# Patient Record
Sex: Female | Born: 1965 | Race: Black or African American | Hispanic: No | Marital: Single | Smoking: Never smoker
Health system: Southern US, Community
[De-identification: ages and names within clinical notes are randomized; demographics above are authoritative.]

## PROBLEM LIST (undated history)

## (undated) ENCOUNTER — Ambulatory Visit (HOSPITAL_COMMUNITY): Disposition: A | Discharge status: Home / Self Care | Payer: No Typology Code available for payment source

## (undated) DIAGNOSIS — E1142 Type 2 diabetes mellitus with diabetic polyneuropathy: Secondary | ICD-10-CM

## (undated) DIAGNOSIS — M7530 Calcific tendinitis of unspecified shoulder: Secondary | ICD-10-CM

## (undated) DIAGNOSIS — M51379 Other intervertebral disc degeneration, lumbosacral region without mention of lumbar back pain or lower extremity pain: Secondary | ICD-10-CM

## (undated) DIAGNOSIS — I5032 Chronic diastolic (congestive) heart failure: Secondary | ICD-10-CM

## (undated) DIAGNOSIS — M545 Low back pain, unspecified: Secondary | ICD-10-CM

## (undated) DIAGNOSIS — Z950 Presence of cardiac pacemaker: Secondary | ICD-10-CM

## (undated) DIAGNOSIS — G4733 Obstructive sleep apnea (adult) (pediatric): Secondary | ICD-10-CM

## (undated) DIAGNOSIS — R0602 Shortness of breath: Secondary | ICD-10-CM

## (undated) DIAGNOSIS — R569 Unspecified convulsions: Secondary | ICD-10-CM

## (undated) DIAGNOSIS — E785 Hyperlipidemia, unspecified: Secondary | ICD-10-CM

## (undated) DIAGNOSIS — IMO0002 Reserved for concepts with insufficient information to code with codable children: Secondary | ICD-10-CM

## (undated) DIAGNOSIS — E119 Type 2 diabetes mellitus without complications: Secondary | ICD-10-CM

## (undated) DIAGNOSIS — J45909 Unspecified asthma, uncomplicated: Secondary | ICD-10-CM

## (undated) DIAGNOSIS — K219 Gastro-esophageal reflux disease without esophagitis: Secondary | ICD-10-CM

## (undated) DIAGNOSIS — I447 Left bundle-branch block, unspecified: Secondary | ICD-10-CM

## (undated) DIAGNOSIS — M1990 Unspecified osteoarthritis, unspecified site: Secondary | ICD-10-CM

## (undated) DIAGNOSIS — Z95810 Presence of automatic (implantable) cardiac defibrillator: Secondary | ICD-10-CM

## (undated) DIAGNOSIS — I5042 Chronic combined systolic (congestive) and diastolic (congestive) heart failure: Secondary | ICD-10-CM

## (undated) DIAGNOSIS — K769 Liver disease, unspecified: Secondary | ICD-10-CM

## (undated) DIAGNOSIS — I10 Essential (primary) hypertension: Secondary | ICD-10-CM

## (undated) DIAGNOSIS — Z9989 Dependence on other enabling machines and devices: Secondary | ICD-10-CM

## (undated) DIAGNOSIS — G8929 Other chronic pain: Secondary | ICD-10-CM

## (undated) DIAGNOSIS — M169 Osteoarthritis of hip, unspecified: Secondary | ICD-10-CM

## (undated) DIAGNOSIS — I2510 Atherosclerotic heart disease of native coronary artery without angina pectoris: Secondary | ICD-10-CM

## (undated) DIAGNOSIS — N390 Urinary tract infection, site not specified: Secondary | ICD-10-CM

## (undated) DIAGNOSIS — I639 Cerebral infarction, unspecified: Secondary | ICD-10-CM

## (undated) DIAGNOSIS — I428 Other cardiomyopathies: Secondary | ICD-10-CM

## (undated) DIAGNOSIS — M5137 Other intervertebral disc degeneration, lumbosacral region: Secondary | ICD-10-CM

## (undated) HISTORY — DX: Calcific tendinitis of unspecified shoulder: M75.30

## (undated) HISTORY — DX: Reserved for concepts with insufficient information to code with codable children: IMO0002

## (undated) HISTORY — DX: Essential (primary) hypertension: I10

## (undated) HISTORY — DX: Low back pain, unspecified: M54.50

## (undated) HISTORY — DX: Morbid (severe) obesity due to excess calories: E66.01

## (undated) HISTORY — DX: Other chronic pain: G89.29

## (undated) HISTORY — DX: Dependence on other enabling machines and devices: Z99.89

## (undated) HISTORY — DX: Other intervertebral disc degeneration, lumbosacral region without mention of lumbar back pain or lower extremity pain: M51.379

## (undated) HISTORY — DX: Chronic diastolic (congestive) heart failure: I50.32

## (undated) HISTORY — DX: Osteoarthritis of hip, unspecified: M16.9

## (undated) HISTORY — DX: Hyperlipidemia, unspecified: E78.5

## (undated) HISTORY — DX: Obstructive sleep apnea (adult) (pediatric): G47.33

## (undated) HISTORY — DX: Liver disease, unspecified: K76.9

## (undated) HISTORY — PX: CARPAL TUNNEL RELEASE: SHX101

## (undated) HISTORY — DX: Other cardiomyopathies: I42.8

## (undated) HISTORY — DX: Type 2 diabetes mellitus with diabetic polyneuropathy: E11.42

## (undated) HISTORY — DX: Other intervertebral disc degeneration, lumbosacral region: M51.37

## (undated) HISTORY — DX: Gastro-esophageal reflux disease without esophagitis: K21.9

## (undated) HISTORY — PX: MULTIPLE TOOTH EXTRACTIONS: SHX2053

## (undated) HISTORY — DX: Left bundle-branch block, unspecified: I44.7

## (undated) HISTORY — DX: Low back pain: M54.5

---
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
## 1985-05-31 HISTORY — PX: TUBAL LIGATION: SHX77

## 1998-06-29 ENCOUNTER — Emergency Department (HOSPITAL_COMMUNITY): Admission: EM | Admit: 1998-06-29 | Discharge: 1998-06-29 | Payer: Self-pay | Admitting: Emergency Medicine

## 1999-07-31 ENCOUNTER — Emergency Department (HOSPITAL_COMMUNITY): Admission: EM | Admit: 1999-07-31 | Discharge: 1999-07-31 | Payer: Self-pay | Admitting: Emergency Medicine

## 1999-10-21 ENCOUNTER — Emergency Department (HOSPITAL_COMMUNITY): Admission: EM | Admit: 1999-10-21 | Discharge: 1999-10-21 | Payer: Self-pay | Admitting: Emergency Medicine

## 1999-10-27 ENCOUNTER — Encounter: Payer: Self-pay | Admitting: Oral and Maxillofacial Surgery

## 1999-10-27 ENCOUNTER — Emergency Department (HOSPITAL_COMMUNITY): Admission: EM | Admit: 1999-10-27 | Discharge: 1999-10-27 | Payer: Self-pay | Admitting: *Deleted

## 1999-10-27 ENCOUNTER — Ambulatory Visit (HOSPITAL_COMMUNITY): Admission: RE | Admit: 1999-10-27 | Discharge: 1999-10-27 | Payer: Self-pay | Admitting: Oral and Maxillofacial Surgery

## 1999-10-28 ENCOUNTER — Ambulatory Visit: Admission: RE | Admit: 1999-10-28 | Discharge: 1999-10-28 | Payer: Self-pay | Admitting: Emergency Medicine

## 1999-12-18 ENCOUNTER — Encounter: Payer: Self-pay | Admitting: Emergency Medicine

## 1999-12-18 ENCOUNTER — Emergency Department (HOSPITAL_COMMUNITY): Admission: EM | Admit: 1999-12-18 | Discharge: 1999-12-18 | Payer: Self-pay | Admitting: Emergency Medicine

## 2000-11-24 ENCOUNTER — Emergency Department (HOSPITAL_COMMUNITY): Admission: EM | Admit: 2000-11-24 | Discharge: 2000-11-24 | Payer: Self-pay

## 2000-11-24 ENCOUNTER — Encounter: Payer: Self-pay | Admitting: Emergency Medicine

## 2003-12-17 ENCOUNTER — Emergency Department (HOSPITAL_COMMUNITY): Admission: EM | Admit: 2003-12-17 | Discharge: 2003-12-17 | Payer: Self-pay | Admitting: Emergency Medicine

## 2004-03-31 ENCOUNTER — Emergency Department (HOSPITAL_COMMUNITY): Admission: EM | Admit: 2004-03-31 | Discharge: 2004-03-31 | Payer: Self-pay | Admitting: Family Medicine

## 2004-04-12 ENCOUNTER — Emergency Department (HOSPITAL_COMMUNITY): Admission: EM | Admit: 2004-04-12 | Discharge: 2004-04-13 | Payer: Self-pay | Admitting: Emergency Medicine

## 2004-04-18 ENCOUNTER — Emergency Department (HOSPITAL_COMMUNITY): Admission: EM | Admit: 2004-04-18 | Discharge: 2004-04-19 | Payer: Self-pay | Admitting: Emergency Medicine

## 2004-04-30 ENCOUNTER — Ambulatory Visit (HOSPITAL_COMMUNITY): Admission: RE | Admit: 2004-04-30 | Discharge: 2004-04-30 | Payer: Self-pay | Admitting: Specialist

## 2004-07-09 ENCOUNTER — Emergency Department (HOSPITAL_COMMUNITY): Admission: EM | Admit: 2004-07-09 | Discharge: 2004-07-09 | Payer: Self-pay | Admitting: Emergency Medicine

## 2004-12-05 ENCOUNTER — Emergency Department (HOSPITAL_COMMUNITY): Admission: EM | Admit: 2004-12-05 | Discharge: 2004-12-06 | Payer: Self-pay | Admitting: Emergency Medicine

## 2005-05-06 ENCOUNTER — Emergency Department (HOSPITAL_COMMUNITY): Admission: EM | Admit: 2005-05-06 | Discharge: 2005-05-07 | Payer: Self-pay | Admitting: Emergency Medicine

## 2005-05-07 ENCOUNTER — Inpatient Hospital Stay (HOSPITAL_COMMUNITY): Admission: AD | Admit: 2005-05-07 | Discharge: 2005-05-07 | Payer: Self-pay | Admitting: Obstetrics & Gynecology

## 2005-05-27 ENCOUNTER — Ambulatory Visit: Payer: Self-pay | Admitting: Obstetrics and Gynecology

## 2005-08-10 ENCOUNTER — Emergency Department (HOSPITAL_COMMUNITY): Admission: EM | Admit: 2005-08-10 | Discharge: 2005-08-10 | Payer: Self-pay | Admitting: Family Medicine

## 2006-01-07 ENCOUNTER — Emergency Department (HOSPITAL_COMMUNITY): Admission: EM | Admit: 2006-01-07 | Discharge: 2006-01-07 | Payer: Self-pay | Admitting: Family Medicine

## 2006-01-22 ENCOUNTER — Emergency Department (HOSPITAL_COMMUNITY): Admission: EM | Admit: 2006-01-22 | Discharge: 2006-01-23 | Payer: Self-pay | Admitting: Emergency Medicine

## 2006-01-24 ENCOUNTER — Ambulatory Visit: Payer: Self-pay | Admitting: *Deleted

## 2006-01-24 ENCOUNTER — Emergency Department (HOSPITAL_COMMUNITY): Admission: EM | Admit: 2006-01-24 | Discharge: 2006-01-25 | Payer: Self-pay | Admitting: Emergency Medicine

## 2006-01-24 ENCOUNTER — Ambulatory Visit: Payer: Self-pay | Admitting: Family Medicine

## 2006-01-26 ENCOUNTER — Ambulatory Visit (HOSPITAL_COMMUNITY): Admission: RE | Admit: 2006-01-26 | Discharge: 2006-01-26 | Payer: Self-pay | Admitting: Family Medicine

## 2006-01-27 ENCOUNTER — Ambulatory Visit: Payer: Self-pay | Admitting: Family Medicine

## 2006-02-03 ENCOUNTER — Ambulatory Visit: Payer: Self-pay | Admitting: Family Medicine

## 2006-02-12 ENCOUNTER — Emergency Department (HOSPITAL_COMMUNITY): Admission: EM | Admit: 2006-02-12 | Discharge: 2006-02-12 | Payer: Self-pay | Admitting: Pediatrics

## 2006-02-13 ENCOUNTER — Emergency Department (HOSPITAL_COMMUNITY): Admission: EM | Admit: 2006-02-13 | Discharge: 2006-02-13 | Payer: Self-pay | Admitting: Pediatrics

## 2006-02-14 ENCOUNTER — Inpatient Hospital Stay (HOSPITAL_COMMUNITY): Admission: EM | Admit: 2006-02-14 | Discharge: 2006-02-16 | Payer: Self-pay | Admitting: Emergency Medicine

## 2006-02-14 ENCOUNTER — Ambulatory Visit: Payer: Self-pay | Admitting: Infectious Diseases

## 2006-04-11 ENCOUNTER — Ambulatory Visit: Payer: Self-pay | Admitting: Family Medicine

## 2006-04-12 ENCOUNTER — Emergency Department (HOSPITAL_COMMUNITY): Admission: EM | Admit: 2006-04-12 | Discharge: 2006-04-12 | Payer: Self-pay | Admitting: Emergency Medicine

## 2006-04-13 ENCOUNTER — Emergency Department (HOSPITAL_COMMUNITY): Admission: EM | Admit: 2006-04-13 | Discharge: 2006-04-13 | Payer: Self-pay | Admitting: Emergency Medicine

## 2006-04-22 ENCOUNTER — Ambulatory Visit: Payer: Self-pay | Admitting: Family Medicine

## 2006-05-10 ENCOUNTER — Emergency Department (HOSPITAL_COMMUNITY): Admission: EM | Admit: 2006-05-10 | Discharge: 2006-05-10 | Payer: Self-pay | Admitting: Emergency Medicine

## 2006-10-03 ENCOUNTER — Ambulatory Visit: Payer: Self-pay | Admitting: Family Medicine

## 2006-12-23 ENCOUNTER — Ambulatory Visit: Payer: Self-pay | Admitting: Nurse Practitioner

## 2007-02-07 ENCOUNTER — Ambulatory Visit: Payer: Self-pay | Admitting: Family Medicine

## 2007-04-18 ENCOUNTER — Ambulatory Visit: Payer: Self-pay | Admitting: Family Medicine

## 2007-04-18 ENCOUNTER — Encounter (INDEPENDENT_AMBULATORY_CARE_PROVIDER_SITE_OTHER): Payer: Self-pay | Admitting: Family Medicine

## 2007-08-18 ENCOUNTER — Ambulatory Visit: Payer: Self-pay | Admitting: Family Medicine

## 2007-08-30 ENCOUNTER — Encounter (INDEPENDENT_AMBULATORY_CARE_PROVIDER_SITE_OTHER): Payer: Self-pay | Admitting: *Deleted

## 2007-10-08 ENCOUNTER — Emergency Department (HOSPITAL_COMMUNITY): Admission: EM | Admit: 2007-10-08 | Discharge: 2007-10-08 | Payer: Self-pay | Admitting: Emergency Medicine

## 2007-11-12 ENCOUNTER — Emergency Department (HOSPITAL_COMMUNITY): Admission: EM | Admit: 2007-11-12 | Discharge: 2007-11-12 | Payer: Self-pay | Admitting: Emergency Medicine

## 2007-11-14 ENCOUNTER — Ambulatory Visit: Payer: Self-pay | Admitting: *Deleted

## 2007-11-14 ENCOUNTER — Inpatient Hospital Stay (HOSPITAL_COMMUNITY): Admission: EM | Admit: 2007-11-14 | Discharge: 2007-11-16 | Payer: Self-pay | Admitting: Emergency Medicine

## 2007-12-12 ENCOUNTER — Ambulatory Visit: Payer: Self-pay | Admitting: Family Medicine

## 2007-12-12 LAB — CONVERTED CEMR LAB
AST: 26 units/L (ref 0–37)
BUN: 7 mg/dL (ref 6–23)
Calcium: 9.8 mg/dL (ref 8.4–10.5)
Chloride: 104 meq/L (ref 96–112)
Free T4: 1.31 ng/dL (ref 0.89–1.80)
Glucose, Bld: 155 mg/dL — ABNORMAL HIGH (ref 70–99)
Sodium: 134 meq/L — ABNORMAL LOW (ref 135–145)
TSH: 2.298 microintl units/mL (ref 0.350–5.50)
Total Protein: 8.2 g/dL (ref 6.0–8.3)

## 2008-05-10 ENCOUNTER — Ambulatory Visit: Payer: Self-pay | Admitting: Family Medicine

## 2008-05-17 ENCOUNTER — Emergency Department (HOSPITAL_COMMUNITY): Admission: EM | Admit: 2008-05-17 | Discharge: 2008-05-18 | Payer: Self-pay | Admitting: Emergency Medicine

## 2008-05-22 ENCOUNTER — Emergency Department (HOSPITAL_COMMUNITY): Admission: EM | Admit: 2008-05-22 | Discharge: 2008-05-23 | Payer: Self-pay | Admitting: Emergency Medicine

## 2008-05-23 ENCOUNTER — Emergency Department (HOSPITAL_COMMUNITY): Admission: EM | Admit: 2008-05-23 | Discharge: 2008-05-23 | Payer: Self-pay | Admitting: Family Medicine

## 2008-06-13 ENCOUNTER — Emergency Department (HOSPITAL_COMMUNITY): Admission: EM | Admit: 2008-06-13 | Discharge: 2008-06-13 | Payer: Self-pay | Admitting: Emergency Medicine

## 2008-06-18 ENCOUNTER — Emergency Department (HOSPITAL_COMMUNITY): Admission: EM | Admit: 2008-06-18 | Discharge: 2008-06-18 | Payer: Self-pay | Admitting: Emergency Medicine

## 2008-06-20 ENCOUNTER — Emergency Department (HOSPITAL_COMMUNITY): Admission: EM | Admit: 2008-06-20 | Discharge: 2008-06-20 | Payer: Self-pay | Admitting: Emergency Medicine

## 2008-07-12 ENCOUNTER — Ambulatory Visit: Payer: Self-pay | Admitting: Family Medicine

## 2008-09-03 ENCOUNTER — Ambulatory Visit: Payer: Self-pay | Admitting: Family Medicine

## 2008-09-11 ENCOUNTER — Ambulatory Visit: Payer: Self-pay | Admitting: Family Medicine

## 2009-01-02 ENCOUNTER — Ambulatory Visit: Payer: Self-pay | Admitting: Family Medicine

## 2009-01-08 ENCOUNTER — Emergency Department (HOSPITAL_COMMUNITY): Admission: EM | Admit: 2009-01-08 | Discharge: 2009-01-08 | Payer: Self-pay | Admitting: Emergency Medicine

## 2009-02-02 ENCOUNTER — Emergency Department (HOSPITAL_COMMUNITY): Admission: EM | Admit: 2009-02-02 | Discharge: 2009-02-02 | Payer: Self-pay | Admitting: Emergency Medicine

## 2009-02-04 ENCOUNTER — Ambulatory Visit: Payer: Self-pay | Admitting: Family Medicine

## 2009-02-04 LAB — CONVERTED CEMR LAB: Microalb, Ur: 1.03 mg/dL (ref 0.00–1.89)

## 2009-02-07 ENCOUNTER — Encounter: Admission: RE | Admit: 2009-02-07 | Discharge: 2009-02-07 | Payer: Self-pay | Admitting: General Practice

## 2009-02-12 ENCOUNTER — Ambulatory Visit (HOSPITAL_COMMUNITY): Admission: RE | Admit: 2009-02-12 | Discharge: 2009-02-12 | Payer: Self-pay | Admitting: Family Medicine

## 2009-03-17 ENCOUNTER — Emergency Department (HOSPITAL_COMMUNITY): Admission: EM | Admit: 2009-03-17 | Discharge: 2009-03-17 | Payer: Self-pay | Admitting: Emergency Medicine

## 2009-03-24 ENCOUNTER — Emergency Department (HOSPITAL_COMMUNITY): Admission: EM | Admit: 2009-03-24 | Discharge: 2009-03-24 | Payer: Self-pay | Admitting: *Deleted

## 2009-04-03 ENCOUNTER — Ambulatory Visit: Payer: Self-pay | Admitting: Family Medicine

## 2009-05-15 ENCOUNTER — Ambulatory Visit: Payer: Self-pay | Admitting: Internal Medicine

## 2009-05-30 ENCOUNTER — Emergency Department (HOSPITAL_COMMUNITY): Admission: EM | Admit: 2009-05-30 | Discharge: 2009-05-30 | Payer: Self-pay | Admitting: Emergency Medicine

## 2009-06-03 ENCOUNTER — Ambulatory Visit: Payer: Self-pay | Admitting: Internal Medicine

## 2009-08-05 ENCOUNTER — Ambulatory Visit: Payer: Self-pay | Admitting: Family Medicine

## 2009-08-05 LAB — CONVERTED CEMR LAB
AST: 16 units/L (ref 0–37)
Albumin: 4.3 g/dL (ref 3.5–5.2)
Calcium: 9.3 mg/dL (ref 8.4–10.5)
Chloride: 100 meq/L (ref 96–112)
Cholesterol: 212 mg/dL — ABNORMAL HIGH (ref 0–200)
Creatinine, Ser: 0.85 mg/dL (ref 0.40–1.20)
Glucose, Bld: 245 mg/dL — ABNORMAL HIGH (ref 70–99)
HDL: 49 mg/dL (ref >39)
LDL Cholesterol: 114 mg/dL — ABNORMAL HIGH (ref 0–99)
Potassium: 4.2 meq/L (ref 3.5–5.3)
Sodium: 137 meq/L (ref 135–145)
TSH: 1.441 microintl units/mL (ref 0.350–4.500)
Total Bilirubin: 0.7 mg/dL (ref 0.3–1.2)

## 2009-09-05 ENCOUNTER — Emergency Department (HOSPITAL_COMMUNITY): Admission: EM | Admit: 2009-09-05 | Discharge: 2009-09-05 | Payer: Self-pay | Admitting: Emergency Medicine

## 2009-09-26 ENCOUNTER — Emergency Department (HOSPITAL_COMMUNITY): Admission: EM | Admit: 2009-09-26 | Discharge: 2009-09-26 | Payer: Self-pay | Admitting: Emergency Medicine

## 2009-12-13 HISTORY — PX: BREAST SURGERY: SHX581

## 2009-12-31 ENCOUNTER — Emergency Department (HOSPITAL_COMMUNITY): Admission: EM | Admit: 2009-12-31 | Discharge: 2009-12-31 | Payer: Self-pay | Admitting: Emergency Medicine

## 2010-01-02 ENCOUNTER — Emergency Department (HOSPITAL_COMMUNITY): Admission: EM | Admit: 2010-01-02 | Discharge: 2010-01-02 | Payer: Self-pay | Admitting: Emergency Medicine

## 2010-02-04 ENCOUNTER — Inpatient Hospital Stay (HOSPITAL_COMMUNITY): Admission: AD | Admit: 2010-02-04 | Discharge: 2010-02-04 | Payer: Self-pay | Admitting: Obstetrics and Gynecology

## 2010-02-10 ENCOUNTER — Emergency Department (HOSPITAL_COMMUNITY): Admission: EM | Admit: 2010-02-10 | Discharge: 2010-02-11 | Payer: Self-pay | Admitting: Emergency Medicine

## 2010-02-12 ENCOUNTER — Ambulatory Visit: Payer: Self-pay | Admitting: Family Medicine

## 2010-02-13 ENCOUNTER — Encounter: Admission: RE | Admit: 2010-02-13 | Discharge: 2010-02-13 | Payer: Self-pay | Admitting: Family Medicine

## 2010-04-06 ENCOUNTER — Ambulatory Visit: Payer: Self-pay | Admitting: Internal Medicine

## 2010-04-14 ENCOUNTER — Ambulatory Visit: Payer: Self-pay | Admitting: Family Medicine

## 2010-04-28 ENCOUNTER — Ambulatory Visit: Payer: Self-pay | Admitting: Internal Medicine

## 2010-05-05 ENCOUNTER — Ambulatory Visit: Payer: Self-pay | Admitting: Family Medicine

## 2010-05-06 ENCOUNTER — Ambulatory Visit: Payer: Self-pay | Admitting: Internal Medicine

## 2010-07-11 ENCOUNTER — Observation Stay (HOSPITAL_COMMUNITY): Admission: EM | Admit: 2010-07-11 | Discharge: 2010-07-12 | Payer: Self-pay | Admitting: Emergency Medicine

## 2010-07-28 ENCOUNTER — Encounter: Payer: Self-pay | Admitting: Cardiology

## 2010-07-28 ENCOUNTER — Ambulatory Visit: Payer: Self-pay | Admitting: Family Medicine

## 2010-08-05 ENCOUNTER — Emergency Department (HOSPITAL_COMMUNITY): Admission: EM | Admit: 2010-08-05 | Discharge: 2010-08-06 | Payer: Self-pay | Admitting: Emergency Medicine

## 2010-08-07 ENCOUNTER — Emergency Department (HOSPITAL_COMMUNITY): Admission: EM | Admit: 2010-08-07 | Discharge: 2010-08-07 | Payer: Self-pay | Admitting: Emergency Medicine

## 2010-08-11 ENCOUNTER — Emergency Department (HOSPITAL_COMMUNITY): Admission: EM | Admit: 2010-08-11 | Discharge: 2010-08-11 | Payer: Self-pay | Admitting: Emergency Medicine

## 2010-08-14 ENCOUNTER — Emergency Department (HOSPITAL_COMMUNITY): Admission: EM | Admit: 2010-08-14 | Discharge: 2010-08-14 | Payer: Self-pay | Admitting: Emergency Medicine

## 2010-08-24 ENCOUNTER — Ambulatory Visit: Payer: Self-pay | Admitting: Internal Medicine

## 2010-08-27 ENCOUNTER — Ambulatory Visit: Payer: Self-pay | Admitting: Cardiology

## 2010-08-27 DIAGNOSIS — I10 Essential (primary) hypertension: Secondary | ICD-10-CM | POA: Insufficient documentation

## 2010-08-27 DIAGNOSIS — Z8679 Personal history of other diseases of the circulatory system: Secondary | ICD-10-CM | POA: Insufficient documentation

## 2010-09-07 ENCOUNTER — Telehealth (INDEPENDENT_AMBULATORY_CARE_PROVIDER_SITE_OTHER): Payer: Self-pay | Admitting: *Deleted

## 2010-09-08 ENCOUNTER — Ambulatory Visit: Payer: Self-pay

## 2010-09-08 ENCOUNTER — Ambulatory Visit: Payer: Self-pay | Admitting: Cardiology

## 2010-09-08 ENCOUNTER — Encounter: Payer: Self-pay | Admitting: Cardiology

## 2010-09-08 ENCOUNTER — Encounter (HOSPITAL_COMMUNITY): Admission: RE | Admit: 2010-09-08 | Discharge: 2010-09-18 | Payer: Self-pay | Admitting: Cardiology

## 2010-09-10 ENCOUNTER — Ambulatory Visit: Payer: Self-pay | Admitting: Cardiology

## 2010-09-15 ENCOUNTER — Ambulatory Visit: Payer: Self-pay | Admitting: Cardiology

## 2010-09-15 ENCOUNTER — Ambulatory Visit (HOSPITAL_COMMUNITY): Admission: RE | Admit: 2010-09-15 | Discharge: 2010-09-15 | Payer: Self-pay | Admitting: Cardiology

## 2010-09-15 ENCOUNTER — Encounter: Payer: Self-pay | Admitting: Cardiology

## 2010-09-15 ENCOUNTER — Ambulatory Visit: Payer: Self-pay

## 2010-11-29 ENCOUNTER — Emergency Department (HOSPITAL_COMMUNITY)
Admission: EM | Admit: 2010-11-29 | Discharge: 2010-11-29 | Payer: Self-pay | Source: Home / Self Care | Admitting: Emergency Medicine

## 2010-12-16 ENCOUNTER — Emergency Department (HOSPITAL_COMMUNITY)
Admission: EM | Admit: 2010-12-16 | Discharge: 2010-12-16 | Payer: Self-pay | Source: Home / Self Care | Admitting: Emergency Medicine

## 2010-12-18 ENCOUNTER — Emergency Department (HOSPITAL_COMMUNITY)
Admission: EM | Admit: 2010-12-18 | Discharge: 2010-12-19 | Payer: Self-pay | Source: Home / Self Care | Admitting: Emergency Medicine

## 2011-01-12 NOTE — Letter (Signed)
Summary: [facility] Note  [facility] Note   Imported By: Marylou Mccoy 09/14/2010 13:04:50  _____________________________________________________________________  External Attachment:    Type:   Image     Comment:   External Document

## 2011-01-12 NOTE — Assessment & Plan Note (Signed)
Summary: Cardiology Nuclear Testing  Nuclear Med Background Indications for Stress Test: Evaluation for Ischemia  Indications Comments: Admitted 07/11/10 CP (-) enzymes  History: GXT  History Comments: '96 GXT (-) [place]  Symptoms: Chest Pain, Fatigue, Fatigue with Exertion, Nausea, Palpitations, SOB    Nuclear Pre-Procedure Cardiac Risk Factors: Family History - CAD, Hypertension, NIDDM Caffeine/Decaff Intake: None NPO After: 7:00 PM Lungs: clear IV 0.9% NS with Angio Cath: 22g     IV Site: R Antecubital IV Started by: Irean Hong, RN Chest Size (in) 44     Cup Size D     Height (in): 69 Weight (lb): 288 BMI: 42.68 Tech Comments: Held carvedilol 24 hrs.  Nuclear Med Study 1 or 2 day study:  1 day     Stress Test Type:  Eugenie Birks Reading MD:  Willa Rough, MD     Referring MD:  D.McLean Resting Radionuclide:  Technetium 34m Tetrofosmin     Resting Radionuclide Dose:  11 mCi  Stress Radionuclide:  Technetium 13m Tetrofosmin     Stress Radionuclide Dose:  33 mCi   Stress Protocol   Lexiscan: 0.4 mg   Stress Test Technologist:  Frederick Peers, EMT-P     Nuclear Technologist:  Domenic Polite, CNMT  Rest Procedure  Myocardial perfusion imaging was performed at rest 45 minutes following the intravenous administration of Technetium 65m Tetrofosmin.  Stress Procedure  The patient received IV Lexiscan 0.4 mg over 15-seconds.  Technetium 46m Tetrofosmin injected at 30-seconds.  There were no significant changes with infusion.  Quantitative spect images were obtained after a 45 minute delay.  QPS Raw Data Images:  Normal; no motion artifact; normal heart/lung ratio. Stress Images:  Normal homogeneous uptake in all areas of the myocardium. Rest Images:  Normal homogeneous uptake in all areas of the myocardium. Subtraction (SDS):  No evidence of ischemia. Transient Ischemic Dilatation:  1.15  (Normal <1.22)  Lung/Heart Ratio:  .27  (Normal <0.45)  Quantitative Gated  Spect Images QGS EDV:  120 ml QGS ESV:  60 ml QGS EF:  50 % QGS cine images:  Good motion  Findings Normal nuclear study      Overall Impression  Exercise Capacity: Lexiscan with no exercise. BP Response: Hypertension at rest. Study changed to [place]. Clinical Symptoms: Chest discomfort ECG Impression: No significant ST segment change suggestive of ischemia. Overall Impression: Normal stress nuclear study.  Appended Document: Cardiology Nuclear Testing appt 09/10/10 with Dr Shirlee Latch

## 2011-01-12 NOTE — Assessment & Plan Note (Signed)
Summary: np6/CP/uncontrolled/dm/& htn/ neg enzymes  Medications Added VENTOLIN HFA 108 (90 BASE) MCG/ACT AERS (ALBUTEROL SULFATE) use 2 puffs four times daily METFORMIN HCL 1000 MG TABS (METFORMIN HCL) take one tablet by mouth two times a day AVAPRO 300 MG TABS (IRBESARTAN) take one tablet once daily NYSTATIN-TRIAMCINOLONE 100000-0.1 UNIT/GM-% CREA (NYSTATIN-TRIAMCINOLONE) apply to affected area twice daily CARVEDILOL 3.125 MG TABS (CARVEDILOL) take on tablet two times a day CELEXA 20 MG TABS (CITALOPRAM HYDROBROMIDE) once daily NITROSTAT 0.4 MG SUBL (NITROGLYCERIN) UAD CARDURA 4 MG TABS (DOXAZOSIN MESYLATE) take one tablet once daily ACTOS 15 MG TABS (PIOGLITAZONE HCL) take one tablet once daily CLOTRIMAZOLE 1 % CREA (CLOTRIMAZOLE) apply to groin creases at bedtime NORVASC 5 MG TABS (AMLODIPINE BESYLATE) one and one-half tablets daily GLUCOTROL 10 MG TABS (GLIPIZIDE) take one tablet two times a day PROTONIX 40 MG TBEC (PANTOPRAZOLE SODIUM) take one tablet every morning 30 minutes before a meal VITAMIN D3 50000 UNIT CAPS (CHOLECALCIFEROL) take one capsule once weekly WAVESENSE PRESTO W/DEVICE KIT (BLOOD GLUCOSE MONITORING SUPPL) UAD ASPIRIN 81 MG TBEC (ASPIRIN) once daily      Allergies Added: NKDA  Primary Provider:  [facility]  CC:  new patient.  Pt states she is feeling well.  She has not been SOB or had chest pain for last 2 weeks.  History of Present Illness: 55 you Female with PMH significant for long-standing history of hypertension.    Admitted to hospital due to chest pain.  Describes palpitations followed by sharp stabbing pain that radiated through to her back with concurrent shortness of breath.  CE's negative while in house, CXR also negative.  Discharged with orders for outpatient stress test, at discharge chest pain attributed to stress.  Changed medications while in-house, both hypertensive and oral hypoglycemic agents.    Since leaving hospital, has  followed up with PCP.  Describes some headaches, fatigue, LE swelling relieved with elevation, and weakness since discharge.  No further chest pain or shortness of breath.  Was on lisinopril on discharge, but she has allergic reaction to this.  Had blood pressure medication changed since last PCP appt earlier this week, unsure what she has been started on.  Has not had any chest pain or difficulty breathing, mostly complains of weakness and fatigue.   Has also complained of weight gain, about 30 lbs in only 2 months.  Takes a daily baby aspirin.    Labs (8/11): LDL 83, HDL 40  Patient was seen with resident MD Payton Mccallum.  I agree with note and plan.   Current Problems (verified): 1)  Chest Pain, Hx of  (ICD-V12.50) 2)  Essential Hypertension  (ICD-401.9)  Current Medications (verified): 1)  Ventolin Hfa 108 (90 Base) Mcg/act Aers (Albuterol Sulfate) .... Use 2 Puffs Four Times Daily 2)  Metformin Hcl 1000 Mg Tabs (Metformin Hcl) .... Take One Tablet By Mouth Two Times A Day 3)  Avapro 300 Mg Tabs (Irbesartan) .... Take One Tablet Once Daily 4)  Nystatin-Triamcinolone 100000-0.1 Unit/gm-% Crea (Nystatin-Triamcinolone) .... Apply To Affected Area Twice Daily 5)  Carvedilol 3.125 Mg Tabs (Carvedilol) .... Take On Tablet Two Times A Day 6)  Celexa 20 Mg Tabs (Citalopram Hydrobromide) .... Once Daily 7)  Nitrostat 0.4 Mg Subl (Nitroglycerin) .... Uad 8)  Cardura 4 Mg Tabs (Doxazosin Mesylate) .... Take One Tablet Once Daily 9)  Actos 15 Mg Tabs (Pioglitazone Hcl) .... Take One Tablet Once Daily 10)  Clotrimazole 1 % Crea (Clotrimazole) .... Apply To Groin Creases At Bedtime 11)  Norvasc 5 Mg Tabs (Amlodipine Besylate) .... One and One-Half Tablets Daily 12)  Glucotrol 10 Mg Tabs (Glipizide) .... Take One Tablet Two Times A Day 13)  Protonix 40 Mg Tbec (Pantoprazole Sodium) .... Take One Tablet Every Morning 30 Minutes Before A Meal 14)  Vitamin D3 50000 Unit Caps (Cholecalciferol) .... Take  One Capsule Once Weekly 15)  Wavesense Presto W/device Kit (Blood Glucose Monitoring Suppl) .... Uad 16)  Aspirin 81 Mg Tbec (Aspirin) .... Once Daily  Allergies (verified): No Known Drug Allergies  Past History:  Past medical, surgical, family and social histories (including risk factors) reviewed, and no changes noted (except as noted below).  Past Medical History: HTN - 26 year history  DM - diagnosed 2002 Obesity Chronic nausea, vomiting, and abdominal  Stress test performed in [facility] 1996, negative  Past Surgical History: Unknown tumor removal upper palate 2003 Total tooth removal, upper teeth.  Sept, 2011  Family History: Reviewed history from 08/24/2010 and no changes required. Mother - HTN, CHF, chronic kidney disease on dialysis.  Deceased 66 age 45 secondary to complications from CHF and dialysis. Father - HTN, DM, s/p 5 stents placed, s/p MI in early 4's Brother - DM, HTN, MI at age 36  Children in good health  Social History: Reviewed history from 08/24/2010 and no changes required. She is single with children and grandchildren.  Not employed, attempting to obtain disability   She denied tobacco, alcohol or drug use  Review of Systems       The patient complains of fatigue, weight gain/loss, and leg swelling.  The patient denies malaise, fever, vision loss, decreased hearing, hoarseness, chest pain, palpitations, shortness of breath, prolonged cough, wheezing, sleep apnea, coughing up blood, abdominal pain, blood in stool, nausea, vomiting, diarrhea, heartburn, incontinence, blood in urine, muscle weakness, joint pain, rash, skin lesions, headache, fainting, dizziness, depression, anxiety, enlarged lymph nodes, easy bruising or bleeding, and environmental allergies.    Vital Signs:  Patient profile:   45 year old female Height:      69 inches Weight:      289 pounds BMI:     42.83 Pulse rate:   96 / minute Pulse rhythm:   regular BP sitting:   159 /  97  (left arm) Cuff size:   large  Vitals Entered By: Judithe Modest CMA (August 27, 2010 11:47 AM)  Physical Exam  Additional Exam:  Gen: Obese female in NAD.  Conversant and interactive.   HEENT: NCAT, PERRL, EOMI, no conjunctival inflammation, pharynx nl without erythema or exudates, MMM. External ear exam normal - TMs without bulging or erythema, good landmarks.  No nasal d/c Neck: No LAN, thyromegaly, masses, bruits CV: Regular rate and rhythm.   Lungs: Normal respiratory effort. CTAB no wheezes, rales, or rhonchi noted Abd: soft, obese, nontender.  BS+ and normoactive.  No hepatosplenomegaly.  No masses. Ext: warm and well perfused.  No edema or cyanosis. Skin: No rashes or lesions on visible skin. Neuro:  Alert and oriented x 3.  CN II-XII intact.  Sensation intact throughout upper and lower extremities bilaterally.  Motor function intact throughout.  No focal deficits.  DTR's +2 bilateral ankle jerk and patellar reflexes.  Gait exam WNL, finger-to-nose WNL. Musculoskeletal:  Strength 5/5 bilateral upper and lower extremities Psych:  No suicidal or homicidal ideations.  Not depressed or anxious appearing.      Impression & Recommendations:  Problem # 1:  CHEST PAIN, HX OF (ICD-V12.50) No further episodes since leaving  hospital.  Less likelihood that chest pain on admission was cardiac in origin, but patient does indeed have risk factors including very strong family history of premature CAD, long-standing HTN, and DM.  Plan for Myoview stress test and follow-up with Dr. Shirlee Latch in 2 weeks.  She can continue ASA 81 mg daily for now.   Problem # 2:  ESSENTIAL HYPERTENSION (ICD-401.9) Long-standing problem for this patient.  Plan to stop Calan and start Norvasc 7.5 mg daily to better control HTN.  Will review BP again when she follows up.   Other Orders: Nuclear Stress Test (Nuc Stress Test)  Patient Instructions: 1)  Your physician has recommended you make the following change  in your medication:  2)  Stop Calan. 3)  Start Norvasc 7.5mg  daily--this will be one and one-half 5mg  tablets daily. 4)  Your physician has requested that you have an exercise stress myoview.  For further information please visit [email or web address]  Please follow instruction sheet, as given. 5)  Your physician recommends that you schedule a follow-up appointment in: about  2 weeks with Dr Shirlee Latch. Prescriptions: NORVASC 5 MG TABS (AMLODIPINE BESYLATE) one and one-half tablets daily  #45 x 6   Entered by:   Katina Dung, RN, BSN   Authorized by:   Marca Ancona, MD   Signed by:   Katina Dung, RN, BSN on 08/27/2010   Method used:   Faxed to ...       [facility] (retail)       [address] [place], [place]  [address]       Ph: [phone]       Fax: [phone]   RxID:   [id number]

## 2011-01-12 NOTE — Assessment & Plan Note (Signed)
Summary: OK PER AMME/D.MILLER  Medications Added COREG 12.5 MG TABS (CARVEDILOL) two times a day NORVASC 10 MG TABS (AMLODIPINE BESYLATE) oen daily AMITIZA 8 MCG CAPS (LUBIPROSTONE) once daily      Allergies Added:   Visit Type:  ov Primary Provider:  [facility]  CC:  headache, dizziness, and some CP.  History of Present Illness: 45 yo with history of DM and HTN was seen recently for chest pain.  She was set up for Steffanie Dunn to assess for ischemia or infarction.  This study showed no evidence of ischemia or infarction but EF was mildly decreased at 50%.  She has not had further chest pain.  Main complaint is fatigue and soreness in her legs.  The leg soreness tends to be present at rest and not with exertion.   Labs (8/11): LDL 83, HDL 40 Labs (9/11): TSH normal    Current Medications (verified): 1)  Ventolin Hfa 108 (90 Base) Mcg/act Aers (Albuterol Sulfate) .... Use 2 Puffs Four Times Daily 2)  Metformin Hcl 1000 Mg Tabs (Metformin Hcl) .... Take One Tablet By Mouth Two Times A Day 3)  Avapro 300 Mg Tabs (Irbesartan) .... Take One Tablet Once Daily 4)  Nystatin-Triamcinolone 100000-0.1 Unit/gm-% Crea (Nystatin-Triamcinolone) .... Apply To Affected Area Twice Daily 5)  Coreg 12.5 Mg Tabs (Carvedilol) .... Two Times A Day 6)  Celexa 20 Mg Tabs (Citalopram Hydrobromide) .... Once Daily 7)  Nitrostat 0.4 Mg Subl (Nitroglycerin) .... Uad 8)  Cardura 4 Mg Tabs (Doxazosin Mesylate) .... Take One Tablet Once Daily 9)  Clotrimazole 1 % Crea (Clotrimazole) .... Apply To Groin Creases At Bedtime 10)  Norvasc 5 Mg Tabs (Amlodipine Besylate) .... One and One-Half Tablets Daily 11)  Glucotrol 10 Mg Tabs (Glipizide) .... Take One Tablet Two Times A Day 12)  Protonix 40 Mg Tbec (Pantoprazole Sodium) .... Take One Tablet Every Morning 30 Minutes Before A Meal 13)  Vitamin D3 50000 Unit Caps (Cholecalciferol) .... Take One Capsule Once Weekly 14)  Wavesense Presto W/device Kit  (Blood Glucose Monitoring Suppl) .... Uad 15)  Aspirin 81 Mg Tbec (Aspirin) .... Once Daily 16)  Amitiza 8 Mcg Caps (Lubiprostone) .... Once Daily  Allergies (verified): 1)  ! * Lisinopril 2)  ! Enalapril Maleate 3)  ! * Clindamycin  Past History:  Past Medical History: 1. HTN - 26 year history  2. DM - diagnosed 2002 3. Obesity 4. Chronic nausea, vomiting, and abdominal  5. Chest pain: Likely noncardiac.  Had stress test performed in [place] in 1996 that was reportedly negative.  Lexiscan-myoview (9/11) showed EF 50%, no evidence for ischemia or infarction.   Family History: Reviewed history from 08/27/2010 and no changes required. Mother - HTN, CHF, chronic kidney disease on dialysis.  Deceased 89 age 74 secondary to complications from CHF and dialysis. Father - HTN, DM, s/p 5 stents placed, s/p MI in early 103's Brother - DM, HTN, MI at age 75  Children in good health  Social History: Reviewed history from 08/27/2010 and no changes required. She is single with children and grandchildren.  Not employed, attempting to obtain disability   She denied tobacco, alcohol or drug use  Review of Systems       All systems reviewed and negative except as per HPI.   Vital Signs:  Patient profile:   45 year old female Height:      69 inches Weight:      292.75 pounds BMI:     43.39 Pulse rate:  92 / minute BP sitting:   122 / 90  (left arm) Cuff size:   large  Vitals Entered By: Caralee Ates CMA (September 10, 2010 11:13 AM)  Physical Exam  General:  Well developed, well nourished, in no acute distress. Obese.  Neck:  Neck supple, no JVD. No masses, thyromegaly or abnormal cervical nodes. Lungs:  Clear bilaterally to auscultation and percussion. Heart:  Non-displaced PMI, chest non-tender; regular rate and rhythm, S1, S2 without murmurs, rubs or gallops. Carotid upstroke normal, no bruit.  Pedals normal pulses. No edema, no varicosities. Abdomen:  Bowel sounds positive;  abdomen soft and non-tender without masses, organomegaly, or hernias noted. No hepatosplenomegaly. Extremities:  No clubbing or cyanosis. Neurologic:  Alert and oriented x 3. Psych:  Normal affect.   Impression & Recommendations:  Problem # 1:  CHEST PAIN, HX OF (ICD-V12.50) No evidence of ischemia or infarction on myoview.  She does have risk factors for CAD (HTN and DM), but I suspect that the pain was noncardiac.  Of note, EF was reported at 50% on myoview.  I will get an echo to reassess EF to see if there is truly mild hypokinesis or if function is actually normal.   Problem # 2:  ESSENTIAL HYPERTENSION (ICD-401.9) BP still has been running high.  Will increase Norvasc to 10 mg daily.  She will followup with Dr. Audria Nine.   Other Orders: Echocardiogram (Echo) TLB-TSH (Thyroid Stimulating Hormone) [phone])  Patient Instructions: 1)  Your physician has recommended you make the following change in your medication:  2)  Increase Norvasc to 10mg  daily--you can take two 5mg  tablets daily. 3)  Lab today--TSH 786.50 4)  Your physician has requested that you have an echocardiogram.  Echocardiography is a painless test that uses sound waves to create images of your heart. It provides your doctor with information about the size and shape of your heart and how well your heart's chambers and valves are working.  This procedure takes approximately one hour. There are no restrictions for this procedure. 5)  Your physician recommends that you schedule a follow-up appointment as needed with Dr Shirlee Latch. Prescriptions: NORVASC 10 MG TABS (AMLODIPINE BESYLATE) oen daily  #30 x 11   Entered by:   Katina Dung, RN, BSN   Authorized by:   Marca Ancona, MD   Signed by:   Katina Dung, RN, BSN on 09/10/2010   Method used:   Faxed to ...       [facility] (retail)       [address] [place], [place]  [address]       Ph: [phone]       Fax:  [phone]   RxID:   [id number]

## 2011-01-12 NOTE — Progress Notes (Signed)
Summary: Nuclear Pre-Procedure  Phone Note Outgoing Call   Call placed by: Milana Na, EMT-P,  September 07, 2010 4:01 PM Summary of Call: Reviewed information on Myoview Information Sheet (see scanned document for further details).  Spoke with patient.     Nuclear Med Background Indications for Stress Test: Evaluation for Ischemia  Indications Comments: Admitted 07/11/10 CP (-) enzymes  History: GXT  History Comments: '96 GXT (-) [place]  Symptoms: Chest Pain, Fatigue, Fatigue with Exertion, Nausea, Palpitations, SOB    Nuclear Pre-Procedure Cardiac Risk Factors: Family History - CAD, Hypertension, NIDDM Height (in): 69  Nuclear Med Study Referring MD:  D.McLean

## 2011-02-25 LAB — COMPREHENSIVE METABOLIC PANEL
Albumin: 4 g/dL (ref 3.5–5.2)
BUN: 6 mg/dL (ref 6–23)
CO2: 24 mEq/L (ref 19–32)
Creatinine, Ser: 0.76 mg/dL (ref 0.4–1.2)
GFR calc non Af Amer: >60 mL/min (ref >60)

## 2011-02-25 LAB — POCT CARDIAC MARKERS
CKMB, poc: <1 ng/mL — ABNORMAL LOW (ref 1.0–8.0)
CKMB, poc: <1 ng/mL — ABNORMAL LOW (ref 1.0–8.0)
Myoglobin, poc: 28.3 ng/mL (ref 12–200)
Troponin i, poc: <0.05 ng/mL (ref 0.00–0.09)
Troponin i, poc: <0.05 ng/mL (ref 0.00–0.09)

## 2011-02-25 LAB — LIPASE, BLOOD: Lipase: 16 U/L (ref 11–59)

## 2011-02-25 LAB — CBC
HCT: 37.9 % (ref 36.0–46.0)
MCH: 28 pg (ref 26.0–34.0)
RBC: 4.54 MIL/uL (ref 3.87–5.11)

## 2011-02-25 LAB — DIFFERENTIAL
Basophils Absolute: 0 10*3/uL (ref 0.0–0.1)
Eosinophils Absolute: 0 10*3/uL (ref 0.0–0.7)
Lymphs Abs: 1.5 10*3/uL (ref 0.7–4.0)
Monocytes Relative: 5 % (ref 3–12)
Neutro Abs: 11.5 10*3/uL — ABNORMAL HIGH (ref 1.7–7.7)

## 2011-02-26 LAB — DIFFERENTIAL
Basophils Absolute: 0 10*3/uL (ref 0.0–0.1)
Eosinophils Relative: 1 % (ref 0–5)
Lymphocytes Relative: 38 % (ref 12–46)
Lymphs Abs: 4.1 10*3/uL — ABNORMAL HIGH (ref 0.7–4.0)
Monocytes Absolute: 0.8 10*3/uL (ref 0.1–1.0)
Neutro Abs: 5.7 10*3/uL (ref 1.7–7.7)

## 2011-02-26 LAB — BASIC METABOLIC PANEL
BUN: 3 mg/dL — ABNORMAL LOW (ref 6–23)
Chloride: 98 mEq/L (ref 96–112)
GFR calc Af Amer: >60 mL/min (ref >60)
GFR calc non Af Amer: >60 mL/min (ref >60)
Potassium: 3.6 mEq/L (ref 3.5–5.1)
Sodium: 132 mEq/L — ABNORMAL LOW (ref 135–145)

## 2011-02-26 LAB — CBC
HCT: 39.8 % (ref 36.0–46.0)
MCV: 81.6 fL (ref 78.0–100.0)
RBC: 4.88 MIL/uL (ref 3.87–5.11)
RDW: 12.7 % (ref 11.5–15.5)
WBC: 10.7 10*3/uL — ABNORMAL HIGH (ref 4.0–10.5)

## 2011-02-26 LAB — HEPATIC FUNCTION PANEL
ALT: 23 U/L (ref 0–35)
AST: 24 U/L (ref 0–37)
Alkaline Phosphatase: 82 U/L (ref 39–117)
Indirect Bilirubin: 0.6 mg/dL (ref 0.3–0.9)
Total Protein: 7.5 g/dL (ref 6.0–8.3)

## 2011-02-26 LAB — GLUCOSE, CAPILLARY: Glucose-Capillary: 365 mg/dL — ABNORMAL HIGH (ref 70–99)

## 2011-02-27 LAB — TROPONIN I
Troponin I: 0.01 ng/mL (ref 0.00–0.06)
Troponin I: 0.01 ng/mL (ref 0.00–0.06)

## 2011-02-27 LAB — DIFFERENTIAL
Basophils Relative: 1 % (ref 0–1)
Eosinophils Relative: 2 % (ref 0–5)
Monocytes Absolute: 0.8 10*3/uL (ref 0.1–1.0)
Monocytes Relative: 9 % (ref 3–12)
Neutro Abs: 3.9 10*3/uL (ref 1.7–7.7)

## 2011-02-27 LAB — GLUCOSE, CAPILLARY
Glucose-Capillary: 249 mg/dL — ABNORMAL HIGH (ref 70–99)
Glucose-Capillary: 255 mg/dL — ABNORMAL HIGH (ref 70–99)
Glucose-Capillary: 280 mg/dL — ABNORMAL HIGH (ref 70–99)
Glucose-Capillary: 322 mg/dL — ABNORMAL HIGH (ref 70–99)

## 2011-02-27 LAB — URINALYSIS, ROUTINE W REFLEX MICROSCOPIC
Bilirubin Urine: NEGATIVE
Glucose, UA: >1000 mg/dL — AB
Specific Gravity, Urine: 1.024 (ref 1.005–1.030)

## 2011-02-27 LAB — CBC
HCT: 36.4 % (ref 36.0–46.0)
Hemoglobin: 12 g/dL (ref 12.0–15.0)
MCH: 28.3 pg (ref 26.0–34.0)
MCHC: 33 g/dL (ref 30.0–36.0)
MCV: 85.9 fL (ref 78.0–100.0)
Platelets: 194 K/uL (ref 150–400)
RBC: 4.23 MIL/uL (ref 3.87–5.11)
RDW: 13.1 % (ref 11.5–15.5)
WBC: 8.9 K/uL (ref 4.0–10.5)

## 2011-02-27 LAB — D-DIMER, QUANTITATIVE: D-Dimer, Quant: 0.45 ug/mL-FEU (ref 0.00–0.48)

## 2011-02-27 LAB — COMPREHENSIVE METABOLIC PANEL WITH GFR
ALT: 21 U/L (ref 0–35)
AST: 19 U/L (ref 0–37)
Albumin: 3.4 g/dL — ABNORMAL LOW (ref 3.5–5.2)
Alkaline Phosphatase: 71 U/L (ref 39–117)
BUN: 9 mg/dL (ref 6–23)
CO2: 30 meq/L (ref 19–32)
Calcium: 8.8 mg/dL (ref 8.4–10.5)
Chloride: 98 meq/L (ref 96–112)
Creatinine, Ser: 0.93 mg/dL (ref 0.4–1.2)
GFR calc non Af Amer: >60 mL/min
Glucose, Bld: 260 mg/dL — ABNORMAL HIGH (ref 70–99)
Potassium: 3.7 meq/L (ref 3.5–5.1)
Sodium: 135 meq/L (ref 135–145)
Total Bilirubin: 0.5 mg/dL (ref 0.3–1.2)
Total Protein: 6.6 g/dL (ref 6.0–8.3)

## 2011-02-27 LAB — URINE MICROSCOPIC-ADD ON

## 2011-02-27 LAB — URINE CULTURE

## 2011-02-27 LAB — T4, FREE: Free T4: 1.45 ng/dL (ref 0.80–1.80)

## 2011-02-27 LAB — RAPID URINE DRUG SCREEN, HOSP PERFORMED
Amphetamines: NOT DETECTED
Barbiturates: NOT DETECTED
Benzodiazepines: NOT DETECTED
Opiates: NOT DETECTED
Tetrahydrocannabinol: NOT DETECTED

## 2011-02-27 LAB — LIPID PANEL
LDL Cholesterol: 83 mg/dL (ref 0–99)
Total CHOL/HDL Ratio: 4.4 RATIO
Triglycerides: 269 mg/dL — ABNORMAL HIGH (ref <150)
VLDL: 54 mg/dL — ABNORMAL HIGH (ref 0–40)

## 2011-02-27 LAB — POCT I-STAT, CHEM 8
BUN: 4 mg/dL — ABNORMAL LOW (ref 6–23)
Chloride: 100 mEq/L (ref 96–112)
HCT: 42 % (ref 36.0–46.0)
Sodium: 137 mEq/L (ref 135–145)

## 2011-02-27 LAB — CK TOTAL AND CKMB (NOT AT ARMC)
CK, MB: 0.9 ng/mL (ref 0.3–4.0)
Total CK: 98 U/L (ref 7–177)

## 2011-02-27 LAB — TSH: TSH: 1.321 u[IU]/mL (ref 0.350–4.500)

## 2011-02-27 LAB — POCT CARDIAC MARKERS
CKMB, poc: <1 ng/mL — ABNORMAL LOW (ref 1.0–8.0)
Troponin i, poc: <0.05 ng/mL (ref 0.00–0.09)
Troponin i, poc: <0.05 ng/mL (ref 0.00–0.09)

## 2011-02-27 LAB — POCT PREGNANCY, URINE: Preg Test, Ur: NEGATIVE

## 2011-02-27 LAB — MAGNESIUM: Magnesium: 1.8 mg/dL (ref 1.5–2.5)

## 2011-03-01 LAB — POCT I-STAT, CHEM 8
BUN: 4 mg/dL — ABNORMAL LOW (ref 6–23)
Chloride: 99 mEq/L (ref 96–112)
Creatinine, Ser: 0.8 mg/dL (ref 0.4–1.2)
Glucose, Bld: 313 mg/dL — ABNORMAL HIGH (ref 70–99)
Potassium: 4 mEq/L (ref 3.5–5.1)

## 2011-03-01 LAB — COMPREHENSIVE METABOLIC PANEL
ALT: 22 U/L (ref 0–35)
Alkaline Phosphatase: 59 U/L (ref 39–117)
CO2: 23 mEq/L (ref 19–32)
GFR calc non Af Amer: >60 mL/min (ref >60)
Glucose, Bld: 309 mg/dL — ABNORMAL HIGH (ref 70–99)
Potassium: 3.4 mEq/L — ABNORMAL LOW (ref 3.5–5.1)
Sodium: 133 mEq/L — ABNORMAL LOW (ref 135–145)

## 2011-03-01 LAB — POCT CARDIAC MARKERS

## 2011-03-08 LAB — CBC
HCT: 37.8 % (ref 36.0–46.0)
MCV: 85 fL (ref 78.0–100.0)
RBC: 4.45 MIL/uL (ref 3.87–5.11)
WBC: 7.9 10*3/uL (ref 4.0–10.5)

## 2011-03-08 LAB — BASIC METABOLIC PANEL
Chloride: 101 mEq/L (ref 96–112)
Creatinine, Ser: 0.88 mg/dL (ref 0.4–1.2)
GFR calc Af Amer: >60 mL/min (ref >60)
Potassium: 3.3 mEq/L — ABNORMAL LOW (ref 3.5–5.1)

## 2011-03-08 LAB — DIFFERENTIAL
Eosinophils Absolute: 0.1 10*3/uL (ref 0.0–0.7)
Lymphs Abs: 2.9 10*3/uL (ref 0.7–4.0)
Monocytes Relative: 8 % (ref 3–12)
Neutrophils Relative %: 53 % (ref 43–77)

## 2011-03-08 LAB — GLUCOSE, CAPILLARY: Comment 1: 313701

## 2011-03-08 LAB — POCT CARDIAC MARKERS
CKMB, poc: <1 ng/mL — ABNORMAL LOW (ref 1.0–8.0)
Myoglobin, poc: 50 ng/mL (ref 12–200)
Troponin i, poc: <0.05 ng/mL (ref 0.00–0.09)

## 2011-03-10 ENCOUNTER — Emergency Department (HOSPITAL_COMMUNITY)
Admission: EM | Admit: 2011-03-10 | Discharge: 2011-03-10 | Disposition: A | Discharge status: Home / Self Care | Payer: Self-pay | Attending: Emergency Medicine | Admitting: Emergency Medicine

## 2011-03-10 DIAGNOSIS — E119 Type 2 diabetes mellitus without complications: Secondary | ICD-10-CM | POA: Insufficient documentation

## 2011-03-10 DIAGNOSIS — R229 Localized swelling, mass and lump, unspecified: Secondary | ICD-10-CM | POA: Insufficient documentation

## 2011-03-10 DIAGNOSIS — I10 Essential (primary) hypertension: Secondary | ICD-10-CM | POA: Insufficient documentation

## 2011-03-10 DIAGNOSIS — R51 Headache: Secondary | ICD-10-CM | POA: Insufficient documentation

## 2011-03-10 DIAGNOSIS — Z79899 Other long term (current) drug therapy: Secondary | ICD-10-CM | POA: Insufficient documentation

## 2011-03-10 DIAGNOSIS — M549 Dorsalgia, unspecified: Secondary | ICD-10-CM | POA: Insufficient documentation

## 2011-03-18 LAB — RAPID STREP SCREEN (MED CTR MEBANE ONLY): Streptococcus, Group A Screen (Direct): NEGATIVE

## 2011-03-18 LAB — URINE MICROSCOPIC-ADD ON

## 2011-03-18 LAB — URINALYSIS, ROUTINE W REFLEX MICROSCOPIC
Bilirubin Urine: NEGATIVE
Nitrite: NEGATIVE
Specific Gravity, Urine: 1.031 — ABNORMAL HIGH (ref 1.005–1.030)
pH: 7.5 (ref 5.0–8.0)

## 2011-03-19 LAB — COMPREHENSIVE METABOLIC PANEL
ALT: 24 U/L (ref 0–35)
AST: 25 U/L (ref 0–37)
Alkaline Phosphatase: 73 U/L (ref 39–117)
CO2: 28 mEq/L (ref 19–32)
Calcium: 9.1 mg/dL (ref 8.4–10.5)
GFR calc Af Amer: >60 mL/min (ref >60)
GFR calc non Af Amer: >60 mL/min (ref >60)
Glucose, Bld: 292 mg/dL — ABNORMAL HIGH (ref 70–99)
Potassium: 3.6 mEq/L (ref 3.5–5.1)
Sodium: 135 mEq/L (ref 135–145)
Total Protein: 7.6 g/dL (ref 6.0–8.3)

## 2011-03-19 LAB — GLUCOSE, CAPILLARY: Glucose-Capillary: 351 mg/dL — ABNORMAL HIGH (ref 70–99)

## 2011-03-19 LAB — CBC
HCT: 39.2 % (ref 36.0–46.0)
MCHC: 33.1 g/dL (ref 30.0–36.0)
MCV: 84.9 fL (ref 78.0–100.0)
Platelets: 235 10*3/uL (ref 150–400)
RDW: 13.4 % (ref 11.5–15.5)

## 2011-03-19 LAB — DIFFERENTIAL
Basophils Absolute: 0 10*3/uL (ref 0.0–0.1)
Basophils Relative: 0 % (ref 0–1)
Eosinophils Absolute: 0.1 10*3/uL (ref 0.0–0.7)
Eosinophils Relative: 1 % (ref 0–5)
Lymphocytes Relative: 38 % (ref 12–46)
Monocytes Absolute: 0.9 10*3/uL (ref 0.1–1.0)

## 2011-03-19 LAB — URINALYSIS, ROUTINE W REFLEX MICROSCOPIC
Bilirubin Urine: NEGATIVE
Hgb urine dipstick: NEGATIVE
Ketones, ur: NEGATIVE mg/dL
Nitrite: POSITIVE — AB
Urobilinogen, UA: 0.2 mg/dL (ref 0.0–1.0)

## 2011-03-19 LAB — POCT I-STAT, CHEM 8
BUN: 7 mg/dL (ref 6–23)
Calcium, Ion: 1.07 mmol/L — ABNORMAL LOW (ref 1.12–1.32)
Creatinine, Ser: 0.5 mg/dL (ref 0.4–1.2)
Hemoglobin: 14.3 g/dL (ref 12.0–15.0)
Sodium: 135 mEq/L (ref 135–145)
TCO2: 26 mmol/L (ref 0–100)

## 2011-03-19 LAB — D-DIMER, QUANTITATIVE: D-Dimer, Quant: 0.28 ug/mL-FEU (ref 0.00–0.48)

## 2011-03-19 LAB — GC/CHLAMYDIA PROBE AMP, GENITAL
Chlamydia, DNA Probe: NEGATIVE
GC Probe Amp, Genital: NEGATIVE

## 2011-03-19 LAB — POCT CARDIAC MARKERS
CKMB, poc: <1 ng/mL — ABNORMAL LOW (ref 1.0–8.0)
CKMB, poc: <1 ng/mL — ABNORMAL LOW (ref 1.0–8.0)
Myoglobin, poc: 68.8 ng/mL (ref 12–200)
Troponin i, poc: <0.05 ng/mL (ref 0.00–0.09)

## 2011-03-19 LAB — WET PREP, GENITAL: Trich, Wet Prep: NONE SEEN

## 2011-03-22 LAB — GLUCOSE, CAPILLARY

## 2011-03-24 LAB — POCT I-STAT, CHEM 8
Creatinine, Ser: 0.9 mg/dL (ref 0.4–1.2)
HCT: 39 % (ref 36.0–46.0)
Hemoglobin: 13.3 g/dL (ref 12.0–15.0)
Sodium: 132 mEq/L — ABNORMAL LOW (ref 135–145)
TCO2: 24 mmol/L (ref 0–100)

## 2011-03-24 LAB — COMPREHENSIVE METABOLIC PANEL
AST: 27 U/L (ref 0–37)
Albumin: 3.5 g/dL (ref 3.5–5.2)
Alkaline Phosphatase: 89 U/L (ref 39–117)
BUN: 6 mg/dL (ref 6–23)
CO2: 25 mEq/L (ref 19–32)
Chloride: 98 mEq/L (ref 96–112)
GFR calc non Af Amer: >60 mL/min (ref >60)
Potassium: 3.9 mEq/L (ref 3.5–5.1)
Total Bilirubin: 0.8 mg/dL (ref 0.3–1.2)

## 2011-03-24 LAB — URINE CULTURE

## 2011-03-24 LAB — POCT PREGNANCY, URINE: Preg Test, Ur: NEGATIVE

## 2011-03-24 LAB — LIPASE, BLOOD: Lipase: 25 U/L (ref 11–59)

## 2011-03-24 LAB — DIFFERENTIAL
Basophils Absolute: 0.1 10*3/uL (ref 0.0–0.1)
Basophils Relative: 1 % (ref 0–1)
Eosinophils Absolute: 0.2 10*3/uL (ref 0.0–0.7)
Neutro Abs: 6.5 10*3/uL (ref 1.7–7.7)
Neutrophils Relative %: 62 % (ref 43–77)

## 2011-03-24 LAB — URINALYSIS, ROUTINE W REFLEX MICROSCOPIC
Glucose, UA: >1000 mg/dL — AB
Nitrite: POSITIVE — AB
Specific Gravity, Urine: 1.029 (ref 1.005–1.030)
pH: 6 (ref 5.0–8.0)

## 2011-03-24 LAB — POCT CARDIAC MARKERS
Myoglobin, poc: 34.7 ng/mL (ref 12–200)
Troponin i, poc: <0.05 ng/mL (ref 0.00–0.09)

## 2011-03-24 LAB — URINE MICROSCOPIC-ADD ON

## 2011-03-24 LAB — CBC
HCT: 36.7 % (ref 36.0–46.0)
MCV: 84.8 fL (ref 78.0–100.0)
Platelets: 220 10*3/uL (ref 150–400)
RBC: 4.33 MIL/uL (ref 3.87–5.11)
WBC: 10.3 10*3/uL (ref 4.0–10.5)

## 2011-03-29 LAB — COMPREHENSIVE METABOLIC PANEL
AST: 24 U/L (ref 0–37)
Albumin: 3.8 g/dL (ref 3.5–5.2)
Calcium: 9.5 mg/dL (ref 8.4–10.5)
Chloride: 98 mEq/L (ref 96–112)
Creatinine, Ser: 0.9 mg/dL (ref 0.4–1.2)
GFR calc Af Amer: >60 mL/min (ref >60)
Sodium: 133 mEq/L — ABNORMAL LOW (ref 135–145)
Total Bilirubin: 0.7 mg/dL (ref 0.3–1.2)

## 2011-03-29 LAB — WET PREP, GENITAL: Yeast Wet Prep HPF POC: NONE SEEN

## 2011-03-29 LAB — GLUCOSE, CAPILLARY: Glucose-Capillary: 324 mg/dL — ABNORMAL HIGH (ref 70–99)

## 2011-03-29 LAB — GC/CHLAMYDIA PROBE AMP, GENITAL: Chlamydia, DNA Probe: NEGATIVE

## 2011-04-05 ENCOUNTER — Emergency Department (HOSPITAL_COMMUNITY)
Admission: EM | Admit: 2011-04-05 | Discharge: 2011-04-05 | Disposition: A | Discharge status: Home / Self Care | Payer: Self-pay | Attending: Emergency Medicine | Admitting: Emergency Medicine

## 2011-04-05 ENCOUNTER — Emergency Department (HOSPITAL_COMMUNITY): Payer: Self-pay

## 2011-04-05 DIAGNOSIS — I10 Essential (primary) hypertension: Secondary | ICD-10-CM | POA: Insufficient documentation

## 2011-04-05 DIAGNOSIS — E1169 Type 2 diabetes mellitus with other specified complication: Secondary | ICD-10-CM | POA: Insufficient documentation

## 2011-04-05 DIAGNOSIS — R5381 Other malaise: Secondary | ICD-10-CM | POA: Insufficient documentation

## 2011-04-05 DIAGNOSIS — R61 Generalized hyperhidrosis: Secondary | ICD-10-CM | POA: Insufficient documentation

## 2011-04-05 DIAGNOSIS — R51 Headache: Secondary | ICD-10-CM | POA: Insufficient documentation

## 2011-04-05 DIAGNOSIS — R079 Chest pain, unspecified: Secondary | ICD-10-CM | POA: Insufficient documentation

## 2011-04-05 DIAGNOSIS — R110 Nausea: Secondary | ICD-10-CM | POA: Insufficient documentation

## 2011-04-05 DIAGNOSIS — E669 Obesity, unspecified: Secondary | ICD-10-CM | POA: Insufficient documentation

## 2011-04-05 LAB — DIFFERENTIAL
Lymphs Abs: 2.8 10*3/uL (ref 0.7–4.0)
Monocytes Relative: 9 % (ref 3–12)
Neutro Abs: 7 10*3/uL (ref 1.7–7.7)
Neutrophils Relative %: 64 % (ref 43–77)

## 2011-04-05 LAB — GLUCOSE, CAPILLARY: Glucose-Capillary: 341 mg/dL — ABNORMAL HIGH (ref 70–99)

## 2011-04-05 LAB — CBC
Hemoglobin: 12.5 g/dL (ref 12.0–15.0)
MCH: 28.2 pg (ref 26.0–34.0)
MCV: 83.3 fL (ref 78.0–100.0)
RBC: 4.44 MIL/uL (ref 3.87–5.11)

## 2011-04-05 LAB — POCT CARDIAC MARKERS
Myoglobin, poc: 35.1 ng/mL (ref 12–200)
Myoglobin, poc: 43.3 ng/mL (ref 12–200)
Troponin i, poc: <0.05 ng/mL (ref 0.00–0.09)

## 2011-04-05 LAB — BASIC METABOLIC PANEL
BUN: 9 mg/dL (ref 6–23)
CO2: 26 mEq/L (ref 19–32)
Chloride: 95 mEq/L — ABNORMAL LOW (ref 96–112)
Creatinine, Ser: 0.8 mg/dL (ref 0.4–1.2)

## 2011-04-15 ENCOUNTER — Emergency Department (HOSPITAL_COMMUNITY)
Admission: EM | Admit: 2011-04-15 | Discharge: 2011-04-15 | Disposition: A | Discharge status: Home / Self Care | Payer: Self-pay | Attending: Emergency Medicine | Admitting: Emergency Medicine

## 2011-04-15 ENCOUNTER — Emergency Department (HOSPITAL_COMMUNITY): Payer: Self-pay

## 2011-04-15 DIAGNOSIS — R1909 Other intra-abdominal and pelvic swelling, mass and lump: Secondary | ICD-10-CM | POA: Insufficient documentation

## 2011-04-15 DIAGNOSIS — Z79899 Other long term (current) drug therapy: Secondary | ICD-10-CM | POA: Insufficient documentation

## 2011-04-15 DIAGNOSIS — M7989 Other specified soft tissue disorders: Secondary | ICD-10-CM | POA: Insufficient documentation

## 2011-04-15 DIAGNOSIS — I10 Essential (primary) hypertension: Secondary | ICD-10-CM | POA: Insufficient documentation

## 2011-04-15 DIAGNOSIS — R609 Edema, unspecified: Secondary | ICD-10-CM | POA: Insufficient documentation

## 2011-04-15 DIAGNOSIS — R0602 Shortness of breath: Secondary | ICD-10-CM | POA: Insufficient documentation

## 2011-04-15 DIAGNOSIS — R51 Headache: Secondary | ICD-10-CM | POA: Insufficient documentation

## 2011-04-15 DIAGNOSIS — R079 Chest pain, unspecified: Secondary | ICD-10-CM | POA: Insufficient documentation

## 2011-04-15 DIAGNOSIS — E119 Type 2 diabetes mellitus without complications: Secondary | ICD-10-CM | POA: Insufficient documentation

## 2011-04-15 DIAGNOSIS — R5381 Other malaise: Secondary | ICD-10-CM | POA: Insufficient documentation

## 2011-04-15 DIAGNOSIS — K117 Disturbances of salivary secretion: Secondary | ICD-10-CM | POA: Insufficient documentation

## 2011-04-15 DIAGNOSIS — R0601 Orthopnea: Secondary | ICD-10-CM | POA: Insufficient documentation

## 2011-04-15 LAB — BASIC METABOLIC PANEL
BUN: 7 mg/dL (ref 6–23)
CO2: 28 mEq/L (ref 19–32)
Chloride: 96 mEq/L (ref 96–112)
Glucose, Bld: 326 mg/dL — ABNORMAL HIGH (ref 70–99)
Potassium: 3.8 mEq/L (ref 3.5–5.1)

## 2011-04-15 LAB — URINE MICROSCOPIC-ADD ON

## 2011-04-15 LAB — POCT CARDIAC MARKERS: CKMB, poc: <1 ng/mL — ABNORMAL LOW (ref 1.0–8.0)

## 2011-04-15 LAB — GLUCOSE, CAPILLARY: Glucose-Capillary: 199 mg/dL — ABNORMAL HIGH (ref 70–99)

## 2011-04-15 LAB — CBC
MCHC: 33.4 g/dL (ref 30.0–36.0)
MCV: 82.9 fL (ref 78.0–100.0)
Platelets: 250 10*3/uL (ref 150–400)
RDW: 12.8 % (ref 11.5–15.5)
WBC: 10.6 10*3/uL — ABNORMAL HIGH (ref 4.0–10.5)

## 2011-04-15 LAB — URINALYSIS, ROUTINE W REFLEX MICROSCOPIC
Bilirubin Urine: NEGATIVE
Glucose, UA: >1000 mg/dL — AB
Hgb urine dipstick: NEGATIVE
Ketones, ur: NEGATIVE mg/dL
pH: 6 (ref 5.0–8.0)

## 2011-04-15 LAB — DIFFERENTIAL
Eosinophils Absolute: 0.2 10*3/uL (ref 0.0–0.7)
Eosinophils Relative: 2 % (ref 0–5)
Lymphs Abs: 2.3 10*3/uL (ref 0.7–4.0)

## 2011-04-15 LAB — PRO B NATRIURETIC PEPTIDE: Pro B Natriuretic peptide (BNP): 64.6 pg/mL (ref 0–125)

## 2011-04-15 LAB — D-DIMER, QUANTITATIVE: D-Dimer, Quant: 0.43 ug/mL-FEU (ref 0.00–0.48)

## 2011-04-27 NOTE — Discharge Summary (Signed)
[name], [name] NO.:  [id number]   MEDICAL RECORD NO.:  [id number]          PATIENT TYPE:  INP   LOCATION:  6713                         FACILITY:  [facility]   PHYSICIAN:  Manning Charity, MD     DATE OF BIRTH:  November 05, 1966   DATE OF ADMISSION:  11/14/2007  DATE OF DISCHARGE:  11/16/2007                               DISCHARGE SUMMARY   DATE OF ADMISSION:  November 14, 2007.   DATE OF DISCHARGE:  November 16, 2007.   DISCHARGE DIAGNOSES:  1. Right-sided facial cellulitis, etiology unclear.  2. Poorly-controlled hypertension.  3. Type 2 diabetes mellitus.  4. Chronic maxillary sinusitis.  5. Gastroesophageal reflux disease.   DISCHARGE MEDICATIONS:  1. Metformin 1000 mg by mouth twice daily for diabetes.  2. Hydrochlorothiazide 25 mg by mouth daily for blood pressure.  3. Clonidine 0.1 mg by mouth twice daily for blood pressure.  4. Hydrocodone with acetaminophen 5/325 mg:  One to two tablets every      6 hours as needed for pain.  5. Benadryl 25 mg by mouth every 6 hours as needed for itching or      swelling.   Note:  The patient was instructed to take Benadryl for itching and  swelling and apply ice packs and cold towels frequently to help with any  swelling.  She was instructed to take Vicodin only for pain and to not  drive while taking Vicodin or Benadryl.  She was instructed to return to  the [facility] Emergency Department immediately if she had any trouble  breathing or facial swelling affected her vision.  She was also  cautioned not to take extra Tylenol while taking Vicodin.   CONSULTATIONS:  None.   PROCEDURES:  1. A computed tomography scan of the maxillofacial region on November 14, 2007 revealed no facial abscess.  No fractures or periosteal      reaction were noted.  Metallic dental artifacts were present.      There was subcutaneous stranding in the midline, infranasal and      right face medially.  These were thought to be likely  due to      cellulitis.  No fluid collection was noted.  The paranasal sinuses      and cervical airways were patent.  Also noted were mild to      borderline enlarged lymph nodes in the submandibular region, likely      reactive in nature.  2. An orthopantogram on November 14, 2007 revealed no acute bony      abnormality.  There were multiple dental caries in the remaining      mandibular and maxillary teeth without periapical abscess.   ADMISSION HISTORY:  [name] is a 45 year old African American woman  with a past medical history significant for type 2 diabetes mellitus,  hypertension, and recurrent facial cellulitis who presented to the [facility] Emergency Department on November 14, 2007, complaining of right-  sided facial and lip swelling which started 2 days prior to admission.  She reported that the swelling involved the  right side of her cheek  medially and continued to the upper and lower lip on the same side.  Of  note, the patient was in the emergency department on November 30th and  was prescribed both penicillin and Vicodin for recurrent dental pain.  She took these medications for approximately 24 hours before presenting  to the emergency department again for this admission.  She endorsed  nausea with 2 episodes of vomiting the day prior to admission with food  particles but no blood.  She endorsed subjective fevers, chills and  diarrhea since starting penicillin, though she had only taken 1 dose.  She did report undergoing dental work with her last tooth extraction in  May of 2008.  She had not had any dental work done since that time.  She  works as a Teacher, English as a foreign language but currently was not working with any acute  patient or employees with any acute infectious process.  She also  endorsed mild chest pain the day prior to admission but was unable to  describe it further secondary to drowsiness from narcotics given in the  emergency department.  She has no history of  abdominal pain or  productive cough.  In the emergency department, she was given  intravenous fluids, labetalol, Dilaudid, and Zofran.   ADMISSION PHYSICAL EXAM:  VITAL SIGNS:  Temperature 97.3 degrees  Fahrenheit, blood pressure 231/138, pulse 98, respiration rate 18,  oxygen saturation 99% on room air.  GENERAL:  Drowsy but arousable.  No acute distress.  HEENT:  Pupils small and minimally reactive, extraocular movements  intact, oropharynx clear.  Poor dentition with multiple missing teeth.  No gingival erythema or palpable abscess.  NECK:  Supple with shotty lymph nodes in the right submandibular region.  No thyromegaly.  RESPIRATORY:  Decreased air entry bilaterally secondary to body habitus  but clear to auscultation bilaterally.  CARDIOVASCULAR:  Regular rate and rhythm without murmurs, rubs or  gallops.  GASTROINTESTINAL:  Soft, obese, slightly tender in the epigastric region  on deep palpation.  Positive bowel sounds.  EXTREMITIES:  No clubbing, cyanosis, edema.  GENITOURINARY:  Deferred.  SKIN:  Right facial swelling with very minimal overlying erythema  starting 4 to 5 cm from the angle of the mandible, extending to the  upper and lower lips.  NEUROLOGIC:  Drowsy but arousable, oriented x3, grossly nonfocal.   ADMISSION LABS:  Laboratory studies on the day of admission revealed:  Sodium 133, potassium 4.2, chloride 94, bicarbonate 30, BUN 4,  creatinine 0.83, glucose 264.  Bilirubin 0.9, alkaline phosphatase 84,  AST 21, ALT 28, total protein 7.6, albumin 4.1, calcium 9.  White blood  cell count 11.7 with an ANC of 6.9, hemoglobin 12.7, hematocrit 38.3,  platelet count 280,000, MCV 84.2, RDW 13.1.  Urinalysis revealed  specific gravity of 1.007, pH 7, 100 glucose, small leukocytes.  Urine  microscopy was unremarkable.  Coagulation studies were normal.  Cardiac  enzymes were negative.  Hemoglobin A1c was 8.8%.  Urine drug screen was  positive for opioids.  Urine culture  revealed no growth.   HOSPITAL COURSE:  1. Recurrent right facial swelling.  The patient has a history of      intermittent right-sided facial edema, thought to be related to      cellulitis in the past with recurrent maxillary sinus infections.      The patient did not have a history of MRSA cellulitis and her MRSA      nasal swab was negative.  Ultimately, the differential diagnosis      was felt to be overlying cellulitis with a site of entry related to      her recurrent dental disease vs. maxillary sinusitis with overlying      skin changes.  Angioedema was also considered given the patient's      recent antibiotic use.  Familial causes of angioedema were also      considered, so a complement workup was instituted.  Of note, this      revealed an antinuclear antibody that was negative, a C4 complement      level that was normal at 35, a C1 esterase inhibitor within the      normal range at 14, and a functional C1 esterase inhibitor assay      that was normal at 72%.  Given the patient's recurrent symptoms,      this was felt to be related to a possible extension of sinusitis to      the skin, but CT scan did not reveal any acute sinusitis; thus, we      are left without a firm diagnosis, but feel that this is likely      related to recurrent sinusitis and possibly an associated allergic      reaction.  Thus, the patient was treated supportively with Vicodin      for pain and Benadryl for itching and swelling.  She did not have      any warning signs either prior to or during admission such as      difficulty breathing or stridor, so this was not considered to be      an urgent issue of angioedema.  We did discuss referring the      patient for outpatient allergy testing, but her lack of health      insurance made this impossible.  Given the negative results of her      workup, we do not have a firm diagnosis for the reason for the      patient's recurrent facial swelling, so as of  now it is still      idiopathic facial swelling, likely cellulitis vs. angioedema.  2. Type 2 diabetes mellitus.  The patient had recently stopped taking      metformin at home secondary to financial issues.  We restarted this      medication at discharge for her to follow up at [facility].  Her      diabetes is poorly controlled.  3. Hypertension.  The patient was restarted on a regimen to include      hydrochlorothiazide and clonidine during this admission.  Of note,      ACE inhibitors and ARBs were avoided given her symptoms of possible      angioedema of unknown etiology.  4. Leukocytosis.  Nasal screen for MRSA was negative.  Urine culture      was negative.  There was no evidence to suggest bacteremia, so      blood cultures were not obtained.  Recommend followup as an      outpatient to ensure resolution of this leukocytosis, which is      likely due to acute demargination.  5. Disposition.  The patient was discharged home with instructions to      apply ice packs intermittently to the right face, with Benadryl for      swelling and Vicodin for pain.  She was told to report back to the      emergency  department immediately should any increase in severity of      pain, problems with vision, or any difficulty breathing, as this      could suggest a life-threatening reaction.  None of these warning      signs were present during the patient's admission to the hospital.   DISCHARGE LABS:  Laboratory studies on the day of discharge revealed:  Sodium 137, potassium 3.8, chloride 98, bicarbonate 32, BUN 8,  creatinine 1.08, glucose 142, calcium 8.9.  White blood cell count 10.8,  hemoglobin 11.2, platelet count 243,000.  Hemoglobin A1c, as mentioned  previously, was 8.8%.   DISCHARGE VITALS:  VITAL SIGNS ON THE DAY OF DISCHARGE WERE:  Temperature 98.5 degrees Fahrenheit, blood pressure 139/82, pulse 78,  respiration rate 18, oxygen saturation 92% on room air.  CBGs prior to   admission were 181, 150, 309, and 214.   DISPOSITION AND FOLLOWUP:  The patient will follow up with Dr.  Audria Nine, at [facility], on December 30th at 10 a.m.  She was  instructed to call in advance if she needs to reschedule this  appointment.  At that time, I do recommend followup with a CBC to  monitor the patient's white blood cell count as well as discussion of  her facial swelling.  She may need an additional agent to control her  blood sugars as well, and I would consider starting Lantus if it is  affordable.  As mentioned previously, workup for markers of hereditary  angioedema were negative, so the next step would likely be referral to  an allergist if the patient's symptoms recur.      Madelaine Etienne, MD  Electronically Signed      Manning Charity, MD  Electronically Signed    JH/MEDQ  D:  02/20/2008  T:  02/20/2008  Job:  [id number]

## 2011-04-30 NOTE — Discharge Summary (Signed)
[name], [name]              ACCOUNT NO.:  [id number]   MEDICAL RECORD NO.:  [id number]          PATIENT TYPE:  INP   LOCATION:  5703                         FACILITY:  [facility]   PHYSICIAN:  Fransisco Hertz, M.D.  DATE OF BIRTH:  03-04-1966   DATE OF ADMISSION:  02/13/2006  DATE OF DISCHARGE:  02/16/2006                                 DISCHARGE SUMMARY   DISCHARGE DIAGNOSES:  1.  Facial and periorbital cellulitis secondary to acute on chronic right      maxillary sinusitis.  2.  Hypertension.  3.  Diabetes mellitus.  4.  Diarrhea.   DISCHARGE MEDICATIONS:  1.  HCTZ 25 mg p.o. daily.  2.  Clonidine 0.1 mg p.o. b.i.d.  3.  Verapamil SR 240 mg p.o. daily.  4.  Metformin 500 mg p.o. b.i.d.  5.  Augmentin 875 mg p.o. b.i.d. for 11 days.  6.  Imodium 2 mg after each loose stool.  7.  Flonase 2 sprays to the nostril daily for maximum of 2 weeks.  8.  Ibuprofen 400 mg 1 to 2 tabs q.6 hours p.r.n.   DISPOSITION:  The patient was discharged to home with 14 days total of  antibiotics. She will be taking Augmentin 875 mg twice a day. She was  advised to seek medical help if her fevers or swelling were to recur. At the  time of discharge a nasal swab done for MRSA was still negative to date, but  final report was pending. If this was found to be positive her antibiotics  should be changed to cover MRSA. As for her other health issues consisting  of hypertension and diabetes mellitus, the patient has an appointment to  follow-up with Dr. Albin Jacobs on March 27 at 9:30 a.m.   PROCEDURE:  1.  A CT of the neck with contrast was performed on March 4 which showed      cellulitis over the right orbit without evidence for post septal      extension. There were also changes in the left maxillary sinus      suggesting an acute on chronic process.  2.  An orthopantogram was also performed on March 5 which showed no evidence      of bone destruction.   HISTORY AND PHYSICAL:  [name] is a  45 year old woman who presented to  the emergency room with a 1 month history of right-sided facial pain and  edema. This all started as tooth pain with edema that had been treated with  amoxicillin for a few days and then changed for Clindamycin which she had  for 10 days. The pain and swelling had improved at first, but then on  Friday, March 2 the facial edema and pain recurred over the right maxillary  sinus and very quickly spread to her right neck as well as around her eye.  She had experienced some subjective fevers with 1 episode of nausea and  vomiting prior to admission. She denied any pain with eye movement, but did  report occasional transient diplopia. Temperature 98.4, blood pressure  169/97, pulse 101, respiratory rate 20,  saturation of oxygen 98% on room  air. The patient is morbidly obese, was in no acute distress. She had very  severe swelling around her right eye keeping her eye almost completely shut.  Her eye movements were intact. She had no pain on moving her eyes. The  swelling extended alongside her face as well as her neck and the right side  of her lips. She has fair dentition. No obvious ulcerations in her mouth. No  tooth pain and no pharyngeal exudate or redness. Her lungs were clear to  auscultation bilaterally with good air movement. Her heart and abdominal  examination were normal. She had 1+ pitting edema on her extremities. She  had no palpable cervical or supraclavicular adenopathies.   LABORATORY DATA:  She had a white count of 13.8, a hemoglobin of 13.1,  platelets 276, sodium 134, potassium 3.6, chloride 104, CO2 25, BUN 3,  creatinine 1.0, glucose 101. A CT of the head showed that since the previous  scan done on February 14, she had progression of edema and inflammation over  the right orbit without septal extension as well as acute on chronic right  maxillary sinusitis.   HOSPITAL COURSE:  The patient was admitted and started on Vancomycin as  well  as Zosyn for treatment of severe facial and periorbital cellulitis. A nasal  swab was done to rule out MRSA colonization. The results are still pending  on the day of discharge. The pain and swelling improved very quickly with  the antibiotic. At first we tried to manage her pain with Vicodin which  actually made the pain worse. We then DC'd the Vicodin and ordered ibuprofen  800 mg p.r.n. which relieved her pain very well.   Note that the patient's blood pressure during the hospitalization was very  elevated at times going as high as 178/89. Note that the patient did not  have her list of medications with her so we had originally started her on  hydrochlorothiazide 25 mg daily as well as Lopressor 50 mg b.i.d. On the day  of discharge we finally received her list of medications from [facility]  pharmacy which included HCTZ 25 mg, but also verapamil SR 240 mg daily as  well as Clonidine 0.1 mg at bedtime. We will continue these as previously on  discharge except increase her Clonidine from 0.1 mg daily to 0.1 mg b.i.d.  She will follow-up with Dr. Albin Jacobs on her regular appointment of March  27 with this problem.   Also of note the patient mentioned that she had a history of diabetes in the  past for which she was on Glucophage. Her blood sugars were consistently  elevated in the hospital going as high as 270. Her blood sugar was over 220  on 3 occasions in the hospital. We also checked a hemoglobin A1C which was  7.7 on the day of admission. We have started her back on metformin with a  minimal dose of 500 mg p.o. b.i.d. to start with and she will follow-up with  Dr. Albin Jacobs for this. She has been advised to try to lose weight as this  would help tremendously with her diabetes.   The patient also had diarrhea while in the hospital. We checked for  Clostridium difficile which was negative twice. We have advised her to take Imodium for symptom relief as this might be either  viral gastroenteritis or  diarrhea secondary to her antibiotics.      Dellia Beckwith, M.D.  ______________________________  Fransisco Hertz, M.D.    VD/MEDQ  D:  02/16/2006  T:  02/17/2006  Job:  [id number]   cc:   FAX [phone] [facility] attention Dr. Albin Jacobs

## 2011-04-30 NOTE — Group Therapy Note (Signed)
NAME:  [name], [name] NO.:  [id number]   MEDICAL RECORD NO.:  [id number]          PATIENT TYPE:  WOC   LOCATION:  [facility]                   FACILITY:  [facility]   PHYSICIAN:  Argentina Donovan, MD        DATE OF BIRTH:  January 07, 1966   DATE OF SERVICE:  05/27/2005                                    CLINIC NOTE   REASON FOR VISIT:  The patient is a 45 year old, 314-pound, 5 feet 9 inch  female gravida 2 para 2-0-0-2 who has always had normal periods up until her  last cycle where she began heavy period that lasted 3 weeks and with passage  of clots. She has had recent significant weight gain and went to the [facility]  where she was placed on oral contraceptives that controlled the bleeding, so  at this moment there is no reason to do anything else. She had an ultrasound  which showed no sign of fibroids, small functional cyst on one of the  ovaries. So the plan is to let her continue on the oral contraceptives. She  is going to stop after her first cycle, and then see whether or not she is  back to a normal cycle.   IMPRESSION:  Dysfunctional uterine bleeding. A Pap smear was taken today at  the same time.       PR/MEDQ  D:  05/27/2005  T:  05/28/2005  Job:  [id number]

## 2011-05-14 HISTORY — PX: CARDIAC CATHETERIZATION: SHX172

## 2011-06-10 ENCOUNTER — Emergency Department (HOSPITAL_COMMUNITY): Payer: Self-pay

## 2011-06-10 ENCOUNTER — Inpatient Hospital Stay (HOSPITAL_COMMUNITY)
Admission: EM | Admit: 2011-06-10 | Discharge: 2011-06-11 | DRG: 287 | Disposition: A | Discharge status: Home / Self Care | Payer: Self-pay | Attending: Internal Medicine | Admitting: Internal Medicine

## 2011-06-10 DIAGNOSIS — K219 Gastro-esophageal reflux disease without esophagitis: Secondary | ICD-10-CM | POA: Diagnosis present

## 2011-06-10 DIAGNOSIS — I10 Essential (primary) hypertension: Secondary | ICD-10-CM | POA: Diagnosis present

## 2011-06-10 DIAGNOSIS — IMO0001 Reserved for inherently not codable concepts without codable children: Secondary | ICD-10-CM | POA: Diagnosis present

## 2011-06-10 DIAGNOSIS — Z7982 Long term (current) use of aspirin: Secondary | ICD-10-CM

## 2011-06-10 DIAGNOSIS — R079 Chest pain, unspecified: Principal | ICD-10-CM | POA: Diagnosis present

## 2011-06-10 DIAGNOSIS — J329 Chronic sinusitis, unspecified: Secondary | ICD-10-CM | POA: Diagnosis present

## 2011-06-10 DIAGNOSIS — I447 Left bundle-branch block, unspecified: Secondary | ICD-10-CM | POA: Diagnosis present

## 2011-06-10 DIAGNOSIS — Z8249 Family history of ischemic heart disease and other diseases of the circulatory system: Secondary | ICD-10-CM

## 2011-06-10 DIAGNOSIS — E785 Hyperlipidemia, unspecified: Secondary | ICD-10-CM | POA: Diagnosis present

## 2011-06-10 DIAGNOSIS — Z79899 Other long term (current) drug therapy: Secondary | ICD-10-CM

## 2011-06-10 LAB — CK TOTAL AND CKMB (NOT AT ARMC)
CK, MB: 2.4 ng/mL (ref 0.3–4.0)
CK, MB: 2 ng/mL (ref 0.3–4.0)
Relative Index: 1.8 (ref 0.0–2.5)
Total CK: 121 U/L (ref 7–177)

## 2011-06-10 LAB — LIPID PANEL
Cholesterol: 221 mg/dL — ABNORMAL HIGH (ref 0–200)
HDL: 46 mg/dL (ref >39)
Triglycerides: 341 mg/dL — ABNORMAL HIGH (ref <150)

## 2011-06-10 LAB — CBC
MCH: 27.7 pg (ref 26.0–34.0)
MCHC: 33.7 g/dL (ref 30.0–36.0)
RDW: 12.4 % (ref 11.5–15.5)

## 2011-06-10 LAB — BASIC METABOLIC PANEL
BUN: 7 mg/dL (ref 6–23)
CO2: 25 mEq/L (ref 19–32)
Calcium: 9.1 mg/dL (ref 8.4–10.5)
GFR calc non Af Amer: >60 mL/min (ref >60)
Glucose, Bld: 263 mg/dL — ABNORMAL HIGH (ref 70–99)
Sodium: 133 mEq/L — ABNORMAL LOW (ref 135–145)

## 2011-06-10 LAB — APTT: aPTT: 33 seconds (ref 24–37)

## 2011-06-10 LAB — HEMOGLOBIN A1C
Hgb A1c MFr Bld: 11.1 % — ABNORMAL HIGH (ref <5.7)
Mean Plasma Glucose: 272 mg/dL — ABNORMAL HIGH (ref <117)

## 2011-06-10 LAB — DIFFERENTIAL
Basophils Absolute: 0 10*3/uL (ref 0.0–0.1)
Basophils Relative: 0 % (ref 0–1)
Eosinophils Absolute: 0.1 10*3/uL (ref 0.0–0.7)
Eosinophils Relative: 1 % (ref 0–5)
Monocytes Absolute: 0.8 10*3/uL (ref 0.1–1.0)
Monocytes Relative: 8 % (ref 3–12)

## 2011-06-10 LAB — HEPATIC FUNCTION PANEL
AST: 17 U/L (ref 0–37)
Albumin: 3.9 g/dL (ref 3.5–5.2)
Bilirubin, Direct: 0.1 mg/dL (ref 0.0–0.3)
Total Bilirubin: 0.6 mg/dL (ref 0.3–1.2)

## 2011-06-10 LAB — PROTIME-INR
INR: 0.93 (ref 0.00–1.49)
Prothrombin Time: 12.7 seconds (ref 11.6–15.2)

## 2011-06-11 LAB — BASIC METABOLIC PANEL
CO2: 30 mEq/L (ref 19–32)
Chloride: 99 mEq/L (ref 96–112)
Creatinine, Ser: 0.83 mg/dL (ref 0.50–1.10)
GFR calc Af Amer: >60 mL/min (ref >60)
Potassium: 4 mEq/L (ref 3.5–5.1)
Sodium: 137 mEq/L (ref 135–145)

## 2011-06-11 LAB — GLUCOSE, CAPILLARY: Glucose-Capillary: 229 mg/dL — ABNORMAL HIGH (ref 70–99)

## 2011-06-11 LAB — CARDIAC PANEL(CRET KIN+CKTOT+MB+TROPI)
CK, MB: 1.9 ng/mL (ref 0.3–4.0)
Total CK: 79 U/L (ref 7–177)
Troponin I: <0.3 ng/mL (ref <0.30)

## 2011-06-11 LAB — CBC
MCV: 84.8 fL (ref 78.0–100.0)
Platelets: 229 10*3/uL (ref 150–400)
RBC: 4.09 MIL/uL (ref 3.87–5.11)
RDW: 12.7 % (ref 11.5–15.5)
WBC: 8.4 10*3/uL (ref 4.0–10.5)

## 2011-06-17 NOTE — Cardiovascular Report (Signed)
  [name], [name] NO.:  [id number]  MEDICAL RECORD NO.:  [id number]  LOCATION:  6522                         FACILITY:  [facility]  PHYSICIAN:  [name], MDDATE OF BIRTH:  1966/11/16  DATE OF PROCEDURE:  06/10/2011 DATE OF DISCHARGE:                           CARDIAC CATHETERIZATION   IDENTIFICATION:  [name] is a 45 year old woman with a history of morbid obesity, diabetes, hypertension, and hyperlipidemia.  She has had several admissions for chest pain, had several previous Myoview, most recently in 2011.  She was admitted again with chest pain and new left bundle-branch block.  Cardiac enzymes have been negative.  Given her risk factors and her new left bundle in the setting of chest pain, we decided to bring her to cardiac catheterization for further evaluation.  PROCEDURES PERFORMED: 1. Selective coronary angiography. 2. Left heart cath. 3. Left ventriculogram.  DESCRIPTION OF PROCEDURE:  The risks and indication were explained. Consent was signed and placed on the chart.  After a confirmation of a normal Allen's test, the right wrist area was prepped and draped in routine sterile fashion, anesthetized with 1% local lidocaine.  A 5- French arterial sheath was placed in the right radial artery using modified Seldinger technique.  Once the sheath was then, we gave 3 mg of intra-arterial verapamil and 6000 units of systemic heparin.  Standard catheters including JR-4, JL-3.5, and angled pigtail were used.  All catheter exchanges made over wire.  There are no apparent complications. Center aortic pressure 164/110 with a mean of 134.  LV pressure 175/90 with an EDP of 24.  There are no apparent complications.  Left main was normal.  LAD was a long vessel wrapping apex, gave off large diagonal branch, it was angiographically normal.  Left circumflex gave off a small ramus branch, a large OM-1, small OM-2, and a small OM-3.  It is  angiographically normal.  Right coronary artery was a large dominant vessel, gave off an RV branch, a large PDA, and 2 posterolaterals is angiographically normal.  Left ventriculogram done in the RAO position showed an EF of 50% to 55%. There were no regional wall motion abnormalities.  ASSESSMENT: 1. Normal coronary arteries. 2. Normal left ventricular function. 3. Severe hypertension.  PLAN:  At discussion, we will check a D-dimer.  This is negative.  She can be discharged home today with aggressive management of her risk factors including blood pressure control.     Caitlyn Buckles. Bensimhon, MD     DRB/MEDQ  D:  06/10/2011  T:  06/10/2011  Job:  [id number]  Electronically Signed by Arvilla Meres MD on 06/17/2011 03:30:48 PM

## 2011-06-17 NOTE — Discharge Summary (Addendum)
[name], [name] NO.:  [id number]  MEDICAL RECORD NO.:  [id number]  LOCATION:  6533                         FACILITY:  [facility]  PHYSICIAN:  Marca Ancona, MD      DATE OF BIRTH:  1966/10/02  DATE OF ADMISSION:  06/10/2011 DATE OF DISCHARGE:  06/11/2011                              DISCHARGE SUMMARY   DISCHARGE DIAGNOSES: 1. Chest pain.     a.     Negative D-dimer.     b.     Negative cardiac enzymes.     c.     Normal coronary arteries by cath, June 10, 2011, with normal      left ventricular function. 2. Hypertension. 3. Uncontrolled diabetes mellitus with hemoglobin A1c of 11.1. 4. History of Lexiscan Myoview in 2011 showing no scar or ischemia. 5. Hyperlipidemia. 6. Obesity. 7. Family history of coronary artery disease. 8. Gastroesophageal reflux disease. 9. Sinusitis. 10.Status post breast biopsy. 11.Left bundle-branch block, diagnosed this admission.  HOSPITAL COURSE:  [name] is a 45 year old female with a past medical history of diabetes, hypertension, hyperlipidemia, but no history of coronary artery disease.  She presented with an episode of substernal chest pain 1 day prior to admission without activity.  It was sharp and went through to her back and had some shortness of breath and diaphoresis with that.  The chest pain resolved on day of admission, so she presented to [facility] for further evaluation.  Initial EKG shows sinus tach with a rate of 115 with left bundle-branch block which is different from an EKG dated May 2012.  However, her symptoms were rather atypical and troponins were negative x2, so this was not felt to be an ST-elevation MI.  However, given her symptoms and risk factors, she was taken to the cath lab to define her coronary anatomy which demonstrated normal coronary arteries, normal LV function with an EF of 50-55%, and severe hypertension.  D-dimer was negative.  The patient was scheduled to go home, but  in the evening developed nausea and some sensation of indigestion.  GI cocktail was given with relief, and the pain was observed overnight.  She did well.  Dr. Shirlee Latch has seen and examined her today and felt she is stable for discharge.  DISCHARGE LABORATORY DATA:  WBC 8.4, hemoglobin 11.3, hematocrit 34.7, platelet count 229.  Sodium 137, potassium 4, chloride 99, CO2 of 30, glucose 243, BUN 9, creatinine 0.83.  LFTs were normal.  A1c 11.1. Cardiac enzymes negative x3.  Total cholesterol 221, triglycerides 341, HDL 46, LDL 107.  D-dimer is 0.38.  STUDIES: 1. Chest x-ray, June 10, 2011, showed no evidence of active pulmonary     disease. 2. Cardiac catheterization, June 10, 2011, please see full report for     details as well as HPI for summary.  DISCHARGE MEDICATIONS:  Please note that these medications were verified with the patient and she was taking two beta-blockers.  She states her primary care has put her on both because she has such severe hypertension.  She has been instructed to verify with her PCP as to whether or not these should be continued as they are not  traditionally used together. 1. Amlodipine 10 mg daily. 2. Avapro 300 mg daily. 3. Aspirin 325 mg daily. 4. Coreg 3.125 mg b.i.d. 5. Citalopram 20 mg every morning. 6. Doxazosin 4 mg nightly. 7. Glipizide 10 mg b.i.d. with meals. 8. Metformin 1000 mg b.i.d. with instructions not to start until June 13, 2011. 9. Metoprolol tartrate 50 mg b.i.d. 10.Protonix 40 mg 30 minutes before breakfast. 11.Triamterene and hydrochlorothiazide 75/50 mg daily. 12.Ventolin inhaler 2 puffs inhaled q.4 h. p.r.n. for asthma symptoms. 13.Verapamil SR 240 mg every morning.  DISPOSITION:  [name] will be discharged in stable condition to home. She is to follow a low-sodium heart-healthy diet and to call or return for pain, swelling, bleeding, or pus at her cath site.  She only needs to follow up with our office as needed.  I  also personally discussed the importance of her following up with her primary care provider for continued management of risk factors, including control of her diabetes and hypertension.  DURATION OF DISCHARGE ENCOUNTER:  Greater than 30 minutes including physician and PA time.     Ronie Spies, P.A.C.   ______________________________ Marca Ancona, MD    DD/MEDQ  D:  06/11/2011  T:  06/11/2011  Job:  [id number]  cc:   Dr. Andrey Campanile at [facility]  Electronically Signed by Ronie Spies  on 06/17/2011 01:45:14 PM Electronically Signed by Marca Ancona MD on 07/01/2011 01:28:45 PM

## 2011-07-14 ENCOUNTER — Emergency Department (HOSPITAL_COMMUNITY): Payer: Medicaid Other

## 2011-07-14 ENCOUNTER — Inpatient Hospital Stay (HOSPITAL_COMMUNITY)
Admission: EM | Admit: 2011-07-14 | Discharge: 2011-07-18 | DRG: 292 | Disposition: A | Discharge status: Home / Self Care | Payer: Medicaid Other | Attending: Internal Medicine | Admitting: Internal Medicine

## 2011-07-14 DIAGNOSIS — E876 Hypokalemia: Secondary | ICD-10-CM | POA: Diagnosis present

## 2011-07-14 DIAGNOSIS — Z6841 Body Mass Index (BMI) 40.0 and over, adult: Secondary | ICD-10-CM

## 2011-07-14 DIAGNOSIS — Z91199 Patient's noncompliance with other medical treatment and regimen due to unspecified reason: Secondary | ICD-10-CM

## 2011-07-14 DIAGNOSIS — K5900 Constipation, unspecified: Secondary | ICD-10-CM | POA: Diagnosis present

## 2011-07-14 DIAGNOSIS — I509 Heart failure, unspecified: Secondary | ICD-10-CM | POA: Diagnosis present

## 2011-07-14 DIAGNOSIS — M549 Dorsalgia, unspecified: Secondary | ICD-10-CM | POA: Diagnosis present

## 2011-07-14 DIAGNOSIS — Z9119 Patient's noncompliance with other medical treatment and regimen: Secondary | ICD-10-CM

## 2011-07-14 DIAGNOSIS — I10 Essential (primary) hypertension: Secondary | ICD-10-CM | POA: Diagnosis present

## 2011-07-14 DIAGNOSIS — Z794 Long term (current) use of insulin: Secondary | ICD-10-CM

## 2011-07-14 DIAGNOSIS — IMO0001 Reserved for inherently not codable concepts without codable children: Secondary | ICD-10-CM | POA: Diagnosis present

## 2011-07-14 DIAGNOSIS — J45909 Unspecified asthma, uncomplicated: Secondary | ICD-10-CM | POA: Diagnosis present

## 2011-07-14 DIAGNOSIS — I5033 Acute on chronic diastolic (congestive) heart failure: Principal | ICD-10-CM | POA: Diagnosis present

## 2011-07-14 LAB — DIFFERENTIAL
Eosinophils Absolute: 0.1 10*3/uL (ref 0.0–0.7)
Lymphs Abs: 5.3 10*3/uL — ABNORMAL HIGH (ref 0.7–4.0)
Monocytes Relative: 8 % (ref 3–12)
Neutro Abs: 7.1 10*3/uL (ref 1.7–7.7)
Neutrophils Relative %: 52 % (ref 43–77)

## 2011-07-14 LAB — PRO B NATRIURETIC PEPTIDE: Pro B Natriuretic peptide (BNP): 525.3 pg/mL — ABNORMAL HIGH (ref 0–125)

## 2011-07-14 LAB — CBC
Hemoglobin: 12.7 g/dL (ref 12.0–15.0)
MCH: 27.5 pg (ref 26.0–34.0)
MCV: 83.1 fL (ref 78.0–100.0)
Platelets: 253 10*3/uL (ref 150–400)
RBC: 4.61 MIL/uL (ref 3.87–5.11)
WBC: 13.7 10*3/uL — ABNORMAL HIGH (ref 4.0–10.5)

## 2011-07-14 LAB — POCT I-STAT, CHEM 8
BUN: <3 mg/dL — ABNORMAL LOW (ref 6–23)
Chloride: 98 mEq/L (ref 96–112)
HCT: 42 % (ref 36.0–46.0)
Potassium: 3.2 mEq/L — ABNORMAL LOW (ref 3.5–5.1)
Sodium: 137 mEq/L (ref 135–145)

## 2011-07-15 ENCOUNTER — Other Ambulatory Visit (HOSPITAL_COMMUNITY): Payer: Self-pay

## 2011-07-15 DIAGNOSIS — I10 Essential (primary) hypertension: Secondary | ICD-10-CM

## 2011-07-15 LAB — CARDIAC PANEL(CRET KIN+CKTOT+MB+TROPI)
CK, MB: 2.6 ng/mL (ref 0.3–4.0)
CK, MB: 2.3 ng/mL (ref 0.3–4.0)
Relative Index: INVALID (ref 0.0–2.5)
Total CK: 102 U/L (ref 7–177)
Troponin I: <0.3 ng/mL (ref <0.30)

## 2011-07-15 LAB — CK TOTAL AND CKMB (NOT AT ARMC)
CK, MB: 2.7 ng/mL (ref 0.3–4.0)
Relative Index: 2.3 (ref 0.0–2.5)
Total CK: 116 U/L (ref 7–177)

## 2011-07-15 LAB — GLUCOSE, CAPILLARY
Glucose-Capillary: 206 mg/dL — ABNORMAL HIGH (ref 70–99)
Glucose-Capillary: 239 mg/dL — ABNORMAL HIGH (ref 70–99)

## 2011-07-16 ENCOUNTER — Inpatient Hospital Stay (HOSPITAL_COMMUNITY): Payer: Medicaid Other

## 2011-07-16 LAB — BASIC METABOLIC PANEL
BUN: 9 mg/dL (ref 6–23)
CO2: 29 mEq/L (ref 19–32)
Chloride: 96 mEq/L (ref 96–112)
GFR calc Af Amer: >60 mL/min (ref >60)
Glucose, Bld: 149 mg/dL — ABNORMAL HIGH (ref 70–99)
Potassium: 3.9 mEq/L (ref 3.5–5.1)

## 2011-07-16 LAB — CBC
HCT: 36.7 % (ref 36.0–46.0)
Hemoglobin: 12 g/dL (ref 12.0–15.0)
RBC: 4.39 MIL/uL (ref 3.87–5.11)
RDW: 13.1 % (ref 11.5–15.5)
WBC: 9.3 10*3/uL (ref 4.0–10.5)

## 2011-07-16 LAB — GLUCOSE, CAPILLARY
Glucose-Capillary: 278 mg/dL — ABNORMAL HIGH (ref 70–99)
Glucose-Capillary: 224 mg/dL — ABNORMAL HIGH (ref 70–99)
Glucose-Capillary: 178 mg/dL — ABNORMAL HIGH (ref 70–99)

## 2011-07-16 LAB — PHOSPHORUS: Phosphorus: 3.7 mg/dL (ref 2.3–4.6)

## 2011-07-16 NOTE — H&P (Signed)
[name], CLONCH NO.:  [id number]  MEDICAL RECORD NO.:  [id number]  LOCATION:  [facility]                         FACILITY:  [facility]  PHYSICIAN:  Gery Pray, MD      DATE OF BIRTH:  May 10, 1966  DATE OF ADMISSION:  07/14/2011 DATE OF DISCHARGE:                             HISTORY & PHYSICAL   PRIMARY CARE PHYSICIAN:  Georganna Skeans, [facility].  CARDIOLOGIST:  She identifies Dr. Marca Ancona.  CODE STATUS:  Full code.  CHIEF COMPLAINT:  Shortness of breath.  HISTORY OF PRESENT ILLNESS:  This is a pleasant 45 year old female who states that she has been having chest tightness since last Wednesday. Today, she was picking up bottled water to take for her mother when her chest pain became uncontrolled, she developed shortness of breath and wheezing.  She thought this was related to her asthma attack.  However, she did not have any pills or inhaler.  She states she could hardly make it meaning she could hardly walk to the bathroom.  Her chest pain was left-sided.  She states she had no radiation.  She came the ER where the patient was found to have uncontrolled hypertension with the blood pressures of 210/110.  She also had pulmonary edema.  The patient states that she has been gaining weight.  She did see her doctor a few days ago and at that point, she had gained 4 pounds in the few days.  She has swelling on the left lower extremity, a little bit on the right.  She states that when she saw her doctor, blood pressure is high and her metoprolol was doubled dose to 100 mg p.o. b.i.d.  The patient states that she does have migraines, chronic headache.  She had some nausea, but no vomiting.  On talking further with the patient, she has been hypertensive since she was 16.  It has always been uncontrolled.  She states that this affected her vision several years ago; however, the patient does not seem to be very knowledgeable on her  hypertensive disease.  She does not monitor blood pressure at home.  She does not have a home blood pressure cuff.  She states that when her doctor increase the metoprolol from 50 to 100 mg p.o. b.i.d., her blood pressure was 116, which is likely incorrect.  History obtained from the patient who is alert and oriented.  REVIEW OF SYSTEMS:  All 10-point systems reviewed and negative except as noted in the HPI.  PAST MEDICAL HISTORY: 1. Diabetes mellitus. 2. Hypertension. 3. Asthma. 4. Chronic back pain.  PAST SURGICAL HISTORY:  Three lumpectomies.  MEDICATIONS:  The patient is unclear of her medications is include, 1. Insulin twice daily 15 units. 2. Metoprolol 100 mg twice daily. 3. Metformin 1000 mg b.i.d. 4. She is also on Glucotrol. 5. Actos. 6. She states she believes she is on 4 other blood pressure     medications.  The patient's blood pressure meds have been reconciled; however, I am unable to find the sheet, and she does not know the names of her other meds.  ALLERGIES:  The patient states she is allergic to CLINDAMYCIN AND ENALAPRIL.  SOCIAL HISTORY:  Negative for tobacco, alcohol or illicit drug.  FAMILY HISTORY:  Significant for hypertension, CHF, renal failure, diabetes mellitus, and breast cancer in the aunt's and at grandmother.  PHYSICAL EXAMINATION:  VITAL SIGNS:  Blood pressure on presentation was 210/110, pulse 131, respirations 24, afebrile, satting 88% on room air. GENERAL:  Alert, oriented female with some mild shortness of breath. EYES:  Pink conjunctiva.  PERRLA. ENT:  Moist oral mucosa.  Trachea midline. NECK:  Supple, mild JVD. LUNGS:  Positive wheezing.  No use of accessory muscles. CARDIOVASCULAR:  No murmurs, regurs, or murmurs. ABDOMEN:  Soft, obese.  Positive bowel sounds.  Nontender and nondistended and the absence of organomegaly secondary to body habitus. Not acute surgical abdomen. NEURO:  Cranial nerves II-XII grossly intact.   Sensation intact. Strength is 5/5 in all extremities.  No clubbing, cyanosis, or edema appreciated. SKIN:  No subcutaneous crepitations.  No rashes.  No decubitus.  The patient does have edema on the bilateral lower extremities.  LABORATORY DATA:  White blood count 13.7, hemoglobin 12.7, platelets 253.  Sodium 137, potassium 3.2, chloride 98, BUN less than 3, creatinine 0.9.  Chest x-ray shows diffuse bilateral airspace disease compatible with pulmonary edema.  EKG shows sinus tachycardia with heart rate of 124.  The patient also has a left bundle-branch block.  The patient had a heart cath done June 02, 2011, which shows normal coronaries with a normal EF of 50%-55%, severe hypertension.  ASSESSMENT AND PLAN: 1. Malignant hypertension.  We will order Lasix 40 mg IV every 12     hours.  We will also place the Foley. 2. Acute diastolic congestive heart failure with ejection fraction of     50% to 55%.  The patient will be admitted to ICU.  She has already     been started on nitroglycerin drip.  We will continue the patient's     home medications of metoprolol 100 mg p.o. b.i.d. plus, we will     find other home medications and reconcile. I am sure the patient    has already been worked up for [employer] of hypertension over the     course of these many years, but I will order a renal artery     ultrasound to rule out renal artery stenosis. If workup for     secondary hypertension proves to be negative.  We would consider     adding minoxidil to the patient's home regimen for better blood     pressure control.  The patient has been counseled on the fact that     she does need to be more proactive with her blood pressure care.     She needs to get a blood pressure monitor and monitor her blood     pressure. 3. Morbid obesity, BMI greater than 40. 4. Diabetes mellitus, resume home medication, ADA diet, sliding scale     insulin have been ordered. 5. Asthma.  Monitor.  Nebulizers  will be ordered.  DVT prophylaxis has     been ordered.  No ACE inhibitor.  We noted that the patient's EF is     within the normal range. 6. Hypokalemia.  We will go ahead and replete.          ______________________________ Gery Pray, MD     DC/MEDQ  D:  07/15/2011  T:  07/15/2011  Job:  [id number]  Electronically Signed by Gery Pray MD on 07/16/2011 02:12:19 AM

## 2011-07-17 ENCOUNTER — Inpatient Hospital Stay (HOSPITAL_COMMUNITY): Payer: Medicaid Other

## 2011-07-17 DIAGNOSIS — I517 Cardiomegaly: Secondary | ICD-10-CM

## 2011-07-17 LAB — COMPREHENSIVE METABOLIC PANEL
ALT: 15 U/L (ref 0–35)
AST: 13 U/L (ref 0–37)
Albumin: 4 g/dL (ref 3.5–5.2)
Alkaline Phosphatase: 79 U/L (ref 39–117)
Potassium: 4.2 mEq/L (ref 3.5–5.1)
Sodium: 134 mEq/L — ABNORMAL LOW (ref 135–145)
Total Protein: 7.6 g/dL (ref 6.0–8.3)

## 2011-07-17 LAB — CBC
HCT: 37.1 % (ref 36.0–46.0)
Platelets: 254 10*3/uL (ref 150–400)
RDW: 13.1 % (ref 11.5–15.5)
WBC: 11 10*3/uL — ABNORMAL HIGH (ref 4.0–10.5)

## 2011-07-17 LAB — GLUCOSE, CAPILLARY: Glucose-Capillary: 217 mg/dL — ABNORMAL HIGH (ref 70–99)

## 2011-07-17 LAB — BASIC METABOLIC PANEL
BUN: 11 mg/dL (ref 6–23)
Chloride: 93 mEq/L — ABNORMAL LOW (ref 96–112)
GFR calc Af Amer: >60 mL/min (ref >60)
Potassium: 3.9 mEq/L (ref 3.5–5.1)

## 2011-07-18 LAB — GLUCOSE, CAPILLARY
Glucose-Capillary: 197 mg/dL — ABNORMAL HIGH (ref 70–99)
Glucose-Capillary: 176 mg/dL — ABNORMAL HIGH (ref 70–99)

## 2011-07-18 NOTE — Discharge Summary (Signed)
[name], [name] NO.:  [id number]  MEDICAL RECORD NO.:  [id number]  LOCATION:  4715                         FACILITY:  [facility]  PHYSICIAN:  Standley Dakins, MD   DATE OF BIRTH:  May 27, 1966  DATE OF ADMISSION:  07/14/2011 DATE OF DISCHARGE:  07/18/2011                              DISCHARGE SUMMARY   PRIMARY CARE PHYSICIAN:  Georganna Skeans, MD at [facility].  CARDIOLOGIST:  Marca Ancona, MD  DISCHARGE DIAGNOSES: 1. Poorly-controlled type 2 diabetes mellitus. 2. Hypertensive emergency -  resolved. 3. Poorly-controlled hypertension. 4. Asthma. 5. Chronic back pain. 6. Poor compliance with taking medications. 7. Fluid overload. 8. Acute diastolic congestive heart failure - treated. 9. Hypokalemia - treated. 10.Morbid obesity, BMI greater than 40. 11.Constipation. 12.Chest pain - resolved. 13.Pulmonary edema - treated.  DISCHARGE MEDICATIONS: 1. Furosemide 40 mg one p.o. daily. 2. Metoprolol 100 mg p.o. b.i.d. 3. NovoLog insulin 70/30 15 units subcu b.i.d. with meals. 4. Aspirin 325 mg p.o. daily. 5. Citalopram 20 mg p.o. every morning. 6. Metformin 1000 mg one p.o. twice daily with meals. 7. Pantoprazole 40 mg 1 tablet by mouth daily. 8. Ventolin 2 puffs inhaled every 4 hours p.r.n. wheezing and     shortness of breath. 9. Potassium chloride 10 mEq one p.o. daily.  HOSPITAL COURSE:  Briefly, this patient is a 45 year old female who presented with shortness of breath and chest tightness.  She was found to have a very high blood pressure of 210/110 and admitted with hypertensive emergency and treated aggressively.  The patient admitted that she had not been taking her medications like prescribed.  The patient says she was having difficulty managing her medications and being able to afford them.  We started her back on metoprolol 100 mg b.i.d. and her blood pressure did very well.  She had no recurrence of uncontrolled hypertension on this  regimen.  She was also started on furosemide 40 mg daily and tolerated that very well.  She had pulmonary edema which resolved with the furosemide therapy.  We also replaced her potassium with oral supplements.    We treated her diabetes mellitus with insulin.  Her blood sugars have been uncontrolled with an A1c of greater than 11%.  She is going to be restarted on metformin and NovoLog insulin 70/30 15 units b.i.d. with meals and she will follow with her primary care physician to titrate medications and make adjustments as required. I made arrangements for her to have a 3-day supply for medications provided at discharge and all of her medications are on the $3 list at [facility] and other pharmacies.  Also I noted that the pantoprazole that she has been taking is not generic at this time and that if she cannot afford it, that she should get Prilosec OTC and take that for acid reflux disease.  The patient verbalized understanding.  DISCHARGE CONDITION:  Stable.  DISPOSITION:  Discharged home with family members.  ACTIVITY:  Ad lib with 30 minutes of physical activity recommended daily.  DIET:  Cardiac, diabetic, and low-sodium diet recommended.  FOLLOW UP:  Follow up with Dr. Andrey Campanile at [facility] in 1-2 weeks.  I have recommended that the patient do not  miss that appointment because it is so important for her to have close outpatient followup.  The patient verbalized understanding.  SPECIAL INSTRUCTIONS: 1. Please check blood sugars three times per day and p.r.n. 2. Please take medications as prescribed.  Do not avoid taking     medications. 3. Please follow the recommended diet plan. 4. Please participate in physical activity at least 30 minutes per     day.  TESTING DONE IN THE HOSPITAL:  The patient had an echocardiogram that revealed mild-to-moderate LVH with moderate asymmetric hypertrophy of the septum.  Systolic function was normal to mildly reduced with an estimated  ejection fraction in the range of 45% to 50%.  There was mild- to-moderate hypokinesis of the anterior septal myocardium.  A calcified annulus of the mitral valve was noted and the left atrium was mildly dilated.  Please see full report for further details.  I spent 40 minutes preparing this patient's discharge including consulting with the case managers for arranging medications, counseling the patient, dictating a discharge summary and preparing discharge prescriptions.     Standley Dakins, MD     CJ/MEDQ  D:  07/18/2011  T:  07/18/2011  Job:  [id number]  cc:   Georganna Skeans, MD Marca Ancona, MD  Electronically Signed by Standley Dakins  on 07/18/2011 09:43:18 PM

## 2011-07-20 ENCOUNTER — Inpatient Hospital Stay (HOSPITAL_COMMUNITY)
Admission: EM | Admit: 2011-07-20 | Discharge: 2011-07-23 | DRG: 292 | Disposition: A | Discharge status: Home Health | Payer: Medicaid Other | Attending: Internal Medicine | Admitting: Internal Medicine

## 2011-07-20 DIAGNOSIS — M549 Dorsalgia, unspecified: Secondary | ICD-10-CM | POA: Diagnosis present

## 2011-07-20 DIAGNOSIS — G8929 Other chronic pain: Secondary | ICD-10-CM | POA: Diagnosis present

## 2011-07-20 DIAGNOSIS — E662 Morbid (severe) obesity with alveolar hypoventilation: Secondary | ICD-10-CM | POA: Diagnosis present

## 2011-07-20 DIAGNOSIS — J984 Other disorders of lung: Secondary | ICD-10-CM | POA: Diagnosis present

## 2011-07-20 DIAGNOSIS — R5381 Other malaise: Secondary | ICD-10-CM | POA: Diagnosis present

## 2011-07-20 DIAGNOSIS — I509 Heart failure, unspecified: Secondary | ICD-10-CM | POA: Diagnosis present

## 2011-07-20 DIAGNOSIS — I10 Essential (primary) hypertension: Secondary | ICD-10-CM | POA: Diagnosis present

## 2011-07-20 DIAGNOSIS — I5043 Acute on chronic combined systolic (congestive) and diastolic (congestive) heart failure: Principal | ICD-10-CM | POA: Diagnosis present

## 2011-07-20 DIAGNOSIS — I2789 Other specified pulmonary heart diseases: Secondary | ICD-10-CM | POA: Diagnosis present

## 2011-07-20 DIAGNOSIS — IMO0001 Reserved for inherently not codable concepts without codable children: Secondary | ICD-10-CM | POA: Diagnosis present

## 2011-07-20 DIAGNOSIS — Z79899 Other long term (current) drug therapy: Secondary | ICD-10-CM

## 2011-07-20 DIAGNOSIS — G4733 Obstructive sleep apnea (adult) (pediatric): Secondary | ICD-10-CM | POA: Diagnosis present

## 2011-07-21 ENCOUNTER — Emergency Department (HOSPITAL_COMMUNITY): Payer: Medicaid Other

## 2011-07-21 DIAGNOSIS — G4710 Hypersomnia, unspecified: Secondary | ICD-10-CM

## 2011-07-21 DIAGNOSIS — G4730 Sleep apnea, unspecified: Secondary | ICD-10-CM

## 2011-07-21 DIAGNOSIS — R0902 Hypoxemia: Secondary | ICD-10-CM

## 2011-07-21 LAB — COMPREHENSIVE METABOLIC PANEL
ALT: 13 U/L (ref 0–35)
AST: 12 U/L (ref 0–37)
AST: 12 U/L (ref 0–37)
Albumin: 3.6 g/dL (ref 3.5–5.2)
CO2: 26 mEq/L (ref 19–32)
Calcium: 8.8 mg/dL (ref 8.4–10.5)
Calcium: 9.3 mg/dL (ref 8.4–10.5)
Chloride: 98 mEq/L (ref 96–112)
Creatinine, Ser: 0.72 mg/dL (ref 0.50–1.10)
Creatinine, Ser: 0.75 mg/dL (ref 0.50–1.10)
GFR calc Af Amer: >60 mL/min (ref >60)
GFR calc non Af Amer: >60 mL/min (ref >60)
GFR calc non Af Amer: >60 mL/min (ref >60)
Glucose, Bld: 301 mg/dL — ABNORMAL HIGH (ref 70–99)
Potassium: 3.8 mEq/L (ref 3.5–5.1)
Sodium: 133 mEq/L — ABNORMAL LOW (ref 135–145)
Sodium: 134 mEq/L — ABNORMAL LOW (ref 135–145)
Total Protein: 7.1 g/dL (ref 6.0–8.3)

## 2011-07-21 LAB — HEMOGLOBIN A1C
Hgb A1c MFr Bld: 11.1 % — ABNORMAL HIGH (ref <5.7)
Mean Plasma Glucose: 272 mg/dL — ABNORMAL HIGH (ref <117)

## 2011-07-21 LAB — DIFFERENTIAL
Basophils Absolute: 0 10*3/uL (ref 0.0–0.1)
Basophils Absolute: 0 10*3/uL (ref 0.0–0.1)
Basophils Relative: 0 % (ref 0–1)
Eosinophils Absolute: 0 10*3/uL (ref 0.0–0.7)
Lymphocytes Relative: 43 % (ref 12–46)
Monocytes Absolute: 0.7 10*3/uL (ref 0.1–1.0)
Neutro Abs: 4 10*3/uL (ref 1.7–7.7)
Neutro Abs: 5.8 10*3/uL (ref 1.7–7.7)
Neutrophils Relative %: 63 % (ref 43–77)

## 2011-07-21 LAB — CBC
HCT: 35.8 % — ABNORMAL LOW (ref 36.0–46.0)
Hemoglobin: 11.8 g/dL — ABNORMAL LOW (ref 12.0–15.0)
Hemoglobin: 12 g/dL (ref 12.0–15.0)
MCHC: 32.9 g/dL (ref 30.0–36.0)
Platelets: 231 10*3/uL (ref 150–400)
RBC: 4.31 MIL/uL (ref 3.87–5.11)
RDW: 12.8 % (ref 11.5–15.5)
WBC: 8.5 10*3/uL (ref 4.0–10.5)

## 2011-07-21 LAB — GLUCOSE, CAPILLARY
Glucose-Capillary: 154 mg/dL — ABNORMAL HIGH (ref 70–99)
Glucose-Capillary: 242 mg/dL — ABNORMAL HIGH (ref 70–99)
Glucose-Capillary: 183 mg/dL — ABNORMAL HIGH (ref 70–99)
Glucose-Capillary: 177 mg/dL — ABNORMAL HIGH (ref 70–99)

## 2011-07-21 LAB — PREGNANCY, URINE: Preg Test, Ur: NEGATIVE

## 2011-07-21 LAB — TROPONIN I: Troponin I: <0.3 ng/mL (ref <0.30)

## 2011-07-21 LAB — URINALYSIS, ROUTINE W REFLEX MICROSCOPIC
Ketones, ur: NEGATIVE mg/dL
Leukocytes, UA: NEGATIVE
Nitrite: POSITIVE — AB
Protein, ur: NEGATIVE mg/dL
pH: 6.5 (ref 5.0–8.0)

## 2011-07-21 MED ORDER — IOHEXOL 350 MG/ML SOLN
100.0000 mL | Freq: Once | INTRAVENOUS | Status: AC | PRN
  Administered 2011-07-21: 100 mL via INTRAVENOUS

## 2011-07-22 DIAGNOSIS — G4710 Hypersomnia, unspecified: Secondary | ICD-10-CM

## 2011-07-22 DIAGNOSIS — G4730 Sleep apnea, unspecified: Secondary | ICD-10-CM

## 2011-07-22 LAB — URINE CULTURE: Culture  Setup Time: 201208080859

## 2011-07-22 LAB — GLUCOSE, CAPILLARY: Glucose-Capillary: 162 mg/dL — ABNORMAL HIGH (ref 70–99)

## 2011-07-23 LAB — GLUCOSE, CAPILLARY: Glucose-Capillary: 199 mg/dL — ABNORMAL HIGH (ref 70–99)

## 2011-07-27 NOTE — Discharge Summary (Signed)
  NAMEARIELIS, [name] NO.:  [id number]  MEDICAL RECORD NO.:  [id number]  LOCATION:  3732                         FACILITY:  [facility]  PHYSICIAN:  Lonia Blood, M.D.       DATE OF BIRTH:  11-16-1966  DATE OF ADMISSION:  07/20/2011 DATE OF DISCHARGE:  07/23/2011                              DISCHARGE SUMMARY   PRIMARY CARE PHYSICIAN:  Georganna Skeans, MD, [facility].  DISCHARGE DIAGNOSES: 1. Pulmonary edema, resolved. 2. 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 3. Severe hypertension. 4. Obstructive sleep apnea. 5. Diabetes mellitus type 2, uncontrolled. 6. Morbid obesity. 7. Chronic back pain.  DISCHARGE MEDICATIONS: 1. Aspirin 325 mg daily. 2. Celexa 20 mg daily. 3. Lasix 40 mg daily. 4. Insulin 70/30, 20-25 units twice a day, to take 25 units in the     morning and 20 units before supper. 5. Imdur 30 mg daily. 6. Metformin 1000 mg twice a day. 7. Metoprolol 100 grams twice a day. 8. Protonix 40 mg daily. 9. Potassium 10 mEq daily. 10.Albuterol 90 mcg inhaled 2 puffs every 4 hours as needed for     dyspnea.  CONDITION ON DISCHARGE:  [name] was discharged in good condition. She is going to follow up with a primary care physician, Dr. Georganna Skeans at [facility]  PROCEDURE THIS ADMISSION:  The patient underwent a CT angiogram of the chest, which was negative for pulmonary emboli, positive for cardiac enlargement and central vascular congestion.  CONSULTATION THIS ADMISSION:  The patient is seen in consultation by Pulmonary Critical Care Medicine.  HOSPITAL COURSE:  [name], 45 year old woman, with a history of hypertension, presented to the emergency room with acute onset of shortness of breath.  She was diagnosed with pulmonary edema and was admitted to step-down unit.  She had intravenous furosemide given.  She has sublingual nitroglycerin given.  She had 3 sets of cardiac enzymes, not suggesting  myocardial infarction or myocardial injury.  It was felt that the patient was suffering from a sleep apnea and that was the cause for her surge in the blood pressure.  She was placed on nocturnal CPAP and she had improvement in her symptoms.  She was diuresed during this admission and her discharge weight is 135 kg.  The patient will continue on diuretic at home, together with beta-blocker and Imdur.  She was set up with auto CPAP at home for sleep apnea.  It was also noticed that the patient's diabetes was uncontrolled with a hemoglobin A1c of 11.1.  We increased insulin the 70/30, 25 units in the morning and 20 units at night.     Lonia Blood, M.D.     SL/MEDQ  D:  07/24/2011  T:  07/24/2011  Job:  [id number]  cc:   Georganna Skeans, MD  Electronically Signed by Lonia Blood M.D. on 07/27/2011 05:45:38 PM

## 2011-08-04 ENCOUNTER — Emergency Department (HOSPITAL_COMMUNITY): Payer: Self-pay

## 2011-08-04 ENCOUNTER — Emergency Department (HOSPITAL_COMMUNITY)
Admission: EM | Admit: 2011-08-04 | Discharge: 2011-08-04 | Disposition: A | Discharge status: Home / Self Care | Payer: Self-pay | Attending: Emergency Medicine | Admitting: Emergency Medicine

## 2011-08-04 DIAGNOSIS — R0602 Shortness of breath: Secondary | ICD-10-CM | POA: Insufficient documentation

## 2011-08-04 DIAGNOSIS — R000 Tachycardia, unspecified: Secondary | ICD-10-CM | POA: Insufficient documentation

## 2011-08-04 DIAGNOSIS — Z794 Long term (current) use of insulin: Secondary | ICD-10-CM | POA: Insufficient documentation

## 2011-08-04 DIAGNOSIS — I509 Heart failure, unspecified: Secondary | ICD-10-CM | POA: Insufficient documentation

## 2011-08-04 DIAGNOSIS — Z79899 Other long term (current) drug therapy: Secondary | ICD-10-CM | POA: Insufficient documentation

## 2011-08-04 DIAGNOSIS — I10 Essential (primary) hypertension: Secondary | ICD-10-CM | POA: Insufficient documentation

## 2011-08-04 DIAGNOSIS — E119 Type 2 diabetes mellitus without complications: Secondary | ICD-10-CM | POA: Insufficient documentation

## 2011-08-04 DIAGNOSIS — R635 Abnormal weight gain: Secondary | ICD-10-CM | POA: Insufficient documentation

## 2011-08-04 DIAGNOSIS — R609 Edema, unspecified: Secondary | ICD-10-CM

## 2011-08-04 DIAGNOSIS — Z7982 Long term (current) use of aspirin: Secondary | ICD-10-CM | POA: Insufficient documentation

## 2011-08-04 DIAGNOSIS — M79609 Pain in unspecified limb: Secondary | ICD-10-CM

## 2011-08-04 DIAGNOSIS — R079 Chest pain, unspecified: Secondary | ICD-10-CM | POA: Insufficient documentation

## 2011-08-04 LAB — URINALYSIS, ROUTINE W REFLEX MICROSCOPIC
Leukocytes, UA: NEGATIVE
Nitrite: NEGATIVE
Protein, ur: NEGATIVE mg/dL
Specific Gravity, Urine: 1.029 (ref 1.005–1.030)
Urobilinogen, UA: 1 mg/dL (ref 0.0–1.0)

## 2011-08-04 LAB — PROTIME-INR: INR: 0.97 (ref 0.00–1.49)

## 2011-08-04 LAB — DIFFERENTIAL
Eosinophils Absolute: 0.1 10*3/uL (ref 0.0–0.7)
Eosinophils Relative: 1 % (ref 0–5)
Lymphocytes Relative: 37 % (ref 12–46)
Lymphs Abs: 3.1 10*3/uL (ref 0.7–4.0)
Monocytes Relative: 8 % (ref 3–12)

## 2011-08-04 LAB — GLUCOSE, CAPILLARY: Glucose-Capillary: 399 mg/dL — ABNORMAL HIGH (ref 70–99)

## 2011-08-04 LAB — BASIC METABOLIC PANEL
BUN: 8 mg/dL (ref 6–23)
CO2: 24 mEq/L (ref 19–32)
Calcium: 9.3 mg/dL (ref 8.4–10.5)
Chloride: 98 mEq/L (ref 96–112)
Creatinine, Ser: 0.94 mg/dL (ref 0.50–1.10)
Glucose, Bld: 367 mg/dL — ABNORMAL HIGH (ref 70–99)

## 2011-08-04 LAB — URINE MICROSCOPIC-ADD ON

## 2011-08-04 LAB — CBC
HCT: 36.4 % (ref 36.0–46.0)
Hemoglobin: 12.2 g/dL (ref 12.0–15.0)
MCHC: 33.5 g/dL (ref 30.0–36.0)
RBC: 4.43 MIL/uL (ref 3.87–5.11)

## 2011-08-04 LAB — PRO B NATRIURETIC PEPTIDE: Pro B Natriuretic peptide (BNP): 248.5 pg/mL — ABNORMAL HIGH (ref 0–125)

## 2011-08-04 LAB — TROPONIN I: Troponin I: <0.3 ng/mL (ref <0.30)

## 2011-08-04 LAB — APTT: aPTT: 28 seconds (ref 24–37)

## 2011-08-05 LAB — URINE CULTURE
Colony Count: 65000
Culture  Setup Time: 201208221657

## 2011-08-06 ENCOUNTER — Encounter: Payer: Self-pay | Admitting: Pulmonary Disease

## 2011-08-08 ENCOUNTER — Ambulatory Visit (HOSPITAL_BASED_OUTPATIENT_CLINIC_OR_DEPARTMENT_OTHER): Payer: Self-pay | Attending: Pulmonary Disease

## 2011-08-08 DIAGNOSIS — IMO0001 Reserved for inherently not codable concepts without codable children: Secondary | ICD-10-CM | POA: Insufficient documentation

## 2011-08-08 DIAGNOSIS — I509 Heart failure, unspecified: Secondary | ICD-10-CM | POA: Insufficient documentation

## 2011-08-08 DIAGNOSIS — G4733 Obstructive sleep apnea (adult) (pediatric): Secondary | ICD-10-CM | POA: Insufficient documentation

## 2011-08-09 ENCOUNTER — Encounter: Payer: Self-pay | Admitting: Pulmonary Disease

## 2011-08-09 ENCOUNTER — Ambulatory Visit (INDEPENDENT_AMBULATORY_CARE_PROVIDER_SITE_OTHER): Payer: Self-pay | Admitting: Pulmonary Disease

## 2011-08-09 VITALS — BP 140/86 | HR 86 | Temp 98.3°F | Ht 69.0 in | Wt 297.4 lb

## 2011-08-09 DIAGNOSIS — G4710 Hypersomnia, unspecified: Secondary | ICD-10-CM

## 2011-08-09 NOTE — Progress Notes (Addendum)
Subjective:    Patient ID: [name], female    DOB: [birth year], 45 y.o.   MRN: [id number]  HPI The patient is a 45 year old female who I've been asked to see for possible obstructive sleep apnea.  She was recently in the hospital for an episode of congestive heart failure, and was noted to have an abnormal breathing pattern during sleep.  She also has a history of loud snoring, and her family has noted pauses in her breathing as well.  The patient states she does have frequent awakenings at night, and has to sleep in more of an upright position.  She is never rested in the mornings upon arising.  She has had significant issues with inappropriate daytime sleepiness, and will fall asleep easily watching television or reading.  She also has some sleep pressure with driving longer distances.  The patient states that her weight is up 40 pounds over the last 2 years.  The patient was asked we treated with CPAP emperically while in the hospital, and was apparently discharged home on an auto device with a nasal mask.  The patient states that she has been wearing this each night, but frequently awakens after having pulled the mask off her face.  She does not feel this has helped her sleep as of yet.  The patient just had a sleep study last night, and this study has not been scored as of yet.  Sleep Questionnaire:   What time do you typically go to bed?( Between what hours)  10 to 11 pm         How long does it take you to fall asleep?  sometimes go rigt to sleep. here lately it has been longer         How many times during the night do you wake up?  2         What time do you get out of bed to start your day?  1000         Do you drive or operate heavy machinery in your occupation?  No         How much has your weight changed (up or down) over the past two years? (In pounds)  40 lb (18.144 kg)         Have you ever had a sleep study before?   Yes         If yes, location of study?  [facility]         If yes,  date of study?  08/08/2011         Do you currently use CPAP?  No         Do you wear oxygen at any time?  No              Review of Systems  Constitutional: Positive for appetite change and unexpected weight change. Negative for fever.  HENT: Positive for dental problem. Negative for ear pain, nosebleeds, congestion, sore throat, rhinorrhea, sneezing, trouble swallowing, postnasal drip and sinus pressure.   Eyes: Negative for redness and itching.  Respiratory: Positive for shortness of breath. Negative for cough, chest tightness and wheezing.   Cardiovascular: Positive for chest pain, palpitations and leg swelling.  Gastrointestinal: Positive for abdominal pain. Negative for nausea and vomiting.  Genitourinary: Negative for dysuria.  Musculoskeletal: Positive for joint swelling.  Skin: Negative for rash.  Neurological: Positive for headaches.  Hematological: Does not bruise/bleed easily.  Psychiatric/Behavioral: Positive for dysphoric mood. The patient is  nervous/anxious.        Objective:   Physical Exam Constitutional:  Obese female, no acute distress  HENT:  Nares patent without discharge, large turbinates  Oropharynx without exudate, palate and uvula are enlarged, large tonsils  Eyes:  Perrla, eomi, no scleral icterus  Neck:  No JVD, no TMG  Cardiovascular:  Normal rate, regular rhythm, no rubs or gallops.  No murmurs        Intact distal pulses  Pulmonary :  Normal breath sounds, no stridor or respiratory distress   No rales, rhonchi, or wheezing  Abdominal:  Soft, nondistended, bowel sounds present.  No tenderness noted.   Musculoskeletal:  mild lower extremity edema noted.  Lymph Nodes:  No cervical lymphadenopathy noted  Skin:  No cyanosis noted  Neurologic:  Alert, appropriate, moves all 4 extremities without obvious deficit.         Assessment & Plan:

## 2011-08-09 NOTE — Progress Notes (Incomplete Revision)
Subjective:    Patient ID: [name], female    DOB: [birth year], 45 y.o.   MRN: [id number]  HPI The patient is a 45 year old female who I've been asked to see for possible obstructive sleep apnea.  She was recently in the hospital for an episode of congestive heart failure, and was noted to have an abnormal breathing pattern during sleep.  She also has a history of loud snoring, and her family has noted pauses in her breathing as well.  The patient states she does have frequent awakenings at night, and has to sleep in more of an upright position.  She is never rested in the mornings upon arising.  She has had significant issues with inappropriate daytime sleepiness, and will fall asleep easily watching television or reading.  She also has some sleep pressure with driving longer distances.  The patient states that her weight is up 40 pounds over the last 2 years.  The patient was asked we treated with CPAP emperically while in the hospital, and was apparently discharged home on an auto device with a nasal mask.  The patient states that she has been wearing this each night, but frequently awakens after having pulled the mask off her face.  She does not feel this has helped her sleep as of yet.  The patient just had a sleep study last night, and this study has not been scored as of yet.  Sleep Questionnaire:   What time do you typically go to bed?( Between what hours)  10 to 11 pm         How long does it take you to fall asleep?  sometimes go rigt to sleep. here lately it has been longer         How many times during the night do you wake up?  2         What time do you get out of bed to start your day?  1000         Do you drive or operate heavy machinery in your occupation?  No         How much has your weight changed (up or down) over the past two years? (In pounds)  40 lb (18.144 kg)         Have you ever had a sleep study before?   Yes         If yes, location of study?  [facility]         If yes,  date of study?  08/08/2011         Do you currently use CPAP?  No         Do you wear oxygen at any time?  No              Review of Systems  Constitutional: Positive for appetite change and unexpected weight change. Negative for fever.  HENT: Positive for dental problem. Negative for ear pain, nosebleeds, congestion, sore throat, rhinorrhea, sneezing, trouble swallowing, postnasal drip and sinus pressure.   Eyes: Negative for redness and itching.  Respiratory: Positive for shortness of breath. Negative for cough, chest tightness and wheezing.   Cardiovascular: Positive for chest pain, palpitations and leg swelling.  Gastrointestinal: Positive for abdominal pain. Negative for nausea and vomiting.  Genitourinary: Negative for dysuria.  Musculoskeletal: Positive for joint swelling.  Skin: Negative for rash.  Neurological: Positive for headaches.  Hematological: Does not bruise/bleed easily.  Psychiatric/Behavioral: Positive for dysphoric mood. The patient is  nervous/anxious.        Objective:   Physical Exam Constitutional:  Obese female, no acute distress  HENT:  Nares patent without discharge, large turbinates  Oropharynx without exudate, palate and uvula are enlarged, large tonsils  Eyes:  Perrla, eomi, no scleral icterus  Neck:  No JVD, no TMG  Cardiovascular:  Normal rate, regular rhythm, no rubs or gallops.  No murmurs        Intact distal pulses  Pulmonary :  Normal breath sounds, no stridor or respiratory distress   No rales, rhonchi, or wheezing  Abdominal:  Soft, nondistended, bowel sounds present.  No tenderness noted.   Musculoskeletal:  mild lower extremity edema noted.  Lymph Nodes:  No cervical lymphadenopathy noted  Skin:  No cyanosis noted  Neurologic:  Alert, appropriate, moves all 4 extremities without obvious deficit.         Assessment & Plan:

## 2011-08-09 NOTE — Patient Instructions (Signed)
Will review your sleep study when the results are available, and call to discuss Work on weight loss

## 2011-08-09 NOTE — Assessment & Plan Note (Signed)
The patient's history is suggestive of clinically significant sleep apnea.  However, a case could also be made for orthopnea and possible Cheyne-Stokes respirations associated with her congestive heart failure.  She has not tolerated CPAP very well as of yet.  I will get a download off of her C. Pap machine, and also have suggested that she consider a full facemask.  We'll get the results of her sleep study for verification, and discuss the next step with the patient.

## 2011-08-10 ENCOUNTER — Emergency Department (HOSPITAL_COMMUNITY): Payer: Self-pay

## 2011-08-10 ENCOUNTER — Emergency Department (HOSPITAL_COMMUNITY)
Admission: EM | Admit: 2011-08-10 | Discharge: 2011-08-10 | Disposition: A | Discharge status: Home / Self Care | Payer: Self-pay | Attending: Emergency Medicine | Admitting: Emergency Medicine

## 2011-08-10 ENCOUNTER — Telehealth: Payer: Self-pay | Admitting: Cardiology

## 2011-08-10 DIAGNOSIS — Z794 Long term (current) use of insulin: Secondary | ICD-10-CM | POA: Insufficient documentation

## 2011-08-10 DIAGNOSIS — R0602 Shortness of breath: Secondary | ICD-10-CM | POA: Insufficient documentation

## 2011-08-10 DIAGNOSIS — E669 Obesity, unspecified: Secondary | ICD-10-CM | POA: Insufficient documentation

## 2011-08-10 DIAGNOSIS — E119 Type 2 diabetes mellitus without complications: Secondary | ICD-10-CM | POA: Insufficient documentation

## 2011-08-10 DIAGNOSIS — I509 Heart failure, unspecified: Secondary | ICD-10-CM | POA: Insufficient documentation

## 2011-08-10 DIAGNOSIS — I10 Essential (primary) hypertension: Secondary | ICD-10-CM | POA: Insufficient documentation

## 2011-08-10 LAB — BASIC METABOLIC PANEL
BUN: 10 mg/dL (ref 6–23)
Chloride: 96 mEq/L (ref 96–112)
Creatinine, Ser: 0.86 mg/dL (ref 0.50–1.10)
Glucose, Bld: 292 mg/dL — ABNORMAL HIGH (ref 70–99)
Potassium: 3.7 mEq/L (ref 3.5–5.1)

## 2011-08-10 LAB — PRO B NATRIURETIC PEPTIDE: Pro B Natriuretic peptide (BNP): 309 pg/mL — ABNORMAL HIGH (ref 0–125)

## 2011-08-10 LAB — DIFFERENTIAL
Basophils Relative: 0 % (ref 0–1)
Eosinophils Absolute: 0.1 10*3/uL (ref 0.0–0.7)
Lymphs Abs: 3.5 10*3/uL (ref 0.7–4.0)
Monocytes Absolute: 0.8 10*3/uL (ref 0.1–1.0)
Monocytes Relative: 9 % (ref 3–12)
Neutrophils Relative %: 54 % (ref 43–77)

## 2011-08-10 LAB — CBC
Hemoglobin: 12.9 g/dL (ref 12.0–15.0)
MCH: 27.4 pg (ref 26.0–34.0)
MCHC: 33.2 g/dL (ref 30.0–36.0)
MCV: 82.8 fL (ref 78.0–100.0)
Platelets: 283 10*3/uL (ref 150–400)
RBC: 4.7 MIL/uL (ref 3.87–5.11)

## 2011-08-10 LAB — POCT I-STAT TROPONIN I: Troponin i, poc: 0 ng/mL (ref 0.00–0.08)

## 2011-08-10 NOTE — Telephone Encounter (Signed)
Pt calling re fluid buildup, went to er today , legs, face, hands, feet, and ankles are swollen

## 2011-08-10 NOTE — Telephone Encounter (Signed)
Patient states was in the ER today for fluid build up. Patient states her legs, hands face and LE are edematous. She has been going to the ER with the same symptoms several time. Today the  ER Md recommended for to make   a F/U appointment with her cardiologist for CHF. Patient was transferred to [facility] to schedule F/U visit with Dr. Shirlee Latch on 08/17/11.

## 2011-08-10 NOTE — H&P (Signed)
NAMESAE, HANDRICH NO.:  [id number]  MEDICAL RECORD NO.:  [id number]  LOCATION:  6533                         FACILITY:  [facility]  PHYSICIAN:  Bevelyn Buckles. Bensimhon, MDDATE OF BIRTH:  [birth year]  DATE OF ADMISSION:  06/10/2011 DATE OF DISCHARGE:                             HISTORY & PHYSICAL   PRIMARY CARE PHYSICIAN:  At [facility].  PRIMARY CARDIOLOGIST:  Marca Ancona, MD  CHIEF COMPLAINT:  Chest pain.  HISTORY OF PRESENT ILLNESS:  [name] is a 45 year old female with no history of coronary artery disease.  She had onset of substernal chest pain yesterday without activity.  It started 1 hour after eating.  It reached an 8/10.  It was sharp and went through to her back.  She had some shortness of breath and diaphoresis with it.  It resolved in 5 minutes without intervention.  It recurred at approximately 5:00 p.m. She was hungry at that time.  She describes that pain as a soreness and it was not as severe.  It resolved in about the same duration.  After each episode, she complained of fatigue.  Chest pain woke her again at approximately 1:00 a.m. today.  It was the sharp chest pain that had reached an 8/10 but did not resolve.  When her symptoms did not resolve, her family transported her to the emergency room.  In the emergency room, she has received aspirin, Zofran, sublingual nitroglycerin and nitroglycerin paste as well as morphine 4 mg.  She is now almost completely pain free.  Her chest pain might have been a little bit worse with deep inspiration but she does not feel that it changed with position and was not related to activity.  Although she does still complain of some chest pain, she does not appear to be acutely uncomfortable.  PAST MEDICAL HISTORY: 1. History of chest pain with a Lexiscan Myoview in 2011 showing no     scar or ischemia and an EF of 50%. 2. Previous stress test in 1996 reportedly okay. 3. Hypertension. 4. Diabetes. 5.  Hyperlipidemia. 6. Obesity. 7. Family history of coronary artery disease. 8. Gastroesophageal reflux disease. 9. Sinusitis.  SURGICAL HISTORY:  Breast biopsy.  ALLERGIES:  LISINOPRIL, CLINDAMYCIN, and ENALAPRIL.  CURRENT MEDICATIONS: 1. Lopressor 50 mg b.i.d. 2. Coreg 3.125 mg b.i.d. 3. Aspirin 325 mg a day. 4. Triamterene/HCTZ 75/50 daily. 5. Metformin 1000 mg b.i.d. 6. Glucotrol 10 mg b.i.d. 7. Celexa 20 mg a day. 8. Amlodipine 10 mg a day. 9. Avapro 300 mg daily. 10.Cardura 4 mg daily. 11.Protonix 40 mg daily 12.Verapamil SR 240 mg a day. 13.Ventolin inhaler q.4 h. p.r.n.  SOCIAL HISTORY:  She lives in [place] with family.  She is unemployed.  She has no history of alcohol, tobacco, or drug abuse.  FAMILY HISTORY:  Mother died of 49 with heart failure and hypertension on dialysis.  Her father reportedly had an MI in his 31s.  She also has at least 1 sibling with coronary artery disease.  REVIEW OF SYSTEMS:  She has sweats.  She has chronic arthralgias and joint pains.  She has had weakness and fatigue.  She has occasional reflux symptoms but no melena.  She  has not had fevers or chills.  She has occasional palpitations but no presyncope or syncope.  Full 14 point review of systems is otherwise negative except as stated in the HPI.  PHYSICAL EXAMINATION:  VITAL SIGNS:  Temperature is 97.5, blood pressure 158/95, pulse 113, respiratory rate 18, O2 saturation 97% on room air. GENERAL:  She is a well-developed obese African American female in no acute distress. HEENT:  Normal. NECK:  There is no lymphadenopathy, thyromegaly, bruit, or JVD noted. CV:  Her heart is regular in rate and rhythm with an S1, S2 and no significant murmur, rub, or gallop is noted.  Distal pulses are intact in all 4 extremities. LUNGS:  Clear to auscultation bilaterally. SKIN:  No rashes or lesions are noted. ABDOMEN:  Soft and nontender with active bowel sounds. EXTREMITIES:  There is no  cyanosis or clubbing and only trace edema. MUSCULOSKELETAL:  There is no joint deformity or effusions and no spine or CVA tenderness. NEURO:  She is alert and oriented.  Cranial nerves II-XII grossly intact.  CHEST X-RAY:  No acute disease.  EKG:  Sinus tach rate 115 with a left bundle-branch block which is different from an EKG dated May 2012.  LABORATORY VALUES:  Hemoglobin 13.2, hematocrit 39.2, WBCs 9.8, platelets 258000.  Sodium 133, potassium 3.4, chloride 95, CO2 25, BUN 7, creatinine 0.74, glucose 263, CK-MB and troponin I negative x2.  IMPRESSION:  [name] was seen today by Dr. Gala Romney.  The patient evaluated and the data reviewed.  Her chest pain has typical and atypical features.  She has multiple cardiac risk factors.  Cardiac enzymes are negative but she has a new left bundle when compared to an EKG dated May 2012. Because of ongoing symptoms and an abnormal EKG, cardiac catheterization is indicated.  Since her cardiac enzymes have remained negative, this does not qualify as an ST elevation MI.  She will be done today.     Theodore Demark, PA-C   ______________________________ Bevelyn Buckles. Bensimhon, MD    RB/MEDQ  D:  06/10/2011  T:  06/11/2011  Job:  [id number]  Electronically Signed by Theodore Demark PA-C on 06/29/2011 04:17:31 PM Electronically Signed by Arvilla Meres MD on 08/10/2011 05:35:39 PM

## 2011-08-10 NOTE — Telephone Encounter (Signed)
Left a message to call back.

## 2011-08-17 ENCOUNTER — Encounter: Payer: Self-pay | Admitting: Cardiology

## 2011-08-17 ENCOUNTER — Encounter: Payer: Medicaid Other | Attending: Physical Medicine & Rehabilitation | Admitting: Physical Medicine & Rehabilitation

## 2011-08-17 ENCOUNTER — Ambulatory Visit (INDEPENDENT_AMBULATORY_CARE_PROVIDER_SITE_OTHER): Payer: Self-pay | Admitting: Cardiology

## 2011-08-17 DIAGNOSIS — I5032 Chronic diastolic (congestive) heart failure: Secondary | ICD-10-CM

## 2011-08-17 DIAGNOSIS — I10 Essential (primary) hypertension: Secondary | ICD-10-CM

## 2011-08-17 DIAGNOSIS — I509 Heart failure, unspecified: Secondary | ICD-10-CM

## 2011-08-17 MED ORDER — AMLODIPINE BESYLATE 5 MG PO TABS: 5.0000 mg | ORAL_TABLET | Freq: Every day | ORAL | Status: DC

## 2011-08-17 NOTE — H&P (Signed)
NAMELONDON, NONAKA NO.:  [id number]  MEDICAL RECORD NO.:  [id number]  LOCATION:  [facility]                         FACILITY:  [facility]  PHYSICIAN:  Eduard Clos, MDDATE OF BIRTH:  [birth year]  DATE OF ADMISSION:  07/20/2011 DATE OF DISCHARGE:                             HISTORY & PHYSICAL   PRIMARY CARE PHYSICIAN:  Georganna Skeans, MD at [facility].  CARDIOLOGIST:  Marca Ancona, MD  CHIEF COMPLAINT:  Shortness of breath and chest pain.  HISTORY OF PRESENTING ILLNESS:  A 45 year old female with morbid obesity, hypertension, diabetes mellitus type 2, was just recently admitted for accelerated hypertension and uncontrolled diabetes with pulmonary edema, was discharged just 2 days ago, comes back with shortness of breath and chest pain which started happening from last evening.  The chest pain is pressure-like, radiating to back.  In addition, the patient also had some leg pain and the patient had been short of breath.  In the ER, the patient had a chest x-ray which is showing vascular crowding.  At this time, the patient has been admitted for chest pain and shortness of breath with possible ingestion  The patient had some nausea but denies any vomiting, abdominal pain, dysuria, discharge, diarrhea.  Denies any dizziness or loss of consciousness or any headache or focal deficit.  The patient states she has been taking medicines as advised.  The pain has been persistent at this time.  The patient is placed on nitroglycerin.  Lasix 40 IV was given.  The patient will be admitted for further observation.  PAST MEDICAL HISTORY: 1. Hypertension. 2. Diabetes mellitus type 2. 3. Diastolic heart failure. 4. History of cardiac cath on June 10, 2011 which showed normal LV,     normal coronary arteries. 5. History of morbid obesity. 6. History of chronic back pain.  PAST SURGICAL HISTORY:  Breast biopsies.  MEDICATIONS ON ADMISSION: 1. Ventolin inhaler 2 puffs  q.4 h. as needed. 2. Potassium chloride 10 mEq p.o. daily. 3. Protonix 40 mg daily. 4. NovoLog 70/30 insulin 15 units twice daily with meals. 5. Metoprolol 100 mg twice daily. 6. Metformin 1000 mg p.o. twice daily. 7. Furosemide 40 mg daily. 8. Citalopram 20 mg daily. 9. Aspirin 325 mg p.o. daily.  ALLERGIES: 1. ENALAPRIL. 2. CLINDAMYCIN.  SOCIAL HISTORY:  The patient denied smoking cigarettes, drinking alcohol, or using illegal drugs.  FAMILY HISTORY:  Significant for hypertension, history of renal failure, diabetes mellitus, and breast cancer in aunt and grandmother.  REVIEW OF SYSTEMS:  As present in the history of presenting illness. Nothing else significant.  PHYSICAL EXAMINATION:  GENERAL:  The patient examined at bedside, not in acute distress. VITAL SIGNS:  Blood pressure is 135/78, pulse 94 per minute, temperature 98.9, respirations 18 per minute, O2 sat is 98%.  HEENT:  Anicteric.  No pallor.  No discharge from ears, eyes, nose, or mouth. CHEST:  Bilateral air entry present.  No rhonchi.  No crepitation. HEART:  S1, S2 heard. ABDOMEN:  Soft, nontender.  Bowel sounds heard. CNS:  The patient is alert, awake, oriented to time, place, and person. Moves upper and lower extremities 5/5. EXTREMITIES:  Peripheral pulses felt.  No edema.  LABORATORY  DATA:  EKG shows sinus tachycardia, beats around 101 beats per minute with nonspecific interventricular conduction block with nonspecific ST-T changes.  Chest x-ray shows low lung volumes with vascular crowding and streaky atelectasis. CBC:  WBC of 8.5, hemoglobin is 12, hematocrit is 35.8, platelets 228.  Complete metabolic panel: Sodium 133, potassium 3.8, chloride 98, carbon dioxide 29, glucose 301, BUN 7, creatinine 0.7, total bilirubin is 0.4, alkaline phosphatase 69, AST 12, ALT 14, total protein 7.1, albumin 3.7, calcium 9.3.  Troponin less than 0.3.  BNP 289.2.  UA is showing positive nitrites, glucose more than 1000,  ketones negative, bacteria many, leukocytes negative, wbc's zero, rbc's zero.  ASSESSMENT: 1. Chest pain with a negative cardiac catheterization 2 months ago     showing normal coronary arteries. 2. Shortness of breath. 3. Hypertension. 4. Diabetes mellitus type 2, uncontrolled. 5. Morbid obesity. 6. History of asthma.  PLAN: 1. At this time, we will admit the patient to step-down unit because     of the persistence of chest pain. 2. For her chest pain at this time, we will cycle cardiac markers.     Continue her aspirin and nitroglycerin paste.  The patient did have     normal coronary arteries in a recent cardiac cath.  This is     unlikely to be CAD but we will rule out by doing cardiac enzymes.     At this time, because of the persistent nature of chest pain, I     going to get a CT angio chest. 3. Shortness of breath could be from her hypertension and also has a     history of diastolic CHF.  At this time, Lasix 40 IV was given. We     will continue with Lasix q.12 h. along with metoprolol.  At this     time, the patient does not have any overt peripheral edema.  We     will check intake, output, and daily weights closely along with it     a BMET. 4. Uncontrolled diabetes.  The patient states she has been taking her     medicines as advised.  At this time, continue her regular dose of     NovoLog 70/30, but at this moment, I am going to give Lantus 5     units subcutaneous 1 dose.  Closely follow a CBG with sensitive     sliding scale coverage.  Hemoglobin A1c in June was 11.1.  We will     recheck again this time. 5. Urine is showing mild nitrites.  She does not complain of any     dysuria.  At this time, we are going to get urine cultures. 6. Further recommendation based on tests ordered and clinical course.     Eduard Clos, MD     ANK/MEDQ  D:  07/21/2011  T:  07/21/2011  Job:  [id number]  cc:   Georganna Skeans, MD Marca Ancona, MD  Electronically Signed  by Midge Minium MD on 08/17/2011 09:07:47 AM

## 2011-08-17 NOTE — Assessment & Plan Note (Signed)
BP continues to run high.  She is only taking Toprol XL 100 mg bid for her blood pressure.  She has a history of angioedema with ACEI so cannot take ACEI or ARB.  She is compliant with CPAP.  I think that her poorly controlled blood pressure is the most significant contributor to her diastolic CHF.   - Add amlodipine 5 mg daily.  - Followup in 2 wks with Tereso Newcomer, will titrate up BP meds at that time.  - Workup for secondary HTN has never been done.  She did first develop HTN at age 45 per her report.  I will get renal artery doppler US to evaluate for fibromuscular dysplasia causing renal artery stenosis, urinary catecholeamines for pheochromocytoma, and serum aldosterone/renin levels for hyperaldosteronemia.   - If additional agent is needed after amlodipine, would consider spironolactone for possible aldosterone-dependent hypertension.

## 2011-08-17 NOTE — Assessment & Plan Note (Signed)
Primarily diastolic CHF (EF 16-10% by echo, 50-55% by LV-gram).  Lasix was recently increased and she says she has been losing weight.  She is still volume overloaded with NYHA class III symptoms.  Will continue Lasix 80 mg daily for now since it was started Friday.  If she still appears volume overloaded at followup in 2 weeks and BMET is ok at that time, will increase Lasix to 80 qam, 40 qpm.   Patient will need lipids/LFTs (fasting).  Goal LDL at least < 100 with diabetes.

## 2011-08-17 NOTE — Progress Notes (Signed)
PCP: Georganna Skeans  45 yo with history of primarily diastolic CHF in the setting of poorly-controlled HTN presents for cardiology followup.  She has been had multiple times this year.  She was admitted in 6/12 with chest pain and new LBBB and ended up getting a left heart cath, which showed no angiographic CAD.  She was admitted in 8/12 with chest pain/dyspnea and had a CT angiogram of the chest negative for PE.  Blood pressure was controlled and she was diuresed and started on CPAP.  Echo showed EF 45-50% with LV hypertrophy.    Currently, she is short of breath after walking about 50 yards or walking up a flight of steps.  She has significant orthopnea, requiring 4 pillows.  She is trying to avoid sodium and is using her CPAP.  No further chest pain.  Her PCP increased Lasix to 80 mg daily last Friday and her weight has come down some since then.    ECG: sinus tachycardia at 103, LBBB, LAE  Labs (6/12): hgbA1c 11.1 Labs (8/12): pro-BNP 309, K 3.7, creatinine 0.86  PMH: 1. Chest pain: Left heart cath in 6/12 with no angiographic CAD.  2. LBBB 3. Primarily diastolic CHF: Likely due to uncontrolled HTN.  Last echo (8/12) with EF 45-50%, mild to moderate LVH with some asymmetric septal hypertrophy, RV normal size and systolic function.  EF 50-55% by LV-gram in 6/12.  4. HTN: Poorly controlled.  Has had HTN since age 29.  Angioedema with ACEI.  5. Diabetes: Poorly controlled.  6. OSA on CPAP  SH: Unable to work and has applied for disability.  Nonsmoker.  No ETOH.   FH: Mother died from CHF in her 67s (renal failure, HTN).  Father with CHF, HTN.   ROS: All systems reviewed and negative except as per HPI.   Current Outpatient Prescriptions  Medication Sig Dispense Refill  . albuterol (VENTOLIN HFA) 108 (90 BASE) MCG/ACT inhaler Inhale 2 puffs into the lungs every 6 (six) hours as needed.        [name] aspirin 325 MG tablet Take 325 mg by mouth daily.        . citalopram (CELEXA) 20 MG tablet Take  20 mg by mouth daily.        . furosemide (LASIX) 40 MG tablet Take 80 mg by mouth daily.       . insulin aspart protamine-insulin aspart (NOVOLOG 70/30) (70-30) 100 UNIT/ML injection Inject 15 Units into the skin 2 (two) times daily with a meal.        . metFORMIN (GLUMETZA) 1000 MG (MOD) 24 hr tablet Take 1,000 mg by mouth 2 (two) times daily with a meal.        . metoprolol (TOPROL-XL) 100 MG 24 hr tablet Take 100 mg by mouth 2 (two) times daily.        . pantoprazole (PROTONIX) 40 MG tablet Take 40 mg by mouth daily.        . potassium chloride (KLOR-CON) 10 MEQ CR tablet Take 10 mEq by mouth daily.        [name] amLODipine (NORVASC) 5 MG tablet Take 1 tablet (5 mg total) by mouth daily.  30 tablet  11    BP 171/114  Pulse 103  Ht 5\' 9"  (1.753 m)  Wt 295 lb 12.8 oz (134.174 kg)  BMI 43.68 kg/m2 General: NAD, obese.  Neck: JVP 8-9 cm, no thyromegaly or thyroid nodule.  Lungs: Clear to auscultation bilaterally with normal respiratory effort. CV:  Nondisplaced PMI.  Heart regular S1/S2, no S3/S4, no murmur.  Trace ankle edema.  No carotid bruit.  Normal pedal pulses.  Abdomen: Soft, nontender, no hepatosplenomegaly, no distention.  Neurologic: Alert and oriented x 3.  Psych: Normal affect. Extremities: No clubbing or cyanosis.

## 2011-08-17 NOTE — Patient Instructions (Signed)
Start Amlodipine(norvasc) 5mg  daily.  Your physician recommends that you return for lab work --serum aldosterone and renin/ 24 hour urine for catecholamines-fractionated 428.32  401.1  Your physician has requested that you have a renal artery duplex. During this test, an ultrasound is used to evaluate blood flow to the kidneys. Allow one hour for this exam. Do not eat after midnight the day before and avoid carbonated beverages. Take your medications as you usually do.    Your physician recommends that you schedule a follow-up appointment in: 2 weeks with Tereso Newcomer, PA-c   Your physician recommends that you schedule a follow-up appointment in: 1 month with Dr Shirlee Latch.

## 2011-08-18 ENCOUNTER — Other Ambulatory Visit: Payer: Self-pay | Admitting: *Deleted

## 2011-08-18 DIAGNOSIS — I5032 Chronic diastolic (congestive) heart failure: Secondary | ICD-10-CM

## 2011-08-18 DIAGNOSIS — I10 Essential (primary) hypertension: Secondary | ICD-10-CM

## 2011-08-18 DIAGNOSIS — G4710 Hypersomnia, unspecified: Secondary | ICD-10-CM

## 2011-08-18 DIAGNOSIS — G4730 Sleep apnea, unspecified: Secondary | ICD-10-CM

## 2011-08-19 NOTE — Procedures (Signed)
NAME:  [name], [name] NO.:  [id number]  MEDICAL RECORD NO.:  [id number]          PATIENT TYPE:  OUT  LOCATION:  SLEEP CENTER                 FACILITY:  [facility]  PHYSICIAN:  Oretha Milch, MD      DATE OF BIRTH:  10-16-1966  DATE OF STUDY:  08/08/2011                           NOCTURNAL POLYSOMNOGRAM  REFERRING PHYSICIAN:  Felipa Evener, MD  INDICATION FOR STUDY:  Ms. Dirden is a 45 year old obese woman who was hospitalized in August 2012, for congestive heart failure, improved with positive airway pressure and diuretic.  She was discharged on nocturnal CPAP therapy.  She also had a uncontrolled diabetes.  At the time of the study, she weighed 298 pounds with a BMI of 44, height of 5 feet 9 inches, neck size 17 inches.  This nocturnal polysomnogram was performed with the sleep technologist in attendance.  EEG, EOG, EMG, EKG and respiratory parameters were recorded.  Sleep stages, arousals, limb movements and respiratory data were scored according to criteria laid out by the American Academy of Sleep Medicine.  EPWORTH SLEEPINESS SCORE:  9.  MEDICATIONS:  SLEEP ARCHITECTURE:  Lights out was at 9:45 p.m.  Lights on was at 3:46 a.m.  Total sleep time was 261 minutes with a sleep period time of 276 minutes and a sleep efficiency of 72%.  Sleep latency was 5 minutes. Latency to REM sleep was 80 minutes and wake after sleep onset was 94 minutes.  Sleep stages as a percentage of total sleep time was N1 10%, N2 65%, N3 13% REM sleep 15% (40 minutes) supine sleep accounted for 61 minutes.  No supine REM sleep was noted.  Longest period of REM sleep was around 1 a.m.  AROUSAL DATA:  There were total of 79 arousals with an arousal index of 18 events per hour of these 56 were spontaneous and the rest were associated with respiratory events.  LIMB MOVEMENT DATA:  No significant limb movements were noted.  RESPIRATORY DATA:  There were total of 3 obstructive apneas,  9 central apneas, 1 mixed apnea and 54 hypopneas with an apnea-hypopnea index of 15 events per hour, 12 respiratory effort related arousals (RERAs were noted) with an RDI of 18 events per hour.  The longest apnea was 18 seconds.  Longest hypopneas 46 seconds.  Events seem to be worse during REM sleep and were not related to body position.  OXYGEN SATURATION DATA:  The desaturation index was 25 events per hour. The lowest desaturation was 83% during non-REM sleep.  She spent 7 minutes with a saturation less than 88%.  CARDIAC DATA:  The low heart rate was 31 beats per minute.  The high heart rate recorded was an artifact.  No arrhythmias were noted.  DISCUSSION:  CPAP was not initiated due to insufficient sleep time. Note the long periods of arousal.  IMPRESSION: 1. Mild-to-moderate obstructive sleep apneas with hypopneas causing     sleep fragmentation and oxygen desaturation.  These were worse     during REM sleep. 2. No evidence of cardiac arrhythmias, limb movements or behavioral     disturbance during sleep.  RECOMMENDATIONS: 1. The treatment option for this degree  of sleep disordered breathing     include weight loss, CPAP therapy or oral appliances. 2. AutoCPAP titration can be performed and  CPAP setting  based on     download results. 3. She should be cautioned against driving when sleepy.  She should be     advised against medications with sedative side effects.     Oretha Milch, MD Electronically Signed    RVA/MEDQ  D:  08/18/2011 14:55:27  T:  08/18/2011 22:49:36  Job:  [id number]

## 2011-08-22 ENCOUNTER — Telehealth: Payer: Self-pay | Admitting: Pulmonary Disease

## 2011-08-22 NOTE — Telephone Encounter (Signed)
Megan, need this pt's sleep study that was recently read by [facility] Her compliance on cpap download was very poor.  Need asap, please hand to me personally so I can address.

## 2011-08-23 ENCOUNTER — Other Ambulatory Visit: Payer: Self-pay | Admitting: *Deleted

## 2011-08-23 NOTE — Telephone Encounter (Signed)
Pt needs ov to review her sleep study.  Thanks.  

## 2011-08-23 NOTE — Telephone Encounter (Signed)
Called and spoke with Daneen at the sleep lab and requested sleep study be faxed to triage fax #.

## 2011-08-23 NOTE — Telephone Encounter (Signed)
Received results and hand delivered to [facility].

## 2011-08-24 NOTE — Telephone Encounter (Signed)
[id number] 

## 2011-08-25 NOTE — Telephone Encounter (Signed)
Called and spoke with pt.  Pt scheduled to see [facility] 9/14 at 11: 45 am

## 2011-08-27 ENCOUNTER — Ambulatory Visit (INDEPENDENT_AMBULATORY_CARE_PROVIDER_SITE_OTHER): Payer: Self-pay | Admitting: Pulmonary Disease

## 2011-08-27 ENCOUNTER — Other Ambulatory Visit (INDEPENDENT_AMBULATORY_CARE_PROVIDER_SITE_OTHER): Payer: Self-pay | Admitting: *Deleted

## 2011-08-27 ENCOUNTER — Encounter: Payer: Self-pay | Admitting: Pulmonary Disease

## 2011-08-27 ENCOUNTER — Other Ambulatory Visit: Payer: Self-pay | Admitting: Cardiology

## 2011-08-27 VITALS — BP 124/82 | HR 82 | Temp 98.0°F | Ht 69.0 in | Wt 294.4 lb

## 2011-08-27 DIAGNOSIS — I10 Essential (primary) hypertension: Secondary | ICD-10-CM

## 2011-08-27 DIAGNOSIS — I5032 Chronic diastolic (congestive) heart failure: Secondary | ICD-10-CM

## 2011-08-27 DIAGNOSIS — G4733 Obstructive sleep apnea (adult) (pediatric): Secondary | ICD-10-CM

## 2011-08-27 DIAGNOSIS — I509 Heart failure, unspecified: Secondary | ICD-10-CM

## 2011-08-27 LAB — HEPATIC FUNCTION PANEL
ALT: 20 U/L (ref 0–35)
Albumin: 4.2 g/dL (ref 3.5–5.2)
Alkaline Phosphatase: 65 U/L (ref 39–117)
Total Protein: 7.7 g/dL (ref 6.0–8.3)

## 2011-08-27 LAB — BASIC METABOLIC PANEL
CO2: 28 mEq/L (ref 19–32)
Calcium: 9 mg/dL (ref 8.4–10.5)
Chloride: 100 mEq/L (ref 96–112)
Glucose, Bld: 208 mg/dL — ABNORMAL HIGH (ref 70–99)
Sodium: 136 mEq/L (ref 135–145)

## 2011-08-27 LAB — LIPID PANEL: HDL: 49 mg/dL (ref >39.00)

## 2011-08-27 NOTE — Progress Notes (Signed)
  Subjective:    Patient ID: [name], female    DOB: 1966-09-21, 45 y.o.   MRN: [id number]  HPI Patient comes in today for followup other recent sleep study.  She was started on CPAP empirically while in the hospital, and has continued this until her recent sleep study.  She was found to have mild obstructive sleep apnea, with an AHI of 15 events per hour.  I have reviewed the study and detail with her and answered all of her questions.  She is continuing to wear her CPAP, and she feels more comfortable with the device.  She thinks she is getting at least 4 hours a night, and has no issues with mask tolerance.   Review of Systems  Constitutional: Positive for unexpected weight change. Negative for fever.  HENT: Positive for sore throat, trouble swallowing and postnasal drip. Negative for ear pain, nosebleeds, congestion, rhinorrhea, sneezing, dental problem and sinus pressure.   Eyes: Positive for redness. Negative for itching.  Respiratory: Positive for cough, chest tightness and shortness of breath. Negative for wheezing.   Cardiovascular: Positive for palpitations and leg swelling.  Gastrointestinal: Negative for nausea and vomiting.  Genitourinary: Positive for dysuria.  Musculoskeletal: Positive for joint swelling.  Skin: Positive for rash.  Neurological: Positive for headaches.  Hematological: Does not bruise/bleed easily.  Psychiatric/Behavioral: Positive for dysphoric mood. The patient is nervous/anxious.        Objective:   Physical Exam Obese female in no acute distress No skin breakdown or pressure necrosis from the CPAP mask Lower extremities with minimal edema, no cyanosis noted Alert, does not appear sleepy, moves all 4 extremities.       Assessment & Plan:

## 2011-08-27 NOTE — Patient Instructions (Signed)
Will have medical equipment company get a download off your machine in about 2 weeks.  Will then put your machine on a fixed pressure, and see what works best for you. Work on weight loss If doing well, followup with me in 6mos.

## 2011-08-27 NOTE — Assessment & Plan Note (Signed)
The patient has mild obstructive sleep apnea by her recent sleep study, however has significant nocturnal and daytime symptoms, and has underlying cardiac disease.  I have asked her to continue on her CPAP treatment, and to work on trying to use it at least 6+ hours each night.  She currently is on the auto setting, and I would like to get a download to try her on a fixed pressure setting.  She can then decide which one is more comfortable for her.  I've also asked her to work aggressively on weight loss.

## 2011-09-03 ENCOUNTER — Encounter: Payer: Self-pay | Admitting: Physician Assistant

## 2011-09-03 ENCOUNTER — Encounter: Payer: Self-pay | Admitting: *Deleted

## 2011-09-03 ENCOUNTER — Ambulatory Visit (INDEPENDENT_AMBULATORY_CARE_PROVIDER_SITE_OTHER): Payer: Self-pay | Admitting: Physician Assistant

## 2011-09-03 ENCOUNTER — Ambulatory Visit (INDEPENDENT_AMBULATORY_CARE_PROVIDER_SITE_OTHER): Payer: Self-pay | Admitting: *Deleted

## 2011-09-03 DIAGNOSIS — G4733 Obstructive sleep apnea (adult) (pediatric): Secondary | ICD-10-CM

## 2011-09-03 DIAGNOSIS — E1140 Type 2 diabetes mellitus with diabetic neuropathy, unspecified: Secondary | ICD-10-CM | POA: Insufficient documentation

## 2011-09-03 DIAGNOSIS — E119 Type 2 diabetes mellitus without complications: Secondary | ICD-10-CM

## 2011-09-03 DIAGNOSIS — I5032 Chronic diastolic (congestive) heart failure: Secondary | ICD-10-CM

## 2011-09-03 DIAGNOSIS — I509 Heart failure, unspecified: Secondary | ICD-10-CM

## 2011-09-03 DIAGNOSIS — I10 Essential (primary) hypertension: Secondary | ICD-10-CM

## 2011-09-03 DIAGNOSIS — I5033 Acute on chronic diastolic (congestive) heart failure: Secondary | ICD-10-CM

## 2011-09-03 LAB — BASIC METABOLIC PANEL
Calcium: 8.8 mg/dL (ref 8.4–10.5)
GFR: 102.71 mL/min (ref >60.00)
Glucose, Bld: 244 mg/dL — ABNORMAL HIGH (ref 70–99)
Potassium: 4.3 mEq/L (ref 3.5–5.1)
Sodium: 136 mEq/L (ref 135–145)

## 2011-09-03 MED ORDER — FUROSEMIDE 40 MG PO TABS: ORAL_TABLET | ORAL | Status: DC

## 2011-09-03 MED ORDER — FISH OIL 1000 MG PO CAPS: 2000.0000 mg | ORAL_CAPSULE | Freq: Every day | ORAL | Status: DC

## 2011-09-03 MED ORDER — AMLODIPINE BESYLATE 5 MG PO TABS: 10.0000 mg | ORAL_TABLET | Freq: Every day | ORAL | Status: DC

## 2011-09-03 MED ORDER — POTASSIUM CHLORIDE 10 MEQ PO TBCR: 20.0000 meq | EXTENDED_RELEASE_TABLET | Freq: Every day | ORAL | Status: DC

## 2011-09-03 NOTE — Assessment & Plan Note (Signed)
Probably has gastroparesis.  I have asked her to follow up with her PCP to discuss this further.

## 2011-09-03 NOTE — Progress Notes (Signed)
History of Present Illness: Primary Cardiologist:  Dr. Marca Ancona PCP: Georganna Skeans  [name] is a 45 y.o. female with a history of primarily diastolic CHF in the setting of poorly-controlled HTN.  She has been hospitalized multiple times this year.  She was admitted in 6/12 with chest pain and new LBBB and ended up getting a left heart cath, which showed no angiographic CAD.  She was admitted in 8/12 with chest pain/dyspnea and had a CT angiogram of the chest negative for PE.  Blood pressure was controlled and she was diuresed and started on CPAP.  Echo showed EF 45-50% with LV hypertrophy.    She saw Dr. Shirlee Latch 9/4 for follow up.  Her BP was still very high and she was volume overloaded with NYHA class 3 symptoms.  Amlodipine was added at 5 mg QD.  24 hr urine for metanephrines, VMA and catecholamines and renal arterial u/s are pending.  Repeat bmet demonstrated stable K and creatinine.  Lasix dose was continued but may need to be increased at follow up.  She returns for follow up today.  Labs (6/12): hgbA1c 11.1 Labs (8/12): pro-BNP 309, K 3.7, creatinine 0.86 Labs (9/12): K 3.5, creatinine 0.6, TC 205, TG 236, HDL 49, LDL 110  She still notes Class 3 symptoms.  She sleeps on 4 pillows.  No PND.  Wakes up from the CPAP and is having a hard time tolerating it.  She has chest burning with exertion.  She seems to get this when she is overloaded with fluid.  Denies eating too much salt.  Not sure about her fluid intake.  Notes problems with poor appetite.  Gets full quick.  No syncope.  Taking all meds.  Notes indicate she was to start fish oil for triglycerides.  Past Medical History: 1. Chest pain: Left heart cath in 6/12 with no angiographic CAD.  2. LBBB 3. Primarily diastolic CHF: Likely due to uncontrolled HTN.  Last echo (8/12) with EF 45-50%, mild to moderate LVH with some asymmetric septal hypertrophy, RV normal size and systolic function.  EF 50-55% by LV-gram in 6/12.  4. HTN:  Poorly controlled.  Has had HTN since age 26.  Angioedema with ACEI.  5. Diabetes: Poorly controlled.  6. OSA on CPAP   Current Outpatient Prescriptions  Medication Sig Dispense Refill  . albuterol (VENTOLIN HFA) 108 (90 BASE) MCG/ACT inhaler Inhale 2 puffs into the lungs every 6 (six) hours as needed.        [name] amLODipine (NORVASC) 5 MG tablet Take 1 tablet (5 mg total) by mouth daily.  30 tablet  11  . aspirin 325 MG tablet Take 325 mg by mouth daily.        . citalopram (CELEXA) 20 MG tablet Take 20 mg by mouth daily.        . furosemide (LASIX) 40 MG tablet Take 80 mg by mouth daily.       . insulin aspart protamine-insulin aspart (NOVOLOG 70/30) (70-30) 100 UNIT/ML injection Inject 15 Units into the skin 2 (two) times daily with a meal.        . metFORMIN (GLUMETZA) 1000 MG (MOD) 24 hr tablet Take 1,000 mg by mouth 2 (two) times daily with a meal.        . metoprolol (TOPROL-XL) 100 MG 24 hr tablet Take 100 mg by mouth 2 (two) times daily.        . pantoprazole (PROTONIX) 40 MG tablet Take 40 mg by mouth  daily.        . potassium chloride (KLOR-CON) 10 MEQ CR tablet Take 10 mEq by mouth daily.          Allergies: Allergies  Allergen Reactions  . Clindamycin     REACTION: Reaction not known  . Enalapril Maleate     REACTION: Reaction not known  . Lisinopril     REACTION: Reaction not known    SH: Unable to work and has applied for disability.  Nonsmoker.  No ETOH.   FH: Mother died from CHF in her 23s (renal failure, HTN).  Father with CHF, HTN.   Vital Signs: BP 148/100  Pulse 75  Resp 12  Ht 5\' 9"  (1.753 m)  Wt 299 lb (135.626 kg)  BMI 44.15 kg/m2  PHYSICAL EXAM: Well nourished, well developed, in no acute distress HEENT: normal Neck: JVP is up (difficult to assess - ? 6-7 cm) Cardiac:  normal S1, S2; RRR; no murmur, no gallop Lungs:  clear to auscultation bilaterally, no wheezing, rhonchi or rales Abd: soft, nontender, no hepatomegaly Ext: 1+ bilateral  edema Skin: warm and dry Neuro:  CNs 2-12 intact, no focal abnormalities noted Psych: normal affect  EKG:  NSR, HR 75, LBBB  ASSESSMENT AND PLAN:

## 2011-09-03 NOTE — Assessment & Plan Note (Signed)
Much better controlled.  Repeat by me on manual cuff left arm: 130/96.  Adjust Lasix as noted.  Will increase amlodipine to 10 mg QD.  24 hr urine and renal arterial u/s pending.

## 2011-09-03 NOTE — Assessment & Plan Note (Addendum)
Weight is up 4 lbs and she feels more swollen.  Increase Lasix to 80 mg in the AM and 40 mg in PM.  Increase K+ to 20 mEq QD and follow up bmet in one week.  I asked her to watch her fluid intake and keep it to 60 oz per day.  She already has follow up with Dr. Shirlee Latch and will keep that appointment.

## 2011-09-03 NOTE — Assessment & Plan Note (Signed)
Having trouble with her CPAP . . . Tommi Emery.  She will follow up with Dr. Shelle Iron.

## 2011-09-03 NOTE — Patient Instructions (Signed)
Your physician recommends that you schedule a follow-up appointment in: 09/28/11 @ 10 AM TO SEE [facility]. [facility]  Your physician has recommended you make the following change in your medication: INCREASE LASIX 80 AM AND 40 PM; INCREASE POTASSIUM 20 mEq DAILY; INCREASE AMLODIPINE 10 MG DAILY; START FISH OIL 2000 MG DAILY  Your physician recommends that you return for lab work in: 09/10/11 BMET 428.33 HEART FAILURE 401.9 HTN

## 2011-09-09 LAB — POCT I-STAT, CHEM 8
BUN: 7
Calcium, Ion: 0.99 — ABNORMAL LOW
Chloride: 101
Chloride: 102
Creatinine, Ser: 0.9
Creatinine, Ser: 0.9
Glucose, Bld: 370 — ABNORMAL HIGH
Potassium: 4.1
TCO2: 25

## 2011-09-09 LAB — DIFFERENTIAL
Basophils Relative: 0
Eosinophils Absolute: 0.2
Eosinophils Absolute: 0.2
Eosinophils Relative: 2
Eosinophils Relative: 2
Lymphocytes Relative: 35
Lymphs Abs: 3.2
Lymphs Abs: 3.8
Monocytes Absolute: 0.8
Monocytes Relative: 8
Monocytes Relative: 8
Neutrophils Relative %: 53

## 2011-09-09 LAB — CBC
HCT: 38.8
HCT: 39.6
Hemoglobin: 13.2
MCHC: 33.3
MCV: 82.8
MCV: 83.6
Platelets: 229
Platelets: 243
RBC: 4.68
RBC: 4.74
WBC: 10
WBC: 9.2

## 2011-09-09 LAB — URINALYSIS, ROUTINE W REFLEX MICROSCOPIC
Ketones, ur: NEGATIVE
Leukocytes, UA: NEGATIVE
Nitrite: NEGATIVE
Protein, ur: 30 — AB
Specific Gravity, Urine: 1.035 — ABNORMAL HIGH
Urobilinogen, UA: 0.2
Urobilinogen, UA: 0.2
pH: 5.5

## 2011-09-09 LAB — POCT PREGNANCY, URINE
Operator id: 277751
Preg Test, Ur: NEGATIVE

## 2011-09-09 LAB — GC/CHLAMYDIA PROBE AMP, GENITAL
Chlamydia, DNA Probe: NEGATIVE
GC Probe Amp, Genital: NEGATIVE

## 2011-09-09 LAB — URINE MICROSCOPIC-ADD ON

## 2011-09-09 LAB — WET PREP, GENITAL: Trich, Wet Prep: NONE SEEN

## 2011-09-09 LAB — URINE CULTURE

## 2011-09-10 ENCOUNTER — Ambulatory Visit (INDEPENDENT_AMBULATORY_CARE_PROVIDER_SITE_OTHER): Payer: Self-pay | Admitting: *Deleted

## 2011-09-10 DIAGNOSIS — I10 Essential (primary) hypertension: Secondary | ICD-10-CM

## 2011-09-10 LAB — BASIC METABOLIC PANEL
BUN: 8 mg/dL (ref 6–23)
CO2: 25 mEq/L (ref 19–32)
Chloride: 102 mEq/L (ref 96–112)
Creatinine, Ser: 0.9 mg/dL (ref 0.4–1.2)

## 2011-09-12 ENCOUNTER — Other Ambulatory Visit: Payer: Self-pay | Admitting: Pulmonary Disease

## 2011-09-12 DIAGNOSIS — G4733 Obstructive sleep apnea (adult) (pediatric): Secondary | ICD-10-CM

## 2011-09-13 ENCOUNTER — Encounter: Payer: Self-pay | Admitting: Cardiology

## 2011-09-13 LAB — ALDOSTERONE + RENIN ACTIVITY W/ RATIO
ALDO / PRA Ratio: 6.6 Ratio (ref 0.9–28.9)
Aldosterone: 4 ng/dL
PRA LC/MS/MS: 0.61 ng/mL/h (ref 0.25–5.82)

## 2011-09-17 ENCOUNTER — Encounter: Payer: Self-pay | Attending: Physical Medicine & Rehabilitation | Admitting: Physical Medicine & Rehabilitation

## 2011-09-17 DIAGNOSIS — R109 Unspecified abdominal pain: Secondary | ICD-10-CM | POA: Insufficient documentation

## 2011-09-17 DIAGNOSIS — M545 Low back pain, unspecified: Secondary | ICD-10-CM | POA: Insufficient documentation

## 2011-09-17 DIAGNOSIS — E1142 Type 2 diabetes mellitus with diabetic polyneuropathy: Secondary | ICD-10-CM | POA: Insufficient documentation

## 2011-09-17 DIAGNOSIS — E1149 Type 2 diabetes mellitus with other diabetic neurological complication: Secondary | ICD-10-CM | POA: Insufficient documentation

## 2011-09-17 DIAGNOSIS — M1610 Unilateral primary osteoarthritis, unspecified hip: Secondary | ICD-10-CM

## 2011-09-17 DIAGNOSIS — M47817 Spondylosis without myelopathy or radiculopathy, lumbosacral region: Secondary | ICD-10-CM

## 2011-09-17 DIAGNOSIS — M25559 Pain in unspecified hip: Secondary | ICD-10-CM | POA: Insufficient documentation
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
## 2011-09-19 ENCOUNTER — Emergency Department (HOSPITAL_COMMUNITY)
Admission: EM | Admit: 2011-09-19 | Discharge: 2011-09-19 | Disposition: A | Discharge status: Home / Self Care | Payer: Self-pay | Attending: Emergency Medicine | Admitting: Emergency Medicine

## 2011-09-19 ENCOUNTER — Emergency Department (HOSPITAL_COMMUNITY): Payer: Self-pay

## 2011-09-19 DIAGNOSIS — M25559 Pain in unspecified hip: Secondary | ICD-10-CM | POA: Insufficient documentation

## 2011-09-19 DIAGNOSIS — I509 Heart failure, unspecified: Secondary | ICD-10-CM | POA: Insufficient documentation

## 2011-09-19 DIAGNOSIS — IMO0001 Reserved for inherently not codable concepts without codable children: Secondary | ICD-10-CM | POA: Insufficient documentation

## 2011-09-19 DIAGNOSIS — I10 Essential (primary) hypertension: Secondary | ICD-10-CM | POA: Insufficient documentation

## 2011-09-19 DIAGNOSIS — M79609 Pain in unspecified limb: Secondary | ICD-10-CM | POA: Insufficient documentation

## 2011-09-19 DIAGNOSIS — E119 Type 2 diabetes mellitus without complications: Secondary | ICD-10-CM | POA: Insufficient documentation

## 2011-09-19 DIAGNOSIS — Z79899 Other long term (current) drug therapy: Secondary | ICD-10-CM | POA: Insufficient documentation

## 2011-09-20 LAB — URINALYSIS, ROUTINE W REFLEX MICROSCOPIC
Hgb urine dipstick: NEGATIVE
Protein, ur: NEGATIVE
Urobilinogen, UA: 0.2

## 2011-09-20 LAB — CK TOTAL AND CKMB (NOT AT ARMC)
CK, MB: 2.2
Relative Index: 1.7

## 2011-09-20 LAB — COMPREHENSIVE METABOLIC PANEL
ALT: 28
AST: 21
Albumin: 4.1
Alkaline Phosphatase: 84
Chloride: 94 — ABNORMAL LOW
GFR calc Af Amer: >60
Potassium: 4.2
Sodium: 133 — ABNORMAL LOW
Total Protein: 7.6

## 2011-09-20 LAB — I-STAT 8, (EC8 V) (CONVERTED LAB)
BUN: 4 — ABNORMAL LOW
Bicarbonate: 29.4 — ABNORMAL HIGH
Glucose, Bld: 221 — ABNORMAL HIGH
Operator id: 285491
Potassium: 4.1
Sodium: 133 — ABNORMAL LOW
TCO2: 31
pCO2, Ven: 46.9

## 2011-09-20 LAB — CBC
HCT: 36.4
MCHC: 32.9
MCHC: 33.2
MCV: 84.2
MCV: 85
MCV: 85.3
Platelets: 243
Platelets: 278
Platelets: 280
RDW: 12.9
RDW: 13.1

## 2011-09-20 LAB — RAPID URINE DRUG SCREEN, HOSP PERFORMED
Barbiturates: NOT DETECTED
Benzodiazepines: NOT DETECTED

## 2011-09-20 LAB — BASIC METABOLIC PANEL
BUN: 3 — ABNORMAL LOW
BUN: 8
CO2: 32
Chloride: 98
Creatinine, Ser: 1.04
Creatinine, Ser: 1.08
GFR calc non Af Amer: 58 — ABNORMAL LOW
Glucose, Bld: 178 — ABNORMAL HIGH
Potassium: 3.8

## 2011-09-20 LAB — DIFFERENTIAL
Basophils Relative: 1
Eosinophils Absolute: 0.1 — ABNORMAL LOW
Neutrophils Relative %: 59

## 2011-09-20 LAB — POCT I-STAT CREATININE: Creatinine, Ser: 0.9

## 2011-09-20 LAB — URINE MICROSCOPIC-ADD ON

## 2011-09-20 LAB — MRSA CULTURE

## 2011-09-20 LAB — ANA: Anti Nuclear Antibody(ANA): NEGATIVE

## 2011-09-20 LAB — TROPONIN I: Troponin I: 0.01

## 2011-09-20 LAB — URINE CULTURE

## 2011-09-20 LAB — C4 COMPLEMENT: Complement C4, Body Fluid: 35

## 2011-09-20 LAB — HEMOGLOBIN A1C: Mean Plasma Glucose: 236

## 2011-09-20 LAB — APTT: aPTT: 27

## 2011-09-22 LAB — URINALYSIS, ROUTINE W REFLEX MICROSCOPIC
Glucose, UA: NEGATIVE
Nitrite: NEGATIVE
Specific Gravity, Urine: 1.022
pH: 6

## 2011-09-22 LAB — URINE MICROSCOPIC-ADD ON

## 2011-09-28 ENCOUNTER — Ambulatory Visit: Payer: Self-pay | Admitting: Cardiology

## 2011-10-01 ENCOUNTER — Encounter: Payer: Self-pay | Admitting: Internal Medicine

## 2011-10-01 ENCOUNTER — Inpatient Hospital Stay (HOSPITAL_COMMUNITY)
Admission: EM | Admit: 2011-10-01 | Discharge: 2011-10-02 | DRG: 293 | Disposition: A | Discharge status: Home / Self Care | Payer: Medicaid Other | Attending: Internal Medicine | Admitting: Internal Medicine

## 2011-10-01 ENCOUNTER — Emergency Department (HOSPITAL_COMMUNITY): Payer: Medicaid Other

## 2011-10-01 DIAGNOSIS — I5043 Acute on chronic combined systolic (congestive) and diastolic (congestive) heart failure: Principal | ICD-10-CM | POA: Diagnosis present

## 2011-10-01 DIAGNOSIS — E119 Type 2 diabetes mellitus without complications: Secondary | ICD-10-CM | POA: Diagnosis present

## 2011-10-01 DIAGNOSIS — F3289 Other specified depressive episodes: Secondary | ICD-10-CM | POA: Diagnosis present

## 2011-10-01 DIAGNOSIS — E785 Hyperlipidemia, unspecified: Secondary | ICD-10-CM | POA: Diagnosis present

## 2011-10-01 DIAGNOSIS — I509 Heart failure, unspecified: Secondary | ICD-10-CM | POA: Diagnosis present

## 2011-10-01 DIAGNOSIS — G4733 Obstructive sleep apnea (adult) (pediatric): Secondary | ICD-10-CM | POA: Diagnosis present

## 2011-10-01 DIAGNOSIS — M1990 Unspecified osteoarthritis, unspecified site: Secondary | ICD-10-CM | POA: Diagnosis present

## 2011-10-01 DIAGNOSIS — I10 Essential (primary) hypertension: Secondary | ICD-10-CM | POA: Diagnosis present

## 2011-10-01 DIAGNOSIS — E876 Hypokalemia: Secondary | ICD-10-CM | POA: Diagnosis present

## 2011-10-01 DIAGNOSIS — F329 Major depressive disorder, single episode, unspecified: Secondary | ICD-10-CM | POA: Diagnosis present

## 2011-10-01 LAB — GLUCOSE, CAPILLARY
Glucose-Capillary: 297 mg/dL — ABNORMAL HIGH (ref 70–99)
Glucose-Capillary: 219 mg/dL — ABNORMAL HIGH (ref 70–99)

## 2011-10-01 LAB — COMPREHENSIVE METABOLIC PANEL
ALT: 13 U/L (ref 0–35)
AST: 13 U/L (ref 0–37)
CO2: 28 mEq/L (ref 19–32)
Calcium: 9.6 mg/dL (ref 8.4–10.5)
Sodium: 136 mEq/L (ref 135–145)
Total Protein: 7.2 g/dL (ref 6.0–8.3)

## 2011-10-01 LAB — DIFFERENTIAL
Basophils Relative: 0 % (ref 0–1)
Eosinophils Absolute: 0.1 10*3/uL (ref 0.0–0.7)
Eosinophils Relative: 1 % (ref 0–5)
Monocytes Absolute: 0.9 10*3/uL (ref 0.1–1.0)
Monocytes Relative: 8 % (ref 3–12)
Neutro Abs: 6.6 10*3/uL (ref 1.7–7.7)

## 2011-10-01 LAB — POCT I-STAT, CHEM 8
BUN: 14 mg/dL (ref 6–23)
Calcium, Ion: 1.17 mmol/L (ref 1.12–1.32)
Creatinine, Ser: 0.8 mg/dL (ref 0.50–1.10)
Glucose, Bld: 219 mg/dL — ABNORMAL HIGH (ref 70–99)
TCO2: 23 mmol/L (ref 0–100)

## 2011-10-01 LAB — CK TOTAL AND CKMB (NOT AT ARMC): CK, MB: 2.7 ng/mL (ref 0.3–4.0)

## 2011-10-01 LAB — LIPID PANEL
HDL: 41 mg/dL (ref >39)
LDL Cholesterol: 117 mg/dL — ABNORMAL HIGH (ref 0–99)
Total CHOL/HDL Ratio: 5.2 RATIO

## 2011-10-01 LAB — CBC
Hemoglobin: 12.6 g/dL (ref 12.0–15.0)
MCH: 27.9 pg (ref 26.0–34.0)
MCHC: 33.3 g/dL (ref 30.0–36.0)
RDW: 12.9 % (ref 11.5–15.5)

## 2011-10-01 LAB — HEMOGLOBIN A1C
Hgb A1c MFr Bld: 10.1 % — ABNORMAL HIGH (ref <5.7)
Mean Plasma Glucose: 243 mg/dL — ABNORMAL HIGH (ref <117)

## 2011-10-01 LAB — TROPONIN I: Troponin I: <0.3 ng/mL (ref <0.30)

## 2011-10-01 LAB — POCT I-STAT TROPONIN I: Troponin i, poc: 0 ng/mL (ref 0.00–0.08)

## 2011-10-01 NOTE — H&P (Signed)
Hospital Admission Note Date: 10/01/2011  Patient name:  [name]   Medical record number:  [id number] Date of birth:  09/04/1966  Age: 45 y.o. Gender:  female PCP:    Georganna Skeans, MD, MD  Medical Service:   Internal Medicine Teaching Service   Attending physician:  Dr. Tilford Pillar First Contact:   Dr. Candy Sledge    Pager: [phone]  Second Contact:   Dr. Dorthula Rue   Pager: [phone] After Hours:    First Contact   Pager: [phone]      Second Contact  Pager: [phone]   Chief Complaint: shortness of breath  History of Present Illness: Patient is a 45 y.o. female with a PMHx of DM, NICM, HTN, HLD, OSA on CPAP comes to the Ed c/o shortness of breath and chest pain for past 1 week. She was perfectly fine until past week when she ran out of all her medications and could not refill it for 304 days due to financial difficulties. She gradually started feeling short of breath with exertion and with lying flat. The symptoms continues to get worse and she also felt left sided sharp chest pain this week which radiated to her arm and back. She di get her medications after the 4 days of stopping it and has been taking them religiously but has not felt better. She takes 2 40 mg lasix tabs a day and has not changed the dose lately. She weighed herself last week and she had gained 5 lbs. She did not notice any increase swelling in her legs which is unusual for her CHF exacerbation episodes but everything else feels typical for her CHF exacerbation. She is also having cough mostly on lying flat with whitish productive sputum. She denies fever or chills. She denies increased salt intake. She has OSA but uses CPAP only occasionally because of discomfort. She has been in increase emotional stress lately due to family and financial issues. She was in the hospital in 8/12 for similar symptoms and had few other epodes like this after that but took care of it at home with increase lasix dosage. She received 2 Aspirin  325 mg, lasix 40 iv, NTG s/l tab and albuterol inhaler in ED and has been feeling fine since.   Current Outpatient Medications: Current Outpatient Prescriptions  Medication Sig Dispense Refill  . albuterol (VENTOLIN HFA) 108 (90 BASE) MCG/ACT inhaler Inhale 2 puffs into the lungs every 6 (six) hours as needed.        [name] amLODipine (NORVASC) 5 MG tablet Take 2 tablets (10 mg total) by mouth daily.  60 tablet  11  . aspirin 325 MG tablet Take 325 mg by mouth daily.        . citalopram (CELEXA) 20 MG tablet Take 20 mg by mouth daily.        . furosemide (LASIX) 40 MG tablet TAKE  40 MG IN THE AM AND TAKE 40 IN THE PM  60 tablet  11  . insulin aspart protamine-insulin aspart (NOVOLOG 70/30) (70-30) 100 UNIT/ML injection Inject 15 Units into the skin 2 (two) times daily with a meal.        . metFORMIN (GLUMETZA) 1000 MG (MOD) 24 hr tablet Take 1,000 mg by mouth 2 (two) times daily with a meal.        . metoprolol (TOPROL-XL) 100 MG 24 hr tablet Take 100 mg by mouth 2 (two) times daily.        . Omega-3 Fatty Acids (FISH OIL) 1000  MG CAPS Take 2 capsules (2,000 mg total) by mouth daily.  60 capsule  3  . pantoprazole (PROTONIX) 40 MG tablet Take 40 mg by mouth daily.        - Imdur  30 mg po qd    . potassium chloride (KLOR-CON) 10 MEQ CR tablet Take 2 tablets (20 mEq total) by mouth daily.  60 tablet  11    Allergies: Allergies  Allergen Reactions  . Clindamycin     REACTION: face and limb swelling  . Enalapril Maleate     REACTION: face and limb swelling  . Lisinopril     REACTION: face and limb swelling     Past Medical History: Past Medical History  Diagnosis Date  . Hypertension   . Diabetes mellitus, type 2     HbA1C  11.1 8/12  . NICM (nonischemic cardiomyopathy)     EF 45-50% in 8/12, cath 6/12 showed normal coronaries  . Morbid obesity   . Allergic rhinitis   . HLD (hyperlipidemia)     LDL 110.9 in 9/12  . OSA (obstructive sleep apnea)     sleep study in 8/12 showed  moderate to severe OSA requiring CPAP  . Chronic lower back pain     secondary to DJD, obseity, hip problems. Sees Dr. Ivory Broad  . 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 CHF (congestive heart failure)     multiple admission, last one in 8/12. Discharge weight  135 kg    Past Surgical History: Past Surgical History  Procedure Date  . Breast biopsies   . Tubal ligation 05/31/1985  . Hand surgery     Left  . Shoulder surgery     right  . Dental surgery     tumor removed     C-section    Family History: Family History  Problem Relation Age of Onset  . Heart disease (died of ACS) Mother 64  . Heart disease Paternal Grandmother   . Heart disease Paternal Grandfather 36  . Heart disease (multiple stent) Father 51  . Stroke Paternal Grandfather    DM, ESRD,breast cancer Grand mother     Social History:  Live alone. Her son moved out recently. No income. Borrows money for food and medications. 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 No primary insurance. Has applied for disability but refused. Denies smoking, alcohol or drug use.   Review of Systems: Constitutional:  Denies fever, chills, diaphoresis, appetite change and fatigue.  HEENT: Denies photophobia, eye pain, redness, hearing loss, ear pain, congestion, sore throat, rhinorrhea, sneezing, mouth sores, trouble swallowing, neck pain, neck stiffness and tinnitus.  Respiratory: See HPI  Cardiovascular: See HPI  Gastrointestinal: Denies nausea, vomiting, abdominal pain, diarrhea, constipation, blood in stool and abdominal distention.  Genitourinary: Denies dysuria, urgency, frequency, hematuria, flank pain and difficulty urinating.  Musculoskeletal: Denies myalgias, back pain, joint swelling, arthralgias and gait problem.   Skin: Denies pallor, rash and wound.  Neurological: Denies dizziness, seizures, syncope, weakness, light-headedness, numbness and headaches.   Hematological: Denies adenopathy.  Easy bruising, personal or family bleeding history.  Psychiatric/ Behavioral: Denies suicidal ideation, mood changes, confusion, nervousness, sleep disturbance and agitation.    Vital Signs: T: 97.7 P: 115 BP: 148/97 RR: 24 O2 sat: 95% 2L   Physical Exam: General: Vital signs reviewed and noted. Well-developed, obese, in no acute distress; alert, appropriate and cooperative throughout examination.  Head: Normocephalic, atraumatic.  Eyes: PERRL, EOMI, No  signs of anemia or jaundince.  Nose: Mucous membranes moist, not inflammed, nonerythematous.  Throat: Oropharynx nonerythematous, no exudate appreciated.   Neck: No deformities, masses, or tenderness noted.Supple, No carotid Bruits, no JVD.  Lungs:  Normal respiratory effort.BL basilar crackles, no wheezes.  Heart: RRR. S1 and S2 normal without gallop, murmur, or rubs.  Abdomen:  BS normoactive. Soft, Nondistended, non-tender.  No masses or organomegaly.  Extremities: 1+ pre tebial edema b/l.  Neurologic: A&O X3, CN II - XII are grossly intact. Motor strength is 5/5 in the all 4 extremities, Sensations intact to light touch, Cerebellar signs negative.  Skin: No visible rashes, scars.   Lab results: Basic Metabolic Panel: Recent Labs  [facility] 10/01/11 0457   NA 136   K 3.5   CL 101   CO2 --   GLUCOSE 219*   BUN 14   CREATININE 0.80   CALCIUM --   MG --   PHOS --   Liver Function Tests: No results found for this basename: AST:2,ALT:2,ALKPHOS:2,BILITOT:2,PROT:2,ALBUMIN:2 in the last 72 hours No results found for this basename: LIPASE:2,AMYLASE:2 in the last 72 hours CBC: Recent Labs  Basename 10/01/11 0457 10/01/11 0447   WBC -- 11.3*   NEUTROABS -- 6.6   HGB 13.6 12.6   HCT 40.0 37.8   MCV -- 83.6   PLT -- 269   Cardiac Enzymes: Recent Labs  Basename 10/01/11 0505   CKTOTAL 102   CKMB 2.7   CKMBINDEX --   TROPONINI --   BNP: Recent Labs  [facility] 10/01/11 0447   POCBNP 592.9* <--  309 (8/12)    Imaging  results:  Dg Chest 2 View  10/01/2011  *RADIOLOGY REPORT*  Clinical Data: Shortness of breath and weakness  CHEST - 2 VIEW  Comparison: 08/10/2011  Findings: Increased heart size and pulmonary vascularity. Developing interstitial changes in the lung bases suggesting interstitial edema.  No blunting of costophrenic angles.  No focal airspace consolidation.  No pneumothorax.  IMPRESSION: Congestive changes in the heart and lungs with developing interstitial edema since previous study.  Original Report Authenticated By: Marlon Pel, M.D.     Other results: EKG: no new EKG changes    Assessment & Plan: 45 y/o w with NICM and EF of 45%, DM, HTN, HLD, OSA, morbid obesity and very strong family h/o heart disease comes in with CHF exacerbation in setting of medication non compliance. Last episode was in 8/12 requiring admission and several small episodes since then which she took care of by herself at home.   1. CHF exacerbation: In setting of medication non compliance. Worsening pulmonary HTN from OSA may also be a possibility given she does not use her CPAP. ACS in also likely and will need to be ruled out.  Will admit to telemetry to monitor for arrythmia Will cycle cardiac enzymes Will restrict fluids and dietary salt and check daily weight (discharge wt in 8/12 was 135 kg) and I/Os Will diurese with lasix 80 iv bid and closely monitor serum creatinine with daily BMET Will not need an echo at this time as had one done recently in 8/12 Will continue BB, asp, imdur for secondary prevention She may benefit from hydralazine as hydralazine and nitrate have mortality benefit in [place] americans with CHF Cannot give ACei in setting of h/o facial swelling and possibly anaphylaxis.   2. DM: check HbA1C. Cover with correction insulin in hospital. May restart home regimen once clinically stable.  3. HTN- continue metoprolol, imdur in hospital.  May add hydralazine po if needed as mentioned in #1 4.  HLD- last LDL was 110, for her goal would be <70. Will recheck FLP and add statin as needed.  5. OSA- CPAP at night per RT 6. Morbid obesity- counseling regarding diet and exercise in the hospital. 7. GERD- ppi 8. DJD of back and knees- tyelnol #3, avoid NSAIDs in setting of CHF and cardiomyopathy 9. Depression/ anxiety- continue celexa. Prn xanax.  10. Social issues- financial difficulties posing as barrier in optimal management of CHF. Will ask SW to assist with disability eligibility and med assistance.  11. DVT Px- lovenox     Bethel Born, M.D. (PGY3):  ____________________________________    Date/ Time:     ____________________________________        I have seen and examined the patient. I reviewed the resident/fellow note and agree with the findings and plan of care as documented. My additions and revisions are included.   Signature:  ____________________________________________     Internal Medicine Teaching Service Attending    Date:    ____________________________________________

## 2011-10-02 DIAGNOSIS — I509 Heart failure, unspecified: Secondary | ICD-10-CM

## 2011-10-02 LAB — BASIC METABOLIC PANEL
Calcium: 9.5 mg/dL (ref 8.4–10.5)
GFR calc non Af Amer: 66 mL/min — ABNORMAL LOW (ref >90)
Glucose, Bld: 317 mg/dL — ABNORMAL HIGH (ref 70–99)
Sodium: 134 mEq/L — ABNORMAL LOW (ref 135–145)

## 2011-10-02 LAB — GLUCOSE, CAPILLARY
Glucose-Capillary: 289 mg/dL — ABNORMAL HIGH (ref 70–99)
Glucose-Capillary: 209 mg/dL — ABNORMAL HIGH (ref 70–99)

## 2011-10-02 LAB — CBC
Hemoglobin: 12.1 g/dL (ref 12.0–15.0)
MCH: 27.6 pg (ref 26.0–34.0)
RBC: 4.39 MIL/uL (ref 3.87–5.11)

## 2011-10-02 LAB — MAGNESIUM: Magnesium: 1.6 mg/dL (ref 1.5–2.5)

## 2011-10-04 NOTE — Discharge Summary (Signed)
[name], [name] NO.:  [id number]  MEDICAL RECORD NO.:  [id number]  LOCATION:  4732                         FACILITY:  [facility]  PHYSICIAN:  Doneen Poisson, MD     DATE OF BIRTH:  January 05, 1966  DATE OF ADMISSION:  10/01/2011 DATE OF DISCHARGE:  10/02/2011                              DISCHARGE SUMMARY   DISCHARGE DIAGNOSES: 1. Acute congestive heart failure exacerbation secondary to not having     access to medications. 2. Diabetes mellitus. 3. Obstructive sleep apnea. 4. Hypertension. 5. Osteoarthritis. 6. Abdominal pain. 7. Depression. 8. Morbid obesity. 9. Hyperlipidemia.  DISCHARGE MEDICATIONS:  The patient was discharged on the following medications: 1. Aspirin 81 mg by mouth daily. 2. Citalopram 20 mg 1 tablet by mouth every morning. 3. Furosemide 40 mg 1 tablet by mouth daily. 4. Insulin NovoLog 70/30, 25 units subcutaneously daily. 5. Isosorbide mononitrate 30 mg 1 tablet by mouth daily. 6. Metformin 1000 mg 1 tablet by mouth twice daily. 7. Metoprolol tartrate 1 tablet by mouth twice daily 100 mg tablets. 8. Potassium chloride 10 mEq tablets, 1 tablet by mouth daily. 9. Albuterol inhaler 2 puffs inhaled every 4 hours as needed for     shortness of breath.  The patient is to stop taking the following medications: 1. Aspirin 325 mg 1 tablet by mouth daily. 2. Pantoprazole 40 mg 1 tablet by mouth every morning.  DISPOSITION AND FOLLOWUP:  Ms. Behrendt was discharged from [facility] on October 02, 2011, in stable and improved condition.  She had no shortness of breath, no wheezing, minimal edema, and minimal chest discomfort.  She states she felt much better and was ready to go home. She needs to call and make an appointment with her primary care physician, Dr. Georganna Skeans and at that time the following items should be addressed: 1. Access to medications is the primary concern and the reason for     admission today.  She needs to be  supported in obtaining orange     card and currently she has appointment for this on October 20, 2011. 2. Please check chem-7 to evaluate for hypokalemia as the patient was     mildly hypokalemic during her hospitalization. 3. Please also follow up on overall fluid balance. 4. Please follow up on blood pressure.  The patient was provided 1 month supply of medications by the Medical Team for the following 5 medications;  furosemide, metformin, metoprolol, potassium, and isosorbide mononitrate.  The goal was that this would control her CHF, diabetes, and hypertension well enough for her to be able to attend the meeting on October 20, 2011, and get reconnected with the orange card program.  This service was very generously provided by her attending physician, Dr. Doneen Poisson.  PROCEDURES PERFORMED:  Two-view chest radiograph dated on September 29, 2011,  showed congestive changes of the heart and lungs with developing interstitial edema.  CONSULTATIONS:  None.  ADMITTING HISTORY AND PHYSICAL: History of Present Illness:  Patient is a 45 y.o. female with a PMHx of DM, NICM, HTN, HLD, OSA on CPAP comes to the Ed c/o shortness of breath and chest pain for  past 1 week. She was perfectly fine until past week when she ran out of all her medications and could not refill it for 304 days due to financial difficulties. She gradually started feeling short of breath with exertion and with lying flat. The symptoms continues to get worse and she also felt left sided sharp chest pain this week which radiated to her arm and back. She di get her medications after the 4 days of stopping it and has been taking them religiously but has not felt better. She takes 2 40 mg lasix tabs a day and has not changed the dose lately. She weighed herself last week and she had gained 5 lbs. She did not notice any increase swelling in her legs which is unusual for her CHF exacerbation episodes but everything else feels  typical for her CHF exacerbation. She is also having cough mostly on lying flat with whitish productive sputum. She denies fever or chills. She denies increased salt intake. She has OSA but uses CPAP only occasionally because of discomfort. She has been in increase emotional stress lately due to family and financial issues. She was in the hospital in 8/12 for similar symptoms and had few other epodes like this after that but took care of it at home with increase lasix dosage. She received 2 Aspirin 325 mg, lasix 40 iv, NTG s/l tab and albuterol inhaler in ED and has been feeling fine since.  Vital Signs:  T:  97.7  P:  115  BP:  148/97  RR:  24  O2 sat:  95% 2L   Physical Exam:  General:  Vital signs reviewed and noted. Well-developed, obese, in no acute distress; alert, appropriate and cooperative throughout examination.   Head:  Normocephalic, atraumatic.   Eyes:  PERRL, EOMI, No signs of anemia or jaundince.   Nose:  Mucous membranes moist, not inflammed, nonerythematous.   Throat:  Oropharynx nonerythematous, no exudate appreciated.   Neck:  No deformities, masses, or tenderness noted.Supple, No carotid Bruits, no JVD.   Lungs:  Normal respiratory effort.BL basilar crackles, no wheezes.   Heart:  RRR. S1 and S2 normal without gallop, murmur, or rubs.   Abdomen:  BS normoactive. Soft, Nondistended, non-tender. No masses or organomegaly.   Extremities:  1+ pre tebial edema b/l.   Neurologic:  A&O X3, CN II - XII are grossly intact. Motor strength is 5/5 in the all 4 extremities, Sensations intact to light touch, Cerebellar signs negative.   Skin:  No visible rashes, scars.   Lab results:  Basic Metabolic Panel:  Recent Labs   [facility]  10/01/11 0457    NA  136    K  3.5    CL  101    CO2  --    GLUCOSE  219*    BUN  14    CREATININE  0.80    CBC:  Recent Labs   Basename  10/01/11 0457  10/01/11 0447    WBC  --  11.3*    NEUTROABS  --  6.6    HGB  13.6  12.6    HCT  40.0  37.8     MCV  --  83.6    PLT  --  269   Cardiac Enzymes:  Recent Labs   Basename  10/01/11 0505    CKTOTAL  102    CKMB  2.7    CKMBINDEX  --    TROPONINI  --   BNP:  Recent Labs  Basename  10/01/11 0447    POCBNP  592.9* <-- 309 (8/12)       HOSPITAL COURSE BY PROBLEM: 1. CHF exacerbation.  Ms. Vorhees was aggressively diuresed with IV     Lasix.  She was also maintained on metoprolol, aspirin, and     isosorbide mononitrate for blood pressure and secondary prevention.     Ms. Turbyfill had very rapid diuresis.  Total output was over 5 L.     She was discharged at her presumed dry weight, which is     approximately 130 kg.  With this diuresis, came rapid decrease in     chest pain and elimination of shortness of breath.  The patient was     given 1 month's supply of furosemide, metoprolol, isosorbide     mononitrate, and potassium.  2. Diabetes mellitus.  Ms. Maurer was maintained on sliding scale     insulin with 5 units of meal coverage during her hospitalization.     Her blood sugars were elevated during this admission, however, they     were consistent with her mean plasma glucose in the 200s.  She was     discharged on her home regimen, however, it is unlikely she will be     able to afford insulin without the orange card.  She was given 1      month's supply of metformin.  3. Hypertension.  Ms. Peeks hypertension was well controlled during     her hospitalization by maintaining her on beta-blocker as well as     rapid IV diuresis.  4. Obstructive sleep apnea.  Ms. Padmanabhan was provided with CPAP      during her hospitalization.  5. Osteoarthritis.  Pain controlled with Tylenol.  6. Abdominal pain.  Ms. Stepien has a diagnosis of gastroesophageal     reflux disease, however, her symptoms are more consistent with     stress related abdominal pain.  Therefore, PPI therapy is not     necessary.  Furthermore given her financial limitations,     pantoprazole is a very costly  drug which she will likely not be     able to afford anyway.  It will be better if she spent her     medication on critical blood pressure and diuretic medications.  7. Depression.  Overall stable, but her lingering depression is likely a      Prime factor in her lack of motivation to keep her orange card current.      She was maintained on home SSRI.  8. Morbid obesity.  Ms. Manka was given counseling regarding diet,     physical fitness, and exercise.  9. Hyperlipidemia.  While Ms. Racey does have elevated cholesterol     with an LDL of 117, her heart failure is not thought to be     ischemic.  As such, statin is not absolutely required at this     Juncture.  Given the excess financial burden this medication will     place upon her, she was not started on statin.  DISCHARGE VITAL SIGNS:  Temperature 97.6, pulse 79, blood pressure 100/62, respirations 20, O2 saturation 100% on room air.  DISCHARGE LABORATORY DATA:  CBC:  White blood cell count 81, hemoglobin 12.1, hematocrit 37.5, platelets 257.  Magnesium 1.6, sodium 134, potassium 3.7, chloride 96, bicarb 26, BUN 13, creatinine 1.01, calcium 9.5, glucose 317.  Cardiac markers were negative x3.  Hemoglobin A1c was 10.1.  LDL cholesterol was  117, total cholesterol was 212, HDL was 41.    ______________________________ Quentin Ore, MD   ______________________________ Doneen Poisson, MD    MR/MEDQ  D:  10/02/2011  T:  10/02/2011  Job:  [id number]  cc:   Georganna Skeans, MD

## 2011-10-20 ENCOUNTER — Encounter: Payer: Self-pay | Attending: Physical Medicine & Rehabilitation | Admitting: Physical Medicine & Rehabilitation

## 2011-10-20 DIAGNOSIS — M545 Low back pain, unspecified: Secondary | ICD-10-CM | POA: Insufficient documentation

## 2011-10-20 DIAGNOSIS — E1142 Type 2 diabetes mellitus with diabetic polyneuropathy: Secondary | ICD-10-CM

## 2011-10-20 DIAGNOSIS — M25559 Pain in unspecified hip: Secondary | ICD-10-CM | POA: Insufficient documentation

## 2011-10-20 DIAGNOSIS — E1149 Type 2 diabetes mellitus with other diabetic neurological complication: Secondary | ICD-10-CM | POA: Insufficient documentation

## 2011-10-20 DIAGNOSIS — M67919 Unspecified disorder of synovium and tendon, unspecified shoulder: Secondary | ICD-10-CM | POA: Insufficient documentation

## 2011-10-20 DIAGNOSIS — M7530 Calcific tendinitis of unspecified shoulder: Secondary | ICD-10-CM

## 2011-10-20 DIAGNOSIS — M719 Bursopathy, unspecified: Secondary | ICD-10-CM | POA: Insufficient documentation

## 2011-10-20 DIAGNOSIS — IMO0002 Reserved for concepts with insufficient information to code with codable children: Secondary | ICD-10-CM

## 2011-10-20 DIAGNOSIS — G8929 Other chronic pain: Secondary | ICD-10-CM | POA: Insufficient documentation

## 2011-10-20 DIAGNOSIS — M5137 Other intervertebral disc degeneration, lumbosacral region: Secondary | ICD-10-CM

## 2011-10-20 DIAGNOSIS — I428 Other cardiomyopathies: Secondary | ICD-10-CM | POA: Insufficient documentation

## 2011-10-20 NOTE — Assessment & Plan Note (Signed)
[name] is back regarding her back and right hip pain.  Apparently she had a fall a few days after I saw her at last visit and was in the ED. X-rays showed no fracture nor no significant arthritis in the hip. Continues to have lot of pain with weightbearing as well as sitting and bending.  Pain is in the right low back radiating down the legs and into the medial thigh area as well.  She has a lumbar spine x-ray from earlier this year, which showed degenerative disk disease at multiple levels.  She has not had an MRI.  She received some Robaxin and hydrocodone from the ED physician and this was helpful for her pain. Unfortunately, she was admitted recently for congestive heart failure and her Lasix was increased to 80 mg b.i.d.  Dr. Shirlee Latch is her cardiologist.  REVIEW OF SYSTEMS:  Notable for the above.  She does have some dizziness, anxiety, depression, high sugars, although they are improving.  Weight is still as a problem.  She has sleep apnea.  Full 12- point review is in the written health and history section of the chart.  SOCIAL HISTORY:  The patient is single.  She has lot of stress on her regarding her disability and multiple medical issues.  PHYSICAL EXAMINATION:  VITAL SIGNS:  Blood pressure is 148/85, pulse is 89, respiratory rate 16, she is saturating 98% on room air. GENERAL:  The patient is pleasant, alert.  She remains morbidly obese. EXTREMITIES:  She has diminished sensation in distal limbs all 4.  She has pain with palpation over the right hip into the right low back. Pain is worse with bending and she can only accomplish lumbar flexion to about 60 degrees.  There is some antalgia with gait on the right and pain inhibition with proximal muscle use, particularly with hip flexion and knee extension today.  She had pain in both shoulders with rotator cuff impingement maneuvers.  Bicipital tendons are mildly tender. HEART:  Regular. CHEST:  Clear. ABDOMEN:  Soft,  nontender.  The patient was using a cane to walk today and did not seem overly short winded with movement today.  ASSESSMENT: 1. Chronic low back pain, which is multifactorial.  The patient likely     with degenerative disk disease and associated radiculopathy perhaps     in the L3-L4 regions. 2. Persistent right hip pain.  Cannot rule out a process such as     avascular necrosis, but no obvious arthritis on x-rays. 3. Diabetic peripheral neuropathy. 4. Morbid obesity. 5. Cardiomyopathy. 6. Bilateral rotator cuff syndrome, right greater left, likely out of     over compensation for her back and hip pain.  PLAN: 1. We are limited given her medical history as far as pharmaceutical     and interventional options are concerned.  I would like to send her     to physical therapy to work on rotator cuff range of motion,     strengthening, and scapular stabilizer exercises. 2. We will introduce hydrocodone 5/325 for breakthrough pain 1 q.6 h.     p.r.n. #75. 3. Add Robaxin 500 mg q.6 h. p.r.n. for spasm, #60. 4. We will send her for an MRI of her lumbar spine to discern the     extent of her lumbar disease.  Could consider epidural steroid     injection. 5. The patient needs to work on better health hygiene overall. 6. Reassured her that some of the tingling in her feet  and hands is     from her diabetes and that with better control     this could be stabilized. 7. I will see her back here in about 4-6 weeks pending MRI results.     Ranelle Oyster, M.D. Electronically Signed    ZTS/MedQ D:  10/20/2011 11:32:29  T:  10/20/2011 11:48:34  Job #:  [id number]  cc:   Marca Ancona, MD [address] [place] [address]  Georganna Skeans, MD Fax: [phone]

## 2011-10-27 ENCOUNTER — Ambulatory Visit: Payer: Self-pay | Admitting: Cardiology

## 2011-10-27 ENCOUNTER — Other Ambulatory Visit: Payer: Self-pay | Admitting: Physical Medicine & Rehabilitation

## 2011-10-27 DIAGNOSIS — M5410 Radiculopathy, site unspecified: Secondary | ICD-10-CM

## 2011-10-29 ENCOUNTER — Ambulatory Visit (HOSPITAL_COMMUNITY)
Admission: RE | Admit: 2011-10-29 | Discharge: 2011-10-29 | Disposition: A | Discharge status: Home / Self Care | Payer: Self-pay | Source: Ambulatory Visit | Attending: Physical Medicine & Rehabilitation | Admitting: Physical Medicine & Rehabilitation

## 2011-10-29 DIAGNOSIS — M5137 Other intervertebral disc degeneration, lumbosacral region: Secondary | ICD-10-CM | POA: Insufficient documentation

## 2011-10-29 DIAGNOSIS — M51379 Other intervertebral disc degeneration, lumbosacral region without mention of lumbar back pain or lower extremity pain: Secondary | ICD-10-CM | POA: Insufficient documentation

## 2011-10-29 DIAGNOSIS — M5126 Other intervertebral disc displacement, lumbar region: Secondary | ICD-10-CM | POA: Insufficient documentation

## 2011-10-29 DIAGNOSIS — M79609 Pain in unspecified limb: Secondary | ICD-10-CM | POA: Insufficient documentation

## 2011-10-29 DIAGNOSIS — G8929 Other chronic pain: Secondary | ICD-10-CM | POA: Insufficient documentation

## 2011-10-29 DIAGNOSIS — M5410 Radiculopathy, site unspecified: Secondary | ICD-10-CM

## 2011-10-29 DIAGNOSIS — M545 Low back pain, unspecified: Secondary | ICD-10-CM | POA: Insufficient documentation

## 2011-10-29 DIAGNOSIS — M48061 Spinal stenosis, lumbar region without neurogenic claudication: Secondary | ICD-10-CM | POA: Insufficient documentation

## 2011-11-01 ENCOUNTER — Telehealth: Payer: Self-pay | Admitting: Pulmonary Disease

## 2011-11-01 ENCOUNTER — Encounter: Payer: Self-pay | Admitting: Cardiology

## 2011-11-01 NOTE — Telephone Encounter (Signed)
[email or web address]  

## 2011-11-02 NOTE — Telephone Encounter (Signed)
The patient is trying to get assistance with her bills through the [employer] and needs a letter from [facility] stating that she does use CPAP and that she needs to use this nightly. KC will you do this for the patient? Pls advise.

## 2011-11-03 ENCOUNTER — Encounter: Payer: Self-pay | Admitting: Cardiology

## 2011-11-03 NOTE — Telephone Encounter (Signed)
lmomtcb to ask whether she wants it mailed or to p/u

## 2011-11-03 NOTE — Telephone Encounter (Signed)
Letter completed by [facility] and given to triage.

## 2011-11-05 NOTE — Telephone Encounter (Signed)
LMOM for pt TCB.  Need to know if she wants to pick up letter or wants it mailed to her.

## 2011-11-08 NOTE — Telephone Encounter (Signed)
Pt wants to pick-up letter, so letter placed at front.Carron Curie, CMA

## 2011-11-10 ENCOUNTER — Encounter: Payer: Self-pay | Admitting: Cardiology

## 2011-11-17 ENCOUNTER — Other Ambulatory Visit: Payer: Self-pay

## 2011-11-17 ENCOUNTER — Ambulatory Visit: Payer: Self-pay | Admitting: Cardiology

## 2011-11-21 ENCOUNTER — Other Ambulatory Visit: Payer: Self-pay | Admitting: Pulmonary Disease

## 2011-11-21 DIAGNOSIS — G4733 Obstructive sleep apnea (adult) (pediatric): Secondary | ICD-10-CM

## 2011-11-23 ENCOUNTER — Other Ambulatory Visit: Payer: Self-pay

## 2011-11-23 ENCOUNTER — Inpatient Hospital Stay (HOSPITAL_COMMUNITY)
Admission: AD | Admit: 2011-11-23 | Discharge: 2011-11-26 | DRG: 291 | Disposition: A | Discharge status: Home / Self Care | Payer: Medicaid Other | Attending: Internal Medicine | Admitting: Internal Medicine

## 2011-11-23 ENCOUNTER — Emergency Department (HOSPITAL_COMMUNITY): Payer: Medicaid Other

## 2011-11-23 ENCOUNTER — Encounter (HOSPITAL_COMMUNITY): Payer: Self-pay | Admitting: Emergency Medicine

## 2011-11-23 DIAGNOSIS — I5033 Acute on chronic diastolic (congestive) heart failure: Principal | ICD-10-CM | POA: Diagnosis present

## 2011-11-23 DIAGNOSIS — R0902 Hypoxemia: Secondary | ICD-10-CM | POA: Diagnosis present

## 2011-11-23 DIAGNOSIS — I509 Heart failure, unspecified: Secondary | ICD-10-CM | POA: Diagnosis present

## 2011-11-23 DIAGNOSIS — E669 Obesity, unspecified: Secondary | ICD-10-CM | POA: Diagnosis present

## 2011-11-23 DIAGNOSIS — Z7982 Long term (current) use of aspirin: Secondary | ICD-10-CM

## 2011-11-23 DIAGNOSIS — E119 Type 2 diabetes mellitus without complications: Secondary | ICD-10-CM | POA: Diagnosis present

## 2011-11-23 DIAGNOSIS — I10 Essential (primary) hypertension: Secondary | ICD-10-CM | POA: Diagnosis present

## 2011-11-23 DIAGNOSIS — Z8249 Family history of ischemic heart disease and other diseases of the circulatory system: Secondary | ICD-10-CM

## 2011-11-23 DIAGNOSIS — G4733 Obstructive sleep apnea (adult) (pediatric): Secondary | ICD-10-CM | POA: Diagnosis present

## 2011-11-23 DIAGNOSIS — Z79899 Other long term (current) drug therapy: Secondary | ICD-10-CM

## 2011-11-23 DIAGNOSIS — Z823 Family history of stroke: Secondary | ICD-10-CM

## 2011-11-23 DIAGNOSIS — E1140 Type 2 diabetes mellitus with diabetic neuropathy, unspecified: Secondary | ICD-10-CM | POA: Diagnosis present

## 2011-11-23 DIAGNOSIS — E785 Hyperlipidemia, unspecified: Secondary | ICD-10-CM | POA: Diagnosis present

## 2011-11-23 DIAGNOSIS — J9600 Acute respiratory failure, unspecified whether with hypoxia or hypercapnia: Secondary | ICD-10-CM | POA: Diagnosis present

## 2011-11-23 DIAGNOSIS — I428 Other cardiomyopathies: Secondary | ICD-10-CM | POA: Diagnosis present

## 2011-11-23 DIAGNOSIS — I5032 Chronic diastolic (congestive) heart failure: Secondary | ICD-10-CM | POA: Diagnosis present

## 2011-11-23 DIAGNOSIS — Z794 Long term (current) use of insulin: Secondary | ICD-10-CM

## 2011-11-23 LAB — CBC
MCHC: 32.8 g/dL (ref 30.0–36.0)
Platelets: 308 10*3/uL (ref 150–400)
Platelets: 264 10*3/uL (ref 150–400)
RBC: 4.66 MIL/uL (ref 3.87–5.11)
RDW: 13 % (ref 11.5–15.5)
WBC: 10.6 10*3/uL — ABNORMAL HIGH (ref 4.0–10.5)

## 2011-11-23 LAB — COMPREHENSIVE METABOLIC PANEL
ALT: 17 U/L (ref 0–35)
Alkaline Phosphatase: 101 U/L (ref 39–117)
CO2: 27 mEq/L (ref 19–32)
GFR calc Af Amer: >90 mL/min (ref >90)
GFR calc non Af Amer: >90 mL/min (ref >90)
Glucose, Bld: 280 mg/dL — ABNORMAL HIGH (ref 70–99)
Potassium: 3.6 mEq/L (ref 3.5–5.1)
Sodium: 137 mEq/L (ref 135–145)

## 2011-11-23 LAB — GLUCOSE, CAPILLARY: Glucose-Capillary: 302 mg/dL — ABNORMAL HIGH (ref 70–99)

## 2011-11-23 LAB — DIFFERENTIAL
Lymphocytes Relative: 41 % (ref 12–46)
Lymphs Abs: 4.3 10*3/uL — ABNORMAL HIGH (ref 0.7–4.0)
Neutro Abs: 5.2 10*3/uL (ref 1.7–7.7)
Neutrophils Relative %: 49 % (ref 43–77)

## 2011-11-23 LAB — CREATININE, SERUM
Creatinine, Ser: 0.79 mg/dL (ref 0.50–1.10)
GFR calc non Af Amer: >90 mL/min (ref >90)

## 2011-11-23 LAB — CARDIAC PANEL(CRET KIN+CKTOT+MB+TROPI): Troponin I: <0.3 ng/mL (ref <0.30)

## 2011-11-23 MED ORDER — PANTOPRAZOLE SODIUM 40 MG PO TBEC
40.0000 mg | DELAYED_RELEASE_TABLET | Freq: Every day | ORAL | Status: DC
  Administered 2011-11-23 – 2011-11-26 (×4): 40 mg via ORAL
  Filled 2011-11-23 (×4): qty 1

## 2011-11-23 MED ORDER — HYDRALAZINE HCL 10 MG PO TABS
10.0000 mg | ORAL_TABLET | Freq: Three times a day (TID) | ORAL | Status: DC
  Administered 2011-11-23 – 2011-11-26 (×9): 10 mg via ORAL
  Filled 2011-11-23 (×12): qty 1

## 2011-11-23 MED ORDER — OMEGA-3-ACID ETHYL ESTERS 1 G PO CAPS
1.0000 g | ORAL_CAPSULE | Freq: Every day | ORAL | Status: DC
  Administered 2011-11-23 – 2011-11-26 (×4): 1 g via ORAL
  Filled 2011-11-23 (×4): qty 1

## 2011-11-23 MED ORDER — INSULIN ASPART 100 UNIT/ML ~~LOC~~ SOLN
0.0000 [IU] | Freq: Three times a day (TID) | SUBCUTANEOUS | Status: DC
  Administered 2011-11-23: 15 [IU] via SUBCUTANEOUS
  Administered 2011-11-24: 8 [IU] via SUBCUTANEOUS
  Filled 2011-11-23: qty 3

## 2011-11-23 MED ORDER — ALBUTEROL SULFATE HFA 108 (90 BASE) MCG/ACT IN AERS
2.0000 | INHALATION_SPRAY | Freq: Four times a day (QID) | RESPIRATORY_TRACT | Status: DC | PRN
  Filled 2011-11-23: qty 6.7

## 2011-11-23 MED ORDER — INSULIN ASPART 100 UNIT/ML ~~LOC~~ SOLN
4.0000 [IU] | Freq: Three times a day (TID) | SUBCUTANEOUS | Status: DC
  Administered 2011-11-23: 4 [IU] via SUBCUTANEOUS
  Filled 2011-11-23: qty 3

## 2011-11-23 MED ORDER — SODIUM CHLORIDE 0.9 % IJ SOLN
3.0000 mL | Freq: Two times a day (BID) | INTRAMUSCULAR | Status: DC
  Administered 2011-11-23 – 2011-11-26 (×7): 3 mL via INTRAVENOUS

## 2011-11-23 MED ORDER — INSULIN ASPART 100 UNIT/ML ~~LOC~~ SOLN
0.0000 [IU] | Freq: Three times a day (TID) | SUBCUTANEOUS | Status: DC
  Filled 2011-11-23: qty 3

## 2011-11-23 MED ORDER — HYDROCODONE-ACETAMINOPHEN 10-325 MG PO TABS
2.0000 | ORAL_TABLET | Freq: Four times a day (QID) | ORAL | Status: DC | PRN
  Administered 2011-11-23: 2 via ORAL
  Filled 2011-11-23: qty 2

## 2011-11-23 MED ORDER — METOPROLOL SUCCINATE ER 100 MG PO TB24
100.0000 mg | ORAL_TABLET | Freq: Two times a day (BID) | ORAL | Status: DC
  Administered 2011-11-23 – 2011-11-26 (×7): 100 mg via ORAL
  Filled 2011-11-23 (×9): qty 1

## 2011-11-23 MED ORDER — CITALOPRAM HYDROBROMIDE 20 MG PO TABS
20.0000 mg | ORAL_TABLET | Freq: Every day | ORAL | Status: DC
  Administered 2011-11-23 – 2011-11-26 (×4): 20 mg via ORAL
  Filled 2011-11-23 (×4): qty 1

## 2011-11-23 MED ORDER — ENOXAPARIN SODIUM 80 MG/0.8ML ~~LOC~~ SOLN
65.0000 mg | SUBCUTANEOUS | Status: DC
  Administered 2011-11-23 – 2011-11-26 (×4): 65 mg via SUBCUTANEOUS
  Filled 2011-11-23 (×7): qty 0.8

## 2011-11-23 MED ORDER — INSULIN ASPART PROT & ASPART (70-30 MIX) 100 UNIT/ML ~~LOC~~ SUSP
15.0000 [IU] | Freq: Two times a day (BID) | SUBCUTANEOUS | Status: DC
  Administered 2011-11-23 – 2011-11-24 (×3): 15 [IU] via SUBCUTANEOUS
  Filled 2011-11-23: qty 3

## 2011-11-23 MED ORDER — ISOSORBIDE MONONITRATE ER 30 MG PO TB24
30.0000 mg | ORAL_TABLET | Freq: Every day | ORAL | Status: DC
  Administered 2011-11-23 – 2011-11-26 (×4): 30 mg via ORAL
  Filled 2011-11-23 (×4): qty 1

## 2011-11-23 MED ORDER — SODIUM CHLORIDE 0.9 % IJ SOLN: 3.0000 mL | INTRAMUSCULAR | Status: DC | PRN

## 2011-11-23 MED ORDER — POTASSIUM CHLORIDE CRYS ER 20 MEQ PO TBCR
20.0000 meq | EXTENDED_RELEASE_TABLET | Freq: Every day | ORAL | Status: DC
  Administered 2011-11-23 – 2011-11-26 (×4): 20 meq via ORAL
  Filled 2011-11-23 (×4): qty 1

## 2011-11-23 MED ORDER — SODIUM CHLORIDE 0.9 % IV SOLN: 250.0000 mL | INTRAVENOUS | Status: DC | PRN

## 2011-11-23 MED ORDER — ENOXAPARIN SODIUM 40 MG/0.4ML ~~LOC~~ SOLN
40.0000 mg | SUBCUTANEOUS | Status: DC
  Filled 2011-11-23: qty 0.4

## 2011-11-23 MED ORDER — OXYMETAZOLINE HCL 0.05 % NA SOLN
1.0000 | Freq: Two times a day (BID) | NASAL | Status: AC | PRN
  Administered 2011-11-23: 1 via NASAL
  Filled 2011-11-23: qty 15

## 2011-11-23 MED ORDER — LORAZEPAM 2 MG/ML IJ SOLN
1.0000 mg | Freq: Once | INTRAMUSCULAR | Status: AC
  Administered 2011-11-23: 1 mg via INTRAVENOUS
  Filled 2011-11-23: qty 1

## 2011-11-23 MED ORDER — ASPIRIN 325 MG PO TABS
325.0000 mg | ORAL_TABLET | Freq: Every day | ORAL | Status: DC
  Administered 2011-11-23 – 2011-11-26 (×4): 325 mg via ORAL
  Filled 2011-11-23 (×4): qty 1

## 2011-11-23 MED ORDER — FUROSEMIDE 10 MG/ML IJ SOLN
40.0000 mg | Freq: Once | INTRAMUSCULAR | Status: AC
  Administered 2011-11-23: 40 mg via INTRAVENOUS
  Filled 2011-11-23: qty 4

## 2011-11-23 MED ORDER — AMLODIPINE BESYLATE 10 MG PO TABS
10.0000 mg | ORAL_TABLET | Freq: Every day | ORAL | Status: DC
  Administered 2011-11-23 – 2011-11-26 (×4): 10 mg via ORAL
  Filled 2011-11-23 (×4): qty 1

## 2011-11-23 MED ORDER — INSULIN ASPART 100 UNIT/ML ~~LOC~~ SOLN
0.0000 [IU] | Freq: Every day | SUBCUTANEOUS | Status: DC
  Filled 2011-11-23: qty 3

## 2011-11-23 MED ORDER — FUROSEMIDE 10 MG/ML IJ SOLN
80.0000 mg | Freq: Two times a day (BID) | INTRAMUSCULAR | Status: DC
  Administered 2011-11-23 – 2011-11-24 (×3): 80 mg via INTRAMUSCULAR
  Filled 2011-11-23 (×5): qty 8

## 2011-11-23 NOTE — ED Notes (Signed)
Attempted to call report to [phone] at this time.  Artist receiving pt is still in morning report.   This RN states will call back.

## 2011-11-23 NOTE — ED Provider Notes (Signed)
History     CSN: [id number] Arrival date & time: 11/23/2011  2:09 AM   First MD Initiated Contact with Patient 11/23/11 0244      Chief Complaint  Patient presents with  . Shortness of Breath    (Consider location/radiation/quality/duration/timing/severity/associated sxs/prior treatment) HPI 45 year old female presents emergency department with report of worsening shortness of breath, abdominal and leg swelling over the last 4-5 days. Patient reports similar presentation in October requiring admission. Patient reports history of congestive heart failure, and is on Lasix 80 in the morning and 40 in the evening. She denies missing any doses. Patient also reports history of obstructive sleep apnea is on CPAP at night. Dr. Shelle Iron with pulmonology called yesterday and told her to increase to 7 tonight. Patient has been unable to sleep secondary to shortness of breath even with elevated pillows to sleep in a recliner. She denies any chest pain. Past Medical History  Diagnosis Date  . Hypertension   . Diabetes mellitus, type 2     HbA1C  11.1 8/12  . NICM (nonischemic cardiomyopathy)     EF 45-50% in 8/12, cath 6/12 showed normal coronaries  . Morbid obesity   . Allergic rhinitis   . HLD (hyperlipidemia)     LDL 110.9 in 9/12  . OSA (obstructive sleep apnea)     sleep study in 8/12 showed moderate to severe OSA requiring CPAP  . Chronic lower back pain     secondary to DJD, obseity, hip problems. Sees Dr. Ivory Broad  . 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 CHF (congestive heart failure)     multiple admission, last one in 8/12. Discharge weight  135 kg    Past Surgical History  Procedure Date  . Breast biopsies   . Tubal ligation 05/31/1985  . Hand surgery     Left  . Shoulder surgery     right  . Dental surgery     tumor removed     Family History  Problem Relation Age of Onset  . Heart disease Mother   . Heart disease Paternal Grandmother   .  Heart disease Paternal Grandfather   . Heart disease Father   . Stroke Paternal Grandfather     History  Substance Use Topics  . Smoking status: Never Smoker   . Smokeless tobacco: Not on file  . Alcohol Use: No    OB History    Grav Para Term Preterm Abortions TAB SAB Ect Mult Living                  Review of Systems  Unable to perform ROS: Other   dyspnea precludes full review of systems  Allergies  Clindamycin; Enalapril maleate; and Lisinopril  Home Medications   Current Outpatient Rx  Name Route Sig Dispense Refill  . ALBUTEROL SULFATE HFA 108 (90 BASE) MCG/ACT IN AERS Inhalation Inhale 2 puffs into the lungs every 6 (six) hours as needed.      [name] AMLODIPINE BESYLATE 5 MG PO TABS Oral Take 2 tablets (10 mg total) by mouth daily. 60 tablet 11  . ASPIRIN 325 MG PO TABS Oral Take 325 mg by mouth daily.      [name] CITALOPRAM HYDROBROMIDE 20 MG PO TABS Oral Take 20 mg by mouth daily.      . FUROSEMIDE 40 MG PO TABS  TAKE  80 MG IN THE AM AND TAKE 40 IN THE PM 60 tablet 11  .  INSULIN ASPART PROT & ASPART (70-30) 100 UNIT/ML [place] SUSP Subcutaneous Inject 15 Units into the skin 2 (two) times daily with a meal.      . METFORMIN HCL ER (MOD) 1000 MG PO TB24 Oral Take 1,000 mg by mouth 2 (two) times daily with a meal.      . METOPROLOL SUCCINATE ER 100 MG PO TB24 Oral Take 100 mg by mouth 2 (two) times daily.      [name] FISH OIL 1000 MG PO CAPS Oral Take 2 capsules (2,000 mg total) by mouth daily. 60 capsule 3  . PANTOPRAZOLE SODIUM 40 MG PO TBEC Oral Take 40 mg by mouth daily.      [name] POTASSIUM CHLORIDE 10 MEQ PO TBCR Oral Take 2 tablets (20 mEq total) by mouth daily. 60 tablet 11    BP 125/92  Pulse 105  Temp(Src) 97.9 F (36.6 C) (Oral)  Resp 17  SpO2 98%  LMP 11/16/2011  Physical Exam  Nursing note and vitals reviewed. Constitutional:       WBC uncomfortable appearing woman sitting on the side of bed speaking in short sentences  HENT:  Head: Normocephalic and atraumatic.    Eyes: Pupils are equal, round, and reactive to light.  Neck: No JVD present. No tracheal deviation present. No thyromegaly present.       No JVD noted  Cardiovascular: Normal rate, regular rhythm and normal heart sounds.   Pulmonary/Chest: No stridor. She is in respiratory distress. She has no wheezes. She has rales (Rales noted right greater than left). She exhibits no tenderness.  Abdominal: Soft. Bowel sounds are normal. She exhibits distension. She exhibits no mass. There is no tenderness. There is no rebound and no guarding.  Musculoskeletal: Normal range of motion. She exhibits edema. She exhibits no tenderness.  Lymphadenopathy:    She has no cervical adenopathy.  Neurological: She is alert.  Skin: Skin is warm and dry. No rash noted. No erythema. No pallor.    ED Course  Procedures (including critical care time)  Labs Reviewed  CBC - Abnormal; Notable for the following:    WBC 10.6 (*)    All other components within normal limits  DIFFERENTIAL - Abnormal; Notable for the following:    Lymphs Abs 4.3 (*)    All other components within normal limits  COMPREHENSIVE METABOLIC PANEL - Abnormal; Notable for the following:    Glucose, Bld 280 (*)    BUN 5 (*)    All other components within normal limits  PRO B NATRIURETIC PEPTIDE - Abnormal; Notable for the following:    Pro B Natriuretic peptide (BNP) 399.3 (*)    All other components within normal limits  CARDIAC PANEL(CRET KIN+CKTOT+MB+TROPI)   Dg Chest 2 View  11/23/2011  *RADIOLOGY REPORT*  Clinical Data: Shortness of breath and cough  CHEST - 2 VIEW  Comparison: 10/01/2011  Findings: Cardiac enlargement with some prominence of pulmonary vascularity.  Basilar airspace and interstitial changes.  Findings are similar to the previous study and likely represent congestive failure with edema.  Superimposed pneumonia is not excluded.  No pneumothorax.  No blunting of costophrenic angles.  IMPRESSION: Cardiac enlargement with  pulmonary vascular congestion and basilar air space and interstitial edema.  Superimposed pneumonia not excluded.  Original Report Authenticated By: Marlon Pel, M.D.     Date: 11/23/2011  Rate: 141   Rhythm: sinus tachycardia  QRS Axis: normal  Intervals: normal  ST/T Wave abnormalities: normal  Conduction Disutrbances:left bundle branch block  Narrative Interpretation: left atrial enlargement, low voltages  Old EKG Reviewed: changes noted   1. Diastolic CHF, chronic   2. OSA (obstructive sleep apnea)   3. Hypoxia       MDM  Patient with significant improvement after placement of BiPAP with normalization of vitals and improvement in O2 sats. Patient does not normally require oxygen at home. Patient reports feeling much better after placement of BiPAP. Discussed with hospitalist for admission the and morning team will come to assess and admit her       Olivia Mackie, MD 11/23/11 [phone]

## 2011-11-23 NOTE — Progress Notes (Signed)
ADMITTED FROM THE ED BY STRETCHER AWAKE AND ALERT, BIPAP IN USED, NOT IN ACUTE RESP. DISTRESS.

## 2011-11-23 NOTE — Progress Notes (Signed)
Patient ID: [name], female   DOB: 04-12-1966, 45 y.o.   MRN: [id number] PCP:  Georganna Skeans, MD, MD   DOA:  11/23/2011  2:09 AM  Chief Complaint:  Shortness of breath since 3 days  HPI: 45 y.o. female with a PMHx of DM, NICM, HTN, HLD, OSA on CPAP with frequent hospitalization for diastolic CHF exacerbation came to the ED with shortness of breath for 3 days. She was in her usual state of health before this and was taking her medications regularly. She started having progressive SOB with orthopnea . Patient feels her symptoms are similar to her previous to her CHF exacerbation. Patient also c/o cough with runny nose since last evening. She has been having increase leg swellings for past few day and also feels abdominal fullness. denies chest pain, palpitations, headache, dizziness, blurring of vision , abdominal pain, bowel or urinary symptoms.  She is also having cough mostly on lying flat with whitish productive sputum. She denies fever or chills. She denies increased salt intake. She has OSA but uses CPAP only occasionally because of discomfort she follows with Dr  Marcelyn Bruins from pulmonary who recently adjusted her CPAP to 7 at night.. Patient had a CXR done in ED showing pulmonary congestion and was given 40 mg IV lasix and placed on BiPAP. On my evaluation she informs feeling better and maintaining sats on BiPAP.  Allergies: Allergies  Allergen Reactions  . Clindamycin     REACTION: Reaction not known  . Enalapril Maleate     REACTION: Reaction not known  . Lisinopril     REACTION: Reaction not known    Prior to Admission medications   Medication Sig Start Date End Date Taking? Authorizing Provider  albuterol (VENTOLIN HFA) 108 (90 BASE) MCG/ACT inhaler Inhale 2 puffs into the lungs every 6 (six) hours as needed.     Yes Historical Provider, MD  amLODipine (NORVASC) 5 MG tablet Take 2 tablets (10 mg total) by mouth daily. 09/03/11 09/02/12 Yes Beatrice Lecher, PA  aspirin 325 MG  tablet Take 325 mg by mouth daily.     Yes Historical Provider, MD  citalopram (CELEXA) 20 MG tablet Take 20 mg by mouth daily.     Yes Historical Provider, MD  furosemide (LASIX) 40 MG tablet TAKE  80 MG IN THE AM AND TAKE 40 IN THE PM 09/03/11  Yes Beatrice Lecher, PA  insulin aspart protamine-insulin aspart (NOVOLOG 70/30) (70-30) 100 UNIT/ML injection Inject 15 Units into the skin 2 (two) times daily with a meal.     Yes Historical Provider, MD  metFORMIN (GLUMETZA) 1000 MG (MOD) 24 hr tablet Take 1,000 mg by mouth 2 (two) times daily with a meal.     Yes Historical Provider, MD  metoprolol (TOPROL-XL) 100 MG 24 hr tablet Take 100 mg by mouth 2 (two) times daily.     Yes Historical Provider, MD  Omega-3 Fatty Acids (FISH OIL) 1000 MG CAPS Take 2 capsules (2,000 mg total) by mouth daily. 09/03/11  Yes Beatrice Lecher, PA  pantoprazole (PROTONIX) 40 MG tablet Take 40 mg by mouth daily.     Yes Historical Provider, MD  potassium chloride (KLOR-CON) 10 MEQ CR tablet Take 2 tablets (20 mEq total) by mouth daily. 09/03/11  Yes Beatrice Lecher, PA   From her recent discharge patient was also started on po imdur 30 mg daily  Past Medical History  Diagnosis Date  . Hypertension   . Diabetes mellitus, type  2     HbA1C  11.1 8/12  . NICM (nonischemic cardiomyopathy)     EF 45-50% in 8/12, cath 6/12 showed normal coronaries  . Morbid obesity   . Allergic rhinitis   . HLD (hyperlipidemia)     LDL 110.9 in 9/12  . OSA (obstructive sleep apnea)     sleep study in 8/12 showed moderate to severe OSA requiring CPAP  . Chronic lower back pain     secondary to DJD, obseity, hip problems. Sees Dr. Ivory Broad  . 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 CHF (congestive heart failure)     multiple admission, last one in 8/12. Discharge weight  135 kg    Past Surgical History  Procedure Date  . Breast biopsies   . Tubal ligation 05/31/1985  . Hand surgery     Left  . Shoulder surgery       right  . Dental surgery     tumor removed     Social History:  reports that she has never smoked.  She reports that she does not drink alcohol or use illicit drugs.  Family History  Problem Relation Age of Onset  . Heart disease Mother   . Heart disease Paternal Grandmother   . Heart disease Paternal Grandfather   . Heart disease Father   . Stroke Paternal Grandfather     Review of Systems:  Constitutional: Denies fever, chills, diaphoresis, appetite change and fatigue. subjective weight gain HEENT: Denies photophobia, eye pain, redness, hearing loss, ear pain, congestion, sore throat, rhinorrhea, sneezing, mouth sores, trouble swallowing, neck pain, neck stiffness and tinnitus.   Respiratory: positive for  SOB, DOE, orthopnea, PND, cough, denies chest tightness,  and wheezing.   Cardiovascular: Denies chest pain, palpitations , + leg swelling.  Gastrointestinal: Denies nausea, vomiting, abdominal pain, diarrhea, constipation, blood in stool and +abdominal distention.  Genitourinary: Denies dysuria, urgency, frequency, hematuria, flank pain and difficulty urinating.  Musculoskeletal: Denies myalgias, back pain, joint swelling, arthralgias and gait problem.  Skin: Denies pallor, rash and wound.  Neurological: Denies dizziness, seizures, syncope, weakness, light-headedness, numbness and headaches.  Hematological: Denies adenopathy. Easy bruising, personal or family bleeding history  Psychiatric/Behavioral: Denies suicidal ideation, mood changes, confusion, nervousness, sleep disturbance and agitation   Physical Exam:  Filed Vitals:   11/23/11 0645 11/23/11 0700 11/23/11 0715 11/23/11 0800  BP: 106/66 106/55 106/59 125/92  Pulse: 105 106 105 105  Temp:      TempSrc:      Resp: 17 16 16 17   SpO2: 95% 96% 97% 98%    Constitutional: Vital signs reviewed.  Middle aged obese female lying in bed in BiPAP HEENT: no pallor, no icterus, moist mucosa, JVD++ Cardiovascular: RRR, S1  normal, S2 normal, no MRG, pulses symmetric and intact bilaterally Pulmonary/Chest: Diminished breath sounds b/l, bibasilar crackles Abdominal: Soft. Non-tender, non-distended, bowel sounds are normal, no masses, organomegaly, or guarding present.  GU: no CVA tenderness Musculoskeletal: No joint deformities, erythema, or stiffness, ROM full and no nontender Ext: 2 + pitting edema b/l Neurological: A&O x3, non focal  Labs on Admission:  Results for orders placed during the hospital encounter of 11/23/11 (from the past 48 hour(s))  CBC     Status: Abnormal   Collection Time   11/23/11  2:31 AM      Component Value Range Comment   WBC 10.6 (*) 4.0 - 10.5 (K/uL)    RBC 4.66  3.87 - 5.11 (  MIL/uL)    Hemoglobin 13.0  12.0 - 15.0 (g/dL)    HCT 16.1  09.6 - 04.5 (%)    MCV 83.7  78.0 - 100.0 (fL)    MCH 27.9  26.0 - 34.0 (pg)    MCHC 33.3  30.0 - 36.0 (g/dL)    RDW 40.9  81.1 - 91.4 (%)    Platelets 308  150 - 400 (K/uL)   DIFFERENTIAL     Status: Abnormal   Collection Time   11/23/11  2:31 AM      Component Value Range Comment   Neutrophils Relative 49  43 - 77 (%)    Neutro Abs 5.2  1.7 - 7.7 (K/uL)    Lymphocytes Relative 41  12 - 46 (%)    Lymphs Abs 4.3 (*) 0.7 - 4.0 (K/uL)    Monocytes Relative 8  3 - 12 (%)    Monocytes Absolute 0.8  0.1 - 1.0 (K/uL)    Eosinophils Relative 1  0 - 5 (%)    Eosinophils Absolute 0.1  0.0 - 0.7 (K/uL)    Basophils Relative 1  0 - 1 (%)    Basophils Absolute 0.1  0.0 - 0.1 (K/uL)   COMPREHENSIVE METABOLIC PANEL     Status: Abnormal   Collection Time   11/23/11  2:31 AM      Component Value Range Comment   Sodium 137  135 - 145 (mEq/L)    Potassium 3.6  3.5 - 5.1 (mEq/L)    Chloride 99  96 - 112 (mEq/L)    CO2 27  19 - 32 (mEq/L)    Glucose, Bld 280 (*) 70 - 99 (mg/dL)    BUN 5 (*) 6 - 23 (mg/dL)    Creatinine, Ser 7.82  0.50 - 1.10 (mg/dL)    Calcium 9.3  8.4 - 10.5 (mg/dL)    Total Protein 7.6  6.0 - 8.3 (g/dL)    Albumin 3.8  3.5 - 5.2  (g/dL)    AST 17  0 - 37 (U/L)    ALT 17  0 - 35 (U/L)    Alkaline Phosphatase 101  39 - 117 (U/L)    Total Bilirubin 0.5  0.3 - 1.2 (mg/dL)    GFR calc non Af Amer >90  >90 (mL/min)    GFR calc Af Amer >90  >90 (mL/min)   PRO B NATRIURETIC PEPTIDE     Status: Abnormal   Collection Time   11/23/11  2:31 AM      Component Value Range Comment   Pro B Natriuretic peptide (BNP) 399.3 (*) 0 - 125 (pg/mL)   CARDIAC PANEL(CRET KIN+CKTOT+MB+TROPI)     Status: Normal   Collection Time   11/23/11  3:20 AM      Component Value Range Comment   Total CK 92  7 - 177 (U/L)    CK, MB 2.4  0.3 - 4.0 (ng/mL)    Troponin I <0.30  <0.30 (ng/mL)    Relative Index RELATIVE INDEX IS INVALID  0.0 - 2.5      Radiological Exams on Admission: CHEST - 2 VIEW  Comparison: 10/01/2011  Findings: Cardiac enlargement with some prominence of pulmonary  vascularity. Basilar airspace and interstitial changes. Findings  are similar to the previous study and likely represent congestive  failure with edema. Superimposed pneumonia is not excluded. No  pneumothorax. No blunting of costophrenic angles.  IMPRESSION:  Cardiac enlargement with pulmonary vascular congestion and basilar  air space and interstitial  edema. Superimposed pneumonia not  excluded.  Original Report Authenticated By: Marlon Pel, M.D.   Assessment/Plan 45 y.o. female with a PMHx of DM, NICM, HTN, HLD, OSA on CPAP with frequent hospitalization for diastolic CHF exacerbation came to the ED with shortness of breath for 3 days. Patient given IV lasix in ED and placed on BiPAP and plan to admit in step down for acute  CHF exacerbation.   PLAN:  1. Acute CHF exacerbation.  Likely decompensation of her dastolic dyssfunction associated with viral URI likely symptoms Admit to stepdown  continue BiPAP for now Will diurese her aggressively with IV lasix 80 mg bid.  Foley placed in ED.  Monitor daily weights and strict I/O Patient's dry  weight as per previous note is 130 kgs .She seems to have gained 5 kgs on admission. Last echo from 8 /12 with EF of 45-50% and suggestive of  diastolic dysfn. Will not repeat one for now She follows with [facility] cardiology and can be consulted if symptoms do not improve on current treatment. BNP mildly elevated Continue imdur , will add hydralazine  cont ASA and statin  cont metoprolol Continue  albuterol nebs   2. Obstructive sleep apnea. On CPAP at home. innforms to be on a setting of 7 recently titrated by her pulmonologist Dr Shelle Iron. It seems she was non compliant with it in past, but she informs me she has been using it lately.  continue nightly CPAP at home setting.  3. Diabetes mellitus.  Cont SSI andd monitor fsg  cont home dose of novolog  4. Hypertension. Noted to be hypertensive on admission  resume home meds including amlod, metoprolol and imdur. Will add hydralazine Iv Lasix   5. Osteoarthritis. Pain controlled with Tylenol.  .  7. Depression.  Stable cont home meds  9. Hyperlipidemia.  On fatty acids at home and will be continued  Diet : cardiac and diabetic  DVT prophylaxis: sq lovenox  Full code  Time Spent on Admission: 45 minutes  Lazaro Isenhower 11/23/2011, 8:17 AM

## 2011-11-23 NOTE — Progress Notes (Signed)
COMPLAINED OF FEELING WEAK, WITH COLD PERSPIRATION, BP- 107/75, HR 104, R-20 T- 98.2 ORALLY CBG- 302. INSULIN 70/30 15 UNITS GIVEN. STAT EKG DONE, DR DHUNGEL MADE AWARE AND CAME TO SEE PT AT ONCE, RESULT OF EKG SEEN BY SAME. AFTER FEW MIN PT CLAIMED THAT SHE FEELS BETTER. CONTINUE TO MONITOR.

## 2011-11-23 NOTE — Progress Notes (Signed)
Patient transported from ED to ICU on bipap.  Patient tolerated transport well. Bipap connected in unit and report given to receiving RT.  Patient will continue to be monitored by Respiratory Therapy.

## 2011-11-23 NOTE — ED Notes (Signed)
PT. REPORTS PROGRESSING SOB WITH PRODUCTIVE COUGH FOR 2 DAYS , WORSE WITH EXERTION ,  SLIGHT CHILLS WITH NO FEVER .

## 2011-11-24 LAB — BASIC METABOLIC PANEL
BUN: 13 mg/dL (ref 6–23)
CO2: 29 mEq/L (ref 19–32)
Chloride: 93 mEq/L — ABNORMAL LOW (ref 96–112)
Creatinine, Ser: 0.86 mg/dL (ref 0.50–1.10)
Potassium: 4.2 mEq/L (ref 3.5–5.1)

## 2011-11-24 LAB — HEMOGLOBIN A1C: Hgb A1c MFr Bld: 9.9 % — ABNORMAL HIGH (ref <5.7)

## 2011-11-24 MED ORDER — INSULIN ASPART PROT & ASPART (70-30 MIX) 100 UNIT/ML ~~LOC~~ SUSP
25.0000 [IU] | Freq: Two times a day (BID) | SUBCUTANEOUS | Status: DC
  Administered 2011-11-24 – 2011-11-25 (×2): 25 [IU] via SUBCUTANEOUS

## 2011-11-24 MED ORDER — BIOTENE DRY MOUTH MT LIQD
15.0000 mL | Freq: Two times a day (BID) | OROMUCOSAL | Status: DC
  Administered 2011-11-24: 15 mL via OROMUCOSAL

## 2011-11-24 MED ORDER — INSULIN ASPART PROT & ASPART (70-30 MIX) 100 UNIT/ML ~~LOC~~ SUSP: 125.0000 [IU] | Freq: Two times a day (BID) | SUBCUTANEOUS | Status: DC

## 2011-11-24 MED ORDER — INSULIN ASPART 100 UNIT/ML ~~LOC~~ SOLN
0.0000 [IU] | Freq: Every day | SUBCUTANEOUS | Status: DC
  Administered 2011-11-24: 2 [IU] via SUBCUTANEOUS

## 2011-11-24 MED ORDER — INSULIN ASPART 100 UNIT/ML ~~LOC~~ SOLN
0.0000 [IU] | Freq: Three times a day (TID) | SUBCUTANEOUS | Status: DC
  Administered 2011-11-24: 7 [IU] via SUBCUTANEOUS
  Administered 2011-11-24 – 2011-11-25 (×4): 4 [IU] via SUBCUTANEOUS
  Administered 2011-11-26 (×2): 3 [IU] via SUBCUTANEOUS
  Filled 2011-11-24: qty 3

## 2011-11-24 MED ORDER — FUROSEMIDE 10 MG/ML IJ SOLN
80.0000 mg | Freq: Two times a day (BID) | INTRAMUSCULAR | Status: AC
  Administered 2011-11-24 – 2011-11-25 (×3): 80 mg via INTRAVENOUS
  Filled 2011-11-24 (×2): qty 8

## 2011-11-24 MED ORDER — INSULIN ASPART 100 UNIT/ML ~~LOC~~ SOLN
6.0000 [IU] | Freq: Three times a day (TID) | SUBCUTANEOUS | Status: DC
  Administered 2011-11-24 – 2011-11-26 (×7): 6 [IU] via SUBCUTANEOUS

## 2011-11-24 MED ORDER — ONDANSETRON HCL 4 MG PO TABS
4.0000 mg | ORAL_TABLET | Freq: Once | ORAL | Status: AC
  Administered 2011-11-25: 4 mg via ORAL
  Filled 2011-11-24: qty 1

## 2011-11-24 MED ORDER — FUROSEMIDE 10 MG/ML IJ SOLN: 80.0000 mg | Freq: Two times a day (BID) | INTRAMUSCULAR | Status: DC

## 2011-11-24 MED ORDER — FUROSEMIDE 10 MG/ML IJ SOLN
40.0000 mg | Freq: Two times a day (BID) | INTRAMUSCULAR | Status: DC
  Administered 2011-11-26: 40 mg via INTRAVENOUS
  Filled 2011-11-24 (×2): qty 4

## 2011-11-24 MED ORDER — INSULIN ASPART 100 UNIT/ML ~~LOC~~ SOLN: 15.0000 [IU] | Freq: Once | SUBCUTANEOUS | Status: DC

## 2011-11-24 NOTE — Progress Notes (Signed)
Inpatient Diabetes Program Recommendations  AACE/ADA: New Consensus Statement on Inpatient Glycemic Control (2009)  Target Ranges:  Prepandial:   less than 140 mg/dL      Peak postprandial:   less than 180 mg/dL (1-2 hours)      Critically ill patients:  140 - 180 mg/dL   CBGs today: 161/ 096 mg/dl  Inpatient Diabetes Program Recommendations Insulin - Basal: Noted 70/30 insulin increased to 25 units bid with meals. Insulin - Meal Coverage: Please d/c Novolog 6 units tid with meals- Meal coverage not recommended with 70/30 insulin.

## 2011-11-24 NOTE — Progress Notes (Signed)
Pt. Stated she would place herself on BiPAP when she was ready.

## 2011-11-24 NOTE — Progress Notes (Signed)
Subjective: Alert and sitting up in chair. She endorses respiratory status/breathing has markedly improved since admission. She does complain of excessive fatigue. Denies chest pain or tachycardia palpitations appear  Objective: Vital signs in last 24 hours: Temp:  [97.5 F (36.4 C)-98.5 F (36.9 C)] 98.5 F (36.9 C) (12/12 1151) Pulse Rate:  [79-104] 99  (12/12 1151) Resp:  [13-21] 16  (12/12 0400) BP: (107-131)/(66-89) 117/89 mmHg (12/12 1151) SpO2:  [87 %-98 %] 96 % (12/12 1151) Weight:  [131.6 kg (290 lb 2 oz)] 290 lb 2 oz (131.6 kg) (12/12 0600) Weight change:  Last BM Date: 11/24/11  Intake/Output from previous day: 12/11 0701 - 12/12 0700 In: 390 [P.O.:390] Out: 6203 [Urine:6200; Stool:3] Intake/Output this shift: Total I/O In: 420 [P.O.:420] Out: 900 [Urine:900]  General appearance: alert, cooperative, appears stated age and no distress Resp: Coarse but otherwise clear to auscultation bilaterally, on room air without tachypnea or labored respiratory effort Cardio: regular rate and rhythm, S1, S2 normal, no murmur, click, rub or gallop GI: Obese, soft, non-tender; bowel sounds normal; no masses,  no organomegaly Extremities: extremities normal, atraumatic, no cyanosis or edema Neurologic: Alert and oriented X 3, normal strength and tone.   Lab Results:  [facility] 11/23/11 0948 11/23/11 0231  WBC 9.8 10.6*  HGB 12.5 13.0  HCT 38.1 39.0  PLT 264 308   BMET  Basename 11/24/11 0500 11/23/11 0948 11/23/11 0231  NA 133* -- 137  K 4.2 -- 3.6  CL 93* -- 99  CO2 29 -- 27  GLUCOSE 296* -- 280*  BUN 13 -- 5*  CREATININE 0.86 0.79 --  CALCIUM 9.5 -- 9.3    Studies/Results: Dg Chest 2 View  11/23/2011  *RADIOLOGY REPORT*  Clinical Data: Shortness of breath and cough  CHEST - 2 VIEW  Comparison: 10/01/2011  Findings: Cardiac enlargement with some prominence of pulmonary vascularity.  Basilar airspace and interstitial changes.  Findings are similar to the previous  study and likely represent congestive failure with edema.  Superimposed pneumonia is not excluded.  No pneumothorax.  No blunting of costophrenic angles.  IMPRESSION: Cardiac enlargement with pulmonary vascular congestion and basilar air space and interstitial edema.  Superimposed pneumonia not excluded.  Original Report Authenticated By: Marlon Pel, M.D.    Medications:  I have reviewed the patient's current medications. Scheduled:   . amLODipine  10 mg Oral Daily  . antiseptic oral rinse  15 mL Mouth Rinse BID  . aspirin  325 mg Oral Daily  . citalopram  20 mg Oral Daily  . enoxaparin  65 mg Subcutaneous Q24H  . furosemide  80 mg Intravenous Q12H   Followed by  . furosemide  40 mg Intravenous Q12H  . hydrALAZINE  10 mg Oral Q8H  . insulin aspart  0-20 Units Subcutaneous TID WC  . insulin aspart  0-5 Units Subcutaneous QHS  . insulin aspart  6 Units Subcutaneous TID WC  . insulin aspart protamine-insulin aspart  25 Units Subcutaneous BID WC  . isosorbide mononitrate  30 mg Oral Daily  . metoprolol  100 mg Oral BID  . omega-3 acid ethyl esters  1 g Oral Daily  . pantoprazole  40 mg Oral Daily  . potassium chloride  20 mEq Oral Daily  . sodium chloride  3 mL Intravenous Q12H  . DISCONTD: furosemide  80 mg Intramuscular BID  . DISCONTD: furosemide  80 mg Intravenous BID  . DISCONTD: insulin aspart  0-15 Units Subcutaneous TID WC  . DISCONTD: insulin  aspart  0-15 Units Subcutaneous TID WC  . DISCONTD: insulin aspart  0-5 Units Subcutaneous QHS  . DISCONTD: insulin aspart  15 Units Subcutaneous Once  . DISCONTD: insulin aspart  4 Units Subcutaneous TID WC  . DISCONTD: insulin aspart protamine-insulin aspart  125 Units Subcutaneous BID WC  . DISCONTD: insulin aspart protamine-insulin aspart  15 Units Subcutaneous BID WC    Assessment/Plan:  Principal Problem:  Acute respiratory failure with hypoxia/*Acute on chronic diastolic heart failure: Clinically improved. She has  diuresed over 7500 cc of urine since admission. We'll continue Lasix IV 80 mg every 12 hours x4 more doses then decreased to Lasix IV 40 mg every 12 hours. She is on daily potassium while receiving IV Lasix. We'll check a basic metabolic panel in the morning. She previously was requiring 4 L nasal cannula oxygen and now she is on room air. Last echocardiogram was 09/15/2010. This demonstrated preserved LV function EF 55-60% with severe LVH. Left atrium was mildly to moderately dilated. Although not formally addressed on that echo these findings are highly suggestive of diastolic dysfunction likely grade 2. At this point no indications to repeat a 2-D echocardiogram this admission.  Active Problems:  ESSENTIAL HYPERTENSION Maximum blood pressure since admission was 146/96. We will continue amlodipine,  Imdur, and metoprolol. We will add hydralazine to aid in treatment of her underlying diastolic dysfunction.   Diabetes mellitus Home Lantus dose clarified. CBGs have been persistently greater than 250 since admission. We will increase her sliding scale insulin to resistant scale.   OSA (obstructive sleep apnea) She is to normally use CPAP at home at hour of sleep. She has been noncompliant with this secondary to mask intolerance. She has not utilized CPAP since admission.    Obesity Chronic problem. Nothing new to add.   Hyperlipidemia Continue omega-3 fatty acids as per home regimen.  Disposition: Respiratory status is stable after adequate diuresis. She is appropriate to transfer to the telemetry unit.   LOS: 1 day   Junious Silk, ANP pager [phone] 11/24/2011, 1:38 PM  I have personally examined this patient and reviewed the entire database. I have reviewed the above note, made any necessary editorial changes, and agree with its content.  Lonia Blood, MD [facility]

## 2011-11-25 ENCOUNTER — Other Ambulatory Visit: Payer: Self-pay

## 2011-11-25 LAB — GLUCOSE, CAPILLARY
Glucose-Capillary: 166 mg/dL — ABNORMAL HIGH (ref 70–99)
Glucose-Capillary: 183 mg/dL — ABNORMAL HIGH (ref 70–99)
Glucose-Capillary: 164 mg/dL — ABNORMAL HIGH (ref 70–99)
Glucose-Capillary: 138 mg/dL — ABNORMAL HIGH (ref 70–99)

## 2011-11-25 LAB — CBC
HCT: 38.9 % (ref 36.0–46.0)
Hemoglobin: 12.6 g/dL (ref 12.0–15.0)
MCH: 27.6 pg (ref 26.0–34.0)
MCV: 85.1 fL (ref 78.0–100.0)
RBC: 4.57 MIL/uL (ref 3.87–5.11)
WBC: 15.5 10*3/uL — ABNORMAL HIGH (ref 4.0–10.5)

## 2011-11-25 LAB — RENAL FUNCTION PANEL
CO2: 30 mEq/L (ref 19–32)
Calcium: 9.4 mg/dL (ref 8.4–10.5)
Chloride: 94 mEq/L — ABNORMAL LOW (ref 96–112)
Creatinine, Ser: 1 mg/dL (ref 0.50–1.10)
Glucose, Bld: 169 mg/dL — ABNORMAL HIGH (ref 70–99)

## 2011-11-25 MED ORDER — INSULIN ASPART PROT & ASPART (70-30 MIX) 100 UNIT/ML ~~LOC~~ SUSP
30.0000 [IU] | Freq: Two times a day (BID) | SUBCUTANEOUS | Status: DC
  Administered 2011-11-25 – 2011-11-26 (×2): 30 [IU] via SUBCUTANEOUS

## 2011-11-25 MED ORDER — MAGNESIUM HYDROXIDE 400 MG/5ML PO SUSP
30.0000 mL | Freq: Every day | ORAL | Status: DC | PRN
  Administered 2011-11-25: 30 mL via ORAL
  Filled 2011-11-25: qty 30

## 2011-11-25 NOTE — Progress Notes (Signed)
DC'd foley catheter per MD order per hospital protocol. Emptied 1200 ml of urine. Urine has a strong odor. Patient tolerated well. Will continue to monitor patient closely and make sure patient voids within 6 hours. Bernita Raisin, RN

## 2011-11-25 NOTE — Progress Notes (Addendum)
Subjective: Sitting in bed, no complaints. She is in room air.  Objective: Vital signs in last 24 hours: Temp:  [98.1 F (36.7 C)-98.6 F (37 C)] 98.6 F (37 C) (12/13 0418) Pulse Rate:  [84-99] 84  (12/13 0418) Resp:  [18-20] 18  (12/13 0418) BP: (117-138)/(75-95) 120/80 mmHg (12/13 1021) SpO2:  [95 %-96 %] 96 % (12/13 0418) Weight:  [128.4 kg (283 lb 1.1 oz)] 283 lb 1.1 oz (128.4 kg) (12/13 0418) Weight change: -11.5 kg (-25 lb 5.7 oz) Last BM Date: 11/23/11  Intake/Output from previous day: 12/12 0701 - 12/13 0700 In: 420 [P.O.:420] Out: 3900 [Urine:3900] Intake/Output this shift:    General appearance: alert, cooperative, appears stated age and no distress Resp: Coarse but otherwise clear to auscultation bilaterally, on room air without tachypnea or labored respiratory effort Cardio: regular rate and rhythm, S1, S2 normal, no murmur, click, rub or gallop GI: Obese, soft, non-tender; bowel sounds normal; no masses,  no organomegaly Extremities: extremities normal, atraumatic, no cyanosis or edema Neurologic: Alert and oriented X 3, normal strength and tone.   Lab Results:  [facility] 11/25/11 0603 11/23/11 0948  WBC 15.5* 9.8  HGB 12.6 12.5  HCT 38.9 38.1  PLT 318 264   BMET  Basename 11/25/11 0603 11/24/11 0500  NA 135 133*  K 3.7 4.2  CL 94* 93*  CO2 30 29  GLUCOSE 169* 296*  BUN 16 13  CREATININE 1.00 0.86  CALCIUM 9.4 9.5    Studies/Results: No results found.  Medications:  I have reviewed the patient's current medications. Scheduled:    . amLODipine  10 mg Oral Daily  . aspirin  325 mg Oral Daily  . citalopram  20 mg Oral Daily  . enoxaparin  65 mg Subcutaneous Q24H  . furosemide  80 mg Intravenous Q12H   Followed by  . furosemide  40 mg Intravenous Q12H  . hydrALAZINE  10 mg Oral Q8H  . insulin aspart  0-20 Units Subcutaneous TID WC  . insulin aspart  0-5 Units Subcutaneous QHS  . insulin aspart  6 Units Subcutaneous TID WC  . insulin  aspart protamine-insulin aspart  25 Units Subcutaneous BID WC  . isosorbide mononitrate  30 mg Oral Daily  . metoprolol  100 mg Oral BID  . omega-3 acid ethyl esters  1 g Oral Daily  . ondansetron  4 mg Oral Once  . pantoprazole  40 mg Oral Daily  . potassium chloride  20 mEq Oral Daily  . sodium chloride  3 mL Intravenous Q12H  . DISCONTD: antiseptic oral rinse  15 mL Mouth Rinse BID  . DISCONTD: insulin aspart protamine-insulin aspart  125 Units Subcutaneous BID WC    Assessment/Plan:  Principal Problem:  Acute respiratory failure with hypoxia/*Acute on chronic diastolic heart failure: Clinically improved. Patient does have severe LVH with preserved LVEF of 58% from a October 2011. Patient is on IV Lasix we'll continue on that she has very good diuresis. Now she is on her room air probably she'll be appropriate for discharge in the morning back on her home dose of Lasix.  Active Problems:  ESSENTIAL HYPERTENSION Maximum blood pressure since admission was 146/96. We will continue amlodipine,  Imdur, and metoprolol. We will add hydralazine to aid in treatment of her underlying diastolic dysfunction.   Diabetes mellitus Home NovoLog mix 70/30  dose clarified. CBGs have been persistently greater than 250 since admission. We will increase her sliding scale insulin to resistant scale. to increase her insulin  dosage to 30 units.   OSA (obstructive sleep apnea) She is to normally use CPAP at home at hour of sleep. She has been noncompliant with this secondary to mask intolerance. She has not utilized CPAP since admission.    Obesity Chronic problem. Nothing new to add.   Hyperlipidemia Continue omega-3 fatty acids as per home regimen.  Disposition: Respiratory status is stable after adequate diuresis. She is appropriate to transfer to the telemetry unit.   LOS: 2 days   Tranika Scholler A  11/26/2011, 7:37 AM

## 2011-11-26 MED ORDER — ONDANSETRON HCL 8 MG PO TABS
8.0000 mg | ORAL_TABLET | Freq: Once | ORAL | Status: DC
  Filled 2011-11-26: qty 1

## 2011-11-26 MED ORDER — ONDANSETRON HCL 4 MG/2ML IJ SOLN: 4.0000 mg | Freq: Four times a day (QID) | INTRAMUSCULAR | Status: DC | PRN

## 2011-11-26 NOTE — Progress Notes (Signed)
   CARE MANAGEMENT NOTE 11/26/2011  Patient:  KEYSHIA, ORWICK   Account Number:  [id number]  Date Initiated:  11/25/2011  Documentation initiated by:  Donn Pierini  Subjective/Objective Assessment:   Pt admitted with CHF/resp fail     Action/Plan:   PTA pt lived at home with children, was independent with ADLs   Anticipated DC Date:  11/26/2011   Anticipated DC Plan:  HOME/SELF CARE      DC Planning Services  CM consult      Choice offered to / List presented to:             Status of service:  Completed, signed off Medicare Important Message given?   (If response is "NO", the following Medicare IM given date fields will be blank) Date Medicare IM given:   Date Additional Medicare IM given:    Discharge Disposition:  HOME/SELF CARE  Per UR Regulation:    Comments:  PCP- A. Wilson  11/26/11 16:22 Letha Cape RN, BSN [phone] Patient dc to home.

## 2011-11-26 NOTE — Discharge Summary (Signed)
HOSPITAL DISCHARGE SUMMARY  MKAYLA STEELE  MRN: [id number]  DOB:14-May-1966  Date of Admission: 11/23/2011 Date of Discharge: 11/26/2011         LOS: 3 days   Attending Physician:Eliud Polo A  Patient's JXB:JYNWGN,FAOZHY, MD, MD  Consults:  None  Discharge Diagnoses: Present on Admission:  .Acute on chronic diastolic heart failure .ESSENTIAL HYPERTENSION .Diastolic CHF, chronic .OSA (obstructive sleep apnea) .Diabetes mellitus .Obesity .Hyperlipidemia   Discharge Medication List as of 11/26/2011  2:04 PM    CONTINUE these medications which have NOT CHANGED   Details  albuterol (VENTOLIN HFA) 108 (90 BASE) MCG/ACT inhaler Inhale 2 puffs into the lungs every 6 (six) hours as needed.  , Until Discontinued, Historical Med    amLODipine (NORVASC) 5 MG tablet Take 2 tablets (10 mg total) by mouth daily., Starting 09/03/2011, Until Sat 09/02/12, Normal    aspirin 325 MG tablet Take 325 mg by mouth daily.  , Until Discontinued, Historical Med    citalopram (CELEXA) 20 MG tablet Take 20 mg by mouth daily.  , Until Discontinued, Historical Med    furosemide (LASIX) 40 MG tablet TAKE  80 MG IN THE AM AND TAKE 40 IN THE PM, Normal    insulin aspart protamine-insulin aspart (NOVOLOG 70/30) (70-30) 100 UNIT/ML injection Inject 15 Units into the skin 2 (two) times daily with a meal.  , Until Discontinued, Historical Med    metFORMIN (GLUMETZA) 1000 MG (MOD) 24 hr tablet Take 1,000 mg by mouth 2 (two) times daily with a meal.  , Until Discontinued, Historical Med    metoprolol (TOPROL-XL) 100 MG 24 hr tablet Take 100 mg by mouth 2 (two) times daily.  , Until Discontinued, Historical Med    Omega-3 Fatty Acids (FISH OIL) 1000 MG CAPS Take 2 capsules (2,000 mg total) by mouth daily., Starting 09/03/2011, Until Discontinued, Normal    pantoprazole (PROTONIX) 40 MG tablet Take 40 mg by mouth daily.  , Until Discontinued, Historical Med    potassium chloride (KLOR-CON) 10 MEQ CR tablet  Take 2 tablets (20 mEq total) by mouth daily., Starting 09/03/2011, Until Discontinued, Normal          Brief Admission History: 45 y.o. female with a PMHx of DM, NICM, HTN, HLD, OSA on CPAP with frequent hospitalization for diastolic CHF exacerbation came to the ED with shortness of breath for 3 days. She was in her usual state of health before this and was taking her medications regularly. She started having progressive SOB with orthopnea . Patient feels her symptoms are similar to her previous to her CHF exacerbation. Patient also c/o cough with runny nose since last evening. She has been having increase leg swellings for past few day and also feels abdominal fullness. denies chest pain, palpitations, headache, dizziness, blurring of vision , abdominal pain, bowel or urinary symptoms. She is also having cough mostly on lying flat with whitish productive sputum. She denies fever or chills. She denies increased salt intake. She has OSA but uses CPAP only occasionally because of discomfort she follows with Dr Marcelyn Bruins from pulmonary who recently adjusted her CPAP to 7 at night.. Patient had a CXR done in ED showing pulmonary congestion and was given 40 mg IV lasix and placed on BiPAP. On my evaluation she informs feeling better and maintaining sats on BiPAP.  Hospital Course: Present on Admission:  .Acute on chronic diastolic heart failure .ESSENTIAL HYPERTENSION .Diastolic CHF, chronic .OSA (obstructive sleep apnea) .Diabetes mellitus .Obesity .Hyperlipidemia:  1. Acute respiratory  failure: At the time of admission patient required BiPAP to keep her saturation up. It did help also with the congestive heart failure. Patient was placed in the step down unit after the diuresis initiated patient felt better and was take off of the BiPAP and replaced with nasal cannula. Patient soon did not continue that also.   2. Acute on chronic diastolic CHF: Patient admitted to the hospital for further  evaluation. Patient apparently did have acute on chronic diastolic CHF exacerbation obvious from the orthopnea and trunk swelling. She had minimal lower extremity swelling. Patient placed on BiPAP at the time of admission, and she was in step down unit. After admission aggressive diuresis with IV list Lasix was instituted. 80 mg of Lasix twice a day was started. Patient has very good fluid intake. According to the records from the hospital chart the last day she had a total of -13 L being diuresed over the past 4 days. Patient feeling much better she was in room air, has no shortness of breath no chest pain so patient was felt safe to be discharged. Initially consideration to consult Dr. Jearld Pies if patient was not getting better but the patient did very well with good diuresis and she was discharged home in the same regimen she was taking before. Patient instructed to call to see Dr. Shirlee Latch as soon as possible. Patient did have old echocardiogram so it was not repeated which showed ejection fraction about 58% with severe LVH suggesting impaired ventricular filling and diastolic dysfunction. Consideration for BiDil or ACE inhibitors was made in the hospital but we'll let her cardiologist decide about it. Patient is on Lasix, Toprol-XL.  3. Obstructive sleep apnea. Patient instructed to wear her CPAP at nighttime.  4. DM 2: Patient is on metformin and the carbohydrate modified diet. ACE inhibitor as was on not started on this patient because of a listed allergy. I will leave this up to her cardiologist to decide about either adding BiDil or ARB.  5. Essential hypertension: This is being controlled.  Day of Discharge BP 125/80  Pulse 79  Temp(Src) 97.9 F (36.6 C) (Oral)  Resp 18  Ht 5\' 9"  (1.753 m)  Wt 129.1 kg (284 lb 9.8 oz)  BMI 42.03 kg/m2  SpO2 97%  LMP 11/16/2011 Physical Exam: GEN: No acute distress, cooperative with exam PSYCH: He is alert and oriented x4; does not appear anxious does not  appear depressed; affect is normal  HEENT: Mucous membranes pink and anicteric;  Mouth: without oral thrush or lesions Eyes: PERRLA; EOM intact;  Neck: no cervical lymphadenopathy nor thyromegaly or carotid bruit; no JVD;  CHEST WALL: No tenderness, symmetrical to breathing bilaterally CHEST: Normal respiration, clear to auscultation bilaterally  HEART: Regular rate and rhythm; no murmurs, rubs or gallops, S1 and S2 heard  BACK: No kyphosis or scoliosis; no CVA tenderness  ABDOMEN:  soft non-tender; no masses, no organomegaly, normal abdominal bowel sounds; no pannus; no intertriginous candida.  EXTREMITIES: No bone or joint deformity; no edema; no ulcerations.  PULSES: 2+ and symmetric, neurovascularity is intact SKIN: Normal hydration no rash or ulceration, no flushing or suspicious lesions  CNS: Cranial nerves 2-12 grossly intact no focal neurologic deficit, coordination is intact gait not tested    Results for orders placed during the hospital encounter of 11/23/11 (from the past 24 hour(s))  GLUCOSE, CAPILLARY     Status: Abnormal   Collection Time   11/25/11  4:37 PM      Component Value  Range   Glucose-Capillary 164 (*) 70 - 99 (mg/dL)  GLUCOSE, CAPILLARY     Status: Abnormal   Collection Time   11/25/11  9:18 PM      Component Value Range   Glucose-Capillary 138 (*) 70 - 99 (mg/dL)  GLUCOSE, CAPILLARY     Status: Abnormal   Collection Time   11/26/11  6:39 AM      Component Value Range   Glucose-Capillary 142 (*) 70 - 99 (mg/dL)  GLUCOSE, CAPILLARY     Status: Abnormal   Collection Time   11/26/11 11:08 AM      Component Value Range   Glucose-Capillary 141 (*) 70 - 99 (mg/dL)   Comment 1 Documented in Chart     Comment 2 Notify RN      Disposition:  Home   Follow-up Appts: Discharge Orders    Future Appointments: Provider: Department: Dept Phone: Center:   11/30/2011 11:20 AM Ranelle Oyster, MD [facility] [phone] [facility]   12/22/2011 8:30 AM Melissa  [facility]  None   12/22/2011 10:00 AM Lauren Gilford Raid, RN [facility] [phone] [facility]   12/23/2011 8:30 AM Marca Ancona, MD [facility] [phone] [facility]     Future Orders Please Complete By Expires   Diet - low sodium heart healthy      Diet Carb Modified      Increase activity slowly         Follow-up Information    Follow up with [facility], MD. Make an appointment in 2 weeks.      Follow up with Marca Ancona, MD. Make an appointment in 1 week.   Contact information:   [address] [place] [address] [phone]          I spent 40 minutes completing paperwork and coordinating discharge efforts.  SignedClydia Llano A 11/26/2011, 3:55 PM

## 2011-11-26 NOTE — Progress Notes (Deleted)
HOSPITAL DISCHARGE SUMMARY  [name]  MRN: [id number]  DOB:22-Feb-1966  Date of Admission: 11/23/2011 Date of Discharge: 11/26/2011         LOS: 3 days   Attending Physician:Giamarie Bueche A  Patient's WUJ:WJXBJY,NWGNFA, MD, MD  Consults:  None  Discharge Diagnoses: Present on Admission:  .Acute on chronic diastolic heart failure .ESSENTIAL HYPERTENSION .Diastolic CHF, chronic .OSA (obstructive sleep apnea) .Diabetes mellitus .Obesity .Hyperlipidemia   Current Discharge Medication List    CONTINUE these medications which have NOT CHANGED   Details  albuterol (VENTOLIN HFA) 108 (90 BASE) MCG/ACT inhaler Inhale 2 puffs into the lungs every 6 (six) hours as needed.      amLODipine (NORVASC) 5 MG tablet Take 2 tablets (10 mg total) by mouth daily. Qty: 60 tablet, Refills: 11   Associated Diagnoses: Chronic diastolic heart failure; Unspecified essential hypertension    aspirin 325 MG tablet Take 325 mg by mouth daily.      citalopram (CELEXA) 20 MG tablet Take 20 mg by mouth daily.      furosemide (LASIX) 40 MG tablet TAKE  80 MG IN THE AM AND TAKE 40 IN THE PM Qty: 60 tablet, Refills: 11    insulin aspart protamine-insulin aspart (NOVOLOG 70/30) (70-30) 100 UNIT/ML injection Inject 15 Units into the skin 2 (two) times daily with a meal.      metFORMIN (GLUMETZA) 1000 MG (MOD) 24 hr tablet Take 1,000 mg by mouth 2 (two) times daily with a meal.      metoprolol (TOPROL-XL) 100 MG 24 hr tablet Take 100 mg by mouth 2 (two) times daily.      Omega-3 Fatty Acids (FISH OIL) 1000 MG CAPS Take 2 capsules (2,000 mg total) by mouth daily. Qty: 60 capsule, Refills: 3    pantoprazole (PROTONIX) 40 MG tablet Take 40 mg by mouth daily.      potassium chloride (KLOR-CON) 10 MEQ CR tablet Take 2 tablets (20 mEq total) by mouth daily. Qty: 60 tablet, Refills: 11          Brief Admission History: 45 y.o. female with a PMHx of DM, NICM, HTN, HLD, OSA on CPAP with frequent  hospitalization for diastolic CHF exacerbation came to the ED with shortness of breath for 3 days. She was in her usual state of health before this and was taking her medications regularly. She started having progressive SOB with orthopnea . Patient feels her symptoms are similar to her previous to her CHF exacerbation. Patient also c/o cough with runny nose since last evening. She has been having increase leg swellings for past few day and also feels abdominal fullness. denies chest pain, palpitations, headache, dizziness, blurring of vision , abdominal pain, bowel or urinary symptoms. She is also having cough mostly on lying flat with whitish productive sputum. She denies fever or chills. She denies increased salt intake. She has OSA but uses CPAP only occasionally because of discomfort she follows with Dr Marcelyn Bruins from pulmonary who recently adjusted her CPAP to 7 at night.. Patient had a CXR done in ED showing pulmonary congestion and was given 40 mg IV lasix and placed on BiPAP. On my evaluation she informs feeling better and maintaining sats on BiPAP.  Hospital Course: Present on Admission:  .Acute on chronic diastolic heart failure .ESSENTIAL HYPERTENSION .Diastolic CHF, chronic .OSA (obstructive sleep apnea) .Diabetes mellitus .Obesity .Hyperlipidemia:  1. Acute respiratory failure: At the time of admission patient required BiPAP to keep her saturation up. It did help also with  the congestive heart failure. Patient was placed in the step down unit after the diuresis initiated patient felt better and was take off of the BiPAP and replaced with nasal cannula. Patient soon did not continue that also.   2. Acute on chronic diastolic CHF: Patient admitted to the hospital for further evaluation. Patient apparently did have acute on chronic diastolic CHF exacerbation obvious from the orthopnea and trunk swelling. She had minimal lower extremity swelling. Patient placed on BiPAP at the time of  admission, and she was in step down unit. After admission aggressive diuresis with IV list Lasix was instituted. 80 mg of Lasix twice a day was started. Patient has very good fluid intake. According to the records from the hospital chart the last day she had a total of -13 L being diuresed over the past 4 days. Patient feeling much better she was in room air, has no shortness of breath no chest pain so patient was felt safe to be discharged. Initially consideration to consult Dr. Jearld Pies if patient was not getting better but the patient did very well with good diuresis and she was discharged home in the same regimen she was taking before. Patient instructed to call to see Dr. Shirlee Latch as soon as possible. Patient did have old echocardiogram so it was not repeated which showed ejection fraction about 58% with severe LVH suggesting impaired ventricular filling and diastolic dysfunction. Consideration for BiDil or ACE inhibitors was made in the hospital but we'll let her cardiologist decide about it. Patient is on Lasix, Toprol-XL.  3. Obstructive sleep apnea. Patient instructed to wear her CPAP at nighttime.  4. DM 2: Patient is on metformin and the carbohydrate modified diet. ACE inhibitor as was on not started on this patient because of a listed allergy. I will leave this up to her cardiologist to decide about either adding BiDil or ARB.  5. Essential hypertension: This is being controlled.  Day of Discharge BP 125/80  Pulse 79  Temp(Src) 97.9 F (36.6 C) (Oral)  Resp 18  Ht 5\' 9"  (1.753 m)  Wt 129.1 kg (284 lb 9.8 oz)  BMI 42.03 kg/m2  SpO2 97%  LMP 11/16/2011 Physical Exam: GEN: No acute distress, cooperative with exam PSYCH: He is alert and oriented x4; does not appear anxious does not appear depressed; affect is normal  HEENT: Mucous membranes pink and anicteric;  Mouth: without oral thrush or lesions Eyes: PERRLA; EOM intact;  Neck: no cervical lymphadenopathy nor thyromegaly or carotid  bruit; no JVD;  CHEST WALL: No tenderness, symmetrical to breathing bilaterally CHEST: Normal respiration, clear to auscultation bilaterally  HEART: Regular rate and rhythm; no murmurs, rubs or gallops, S1 and S2 heard  BACK: No kyphosis or scoliosis; no CVA tenderness  ABDOMEN:  soft non-tender; no masses, no organomegaly, normal abdominal bowel sounds; no pannus; no intertriginous candida.  EXTREMITIES: No bone or joint deformity; no edema; no ulcerations.  PULSES: 2+ and symmetric, neurovascularity is intact SKIN: Normal hydration no rash or ulceration, no flushing or suspicious lesions  CNS: Cranial nerves 2-12 grossly intact no focal neurologic deficit, coordination is intact gait not tested    Results for orders placed during the hospital encounter of 11/23/11 (from the past 24 hour(s))  GLUCOSE, CAPILLARY     Status: Abnormal   Collection Time   11/25/11  4:37 PM      Component Value Range   Glucose-Capillary 164 (*) 70 - 99 (mg/dL)  GLUCOSE, CAPILLARY     Status: Abnormal  Collection Time   11/25/11  9:18 PM      Component Value Range   Glucose-Capillary 138 (*) 70 - 99 (mg/dL)  GLUCOSE, CAPILLARY     Status: Abnormal   Collection Time   11/26/11  6:39 AM      Component Value Range   Glucose-Capillary 142 (*) 70 - 99 (mg/dL)  GLUCOSE, CAPILLARY     Status: Abnormal   Collection Time   11/26/11 11:08 AM      Component Value Range   Glucose-Capillary 141 (*) 70 - 99 (mg/dL)   Comment 1 Documented in Chart     Comment 2 Notify RN      Disposition:  Home   Follow-up Appts: Discharge Orders    Future Appointments: Provider: Department: Dept Phone: Center:   11/30/2011 11:20 AM Ranelle Oyster, MD [facility] [phone] [facility]   12/22/2011 8:30 AM Melissa C. Al-Rammal [facility]  None   12/22/2011 10:00 AM Lauren Gilford Raid, RN [facility] [phone] [facility]   12/23/2011 8:30 AM Marca Ancona, MD [facility] [phone] [facility]      Future Orders Please Complete By Expires   Diet - low sodium heart healthy      Diet Carb Modified      Increase activity slowly         Follow-up Information    Follow up with [facility], MD. Make an appointment in 2 weeks.      Follow up with Marca Ancona, MD. Make an appointment in 1 week.   Contact information:   [address] [place] [address] [phone]          I spent 40 minutes completing paperwork and coordinating discharge efforts.  SignedClydia Llano A 11/26/2011, 1:34 PM

## 2011-11-30 ENCOUNTER — Encounter: Payer: Medicaid Other | Attending: Physical Medicine & Rehabilitation | Admitting: Physical Medicine & Rehabilitation

## 2011-12-21 ENCOUNTER — Ambulatory Visit (INDEPENDENT_AMBULATORY_CARE_PROVIDER_SITE_OTHER): Payer: Self-pay

## 2011-12-21 ENCOUNTER — Other Ambulatory Visit: Payer: Self-pay | Admitting: *Deleted

## 2011-12-21 VITALS — BP 152/104 | HR 106 | Resp 18 | Ht 69.0 in | Wt 289.0 lb

## 2011-12-21 DIAGNOSIS — R002 Palpitations: Secondary | ICD-10-CM

## 2011-12-21 MED ORDER — METOPROLOL SUCCINATE ER 100 MG PO TB24: ORAL_TABLET | ORAL | Status: DC

## 2011-12-21 NOTE — Patient Instructions (Signed)
Your physician has recommended you make the following change in your medication: increase Metoprolol 100 mg to 1 and 1/2 tablet in the AM and 1 tablet in the evening.  Blood pressure today ,152/104 Your physician recommends that you schedule a follow-up appointment in:  Pt. To keep the appointment with Dr. Shirlee Latch on 12/23/11 at 8:30 AM.

## 2011-12-21 NOTE — Progress Notes (Signed)
Patient in for an EKG for C/O of palpitations. B/P 152/104 right arm, Sinus tachycardia  106 beats/min. Tereso Newcomer PA  Recommended for pt. To increased Metoprolol 100 mg to 1 and 1/2 tablet in AM and 1 tablet in PM. Pt to keep appointment with Dr. Shirlee Latch on 12-24-11. Patient aware, instructions given.

## 2011-12-22 ENCOUNTER — Encounter: Payer: Self-pay | Admitting: Cardiology

## 2011-12-23 ENCOUNTER — Ambulatory Visit: Payer: Self-pay | Admitting: Cardiology

## 2011-12-24 ENCOUNTER — Encounter: Payer: Self-pay | Admitting: Cardiology

## 2011-12-24 ENCOUNTER — Ambulatory Visit: Payer: Self-pay | Admitting: Cardiology

## 2011-12-24 DIAGNOSIS — R0989 Other specified symptoms and signs involving the circulatory and respiratory systems: Secondary | ICD-10-CM

## 2012-01-11 ENCOUNTER — Encounter: Payer: Medicaid Other | Attending: Physical Medicine & Rehabilitation | Admitting: Physical Medicine & Rehabilitation

## 2012-01-17 ENCOUNTER — Encounter: Payer: Medicaid Other | Attending: Physical Medicine & Rehabilitation | Admitting: Physical Medicine & Rehabilitation

## 2012-01-17 DIAGNOSIS — M719 Bursopathy, unspecified: Secondary | ICD-10-CM | POA: Insufficient documentation

## 2012-01-17 DIAGNOSIS — E1149 Type 2 diabetes mellitus with other diabetic neurological complication: Secondary | ICD-10-CM | POA: Insufficient documentation

## 2012-01-17 DIAGNOSIS — E669 Obesity, unspecified: Secondary | ICD-10-CM

## 2012-01-17 DIAGNOSIS — M5137 Other intervertebral disc degeneration, lumbosacral region: Secondary | ICD-10-CM

## 2012-01-17 DIAGNOSIS — I428 Other cardiomyopathies: Secondary | ICD-10-CM | POA: Insufficient documentation

## 2012-01-17 DIAGNOSIS — R609 Edema, unspecified: Secondary | ICD-10-CM | POA: Insufficient documentation

## 2012-01-17 DIAGNOSIS — M67919 Unspecified disorder of synovium and tendon, unspecified shoulder: Secondary | ICD-10-CM | POA: Insufficient documentation

## 2012-01-17 DIAGNOSIS — E1142 Type 2 diabetes mellitus with diabetic polyneuropathy: Secondary | ICD-10-CM

## 2012-01-17 DIAGNOSIS — M1710 Unilateral primary osteoarthritis, unspecified knee: Secondary | ICD-10-CM

## 2012-01-17 DIAGNOSIS — I509 Heart failure, unspecified: Secondary | ICD-10-CM | POA: Insufficient documentation

## 2012-01-17 DIAGNOSIS — G8929 Other chronic pain: Secondary | ICD-10-CM | POA: Insufficient documentation

## 2012-01-17 NOTE — Assessment & Plan Note (Signed)
[name] is back regarding her multiple pain issues.  She missed her last appointment and has had some issues related to congestive heart failure and edema.  She is having a kidney workup apparently as well. Hydrocodone helps her pain and Robaxin was effective for spasms.  She is still having shoulder symptoms as well as tingling and numbness in the hands and feet.  Pain is about a 5-7/10 on average.  She reports some shoulder tightness as well.  REVIEW OF SYSTEMS:  Notable for the above.  She is trying to exercise a bit.  Sugars have been out of control.  She does report depression and anxiety.  Full 12-point review is in the written health and history section of the chart.  She does has a CPAP machine, but does not like how it feels and feels smothered.  SOCIAL HISTORY:  The patient is supported by her family.  She does report that both her son and daughter-in-law lost jobs unfortunately recently.  PHYSICAL EXAMINATION:  VITAL SIGNS:  Blood pressure 155/99, pulse 92, respiratory rate 18 and she is satting 100% on room air. GENERAL:  The patient is pleasant and alert.  She is still obese.  She had diminished sensation distally in all 4 limbs to pinprick and light touch.  She has a slightly wide-based gait.  There is a knot over the lower trap into the upper rhomboids, although I am not entirely convinced this is muscular.  Posture is notable for head forward position.  Sitting position is fair today.  She has edema of both legs at 1 to 2+.  There was more edema around the left knee. HEART:  Regular. CHEST:  Clear. ABDOMEN:  Soft and nontender. NEUROLOGIC:  Cognitively, she is appropriate and she is pleasant as always.  ASSESSMENT: 1. Chronic low back pain which is multifactorial.  Recent MRI of the     back is notable for disc disease and facet hypertrophy.  Most     involved level appeared to be L4-L5, where there is a shallow broad-     based disk herniation and facet  arthropathy with mild stenosis. 2. Diabetic peripheral neuropathy. 3. Bilateral rotator cuff syndrome. 4. Morbid obesity. 5. Cardiomyopathy.  PLAN: 1. Again limited due to finances and medical history.  I would like to     introduce Tegretol 100 mg b.i.d. for neuropathic symptoms. 2. I refilled her hydrocodone #75, 5/325. 3. Encouraged activity to tolerance. 4. Discussed appropriate diabetes control.  She needs to seek some     more medical guidance for this as well. 5. Cardiac workup per Dr. Shirlee Latch. 6. We will hold on any intervention regarding back at this point.  The     patient is not anxious to pursue any     injections either at this time. 7. I will see her back in 3 months' time.  She will call me with any     problems or questions.     Ranelle Oyster, M.D. Electronically Signed    ZTS/MedQ D:  01/17/2012 11:20:56  T:  01/17/2012 17:54:42  Job #:  [id number]  cc:   Caitlyn Ancona, MD [address] [place] [address]  Caitlyn Skeans, MD Fax: [phone]

## 2012-01-25 ENCOUNTER — Telehealth: Payer: Self-pay | Admitting: Cardiology

## 2012-01-25 NOTE — Telephone Encounter (Signed)
I talked with pt. Pt states she has started retaining fluid starting last week. She states she weighed 267 last week and today she weighs 298. She states she has doubled her fluid pill the last few days. She states she may have lost a few pounds but she still feels very full of fluid. She is using CPAP and is  SOB at rest.  I offered pt an appt tomorrow but she states she feels she should go to ED today. Pt to report to ED at [facility].

## 2012-01-25 NOTE — Telephone Encounter (Signed)
Pt is in a lot of pain with her fluid build up and she needs to know what to do

## 2012-01-25 NOTE — Telephone Encounter (Signed)
Will forward to Dr. McLean's nurse. 

## 2012-01-31 ENCOUNTER — Emergency Department (HOSPITAL_COMMUNITY): Payer: Medicaid Other

## 2012-01-31 ENCOUNTER — Encounter (HOSPITAL_COMMUNITY): Payer: Self-pay | Admitting: Emergency Medicine

## 2012-01-31 ENCOUNTER — Other Ambulatory Visit: Payer: Self-pay

## 2012-01-31 ENCOUNTER — Emergency Department (HOSPITAL_COMMUNITY)
Admission: EM | Admit: 2012-01-31 | Discharge: 2012-01-31 | Disposition: A | Discharge status: Home / Self Care | Payer: Medicaid Other | Attending: Emergency Medicine | Admitting: Emergency Medicine

## 2012-01-31 DIAGNOSIS — I10 Essential (primary) hypertension: Secondary | ICD-10-CM | POA: Insufficient documentation

## 2012-01-31 DIAGNOSIS — E1149 Type 2 diabetes mellitus with other diabetic neurological complication: Secondary | ICD-10-CM | POA: Insufficient documentation

## 2012-01-31 DIAGNOSIS — M51379 Other intervertebral disc degeneration, lumbosacral region without mention of lumbar back pain or lower extremity pain: Secondary | ICD-10-CM | POA: Insufficient documentation

## 2012-01-31 DIAGNOSIS — R079 Chest pain, unspecified: Secondary | ICD-10-CM | POA: Insufficient documentation

## 2012-01-31 DIAGNOSIS — M1610 Unilateral primary osteoarthritis, unspecified hip: Secondary | ICD-10-CM | POA: Insufficient documentation

## 2012-01-31 DIAGNOSIS — M169 Osteoarthritis of hip, unspecified: Secondary | ICD-10-CM | POA: Insufficient documentation

## 2012-01-31 DIAGNOSIS — I509 Heart failure, unspecified: Secondary | ICD-10-CM

## 2012-01-31 DIAGNOSIS — Z794 Long term (current) use of insulin: Secondary | ICD-10-CM | POA: Insufficient documentation

## 2012-01-31 DIAGNOSIS — E1142 Type 2 diabetes mellitus with diabetic polyneuropathy: Secondary | ICD-10-CM | POA: Insufficient documentation

## 2012-01-31 DIAGNOSIS — Z79899 Other long term (current) drug therapy: Secondary | ICD-10-CM | POA: Insufficient documentation

## 2012-01-31 DIAGNOSIS — E785 Hyperlipidemia, unspecified: Secondary | ICD-10-CM | POA: Insufficient documentation

## 2012-01-31 DIAGNOSIS — M5137 Other intervertebral disc degeneration, lumbosacral region: Secondary | ICD-10-CM | POA: Insufficient documentation

## 2012-01-31 DIAGNOSIS — I428 Other cardiomyopathies: Secondary | ICD-10-CM | POA: Insufficient documentation

## 2012-01-31 DIAGNOSIS — Z7982 Long term (current) use of aspirin: Secondary | ICD-10-CM | POA: Insufficient documentation

## 2012-01-31 LAB — DIFFERENTIAL
Basophils Relative: 1 % (ref 0–1)
Lymphs Abs: 3.4 10*3/uL (ref 0.7–4.0)
Monocytes Relative: 8 % (ref 3–12)
Neutro Abs: 4.6 10*3/uL (ref 1.7–7.7)
Neutrophils Relative %: 52 % (ref 43–77)

## 2012-01-31 LAB — CBC
Hemoglobin: 12.5 g/dL (ref 12.0–15.0)
MCHC: 32.5 g/dL (ref 30.0–36.0)
RBC: 4.56 MIL/uL (ref 3.87–5.11)

## 2012-01-31 LAB — COMPREHENSIVE METABOLIC PANEL
ALT: 15 U/L (ref 0–35)
Albumin: 3.7 g/dL (ref 3.5–5.2)
Alkaline Phosphatase: 78 U/L (ref 39–117)
BUN: 8 mg/dL (ref 6–23)
Chloride: 98 mEq/L (ref 96–112)
Potassium: 3.9 mEq/L (ref 3.5–5.1)
Total Bilirubin: 0.6 mg/dL (ref 0.3–1.2)

## 2012-01-31 LAB — TROPONIN I: Troponin I: <0.3 ng/mL (ref <0.30)

## 2012-01-31 MED ORDER — FUROSEMIDE 10 MG/ML IJ SOLN
80.0000 mg | Freq: Once | INTRAMUSCULAR | Status: AC
  Administered 2012-01-31: 80 mg via INTRAVENOUS
  Filled 2012-01-31: qty 8

## 2012-01-31 NOTE — ED Notes (Signed)
EDP at [facility], discussing with pt plan of care & d/c, pt "wants to go home, feels OK to go home", states, "feels better & same as usual, breathing is same as usual", alert, NAD, calm, interactive, skin W&D, resps e/u, speaking in clear complete sentences.

## 2012-01-31 NOTE — ED Notes (Addendum)
Pt received to [facility] with c/o chest discomfort onset 1 week ago with nausea, dizziness, swelling noted to BLE. Pt is attached to cardiac monitor. Pt is NAD

## 2012-01-31 NOTE — ED Notes (Signed)
MD at bedside. 

## 2012-01-31 NOTE — ED Notes (Addendum)
Pt c/o left chest pain for approx 4 days St's she has had swelling in legs for over 1 week.  Pt c/o aching all over.

## 2012-01-31 NOTE — Discharge Instructions (Signed)
Increase your lasix to 120 mg twice daily.       Heart Failure Heart failure (HF) is a condition in which the heart has trouble pumping blood. This means your heart does not pump blood efficiently for your body to work well. In some cases of HF, fluid may back up into your lungs or you may have swelling (edema) in your lower legs. HF is a long-term (chronic) condition. It is important for you to take good care of yourself and follow your caregiver's treatment plan. CAUSES   Health conditions:   High blood pressure (hypertension) causes the heart muscle to work harder than normal. When pressure in the blood vessels is high, the heart needs to pump (contract) with more force in order to circulate blood throughout the body. High blood pressure eventually causes the heart to become stiff and weak.   Coronary artery disease (CAD) is the buildup of cholesterol and fat (plaques) in the arteries of the heart. The blockage in the arteries deprives the heart muscle of oxygen and blood. This can cause chest pain and may lead to a heart attack. High blood pressure can also contribute to CAD.   Heart attack (myocardial infarction) occurs when 1 or more arteries in the heart become blocked. The loss of oxygen damages the muscle tissue of the heart. When this happens, part of the heart muscle dies. The injured tissue does not contract as well and weakens the heart's ability to pump blood.   Abnormal heart valves can cause HF when the heart valves do not open and close properly. This makes the heart muscle pump harder to keep the blood flowing.   Heart muscle disease (cardiomyopathy or myocarditis) is damage to the heart muscle from a variety of causes. These can include drug or alcohol abuse, infections, or unknown reasons. These can increase the risk of HF.   Lung disease makes the heart work harder because the lungs do not work properly. This can cause a strain on the heart leading it to fail.   Diabetes  increases the risk of HF. High blood sugar contributes to high fat (lipid) levels in the blood. Diabetes can also cause slow damage to tiny blood vessels that carry important nutrients to the heart muscle. When the heart does not get enough oxygen and food, it can cause the heart to become weak and stiff. This leads to a heart that does not contract efficiently.   Other diseases can contribute to HF. These include abnormal heart rhythms, thyroid problems, and low blood counts (anemia).   Unhealthy lifestyle habits:   Obesity.   Smoking.   Eating foods high in fat and cholesterol.   Eating or drinking beverages high in salt.   Drug or alcohol abuse.   Lack of exercise.  SYMPTOMS  HF symptoms may vary and can be hard to detect. Symptoms may include:  Shortness of breath with activity, such as climbing stairs.   Persistent cough.   Swelling of the feet, ankles, legs, or abdomen.   Unexplained weight gain.   Difficulty breathing when lying flat.   Waking from sleep because of the need to sit up and get more air.   Rapid heartbeat.   Fatigue and loss of energy.   Feeling lightheaded or close to fainting.  DIAGNOSIS  A diagnosis of HF is based on your history, symptoms, physical examination, and diagnostic tests. Diagnostic tests for HF may include:  EKG.   Chest X-ray.   Blood tests.  Exercise stress test.   Blood oxygen test (arterial blood gas).   Evaluation by a heart doctor (cardiologist).   Ultrasound evaluation of the heart (echocardiogram).   Heart artery test to look for blockages (angiogram).   Radioactive imaging to look at the heart (radionuclide test).  TREATMENT  Treatment is aimed at managing the symptoms of HF. Medicines, lifestyle changes, or surgical intervention may be necessary to treat HF.  Medicines to help treat HF may include:   Angiotensin-converting enzyme (ACE) inhibitors. These block the effects of a blood protein called  angiotensin-converting enzyme. ACE inhibitors relax (dilate) the blood vessels and help lower blood pressure. This decreases the workload of the heart, slows the progression of HF, and improves symptoms.   Angiotensin receptor blockers (ARBs). These medications work similar to ACE inhibitors. ARBs may be an alternative for people who cannot tolerate an ACE inhibitor.   Aldosterone antagonists. This medication helps get rid of extra fluid from your body. This lowers the volume of blood the heart has to pump.   Water pills (diuretics). Diuretics cause the kidneys to remove salt and water from the blood. The extra fluid is removed by urination. By removing extra fluid from the body, diuretics help lower the workload of the heart and help prevent fluid buildup in the lungs so breathing is easier.   Beta blockers. These prevent the heart from beating too fast and improve heart muscle strength. Beta blockers help maintain a normal heart rate, control blood pressure, and improve HF symptoms.   Digitalis. This increases the force of the heartbeat and may be helpful to people with HF or heart rhythm problems.   Healthy lifestyle changes include:   Stopping smoking.   Eating a healthy diet. Avoid foods high in fat. Avoid foods fried in oil or made with fat. A dietician can help with healthy food choices.   Limiting how much salt you eat.   Limiting alcohol intake to no more than 1 drink per day for women and 2 drinks per day for men. Drinking more than that is harmful to your heart. If your heart has already been damaged by alcohol or you have severe HF, drinking alcohol should be stopped completely.   Exercising as directed by your caregiver.   Surgical treatment for HF may include:   Procedures to open blocked arteries, repair damaged heart valves, or remove damaged heart muscle tissue.   A pacemaker to help heart muscle function and to control certain abnormal heart rhythms.   A defibrillator  to possibly prevent sudden cardiac death.  HOME CARE INSTRUCTIONS   Activity level. Your caregiver can help you determine what type of exercise program may be helpful. It is important to maintain your strength. Pace your physical activity to avoid shortness of breath or chest pain. Rest for 1 hour before and after meals. A cardiac rehabilitation program may be helpful to some people with HF.   Diet. Eat a heart healthy diet. Food choices should be low in saturated fat and cholesterol. Talk to a dietician to learn about heart healthy foods.   Salt intake. When you have HF, you need to limit the amount of salt you eat. Eat less than 1500 milligrams (mg) of salt per day or as recommended by your caregiver.   Weight monitoring. Weigh yourself every day. You should weigh yourself in the morning after you urinate and before you eat breakfast. Wear the same amount of clothing each time you weigh yourself. Record your weight daily.  Bring your recorded weights to your clinic visits. Tell your caregiver right away if you have gained 3 lb/1.4 kg in 1 day, or 5 lb/2.3 kg in a week or whatever amount you were told to report.   Blood pressure monitoring. This should be done as directed by your caregiver. A home blood pressure cuff can be purchased at a drugstore. Record your blood pressure numbers and bring them to your clinic visits. Tell your caregiver if you become dizzy or lightheaded upon standing up.   Smoking. If you are currently a smoker, it is time to quit. Nicotine makes your heart work harder by causing your blood vessels to constrict. Do not use nicotine gum or patches before talking to your caregiver.   Follow up. Be sure to schedule a follow-up visit with your caregiver. Keep all your appointments.  SEEK MEDICAL CARE IF:   Your weight increases by 3 lb/1.4 kg in 1 day or 5 lb/2.3 kg in a week.   You notice increasing shortness of breath that is unusual for you. This may happen during rest,  sleep, or with activity.   You cough more than normal, especially with physical activity.   You notice more swelling in your hands, feet, ankles, or belly (abdomen).   You are unable to sleep because it is hard to breathe.   You cough up bloody mucus (sputum).   You begin to feel "jumping" or "fluttering" sensations (palpitations) in your chest.  SEEK IMMEDIATE MEDICAL CARE IF:   You have severe chest pain or pressure which may include symptoms such as:   Pain or pressure in the arms, neck, jaw, or back.   Feeling sweaty.   Feeling sick to your stomach (nauseous).   Feeling short of breath while at rest.   Having a fast or irregular heartbeat.   You experience stroke symptoms. These symptoms include:   Facial weakness or numbness.   Weakness or numbness in an arm, leg, or on one side of your body.   Blurred vision.   Difficulty talking or thinking.   Dizziness or fainting.   Severe headache.  THESE ARE MEDICAL EMERGENCIES. Do not wait to see if the symptoms go away. Call your local emergency services (911 in [place] DO NOT drive yourself to the hospital. IMPORTANT  Make a list of every medicine, vitamin, or herbal supplement you are taking. Keep the list with you at all times. Show it to your caregiver at every visit. Keep the list up-to-date.   Ask your caregiver or pharmacist to write an explanation of each medicine you are taking. This should include:   Why you are taking it.   The possible side effects.   The best time of day to take it.   Foods to take with it or what foods to avoid.   When to stop taking it.  MAKE SURE YOU:   Understand these instructions.   Will watch your condition.   Will get help right away if you are not doing well or get worse.  Document Released: 11/29/2005 Document Revised: 08/11/2011 Document Reviewed: 03/13/2010 [facility] Patient Information 2012 [place], [place].

## 2012-01-31 NOTE — ED Provider Notes (Signed)
History     CSN: [id number]  Arrival date & time 01/31/12  1524   First MD Initiated Contact with Patient 01/31/12 1745      Chief Complaint  Patient presents with  . Chest Pain    (Consider location/radiation/quality/duration/timing/severity/associated sxs/prior treatment) HPI Comments: Complains of ble swelling, some sob for the past 2 days.  Has history of chf, but normal cath in 6/12.  Has been here before for similar complaints.  Patient is a 46 y.o. female presenting with chest pain. The history is provided by the patient.  Chest Pain The chest pain began yesterday. Chest pain occurs constantly. The chest pain is worsening. The severity of the pain is mild. The quality of the pain is described as tightness. The pain does not radiate. Pertinent negatives for primary symptoms include no fever, no cough, no nausea and no vomiting.     Past Medical History  Diagnosis Date  . Hypertension   . Diabetes mellitus, type 2     HbA1C  11.1 8/12  . NICM (nonischemic cardiomyopathy)     EF 45-50% in 8/12, cath 6/12 showed normal coronaries  . Morbid obesity   . Allergic rhinitis   . HLD (hyperlipidemia)     LDL 110.9 in 9/12  . OSA (obstructive sleep apnea)     sleep study in 8/12 showed moderate to severe OSA requiring CPAP  . Chronic lower back pain     secondary to DJD, obseity, hip problems. Sees Dr. Ivory Broad  . 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 CHF (congestive heart failure)     multiple admission, last one in 8/12. Discharge weight  135 kg  . Degeneration of lumbar or lumbosacral intervertebral disc   . Polyneuropathy in diabetes   . Thoracic or lumbosacral neuritis or radiculitis, unspecified   . Calcifying tendinitis of shoulder     Past Surgical History  Procedure Date  . Breast biopsies   . Tubal ligation 05/31/1985  . Hand surgery     Left  . Shoulder surgery     right  . Dental surgery     tumor removed     Family History    Problem Relation Age of Onset  . Heart disease Mother   . Heart disease Paternal Grandmother   . Heart disease Paternal Grandfather   . Heart disease Father   . Stroke Paternal Grandfather     History  Substance Use Topics  . Smoking status: Never Smoker   . Smokeless tobacco: Not on file  . Alcohol Use: No    OB History    Grav Para Term Preterm Abortions TAB SAB Ect Mult Living                  Review of Systems  Constitutional: Negative for fever.  Respiratory: Negative for cough.   Cardiovascular: Positive for chest pain.  Gastrointestinal: Negative for nausea and vomiting.  All other systems reviewed and are negative.    Allergies  Clindamycin; Enalapril maleate; and Lisinopril  Home Medications   Current Outpatient Rx  Name Route Sig Dispense Refill  . ALBUTEROL SULFATE HFA 108 (90 BASE) MCG/ACT IN AERS Inhalation Inhale 2 puffs into the lungs every 6 (six) hours as needed. For wheezing    . AMLODIPINE BESYLATE 5 MG PO TABS Oral Take 5 mg by mouth 2 (two) times daily.    . ASPIRIN 325 MG PO TABS Oral Take 325 mg by  mouth daily.     [name] CITALOPRAM HYDROBROMIDE 20 MG PO TABS Oral Take 20 mg by mouth daily.     . FUROSEMIDE 40 MG PO TABS Oral Take 40 mg by mouth at bedtime. TAKE  80 MG IN THE AM AND TAKE 40 IN THE PM    . HYDROCODONE-ACETAMINOPHEN 5-325 MG PO TABS Oral Take 1 tablet by mouth every 6 (six) hours as needed. For pain     . INSULIN ASPART PROT & ASPART (70-30) 100 UNIT/ML Azure SUSP Subcutaneous Inject 15 Units into the skin 2 (two) times daily with a meal.     . METFORMIN HCL ER (MOD) 1000 MG PO TB24 Oral Take 1,000 mg by mouth 2 (two) times daily with a meal.     . METHOCARBAMOL 500 MG PO TABS Oral Take 500 mg by mouth every 6 (six) hours as needed. For muscle spasm    . METOPROLOL SUCCINATE ER 100 MG PO TB24 Oral Take 100 mg by mouth daily. Take 1 and 1/2 tablet in AM and 1 tablet in the Evening.    [name] FISH OIL 1000 MG PO CAPS Oral Take 2 capsules (2,000  mg total) by mouth daily. 60 capsule 3  . PANTOPRAZOLE SODIUM 40 MG PO TBEC Oral Take 40 mg by mouth daily.     [name] POTASSIUM CHLORIDE 10 MEQ PO TBCR Oral Take 10 mEq by mouth daily.      BP 141/102  Pulse 107  Temp(Src) 98.2 F (36.8 C) (Oral)  Resp 16  SpO2 97%  LMP 01/17/2012  Physical Exam  Nursing note and vitals reviewed. Constitutional: She is oriented to person, place, and time. She appears well-developed and well-nourished. No distress.  HENT:  Head: Normocephalic and atraumatic.  Neck: Normal range of motion. Neck supple.  Cardiovascular: Normal rate and regular rhythm.  Exam reveals no gallop and no friction rub.   No murmur heard. Pulmonary/Chest: Effort normal and breath sounds normal. No respiratory distress. She has no wheezes.  Abdominal: Soft. Bowel sounds are normal. She exhibits no distension. There is no tenderness.  Musculoskeletal: Normal range of motion.       2+ BLE edema.  Neurological: She is alert and oriented to person, place, and time.  Skin: Skin is warm and dry. She is not diaphoretic.    ED Course  Procedures (including critical care time)   Labs Reviewed  CBC  DIFFERENTIAL  COMPREHENSIVE METABOLIC PANEL  TROPONIN I  PRO B NATRIURETIC PEPTIDE   No results found.   No diagnosis found.   Date: 01/31/2012  Rate: 104  Rhythm: sinus tachycardia  QRS Axis: left  Intervals: normal  ST/T Wave abnormalities: nonspecific ST changes  Conduction Disutrbances:left bundle branch block  Narrative Interpretation:   Old EKG Reviewed: unchanged    MDM  Feels better with IV lasix and diuresis.  No hypoxia or signs of MI.  Will discharge to home.  To increase lasix, return prn.        Geoffery Lyons, MD 01/31/12 [phone]

## 2012-02-23 ENCOUNTER — Ambulatory Visit (INDEPENDENT_AMBULATORY_CARE_PROVIDER_SITE_OTHER): Payer: Medicaid Other | Admitting: Pulmonary Disease

## 2012-02-23 ENCOUNTER — Encounter: Payer: Self-pay | Admitting: Pulmonary Disease

## 2012-02-23 ENCOUNTER — Telehealth: Payer: Self-pay | Admitting: Cardiology

## 2012-02-23 VITALS — BP 150/110 | HR 106 | Temp 98.0°F | Ht 69.0 in | Wt 298.4 lb

## 2012-02-23 DIAGNOSIS — G4733 Obstructive sleep apnea (adult) (pediatric): Secondary | ICD-10-CM

## 2012-02-23 NOTE — Progress Notes (Signed)
  Subjective:    Patient ID: [name], female    DOB: [birth year], 46 y.o.   MRN: [id number]  HPI The patient comes in today for followup of her obstructive sleep apnea.  She has been wearing CPAP fairly compliantly, and feels that it has helped her sleep and daytime alertness.  She is having issues with her mask comfort, but no issues with pressure tolerance.  She is also having dryness issues, and is unaware of how to adjust the heater on her humidifier.   Review of Systems  Constitutional: Negative for fever and unexpected weight change.  HENT: Positive for voice change. Negative for ear pain, nosebleeds, congestion, sore throat, rhinorrhea, sneezing, trouble swallowing, dental problem, postnasal drip and sinus pressure.   Eyes: Negative for redness and itching.  Respiratory: Negative for cough, chest tightness, shortness of breath and wheezing.   Cardiovascular: Negative for palpitations and leg swelling.  Gastrointestinal: Negative for nausea and vomiting.  Genitourinary: Negative for dysuria.  Musculoskeletal: Negative for joint swelling.  Skin: Negative for rash.  Neurological: Positive for headaches.  Hematological: Does not bruise/bleed easily.  Psychiatric/Behavioral: Negative for dysphoric mood. The patient is not nervous/anxious.        Objective:   Physical Exam Morbidly obese female in no acute distress No skin breakdown or pressure necrosis from the CPAP mask Lower extremities with edema, no cyanosis Alert and oriented, moves all 4 extremities.       Assessment & Plan:

## 2012-02-23 NOTE — Telephone Encounter (Signed)
LMTCB

## 2012-02-23 NOTE — Telephone Encounter (Signed)
New msg Pt wants to talk to you about renal artery doppler and when she needs to see Dr Shirlee Latch again. Please call her back

## 2012-02-23 NOTE — Patient Instructions (Signed)
Continue to work with cpap Will have your dme show you how to use heated humidifier to help with dryness, and also show you nasal pillows as an alternative mask Work on weight loss followup with me in 6mos

## 2012-02-23 NOTE — Assessment & Plan Note (Signed)
The patient is trying to wear CPAP compliantly, and feels that she is getting about 4 hours a night with the device.  She is having no issue with pressure, but is bothered by her current CPAP mask.  She is getting more accustomed to the mask, but I will have her DME show her nasal pillows with the addition of a chin strap.  I also discussed with her how to improve her notification in the system.  I've encouraged her to work on weight loss, and we'll see her back in 6 months.

## 2012-02-24 NOTE — Telephone Encounter (Signed)
LMTCB

## 2012-04-14 ENCOUNTER — Encounter: Payer: Medicaid Other | Attending: Physical Medicine & Rehabilitation | Admitting: Physical Medicine & Rehabilitation

## 2012-04-14 ENCOUNTER — Encounter: Payer: Self-pay | Admitting: Cardiology

## 2012-04-14 ENCOUNTER — Encounter: Payer: Self-pay | Admitting: Physical Medicine & Rehabilitation

## 2012-04-14 ENCOUNTER — Ambulatory Visit (INDEPENDENT_AMBULATORY_CARE_PROVIDER_SITE_OTHER): Payer: Medicaid Other | Admitting: Cardiology

## 2012-04-14 VITALS — BP 160/118 | HR 102 | Resp 16 | Ht 69.0 in | Wt 295.0 lb

## 2012-04-14 DIAGNOSIS — I509 Heart failure, unspecified: Secondary | ICD-10-CM

## 2012-04-14 DIAGNOSIS — M1710 Unilateral primary osteoarthritis, unspecified knee: Secondary | ICD-10-CM | POA: Insufficient documentation

## 2012-04-14 DIAGNOSIS — M47817 Spondylosis without myelopathy or radiculopathy, lumbosacral region: Secondary | ICD-10-CM | POA: Insufficient documentation

## 2012-04-14 DIAGNOSIS — E1142 Type 2 diabetes mellitus with diabetic polyneuropathy: Secondary | ICD-10-CM | POA: Insufficient documentation

## 2012-04-14 DIAGNOSIS — M1712 Unilateral primary osteoarthritis, left knee: Secondary | ICD-10-CM | POA: Insufficient documentation

## 2012-04-14 DIAGNOSIS — Z8679 Personal history of other diseases of the circulatory system: Secondary | ICD-10-CM

## 2012-04-14 DIAGNOSIS — E1165 Type 2 diabetes mellitus with hyperglycemia: Secondary | ICD-10-CM | POA: Insufficient documentation

## 2012-04-14 DIAGNOSIS — I10 Essential (primary) hypertension: Secondary | ICD-10-CM

## 2012-04-14 DIAGNOSIS — M47816 Spondylosis without myelopathy or radiculopathy, lumbar region: Secondary | ICD-10-CM | POA: Insufficient documentation

## 2012-04-14 DIAGNOSIS — I5032 Chronic diastolic (congestive) heart failure: Secondary | ICD-10-CM | POA: Insufficient documentation

## 2012-04-14 DIAGNOSIS — E1149 Type 2 diabetes mellitus with other diabetic neurological complication: Secondary | ICD-10-CM | POA: Insufficient documentation

## 2012-04-14 DIAGNOSIS — G609 Hereditary and idiopathic neuropathy, unspecified: Secondary | ICD-10-CM | POA: Insufficient documentation

## 2012-04-14 DIAGNOSIS — M25559 Pain in unspecified hip: Secondary | ICD-10-CM

## 2012-04-14 DIAGNOSIS — E1140 Type 2 diabetes mellitus with diabetic neuropathy, unspecified: Secondary | ICD-10-CM | POA: Insufficient documentation

## 2012-04-14 DIAGNOSIS — E669 Obesity, unspecified: Secondary | ICD-10-CM | POA: Insufficient documentation

## 2012-04-14 MED ORDER — CARBAMAZEPINE ER 200 MG PO TB12: 200.0000 mg | ORAL_TABLET | Freq: Two times a day (BID) | ORAL | Status: DC

## 2012-04-14 MED ORDER — FUROSEMIDE 40 MG PO TABS: 80.0000 mg | ORAL_TABLET | Freq: Two times a day (BID) | ORAL | Status: DC

## 2012-04-14 MED ORDER — HYDROCODONE-ACETAMINOPHEN 5-325 MG PO TABS: 1.0000 | ORAL_TABLET | Freq: Four times a day (QID) | ORAL | Status: DC | PRN

## 2012-04-14 MED ORDER — DICLOFENAC SODIUM 1 % TD GEL: 1.0000 "application " | Freq: Four times a day (QID) | TRANSDERMAL | Status: DC

## 2012-04-14 NOTE — Patient Instructions (Signed)
Your physician has recommended you make the following change in your medication: Take 120mg  (3 tabs) tonight, take 120mg  (3 tabs) Saturday am and 80mg  (2 tabs) Sat pm.  Take 80mg  (2 tabs) twice daily thereafter.  Please keep your appt with Tereso Newcomer PA-C on Monday at 2:00pm

## 2012-04-14 NOTE — Assessment & Plan Note (Addendum)
We know from catheterization that she does not have significant coronary disease. She is not having significant chest pain at this time.

## 2012-04-14 NOTE — Patient Instructions (Signed)
PLEASE CONTACT DR. MCCLEAN REGARDING YOUR BLOOD PRESSURE AND FLUID BUILD UP.

## 2012-04-14 NOTE — Progress Notes (Signed)
HPI  Patient is seen today as an add-on for hypertension. Today she was seen Dr.Swartz. About a joint problem. Her blood pressure was up and she said she was getting fluid. He call her office and we are making plans to see her in the office next week. He was then felt that her pressure was high enough that she could calm to our office to be seen today. She's not having any chest pain or shortness of breath. I carefully questioned her about her salt. She says she is no extra salt. She does admit that she drinks extra fluid including water. She has been taking her medications. She first came in her pressure was 170/110. Repeat was 165/104.  As part of today's evaluation I have reviewed the records to learn about the patient. Also reviewed a note today by Dr. Riley Kill  Allergies  Allergen Reactions  . Clindamycin Other (See Comments)    REACTION: Reaction not known  . Enalapril Maleate Other (See Comments)    REACTION: Reaction not known  . Lisinopril Other (See Comments)    REACTION: Reaction not known    Current Outpatient Prescriptions  Medication Sig Dispense Refill  . albuterol (VENTOLIN HFA) 108 (90 BASE) MCG/ACT inhaler Inhale 2 puffs into the lungs every 6 (six) hours as needed. For wheezing      . amLODipine (NORVASC) 5 MG tablet Take 5 mg by mouth 2 (two) times daily.      [name] aspirin 325 MG tablet Take 325 mg by mouth daily.       . carbamazepine (TEGRETOL XR) 200 MG 12 hr tablet Take 1 tablet (200 mg total) by mouth 2 (two) times daily. Take at night only for 7 days then increase to twice daily  60 tablet  3  . citalopram (CELEXA) 20 MG tablet Take 20 mg by mouth daily.       . diclofenac sodium (VOLTAREN) 1 % GEL Apply 1 application topically 4 (four) times daily.  3 Tube  4  . furosemide (LASIX) 40 MG tablet TAKE  80 MG IN THE AM AND TAKE 40 IN THE PM      . HYDROcodone-acetaminophen (NORCO) 5-325 MG per tablet Take 1 tablet by mouth every 6 (six) hours as needed. For pain  75  tablet  1  . insulin aspart protamine-insulin aspart (NOVOLOG 70/30) (70-30) 100 UNIT/ML injection Inject 15 Units into the skin 2 (two) times daily with a meal.       . metFORMIN (GLUMETZA) 1000 MG (MOD) 24 hr tablet Take 1,000 mg by mouth 2 (two) times daily with a meal.       . methocarbamol (ROBAXIN) 500 MG tablet Take 500 mg by mouth every 6 (six) hours as needed. For muscle spasm      . metoprolol succinate (TOPROL-XL) 100 MG 24 hr tablet Take 1 and 1/2 tablet in AM and 1 tablet in the Evening.      . Omega-3 Fatty Acids (FISH OIL) 1000 MG CAPS Take 2 capsules (2,000 mg total) by mouth daily.  60 capsule  3  . pantoprazole (PROTONIX) 40 MG tablet Take 40 mg by mouth daily.       . potassium chloride (KLOR-CON) 10 MEQ CR tablet Take 10 mEq by mouth daily.        History   Social History  . Marital Status: Single    Spouse Name: N/A    Number of Children: 1  . Years of Education: N/A  Occupational History  . unemployed    Social History Main Topics  . Smoking status: Never Smoker   . Smokeless tobacco: Not on file  . Alcohol Use: No  . Drug Use: No  . Sexually Active: Not on file   Other Topics Concern  . Not on file   Social History Narrative   Lives with son.     Family History  Problem Relation Age of Onset  . Heart disease Mother   . Heart disease Paternal Grandmother   . Heart disease Paternal Grandfather   . Heart disease Father   . Stroke Paternal Grandfather     Past Medical History  Diagnosis Date  . Hypertension   . Diabetes mellitus, type 2     HbA1C  11.1 8/12  . NICM (nonischemic cardiomyopathy)     EF 45-50% in 8/12, cath 6/12 showed normal coronaries  . Morbid obesity   . Allergic rhinitis   . HLD (hyperlipidemia)     LDL 110.9 in 9/12  . OSA (obstructive sleep apnea)     sleep study in 8/12 showed moderate to severe OSA requiring CPAP  . Chronic lower back pain     secondary to DJD, obseity, hip problems. Sees Dr. Ivory Broad  . DJD  (degenerative joint disease) of hip     right sided  . CHF (congestive heart failure)     multiple admission, last one in 8/12. Discharge weight  135 kg  . Degeneration of lumbar or lumbosacral intervertebral disc   . Polyneuropathy in diabetes   . Thoracic or lumbosacral neuritis or radiculitis, unspecified   . Calcifying tendinitis of shoulder     Past Surgical History  Procedure Date  . Breast biopsies   . Tubal ligation 05/31/1985  . Hand surgery     Left  . Shoulder surgery     right  . Dental surgery     tumor removed     ROS   Patient denies fever, chills, headache, sweats, rash, change in vision, change in hearing, chest pain, cough, nausea vomiting, urinary symptoms. All other systems are reviewed and are negative.  PHYSICAL EXAM Patient is oriented to person time and place. Affect is normal. There is no jugulovenous distention. Lungs are clear. Respiratory effort is nonlabored. Cardiac exam reveals S1 and S2. There no clicks or significant murmurs. The abdomen is soft. There is 1+ peripheral edema.  There were no vitals filed for this visit.   ASSESSMENT & PLAN

## 2012-04-14 NOTE — Assessment & Plan Note (Addendum)
The patient feels tightness in her abdomen when she begins again of fluid. She is having some of this. She does have 1+ edema. Compared to the last time she was in the office her weight is not significantly elevated. However it is hard to know exactly what her baseline dry weight is. Her Lasix dose will be increased over several days taking 120 mg twice a day for a day and then 80 twice a day until she is seen back next week. I spoken with her again about being careful with total fluid intake including water.

## 2012-04-14 NOTE — Progress Notes (Signed)
Subjective:    Patient ID: [name], female    DOB: 08/25/1966, 46 y.o.   MRN: [id number]  HPI  [name] is back regarding her multiple pain complaints.  She notes about a month ago that when she was going up the stairs, her knee started to hurt and then she experienced some swelling along the lateral aspect.  She reports that about a month ago, she woke up with back pain and had difficulty getting oob. Sx lasted about 3 days and pretty much resolved with rest. She  Did receive some percocet from another doctor which helped get her through the "crisis". Normally her hydrocodone is sufficient for her pains. She never filled the tegretol i wrote for her apparently.  She tries to walk for exercise when she's up to it.  [name] also reports elevated bp's over the last few days. She's also noted fluid build up on her legs. She occasionally gets short winded with exercise.  Pain Inventory Average Pain 9 Pain Right Now 10 My pain is sharp, tingling and aching  In the last 24 hours, has pain interfered with the following? General activity 3 Relation with others 5 Enjoyment of life 4 What TIME of day is your pain at its worst? All Day Sleep (in general) Fair  Pain is worse with: walking, bending and sitting Pain improves with: rest and medication Relief from Meds: 5  Mobility use a cane  Function I need assistance with the following:  household duties  Neuro/Psych bladder control problems weakness numbness  Prior Studies Any changes since last visit?  no  Physicians involved in your care Any changes since last visit?  no  Review of Systems  Constitutional: Positive for appetite change and unexpected weight change.  HENT: Negative.   Eyes: Negative.   Respiratory: Negative.   Cardiovascular: Negative.   Gastrointestinal: Positive for nausea, vomiting and abdominal pain.  Genitourinary: Negative.   Musculoskeletal: Negative.   Skin: Negative.   Neurological:  Positive for weakness and numbness.  Hematological: Negative.   Psychiatric/Behavioral: Negative.        Objective:   Physical Exam  Constitutional: She is oriented to person, place, and time. She appears well-developed and well-nourished.       obese  HENT:  Head: Normocephalic and atraumatic.  Nose: Nose normal.  Eyes: Conjunctivae and EOM are normal. Pupils are equal, round, and reactive to light.  Neck: Normal range of motion.  Cardiovascular: Normal rate and regular rhythm.   Pulmonary/Chest: Effort normal and breath sounds normal.  Abdominal: She exhibits no distension. There is no tenderness.  Musculoskeletal:       Lumbar back: She exhibits decreased range of motion, tenderness, bony tenderness, pain and spasm.       1++ pitting edema both lower ext  Mild joint line tenderness laterally on the left.  Meniscal maneuvers are positive.     Neurological: She is alert and oriented to person, place, and time. She has normal strength and normal reflexes. A sensory deficit (stocking glove both lets) is present. She displays a negative Romberg sign.  Psychiatric: She has a normal mood and affect. Her behavior is normal. Judgment and thought content normal.          Assessment & Plan:  ASSESSMENT:  1. Chronic low back pain which is multifactorial. Recent MRI of the  back is notable for disc disease and facet hypertrophy. Most  involved level appeared to be L4-L5, where there is a shallow broad-  based  disk herniation and facet arthropathy with mild stenosis.  2. Diabetic peripheral neuropathy.  3. Bilateral rotator cuff syndrome.  4. Morbid obesity.  5. Cardiomyopathy/HTN 6. 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  PLAN:  1. Will try tegretol again (as she never go it filled last time).  2. I refilled her hydrocodone #75, 5/325.  3. Encouraged activity to tolerance. I fully support water aerobics or a stationary bike in addition to walking.  4. She is working  on better glycemic control.  5. Cardiac workup per Dr. Shirlee Latch. We are contacting Dr. Alford Highland office to see if they can work her in today given her severe hypertension and volume overload. 6. Voltaren gel for left knee. Consider xrays if persistent symptoms. Neoprene knee sleeve would be helpful as well. 7. I will see her back in 3 months' time. She will call me with any  problems or questions

## 2012-04-14 NOTE — Assessment & Plan Note (Signed)
Blood pressure is elevated today. However it is not out of the range of pressures that we have seen from her in the past. She clearly needs better blood pressure. Her blood pressure may be very volume dependent. She cannot take an ACE inhibitor or an ARB. She is on a beta blocker and a high dose and amlodipine. I've consider adding clonidine or equivalent but I decided first to start with trying to diuresis her.

## 2012-04-14 NOTE — Progress Notes (Signed)
Addended by: Doreene Eland on: 04/14/2012 02:26 PM   Modules accepted: Orders

## 2012-04-17 ENCOUNTER — Ambulatory Visit (INDEPENDENT_AMBULATORY_CARE_PROVIDER_SITE_OTHER): Payer: Medicaid Other | Admitting: Physician Assistant

## 2012-04-17 ENCOUNTER — Ambulatory Visit: Payer: Medicaid Other | Admitting: Cardiology

## 2012-04-17 ENCOUNTER — Encounter: Payer: Self-pay | Admitting: Physician Assistant

## 2012-04-17 VITALS — BP 168/90 | HR 102 | Ht 69.0 in | Wt 288.4 lb

## 2012-04-17 DIAGNOSIS — I5033 Acute on chronic diastolic (congestive) heart failure: Secondary | ICD-10-CM

## 2012-04-17 DIAGNOSIS — I10 Essential (primary) hypertension: Secondary | ICD-10-CM

## 2012-04-17 DIAGNOSIS — R0602 Shortness of breath: Secondary | ICD-10-CM

## 2012-04-17 NOTE — Progress Notes (Signed)
[address] [place], [place]  [address] Phone: [phone] Fax:  [phone]  Date:  04/17/2012   Name:  [name]   DOB:  12-01-1966   MRN:  [id number]  PCP:  Georganna Skeans, MD, MD  Primary Cardiologist:  Dr. Marca Ancona  Primary Electrophysiologist:  None    History of Present Illness: [name] is a 46 y.o. female who returns for follow up on BP.    She has a history of primarily diastolic CHF in the setting of poorly-controlled HTN. She has been hospitalized multiple times. She was admitted in 6/12 with chest pain and new LBBB and ended up getting a left heart cath, which showed no angiographic CAD. She was admitted in 8/12 with chest pain/dyspnea and had a CT angiogram of the chest negative for PE.  Blood pressure was controlled and she was diuresed and started on CPAP. Echo showed EF 45-50% with LV hypertrophy.   Admitted 11/2011 with a/c diastolic CHF.    She was seen as an add-on by Dr. Myrtis Ser 5/3 for blood pressure.  There was concern for volume overload.  Her Lasix was increased for a couple of days with planned follow up today.  Weight is down 7 pounds as noted below.  States her breathing is slightly better.  Probably NYHA class 2b.  Sleeps on 3 pillows chronically.  No PND.  Sleeps with CPAP.  Notes L sided CP.  This symptom is similar to symptoms in past with volume overload.  No LE edema.  Feels like abd girth increased and only slightly improved with increased lasix.  No syncope.    Wt Readings from Last 3 Encounters:  04/17/12 288 lb 6.4 oz (130.817 kg)  04/14/12 295 lb (133.811 kg)  02/23/12 298 lb 6.4 oz (135.353 kg)     Potassium  Date/Time Value Range Status  01/31/2012  6:00 PM 3.9  3.5-5.1 (mEq/L) Final  11/25/2011  6:03 AM 3.7  3.5-5.1 (mEq/L) Final     Creatinine, Ser  Date/Time Value Range Status  01/31/2012  6:00 PM 0.74  0.50-1.10 (mg/dL) Final  12/15/7251  6:64 AM 1.00  0.50-1.10 (mg/dL) Final     Past  Medical History  Diagnosis Date  . Hypertension     Poorly controlled. Has had HTN since age 86. Angioedema with ACEI.  24 Hr urine and renal arterial dopplers ordered . . . Never done  . Diabetes mellitus, type 2     HbA1C  11.1 8/12  . NICM (nonischemic cardiomyopathy)     EF 45-50% in 8/12, cath 6/12 showed normal coronaries, EF 50-55% by LV gram  . Morbid obesity   . Allergic rhinitis   . HLD (hyperlipidemia)     LDL 110.9 in 9/12  . OSA (obstructive sleep apnea)     sleep study in 8/12 showed moderate to severe OSA requiring CPAP  . Chronic lower back pain     secondary to DJD, obseity, hip problems. Sees Dr. Ivory Broad  . 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 Chronic diastolic heart failure      Primarily diastolic CHF: Likely due to uncontrolled HTN. Last echo (8/12) with EF 45-50%, mild to moderate LVH with some asymmetric septal hypertrophy, RV normal size and systolic function. EF 50-55% by LV-gram in 6/12.   . Degeneration of lumbar or lumbosacral intervertebral disc   . Polyneuropathy in diabetes   . Thoracic or lumbosacral neuritis or  radiculitis, unspecified   . Calcifying tendinitis of shoulder   . LBBB (left bundle branch block)     Current Outpatient Prescriptions  Medication Sig Dispense Refill  . albuterol (VENTOLIN HFA) 108 (90 BASE) MCG/ACT inhaler Inhale 2 puffs into the lungs every 6 (six) hours as needed. For wheezing      . amLODipine (NORVASC) 5 MG tablet Take 5 mg by mouth 2 (two) times daily.      [name] aspirin 325 MG tablet Take 325 mg by mouth daily.       . carbamazepine (TEGRETOL XR) 200 MG 12 hr tablet Take 1 tablet (200 mg total) by mouth 2 (two) times daily. Take at night only for 7 days then increase to twice daily  60 tablet  3  . citalopram (CELEXA) 20 MG tablet Take 20 mg by mouth daily.       . diclofenac sodium (VOLTAREN) 1 % GEL Apply 1 application topically 4 (four) times daily.  3 Tube  4  . furosemide (LASIX) 40 MG  tablet Take 2 tablets (80 mg total) by mouth 2 (two) times daily. TAKE  80 MG IN THE AM AND TAKE 40 IN THE PM  60 tablet  6  . HYDROcodone-acetaminophen (NORCO) 5-325 MG per tablet Take 1 tablet by mouth every 6 (six) hours as needed. For pain  75 tablet  1  . insulin aspart protamine-insulin aspart (NOVOLOG 70/30) (70-30) 100 UNIT/ML injection Inject 15 Units into the skin 2 (two) times daily with a meal.       . metFORMIN (GLUMETZA) 1000 MG (MOD) 24 hr tablet Take 1,000 mg by mouth 2 (two) times daily with a meal.       . methocarbamol (ROBAXIN) 500 MG tablet Take 500 mg by mouth every 6 (six) hours as needed. For muscle spasm      . metoprolol succinate (TOPROL-XL) 100 MG 24 hr tablet Take 1 and 1/2 tablet in AM and 1 tablet in the Evening.      . Omega-3 Fatty Acids (FISH OIL) 1000 MG CAPS Take 2 capsules (2,000 mg total) by mouth daily.  60 capsule  3  . pantoprazole (PROTONIX) 40 MG tablet Take 40 mg by mouth daily.       . potassium chloride (KLOR-CON) 10 MEQ CR tablet Take 10 mEq by mouth daily.        Allergies: Allergies  Allergen Reactions  . Clindamycin Other (See Comments)    REACTION: Reaction not known  . Enalapril Maleate Other (See Comments)    REACTION: Reaction not known  . Lisinopril Other (See Comments)    REACTION: Reaction not known    History  Substance Use Topics  . Smoking status: Never Smoker   . Smokeless tobacco: Not on file  . Alcohol Use: No     ROS:  Please see the history of present illness.   Notes constipation and diarrhea.  Feels cold sometimes.  All other systems reviewed and negative.   PHYSICAL EXAM: VS:  BP 168/90  Pulse 102  Ht 5\' 9"  (1.753 m)  Wt 288 lb 6.4 oz (130.817 kg)  BMI 42.59 kg/m2 Well nourished, well developed, in no acute distress HEENT: normal Neck: + JVD Cardiac:  normal S1, S2; RRR; no murmur; no S3 Lungs:  Decreased breath sounds bilaterally, no wheezing, rhonchi or rales Abd: distended, nontender, no  hepatomegaly Ext: very trace bilateral LE edema Skin: warm and dry Neuro:  CNs 2-12 intact, no focal abnormalities noted  EKG:  Sinus tachy, HR 102., normal axis, IVCD, PRWP, LAE, non-specific ST-T changes    ASSESSMENT AND PLAN:  1. Acute on Chronic Diastolic CHF I think she is still volume overloaded.  She notes baseline weight at home 274-275 lbs when she feels well.  Today, she was 292. Weight is down 7 lbs here.  Increase Lasix back to 120 mg in A and 80 mg in P.  Check BMET and BNP today.  Repeat BMET in one week.  Increase K+ to 10 mEq bid.  She likely has a high salt diet.  When questioned, she mentions eating at "[employer]."  She has a chicken sandwich, but I explained to her that all fast food restaurants use a lot of salt, regardless.  Will give her low sodium diet.  Follow up with me in one week.   2. Hypertension As above.  If volume improved next week and BP still up, will adjust meds.  She will have renal dopplers and 24 hr urine rescheduled.      Signed, Tereso Newcomer, PA-C  2:51 PM 04/17/2012

## 2012-04-17 NOTE — Progress Notes (Signed)
Addended by: Vickie Epley on: 04/17/2012 04:58 PM   Modules accepted: Orders

## 2012-04-17 NOTE — Patient Instructions (Addendum)
Increase lasix to 120mg  in the am and 80mg  in the pm  Increase Potassium to in the am and the pm  Lab work today bmet bnp 24 hour urine test Your physician has requested that you have a renal artery duplex. During this test, an ultrasound is used to evaluate blood flow to the kidneys. Allow one hour for this exam. Do not eat after midnight the day before and avoid carbonated beverages. Take your medications as you usually do.  Return to see [facility] on 04/24/2012 MONDAY   .2 Gram Low Sodium Diet A 2 gram sodium diet restricts the amount of sodium in the diet to no more than 2 g or 2000 mg daily. Limiting the amount of sodium is often used to help lower blood pressure. It is important if you have heart, liver, or kidney problems. Many foods contain sodium for flavor and sometimes as a preservative. When the amount of sodium in a diet needs to be low, it is important to know what to look for when choosing foods and drinks. The following includes some information and guidelines to help make it easier for you to adapt to a low sodium diet. QUICK TIPS  Do not add salt to food.   Avoid convenience items and fast food.   Choose unsalted snack foods.   Buy lower sodium products, often labeled as "lower sodium" or "no salt added."   Check food labels to learn how much sodium is in 1 serving.   When eating at a restaurant, ask that your food be prepared with less salt or none, if possible.  READING FOOD LABELS FOR SODIUM INFORMATION The nutrition facts label is a good place to find how much sodium is in foods. Look for products with no more than 500 to 600 mg of sodium per meal and no more than 150 mg per serving. Remember that 2 g = 2000 mg. The food label may also list foods as:  Sodium-free: Less than 5 mg in a serving.   Very low sodium: 35 mg or less in a serving.   Low-sodium: 140 mg or less in a serving.   Light in sodium: 50% less sodium in a serving. For example, if a  food that usually has 300 mg of sodium is changed to become light in sodium, it will have 150 mg of sodium.   Reduced sodium: 25% less sodium in a serving. For example, if a food that usually has 400 mg of sodium is changed to reduced sodium, it will have 300 mg of sodium.  CHOOSING FOODS Grains  Avoid: Salted crackers and snack items. Some cereals, including instant hot cereals. Bread stuffing and biscuit mixes. Seasoned rice or pasta mixes.   Choose: Unsalted snack items. Low-sodium cereals, oats, puffed wheat and rice, shredded wheat. English muffins and bread. Pasta.  Meats  Avoid: Salted, canned, smoked, spiced, pickled meats, including fish and poultry. Bacon, ham, sausage, cold cuts, hot dogs, anchovies.   Choose: Low-sodium canned tuna and salmon. Fresh or frozen meat, poultry, and fish.  Dairy  Avoid: Processed cheese and spreads. Cottage cheese. Buttermilk and condensed milk. Regular cheese.   Choose: Milk. Low-sodium cottage cheese. Yogurt. Sour cream. Low-sodium cheese.  Fruits and Vegetables  Avoid: Regular canned vegetables. Regular canned tomato sauce and paste. Frozen vegetables in sauces. Olives. Rosita Fire. Relishes. Sauerkraut.   Choose: Low-sodium canned vegetables. Low-sodium tomato sauce and paste. Frozen or fresh vegetables. Fresh and frozen fruit.  Condiments  Avoid: Canned  and packaged gravies. Worcestershire sauce. Tartar sauce. Barbecue sauce. Soy sauce. Steak sauce. Ketchup. Onion, garlic, and table salt. Meat flavorings and tenderizers.   Choose: Fresh and dried herbs and spices. Low-sodium varieties of mustard and ketchup. Lemon juice. Tabasco sauce. Horseradish.  SAMPLE 2 GRAM SODIUM MEAL PLAN Breakfast / Sodium (mg)  1 cup low-fat milk / 143 mg   2 slices whole-wheat toast / 270 mg   1 tbs heart-healthy margarine / 153 mg   1 hard-boiled egg / 139 mg   1 small orange / 0 mg  Lunch / Sodium (mg)  1 cup raw carrots / 76 mg    cup hummus / 298  mg   1 cup low-fat milk / 143 mg    cup red grapes / 2 mg   1 whole-wheat pita bread / 356 mg  Dinner / Sodium (mg)  1 cup whole-wheat pasta / 2 mg   1 cup low-sodium tomato sauce / 73 mg   3 oz lean ground beef / 57 mg   1 small side salad (1 cup raw spinach leaves,  cup cucumber,  cup yellow bell pepper) with 1 tsp olive oil and 1 tsp red wine vinegar / 25 mg  Snack / Sodium (mg)  1 container low-fat vanilla yogurt / 107 mg   3 graham cracker squares / 127 mg  Nutrient Analysis  Calories: 2033   Protein: 77 g   Carbohydrate: 282 g   Fat: 72 g   Sodium: 1971 mg  Document Released: 11/29/2005 Document Revised: 11/18/2011 Document Reviewed: 03/02/2010 [facility] Patient Information 2012 [place], [place].

## 2012-04-18 ENCOUNTER — Other Ambulatory Visit (INDEPENDENT_AMBULATORY_CARE_PROVIDER_SITE_OTHER): Payer: Medicaid Other

## 2012-04-18 ENCOUNTER — Telehealth: Payer: Self-pay | Admitting: *Deleted

## 2012-04-18 DIAGNOSIS — R0602 Shortness of breath: Secondary | ICD-10-CM

## 2012-04-18 DIAGNOSIS — I10 Essential (primary) hypertension: Secondary | ICD-10-CM

## 2012-04-18 LAB — BRAIN NATRIURETIC PEPTIDE: Pro B Natriuretic peptide (BNP): 16 pg/mL (ref 0.0–100.0)

## 2012-04-18 LAB — BASIC METABOLIC PANEL
BUN: 13 mg/dL (ref 6–23)
CO2: 25 mEq/L (ref 19–32)
Chloride: 100 mEq/L (ref 96–112)
Creatinine, Ser: 0.8 mg/dL (ref 0.4–1.2)
Glucose, Bld: 238 mg/dL — ABNORMAL HIGH (ref 70–99)

## 2012-04-18 NOTE — Telephone Encounter (Signed)
pt notified of lab results and will have repeat bmet 5/13 same day as SW appt

## 2012-04-18 NOTE — Telephone Encounter (Signed)
Message copied by Tarri Fuller on Tue Apr 18, 2012  4:27 PM ------      Message from: [place], [place] T      Created: Tue Apr 18, 2012  3:45 PM       BNP normal - ? If falsely low       Continue increased dose of Lasix and K+ as discussed at office visit yesterday for total of 3 more days.      Then resume prior dose of Lasix and K+ (Lasix 80 mg bid and K+ 10 mEq daily)      Make sure she has repeat BMET in one week.      Weigh daily and call if:  Weight up 3 lbs in one day, increased swelling or increased dyspnea.       Tereso Newcomer, PA-C  3:44 PM 04/18/2012

## 2012-04-18 NOTE — Telephone Encounter (Signed)
Lab order placed today for bmet to be done 5/13 same day pt sees [employer]. PAC

## 2012-04-19 ENCOUNTER — Telehealth: Payer: Self-pay | Admitting: Physical Medicine & Rehabilitation

## 2012-04-19 NOTE — Telephone Encounter (Signed)
PLEASE NOTE THAT ETOH WAS DETECTED ON UDS--MIGHT BE RELATED TO HER DM  --ZTS

## 2012-04-21 NOTE — Telephone Encounter (Signed)
Pt has seen Dr Myrtis Ser 04/14/12 and Tereso Newcomer 04/17/12.

## 2012-04-21 NOTE — Telephone Encounter (Signed)
Unable to reach pt by telephone after numerous attempts.  

## 2012-04-24 ENCOUNTER — Encounter: Payer: Self-pay | Admitting: Physician Assistant

## 2012-04-24 ENCOUNTER — Ambulatory Visit (INDEPENDENT_AMBULATORY_CARE_PROVIDER_SITE_OTHER): Payer: Medicaid Other | Admitting: Physician Assistant

## 2012-04-24 ENCOUNTER — Other Ambulatory Visit: Payer: Medicaid Other

## 2012-04-24 VITALS — BP 162/108 | HR 84 | Ht 69.0 in | Wt 283.0 lb

## 2012-04-24 DIAGNOSIS — R0602 Shortness of breath: Secondary | ICD-10-CM

## 2012-04-24 DIAGNOSIS — I5032 Chronic diastolic (congestive) heart failure: Secondary | ICD-10-CM

## 2012-04-24 DIAGNOSIS — G4733 Obstructive sleep apnea (adult) (pediatric): Secondary | ICD-10-CM

## 2012-04-24 DIAGNOSIS — I10 Essential (primary) hypertension: Secondary | ICD-10-CM

## 2012-04-24 DIAGNOSIS — I509 Heart failure, unspecified: Secondary | ICD-10-CM

## 2012-04-24 LAB — BASIC METABOLIC PANEL
CO2: 25 mEq/L (ref 19–32)
Chloride: 95 mEq/L — ABNORMAL LOW (ref 96–112)
Creatinine, Ser: 0.7 mg/dL (ref 0.4–1.2)
Sodium: 136 mEq/L (ref 135–145)

## 2012-04-24 MED ORDER — CLONIDINE HCL 0.1 MG PO TABS: 0.1000 mg | ORAL_TABLET | Freq: Two times a day (BID) | ORAL | Status: DC

## 2012-04-24 MED ORDER — POTASSIUM CHLORIDE ER 10 MEQ PO TBCR: 10.0000 meq | EXTENDED_RELEASE_TABLET | Freq: Every day | ORAL | Status: DC

## 2012-04-24 NOTE — Patient Instructions (Addendum)
Weigh daily:  If weight up 2-3 lbs in one day (or 5 lbs in one week), increased swelling or increased dyspnea - take extra Lasix 40 mg that day and call. Follow up with primary provider for your breathing.  You may need to get a "Pulmonary Function Test."  Your physician recommends that you schedule a follow-up appointment in: 1 month with Dr Shirlee Latch Your physician recommends that you have lab work drawn today and in 1 week (BMP) Your physician has recommended you make the following change in your medication: DECREASE Potassium to 1 daily and Furosemide 40 mg 2 tabs in the AM and 1 tab in the PM and START Clonidine 0.1 mg twice daily

## 2012-04-24 NOTE — Progress Notes (Signed)
[address] [place], [place]  [address] Phone: [phone] Fax:  [phone]  Date:  04/24/2012   Name:  [name]   DOB:  1966/03/28   MRN:  [id number]  PCP:  [facility]  Primary Cardiologist:  Dr. Marca Ancona  Primary Electrophysiologist:  None    History of Present Illness: [name] is a 46 y.o. female who returns for follow up on BP and CHF.    She has a history of primarily diastolic CHF in the setting of poorly-controlled HTN. She has been hospitalized multiple times. She was admitted in 6/12 with chest pain and new LBBB and ended up getting a left heart cath, which showed no angiographic CAD. She was admitted in 8/12 with chest pain/dyspnea and had a CT angiogram of the chest negative for PE.  Blood pressure was controlled and she was diuresed and started on CPAP. Echo showed EF 45-50% with LV hypertrophy.   Admitted 11/2011 with a/c diastolic CHF.    She was seen as an add-on by Dr. Myrtis Ser 5/3 for blood pressure.  There was concern for volume overload.  Her Lasix was increased for a couple of days.  I saw her in follow up 5/6 and weight was down 7 pounds.  Breathing was slightly better as was her BP.  I felt she still had some volume to lose and I kept her on higher dose lasix for several more days.  She is doing ok.  Still has DOE.  Probably Class IIb.  Has occasional sharp, atypical chest pains.  No syncope.  Felt dizzy last week and held her Lasix.  Had "an episode" of dyspnea yesterday and took her Lasix.  Sleeps at an incline chronically.  Notes occasional cough and wheezing.  No formal dx of asthma.  No syncope.    Wt Readings from Last 3 Encounters:  04/24/12 283 lb (128.368 kg)  04/17/12 288 lb 6.4 oz (130.817 kg)  04/14/12 295 lb (133.811 kg)     Potassium  Date/Time Value Range Status  04/18/2012  9:39 AM 3.8  3.5-5.1 (mEq/L) Final     Creatinine, Ser  Date/Time Value Range Status  04/18/2012  9:39 AM 0.8  0.4-1.2  (mg/dL) Final     Pro B Natriuretic peptide (BNP)  Date/Time Value Range Status  04/18/2012  9:39 AM 16.0  0.0-100.0 (pg/mL) Final    Past Medical History  Diagnosis Date  . Hypertension     Poorly controlled. Has had HTN since age 27. Angioedema with ACEI.  24 Hr urine and renal arterial dopplers ordered . . . Never done  . Diabetes mellitus, type 2     HbA1C  11.1 8/12  . NICM (nonischemic cardiomyopathy)     EF 45-50% in 8/12, cath 6/12 showed normal coronaries, EF 50-55% by LV gram  . Morbid obesity   . Allergic rhinitis   . HLD (hyperlipidemia)     LDL 110.9 in 9/12  . OSA (obstructive sleep apnea)     sleep study in 8/12 showed moderate to severe OSA requiring CPAP  . Chronic lower back pain     secondary to DJD, obseity, hip problems. Sees Dr. Ivory Broad  . 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 Chronic diastolic heart failure      Primarily diastolic CHF: Likely due to uncontrolled HTN. Last echo (8/12) with EF 45-50%, mild to moderate LVH with some asymmetric septal hypertrophy, RV  normal size and systolic function. EF 50-55% by LV-gram in 6/12.   . Degeneration of lumbar or lumbosacral intervertebral disc   . Polyneuropathy in diabetes   . Thoracic or lumbosacral neuritis or radiculitis, unspecified   . Calcifying tendinitis of shoulder   . LBBB (left bundle branch block)     Current Outpatient Prescriptions  Medication Sig Dispense Refill  . albuterol (VENTOLIN HFA) 108 (90 BASE) MCG/ACT inhaler Inhale 2 puffs into the lungs every 6 (six) hours as needed. For wheezing      . amLODipine (NORVASC) 5 MG tablet Take 5 mg by mouth 2 (two) times daily.      [name] aspirin 325 MG tablet Take 325 mg by mouth daily.       . carbamazepine (TEGRETOL XR) 200 MG 12 hr tablet Take 1 tablet (200 mg total) by mouth 2 (two) times daily. Take at night only for 7 days then increase to twice daily  60 tablet  3  . citalopram (CELEXA) 20 MG tablet Take 20 mg by mouth  daily.       . diclofenac sodium (VOLTAREN) 1 % GEL Apply 1 application topically 4 (four) times daily.  3 Tube  4  . furosemide (LASIX) 40 MG tablet 2 tabs in the AM and 1 tabs in the PM  90 tablet  3  . HYDROcodone-acetaminophen (NORCO) 5-325 MG per tablet Take 1 tablet by mouth every 6 (six) hours as needed. For pain  75 tablet  1  . insulin aspart protamine-insulin aspart (NOVOLOG 70/30) (70-30) 100 UNIT/ML injection Inject 15 Units into the skin 2 (two) times daily with a meal.       . metFORMIN (GLUMETZA) 1000 MG (MOD) 24 hr tablet Take 1,000 mg by mouth 2 (two) times daily with a meal.       . methocarbamol (ROBAXIN) 500 MG tablet Take 500 mg by mouth every 6 (six) hours as needed. For muscle spasm      . metoprolol succinate (TOPROL-XL) 100 MG 24 hr tablet Take 1 and 1/2 tablet in AM and 1 tablet in the Evening.      . NON FORMULARY Take 1 tablet by mouth. ( Multi-Herb Digestion & Detox Support )      . NON FORMULARY Take 1 tablet by mouth. ( Multi-Fiber Cleanse )      . NON FORMULARY Take 1 capsule by mouth. ( Body, Hair, Skin, & Nails Supplement )      . Omega-3 Fatty Acids (FISH OIL) 1000 MG CAPS Take 2 capsules (2,000 mg total) by mouth daily.  60 capsule  3  . pantoprazole (PROTONIX) 40 MG tablet Take 40 mg by mouth daily.       . potassium chloride (K-DUR) 10 MEQ tablet Take 1 tablet (10 mEq total) by mouth 2 (two) times daily.  90 tablet  3    Allergies: Allergies  Allergen Reactions  . Clindamycin Other (See Comments)    REACTION: Reaction not known  . Enalapril Maleate Other (See Comments)    REACTION: Reaction not known  . Lisinopril Other (See Comments)    REACTION: Reaction not known    History  Substance Use Topics  . Smoking status: Never Smoker   . Smokeless tobacco: Not on file  . Alcohol Use: No     ROS:  Please see the history of present illness.   Constipated.  Taking several OTC herbal supplements to help with bowel movements.   All other systems reviewed  and negative.   PHYSICAL EXAM: VS:  BP 162/108  Pulse 84  Ht 5\' 9"  (1.753 m)  Wt 283 lb (128.368 kg)  BMI 41.79 kg/m2 Repeat BP by me on left with thigh cuff: 144/96  Well nourished, well developed, in no acute distress HEENT: normal Neck: no JVD Cardiac:  normal S1, S2; RRR; no murmur; no S3 Lungs:  Clear to ausc bilaterally, no wheezing, rhonchi or rales Abd: distended, mild epigastric tenderness, no hepatomegaly Ext: no LE edema Skin: warm and dry Neuro:  CNs 2-12 intact, no focal abnormalities noted   ASSESSMENT AND PLAN:  1.  Chronic Diastolic CHF   -  I think her volume is stable.    -  Suspect her dyspnea is multifactorial and related to diastolic CHF, obesity, inadequately tx OSA and possibly reactive airway disease.   -  She had some dizziness and held her diuretic recently.  Will decrease Lasix to 80 mg in AM and 40 mg in PM with K+ 10 mEQ daily.     -  Check BMET today and repeat in one week.   -  Follow up with Dr. Marca Ancona in 4-6 weeks.   2.  Hypertension   -  Still needs control but numbers improved.   -  Add clonidine 0.1 mg bid.  3.  Dyspnea   -  She describes a lot of wheezing at times and has been using an albuterol inhaler.   -  I suggested she follow up with her PCP and consider getting formal PFTs.  4.  Sleep Apnea   -  She is sporadically using CPAP.  She states she cannot wear the mask and often wakes up in the middle of the night short of breath.  This usually responds with putting her CPAP mask on.  She states Dr. Shelle Iron is working with her to change masks.    Signed, Tereso Newcomer, PA-C  2:29 PM 04/24/2012

## 2012-04-24 NOTE — Progress Notes (Signed)
Addended by: Tonita Phoenix on: 04/24/2012 03:18 PM   Modules accepted: Orders

## 2012-04-25 ENCOUNTER — Encounter: Payer: Self-pay | Admitting: *Deleted

## 2012-04-25 ENCOUNTER — Telehealth: Payer: Self-pay | Admitting: *Deleted

## 2012-04-25 NOTE — Telephone Encounter (Signed)
no answer  x2

## 2012-04-25 NOTE — Telephone Encounter (Signed)
Message copied by Tarri Fuller on Tue Apr 25, 2012  5:12 PM ------      Message from: [place], [place] T      Created: Mon Apr 24, 2012  5:18 PM       Ok      Follow up with PCP for diabetes      Tereso Newcomer, PA-C  5:18 PM 04/24/2012

## 2012-04-25 NOTE — Telephone Encounter (Signed)
lmom ptcb about lab results

## 2012-04-25 NOTE — Telephone Encounter (Signed)
Message copied by Tarri Fuller on Tue Apr 25, 2012  9:50 AM ------      Message from: [place], [place] T      Created: Mon Apr 24, 2012  5:18 PM       Ok      Follow up with PCP for diabetes      Tereso Newcomer, PA-C  5:18 PM 04/24/2012

## 2012-04-26 ENCOUNTER — Telehealth: Payer: Self-pay | Admitting: *Deleted

## 2012-04-26 NOTE — Telephone Encounter (Signed)
Message copied by Tarri Fuller on Wed Apr 26, 2012  3:05 PM ------      Message from: [place], [place] T      Created: Mon Apr 24, 2012  5:18 PM       Ok      Follow up with PCP for diabetes      Tereso Newcomer, PA-C  5:18 PM 04/24/2012

## 2012-04-26 NOTE — Telephone Encounter (Signed)
lmom on daughters cell # for ptcb to advise pt to f/u w/PCP about DM

## 2012-04-28 ENCOUNTER — Telehealth: Payer: Self-pay | Admitting: *Deleted

## 2012-04-28 ENCOUNTER — Encounter (INDEPENDENT_AMBULATORY_CARE_PROVIDER_SITE_OTHER): Payer: Medicaid Other

## 2012-04-28 ENCOUNTER — Other Ambulatory Visit (INDEPENDENT_AMBULATORY_CARE_PROVIDER_SITE_OTHER): Payer: Medicaid Other

## 2012-04-28 DIAGNOSIS — I10 Essential (primary) hypertension: Secondary | ICD-10-CM

## 2012-04-28 DIAGNOSIS — I509 Heart failure, unspecified: Secondary | ICD-10-CM

## 2012-04-28 DIAGNOSIS — I5032 Chronic diastolic (congestive) heart failure: Secondary | ICD-10-CM

## 2012-04-28 LAB — BASIC METABOLIC PANEL
CO2: 26 mEq/L (ref 19–32)
Calcium: 8.9 mg/dL (ref 8.4–10.5)
Creatinine, Ser: 0.8 mg/dL (ref 0.4–1.2)
GFR: 102.41 mL/min (ref >60.00)
Sodium: 138 mEq/L (ref 135–145)

## 2012-04-28 NOTE — Telephone Encounter (Signed)
lmom todays labs ok

## 2012-04-28 NOTE — Telephone Encounter (Signed)
lmom to make sure pt f/u w/PCP about DM

## 2012-04-28 NOTE — Telephone Encounter (Signed)
Message copied by Tarri Fuller on Fri Apr 28, 2012  3:15 PM ------      Message from: [place], [place] T      Created: Fri Apr 28, 2012  1:30 PM       Please notify patient that the lab results are ok.      Tereso Newcomer, PA-C  1:30 PM 04/28/2012

## 2012-05-01 ENCOUNTER — Telehealth: Payer: Self-pay | Admitting: *Deleted

## 2012-05-01 ENCOUNTER — Other Ambulatory Visit: Payer: Medicaid Other

## 2012-05-01 NOTE — Telephone Encounter (Signed)
lmom renal duplex normal

## 2012-05-01 NOTE — Telephone Encounter (Signed)
Message copied by Tarri Fuller on Mon May 01, 2012  4:16 PM ------      Message from: [place], [place] T      Created: Mon May 01, 2012  1:36 PM       Normal renal arteries      Tereso Newcomer, PA-C  1:36 PM 05/01/2012

## 2012-05-26 ENCOUNTER — Other Ambulatory Visit (HOSPITAL_COMMUNITY): Payer: Self-pay | Admitting: Internal Medicine

## 2012-05-26 DIAGNOSIS — Z1231 Encounter for screening mammogram for malignant neoplasm of breast: Secondary | ICD-10-CM

## 2012-06-09 ENCOUNTER — Ambulatory Visit (INDEPENDENT_AMBULATORY_CARE_PROVIDER_SITE_OTHER): Payer: Medicaid Other | Admitting: Cardiology

## 2012-06-09 ENCOUNTER — Encounter: Payer: Self-pay | Admitting: *Deleted

## 2012-06-09 ENCOUNTER — Encounter: Payer: Self-pay | Admitting: Cardiology

## 2012-06-09 VITALS — BP 180/90 | HR 72 | Ht 69.0 in | Wt 292.0 lb

## 2012-06-09 DIAGNOSIS — I509 Heart failure, unspecified: Secondary | ICD-10-CM

## 2012-06-09 DIAGNOSIS — E669 Obesity, unspecified: Secondary | ICD-10-CM

## 2012-06-09 DIAGNOSIS — I10 Essential (primary) hypertension: Secondary | ICD-10-CM

## 2012-06-09 DIAGNOSIS — I5032 Chronic diastolic (congestive) heart failure: Secondary | ICD-10-CM

## 2012-06-09 MED ORDER — CLONIDINE HCL 0.2 MG PO TABS: 0.2000 mg | ORAL_TABLET | Freq: Two times a day (BID) | ORAL | Status: DC

## 2012-06-09 MED ORDER — SPIRONOLACTONE 25 MG PO TABS: 25.0000 mg | ORAL_TABLET | Freq: Every day | ORAL | Status: DC

## 2012-06-09 NOTE — Patient Instructions (Addendum)
Decrease aspirin to 81mg  daily.  Increase clonidine to 0.2mg  twice a day. You can take two 0.1mg  tablets twice a day and use your current supply.  Start spironolactone 25mg  daily.  Your physician recommends that you return for lab work in: 1 week--BMET/BNP  Your physician recommends that you schedule a follow-up appointment in: 1 month with Lilian Coma  Your physician recommends that you schedule a follow-up appointment in: 3 months with Dr Shirlee Latch.

## 2012-06-12 DIAGNOSIS — I10 Essential (primary) hypertension: Secondary | ICD-10-CM | POA: Insufficient documentation

## 2012-06-12 NOTE — Progress Notes (Signed)
Patient ID: [name], female   DOB: 15-Dec-1965, 46 y.o.   MRN: [id number] PCP: Dr. Haywood Pao  46 yo with history of primarily diastolic CHF in the setting of poorly-controlled HTN presents for cardiology followup.  She was admitted in 6/12 with chest pain and new LBBB and ended up getting a left heart cath, which showed no angiographic CAD.  Renal artery dopplers were normal in 5/13.    Lately, [name] has been breathing ok.  She is stably short of breath after walking about 1 block or going up a flight of steps.  Weight is up, but she has been eating more and exercising less.  BP is still running high.  She is not very active because of her bilateral knee pain.    Labs (6/12): hgbA1c 11.1 Labs (8/12): pro-BNP 309, K 3.7, creatinine 0.86 Labs (2/13): BNP 437 Labs (5/13): K 3.8, creatinine 0.8, BNP 16  PMH: 1. Chest pain: Left heart cath in 6/12 with no angiographic CAD.  2. LBBB 3. Primarily diastolic CHF: Likely due to uncontrolled HTN.  Last echo (8/12) with EF 45-50%, mild to moderate LVH with some asymmetric septal hypertrophy, RV normal size and systolic function.  EF 50-55% by LV-gram in 6/12.  4. HTN: Poorly controlled.  Has had HTN since age 74.  Angioedema with ACEI.  Renal artery dopplers (5/13) with no evidence for renal artery stenosis.  5. Diabetes: Poorly controlled.  6. OSA on CPAP  SH: Unable to work and has applied for disability.  Nonsmoker.  No ETOH.   FH: Mother died from CHF in her 60s (renal failure, HTN).  Father with CHF, HTN.   ROS: All systems reviewed and negative except as per HPI.   Current Outpatient Prescriptions  Medication Sig Dispense Refill  . albuterol (VENTOLIN HFA) 108 (90 BASE) MCG/ACT inhaler Inhale 2 puffs into the lungs every 6 (six) hours as needed. For wheezing      . amLODipine (NORVASC) 5 MG tablet Take 5 mg by mouth 2 (two) times daily.      . carbamazepine (TEGRETOL XR) 200 MG 12 hr tablet Take 1 tablet (200 mg total) by mouth 2 (two)  times daily. Take at night only for 7 days then increase to twice daily  60 tablet  3  . citalopram (CELEXA) 20 MG tablet Take 20 mg by mouth daily.       . diclofenac sodium (VOLTAREN) 1 % GEL Apply 1 application topically 4 (four) times daily.  3 Tube  4  . furosemide (LASIX) 40 MG tablet 2 tabs in the AM and 1 tabs in the PM  90 tablet  3  . HYDROcodone-acetaminophen (NORCO) 5-325 MG per tablet Take 1 tablet by mouth every 6 (six) hours as needed. For pain  75 tablet  1  . insulin aspart protamine-insulin aspart (NOVOLOG 70/30) (70-30) 100 UNIT/ML injection Inject 15 Units into the skin 2 (two) times daily with a meal.       . metFORMIN (GLUMETZA) 1000 MG (MOD) 24 hr tablet Take 1,000 mg by mouth 2 (two) times daily with a meal.       . methocarbamol (ROBAXIN) 500 MG tablet Take 500 mg by mouth every 6 (six) hours as needed. For muscle spasm      . metoprolol succinate (TOPROL-XL) 100 MG 24 hr tablet Take 1 and 1/2 tablet in AM and 1 tablet in the Evening.      . NON FORMULARY Take 1 capsule by mouth. (  Body, Hair, Skin, & Nails Supplement )      . Omega-3 Fatty Acids (FISH OIL) 1000 MG CAPS Take 2 capsules (2,000 mg total) by mouth daily.  60 capsule  3  . pantoprazole (PROTONIX) 40 MG tablet Take 40 mg by mouth daily.       . potassium chloride (K-DUR) 10 MEQ tablet Take 1 tablet (10 mEq total) by mouth daily.  90 tablet  3  . sulfamethoxazole-trimethoprim (BACTRIM DS) 800-160 MG per tablet Take 1 tablet by mouth once.       [name] aspirin EC 81 MG tablet Take 1 tablet (81 mg total) by mouth daily.      . cloNIDine (CATAPRES) 0.2 MG tablet Take 1 tablet (0.2 mg total) by mouth 2 (two) times daily.  60 tablet  6  . spironolactone (ALDACTONE) 25 MG tablet Take 1 tablet (25 mg total) by mouth daily.  30 tablet  6    BP 180/90  Pulse 72  Ht 5\' 9"  (1.753 m)  Wt 132.45 kg (292 lb)  BMI 43.12 kg/m2 General: NAD, obese.  Neck: JVP 6-7 cm, no thyromegaly or thyroid nodule.  Lungs: Clear to  auscultation bilaterally with normal respiratory effort. CV: Nondisplaced PMI.  Heart regular S1/S2, no S3/S4, no murmur.  Trace ankle edema.  No carotid bruit.  Normal pedal pulses.  Abdomen: Soft, nontender, no hepatosplenomegaly, no distention.  Neurologic: Alert and oriented x 3.  Psych: Normal affect. Extremities: No clubbing or cyanosis.

## 2012-06-12 NOTE — Assessment & Plan Note (Signed)
Weight is up but do not think that she is retaining significant fluid.  I will continue the current dose of Lasix.

## 2012-06-12 NOTE — Assessment & Plan Note (Signed)
Needs to work hard on weight loss.  We talked about dietary changes.  I also encouraged her to get more exercise, possibly doing water aerobics given her knee and back pain.

## 2012-06-16 ENCOUNTER — Other Ambulatory Visit: Payer: Medicaid Other

## 2012-06-20 ENCOUNTER — Ambulatory Visit (HOSPITAL_COMMUNITY)
Admission: RE | Admit: 2012-06-20 | Discharge: 2012-06-20 | Disposition: A | Discharge status: Home / Self Care | Payer: Medicaid Other | Source: Ambulatory Visit | Attending: Internal Medicine | Admitting: Internal Medicine

## 2012-06-20 ENCOUNTER — Telehealth: Payer: Self-pay | Admitting: Cardiology

## 2012-06-20 DIAGNOSIS — Z1231 Encounter for screening mammogram for malignant neoplasm of breast: Secondary | ICD-10-CM

## 2012-06-20 NOTE — Telephone Encounter (Signed)
New Problem:   [name]:  Called because when going to have her appointment with Dr. Shelle Iron for her PFT they requested that we call a referral over to there office.  Please call back.   Refill  Patient claims that she has not received her spironolactone (ALDACTONE) 25 MG tablet and will need another order sent in to the pharmacy listed on file. Please call once the order has been filled.

## 2012-06-21 ENCOUNTER — Other Ambulatory Visit: Payer: Self-pay | Admitting: *Deleted

## 2012-06-21 DIAGNOSIS — I5032 Chronic diastolic (congestive) heart failure: Secondary | ICD-10-CM

## 2012-06-21 DIAGNOSIS — I10 Essential (primary) hypertension: Secondary | ICD-10-CM

## 2012-06-21 MED ORDER — SPIRONOLACTONE 25 MG PO TABS: 25.0000 mg | ORAL_TABLET | Freq: Every day | ORAL | Status: DC

## 2012-06-21 NOTE — Telephone Encounter (Signed)
[name], CMA 06/21/2012 12:10 PM Signed  [name] in reference to the PFT we are not the ones ordering the PFT's. [name], PAC told pt that she needed to f/u with her PCP about her breathing and he suggested that she may need to have PFT's done, but the PCP is to decide this not [place], so we do not need to send a referral over for to Dr. Shelle Iron office and that it should be coming from PCP. Please f/u back up with Dr. Shelle Iron office and let them know that pt was instructed to f/u w/PCP about breathing problems. Paulene Floor 06/20/2012 12:16 PM Signed  New Problem:  [name]:  Called because when going to have her appointment with Dr. Shelle Iron for her PFT they requested that we call a referral over to there office. Please call back.

## 2012-06-21 NOTE — Telephone Encounter (Signed)
[name] in reference to the PFT we are not the ones ordering the PFT's. [name], PAC told pt that she needed to f/u with her PCP about her breathing and he suggested that she may need to have PFT's done, but the PCP is to decide this not [place], so we do not need to send a referral over for to Dr. Shelle Iron office and that it should be coming from PCP. Please f/u back up with Dr. Shelle Iron office and let them know that pt was instructed to f/u w/PCP about breathing problems.

## 2012-06-21 NOTE — Telephone Encounter (Signed)
rx sent in today

## 2012-06-22 NOTE — Telephone Encounter (Signed)
Patient has been informed.

## 2012-07-03 ENCOUNTER — Ambulatory Visit: Payer: Medicaid Other | Admitting: Physician Assistant

## 2012-07-17 ENCOUNTER — Encounter: Payer: Medicaid Other | Attending: Physical Medicine & Rehabilitation | Admitting: Physical Medicine & Rehabilitation

## 2012-07-17 ENCOUNTER — Encounter: Payer: Self-pay | Admitting: Physical Medicine & Rehabilitation

## 2012-07-17 VITALS — BP 185/108 | HR 99 | Resp 14 | Ht 69.0 in | Wt 290.4 lb

## 2012-07-17 DIAGNOSIS — E1142 Type 2 diabetes mellitus with diabetic polyneuropathy: Secondary | ICD-10-CM

## 2012-07-17 DIAGNOSIS — I10 Essential (primary) hypertension: Secondary | ICD-10-CM

## 2012-07-17 DIAGNOSIS — I509 Heart failure, unspecified: Secondary | ICD-10-CM | POA: Insufficient documentation

## 2012-07-17 DIAGNOSIS — E1149 Type 2 diabetes mellitus with other diabetic neurological complication: Secondary | ICD-10-CM

## 2012-07-17 DIAGNOSIS — M1710 Unilateral primary osteoarthritis, unspecified knee: Secondary | ICD-10-CM

## 2012-07-17 DIAGNOSIS — E669 Obesity, unspecified: Secondary | ICD-10-CM

## 2012-07-17 DIAGNOSIS — M47816 Spondylosis without myelopathy or radiculopathy, lumbar region: Secondary | ICD-10-CM

## 2012-07-17 DIAGNOSIS — G609 Hereditary and idiopathic neuropathy, unspecified: Secondary | ICD-10-CM

## 2012-07-17 DIAGNOSIS — I5032 Chronic diastolic (congestive) heart failure: Secondary | ICD-10-CM

## 2012-07-17 DIAGNOSIS — M47817 Spondylosis without myelopathy or radiculopathy, lumbosacral region: Secondary | ICD-10-CM

## 2012-07-17 MED ORDER — HYDROCODONE-ACETAMINOPHEN 5-325 MG PO TABS: 1.0000 | ORAL_TABLET | Freq: Four times a day (QID) | ORAL | Status: DC | PRN

## 2012-07-17 NOTE — Patient Instructions (Signed)
Continue with your exercising. Pursue non-impact exercise as able.  Perform exercise so that you become slightly winded when you work out.

## 2012-07-17 NOTE — Progress Notes (Signed)
Subjective:    Patient ID: [name], female    DOB: Dec 23, 1965, 46 y.o.   MRN: [id number]  HPI  Merilynn is back regarding her pain complaints. Overall she has been feeling better. She was diuresed further by Dr. Jearld Pies and she feels like it has helped. She is having some cramping in her calves early in the morning and at night. She has lost about 10 lbs over the last month or two.  She complains of occasional shoulder pain.   She is trying to walk when she can. She is about to start water aerobics.    Pain Inventory Average Pain 7 Pain Right Now 6 My pain is dull, stabbing and aching  In the last 24 hours, has pain interfered with the following? General activity 6 Relation with others 6 Enjoyment of life 4 What TIME of day is your pain at its worst? morning and night Sleep (in general) Fair  Pain is worse with: walking, bending, sitting and standing Pain improves with: rest, pacing activities and medication Relief from Meds: 3  Mobility walk without assistance how many minutes can you walk? 8-10 ability to climb steps?  yes do you drive?  no  Function I need assistance with the following:  meal prep, household duties and shopping  Neuro/Psych weakness numbness tingling trouble walking spasms  Prior Studies Any changes since last visit?  no  Physicians involved in your care Any changes since last visit?  no   Family History  Problem Relation Age of Onset  . Heart disease Mother   . Heart disease Paternal Grandmother   . Heart disease Paternal Grandfather   . Stroke Paternal Grandfather   . Heart disease Father    History   Social History  . Marital Status: Single    Spouse Name: N/A    Number of Children: 1  . Years of Education: N/A   Occupational History  . unemployed    Social History Main Topics  . Smoking status: Never Smoker   . Smokeless tobacco: Never Used  . Alcohol Use: No  . Drug Use: No  . Sexually Active: None   Other  Topics Concern  . None   Social History Narrative   Lives with son.    Past Surgical History  Procedure Date  . Breast biopsies   . Tubal ligation 05/31/1985  . Hand surgery     Left  . Shoulder surgery     right  . Dental surgery     tumor removed    Past Medical History  Diagnosis Date  . Hypertension     Poorly controlled. Has had HTN since age 72. Angioedema with ACEI.  24 Hr urine and renal arterial dopplers ordered . . . Never done  . Diabetes mellitus, type 2     HbA1C  11.1 8/12  . NICM (nonischemic cardiomyopathy)     EF 45-50% in 8/12, cath 6/12 showed normal coronaries, EF 50-55% by LV gram  . Morbid obesity   . Allergic rhinitis   . HLD (hyperlipidemia)     LDL 110.9 in 9/12  . OSA (obstructive sleep apnea)     sleep study in 8/12 showed moderate to severe OSA requiring CPAP  . Chronic lower back pain     secondary to DJD, obseity, hip problems. Sees Dr. Ivory Broad  . 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 Chronic diastolic heart failure      Primarily  diastolic CHF: Likely due to uncontrolled HTN. Last echo (8/12) with EF 45-50%, mild to moderate LVH with some asymmetric septal hypertrophy, RV normal size and systolic function. EF 50-55% by LV-gram in 6/12.   . Degeneration of lumbar or lumbosacral intervertebral disc   . Polyneuropathy in diabetes   . Thoracic or lumbosacral neuritis or radiculitis, unspecified   . Calcifying tendinitis of shoulder   . LBBB (left bundle branch block)    BP 185/108  Pulse 99  Resp 14  Ht 5\' 9"  (1.753 m)  Wt 290 lb 6.4 oz (131.725 kg)  BMI 42.88 kg/m2  SpO2 99%  LMP 06/12/2012   Review of Systems  Cardiovascular: Positive for leg swelling.  Musculoskeletal: Positive for back pain.       Muscle cramps, especially in calves. Shoulder pain  Neurological: Positive for weakness and numbness.       Tingling  All other systems reviewed and are negative.       Objective:   Physical  Exam Constitutional: She is oriented to person, place, and time. She appears well-developed and well-nourished.  obese  HENT:  Head: Normocephalic and atraumatic.  Nose: Nose normal.  Eyes: Conjunctivae and EOM are normal. Pupils are equal, round, and reactive to light.  Neck: Normal range of motion.  Cardiovascular: Normal rate and regular rhythm.  Pulmonary/Chest: Effort normal and breath sounds normal.  Abdominal: She exhibits no distension. There is no tenderness.  Musculoskeletal:  Lumbar back: She exhibits decreased range of motion, tenderness, bony tenderness, pain and spasm.  1+ pitting edema both lower ext (decreased).  Mild joint line tenderness laterally on the left. Meniscal maneuvers are positive.   Both calves are tender with stretching and slightly with palpation.    Neurological: She is alert and oriented to person, place, and time. She has normal strength and normal reflexes. A sensory deficit (stocking glove both legs) is present. She displays a negative Romberg sign.  Psychiatric: She has a normal mood and affect. Her behavior is normal. Judgment and thought content normal.    Assessment & Plan:   ASSESSMENT:  1. Chronic low back pain which is multifactorial. Recent MRI of the  back is notable for disc disease and facet hypertrophy. Most  involved level appeared to be L4-L5, where there is a shallow broad-  based disk herniation and facet arthropathy with mild stenosis.  2. Diabetic peripheral neuropathy.  3. Bilateral rotator cuff syndrome.  4. Morbid obesity.  5. Cardiomyopathy/HTN  6. 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   PLAN:  1.Continue with tegretol as it has seemed to help her neuropathic pain. 2. I refilled her hydrocodone #75, 5/325.  3. Encouraged activity to tolerance. I fully support water aerobics or a stationary bike in addition to walking. I discussed taking a multivitamin with Ca++, K+, Mg++ as well as stretching her calves to  help with the cramping. 4. She is working on better glycemic control.  5. Cardiac workup and mgt per Dr. Jearld Pies. She looks good today from that standpoint.  6. Voltaren gel for left knee. This seems to have helped. 7. I will see her back in 3 months' time. She will call me with any  problems or questions

## 2012-08-25 ENCOUNTER — Ambulatory Visit: Payer: Medicaid Other | Admitting: Pulmonary Disease

## 2012-08-28 ENCOUNTER — Ambulatory Visit: Payer: Medicaid Other | Admitting: Cardiology

## 2012-08-29 ENCOUNTER — Emergency Department (HOSPITAL_COMMUNITY): Payer: Medicaid Other

## 2012-08-29 ENCOUNTER — Emergency Department (HOSPITAL_COMMUNITY)
Admission: EM | Admit: 2012-08-29 | Discharge: 2012-08-30 | Discharge status: Against Medical Advice | Payer: Medicaid Other | Attending: Emergency Medicine | Admitting: Emergency Medicine

## 2012-08-29 DIAGNOSIS — R079 Chest pain, unspecified: Secondary | ICD-10-CM | POA: Insufficient documentation

## 2012-08-29 DIAGNOSIS — R209 Unspecified disturbances of skin sensation: Secondary | ICD-10-CM | POA: Insufficient documentation

## 2012-08-29 DIAGNOSIS — M79609 Pain in unspecified limb: Secondary | ICD-10-CM | POA: Insufficient documentation

## 2012-08-29 LAB — CBC WITH DIFFERENTIAL/PLATELET
Basophils Absolute: 0 10*3/uL (ref 0.0–0.1)
Basophils Relative: 0 % (ref 0–1)
Eosinophils Relative: 2 % (ref 0–5)
HCT: 38.7 % (ref 36.0–46.0)
MCHC: 33.6 g/dL (ref 30.0–36.0)
MCV: 83.2 fL (ref 78.0–100.0)
Monocytes Absolute: 0.6 10*3/uL (ref 0.1–1.0)
Neutro Abs: 4.1 10*3/uL (ref 1.7–7.7)
Platelets: 236 10*3/uL (ref 150–400)
RDW: 12.5 % (ref 11.5–15.5)

## 2012-08-29 LAB — BASIC METABOLIC PANEL
BUN: 7 mg/dL (ref 6–23)
Chloride: 95 mEq/L — ABNORMAL LOW (ref 96–112)
GFR calc Af Amer: >90 mL/min (ref >90)
GFR calc non Af Amer: >90 mL/min (ref >90)
Potassium: 3.7 mEq/L (ref 3.5–5.1)
Sodium: 132 mEq/L — ABNORMAL LOW (ref 135–145)

## 2012-08-29 LAB — POCT I-STAT TROPONIN I: Troponin i, poc: 0 ng/mL (ref 0.00–0.08)

## 2012-08-29 NOTE — ED Notes (Signed)
Pt states since Thursday she has not felt well. Pt with multiple complaints. Reports abnormal period. Reports tingling pin and needle pain to back. Reports pain to back of legs. Today reports left sided chest pain with sob, diaphoresis, nausea, dizziness, and weakness. Pt reports swelling to calfs and abdomen.

## 2012-08-31 ENCOUNTER — Ambulatory Visit: Payer: Medicaid Other | Admitting: Pulmonary Disease

## 2012-09-21 ENCOUNTER — Encounter: Payer: Self-pay | Admitting: Pulmonary Disease

## 2012-09-21 ENCOUNTER — Ambulatory Visit (INDEPENDENT_AMBULATORY_CARE_PROVIDER_SITE_OTHER): Payer: Medicaid Other | Admitting: Pulmonary Disease

## 2012-09-21 VITALS — BP 152/108 | HR 94 | Temp 98.0°F | Ht 69.0 in | Wt 290.0 lb

## 2012-09-21 DIAGNOSIS — G4733 Obstructive sleep apnea (adult) (pediatric): Secondary | ICD-10-CM

## 2012-09-21 NOTE — Patient Instructions (Addendum)
Will get a download off your machine to make sure you are not having breakthru apnea, and also to check on mask leak.  Will call you once I get this. Think about trying a different nasal mask if your current one is bothering you. Work on weight loss followup with me in one year if doing well.

## 2012-09-21 NOTE — Assessment & Plan Note (Signed)
The patient has mild obstructive sleep apnea, and tells me that she is wearing CPAP regularly.  She is having some issues with her nasal pillows, and perhaps she should consider a different type of mask.  I also think a lot of her sleepiness during the day is secondary to her sedating medications.  I will get a download off her current machine to make sure she is not having breakthrough of support excessive mask leak.  I have also encouraged her to work aggressively on weight loss.

## 2012-09-21 NOTE — Addendum Note (Signed)
Addended by: Charlott Holler on: 09/21/2012 01:57 PM   Modules accepted: Orders

## 2012-09-21 NOTE — Progress Notes (Signed)
  Subjective:    Patient ID: [name], female    DOB: Jan 02, 1966, 46 y.o.   MRN: [id number]  HPI Patient comes in today for followup of her known obstructive sleep apnea.  She tells me that she is wearing CPAP compliantly, and is having no issues with her pressure.  She is having a little bit of an issue with her nasal pillows, and perhaps she needs to try a different type of mask.  She is not feeling poorly rested in the mornings upon arising, and notes significant daytime fatigue and some sleepiness with inactivity.  She is noted to have a lot of sedating medications that she takes during the day.   Review of Systems  Constitutional: Negative for fever and unexpected weight change.  HENT: Positive for rhinorrhea, sneezing and postnasal drip. Negative for ear pain, nosebleeds, congestion, sore throat, trouble swallowing, dental problem and sinus pressure.   Eyes: Negative for redness and itching.  Respiratory: Positive for chest tightness, shortness of breath and wheezing. Negative for cough.   Cardiovascular: Positive for palpitations and leg swelling.  Gastrointestinal: Negative for nausea and vomiting.  Genitourinary: Negative for dysuria.  Musculoskeletal: Positive for joint swelling.  Skin: Negative for rash.  Neurological: Positive for headaches.  Hematological: Does not bruise/bleed easily.  Psychiatric/Behavioral: Negative for dysphoric mood. The patient is not nervous/anxious.        Objective:   Physical Exam Obese female in no acute distress No skin breakdown or pressure necrosis from the CPAP mask Neck without lymphadenopathy or thyromegaly Lower extremities with edema noted, no cyanosis Alert and oriented, moves all 4 extremities.       Assessment & Plan:

## 2012-10-17 ENCOUNTER — Encounter: Payer: Medicaid Other | Attending: Physical Medicine & Rehabilitation | Admitting: Physical Medicine & Rehabilitation

## 2012-10-17 DIAGNOSIS — I509 Heart failure, unspecified: Secondary | ICD-10-CM | POA: Insufficient documentation

## 2012-10-17 DIAGNOSIS — G609 Hereditary and idiopathic neuropathy, unspecified: Secondary | ICD-10-CM | POA: Insufficient documentation

## 2012-10-17 DIAGNOSIS — I5032 Chronic diastolic (congestive) heart failure: Secondary | ICD-10-CM | POA: Insufficient documentation

## 2012-10-17 DIAGNOSIS — M47817 Spondylosis without myelopathy or radiculopathy, lumbosacral region: Secondary | ICD-10-CM | POA: Insufficient documentation

## 2012-10-17 DIAGNOSIS — E669 Obesity, unspecified: Secondary | ICD-10-CM | POA: Insufficient documentation

## 2012-10-17 DIAGNOSIS — E1142 Type 2 diabetes mellitus with diabetic polyneuropathy: Secondary | ICD-10-CM | POA: Insufficient documentation

## 2012-10-17 DIAGNOSIS — E1149 Type 2 diabetes mellitus with other diabetic neurological complication: Secondary | ICD-10-CM | POA: Insufficient documentation

## 2012-11-16 ENCOUNTER — Emergency Department (HOSPITAL_COMMUNITY): Payer: Medicaid Other

## 2012-11-16 ENCOUNTER — Emergency Department (HOSPITAL_COMMUNITY)
Admission: EM | Admit: 2012-11-16 | Discharge: 2012-11-17 | Disposition: A | Discharge status: Home / Self Care | Payer: Medicaid Other | Attending: Emergency Medicine | Admitting: Emergency Medicine

## 2012-11-16 ENCOUNTER — Encounter (HOSPITAL_COMMUNITY): Payer: Self-pay | Admitting: *Deleted

## 2012-11-16 DIAGNOSIS — Y92410 Unspecified street and highway as the place of occurrence of the external cause: Secondary | ICD-10-CM | POA: Insufficient documentation

## 2012-11-16 DIAGNOSIS — S59909A Unspecified injury of unspecified elbow, initial encounter: Secondary | ICD-10-CM | POA: Insufficient documentation

## 2012-11-16 DIAGNOSIS — S63501A Unspecified sprain of right wrist, initial encounter: Secondary | ICD-10-CM

## 2012-11-16 DIAGNOSIS — E1142 Type 2 diabetes mellitus with diabetic polyneuropathy: Secondary | ICD-10-CM | POA: Insufficient documentation

## 2012-11-16 DIAGNOSIS — R739 Hyperglycemia, unspecified: Secondary | ICD-10-CM

## 2012-11-16 DIAGNOSIS — Z9889 Other specified postprocedural states: Secondary | ICD-10-CM | POA: Insufficient documentation

## 2012-11-16 DIAGNOSIS — Z79899 Other long term (current) drug therapy: Secondary | ICD-10-CM | POA: Insufficient documentation

## 2012-11-16 DIAGNOSIS — I2589 Other forms of chronic ischemic heart disease: Secondary | ICD-10-CM | POA: Insufficient documentation

## 2012-11-16 DIAGNOSIS — G4733 Obstructive sleep apnea (adult) (pediatric): Secondary | ICD-10-CM | POA: Insufficient documentation

## 2012-11-16 DIAGNOSIS — I447 Left bundle-branch block, unspecified: Secondary | ICD-10-CM | POA: Insufficient documentation

## 2012-11-16 DIAGNOSIS — Z7982 Long term (current) use of aspirin: Secondary | ICD-10-CM | POA: Insufficient documentation

## 2012-11-16 DIAGNOSIS — IMO0002 Reserved for concepts with insufficient information to code with codable children: Secondary | ICD-10-CM | POA: Insufficient documentation

## 2012-11-16 DIAGNOSIS — S63599A Other specified sprain of unspecified wrist, initial encounter: Secondary | ICD-10-CM | POA: Insufficient documentation

## 2012-11-16 DIAGNOSIS — E1169 Type 2 diabetes mellitus with other specified complication: Secondary | ICD-10-CM | POA: Insufficient documentation

## 2012-11-16 DIAGNOSIS — Y93I9 Activity, other involving external motion: Secondary | ICD-10-CM | POA: Insufficient documentation

## 2012-11-16 DIAGNOSIS — I10 Essential (primary) hypertension: Secondary | ICD-10-CM | POA: Insufficient documentation

## 2012-11-16 DIAGNOSIS — S6990XA Unspecified injury of unspecified wrist, hand and finger(s), initial encounter: Secondary | ICD-10-CM | POA: Insufficient documentation

## 2012-11-16 DIAGNOSIS — I5032 Chronic diastolic (congestive) heart failure: Secondary | ICD-10-CM | POA: Insufficient documentation

## 2012-11-16 DIAGNOSIS — M5106 Intervertebral disc disorders with myelopathy, lumbar region: Secondary | ICD-10-CM | POA: Insufficient documentation

## 2012-11-16 DIAGNOSIS — E785 Hyperlipidemia, unspecified: Secondary | ICD-10-CM | POA: Insufficient documentation

## 2012-11-16 DIAGNOSIS — Z794 Long term (current) use of insulin: Secondary | ICD-10-CM | POA: Insufficient documentation

## 2012-11-16 DIAGNOSIS — S43401A Unspecified sprain of right shoulder joint, initial encounter: Secondary | ICD-10-CM

## 2012-11-16 NOTE — ED Notes (Signed)
Applied [place] J collar to pt's neck.

## 2012-11-16 NOTE — ED Notes (Signed)
PA at bedside. SCHINLEVER, CATHERINE E
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
## 2012-11-16 NOTE — ED Notes (Signed)
Patient transported to X-ray 
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
## 2012-11-17 LAB — GLUCOSE, CAPILLARY: Glucose-Capillary: 265 mg/dL — ABNORMAL HIGH (ref 70–99)

## 2012-11-18 NOTE — ED Provider Notes (Signed)
Medical screening examination/treatment/procedure(s) were performed by non-physician practitioner and as supervising physician I was immediately available for consultation/collaboration.  Maurisa Tesmer, MD 11/18/12 0508 

## 2012-12-05 ENCOUNTER — Encounter (HOSPITAL_COMMUNITY): Payer: Self-pay | Admitting: *Deleted

## 2012-12-05 DIAGNOSIS — R0789 Other chest pain: Secondary | ICD-10-CM | POA: Diagnosis present

## 2012-12-05 DIAGNOSIS — M545 Low back pain, unspecified: Secondary | ICD-10-CM | POA: Diagnosis present

## 2012-12-05 DIAGNOSIS — I509 Heart failure, unspecified: Secondary | ICD-10-CM | POA: Diagnosis present

## 2012-12-05 DIAGNOSIS — I5033 Acute on chronic diastolic (congestive) heart failure: Principal | ICD-10-CM | POA: Diagnosis present

## 2012-12-05 DIAGNOSIS — I428 Other cardiomyopathies: Secondary | ICD-10-CM | POA: Diagnosis present

## 2012-12-05 DIAGNOSIS — G8929 Other chronic pain: Secondary | ICD-10-CM | POA: Diagnosis present

## 2012-12-05 DIAGNOSIS — E785 Hyperlipidemia, unspecified: Secondary | ICD-10-CM | POA: Diagnosis present

## 2012-12-05 DIAGNOSIS — E1142 Type 2 diabetes mellitus with diabetic polyneuropathy: Secondary | ICD-10-CM | POA: Diagnosis present

## 2012-12-05 DIAGNOSIS — N289 Disorder of kidney and ureter, unspecified: Secondary | ICD-10-CM | POA: Diagnosis present

## 2012-12-05 DIAGNOSIS — Z7982 Long term (current) use of aspirin: Secondary | ICD-10-CM

## 2012-12-05 DIAGNOSIS — G4733 Obstructive sleep apnea (adult) (pediatric): Secondary | ICD-10-CM | POA: Diagnosis present

## 2012-12-05 DIAGNOSIS — E876 Hypokalemia: Secondary | ICD-10-CM | POA: Diagnosis present

## 2012-12-05 DIAGNOSIS — Z794 Long term (current) use of insulin: Secondary | ICD-10-CM

## 2012-12-05 DIAGNOSIS — Z6841 Body Mass Index (BMI) 40.0 and over, adult: Secondary | ICD-10-CM

## 2012-12-05 DIAGNOSIS — I10 Essential (primary) hypertension: Secondary | ICD-10-CM | POA: Diagnosis present

## 2012-12-05 DIAGNOSIS — E1149 Type 2 diabetes mellitus with other diabetic neurological complication: Secondary | ICD-10-CM | POA: Diagnosis present

## 2012-12-05 NOTE — ED Notes (Signed)
The pt has very cold hands

## 2012-12-05 NOTE — ED Notes (Signed)
The pt is c/o mid-chest pain for 2 days with dizziness and sob.  She is currently hyperventilating.  lmp  Dec 9th

## 2012-12-06 ENCOUNTER — Emergency Department (HOSPITAL_COMMUNITY): Payer: Medicaid Other

## 2012-12-06 ENCOUNTER — Encounter (HOSPITAL_COMMUNITY): Payer: Self-pay | Admitting: *Deleted

## 2012-12-06 ENCOUNTER — Inpatient Hospital Stay (HOSPITAL_COMMUNITY)
Admission: EM | Admit: 2012-12-06 | Discharge: 2012-12-10 | DRG: 292 | Disposition: A | Discharge status: Home / Self Care | Payer: Medicaid Other | Attending: Family Medicine | Admitting: Family Medicine

## 2012-12-06 DIAGNOSIS — N289 Disorder of kidney and ureter, unspecified: Secondary | ICD-10-CM

## 2012-12-06 DIAGNOSIS — I10 Essential (primary) hypertension: Secondary | ICD-10-CM

## 2012-12-06 DIAGNOSIS — R0602 Shortness of breath: Secondary | ICD-10-CM

## 2012-12-06 DIAGNOSIS — E785 Hyperlipidemia, unspecified: Secondary | ICD-10-CM

## 2012-12-06 DIAGNOSIS — E1140 Type 2 diabetes mellitus with diabetic neuropathy, unspecified: Secondary | ICD-10-CM | POA: Diagnosis present

## 2012-12-06 DIAGNOSIS — E876 Hypokalemia: Secondary | ICD-10-CM

## 2012-12-06 DIAGNOSIS — I509 Heart failure, unspecified: Secondary | ICD-10-CM

## 2012-12-06 DIAGNOSIS — E1142 Type 2 diabetes mellitus with diabetic polyneuropathy: Secondary | ICD-10-CM

## 2012-12-06 DIAGNOSIS — E119 Type 2 diabetes mellitus without complications: Secondary | ICD-10-CM

## 2012-12-06 DIAGNOSIS — G4733 Obstructive sleep apnea (adult) (pediatric): Secondary | ICD-10-CM

## 2012-12-06 DIAGNOSIS — I5033 Acute on chronic diastolic (congestive) heart failure: Principal | ICD-10-CM

## 2012-12-06 DIAGNOSIS — I5032 Chronic diastolic (congestive) heart failure: Secondary | ICD-10-CM

## 2012-12-06 DIAGNOSIS — M47816 Spondylosis without myelopathy or radiculopathy, lumbar region: Secondary | ICD-10-CM

## 2012-12-06 DIAGNOSIS — I5042 Chronic combined systolic (congestive) and diastolic (congestive) heart failure: Secondary | ICD-10-CM | POA: Diagnosis present

## 2012-12-06 DIAGNOSIS — E669 Obesity, unspecified: Secondary | ICD-10-CM

## 2012-12-06 DIAGNOSIS — M1712 Unilateral primary osteoarthritis, left knee: Secondary | ICD-10-CM

## 2012-12-06 DIAGNOSIS — Z8679 Personal history of other diseases of the circulatory system: Secondary | ICD-10-CM

## 2012-12-06 LAB — COMPREHENSIVE METABOLIC PANEL
ALT: 29 U/L (ref 0–35)
AST: 31 U/L (ref 0–37)
Albumin: 3.5 g/dL (ref 3.5–5.2)
CO2: 23 mEq/L (ref 19–32)
Calcium: 9 mg/dL (ref 8.4–10.5)
Creatinine, Ser: 0.77 mg/dL (ref 0.50–1.10)
Sodium: 134 mEq/L — ABNORMAL LOW (ref 135–145)

## 2012-12-06 LAB — CBC WITH DIFFERENTIAL/PLATELET
Basophils Absolute: 0 10*3/uL (ref 0.0–0.1)
Basophils Relative: 0 % (ref 0–1)
Eosinophils Relative: 1 % (ref 0–5)
HCT: 39.7 % (ref 36.0–46.0)
Lymphocytes Relative: 22 % (ref 12–46)
MCHC: 32.5 g/dL (ref 30.0–36.0)
MCV: 83.6 fL (ref 78.0–100.0)
Monocytes Absolute: 0.9 10*3/uL (ref 0.1–1.0)
Neutro Abs: 8.8 10*3/uL — ABNORMAL HIGH (ref 1.7–7.7)
Platelets: 240 10*3/uL (ref 150–400)
RDW: 13.1 % (ref 11.5–15.5)
WBC: 12.5 10*3/uL — ABNORMAL HIGH (ref 4.0–10.5)

## 2012-12-06 LAB — CBC
HCT: 38.6 % (ref 36.0–46.0)
Hemoglobin: 12.6 g/dL (ref 12.0–15.0)
MCH: 27.4 pg (ref 26.0–34.0)
MCHC: 32.6 g/dL (ref 30.0–36.0)
MCV: 83.9 fL (ref 78.0–100.0)
Platelets: 238 10*3/uL (ref 150–400)
RBC: 4.6 MIL/uL (ref 3.87–5.11)
RDW: 13.1 % (ref 11.5–15.5)
WBC: 12.9 10*3/uL — ABNORMAL HIGH (ref 4.0–10.5)

## 2012-12-06 LAB — GLUCOSE, CAPILLARY
Glucose-Capillary: 232 mg/dL — ABNORMAL HIGH (ref 70–99)
Glucose-Capillary: 272 mg/dL — ABNORMAL HIGH (ref 70–99)
Glucose-Capillary: 309 mg/dL — ABNORMAL HIGH (ref 70–99)
Glucose-Capillary: 216 mg/dL — ABNORMAL HIGH (ref 70–99)

## 2012-12-06 MED ORDER — HEPARIN SODIUM (PORCINE) 5000 UNIT/ML IJ SOLN
5000.0000 [IU] | Freq: Three times a day (TID) | INTRAMUSCULAR | Status: DC
  Administered 2012-12-06 – 2012-12-10 (×13): 5000 [IU] via SUBCUTANEOUS
  Filled 2012-12-06 (×16): qty 1

## 2012-12-06 MED ORDER — PANTOPRAZOLE SODIUM 40 MG PO TBEC
40.0000 mg | DELAYED_RELEASE_TABLET | Freq: Every day | ORAL | Status: DC
  Administered 2012-12-06 – 2012-12-10 (×5): 40 mg via ORAL
  Filled 2012-12-06 (×3): qty 1

## 2012-12-06 MED ORDER — OXYCODONE-ACETAMINOPHEN 5-325 MG PO TABS: 1.0000 | ORAL_TABLET | ORAL | Status: DC | PRN

## 2012-12-06 MED ORDER — CARBAMAZEPINE ER 200 MG PO TB12
200.0000 mg | ORAL_TABLET | Freq: Two times a day (BID) | ORAL | Status: DC
  Administered 2012-12-06 – 2012-12-10 (×9): 200 mg via ORAL
  Filled 2012-12-06 (×10): qty 1

## 2012-12-06 MED ORDER — SPIRONOLACTONE 25 MG PO TABS
25.0000 mg | ORAL_TABLET | Freq: Every day | ORAL | Status: DC
  Administered 2012-12-06 – 2012-12-10 (×5): 25 mg via ORAL
  Filled 2012-12-06 (×5): qty 1

## 2012-12-06 MED ORDER — OXYCODONE-ACETAMINOPHEN 10-325 MG PO TABS: 1.0000 | ORAL_TABLET | ORAL | Status: DC | PRN

## 2012-12-06 MED ORDER — INSULIN ASPART PROT & ASPART (70-30 MIX) 100 UNIT/ML ~~LOC~~ SUSP
15.0000 [IU] | Freq: Every day | SUBCUTANEOUS | Status: DC
  Administered 2012-12-06: 15 [IU] via SUBCUTANEOUS
  Filled 2012-12-06: qty 10

## 2012-12-06 MED ORDER — ALBUTEROL SULFATE HFA 108 (90 BASE) MCG/ACT IN AERS: 2.0000 | INHALATION_SPRAY | Freq: Four times a day (QID) | RESPIRATORY_TRACT | Status: DC | PRN

## 2012-12-06 MED ORDER — FUROSEMIDE 10 MG/ML IJ SOLN
80.0000 mg | Freq: Two times a day (BID) | INTRAMUSCULAR | Status: DC
  Administered 2012-12-06 – 2012-12-07 (×4): 80 mg via INTRAVENOUS
  Filled 2012-12-06 (×4): qty 8

## 2012-12-06 MED ORDER — SODIUM CHLORIDE 0.9 % IJ SOLN: 3.0000 mL | INTRAMUSCULAR | Status: DC | PRN

## 2012-12-06 MED ORDER — FUROSEMIDE 20 MG PO TABS: 80.0000 mg | ORAL_TABLET | Freq: Two times a day (BID) | ORAL | Status: DC

## 2012-12-06 MED ORDER — NITROGLYCERIN 2 % TD OINT
1.0000 [in_us] | TOPICAL_OINTMENT | Freq: Once | TRANSDERMAL | Status: AC
  Administered 2012-12-06: 1 [in_us] via TOPICAL
  Filled 2012-12-06: qty 1

## 2012-12-06 MED ORDER — METFORMIN HCL ER 500 MG PO TB24: 500.0000 mg | ORAL_TABLET | Freq: Two times a day (BID) | ORAL | Status: DC

## 2012-12-06 MED ORDER — OXYCODONE HCL 5 MG PO TABS: 5.0000 mg | ORAL_TABLET | ORAL | Status: DC | PRN

## 2012-12-06 MED ORDER — INSULIN ASPART 100 UNIT/ML ~~LOC~~ SOLN
10.0000 [IU] | Freq: Once | SUBCUTANEOUS | Status: AC
  Administered 2012-12-06: 10 [IU] via INTRAVENOUS
  Filled 2012-12-06: qty 1

## 2012-12-06 MED ORDER — INSULIN GLARGINE 100 UNIT/ML ~~LOC~~ SOLN
50.0000 [IU] | Freq: Two times a day (BID) | SUBCUTANEOUS | Status: DC
  Administered 2012-12-06 – 2012-12-09 (×7): 50 [IU] via SUBCUTANEOUS

## 2012-12-06 MED ORDER — METHOCARBAMOL 500 MG PO TABS
500.0000 mg | ORAL_TABLET | Freq: Four times a day (QID) | ORAL | Status: DC | PRN
  Filled 2012-12-06: qty 1

## 2012-12-06 MED ORDER — INSULIN ASPART PROT & ASPART (70-30 MIX) 100 UNIT/ML ~~LOC~~ SUSP
25.0000 [IU] | Freq: Every day | SUBCUTANEOUS | Status: DC
  Administered 2012-12-07: 25 [IU] via SUBCUTANEOUS
  Filled 2012-12-06: qty 10

## 2012-12-06 MED ORDER — IBUPROFEN 800 MG PO TABS
800.0000 mg | ORAL_TABLET | Freq: Three times a day (TID) | ORAL | Status: DC
  Administered 2012-12-06 – 2012-12-07 (×5): 800 mg via ORAL
  Filled 2012-12-06 (×9): qty 1

## 2012-12-06 MED ORDER — FUROSEMIDE 10 MG/ML IJ SOLN
80.0000 mg | Freq: Once | INTRAMUSCULAR | Status: AC
  Administered 2012-12-06: 80 mg via INTRAVENOUS
  Filled 2012-12-06: qty 8

## 2012-12-06 MED ORDER — POTASSIUM CHLORIDE ER 10 MEQ PO TBCR
10.0000 meq | EXTENDED_RELEASE_TABLET | Freq: Every day | ORAL | Status: DC
  Administered 2012-12-06 – 2012-12-10 (×5): 10 meq via ORAL
  Filled 2012-12-06 (×5): qty 1

## 2012-12-06 MED ORDER — SODIUM CHLORIDE 0.9 % IV SOLN: 250.0000 mL | INTRAVENOUS | Status: DC | PRN

## 2012-12-06 MED ORDER — SODIUM CHLORIDE 0.9 % IJ SOLN
3.0000 mL | Freq: Two times a day (BID) | INTRAMUSCULAR | Status: DC
  Administered 2012-12-06 – 2012-12-10 (×7): 3 mL via INTRAVENOUS

## 2012-12-06 MED ORDER — METOPROLOL SUCCINATE ER 100 MG PO TB24
100.0000 mg | ORAL_TABLET | Freq: Every day | ORAL | Status: DC
  Administered 2012-12-06 – 2012-12-10 (×5): 100 mg via ORAL
  Filled 2012-12-06 (×5): qty 1

## 2012-12-06 MED ORDER — ASPIRIN EC 81 MG PO TBEC
81.0000 mg | DELAYED_RELEASE_TABLET | Freq: Every day | ORAL | Status: DC
  Administered 2012-12-06 – 2012-12-10 (×5): 81 mg via ORAL
  Filled 2012-12-06 (×5): qty 1

## 2012-12-06 MED ORDER — HYDROCODONE-ACETAMINOPHEN 5-325 MG PO TABS: 1.0000 | ORAL_TABLET | Freq: Four times a day (QID) | ORAL | Status: DC | PRN

## 2012-12-06 MED ORDER — INSULIN ASPART PROT & ASPART (70-30 MIX) 100 UNIT/ML ~~LOC~~ SUSP: 15.0000 [IU] | Freq: Two times a day (BID) | SUBCUTANEOUS | Status: DC

## 2012-12-06 MED ORDER — SODIUM CHLORIDE 0.9 % IJ SOLN
3.0000 mL | Freq: Two times a day (BID) | INTRAMUSCULAR | Status: DC
  Administered 2012-12-06 (×2): 3 mL via INTRAVENOUS

## 2012-12-06 MED ORDER — CITALOPRAM HYDROBROMIDE 20 MG PO TABS
20.0000 mg | ORAL_TABLET | Freq: Every day | ORAL | Status: DC
  Administered 2012-12-06 – 2012-12-10 (×5): 20 mg via ORAL
  Filled 2012-12-06 (×5): qty 1

## 2012-12-06 MED ORDER — CLONIDINE HCL 0.2 MG PO TABS
0.2000 mg | ORAL_TABLET | Freq: Two times a day (BID) | ORAL | Status: DC
  Administered 2012-12-06 – 2012-12-10 (×8): 0.2 mg via ORAL
  Filled 2012-12-06 (×10): qty 1

## 2012-12-06 MED ORDER — ONDANSETRON HCL 4 MG/2ML IJ SOLN
4.0000 mg | Freq: Once | INTRAMUSCULAR | Status: AC
  Administered 2012-12-06: 4 mg via INTRAVENOUS
  Filled 2012-12-06: qty 2
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
## 2012-12-06 NOTE — Progress Notes (Signed)
*  PRELIMINARY RESULTS* Echocardiogram 2D Echocardiogram has been performed.  [name] 12/06/2012, 1:01 PM

## 2012-12-06 NOTE — ED Notes (Signed)
Patient presentation discussed with Dr. Ranae Palms.  Holding nitro and ASA at this time.  MD will advise if it's needed after his examination.
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
## 2012-12-06 NOTE — ED Notes (Signed)
Patient transported to X-ray 

## 2012-12-07 LAB — BASIC METABOLIC PANEL
Chloride: 98 mEq/L (ref 96–112)
Creatinine, Ser: 1.09 mg/dL (ref 0.50–1.10)
GFR calc Af Amer: 69 mL/min — ABNORMAL LOW (ref >90)
Potassium: 3.9 mEq/L (ref 3.5–5.1)

## 2012-12-07 LAB — CBC
Platelets: 235 10*3/uL (ref 150–400)
RDW: 13.3 % (ref 11.5–15.5)
WBC: 9.4 10*3/uL (ref 4.0–10.5)

## 2012-12-07 LAB — HEMOGLOBIN A1C
Hgb A1c MFr Bld: 11.4 % — ABNORMAL HIGH (ref <5.7)
Mean Plasma Glucose: 280 mg/dL — ABNORMAL HIGH (ref <117)

## 2012-12-07 LAB — LIPID PANEL
Cholesterol: 194 mg/dL (ref 0–200)
HDL: 47 mg/dL (ref >39)
Total CHOL/HDL Ratio: 4.1 RATIO

## 2012-12-07 LAB — GLUCOSE, CAPILLARY

## 2012-12-07 MED ORDER — INSULIN ASPART PROT & ASPART (70-30 MIX) 100 UNIT/ML ~~LOC~~ SUSP
20.0000 [IU] | Freq: Every day | SUBCUTANEOUS | Status: DC
  Administered 2012-12-07: 20 [IU] via SUBCUTANEOUS

## 2012-12-07 MED ORDER — GLUCERNA SHAKE PO LIQD: 237.0000 mL | ORAL | Status: DC | PRN

## 2012-12-07 MED ORDER — FUROSEMIDE 80 MG PO TABS
80.0000 mg | ORAL_TABLET | Freq: Two times a day (BID) | ORAL | Status: DC
  Filled 2012-12-07 (×3): qty 1

## 2012-12-07 MED ORDER — INSULIN ASPART 100 UNIT/ML ~~LOC~~ SOLN
0.0000 [IU] | Freq: Three times a day (TID) | SUBCUTANEOUS | Status: DC
  Administered 2012-12-07: 7 [IU] via SUBCUTANEOUS
  Administered 2012-12-08 – 2012-12-09 (×2): 3 [IU] via SUBCUTANEOUS

## 2012-12-07 MED ORDER — INSULIN ASPART PROT & ASPART (70-30 MIX) 100 UNIT/ML ~~LOC~~ SUSP
30.0000 [IU] | Freq: Every day | SUBCUTANEOUS | Status: DC
  Administered 2012-12-08: 30 [IU] via SUBCUTANEOUS

## 2012-12-07 MED ORDER — POLYETHYLENE GLYCOL 3350 17 G PO PACK
17.0000 g | PACK | Freq: Every day | ORAL | Status: DC
  Administered 2012-12-08 – 2012-12-10 (×3): 17 g via ORAL
  Filled 2012-12-07 (×4): qty 1

## 2012-12-07 NOTE — Progress Notes (Signed)
PT refused her CPAP

## 2012-12-07 NOTE — Progress Notes (Signed)
INITIAL NUTRITION ASSESSMENT  DOCUMENTATION CODES Per approved criteria  -Morbid Obesity   INTERVENTION: 1. Glucerna Shake po PRN, each supplement provides 220 kcal and 10 grams of protein. 2. RD will continue to follow     NUTRITION DIAGNOSIS: Inadequate oral intake  related to ?depression as evidenced by weight loss.   Goal: PO intake to meet >/=90% estimated nutrition needs  Monitor:  PO intake, weight trends, labs, I/O's  Reason for Assessment: Malnutrition screening tool   46 y.o. female  Admitting Dx: Acute on chronic diastolic CHF (congestive heart failure)  ASSESSMENT: [name] admitted with increased SOB.  [name] states that she has not been eating well for a while, typically only one meal daily. Thinks it may be related to depression.  [name] at this admission is eating 100% of meals.  Weight variable, unclear dry weight or amount of weight loss. Expect better appetite with fluids off.   Height: Ht Readings from Last 1 Encounters:  12/06/12 5\' 9"  (1.753 m)    Weight: Wt Readings from Last 1 Encounters:  12/07/12 283 lb (128.368 kg)    Ideal Body Weight: 65.9 kg   % Ideal Body Weight: 194%  Wt Readings from Last 10 Encounters:  12/07/12 283 lb (128.368 kg)  09/21/12 290 lb (131.543 kg)  07/17/12 290 lb 6.4 oz (131.725 kg)  06/09/12 292 lb (132.45 kg)  04/24/12 283 lb (128.368 kg)  04/17/12 288 lb 6.4 oz (130.817 kg)  04/14/12 295 lb (133.811 kg)  02/23/12 298 lb 6.4 oz (135.353 kg)  12/21/11 289 lb (131.09 kg)  11/26/11 284 lb 9.8 oz (129.1 kg)    Usual Body Weight: [name] unsure, 290 lbs?   % Usual Body Weight: 98%  BMI:  Body mass index is 41.79 kg/(m^2). Obesity class 3, morbid   Estimated Nutritional Needs: Kcal: 1900-2100 Protein: 85-100 gm  Fluid: 1.0-2.1 L/day  Skin: intact   Diet Order: Carb Control medium   EDUCATION NEEDS: -No education needs identified at this time   Intake/Output Summary (Last 24 hours) at 12/07/12 1305 Last data  filed at 12/07/12 1249  Gross per 24 hour  Intake   1474 ml  Output   2775 ml  Net  -1301 ml    Last BM: PTA   Labs:   Lab 12/07/12 0535 12/06/12 0536 12/05/12 2333  NA 138 -- 134*  K 3.9 -- 4.0  CL 98 -- 98  CO2 30 -- 23  BUN 11 -- 5*  CREATININE 1.09 0.69 0.77  CALCIUM 9.2 -- 9.0  MG -- -- --  PHOS -- -- --  GLUCOSE 220* -- 362*    CBG (last 3)   Basename 12/07/12 1112 12/07/12 0654 12/07/12 0144  GLUCAP 260* 256* 228*    Scheduled Meds:   . aspirin EC  81 mg Oral Daily  . carbamazepine  200 mg Oral BID  . citalopram  20 mg Oral Daily  . cloNIDine  0.2 mg Oral BID  . furosemide  80 mg Intravenous Q12H  . heparin  5,000 Units Subcutaneous Q8H  . ibuprofen  800 mg Oral TID  . insulin aspart protamine-insulin aspart  20 Units Subcutaneous Q supper  . insulin aspart protamine-insulin aspart  30 Units Subcutaneous Q breakfast  . insulin glargine  50 Units Subcutaneous BID  . metoprolol succinate  100 mg Oral Daily  . pantoprazole  40 mg Oral Daily  . potassium chloride  10 mEq Oral Daily  . sodium chloride  3 mL Intravenous Q12H  .  spironolactone  25 mg Oral Daily    Continuous Infusions:   Past Medical History  Diagnosis Date  . Hypertension     Poorly controlled. Has had HTN since age 33. Angioedema with ACEI.  24 Hr urine and renal arterial dopplers ordered . . . Never done  . Diabetes mellitus, type 2     HbA1C  11.1 8/12  . NICM (nonischemic cardiomyopathy)     EF 45-50% in 8/12, cath 6/12 showed normal coronaries, EF 50-55% by LV gram  . Morbid obesity   . Allergic rhinitis   . HLD (hyperlipidemia)     LDL 110.9 in 9/12  . OSA (obstructive sleep apnea)     sleep study in 8/12 showed moderate to severe OSA requiring CPAP  . Chronic lower back pain     secondary to DJD, obseity, hip problems. Sees Dr. Ivory Broad  . 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 Chronic diastolic heart failure      Primarily diastolic CHF: Likely due  to uncontrolled HTN. Last echo (8/12) with EF 45-50%, mild to moderate LVH with some asymmetric septal hypertrophy, RV normal size and systolic function. EF 50-55% by LV-gram in 6/12.   . Degeneration of lumbar or lumbosacral intervertebral disc   . Polyneuropathy in diabetes(357.2)   . Thoracic or lumbosacral neuritis or radiculitis, unspecified   . Calcifying tendinitis of shoulder   . LBBB (left bundle branch block)     Past Surgical History  Procedure Date  . Breast biopsies   . Tubal ligation 05/31/1985  . Hand surgery     Left  . Shoulder surgery     right  . Dental surgery     tumor removed   . Cardiac catheterization     Clarene Duke RD, LDN Pager [phone] After Hours pager [phone]

## 2012-12-07 NOTE — Progress Notes (Signed)
Inpatient Diabetes Program Recommendations  AACE/ADA: New Consensus Statement on Inpatient Glycemic Control (2013)  Target Ranges:  Prepandial:   less than 140 mg/dL      Peak postprandial:   less than 180 mg/dL (1-2 hours)      Critically ill patients:  140 - 180 mg/dL   Reason for Visit: Results for LINDALEE, HUIZINGA (MRN [id number]) as of 12/07/2012 12:58  Ref. Range 12/06/2012 22:00 12/07/2012 01:44 12/07/2012 05:35 12/07/2012 06:54  Glucose-Capillary Latest Range: 70-99 mg/dL 528 (H) 413 (H)  244 (H)    Note CBG's continue to be elevated.  Please add Novolog moderate correction tid with meals and HS. Also please check A1C.
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
## 2012-12-07 NOTE — Care Management Note (Signed)
    Page 1 of 1   12/07/2012     11:50:52 AM   CARE MANAGEMENT NOTE 12/07/2012  Patient:  [name], [name]   Account Number:  [id number]  Date Initiated:  12/07/2012  Documentation initiated by:  Tera Mater  Subjective/Objective Assessment:   46yo female admitted with CHF.  Pt. lives with friend.     Action/Plan:   In to speak with pt. about medication assistance.  Pt. states she is not able to pay for her $3 co-pay for her medications.  Explained to pt. that since she had insurance the hospital could not assist her with medications.   Anticipated DC Date:  12/08/2012   Anticipated DC Plan:  HOME/SELF CARE      DC Planning Services  CM consult  Medication Assistance      Choice offered to / List presented to:             Status of service:  In process, will continue to follow Medicare Important Message given?   (If response is "NO", the following Medicare IM given date fields will be blank) Date Medicare IM given:   Date Additional Medicare IM given:    Discharge Disposition:    Per UR Regulation:  Reviewed for med. necessity/level of care/duration of stay  If discussed at Long Length of Stay Meetings, dates discussed:    Comments:  12/07/12 1146 In to speak with pt. about Medication assistance.  Explained to pt. that since she had insurance, the hospital could not assist her with medications. She stated she understood.  NCM to follow for further discharge needs. Tera Mater, RN, BSN NCM [phone]

## 2012-12-07 NOTE — Progress Notes (Signed)
Utilization Review Completed.   Maeleigh Buschman, RN, BSN Nurse Case Manager  [phone]  

## 2012-12-07 NOTE — Progress Notes (Signed)
Pt. Refused CPAP X2 visits to room by RT.  Pt. Encouraged to call when ready.

## 2012-12-08 DIAGNOSIS — N289 Disorder of kidney and ureter, unspecified: Secondary | ICD-10-CM | POA: Diagnosis not present

## 2012-12-08 DIAGNOSIS — E876 Hypokalemia: Secondary | ICD-10-CM | POA: Diagnosis not present

## 2012-12-08 LAB — BASIC METABOLIC PANEL
BUN: 17 mg/dL (ref 6–23)
Calcium: 9.2 mg/dL (ref 8.4–10.5)
Creatinine, Ser: 1.86 mg/dL — ABNORMAL HIGH (ref 0.50–1.10)
Creatinine, Ser: 1.53 mg/dL — ABNORMAL HIGH (ref 0.50–1.10)
GFR calc Af Amer: 36 mL/min — ABNORMAL LOW (ref >90)
GFR calc Af Amer: 46 mL/min — ABNORMAL LOW (ref >90)
GFR calc non Af Amer: 40 mL/min — ABNORMAL LOW (ref >90)

## 2012-12-08 LAB — GLUCOSE, CAPILLARY: Glucose-Capillary: 120 mg/dL — ABNORMAL HIGH (ref 70–99)

## 2012-12-08 MED ORDER — SODIUM CHLORIDE 0.9 % IV BOLUS (SEPSIS)
500.0000 mL | Freq: Once | INTRAVENOUS | Status: AC
  Administered 2012-12-08: 500 mL via INTRAVENOUS

## 2012-12-08 MED ORDER — ACETAMINOPHEN 500 MG PO TABS
500.0000 mg | ORAL_TABLET | Freq: Three times a day (TID) | ORAL | Status: DC
  Administered 2012-12-08 – 2012-12-10 (×5): 500 mg via ORAL
  Filled 2012-12-08 (×8): qty 1

## 2012-12-08 MED ORDER — SENNA 8.6 MG PO TABS
2.0000 | ORAL_TABLET | Freq: Once | ORAL | Status: AC
  Administered 2012-12-08: 17.2 mg via ORAL
  Filled 2012-12-08: qty 2

## 2012-12-08 MED ORDER — INSULIN ASPART PROT & ASPART (70-30 MIX) 100 UNIT/ML ~~LOC~~ SUSP
20.0000 [IU] | Freq: Every day | SUBCUTANEOUS | Status: DC
  Filled 2012-12-08: qty 3

## 2012-12-08 MED ORDER — INSULIN ASPART PROT & ASPART (70-30 MIX) 100 UNIT/ML ~~LOC~~ SUSP: 25.0000 [IU] | Freq: Every day | SUBCUTANEOUS | Status: DC

## 2012-12-08 MED ORDER — INSULIN ASPART PROT & ASPART (70-30 MIX) 100 UNIT/ML ~~LOC~~ SUSP: 35.0000 [IU] | Freq: Every day | SUBCUTANEOUS | Status: DC

## 2012-12-08 MED ORDER — ONDANSETRON HCL 4 MG/2ML IJ SOLN
4.0000 mg | Freq: Four times a day (QID) | INTRAMUSCULAR | Status: DC | PRN
  Administered 2012-12-08: 4 mg via INTRAVENOUS
  Filled 2012-12-08: qty 2

## 2012-12-08 MED ORDER — POTASSIUM CHLORIDE CRYS ER 20 MEQ PO TBCR
40.0000 meq | EXTENDED_RELEASE_TABLET | ORAL | Status: AC
  Administered 2012-12-08 (×2): 40 meq via ORAL

## 2012-12-08 MED ORDER — INSULIN ASPART PROT & ASPART (70-30 MIX) 100 UNIT/ML ~~LOC~~ SUSP
30.0000 [IU] | Freq: Every day | SUBCUTANEOUS | Status: DC
  Administered 2012-12-09: 30 [IU] via SUBCUTANEOUS

## 2012-12-08 MED ORDER — POTASSIUM CHLORIDE CRYS ER 20 MEQ PO TBCR
40.0000 meq | EXTENDED_RELEASE_TABLET | Freq: Once | ORAL | Status: AC
  Administered 2012-12-08: 40 meq via ORAL
  Filled 2012-12-08: qty 2
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
## 2012-12-08 NOTE — Progress Notes (Signed)
Inpatient Diabetes Program Recommendations  AACE/ADA: New Consensus Statement on Inpatient Glycemic Control (2013)  Target Ranges:  Prepandial:   less than 140 mg/dL      Peak postprandial:   less than 180 mg/dL (1-2 hours)      Critically ill patients:  140 - 180 mg/dL   Reason for Visit: Previous hyperglycemia and elevated A1C at 11.4% Glucose is improved with addition of resistant correction insulin tidwc. Would continue to increase the 70/30 dosages until control is reached. Pt may also benefit from addition of Byetta at home, 10 mcg bid.   Note: Thank you, Lenor Coffin, RN, CNS, Diabetes Coordinator [phone])

## 2012-12-08 NOTE — Progress Notes (Signed)
DR Janee Morn notified that pt's CBG was 98. Received order to hold this time dose of 70/30 insulin. Marisa Cyphers RN

## 2012-12-09 LAB — BASIC METABOLIC PANEL
CO2: 29 mEq/L (ref 19–32)
GFR calc non Af Amer: 62 mL/min — ABNORMAL LOW (ref >90)
Glucose, Bld: 82 mg/dL (ref 70–99)
Potassium: 3.8 mEq/L (ref 3.5–5.1)
Sodium: 135 mEq/L (ref 135–145)

## 2012-12-09 LAB — GLUCOSE, CAPILLARY
Glucose-Capillary: 77 mg/dL (ref 70–99)
Glucose-Capillary: 133 mg/dL — ABNORMAL HIGH (ref 70–99)

## 2012-12-09 MED ORDER — METHOCARBAMOL 500 MG PO TABS
500.0000 mg | ORAL_TABLET | Freq: Two times a day (BID) | ORAL | Status: DC
  Administered 2012-12-09 – 2012-12-10 (×2): 500 mg via ORAL
  Filled 2012-12-09 (×3): qty 1

## 2012-12-09 MED ORDER — INSULIN GLARGINE 100 UNIT/ML ~~LOC~~ SOLN
60.0000 [IU] | Freq: Two times a day (BID) | SUBCUTANEOUS | Status: DC
Start: 2012-12-09 — End: 2012-12-10
  Administered 2012-12-09 – 2012-12-10 (×2): 60 [IU] via SUBCUTANEOUS

## 2012-12-09 MED ORDER — FUROSEMIDE 40 MG PO TABS
40.0000 mg | ORAL_TABLET | Freq: Two times a day (BID) | ORAL | Status: DC
  Administered 2012-12-09 – 2012-12-10 (×2): 40 mg via ORAL
  Filled 2012-12-09 (×4): qty 1
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
## 2012-12-09 NOTE — Progress Notes (Signed)
1700 cbg 79  No hypoglycemia . Ate fdnner sufficiently. [name] Referred to Dr. Mahala Menghini . With orders

## 2012-12-10 LAB — GLUCOSE, CAPILLARY: Glucose-Capillary: 99 mg/dL (ref 70–99)

## 2012-12-10 MED ORDER — FUROSEMIDE 40 MG PO TABS: 40.0000 mg | ORAL_TABLET | Freq: Two times a day (BID) | ORAL | Status: DC

## 2012-12-10 MED ORDER — CITALOPRAM HYDROBROMIDE 20 MG PO TABS: 10.0000 mg | ORAL_TABLET | Freq: Every day | ORAL | Status: DC

## 2012-12-10 MED ORDER — INSULIN GLARGINE 100 UNIT/ML ~~LOC~~ SOLN: 55.0000 [IU] | Freq: Every day | SUBCUTANEOUS | Status: DC

## 2012-12-10 MED ORDER — METHOCARBAMOL 500 MG PO TABS: 500.0000 mg | ORAL_TABLET | Freq: Two times a day (BID) | ORAL | Status: DC
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
## 2012-12-10 NOTE — Progress Notes (Signed)
All d/c instructions given to pt.  D/C to home.

## 2012-12-20 ENCOUNTER — Emergency Department (HOSPITAL_COMMUNITY): Payer: Medicaid Other

## 2012-12-20 ENCOUNTER — Emergency Department (HOSPITAL_COMMUNITY)
Admission: EM | Admit: 2012-12-20 | Discharge: 2012-12-21 | Disposition: A | Discharge status: Home / Self Care | Payer: Medicaid Other | Attending: Emergency Medicine | Admitting: Emergency Medicine

## 2012-12-20 ENCOUNTER — Encounter (HOSPITAL_COMMUNITY): Payer: Self-pay | Admitting: *Deleted

## 2012-12-20 ENCOUNTER — Other Ambulatory Visit: Payer: Self-pay

## 2012-12-20 ENCOUNTER — Telehealth: Payer: Self-pay | Admitting: Pulmonary Disease

## 2012-12-20 DIAGNOSIS — M545 Low back pain, unspecified: Secondary | ICD-10-CM | POA: Insufficient documentation

## 2012-12-20 DIAGNOSIS — R079 Chest pain, unspecified: Secondary | ICD-10-CM | POA: Insufficient documentation

## 2012-12-20 DIAGNOSIS — G4733 Obstructive sleep apnea (adult) (pediatric): Secondary | ICD-10-CM | POA: Insufficient documentation

## 2012-12-20 DIAGNOSIS — IMO0001 Reserved for inherently not codable concepts without codable children: Secondary | ICD-10-CM | POA: Insufficient documentation

## 2012-12-20 DIAGNOSIS — Z79899 Other long term (current) drug therapy: Secondary | ICD-10-CM | POA: Insufficient documentation

## 2012-12-20 DIAGNOSIS — I10 Essential (primary) hypertension: Secondary | ICD-10-CM | POA: Insufficient documentation

## 2012-12-20 DIAGNOSIS — E1149 Type 2 diabetes mellitus with other diabetic neurological complication: Secondary | ICD-10-CM | POA: Insufficient documentation

## 2012-12-20 DIAGNOSIS — R0602 Shortness of breath: Secondary | ICD-10-CM | POA: Insufficient documentation

## 2012-12-20 DIAGNOSIS — R739 Hyperglycemia, unspecified: Secondary | ICD-10-CM

## 2012-12-20 DIAGNOSIS — E1169 Type 2 diabetes mellitus with other specified complication: Secondary | ICD-10-CM | POA: Insufficient documentation

## 2012-12-20 DIAGNOSIS — Z7982 Long term (current) use of aspirin: Secondary | ICD-10-CM | POA: Insufficient documentation

## 2012-12-20 DIAGNOSIS — Z8739 Personal history of other diseases of the musculoskeletal system and connective tissue: Secondary | ICD-10-CM | POA: Insufficient documentation

## 2012-12-20 DIAGNOSIS — Z8679 Personal history of other diseases of the circulatory system: Secondary | ICD-10-CM | POA: Insufficient documentation

## 2012-12-20 DIAGNOSIS — E785 Hyperlipidemia, unspecified: Secondary | ICD-10-CM | POA: Insufficient documentation

## 2012-12-20 DIAGNOSIS — E1142 Type 2 diabetes mellitus with diabetic polyneuropathy: Secondary | ICD-10-CM | POA: Insufficient documentation

## 2012-12-20 DIAGNOSIS — Z794 Long term (current) use of insulin: Secondary | ICD-10-CM | POA: Insufficient documentation

## 2012-12-20 DIAGNOSIS — Z9861 Coronary angioplasty status: Secondary | ICD-10-CM | POA: Insufficient documentation

## 2012-12-20 DIAGNOSIS — I5032 Chronic diastolic (congestive) heart failure: Secondary | ICD-10-CM | POA: Insufficient documentation

## 2012-12-20 DIAGNOSIS — G8929 Other chronic pain: Secondary | ICD-10-CM

## 2012-12-20 DIAGNOSIS — Z9851 Tubal ligation status: Secondary | ICD-10-CM | POA: Insufficient documentation

## 2012-12-20 LAB — OCCULT BLOOD, POC DEVICE: Fecal Occult Bld: NEGATIVE

## 2012-12-20 LAB — BASIC METABOLIC PANEL
GFR calc non Af Amer: >90 mL/min (ref >90)
Glucose, Bld: 308 mg/dL — ABNORMAL HIGH (ref 70–99)
Potassium: 3.8 mEq/L (ref 3.5–5.1)
Sodium: 130 mEq/L — ABNORMAL LOW (ref 135–145)

## 2012-12-20 LAB — CBC
Hemoglobin: 11.1 g/dL — ABNORMAL LOW (ref 12.0–15.0)
MCHC: 32.3 g/dL (ref 30.0–36.0)
Platelets: 222 10*3/uL (ref 150–400)
RBC: 4.18 MIL/uL (ref 3.87–5.11)

## 2012-12-20 LAB — PRO B NATRIURETIC PEPTIDE: Pro B Natriuretic peptide (BNP): 935.9 pg/mL — ABNORMAL HIGH (ref 0–125)

## 2012-12-20 LAB — POCT I-STAT TROPONIN I: Troponin i, poc: 0 ng/mL (ref 0.00–0.08)

## 2012-12-20 NOTE — ED Notes (Signed)
Pt used the bathroom willingly, pt not cathed

## 2012-12-20 NOTE — ED Notes (Signed)
Patient currently resting quietly in bed; no respiratory or acute distress noted.  Patient informed that we need a urine sample.  Patient given two pillows, per request.  Denies any other needs at this time; will continue to monitor.

## 2012-12-20 NOTE — ED Notes (Signed)
Resident at bedside assessing patient

## 2012-12-20 NOTE — ED Provider Notes (Signed)
History     CSN: [id number]  Arrival date & time 12/20/12  2124   None     Chief Complaint  Patient presents with  . Chest Pain    (Consider location/radiation/quality/duration/timing/severity/associated sxs/prior treatment) HPI chief complaint: Chest pain and shortness of breath. Onset: Several weeks ago. Location: Chest. Worse with exertion. Quality: Dull. Surgery: Mild. Timing: Intermittent. Context: Symptoms have been unchanged since her last discharge on December 29. For signs and symptoms the review of systems. Regarding social history see nursing notes. I have reviewed patient's past medical, past surgical, social history as well as meds and allergies.  Past Medical History  Diagnosis Date  . Hypertension     Poorly controlled. Has had HTN since age 33. Angioedema with ACEI.  24 Hr urine and renal arterial dopplers ordered . . . Never done  . Diabetes mellitus, type 2     HbA1C  11.1 8/12  . NICM (nonischemic cardiomyopathy)     EF 45-50% in 8/12, cath 6/12 showed normal coronaries, EF 50-55% by LV gram  . Morbid obesity   . Allergic rhinitis   . HLD (hyperlipidemia)     LDL 110.9 in 9/12  . OSA (obstructive sleep apnea)     sleep study in 8/12 showed moderate to severe OSA requiring CPAP  . Chronic lower back pain     secondary to DJD, obseity, hip problems. Sees Dr. Ivory Broad  . 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 Chronic diastolic heart failure      Primarily diastolic CHF: Likely due to uncontrolled HTN. Last echo (8/12) with EF 45-50%, mild to moderate LVH with some asymmetric septal hypertrophy, RV normal size and systolic function. EF 50-55% by LV-gram in 6/12.   . Degeneration of lumbar or lumbosacral intervertebral disc   . Polyneuropathy in diabetes(357.2)   . Thoracic or lumbosacral neuritis or radiculitis, unspecified   . Calcifying tendinitis of shoulder   . LBBB (left bundle branch block)   . CHF (congestive heart failure)      Past Surgical History  Procedure Date  . Breast biopsies   . Tubal ligation 05/31/1985  . Hand surgery     Left  . Shoulder surgery     right  . Dental surgery     tumor removed   . Cardiac catheterization     Family History  Problem Relation Age of Onset  . Heart disease Mother   . Heart disease Paternal Grandmother   . Heart disease Paternal Grandfather   . Stroke Paternal Grandfather   . Heart disease Father     History  Substance Use Topics  . Smoking status: Never Smoker   . Smokeless tobacco: Never Used  . Alcohol Use: No    OB History    Grav Para Term Preterm Abortions TAB SAB Ect Mult Living                  Review of Systems  Constitutional: Negative for fever and chills.  HENT: Negative for trouble swallowing, neck pain and neck stiffness.   Eyes: Negative for pain, discharge and itching.  Respiratory: Positive for shortness of breath. Negative for cough, chest tightness, wheezing and stridor.   Cardiovascular: Positive for chest pain. Negative for palpitations and leg swelling.  Gastrointestinal: Negative for nausea, vomiting, abdominal pain, diarrhea, constipation and blood in stool.  Genitourinary: Negative for dysuria, urgency, frequency, hematuria, flank pain, decreased urine volume, vaginal bleeding, vaginal discharge,  difficulty urinating, vaginal pain and pelvic pain.  Musculoskeletal: Positive for myalgias. Negative for back pain and joint swelling.       Generalized muscle pain for more than 5 weeks.  Skin: Negative for rash and wound.  Neurological: Negative for dizziness, tremors, seizures, syncope, facial asymmetry, speech difficulty, weakness, light-headedness, numbness and headaches.  Hematological: Negative for adenopathy. Does not bruise/bleed easily.  Psychiatric/Behavioral: Negative for confusion and decreased concentration.    Allergies  Clindamycin; Enalapril maleate; and Lisinopril  Home Medications   Current Outpatient Rx   Name  Route  Sig  Dispense  Refill  . ALBUTEROL SULFATE HFA 108 (90 BASE) MCG/ACT IN AERS   Inhalation   Inhale 2 puffs into the lungs every 6 (six) hours as needed. For wheezing         . ASPIRIN EC 81 MG PO TBEC   Oral   Take 1 tablet (81 mg total) by mouth daily.         [name] CARBAMAZEPINE ER 200 MG PO TB12   Oral   Take 1 tablet (200 mg total) by mouth 2 (two) times daily. Take at night only for 7 days then increase to twice daily   60 tablet   3   . CITALOPRAM HYDROBROMIDE 20 MG PO TABS   Oral   Take 0.5 tablets (10 mg total) by mouth daily.   30 tablet   0   . CLONIDINE HCL 0.2 MG PO TABS   Oral   Take 1 tablet (0.2 mg total) by mouth 2 (two) times daily.   60 tablet   6   . DICLOFENAC SODIUM 1 % TD GEL   Topical   Apply 1 application topically 4 (four) times daily.   3 Tube   4   . FUROSEMIDE 40 MG PO TABS   Oral   Take 1 tablet (40 mg total) by mouth 2 (two) times daily.   30 tablet   0   . HYDROCODONE-ACETAMINOPHEN 5-325 MG PO TABS   Oral   Take 1 tablet by mouth every 6 (six) hours as needed. For pain   75 tablet   1   . INSULIN GLARGINE 100 UNIT/ML Foley SOLN   Subcutaneous   Inject 55 Units into the skin daily.   10 mL   0   . METFORMIN HCL ER (MOD) 1000 MG PO TB24   Oral   Take 500 mg by mouth 2 (two) times daily with a meal. Take 1/2 tablet tablet twice day by mouth         . METHOCARBAMOL 500 MG PO TABS   Oral   Take 1 tablet (500 mg total) by mouth every 12 (twelve) hours. For muscle spasm   30 tablet   0   . METOPROLOL SUCCINATE ER 100 MG PO TB24   Oral   Take 100 mg by mouth daily. Take with or immediately following a meal.         . FISH OIL 1000 MG PO CAPS   Oral   Take 2 capsules (2,000 mg total) by mouth daily.   60 capsule   3   . OXYCODONE-ACETAMINOPHEN 10-325 MG PO TABS   Oral   Take 1 tablet by mouth every 4 (four) hours as needed. For pain         . PANTOPRAZOLE SODIUM 40 MG PO TBEC   Oral   Take 40 mg by  mouth daily.          [name]  POTASSIUM CHLORIDE ER 10 MEQ PO TBCR   Oral   Take 1 tablet (10 mEq total) by mouth daily.   90 tablet   3   . SPIRONOLACTONE 25 MG PO TABS   Oral   Take 1 tablet (25 mg total) by mouth daily.   30 tablet   6     LMP 12/14/2012  Physical Exam  Constitutional: She is oriented to person, place, and time. She appears well-developed and well-nourished. No distress.  HENT:  Head: Normocephalic and atraumatic.  Eyes: Conjunctivae normal are normal. Right eye exhibits no discharge. Left eye exhibits no discharge. No scleral icterus.  Neck: Normal range of motion. Neck supple. No JVD present.  Cardiovascular: Normal rate, regular rhythm, normal heart sounds and intact distal pulses.   No murmur heard. Pulmonary/Chest: Effort normal and breath sounds normal. No respiratory distress. She has no wheezes. She has no rales. She exhibits no tenderness.  Abdominal: Soft. Bowel sounds are normal. She exhibits no distension and no mass. There is no tenderness. There is no rebound and no guarding.  Musculoskeletal: Normal range of motion. She exhibits no edema and no tenderness.  Neurological: She is alert and oriented to person, place, and time.  Skin: Skin is warm and dry. She is not diaphoretic.  Psychiatric: She has a normal mood and affect.    ED Course  Procedures (including critical care time)  Labs Reviewed  CBC - Abnormal; Notable for the following:    Hemoglobin 11.1 (*)     HCT 34.4 (*)     All other components within normal limits  BASIC METABOLIC PANEL - Abnormal; Notable for the following:    Sodium 130 (*)     Chloride 94 (*)     Glucose, Bld 308 (*)     BUN 5 (*)     All other components within normal limits  PRO B NATRIURETIC PEPTIDE - Abnormal; Notable for the following:    Pro B Natriuretic peptide (BNP) 935.9 (*)     All other components within normal limits  POCT I-STAT TROPONIN I  CK  OCCULT BLOOD, POC DEVICE  INFLUENZA PANEL BY PCR    URINALYSIS, ROUTINE W REFLEX MICROSCOPIC   Dg Chest 2 View  12/20/2012  *RADIOLOGY REPORT*  Clinical Data: Chest pain and shortness of breath.  CHEST - 2 VIEW  Comparison: PA and lateral chest 12/06/2012.  Findings: There is cardiomegaly and central vascular congestion. No consolidative process, pneumothorax or effusion is identified. No focal bony abnormality.  IMPRESSION: Cardiomegaly and central vascular congestion.   Original Report Authenticated By: Holley Dexter, M.D.      1. Chronic chest pain   2. SOB (shortness of breath) on exertion      EKG reviewed and interpreted. Sinus tachycardia rate 101. Right axis deviation. Nonspecific conduction block. T-wave inversions in V6 and lead 3. Unchanged from prior. No ST segment changes. MDM  Patient is a well-appearing 47 year old female with a history of nonischemic cardiomyopathy, chronic diastolic heart failure, left bundle-branch block presenting with numerous weeks of ongoing unchanged chest pain and shortness of breath though her present during her admission but she was discharged on December 29. Symptoms have been unchanged since then. Mostly exertional in nature. Patient is noncompliant with her medications. Weight has been stable. Lower leg edema has been improving. Patient also reports more than 5 weeks of generalized muscle pain. No flulike symptoms. CK is normal. No formal diagnosis of fibromyalgia although suspect. Hyperglycemia the patient has  had values similar to this in the past. DKA or hyperosmolar syndrome felt to be doubtful. Troponin within normal limits. BNP 935. Patient does not appear volume overloaded. Patient ambulated and did become mildly dyspneic but did not become hypoxic. Case discussed with on-call cardiologist Dr. Antoine Poche who agrees with our plan to provide IV Lasix and have patient follow up in clinic this week. She should continue her regular diabetes treatment at home.  Of note urinalysis sent given patient has  generalized complaints and no urologic complaints. Urinalysis machine in lab is currently nonfunctional so lab specimens are being sent to [facility] long. Urine culture pending at this time. If positive patient will receive notification by telephone. Discharge home in stable and unchanged condition.         Consuello Masse, MD 12/21/12 [phone]

## 2012-12-20 NOTE — ED Notes (Signed)
Pt c/o CP, shoulder pain, SOB since Sunday.  States she was admitted12/29 for CHF.

## 2012-12-20 NOTE — Telephone Encounter (Signed)
I spoke with pt and she stated she will call [facility] for them to get download. She states she has been in the hospital reason they have not been able to get in touch with her.   lmomtcb x1 for lecretia

## 2012-12-20 NOTE — ED Notes (Signed)
Patient reports that she was recently discharged from the hospital on the 29th of January (diagnosis: congestive heart failure); reports that she has been having intermittent chest pain since discharge.  Patient also complaining of fatigue, nausea, dark stools, and body aches.  Patient alert and oriented x4; PERRL present.  Upon arrival to room, patient connected to continuous cardiac, pulse ox, and blood pressure monitor.  Will continue to monitor.

## 2012-12-21 LAB — URINALYSIS, ROUTINE W REFLEX MICROSCOPIC
Bilirubin Urine: NEGATIVE
Glucose, UA: >1000 mg/dL — AB
Hgb urine dipstick: NEGATIVE
Ketones, ur: NEGATIVE mg/dL
Nitrite: NEGATIVE
Specific Gravity, Urine: 1.029 (ref 1.005–1.030)
pH: 6 (ref 5.0–8.0)

## 2012-12-21 LAB — URINE MICROSCOPIC-ADD ON

## 2012-12-21 LAB — INFLUENZA PANEL BY PCR (TYPE A & B): H1N1 flu by pcr: NOT DETECTED

## 2012-12-21 MED ORDER — FUROSEMIDE 10 MG/ML IJ SOLN
40.0000 mg | Freq: Once | INTRAMUSCULAR | Status: AC
  Administered 2012-12-21: 40 mg via INTRAVENOUS
  Filled 2012-12-21: qty 4

## 2012-12-21 NOTE — ED Notes (Signed)
Patient given discharge paperwork; went over discharge instructions with patient.  Patient instructed to follow up with PCP and cardiologist this week and to return to the ED for new, worsening, or concerning symptoms.

## 2012-12-21 NOTE — ED Provider Notes (Signed)
I have personally seen and examined the patient.  I have discussed the plan of care with the resident.  I have reviewed the documentation on PMH/FH/Soc. History.  I have reviewed the documentation of the resident and agree.  I have reviewed and agree with the ECG interpretation(s) documented by the resident.  Pt well appearing.  Admits these symptoms have been ongoing for weeks.  She has nonischemic CM, last cath showed mostly normal coronaries.  Do not feel admission is warranted  Joya Gaskins, MD 12/21/12 2338

## 2012-12-21 NOTE — ED Notes (Signed)
Patient currently resting quietly in bed; no respiratory or acute distress noted.  Patient updated on plan of care; informed patient that EDP has made consult to cardiology; patient denies needs at this time. Will continue to monitor.

## 2012-12-21 NOTE — ED Notes (Signed)
Pt ambulated without assistance. O2 sats 100% to 97%, MD is aware

## 2012-12-22 NOTE — Telephone Encounter (Signed)
i called made lecretia aware of this. Nothing further was needed

## 2012-12-23 LAB — URINE CULTURE: Colony Count: >=100000

## 2012-12-24 NOTE — ED Notes (Signed)
+   Urine Chart sent to EDP office for review. 

## 2012-12-27 ENCOUNTER — Telehealth (HOSPITAL_COMMUNITY): Payer: Self-pay | Admitting: Emergency Medicine

## 2012-12-27 NOTE — ED Notes (Signed)
Chart reviewed by B. Laveda Norman PA "Recommend Cipro 500 mg po BID x 7 days"  1/15 Spoke w/pt.  Informed of dx & need for addl tx.  Rx called to [facility] [phone].

## 2013-01-01 ENCOUNTER — Encounter: Payer: Medicaid Other | Attending: Physical Medicine & Rehabilitation | Admitting: Physical Medicine & Rehabilitation

## 2013-01-01 ENCOUNTER — Encounter: Payer: Self-pay | Admitting: Physical Medicine & Rehabilitation

## 2013-01-01 VITALS — BP 158/103 | HR 100 | Resp 14 | Ht 69.0 in | Wt 283.2 lb

## 2013-01-01 DIAGNOSIS — E1142 Type 2 diabetes mellitus with diabetic polyneuropathy: Secondary | ICD-10-CM

## 2013-01-01 DIAGNOSIS — M1710 Unilateral primary osteoarthritis, unspecified knee: Secondary | ICD-10-CM

## 2013-01-01 DIAGNOSIS — I10 Essential (primary) hypertension: Secondary | ICD-10-CM

## 2013-01-01 DIAGNOSIS — E1149 Type 2 diabetes mellitus with other diabetic neurological complication: Secondary | ICD-10-CM

## 2013-01-01 DIAGNOSIS — M47817 Spondylosis without myelopathy or radiculopathy, lumbosacral region: Secondary | ICD-10-CM

## 2013-01-01 DIAGNOSIS — M47816 Spondylosis without myelopathy or radiculopathy, lumbar region: Secondary | ICD-10-CM

## 2013-01-01 DIAGNOSIS — E669 Obesity, unspecified: Secondary | ICD-10-CM

## 2013-01-01 MED ORDER — OXYCODONE-ACETAMINOPHEN 10-325 MG PO TABS: 1.0000 | ORAL_TABLET | Freq: Three times a day (TID) | ORAL | Status: DC | PRN
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
## 2013-01-01 NOTE — Patient Instructions (Addendum)
WORK ON 3 MEALS PER DAY.  INCREASE ACTIVITY AS TOLERATED.    Knee Exercises EXERCISES RANGE OF MOTION(ROM) AND STRETCHING EXERCISES These exercises may help you when beginning to rehabilitate your injury. Your symptoms may resolve with or without further involvement from your physician, physical therapist or athletic trainer. While completing these exercises, remember:   Restoring tissue flexibility helps normal motion to return to the joints. This allows healthier, less painful movement and activity.  An effective stretch should be held for at least 30 seconds.  A stretch should never be painful. You should only feel a gentle lengthening or release in the stretched tissue. STRETCH - Knee Extension, Prone  Lie on your stomach on a firm surface, such as a bed or countertop. Place your right / left knee and leg just beyond the edge of the surface. You may wish to place a towel under the far end of your right / left thigh for comfort.  Relax your leg muscles and allow gravity to straighten your knee. Your clinician may advise you to add an ankle weight if more resistance is helpful for you.  You should feel a stretch in the back of your right / left knee. Hold this position for __________ seconds. Repeat __________ times. Complete this stretch __________ times per day. * Your physician, physical therapist or athletic trainer may ask you to add ankle weight to enhance your stretch.  RANGE OF MOTION - Knee Flexion, Active  Lie on your back with both knees straight. (If this causes back discomfort, bend your opposite knee, placing your foot flat on the floor.)  Slowly slide your heel back toward your buttocks until you feel a gentle stretch in the front of your knee or thigh.  Hold for __________ seconds. Slowly slide your heel back to the starting position. Repeat __________ times. Complete this exercise __________ times per day.  STRETCH - Quadriceps, Prone   Lie on your stomach on a  firm surface, such as a bed or padded floor.  Bend your right / left knee and grasp your ankle. If you are unable to reach, your ankle or pant leg, use a belt around your foot to lengthen your reach.  Gently pull your heel toward your buttocks. Your knee should not slide out to the side. You should feel a stretch in the front of your thigh and/or knee.  Hold this position for __________ seconds. Repeat __________ times. Complete this stretch __________ times per day.  STRETCH  Hamstrings, Supine   Lie on your back. Loop a belt or towel over the ball of your right / left foot.  Straighten your right / left knee and slowly pull on the belt to raise your leg. Do not allow the right / left knee to bend. Keep your opposite leg flat on the floor.  Raise the leg until you feel a gentle stretch behind your right / left knee or thigh. Hold this position for __________ seconds. Repeat __________ times. Complete this stretch __________ times per day.  STRENGTHENING EXERCISES These exercises may help you when beginning to rehabilitate your injury. They may resolve your symptoms with or without further involvement from your physician, physical therapist or athletic trainer. While completing these exercises, remember:   Muscles can gain both the endurance and the strength needed for everyday activities through controlled exercises.  Complete these exercises as instructed by your physician, physical therapist or athletic trainer. Progress the resistance and repetitions only as guided.  You may experience muscle  soreness or fatigue, but the pain or discomfort you are trying to eliminate should never worsen during these exercises. If this pain does worsen, stop and make certain you are following the directions exactly. If the pain is still present after adjustments, discontinue the exercise until you can discuss the trouble with your clinician. STRENGTH - Quadriceps, Isometrics  Lie on your back with your  right / left leg extended and your opposite knee bent.  Gradually tense the muscles in the front of your right / left thigh. You should see either your knee cap slide up toward your hip or increased dimpling just above the knee. This motion will push the back of the knee down toward the floor/mat/bed on which you are lying.  Hold the muscle as tight as you can without increasing your pain for __________ seconds.  Relax the muscles slowly and completely in between each repetition. Repeat __________ times. Complete this exercise __________ times per day.  STRENGTH - Quadriceps, Short Arcs   Lie on your back. Place a __________ inch towel roll under your knee so that the knee slightly bends.  Raise only your lower leg by tightening the muscles in the front of your thigh. Do not allow your thigh to rise.  Hold this position for __________ seconds. Repeat __________ times. Complete this exercise __________ times per day.  OPTIONAL ANKLE WEIGHTS: Begin with ____________________, but DO NOT exceed ____________________. Increase in 1 pound/0.5 kilogram increments.  STRENGTH - Quadriceps, Straight Leg Raises  Quality counts! Watch for signs that the quadriceps muscle is working to insure you are strengthening the correct muscles and not "cheating" by substituting with healthier muscles.  Lay on your back with your right / left leg extended and your opposite knee bent.  Tense the muscles in the front of your right / left thigh. You should see either your knee cap slide up or increased dimpling just above the knee. Your thigh may even quiver.  Tighten these muscles even more and raise your leg 4 to 6 inches off the floor. Hold for __________ seconds.  Keeping these muscles tense, lower your leg.  Relax the muscles slowly and completely in between each repetition. Repeat __________ times. Complete this exercise __________ times per day.  STRENGTH - Hamstring, Curls  Lay on your stomach with your  legs extended. (If you lay on a bed, your feet may hang over the edge.)  Tighten the muscles in the back of your thigh to bend your right / left knee up to 90 degrees. Keep your hips flat on the bed/floor.  Hold this position for __________ seconds.  Slowly lower your leg back to the starting position. Repeat __________ times. Complete this exercise __________ times per day.  OPTIONAL ANKLE WEIGHTS: Begin with ____________________, but DO NOT exceed ____________________. Increase in 1 pound/0.5 kilogram increments.  STRENGTH  Quadriceps, Squats  Stand in a door frame so that your feet and knees are in line with the frame.  Use your hands for balance, not support, on the frame.  Slowly lower your weight, bending at the hips and knees. Keep your lower legs upright so that they are parallel with the door frame. Squat only within the range that does not increase your knee pain. Never let your hips drop below your knees.  Slowly return upright, pushing with your legs, not pulling with your hands. Repeat __________ times. Complete this exercise __________ times per day.  STRENGTH - Quadriceps, Wall Slides  Follow guidelines for form closely.  Increased knee pain often results from poorly placed feet or knees.  Lean against a smooth wall or door and walk your feet out 18-24 inches. Place your feet hip-width apart.  Slowly slide down the wall or door until your knees bend __________ degrees.* Keep your knees over your heels, not your toes, and in line with your hips, not falling to either side.  Hold for __________ seconds. Stand up to rest for __________ seconds in between each repetition. Repeat __________ times. Complete this exercise __________ times per day. * Your physician, physical therapist or athletic trainer will alter this angle based on your symptoms and progress. Document Released: 10/13/2005 Document Revised: 02/21/2012 Document Reviewed: 03/13/2009 [facility] Patient Information  2013 [place], [place].

## 2013-01-23 ENCOUNTER — Telehealth: Payer: Self-pay

## 2013-01-23 DIAGNOSIS — M1710 Unilateral primary osteoarthritis, unspecified knee: Secondary | ICD-10-CM

## 2013-01-23 DIAGNOSIS — E1142 Type 2 diabetes mellitus with diabetic polyneuropathy: Secondary | ICD-10-CM

## 2013-01-23 DIAGNOSIS — I10 Essential (primary) hypertension: Secondary | ICD-10-CM

## 2013-01-23 DIAGNOSIS — E669 Obesity, unspecified: Secondary | ICD-10-CM

## 2013-01-23 DIAGNOSIS — M47816 Spondylosis without myelopathy or radiculopathy, lumbar region: Secondary | ICD-10-CM

## 2013-01-23 NOTE — Telephone Encounter (Signed)
Percocet refill

## 2013-01-24 MED ORDER — OXYCODONE-ACETAMINOPHEN 10-325 MG PO TABS: 1.0000 | ORAL_TABLET | Freq: Three times a day (TID) | ORAL | Status: DC | PRN

## 2013-01-24 NOTE — Telephone Encounter (Signed)
done

## 2013-01-24 NOTE — Telephone Encounter (Signed)
Notified rx available.

## 2013-02-14 ENCOUNTER — Telehealth: Payer: Self-pay | Admitting: *Deleted

## 2013-02-14 DIAGNOSIS — I5032 Chronic diastolic (congestive) heart failure: Secondary | ICD-10-CM

## 2013-02-14 DIAGNOSIS — E669 Obesity, unspecified: Secondary | ICD-10-CM

## 2013-02-14 DIAGNOSIS — E1142 Type 2 diabetes mellitus with diabetic polyneuropathy: Secondary | ICD-10-CM

## 2013-02-14 DIAGNOSIS — M47816 Spondylosis without myelopathy or radiculopathy, lumbar region: Secondary | ICD-10-CM

## 2013-02-14 DIAGNOSIS — I10 Essential (primary) hypertension: Secondary | ICD-10-CM

## 2013-02-14 DIAGNOSIS — M1712 Unilateral primary osteoarthritis, left knee: Secondary | ICD-10-CM

## 2013-02-14 MED ORDER — DICLOFENAC SODIUM 1 % TD GEL: 2.0000 g | Freq: Four times a day (QID) | TRANSDERMAL | Status: DC

## 2013-02-14 MED ORDER — OXYCODONE-ACETAMINOPHEN 10-325 MG PO TABS: 1.0000 | ORAL_TABLET | Freq: Three times a day (TID) | ORAL | Status: DC | PRN

## 2013-02-14 NOTE — Telephone Encounter (Signed)
Refill percocet (01/24/13), voltaren gel, handicap sticker.  Voltaren gell refilled. She may pick up her percocet on Monday 02/19/13 and her handicap sticker appl.

## 2013-02-24 ENCOUNTER — Encounter (HOSPITAL_COMMUNITY): Payer: Self-pay | Admitting: Emergency Medicine

## 2013-02-24 ENCOUNTER — Emergency Department (HOSPITAL_COMMUNITY): Payer: Medicaid Other

## 2013-02-24 ENCOUNTER — Inpatient Hospital Stay (HOSPITAL_COMMUNITY)
Admission: EM | Admit: 2013-02-24 | Discharge: 2013-02-27 | DRG: 292 | Disposition: A | Discharge status: Home / Self Care | Payer: Medicaid Other | Attending: Internal Medicine | Admitting: Internal Medicine

## 2013-02-24 DIAGNOSIS — Z6841 Body Mass Index (BMI) 40.0 and over, adult: Secondary | ICD-10-CM

## 2013-02-24 DIAGNOSIS — E1142 Type 2 diabetes mellitus with diabetic polyneuropathy: Secondary | ICD-10-CM | POA: Diagnosis present

## 2013-02-24 DIAGNOSIS — I447 Left bundle-branch block, unspecified: Secondary | ICD-10-CM | POA: Diagnosis present

## 2013-02-24 DIAGNOSIS — I5033 Acute on chronic diastolic (congestive) heart failure: Secondary | ICD-10-CM

## 2013-02-24 DIAGNOSIS — I5043 Acute on chronic combined systolic (congestive) and diastolic (congestive) heart failure: Principal | ICD-10-CM | POA: Diagnosis present

## 2013-02-24 DIAGNOSIS — R0602 Shortness of breath: Secondary | ICD-10-CM | POA: Diagnosis present

## 2013-02-24 DIAGNOSIS — E785 Hyperlipidemia, unspecified: Secondary | ICD-10-CM | POA: Diagnosis present

## 2013-02-24 DIAGNOSIS — I10 Essential (primary) hypertension: Secondary | ICD-10-CM | POA: Diagnosis present

## 2013-02-24 DIAGNOSIS — I509 Heart failure, unspecified: Secondary | ICD-10-CM | POA: Diagnosis present

## 2013-02-24 DIAGNOSIS — J309 Allergic rhinitis, unspecified: Secondary | ICD-10-CM | POA: Diagnosis present

## 2013-02-24 DIAGNOSIS — M47817 Spondylosis without myelopathy or radiculopathy, lumbosacral region: Secondary | ICD-10-CM | POA: Diagnosis present

## 2013-02-24 DIAGNOSIS — M1610 Unilateral primary osteoarthritis, unspecified hip: Secondary | ICD-10-CM | POA: Diagnosis present

## 2013-02-24 DIAGNOSIS — Z881 Allergy status to other antibiotic agents status: Secondary | ICD-10-CM

## 2013-02-24 DIAGNOSIS — G8929 Other chronic pain: Secondary | ICD-10-CM | POA: Diagnosis present

## 2013-02-24 DIAGNOSIS — Z794 Long term (current) use of insulin: Secondary | ICD-10-CM

## 2013-02-24 DIAGNOSIS — K5900 Constipation, unspecified: Secondary | ICD-10-CM | POA: Diagnosis present

## 2013-02-24 DIAGNOSIS — E1140 Type 2 diabetes mellitus with diabetic neuropathy, unspecified: Secondary | ICD-10-CM | POA: Diagnosis present

## 2013-02-24 DIAGNOSIS — Z7982 Long term (current) use of aspirin: Secondary | ICD-10-CM

## 2013-02-24 DIAGNOSIS — E669 Obesity, unspecified: Secondary | ICD-10-CM | POA: Diagnosis present

## 2013-02-24 DIAGNOSIS — E1149 Type 2 diabetes mellitus with other diabetic neurological complication: Secondary | ICD-10-CM | POA: Diagnosis present

## 2013-02-24 DIAGNOSIS — M169 Osteoarthritis of hip, unspecified: Secondary | ICD-10-CM | POA: Diagnosis present

## 2013-02-24 DIAGNOSIS — Z888 Allergy status to other drugs, medicaments and biological substances status: Secondary | ICD-10-CM

## 2013-02-24 DIAGNOSIS — Z79899 Other long term (current) drug therapy: Secondary | ICD-10-CM

## 2013-02-24 DIAGNOSIS — E876 Hypokalemia: Secondary | ICD-10-CM | POA: Diagnosis present

## 2013-02-24 DIAGNOSIS — I428 Other cardiomyopathies: Secondary | ICD-10-CM | POA: Diagnosis present

## 2013-02-24 DIAGNOSIS — I5042 Chronic combined systolic (congestive) and diastolic (congestive) heart failure: Secondary | ICD-10-CM | POA: Diagnosis present

## 2013-02-24 DIAGNOSIS — G4733 Obstructive sleep apnea (adult) (pediatric): Secondary | ICD-10-CM | POA: Diagnosis present

## 2013-02-24 HISTORY — DX: Chronic combined systolic (congestive) and diastolic (congestive) heart failure: I50.42

## 2013-02-24 LAB — CBC WITH DIFFERENTIAL/PLATELET
Basophils Absolute: 0 10*3/uL (ref 0.0–0.1)
Basophils Relative: 0 % (ref 0–1)
Eosinophils Absolute: 0.1 10*3/uL (ref 0.0–0.7)
Eosinophils Relative: 1 % (ref 0–5)
HCT: 39 % (ref 36.0–46.0)
MCH: 28 pg (ref 26.0–34.0)
MCHC: 33.3 g/dL (ref 30.0–36.0)
MCV: 84.1 fL (ref 78.0–100.0)
Monocytes Absolute: 0.9 10*3/uL (ref 0.1–1.0)
Platelets: 301 10*3/uL (ref 150–400)
RDW: 13.1 % (ref 11.5–15.5)
WBC: 10.2 10*3/uL (ref 4.0–10.5)

## 2013-02-24 LAB — POCT I-STAT TROPONIN I: Troponin i, poc: 0.01 ng/mL (ref 0.00–0.08)

## 2013-02-24 LAB — COMPREHENSIVE METABOLIC PANEL
ALT: 19 U/L (ref 0–35)
AST: 19 U/L (ref 0–37)
Calcium: 9.5 mg/dL (ref 8.4–10.5)
Creatinine, Ser: 0.92 mg/dL (ref 0.50–1.10)
GFR calc non Af Amer: 74 mL/min — ABNORMAL LOW (ref >90)
Sodium: 133 mEq/L — ABNORMAL LOW (ref 135–145)
Total Protein: 8.2 g/dL (ref 6.0–8.3)

## 2013-02-24 LAB — GLUCOSE, CAPILLARY: Glucose-Capillary: 218 mg/dL — ABNORMAL HIGH (ref 70–99)

## 2013-02-24 MED ORDER — ACETAMINOPHEN 325 MG PO TABS
650.0000 mg | ORAL_TABLET | Freq: Four times a day (QID) | ORAL | Status: DC | PRN
  Administered 2013-02-26: 650 mg via ORAL
  Filled 2013-02-24: qty 2

## 2013-02-24 MED ORDER — INSULIN ASPART 100 UNIT/ML ~~LOC~~ SOLN: 0.0000 [IU] | Freq: Three times a day (TID) | SUBCUTANEOUS | Status: DC

## 2013-02-24 MED ORDER — SODIUM CHLORIDE 0.9 % IV SOLN
INTRAVENOUS | Status: DC
  Administered 2013-02-24 – 2013-02-26 (×2): via INTRAVENOUS

## 2013-02-24 MED ORDER — ZOLPIDEM TARTRATE 5 MG PO TABS
5.0000 mg | ORAL_TABLET | Freq: Every evening | ORAL | Status: DC | PRN
  Administered 2013-02-25 (×2): 5 mg via ORAL
  Filled 2013-02-24 (×2): qty 1

## 2013-02-24 MED ORDER — ASPIRIN EC 325 MG PO TBEC
325.0000 mg | DELAYED_RELEASE_TABLET | Freq: Every day | ORAL | Status: DC
  Administered 2013-02-25 – 2013-02-27 (×3): 325 mg via ORAL
  Filled 2013-02-24 (×3): qty 1

## 2013-02-24 MED ORDER — SPIRONOLACTONE 25 MG PO TABS
25.0000 mg | ORAL_TABLET | Freq: Every day | ORAL | Status: DC
  Administered 2013-02-25 – 2013-02-27 (×3): 25 mg via ORAL
  Filled 2013-02-24 (×3): qty 1

## 2013-02-24 MED ORDER — INSULIN GLARGINE 100 UNIT/ML ~~LOC~~ SOLN
55.0000 [IU] | Freq: Every day | SUBCUTANEOUS | Status: DC
  Administered 2013-02-25 – 2013-02-26 (×2): 55 [IU] via SUBCUTANEOUS

## 2013-02-24 MED ORDER — CLONIDINE HCL 0.2 MG PO TABS
0.2000 mg | ORAL_TABLET | Freq: Two times a day (BID) | ORAL | Status: DC
  Administered 2013-02-25 – 2013-02-27 (×5): 0.2 mg via ORAL
  Filled 2013-02-24 (×7): qty 1

## 2013-02-24 MED ORDER — PANTOPRAZOLE SODIUM 40 MG PO TBEC
40.0000 mg | DELAYED_RELEASE_TABLET | Freq: Every day | ORAL | Status: DC
  Administered 2013-02-25 – 2013-02-27 (×3): 40 mg via ORAL
  Filled 2013-02-24 (×3): qty 1

## 2013-02-24 MED ORDER — ALBUTEROL SULFATE HFA 108 (90 BASE) MCG/ACT IN AERS: 2.0000 | INHALATION_SPRAY | Freq: Four times a day (QID) | RESPIRATORY_TRACT | Status: DC | PRN

## 2013-02-24 MED ORDER — SODIUM CHLORIDE 0.9 % IV SOLN: 250.0000 mL | INTRAVENOUS | Status: DC | PRN

## 2013-02-24 MED ORDER — SODIUM CHLORIDE 0.9 % IJ SOLN
3.0000 mL | Freq: Two times a day (BID) | INTRAMUSCULAR | Status: DC
  Administered 2013-02-26: 3 mL via INTRAVENOUS

## 2013-02-24 MED ORDER — ONDANSETRON HCL 4 MG/2ML IJ SOLN: 4.0000 mg | Freq: Four times a day (QID) | INTRAMUSCULAR | Status: DC | PRN

## 2013-02-24 MED ORDER — FUROSEMIDE 10 MG/ML IJ SOLN
60.0000 mg | Freq: Once | INTRAMUSCULAR | Status: AC
  Administered 2013-02-24: 60 mg via INTRAVENOUS
  Filled 2013-02-24: qty 8

## 2013-02-24 MED ORDER — SODIUM CHLORIDE 0.9 % IJ SOLN: 3.0000 mL | INTRAMUSCULAR | Status: DC | PRN

## 2013-02-24 MED ORDER — ALUM & MAG HYDROXIDE-SIMETH 200-200-20 MG/5ML PO SUSP: 30.0000 mL | Freq: Four times a day (QID) | ORAL | Status: DC | PRN

## 2013-02-24 MED ORDER — HYDROMORPHONE HCL PF 1 MG/ML IJ SOLN
0.5000 mg | INTRAMUSCULAR | Status: DC | PRN
  Filled 2013-02-24: qty 1

## 2013-02-24 MED ORDER — POTASSIUM CHLORIDE CRYS ER 20 MEQ PO TBCR
20.0000 meq | EXTENDED_RELEASE_TABLET | Freq: Two times a day (BID) | ORAL | Status: AC
  Administered 2013-02-25 – 2013-02-26 (×4): 20 meq via ORAL
  Filled 2013-02-24 (×4): qty 1

## 2013-02-24 MED ORDER — ASPIRIN EC 81 MG PO TBEC: 81.0000 mg | DELAYED_RELEASE_TABLET | Freq: Every day | ORAL | Status: DC

## 2013-02-24 MED ORDER — FUROSEMIDE 10 MG/ML IJ SOLN
80.0000 mg | Freq: Two times a day (BID) | INTRAMUSCULAR | Status: AC
  Administered 2013-02-25 – 2013-02-26 (×4): 80 mg via INTRAVENOUS
  Filled 2013-02-24 (×4): qty 8

## 2013-02-24 MED ORDER — CARBAMAZEPINE ER 200 MG PO TB12
200.0000 mg | ORAL_TABLET | Freq: Two times a day (BID) | ORAL | Status: DC
  Administered 2013-02-25 – 2013-02-27 (×6): 200 mg via ORAL
  Filled 2013-02-24 (×8): qty 1

## 2013-02-24 MED ORDER — CITALOPRAM HYDROBROMIDE 10 MG PO TABS
10.0000 mg | ORAL_TABLET | Freq: Every day | ORAL | Status: DC
  Administered 2013-02-25 – 2013-02-27 (×3): 10 mg via ORAL
  Filled 2013-02-24 (×3): qty 1

## 2013-02-24 MED ORDER — INSULIN ASPART 100 UNIT/ML ~~LOC~~ SOLN
0.0000 [IU] | Freq: Every day | SUBCUTANEOUS | Status: DC
  Administered 2013-02-25: 2 [IU] via SUBCUTANEOUS

## 2013-02-24 MED ORDER — POTASSIUM CHLORIDE ER 10 MEQ PO TBCR
10.0000 meq | EXTENDED_RELEASE_TABLET | Freq: Every day | ORAL | Status: DC
  Filled 2013-02-24: qty 1

## 2013-02-24 MED ORDER — ONDANSETRON HCL 4 MG PO TABS: 4.0000 mg | ORAL_TABLET | Freq: Four times a day (QID) | ORAL | Status: DC | PRN

## 2013-02-24 MED ORDER — IOHEXOL 350 MG/ML SOLN
100.0000 mL | Freq: Once | INTRAVENOUS | Status: AC | PRN
  Administered 2013-02-24: 100 mL via INTRAVENOUS

## 2013-02-24 MED ORDER — SODIUM CHLORIDE 0.9 % IJ SOLN: 3.0000 mL | Freq: Two times a day (BID) | INTRAMUSCULAR | Status: DC

## 2013-02-24 MED ORDER — ENOXAPARIN SODIUM 40 MG/0.4ML ~~LOC~~ SOLN
40.0000 mg | SUBCUTANEOUS | Status: DC
  Administered 2013-02-25 – 2013-02-26 (×3): 40 mg via SUBCUTANEOUS
  Filled 2013-02-24 (×4): qty 0.4

## 2013-02-24 MED ORDER — OXYCODONE HCL 5 MG PO TABS: 5.0000 mg | ORAL_TABLET | ORAL | Status: DC | PRN

## 2013-02-24 MED ORDER — ACETAMINOPHEN 650 MG RE SUPP: 650.0000 mg | Freq: Four times a day (QID) | RECTAL | Status: DC | PRN

## 2013-02-24 MED ORDER — METOPROLOL SUCCINATE ER 100 MG PO TB24
100.0000 mg | ORAL_TABLET | Freq: Every day | ORAL | Status: DC
  Administered 2013-02-25 – 2013-02-27 (×3): 100 mg via ORAL
  Filled 2013-02-24 (×3): qty 1

## 2013-02-24 NOTE — ED Provider Notes (Signed)
History     CSN: [id number]  Arrival date & time 02/24/13  1839   First MD Initiated Contact with Patient 02/24/13 1857      Chief Complaint  Patient presents with  . Chest Pain  . Shortness of Breath    (Consider location/radiation/quality/duration/timing/severity/associated sxs/prior treatment) Patient is a 47 y.o. female presenting with chest pain and shortness of breath. The history is provided by the patient.  Chest Pain Associated symptoms: shortness of breath   Shortness of Breath Associated symptoms: chest pain    patient here with 2 days of shortness of breath similar to her prior CHF. Notes orthopnea and dyspnea on exertion. Denies any anginal type chest pain. Cough is been nonproductive and not associated with fever. Symptoms worse with exertion better with rest. No treatment used prior to arrival. Patient does not have a current medical doctor. Denies any abdominal pain or near-syncope. Past Medical History  Diagnosis Date  . Hypertension     Poorly controlled. Has had HTN since age 34. Angioedema with ACEI.  24 Hr urine and renal arterial dopplers ordered . . . Never done  . Diabetes mellitus, type 2     HbA1C  11.1 8/12  . NICM (nonischemic cardiomyopathy)     EF 45-50% in 8/12, cath 6/12 showed normal coronaries, EF 50-55% by LV gram  . Morbid obesity   . Allergic rhinitis   . HLD (hyperlipidemia)     LDL 110.9 in 9/12  . OSA (obstructive sleep apnea)     sleep study in 8/12 showed moderate to severe OSA requiring CPAP  . Chronic lower back pain     secondary to DJD, obseity, hip problems. Sees Dr. Ivory Broad  . 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 Chronic diastolic heart failure      Primarily diastolic CHF: Likely due to uncontrolled HTN. Last echo (8/12) with EF 45-50%, mild to moderate LVH with some asymmetric septal hypertrophy, RV normal size and systolic function. EF 50-55% by LV-gram in 6/12.   . Degeneration of lumbar or  lumbosacral intervertebral disc   . Polyneuropathy in diabetes(357.2)   . Thoracic or lumbosacral neuritis or radiculitis, unspecified   . Calcifying tendinitis of shoulder   . LBBB (left bundle branch block)   . CHF (congestive heart failure)     Past Surgical History  Procedure Laterality Date  . Breast biopsies    . Tubal ligation  05/31/1985  . Hand surgery      Left  . Shoulder surgery      right  . Dental surgery      tumor removed   . Cardiac catheterization      Family History  Problem Relation Age of Onset  . Heart disease Mother   . Heart disease Paternal Grandmother   . Heart disease Paternal Grandfather   . Stroke Paternal Grandfather   . Heart disease Father     History  Substance Use Topics  . Smoking status: Never Smoker   . Smokeless tobacco: Never Used  . Alcohol Use: No    OB History   Grav Para Term Preterm Abortions TAB SAB Ect Mult Living                  Review of Systems  Respiratory: Positive for shortness of breath.   Cardiovascular: Positive for chest pain.  All other systems reviewed and are negative.    Allergies  Clindamycin; Enalapril maleate; and  Lisinopril  Home Medications   Current Outpatient Rx  Name  Route  Sig  Dispense  Refill  . albuterol (VENTOLIN HFA) 108 (90 BASE) MCG/ACT inhaler   Inhalation   Inhale 2 puffs into the lungs every 6 (six) hours as needed. For wheezing         . aspirin EC 81 MG tablet   Oral   Take 1 tablet (81 mg total) by mouth daily.         . carbamazepine (TEGRETOL XR) 200 MG 12 hr tablet   Oral   Take 1 tablet (200 mg total) by mouth 2 (two) times daily. Take at night only for 7 days then increase to twice daily   60 tablet   3   . citalopram (CELEXA) 20 MG tablet   Oral   Take 10 mg by mouth daily.         . cloNIDine (CATAPRES) 0.2 MG tablet   Oral   Take 1 tablet (0.2 mg total) by mouth 2 (two) times daily.   60 tablet   6   . diclofenac sodium (VOLTAREN) 1 % GEL    Topical   Apply 2 g topically 4 (four) times daily. May apply 2-4 gm as needed   3 Tube   4   . furosemide (LASIX) 40 MG tablet   Oral   Take 1 tablet (40 mg total) by mouth 2 (two) times daily.   30 tablet   0   . insulin glargine (LANTUS) 100 UNIT/ML injection   Subcutaneous   Inject 55 Units into the skin daily.   10 mL   0   . methocarbamol (ROBAXIN) 500 MG tablet   Oral   Take 1 tablet (500 mg total) by mouth every 12 (twelve) hours. For muscle spasm   30 tablet   0   . metoprolol succinate (TOPROL-XL) 100 MG 24 hr tablet   Oral   Take 100 mg by mouth daily. Take with or immediately following a meal.         . oxyCODONE-acetaminophen (PERCOCET) 10-325 MG per tablet   Oral   Take 1 tablet by mouth every 8 (eight) hours as needed. For pain   60 tablet   0   . pantoprazole (PROTONIX) 40 MG tablet   Oral   Take 40 mg by mouth daily.          . potassium chloride (K-DUR) 10 MEQ tablet   Oral   Take 1 tablet (10 mEq total) by mouth daily.   90 tablet   3   . spironolactone (ALDACTONE) 25 MG tablet   Oral   Take 1 tablet (25 mg total) by mouth daily.   30 tablet   6     BP 182/109  Pulse 117  Temp(Src) 98.6 F (37 C) (Oral)  Resp 21  SpO2 96%  LMP 02/10/2013  Physical Exam  Nursing note and vitals reviewed. Constitutional: She is oriented to person, place, and time. She appears well-developed and well-nourished.  Non-toxic appearance. No distress.  HENT:  Head: Normocephalic and atraumatic.  Eyes: Conjunctivae, EOM and lids are normal. Pupils are equal, round, and reactive to light.  Neck: Normal range of motion. Neck supple. No tracheal deviation present. No mass present.  Cardiovascular: Regular rhythm and normal heart sounds.  Tachycardia present.  Exam reveals no gallop.   No murmur heard. Pulmonary/Chest: Effort normal. No stridor. No respiratory distress. She has decreased breath sounds. She has no wheezes.  She has rhonchi. She has no rales.   Abdominal: Soft. Normal appearance and bowel sounds are normal. She exhibits no distension. There is no tenderness. There is no rebound and no CVA tenderness.  Musculoskeletal: Normal range of motion. She exhibits no edema and no tenderness.  2 plus bilateral edema  Neurological: She is alert and oriented to person, place, and time. She has normal strength. No cranial nerve deficit or sensory deficit. GCS eye subscore is 4. GCS verbal subscore is 5. GCS motor subscore is 6.  Skin: Skin is warm and dry. No abrasion and no rash noted.  Psychiatric: She has a normal mood and affect. Her speech is normal and behavior is normal.    ED Course  Procedures (including critical care time)  Labs Reviewed  CBC WITH DIFFERENTIAL  COMPREHENSIVE METABOLIC PANEL  PRO B NATRIURETIC PEPTIDE   No results found.   No diagnosis found.    MDM   Date: 02/24/2013  Rate: 109  Rhythm: sinus tachycardia  QRS Axis: left  Intervals: normal  ST/T Wave abnormalities: nonspecific ST changes  Conduction Disutrbances:nonspecific intraventricular conduction delay  Narrative Interpretation:   Old EKG Reviewed: none available        10:16 PM Patient given Lasix for her CHF and will be admitted by [facility] hospitalist  Toy Baker, MD 02/24/13 2216

## 2013-02-24 NOTE — ED Notes (Signed)
Pt c/o central chest pain and SOB, intermittent x3 days.  Pt has hx of CHF and HTN. Reports family hx of cardiac issues.

## 2013-02-24 NOTE — ED Notes (Signed)
Admitting MD at bedside.

## 2013-02-24 NOTE — H&P (Signed)
[facility] History and Physical  [name] ZOX:[id number] DOB: Mar 19, 1966 DOA: 02/24/2013  Referring physician: EDP PCP: Quitman Livings, MD  Specialists:   Chief Complaint: Worsening SOB  HPI: [name] is a 47 y.o. female with a history of HTN, CHF, and DM2  who presents to the ED with complaints of worsening SOB, and orthopnea over the past 2 weeks.   She denies any fevers or chills.  She does report having intermittent chest pain, and dyspnea on exertion.      Review of Systems: The patient denies anorexia, fever, chills, weight loss, vision loss, decreased hearing, hoarseness, syncope, dyspnea on exertion, peripheral edema, balance deficits, hemoptysis, abdominal pain, nausea, vomiting, diarrhea, constipation, hematemesis, melena, hematochezia, severe indigestion/heartburn, hematuria, incontinence, muscle weakness, suspicious skin lesions, transient blindness, difficulty walking, depression, unusual weight change, abnormal bleeding, enlarged lymph nodes, angioedema, and breast masses.    Past Medical History  Diagnosis Date  . Hypertension     Poorly controlled. Has had HTN since age 47. Angioedema with ACEI.  24 Hr urine and renal arterial dopplers ordered . . . Never done  . Diabetes mellitus, type 2     HbA1C  11.1 8/12  . NICM (nonischemic cardiomyopathy)     EF 45-50% in 8/12, cath 6/12 showed normal coronaries, EF 50-55% by LV gram  . Morbid obesity   . Allergic rhinitis   . HLD (hyperlipidemia)     LDL 110.9 in 9/12  . OSA (obstructive sleep apnea)     sleep study in 8/12 showed moderate to severe OSA requiring CPAP  . Chronic lower back pain     secondary to DJD, obseity, hip problems. Sees Dr. Ivory Broad  . 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 Chronic diastolic heart failure      Primarily diastolic CHF: Likely due to uncontrolled HTN. Last echo (8/12) with EF 45-50%, mild to moderate LVH with some asymmetric septal hypertrophy,  RV normal size and systolic function. EF 50-55% by LV-gram in 6/12.   . Degeneration of lumbar or lumbosacral intervertebral disc   . Polyneuropathy in diabetes(357.2)   . Thoracic or lumbosacral neuritis or radiculitis, unspecified   . Calcifying tendinitis of shoulder   . LBBB (left bundle branch block)   . CHF (congestive heart failure)    Past Surgical History  Procedure Laterality Date  . Breast biopsies    . Tubal ligation  05/31/1985  . Hand surgery      Left  . Shoulder surgery      right  . Dental surgery      tumor removed   . Cardiac catheterization      Medications:  HOME MEDS: Prior to Admission medications   Medication Sig Start Date End Date Taking? Authorizing Provider  albuterol (VENTOLIN HFA) 108 (90 BASE) MCG/ACT inhaler Inhale 2 puffs into the lungs every 6 (six) hours as needed. For wheezing   Yes Historical Provider, MD  aspirin EC 81 MG tablet Take 1 tablet (81 mg total) by mouth daily. 06/09/12  Yes Laurey Morale, MD  carbamazepine (TEGRETOL XR) 200 MG 12 hr tablet Take 1 tablet (200 mg total) by mouth 2 (two) times daily. Take at night only for 7 days then increase to twice daily 04/14/12 04/14/13 Yes Ranelle Oyster, MD  citalopram (CELEXA) 20 MG tablet Take 10 mg by mouth daily. 12/10/12  Yes Rhetta Mura, MD  cloNIDine (CATAPRES) 0.2 MG tablet Take 1 tablet (  0.2 mg total) by mouth 2 (two) times daily. 06/09/12 06/09/13 Yes Laurey Morale, MD  diclofenac sodium (VOLTAREN) 1 % GEL Apply 2 g topically 4 (four) times daily. May apply 2-4 gm as needed 02/14/13  Yes Ranelle Oyster, MD  furosemide (LASIX) 40 MG tablet Take 1 tablet (40 mg total) by mouth 2 (two) times daily. 12/10/12  Yes Rhetta Mura, MD  insulin glargine (LANTUS) 100 UNIT/ML injection Inject 55 Units into the skin daily. 12/10/12  Yes Rhetta Mura, MD  methocarbamol (ROBAXIN) 500 MG tablet Take 1 tablet (500 mg total) by mouth every 12 (twelve) hours. For muscle spasm 12/10/12   Yes Rhetta Mura, MD  metoprolol succinate (TOPROL-XL) 100 MG 24 hr tablet Take 100 mg by mouth daily. Take with or immediately following a meal.   Yes Historical Provider, MD  oxyCODONE-acetaminophen (PERCOCET) 10-325 MG per tablet Take 1 tablet by mouth every 8 (eight) hours as needed. For pain 02/14/13  Yes Ranelle Oyster, MD  pantoprazole (PROTONIX) 40 MG tablet Take 40 mg by mouth daily.    Yes Historical Provider, MD  potassium chloride (K-DUR) 10 MEQ tablet Take 1 tablet (10 mEq total) by mouth daily. 04/24/12  Yes Scott Moishe Spice, PA-C  spironolactone (ALDACTONE) 25 MG tablet Take 1 tablet (25 mg total) by mouth daily. 06/21/12 06/21/13 Yes Beatrice Lecher, PA-C    Allergies:  Allergies  Allergen Reactions  . Clindamycin Other (See Comments)    REACTION: Reaction not known  . Enalapril Maleate Other (See Comments)    REACTION: Reaction not known  . Lisinopril Other (See Comments)    REACTION: Reaction not known    Social History:   reports that she has never smoked. She has never used smokeless tobacco. She reports that she does not drink alcohol or use illicit drugs.  Family History: Family History  Problem Relation Age of Onset  . Heart disease Mother   . Heart disease Paternal Grandmother   . Heart disease Paternal Grandfather   . Stroke Paternal Grandfather   . Heart disease Father     Physical Exam:  GEN:  Pleasant  47 year old Obese African American Female examined  and in no acute distress; cooperative with exam Filed Vitals:   02/24/13 1848 02/24/13 1933 02/24/13 2011 02/24/13 2139  BP: 182/109 165/111 155/95 151/99  Pulse:  105 102 105  Temp:      TempSrc:      Resp:  23 24 18   SpO2:  98% 99% 100%   Blood pressure 151/99, pulse 105, temperature 98.6 F (37 C), temperature source Oral, resp. rate 18, last menstrual period 02/12/2013, SpO2 100.00%. PSYCH: She is alert and oriented x4; does not appear anxious does not appear depressed; affect is  normal HEENT: Normocephalic and Atraumatic, Mucous membranes pink; PERRLA; EOM intact; Fundi:  Benign;  No scleral icterus, Nares: Patent, Oropharynx: Clear, Fair Dentition, Neck:  FROM, no cervical lymphadenopathy nor thyromegaly or carotid bruit; no JVD; Breasts:: Not examined CHEST WALL: No tenderness CHEST: Normal respiration, clear to auscultation bilaterally HEART: Regular rate and rhythm; no murmurs rubs or gallops BACK: No kyphosis or scoliosis; no CVA tenderness ABDOMEN: Positive Bowel Sounds, Obese, soft non-tender; no masses, no organomegaly.    Rectal Exam: Not done EXTREMITIES: No cyanosis, clubbing or edema; no ulcerations. Genitalia: not examined PULSES: 2+ and symmetric SKIN: Normal hydration no rash or ulceration CNS: Cranial nerves 2-12 grossly intact no focal neurologic deficit    Labs on Admission:  Basic Metabolic Panel:  Recent Labs Lab 02/24/13 1905  NA 133*  K 3.4*  CL 97  CO2 26  GLUCOSE 224*  BUN 10  CREATININE 0.92  CALCIUM 9.5   Liver Function Tests:  Recent Labs Lab 02/24/13 1905  AST 19  ALT 19  ALKPHOS 81  BILITOT 0.9  PROT 8.2  ALBUMIN 4.3   No results found for this basename: LIPASE, AMYLASE,  in the last 168 hours No results found for this basename: AMMONIA,  in the last 168 hours CBC:  Recent Labs Lab 02/24/13 1905  WBC 10.2  NEUTROABS 4.9  HGB 13.0  HCT 39.0  MCV 84.1  PLT 301   Cardiac Enzymes: No results found for this basename: CKTOTAL, CKMB, CKMBINDEX, TROPONINI,  in the last 168 hours  BNP (last 3 results)  Recent Labs  12/07/12 0535 12/20/12 2153 02/24/13 1905  PROBNP 288.9* 935.9* 424.3*   CBG: No results found for this basename: GLUCAP,  in the last 168 hours  Radiological Exams on Admission: Dg Chest 2 View  02/24/2013  *RADIOLOGY REPORT*  Clinical Data: Left-sided chest pain for 3 days.  Shortness of breath, dizziness.  History of CHF, hypertension, diabetes. Nonsmoker.  CHEST - 2 VIEW  Comparison:  12/20/2012.  Findings: The heart is enlarged.  There is mild perihilar bronchitic change.  No focal consolidations or pleural effusions are identified.  There is minimal left base atelectasis.  IMPRESSION:  1.  Cardiomegaly without overt pulmonary edema. 2.  Bronchitic changes and left lung base atelectasis.   Original Report Authenticated By: Norva Pavlov, M.D.    Ct Angio Chest Pe W/cm &/or Wo Cm  02/24/2013  *RADIOLOGY REPORT*  Clinical Data: Congestive heart failure.  Increasing shortness of breath, fatigue, chest pain.  CT ANGIOGRAPHY CHEST  Technique:  Multidetector CT imaging of the chest using the standard protocol during bolus administration of intravenous contrast. Multiplanar reconstructed images including MIPs were obtained and reviewed to evaluate the vascular anatomy.  Contrast: OMNIPAQUE IOHEXOL 350 MG/ML SOLN  Comparison: 07/21/2011  Findings: Technically adequate study with good opacification of the central and segmental pulmonary arteries.  No focal filling defects demonstrated.  No evidence of significant pulmonary embolus.  Mild cardiac enlargement.  Normal caliber thoracic aorta.  No abnormal mediastinal fluid collections.  Mediastinal lymph nodes are not pathologically enlarged.  The esophagus is mostly decompressed.  Small cystic structure inferior to the right inferior pulmonary vein measures about 1.8 cm.  This is stable since the previous study and may represent small bronchogenic cyst. Visualized portions of the upper abdominal organs are unremarkable. No pleural effusions.  Motion artifact limits evaluation of the lungs.  There is evidence of diffuse patchy hazy opacities in the lungs which might represent edema.  No focal consolidation.  No pneumothorax.  Airways appear patent.  Mild bronchial wall thickening suggesting chronic bronchitis.  Scattered emphysematous changes.  Mild degenerative changes in the thoracic spine.  IMPRESSION: No evidence of significant pulmonary  embolus.  Cardiac enlargement with patchy air space disease suggesting possible congestive failure and edema.  Chronic bronchitic changes.  A stable appearance of small cystic lesion in the inferior to the right inferior pulmonary vein.   Original Report Authenticated By: Burman Nieves, M.D.     EKG: Independently reviewed.   Assessment/Plan Principal Problem:   Acute on chronic diastolic CHF (congestive heart failure) Active Problems:   Shortness of breath   ESSENTIAL HYPERTENSION   OSA (obstructive sleep apnea)   Diabetes mellitus  Obesity   Hypokalemia   1.   Acute on Chronic Diastolic CHF-  CHF protocol ordered,  Diurese with IV Lasix.     2.  HTN-   Resume Metoprolol, and Clonidine and Aldactone.     3.  DM2- Continue Insulin and add SSI coverage.    4.  OSA-  CPAP qhs, ? compliance  5.  Hypokalemia- Replete K+, check Magnesium.     6.  Obesity-  Chronic.    7.  Other- DVT prophylaxis with Lovenox.       Code Status:  FULL CODE Family Communication:    Sister at Bedside Disposition Plan:   Return to Home on Discharge   Time spent: 84 Minutes  Ron Parker [facility] Pager [phone]  If 7PM-7AM, please contact night-coverage [email or web address] Password [facility] 02/24/2013, 10:46 PM

## 2013-02-25 ENCOUNTER — Encounter (HOSPITAL_COMMUNITY): Payer: Self-pay | Admitting: Internal Medicine

## 2013-02-25 DIAGNOSIS — I5033 Acute on chronic diastolic (congestive) heart failure: Secondary | ICD-10-CM

## 2013-02-25 DIAGNOSIS — I509 Heart failure, unspecified: Secondary | ICD-10-CM

## 2013-02-25 LAB — CBC
HCT: 37.5 % (ref 36.0–46.0)
MCH: 27.4 pg (ref 26.0–34.0)
MCHC: 32.5 g/dL (ref 30.0–36.0)
RDW: 13 % (ref 11.5–15.5)

## 2013-02-25 LAB — BASIC METABOLIC PANEL
BUN: 11 mg/dL (ref 6–23)
Chloride: 96 mEq/L (ref 96–112)
Creatinine, Ser: 0.95 mg/dL (ref 0.50–1.10)
GFR calc Af Amer: 82 mL/min — ABNORMAL LOW (ref >90)
Glucose, Bld: 281 mg/dL — ABNORMAL HIGH (ref 70–99)

## 2013-02-25 LAB — GLUCOSE, CAPILLARY
Glucose-Capillary: 322 mg/dL — ABNORMAL HIGH (ref 70–99)
Glucose-Capillary: 191 mg/dL — ABNORMAL HIGH (ref 70–99)

## 2013-02-25 MED ORDER — POLYETHYLENE GLYCOL 3350 17 G PO PACK
17.0000 g | PACK | Freq: Every day | ORAL | Status: DC
  Administered 2013-02-25 – 2013-02-27 (×3): 17 g via ORAL
  Filled 2013-02-25 (×3): qty 1

## 2013-02-25 MED ORDER — INSULIN ASPART 100 UNIT/ML ~~LOC~~ SOLN: 0.0000 [IU] | Freq: Every day | SUBCUTANEOUS | Status: DC

## 2013-02-25 MED ORDER — INSULIN ASPART 100 UNIT/ML ~~LOC~~ SOLN
0.0000 [IU] | Freq: Three times a day (TID) | SUBCUTANEOUS | Status: DC
  Administered 2013-02-25: 15 [IU] via SUBCUTANEOUS
  Administered 2013-02-25 – 2013-02-26 (×3): 4 [IU] via SUBCUTANEOUS
  Administered 2013-02-26 – 2013-02-27 (×2): 7 [IU] via SUBCUTANEOUS
  Administered 2013-02-27: 4 [IU] via SUBCUTANEOUS

## 2013-02-25 MED ORDER — INSULIN ASPART 100 UNIT/ML ~~LOC~~ SOLN
6.0000 [IU] | Freq: Three times a day (TID) | SUBCUTANEOUS | Status: DC
  Administered 2013-02-25 – 2013-02-26 (×4): 6 [IU] via SUBCUTANEOUS

## 2013-02-25 NOTE — Plan of Care (Signed)
Problem: Consults Goal: Heart Failure Patient Education (See Patient Education module for education specifics.) Outcome: Progressing Heart Failure booklet given.

## 2013-02-25 NOTE — Progress Notes (Signed)
[facility] PROGRESS NOTE  [name] JYN:[id number] DOB: 1966/11/16 DOA: 02/24/2013 PCP: Quitman Livings, MD  Brief narrative: [name] is an 47 y.o. female with a past medical history of hypertension, congestive heart failure, and type 2 diabetes who was admitted to the hospital on 02/24/2013 with acute on chronic diastolic heart failure.  Assessment/Plan: Principal Problem:   Acute on chronic systolic and diastolic CHF (congestive heart failure) with shortness of breath -Admitted to telemetry and given Lasix 60 mg x1 then put on 80 mg IV every 12 hours. -I and O. balance down approximately 1 L. the patient reports some improvement in symptoms. -2-D echo done 12/06/2012. The study was limited. EF 40-45% with  high ventricular filling pressures. -Spent 20 minutes reviewing heart failure and educating her on the importance of hypertension and diabetes control to prevent deterioration of heart function. Active Problems:   Constipation -MiraLAX ordered.   ESSENTIAL HYPERTENSION -Continue metoprolol, clonidine, and Aldactone as well as higher dose Lasix.   OSA (obstructive sleep apnea) -CPAP each bedtime ordered.   Diabetes mellitus, uncontrolled -CBGs 218-284. -Continue Lantus 55 units each bedtime, change sliding scale to insulin resistant and add 6 units of NovoLog before every meal. -Last hemoglobin A1c done 12/07/2012:11.4%.   Obesity -Continue carbohydrate modified diet.   Hypokalemia -Monitor and replace potassium as needed.   Code Status: Full. Family Communication: None at bedside. Disposition Plan: Home when stable.   Medical Consultants:  None.  Other Consultants:  Diabetes coordinator  Anti-infectives:  None.  HPI/Subjective: [name] states that her breathing is a little better this morning. She no longer has any chest pain. She continues to report some problems with dizziness. Appetite is good. Some constipation, for which we have  prescribed MiraLAX.  Objective: Filed Vitals:   02/24/13 2139 02/24/13 2342 02/25/13 0200 02/25/13 0500  BP: 151/99 142/81 115/73 130/78  Pulse: 105 106 84 88  Temp:  98 F (36.7 C)  98 F (36.7 C)  TempSrc:  Oral  Oral  Resp: 18 20  18   Height:  5\' 9"  (1.753 m)    Weight:  127 kg (279 lb 15.8 oz)  127 kg (279 lb 15.8 oz)  SpO2: 100% 100%  98%    Intake/Output Summary (Last 24 hours) at 02/25/13 0802 Last data filed at 02/25/13 0500  Gross per 24 hour  Intake    360 ml  Output   1400 ml  Net  -1040 ml    Exam: Gen:  NAD Cardiovascular:  RRR, No M/R/G Respiratory:  Lungs CTAB Gastrointestinal:  Abdomen softly distended, nontender, + BS Extremities:  Trace edema  Data Reviewed: Basic Metabolic Panel:  Recent Labs Lab 02/24/13 1905 02/25/13 0426  NA 133* 132*  K 3.4* 3.7  CL 97 96  CO2 26 28  GLUCOSE 224* 281*  BUN 10 11  CREATININE 0.92 0.95  CALCIUM 9.5 8.9   GFR Estimated Creatinine Clearance: 105.7 ml/min (by C-G formula based on Cr of 0.95). Liver Function Tests:  Recent Labs Lab 02/24/13 1905  AST 19  ALT 19  ALKPHOS 81  BILITOT 0.9  PROT 8.2  ALBUMIN 4.3    CBC:  Recent Labs Lab 02/24/13 1905 02/25/13 0426  WBC 10.2 10.1  NEUTROABS 4.9  --   HGB 13.0 12.2  HCT 39.0 37.5  MCV 84.1 84.1  PLT 301 261   BNP (last 3 results)  Recent Labs  12/07/12 0535 12/20/12 2153 02/24/13 1905  PROBNP 288.9* 935.9* 424.3*  CBG:  Recent Labs Lab 02/24/13 2350 02/25/13 0324 02/25/13 0739  GLUCAP 218* 284* 245*    Procedures and Diagnostic Studies: Dg Chest 2 View  02/24/2013  *RADIOLOGY REPORT*  Clinical Data: Left-sided chest pain for 3 days.  Shortness of breath, dizziness.  History of CHF, hypertension, diabetes. Nonsmoker.  CHEST - 2 VIEW  Comparison: 12/20/2012.  Findings: The heart is enlarged.  There is mild perihilar bronchitic change.  No focal consolidations or pleural effusions are identified.  There is minimal left base  atelectasis.  IMPRESSION:  1.  Cardiomegaly without overt pulmonary edema. 2.  Bronchitic changes and left lung base atelectasis.   Original Report Authenticated By: Norva Pavlov, M.D.    Ct Angio Chest Pe W/cm &/or Wo Cm  02/24/2013  *RADIOLOGY REPORT*  Clinical Data: Congestive heart failure.  Increasing shortness of breath, fatigue, chest pain.  CT ANGIOGRAPHY CHEST  Technique:  Multidetector CT imaging of the chest using the standard protocol during bolus administration of intravenous contrast. Multiplanar reconstructed images including MIPs were obtained and reviewed to evaluate the vascular anatomy.  Contrast: OMNIPAQUE IOHEXOL 350 MG/ML SOLN  Comparison: 07/21/2011  Findings: Technically adequate study with good opacification of the central and segmental pulmonary arteries.  No focal filling defects demonstrated.  No evidence of significant pulmonary embolus.  Mild cardiac enlargement.  Normal caliber thoracic aorta.  No abnormal mediastinal fluid collections.  Mediastinal lymph nodes are not pathologically enlarged.  The esophagus is mostly decompressed.  Small cystic structure inferior to the right inferior pulmonary vein measures about 1.8 cm.  This is stable since the previous study and may represent small bronchogenic cyst. Visualized portions of the upper abdominal organs are unremarkable. No pleural effusions.  Motion artifact limits evaluation of the lungs.  There is evidence of diffuse patchy hazy opacities in the lungs which might represent edema.  No focal consolidation.  No pneumothorax.  Airways appear patent.  Mild bronchial wall thickening suggesting chronic bronchitis.  Scattered emphysematous changes.  Mild degenerative changes in the thoracic spine.  IMPRESSION: No evidence of significant pulmonary embolus.  Cardiac enlargement with patchy air space disease suggesting possible congestive failure and edema.  Chronic bronchitic changes.  A stable appearance of small cystic lesion  in the inferior to the right inferior pulmonary vein.   Original Report Authenticated By: Burman Nieves, M.D.     Scheduled Meds: . aspirin EC  325 mg Oral Daily  . carbamazepine  200 mg Oral BID  . citalopram  10 mg Oral Daily  . cloNIDine  0.2 mg Oral BID  . enoxaparin (LOVENOX) injection  40 mg Subcutaneous Q24H  . furosemide  80 mg Intravenous Q12H  . insulin aspart  0-5 Units Subcutaneous QHS  . insulin aspart  0-9 Units Subcutaneous TID WC  . insulin glargine  55 Units Subcutaneous Daily  . metoprolol succinate  100 mg Oral Daily  . pantoprazole  40 mg Oral Daily  . potassium chloride  10 mEq Oral Daily  . potassium chloride  20 mEq Oral BID  . sodium chloride  3 mL Intravenous Q12H  . sodium chloride  3 mL Intravenous Q12H  . sodium chloride  3 mL Intravenous Q12H  . spironolactone  25 mg Oral Daily   Continuous Infusions: . sodium chloride Stopped (02/24/13 2247)    Time spent: 40 minutes   LOS: 1 day   RAMA,CHRISTINA  [facility] Pager [phone].  If 8PM-8AM, please contact night-coverage at [email or web address], password [facility] 02/25/2013, 8:02 AM

## 2013-02-26 LAB — GLUCOSE, CAPILLARY: Glucose-Capillary: 172 mg/dL — ABNORMAL HIGH (ref 70–99)

## 2013-02-26 MED ORDER — INSULIN ASPART 100 UNIT/ML ~~LOC~~ SOLN
7.0000 [IU] | Freq: Three times a day (TID) | SUBCUTANEOUS | Status: DC
  Administered 2013-02-26 – 2013-02-27 (×3): 7 [IU] via SUBCUTANEOUS

## 2013-02-26 MED ORDER — INSULIN GLARGINE 100 UNIT/ML ~~LOC~~ SOLN
65.0000 [IU] | Freq: Every day | SUBCUTANEOUS | Status: DC
  Administered 2013-02-27: 65 [IU] via SUBCUTANEOUS

## 2013-02-26 NOTE — Progress Notes (Signed)
[facility] PROGRESS NOTE  [name] ZOX:[id number] DOB: 01-25-1966 DOA: 02/24/2013 PCP: Quitman Livings, MD  Brief narrative: [name] is an 47 y.o. female with a past medical history of hypertension, congestive heart failure, and type 2 diabetes who was admitted to the hospital on 02/24/2013 with acute on chronic diastolic heart failure.  Assessment/Plan: Principal Problem:   Acute on chronic systolic and diastolic CHF (congestive heart failure) with shortness of breath -Admitted to telemetry and given Lasix 60 mg x1 then put on 80 mg IV every 12 hours. -I and O. balance down approximately 2.3 L since admission. The patient reports some improvement in symptoms, but not yet back to baseline. -2-D echo done 12/06/2012. The study was limited. EF 40-45% with  high ventricular filling pressures. -Educated 02/25/13. Active Problems:   Constipation -MiraLAX ordered.   ESSENTIAL HYPERTENSION -Continue metoprolol, clonidine, and Aldactone as well as higher dose Lasix.   OSA (obstructive sleep apnea) -CPAP each bedtime ordered.   Diabetes mellitus, uncontrolled -CBGs 159-322. -Increase Lantus to 65 units each bedtime, continue insulin resistant sliding scale and increase NovoLog to 7 units before every meal. -Last hemoglobin A1c done 12/07/2012:11.4%. -DM coordinator consult requestd.   Obesity -Continue carbohydrate modified diet.   Hypokalemia -Monitor and replace potassium as needed.   Code Status: Full. Family Communication: None at bedside. Disposition Plan: Home when stable.   Medical Consultants:  None.  Other Consultants:  Diabetes coordinator  Anti-infectives:  None.  HPI/Subjective: [name] is less dyspneic at rest, but continues to get very short of breath with limited exertion.  She does not feel back to baseline yet. No chest pain or cough.    Objective: Filed Vitals:   02/25/13 1535 02/25/13 2129 02/26/13 0150 02/26/13 0532  BP:  112/68 108/77 160/52 125/74  Pulse: 91  90 80  Temp: 98.1 F (36.7 C)  98 F (36.7 C) 98 F (36.7 C)  TempSrc: Oral  Oral Oral  Resp: 18  18 20   Height:      Weight:    124.5 kg (274 lb 7.6 oz)  SpO2: 91%   97%    Intake/Output Summary (Last 24 hours) at 02/26/13 1310 Last data filed at 02/26/13 0900  Gross per 24 hour  Intake   1420 ml  Output   2700 ml  Net  -1280 ml    Exam: Gen:  NAD Cardiovascular:  RRR, No M/R/G Respiratory:  Lungs CTAB Gastrointestinal:  Abdomen softly distended, nontender, + BS Extremities:  Trace edema  Data Reviewed: Basic Metabolic Panel:  Recent Labs Lab 02/24/13 1905 02/25/13 0426  NA 133* 132*  K 3.4* 3.7  CL 97 96  CO2 26 28  GLUCOSE 224* 281*  BUN 10 11  CREATININE 0.92 0.95  CALCIUM 9.5 8.9   GFR Estimated Creatinine Clearance: 104.5 ml/min (by C-G formula based on Cr of 0.95). Liver Function Tests:  Recent Labs Lab 02/24/13 1905  AST 19  ALT 19  ALKPHOS 81  BILITOT 0.9  PROT 8.2  ALBUMIN 4.3    CBC:  Recent Labs Lab 02/24/13 1905 02/25/13 0426  WBC 10.2 10.1  NEUTROABS 4.9  --   HGB 13.0 12.2  HCT 39.0 37.5  MCV 84.1 84.1  PLT 301 261   BNP (last 3 results)  Recent Labs  12/07/12 0535 12/20/12 2153 02/24/13 1905  PROBNP 288.9* 935.9* 424.3*   CBG:  Recent Labs Lab 02/25/13 1155 02/25/13 1634 02/25/13 2212 02/26/13 0755 02/26/13 1140  GLUCAP 322*  191* 159* 172* 238*    Procedures and Diagnostic Studies: Dg Chest 2 View  02/24/2013  *RADIOLOGY REPORT*  Clinical Data: Left-sided chest pain for 3 days.  Shortness of breath, dizziness.  History of CHF, hypertension, diabetes. Nonsmoker.  CHEST - 2 VIEW  Comparison: 12/20/2012.  Findings: The heart is enlarged.  There is mild perihilar bronchitic change.  No focal consolidations or pleural effusions are identified.  There is minimal left base atelectasis.  IMPRESSION:  1.  Cardiomegaly without overt pulmonary edema. 2.  Bronchitic changes and  left lung base atelectasis.   Original Report Authenticated By: Norva Pavlov, M.D.    Ct Angio Chest Pe W/cm &/or Wo Cm  02/24/2013  *RADIOLOGY REPORT*  Clinical Data: Congestive heart failure.  Increasing shortness of breath, fatigue, chest pain.  CT ANGIOGRAPHY CHEST  Technique:  Multidetector CT imaging of the chest using the standard protocol during bolus administration of intravenous contrast. Multiplanar reconstructed images including MIPs were obtained and reviewed to evaluate the vascular anatomy.  Contrast: OMNIPAQUE IOHEXOL 350 MG/ML SOLN  Comparison: 07/21/2011  Findings: Technically adequate study with good opacification of the central and segmental pulmonary arteries.  No focal filling defects demonstrated.  No evidence of significant pulmonary embolus.  Mild cardiac enlargement.  Normal caliber thoracic aorta.  No abnormal mediastinal fluid collections.  Mediastinal lymph nodes are not pathologically enlarged.  The esophagus is mostly decompressed.  Small cystic structure inferior to the right inferior pulmonary vein measures about 1.8 cm.  This is stable since the previous study and may represent small bronchogenic cyst. Visualized portions of the upper abdominal organs are unremarkable. No pleural effusions.  Motion artifact limits evaluation of the lungs.  There is evidence of diffuse patchy hazy opacities in the lungs which might represent edema.  No focal consolidation.  No pneumothorax.  Airways appear patent.  Mild bronchial wall thickening suggesting chronic bronchitis.  Scattered emphysematous changes.  Mild degenerative changes in the thoracic spine.  IMPRESSION: No evidence of significant pulmonary embolus.  Cardiac enlargement with patchy air space disease suggesting possible congestive failure and edema.  Chronic bronchitic changes.  A stable appearance of small cystic lesion in the inferior to the right inferior pulmonary vein.   Original Report Authenticated By: Burman Nieves, M.D.     Scheduled Meds: . aspirin EC  325 mg Oral Daily  . carbamazepine  200 mg Oral BID  . citalopram  10 mg Oral Daily  . cloNIDine  0.2 mg Oral BID  . enoxaparin (LOVENOX) injection  40 mg Subcutaneous Q24H  . furosemide  80 mg Intravenous Q12H  . insulin aspart  0-20 Units Subcutaneous TID WC  . insulin aspart  0-5 Units Subcutaneous QHS  . insulin aspart  6 Units Subcutaneous TID WC  . insulin glargine  55 Units Subcutaneous Daily  . metoprolol succinate  100 mg Oral Daily  . pantoprazole  40 mg Oral Daily  . polyethylene glycol  17 g Oral Daily  . sodium chloride  3 mL Intravenous Q12H  . spironolactone  25 mg Oral Daily   Continuous Infusions: . sodium chloride Stopped (02/24/13 2247)    Time spent: 25 minutes   LOS: 2 days   RAMA,CHRISTINA  [facility] Pager [phone].  If 8PM-8AM, please contact night-coverage at [email or web address], password [facility] 02/26/2013, 1:10 PM

## 2013-02-26 NOTE — Progress Notes (Signed)
Inpatient Diabetes Program Recommendations  AACE/ADA: New Consensus Statement on Inpatient Glycemic Control (2013)  Target Ranges:  Prepandial:   less than 140 mg/dL      Peak postprandial:   less than 180 mg/dL (1-2 hours)      Critically ill patients:  140 - 180 mg/dL   Reason for Visit: Diabetes Consult Uncontrolled DM  47 y.o. female with a past medical history of hypertension, congestive heart failure, and type 2 diabetes who was admitted to the hospital on 02/24/2013 with acute on chronic diastolic heart failure. "I need a good doctor for my diabetes and blood pressure." States she doesn't feel like she was taking her medicine correctly, thinks she needs more insulin.  Pt states she was taking Lantus 25 units QHS and Humalog 10 units tid if blood sugars were>250 mg/dL. Complains of polyuria, polydipsia and blurred vision.  Checks blood sugars 2 - 3 times/day. Eating 100%. States she does not have problems getting insulin, supplies, strips, etc.   Inpatient Diabetes Program Recommendations Insulin - Basal: Increase Lantus to 65 units QAM HgbA1C: Need updated HgbA1C - Last one 12/07/2013 was 11.4% Outpatient Referral: [facility] uncontrolled DM May benefit from increase in Novolog meal coverage to 10 units tidwc. Recommend case management consult for assistance with obtaining PCP to manage DM.  Pt has Medicaid. Will follow.  Thank you. Ailene Ards, RD, LDN, CDE Inpatient Diabetes Coordinator [phone]

## 2013-02-26 NOTE — Care Management Note (Addendum)
    Page 1 of 1   02/27/2013     2:34:00 PM   CARE MANAGEMENT NOTE 02/27/2013  Patient:  AILAH, BARNA   Account Number:  [id number]  Date Initiated:  02/26/2013  Documentation initiated by:  Lanier Clam  Subjective/Objective Assessment:   ADMITTED W/SOB.LCHF.     Action/Plan:   FROM HOME.HAS PCP,PHARMACY.   Anticipated DC Date:  02/27/2013   Anticipated DC Plan:  HOME/SELF CARE  In-house referral  PCP / [facility]      DC Planning Services  CM consult      Choice offered to / List presented to:             Status of service:  Completed, signed off Medicare Important Message given?   (If response is "NO", the following Medicare IM given date fields will be blank) Date Medicare IM given:   Date Additional Medicare IM given:    Discharge Disposition:  HOME/SELF CARE  Per UR Regulation:  Reviewed for med. necessity/level of care/duration of stay  If discussed at Long Length of Stay Meetings, dates discussed:    Comments:  02/27/13 Liban Guedes RN,BSN NCM [phone] PROVIDED PATIENT W/PCP LISTING,ENCOURAGED THE IMPORTANCE OF UTILIZING OUR PHYSICIAN REFERRAL SERVICE COURT[facility] TEL# WHILE @ HOSPITAL,ALSO PROVIDED W/OTHER COMMUNITY RESOURCES-COUNSELING,$[id number] MED LIST.PROVIDED [facility] AGENCY LIST.RECOMMEND HHRN-DISEASE MGMNT.  02/26/13 Esker Dever RN,BSN NCM [phone]

## 2013-02-27 LAB — GLUCOSE, CAPILLARY
Glucose-Capillary: 216 mg/dL — ABNORMAL HIGH (ref 70–99)
Glucose-Capillary: 152 mg/dL — ABNORMAL HIGH (ref 70–99)

## 2013-02-27 MED ORDER — FUROSEMIDE 40 MG PO TABS: 40.0000 mg | ORAL_TABLET | Freq: Two times a day (BID) | ORAL | Status: DC

## 2013-02-27 MED ORDER — INSULIN ASPART 100 UNIT/ML ~~LOC~~ SOLN: 7.0000 [IU] | Freq: Three times a day (TID) | SUBCUTANEOUS | Status: DC

## 2013-02-27 MED ORDER — INSULIN GLARGINE 100 UNIT/ML ~~LOC~~ SOLN: 65.0000 [IU] | Freq: Every day | SUBCUTANEOUS | Status: DC

## 2013-02-27 NOTE — Progress Notes (Signed)
Discharge to home, mother at bedside, d/c instructions  And follow up appointment done and given to the patient. PIV removed no s/s of infiltration or swelling noted.

## 2013-02-27 NOTE — Discharge Summary (Signed)
Physician Discharge Summary  [name] HYQ:[id number] DOB: [birth year] DOA: 02/24/2013  PCP: Quitman Livings, MD  Admit date: 02/24/2013 Discharge date: 02/27/2013  Recommendations for Outpatient Follow-up:  1. F/U with Dr. Roseanne Reno or a PCP of your choice in 1 week for hospital F/U and to reassess glycemic control. 2. Recommend recheck of hemoglobin A1c at your next MD visit.  Discharge Diagnoses:  Principal Problem:    Acute on chronic combined systolic and diastolic CHF (congestive heart failure) Active Problems:    ESSENTIAL HYPERTENSION    OSA (obstructive sleep apnea)    Diabetes mellitus    Obesity    Shortness of breath    Hypokalemia   Discharge Condition: Improved.  Diet recommendation: Carbohydrate modified, low sodium, heart healthy.  History of present illness:  [name] is an 47 y.o. female with a past medical history of hypertension, congestive heart failure, and type 2 diabetes who was admitted to the hospital on 02/24/2013 with acute on chronic diastolic heart failure.  Hospital Course by problem:  Principal Problem:  Acute on chronic systolic and diastolic CHF (congestive heart failure) with shortness of breath  -Admitted to telemetry and given Lasix 60 mg x1 then put on 80 mg IV every 12 hours.  -I and O. Balance watched closely.  Appropriately diuresed.  -2-D echo done 12/06/2012. The study was limited. EF 40-45% with high ventricular filling pressures.  -Educated 02/25/13. Told to weigh herself daily and to give herself an extra dose of Lasix 40 mg if she gains > 2-3 lbs in a 24 hour period of time. Active Problems:  Constipation  -MiraLAX given.  ESSENTIAL HYPERTENSION  -Continue metoprolol, clonidine, and Aldactone as well as Lasix.  OSA (obstructive sleep apnea)  -Continue CPAP each bedtime.  Diabetes mellitus, uncontrolled  -Increase Lantus to 65 units each bedtime, instructed to take NovoLog to 7 units before every meal.  -Last  hemoglobin A1c done 12/07/2012:11.4%. Recommend re-check at next MD visit. -DM coordinator saw patient 02/26/13.  Obesity  -Continue carbohydrate modified diet.  -Weight loss encouraged. Hypokalemia  -Corrected.   Procedures:  None.  Consultations:  DM coordinator.  Discharge Exam: Filed Vitals:   02/27/13 0627  BP: 121/80  Pulse: 82  Temp: 97.9 F (36.6 C)  Resp: 18   Filed Vitals:   02/26/13 1335 02/26/13 1651 02/26/13 1900 02/27/13 0627  BP: 127/81 138/93 113/73 121/80  Pulse: 81 79 77 82  Temp:   97.5 F (36.4 C) 97.9 F (36.6 C)  TempSrc: Oral  Oral Oral  Resp: 20  20 18   Height:      Weight:    125.7 kg (277 lb 1.9 oz)  SpO2: 97%  97% 97%    Gen:  NAD Cardiovascular:  RRR, No M/R/G Respiratory: Lungs CTAB Gastrointestinal: Abdomen soft, NT/ND with normal active bowel sounds. Extremities: No C/E/C   Discharge Instructions  Discharge Orders   Future Appointments Provider Department Dept Phone   04/02/2013 12:20 PM Ranelle Oyster, MD [facility] [phone]   Future Orders Complete By Expires     (HEART FAILURE PATIENTS) Call MD:  Anytime you have any of the following symptoms: 1) 3 pound weight gain in 24 hours or 5 pounds in 1 week 2) shortness of breath, with or without a dry hacking cough 3) swelling in the hands, feet or stomach 4) if you have to sleep on extra pillows at night in order to breathe.  As directed  Call MD for:  As directed     Scheduling Instructions:      Worsening shortness of breath.    Diet - low sodium heart healthy  As directed     Diet Carb Modified  As directed     Discharge instructions  As directed     Comments:      Avoid salt.  Weigh yourself daily.  If you gain more than 2-3 lbs in a 24 hour period of time, take an extra 40 mg dose of Lasix.    Increase activity slowly  As directed         Medication List    TAKE these medications       aspirin EC 81 MG tablet  Take 1  tablet (81 mg total) by mouth daily.     carbamazepine 200 MG 12 hr tablet  Commonly known as:  TEGRETOL XR  Take 1 tablet (200 mg total) by mouth 2 (two) times daily. Take at night only for 7 days then increase to twice daily     citalopram 20 MG tablet  Commonly known as:  CELEXA  Take 10 mg by mouth daily.     cloNIDine 0.2 MG tablet  Commonly known as:  CATAPRES  Take 1 tablet (0.2 mg total) by mouth 2 (two) times daily.     diclofenac sodium 1 % Gel  Commonly known as:  VOLTAREN  Apply 2 g topically 4 (four) times daily. May apply 2-4 gm as needed     furosemide 40 MG tablet  Commonly known as:  LASIX  Take 1 tablet (40 mg total) by mouth 2 (two) times daily. If you gain any more than 2-3 pounds in a 24 hour period of time, take an extra dose of lasix.     insulin aspart 100 UNIT/ML injection  Commonly known as:  NOVOLOG FLEXPEN  Inject 7 Units into the skin 3 (three) times daily before meals.     insulin glargine 100 UNIT/ML injection  Commonly known as:  LANTUS  Inject 0.65 mLs (65 Units total) into the skin daily.     methocarbamol 500 MG tablet  Commonly known as:  ROBAXIN  Take 1 tablet (500 mg total) by mouth every 12 (twelve) hours. For muscle spasm     metoprolol succinate 100 MG 24 hr tablet  Commonly known as:  TOPROL-XL  Take 100 mg by mouth daily. Take with or immediately following a meal.     oxyCODONE-acetaminophen 10-325 MG per tablet  Commonly known as:  PERCOCET  Take 1 tablet by mouth every 8 (eight) hours as needed. For pain     pantoprazole 40 MG tablet  Commonly known as:  PROTONIX  Take 40 mg by mouth daily.     potassium chloride 10 MEQ tablet  Commonly known as:  K-DUR  Take 1 tablet (10 mEq total) by mouth daily.     spironolactone 25 MG tablet  Commonly known as:  ALDACTONE  Take 1 tablet (25 mg total) by mouth daily.     VENTOLIN HFA 108 (90 BASE) MCG/ACT inhaler  Generic drug:  albuterol  Inhale 2 puffs into the lungs every 6  (six) hours as needed. For wheezing           Follow-up Information   Follow up with [facility], MD. Schedule an appointment as soon as possible for a visit in 1 week. [facility] F/U)    Contact information:   [address]  [place] [address] [phone]       Follow up with Or a PCP of your choice. Schedule an appointment as soon as possible for a visit in 1 week.       The results of significant diagnostics from this hospitalization (including imaging, microbiology, ancillary and laboratory) are listed below for reference.    Significant Diagnostic Studies: Dg Chest 2 View  02/24/2013  *RADIOLOGY REPORT*  Clinical Data: Left-sided chest pain for 3 days.  Shortness of breath, dizziness.  History of CHF, hypertension, diabetes. Nonsmoker.  CHEST - 2 VIEW  Comparison: 12/20/2012.  Findings: The heart is enlarged.  There is mild perihilar bronchitic change.  No focal consolidations or pleural effusions are identified.  There is minimal left base atelectasis.  IMPRESSION:  1.  Cardiomegaly without overt pulmonary edema. 2.  Bronchitic changes and left lung base atelectasis.   Original Report Authenticated By: Norva Pavlov, M.D.    Ct Angio Chest Pe W/cm &/or Wo Cm  02/24/2013  *RADIOLOGY REPORT*  Clinical Data: Congestive heart failure.  Increasing shortness of breath, fatigue, chest pain.  CT ANGIOGRAPHY CHEST  Technique:  Multidetector CT imaging of the chest using the standard protocol during bolus administration of intravenous contrast. Multiplanar reconstructed images including MIPs were obtained and reviewed to evaluate the vascular anatomy.  Contrast: OMNIPAQUE IOHEXOL 350 MG/ML SOLN  Comparison: 07/21/2011  Findings: Technically adequate study with good opacification of the central and segmental pulmonary arteries.  No focal filling defects demonstrated.  No evidence of significant pulmonary embolus.  Mild cardiac enlargement.  Normal caliber thoracic  aorta.  No abnormal mediastinal fluid collections.  Mediastinal lymph nodes are not pathologically enlarged.  The esophagus is mostly decompressed.  Small cystic structure inferior to the right inferior pulmonary vein measures about 1.8 cm.  This is stable since the previous study and may represent small bronchogenic cyst. Visualized portions of the upper abdominal organs are unremarkable. No pleural effusions.  Motion artifact limits evaluation of the lungs.  There is evidence of diffuse patchy hazy opacities in the lungs which might represent edema.  No focal consolidation.  No pneumothorax.  Airways appear patent.  Mild bronchial wall thickening suggesting chronic bronchitis.  Scattered emphysematous changes.  Mild degenerative changes in the thoracic spine.  IMPRESSION: No evidence of significant pulmonary embolus.  Cardiac enlargement with patchy air space disease suggesting possible congestive failure and edema.  Chronic bronchitic changes.  A stable appearance of small cystic lesion in the inferior to the right inferior pulmonary vein.   Original Report Authenticated By: Burman Nieves, M.D.     Labs:  Basic Metabolic Panel:  Recent Labs Lab 02/24/13 1905 02/25/13 0426  NA 133* 132*  K 3.4* 3.7  CL 97 96  CO2 26 28  GLUCOSE 224* 281*  BUN 10 11  CREATININE 0.92 0.95  CALCIUM 9.5 8.9   GFR Estimated Creatinine Clearance: 105.1 ml/min (by C-G formula based on Cr of 0.95). Liver Function Tests:  Recent Labs Lab 02/24/13 1905  AST 19  ALT 19  ALKPHOS 81  BILITOT 0.9  PROT 8.2  ALBUMIN 4.3   CBC:  Recent Labs Lab 02/24/13 1905 02/25/13 0426  WBC 10.2 10.1  NEUTROABS 4.9  --   HGB 13.0 12.2  HCT 39.0 37.5  MCV 84.1 84.1  PLT 301 261   CBG:  Recent Labs Lab 02/26/13 1140 02/26/13 1637 02/26/13 2046 02/27/13 0811 02/27/13 1242  GLUCAP 238* 170* 87 216* 152*   Thyroid function studies  Recent Labs  02/25/13 0426  TSH 3.548    Time coordinating  discharge: 35 minutes.  Signed:  Lashunta Frieden  Pager [phone] [facility] 02/27/2013, 2:18 PM

## 2013-03-19 ENCOUNTER — Telehealth: Payer: Self-pay

## 2013-03-19 DIAGNOSIS — I10 Essential (primary) hypertension: Secondary | ICD-10-CM

## 2013-03-19 DIAGNOSIS — E669 Obesity, unspecified: Secondary | ICD-10-CM

## 2013-03-19 DIAGNOSIS — M47816 Spondylosis without myelopathy or radiculopathy, lumbar region: Secondary | ICD-10-CM

## 2013-03-19 DIAGNOSIS — E1142 Type 2 diabetes mellitus with diabetic polyneuropathy: Secondary | ICD-10-CM

## 2013-03-19 MED ORDER — OXYCODONE-ACETAMINOPHEN 10-325 MG PO TABS: 1.0000 | ORAL_TABLET | Freq: Three times a day (TID) | ORAL | Status: DC | PRN

## 2013-03-19 NOTE — Telephone Encounter (Signed)
Patient called requesting percocet refills.  Order placed.  Patient aware, will be ready for pick up this afternoon.

## 2013-03-28 ENCOUNTER — Ambulatory Visit: Payer: Medicaid Other | Admitting: Physical Medicine & Rehabilitation

## 2013-04-01 ENCOUNTER — Emergency Department (HOSPITAL_COMMUNITY): Payer: Medicaid Other

## 2013-04-01 ENCOUNTER — Encounter (HOSPITAL_COMMUNITY): Payer: Self-pay | Admitting: *Deleted

## 2013-04-01 ENCOUNTER — Emergency Department (HOSPITAL_COMMUNITY)
Admission: EM | Admit: 2013-04-01 | Discharge: 2013-04-01 | Disposition: A | Discharge status: Home / Self Care | Payer: Medicaid Other | Attending: Emergency Medicine | Admitting: Emergency Medicine

## 2013-04-01 DIAGNOSIS — E1142 Type 2 diabetes mellitus with diabetic polyneuropathy: Secondary | ICD-10-CM | POA: Insufficient documentation

## 2013-04-01 DIAGNOSIS — I2510 Atherosclerotic heart disease of native coronary artery without angina pectoris: Secondary | ICD-10-CM | POA: Insufficient documentation

## 2013-04-01 DIAGNOSIS — I5042 Chronic combined systolic (congestive) and diastolic (congestive) heart failure: Secondary | ICD-10-CM | POA: Insufficient documentation

## 2013-04-01 DIAGNOSIS — Z862 Personal history of diseases of the blood and blood-forming organs and certain disorders involving the immune mechanism: Secondary | ICD-10-CM | POA: Insufficient documentation

## 2013-04-01 DIAGNOSIS — Z794 Long term (current) use of insulin: Secondary | ICD-10-CM | POA: Insufficient documentation

## 2013-04-01 DIAGNOSIS — Z79899 Other long term (current) drug therapy: Secondary | ICD-10-CM | POA: Insufficient documentation

## 2013-04-01 DIAGNOSIS — Z9861 Coronary angioplasty status: Secondary | ICD-10-CM | POA: Insufficient documentation

## 2013-04-01 DIAGNOSIS — R079 Chest pain, unspecified: Secondary | ICD-10-CM | POA: Insufficient documentation

## 2013-04-01 DIAGNOSIS — Z8639 Personal history of other endocrine, nutritional and metabolic disease: Secondary | ICD-10-CM | POA: Insufficient documentation

## 2013-04-01 DIAGNOSIS — Z8679 Personal history of other diseases of the circulatory system: Secondary | ICD-10-CM | POA: Insufficient documentation

## 2013-04-01 DIAGNOSIS — I5032 Chronic diastolic (congestive) heart failure: Secondary | ICD-10-CM | POA: Insufficient documentation

## 2013-04-01 DIAGNOSIS — I10 Essential (primary) hypertension: Secondary | ICD-10-CM | POA: Insufficient documentation

## 2013-04-01 DIAGNOSIS — E119 Type 2 diabetes mellitus without complications: Secondary | ICD-10-CM | POA: Insufficient documentation

## 2013-04-01 DIAGNOSIS — Z7982 Long term (current) use of aspirin: Secondary | ICD-10-CM | POA: Insufficient documentation

## 2013-04-01 DIAGNOSIS — E1149 Type 2 diabetes mellitus with other diabetic neurological complication: Secondary | ICD-10-CM | POA: Insufficient documentation

## 2013-04-01 DIAGNOSIS — Z8669 Personal history of other diseases of the nervous system and sense organs: Secondary | ICD-10-CM | POA: Insufficient documentation

## 2013-04-01 DIAGNOSIS — Z8739 Personal history of other diseases of the musculoskeletal system and connective tissue: Secondary | ICD-10-CM | POA: Insufficient documentation

## 2013-04-01 HISTORY — DX: Atherosclerotic heart disease of native coronary artery without angina pectoris: I25.10

## 2013-04-01 LAB — COMPREHENSIVE METABOLIC PANEL
Albumin: 3.7 g/dL (ref 3.5–5.2)
BUN: 14 mg/dL (ref 6–23)
Creatinine, Ser: 0.83 mg/dL (ref 0.50–1.10)
Total Protein: 7.1 g/dL (ref 6.0–8.3)

## 2013-04-01 LAB — CBC WITH DIFFERENTIAL/PLATELET
Basophils Absolute: 0 10*3/uL (ref 0.0–0.1)
Basophils Relative: 1 % (ref 0–1)
Eosinophils Absolute: 0.1 10*3/uL (ref 0.0–0.7)
HCT: 34.5 % — ABNORMAL LOW (ref 36.0–46.0)
Hemoglobin: 11.5 g/dL — ABNORMAL LOW (ref 12.0–15.0)
MCH: 27.4 pg (ref 26.0–34.0)
MCHC: 33.3 g/dL (ref 30.0–36.0)
Monocytes Absolute: 0.7 10*3/uL (ref 0.1–1.0)
Monocytes Relative: 9 % (ref 3–12)
Neutrophils Relative %: 49 % (ref 43–77)
RDW: 12.7 % (ref 11.5–15.5)

## 2013-04-01 LAB — TROPONIN I: Troponin I: <0.3 ng/mL (ref <0.30)

## 2013-04-01 MED ORDER — PROMETHAZINE HCL 25 MG PO TABS: 25.0000 mg | ORAL_TABLET | Freq: Four times a day (QID) | ORAL | Status: DC | PRN

## 2013-04-01 MED ORDER — IBUPROFEN 800 MG PO TABS
800.0000 mg | ORAL_TABLET | Freq: Once | ORAL | Status: AC
  Administered 2013-04-01: 800 mg via ORAL
  Filled 2013-04-01: qty 1

## 2013-04-01 MED ORDER — TRAMADOL HCL 50 MG PO TABS: 50.0000 mg | ORAL_TABLET | Freq: Four times a day (QID) | ORAL | Status: DC | PRN

## 2013-04-01 MED ORDER — ACETAMINOPHEN 500 MG PO TABS
1000.0000 mg | ORAL_TABLET | Freq: Once | ORAL | Status: AC
  Administered 2013-04-01: 1000 mg via ORAL
  Filled 2013-04-01: qty 2

## 2013-04-01 NOTE — ED Notes (Signed)
Also c/o pain in left calf. Denies hx of DVT

## 2013-04-01 NOTE — ED Notes (Signed)
Pt. C/o left chest pain radiating to back and left arm. C/o nausea and lightheadedness.  Hx of CHF and HTN.

## 2013-04-01 NOTE — ED Notes (Signed)
The pt awakened this am with lt sided and lateral chest pain with nausea and dizziness.

## 2013-04-01 NOTE — ED Provider Notes (Signed)
History     CSN: [id number]  Arrival date & time 04/01/13  0411   First MD Initiated Contact with Patient 04/01/13 0434      Chief Complaint  Patient presents with  . Chest Pain    (Consider location/radiation/quality/duration/timing/severity/associated sxs/prior treatment) HPI..vague left sided chest pain with radiation to left arm for one week.. No dyspnea, diaphoresis, nausea.  Nothing makes symptoms better or worse. Severity is mild.  Past Medical History  Diagnosis Date  . Hypertension     Poorly controlled. Has had HTN since age 47. Angioedema with ACEI.  24 Hr urine and renal arterial dopplers ordered . . . Never done  . Diabetes mellitus, type 2     HbA1C  11.1 8/12  . NICM (nonischemic cardiomyopathy)     EF 45-50% in 8/12, cath 6/12 showed normal coronaries, EF 50-55% by LV gram  . Morbid obesity   . Allergic rhinitis   . HLD (hyperlipidemia)     LDL 110.9 in 9/12  . OSA (obstructive sleep apnea)     sleep study in 8/12 showed moderate to severe OSA requiring CPAP  . Chronic lower back pain     secondary to DJD, obseity, hip problems. Sees Dr. Ivory Broad  . 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 Chronic diastolic heart failure      Primarily diastolic CHF: Likely due to uncontrolled HTN. Last echo (8/12) with EF 45-50%, mild to moderate LVH with some asymmetric septal hypertrophy, RV normal size and systolic function. EF 50-55% by LV-gram in 6/12.   . Degeneration of lumbar or lumbosacral intervertebral disc   . Polyneuropathy in diabetes(357.2)   . Thoracic or lumbosacral neuritis or radiculitis, unspecified   . Calcifying tendinitis of shoulder   . LBBB (left bundle branch block)   . Chronic combined systolic and diastolic CHF (congestive heart failure)     EF 40-45% by echo 12/06/2012  . Coronary artery disease     Past Surgical History  Procedure Laterality Date  . Breast biopsies    . Tubal ligation  05/31/1985  . Hand surgery       Left  . Shoulder surgery      right  . Dental surgery      tumor removed   . Cardiac catheterization      Family History  Problem Relation Age of Onset  . Heart disease Mother   . Heart disease Paternal Grandmother   . Heart disease Paternal Grandfather   . Stroke Paternal Grandfather   . Heart disease Father     History  Substance Use Topics  . Smoking status: Never Smoker   . Smokeless tobacco: Never Used  . Alcohol Use: No    OB History   Grav Para Term Preterm Abortions TAB SAB Ect Mult Living                  Review of Systems  All other systems reviewed and are negative.    Allergies  Clindamycin; Enalapril maleate; and Lisinopril  Home Medications   Current Outpatient Rx  Name  Route  Sig  Dispense  Refill  . albuterol (VENTOLIN HFA) 108 (90 BASE) MCG/ACT inhaler   Inhalation   Inhale 2 puffs into the lungs every 6 (six) hours as needed. For wheezing         . aspirin EC 81 MG tablet   Oral   Take 1 tablet (81 mg total) by mouth  daily.         . carbamazepine (TEGRETOL XR) 200 MG 12 hr tablet   Oral   Take 1 tablet (200 mg total) by mouth 2 (two) times daily. Take at night only for 7 days then increase to twice daily   60 tablet   3   . citalopram (CELEXA) 20 MG tablet   Oral   Take 10 mg by mouth daily.         . cloNIDine (CATAPRES) 0.2 MG tablet   Oral   Take 1 tablet (0.2 mg total) by mouth 2 (two) times daily.   60 tablet   6   . diclofenac sodium (VOLTAREN) 1 % GEL   Topical   Apply 2 g topically 4 (four) times daily. May apply 2-4 gm as needed   3 Tube   4   . furosemide (LASIX) 40 MG tablet   Oral   Take 1 tablet (40 mg total) by mouth 2 (two) times daily. If you gain any more than 2-3 pounds in a 24 hour period of time, take an extra dose of lasix.   90 tablet   3   . insulin aspart (NOVOLOG FLEXPEN) 100 UNIT/ML injection   Subcutaneous   Inject 7 Units into the skin 3 (three) times daily before meals.   1 vial    12     Please also dispense pen needles when needed.   . insulin glargine (LANTUS) 100 UNIT/ML injection   Subcutaneous   Inject 0.65 mLs (65 Units total) into the skin daily.   10 mL   0   . methocarbamol (ROBAXIN) 500 MG tablet   Oral   Take 1 tablet (500 mg total) by mouth every 12 (twelve) hours. For muscle spasm   30 tablet   0   . metoprolol succinate (TOPROL-XL) 100 MG 24 hr tablet   Oral   Take 100 mg by mouth daily. Take with or immediately following a meal.         . oxyCODONE-acetaminophen (PERCOCET) 10-325 MG per tablet   Oral   Take 1 tablet by mouth every 8 (eight) hours as needed. For pain   60 tablet   0     Must last 30 days.   . pantoprazole (PROTONIX) 40 MG tablet   Oral   Take 40 mg by mouth daily.          . potassium chloride (K-DUR) 10 MEQ tablet   Oral   Take 1 tablet (10 mEq total) by mouth daily.   90 tablet   3   . spironolactone (ALDACTONE) 25 MG tablet   Oral   Take 1 tablet (25 mg total) by mouth daily.   30 tablet   6     BP 155/111  Pulse 90  Temp(Src) 98.1 F (36.7 C) (Oral)  Resp 18  SpO2 97%  LMP 03/17/2013  Physical Exam  Nursing note and vitals reviewed. Constitutional: She is oriented to person, place, and time.  obese  HENT:  Head: Normocephalic and atraumatic.  Eyes: Conjunctivae and EOM are normal. Pupils are equal, round, and reactive to light.  Neck: Normal range of motion. Neck supple.  Cardiovascular: Normal rate, regular rhythm and normal heart sounds.   Pulmonary/Chest: Effort normal and breath sounds normal.  Abdominal: Soft. Bowel sounds are normal.  Musculoskeletal:  Left calf is nontender  Neurological: She is alert and oriented to person, place, and time.  Skin: Skin is warm and dry.  Psychiatric: She has a normal mood and affect.    ED Course  Procedures (including critical care time)  Labs Reviewed  CBC WITH DIFFERENTIAL - Abnormal; Notable for the following:    Hemoglobin 11.5 (*)     HCT 34.5 (*)    All other components within normal limits  COMPREHENSIVE METABOLIC PANEL  TROPONIN I   Dg Chest Portable 1 View  04/01/2013  *RADIOLOGY REPORT*  Clinical Data: Chest pain and shortness of breath; left shoulder and arm pain.  PORTABLE CHEST - 1 VIEW  Comparison: Chest radiograph and CTA of the chest performed 02/24/2013  Findings: The lungs are well-aerated.  Mild peribronchial thickening is noted.  Minimal bibasilar opacities may reflect atelectasis.  There is no evidence of pleural effusion or pneumothorax.  The cardiomediastinal silhouette is borderline enlarged.  No acute osseous abnormalities are seen.  IMPRESSION: Minimal bibasilar opacities may reflect atelectasis.  Mild peribronchial thickening noted; borderline cardiomegaly.   Original Report Authenticated By: Tonia Ghent, M.D.      No diagnosis found.   Date: 04/01/2013  Rate: 90  Rhythm: normal sinus rhythm  QRS Axis: normal  Intervals: normal  ST/T Wave abnormalities: normal  Conduction Disutrbances: prolonged QT  Narrative Interpretation: unremarkable     MDM  Patient looks well. Screening tests are normal with exception of a mildly elevated glucose. She has primary care followup. Discharge meds Ultram and Phenergan        Donnetta Hutching, MD 04/01/13 [phone]

## 2013-04-02 ENCOUNTER — Encounter: Payer: Self-pay | Admitting: *Deleted

## 2013-04-02 ENCOUNTER — Encounter: Payer: Medicaid Other | Attending: Physical Medicine & Rehabilitation | Admitting: Physical Medicine & Rehabilitation

## 2013-04-02 ENCOUNTER — Encounter: Payer: Self-pay | Admitting: Physical Medicine & Rehabilitation

## 2013-04-02 VITALS — BP 149/90 | HR 101 | Resp 14 | Ht 69.0 in | Wt 286.0 lb

## 2013-04-02 DIAGNOSIS — E1149 Type 2 diabetes mellitus with other diabetic neurological complication: Secondary | ICD-10-CM | POA: Insufficient documentation

## 2013-04-02 DIAGNOSIS — Z5181 Encounter for therapeutic drug level monitoring: Secondary | ICD-10-CM

## 2013-04-02 DIAGNOSIS — M7918 Myalgia, other site: Secondary | ICD-10-CM

## 2013-04-02 DIAGNOSIS — Z79899 Other long term (current) drug therapy: Secondary | ICD-10-CM

## 2013-04-02 DIAGNOSIS — IMO0001 Reserved for inherently not codable concepts without codable children: Secondary | ICD-10-CM

## 2013-04-02 DIAGNOSIS — M1710 Unilateral primary osteoarthritis, unspecified knee: Secondary | ICD-10-CM | POA: Insufficient documentation

## 2013-04-02 DIAGNOSIS — M47817 Spondylosis without myelopathy or radiculopathy, lumbosacral region: Secondary | ICD-10-CM | POA: Insufficient documentation

## 2013-04-02 DIAGNOSIS — E1140 Type 2 diabetes mellitus with diabetic neuropathy, unspecified: Secondary | ICD-10-CM

## 2013-04-02 DIAGNOSIS — E669 Obesity, unspecified: Secondary | ICD-10-CM

## 2013-04-02 DIAGNOSIS — M1712 Unilateral primary osteoarthritis, left knee: Secondary | ICD-10-CM

## 2013-04-02 DIAGNOSIS — I10 Essential (primary) hypertension: Secondary | ICD-10-CM

## 2013-04-02 DIAGNOSIS — M4716 Other spondylosis with myelopathy, lumbar region: Secondary | ICD-10-CM

## 2013-04-02 DIAGNOSIS — E1142 Type 2 diabetes mellitus with diabetic polyneuropathy: Secondary | ICD-10-CM

## 2013-04-02 DIAGNOSIS — M47816 Spondylosis without myelopathy or radiculopathy, lumbar region: Secondary | ICD-10-CM

## 2013-04-02 MED ORDER — CARBAMAZEPINE 200 MG PO TABS: 200.0000 mg | ORAL_TABLET | Freq: Three times a day (TID) | ORAL | Status: DC

## 2013-04-02 MED ORDER — OXYCODONE-ACETAMINOPHEN 10-325 MG PO TABS: 1.0000 | ORAL_TABLET | Freq: Three times a day (TID) | ORAL | Status: DC | PRN

## 2013-04-02 MED ORDER — TRAMADOL HCL 50 MG PO TABS: 50.0000 mg | ORAL_TABLET | Freq: Four times a day (QID) | ORAL | Status: DC | PRN
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
## 2013-04-02 NOTE — Patient Instructions (Signed)
Impingement Syndrome, Rotator Cuff, Bursitis with Rehab Impingement syndrome is a condition that involves inflammation of the tendons of the rotator cuff and the subacromial bursa, that causes pain in the shoulder. The rotator cuff consists of four tendons and muscles that control much of the shoulder and upper arm function. The subacromial bursa is a fluid filled sac that helps reduce friction between the rotator cuff and one of the bones of the shoulder (acromion). Impingement syndrome is usually an overuse injury that causes swelling of the bursa (bursitis), swelling of the tendon (tendonitis), and/or a tear of the tendon (strain). Strains are classified into three categories. Grade 1 strains cause pain, but the tendon is not lengthened. Grade 2 strains include a lengthened ligament, due to the ligament being stretched or partially ruptured. With grade 2 strains there is still function, although the function may be decreased. Grade 3 strains include a complete tear of the tendon or muscle, and function is usually impaired. SYMPTOMS   Pain around the shoulder, often at the outer portion of the upper arm.  Pain that gets worse with shoulder function, especially when reaching overhead or lifting.  Sometimes, aching when not using the arm.  Pain that wakes you up at night.  Sometimes, tenderness, swelling, warmth, or redness over the affected area.  Loss of strength.  Limited motion of the shoulder, especially reaching behind the back (to the back pocket or to unhook bra) or across your body.  Crackling sound (crepitation) when moving the arm.  Biceps tendon pain and inflammation (in the front of the shoulder). Worse when bending the elbow or lifting. CAUSES  Impingement syndrome is often an overuse injury, in which chronic (repetitive) motions cause the tendons or bursa to become inflamed. A strain occurs when a force is paced on the tendon or muscle that is greater than it can withstand.  Common mechanisms of injury include: Stress from sudden increase in duration, frequency, or intensity of training.  Direct hit (trauma) to the shoulder.  Aging, erosion of the tendon with normal use.  Bony bump on shoulder (acromial spur). RISK INCREASES WITH:  Contact sports (football, wrestling, boxing).  Throwing sports (baseball, tennis, volleyball).  Weightlifting and bodybuilding.  Heavy labor.  Previous injury to the rotator cuff, including impingement.  Poor shoulder strength and flexibility.  Failure to warm up properly before activity.  Inadequate protective equipment.  Old age.  Bony bump on shoulder (acromial spur). PREVENTION   Warm up and stretch properly before activity.  Allow for adequate recovery between workouts.  Maintain physical fitness:  Strength, flexibility, and endurance.  Cardiovascular fitness.  Learn and use proper exercise technique. PROGNOSIS  If treated properly, impingement syndrome usually goes away within 6 weeks. Sometimes surgery is required.  RELATED COMPLICATIONS   Longer healing time if not properly treated, or if not given enough time to heal.  Recurring symptoms, that result in a chronic condition.  Shoulder stiffness, frozen shoulder, or loss of motion.  Rotator cuff tendon tear.  Recurring symptoms, especially if activity is resumed too soon, with overuse, with a direct blow, or when using poor technique. TREATMENT  Treatment first involves the use of ice and medicine, to reduce pain and inflammation. The use of strengthening and stretching exercises may help reduce pain with activity. These exercises may be performed at home or with a therapist. If non-surgical treatment is unsuccessful after more than 6 months, surgery may be advised. After surgery and rehabilitation, activity is usually possible in 3 months.    MEDICATION  If pain medicine is needed, nonsteroidal anti-inflammatory medicines (aspirin and  ibuprofen), or other minor pain relievers (acetaminophen), are often advised.  Do not take pain medicine for 7 days before surgery.  Prescription pain relievers may be given, if your caregiver thinks they are needed. Use only as directed and only as much as you need.  Corticosteroid injections may be given by your caregiver. These injections should be reserved for the most serious cases, because they may only be given a certain number of times. HEAT AND COLD  Cold treatment (icing) should be applied for 10 to 15 minutes every 2 to 3 hours for inflammation and pain, and immediately after activity that aggravates your symptoms. Use ice packs or an ice massage.  Heat treatment may be used before performing stretching and strengthening activities prescribed by your caregiver, physical therapist, or athletic trainer. Use a heat pack or a warm water soak. SEEK MEDICAL CARE IF:   Symptoms get worse or do not improve in 4 to 6 weeks, despite treatment.  New, unexplained symptoms develop. (Drugs used in treatment may produce side effects.) EXERCISES  RANGE OF MOTION (ROM) AND STRETCHING EXERCISES - Impingement Syndrome (Rotator Cuff  Tendinitis, Bursitis) These exercises may help you when beginning to rehabilitate your injury. Your symptoms may go away with or without further involvement from your physician, physical therapist or athletic trainer. While completing these exercises, remember:   Restoring tissue flexibility helps normal motion to return to the joints. This allows healthier, less painful movement and activity.  An effective stretch should be held for at least 30 seconds.  A stretch should never be painful. You should only feel a gentle lengthening or release in the stretched tissue. STRETCH  Flexion, Standing  Stand with good posture. With an underhand grip on your right / left hand, and an overhand grip on the opposite hand, grasp a broomstick or cane so that your hands are a little  more than shoulder width apart.  Keeping your right / left elbow straight and shoulder muscles relaxed, push the stick with your opposite hand, to raise your right / left arm in front of your body and then overhead. Raise your arm until you feel a stretch in your right / left shoulder, but before you have increased shoulder pain.  Try to avoid shrugging your right / left shoulder as your arm rises, by keeping your shoulder blade tucked down and toward your mid-back spine. Hold for __________ seconds.  Slowly return to the starting position. Repeat __________ times. Complete this exercise __________ times per day. STRETCH  Abduction, Supine  Lie on your back. With an underhand grip on your right / left hand and an overhand grip on the opposite hand, grasp a broomstick or cane so that your hands are a little more than shoulder width apart.  Keeping your right / left elbow straight and your shoulder muscles relaxed, push the stick with your opposite hand, to raise your right / left arm out to the side of your body and then overhead. Raise your arm until you feel a stretch in your right / left shoulder, but before you have increased shoulder pain.  Try to avoid shrugging your right / left shoulder as your arm rises, by keeping your shoulder blade tucked down and toward your mid-back spine. Hold for __________ seconds.  Slowly return to the starting position. Repeat __________ times. Complete this exercise __________ times per day. ROM  Flexion, Active-Assisted  Lie on your back.   You may bend your knees for comfort.  Grasp a broomstick or cane so your hands are about shoulder width apart. Your right / left hand should grip the end of the stick, so that your hand is positioned "thumbs-up," as if you were about to shake hands.  Using your healthy arm to lead, raise your right / left arm overhead, until you feel a gentle stretch in your shoulder. Hold for __________ seconds.  Use the stick to  assist in returning your right / left arm to its starting position. Repeat __________ times. Complete this exercise __________ times per day.  ROM - Internal Rotation, Supine   Lie on your back on a firm surface. Place your right / left elbow about 60 degrees away from your side. Elevate your elbow with a folded towel, so that the elbow and shoulder are the same height.  Using a broomstick or cane and your strong arm, pull your right / left hand toward your body until you feel a gentle stretch, but no increase in your shoulder pain. Keep your shoulder and elbow in place throughout the exercise.  Hold for __________ seconds. Slowly return to the starting position. Repeat __________ times. Complete this exercise __________ times per day. STRETCH - Internal Rotation  Place your right / left hand behind your back, palm up.  Throw a towel or belt over your opposite shoulder. Grasp the towel with your right / left hand.  While keeping an upright posture, gently pull up on the towel, until you feel a stretch in the front of your right / left shoulder.  Avoid shrugging your right / left shoulder as your arm rises, by keeping your shoulder blade tucked down and toward your mid-back spine.  Hold for __________ seconds. Release the stretch, by lowering your healthy hand. Repeat __________ times. Complete this exercise __________ times per day. ROM - Internal Rotation   Using an underhand grip, grasp a stick behind your back with both hands.  While standing upright with good posture, slide the stick up your back until you feel a mild stretch in the front of your shoulder.  Hold for __________ seconds. Slowly return to your starting position. Repeat __________ times. Complete this exercise __________ times per day.  STRETCH  Posterior Shoulder Capsule   Stand or sit with good posture. Grasp your right / left elbow and draw it across your chest, keeping it at the same height as your  shoulder.  Pull your elbow, so your upper arm comes in closer to your chest. Pull until you feel a gentle stretch in the back of your shoulder.  Hold for __________ seconds. Repeat __________ times. Complete this exercise __________ times per day. STRENGTHENING EXERCISES - Impingement Syndrome (Rotator Cuff Tendinitis, Bursitis) These exercises may help you when beginning to rehabilitate your injury. They may resolve your symptoms with or without further involvement from your physician, physical therapist or athletic trainer. While completing these exercises, remember:  Muscles can gain both the endurance and the strength needed for everyday activities through controlled exercises.  Complete these exercises as instructed by your physician, physical therapist or athletic trainer. Increase the resistance and repetitions only as guided.  You may experience muscle soreness or fatigue, but the pain or discomfort you are trying to eliminate should never worsen during these exercises. If this pain does get worse, stop and make sure you are following the directions exactly. If the pain is still present after adjustments, discontinue the exercise until you can discuss   the trouble with your clinician.  During your recovery, avoid activity or exercises which involve actions that place your injured hand or elbow above your head or behind your back or head. These positions stress the tissues which you are trying to heal. STRENGTH - Scapular Depression and Adduction   With good posture, sit on a firm chair. Support your arms in front of you, with pillows, arm rests, or on a table top. Have your elbows in line with the sides of your body.  Gently draw your shoulder blades down and toward your mid-back spine. Gradually increase the tension, without tensing the muscles along the top of your shoulders and the back of your neck.  Hold for __________ seconds. Slowly release the tension and relax your muscles  completely before starting the next repetition.  After you have practiced this exercise, remove the arm support and complete the exercise in standing as well as sitting position. Repeat __________ times. Complete this exercise __________ times per day.  STRENGTH - Shoulder Abductors, Isometric  With good posture, stand or sit about 4-6 inches from a wall, with your right / left side facing the wall.  Bend your right / left elbow. Gently press your right / left elbow into the wall. Increase the pressure gradually, until you are pressing as hard as you can, without shrugging your shoulder or increasing any shoulder discomfort.  Hold for __________ seconds.  Release the tension slowly. Relax your shoulder muscles completely before you begin the next repetition. Repeat __________ times. Complete this exercise __________ times per day.  STRENGTH - External Rotators, Isometric  Keep your right / left elbow at your side and bend it 90 degrees.  Step into a door frame so that the outside of your right / left wrist can press against the door frame without your upper arm leaving your side.  Gently press your right / left wrist into the door frame, as if you were trying to swing the back of your hand away from your stomach. Gradually increase the tension, until you are pressing as hard as you can, without shrugging your shoulder or increasing any shoulder discomfort.  Hold for __________ seconds.  Release the tension slowly. Relax your shoulder muscles completely before you begin the next repetition. Repeat __________ times. Complete this exercise __________ times per day.  STRENGTH - Supraspinatus   Stand or sit with good posture. Grasp a __________ weight, or an exercise band or tubing, so that your hand is "thumbs-up," like you are shaking hands.  Slowly lift your right / left arm in a "V" away from your thigh, diagonally into the space between your side and straight ahead. Lift your hand to  shoulder height or as far as you can, without increasing any shoulder pain. At first, many people do not lift their hands above shoulder height.  Avoid shrugging your right / left shoulder as your arm rises, by keeping your shoulder blade tucked down and toward your mid-back spine.  Hold for __________ seconds. Control the descent of your hand, as you slowly return to your starting position. Repeat __________ times. Complete this exercise __________ times per day.  STRENGTH - External Rotators  Secure a rubber exercise band or tubing to a fixed object (table, pole) so that it is at the same height as your right / left elbow when you are standing or sitting on a firm surface.  Stand or sit so that the secured exercise band is at your uninjured side.  Bend   your right / left elbow 90 degrees. Place a folded towel or small pillow under your right / left arm, so that your elbow is a few inches away from your side.  Keeping the tension on the exercise band, pull it away from your body, as if pivoting on your elbow. Be sure to keep your body steady, so that the movement is coming only from your rotating shoulder.  Hold for __________ seconds. Release the tension in a controlled manner, as you return to the starting position. Repeat __________ times. Complete this exercise __________ times per day.  STRENGTH - Internal Rotators   Secure a rubber exercise band or tubing to a fixed object (table, pole) so that it is at the same height as your right / left elbow when you are standing or sitting on a firm surface.  Stand or sit so that the secured exercise band is at your right / left side.  Bend your elbow 90 degrees. Place a folded towel or small pillow under your right / left arm so that your elbow is a few inches away from your side.  Keeping the tension on the exercise band, pull it across your body, toward your stomach. Be sure to keep your body steady, so that the movement is coming only from  your rotating shoulder.  Hold for __________ seconds. Release the tension in a controlled manner, as you return to the starting position. Repeat __________ times. Complete this exercise __________ times per day.  STRENGTH - Scapular Protractors, Standing   Stand arms length away from a wall. Place your hands on the wall, keeping your elbows straight.  Begin by dropping your shoulder blades down and toward your mid-back spine.  To strengthen your protractors, keep your shoulder blades down, but slide them forward on your rib cage. It will feel as if you are lifting the back of your rib cage away from the wall. This is a subtle motion and can be challenging to complete. Ask your caregiver for further instruction, if you are not sure you are doing the exercise correctly.  Hold for __________ seconds. Slowly return to the starting position, resting the muscles completely before starting the next repetition. Repeat __________ times. Complete this exercise __________ times per day. STRENGTH - Scapular Protractors, Supine  Lie on your back on a firm surface. Extend your right / left arm straight into the air while holding a __________ weight in your hand.  Keeping your head and back in place, lift your shoulder off the floor.  Hold for __________ seconds. Slowly return to the starting position, and allow your muscles to relax completely before starting the next repetition. Repeat __________ times. Complete this exercise __________ times per day. STRENGTH - Scapular Protractors, Quadruped  Get onto your hands and knees, with your shoulders directly over your hands (or as close as you can be, comfortably).  Keeping your elbows locked, lift the back of your rib cage up into your shoulder blades, so your mid-back rounds out. Keep your neck muscles relaxed.  Hold this position for __________ seconds. Slowly return to the starting position and allow your muscles to relax completely before starting the  next repetition. Repeat __________ times. Complete this exercise __________ times per day.  STRENGTH - Scapular Retractors  Secure a rubber exercise band or tubing to a fixed object (table, pole), so that it is at the height of your shoulders when you are either standing, or sitting on a firm armless chair.  With a   palm down grip, grasp an end of the band in each hand. Straighten your elbows and lift your hands straight in front of you, at shoulder height. Step back, away from the secured end of the band, until it becomes tense.  Squeezing your shoulder blades together, draw your elbows back toward your sides, as you bend them. Keep your upper arms lifted away from your body throughout the exercise.  Hold for __________ seconds. Slowly ease the tension on the band, as you reverse the directions and return to the starting position. Repeat __________ times. Complete this exercise __________ times per day. STRENGTH - Shoulder Extensors   Secure a rubber exercise band or tubing to a fixed object (table, pole) so that it is at the height of your shoulders when you are either standing, or sitting on a firm armless chair.  With a thumbs-up grip, grasp an end of the band in each hand. Straighten your elbows and lift your hands straight in front of you, at shoulder height. Step back, away from the secured end of the band, until it becomes tense.  Squeezing your shoulder blades together, pull your hands down to the sides of your thighs. Do not allow your hands to go behind you.  Hold for __________ seconds. Slowly ease the tension on the band, as you reverse the directions and return to the starting position. Repeat __________ times. Complete this exercise __________ times per day.  STRENGTH - Scapular Retractors and External Rotators   Secure a rubber exercise band or tubing to a fixed object (table, pole) so that it is at the height as your shoulders, when you are either standing, or sitting on a  firm armless chair.  With a palm down grip, grasp an end of the band in each hand. Bend your elbows 90 degrees and lift your elbows to shoulder height, at your sides. Step back, away from the secured end of the band, until it becomes tense.  Squeezing your shoulder blades together, rotate your shoulders so that your upper arms and elbows remain stationary, but your fists travel upward to head height.  Hold for __________ seconds. Slowly ease the tension on the band, as you reverse the directions and return to the starting position. Repeat __________ times. Complete this exercise __________ times per day.  STRENGTH - Scapular Retractors and External Rotators, Rowing   Secure a rubber exercise band or tubing to a fixed object (table, pole) so that it is at the height of your shoulders, when you are either standing, or sitting on a firm armless chair.  With a palm down grip, grasp an end of the band in each hand. Straighten your elbows and lift your hands straight in front of you, at shoulder height. Step back, away from the secured end of the band, until it becomes tense.  Step 1: Squeeze your shoulder blades together. Bending your elbows, draw your hands to your chest, as if you are rowing a boat. At the end of this motion, your hands and elbow should be at shoulder height and your elbows should be out to your sides.  Step 2: Rotate your shoulders, to raise your hands above your head. Your forearms should be vertical and your upper arms should be horizontal.  Hold for __________ seconds. Slowly ease the tension on the band, as you reverse the directions and return to the starting position. Repeat __________ times. Complete this exercise __________ times per day.  STRENGTH  Scapular Depressors  Find a sturdy chair   without wheels, such as a dining room chair.  Keeping your feet on the floor, and your hands on the chair arms, lift your bottom up from the seat, and lock your elbows.  Keeping  your elbows straight, allow gravity to pull your body weight down. Your shoulders will rise toward your ears.  Raise your body against gravity by drawing your shoulder blades down your back, shortening the distance between your shoulders and ears. Although your feet should always maintain contact with the floor, your feet should progressively support less body weight, as you get stronger.  Hold for __________ seconds. In a controlled and slow manner, lower your body weight to begin the next repetition. Repeat __________ times. Complete this exercise __________ times per day.  Document Released: 11/29/2005 Document Revised: 02/21/2012 Document Reviewed: 03/13/2009 [employer] Patient Information 2013 [employer]  

## 2013-04-04 ENCOUNTER — Telehealth: Payer: Self-pay

## 2013-04-04 DIAGNOSIS — R0602 Shortness of breath: Secondary | ICD-10-CM

## 2013-04-04 DIAGNOSIS — I5043 Acute on chronic combined systolic (congestive) and diastolic (congestive) heart failure: Secondary | ICD-10-CM

## 2013-04-04 DIAGNOSIS — I5032 Chronic diastolic (congestive) heart failure: Secondary | ICD-10-CM

## 2013-04-04 DIAGNOSIS — G4733 Obstructive sleep apnea (adult) (pediatric): Secondary | ICD-10-CM

## 2013-04-04 NOTE — Telephone Encounter (Signed)
Patient called to get an order for a bedside commode and a walker.  She also needs her medication refilled at [facility].  Patient could not tell me the medications needed.  Please advise about other request.

## 2013-04-06 NOTE — Telephone Encounter (Signed)
done

## 2013-04-09 ENCOUNTER — Ambulatory Visit (INDEPENDENT_AMBULATORY_CARE_PROVIDER_SITE_OTHER): Payer: Medicaid Other | Admitting: Internal Medicine

## 2013-04-09 ENCOUNTER — Encounter: Payer: Self-pay | Admitting: Internal Medicine

## 2013-04-09 ENCOUNTER — Ambulatory Visit: Payer: Medicaid Other | Admitting: Dietician

## 2013-04-09 VITALS — BP 170/110 | HR 111 | Temp 97.2°F | Ht 69.0 in | Wt 288.5 lb

## 2013-04-09 DIAGNOSIS — I5042 Chronic combined systolic (congestive) and diastolic (congestive) heart failure: Secondary | ICD-10-CM

## 2013-04-09 DIAGNOSIS — IMO0002 Reserved for concepts with insufficient information to code with codable children: Secondary | ICD-10-CM

## 2013-04-09 DIAGNOSIS — E119 Type 2 diabetes mellitus without complications: Secondary | ICD-10-CM

## 2013-04-09 DIAGNOSIS — I5032 Chronic diastolic (congestive) heart failure: Secondary | ICD-10-CM

## 2013-04-09 DIAGNOSIS — E1165 Type 2 diabetes mellitus with hyperglycemia: Secondary | ICD-10-CM

## 2013-04-09 DIAGNOSIS — E1142 Type 2 diabetes mellitus with diabetic polyneuropathy: Secondary | ICD-10-CM

## 2013-04-09 DIAGNOSIS — F32A Depression, unspecified: Secondary | ICD-10-CM | POA: Insufficient documentation

## 2013-04-09 DIAGNOSIS — E1140 Type 2 diabetes mellitus with diabetic neuropathy, unspecified: Secondary | ICD-10-CM

## 2013-04-09 DIAGNOSIS — I10 Essential (primary) hypertension: Secondary | ICD-10-CM

## 2013-04-09 DIAGNOSIS — I509 Heart failure, unspecified: Secondary | ICD-10-CM

## 2013-04-09 DIAGNOSIS — F329 Major depressive disorder, single episode, unspecified: Secondary | ICD-10-CM

## 2013-04-09 DIAGNOSIS — K219 Gastro-esophageal reflux disease without esophagitis: Secondary | ICD-10-CM

## 2013-04-09 DIAGNOSIS — R300 Dysuria: Secondary | ICD-10-CM | POA: Insufficient documentation

## 2013-04-09 DIAGNOSIS — E1149 Type 2 diabetes mellitus with other diabetic neurological complication: Secondary | ICD-10-CM

## 2013-04-09 LAB — GLUCOSE, CAPILLARY: Glucose-Capillary: 224 mg/dL — ABNORMAL HIGH (ref 70–99)

## 2013-04-09 LAB — PRO B NATRIURETIC PEPTIDE: Pro B Natriuretic peptide (BNP): 516.7 pg/mL — ABNORMAL HIGH (ref <126)

## 2013-04-09 MED ORDER — SPIRONOLACTONE 25 MG PO TABS: 25.0000 mg | ORAL_TABLET | Freq: Every day | ORAL | Status: DC

## 2013-04-09 MED ORDER — INSULIN GLARGINE 100 UNIT/ML ~~LOC~~ SOLN: 20.0000 [IU] | Freq: Every day | SUBCUTANEOUS | Status: DC

## 2013-04-09 MED ORDER — GLUCOSE BLOOD VI STRP: ORAL_STRIP | Status: DC

## 2013-04-09 MED ORDER — PANTOPRAZOLE SODIUM 40 MG PO TBEC: 40.0000 mg | DELAYED_RELEASE_TABLET | Freq: Every day | ORAL | Status: DC

## 2013-04-09 MED ORDER — CLONIDINE HCL 0.2 MG PO TABS: 0.2000 mg | ORAL_TABLET | Freq: Two times a day (BID) | ORAL | Status: DC

## 2013-04-09 MED ORDER — FUROSEMIDE 40 MG PO TABS: 40.0000 mg | ORAL_TABLET | Freq: Two times a day (BID) | ORAL | Status: DC

## 2013-04-09 MED ORDER — ACCU-CHEK AVIVA PLUS W/DEVICE KIT: 1.0000 | PACK | Freq: Once | Status: DC

## 2013-04-09 MED ORDER — INSULIN ASPART 100 UNIT/ML ~~LOC~~ SOLN: 7.0000 [IU] | Freq: Three times a day (TID) | SUBCUTANEOUS | Status: DC

## 2013-04-09 MED ORDER — METOPROLOL SUCCINATE ER 100 MG PO TB24: 100.0000 mg | ORAL_TABLET | Freq: Every day | ORAL | Status: DC

## 2013-04-09 MED ORDER — POLYETHYLENE GLYCOL 3350 17 G PO PACK: 17.0000 g | PACK | Freq: Every day | ORAL | Status: DC

## 2013-04-09 MED ORDER — ACCU-CHEK SOFTCLIX LANCET DEV MISC: Status: DC

## 2013-04-09 MED ORDER — CITALOPRAM HYDROBROMIDE 20 MG PO TABS: 20.0000 mg | ORAL_TABLET | Freq: Every day | ORAL | Status: DC

## 2013-04-09 MED ORDER — INSULIN PEN NEEDLE 30G X 8 MM MISC: 1.0000 | Status: DC | PRN

## 2013-04-09 MED ORDER — POTASSIUM CHLORIDE ER 10 MEQ PO TBCR: 10.0000 meq | EXTENDED_RELEASE_TABLET | Freq: Every day | ORAL | Status: DC

## 2013-04-09 NOTE — Addendum Note (Signed)
Addended by: Bufford Spikes on: 04/09/2013 01:27 PM   Modules accepted: Orders

## 2013-04-09 NOTE — Assessment & Plan Note (Addendum)
Pertinent Data Reviewed: Vitals - 1 value per visit 04/09/2013 04/02/2013 04/01/2013 02/27/2013  SYSTOLIC 170 149 098 116  DIASTOLIC 110 90 91 70  PULSE 111 101 90 85  TEMPERATURE 97.2  98.1 98  RESPIRATIONS  14 14 17   Weight (lb) 288.5 286  277.12    2D echocardiogram (11/2012) - Left ventricle: Technically very limited study. Dysynergy of the septum. Hypokinesis of the inferior wall. The cavity size was normal. Wall thickness was increased in a pattern of moderate LVH. Systolic function was mildly to moderately reduced. The estimated ejection fraction was in the range of 40% to 45%. Doppler parameters are consistent with high ventricular filling pressure. Prepared and Electronically Authenticated by: Willa Rough  Left Heart Catheterization (05/2011) - 1. Normal coronary arteries. 2. Normal left ventricular function. 3. Severe hypertension.. ORIGINAL REPORT BY: Arvilla Meres   Assessment: The patient has known history of combined systolic and diastolic CHF, in the setting of nonischemic cardiomyopathy. This is partially contributed by her severe uncontrolled hypertension in the setting of medication noncompliance and possible secondarily associated with OSA that is untreated. The patient does confirm chronic stable orthopnea, PND. Recent worsening lower extremity edema. She has been out of her Spironolactone and Toprol XL x 2 months and BP remains significantly elevated - which is likely contributing towards gradual fluid accumulation, which is evidenced on exam today.  I think diet control and adherence to medications with significant improve her fluid status. She therefore will be resumed of her home medications. She is advised to continue to check weights daily and inform clinic if gaining > 3lbs in 2 days.  Plan:      Resume metoprolol XL and spironolactone  Continue Lasix and clonidine  Check weights daily  Handout regarding heart failure was given  Advised to resume followup  with [facility] for this issue, as she has been doing historically.

## 2013-04-09 NOTE — Assessment & Plan Note (Addendum)
Pertinent Data Reviewed: Urine dipstick (04/09/2013) - negative for nitrites, positive for leukocytes, glucose, protein.  Assessment: Pt complaining of dysuria. Will check full UA.  Plan:      Check UA and urine culture if appropriate.  ADDENDUM TO PLAN AFTER LABS RESULTED: 04/10/2013, 10:38 AM  Pertinent Data Reviewed: Urinalysis    Component Value Date/Time   COLORURINE YELLOW 04/09/2013 1152   APPEARANCEUR CLEAR 04/09/2013 1152   LABSPEC 1.027 04/09/2013 1152   PHURINE 5.5 04/09/2013 1152   GLUCOSEU > 1000* 04/09/2013 1152   HGBUR NEG 04/09/2013 1152   BILIRUBINUR NEG 04/09/2013 1152   KETONESUR NEG 04/09/2013 1152   PROTEINUR NEG 04/09/2013 1152   UROBILINOGEN 0.2 04/09/2013 1152   NITRITE NEG 04/09/2013 1152   LEUKOCYTESUR TRACE* 04/09/2013 1152    Assessment: Patient symptomatic and with 11-20 WBC in urine and positive leukocyte esterase. Will therefore, tx for uncomplicated UTI.  Plan:  Patient called and informed of the above.  Will start ciprofloxacin 250mg  BID x 3 days, to be sent to her [facility] pharmacy.   Patient verbally expresses understanding of the above and to stop the medication immediately if any s/s of allergic reaction or intolerance to medication.

## 2013-04-09 NOTE — Patient Instructions (Addendum)
General Instructions:  Please follow-up at the clinic in 2 weeks, at which time we will reevaluate your diabetes and blood pressure - OR, please follow-up in the clinic sooner if needed.  There have been changes in your medications:  DECREASE your Lantus to 20 units each day at bedtime.  START miralax as needed for constipation.  START checking your blood sugars 3 times daily before meals.  Your meter and prescriptions for your diabetic supplies have been sent to [facility]  We will fax your other prescriptions to [facility]    If you have been started on new medication(s), and you develop symptoms concerning for allergic reaction, including, but not limited to, throat closing, tongue swelling, rash, please stop the medication immediately and call the clinic at [phone], and go to the ER.  If you are diabetic, please bring your meter to your next visit.  If symptoms worsen, or new symptoms arise, please call the clinic or go to the ER.  PLEASE BRING ALL OF YOUR MEDICATIONS  IN A BAG TO YOUR NEXT APPOINTMENT    Heart Failure Heart failure (HF) is a condition in which the heart has trouble pumping blood. This means your heart does not pump blood efficiently for your body to work well. In some cases of HF, fluid may back up into your lungs or you may have swelling (edema) in your lower legs. HF is a long-term (chronic) condition. It is important for you to take good care of yourself and follow your caregiver's treatment plan. CAUSES   Health conditions:  High blood pressure (hypertension) causes the heart muscle to work harder than normal. When pressure in the blood vessels is high, the heart needs to pump (contract) with more force in order to circulate blood throughout the body. High blood pressure eventually causes the heart to become stiff and weak.  Coronary artery disease (CAD) is the buildup of cholesterol and fat (plaques) in the arteries of the heart. The blockage in the  arteries deprives the heart muscle of oxygen and blood. This can cause chest pain and may lead to a heart attack. High blood pressure can also contribute to CAD.  Heart attack (myocardial infarction) occurs when 1 or more arteries in the heart become blocked. The loss of oxygen damages the muscle tissue of the heart. When this happens, part of the heart muscle dies. The injured tissue does not contract as well and weakens the heart's ability to pump blood.  Abnormal heart valves can cause HF when the heart valves do not open and close properly. This makes the heart muscle pump harder to keep the blood flowing.  Heart muscle disease (cardiomyopathy or myocarditis) is damage to the heart muscle from a variety of causes. These can include drug or alcohol abuse, infections, or unknown reasons. These can increase the risk of HF.  Lung disease makes the heart work harder because the lungs do not work properly. This can cause a strain on the heart leading it to fail.  Diabetes increases the risk of HF. High blood sugar contributes to high fat (lipid) levels in the blood. Diabetes can also cause slow damage to tiny blood vessels that carry important nutrients to the heart muscle. When the heart does not get enough oxygen and food, it can cause the heart to become weak and stiff. This leads to a heart that does not contract efficiently.  Other diseases can contribute to HF. These include abnormal heart rhythms, thyroid problems, and low blood counts (anemia).  Unhealthy lifestyle habits:  Obesity.  Smoking.  Eating foods high in fat and cholesterol.  Eating or drinking beverages high in salt.  Drug or alcohol abuse.  Lack of exercise. SYMPTOMS  HF symptoms may vary and can be hard to detect. Symptoms may include:  Shortness of breath with activity, such as climbing stairs.  Persistent cough.  Swelling of the feet, ankles, legs, or abdomen.  Unexplained weight gain.  Difficulty breathing  when lying flat.  Waking from sleep because of the need to sit up and get more air.  Rapid heartbeat.  Fatigue and loss of energy.  Feeling lightheaded or close to fainting. DIAGNOSIS  A diagnosis of HF is based on your history, symptoms, physical examination, and diagnostic tests. Diagnostic tests for HF may include:  EKG.  Chest X-ray.  Blood tests.  Exercise stress test.  Blood oxygen test (arterial blood gas).  Evaluation by a heart doctor (cardiologist).  Ultrasound evaluation of the heart (echocardiogram).  Heart artery test to look for blockages (angiogram).  Radioactive imaging to look at the heart (radionuclide test). TREATMENT  Treatment is aimed at managing the symptoms of HF. Medicines, lifestyle changes, or surgical intervention may be necessary to treat HF.  Medicines to help treat HF may include:  Angiotensin-converting enzyme (ACE) inhibitors. These block the effects of a blood protein called angiotensin-converting enzyme. ACE inhibitors relax (dilate) the blood vessels and help lower blood pressure. This decreases the workload of the heart, slows the progression of HF, and improves symptoms.  Angiotensin receptor blockers (ARBs). These medications work similar to ACE inhibitors. ARBs may be an alternative for people who cannot tolerate an ACE inhibitor.  Aldosterone antagonists. This medication helps get rid of extra fluid from your body. This lowers the volume of blood the heart has to pump.  Water pills (diuretics). Diuretics cause the kidneys to remove salt and water from the blood. The extra fluid is removed by urination. By removing extra fluid from the body, diuretics help lower the workload of the heart and help prevent fluid buildup in the lungs so breathing is easier.  Beta blockers. These prevent the heart from beating too fast and improve heart muscle strength. Beta blockers help maintain a normal heart rate, control blood pressure, and improve  HF symptoms.  Digitalis. This increases the force of the heartbeat and may be helpful to people with HF or heart rhythm problems.  Healthy lifestyle changes include:  Stopping smoking.  Eating a healthy diet. Avoid foods high in fat. Avoid foods fried in oil or made with fat. A dietician can help with healthy food choices.  Limiting how much salt you eat.  Limiting alcohol intake to no more than 1 drink per day for women and 2 drinks per day for men. Drinking more than that is harmful to your heart. If your heart has already been damaged by alcohol or you have severe HF, drinking alcohol should be stopped completely.  Exercising as directed by your caregiver.  Surgical treatment for HF may include:  Procedures to open blocked arteries, repair damaged heart valves, or remove damaged heart muscle tissue.  A pacemaker to help heart muscle function and to control certain abnormal heart rhythms.  A defibrillator to possibly prevent sudden cardiac death. HOME CARE INSTRUCTIONS   Activity level. Your caregiver can help you determine what type of exercise program may be helpful. It is important to maintain your strength. Pace your physical activity to avoid shortness of breath or chest pain. Rest  for 1 hour before and after meals. A cardiac rehabilitation program may be helpful to some people with HF.  Diet. Eat a heart healthy diet. Food choices should be low in saturated fat and cholesterol. Talk to a dietician to learn about heart healthy foods.  Salt intake. When you have HF, you need to limit the amount of salt you eat. Eat less than 1500 milligrams (mg) of salt per day or as recommended by your caregiver.  Weight monitoring. Weigh yourself every day. You should weigh yourself in the morning after you urinate and before you eat breakfast. Wear the same amount of clothing each time you weigh yourself. Record your weight daily. Bring your recorded weights to your clinic visits. Tell your  caregiver right away if you have gained 3 lb/1.4 kg in 1 day, or 5 lb/2.3 kg in a week or whatever amount you were told to report.  Blood pressure monitoring. This should be done as directed by your caregiver. A home blood pressure cuff can be purchased at a drugstore. Record your blood pressure numbers and bring them to your clinic visits. Tell your caregiver if you become dizzy or lightheaded upon standing up.  Smoking. If you are currently a smoker, it is time to quit. Nicotine makes your heart work harder by causing your blood vessels to constrict. Do not use nicotine gum or patches before talking to your caregiver.  Follow up. Be sure to schedule a follow-up visit with your caregiver. Keep all your appointments. SEEK MEDICAL CARE IF:   Your weight increases by 3 lb/1.4 kg in 1 day or 5 lb/2.3 kg in a week.  You notice increasing shortness of breath that is unusual for you. This may happen during rest, sleep, or with activity.  You cough more than normal, especially with physical activity.  You notice more swelling in your hands, feet, ankles, or belly (abdomen).  You are unable to sleep because it is hard to breathe.  You cough up bloody mucus (sputum).  You begin to feel "jumping" or "fluttering" sensations (palpitations) in your chest. SEEK IMMEDIATE MEDICAL CARE IF:   You have severe chest pain or pressure which may include symptoms such as:  Pain or pressure in the arms, neck, jaw, or back.  Feeling sweaty.  Feeling sick to your stomach (nauseous).  Feeling short of breath while at rest.  Having a fast or irregular heartbeat.  You experience stroke symptoms. These symptoms include:  Facial weakness or numbness.  Weakness or numbness in an arm, leg, or on one side of your body.  Blurred vision.  Difficulty talking or thinking.  Dizziness or fainting.  Severe headache. MAKE SURE YOU:   Understand these instructions.  Will watch your condition.  Will get  help right away if you are not doing well or get worse. Document Released: 11/29/2005 Document Revised: 05/30/2012 Document Reviewed: 03/13/2010 [facility] Patient Information 2013 [place], [place].

## 2013-04-09 NOTE — Assessment & Plan Note (Signed)
Assessment:  Pt is currently supposed to be taking Celexa 20mg  daily and was previously controlled on this medication - but has been out x 1 month with mood worsening over that time frame. Denies SI/HI.   Plan:  Restart Celexa.

## 2013-04-09 NOTE — Progress Notes (Signed)
Patient: [name]   MRN: [id number]  DOB: 02/03/1966  PCP: Caitlyn Gula, MD   Subjective:    CC: Urinary Tract Infection, Diabetes, Medication Refill, Back Pain and Insomnia   HPI: [name] is a 47 y.o. female with a PMHx of as outlined below, who presented to clinic today to establish care with a new PCP and for the following:  1) DM2, uncontrolled -  Lab Results  Component Value Date   HGBA1C 11.4* 12/07/2012   Patient is not checking blood sugars x 1 month because her meter is malfunctioning. Currently taking Novolog 7 units with each meal - and is supposed to be on Lantus 65 units qHS (however, has not started this since being discharged from the hospital). Patient misses doses 1-2 x per week on average. Admits to hospitalization in 02/2013 for hyperglycemia. Denies assisted hypoglycemia. admits to polyuria, polydipsia, dysuria, nausea, Denies vomiting, diarrhea.  does request refills today.  2) Urinary complaint - Patient describes a 2-3 week history of dysuria and malodorous urine, vaginal itching. Otherwise, the patient denies chills and vomiting, fevers.  3) HTN - Patient does not check blood pressure regularly at home. Currently supposed to be taking clonidine 0.2mg  BID, Lasix 40mg  BID, Toprol XL 100mg , and Spironolactone 25mg  daily. Has been out of Toprol XL, Spironolactone x 2 months. admits to lightheadedness, shortness of breath. Denies vision changes, headaches, dizziness. does request refills today.   4) Chronic mixed systolic and diastolic heart failure - last 2-D echo (11/2012) - LV EF 40-45 %). Currently, the patient admits to shortness of breath, orthopnea (chronic 3-4 pillow orthopnea that is not worse recently), PND, increased pedal edema. Denies chest pain, tachycardia. Patient is not on ACE-I - 2/2 angioedema with mulitple ACE-I in past. Patient is not on BB - she is supposed to be but has been off of Metoprolol and Spironolactone x 1 month.  5)  Depression - is not well controlled on current therapy - has been out of Celexa 20mg  and was previously well controlled on this medication. Associated symptoms include:depressed mood, fatigue and tearful. Denies associated suicidal ideation, homicidal ideation. Currently, the patient does not follow with mental health services.   Review of Systems: Per HPI    Current Outpatient Medications: Medication Sig  . albuterol (VENTOLIN HFA) 108 (90 BASE) MCG/ACT inhaler Inhale 2 puffs into the lungs every 6 (six) hours as needed. For wheezing  . aspirin EC 81 MG tablet Take 1 tablet (81 mg total) by mouth daily.  . carbamazepine (TEGRETOL) 200 MG tablet Take 1 tablet (200 mg total) by mouth 3 (three) times daily. Take 1 pill in AM and 2 pills in PM  . citalopram (CELEXA) 20 MG tablet Take 20 mg by mouth daily.   . cloNIDine (CATAPRES) 0.2 MG tablet Take 1 tablet (0.2 mg total) by mouth 2 (two) times daily.  . diclofenac sodium (VOLTAREN) 1 % GEL Apply 2 g topically 4 (four) times daily as needed (for pain).  . furosemide (LASIX) 40 MG tablet Take 1 tablet (40 mg total) by mouth 2 (two) times daily. If you gain any more than 2-3 pounds in a 24 hour period of time, take an extra dose of lasix.  [name] ibuprofen (ADVIL,MOTRIN) 200 MG tablet Take 600 mg by mouth every 6 (six) hours as needed for pain.  [name] insulin aspart (NOVOLOG FLEXPEN) 100 UNIT/ML injection Inject 7 Units into the skin 3 (three) times daily before meals.  . methocarbamol (  ROBAXIN) 500 MG tablet Take 1 tablet (500 mg total) by mouth every 12 (twelve) hours. For muscle spasm  . metoprolol succinate (TOPROL-XL) 100 MG 24 hr tablet Take 100 mg by mouth daily. Take with or immediately following a meal.  . oxyCODONE-acetaminophen (PERCOCET) 10-325 MG per tablet Take 1 tablet by mouth every 8 (eight) hours as needed. For pain  . pantoprazole (PROTONIX) 40 MG tablet Take 40 mg by mouth daily.   . potassium chloride (K-DUR) 10 MEQ tablet Take 1 tablet  (10 mEq total) by mouth daily.  . promethazine (PHENERGAN) 25 MG tablet Take 1 tablet (25 mg total) by mouth every 6 (six) hours as needed for nausea.  [name] spironolactone (ALDACTONE) 25 MG tablet Take 1 tablet (25 mg total) by mouth daily.  . traMADol (ULTRAM) 50 MG tablet Take 1 tablet (50 mg total) by mouth every 6 (six) hours as needed for pain.  [name] insulin glargine (LANTUS) 100 UNIT/ML injection Inject 0.65 mLs (65 Units total) into the skin daily.   No current facility-administered medications for this visit.     Allergies  Allergen Reactions  . Clindamycin Anaphylaxis and Swelling    Swelling of pain  . Enalapril Maleate Swelling  . Lisinopril Swelling    Past Medical History  Diagnosis Date  . Hypertension     Poorly controlled. Has had HTN since age 49. Angioedema with ACEI.  24 Hr urine and renal arterial dopplers ordered . . . Never done  . Type II or unspecified type diabetes mellitus without mention of complication, uncontrolled DX: 2002  . NICM (nonischemic cardiomyopathy)     EF 45-50% in 8/12, cath 6/12 showed normal coronaries, EF 50-55% by LV gram  . Morbid obesity   . Allergic rhinitis   . HLD (hyperlipidemia)   . OSA on CPAP     sleep study in 8/12 showed moderate to severe OSA requiring CPAP  . Chronic lower back pain     secondary to DJD, obsetiy, hip problems. Followed by Dr. Ivory Broad (pain management)  . 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 Chronic diastolic heart failure      Primarily diastolic CHF: Likely due to uncontrolled HTN. Last echo (8/12) with EF 45-50%, mild to moderate LVH with some asymmetric septal hypertrophy, RV normal size and systolic function. EF 50-55% by LV-gram in 6/12.   . Degeneration of lumbar or lumbosacral intervertebral disc   . Polyneuropathy in diabetes(357.2)   . Thoracic or lumbosacral neuritis or radiculitis, unspecified   . Calcifying tendinitis of shoulder   . LBBB (left bundle branch block)   .  Chronic combined systolic and diastolic CHF (congestive heart failure)     EF 40-45% by echo 12/06/2012  . Coronary artery disease     questionable. LHC 05/2011 showing normal coronaries // Followed at [facility], Dr. Shirlee Latch  . GERD (gastroesophageal reflux disease)     Past Surgical History  Procedure Laterality Date  . Breast biopsies Bilateral 2011    patient reports benign results  . Tubal ligation  05/31/1985  . Hand surgery Left   . Shoulder surgery Right   . Dental surgery      tumors removed   . Cardiac catheterization  05/2011    Family History  Problem Relation Age of Onset  . Heart disease Mother 57    Died of MI at age 62 yo  . Heart disease Paternal Grandmother     requiring  pacemaker.  [name] Heart disease Paternal Grandfather 39    Died of MI at possibly age 22-53yo  . Stroke Paternal Grandfather   . Heart disease Father 41    MI age 58yo requiring stenting  . Kidney disease Mother     requiring dialysis  . Congestive Heart Failure Mother   . Diabetes Father   . Diabetes Brother   . Heart disease Brother 15    MI at age 62 years old  . Breast cancer Paternal Aunt   . Breast cancer Maternal Grandmother      Social History History   Social History  . Marital Status: Single    Spouse Name: N/A    Number of Children: 2  . Years of Education: 9th grade   Occupational History  . Unemployed     planning on getting disability   Social History Main Topics  . Smoking status: Never Smoker   . Smokeless tobacco: Never Used  . Alcohol Use: No  . Drug Use: No  . Sexually Active: Yes    Birth Control/ Protection: Surgical   Other Topics Concern  . None   Social History Narrative   Lives in [place] with her son. Is able to read and write fluently in [place].     Objective:    Physical Exam: Filed Vitals:   04/09/13 1107  BP: 170/110  Pulse: 111  Temp: 97.2 F (36.2 C)     General: Vital signs reviewed and noted. Well-developed,  well-nourished, in no acute distress; alert, appropriate and cooperative throughout examination.  Head: Normocephalic, atraumatic.  Eyes: conjunctivae/corneas clear. PERRL, EOM's intact.   Ears: TM nonerythematous, not bulging, good light reflex bilaterally.  Nose: Mucous membranes moist, not inflammed, nonerythematous.  Throat: Oropharynx nonerythematous, no exudate appreciated.   Neck: No deformities, masses, or tenderness noted.  Lungs:  Normal respiratory effort. Clear to auscultation BL without crackles or wheezes.  Heart: RRR. S1 and S2 normal without gallop, rubs. No murmur.  Abdomen:  BS normoactive. Soft, Nondistended, epigastric TTP.  No masses or organomegaly.  Extremities: 1-2+ pretibial edema to the knees bilaterally.  Neurologic: A&O X3, CN II - XII are grossly intact. Motor strength is 5/5 in the all 4 extremities.     Assessment/ Plan:   The patient's case and plan of care was discussed with attending physician, Dr. Doneen Poisson.  Greater than 60 minutes spent with the patient with > 50% of time spent in direct face-to-face counseling and coordination of care.

## 2013-04-09 NOTE — Assessment & Plan Note (Signed)
Pertinent Labs: Lab Results  Component Value Date   HGBA1C 11.4* 12/07/2012   CREATININE 0.83 04/01/2013   MICROALBUR 1.03 02/04/2009   CHOL 194 12/07/2012   HDL 47 12/07/2012   TRIG 224* 12/07/2012    Assessment: Disease Control: poor control (HgbA1C >9%)  Progress toward goals: unchanged  Barriers to meeting goals: financial need, transportation problems, nonadherence to medications and lack of understanding of disease management    She has been out of her Lantus for several months  She has been without a meter x 1 month  She is currently only taking her Novolog 7 units TID with meals, but admits to missing it 1-2 times weekly.  Plan: Glucometer log was not reviewed today, as pt did not have glucometer available for review.   Restart Lantus at reduced dose of 20 units qHS. Plan to escalate only as she gets more data for review.  Continue TID Novolog.  Start checking BG TID with meals and PRN if she is feeling poorly.  Refill of diabetic supplies sent to [facility] pharmacy.  RX for Lantus, Novolog to be sent to [facility] - will be faxed.  Needs to see [name] Plyler  reminded to bring blood glucose meter & log to each visit  Educational resources provided:    Self management tools provided:    Home glucose monitoring recommendation: 3 times a day before meals

## 2013-04-09 NOTE — Assessment & Plan Note (Addendum)
Pertinent Data: BP Readings from Last 3 Encounters:  04/09/13 170/110  04/02/13 149/90  04/01/13 146/91   Renal Duplex US (04/2012) - Normal renal arteries bilaterally.  Basic Metabolic Panel:    Component Value Date/Time   NA 135 04/01/2013 0450   K 3.7 04/01/2013 0450   CL 98 04/01/2013 0450   CO2 28 04/01/2013 0450   BUN 14 04/01/2013 0450   CREATININE 0.83 04/01/2013 0450   GLUCOSE 229* 04/01/2013 0450   CALCIUM 9.2 04/01/2013 0450    Assessment: Disease Control: moderately elevated  Progress toward goals: deteriorated  Barriers to meeting goals: transportation problems, nonadherence to medications and lack of understanding of disease management    She has been out of her Metoprolol XL 100mg  and Spironolactone 25mg  daily x 2 months.   She is currently taking her Lasix, Clonidine generally without missing doses.    I think compliance is the main issue with her historically uncontrolled HTN. I have had a discussion with her regarding Clonidine and the danger of its intermittent usage. She expresses understanding.  The patient has had history of uncontrolled hypertension since age 47 years old. May be contributed by uncontrolled OSA with only intermittent compliance of her CPAP secondary to discomfort - this may further serve as a reason to her uncontrolled HTN, in addition to her obesity.  Seems that when she is taking her medications regularly, she is actually able to get appropriate BP control.  Plan:  Continue Lasix and clonidine.  The patient was educated about the significant adverse effects of intermittent use of clonidine including hypertension and severe rebound hypertension. She was advised to regularly take this medication without missing doses.  She was advised to inform the physician at followup if unable to take clonidine regularly, in which case, we may consider alternative therapy.  Can recheck BMET next visit, assess need for continued K  supplementation.  Educational resources provided: handout  Self management tools provided:

## 2013-04-10 LAB — URINALYSIS, ROUTINE W REFLEX MICROSCOPIC
Bilirubin Urine: NEGATIVE
Nitrite: NEGATIVE
Protein, ur: NEGATIVE mg/dL
Urobilinogen, UA: 0.2 mg/dL (ref 0.0–1.0)

## 2013-04-10 LAB — URINALYSIS, MICROSCOPIC ONLY
Bacteria, UA: NONE SEEN
Crystals: NONE SEEN

## 2013-04-10 MED ORDER — CIPROFLOXACIN HCL 250 MG PO TABS: 250.0000 mg | ORAL_TABLET | Freq: Two times a day (BID) | ORAL | Status: DC

## 2013-04-10 NOTE — Progress Notes (Signed)
Quick Note:  Will tx for UTI. See note for details. ______

## 2013-04-10 NOTE — Addendum Note (Signed)
Addended by: Priscella Mann on: 04/10/2013 10:44 AM   Modules accepted: Orders

## 2013-04-10 NOTE — Progress Notes (Signed)
Case discussed with Dr. Kalia-Reynolds at the time of the visit.  We reviewed the resident's history and exam and pertinent patient test results.  I agree with the assessment, diagnosis and plan of care documented in the resident's note. 

## 2013-04-11 ENCOUNTER — Ambulatory Visit: Payer: Medicaid Other | Admitting: Internal Medicine

## 2013-04-23 ENCOUNTER — Telehealth: Payer: Self-pay | Admitting: *Deleted

## 2013-04-23 NOTE — Telephone Encounter (Signed)
PHQ9 = 17... This is a depression screening tools, any >9 is serious, this was done at the first of April, if questions call sherry

## 2013-04-25 ENCOUNTER — Other Ambulatory Visit: Payer: Self-pay | Admitting: *Deleted

## 2013-04-25 MED ORDER — POLYETHYLENE GLYCOL 3350 17 G PO PACK: 17.0000 g | PACK | Freq: Every day | ORAL | Status: DC

## 2013-04-27 ENCOUNTER — Emergency Department (HOSPITAL_COMMUNITY): Payer: Medicaid Other

## 2013-04-27 ENCOUNTER — Inpatient Hospital Stay (HOSPITAL_COMMUNITY)
Admission: EM | Admit: 2013-04-27 | Discharge: 2013-04-30 | DRG: 292 | Disposition: A | Discharge status: Home / Self Care | Payer: Medicaid Other | Attending: Internal Medicine | Admitting: Internal Medicine

## 2013-04-27 ENCOUNTER — Ambulatory Visit: Payer: Medicaid Other | Admitting: Internal Medicine

## 2013-04-27 ENCOUNTER — Encounter (HOSPITAL_COMMUNITY): Payer: Self-pay | Admitting: *Deleted

## 2013-04-27 DIAGNOSIS — Z823 Family history of stroke: Secondary | ICD-10-CM

## 2013-04-27 DIAGNOSIS — I447 Left bundle-branch block, unspecified: Secondary | ICD-10-CM | POA: Diagnosis present

## 2013-04-27 DIAGNOSIS — Z9119 Patient's noncompliance with other medical treatment and regimen: Secondary | ICD-10-CM

## 2013-04-27 DIAGNOSIS — D72829 Elevated white blood cell count, unspecified: Secondary | ICD-10-CM | POA: Diagnosis present

## 2013-04-27 DIAGNOSIS — I5042 Chronic combined systolic (congestive) and diastolic (congestive) heart failure: Secondary | ICD-10-CM

## 2013-04-27 DIAGNOSIS — I428 Other cardiomyopathies: Secondary | ICD-10-CM | POA: Diagnosis present

## 2013-04-27 DIAGNOSIS — Z833 Family history of diabetes mellitus: Secondary | ICD-10-CM

## 2013-04-27 DIAGNOSIS — E785 Hyperlipidemia, unspecified: Secondary | ICD-10-CM

## 2013-04-27 DIAGNOSIS — K219 Gastro-esophageal reflux disease without esophagitis: Secondary | ICD-10-CM | POA: Diagnosis present

## 2013-04-27 DIAGNOSIS — M47816 Spondylosis without myelopathy or radiculopathy, lumbar region: Secondary | ICD-10-CM

## 2013-04-27 DIAGNOSIS — Z91199 Patient's noncompliance with other medical treatment and regimen due to unspecified reason: Secondary | ICD-10-CM

## 2013-04-27 DIAGNOSIS — E669 Obesity, unspecified: Secondary | ICD-10-CM

## 2013-04-27 DIAGNOSIS — Z6841 Body Mass Index (BMI) 40.0 and over, adult: Secondary | ICD-10-CM

## 2013-04-27 DIAGNOSIS — I951 Orthostatic hypotension: Secondary | ICD-10-CM | POA: Diagnosis present

## 2013-04-27 DIAGNOSIS — J449 Chronic obstructive pulmonary disease, unspecified: Secondary | ICD-10-CM | POA: Diagnosis present

## 2013-04-27 DIAGNOSIS — F329 Major depressive disorder, single episode, unspecified: Secondary | ICD-10-CM | POA: Diagnosis present

## 2013-04-27 DIAGNOSIS — R079 Chest pain, unspecified: Secondary | ICD-10-CM

## 2013-04-27 DIAGNOSIS — J309 Allergic rhinitis, unspecified: Secondary | ICD-10-CM | POA: Diagnosis present

## 2013-04-27 DIAGNOSIS — M1610 Unilateral primary osteoarthritis, unspecified hip: Secondary | ICD-10-CM | POA: Diagnosis present

## 2013-04-27 DIAGNOSIS — Z950 Presence of cardiac pacemaker: Secondary | ICD-10-CM

## 2013-04-27 DIAGNOSIS — M1712 Unilateral primary osteoarthritis, left knee: Secondary | ICD-10-CM

## 2013-04-27 DIAGNOSIS — F3289 Other specified depressive episodes: Secondary | ICD-10-CM | POA: Diagnosis present

## 2013-04-27 DIAGNOSIS — M169 Osteoarthritis of hip, unspecified: Secondary | ICD-10-CM | POA: Diagnosis present

## 2013-04-27 DIAGNOSIS — R0600 Dyspnea, unspecified: Secondary | ICD-10-CM

## 2013-04-27 DIAGNOSIS — I5043 Acute on chronic combined systolic (congestive) and diastolic (congestive) heart failure: Principal | ICD-10-CM | POA: Diagnosis present

## 2013-04-27 DIAGNOSIS — Z803 Family history of malignant neoplasm of breast: Secondary | ICD-10-CM

## 2013-04-27 DIAGNOSIS — E1140 Type 2 diabetes mellitus with diabetic neuropathy, unspecified: Secondary | ICD-10-CM | POA: Diagnosis present

## 2013-04-27 DIAGNOSIS — M545 Low back pain, unspecified: Secondary | ICD-10-CM | POA: Diagnosis present

## 2013-04-27 DIAGNOSIS — I5032 Chronic diastolic (congestive) heart failure: Secondary | ICD-10-CM

## 2013-04-27 DIAGNOSIS — E1142 Type 2 diabetes mellitus with diabetic polyneuropathy: Secondary | ICD-10-CM

## 2013-04-27 DIAGNOSIS — I10 Essential (primary) hypertension: Secondary | ICD-10-CM

## 2013-04-27 DIAGNOSIS — G4733 Obstructive sleep apnea (adult) (pediatric): Secondary | ICD-10-CM

## 2013-04-27 DIAGNOSIS — M7530 Calcific tendinitis of unspecified shoulder: Secondary | ICD-10-CM | POA: Diagnosis present

## 2013-04-27 DIAGNOSIS — I509 Heart failure, unspecified: Secondary | ICD-10-CM

## 2013-04-27 DIAGNOSIS — E1149 Type 2 diabetes mellitus with other diabetic neurological complication: Secondary | ICD-10-CM | POA: Diagnosis present

## 2013-04-27 DIAGNOSIS — Z8249 Family history of ischemic heart disease and other diseases of the circulatory system: Secondary | ICD-10-CM

## 2013-04-27 DIAGNOSIS — F32A Depression, unspecified: Secondary | ICD-10-CM

## 2013-04-27 DIAGNOSIS — J4489 Other specified chronic obstructive pulmonary disease: Secondary | ICD-10-CM | POA: Diagnosis present

## 2013-04-27 DIAGNOSIS — IMO0002 Reserved for concepts with insufficient information to code with codable children: Secondary | ICD-10-CM

## 2013-04-27 DIAGNOSIS — I2510 Atherosclerotic heart disease of native coronary artery without angina pectoris: Secondary | ICD-10-CM | POA: Diagnosis present

## 2013-04-27 DIAGNOSIS — G8929 Other chronic pain: Secondary | ICD-10-CM | POA: Diagnosis present

## 2013-04-27 LAB — GLUCOSE, CAPILLARY
Glucose-Capillary: 227 mg/dL — ABNORMAL HIGH (ref 70–99)
Glucose-Capillary: 207 mg/dL — ABNORMAL HIGH (ref 70–99)

## 2013-04-27 LAB — MAGNESIUM: Magnesium: 1.5 mg/dL (ref 1.5–2.5)

## 2013-04-27 LAB — URINALYSIS, ROUTINE W REFLEX MICROSCOPIC
Leukocytes, UA: NEGATIVE
Nitrite: NEGATIVE
Specific Gravity, Urine: 1.039 — ABNORMAL HIGH (ref 1.005–1.030)
Urobilinogen, UA: 0.2 mg/dL (ref 0.0–1.0)

## 2013-04-27 LAB — TROPONIN I
Troponin I: <0.3 ng/mL (ref <0.30)
Troponin I: <0.3 ng/mL (ref <0.30)

## 2013-04-27 LAB — CBC
HCT: 40 % (ref 36.0–46.0)
Hemoglobin: 13.4 g/dL (ref 12.0–15.0)
Hemoglobin: 12.4 g/dL (ref 12.0–15.0)
MCH: 27.5 pg (ref 26.0–34.0)
MCHC: 33.5 g/dL (ref 30.0–36.0)
MCHC: 33.5 g/dL (ref 30.0–36.0)
RDW: 13 % (ref 11.5–15.5)
WBC: 11.8 10*3/uL — ABNORMAL HIGH (ref 4.0–10.5)

## 2013-04-27 LAB — PHOSPHORUS: Phosphorus: 4.4 mg/dL (ref 2.3–4.6)

## 2013-04-27 LAB — BASIC METABOLIC PANEL
BUN: 10 mg/dL (ref 6–23)
CO2: 25 mEq/L (ref 19–32)
GFR calc non Af Amer: 87 mL/min — ABNORMAL LOW (ref >90)
Glucose, Bld: 306 mg/dL — ABNORMAL HIGH (ref 70–99)
Potassium: 4 mEq/L (ref 3.5–5.1)

## 2013-04-27 LAB — PROTIME-INR: INR: 0.96 (ref 0.00–1.49)

## 2013-04-27 LAB — URINE MICROSCOPIC-ADD ON

## 2013-04-27 LAB — DIFFERENTIAL
Basophils Absolute: 0 10*3/uL (ref 0.0–0.1)
Basophils Relative: 0 % (ref 0–1)
Eosinophils Relative: 1 % (ref 0–5)
Monocytes Absolute: 0.8 10*3/uL (ref 0.1–1.0)

## 2013-04-27 MED ORDER — METOPROLOL SUCCINATE ER 100 MG PO TB24
100.0000 mg | ORAL_TABLET | Freq: Every day | ORAL | Status: DC
  Administered 2013-04-27 – 2013-04-29 (×3): 100 mg via ORAL
  Filled 2013-04-27 (×3): qty 1

## 2013-04-27 MED ORDER — CLONIDINE HCL 0.2 MG PO TABS
0.2000 mg | ORAL_TABLET | Freq: Two times a day (BID) | ORAL | Status: DC
  Administered 2013-04-27 – 2013-04-29 (×4): 0.2 mg via ORAL
  Filled 2013-04-27 (×6): qty 1

## 2013-04-27 MED ORDER — DICLOFENAC SODIUM 1 % TD GEL: 2.0000 g | Freq: Four times a day (QID) | TRANSDERMAL | Status: DC | PRN

## 2013-04-27 MED ORDER — CARBAMAZEPINE 200 MG PO TABS
400.0000 mg | ORAL_TABLET | Freq: Every day | ORAL | Status: DC
  Administered 2013-04-27 – 2013-04-29 (×3): 400 mg via ORAL
  Filled 2013-04-27 (×4): qty 2

## 2013-04-27 MED ORDER — NITROGLYCERIN 0.4 MG SL SUBL
0.4000 mg | SUBLINGUAL_TABLET | SUBLINGUAL | Status: DC | PRN
  Administered 2013-04-27 (×2): 0.4 mg via SUBLINGUAL
  Filled 2013-04-27: qty 25

## 2013-04-27 MED ORDER — FUROSEMIDE 10 MG/ML IJ SOLN
60.0000 mg | Freq: Two times a day (BID) | INTRAMUSCULAR | Status: DC
  Administered 2013-04-27 – 2013-04-28 (×2): 60 mg via INTRAVENOUS
  Filled 2013-04-27 (×3): qty 6

## 2013-04-27 MED ORDER — FUROSEMIDE 10 MG/ML IJ SOLN
80.0000 mg | Freq: Once | INTRAMUSCULAR | Status: AC
  Administered 2013-04-27: 80 mg via INTRAVENOUS
  Filled 2013-04-27: qty 8

## 2013-04-27 MED ORDER — ACETAMINOPHEN 650 MG RE SUPP: 650.0000 mg | Freq: Four times a day (QID) | RECTAL | Status: DC | PRN

## 2013-04-27 MED ORDER — NITROGLYCERIN IN D5W 200-5 MCG/ML-% IV SOLN
50.0000 ug/min | Freq: Once | INTRAVENOUS | Status: AC
  Administered 2013-04-27: 50 ug/min via INTRAVENOUS
  Filled 2013-04-27: qty 250

## 2013-04-27 MED ORDER — SODIUM CHLORIDE 0.9 % IJ SOLN: 3.0000 mL | INTRAMUSCULAR | Status: DC | PRN

## 2013-04-27 MED ORDER — ACETAMINOPHEN 325 MG PO TABS: 650.0000 mg | ORAL_TABLET | Freq: Four times a day (QID) | ORAL | Status: DC | PRN

## 2013-04-27 MED ORDER — ASPIRIN EC 81 MG PO TBEC
81.0000 mg | DELAYED_RELEASE_TABLET | Freq: Every morning | ORAL | Status: DC
  Administered 2013-04-27 – 2013-04-30 (×4): 81 mg via ORAL
  Filled 2013-04-27 (×4): qty 1

## 2013-04-27 MED ORDER — INSULIN GLARGINE 100 UNIT/ML ~~LOC~~ SOLN
20.0000 [IU] | Freq: Every day | SUBCUTANEOUS | Status: DC
  Administered 2013-04-27: 20 [IU] via SUBCUTANEOUS
  Filled 2013-04-27 (×2): qty 0.2

## 2013-04-27 MED ORDER — IBUPROFEN 600 MG PO TABS
600.0000 mg | ORAL_TABLET | Freq: Four times a day (QID) | ORAL | Status: DC | PRN
  Filled 2013-04-27: qty 1

## 2013-04-27 MED ORDER — SODIUM CHLORIDE 0.9 % IJ SOLN
3.0000 mL | Freq: Two times a day (BID) | INTRAMUSCULAR | Status: DC
  Administered 2013-04-27 – 2013-04-29 (×6): 3 mL via INTRAVENOUS

## 2013-04-27 MED ORDER — CARBAMAZEPINE 200 MG PO TABS: 200.0000 mg | ORAL_TABLET | Freq: Two times a day (BID) | ORAL | Status: DC

## 2013-04-27 MED ORDER — CARBAMAZEPINE 200 MG PO TABS
200.0000 mg | ORAL_TABLET | Freq: Every day | ORAL | Status: DC
  Administered 2013-04-27 – 2013-04-30 (×4): 200 mg via ORAL
  Filled 2013-04-27 (×4): qty 1

## 2013-04-27 MED ORDER — SODIUM CHLORIDE 0.9 % IV SOLN: 250.0000 mL | INTRAVENOUS | Status: DC | PRN

## 2013-04-27 MED ORDER — INSULIN ASPART 100 UNIT/ML ~~LOC~~ SOLN
0.0000 [IU] | Freq: Three times a day (TID) | SUBCUTANEOUS | Status: DC
  Administered 2013-04-27 (×3): 3 [IU] via SUBCUTANEOUS
  Administered 2013-04-28 (×2): 5 [IU] via SUBCUTANEOUS
  Administered 2013-04-28 – 2013-04-29 (×2): 2 [IU] via SUBCUTANEOUS
  Administered 2013-04-29 – 2013-04-30 (×3): 3 [IU] via SUBCUTANEOUS

## 2013-04-27 MED ORDER — SPIRONOLACTONE 25 MG PO TABS
25.0000 mg | ORAL_TABLET | Freq: Every day | ORAL | Status: DC
  Administered 2013-04-27 – 2013-04-29 (×3): 25 mg via ORAL
  Filled 2013-04-27 (×3): qty 1

## 2013-04-27 MED ORDER — HEPARIN SODIUM (PORCINE) 5000 UNIT/ML IJ SOLN
5000.0000 [IU] | Freq: Three times a day (TID) | INTRAMUSCULAR | Status: DC
Start: 2013-04-27 — End: 2013-04-30
  Administered 2013-04-27 – 2013-04-30 (×9): 5000 [IU] via SUBCUTANEOUS
  Filled 2013-04-27 (×12): qty 1

## 2013-04-27 MED ORDER — PANTOPRAZOLE SODIUM 40 MG PO TBEC
40.0000 mg | DELAYED_RELEASE_TABLET | Freq: Every day | ORAL | Status: DC
  Administered 2013-04-27 – 2013-04-30 (×4): 40 mg via ORAL
  Filled 2013-04-27 (×4): qty 1

## 2013-04-27 MED ORDER — POTASSIUM CHLORIDE ER 10 MEQ PO TBCR
10.0000 meq | EXTENDED_RELEASE_TABLET | Freq: Every day | ORAL | Status: DC
  Administered 2013-04-27 – 2013-04-29 (×3): 10 meq via ORAL
  Filled 2013-04-27 (×3): qty 1

## 2013-04-27 MED ORDER — ALBUTEROL SULFATE HFA 108 (90 BASE) MCG/ACT IN AERS
2.0000 | INHALATION_SPRAY | Freq: Four times a day (QID) | RESPIRATORY_TRACT | Status: DC | PRN
  Filled 2013-04-27 (×2): qty 6.7

## 2013-04-27 MED ORDER — ASPIRIN 325 MG PO TABS
325.0000 mg | ORAL_TABLET | ORAL | Status: AC
  Administered 2013-04-27: 325 mg via ORAL
  Filled 2013-04-27: qty 1

## 2013-04-27 MED ORDER — POLYETHYLENE GLYCOL 3350 17 G PO PACK
17.0000 g | PACK | Freq: Every day | ORAL | Status: DC | PRN
  Filled 2013-04-27 (×2): qty 1

## 2013-04-27 MED ORDER — METHOCARBAMOL 500 MG PO TABS
500.0000 mg | ORAL_TABLET | Freq: Two times a day (BID) | ORAL | Status: DC | PRN
  Filled 2013-04-27: qty 1

## 2013-04-27 MED ORDER — SODIUM CHLORIDE 0.9 % IJ SOLN
3.0000 mL | Freq: Two times a day (BID) | INTRAMUSCULAR | Status: DC
  Administered 2013-04-28: 3 mL via INTRAVENOUS

## 2013-04-27 NOTE — Progress Notes (Signed)
Utilization review completed.  

## 2013-04-27 NOTE — H&P (Signed)
Hospital Admission Note Date: 04/27/2013  Patient name: [name] Medical record number: [id number] Date of birth: October 12, 1966 Age: 47 y.o. Gender: female PCP: Marca Ancona, MD  Medical Service: Internal Medicine  Attending physician: Dr. Kem Kays    1st Contact: Sadek    Pager: [phone] 2nd Contact: Dierdre Searles    Pager: [phone] After 5 pm or weekends: 1st Contact:      Pager: [phone] 2nd Contact:      Pager: [phone]  Chief Complaint: SOB  History of Present Illness: 47 y.o. female history of poorly controlled hypertension, poorly controlled type 2 diabetes, obstructive sleep apnea, and chronic combined systolic and diastolic heart failure who presents with acute onset of shortness of breath. She was transferred to IMTS from [facility] to [facility] for further management   Patient was very sleep at the time of history taking , most of the history is obtained from the chart.  She states that she has been having gradually progressive worsening SOB x 2 weeks, that got really worse yesterday night. She did have some chest tightness when she presented to the ED that has gotten better now. She also reports waxing and waning bilateral LE swelling for last 2 weeks. She also reports having some cough where she is bringing whitish phlegm. Denies any orthopnea or PND or palpitations.   Reviewing the EDP note- it seems like the patient was pulled over by the police earlier ( 5/15) after which, (and receiving a ticket) patient started to have some shortness of breath, and productive cough. She's also had some chest tightness in the center of her chest that is nonradiating, not associated with diaphoresis, nausea vomiting.   ED course: She was started on nitroglycerine gtt, was given 80 mg of IV lasix with the output ~2.5 litres.    Meds: Current Facility-Administered Medications  Medication Dose Route Frequency Provider Last Rate Last Dose  . 0.9 %  sodium chloride infusion  250 mL Intravenous PRN  Lorretta Harp, MD      . acetaminophen (TYLENOL) tablet 650 mg  650 mg Oral Q6H PRN Lorretta Harp, MD       Or  . acetaminophen (TYLENOL) suppository 650 mg  650 mg Rectal Q6H PRN Lorretta Harp, MD      . albuterol (PROVENTIL HFA;VENTOLIN HFA) 108 (90 BASE) MCG/ACT inhaler 2 puff  2 puff Inhalation Q6H PRN Lorretta Harp, MD      . aspirin EC tablet 81 mg  81 mg Oral q morning - 10a Lorretta Harp, MD   81 mg at 04/27/13 0908  . carbamazepine (TEGRETOL) tablet 200 mg  200 mg Oral Daily Gardiner Barefoot, MD   200 mg at 04/27/13 0911  . carbamazepine (TEGRETOL) tablet 400 mg  400 mg Oral QHS Gardiner Barefoot, MD      . cloNIDine (CATAPRES) tablet 0.2 mg  0.2 mg Oral BID Lorretta Harp, MD   0.2 mg at 04/27/13 0910  . diclofenac sodium (VOLTAREN) 1 % transdermal gel 2 g  2 g Topical QID PRN Lorretta Harp, MD      . furosemide (LASIX) injection 60 mg  60 mg Intravenous BID Lorretta Harp, MD      . heparin injection 5,000 Units  5,000 Units Subcutaneous Q8H Lorretta Harp, MD   5,000 Units at 04/27/13 0910  . ibuprofen (ADVIL,MOTRIN) tablet 600 mg  600 mg Oral Q6H PRN Lorretta Harp, MD      . insulin aspart (novoLOG) injection 0-9 Units  0-9 Units  Subcutaneous TID WC Lorretta Harp, MD   3 Units at 04/27/13 [phone]  . insulin glargine (LANTUS) injection 20 Units  20 Units Subcutaneous QHS Lorretta Harp, MD      . methocarbamol (ROBAXIN) tablet 500 mg  500 mg Oral Q12H PRN Lorretta Harp, MD      . metoprolol succinate (TOPROL-XL) 24 hr tablet 100 mg  100 mg Oral Daily Lorretta Harp, MD   100 mg at 04/27/13 0910  . pantoprazole (PROTONIX) EC tablet 40 mg  40 mg Oral Daily Lorretta Harp, MD   40 mg at 04/27/13 0912  . polyethylene glycol (MIRALAX / GLYCOLAX) packet 17 g  17 g Oral Daily PRN Lorretta Harp, MD      . potassium chloride (K-DUR) CR tablet 10 mEq  10 mEq Oral Daily Lorretta Harp, MD   10 mEq at 04/27/13 0910  . sodium chloride 0.9 % injection 3 mL  3 mL Intravenous Q12H Lorretta Harp, MD      . sodium chloride 0.9 % injection 3 mL  3 mL Intravenous Q12H Lorretta Harp, MD   3 mL  at 04/27/13 0913  . sodium chloride 0.9 % injection 3 mL  3 mL Intravenous PRN Lorretta Harp, MD      . spironolactone (ALDACTONE) tablet 25 mg  25 mg Oral Daily Lorretta Harp, MD   25 mg at 04/27/13 9604   Allergies: Allergies as of 04/27/2013 - Review Complete 04/27/2013  Allergen Reaction Noted  . Clindamycin Anaphylaxis and Swelling   . Enalapril maleate Swelling   . Lisinopril Swelling    Past Medical History  Diagnosis Date  . Hypertension     Poorly controlled. Has had HTN since age 88. Angioedema with ACEI.  24 Hr urine and renal arterial dopplers ordered . . . Never done  . Type II or unspecified type diabetes mellitus without mention of complication, uncontrolled DX: 2002  . NICM (nonischemic cardiomyopathy)     EF 45-50% in 8/12, cath 6/12 showed normal coronaries, EF 50-55% by LV gram  . Morbid obesity   . Allergic rhinitis   . HLD (hyperlipidemia)   . OSA on CPAP     sleep study in 8/12 showed moderate to severe OSA requiring CPAP  . Chronic lower back pain     secondary to DJD, obsetiy, hip problems. Followed by Dr. Ivory Broad (pain management)  . 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 Chronic diastolic heart failure      Primarily diastolic CHF: Likely due to uncontrolled HTN. Last echo (8/12) with EF 45-50%, mild to moderate LVH with some asymmetric septal hypertrophy, RV normal size and systolic function. EF 50-55% by LV-gram in 6/12.   . Degeneration of lumbar or lumbosacral intervertebral disc   . Polyneuropathy in diabetes(357.2)   . Thoracic or lumbosacral neuritis or radiculitis, unspecified   . Calcifying tendinitis of shoulder   . LBBB (left bundle branch block)   . Chronic combined systolic and diastolic CHF (congestive heart failure)     EF 40-45% by echo 12/06/2012  . Coronary artery disease     questionable. LHC 05/2011 showing normal coronaries // Followed at [facility], Dr. Shirlee Latch  . GERD (gastroesophageal reflux disease)     Past Surgical History  Procedure Laterality Date  . Breast biopsies Bilateral 2011    patient reports benign results  . Tubal ligation  05/31/1985  . Hand surgery Left   . Shoulder surgery Right   .  Dental surgery      tumors removed   . Cardiac catheterization  05/2011   Family History  Problem Relation Age of Onset  . Heart disease Mother 84    Died of MI at age 70 yo  . Heart disease Paternal Grandmother     requiring pacemaker.  [name] Heart disease Paternal Grandfather 16    Died of MI at possibly age 20-53yo  . Stroke Paternal Grandfather   . Heart disease Father 57    MI age 50yo requiring stenting  . Kidney disease Mother     requiring dialysis  . Congestive Heart Failure Mother   . Diabetes Father   . Diabetes Brother   . Heart disease Brother 104    MI at age 71 years old  . Breast cancer Paternal Aunt   . Breast cancer Maternal Grandmother    History   Social History  . Marital Status: Single    Spouse Name: N/A    Number of Children: 2  . Years of Education: 9th grade   Occupational History  . Unemployed     planning on getting disability   Social History Main Topics  . Smoking status: Never Smoker   . Smokeless tobacco: Never Used  . Alcohol Use: No  . Drug Use: No  . Sexually Active: Yes    Birth Control/ Protection: Surgical   Other Topics Concern  . Not on file   Social History Narrative   Lives in [place] with her son. Is able to read and write fluently in [place].    Review of Systems: Constitutional: negative for anorexia, chills, fatigue, fevers and malaise HEENT: negative for ear drainage, epistaxis, hearing loss, hoarseness, sore throat and tinnitus Respiratory: negative for cough, emphysema, sputum, stridor and wheezing Cardiovascular: negative for chest pain, irregular heart beat, , near-syncope, orthopnea and palpitations, + LLE swelling Gastrointestinal: negative for abdominal pain, change in bowel habits, diarrhea, reflux  symptoms and vomiting Hematologic/lymphatic: negative for bleeding, easy bruising and lymphadenopathy Musculoskeletal:negative for arthralgias, bone pain and myalgias Neurological: negative for coordination problems, dizziness, gait problems, headaches, paresthesia and seizures  Physical Exam: Blood pressure 142/99, pulse 105, temperature 97.3 F (36.3 C), temperature source Oral, resp. rate 17, height 5\' 9"  (1.753 m), weight 288 lb 5.8 oz (130.8 kg), last menstrual period 04/16/2013, SpO2 99.00%. BP 142/99  Pulse 105  Temp(Src) 97.3 F (36.3 C) (Oral)  Resp 17  Ht 5\' 9"  (1.753 m)  Wt 288 lb 5.8 oz (130.8 kg)  BMI 42.56 kg/m2  SpO2 99%  LMP 04/16/2013  General Appearance:    Alert, cooperative, no distress, appears stated age, very sleepy   Head:    Normocephalic, without obvious abnormality, atraumatic  Eyes:    PERRL, conjunctiva/corneas clear, EOM's intact, fundi    benign, both eyes  Ears:    Normal TM's and external ear canals, both ears  Nose:   Nares normal, septum midline, mucosa normal, no drainage    or sinus tenderness  Throat:   Lips, mucosa, and tongue normal; teeth and gums normal  Neck:   Supple, symmetrical, trachea midline, no adenopathy;    thyroid:  no enlargement/tenderness/nodules; no carotid   bruit or JVD  Back:     Symmetric, no curvature, ROM normal, no CVA tenderness  Lungs:     Good air movement bilaterally , b/l rales+   Chest Wall:    No tenderness or deformity   Heart:    Regular rate and rhythm, S1 and  S2 normal, no murmur, rub   or gallop  Breast Exam:    No tenderness, masses, or nipple abnormality  Abdomen:     Soft, non-tender, bowel sounds active all four quadrants,    no masses, no organomegaly  Genitalia:    Normal female without lesion, discharge or tenderness  Rectal:    Normal tone, normal prostate, no masses or tenderness;   guaiac negative stool  Extremities:   Trace b/l LLE edema  Pulses:   2+ and symmetric all extremities  Skin:    Skin color, texture, turgor normal, no rashes or lesions  Lymph nodes:   Cervical, supraclavicular, and axillary nodes normal  Neurologic:   CNII-XII intact, normal strength, sensation and reflexes    throughout    Lab results: Basic Metabolic Panel:  Recent Labs  16/10/96 0050  NA 135  K 4.0  CL 95*  CO2 25  GLUCOSE 306*  BUN 10  CREATININE 0.80  CALCIUM 9.6   CBC:  Recent Labs  04/27/13 0050  WBC 12.5*  HGB 13.4  HCT 40.0  MCV 82.0  PLT 278   Cardiac Enzymes:  Recent Labs  04/27/13 0050  TROPONINI <0.30   BNP:  Recent Labs  04/27/13 0050  PROBNP 608.2*   Coagulation:  Recent Labs  04/27/13 0050  LABPROT 12.7  INR 0.96   Urine Drug Screen: Drugs of Abuse     Component Value Date/Time   LABOPIA NONE DETECTED 07/11/2010 0251   COCAINSCRNUR NONE DETECTED 07/11/2010 0251   LABBENZ NONE DETECTED 07/11/2010 0251   AMPHETMU NONE DETECTED 07/11/2010 0251   THCU NONE DETECTED 07/11/2010 0251   LABBARB  Value: NONE DETECTED        DRUG SCREEN FOR MEDICAL PURPOSES ONLY.  IF CONFIRMATION IS NEEDED FOR ANY PURPOSE, NOTIFY LAB WITHIN 5 DAYS.        LOWEST DETECTABLE LIMITS FOR URINE DRUG SCREEN Drug Class       Cutoff (ng/mL) Amphetamine      1000 Barbiturate      200 Benzodiazepine   200 Tricyclics       300 Opiates          300 Cocaine          300 THC              50 07/11/2010 0251    Urinalysis:  Recent Labs  04/27/13 0355  COLORURINE YELLOW  LABSPEC 1.039*  PHURINE 5.5  GLUCOSEU >1000*  HGBUR TRACE*  BILIRUBINUR NEGATIVE  KETONESUR NEGATIVE  PROTEINUR 100*  UROBILINOGEN 0.2  NITRITE NEGATIVE  LEUKOCYTESUR NEGATIVE     Imaging results:  Dg Chest Port 1 View  04/27/2013   *RADIOLOGY REPORT*  Clinical Data: Chest pain, shortness of breath.  PORTABLE CHEST - 1 VIEW  Comparison: 04/01/2013  Findings: Cardiomegaly with vascular congestion the.  No overt edema.  No confluent opacities or effusions.  No acute bony abnormality.  IMPRESSION:  Cardiomegaly, vascular congestion.   Original Report Authenticated By: Charlett Nose, M.D.    Other results: EKG: sinus tachycardia, HR-112 bpm, bordrline right axis deviation, no acute ST- T wave changes,  prolonged QT interval.  Assessment & Plan by Problem: Principal Problem:   CHF exacerbation Active Problems:   OSA (obstructive sleep apnea)   Type 2 diabetes mellitus, uncontrolled, with neuropathy   Depression   47 year old woman with PMH significant for poorly controlled DM, HTN, CHF, medication non - compliance presents to the ED with acute on  chronic worsening SOB and leg swelling over a period of 2 weeks.    #Acute on chronic combined systolic and diastolic CHF: ? 2/2 medication non- compliance given the history in the past.  Patient presents with SOB and leg swelling. She was moderately hypertensive , tachycardic, in moderate respiratory distress  on arrival to the ED. CXR shows pulmonary vascular congestion. Pro- BNP is mildly elevated to 608. She was started on nitroglycerine drip and was given 80 mg of IV lasix. She diuresed well with output of 2.5 litres so far. She was initially placed on SDU but with significant clinical improvement, she will be transferred to telemetry.  - Transfer to telemetry -  Cycle CE q6 - Titrate nitro gtt to off -  EKG PRN for chest pain  - Strict I and O's - Daily weights -  IV lasix 60 mg BID( patient takes 40 mg BID at home and was given 80 mg IV in the ED) - Continue metoprolol, spironolactone, ASA   # Leucocytosis: WBC is elevated to 12.5. No infiltrates on CXR. UA is clear. No obvious signs of infectious process.  This could be stress de margination vs bronchitis. - Observe off antibiotics for now - Trend CBC   # COPD: Appears stable.No active wheezing or rhonchi.  -Continue home inhaler  # HTN: BP stable - Continue home meds- clonidine, metoprolol , lasix and spirolactone.  # DM: Last AIC was 9.8 in 04/14. CBG 's are running high in  300's with the admission - Continue home dose of lantus and SSI. Would adjust insulin dose if needed based on CBG's.   # Depression: Stable. Hold celexa given prolonged QTc ( 518 ) on the EKG.   # Chronic pain: Holding tramadol for prolonged QTc. Continue methocarbamol for now. - May consider vicodin if she requests anything else for pain.  # DVT: Lovenox    Dispo: Disposition is deferred at this time, awaiting improvement of current medical problems. Anticipated discharge in approximately 2-3 day(s).   The patient does have a current PCP Marca Ancona, MD), therefore will be requiring OPC follow-up after discharge.   The patient does not have transportation limitations that hinder transportation to clinic appointments.  Signed: Quavis Klutz 04/27/2013, 7:33 AM

## 2013-04-27 NOTE — Progress Notes (Addendum)
Placed pt on NRB due to sats being in the 70's. Will continue to monitor.

## 2013-04-27 NOTE — ED Notes (Signed)
O2 removed per order Dr. Kristie Cowman sat decreased to 84%-NRB replaced at 100% with sat increased to 96%

## 2013-04-27 NOTE — Progress Notes (Signed)
Report called pt transferring [id number] via w/c with belongings. Notified dr Burtis Junes re troponin 0.32. Okay to transfer. Beryle Quant

## 2013-04-27 NOTE — H&P (Signed)
Internal Medicine Attending Admission Note Date: 04/27/2013  Patient name: [name] Medical record number: [id number] Date of birth: 1966-10-07 Age: 47 y.o. Gender: female  I saw and evaluated the patient. I reviewed the resident's note and I agree with the resident's findings and plan as documented in the resident's note.  Chief Complaint(s): Chest pain and shortness of breath  History - key components related to admission: Patient is a 47 year old female with past medical history most significant for poorly controlled diabetes, hypertension, obstructive sleep apnea, morbid obesity and chronic combined systolic and diastolic heart failure was under the care of about cardiology was admitted yesterday when she presented with acute onset of chest pain and shortness of breath. Patient told me that she was driving her car when she was stopped by a cop and was given a ticket. She immediately started having chest pain which was described as sharp pain on the left side of her chest, 8/10, nonradiating, associated with shortness of breath and palpitations, also associated with nausea but no vomiting. The pain was exacerbated with walking and was relieved with rest. Patient decided to come to ER at this point.  Patient had been noncompliant with her medications in the past but said that she has been taking all her medications since last 3 weeks. She denies any orthopnea, PND, swelling in her extremities, cough or fever.   15 point review of system is negative except what is noted above.  Patient is not very active and the most active thing that she does is to play with her grandkids. Patient's physical activity is limited by pain in her leg and foot. It is to be noted that patient's physical activity is not limited by chest pain or shortness of breath.   Past medical history, past surgical history, medications, family history and social history was reviewed and is as per resident's note.  Physical  Exam - key components related to admission:  Filed Vitals:   04/27/13 0804 04/27/13 0900 04/27/13 0958 04/27/13 1411  BP: 136/82  135/85 106/69  Pulse: 91 91 101 77  Temp: 97.9 F (36.6 C)  97.5 F (36.4 C) 97.9 F (36.6 C)  TempSrc: Oral  Oral Oral  Resp: 15 15 17 18   Height:   5\' 9"  (1.753 m)   Weight:   282 lb 14.4 oz (128.323 kg)   SpO2: 98% 95% 97% 96%  Physical Exam: General: Vital signs reviewed and noted. Well-developed, well-nourished, in no acute distress; alert, appropriate and cooperative throughout examination.  Head: Normocephalic, atraumatic.  Eyes: PERRL, EOMI, No signs of anemia or jaundince.  Nose: Mucous membranes moist, not inflammed, nonerythematous.  Throat: Oropharynx nonerythematous, no exudate appreciated.   Neck: No deformities, masses, or tenderness noted.Supple, No carotid Bruits, no JVD.  Lungs:  Normal respiratory effort. Clear to auscultation BL without crackles or wheezes.  Heart: RRR. S1 and S2 normal without gallop, murmur, or rubs.  Abdomen:   morbidly obese, abdomen distended secondary to fat, normal active bowel sounds . Soft, non-tender.  No masses or organomegaly.  Extremities: No pretibial edema.  Neurologic: A&O X3, CN II - XII are grossly intact. Motor strength is 5/5 in the all 4 extremities, Sensations intact to light touch, Cerebellar signs negative.  Skin: No visible rashes, scars.     Lab results:   Basic Metabolic Panel:  Recent Labs  24/40/10 0050 04/27/13 0820  NA 135  --   K 4.0  --   CL 95*  --  CO2 25  --   GLUCOSE 306*  --   BUN 10  --   CREATININE 0.80  --   CALCIUM 9.6  --   MG  --  1.5  PHOS  --  4.4   CBC:  Recent Labs  04/27/13 0050 04/27/13 0820  WBC 12.5* 11.8*  NEUTROABS  --  7.9*  HGB 13.4 12.4  HCT 40.0 37.0  MCV 82.0 82.0  PLT 278 243   Cardiac Enzymes:  Recent Labs  04/27/13 0050 04/27/13 0820 04/27/13 1310  TROPONINI <0.30 0.32* <0.30   CBG:  Recent Labs  04/27/13 0908  04/27/13 1121  GLUCAP 227* 216*   Coagulation:  Recent Labs  04/27/13 0050  INR 0.96    Imaging results:  Dg Chest Port 1 View  04/27/2013   *RADIOLOGY REPORT*  Clinical Data: Chest pain, shortness of breath.  PORTABLE CHEST - 1 VIEW  Comparison: 04/01/2013  Findings: Cardiomegaly with vascular congestion the.  No overt edema.  No confluent opacities or effusions.  No acute bony abnormality.  IMPRESSION: Cardiomegaly, vascular congestion.   Original Report Authenticated By: Charlett Nose, M.D.    2-D echocardiogram on December 2013 Study Conclusions - Left ventricle: Technically very limited study. Dysynergy of the septum. Hypokinesis of the inferior wall. The cavity size was normal. Wall thickness was increased in a pattern of moderate LVH. Systolic function was mildly to moderately reduced. The estimated ejection fraction was in the range of 40% to 45%. Doppler parameters are consistent with high ventricular filling pressure. - Pericardium, extracardiac: A trivial pericardial effusion was identified posterior to the heart. - Impressions: The images from August,2012 were also limited. I can not be sure that there is any significant difference. Impressions:  - The images from August,2012 were also limited. I can not be sure that there is any significant difference.   Other results: EKG: 112 beats per minute, sinus rhythm, right axis deviation, complete left bundle branch block, nonspecific ST or T wave changes noted in the lateral leads.  EKG this morning shows 91 beats per minute, complete left bundle branch block and T-wave inversions in lateral leads. Patient does meet the criteria for ventricular hypertrophy.   Patient's left bundle branch block seems to be new as compared to her previous EKG done on April 20th. Patient did have left bundle branch block present on previous EKGs specially when she was admitted in June 2012.   Assessment & Plan by Problem:  Principal  Problem:   CHF exacerbation Active Problems:   OSA (obstructive sleep apnea)   Type 2 diabetes mellitus, uncontrolled, with neuropathy   Depression  Patient is a 47 year old female with past medical history most significant for combined chronic systolic and congestive heart failure, uncontrolled hypertension and diabetes, morbid obesity and obstructive sleep apnea who was admitted for chest pain and shortness of breath last night consistent with acute exacerbation of her chronic congestive heart failure and also concerning for unstable angina.   1. acute on chronic combined systolic and diastolic congestive heart failure: Most likely cause of exacerbation could be dietary noncompliance, medication noncompliance or acute coronary event. Troponin was mildly positive at 0.32. Patient does have EKG changes with new left bundle branch block as compared to previous EKG. The patient was started on nitro drip which has now been stopped as patient does not have any chest pain anymore. Patient continues to be on aspirin, metoprolol and spironolactone. Patient was also started on IV Lasix to 60 mg bid.  2. unstable angina/non-ST elevation MI: Typical history in a patient with risk factors in the presence of EKG changes and mildly positive troponins is concerning for non-ST elevation MI. The troponins being positive to only 0.32 does not completely represent an MI. Patient is currently pain-free. Patient's echocardiogram from 6 months ago suggests inferior wall hypokinesis. Left heart catheterization done for new left bundle branch block in June 2012 showed no evidence of coronary artery disease. In the setting of medication noncompliance, the best possible treatment at this time would be optimization of medical therapy with aspirin, beta blockers, ACE inhibitors, nitrates and high-dose statins. She has not seen cardiology in about 12 months now and would benefit from an outpatient evaluation visit with them.  Patient may also be a candidate for a repeat echocardiogram after being on optimal medical therapy for 4-6 weeks. Patient may benefit from an outpatient stress test at the discretion of cardiology follow up.  Rest of the medical management as per resident's. I believe that patient can be discharged home tomorrow with followup appointment with cardiology and internal medicine clinic.  Lars Mage MD Faculty-Internal Medicine Residency Program

## 2013-04-27 NOTE — ED Provider Notes (Signed)
History     CSN: [id number]  Arrival date & time 04/27/13  0036   First MD Initiated Contact with Patient 04/27/13 0059      Chief Complaint  Patient presents with  . Chest Pain  . Shortness of Breath   HPI [name] is a 47 y.o. female history of poorly controlled hypertension, poorly controlled type 2 diabetes, obstructive sleep apnea, and chronic diastolic heart failure who presents with acute onset of shortness of breath. Patient says she was pulled over by the police earlier after which, (and receiving a ticket) patient started to have some shortness of breath, and productive cough.  She's also had some chest tightness in the center of her chest that is nonradiating, not associated with diaphoresis, nausea vomiting.  She arrives in respiratory distress   Past Medical History  Diagnosis Date  . Hypertension     Poorly controlled. Has had HTN since age 50. Angioedema with ACEI.  24 Hr urine and renal arterial dopplers ordered . . . Never done  . Type II or unspecified type diabetes mellitus without mention of complication, uncontrolled DX: 2002  . NICM (nonischemic cardiomyopathy)     EF 45-50% in 8/12, cath 6/12 showed normal coronaries, EF 50-55% by LV gram  . Morbid obesity   . Allergic rhinitis   . HLD (hyperlipidemia)   . OSA on CPAP     sleep study in 8/12 showed moderate to severe OSA requiring CPAP  . Chronic lower back pain     secondary to DJD, obsetiy, hip problems. Followed by Dr. Ivory Broad (pain management)  . 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 Chronic diastolic heart failure      Primarily diastolic CHF: Likely due to uncontrolled HTN. Last echo (8/12) with EF 45-50%, mild to moderate LVH with some asymmetric septal hypertrophy, RV normal size and systolic function. EF 50-55% by LV-gram in 6/12.   . Degeneration of lumbar or lumbosacral intervertebral disc   . Polyneuropathy in diabetes(357.2)   . Thoracic or lumbosacral neuritis  or radiculitis, unspecified   . Calcifying tendinitis of shoulder   . LBBB (left bundle branch block)   . Chronic combined systolic and diastolic CHF (congestive heart failure)     EF 40-45% by echo 12/06/2012  . Coronary artery disease     questionable. LHC 05/2011 showing normal coronaries // Followed at [facility], Dr. Shirlee Latch  . GERD (gastroesophageal reflux disease)     Past Surgical History  Procedure Laterality Date  . Breast biopsies Bilateral 2011    patient reports benign results  . Tubal ligation  05/31/1985  . Hand surgery Left   . Shoulder surgery Right   . Dental surgery      tumors removed   . Cardiac catheterization  05/2011    Family History  Problem Relation Age of Onset  . Heart disease Mother 59    Died of MI at age 45 yo  . Heart disease Paternal Grandmother     requiring pacemaker.  [name] Heart disease Paternal Grandfather 50    Died of MI at possibly age 78-53yo  . Stroke Paternal Grandfather   . Heart disease Father 23    MI age 70yo requiring stenting  . Kidney disease Mother     requiring dialysis  . Congestive Heart Failure Mother   . Diabetes Father   . Diabetes Brother   . Heart disease Brother 57    MI  at age 33 years old  . Breast cancer Paternal Aunt   . Breast cancer Maternal Grandmother     History  Substance Use Topics  . Smoking status: Never Smoker   . Smokeless tobacco: Never Used  . Alcohol Use: No    OB History   Grav Para Term Preterm Abortions TAB SAB Ect Mult Living                  Review of Systems At least 10pt or greater review of systems completed and are negative except where specified in the HPI.  Allergies  Clindamycin; Enalapril maleate; and Lisinopril  Home Medications   Current Outpatient Rx  Name  Route  Sig  Dispense  Refill  . albuterol (VENTOLIN HFA) 108 (90 BASE) MCG/ACT inhaler   Inhalation   Inhale 2 puffs into the lungs every 6 (six) hours as needed. For wheezing         . aspirin  EC 81 MG tablet   Oral   Take 1 tablet (81 mg total) by mouth daily.         . Blood Glucose Monitoring Suppl (ACCU-CHEK AVIVA PLUS) W/DEVICE KIT   Does not apply   1 kit by Does not apply route once.   1 kit   0     DX: 250.02. Check blood sugars 3 times daily   . carbamazepine (TEGRETOL) 200 MG tablet   Oral   Take 1 tablet (200 mg total) by mouth 3 (three) times daily. Take 1 pill in AM and 2 pills in PM   90 tablet   4   . ciprofloxacin (CIPRO) 250 MG tablet   Oral   Take 1 tablet (250 mg total) by mouth 2 (two) times daily. ANTIBIOTIC   6 tablet   0   . citalopram (CELEXA) 20 MG tablet   Oral   Take 1 tablet (20 mg total) by mouth daily.   30 tablet   0   . cloNIDine (CATAPRES) 0.2 MG tablet   Oral   Take 1 tablet (0.2 mg total) by mouth 2 (two) times daily.   60 tablet   0   . diclofenac sodium (VOLTAREN) 1 % GEL   Topical   Apply 2 g topically 4 (four) times daily as needed (for pain).         . furosemide (LASIX) 40 MG tablet   Oral   Take 1 tablet (40 mg total) by mouth 2 (two) times daily.   90 tablet   0   . glucose blood (ACCU-CHEK AVIVA) test strip      Use as instructed   100 each   12     DX; 250.02 check blood sugars 3 times daily before ...   . ibuprofen (ADVIL,MOTRIN) 200 MG tablet   Oral   Take 600 mg by mouth every 6 (six) hours as needed for pain.         [name] insulin aspart (NOVOLOG FLEXPEN) 100 UNIT/ML injection   Subcutaneous   Inject 7 Units into the skin 3 (three) times daily before meals.   1 vial   1   . insulin glargine (LANTUS SOLOSTAR) 100 UNIT/ML injection   Subcutaneous   Inject 0.2 mLs (20 Units total) into the skin at bedtime. DX: 250.02. Will need 3 pens/month   3 pen   1   . Insulin Pen Needle (NOVOFINE) 30G X 8 MM MISC   Subcutaneous   Inject 10 each into  the skin as needed.   100 each   0   . Lancet Devices (ACCU-CHEK SOFTCLIX) lancets      DX; 250.02 check blood sugars 3 times daily before meals    1 each   0     Box of 100 to be dispensed   . methocarbamol (ROBAXIN) 500 MG tablet   Oral   Take 1 tablet (500 mg total) by mouth every 12 (twelve) hours. For muscle spasm   30 tablet   0   . metoprolol succinate (TOPROL-XL) 100 MG 24 hr tablet   Oral   Take 1 tablet (100 mg total) by mouth daily. Take with or immediately following a meal.   30 tablet   0   . oxyCODONE-acetaminophen (PERCOCET) 10-325 MG per tablet   Oral   Take 1 tablet by mouth every 8 (eight) hours as needed. For pain   60 tablet   0     Must last 30 days.   . pantoprazole (PROTONIX) 40 MG tablet   Oral   Take 1 tablet (40 mg total) by mouth daily.   30 tablet   0   . polyethylene glycol (MIRALAX / GLYCOLAX) packet   Oral   Take 17 g by mouth daily.   14 each   0   . potassium chloride (K-DUR) 10 MEQ tablet   Oral   Take 1 tablet (10 mEq total) by mouth daily.   30 tablet   1   . promethazine (PHENERGAN) 25 MG tablet   Oral   Take 1 tablet (25 mg total) by mouth every 6 (six) hours as needed for nausea.   15 tablet   0   . spironolactone (ALDACTONE) 25 MG tablet   Oral   Take 1 tablet (25 mg total) by mouth daily.   30 tablet   1   . traMADol (ULTRAM) 50 MG tablet   Oral   Take 1 tablet (50 mg total) by mouth every 6 (six) hours as needed for pain.   20 tablet   0     BP 130/91  Pulse 121  Temp(Src) 97.7 F (36.5 C) (Oral)  Resp 28  SpO2 78%  LMP 04/16/2013  Physical Exam  Nursing notes reviewed.  Electronic medical record reviewed. VITAL SIGNS:   Filed Vitals:   04/27/13 0043  BP: 130/91  Pulse: 121  Temp: 97.7 F (36.5 C)  TempSrc: Oral  Resp: 28  SpO2: 78%   CONSTITUTIONAL: Awake, oriented, appears non-toxic, mild respiratory distress- can complete full sentences HENT: Atraumatic, normocephalic, oral mucosa pink and moist, airway patent. Nares patent without drainage. External ears normal. EYES: Conjunctiva clear, EOMI, PERRLA NECK: Trachea midline,  non-tender, supple CARDIOVASCULAR: Normal heart rate, Normal rhythm, No murmurs, rubs, gallops PULMONARY/CHEST: Rales throughout, worse in the base. Symmetrical breath sounds. Non-tender. ABDOMINAL: Non-distended, morbidly obese, soft, non-tender - no rebound or guarding.  BS normal. NEUROLOGIC: Non-focal, moving all four extremities, no gross sensory or motor deficits. EXTREMITIES: No clubbing, cyanosis, or edema SKIN: Warm, Dry, No erythema, No rash  ED Course  Procedures (including critical care time)  Date: 04/27/2013  Rate: 112  Rhythm: Sinus tachycardia  QRS Axis: Borderline right axis deviation  Intervals: normal  ST/T Wave abnormalities: normal  Conduction Disutrbances: Nonspecific intraventricular block  Narrative Interpretation: Borderline right axis deviation, conduction delay appears new, sinus tachycardia is faster rate than previous EKG from 04/01/2013. Nonischemic EKG  CRITICAL CARE Performed by: Jones Skene Total critical care time: 30  Critical care time was exclusive of separately billable procedures and treating other patients. Critical care was necessary to treat or prevent imminent or life-threatening deterioration secondary to hypoxia, shortness of breath, respiratory distress. Critical care was time spent personally by me on the following activities: development of treatment plan with patient and/or surrogate as well as nursing, discussions with consultants, evaluation of patient's response to treatment, examination of patient, obtaining history from patient or surrogate, ordering and performing treatments and interventions, ordering and review of laboratory studies, ordering and review of radiographic studies, pulse oximetry and re-evaluation of patient's condition.  Labs Reviewed  CBC - Abnormal; Notable for the following:    WBC 12.5 (*)    All other components within normal limits  BASIC METABOLIC PANEL - Abnormal; Notable for the following:    Chloride 95  (*)    Glucose, Bld 306 (*)    GFR calc non Af Amer 87 (*)    All other components within normal limits  PRO B NATRIURETIC PEPTIDE - Abnormal; Notable for the following:    Pro B Natriuretic peptide (BNP) 608.2 (*)    All other components within normal limits  URINALYSIS, ROUTINE W REFLEX MICROSCOPIC - Abnormal; Notable for the following:    APPearance CLOUDY (*)    Specific Gravity, Urine 1.039 (*)    Glucose, UA >1000 (*)    Hgb urine dipstick TRACE (*)    Protein, ur 100 (*)    All other components within normal limits  URINE MICROSCOPIC-ADD ON - Abnormal; Notable for the following:    Squamous Epithelial / LPF FEW (*)    Bacteria, UA FEW (*)    All other components within normal limits  URINE CULTURE  TROPONIN I  PROTIME-INR   Dg Chest Port 1 View  04/27/2013   *RADIOLOGY REPORT*  Clinical Data: Chest pain, shortness of breath.  PORTABLE CHEST - 1 VIEW  Comparison: 04/01/2013  Findings: Cardiomegaly with vascular congestion the.  No overt edema.  No confluent opacities or effusions.  No acute bony abnormality.  IMPRESSION: Cardiomegaly, vascular congestion.   Original Report Authenticated By: Charlett Nose, M.D.     1. Dyspnea   2. Chronic combined systolic and diastolic CHF (congestive heart failure)   3. Chest pain     Medications  nitroGLYCERIN (NITROSTAT) SL tablet 0.4 mg (0.4 mg Sublingual Given 04/27/13 0120)  aspirin tablet 325 mg (325 mg Oral Given 04/27/13 0424)  nitroGLYCERIN 0.2 mg/mL in dextrose 5 % infusion (50 mcg/min Intravenous New Bag/Given 04/27/13 0124)  furosemide (LASIX) injection 80 mg (80 mg Intravenous Given 04/27/13 0424)   Medications  nitroGLYCERIN (NITROSTAT) SL tablet 0.4 mg (0.4 mg Sublingual Given 04/27/13 0120)  aspirin tablet 325 mg (325 mg Oral Given 04/27/13 0424)  nitroGLYCERIN 0.2 mg/mL in dextrose 5 % infusion (50 mcg/min Intravenous New Bag/Given 04/27/13 0124)  furosemide (LASIX) injection 80 mg (80 mg Intravenous Given 04/27/13 0424)      MDM  CIGI BEGA is a 47 y.o. female presents with chest pain shortness of breath after being pulled over by the police, patient likely had an adrenergic surge, possibly contributing to her acute pulmonary edema, minimal peripheral edema at this time however physical exam reveals rales throughout the lungs, chest x-ray shows cardiomegaly and vascular congestion without overt pulmonary edema. Patient is tachycardic, tachypneic and saturating 78% on room air.  IV nitroglycerin, 100% O2 applied for preload reduction to prevent further deterioration and respiratory distress.  Patient is breathing easier after preload  reduction however patient remains hypoxic off oxygen.  Titrated the blood pressure with nitroglycerin lungs sound better.,  Discussed with Dr. Julian Reil from [facility], patient is actually a new patient of internal medicine teaching service and will be transferred to [facility] for continued care under their service.            Jones Skene, MD 04/27/13 [phone]

## 2013-04-28 DIAGNOSIS — E669 Obesity, unspecified: Secondary | ICD-10-CM

## 2013-04-28 LAB — GLUCOSE, CAPILLARY
Glucose-Capillary: 248 mg/dL — ABNORMAL HIGH (ref 70–99)
Glucose-Capillary: 277 mg/dL — ABNORMAL HIGH (ref 70–99)
Glucose-Capillary: 260 mg/dL — ABNORMAL HIGH (ref 70–99)
Glucose-Capillary: 226 mg/dL — ABNORMAL HIGH (ref 70–99)

## 2013-04-28 LAB — BASIC METABOLIC PANEL
BUN: 13 mg/dL (ref 6–23)
BUN: 12 mg/dL (ref 6–23)
CO2: 26 mEq/L (ref 19–32)
Calcium: 9 mg/dL (ref 8.4–10.5)
Chloride: 95 mEq/L — ABNORMAL LOW (ref 96–112)
Chloride: 94 mEq/L — ABNORMAL LOW (ref 96–112)
Creatinine, Ser: 0.79 mg/dL (ref 0.50–1.10)
GFR calc Af Amer: 89 mL/min — ABNORMAL LOW (ref >90)
GFR calc Af Amer: >90 mL/min (ref >90)
GFR calc Af Amer: >90 mL/min (ref >90)
GFR calc non Af Amer: 77 mL/min — ABNORMAL LOW (ref >90)
GFR calc non Af Amer: >90 mL/min (ref >90)
GFR calc non Af Amer: 79 mL/min — ABNORMAL LOW (ref >90)
Potassium: 3.6 mEq/L (ref 3.5–5.1)
Potassium: 3.7 mEq/L (ref 3.5–5.1)
Sodium: 136 mEq/L (ref 135–145)
Sodium: 135 mEq/L (ref 135–145)
Sodium: 133 mEq/L — ABNORMAL LOW (ref 135–145)

## 2013-04-28 MED ORDER — INSULIN GLARGINE 100 UNIT/ML ~~LOC~~ SOLN
25.0000 [IU] | Freq: Every day | SUBCUTANEOUS | Status: DC
  Administered 2013-04-28 – 2013-04-29 (×2): 25 [IU] via SUBCUTANEOUS
  Filled 2013-04-28 (×3): qty 0.25

## 2013-04-28 MED ORDER — FUROSEMIDE 40 MG PO TABS
40.0000 mg | ORAL_TABLET | Freq: Two times a day (BID) | ORAL | Status: DC
  Administered 2013-04-28 – 2013-04-29 (×2): 40 mg via ORAL
  Filled 2013-04-28 (×4): qty 1

## 2013-04-28 MED ORDER — SIMVASTATIN 40 MG PO TABS
40.0000 mg | ORAL_TABLET | Freq: Every day | ORAL | Status: DC
  Administered 2013-04-28 – 2013-04-29 (×2): 40 mg via ORAL
  Filled 2013-04-28 (×3): qty 1

## 2013-04-28 NOTE — Treatment Plan (Signed)
Called by RN about pt BP 86/52, P 76. On recheck in 30 minutes, has improved to 109/68 without intervention, Pulse remains in 70s. Afebrile. Nurse reports that patient is asymptomatic except for her baseline fatigue present on admission. She wants to take a shower.   A/P: 47 yo female admitted for CHF exacerbation with good diuresis response and now transient hypotension. No sign of infection, coronary syndrome. Patient has undergone diuresis for CHF exacerbation and is negative 5 lbs and 3.5L in past 2 days. There is no fever, there is a downtrending WBC and overall patient is improved with plans for DC tomorrow. Seems she has restarted her home doses or HTN regimen, no new meds this admission.   Will hold clonidine and pm dose of lasix for tonight to avoid worsening or creating any orthostatic symptoms. Advise pt to be careful with standing/ambulation and fall precaution while showering. Monitor closely for clinical change and notify MD.

## 2013-04-28 NOTE — Progress Notes (Signed)
Informed IM resident regarding bp of 86/52 hr 76.  Instructed to obtain bp again in 30 minutes.  Pt asymptomatic, afebrile.

## 2013-04-28 NOTE — Progress Notes (Signed)
INTERNAL MEDICINE TEACHING SERVICE Attending Note  Date: 04/28/2013  Patient name: [name]  Medical record number: [id number]  Date of birth: 15-Jan-1966   Feels better today. Admits her SOB and CP is better. She has not ambulated much in her room.   Filed Vitals:   04/28/13 0925  BP: 112/72  Pulse: 81  Temp:   Resp:   GEN: AAOx3, NAD, obese. HEENT: EOMI, PERRL. CV: S1S2, RRR, no m/r/g, no JVD. PULM: CTA bilat. ABD/GI: obese, NT, +BS, no guarding. LE: trace pitting edema, 2/4 pulses.  A/P 46 yr. Old AAF w/ chronic systolic/diastolic CHF, HTN, DM, morbid obesity, OSA, admitted with Acute on Chronic systolic/diastolic CHF. -She is -1031 cc since admission. Improved.  Change lasix to 40 mg PO bid. Follow electrolytes.  -Elevation of Trop is likely demand ischemia, these have trended down. She can be followed up as an outpatient with cardiology. She is metoprolol, ASA, but unfortunately cannot tolerate ACE or ARB.  Start moderate intensity statin, simvastatin 40 mg. -her DM is uncontrolled.  Increase lantus to 25 units twice a day. -Likely D/C iin AM if continues to improve. Jonah Blue       The treatment plan was discussed in detail with the patient.  Alternatives to treatment, side effects, risks and benefits, and complications were discussed with the patient. Informed consent was obtained. The patient agrees to proceed with the current treatment plan.  Jonah Blue, DO  04/28/2013, 10:44 AM

## 2013-04-29 DIAGNOSIS — E1142 Type 2 diabetes mellitus with diabetic polyneuropathy: Secondary | ICD-10-CM

## 2013-04-29 DIAGNOSIS — R079 Chest pain, unspecified: Secondary | ICD-10-CM

## 2013-04-29 DIAGNOSIS — I10 Essential (primary) hypertension: Secondary | ICD-10-CM

## 2013-04-29 DIAGNOSIS — I5042 Chronic combined systolic (congestive) and diastolic (congestive) heart failure: Secondary | ICD-10-CM

## 2013-04-29 DIAGNOSIS — E785 Hyperlipidemia, unspecified: Secondary | ICD-10-CM

## 2013-04-29 DIAGNOSIS — I509 Heart failure, unspecified: Secondary | ICD-10-CM

## 2013-04-29 DIAGNOSIS — M47817 Spondylosis without myelopathy or radiculopathy, lumbosacral region: Secondary | ICD-10-CM

## 2013-04-29 DIAGNOSIS — R0609 Other forms of dyspnea: Secondary | ICD-10-CM

## 2013-04-29 DIAGNOSIS — F329 Major depressive disorder, single episode, unspecified: Secondary | ICD-10-CM

## 2013-04-29 DIAGNOSIS — G4733 Obstructive sleep apnea (adult) (pediatric): Secondary | ICD-10-CM

## 2013-04-29 DIAGNOSIS — E1149 Type 2 diabetes mellitus with other diabetic neurological complication: Secondary | ICD-10-CM

## 2013-04-29 DIAGNOSIS — R0989 Other specified symptoms and signs involving the circulatory and respiratory systems: Secondary | ICD-10-CM

## 2013-04-29 DIAGNOSIS — I5032 Chronic diastolic (congestive) heart failure: Secondary | ICD-10-CM

## 2013-04-29 LAB — URINE CULTURE: Colony Count: >=100000

## 2013-04-29 LAB — GLUCOSE, CAPILLARY
Glucose-Capillary: 247 mg/dL — ABNORMAL HIGH (ref 70–99)
Glucose-Capillary: 219 mg/dL — ABNORMAL HIGH (ref 70–99)
Glucose-Capillary: 190 mg/dL — ABNORMAL HIGH (ref 70–99)
Glucose-Capillary: 295 mg/dL — ABNORMAL HIGH (ref 70–99)

## 2013-04-29 NOTE — Progress Notes (Signed)
Resp Care Note;Pt not ready to put CPAP on at this time,Says she will put it on herself when ready.

## 2013-04-29 NOTE — Progress Notes (Signed)
Patient placed on CPAP machine using a nasal mask with 2L Oxygen bled in.  RT will continue to monitor.

## 2013-04-29 NOTE — Progress Notes (Signed)
Subjective: Patient states that she felt weak, slightly dizzy and nauseated this morning when she stood up. No other c/o. No SOB or chest pain.   Orthostatic VS showed Lying BP 141/61 HR 79 Sitting BP 136/84, HR 89 Standing 132/88, HR 93.   Objective: Vital signs in last 24 hours: Filed Vitals:   04/29/13 1546 04/29/13 1548 04/29/13 1551 04/29/13 1553  BP: 116/88 118/87 68/44 129/90  Pulse: 81 83 112 87  Temp:      TempSrc:      Resp:      Height:      Weight:      SpO2: 98%   98%   Weight change: -2 lb 14.7 oz (-1.323 kg)  Intake/Output Summary (Last 24 hours) at 04/29/13 1800 Last data filed at 04/29/13 1638  Gross per 24 hour  Intake    800 ml  Output   2250 ml  Net  -1450 ml   Physical exam General: NAD Lungs: CTA B/L Heart: RRR. No M/G/R Abd: Soft, non tenderness, no rebound tenderness, BS active x 4 Lab Results: Basic Metabolic Panel:  Recent Labs Lab 04/27/13 0820 04/28/13 0430 04/28/13 0900 04/28/13 1400  Ivo Moga  --  136 135 133*  K  --  3.6 3.9 3.7  CL  --  95* 97 94*  CO2  --  25 26 26   GLUCOSE  --  266* 243* 205*  BUN  --  13 13 12   CREATININE  --  0.89 0.79 0.87  CALCIUM  --  9.1 9.0 9.0  MG 1.5 1.8  --   --   PHOS 4.4  --   --   --    CBC:  Recent Labs Lab 04/27/13 0050 04/27/13 0820  WBC 12.5* 11.8*  NEUTROABS  --  7.9*  HGB 13.4 12.4  HCT 40.0 37.0  MCV 82.0 82.0  PLT 278 243   Cardiac Enzymes:  Recent Labs Lab 04/27/13 0820 04/27/13 1310 04/27/13 2033  TROPONINI 0.32* <0.30 <0.30   BNP:  Recent Labs Lab 04/27/13 0050  PROBNP 608.2*   CBG:  Recent Labs Lab 04/28/13 1555 04/28/13 2116 04/29/13 0631 04/29/13 0932 04/29/13 1129 04/29/13 1637  GLUCAP 171* 226* 247* 247* 219* 190*    Coagulation:  Recent Labs Lab 04/27/13 0050  LABPROT 12.7  INR 0.96  Urine Drug Screen: Drugs of Abuse     Component Value Date/Time   LABOPIA NONE DETECTED 07/11/2010 0251   COCAINSCRNUR NONE DETECTED 07/11/2010 0251   LABBENZ NONE DETECTED 07/11/2010 0251   AMPHETMU NONE DETECTED 07/11/2010 0251   THCU NONE DETECTED 07/11/2010 0251   LABBARB  Value: NONE DETECTED        DRUG SCREEN FOR MEDICAL PURPOSES ONLY.  IF CONFIRMATION IS NEEDED FOR ANY PURPOSE, NOTIFY LAB WITHIN 5 DAYS.        LOWEST DETECTABLE LIMITS FOR URINE DRUG SCREEN Drug Class       Cutoff (ng/mL) Amphetamine      1000 Barbiturate      200 Benzodiazepine   200 Tricyclics       300 Opiates          300 Cocaine          300 THC              50 07/11/2010 0251    Urinalysis:  Recent Labs Lab 04/27/13 0355  COLORURINE YELLOW  LABSPEC 1.039*  PHURINE 5.5  GLUCOSEU >1000*  HGBUR TRACE*  BILIRUBINUR NEGATIVE  Lavenia Atlas  NEGATIVE  PROTEINUR 100*  UROBILINOGEN 0.2  NITRITE NEGATIVE  LEUKOCYTESUR NEGATIVE    Micro Results: Recent Results (from the past 240 hour(s))  URINE CULTURE     Status: None   Collection Time    04/27/13  3:55 AM      Result Value Range Status   Specimen Description URINE, CLEAN CATCH   Final   Special Requests NONE   Final   Culture  Setup Time 04/27/2013 09:59   Final   Colony Count >=100,000 COLONIES/ML   Final   Culture ESCHERICHIA COLI   Final   Report Status PENDING   Incomplete  MRSA PCR SCREENING     Status: None   Collection Time    04/27/13  6:46 AM      Result Value Range Status   MRSA by PCR NEGATIVE  NEGATIVE Final   Comment:            The GeneXpert MRSA Assay (FDA     approved for NASAL specimens     only), is one component of a     comprehensive MRSA colonization     surveillance program. It is not     intended to diagnose MRSA     infection nor to guide or     monitor treatment for     MRSA infections.   Studies/Results: No results found. Medications: I have reviewed the patient's current medications. Scheduled Meds: . aspirin EC  81 mg Oral q morning - 10a  . carbamazepine  200 mg Oral Daily  . carbamazepine  400 mg Oral QHS  . heparin  5,000 Units Subcutaneous Q8H  . insulin aspart   0-9 Units Subcutaneous TID WC  . insulin glargine  25 Units Subcutaneous QHS  . pantoprazole  40 mg Oral Daily  . simvastatin  40 mg Oral q1800  . sodium chloride  3 mL Intravenous Q12H  . sodium chloride  3 mL Intravenous Q12H   Continuous Infusions:  PRN Meds:.sodium chloride, acetaminophen, acetaminophen, albuterol, diclofenac sodium, methocarbamol, polyethylene glycol, sodium chloride Assessment/Plan:  #Acute on chronic combined systolic and diastolic CHF:   Resolved. NYHA I-II, Off Nitro gtt since 04/27/13 Neg net ~ 4L. Weight down 8 lbs since admission--I think that she is a little on her dry side.  Last ECHO in Dec 2014 showed EF 40-45% with moderate LVH.   - daily weight and strict I+O - mild elevated CE and trended down most likely represents demand ischemia. - Diuretic therapy deescalate to home dose lasix since yesterday (04/28/13) - on home ASA, BB and statin - ACEI allergy-- Do not need nitrates/hydralazine combo yet given her EF and NYHA.   # Orthostatic hypotension Noted on 04/28/13--lasix and clonidine pm dose held. Orthostatic resolved this am and reoccurred this afternoon. The etiology is likely associated with the diuresis. I think that she is rather on her dry side.   Lying BP 102/70, HR 81 Sitting BP 116/88, HR 81 Standing BP 68/44 HR 112   --Holding PM lasix, clonidine --D/C am antihypertensive medication for now - orthostatic in am  - With holding above meds and ask her to drink more liberally, I hops this problem improves tomorrow. I would not give her IVF now.   # Leucocytosis: better  # COPD: Appears stable .  # DM: Last AIC was 9.8 in 04/14. Hx of medical noncompliance. Lantus 20 units was restarted on 04/09/13 --lantus titrate up to 25  --SSi  # Depression: Stable. Hold celexa given  prolonged QTc ( 518 ) on the EKG.  - will repeat EKG in am  # Chronic pain: Holding tramadol for prolonged QTc. Continue methocarbamol for now.  - May consider vicodin  if she requests anything else for pain.   # DVT: Lovenox       Dispo: Disposition is deferred at this time, awaiting improvement of current medical problems.  Anticipated discharge in approximately 1 day(s).   The patient does not have a current PCP (No primary provider on file.), therefore is requiring OPC follow-up after discharge.   The patient does not have transportation limitations that hinder transportation to clinic appointments.  .Services Needed at time of discharge: Y = Yes, Blank = No PT:   OT:   RN:   Equipment:   Other:     LOS: 2 days   Leily Capek 04/29/2013, 6:00 PM

## 2013-04-29 NOTE — Plan of Care (Signed)
Problem: Consults Goal: Heart Failure Patient Education (See Patient Education module for education specifics.) Outcome: Progressing Pt says she received HF teaching book a couple of months ago. Needs refresher information

## 2013-04-29 NOTE — Progress Notes (Signed)
Pt called said she felt clammy and a little weak and nauseated. Nausea passed quickly. VS done and stable see doc flow sheet. CBG checked results 243. O2 sat 98 % . Sitting at edge of bed. Placed back in bed with legs elevated.

## 2013-04-29 NOTE — Progress Notes (Signed)
SATURATION QUALIFICATIONS: (This note is used to comply with regulatory documentation for home oxygen)  Patient Saturations on Room Air at Rest = 98%  Patient Saturations on Room Air while Ambulating = 97%  Patient Saturations on 0 Liters of oxygen while Ambulating = 97%  Please briefly explain why patient needs home oxygen: 

## 2013-04-29 NOTE — Plan of Care (Signed)
Problem: Problem: Diabetes Management Progression Goal: INCREASE DIABETES KNOWLEDGE Outcome: Completed/Met Date Met:  04/29/13 Given exit care notes on 1800 cal ada diet, how to eat out, diabetes and exercise, reading labels, meal planning guide.

## 2013-04-30 DIAGNOSIS — M1710 Unilateral primary osteoarthritis, unspecified knee: Secondary | ICD-10-CM

## 2013-04-30 LAB — BASIC METABOLIC PANEL
Calcium: 9.3 mg/dL (ref 8.4–10.5)
GFR calc Af Amer: 84 mL/min — ABNORMAL LOW (ref >90)
GFR calc non Af Amer: 73 mL/min — ABNORMAL LOW (ref >90)
Glucose, Bld: 224 mg/dL — ABNORMAL HIGH (ref 70–99)
Potassium: 3.9 mEq/L (ref 3.5–5.1)
Sodium: 136 mEq/L (ref 135–145)

## 2013-04-30 LAB — MAGNESIUM: Magnesium: 1.9 mg/dL (ref 1.5–2.5)

## 2013-04-30 MED ORDER — SIMVASTATIN 40 MG PO TABS: 40.0000 mg | ORAL_TABLET | Freq: Every day | ORAL | Status: DC

## 2013-04-30 NOTE — Plan of Care (Signed)
Problem: Phase I Progression Outcomes Goal: EF % per last Echo/documented,Core Reminder form on chart Outcome: Completed/Met Date Met:  04/30/13 12/06/12 EF 40 to 45 %

## 2013-04-30 NOTE — Progress Notes (Signed)
Subjective: Patient states that dizziness has resolved and pt is ready to go home this AM. No repeat CP, SOB, or HA. Pt VSS overnight and no acute events. Pt is at baseline weight this AM.   Objective: Vital signs in last 24 hours: Filed Vitals:   04/30/13 0603 04/30/13 0604 04/30/13 0605 04/30/13 0606  BP: 123/81 121/84 140/91 130/97  Pulse: 89 88 102 95  Temp: 98.4 F (36.9 C)     TempSrc: Oral     Resp: 20 22 22 22   Height:      Weight:  279 lb 8.7 oz (126.8 kg)    SpO2: 99% 99% 100% 99%   Weight change: -7.1 oz (-0.2 kg)  Intake/Output Summary (Last 24 hours) at 04/30/13 1007 Last data filed at 04/30/13 0848  Gross per 24 hour  Intake   1083 ml  Output   2200 ml  Net  -1117 ml   Physical exam General: NAD Lungs: CTA B/L Heart: RRR. No M/G/R Abd: Soft, non tenderness, no rebound tenderness, BS active x 4 Lab Results: Basic Metabolic Panel:  Recent Labs Lab 04/27/13 0820 04/28/13 0430  04/28/13 1400 04/30/13 0500  NA  --  136  < > 133* 136  K  --  3.6  < > 3.7 3.9  CL  --  95*  < > 94* 96  CO2  --  25  < > 26 31  GLUCOSE  --  266*  < > 205* 224*  BUN  --  13  < > 12 11  CREATININE  --  0.89  < > 0.87 0.93  CALCIUM  --  9.1  < > 9.0 9.3  MG 1.5 1.8  --   --  1.9  PHOS 4.4  --   --   --   --   < > = values in this interval not displayed. CBC:  Recent Labs Lab 04/27/13 0050 04/27/13 0820  WBC 12.5* 11.8*  NEUTROABS  --  7.9*  HGB 13.4 12.4  HCT 40.0 37.0  MCV 82.0 82.0  PLT 278 243   Cardiac Enzymes:  Recent Labs Lab 04/27/13 0820 04/27/13 1310 04/27/13 2033  TROPONINI 0.32* <0.30 <0.30   BNP:  Recent Labs Lab 04/27/13 0050  PROBNP 608.2*   CBG:  Recent Labs Lab 04/29/13 0932 04/29/13 1129 04/29/13 1637 04/29/13 2056 04/30/13 0549 04/30/13 0615  GLUCAP 247* 219* 190* 295* 207* 227*    Coagulation:  Recent Labs Lab 04/27/13 0050  LABPROT 12.7  INR 0.96  Urine Drug Screen: Drugs of Abuse     Component Value Date/Time    LABOPIA NONE DETECTED 07/11/2010 0251   COCAINSCRNUR NONE DETECTED 07/11/2010 0251   LABBENZ NONE DETECTED 07/11/2010 0251   AMPHETMU NONE DETECTED 07/11/2010 0251   THCU NONE DETECTED 07/11/2010 0251   LABBARB  Value: NONE DETECTED        DRUG SCREEN FOR MEDICAL PURPOSES ONLY.  IF CONFIRMATION IS NEEDED FOR ANY PURPOSE, NOTIFY LAB WITHIN 5 DAYS.        LOWEST DETECTABLE LIMITS FOR URINE DRUG SCREEN Drug Class       Cutoff (ng/mL) Amphetamine      1000 Barbiturate      200 Benzodiazepine   200 Tricyclics       300 Opiates          300 Cocaine          300 THC  50 07/11/2010 0251    Urinalysis:  Recent Labs Lab 04/27/13 0355  COLORURINE YELLOW  LABSPEC 1.039*  PHURINE 5.5  GLUCOSEU >1000*  HGBUR TRACE*  BILIRUBINUR NEGATIVE  KETONESUR NEGATIVE  PROTEINUR 100*  UROBILINOGEN 0.2  NITRITE NEGATIVE  LEUKOCYTESUR NEGATIVE    Micro Results: Recent Results (from the past 240 hour(s))  URINE CULTURE     Status: None   Collection Time    04/27/13  3:55 AM      Result Value Range Status   Specimen Description URINE, CLEAN CATCH   Final   Special Requests NONE   Final   Culture  Setup Time 04/27/2013 09:59   Final   Colony Count >=100,000 COLONIES/ML   Final   Culture     Final   Value: ESCHERICHIA COLI     Note: Confirmed Extended Spectrum Beta-Lactamase Producer (ESBL)   Report Status 04/29/2013 FINAL   Final   Organism ID, Bacteria ESCHERICHIA COLI   Final  MRSA PCR SCREENING     Status: None   Collection Time    04/27/13  6:46 AM      Result Value Range Status   MRSA by PCR NEGATIVE  NEGATIVE Final   Comment:            The GeneXpert MRSA Assay (FDA     approved for NASAL specimens     only), is one component of a     comprehensive MRSA colonization     surveillance program. It is not     intended to diagnose MRSA     infection nor to guide or     monitor treatment for     MRSA infections.   Studies/Results: No results found. Medications: I have reviewed the  patient's current medications. Scheduled Meds: . aspirin EC  81 mg Oral q morning - 10a  . carbamazepine  200 mg Oral Daily  . carbamazepine  400 mg Oral QHS  . heparin  5,000 Units Subcutaneous Q8H  . insulin aspart  0-9 Units Subcutaneous TID WC  . insulin glargine  25 Units Subcutaneous QHS  . pantoprazole  40 mg Oral Daily  . simvastatin  40 mg Oral q1800  . sodium chloride  3 mL Intravenous Q12H  . sodium chloride  3 mL Intravenous Q12H   Continuous Infusions:  PRN Meds:.sodium chloride, acetaminophen, acetaminophen, albuterol, diclofenac sodium, methocarbamol, polyethylene glycol, sodium chloride Assessment/Plan:  #Acute on chronic combined systolic and diastolic CHF-resolved:   NYHA I-II, Off Nitro gtt since 04/27/13. Neg net ~ 5L. Weight down 8 lbs since admission. Therefore believe pt has been more aggressively diuresed given orthostasis. Last ECHO in Dec 2014 showed EF 40-45% with moderate LVH.   - daily weight and strict I+O - pt tolerated and responded well to home dose diuretics - on home ASA, BB and statin  # Orthostatic hypotension- Resolved.  Noted on 04/28/13--lasix and clonidine pm dose held. Orthostatic resolved this am.  The etiology is likely associated with the diuresis.  --repeat orthostatics this AM  # COPD: Appears stable and not in acute exacerbation .  # DM: Last AIC was 9.8 in 04/14. Hx of medical noncompliance. Lantus 20 units was restarted on 04/09/13 --lantus titrate up to 25  --SSi  # Depression: Stable. Hold celexa given prolonged QTc ( 518 ) on the EKG.   # Chronic pain: Holding tramadol for prolonged QTc. Continue methocarbamol for now.  - pt given vicodin    # DVT: Lovenox  Dispo: Disposition is deferred at this time, awaiting improvement of current medical problems.  Anticipated discharge in approximately 1 day(s).   The patient does not have a current PCP (No primary provider on file.), therefore is requiring OPC follow-up after  discharge.   The patient does not have transportation limitations that hinder transportation to clinic appointments.  .Services Needed at time of discharge: Y = Yes, Blank = No PT:   OT:   RN:   Equipment:   Other:     LOS: 3 days   [name] 04/30/2013, 10:07 AM

## 2013-04-30 NOTE — Progress Notes (Signed)
Went over all d/c instructions , meds , and follow up visits with pt

## 2013-04-30 NOTE — Telephone Encounter (Signed)
Dr Shirlee Latch has reviewed and will address at next appt

## 2013-04-30 NOTE — Progress Notes (Signed)
Pt discharged per W/C with all belongings. Accompanied by Nurse tech and friend. Pt has all d/c instructions and exit care notes

## 2013-04-30 NOTE — Discharge Summary (Signed)
Internal Medicine Teaching [facility] Discharge Note  Name: [name] MRN: [id number] DOB: May 27, 1966 47 y.o.  Date of Admission: 04/27/2013 12:41 AM Date of Discharge: 04/30/2013 Attending Physician: Burns Spain, MD  Discharge Diagnosis: Principal Problem:   CHF exacerbation Active Problems:   OSA (obstructive sleep apnea)   Type 2 diabetes mellitus, uncontrolled, with neuropathy   Depression   Discharge Medications:   Medication List    TAKE these medications       aspirin EC 81 MG tablet  Take 81 mg by mouth every morning.     carbamazepine 200 MG tablet  Commonly known as:  TEGRETOL  Take 200-400 mg by mouth 2 (two) times daily. Take 1 pill in AM and 2 pills in PM     citalopram 20 MG tablet  Commonly known as:  CELEXA  Take 20 mg by mouth every morning.     cloNIDine 0.2 MG tablet  Commonly known as:  CATAPRES  Take 1 tablet (0.2 mg total) by mouth 2 (two) times daily.     diclofenac sodium 1 % Gel  Commonly known as:  VOLTAREN  Apply 2 g topically 4 (four) times daily as needed (for pain).     furosemide 40 MG tablet  Commonly known as:  LASIX  Take 1 tablet (40 mg total) by mouth 2 (two) times daily.     ibuprofen 200 MG tablet  Commonly known as:  ADVIL,MOTRIN  Take 600 mg by mouth every 6 (six) hours as needed for pain.     insulin aspart 100 UNIT/ML injection  Commonly known as:  NOVOLOG FLEXPEN  Inject 7 Units into the skin 3 (three) times daily before meals.     insulin glargine 100 UNIT/ML injection  Commonly known as:  LANTUS SOLOSTAR  Inject 0.2 mLs (20 Units total) into the skin at bedtime. DX: 250.02. Will need 3 pens/month     methocarbamol 500 MG tablet  Commonly known as:  ROBAXIN  Take 500 mg by mouth every 12 (twelve) hours as needed (muscle spasms). For muscle spasm     metoprolol succinate 100 MG 24 hr tablet  Commonly known as:  TOPROL-XL  Take 1 tablet (100 mg total) by mouth daily. Take with or immediately  following a meal.     oxyCODONE-acetaminophen 10-325 MG per tablet  Commonly known as:  PERCOCET  Take 1 tablet by mouth every 8 (eight) hours as needed. For pain     pantoprazole 40 MG tablet  Commonly known as:  PROTONIX  Take 1 tablet (40 mg total) by mouth daily.     polyethylene glycol packet  Commonly known as:  MIRALAX / GLYCOLAX  Take 17 g by mouth daily as needed (constipation).     potassium chloride 10 MEQ tablet  Commonly known as:  K-DUR  Take 1 tablet (10 mEq total) by mouth daily.     promethazine 25 MG tablet  Commonly known as:  PHENERGAN  Take 1 tablet (25 mg total) by mouth every 6 (six) hours as needed for nausea.     simvastatin 40 MG tablet  Commonly known as:  ZOCOR  Take 1 tablet (40 mg total) by mouth daily at 6 PM.     spironolactone 25 MG tablet  Commonly known as:  ALDACTONE  Take 1 tablet (25 mg total) by mouth daily.     traMADol 50 MG tablet  Commonly known as:  ULTRAM  Take 1 tablet (50 mg total) by mouth  every 6 (six) hours as needed for pain.     VENTOLIN HFA 108 (90 BASE) MCG/ACT inhaler  Generic drug:  albuterol  Inhale 2 puffs into the lungs every 6 (six) hours as needed for wheezing or shortness of breath. For wheezing        Disposition and follow-up:   Ms.Giavonni E Whetsel was discharged from [facility] in Good condition.  At the hospital follow up visit please address heart failure and resolution of SOB without repeat orthostasis.  -recheck orthostatics -repeat EKG as one incident of prolonged QTc  Follow-up Appointments:     Follow-up Information   Follow up with PATEL,RAVI, MD. (05/10/13 at 2:00)    Contact information:   [address] [place] [address] [phone]      Discharge Orders   Future Appointments Provider Department Dept Phone   05/10/2013 2:00 PM Sunday Spillers, MD [facility] [phone]   06/25/2013 11:00 AM Ranelle Oyster, MD [facility]  Med[facility] [phone]   Future Orders Complete By Expires     Diet - low sodium heart healthy  As directed     Increase activity slowly  As directed        Consultations:    Procedures Performed:  Dg Chest Port 1 View  04/27/2013   *RADIOLOGY REPORT*  Clinical Data: Chest pain, shortness of breath.  PORTABLE CHEST - 1 VIEW  Comparison: 04/01/2013  Findings: Cardiomegaly with vascular congestion the.  No overt edema.  No confluent opacities or effusions.  No acute bony abnormality.  IMPRESSION: Cardiomegaly, vascular congestion.   Original Report Authenticated By: Charlett Nose, M.D.   Dg Chest Portable 1 View  04/01/2013   *RADIOLOGY REPORT*  Clinical Data: Chest pain and shortness of breath; left shoulder and arm pain.  PORTABLE CHEST - 1 VIEW  Comparison: Chest radiograph and CTA of the chest performed 02/24/2013  Findings: The lungs are well-aerated.  Mild peribronchial thickening is noted.  Minimal bibasilar opacities may reflect atelectasis.  There is no evidence of pleural effusion or pneumothorax.  The cardiomediastinal silhouette is borderline enlarged.  No acute osseous abnormalities are seen.  IMPRESSION: Minimal bibasilar opacities may reflect atelectasis.  Mild peribronchial thickening noted; borderline cardiomegaly.   Original Report Authenticated By: Tonia Ghent, M.D.    Admission HPI: 47 y.o. female history of poorly controlled hypertension, poorly controlled type 2 diabetes, obstructive sleep apnea, and chronic combined systolic and diastolic heart failure who presents with acute onset of shortness of breath. She was transferred to IMT[facility] from [facility] to [facility] for further management. Patient was very sleeply at the time of history taking , most of the history is obtained from the chart. She states that she has been having gradually progressive worsening SOB x 2 weeks, that got really worse yesterday night. She did have some chest tightness when she presented  to the ED that has gotten better now. She also reports waxing and waning bilateral LE swelling for last 2 weeks. She also reports having some cough where she is bringing whitish phlegm. Denies any orthopnea or PND or palpitations. Reviewing the EDP note- it seems like the patient was pulled over by the police earlier ( 5/15) after which, (and receiving a ticket) patient started to have some shortness of breath, and productive cough. She's also had some chest tightness in the center of her chest that is nonradiating, not associated with diaphoresis, nausea vomiting. ED course: She  was started on nitroglycerine gtt, was given 80 mg of IV lasix with the output ~2.5 litres.    Hospital Course by problem list: #Acute on chronic combined systolic and diastolic CHF: Pt class NYHA I-II. Initially presented with HTN, tachycardia and moderate SOB. CXR showed some pulmonary vascular congestion and pt was started on Nitro gtt but that was d/c since 04/27/13. Pt was able to diurese well with net negative of ~ 5L by the time of d/c. Weight also decreased by 8 lbs since admission. Pt did have some orthostatics during admission and therefore believed pt had been more aggressively diuresed. Last ECHO in Dec 2014 showed EF 40-45% with moderate LVH. Pt was continued on her ASA, BB and statin by d/c.   # Orthostatic hypotension- Noted on 04/28/13, likely 2/2 to over diuresis therefore lasix and clonidine doses were held. Orthostatics resolved by discharge. All diuretics were restarted at d/c.   # COPD: Pt was not in acute exacerbation during this admission.  .  # DM: Last AIC was 9.8 in 04/14. Hx of medical noncompliance. Lantus 20 units was restarted on 04/09/13 and then lantus titrated up to 25.  # Depression: Stable. Hold celexa given prolonged QTc ( 518 ) on the EKG but this resolved by discharge and restarted.   # Chronic pain: Continue methocarbamol and pt given vicodin upon d/c.   Discharge Vitals:  BP 130/97   Pulse 95  Temp(Src) 98.4 F (36.9 C) (Oral)  Resp 22  Ht 5\' 9"  (1.753 m)  Wt 279 lb 8.7 oz (126.8 kg)  BMI 41.26 kg/m2  SpO2 99%  LMP 04/16/2013 General: NAD  Lungs: CTA B/L  Heart: RRR. No M/G/R  Abd: Soft, non tenderness, no rebound tenderness, BS active x 4  Discharge Labs:  Results for orders placed during the hospital encounter of 04/27/13 (from the past 24 hour(s))  GLUCOSE, CAPILLARY     Status: Abnormal   Collection Time    04/29/13 11:29 AM      Result Value Range   Glucose-Capillary 219 (*) 70 - 99 mg/dL   Comment 1 Notify RN    GLUCOSE, CAPILLARY     Status: Abnormal   Collection Time    04/29/13  4:37 PM      Result Value Range   Glucose-Capillary 190 (*) 70 - 99 mg/dL   Comment 1 Notify RN    GLUCOSE, CAPILLARY     Status: Abnormal   Collection Time    04/29/13  8:56 PM      Result Value Range   Glucose-Capillary 295 (*) 70 - 99 mg/dL   Comment 1 Notify RN    BASIC METABOLIC PANEL     Status: Abnormal   Collection Time    04/30/13  5:00 AM      Result Value Range   Sodium 136  135 - 145 mEq/L   Potassium 3.9  3.5 - 5.1 mEq/L   Chloride 96  96 - 112 mEq/L   CO2 31  19 - 32 mEq/L   Glucose, Bld 224 (*) 70 - 99 mg/dL   BUN 11  6 - 23 mg/dL   Creatinine, Ser 7.82  0.50 - 1.10 mg/dL   Calcium 9.3  8.4 - 95.6 mg/dL   GFR calc non Af Amer 73 (*) >90 mL/min   GFR calc Af Amer 84 (*) >90 mL/min  MAGNESIUM     Status: None   Collection Time    04/30/13  5:00 AM  Result Value Range   Magnesium 1.9  1.5 - 2.5 mg/dL  GLUCOSE, CAPILLARY     Status: Abnormal   Collection Time    04/30/13  5:49 AM      Result Value Range   Glucose-Capillary 207 (*) 70 - 99 mg/dL   Comment 1 Notify RN    GLUCOSE, CAPILLARY     Status: Abnormal   Collection Time    04/30/13  6:15 AM      Result Value Range   Glucose-Capillary 227 (*) 70 - 99 mg/dL    Signed: [name] 04/30/2013, 10:08 AM   Time Spent on Discharge: Services Ordered on Discharge:  none Equipment Ordered on Discharge: none

## 2013-05-08 ENCOUNTER — Telehealth: Payer: Self-pay | Admitting: *Deleted

## 2013-05-08 DIAGNOSIS — E1140 Type 2 diabetes mellitus with diabetic neuropathy, unspecified: Secondary | ICD-10-CM

## 2013-05-08 DIAGNOSIS — E1142 Type 2 diabetes mellitus with diabetic polyneuropathy: Secondary | ICD-10-CM

## 2013-05-08 DIAGNOSIS — M1712 Unilateral primary osteoarthritis, left knee: Secondary | ICD-10-CM

## 2013-05-08 DIAGNOSIS — M47816 Spondylosis without myelopathy or radiculopathy, lumbar region: Secondary | ICD-10-CM

## 2013-05-08 DIAGNOSIS — M4716 Other spondylosis with myelopathy, lumbar region: Secondary | ICD-10-CM

## 2013-05-08 MED ORDER — OXYCODONE-ACETAMINOPHEN 10-325 MG PO TABS: 1.0000 | ORAL_TABLET | Freq: Three times a day (TID) | ORAL | Status: DC | PRN

## 2013-05-08 NOTE — Telephone Encounter (Signed)
[name] needs specific orders for walker (one that has a seat) for when she goes out and orders for bedside toilet.  They do not accept stamped signature from MD, must be signed.

## 2013-05-08 NOTE — Telephone Encounter (Signed)
Refill oxycodone.  Rx printed for [name] to sign.

## 2013-05-09 MED ORDER — OXYCODONE-ACETAMINOPHEN 10-325 MG PO TABS: 1.0000 | ORAL_TABLET | Freq: Three times a day (TID) | ORAL | Status: DC | PRN

## 2013-05-10 ENCOUNTER — Ambulatory Visit: Payer: Medicaid Other | Admitting: Dietician

## 2013-05-10 ENCOUNTER — Ambulatory Visit: Payer: Medicaid Other | Admitting: Internal Medicine

## 2013-05-10 NOTE — Telephone Encounter (Signed)
Remind me tomorrow at the office

## 2013-05-14 ENCOUNTER — Telehealth: Payer: Self-pay | Admitting: Physical Medicine & Rehabilitation

## 2013-05-14 DIAGNOSIS — M7918 Myalgia, other site: Secondary | ICD-10-CM

## 2013-05-14 DIAGNOSIS — M1712 Unilateral primary osteoarthritis, left knee: Secondary | ICD-10-CM

## 2013-05-14 DIAGNOSIS — E1142 Type 2 diabetes mellitus with diabetic polyneuropathy: Secondary | ICD-10-CM

## 2013-05-14 DIAGNOSIS — M47816 Spondylosis without myelopathy or radiculopathy, lumbar region: Secondary | ICD-10-CM

## 2013-05-14 DIAGNOSIS — I5032 Chronic diastolic (congestive) heart failure: Secondary | ICD-10-CM

## 2013-05-14 NOTE — Telephone Encounter (Signed)
Patient came to window needing to know about the [facility] order.  Please see last note.

## 2013-05-14 NOTE — Telephone Encounter (Signed)
Done

## 2013-05-14 NOTE — Addendum Note (Signed)
Addended by: Neomia Dear on: 05/14/2013 07:35 PM   Modules accepted: Orders

## 2013-05-15 ENCOUNTER — Other Ambulatory Visit: Payer: Self-pay | Admitting: *Deleted

## 2013-05-15 DIAGNOSIS — I5042 Chronic combined systolic (congestive) and diastolic (congestive) heart failure: Secondary | ICD-10-CM

## 2013-05-15 DIAGNOSIS — I5032 Chronic diastolic (congestive) heart failure: Secondary | ICD-10-CM

## 2013-05-15 DIAGNOSIS — K219 Gastro-esophageal reflux disease without esophagitis: Secondary | ICD-10-CM

## 2013-05-15 DIAGNOSIS — I10 Essential (primary) hypertension: Secondary | ICD-10-CM

## 2013-05-17 ENCOUNTER — Other Ambulatory Visit: Payer: Self-pay | Admitting: *Deleted

## 2013-05-17 DIAGNOSIS — K219 Gastro-esophageal reflux disease without esophagitis: Secondary | ICD-10-CM

## 2013-05-17 DIAGNOSIS — I5032 Chronic diastolic (congestive) heart failure: Secondary | ICD-10-CM

## 2013-05-17 DIAGNOSIS — I5042 Chronic combined systolic (congestive) and diastolic (congestive) heart failure: Secondary | ICD-10-CM

## 2013-05-17 DIAGNOSIS — I10 Essential (primary) hypertension: Secondary | ICD-10-CM

## 2013-05-17 MED ORDER — PANTOPRAZOLE SODIUM 40 MG PO TBEC: 40.0000 mg | DELAYED_RELEASE_TABLET | Freq: Every day | ORAL | Status: DC

## 2013-05-17 MED ORDER — INSULIN PEN NEEDLE 31G X 5 MM MISC: Status: DC

## 2013-05-17 MED ORDER — CLONIDINE HCL 0.2 MG PO TABS: 0.2000 mg | ORAL_TABLET | Freq: Two times a day (BID) | ORAL | Status: DC

## 2013-05-17 MED ORDER — FUROSEMIDE 40 MG PO TABS: 40.0000 mg | ORAL_TABLET | Freq: Two times a day (BID) | ORAL | Status: DC

## 2013-05-17 NOTE — Telephone Encounter (Signed)
Rx faxed in.

## 2013-05-17 NOTE — Telephone Encounter (Signed)
Dr Shirlee Latch will not refill

## 2013-05-17 NOTE — Telephone Encounter (Signed)
Who is this patients PCP?

## 2013-05-29 ENCOUNTER — Telehealth: Payer: Self-pay

## 2013-05-29 DIAGNOSIS — M47817 Spondylosis without myelopathy or radiculopathy, lumbosacral region: Secondary | ICD-10-CM

## 2013-05-29 DIAGNOSIS — E1149 Type 2 diabetes mellitus with other diabetic neurological complication: Secondary | ICD-10-CM

## 2013-05-29 DIAGNOSIS — I5032 Chronic diastolic (congestive) heart failure: Secondary | ICD-10-CM

## 2013-05-29 DIAGNOSIS — M1710 Unilateral primary osteoarthritis, unspecified knee: Secondary | ICD-10-CM

## 2013-05-29 DIAGNOSIS — IMO0001 Reserved for inherently not codable concepts without codable children: Secondary | ICD-10-CM

## 2013-05-29 DIAGNOSIS — E1142 Type 2 diabetes mellitus with diabetic polyneuropathy: Secondary | ICD-10-CM

## 2013-05-29 DIAGNOSIS — I509 Heart failure, unspecified: Secondary | ICD-10-CM

## 2013-05-29 NOTE — Telephone Encounter (Signed)
Patient called to follow up on walker and bedside commode order.  Please advise.  Order placed.  Left message advising patient this was done.

## 2013-05-31 ENCOUNTER — Other Ambulatory Visit: Payer: Self-pay | Admitting: *Deleted

## 2013-05-31 DIAGNOSIS — I10 Essential (primary) hypertension: Secondary | ICD-10-CM

## 2013-05-31 DIAGNOSIS — K219 Gastro-esophageal reflux disease without esophagitis: Secondary | ICD-10-CM

## 2013-05-31 DIAGNOSIS — I5032 Chronic diastolic (congestive) heart failure: Secondary | ICD-10-CM

## 2013-05-31 MED ORDER — POLYETHYLENE GLYCOL 3350 17 G PO PACK: 17.0000 g | PACK | Freq: Every day | ORAL | Status: DC | PRN

## 2013-05-31 MED ORDER — INSULIN PEN NEEDLE 31G X 5 MM MISC: Status: DC

## 2013-05-31 MED ORDER — SPIRONOLACTONE 25 MG PO TABS: 25.0000 mg | ORAL_TABLET | Freq: Every day | ORAL | Status: DC

## 2013-05-31 MED ORDER — PANTOPRAZOLE SODIUM 40 MG PO TBEC: 40.0000 mg | DELAYED_RELEASE_TABLET | Freq: Every day | ORAL | Status: DC

## 2013-05-31 MED ORDER — CLONIDINE HCL 0.2 MG PO TABS: 0.2000 mg | ORAL_TABLET | Freq: Two times a day (BID) | ORAL | Status: DC

## 2013-05-31 NOTE — Telephone Encounter (Signed)
Pt uses [facility] pharmacy.

## 2013-05-31 NOTE — Telephone Encounter (Signed)
Please remind patient to keep his scheduled clinic appointment this month.

## 2013-06-06 ENCOUNTER — Telehealth: Payer: Self-pay

## 2013-06-06 ENCOUNTER — Encounter: Payer: Self-pay | Admitting: Internal Medicine

## 2013-06-06 ENCOUNTER — Ambulatory Visit (HOSPITAL_COMMUNITY)
Admission: RE | Admit: 2013-06-06 | Discharge: 2013-06-06 | Disposition: A | Discharge status: Home / Self Care | Payer: Medicaid Other | Source: Ambulatory Visit | Attending: Internal Medicine | Admitting: Internal Medicine

## 2013-06-06 ENCOUNTER — Ambulatory Visit (INDEPENDENT_AMBULATORY_CARE_PROVIDER_SITE_OTHER): Payer: Medicaid Other | Admitting: Internal Medicine

## 2013-06-06 VITALS — BP 140/104 | HR 102 | Temp 98.3°F | Ht 69.0 in | Wt 280.1 lb

## 2013-06-06 DIAGNOSIS — R9431 Abnormal electrocardiogram [ECG] [EKG]: Secondary | ICD-10-CM | POA: Insufficient documentation

## 2013-06-06 DIAGNOSIS — I10 Essential (primary) hypertension: Secondary | ICD-10-CM | POA: Insufficient documentation

## 2013-06-06 DIAGNOSIS — M4716 Other spondylosis with myelopathy, lumbar region: Secondary | ICD-10-CM

## 2013-06-06 DIAGNOSIS — I509 Heart failure, unspecified: Secondary | ICD-10-CM

## 2013-06-06 DIAGNOSIS — IMO0002 Reserved for concepts with insufficient information to code with codable children: Secondary | ICD-10-CM

## 2013-06-06 DIAGNOSIS — M47816 Spondylosis without myelopathy or radiculopathy, lumbar region: Secondary | ICD-10-CM

## 2013-06-06 DIAGNOSIS — E1149 Type 2 diabetes mellitus with other diabetic neurological complication: Secondary | ICD-10-CM

## 2013-06-06 DIAGNOSIS — E119 Type 2 diabetes mellitus without complications: Secondary | ICD-10-CM | POA: Insufficient documentation

## 2013-06-06 DIAGNOSIS — R0602 Shortness of breath: Secondary | ICD-10-CM | POA: Insufficient documentation

## 2013-06-06 DIAGNOSIS — E1140 Type 2 diabetes mellitus with diabetic neuropathy, unspecified: Secondary | ICD-10-CM

## 2013-06-06 DIAGNOSIS — F329 Major depressive disorder, single episode, unspecified: Secondary | ICD-10-CM

## 2013-06-06 DIAGNOSIS — E1142 Type 2 diabetes mellitus with diabetic polyneuropathy: Secondary | ICD-10-CM

## 2013-06-06 DIAGNOSIS — I5042 Chronic combined systolic (congestive) and diastolic (congestive) heart failure: Secondary | ICD-10-CM

## 2013-06-06 DIAGNOSIS — E785 Hyperlipidemia, unspecified: Secondary | ICD-10-CM | POA: Insufficient documentation

## 2013-06-06 DIAGNOSIS — N390 Urinary tract infection, site not specified: Secondary | ICD-10-CM

## 2013-06-06 DIAGNOSIS — K219 Gastro-esophageal reflux disease without esophagitis: Secondary | ICD-10-CM

## 2013-06-06 DIAGNOSIS — I447 Left bundle-branch block, unspecified: Secondary | ICD-10-CM | POA: Insufficient documentation

## 2013-06-06 DIAGNOSIS — F32A Depression, unspecified: Secondary | ICD-10-CM

## 2013-06-06 DIAGNOSIS — Z79899 Other long term (current) drug therapy: Secondary | ICD-10-CM | POA: Insufficient documentation

## 2013-06-06 DIAGNOSIS — I5043 Acute on chronic combined systolic (congestive) and diastolic (congestive) heart failure: Secondary | ICD-10-CM | POA: Insufficient documentation

## 2013-06-06 DIAGNOSIS — M1712 Unilateral primary osteoarthritis, left knee: Secondary | ICD-10-CM

## 2013-06-06 DIAGNOSIS — I5032 Chronic diastolic (congestive) heart failure: Secondary | ICD-10-CM

## 2013-06-06 LAB — POCT URINALYSIS DIPSTICK
Bilirubin, UA: NEGATIVE
Leukocytes, UA: NEGATIVE
Spec Grav, UA: >=1.03

## 2013-06-06 MED ORDER — POLYETHYLENE GLYCOL 3350 17 G PO PACK: 17.0000 g | PACK | Freq: Every day | ORAL | Status: DC | PRN

## 2013-06-06 MED ORDER — OXYCODONE-ACETAMINOPHEN 10-325 MG PO TABS: 1.0000 | ORAL_TABLET | Freq: Three times a day (TID) | ORAL | Status: DC | PRN

## 2013-06-06 MED ORDER — ASPIRIN EC 81 MG PO TBEC: 81.0000 mg | DELAYED_RELEASE_TABLET | Freq: Every morning | ORAL | Status: DC

## 2013-06-06 MED ORDER — CLONIDINE HCL 0.2 MG PO TABS: 0.2000 mg | ORAL_TABLET | Freq: Two times a day (BID) | ORAL | Status: DC

## 2013-06-06 MED ORDER — SIMVASTATIN 40 MG PO TABS: 40.0000 mg | ORAL_TABLET | Freq: Every day | ORAL | Status: DC

## 2013-06-06 MED ORDER — FUROSEMIDE 40 MG PO TABS: 40.0000 mg | ORAL_TABLET | Freq: Two times a day (BID) | ORAL | Status: DC

## 2013-06-06 MED ORDER — METOPROLOL SUCCINATE ER 100 MG PO TB24: 100.0000 mg | ORAL_TABLET | Freq: Every day | ORAL | Status: DC

## 2013-06-06 MED ORDER — ALBUTEROL SULFATE HFA 108 (90 BASE) MCG/ACT IN AERS: 2.0000 | INHALATION_SPRAY | Freq: Four times a day (QID) | RESPIRATORY_TRACT | Status: DC | PRN

## 2013-06-06 MED ORDER — SPIRONOLACTONE 25 MG PO TABS: 25.0000 mg | ORAL_TABLET | Freq: Every day | ORAL | Status: DC

## 2013-06-06 MED ORDER — PANTOPRAZOLE SODIUM 40 MG PO TBEC: 40.0000 mg | DELAYED_RELEASE_TABLET | Freq: Every day | ORAL | Status: DC

## 2013-06-06 MED ORDER — CITALOPRAM HYDROBROMIDE 20 MG PO TABS: 20.0000 mg | ORAL_TABLET | Freq: Every morning | ORAL | Status: DC

## 2013-06-06 NOTE — Assessment & Plan Note (Signed)
Pt w prolonged QTc (530), unchanged from prior EKG and also present on multiple previous. In terms of medicines, high dose celexa may prolong QT but would not expect 20mg  celexa to have much of an effect. Additionally, this medicine has helped her mood significantly and she has struggled with depression without it.  Phenergan may also prolong QT slightly. - continue celexa 20mg  daily - instructed pt to STOP TAKING phenergan

## 2013-06-06 NOTE — Assessment & Plan Note (Signed)
Pt w prolonged QTc (530), unchanged from prior EKG and also present on multiple previous. In terms of medicines, high dose celexa may prolong QT but would not expect 20mg celexa to have much of an effect. Additionally, this medicine has helped her mood significantly and she has struggled with depression without it.  Phenergan may also prolong QT slightly. - continue celexa 20mg daily - instructed pt to STOP TAKING phenergan 

## 2013-06-06 NOTE — Progress Notes (Signed)
Case discussed with Dr. Ziemer at the time of the visit.  We reviewed the resident's history and exam and pertinent patient test results.  I agree with the assessment, diagnosis, and plan of care documented in the resident's note.  

## 2013-06-06 NOTE — Progress Notes (Signed)
Patient ID: [name], female   DOB: September 09, 1966, 47 y.o.   MRN: [id number]  Subjective:   Patient ID: [name] female   DOB: September 09, 1966 47 y.o.   MRN: [id number]  HPI: [name] is a 47 y.o. female with extensive pmh described below and recently hospital admission for acute on chronic combined systolic and diastolic HF presenting to the clinic for hospital follow-up. Since her admission, says that she has complied w spironolactone and BID lasix. Says weight is stable. She says on days she feels SOB, she takes extra lasix and feels better. Minimal LE edema. No CP/pressure. Last ECHO in Dec 2014 showed EF 40-45% with moderate LVH Of note, pt orthostatic during hospital admission. No sx of orthostasis at home.  Of note, pt also w prolonged QT during admission. This has been noted on several past EKGs.  Also w poorly controlled T2DM. Patient cannot recall her doses of lantus or novolog at this time, says family member gives her all her injections and is not here. Has meter but left at home. Reports sugars range 100-200. Denies s/s hypoglycemia.   Complains of intermittent dysuria. No flank pain/fever/chills.    Past Medical History  Diagnosis Date  . Hypertension     Poorly controlled. Has had HTN since age 89. Angioedema with ACEI.  24 Hr urine and renal arterial dopplers ordered . . . Never done  . Type II or unspecified type diabetes mellitus without mention of complication, uncontrolled DX: 2002  . NICM (nonischemic cardiomyopathy)     EF 45-50% in 8/12, cath 6/12 showed normal coronaries, EF 50-55% by LV gram  . Morbid obesity   . Allergic rhinitis   . HLD (hyperlipidemia)   . OSA on CPAP     sleep study in 8/12 showed moderate to severe OSA requiring CPAP  . Chronic lower back pain     secondary to DJD, obsetiy, hip problems. Followed by Dr. Ivory Broad (pain management)  . 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 Chronic diastolic heart failure       Primarily diastolic CHF: Likely due to uncontrolled HTN. Last echo (8/12) with EF 45-50%, mild to moderate LVH with some asymmetric septal hypertrophy, RV normal size and systolic function. EF 50-55% by LV-gram in 6/12.   . Degeneration of lumbar or lumbosacral intervertebral disc   . Polyneuropathy in diabetes(357.2)   . Thoracic or lumbosacral neuritis or radiculitis, unspecified   . Calcifying tendinitis of shoulder   . LBBB (left bundle branch block)   . Chronic combined systolic and diastolic CHF (congestive heart failure)     EF 40-45% by echo 12/06/2012  . Coronary artery disease     questionable. LHC 05/2011 showing normal coronaries // Followed at [facility], Dr. Shirlee Latch  . GERD (gastroesophageal reflux disease)    Current Outpatient Prescriptions  Medication Sig Dispense Refill  . albuterol (VENTOLIN HFA) 108 (90 BASE) MCG/ACT inhaler Inhale 2 puffs into the lungs every 6 (six) hours as needed for wheezing or shortness of breath. For wheezing      . aspirin EC 81 MG tablet Take 81 mg by mouth every morning.       . carbamazepine (TEGRETOL) 200 MG tablet Take 200-400 mg by mouth 2 (two) times daily. Take 1 pill in AM and 2 pills in PM      . citalopram (CELEXA) 20 MG tablet Take 20 mg by mouth every morning.      [name]  cloNIDine (CATAPRES) 0.2 MG tablet Take 1 tablet (0.2 mg total) by mouth 2 (two) times daily.  60 tablet  0  . diclofenac sodium (VOLTAREN) 1 % GEL Apply 2 g topically 4 (four) times daily as needed (for pain).      . furosemide (LASIX) 40 MG tablet Take 1 tablet (40 mg total) by mouth 2 (two) times daily.  90 tablet  0  . ibuprofen (ADVIL,MOTRIN) 200 MG tablet Take 600 mg by mouth every 6 (six) hours as needed for pain.      [name] insulin aspart (NOVOLOG FLEXPEN) 100 UNIT/ML injection Inject 7 Units into the skin 3 (three) times daily before meals.  1 vial  1  . insulin glargine (LANTUS SOLOSTAR) 100 UNIT/ML injection Inject 0.2 mLs (20 Units total) into the skin  at bedtime. DX: 250.02. Will need 3 pens/month  3 pen  1  . Insulin Pen Needle 31G X 5 MM MISC Use to inject insulin 4 times a day  120 each  0  . methocarbamol (ROBAXIN) 500 MG tablet Take 500 mg by mouth every 12 (twelve) hours as needed (muscle spasms). For muscle spasm      . metoprolol succinate (TOPROL-XL) 100 MG 24 hr tablet Take 1 tablet (100 mg total) by mouth daily. Take with or immediately following a meal.  30 tablet  0  . oxyCODONE-acetaminophen (PERCOCET) 10-325 MG per tablet Take 1 tablet by mouth every 8 (eight) hours as needed. For pain  60 tablet  0  . pantoprazole (PROTONIX) 40 MG tablet Take 1 tablet (40 mg total) by mouth daily.  30 tablet  0  . polyethylene glycol (MIRALAX / GLYCOLAX) packet Take 17 g by mouth daily as needed (constipation).  14 each  0  . potassium chloride (K-DUR) 10 MEQ tablet Take 1 tablet (10 mEq total) by mouth daily.  30 tablet  1  . promethazine (PHENERGAN) 25 MG tablet Take 1 tablet (25 mg total) by mouth every 6 (six) hours as needed for nausea.  15 tablet  0  . simvastatin (ZOCOR) 40 MG tablet Take 1 tablet (40 mg total) by mouth daily at 6 PM.  30 tablet  11  . spironolactone (ALDACTONE) 25 MG tablet Take 1 tablet (25 mg total) by mouth daily.  30 tablet  0  . traMADol (ULTRAM) 50 MG tablet Take 1 tablet (50 mg total) by mouth every 6 (six) hours as needed for pain.  20 tablet  0   No current facility-administered medications for this visit.   Family History  Problem Relation Age of Onset  . Heart disease Mother 19    Died of MI at age 97 yo  . Heart disease Paternal Grandmother     requiring pacemaker.  [name] Heart disease Paternal Grandfather 24    Died of MI at possibly age 15-53yo  . Stroke Paternal Grandfather   . Heart disease Father 60    MI age 18yo requiring stenting  . Kidney disease Mother     requiring dialysis  . Congestive Heart Failure Mother   . Diabetes Father   . Diabetes Brother   . Heart disease Brother 26    MI at age  62 years old  . Breast cancer Paternal Aunt   . Breast cancer Maternal Grandmother    History   Social History  . Marital Status: Single    Spouse Name: N/A    Number of Children: 2  . Years of Education: 9th grade  Occupational History  . Unemployed     planning on getting disability   Social History Main Topics  . Smoking status: Never Smoker   . Smokeless tobacco: Never Used  . Alcohol Use: No  . Drug Use: No  . Sexually Active: Yes    Birth Control/ Protection: Surgical   Other Topics Concern  . None   Social History Narrative   Lives in [place] with her son. Is able to read and write fluently in [place].   Review of Systems: 10 pt ROS performed, pertinent positives and negatives noted in HPI Objective:  Physical Exam: Filed Vitals:   06/06/13 1453  BP: 141/104  Pulse: 103  Temp: 98.3 F (36.8 C)  TempSrc: Oral  Height: 5\' 9"  (1.753 m)  Weight: 280 lb 1.6 oz (127.053 kg)  SpO2: 100%   Vitals reviewed. General: sitting in chair, NAD HEENT: PERRL, EOMI, no scleral icterus Cardiac: RRR, no rubs, murmurs or gallops Pulm: clear without rales Abd: obese and nontender Ext: trace pedal edema Neuro: alert and oriented X3, cranial nerves II-XII grossly intact   ECG REPORT   Date: 06/06/2013  EKG Time: 5:46 PM  Rate: 97  Rhythm: normal sinus rhythm  Axis: leftward  Intervals:left bundle branch block and QTc  ST&T Change: TWI in V6  Narrative Interpretation: Normal sinus rhythm w LBBB, LAE, prolonged QTc. No significant changes from prior 04/30/13             Assessment & Plan:   Please see problem-based charting for assessment and plan.

## 2013-06-06 NOTE — Patient Instructions (Addendum)
1. Come back in 2 weeks to address your diabetes - bring your meter! 2. I have sent refills of some medicines. Hold off on your potassium until you hear back from me about your labs. 3. Don't take phenergan as it may contribute to heart arrythmia.

## 2013-06-06 NOTE — Assessment & Plan Note (Addendum)
Mildly elevated today 140/104, but not far above goal given difficult to control HTN. Continue metoprolol, clonidine, spironolactone, lasix.  Check bmet today  BMET    Component Value Date/Time   NA 137 06/06/2013 1652   K 4.2 06/06/2013 1652   CL 101 06/06/2013 1652   CO2 27 06/06/2013 1652   GLUCOSE 187* 06/06/2013 1652   BUN 8 06/06/2013 1652   CREATININE 0.86 06/06/2013 1652   CREATININE 0.93 04/30/2013 0500   CALCIUM 9.5 06/06/2013 1652   GFRNONAA 73* 04/30/2013 0500   GFRAA 84* 04/30/2013 0500   Cr, K stable

## 2013-06-06 NOTE — Telephone Encounter (Signed)
Patient called for medication refill.  Percocet printed.  Will also fax [facility] again for patient.  [phone].

## 2013-06-06 NOTE — Assessment & Plan Note (Signed)
Urine dip w >1000 glucose and trace ketones, no blood, nit, or leuk.  - Encouraged to drink water, monitor sx, may need repeat UA at next visit w culture if still sx

## 2013-06-06 NOTE — Assessment & Plan Note (Signed)
Weight stable at 280 lbs since hospital d/c. No evidence of acute decompensated HF or volume overload today (trace edema, no crackles). - continue lasix, spironolactone. On BB and ASA. ACE-i allergic. - BMet today

## 2013-06-06 NOTE — Assessment & Plan Note (Signed)
Pt did not bring meter, unsure of insulin doses.  I think the best is to address her DM mgmt when she has her meds with her and we can look at her meter readings. At this point, she can't even tell me how much insulin she is taking.  - RTC in 2 weeks for DM follow up w MD, schedule appt w Norm Parcel same day

## 2013-06-07 LAB — BASIC METABOLIC PANEL WITH GFR
CO2: 27 mEq/L (ref 19–32)
Calcium: 9.5 mg/dL (ref 8.4–10.5)
Chloride: 101 mEq/L (ref 96–112)
Creat: 0.86 mg/dL (ref 0.50–1.10)
Glucose, Bld: 187 mg/dL — ABNORMAL HIGH (ref 70–99)

## 2013-06-08 ENCOUNTER — Telehealth: Payer: Self-pay

## 2013-06-08 DIAGNOSIS — E669 Obesity, unspecified: Secondary | ICD-10-CM

## 2013-06-08 DIAGNOSIS — E1149 Type 2 diabetes mellitus with other diabetic neurological complication: Secondary | ICD-10-CM

## 2013-06-08 DIAGNOSIS — IMO0001 Reserved for inherently not codable concepts without codable children: Secondary | ICD-10-CM

## 2013-06-08 DIAGNOSIS — M4716 Other spondylosis with myelopathy, lumbar region: Secondary | ICD-10-CM

## 2013-06-08 DIAGNOSIS — M1710 Unilateral primary osteoarthritis, unspecified knee: Secondary | ICD-10-CM

## 2013-06-08 DIAGNOSIS — M47817 Spondylosis without myelopathy or radiculopathy, lumbosacral region: Secondary | ICD-10-CM

## 2013-06-08 DIAGNOSIS — E1142 Type 2 diabetes mellitus with diabetic polyneuropathy: Secondary | ICD-10-CM

## 2013-06-08 NOTE — Telephone Encounter (Signed)
[name] with [facility] called to let us know patient is getting her bedside commode today.  They also need a different order for a roller walker.  Please fax to [phone].  Order was faxed.

## 2013-06-11 ENCOUNTER — Telehealth: Payer: Self-pay

## 2013-06-11 DIAGNOSIS — M1710 Unilateral primary osteoarthritis, unspecified knee: Secondary | ICD-10-CM

## 2013-06-11 DIAGNOSIS — E1142 Type 2 diabetes mellitus with diabetic polyneuropathy: Secondary | ICD-10-CM

## 2013-06-11 DIAGNOSIS — I10 Essential (primary) hypertension: Secondary | ICD-10-CM

## 2013-06-11 DIAGNOSIS — E669 Obesity, unspecified: Secondary | ICD-10-CM

## 2013-06-11 DIAGNOSIS — IMO0001 Reserved for inherently not codable concepts without codable children: Secondary | ICD-10-CM

## 2013-06-11 DIAGNOSIS — M4716 Other spondylosis with myelopathy, lumbar region: Secondary | ICD-10-CM

## 2013-06-11 DIAGNOSIS — E1149 Type 2 diabetes mellitus with other diabetic neurological complication: Secondary | ICD-10-CM

## 2013-06-12 NOTE — Telephone Encounter (Signed)
-  Rolling walker ordered 

## 2013-06-20 ENCOUNTER — Encounter: Payer: Self-pay | Admitting: Internal Medicine

## 2013-06-20 ENCOUNTER — Ambulatory Visit: Payer: Medicaid Other | Admitting: Internal Medicine

## 2013-06-25 ENCOUNTER — Encounter: Payer: Self-pay | Admitting: Physical Medicine & Rehabilitation

## 2013-06-25 ENCOUNTER — Encounter: Payer: Medicaid Other | Attending: Physical Medicine & Rehabilitation | Admitting: Physical Medicine & Rehabilitation

## 2013-06-25 VITALS — BP 165/92 | HR 107 | Resp 16 | Ht 69.0 in | Wt 282.0 lb

## 2013-06-25 DIAGNOSIS — I5042 Chronic combined systolic (congestive) and diastolic (congestive) heart failure: Secondary | ICD-10-CM | POA: Insufficient documentation

## 2013-06-25 DIAGNOSIS — M1710 Unilateral primary osteoarthritis, unspecified knee: Secondary | ICD-10-CM

## 2013-06-25 DIAGNOSIS — M47817 Spondylosis without myelopathy or radiculopathy, lumbosacral region: Secondary | ICD-10-CM | POA: Insufficient documentation

## 2013-06-25 DIAGNOSIS — M4716 Other spondylosis with myelopathy, lumbar region: Secondary | ICD-10-CM

## 2013-06-25 DIAGNOSIS — E1140 Type 2 diabetes mellitus with diabetic neuropathy, unspecified: Secondary | ICD-10-CM

## 2013-06-25 DIAGNOSIS — M47816 Spondylosis without myelopathy or radiculopathy, lumbar region: Secondary | ICD-10-CM

## 2013-06-25 DIAGNOSIS — I509 Heart failure, unspecified: Secondary | ICD-10-CM

## 2013-06-25 DIAGNOSIS — IMO0001 Reserved for inherently not codable concepts without codable children: Secondary | ICD-10-CM

## 2013-06-25 DIAGNOSIS — E1149 Type 2 diabetes mellitus with other diabetic neurological complication: Secondary | ICD-10-CM

## 2013-06-25 DIAGNOSIS — E1142 Type 2 diabetes mellitus with diabetic polyneuropathy: Secondary | ICD-10-CM | POA: Insufficient documentation

## 2013-06-25 DIAGNOSIS — M7918 Myalgia, other site: Secondary | ICD-10-CM

## 2013-06-25 DIAGNOSIS — M1712 Unilateral primary osteoarthritis, left knee: Secondary | ICD-10-CM

## 2013-06-25 MED ORDER — ROLLATOR MISC: Status: DC

## 2013-06-25 MED ORDER — OXYCODONE-ACETAMINOPHEN 10-325 MG PO TABS: 1.0000 | ORAL_TABLET | Freq: Three times a day (TID) | ORAL | Status: DC | PRN

## 2013-06-25 MED ORDER — CYCLOBENZAPRINE HCL 10 MG PO TABS: 10.0000 mg | ORAL_TABLET | Freq: Three times a day (TID) | ORAL | Status: DC | PRN

## 2013-06-25 NOTE — Progress Notes (Signed)
Subjective:    Patient ID: [name], female    DOB: [birth year], 47 y.o.   MRN: [id number]  HPI  [name] is back regarding her chronic pain. She has been complaining of more muscle spasms in her low back. She has noticed more pain while she's been standing as well. 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 The robaxin doesn't appear to be providing much relief. She works minimally on her low back exercises. She attributes a lot of her leg pain to her "heart medications."  She feels that she's having more pain as a whole with increased headaches, arm pain, back pain noted. In speaking with her, she's trying to find work but there is nothing to do which she can physically tolerate. She has been told conversely that she does not qualify for disability. She has not worked with a Geologist, engineering to my knowledge.     Pain Inventory Average Pain 7 Pain Right Now 9 My pain is tingling and aching  In the last 24 hours, has pain interfered with the following? General activity 6 Relation with others 8 Enjoyment of life 8 What TIME of day is your pain at its worst? constant Sleep (in general) Fair  Pain is worse with: walking, bending, sitting, inactivity, standing and some activites Pain improves with: rest, pacing activities and medication Relief from Meds: 5  Mobility walk without assistance how many minutes can you walk? 10 ability to climb steps?  yes do you drive?  yes Do you have any goals in this area?  yes  Function disabled: date disabled 2013 Do you have any goals in this area?  yes  Neuro/Psych bladder control problems weakness numbness trouble walking dizziness confusion depression  Prior Studies Any changes since last visit?  no  Physicians involved in your care Any changes since last visit?  no   Family History  Problem Relation Age of Onset  . Heart disease  Mother 2    Died of MI at age 26 yo  . Heart disease Paternal Grandmother     requiring pacemaker.  [name] Heart disease Paternal Grandfather 42    Died of MI at possibly age 61-53yo  . Stroke Paternal Grandfather   . Heart disease Father 78    MI age 35yo requiring stenting  . Kidney disease Mother     requiring dialysis  . Congestive Heart Failure Mother   . Diabetes Father   . Diabetes Brother   . Heart disease Brother 17    MI at age 64 years old  . Breast cancer Paternal Aunt   . Breast cancer Maternal Grandmother    History   Social History  . Marital Status: Single    Spouse Name: N/A    Number of Children: 2  . Years of Education: 9th grade   Occupational History  . Unemployed     planning on getting disability   Social History Main Topics  . Smoking status: Never Smoker   . Smokeless tobacco: Never Used  . Alcohol Use: No  . Drug Use: No  . Sexually Active: Yes    Birth Control/ Protection: Surgical   Other Topics Concern  . None   Social History Narrative   Lives in [place] with her son. Is able to read and write fluently in [place].   Past Surgical History  Procedure Laterality Date  . Breast biopsies Bilateral  2011    patient reports benign results  . Tubal ligation  05/31/1985  . Hand surgery Left   . Shoulder surgery Right   . Dental surgery      tumors removed   . Cardiac catheterization  05/2011   Past Medical History  Diagnosis Date  . Hypertension     Poorly controlled. Has had HTN since age 51. Angioedema with ACEI.  24 Hr urine and renal arterial dopplers ordered . . . Never done  . Type II or unspecified type diabetes mellitus without mention of complication, uncontrolled DX: 2002  . NICM (nonischemic cardiomyopathy)     EF 45-50% in 8/12, cath 6/12 showed normal coronaries, EF 50-55% by LV gram  . Morbid obesity   . Allergic rhinitis   . HLD (hyperlipidemia)   . OSA on CPAP     sleep study in 8/12 showed moderate to severe OSA  requiring CPAP  . Chronic lower back pain     secondary to DJD, obsetiy, hip problems. Followed by Dr. Ivory Broad (pain management)  . 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 Chronic diastolic heart failure      Primarily diastolic CHF: Likely due to uncontrolled HTN. Last echo (8/12) with EF 45-50%, mild to moderate LVH with some asymmetric septal hypertrophy, RV normal size and systolic function. EF 50-55% by LV-gram in 6/12.   . Degeneration of lumbar or lumbosacral intervertebral disc   . Polyneuropathy in diabetes(357.2)   . Thoracic or lumbosacral neuritis or radiculitis, unspecified   . Calcifying tendinitis of shoulder   . LBBB (left bundle branch block)   . Chronic combined systolic and diastolic CHF (congestive heart failure)     EF 40-45% by echo 12/06/2012  . Coronary artery disease     questionable. LHC 05/2011 showing normal coronaries // Followed at [facility], Dr. Shirlee Latch  . GERD (gastroesophageal reflux disease)    BP 165/92  Pulse 107  Resp 16  Ht 5\' 9"  (1.753 m)  Wt 282 lb (127.914 kg)  BMI 41.63 kg/m2  SpO2 99%  LMP 06/17/2013     Review of Systems  HENT: Positive for neck pain.   Musculoskeletal: Positive for back pain and gait problem.  Neurological: Positive for tremors, weakness and numbness.  Psychiatric/Behavioral: Positive for confusion and dysphoric mood. The patient is nervous/anxious.   All other systems reviewed and are negative.       Objective:   Physical Exam Constitutional: She is oriented to person, place, and time. She appears well-developed and well-nourished.  obese  HENT:  Head: Normocephalic and atraumatic.  Nose: Nose normal.  Eyes: Conjunctivae and EOM are normal. Pupils are equal, round, and reactive to light.  Neck: Normal range of motion.  Cardiovascular: Normal rate and regular rhythm.  Pulmonary/Chest: Effort normal and breath sounds normal.  Abdominal: She exhibits no distension. There is no  tenderness.  Musculoskeletal: head forward posture. Left trap, splenius capitis are tight with tp's palpated. Patient had pain with all cervical movements particularly rotation and bending to the left. Left shoulder is painful with RTC maneuvers, apprehension test is negative.  Lumbar back: She exhibits decreased range of motion, tenderness, bony tenderness, pain and spasm particularly in the left lumbar paraspinals. She had difficulty flexing past 45 degrees and with extension beyond 15 degrees. Facet maneuvers caused pain today. Pain seem more pronounced on the right.  Trace to 1+ pitting edema both lower ext (decreased).  Mild joint  line tenderness laterally on the left. Meniscal maneuvers remain positive bilaterally. She has bilateral valgus deformities at each knee, right more than left.  Both calves are tender with stretching and slightly with palpation.    Neurological: She is alert and oriented to person, place, and time. She has normal strength and normal reflexes. A sensory deficit (stocking glove both legs) is present. She displays a negative Romberg sign.  Psychiatric: She has a normal mood and affect. Her behavior is normal. Judgment and thought content normal.   Assessment & Plan:   ASSESSMENT:  1. Chronic low back pain which is multifactorial. Recent MRI of the  back is notable for disc disease and facet hypertrophy. Most  involved level appeared to be L4-L5, where there is a shallow broad-  based disk herniation and facet arthropathy with mild stenosis.  2. Diabetic peripheral neuropathy.   3. Bilateral rotator cuff syndrome.  4. Morbid obesity.  5. Cardiomyopathy/HTN  6. 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  7. Cervical myofascial pain, left -sided predominantly   PLAN:  1. Continue on tegretol for neuropathic pain.   2. Percocet 10/325 was refilled one q8 prn. #60.  3. Reviewed flexion based exercises to assist facet sx and  muscle spasm.  We discussed the fact that there is no instant "cure" here. She needs to address this holistically from the areas of weight loss, exercise, distraction (work/social), medications, interventional.  4. She still needs to work on better glycemic control.  5. Cardiac mgt per Dr. Jearld Pies. We discussed the importance of exercise and weight loss as they pertain to her CV and pain problems. She is diuresing quite a bit  6. Continue  Voltaren gel for left knee.  7. I will see her back in 3 months' time. She will call me with any  problems or questions. 30 minutes of face to face patient care time were spent during this visit. All questions were encouraged and answered. Still can consider injection based therapies but I'm not overly optimistic that these will provide substantial relief given the multiple issues involved with her pain.

## 2013-06-25 NOTE — Patient Instructions (Signed)
Facet Syndrome Facet syndrome is a condition where injury to the small joints between the bones in the spine (facet joints) causes back pain. Over rotation (twisting) or arching (extension) of the back may injure the joints or the soft disks between the spinal bones. Such injuries result in excessive motion of the facet joint. This causes the cartilage covering the facet joint to wear down. That places pressure on nerves, as they exit the spinal cord.  SYMPTOMS   Chronic dull ache in the low back, that gets worse with over-extension and rotation.  Pain in the low back, buttocks, hip, and sometimes leg.  Sometimes, stiffness of the low back. CAUSES  Facet syndrome is often caused by repeated or over rotation, over-extension, or extension with rotation of the back. These motions cause injury to the cartilage covering the facet joints. This places pressure on the spinal nerves. RISK INCREASES WITH:  Sports that can cause over-extension of the back, with rotation or repeatedly (golf, football, gymnastics, diving, weight-lifting, dancing, rifle shooting, wrestling, tennis, swimming, volleyball, track and field, rugby, other contact sports).  Poor back strength and flexibility.  Poor exercise technique. PREVENTION   Learn and use proper technique.  Warm up and stretch properly before activity.  Maintain physical fitness:  Back and hamstring flexibility.  Back muscle strength and endurance.  Cardiovascular fitness. PROGNOSIS  This condition is often resolved with proper non-surgical treatment.  RELATED COMPLICATIONS   Recurring symptoms, resulting in a chronic problem.  Delayed healing, especially if sports are resumed too soon.  Prolonged impairment.  Narrowed canal for the spinal cord, due to bone spurs (bumps) resulting from chronic erosion of the facet joints (spinal stenosis). TREATMENT  Treatment first involves stopping activities that aggravate your symptoms. Ice and  medicines may be used to reduce pain and inflammation. Your caregiver may advise strength and stretching activities, to be completed at home or with a therapist. You may be referred to a physical therapist for further treatment, including: ultrasound, manual adjustments, transcutaneous electronic nerve stimulation (TENS). Surgery is rarely needed. It is reserved for athletes with persistent pain, despite 6 to 12 months of proper non-surgical treatment. Surgery involves joining (fusing) two bones of the spinal column, to stop motion between the facet joint and disk. MEDICATION   If pain medicine is needed, nonsteroidal anti-inflammatory medicines (aspirin and ibuprofen), or other minor pain relievers (acetaminophen), are often advised.  Do not take pain medicine for 7 days before surgery.  Stronger pain relievers may be prescribed. Use only as directed and only as much as you need. HEAT AND COLD  Cold treatment (icing) relieves pain and reduces inflammation. Cold treatment should be applied for 10 to 15 minutes every 2 to 3 hours, and immediately after activity that aggravates your symptoms. Use ice packs or an ice massage.  Heat treatment may be used before performing stretching and strengthening activities advised by your caregiver, physical therapist, or athletic trainer. Use a heat pack or a warm water soak. SEEK MEDICAL CARE IF:   Symptoms get worse or do not improve in 2 to 4 weeks, despite treatment.  You develop numbness, weakness, or loss of bladder or bowel function.  New, unexplained symptoms develop. (Drugs used in treatment may produce side effects.) Document Released: 11/29/2005 Document Revised: 02/21/2012 Document Reviewed: 03/13/2009 [facility] Patient Information 2014 [place], [place].   Facet Joint Block The facet joints connect the bones of the spine (vertebrae). They make it possible for you to bend, twist, and make other movements  with your spine. They also prevent you from  overbending, overtwisting, and making other excessive movements.  A facet joint block is a procedure where a numbing medicine (anesthetic) is injected into a facet joint. Often times, a type of anti-inflammatory medicine called a steroid is also injected. A facet joint block may be done for two reasons:   Diagnosis. A facet joint block may be done as a test to see whether neck or back pain is caused by a worn-down or infected facet joint. If the pain gets better after a facet joint block, it means the pain is probably coming from the facet joint. If the pain does not get better, it means the pain is probably not coming from the fact joint.   Therapy. A facet joint block may be done to relieve neck or back pain caused by a facet joint. A facet joint block is only done as a therapy if the pain does not improve with medicine, exercise programs, physical therapy, and other forms of pain management. LET YOUR CAREGIVER KNOW ABOUT:   Any allergies you have.   All medicines you are taking, including vitamins, herbs, eyedrops, and over-the-counter medicines and creams.   Previous problems you or members of your family have had with the use of anesthetics.   Any blood disorders you have had.   Other health problems you have. RISKS AND COMPLICATIONS Generally, having a facet joint block is safe. However, as with any procedure, complications can occur. Possible complications associated with having a facet joint block include:   Bleeding.   Injury to a nerve near the injection site.   Pain at the injection site.   Weakness or numbness in areas controlled by nerves near the injection site.   Infection.   Temporary fluid retention.   Allergic reaction to anesthetics or medicines used during the procedure. BEFORE THE PROCEDURE   Follow your caregiver's instructions if you are taking dietary supplements or medicines. You may need to stop taking them or reduce your dosage.   Do not  take any new dietary supplements or medicines without asking your caregiver first.   Follow your caregiver's instructions about eating and drinking before the procedure. You may need to stop eating and drinking several hours before the procedure.   Arrange to have an adult drive you home after the procedure. PROCEDURE  You may need to remove your clothing and dress in an open-back gown so that your caregiver can access your spine.   The procedure will be done while you are lying on an X-ray table. Most of the time you will be asked to lie on your stomach, but you may be asked to lie in a different position if an injection will be made in your neck.   Special machines will be used to monitor your oxygen levels, heart rate, and blood pressure.   If an injection will be made in your neck, an intravenous (IV) tube will be inserted into one of your veins. Fluids and medicine will flow directly into your body through the IV tube.   The area over the facet joint where the injection will be made will be cleaned with an antiseptic soap. The surrounding skin will be covered with sterile drapes.   An anesthetic will be applied to your skin to make the injection area numb. You may feel a temporary stinging or burning sensation.   A video X-ray machine will be used to locate the joint. A contrast dye may be injected  into the facet joint area to help with locating the joint.   When the joint is located, an anesthetic medicine will be injected into the joint through the needle.   Your caregiver will ask you whether you feel pain relief. If you do feel relief, a steroid may be injected to provide pain relief for a longer period of time. If you do not feel relief or feel only partial relief, additional injections of an anesthetic may be made in other facet joints.   The needle will be removed, the skin will be cleansed, and bandages will be applied.   The procedure usually takes less than a  half hour to perform. AFTER THE PROCEDURE   You will be observed for 15 30 minutes before being allowed to go home. Do not drive. Have an adult drive you or take a taxi or public transportation instead.   If you feel pain relief, the pain will return in several hours or days when the anesthetic wears off.   You may feel pain relief 2 14 days after the procedure. The amount of time this relief lasts varies from person to person.   It is normal to feel some tenderness over the injected area(s) for 2 days following the procedure.   If you have diabetes, you may have a temporary increase in blood sugar. Document Released: 04/20/2007 Document Revised: 11/15/2012 Document Reviewed: 09/18/2012 [facility] Patient Information 2014 [place], [place].

## 2013-06-26 ENCOUNTER — Telehealth: Payer: Self-pay | Admitting: *Deleted

## 2013-06-26 NOTE — Telephone Encounter (Signed)
Pharmacy request refill on KCL 10 meq.  Not on med list.  Has this med been d/c?

## 2013-06-27 NOTE — Telephone Encounter (Signed)
Yes, d/c potassium per Dr Shirlee Latch

## 2013-07-06 ENCOUNTER — Telehealth: Payer: Self-pay

## 2013-07-06 ENCOUNTER — Encounter: Payer: Self-pay | Admitting: Licensed Clinical Social Worker

## 2013-07-06 NOTE — Progress Notes (Signed)
Patient ID: [name], female   DOB: 01/11/1966, 47 y.o.   MRN: [id number] CSW received CMIS from [facility] regarding pt's need for DME.  Pt states unable to receive order for rollator and bedside commode from [facility].  CSW placed call to [facility], which state they did not receive order.  The order for this equipment did not come from our office but from Dr. Riley Kill.  CSW can only see order for bedside commode and wheeled walker on chart.  Will discuss with [facility]. CMIS message with [facility], care manager will f/u with Dr. Riley Kill for bedside commode and rollator.  Pt states she already has w/w.

## 2013-07-06 NOTE — Telephone Encounter (Signed)
Rn from [facility] called concerned that patient is having trouble getting a walker through [facility].

## 2013-07-09 ENCOUNTER — Ambulatory Visit (INDEPENDENT_AMBULATORY_CARE_PROVIDER_SITE_OTHER): Payer: Medicaid Other | Admitting: Nurse Practitioner

## 2013-07-09 ENCOUNTER — Encounter: Payer: Self-pay | Admitting: Nurse Practitioner

## 2013-07-09 VITALS — BP 140/90 | HR 68 | Ht 69.0 in | Wt 281.8 lb

## 2013-07-09 DIAGNOSIS — I5041 Acute combined systolic (congestive) and diastolic (congestive) heart failure: Secondary | ICD-10-CM

## 2013-07-09 NOTE — Patient Instructions (Addendum)
We are going to check labs today  For now, stay on your current medicines  We need to get your ultrasound of your heart updated  Restrict your salt use - nothing out of a can/box  See Dr. Shirlee Latch after your ultrasound for discussion  Call the [facility] office at [phone] if you have any questions, problems or concerns.

## 2013-07-09 NOTE — Progress Notes (Signed)
[name] Date of Birth: [birth year] Medical Record #[id number]  History of Present Illness: Ms. Culverhouse is seen back today for a work in visit/post hospital visit. Seen for Dr. Shirlee Latch. She has had diastolic CHF in the setting of poorly controlled HTN, noncompliance, LBBB, normal cath in 2012, negative renal dopplers in 2013, morbid obesity, depression (with past prolonged QT), diabetes, and OSA.   Was not seen here since last June. Was hospitalized back in May with acute on chronic combined systolic and diastolic CHF. EF from December was down to 40 to 45%. Was told to follow back up here.   Comes in today. Here with a friend. Says she "is suffering over her situation". More short of breath. More swelling in her belly. Says she can't do any activity without getting dyspnea. Then will have some palpitations. Wondering if she needs a stress test (normal coronaries noted in her chart). Remains obese. Weight is down 11 pounds since she was last here. Has some early satiety.   Current Outpatient Prescriptions  Medication Sig Dispense Refill  . albuterol (VENTOLIN HFA) 108 (90 BASE) MCG/ACT inhaler Inhale 2 puffs into the lungs every 6 (six) hours as needed for wheezing or shortness of breath. For wheezing  6.7 g  5  . aspirin EC 81 MG tablet Take 1 tablet (81 mg total) by mouth every morning.  30 tablet  5  . carbamazepine (TEGRETOL) 200 MG tablet Take 200-400 mg by mouth 2 (two) times daily. Take 1 pill in AM and 2 pills in PM      . citalopram (CELEXA) 20 MG tablet Take 1 tablet (20 mg total) by mouth every morning.  30 tablet  5  . cloNIDine (CATAPRES) 0.2 MG tablet Take 1 tablet (0.2 mg total) by mouth 2 (two) times daily.  60 tablet  4  . cyclobenzaprine (FLEXERIL) 10 MG tablet Take 1 tablet (10 mg total) by mouth 3 (three) times daily as needed for muscle spasms.  90 tablet  3  . furosemide (LASIX) 40 MG tablet Take 1 tablet (40 mg total) by mouth 2 (two) times daily.  60 tablet  5  .  insulin aspart (NOVOLOG FLEXPEN) 100 UNIT/ML injection Inject 7 Units into the skin 3 (three) times daily before meals.  1 vial  1  . insulin glargine (LANTUS SOLOSTAR) 100 UNIT/ML injection Inject 0.2 mLs (20 Units total) into the skin at bedtime. DX: 250.02. Will need 3 pens/month  3 pen  1  . Insulin Pen Needle 31G X 5 MM MISC Use to inject insulin 4 times a day  120 each  0  . metoprolol succinate (TOPROL-XL) 100 MG 24 hr tablet Take 1 tablet (100 mg total) by mouth daily. Take with or immediately following a meal.  30 tablet  5  . Misc. Devices (ROLLATOR) MISC Use daily for ambulation  1 each  0  . oxyCODONE-acetaminophen (PERCOCET) 10-325 MG per tablet Take 1 tablet by mouth every 8 (eight) hours as needed. For pain  60 tablet  0  . pantoprazole (PROTONIX) 40 MG tablet Take 1 tablet (40 mg total) by mouth daily.  30 tablet  5  . polyethylene glycol (MIRALAX / GLYCOLAX) packet Take 17 g by mouth daily as needed (constipation).  14 each  0  . promethazine (PHENERGAN) 25 MG tablet Take 1 tablet (25 mg total) by mouth every 6 (six) hours as needed for nausea.  15 tablet  0  . simvastatin (ZOCOR) 40 MG tablet  Take 1 tablet (40 mg total) by mouth daily at 6 PM.  30 tablet  11  . spironolactone (ALDACTONE) 25 MG tablet Take 1 tablet (25 mg total) by mouth daily.  30 tablet  5  . traMADol (ULTRAM) 50 MG tablet Take 1 tablet (50 mg total) by mouth every 6 (six) hours as needed for pain.  20 tablet  0  . diclofenac sodium (VOLTAREN) 1 % GEL Apply 2 g topically 4 (four) times daily as needed (for pain).      [name] ibuprofen (ADVIL,MOTRIN) 200 MG tablet Take 600 mg by mouth every 6 (six) hours as needed for pain.       No current facility-administered medications for this visit.    Allergies  Allergen Reactions  . Clindamycin Anaphylaxis and Swelling    Swelling of pain  . Enalapril Maleate Swelling  . Lisinopril Swelling    Past Medical History  Diagnosis Date  . Hypertension     Poorly  controlled. Has had HTN since age 37. Angioedema with ACEI.  24 Hr urine and renal arterial dopplers ordered . . . Never done  . Type II or unspecified type diabetes mellitus without mention of complication, uncontrolled DX: 2002  . NICM (nonischemic cardiomyopathy)     EF 45-50% in 8/12, cath 6/12 showed normal coronaries, EF 50-55% by LV gram  . Morbid obesity   . Allergic rhinitis   . HLD (hyperlipidemia)   . OSA on CPAP     sleep study in 8/12 showed moderate to severe OSA requiring CPAP  . Chronic lower back pain     secondary to DJD, obsetiy, hip problems. Followed by Dr. Ivory Broad (pain management)  . 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 Chronic diastolic heart failure      Primarily diastolic CHF: Likely due to uncontrolled HTN. Last echo (8/12) with EF 45-50%, mild to moderate LVH with some asymmetric septal hypertrophy, RV normal size and systolic function. EF 50-55% by LV-gram in 6/12.   . Degeneration of lumbar or lumbosacral intervertebral disc   . Polyneuropathy in diabetes(357.2)   . Thoracic or lumbosacral neuritis or radiculitis, unspecified   . Calcifying tendinitis of shoulder   . LBBB (left bundle branch block)   . Chronic combined systolic and diastolic CHF (congestive heart failure)     EF 40-45% by echo 12/06/2012  . Coronary artery disease     questionable. LHC 05/2011 showing normal coronaries // Followed at [facility], Dr. Shirlee Latch  . GERD (gastroesophageal reflux disease)     Past Surgical History  Procedure Laterality Date  . Breast biopsies Bilateral 2011    patient reports benign results  . Tubal ligation  05/31/1985  . Hand surgery Left   . Shoulder surgery Right   . Dental surgery      tumors removed   . Cardiac catheterization  05/2011    History  Smoking status  . Never Smoker   Smokeless tobacco  . Never Used    History  Alcohol Use No    Family History  Problem Relation Age of Onset  . Heart disease  Mother 46    Died of MI at age 28 yo  . Heart disease Paternal Grandmother     requiring pacemaker.  [name] Heart disease Paternal Grandfather 55    Died of MI at possibly age 69-53yo  . Stroke Paternal Grandfather   . Heart disease Father 36    MI  age 43yo requiring stenting  . Kidney disease Mother     requiring dialysis  . Congestive Heart Failure Mother   . Diabetes Father   . Diabetes Brother   . Heart disease Brother 74    MI at age 47 years old  . Breast cancer Paternal Aunt   . Breast cancer Maternal Grandmother     Review of Systems: The review of systems is per the HPI.  All other systems were reviewed and are negative.  Physical Exam: BP 140/90  Pulse 68  Ht 5\' 9"  (1.753 m)  Wt 281 lb 12.8 oz (127.824 kg)  BMI 41.6 kg/m2  LMP 06/17/2013 Patient is very pleasant and in no acute distress. She is morbidly obese. kin is warm and dry. Color is normal.  HEENT is unremarkable. Normocephalic/atraumatic. PERRL. Sclera are nonicteric. Neck is supple. No masses. No JVD. Lungs are clear. Cardiac exam shows a regular rate and rhythm. She has a S3. Abdomen is soft. Extremities are without significant edema in my opinion. Gait and ROM are intact. No gross neurologic deficits noted.  LABORATORY DATA: BNP pending  Lab Results  Component Value Date   WBC 11.8* 04/27/2013   HGB 12.4 04/27/2013   HCT 37.0 04/27/2013   PLT 243 04/27/2013   GLUCOSE 187* 06/06/2013   CHOL 194 12/07/2012   TRIG 224* 12/07/2012   HDL 47 12/07/2012   LDLDIRECT 110.9 08/27/2011   LDLCALC 102* 12/07/2012   ALT 14 04/01/2013   AST 16 04/01/2013   NA 137 06/06/2013   K 4.2 06/06/2013   CL 101 06/06/2013   CREATININE 0.86 06/06/2013   BUN 8 06/06/2013   CO2 27 06/06/2013   TSH 3.548 02/25/2013   INR 0.96 04/27/2013   HGBA1C 9.7 04/09/2013   MICROALBUR 1.03 02/04/2009   Echo Study Conclusions from December 2013  - Left ventricle: Technically very limited study. Dysynergy of the septum. Hypokinesis of the inferior  wall. The cavity size was normal. Wall thickness was increased in a pattern of moderate LVH. Systolic function was mildly to moderately reduced. The estimated ejection fraction was in the range of 40% to 45%. Doppler parameters are consistent with high ventricular filling pressure. - Pericardium, extracardiac: A trivial pericardial effusion was identified posterior to the heart. - Impressions: The images from August,2012 were also limited. I can not be sure that there is any significant difference.  Assessment / Plan: 1. Combined systolic/diastolic CHF - her weight is actually down 11 pounds. She however has an S3 on exam. I am going to check a BNP today. I have not changed her medicines yet but would consider hydralazine/nitrates (has had what sounds like angioedema with ACE). Need to get her back to see Dr. Shirlee Latch - will refer on the CHF clinic per his request. She has dyssynergy on the last echo (with LBBB) - may need to consider pacemaker intervention.    2. Obesity  3. HTN - blood pressure has actually improved with her current regimen. Compliance has been an issue in the past.   Patient is agreeable to this plan and will call if any problems develop in the interim.   Rosalio Macadamia, RN, ANP-C [facility] [address] [place], [place]  [address]

## 2013-07-10 LAB — BASIC METABOLIC PANEL
BUN: 7 mg/dL (ref 6–23)
CO2: 28 mEq/L (ref 19–32)
Calcium: 9.2 mg/dL (ref 8.4–10.5)
Chloride: 102 mEq/L (ref 96–112)
Creatinine, Ser: 0.9 mg/dL (ref 0.4–1.2)
GFR: 91.01 mL/min (ref >60.00)
Glucose, Bld: 156 mg/dL — ABNORMAL HIGH (ref 70–99)
Potassium: 3.7 mEq/L (ref 3.5–5.1)
Sodium: 139 mEq/L (ref 135–145)

## 2013-07-10 LAB — BRAIN NATRIURETIC PEPTIDE: Pro B Natriuretic peptide (BNP): 230 pg/mL — ABNORMAL HIGH (ref 0.0–100.0)

## 2013-07-12 ENCOUNTER — Other Ambulatory Visit: Payer: Self-pay | Admitting: *Deleted

## 2013-07-12 MED ORDER — INSULIN PEN NEEDLE 31G X 5 MM MISC: Status: DC

## 2013-07-12 NOTE — Telephone Encounter (Signed)
Pt seen in June and asked to return in 2 weeks but no appt give. Will refill and sch appt (either [facility] or long [facility] appt) in 2-4 weeks.

## 2013-07-13 ENCOUNTER — Telehealth: Payer: Self-pay

## 2013-07-13 NOTE — Telephone Encounter (Signed)
Left message for patient to call office regarding her walker.

## 2013-07-16 ENCOUNTER — Ambulatory Visit (HOSPITAL_COMMUNITY): Payer: Medicaid Other | Attending: Cardiology | Admitting: Radiology

## 2013-07-16 ENCOUNTER — Other Ambulatory Visit: Payer: Self-pay | Admitting: Internal Medicine

## 2013-07-16 DIAGNOSIS — I447 Left bundle-branch block, unspecified: Secondary | ICD-10-CM | POA: Insufficient documentation

## 2013-07-16 DIAGNOSIS — I5041 Acute combined systolic (congestive) and diastolic (congestive) heart failure: Secondary | ICD-10-CM

## 2013-07-16 DIAGNOSIS — R0602 Shortness of breath: Secondary | ICD-10-CM | POA: Insufficient documentation

## 2013-07-16 DIAGNOSIS — I10 Essential (primary) hypertension: Secondary | ICD-10-CM | POA: Insufficient documentation

## 2013-07-16 DIAGNOSIS — Z1231 Encounter for screening mammogram for malignant neoplasm of breast: Secondary | ICD-10-CM

## 2013-07-16 DIAGNOSIS — G4733 Obstructive sleep apnea (adult) (pediatric): Secondary | ICD-10-CM | POA: Insufficient documentation

## 2013-07-16 DIAGNOSIS — E669 Obesity, unspecified: Secondary | ICD-10-CM | POA: Insufficient documentation

## 2013-07-16 DIAGNOSIS — R079 Chest pain, unspecified: Secondary | ICD-10-CM | POA: Insufficient documentation

## 2013-07-16 DIAGNOSIS — I059 Rheumatic mitral valve disease, unspecified: Secondary | ICD-10-CM | POA: Insufficient documentation

## 2013-07-16 DIAGNOSIS — I509 Heart failure, unspecified: Secondary | ICD-10-CM | POA: Insufficient documentation

## 2013-07-16 DIAGNOSIS — E1149 Type 2 diabetes mellitus with other diabetic neurological complication: Secondary | ICD-10-CM | POA: Insufficient documentation

## 2013-07-16 DIAGNOSIS — E785 Hyperlipidemia, unspecified: Secondary | ICD-10-CM | POA: Insufficient documentation

## 2013-07-16 DIAGNOSIS — E1142 Type 2 diabetes mellitus with diabetic polyneuropathy: Secondary | ICD-10-CM | POA: Insufficient documentation

## 2013-07-16 NOTE — Progress Notes (Signed)
Echocardiogram performed.  

## 2013-07-19 ENCOUNTER — Ambulatory Visit (HOSPITAL_COMMUNITY)
Admission: RE | Admit: 2013-07-19 | Discharge: 2013-07-19 | Disposition: A | Discharge status: Home / Self Care | Payer: Medicaid Other | Source: Ambulatory Visit | Attending: Cardiology | Admitting: Cardiology

## 2013-07-19 ENCOUNTER — Encounter (HOSPITAL_COMMUNITY): Payer: Self-pay

## 2013-07-19 VITALS — BP 144/106 | HR 102 | Wt 280.1 lb

## 2013-07-19 DIAGNOSIS — I447 Left bundle-branch block, unspecified: Secondary | ICD-10-CM | POA: Insufficient documentation

## 2013-07-19 DIAGNOSIS — I10 Essential (primary) hypertension: Secondary | ICD-10-CM | POA: Insufficient documentation

## 2013-07-19 DIAGNOSIS — E119 Type 2 diabetes mellitus without complications: Secondary | ICD-10-CM | POA: Insufficient documentation

## 2013-07-19 DIAGNOSIS — I509 Heart failure, unspecified: Secondary | ICD-10-CM | POA: Insufficient documentation

## 2013-07-19 DIAGNOSIS — I5042 Chronic combined systolic (congestive) and diastolic (congestive) heart failure: Secondary | ICD-10-CM

## 2013-07-19 DIAGNOSIS — I5032 Chronic diastolic (congestive) heart failure: Secondary | ICD-10-CM | POA: Insufficient documentation

## 2013-07-19 DIAGNOSIS — R079 Chest pain, unspecified: Secondary | ICD-10-CM | POA: Insufficient documentation

## 2013-07-19 MED ORDER — ISOSORBIDE MONONITRATE ER 30 MG PO TB24: 30.0000 mg | ORAL_TABLET | Freq: Every day | ORAL | Status: DC

## 2013-07-19 MED ORDER — DIGOXIN 250 MCG PO TABS: 0.2500 mg | ORAL_TABLET | Freq: Every day | ORAL | Status: DC

## 2013-07-19 MED ORDER — HYDRALAZINE HCL 25 MG PO TABS: 25.0000 mg | ORAL_TABLET | Freq: Three times a day (TID) | ORAL | Status: DC

## 2013-07-19 NOTE — Addendum Note (Signed)
Encounter addended by: Theresia Bough, CMA on: 07/19/2013 12:34 PM<BR>     Documentation filed: Orders

## 2013-07-19 NOTE — Addendum Note (Signed)
Encounter addended by: Aundria Rud, NP on: 07/19/2013 11:36 AM<BR>     Documentation filed: Patient Instructions Section, Orders

## 2013-07-19 NOTE — Patient Instructions (Addendum)
Start taking hydralazine 25 mg three times a day.  Start taking IMDUR 30 mg daily.  Start taking digoxin 0.25 mg daily.  Will refer to the EP doctors for ICD.   F/U 2 weeks.  Call [phone] if any issues.  Do the following things EVERYDAY: 1) Weigh yourself in the morning before breakfast. Write it down and keep it in a log. 2) Take your medicines as prescribed 3) Eat low salt foods-Limit salt (sodium) to 2000 mg per day.  4) Stay as active as you can everyday 5) Limit all fluids for the day to less than 2 liters

## 2013-07-19 NOTE — Progress Notes (Signed)
Patient ID: [name], female   DOB: 16-Jun-1966, 47 y.o.   MRN: [id number]  Weight Range   Baseline proBNP    HPI: 47 yo with history of morbid obesity, DM2, OSA on CPAP and diastolic CHF in the setting of poorly-controlled HTN presents for cardiology followup. She was admitted in 6/12 with chest pain and new LBBB and ended up getting a left heart cath, which showed no angiographic CAD. Renal artery dopplers were normal in 5/13.   Was not seen here since last June. Was hospitalized back in May with acute on chronic combined systolic and diastolic CHF. EF from December was down to 40 to 45%. Was told to follow back up here.   Labs 7/14: proBNP 230, Cr 0.9, K 3.7 ECHO 12/13: 40-45% ECHO 8/14: 20% RV normal  Follow up: Here with friend. Reports that she gets real SOB with walking and chest is tight. +orthopnea and PND. Gets winded with walking 30-40 feet.  Weight at home 280-284. Taking medications as prescribed. Eating a low salt diet and drinking less than 2L a day. Wear CPAP at night. Mother died at 47 y/o due to HF.  Not on ACE due to angioedema (had with multiple agents). When she gets SOB she takes lasix 80 at one time and that helps. Weighs every day.   Says she has now been taking meds consistently for 3 months through [facility]. BMI 41   ROS: All systems negative except as listed in HPI, PMH and Problem List.  Past Medical History  Diagnosis Date  . Hypertension     Poorly controlled. Has had HTN since age 77. Angioedema with ACEI.  24 Hr urine and renal arterial dopplers ordered . . . Never done  . Type II or unspecified type diabetes mellitus without mention of complication, uncontrolled DX: 2002  . NICM (nonischemic cardiomyopathy)     EF 45-50% in 8/12, cath 6/12 showed normal coronaries, EF 50-55% by LV gram  . Morbid obesity   . Allergic rhinitis   . HLD (hyperlipidemia)   . OSA on CPAP     sleep study in 8/12 showed moderate to severe OSA requiring CPAP  . Chronic  lower back pain     secondary to DJD, obsetiy, hip problems. Followed by Dr. Ivory Broad (pain management)  . 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 Chronic diastolic heart failure      Primarily diastolic CHF: Likely due to uncontrolled HTN. Last echo (8/12) with EF 45-50%, mild to moderate LVH with some asymmetric septal hypertrophy, RV normal size and systolic function. EF 50-55% by LV-gram in 6/12.   . Degeneration of lumbar or lumbosacral intervertebral disc   . Polyneuropathy in diabetes(357.2)   . Thoracic or lumbosacral neuritis or radiculitis, unspecified   . Calcifying tendinitis of shoulder   . LBBB (left bundle branch block)   . Chronic combined systolic and diastolic CHF (congestive heart failure)     EF 40-45% by echo 12/06/2012  . Coronary artery disease     questionable. LHC 05/2011 showing normal coronaries // Followed at [facility], Dr. Shirlee Latch  . GERD (gastroesophageal reflux disease)     Current Outpatient Prescriptions  Medication Sig Dispense Refill  . albuterol (VENTOLIN HFA) 108 (90 BASE) MCG/ACT inhaler Inhale 2 puffs into the lungs every 6 (six) hours as needed for wheezing or shortness of breath. For wheezing  6.7 g  5  . aspirin EC 81 MG tablet  Take 1 tablet (81 mg total) by mouth every morning.  30 tablet  5  . carbamazepine (TEGRETOL) 200 MG tablet Take 200-400 mg by mouth 2 (two) times daily. Take 1 pill in AM and 2 pills in PM      . citalopram (CELEXA) 20 MG tablet Take 1 tablet (20 mg total) by mouth every morning.  30 tablet  5  . cloNIDine (CATAPRES) 0.2 MG tablet Take 1 tablet (0.2 mg total) by mouth 2 (two) times daily.  60 tablet  4  . cyclobenzaprine (FLEXERIL) 10 MG tablet Take 1 tablet (10 mg total) by mouth 3 (three) times daily as needed for muscle spasms.  90 tablet  3  . diclofenac sodium (VOLTAREN) 1 % GEL Apply 2 g topically 4 (four) times daily as needed (for pain).      . furosemide (LASIX) 40 MG tablet Take 1  tablet (40 mg total) by mouth 2 (two) times daily.  60 tablet  5  . ibuprofen (ADVIL,MOTRIN) 200 MG tablet Take 600 mg by mouth every 6 (six) hours as needed for pain.      [name] insulin aspart (NOVOLOG FLEXPEN) 100 UNIT/ML injection Inject 7 Units into the skin 3 (three) times daily before meals.  1 vial  1  . insulin glargine (LANTUS SOLOSTAR) 100 UNIT/ML injection Inject 0.2 mLs (20 Units total) into the skin at bedtime. DX: 250.02. Will need 3 pens/month  3 pen  1  . Insulin Pen Needle 31G X 5 MM MISC Use to inject insulin 4 times a day  120 each  0  . metoprolol succinate (TOPROL-XL) 100 MG 24 hr tablet Take 1 tablet (100 mg total) by mouth daily. Take with or immediately following a meal.  30 tablet  5  . oxyCODONE-acetaminophen (PERCOCET) 10-325 MG per tablet Take 1 tablet by mouth every 8 (eight) hours as needed. For pain  60 tablet  0  . pantoprazole (PROTONIX) 40 MG tablet Take 1 tablet (40 mg total) by mouth daily.  30 tablet  5  . polyethylene glycol (MIRALAX / GLYCOLAX) packet Take 17 g by mouth daily as needed (constipation).  14 each  0  . promethazine (PHENERGAN) 25 MG tablet Take 1 tablet (25 mg total) by mouth every 6 (six) hours as needed for nausea.  15 tablet  0  . simvastatin (ZOCOR) 40 MG tablet Take 1 tablet (40 mg total) by mouth daily at 6 PM.  30 tablet  11  . spironolactone (ALDACTONE) 25 MG tablet Take 1 tablet (25 mg total) by mouth daily.  30 tablet  5  . traMADol (ULTRAM) 50 MG tablet Take 1 tablet (50 mg total) by mouth every 6 (six) hours as needed for pain.  20 tablet  0  . Misc. Devices (ROLLATOR) MISC Use daily for ambulation  1 each  0   No current facility-administered medications for this encounter.     PHYSICAL EXAM: Filed Vitals:   07/19/13 1039  BP: 144/106  Pulse: 102  Weight: 280 lb 1.9 oz (127.062 kg)  SpO2: 99%    General:  Fatigued appearing. No resp difficulty HEENT: normal Neck: supple. Thick. Hard to see JVP appearsflat. Carotids 2+  bilaterally; no bruits. No lymphadenopathy appreciated. ? goiter Cor: PMI nonpalpable Regular rate & rhythm. No rubs, gallops or murmurs. Split s2 Lungs: clear Abdomen: obese soft, nontender, nondistended. No hepatosplenomegaly. No bruits or masses. Good bowel sounds. Extremities: no cyanosis, clubbing, rash, edema Neuro: alert & orientedx3, cranial nerves  grossly intact. Moves all 4 extremities w/o difficulty. Affect pleasant.   ASSESSMENT:  1. Chronic systolic HF    --LVEF 20%.    --NYHA III-IIIB 2. Uncontrolled HTN 3. Morbid obesity 4. DM2 5. OSA on CPAP 6. H/o noncompliance 7. LBBB 8. Angioedema with ACE-I  PLAN:  Attending: Patient seen and examined with Ulla Potash, NP. We discussed all aspects of the encounter. I agree with the assessment and plan as stated above.   She has severe, progressive LV dysfunction with NYHA III-IIIB symptoms. Volume status looks ok.  In clinic today I showed her her echo images personally and quoted her a 50% mortality rate at one year. I stressed need to be compliant with medicines and follow-up as well as daily weights. Today we will add hydralazine 25 tid and imdur 30 daily as well as digoxin 0.25 daily. Given that she has taken her meds for 3 months now will refer to EP for CRT-D. Will not use ACE/ARB due to angioedema with multiple meds. Will eventually want to wean clonidine off and switch Toprol to carvedilol.   See back in 2 weeks.   Total time spent 70 minutes with over half that time in direct discussions about above.  Daniel Bensimhon,MD 11:28 AM

## 2013-07-23 ENCOUNTER — Encounter (HOSPITAL_COMMUNITY): Payer: Self-pay | Admitting: Internal Medicine

## 2013-07-23 ENCOUNTER — Telehealth: Payer: Self-pay

## 2013-07-23 DIAGNOSIS — M4716 Other spondylosis with myelopathy, lumbar region: Secondary | ICD-10-CM

## 2013-07-23 DIAGNOSIS — E1140 Type 2 diabetes mellitus with diabetic neuropathy, unspecified: Secondary | ICD-10-CM

## 2013-07-23 DIAGNOSIS — M47816 Spondylosis without myelopathy or radiculopathy, lumbar region: Secondary | ICD-10-CM

## 2013-07-23 DIAGNOSIS — M1712 Unilateral primary osteoarthritis, left knee: Secondary | ICD-10-CM

## 2013-07-23 DIAGNOSIS — E1142 Type 2 diabetes mellitus with diabetic polyneuropathy: Secondary | ICD-10-CM

## 2013-07-23 MED ORDER — OXYCODONE-ACETAMINOPHEN 10-325 MG PO TABS: 1.0000 | ORAL_TABLET | Freq: Three times a day (TID) | ORAL | Status: DC | PRN

## 2013-07-23 NOTE — Telephone Encounter (Addendum)
Called for refill for oxycodone and needs a new prescription for rollator walker. Last refill was 06/25/13 next appt is scheduled for October( 3 month follow up) RX printed for MD to sign. Rolling Dan Humphreys has been ordered and faxed to [facility] previously.

## 2013-07-24 ENCOUNTER — Ambulatory Visit (INDEPENDENT_AMBULATORY_CARE_PROVIDER_SITE_OTHER): Payer: Medicaid Other | Admitting: Internal Medicine

## 2013-07-24 VITALS — BP 138/96 | HR 100 | Temp 97.5°F | Ht 69.0 in | Wt 280.2 lb

## 2013-07-24 DIAGNOSIS — E785 Hyperlipidemia, unspecified: Secondary | ICD-10-CM

## 2013-07-24 DIAGNOSIS — I5032 Chronic diastolic (congestive) heart failure: Secondary | ICD-10-CM

## 2013-07-24 DIAGNOSIS — E1142 Type 2 diabetes mellitus with diabetic polyneuropathy: Secondary | ICD-10-CM

## 2013-07-24 DIAGNOSIS — I10 Essential (primary) hypertension: Secondary | ICD-10-CM

## 2013-07-24 DIAGNOSIS — I5042 Chronic combined systolic (congestive) and diastolic (congestive) heart failure: Secondary | ICD-10-CM

## 2013-07-24 DIAGNOSIS — E1140 Type 2 diabetes mellitus with diabetic neuropathy, unspecified: Secondary | ICD-10-CM

## 2013-07-24 DIAGNOSIS — E1149 Type 2 diabetes mellitus with other diabetic neurological complication: Secondary | ICD-10-CM

## 2013-07-24 DIAGNOSIS — I509 Heart failure, unspecified: Secondary | ICD-10-CM

## 2013-07-24 LAB — GLUCOSE, CAPILLARY: Glucose-Capillary: 231 mg/dL — ABNORMAL HIGH (ref 70–99)

## 2013-07-24 MED ORDER — SIMVASTATIN 40 MG PO TABS: 40.0000 mg | ORAL_TABLET | Freq: Every day | ORAL | Status: DC

## 2013-07-24 NOTE — Progress Notes (Signed)
Patient ID: [name], female   DOB: Feb 16, 1966, 47 y.o.   MRN: [id number]          HPI: [name] is a 47 y.o. woman with past medical history of systolic CHF, hypertension, obesity, hyperlipidemia, obstructive sleep apnea, and type 2 diabetes, presents to the clinic for followup visit.  The main focus of the visit was on her diabetes. The patient has a prescription in epic of Lantus 20 units at bedtime with NovoLog 7 units 3 times a day. She reports that she has not taken any insulin over the last 2 weeks since she ran out. She did not call for a prescription. She also reports that she has not been checking her blood sugar after she forgot her glucometer at a family reunion last weekend. Today, his A1c is 9.2% from 9.7 on 04/09/2013. During the interview, the patient was tearful, reporting significant stress in her life, inability to afford her medications and her recent poor prognosis from a heart failure. Patient's EF is 20% and plan is to have a CRT-D. She also feels burnt out while seeking medical care due to seeing multiple physicians over the last several weeks. She has not met her new PCP, Dr Yetta Barre. Patient states that she will need a lot of assistance in terms of home health services. I encouraged her, that she'll receive the needed assistance from this clinic. Otherwise, the patient reports compliance to her other medications. She was recently evaluated by Dr. Fletcher Anon, and and started her on hydralazine, and digoxin. She brings in all other medications, except insulin.  Past Medical History  Diagnosis Date  . Hypertension     Poorly controlled. Has had HTN since age 65. Angioedema with ACEI.  24 Hr urine and renal arterial dopplers ordered . . . Never done  . Type II or unspecified type diabetes mellitus without mention of complication, uncontrolled DX: 2002  . NICM (nonischemic cardiomyopathy)     EF 45-50% in 8/12, cath 6/12 showed normal coronaries, EF 50-55% by LV gram   . Morbid obesity   . Allergic rhinitis   . HLD (hyperlipidemia)   . OSA on CPAP     sleep study in 8/12 showed moderate to severe OSA requiring CPAP  . Chronic lower back pain     secondary to DJD, obsetiy, hip problems. Followed by Dr. Ivory Broad (pain management)  . 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 Chronic diastolic heart failure      Primarily diastolic CHF: Likely due to uncontrolled HTN. Last echo (8/12) with EF 45-50%, mild to moderate LVH with some asymmetric septal hypertrophy, RV normal size and systolic function. EF 50-55% by LV-gram in 6/12.   . Degeneration of lumbar or lumbosacral intervertebral disc   . Polyneuropathy in diabetes(357.2)   . Thoracic or lumbosacral neuritis or radiculitis, unspecified   . Calcifying tendinitis of shoulder   . LBBB (left bundle branch block)   . Chronic combined systolic and diastolic CHF (congestive heart failure)     EF 40-45% by echo 12/06/2012  . Coronary artery disease     questionable. LHC 05/2011 showing normal coronaries // Followed at [facility], Dr. Shirlee Latch  . GERD (gastroesophageal reflux disease)    Current Outpatient Prescriptions  Medication Sig Dispense Refill  . albuterol (VENTOLIN HFA) 108 (90 BASE) MCG/ACT inhaler Inhale 2 puffs into the lungs every 6 (six) hours as needed for wheezing or shortness of breath. For  wheezing  6.7 g  5  . aspirin EC 81 MG tablet Take 1 tablet (81 mg total) by mouth every morning.  30 tablet  5  . citalopram (CELEXA) 20 MG tablet Take 1 tablet (20 mg total) by mouth every morning.  30 tablet  5  . cloNIDine (CATAPRES) 0.2 MG tablet Take 1 tablet (0.2 mg total) by mouth 2 (two) times daily.  60 tablet  4  . cyclobenzaprine (FLEXERIL) 10 MG tablet Take 1 tablet (10 mg total) by mouth 3 (three) times daily as needed for muscle spasms.  90 tablet  3  . diclofenac sodium (VOLTAREN) 1 % GEL Apply 2 g topically 4 (four) times daily as needed (for pain).      [name]  digoxin (LANOXIN) 0.25 MG tablet Take 1 tablet (0.25 mg total) by mouth daily.  30 tablet  6  . furosemide (LASIX) 40 MG tablet Take 1 tablet (40 mg total) by mouth 2 (two) times daily.  60 tablet  5  . hydrALAZINE (APRESOLINE) 25 MG tablet Take 1 tablet (25 mg total) by mouth 3 (three) times daily.  90 tablet  6  . ibuprofen (ADVIL,MOTRIN) 200 MG tablet Take 600 mg by mouth every 6 (six) hours as needed for pain.      [name] insulin aspart (NOVOLOG FLEXPEN) 100 UNIT/ML injection Inject 7 Units into the skin 3 (three) times daily before meals.  1 vial  1  . insulin glargine (LANTUS SOLOSTAR) 100 UNIT/ML injection Inject 0.2 mLs (20 Units total) into the skin at bedtime. DX: 250.02. Will need 3 pens/month  3 pen  1  . Insulin Pen Needle 31G X 5 MM MISC Use to inject insulin 4 times a day  120 each  0  . isosorbide mononitrate (IMDUR) 30 MG 24 hr tablet Take 1 tablet (30 mg total) by mouth daily.  30 tablet  6  . metoprolol succinate (TOPROL-XL) 100 MG 24 hr tablet Take 1 tablet (100 mg total) by mouth daily. Take with or immediately following a meal.  30 tablet  5  . Misc. Devices (ROLLATOR) MISC Use daily for ambulation  1 each  0  . oxyCODONE-acetaminophen (PERCOCET) 10-325 MG per tablet Take 1 tablet by mouth every 8 (eight) hours as needed. For pain  60 tablet  0  . pantoprazole (PROTONIX) 40 MG tablet Take 1 tablet (40 mg total) by mouth daily.  30 tablet  5  . polyethylene glycol (MIRALAX / GLYCOLAX) packet Take 17 g by mouth daily as needed (constipation).  14 each  0  . simvastatin (ZOCOR) 40 MG tablet Take 1 tablet (40 mg total) by mouth daily at 6 PM.  30 tablet  11  . spironolactone (ALDACTONE) 25 MG tablet Take 1 tablet (25 mg total) by mouth daily.  30 tablet  5  . traMADol (ULTRAM) 50 MG tablet Take 1 tablet (50 mg total) by mouth every 6 (six) hours as needed for pain.  20 tablet  0   No current facility-administered medications for this visit.   Family History  Problem Relation Age of  Onset  . Heart disease Mother 19    Died of MI at age 15 yo  . Heart disease Paternal Grandmother     requiring pacemaker.  [name] Heart disease Paternal Grandfather 37    Died of MI at possibly age 26-53yo  . Stroke Paternal Grandfather   . Heart disease Father 10    MI age 44yo requiring stenting  .  Kidney disease Mother     requiring dialysis  . Congestive Heart Failure Mother   . Diabetes Father   . Diabetes Brother   . Heart disease Brother 51    MI at age 32 years old  . Breast cancer Paternal Aunt   . Breast cancer Maternal Grandmother    History   Social History  . Marital Status: Single    Spouse Name: N/A    Number of Children: 2  . Years of Education: 9th grade   Occupational History  . Unemployed     planning on getting disability   Social History Main Topics  . Smoking status: Never Smoker   . Smokeless tobacco: Never Used  . Alcohol Use: No  . Drug Use: No  . Sexually Active: Yes    Birth Control/ Protection: Surgical   Other Topics Concern  . Not on file   Social History Narrative   Lives in [place] with her son. Is able to read and write fluently in [place].    Review of Systems: Constitutional: Denies fever, chills, diaphoresis, appetite change and fatigue.  Respiratory: Denies SOB, DOE, cough, chest tightness, and wheezing. She reports that her weight has been stable at around 280. She does not check her weights regularly. Cardiovascular: No chest pain, palpitations and leg swelling.  Gastrointestinal: No abdominal pain, nausea, vomiting, bloody stools Genitourinary: No dysuria, frequency, hematuria, or flank pain.    Objective:  Physical Exam: Filed Vitals:   07/24/13 1006  BP: 138/96  Pulse: 100  Temp: 97.5 F (36.4 C)  TempSrc: Oral  Height: 5\' 9"  (1.753 m)  Weight: 280 lb 3.2 oz (127.098 kg)  SpO2: 99%   General: Tearful, obese, no acute distress.  Lungs: CTA bilaterally. Heart: RRR; no extra sounds or murmurs. No  JVD. Abdomen: Non-distended, normal BS, soft, nontender; no hepatosplenomegaly  Extremities: No pedal edema. No joint swelling or tenderness. Neurologic: Alert and oriented x3. No obvious neurologic deficits.  Assessment & Plan:  I have discussed my assessment and plan  with Dr. Aundria Rud  as detailed under problem based charting.

## 2013-07-24 NOTE — Assessment & Plan Note (Addendum)
Lab Results  Component Value Date   HGBA1C 9.2 07/24/2013   HGBA1C 9.7 04/09/2013   HGBA1C 11.4* 12/07/2012     Assessment: Diabetes control: poor control (HgbA1C >9%) Progress toward A1C goal:  improved Comments:   Plan: Medications:  I did not change her medications at this time. I encouraged her to continue with her Lantus 20 units at bedtime. Patient seemed confused about her insulin dose and she does not know the specific insulin types that she takes. Home glucose monitoring: Frequency: 3 times a day Timing: before breakfast;before lunch;before dinner Instruction/counseling given: reminded to bring blood glucose meter & log to each visit Educational resources provided: brochure Self management tools provided:   Other plans: referral to [name] ( I will send a message to [name]). Patient will require a lot of education about diabetes, and medication compliance. She reports that she recently has been talking to someone from [facility] and she hopes, that she'll get a home health RN. She will follow up in 1-2 weeks.

## 2013-07-24 NOTE — Patient Instructions (Signed)
Please bring your medication including insulin on your next visit I will try to make sure your new primary doctor so that your care is well coordinated  Please take you Lantus insulin 20 units at bedtime.   Treatment Goals:  Goals (1 Years of Data) as of 07/24/13   None      Progress Toward Treatment Goals:  Treatment Goal 07/24/2013  Hemoglobin A1C unchanged  Blood pressure -    Self Care Goals & Plans:  Self Care Goal 07/24/2013  Manage my medications bring my medications to every visit; refill my medications on time  Eat healthy foods -    Home Blood Glucose Monitoring 07/24/2013  Check my blood sugar 3 times a day  When to check my blood sugar before breakfast; before lunch; before dinner     Care Management & Community Referrals:  Referral 04/09/2013  Referrals made for care management support diabetes educator

## 2013-07-24 NOTE — Telephone Encounter (Signed)
Left message informing patient oxycodone script is ready and we have [facility].

## 2013-07-24 NOTE — Assessment & Plan Note (Signed)
Appears euvolemic at this time without signs of fluid overload.. Wt is stable at 280. Following with HF clinic.

## 2013-07-24 NOTE — Assessment & Plan Note (Signed)
BP Readings from Last 3 Encounters:  07/24/13 138/96  07/19/13 144/106  07/09/13 140/90    Lab Results  Component Value Date   NA 139 07/09/2013   K 3.7 07/09/2013   CREATININE 0.9 07/09/2013    Assessment: Blood pressure control: controlled Progress toward BP goal:  at goal   Plan: Medications:  continue current medications Educational resources provided: brochure Self management tools provided:   Other plans: managed by her cardiologist

## 2013-07-25 NOTE — Progress Notes (Signed)
Case discussed with Dr. Kazibwe soon after the resident saw the patient.  We reviewed the resident's history and exam and pertinent patient test results.  I agree with the assessment, diagnosis, and plan of care documented in the resident's note. 

## 2013-07-27 ENCOUNTER — Encounter: Payer: Self-pay | Admitting: Pulmonary Disease

## 2013-07-27 ENCOUNTER — Ambulatory Visit (INDEPENDENT_AMBULATORY_CARE_PROVIDER_SITE_OTHER): Payer: Medicaid Other | Admitting: Pulmonary Disease

## 2013-07-27 VITALS — BP 124/90 | HR 95 | Temp 97.7°F | Ht 69.0 in | Wt 275.0 lb

## 2013-07-27 DIAGNOSIS — G4733 Obstructive sleep apnea (adult) (pediatric): Secondary | ICD-10-CM

## 2013-07-27 NOTE — Patient Instructions (Addendum)
Will get your cpap machine set on auto setting. Will have your equipment company show you the newer masks to see if more comfortable. Keep working on weight loss.  You are making progress. followup with me in 8 weeks, but call if having tolerance issues.

## 2013-07-27 NOTE — Assessment & Plan Note (Signed)
The patient has been noncompliant with CPAP, but has an underlying cardiomyopathy which can be greatly affected by sleep disordered breathing.  I stressed to her the importance of staying on her device, and will make some adjustments to her pressure setting and possibly get her a new mass to see if this will help with her compliance.  The most important part is for her to make a commitment to something that potentially could improve her cardiovascular health.  I've also encouraged her to work aggressively on weight loss.

## 2013-07-27 NOTE — Progress Notes (Signed)
  Subjective:    Patient ID: [name], female    DOB: [birth year], 47 y.o.   MRN: [id number]  HPI Patient comes in today for followup of her obstructive sleep apnea.  She has not been seen since October of last year, and unfortunately has been wearing her CPAP sporadically during this time.  She has lost 15 pounds since last visit, but also has had issues with a cardiomyopathy.  I have stressed to her again the importance of staying compliant with her CPAP with her underlying heart disease.   Review of Systems  Constitutional: Negative for fever and unexpected weight change.  HENT: Negative for ear pain, nosebleeds, congestion, sore throat, rhinorrhea, sneezing, trouble swallowing, dental problem, postnasal drip and sinus pressure.   Eyes: Negative for redness and itching.  Respiratory: Positive for cough, chest tightness, shortness of breath and wheezing.   Cardiovascular: Positive for chest pain and palpitations. Negative for leg swelling.  Gastrointestinal: Negative for nausea and vomiting.  Genitourinary: Negative for dysuria.  Musculoskeletal: Negative for joint swelling.  Skin: Negative for rash.  Neurological: Negative for headaches.  Hematological: Does not bruise/bleed easily.  Psychiatric/Behavioral: Positive for dysphoric mood. The patient is nervous/anxious.        Objective:   Physical Exam Obese female in no acute distress Nose without purulent discharge noted No skin breakdown or pressure necrosis from the CPAP mask Neck without lymphadenopathy or thyromegaly Lower extremities without edema, cyanosis Alert, oriented, moves all 4 extremities.        Assessment & Plan:

## 2013-07-30 ENCOUNTER — Other Ambulatory Visit: Payer: Self-pay | Admitting: Dietician

## 2013-07-30 ENCOUNTER — Ambulatory Visit (HOSPITAL_COMMUNITY)
Admission: RE | Admit: 2013-07-30 | Discharge: 2013-07-30 | Disposition: A | Discharge status: Home / Self Care | Payer: Medicaid Other | Source: Ambulatory Visit | Attending: Internal Medicine | Admitting: Internal Medicine

## 2013-07-30 DIAGNOSIS — E1140 Type 2 diabetes mellitus with diabetic neuropathy, unspecified: Secondary | ICD-10-CM

## 2013-07-30 DIAGNOSIS — Z1231 Encounter for screening mammogram for malignant neoplasm of breast: Secondary | ICD-10-CM

## 2013-07-30 NOTE — Progress Notes (Signed)
Signed.

## 2013-08-01 ENCOUNTER — Encounter (HOSPITAL_COMMUNITY): Payer: Self-pay

## 2013-08-01 ENCOUNTER — Ambulatory Visit (HOSPITAL_COMMUNITY)
Admission: RE | Admit: 2013-08-01 | Discharge: 2013-08-01 | Disposition: A | Discharge status: Home / Self Care | Payer: Medicaid Other | Source: Ambulatory Visit | Attending: Internal Medicine | Admitting: Internal Medicine

## 2013-08-01 ENCOUNTER — Other Ambulatory Visit: Payer: Self-pay | Admitting: Cardiology

## 2013-08-01 ENCOUNTER — Ambulatory Visit (INDEPENDENT_AMBULATORY_CARE_PROVIDER_SITE_OTHER): Payer: Medicaid Other | Admitting: Internal Medicine

## 2013-08-01 ENCOUNTER — Encounter: Payer: Self-pay | Admitting: Internal Medicine

## 2013-08-01 VITALS — BP 146/93 | HR 110 | Ht 69.0 in | Wt 273.0 lb

## 2013-08-01 VITALS — BP 144/98 | HR 113 | Wt 274.8 lb

## 2013-08-01 DIAGNOSIS — Z794 Long term (current) use of insulin: Secondary | ICD-10-CM | POA: Insufficient documentation

## 2013-08-01 DIAGNOSIS — I447 Left bundle-branch block, unspecified: Secondary | ICD-10-CM

## 2013-08-01 DIAGNOSIS — Z79899 Other long term (current) drug therapy: Secondary | ICD-10-CM | POA: Insufficient documentation

## 2013-08-01 DIAGNOSIS — E119 Type 2 diabetes mellitus without complications: Secondary | ICD-10-CM | POA: Insufficient documentation

## 2013-08-01 DIAGNOSIS — J309 Allergic rhinitis, unspecified: Secondary | ICD-10-CM | POA: Insufficient documentation

## 2013-08-01 DIAGNOSIS — E1142 Type 2 diabetes mellitus with diabetic polyneuropathy: Secondary | ICD-10-CM | POA: Insufficient documentation

## 2013-08-01 DIAGNOSIS — M1610 Unilateral primary osteoarthritis, unspecified hip: Secondary | ICD-10-CM | POA: Insufficient documentation

## 2013-08-01 DIAGNOSIS — E049 Nontoxic goiter, unspecified: Secondary | ICD-10-CM

## 2013-08-01 DIAGNOSIS — M7530 Calcific tendinitis of unspecified shoulder: Secondary | ICD-10-CM | POA: Insufficient documentation

## 2013-08-01 DIAGNOSIS — I428 Other cardiomyopathies: Secondary | ICD-10-CM

## 2013-08-01 DIAGNOSIS — M51379 Other intervertebral disc degeneration, lumbosacral region without mention of lumbar back pain or lower extremity pain: Secondary | ICD-10-CM | POA: Insufficient documentation

## 2013-08-01 DIAGNOSIS — E785 Hyperlipidemia, unspecified: Secondary | ICD-10-CM | POA: Insufficient documentation

## 2013-08-01 DIAGNOSIS — I5022 Chronic systolic (congestive) heart failure: Secondary | ICD-10-CM | POA: Insufficient documentation

## 2013-08-01 DIAGNOSIS — I10 Essential (primary) hypertension: Secondary | ICD-10-CM

## 2013-08-01 DIAGNOSIS — I5042 Chronic combined systolic (congestive) and diastolic (congestive) heart failure: Secondary | ICD-10-CM

## 2013-08-01 DIAGNOSIS — Z7982 Long term (current) use of aspirin: Secondary | ICD-10-CM | POA: Insufficient documentation

## 2013-08-01 DIAGNOSIS — E1149 Type 2 diabetes mellitus with other diabetic neurological complication: Secondary | ICD-10-CM | POA: Insufficient documentation

## 2013-08-01 DIAGNOSIS — E669 Obesity, unspecified: Secondary | ICD-10-CM

## 2013-08-01 DIAGNOSIS — M5137 Other intervertebral disc degeneration, lumbosacral region: Secondary | ICD-10-CM | POA: Insufficient documentation

## 2013-08-01 DIAGNOSIS — M169 Osteoarthritis of hip, unspecified: Secondary | ICD-10-CM | POA: Insufficient documentation

## 2013-08-01 DIAGNOSIS — G4733 Obstructive sleep apnea (adult) (pediatric): Secondary | ICD-10-CM

## 2013-08-01 DIAGNOSIS — I2510 Atherosclerotic heart disease of native coronary artery without angina pectoris: Secondary | ICD-10-CM | POA: Insufficient documentation

## 2013-08-01 DIAGNOSIS — K219 Gastro-esophageal reflux disease without esophagitis: Secondary | ICD-10-CM | POA: Insufficient documentation

## 2013-08-01 MED ORDER — METOPROLOL SUCCINATE ER 100 MG PO TB24: ORAL_TABLET | ORAL | Status: DC

## 2013-08-01 NOTE — Assessment & Plan Note (Signed)
The patient has worsening nonischemic cardiomyopathy despite compliance with guideline directed medical therapy. Notably she is not intolerant of ACE/ARB secondary to angioedema but she is otherwise on triple therapy with beta blockers, Aldactone, and hydralazine nitrates.  She is class IIIB heart failure. This is further complicated by obesity and obstructive sleep apnea.  Is appropriate at this time to consider CRT D. Implantation. Given the breadth of a QRS and a nonischemic cause, it is likely that she is a 70-75% chance of an a CRT-D implantation. I've told her and her sister was there is approximately 95% chance that we can deploy LV lead.  We have further reviewed the implications of sudden death risk of the importance of medication compliance.  With her sleep apnea and her back issues, I think the most appropriate to undertake the procedure with a support of   anesthesia  Have reviewed the potential benefits and risks of ICD implantation including but not limited to death, perforation of heart or lung, lead dislodgement, infection,  device malfunction and inappropriate shocks.  The patient and family express understanding  and are willing to proceed.

## 2013-08-01 NOTE — Assessment & Plan Note (Signed)
As above.

## 2013-08-01 NOTE — Progress Notes (Signed)
Patient ID: [name], female   DOB: [birth year], 47 y.o.   MRN: [id number]   Weight Range   Baseline proBNP   EP: Dr Graciela Husbands PCP: Dr Yetta Barre HPI: 89 yo with history of morbid obesity, DM2, OSA on CPAP and diastolic CHF in the setting of poorly-controlled HTN presents for cardiology followup. She was admitted in 6/12 with chest pain and new LBBB and ended up getting a left heart cath, which showed no angiographic CAD. Renal artery dopplers were normal in 5/13.   Was not seen here since last June 2013. Was hospitalized back in May with acute on chronic combined systolic and diastolic CHF. EF from December was down to 40 to 45%. Was told to follow back up here.   Labs 7/14: proBNP 230, Cr 0.9, K 3.7 ECHO 12/13: 40-45% ECHO 8/14: 20% RV normal  She returns for follow up. Last visit hydralazine 25 tid/ imdur 30 daily and digoxin added. Today Dr Graciela Husbands evaluated and plans for CRT-D and he increased metoprolol to 150 mg daily. Says she is stressed over living situation. SOB with exertion. + Orthopnea. Denies PND. Using CPAP nightly. Weight at home 273-274 pounds. Complaint with medications.     ROS: All systems negative except as listed in HPI, PMH and Problem List.  Past Medical History  Diagnosis Date  . Hypertension     Poorly controlled. Has had HTN since age 50. Angioedema with ACEI.  24 Hr urine and renal arterial dopplers ordered . . . Never done  . Type II or unspecified type diabetes mellitus without mention of complication, uncontrolled DX: 2002  . NICM (nonischemic cardiomyopathy)     EF 45-50% in 8/12, cath 6/12 showed normal coronaries, EF 50-55% by LV gram  . Morbid obesity   . Allergic rhinitis   . HLD (hyperlipidemia)   . OSA on CPAP     sleep study in 8/12 showed moderate to severe OSA requiring CPAP  . Chronic lower back pain     secondary to DJD, obsetiy, hip problems. Followed by Dr. Ivory Broad (pain management)  . DJD (degenerative joint disease) of hip     right  sided  . Chronic diastolic heart failure      Primarily diastolic CHF: Likely due to uncontrolled HTN. Last echo (8/12) with EF 45-50%, mild to moderate LVH with some asymmetric septal hypertrophy, RV normal size and systolic function. EF 50-55% by LV-gram in 6/12.   . Degeneration of lumbar or lumbosacral intervertebral disc   . Polyneuropathy in diabetes(357.2)   . Thoracic or lumbosacral neuritis or radiculitis, unspecified   . Calcifying tendinitis of shoulder   . LBBB (left bundle branch block)   . Chronic combined systolic and diastolic CHF (congestive heart failure)     EF 40-45% by echo 12/06/2012  . Coronary artery disease     questionable. LHC 05/2011 showing normal coronaries // [facility]ed at [facility], Dr. Shirlee Latch  . GERD (gastroesophageal reflux disease)     Current Outpatient Prescriptions  Medication Sig Dispense Refill  . albuterol (VENTOLIN HFA) 108 (90 BASE) MCG/ACT inhaler Inhale 2 puffs into the lungs every 6 (six) hours as needed for wheezing or shortness of breath. For wheezing  6.7 g  5  . aspirin EC 81 MG tablet Take 1 tablet (81 mg total) by mouth every morning.  30 tablet  5  . citalopram (CELEXA) 20 MG tablet Take 1 tablet (20 mg total) by mouth every morning.  30 tablet  5  .  cloNIDine (CATAPRES) 0.2 MG tablet Take 1 tablet (0.2 mg total) by mouth 2 (two) times daily.  60 tablet  4  . cyclobenzaprine (FLEXERIL) 10 MG tablet Take 1 tablet (10 mg total) by mouth 3 (three) times daily as needed for muscle spasms.  90 tablet  3  . diclofenac sodium (VOLTAREN) 1 % GEL Apply 2 g topically 4 (four) times daily as needed (for pain).      [name] digoxin (LANOXIN) 0.25 MG tablet Take 1 tablet (0.25 mg total) by mouth daily.  30 tablet  6  . ibuprofen (ADVIL,MOTRIN) 200 MG tablet Take 600 mg by mouth every 6 (six) hours as needed for pain.      [name] insulin aspart (NOVOLOG FLEXPEN) 100 UNIT/ML injection Inject 7 Units into the skin 3 (three) times daily before meals.  1 vial   1  . insulin glargine (LANTUS SOLOSTAR) 100 UNIT/ML injection Inject 0.2 mLs (20 Units total) into the skin at bedtime. DX: 250.02. Will need 3 pens/month  3 pen  1  . Insulin Pen Needle 31G X 5 MM MISC Use to inject insulin 4 times a day  120 each  0  . isosorbide mononitrate (IMDUR) 30 MG 24 hr tablet Take 1 tablet (30 mg total) by mouth daily.  30 tablet  6  . metoprolol succinate (TOPROL-XL) 100 MG 24 hr tablet Take 1 and 1/2 tablet by mouth daily. Take with or immediately following a meal.  45 tablet  5  . Misc. Devices (ROLLATOR) MISC Use daily for ambulation  1 each  0  . oxyCODONE-acetaminophen (PERCOCET) 10-325 MG per tablet Take 1 tablet by mouth every 8 (eight) hours as needed. For pain  60 tablet  0  . pantoprazole (PROTONIX) 40 MG tablet Take 1 tablet (40 mg total) by mouth daily.  30 tablet  5  . polyethylene glycol (MIRALAX / GLYCOLAX) packet Take 17 g by mouth daily as needed (constipation).  14 each  0  . simvastatin (ZOCOR) 40 MG tablet Take 1 tablet (40 mg total) by mouth daily at 6 PM.  30 tablet  11  . spironolactone (ALDACTONE) 25 MG tablet Take 1 tablet (25 mg total) by mouth daily.  30 tablet  5  . traMADol (ULTRAM) 50 MG tablet Take 1 tablet (50 mg total) by mouth every 6 (six) hours as needed for pain.  20 tablet  0  . furosemide (LASIX) 40 MG tablet Take 1 tablet (40 mg total) by mouth 2 (two) times daily.  60 tablet  5  . hydrALAZINE (APRESOLINE) 25 MG tablet Take 1 tablet (25 mg total) by mouth 3 (three) times daily.  90 tablet  6   No current facility-administered medications for this encounter.     PHYSICAL EXAM: Filed Vitals:   08/01/13 1102  BP: 144/98  Pulse: 113  Weight: 274 lb 12.8 oz (124.648 kg)  SpO2: 100%    General:  Fatigued appearing. No resp difficulty HEENT: normal Neck: supple. Thick. Hard to see JVP appears flat. Carotids 2+ bilaterally; no bruits. No lymphadenopathy appreciated. ? goiter Cor: PMI nonpalpable Regular rate & rhythm. No  rubs, gallops or murmurs. Split s2 Lungs: clear Abdomen: obese soft, nontender, nondistended. No hepatosplenomegaly. No bruits or masses. Good bowel sounds. Extremities: no cyanosis, clubbing, rash, edema Neuro: alert & orientedx3, cranial nerves grossly intact. Moves all 4 extremities w/o difficulty. Affect pleasant.   ASSESSMENT:  1. Chronic systolic HF    -07/26/13 EF  20%.   --NYHA  III-IIIB. Chronic LBBB plan for ICD by Dr Graciela Husbands.  Volume status stable. Continue lasix 40 mg twice a day Toprol XL increased this am by Dr Graciela Husbands 150 mg twice a day Contineu hydralazine 25 mg tid/IMDUR 30 mg daily She is not on an Ace-I due to angioedema.  Reinforced medication compliance, daily weights, and low salt food choices.  Next visit will check dig level.  [facility]   2. Uncontrolled HTN Continue current regimen. As noted above Toprol XL increased today  3. Morbid obesity Encouraged weight loss with portion control  4. DM2 Per PCP  5. OSA on CPAP Continue per Dr Shelle Iron  6.Chronic LBBB  Follow up in 3 weeks. Next visit will check dig level.

## 2013-08-01 NOTE — Patient Instructions (Addendum)
Follow up in 3 weeks  Do the following things EVERYDAY: 1) Weigh yourself in the morning before breakfast. Write it down and keep it in a log. 2) Take your medicines as prescribed 3) Eat low salt foods-Limit salt (sodium) to 2000 mg per day.  4) Stay as active as you can everyday 5) Limit all fluids for the day to less than 2 liters 

## 2013-08-01 NOTE — Progress Notes (Signed)
 ELECTROPHYSIOLOGY CONSULT NOTE  Patient ID: [name], MRN: [id number], DOB/AGE: 12/28/1965 46 y.o. Admit date: (Not on file) Date of Consult: 08/01/2013  Primary Physician: Jones, Eden, MD Primary Cardiologist: DM  Chief Complaint:  Consideration of CRT   HPI [name] is a 46 y.o. female  Referred for consideration of CRT D. Implantation.  She has left bundle branch block and catheterization 2012 demonstrated no obstructive disease. Ejection fraction by echo 12/13 demonstrated ejection fraction of 40-45%;  Echocardiogram 8/14 demonstrated EF of 20%. His deterioration has evolved despite ongoing guideline directed medical therapy. She has not been able to tolerate ACE/ARB secondary to angioedema.  She has significant congestive symptoms. She has orthopnea and nocturnal dyspnea. She has dyspnea on exertion at less than 100 feet and some resting dyspnea. Interestingly volume accumulation is manifested in her belly and in her feet but  Has normal RV function.  She has no history of syncope or palpitations.  She has treated obstructive sleep apnea.  She has significant back issues including lying flat.    Past Medical History  Diagnosis Date  . Hypertension     Poorly controlled. Has had HTN since age 16. Angioedema with ACEI.  24 Hr urine and renal arterial dopplers ordered . . . Never done  . Type II or unspecified type diabetes mellitus without mention of complication, uncontrolled DX: 2002  . NICM (nonischemic cardiomyopathy)     EF 45-50% in 8/12, cath 6/12 showed normal coronaries, EF 50-55% by LV gram  . Morbid obesity   . Allergic rhinitis   . HLD (hyperlipidemia)   . OSA on CPAP     sleep study in 8/12 showed moderate to severe OSA requiring CPAP  . Chronic lower back pain     secondary to DJD, obsetiy, hip problems. Followed by Dr. Zach Swartz (pain management)  . 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 Chronic diastolic heart  failure      Primarily diastolic CHF: Likely due to uncontrolled HTN. Last echo (8/12) with EF 45-50%, mild to moderate LVH with some asymmetric septal hypertrophy, RV normal size and systolic function. EF 50-55% by LV-gram in 6/12.   . Degeneration of lumbar or lumbosacral intervertebral disc   . Polyneuropathy in diabetes(357.2)   . Thoracic or lumbosacral neuritis or radiculitis, unspecified   . Calcifying tendinitis of shoulder   . LBBB (left bundle branch block)   . Chronic combined systolic and diastolic CHF (congestive heart failure)     EF 40-45% by echo 12/06/2012  . Coronary artery disease     questionable. LHC 05/2011 showing normal coronaries // Followed at [facility], Dr. McLean  . GERD (gastroesophageal reflux disease)       Surgical History:  Past Surgical History  Procedure Laterality Date  . Breast biopsies Bilateral 2011    patient reports benign results  . Tubal ligation  05/31/1985  . Hand surgery Left   . Shoulder surgery Right   . Dental surgery      tumors removed   . Cardiac catheterization  05/2011     Home Meds: Prior to Admission medications   Medication Sig Start Date End Date Taking? Authorizing Provider  albuterol (VENTOLIN HFA) 108 (90 BASE) MCG/ACT inhaler Inhale 2 puffs into the lungs every 6 (six) hours as needed for wheezing or shortness of breath. For wheezing 06/06/13  Yes Carolyn Ziemer, MD  aspirin EC 81 MG tablet Take 1 tablet (  81 mg total) by mouth every morning. 06/06/13  Yes Carolyn Ziemer, MD  citalopram (CELEXA) 20 MG tablet Take 1 tablet (20 mg total) by mouth every morning. 06/06/13  Yes Carolyn Ziemer, MD  cloNIDine (CATAPRES) 0.2 MG tablet Take 1 tablet (0.2 mg total) by mouth 2 (two) times daily. 06/06/13 06/06/14 Yes Carolyn Ziemer, MD  cyclobenzaprine (FLEXERIL) 10 MG tablet Take 1 tablet (10 mg total) by mouth 3 (three) times daily as needed for muscle spasms. 06/25/13  Yes Zachary T Swartz, MD  diclofenac sodium (VOLTAREN) 1 %  GEL Apply 2 g topically 4 (four) times daily as needed (for pain).   Yes Historical Provider, MD  digoxin (LANOXIN) 0.25 MG tablet Take 1 tablet (0.25 mg total) by mouth daily. 07/19/13  Yes Ali B Cosgrove, NP  furosemide (LASIX) 40 MG tablet Take 1 tablet (40 mg total) by mouth 2 (two) times daily. 06/06/13  Yes Carolyn Ziemer, MD  hydrALAZINE (APRESOLINE) 25 MG tablet Take 1 tablet (25 mg total) by mouth 3 (three) times daily. 07/19/13  Yes Ali B Cosgrove, NP  ibuprofen (ADVIL,MOTRIN) 200 MG tablet Take 600 mg by mouth every 6 (six) hours as needed for pain.   Yes Historical Provider, MD  insulin aspart (NOVOLOG FLEXPEN) 100 UNIT/ML injection Inject 7 Units into the skin 3 (three) times daily before meals. 04/09/13  Yes Maitri S Kalia-Reynolds, DO  insulin glargine (LANTUS SOLOSTAR) 100 UNIT/ML injection Inject 0.2 mLs (20 Units total) into the skin at bedtime. DX: 250.02. Will need 3 pens/month 04/09/13  Yes Maitri S Kalia-Reynolds, DO  Insulin Pen Needle 31G X 5 MM MISC Use to inject insulin 4 times a day 07/12/13  Yes Elizabeth A Butcher, MD  isosorbide mononitrate (IMDUR) 30 MG 24 hr tablet Take 1 tablet (30 mg total) by mouth daily. 07/19/13  Yes Ali B Cosgrove, NP  metoprolol succinate (TOPROL-XL) 100 MG 24 hr tablet Take 1 tablet (100 mg total) by mouth daily. Take with or immediately following a meal. 06/06/13  Yes Carolyn Ziemer, MD  Misc. Devices (ROLLATOR) MISC Use daily for ambulation 06/25/13  Yes Zachary T Swartz, MD  oxyCODONE-acetaminophen (PERCOCET) 10-325 MG per tablet Take 1 tablet by mouth every 8 (eight) hours as needed. For pain 07/23/13  Yes Zachary T Swartz, MD  pantoprazole (PROTONIX) 40 MG tablet Take 1 tablet (40 mg total) by mouth daily. 06/06/13  Yes Carolyn Ziemer, MD  polyethylene glycol (MIRALAX / GLYCOLAX) packet Take 17 g by mouth daily as needed (constipation). 06/06/13  Yes Carolyn Ziemer, MD  simvastatin (ZOCOR) 40 MG tablet Take 1 tablet (40 mg total) by mouth daily at 6 PM.  07/24/13  Yes Richard Kazibwe, MD  spironolactone (ALDACTONE) 25 MG tablet Take 1 tablet (25 mg total) by mouth daily. 06/06/13 06/06/14 Yes Carolyn Ziemer, MD  traMADol (ULTRAM) 50 MG tablet Take 1 tablet (50 mg total) by mouth every 6 (six) hours as needed for pain. 04/02/13  Yes Zachary T Swartz, MD   a Allergies:  Allergies  Allergen Reactions  . Clindamycin Anaphylaxis and Swelling    Swelling of pain  . Enalapril Maleate Swelling  . Lisinopril Swelling    History   Social History  . Marital Status: Single    Spouse Name: N/A    Number of Children: 2  . Years of Education: 9th grade   Occupational History  . Unemployed     planning on getting disability   Social History Main Topics  . Smoking status: Never   Smoker   . Smokeless tobacco: Never Used  . Alcohol Use: No  . Drug Use: No  . Sexual Activity: Yes    Birth Control/ Protection: Surgical   Other Topics Concern  . Not on file   Social History Narrative   Lives in [place] with her son. Is able to read and write fluently in English.     Family History  Problem Relation Age of Onset  . Heart disease Mother 42    Died of MI at age 42 yo  . Heart disease Paternal Grandmother     requiring pacemaker.  . Heart disease Paternal Grandfather 52    Died of MI at possibly age 52-53yo  . Stroke Paternal Grandfather   . Heart disease Father 64    MI age 64yo requiring stenting  . Kidney disease Mother     requiring dialysis  . Congestive Heart Failure Mother   . Diabetes Father   . Diabetes Brother   . Heart disease Brother 23    MI at age 23 years old  . Breast cancer Paternal Aunt   . Breast cancer Maternal Grandmother      ROS:  Please see the history of present illness.   Negative except stress  All other systems reviewed and negative.    Physical Exam:   Blood pressure 146/93, pulse 110, height 5' 9" (1.753 m), weight 273 lb (123.832 kg), last menstrual period 07/23/2013. General: Well developed,  well nourished obese age appearing African American female in no acute distress. Head: Normocephalic, atraumatic, sclera non-icteric, no xanthomas, nares are without discharge. EENT: normal Lymph Nodes:  none Back: without scoliosis/kyphosis , no CVA tendersness Neck: Negative for carotid bruits. JVD 6-7 cm; there is a large goiter Lungs: Clear bilaterally to auscultation without wheezes, rales, or rhonchi. Breathing is unlabored. Heart: RRR with S1 S2. no murmur , rubs, or gallops appreciated. Abdomen: Soft, non-tender, non-distended with normoactive bowel sounds. No hepatomegaly. No rebound/guarding. No obvious abdominal masses. Msk:  Strength and tone appear normal for age. Extremities: No clubbing or cyanosis. no edema.  Distal pedal pulses are 2+ and equal bilaterally. Skin: Warm and Dry Neuro: Alert and oriented X 3. CN III-XII intact Grossly normal sensory and motor function . Psych:  Responds to questions appropriately with a normal affect.      Labs: Cardiac Enzymes No results found for this basename: CKTOTAL, CKMB, TROPONINI,  in the last 72 hours CBC Lab Results  Component Value Date   WBC 11.8* 04/27/2013   HGB 12.4 04/27/2013   HCT 37.0 04/27/2013   MCV 82.0 04/27/2013   PLT 243 04/27/2013   PROTIME: No results found for this basename: LABPROT, INR,  in the last 72 hours Chemistry No results found for this basename: NA, K, CL, CO2, BUN, CREATININE, CALCIUM, LABALBU, PROT, BILITOT, ALKPHOS, ALT, AST, GLUCOSE,  in the last 168 hours Lipids Lab Results  Component Value Date   CHOL 194 12/07/2012   HDL 47 12/07/2012   LDLCALC 102* 12/07/2012   TRIG 224* 12/07/2012   BNP Pro B Natriuretic peptide (BNP)  Date/Time Value Range Status  07/09/2013  4:12 PM 230.0* 0.0 - 100.0 pg/mL Final  04/27/2013 12:50 AM 608.2* 0 - 125 pg/mL Final  04/09/2013  5:56 AM 516.7* <126 pg/mL Final  02/24/2013  7:05 PM 424.3* 0 - 125 pg/mL Final   Miscellaneous Lab Results  Component Value  Date   DDIMER 0.38 06/10/2011    Radiology/Studies:  Mm Digital Screening    07/31/2013   *RADIOLOGY REPORT*  Clinical Data: Screening.  DIGITAL SCREENING BILATERAL MAMMOGRAM WITH CAD  Comparison:  Previous exam(s).  FINDINGS:  ACR Breast Density Category b:  There are scattered areas of fibroglandular density.  There are no findings suspicious for malignancy.  Images were processed with CAD.  IMPRESSION: No mammographic evidence of malignancy.  A result letter of this screening mammogram will be mailed directly to the patient.  RECOMMENDATION: Screening mammogram in one year. (Code:SM-B-01Y)  BI-RADS CATEGORY 1:  Negative.   Original Report Authenticated By: Thomas Lawrence, M.D.    EKG: sinus rhythm at 110 beats per minutes Intervals 14/15/40 Axis leftward at -4 Left bundle branch block   Assessment and Plan: *   Adren Dollins   

## 2013-08-01 NOTE — Patient Instructions (Addendum)
Your physician has recommended you make the following change in your medication:   1. Increase Metoprolol to 150 mg daily. (1 and 1/2 tablet daily).   Herbert Seta, RN will call you regarding scheduling an appointment for Device Implantation.

## 2013-08-01 NOTE — Assessment & Plan Note (Signed)
She is euvolemic. She is tachycardic. We'll increase her Toprol today from 100--150.  Her TSH and CBC have been normal in the last number of months

## 2013-08-01 NOTE — Assessment & Plan Note (Signed)
Needs eval per PCP

## 2013-08-02 ENCOUNTER — Other Ambulatory Visit (HOSPITAL_COMMUNITY): Payer: Self-pay | Admitting: Adult Health

## 2013-08-02 DIAGNOSIS — I5022 Chronic systolic (congestive) heart failure: Secondary | ICD-10-CM

## 2013-08-03 ENCOUNTER — Other Ambulatory Visit: Payer: Self-pay | Admitting: *Deleted

## 2013-08-03 ENCOUNTER — Other Ambulatory Visit: Payer: Self-pay

## 2013-08-03 DIAGNOSIS — I10 Essential (primary) hypertension: Secondary | ICD-10-CM

## 2013-08-03 DIAGNOSIS — F32A Depression, unspecified: Secondary | ICD-10-CM

## 2013-08-03 DIAGNOSIS — E1140 Type 2 diabetes mellitus with diabetic neuropathy, unspecified: Secondary | ICD-10-CM

## 2013-08-03 DIAGNOSIS — I5042 Chronic combined systolic (congestive) and diastolic (congestive) heart failure: Secondary | ICD-10-CM

## 2013-08-03 DIAGNOSIS — I5032 Chronic diastolic (congestive) heart failure: Secondary | ICD-10-CM

## 2013-08-03 DIAGNOSIS — F329 Major depressive disorder, single episode, unspecified: Secondary | ICD-10-CM

## 2013-08-03 DIAGNOSIS — N390 Urinary tract infection, site not specified: Secondary | ICD-10-CM

## 2013-08-03 DIAGNOSIS — R9431 Abnormal electrocardiogram [ECG] [EKG]: Secondary | ICD-10-CM

## 2013-08-03 DIAGNOSIS — K219 Gastro-esophageal reflux disease without esophagitis: Secondary | ICD-10-CM

## 2013-08-03 MED ORDER — DICLOFENAC SODIUM 1 % TD GEL: 2.0000 g | Freq: Four times a day (QID) | TRANSDERMAL | Status: DC | PRN

## 2013-08-03 MED ORDER — POLYETHYLENE GLYCOL 3350 17 G PO PACK: 17.0000 g | PACK | Freq: Every day | ORAL | Status: DC | PRN

## 2013-08-03 NOTE — Telephone Encounter (Signed)
Message sent to front office to sched pt an appt.

## 2013-08-03 NOTE — Telephone Encounter (Signed)
Refill on Voltaren gel sent to [facility].

## 2013-08-03 NOTE — Telephone Encounter (Signed)
Pls sch CC appt with Dr Yetta Barre in Sep - it appears he has CC openings.   Constipation and therefore need for chronic miralax is not on problem list. But pt is on opioids so therefore will fill. But other etiologies (cancer - family hx?) needs investigated.

## 2013-08-06 ENCOUNTER — Encounter: Payer: Self-pay | Admitting: *Deleted

## 2013-08-06 ENCOUNTER — Telehealth: Payer: Self-pay | Admitting: Pulmonary Disease

## 2013-08-06 ENCOUNTER — Telehealth: Payer: Self-pay | Admitting: Internal Medicine

## 2013-08-06 DIAGNOSIS — I428 Other cardiomyopathies: Secondary | ICD-10-CM

## 2013-08-06 DIAGNOSIS — I509 Heart failure, unspecified: Secondary | ICD-10-CM

## 2013-08-06 NOTE — Telephone Encounter (Signed)
New problem   Pt stated she is waiting to be sched for device and pacemaker and haven't heard from anyone. Please call pt concerning this matter.

## 2013-08-06 NOTE — Telephone Encounter (Signed)
Sleep apnea is not a medical issue that provides for disability.  It is 100% treatable with cpap and weight loss.  Her being on cpap will have no bearing on her social security disability. They will have records sent to them anyway from her various md's.

## 2013-08-06 NOTE — Telephone Encounter (Signed)
lmomtcb x1 for [name] to see what is going on  I spoke with pt and aware we are working on this. She stated she needs a letter stating she is on CPAP. She is trying to Social security and for her PCP as well. Please advise KC regarding letter thanks

## 2013-08-06 NOTE — Telephone Encounter (Signed)
lmomtcb x1 for pt and will also await melissa call back

## 2013-08-06 NOTE — Telephone Encounter (Signed)
Pt aware of CRT-D procedure scheduled for 08/22/13 at 1030am. See instruction sheet

## 2013-08-06 NOTE — Telephone Encounter (Signed)
Routed to [name] Price, RN. 

## 2013-08-07 ENCOUNTER — Telehealth: Payer: Self-pay | Admitting: *Deleted

## 2013-08-07 NOTE — Telephone Encounter (Signed)
Melissa returned call Advised Melissa that pt has yet to hear anything from 8.15.14 ov w/ KC:  Note  Staff message sent to [facility] for [facility] to:  1) check machine and make sure working properly  2) put on auto 5-15 cm  3) show pt some of newer masks, and work on mask fit.  Pt aware of referral. Charlesetta Shanks stated that she is driving and will call once she checks into this.  Will await her call back.

## 2013-08-07 NOTE — Telephone Encounter (Signed)
This is an impossible question for a clinician who does not know the patient.  Who asked that this test be performed?  This patient apparantly has depression and is on treatment.  If the patient is uncomfortable or believes the treatment is not working, she should be referred to her doctor.  If the nurse is concerned, have her make an appointment for the patient to be seen.

## 2013-08-07 NOTE — Telephone Encounter (Signed)
Call from [facility] Manager with [facility] - # [phone] She did a home visit today and pt had a PHQ-9 depression screen score of 12.  They have to report if over 5.  Please advise

## 2013-08-08 ENCOUNTER — Telehealth (HOSPITAL_COMMUNITY): Payer: Self-pay | Admitting: Cardiology

## 2013-08-08 ENCOUNTER — Other Ambulatory Visit (INDEPENDENT_AMBULATORY_CARE_PROVIDER_SITE_OTHER): Payer: Medicaid Other

## 2013-08-08 DIAGNOSIS — I428 Other cardiomyopathies: Secondary | ICD-10-CM

## 2013-08-08 DIAGNOSIS — I509 Heart failure, unspecified: Secondary | ICD-10-CM

## 2013-08-08 LAB — CBC WITH DIFFERENTIAL/PLATELET
Basophils Relative: 0.5 % (ref 0.0–3.0)
Eosinophils Relative: 1.4 % (ref 0.0–5.0)
HCT: 35.7 % — ABNORMAL LOW (ref 36.0–46.0)
Hemoglobin: 11.9 g/dL — ABNORMAL LOW (ref 12.0–15.0)
Lymphocytes Relative: 38.7 % (ref 12.0–46.0)
Lymphs Abs: 2.8 10*3/uL (ref 0.7–4.0)
Monocytes Relative: 9 % (ref 3.0–12.0)
Neutro Abs: 3.6 10*3/uL (ref 1.4–7.7)
RBC: 4.21 Mil/uL (ref 3.87–5.11)
RDW: 13.9 % (ref 11.5–14.6)

## 2013-08-08 LAB — BASIC METABOLIC PANEL
Calcium: 8.9 mg/dL (ref 8.4–10.5)
GFR: 94.79 mL/min (ref >60.00)
Glucose, Bld: 182 mg/dL — ABNORMAL HIGH (ref 70–99)
Potassium: 3.7 mEq/L (ref 3.5–5.1)
Sodium: 135 mEq/L (ref 135–145)

## 2013-08-08 NOTE — Telephone Encounter (Signed)
I spoke with [facility]. She stated she did not see this was in her in basket. She is putting rush on this and pt should be contacted soon. lmomtcb x1 for pt

## 2013-08-08 NOTE — Telephone Encounter (Signed)
Pt called to request a letter of support for her disability case. Original case was denied and pt is now working with Joyce Gross Timbo-general attorney

## 2013-08-09 NOTE — Telephone Encounter (Signed)
I called and made pt aware. Nothing further needed 

## 2013-08-09 NOTE — Telephone Encounter (Signed)
Pt aware [facility] should contact her soon.   Pt would like to know status of letter that she needs [place] to write her. Please advise.

## 2013-08-09 NOTE — Telephone Encounter (Signed)
We do not write letters for disability.  Either the social security administration gets her records from [place], or they send [place] a form to fill out.  We do not determine if someone is disabled, but the MD with social security.

## 2013-08-13 HISTORY — PX: BI-VENTRICULAR IMPLANTABLE CARDIOVERTER DEFIBRILLATOR  (CRT-D): SHX5747

## 2013-08-14 ENCOUNTER — Encounter (HOSPITAL_COMMUNITY): Payer: Self-pay | Admitting: Pharmacy Technician

## 2013-08-20 ENCOUNTER — Ambulatory Visit (INDEPENDENT_AMBULATORY_CARE_PROVIDER_SITE_OTHER): Payer: Medicaid Other | Admitting: Dietician

## 2013-08-20 ENCOUNTER — Ambulatory Visit (INDEPENDENT_AMBULATORY_CARE_PROVIDER_SITE_OTHER): Payer: Medicaid Other | Admitting: Internal Medicine

## 2013-08-20 ENCOUNTER — Encounter: Payer: Self-pay | Admitting: Internal Medicine

## 2013-08-20 ENCOUNTER — Telehealth: Payer: Self-pay | Admitting: *Deleted

## 2013-08-20 VITALS — BP 144/98 | HR 103 | Temp 98.2°F | Wt 281.0 lb

## 2013-08-20 DIAGNOSIS — E049 Nontoxic goiter, unspecified: Secondary | ICD-10-CM

## 2013-08-20 DIAGNOSIS — E1140 Type 2 diabetes mellitus with diabetic neuropathy, unspecified: Secondary | ICD-10-CM

## 2013-08-20 DIAGNOSIS — E1142 Type 2 diabetes mellitus with diabetic polyneuropathy: Secondary | ICD-10-CM

## 2013-08-20 DIAGNOSIS — T148 Other injury of unspecified body region: Secondary | ICD-10-CM

## 2013-08-20 DIAGNOSIS — E1149 Type 2 diabetes mellitus with other diabetic neurological complication: Secondary | ICD-10-CM

## 2013-08-20 DIAGNOSIS — I10 Essential (primary) hypertension: Secondary | ICD-10-CM

## 2013-08-20 DIAGNOSIS — W57XXXA Bitten or stung by nonvenomous insect and other nonvenomous arthropods, initial encounter: Secondary | ICD-10-CM | POA: Insufficient documentation

## 2013-08-20 LAB — TSH: TSH: 2.356 u[IU]/mL (ref 0.350–4.500)

## 2013-08-20 MED ORDER — ACCU-CHEK NANO SMARTVIEW W/DEVICE KIT: 1.0000 | PACK | Freq: Three times a day (TID) | Status: DC

## 2013-08-20 NOTE — Assessment & Plan Note (Signed)
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 Some nodularity noted on right lobe and just right of midline. TSH and fT4 2.36 and 1.21 today, respectively. Given normal values, will schedule Thyroid ultrasound in the near future.

## 2013-08-20 NOTE — Patient Instructions (Signed)
Please check blood sugar as instructed by your doctor and make a follow up with [name] in 1-2 weeks or on same day as next doctor appointment.

## 2013-08-20 NOTE — Assessment & Plan Note (Signed)
Patient w/ known history of uncontrolled DM. Most recent A1c in 07/2013 was 9.2. Patient still waiting on glucometer and Lantus via [facility]. Currently taking 7 units of Novolog with meals. Patient also saw Norm Parcel, DM educator today. Patient will return in 4-6 weeks with glucometer readings to discuss further management of DM.

## 2013-08-20 NOTE — Patient Instructions (Addendum)
General Instructions: 1. Please make an appointment to see me in 4-6 weeks to discuss Diabetes management and blood pressure management. 2. Please take all medications as prescribed. Use Benadryl over the counter at night to relieve itching symptoms and use hydrocortisone 1% cream, apply to bites for symptomatic relief of itching.  3. If you have worsening of your symptoms or new symptoms arise, please call the clinic ([phone]), or go to the ER immediately if symptoms are severe.  You have done great job in taking all your medications. I appreciate it very much. Please continue doing that.   Treatment Goals:  Goals (1 Years of Data) as of 08/20/13   None      Progress Toward Treatment Goals:  Treatment Goal 08/20/2013  Hemoglobin A1C improved  Blood pressure unchanged    Self Care Goals & Plans:  Self Care Goal 08/20/2013  Manage my medications take my medicines as prescribed; refill my medications on time; bring my medications to every visit  Monitor my health keep track of my blood glucose  Eat healthy foods eat foods that are low in salt; eat baked foods instead of fried foods    Home Blood Glucose Monitoring 07/24/2013  Check my blood sugar 3 times a day  When to check my blood sugar before breakfast; before lunch; before dinner     Care Management & Community Referrals:  Referral 08/20/2013  Referrals made for care management support none needed

## 2013-08-20 NOTE — Assessment & Plan Note (Signed)
Blood pressure slightly elevated, but relatively consistent with her previous clinic visits. Currently on Clonidine 0.2 mg bid, Lasix 40 mg bid, Hydralazine 25 mg tid, Toprol XL 150 mg qd, and Spironolactone 25 mg qd. Considering the multitude of BP meds at this time, will continue with current regimen and consider adjusting at next visit.

## 2013-08-20 NOTE — Progress Notes (Signed)
Diabetes Self-Management Education  Visit Number: First/Initial  08/20/2013 [name], identified by name and date of birth, is a 47 y.o. female with Diabetes Type: Type 2.      ASSESSMENT  Patient Concerns:  Monitoring;Glycemic Control;Weight Control  Last menstrual period 07/23/2013. I.  Lab Results: LDL Cholesterol  Date Value Range Status  12/07/2012 102* 0 - 99 mg/dL Final                Hemoglobin A1C  Date Value Range Status  07/24/2013 9.2   Final  12/07/2012 11.4* <5.7 % Final     (NOTE)                                                                               Family History  Problem Relation Age of Onset  . Heart disease Mother 33    Died of MI at age 28 yo  . Heart disease Paternal Grandmother     requiring pacemaker.  [name] Heart disease Paternal Grandfather 70    Died of MI at possibly age 58-53yo  . Stroke Paternal Grandfather   . Heart disease Father 4    MI age 43yo requiring stenting  . Kidney disease Mother     requiring dialysis  . Congestive Heart Failure Mother   . Diabetes Father   . Diabetes Brother   . Heart disease Brother 56    MI at age 64 years old  . Breast cancer Paternal Aunt   . Breast cancer Maternal Grandmother    History  Substance Use Topics  . Smoking status: Never Smoker   . Smokeless tobacco: Never Used  . Alcohol Use: No    Support Systems:  Other (comment) Special Needs:  None  Prior DM Education:   no Daily Foot Exams:  no Patient Belief / Attitude about Diabetes:  Diabetes can be controlled;Other (comment)  Assessment comments:  patient processed fear about strong family history of  diabetes complications.    Individualized Plan for Diabetes Self-Management Training: Patient individualized diabetes plan discussed today with patient and includes: what is Diabetes, Nutrition, medications, monitoring, physical activity, how to handle highs and lows, Special care for my body when I have diabetes, Dealing  daily with diabetes  Education Topics Reviewed with Patient Today:  Topic Points Discussed  Disease State    Nutrition Management    Physical Activity and Exercise    Medications Reviewed patients medication for diabetes, action, purpose, timing of dose and side effects.  Monitor Taught/evaluated SMBG with accu chek meter.;Purpose and frequency of SMBG.  Acute Complications    Chronic Complications    Psychosocial Adjustment Worked with patient to identify barriers to care and solutions;Role of stress on diabetes;Identified and addressed patients feelings and concerns about diabetes  Goal Setting    Preconception Care (if applicable)      PATIENTS GOALS   Learning Objective(s):     Goal The patient agrees to:  Nutrition    Physical Activity    Medications  Take medications as directed  Monitoring test my blood glucose as discussed (note x per day with comment);test blood glucose pre and post meals as discussed  Problem Solving  Reducing Risk    Health Coping     Patient Self-Evaluation of Goals (Subsequent Visits)  Goal The patient rates self as meeting goals (% of time)  Nutrition    Physical Activity    Medications    Monitoring    Problem Solving    Reducing Risk    Health Coping       PERSONALIZED PLAN / SUPPORT  Self-Management Support:  Doctor's office;Family;CDE visits Social work's name and number provided as patient verbalized eviction from home and lack of income as large barriers to care.  ______________________________________________________________________  Outcomes  Expected Outcomes:  Demonstrated interest in learning. Expect positive outcomes Self-Care Barriers:  Lack of material resources Education material provided: yes If problems or questions, patient to contact team via:  Phone Time in: 1130     Time out: 1200 Future DSME appointment: - 2 wks;4-6 wks   Plyler, [name] 08/20/2013 4:32 PM

## 2013-08-20 NOTE — Progress Notes (Signed)
Subjective:   Patient ID: [name] female   DOB: October 23, 1966 47 y.o.   MRN: [id number]  HPI: [name] is a 47 y.o. F/ w PMHx of HTN, DM II, chronic dCHF/NICM (EF 45-50% as of 07/2011), CAD, HLD, OSA (on CPAP), and GERD, presents to the clinic today w/ complaints of insect bites that are pruritic in nature, present on her arms, legs, neck, and back. She says she recently moved into a new home on 07/31/13 and has been having problems with insect bites since that time. She says the bites are very itchy and started on her neck, but now she has several more on her arms and legs as well. She recently had an exterminator come and spray her house, and since that time, the bites are more infrequent, however she is still getting them. The exterminator is supposed to return today for another spray.The bites are small, papule-appearing lesions, very pruritic in nature. No surrounding erythema or drainage noted. No excoriations noted. The patient does describe seeing a flee at one point in her house that jumped off of her leg, which would be consistent with the appareance of the lesions.  The patient also follows with the CHF clinic and was recently seen by Dr. Graciela Husbands. She is scheduled to receive an AICD/PPM placement this month, according to the patient. She describes symptoms of PND and 4-5 pillow orthopnea as well as DOE. Also of note, Dr. Graciela Husbands mentioned findings of a nodular goiter on exam. No symptoms of hypo- or hyperthyroidism. Patient had a normal TSH of 3.548 on 02/25/13. No other symptoms; denies chest pain, abdominal pain, nausea, vomiting, fever, chills, or cough.   Patient also has history of poorly controlled DM. Last HbA1c on 07/24/13 was 9.2  Past Medical History  Diagnosis Date  . Hypertension     Poorly controlled. Has had HTN since age 14. Angioedema with ACEI.  24 Hr urine and renal arterial dopplers ordered . . . Never done  . Type II or unspecified type diabetes mellitus  without mention of complication, uncontrolled DX: 2002  . NICM (nonischemic cardiomyopathy)     EF 45-50% in 8/12, cath 6/12 showed normal coronaries, EF 50-55% by LV gram  . Morbid obesity   . Allergic rhinitis   . HLD (hyperlipidemia)   . OSA on CPAP     sleep study in 8/12 showed moderate to severe OSA requiring CPAP  . Chronic lower back pain     secondary to DJD, obsetiy, hip problems. Followed by Dr. Ivory Broad (pain management)  . 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 Chronic diastolic heart failure      Primarily diastolic CHF: Likely due to uncontrolled HTN. Last echo (8/12) with EF 45-50%, mild to moderate LVH with some asymmetric septal hypertrophy, RV normal size and systolic function. EF 50-55% by LV-gram in 6/12.   . Degeneration of lumbar or lumbosacral intervertebral disc   . Polyneuropathy in diabetes(357.2)   . Thoracic or lumbosacral neuritis or radiculitis, unspecified   . Calcifying tendinitis of shoulder   . LBBB (left bundle branch block)   . Chronic combined systolic and diastolic CHF (congestive heart failure)     EF 40-45% by echo 12/06/2012  . Coronary artery disease     questionable. LHC 05/2011 showing normal coronaries // Followed at [facility], Dr. Shirlee Latch  . GERD (gastroesophageal reflux disease)    Current Outpatient Prescriptions  Medication Sig  Dispense Refill  . albuterol (PROVENTIL HFA;VENTOLIN HFA) 108 (90 BASE) MCG/ACT inhaler Inhale 2 puffs into the lungs every 6 (six) hours as needed for wheezing or shortness of breath. For wheezing      . aspirin EC 81 MG tablet Take 81 mg by mouth every morning.      . citalopram (CELEXA) 20 MG tablet Take 20 mg by mouth every morning.      . cloNIDine (CATAPRES) 0.2 MG tablet Take 0.2 mg by mouth 2 (two) times daily.      . cyclobenzaprine (FLEXERIL) 10 MG tablet Take 10 mg by mouth 3 (three) times daily as needed for muscle spasms.      . diclofenac sodium (VOLTAREN) 1 % GEL  Apply 2 g topically 4 (four) times daily as needed (for pain).      [name] digoxin (LANOXIN) 0.25 MG tablet Take 0.25 mg by mouth daily.      . furosemide (LASIX) 40 MG tablet Take 40 mg by mouth 2 (two) times daily.      . hydrALAZINE (APRESOLINE) 25 MG tablet Take 25 mg by mouth 3 (three) times daily.      [name] ibuprofen (ADVIL,MOTRIN) 200 MG tablet Take 600 mg by mouth every 6 (six) hours as needed for pain.      [name] insulin aspart (NOVOLOG FLEXPEN) 100 UNIT/ML injection Inject 7 Units into the skin 3 (three) times daily before meals.  1 vial  1  . insulin glargine (LANTUS SOLOSTAR) 100 UNIT/ML injection Inject 0.2 mLs (20 Units total) into the skin at bedtime. DX: 250.02. Will need 3 pens/month  3 pen  1  . Insulin Pen Needle 31G X 5 MM MISC Use to inject insulin 4 times a day  120 each  0  . isosorbide mononitrate (IMDUR) 30 MG 24 hr tablet Take 30 mg by mouth daily.      . metoprolol succinate (TOPROL-XL) 100 MG 24 hr tablet Take 150 mg by mouth daily. Take 1 and 1/2 tablet by mouth daily. Take with or immediately following a meal.      . Misc. Devices (ROLLATOR) MISC Use daily for ambulation  1 each  0  . oxyCODONE-acetaminophen (PERCOCET) 10-325 MG per tablet Take 1 tablet by mouth every 8 (eight) hours as needed for pain. For pain      . pantoprazole (PROTONIX) 40 MG tablet Take 40 mg by mouth daily.      . simvastatin (ZOCOR) 40 MG tablet Take 40 mg by mouth daily at 6 PM.      . spironolactone (ALDACTONE) 25 MG tablet Take 25 mg by mouth daily.      . traMADol (ULTRAM) 50 MG tablet Take 1 tablet (50 mg total) by mouth every 6 (six) hours as needed for pain.  20 tablet  0   No current facility-administered medications for this visit.   Family History  Problem Relation Age of Onset  . Heart disease Mother 47    Died of MI at age 63 yo  . Heart disease Paternal Grandmother     requiring pacemaker.  [name] Heart disease Paternal Grandfather 59    Died of MI at possibly age 90-53yo  . Stroke  Paternal Grandfather   . Heart disease Father 71    MI age 50yo requiring stenting  . Kidney disease Mother     requiring dialysis  . Congestive Heart Failure Mother   . Diabetes Father   . Diabetes Brother   . Heart disease  Brother 36    MI at age 63 years old  . Breast cancer Paternal Aunt   . Breast cancer Maternal Grandmother    History   Social History  . Marital Status: Single    Spouse Name: N/A    Number of Children: 2  . Years of Education: 9th grade   Occupational History  . Unemployed     planning on getting disability   Social History Main Topics  . Smoking status: Never Smoker   . Smokeless tobacco: Never Used  . Alcohol Use: No  . Drug Use: No  . Sexual Activity: Yes    Birth Control/ Protection: Surgical   Other Topics Concern  . None   Social History Narrative   Lives in [place] with her son. Is able to read and write fluently in [place].   Review of Systems: General: Denies fever, chills, diaphoresis, appetite change and fatigue.  HEENT: Denies change in vision, eye pain, redness, hearing loss, congestion, sore throat, rhinorrhea, sneezing, mouth sores, trouble swallowing, neck pain, neck stiffness and tinnitus.   Respiratory: Positive for DOE and intermittent SOB. Denies cough, chest tightness, and wheezing.   Cardiovascular: Denies chest pain, palpitations and leg swelling.  Gastrointestinal: Denies nausea, vomiting, abdominal pain, diarrhea, constipation, blood in stool and abdominal distention.  Genitourinary: Denies dysuria, urgency, frequency, hematuria, flank pain and difficulty urinating.  Endocrine: Denies hot or cold intolerance, sweats, polyuria, polydipsia. Musculoskeletal: Denies myalgias, back pain, joint swelling, arthralgias and gait problem.  Skin: Positive for insect bites, on arms, legs, and neck. Denies pallor, rash and wounds.  Neurological: Denies dizziness, seizures, syncope, weakness, lightheadedness, numbness and headaches.   Hematological: Denies adenopathy,easy bruising, personal or family bleeding history.  Psychiatric/Behavioral: Denies mood changes, confusion, nervousness, sleep disturbance and agitation.  Objective:  Physical Exam: Filed Vitals:   08/20/13 1007  BP: 144/98  Pulse: 103  Temp: 98.2 F (36.8 C)  TempSrc: Oral  Weight: 281 lb (127.461 kg)  SpO2: 99%   General: Vital signs reviewed.  Patient is a well-developed and well-nourished, in no acute distress and cooperative with exam. Alert and oriented x3.  Head: Normocephalic and atraumatic. Nose: No erythema or drainage noted.  Turbinates normal. Mouth: No erythema, exudates, sores, or ulcerations. Moist mucus membranes. Eyes: PERRL, EOMI, conjunctivae normal, No scleral icterus.  Neck: Thyroid appears enlarged on exam, nodularity to palpation right of midline and in right lobe. No tenderness. Supple, trachea midline, normal ROM, No JVD, masses, or carotid bruit present.  Cardiovascular: RRR, S1 normal, S2 normal, no murmurs, gallops, or rubs. Pulmonary/Chest: normal respiratory effort, CTAB, no wheezes, rales, or rhonchi. Abdominal: Soft. Non-tender, non-distended, bowel sounds are normal, no masses, organomegaly, or guarding present.  Musculoskeletal: No joint deformities, erythema, or stiffness, ROM full and no nontender. Extremities: No swelling or edema,  pulses symmetric and intact bilaterally. No cyanosis or clubbing. Neurological: A&O x3, Strength is normal and symmetric bilaterally, cranial nerve II-XII are grossly intact, no focal motor deficit, sensory intact to light touch bilaterally.  Skin: Warm, dry and intact. No rashes or erythema. Small papular appearing lesions on back of neck, arm creases, and left leg. No surrounding erythema, excoriations, or purulent discharge.  Psychiatric: Normal mood and affect. speech and behavior is normal. Cognition and memory are normal.   Assessment & Plan:   Please see problem-based  assessment and plan.

## 2013-08-20 NOTE — Telephone Encounter (Signed)
Informed patient that we were able to schedule anesthesia for Wednesday 9/10. Explained that her time moved to 11:00am and to be a [employer] at [employer]. Pt agreeable to plan

## 2013-08-20 NOTE — Assessment & Plan Note (Signed)
Patient presents today with multiple insect bites on her neck/back, arms, and legs. Just moved into a new home and has been having this problem ever since. Had exterminator come and spray for flees, bedbugs, spiders, etc. Patient did note a flee in her house recently. Bites look like small papular lesions, most likely flee bites. Do not appear to be scabies or bedbugs at this time. No presence of erythema or excoriations, no lesions in groin area or in between digits. Discussed symptomatic treatment of pruritis with the patient. Instructed her to use hydrocortisone on lesions and Benadryl at night for symptomatic relief.

## 2013-08-21 MED ORDER — DEXTROSE 5 % IV SOLN
3.0000 g | INTRAVENOUS | Status: AC
  Administered 2013-08-22: 3 g via INTRAVENOUS
  Filled 2013-08-21: qty 3000

## 2013-08-21 MED ORDER — SODIUM CHLORIDE 0.9 % IR SOLN
80.0000 mg | Status: DC
  Filled 2013-08-21: qty 2

## 2013-08-21 NOTE — Progress Notes (Signed)
I saw and evaluated the patient.  I personally confirmed the key portions of the history and exam documented by Dr. Yetta Barre and I reviewed pertinent patient test results.  The assessment, diagnosis, and plan were formulated together and I agree with the documentation in the resident's note.  If blood pressure is elevated on return, would adjust regimen.

## 2013-08-22 ENCOUNTER — Ambulatory Visit (HOSPITAL_COMMUNITY): Payer: Medicaid Other | Admitting: Certified Registered"

## 2013-08-22 ENCOUNTER — Encounter (HOSPITAL_COMMUNITY)
Admission: RE | Disposition: A | Discharge status: Home / Self Care | Payer: Medicaid Other | Source: Ambulatory Visit | Attending: Internal Medicine

## 2013-08-22 ENCOUNTER — Encounter (HOSPITAL_COMMUNITY): Payer: Self-pay | Admitting: Anesthesiology

## 2013-08-22 ENCOUNTER — Ambulatory Visit (HOSPITAL_COMMUNITY)
Admission: RE | Admit: 2013-08-22 | Discharge: 2013-08-24 | Disposition: A | Discharge status: Home / Self Care | Payer: Medicaid Other | Source: Ambulatory Visit | Attending: Internal Medicine | Admitting: Internal Medicine

## 2013-08-22 ENCOUNTER — Encounter (HOSPITAL_COMMUNITY): Payer: Self-pay | Admitting: Certified Registered"

## 2013-08-22 ENCOUNTER — Ambulatory Visit (HOSPITAL_COMMUNITY): Admission: RE | Admit: 2013-08-22 | Payer: Medicaid Other | Source: Ambulatory Visit | Admitting: Internal Medicine

## 2013-08-22 DIAGNOSIS — W57XXXA Bitten or stung by nonvenomous insect and other nonvenomous arthropods, initial encounter: Secondary | ICD-10-CM

## 2013-08-22 DIAGNOSIS — I9589 Other hypotension: Secondary | ICD-10-CM | POA: Insufficient documentation

## 2013-08-22 DIAGNOSIS — I5042 Chronic combined systolic (congestive) and diastolic (congestive) heart failure: Secondary | ICD-10-CM

## 2013-08-22 DIAGNOSIS — Z6841 Body Mass Index (BMI) 40.0 and over, adult: Secondary | ICD-10-CM | POA: Insufficient documentation

## 2013-08-22 DIAGNOSIS — K219 Gastro-esophageal reflux disease without esophagitis: Secondary | ICD-10-CM | POA: Insufficient documentation

## 2013-08-22 DIAGNOSIS — E1142 Type 2 diabetes mellitus with diabetic polyneuropathy: Secondary | ICD-10-CM

## 2013-08-22 DIAGNOSIS — R9431 Abnormal electrocardiogram [ECG] [EKG]: Secondary | ICD-10-CM

## 2013-08-22 DIAGNOSIS — E1140 Type 2 diabetes mellitus with diabetic neuropathy, unspecified: Secondary | ICD-10-CM

## 2013-08-22 DIAGNOSIS — E785 Hyperlipidemia, unspecified: Secondary | ICD-10-CM | POA: Insufficient documentation

## 2013-08-22 DIAGNOSIS — R300 Dysuria: Secondary | ICD-10-CM

## 2013-08-22 DIAGNOSIS — M549 Dorsalgia, unspecified: Secondary | ICD-10-CM | POA: Insufficient documentation

## 2013-08-22 DIAGNOSIS — R0602 Shortness of breath: Secondary | ICD-10-CM

## 2013-08-22 DIAGNOSIS — I10 Essential (primary) hypertension: Secondary | ICD-10-CM

## 2013-08-22 DIAGNOSIS — E669 Obesity, unspecified: Secondary | ICD-10-CM

## 2013-08-22 DIAGNOSIS — I509 Heart failure, unspecified: Secondary | ICD-10-CM

## 2013-08-22 DIAGNOSIS — F32A Depression, unspecified: Secondary | ICD-10-CM

## 2013-08-22 DIAGNOSIS — I428 Other cardiomyopathies: Secondary | ICD-10-CM

## 2013-08-22 DIAGNOSIS — G4733 Obstructive sleep apnea (adult) (pediatric): Secondary | ICD-10-CM

## 2013-08-22 DIAGNOSIS — Z8679 Personal history of other diseases of the circulatory system: Secondary | ICD-10-CM

## 2013-08-22 DIAGNOSIS — E049 Nontoxic goiter, unspecified: Secondary | ICD-10-CM

## 2013-08-22 DIAGNOSIS — M7918 Myalgia, other site: Secondary | ICD-10-CM

## 2013-08-22 DIAGNOSIS — M1712 Unilateral primary osteoarthritis, left knee: Secondary | ICD-10-CM

## 2013-08-22 DIAGNOSIS — Z79899 Other long term (current) drug therapy: Secondary | ICD-10-CM | POA: Insufficient documentation

## 2013-08-22 DIAGNOSIS — M47816 Spondylosis without myelopathy or radiculopathy, lumbar region: Secondary | ICD-10-CM

## 2013-08-22 DIAGNOSIS — E119 Type 2 diabetes mellitus without complications: Secondary | ICD-10-CM | POA: Insufficient documentation

## 2013-08-22 DIAGNOSIS — F329 Major depressive disorder, single episode, unspecified: Secondary | ICD-10-CM

## 2013-08-22 DIAGNOSIS — I447 Left bundle-branch block, unspecified: Secondary | ICD-10-CM | POA: Insufficient documentation

## 2013-08-22 DIAGNOSIS — G8929 Other chronic pain: Secondary | ICD-10-CM | POA: Insufficient documentation

## 2013-08-22 HISTORY — PX: BI-VENTRICULAR IMPLANTABLE CARDIOVERTER DEFIBRILLATOR: SHX5459

## 2013-08-22 LAB — GLUCOSE, CAPILLARY
Glucose-Capillary: 253 mg/dL — ABNORMAL HIGH (ref 70–99)
Glucose-Capillary: 229 mg/dL — ABNORMAL HIGH (ref 70–99)
Glucose-Capillary: 256 mg/dL — ABNORMAL HIGH (ref 70–99)

## 2013-08-22 LAB — SURGICAL PCR SCREEN
MRSA, PCR: NEGATIVE
Staphylococcus aureus: POSITIVE — AB

## 2013-08-22 SURGERY — BI-VENTRICULAR IMPLANTABLE CARDIOVERTER DEFIBRILLATOR  (CRT-D)
Anesthesia: General

## 2013-08-22 MED ORDER — SPIRONOLACTONE 25 MG PO TABS
25.0000 mg | ORAL_TABLET | Freq: Every day | ORAL | Status: DC
  Administered 2013-08-22: 18:00:00 25 mg via ORAL
  Filled 2013-08-22 (×2): qty 1

## 2013-08-22 MED ORDER — FENTANYL CITRATE 0.05 MG/ML IJ SOLN
INTRAMUSCULAR | Status: DC | PRN
  Administered 2013-08-22: 150 ug via INTRAVENOUS

## 2013-08-22 MED ORDER — PHENYLEPHRINE HCL 10 MG/ML IJ SOLN
10.0000 mg | INTRAVENOUS | Status: DC | PRN
  Administered 2013-08-22: 10 ug/min via INTRAVENOUS

## 2013-08-22 MED ORDER — HYDROMORPHONE HCL PF 2 MG/ML IJ SOLN: 0.2500 mg | INTRAMUSCULAR | Status: DC | PRN

## 2013-08-22 MED ORDER — HEPARIN (PORCINE) IN NACL 2-0.9 UNIT/ML-% IJ SOLN
INTRAMUSCULAR | Status: AC
  Filled 2013-08-22: qty 500

## 2013-08-22 MED ORDER — INSULIN ASPART 100 UNIT/ML ~~LOC~~ SOLN
7.0000 [IU] | Freq: Three times a day (TID) | SUBCUTANEOUS | Status: DC
  Administered 2013-08-22 – 2013-08-23 (×4): 7 [IU] via SUBCUTANEOUS

## 2013-08-22 MED ORDER — INSULIN GLARGINE 100 UNIT/ML ~~LOC~~ SOLN
20.0000 [IU] | Freq: Every day | SUBCUTANEOUS | Status: DC
  Administered 2013-08-22 – 2013-08-23 (×2): 20 [IU] via SUBCUTANEOUS
  Filled 2013-08-22 (×3): qty 0.2

## 2013-08-22 MED ORDER — LIDOCAINE HCL (PF) 1 % IJ SOLN
INTRAMUSCULAR | Status: AC
  Filled 2013-08-22: qty 60

## 2013-08-22 MED ORDER — CHLORHEXIDINE GLUCONATE 4 % EX LIQD: 60.0000 mL | Freq: Once | CUTANEOUS | Status: DC

## 2013-08-22 MED ORDER — LACTATED RINGERS IV SOLN
INTRAVENOUS | Status: DC | PRN
  Administered 2013-08-22: 11:00:00 via INTRAVENOUS

## 2013-08-22 MED ORDER — PANTOPRAZOLE SODIUM 40 MG PO TBEC
40.0000 mg | DELAYED_RELEASE_TABLET | Freq: Every day | ORAL | Status: DC
  Administered 2013-08-22 – 2013-08-24 (×3): 40 mg via ORAL
  Filled 2013-08-22: qty 1

## 2013-08-22 MED ORDER — TRAMADOL HCL 50 MG PO TABS
50.0000 mg | ORAL_TABLET | Freq: Four times a day (QID) | ORAL | Status: DC | PRN
  Filled 2013-08-22: qty 1

## 2013-08-22 MED ORDER — FUROSEMIDE 40 MG PO TABS
40.0000 mg | ORAL_TABLET | Freq: Two times a day (BID) | ORAL | Status: DC
  Administered 2013-08-22 – 2013-08-24 (×4): 40 mg via ORAL
  Filled 2013-08-22 (×6): qty 1

## 2013-08-22 MED ORDER — MUPIROCIN 2 % EX OINT: TOPICAL_OINTMENT | Freq: Two times a day (BID) | CUTANEOUS | Status: DC

## 2013-08-22 MED ORDER — ASPIRIN EC 81 MG PO TBEC
81.0000 mg | DELAYED_RELEASE_TABLET | Freq: Every morning | ORAL | Status: DC
  Administered 2013-08-23 – 2013-08-24 (×2): 81 mg via ORAL
  Filled 2013-08-22 (×2): qty 1

## 2013-08-22 MED ORDER — OXYCODONE HCL 5 MG PO TABS
5.0000 mg | ORAL_TABLET | Freq: Three times a day (TID) | ORAL | Status: DC | PRN
  Administered 2013-08-22: 19:00:00 5 mg via ORAL

## 2013-08-22 MED ORDER — ISOSORBIDE MONONITRATE ER 30 MG PO TB24
30.0000 mg | ORAL_TABLET | Freq: Every day | ORAL | Status: DC
  Administered 2013-08-22 – 2013-08-24 (×2): 30 mg via ORAL
  Filled 2013-08-22 (×3): qty 1

## 2013-08-22 MED ORDER — OXYCODONE-ACETAMINOPHEN 5-325 MG PO TABS
1.0000 | ORAL_TABLET | Freq: Three times a day (TID) | ORAL | Status: DC | PRN
  Administered 2013-08-22: 1 via ORAL
  Filled 2013-08-22: qty 1

## 2013-08-22 MED ORDER — ACETAMINOPHEN 325 MG PO TABS: 325.0000 mg | ORAL_TABLET | ORAL | Status: DC | PRN

## 2013-08-22 MED ORDER — ALBUTEROL SULFATE HFA 108 (90 BASE) MCG/ACT IN AERS
2.0000 | INHALATION_SPRAY | Freq: Four times a day (QID) | RESPIRATORY_TRACT | Status: DC | PRN
  Filled 2013-08-22: qty 6.7

## 2013-08-22 MED ORDER — HYDRALAZINE HCL 25 MG PO TABS
25.0000 mg | ORAL_TABLET | Freq: Three times a day (TID) | ORAL | Status: DC
  Administered 2013-08-22 – 2013-08-24 (×4): 25 mg via ORAL
  Filled 2013-08-22 (×8): qty 1

## 2013-08-22 MED ORDER — LIDOCAINE HCL (CARDIAC) 20 MG/ML IV SOLN
INTRAVENOUS | Status: DC | PRN
  Administered 2013-08-22: 60 mg via INTRAVENOUS

## 2013-08-22 MED ORDER — CLONIDINE HCL 0.2 MG PO TABS
0.2000 mg | ORAL_TABLET | Freq: Two times a day (BID) | ORAL | Status: DC
  Administered 2013-08-22: 0.2 mg via ORAL
  Filled 2013-08-22 (×4): qty 1

## 2013-08-22 MED ORDER — ONDANSETRON HCL 4 MG/2ML IJ SOLN
4.0000 mg | Freq: Four times a day (QID) | INTRAMUSCULAR | Status: DC | PRN
  Administered 2013-08-22 (×2): 4 mg via INTRAVENOUS
  Filled 2013-08-22 (×2): qty 2

## 2013-08-22 MED ORDER — FUROSEMIDE 10 MG/ML IJ SOLN
INTRAMUSCULAR | Status: AC
  Filled 2013-08-22: qty 8

## 2013-08-22 MED ORDER — MUPIROCIN 2 % EX OINT
TOPICAL_OINTMENT | CUTANEOUS | Status: AC
  Administered 2013-08-22: 10:00:00
  Filled 2013-08-22: qty 22

## 2013-08-22 MED ORDER — FUROSEMIDE 10 MG/ML IJ SOLN
INTRAMUSCULAR | Status: DC | PRN
  Administered 2013-08-22 (×2): 20 mg via INTRAMUSCULAR

## 2013-08-22 MED ORDER — ROCURONIUM BROMIDE 100 MG/10ML IV SOLN
INTRAVENOUS | Status: DC | PRN
  Administered 2013-08-22: 50 mg via INTRAVENOUS

## 2013-08-22 MED ORDER — CYCLOBENZAPRINE HCL 10 MG PO TABS: 10.0000 mg | ORAL_TABLET | Freq: Three times a day (TID) | ORAL | Status: DC | PRN

## 2013-08-22 MED ORDER — IBUPROFEN 600 MG PO TABS
600.0000 mg | ORAL_TABLET | Freq: Four times a day (QID) | ORAL | Status: DC | PRN
  Filled 2013-08-22: qty 1

## 2013-08-22 MED ORDER — DIGOXIN 250 MCG PO TABS
0.2500 mg | ORAL_TABLET | Freq: Every day | ORAL | Status: DC
  Administered 2013-08-22 – 2013-08-24 (×3): 0.25 mg via ORAL
  Filled 2013-08-22 (×3): qty 1

## 2013-08-22 MED ORDER — GLYCOPYRROLATE 0.2 MG/ML IJ SOLN
INTRAMUSCULAR | Status: DC | PRN
  Administered 2013-08-22: 0.6 mg via INTRAVENOUS

## 2013-08-22 MED ORDER — CITALOPRAM HYDROBROMIDE 20 MG PO TABS
20.0000 mg | ORAL_TABLET | Freq: Every morning | ORAL | Status: DC
Start: 2013-08-23 — End: 2013-08-24
  Administered 2013-08-23 – 2013-08-24 (×2): 20 mg via ORAL
  Filled 2013-08-22 (×2): qty 1

## 2013-08-22 MED ORDER — OXYCODONE HCL 5 MG PO TABS
5.0000 mg | ORAL_TABLET | Freq: Three times a day (TID) | ORAL | Status: DC | PRN
  Administered 2013-08-23: 22:00:00 5 mg via ORAL
  Filled 2013-08-22 (×2): qty 1

## 2013-08-22 MED ORDER — MIDAZOLAM HCL 5 MG/5ML IJ SOLN
INTRAMUSCULAR | Status: DC | PRN
  Administered 2013-08-22 (×2): 1 mg via INTRAVENOUS

## 2013-08-22 MED ORDER — PROPOFOL 10 MG/ML IV BOLUS
INTRAVENOUS | Status: DC | PRN
  Administered 2013-08-22: 150 mg via INTRAVENOUS

## 2013-08-22 MED ORDER — SODIUM CHLORIDE 0.9 % IV SOLN: INTRAVENOUS | Status: AC

## 2013-08-22 MED ORDER — METOPROLOL SUCCINATE ER 50 MG PO TB24
150.0000 mg | ORAL_TABLET | Freq: Every day | ORAL | Status: DC
  Administered 2013-08-22: 18:00:00 150 mg via ORAL
  Filled 2013-08-22 (×2): qty 1

## 2013-08-22 MED ORDER — OXYCODONE-ACETAMINOPHEN 10-325 MG PO TABS: 1.0000 | ORAL_TABLET | Freq: Three times a day (TID) | ORAL | Status: DC | PRN

## 2013-08-22 MED ORDER — SIMVASTATIN 40 MG PO TABS
40.0000 mg | ORAL_TABLET | Freq: Every day | ORAL | Status: DC
  Administered 2013-08-22 – 2013-08-23 (×2): 40 mg via ORAL
  Filled 2013-08-22 (×3): qty 1

## 2013-08-22 MED ORDER — CEFAZOLIN SODIUM 1-5 GM-% IV SOLN
1.0000 g | Freq: Four times a day (QID) | INTRAVENOUS | Status: AC
  Administered 2013-08-22 – 2013-08-23 (×3): 1 g via INTRAVENOUS
  Filled 2013-08-22 (×3): qty 50

## 2013-08-22 MED ORDER — NEOSTIGMINE METHYLSULFATE 1 MG/ML IJ SOLN
INTRAMUSCULAR | Status: DC | PRN
  Administered 2013-08-22: 4 mg via INTRAVENOUS

## 2013-08-22 MED ORDER — ONDANSETRON HCL 4 MG/2ML IJ SOLN: 4.0000 mg | Freq: Once | INTRAMUSCULAR | Status: DC | PRN

## 2013-08-22 MED ORDER — HYDROMORPHONE HCL PF 2 MG/ML IJ SOLN
0.2500 mg | INTRAMUSCULAR | Status: DC | PRN
  Administered 2013-08-22: 0.5 mg via INTRAVENOUS

## 2013-08-22 MED ORDER — SODIUM CHLORIDE 0.9 % IV SOLN
INTRAVENOUS | Status: DC
  Administered 2013-08-22: 10:00:00 via INTRAVENOUS

## 2013-08-22 NOTE — CV Procedure (Signed)
MEHA VIDRINE [id number]  [id number]  Preop [id number]  LBBB CHF C3 Postop Dx same/   Procedure: CRT-D implant  Cx: None   Dictation number  [id number]  Sherryl Manges, MD 08/22/2013 1:05 PM

## 2013-08-22 NOTE — Op Note (Signed)
NAMETAILEY, TOP NO.:  [id number]  MEDICAL RECORD NO.:  [id number]  LOCATION:  6C04C                        FACILITY:  [facility]  PHYSICIAN:  [name], MD, FACCDATE OF BIRTH:  10/10/1962  DATE OF PROCEDURE:  08/22/2013 DATE OF DISCHARGE:  08/24/2013                              OPERATIVE REPORT   PREOPERATIVE DIAGNOSIS:  Nonischemic cardiomyopathy left bundle-branch block, and congestive heart failure.  POSTOPERATIVE DIAGNOSIS:  Nonischemic cardiomyopathy left bundle-branch block, and congestive heart failure; sinus tachycardia.  PROCEDURES:  Dual-chamber defibrillator implantation with left ventricular lead placement.  DESCRIPTION OF PROCEDURE:  On obtaining informed consent, the patient was brought to the electrophysiology laboratory and placed on the fluoroscopic table in a supine position.  After routine prep and drape of the left upper chest, lidocaine was infiltrated in prepectoral subclavicular region.  An incision was made and carried down to layer of the prepectoral fascia using electrocautery and sharp dissection.  A pocket was formed similarly.  Hemostasis was obtained.  Thereafter attention again to gain access to the extrathoracic left subclavian vein, which was accomplished without difficulty and without the aspiration of air.  Three separate venipunctures were accomplished. Guidewires were placed and retained and sequentially, and sequentially an 9-French, 9.5 [place] and 7-French sheath were placed, which were passed.  A Medtronic [id number] cm active fixation single coil defibrillator lead, still number [id number].  A Medtronic MB2 coronary sinus cannulation system and a Medtronic [id number] cm active fixation atrial lead sober PGN, [id number].  Under fluoroscopic guidance, the RV lead was manipulated to the apex where the bipolar R-wave was 15 with a pace impedance of 748, a threshold 0.5 and 0.5.  Currently,  threshold was 0.8 mA.  There  is no diaphragmatic pacing at 10 V and the current derangement was flat. Because of the weight of the lead, the leads were secured to the prepectoral fascia.  We then attempted to gain access to the coronary sinus, which was accomplished with only mild difficulty.  Venography demonstrated a low posterior branch, a mid lateral branch, and a high anterolateral branch. Sequentially the lateral branch and anterolateral branch were both cannulated with a wire; however, in either vein we able to pass the 3.5 [place] tipped 4298, a new LV quadripolar lead from Medtronic.  We then attempted to use the attain 2 system and could not pass the dilator either past the junction of the vein with the body of the coronary sinus.  We then proceeded to the low posterior branch and thankfully it actually coursed up on the lateral wall.  We were able to cannulate it and deployed the 50-98 to its distal ramification which was at the junction between the mid and distal 3rd.  The serial number is [id number].  This lead was secured to the prepectoral fascia.  The 9.5 and half [place] sheath was removed.  The atrial lead was then deployed.  The aforementioned atrial lead was deployed to the right atrial appendage where the bipolar P-wave was 1.2 with a pace impedance of 699. A threshold 0.7 or 0.5.  Currently threshold 1.0 mA, and the current of injury was brisk.  This lead was secured to the prepectoral  fascia.  The LV delivery system was then removed.  Fluoroscopy demonstrated a stable position.  The LV lead was secured to the prepectoral fascia and then the leads were attached to a Medtronic Revo Quattro XRT, CRT device, serial BLC [id number].  Through the device, bipolar P-wave was 1.6 with a pace impedance of 532 with threshold 0.5 at 0.4.  The R-wave was 17 with a pace impedance of 532 with threshold 0.5 at 0.4 and the LV impedance was 304 with a pacing threshold of less than 0.5 and 0.4. High-voltage  impedance was 63 ohms.  Two things need to be noted.  First; RV and LV electrogram interval was 85 milliseconds in the A1 and A2 configuration.  Also we had diaphragmatic pacing at 10 V through the tip to the LV tip to RV coil configuration at 10 V, but  the threshold was low and there were no other pacing options was lead was left.  The pocket was copiously irrigated with antibiotic containing saline solution.  Hemostasis was assured.  Leads in the pulse generator were placed in the pocket secured to the prepectoral fascia.  The wound was then closed in 3 layers in normal fashion.  The wound was washed, dried, and a benzoin Steri-Strip dressing was applied.  Needle counts, sponge counts, and instrument counts were correct the procedure according to staff.  The patient tolerated the procedure without apparent complication, and sinus.     [name], MD, [facility]     SCK/MEDQ  D:  08/22/2013  T:  08/22/2013  Job:  [id number]

## 2013-08-22 NOTE — H&P (View-Only) (Signed)
ELECTROPHYSIOLOGY CONSULT NOTE  Patient ID: [name], MRN: [id number], DOB/AGE: Sep 24, 1966 47 y.o. Admit date: (Not on file) Date of Consult: 08/01/2013  Primary Physician: Lars Masson, MD Primary Cardiologist: DM  Chief Complaint:  Consideration of CRT   HPI [name] is a 47 y.o. female  Referred for consideration of CRT D. Implantation.  She has left bundle branch block and catheterization 2012 demonstrated no obstructive disease. Ejection fraction by echo 12/13 demonstrated ejection fraction of 40-45%;  Echocardiogram 8/14 demonstrated EF of 20%. His deterioration has evolved despite ongoing guideline directed medical therapy. She has not been able to tolerate ACE/ARB secondary to angioedema.  She has significant congestive symptoms. She has orthopnea and nocturnal dyspnea. She has dyspnea on exertion at less than 100 feet and some resting dyspnea. Interestingly volume accumulation is manifested in her belly and in her feet but  Has normal RV function.  She has no history of syncope or palpitations.  She has treated obstructive sleep apnea.  She has significant back issues including lying flat.    Past Medical History  Diagnosis Date  . Hypertension     Poorly controlled. Has had HTN since age 64. Angioedema with ACEI.  24 Hr urine and renal arterial dopplers ordered . . . Never done  . Type II or unspecified type diabetes mellitus without mention of complication, uncontrolled DX: 2002  . NICM (nonischemic cardiomyopathy)     EF 45-50% in 8/12, cath 6/12 showed normal coronaries, EF 50-55% by LV gram  . Morbid obesity   . Allergic rhinitis   . HLD (hyperlipidemia)   . OSA on CPAP     sleep study in 8/12 showed moderate to severe OSA requiring CPAP  . Chronic lower back pain     secondary to DJD, obsetiy, hip problems. Followed by Dr. Ivory Broad (pain management)  . 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 Chronic diastolic heart  failure      Primarily diastolic CHF: Likely due to uncontrolled HTN. Last echo (8/12) with EF 45-50%, mild to moderate LVH with some asymmetric septal hypertrophy, RV normal size and systolic function. EF 50-55% by LV-gram in 6/12.   . Degeneration of lumbar or lumbosacral intervertebral disc   . Polyneuropathy in diabetes(357.2)   . Thoracic or lumbosacral neuritis or radiculitis, unspecified   . Calcifying tendinitis of shoulder   . LBBB (left bundle branch block)   . Chronic combined systolic and diastolic CHF (congestive heart failure)     EF 40-45% by echo 12/06/2012  . Coronary artery disease     questionable. LHC 05/2011 showing normal coronaries // Followed at [facility], Dr. Shirlee Latch  . GERD (gastroesophageal reflux disease)       Surgical History:  Past Surgical History  Procedure Laterality Date  . Breast biopsies Bilateral 2011    patient reports benign results  . Tubal ligation  05/31/1985  . Hand surgery Left   . Shoulder surgery Right   . Dental surgery      tumors removed   . Cardiac catheterization  05/2011     Home Meds: Prior to Admission medications   Medication Sig Start Date End Date Taking? Authorizing Provider  albuterol (VENTOLIN HFA) 108 (90 BASE) MCG/ACT inhaler Inhale 2 puffs into the lungs every 6 (six) hours as needed for wheezing or shortness of breath. For wheezing 06/06/13  Yes Bronson Curb, MD  aspirin EC 81 MG tablet Take 1 tablet (  81 mg total) by mouth every morning. 06/06/13  Yes Bronson Curb, MD  citalopram (CELEXA) 20 MG tablet Take 1 tablet (20 mg total) by mouth every morning. 06/06/13  Yes Bronson Curb, MD  cloNIDine (CATAPRES) 0.2 MG tablet Take 1 tablet (0.2 mg total) by mouth 2 (two) times daily. 06/06/13 06/06/14 Yes Bronson Curb, MD  cyclobenzaprine (FLEXERIL) 10 MG tablet Take 1 tablet (10 mg total) by mouth 3 (three) times daily as needed for muscle spasms. 06/25/13  Yes Ranelle Oyster, MD  diclofenac sodium (VOLTAREN) 1 %  GEL Apply 2 g topically 4 (four) times daily as needed (for pain).   Yes Historical Provider, MD  digoxin (LANOXIN) 0.25 MG tablet Take 1 tablet (0.25 mg total) by mouth daily. 07/19/13  Yes Aundria Rud, NP  furosemide (LASIX) 40 MG tablet Take 1 tablet (40 mg total) by mouth 2 (two) times daily. 06/06/13  Yes Bronson Curb, MD  hydrALAZINE (APRESOLINE) 25 MG tablet Take 1 tablet (25 mg total) by mouth 3 (three) times daily. 07/19/13  Yes Aundria Rud, NP  ibuprofen (ADVIL,MOTRIN) 200 MG tablet Take 600 mg by mouth every 6 (six) hours as needed for pain.   Yes Historical Provider, MD  insulin aspart (NOVOLOG FLEXPEN) 100 UNIT/ML injection Inject 7 Units into the skin 3 (three) times daily before meals. 04/09/13  Yes Maitri S Kalia-Reynolds, DO  insulin glargine (LANTUS SOLOSTAR) 100 UNIT/ML injection Inject 0.2 mLs (20 Units total) into the skin at bedtime. DX: 250.02. Will need 3 pens/month 04/09/13  Yes Maitri S Kalia-Reynolds, DO  Insulin Pen Needle 31G X 5 MM MISC Use to inject insulin 4 times a day 07/12/13  Yes Burns Spain, MD  isosorbide mononitrate (IMDUR) 30 MG 24 hr tablet Take 1 tablet (30 mg total) by mouth daily. 07/19/13  Yes Aundria Rud, NP  metoprolol succinate (TOPROL-XL) 100 MG 24 hr tablet Take 1 tablet (100 mg total) by mouth daily. Take with or immediately following a meal. 06/06/13  Yes Bronson Curb, MD  Misc. Devices (ROLLATOR) MISC Use daily for ambulation 06/25/13  Yes Ranelle Oyster, MD  oxyCODONE-acetaminophen (PERCOCET) 10-325 MG per tablet Take 1 tablet by mouth every 8 (eight) hours as needed. For pain 07/23/13  Yes Ranelle Oyster, MD  pantoprazole (PROTONIX) 40 MG tablet Take 1 tablet (40 mg total) by mouth daily. 06/06/13  Yes Bronson Curb, MD  polyethylene glycol [facility] / GLYCOLAX) packet Take 17 g by mouth daily as needed (constipation). 06/06/13  Yes Bronson Curb, MD  simvastatin (ZOCOR) 40 MG tablet Take 1 tablet (40 mg total) by mouth daily at 6 PM.  07/24/13  Yes Dow Adolph, MD  spironolactone (ALDACTONE) 25 MG tablet Take 1 tablet (25 mg total) by mouth daily. 06/06/13 06/06/14 Yes Bronson Curb, MD  traMADol (ULTRAM) 50 MG tablet Take 1 tablet (50 mg total) by mouth every 6 (six) hours as needed for pain. 04/02/13  Yes Ranelle Oyster, MD   a Allergies:  Allergies  Allergen Reactions  . Clindamycin Anaphylaxis and Swelling    Swelling of pain  . Enalapril Maleate Swelling  . Lisinopril Swelling    History   Social History  . Marital Status: Single    Spouse Name: N/A    Number of Children: 2  . Years of Education: 9th grade   Occupational History  . Unemployed     planning on getting disability   Social History Main Topics  . Smoking status: Never  Smoker   . Smokeless tobacco: Never Used  . Alcohol Use: No  . Drug Use: No  . Sexual Activity: Yes    Birth Control/ Protection: Surgical   Other Topics Concern  . Not on file   Social History Narrative   Lives in [place] with her son. Is able to read and write fluently in [place].     Family History  Problem Relation Age of Onset  . Heart disease Mother 16    Died of MI at age 22 yo  . Heart disease Paternal Grandmother     requiring pacemaker.  [name] Heart disease Paternal Grandfather 21    Died of MI at possibly age 3-53yo  . Stroke Paternal Grandfather   . Heart disease Father 51    MI age 78yo requiring stenting  . Kidney disease Mother     requiring dialysis  . Congestive Heart Failure Mother   . Diabetes Father   . Diabetes Brother   . Heart disease Brother 41    MI at age 68 years old  . Breast cancer Paternal Aunt   . Breast cancer Maternal Grandmother      ROS:  Please see the history of present illness.   Negative except stress  All other systems reviewed and negative.    Physical Exam:   Blood pressure 146/93, pulse 110, height 5\' 9"  (1.753 m), weight 273 lb (123.832 kg), last menstrual period 07/23/2013. General: Well developed,  well nourished obese age appearing African American female in no acute distress. Head: Normocephalic, atraumatic, sclera non-icteric, no xanthomas, nares are without discharge. EENT: normal Lymph Nodes:  none Back: without scoliosis/kyphosis , no CVA tendersness Neck: Negative for carotid bruits. JVD 6-7 cm; there is a large goiter Lungs: Clear bilaterally to auscultation without wheezes, rales, or rhonchi. Breathing is unlabored. Heart: RRR with S1 S2. no murmur , rubs, or gallops appreciated. Abdomen: Soft, non-tender, non-distended with normoactive bowel sounds. No hepatomegaly. No rebound/guarding. No obvious abdominal masses. Msk:  Strength and tone appear normal for age. Extremities: No clubbing or cyanosis. no edema.  Distal pedal pulses are 2+ and equal bilaterally. Skin: Warm and Dry Neuro: Alert and oriented X 3. CN III-XII intact Grossly normal sensory and motor function . Psych:  Responds to questions appropriately with a normal affect.      Labs: Cardiac Enzymes No results found for this basename: CKTOTAL, CKMB, TROPONINI,  in the last 72 hours CBC Lab Results  Component Value Date   WBC 11.8* 04/27/2013   HGB 12.4 04/27/2013   HCT 37.0 04/27/2013   MCV 82.0 04/27/2013   PLT 243 04/27/2013   PROTIME: No results found for this basename: LABPROT, INR,  in the last 72 hours Chemistry No results found for this basename: NA, K, CL, CO2, BUN, CREATININE, CALCIUM, LABALBU, PROT, BILITOT, ALKPHOS, ALT, AST, GLUCOSE,  in the last 168 hours Lipids Lab Results  Component Value Date   CHOL 194 12/07/2012   HDL 47 12/07/2012   LDLCALC 102* 12/07/2012   TRIG 224* 12/07/2012   BNP Pro B Natriuretic peptide (BNP)  Date/Time Value Range Status  07/09/2013  4:12 PM 230.0* 0.0 - 100.0 pg/mL Final  04/27/2013 12:50 AM 608.2* 0 - 125 pg/mL Final  04/09/2013  5:56 AM 516.7* <126 pg/mL Final  02/24/2013  7:05 PM 424.3* 0 - 125 pg/mL Final   Miscellaneous Lab Results  Component Value  Date   DDIMER 0.38 06/10/2011    Radiology/Studies:  [employer] Digital Screening  07/31/2013   *RADIOLOGY REPORT*  Clinical Data: Screening.  DIGITAL SCREENING BILATERAL MAMMOGRAM WITH CAD  Comparison:  Previous exam(s).  FINDINGS:  ACR Breast Density Category b:  There are scattered areas of fibroglandular density.  There are no findings suspicious for malignancy.  Images were processed with CAD.  IMPRESSION: No mammographic evidence of malignancy.  A result letter of this screening mammogram will be mailed directly to the patient.  RECOMMENDATION: Screening mammogram in one year. (Code:SM-B-01Y)  BI-RADS CATEGORY 1:  Negative.   Original Report Authenticated By: Hulan Saas, M.D.    EKG: sinus rhythm at 110 beats per minutes Intervals 14/15/40 Axis leftward at -4 Left bundle branch block   Assessment and Plan: *   Sherryl Manges

## 2013-08-22 NOTE — Interval H&P Note (Signed)
ICD Criteria  Current LVEF (within 6 months):20%  NYHA Functional Classification: Class III  Heart Failure History:  Yes, Duration of heart failure since onset is > 9 months  Non-Ischemic Dilated Cardiomyopathy History:  Yes, timeframe is 3 to 9 months  Atrial Fibrillation/Atrial Flutter:  No.  Ventricular Tachycardia History:  No.  Cardiac Arrest History:  No  History of Syndromes with Risk of Sudden Death:  No.  Previous ICD:  No.  Electrophysiology Study: No.  Anticoagulation Therapy:  Patient is NOT on anticoagulation therapy.   Beta Blocker Therapy:  Yes.   Ace Inhibitor/ARB Therapy:  No, Reason not on Ace Inhibitor/ARB therapy:  angioedemaHistory and Physical Interval Note:  08/22/2013 9:14 AM  [name]  has presented today for surgery, with the diagnosis of CARDIOMYOPATHY  The various methods of treatment have been discussed with the patient and family. After consideration of risks, benefits and other options for treatment, the patient has consented to  Procedure(s): BI-VENTRICULAR IMPLANTABLE CARDIOVERTER DEFIBRILLATOR  (CRT-D) (N/A) as a surgical intervention .  The patient's history has been reviewed, patient examined, no change in status, stable for surgery.  I have reviewed the patient's chart and labs.  Questions were answered to the patient's satisfaction.     Sherryl Manges

## 2013-08-22 NOTE — Anesthesia Postprocedure Evaluation (Signed)
  Anesthesia Post-op Note  Patient: [name]  Procedure(s) Performed: Procedure(s): BI-VENTRICULAR IMPLANTABLE CARDIOVERTER DEFIBRILLATOR  (CRT-D) (N/A)  Patient Location: PACU  Anesthesia Type:General  Level of Consciousness: awake, alert , oriented and patient cooperative  Airway and Oxygen Therapy: Patient Spontanous Breathing  Post-op Pain: mild  Post-op Assessment: Post-op Vital signs reviewed, Patient's Cardiovascular Status Stable, Respiratory Function Stable, Patent Airway, No signs of Nausea or vomiting and Pain level controlled  Post-op Vital Signs: stable  Complications: No apparent anesthesia complications

## 2013-08-22 NOTE — Transfer of Care (Signed)
Immediate Anesthesia Transfer of Care Note  Patient: [name]  Procedure(s) Performed: Procedure(s): BI-VENTRICULAR IMPLANTABLE CARDIOVERTER DEFIBRILLATOR  (CRT-D) (N/A)  Patient Location: PACU  Anesthesia Type:General  Level of Consciousness: awake, alert  and sedated  Airway & Oxygen Therapy: Patient connected to face mask oxygen  Post-op Assessment: Report given to PACU RN and Post -op Vital signs reviewed and stable  Post vital signs: stable  Complications: No apparent anesthesia complications

## 2013-08-22 NOTE — Anesthesia Preprocedure Evaluation (Addendum)
Anesthesia Evaluation  Patient identified by MRN, date of birth, ID band Patient awake    Reviewed: Allergy & Precautions, H&P , NPO status , Patient's Chart, lab work & pertinent test results, reviewed documented beta blocker date and time   Airway Mallampati: II TM Distance: >3 FB     Dental  (+) Edentulous Upper and Dental Advisory Given   Pulmonary shortness of breath and with exertion, sleep apnea ,  breath sounds clear to auscultation        Cardiovascular hypertension, + CAD and +CHF + dysrhythmias Rate:Normal     Neuro/Psych PSYCHIATRIC DISORDERS  Neuromuscular disease    GI/Hepatic GERD-  Controlled and Medicated,  Endo/Other  diabetes, Well Controlled, Type 2, Insulin Dependent and Oral Hypoglycemic AgentsMorbid obesity  Renal/GU      Musculoskeletal   Abdominal (+)  Abdomen: soft. Bowel sounds: normal.  Peds  Hematology   Anesthesia Other Findings   Reproductive/Obstetrics                       Anesthesia Physical Anesthesia Plan  ASA: IV  Anesthesia Plan: General   Post-op Pain Management:    Induction: Intravenous  Airway Management Planned: Oral ETT  Additional Equipment:   Intra-op Plan:   Post-operative Plan: Extubation in OR  Informed Consent: I have reviewed the patients History and Physical, chart, labs and discussed the procedure including the risks, benefits and alternatives for the proposed anesthesia with the patient or authorized representative who has indicated his/her understanding and acceptance.     Plan Discussed with: CRNA, Anesthesiologist and Surgeon  Anesthesia Plan Comments:         Anesthesia Quick Evaluation

## 2013-08-22 NOTE — Anesthesia Procedure Notes (Signed)
Procedure Name: Intubation Date/Time: 08/22/2013 10:59 AM Performed by: Ellin Goodie Pre-anesthesia Checklist: Patient identified, Emergency Drugs available, Suction available, Patient being monitored and Timeout performed Patient Re-evaluated:Patient Re-evaluated prior to inductionOxygen Delivery Method: Circle system utilized Preoxygenation: Pre-oxygenation with 100% oxygen Intubation Type: IV induction Ventilation: Mask ventilation without difficulty Laryngoscope Size: Mac and 3 Grade View: Grade I Tube type: Oral Tube size: 7.5 mm Number of attempts: 1 Airway Equipment and Method: Stylet Placement Confirmation: ETT inserted through vocal cords under direct vision,  positive ETCO2 and breath sounds checked- equal and bilateral Secured at: 23 cm Tube secured with: Tape Dental Injury: Teeth and Oropharynx as per pre-operative assessment  Comments: Easy atraumatic induction and intubation with MAC 3 blade.  Dr Katrinka Blazing verified placement of ETT.  Carlynn Herald, CRNA

## 2013-08-23 ENCOUNTER — Ambulatory Visit (HOSPITAL_COMMUNITY): Payer: Medicaid Other

## 2013-08-23 ENCOUNTER — Encounter (HOSPITAL_COMMUNITY): Payer: Self-pay | Admitting: *Deleted

## 2013-08-23 DIAGNOSIS — I428 Other cardiomyopathies: Secondary | ICD-10-CM

## 2013-08-23 DIAGNOSIS — I519 Heart disease, unspecified: Secondary | ICD-10-CM

## 2013-08-23 LAB — GLUCOSE, CAPILLARY: Glucose-Capillary: 154 mg/dL — ABNORMAL HIGH (ref 70–99)

## 2013-08-23 MED ORDER — CLONIDINE HCL 0.1 MG PO TABS
0.1000 mg | ORAL_TABLET | Freq: Two times a day (BID) | ORAL | Status: DC
  Administered 2013-08-23 – 2013-08-24 (×2): 0.1 mg via ORAL
  Filled 2013-08-23 (×4): qty 1

## 2013-08-23 MED ORDER — PROMETHAZINE HCL 25 MG/ML IJ SOLN
25.0000 mg | Freq: Four times a day (QID) | INTRAMUSCULAR | Status: DC | PRN
  Administered 2013-08-23: 25 mg via INTRAVENOUS
  Filled 2013-08-23: qty 1

## 2013-08-23 MED ORDER — YOU HAVE A PACEMAKER BOOK
Freq: Once | Status: AC
  Administered 2013-08-23: 02:00:00
  Filled 2013-08-23: qty 1

## 2013-08-23 MED ORDER — METOPROLOL SUCCINATE ER 100 MG PO TB24
100.0000 mg | ORAL_TABLET | Freq: Every day | ORAL | Status: DC
  Filled 2013-08-23: qty 1

## 2013-08-23 MED ORDER — METOPROLOL SUCCINATE ER 50 MG PO TB24
50.0000 mg | ORAL_TABLET | Freq: Every day | ORAL | Status: DC
  Administered 2013-08-23 – 2013-08-24 (×2): 50 mg via ORAL
  Filled 2013-08-23 (×2): qty 1

## 2013-08-23 MED ORDER — SPIRONOLACTONE 12.5 MG HALF TABLET
12.5000 mg | ORAL_TABLET | Freq: Every day | ORAL | Status: DC
  Administered 2013-08-24: 10:00:00 12.5 mg via ORAL
  Filled 2013-08-23 (×2): qty 1

## 2013-08-23 MED FILL — Hydromorphone HCl Inj 1 MG/ML: INTRAMUSCULAR | Qty: 1 | Status: AC

## 2013-08-23 NOTE — Discharge Summary (Signed)
ELECTROPHYSIOLOGY DISCHARGE SUMMARY    Patient ID: [name],  MRN: [id number], DOB/AGE: 02/07/1966 47 y.o.  Admit date: 08/22/2013 Discharge date: 08/24/2013  Primary Care Physician: Lars Masson, MD Primary Cardiologist / EP: Shirlee Latch, MD / Graciela Husbands, MD  Primary Discharge Diagnosis:  1. NICM with NYHA class III HF and LBBB, now s/p BiV ICD implantation for CRT and primary prevention SCD 2. Hypotension, resolved  Secondary Discharge Diagnoses:  1. Chronic combined systolic and diastolic HF 2. LBBB 3. HTN 4. Dyslipidemia 5. Obesity 6. OSA 7. DM 8. Chronic back pain 9. GERD  Procedures This Admission:  1. BiV ICD implantation (CRT-D) performed 08/22/2013 - [employer] (please see operative report for full details; this is not available at the time of dictation)  2. Post-op echo 08/23/2013 Study Conclusions - Left ventricle: The cavity size was normal. Systolic function was mildly to moderately reduced. The estimated ejection fraction was in the range of 40% to 45%. Diffuse hypokinesis. - Pericardium, extracardiac: A trivial pericardial effusion was identified posterior to the heart. Features were not consistent with tamponade physiology.   History and Hospital Course:  [name] is a 47 year old woman with a NICM, EF 20%, chronic combined systolic and diastolic HF, LBBB, HTN, dyslipidemia, DM, OSA and obesity who has had persistent LV dysfunction and NYHA class III HF despite optimal medical therapy. Therefore she presented yesterday and underwent BiV ICD implantation for CRT and primary prevention of SCD. Ms. Luthi tolerated this procedure well without any immediate complication. Her chest xray shows stable lead placement without pneumothorax. Her device interrogation shows normal BiV ICD function with stable lead parameters/measurements. Her implant site is intact without significant bleeding or hematoma. This AM she was hypotensive and an echocardiogram was done to rule out  pericardial perforation / effusion. The post-op echo showed improved LVEF to 40-45% and trivial pericardial effusion, not felt to be clinically significant. She remained for observation. Her hypotension resolved and was felt to be related to CRT response and medications. Her medications were adjusted accordingly. Clonidine and spironolactone were discontinued. Metoprolol was reduced to 50 mg once daily. Hydralazine and Imdur were continued. She remains hemodynamically stable and afebrile. She is ambulating without difficulty. She has been given discharge instructions including wound care and activity restrictions. She will follow-up in 10 days for wound check and medication follow-up. She has been seen, examined and deemed stable for discharge today by Dr. Hillis Range.  Physical Exam: Vitals: Blood pressure 136/91, pulse 84, temperature 98.1 F (36.7 C), temperature source Oral, resp. rate 18, height 5\' 9"  (1.753 m), weight 279 lb 1.6 oz (126.6 kg), last menstrual period 07/23/2013, SpO2 99.00%.  General: Well developed, well appearing 47 year old female in no acute distress.  Neck: Supple. JVD not elevated.  Lungs: Clear bilaterally to auscultation without wheezes, rales, or rhonchi. Breathing is unlabored.  Heart: RRR S1 S2 without murmurs, rubs, or gallops.  Abdomen: Soft, non-distended.  Extremities: No clubbing or cyanosis. No edema. Distal pedal pulses are 2+ and equal bilaterally.  Neuro: Alert and oriented X 3. Moves all extremities spontaneously. No focal deficits.  Skin: Left upper chest / implant site intact without bleeding or hematoma.   Labs: Lab Results  Component Value Date   WBC 7.1 08/08/2013   HGB 11.9* 08/08/2013   HCT 35.7* 08/08/2013   MCV 84.9 08/08/2013   PLT 216.0 08/08/2013      Component Value Date/Time   NA 135 08/08/2013 1012   K 3.7 08/08/2013  1012   CL 102 08/08/2013 1012   CO2 29 08/08/2013 1012   GLUCOSE 182* 08/08/2013 1012   BUN 8 08/08/2013 1012   CREATININE  0.8 08/08/2013 1012   CREATININE 0.86 06/06/2013 1652   CALCIUM 8.9 08/08/2013 1012   GFRNONAA 73* 04/30/2013 0500   GFRAA 84* 04/30/2013 0500   Digoxin level 0.5   Disposition:  The patient is being discharged in stable condition.  Follow-up: Follow-up Information   Follow up with [facility] On 08/31/2013. (At 11:30 AM for wound check and BP follow-up (will see Nehemiah Settle, Dr. Odessa Fleming PA))    Specialty:  Electrophysiology   Contact information:   [address], [address] [place] [address] [phone]      Follow up with Marca Ancona, MD On 10/02/2013. (At 11:45 AM)    Specialty:  Cardiology   Contact information:   [address] [place] [address] [phone]      Follow up with Sherryl Manges, MD On 11/23/2013. (At 11:45 AM)    Specialty:  Cardiology   Contact information:   [address] [place] [address] [phone]    Discharge Medications:    Medication List    STOP taking these medications       cloNIDine 0.2 MG tablet  Commonly known as:  CATAPRES     spironolactone 25 MG tablet  Commonly known as:  ALDACTONE      TAKE these medications       ACCU-CHEK NANO SMARTVIEW W/DEVICE Kit  1 each by Does not apply route 3 (three) times daily with meals.     albuterol 108 (90 BASE) MCG/ACT inhaler  Commonly known as:  PROVENTIL HFA;VENTOLIN HFA  Inhale 2 puffs into the lungs every 6 (six) hours as needed for wheezing or shortness of breath. For wheezing     aspirin EC 81 MG tablet  Take 81 mg by mouth every morning.     citalopram 20 MG tablet  Commonly known as:  CELEXA  Take 20 mg by mouth every morning.     cyclobenzaprine 10 MG tablet  Commonly known as:  FLEXERIL  Take 10 mg by mouth 3 (three) times daily as needed for muscle spasms.     diclofenac sodium 1 % Gel  Commonly known as:  VOLTAREN  Apply 2 g topically 4 (four) times daily as needed (for pain).     digoxin 0.25  MG tablet  Commonly known as:  LANOXIN  Take 0.25 mg by mouth daily.     furosemide 40 MG tablet  Commonly known as:  LASIX  Take 40 mg by mouth 2 (two) times daily.     hydrALAZINE 25 MG tablet  Commonly known as:  APRESOLINE  Take 25 mg by mouth 3 (three) times daily.     ibuprofen 200 MG tablet  Commonly known as:  ADVIL,MOTRIN  Take 600 mg by mouth every 6 (six) hours as needed for pain.     insulin aspart 100 UNIT/ML injection  Commonly known as:  NOVOLOG FLEXPEN  Inject 7 Units into the skin 3 (three) times daily before meals.     insulin glargine 100 UNIT/ML injection  Commonly known as:  LANTUS SOLOSTAR  Inject 0.2 mLs (20 Units total) into the skin at bedtime. DX: 250.02. Will need 3 pens/month     Insulin Pen Needle 31G X 5 MM Misc  Use to inject insulin 4 times a day  isosorbide mononitrate 30 MG 24 hr tablet  Commonly known as:  IMDUR  Take 30 mg by mouth daily.     metoprolol succinate 50 MG 24 hr tablet  Commonly known as:  TOPROL-XL  Take 1 tablet (50 mg total) by mouth daily.     oxyCODONE-acetaminophen 10-325 MG per tablet  Commonly known as:  PERCOCET  Take 1 tablet by mouth every 8 (eight) hours as needed for pain. For pain     pantoprazole 40 MG tablet  Commonly known as:  PROTONIX  Take 40 mg by mouth daily.     Rollator Misc  Use daily for ambulation     simvastatin 40 MG tablet  Commonly known as:  ZOCOR  Take 40 mg by mouth daily at 6 PM.     traMADol 50 MG tablet  Commonly known as:  ULTRAM  Take 1 tablet (50 mg total) by mouth every 6 (six) hours as needed for pain.       Duration of Discharge Encounter: Greater than 30 minutes including physician time.  Signed, EDMISTEN, Nehemiah Settle, PA-C 08/24/2013, 9:55 AM   Pt s/p MDT VIVA XT BiV ICD.  Pt seen, examined, and above is reviewed.  At this time she is alert, ambulatory, and hemodynamically stable.  No chest pain, SOB, or other concerns.  Dizziness is resolved.  She will discharge  to home with close outpatient follow-up by Dr Graciela Husbands and staff.  She is instructed to return immediately should she develop any change in her condiction or concerns.  Hillis Range, MD

## 2013-08-23 NOTE — Progress Notes (Signed)
Echocardiogram 2D Echocardiogram has been performed.  [name] 08/23/2013, 9:46 AM

## 2013-08-23 NOTE — Progress Notes (Signed)
Patient request to leave floor to go visit dying friend on unit 2H. I notified Jarold Motto. and clarified it with the A.C. Patient request was granted. She was assisted off the floor and remained on telemetry during that time less than one hour. Patient tolerated it well and no complications. Patient very thankful and appreciate everyone's effort. Patient will continue to stay on monitor over night.

## 2013-08-23 NOTE — Progress Notes (Signed)
Patient Care Team: Lars Masson, MD as PCP - General   HPI  [name] is a 47 y.o. female Nauseated over night  Without complaints of chest pain or shortness of breath   Past Medical History  Diagnosis Date  . Hypertension     Poorly controlled. Has had HTN since age 89. Angioedema with ACEI.  24 Hr urine and renal arterial dopplers ordered . . . Never done  . Type II or unspecified type diabetes mellitus without mention of complication, uncontrolled DX: 2002  . NICM (nonischemic cardiomyopathy)     EF 45-50% in 8/12, cath 6/12 showed normal coronaries, EF 50-55% by LV gram  . Morbid obesity   . Allergic rhinitis   . HLD (hyperlipidemia)   . OSA on CPAP     sleep study in 8/12 showed moderate to severe OSA requiring CPAP  . Chronic lower back pain     secondary to DJD, obsetiy, hip problems. Followed by Dr. Ivory Broad (pain management)  . 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 Chronic diastolic heart failure      Primarily diastolic CHF: Likely due to uncontrolled HTN. Last echo (8/12) with EF 45-50%, mild to moderate LVH with some asymmetric septal hypertrophy, RV normal size and systolic function. EF 50-55% by LV-gram in 6/12.   . Degeneration of lumbar or lumbosacral intervertebral disc   . Polyneuropathy in diabetes(357.2)   . Thoracic or lumbosacral neuritis or radiculitis, unspecified   . Calcifying tendinitis of shoulder   . LBBB (left bundle branch block)   . Chronic combined systolic and diastolic CHF (congestive heart failure)     EF 40-45% by echo 12/06/2012  . Coronary artery disease     questionable. LHC 05/2011 showing normal coronaries // Followed at [facility], Dr. Shirlee Latch  . GERD (gastroesophageal reflux disease)     Past Surgical History  Procedure Laterality Date  . Breast biopsies Bilateral 2011    patient reports benign results  . Tubal ligation  05/31/1985  . Hand surgery Left   . Shoulder surgery Right   . Dental  surgery      tumors removed   . Cardiac catheterization  05/2011    Current Facility-Administered Medications  Medication Dose Route Frequency Provider Last Rate Last Dose  . acetaminophen (TYLENOL) tablet 325-650 mg  325-650 mg Oral Q4H PRN [name], MD      . albuterol (PROVENTIL HFA;VENTOLIN HFA) 108 (90 BASE) MCG/ACT inhaler 2 puff  2 puff Inhalation Q6H PRN [name], MD      . aspirin EC tablet 81 mg  81 mg Oral q morning - 10a [name], MD      . citalopram (CELEXA) tablet 20 mg  20 mg Oral q morning - 10a [name], MD      . cloNIDine (CATAPRES) tablet 0.2 mg  0.2 mg Oral BID [name], MD   0.2 mg at 08/22/13 1747  . cyclobenzaprine (FLEXERIL) tablet 10 mg  10 mg Oral TID PRN [name], MD      . digoxin Margit Banda) tablet 0.25 mg  0.25 mg Oral Daily [name], MD   0.25 mg at 08/22/13 1748  . furosemide (LASIX) tablet 40 mg  40 mg Oral BID [name], MD   40 mg at 08/22/13 1748  . hydrALAZINE (APRESOLINE) tablet 25 mg  25 mg Oral TID [name], MD  25 mg at 08/22/13 1749  . ibuprofen (ADVIL,MOTRIN) tablet 600 mg  600 mg Oral Q6H PRN [name], MD      . insulin aspart (novoLOG) injection 7 Units  7 Units Subcutaneous TID AC [name], MD   7 Units at 08/22/13 1901  . insulin glargine (LANTUS) injection 20 Units  20 Units Subcutaneous QHS [name], MD   20 Units at 08/22/13 2215  . isosorbide mononitrate (IMDUR) 24 hr tablet 30 mg  30 mg Oral Daily [name], MD   30 mg at 08/22/13 1750  . metoprolol succinate (TOPROL-XL) 24 hr tablet 150 mg  150 mg Oral Daily [name], MD   150 mg at 08/22/13 1750  . ondansetron (ZOFRAN) injection 4 mg  4 mg Intravenous Q6H PRN [name], MD   4 mg at 08/22/13 2334  . oxyCODONE (Oxy IR/ROXICODONE) immediate release tablet 5 mg  5 mg Oral Q8H PRN [name], MD      . oxyCODONE-acetaminophen (PERCOCET/ROXICET) 5-325 MG per tablet 1 tablet  1 tablet Oral Q8H PRN [name], MD   1 tablet at 08/22/13 1901   And  . oxyCODONE (Oxy IR/ROXICODONE) immediate release tablet 5 mg  5 mg Oral Q8H PRN [name], MD   5 mg at 08/22/13 1901  . pantoprazole (PROTONIX) EC tablet 40 mg  40 mg Oral Daily [name], MD   40 mg at 08/22/13 1902  . promethazine (PHENERGAN) injection 25 mg  25 mg Intravenous Q6H PRN [place]on C. Terressa Koyanagi, MD   25 mg at 08/23/13 0329  . simvastatin (ZOCOR) tablet 40 mg  40 mg Oral q1800 [name], MD   40 mg at 08/22/13 1749  . spironolactone (ALDACTONE) tablet 25 mg  25 mg Oral Daily [name], MD   25 mg at 08/22/13 1747  . traMADol (ULTRAM) tablet 50 mg  50 mg Oral Q6H PRN [name], MD        Allergies  Allergen Reactions  . Clindamycin Anaphylaxis and Swelling    Swelling of pain  . Enalapril Maleate Swelling  . Lisinopril Swelling    Review of Systems negative except from HPI and PMH  Physical Exam BP 93/51  Pulse 78  Temp(Src) 97.8 F (36.6 C) (Axillary)  Resp 18  Ht 5\' 9"  (1.753 m)  Wt 281 lb 12 oz (127.8 kg)  BMI 41.59 kg/m2  SpO2 100%  LMP 07/23/2013 Well developed and well nourished in no acute distress HENT normal E scleral and icterus clear Neck Supple JVP flat; carotids brisk and full Clear to ausculation Pocket without hematoma Regular rate and rhythm, no murmurs gallops or rub Soft with active bowel sounds No clubbing cyanosis none Edema Alert and oriented, grossly normal motor and sensory function Skin Warm and Dry  CXR okay  Post op check normal  Assessment and  Plan  S/P  CRT  NICM  HTN-poorly controlled   Sinus tach  Post op nausea and vominting   Hypotension concerning post implant,  Will check echo although improvemnet of sinus tach suggests that hemodynamics were improved via  CRT Will downtitrate meds Mobilize and anticipate discharge this afternoon

## 2013-08-24 ENCOUNTER — Other Ambulatory Visit: Payer: Self-pay | Admitting: Internal Medicine

## 2013-08-24 DIAGNOSIS — E049 Nontoxic goiter, unspecified: Secondary | ICD-10-CM

## 2013-08-24 LAB — GLUCOSE, CAPILLARY: Glucose-Capillary: 159 mg/dL — ABNORMAL HIGH (ref 70–99)

## 2013-08-24 MED ORDER — METOPROLOL SUCCINATE ER 50 MG PO TB24: 50.0000 mg | ORAL_TABLET | Freq: Every day | ORAL | Status: DC

## 2013-08-27 ENCOUNTER — Ambulatory Visit: Payer: Medicaid Other | Admitting: Internal Medicine

## 2013-08-28 ENCOUNTER — Telehealth (HOSPITAL_COMMUNITY): Payer: Self-pay | Admitting: *Deleted

## 2013-08-28 ENCOUNTER — Ambulatory Visit (INDEPENDENT_AMBULATORY_CARE_PROVIDER_SITE_OTHER): Payer: Medicaid Other | Admitting: Cardiology

## 2013-08-28 ENCOUNTER — Encounter: Payer: Self-pay | Admitting: Cardiology

## 2013-08-28 ENCOUNTER — Other Ambulatory Visit (HOSPITAL_COMMUNITY): Payer: Medicaid Other

## 2013-08-28 ENCOUNTER — Other Ambulatory Visit: Payer: Medicaid Other

## 2013-08-28 VITALS — BP 150/100 | HR 84 | Ht 69.0 in | Wt 283.0 lb

## 2013-08-28 DIAGNOSIS — I447 Left bundle-branch block, unspecified: Secondary | ICD-10-CM

## 2013-08-28 DIAGNOSIS — I428 Other cardiomyopathies: Secondary | ICD-10-CM

## 2013-08-28 DIAGNOSIS — I5042 Chronic combined systolic (congestive) and diastolic (congestive) heart failure: Secondary | ICD-10-CM

## 2013-08-28 DIAGNOSIS — Z79899 Other long term (current) drug therapy: Secondary | ICD-10-CM

## 2013-08-28 LAB — ICD DEVICE OBSERVATION
ATRIAL PACING ICD: 0.1 pct
LV LEAD IMPEDENCE ICD: 285 Ohm
RV LEAD IMPEDENCE ICD: 475 Ohm
VENTRICULAR PACING ICD: 98.5 pct

## 2013-08-28 MED ORDER — SPIRONOLACTONE 25 MG PO TABS: 25.0000 mg | ORAL_TABLET | Freq: Every day | ORAL | Status: DC

## 2013-08-28 NOTE — Telephone Encounter (Signed)
Spoke w/Pam, RN at [facility] she sch pt to see Dr Antoine Poche at 10:15 today, pt aware and states she will be there

## 2013-08-28 NOTE — Telephone Encounter (Signed)
Message copied by Noralee Space on Tue Aug 28, 2013  8:43 AM ------      Message from: Laurey Morale      Created: Tue Aug 28, 2013 12:05 AM       She needs followup in office tomorrow (Tuesday).  She feels "funny in her chest." Does not seem to describe defibrillator going off but she is concerned that is what happened.  Some dyspnea.  She just had CRT-D device put in.  I can't see her tomorrow (in cath lab) but she needs someone to see her.  Has seen Lawson Fiscal before.  Also would be reasonable to get her in to see EP.  She will need her ICD interrogated either way.  She was told to go to ER if symptoms worsen.  Please call her early.             She also should get a CHF clinic appointment on a day I am in CHF clinic.              Thanks.      Dalton ------

## 2013-08-28 NOTE — Progress Notes (Signed)
HPI The patient was added to my schedule post CRT-D placement. [name] had this done with chronic combined systolic and diastolic heart failure. I reviewed the hospital records. [name] post implant [name] was hypotensive. Echocardiography demonstrated trivial pericardial effusion. Her hypotension eventually resolved. Clonidine and spironolactone were discontinued. Metoprolol was reduced. Her afterload reduction with hydralazine and Imdur was continued. However, [name] has continued to feel poorly. [name] said over the weekend [name] felt "up and down". [name] didn't report fevers though [name] felt cold. [name] describes some hot flashes. [name] didn't have any diaphoresis or shaking chills. [name] feels anxious and restless. [name] has had some sensation of her chest and beating but I'm not sure that this indicates diaphragmatic stimulation. [name]'s not describing any tachycardia palpitations. [name]'s not having any PND or orthopnea. [name]'s not describing any increased lower extremity swelling. Her weight is up a little bit but these are different scales. [name] doesn't have a scale at home. Her blood pressure is compared with previous.  Allergies  Allergen Reactions  . Clindamycin Anaphylaxis and Swelling    Swelling of pain  . Enalapril Maleate Swelling  . Lisinopril Swelling    Current Outpatient Prescriptions  Medication Sig Dispense Refill  . albuterol (PROVENTIL HFA;VENTOLIN HFA) 108 (90 BASE) MCG/ACT inhaler Inhale 2 puffs into the lungs every 6 (six) hours as needed for wheezing or shortness of breath. For wheezing      . aspirin EC 81 MG tablet Take 81 mg by mouth every morning.      . Blood Glucose Monitoring Suppl (ACCU-CHEK NANO SMARTVIEW) W/DEVICE KIT 1 each by Does not apply route 3 (three) times daily with meals.  1 kit  0  . citalopram (CELEXA) 20 MG tablet Take 20 mg by mouth every morning.      . cyclobenzaprine (FLEXERIL) 10 MG tablet Take 10 mg by mouth 3 (three) times daily as needed for muscle spasms.      .  diclofenac sodium (VOLTAREN) 1 % GEL Apply 2 g topically 4 (four) times daily as needed (for pain).      [name] digoxin (LANOXIN) 0.25 MG tablet Take 0.25 mg by mouth daily.      . furosemide (LASIX) 40 MG tablet Take 40 mg by mouth 2 (two) times daily.      . hydrALAZINE (APRESOLINE) 25 MG tablet Take 25 mg by mouth 3 (three) times daily.      [name] ibuprofen (ADVIL,MOTRIN) 200 MG tablet Take 600 mg by mouth every 6 (six) hours as needed for pain.      [name] insulin aspart (NOVOLOG FLEXPEN) 100 UNIT/ML injection Inject 7 Units into the skin 3 (three) times daily before meals.  1 vial  1  . insulin glargine (LANTUS SOLOSTAR) 100 UNIT/ML injection Inject 0.2 mLs (20 Units total) into the skin at bedtime. DX: 250.02. Will need 3 pens/month  3 pen  1  . Insulin Pen Needle 31G X 5 MM MISC Use to inject insulin 4 times a day  120 each  0  . isosorbide mononitrate (IMDUR) 30 MG 24 hr tablet Take 30 mg by mouth daily.      . metoprolol succinate (TOPROL-XL) 50 MG 24 hr tablet Take 1 tablet (50 mg total) by mouth daily.  30 tablet  3  . Misc. Devices (ROLLATOR) MISC Use daily for ambulation  1 each  0  . oxyCODONE-acetaminophen (PERCOCET) 10-325 MG per tablet Take 1 tablet by mouth every 8 (eight) hours as needed for pain. For pain      .  pantoprazole (PROTONIX) 40 MG tablet Take 40 mg by mouth daily.      . simvastatin (ZOCOR) 40 MG tablet Take 40 mg by mouth daily at 6 PM.      . traMADol (ULTRAM) 50 MG tablet Take 1 tablet (50 mg total) by mouth every 6 (six) hours as needed for pain.  20 tablet  0   No current facility-administered medications for this visit.    Past Medical History  Diagnosis Date  . Hypertension     Poorly controlled. Has had HTN since age 8. Angioedema with ACEI.  24 Hr urine and renal arterial dopplers ordered . . . Never done  . Type II or unspecified type diabetes mellitus without mention of complication, uncontrolled DX: 2002  . NICM (nonischemic cardiomyopathy)     EF 45-50% in  8/12, cath 6/12 showed normal coronaries, EF 50-55% by LV gram  . Morbid obesity   . Allergic rhinitis   . HLD (hyperlipidemia)   . OSA on CPAP     sleep study in 8/12 showed moderate to severe OSA requiring CPAP  . Chronic lower back pain     secondary to DJD, obsetiy, hip problems. Followed by Dr. Ivory Broad (pain management)  . 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 Chronic diastolic heart failure      Primarily diastolic CHF: Likely due to uncontrolled HTN. Last echo (8/12) with EF 45-50%, mild to moderate LVH with some asymmetric septal hypertrophy, RV normal size and systolic function. EF 50-55% by LV-gram in 6/12.   . Degeneration of lumbar or lumbosacral intervertebral disc   . Polyneuropathy in diabetes(357.2)   . Thoracic or lumbosacral neuritis or radiculitis, unspecified   . Calcifying tendinitis of shoulder   . LBBB (left bundle branch block)   . Chronic combined systolic and diastolic CHF (congestive heart failure)     EF 40-45% by echo 12/06/2012  . Coronary artery disease     questionable. LHC 05/2011 showing normal coronaries // Followed at [facility], Dr. Shirlee Latch  . GERD (gastroesophageal reflux disease)     Past Surgical History  Procedure Laterality Date  . Breast biopsies Bilateral 2011    patient reports benign results  . Tubal ligation  05/31/1985  . Hand surgery Left   . Shoulder surgery Right   . Dental surgery      tumors removed   . Cardiac catheterization  05/2011    Family History  Problem Relation Age of Onset  . Heart disease Mother 87    Died of MI at age 24 yo  . Heart disease Paternal Grandmother     requiring pacemaker.  [name] Heart disease Paternal Grandfather 93    Died of MI at possibly age 52-53yo  . Stroke Paternal Grandfather   . Heart disease Father 77    MI age 64yo requiring stenting  . Kidney disease Mother     requiring dialysis  . Congestive Heart Failure Mother   . Diabetes Father   . Diabetes  Brother   . Heart disease Brother 23    MI at age 5 years old  . Breast cancer Paternal Aunt   . Breast cancer Maternal Grandmother     History   Social History  . Marital Status: Single    Spouse Name: N/A    Number of Children: 2  . Years of Education: 9th grade   Occupational History  . Unemployed     planning  on getting disability   Social History Main Topics  . Smoking status: Never Smoker   . Smokeless tobacco: Never Used  . Alcohol Use: No  . Drug Use: No  . Sexual Activity: Yes    Birth Control/ Protection: Surgical   Other Topics Concern  . Not on file   Social History Narrative   Lives in [place] with her son. Is able to read and write fluently in [place].    ROS:  As stated in the HPI and negative for all other systems.   PHYSICAL EXAM BP 150/100  Pulse 84  Ht 5\' 9"  (1.753 m)  Wt 283 lb (128.368 kg)  BMI 41.77 kg/m2  LMP 07/23/2013 GENERAL:  Well appearing HEENT:  Pupils equal round and reactive, fundi not visualized, oral mucosa unremarkable NECK:  No jugular venous distention, waveform within normal limits, carotid upstroke brisk and symmetric, no bruits, no thyromegaly LYMPHATICS:  No cervical, inguinal adenopathy LUNGS:  Clear to auscultation bilaterally BACK:  ICD wound intact without erythema, hematoma or drainage. Slight ecchymosis. HEART:  PMI not displaced or sustained,S1 and S2 within normal limits, no S3, no S4, no clicks, no rubs, no murmurs ABD:  Flat, positive bowel sounds normal in frequency in pitch, no bruits, no rebound, no guarding, no midline pulsatile mass, no hepatomegaly, no splenomegaly EXT:  2 plus pulses throughout, no edema, no cyanosis no clubbing SKIN:  No rashes no nodules NEURO:  Cranial nerves II through XII grossly intact, motor grossly intact throughout PSYCH:  Cognitively intact, oriented to person place and time   EKG:  Sinus rhythm with ventricular pacing 100% capture. Rate 84  08/28/2013  ASSESSMENT AND  PLAN  POST CRT - D:  The devise was interrogated today and is functioning normally.  However, [name] seemed to be quite sensitive to LV pacing. [name] had her parameters change slightly today. [name] was noted to have one brief run of atrial fibrillation. No other change in therapy is indicated.  I doubt that there is any infection. I don't have any physical signs suggesting effusion but we will check a limited echo to evaluate this. If this is normal probably no further urgent evaluation would be indicated unless [name] has a change in her symptoms.  HTN:  Her blood pressure is back up. I will restart spironolactone 25 mg daily and check a basic metabolic profile in one week.  CHF:  I think [name] is euvolemic. We discussed the need to get his scale. I will be changing the medication as above. Otherwise not suggesting a change in therapy.    (Greater than 40 minutes reviewing all data with greater than 50% face to face with the patient).

## 2013-08-28 NOTE — Patient Instructions (Addendum)
Please restart Spironolactone 25 mg a day. Continue all other medications as listed.  Please have blood work in 1 week (BMP)  Your physician has requested that you have an echocardiogram. Echocardiography is a painless test that uses sound waves to create images of your heart. It provides your doctor with information about the size and shape of your heart and how well your heart's chambers and valves are working. This procedure takes approximately one hour. There are no restrictions for this procedure.  Follow up with Dr Shirlee Latch as scheduled.

## 2013-08-31 ENCOUNTER — Encounter: Payer: Self-pay | Admitting: Cardiology

## 2013-08-31 ENCOUNTER — Ambulatory Visit (INDEPENDENT_AMBULATORY_CARE_PROVIDER_SITE_OTHER): Payer: Medicaid Other | Admitting: Cardiology

## 2013-08-31 VITALS — BP 147/97 | HR 85 | Ht 69.0 in | Wt 277.8 lb

## 2013-08-31 DIAGNOSIS — I10 Essential (primary) hypertension: Secondary | ICD-10-CM

## 2013-08-31 DIAGNOSIS — I428 Other cardiomyopathies: Secondary | ICD-10-CM

## 2013-08-31 DIAGNOSIS — I5022 Chronic systolic (congestive) heart failure: Secondary | ICD-10-CM

## 2013-08-31 DIAGNOSIS — Z950 Presence of cardiac pacemaker: Secondary | ICD-10-CM

## 2013-08-31 NOTE — Progress Notes (Signed)
ELECTROPHYSIOLOGY OFFICE NOTE  Patient ID: [name] MRN: [id number], DOB/AGE: 1966/03/13   Date of Visit: 08/31/2013  Primary Physician: Lars Masson, MD Primary Cardiologist: Shirlee Latch, MD / Graciela Husbands, MD Reason for Visit: Follow-up for wound check, HF and BP   History of Present Illness  [name] is a 47 y.o. female with a NICM, EF 20%, chronic combined systolic and diastolic HF, LBBB, HTN, dyslipidemia, DM, OSA and obesity who has had persistent LV dysfunction and NYHA class III HF despite optimal medical therapy. She underwent BiV ICD implantation for CRT and primary prevention of SCD on 08/22/2013. She presents today for follow-up regarding her wound, HF and BP.   She was evaluated by Dr. Antoine Poche 3 days ago for palpitations and her device was interrogated which revealed normal BiV ICD function. Of note, she was symptomatic with V pacing. She presents today and states she is feeling better. She denies CP or SOB. She denies palpitiations, dizziness, near syncope or syncope. She denies LE swelling, orthopnea or PND. Her SBP was 165 so Dr. Antoine Poche added spironolactone back to her medication regimen 3 days ago. Today she reports she has not taken any of her medications so far. She reports compliance with her medications. She feels her implant site is healing well. She denies swelling, pain, bleeding, drainage or fever.  Past Medical History Past Medical History  Diagnosis Date  . Hypertension     Poorly controlled. Has had HTN since age 54. Angioedema with ACEI.  24 Hr urine and renal arterial dopplers ordered . . . Never done  . Type II or unspecified type diabetes mellitus without mention of complication, uncontrolled DX: 2002  . NICM (nonischemic cardiomyopathy)     EF 45-50% in 8/12, cath 6/12 showed normal coronaries, EF 50-55% by LV gram  . Morbid obesity   . Allergic rhinitis   . HLD (hyperlipidemia)   . OSA on CPAP     sleep study in 8/12 showed moderate to severe OSA  requiring CPAP  . Chronic lower back pain     secondary to DJD, obsetiy, hip problems. Followed by Dr. Ivory Broad (pain management)  . 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 Chronic diastolic heart failure      Primarily diastolic CHF: Likely due to uncontrolled HTN. Last echo (8/12) with EF 45-50%, mild to moderate LVH with some asymmetric septal hypertrophy, RV normal size and systolic function. EF 50-55% by LV-gram in 6/12.   . Degeneration of lumbar or lumbosacral intervertebral disc   . Polyneuropathy in diabetes(357.2)   . Thoracic or lumbosacral neuritis or radiculitis, unspecified   . Calcifying tendinitis of shoulder   . LBBB (left bundle branch block)   . Chronic combined systolic and diastolic CHF (congestive heart failure)     EF 40-45% by echo 12/06/2012  . Coronary artery disease     questionable. LHC 05/2011 showing normal coronaries // Followed at [facility], Dr. Shirlee Latch  . GERD (gastroesophageal reflux disease)     Past Surgical History Past Surgical History  Procedure Laterality Date  . Breast biopsies Bilateral 2011    patient reports benign results  . Tubal ligation  05/31/1985  . Hand surgery Left   . Shoulder surgery Right   . Dental surgery      tumors removed   . Cardiac catheterization  05/2011    Allergies/Intolerances Allergies  Allergen Reactions  . Clindamycin Anaphylaxis and Swelling    Swelling  of pain  . Enalapril Maleate Swelling  . Lisinopril Swelling    Current Home Medications Current Outpatient Prescriptions  Medication Sig Dispense Refill  . albuterol (PROVENTIL HFA;VENTOLIN HFA) 108 (90 BASE) MCG/ACT inhaler Inhale 2 puffs into the lungs every 6 (six) hours as needed for wheezing or shortness of breath. For wheezing      . aspirin EC 81 MG tablet Take 81 mg by mouth every morning.      . Blood Glucose Monitoring Suppl (ACCU-CHEK NANO SMARTVIEW) W/DEVICE KIT 1 each by Does not apply route 3 (three) times  daily with meals.  1 kit  0  . citalopram (CELEXA) 20 MG tablet Take 20 mg by mouth every morning.      . cyclobenzaprine (FLEXERIL) 10 MG tablet Take 10 mg by mouth 3 (three) times daily as needed for muscle spasms.      . diclofenac sodium (VOLTAREN) 1 % GEL Apply 2 g topically 4 (four) times daily as needed (for pain).      [name] digoxin (LANOXIN) 0.25 MG tablet Take 0.25 mg by mouth daily.      . furosemide (LASIX) 40 MG tablet Take 40 mg by mouth 2 (two) times daily.      . hydrALAZINE (APRESOLINE) 25 MG tablet Take 25 mg by mouth 3 (three) times daily.      [name] ibuprofen (ADVIL,MOTRIN) 200 MG tablet Take 600 mg by mouth every 6 (six) hours as needed for pain.      [name] insulin aspart (NOVOLOG FLEXPEN) 100 UNIT/ML injection Inject 7 Units into the skin 3 (three) times daily before meals.  1 vial  1  . insulin glargine (LANTUS SOLOSTAR) 100 UNIT/ML injection Inject 0.2 mLs (20 Units total) into the skin at bedtime. DX: 250.02. Will need 3 pens/month  3 pen  1  . Insulin Pen Needle 31G X 5 MM MISC Use to inject insulin 4 times a day  120 each  0  . isosorbide mononitrate (IMDUR) 30 MG 24 hr tablet Take 30 mg by mouth daily.      . metoprolol succinate (TOPROL-XL) 50 MG 24 hr tablet Take 1 tablet (50 mg total) by mouth daily.  30 tablet  3  . Misc. Devices (ROLLATOR) MISC Use daily for ambulation  1 each  0  . oxyCODONE-acetaminophen (PERCOCET) 10-325 MG per tablet Take 1 tablet by mouth every 8 (eight) hours as needed for pain. For pain      . pantoprazole (PROTONIX) 40 MG tablet Take 40 mg by mouth daily.      . simvastatin (ZOCOR) 40 MG tablet Take 40 mg by mouth daily at 6 PM.      . spironolactone (ALDACTONE) 25 MG tablet Take 1 tablet (25 mg total) by mouth daily.  90 tablet  3  . traMADol (ULTRAM) 50 MG tablet Take 1 tablet (50 mg total) by mouth every 6 (six) hours as needed for pain.  20 tablet  0   No current facility-administered medications for this visit.   Social History History    Social History  . Marital Status: Single    Spouse Name: N/A    Number of Children: 2  . Years of Education: 9th grade   Occupational History  . Unemployed     planning on getting disability   Social History Main Topics  . Smoking status: Never Smoker   . Smokeless tobacco: Never Used  . Alcohol Use: No  . Drug Use: No  . Sexual Activity:  Yes    Birth Control/ Protection: Surgical   Other Topics Concern  . Not on file   Social History Narrative   Lives in [place] with her son. Is able to read and write fluently in [place].     Review of Systems General: No chills, fever, night sweats or weight changes Cardiovascular: No chest pain, dyspnea on exertion, edema, orthopnea, palpitations, paroxysmal nocturnal dyspnea Dermatological: No rash, lesions or masses Respiratory: No cough, dyspnea Urologic: No hematuria, dysuria Abdominal: No nausea, vomiting, diarrhea, bright red blood per rectum, melena, or hematemesis Neurologic: No visual changes, weakness, changes in mental status All other systems reviewed and are otherwise negative except as noted above.  Physical Exam Vitals: Blood pressure 147/97, pulse 85, height 5\' 9"  (1.753 m), weight 277 lb 12.8 oz (126.009 kg).  General: Well developed, well appearing 47 y.o. female in no acute distress. HEENT: Normocephalic, atraumatic. EOMs intact. Sclera nonicteric. Oropharynx clear.  Neck: Supple without bruits. No JVD. Lungs: Respirations regular and unlabored, CTA bilaterally. No wheezes, rales or rhonchi. Heart: RRR. S1, S2 present. No murmurs, rub, S3 or S4. Abdomen: Soft, non-tender, non-distended. BS present x 4 quadrants. No hepatosplenomegaly.  Extremities: No clubbing, cyanosis or edema. DP/PT/Radials 2+ and equal bilaterally. Psych: Normal affect. Neuro: Alert and oriented X 3. Moves all extremities spontaneously.  Diagnostics Device interrogation - full interrogation done 08/28/2013 showing normal BiV ICD  function; quick look today reveals BiV pacing 98% of the time, no arrhythmias  Assessment and Plan 1. NICM with NYHA class III HF and LBBB, now s/p BiV ICD implantation for CRT and primary prevention SCD  2. Hypotension, resolved  1. Chronic combined systolic and diastolic HF 2. LBBB  3. HTN  Ms. Ciresi is doing well post CRT-D implant. Her recent device interrogation shows normal device function. BP elevated today but she has not taken her medications so far. No changes made to her medications today. Counseled her regarding the importance of medication compliance and follow-up. She will follow-up with Dr. Shirlee Latch as scheduled on 10/02/2013 and with Dr. Graciela Husbands in 3 months.  Signed, Rick Duff, PA-C 08/31/2013, 6:06 PM

## 2013-08-31 NOTE — Patient Instructions (Addendum)
KEEP APPOINTMENT WITH DR. Shirlee Latch AND KEEP APPOINTMENT WITH DR. Graciela Husbands  PLEASE OFFICE TO VERIFY MEDICATION LIST

## 2013-09-03 ENCOUNTER — Telehealth: Payer: Self-pay

## 2013-09-03 ENCOUNTER — Encounter: Payer: Medicaid Other | Attending: Physical Medicine & Rehabilitation | Admitting: Physical Medicine & Rehabilitation

## 2013-09-03 ENCOUNTER — Encounter: Payer: Self-pay | Admitting: Physical Medicine & Rehabilitation

## 2013-09-03 ENCOUNTER — Ambulatory Visit: Payer: Medicaid Other

## 2013-09-03 VITALS — BP 163/86 | HR 90 | Resp 14 | Ht 69.0 in | Wt 274.8 lb

## 2013-09-03 DIAGNOSIS — M1712 Unilateral primary osteoarthritis, left knee: Secondary | ICD-10-CM

## 2013-09-03 DIAGNOSIS — M47817 Spondylosis without myelopathy or radiculopathy, lumbosacral region: Secondary | ICD-10-CM

## 2013-09-03 DIAGNOSIS — E1149 Type 2 diabetes mellitus with other diabetic neurological complication: Secondary | ICD-10-CM

## 2013-09-03 DIAGNOSIS — M1710 Unilateral primary osteoarthritis, unspecified knee: Secondary | ICD-10-CM

## 2013-09-03 DIAGNOSIS — I5042 Chronic combined systolic (congestive) and diastolic (congestive) heart failure: Secondary | ICD-10-CM

## 2013-09-03 DIAGNOSIS — E1140 Type 2 diabetes mellitus with diabetic neuropathy, unspecified: Secondary | ICD-10-CM

## 2013-09-03 DIAGNOSIS — I509 Heart failure, unspecified: Secondary | ICD-10-CM

## 2013-09-03 DIAGNOSIS — M47816 Spondylosis without myelopathy or radiculopathy, lumbar region: Secondary | ICD-10-CM

## 2013-09-03 DIAGNOSIS — E1142 Type 2 diabetes mellitus with diabetic polyneuropathy: Secondary | ICD-10-CM

## 2013-09-03 MED ORDER — OXYCODONE-ACETAMINOPHEN 10-325 MG PO TABS: 1.0000 | ORAL_TABLET | Freq: Three times a day (TID) | ORAL | Status: DC | PRN

## 2013-09-03 NOTE — Patient Instructions (Addendum)
WORK ON YOUR EXERCISE AND STRETCHING!!!!   GET YOUR SUGARS UNDER BETTER CONTROL!!!!   CALL ME WITH ANY PROBLEMS OR QUESTIONS (#[phone]).  HAVE A GOOD DAY

## 2013-09-03 NOTE — Progress Notes (Signed)
Subjective:    Patient ID: [name], female    DOB: 1966-11-30, 47 y.o.   MRN: [id number]  HPI  [name] is back regarding her chronic pain. She had a pacer/defibrillator placed a couple weeks ago. She is still wearing a sling on the left to protect the surgical site.   From a pain standpoint, she has noticed more "stiffness" and weakness in her legs. She has felt that it's been a little more difficult to get up from her chair.  She doesn't feel that it's related to lack of activity. Her sugars have been high however.   Overall her energy levels have improved. She feels that the pacer has given her extra "pep."  She had great results with the TPI's I did a couple months ago.  She received a scholarship from the [facility] but needs a doctor's note to begin going there.  Pain Inventory Average Pain 6 Pain Right Now 8 My pain is burning  In the last 24 hours, has pain interfered with the following? General activity 6 Relation with others 6 Enjoyment of life 6 What TIME of day is your pain at its worst? evening Sleep (in general) Fair  Pain is worse with: walking, bending, sitting, standing and some activites Pain improves with: medication Relief from Meds: 8  Mobility walk without assistance ability to climb steps?  yes do you drive?  no  Function employed # of hrs/week . I need assistance with the following:  meal prep, household duties and shopping  Neuro/Psych weakness numbness tingling trouble walking dizziness depression anxiety loss of taste or smell  Prior Studies Any changes since last visit?  yes  Pacemaker/defibrillator  Physicians involved in your care Any changes since last visit?  no   Family History  Problem Relation Age of Onset  . Heart disease Mother 36    Died of MI at age 30 yo  . Heart disease Paternal Grandmother     requiring pacemaker.  [name] Heart disease Paternal Grandfather 40    Died of MI at possibly age 93-53yo  . Stroke  Paternal Grandfather   . Heart disease Father 30    MI age 20yo requiring stenting  . Kidney disease Mother     requiring dialysis  . Congestive Heart Failure Mother   . Diabetes Father   . Diabetes Brother   . Heart disease Brother 76    MI at age 86 years old  . Breast cancer Paternal Aunt   . Breast cancer Maternal Grandmother    History   Social History  . Marital Status: Single    Spouse Name: N/A    Number of Children: 2  . Years of Education: 9th grade   Occupational History  . Unemployed     planning on getting disability   Social History Main Topics  . Smoking status: Never Smoker   . Smokeless tobacco: Never Used  . Alcohol Use: No  . Drug Use: No  . Sexual Activity: Yes    Birth Control/ Protection: Surgical   Other Topics Concern  . None   Social History Narrative   Lives in [place] with her son. Is able to read and write fluently in [place].   Past Surgical History  Procedure Laterality Date  . Breast biopsies Bilateral 2011    patient reports benign results  . Tubal ligation  05/31/1985  . Hand surgery Left   . Shoulder surgery Right   . Dental surgery  tumors removed   . Cardiac catheterization  05/2011  . Pacemaker/defibrillator placed  08/22/13 Left 08/22/13   Past Medical History  Diagnosis Date  . Hypertension     Poorly controlled. Has had HTN since age 74. Angioedema with ACEI.  24 Hr urine and renal arterial dopplers ordered . . . Never done  . Type II or unspecified type diabetes mellitus without mention of complication, uncontrolled DX: 2002  . NICM (nonischemic cardiomyopathy)     EF 45-50% in 8/12, cath 6/12 showed normal coronaries, EF 50-55% by LV gram  . Morbid obesity   . Allergic rhinitis   . HLD (hyperlipidemia)   . OSA on CPAP     sleep study in 8/12 showed moderate to severe OSA requiring CPAP  . Chronic lower back pain     secondary to DJD, obsetiy, hip problems. Followed by Dr. Ivory Broad (pain management)  .  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 Chronic diastolic heart failure      Primarily diastolic CHF: Likely due to uncontrolled HTN. Last echo (8/12) with EF 45-50%, mild to moderate LVH with some asymmetric septal hypertrophy, RV normal size and systolic function. EF 50-55% by LV-gram in 6/12.   . Degeneration of lumbar or lumbosacral intervertebral disc   . Polyneuropathy in diabetes(357.2)   . Thoracic or lumbosacral neuritis or radiculitis, unspecified   . Calcifying tendinitis of shoulder   . LBBB (left bundle branch block)   . Chronic combined systolic and diastolic CHF (congestive heart failure)     EF 40-45% by echo 12/06/2012  . Coronary artery disease     questionable. LHC 05/2011 showing normal coronaries // Followed at [facility], Dr. Shirlee Latch  . GERD (gastroesophageal reflux disease)    BP 163/86  Pulse 90  Resp 14  Ht 5\' 9"  (1.753 m)  Wt 274 lb 12.8 oz (124.648 kg)  BMI 40.56 kg/m2  SpO2 97%    Review of Systems  Musculoskeletal: Positive for gait problem.       Spasms  Neurological: Positive for dizziness and numbness.       Tingling  Psychiatric/Behavioral: Positive for dysphoric mood. The patient is nervous/anxious.   All other systems reviewed and are negative.       Objective:   Physical Exam Constitutional: She is oriented to person, place, and time. She appears well-developed and well-nourished.  obese  HENT:  Head: Normocephalic and atraumatic.  Nose: Nose normal.  Eyes: Conjunctivae and EOM are normal. Pupils are equal, round, and reactive to light.  Neck: Normal range of motion.  Cardiovascular: Normal rate and regular rhythm. Defib/pacer left upper chest wall, site clean Pulmonary/Chest: Effort normal and breath sounds normal.  Abdominal: She exhibits no distension. There is no tenderness.  Musculoskeletal:   Patient had pain with cervical movements particularly rotation and bending to the left. Left shoulder is  painful with RTC maneuvers, apprehension test is negative. Pacer site  A little tender. Lumbar back: She exhibits decreased range of motion, tenderness, bony tenderness, pain and spasm  She was able to flex to 45 degrees and extended 15 degrees. Facet maneuvers caused some discomfort today. Pain seem more pronounced on the right.  Trace edema both lower ext.  Mild joint line tenderness  bilaterally. She has bilateral valgus deformities at each knee, right more than left.  Both calves are tender with stretching and slightly with palpation.    Neurological: She is alert and oriented to  person, place, and time. She has normal strength and normal reflexes. A sensory deficit (stocking glove both legs) is present. She displays a negative Romberg sign.  Psychiatric: She has a normal mood and affect. Her behavior is normal. Judgment and thought content normal.   Assessment & Plan:   ASSESSMENT:  1. Chronic low back pain which is multifactorial. Recent MRI of the  back is notable for disc disease and facet hypertrophy. Most  involved level appeared to be L4-L5, where there is a shallow broad-  based disk herniation and facet arthropathy with mild stenosis.  2. Diabetic peripheral neuropathy.  3. Bilateral rotator cuff syndrome.  4. Morbid obesity.  5. Cardiomyopathy/HTN. It is quite obvious that she's feeling better given her weight loss and pacer.  6. 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  7. Cervical myofascial pain, left -sided predominantly--improved after TPI's  PLAN:  1. Continue on tegretol for neuropathic pain.  2. Percocet 10/325 was refilled one q8 prn. #70.  3. Discussed increasing aerobic exercise. I would like for her to get in the pool once she's cleared from a cardiac standpoint as well. I wrote her an rx for the [facility] today.  4. She still needs to work on better glycemic control. We discussed again today. 5. Cardiac mgt per Dr. Jearld Pies, et  al.  6. Continue Voltaren gel for left knee.  7. I will see her back in 3 months' time. She will call me with any  problems or questions. 30 minutes of face to face patient care time were spent during this visit. All questions were encouraged and answered. Still can consider injection based therapies but I'm not overly optimistic that these will provide substantial relief given the multiple issues involved with her pain.

## 2013-09-03 NOTE — Telephone Encounter (Signed)
Patient called requesting percocet refill.  She has recently been in the hospital so she was added to schedule today.

## 2013-09-05 ENCOUNTER — Other Ambulatory Visit (INDEPENDENT_AMBULATORY_CARE_PROVIDER_SITE_OTHER): Payer: Medicaid Other

## 2013-09-05 ENCOUNTER — Ambulatory Visit (HOSPITAL_BASED_OUTPATIENT_CLINIC_OR_DEPARTMENT_OTHER): Payer: Medicaid Other | Admitting: Radiology

## 2013-09-05 DIAGNOSIS — Z79899 Other long term (current) drug therapy: Secondary | ICD-10-CM

## 2013-09-05 DIAGNOSIS — I319 Disease of pericardium, unspecified: Secondary | ICD-10-CM

## 2013-09-05 DIAGNOSIS — I428 Other cardiomyopathies: Secondary | ICD-10-CM

## 2013-09-05 DIAGNOSIS — I5042 Chronic combined systolic (congestive) and diastolic (congestive) heart failure: Secondary | ICD-10-CM

## 2013-09-05 DIAGNOSIS — I313 Pericardial effusion (noninflammatory): Secondary | ICD-10-CM

## 2013-09-05 NOTE — Progress Notes (Signed)
Limited Echocardiogram performed.  

## 2013-09-06 ENCOUNTER — Other Ambulatory Visit: Payer: Self-pay | Admitting: *Deleted

## 2013-09-06 ENCOUNTER — Ambulatory Visit (HOSPITAL_COMMUNITY)
Admission: RE | Admit: 2013-09-06 | Discharge: 2013-09-06 | Disposition: A | Discharge status: Home / Self Care | Payer: Medicaid Other | Source: Ambulatory Visit | Attending: Internal Medicine | Admitting: Internal Medicine

## 2013-09-06 DIAGNOSIS — I517 Cardiomegaly: Secondary | ICD-10-CM | POA: Insufficient documentation

## 2013-09-06 DIAGNOSIS — I428 Other cardiomyopathies: Secondary | ICD-10-CM | POA: Insufficient documentation

## 2013-09-06 DIAGNOSIS — I10 Essential (primary) hypertension: Secondary | ICD-10-CM | POA: Insufficient documentation

## 2013-09-06 DIAGNOSIS — E049 Nontoxic goiter, unspecified: Secondary | ICD-10-CM | POA: Insufficient documentation

## 2013-09-06 DIAGNOSIS — E119 Type 2 diabetes mellitus without complications: Secondary | ICD-10-CM | POA: Insufficient documentation

## 2013-09-06 LAB — BASIC METABOLIC PANEL
CO2: 28 mEq/L (ref 19–32)
GFR: 92.19 mL/min (ref >60.00)
Glucose, Bld: 141 mg/dL — ABNORMAL HIGH (ref 70–99)
Potassium: 3.9 mEq/L (ref 3.5–5.1)
Sodium: 137 mEq/L (ref 135–145)

## 2013-09-06 MED ORDER — INSULIN PEN NEEDLE 31G X 5 MM MISC: Status: DC

## 2013-09-10 ENCOUNTER — Telehealth: Payer: Self-pay | Admitting: *Deleted

## 2013-09-10 NOTE — Telephone Encounter (Signed)
Fax from [facility] - requesting refill on Polyethylene Glycol 3350 Oral Packet - mix 1 pack in liquid and drink daily as needed for constipation  Qty 14 Last dispensed 08/17/13. Not on current med list; stopped 08/14/13.  Thanks

## 2013-09-12 ENCOUNTER — Telehealth: Payer: Self-pay | Admitting: Nurse Practitioner

## 2013-09-12 NOTE — Telephone Encounter (Signed)
Message copied by Levi Aland on Wed Sep 12, 2013  3:58 PM ------      Message from: Rollene Rotunda      Created: Wed Sep 12, 2013  1:22 PM       EF is lower than previous.  Please schedule follow up with me to discuss further.  Call [name] with the results and send results to Lars Masson, MD       ------

## 2013-09-12 NOTE — Telephone Encounter (Signed)
Spoke with patient to report ECHO results.  Patient very tearful, states she needs help handling all that is going on with her right now.  Patient states she is doing the best she can.  We talked about diet and patient states she eats what she can; that she is overwhelmed with all that is going on and she thought Dr. Gala Romney was going to arrange for home health.  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  I called HF clinic and spoke with Tonye Becket, NP who states she will check to see if patient has home health orders and if she does not, she will initiate.  Amy stated that she would contact the patient tomorrow.  I advised patient of conversation with Tonye Becket, NP and that she will contact her regarding home health.  I advised patient to call back if she does not hear from home health and/or if symptoms worsen.  Patient verbalized understanding and agreement with plan.   **NOTE:  Patient has upcoming appointments with HF Clinic 10/7, Dr. Shirlee Latch 10/21 and Dr. Antoine Poche 10/23

## 2013-09-13 ENCOUNTER — Ambulatory Visit (INDEPENDENT_AMBULATORY_CARE_PROVIDER_SITE_OTHER): Payer: Medicaid Other | Admitting: Dietician

## 2013-09-13 ENCOUNTER — Ambulatory Visit (INDEPENDENT_AMBULATORY_CARE_PROVIDER_SITE_OTHER): Payer: Medicaid Other | Admitting: Internal Medicine

## 2013-09-13 ENCOUNTER — Encounter: Payer: Self-pay | Admitting: Internal Medicine

## 2013-09-13 VITALS — BP 132/86 | HR 86 | Temp 97.8°F | Wt 284.5 lb

## 2013-09-13 DIAGNOSIS — E1140 Type 2 diabetes mellitus with diabetic neuropathy, unspecified: Secondary | ICD-10-CM

## 2013-09-13 DIAGNOSIS — Z0000 Encounter for general adult medical examination without abnormal findings: Secondary | ICD-10-CM | POA: Insufficient documentation

## 2013-09-13 DIAGNOSIS — E1149 Type 2 diabetes mellitus with other diabetic neurological complication: Secondary | ICD-10-CM

## 2013-09-13 DIAGNOSIS — E1142 Type 2 diabetes mellitus with diabetic polyneuropathy: Secondary | ICD-10-CM

## 2013-09-13 DIAGNOSIS — I10 Essential (primary) hypertension: Secondary | ICD-10-CM

## 2013-09-13 DIAGNOSIS — E049 Nontoxic goiter, unspecified: Secondary | ICD-10-CM

## 2013-09-13 LAB — HM DIABETES EYE EXAM

## 2013-09-13 MED ORDER — INSULIN GLARGINE 100 UNIT/ML ~~LOC~~ SOLN: 16.0000 [IU] | Freq: Every day | SUBCUTANEOUS | Status: DC

## 2013-09-13 MED ORDER — INSULIN ASPART 100 UNIT/ML ~~LOC~~ SOLN: 5.0000 [IU] | Freq: Three times a day (TID) | SUBCUTANEOUS | Status: DC

## 2013-09-13 MED ORDER — GLUCOSE BLOOD VI STRP: ORAL_STRIP | Status: DC

## 2013-09-13 NOTE — Progress Notes (Signed)
Diabetes Self-Management Education  Visit Number: Follow-up 1  09/13/2013 [name], identified by name and date of birth, is a 47 y.o. female with Diabetes Type 2.       ASSESSMENT  Patient Concerns:  Problem Solving;Glycemic Control;Weight Control;Support  Note 10# weight gain - patient reports this is likely fluid  Lab Results: LDL Cholesterol  Date Value Range Status  12/07/2012 102* 0 - 99 mg/dL Final                Hemoglobin A1C  Date Value Range Status  07/24/2013 9.2   Final  12/07/2012 11.4* <5.7 % Final     (NOTE)                                                                               Family History  Problem Relation Age of Onset  . Heart disease Mother 62    Died of MI at age 42 yo  . Heart disease Paternal Grandmother     requiring pacemaker.  [name] Heart disease Paternal Grandfather 33    Died of MI at possibly age 22-53yo  . Stroke Paternal Grandfather   . Heart disease Father 6    MI age 91yo requiring stenting  . Kidney disease Mother     requiring dialysis  . Congestive Heart Failure Mother   . Diabetes Father   . Diabetes Brother   . Heart disease Brother 50    MI at age 46 years old  . Breast cancer Paternal Aunt   . Breast cancer Maternal Grandmother    History  Substance Use Topics  . Smoking status: Never Smoker   . Smokeless tobacco: Never Used  . Alcohol Use: No    Support Systems:   family at time Special Needs:  Simplified materials - sometimes needs assistance in reading after visit summary  Prior DM Education:  her and through multiple family members who have had diabetes Daily Foot Exams:  no Patient Belief / Attitude about Diabetes:  Diabetes can be controlled  Assessment comments: patient upset and exhausted today, from stress and not sleeping well due to stress. Would like to eat more nutritious and lower salt, but limited to what others give her because she has no income and unable to get food stamps. Referral  made to social worker per her request. Low sodium food from our pantry and list of local pantries provided today. Patient reports that previous eye exam at [facility] showed diabetes was affecting her eyes and she was asked to go to [facility], but was unable to afford the fees they requested to be seen there.    Diet Recall: not done today due to patient emotional state    Individualized Plan for Diabetes Self-Management Training:  Patient individualized diabetes plan discussed today with patient and includes: what is Diabetes, Nutrition, medications, monitoring, physical activity, how to handle highs and lows, Special care for my body when I have diabetes, Dealing daily with diabetes   Education Topics Reviewed with Patient Today:  Topic Points Discussed  Disease State    Nutrition Management    Physical Activity and Exercise    Medications    Monitoring  Acute Complications    Chronic Complications    Psychosocial Adjustment Role of stress on diabetes;Helped patient identify a support system for diabetes management  Goal Setting    Preconception Care (if applicable)      PATIENTS GOALS   Learning Objective(s):     Goal The patient agrees to:  Nutrition    Physical Activity    Medications    Monitoring test my blood glucose as discussed (note 1x per day with comment)  Problem Solving    Reducing Risk    Health Coping     Patient Self-Evaluation of Goals (Subsequent Visits)  Goal The patient rates self as meeting goals (% of time)  Nutrition    Physical Activity    Medications >75%  Monitoring < 25%  Problem Solving    Reducing Risk    Health Coping       PERSONALIZED PLAN / SUPPORT  Self-Management Support:     Discussed again with patient. Information about local support group provided- [facility], she also reports some family and friends are supportive ______________________________________________________________________  Outcomes  Expected  Outcomes:    expect improvement in glycemic control when patient able to meet financial and basic needs for  housing, food, etc.  Self-Care Barriers:   finances, emotional state and other comorbidities Education material provided: yes If problems or questions, patient to contact team via:  Phone  Time in: 1100     Time out: 1130  Future DSME appointment: -     [name], [name] 09/13/2013 12:03 PM

## 2013-09-13 NOTE — Assessment & Plan Note (Signed)
The patient has history of thyroid nodule. Workup has included a normal TSH, and a thyroid ultrasound showing a left-sided cystic lesion. We will followup with serial examinations, and monitoring for the development of symptoms of hyper/hypothyroidism.

## 2013-09-13 NOTE — Patient Instructions (Addendum)
General Instructions: For your diabetes, we are concerned about your episodes of low blood sugar.  We are making the following changes: -Decrease Lantus to 16 units daily -Decrease Novolog to 5 units three times per day -record your blood sugars as often as possible, up to three times per day, and bring your glucometer to your next visit  We strongly recommend the flu and pneumonia vaccines.  Please consider getting these vaccines at your next visit.  You are due for your yearly eye exam.  We can provide this service today.  Please return in 2-4 weeks, with all of your medications, so that we can go through them with you.   Treatment Goals:  Goals (1 Years of Data) as of 09/13/13   None      Progress Toward Treatment Goals:  Treatment Goal 09/13/2013  Hemoglobin A1C unable to assess  Blood pressure at goal    Self Care Goals & Plans:  Self Care Goal 08/20/2013  Manage my medications take my medicines as prescribed; refill my medications on time; bring my medications to every visit  Monitor my health keep track of my blood glucose  Eat healthy foods eat foods that are low in salt; eat baked foods instead of fried foods    Home Blood Glucose Monitoring 09/13/2013  Check my blood sugar once a day  When to check my blood sugar before meals     Care Management & Community Referrals:  Referral 09/13/2013  Referrals made for care management support none needed

## 2013-09-13 NOTE — Assessment & Plan Note (Signed)
Lab Results  Component Value Date   HGBA1C 9.2 07/24/2013   HGBA1C 9.7 04/09/2013   HGBA1C 11.4* 12/07/2012     Assessment: Diabetes control: poor control (HgbA1C >9%) Progress toward A1C goal:  unable to assess Comments: The patient's A1c of 9.2 represents poor glucose control. However, the patient reports episodes of symptomatic hypoglycemia (not able to be corroborated by glucometer) around 2 AM and at lunch time. Given this hypoglycemia, I believe that we have to lower the patient's overall insulin usage at this time. The patient notes difficulty obtaining glucometer strips, so I called her pharmacy today, and sent in a new prescription for the strips. The patient was instructed to come back in 2-4 weeks, at which time we can hopefully use her glucometer data to appropriately increase her insulin.  Plan: Medications:  Decrease Lantus from 20 to 16 units daily.  Decrease Novolog from 5 units TID to 7 units TID. Home glucose monitoring: Frequency: once a day Timing: before meals Instruction/counseling given: reminded to bring blood glucose meter & log to each visit Educational resources provided:   Self management tools provided:   Other plans: Checking urine microalbumin today.  Patient to undergo retinal scanner today.  Patient refused flu and pneumovax.

## 2013-09-13 NOTE — Assessment & Plan Note (Addendum)
-  The patient declined flu and pneumonia vaccines, stating beliefs of harm. After a discussion of the benefits of these vaccines, the patient agrees to "think about it", and reassess at next visit -The patient expresses a strong interest in having an office visit for medication reconciliation. I encouraged the patient to schedule a followup visit in 2-4 weeks, and bring all her medications, at which time we can go through them all with her

## 2013-09-13 NOTE — Progress Notes (Signed)
HPI The patient is a 47 y.o. female with a history of diabetes, hypertension, CHF status post recent dual-chamber defibrillator placement, OSA, presenting for a four-week followup.  The patient recently had a dual chamber defibrillator placed, and continues to follow closely with cardiology. The patient notes significant stress surrounding her heart condition, and becomes tearful during our examination.  A thyroid nodule was recently found.  TSH was checked and found to be normal.  The patient was sent for thyroid ultrasound, which showed a cystic lesion.  She notes no consistent symptoms of hyper or hypothyroidism, noting occasional both heat and cold intolerance, occasional diarrhea and constipation, and low appetite though she attributes this to her increased stress.  The patient has a history of poorly controlled diabetes, with her last A1c = 9.2. The patient notes that her blood sugars have mostly been in the 200's, though with occasional highs in the 400's.  She does not bring her glucometer today, noting that she has not received test strips [facility].  The patient is currently taking Lantus 20, and Novolog 7 TID with meals, missing insulin doses about 2-3 days/week.  The patient notes occasional episodes of hypoglycemic symptoms, typically around 2am, and around lunchtime, described as feeling "not quite right".  The patient requests for [place] to sit down with her and make sure she is on "the right medications".  However, she did not bring any of her medications with her today.  When asked for names of her medications, she becomes tearful, and says I don't know.  The patient declines flu and pneumonia vaccines today, stating that people have told her to vaccines "will make you sick".  ROS: General: no fevers, chills, changes in weight, changes in appetite Skin: no rash HEENT: no blurry vision, hearing changes, sore throat Pulm: no dyspnea, coughing, wheezing CV: see  HPI Abd: no abdominal pain, nausea/vomiting, diarrhea/constipation GU: no dysuria, hematuria, polyuria Ext: no arthralgias, myalgias Neuro: no weakness, numbness, or tingling  Filed Vitals:   09/13/13 1015  BP: 145/97  Pulse: 87  Temp: 97.8 F (36.6 C)    PEX General: alert, cooperative, appears distressed HEENT: pupils equal round and reactive to light, vision grossly intact, oropharynx clear and non-erythematous  Neck: supple, no lymphadenopathy Lungs: clear to ascultation bilaterally, normal work of respiration, no wheezes, rales, ronchi Heart: regular rate and rhythm, no murmurs, gallops, or rubs Abdomen: soft, non-tender, non-distended, normal bowel sounds Extremities: no cyanosis, clubbing, or edema Neurologic: alert & oriented X3, cranial nerves II-XII intact, strength grossly intact, sensation intact to light touch  Current Outpatient Prescriptions on File Prior to Visit  Medication Sig Dispense Refill  . albuterol (PROVENTIL HFA;VENTOLIN HFA) 108 (90 BASE) MCG/ACT inhaler Inhale 2 puffs into the lungs every 6 (six) hours as needed for wheezing or shortness of breath. For wheezing      . aspirin EC 81 MG tablet Take 81 mg by mouth every morning.      . Blood Glucose Monitoring Suppl (ACCU-CHEK NANO SMARTVIEW) W/DEVICE KIT 1 each by Does not apply route 3 (three) times daily with meals.  1 kit  0  . citalopram (CELEXA) 20 MG tablet Take 20 mg by mouth every morning.      . cyclobenzaprine (FLEXERIL) 10 MG tablet Take 10 mg by mouth 3 (three) times daily as needed for muscle spasms.      . diclofenac sodium (VOLTAREN) 1 % GEL Apply 2 g topically 4 (four) times daily as needed (for  pain).      . digoxin (LANOXIN) 0.25 MG tablet Take 0.25 mg by mouth daily.      . furosemide (LASIX) 40 MG tablet Take 40 mg by mouth 2 (two) times daily.      . hydrALAZINE (APRESOLINE) 25 MG tablet Take 25 mg by mouth 3 (three) times daily.      [name] ibuprofen (ADVIL,MOTRIN) 200 MG tablet Take 600  mg by mouth every 6 (six) hours as needed for pain.      [name] insulin aspart (NOVOLOG FLEXPEN) 100 UNIT/ML injection Inject 7 Units into the skin 3 (three) times daily before meals.  1 vial  1  . insulin glargine (LANTUS SOLOSTAR) 100 UNIT/ML injection Inject 0.2 mLs (20 Units total) into the skin at bedtime. DX: 250.02. Will need 3 pens/month  3 pen  1  . Insulin Pen Needle 31G X 5 MM MISC Use to inject insulin 4 times a day. Dx code: 83.62.Insulin dependent.  120 each  11  . isosorbide mononitrate (IMDUR) 30 MG 24 hr tablet Take 30 mg by mouth daily.      . metoprolol succinate (TOPROL-XL) 50 MG 24 hr tablet Take 1 tablet (50 mg total) by mouth daily.  30 tablet  3  . Misc. Devices (ROLLATOR) MISC Use daily for ambulation  1 each  0  . oxyCODONE-acetaminophen (PERCOCET) 10-325 MG per tablet Take 1 tablet by mouth every 8 (eight) hours as needed for pain. For pain  70 tablet  0  . pantoprazole (PROTONIX) 40 MG tablet Take 40 mg by mouth daily.      . simvastatin (ZOCOR) 40 MG tablet Take 40 mg by mouth daily at 6 PM.      . spironolactone (ALDACTONE) 25 MG tablet Take 1 tablet (25 mg total) by mouth daily.  90 tablet  3  . traMADol (ULTRAM) 50 MG tablet Take 1 tablet (50 mg total) by mouth every 6 (six) hours as needed for pain.  20 tablet  0   No current facility-administered medications on file prior to visit.    Assessment/Plan

## 2013-09-13 NOTE — Assessment & Plan Note (Signed)
BP Readings from Last 3 Encounters:  09/13/13 132/86  09/03/13 163/86  08/31/13 147/97    Lab Results  Component Value Date   NA 137 09/05/2013   K 3.9 09/05/2013   CREATININE 0.9 09/05/2013    Assessment: Blood pressure control: controlled Progress toward BP goal:  at goal Comments: The patient's blood pressure was mildly elevated on initial reading, but recheck showed a value of 132/86. I will avoid changing her blood pressure medications today, but will recheck at her followup visit in 2-4 weeks.  Plan: Medications:  continue current medications Educational resources provided:   Self management tools provided:

## 2013-09-14 LAB — MICROALBUMIN / CREATININE URINE RATIO
Creatinine, Urine: 220 mg/dL
Microalb Creat Ratio: 3.9 mg/g (ref 0.0–30.0)

## 2013-09-14 MED ORDER — POLYETHYLENE GLYCOL 3350 17 G PO PACK: 17.0000 g | PACK | Freq: Every day | ORAL | Status: DC

## 2013-09-14 NOTE — Telephone Encounter (Signed)
Put in an order for this. Thanks!

## 2013-09-14 NOTE — Progress Notes (Signed)
Case discussed with Dr. Brown at the time of the visit.  We reviewed the resident's history and exam and pertinent patient test results.  I agree with the assessment, diagnosis, and plan of care documented in the resident's note. 

## 2013-09-17 ENCOUNTER — Telehealth: Payer: Self-pay | Admitting: Licensed Clinical Social Worker

## 2013-09-17 NOTE — Telephone Encounter (Signed)
Pt was referred to CSW for assistance with food, utilities and food stamps.  Pt has been referred to [facility] and has been working with [employer].  CSW will discuss case with care manager prior to this making contacting with Ms. Knipp, as to not duplicate services.

## 2013-09-18 ENCOUNTER — Other Ambulatory Visit: Payer: Self-pay | Admitting: Internal Medicine

## 2013-09-18 ENCOUNTER — Ambulatory Visit (HOSPITAL_COMMUNITY)
Admission: RE | Admit: 2013-09-18 | Discharge: 2013-09-18 | Disposition: A | Discharge status: Home / Self Care | Payer: Medicaid Other | Source: Ambulatory Visit | Attending: Cardiology | Admitting: Cardiology

## 2013-09-18 VITALS — BP 151/95 | HR 98 | Wt 287.5 lb

## 2013-09-18 DIAGNOSIS — E669 Obesity, unspecified: Secondary | ICD-10-CM

## 2013-09-18 DIAGNOSIS — I5042 Chronic combined systolic (congestive) and diastolic (congestive) heart failure: Secondary | ICD-10-CM

## 2013-09-18 DIAGNOSIS — I10 Essential (primary) hypertension: Secondary | ICD-10-CM

## 2013-09-18 DIAGNOSIS — G4733 Obstructive sleep apnea (adult) (pediatric): Secondary | ICD-10-CM

## 2013-09-18 NOTE — Progress Notes (Addendum)
Patient ID: [name], female   DOB: 1966-09-18, 47 y.o.   MRN: [id number]  Weight Range   Baseline proBNP   EP: Dr Graciela Husbands PCP: Dr Yetta Barre or Dr. Lovenia Kim internal medicine  --[employer]--  HPI: 47 yo with history of morbid obesity, DM2, OSA on CPAP and combined CHF in the setting of poorly-controlled HTN. She was admitted in 05/2011 with chest pain and new LBBB and ended up getting a left heart cath, which showed no angiographic CAD. Renal artery dopplers were normal in 04/2012.   Was hospitalized May 2014 with acute on chronic combined systolic and diastolic CHF. EF from December 2013 was down to 40 to 45%.   Follow up: Since last visit had CRT-D implanted 08/22/13. Was seen at by EP and BP was up and started Cleda Daub, but she is not sure what medications she is on. Reports she fell twice this week d/t dizziness. + headaches. Aunt and cousin passed unexpectedly on Saturday. Does not know what medications she is taking. +DOE after about 150 ft. Denies SOB, orthopnea, or CP. Not wearing CPAP nightly. Weight at home 283-287 pounds.   ECHO 11/2012: 40-45% ECHO 07/2013: 20% RV normal        08/23/2013: EF 40-45%, diff HK        09/05/2013: EF 25-30%, diff HK   Labs 06/2013: proBNP 230, Cr 0.9, K 3.7          08/2013: K+ 3.9, Cr 0.9  ROS: All systems negative except as listed in HPI, PMH and Problem List.  Past Medical History  Diagnosis Date  . Hypertension     Poorly controlled. Has had HTN since age 68. Angioedema with ACEI.  24 Hr urine and renal arterial dopplers ordered . . . Never done  . Type II or unspecified type diabetes mellitus without mention of complication, uncontrolled DX: 2002  . NICM (nonischemic cardiomyopathy)     EF 45-50% in 8/12, cath 6/12 showed normal coronaries, EF 50-55% by LV gram  . Morbid obesity   . Allergic rhinitis   . HLD (hyperlipidemia)   . OSA on CPAP     sleep study in 8/12 showed moderate to severe OSA requiring CPAP  . Chronic lower back pain    secondary to DJD, obsetiy, hip problems. Followed by Dr. Ivory Broad (pain management)  . 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 Chronic diastolic heart failure      Primarily diastolic CHF: Likely due to uncontrolled HTN. Last echo (8/12) with EF 45-50%, mild to moderate LVH with some asymmetric septal hypertrophy, RV normal size and systolic function. EF 50-55% by LV-gram in 6/12.   . Degeneration of lumbar or lumbosacral intervertebral disc   . Polyneuropathy in diabetes(357.2)   . Thoracic or lumbosacral neuritis or radiculitis, unspecified   . Calcifying tendinitis of shoulder   . LBBB (left bundle branch block)   . Chronic combined systolic and diastolic CHF (congestive heart failure)     EF 40-45% by echo 12/06/2012  . Coronary artery disease     questionable. LHC 05/2011 showing normal coronaries // Followed at [facility], Dr. Shirlee Latch  . GERD (gastroesophageal reflux disease)     Current Outpatient Prescriptions  Medication Sig Dispense Refill  . albuterol (PROVENTIL HFA;VENTOLIN HFA) 108 (90 BASE) MCG/ACT inhaler Inhale 2 puffs into the lungs every 6 (six) hours as needed for wheezing or shortness of breath. For wheezing      .  aspirin EC 81 MG tablet Take 81 mg by mouth every morning.      . Blood Glucose Monitoring Suppl (ACCU-CHEK NANO SMARTVIEW) W/DEVICE KIT 1 each by Does not apply route 3 (three) times daily with meals.  1 kit  0  . citalopram (CELEXA) 20 MG tablet Take 20 mg by mouth every morning.      . cyclobenzaprine (FLEXERIL) 10 MG tablet Take 10 mg by mouth 3 (three) times daily as needed for muscle spasms.      . diclofenac sodium (VOLTAREN) 1 % GEL Apply 2 g topically 4 (four) times daily as needed (for pain).      [name] digoxin (LANOXIN) 0.25 MG tablet Take 0.25 mg by mouth daily.      . furosemide (LASIX) 40 MG tablet Take 40 mg by mouth 2 (two) times daily.      [name] glucose blood (ACCU-CHEK SMARTVIEW) test strip Use as instructed  100 each   12  . hydrALAZINE (APRESOLINE) 25 MG tablet Take 25 mg by mouth 3 (three) times daily.      [name] ibuprofen (ADVIL,MOTRIN) 200 MG tablet Take 600 mg by mouth every 6 (six) hours as needed for pain.      [name] insulin aspart (NOVOLOG FLEXPEN) 100 UNIT/ML injection Inject 5 Units into the skin 3 (three) times daily before meals.  1 vial  11  . insulin glargine (LANTUS) 100 UNIT/ML injection Inject 0.16 mLs (16 Units total) into the skin at bedtime. DX: 250.02. Will need 3 pens/month  3 pen  11  . Insulin Pen Needle 31G X 5 MM MISC Use to inject insulin 4 times a day. Dx code: 39.62.Insulin dependent.  120 each  11  . isosorbide mononitrate (IMDUR) 30 MG 24 hr tablet Take 30 mg by mouth daily.      . metoprolol succinate (TOPROL-XL) 50 MG 24 hr tablet Take 1 tablet (50 mg total) by mouth daily.  30 tablet  3  . Misc. Devices (ROLLATOR) MISC Use daily for ambulation  1 each  0  . oxyCODONE-acetaminophen (PERCOCET) 10-325 MG per tablet Take 1 tablet by mouth every 8 (eight) hours as needed for pain. For pain  70 tablet  0  . pantoprazole (PROTONIX) 40 MG tablet Take 40 mg by mouth daily.      . polyethylene glycol (MIRALAX) packet Take 17 g by mouth daily.  14 each  0  . simvastatin (ZOCOR) 40 MG tablet Take 40 mg by mouth daily at 6 PM.      . spironolactone (ALDACTONE) 25 MG tablet Take 1 tablet (25 mg total) by mouth daily.  90 tablet  3  . traMADol (ULTRAM) 50 MG tablet Take 1 tablet (50 mg total) by mouth every 6 (six) hours as needed for pain.  20 tablet  0   No current facility-administered medications for this encounter.    Filed Vitals:   09/18/13 1334  BP: 151/95  Pulse: 98  Weight: 287 lb 8 oz (130.409 kg)  SpO2: 100%   PHYSICAL EXAM: General:  Fatigued appearing. No resp difficulty HEENT: normal Neck: supple. Thick. Hard to see JVP, but appears elevated. Carotids 2+ bilaterally; no bruits. No lymphadenopathy appreciated.  Cor: PMI nonpalpable Regular rate & rhythm. No rubs, gallops or  murmurs. Split s2 Lungs: clear Abdomen: obese soft, nontender, nondistended. No hepatosplenomegaly. No bruits or masses. Good bowel sounds. Extremities: no cyanosis, clubbing, rash, 1+edema Neuro: alert & orientedx3, cranial nerves grossly intact. Moves all 4 extremities w/o  difficulty. Affect pleasant.   ASSESSMENT:  1. Chronic systolic HF, NICM likely r/t HTN, EF 25-30% (08/2013) s/p CRT-D-[employer] - Ms. Yasui's EF is decreased from 08/23/13 to 09/05/13. She had a CRT-D placed in early September. She continues to struggle with high blood pressure and today presents to the clinic confused about what medications she needs to be taking. Long discussion with her about the importance of taking her medications regularly. She has been complaining of dizziness. Will set up for [facility] to come and help set up pill box. Have asked her to bring medications to next visit. - NYHA III symptoms and volume status is slightly elevated will increase lasix to 60/40 mg for 2 days and then go back to 40 mg BID.  - Have asked the patient to restart her spiro 12.5 mg daily, will check BMET in [redacted] week along with digoxin. - Continue hydralazine 25 mg TID, INUDR 30 mg, digoxin 0.25 mg and Toprol 50 mg daily.  - She is not on ACE-I d/t angioedema - Reinforced the need and importance of daily weights, a low sodium diet, and fluid restriction (less than 2 L a day). Instructed to call the HF clinic if weight increases more than 3 lbs overnight or 5 lbs in a week.   2. Uncontrolled HTN - Difficult to adjust medications since the patient is not quite sure what medications she is taking. She says she is taking her hydralazine TID and Toprol. She does not think she has been taking Cleda Daub. As above SBP elevated will start spiro 12.5 mg back daily.  3. Morbid obesity - Encouraged weight loss with portion control  4. OSA on CPAP - Reinforced the need to wear her CPAP nightly and that not wearing it can increase the stress on her  heart.   5. HLD - Last checked in computer in 11/2012. - Need to assess whether PCP is managing. - Have asked her to restart statin, she thought she was supposed to be off of it.  F/U 2 weeks with Dr. Shirlee Latch and 1 month in clinic.  Ulla Potash B NP-C 4:54 PM

## 2013-09-18 NOTE — Patient Instructions (Addendum)
Increase lasix for 2 days to 60 mg (3 tabs) in the am and 40 mg (2 tabs) in pm.  Start taking Spironolactone 12.5 mg daily.  Start taking simvastatin 40 mg daily.  Will need labs in 7 days.  Follow up 2 weeks.  Do the following things EVERYDAY: 1) Weigh yourself in the morning before breakfast. Write it down and keep it in a log. 2) Take your medicines as prescribed 3) Eat low salt foods-Limit salt (sodium) to 2000 mg per day.  4) Stay as active as you can everyday 5) Limit all fluids for the day to less than 2 liters 6)

## 2013-09-19 NOTE — Telephone Encounter (Signed)
CSW placed call to [name] at [facility].  [name] was in need of referral for pt to receive [facility] RN monthly for medication pill box fill.  CSW placed call to [name] to determine services needed.  Pt states she is now part of the CHF clinic and has been having increased trouble in the home, stating "I need help with me".  Further inquiry into this statement, pt in need of personal care services.  Pt states she is having increased difficulty getting in/out of tub and providing personal care for herself.  [name] in agreement, [facility] care manager request for monthly home health RN for medication pill box filling and review.  Pt is familiar with [facility].  Discussed with [name] order placed on chart, physician in agreemetn for request for PCS.  CSW will fax to [facility] per pt request.  Request for PCS initiated and placed in [name] mailbox.

## 2013-09-20 ENCOUNTER — Encounter: Payer: Self-pay | Admitting: Internal Medicine

## 2013-09-20 MED ORDER — GLUCOSE BLOOD VI STRP: ORAL_STRIP | Status: DC

## 2013-09-20 MED ORDER — INSULIN GLARGINE 100 UNIT/ML SOLOSTAR PEN: 16.0000 [IU] | PEN_INJECTOR | Freq: Every day | SUBCUTANEOUS | Status: DC

## 2013-09-20 NOTE — Addendum Note (Signed)
Addended by: Linward Headland on: 09/20/2013 01:21 PM   Modules accepted: Orders, Medications

## 2013-09-21 ENCOUNTER — Encounter: Payer: Self-pay | Admitting: *Deleted

## 2013-09-21 ENCOUNTER — Ambulatory Visit: Payer: Medicaid Other | Admitting: Pulmonary Disease

## 2013-09-24 ENCOUNTER — Observation Stay (HOSPITAL_COMMUNITY)
Admission: EM | Admit: 2013-09-24 | Discharge: 2013-09-25 | Disposition: A | Discharge status: Home / Self Care | Payer: Medicaid Other | Attending: Cardiovascular Disease | Admitting: Cardiovascular Disease

## 2013-09-24 ENCOUNTER — Emergency Department (HOSPITAL_COMMUNITY): Payer: Medicaid Other

## 2013-09-24 ENCOUNTER — Encounter (HOSPITAL_COMMUNITY): Payer: Self-pay | Admitting: Emergency Medicine

## 2013-09-24 ENCOUNTER — Observation Stay (HOSPITAL_COMMUNITY): Payer: Medicaid Other

## 2013-09-24 DIAGNOSIS — M5137 Other intervertebral disc degeneration, lumbosacral region: Secondary | ICD-10-CM | POA: Insufficient documentation

## 2013-09-24 DIAGNOSIS — M51379 Other intervertebral disc degeneration, lumbosacral region without mention of lumbar back pain or lower extremity pain: Secondary | ICD-10-CM | POA: Insufficient documentation

## 2013-09-24 DIAGNOSIS — K219 Gastro-esophageal reflux disease without esophagitis: Secondary | ICD-10-CM | POA: Insufficient documentation

## 2013-09-24 DIAGNOSIS — E785 Hyperlipidemia, unspecified: Secondary | ICD-10-CM | POA: Insufficient documentation

## 2013-09-24 DIAGNOSIS — G4733 Obstructive sleep apnea (adult) (pediatric): Secondary | ICD-10-CM | POA: Insufficient documentation

## 2013-09-24 DIAGNOSIS — I10 Essential (primary) hypertension: Secondary | ICD-10-CM | POA: Insufficient documentation

## 2013-09-24 DIAGNOSIS — I428 Other cardiomyopathies: Secondary | ICD-10-CM | POA: Insufficient documentation

## 2013-09-24 DIAGNOSIS — M1610 Unilateral primary osteoarthritis, unspecified hip: Secondary | ICD-10-CM | POA: Insufficient documentation

## 2013-09-24 DIAGNOSIS — Z794 Long term (current) use of insulin: Secondary | ICD-10-CM | POA: Insufficient documentation

## 2013-09-24 DIAGNOSIS — I2510 Atherosclerotic heart disease of native coronary artery without angina pectoris: Secondary | ICD-10-CM | POA: Insufficient documentation

## 2013-09-24 DIAGNOSIS — IMO0002 Reserved for concepts with insufficient information to code with codable children: Secondary | ICD-10-CM | POA: Insufficient documentation

## 2013-09-24 DIAGNOSIS — Z9889 Other specified postprocedural states: Secondary | ICD-10-CM | POA: Insufficient documentation

## 2013-09-24 DIAGNOSIS — Z888 Allergy status to other drugs, medicaments and biological substances status: Secondary | ICD-10-CM | POA: Insufficient documentation

## 2013-09-24 DIAGNOSIS — E1149 Type 2 diabetes mellitus with other diabetic neurological complication: Secondary | ICD-10-CM | POA: Insufficient documentation

## 2013-09-24 DIAGNOSIS — Z79899 Other long term (current) drug therapy: Secondary | ICD-10-CM | POA: Insufficient documentation

## 2013-09-24 DIAGNOSIS — E1142 Type 2 diabetes mellitus with diabetic polyneuropathy: Secondary | ICD-10-CM | POA: Insufficient documentation

## 2013-09-24 DIAGNOSIS — Z881 Allergy status to other antibiotic agents status: Secondary | ICD-10-CM | POA: Insufficient documentation

## 2013-09-24 DIAGNOSIS — R0789 Other chest pain: Principal | ICD-10-CM | POA: Insufficient documentation

## 2013-09-24 DIAGNOSIS — J309 Allergic rhinitis, unspecified: Secondary | ICD-10-CM | POA: Insufficient documentation

## 2013-09-24 DIAGNOSIS — I4460 Unspecified fascicular block: Secondary | ICD-10-CM | POA: Insufficient documentation

## 2013-09-24 DIAGNOSIS — R072 Precordial pain: Secondary | ICD-10-CM | POA: Diagnosis present

## 2013-09-24 DIAGNOSIS — M169 Osteoarthritis of hip, unspecified: Secondary | ICD-10-CM | POA: Insufficient documentation

## 2013-09-24 DIAGNOSIS — Z95810 Presence of automatic (implantable) cardiac defibrillator: Secondary | ICD-10-CM | POA: Insufficient documentation

## 2013-09-24 DIAGNOSIS — Z9981 Dependence on supplemental oxygen: Secondary | ICD-10-CM | POA: Insufficient documentation

## 2013-09-24 DIAGNOSIS — E1140 Type 2 diabetes mellitus with diabetic neuropathy, unspecified: Secondary | ICD-10-CM | POA: Diagnosis present

## 2013-09-24 DIAGNOSIS — G8929 Other chronic pain: Secondary | ICD-10-CM | POA: Insufficient documentation

## 2013-09-24 DIAGNOSIS — M7530 Calcific tendinitis of unspecified shoulder: Secondary | ICD-10-CM | POA: Insufficient documentation

## 2013-09-24 DIAGNOSIS — I5042 Chronic combined systolic (congestive) and diastolic (congestive) heart failure: Secondary | ICD-10-CM | POA: Insufficient documentation

## 2013-09-24 DIAGNOSIS — Z7982 Long term (current) use of aspirin: Secondary | ICD-10-CM | POA: Insufficient documentation

## 2013-09-24 LAB — COMPREHENSIVE METABOLIC PANEL
ALT: 11 U/L (ref 0–35)
Albumin: 3.9 g/dL (ref 3.5–5.2)
BUN: 8 mg/dL (ref 6–23)
Calcium: 8.8 mg/dL (ref 8.4–10.5)
Chloride: 98 mEq/L (ref 96–112)
Creatinine, Ser: 0.72 mg/dL (ref 0.50–1.10)
GFR calc non Af Amer: >90 mL/min (ref >90)
Potassium: 3.6 mEq/L (ref 3.5–5.1)
Total Bilirubin: 0.8 mg/dL (ref 0.3–1.2)
Total Protein: 7.4 g/dL (ref 6.0–8.3)

## 2013-09-24 LAB — POCT I-STAT TROPONIN I: Troponin i, poc: 0 ng/mL (ref 0.00–0.08)

## 2013-09-24 LAB — CBC
HCT: 36.4 % (ref 36.0–46.0)
MCH: 27.8 pg (ref 26.0–34.0)
MCV: 83.7 fL (ref 78.0–100.0)
Platelets: 195 10*3/uL (ref 150–400)
RDW: 13.2 % (ref 11.5–15.5)
WBC: 8.6 10*3/uL (ref 4.0–10.5)

## 2013-09-24 LAB — URINALYSIS, ROUTINE W REFLEX MICROSCOPIC
Bilirubin Urine: NEGATIVE
Hgb urine dipstick: NEGATIVE
Nitrite: NEGATIVE
Protein, ur: NEGATIVE mg/dL
Urobilinogen, UA: 0.2 mg/dL (ref 0.0–1.0)

## 2013-09-24 LAB — APTT: aPTT: 31 seconds (ref 24–37)

## 2013-09-24 LAB — PROTIME-INR
INR: 1.02 (ref 0.00–1.49)
Prothrombin Time: 13.2 seconds (ref 11.6–15.2)

## 2013-09-24 LAB — PRO B NATRIURETIC PEPTIDE: Pro B Natriuretic peptide (BNP): 718.7 pg/mL — ABNORMAL HIGH (ref 0–125)

## 2013-09-24 MED ORDER — FUROSEMIDE 40 MG PO TABS
40.0000 mg | ORAL_TABLET | Freq: Two times a day (BID) | ORAL | Status: DC
  Filled 2013-09-24: qty 1

## 2013-09-24 MED ORDER — OXYCODONE-ACETAMINOPHEN 10-325 MG PO TABS: 1.0000 | ORAL_TABLET | Freq: Three times a day (TID) | ORAL | Status: DC | PRN

## 2013-09-24 MED ORDER — ONDANSETRON HCL 4 MG/2ML IJ SOLN: 4.0000 mg | Freq: Four times a day (QID) | INTRAMUSCULAR | Status: DC | PRN

## 2013-09-24 MED ORDER — ASPIRIN 81 MG PO CHEW: 324.0000 mg | CHEWABLE_TABLET | Freq: Once | ORAL | Status: DC

## 2013-09-24 MED ORDER — ISOSORBIDE MONONITRATE ER 30 MG PO TB24
30.0000 mg | ORAL_TABLET | Freq: Every day | ORAL | Status: DC
  Filled 2013-09-24: qty 1

## 2013-09-24 MED ORDER — ALPRAZOLAM 0.25 MG PO TABS: 0.2500 mg | ORAL_TABLET | Freq: Two times a day (BID) | ORAL | Status: DC | PRN

## 2013-09-24 MED ORDER — SODIUM CHLORIDE 0.9 % IV SOLN: 250.0000 mL | INTRAVENOUS | Status: DC | PRN

## 2013-09-24 MED ORDER — IOHEXOL 350 MG/ML SOLN
80.0000 mL | Freq: Once | INTRAVENOUS | Status: AC | PRN
  Administered 2013-09-24: 82 mL via INTRAVENOUS

## 2013-09-24 MED ORDER — FUROSEMIDE 40 MG PO TABS
40.0000 mg | ORAL_TABLET | Freq: Two times a day (BID) | ORAL | Status: DC
  Administered 2013-09-25: 40 mg via ORAL
  Filled 2013-09-24 (×4): qty 1

## 2013-09-24 MED ORDER — HYDRALAZINE HCL 25 MG PO TABS
25.0000 mg | ORAL_TABLET | Freq: Three times a day (TID) | ORAL | Status: DC
  Administered 2013-09-24: 25 mg via ORAL
  Filled 2013-09-24 (×4): qty 1

## 2013-09-24 MED ORDER — OXYCODONE-ACETAMINOPHEN 5-325 MG PO TABS: 1.0000 | ORAL_TABLET | Freq: Three times a day (TID) | ORAL | Status: DC | PRN

## 2013-09-24 MED ORDER — HEPARIN SODIUM (PORCINE) 5000 UNIT/ML IJ SOLN
5000.0000 [IU] | Freq: Three times a day (TID) | INTRAMUSCULAR | Status: DC
  Administered 2013-09-25: 5000 [IU] via SUBCUTANEOUS
  Filled 2013-09-24 (×5): qty 1

## 2013-09-24 MED ORDER — SODIUM CHLORIDE 0.9 % IJ SOLN: 3.0000 mL | INTRAMUSCULAR | Status: DC | PRN

## 2013-09-24 MED ORDER — INSULIN GLARGINE 100 UNIT/ML ~~LOC~~ SOLN
16.0000 [IU] | Freq: Every day | SUBCUTANEOUS | Status: DC
  Administered 2013-09-24: 16 [IU] via SUBCUTANEOUS
  Filled 2013-09-24 (×2): qty 0.16

## 2013-09-24 MED ORDER — CITALOPRAM HYDROBROMIDE 20 MG PO TABS
20.0000 mg | ORAL_TABLET | Freq: Every morning | ORAL | Status: DC
  Filled 2013-09-24: qty 1

## 2013-09-24 MED ORDER — OXYCODONE HCL 5 MG PO TABS: 5.0000 mg | ORAL_TABLET | Freq: Three times a day (TID) | ORAL | Status: DC | PRN

## 2013-09-24 MED ORDER — INSULIN GLARGINE 100 UNIT/ML SOLOSTAR PEN: 16.0000 [IU] | PEN_INJECTOR | Freq: Every day | SUBCUTANEOUS | Status: DC

## 2013-09-24 MED ORDER — DIGOXIN 250 MCG PO TABS
0.2500 mg | ORAL_TABLET | Freq: Every day | ORAL | Status: DC
  Filled 2013-09-24: qty 1

## 2013-09-24 MED ORDER — DICLOFENAC SODIUM 1 % TD GEL
2.0000 g | Freq: Four times a day (QID) | TRANSDERMAL | Status: DC | PRN
  Filled 2013-09-24: qty 100

## 2013-09-24 MED ORDER — INSULIN ASPART 100 UNIT/ML ~~LOC~~ SOLN
5.0000 [IU] | Freq: Three times a day (TID) | SUBCUTANEOUS | Status: DC
  Administered 2013-09-25: 5 [IU] via SUBCUTANEOUS

## 2013-09-24 MED ORDER — ZOLPIDEM TARTRATE 5 MG PO TABS: 5.0000 mg | ORAL_TABLET | Freq: Every evening | ORAL | Status: DC | PRN

## 2013-09-24 MED ORDER — SPIRONOLACTONE 12.5 MG HALF TABLET
12.5000 mg | ORAL_TABLET | Freq: Every day | ORAL | Status: DC
  Filled 2013-09-24: qty 1

## 2013-09-24 MED ORDER — ALBUTEROL SULFATE HFA 108 (90 BASE) MCG/ACT IN AERS
2.0000 | INHALATION_SPRAY | Freq: Four times a day (QID) | RESPIRATORY_TRACT | Status: DC | PRN
  Filled 2013-09-24: qty 6.7

## 2013-09-24 MED ORDER — SODIUM CHLORIDE 0.9 % IJ SOLN
3.0000 mL | Freq: Two times a day (BID) | INTRAMUSCULAR | Status: DC
  Administered 2013-09-24: 3 mL via INTRAVENOUS

## 2013-09-24 MED ORDER — PANTOPRAZOLE SODIUM 40 MG PO TBEC: 40.0000 mg | DELAYED_RELEASE_TABLET | Freq: Every day | ORAL | Status: DC

## 2013-09-24 MED ORDER — NITROGLYCERIN 0.4 MG SL SUBL: 0.4000 mg | SUBLINGUAL_TABLET | SUBLINGUAL | Status: DC | PRN

## 2013-09-24 MED ORDER — SIMVASTATIN 40 MG PO TABS
40.0000 mg | ORAL_TABLET | Freq: Every day | ORAL | Status: DC
  Filled 2013-09-24: qty 1

## 2013-09-24 MED ORDER — METOPROLOL SUCCINATE ER 50 MG PO TB24
50.0000 mg | ORAL_TABLET | Freq: Every day | ORAL | Status: DC
  Filled 2013-09-24: qty 1

## 2013-09-24 MED ORDER — ACETAMINOPHEN 325 MG PO TABS: 650.0000 mg | ORAL_TABLET | ORAL | Status: DC | PRN

## 2013-09-24 MED ORDER — CYCLOBENZAPRINE HCL 10 MG PO TABS
10.0000 mg | ORAL_TABLET | Freq: Three times a day (TID) | ORAL | Status: DC | PRN
  Filled 2013-09-24: qty 1

## 2013-09-24 MED ORDER — ASPIRIN EC 81 MG PO TBEC
81.0000 mg | DELAYED_RELEASE_TABLET | Freq: Every morning | ORAL | Status: DC
  Filled 2013-09-24: qty 1

## 2013-09-24 NOTE — ED Provider Notes (Signed)
CSN: [id number]     Arrival date & time 09/24/13  1653 History   First MD Initiated Contact with Patient 09/24/13 1654     Chief Complaint  Patient presents with  . Chest Pain   (Consider location/radiation/quality/duration/timing/severity/associated sxs/prior Treatment) HPI  [name] is a 47 y.o. female with PMH of IDDM, OSA, HTN and combined CHF complaining of sharp 10/10, non-radiating, exertional, short lived (1-2 minutes) substernal CP onset yesterday, becoming more severe and frequent today. Pt had pacemaker/defibrillator placed on 9/10. Pt has had full dose ASA. Associated symptoms of SOB and HA. Pt denies N/V, diaphoresis, cough, fever, peripheral edema, h/o DVT/PE, recent immobilizations, calf swelling or pain.   Dry weight: 275 Last cath 2012 - non-obstructive CAD   Past Medical History  Diagnosis Date  . Hypertension     Poorly controlled. Has had HTN since age 54. Angioedema with ACEI.  24 Hr urine and renal arterial dopplers ordered . . . Never done  . Type II or unspecified type diabetes mellitus without mention of complication, uncontrolled DX: 2002  . NICM (nonischemic cardiomyopathy)     EF 45-50% in 8/12, cath 6/12 showed normal coronaries, EF 50-55% by LV gram  . Morbid obesity   . Allergic rhinitis   . HLD (hyperlipidemia)   . OSA on CPAP     sleep study in 8/12 showed moderate to severe OSA requiring CPAP  . Chronic lower back pain     secondary to DJD, obsetiy, hip problems. Followed by Dr. Ivory Broad (pain management)  . 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 Chronic diastolic heart failure      Primarily diastolic CHF: Likely due to uncontrolled HTN. Last echo (8/12) with EF 45-50%, mild to moderate LVH with some asymmetric septal hypertrophy, RV normal size and systolic function. EF 50-55% by LV-gram in 6/12.   . Degeneration of lumbar or lumbosacral intervertebral disc   . Polyneuropathy in diabetes(357.2)   . Thoracic or  lumbosacral neuritis or radiculitis, unspecified   . Calcifying tendinitis of shoulder   . LBBB (left bundle branch block)   . Chronic combined systolic and diastolic CHF (congestive heart failure)     EF 40-45% by echo 12/06/2012  . Coronary artery disease     questionable. LHC 05/2011 showing normal coronaries // Followed at [facility], Dr. Shirlee Latch  . GERD (gastroesophageal reflux disease)    Past Surgical History  Procedure Laterality Date  . Breast biopsies Bilateral 2011    patient reports benign results  . Tubal ligation  05/31/1985  . Hand surgery Left   . Shoulder surgery Right   . Dental surgery      tumors removed   . Cardiac catheterization  05/2011  . Pacemaker/defibrillator placed  08/22/13 Left 08/22/13   Family History  Problem Relation Age of Onset  . Heart disease Mother 55    Died of MI at age 66 yo  . Heart disease Paternal Grandmother     requiring pacemaker.  [name] Heart disease Paternal Grandfather 67    Died of MI at possibly age 70-53yo  . Stroke Paternal Grandfather   . Heart disease Father 51    MI age 66yo requiring stenting  . Kidney disease Mother     requiring dialysis  . Congestive Heart Failure Mother   . Diabetes Father   . Diabetes Brother   . Heart disease Brother 46    MI at age 53 years  old  . Breast cancer Paternal Aunt   . Breast cancer Maternal Grandmother    History  Substance Use Topics  . Smoking status: Never Smoker   . Smokeless tobacco: Never Used  . Alcohol Use: No   OB History   Grav Para Term Preterm Abortions TAB SAB Ect Mult Living                 Review of Systems 10 systems reviewed and found to be negative, except as noted in the HPI   Allergies  Clindamycin; Enalapril maleate; and Lisinopril  Home Medications   Current Outpatient Rx  Name  Route  Sig  Dispense  Refill  . albuterol (PROVENTIL HFA;VENTOLIN HFA) 108 (90 BASE) MCG/ACT inhaler   Inhalation   Inhale 2 puffs into the lungs every 6 (six)  hours as needed for wheezing or shortness of breath. For wheezing         . aspirin EC 81 MG tablet   Oral   Take 81 mg by mouth every morning.         . Blood Glucose Monitoring Suppl (ACCU-CHEK NANO SMARTVIEW) W/DEVICE KIT   Does not apply   1 each by Does not apply route 3 (three) times daily with meals.   1 kit   0   . citalopram (CELEXA) 20 MG tablet   Oral   Take 20 mg by mouth every morning.         . cyclobenzaprine (FLEXERIL) 10 MG tablet   Oral   Take 10 mg by mouth 3 (three) times daily as needed for muscle spasms.         . diclofenac sodium (VOLTAREN) 1 % GEL   Topical   Apply 2 g topically 4 (four) times daily as needed (for pain).         [name] digoxin (LANOXIN) 0.25 MG tablet   Oral   Take 0.25 mg by mouth daily.         . furosemide (LASIX) 40 MG tablet   Oral   Take 40 mg by mouth 2 (two) times daily.         [name] glucose blood (ACCU-CHEK SMARTVIEW) test strip      Use to check blood sugar 3 times per day   100 each   12     Strips for Accu-Chek Nano Smartview Glucometer.  D ...   . hydrALAZINE (APRESOLINE) 25 MG tablet   Oral   Take 25 mg by mouth 3 (three) times daily.         [name] ibuprofen (ADVIL,MOTRIN) 200 MG tablet   Oral   Take 600 mg by mouth every 6 (six) hours as needed for pain.         [name] insulin aspart (NOVOLOG FLEXPEN) 100 UNIT/ML injection   Subcutaneous   Inject 5 Units into the skin 3 (three) times daily before meals.   1 vial   11   . Insulin Glargine (LANTUS SOLOSTAR) 100 UNIT/ML SOPN   Subcutaneous   Inject 16 Units into the skin at bedtime.   5 pen   11   . Insulin Pen Needle 31G X 5 MM MISC      Use to inject insulin 4 times a day. Dx code: 25.62.Insulin dependent.   120 each   11   . isosorbide mononitrate (IMDUR) 30 MG 24 hr tablet   Oral   Take 30 mg by mouth daily.         [name]  metoprolol succinate (TOPROL-XL) 50 MG 24 hr tablet   Oral   Take 1 tablet (50 mg total) by mouth daily.   30  tablet   3   . Misc. Devices (ROLLATOR) MISC      Use daily for ambulation   1 each   0   . oxyCODONE-acetaminophen (PERCOCET) 10-325 MG per tablet   Oral   Take 1 tablet by mouth every 8 (eight) hours as needed for pain. For pain   70 tablet   0   . pantoprazole (PROTONIX) 40 MG tablet   Oral   Take 40 mg by mouth daily.         . polyethylene glycol (MIRALAX) packet   Oral   Take 17 g by mouth daily.   14 each   0   . simvastatin (ZOCOR) 40 MG tablet   Oral   Take 40 mg by mouth daily at 6 PM.         . spironolactone (ALDACTONE) 25 MG tablet   Oral   Take 1 tablet (25 mg total) by mouth daily.   90 tablet   3   . traMADol (ULTRAM) 50 MG tablet   Oral   Take 1 tablet (50 mg total) by mouth every 6 (six) hours as needed for pain.   20 tablet   0    BP 148/94  Temp(Src) 97.9 F (36.6 C) (Oral)  Resp 22  Ht 5\' 9"  (1.753 m)  Wt 273 lb (123.832 kg)  BMI 40.3 kg/m2  SpO2 99% Physical Exam  Nursing note and vitals reviewed. Constitutional: She is oriented to person, place, and time. She appears well-developed. No distress.  obese  HENT:  Head: Normocephalic.  Mouth/Throat: Oropharynx is clear and moist.  Eyes: Conjunctivae and EOM are normal. Pupils are equal, round, and reactive to light.  Neck: Normal range of motion.  Cardiovascular: Normal rate, regular rhythm, normal heart sounds and intact distal pulses.   Pulmonary/Chest: Effort normal. No stridor. No respiratory distress. She has no wheezes. She has no rales. She exhibits tenderness.    Abdominal: Soft. Bowel sounds are normal. She exhibits no distension and no mass. There is no tenderness. There is no rebound and no guarding.  Musculoskeletal: Normal range of motion. She exhibits no edema.  No calf asymmetry, superficial collaterals, palpable cords, edema, Homans sign negative bilaterally.   Neurological: She is alert and oriented to person, place, and time.  Psychiatric: She has a normal  mood and affect.    ED Course  Procedures (including critical care time) Labs Review Labs Reviewed  PRO B NATRIURETIC PEPTIDE - Abnormal; Notable for the following:    Pro B Natriuretic peptide (BNP) 718.7 (*)    All other components within normal limits  COMPREHENSIVE METABOLIC PANEL - Abnormal; Notable for the following:    Glucose, Bld 125 (*)    All other components within normal limits  CBC  PROTIME-INR  APTT  URINALYSIS, ROUTINE W REFLEX MICROSCOPIC  POCT I-STAT TROPONIN I   Imaging Review Dg Chest 2 View  09/24/2013   CLINICAL DATA:  Chest pain  EXAM: CHEST  2 VIEW  COMPARISON:  August 23, 2013  FINDINGS: Lungs are clear. Heart is mildly enlarged with normal pulmonary vascularity. Pacemaker leads are attached to the right atrium, right ventricle, and left ventricle. No adenopathy. No pneumothorax. No bone lesions.  IMPRESSION: Cardiomegaly. No edema or consolidation.   Electronically Signed   By: Bretta Bang M.D.  On: 09/24/2013 18:20    EKG Interpretation     Ventricular Rate:  92 PR Interval:  162 QRS Duration: 119 QT Interval:  423 QTC Calculation: 524 R Axis:   136 Text Interpretation:  Sinus rhythm Probable left atrial enlargement Left posterior fascicular block Probable anteroseptal infarct, recent No significant change was found            MDM   1. Chest discomfort   2. Chronic combined systolic and diastolic heart failure     Filed Vitals:   09/24/13 1703 09/24/13 1845 09/24/13 2123 09/24/13 2226  BP: 148/94  132/88 140/82  Pulse:   84 92  Temp: 97.9 F (36.6 C)  98.8 F (37.1 C) 98.3 F (36.8 C)  TempSrc: Oral  Oral Oral  Resp: 22  18   Height: 5\' 9"  (1.753 m)     Weight: 273 lb (123.832 kg) 278 lb 4.8 oz (126.236 kg)    SpO2: 99%  99% 97%     [name] is a 47 y.o. female extensive cardiac history complaining of chest tightness, EKG is nonischemic and troponin is negative. Patient does have a mildly elevated BNP at  718. Does not appear to be clinically volume overloaded. Cardiology consult from Dr. Lourena Simmonds appreciated: he has evaluated the patient and will admit her to the cardiology service.   Medications  aspirin chewable tablet 324 mg (324 mg Oral Not Given 09/24/13 1717)  nitroGLYCERIN (NITROSTAT) SL tablet 0.4 mg (not administered)  insulin aspart (novoLOG) injection 5 Units (not administered)  spironolactone (ALDACTONE) tablet 12.5 mg (not administered)  albuterol (PROVENTIL HFA;VENTOLIN HFA) 108 (90 BASE) MCG/ACT inhaler 2 puff (not administered)  aspirin EC tablet 81 mg (not administered)  citalopram (CELEXA) tablet 20 mg (not administered)  cyclobenzaprine (FLEXERIL) tablet 10 mg (not administered)  diclofenac sodium (VOLTAREN) 1 % transdermal gel 2 g (not administered)  digoxin (LANOXIN) tablet 0.25 mg (not administered)  hydrALAZINE (APRESOLINE) tablet 25 mg (not administered)  isosorbide mononitrate (IMDUR) 24 hr tablet 30 mg (not administered)  metoprolol succinate (TOPROL-XL) 24 hr tablet 50 mg (not administered)  pantoprazole (PROTONIX) EC tablet 40 mg (not administered)  simvastatin (ZOCOR) tablet 40 mg (not administered)  acetaminophen (TYLENOL) tablet 650 mg (not administered)  ondansetron (ZOFRAN) injection 4 mg (not administered)  ALPRAZolam (XANAX) tablet 0.25 mg (not administered)  sodium chloride 0.9 % injection 3 mL (not administered)  sodium chloride 0.9 % injection 3 mL (not administered)  0.9 %  sodium chloride infusion (not administered)  heparin injection 5,000 Units (not administered)  zolpidem (AMBIEN) tablet 5 mg (not administered)  insulin glargine (LANTUS) injection 16 Units (16 Units Subcutaneous Given 09/24/13 2159)  oxyCODONE-acetaminophen (PERCOCET/ROXICET) 5-325 MG per tablet 1 tablet (not administered)    And  oxyCODONE (Oxy IR/ROXICODONE) immediate release tablet 5 mg (not administered)  furosemide (LASIX) tablet 40 mg (not administered)  iohexol  (OMNIPAQUE) 350 MG/ML injection 80 mL (82 mLs Intravenous Contrast Given 09/24/13 2143)    Note: Portions of this report may have been transcribed using voice recognition software. Every effort was made to ensure accuracy; however, inadvertent computerized transcription errors may be present      Wynetta Emery, PA-C 09/24/13 2328

## 2013-09-24 NOTE — ED Notes (Signed)
As per pt she recived aspirin by EMS

## 2013-09-24 NOTE — ED Notes (Signed)
Patient states tightness/aching in chest starting today around 1400, patient states some sob associated with cp, patient with recent pacemaker/defib placed in sept. 2014,

## 2013-09-24 NOTE — H&P (Signed)
History and Physical   Patient ID: [name] MRN: [id number], DOB/AGE: 47/28/1967   Admit date: 09/24/2013 Date of Consult: 09/24/2013  Primary Physician: Lars Masson, MD Primary Cardiologist: Golden Circle, MD Primary Electrophysiologist: Odessa Fleming, MD  HPI: [name] is a 47 y.o. African-American female w/ PMHx s/f chronic combined CHF/NICM (08/2013 echo- EF 25-30%) s/p CRT-D, morbid obesity, OSA (noncompliant w/ CPAP), poorly controlled HTN, HLD, DM2 with diabetic neuropathy and GERD who presents today with chest pain.   She underwent cardiac catheterization in 05/2011 revealing no angiographic CAD. She developed progressively worsening EF < 35%, LBBB and CRT-D was implanted 08/2013. She has been initiated on OMT including hydralazine/nitrates, spironolactone, digoxin. No ACEi d/t angioedema. NYHA class III at baseline. Weight 283-287 lbs at baseline. Normal device interrogation 08/31/13. She recently followed up with Ulla Potash, NP-C in the HF clinic. There was confusion as to which medications she should be taking. [facility] was called to set up a pill box. BP elevated- spironolactone was restarted. Lasix was increased for 2 days. Non-pharmacologic CHF education provided. Statin restarted.   She is a poor historian overall. She reports experiencing tenderness to her left-sided chest directly over her ICD site radiating to her left shoulder with associated shortness of breath. No aggravation with exertion, meals. Some aggravation with position changes. She relates this to anxiety. She notes recent sudden deaths in the family. These episodes have been intermittent since last month when she received the ICD. She reports losing her balance and falling at home. She saw Dr. Antoine Poche 9/16 c/o of a similar discomfort. He suspected LV pacing may be causing some discomfort. Follow-up 2D echo 09/05/13 indicated the above EF and trivial pericardial effusion.   In the ED, EKG reveals V-paced with  underlying sinus rhythm. Initial POC TnI. Pro BNP 718. CMET, CBC WNL. U/a unremarkable. PA/lateral CXR indicates cardiomegaly otherwise acute process.   Problem List: Past Medical History  Diagnosis Date  . Hypertension     Poorly controlled. Has had HTN since age 41. Angioedema with ACEI.  24 Hr urine and renal arterial dopplers ordered . . . Never done  . Type II or unspecified type diabetes mellitus without mention of complication, uncontrolled DX: 2002  . NICM (nonischemic cardiomyopathy)     EF 45-50% in 8/12, cath 6/12 showed normal coronaries, EF 50-55% by LV gram  . Morbid obesity   . Allergic rhinitis   . HLD (hyperlipidemia)   . OSA on CPAP     sleep study in 8/12 showed moderate to severe OSA requiring CPAP  . Chronic lower back pain     secondary to DJD, obsetiy, hip problems. Followed by Dr. Ivory Broad (pain management)  . 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 Chronic diastolic heart failure      Primarily diastolic CHF: Likely due to uncontrolled HTN. Last echo (8/12) with EF 45-50%, mild to moderate LVH with some asymmetric septal hypertrophy, RV normal size and systolic function. EF 50-55% by LV-gram in 6/12.   . Degeneration of lumbar or lumbosacral intervertebral disc   . Polyneuropathy in diabetes(357.2)   . Thoracic or lumbosacral neuritis or radiculitis, unspecified   . Calcifying tendinitis of shoulder   . LBBB (left bundle branch block)   . Chronic combined systolic and diastolic CHF (congestive heart failure)     EF 40-45% by echo 12/06/2012  . Coronary artery disease     questionable. LHC 05/2011 showing normal  coronaries // Followed at [facility], Dr. Shirlee Latch  . GERD (gastroesophageal reflux disease)     Past Surgical History  Procedure Laterality Date  . Breast biopsies Bilateral 2011    patient reports benign results  . Tubal ligation  05/31/1985  . Hand surgery Left   . Shoulder surgery Right   . Dental surgery       tumors removed   . Cardiac catheterization  05/2011  . Pacemaker/defibrillator placed  08/22/13 Left 08/22/13     Allergies:  Allergies  Allergen Reactions  . Clindamycin Anaphylaxis and Swelling    Swelling of pain  . Enalapril Maleate Swelling  . Lisinopril Swelling    Home Medications: Prior to Admission medications   Medication Sig Start Date End Date Taking? Authorizing Provider  albuterol (PROVENTIL HFA;VENTOLIN HFA) 108 (90 BASE) MCG/ACT inhaler Inhale 2 puffs into the lungs every 6 (six) hours as needed for wheezing or shortness of breath. For wheezing 06/06/13  Yes Bronson Curb, MD  aspirin EC 81 MG tablet Take 81 mg by mouth every morning. 06/06/13  Yes Bronson Curb, MD  citalopram (CELEXA) 20 MG tablet Take 20 mg by mouth every morning. 06/06/13  Yes Bronson Curb, MD  cyclobenzaprine (FLEXERIL) 10 MG tablet Take 10 mg by mouth 3 (three) times daily as needed for muscle spasms. 06/25/13  Yes Ranelle Oyster, MD  diclofenac sodium (VOLTAREN) 1 % GEL Apply 2 g topically 4 (four) times daily as needed (for pain). 08/03/13  Yes Ranelle Oyster, MD  digoxin (LANOXIN) 0.25 MG tablet Take 0.25 mg by mouth daily. 07/19/13  Yes Aundria Rud, NP  furosemide (LASIX) 40 MG tablet Take 40 mg by mouth 2 (two) times daily. 06/06/13  Yes Bronson Curb, MD  hydrALAZINE (APRESOLINE) 25 MG tablet Take 25 mg by mouth 3 (three) times daily. 07/19/13  Yes Aundria Rud, NP  ibuprofen (ADVIL,MOTRIN) 200 MG tablet Take 600 mg by mouth every 6 (six) hours as needed for pain.   Yes Historical Provider, MD  insulin aspart (NOVOLOG FLEXPEN) 100 UNIT/ML injection Inject 5 Units into the skin 3 (three) times daily before meals. 09/13/13  Yes Linward Headland, MD  Insulin Glargine (LANTUS SOLOSTAR) 100 UNIT/ML SOPN Inject 16 Units into the skin at bedtime. 09/20/13  Yes Linward Headland, MD  isosorbide mononitrate (IMDUR) 30 MG 24 hr tablet Take 30 mg by mouth daily. 07/19/13  Yes Aundria Rud, NP  metoprolol  succinate (TOPROL-XL) 50 MG 24 hr tablet Take 1 tablet (50 mg total) by mouth daily. 08/24/13  Yes Brooke O Edmisten, PA-C  oxyCODONE-acetaminophen (PERCOCET) 10-325 MG per tablet Take 1 tablet by mouth every 8 (eight) hours as needed for pain. For pain 09/03/13  Yes Ranelle Oyster, MD  pantoprazole (PROTONIX) 40 MG tablet Take 40 mg by mouth daily. 06/06/13  Yes Bronson Curb, MD  simvastatin (ZOCOR) 40 MG tablet Take 40 mg by mouth daily at 6 PM. 07/24/13  Yes Dow Adolph, MD  spironolactone (ALDACTONE) 25 MG tablet Take 1 tablet (25 mg total) by mouth daily. 08/28/13  Yes Rollene Rotunda, MD  Blood Glucose Monitoring Suppl (ACCU-CHEK NANO SMARTVIEW) W/DEVICE KIT 1 each by Does not apply route 3 (three) times daily with meals. 08/20/13   Farley Ly, MD  glucose blood (ACCU-CHEK SMARTVIEW) test strip Use to check blood sugar 3 times per day 09/20/13   Linward Headland, MD  Insulin Pen Needle 31G X 5 MM MISC Use to inject  insulin 4 times a day. Dx code: 87.62.Insulin dependent. 09/06/13   Courtney Paris, MD  Misc. Devices (ROLLATOR) MISC Use daily for ambulation 06/25/13   Ranelle Oyster, MD    Inpatient Medications:  . aspirin  324 mg Oral Once    (Not in a hospital admission)  Family History  Problem Relation Age of Onset  . Heart disease Mother 68    Died of MI at age 21 yo  . Heart disease Paternal Grandmother     requiring pacemaker.  [name] Heart disease Paternal Grandfather 2    Died of MI at possibly age 66-53yo  . Stroke Paternal Grandfather   . Heart disease Father 68    MI age 39yo requiring stenting  . Kidney disease Mother     requiring dialysis  . Congestive Heart Failure Mother   . Diabetes Father   . Diabetes Brother   . Heart disease Brother 72    MI at age 39 years old  . Breast cancer Paternal Aunt   . Breast cancer Maternal Grandmother      History   Social History  . Marital Status: Single    Spouse Name: N/A    Number of Children: 2  . Years of  Education: 9th grade   Occupational History  . Unemployed     planning on getting disability   Social History Main Topics  . Smoking status: Never Smoker   . Smokeless tobacco: Never Used  . Alcohol Use: No  . Drug Use: No  . Sexual Activity: Yes    Birth Control/ Protection: Surgical   Other Topics Concern  . Not on file   Social History Narrative   Lives in [place] with her son. Is able to read and write fluently in [place].     Review of Systems: General: negative for chills, fever, night sweats or weight changes.  Cardiovascular: positive for chest pain, dyspnea on exertion, shortness of breath, denies edema, orthopnea, palpitations, paroxysmal nocturnal dyspnea Dermatological: negative for rash Respiratory: negative for cough or wheezing Urologic: negative for hematuria Abdominal: negative for nausea, vomiting, diarrhea, bright red blood per rectum, melena, or hematemesis Neurologic:  negative for visual changes, syncope, or dizziness All other systems reviewed and are otherwise negative except as noted above.  Physical Exam: Blood pressure 148/94, temperature 97.9 F (36.6 C), temperature source Oral, resp. rate 22, height 5\' 9"  (1.753 m), weight 126.236 kg (278 lb 4.8 oz), last menstrual period 08/26/2013, SpO2 99.00%.    General:  Well developed, obese-appearing female in no acute distress. Head: Normocephalic, atraumatic, sclera non-icteric, no xanthomas, nares are without discharge.  Neck: Negative for carotid bruits. JVD not elevated. Lungs: Clear bilaterally to auscultation without wheezes, rales, or rhonchi. Breathing is unlabored. Heart: RRR with S1 S2. No murmurs, rubs, or gallops appreciated. Tenderness of ICD insertion site.  Abdomen: Soft, non-tender, non-distended with normoactive bowel sounds. No hepatomegaly. No rebound/guarding. No obvious abdominal masses. Msk: Strength and tone appears normal for age. Extremities: No clubbing, cyanosis or edema.   Distal pedal pulses are 2+ and equal bilaterally. Neuro: Alert and oriented X 3. Moves all extremities spontaneously. Psych:  Responds to questions appropriately with a normal affect.  Labs: Recent Labs     09/24/13  1842  WBC  8.6  HGB  12.1  HCT  36.4  MCV  83.7  PLT  195   Recent Labs Lab 09/24/13 1842  NA 137  K 3.6  CL 98  CO2 26  BUN 8  CREATININE 0.72  CALCIUM 8.8  PROT 7.4  BILITOT 0.8  ALKPHOS 65  ALT 11  AST 13  GLUCOSE 125*   Radiology/Studies: Dg Chest 2 View  09/24/2013   CLINICAL DATA:  Chest pain  EXAM: CHEST  2 VIEW  COMPARISON:  August 23, 2013  FINDINGS: Lungs are clear. Heart is mildly enlarged with normal pulmonary vascularity. Pacemaker leads are attached to the right atrium, right ventricle, and left ventricle. No adenopathy. No pneumothorax. No bone lesions.  IMPRESSION: Cardiomegaly. No edema or consolidation.   Electronically Signed   By: Bretta Bang M.D.   On: 09/24/2013 18:20   US Soft Tissue Head/neck  09/06/2013   CLINICAL DATA:  Goiter.  EXAM: THYROID ULTRASOUND  TECHNIQUE: Ultrasound examination of the thyroid gland and adjacent soft tissues was performed.  COMPARISON:  None available  FINDINGS: Right thyroid lobe  Measurements: 54 x 18 x 21 mm.  No nodules visualized.  Left thyroid lobe  Measurements: 46 x 9 x 13 mm. 3 x 4 x 4 mm hypoechoic/cystic lesion, inferior pole  Isthmus  Thickness: 2 mm.  No nodules visualized.  Lymphadenopathy  None visualized.  IMPRESSION: Normal-sized thyroid with a single small left cystic lesion. Findings do not meet current consensus criteria for biopsy. Follow-up by clinical exam is recommended. If patient has known risk factors for thyroid carcinoma, consider follow-up ultrasound in 12 months. If patient is clinically hyperthyroid, consider nuclear medicine thyroid uptake and scan. This recommendation follows the consensus statement: Management of Thyroid Nodules Detected as [place]: Society of Radiologists in  Ultrasound Consensus Conference Statement. Radiology 2005; [id number].   Electronically Signed   By: Oley Balm M.D.   On: 09/06/2013 14:04   EKG: NSR, 92 bpm, LBBB, RAD, no ST/T changes  ASSESSMENT AND PLAN:   47 y.o. African-American female w/ PMHx s/f chronic combined CHF/NICM (08/2013 echo- EF 25-30%) s/p CRT-D, morbid obesity, OSA (noncompliant w/ CPAP), poorly controlled HTN, HLD, DM2 with diabetic neuropathy and GERD who presents today with chest pain.   1. Atypical chest pain 2. Chronic combined CHF/nonischemic cardiomyopathy, EF 25-30%  A. S/p [employer] CRT-D implantation 08/22/2013 3. Morbid obesity 4. Medical noncompliance 5. OSA (noncompliant on CPAP) 6. Poorly controlled HTN with hypertensive heart disease 7. Hyperlipidemia 8. Type 2 diabetes mellitus with diabetic neuropathy 9. GERD  The patient's chest discomfort is fairly atypical. She does endorse tenderness over the device implantation site. She notes a vague chest "squeezing" occurring intermittently with position changes. This had been addressed last month and suspected to represent a sensation from LV pacing. Limited 2D echo indicated only a trivial pericardial effusion, otherwise no abnormalities. Cardiac catheterization in 05/2011 indicated normal coronaries. Will plan to observe overnight and check chest CT to eval for post-op device complications. If normal, plan discharge and follow-up with CHF clinic. Will cycle two additional troponins, however low suspicion for ACS. No evidence of decompensated CHF. Will resume outpatient meds.    Signed, R. Hurman Horn, PA-C 09/24/2013, 7:50 PM   Attending Note:   The patient was seen and examined.  Agree with assessment and plan as noted above.  Changes made to the above note as needed.  She has already seen Dr. Antoine Poche and Ulla Potash for these symptoms.  These pains may be due to pacing but the sensation seems to last longer.  She remains tender over her pacer -  more sensitive than I would expect.    She had a small pericardial effusion by  limited echo 2-3 weeks ago. Her symptoms may be due to a microperforation and worsening pericardial effusion.  I would like to get a CT chest to look for evidence for any worsening pericardial effusion and for any evidence of pacer infection.   Will get a D-dimer.  If the d-dimer is positive, I would make it a CT angio.  If the d-dimer is negative, I would order a CT chest with contrast.    She has not had any fevers .  She had normal coronaries in 2012 - I doubt this is due to CAD.  Would check 2 more troponin levels to verify this is not ACS.   If everything checks out OK, I think she can be discharged tomorrow.  Continue home meds.   Vesta Mixer, Montez Hageman., MD, [facility] 09/24/2013, 8:27 PM

## 2013-09-25 ENCOUNTER — Other Ambulatory Visit: Payer: Self-pay | Admitting: *Deleted

## 2013-09-25 ENCOUNTER — Ambulatory Visit: Payer: Medicaid Other | Admitting: Physical Medicine & Rehabilitation

## 2013-09-25 DIAGNOSIS — R072 Precordial pain: Secondary | ICD-10-CM | POA: Diagnosis present

## 2013-09-25 LAB — BASIC METABOLIC PANEL
CO2: 30 mEq/L (ref 19–32)
Calcium: 9.1 mg/dL (ref 8.4–10.5)
Chloride: 96 mEq/L (ref 96–112)
Creatinine, Ser: 0.86 mg/dL (ref 0.50–1.10)
GFR calc Af Amer: >90 mL/min (ref >90)
GFR calc non Af Amer: 79 mL/min — ABNORMAL LOW (ref >90)
Glucose, Bld: 269 mg/dL — ABNORMAL HIGH (ref 70–99)
Sodium: 135 mEq/L (ref 135–145)

## 2013-09-25 LAB — TROPONIN I: Troponin I: <0.3 ng/mL (ref <0.30)

## 2013-09-25 LAB — DIGOXIN LEVEL: Digoxin Level: <0.3 ng/mL — ABNORMAL LOW (ref 0.8–2.0)

## 2013-09-25 LAB — LIPID PANEL
Cholesterol: 183 mg/dL (ref 0–200)
HDL: 42 mg/dL (ref >39)
LDL Cholesterol: 108 mg/dL — ABNORMAL HIGH (ref 0–99)
Total CHOL/HDL Ratio: 4.4 RATIO
Triglycerides: 164 mg/dL — ABNORMAL HIGH (ref <150)
VLDL: 33 mg/dL (ref 0–40)

## 2013-09-25 MED ORDER — DIGOXIN 125 MCG PO TABS
0.1250 mg | ORAL_TABLET | Freq: Every day | ORAL | Status: DC
  Filled 2013-09-25: qty 1

## 2013-09-25 MED ORDER — HYDRALAZINE HCL 25 MG PO TABS: 37.5000 mg | ORAL_TABLET | Freq: Three times a day (TID) | ORAL | Status: DC

## 2013-09-25 MED ORDER — ISOSORBIDE MONONITRATE ER 60 MG PO TB24: 60.0000 mg | ORAL_TABLET | Freq: Every day | ORAL | Status: DC

## 2013-09-25 MED ORDER — DIGOXIN 125 MCG PO TABS: 0.1250 mg | ORAL_TABLET | Freq: Every day | ORAL | Status: DC

## 2013-09-25 MED ORDER — HYDRALAZINE HCL 25 MG PO TABS
37.5000 mg | ORAL_TABLET | Freq: Three times a day (TID) | ORAL | Status: DC
  Filled 2013-09-25 (×3): qty 1.5

## 2013-09-25 MED ORDER — SPIRONOLACTONE 25 MG PO TABS: 12.5000 mg | ORAL_TABLET | Freq: Every day | ORAL | Status: DC

## 2013-09-25 MED ORDER — ISOSORBIDE MONONITRATE ER 60 MG PO TB24
60.0000 mg | ORAL_TABLET | Freq: Every day | ORAL | Status: DC
  Filled 2013-09-25: qty 1

## 2013-09-25 NOTE — Progress Notes (Signed)
Pt did not want to take her morning meds until she was at home. Pt stable for discharge. Taken out in a wheelchair to private vehicle with family. Assessment unchanged from this am

## 2013-09-25 NOTE — Care Management Note (Signed)
    Page 1 of 1   09/25/2013     10:07:44 AM   CARE MANAGEMENT NOTE 09/25/2013  Patient:  [name], [name]   Account Number:  [id number]  Date Initiated:  09/25/2013  Documentation initiated by:  GRAVES-BIGELOW,Dreama Kuna  Subjective/Objective Assessment:   Pt admitted for cp and syncope. Pt is from home andshe is active with [facility] for [facility] medication management. RN to get resumption orders for services.     Action/Plan:   Pt states she is active with [facility]. CM will place call to Lyn [facility] Transitional CM to make her aware that pt is being d/c and her concerns that need addressed. No further needs from CM at this time.   Anticipated DC Date:  09/25/2013   Anticipated DC Plan:  HOME W HOME HEALTH SERVICES      DC Planning Services  CM consult      [facility] Choice  HOME HEALTH  Resumption Of Svcs/PTA Provider   Choice offered to / List presented to:  C-1 Patient        HH arranged  HH-1 RN  HH-10 DISEASE MANAGEMENT      Status of service:  Completed, signed off Medicare Important Message given?   (If response is "NO", the following Medicare IM given date fields will be blank) Date Medicare IM given:   Date Additional Medicare IM given:    Discharge Disposition:  HOME W HOME HEALTH SERVICES  Per UR Regulation:  Reviewed for med. necessity/level of care/duration of stay  If discussed at Long Length of Stay Meetings, dates discussed:    Comments:

## 2013-09-25 NOTE — ED Provider Notes (Signed)
Medical screening examination/treatment/procedure(s) were performed by non-physician practitioner and as supervising physician I was immediately available for consultation/collaboration.   Junius Argyle, MD 09/25/13 [phone]

## 2013-09-25 NOTE — Discharge Summary (Signed)
CARDIOLOGY DISCHARGE SUMMARY   Patient ID: ALLEAN MONTFORT MRN: [id number] DOB/AGE: 1966-07-06 47 y.o.  Admit date: 09/24/2013 Discharge date: 09/25/2013  Primary Discharge Diagnosis:    Precordial pain  Secondary Discharge Diagnosis:    Type 2 diabetes mellitus, uncontrolled, with neuropathy   Chronic combined systolic and diastolic heart failure  Procedures:  CTA chest  Hospital Course: ANISA LEANOS is a 47 y.o. female with a history of NICM and recent CRT-D implantation, discharged 08/24/2013. She reported intermittent tenderness over the ICD site. She had also fallen recently. She came to the emergency room with chest pain. She was admitted for further evaluation and treatment.  Her cardiac enzymes were negative for MI. Her ECG showed sinus rhythm with ventricular pacing. Her BNP was 718 and her chest x-ray showed cardiomegaly but no acute process. She was continued on her home diuretic and beta blocker but is not on an ACE inhibitor do to the angioedema. Her hydralazine and isosorbide were increased and the spironolactone was decreased for better heart failure control. Her blood sugars were controlled with a combination of her home insulin regimen and sliding scale.  Her d-dimer was elevated, so CT angiogram of the chest was ordered. There was concern for PE or postoperative device complications but no difficulties were noted. She was not felt to have decompensated CHF. There was concern that the symptoms were from ventricular pacing, but the duration was prolonged and not consistent with this.  She had no further chest pain overnight. On 09/25/2013, she was evaluated by Dr. Shirlee Latch in all data were reviewed. She had ruled out for MI and the CT angiogram showed no PE and no device-related complications. Her volume status was felt to be at baseline.  Labs:   Lab Results  Component Value Date   WBC 8.6 09/24/2013   HGB 12.1 09/24/2013   HCT 36.4 09/24/2013   MCV 83.7  09/24/2013   PLT 195 09/24/2013     Recent Labs Lab 09/24/13 1842 09/25/13 0430  NA 137 135  K 3.6 4.2  CL 98 96  CO2 26 30  BUN 8 10  CREATININE 0.72 0.86  CALCIUM 8.8 9.1  PROT 7.4  --   BILITOT 0.8  --   ALKPHOS 65  --   ALT 11  --   AST 13  --   GLUCOSE 125* 269*    Recent Labs  09/24/13 2330 09/25/13 0430  TROPONINI <0.30 <0.30   Lab Results  Component Value Date   DDIMER 1.49* 09/24/2013   Lipid Panel     Component Value Date/Time   CHOL 183 09/25/2013 0430   TRIG 164* 09/25/2013 0430   HDL 42 09/25/2013 0430   CHOLHDL 4.4 09/25/2013 0430   VLDL 33 09/25/2013 0430   LDLCALC 108* 09/25/2013 0430    Pro B Natriuretic peptide (BNP)  Date/Time Value Range Status  09/24/2013  6:42 PM 718.7* 0 - 125 pg/mL Final  07/09/2013  4:12 PM 230.0* 0.0 - 100.0 pg/mL Final    Recent Labs  09/24/13 1842  INR 1.02      Radiology: Dg Chest 2 View 09/24/2013   CLINICAL DATA:  Chest pain  EXAM: CHEST  2 VIEW  COMPARISON:  August 23, 2013  FINDINGS: Lungs are clear. Heart is mildly enlarged with normal pulmonary vascularity. Pacemaker leads are attached to the right atrium, right ventricle, and left ventricle. No adenopathy. No pneumothorax. No bone lesions.  IMPRESSION: Cardiomegaly. No edema or consolidation.  Electronically Signed   By: Bretta Bang M.D.   On: 09/24/2013 18:20   Ct Angio Chest Pe W/cm &/or Wo Cm 09/24/2013   CLINICAL DATA:  Positive D-dimer. Chest pain  EXAM: CT ANGIOGRAPHY CHEST WITH CONTRAST  TECHNIQUE: Multidetector CT imaging of the chest was performed using the standard protocol during bolus administration of intravenous contrast. Multiplanar CT image reconstructions including MIPs were obtained to evaluate the vascular anatomy.  CONTRAST:  82mL OMNIPAQUE IOHEXOL 350 MG/ML SOLN  COMPARISON:  02/24/2013  FINDINGS: THORACIC INLET/BODY WALL:  No acute abnormality.  MEDIASTINUM:  Chronic cardiomegaly. No pericardial effusion. Biventricular ICD/  pacer by left subclavian approach appears unremarkable. No evidence of central pulmonary embolism. Respiratory motion degrades evaluation beyond the lobar or proximal segmental pulmonary arteries. No acute aortic or great vessel abnormalities. No mediastinal adenopathy.  LUNG WINDOWS:  No consolidation.  No effusion.  No suspicious pulmonary nodule.  UPPER ABDOMEN:  No acute findings.  OSSEOUS:  No acute fracture.  No suspicious lytic or blastic lesions.  Review of the MIP images confirms the above findings.  IMPRESSION: 1. No evidence of pulmonary embolism. Sensitivity decreased by respiratory motion, limiting evaluation beyond the lobar or segmental pulmonary arteries (depending on level). 2. Cardiomegaly without failure.   Electronically Signed   By: Tiburcio Pea M.D.   On: 09/24/2013 22:15   EKG:  Sinus rhythm with biventricular pacing, heart rate 92   FOLLOW UP PLANS AND APPOINTMENTS Allergies  Allergen Reactions  . Clindamycin Anaphylaxis and Swelling    Swelling of pain  . Enalapril Maleate Swelling  . Lisinopril Swelling     Medication List         ACCU-CHEK NANO SMARTVIEW W/DEVICE Kit  1 each by Does not apply route 3 (three) times daily with meals.     albuterol 108 (90 BASE) MCG/ACT inhaler  Commonly known as:  PROVENTIL HFA;VENTOLIN HFA  Inhale 2 puffs into the lungs every 6 (six) hours as needed for wheezing or shortness of breath. For wheezing     aspirin EC 81 MG tablet  Take 81 mg by mouth every morning.     citalopram 20 MG tablet  Commonly known as:  CELEXA  Take 20 mg by mouth every morning.     cyclobenzaprine 10 MG tablet  Commonly known as:  FLEXERIL  Take 10 mg by mouth 3 (three) times daily as needed for muscle spasms.     diclofenac sodium 1 % Gel  Commonly known as:  VOLTAREN  Apply 2 g topically 4 (four) times daily as needed (for pain).     digoxin 0.125 MG tablet  Commonly known as:  LANOXIN  Take 1 tablet (0.125 mg total) by mouth daily.      furosemide 40 MG tablet  Commonly known as:  LASIX  Take 40 mg by mouth 2 (two) times daily.     glucose blood test strip  Commonly known as:  ACCU-CHEK SMARTVIEW  Use to check blood sugar 3 times per day     hydrALAZINE 25 MG tablet  Commonly known as:  APRESOLINE  Take 1.5 tablets (37.5 mg total) by mouth 3 (three) times daily.     ibuprofen 200 MG tablet  Commonly known as:  ADVIL,MOTRIN  Take 600 mg by mouth every 6 (six) hours as needed for pain.     insulin aspart 100 UNIT/ML injection  Commonly known as:  NOVOLOG FLEXPEN  Inject 5 Units into the skin 3 (three) times daily before  meals.     Insulin Glargine 100 UNIT/ML Sopn  Commonly known as:  LANTUS SOLOSTAR  Inject 16 Units into the skin at bedtime.     Insulin Pen Needle 31G X 5 MM Misc  Use to inject insulin 4 times a day. Dx code: 40.62.Insulin dependent.     isosorbide mononitrate 60 MG 24 hr tablet  Commonly known as:  IMDUR  Take 1 tablet (60 mg total) by mouth daily.     metoprolol succinate 50 MG 24 hr tablet  Commonly known as:  TOPROL-XL  Take 1 tablet (50 mg total) by mouth daily.     oxyCODONE-acetaminophen 10-325 MG per tablet  Commonly known as:  PERCOCET  Take 1 tablet by mouth every 8 (eight) hours as needed for pain. For pain     pantoprazole 40 MG tablet  Commonly known as:  PROTONIX  Take 40 mg by mouth daily.     ROLLATOR Misc  Use daily for ambulation     simvastatin 40 MG tablet  Commonly known as:  ZOCOR  Take 40 mg by mouth daily at 6 PM.     spironolactone 25 MG tablet  Commonly known as:  ALDACTONE  Take 0.5 tablets (12.5 mg total) by mouth daily.        Discharge Orders   Future Appointments Provider Department Dept Phone   10/03/2013 10:40 AM [facility] [phone]   10/16/2013 8:40 AM [facility] [phone]   11/23/2013 11:45 AM [name], MD  [facility] [place] Office [phone]   11/27/2013 9:20 AM Ranelle Oyster, MD [facility] [phone]   Future Orders Complete By Expires   (HEART FAILURE PATIENTS) Call MD:  Anytime you have any of the following symptoms: 1) 3 pound weight gain in 24 hours or 5 pounds in 1 week 2) shortness of breath, with or without a dry hacking cough 3) swelling in the hands, feet or stomach 4) if you have to sleep on extra pillows at night in order to breathe.  As directed    Diet - low sodium heart healthy  As directed    Diet Carb Modified  As directed    Increase activity slowly  As directed      Follow-up Information   Follow up with Marca Ancona, MD On 10/03/2013. (@ 10:40 am; Gate code 0200)    Specialty:  Cardiology   Contact information:   [address] [place] [address] [phone]       BRING ALL MEDICATIONS WITH YOU TO FOLLOW UP APPOINTMENTS  Time spent with patient to include physician time: 38 min Signed: Theodore Demark, PA-C 09/25/2013, 9:23 AM Co-Sign MD

## 2013-09-25 NOTE — Progress Notes (Signed)
Brief Cross Cover Note  I followed up elevated d-dimer with CTA chest to evaluate for PE. CTA negative for large PE but some limitation due to movement.   Leeann Must, MD

## 2013-09-25 NOTE — Progress Notes (Signed)
Patient ID: [name], female   DOB: 1966/02/07, 47 y.o.   MRN: [id number]    SUBJECTIVE: No further chest pain this morning.  CT last night showed no PE and no device-related complications.  She ruled out for MI.   [name] aspirin  324 mg Oral Once  . aspirin EC  81 mg Oral q morning - 10a  . citalopram  20 mg Oral q morning - 10a  . digoxin  0.125 mg Oral Daily  . furosemide  40 mg Oral BID  . heparin  5,000 Units Subcutaneous Q8H  . hydrALAZINE  25 mg Oral TID  . insulin aspart  5 Units Subcutaneous TID AC  . insulin glargine  16 Units Subcutaneous QHS  . isosorbide mononitrate  30 mg Oral Daily  . metoprolol succinate  50 mg Oral Daily  . pantoprazole  40 mg Oral Daily  . simvastatin  40 mg Oral q1800  . sodium chloride  3 mL Intravenous Q12H  . spironolactone  12.5 mg Oral Daily      Filed Vitals:   09/24/13 1845 09/24/13 2123 09/24/13 2226 09/25/13 0526  BP:  132/88 140/82 127/94  Pulse:  84 92 91  Temp:  98.8 F (37.1 C) 98.3 F (36.8 C) 97.6 F (36.4 C)  TempSrc:  Oral Oral Oral  Resp:  18  18  Height:      Weight: 278 lb 4.8 oz (126.236 kg)   282 lb (127.914 kg)  SpO2:  99% 97% 100%   No intake or output data in the 24 hours ending 09/25/13 0759  LABS: Basic Metabolic Panel:  Recent Labs  40/98/11 1842 09/25/13 0430  NA 137 135  K 3.6 4.2  CL 98 96  CO2 26 30  GLUCOSE 125* 269*  BUN 8 10  CREATININE 0.72 0.86  CALCIUM 8.8 9.1   Liver Function Tests:  Recent Labs  09/24/13 1842  AST 13  ALT 11  ALKPHOS 65  BILITOT 0.8  PROT 7.4  ALBUMIN 3.9   No results found for this basename: LIPASE, AMYLASE,  in the last 72 hours CBC:  Recent Labs  09/24/13 1842  WBC 8.6  HGB 12.1  HCT 36.4  MCV 83.7  PLT 195   Cardiac Enzymes:  Recent Labs  09/24/13 2330 09/25/13 0430  TROPONINI <0.30 <0.30   BNP: No components found with this basename: POCBNP,  D-Dimer:  Recent Labs  09/24/13 1958  DDIMER 1.49*   Hemoglobin A1C: No results  found for this basename: HGBA1C,  in the last 72 hours Fasting Lipid Panel:  Recent Labs  09/25/13 0430  CHOL 183  HDL 42  LDLCALC 108*  TRIG 164*  CHOLHDL 4.4   Thyroid Function Tests: No results found for this basename: TSH, T4TOTAL, FREET3, T3FREE, THYROIDAB,  in the last 72 hours Anemia Panel: No results found for this basename: VITAMINB12, FOLATE, FERRITIN, TIBC, IRON, RETICCTPCT,  in the last 72 hours  RADIOLOGY: Dg Chest 2 View  09/24/2013   CLINICAL DATA:  Chest pain  EXAM: CHEST  2 VIEW  COMPARISON:  August 23, 2013  FINDINGS: Lungs are clear. Heart is mildly enlarged with normal pulmonary vascularity. Pacemaker leads are attached to the right atrium, right ventricle, and left ventricle. No adenopathy. No pneumothorax. No bone lesions.  IMPRESSION: Cardiomegaly. No edema or consolidation.   Electronically Signed   By: Bretta Bang M.D.   On: 09/24/2013 18:20   Ct Angio Chest Pe W/cm &/or Wo Cm  09/24/2013   CLINICAL DATA:  Positive D-dimer. Chest pain  EXAM: CT ANGIOGRAPHY CHEST WITH CONTRAST  TECHNIQUE: Multidetector CT imaging of the chest was performed using the standard protocol during bolus administration of intravenous contrast. Multiplanar CT image reconstructions including MIPs were obtained to evaluate the vascular anatomy.  CONTRAST:  82mL OMNIPAQUE IOHEXOL 350 MG/ML SOLN  COMPARISON:  02/24/2013  FINDINGS: THORACIC INLET/BODY WALL:  No acute abnormality.  MEDIASTINUM:  Chronic cardiomegaly. No pericardial effusion. Biventricular ICD/ pacer by left subclavian approach appears unremarkable. No evidence of central pulmonary embolism. Respiratory motion degrades evaluation beyond the lobar or proximal segmental pulmonary arteries. No acute aortic or great vessel abnormalities. No mediastinal adenopathy.  LUNG WINDOWS:  No consolidation.  No effusion.  No suspicious pulmonary nodule.  UPPER ABDOMEN:  No acute findings.  OSSEOUS:  No acute fracture.  No suspicious lytic  or blastic lesions.  Review of the MIP images confirms the above findings.  IMPRESSION: 1. No evidence of pulmonary embolism. Sensitivity decreased by respiratory motion, limiting evaluation beyond the lobar or segmental pulmonary arteries (depending on level). 2. Cardiomegaly without failure.   Electronically Signed   By: Tiburcio Pea M.D.   On: 09/24/2013 22:15   US Soft Tissue Head/neck  09/06/2013   CLINICAL DATA:  Goiter.  EXAM: THYROID ULTRASOUND  TECHNIQUE: Ultrasound examination of the thyroid gland and adjacent soft tissues was performed.  COMPARISON:  None available  FINDINGS: Right thyroid lobe  Measurements: 54 x 18 x 21 mm.  No nodules visualized.  Left thyroid lobe  Measurements: 46 x 9 x 13 mm. 3 x 4 x 4 mm hypoechoic/cystic lesion, inferior pole  Isthmus  Thickness: 2 mm.  No nodules visualized.  Lymphadenopathy  None visualized.  IMPRESSION: Normal-sized thyroid with a single small left cystic lesion. Findings do not meet current consensus criteria for biopsy. Follow-up by clinical exam is recommended. If patient has known risk factors for thyroid carcinoma, consider follow-up ultrasound in 12 months. If patient is clinically hyperthyroid, consider nuclear medicine thyroid uptake and scan. This recommendation follows the consensus statement: Management of Thyroid Nodules Detected as [place]: Society of Radiologists in Ultrasound Consensus Conference Statement. Radiology 2005; [id number].   Electronically Signed   By: Oley Balm M.D.   On: 09/06/2013 14:04    PHYSICAL EXAM General: NAD Neck: Thick, no JVD, no thyromegaly or thyroid nodule.  Lungs: Clear to auscultation bilaterally with normal respiratory effort. CV: Nondisplaced PMI.  Heart regular S1/S2, no S3/S4, no murmur.  No peripheral edema.  No carotid bruit.  Normal pedal pulses.  Abdomen: Soft, nontender, no hepatosplenomegaly, no distention.  Neurologic: Alert and oriented x 3.  Psych: Normal affect. Extremities: No  clubbing or cyanosis.  PCM site benign  TELEMETRY: Reviewed telemetry pt in NSR with BiV pacing  ASSESSMENT AND PLAN: 47 yo with nonischemic CMP, HTN, and recent CRT-D implantation presented with atypical chest pain.  1. Cardiomyopathy: Nonischemic.  She does not look volume overloaded on exam.  Recent CRT-D implantation.   - Continue Toprol XL - Change digoxin to 0.125 mg daily.  - Increase hydralazine to 37.5 mg tid and Imdur to 60. - Angioedema with ACEI.  2. Chest pain: Atypical.  CEs negative.  Chest CT negative for PE.  No pericardial effusion or PCM site complication.   3. Disposition: OK for home this morning.  Needs followup with me in CHF clinic.  Needs home health followup.  Meds: ASA 81 daily, hydralazine 37.5 mg tid, Imdur 60 daily,  Lasix 40 mg bid, Toprol XL 50 daily, home statin, spironolactone 12.5 mg daily.   Marca Ancona 09/25/2013 8:04 AM

## 2013-09-26 ENCOUNTER — Telehealth: Payer: Self-pay

## 2013-09-26 MED ORDER — ACCU-CHEK FASTCLIX LANCETS MISC: 1.0000 | Freq: Three times a day (TID) | Status: DC

## 2013-09-26 NOTE — Telephone Encounter (Signed)
Patient called requesting medication refill.  Percocet was last written 09/03/13.  Advised patient refill was to early and she would need to call back next week.

## 2013-09-27 ENCOUNTER — Encounter (HOSPITAL_COMMUNITY): Payer: Medicaid Other

## 2013-09-27 ENCOUNTER — Other Ambulatory Visit (HOSPITAL_COMMUNITY): Payer: Self-pay | Admitting: Adult Health

## 2013-09-27 MED ORDER — METOPROLOL SUCCINATE ER 50 MG PO TB24: 50.0000 mg | ORAL_TABLET | Freq: Every day | ORAL | Status: DC

## 2013-10-01 ENCOUNTER — Telehealth: Payer: Self-pay

## 2013-10-01 DIAGNOSIS — M1712 Unilateral primary osteoarthritis, left knee: Secondary | ICD-10-CM

## 2013-10-01 DIAGNOSIS — M47816 Spondylosis without myelopathy or radiculopathy, lumbar region: Secondary | ICD-10-CM

## 2013-10-01 MED ORDER — OXYCODONE-ACETAMINOPHEN 10-325 MG PO TABS: 1.0000 | ORAL_TABLET | Freq: Three times a day (TID) | ORAL | Status: DC | PRN

## 2013-10-01 NOTE — Telephone Encounter (Signed)
Patient called to get medication refill.  Rx printed.  Left message advising patient it would be ready for pick up Tuesday after 2.

## 2013-10-02 ENCOUNTER — Encounter: Payer: Medicaid Other | Admitting: Cardiology

## 2013-10-03 ENCOUNTER — Ambulatory Visit (HOSPITAL_COMMUNITY)
Admit: 2013-10-03 | Discharge: 2013-10-03 | Disposition: A | Discharge status: Home / Self Care | Payer: Medicaid Other | Source: Ambulatory Visit | Attending: Internal Medicine | Admitting: Internal Medicine

## 2013-10-03 VITALS — BP 142/102 | HR 100 | Wt 279.0 lb

## 2013-10-03 DIAGNOSIS — G4733 Obstructive sleep apnea (adult) (pediatric): Secondary | ICD-10-CM

## 2013-10-03 DIAGNOSIS — E785 Hyperlipidemia, unspecified: Secondary | ICD-10-CM | POA: Insufficient documentation

## 2013-10-03 DIAGNOSIS — E1140 Type 2 diabetes mellitus with diabetic neuropathy, unspecified: Secondary | ICD-10-CM

## 2013-10-03 DIAGNOSIS — E1149 Type 2 diabetes mellitus with other diabetic neurological complication: Secondary | ICD-10-CM

## 2013-10-03 DIAGNOSIS — I5042 Chronic combined systolic (congestive) and diastolic (congestive) heart failure: Secondary | ICD-10-CM

## 2013-10-03 DIAGNOSIS — I10 Essential (primary) hypertension: Secondary | ICD-10-CM | POA: Insufficient documentation

## 2013-10-03 DIAGNOSIS — E1142 Type 2 diabetes mellitus with diabetic polyneuropathy: Secondary | ICD-10-CM

## 2013-10-03 LAB — BASIC METABOLIC PANEL
BUN: 9 mg/dL (ref 6–23)
Calcium: 9.3 mg/dL (ref 8.4–10.5)
Chloride: 97 mEq/L (ref 96–112)
Creatinine, Ser: 0.75 mg/dL (ref 0.50–1.10)
GFR calc Af Amer: >90 mL/min (ref >90)
GFR calc non Af Amer: >90 mL/min (ref >90)
Glucose, Bld: 204 mg/dL — ABNORMAL HIGH (ref 70–99)

## 2013-10-03 NOTE — Patient Instructions (Signed)
Follow up in 1 month with Dr Shirlee Latch  Do the following things EVERYDAY: 1) Weigh yourself in the morning before breakfast. Write it down and keep it in a log. 2) Take your medicines as prescribed 3) Eat low salt foods-Limit salt (sodium) to 2000 mg per day.  4) Stay as active as you can everyday 5) Limit all fluids for the day to less than 2 liters

## 2013-10-03 NOTE — Progress Notes (Signed)
Patient ID: [name], female   DOB: Aug 05, 1966, 47 y.o.   MRN: [id number]  Weight Range  282 pounds  Baseline proBNP   EP: Dr Graciela Husbands PCP: Dr Yetta Barre or Dr. Lovenia Kim internal medicine  --[employer]--  HPI: 47 yo with history of morbid obesity, DM2, OSA on CPAP and combined CHF in the setting of poorly-controlled HTN. She was admitted in 05/2011 with chest pain and new LBBB and ended up getting a left heart cath, which showed no angiographic CAD. Renal artery dopplers were normal in 04/2012.   Was hospitalized May 2014 with acute on chronic combined systolic and diastolic CHF. EF from December 2013 was down to 40 to 45%.    Admitted to the hospital 10/13-10/14 for CP. CT of chest negative for PE. Her CEs were negative and hydralazine and IMDUR were increased to 37.5 mg TID and 60 mg daily.   She returns for post hospital follow up with all medications. She did not take any medications this am because she usually start am medicines between 9 and 12:00. Just received Toprol XL in mail today.  Mild dyspnea with exertion. + orthopnea sleeps in an upright position. Mild dizziness. Denies PND. Scales are not working at home. Last weight at home was 273 pounds. She has started walking on tread mill. Tries to follow low salt diet and limit fluid intake to < 2 liters per day. Followed by [facility].    ECHO 11/2012: 40-45% ECHO  07/2013: 20% RV normal ECHO    08/23/2013: EF 40-45%, diff HK  ECHO   09/05/2013: EF 25-30%, diff HK   Labs 06/2013: proBNP 230, Cr 0.9, K 3.7          08/2013: K+ 3.9, Cr 0.9       09/2013: K 4.2, Cr 0.86, pro-BNP 718, dig level <0.3  ROS: All systems negative except as listed in HPI, PMH and Problem List.  Past Medical History  Diagnosis Date  . Hypertension     Poorly controlled. Has had HTN since age 5. Angioedema with ACEI.  24 Hr urine and renal arterial dopplers ordered . . . Never done  . Type II or unspecified type diabetes mellitus without mention of complication,  uncontrolled DX: 2002  . NICM (nonischemic cardiomyopathy)     EF 45-50% in 8/12, cath 6/12 showed normal coronaries, EF 50-55% by LV gram  . Morbid obesity   . Allergic rhinitis   . HLD (hyperlipidemia)   . OSA on CPAP     sleep study in 8/12 showed moderate to severe OSA requiring CPAP  . Chronic lower back pain     secondary to DJD, obsetiy, hip problems. Followed by Dr. Ivory Broad (pain management)  . 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 Chronic diastolic heart failure      Primarily diastolic CHF: Likely due to uncontrolled HTN. Last echo (8/12) with EF 45-50%, mild to moderate LVH with some asymmetric septal hypertrophy, RV normal size and systolic function. EF 50-55% by LV-gram in 6/12.   . Degeneration of lumbar or lumbosacral intervertebral disc   . Polyneuropathy in diabetes(357.2)   . Thoracic or lumbosacral neuritis or radiculitis, unspecified   . Calcifying tendinitis of shoulder   . LBBB (left bundle branch block)   . Chronic combined systolic and diastolic CHF (congestive heart failure)     EF 40-45% by echo 12/06/2012  . Coronary artery disease     questionable. LHC 05/2011  showing normal coronaries // Followed at [facility], Dr. Shirlee Latch  . GERD (gastroesophageal reflux disease)     Current Outpatient Prescriptions  Medication Sig Dispense Refill  . ACCU-CHEK FASTCLIX LANCETS MISC 1 each by Does not apply route 3 (three) times daily. USE UP TO 3 TIMES DAILY TO CHECK BLOOD SUGAR. DIAG CODE 250.00. INSULIN DEPENDENT  102 each  6  . albuterol (PROVENTIL HFA;VENTOLIN HFA) 108 (90 BASE) MCG/ACT inhaler Inhale 2 puffs into the lungs every 6 (six) hours as needed for wheezing or shortness of breath. For wheezing      . Blood Glucose Monitoring Suppl (ACCU-CHEK NANO SMARTVIEW) W/DEVICE KIT 1 each by Does not apply route 3 (three) times daily with meals.  1 kit  0  . citalopram (CELEXA) 20 MG tablet Take 20 mg by mouth every morning.      .  cyclobenzaprine (FLEXERIL) 10 MG tablet Take 10 mg by mouth 3 (three) times daily as needed for muscle spasms.      . diclofenac sodium (VOLTAREN) 1 % GEL Apply 2 g topically 4 (four) times daily as needed (for pain).      . furosemide (LASIX) 40 MG tablet Take 40 mg by mouth 2 (two) times daily.      [name] glucose blood (ACCU-CHEK SMARTVIEW) test strip Use to check blood sugar 3 times per day  100 each  12  . hydrALAZINE (APRESOLINE) 25 MG tablet Take 1.5 tablets (37.5 mg total) by mouth 3 (three) times daily.  140 tablet  11  . insulin aspart (NOVOLOG FLEXPEN) 100 UNIT/ML injection Inject 5 Units into the skin 3 (three) times daily before meals.  1 vial  11  . Insulin Glargine (LANTUS SOLOSTAR) 100 UNIT/ML SOPN Inject 16 Units into the skin at bedtime.  5 pen  11  . Insulin Pen Needle 31G X 5 MM MISC Use to inject insulin 4 times a day. Dx code: 74.62.Insulin dependent.  120 each  11  . isosorbide mononitrate (IMDUR) 60 MG 24 hr tablet Take 1 tablet (60 mg total) by mouth daily.  30 tablet  11  . metoprolol succinate (TOPROL-XL) 50 MG 24 hr tablet Take 1 tablet (50 mg total) by mouth daily.  30 tablet  3  . Misc. Devices (ROLLATOR) MISC Use daily for ambulation  1 each  0  . oxyCODONE-acetaminophen (PERCOCET) 10-325 MG per tablet Take 1 tablet by mouth every 8 (eight) hours as needed for pain. For pain  70 tablet  0  . pantoprazole (PROTONIX) 40 MG tablet Take 40 mg by mouth daily.      . simvastatin (ZOCOR) 40 MG tablet Take 40 mg by mouth daily at 6 PM.      . spironolactone (ALDACTONE) 25 MG tablet Take 0.5 tablets (12.5 mg total) by mouth daily.  90 tablet  3  . aspirin EC 81 MG tablet Take 81 mg by mouth every morning.      . digoxin (LANOXIN) 0.125 MG tablet Take 1 tablet (0.125 mg total) by mouth daily.  30 tablet  11  . ibuprofen (ADVIL,MOTRIN) 200 MG tablet Take 600 mg by mouth every 6 (six) hours as needed for pain.       No current facility-administered medications for this encounter.     Filed Vitals:   10/03/13 1054  BP: 142/102  Pulse: 100  Weight: 279 lb (126.554 kg)  SpO2: 99%   PHYSICAL EXAM: General:  NAD. No resp difficulty HEENT: normal Neck: supple. Thick. Hard  to see JVP, but does not appear elevated. Carotids 2+ bilaterally; no bruits. No lymphadenopathy appreciated.  Cor: PMI nonpalpable Regular rate & rhythm. No rubs, gallops or murmurs. Split s2 Lungs: clear Abdomen: obese soft, nontender, nondistended. No hepatosplenomegaly. No bruits or masses. Good bowel sounds. Extremities: no cyanosis, clubbing, rash, edema Neuro: alert & orientedx3, cranial nerves grossly intact. Moves all 4 extremities w/o difficulty. Affect pleasant.   ASSESSMENT:  1. Chronic systolic HF, NICM likely r/t HTN, EF 25-30% (08/2013) s/p CRT-D-Medtronic - Ms. Ticer's EF is decreased from 08/23/13 to 09/05/13. She had a CRT-D placed in early September. NYHA IIIb.  Volume status stable. Continue lasix 40 mg twice a day and 12.5 mg spironolactone.  - Continue Toprol XL 50 mg daily -she just received in the mail today therefore will not up titrate. Continue digoxin 0.125 mg daily.  -Continue hydralazine 36.5 mg tid and 60 mg Imdur daily -  She is not on ACE-I d/t angioedema - Provided pill box . Continue [facility] for tele-monitoring and assistance with medications.  - Reinforced the need and importance of daily weights, a low sodium diet, and fluid restriction (less than 2 L a day). Instructed to call the HF clinic if weight increases more than 3 lbs overnight or 5 lbs in a week.  Check BMET 2. Uncontrolled HTN- Did not take medications this am. Continue current regimen  - 3. Morbid obesity - Encouraged weight loss with portion control 4. OSA on CPAP - Uses  CPAP nightly.  5. HLD- 09/2013 Cholesterol 183 Triglycerides 164 HDL 42 LDL 108 . She has restarted statin.    Follow up in 1 month with Dr Greig Castilla NP-C 11:10 AM

## 2013-10-04 ENCOUNTER — Ambulatory Visit: Payer: Medicaid Other | Admitting: Cardiology

## 2013-10-06 ENCOUNTER — Observation Stay (HOSPITAL_COMMUNITY)
Admission: EM | Admit: 2013-10-06 | Discharge: 2013-10-07 | Disposition: A | Discharge status: Home / Self Care | Payer: Medicaid Other | Attending: Infectious Diseases | Admitting: Infectious Diseases

## 2013-10-06 ENCOUNTER — Emergency Department (HOSPITAL_COMMUNITY): Payer: Medicaid Other

## 2013-10-06 DIAGNOSIS — E1142 Type 2 diabetes mellitus with diabetic polyneuropathy: Secondary | ICD-10-CM | POA: Insufficient documentation

## 2013-10-06 DIAGNOSIS — Z888 Allergy status to other drugs, medicaments and biological substances status: Secondary | ICD-10-CM | POA: Insufficient documentation

## 2013-10-06 DIAGNOSIS — M5137 Other intervertebral disc degeneration, lumbosacral region: Secondary | ICD-10-CM | POA: Insufficient documentation

## 2013-10-06 DIAGNOSIS — R519 Headache, unspecified: Secondary | ICD-10-CM | POA: Diagnosis present

## 2013-10-06 DIAGNOSIS — E669 Obesity, unspecified: Secondary | ICD-10-CM

## 2013-10-06 DIAGNOSIS — R7309 Other abnormal glucose: Secondary | ICD-10-CM

## 2013-10-06 DIAGNOSIS — I10 Essential (primary) hypertension: Secondary | ICD-10-CM | POA: Insufficient documentation

## 2013-10-06 DIAGNOSIS — M1610 Unilateral primary osteoarthritis, unspecified hip: Secondary | ICD-10-CM | POA: Insufficient documentation

## 2013-10-06 DIAGNOSIS — E1149 Type 2 diabetes mellitus with other diabetic neurological complication: Secondary | ICD-10-CM

## 2013-10-06 DIAGNOSIS — E785 Hyperlipidemia, unspecified: Secondary | ICD-10-CM | POA: Insufficient documentation

## 2013-10-06 DIAGNOSIS — M169 Osteoarthritis of hip, unspecified: Secondary | ICD-10-CM | POA: Insufficient documentation

## 2013-10-06 DIAGNOSIS — Z79899 Other long term (current) drug therapy: Secondary | ICD-10-CM | POA: Insufficient documentation

## 2013-10-06 DIAGNOSIS — Z95810 Presence of automatic (implantable) cardiac defibrillator: Secondary | ICD-10-CM | POA: Insufficient documentation

## 2013-10-06 DIAGNOSIS — K219 Gastro-esophageal reflux disease without esophagitis: Secondary | ICD-10-CM | POA: Insufficient documentation

## 2013-10-06 DIAGNOSIS — R739 Hyperglycemia, unspecified: Secondary | ICD-10-CM

## 2013-10-06 DIAGNOSIS — Z7982 Long term (current) use of aspirin: Secondary | ICD-10-CM | POA: Insufficient documentation

## 2013-10-06 DIAGNOSIS — Z950 Presence of cardiac pacemaker: Secondary | ICD-10-CM | POA: Insufficient documentation

## 2013-10-06 DIAGNOSIS — Z794 Long term (current) use of insulin: Secondary | ICD-10-CM | POA: Insufficient documentation

## 2013-10-06 DIAGNOSIS — IMO0002 Reserved for concepts with insufficient information to code with codable children: Secondary | ICD-10-CM | POA: Insufficient documentation

## 2013-10-06 DIAGNOSIS — R9431 Abnormal electrocardiogram [ECG] [EKG]: Secondary | ICD-10-CM

## 2013-10-06 DIAGNOSIS — I447 Left bundle-branch block, unspecified: Secondary | ICD-10-CM | POA: Insufficient documentation

## 2013-10-06 DIAGNOSIS — R4182 Altered mental status, unspecified: Principal | ICD-10-CM | POA: Insufficient documentation

## 2013-10-06 DIAGNOSIS — R4701 Aphasia: Secondary | ICD-10-CM | POA: Diagnosis present

## 2013-10-06 DIAGNOSIS — G4733 Obstructive sleep apnea (adult) (pediatric): Secondary | ICD-10-CM | POA: Insufficient documentation

## 2013-10-06 DIAGNOSIS — I5042 Chronic combined systolic (congestive) and diastolic (congestive) heart failure: Secondary | ICD-10-CM | POA: Insufficient documentation

## 2013-10-06 DIAGNOSIS — R51 Headache: Secondary | ICD-10-CM | POA: Diagnosis present

## 2013-10-06 DIAGNOSIS — M7530 Calcific tendinitis of unspecified shoulder: Secondary | ICD-10-CM | POA: Insufficient documentation

## 2013-10-06 DIAGNOSIS — I428 Other cardiomyopathies: Secondary | ICD-10-CM | POA: Insufficient documentation

## 2013-10-06 DIAGNOSIS — Z3202 Encounter for pregnancy test, result negative: Secondary | ICD-10-CM | POA: Insufficient documentation

## 2013-10-06 DIAGNOSIS — E1140 Type 2 diabetes mellitus with diabetic neuropathy, unspecified: Secondary | ICD-10-CM | POA: Diagnosis present

## 2013-10-06 DIAGNOSIS — M51379 Other intervertebral disc degeneration, lumbosacral region without mention of lumbar back pain or lower extremity pain: Secondary | ICD-10-CM | POA: Insufficient documentation

## 2013-10-06 DIAGNOSIS — IMO0001 Reserved for inherently not codable concepts without codable children: Secondary | ICD-10-CM | POA: Insufficient documentation

## 2013-10-06 LAB — TROPONIN I
Troponin I: <0.3 ng/mL (ref <0.30)
Troponin I: <0.3 ng/mL (ref <0.30)

## 2013-10-06 LAB — DIFFERENTIAL
Basophils Absolute: 0 10*3/uL (ref 0.0–0.1)
Basophils Relative: 1 % (ref 0–1)
Eosinophils Absolute: 0.1 10*3/uL (ref 0.0–0.7)
Eosinophils Relative: 1 % (ref 0–5)
Lymphs Abs: 2.8 10*3/uL (ref 0.7–4.0)
Monocytes Absolute: 0.6 10*3/uL (ref 0.1–1.0)
Monocytes Relative: 7 % (ref 3–12)
Neutro Abs: 4.9 10*3/uL (ref 1.7–7.7)
Neutrophils Relative %: 58 % (ref 43–77)

## 2013-10-06 LAB — URINE MICROSCOPIC-ADD ON

## 2013-10-06 LAB — POCT I-STAT, CHEM 8
BUN: 10 mg/dL (ref 6–23)
Creatinine, Ser: 0.8 mg/dL (ref 0.50–1.10)
Glucose, Bld: 375 mg/dL — ABNORMAL HIGH (ref 70–99)
Hemoglobin: 13.6 g/dL (ref 12.0–15.0)
Potassium: 3.5 mEq/L (ref 3.5–5.1)
Sodium: 136 mEq/L (ref 135–145)
TCO2: 21 mmol/L (ref 0–100)

## 2013-10-06 LAB — CBC
HCT: 37.6 % (ref 36.0–46.0)
Hemoglobin: 12.3 g/dL (ref 12.0–15.0)
MCH: 28.1 pg (ref 26.0–34.0)
MCHC: 32.7 g/dL (ref 30.0–36.0)
MCV: 85.8 fL (ref 78.0–100.0)
RDW: 12.9 % (ref 11.5–15.5)

## 2013-10-06 LAB — URINALYSIS, ROUTINE W REFLEX MICROSCOPIC
Bilirubin Urine: NEGATIVE
Glucose, UA: >1000 mg/dL — AB
Hgb urine dipstick: NEGATIVE
Ketones, ur: NEGATIVE mg/dL
Protein, ur: NEGATIVE mg/dL
Specific Gravity, Urine: 1.043 — ABNORMAL HIGH (ref 1.005–1.030)
Urobilinogen, UA: 1 mg/dL (ref 0.0–1.0)
pH: 6.5 (ref 5.0–8.0)

## 2013-10-06 LAB — COMPREHENSIVE METABOLIC PANEL
AST: 13 U/L (ref 0–37)
Albumin: 3.7 g/dL (ref 3.5–5.2)
BUN: 11 mg/dL (ref 6–23)
CO2: 20 mEq/L (ref 19–32)
Calcium: 8.7 mg/dL (ref 8.4–10.5)
Creatinine, Ser: 0.67 mg/dL (ref 0.50–1.10)
GFR calc non Af Amer: >90 mL/min (ref >90)
Glucose, Bld: 364 mg/dL — ABNORMAL HIGH (ref 70–99)
Potassium: 3.9 mEq/L (ref 3.5–5.1)
Total Bilirubin: 0.8 mg/dL (ref 0.3–1.2)

## 2013-10-06 LAB — RAPID URINE DRUG SCREEN, HOSP PERFORMED
Amphetamines: NOT DETECTED
Cocaine: NOT DETECTED
Opiates: NOT DETECTED
Tetrahydrocannabinol: NOT DETECTED

## 2013-10-06 LAB — ETHANOL: Alcohol, Ethyl (B): <11 mg/dL (ref 0–11)

## 2013-10-06 LAB — GLUCOSE, CAPILLARY: Glucose-Capillary: 294 mg/dL — ABNORMAL HIGH (ref 70–99)

## 2013-10-06 LAB — POCT I-STAT TROPONIN I

## 2013-10-06 LAB — APTT: aPTT: 31 seconds (ref 24–37)

## 2013-10-06 LAB — PREGNANCY, URINE: Preg Test, Ur: NEGATIVE

## 2013-10-06 MED ORDER — DIGOXIN 125 MCG PO TABS
0.1250 mg | ORAL_TABLET | Freq: Every day | ORAL | Status: DC
  Administered 2013-10-07: 0.125 mg via ORAL
  Filled 2013-10-06 (×2): qty 1

## 2013-10-06 MED ORDER — ASPIRIN 81 MG PO CHEW
324.0000 mg | CHEWABLE_TABLET | Freq: Once | ORAL | Status: AC
  Administered 2013-10-06: 324 mg via ORAL
  Filled 2013-10-06: qty 4

## 2013-10-06 MED ORDER — IOHEXOL 350 MG/ML SOLN
100.0000 mL | Freq: Once | INTRAVENOUS | Status: AC | PRN
  Administered 2013-10-06: 100 mL via INTRAVENOUS

## 2013-10-06 MED ORDER — CITALOPRAM HYDROBROMIDE 20 MG PO TABS
20.0000 mg | ORAL_TABLET | Freq: Every day | ORAL | Status: DC
  Administered 2013-10-07: 20 mg via ORAL
  Filled 2013-10-06 (×2): qty 1

## 2013-10-06 MED ORDER — METOPROLOL SUCCINATE ER 50 MG PO TB24
50.0000 mg | ORAL_TABLET | Freq: Every day | ORAL | Status: DC
  Administered 2013-10-07: 50 mg via ORAL
  Filled 2013-10-06: qty 1

## 2013-10-06 MED ORDER — ASPIRIN EC 81 MG PO TBEC
81.0000 mg | DELAYED_RELEASE_TABLET | Freq: Every day | ORAL | Status: DC
  Administered 2013-10-07: 81 mg via ORAL
  Filled 2013-10-06 (×2): qty 1

## 2013-10-06 MED ORDER — SIMVASTATIN 40 MG PO TABS
40.0000 mg | ORAL_TABLET | Freq: Every day | ORAL | Status: DC
  Administered 2013-10-07: 40 mg via ORAL
  Filled 2013-10-06: qty 1

## 2013-10-06 MED ORDER — ALBUTEROL SULFATE HFA 108 (90 BASE) MCG/ACT IN AERS
2.0000 | INHALATION_SPRAY | Freq: Four times a day (QID) | RESPIRATORY_TRACT | Status: DC | PRN
  Filled 2013-10-06: qty 6.7

## 2013-10-06 MED ORDER — INSULIN ASPART 100 UNIT/ML ~~LOC~~ SOLN
0.0000 [IU] | Freq: Three times a day (TID) | SUBCUTANEOUS | Status: DC
  Administered 2013-10-07: 3 [IU] via SUBCUTANEOUS

## 2013-10-06 MED ORDER — ENOXAPARIN SODIUM 60 MG/0.6ML ~~LOC~~ SOLN
60.0000 mg | SUBCUTANEOUS | Status: DC
  Administered 2013-10-06: 60 mg via SUBCUTANEOUS
  Filled 2013-10-06 (×2): qty 0.6

## 2013-10-06 MED ORDER — SODIUM CHLORIDE 0.9 % IV BOLUS (SEPSIS)
2000.0000 mL | Freq: Once | INTRAVENOUS | Status: AC
  Administered 2013-10-06: 2000 mL via INTRAVENOUS

## 2013-10-06 MED ORDER — SPIRONOLACTONE 12.5 MG HALF TABLET
12.5000 mg | ORAL_TABLET | Freq: Every day | ORAL | Status: DC
  Administered 2013-10-07: 12.5 mg via ORAL
  Filled 2013-10-06 (×2): qty 1

## 2013-10-06 MED ORDER — FUROSEMIDE 40 MG PO TABS
40.0000 mg | ORAL_TABLET | Freq: Two times a day (BID) | ORAL | Status: DC
  Administered 2013-10-07: 40 mg via ORAL
  Filled 2013-10-06 (×3): qty 1

## 2013-10-06 MED ORDER — INSULIN ASPART 100 UNIT/ML ~~LOC~~ SOLN
8.0000 [IU] | Freq: Once | SUBCUTANEOUS | Status: AC
  Administered 2013-10-06: 8 [IU] via SUBCUTANEOUS
  Filled 2013-10-06: qty 1

## 2013-10-06 MED ORDER — HYDRALAZINE HCL 25 MG PO TABS
37.5000 mg | ORAL_TABLET | Freq: Three times a day (TID) | ORAL | Status: DC
  Administered 2013-10-06 – 2013-10-07 (×2): 37.5 mg via ORAL
  Filled 2013-10-06 (×4): qty 1.5

## 2013-10-06 MED ORDER — ISOSORBIDE MONONITRATE ER 60 MG PO TB24
60.0000 mg | ORAL_TABLET | Freq: Every day | ORAL | Status: DC
  Administered 2013-10-07: 60 mg via ORAL
  Filled 2013-10-06: qty 1

## 2013-10-06 MED ORDER — SODIUM CHLORIDE 0.9 % IJ SOLN: 3.0000 mL | Freq: Two times a day (BID) | INTRAMUSCULAR | Status: DC

## 2013-10-06 MED ORDER — PANTOPRAZOLE SODIUM 40 MG PO TBEC
40.0000 mg | DELAYED_RELEASE_TABLET | Freq: Every day | ORAL | Status: DC
  Administered 2013-10-07: 40 mg via ORAL
  Filled 2013-10-06: qty 1

## 2013-10-06 MED ORDER — INSULIN GLARGINE 100 UNIT/ML ~~LOC~~ SOLN
10.0000 [IU] | Freq: Every day | SUBCUTANEOUS | Status: DC
  Administered 2013-10-06: 10 [IU] via SUBCUTANEOUS
  Filled 2013-10-06 (×2): qty 0.1

## 2013-10-06 MED ORDER — ACETAMINOPHEN 325 MG PO TABS
650.0000 mg | ORAL_TABLET | Freq: Once | ORAL | Status: AC
  Administered 2013-10-06: 650 mg via ORAL
  Filled 2013-10-06: qty 2

## 2013-10-06 NOTE — ED Notes (Signed)
Patient arrived via [facility] from home. Patient was doing laundry and she suddenly slumped over the machine and became non-verbal. EMS arrived and patient had Right facial tingling, Left HA and left sided weakness.

## 2013-10-06 NOTE — ED Notes (Signed)
Family at bedside. 

## 2013-10-06 NOTE — ED Notes (Addendum)
Pt given a diet sprite ok per RN

## 2013-10-06 NOTE — Consult Note (Addendum)
Referring Physician: Dr Fonnie Jarvis    Chief Complaint: HA ; Non verbal  HPI: [name] is a 47 y.o. female with a complicated past medical history who was brought to the emergency department at [facility] today, 10/06/2013,  as a code stroke. The patient was at home being visited by a close friend. The patient asked the friend to go outside and hang up some clothes. The friend, who is here now, reported that she was only outside for couple minutes. When she returned to the house she found the patient slumped over and apparently awake although she was unable to speak. A neighbor came over and attempted to pick up the patient but he was unable to lift her. EMS was summoned and the patient was brought to [facility]. An initial CT scan of the head showed no acute findings. A CT angiogram of the head and neck is currently pending. The patient apparently has had a headache for several days. She also had experienced some recent chest pain but none today. In the emergency department the patient is alert and following commands but still unable to speak. She is able to write and seems to respond to questions appropriately. Of note the patient had a Medtronic defibrillator implanted September 10th of this year secondary to left ventricular dysfunction with an ejection fraction of 25-30%. There is some question as to whether she has been compliant with her medications.  Date last known well: Date: 10/06/2013 Time last known well: Time: 12:45 tPA Given: No: Unclear if this is a stroke.  Past Medical History  Diagnosis Date  . Hypertension     Poorly controlled. Has had HTN since age 85. Angioedema with ACEI.  24 Hr urine and renal arterial dopplers ordered . . . Never done  . Type II or unspecified type diabetes mellitus without mention of complication, uncontrolled DX: 2002  . NICM (nonischemic cardiomyopathy)     EF 45-50% in 8/12, cath 6/12 showed normal coronaries, EF 50-55% by LV gram  . Morbid obesity   . Allergic  rhinitis   . HLD (hyperlipidemia)   . OSA on CPAP     sleep study in 8/12 showed moderate to severe OSA requiring CPAP  . Chronic lower back pain     secondary to DJD, obsetiy, hip problems. Followed by Dr. Ivory Broad (pain management)  . 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 Chronic diastolic heart failure      Primarily diastolic CHF: Likely due to uncontrolled HTN. Last echo (8/12) with EF 45-50%, mild to moderate LVH with some asymmetric septal hypertrophy, RV normal size and systolic function. EF 50-55% by LV-gram in 6/12.   . Degeneration of lumbar or lumbosacral intervertebral disc   . Polyneuropathy in diabetes(357.2)   . Thoracic or lumbosacral neuritis or radiculitis, unspecified   . Calcifying tendinitis of shoulder   . LBBB (left bundle branch block)   . Chronic combined systolic and diastolic CHF (congestive heart failure)     EF 40-45% by echo 12/06/2012  . Coronary artery disease     questionable. LHC 05/2011 showing normal coronaries // Followed at [facility], Dr. Shirlee Latch  . GERD (gastroesophageal reflux disease)     Past Surgical History  Procedure Laterality Date  . Breast biopsies Bilateral 2011    patient reports benign results  . Tubal ligation  05/31/1985  . Hand surgery Left   . Shoulder surgery Right   . Dental surgery  tumors removed   . Cardiac catheterization  05/2011  . Pacemaker/defibrillator placed  08/22/13 Left 08/22/13    Family History  Problem Relation Age of Onset  . Heart disease Mother 64    Died of MI at age 40 yo  . Heart disease Paternal Grandmother     requiring pacemaker.  [name] Heart disease Paternal Grandfather 90    Died of MI at possibly age 90-53yo  . Stroke Paternal Grandfather   . Heart disease Father 59    MI age 56yo requiring stenting  . Kidney disease Mother     requiring dialysis  . Congestive Heart Failure Mother   . Diabetes Father   . Diabetes Brother   . Heart disease Brother 10     MI at age 24 years old  . Breast cancer Paternal Aunt   . Breast cancer Maternal Grandmother    Social History:  reports that she has never smoked. She has never used smokeless tobacco. She reports that she does not drink alcohol or use illicit drugs.  Allergies:  Allergies  Allergen Reactions  . Clindamycin Anaphylaxis and Swelling    Swelling of pain  . Enalapril Maleate Swelling  . Lisinopril Swelling    Medications:  No current facility-administered medications for this encounter.   Current Outpatient Prescriptions  Medication Sig Dispense Refill  . ACCU-CHEK FASTCLIX LANCETS MISC 1 each by Does not apply route 3 (three) times daily. USE UP TO 3 TIMES DAILY TO CHECK BLOOD SUGAR. DIAG CODE 250.00. INSULIN DEPENDENT  102 each  6  . albuterol (PROVENTIL HFA;VENTOLIN HFA) 108 (90 BASE) MCG/ACT inhaler Inhale 2 puffs into the lungs every 6 (six) hours as needed for wheezing or shortness of breath. For wheezing      . aspirin EC 81 MG tablet Take 81 mg by mouth every morning.      . Blood Glucose Monitoring Suppl (ACCU-CHEK NANO SMARTVIEW) W/DEVICE KIT 1 each by Does not apply route 3 (three) times daily with meals.  1 kit  0  . citalopram (CELEXA) 20 MG tablet Take 20 mg by mouth every morning.      . cyclobenzaprine (FLEXERIL) 10 MG tablet Take 10 mg by mouth 3 (three) times daily as needed for muscle spasms.      . diclofenac sodium (VOLTAREN) 1 % GEL Apply 2 g topically 4 (four) times daily as needed (for pain).      [name] digoxin (LANOXIN) 0.125 MG tablet Take 1 tablet (0.125 mg total) by mouth daily.  30 tablet  11  . furosemide (LASIX) 40 MG tablet Take 40 mg by mouth 2 (two) times daily.      [name] glucose blood (ACCU-CHEK SMARTVIEW) test strip Use to check blood sugar 3 times per day  100 each  12  . hydrALAZINE (APRESOLINE) 25 MG tablet Take 1.5 tablets (37.5 mg total) by mouth 3 (three) times daily.  140 tablet  11  . ibuprofen (ADVIL,MOTRIN) 200 MG tablet Take 600 mg by mouth every 6  (six) hours as needed for pain.      [name] insulin aspart (NOVOLOG FLEXPEN) 100 UNIT/ML injection Inject 5 Units into the skin 3 (three) times daily before meals.  1 vial  11  . Insulin Glargine (LANTUS SOLOSTAR) 100 UNIT/ML SOPN Inject 16 Units into the skin at bedtime.  5 pen  11  . Insulin Pen Needle 31G X 5 MM MISC Use to inject insulin 4 times a day. Dx code: 78.62.Insulin dependent.  120  each  11  . isosorbide mononitrate (IMDUR) 60 MG 24 hr tablet Take 1 tablet (60 mg total) by mouth daily.  30 tablet  11  . metoprolol succinate (TOPROL-XL) 50 MG 24 hr tablet Take 1 tablet (50 mg total) by mouth daily.  30 tablet  3  . Misc. Devices (ROLLATOR) MISC Use daily for ambulation  1 each  0  . oxyCODONE-acetaminophen (PERCOCET) 10-325 MG per tablet Take 1 tablet by mouth every 8 (eight) hours as needed for pain. For pain  70 tablet  0  . pantoprazole (PROTONIX) 40 MG tablet Take 40 mg by mouth daily.      . simvastatin (ZOCOR) 40 MG tablet Take 40 mg by mouth daily at 6 PM.      . spironolactone (ALDACTONE) 25 MG tablet Take 0.5 tablets (12.5 mg total) by mouth daily.  90 tablet  3    I have reviewed the patient's current medications.  ROS: Unobtainable this time secondary to patient's inability to speak. Apparently has had recent headaches and some recent chest pain.  Physical Examination: Blood pressure 157/90, pulse 91, temperature 97.9 F (36.6 C), temperature source Oral, resp. rate 14, last menstrual period 08/26/2013, SpO2 100.00%.  Neurologic Examination: Mental Status: Alert.  Mute.  Able to write and follow 3 step commands without difficulty.  Follows conversation well.   Cranial Nerves: II: Discs flat bilaterally; Visual fields grossly normal, pupils equal, round, reactive to light and accommodation III,IV, VI: ptosis not present, extra-ocular motions intact bilaterally V,VII: smile symmetric, facial light touch sensation decreased on the right side of the face VIII: hearing  normal bilaterally IX,X: gag reflex present XI: bilateral shoulder shrug XII: midline tongue extension Motor: Right : Upper extremity   5/5    Left:     Upper extremity   5/5  Lower extremity   5/5     Lower extremity   5/5 Tone and bulk:normal tone throughout; no atrophy noted Sensory: Pinprick and light touch intact throughout, bilaterally Deep Tendon Reflexes: 2+ and symmetric throughout Plantars: Right: downgoing   Left: downgoing Cerebellar: normal finger-to-nose and normal heel-to-shin test Gait: Unable to test CV: pulses palpable throughout   Laboratory Studies:  Basic Metabolic Panel:  Recent Labs Lab 10/03/13 1110 10/06/13 1332 10/06/13 1347  NA 135 130* 136  K 4.1 3.9 3.5  CL 97 95* 99  CO2 27 20  --   GLUCOSE 204* 364* 375*  BUN 9 11 10   CREATININE 0.75 0.67 0.80  CALCIUM 9.3 8.7  --     Liver Function Tests:  Recent Labs Lab 10/06/13 1332  AST 13  ALT 11  ALKPHOS 70  BILITOT 0.8  PROT 7.5  ALBUMIN 3.7   No results found for this basename: LIPASE, AMYLASE,  in the last 168 hours No results found for this basename: AMMONIA,  in the last 168 hours  CBC:  Recent Labs Lab 10/06/13 1332 10/06/13 1347  WBC 8.4  --   NEUTROABS 4.9  --   HGB 12.3 13.6  HCT 37.6 40.0  MCV 85.8  --   PLT 219  --     Cardiac Enzymes:  Recent Labs Lab 10/06/13 1339  TROPONINI <0.30    BNP: No components found with this basename: POCBNP,   CBG:  Recent Labs Lab 10/06/13 1436  GLUCAP 294*    Microbiology: Results for orders placed during the hospital encounter of 08/22/13  SURGICAL PCR SCREEN     Status: Abnormal   Collection  Time    08/22/13 10:34 AM      Result Value Range Status   MRSA, PCR NEGATIVE  NEGATIVE Final   Staphylococcus aureus POSITIVE (*) NEGATIVE Final   Comment:            The Xpert SA Assay (FDA     approved for NASAL specimens     in patients over 68 years of age),     is one component of     a comprehensive surveillance      program.  Test performance has     been validated by [employer] for patients greater     than or equal to 64 year old.     It is not intended     to diagnose infection nor to     guide or monitor treatment.    Coagulation Studies:  Recent Labs  10/06/13 1332  LABPROT 13.6  INR 1.06    Urinalysis:   Recent Labs Lab 10/06/13 1443  COLORURINE YELLOW  LABSPEC 1.043*  PHURINE 6.5  GLUCOSEU >1000*  HGBUR NEGATIVE  BILIRUBINUR NEGATIVE  KETONESUR NEGATIVE  PROTEINUR NEGATIVE  UROBILINOGEN 1.0  NITRITE NEGATIVE  LEUKOCYTESUR NEGATIVE    Lipid Panel:    Component Value Date/Time   CHOL 183 09/25/2013 0430   TRIG 164* 09/25/2013 0430   HDL 42 09/25/2013 0430   CHOLHDL 4.4 09/25/2013 0430   VLDL 33 09/25/2013 0430   LDLCALC 108* 09/25/2013 0430    HgbA1C:  Lab Results  Component Value Date   HGBA1C 9.2 07/24/2013    Urine Drug Screen:     Component Value Date/Time   LABOPIA NONE DETECTED 07/11/2010 0251   COCAINSCRNUR NONE DETECTED 07/11/2010 0251   LABBENZ NONE DETECTED 07/11/2010 0251   AMPHETMU NONE DETECTED 07/11/2010 0251   THCU NONE DETECTED 07/11/2010 0251   LABBARB  Value: NONE DETECTED        DRUG SCREEN FOR MEDICAL PURPOSES ONLY.  IF CONFIRMATION IS NEEDED FOR ANY PURPOSE, NOTIFY LAB WITHIN 5 DAYS.        LOWEST DETECTABLE LIMITS FOR URINE DRUG SCREEN Drug Class       Cutoff (ng/mL) Amphetamine      1000 Barbiturate      200 Benzodiazepine   200 Tricyclics       300 Opiates          300 Cocaine          300 THC              50 07/11/2010 0251    Alcohol Level:   Recent Labs Lab 10/06/13 1332  ETH <11    Other results: EKG: Sinus rhythm rate 93 beats per minute. Question recent anteroseptal infarct.  Imaging: No results found.  Stroke Risk Factors - diabetes mellitus, hyperlipidemia, hypertension and obesity with OSA  Delton See PA-C [facility] Pager [phone] 10/06/2013, 3:57 PM  Patient seen and examined.   Clinical course and management discussed.  Necessary edits performed.  I agree with the above.  Assessment and plan of care developed and discussed below.    Assessment: 47 y.o. female with multiple medical issues who was brought to the emergency department today as a code stroke after she was found by her friend slumped over and unable to speak.  Although she remains mute she is able to move all extremities equally. She continues to complain of headache, BP is elevated and blood sugars are elevated.  Although patient with multiple risk factors do not suspect ischemic disease at this time.  CT of the head reviewed and shows no acute changes.  Recommendations: 1.  CTA of the head and neck  Thana Farr, MD [facility] [phone]  10/06/2013  3:57 PM  Addendum: CTA discussed with radiology and shows a high grade stenosis at the superior division of M2.  No ischemia noted.  This would not explain the patient's symptoms.  Patient has now returned to baseline.  She is able to speak fluently.  Continues to have no focal weakness.   Symptoms likely a consequence of her headache and multiple other poorly controlled co-morbidities.  No further neurologic work up recommended at this time.    Cased discussed with Dr. Jennet Maduro, MD [facility] [phone]

## 2013-10-06 NOTE — ED Notes (Signed)
Pt has eaten evening meal, no changes.

## 2013-10-06 NOTE — ED Notes (Signed)
Patient is now verbal and alert. She states she feels like she is in a fog but is oriented x4. Slight tingling in her right facial area that is less than when she presented. She has no weakness in her extremities. She states she still has a headache 5/10.

## 2013-10-06 NOTE — ED Notes (Signed)
Evening meal ordered. Med given for HA. Snack given.

## 2013-10-06 NOTE — H&P (Signed)
Date: 10/06/2013               Patient Name:  [name] MRN: [id number]  DOB: 11-28-1966 Age / Sex: 47 y.o., female   PCP: Courtney Paris, MD         Medical Service: Internal Medicine Teaching Service         Attending Physician: Dr. [name]    First Contact: Dr. Carlynn Purl Pager: [phone]  Second Contact: Dr. Darden Palmer Pager: [phone]       After Hours (After 5p/  First Contact Pager: [phone]  weekends / holidays): Second Contact Pager: [phone]   Chief Complaint: Altered mental Status, Unable to speak  History of Present Illness: [name] is a 47 year old female with a PMH of CHF with recent placement of ICD, HTN, DMT2 (last A1c 9.2) w/ Neuropathy, OA, HLD.  She presented to the ED today after a friend witnessed that around 12pm she was awake but not responsive to her.  When EMS responded patient was able to walk to the ambulance but still unable to speak.  In the ED she was reported to have some right facial numbness but otherwise no other focal neurologic deficit other than inability to phonate.  A code stroke was called and patient was evaluated by Neurology and a head CT was obtained which was negative.  A CTA of the head and neck was also obtained which showed a focal high grade stenosis of the right M2, and cervical spondylosis.  Neurology did not feel this was cause of her symptoms and her symptoms resolved completely. Of note patient reports she does not remember anything since around 12pm today until after she arrived at the ED.  She also notes she has had headaches over the past week.  Meds: No current facility-administered medications for this encounter.   Current Outpatient Prescriptions  Medication Sig Dispense Refill  . albuterol (PROVENTIL HFA;VENTOLIN HFA) 108 (90 BASE) MCG/ACT inhaler Inhale 2 puffs into the lungs every 6 (six) hours as needed for wheezing or shortness of breath. For wheezing      . aspirin EC 81 MG tablet Take 81 mg by mouth  every morning.      . citalopram (CELEXA) 20 MG tablet Take 20 mg by mouth every morning.      . cyclobenzaprine (FLEXERIL) 10 MG tablet Take 10 mg by mouth 3 (three) times daily as needed for muscle spasms.      . diclofenac sodium (VOLTAREN) 1 % GEL Apply 2 g topically 4 (four) times daily as needed (for pain).      [name] digoxin (LANOXIN) 0.125 MG tablet Take 1 tablet (0.125 mg total) by mouth daily.  30 tablet  11  . furosemide (LASIX) 40 MG tablet Take 40 mg by mouth 2 (two) times daily.      . hydrALAZINE (APRESOLINE) 25 MG tablet Take 1.5 tablets (37.5 mg total) by mouth 3 (three) times daily.  140 tablet  11  . ibuprofen (ADVIL,MOTRIN) 200 MG tablet Take 600 mg by mouth every 6 (six) hours as needed for pain.      [name] insulin aspart (NOVOLOG FLEXPEN) 100 UNIT/ML injection Inject 5 Units into the skin 3 (three) times daily before meals.  1 vial  11  . Insulin Pen Needle 31G X 5 MM MISC Use to inject insulin 4 times a day. Dx code: 16.62.Insulin dependent.  120 each  11  . isosorbide mononitrate (IMDUR) 60 MG 24 hr  tablet Take 1 tablet (60 mg total) by mouth daily.  30 tablet  11  . metoprolol succinate (TOPROL-XL) 50 MG 24 hr tablet Take 1 tablet (50 mg total) by mouth daily.  30 tablet  3  . oxyCODONE-acetaminophen (PERCOCET) 10-325 MG per tablet Take 1 tablet by mouth every 8 (eight) hours as needed for pain. For pain  70 tablet  0  . pantoprazole (PROTONIX) 40 MG tablet Take 40 mg by mouth daily.      . simvastatin (ZOCOR) 40 MG tablet Take 40 mg by mouth daily at 6 PM.      . spironolactone (ALDACTONE) 25 MG tablet Take 0.5 tablets (12.5 mg total) by mouth daily.  90 tablet  3  . ACCU-CHEK FASTCLIX LANCETS MISC 1 each by Does not apply route 3 (three) times daily. USE UP TO 3 TIMES DAILY TO CHECK BLOOD SUGAR. DIAG CODE 250.00. INSULIN DEPENDENT  102 each  6  . Blood Glucose Monitoring Suppl (ACCU-CHEK NANO SMARTVIEW) W/DEVICE KIT 1 each by Does not apply route 3 (three) times daily with meals.   1 kit  0  . glucose blood (ACCU-CHEK SMARTVIEW) test strip Use to check blood sugar 3 times per day  100 each  12  . Insulin Glargine (LANTUS SOLOSTAR) 100 UNIT/ML SOPN Inject 16 Units into the skin at bedtime.  5 pen  11  . Misc. Devices (ROLLATOR) MISC Use daily for ambulation  1 each  0    Allergies: Allergies as of 10/06/2013 - Review Complete 10/06/2013  Allergen Reaction Noted  . Clindamycin Anaphylaxis and Swelling   . Enalapril maleate Swelling   . Lisinopril Swelling    Past Medical History  Diagnosis Date  . Hypertension     Poorly controlled. Has had HTN since age 52. Angioedema with ACEI.  24 Hr urine and renal arterial dopplers ordered . . . Never done  . Type II or unspecified type diabetes mellitus without mention of complication, uncontrolled DX: 2002  . NICM (nonischemic cardiomyopathy)     EF 45-50% in 8/12, cath 6/12 showed normal coronaries, EF 50-55% by LV gram  . Morbid obesity   . Allergic rhinitis   . HLD (hyperlipidemia)   . OSA on CPAP     sleep study in 8/12 showed moderate to severe OSA requiring CPAP  . Chronic lower back pain     secondary to DJD, obsetiy, hip problems. Followed by Dr. Ivory Broad (pain management)  . 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 Chronic diastolic heart failure      Primarily diastolic CHF: Likely due to uncontrolled HTN. Last echo (8/12) with EF 45-50%, mild to moderate LVH with some asymmetric septal hypertrophy, RV normal size and systolic function. EF 50-55% by LV-gram in 6/12.   . Degeneration of lumbar or lumbosacral intervertebral disc   . Polyneuropathy in diabetes(357.2)   . Thoracic or lumbosacral neuritis or radiculitis, unspecified   . Calcifying tendinitis of shoulder   . LBBB (left bundle branch block)   . Chronic combined systolic and diastolic CHF (congestive heart failure)     EF 40-45% by echo 12/06/2012  . Coronary artery disease     questionable. LHC 05/2011 showing normal  coronaries // Followed at [facility], Dr. Shirlee Latch  . GERD (gastroesophageal reflux disease)    Past Surgical History  Procedure Laterality Date  . Breast biopsies Bilateral 2011    patient reports benign results  . Tubal ligation  05/31/1985  . Hand surgery Left   . Shoulder surgery Right   . Dental surgery      tumors removed   . Cardiac catheterization  05/2011  . Pacemaker/defibrillator placed  08/22/13 Left 08/22/13   Family History  Problem Relation Age of Onset  . Heart disease Mother 58    Died of MI at age 66 yo  . Heart disease Paternal Grandmother     requiring pacemaker.  [name] Heart disease Paternal Grandfather 8    Died of MI at possibly age 58-53yo  . Stroke Paternal Grandfather   . Heart disease Father 63    MI age 87yo requiring stenting  . Kidney disease Mother     requiring dialysis  . Congestive Heart Failure Mother   . Diabetes Father   . Diabetes Brother   . Heart disease Brother 51    MI at age 46 years old  . Breast cancer Paternal Aunt   . Breast cancer Maternal Grandmother    History   Social History  . Marital Status: Single    Spouse Name: N/A    Number of Children: 2  . Years of Education: 9th grade   Occupational History  . Unemployed     planning on getting disability   Social History Main Topics  . Smoking status: Never Smoker   . Smokeless tobacco: Never Used  . Alcohol Use: No  . Drug Use: No  . Sexual Activity: Yes    Birth Control/ Protection: Surgical   Other Topics Concern  . Not on file   Social History Narrative   Lives in [place] with her son. Is able to read and write fluently in [place].    Review of Systems: Review of Systems  Constitutional: Negative for fever, chills and weight loss.  HENT: Negative for congestion, hearing loss, sore throat and tinnitus.   Eyes: Negative for blurred vision, double vision, photophobia and pain.  Respiratory: Negative for cough and shortness of breath.     Cardiovascular: Positive for palpitations. Negative for chest pain, orthopnea, leg swelling and PND.  Gastrointestinal: Negative for heartburn, nausea, vomiting, abdominal pain, diarrhea and constipation.  Genitourinary: Negative for dysuria and frequency.  Musculoskeletal: Negative for myalgias.  Neurological: Positive for tingling, sensory change, speech change and weakness. Negative for dizziness, tremors, seizures, loss of consciousness and headaches.  Psychiatric/Behavioral: Positive for depression. Negative for suicidal ideas and substance abuse. The patient is nervous/anxious.      Physical Exam: Blood pressure 134/78, pulse 95, temperature 97.7 F (36.5 C), temperature source Oral, resp. rate 16, last menstrual period 08/26/2013, SpO2 100.00%. Physical Exam  Nursing note and vitals reviewed. Constitutional: She is oriented to person, place, and time and well-developed, well-nourished, and in no distress. No distress.  HENT:  Head: Normocephalic and atraumatic.  Eyes: EOM are normal. Pupils are equal, round, and reactive to light.  Neck: Normal range of motion.  Did not appreciate any cystic nodules on thyroid  Cardiovascular: Normal rate, regular rhythm, normal heart sounds and intact distal pulses.   No murmur heard. Pulmonary/Chest: Effort normal and breath sounds normal. No respiratory distress. She has no wheezes.  ICD surgical scar present on left anterior chest.  Abdominal: Soft. Bowel sounds are normal. She exhibits no distension. There is no tenderness. There is no rebound.  Neurological: She is alert and oriented to person, place, and time. She has normal sensation, normal strength and intact cranial nerves. She is not agitated and not disoriented. She displays  facial symmetry and normal speech. No cranial nerve deficit or sensory deficit. She has a normal Cerebellar Exam and a normal Finger-Nose-Finger Test. She shows no pronator drift. GCS score is 15.  Skin: She is not  diaphoretic.  Psychiatric: Mood, memory, affect and judgment normal.     Lab results: Basic Metabolic Panel:  Recent Labs  16/10/96 1332 10/06/13 1347  NA 130* 136  K 3.9 3.5  CL 95* 99  CO2 20  --   GLUCOSE 364* 375*  BUN 11 10  CREATININE 0.67 0.80  CALCIUM 8.7  --    Liver Function Tests:  Recent Labs  10/06/13 1332  AST 13  ALT 11  ALKPHOS 70  BILITOT 0.8  PROT 7.5  ALBUMIN 3.7  CBC:  Recent Labs  10/06/13 1332 10/06/13 1347  WBC 8.4  --   NEUTROABS 4.9  --   HGB 12.3 13.6  HCT 37.6 40.0  MCV 85.8  --   PLT 219  --    Cardiac Enzymes:  Recent Labs  10/06/13 1339  TROPONINI <0.30   CBG:  Recent Labs  10/06/13 1436  GLUCAP 294*   Coagulation:  Recent Labs  10/06/13 1332  LABPROT 13.6  INR 1.06   Urine Drug Screen: Drugs of Abuse     Component Value Date/Time   LABOPIA NONE DETECTED 10/06/2013 1443   COCAINSCRNUR NONE DETECTED 10/06/2013 1443   LABBENZ NONE DETECTED 10/06/2013 1443   AMPHETMU NONE DETECTED 10/06/2013 1443   THCU NONE DETECTED 10/06/2013 1443   LABBARB NONE DETECTED 10/06/2013 1443    Alcohol Level:  Recent Labs  10/06/13 1332  ETH <11   Urinalysis:  Recent Labs  10/06/13 1443  COLORURINE YELLOW  LABSPEC 1.043*  PHURINE 6.5  GLUCOSEU >1000*  HGBUR NEGATIVE  BILIRUBINUR NEGATIVE  KETONESUR NEGATIVE  PROTEINUR NEGATIVE  UROBILINOGEN 1.0  NITRITE NEGATIVE  LEUKOCYTESUR NEGATIVE     Imaging results:  Ct Angio Head W/cm &/or Wo Cm  10/06/2013   CLINICAL DATA:  Sudden aphasia.  Left-sided weakness. Headache.  EXAM: CT ANGIOGRAPHY HEAD AND NECK  TECHNIQUE: Multidetector CT imaging of the head and neck was performed using the standard protocol during bolus administration of intravenous contrast. Multiplanar CT image reconstructions including MIPs were obtained to evaluate the vascular anatomy. Carotid stenosis measurements (when applicable) are obtained utilizing NASCET criteria, using the distal  internal carotid diameter as the denominator.  CONTRAST:  OMNIPAQUE IOHEXOL 350 MG/ML SOLN  COMPARISON:  CT head from the same day.  MRI brain 12/16/2010.  FINDINGS: CTA HEAD FINDINGS  Subcortical white matter disease is similar to the prior exam. The cortex appears intact. No acute cortical infarct is evident. There is no hemorrhage or mass lesion. No enhancing lesions are evident.  Mild atherosclerotic calcifications are evident within the cavernous carotid arteries. The A1 and M1 segments are normal. The anterior communicating artery is patent. The MCA bifurcations are unremarkable. There is a proximal focal stenosis within the superior right M2 division. There is mild attenuation of distal MCA branch vessels in this distribution. No aneurysm is present.  The left vertebral artery is the dominant vessel. The basilar artery is normal. Both posterior cerebral arteries originate from the basilar tip. The dural venous structures fill normally.  Review of the MIP images confirms the above findings.  CTA NECK FINDINGS  A standard 3 vessel arch configuration is present.  The vertebral arteries both originate from the subclavian arteries. The left vertebral artery is the dominant vessel.  There is moderate artifact across the neck base secondary to the patient's pacemaker and body habitus. No significant vertebral arteries stenoses are present.  The right common carotid artery is within normal limits. The bifurcation is unremarkable. There is tortuosity of the cervical right ICA without a significant stenosis.  The left common carotid artery is tortuous. The bifurcation is unremarkable. Tortuosity of the cervical left ICA is present without a significant stenosis.  There is reversal of normal cervical lordosis and with multilevel endplate changes. No focal lytic or blastic bone lesion is present.  Review of the MIP images confirms the above findings.  IMPRESSION: CTA HEAD IMPRESSION  1. Focal high-grade stenosis of  the superior division right M2. Ischemic changes in this distribution could result in the patient's symptoms. 2. No other significant vascular lesion. 3. No cortical infarct is evident. No item stable white matter disease.  CTA NECK IMPRESSION  1. Mild tortuosity of the cervical internal carotid arteries bilaterally as well as the left common carotid artery. 2. No significant vascular stenosis in the neck. 3. Cervical spondylosis is advanced for age.   Electronically Signed   By: Gennette Pac M.D.   On: 10/06/2013 14:57   Ct Head Wo Contrast  10/06/2013   CLINICAL DATA:  Code stroke, nonverbal, left-sided facial numbness, left arm weakness  EXAM: CT HEAD WITHOUT CONTRAST  TECHNIQUE: Contiguous axial images were obtained from the base of the skull through the vertex without intravenous contrast.  COMPARISON:  12/16/2010  FINDINGS: Calvarium is intact. No hemorrhage or extra-axial fluid. No evidence of hydrocephalus or mass. No vascular territory infarct appreciated.  IMPRESSION: Negative CT of the head. Critical Value/emergent results were called by telephone at the time of interpretation on 10/06/2013 at 1:54 PM to Dr.Reynolds, who verbally acknowledged these results.   Electronically Signed   By: Esperanza Heir M.D.   On: 10/06/2013 13:55   Ct Angio Neck W/cm &/or Wo/cm  10/06/2013   CLINICAL DATA:  Sudden aphasia.  Left-sided weakness. Headache.  EXAM: CT ANGIOGRAPHY HEAD AND NECK  TECHNIQUE: Multidetector CT imaging of the head and neck was performed using the standard protocol during bolus administration of intravenous contrast. Multiplanar CT image reconstructions including MIPs were obtained to evaluate the vascular anatomy. Carotid stenosis measurements (when applicable) are obtained utilizing NASCET criteria, using the distal internal carotid diameter as the denominator.  CONTRAST:  OMNIPAQUE IOHEXOL 350 MG/ML SOLN  COMPARISON:  CT head from the same day.  MRI brain 12/16/2010.  FINDINGS:  CTA HEAD FINDINGS  Subcortical white matter disease is similar to the prior exam. The cortex appears intact. No acute cortical infarct is evident. There is no hemorrhage or mass lesion. No enhancing lesions are evident.  Mild atherosclerotic calcifications are evident within the cavernous carotid arteries. The A1 and M1 segments are normal. The anterior communicating artery is patent. The MCA bifurcations are unremarkable. There is a proximal focal stenosis within the superior right M2 division. There is mild attenuation of distal MCA branch vessels in this distribution. No aneurysm is present.  The left vertebral artery is the dominant vessel. The basilar artery is normal. Both posterior cerebral arteries originate from the basilar tip. The dural venous structures fill normally.  Review of the MIP images confirms the above findings.  CTA NECK FINDINGS  A standard 3 vessel arch configuration is present.  The vertebral arteries both originate from the subclavian arteries. The left vertebral artery is the dominant vessel. There is moderate artifact across the  neck base secondary to the patient's pacemaker and body habitus. No significant vertebral arteries stenoses are present.  The right common carotid artery is within normal limits. The bifurcation is unremarkable. There is tortuosity of the cervical right ICA without a significant stenosis.  The left common carotid artery is tortuous. The bifurcation is unremarkable. Tortuosity of the cervical left ICA is present without a significant stenosis.  There is reversal of normal cervical lordosis and with multilevel endplate changes. No focal lytic or blastic bone lesion is present.  Review of the MIP images confirms the above findings.  IMPRESSION: CTA HEAD IMPRESSION  1. Focal high-grade stenosis of the superior division right M2. Ischemic changes in this distribution could result in the patient's symptoms. 2. No other significant vascular lesion. 3. No cortical  infarct is evident. No item stable white matter disease.  CTA NECK IMPRESSION  1. Mild tortuosity of the cervical internal carotid arteries bilaterally as well as the left common carotid artery. 2. No significant vascular stenosis in the neck. 3. Cervical spondylosis is advanced for age.   Electronically Signed   By: Gennette Pac M.D.   On: 10/06/2013 14:57    Other results: EKG: Sinus, rate 92, nonspecific ST T wave changes that are unchanged from previous EKG.  Assessment & Plan by Problem: #  Aphasia, questionable Acute Encephalopathy, Headache Patients aphasia and reported altered mental status have resolved completely.  Neurology has evaluated her and feels that her symptoms may have been secondary to her HA ?complex migraine, and her other poorly controlled co morbidities, they do not feel a further neurologic work up is warranted at this time, neurology did not feel that these symptoms represented a TIA.  Patient also reports numerous social stressors and this episode may be a result of conversion disorder.  Will observe patient overnight on telemetry. -Admit to Telemetry, Observation -Neuro checks Q4. -Seizure precautions #  Chronic combined systolic and diastolic heart failure - Does not appear to be volume overloaded on exam.  No SOB.   -Cycle troponins  -Asa 81mg  -Digoxin 0.125mg  daily -Lasix 40mg  BID -Metoprolol Succinate 50mg  daily -Simvastatin 40mg  daily -Spironolactone 12.5mg  daily -Imdur  # ESSENTIAL HYPERTENSION -Lasix -Hydralazine 37.5mg  TID -Metoprolol -Monitor #  OSA (obstructive sleep apnea) -CPAP #  Type 2 diabetes mellitus, uncontrolled, with neuropathy -Monitor CBGs - Will hold metformin during hospital stay and for 48 hours after contrast. -Lantus 10units QHS -SSI #Pseudohyponatremia -Na 130, Glucose 364 corrected Na 136. DVT PPx: Lovenox   Dispo: Disposition is deferred at this time, awaiting improvement of current medical problems. Anticipated  discharge in approximately 1 day(s).   The patient does have a current PCP Courtney Paris, MD) and does need an [facility] hospital follow-up appointment after discharge.  The patient does not have transportation limitations that hinder transportation to clinic appointments.  Signed: Carlynn Purl, DO 10/06/2013, 6:48 PM

## 2013-10-06 NOTE — ED Notes (Signed)
Going to 3W 16

## 2013-10-06 NOTE — ED Provider Notes (Signed)
CSN: [id number]     Arrival date & time 10/06/13  1331 History   First MD Initiated Contact with Patient 10/06/13 1330     Chief Complaint  Patient presents with  . Code Stroke   code stroke (Consider location/radiation/quality/duration/timing/severity/associated sxs/prior Treatment) HPI This 47 year old female was brought by EMS as a code stroke patient, it is difficult to obtain history from the patient due to her nonverbal status, she does not read yes and no cancer some simple questions, apparently she has had a headache for the last few days and was last known well at 1245 this afternoon when she developed an inability to speak with questionable left arm and leg weakness with possible right sided facial numbness, there's been no trauma fever chest pain shortness breath abdominal pain. She has a history of diabetes and heart failure. She also has a pacemaker and defibrillator and a left bundle branch block. Past Medical History  Diagnosis Date  . Hypertension     Poorly controlled. Has had HTN since age 35. Angioedema with ACEI.  24 Hr urine and renal arterial dopplers ordered . . . Never done  . Type II or unspecified type diabetes mellitus without mention of complication, uncontrolled DX: 2002  . NICM (nonischemic cardiomyopathy)     EF 45-50% in 8/12, cath 6/12 showed normal coronaries, EF 50-55% by LV gram  . Morbid obesity   . Allergic rhinitis   . HLD (hyperlipidemia)   . OSA on CPAP     sleep study in 8/12 showed moderate to severe OSA requiring CPAP  . Chronic lower back pain     secondary to DJD, obsetiy, hip problems. Followed by Dr. Ivory Broad (pain management)  . 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 Chronic diastolic heart failure      Primarily diastolic CHF: Likely due to uncontrolled HTN. Last echo (8/12) with EF 45-50%, mild to moderate LVH with some asymmetric septal hypertrophy, RV normal size and systolic function. EF 50-55% by LV-gram in  6/12.   . Degeneration of lumbar or lumbosacral intervertebral disc   . Polyneuropathy in diabetes(357.2)   . Thoracic or lumbosacral neuritis or radiculitis, unspecified   . Calcifying tendinitis of shoulder   . LBBB (left bundle branch block)   . Chronic combined systolic and diastolic CHF (congestive heart failure)     EF 40-45% by echo 12/06/2012  . Coronary artery disease     questionable. LHC 05/2011 showing normal coronaries // Followed at [facility], Dr. Shirlee Latch  . GERD (gastroesophageal reflux disease)    Past Surgical History  Procedure Laterality Date  . Breast biopsies Bilateral 2011    patient reports benign results  . Tubal ligation  05/31/1985  . Hand surgery Left   . Shoulder surgery Right   . Dental surgery      tumors removed   . Cardiac catheterization  05/2011  . Pacemaker/defibrillator placed  08/22/13 Left 08/22/13   Family History  Problem Relation Age of Onset  . Heart disease Mother 12    Died of MI at age 79 yo  . Heart disease Paternal Grandmother     requiring pacemaker.  [name] Heart disease Paternal Grandfather 5    Died of MI at possibly age 44-53yo  . Stroke Paternal Grandfather   . Heart disease Father 38    MI age 78yo requiring stenting  . Kidney disease Mother     requiring dialysis  . Congestive Heart  Failure Mother   . Diabetes Father   . Diabetes Brother   . Heart disease Brother 44    MI at age 4 years old  . Breast cancer Paternal Aunt   . Breast cancer Maternal Grandmother    History  Substance Use Topics  . Smoking status: Never Smoker   . Smokeless tobacco: Never Used  . Alcohol Use: No   OB History   Grav Para Term Preterm Abortions TAB SAB Ect Mult Living                 Review of Systems  Unable to perform ROS: Mental status change    Allergies  Clindamycin; Enalapril maleate; and Lisinopril  Home Medications   Current Outpatient Rx  Name  Route  Sig  Dispense  Refill  . albuterol (PROVENTIL  HFA;VENTOLIN HFA) 108 (90 BASE) MCG/ACT inhaler   Inhalation   Inhale 2 puffs into the lungs every 6 (six) hours as needed for wheezing or shortness of breath. For wheezing         . aspirin EC 81 MG tablet   Oral   Take 81 mg by mouth every morning.         . citalopram (CELEXA) 20 MG tablet   Oral   Take 20 mg by mouth every morning.         . cyclobenzaprine (FLEXERIL) 10 MG tablet   Oral   Take 10 mg by mouth 3 (three) times daily as needed for muscle spasms.         . diclofenac sodium (VOLTAREN) 1 % GEL   Topical   Apply 2 g topically 4 (four) times daily as needed (for pain).         [name] digoxin (LANOXIN) 0.125 MG tablet   Oral   Take 1 tablet (0.125 mg total) by mouth daily.   30 tablet   11   . furosemide (LASIX) 40 MG tablet   Oral   Take 40 mg by mouth 2 (two) times daily.         . hydrALAZINE (APRESOLINE) 25 MG tablet   Oral   Take 1.5 tablets (37.5 mg total) by mouth 3 (three) times daily.   140 tablet   11   . ibuprofen (ADVIL,MOTRIN) 200 MG tablet   Oral   Take 600 mg by mouth every 6 (six) hours as needed for pain.         [name] insulin aspart (NOVOLOG FLEXPEN) 100 UNIT/ML injection   Subcutaneous   Inject 5 Units into the skin 3 (three) times daily before meals.   1 vial   11   . Insulin Pen Needle 31G X 5 MM MISC      Use to inject insulin 4 times a day. Dx code: 79.62.Insulin dependent.   120 each   11   . isosorbide mononitrate (IMDUR) 60 MG 24 hr tablet   Oral   Take 1 tablet (60 mg total) by mouth daily.   30 tablet   11   . metoprolol succinate (TOPROL-XL) 50 MG 24 hr tablet   Oral   Take 1 tablet (50 mg total) by mouth daily.   30 tablet   3   . oxyCODONE-acetaminophen (PERCOCET) 10-325 MG per tablet   Oral   Take 1 tablet by mouth every 8 (eight) hours as needed for pain. For pain   70 tablet   0   . pantoprazole (PROTONIX) 40 MG tablet   Oral  Take 40 mg by mouth daily.         [name] spironolactone  (ALDACTONE) 25 MG tablet   Oral   Take 0.5 tablets (12.5 mg total) by mouth daily.   90 tablet   3   . ACCU-CHEK FASTCLIX LANCETS MISC   Does not apply   1 each by Does not apply route 3 (three) times daily. USE UP TO 3 TIMES DAILY TO CHECK BLOOD SUGAR. DIAG CODE 250.00. INSULIN DEPENDENT   102 each   6   . atorvastatin (LIPITOR) 80 MG tablet   Oral   Take 1 tablet (80 mg total) by mouth daily.   30 tablet   11   . Blood Glucose Monitoring Suppl (ACCU-CHEK NANO SMARTVIEW) W/DEVICE KIT   Does not apply   1 each by Does not apply route 3 (three) times daily with meals.   1 kit   0   . glucose blood (ACCU-CHEK SMARTVIEW) test strip      Use to check blood sugar 3 times per day   100 each   12     Strips for Accu-Chek Nano Smartview Glucometer.  D ...   . Insulin Glargine (LANTUS SOLOSTAR) 100 UNIT/ML SOPN   Subcutaneous   Inject 20 Units into the skin at bedtime.   5 pen   11   . Misc. Devices (ROLLATOR) MISC      Use daily for ambulation   1 each   0    BP 130/80  Pulse 85  Temp(Src) 98.1 F (36.7 C) (Oral)  Resp 18  Ht 5\' 9"  (1.753 m)  Wt 288 lb 3.2 oz (130.727 kg)  BMI 42.54 kg/m2  SpO2 99%  LMP 08/26/2013 Physical Exam  Nursing note and vitals reviewed. Constitutional:  Awake, alert, nontoxic appearance  HENT:  Head: Atraumatic.  Mouth/Throat: No oropharyngeal exudate.  Eyes: EOM are normal. Pupils are equal, round, and reactive to light. Right eye exhibits no discharge. Left eye exhibits no discharge.  Neck: Neck supple.  Cardiovascular: Normal rate and regular rhythm.   No murmur heard. Pulmonary/Chest: Effort normal and breath sounds normal. No stridor. No respiratory distress. She has no wheezes. She has no rales. She exhibits no tenderness.  Abdominal: Soft. Bowel sounds are normal. She exhibits no mass. There is no tenderness. There is no rebound.  Musculoskeletal: She exhibits no tenderness.  Baseline ROM, moves extremities with no obvious  new focal weakness.  Lymphadenopathy:    She has no cervical adenopathy.  Neurological: She is alert.  Awake, alert, cooperative and aware of situation; motor strength 5/5 arms and 3/5 legsbilaterally; sensation normal to light touch bilaterally arms and legs; peripheral visual fields full to confrontation; no facial asymmetry; tongue midline; major cranial nerves appear intact but might have decreased light touch right side of face; no pronator drift arms, normal finger to nose bilaterally  Skin: No rash noted.  Psychiatric: She has a normal mood and affect.    ED Course  Procedures (including critical care time) After patient was seen by neurology initially, her symptoms resolve completely in ED and Neuro saw her again, Pt back to baseline in ED with normal speech, OPC to see Pt in ED to consider Obs for transient AMS and hyperglycemia.  Labs Review Labs Reviewed  COMPREHENSIVE METABOLIC PANEL - Abnormal; Notable for the following:    Sodium 130 (*)    Chloride 95 (*)    Glucose, Bld 364 (*)    All other components within  normal limits  URINALYSIS, ROUTINE W REFLEX MICROSCOPIC - Abnormal; Notable for the following:    APPearance CLOUDY (*)    Specific Gravity, Urine 1.043 (*)    Glucose, UA >1000 (*)    All other components within normal limits  GLUCOSE, CAPILLARY - Abnormal; Notable for the following:    Glucose-Capillary 294 (*)    All other components within normal limits  URINE MICROSCOPIC-ADD ON - Abnormal; Notable for the following:    Bacteria, UA FEW (*)    All other components within normal limits  BASIC METABOLIC PANEL - Abnormal; Notable for the following:    Glucose, Bld 242 (*)    GFR calc non Af Amer 83 (*)    All other components within normal limits  GLUCOSE, CAPILLARY - Abnormal; Notable for the following:    Glucose-Capillary 212 (*)    All other components within normal limits  GLUCOSE, CAPILLARY - Abnormal; Notable for the following:    Glucose-Capillary  203 (*)    All other components within normal limits  POCT I-STAT, CHEM 8 - Abnormal; Notable for the following:    Glucose, Bld 375 (*)    All other components within normal limits  ETHANOL  PROTIME-INR  APTT  CBC  DIFFERENTIAL  TROPONIN I  URINE RAPID DRUG SCREEN (HOSP PERFORMED)  PREGNANCY, URINE  TROPONIN I  TROPONIN I  HIV ANTIBODY (ROUTINE TESTING)  POCT I-STAT TROPONIN I   Imaging Review No results found.  EKG Interpretation     Ventricular Rate:  92 PR Interval:  162 QRS Duration: 132 QT Interval:  415 QTC Calculation: 513 R Axis:   114 Text Interpretation:  Sinus rhythm Consider right atrial enlargement Nonspecific intraventricular conduction delay Probable anteroseptal infarct, recent No significant change since last tracing            MDM   1. Aphasia   2. Headache(784.0)   3. Altered mental status   4. Hyperglycemia   5. Nonischemic cardiomyopathy   6. Obesity   7. OSA (obstructive sleep apnea)   8. Type 2 diabetes mellitus, uncontrolled, with neuropathy   9. Unspecified essential hypertension   10. Chronic combined systolic and diastolic heart failure   11. Prolonged Q-T interval on ECG    The patient appears reasonably stabilized for admission considering the current resources, flow, and capabilities available in the ED at this time, and I doubt any other [facility] requiring further screening and/or treatment in the ED prior to admission.    Hurman Horn, MD 10/14/13 2114

## 2013-10-06 NOTE — ED Notes (Signed)
MD at bedside. 

## 2013-10-07 ENCOUNTER — Encounter (HOSPITAL_COMMUNITY): Payer: Self-pay | Admitting: *Deleted

## 2013-10-07 DIAGNOSIS — I5042 Chronic combined systolic (congestive) and diastolic (congestive) heart failure: Secondary | ICD-10-CM

## 2013-10-07 DIAGNOSIS — R9431 Abnormal electrocardiogram [ECG] [EKG]: Secondary | ICD-10-CM

## 2013-10-07 LAB — BASIC METABOLIC PANEL
BUN: 11 mg/dL (ref 6–23)
Chloride: 99 mEq/L (ref 96–112)
Creatinine, Ser: 0.83 mg/dL (ref 0.50–1.10)
GFR calc Af Amer: >90 mL/min (ref >90)
GFR calc non Af Amer: 83 mL/min — ABNORMAL LOW (ref >90)
Glucose, Bld: 242 mg/dL — ABNORMAL HIGH (ref 70–99)
Sodium: 135 mEq/L (ref 135–145)

## 2013-10-07 MED ORDER — IBUPROFEN 400 MG PO TABS
400.0000 mg | ORAL_TABLET | Freq: Once | ORAL | Status: AC
  Administered 2013-10-07: 400 mg via ORAL
  Filled 2013-10-07: qty 1

## 2013-10-07 MED ORDER — SIMETHICONE 80 MG PO CHEW
80.0000 mg | CHEWABLE_TABLET | Freq: Once | ORAL | Status: AC
  Administered 2013-10-07: 80 mg via ORAL
  Filled 2013-10-07: qty 1

## 2013-10-07 NOTE — Progress Notes (Signed)
Subjective: No events overnight, no significant findings on telemetry.  Patient does report mild HA this morning, usually takes 400mg  Ibuprofen.  Reports she slept the best she has in a long time last night. Objective: Vital signs in last 24 hours: Filed Vitals:   10/07/13 0200 10/07/13 0400 10/07/13 0600 10/07/13 0730  BP: 144/86 140/66 133/80 130/80  Pulse: 97 79 91 85  Temp: 98.1 F (36.7 C) 98.9 F (37.2 C) 98.1 F (36.7 C)   TempSrc:      Resp: 18 18 18 18   Height:      Weight:      SpO2: 99% 99% 99% 99%   Weight change:  No intake or output data in the 24 hours ending 10/07/13 1029 General: resting in bed, NAD HEENT: PERRL, EOMI Cardiac: RRR Pulm: clear to auscultation bilaterally Abd: obese, soft, nontender, nondistended, BS present Neuro: alert and oriented X3, cranial nerves II-XII grossly intact  Lab Results: Basic Metabolic Panel:  Recent Labs Lab 10/06/13 1332 10/06/13 1347 10/07/13 0425  NA 130* 136 135  K 3.9 3.5 4.1  CL 95* 99 99  CO2 20  --  25  GLUCOSE 364* 375* 242*  BUN 11 10 11   CREATININE 0.67 0.80 0.83  CALCIUM 8.7  --  8.9   Liver Function Tests:  Recent Labs Lab 10/06/13 1332  AST 13  ALT 11  ALKPHOS 70  BILITOT 0.8  PROT 7.5  ALBUMIN 3.7   CBC:  Recent Labs Lab 10/06/13 1332 10/06/13 1347  WBC 8.4  --   NEUTROABS 4.9  --   HGB 12.3 13.6  HCT 37.6 40.0  MCV 85.8  --   PLT 219  --    Cardiac Enzymes:  Recent Labs Lab 10/06/13 1339 10/06/13 2155 10/07/13 0425  TROPONINI <0.30 <0.30 <0.30   CBG:  Recent Labs Lab 10/06/13 1436 10/06/13 2125 10/07/13 0723  GLUCAP 294* 212* 203*  Coagulation:  Recent Labs Lab 10/06/13 1332  LABPROT 13.6  INR 1.06  Urine Drug Screen: Drugs of Abuse     Component Value Date/Time   LABOPIA NONE DETECTED 10/06/2013 1443   COCAINSCRNUR NONE DETECTED 10/06/2013 1443   LABBENZ NONE DETECTED 10/06/2013 1443   AMPHETMU NONE DETECTED 10/06/2013 1443   THCU NONE DETECTED  10/06/2013 1443   LABBARB NONE DETECTED 10/06/2013 1443    Alcohol Level:  Recent Labs Lab 10/06/13 1332  ETH <11   Urinalysis:  Recent Labs Lab 10/06/13 1443  COLORURINE YELLOW  LABSPEC 1.043*  PHURINE 6.5  GLUCOSEU >1000*  HGBUR NEGATIVE  BILIRUBINUR NEGATIVE  KETONESUR NEGATIVE  PROTEINUR NEGATIVE  UROBILINOGEN 1.0  NITRITE NEGATIVE  LEUKOCYTESUR NEGATIVE     Micro Results: No results found for this or any previous visit (from the past 240 hour(s)). Studies/Results: Ct Angio Head W/cm &/or Wo Cm  10/06/2013   CLINICAL DATA:  Sudden aphasia.  Left-sided weakness. Headache.  EXAM: CT ANGIOGRAPHY HEAD AND NECK  TECHNIQUE: Multidetector CT imaging of the head and neck was performed using the standard protocol during bolus administration of intravenous contrast. Multiplanar CT image reconstructions including MIPs were obtained to evaluate the vascular anatomy. Carotid stenosis measurements (when applicable) are obtained utilizing NASCET criteria, using the distal internal carotid diameter as the denominator.  CONTRAST:  OMNIPAQUE IOHEXOL 350 MG/ML SOLN  COMPARISON:  CT head from the same day.  MRI brain 12/16/2010.  FINDINGS: CTA HEAD FINDINGS  Subcortical white matter disease is similar to the prior exam. The cortex appears  intact. No acute cortical infarct is evident. There is no hemorrhage or mass lesion. No enhancing lesions are evident.  Mild atherosclerotic calcifications are evident within the cavernous carotid arteries. The A1 and M1 segments are normal. The anterior communicating artery is patent. The MCA bifurcations are unremarkable. There is a proximal focal stenosis within the superior right M2 division. There is mild attenuation of distal MCA branch vessels in this distribution. No aneurysm is present.  The left vertebral artery is the dominant vessel. The basilar artery is normal. Both posterior cerebral arteries originate from the basilar tip. The dural venous  structures fill normally.  Review of the MIP images confirms the above findings.  CTA NECK FINDINGS  A standard 3 vessel arch configuration is present.  The vertebral arteries both originate from the subclavian arteries. The left vertebral artery is the dominant vessel. There is moderate artifact across the neck base secondary to the patient's pacemaker and body habitus. No significant vertebral arteries stenoses are present.  The right common carotid artery is within normal limits. The bifurcation is unremarkable. There is tortuosity of the cervical right ICA without a significant stenosis.  The left common carotid artery is tortuous. The bifurcation is unremarkable. Tortuosity of the cervical left ICA is present without a significant stenosis.  There is reversal of normal cervical lordosis and with multilevel endplate changes. No focal lytic or blastic bone lesion is present.  Review of the MIP images confirms the above findings.  IMPRESSION: CTA HEAD IMPRESSION  1. Focal high-grade stenosis of the superior division right M2. Ischemic changes in this distribution could result in the patient's symptoms. 2. No other significant vascular lesion. 3. No cortical infarct is evident. No item stable white matter disease.  CTA NECK IMPRESSION  1. Mild tortuosity of the cervical internal carotid arteries bilaterally as well as the left common carotid artery. 2. No significant vascular stenosis in the neck. 3. Cervical spondylosis is advanced for age.   Electronically Signed   By: Gennette Pac M.D.   On: 10/06/2013 14:57   Ct Head Wo Contrast  10/06/2013   CLINICAL DATA:  Code stroke, nonverbal, left-sided facial numbness, left arm weakness  EXAM: CT HEAD WITHOUT CONTRAST  TECHNIQUE: Contiguous axial images were obtained from the base of the skull through the vertex without intravenous contrast.  COMPARISON:  12/16/2010  FINDINGS: Calvarium is intact. No hemorrhage or extra-axial fluid. No evidence of hydrocephalus or  mass. No vascular territory infarct appreciated.  IMPRESSION: Negative CT of the head. Critical Value/emergent results were called by telephone at the time of interpretation on 10/06/2013 at 1:54 PM to Dr.Reynolds, who verbally acknowledged these results.   Electronically Signed   By: Esperanza Heir M.D.   On: 10/06/2013 13:55   Ct Angio Neck W/cm &/or Wo/cm  10/06/2013   CLINICAL DATA:  Sudden aphasia.  Left-sided weakness. Headache.  EXAM: CT ANGIOGRAPHY HEAD AND NECK  TECHNIQUE: Multidetector CT imaging of the head and neck was performed using the standard protocol during bolus administration of intravenous contrast. Multiplanar CT image reconstructions including MIPs were obtained to evaluate the vascular anatomy. Carotid stenosis measurements (when applicable) are obtained utilizing NASCET criteria, using the distal internal carotid diameter as the denominator.  CONTRAST:  OMNIPAQUE IOHEXOL 350 MG/ML SOLN  COMPARISON:  CT head from the same day.  MRI brain 12/16/2010.  FINDINGS: CTA HEAD FINDINGS  Subcortical white matter disease is similar to the prior exam. The cortex appears intact. No acute cortical infarct is  evident. There is no hemorrhage or mass lesion. No enhancing lesions are evident.  Mild atherosclerotic calcifications are evident within the cavernous carotid arteries. The A1 and M1 segments are normal. The anterior communicating artery is patent. The MCA bifurcations are unremarkable. There is a proximal focal stenosis within the superior right M2 division. There is mild attenuation of distal MCA branch vessels in this distribution. No aneurysm is present.  The left vertebral artery is the dominant vessel. The basilar artery is normal. Both posterior cerebral arteries originate from the basilar tip. The dural venous structures fill normally.  Review of the MIP images confirms the above findings.  CTA NECK FINDINGS  A standard 3 vessel arch configuration is present.  The vertebral  arteries both originate from the subclavian arteries. The left vertebral artery is the dominant vessel. There is moderate artifact across the neck base secondary to the patient's pacemaker and body habitus. No significant vertebral arteries stenoses are present.  The right common carotid artery is within normal limits. The bifurcation is unremarkable. There is tortuosity of the cervical right ICA without a significant stenosis.  The left common carotid artery is tortuous. The bifurcation is unremarkable. Tortuosity of the cervical left ICA is present without a significant stenosis.  There is reversal of normal cervical lordosis and with multilevel endplate changes. No focal lytic or blastic bone lesion is present.  Review of the MIP images confirms the above findings.  IMPRESSION: CTA HEAD IMPRESSION  1. Focal high-grade stenosis of the superior division right M2. Ischemic changes in this distribution could result in the patient's symptoms. 2. No other significant vascular lesion. 3. No cortical infarct is evident. No item stable white matter disease.  CTA NECK IMPRESSION  1. Mild tortuosity of the cervical internal carotid arteries bilaterally as well as the left common carotid artery. 2. No significant vascular stenosis in the neck. 3. Cervical spondylosis is advanced for age.   Electronically Signed   By: Gennette Pac M.D.   On: 10/06/2013 14:57   Medications: I have reviewed the patient's current medications. Scheduled Meds: . aspirin EC  81 mg Oral Daily  . citalopram  20 mg Oral Daily  . digoxin  0.125 mg Oral Daily  . enoxaparin (LOVENOX) injection  60 mg Subcutaneous Q24H  . furosemide  40 mg Oral BID  . hydrALAZINE  37.5 mg Oral TID  . insulin aspart  0-9 Units Subcutaneous TID WC  . insulin glargine  10 Units Subcutaneous QHS  . isosorbide mononitrate  60 mg Oral Daily  . metoprolol succinate  50 mg Oral Daily  . pantoprazole  40 mg Oral Daily  . simvastatin  40 mg Oral q1800  . sodium  chloride  3 mL Intravenous Q12H  . spironolactone  12.5 mg Oral Daily   Continuous Infusions:  PRN Meds:.albuterol Assessment/Plan: # Aphasia, questionable Acute Encephalopathy, Headache  Etiology unclear, possible TIA but neurology has evaluated and does not want further workup.  Possibly conversion disorder secondary to social stressors.  Unlikely hypoglycemic as it lasted ~3hrs and BG on admission was >300.  Unlikely arrhythmia as it lasted ~3hrs and no history of ICD firing.  ACS was ruled out with cardiac enzymes.  Patient will be discharged home, instructed to return to ED if neurologic symptoms return.  Will follow up with PCP in 1 week, will follow up with Neurology as needed. # Chronic combined systolic and diastolic heart failure  - Does not appear to be volume overloaded on exam. No  SOB.  -troponins negative -Continue home meds # ESSENTIAL HYPERTENSION  -Lasix  -Hydralazine 37.5mg  TID  -Metoprolol  # Type 2 diabetes mellitus, uncontrolled, with neuropathy  -Monitor CBGs  - Will hold metformin during hospital stay and for 48 hours after contrast.  Creatine unchanged -Lantus 10units QHS  -SSI  DVT PPx: Lovenox  Dispo: Disposition is deferred at this time, awaiting improvement of current medical problems. Anticipated discharge in approximately 1 day(s).  The patient does have a current PCP Courtney Paris, MD) and does need an [facility] hospital follow-up appointment after discharge.  The patient does not have transportation limitations that hinder transportation to clinic appointments.   .Services Needed at time of discharge: Y = Yes, Blank = No PT:   OT:   RN:   Equipment:   Other:     LOS: 1 day   Caitlyn Purl, DO 10/07/2013, 10:29 AM

## 2013-10-07 NOTE — Discharge Summary (Signed)
Name: [name] MRN: [id number] DOB: 1966-07-20 47 y.o. PCP: Courtney Paris, MD  Date of Admission: 10/06/2013  1:51 PM Date of Discharge: 10/07/2013 Attending Physician: Ginnie Smart, MD  Discharge Diagnosis:   Aphasia   Headache(784.0)   ESSENTIAL HYPERTENSION   OSA (obstructive sleep apnea)   Type 2 diabetes mellitus, uncontrolled, with neuropathy   Chronic combined systolic and diastolic heart failure    Discharge Medications:   Medication List         ACCU-CHEK FASTCLIX LANCETS Misc  1 each by Does not apply route 3 (three) times daily. USE UP TO 3 TIMES DAILY TO CHECK BLOOD SUGAR. DIAG CODE 250.00. INSULIN DEPENDENT     ACCU-CHEK NANO SMARTVIEW W/DEVICE Kit  1 each by Does not apply route 3 (three) times daily with meals.     albuterol 108 (90 BASE) MCG/ACT inhaler  Commonly known as:  PROVENTIL HFA;VENTOLIN HFA  Inhale 2 puffs into the lungs every 6 (six) hours as needed for wheezing or shortness of breath. For wheezing     aspirin EC 81 MG tablet  Take 81 mg by mouth every morning.     citalopram 20 MG tablet  Commonly known as:  CELEXA  Take 20 mg by mouth every morning.     cyclobenzaprine 10 MG tablet  Commonly known as:  FLEXERIL  Take 10 mg by mouth 3 (three) times daily as needed for muscle spasms.     diclofenac sodium 1 % Gel  Commonly known as:  VOLTAREN  Apply 2 g topically 4 (four) times daily as needed (for pain).     digoxin 0.125 MG tablet  Commonly known as:  LANOXIN  Take 1 tablet (0.125 mg total) by mouth daily.     furosemide 40 MG tablet  Commonly known as:  LASIX  Take 40 mg by mouth 2 (two) times daily.     glucose blood test strip  Commonly known as:  ACCU-CHEK SMARTVIEW  Use to check blood sugar 3 times per day     hydrALAZINE 25 MG tablet  Commonly known as:  APRESOLINE  Take 1.5 tablets (37.5 mg total) by mouth 3 (three) times daily.     ibuprofen 200 MG tablet  Commonly known as:  ADVIL,MOTRIN  Take 600  mg by mouth every 6 (six) hours as needed for pain.     insulin aspart 100 UNIT/ML injection  Commonly known as:  NOVOLOG FLEXPEN  Inject 5 Units into the skin 3 (three) times daily before meals.     Insulin Pen Needle 31G X 5 MM Misc  Use to inject insulin 4 times a day. Dx code: 78.62.Insulin dependent.     isosorbide mononitrate 60 MG 24 hr tablet  Commonly known as:  IMDUR  Take 1 tablet (60 mg total) by mouth daily.     metoprolol succinate 50 MG 24 hr tablet  Commonly known as:  TOPROL-XL  Take 1 tablet (50 mg total) by mouth daily.     oxyCODONE-acetaminophen 10-325 MG per tablet  Commonly known as:  PERCOCET  Take 1 tablet by mouth every 8 (eight) hours as needed for pain. For pain     pantoprazole 40 MG tablet  Commonly known as:  PROTONIX  Take 40 mg by mouth daily.     ROLLATOR Misc  Use daily for ambulation     spironolactone 25 MG tablet  Commonly known as:  ALDACTONE  Take 0.5 tablets (12.5 mg total) by mouth  daily.        Disposition and follow-up:   [name] was discharged from [facility] in Good condition.  At the hospital follow up visit please address:  1.  Continued resolution of symptoms of aphasia, compliance with medications, assess for depression/anxiety social situation.  2.  Labs / imaging needed at time of follow-up: None  3.  Pending labs/ test needing follow-up: None  Follow-up Appointments:   Discharge Instructions:      Future Appointments Provider Department Dept Phone   10/16/2013 9:00 AM [facility] [place] [facility] [phone]   11/02/2013 9:40 AM [facility] [place] [facility] [phone]   11/23/2013 11:45 AM [name], MD [facility] [place] Office [phone]   11/27/2013 9:20 AM Ranelle Oyster, MD [facility] [phone]      Consultations: Treatment Team:   Kym Groom, MD  Procedures Performed:  Ct Angio Head W/cm &/or Wo Cm  10/06/2013   CLINICAL DATA:  Sudden aphasia.  Left-sided weakness. Headache.  EXAM: CT ANGIOGRAPHY HEAD AND NECK  TECHNIQUE: Multidetector CT imaging of the head and neck was performed using the standard protocol during bolus administration of intravenous contrast. Multiplanar CT image reconstructions including MIPs were obtained to evaluate the vascular anatomy. Carotid stenosis measurements (when applicable) are obtained utilizing NASCET criteria, using the distal internal carotid diameter as the denominator.  CONTRAST:  OMNIPAQUE IOHEXOL 350 MG/ML SOLN  COMPARISON:  CT head from the same day.  MRI brain 12/16/2010.  FINDINGS: CTA HEAD FINDINGS  Subcortical white matter disease is similar to the prior exam. The cortex appears intact. No acute cortical infarct is evident. There is no hemorrhage or mass lesion. No enhancing lesions are evident.  Mild atherosclerotic calcifications are evident within the cavernous carotid arteries. The A1 and M1 segments are normal. The anterior communicating artery is patent. The MCA bifurcations are unremarkable. There is a proximal focal stenosis within the superior right M2 division. There is mild attenuation of distal MCA branch vessels in this distribution. No aneurysm is present.  The left vertebral artery is the dominant vessel. The basilar artery is normal. Both posterior cerebral arteries originate from the basilar tip. The dural venous structures fill normally.  Review of the MIP images confirms the above findings.  CTA NECK FINDINGS  A standard 3 vessel arch configuration is present.  The vertebral arteries both originate from the subclavian arteries. The left vertebral artery is the dominant vessel. There is moderate artifact across the neck base secondary to the patient's pacemaker and body habitus. No significant vertebral arteries stenoses are present.  The right common carotid  artery is within normal limits. The bifurcation is unremarkable. There is tortuosity of the cervical right ICA without a significant stenosis.  The left common carotid artery is tortuous. The bifurcation is unremarkable. Tortuosity of the cervical left ICA is present without a significant stenosis.  There is reversal of normal cervical lordosis and with multilevel endplate changes. No focal lytic or blastic bone lesion is present.  Review of the MIP images confirms the above findings.  IMPRESSION: CTA HEAD IMPRESSION  1. Focal high-grade stenosis of the superior division right M2. Ischemic changes in this distribution could result in the patient's symptoms. 2. No other significant vascular lesion. 3. No cortical infarct is evident. No item stable white matter disease.  CTA NECK IMPRESSION  1. Mild tortuosity of the  cervical internal carotid arteries bilaterally as well as the left common carotid artery. 2. No significant vascular stenosis in the neck. 3. Cervical spondylosis is advanced for age.   Electronically Signed   By: Gennette Pac M.D.   On: 10/06/2013 14:57   Dg Chest 2 View  09/24/2013   CLINICAL DATA:  Chest pain  EXAM: CHEST  2 VIEW  COMPARISON:  August 23, 2013  FINDINGS: Lungs are clear. Heart is mildly enlarged with normal pulmonary vascularity. Pacemaker leads are attached to the right atrium, right ventricle, and left ventricle. No adenopathy. No pneumothorax. No bone lesions.  IMPRESSION: Cardiomegaly. No edema or consolidation.   Electronically Signed   By: Bretta Bang M.D.   On: 09/24/2013 18:20   Ct Head Wo Contrast  10/06/2013   CLINICAL DATA:  Code stroke, nonverbal, left-sided facial numbness, left arm weakness  EXAM: CT HEAD WITHOUT CONTRAST  TECHNIQUE: Contiguous axial images were obtained from the base of the skull through the vertex without intravenous contrast.  COMPARISON:  12/16/2010  FINDINGS: Calvarium is intact. No hemorrhage or extra-axial fluid. No evidence of  hydrocephalus or mass. No vascular territory infarct appreciated.  IMPRESSION: Negative CT of the head. Critical Value/emergent results were called by telephone at the time of interpretation on 10/06/2013 at 1:54 PM to Dr.Reynolds, who verbally acknowledged these results.   Electronically Signed   By: Esperanza Heir M.D.   On: 10/06/2013 13:55   Ct Angio Neck W/cm &/or Wo/cm  10/06/2013   CLINICAL DATA:  Sudden aphasia.  Left-sided weakness. Headache.  EXAM: CT ANGIOGRAPHY HEAD AND NECK  TECHNIQUE: Multidetector CT imaging of the head and neck was performed using the standard protocol during bolus administration of intravenous contrast. Multiplanar CT image reconstructions including MIPs were obtained to evaluate the vascular anatomy. Carotid stenosis measurements (when applicable) are obtained utilizing NASCET criteria, using the distal internal carotid diameter as the denominator.  CONTRAST:  OMNIPAQUE IOHEXOL 350 MG/ML SOLN  COMPARISON:  CT head from the same day.  MRI brain 12/16/2010.  FINDINGS: CTA HEAD FINDINGS  Subcortical white matter disease is similar to the prior exam. The cortex appears intact. No acute cortical infarct is evident. There is no hemorrhage or mass lesion. No enhancing lesions are evident.  Mild atherosclerotic calcifications are evident within the cavernous carotid arteries. The A1 and M1 segments are normal. The anterior communicating artery is patent. The MCA bifurcations are unremarkable. There is a proximal focal stenosis within the superior right M2 division. There is mild attenuation of distal MCA branch vessels in this distribution. No aneurysm is present.  The left vertebral artery is the dominant vessel. The basilar artery is normal. Both posterior cerebral arteries originate from the basilar tip. The dural venous structures fill normally.  Review of the MIP images confirms the above findings.  CTA NECK FINDINGS  A standard 3 vessel arch configuration is present.  The  vertebral arteries both originate from the subclavian arteries. The left vertebral artery is the dominant vessel. There is moderate artifact across the neck base secondary to the patient's pacemaker and body habitus. No significant vertebral arteries stenoses are present.  The right common carotid artery is within normal limits. The bifurcation is unremarkable. There is tortuosity of the cervical right ICA without a significant stenosis.  The left common carotid artery is tortuous. The bifurcation is unremarkable. Tortuosity of the cervical left ICA is present without a significant stenosis.  There is reversal of normal cervical lordosis and with  multilevel endplate changes. No focal lytic or blastic bone lesion is present.  Review of the MIP images confirms the above findings.  IMPRESSION: CTA HEAD IMPRESSION  1. Focal high-grade stenosis of the superior division right M2. Ischemic changes in this distribution could result in the patient's symptoms. 2. No other significant vascular lesion. 3. No cortical infarct is evident. No item stable white matter disease.  CTA NECK IMPRESSION  1. Mild tortuosity of the cervical internal carotid arteries bilaterally as well as the left common carotid artery. 2. No significant vascular stenosis in the neck. 3. Cervical spondylosis is advanced for age.   Electronically Signed   By: Gennette Pac M.D.   On: 10/06/2013 14:57   Ct Angio Chest Pe W/cm &/or Wo Cm  09/24/2013   CLINICAL DATA:  Positive D-dimer. Chest pain  EXAM: CT ANGIOGRAPHY CHEST WITH CONTRAST  TECHNIQUE: Multidetector CT imaging of the chest was performed using the standard protocol during bolus administration of intravenous contrast. Multiplanar CT image reconstructions including MIPs were obtained to evaluate the vascular anatomy.  CONTRAST:  82mL OMNIPAQUE IOHEXOL 350 MG/ML SOLN  COMPARISON:  02/24/2013  FINDINGS: THORACIC INLET/BODY WALL:  No acute abnormality.  MEDIASTINUM:  Chronic cardiomegaly. No  pericardial effusion. Biventricular ICD/ pacer by left subclavian approach appears unremarkable. No evidence of central pulmonary embolism. Respiratory motion degrades evaluation beyond the lobar or proximal segmental pulmonary arteries. No acute aortic or great vessel abnormalities. No mediastinal adenopathy.  LUNG WINDOWS:  No consolidation.  No effusion.  No suspicious pulmonary nodule.  UPPER ABDOMEN:  No acute findings.  OSSEOUS:  No acute fracture.  No suspicious lytic or blastic lesions.  Review of the MIP images confirms the above findings.  IMPRESSION: 1. No evidence of pulmonary embolism. Sensitivity decreased by respiratory motion, limiting evaluation beyond the lobar or segmental pulmonary arteries (depending on level). 2. Cardiomegaly without failure.   Electronically Signed   By: Tiburcio Pea M.D.   On: 09/24/2013 22:15   Admission HPI: ARLETA OSTRUM is a 47 year old female with a PMH of CHF with recent placement of ICD, HTN, DMT2 (last A1c 9.2) w/ Neuropathy, OA, HLD. She presented to the ED today after a friend witnessed that around 12pm she was awake but not responsive to her. When EMS responded patient was able to walk to the ambulance but still unable to speak. In the ED she was reported to have some right facial numbness but otherwise no other focal neurologic deficit other than inability to phonate. A code stroke was called and patient was evaluated by Neurology and a head CT was obtained which was negative. A CTA of the head and neck was also obtained which showed a focal high grade stenosis of the right M2, and cervical spondylosis. Neurology did not feel this was cause of her symptoms and her symptoms resolved completely.  Of note patient reports she does not remember anything since around 12pm today until after she arrived at the ED. She also notes she has had headaches over the past week.   Hospital Course by problem list: # Aphasia, questionable Acute Encephalopathy, Headache    Patient presented after an episode of aphasia and right facial numbness, patient was evaluated by Neurology after a code stroke was called.  A head CT was negative and CTA of the head and neck showed a stenosis of the right M2 branch but unlikely to have caused her symptoms. Patient's symptoms had resolved completely by the time of admission.  Etiology of her symptoms was unclear, possible TIA but neurology did not feel this was likely. Possibly conversion disorder secondary to social stressors. Unlikely hypoglycemic as it lasted ~3hrs and BG on admission was >300. Unlikely arrhythmia as it lasted ~3hrs and no history of ICD firing. ACS was ruled out with cardiac enzymes.  Neurology felt that her symptoms were likely a consequence of her headache.   # Chronic combined systolic and diastolic heart failure  Patient did not appear to be volume overloaded on exam. No SOB.  Cardiac enzymes were negative as cause for patient's symptoms, her home medications were continued.  # ESSENTIAL HYPERTENSION Patient's home medications were continued  # Type 2 diabetes mellitus, uncontrolled, with neuropathy  Patient's home metformin was held due to IV contrast given, she was instructed to not take her dose for 1 day after discharge.  She was managed with 10 units QHS of Lantus and SSI.    Discharge Vitals:   BP 130/80  Pulse 85  Temp(Src) 98.1 F (36.7 C) (Oral)  Resp 18  Ht 5\' 9"  (1.753 m)  Wt 288 lb 3.2 oz (130.727 kg)  BMI 42.54 kg/m2  SpO2 99%  LMP 08/26/2013  Discharge Labs:  Results for orders placed during the hospital encounter of 10/06/13 (from the past 24 hour(s))  ETHANOL     Status: None   Collection Time    10/06/13  1:32 PM      Result Value Range   Alcohol, Ethyl (B) <11  0 - 11 mg/dL  PROTIME-INR     Status: None   Collection Time    10/06/13  1:32 PM      Result Value Range   Prothrombin Time 13.6  11.6 - 15.2 seconds   INR 1.06  0.00 - 1.49  APTT     Status: None    Collection Time    10/06/13  1:32 PM      Result Value Range   aPTT 31  24 - 37 seconds  CBC     Status: None   Collection Time    10/06/13  1:32 PM      Result Value Range   WBC 8.4  4.0 - 10.5 K/uL   RBC 4.38  3.87 - 5.11 MIL/uL   Hemoglobin 12.3  12.0 - 15.0 g/dL   HCT 91.4  78.2 - 95.6 %   MCV 85.8  78.0 - 100.0 fL   MCH 28.1  26.0 - 34.0 pg   MCHC 32.7  30.0 - 36.0 g/dL   RDW 21.3  08.6 - 57.8 %   Platelets 219  150 - 400 K/uL  DIFFERENTIAL     Status: None   Collection Time    10/06/13  1:32 PM      Result Value Range   Neutrophils Relative % 58  43 - 77 %   Neutro Abs 4.9  1.7 - 7.7 K/uL   Lymphocytes Relative 33  12 - 46 %   Lymphs Abs 2.8  0.7 - 4.0 K/uL   Monocytes Relative 7  3 - 12 %   Monocytes Absolute 0.6  0.1 - 1.0 K/uL   Eosinophils Relative 1  0 - 5 %   Eosinophils Absolute 0.1  0.0 - 0.7 K/uL   Basophils Relative 1  0 - 1 %   Basophils Absolute 0.0  0.0 - 0.1 K/uL  COMPREHENSIVE METABOLIC PANEL     Status: Abnormal   Collection Time    10/06/13  1:32  PM      Result Value Range   Sodium 130 (*) 135 - 145 mEq/L   Potassium 3.9  3.5 - 5.1 mEq/L   Chloride 95 (*) 96 - 112 mEq/L   CO2 20  19 - 32 mEq/L   Glucose, Bld 364 (*) 70 - 99 mg/dL   BUN 11  6 - 23 mg/dL   Creatinine, Ser 1.30  0.50 - 1.10 mg/dL   Calcium 8.7  8.4 - 86.5 mg/dL   Total Protein 7.5  6.0 - 8.3 g/dL   Albumin 3.7  3.5 - 5.2 g/dL   AST 13  0 - 37 U/L   ALT 11  0 - 35 U/L   Alkaline Phosphatase 70  39 - 117 U/L   Total Bilirubin 0.8  0.3 - 1.2 mg/dL   GFR calc non Af Amer >90  >90 mL/min   GFR calc Af Amer >90  >90 mL/min  TROPONIN I     Status: None   Collection Time    10/06/13  1:39 PM      Result Value Range   Troponin I <0.30  <0.30 ng/mL  POCT I-STAT TROPONIN I     Status: None   Collection Time    10/06/13  1:44 PM      Result Value Range   Troponin i, poc 0.00  0.00 - 0.08 ng/mL   Comment 3           POCT I-STAT, CHEM 8     Status: Abnormal   Collection Time     10/06/13  1:47 PM      Result Value Range   Sodium 136  135 - 145 mEq/L   Potassium 3.5  3.5 - 5.1 mEq/L   Chloride 99  96 - 112 mEq/L   BUN 10  6 - 23 mg/dL   Creatinine, Ser 7.84  0.50 - 1.10 mg/dL   Glucose, Bld 696 (*) 70 - 99 mg/dL   Calcium, Ion 2.95  2.84 - 1.23 mmol/L   TCO2 21  0 - 100 mmol/L   Hemoglobin 13.6  12.0 - 15.0 g/dL   HCT 13.2  44.0 - 10.2 %  GLUCOSE, CAPILLARY     Status: Abnormal   Collection Time    10/06/13  2:36 PM      Result Value Range   Glucose-Capillary 294 (*) 70 - 99 mg/dL  URINE RAPID DRUG SCREEN (HOSP PERFORMED)     Status: None   Collection Time    10/06/13  2:43 PM      Result Value Range   Opiates NONE DETECTED  NONE DETECTED   Cocaine NONE DETECTED  NONE DETECTED   Benzodiazepines NONE DETECTED  NONE DETECTED   Amphetamines NONE DETECTED  NONE DETECTED   Tetrahydrocannabinol NONE DETECTED  NONE DETECTED   Barbiturates NONE DETECTED  NONE DETECTED  URINALYSIS, ROUTINE W REFLEX MICROSCOPIC     Status: Abnormal   Collection Time    10/06/13  2:43 PM      Result Value Range   Color, Urine YELLOW  YELLOW   APPearance CLOUDY (*) CLEAR   Specific Gravity, Urine 1.043 (*) 1.005 - 1.030   pH 6.5  5.0 - 8.0   Glucose, UA >1000 (*) NEGATIVE mg/dL   Hgb urine dipstick NEGATIVE  NEGATIVE   Bilirubin Urine NEGATIVE  NEGATIVE   Ketones, ur NEGATIVE  NEGATIVE mg/dL   Protein, ur NEGATIVE  NEGATIVE mg/dL   Urobilinogen, UA 1.0  0.0 -  1.0 mg/dL   Nitrite NEGATIVE  NEGATIVE   Leukocytes, UA NEGATIVE  NEGATIVE  URINE MICROSCOPIC-ADD ON     Status: Abnormal   Collection Time    10/06/13  2:43 PM      Result Value Range   Squamous Epithelial / LPF RARE  RARE   WBC, UA 0-2  <3 WBC/hpf   RBC / HPF 0-2  <3 RBC/hpf   Bacteria, UA FEW (*) RARE  PREGNANCY, URINE     Status: None   Collection Time    10/06/13  2:43 PM      Result Value Range   Preg Test, Ur NEGATIVE  NEGATIVE  GLUCOSE, CAPILLARY     Status: Abnormal   Collection Time    10/06/13   9:25 PM      Result Value Range   Glucose-Capillary 212 (*) 70 - 99 mg/dL  TROPONIN I     Status: None   Collection Time    10/06/13  9:55 PM      Result Value Range   Troponin I <0.30  <0.30 ng/mL  TROPONIN I     Status: None   Collection Time    10/07/13  4:25 AM      Result Value Range   Troponin I <0.30  <0.30 ng/mL  BASIC METABOLIC PANEL     Status: Abnormal   Collection Time    10/07/13  4:25 AM      Result Value Range   Sodium 135  135 - 145 mEq/L   Potassium 4.1  3.5 - 5.1 mEq/L   Chloride 99  96 - 112 mEq/L   CO2 25  19 - 32 mEq/L   Glucose, Bld 242 (*) 70 - 99 mg/dL   BUN 11  6 - 23 mg/dL   Creatinine, Ser 5.62  0.50 - 1.10 mg/dL   Calcium 8.9  8.4 - 13.0 mg/dL   GFR calc non Af Amer 83 (*) >90 mL/min   GFR calc Af Amer >90  >90 mL/min  GLUCOSE, CAPILLARY     Status: Abnormal   Collection Time    10/07/13  7:23 AM      Result Value Range   Glucose-Capillary 203 (*) 70 - 99 mg/dL    Signed: Carlynn Purl, DO 10/07/2013, 10:31 AM   Time Spent on Discharge: 40 minutes Services Ordered on Discharge: none Equipment Ordered on Discharge: none

## 2013-10-07 NOTE — Progress Notes (Signed)
UR Completed.  [name] Jane [phone] 10/07/2013  

## 2013-10-07 NOTE — H&P (Signed)
  Date: 10/07/2013  Patient name: [name]  Medical record number: [id number]  Date of birth: [birth year]   I have seen and evaluated [name] and discussed their care with the Residency Team.   Assessment and Plan: I have seen and evaluated the patient as outlined above. I agree with the formulated Assessment and Plan as detailed in the residents' admission note, with the following changes:   47 yo F with recent placment of ICD (due to CHF/NICM, EF 25-30%) on 08-22-13, DM2, HTN, comes to hospital after being found at home to be awake but not able to converse. In the ER she was able to write but not able to speak. She believes the episode lasted 3 hours. It has completely resolved.  She has not felt her ICD firing.  In ED she was found on CT angio to have a high grade stenosis but was seen by neuro who did not feel that this lesion corresponded to her sx.  Her WBC was normal, she was afebrile, her UDS was (-).  Soc: she is in the process of being forced to move.   Filed Vitals:   10/07/13 0730  BP: 130/80  Pulse: 85  Temp:   Resp: 18  MS: A + O x3 Eyes- no photophobia, EOMI, PERRL Mouth- without lesions Neck: non-tender, no LAN Chest: CTA Chest wall: her ICD is non-tender, non-fluctuant CV: RRR Abd: BS+, soft, nontender Extr: no edema Neruo: non-focal. Strength is 5/5 bilaterally. Coordination normal, normal light touch.   Labs of note: troponins (-) Ct angio: focal stenosis within the superior  right M2 division. There is mild attenuation of distal MCA branch  vessels in this distribution.  A/P Aphasia Appreciate neuro f/u.  She has been cleared for d/c by them. The etiology of her syndrome is unclear: could be an arrhythmia (would be very unusual to last 3 hours however. And her ICD did not fire by her hx). A hypoglycemic episode could appear similar to this but again would be unusual to last 3 hours. An as of yet undx neurologic condition is certainly in the  ddx. As well, a stress reaction could give this syndrome?  She will f/u in IM clinic.   Caitlyn Smart, MD 10/26/201410:16 AM

## 2013-10-07 NOTE — Progress Notes (Signed)
Pt provided with dc instructins and education. Pt verbalized understanding. Pt has no questions at thist ime. IV removed with tip intaact. Heart monitor cleaned and returend to front. No needs at this time. Pt leaving with family. Levonne Spiller, RN

## 2013-10-08 LAB — HIV ANTIBODY (ROUTINE TESTING W REFLEX): HIV: NONREACTIVE

## 2013-10-11 ENCOUNTER — Encounter: Payer: Self-pay | Admitting: Internal Medicine

## 2013-10-11 ENCOUNTER — Ambulatory Visit (INDEPENDENT_AMBULATORY_CARE_PROVIDER_SITE_OTHER): Payer: Medicaid Other | Admitting: Internal Medicine

## 2013-10-11 VITALS — BP 151/90 | HR 90 | Temp 97.7°F | Ht 69.0 in | Wt 282.6 lb

## 2013-10-11 DIAGNOSIS — R4701 Aphasia: Secondary | ICD-10-CM

## 2013-10-11 DIAGNOSIS — E1140 Type 2 diabetes mellitus with diabetic neuropathy, unspecified: Secondary | ICD-10-CM

## 2013-10-11 DIAGNOSIS — N3946 Mixed incontinence: Secondary | ICD-10-CM | POA: Insufficient documentation

## 2013-10-11 DIAGNOSIS — E1149 Type 2 diabetes mellitus with other diabetic neurological complication: Secondary | ICD-10-CM

## 2013-10-11 DIAGNOSIS — E1142 Type 2 diabetes mellitus with diabetic polyneuropathy: Secondary | ICD-10-CM

## 2013-10-11 DIAGNOSIS — I10 Essential (primary) hypertension: Secondary | ICD-10-CM

## 2013-10-11 LAB — GLUCOSE, CAPILLARY: Glucose-Capillary: 223 mg/dL — ABNORMAL HIGH (ref 70–99)

## 2013-10-11 MED ORDER — ATORVASTATIN CALCIUM 80 MG PO TABS: 80.0000 mg | ORAL_TABLET | Freq: Every day | ORAL | Status: DC

## 2013-10-11 MED ORDER — INSULIN GLARGINE 100 UNIT/ML SOLOSTAR PEN: 20.0000 [IU] | PEN_INJECTOR | Freq: Every day | SUBCUTANEOUS | Status: DC

## 2013-10-11 NOTE — Assessment & Plan Note (Signed)
Symptoms of aphasia have completely resolved

## 2013-10-11 NOTE — Assessment & Plan Note (Signed)
The patient notes symptoms of mixed stress and urge urinary incontinence.  She has not tried any lifestyle modification. Ditropan likely would not be a great first-line indication given its side effects of dizziness. -Encouraged patient to urinate on a set schedule -Encouraged patient to find the restroom when she first entered public places -Encouraged patient to try Kegel exercises, with instructions provided

## 2013-10-11 NOTE — Assessment & Plan Note (Addendum)
Lab Results  Component Value Date   HGBA1C 9.2 07/24/2013   HGBA1C 9.7 04/09/2013   HGBA1C 11.4* 12/07/2012     Assessment: Diabetes control: poor control (HgbA1C >9%) Progress toward A1C goal:  unchanged Comments: The patient's glucometer reveals blood sugars mostly in the 200s, with one outlier around 100. Since glucometer trend is relatively horizontal, will increase her Lantus from 16-20.  Plan: Medications:  Increase lantus from 16 to 20 units daily.  Continue novolog 5 TID. Home glucose monitoring: Frequency: 3 times a day Timing: before meals Instruction/counseling given: reminded to bring blood glucose meter & log to each visit Educational resources provided:   Self management tools provided:   Other plans: Increase intensity of statin from mod to high.  Stop simvastatin 40, start Atorvastatin 80

## 2013-10-11 NOTE — Assessment & Plan Note (Signed)
BP Readings from Last 3 Encounters:  10/11/13 151/90  10/07/13 130/80  10/03/13 142/102    Lab Results  Component Value Date   NA 135 10/07/2013   K 4.1 10/07/2013   CREATININE 0.83 10/07/2013    Assessment: Blood pressure control: mildly elevated Progress toward BP goal:  unchanged Comments: The patient's blood pressure is mildly elevated today, but she has not taken her blood pressure medications this morning, and the dose of her hydralazine was recently increased. As such we will not change her regimen today, but we'll recheck her blood pressure at her next visit.  Plan: Medications:  continue current medications Educational resources provided:   Self management tools provided:   Other plans: none

## 2013-10-11 NOTE — Patient Instructions (Signed)
General Instructions: For your diabetes: -increase your Lantus (long-acting insulin) to 20 units daily -continue your Novolog (short-acting insulin) at 5 units three times per day -we are changing your Simvastatin to Atorvastatin, to give you better cholesterol control  For your urinary incontinence, we will try to control your symptoms with lifestyle changes: -Set a bathroom schedule for yourself, going to the bathroom once every 2 hours -When you enter a public place, locate the restroom as soon as you enter the building -Perform Kegel exercises as described below  For your high blood pressure, make sure to take your blood pressure medications before your next visit, and we will re-measure this.  Please return for a follow-up visit in about 1 month.   Treatment Goals:  Goals (1 Years of Data) as of 10/11/13   None      Progress Toward Treatment Goals:  Treatment Goal 10/11/2013  Hemoglobin A1C at goal  Blood pressure unchanged    Self Care Goals & Plans:  Self Care Goal 08/20/2013  Manage my medications take my medicines as prescribed; refill my medications on time; bring my medications to every visit  Monitor my health keep track of my blood glucose  Eat healthy foods eat foods that are low in salt; eat baked foods instead of fried foods    Home Blood Glucose Monitoring 09/13/2013  Check my blood sugar once a day  When to check my blood sugar before meals     Care Management & Community Referrals:  Referral 10/11/2013  Referrals made for care management support none needed      Kegel Exercises The goal of Kegel exercises is to isolate and exercise your pelvic floor muscles. These muscles act as a hammock that supports the rectum, vagina, small intestine, and uterus. As the muscles weaken, the hammock sags and these organs are displaced from their normal positions. Kegel exercises can strengthen your pelvic floor muscles and help you to improve bladder and bowel  control, improve sexual response, and help reduce many problems and some discomfort during pregnancy. Kegel exercises can be done anywhere and at any time. HOW TO PERFORM KEGEL EXERCISES 1. Locate your pelvic floor muscles. To do this, squeeze (contract) the muscles that you use when you try to stop the flow of urine. You will feel a tightness in the vaginal area (women) and a tight lift in the rectal area (men and women). 2. When you begin, contract your pelvic muscles tight for 2 5 seconds, then relax them for 2 5 seconds. This is one set. Do 4 5 sets with a short pause in between. 3. Contract your pelvic muscles for 8 10 seconds, then relax them for 8 10 seconds. Do 4 5 sets. If you cannot contract your pelvic muscles for 8 10 seconds, try 5 7 seconds and work your way up to 8 10 seconds. Your goal is 4 5 sets of 10 contractions each day. Keep your stomach, buttocks, and legs relaxed during the exercises. Perform sets of both short and long contractions. Vary your positions. Perform these contractions 3 4 times per day. Perform sets while you are:   Lying in bed in the morning.  Standing at lunch.  Sitting in the late afternoon.  Lying in bed at night. You should do 40 50 contractions per day. Do not perform more Kegel exercises per day than recommended. Overexercising can cause muscle fatigue. Continue these exercises for for at least 15 20 weeks or as directed by your caregiver. Document  Released: 11/15/2012 Document Reviewed: 08/24/2012 [facility] Patient Information 2014 [place], [place].

## 2013-10-11 NOTE — Progress Notes (Signed)
HPI The patient is a 47 y.o. female with a history of HTN, OSA, DM2, presenting for a hospital follow-up for aphasia.  The patient was recently discharged from the hospital 10/26 for aphasia, thought to be due to conversion disorder vs possible TIA.  Symptoms of aphasia have completely resolved, though she still notes some left hip pain, s/p a fall on the day of hospital admission.  She describes the pain as a dull ache, worse when putting pressure on the area or when walking, though she is still able to walk around.  The patient has a history of HTN.  She hasn't taken her BP medications today, though she didn't take it last night.  She doesn't like taking her BP meds early in the morning, due to nausea when taking them on an empty stomach.  She takes Toprol XL, lasix, and hydralazine.  The patient also notes urinary incontinence.  She notes that when she needs to go to the bathroom, she can't get to the bathroom fast enough.  She notes rarely having leakage of urine with laughing.  She notes no dysuria, polyuria.  The patient has a history of DM.  Glucometer report reveals a range from 98 to 436, with an average of 232.  She is taking Novolog 5 TID, and Lantus 16.  She notes no episodes of hypoglycemia, and no symptoms of shakiness, sweatiness, or AMS.  With hyperglycemia, she notes no blurry vision, polyuria, polydipsia.  CBG is 223 today  ROS: General: no fevers, chills, changes in weight, changes in appetite Skin: no rash HEENT: no blurry vision, hearing changes, sore throat Pulm: no dyspnea, coughing, wheezing CV: no chest pain, palpitations, shortness of breath Abd: no abdominal pain, nausea/vomiting, diarrhea/constipation GU: no dysuria, hematuria, polyuria Ext: see HPI Neuro: no weakness, numbness, or tingling  Filed Vitals:   10/11/13 0858  BP: 151/90  Pulse: 90  Temp: 97.7 F (36.5 C)    PEX General: alert, cooperative, and in no apparent distress HEENT: pupils equal round  and reactive to light, vision grossly intact, oropharynx clear and non-erythematous  Neck: supple Lungs: clear to ascultation bilaterally, normal work of respiration, no wheezes, rales, ronchi Heart: regular rate and rhythm, no murmurs, gallops, or rubs Abdomen: soft, non-tender, non-distended, normal bowel sounds Extremities: no cyanosis, clubbing, or edema. Left hip without ttp, full ROM. Neurologic: alert & oriented X3, cranial nerves II-XII intact, strength 5/5 throughout, sensation intact to light touch  Current Outpatient Prescriptions on File Prior to Visit  Medication Sig Dispense Refill  . ACCU-CHEK FASTCLIX LANCETS MISC 1 each by Does not apply route 3 (three) times daily. USE UP TO 3 TIMES DAILY TO CHECK BLOOD SUGAR. DIAG CODE 250.00. INSULIN DEPENDENT  102 each  6  . albuterol (PROVENTIL HFA;VENTOLIN HFA) 108 (90 BASE) MCG/ACT inhaler Inhale 2 puffs into the lungs every 6 (six) hours as needed for wheezing or shortness of breath. For wheezing      . aspirin EC 81 MG tablet Take 81 mg by mouth every morning.      . Blood Glucose Monitoring Suppl (ACCU-CHEK NANO SMARTVIEW) W/DEVICE KIT 1 each by Does not apply route 3 (three) times daily with meals.  1 kit  0  . citalopram (CELEXA) 20 MG tablet Take 20 mg by mouth every morning.      . cyclobenzaprine (FLEXERIL) 10 MG tablet Take 10 mg by mouth 3 (three) times daily as needed for muscle spasms.      . diclofenac sodium (VOLTAREN)  1 % GEL Apply 2 g topically 4 (four) times daily as needed (for pain).      [name] digoxin (LANOXIN) 0.125 MG tablet Take 1 tablet (0.125 mg total) by mouth daily.  30 tablet  11  . furosemide (LASIX) 40 MG tablet Take 40 mg by mouth 2 (two) times daily.      [name] glucose blood (ACCU-CHEK SMARTVIEW) test strip Use to check blood sugar 3 times per day  100 each  12  . hydrALAZINE (APRESOLINE) 25 MG tablet Take 1.5 tablets (37.5 mg total) by mouth 3 (three) times daily.  140 tablet  11  . ibuprofen (ADVIL,MOTRIN) 200 MG  tablet Take 600 mg by mouth every 6 (six) hours as needed for pain.      [name] insulin aspart (NOVOLOG FLEXPEN) 100 UNIT/ML injection Inject 5 Units into the skin 3 (three) times daily before meals.  1 vial  11  . Insulin Glargine (LANTUS SOLOSTAR) 100 UNIT/ML SOPN Inject 16 Units into the skin at bedtime.  5 pen  11  . Insulin Pen Needle 31G X 5 MM MISC Use to inject insulin 4 times a day. Dx code: 40.62.Insulin dependent.  120 each  11  . isosorbide mononitrate (IMDUR) 60 MG 24 hr tablet Take 1 tablet (60 mg total) by mouth daily.  30 tablet  11  . metoprolol succinate (TOPROL-XL) 50 MG 24 hr tablet Take 1 tablet (50 mg total) by mouth daily.  30 tablet  3  . Misc. Devices (ROLLATOR) MISC Use daily for ambulation  1 each  0  . oxyCODONE-acetaminophen (PERCOCET) 10-325 MG per tablet Take 1 tablet by mouth every 8 (eight) hours as needed for pain. For pain  70 tablet  0  . pantoprazole (PROTONIX) 40 MG tablet Take 40 mg by mouth daily.      . simvastatin (ZOCOR) 40 MG tablet Take 40 mg by mouth daily at 6 PM.      . spironolactone (ALDACTONE) 25 MG tablet Take 0.5 tablets (12.5 mg total) by mouth daily.  90 tablet  3   No current facility-administered medications on file prior to visit.    Assessment/Plan

## 2013-10-12 NOTE — Progress Notes (Signed)
Case discussed with Dr. Brown at the time of the visit.  We reviewed the resident's history and exam and pertinent patient test results.  I agree with the assessment, diagnosis, and plan of care documented in the resident's note. 

## 2013-10-16 ENCOUNTER — Ambulatory Visit (HOSPITAL_COMMUNITY)
Admission: RE | Admit: 2013-10-16 | Discharge: 2013-10-16 | Disposition: A | Discharge status: Home / Self Care | Payer: Medicaid Other | Source: Ambulatory Visit | Attending: Internal Medicine | Admitting: Internal Medicine

## 2013-10-16 ENCOUNTER — Encounter (HOSPITAL_COMMUNITY): Payer: Self-pay

## 2013-10-16 DIAGNOSIS — I5042 Chronic combined systolic (congestive) and diastolic (congestive) heart failure: Secondary | ICD-10-CM | POA: Insufficient documentation

## 2013-10-16 DIAGNOSIS — R002 Palpitations: Secondary | ICD-10-CM | POA: Insufficient documentation

## 2013-10-16 MED ORDER — METOPROLOL SUCCINATE ER 50 MG PO TB24: 50.0000 mg | ORAL_TABLET | Freq: Two times a day (BID) | ORAL | Status: DC

## 2013-10-16 NOTE — Patient Instructions (Signed)
INCREASE metoprolol to 50 mg twice a day.  Your physician recommends that you schedule a follow-up appointment in: 3 weeks  Do the following things EVERYDAY: 1) Weigh yourself in the morning before breakfast. Write it down and keep it in a log. 2) Take your medicines as prescribed 3) Eat low salt foods-Limit salt (sodium) to 2000 mg per day.  4) Stay as active as you can everyday 5) Limit all fluids for the day to less than 2 liters 6)

## 2013-10-17 ENCOUNTER — Telehealth: Payer: Self-pay | Admitting: *Deleted

## 2013-10-17 NOTE — Telephone Encounter (Signed)
Pharmacy called for clarification of statin medication.  They have pt on both simvastatin and atorvastatin. Dr Manson Passey paged and stated pt should be on Lipitor 80 mg 1 daily.   Please clarify in EPIC

## 2013-10-18 NOTE — Telephone Encounter (Signed)
Thank you for this note.    Given her CV risk factors, particularly DM, guidelines recommend high intensity statin.  Atorvastatin 80 is high intensity, Simvastatin 40 is moderate intensity.  At our OV last week, we changed from simvastatin to atorvastatin.  However, at her CHF visit 11/4, this was changed back from Atorvastatin to Simvastatin, with reason of "formulary change".  My guess is this means that atorvastatin wasn't covered under her insurance.  Do you mind calling the pharmacy to clarify?  If Atorvastatin isn't covered, Simvastatin is fine.  But if both are the same price, I prefer Atorvastatin.

## 2013-10-18 NOTE — Addendum Note (Signed)
Addended by: Bufford Spikes on: 10/18/2013 11:32 AM   Modules accepted: Orders

## 2013-10-19 NOTE — Telephone Encounter (Signed)
Pharmacy called and state they have sent out Lipitor to pt. Cost of this med is not an issue.

## 2013-10-31 ENCOUNTER — Telehealth: Payer: Self-pay

## 2013-10-31 DIAGNOSIS — M1712 Unilateral primary osteoarthritis, left knee: Secondary | ICD-10-CM

## 2013-10-31 DIAGNOSIS — M47816 Spondylosis without myelopathy or radiculopathy, lumbar region: Secondary | ICD-10-CM

## 2013-10-31 MED ORDER — OXYCODONE-ACETAMINOPHEN 10-325 MG PO TABS: 1.0000 | ORAL_TABLET | Freq: Three times a day (TID) | ORAL | Status: DC | PRN

## 2013-10-31 NOTE — Telephone Encounter (Signed)
Patient is requesting a refill on Oxycodone. 

## 2013-10-31 NOTE — Telephone Encounter (Signed)
Script printed to be signed.  Will contact patient when ready.

## 2013-10-31 NOTE — Telephone Encounter (Signed)
Patient is aware prescription is ready for pick up.

## 2013-11-02 ENCOUNTER — Telehealth: Payer: Self-pay

## 2013-11-02 ENCOUNTER — Ambulatory Visit (HOSPITAL_COMMUNITY)
Admission: RE | Admit: 2013-11-02 | Discharge: 2013-11-02 | Disposition: A | Discharge status: Home / Self Care | Payer: Medicaid Other | Source: Ambulatory Visit | Attending: Internal Medicine | Admitting: Internal Medicine

## 2013-11-02 VITALS — BP 136/78 | HR 96 | Wt 282.5 lb

## 2013-11-02 DIAGNOSIS — I5042 Chronic combined systolic (congestive) and diastolic (congestive) heart failure: Secondary | ICD-10-CM

## 2013-11-02 DIAGNOSIS — I509 Heart failure, unspecified: Secondary | ICD-10-CM | POA: Insufficient documentation

## 2013-11-02 DIAGNOSIS — G4733 Obstructive sleep apnea (adult) (pediatric): Secondary | ICD-10-CM | POA: Insufficient documentation

## 2013-11-02 DIAGNOSIS — I5022 Chronic systolic (congestive) heart failure: Secondary | ICD-10-CM | POA: Insufficient documentation

## 2013-11-02 DIAGNOSIS — I10 Essential (primary) hypertension: Secondary | ICD-10-CM | POA: Insufficient documentation

## 2013-11-02 DIAGNOSIS — E669 Obesity, unspecified: Secondary | ICD-10-CM

## 2013-11-02 DIAGNOSIS — I428 Other cardiomyopathies: Secondary | ICD-10-CM | POA: Insufficient documentation

## 2013-11-02 MED ORDER — SPIRONOLACTONE 25 MG PO TABS: 25.0000 mg | ORAL_TABLET | Freq: Every day | ORAL | Status: DC

## 2013-11-02 MED ORDER — HYDRALAZINE HCL 50 MG PO TABS: 50.0000 mg | ORAL_TABLET | Freq: Three times a day (TID) | ORAL | Status: DC

## 2013-11-02 MED ORDER — CYCLOBENZAPRINE HCL 10 MG PO TABS: 10.0000 mg | ORAL_TABLET | Freq: Three times a day (TID) | ORAL | Status: DC | PRN

## 2013-11-02 NOTE — Telephone Encounter (Signed)
Flexeril refill sent to pharmacy. 

## 2013-11-02 NOTE — Progress Notes (Signed)
Patient ID: [name], female   DOB: 02/27/1966, 47 y.o.   MRN: [id number]   Weight Range  282 pounds  Baseline proBNP   EP: Dr Graciela Husbands PCP: Dr Yetta Barre or Dr. Lovenia Kim internal medicine  Angioedema with ACE-I  --Optivol--  HPI: 47 yo with history of morbid obesity, DM2, OSA on CPAP and combined CHF in the setting of poorly-controlled HTN. She was admitted in 05/2011 with chest pain and new LBBB and ended up getting a left heart cath, which showed no angiographic CAD. Renal artery dopplers were normal in 04/2012.   Was hospitalized May 2014 with acute on chronic combined systolic and diastolic CHF. EF from December 2013 was down to 40 to 45%.    Admitted to the hospital 10/13-10/14 for CP. CT of chest negative for PE. Her CEs were negative and hydralazine and IMDUR were increased to 37.5 mg TID and 60 mg daily.  Follow up: Last visit increased Toprol to 50 mg BID. Overall doing pretty good. Now has SSI and is getting kicked out of her house. Complains of SOB with minimal exertion. Denies CP, orthopnea or edema. Weight at home 276-282 lbs. Taking medications as prescribe. Following low salt diet and drinking less that 2L a day. Dizziness and headaches improving. SBP at home 150/111. Followed by [facility] and has a Banker.     ECHO 11/2012: 40-45% ECHO  07/2013: 20% RV normal ECHO    08/23/2013: EF 40-45%, diff HK ECHO   09/05/2013: EF 25-30%, diff HK   Labs 06/2013: proBNP 230, Cr 0.9, K 3.7          08/2013: K+ 3.9, Cr 0.9       09/2013: K 4.2, Cr 0.86, pro-BNP 718, dig level <0.3     10/07/13: K+ 4.1, Cr 0.83  ROS: All systems negative except as listed in HPI, PMH and Problem List.  Past Medical History  Diagnosis Date  . Hypertension     Poorly controlled. Has had HTN since age 72. Angioedema with ACEI.  24 Hr urine and renal arterial dopplers ordered . . . Never done  . Type II or unspecified type diabetes mellitus without mention of complication, uncontrolled DX: 2002  . NICM  (nonischemic cardiomyopathy)     EF 45-50% in 8/12, cath 6/12 showed normal coronaries, EF 50-55% by LV gram  . Morbid obesity   . Allergic rhinitis   . HLD (hyperlipidemia)   . OSA on CPAP     sleep study in 8/12 showed moderate to severe OSA requiring CPAP  . Chronic lower back pain     secondary to DJD, obsetiy, hip problems. Followed by Dr. Ivory Broad (pain management)  . 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 Chronic diastolic heart failure      Primarily diastolic CHF: Likely due to uncontrolled HTN. Last echo (8/12) with EF 45-50%, mild to moderate LVH with some asymmetric septal hypertrophy, RV normal size and systolic function. EF 50-55% by LV-gram in 6/12.   . Degeneration of lumbar or lumbosacral intervertebral disc   . Polyneuropathy in diabetes(357.2)   . Thoracic or lumbosacral neuritis or radiculitis, unspecified   . Calcifying tendinitis of shoulder   . LBBB (left bundle branch block)   . Chronic combined systolic and diastolic CHF (congestive heart failure)     EF 40-45% by echo 12/06/2012  . Coronary artery disease     questionable. LHC 05/2011 showing normal coronaries // Followed  at [facility], Dr. Shirlee Latch  . GERD (gastroesophageal reflux disease)     Current Outpatient Prescriptions  Medication Sig Dispense Refill  . ACCU-CHEK FASTCLIX LANCETS MISC 1 each by Does not apply route 3 (three) times daily. USE UP TO 3 TIMES DAILY TO CHECK BLOOD SUGAR. DIAG CODE 250.00. INSULIN DEPENDENT  102 each  6  . albuterol (PROVENTIL HFA;VENTOLIN HFA) 108 (90 BASE) MCG/ACT inhaler Inhale 2 puffs into the lungs every 6 (six) hours as needed for wheezing or shortness of breath. For wheezing      . aspirin EC 81 MG tablet Take 81 mg by mouth every morning.      . Blood Glucose Monitoring Suppl (ACCU-CHEK NANO SMARTVIEW) W/DEVICE KIT 1 each by Does not apply route 3 (three) times daily with meals.  1 kit  0  . citalopram (CELEXA) 20 MG tablet Take 20 mg by  mouth every morning.      . cyclobenzaprine (FLEXERIL) 10 MG tablet Take 10 mg by mouth 3 (three) times daily as needed for muscle spasms.      . diclofenac sodium (VOLTAREN) 1 % GEL Apply 2 g topically 4 (four) times daily as needed (for pain).      [name] digoxin (LANOXIN) 0.125 MG tablet Take 1 tablet (0.125 mg total) by mouth daily.  30 tablet  11  . furosemide (LASIX) 40 MG tablet Take 40 mg by mouth 2 (two) times daily.      [name] glucose blood (ACCU-CHEK SMARTVIEW) test strip Use to check blood sugar 3 times per day  100 each  12  . hydrALAZINE (APRESOLINE) 50 MG tablet Take 1 tablet (50 mg total) by mouth 3 (three) times daily.  90 tablet  6  . ibuprofen (ADVIL,MOTRIN) 200 MG tablet Take 600 mg by mouth every 6 (six) hours as needed for pain.      [name] insulin aspart (NOVOLOG FLEXPEN) 100 UNIT/ML injection Inject 5 Units into the skin 3 (three) times daily before meals.  1 vial  11  . Insulin Glargine (LANTUS SOLOSTAR) 100 UNIT/ML SOPN Inject 20 Units into the skin at bedtime.  5 pen  11  . Insulin Pen Needle 31G X 5 MM MISC Use to inject insulin 4 times a day. Dx code: 82.62.Insulin dependent.  120 each  11  . isosorbide mononitrate (IMDUR) 60 MG 24 hr tablet Take 1 tablet (60 mg total) by mouth daily.  30 tablet  11  . metoprolol succinate (TOPROL-XL) 50 MG 24 hr tablet Take 1 tablet (50 mg total) by mouth 2 (two) times daily.  60 tablet  3  . Misc. Devices (ROLLATOR) MISC Use daily for ambulation  1 each  0  . oxyCODONE-acetaminophen (PERCOCET) 10-325 MG per tablet Take 1 tablet by mouth every 8 (eight) hours as needed for pain. Must last 30 days. For pain  70 tablet  0  . pantoprazole (PROTONIX) 40 MG tablet Take 40 mg by mouth daily.      . simvastatin (ZOCOR) 40 MG tablet Take 40 mg by mouth daily at 6 PM.      . spironolactone (ALDACTONE) 25 MG tablet Take 1 tablet (25 mg total) by mouth daily.  90 tablet  3   No current facility-administered medications for this encounter.   Filed Vitals:    11/02/13 0943  BP: 136/78  Pulse: 96  Weight: 282 lb 8 oz (128.141 kg)  SpO2: 98%   PHYSICAL EXAM: General:  NAD. No resp difficulty; aid with her  HEENT: normal Neck: supple. Thick. Hard to see JVP, but does not appear elevated. Carotids 2+ bilaterally; no bruits. No lymphadenopathy appreciated.  Cor: PMI nonpalpable; Irregular rate & rhythm. No rubs, gallops or murmurs. Split s2 Lungs: clear Abdomen: obese soft, nontender, nondistended. No hepatosplenomegaly. No bruits or masses. Good bowel sounds. Extremities: no cyanosis, clubbing, rash, edema Neuro: alert & orientedx3, cranial nerves grossly intact. Moves all 4 extremities w/o difficulty. Affect pleasant.   ASSESSMENT:  1. Chronic systolic HF, NICM likely r/t HTN, EF 25-30% (08/2013) s/p CRT-D-[employer]tr[employer] - NYHA II-III symptoms. Optivol interrogate and fluid is trending up will have her take an extra 40 mg of lasix for 2 days.  - BP more controlled, however remains elevated. Her headaches and dizziness much improved. Will increase spiro to 25 mg daily and increase hydralazine to 50 gm TID. Check BMET next week.  - Continue BB, IMDUR, and digoxin at current doses. - She is not on ACE-I d/t angioedema - Continue [facility] for tele-monitoring and assistance with medications.  - Once BP controlled for 3 months will recheck ECHO.  - Reinforced the need and importance of daily weights, a low sodium diet, and fluid restriction (less than 2 L a day). Instructed to call the HF clinic if weight increases more than 3 lbs overnight or 5 lbs in a week.  2. Uncontrolled HTN - As above will increase hydralazine and spiro 3. Morbid obesity - Encouraged weight loss with portion control. Her activity per Oleta Mouse is increasing and praised her for being more active. She is active about 3-4 hours a day.   4. OSA on CPAP - Uses CPAP nightly.  Optivol interrogated: Pacing 100% a day. Avg V rate 105-110 bpm. Patient activity increasing 3-4 hrs per day.    F/U 1 month Aundria Rud NP-C 12:25 PM

## 2013-11-02 NOTE — Patient Instructions (Addendum)
Increase your hydralazine to 50 mg three times a day. You will take 2 tablets three times a day until you run out of your prescription and then your new prescription will be 50 mg tablets and you will then take 1 tablet three times a day.  Increase your spironolactone to 25 mg daily.  Take an extra lasix 40 mg for 2 days.  Start exercising more, doing a great job.  Have a good Thanksgiving.  Follow up 1 month.  Do the following things EVERYDAY: 1) Weigh yourself in the morning before breakfast. Write it down and keep it in a log. 2) Take your medicines as prescribed 3) Eat low salt foods-Limit salt (sodium) to 2000 mg per day.  4) Stay as active as you can everyday 5) Limit all fluids for the day to less than 2 liters 6)

## 2013-11-02 NOTE — Telephone Encounter (Signed)
Ok to fill   thanks

## 2013-11-02 NOTE — Telephone Encounter (Signed)
Patient is requesting a refill on Flexeril is this okay to refill?

## 2013-11-06 ENCOUNTER — Encounter: Payer: Self-pay | Admitting: Internal Medicine

## 2013-11-07 ENCOUNTER — Encounter (HOSPITAL_COMMUNITY): Payer: Medicaid Other

## 2013-11-19 ENCOUNTER — Telehealth (HOSPITAL_COMMUNITY): Payer: Self-pay | Admitting: Cardiology

## 2013-11-19 NOTE — Telephone Encounter (Signed)
[name] called with concerns of pt weight Fri 12/5 281.6 Sat 12/6 287 Sun 12/7 285.6 Sun 12/8 287

## 2013-11-20 NOTE — Telephone Encounter (Signed)
Spoke w/pt she has been unable to wt today as she is moving, she states she does seem to have fluid in her belly, no SOB, will take extra 40 mg lasix today and tomorrow and will let us know if not getting better

## 2013-11-23 ENCOUNTER — Encounter: Payer: Self-pay | Admitting: Internal Medicine

## 2013-11-23 ENCOUNTER — Ambulatory Visit (INDEPENDENT_AMBULATORY_CARE_PROVIDER_SITE_OTHER): Payer: Medicaid Other | Admitting: Internal Medicine

## 2013-11-23 VITALS — BP 167/93 | HR 91 | Ht 69.0 in | Wt 283.4 lb

## 2013-11-23 DIAGNOSIS — I10 Essential (primary) hypertension: Secondary | ICD-10-CM

## 2013-11-23 DIAGNOSIS — I5042 Chronic combined systolic (congestive) and diastolic (congestive) heart failure: Secondary | ICD-10-CM

## 2013-11-23 DIAGNOSIS — I428 Other cardiomyopathies: Secondary | ICD-10-CM

## 2013-11-23 DIAGNOSIS — Z4502 Encounter for adjustment and management of automatic implantable cardiac defibrillator: Secondary | ICD-10-CM

## 2013-11-23 DIAGNOSIS — Z95810 Presence of automatic (implantable) cardiac defibrillator: Secondary | ICD-10-CM

## 2013-11-23 DIAGNOSIS — I5022 Chronic systolic (congestive) heart failure: Secondary | ICD-10-CM

## 2013-11-23 LAB — MDC_IDC_ENUM_SESS_TYPE_INCLINIC
Battery Voltage: 3.06 V
Brady Statistic AP VP Percent: 0.01 %
Brady Statistic AS VP Percent: 98.17 %
Brady Statistic AS VS Percent: 1.82 %
Brady Statistic RA Percent Paced: 0.02 %
Brady Statistic RV Percent Paced: 35.75 %
Date Time Interrogation Session: 20141212184616
HighPow Impedance: 190 Ohm
HighPow Impedance: 51 Ohm
Lead Channel Impedance Value: 456 Ohm
Lead Channel Impedance Value: 475 Ohm
Lead Channel Pacing Threshold Amplitude: 0.625 V
Lead Channel Pacing Threshold Amplitude: 0.875 V
Lead Channel Pacing Threshold Pulse Width: 0.4 ms
Lead Channel Pacing Threshold Pulse Width: 0.4 ms
Lead Channel Sensing Intrinsic Amplitude: 16.125 mV
Lead Channel Sensing Intrinsic Amplitude: 2.875 mV
Lead Channel Setting Pacing Amplitude: 2 V
Lead Channel Setting Pacing Amplitude: 2 V
Lead Channel Setting Pacing Pulse Width: 0.4 ms
Lead Channel Setting Pacing Pulse Width: 0.4 ms
Zone Setting Detection Interval: 250 ms
Zone Setting Detection Interval: 300 ms

## 2013-11-23 MED ORDER — HYDRALAZINE HCL 100 MG PO TABS: 100.0000 mg | ORAL_TABLET | Freq: Three times a day (TID) | ORAL | Status: DC

## 2013-11-23 NOTE — Progress Notes (Signed)
Patient Care Team: Courtney Paris, MD as PCP - General   HPI  [name] is a 47 y.o. female Seen in followup for CRT-D implantation for nonischemic cardiac myopathy and left bundle branch block with congestive heart failure this was done 9/14  She has had intercurrent hospitalization for chest pain with a negative evaluation for pulmonary embolism and myocardial ischemia.  Her breathing has been significantly better since CRT.    Past Medical History  Diagnosis Date  . Hypertension     Poorly controlled. Has had HTN since age 85. Angioedema with ACEI.  24 Hr urine and renal arterial dopplers ordered . . . Never done  . Type II or unspecified type diabetes mellitus without mention of complication, uncontrolled DX: 2002  . NICM (nonischemic cardiomyopathy)     EF 45-50% in 8/12, cath 6/12 showed normal coronaries, EF 50-55% by LV gram  . Morbid obesity   . Allergic rhinitis   . HLD (hyperlipidemia)   . OSA on CPAP     sleep study in 8/12 showed moderate to severe OSA requiring CPAP  . Chronic lower back pain     secondary to DJD, obsetiy, hip problems. Followed by Dr. Ivory Broad (pain management)  . 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 Chronic diastolic heart failure      Primarily diastolic CHF: Likely due to uncontrolled HTN. Last echo (8/12) with EF 45-50%, mild to moderate LVH with some asymmetric septal hypertrophy, RV normal size and systolic function. EF 50-55% by LV-gram in 6/12.   . Degeneration of lumbar or lumbosacral intervertebral disc   . Polyneuropathy in diabetes(357.2)   . Thoracic or lumbosacral neuritis or radiculitis, unspecified   . Calcifying tendinitis of shoulder   . LBBB (left bundle branch block)   . Chronic combined systolic and diastolic CHF (congestive heart failure)     EF 40-45% by echo 12/06/2012  . Coronary artery disease     questionable. LHC 05/2011 showing normal coronaries // Followed at [facility]  Cardiology, Dr. Shirlee Latch  . GERD (gastroesophageal reflux disease)     Past Surgical History  Procedure Laterality Date  . Breast biopsies Bilateral 2011    patient reports benign results  . Tubal ligation  05/31/1985  . Hand surgery Left   . Shoulder surgery Right   . Dental surgery      tumors removed   . Cardiac catheterization  05/2011  . Pacemaker/defibrillator placed  08/22/13 Left 08/22/13    Current Outpatient Prescriptions  Medication Sig Dispense Refill  . ACCU-CHEK FASTCLIX LANCETS MISC 1 each by Does not apply route 3 (three) times daily. USE UP TO 3 TIMES DAILY TO CHECK BLOOD SUGAR. DIAG CODE 250.00. INSULIN DEPENDENT  102 each  6  . albuterol (PROVENTIL HFA;VENTOLIN HFA) 108 (90 BASE) MCG/ACT inhaler Inhale 2 puffs into the lungs every 6 (six) hours as needed for wheezing or shortness of breath. For wheezing      . aspirin EC 81 MG tablet Take 81 mg by mouth every morning.      . Blood Glucose Monitoring Suppl (ACCU-CHEK NANO SMARTVIEW) W/DEVICE KIT 1 each by Does not apply route 3 (three) times daily with meals.  1 kit  0  . citalopram (CELEXA) 20 MG tablet Take 20 mg by mouth every morning.      . cyclobenzaprine (FLEXERIL) 10 MG tablet Take 1 tablet (10 mg total) by mouth 3 (three)  times daily as needed for muscle spasms.  30 tablet  2  . diclofenac sodium (VOLTAREN) 1 % GEL Apply 2 g topically 4 (four) times daily as needed (for pain).      [name] digoxin (LANOXIN) 0.125 MG tablet Take 1 tablet (0.125 mg total) by mouth daily.  30 tablet  11  . furosemide (LASIX) 40 MG tablet Take 40 mg by mouth 2 (two) times daily.      [name] glucose blood (ACCU-CHEK SMARTVIEW) test strip Use to check blood sugar 3 times per day  100 each  12  . hydrALAZINE (APRESOLINE) 50 MG tablet Take 1 tablet (50 mg total) by mouth 3 (three) times daily.  90 tablet  6  . ibuprofen (ADVIL,MOTRIN) 200 MG tablet Take 600 mg by mouth every 6 (six) hours as needed for pain.      [name] insulin aspart (NOVOLOG FLEXPEN)  100 UNIT/ML injection Inject 5 Units into the skin 3 (three) times daily before meals.  1 vial  11  . Insulin Glargine (LANTUS SOLOSTAR) 100 UNIT/ML SOPN Inject 20 Units into the skin at bedtime.  5 pen  11  . Insulin Pen Needle 31G X 5 MM MISC Use to inject insulin 4 times a day. Dx code: 14.62.Insulin dependent.  120 each  11  . isosorbide mononitrate (IMDUR) 60 MG 24 hr tablet Take 1 tablet (60 mg total) by mouth daily.  30 tablet  11  . metoprolol succinate (TOPROL-XL) 50 MG 24 hr tablet Take 1 tablet (50 mg total) by mouth 2 (two) times daily.  60 tablet  3  . Misc. Devices (ROLLATOR) MISC Use daily for ambulation  1 each  0  . oxyCODONE-acetaminophen (PERCOCET) 10-325 MG per tablet Take 1 tablet by mouth every 8 (eight) hours as needed for pain. Must last 30 days. For pain  70 tablet  0  . pantoprazole (PROTONIX) 40 MG tablet Take 40 mg by mouth daily.      . simvastatin (ZOCOR) 40 MG tablet Take 40 mg by mouth daily at 6 PM.      . spironolactone (ALDACTONE) 25 MG tablet Take 1 tablet (25 mg total) by mouth daily.  90 tablet  3   No current facility-administered medications for this visit.    Allergies  Allergen Reactions  . Clindamycin Anaphylaxis and Swelling    Swelling of pain  . Enalapril Maleate Swelling  . Lisinopril Swelling    Review of Systems negative except from HPI and PMH  Physical Exam BP 167/93  Pulse 91  Ht 5\' 9"  (1.753 m)  Wt 283 lb 6.4 oz (128.549 kg)  BMI 41.83 kg/m2 Well developed and well nourished in no acute distress HENT normal E scleral and icterus clear Neck Supple JVP flat; carotids brisk and full Clear to ausculation  Regular rate and rhythm, no murmurs gallops or rub Soft with active bowel sounds No clubbing cyanosis none Edema Alert and oriented, grossly normal motor and sensory function Skin Warm and Dry    Assessment and  Plan

## 2013-11-23 NOTE — Assessment & Plan Note (Signed)
euvoliemic 

## 2013-11-23 NOTE — Assessment & Plan Note (Signed)
The patient's device was interrogated and the information was fully reviewed.  The device was reprogrammed to maximize longevity  

## 2013-11-23 NOTE — Assessment & Plan Note (Signed)
As above.

## 2013-11-23 NOTE — Patient Instructions (Signed)
Your physician has requested that you have an echocardiogram. Echocardiography is a painless test that uses sound waves to create images of your heart. It provides your doctor with information about the size and shape of your heart and how well your heart's chambers and valves are working. This procedure takes approximately one hour. There are no restrictions for this procedure.  Your physician has recommended you make the following change in your medication:  1) Increase Hydralazine to 100 mg three times daily  Remote monitoring is used to monitor your Pacemaker of ICD from home. This monitoring reduces the number of office visits required to check your device to one time per year. It allows [place] to keep an eye on the functioning of your device to ensure it is working properly. You are scheduled for a device check from home on 02/25/2014. You may send your transmission at any time that day. If you have a wireless device, the transmission will be sent automatically. After your physician reviews your transmission, you will receive a postcard with your next transmission date.  Your physician wants you to follow-up in: 9 months with Dr. Logan Bores will receive a reminder letter in the mail two months in advance. If you don't receive a letter, please call our office to schedule the follow-up appointment.

## 2013-11-23 NOTE — Assessment & Plan Note (Addendum)
On guideline directed medical therapy; however, her blood pressure significantly elevated. We'll increase her hydralazine today from 50--100 mg 3 times a day. I wonder if she has had sleep study  We will repeat her echocardiogram

## 2013-11-26 ENCOUNTER — Encounter: Payer: Self-pay | Admitting: Internal Medicine

## 2013-11-26 ENCOUNTER — Telehealth: Payer: Self-pay | Admitting: Internal Medicine

## 2013-11-26 ENCOUNTER — Encounter: Payer: Self-pay | Admitting: *Deleted

## 2013-11-26 NOTE — Telephone Encounter (Signed)
Letter written and left at front desk for pt to pick up. Pt agreeable to plan.

## 2013-11-26 NOTE — Telephone Encounter (Signed)
New message     Missed court date because she was here for an appt with Dr Graciela Husbands.   Need note for court saying she was here 11-23-13.  Need to pick up the note before this Wednesday.  Call pt when note is ready

## 2013-11-27 ENCOUNTER — Encounter: Payer: Medicaid Other | Attending: Physical Medicine & Rehabilitation | Admitting: Physical Medicine & Rehabilitation

## 2013-11-27 ENCOUNTER — Encounter: Payer: Self-pay | Admitting: Physical Medicine & Rehabilitation

## 2013-11-27 VITALS — BP 164/92 | HR 85 | Resp 16 | Ht 69.0 in | Wt 285.0 lb

## 2013-11-27 DIAGNOSIS — G4733 Obstructive sleep apnea (adult) (pediatric): Secondary | ICD-10-CM

## 2013-11-27 DIAGNOSIS — M1712 Unilateral primary osteoarthritis, left knee: Secondary | ICD-10-CM

## 2013-11-27 DIAGNOSIS — F32A Depression, unspecified: Secondary | ICD-10-CM

## 2013-11-27 DIAGNOSIS — M47816 Spondylosis without myelopathy or radiculopathy, lumbar region: Secondary | ICD-10-CM

## 2013-11-27 DIAGNOSIS — E1149 Type 2 diabetes mellitus with other diabetic neurological complication: Secondary | ICD-10-CM

## 2013-11-27 DIAGNOSIS — I509 Heart failure, unspecified: Secondary | ICD-10-CM

## 2013-11-27 DIAGNOSIS — F329 Major depressive disorder, single episode, unspecified: Secondary | ICD-10-CM

## 2013-11-27 DIAGNOSIS — F3289 Other specified depressive episodes: Secondary | ICD-10-CM | POA: Insufficient documentation

## 2013-11-27 DIAGNOSIS — I5042 Chronic combined systolic (congestive) and diastolic (congestive) heart failure: Secondary | ICD-10-CM | POA: Insufficient documentation

## 2013-11-27 DIAGNOSIS — Z5181 Encounter for therapeutic drug level monitoring: Secondary | ICD-10-CM | POA: Insufficient documentation

## 2013-11-27 DIAGNOSIS — M7918 Myalgia, other site: Secondary | ICD-10-CM

## 2013-11-27 DIAGNOSIS — M1710 Unilateral primary osteoarthritis, unspecified knee: Secondary | ICD-10-CM | POA: Insufficient documentation

## 2013-11-27 DIAGNOSIS — M47817 Spondylosis without myelopathy or radiculopathy, lumbosacral region: Secondary | ICD-10-CM | POA: Insufficient documentation

## 2013-11-27 DIAGNOSIS — Z79899 Other long term (current) drug therapy: Secondary | ICD-10-CM | POA: Insufficient documentation

## 2013-11-27 DIAGNOSIS — IMO0001 Reserved for inherently not codable concepts without codable children: Secondary | ICD-10-CM | POA: Insufficient documentation

## 2013-11-27 DIAGNOSIS — E1142 Type 2 diabetes mellitus with diabetic polyneuropathy: Secondary | ICD-10-CM

## 2013-11-27 DIAGNOSIS — E1140 Type 2 diabetes mellitus with diabetic neuropathy, unspecified: Secondary | ICD-10-CM

## 2013-11-27 MED ORDER — ALPRAZOLAM 0.5 MG PO TABS: 0.5000 mg | ORAL_TABLET | Freq: Every evening | ORAL | Status: DC | PRN

## 2013-11-27 MED ORDER — OXYCODONE-ACETAMINOPHEN 10-325 MG PO TABS: 1.0000 | ORAL_TABLET | Freq: Three times a day (TID) | ORAL | Status: DC | PRN

## 2013-11-27 MED ORDER — CARBAMAZEPINE 200 MG PO TABS: 200.0000 mg | ORAL_TABLET | Freq: Two times a day (BID) | ORAL | Status: DC

## 2013-11-27 NOTE — Patient Instructions (Signed)
CONTINUE TO WORK ON YOUR EXERCISE AND WEIGHT LOSS AS MUCH AS YOU CAN.

## 2013-11-27 NOTE — Progress Notes (Signed)
Subjective:    Patient ID: [name], female    DOB: July 01, 1966, 47 y.o.   MRN: [id number]  HPI   [name] is back about her chronic pain. She states that the colder weather affects her pain and impacts her sleep. She reports that her pacemaker was adjusted friday, (?higher base rate) which she feels has impacted her sleep and mood also. She feels that she has more difficulty getting sleep because she feels more restless and "wound up."  She has been without her tegretol for a few months as she ran out. I don't see that we were contacted about a renewal.   Cardiology continues to work on her CHF and volume mgt. Cardiology is looking into an LVAD  Pain Inventory Average Pain 8 Pain Right Now 8 My pain is constant  In the last 24 hours, has pain interfered with the following? General activity 5 Relation with others 5 Enjoyment of life 5 What TIME of day is your pain at its worst? varies Sleep (in general) Poor  Pain is worse with: some activites Pain improves with: medication Relief from Meds: 8  Mobility walk without assistance ability to climb steps?  no do you drive?  yes  Function employed # of hrs/week . I need assistance with the following:  meal prep, household duties and shopping  Neuro/Psych weakness numbness tingling trouble walking dizziness depression anxiety loss of taste or smell  Prior Studies Any changes since last visit?  no Had pacemaker implanted  Physicians involved in your care Any changes since last visit?  no   Family History  Problem Relation Age of Onset  . Heart disease Mother 29    Died of MI at age 30 yo  . Heart disease Paternal Grandmother     requiring pacemaker.  [name] Heart disease Paternal Grandfather 41    Died of MI at possibly age 39-53yo  . Stroke Paternal Grandfather   . Heart disease Father 72    MI age 56yo requiring stenting  . Kidney disease Mother     requiring dialysis  . Congestive Heart Failure  Mother   . Diabetes Father   . Diabetes Brother   . Heart disease Brother 76    MI at age 67 years old  . Breast cancer Paternal Aunt   . Breast cancer Maternal Grandmother    History   Social History  . Marital Status: Single    Spouse Name: N/A    Number of Children: 2  . Years of Education: 9th grade   Occupational History  . Unemployed     planning on getting disability   Social History Main Topics  . Smoking status: Never Smoker   . Smokeless tobacco: Never Used  . Alcohol Use: No  . Drug Use: No  . Sexual Activity: Yes    Birth Control/ Protection: Surgical   Other Topics Concern  . None   Social History Narrative   Lives in [place] with her son. Is able to read and write fluently in [place].   Past Surgical History  Procedure Laterality Date  . Breast biopsies Bilateral 2011    patient reports benign results  . Tubal ligation  05/31/1985  . Hand surgery Left   . Shoulder surgery Right   . Dental surgery      tumors removed   . Cardiac catheterization  05/2011  . Pacemaker/defibrillator placed  08/22/13 Left 08/22/13   Past Medical History  Diagnosis Date  . Hypertension  Poorly controlled. Has had HTN since age 2. Angioedema with ACEI.  24 Hr urine and renal arterial dopplers ordered . . . Never done  . Type II or unspecified type diabetes mellitus without mention of complication, uncontrolled DX: 2002  . NICM (nonischemic cardiomyopathy)     EF 45-50% in 8/12, cath 6/12 showed normal coronaries, EF 50-55% by LV gram  . Morbid obesity   . Allergic rhinitis   . HLD (hyperlipidemia)   . OSA on CPAP     sleep study in 8/12 showed moderate to severe OSA requiring CPAP  . Chronic lower back pain     secondary to DJD, obsetiy, hip problems. Followed by Dr. Ivory Broad (pain management)  . 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 Chronic diastolic heart failure      Primarily diastolic CHF: Likely due to uncontrolled HTN. Last echo  (8/12) with EF 45-50%, mild to moderate LVH with some asymmetric septal hypertrophy, RV normal size and systolic function. EF 50-55% by LV-gram in 6/12.   . Degeneration of lumbar or lumbosacral intervertebral disc   . Polyneuropathy in diabetes(357.2)   . Thoracic or lumbosacral neuritis or radiculitis, unspecified   . Calcifying tendinitis of shoulder   . LBBB (left bundle branch block)   . Chronic combined systolic and diastolic CHF (congestive heart failure)     EF 40-45% by echo 12/06/2012  . Coronary artery disease     questionable. LHC 05/2011 showing normal coronaries // Followed at [facility], Dr. Shirlee Latch  . GERD (gastroesophageal reflux disease)    BP 164/92  Pulse 85  Resp 16  Ht 5\' 9"  (1.753 m)  Wt 285 lb (129.275 kg)  BMI 42.07 kg/m2  SpO2 95%     Review of Systems  Constitutional: Positive for unexpected weight change.  Gastrointestinal: Positive for nausea and constipation.  Musculoskeletal: Positive for arthralgias, gait problem and myalgias.  Neurological: Positive for dizziness, weakness and numbness.  Psychiatric/Behavioral: Positive for dysphoric mood. The patient is nervous/anxious.   All other systems reviewed and are negative.       Objective:   Physical Exam  Constitutional: She is oriented to person, place, and time. She appears well-developed and well-nourished.  Obese --no change HENT:  Head: Normocephalic and atraumatic.  Nose: Nose normal.  Eyes: Conjunctivae and EOM are normal. Pupils are equal, round, and reactive to light.  Neck: Normal range of motion.  Cardiovascular: Normal rate and regular rhythm. Defib/pacer left upper chest wall, site clean  Pulmonary/Chest: Effort normal and breath sounds normal.  Abdominal: She exhibits no distension. There is no tenderness.  Musculoskeletal: Patient had pain with cervical movements particularly rotation and bending to the left. Left shoulder is painful with RTC maneuvers, apprehension test  is negative. Pacer site A little tender. Lumbar back: She exhibits decreased range of motion, tenderness, bony tenderness, pain and spasm She was unable to flex to 40 degrees and extend past 10 degrees. She struggled to stand after sitting. 1+ edema both lower ext.  Mild joint line tenderness bilaterally. She has bilateral valgus deformities at each knee, right more than left.  Both calves are tender with stretching and slightly with palpation.    Neurological: She is alert and oriented to person, place, and time. She has normal strength and normal reflexes. A sensory deficit (stocking glove both legs) is present. She displays a negative Romberg sign.  Psychiatric: She has a normal mood and affect. Her behavior is  normal. Judgment and thought content normal.  Assessment & Plan:   ASSESSMENT:  1. Chronic low back pain which is multifactorial. Recent MRI of the  back is notable for disc disease and facet hypertrophy. Most  involved level appeared to be L4-L5, where there is a shallow broad-  based disk herniation and facet arthropathy with mild stenosis.  2. Diabetic peripheral neuropathy.  3. Bilateral rotator cuff syndrome.  4. Morbid obesity.   5. Cardiomyopathy/HTN. It is quite obvious that she's feeling better given her weight loss and pacer.  6. 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  7. Cervical myofascial pain, left -sided predominantly--improved after TPI's  PLAN:  1. Resume tegretol at 200mg  bid which should help with her neuropathic pain and perhaps back pain.  2. Percocet 10/325 was refilled one q8 prn. #70.  3. Discussed basic activities such as good posture and technique with activities around the house. She will also work on her treadmill.   4. Will try low dose xanax at night to help with getting to rest. I gave her enough to use as needed (not every night) until she adjusts to her new pacer settings.   5. Cardiac mgt per Dr.  Jearld Pies, et al.  6. Continue Voltaren gel for knees.  7. I will see her back in 3 months' time. She will call me with any  problems or questions. 30 minutes of face to face patient care time were spent during this visit. All questions were encouraged and answered.

## 2013-11-28 ENCOUNTER — Other Ambulatory Visit (HOSPITAL_COMMUNITY): Payer: Self-pay | Admitting: Cardiology

## 2013-11-28 DIAGNOSIS — I5042 Chronic combined systolic (congestive) and diastolic (congestive) heart failure: Secondary | ICD-10-CM

## 2013-11-28 MED ORDER — METOPROLOL SUCCINATE ER 50 MG PO TB24: 50.0000 mg | ORAL_TABLET | Freq: Two times a day (BID) | ORAL | Status: DC

## 2013-11-28 NOTE — Telephone Encounter (Signed)
Voicemail stating Ulla Potash, NP Is unable to send RX through medicaid rx re sent with valid provider for [facility]

## 2013-11-30 ENCOUNTER — Other Ambulatory Visit: Payer: Self-pay

## 2013-11-30 MED ORDER — DICLOFENAC SODIUM 1 % TD GEL: 2.0000 g | Freq: Four times a day (QID) | TRANSDERMAL | Status: DC | PRN

## 2013-12-03 ENCOUNTER — Other Ambulatory Visit: Payer: Self-pay | Admitting: *Deleted

## 2013-12-03 MED ORDER — ALBUTEROL SULFATE HFA 108 (90 BASE) MCG/ACT IN AERS: 2.0000 | INHALATION_SPRAY | Freq: Four times a day (QID) | RESPIRATORY_TRACT | Status: DC | PRN

## 2013-12-03 MED ORDER — FUROSEMIDE 40 MG PO TABS: 40.0000 mg | ORAL_TABLET | Freq: Two times a day (BID) | ORAL | Status: DC

## 2013-12-03 MED ORDER — PANTOPRAZOLE SODIUM 40 MG PO TBEC: 40.0000 mg | DELAYED_RELEASE_TABLET | Freq: Every day | ORAL | Status: DC

## 2013-12-03 MED ORDER — CITALOPRAM HYDROBROMIDE 20 MG PO TABS: 20.0000 mg | ORAL_TABLET | Freq: Every morning | ORAL | Status: DC

## 2013-12-11 ENCOUNTER — Encounter (INDEPENDENT_AMBULATORY_CARE_PROVIDER_SITE_OTHER): Payer: Self-pay

## 2013-12-11 ENCOUNTER — Encounter: Payer: Self-pay | Admitting: Cardiovascular Disease

## 2013-12-11 ENCOUNTER — Ambulatory Visit (HOSPITAL_COMMUNITY): Payer: Medicaid Other | Attending: Cardiovascular Disease | Admitting: Radiology

## 2013-12-11 ENCOUNTER — Other Ambulatory Visit (HOSPITAL_COMMUNITY): Payer: Medicaid Other

## 2013-12-11 DIAGNOSIS — I5042 Chronic combined systolic (congestive) and diastolic (congestive) heart failure: Secondary | ICD-10-CM

## 2013-12-11 DIAGNOSIS — I428 Other cardiomyopathies: Secondary | ICD-10-CM | POA: Insufficient documentation

## 2013-12-11 DIAGNOSIS — I447 Left bundle-branch block, unspecified: Secondary | ICD-10-CM

## 2013-12-11 DIAGNOSIS — I2510 Atherosclerotic heart disease of native coronary artery without angina pectoris: Secondary | ICD-10-CM | POA: Insufficient documentation

## 2013-12-11 DIAGNOSIS — I509 Heart failure, unspecified: Secondary | ICD-10-CM | POA: Insufficient documentation

## 2013-12-11 DIAGNOSIS — I10 Essential (primary) hypertension: Secondary | ICD-10-CM | POA: Insufficient documentation

## 2013-12-11 DIAGNOSIS — I5022 Chronic systolic (congestive) heart failure: Secondary | ICD-10-CM

## 2013-12-11 DIAGNOSIS — E119 Type 2 diabetes mellitus without complications: Secondary | ICD-10-CM | POA: Insufficient documentation

## 2013-12-11 DIAGNOSIS — I079 Rheumatic tricuspid valve disease, unspecified: Secondary | ICD-10-CM | POA: Insufficient documentation

## 2013-12-11 DIAGNOSIS — I379 Nonrheumatic pulmonary valve disorder, unspecified: Secondary | ICD-10-CM | POA: Insufficient documentation

## 2013-12-11 DIAGNOSIS — E785 Hyperlipidemia, unspecified: Secondary | ICD-10-CM | POA: Insufficient documentation

## 2013-12-11 DIAGNOSIS — I059 Rheumatic mitral valve disease, unspecified: Secondary | ICD-10-CM | POA: Insufficient documentation

## 2013-12-11 NOTE — Progress Notes (Signed)
Echocardiogram performed.  

## 2013-12-12 ENCOUNTER — Encounter (HOSPITAL_COMMUNITY): Payer: Self-pay

## 2013-12-12 ENCOUNTER — Ambulatory Visit (HOSPITAL_COMMUNITY)
Admission: RE | Admit: 2013-12-12 | Discharge: 2013-12-12 | Disposition: A | Discharge status: Home / Self Care | Payer: Medicaid Other | Source: Ambulatory Visit | Attending: Internal Medicine | Admitting: Internal Medicine

## 2013-12-12 VITALS — BP 142/78 | HR 94 | Resp 18 | Wt 283.8 lb

## 2013-12-12 DIAGNOSIS — E669 Obesity, unspecified: Secondary | ICD-10-CM

## 2013-12-12 DIAGNOSIS — I10 Essential (primary) hypertension: Secondary | ICD-10-CM

## 2013-12-12 DIAGNOSIS — G4733 Obstructive sleep apnea (adult) (pediatric): Secondary | ICD-10-CM

## 2013-12-12 DIAGNOSIS — I5022 Chronic systolic (congestive) heart failure: Secondary | ICD-10-CM | POA: Insufficient documentation

## 2013-12-12 DIAGNOSIS — I5042 Chronic combined systolic (congestive) and diastolic (congestive) heart failure: Secondary | ICD-10-CM

## 2013-12-12 LAB — BASIC METABOLIC PANEL
CO2: 24 mEq/L (ref 19–32)
Calcium: 9.4 mg/dL (ref 8.4–10.5)
Creatinine, Ser: 0.64 mg/dL (ref 0.50–1.10)
GFR calc non Af Amer: >90 mL/min (ref >90)
Glucose, Bld: 302 mg/dL — ABNORMAL HIGH (ref 70–99)

## 2013-12-12 MED ORDER — AMLODIPINE BESYLATE 5 MG PO TABS: 5.0000 mg | ORAL_TABLET | Freq: Every day | ORAL | Status: DC

## 2013-12-12 NOTE — Progress Notes (Signed)
Patient ID: [name], female   DOB: 1966/04/25, 47 y.o.   MRN: [id number]   Weight Range  282 pounds  Baseline proBNP   EP: Dr Graciela Husbands PCP: Dr Yetta Barre or Dr. Lovenia Kim internal medicine  Angioedema with ACE-I  --Optivol--  HPI: 47 yo with history of morbid obesity, DM2, OSA on CPAP and combined CHF in the setting of poorly-controlled HTN. She was admitted in 05/2011 with chest pain and new LBBB and ended up getting a left heart cath, which showed no angiographic CAD. Renal artery dopplers were normal in 04/2012.   Was hospitalized May 2014 with acute on chronic combined systolic and diastolic CHF. EF from December 2013 was down to 40 to 45%.    Admitted to the hospital 10/13-10/14 for CP. CT of chest negative for PE. Her CEs were negative and hydralazine and IMDUR were increased to 37.5 mg TID and 60 mg daily.  Follow up: Last visit increased Cleda Daub to 25 mg daily and hydralazine to 25 mg TID, however BP remained elevated and saw Dr. Graciela Husbands and he increased to 100 mg TID. Doing well. Denies SOB, CP, or edema. Not wearing CPAP nightly cause she moved and couldn't find it. Very fatigued. Having trouble resting. Weight at home 283-285 lbs. Taking medications as prescribed. [facility] still following patient and has Bank[facility]. Still few headaches.  Reports following low salt diet and drinking less than 2L a day.  ECHO 11/2012: 40-45% ECHO  07/2013: 20% RV normal ECHO    08/23/2013: EF 40-45%, diff HK ECHO   09/05/2013: EF 25-30%, diff HK  ECHO  12/11/13: EF 30%, diff HK, RV normal  Labs 06/2013: proBNP 230, Cr 0.9, K 3.7          08/2013: K+ 3.9, Cr 0.9       09/2013: K 4.2, Cr 0.86, pro-BNP 718, dig level <0.3     10/07/13: K+ 4.1, Cr 0.83  ROS: All systems negative except as listed in HPI, PMH and Problem List.  Past Medical History  Diagnosis Date  . Hypertension     Poorly controlled. Has had HTN since age 86. Angioedema with ACEI.  24 Hr urine and renal arterial dopplers ordered . . .  Never done  . Type II or unspecified type diabetes mellitus without mention of complication, uncontrolled DX: 2002  . NICM (nonischemic cardiomyopathy)     EF 45-50% in 8/12, cath 6/12 showed normal coronaries, EF 50-55% by LV gram  . Morbid obesity   . Allergic rhinitis   . HLD (hyperlipidemia)   . OSA on CPAP     sleep study in 8/12 showed moderate to severe OSA requiring CPAP  . Chronic lower back pain     secondary to DJD, obsetiy, hip problems. Followed by Dr. Ivory Broad (pain management)  . 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 Chronic diastolic heart failure      Primarily diastolic CHF: Likely due to uncontrolled HTN. Last echo (8/12) with EF 45-50%, mild to moderate LVH with some asymmetric septal hypertrophy, RV normal size and systolic function. EF 50-55% by LV-gram in 6/12.   . Degeneration of lumbar or lumbosacral intervertebral disc   . Polyneuropathy in diabetes(357.2)   . Thoracic or lumbosacral neuritis or radiculitis, unspecified   . Calcifying tendinitis of shoulder   . LBBB (left bundle branch block)   . Chronic combined systolic and diastolic CHF (congestive heart failure)     EF  40-45% by echo 12/06/2012  . Coronary artery disease     questionable. LHC 05/2011 showing normal coronaries // Followed at [facility], Dr. Shirlee Latch  . GERD (gastroesophageal reflux disease)     Current Outpatient Prescriptions  Medication Sig Dispense Refill  . ACCU-CHEK FASTCLIX LANCETS MISC 1 each by Does not apply route 3 (three) times daily. USE UP TO 3 TIMES DAILY TO CHECK BLOOD SUGAR. DIAG CODE 250.00. INSULIN DEPENDENT  102 each  6  . albuterol (PROVENTIL HFA;VENTOLIN HFA) 108 (90 BASE) MCG/ACT inhaler Inhale 2 puffs into the lungs every 6 (six) hours as needed for wheezing or shortness of breath. For wheezing  1 Inhaler  5  . ALPRAZolam (XANAX) 0.5 MG tablet Take 1 tablet (0.5 mg total) by mouth at bedtime as needed for anxiety.  20 tablet  1  .  aspirin EC 81 MG tablet Take 81 mg by mouth every morning.      . Blood Glucose Monitoring Suppl (ACCU-CHEK NANO SMARTVIEW) W/DEVICE KIT 1 each by Does not apply route 3 (three) times daily with meals.  1 kit  0  . carbamazepine (TEGRETOL) 200 MG tablet Take 1 tablet (200 mg total) by mouth 2 (two) times daily.  60 tablet  5  . citalopram (CELEXA) 20 MG tablet Take 1 tablet (20 mg total) by mouth every morning.  30 tablet  5  . cyclobenzaprine (FLEXERIL) 10 MG tablet Take 1 tablet (10 mg total) by mouth 3 (three) times daily as needed for muscle spasms.  30 tablet  2  . diclofenac sodium (VOLTAREN) 1 % GEL Apply 2 g topically 4 (four) times daily as needed (for pain).  3 Tube  2  . digoxin (LANOXIN) 0.125 MG tablet Take 1 tablet (0.125 mg total) by mouth daily.  30 tablet  11  . furosemide (LASIX) 40 MG tablet Take 1 tablet (40 mg total) by mouth 2 (two) times daily.  60 tablet  5  . glucose blood (ACCU-CHEK SMARTVIEW) test strip Use to check blood sugar 3 times per day  100 each  12  . hydrALAZINE (APRESOLINE) 100 MG tablet Take 1 tablet (100 mg total) by mouth 3 (three) times daily.  90 tablet  10  . ibuprofen (ADVIL,MOTRIN) 200 MG tablet Take 600 mg by mouth every 6 (six) hours as needed for pain.      [name] insulin aspart (NOVOLOG FLEXPEN) 100 UNIT/ML injection Inject 5 Units into the skin 3 (three) times daily before meals.  1 vial  11  . Insulin Glargine (LANTUS SOLOSTAR) 100 UNIT/ML SOPN Inject 20 Units into the skin at bedtime.  5 pen  11  . Insulin Pen Needle 31G X 5 MM MISC Use to inject insulin 4 times a day. Dx code: 26.62.Insulin dependent.  120 each  11  . isosorbide mononitrate (IMDUR) 60 MG 24 hr tablet Take 1 tablet (60 mg total) by mouth daily.  30 tablet  11  . metoprolol succinate (TOPROL-XL) 50 MG 24 hr tablet Take 1 tablet (50 mg total) by mouth 2 (two) times daily.  60 tablet  3  . Misc. Devices (ROLLATOR) MISC Use daily for ambulation  1 each  0  . oxyCODONE-acetaminophen  (PERCOCET) 10-325 MG per tablet Take 1 tablet by mouth every 8 (eight) hours as needed for pain. Must last 30 days. For pain  70 tablet  0  . pantoprazole (PROTONIX) 40 MG tablet Take 1 tablet (40 mg total) by mouth daily.  30 tablet  5  . simvastatin (ZOCOR) 40 MG tablet Take 40 mg by mouth daily at 6 PM.      . spironolactone (ALDACTONE) 25 MG tablet Take 1 tablet (25 mg total) by mouth daily.  90 tablet  3  . amLODipine (NORVASC) 5 MG tablet Take 1 tablet (5 mg total) by mouth daily.  30 tablet  3   No current facility-administered medications for this encounter.   Filed Vitals:   12/12/13 1055  BP: 142/78  Pulse: 94  Resp: 18  Weight: 283 lb 12 oz (128.708 kg)  SpO2: 99%   PHYSICAL EXAM: General:  NAD. No resp difficulty; aid with her HEENT: normal Neck: supple. Thick. Hard to see JVP, but appears mildly elevated. Carotids 2+ bilaterally; no bruits. No lymphadenopathy appreciated.  Cor: PMI nonpalpable; Regular rate & rhythm. No rubs, gallops or murmurs. Split s2 Lungs: clear Abdomen: obese soft, nontender, mildly distended. No hepatosplenomegaly. No bruits or masses. Good bowel sounds. Extremities: no cyanosis, clubbing, rash, trace edema Neuro: alert & orientedx3, cranial nerves grossly intact. Moves all 4 extremities w/o difficulty. Affect pleasant.   ASSESSMENT:  1. Chronic systolic HF, NICM likely r/t HTN, EF 30% (11/2013) s/p CRT-D-[employer] - NYHA II symptoms. Volume status looks mildly elevated and on Optivol is trending up and thoracic impedence is down. Will have her take and extra 40 mg of lasix today and tomorrow and then go back to 40 mg BID. If fluid remains elevated next visit may need to increase lasix to 80 mg q am and 40 mg q pm. - BP seems a little more controlled, but still having some elevated readings on [facility] telemonitoring. Will start norvasc 5 mg daily and continue BB, hydralazine, spiro and IMRUR at current doses.  - Continue digoxin 0.125 mg, last level  09/2013 0.3 - She is not on ACE-I d/t angioedema - Check BMET today.  - Reviewed ECHO with patient from 12/11/13. EF remains 30% will continue to titrate medications.   - Reinforced the need and importance of daily weights, a low sodium diet, and fluid restriction (less than 2 L a day). Instructed to call the HF clinic if weight increases more than 3 lbs overnight or 5 lbs in a week.  2. Uncontrolled HTN - As above will start norvasc 5 mg daily.  3. Morbid obesity - Encouraged weight loss with portion control. Optivol activity level is increasing and is active for about 3-4 hours a day.  4. OSA on CPAP - Has not been using CPAP daily and have asked her to start using again. She mentioned she may need new mask and instructed her to call Dr. Shelle Iron.   Optivol interrogated: Pacing 100% a day. Avg V rate 90-100 bpm. Patient activity 3-4 hrs per day. No afib  F/U 2 months Ulla Potash B NP-C 2:35 PM

## 2013-12-12 NOTE — Patient Instructions (Signed)
Start taking amlodipine 5 mg daily.  Take an extra 40 mg lasix today and tomorrow.  Follow up 2 months  Do the following things EVERYDAY: 1) Weigh yourself in the morning before breakfast. Write it down and keep it in a log. 2) Take your medicines as prescribed 3) Eat low salt foods-Limit salt (sodium) to 2000 mg per day.  4) Stay as active as you can everyday 5) Limit all fluids for the day to less than 2 liters 6)

## 2013-12-13 DIAGNOSIS — I639 Cerebral infarction, unspecified: Secondary | ICD-10-CM | POA: Insufficient documentation

## 2013-12-13 HISTORY — DX: Cerebral infarction, unspecified: I63.9

## 2013-12-14 ENCOUNTER — Encounter: Payer: Self-pay | Admitting: Internal Medicine

## 2013-12-14 ENCOUNTER — Telehealth: Payer: Self-pay

## 2013-12-14 ENCOUNTER — Ambulatory Visit: Payer: Medicaid Other | Admitting: Internal Medicine

## 2013-12-14 ENCOUNTER — Telehealth: Payer: Self-pay | Admitting: *Deleted

## 2013-12-14 ENCOUNTER — Other Ambulatory Visit (HOSPITAL_COMMUNITY): Payer: Self-pay | Admitting: Surgery

## 2013-12-14 ENCOUNTER — Other Ambulatory Visit (HOSPITAL_COMMUNITY): Payer: Self-pay | Admitting: Cardiology

## 2013-12-14 DIAGNOSIS — I5042 Chronic combined systolic (congestive) and diastolic (congestive) heart failure: Secondary | ICD-10-CM

## 2013-12-14 MED ORDER — HYDRALAZINE HCL 100 MG PO TABS: 100.0000 mg | ORAL_TABLET | Freq: Three times a day (TID) | ORAL | Status: DC

## 2013-12-14 MED ORDER — HYDRALAZINE HCL 100 MG PO TABS
100.0000 mg | ORAL_TABLET | Freq: Three times a day (TID) | ORAL | Status: DC
Start: 2013-12-14 — End: 2014-01-15

## 2013-12-14 NOTE — Telephone Encounter (Signed)
Katherine Huemmer (Pharmacist) from [facility] called to inform you that they accidentally dispensed  the wrong RX on 09/18/13 to the patient. The medication was Gabapentin 600 mg #135 Take 1 1/2 tablets tid. The pharmacist just now realized they had made this mistake when the patient called to get a refill on the medication. Pharmacist was going to contact the patient and the PCP. If there is anything else the pharmacy needs to do they would like for you to let them know.

## 2013-12-14 NOTE — Telephone Encounter (Signed)
Pt's pharmacy calls and states 09/18/2013 pt was mistakenly sent gabapentin 600 mg, #135, 1 1/2 tablets 3 times daily. This was filled only once and pt states she has n

## 2013-12-14 NOTE — Telephone Encounter (Signed)
Please refer to prior message. Gabapentin was filled only once and pt states she has not taken any of the med. She will be returning the med to the pharmacy. The pharmacist needed to let you know

## 2013-12-17 ENCOUNTER — Encounter: Payer: Self-pay | Admitting: Internal Medicine

## 2013-12-17 NOTE — Telephone Encounter (Signed)
Left patient a voicemail to return call to clinic. 

## 2013-12-17 NOTE — Telephone Encounter (Signed)
Is the patient now taking this medication WITH the tegretol? Do we need to taper it? Is patient experiencing any problems as a result of the medication?

## 2013-12-18 ENCOUNTER — Ambulatory Visit: Payer: Medicaid Other | Admitting: Internal Medicine

## 2013-12-18 ENCOUNTER — Encounter: Payer: Self-pay | Admitting: Internal Medicine

## 2013-12-18 NOTE — Telephone Encounter (Signed)
Patient returned call to clinic. Patient stated she did not take the medication. [facility] realized the pharmacy had dispensed the wrong medication to her so she never took any of it and notified the pharmacy.

## 2013-12-19 ENCOUNTER — Encounter: Payer: Self-pay | Admitting: Internal Medicine

## 2013-12-19 ENCOUNTER — Ambulatory Visit (INDEPENDENT_AMBULATORY_CARE_PROVIDER_SITE_OTHER): Payer: Medicaid Other | Admitting: Internal Medicine

## 2013-12-19 VITALS — BP 136/92 | HR 82 | Temp 97.3°F | Ht 69.0 in | Wt 281.8 lb

## 2013-12-19 DIAGNOSIS — B3731 Acute candidiasis of vulva and vagina: Secondary | ICD-10-CM

## 2013-12-19 DIAGNOSIS — E1140 Type 2 diabetes mellitus with diabetic neuropathy, unspecified: Secondary | ICD-10-CM

## 2013-12-19 DIAGNOSIS — E1149 Type 2 diabetes mellitus with other diabetic neurological complication: Secondary | ICD-10-CM

## 2013-12-19 DIAGNOSIS — E1142 Type 2 diabetes mellitus with diabetic polyneuropathy: Secondary | ICD-10-CM

## 2013-12-19 DIAGNOSIS — E1165 Type 2 diabetes mellitus with hyperglycemia: Principal | ICD-10-CM

## 2013-12-19 DIAGNOSIS — N924 Excessive bleeding in the premenopausal period: Secondary | ICD-10-CM

## 2013-12-19 DIAGNOSIS — R8281 Pyuria: Secondary | ICD-10-CM

## 2013-12-19 DIAGNOSIS — R82998 Other abnormal findings in urine: Secondary | ICD-10-CM

## 2013-12-19 DIAGNOSIS — IMO0002 Reserved for concepts with insufficient information to code with codable children: Secondary | ICD-10-CM

## 2013-12-19 DIAGNOSIS — B373 Candidiasis of vulva and vagina: Secondary | ICD-10-CM

## 2013-12-19 LAB — POCT URINALYSIS DIPSTICK
Bilirubin, UA: NEGATIVE
Ketones, UA: NEGATIVE
Nitrite, UA: NEGATIVE
PH UA: 6.5
Protein, UA: NEGATIVE
RBC UA: NEGATIVE
Spec Grav, UA: 1.015
UROBILINOGEN UA: 1

## 2013-12-19 LAB — GLUCOSE, CAPILLARY: Glucose-Capillary: 258 mg/dL — ABNORMAL HIGH (ref 70–99)

## 2013-12-19 LAB — POCT GLYCOSYLATED HEMOGLOBIN (HGB A1C): Hemoglobin A1C: 10.7

## 2013-12-19 MED ORDER — INSULIN ASPART 100 UNIT/ML ~~LOC~~ SOLN: 12.0000 [IU] | Freq: Three times a day (TID) | SUBCUTANEOUS | Status: DC

## 2013-12-19 MED ORDER — INSULIN GLARGINE 100 UNIT/ML SOLOSTAR PEN: 27.0000 [IU] | PEN_INJECTOR | Freq: Every day | SUBCUTANEOUS | Status: DC

## 2013-12-19 MED ORDER — FLUCONAZOLE 150 MG PO TABS: 150.0000 mg | ORAL_TABLET | ORAL | Status: DC

## 2013-12-19 NOTE — Progress Notes (Signed)
Subjective:    Patient ID: [name], female    DOB: 1966/08/04, 48 y.o.   MRN: [id number]  HPI [name] 48 yo woman pmh as listed below comes in for DM and pyuria.   The patient states that in terms of her diabetes she has recently noticed increasing CBG readings over the past 2-3 months worse recently anywhere in the 400s she began self increasing her Lantus from 20 units each bedtime to 27 units each bedtime and also increase her NovoLog that was scheduled 5 units 3 times a day WC to 12-13 units 3 times a day with meals. He noticed a dramatic decrease in her CBG readings to 200s although she does not bring in her meter today. Has not no symptoms of hypoglycemia. This time when she was having elevated CBG she's having some vaginal itchiness and pyuria. She's not had any fevers or chills or suprapubic tenderness or abdominal pain. She's had some vaginal discharge but is also noticed significant changes in her menstrual pattern. She is having some missed periods, lites. And decreased length of her menstration. She is not sexually active and has no chance of being pregnant.    Review of Systems  Constitutional: Negative for fever, chills and fatigue.  HENT: Negative for congestion, postnasal drip, rhinorrhea, sinus pressure and sore throat.   Respiratory: Negative for cough and shortness of breath.   Cardiovascular: Negative for chest pain, palpitations and leg swelling.  Gastrointestinal: Negative for nausea, vomiting, abdominal pain, diarrhea and constipation.  Endocrine: Negative for polydipsia and polyuria.  Genitourinary: Positive for menstrual problem. Negative for urgency, frequency, hematuria, flank pain, vaginal bleeding, vaginal discharge, difficulty urinating, vaginal pain and pelvic pain.  Musculoskeletal: Negative for arthralgias and myalgias.  Skin: Negative for rash.  Neurological: Negative for headaches.    Past Medical History  Diagnosis Date  . Hypertension    Poorly controlled. Has had HTN since age 34. Angioedema with ACEI.  24 Hr urine and renal arterial dopplers ordered . . . Never done  . Type II or unspecified type diabetes mellitus without mention of complication, uncontrolled DX: 2002  . NICM (nonischemic cardiomyopathy)     EF 45-50% in 8/12, cath 6/12 showed normal coronaries, EF 50-55% by LV gram  . Morbid obesity   . Allergic rhinitis   . HLD (hyperlipidemia)   . OSA on CPAP     sleep study in 8/12 showed moderate to severe OSA requiring CPAP  . Chronic lower back pain     secondary to DJD, obsetiy, hip problems. Followed by Dr. Ivory Broad (pain management)  . 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 Chronic diastolic heart failure      Primarily diastolic CHF: Likely due to uncontrolled HTN. Last echo (8/12) with EF 45-50%, mild to moderate LVH with some asymmetric septal hypertrophy, RV normal size and systolic function. EF 50-55% by LV-gram in 6/12.   . Degeneration of lumbar or lumbosacral intervertebral disc   . Polyneuropathy in diabetes(357.2)   . Thoracic or lumbosacral neuritis or radiculitis, unspecified   . Calcifying tendinitis of shoulder   . LBBB (left bundle branch block)   . Chronic combined systolic and diastolic CHF (congestive heart failure)     EF 40-45% by echo 12/06/2012  . Coronary artery disease     questionable. LHC 05/2011 showing normal coronaries // Followed at [facility], Dr. Shirlee Latch  . GERD (gastroesophageal reflux disease)    Social,  surgical, and family hx reviewed with patient.     Objective:   Physical Exam Filed Vitals:   12/19/13 0855  BP: 136/92  Pulse: 82  Temp: 97.3 F (36.3 C)   General: sitting in chair, NAD HEENT: PERRL, EOMI, no scleral icterus Cardiac: RRR, no rubs, murmurs or gallops Pulm: clear to auscultation bilaterally, moving normal volumes of air Abd: soft, nontender, nondistended, BS present Ext: warm and well perfused, no pedal edema Neuro:  alert and oriented X3, cranial nerves II-XII grossly intact     Assessment & Plan:  Please see problem oriented charting.   Pt discussed with Dr. Rogelia Boga.

## 2013-12-19 NOTE — Patient Instructions (Signed)
In terms of your diabetes:   -Increase your Lantus to 27 units at night -increase your novolog to 12-13 units with meals -come back in one month for re-evalution  For your itchiness -try the oral pills and finish them   For your periods we will let you see a Gynecologist at [facility] clinic  For congestion:  Can use nasal spray or Tussin DM  Hope you have a good new year!

## 2013-12-20 DIAGNOSIS — R8281 Pyuria: Secondary | ICD-10-CM | POA: Insufficient documentation

## 2013-12-20 DIAGNOSIS — N924 Excessive bleeding in the premenopausal period: Secondary | ICD-10-CM | POA: Insufficient documentation

## 2013-12-20 DIAGNOSIS — B3731 Acute candidiasis of vulva and vagina: Secondary | ICD-10-CM | POA: Insufficient documentation

## 2013-12-20 DIAGNOSIS — B373 Candidiasis of vulva and vagina: Secondary | ICD-10-CM | POA: Insufficient documentation

## 2013-12-20 NOTE — Assessment & Plan Note (Signed)
Pt last Hgb A1c 9.7 back in 11/14 when pt was not having any urinary symptoms. Today HgbA1c 10.7. Elevated CBGs doesn't appear to be 2/2 to infection and pt no ketones in urine to suggest HHS or DKA.  -DM foot exam performed -increase lantus to 27 U qhs -increase novolog to 12-13 U TID WC -pt denied flu shot and retinal exam  -recheck in 1 month for possible titration

## 2013-12-20 NOTE — Assessment & Plan Note (Signed)
Pt has had recurrent infections when blood sugars become out of control. She also attributes it to stress. The patient has also been having some perimenopausal symptoms which may be altering the pH of the vaginal environment causing predisposition to infection. -Fluconazole 150 mg q. 72 for 3 doses

## 2013-12-20 NOTE — Assessment & Plan Note (Signed)
Patient has had some pyuria but no dysuria or increased frequency. No other systemic signs or symptoms of ongoing infection. Urine dip showed only significant glucosuria but negative ketones and negative nitrites. -Urine culture -Followup when necessary if does not improve

## 2013-12-20 NOTE — Progress Notes (Signed)
Case discussed with Dr. Sadek soon after the resident saw the patient.  We reviewed the resident's history and exam and pertinent patient test results.  I agree with the assessment, diagnosis, and plan of care documented in the resident's note. 

## 2013-12-20 NOTE — Assessment & Plan Note (Signed)
Pt appears to have perimenopausal symptoms and not pregnant at this time. Pt not sexually active and no hx of abnormal paps.  -GYN referral

## 2013-12-21 ENCOUNTER — Telehealth: Payer: Self-pay | Admitting: Internal Medicine

## 2013-12-21 LAB — URINE CULTURE

## 2013-12-21 MED ORDER — SULFAMETHOXAZOLE-TRIMETHOPRIM 400-80 MG PO TABS: 1.0000 | ORAL_TABLET | Freq: Two times a day (BID) | ORAL | Status: AC

## 2013-12-21 MED ORDER — SULFAMETHOXAZOLE-TRIMETHOPRIM 400-80 MG PO TABS: 1.0000 | ORAL_TABLET | Freq: Two times a day (BID) | ORAL | Status: DC

## 2013-12-21 NOTE — Telephone Encounter (Signed)
Called to inform pt of E.coli UTI that was pansensitive. She was still having symptoms and was allergic to Clindamycin. Therefore Bactrim DS bid for 7 days was sent. All questions answered.

## 2013-12-24 ENCOUNTER — Telehealth: Payer: Self-pay

## 2013-12-24 DIAGNOSIS — M1712 Unilateral primary osteoarthritis, left knee: Secondary | ICD-10-CM

## 2013-12-24 DIAGNOSIS — M47816 Spondylosis without myelopathy or radiculopathy, lumbar region: Secondary | ICD-10-CM

## 2013-12-24 NOTE — Telephone Encounter (Signed)
Patient is requesting a refill on Oxycodone. She also has a question about Tegretol.

## 2013-12-25 MED ORDER — OXYCODONE-ACETAMINOPHEN 10-325 MG PO TABS: 1.0000 | ORAL_TABLET | Freq: Three times a day (TID) | ORAL | Status: DC | PRN

## 2013-12-25 NOTE — Telephone Encounter (Signed)
Percocet rx printed.  Left message informing pateint rx will be ready for pick up after 3 on 12/25/2012 and also that her tegretol has refills and she should contact her pharmacy.

## 2014-01-01 ENCOUNTER — Emergency Department (HOSPITAL_COMMUNITY): Payer: Medicaid Other

## 2014-01-01 ENCOUNTER — Encounter (HOSPITAL_COMMUNITY): Payer: Self-pay | Admitting: Emergency Medicine

## 2014-01-01 ENCOUNTER — Emergency Department (HOSPITAL_COMMUNITY)
Admission: EM | Admit: 2014-01-01 | Discharge: 2014-01-01 | Disposition: A | Discharge status: Home / Self Care | Payer: Medicaid Other | Attending: Emergency Medicine | Admitting: Emergency Medicine

## 2014-01-01 DIAGNOSIS — M169 Osteoarthritis of hip, unspecified: Secondary | ICD-10-CM | POA: Insufficient documentation

## 2014-01-01 DIAGNOSIS — R197 Diarrhea, unspecified: Secondary | ICD-10-CM | POA: Insufficient documentation

## 2014-01-01 DIAGNOSIS — I10 Essential (primary) hypertension: Secondary | ICD-10-CM | POA: Insufficient documentation

## 2014-01-01 DIAGNOSIS — Z9981 Dependence on supplemental oxygen: Secondary | ICD-10-CM | POA: Insufficient documentation

## 2014-01-01 DIAGNOSIS — R5383 Other fatigue: Secondary | ICD-10-CM

## 2014-01-01 DIAGNOSIS — Z794 Long term (current) use of insulin: Secondary | ICD-10-CM | POA: Insufficient documentation

## 2014-01-01 DIAGNOSIS — E1142 Type 2 diabetes mellitus with diabetic polyneuropathy: Secondary | ICD-10-CM | POA: Insufficient documentation

## 2014-01-01 DIAGNOSIS — G4733 Obstructive sleep apnea (adult) (pediatric): Secondary | ICD-10-CM | POA: Insufficient documentation

## 2014-01-01 DIAGNOSIS — Z7982 Long term (current) use of aspirin: Secondary | ICD-10-CM | POA: Insufficient documentation

## 2014-01-01 DIAGNOSIS — R739 Hyperglycemia, unspecified: Secondary | ICD-10-CM

## 2014-01-01 DIAGNOSIS — M545 Low back pain, unspecified: Secondary | ICD-10-CM | POA: Insufficient documentation

## 2014-01-01 DIAGNOSIS — K219 Gastro-esophageal reflux disease without esophagitis: Secondary | ICD-10-CM | POA: Insufficient documentation

## 2014-01-01 DIAGNOSIS — M5137 Other intervertebral disc degeneration, lumbosacral region: Secondary | ICD-10-CM | POA: Insufficient documentation

## 2014-01-01 DIAGNOSIS — Z79899 Other long term (current) drug therapy: Secondary | ICD-10-CM | POA: Insufficient documentation

## 2014-01-01 DIAGNOSIS — G8929 Other chronic pain: Secondary | ICD-10-CM | POA: Insufficient documentation

## 2014-01-01 DIAGNOSIS — Z95810 Presence of automatic (implantable) cardiac defibrillator: Secondary | ICD-10-CM | POA: Insufficient documentation

## 2014-01-01 DIAGNOSIS — Z9109 Other allergy status, other than to drugs and biological substances: Secondary | ICD-10-CM | POA: Insufficient documentation

## 2014-01-01 DIAGNOSIS — I5042 Chronic combined systolic (congestive) and diastolic (congestive) heart failure: Secondary | ICD-10-CM | POA: Insufficient documentation

## 2014-01-01 DIAGNOSIS — E1149 Type 2 diabetes mellitus with other diabetic neurological complication: Secondary | ICD-10-CM | POA: Insufficient documentation

## 2014-01-01 DIAGNOSIS — R609 Edema, unspecified: Secondary | ICD-10-CM | POA: Insufficient documentation

## 2014-01-01 DIAGNOSIS — R110 Nausea: Secondary | ICD-10-CM | POA: Insufficient documentation

## 2014-01-01 DIAGNOSIS — M51379 Other intervertebral disc degeneration, lumbosacral region without mention of lumbar back pain or lower extremity pain: Secondary | ICD-10-CM | POA: Insufficient documentation

## 2014-01-01 DIAGNOSIS — I2510 Atherosclerotic heart disease of native coronary artery without angina pectoris: Secondary | ICD-10-CM | POA: Insufficient documentation

## 2014-01-01 DIAGNOSIS — R5381 Other malaise: Secondary | ICD-10-CM | POA: Insufficient documentation

## 2014-01-01 DIAGNOSIS — E785 Hyperlipidemia, unspecified: Secondary | ICD-10-CM | POA: Insufficient documentation

## 2014-01-01 DIAGNOSIS — R630 Anorexia: Secondary | ICD-10-CM | POA: Insufficient documentation

## 2014-01-01 DIAGNOSIS — M1610 Unilateral primary osteoarthritis, unspecified hip: Secondary | ICD-10-CM | POA: Insufficient documentation

## 2014-01-01 LAB — CBC
HCT: 37.9 % (ref 36.0–46.0)
HEMOGLOBIN: 12.8 g/dL (ref 12.0–15.0)
MCH: 28.4 pg (ref 26.0–34.0)
MCHC: 33.8 g/dL (ref 30.0–36.0)
MCV: 84 fL (ref 78.0–100.0)
Platelets: 262 10*3/uL (ref 150–400)
RBC: 4.51 MIL/uL (ref 3.87–5.11)
RDW: 12.7 % (ref 11.5–15.5)
WBC: 10.5 10*3/uL (ref 4.0–10.5)

## 2014-01-01 LAB — GLUCOSE, CAPILLARY
GLUCOSE-CAPILLARY: 431 mg/dL — AB (ref 70–99)
GLUCOSE-CAPILLARY: 339 mg/dL — AB (ref 70–99)
GLUCOSE-CAPILLARY: 266 mg/dL — AB (ref 70–99)

## 2014-01-01 LAB — BASIC METABOLIC PANEL
BUN: 8 mg/dL (ref 6–23)
CHLORIDE: 95 meq/L — AB (ref 96–112)
CO2: 24 mEq/L (ref 19–32)
Calcium: 8.9 mg/dL (ref 8.4–10.5)
Creatinine, Ser: 0.74 mg/dL (ref 0.50–1.10)
Glucose, Bld: 369 mg/dL — ABNORMAL HIGH (ref 70–99)
POTASSIUM: 4.2 meq/L (ref 3.7–5.3)
SODIUM: 134 meq/L — AB (ref 137–147)

## 2014-01-01 LAB — POCT I-STAT TROPONIN I: Troponin i, poc: 0 ng/mL (ref 0.00–0.08)

## 2014-01-01 LAB — PRO B NATRIURETIC PEPTIDE: Pro B Natriuretic peptide (BNP): 191.3 pg/mL — ABNORMAL HIGH (ref 0–125)

## 2014-01-01 MED ORDER — SODIUM CHLORIDE 0.9 % IV BOLUS (SEPSIS)
1000.0000 mL | Freq: Once | INTRAVENOUS | Status: AC
  Administered 2014-01-01: 1000 mL via INTRAVENOUS
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
## 2014-01-01 NOTE — Discharge Instructions (Signed)
Recommend you follow up with your primary care provider as well as your cardiologist for further evaluation of your symptoms. Return to the emergency department if symptoms worsen.  Fatigue Fatigue is a feeling of tiredness, lack of energy, lack of motivation, or feeling tired all the time. Having enough rest, good nutrition, and reducing stress will normally reduce fatigue. Consult your caregiver if it persists. The nature of your fatigue will help your caregiver to find out its cause. The treatment is based on the cause.  CAUSES  There are many causes for fatigue. Most of the time, fatigue can be traced to one or more of your habits or routines. Most causes fit into one or more of three general areas. They are: Lifestyle problems  Sleep disturbances.  Overwork.  Physical exertion.  Unhealthy habits.  Poor eating habits or eating disorders.  Alcohol and/or drug use .  Lack of proper nutrition (malnutrition). Psychological problems  Stress and/or anxiety problems.  Depression.  Grief.  Boredom. Medical Problems or Conditions  Anemia.  Pregnancy.  Thyroid gland problems.  Recovery from major surgery.  Continuous pain.  Emphysema or asthma that is not well controlled  Allergic conditions.  Diabetes.  Infections (such as mononucleosis).  Obesity.  Sleep disorders, such as sleep apnea.  Heart failure or other heart-related problems.  Cancer.  Kidney disease.  Liver disease.  Effects of certain medicines such as antihistamines, cough and cold remedies, prescription pain medicines, heart and blood pressure medicines, drugs used for treatment of cancer, and some antidepressants. SYMPTOMS  The symptoms of fatigue include:   Lack of energy.  Lack of drive (motivation).  Drowsiness.  Feeling of indifference to the surroundings. DIAGNOSIS  The details of how you feel help guide your caregiver in finding out what is causing the fatigue. You will be asked  about your present and past health condition. It is important to review all medicines that you take, including prescription and non-prescription items. A thorough exam will be done. You will be questioned about your feelings, habits, and normal lifestyle. Your caregiver may suggest blood tests, urine tests, or other tests to look for common medical causes of fatigue.  TREATMENT  Fatigue is treated by correcting the underlying cause. For example, if you have continuous pain or depression, treating these causes will improve how you feel. Similarly, adjusting the dose of certain medicines will help in reducing fatigue.  HOME CARE INSTRUCTIONS   Try to get the required amount of good sleep every night.  Eat a healthy and nutritious diet, and drink enough water throughout the day.  Practice ways of relaxing (including yoga or meditation).  Exercise regularly.  Make plans to change situations that cause stress. Act on those plans so that stresses decrease over time. Keep your work and personal routine reasonable.  Avoid street drugs and minimize use of alcohol.  Start taking a daily multivitamin after consulting your caregiver. SEEK MEDICAL CARE IF:   You have persistent tiredness, which cannot be accounted for.  You have fever.  You have unintentional weight loss.  You have headaches.  You have disturbed sleep throughout the night.  You are feeling sad.  You have constipation.  You have dry skin.  You have gained weight.  You are taking any new or different medicines that you suspect are causing fatigue.  You are unable to sleep at night.  You develop any unusual swelling of your legs or other parts of your body. SEEK IMMEDIATE MEDICAL CARE IF:  You are feeling confused.  Your vision is blurred.  You feel faint or pass out.  You develop severe headache.  You develop severe abdominal, pelvic, or back pain.  You develop chest pain, shortness of breath, or an irregular  or fast heartbeat.  You are unable to pass a normal amount of urine.  You develop abnormal bleeding such as bleeding from the rectum or you vomit blood.  You have thoughts about harming yourself or committing suicide.  You are worried that you might harm someone else. MAKE SURE YOU:   Understand these instructions.  Will watch your condition.  Will get help right away if you are not doing well or get worse. Document Released: 09/26/2007 Document Revised: 02/21/2012 Document Reviewed: 09/26/2007 [facility] Patient Information 2014 [place], [place].

## 2014-01-01 NOTE — ED Notes (Signed)
cbg was 266.

## 2014-01-01 NOTE — ED Provider Notes (Signed)
CSN: [id number]     Arrival date & time 01/01/14  1642 History   First MD Initiated Contact with Patient 01/01/14 1853     Chief Complaint  Patient presents with  . Fatigue  . 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 Patient states that she has been sleeping approximately 20 hours per day. She states that this is unusual for her as she normally is up and about at different points during the day. Patient states that symptoms were preceded by a week of insomnia at which time her primary care provider prescribed her Valium to help her sleep. Patient states that she has not needed to take any of his Valium for her symptoms as her fatigue started shortly after. Patient endorses an associated 10 pound weight gain since symptom onset as well as nausea. She also states she had 1 episode of watery, nonbloody diarrhea during the week, but this resolved spontaneously. She denies associated fever, vision changes, LOC, chest pain, new or worsening SOB, vomiting, melena, hematochezia, numbness/tingling, and extremity weakness. Patient denies any new or recent medications and states she has been compliant with her diabetic medication. She states the only medication she has been noncompliant taking is her Hydralazine because she is supposed to take it TID and usually is sleeping during the day.  LV EF on 12/11/13 - 30%  PCP - Dr. Ronnald Ramp Cardiologist - Dr. Tempie Hoist Pain management - Dr. Tessa Lerner She has not seen any of her doctors for this complaint.   Patient is a 48 y.o. female presenting with weakness. The history is provided by the patient. No language interpreter was used.   Weakness Associated symptoms include fatigue, nausea and weakness. Pertinent negatives include no abdominal pain, chest pain, fever, numbness or vomiting.    Past Medical History  Diagnosis Date  . Hypertension     Poorly controlled. Has had HTN since age 24. Angioedema with ACEI.  24 Hr urine and renal arterial dopplers ordered . . . Never done  . Type II or unspecified type diabetes mellitus without mention of complication, uncontrolled DX: 2002  . NICM (nonischemic cardiomyopathy)     EF 45-50% in 8/12, cath 6/12 showed normal coronaries, EF 50-55% by LV gram  . Morbid obesity   . Allergic rhinitis   . HLD (hyperlipidemia)   . OSA on CPAP     sleep study in 8/12 showed moderate to severe OSA requiring CPAP  . Chronic lower back pain     secondary to DJD, obsetiy, hip problems. Followed by Dr. Oval Linsey (pain management)  . 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 Chronic diastolic heart failure      Primarily diastolic CHF: Likely due to uncontrolled HTN. Last echo (8/12) with EF 45-50%, mild to moderate LVH with some asymmetric septal hypertrophy, RV normal size and systolic function. EF 50-55% by LV-gram in 6/12.   . Degeneration of lumbar or lumbosacral intervertebral disc   . Polyneuropathy in diabetes(357.2)   . Thoracic or lumbosacral neuritis or radiculitis, unspecified   . Calcifying tendinitis of shoulder   . LBBB (left bundle branch block)   . Chronic combined systolic and diastolic CHF (congestive heart failure)     EF 40-45% by  echo 12/06/2012  . Coronary artery disease     questionable. LHC 05/2011 showing normal coronaries // Followed at [facility], Dr. Aundra Dubin  . GERD (gastroesophageal reflux disease)    Past Surgical History  Procedure Laterality Date  . Breast biopsies Bilateral 2011    patient reports benign results  . Tubal ligation  05/31/1985  . Hand surgery Left   . Shoulder surgery Right   . Dental surgery      tumors  removed   . Cardiac catheterization  05/2011  . Pacemaker/defibrillator placed  08/22/13 Left 08/22/13   Family History  Problem Relation Age of Onset  . Heart disease Mother 21    Died of MI at age 42 yo  . Heart disease Paternal Grandmother     requiring pacemaker.  [name] Heart disease Paternal Grandfather 61    Died of MI at possibly age 11-53yo  . Stroke Paternal Grandfather   . Heart disease Father 28    MI age 92yo requiring stenting  . Kidney disease Mother     requiring dialysis  . Congestive Heart Failure Mother   . Diabetes Father   . Diabetes Brother   . Heart disease Brother 51    MI at age 29 years old  . Breast cancer Paternal Aunt   . Breast cancer Maternal Grandmother    History  Substance Use Topics  . Smoking status: Never Smoker   . Smokeless tobacco: Never Used  . Alcohol Use: No   OB History   Grav Para Term Preterm Abortions TAB SAB Ect Mult Living                 Review of Systems  Constitutional: Positive for appetite change and fatigue. Negative for fever.  Eyes: Negative for visual disturbance.  Respiratory: Negative for shortness of breath.   Cardiovascular: Negative for chest pain.  Gastrointestinal: Positive for nausea and diarrhea. Negative for vomiting and abdominal pain.  Genitourinary: Negative for dysuria and hematuria.  Neurological: Positive for weakness. Negative for syncope and numbness.  Psychiatric/Behavioral: Positive for sleep disturbance.  All other systems reviewed and are negative.    Allergies  Clindamycin; Enalapril maleate; and Lisinopril  Home Medications   Current Outpatient Rx  Name  Route  Sig  Dispense  Refill  . albuterol (PROVENTIL HFA;VENTOLIN HFA) 108 (90 BASE) MCG/ACT inhaler   Inhalation   Inhale 2 puffs into the lungs every 6 (six) hours as needed for wheezing or shortness of breath.         [name] amLODipine (NORVASC) 5 MG tablet   Oral   Take 1 tablet (5 mg total) by mouth daily.   30 tablet   3   .  aspirin EC 81 MG tablet   Oral   Take 81 mg by mouth daily.          . carbamazepine (TEGRETOL) 200 MG tablet   Oral   Take 1 tablet (200 mg total) by mouth 2 (two) times daily.   60 tablet   5   . cyclobenzaprine (FLEXERIL) 10 MG tablet   Oral   Take 1 tablet (10 mg total) by mouth 3 (three) times daily as needed for muscle spasms.   30 tablet   2   . diclofenac sodium (VOLTAREN) 1 % GEL   Topical   Apply 2 g topically 4 (four) times daily as needed (for pain).   3 Tube   2   . digoxin (LANOXIN) 0.125 MG tablet  Oral   Take 1 tablet (0.125 mg total) by mouth daily.   30 tablet   11   . furosemide (LASIX) 40 MG tablet   Oral   Take 1 tablet (40 mg total) by mouth 2 (two) times daily.   60 tablet   5   . hydrALAZINE (APRESOLINE) 100 MG tablet   Oral   Take 1 tablet (100 mg total) by mouth 3 (three) times daily.   90 tablet   10     Please file dose change; cancel previous orders.   [name] ibuprofen (ADVIL,MOTRIN) 200 MG tablet   Oral   Take 400 mg by mouth 3 (three) times daily as needed (pain).          . insulin aspart (NOVOLOG FLEXPEN) 100 UNIT/ML injection   Subcutaneous   Inject 12-13 Units into the skin 3 (three) times daily before meals.   1 vial   11   . Insulin Glargine (LANTUS SOLOSTAR) 100 UNIT/ML Solostar Pen   Subcutaneous   Inject 27 Units into the skin at bedtime.   5 pen   11   . isosorbide mononitrate (IMDUR) 60 MG 24 hr tablet   Oral   Take 1 tablet (60 mg total) by mouth daily.   30 tablet   11   . metoprolol succinate (TOPROL-XL) 50 MG 24 hr tablet   Oral   Take 1 tablet (50 mg total) by mouth 2 (two) times daily.   60 tablet   3     Please file dose change and had to increase medica ...   . oxyCODONE-acetaminophen (PERCOCET) 10-325 MG per tablet   Oral   Take 1 tablet by mouth every 8 (eight) hours as needed for pain. Must last 30 days. For pain   70 tablet   0   . pantoprazole (PROTONIX) 40 MG tablet   Oral   Take 1  tablet (40 mg total) by mouth daily.   30 tablet   5   . simvastatin (ZOCOR) 40 MG tablet   Oral   Take 40 mg by mouth daily at 6 PM.         . spironolactone (ALDACTONE) 25 MG tablet   Oral   Take 1 tablet (25 mg total) by mouth daily.   90 tablet   3     Please file dose change; cancel all previous order ...   . ACCU-CHEK FASTCLIX LANCETS MISC   Does not apply   1 each by Does not apply route 3 (three) times daily. USE UP TO 3 TIMES DAILY TO CHECK BLOOD SUGAR. DIAG CODE 250.00. INSULIN DEPENDENT   102 each   6   . Blood Glucose Monitoring Suppl (ACCU-CHEK NANO SMARTVIEW) W/DEVICE KIT   Does not apply   1 each by Does not apply route 3 (three) times daily with meals.   1 kit   0   . fluconazole (DIFLUCAN) 150 MG tablet   Oral   Take 1 tablet (150 mg total) by mouth every 3 (three) days.   3 tablet   0   . glucose blood (ACCU-CHEK SMARTVIEW) test strip      Use to check blood sugar 3 times per day   100 each   12     Strips for Accu-Chek Nano Smartview Glucometer.  D ...   . Insulin Pen Needle 31G X 5 MM MISC      Use to inject insulin 4 times a day. Dx code: 87.62.Insulin  dependent.   120 each   11   . Misc. Devices (ROLLATOR) MISC      Use daily for ambulation   1 each   0    BP 131/84  Pulse 95  Temp(Src) 97.2 F (36.2 C) (Oral)  Resp 16  Ht $R'5\' 9"'jm$  (1.753 m)  Wt 289 lb (131.09 kg)  BMI 42.66 kg/m2  SpO2 97%  LMP 12/21/2013  Physical Exam  Nursing note and vitals reviewed. Constitutional: She is oriented to person, place, and time. She appears well-developed and well-nourished. No distress.  HENT:  Head: Normocephalic and atraumatic.  Mouth/Throat: Oropharynx is clear and moist. No oropharyngeal exudate.  Eyes: Conjunctivae and EOM are normal. Pupils are equal, round, and reactive to light. No scleral icterus.  Neck: Normal range of motion.  Cardiovascular: Normal rate, regular rhythm and normal heart sounds.   Pulmonary/Chest: Effort  normal and breath sounds normal. No respiratory distress. She has no wheezes. She has no rales.  Abdominal: Soft. There is no tenderness. There is no rebound and no guarding.  Soft morbidly obese abdomen. No peritoneal signs or guarding.  Musculoskeletal: Normal range of motion.  +1 pitting edema in b/l lower extremities.  Neurological: She is alert and oriented to person, place, and time. She has normal reflexes. No cranial nerve deficit. She exhibits normal muscle tone.  GCS 15. Patient speaks in full goal oriented sentences. No cranial nerve deficits appreciated; symmetric eyebrow raise, equal tongue protrusion, no facial drooping, and normal shoulder shrug. Patient moves extremities without ataxia. No gross sensory deficits appreciated.  Skin: Skin is warm and dry. No rash noted. She is not diaphoretic. No erythema. No pallor.  Psychiatric: She has a normal mood and affect. Her behavior is normal.    ED Course  Procedures (including critical care time)  Labs Review Labs Reviewed  PRO B NATRIURETIC PEPTIDE - Abnormal; Notable for the following:    Pro B Natriuretic peptide (BNP) 191.3 (*)    All other components within normal limits  BASIC METABOLIC PANEL - Abnormal; Notable for the following:    Sodium 134 (*)    Chloride 95 (*)    Glucose, Bld 369 (*)    All other components within normal limits  GLUCOSE, CAPILLARY - Abnormal; Notable for the following:    Glucose-Capillary 431 (*)    All other components within normal limits  GLUCOSE, CAPILLARY - Abnormal; Notable for the following:    Glucose-Capillary 339 (*)    All other components within normal limits  GLUCOSE, CAPILLARY - Abnormal; Notable for the following:    Glucose-Capillary 266 (*)    All other components within normal limits  CBC  URINALYSIS, ROUTINE W REFLEX MICROSCOPIC  POCT I-STAT TROPONIN I   Imaging Review Dg Chest 2 View  01/01/2014   CLINICAL DATA:  Shortness of breath, fatigue, hypertension  EXAM: CHEST   2 VIEW  COMPARISON:  CT from 09/24/2013  FINDINGS: Left-sided triple lead transvenous pacemaker/AICD is again noted. Cardiomegaly is stable.  The lungs are normally inflated. No airspace consolidation, pleural effusion, or pulmonary edema is identified. There is no pneumothorax.  No acute osseous abnormality identified.  IMPRESSION: Cardiomegaly, unchanged. No acute cardiopulmonary process identified.   Electronically Signed   By: Jeannine Boga M.D.   On: 01/01/2014 18:18    EKG Interpretation    Date/Time:  Tuesday January 01 2014 17:25:31 EST Ventricular Rate:  94 PR Interval:  142 QRS Duration: 128 QT Interval:  406 QTC Calculation: 507  R Axis:   -82 Text Interpretation:  Atrial-sensed ventricular-paced rhythm Abnormal ECG ED PHYSICIAN INTERPRETATION AVAILABLE IN [facility] Confirmed by TEST, RECORD ([id number]) on 01/03/2014 10:19:27 AM            MDM   1. Fatigue   2. Hyperglycemia    6085 - 48 year old female with numerous comorbidities presents for fatigue x1 week. Physical exam unremarkable. Labs significant for hyperglycemia of 431 on arrival. No leukocytosis or electrolyte imbalance; kidney function preserved. Will monitor blood sugar and reassess.  2220 - Patient up and ambulating to the bathroom without assistance and with normal gait. She is requesting graham crackers and Sprite. Patient's sugars down to 266. She does not have an anion gap; there is no evidence of metabolic acidosis or DKA. Believe patient's symptoms to be secondary to depression and insomnia prior to symptom onset. She states to me today that she has been going through a lot of turmoil in her life recently which, I believe, may be the cause of her increased sleepiness. I do not believe her weight gain to be associated to her congestive heart failure. Patient's chest x-ray today does show signs of acute CHF and BNP only 191, the lowest it has been over the course of a year. I also do not believe  patient symptoms or neurologic in nature. Her neurologic exam today is nonfocal. Doubt cardiac etiology of fatigue given work up today and patient without chest pain, worsening shortness of breath, or extremity numbness/weakness. Troponin negative and chest x-ray without signs of acute cardiopulmonary abnormalities. EKG stable with paced rhythm.  Patient stable for d/c with instruction to f/u with her PCP and cardiologist regarding her symptoms. Return precautions provided and patient agreeable to plan with no unaddressed concerns.   Filed Vitals:   01/01/14 1930 01/01/14 2015 01/01/14 2215 01/01/14 2225  BP: 131/84 146/82  140/78  Pulse: 95 93 91   Temp:      TempSrc:      Resp: $Remo'16 16 19   'igSZe$ Height:      Weight:      SpO2: 97% 97% 98%      Antonietta Breach, PA-C 01/04/14 [phone]

## 2014-01-01 NOTE — ED Notes (Addendum)
Pt reports generalized fatigue and weakness x 1 week. Having frequent urination, hx of diabetes. Reports feeling off balance but denies any sob or cp. cbg 431 at triage, ekg done, hx of chf.

## 2014-01-04 NOTE — ED Provider Notes (Signed)
Medical screening examination/treatment/procedure(s) were performed by non-physician practitioner and as supervising physician I was immediately available for consultation/collaboration.  EKG Interpretation    Date/Time:  Tuesday January 01 2014 17:25:31 EST Ventricular Rate:  94 PR Interval:  142 QRS Duration: 128 QT Interval:  406 QTC Calculation: 507 R Axis:   -82 Text Interpretation:  Atrial-sensed ventricular-paced rhythm Abnormal ECG ED PHYSICIAN INTERPRETATION AVAILABLE IN [facility] Confirmed by TEST, RECORD ([id number]) on 01/03/2014 10:19:27 AM              Loren Racer, MD 01/04/14 [phone]

## 2014-01-08 ENCOUNTER — Encounter: Payer: Self-pay | Admitting: Internal Medicine

## 2014-01-09 ENCOUNTER — Encounter (HOSPITAL_COMMUNITY): Payer: Self-pay | Admitting: Emergency Medicine

## 2014-01-09 ENCOUNTER — Emergency Department (HOSPITAL_COMMUNITY): Payer: Medicaid Other

## 2014-01-09 ENCOUNTER — Inpatient Hospital Stay (HOSPITAL_COMMUNITY)
Admission: EM | Admit: 2014-01-09 | Discharge: 2014-01-15 | DRG: 065 | Disposition: A | Discharge status: Rehabilitation Facility | Payer: Medicaid Other | Attending: Internal Medicine | Admitting: Internal Medicine

## 2014-01-09 ENCOUNTER — Other Ambulatory Visit: Payer: Self-pay | Admitting: *Deleted

## 2014-01-09 DIAGNOSIS — M545 Low back pain, unspecified: Secondary | ICD-10-CM | POA: Diagnosis present

## 2014-01-09 DIAGNOSIS — F329 Major depressive disorder, single episode, unspecified: Secondary | ICD-10-CM | POA: Diagnosis present

## 2014-01-09 DIAGNOSIS — R29810 Facial weakness: Secondary | ICD-10-CM | POA: Diagnosis present

## 2014-01-09 DIAGNOSIS — I428 Other cardiomyopathies: Secondary | ICD-10-CM | POA: Diagnosis present

## 2014-01-09 DIAGNOSIS — I10 Essential (primary) hypertension: Secondary | ICD-10-CM

## 2014-01-09 DIAGNOSIS — R4789 Other speech disturbances: Secondary | ICD-10-CM | POA: Diagnosis present

## 2014-01-09 DIAGNOSIS — IMO0001 Reserved for inherently not codable concepts without codable children: Secondary | ICD-10-CM | POA: Diagnosis present

## 2014-01-09 DIAGNOSIS — I6350 Cerebral infarction due to unspecified occlusion or stenosis of unspecified cerebral artery: Principal | ICD-10-CM | POA: Diagnosis present

## 2014-01-09 DIAGNOSIS — R471 Dysarthria and anarthria: Secondary | ICD-10-CM

## 2014-01-09 DIAGNOSIS — E669 Obesity, unspecified: Secondary | ICD-10-CM

## 2014-01-09 DIAGNOSIS — I509 Heart failure, unspecified: Secondary | ICD-10-CM | POA: Diagnosis present

## 2014-01-09 DIAGNOSIS — E785 Hyperlipidemia, unspecified: Secondary | ICD-10-CM

## 2014-01-09 DIAGNOSIS — Z4502 Encounter for adjustment and management of automatic implantable cardiac defibrillator: Secondary | ICD-10-CM

## 2014-01-09 DIAGNOSIS — R4701 Aphasia: Secondary | ICD-10-CM | POA: Diagnosis present

## 2014-01-09 DIAGNOSIS — E876 Hypokalemia: Secondary | ICD-10-CM

## 2014-01-09 DIAGNOSIS — R5383 Other fatigue: Secondary | ICD-10-CM

## 2014-01-09 DIAGNOSIS — Z7982 Long term (current) use of aspirin: Secondary | ICD-10-CM

## 2014-01-09 DIAGNOSIS — R29898 Other symptoms and signs involving the musculoskeletal system: Secondary | ICD-10-CM | POA: Diagnosis present

## 2014-01-09 DIAGNOSIS — E1165 Type 2 diabetes mellitus with hyperglycemia: Secondary | ICD-10-CM

## 2014-01-09 DIAGNOSIS — I447 Left bundle-branch block, unspecified: Secondary | ICD-10-CM

## 2014-01-09 DIAGNOSIS — E1140 Type 2 diabetes mellitus with diabetic neuropathy, unspecified: Secondary | ICD-10-CM

## 2014-01-09 DIAGNOSIS — Z95810 Presence of automatic (implantable) cardiac defibrillator: Secondary | ICD-10-CM

## 2014-01-09 DIAGNOSIS — I639 Cerebral infarction, unspecified: Secondary | ICD-10-CM

## 2014-01-09 DIAGNOSIS — R5381 Other malaise: Secondary | ICD-10-CM | POA: Diagnosis present

## 2014-01-09 DIAGNOSIS — B373 Candidiasis of vulva and vagina: Secondary | ICD-10-CM

## 2014-01-09 DIAGNOSIS — N390 Urinary tract infection, site not specified: Secondary | ICD-10-CM

## 2014-01-09 DIAGNOSIS — Z6841 Body Mass Index (BMI) 40.0 and over, adult: Secondary | ICD-10-CM

## 2014-01-09 DIAGNOSIS — E1142 Type 2 diabetes mellitus with diabetic polyneuropathy: Secondary | ICD-10-CM

## 2014-01-09 DIAGNOSIS — I2510 Atherosclerotic heart disease of native coronary artery without angina pectoris: Secondary | ICD-10-CM | POA: Diagnosis present

## 2014-01-09 DIAGNOSIS — IMO0002 Reserved for concepts with insufficient information to code with codable children: Secondary | ICD-10-CM

## 2014-01-09 DIAGNOSIS — G894 Chronic pain syndrome: Secondary | ICD-10-CM | POA: Diagnosis present

## 2014-01-09 DIAGNOSIS — R51 Headache: Secondary | ICD-10-CM

## 2014-01-09 DIAGNOSIS — F3289 Other specified depressive episodes: Secondary | ICD-10-CM | POA: Diagnosis present

## 2014-01-09 DIAGNOSIS — I5042 Chronic combined systolic (congestive) and diastolic (congestive) heart failure: Secondary | ICD-10-CM

## 2014-01-09 DIAGNOSIS — F411 Generalized anxiety disorder: Secondary | ICD-10-CM | POA: Diagnosis present

## 2014-01-09 DIAGNOSIS — K219 Gastro-esophageal reflux disease without esophagitis: Secondary | ICD-10-CM | POA: Diagnosis present

## 2014-01-09 DIAGNOSIS — F32A Depression, unspecified: Secondary | ICD-10-CM

## 2014-01-09 DIAGNOSIS — Z79899 Other long term (current) drug therapy: Secondary | ICD-10-CM

## 2014-01-09 DIAGNOSIS — B3731 Acute candidiasis of vulva and vagina: Secondary | ICD-10-CM

## 2014-01-09 DIAGNOSIS — G4733 Obstructive sleep apnea (adult) (pediatric): Secondary | ICD-10-CM

## 2014-01-09 DIAGNOSIS — G8190 Hemiplegia, unspecified affecting unspecified side: Secondary | ICD-10-CM | POA: Diagnosis present

## 2014-01-09 DIAGNOSIS — E1149 Type 2 diabetes mellitus with other diabetic neurological complication: Secondary | ICD-10-CM | POA: Diagnosis present

## 2014-01-09 DIAGNOSIS — R209 Unspecified disturbances of skin sensation: Secondary | ICD-10-CM

## 2014-01-09 DIAGNOSIS — R9431 Abnormal electrocardiogram [ECG] [EKG]: Secondary | ICD-10-CM

## 2014-01-09 LAB — GLUCOSE, CAPILLARY
GLUCOSE-CAPILLARY: 279 mg/dL — AB (ref 70–99)
Glucose-Capillary: 303 mg/dL — ABNORMAL HIGH (ref 70–99)
Glucose-Capillary: 306 mg/dL — ABNORMAL HIGH (ref 70–99)
Glucose-Capillary: 303 mg/dL — ABNORMAL HIGH (ref 70–99)

## 2014-01-09 LAB — COMPREHENSIVE METABOLIC PANEL
ALBUMIN: 3.6 g/dL (ref 3.5–5.2)
ALT: 15 U/L (ref 0–35)
AST: 15 U/L (ref 0–37)
Alkaline Phosphatase: 80 U/L (ref 39–117)
BUN: 7 mg/dL (ref 6–23)
CO2: 21 mEq/L (ref 19–32)
CREATININE: 0.62 mg/dL (ref 0.50–1.10)
Calcium: 8.5 mg/dL (ref 8.4–10.5)
Chloride: 98 mEq/L (ref 96–112)
GFR calc Af Amer: >90 mL/min (ref >90)
GFR calc non Af Amer: >90 mL/min (ref >90)
Glucose, Bld: 311 mg/dL — ABNORMAL HIGH (ref 70–99)
Potassium: 3.6 mEq/L — ABNORMAL LOW (ref 3.7–5.3)
Sodium: 134 mEq/L — ABNORMAL LOW (ref 137–147)
TOTAL PROTEIN: 7.2 g/dL (ref 6.0–8.3)
Total Bilirubin: 0.6 mg/dL (ref 0.3–1.2)

## 2014-01-09 LAB — URINALYSIS, ROUTINE W REFLEX MICROSCOPIC
BILIRUBIN URINE: NEGATIVE
Glucose, UA: >1000 mg/dL — AB
Hgb urine dipstick: NEGATIVE
KETONES UR: NEGATIVE mg/dL
NITRITE: POSITIVE — AB
Protein, ur: NEGATIVE mg/dL
Specific Gravity, Urine: 1.023 (ref 1.005–1.030)
UROBILINOGEN UA: 0.2 mg/dL (ref 0.0–1.0)
pH: 6 (ref 5.0–8.0)

## 2014-01-09 LAB — POCT I-STAT, CHEM 8
BUN: 5 mg/dL — ABNORMAL LOW (ref 6–23)
CALCIUM ION: 1.15 mmol/L (ref 1.12–1.23)
CREATININE: 0.6 mg/dL (ref 0.50–1.10)
Chloride: 100 mEq/L (ref 96–112)
Glucose, Bld: 319 mg/dL — ABNORMAL HIGH (ref 70–99)
HCT: 41 % (ref 36.0–46.0)
Hemoglobin: 13.9 g/dL (ref 12.0–15.0)
Potassium: 3.5 mEq/L — ABNORMAL LOW (ref 3.7–5.3)
Sodium: 136 mEq/L — ABNORMAL LOW (ref 137–147)
TCO2: 23 mmol/L (ref 0–100)

## 2014-01-09 LAB — RAPID URINE DRUG SCREEN, HOSP PERFORMED
AMPHETAMINES: NOT DETECTED
Amphetamines: NOT DETECTED
BENZODIAZEPINES: NOT DETECTED
Barbiturates: NOT DETECTED
Barbiturates: NOT DETECTED
Benzodiazepines: NOT DETECTED
Cocaine: NOT DETECTED
Cocaine: NOT DETECTED
OPIATES: NOT DETECTED
OPIATES: NOT DETECTED
TETRAHYDROCANNABINOL: NOT DETECTED
Tetrahydrocannabinol: NOT DETECTED

## 2014-01-09 LAB — PROTIME-INR
INR: 1.05 (ref 0.00–1.49)
PROTHROMBIN TIME: 13.5 s (ref 11.6–15.2)

## 2014-01-09 LAB — CBC
HEMATOCRIT: 37.7 % (ref 36.0–46.0)
Hemoglobin: 12.8 g/dL (ref 12.0–15.0)
MCH: 28.1 pg (ref 26.0–34.0)
MCHC: 34 g/dL (ref 30.0–36.0)
MCV: 82.7 fL (ref 78.0–100.0)
PLATELETS: 213 10*3/uL (ref 150–400)
RBC: 4.56 MIL/uL (ref 3.87–5.11)
RDW: 12.9 % (ref 11.5–15.5)
WBC: 10.4 10*3/uL (ref 4.0–10.5)

## 2014-01-09 LAB — URINE MICROSCOPIC-ADD ON

## 2014-01-09 LAB — POCT I-STAT TROPONIN I: Troponin i, poc: 0 ng/mL (ref 0.00–0.08)

## 2014-01-09 LAB — TROPONIN I: Troponin I: <0.3 ng/mL (ref <0.30)

## 2014-01-09 MED ORDER — INSULIN ASPART 100 UNIT/ML ~~LOC~~ SOLN
0.0000 [IU] | Freq: Three times a day (TID) | SUBCUTANEOUS | Status: DC
  Administered 2014-01-10: 3 [IU] via SUBCUTANEOUS
  Administered 2014-01-10: 5 [IU] via SUBCUTANEOUS
  Administered 2014-01-10 – 2014-01-11 (×3): 3 [IU] via SUBCUTANEOUS
  Administered 2014-01-11 – 2014-01-13 (×6): 2 [IU] via SUBCUTANEOUS
  Administered 2014-01-13: 5 [IU] via SUBCUTANEOUS
  Administered 2014-01-14 (×2): 2 [IU] via SUBCUTANEOUS
  Administered 2014-01-14 – 2014-01-15 (×3): 3 [IU] via SUBCUTANEOUS
  Administered 2014-01-15: 2 [IU] via SUBCUTANEOUS

## 2014-01-09 MED ORDER — SIMVASTATIN 40 MG PO TABS
40.0000 mg | ORAL_TABLET | Freq: Every day | ORAL | Status: DC
  Administered 2014-01-10 – 2014-01-15 (×6): 40 mg via ORAL
  Filled 2014-01-09 (×6): qty 1

## 2014-01-09 MED ORDER — PANTOPRAZOLE SODIUM 40 MG PO TBEC
40.0000 mg | DELAYED_RELEASE_TABLET | Freq: Every day | ORAL | Status: DC
  Administered 2014-01-10 – 2014-01-15 (×6): 40 mg via ORAL
  Filled 2014-01-09 (×6): qty 1

## 2014-01-09 MED ORDER — DEXTROSE 5 % IV SOLN
1.0000 g | Freq: Once | INTRAVENOUS | Status: AC
  Administered 2014-01-09: 1 g via INTRAVENOUS
  Filled 2014-01-09: qty 10

## 2014-01-09 MED ORDER — ACCU-CHEK NANO SMARTVIEW W/DEVICE KIT: 1.0000 | PACK | Freq: Three times a day (TID) | Status: DC

## 2014-01-09 MED ORDER — SODIUM CHLORIDE 0.9 % IV BOLUS (SEPSIS)
1000.0000 mL | Freq: Once | INTRAVENOUS | Status: AC
  Administered 2014-01-09: 1000 mL via INTRAVENOUS

## 2014-01-09 MED ORDER — DIGOXIN 125 MCG PO TABS
0.1250 mg | ORAL_TABLET | Freq: Every day | ORAL | Status: DC
  Administered 2014-01-11 – 2014-01-15 (×5): 0.125 mg via ORAL
  Filled 2014-01-09 (×6): qty 1

## 2014-01-09 MED ORDER — ASPIRIN 300 MG RE SUPP
150.0000 mg | Freq: Once | RECTAL | Status: AC
  Administered 2014-01-09: 150 mg via RECTAL
  Filled 2014-01-09: qty 1

## 2014-01-09 MED ORDER — SPIRONOLACTONE 25 MG PO TABS
25.0000 mg | ORAL_TABLET | Freq: Every day | ORAL | Status: DC
  Filled 2014-01-09: qty 1

## 2014-01-09 MED ORDER — DEXTROSE 5 % IV SOLN
1.0000 g | INTRAVENOUS | Status: DC
  Administered 2014-01-10 – 2014-01-11 (×2): 1 g via INTRAVENOUS
  Filled 2014-01-09 (×3): qty 10

## 2014-01-09 MED ORDER — POTASSIUM CHLORIDE 10 MEQ/100ML IV SOLN
10.0000 meq | INTRAVENOUS | Status: AC
  Administered 2014-01-09 (×3): 10 meq via INTRAVENOUS
  Filled 2014-01-09 (×3): qty 100

## 2014-01-09 MED ORDER — ISOSORBIDE MONONITRATE ER 60 MG PO TB24
60.0000 mg | ORAL_TABLET | Freq: Every day | ORAL | Status: DC
  Filled 2014-01-09: qty 1

## 2014-01-09 MED ORDER — HYDRALAZINE HCL 50 MG PO TABS
100.0000 mg | ORAL_TABLET | Freq: Three times a day (TID) | ORAL | Status: DC
  Filled 2014-01-09 (×3): qty 2

## 2014-01-09 MED ORDER — FUROSEMIDE 40 MG PO TABS
40.0000 mg | ORAL_TABLET | Freq: Two times a day (BID) | ORAL | Status: DC
  Filled 2014-01-09 (×2): qty 1

## 2014-01-09 MED ORDER — ALBUTEROL SULFATE HFA 108 (90 BASE) MCG/ACT IN AERS: 2.0000 | INHALATION_SPRAY | Freq: Four times a day (QID) | RESPIRATORY_TRACT | Status: DC | PRN

## 2014-01-09 MED ORDER — ALBUTEROL SULFATE (2.5 MG/3ML) 0.083% IN NEBU: 2.5000 mg | INHALATION_SOLUTION | Freq: Four times a day (QID) | RESPIRATORY_TRACT | Status: DC | PRN

## 2014-01-09 MED ORDER — INSULIN GLARGINE 100 UNIT/ML ~~LOC~~ SOLN
12.0000 [IU] | Freq: Every day | SUBCUTANEOUS | Status: DC
  Administered 2014-01-09 – 2014-01-13 (×5): 12 [IU] via SUBCUTANEOUS
  Filled 2014-01-09 (×7): qty 0.12

## 2014-01-09 MED ORDER — METOPROLOL SUCCINATE ER 50 MG PO TB24
50.0000 mg | ORAL_TABLET | Freq: Two times a day (BID) | ORAL | Status: DC
  Filled 2014-01-09 (×2): qty 1

## 2014-01-09 MED ORDER — CARBAMAZEPINE 200 MG PO TABS
200.0000 mg | ORAL_TABLET | Freq: Two times a day (BID) | ORAL | Status: DC
  Administered 2014-01-10 – 2014-01-15 (×11): 200 mg via ORAL
  Filled 2014-01-09 (×12): qty 1

## 2014-01-09 MED ORDER — AMLODIPINE BESYLATE 5 MG PO TABS
5.0000 mg | ORAL_TABLET | Freq: Every day | ORAL | Status: DC
  Filled 2014-01-09: qty 1

## 2014-01-09 MED ORDER — LORAZEPAM 2 MG/ML IJ SOLN
1.0000 mg | Freq: Once | INTRAMUSCULAR | Status: AC
  Administered 2014-01-09: 1 mg via INTRAVENOUS
  Filled 2014-01-09: qty 1

## 2014-01-09 MED ORDER — HEPARIN SODIUM (PORCINE) 5000 UNIT/ML IJ SOLN
5000.0000 [IU] | Freq: Three times a day (TID) | INTRAMUSCULAR | Status: DC
  Administered 2014-01-09 – 2014-01-15 (×18): 5000 [IU] via SUBCUTANEOUS
  Filled 2014-01-09 (×20): qty 1

## 2014-01-09 MED ORDER — LORAZEPAM 2 MG/ML IJ SOLN: 1.0000 mg | Freq: Once | INTRAMUSCULAR | Status: DC

## 2014-01-09 MED ORDER — ASPIRIN EC 81 MG PO TBEC
81.0000 mg | DELAYED_RELEASE_TABLET | Freq: Every day | ORAL | Status: DC
  Administered 2014-01-10: 81 mg via ORAL
  Filled 2014-01-09 (×2): qty 1

## 2014-01-09 NOTE — Progress Notes (Signed)
Was asked by IMTS to re-evaluate patients swallow. Patient still says she is unable to cough or clear throat "she doesn't know why." Informed her that if she is unable to cough we need to get her evaluated by SLP. She agrees, have kept patient NPO.

## 2014-01-09 NOTE — ED Notes (Signed)
Pt returned from radiology. sts she would like the ativan now.

## 2014-01-09 NOTE — Consult Note (Signed)
NEURO HOSPITALIST CONSULT NOTE    Reason for Consult:left face-arm weakness, dysarthria.  HPI:                                                                                                                                          [name] is an 48 y.o. female, right handed, with a past medical history significant for HTN, DM, hyperlipidemia, morbid obesity, nonischemic cardiomyopathy, chronic diastolic heart failure, CAD, s/p pacemaker placement, OSA on CPAP, DJD, GERD, brought to [facility] ED for further evaluation of the above stated symptoms. She stated that around 10 pm last night her speech became slurred and subsequently the left face was droopier than the right face. Then, she started noticing weakness and numbness of the left arm. Previous admission to [facility] with language impairment but unremarkable neurological work up. Denies HA, vertigo, double vision, difficulty swallowing, confusion, or visual disturbances. CT brain today revealed no acute abnormality. Takes aspirin 81 mg daily.   Past Medical History  Diagnosis Date  . Hypertension     Poorly controlled. Has had HTN since age 47. Angioedema with ACEI.  24 Hr urine and renal arterial dopplers ordered . . . Never done  . Type II or unspecified type diabetes mellitus without mention of complication, uncontrolled DX: 2002  . NICM (nonischemic cardiomyopathy)     EF 45-50% in 8/12, cath 6/12 showed normal coronaries, EF 50-55% by LV gram  . Morbid obesity   . Allergic rhinitis   . HLD (hyperlipidemia)   . OSA on CPAP     sleep study in 8/12 showed moderate to severe OSA requiring CPAP  . Chronic lower back pain     secondary to DJD, obsetiy, hip problems. Followed by Dr. Oval Linsey (pain management)  . 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 Chronic diastolic heart failure      Primarily diastolic CHF: Likely due to uncontrolled HTN. Last echo (8/12) with EF 45-50%, mild to moderate LVH  with some asymmetric septal hypertrophy, RV normal size and systolic function. EF 50-55% by LV-gram in 6/12.   . Degeneration of lumbar or lumbosacral intervertebral disc   . Polyneuropathy in diabetes(357.2)   . Thoracic or lumbosacral neuritis or radiculitis, unspecified   . Calcifying tendinitis of shoulder   . LBBB (left bundle branch block)   . Chronic combined systolic and diastolic CHF (congestive heart failure)     EF 40-45% by echo 12/06/2012  . Coronary artery disease     questionable. LHC 05/2011 showing normal coronaries // Followed at [facility], Dr. Aundra Dubin  . GERD (gastroesophageal reflux disease)     Past Surgical History  Procedure Laterality Date  . Breast biopsies Bilateral 2011    patient reports  benign results  . Tubal ligation  05/31/1985  . Hand surgery Left   . Shoulder surgery Right   . Dental surgery      tumors removed   . Cardiac catheterization  05/2011  . Pacemaker/defibrillator placed  08/22/13 Left 08/22/13    Family History  Problem Relation Age of Onset  . Heart disease Mother 94    Died of MI at age 26 yo  . Heart disease Paternal Grandmother     requiring pacemaker.  [name] Heart disease Paternal Grandfather 91    Died of MI at possibly age 48-53yo  . Stroke Paternal Grandfather   . Heart disease Father 60    MI age 29yo requiring stenting  . Kidney disease Mother     requiring dialysis  . Congestive Heart Failure Mother   . Diabetes Father   . Diabetes Brother   . Heart disease Brother 41    MI at age 83 years old  . Breast cancer Paternal Aunt   . Breast cancer Maternal Grandmother      Social History:  reports that she has never smoked. She has never used smokeless tobacco. She reports that she does not drink alcohol or use illicit drugs.  Allergies  Allergen Reactions  . Clindamycin Anaphylaxis and Swelling  . Enalapril Maleate Swelling  . Lisinopril Swelling    MEDICATIONS:                                                                                                                      I have reviewed the patient's current medications.   ROS:                                                                                                                                       History obtained from the patient and family.  General ROS: negative for - chills, fatigue, fever, night sweats, or weight loss Psychological ROS: negative for - behavioral disorder, hallucinations, memory difficulties, or suicidal ideation Ophthalmic ROS: negative for - blurry vision, double vision, eye pain or loss of vision ENT ROS: negative for - epistaxis, nasal discharge, oral lesions, sore throat, tinnitus or vertigo Allergy and Immunology ROS: negative for - hives or itchy/watery eyes Hematological and Lymphatic ROS: negative for - bleeding problems, bruising or swollen lymph nodes Endocrine ROS: negative for - galactorrhea, hair pattern changes, polydipsia/polyuria or temperature intolerance Respiratory ROS: negative  for - cough, hemoptysis, shortness of breath or wheezing Cardiovascular ROS: negative for - chest pain, dyspnea on exertion, edema or irregular heartbeat Gastrointestinal ROS: negative for - abdominal pain, diarrhea, hematemesis, nausea/vomiting or stool incontinence Genito-Urinary ROS: negative for - dysuria, hematuria, incontinence or urinary frequency/urgency Musculoskeletal ROS: negative for - joint swelling or muscular weakness Neurological ROS: as noted in HPI Dermatological ROS: negative for rash and skin lesion changes  Physical exam: pleasant female in no apparent distress. Blood pressure 123/78, pulse 94, temperature 98.5 F (36.9 C), temperature source Oral, resp. rate 16, height $RemoveBe'5\' 9"'rrsqqLUqO$  (1.753 m), weight 130.636 kg (288 lb), last menstrual period 12/21/2013, SpO2 97.00%. Head: normocephalic. Neck: supple, no bruits, no JVD. Cardiac: no murmurs. Lungs: clear. Abdomen: soft, no tender, no  mass. Extremities: no edema.  Neurologic Examination:                                                                                                      Mental Status: Alert, oriented, thought content appropriate. Dysarthria that virtually resolves when she gets distracted..  Able to follow 3 step commands without difficulty. Cranial Nerves: II: Discs flat bilaterally; Visual fields grossly normal, pupils equal, round, reactive to light and accommodation III,IV, VI: ptosis not present, extra-ocular motions intact bilaterally V,VII: smile asymmetric with left face weakness, facial light touch sensation normal bilaterally VIII: hearing normal bilaterally IX,X: gag reflex present XI: bilateral shoulder shrug XII: midline tongue extension without atrophy or fasciculations  Motor: Significant for left arm weakness, but able to use the left arm when going to the bathroom. Tone and bulk:normal tone throughout; no atrophy noted Sensory: Pinprick and light touch intact throughout, bilaterally Deep Tendon Reflexes:  1 all over Cerebellar: normal finger-to-nose in the right side. Can not perform in the left?. Gait: No ataxia. CV: pulses palpable throughout    Lab Results  Component Value Date/Time   CHOL 183 09/25/2013  4:30 AM    Results for orders placed during the hospital encounter of 01/09/14 (from the past 48 hour(s))  COMPREHENSIVE METABOLIC PANEL     Status: Abnormal   Collection Time    01/09/14 11:09 AM      Result Value Range   Sodium 134 (*) 137 - 147 mEq/L   Potassium 3.6 (*) 3.7 - 5.3 mEq/L   Chloride 98  96 - 112 mEq/L   CO2 21  19 - 32 mEq/L   Glucose, Bld 311 (*) 70 - 99 mg/dL   BUN 7  6 - 23 mg/dL   Creatinine, Ser 0.62  0.50 - 1.10 mg/dL   Calcium 8.5  8.4 - 10.5 mg/dL   Total Protein 7.2  6.0 - 8.3 g/dL   Albumin 3.6  3.5 - 5.2 g/dL   AST 15  0 - 37 U/L   ALT 15  0 - 35 U/L   Alkaline Phosphatase 80  39 - 117 U/L   Total Bilirubin 0.6  0.3 - 1.2 mg/dL    GFR calc non Af Amer >90  >90 mL/min   GFR calc Af Amer >90  >  90 mL/min   Comment: (NOTE)     The eGFR has been calculated using the CKD EPI equation.     This calculation has not been validated in all clinical situations.     eGFR's persistently <90 mL/min signify possible Chronic Kidney     Disease.  CBC     Status: None   Collection Time    01/09/14 11:09 AM      Result Value Range   WBC 10.4  4.0 - 10.5 K/uL   RBC 4.56  3.87 - 5.11 MIL/uL   Hemoglobin 12.8  12.0 - 15.0 g/dL   HCT 37.7  36.0 - 46.0 %   MCV 82.7  78.0 - 100.0 fL   MCH 28.1  26.0 - 34.0 pg   MCHC 34.0  30.0 - 36.0 g/dL   RDW 12.9  11.5 - 15.5 %   Platelets 213  150 - 400 K/uL  URINALYSIS, ROUTINE W REFLEX MICROSCOPIC     Status: Abnormal   Collection Time    01/09/14 11:26 AM      Result Value Range   Color, Urine YELLOW  YELLOW   APPearance CLOUDY (*) CLEAR   Specific Gravity, Urine 1.023  1.005 - 1.030   pH 6.0  5.0 - 8.0   Glucose, UA >1000 (*) NEGATIVE mg/dL   Hgb urine dipstick NEGATIVE  NEGATIVE   Bilirubin Urine NEGATIVE  NEGATIVE   Ketones, ur NEGATIVE  NEGATIVE mg/dL   Protein, ur NEGATIVE  NEGATIVE mg/dL   Urobilinogen, UA 0.2  0.0 - 1.0 mg/dL   Nitrite POSITIVE (*) NEGATIVE   Leukocytes, UA MODERATE (*) NEGATIVE  URINE RAPID DRUG SCREEN (HOSP PERFORMED)     Status: None   Collection Time    01/09/14 11:26 AM      Result Value Range   Opiates NONE DETECTED  NONE DETECTED   Cocaine NONE DETECTED  NONE DETECTED   Benzodiazepines NONE DETECTED  NONE DETECTED   Amphetamines NONE DETECTED  NONE DETECTED   Tetrahydrocannabinol NONE DETECTED  NONE DETECTED   Barbiturates NONE DETECTED  NONE DETECTED   Comment:            DRUG SCREEN FOR MEDICAL PURPOSES     ONLY.  IF CONFIRMATION IS NEEDED     FOR ANY PURPOSE, NOTIFY LAB     WITHIN 5 DAYS.                LOWEST DETECTABLE LIMITS     FOR URINE DRUG SCREEN     Drug Class       Cutoff (ng/mL)     Amphetamine      1000     Barbiturate      200      Benzodiazepine   299     Tricyclics       242     Opiates          300     Cocaine          300     THC              50  URINE MICROSCOPIC-ADD ON     Status: Abnormal   Collection Time    01/09/14 11:26 AM      Result Value Range   Squamous Epithelial / LPF FEW (*) RARE   WBC, UA TOO NUMEROUS TO COUNT  <3 WBC/hpf   RBC / HPF 0-2  <3 RBC/hpf   Bacteria, UA MANY (*)  RARE  GLUCOSE, CAPILLARY     Status: Abnormal   Collection Time    01/09/14 11:32 AM      Result Value Range   Glucose-Capillary 303 (*) 70 - 99 mg/dL  POCT I-STAT TROPONIN I     Status: None   Collection Time    01/09/14 11:33 AM      Result Value Range   Troponin i, poc 0.00  0.00 - 0.08 ng/mL   Comment 3            Comment: Due to the release kinetics of cTnI,     a negative result within the first hours     of the onset of symptoms does not rule out     myocardial infarction with certainty.     If myocardial infarction is still suspected,     repeat the test at appropriate intervals.  POCT I-STAT, CHEM 8     Status: Abnormal   Collection Time    01/09/14 11:35 AM      Result Value Range   Sodium 136 (*) 137 - 147 mEq/L   Potassium 3.5 (*) 3.7 - 5.3 mEq/L   Chloride 100  96 - 112 mEq/L   BUN 5 (*) 6 - 23 mg/dL   Creatinine, Ser 0.60  0.50 - 1.10 mg/dL   Glucose, Bld 319 (*) 70 - 99 mg/dL   Calcium, Ion 1.15  1.12 - 1.23 mmol/L   TCO2 23  0 - 100 mmol/L   Hemoglobin 13.9  12.0 - 15.0 g/dL   HCT 41.0  36.0 - 46.0 %  GLUCOSE, CAPILLARY     Status: Abnormal   Collection Time    01/09/14 12:55 PM      Result Value Range   Glucose-Capillary 279 (*) 70 - 99 mg/dL    Ct Head Wo Contrast  01/09/2014   CLINICAL DATA:  Feeling tired. Slurred speech. Left sided weakness. CVA.  EXAM: CT HEAD WITHOUT CONTRAST  TECHNIQUE: Contiguous axial images were obtained from the base of the skull through the vertex without intravenous contrast.  COMPARISON:  CTA head 10/06/2013.  FINDINGS: Asymmetric right-sided white  matter hypoattenuation is similar to the prior studies. No acute cortical infarct, hemorrhage, or mass lesion is present. The ventricles are proportionate to the degree of atrophy. No significant extra-axial fluid collection is present.  The paranasal sinuses are clear. Mucosal thickening is present along the inferior aspect of the right frontal sinus, similar to the prior exam. The paranasal sinuses and mastoid air cells are otherwise clear.  IMPRESSION: 1. Asymmetric right-sided white matter hypoattenuation is similar to the prior exam. 2. No acute intracranial abnormality. 3. Minimal sinus disease.   Electronically Signed   By: Lawrence Santiago M.D.   On: 01/09/2014 13:17   Assessment/Plan: 48 y/o with new onset dysarthria, left arm and left face weakness. Some inconsistencies on neuro-exam but she has multiple risk factors for stroke and thus can not exclude a right brain infarct.  CT brain negative for acute abnormality. Unfortunately, she can not have MRI brain due to pacemaker and thus will suggest admission to the hospital, getting a follow up CT brain in am, and pursuing further stroke work up in case a new stroke is demonstrated on CT.[name] Continue aspirin 81 mg daily. Will follow up.  Dorian Pod, MD 01/09/2014, 2:32 PM [facility].

## 2014-01-09 NOTE — Progress Notes (Signed)
ANTIBIOTIC CONSULT NOTE - INITIAL  Pharmacy Consult for Rocephin Indication: UTI  Allergies  Allergen Reactions  . Clindamycin Anaphylaxis and Swelling  . Enalapril Maleate Swelling  . Lisinopril Swelling    Patient Measurements: Height: 5\' 9"  (175.3 cm) Weight: 288 lb (130.636 kg) IBW/kg (Calculated) : 66.2  Vital Signs: Temp: 97.8 F (36.6 C) (01/28 1701) Temp src: Oral (01/28 1701) BP: 147/86 mmHg (01/28 1701) Pulse Rate: 85 (01/28 1701) Intake/Output from previous day:   Intake/Output from this shift:    Labs:  Recent Labs  01/09/14 1109 01/09/14 1135  WBC 10.4  --   HGB 12.8 13.9  PLT 213  --   CREATININE 0.62 0.60   Estimated Creatinine Clearance: 126.3 ml/min (by C-G formula based on Cr of 0.6). No results found for this basename: VANCOTROUGH, 01/11/14, VANCORANDOM, GENTTROUGH, GENTPEAK, GENTRANDOM, TOBRATROUGH, TOBRAPEAK, TOBRARND, AMIKACINPEAK, AMIKACINTROU, AMIKACIN,  in the last 72 hours   Microbiology: Recent Results (from the past 720 hour(s))  URINE CULTURE     Status: None   Collection Time    12/19/13 10:00 AM      Result Value Range Status   Culture ESCHERICHIA COLI   Final   Colony Count >=100,000 COLONIES/ML   Final   Organism ID, Bacteria ESCHERICHIA COLI   Final    Medical History: Past Medical History  Diagnosis Date  . Hypertension     Poorly controlled. Has had HTN since age 50. Angioedema with ACEI.  24 Hr urine and renal arterial dopplers ordered . . . Never done  . Type II or unspecified type diabetes mellitus without mention of complication, uncontrolled DX: 2002  . NICM (nonischemic cardiomyopathy)     EF 45-50% in 8/12, cath 6/12 showed normal coronaries, EF 50-55% by LV gram  . Morbid obesity   . Allergic rhinitis   . HLD (hyperlipidemia)   . OSA on CPAP     sleep study in 8/12 showed moderate to severe OSA requiring CPAP  . Chronic lower back pain     secondary to DJD, obsetiy, hip problems. Followed by Dr. 10/12 (pain management)  . 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 Chronic diastolic heart failure      Primarily diastolic CHF: Likely due to uncontrolled HTN. Last echo (8/12) with EF 45-50%, mild to moderate LVH with some asymmetric septal hypertrophy, RV normal size and systolic function. EF 50-55% by LV-gram in 6/12.   . Degeneration of lumbar or lumbosacral intervertebral disc   . Polyneuropathy in diabetes(357.2)   . Thoracic or lumbosacral neuritis or radiculitis, unspecified   . Calcifying tendinitis of shoulder   . LBBB (left bundle branch block)   . Chronic combined systolic and diastolic CHF (congestive heart failure)     EF 40-45% by echo 12/06/2012  . Coronary artery disease     questionable. LHC 05/2011 showing normal coronaries // Followed at [facility], Dr. [facility]  . GERD (gastroesophageal reflux disease)     Assessment: 5 YOF presented with CVA symptoms, UA was dirty and patient w/ increased urinary frequency, Pharmacy is consulted to dose rocephin for possible UTI. Afebrile, wbc wnl.    Goal of Therapy:  Elimination of infection  Plan:  Rocephin 1g IV Q 24 hrs, no renal adjustment needed. Pharmacy sign off, please re-consult if other antibiotics are needed.  57, PharmD, BCPS  Clinical Pharmacist  Pager: [phone]   01/09/2014,6:58 PM

## 2014-01-09 NOTE — ED Notes (Signed)
Pt ambulated to restroom with no difficulities.

## 2014-01-09 NOTE — ED Provider Notes (Signed)
Medical screening examination/treatment/procedure(s) were performed by non-physician practitioner and as supervising physician I was immediately available for consultation/collaboration.  EKG Interpretation    Date/Time:  Wednesday January 09 2014 11:23:40 EST Ventricular Rate:  106 PR Interval:  132 QRS Duration: 113 QT Interval:  393 QTC Calculation: 522 R Axis:   169 Text Interpretation:  Atrial-sensed ventricular-paced rhythm No further analysis attempted due to paced rhythm Confirmed by DOCHERTY  MD, MEGAN ([id number]) on 01/09/2014 11:29:12 AM              Shanna Cisco, MD 01/09/14 2015

## 2014-01-09 NOTE — ED Notes (Addendum)
Report given to charge nurse on 4N by Anell Barr, RN. Pt's nurse will be [place], Charity fundraiser

## 2014-01-09 NOTE — ED Notes (Signed)
[id number] 

## 2014-01-09 NOTE — ED Provider Notes (Signed)
CSN: [id number]     Arrival date & time 01/09/14  1030 History   First MD Initiated Contact with Patient 01/09/14 1033     Chief Complaint  Patient presents with  . Cerebrovascular Accident   (Consider location/radiation/quality/duration/timing/severity/associated sxs/prior Treatment) HPI Comments: Patient is a 48 year old female with an extensive past medical history including diabetes, hypertension, CAD and CHF who presents to the emergency department with a friend complaining of "fading out for a couple weeks", increased fatigue, stress, depression, anxiety. This morning her friend states patient's speech was slurred, last night they believed that he noticed facial droop. Patient states she is tingling on both sides of her body and her left arm feels numb and weak. She believes she is having a stroke. She was recently seen in the emergency department 5 days ago with similar symptoms, increased fatigue, normal neurologic exam noted at that time. Her workup did not show any acute findings. She did not have a head CT at that time. She had CT angiograms of her head and neck in October 2014 which did not show any significant vascular stenosis. Patient is very non-specific in her history, poor historian.  Patient is a 48 y.o. female presenting with Acute Neurological Problem. The history is provided by the patient and a friend.  Cerebrovascular Accident Associated symptoms include fatigue, numbness and weakness.    Past Medical History  Diagnosis Date  . Hypertension     Poorly controlled. Has had HTN since age 64. Angioedema with ACEI.  24 Hr urine and renal arterial dopplers ordered . . . Never done  . Type II or unspecified type diabetes mellitus without mention of complication, uncontrolled DX: 2002  . NICM (nonischemic cardiomyopathy)     EF 45-50% in 8/12, cath 6/12 showed normal coronaries, EF 50-55% by LV gram  . Morbid obesity   . Allergic rhinitis   . HLD (hyperlipidemia)   . OSA on  CPAP     sleep study in 8/12 showed moderate to severe OSA requiring CPAP  . Chronic lower back pain     secondary to DJD, obsetiy, hip problems. Followed by Dr. Oval Linsey (pain management)  . 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 Chronic diastolic heart failure      Primarily diastolic CHF: Likely due to uncontrolled HTN. Last echo (8/12) with EF 45-50%, mild to moderate LVH with some asymmetric septal hypertrophy, RV normal size and systolic function. EF 50-55% by LV-gram in 6/12.   . Degeneration of lumbar or lumbosacral intervertebral disc   . Polyneuropathy in diabetes(357.2)   . Thoracic or lumbosacral neuritis or radiculitis, unspecified   . Calcifying tendinitis of shoulder   . LBBB (left bundle branch block)   . Chronic combined systolic and diastolic CHF (congestive heart failure)     EF 40-45% by echo 12/06/2012  . Coronary artery disease     questionable. LHC 05/2011 showing normal coronaries // Followed at [facility], Dr. Aundra Dubin  . GERD (gastroesophageal reflux disease)    Past Surgical History  Procedure Laterality Date  . Breast biopsies Bilateral 2011    patient reports benign results  . Tubal ligation  05/31/1985  . Hand surgery Left   . Shoulder surgery Right   . Dental surgery      tumors removed   . Cardiac catheterization  05/2011  . Pacemaker/defibrillator placed  08/22/13 Left 08/22/13   Family History  Problem Relation Age of Onset  . Heart  disease Mother 43    Died of MI at age 59 yo  . Heart disease Paternal Grandmother     requiring pacemaker.  [name] Heart disease Paternal Grandfather 61    Died of MI at possibly age 59-53yo  . Stroke Paternal Grandfather   . Heart disease Father 48    MI age 7yo requiring stenting  . Kidney disease Mother     requiring dialysis  . Congestive Heart Failure Mother   . Diabetes Father   . Diabetes Brother   . Heart disease Brother 66    MI at age 27 years old  . Breast cancer Paternal  Aunt   . Breast cancer Maternal Grandmother    History  Substance Use Topics  . Smoking status: Never Smoker   . Smokeless tobacco: Never Used  . Alcohol Use: No   OB History   Grav Para Term Preterm Abortions TAB SAB Ect Mult Living                 Review of Systems  Constitutional: Positive for fatigue.  Neurological: Positive for facial asymmetry, speech difficulty, weakness and numbness.  Psychiatric/Behavioral: Positive for dysphoric mood. The patient is nervous/anxious.   All other systems reviewed and are negative.    Allergies  Clindamycin; Enalapril maleate; and Lisinopril  Home Medications   Current Outpatient Rx  Name  Route  Sig  Dispense  Refill  . ACCU-CHEK FASTCLIX LANCETS MISC   Does not apply   1 each by Does not apply route 3 (three) times daily. USE UP TO 3 TIMES DAILY TO CHECK BLOOD SUGAR. DIAG CODE 250.00. INSULIN DEPENDENT   102 each   6   . albuterol (PROVENTIL HFA;VENTOLIN HFA) 108 (90 BASE) MCG/ACT inhaler   Inhalation   Inhale 2 puffs into the lungs every 6 (six) hours as needed for wheezing or shortness of breath.         [name] amLODipine (NORVASC) 5 MG tablet   Oral   Take 1 tablet (5 mg total) by mouth daily.   30 tablet   3   . aspirin EC 81 MG tablet   Oral   Take 81 mg by mouth daily.          . Blood Glucose Monitoring Suppl (ACCU-CHEK NANO SMARTVIEW) W/DEVICE KIT   Does not apply   1 each by Does not apply route 3 (three) times daily with meals.   1 kit   0   . carbamazepine (TEGRETOL) 200 MG tablet   Oral   Take 1 tablet (200 mg total) by mouth 2 (two) times daily.   60 tablet   5   . cyclobenzaprine (FLEXERIL) 10 MG tablet   Oral   Take 1 tablet (10 mg total) by mouth 3 (three) times daily as needed for muscle spasms.   30 tablet   2   . diclofenac sodium (VOLTAREN) 1 % GEL   Topical   Apply 2 g topically 4 (four) times daily as needed (for pain).   3 Tube   2   . digoxin (LANOXIN) 0.125 MG tablet   Oral    Take 1 tablet (0.125 mg total) by mouth daily.   30 tablet   11   . fluconazole (DIFLUCAN) 150 MG tablet   Oral   Take 1 tablet (150 mg total) by mouth every 3 (three) days.   3 tablet   0   . furosemide (LASIX) 40 MG tablet   Oral  Take 1 tablet (40 mg total) by mouth 2 (two) times daily.   60 tablet   5   . glucose blood (ACCU-CHEK SMARTVIEW) test strip      Use to check blood sugar 3 times per day   100 each   12     Strips for Accu-Chek Nano Smartview Glucometer.  D ...   . hydrALAZINE (APRESOLINE) 100 MG tablet   Oral   Take 1 tablet (100 mg total) by mouth 3 (three) times daily.   90 tablet   10     Please file dose change; cancel previous orders.   [name] ibuprofen (ADVIL,MOTRIN) 200 MG tablet   Oral   Take 400 mg by mouth 3 (three) times daily as needed (pain).          . insulin aspart (NOVOLOG FLEXPEN) 100 UNIT/ML injection   Subcutaneous   Inject 12-13 Units into the skin 3 (three) times daily before meals.   1 vial   11   . Insulin Glargine (LANTUS SOLOSTAR) 100 UNIT/ML Solostar Pen   Subcutaneous   Inject 27 Units into the skin at bedtime.   5 pen   11   . Insulin Pen Needle 31G X 5 MM MISC      Use to inject insulin 4 times a day. Dx code: 54.62.Insulin dependent.   120 each   11   . isosorbide mononitrate (IMDUR) 60 MG 24 hr tablet   Oral   Take 1 tablet (60 mg total) by mouth daily.   30 tablet   11   . metoprolol succinate (TOPROL-XL) 50 MG 24 hr tablet   Oral   Take 1 tablet (50 mg total) by mouth 2 (two) times daily.   60 tablet   3     Please file dose change and had to increase medica ...   . Misc. Devices (ROLLATOR) MISC      Use daily for ambulation   1 each   0   . oxyCODONE-acetaminophen (PERCOCET) 10-325 MG per tablet   Oral   Take 1 tablet by mouth every 8 (eight) hours as needed for pain. Must last 30 days. For pain   70 tablet   0   . pantoprazole (PROTONIX) 40 MG tablet   Oral   Take 1 tablet (40 mg total)  by mouth daily.   30 tablet   5   . simvastatin (ZOCOR) 40 MG tablet   Oral   Take 40 mg by mouth daily at 6 PM.         . spironolactone (ALDACTONE) 25 MG tablet   Oral   Take 1 tablet (25 mg total) by mouth daily.   90 tablet   3     Please file dose change; cancel all previous order ...    BP 123/78  Pulse 88  Temp(Src) 98.5 F (36.9 C) (Oral)  Resp 15  Ht _0  (1.753 m)  Wt 288 lb (130.636 kg)  BMI 42.51 kg/m2  SpO2 97%  LMP 12/21/2013 Physical Exam  Nursing note and vitals reviewed. Constitutional: She is oriented to person, place, and time. She appears well-developed and well-nourished. No distress.  Obese  HENT:  Head: Normocephalic and atraumatic.  Mouth/Throat: Uvula is midline, oropharynx is clear and moist and mucous membranes are normal.  Eyes: Conjunctivae and EOM are normal. Pupils are equal, round, and reactive to light.  Neck: Normal range of motion. Neck supple.  Cardiovascular: Regular rhythm, intact distal pulses and  normal pulses.  Tachycardia present.   Pulmonary/Chest: Effort normal and breath sounds normal.  Abdominal: Normal appearance and bowel sounds are normal. She exhibits no distension. There is tenderness in the suprapubic area. There is no rigidity, no rebound and no guarding.  Mild suprapubic tenderness, no peritoneal signs.  Neurological: She is alert and oriented to person, place, and time. No sensory deficit. Coordination normal. GCS eye subscore is 4. GCS verbal subscore is 5. GCS motor subscore is 6.  Left sided facial droop at mouth, subsides while talking and distraction. Left sided weakness UE/LE 3/5 compared to 5/5, able to ambulate and moves extremities normally with distraction.  Skin: She is not diaphoretic.  Psychiatric: Her behavior is normal. Her mood appears anxious. She exhibits a depressed mood.    ED Course  Procedures (including critical care time) Labs Review Labs Reviewed  URINALYSIS, ROUTINE W REFLEX  MICROSCOPIC - Abnormal; Notable for the following:    APPearance CLOUDY (*)    Glucose, UA >1000 (*)    Nitrite POSITIVE (*)    Leukocytes, UA MODERATE (*)    All other components within normal limits  COMPREHENSIVE METABOLIC PANEL - Abnormal; Notable for the following:    Sodium 134 (*)    Potassium 3.6 (*)    Glucose, Bld 311 (*)    All other components within normal limits  GLUCOSE, CAPILLARY - Abnormal; Notable for the following:    Glucose-Capillary 303 (*)    All other components within normal limits  URINE MICROSCOPIC-ADD ON - Abnormal; Notable for the following:    Squamous Epithelial / LPF FEW (*)    Bacteria, UA MANY (*)    All other components within normal limits  GLUCOSE, CAPILLARY - Abnormal; Notable for the following:    Glucose-Capillary 279 (*)    All other components within normal limits  POCT I-STAT, CHEM 8 - Abnormal; Notable for the following:    Sodium 136 (*)    Potassium 3.5 (*)    BUN 5 (*)    Glucose, Bld 319 (*)    All other components within normal limits  URINE CULTURE  CBC  URINE RAPID DRUG SCREEN (HOSP PERFORMED)  ETHANOL  PROTIME-INR  APTT  DIFFERENTIAL  TROPONIN I  POCT I-STAT TROPONIN I   Imaging Review Ct Head Wo Contrast  01/09/2014   CLINICAL DATA:  Feeling tired. Slurred speech. Left sided weakness. CVA.  EXAM: CT HEAD WITHOUT CONTRAST  TECHNIQUE: Contiguous axial images were obtained from the base of the skull through the vertex without intravenous contrast.  COMPARISON:  CTA head 10/06/2013.  FINDINGS: Asymmetric right-sided white matter hypoattenuation is similar to the prior studies. No acute cortical infarct, hemorrhage, or mass lesion is present. The ventricles are proportionate to the degree of atrophy. No significant extra-axial fluid collection is present.  The paranasal sinuses are clear. Mucosal thickening is present along the inferior aspect of the right frontal sinus, similar to the prior exam. The paranasal sinuses and mastoid  air cells are otherwise clear.  IMPRESSION: 1. Asymmetric right-sided white matter hypoattenuation is similar to the prior exam. 2. No acute intracranial abnormality. 3. Minimal sinus disease.   Electronically Signed   By: Lawrence Santiago M.D.   On: 01/09/2014 13:17    EKG Interpretation    Date/Time:  Wednesday January 09 2014 11:23:40 EST Ventricular Rate:  106 PR Interval:  132 QRS Duration: 113 QT Interval:  393 QTC Calculation: 522 R Axis:   169 Text Interpretation:  Atrial-sensed ventricular-paced  rhythm No further analysis attempted due to paced rhythm Confirmed by DOCHERTY  MD, Meeker ([id number]) on 01/09/2014 11:29:12 AM            MDM   1. Aphasia   2. UTI (urinary tract infection)      Pt presenting with possible stroke-like symptoms. Seen in ED for similar 09/2013 and 5 days ago. Neuro findings non-specific, subside with distraction. Pt under increased stress at home. Multiple risk factors for stroke. CT head without acute findings. Labs showing UTI. Troponin negative. She has a pace-maker, cannot have MRI. She has CT angio neck/head 09/2013 as stated in HPI. Neurology consult from Dr. Aram Beecham appreciated, suggested medicine admit and repeat CT in the morning. Rocephin for UTI. Admission accepted by internal medicine teaching service. Case discussed with attending Dr. Tawnya Crook who agrees with plan of care.   Illene Labrador, PA-C 01/09/14 1558

## 2014-01-09 NOTE — H&P (Signed)
Date: 01/09/2014               Patient Name:  [name] MRN: [id number]  DOB: 04-06-1966 Age / Sex: 48 y.o., female   PCP: Corky Sox, MD         Medical Service: Internal Medicine Teaching Service         Attending Physician: Dr. Axel Filler, MD    First Contact: Dr. Mechele Claude Pager: [phone]  Second Contact: Dr. Eula Fried Pager: [phone]       After Hours (After 5p/  First Contact Pager: [phone]  weekends / holidays): Second Contact Pager: [phone]   Chief Complaint: dysarthria and R sided weakness/numbness  History of Present Illness:  This is a 48yo F w/ PMH obesity, HLD, CAD, GERD, sCHF, HTN, OSA, LBBB, type 2 DM (uncontrolled), depression who presents with c/o new onset slurred speech, L facial droop, L arm weakness numbness.  Patient reports that she was in her usual state of health until last night at 10pm when she noticed slurring of her speech, HA, and difficulty making decisions and following commands. Later in the night she started noticing tingling in her R foot and weakness in her L arm/hand. These symptoms have been constant since onset. Patient also reports having some intermittent blurring of vision since last night as well. She has been eating and drinking as usual recently. Patient denies SOB, CP, URI symptoms, abd pain, dysuria. She has not had any syncope or LOC.  Of note, patient was seen in the ED 8 days ago with similar symptoms. 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 Her episode at this time was thought to be conversion disorder (recent known stressors) vs TIA.  Patient reports significant financial stressors at home recently. She does have a depressed mood recently. She is on disability and lives alone. She says she should be able to pay her rent. She has good family support in the area.  In  the ED, patient had BMP K 3.5, glucose 319. CBC wnl. UDS neg. POC troponin negative. UA showed >1000 glucose, +nitrites, many bacteria, too many WBC to count. CT head showed asymmetric right sided white matter hypoattenuation that is stable from her last CT scan in October.   Meds: Medications Prior to Admission  Medication Sig Dispense Refill  . ACCU-CHEK FASTCLIX LANCETS MISC 1 each by Does not apply route 3 (three) times daily. USE UP TO 3 TIMES DAILY TO CHECK BLOOD SUGAR. DIAG CODE 250.00. INSULIN DEPENDENT  102 each  6  . albuterol (PROVENTIL HFA;VENTOLIN HFA) 108 (90 BASE) MCG/ACT inhaler Inhale 2 puffs into the lungs every 6 (six) hours as needed for wheezing or shortness of breath.      [name] amLODipine (NORVASC) 5 MG tablet Take 1 tablet (5 mg total) by mouth daily.  30 tablet  3  . aspirin EC 81 MG tablet Take 81 mg by mouth daily.       . Blood Glucose Monitoring Suppl (ACCU-CHEK NANO SMARTVIEW) W/DEVICE KIT 1 each by Does not apply route 3 (three) times daily with meals.  1 kit  0  . carbamazepine (TEGRETOL) 200 MG tablet Take 1 tablet (200 mg total) by mouth 2 (two) times daily.  60 tablet  5  . cyclobenzaprine (FLEXERIL) 10 MG tablet Take 1 tablet (10 mg total)  by mouth 3 (three) times daily as needed for muscle spasms.  30 tablet  2  . diclofenac sodium (VOLTAREN) 1 % GEL Apply 2 g topically 4 (four) times daily as needed (for pain).  3 Tube  2  . digoxin (LANOXIN) 0.125 MG tablet Take 1 tablet (0.125 mg total) by mouth daily.  30 tablet  11  . fluconazole (DIFLUCAN) 150 MG tablet Take 1 tablet (150 mg total) by mouth every 3 (three) days.  3 tablet  0  . furosemide (LASIX) 40 MG tablet Take 1 tablet (40 mg total) by mouth 2 (two) times daily.  60 tablet  5  . glucose blood (ACCU-CHEK SMARTVIEW) test strip Use to check blood sugar 3 times per day  100 each  12  . hydrALAZINE (APRESOLINE) 100 MG tablet Take 1 tablet (100 mg total) by mouth 3 (three) times daily.  90 tablet  10  .  ibuprofen (ADVIL,MOTRIN) 200 MG tablet Take 400 mg by mouth 3 (three) times daily as needed (pain).       . insulin aspart (NOVOLOG FLEXPEN) 100 UNIT/ML injection Inject 12-13 Units into the skin 3 (three) times daily before meals.  1 vial  11  . Insulin Glargine (LANTUS SOLOSTAR) 100 UNIT/ML Solostar Pen Inject 27 Units into the skin at bedtime.  5 pen  11  . Insulin Pen Needle 31G X 5 MM MISC Use to inject insulin 4 times a day. Dx code: 21.62.Insulin dependent.  120 each  11  . isosorbide mononitrate (IMDUR) 60 MG 24 hr tablet Take 1 tablet (60 mg total) by mouth daily.  30 tablet  11  . metoprolol succinate (TOPROL-XL) 50 MG 24 hr tablet Take 1 tablet (50 mg total) by mouth 2 (two) times daily.  60 tablet  3  . Misc. Devices (ROLLATOR) MISC Use daily for ambulation  1 each  0  . oxyCODONE-acetaminophen (PERCOCET) 10-325 MG per tablet Take 1 tablet by mouth every 8 (eight) hours as needed for pain. Must last 30 days. For pain  70 tablet  0  . pantoprazole (PROTONIX) 40 MG tablet Take 1 tablet (40 mg total) by mouth daily.  30 tablet  5  . simvastatin (ZOCOR) 40 MG tablet Take 40 mg by mouth daily at 6 PM.      . spironolactone (ALDACTONE) 25 MG tablet Take 1 tablet (25 mg total) by mouth daily.  90 tablet  3    Allergies: Allergies as of 01/09/2014 - Review Complete 01/09/2014  Allergen Reaction Noted  . Clindamycin Anaphylaxis and Swelling   . Enalapril maleate Swelling   . Lisinopril Swelling    Past Medical History  Diagnosis Date  . Hypertension     Poorly controlled. Has had HTN since age 75. Angioedema with ACEI.  24 Hr urine and renal arterial dopplers ordered . . . Never done  . Type II or unspecified type diabetes mellitus without mention of complication, uncontrolled DX: 2002  . NICM (nonischemic cardiomyopathy)     EF 45-50% in 8/12, cath 6/12 showed normal coronaries, EF 50-55% by LV gram  . Morbid obesity   . Allergic rhinitis   . HLD (hyperlipidemia)   . OSA on CPAP       sleep study in 8/12 showed moderate to severe OSA requiring CPAP  . Chronic lower back pain     secondary to DJD, obsetiy, hip problems. Followed by Dr. Oval Linsey (pain management)  . 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 Chronic diastolic heart failure      Primarily diastolic CHF: Likely due to uncontrolled HTN. Last echo (8/12) with EF 45-50%, mild to moderate LVH with some asymmetric septal hypertrophy, RV normal size and systolic function. EF 50-55% by LV-gram in 6/12.   . Degeneration of lumbar or lumbosacral intervertebral disc   . Polyneuropathy in diabetes(357.2)   . Thoracic or lumbosacral neuritis or radiculitis, unspecified   . Calcifying tendinitis of shoulder   . LBBB (left bundle branch block)   . Chronic combined systolic and diastolic CHF (congestive heart failure)     EF 40-45% by echo 12/06/2012  . Coronary artery disease     questionable. LHC 05/2011 showing normal coronaries // Followed at [facility], Dr. Aundra Dubin  . GERD (gastroesophageal reflux disease)    Past Surgical History  Procedure Laterality Date  . Breast biopsies Bilateral 2011    patient reports benign results  . Tubal ligation  05/31/1985  . Hand surgery Left   . Shoulder surgery Right   . Dental surgery      tumors removed   . Cardiac catheterization  05/2011  . Pacemaker/defibrillator placed  08/22/13 Left 08/22/13   Family History  Problem Relation Age of Onset  . Heart disease Mother 35    Died of MI at age 99 yo  . Heart disease Paternal Grandmother     requiring pacemaker.  [name] Heart disease Paternal Grandfather 70    Died of MI at possibly age 31-53yo  . Stroke Paternal Grandfather   . Heart disease Father 75    MI age 48yo requiring stenting  . Kidney disease Mother     requiring dialysis  . Congestive Heart Failure Mother   . Diabetes Father   . Diabetes Brother   . Heart disease Brother 73    MI at age 67 years old  . Breast cancer Paternal Aunt    . Breast cancer Maternal Grandmother    History   Social History  . Marital Status: Single    Spouse Name: N/A    Number of Children: 2  . Years of Education: 9th grade   Occupational History  . Unemployed     planning on getting disability   Social History Main Topics  . Smoking status: Never Smoker   . Smokeless tobacco: Never Used  . Alcohol Use: No  . Drug Use: No  . Sexual Activity: Yes    Birth Control/ Protection: Surgical   Other Topics Concern  . Not on file   Social History Narrative   Lives in [place] with her son. Is able to read and write fluently in [place].    Review of Systems: General: no fevers, chills, changes in weight, changes in appetite Skin: no rash HEENT: blurry vision; no hearing changes, sore throat Pulm: no dyspnea, coughing, wheezing CV: no chest pain, palpitations, shortness of breath Abd: no abdominal pain, nausea/vomiting, diarrhea/constipation GU: see HPI Ext: see HPI  Physical Exam: Blood pressure 147/86, pulse 85, temperature 97.8 F (36.6 C), temperature source Oral, resp. rate 20, height _0  (1.753 m), weight 288 lb (130.636 kg), last menstrual period 12/21/2013, SpO2 100.00%. General: drowsy appearing, eyes closed during most of the exam, patient is intermittently uncooperative with exam HEENT: pupils equal round and reactive to light, vision grossly intact, oropharynx clear and non-erythematous; MMM  Neck: supple Lungs: clear to ascultation bilaterally, normal work of respiration, no wheezes, rales, ronchi Heart: regular rate and rhythm, no murmurs,  gallops, or rubs Abdomen: obese; soft, non-tender, non-distended, normal bowel sounds Extremities: warm; trace pedal edema b/l  Neurologic: alert & oriented X3, cranial nerves II-XII intact; patient's exam was severely limited 2/2 patient cooperation and her exam findings varied with distractions; she was able to sit up from lying flat and stand up without assistance; she at  times subjectively said she had weakness in her L arm, though was able to perform 5/5 strength on my exam on all four extremities, sensation intact to light touch throughout; negative romberg; patient was uncooperative w/ pronator drift   Lab results: Basic Metabolic Panel:  Recent Labs  01/09/14 1109 01/09/14 1135  NA 134* 136*  K 3.6* 3.5*  CL 98 100  CO2 21  --   GLUCOSE 311* 319*  BUN 7 5*  CREATININE 0.62 0.60  CALCIUM 8.5  --    Liver Function Tests:  Recent Labs  01/09/14 1109  AST 15  ALT 15  ALKPHOS 80  BILITOT 0.6  PROT 7.2  ALBUMIN 3.6   CBC:  Recent Labs  01/09/14 1109 01/09/14 1135  WBC 10.4  --   HGB 12.8 13.9  HCT 37.7 41.0  MCV 82.7  --   PLT 213  --    CBG:  Recent Labs  01/09/14 1132 01/09/14 1255  GLUCAP 303* 279*   Urine Drug Screen: Drugs of Abuse     Component Value Date/Time   LABOPIA NONE DETECTED 01/09/2014 [place] DETECTED 01/09/2014 1126   LABBENZ NONE DETECTED 01/09/2014 1126   AMPHETMU NONE DETECTED 01/09/2014 1126   THCU NONE DETECTED 01/09/2014 1126   LABBARB NONE DETECTED 01/09/2014 1126    Urinalysis:  Recent Labs  01/09/14 1126  COLORURINE YELLOW  LABSPEC 1.023  PHURINE 6.0  GLUCOSEU >1000*  HGBUR NEGATIVE  BILIRUBINUR NEGATIVE  KETONESUR NEGATIVE  PROTEINUR NEGATIVE  UROBILINOGEN 0.2  NITRITE POSITIVE*  LEUKOCYTESUR MODERATE*   Imaging results:  Ct Head Wo Contrast  01/09/2014   CLINICAL DATA:  Feeling tired. Slurred speech. Left sided weakness. CVA.  EXAM: CT HEAD WITHOUT CONTRAST  TECHNIQUE: Contiguous axial images were obtained from the base of the skull through the vertex without intravenous contrast.  COMPARISON:  CTA head 10/06/2013.  FINDINGS: Asymmetric right-sided white matter hypoattenuation is similar to the prior studies. No acute cortical infarct, hemorrhage, or mass lesion is present. The ventricles are proportionate to the degree of atrophy. No significant extra-axial fluid  collection is present.  The paranasal sinuses are clear. Mucosal thickening is present along the inferior aspect of the right frontal sinus, similar to the prior exam. The paranasal sinuses and mastoid air cells are otherwise clear.  IMPRESSION: 1. Asymmetric right-sided white matter hypoattenuation is similar to the prior exam. 2. No acute intracranial abnormality. 3. Minimal sinus disease.   Electronically Signed   By: Lawrence Santiago M.D.   On: 01/09/2014 13:17   Other results: EKG: ventricular paced rhythm, HR 106, stable LBBB, no st segment changes from prior.  Assessment & Plan by Problem:  # Dysarthria and R sided numbness tingling: Patient's symptoms are very inconsistent and change dramatically depending on situation. DDX is TIA vs CVA vs conversion disorder. However, she has been worked up a number of times in the past for similar issues and has always ruled out for stroke. CT head in ED was negative. Neurology evaluated patient and recommended repeat head CT in morning and likely discharge if no change is seen on this imaging study. Patient's  symptoms improve with distraction, which points away from a neurological etiology and more toward a psychological etiology. She does have some financial stressors at home, but nothing new recently. During her hospitalization in Oct 2014 for similar symptoms, patient was thought to have conversion disorder (given recent significant stressors) vs TIA. CTA head/neck at this time was negative for any significant arterial stenosis. Given her significant risk factors (HTN, HLD, DM) will need to rule out CVA and observe overnight. Neurology will continue to follow. -admit to telemetry -cycle troponin -appreciate neurology recs -neuro checks q2 -repeat CT in AM -continue ASA -SLP eval  # Hypokalemia: Potassium slightly low on admission, K 3.5. Supplement w/ KCl 62mq IV x 3 runs. -check BMP and Mg level in AM  #UTI: UA was dirty and patient w/ increased  urinary frequency. It is possible that her increased urinary frequency is due to glucosuria, however, unable to completely rule out symptomatic bacteriuria. Will treat UTI. -rocephin 1g IV daily  # Chronic systolic CHF: Echo done on 11/2013 showed EF 30% w/ diffuse hypokinesis. She is on amlodipine 521mdaily, digoxin, lasix 4037mID, hydralazine 100m75mD, IMDUR 60mg39mly, spironolactone 25mg 91my at home. -patient already took her home medications today so will continue home hydralazine, digoxin, IMDUR, metoprolol, lasix in the morning if she can tolerate PO -I/O -daily weights  # hx of CAD: Do not suspect ACS. Patient is on zocor, ASA, metoprolol XL 50mg B30m-will restart all home meds in morning if she passes bedside swallow  #HTN: Blood pressure stable, 120/75 on admission. She takes amlodipine, lasix, hydralazine, IMDUR, metoprolol at home. No antihypertensives overnight. Will restart tomorrow if passes swallow examination.  # Type 2 DM, uncontrolled: A1c 10.7 12/2013. Patient takes lantus 27U qHS and aspart 12-13U TID w/ meals at home. Her insulin regimen was recently increased in clinic on 1/9 given poor control of her CBGs.  -continue lantus at 12U qHS for now -SSI -continue tegretol for neuropathy  #OSA: continue CPAP  # VTE: heparin  # Diet: HH once she passes nursing bedside swallow  Code status: full  Dispo: Disposition is deferred at this time, awaiting improvement of current medical problems. Anticipated discharge in approximately 1 day(s).   The patient does have a current PCP ([facility]ork[facility]nd [facility].  The patient does not have transportation limitations that hinder transportation to clinic appointments.  Signed: Oree Mirelez Rebecca Eaton28/2015, 5:10 PM

## 2014-01-09 NOTE — Progress Notes (Signed)
Patient arrived to 4n12 at 1630, VSS, has left facial droop, slurred speech, L sided weakness. Family present and updated. Patient wanting to eat but informed her that she failed RN swallow since she could not cough on command. IMTS paged upon patients arrival to floor. Q2 neuro checks started.  Will continue to monitor.

## 2014-01-09 NOTE — ED Notes (Signed)
PA at bedside. Pt experiencing some left sided facial droop now. Able to raise eyebrows and squeeze them together. When pt squeezed eyebrows together left lip moved up.

## 2014-01-09 NOTE — ED Notes (Signed)
Pharmacy at bedside to get pt's medicine hx. Pt stating she wants them to come back later because she just can't think of what all she takes right now.

## 2014-01-09 NOTE — ED Notes (Signed)
Pt. Has c/o "fading out for a couple of weeks." Pt. reports being seen here a few weeks ago for the same s/s.  Pt. has c/o "feeling very tiered, slurred speech, and tingling on both sides of her body."

## 2014-01-09 NOTE — Progress Notes (Signed)
[facility]  Patient Status: Active (receiving services up to time of hospitalization)  [facility] is providing the following services: RN  If patient discharges after hours, please call [phone].   Jodene Nam 01/09/2014, 6:01 PM

## 2014-01-09 NOTE — Progress Notes (Signed)
Patient failed stroke swallow screen. Unable to cough on command. Made NPO until SLP bedside swallow. Order placed

## 2014-01-09 NOTE — Progress Notes (Signed)
Patient failed bedside swallow eval prior to shift. Notified MD to switch oral aspirin to rectal aspirin. Urine sample also obtained. Will continue to monitor.  Sim Boast, RN

## 2014-01-10 ENCOUNTER — Observation Stay (HOSPITAL_COMMUNITY): Payer: Medicaid Other

## 2014-01-10 ENCOUNTER — Encounter (HOSPITAL_COMMUNITY): Payer: Self-pay | Admitting: Radiology

## 2014-01-10 DIAGNOSIS — I2510 Atherosclerotic heart disease of native coronary artery without angina pectoris: Secondary | ICD-10-CM

## 2014-01-10 DIAGNOSIS — E1142 Type 2 diabetes mellitus with diabetic polyneuropathy: Secondary | ICD-10-CM

## 2014-01-10 DIAGNOSIS — IMO0001 Reserved for inherently not codable concepts without codable children: Secondary | ICD-10-CM

## 2014-01-10 DIAGNOSIS — I10 Essential (primary) hypertension: Secondary | ICD-10-CM

## 2014-01-10 DIAGNOSIS — G4733 Obstructive sleep apnea (adult) (pediatric): Secondary | ICD-10-CM

## 2014-01-10 DIAGNOSIS — E1165 Type 2 diabetes mellitus with hyperglycemia: Secondary | ICD-10-CM

## 2014-01-10 DIAGNOSIS — I5022 Chronic systolic (congestive) heart failure: Secondary | ICD-10-CM

## 2014-01-10 DIAGNOSIS — I509 Heart failure, unspecified: Secondary | ICD-10-CM

## 2014-01-10 DIAGNOSIS — R4701 Aphasia: Secondary | ICD-10-CM

## 2014-01-10 LAB — GLUCOSE, CAPILLARY
Glucose-Capillary: 221 mg/dL — ABNORMAL HIGH (ref 70–99)
Glucose-Capillary: 255 mg/dL — ABNORMAL HIGH (ref 70–99)
Glucose-Capillary: 218 mg/dL — ABNORMAL HIGH (ref 70–99)
Glucose-Capillary: 197 mg/dL — ABNORMAL HIGH (ref 70–99)

## 2014-01-10 LAB — BASIC METABOLIC PANEL
BUN: 6 mg/dL (ref 6–23)
CHLORIDE: 98 meq/L (ref 96–112)
CO2: 25 meq/L (ref 19–32)
Calcium: 8.8 mg/dL (ref 8.4–10.5)
Creatinine, Ser: 0.76 mg/dL (ref 0.50–1.10)
GFR calc non Af Amer: >90 mL/min (ref >90)
Glucose, Bld: 247 mg/dL — ABNORMAL HIGH (ref 70–99)
POTASSIUM: 3.9 meq/L (ref 3.7–5.3)
Sodium: 135 mEq/L — ABNORMAL LOW (ref 137–147)

## 2014-01-10 LAB — LIPID PANEL
Cholesterol: 187 mg/dL (ref 0–200)
HDL: 43 mg/dL (ref >39)
LDL CALC: 105 mg/dL — AB (ref 0–99)
Total CHOL/HDL Ratio: 4.3 RATIO
Triglycerides: 194 mg/dL — ABNORMAL HIGH (ref <150)
VLDL: 39 mg/dL (ref 0–40)

## 2014-01-10 LAB — TROPONIN I: Troponin I: <0.3 ng/mL (ref <0.30)

## 2014-01-10 LAB — MAGNESIUM: MAGNESIUM: 1.6 mg/dL (ref 1.5–2.5)

## 2014-01-10 MED ORDER — ASPIRIN EC 325 MG PO TBEC
325.0000 mg | DELAYED_RELEASE_TABLET | Freq: Every day | ORAL | Status: DC
  Administered 2014-01-11 – 2014-01-15 (×5): 325 mg via ORAL
  Filled 2014-01-10 (×5): qty 1

## 2014-01-10 MED ORDER — MAGNESIUM SULFATE 40 MG/ML IJ SOLN
2.0000 g | Freq: Once | INTRAMUSCULAR | Status: AC
  Administered 2014-01-10: 2 g via INTRAVENOUS
  Filled 2014-01-10: qty 50

## 2014-01-10 MED ORDER — MAGNESIUM SULFATE IN D5W 10-5 MG/ML-% IV SOLN
1.0000 g | Freq: Once | INTRAVENOUS | Status: AC
  Administered 2014-01-10: 1 g via INTRAVENOUS
  Filled 2014-01-10: qty 100

## 2014-01-10 MED ORDER — ACETAMINOPHEN 325 MG PO TABS
650.0000 mg | ORAL_TABLET | Freq: Once | ORAL | Status: AC
  Administered 2014-01-10: 650 mg via ORAL

## 2014-01-10 NOTE — Discharge Summary (Signed)
Name: [name] MRN: [id number] DOB: 12-Mar-1966 48 y.o. PCP: Corky Sox, MD  Date of Admission: 01/09/2014 10:31 AM Date of Discharge: 01/15/2014 Attending Physician: Oval Linsey, MD  Discharge Diagnosis:   Cerebrovascular accident   Type 2 diabetes mellitus, uncontrolled, with neuropathy   Chronic combined systolic and diastolic heart failure   Essential Hypertension   Hypokalemia   UTI  Discharge Medications:   Medication List    STOP taking these medications       ACCU-CHEK NANO SMARTVIEW W/DEVICE Kit     amLODipine 5 MG tablet  Commonly known as:  NORVASC     fluconazole 150 MG tablet  Commonly known as:  DIFLUCAN     hydrALAZINE 100 MG tablet  Commonly known as:  APRESOLINE     oxyCODONE-acetaminophen 10-325 MG per tablet  Commonly known as:  PERCOCET      TAKE these medications       albuterol 108 (90 BASE) MCG/ACT inhaler  Commonly known as:  PROVENTIL HFA;VENTOLIN HFA  Inhale 2 puffs into the lungs every 6 (six) hours as needed for wheezing or shortness of breath.     aspirin 325 MG EC tablet  Take 1 tablet (325 mg total) by mouth daily.     carbamazepine 200 MG tablet  Commonly known as:  TEGRETOL  Take 1 tablet (200 mg total) by mouth 2 (two) times daily.     cyclobenzaprine 10 MG tablet  Commonly known as:  FLEXERIL  Take 1 tablet (10 mg total) by mouth 3 (three) times daily as needed for muscle spasms.     diclofenac sodium 1 % Gel  Commonly known as:  VOLTAREN  Apply 2 g topically 4 (four) times daily as needed (for pain).     digoxin 0.125 MG tablet  Commonly known as:  LANOXIN  Take 1 tablet (0.125 mg total) by mouth daily.     furosemide 40 MG tablet  Commonly known as:  LASIX  Take 1 tablet (40 mg total) by mouth 2 (two) times daily.     insulin aspart 100 UNIT/ML injection  Commonly known as:  NOVOLOG FLEXPEN  Inject 2-3 Units into the skin 3 (three) times daily before meals.     Insulin Glargine 100 UNIT/ML  Solostar Pen  Commonly known as:  LANTUS SOLOSTAR  Inject 18 Units into the skin at bedtime.     isosorbide mononitrate 60 MG 24 hr tablet  Commonly known as:  IMDUR  Take 1 tablet (60 mg total) by mouth daily.     metoprolol succinate 50 MG 24 hr tablet  Commonly known as:  TOPROL-XL  Take 1 tablet (50 mg total) by mouth 2 (two) times daily.     pantoprazole 40 MG tablet  Commonly known as:  PROTONIX  Take 1 tablet (40 mg total) by mouth daily.     simvastatin 40 MG tablet  Commonly known as:  ZOCOR  Take 40 mg by mouth daily at 6 PM.     spironolactone 25 MG tablet  Commonly known as:  ALDACTONE  Take 1 tablet (25 mg total) by mouth daily.        Disposition and follow-up:   [name] was discharged from [facility] in Stable condition.  At the hospital follow up visit please address:  1. Please monitor BGs and increase insulin as needed.  She has been discharged on a much lower dose of Lantus (18 units qHS) than she usually  requires (27 units qHS).  Her Novolog dose will likely need to be increased as well.  She normally takes 12-13 units TID with meals.  2. Please add back amlodipine and hydralazine as her blood pressure tolerates.  3. Percocet is one of her home medications and I have held it at discharge since she did not require it during admission.  This may be restarted for pain as long as she is not feeling drowsy or experiencing breathing problems.  4. Ensure patient follows up with cardiology so pacemaker can be set to monitor for possible atrial fibrillation as source of stroke.  5.  Labs / imaging needed at time of follow-up: None  6.  Pending labs/ test needing follow-up: None   Follow-up Appointments:  Discharge Orders   Future Appointments Provider Department Dept Phone   01/17/2014 9:00 AM Stefano Gaul, PT [facility] [place] [facility] [phone]   [facility] [place] [facility] [phone]   01/17/2014 10:00 AM [place] [facility] [phone]   01/17/2014 11:30 AM Germain Osgood, [place] [facility] [phone]   01/17/2014 1:30 PM Gaye Pollack, [place] [facility] [phone]   01/21/2014 2:45 PM Woodroe Mode, [facility] [phone]   02/12/2014 1:45 PM Corky Sox, MD [facility] [place] [phone]   02/19/2014 9:20 AM Meredith Staggers, MD [facility] [phone]   02/28/2014 8:35 AM [facility] [place] Office [phone]   04/29/2014 2:30 PM Philmore Pali, NP [facility] [phone]   Future Orders Complete By Expires   (HEART FAILURE PATIENTS) Call MD:  Anytime you have any of the following symptoms: 1) 3 pound weight gain in 24 hours or 5 pounds in 1 week 2) shortness of breath, with or without a dry hacking cough 3) swelling in the hands, feet or stomach 4) if you have to sleep on extra pillows at night in order to breathe.  As directed    (HEART FAILURE PATIENTS) Call MD:  Anytime you have any of the following symptoms: 1) 3 pound weight gain in 24 hours or 5 pounds in 1 week 2) shortness of breath, with or without a dry hacking cough 3) swelling in the hands, feet or stomach 4) if you have to sleep on extra pillows at night in order to breathe.  As directed    Call MD for:  difficulty breathing, headache or visual disturbances  As directed    Call MD for:  difficulty breathing, headache or visual disturbances  As directed    Call MD for:  extreme fatigue  As directed    Call MD for:  extreme fatigue  As directed    Call MD for:  persistant dizziness or light-headedness  As directed    Call MD for:  persistant dizziness or light-headedness  As directed    Call MD for:  persistant nausea and vomiting  As  directed    Call MD for:  persistant nausea and vomiting  As directed    Call MD for:  severe uncontrolled pain  As directed    Call MD for:  severe uncontrolled pain  As directed    Call MD for:  temperature >100.4  As directed    Call MD for:  temperature >  100.4  As directed    Diet - low sodium heart healthy  As directed    Diet - low sodium heart healthy  As directed    Increase activity slowly  As directed    Increase activity slowly  As directed       Consultations: Treatment Team:  Md Stroke, MD  Procedures Performed:   CTA Head/Neck 01/11/2014  IMPRESSION:  Acute right MCA infarct. Negative for hemorrhage. Occluded branch  MCA vessel to this area.  Negative CTA of the neck.  Ct Head Wo Contrast  01/10/2014   CLINICAL DATA:  Progressive weakness.  EXAM: CT HEAD WITHOUT CONTRAST  TECHNIQUE: Contiguous axial images were obtained from the base of the skull through the vertex without intravenous contrast.  COMPARISON:  CT head without contrast 01/10/2014 at 3:46 a.m.  FINDINGS: The right MCA territory infarct is more prominent on this exam than on the earlier exam, compatible with expected evolution of the infarct. This involves the right insular cortex and operculum. The basal ganglia are relatively spared.  No new territories are involved. There is no hemorrhage. Calcification along the tentorium is stable. Ventricles are of normal size. No significant extra-axial fluid collection is present.  The paranasal sinuses and mastoid air cells are clear. The osseous skull is intact.  IMPRESSION: 1. Expected evolution of right MCA territory infarct. 2. No new areas of infarct.   Electronically Signed   By: Lawrence Santiago M.D.   On: 01/10/2014 14:43   Ct Head Wo Contrast  01/10/2014   CLINICAL DATA:  Followup stroke  EXAM: CT HEAD WITHOUT CONTRAST  TECHNIQUE: Contiguous axial images were obtained from the base of the skull through the vertex without intravenous contrast.  COMPARISON:  Prior CT  from 01/09/2014  FINDINGS: Asymmetric right-sided white matter hypoattenuation is similar as compared to prior studies. There is now question of blurring of gray-white matter differentiation in the region of the right operculum, adjacent to and just superior to the right sylvian fissure (series 2, image 16). Additional subtle hypodensity seen more superiorly within the right frontal lobe (series 2, image 21). Finding is suggestive of an evolving ischemic right MCA territory infarct No intracranial hemorrhage identified. No mass lesion or midline shift. Ventricles are within normal limits.  Calvarium is intact.  Orbits are normal.  Minimal opacity again noted within the inferior aspect of the right frontal sinus, unchanged. Paranasal sinuses are otherwise clear. No mastoid effusion.  IMPRESSION: Subtle blurring of gray-white matter differentiation in the right frontal lobe and right operculum, which may reflect evolving ischemic right MCA territory infarct. No intracranial hemorrhage.   Electronically Signed   By: Jeannine Boga M.D.   On: 01/10/2014 05:19   Ct Head Wo Contrast  01/09/2014   CLINICAL DATA:  Feeling tired. Slurred speech. Left sided weakness. CVA.  EXAM: CT HEAD WITHOUT CONTRAST  TECHNIQUE: Contiguous axial images were obtained from the base of the skull through the vertex without intravenous contrast.  COMPARISON:  CTA head 10/06/2013.  FINDINGS: Asymmetric right-sided white matter hypoattenuation is similar to the prior studies. No acute cortical infarct, hemorrhage, or mass lesion is present. The ventricles are proportionate to the degree of atrophy. No significant extra-axial fluid collection is present.  The paranasal sinuses are clear. Mucosal thickening is present along the inferior aspect of the right frontal sinus, similar to the prior exam. The paranasal sinuses and mastoid air cells are otherwise clear.  IMPRESSION: 1. Asymmetric right-sided white matter hypoattenuation is similar  to the prior exam. 2. No acute intracranial abnormality. 3. Minimal sinus disease.   Electronically Signed   By: Lawrence Santiago M.D.   On: 01/09/2014 13:17   TEE:  01/14/2014 Moderately depressed LVEF (35-40%) No LA thrombus, normal appendage emptying velocity No vegetations Normal aorta Intact atrial septum; no shunt by Doppler or saline contrast study Incidental hyperechoic masses on pacemaker leads in the SVC and RA; likely thrombotic and less likely infectious.   Admission HPI:  This is a 48yo F w/ PMH obesity, HLD, CAD, GERD, sCHF, HTN, OSA, LBBB, type 2 DM (uncontrolled), depression who presents with c/o new onset slurred speech, L facial droop, L arm weakness numbness.  Patient reports that she was in her usual state of health until last night at 10pm when she noticed slurring of her speech, HA, and difficulty making decisions and following commands. Later in the night she started noticing tingling in her R foot and weakness in her L arm/hand. These symptoms have been constant since onset. Patient also reports having some intermittent blurring of vision since last night as well. She has been eating and drinking as usual recently. Patient denies SOB, CP, URI symptoms, abd pain, dysuria. She has not had any syncope or LOC.   Of note, patient was seen in the ED 8 days ago with similar symptoms. 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 Her episode at this time was thought to be conversion disorder (recent known stressors) vs TIA.   Patient reports significant financial stressors at home recently. She does have a depressed mood recently. She is on disability and lives alone. She says she should be able to pay her rent. She has good family support in the area.   In the ED, patient had BMP K 3.5, glucose 319. CBC wnl. UDS neg. POC troponin  negative. UA showed >1000 glucose, +nitrites, many bacteria, too many WBC to count. CT head showed asymmetric right sided white matter hypoattenuation that is stable from her last CT scan in October  Admission Physical Exam:  Blood pressure 147/86, pulse 85, temperature 97.8 F (36.6 C), temperature source Oral, resp. rate 20, height $RemoveBe'5\' 9"'LqvNQTdIf$  (1.753 m), weight 288 lb (130.636 kg), last menstrual period 12/21/2013, SpO2 100.00%.  General: drowsy appearing, eyes closed during most of the exam, patient is intermittently uncooperative with exam HEENT: pupils equal round and reactive to light, vision grossly intact, oropharynx clear and non-erythematous; MMM  Neck: supple Lungs: clear to ascultation bilaterally, normal work of respiration, no wheezes, rales, ronchi Heart: regular rate and rhythm, no murmurs, gallops, or rubs Abdomen: obese; soft, non-tender, non-distended, normal bowel sounds  Extremities: warm; trace pedal edema b/l  Neurologic: alert & oriented X3, cranial nerves II-XII intact; patient's exam was severely limited 2/2 patient cooperation and her exam findings varied with distractions; she was able to sit up from lying flat and stand up without assistance; she at times subjectively said she had weakness in her L arm, though was able to perform 5/5 strength on my exam on all four extremities, sensation intact to light touch throughout; negative romberg; patient was uncooperative w/ pronator drift   Admission Lab results:  Basic Metabolic Panel:   Recent Labs   01/09/14 1109  01/09/14 1135   NA  134*  136*   K  3.6*  3.5*  CL  98  100   CO2  21  --   GLUCOSE  311*  319*   BUN  7  5*   CREATININE  0.62  0.60   CALCIUM  8.5  --    Liver Function Tests:   Recent Labs   01/09/14 1109   AST  15   ALT  15   ALKPHOS  80   BILITOT  0.6   PROT  7.2   ALBUMIN  3.6    CBC:   Recent Labs   01/09/14 1109  01/09/14 1135   WBC  10.4  --   HGB  12.8  13.9   HCT  37.7  41.0     MCV  82.7  --   PLT  213  --    CBG:   Recent Labs   01/09/14 1132  01/09/14 1255   GLUCAP  303*  279*    Urine Drug Screen:  Drugs of Abuse    Component  Value  Date/Time    LABOPIA  NONE DETECTED  01/09/2014 1126    COCAINSCRNUR  NONE DETECTED  01/09/2014 1126    LABBENZ  NONE DETECTED  01/09/2014 1126    AMPHETMU  NONE DETECTED  01/09/2014 1126    THCU  NONE DETECTED  01/09/2014 1126    LABBARB  NONE DETECTED  01/09/2014 1126    Urinalysis:   Recent Labs   01/09/14 1126   [place]   LABSPEC  1.023   PHURINE  6.0   GLUCOSEU  >1000*   HGBUR  NEGATIVE   BILIRUBINUR  NEGATIVE   KETONESUR  NEGATIVE   PROTEINUR  NEGATIVE   UROBILINOGEN  0.2   NITRITE  POSITIVE*   [place] [facility] Course by problem list: CVA in R MCA territory:  Ms. Eakes presented with L arm weakness and facial droop, which progressed on first hospital night to complete L sided parlaysis (L arm, L leg, L face).  Her initial head CT on admission did not show acute abnormality.  She could not undergo MRI because she has an ICD.  Her repeat CT head the following morning showed subtle gray-white mattter differentiation in R frontal lobe and R operculum perhaps representing ischemic R sided MCA infarct. STAT repeat CT head later on 1/29 showed further evolution of R MCA territory infarct involving R insular cortex and operculum. No hemorrhage.  Neurology recommended CTA head/neck, which showed occlusion of MCA branch to the area of R MCA infarct and a normal neck CTA. TEE was done and did not reveal embolic source.  Aspirin was increased to $RemoveBefo'325mg'EUqznuzFQZD$  per Neurology recommendations.  Patient's home antihypertensives were initially held for permissive HTN but slowly added back during admission. PT/OT evaluated patient and recommend CIR at discharge.  Her L sided paralysis remained unchanged during admission.  She was discharged in stable condition to CIR with appropriate hospital follow-up  scheduled.  Follow-up is scheduled at [facility] on 04/29/2014 at 2:30PM.  Type 2 DM, uncontrolled: A1c 10.7 12/2013. Patient takes lantus 27U qHS and aspart 12-13U TID w/ meals at home. Her insulin regimen was recently increased in clinic on 1/9 given poor control of her CBGs. Initially managed on lantus 12U qHS and SSI with fair control of CBGs but this had to be increased to 14U as CBGs increased.  Increased to 18 units qHS at discharge and resumed Novolog at decreased dose: 2-3 units TID with meals (  since she had been requiring 2-3 units of SSI).  She will likely need increases in her insulin while in CIR.  Chronic systolic CHF: Patient remained euvolemic without acute exacerbation. Echo done on 11/2013 showed EF 30% with diffuse hypokinesis. TEE on 01/14/14 revealed reduced EF of 35-40%. Digoxin was continued and antihypertensives were initially held to allow for permissive hypertension.  Toprol was eventually added back during admission.  Resumed Lasix and Aldactone at discharge.  HTN: She takes amlodipine, lasix, hydralazine, IMDUR, metoprolol at home. Blood pressure stable. We initially held all antihypertensives for permissive HTN.  Toprol was eventually resumed.  Held amlodipine and hydralazine at discharge in the setting of normotension and addition of Lasix and Aldactone.  These medications will need to be resumed in CIR as her BP tolerates.  Hypokalemia, resolved: Potassium was low at admission but improved with repletion.  UTI: UA was dirty on admission and she had increased urinary frequency. It is possible that her increased urinary frequency was due to glucosuria, however, unable to completely rule out symptomatic bacteriuria. Treated UTI with rocephin 1g IV daily for 3 days.  CAD: Stable, cardiac enzymes were negative.  Lipid panel this admission showed total cholesterol 187, TG 194, LDL 105, HDL 43. Continued aspirin (325mg ), statin. Held metoprolol initially for  permissive HTN, but resumed during admission.   Discharge Vitals:   BP 135/83  Pulse 97  Temp(Src) 98.2 F (36.8 C) (Oral)  Resp 18  Ht 5\' 9"  (1.753 m)  Wt 288 lb (130.636 kg)  BMI 42.51 kg/m2  SpO2 98%  LMP 12/21/2013  Discharge Labs:  Results for orders placed during the hospital encounter of 01/09/14 (from the past 24 hour(s))  GLUCOSE, CAPILLARY     Status: Abnormal   Collection Time    01/10/14  4:31 PM      Result Value Range   Glucose-Capillary 218 (*) 70 - 99 mg/dL  GLUCOSE, CAPILLARY     Status: Abnormal   Collection Time    01/10/14  9:15 PM      Result Value Range   Glucose-Capillary 197 (*) 70 - 99 mg/dL   Comment 1 Documented in Chart     Comment 2 Notify RN    GLUCOSE, CAPILLARY     Status: Abnormal   Collection Time    01/11/14  6:25 AM      Result Value Range   Glucose-Capillary 215 (*) 70 - 99 mg/dL   Comment 1 Documented in Chart     Comment 2 Notify RN    GLUCOSE, CAPILLARY     Status: Abnormal   Collection Time    01/11/14 12:10 PM      Result Value Range   Glucose-Capillary 210 (*) 70 - 99 mg/dL    Signed: Rebecca Eaton, MD 01/11/2014, 4:16 PM   I evaluated the patient on day of discharge.  She was stable for discharge to CIR.  Appropriate follow-up has been arranged as noted above.  Duwaine Maxin, DO  Time Spent on Discharge: 35 minutes Services Ordered on Discharge: none Equipment Ordered on Discharge: none

## 2014-01-10 NOTE — Telephone Encounter (Signed)
Called into pharmacy

## 2014-01-10 NOTE — H&P (Signed)
Internal Medicine Attending Admission Note Date: 01/10/2014  Patient name: [name] Medical record number: [id number] Date of birth: 1966/10/27 Age: 48 y.o. Gender: female  I saw and evaluated the patient. I reviewed the resident's note and I agree with the resident's findings and plan as documented in the resident's note, with the following additional comments.  Chief Complaint(s): Slurred speech, left facial droop, left arm weakness  History - key components related to admission: Patient is a 48 year old woman with history of hypertension, congestive heart failure, coronary artery disease, hyperlipidemia, type 2 diabetes mellitus, and other problems as outlined in the medical history admitted with complaint of left facial and left arm weakness which has now progressed to involve her left lower extremity as well.  Physical Exam - key components related to admission:  Filed Vitals:   01/10/14 0456 01/10/14 1036 01/10/14 1513 01/10/14 1751  BP: 138/76 162/86 155/97 169/92  Pulse: 90 88 86 85  Temp: 98.4 F (36.9 C) 98.3 F (36.8 C) 98 F (36.7 C) 97.4 F (36.3 C)  TempSrc: Oral Oral Oral Oral  Resp: 18 20 18 18   Height:      Weight:      SpO2: 100% 100% 97% 98%   General: Alert, oriented Lungs: Clear Heart: Regular; no extra sounds or murmurs Abdomen: Bowel sounds present, soft, nontender Extremities: No edema Neurologic: Dense left hemiparesis   Lab results:   Basic Metabolic Panel:  Recent Labs  1109 01/09/14 1135 01/10/14 0541  NA 134* 136* 135*  K 3.6* 3.5* 3.9  CL 98 100 98  CO2 21  --  25  GLUCOSE 311* 319* 247*  BUN 7 5* 6  CREATININE 0.62 0.60 0.76  CALCIUM 8.5  --  8.8  MG  --   --  1.6    Liver Function Tests:  Recent Labs  01/09/14 1109  AST 15  ALT 15  ALKPHOS 80  BILITOT 0.6  PROT 7.2  ALBUMIN 3.6     CBC:  Recent Labs  01/09/14 1109 01/09/14 1135  WBC 10.4  --   HGB 12.8 13.9  HCT 37.7 41.0  MCV 82.7  --    PLT 213  --    Cardiac Enzymes:  Recent Labs  01/09/14 1940 01/10/14 0010  TROPONINI <0.30 <0.30     CBG:  Recent Labs  01/09/14 1255 01/09/14 1657 01/09/14 2217 01/10/14 0700 01/10/14 1202 01/10/14 1631  GLUCAP 279* 306* 303* 221* 255* 218*    Fasting Lipid Panel:  Recent Labs  01/10/14 0541  CHOL 187  HDL 43  LDLCALC 105*  TRIG 194*  CHOLHDL 4.3     Coagulation:  Recent Labs  01/09/14 1940  INR 1.05    Urine Drug Screen: Drugs of Abuse     Component Value Date/Time   LABOPIA NONE DETECTED 01/09/2014 2030   COCAINSCRNUR NONE DETECTED 01/09/2014 2030   LABBENZ NONE DETECTED 01/09/2014 2030   AMPHETMU NONE DETECTED 01/09/2014 2030   THCU NONE DETECTED 01/09/2014 2030   LABBARB NONE DETECTED 01/09/2014 2030      Urinalysis    Component Value Date/Time   COLORURINE YELLOW 01/09/2014 1126   APPEARANCEUR CLOUDY* 01/09/2014 1126   LABSPEC 1.023 01/09/2014 1126   PHURINE 6.0 01/09/2014 1126   GLUCOSEU >1000* 01/09/2014 1126   HGBUR NEGATIVE 01/09/2014 1126   BILIRUBINUR NEGATIVE 01/09/2014 1126   KETONESUR NEGATIVE 01/09/2014 1126   PROTEINUR NEGATIVE 01/09/2014 1126   UROBILINOGEN 0.2 01/09/2014 1126   NITRITE POSITIVE* 01/09/2014  1126   LEUKOCYTESUR MODERATE* 01/09/2014 1126    Urine microscopic:  Recent Labs  01/09/14 1126  EPIU FEW*  WBCU TOO NUMEROUS TO COUNT  RBCU 0-2  BACTERIA MANY*     Imaging results:  Ct Head Wo Contrast  01/10/2014   CLINICAL DATA:  Progressive weakness.  EXAM: CT HEAD WITHOUT CONTRAST  TECHNIQUE: Contiguous axial images were obtained from the base of the skull through the vertex without intravenous contrast.  COMPARISON:  CT head without contrast 01/10/2014 at 3:46 a.m.  FINDINGS: The right MCA territory infarct is more prominent on this exam than on the earlier exam, compatible with expected evolution of the infarct. This involves the right insular cortex and operculum. The basal ganglia are relatively spared.  No new  territories are involved. There is no hemorrhage. Calcification along the tentorium is stable. Ventricles are of normal size. No significant extra-axial fluid collection is present.  The paranasal sinuses and mastoid air cells are clear. The osseous skull is intact.  IMPRESSION: 1. Expected evolution of right MCA territory infarct. 2. No new areas of infarct.   Electronically Signed   By: Gennette Pac M.D.   On: 01/10/2014 14:43   Ct Head Wo Contrast  01/10/2014   CLINICAL DATA:  Followup stroke  EXAM: CT HEAD WITHOUT CONTRAST  TECHNIQUE: Contiguous axial images were obtained from the base of the skull through the vertex without intravenous contrast.  COMPARISON:  Prior CT from 01/09/2014  FINDINGS: Asymmetric right-sided white matter hypoattenuation is similar as compared to prior studies. There is now question of blurring of gray-white matter differentiation in the region of the right operculum, adjacent to and just superior to the right sylvian fissure (series 2, image 16). Additional subtle hypodensity seen more superiorly within the right frontal lobe (series 2, image 21). Finding is suggestive of an evolving ischemic right MCA territory infarct No intracranial hemorrhage identified. No mass lesion or midline shift. Ventricles are within normal limits.  Calvarium is intact.  Orbits are normal.  Minimal opacity again noted within the inferior aspect of the right frontal sinus, unchanged. Paranasal sinuses are otherwise clear. No mastoid effusion.  IMPRESSION: Subtle blurring of gray-white matter differentiation in the right frontal lobe and right operculum, which may reflect evolving ischemic right MCA territory infarct. No intracranial hemorrhage.   Electronically Signed   By: Rise Mu M.D.   On: 01/10/2014 05:19   Ct Head Wo Contrast  01/09/2014   CLINICAL DATA:  Feeling tired. Slurred speech. Left sided weakness. CVA.  EXAM: CT HEAD WITHOUT CONTRAST  TECHNIQUE: Contiguous axial images were  obtained from the base of the skull through the vertex without intravenous contrast.  COMPARISON:  CTA head 10/06/2013.  FINDINGS: Asymmetric right-sided white matter hypoattenuation is similar to the prior studies. No acute cortical infarct, hemorrhage, or mass lesion is present. The ventricles are proportionate to the degree of atrophy. No significant extra-axial fluid collection is present.  The paranasal sinuses are clear. Mucosal thickening is present along the inferior aspect of the right frontal sinus, similar to the prior exam. The paranasal sinuses and mastoid air cells are otherwise clear.  IMPRESSION: 1. Asymmetric right-sided white matter hypoattenuation is similar to the prior exam. 2. No acute intracranial abnormality. 3. Minimal sinus disease.   Electronically Signed   By: Gennette Pac M.D.   On: 01/09/2014 13:17    Other results: EKG: Atrial-sensed ventricular-paced rhythm  Assessment & Plan by Problem:  1.  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  Plans include aspirin 325 mg daily; 2-D echocardiogram; carotid Dopplers; OT/PT/speech therapy consults; neurology consulted and following.    2.  Hypokalemia.  Corrected following replacement of potassium.  Plan is follow.  3.  Urine tract infection.  Empiric antibiotics pending culture results  4.  Other problems and plans as per the resident physician's note.

## 2014-01-10 NOTE — Evaluation (Signed)
Physical Therapy Evaluation Patient Details Name: [name] MRN: [id number] DOB: [birth year] Today's Date: 01/10/2014 Time: 6213-0865 PT Time Calculation (min): 27 min  PT Assessment / Plan / Recommendation History of Present Illness  pt presents with L sided weakness and dysarrthria.  CT reveals subtle blurring of gray-white matter differentiation in the right frontal lobe and right operculum, which may reflect evolving ischemic right MCA territory infarct.  Clinical Impression  Pt near flaccid on L side and requires extensive A for all mobility.  Returned this pm to A Nsg with returning pt to bed with pt having increased difficulty with completing pivot and partially sat on bed rail.  At this time pt would benefit from CIR at D/C to maximize independence.      PT Assessment  Patient needs continued PT services    Follow Up Recommendations  CIR    Does the patient have the potential to tolerate intense rehabilitation      Barriers to Discharge        Equipment Recommendations   (TBD)    Recommendations for Other Services Rehab consult   Frequency Min 4X/week    Precautions / Restrictions Precautions Precautions: Fall Restrictions Weight Bearing Restrictions: No   Pertinent Vitals/Pain Denied pain.        Mobility  Bed Mobility Overal bed mobility: Needs Assistance Bed Mobility: Supine to Sit Supine to sit: Mod assist;HOB elevated General bed mobility comments: pt utilizes bed rail with R UE to A with bringing her trunk up to sitting.  A with L LE and L side.   Transfers Overall transfer level: Needs assistance Equipment used: 2 person hand held assist Transfers: Sit to/from [employer] Sit to Stand: Max assist;+2 physical assistance Stand pivot transfers: Max assist;+2 physical assistance General transfer comment: cues for sequencing and technique.  pt demos good strength in R side to A with mobility, but needs L LE blocked.  pt with increased  difficulty with SPT to chair as pt attempts to sit prior to completing pivot.   Modified Rankin (Stroke Patients Only) Pre-Morbid Rankin Score: No symptoms Modified Rankin: Severe disability    Exercises     PT Diagnosis: Difficulty walking;Hemiplegia non-dominant side  PT Problem List: Decreased strength;Decreased activity tolerance;Decreased balance;Decreased mobility;Decreased coordination;Decreased knowledge of use of DME;Impaired sensation;Obesity PT Treatment Interventions: DME instruction;Gait training;Functional mobility training;Therapeutic activities;Therapeutic exercise;Balance training;Neuromuscular re-education;Patient/family education     PT Goals(Current goals can be found in the care plan section) Acute Rehab PT Goals Patient Stated Goal: Back to normal PT Goal Formulation: With patient Time For Goal Achievement: 01/24/14 Potential to Achieve Goals: Good  Visit Information  Last PT Received On: 01/10/14 Assistance Needed: +2 History of Present Illness: pt presents with L sided weakness and dysarrthria.  CT reveals subtle blurring of gray-white matter differentiation in the right frontal lobe and right operculum, which may reflect evolving ischemic right MCA territory infarct.       Prior Functioning  Home Living Family/patient expects to be discharged to:: Inpatient rehab Prior Function Level of Independence: Independent Communication Communication: Expressive difficulties (slurred)    Cognition  Cognition Arousal/Alertness: Awake/alert Behavior During Therapy: WFL for tasks assessed/performed Overall Cognitive Status: Within Functional Limits for tasks assessed    Extremity/Trunk Assessment Upper Extremity Assessment Upper Extremity Assessment: Defer to OT evaluation Lower Extremity Assessment Lower Extremity Assessment: LLE deficits/detail LLE Deficits / Details: Only hip hike and minimal hip adduction.  Sensation is diminished.   LLE Sensation:  decreased light touch LLE  Coordination: decreased fine motor;decreased gross motor   Balance Balance Overall balance assessment: Needs assistance Sitting-balance support: Single extremity supported;Feet supported Sitting balance-Leahy Scale: Fair Standing balance support: Bilateral upper extremity supported Standing balance-Leahy Scale: Zero Standing balance comment: pt able to stand with 2 person ModA and L knee blocked.    End of Session PT - End of Session Equipment Utilized During Treatment: Gait belt Activity Tolerance: Patient tolerated treatment well Patient left: in chair;with call bell/phone within reach Nurse Communication: Mobility status  GP Functional Assessment Tool Used: Clinical Judgement Functional Limitation: Mobility: Walking and moving around Mobility: Walking and Moving Around Current Status (N4709): At least 80 percent but less than 100 percent impaired, limited or restricted Mobility: Walking and Moving Around Goal Status [id number]): At least 20 percent but less than 40 percent impaired, limited or restricted   Sunny Schlein, [place] [phone] 01/10/2014, 1:49 PM

## 2014-01-10 NOTE — Progress Notes (Signed)
Stroke Team Progress Note  HISTORY [name] is an 48 y.o. female, right handed, with a past medical history significant for HTN, DM, hyperlipidemia, morbid obesity, nonischemic cardiomyopathy, chronic diastolic heart failure, CAD, s/p pacemaker placement, OSA on CPAP, DJD, GERD, brought to [facility] ED for further evaluation of dysarthria with left face and arm weakness. She stated that around 10 pm last night her speech became slurred and subsequently the left face was droopier than the right face. Then, she started noticing weakness and numbness of the left arm.  Previous admission to [facility] with language impairment but unremarkable neurological work up.  Denies HA, vertigo, double vision, difficulty swallowing, confusion, or visual disturbances.  CT brain today revealed no acute abnormality.  Takes aspirin 81 mg daily.   Last known well - 10 PM 01/08/2014 No t-PA was given as the patient was out of the window for therapy.   SUBJECTIVE There no family members present. The patient is somewhat lethargic. Apparently her deficits became worse during the night. She had a CT scan of the head earlier this morning which was consistent with a possible ischemic infarct in the right middle cerebral artery territory.  OBJECTIVE Most recent Vital Signs: Filed Vitals:   01/10/14 0019 01/10/14 0215 01/10/14 0456 01/10/14 1036  BP: 133/77 124/76 138/76 162/86  Pulse: 85 82 90 88  Temp: 98.3 F (36.8 C) 98.4 F (36.9 C) 98.4 F (36.9 C) 98.3 F (36.8 C)  TempSrc: Oral Oral Oral Oral  Resp: 16 18 18 20   Height:      Weight:      SpO2: 98% 99% 100% 100%   CBG (last 3)   Recent Labs  01/09/14 2217 01/10/14 0700 01/10/14 1202  GLUCAP 303* 221* 255*    IV Fluid Intake:     MEDICATIONS  . amLODipine  5 mg Oral Daily  . aspirin EC  81 mg Oral Daily  . carbamazepine  200 mg Oral BID  . cefTRIAXone (ROCEPHIN)  IV  1 g Intravenous Q24H  . digoxin  0.125 mg Oral Daily  . furosemide  40 mg Oral  BID  . heparin  5,000 Units Subcutaneous Q8H  . hydrALAZINE  100 mg Oral TID  . insulin aspart  0-9 Units Subcutaneous TID WC  . insulin glargine  12 Units Subcutaneous QHS  . isosorbide mononitrate  60 mg Oral Daily  . metoprolol succinate  50 mg Oral BID  . pantoprazole  40 mg Oral Daily  . simvastatin  40 mg Oral q1800  . spironolactone  25 mg Oral Daily   PRN:  albuterol  Diet:  Dysphagia 1 diet with thin liquids Activity:  Up with assistance DVT Prophylaxis: Subcutaneous heparin  CLINICALLY SIGNIFICANT STUDIES Basic Metabolic Panel:   Recent Labs Lab 01/09/14 1109 01/09/14 1135 01/10/14 0541  NA 134* 136* 135*  K 3.6* 3.5* 3.9  CL 98 100 98  CO2 21  --  25  GLUCOSE 311* 319* 247*  BUN 7 5* 6  CREATININE 0.62 0.60 0.76  CALCIUM 8.5  --  8.8  MG  --   --  1.6   Liver Function Tests:   Recent Labs Lab 01/09/14 1109  AST 15  ALT 15  ALKPHOS 80  BILITOT 0.6  PROT 7.2  ALBUMIN 3.6   CBC:   Recent Labs Lab 01/09/14 1109 01/09/14 1135  WBC 10.4  --   HGB 12.8 13.9  HCT 37.7 41.0  MCV 82.7  --   PLT 213  --  Coagulation:   Recent Labs Lab 01/09/14 1940  LABPROT 13.5  INR 1.05   Cardiac Enzymes:   Recent Labs Lab 01/09/14 1940 01/10/14 0010  TROPONINI <0.30 <0.30   Urinalysis:   Recent Labs Lab 01/09/14 1126  COLORURINE YELLOW  LABSPEC 1.023  PHURINE 6.0  GLUCOSEU >1000*  HGBUR NEGATIVE  BILIRUBINUR NEGATIVE  KETONESUR NEGATIVE  PROTEINUR NEGATIVE  UROBILINOGEN 0.2  NITRITE POSITIVE*  LEUKOCYTESUR MODERATE*   Lipid Panel    Component Value Date/Time   CHOL 183 09/25/2013 0430   TRIG 164* 09/25/2013 0430   HDL 42 09/25/2013 0430   CHOLHDL 4.4 09/25/2013 0430   VLDL 33 09/25/2013 0430   LDLCALC 108* 09/25/2013 0430   HgbA1C  Lab Results  Component Value Date   HGBA1C 10.7 12/19/2013    Urine Drug Screen:     Component Value Date/Time   LABOPIA NONE DETECTED 01/09/2014 2030   COCAINSCRNUR NONE DETECTED 01/09/2014 2030    LABBENZ NONE DETECTED 01/09/2014 2030   AMPHETMU NONE DETECTED 01/09/2014 2030   THCU NONE DETECTED 01/09/2014 2030   LABBARB NONE DETECTED 01/09/2014 2030    Alcohol Level: No results found for this basename: ETH,  in the last 168 hours  Ct Head Wo Contrast 01/10/2014    Subtle blurring of gray-white matter differentiation in the right frontal lobe and right operculum, which may reflect evolving ischemic right MCA territory infarct. No intracranial hemorrhage.      Ct Head Wo Contrast 01/09/2014    1. Asymmetric right-sided white matter hypoattenuation is similar to the prior exam. 2. No acute intracranial abnormality. 3. Minimal sinus disease.     MRI of the brain  pacemaker/defibrillator  MRA of the brain  pacemaker/defibrillator  2D Echocardiogram  12/11/2013 that revealed ejection fraction of 30% with diffuse hypokinesis. No clot identified.  Carotid Doppler  Canceled  CXR  01/01/2014 - Cardiomegaly, unchanged. No acute cardiopulmonary process identified.   EKG   paced rhythm rate 79 beats per minute  Therapy Recommendations evaluation is pending  Physical Exam  -  Obese young African American lady currently not in distress.Awake alert. Afebrile. Head is nontraumatic. Neck is supple without bruit. Hearing is normal. Cardiac exam no murmur or gallop. Lungs are clear to auscultation. Distal pulses are well felt. Neurological Exam : Awake alert oriented x3. Dysarthria present. Extraocular movements are full range. Does not blink to threat as much on the left as she does on the right. Pupils equal reactive. Fundi were not visualized. Moderate left lower face and mild left upper face weakness. Tongue deviates to the left. Cough and gag weak. Significant left hemiplegia with 2/5 left upper extremity and lower extremity strength. Decreased tone on the left. Decreased sensation on the left hemibody. Left plantar is upgoing and right is downgoing. Normal strength on the right side. Gait  not tested. ASSESSMENT [name] is a 48 y.o. female presenting with left upper extremity weakness and dysarthria. TPA was not given as the patient was out of the window for treatment. A CT scan was consistent with a possible right middle cerebral artery territory infarct. The patient cannot have an MRI secondary to permanent pacemaker/defibrillator. It was felt that the infarct could possibly be embolic secondary to a low ejection fraction.  On aspirin 81 mg orally every day prior to admission. Now on aspirin 81 mg orally every day for secondary stroke prevention. Patient with resultant worsening of her deficits during the night. Work up underway.   Left ventricular  dysfunction with an ejection fraction of 30%  Status post permanent pacemaker/defibrillator  Previous stroke workup October 2014  Hypertension  Diabetes - hemoglobin A1c 10.7  Hyperlipidemia - cholesterol 183 LDL 108 in October 2014 - on Zocor prior to admission  Obesity  Coronary artery disease  Obstructive sleep apnea   Hospital day # 1  TREATMENT/PLAN  Consider increasing aspirin to 325 mg daily  Await repeat CT scan of the head  Consider 2-D echo and possible TEE to look for source of emboli secondary to low ejection fraction.  Would repeat lipid panel  Would check carotid Dopplers  Await therapy evaluations  Delton See PA-C [facility] Pager [phone] 01/10/2014, 1:35 PM  I have personally obtained a history, examined the patient, evaluated imaging results, and formulated the assessment and plan of care. I agree with the above. Delia Heady, MD

## 2014-01-10 NOTE — Progress Notes (Signed)
Rehab Admissions Coordinator Note:  Patient was screened by Clois Dupes for appropriateness for an Inpatient Acute Rehab Consult.  At this time, we are recommending Inpatient Rehab consult.  Clois Dupes 01/10/2014, 3:11 PM  I can be reached at [phone].

## 2014-01-10 NOTE — Progress Notes (Signed)
UR completed. Patient changed to inpatient- requiring IV antibiotics.  

## 2014-01-10 NOTE — Progress Notes (Addendum)
Subjective: Patient c/o worsening of her symptoms this morning. She tells me she is completely unable to move her L arm and L leg now. She continues to have L facial droop and slurred speech. No other complaints today.   Objective: Vital signs in last 24 hours: Filed Vitals:   01/10/14 0215 01/10/14 0456 01/10/14 1036 01/10/14 1513  BP: 124/76 138/76 162/86 155/97  Pulse: 82 90 88 86  Temp: 98.4 F (36.9 C) 98.4 F (36.9 C) 98.3 F (36.8 C) 98 F (36.7 C)  TempSrc: Oral Oral Oral Oral  Resp: 18 18 20 18   Height:      Weight:      SpO2: 99% 100% 100% 97%   Weight change:   Intake/Output Summary (Last 24 hours) at 01/10/14 1517 Last data filed at 01/10/14 1430  Gross per 24 hour  Intake    590 ml  Output      0 ml  Net    590 ml   General: drowsy appearing, eyes closed during most of the exam HEENT: pupils equal round and reactive to light, vision grossly intact, oropharynx clear and non-erythematous; MMM  Neck: supple Lungs: clear to ascultation bilaterally, normal work of respiration, no wheezes, rales, ronchi Heart: regular rate and rhythm, no murmurs, gallops, or rubs Abdomen: obese; soft, non-tender, non-distended, normal bowel sounds  Extremities: warm; trace pedal edema b/l  Neurologic: alert & oriented X3, speech is slurred, patient with visible L sided facial droop, tongue deviates toward L on protrusion, patient is unable to move her L arm, L leg at all- strength is 0/5 on my exam of her LUE and LLE; RUE/RLE are 5/5 throughout; sensation diminished to light touch on entire L side, though is intact on R side of her body  Lab Results: Basic Metabolic Panel:  Recent Labs Lab 01/09/14 1109 01/09/14 1135 01/10/14 0541  NA 134* 136* 135*  K 3.6* 3.5* 3.9  CL 98 100 98  CO2 21  --  25  GLUCOSE 311* 319* 247*  BUN 7 5* 6  CREATININE 0.62 0.60 0.76  CALCIUM 8.5  --  8.8  MG  --   --  1.6   Liver Function Tests:  Recent Labs Lab 01/09/14 1109  AST 15    ALT 15  ALKPHOS 80  BILITOT 0.6  PROT 7.2  ALBUMIN 3.6   CBC:  Recent Labs Lab 01/09/14 1109 01/09/14 1135  WBC 10.4  --   HGB 12.8 13.9  HCT 37.7 41.0  MCV 82.7  --   PLT 213  --    Cardiac Enzymes:  Recent Labs Lab 01/09/14 1940 01/10/14 0010  TROPONINI <0.30 <0.30   CBG:  Recent Labs Lab 01/09/14 1132 01/09/14 1255 01/09/14 1657 01/09/14 2217 01/10/14 0700 01/10/14 1202  GLUCAP 303* 279* 306* 303* 221* 255*   Coagulation:  Recent Labs Lab 01/09/14 1940  LABPROT 13.5  INR 1.05   Urine Drug Screen: Drugs of Abuse     Component Value Date/Time   LABOPIA NONE DETECTED 01/09/2014 2030   COCAINSCRNUR NONE DETECTED 01/09/2014 2030   LABBENZ NONE DETECTED 01/09/2014 2030   AMPHETMU NONE DETECTED 01/09/2014 2030   THCU NONE DETECTED 01/09/2014 2030   LABBARB NONE DETECTED 01/09/2014 2030    Urinalysis:  Recent Labs Lab 01/09/14 1126  COLORURINE YELLOW  LABSPEC 1.023  PHURINE 6.0  GLUCOSEU >1000*  HGBUR NEGATIVE  BILIRUBINUR NEGATIVE  KETONESUR NEGATIVE  PROTEINUR NEGATIVE  UROBILINOGEN 0.2  NITRITE POSITIVE*  LEUKOCYTESUR MODERATE*  Micro Results: No results found for this or any previous visit (from the past 240 hour(s)).  Studies/Results: Ct Head Wo Contrast  01/10/2014   CLINICAL DATA:  Progressive weakness.  EXAM: CT HEAD WITHOUT CONTRAST  TECHNIQUE: Contiguous axial images were obtained from the base of the skull through the vertex without intravenous contrast.  COMPARISON:  CT head without contrast 01/10/2014 at 3:46 a.m.  FINDINGS: The right MCA territory infarct is more prominent on this exam than on the earlier exam, compatible with expected evolution of the infarct. This involves the right insular cortex and operculum. The basal ganglia are relatively spared.  No new territories are involved. There is no hemorrhage. Calcification along the tentorium is stable. Ventricles are of normal size. No significant extra-axial fluid collection is  present.  The paranasal sinuses and mastoid air cells are clear. The osseous skull is intact.  IMPRESSION: 1. Expected evolution of right MCA territory infarct. 2. No new areas of infarct.   Electronically Signed   By: Gennette Pac M.D.   On: 01/10/2014 14:43   Ct Head Wo Contrast  01/10/2014   CLINICAL DATA:  Followup stroke  EXAM: CT HEAD WITHOUT CONTRAST  TECHNIQUE: Contiguous axial images were obtained from the base of the skull through the vertex without intravenous contrast.  COMPARISON:  Prior CT from 01/09/2014  FINDINGS: Asymmetric right-sided white matter hypoattenuation is similar as compared to prior studies. There is now question of blurring of gray-white matter differentiation in the region of the right operculum, adjacent to and just superior to the right sylvian fissure (series 2, image 16). Additional subtle hypodensity seen more superiorly within the right frontal lobe (series 2, image 21). Finding is suggestive of an evolving ischemic right MCA territory infarct No intracranial hemorrhage identified. No mass lesion or midline shift. Ventricles are within normal limits.  Calvarium is intact.  Orbits are normal.  Minimal opacity again noted within the inferior aspect of the right frontal sinus, unchanged. Paranasal sinuses are otherwise clear. No mastoid effusion.  IMPRESSION: Subtle blurring of gray-white matter differentiation in the right frontal lobe and right operculum, which may reflect evolving ischemic right MCA territory infarct. No intracranial hemorrhage.   Electronically Signed   By: Rise Mu M.D.   On: 01/10/2014 05:19   Ct Head Wo Contrast  01/09/2014   CLINICAL DATA:  Feeling tired. Slurred speech. Left sided weakness. CVA.  EXAM: CT HEAD WITHOUT CONTRAST  TECHNIQUE: Contiguous axial images were obtained from the base of the skull through the vertex without intravenous contrast.  COMPARISON:  CTA head 10/06/2013.  FINDINGS: Asymmetric right-sided white matter  hypoattenuation is similar to the prior studies. No acute cortical infarct, hemorrhage, or mass lesion is present. The ventricles are proportionate to the degree of atrophy. No significant extra-axial fluid collection is present.  The paranasal sinuses are clear. Mucosal thickening is present along the inferior aspect of the right frontal sinus, similar to the prior exam. The paranasal sinuses and mastoid air cells are otherwise clear.  IMPRESSION: 1. Asymmetric right-sided white matter hypoattenuation is similar to the prior exam. 2. No acute intracranial abnormality. 3. Minimal sinus disease.   Electronically Signed   By: Gennette Pac M.D.   On: 01/09/2014 13:17   Medications: I have reviewed the patient's current medications. Scheduled Meds: . amLODipine  5 mg Oral Daily  . aspirin EC  81 mg Oral Daily  . carbamazepine  200 mg Oral BID  . cefTRIAXone (ROCEPHIN)  IV  1 g  Intravenous Q24H  . digoxin  0.125 mg Oral Daily  . furosemide  40 mg Oral BID  . heparin  5,000 Units Subcutaneous Q8H  . hydrALAZINE  100 mg Oral TID  . insulin aspart  0-9 Units Subcutaneous TID WC  . insulin glargine  12 Units Subcutaneous QHS  . isosorbide mononitrate  60 mg Oral Daily  . metoprolol succinate  50 mg Oral BID  . pantoprazole  40 mg Oral Daily  . simvastatin  40 mg Oral q1800  . 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 STAT repeat CT head later on 1/29 showed further evolution of R MCA territory infarct involving R insular cortex and operculum. No hemorrhage. Troponins negative x 3. Patient with extensive evolution of symptoms overnight and now has apparently 0/5 strength on my exam. However, per nurse, patient was assisted by 2 nurses this morning to  bedside commode and though required much assistance, she was able to use the commode and nurse noted patient was not completely flaccid. Neurology saw patient and recommended: repeat lipid panel, carotid doppler, Echo, and may consider increasing ASA to 325mg  daily. Of note, her A1c 10.7 12/2013. TSH wnl 08/2013. Last lipid panel 09/2013 showed T 183, TG 164, LDL 108, HDL 48. SLP evaluated patient and recommended dysphagia 1 diet. -PT/OT evaluated patient- recommend CIR -carotid doppler -2D echo -lipid panel pending -telemetry -neuro checks q2h -ASA -holding antihypertensives for permissive HTN up to 220/120  # Hypokalemia: Resolved after 3 runs of KCl overnight. Repeat K 3.9. Mg level 1.6. Will replete Mg.   #UTI: UA was dirty and patient w/ increased urinary frequency. It is possible that her increased urinary frequency is due to glucosuria, however, unable to completely rule out symptomatic bacteriuria. Treating UTI. -continue rocephin 1g IV daily (day 2)  # Chronic systolic CHF: Echo done on 11/2013 showed EF 30% w/ diffuse hypokinesis. She is on amlodipine 5mg  daily, digoxin, lasix 40mg  BID, hydralazine 100mg  BID, IMDUR 60mg  daily, spironolactone 25mg  daily at home.  -holding antihypertensives for permissive HTN, continue digoxin -I/O  -daily weights   # hx of CAD: Do not suspect ACS. Patient is on zocor, ASA, metoprolol XL 50mg  BID. Last lipid panel 09/2013 showed T 183, TG 164, LDL 108, HDL 48. -continue ASA, statin -holding metoprolol for now   #HTN: She takes amlodipine, lasix, hydralazine, IMDUR, metoprolol at home. Blood pressure stable, 120/75 on admission and today since we held all antihypertensives, BP slightly elevated 155/97.  -holding antihypertensives  # Type 2 DM, uncontrolled: A1c 10.7 12/2013. Patient takes lantus 27U qHS and aspart 12-13U TID w/ meals at home. Her insulin regimen was recently increased in clinic on 1/9 given poor control of her CBGs.  -continue  lantus at 12U qHS  -SSI  -continue tegretol for neuropathy   #OSA: continue CPAP   # VTE: heparin   # Diet: dysphagia 1 diet per SLP  Code status: full  Dispo: Disposition is deferred at this time, awaiting improvement of current medical problems.  Anticipated discharge in approximately 2-3 day(s).   The patient does have a current PCP Courtney Paris, MD) and does not need an [facility] hospital follow-up appointment after discharge.  The patient does not have transportation limitations that hinder transportation to  clinic appointments.  .Services Needed at time of discharge: Y = Yes, Blank = No PT:   OT:   RN:   Equipment:   Other:     LOS: 1 day   Windell Hummingbird, MD 01/10/2014, 3:17 PM

## 2014-01-10 NOTE — Evaluation (Addendum)
Clinical/Bedside Swallow Evaluation Patient Details  Name: JANARIA MCCAMMON MRN: [id number] Date of Birth: 1966-08-11  Today's Date: 01/10/2014 Time: 0810-0850 SLP Time Calculation (min): 40 min  Past Medical History:  Past Medical History  Diagnosis Date  . Hypertension     Poorly controlled. Has had HTN since age 48. Angioedema with ACEI.  24 Hr urine and renal arterial dopplers ordered . . . Never done  . Type II or unspecified type diabetes mellitus without mention of complication, uncontrolled DX: 2002  . NICM (nonischemic cardiomyopathy)     EF 45-50% in 8/12, cath 6/12 showed normal coronaries, EF 50-55% by LV gram  . Morbid obesity   . Allergic rhinitis   . HLD (hyperlipidemia)   . OSA on CPAP     sleep study in 8/12 showed moderate to severe OSA requiring CPAP  . Chronic lower back pain     secondary to DJD, obsetiy, hip problems. Followed by Dr. Ivory Broad (pain management)  . 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 Chronic diastolic heart failure      Primarily diastolic CHF: Likely due to uncontrolled HTN. Last echo (8/12) with EF 45-50%, mild to moderate LVH with some asymmetric septal hypertrophy, RV normal size and systolic function. EF 50-55% by LV-gram in 6/12.   . Degeneration of lumbar or lumbosacral intervertebral disc   . Polyneuropathy in diabetes(357.2)   . Thoracic or lumbosacral neuritis or radiculitis, unspecified   . Calcifying tendinitis of shoulder   . LBBB (left bundle branch block)   . Chronic combined systolic and diastolic CHF (congestive heart failure)     EF 40-45% by echo 12/06/2012  . Coronary artery disease     questionable. LHC 05/2011 showing normal coronaries // Followed at [facility], Dr. Shirlee Latch  . GERD (gastroesophageal reflux disease)    Past Surgical History:  Past Surgical History  Procedure Laterality Date  . Breast biopsies Bilateral 2011    patient reports benign results  . Tubal ligation  05/31/1985   . Hand surgery Left   . Shoulder surgery Right   . Dental surgery      tumors removed   . Cardiac catheterization  05/2011  . Pacemaker/defibrillator placed  08/22/13 Left 08/22/13   HPI:  48 yo female adm to [facility] with dysarthria, left side numbness and weakness.  PMH + for This is a 48yo F w/ PMH obesity, HLD, CAD, GERD, sCHF, HTN, OSA, LBBB, type 2 DM (uncontrolled), depression.  Pt's CT head showed Subtle blurring of gray-white matter differentiation in the right frontal lobe and right operculum, which may reflect evolving ischemic right MCA territory infarct. No intracranial hemorrhage. Pt did not pass an RN stroke swallow screen and SLP evaluation was ordered.   Assessment / Plan / Recommendation Clinical Impression  Pt presents with clinical indications of moderate oral dysphagia, suspect pharyngeal swallow intact although pt with inability to cough on command - she can vocalize adequately.  Dense facial nerve and trigeminal nerve impacted as pt presents with facial droop, decreased labial closure and sensory impairments.   Pt also with lingual deviation to left upon protrusion.  Delayed oral transiting and anterior labial loss on left with decreased awareness.  Pt instructed to place boluses on right side of mouth to compensate for oral deficits and check for oral clearance on left.   Unfortunately after she placed cracker in her mouth and closed her eyes - ceasing mastication which increases aspiration risk  significantly with solids.  Rec puree/thin currently and hope for quick dietary advancement with improved mentation.       Aspiration Risk  Mild    Diet Recommendation Dysphagia 1 (Puree);Thin liquid   Liquid Administration via: Straw Medication Administration: Crushed with puree Supervision: Patient able to self feed;Intermittent supervision to cue for compensatory strategies Compensations: Slow rate;Small sips/bites;Check for pocketing Postural Changes and/or Swallow Maneuvers:  Seated upright 90 degrees;Upright 30-60 min after meal    Other  Recommendations Oral Care Recommendations: Oral care before and after PO   Follow Up Recommendations       Frequency and Duration min 2x/week  2 weeks   Pertinent Vitals/Pain Afebrile, decreased      Swallow Study Prior Functional Status   pt denies h/o dysphagia, she is LEFT HANDED    General Date of Onset: 01/09/14 HPI: 48 yo female adm to [facility] with dysarthria, left side numbness and weakness.  PMH + for This is a 48yo F w/ PMH obesity, HLD, CAD, GERD, sCHF, HTN, OSA, LBBB, type 2 DM (uncontrolled), depression.  Pt's CT head showed Subtle blurring of gray-white matter differentiation in the right frontal lobe and right operculum, which may reflect evolving ischemic right MCA territory infarct. No intracranial hemorrhage. Pt did not pass an RN stroke swallow screen and SLP evaluation was ordered. Type of Study: Bedside swallow evaluation Diet Prior to this Study: NPO Temperature Spikes Noted: No Respiratory Status: Room air History of Recent Intubation: No Behavior/Cognition: Lethargic;Alert;Cooperative Oral Cavity - Dentition: Dentures, not available;Adequate natural dentition (lower dentition intact) Self-Feeding Abilities: Able to feed self (pt states she is left handed and can't move left side) Patient Positioning: Upright in bed Baseline Vocal Quality: Low vocal intensity Volitional Cough:  (unable to produce) Volitional Swallow: Able to elicit    Oral/Motor/Sensory Function Labial ROM: Reduced left Labial Symmetry: Abnormal symmetry left Labial Strength: Reduced Labial Sensation: Reduced Lingual ROM: Reduced left Lingual Strength: Reduced Lingual Sensation: Reduced Facial ROM: Reduced left Facial Symmetry: Left droop (decr movement of left eyebrow) Facial Strength: Reduced Facial Sensation: Reduced   Ice Chips Ice chips: Impaired Oral Phase Impairments: Reduced labial seal;Impaired mastication;Reduced  lingual movement/coordination;Impaired anterior to posterior transit Oral Phase Functional Implications: Other (comment) (delayed transit) Pharyngeal Phase Impairments: Suspected delayed Swallow   Thin Liquid Thin Liquid: Impaired Presentation: Self Fed;Spoon;Straw;Cup Oral Phase Impairments: Reduced labial seal;Reduced lingual movement/coordination;Impaired anterior to posterior transit Oral Phase Functional Implications: Left anterior spillage;Prolonged oral transit Pharyngeal  Phase Impairments: Multiple swallows    Nectar Thick Nectar Thick Liquid: Impaired Presentation: Self Fed;Spoon;Straw;Cup Oral Phase Impairments: Reduced labial seal;Reduced lingual movement/coordination;Impaired anterior to posterior transit Oral phase functional implications: Left anterior spillage;Prolonged oral transit Pharyngeal Phase Impairments: Multiple swallows   Honey Thick Honey Thick Liquid: Not tested   Puree Puree: Impaired Presentation: Self Fed;Spoon Oral Phase Impairments: Reduced labial seal;Reduced lingual movement/coordination;Impaired anterior to posterior transit Oral Phase Functional Implications: Left anterior spillage;Left lateral sulci pocketing;Prolonged oral transit Pharyngeal Phase Impairments: Multiple swallows   Solid   GO Functional Assessment Tool Used: clinical judgement Functional Limitations: Swallowing Swallow Current Status (B2841): At least 40 percent but less than 60 percent impaired, limited or restricted Swallow Goal Status [phone]): At least 1 percent but less than 20 percent impaired, limited or restricted  Solid: Impaired Presentation: Self Fed Oral Phase Impairments: Impaired mastication;Reduced lingual movement/coordination;Reduced labial seal;Impaired anterior to posterior transit Other Comments: pt instructed to place boluses on right side of mouth, pt placed cracker in her mouth and closed  her eyes - ceasing mastication which increases aspiration risk  significantly       Donavan Burnet, MS [facility] SLP [phone]    MD Please order speech evaluation given pt apparent dysarthria.  Thanks.

## 2014-01-11 ENCOUNTER — Inpatient Hospital Stay (HOSPITAL_COMMUNITY): Payer: Medicaid Other

## 2014-01-11 ENCOUNTER — Encounter (HOSPITAL_COMMUNITY): Payer: Self-pay | Admitting: Radiology

## 2014-01-11 LAB — URINE CULTURE

## 2014-01-11 LAB — GLUCOSE, CAPILLARY
GLUCOSE-CAPILLARY: 155 mg/dL — AB (ref 70–99)
Glucose-Capillary: 215 mg/dL — ABNORMAL HIGH (ref 70–99)
Glucose-Capillary: 210 mg/dL — ABNORMAL HIGH (ref 70–99)
Glucose-Capillary: 186 mg/dL — ABNORMAL HIGH (ref 70–99)

## 2014-01-11 MED ORDER — SODIUM CHLORIDE 0.9 % IV SOLN
INTRAVENOUS | Status: DC
  Administered 2014-01-11: 18:00:00 via INTRAVENOUS
  Administered 2014-01-14: 1000 mL via INTRAVENOUS

## 2014-01-11 MED ORDER — IOHEXOL 350 MG/ML SOLN
50.0000 mL | Freq: Once | INTRAVENOUS | Status: AC | PRN
  Administered 2014-01-11: 50 mL via INTRAVENOUS

## 2014-01-11 MED ORDER — ACETAMINOPHEN 325 MG PO TABS
650.0000 mg | ORAL_TABLET | Freq: Four times a day (QID) | ORAL | Status: DC | PRN
  Administered 2014-01-11 – 2014-01-13 (×5): 650 mg via ORAL
  Filled 2014-01-11 (×5): qty 2

## 2014-01-11 MED ORDER — IOHEXOL 350 MG/ML SOLN: 100.0000 mL | Freq: Once | INTRAVENOUS | Status: DC | PRN

## 2014-01-11 NOTE — Progress Notes (Signed)
Placed pt on CPAP with nasal mask on auto titrate.  Pt vitals within normal limits and patient tolerating just fine.  RT will continue to monitor.

## 2014-01-11 NOTE — Progress Notes (Addendum)
Internal Medicine Attending  Date: 01/11/2014  Patient name: [name] Medical record number: [id number] Date of birth: 1966-06-15 Age: 48 y.o. Gender: female  I saw and evaluated the patient, and I discussed her care on A.M rounds with housestaff.  I reviewed the resident's note by Dr. Darci Needle and I agree with the resident's findings and plans as documented in her note.  Dr. Criselda Peaches will cover as attending physician on our service this weekend, and Dr. Josem Kaufmann will take over as attending physician on Monday 01/14/2014.

## 2014-01-11 NOTE — Evaluation (Signed)
Occupational Therapy Evaluation Patient Details Name: [name] MRN: [id number] DOB: 1966-01-15 Today's Date: 01/11/2014 Time: 5361-4431 OT Time Calculation (min): 41 min  OT Assessment / Plan / Recommendation History of present illness Pt presents with L sided weakness and dysarrthria.  CT reveals subtle blurring of gray-white matter differentiation in the right frontal lobe and right operculum, which may reflect evolving ischemic right MCA territory infarct.   Clinical Impression   Pt presents w/ dx as above now w/ flaccid L dominant UE & LLE impacting her ability to perform ADL's & functional transfers. Pt will benefit from intense In-pt Rehab to assist in maximizing independence w/ ADL's/self care & functional transfers.    OT Assessment  Patient needs continued OT Services    Follow Up Recommendations  CIR;Supervision/Assistance - 24 hour    Barriers to Discharge      Equipment Recommendations  Other (comment) (Defer to next venue)    Recommendations for Other Services Rehab consult  Frequency  Min 3X/week    Precautions / Restrictions Precautions Precautions: Fall Restrictions Weight Bearing Restrictions: No   Pertinent Vitals/Pain 7/10 headache, RN made aware and pt requesting pain medication. Repositioned and resting in bed.    ADL  Eating/Feeding: Simulated;Minimal assistance;Set up;Other (comment) (VC's to clear L side of mouth/sweep) Where Assessed - Eating/Feeding: Edge of bed Grooming: Performed;Wash/dry hands;Wash/dry face;Teeth care;Moderate assistance (Sitting EOB) Where Assessed - Grooming: Unsupported sitting Upper Body Bathing: Performed;Chest;Left arm;Abdomen;Moderate assistance (Sitting EOB) Where Assessed - Upper Body Bathing: Unsupported sitting Lower Body Bathing: Simulated;+1 Total assistance Where Assessed - Lower Body Bathing: Supine, head of bed flat;Rolling right and/or left Upper Body Dressing: Performed;+1 Total assistance Where  Assessed - Upper Body Dressing: Unsupported sitting Lower Body Dressing: Simulated;+2 Total assistance Where Assessed - Lower Body Dressing: Supported sit to stand;Other (comment) (Block L LE) Toilet Transfer: Simulated;+2 Total assistance Toilet Transfer Method: Stand pivot Acupuncturist: Extra wide drop arm bedside commode (Needs Extra wide drop arm commode in room) Toileting - Clothing Manipulation and Hygiene: Simulated;+2 Total assistance Where Assessed - Toileting Clothing Manipulation and Hygiene: Rolling right and/or left Tub/Shower Transfer Method: Not assessed Transfers/Ambulation Related to ADLs: Pt performed bed mobility w/ Mod-max assist x1 today. ADL Comments: Pt was educated in role of OT: She is L hand dominant w/ L side flacid at this time, c/o paresthesias/numbness throughout left side. Pt participated in ADL retraiing session with Mod A sitting EOB. Occasional vc's for shifting weight to midline. Discussed ADL's & recommendation of CIR to assist w/ maximizing I w/ ADL's/transfers and pt verbalized agreement w/ this.    OT Diagnosis: Generalized weakness;Acute pain;Hemiplegia dominant side  OT Problem List: Decreased strength;Decreased range of motion;Decreased activity tolerance;Impaired balance (sitting and/or standing);Impaired vision/perception;Decreased knowledge of use of DME or AE;Decreased coordination;Pain;Cardiopulmonary status limiting activity;Impaired UE functional use OT Treatment Interventions: Self-care/ADL training;Therapeutic exercise;DME and/or AE instruction;Patient/family education;Visual/perceptual remediation/compensation;Therapeutic activities;Balance training   OT Goals(Current goals can be found in the care plan section) Acute Rehab OT Goals Patient Stated Goal: Go back to normal Time For Goal Achievement: 01/25/14 Potential to Achieve Goals: Good  Visit Information  Last OT Received On: 01/11/14 Assistance Needed: +2 History of Present  Illness: Pt presents with L sided weakness and dysarrthria.  CT reveals subtle blurring of gray-white matter differentiation in the right frontal lobe and right operculum, which may reflect evolving ischemic right MCA territory infarct.       Prior Functioning     Home Living Family/patient expects to be discharged  to:: Inpatient rehab Prior Function Level of Independence: Independent Communication Communication: Expressive difficulties Dominant Hand: Left    Vision/Perception Vision - History Baseline Vision: Wears glasses only for reading Patient Visual Report: Blurring of vision;Nausea/blurring vision with head movement   Cognition  Cognition Arousal/Alertness: Awake/alert Behavior During Therapy: WFL for tasks assessed/performed Overall Cognitive Status: Within Functional Limits for tasks assessed    Extremity/Trunk Assessment Upper Extremity Assessment Upper Extremity Assessment: LUE deficits/detail;Generalized weakness LUE Deficits / Details: Flaccid LUE; L hand dominant; c/o paresthesias/numbness LUE/LLE and left side of face LUE Coordination: decreased fine motor;decreased gross motor Lower Extremity Assessment Lower Extremity Assessment: Defer to PT evaluation     Mobility Bed Mobility Overal bed mobility: Needs Assistance Bed Mobility: Sidelying to Sit;Sit to Sidelying Sidelying to sit: Mod assist Sit to sidelying: Mod assist General bed mobility comments: Pt utilizes bed rail with R UE to A with bringing her trunk up to sitting.  A with L LE and L side.          Balance Balance Overall balance assessment: Needs assistance Sitting-balance support: Single extremity supported;No upper extremity supported;Feet supported Sitting balance-Leahy Scale: Fair   End of Session OT - End of Session Activity Tolerance: Patient tolerated treatment well Patient left: in bed;with call bell/phone within reach Nurse Communication: Mobility status;Patient requests pain meds   GO     Alm Bustard 01/11/2014, 8:58 AM

## 2014-01-11 NOTE — Progress Notes (Signed)
Stroke Team Progress Note  HISTORY [name] is an 48 y.o. female, right handed, with a past medical history significant for HTN, DM, hyperlipidemia, morbid obesity, nonischemic cardiomyopathy, chronic diastolic heart failure, CAD, s/p pacemaker placement, OSA on CPAP, DJD, GERD, brought to [facility] ED for further evaluation of dysarthria with left face and arm weakness. She stated that around 10 pm last night her speech became slurred and subsequently the left face was droopier than the right face. Then, she started noticing weakness and numbness of the left arm.  Previous admission to [facility] with language impairment but unremarkable neurological work up.  Denies HA, vertigo, double vision, difficulty swallowing, confusion, or visual disturbances.  CT brain today revealed no acute abnormality.  Takes aspirin 81 mg daily.   Last known well - 10 PM 01/08/2014 No t-PA was given as the patient was out of the window for therapy.   SUBJECTIVE No changes. Had 2DEcho in Dec 2014 which was fine hence will cancel it.  OBJECTIVE Most recent Vital Signs: Filed Vitals:   01/10/14 2148 01/10/14 2300 01/11/14 0122 01/11/14 0610  BP:  155/94 151/84 152/92  Pulse: 92 95 86 89  Temp:  98.4 F (36.9 C) 98.7 F (37.1 C) 98 F (36.7 C)  TempSrc:  Oral Oral Oral  Resp: 18 20 18 18   Height:      Weight:      SpO2: 99% 98% 98% 98%   CBG (last 3)   Recent Labs  01/10/14 1631 01/10/14 2115 01/11/14 0625  GLUCAP 218* 197* 215*    IV Fluid Intake:     MEDICATIONS  . aspirin EC  325 mg Oral Daily  . carbamazepine  200 mg Oral BID  . cefTRIAXone (ROCEPHIN)  IV  1 g Intravenous Q24H  . digoxin  0.125 mg Oral Daily  . heparin  5,000 Units Subcutaneous Q8H  . insulin aspart  0-9 Units Subcutaneous TID WC  . insulin glargine  12 Units Subcutaneous QHS  . pantoprazole  40 mg Oral Daily  . simvastatin  40 mg Oral q1800   PRN:  acetaminophen, albuterol  Diet:  Dysphagia 1 diet with thin  liquids Activity:  BRP with assistance DVT Prophylaxis: Subcutaneous heparin  CLINICALLY SIGNIFICANT STUDIES Basic Metabolic Panel:   Recent Labs Lab 01/09/14 1109 01/09/14 1135 01/10/14 0541  NA 134* 136* 135*  K 3.6* 3.5* 3.9  CL 98 100 98  CO2 21  --  25  GLUCOSE 311* 319* 247*  BUN 7 5* 6  CREATININE 0.62 0.60 0.76  CALCIUM 8.5  --  8.8  MG  --   --  1.6   Liver Function Tests:   Recent Labs Lab 01/09/14 1109  AST 15  ALT 15  ALKPHOS 80  BILITOT 0.6  PROT 7.2  ALBUMIN 3.6   CBC:   Recent Labs Lab 01/09/14 1109 01/09/14 1135  WBC 10.4  --   HGB 12.8 13.9  HCT 37.7 41.0  MCV 82.7  --   PLT 213  --    Coagulation:   Recent Labs Lab 01/09/14 1940  LABPROT 13.5  INR 1.05   Cardiac Enzymes:   Recent Labs Lab 01/09/14 1940 01/10/14 0010  TROPONINI <0.30 <0.30   Urinalysis:   Recent Labs Lab 01/09/14 1126  COLORURINE YELLOW  LABSPEC 1.023  PHURINE 6.0  GLUCOSEU >1000*  HGBUR NEGATIVE  BILIRUBINUR NEGATIVE  KETONESUR NEGATIVE  PROTEINUR NEGATIVE  UROBILINOGEN 0.2  NITRITE POSITIVE*  LEUKOCYTESUR MODERATE*   Lipid  Panel    Component Value Date/Time   CHOL 187 01/10/2014 0541   TRIG 194* 01/10/2014 0541   HDL 43 01/10/2014 0541   CHOLHDL 4.3 01/10/2014 0541   VLDL 39 01/10/2014 0541   LDLCALC 105* 01/10/2014 0541   HgbA1C  Lab Results  Component Value Date   HGBA1C 10.7 12/19/2013    Urine Drug Screen:     Component Value Date/Time   LABOPIA NONE DETECTED 01/09/2014 2030   COCAINSCRNUR NONE DETECTED 01/09/2014 2030   LABBENZ NONE DETECTED 01/09/2014 2030   AMPHETMU NONE DETECTED 01/09/2014 2030   THCU NONE DETECTED 01/09/2014 2030   LABBARB NONE DETECTED 01/09/2014 2030    Alcohol Level: No results found for this basename: ETH,  in the last 168 hours  Ct Head Wo Contrast 01/10/2014    1. Expected evolution of right MCA territory infarct.  2. No new areas of infarct.  Ct Head Wo Contrast 01/10/2014    Subtle blurring of  gray-white matter differentiation in the right frontal lobe and right operculum, which may reflect evolving ischemic right MCA territory infarct. No intracranial hemorrhage.     Ct Head Wo Contrast 01/09/2014    1. Asymmetric right-sided white matter hypoattenuation is similar to the prior exam. 2. No acute intracranial abnormality. 3. Minimal sinus disease.    MRI/A of the brain  pacemaker/defibrillator  2D Echocardiogram  12/11/2013 that revealed ejection fraction of 30% with diffuse hypokinesis. No clot identified.  Carotid Doppler    CXR  01/01/2014 - Cardiomegaly, unchanged. No acute cardiopulmonary process identified.  EKG   paced rhythm rate 79 beats per minute  Therapy Recommendations CIR  Physical Exam  -  Obese young African American lady currently not in distress.Awake alert. Afebrile. Head is nontraumatic. Neck is supple without bruit. Hearing is normal. Cardiac exam no murmur or gallop. Lungs are clear to auscultation. Distal pulses are well felt. Neurological Exam : Awake alert oriented x3. Dysarthria present. Extraocular movements are full range. Does not blink to threat as much on the left as she does on the right. Pupils equal reactive. Fundi were not visualized. Moderate left lower face and mild left upper face weakness. Tongue deviates to the left. Cough and gag weak. Significant left hemiplegia with 15 left upper extremity and lower extremity strength. Decreased tone on the left. Decreased sensation on the left hemibody. Left plantar is upgoing and right is downgoing. Normal strength on the right side. Gait not tested.  ASSESSMENT [name] is a 48 y.o. female presenting with left upper extremity weakness and dysarthria. TPA was not given as the patient was out of the window for treatment. 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 The patient cannot have an MRI secondary to permanent  pacemaker/defibrillator. It was felt that the infarct could possibly be cardioembolic. On aspirin 81 mg orally every day prior to admission. Now on aspirin 325 mg orally every day for secondary stroke prevention. Patient with neuro worsening of her hemiparesis->hemiplegia. Work up underway.   Left ventricular dysfunction with an ejection fraction of 30%  Status post permanent pacemaker/defibrillator  Previous stroke workup October 2014  Hypertension  Diabetes - hemoglobin A1c 10.7  Hyperlipidemia - LDL 105 - on Zocor prior to admission  Obesity  Coronary artery disease  Obstructive sleep apnea  UTI, >100k GNR, culture pending   Hospital day # 2  TREATMENT/PLAN  Continue aspirin 325 mg daily  Agree with rehab consult  recommendations. Consult in place.Mobilize out of bed  Repeat 2D not indicated as just performed Dec 2014,  I do recommend a TEE to look for embolic source  DC carotid dopplers  Check CT angio brain and neck   I have personally obtained a history, examined the patient, evaluated imaging results, and formulated the assessment and plan of care. I agree with the above. Delia Heady, MD

## 2014-01-11 NOTE — Progress Notes (Signed)
PT Cancellation Note  Patient Details Name: DABRIA WADAS MRN: [id number] DOB: 13-Jun-1966   Cancelled Treatment:    Reason Eval/Treat Not Completed: Patient at procedure or test/unavailable.  Will f/u another time.     Angie Hogg, Alison Murray 01/11/2014, 11:11 AM

## 2014-01-11 NOTE — Progress Notes (Signed)
Subjective: Patient's symptoms are stable. She continues to have complete inability to use her L arm, L leg. She did have a R sided HA this morning that resolved with tylenol.  Objective: Vital signs in last 24 hours: Filed Vitals:   01/11/14 0122 01/11/14 0610 01/11/14 0927 01/11/14 1434  BP: 151/84 152/92 165/98 135/83  Pulse: 86 89 86 97  Temp: 98.7 F (37.1 C) 98 F (36.7 C) 97.4 F (36.3 C) 98.2 F (36.8 C)  TempSrc: Oral Oral Oral Oral  Resp: 18 18 18 18   Height:      Weight:      SpO2: 98% 98%  98%   Weight change:   Intake/Output Summary (Last 24 hours) at 01/11/14 1558 Last data filed at 01/10/14 1900  Gross per 24 hour  Intake    300 ml  Output      0 ml  Net    300 ml   General: drowsy appearing, eyes closed during most of the exam HEENT: pupils equal round and reactive to light, EOMI, vision grossly intact, oropharynx clear and non-erythematous; MMM  Neck: supple Lungs: clear to ascultation bilaterally, normal work of respiration, no wheezes, rales, ronchi Heart: regular rate and rhythm, no murmurs, gallops, or rubs Abdomen: obese; soft, non-tender, non-distended, normal bowel sounds  Extremities: warm; trace pedal edema b/l  Neurologic: alert & oriented X3, speech is slurred, patient with visible L sided facial droop, tongue deviates toward L on protrusion, patient is unable to move her L arm, L leg at all- strength is 0/5 on my exam of her LUE and LLE; RUE/RLE are 5/5 throughout; sensation diminished to light touch on entire L side, though is intact on R side of her body  Lab Results: Basic Metabolic Panel:  Recent Labs Lab 01/09/14 1109 01/09/14 1135 01/10/14 0541  NA 134* 136* 135*  K 3.6* 3.5* 3.9  CL 98 100 98  CO2 21  --  25  GLUCOSE 311* 319* 247*  BUN 7 5* 6  CREATININE 0.62 0.60 0.76  CALCIUM 8.5  --  8.8  MG  --   --  1.6   Liver Function Tests:  Recent Labs Lab 01/09/14 1109  AST 15  ALT 15  ALKPHOS 80  BILITOT 0.6  PROT 7.2   ALBUMIN 3.6   CBC:  Recent Labs Lab 01/09/14 1109 01/09/14 1135  WBC 10.4  --   HGB 12.8 13.9  HCT 37.7 41.0  MCV 82.7  --   PLT 213  --    Cardiac Enzymes:  Recent Labs Lab 01/09/14 1940 01/10/14 0010  TROPONINI <0.30 <0.30   CBG:  Recent Labs Lab 01/10/14 0700 01/10/14 1202 01/10/14 1631 01/10/14 2115 01/11/14 0625 01/11/14 1210  GLUCAP 221* 255* 218* 197* 215* 210*   Coagulation:  Recent Labs Lab 01/09/14 1940  LABPROT 13.5  INR 1.05   Urine Drug Screen: Drugs of Abuse     Component Value Date/Time   LABOPIA NONE DETECTED 01/09/2014 2030   COCAINSCRNUR NONE DETECTED 01/09/2014 2030   LABBENZ NONE DETECTED 01/09/2014 2030   AMPHETMU NONE DETECTED 01/09/2014 2030   THCU NONE DETECTED 01/09/2014 2030   LABBARB NONE DETECTED 01/09/2014 2030    Urinalysis:  Recent Labs Lab 01/09/14 1126  COLORURINE YELLOW  LABSPEC 1.023  PHURINE 6.0  GLUCOSEU >1000*  HGBUR NEGATIVE  BILIRUBINUR NEGATIVE  KETONESUR NEGATIVE  PROTEINUR NEGATIVE  UROBILINOGEN 0.2  NITRITE POSITIVE*  LEUKOCYTESUR MODERATE*    Micro Results: Recent Results (from the  past 240 hour(s))  URINE CULTURE     Status: None   Collection Time    01/09/14 11:26 AM      Result Value Range Status   Specimen Description URINE, RANDOM   Final   Special Requests NONE   Final   Culture  Setup Time     Final   Value: 01/09/2014 20:31     Performed at [facility] Count     Final   Value: >=100,000 COLONIES/ML     Performed at [facility]   Culture     Final   Value: ESCHERICHIA COLI     Performed at [facility]   Report Status 01/11/2014 FINAL   Final   Organism ID, Bacteria ESCHERICHIA COLI   Final    Studies/Results: Ct Angio Head W/cm &/or Wo Cm  01/11/2014   CLINICAL DATA:  Stroke  EXAM: CT ANGIOGRAPHY HEAD AND NECK  TECHNIQUE: Multidetector CT imaging of the head and neck was performed using the standard protocol during bolus administration of  intravenous contrast. Multiplanar CT image reconstructions including MIPs were obtained to evaluate the vascular anatomy. Carotid stenosis measurements (when applicable) are obtained utilizing NASCET criteria, using the distal internal carotid diameter as the denominator.  CONTRAST:  OMNIPAQUE IOHEXOL 350 MG/ML SOLN  COMPARISON:  CT head 01/10/2014  FINDINGS: CTA HEAD FINDINGS  Right MCA infarct shows progressive low-density edema but similar in distribution to yesterday. Infarct involves the insula and operculum. No associated hemorrhage. No other areas of acute infarct. Ventricle size is normal. No shift of the midline structures.  Both vertebral arteries are patent to the basilar. Posterior inferior cerebellar artery patent bilaterally. Basilar artery is widely patent. Superior cerebellar and posterior cerebral arteries are patent bilaterally.  Internal carotid artery is patent bilaterally without significant stenosis. Anterior cerebral arteries are patent bilaterally without stenosis. Left middle cerebral artery widely patent.  Right M1 segment widely patent. Right MCA bifurcation widely patent. There is an occluded branch to the area of acute infarct.  Negative for cerebral aneurysm.  Review of the MIP images confirms the above findings.  CTA NECK FINDINGS  Negative for mass or adenopathy in the neck. No acute bony change. Lung apices are clear.  Pacemaker leads are noted. Aortic arch is normal. Proximal great vessels are widely patent.  The carotid artery is widely patent bilaterally. Negative for atherosclerotic disease or dissection. No significant carotid stenosis.  Both vertebral arteries are widely patent without significant stenosis.  Review of the MIP images confirms the above findings.  IMPRESSION: Acute right MCA infarct. Negative for hemorrhage. Occluded branch MCA vessel to this area.  Negative CTA of the neck.   Electronically Signed   By: Marlan Palau M.D.   On: 01/11/2014 13:09   Ct  Head Wo Contrast  01/10/2014   CLINICAL DATA:  Progressive weakness.  EXAM: CT HEAD WITHOUT CONTRAST  TECHNIQUE: Contiguous axial images were obtained from the base of the skull through the vertex without intravenous contrast.  COMPARISON:  CT head without contrast 01/10/2014 at 3:46 a.m.  FINDINGS: The right MCA territory infarct is more prominent on this exam than on the earlier exam, compatible with expected evolution of the infarct. This involves the right insular cortex and operculum. The basal ganglia are relatively spared.  No new territories are involved. There is no hemorrhage. Calcification along the tentorium is stable. Ventricles are of normal size. No significant extra-axial fluid collection is present.  The  paranasal sinuses and mastoid air cells are clear. The osseous skull is intact.  IMPRESSION: 1. Expected evolution of right MCA territory infarct. 2. No new areas of infarct.   Electronically Signed   By: Gennette Pac M.D.   On: 01/10/2014 14:43   Ct Head Wo Contrast  01/10/2014   CLINICAL DATA:  Followup stroke  EXAM: CT HEAD WITHOUT CONTRAST  TECHNIQUE: Contiguous axial images were obtained from the base of the skull through the vertex without intravenous contrast.  COMPARISON:  Prior CT from 01/09/2014  FINDINGS: Asymmetric right-sided white matter hypoattenuation is similar as compared to prior studies. There is now question of blurring of gray-white matter differentiation in the region of the right operculum, adjacent to and just superior to the right sylvian fissure (series 2, image 16). Additional subtle hypodensity seen more superiorly within the right frontal lobe (series 2, image 21). Finding is suggestive of an evolving ischemic right MCA territory infarct No intracranial hemorrhage identified. No mass lesion or midline shift. Ventricles are within normal limits.  Calvarium is intact.  Orbits are normal.  Minimal opacity again noted within the inferior aspect of the right frontal  sinus, unchanged. Paranasal sinuses are otherwise clear. No mastoid effusion.  IMPRESSION: Subtle blurring of gray-white matter differentiation in the right frontal lobe and right operculum, which may reflect evolving ischemic right MCA territory infarct. No intracranial hemorrhage.   Electronically Signed   By: Rise Mu M.D.   On: 01/10/2014 05:19   Ct Angio Neck W/cm &/or Wo/cm  01/11/2014   CLINICAL DATA:  Stroke  EXAM: CT ANGIOGRAPHY HEAD AND NECK  TECHNIQUE: Multidetector CT imaging of the head and neck was performed using the standard protocol during bolus administration of intravenous contrast. Multiplanar CT image reconstructions including MIPs were obtained to evaluate the vascular anatomy. Carotid stenosis measurements (when applicable) are obtained utilizing NASCET criteria, using the distal internal carotid diameter as the denominator.  CONTRAST:  OMNIPAQUE IOHEXOL 350 MG/ML SOLN  COMPARISON:  CT head 01/10/2014  FINDINGS: CTA HEAD FINDINGS  Right MCA infarct shows progressive low-density edema but similar in distribution to yesterday. Infarct involves the insula and operculum. No associated hemorrhage. No other areas of acute infarct. Ventricle size is normal. No shift of the midline structures.  Both vertebral arteries are patent to the basilar. Posterior inferior cerebellar artery patent bilaterally. Basilar artery is widely patent. Superior cerebellar and posterior cerebral arteries are patent bilaterally.  Internal carotid artery is patent bilaterally without significant stenosis. Anterior cerebral arteries are patent bilaterally without stenosis. Left middle cerebral artery widely patent.  Right M1 segment widely patent. Right MCA bifurcation widely patent. There is an occluded branch to the area of acute infarct.  Negative for cerebral aneurysm.  Review of the MIP images confirms the above findings.  CTA NECK FINDINGS  Negative for mass or adenopathy in the neck. No acute bony  change. Lung apices are clear.  Pacemaker leads are noted. Aortic arch is normal. Proximal great vessels are widely patent.  The carotid artery is widely patent bilaterally. Negative for atherosclerotic disease or dissection. No significant carotid stenosis.  Both vertebral arteries are widely patent without significant stenosis.  Review of the MIP images confirms the above findings.  IMPRESSION: Acute right MCA infarct. Negative for hemorrhage. Occluded branch MCA vessel to this area.  Negative CTA of the neck.   Electronically Signed   By: Marlan Palau M.D.   On: 01/11/2014 13:09   Medications: I have reviewed the  patient's current medications. Scheduled Meds: . aspirin EC  325 mg Oral Daily  . carbamazepine  200 mg Oral BID  . cefTRIAXone (ROCEPHIN)  IV  1 g Intravenous Q24H  . digoxin  0.125 mg Oral Daily  . heparin  5,000 Units Subcutaneous Q8H  . insulin aspart  0-9 Units Subcutaneous TID WC  . insulin glargine  12 Units Subcutaneous QHS  . pantoprazole  40 mg Oral Daily  . 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 Patient's symptoms are stable from yesterday, though have progressed since her admission. Neurology would like to obtain TEE instead of 2D echo, so this was ordered and pt will be kept NPO at [facility]. Lipid panel shows Total cholesterol 187, TG 194, LDL 105, HDL 43. CIR is appropriate for patient. Will await placement. -ASA 325 -CIR- await bed -TEE, likely Monday (kept her NPO at [facility] tonight just in case this can be done on Saturday) -continue dys 1diet per SLP -telemetry -neuro checks q4h -holding antihypertensives for permissive HTN up to 220/120 (will need to touch base with neurology tomorrow to see if they want to  continue this, though BPs wnl today)  # Hypokalemia: Resolved after 3 runs of KCl overnight on 1/29. Repeat K 3.9. Mg level 1.6 on admission, so this was repleted.   # UTI: UA was dirty on admission and patient w/ increased urinary frequency. It is possible that her increased urinary frequency is due to glucosuria, however, unable to completely rule out symptomatic bacteriuria. Treating UTI. -continue rocephin 1g IV daily (day 3)  # Chronic systolic CHF: Stable, pt is euvolemic. Echo done on 11/2013 showed EF 30% w/ diffuse hypokinesis. She is on amlodipine 5mg  daily, digoxin, lasix 40mg  BID, hydralazine 100mg  BID, IMDUR 60mg  daily, spironolactone 25mg  daily at home.  -holding antihypertensives for permissive HTN, continue digoxin -I/O  -daily weights   # hx of CAD: Do not suspect ACS. Patient is on zocor, ASA, metoprolol XL 50mg  BID.  Lipid panel this admission shows total cholesterol 187, TG 194, LDL 105, HDL 43 -continue ASA, statin -holding metoprolol for now   #HTN: BP stable today at 135/83, though slightly elevated overnight to 165/98. Currently holding antihypertensives for permissive HTN. Will need to talk to neurology tomorrow if BP rises to see if we need to continue holding her medications. Of note, she takes amlodipine, lasix, hydralazine, IMDUR, metoprolol at home.   # Type 2 DM, uncontrolled: A1c 10.7 12/2013. Patient takes lantus 27U qHS and aspart 12-13U TID w/ meals at home. We have been giving lantus 12U qHS w/ SSI, CBGs 197-218.  - lantus at 12U qHS  -SSI  -continue tegretol for neuropathy   #OSA: continue CPAP   # VTE: heparin   # Diet: dysphagia 1 diet per SLP  Code status: full  Dispo: Disposition is deferred at this time, awaiting improvement of current medical problems.  Awaiting CIR placement.  The patient does have a current PCP Courtney Paris, MD) and does not need an [facility] hospital follow-up appointment after discharge.  The patient does not have  transportation limitations that hinder transportation to clinic appointments.  .Services Needed at time of discharge: Y = Yes, Blank = No PT:   OT:  RN:   Equipment:   Other:     LOS: 2 days   Windell Hummingbird, MD 01/11/2014, 3:58 PM

## 2014-01-11 NOTE — Progress Notes (Signed)
TEE scheduled for Monday w/ Dr Royann Shivers. Dayna Dunn PA-C

## 2014-01-11 NOTE — Progress Notes (Signed)
Inpatient Diabetes Program Recommendations  AACE/ADA: New Consensus Statement on Inpatient Glycemic Control (2013)  Target Ranges:  Prepandial:   less than 140 mg/dL      Peak postprandial:   less than 180 mg/dL (1-2 hours)      Critically ill patients:  140 - 180 mg/dL  Results for SARYNA, KNEELAND (MRN [id number]) as of 01/11/2014 12:55  Ref. Range 01/10/2014 12:02 01/10/2014 16:31 01/10/2014 21:15 01/11/2014 06:25 01/11/2014 12:10  Glucose-Capillary Latest Range: 70-99 mg/dL 686 (H) 168 (H) 372 (H) 215 (H) 210 (H)   Inpatient Diabetes Program Recommendations Insulin - Basal: consider increasing Lantus to home dose  Correction (SSI): consider increasing Novolog to moderate scale  Thank you  Piedad Climes BSN, RN,CDE Inpatient Diabetes Coordinator [phone] (team pager)

## 2014-01-11 NOTE — Consult Note (Signed)
Physical Medicine and Rehabilitation Consult Reason for Consult: CVA Referring Physician: Internal medicine services   HPI: [name] is a 48 y.o. right-handed female with history of uncontrolled hypertension, diabetes mellitus peripheral neuropathy, morbid obesity, nonischemic cardiomyopathy, chronic diastolic heart failure, CAD status post pacemaker placement and chronic back pain followed at outpatient rehabilitation services. Admitted 01/09/2014 with left facial weakness and slurred speech. Patient with previous admission 10/07/2013 with similar symptoms workup negative as per neurology services. Cranial CT scan consistent with possible right middle cerebral artery territory infarct. MRI not completed secondary to pacemaker. Patient did not receive TPA. Plan repeat cranial CT scan. Echocardiogram pending consideration made for TEE. Await carotid Doppler studies. Currently maintained on aspirin for CVA prophylaxis. Subcutaneous heparin for DVT prophylaxis. Maintained on dysphagia 1 thin liquid diet. Currently on Rocephin empirically for suspect UTI. Physical therapy evaluation completed 01/10/2014 with recommendations for physical medicine rehabilitation consult   Review of Systems  Respiratory: Positive for shortness of breath.   Cardiovascular: Positive for palpitations.  Gastrointestinal: Positive for constipation.  Musculoskeletal: Positive for back pain, joint pain and myalgias.  All other systems reviewed and are negative.   Past Medical History  Diagnosis Date  . Hypertension     Poorly controlled. Has had HTN since age 72. Angioedema with ACEI.  24 Hr urine and renal arterial dopplers ordered . . . Never done  . Type II or unspecified type diabetes mellitus without mention of complication, uncontrolled DX: 2002  . NICM (nonischemic cardiomyopathy)     EF 45-50% in 8/12, cath 6/12 showed normal coronaries, EF 50-55% by LV gram  . Morbid obesity   . Allergic rhinitis    . HLD (hyperlipidemia)   . OSA on CPAP     sleep study in 8/12 showed moderate to severe OSA requiring CPAP  . Chronic lower back pain     secondary to DJD, obsetiy, hip problems. Followed by Dr. Ivory Broad (pain management)  . 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 Chronic diastolic heart failure      Primarily diastolic CHF: Likely due to uncontrolled HTN. Last echo (8/12) with EF 45-50%, mild to moderate LVH with some asymmetric septal hypertrophy, RV normal size and systolic function. EF 50-55% by LV-gram in 6/12.   . Degeneration of lumbar or lumbosacral intervertebral disc   . Polyneuropathy in diabetes(357.2)   . Thoracic or lumbosacral neuritis or radiculitis, unspecified   . Calcifying tendinitis of shoulder   . LBBB (left bundle branch block)   . Chronic combined systolic and diastolic CHF (congestive heart failure)     EF 40-45% by echo 12/06/2012  . Coronary artery disease     questionable. LHC 05/2011 showing normal coronaries // Followed at [facility], Dr. Shirlee Latch  . GERD (gastroesophageal reflux disease)    Past Surgical History  Procedure Laterality Date  . Breast biopsies Bilateral 2011    patient reports benign results  . Tubal ligation  05/31/1985  . Hand surgery Left   . Shoulder surgery Right   . Dental surgery      tumors removed   . Cardiac catheterization  05/2011  . Pacemaker/defibrillator placed  08/22/13 Left 08/22/13   Family History  Problem Relation Age of Onset  . Heart disease Mother 21    Died of MI at age 47 yo  . Heart disease Paternal Grandmother     requiring pacemaker.  [name] Heart disease Paternal Grandfather  29    Died of MI at possibly age 64-53yo  . Stroke Paternal Grandfather   . Heart disease Father 52    MI age 52yo requiring stenting  . Kidney disease Mother     requiring dialysis  . Congestive Heart Failure Mother   . Diabetes Father   . Diabetes Brother   . Heart disease Brother 74    MI at age 61  years old  . Breast cancer Paternal Aunt   . Breast cancer Maternal Grandmother    Social History:  reports that she has never smoked. She has never used smokeless tobacco. She reports that she does not drink alcohol or use illicit drugs. Allergies:  Allergies  Allergen Reactions  . Clindamycin Anaphylaxis and Swelling  . Enalapril Maleate Swelling  . Lisinopril Swelling   Medications Prior to Admission  Medication Sig Dispense Refill  . amLODipine (NORVASC) 5 MG tablet Take 1 tablet (5 mg total) by mouth daily.  30 tablet  3  . aspirin EC 81 MG tablet Take 81 mg by mouth daily.       . carbamazepine (TEGRETOL) 200 MG tablet Take 1 tablet (200 mg total) by mouth 2 (two) times daily.  60 tablet  5  . cyclobenzaprine (FLEXERIL) 10 MG tablet Take 1 tablet (10 mg total) by mouth 3 (three) times daily as needed for muscle spasms.  30 tablet  2  . diclofenac sodium (VOLTAREN) 1 % GEL Apply 2 g topically 4 (four) times daily as needed (for pain).  3 Tube  2  . digoxin (LANOXIN) 0.125 MG tablet Take 1 tablet (0.125 mg total) by mouth daily.  30 tablet  11  . fluconazole (DIFLUCAN) 150 MG tablet Take 1 tablet (150 mg total) by mouth every 3 (three) days.  3 tablet  0  . furosemide (LASIX) 40 MG tablet Take 1 tablet (40 mg total) by mouth 2 (two) times daily.  60 tablet  5  . hydrALAZINE (APRESOLINE) 100 MG tablet Take 1 tablet (100 mg total) by mouth 3 (three) times daily.  90 tablet  10  . Insulin Glargine (LANTUS SOLOSTAR) 100 UNIT/ML Solostar Pen Inject 27 Units into the skin at bedtime.  5 pen  11  . isosorbide mononitrate (IMDUR) 60 MG 24 hr tablet Take 1 tablet (60 mg total) by mouth daily.  30 tablet  11  . metoprolol succinate (TOPROL-XL) 50 MG 24 hr tablet Take 1 tablet (50 mg total) by mouth 2 (two) times daily.  60 tablet  3  . pantoprazole (PROTONIX) 40 MG tablet Take 1 tablet (40 mg total) by mouth daily.  30 tablet  5  . spironolactone (ALDACTONE) 25 MG tablet Take 1 tablet (25 mg  total) by mouth daily.  90 tablet  3  . albuterol (PROVENTIL HFA;VENTOLIN HFA) 108 (90 BASE) MCG/ACT inhaler Inhale 2 puffs into the lungs every 6 (six) hours as needed for wheezing or shortness of breath.      . insulin aspart (NOVOLOG FLEXPEN) 100 UNIT/ML injection Inject 12-13 Units into the skin 3 (three) times daily before meals.  1 vial  11  . oxyCODONE-acetaminophen (PERCOCET) 10-325 MG per tablet Take 1 tablet by mouth every 8 (eight) hours as needed for pain. Must last 30 days. For pain  70 tablet  0  . simvastatin (ZOCOR) 40 MG tablet Take 40 mg by mouth daily at 6 PM.        Home: Home Living Family/patient expects to be discharged to::  Inpatient rehab  Functional History:   Functional Status:  Mobility:          ADL:    Cognition: Cognition Overall Cognitive Status: Within Functional Limits for tasks assessed Orientation Level: Oriented to person;Oriented to place;Oriented to situation;Disoriented to time Cognition Arousal/Alertness: Awake/alert Behavior During Therapy: WFL for tasks assessed/performed Overall Cognitive Status: Within Functional Limits for tasks assessed  Blood pressure 152/92, pulse 89, temperature 98 F (36.7 C), temperature source Oral, resp. rate 18, height 5\' 9"  (1.753 m), weight 130.636 kg (288 lb), last menstrual period 12/21/2013, SpO2 98.00%. Physical Exam  Vitals reviewed. Constitutional:  48 year old African American obese female  Eyes: EOM are normal.  Pupils reactive to light  Neck: Normal range of motion. Neck supple. No thyromegaly present.  Cardiovascular:  Cardiac rate controlled  Respiratory: Effort normal and breath sounds normal. No respiratory distress.  GI: Soft. Bowel sounds are normal. There is no tenderness.  Neurological: She is alert.  Speech is dysarthric but intelligible. Somewhat lethargic. She does participate with exam. She is oriented x3. Patient follows simple commands. Left facial droop and tongue deviation.  Dense left HP, (0/5). Does sense gross touch. Left inattention.   Skin: Skin is warm and dry.  Psychiatric:  Lethargic     Results for orders placed during the hospital encounter of 01/09/14 (from the past 24 hour(s))  GLUCOSE, CAPILLARY     Status: Abnormal   Collection Time    01/10/14  7:00 AM      Result Value Range   Glucose-Capillary 221 (*) 70 - 99 mg/dL   Comment 1 Notify RN     Comment 2 Documented in Chart    GLUCOSE, CAPILLARY     Status: Abnormal   Collection Time    01/10/14 12:02 PM      Result Value Range   Glucose-Capillary 255 (*) 70 - 99 mg/dL  GLUCOSE, CAPILLARY     Status: Abnormal   Collection Time    01/10/14  4:31 PM      Result Value Range   Glucose-Capillary 218 (*) 70 - 99 mg/dL  GLUCOSE, CAPILLARY     Status: Abnormal   Collection Time    01/10/14  9:15 PM      Result Value Range   Glucose-Capillary 197 (*) 70 - 99 mg/dL   Comment 1 Documented in Chart     Comment 2 Notify RN     Ct Head Wo Contrast  01/10/2014   CLINICAL DATA:  Progressive weakness.  EXAM: CT HEAD WITHOUT CONTRAST  TECHNIQUE: Contiguous axial images were obtained from the base of the skull through the vertex without intravenous contrast.  COMPARISON:  CT head without contrast 01/10/2014 at 3:46 a.m.  FINDINGS: The right MCA territory infarct is more prominent on this exam than on the earlier exam, compatible with expected evolution of the infarct. This involves the right insular cortex and operculum. The basal ganglia are relatively spared.  No new territories are involved. There is no hemorrhage. Calcification along the tentorium is stable. Ventricles are of normal size. No significant extra-axial fluid collection is present.  The paranasal sinuses and mastoid air cells are clear. The osseous skull is intact.  IMPRESSION: 1. Expected evolution of right MCA territory infarct. 2. No new areas of infarct.   Electronically Signed   By: 01/12/2014 M.D.   On: 01/10/2014 14:43   Ct Head Wo  Contrast  01/10/2014   CLINICAL DATA:  Followup stroke  EXAM: CT HEAD WITHOUT  CONTRAST  TECHNIQUE: Contiguous axial images were obtained from the base of the skull through the vertex without intravenous contrast.  COMPARISON:  Prior CT from 01/09/2014  FINDINGS: Asymmetric right-sided white matter hypoattenuation is similar as compared to prior studies. There is now question of blurring of gray-white matter differentiation in the region of the right operculum, adjacent to and just superior to the right sylvian fissure (series 2, image 16). Additional subtle hypodensity seen more superiorly within the right frontal lobe (series 2, image 21). Finding is suggestive of an evolving ischemic right MCA territory infarct No intracranial hemorrhage identified. No mass lesion or midline shift. Ventricles are within normal limits.  Calvarium is intact.  Orbits are normal.  Minimal opacity again noted within the inferior aspect of the right frontal sinus, unchanged. Paranasal sinuses are otherwise clear. No mastoid effusion.  IMPRESSION: Subtle blurring of gray-white matter differentiation in the right frontal lobe and right operculum, which may reflect evolving ischemic right MCA territory infarct. No intracranial hemorrhage.   Electronically Signed   By: Rise Mu M.D.   On: 01/10/2014 05:19   Ct Head Wo Contrast  01/09/2014   CLINICAL DATA:  Feeling tired. Slurred speech. Left sided weakness. CVA.  EXAM: CT HEAD WITHOUT CONTRAST  TECHNIQUE: Contiguous axial images were obtained from the base of the skull through the vertex without intravenous contrast.  COMPARISON:  CTA head 10/06/2013.  FINDINGS: Asymmetric right-sided white matter hypoattenuation is similar to the prior studies. No acute cortical infarct, hemorrhage, or mass lesion is present. The ventricles are proportionate to the degree of atrophy. No significant extra-axial fluid collection is present.  The paranasal sinuses are clear. Mucosal thickening  is present along the inferior aspect of the right frontal sinus, similar to the prior exam. The paranasal sinuses and mastoid air cells are otherwise clear.  IMPRESSION: 1. Asymmetric right-sided white matter hypoattenuation is similar to the prior exam. 2. No acute intracranial abnormality. 3. Minimal sinus disease.   Electronically Signed   By: Gennette Pac M.D.   On: 01/09/2014 13:17    Assessment/Plan: Diagnosis: Right MCA infarct 1. Does the need for close, 24 hr/day medical supervision in concert with the patient's rehab needs make it unreasonable for this patient to be served in a less intensive setting? Yes and Potentially 2. Co-Morbidities requiring supervision/potential complications: htn, OSA, obesity 3. Due to bladder management, bowel management, safety, skin/wound care, disease management, medication administration, pain management and patient education, does the patient require 24 hr/day rehab nursing? Yes 4. Does the patient require coordinated care of a physician, rehab nurse, PT (1-2 hrs/day, 5 days/week), OT (1-2 hrs/day, 5 days/week) and SLP (1-2 hrs/day, 5 days/week) to address physical and functional deficits in the context of the above medical diagnosis(es)? Yes Addressing deficits in the following areas: balance, endurance, locomotion, strength, transferring, bowel/bladder control, bathing, dressing, feeding, grooming, toileting, cognition, speech, language and psychosocial support 5. Can the patient actively participate in an intensive therapy program of at least 3 hrs of therapy per day at least 5 days per week? Yes 6. The potential for patient to make measurable gains while on inpatient rehab is good 7. Anticipated functional outcomes upon discharge from inpatient rehab are min to mod assist with PT, min to mod assist with OT, supervision with SLP. 8. Estimated rehab length of stay to reach the above functional goals is: 20-30 days 9. Does the patient have adequate social  supports to accommodate these discharge functional goals? Yes and Potentially 10. Anticipated  D/C setting: Home 11. Anticipated post D/C treatments: HH therapy 12. Overall Rehab/Functional Prognosis: good  RECOMMENDATIONS: This patient's condition is appropriate for continued rehabilitative care in the following setting: CIR Patient has agreed to participate in recommended program. Yes Note that insurance prior authorization may be required for reimbursement for recommended care.  Comment: Rehab Admissions Coordinator to follow up.  Thanks,  Ranelle Oyster, MD, [place] [facility]     01/11/2014

## 2014-01-11 NOTE — Progress Notes (Signed)
Speech Language Pathology Treatment: Dysphagia  Patient Details Name: [name] MRN: [id number] DOB: [birth year] Today's Date: 01/11/2014 Time: 0814-4818 SLP Time Calculation (min): 16 min  Assessment / Plan / Recommendation Clinical Impression  Pt continues with lethargy but did participate with SLP.  SLP assessed tolerance of po diet and readiness for advancement in diet.  Pt observed with thin via straw, applesauce with medicine with RN and graham cracker.  Delayed mastication of solids with pt closing her eyes and ceasing mastication before clearing oral cavity continues to be concerning for aspiration risk.  Even with moderate verbal cueing pt's lethargy negatively impacting readiness for dietary advancement.    Intermittent cough noted after liquids via straw, small single sips tolerated better.  Pt is a CNA by trade and is aware of dysphagia and her need to compensate.  Therefore recommend to continue puree/thin diet and SLP follow up on CIR for dysarthria/dysphagia management.  Using teach back SLP educated pt to recommendations and compensations, she is agreeable.    HPI HPI: 48 yo female adm to [facility] with dysarthria, left side numbness and weakness.  PMH + for This is a 48yo F w/ PMH obesity, HLD, CAD, GERD, sCHF, HTN, OSA, LBBB, type 2 DM (uncontrolled), depression.  Pt's CT head showed Subtle blurring of gray-white matter differentiation in the right frontal lobe and right operculum, which may reflect evolving ischemic right MCA territory infarct. No intracranial hemorrhage. BSE done yesterday with rec for puree/thin.     Pertinent Vitals Afebrile, decreased  SLP Plan  Continue with current plan of care    Recommendations Diet recommendations: Dysphagia 1 (puree);Thin liquid Medication Administration: Whole meds with puree Supervision: Patient able to self feed;Intermittent supervision to cue for compensatory strategies Compensations: Slow rate;Small sips/bites;Check for  pocketing Postural Changes and/or Swallow Maneuvers: Seated upright 90 degrees;Upright 30-60 min after meal              Oral Care Recommendations: Oral care before and after PO (due to dense left facial/trigeminal nerve impact) Follow up Recommendations: Inpatient Rehab Plan: Continue with current plan of care    GO     Mills Koller, MS [facility] SLP [phone]

## 2014-01-12 DIAGNOSIS — R1310 Dysphagia, unspecified: Secondary | ICD-10-CM

## 2014-01-12 DIAGNOSIS — I639 Cerebral infarction, unspecified: Secondary | ICD-10-CM | POA: Diagnosis present

## 2014-01-12 LAB — GLUCOSE, CAPILLARY
GLUCOSE-CAPILLARY: 192 mg/dL — AB (ref 70–99)
GLUCOSE-CAPILLARY: 183 mg/dL — AB (ref 70–99)
GLUCOSE-CAPILLARY: 161 mg/dL — AB (ref 70–99)
Glucose-Capillary: 177 mg/dL — ABNORMAL HIGH (ref 70–99)

## 2014-01-12 NOTE — Progress Notes (Signed)
Subjective: Patient's symptoms appear stable. She continues to have complete inability to use her L arm, L leg. She states that she is ready to get out of the hospital, but states that she knows she is staying thorough Monday for her TEE. No complaints. Denies headache.  Objective: Vital signs in last 24 hours: Filed Vitals:   01/11/14 2130 01/12/14 0155 01/12/14 0555 01/12/14 0905  BP: 161/97 139/71 163/87 142/99  Pulse: 96 93 87 85  Temp: 98.1 F (36.7 C) 98.2 F (36.8 C) 98 F (36.7 C) 98.4 F (36.9 C)  TempSrc: Oral Oral Oral Oral  Resp: 20 18 18 18   Height:      Weight:      SpO2: 98% 99% 98% 97%   Weight change:  No intake or output data in the 24 hours ending 01/12/14 1241 General: Somewhat drowsy appearing, eyes closed during most of the exam HEENT: pupils equal round and reactive to light, EOMI, vision grossly intact, oropharynx clear and non-erythematous; MMM  Neck: supple Lungs: clear to ascultation bilaterally, normal work of respiration, no wheezes, rales, ronchi Heart: regular rate and rhythm, no murmurs, gallops, or rubs Abdomen: obese; soft, non-tender, non-distended, normal bowel sounds  Extremities: warm; trace pedal edema b/l  Neurologic: alert & oriented X3, speech is slightly slurred, patient with visible L sided facial droop, tongue deviates toward L on protrusion, patient is unable to move her L arm, L leg at all- strength is 0/5 on my exam of her LUE and LLE; RUE/RLE are 5/5 throughout; sensation diminished to light touch on entire L side, though is intact on R side of her body  Lab Results: Basic Metabolic Panel:  Recent Labs Lab 01/09/14 1109 01/09/14 1135 01/10/14 0541  NA 134* 136* 135*  K 3.6* 3.5* 3.9  CL 98 100 98  CO2 21  --  25  GLUCOSE 311* 319* 247*  BUN 7 5* 6  CREATININE 0.62 0.60 0.76  CALCIUM 8.5  --  8.8  MG  --   --  1.6   Liver Function Tests:  Recent Labs Lab 01/09/14 1109  AST 15  ALT 15  ALKPHOS 80  BILITOT 0.6    PROT 7.2  ALBUMIN 3.6   CBC:  Recent Labs Lab 01/09/14 1109 01/09/14 1135  WBC 10.4  --   HGB 12.8 13.9  HCT 37.7 41.0  MCV 82.7  --   PLT 213  --    Cardiac Enzymes:  Recent Labs Lab 01/09/14 1940 01/10/14 0010  TROPONINI <0.30 <0.30   CBG:  Recent Labs Lab 01/11/14 0625 01/11/14 1210 01/11/14 1654 01/11/14 2145 01/12/14 0642 01/12/14 1208  GLUCAP 215* 210* 186* 155* 192* 183*   Coagulation:  Recent Labs Lab 01/09/14 1940  LABPROT 13.5  INR 1.05   Urine Drug Screen: Drugs of Abuse     Component Value Date/Time   LABOPIA NONE DETECTED 01/09/2014 2030   COCAINSCRNUR NONE DETECTED 01/09/2014 2030   LABBENZ NONE DETECTED 01/09/2014 2030   AMPHETMU NONE DETECTED 01/09/2014 2030   THCU NONE DETECTED 01/09/2014 2030   LABBARB NONE DETECTED 01/09/2014 2030    Urinalysis:  Recent Labs Lab 01/09/14 1126  COLORURINE YELLOW  LABSPEC 1.023  PHURINE 6.0  GLUCOSEU >1000*  HGBUR NEGATIVE  BILIRUBINUR NEGATIVE  KETONESUR NEGATIVE  PROTEINUR NEGATIVE  UROBILINOGEN 0.2  NITRITE POSITIVE*  LEUKOCYTESUR MODERATE*    Micro Results: Recent Results (from the past 240 hour(s))  URINE CULTURE     Status: None  Collection Time    01/09/14 11:26 AM      Result Value Range Status   Specimen Description URINE, RANDOM   Final   Special Requests NONE   Final   Culture  Setup Time     Final   Value: 01/09/2014 20:31     Performed at [facility] Count     Final   Value: >=100,000 COLONIES/ML     Performed at [facility]   Culture     Final   Value: ESCHERICHIA COLI     Performed at [facility]   Report Status 01/11/2014 FINAL   Final   Organism ID, Bacteria ESCHERICHIA COLI   Final    Studies/Results: Ct Angio Head W/cm &/or Wo Cm  01/11/2014   CLINICAL DATA:  Stroke  EXAM: CT ANGIOGRAPHY HEAD AND NECK  TECHNIQUE: Multidetector CT imaging of the head and neck was performed using the standard protocol during bolus  administration of intravenous contrast. Multiplanar CT image reconstructions including MIPs were obtained to evaluate the vascular anatomy. Carotid stenosis measurements (when applicable) are obtained utilizing NASCET criteria, using the distal internal carotid diameter as the denominator.  CONTRAST:  OMNIPAQUE IOHEXOL 350 MG/ML SOLN  COMPARISON:  CT head 01/10/2014  FINDINGS: CTA HEAD FINDINGS  Right MCA infarct shows progressive low-density edema but similar in distribution to yesterday. Infarct involves the insula and operculum. No associated hemorrhage. No other areas of acute infarct. Ventricle size is normal. No shift of the midline structures.  Both vertebral arteries are patent to the basilar. Posterior inferior cerebellar artery patent bilaterally. Basilar artery is widely patent. Superior cerebellar and posterior cerebral arteries are patent bilaterally.  Internal carotid artery is patent bilaterally without significant stenosis. Anterior cerebral arteries are patent bilaterally without stenosis. Left middle cerebral artery widely patent.  Right M1 segment widely patent. Right MCA bifurcation widely patent. There is an occluded branch to the area of acute infarct.  Negative for cerebral aneurysm.  Review of the MIP images confirms the above findings.  CTA NECK FINDINGS  Negative for mass or adenopathy in the neck. No acute bony change. Lung apices are clear.  Pacemaker leads are noted. Aortic arch is normal. Proximal great vessels are widely patent.  The carotid artery is widely patent bilaterally. Negative for atherosclerotic disease or dissection. No significant carotid stenosis.  Both vertebral arteries are widely patent without significant stenosis.  Review of the MIP images confirms the above findings.  IMPRESSION: Acute right MCA infarct. Negative for hemorrhage. Occluded branch MCA vessel to this area.  Negative CTA of the neck.   Electronically Signed   By: Marlan Palau M.D.   On:  01/11/2014 13:09   Ct Head Wo Contrast  01/10/2014   CLINICAL DATA:  Progressive weakness.  EXAM: CT HEAD WITHOUT CONTRAST  TECHNIQUE: Contiguous axial images were obtained from the base of the skull through the vertex without intravenous contrast.  COMPARISON:  CT head without contrast 01/10/2014 at 3:46 a.m.  FINDINGS: The right MCA territory infarct is more prominent on this exam than on the earlier exam, compatible with expected evolution of the infarct. This involves the right insular cortex and operculum. The basal ganglia are relatively spared.  No new territories are involved. There is no hemorrhage. Calcification along the tentorium is stable. Ventricles are of normal size. No significant extra-axial fluid collection is present.  The paranasal sinuses and mastoid air cells are clear. The osseous skull is intact.  IMPRESSION: 1. Expected evolution of right MCA territory infarct. 2. No new areas of infarct.   Electronically Signed   By: Gennette Pac M.D.   On: 01/10/2014 14:43   Ct Angio Neck W/cm &/or Wo/cm  01/11/2014   CLINICAL DATA:  Stroke  EXAM: CT ANGIOGRAPHY HEAD AND NECK  TECHNIQUE: Multidetector CT imaging of the head and neck was performed using the standard protocol during bolus administration of intravenous contrast. Multiplanar CT image reconstructions including MIPs were obtained to evaluate the vascular anatomy. Carotid stenosis measurements (when applicable) are obtained utilizing NASCET criteria, using the distal internal carotid diameter as the denominator.  CONTRAST:  OMNIPAQUE IOHEXOL 350 MG/ML SOLN  COMPARISON:  CT head 01/10/2014  FINDINGS: CTA HEAD FINDINGS  Right MCA infarct shows progressive low-density edema but similar in distribution to yesterday. Infarct involves the insula and operculum. No associated hemorrhage. No other areas of acute infarct. Ventricle size is normal. No shift of the midline structures.  Both vertebral arteries are patent to the basilar.  Posterior inferior cerebellar artery patent bilaterally. Basilar artery is widely patent. Superior cerebellar and posterior cerebral arteries are patent bilaterally.  Internal carotid artery is patent bilaterally without significant stenosis. Anterior cerebral arteries are patent bilaterally without stenosis. Left middle cerebral artery widely patent.  Right M1 segment widely patent. Right MCA bifurcation widely patent. There is an occluded branch to the area of acute infarct.  Negative for cerebral aneurysm.  Review of the MIP images confirms the above findings.  CTA NECK FINDINGS  Negative for mass or adenopathy in the neck. No acute bony change. Lung apices are clear.  Pacemaker leads are noted. Aortic arch is normal. Proximal great vessels are widely patent.  The carotid artery is widely patent bilaterally. Negative for atherosclerotic disease or dissection. No significant carotid stenosis.  Both vertebral arteries are widely patent without significant stenosis.  Review of the MIP images confirms the above findings.  IMPRESSION: Acute right MCA infarct. Negative for hemorrhage. Occluded branch MCA vessel to this area.  Negative CTA of the neck.   Electronically Signed   By: Marlan Palau M.D.   On: 01/11/2014 13:09   Medications: I have reviewed the patient's current medications. Scheduled Meds: . aspirin EC  325 mg Oral Daily  . carbamazepine  200 mg Oral BID  . cefTRIAXone (ROCEPHIN)  IV  1 g Intravenous Q24H  . digoxin  0.125 mg Oral Daily  . heparin  5,000 Units Subcutaneous Q8H  . insulin aspart  0-9 Units Subcutaneous TID WC  . insulin glargine  12 Units Subcutaneous QHS  . pantoprazole  40 mg Oral Daily  . simvastatin  40 mg Oral q1800   Continuous Infusions: . 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 Patient's symptoms are stable from 1/29, and have progressed since her admission. Neurology would like to obtain TEE instead of 2D echo, so this was ordered and pt will be made NPO at [facility]. Lipid panel shows Total cholesterol 187, TG 194, LDL 105, HDL 43. CIR is appropriate for patient. Will await placement. -ASA 325 -CIR- await bed -TEE, likely Monday (kept her NPO at [facility] tonight just in case this can be done on Saturday) -continue dys 1diet per SLP -telemetry -neuro checks  q4h -holding antihypertensives initially for permissive HTN up to 220/120, though BPs now wnl. Will add back home meds likely tomorrow as BP tolerates.  # Hypokalemia: Resolved after 3 runs of KCl overnight on 1/29. Repeat K 3.9. Mg level 1.6 on admission, which was repleted.   # UTI: UA was dirty on admission and patient w/ increased urinary frequency. It is possible that her increased urinary frequency is due to glucosuria, however, unable to completely rule out symptomatic bacteriuria. Treated UTI x3 days with IV Rocephin - D/c Rocephin 1g IV daily (day 3)  # Chronic systolic CHF: Stable, pt is euvolemic. Echo done on 11/2013 showed EF 30% w/ diffuse hypokinesis. She is on amlodipine 5mg  daily, digoxin, lasix 40mg  BID, hydralazine 100mg  BID, IMDUR 60mg  daily, spironolactone 25mg  daily at home.  -holding antihypertensives for permissive HTN, continue digoxin -I/O  -daily weights   # Hx of CAD: Do not suspect ACS. Patient is on zocor, ASA, metoprolol XL 50mg  BID.  Lipid panel this admission shows total cholesterol 187, TG 194, LDL 105, HDL 43 -continue ASA, statin -holding metoprolol for now   #HTN: BP stable today at 135/83, though slightly elevated overnight to 165/98. Currently holding antihypertensives for permissive HTN. Will need to talk to neurology tomorrow if BP rises to see if we need to continue holding her medications. Of note, she takes  amlodipine, lasix, hydralazine, IMDUR, metoprolol at home.   # Type 2 DM, uncontrolled: A1c 10.7 12/2013. Patient takes lantus 27U qHS and aspart 12-13U TID w/ meals at home. We have been giving lantus 12U qHS w/ SSI, CBGs 155-215 over the past 24hours.  - lantus at 12U qHS  -SSI  -continue tegretol for neuropathy   #OSA: continue CPAP   # VTE: heparin   # Diet: dysphagia 1 diet per SLP  Code status: full  Dispo: Disposition is deferred at this time, awaiting improvement of current medical problems.  Awaiting CIR placement.  The patient does have a current PCP , MD) and does not need an [facility] hospital follow-up appointment after discharge.  The patient does not have transportation limitations that hinder transportation to clinic appointments.  .Services Needed at time of discharge: Y = Yes, Blank = No PT:   OT:   RN:   Equipment:   Other:     LOS: 3 days   , MD 01/12/2014, 12:41 PM

## 2014-01-12 NOTE — Progress Notes (Signed)
Stroke Team Progress Note  HISTORY [name] is a 48 y.o. female, right handed, with a past medical history significant for HTN, DM, hyperlipidemia, morbid obesity, nonischemic cardiomyopathy, chronic diastolic heart failure, CAD, s/p pacemaker placement, OSA on CPAP, DJD, GERD, brought to [facility] ED, 01/09/2014, for further evaluation of dysarthria with left face and arm weakness. She stated that around 10 pm on the evening of 01/08/2014 her speech became slurred and subsequently the left face was droopier than the right face. Then, she started noticing weakness and numbness of the left arm.  Previous admission to [facility] with language impairment but unremarkable neurological work up.  Denies HA, vertigo, double vision, difficulty swallowing, confusion, or visual disturbances.  CT brain on the day of admission revealed no acute abnormality.  She was on aspirin 81 mg daily prior to admission.  Last known well - 10 PM 01/08/2014 No t-PA was given as the patient was out of the window for therapy.   SUBJECTIVE The patient is lethargic. She is slow to respond. She has a very flat affect.  OBJECTIVE Most recent Vital Signs: Filed Vitals:   01/11/14 2126 01/11/14 2130 01/12/14 0155 01/12/14 0555  BP:  161/97 139/71 163/87  Pulse: 93 96 93 87  Temp:  98.1 F (36.7 C) 98.2 F (36.8 C) 98 F (36.7 C)  TempSrc:  Oral Oral Oral  Resp: 20 20 18 18   Height:      Weight:      SpO2: 96% 98% 99% 98%   CBG (last 3)   Recent Labs  01/11/14 1654 01/11/14 2145 01/12/14 0642  GLUCAP 186* 155* 192*    IV Fluid Intake:   . sodium chloride 20 mL/hr at 01/11/14 1743    MEDICATIONS  . aspirin EC  325 mg Oral Daily  . carbamazepine  200 mg Oral BID  . cefTRIAXone (ROCEPHIN)  IV  1 g Intravenous Q24H  . digoxin  0.125 mg Oral Daily  . heparin  5,000 Units Subcutaneous Q8H  . insulin aspart  0-9 Units Subcutaneous TID WC  . insulin glargine  12 Units Subcutaneous QHS  . pantoprazole  40 mg Oral  Daily  . simvastatin  40 mg Oral q1800   PRN:  acetaminophen, albuterol  Diet:  Dysphagia 1 diet with thin liquids Activity:  BRP with assistance DVT Prophylaxis: Subcutaneous heparin  CLINICALLY SIGNIFICANT STUDIES Basic Metabolic Panel:   Recent Labs Lab 01/09/14 1109 01/09/14 1135 01/10/14 0541  NA 134* 136* 135*  K 3.6* 3.5* 3.9  CL 98 100 98  CO2 21  --  25  GLUCOSE 311* 319* 247*  BUN 7 5* 6  CREATININE 0.62 0.60 0.76  CALCIUM 8.5  --  8.8  MG  --   --  1.6   Liver Function Tests:   Recent Labs Lab 01/09/14 1109  AST 15  ALT 15  ALKPHOS 80  BILITOT 0.6  PROT 7.2  ALBUMIN 3.6   CBC:   Recent Labs Lab 01/09/14 1109 01/09/14 1135  WBC 10.4  --   HGB 12.8 13.9  HCT 37.7 41.0  MCV 82.7  --   PLT 213  --    Coagulation:   Recent Labs Lab 01/09/14 1940  LABPROT 13.5  INR 1.05   Cardiac Enzymes:   Recent Labs Lab 01/09/14 1940 01/10/14 0010  TROPONINI <0.30 <0.30   Urinalysis:   Recent Labs Lab 01/09/14 1126  COLORURINE YELLOW  LABSPEC 1.023  PHURINE 6.0  GLUCOSEU >1000*  HGBUR  NEGATIVE  BILIRUBINUR NEGATIVE  KETONESUR NEGATIVE  PROTEINUR NEGATIVE  UROBILINOGEN 0.2  NITRITE POSITIVE*  LEUKOCYTESUR MODERATE*   Lipid Panel    Component Value Date/Time   CHOL 187 01/10/2014 0541   TRIG 194* 01/10/2014 0541   HDL 43 01/10/2014 0541   CHOLHDL 4.3 01/10/2014 0541   VLDL 39 01/10/2014 0541   LDLCALC 105* 01/10/2014 0541   HgbA1C  Lab Results  Component Value Date   HGBA1C 10.7 12/19/2013    Urine Drug Screen:     Component Value Date/Time   LABOPIA NONE DETECTED 01/09/2014 2030   COCAINSCRNUR NONE DETECTED 01/09/2014 2030   LABBENZ NONE DETECTED 01/09/2014 2030   AMPHETMU NONE DETECTED 01/09/2014 2030   THCU NONE DETECTED 01/09/2014 2030   LABBARB NONE DETECTED 01/09/2014 2030    Alcohol Level: No results found for this basename: ETH,  in the last 168 hours  Ct Head Wo Contrast 01/10/2014    1. Expected evolution of right MCA  territory infarct.  2. No new areas of infarct.  Ct Head Wo Contrast 01/10/2014    Subtle blurring of gray-white matter differentiation in the right frontal lobe and right operculum, which may reflect evolving ischemic right MCA territory infarct. No intracranial hemorrhage.     Ct Head Wo Contrast 01/09/2014    1. Asymmetric right-sided white matter hypoattenuation is similar to the prior exam. 2. No acute intracranial abnormality. 3. Minimal sinus disease.    MRI/A of the brain  Pacemaker/defibrillator  CT Angiogram of the Head  01/11/2014 Acute right MCA infarct. Negative for hemorrhage. Occluded branch MCA vessel to this area.  CT Angiogram of the Neck 01/11/2014 Negative CTA of the neck  2D Echocardiogram  12/11/2013 that revealed ejection fraction of 30% with diffuse hypokinesis. No clot identified.  Carotid Doppler  - See CT angiogram of the neck.  CXR  01/01/2014 - Cardiomegaly, unchanged. No acute cardiopulmonary process identified.  EKG   paced rhythm rate 79 beats per minute  Therapy Recommendations CIR  Physical Exam  -  Obese young African American lady currently not in distress.Awake alert. Afebrile. Head is nontraumatic. Neck is supple without bruit. Hearing is normal. Cardiac exam no murmur or gallop. Lungs are clear to auscultation. Distal pulses are well felt. Neurological Exam : Awake alert oriented x3. Dysarthria present. Extraocular movements are full range. Does not blink to threat as much on the left as she does on the right. Pupils equal reactive. Fundi were not visualized. Moderate left lower face and mild left upper face weakness. Tongue deviates to the left. Cough and gag weak. Significant left hemiplegia with 15 left upper extremity and lower extremity strength. Decreased tone on the left. Decreased sensation on the left hemibody. Left plantar is upgoing and right is downgoing. Normal strength on the right side. Gait not tested.  ASSESSMENT [name] is a 48 y.o. female presenting with left upper extremity weakness and dysarthria. TPA was not given as the patient was out of the window for treatment. 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 The patient cannot have an MRI secondary to permanent pacemaker/defibrillator. It was felt that the infarct could possibly be cardioembolic. On aspirin 81 mg orally every day prior to admission. Now on aspirin 325 mg orally every day for secondary stroke prevention. Patient with neuro worsening of her hemiparesis->hemiplegia. Work up underway.   Left ventricular dysfunction with an ejection fraction of 30%  Status post  permanent pacemaker/defibrillator  Previous stroke workup October 2014  Hypertension  Diabetes - hemoglobin A1c 10.7  Hyperlipidemia - LDL 105 - on Zocor prior to admission  Obesity  Coronary artery disease  Obstructive sleep apnea  UTI, >100k GNR, culture pending  CT Angiogram of the Head - acute right MCA infarct. Negative for hemorrhage. Occluded branch MCA vessel to this area.  CT angiogram of the neck - negative   Hospital day # 3  TREATMENT/PLAN  Continue aspirin 325 mg daily  TEE scheduled for Monday  Therapist recommends inpatient rehabilitation - rehabilitation M.D. consult obtained - plan is for CIR   Delton See PA-C [facility] Pager [phone] 01/12/2014, 8:54 AM  I have personally obtained a history, examined the patient, evaluated imaging results, and formulated the assessment and plan of care. I agree with the above.  TEE on Monday, NPO after midnight tomorrow. CIR evaluation. Pauletta Browns

## 2014-01-13 LAB — BASIC METABOLIC PANEL
BUN: 10 mg/dL (ref 6–23)
CO2: 23 meq/L (ref 19–32)
CREATININE: 0.91 mg/dL (ref 0.50–1.10)
Calcium: 9.1 mg/dL (ref 8.4–10.5)
Chloride: 101 mEq/L (ref 96–112)
GFR calc Af Amer: 86 mL/min — ABNORMAL LOW (ref >90)
GFR calc non Af Amer: 74 mL/min — ABNORMAL LOW (ref >90)
GLUCOSE: 183 mg/dL — AB (ref 70–99)
Potassium: 3.8 mEq/L (ref 3.7–5.3)
Sodium: 140 mEq/L (ref 137–147)

## 2014-01-13 LAB — GLUCOSE, CAPILLARY
GLUCOSE-CAPILLARY: 204 mg/dL — AB (ref 70–99)
GLUCOSE-CAPILLARY: 196 mg/dL — AB (ref 70–99)
Glucose-Capillary: 190 mg/dL — ABNORMAL HIGH (ref 70–99)
Glucose-Capillary: 277 mg/dL — ABNORMAL HIGH (ref 70–99)
Glucose-Capillary: 159 mg/dL — ABNORMAL HIGH (ref 70–99)

## 2014-01-13 LAB — MAGNESIUM: Magnesium: 1.9 mg/dL (ref 1.5–2.5)

## 2014-01-13 MED ORDER — WHITE PETROLATUM GEL
Status: AC
  Administered 2014-01-13: 18:00:00
  Filled 2014-01-13: qty 5

## 2014-01-13 NOTE — Progress Notes (Signed)
Stroke Team Progress Note  HISTORY [name] is a 48 y.o. female, right handed, with a past medical history significant for HTN, DM, hyperlipidemia, morbid obesity, nonischemic cardiomyopathy, chronic diastolic heart failure, CAD, s/p pacemaker placement, OSA on CPAP, DJD, GERD, brought to [facility] ED, 01/09/2014, for further evaluation of dysarthria with left face and arm weakness. She stated that around 10 pm on the evening of 01/08/2014 her speech became slurred and subsequently the left face was droopier than the right face. Then, she started noticing weakness and numbness of the left arm.  Previous admission to [facility] with language impairment but unremarkable neurological work up.  Denies HA, vertigo, double vision, difficulty swallowing, confusion, or visual disturbances.  CT brain on the day of admission revealed no acute abnormality.  She was on aspirin 81 mg daily prior to admission.  Last known well - 10 PM 01/08/2014 No t-PA was given as the patient was out of the window for therapy.   SUBJECTIVE Renato Gails and friend at bedside. The patient states that her headache has resolved.  OBJECTIVE Most recent Vital Signs: Filed Vitals:   01/12/14 1833 01/12/14 2122 01/13/14 0103 01/13/14 0504  BP: 144/87 131/88 147/90 137/93  Pulse: 98 86 93 91  Temp: 97.5 F (36.4 C) 98.5 F (36.9 C) 98.6 F (37 C) 98.7 F (37.1 C)  TempSrc: Oral Oral Oral Oral  Resp: 18 18 18 18   Height:      Weight:      SpO2: 100% 97% 97% 97%   CBG (last 3)   Recent Labs  01/12/14 2111 01/13/14 0630 01/13/14 0646  GLUCAP 161* 204* 190*    IV Fluid Intake:   . sodium chloride 20 mL/hr at 01/11/14 1743    MEDICATIONS  . aspirin EC  325 mg Oral Daily  . carbamazepine  200 mg Oral BID  . digoxin  0.125 mg Oral Daily  . heparin  5,000 Units Subcutaneous Q8H  . insulin aspart  0-9 Units Subcutaneous TID WC  . insulin glargine  12 Units Subcutaneous QHS  . pantoprazole  40 mg Oral Daily  . simvastatin   40 mg Oral q1800   PRN:  acetaminophen, albuterol  Diet:  Dysphagia 1 diet with thin liquids Activity:  BRP with assistance DVT Prophylaxis: Subcutaneous heparin  CLINICALLY SIGNIFICANT STUDIES Basic Metabolic Panel:   Recent Labs Lab 01/10/14 0541 01/13/14 0426  NA 135* 140  K 3.9 3.8  CL 98 101  CO2 25 23  GLUCOSE 247* 183*  BUN 6 10  CREATININE 0.76 0.91  CALCIUM 8.8 9.1  MG 1.6 1.9   Liver Function Tests:   Recent Labs Lab 01/09/14 1109  AST 15  ALT 15  ALKPHOS 80  BILITOT 0.6  PROT 7.2  ALBUMIN 3.6   CBC:   Recent Labs Lab 01/09/14 1109 01/09/14 1135  WBC 10.4  --   HGB 12.8 13.9  HCT 37.7 41.0  MCV 82.7  --   PLT 213  --    Coagulation:   Recent Labs Lab 01/09/14 1940  LABPROT 13.5  INR 1.05   Cardiac Enzymes:   Recent Labs Lab 01/09/14 1940 01/10/14 0010  TROPONINI <0.30 <0.30   Urinalysis:   Recent Labs Lab 01/09/14 1126  COLORURINE YELLOW  LABSPEC 1.023  PHURINE 6.0  GLUCOSEU >1000*  HGBUR NEGATIVE  BILIRUBINUR NEGATIVE  KETONESUR NEGATIVE  PROTEINUR NEGATIVE  UROBILINOGEN 0.2  NITRITE POSITIVE*  LEUKOCYTESUR MODERATE*   Lipid Panel    Component Value Date/Time  CHOL 187 01/10/2014 0541   TRIG 194* 01/10/2014 0541   HDL 43 01/10/2014 0541   CHOLHDL 4.3 01/10/2014 0541   VLDL 39 01/10/2014 0541   LDLCALC 105* 01/10/2014 0541   HgbA1C  Lab Results  Component Value Date   HGBA1C 10.7 12/19/2013    Urine Drug Screen:     Component Value Date/Time   LABOPIA NONE DETECTED 01/09/2014 2030   COCAINSCRNUR NONE DETECTED 01/09/2014 2030   LABBENZ NONE DETECTED 01/09/2014 2030   AMPHETMU NONE DETECTED 01/09/2014 2030   THCU NONE DETECTED 01/09/2014 2030   LABBARB NONE DETECTED 01/09/2014 2030    Alcohol Level: No results found for this basename: ETH,  in the last 168 hours  Ct Head Wo Contrast 01/10/2014    1. Expected evolution of right MCA territory infarct.  2. No new areas of infarct.  Ct Head Wo Contrast 01/10/2014     Subtle blurring of gray-white matter differentiation in the right frontal lobe and right operculum, which may reflect evolving ischemic right MCA territory infarct. No intracranial hemorrhage.     Ct Head Wo Contrast 01/09/2014    1. Asymmetric right-sided white matter hypoattenuation is similar to the prior exam. 2. No acute intracranial abnormality. 3. Minimal sinus disease.    MRI/A of the brain  Pacemaker/defibrillator  CT Angiogram of the Head  01/11/2014 Acute right MCA infarct. Negative for hemorrhage. Occluded branch MCA vessel to this area.  CT Angiogram of the Neck 01/11/2014 Negative CTA of the neck  2D Echocardiogram  12/11/2013 that revealed ejection fraction of 30% with diffuse hypokinesis. No clot identified.  Carotid Doppler  - See CT angiogram of the neck.  CXR  01/01/2014 - Cardiomegaly, unchanged. No acute cardiopulmonary process identified.  EKG   paced rhythm rate 79 beats per minute  Therapy Recommendations CIR  Physical Exam  -  Obese young African American lady currently not in distress.Awake alert. Afebrile. Head is nontraumatic. Neck is supple without bruit. Hearing is normal. Cardiac exam no murmur or gallop. Lungs are clear to auscultation. Distal pulses are well felt. Neurological Exam : Awake alert oriented x3. Dysarthria present. Extraocular movements are full range. Does not blink to threat as much on the left as she does on the right. Pupils equal reactive. Fundi were not visualized. Moderate left lower face and mild left upper face weakness. Tongue deviates to the left. Cough and gag weak. Significant left hemiplegia with 15 left upper extremity and lower extremity strength. Decreased tone on the left. Decreased sensation on the left hemibody. Left plantar is upgoing and right is downgoing. Normal strength on the right side. Gait not tested.  ASSESSMENT [name] is a 48 y.o. female presenting with left upper extremity weakness and  dysarthria. TPA was not given as the patient was out of the window for treatment. 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 The patient cannot have an MRI secondary to permanent pacemaker/defibrillator. It was felt that the infarct could possibly be cardioembolic. On aspirin 81 mg orally every day prior to admission. Now on aspirin 325 mg orally every day for secondary stroke prevention. Patient with neuro worsening of her hemiparesis->hemiplegia. Work up underway.   Left ventricular dysfunction with an ejection fraction of 30%  Status post permanent pacemaker/defibrillator  Previous stroke workup October 2014  Hypertension  Diabetes - hemoglobin A1c 10.7  Hyperlipidemia - LDL 105 - on Zocor prior to admission  Obesity  Coronary  artery disease  Obstructive sleep apnea  UTI - treated  CT Angiogram of the Head - acute right MCA infarct. Negative for hemorrhage. Occluded branch MCA vessel to this area.  CT angiogram of the neck - negative   Hospital day # 4  TREATMENT/PLAN  Continue aspirin 325 mg daily  TEE scheduled for Monday  Therapist recommends inpatient rehabilitation - rehabilitation M.D. consult obtained - plan is for CIR   Delton See PA-C [facility] Pager [phone] 01/13/2014, 8:58 AM  I have personally obtained a history, examined the patient, evaluated imaging results, and formulated the assessment and plan of care. I agree with the above.

## 2014-01-13 NOTE — Progress Notes (Signed)
Subjective: [name] was seen and examined by me this morning.  Two visitors had just arrived to see her.  She was sitting up with breakfast tray in front of her.  She has no new symptoms and denies trouble swallowing, cough, CP, SOB, dysuria or diarrhea.  She says she is still unable to move her left side.   Objective: Vital signs in last 24 hours: Filed Vitals:   01/12/14 1833 01/12/14 2122 01/13/14 0103 01/13/14 0504  BP: 144/87 131/88 147/90 137/93  Pulse: 98 86 93 91  Temp: 97.5 F (36.4 C) 98.5 F (36.9 C) 98.6 F (37 C) 98.7 F (37.1 C)  TempSrc: Oral Oral Oral Oral  Resp: 18 18 18 18   Height:      Weight:      SpO2: 100% 97% 97% 97%   Weight change:  No intake or output data in the 24 hours ending 01/13/14 1023 General: sitting up in chair with breakfast tray in front of her.   HEENT: EOMI, + left facial droop, tongue deviates to the left Cardiac: RRR, no rubs, murmurs or gallops Pulm: clear to auscultation bilaterally, moving normal volumes of air Abd: soft, nontender, nondistended, BS present Ext: warm and well perfused, no pedal edema Neuro: alert and oriented X3, unable to check pupil reflex since light source in room stopped working, she was able to bring spoon to mouth (with right hand) on command.  Motor strength is 0/5 on left upper and lower extremity.  Full strength on RUE and RLE. Decreased sensation on left face, left upper and lower extremities.  Tongue deviates to the left.  Lab Results: Basic Metabolic Panel:  Recent Labs Lab 01/10/14 0541 01/13/14 0426  NA 135* 140  K 3.9 3.8  CL 98 101  CO2 25 23  GLUCOSE 247* 183*  BUN 6 10  CREATININE 0.76 0.91  CALCIUM 8.8 9.1  MG 1.6 1.9   Liver Function Tests:  Recent Labs Lab 01/09/14 1109  AST 15  ALT 15  ALKPHOS 80  BILITOT 0.6  PROT 7.2  ALBUMIN 3.6   CBC:  Recent Labs Lab 01/09/14 1109 01/09/14 1135  WBC 10.4  --   HGB 12.8 13.9  HCT 37.7 41.0  MCV 82.7  --   PLT 213  --     Cardiac Enzymes:  Recent Labs Lab 01/09/14 1940 01/10/14 0010  TROPONINI <0.30 <0.30   CBG:  Recent Labs Lab 01/12/14 0642 01/12/14 1208 01/12/14 1700 01/12/14 2111 01/13/14 0630 01/13/14 0646  GLUCAP 192* 183* 177* 161* 204* 190*   Coagulation:  Recent Labs Lab 01/09/14 1940  LABPROT 13.5  INR 1.05   Urine Drug Screen: Drugs of Abuse     Component Value Date/Time   LABOPIA NONE DETECTED 01/09/2014 2030   COCAINSCRNUR NONE DETECTED 01/09/2014 2030   LABBENZ NONE DETECTED 01/09/2014 2030   AMPHETMU NONE DETECTED 01/09/2014 2030   THCU NONE DETECTED 01/09/2014 2030   LABBARB NONE DETECTED 01/09/2014 2030    Urinalysis:  Recent Labs Lab 01/09/14 1126  COLORURINE YELLOW  LABSPEC 1.023  PHURINE 6.0  GLUCOSEU >1000*  HGBUR NEGATIVE  BILIRUBINUR NEGATIVE  KETONESUR NEGATIVE  PROTEINUR NEGATIVE  UROBILINOGEN 0.2  NITRITE POSITIVE*  LEUKOCYTESUR MODERATE*    Micro Results: Recent Results (from the past 240 hour(s))  URINE CULTURE     Status: None   Collection Time    01/09/14 11:26 AM      Result Value Range Status   Specimen Description URINE, RANDOM  Final   Special Requests NONE   Final   Culture  Setup Time     Final   Value: 01/09/2014 20:31     Performed at [facility] Count     Final   Value: >=100,000 COLONIES/ML     Performed at [facility]   Culture     Final   Value: ESCHERICHIA COLI     Performed at [facility]   Report Status 01/11/2014 FINAL   Final   Organism ID, Bacteria ESCHERICHIA COLI   Final    Studies/Results: Ct Angio Head W/cm &/or Wo Cm  01/11/2014   CLINICAL DATA:  Stroke  EXAM: CT ANGIOGRAPHY HEAD AND NECK  TECHNIQUE: Multidetector CT imaging of the head and neck was performed using the standard protocol during bolus administration of intravenous contrast. Multiplanar CT image reconstructions including MIPs were obtained to evaluate the vascular anatomy. Carotid stenosis measurements  (when applicable) are obtained utilizing NASCET criteria, using the distal internal carotid diameter as the denominator.  CONTRAST:  OMNIPAQUE IOHEXOL 350 MG/ML SOLN  COMPARISON:  CT head 01/10/2014  FINDINGS: CTA HEAD FINDINGS  Right MCA infarct shows progressive low-density edema but similar in distribution to yesterday. Infarct involves the insula and operculum. No associated hemorrhage. No other areas of acute infarct. Ventricle size is normal. No shift of the midline structures.  Both vertebral arteries are patent to the basilar. Posterior inferior cerebellar artery patent bilaterally. Basilar artery is widely patent. Superior cerebellar and posterior cerebral arteries are patent bilaterally.  Internal carotid artery is patent bilaterally without significant stenosis. Anterior cerebral arteries are patent bilaterally without stenosis. Left middle cerebral artery widely patent.  Right M1 segment widely patent. Right MCA bifurcation widely patent. There is an occluded branch to the area of acute infarct.  Negative for cerebral aneurysm.  Review of the MIP images confirms the above findings.  CTA NECK FINDINGS  Negative for mass or adenopathy in the neck. No acute bony change. Lung apices are clear.  Pacemaker leads are noted. Aortic arch is normal. Proximal great vessels are widely patent.  The carotid artery is widely patent bilaterally. Negative for atherosclerotic disease or dissection. No significant carotid stenosis.  Both vertebral arteries are widely patent without significant stenosis.  Review of the MIP images confirms the above findings.  IMPRESSION: Acute right MCA infarct. Negative for hemorrhage. Occluded branch MCA vessel to this area.  Negative CTA of the neck.   Electronically Signed   By: Marlan Palau M.D.   On: 01/11/2014 13:09   Ct Angio Neck W/cm &/or Wo/cm  01/11/2014   CLINICAL DATA:  Stroke  EXAM: CT ANGIOGRAPHY HEAD AND NECK  TECHNIQUE: Multidetector CT imaging of the head and  neck was performed using the standard protocol during bolus administration of intravenous contrast. Multiplanar CT image reconstructions including MIPs were obtained to evaluate the vascular anatomy. Carotid stenosis measurements (when applicable) are obtained utilizing NASCET criteria, using the distal internal carotid diameter as the denominator.  CONTRAST:  OMNIPAQUE IOHEXOL 350 MG/ML SOLN  COMPARISON:  CT head 01/10/2014  FINDINGS: CTA HEAD FINDINGS  Right MCA infarct shows progressive low-density edema but similar in distribution to yesterday. Infarct involves the insula and operculum. No associated hemorrhage. No other areas of acute infarct. Ventricle size is normal. No shift of the midline structures.  Both vertebral arteries are patent to the basilar. Posterior inferior cerebellar artery patent bilaterally. Basilar artery is widely patent. Superior cerebellar and  posterior cerebral arteries are patent bilaterally.  Internal carotid artery is patent bilaterally without significant stenosis. Anterior cerebral arteries are patent bilaterally without stenosis. Left middle cerebral artery widely patent.  Right M1 segment widely patent. Right MCA bifurcation widely patent. There is an occluded branch to the area of acute infarct.  Negative for cerebral aneurysm.  Review of the MIP images confirms the above findings.  CTA NECK FINDINGS  Negative for mass or adenopathy in the neck. No acute bony change. Lung apices are clear.  Pacemaker leads are noted. Aortic arch is normal. Proximal great vessels are widely patent.  The carotid artery is widely patent bilaterally. Negative for atherosclerotic disease or dissection. No significant carotid stenosis.  Both vertebral arteries are widely patent without significant stenosis.  Review of the MIP images confirms the above findings.  IMPRESSION: Acute right MCA infarct. Negative for hemorrhage. Occluded branch MCA vessel to this area.  Negative CTA of the neck.    Electronically Signed   By: Marlan Palau M.D.   On: 01/11/2014 13:09   Medications: I have reviewed the patient's current medications. Scheduled Meds: . aspirin EC  325 mg Oral Daily  . carbamazepine  200 mg Oral BID  . digoxin  0.125 mg Oral Daily  . heparin  5,000 Units Subcutaneous Q8H  . insulin aspart  0-9 Units Subcutaneous TID WC  . insulin glargine  12 Units Subcutaneous QHS  . pantoprazole  40 mg Oral Daily  . simvastatin  40 mg Oral q1800   Continuous Infusions: . sodium chloride 20 mL/hr at 01/11/14 1743   PRN Meds:.acetaminophen, albuterol  Assessment/Plan: 48 year old woman with PMH of HTN, DM type 2, systolic HF with R MCA CVA.  # CVA in R MCA territory:  Her deficits progressed from hemiparesis to hemiplegia. CTA head and neck:  R MCA infarct with occluded MCA branch leading to this area, neck arteries patent; no hemorrhage.  Neurology recommends TEE. - continue ASA 325 - TEE tomorrow, NPO after midnight - continue dysphagia 1diet per SLP - telemetry - neuro checks q4h - holding antihypertensives initially for permissive HTN up to 220/120, though BPs now normal.  Will continue to hold in the setting of normotension (BP 127/88 this AM).  # Hypokalemia, resolved: K 3.8 today.  # UTI: Increased frequency and + UA on admission.  Frequency may have been due to glucosuria (> 1000).  She received 3 days of IV Rocephin.  # Chronic systolic CHF: Stable.  Echo done on 11/2013 showed EF 30% w/ diffuse hypokinesis. She is on amlodipine 5mg  daily, digoxin, lasix 40mg  BID, hydralazine 100mg  BID, IMDUR 60mg  daily, spironolactone 25mg  daily at home.  -holding antihypertensives initially for permissive HTN and now normotension -I/O  -daily weights   # Hx of CAD: She denies CP.  Continue ASA and Zocor.  Add back metoprolol once BP can tolerate. -continue ASA, statin -holding metoprolol for now   #HTN: BPs 140s/90s overnight.  127/88 this AM. Currently holding  antihypertensives for permissive HTN.  Of note, she takes amlodipine, lasix, hydralazine, IMDUR, metoprolol at home.  - can begin adding back BP medications slowly as BP tolerates  # Type 2 DM, uncontrolled: A1c 10.7 (January 2015). Patient takes Lantus 27U qHS and aspart 12-13U TID w/ meals at home. We have been giving lantus 12U qHS w/ SSI.  CBG 161-277 in past 24 hours. - Lantus at 12U qHS  -SSI  -continue tegretol for neuropathy   #OSA: continue CPAP   #  VTE: heparin   # Diet: dysphagia 1 diet per SLP  Code status: full  Dispo: Disposition is deferred at this time, awaiting improvement of current medical problems.  Awaiting CIR placement.  The patient does have a current PCP Courtney Paris, MD) and does not need an [facility] hospital follow-up appointment after discharge.  The patient does not have transportation limitations that hinder transportation to clinic appointments.  .Services Needed at time of discharge: Y = Yes, Blank = No PT:   OT:   RN:   Equipment:   Other:     LOS: 4 days   Evelena Peat, DO 01/13/2014, 10:23 AM

## 2014-01-14 ENCOUNTER — Encounter (HOSPITAL_COMMUNITY)
Admission: EM | Disposition: A | Discharge status: Rehabilitation Facility | Payer: Self-pay | Source: Home / Self Care | Attending: Internal Medicine

## 2014-01-14 ENCOUNTER — Encounter (HOSPITAL_COMMUNITY): Payer: Self-pay

## 2014-01-14 DIAGNOSIS — I6350 Cerebral infarction due to unspecified occlusion or stenosis of unspecified cerebral artery: Principal | ICD-10-CM

## 2014-01-14 HISTORY — PX: TEE WITHOUT CARDIOVERSION: SHX5443

## 2014-01-14 LAB — GLUCOSE, CAPILLARY
GLUCOSE-CAPILLARY: 176 mg/dL — AB (ref 70–99)
Glucose-Capillary: 203 mg/dL — ABNORMAL HIGH (ref 70–99)
Glucose-Capillary: 175 mg/dL — ABNORMAL HIGH (ref 70–99)
Glucose-Capillary: 241 mg/dL — ABNORMAL HIGH (ref 70–99)

## 2014-01-14 SURGERY — ECHOCARDIOGRAM, TRANSESOPHAGEAL
Anesthesia: Moderate Sedation

## 2014-01-14 MED ORDER — MIDAZOLAM HCL 5 MG/ML IJ SOLN
INTRAMUSCULAR | Status: AC
  Filled 2014-01-14: qty 2

## 2014-01-14 MED ORDER — METOPROLOL SUCCINATE ER 25 MG PO TB24
25.0000 mg | ORAL_TABLET | Freq: Two times a day (BID) | ORAL | Status: DC
  Administered 2014-01-14 – 2014-01-15 (×3): 25 mg via ORAL
  Filled 2014-01-14 (×4): qty 1

## 2014-01-14 MED ORDER — FENTANYL CITRATE 0.05 MG/ML IJ SOLN
INTRAMUSCULAR | Status: DC | PRN
  Administered 2014-01-14: 25 ug via INTRAVENOUS

## 2014-01-14 MED ORDER — MIDAZOLAM HCL 10 MG/2ML IJ SOLN
INTRAMUSCULAR | Status: DC | PRN
  Administered 2014-01-14: 2 mg via INTRAVENOUS

## 2014-01-14 MED ORDER — DIPHENHYDRAMINE HCL 50 MG/ML IJ SOLN
INTRAMUSCULAR | Status: AC
  Filled 2014-01-14: qty 1

## 2014-01-14 MED ORDER — SODIUM CHLORIDE 0.9 % IV SOLN
Freq: Once | INTRAVENOUS | Status: AC
  Administered 2014-01-14: 250 mL via INTRAVENOUS

## 2014-01-14 MED ORDER — BUTAMBEN-TETRACAINE-BENZOCAINE 2-2-14 % EX AERO
INHALATION_SPRAY | CUTANEOUS | Status: DC | PRN
  Administered 2014-01-14: 2 via TOPICAL

## 2014-01-14 MED ORDER — INSULIN GLARGINE 100 UNIT/ML ~~LOC~~ SOLN
14.0000 [IU] | Freq: Every day | SUBCUTANEOUS | Status: DC
  Administered 2014-01-14: 14 [IU] via SUBCUTANEOUS
  Filled 2014-01-14 (×2): qty 0.14

## 2014-01-14 MED ORDER — FENTANYL CITRATE 0.05 MG/ML IJ SOLN
INTRAMUSCULAR | Status: AC
  Filled 2014-01-14: qty 2

## 2014-01-14 NOTE — Progress Notes (Signed)
Stroke Team Progress Note  HISTORY [name] is a 48 y.o. female, right handed, with a past medical history significant for HTN, DM, hyperlipidemia, morbid obesity, nonischemic cardiomyopathy, chronic diastolic heart failure, CAD, s/p pacemaker placement, OSA on CPAP, DJD, GERD, brought to [facility] ED, 01/09/2014, for further evaluation of dysarthria with left face and arm weakness. She stated that around 10 pm on the evening of 01/08/2014 her speech became slurred and subsequently the left face was droopier than the right face. Then, she started noticing weakness and numbness of the left arm. Previous admission to [facility] with language impairment but unremarkable neurological work up. Denies HA, vertigo, double vision, difficulty swallowing, confusion, or visual disturbances. CT brain on the day of admission revealed no acute abnormality. She was on aspirin 81 mg daily prior to admission. No t-PA was given as the patient was out of the window for therapy.  SUBJECTIVE Family at bedside.  OBJECTIVE Most recent Vital Signs: Filed Vitals:   01/14/14 0211 01/14/14 0532 01/14/14 1032 01/14/14 1055  BP: 124/87 167/94 145/76   Pulse: 95 90 91 91  Temp: 97.6 F (36.4 C) 98.1 F (36.7 C) 97.6 F (36.4 C)   TempSrc: Oral Oral Oral   Resp: 20 18 18    Height:      Weight:  121.7 kg (268 lb 4.8 oz)    SpO2: 97% 96% 100%    CBG (last 3)   Recent Labs  01/13/14 1651 01/13/14 2101 01/14/14 0708  GLUCAP 159* 196* 176*    IV Fluid Intake:   . sodium chloride 1,000 mL (01/14/14 0846)    MEDICATIONS  . aspirin EC  325 mg Oral Daily  . carbamazepine  200 mg Oral BID  . digoxin  0.125 mg Oral Daily  . heparin  5,000 Units Subcutaneous Q8H  . insulin aspart  0-9 Units Subcutaneous TID WC  . insulin glargine  12 Units Subcutaneous QHS  . pantoprazole  40 mg Oral Daily  . simvastatin  40 mg Oral q1800   PRN:  acetaminophen, albuterol  Diet:  NPO 1 diet with thin liquids Activity:  BRP with  assistance DVT Prophylaxis: Subcutaneous heparin  CLINICALLY SIGNIFICANT STUDIES Basic Metabolic Panel:   Recent Labs Lab 01/10/14 0541 01/13/14 0426  NA 135* 140  K 3.9 3.8  CL 98 101  CO2 25 23  GLUCOSE 247* 183*  BUN 6 10  CREATININE 0.76 0.91  CALCIUM 8.8 9.1  MG 1.6 1.9   Liver Function Tests:   Recent Labs Lab 01/09/14 1109  AST 15  ALT 15  ALKPHOS 80  BILITOT 0.6  PROT 7.2  ALBUMIN 3.6   CBC:   Recent Labs Lab 01/09/14 1109 01/09/14 1135  WBC 10.4  --   HGB 12.8 13.9  HCT 37.7 41.0  MCV 82.7  --   PLT 213  --    Coagulation:   Recent Labs Lab 01/09/14 1940  LABPROT 13.5  INR 1.05   Cardiac Enzymes:   Recent Labs Lab 01/09/14 1940 01/10/14 0010  TROPONINI <0.30 <0.30   Urinalysis:   Recent Labs Lab 01/09/14 1126  COLORURINE YELLOW  LABSPEC 1.023  PHURINE 6.0  GLUCOSEU >1000*  HGBUR NEGATIVE  BILIRUBINUR NEGATIVE  KETONESUR NEGATIVE  PROTEINUR NEGATIVE  UROBILINOGEN 0.2  NITRITE POSITIVE*  LEUKOCYTESUR MODERATE*   Lipid Panel    Component Value Date/Time   CHOL 187 01/10/2014 0541   TRIG 194* 01/10/2014 0541   HDL 43 01/10/2014 0541   CHOLHDL 4.3  01/10/2014 0541   VLDL 39 01/10/2014 0541   LDLCALC 105* 01/10/2014 0541   HgbA1C  Lab Results  Component Value Date   HGBA1C 10.7 12/19/2013    Urine Drug Screen:     Component Value Date/Time   LABOPIA NONE DETECTED 01/09/2014 2030   COCAINSCRNUR NONE DETECTED 01/09/2014 2030   LABBENZ NONE DETECTED 01/09/2014 2030   AMPHETMU NONE DETECTED 01/09/2014 2030   THCU NONE DETECTED 01/09/2014 2030   LABBARB NONE DETECTED 01/09/2014 2030    Alcohol Level: No results found for this basename: ETH,  in the last 168 hours  Ct Head Wo Contrast 01/10/2014    1. Expected evolution of right MCA territory infarct.  2. No new areas of infarct.  Ct Head Wo Contrast 01/10/2014    Subtle blurring of gray-white matter differentiation in the right frontal lobe and right operculum, which may  reflect evolving ischemic right MCA territory infarct. No intracranial hemorrhage.     Ct Head Wo Contrast 01/09/2014    1. Asymmetric right-sided white matter hypoattenuation is similar to the prior exam. 2. No acute intracranial abnormality. 3. Minimal sinus disease.    MRI/A of the brain  Pacemaker/defibrillator  CT Angiogram of the Head  01/11/2014 Acute right MCA infarct. Negative for hemorrhage. Occluded branch MCA vessel to this area.  CT Angiogram of the Neck 01/11/2014 Negative CTA of the neck  2D Echocardiogram  12/11/2013 that revealed ejection fraction of 30% with diffuse hypokinesis. No clot identified.  Carotid Doppler  - See CT angiogram of the neck.  CXR  01/01/2014 - Cardiomegaly, unchanged. No acute cardiopulmonary process identified.  EKG   paced rhythm rate 79 beats per minute  Therapy Recommendations CIR  Physical Exam  -  Obese young African American lady currently not in distress.Awake alert. Afebrile. Head is nontraumatic. Neck is supple without bruit. Hearing is normal. Cardiac exam no murmur or gallop. Lungs are clear to auscultation. Distal pulses are well felt. Neurological Exam : Awake alert oriented x3. Dysarthria present. Extraocular movements are full range. Does not blink to threat as much on the left as she does on the right. Pupils equal reactive. Fundi were not visualized. Moderate left lower face and mild left upper face weakness. Tongue deviates to the left. Cough and gag weak. Significant left hemiplegia with 15 left upper extremity and lower extremity strength. Decreased tone on the left. Decreased sensation on the left hemibody. Left plantar is upgoing and right is downgoing. Normal strength on the right side. Gait not tested.  ASSESSMENT [name] is a 48 y.o. female presenting with left upper extremity weakness and dysarthria. TPA was not given as the patient was out of the window for treatment. A CT scan was consistent with a  possible large right middle cerebral artery territory cortical infarct. The patient cannot have an MRI secondary to permanent pacemaker/defibrillator. It was felt that the infarct could possibly be cardioembolic. On aspirin 81 mg orally every day prior to admission. Now on aspirin 325 mg orally every day for secondary stroke prevention. Patient with neuro worsening of her hemiparesis->hemiplegia. Work up underway.   Left ventricular dysfunction with an ejection fraction of 30%  Status post permanent pacemaker/defibrillator  Previous stroke workup October 2014  Hypertension  Diabetes - hemoglobin A1c 10.7  Hyperlipidemia - LDL 105 - on Zocor prior to admission  Obesity  Coronary artery disease  Obstructive sleep apnea, refusing to use CPAP in hospital  UTI - treated  CT Angiogram of  the Head - acute right MCA infarct. Negative for hemorrhage. Occluded branch MCA vessel to this area.  CT angiogram of the neck - negative   Hospital day # 5  TREATMENT/PLAN  Continue aspirin 325 mg daily  TEE to look for embolic source. Arranged for today. If positive for PFO (patent foramen ovale), check bilateral lower extremity venous dopplers to rule out DVT as possible source of stroke.   Therapist recommends inpatient rehabilitation - rehabilitation is reviewing for appropriate level/location for rehab  at time of dsicharge  Annie Main, MSN, RN, ANVP-BC, ANP-BC, [facility] Pager: [phone] 01/14/2014 12:43 PM  I have personally obtained a history, examined the patient, evaluated imaging results, and formulated the assessment and plan of care. I agree with the above.  Delia Heady, MD

## 2014-01-14 NOTE — Progress Notes (Signed)
UR complete.  Ura Hausen RN, MSN 

## 2014-01-14 NOTE — Progress Notes (Signed)
Rehab admissions - Evaluated for possible admission.  Spoke with brother this am at the bedside.  Patient lives alone and has 2 1/2 hours of service care daily.  Brother suggested that we call the daughter for decision making.  Will call daughter to discuss rehab options.  Call me for followup.  #[phone]

## 2014-01-14 NOTE — Progress Notes (Signed)
Patient refused CPAP X2 tonight.  Patient encouraged to Call Respiratory if she would like to be placed on CPAP.

## 2014-01-14 NOTE — CV Procedure (Addendum)
INDICATIONS: cryptogenic stroke  PROCEDURE:   Informed consent was obtained prior to the procedure. The risks, benefits and alternatives for the procedure were discussed and the patient comprehended these risks.  Risks include, but are not limited to, cough, sore throat, vomiting, nausea, somnolence, esophageal and stomach trauma or perforation, bleeding, low blood pressure, aspiration, pneumonia, infection, trauma to the teeth and death.    After a procedural time-out, the oropharynx was anesthetized with 20% benzocaine spray. The patient was given 2 mg versed and 25 mcg fentanyl for moderate sedation.   The transesophageal probe was inserted in the esophagus and stomach without difficulty and multiple views were obtained.  The patient was kept under observation until the patient left the procedure room.  The patient left the procedure room in stable condition.   Agitated microbubble saline contrast was administered.  COMPLICATIONS:    There were no immediate complications.  FINDINGS:  Moderately depressed LVEF No LA thrombus, normal appendage emptying velocity. No vegetations. Normal aorta. Intact atrial septum. No shunt by Doppler or saline contrast study.  Incidental note of hyperechoic masses on the pacemaker leads in the SVC and right atrium. These are likely thrombotic, less likely infectious  RECOMMENDATIONS:   If there is suspicion of systemic infection/endocarditis, check blood cultures Interrogate pacemaker for evidence of atrial arrhythmia (if not already done).  Time Spent Directly with the Patient:  30 minutes   Jourdyn Hasler 01/14/2014, 3:57 PM

## 2014-01-14 NOTE — Progress Notes (Signed)
Inpatient Diabetes Program Recommendations  AACE/ADA: New Consensus Statement on Inpatient Glycemic Control (2013)  Target Ranges:  Prepandial:   less than 140 mg/dL      Peak postprandial:   less than 180 mg/dL (1-2 hours)      Critically ill patients:  140 - 180 mg/dL   Outpatient Diabetes medications: Lantus 27 units, 12-13 units meal coverage Current orders for Inpatient glycemic control: 12 units Lantus and sens correct tidwc  Inpatient Diabetes Program Recommendations Insulin - Basal: Increase lantus to 20 units.  Pt takes 27 units at home Correction (SSI): consider increasing Novolog to moderate scale  Insulin - Meal Coverage: Pt takes 12-13 units meal coverage at home.  Please add 3 units meal coverage while here. Thank you, Lenor Coffin, RN, CNS, Diabetes Coordinator [phone])

## 2014-01-14 NOTE — Interval H&P Note (Signed)
History and Physical Interval Note:  01/14/2014 3:05 PM  [name]  has presented today for surgery, with the diagnosis of STROKE  The various methods of treatment have been discussed with the patient and family. After consideration of risks, benefits and other options for treatment, the patient has consented to  Procedure(s): TRANSESOPHAGEAL ECHOCARDIOGRAM (TEE) (N/A) as a surgical intervention .  The patient's history has been reviewed, patient examined, no change in status, stable for surgery.  I have reviewed the patient's chart and labs.  Questions were answered to the patient's satisfaction.     [name]

## 2014-01-14 NOTE — Progress Notes (Signed)
Subjective: [name] was seen and examined this morning.  She has no new symptoms and denies trouble swallowing, cough, CP, SOB, dysuria or diarrhea.  She states that she has a spasm in her left hand when she yawns but has no movement in the hand otherwise. She denies being able to move her LLE.   Objective: Vital signs in last 24 hours: Filed Vitals:   01/14/14 0211 01/14/14 0532 01/14/14 1032 01/14/14 1055  BP: 124/87 167/94 145/76   Pulse: 95 90 91 91  Temp: 97.6 F (36.4 C) 98.1 F (36.7 C) 97.6 F (36.4 C)   TempSrc: Oral Oral Oral   Resp: 20 18 18    Height:      Weight:  268 lb 4.8 oz (121.7 kg)    SpO2: 97% 96% 100%    Weight change:  No intake or output data in the 24 hours ending 01/14/14 1403 General: Lying in bed, NAD   HEENT: EOMI, + left facial droop, tongue deviates to the left Cardiac: RRR, no rubs, murmurs or gallops Pulm: clear to auscultation bilaterally, moving normal volumes of air Abd: soft, nontender, nondistended, BS present Ext: warm and well perfused, no pedal edema Neuro: alert and oriented X3. Motor strength is 0/5 on left upper and lower extremity.  Full strength on RUE and RLE. Decreased sensation on left face, left upper and lower extremities.  Tongue deviates to the left.  Lab Results: Basic Metabolic Panel:  Recent Labs Lab 01/10/14 0541 01/13/14 0426  NA 135* 140  K 3.9 3.8  CL 98 101  CO2 25 23  GLUCOSE 247* 183*  BUN 6 10  CREATININE 0.76 0.91  CALCIUM 8.8 9.1  MG 1.6 1.9   Liver Function Tests:  Recent Labs Lab 01/09/14 1109  AST 15  ALT 15  ALKPHOS 80  BILITOT 0.6  PROT 7.2  ALBUMIN 3.6   CBC:  Recent Labs Lab 01/09/14 1109 01/09/14 1135  WBC 10.4  --   HGB 12.8 13.9  HCT 37.7 41.0  MCV 82.7  --   PLT 213  --    Cardiac Enzymes:  Recent Labs Lab 01/09/14 1940 01/10/14 0010  TROPONINI <0.30 <0.30   CBG:  Recent Labs Lab 01/13/14 0630 01/13/14 0646 01/13/14 1151 01/13/14 1651 01/13/14 2101  01/14/14 0708  GLUCAP 204* 190* 277* 159* 196* 176*   Coagulation:  Recent Labs Lab 01/09/14 1940  LABPROT 13.5  INR 1.05   Urine Drug Screen: Drugs of Abuse     Component Value Date/Time   LABOPIA NONE DETECTED 01/09/2014 2030   COCAINSCRNUR NONE DETECTED 01/09/2014 2030   LABBENZ NONE DETECTED 01/09/2014 2030   AMPHETMU NONE DETECTED 01/09/2014 2030   THCU NONE DETECTED 01/09/2014 2030   LABBARB NONE DETECTED 01/09/2014 2030    Urinalysis:  Recent Labs Lab 01/09/14 1126  COLORURINE YELLOW  LABSPEC 1.023  PHURINE 6.0  GLUCOSEU >1000*  HGBUR NEGATIVE  BILIRUBINUR NEGATIVE  KETONESUR NEGATIVE  PROTEINUR NEGATIVE  UROBILINOGEN 0.2  NITRITE POSITIVE*  LEUKOCYTESUR MODERATE*    Micro Results: Recent Results (from the past 240 hour(s))  URINE CULTURE     Status: None   Collection Time    01/09/14 11:26 AM      Result Value Range Status   Specimen Description URINE, RANDOM   Final   Special Requests NONE   Final   Culture  Setup Time     Final   Value: 01/09/2014 20:31     Performed at 01/11/2014  [facility]   Colony Count     Final   Value: >=100,000 COLONIES/ML     Performed at [employer]     Final   Value: ESCHERICHIA COLI     Performed at [employer]   Report Status 01/11/2014 FINAL   Final   Organism ID, Bacteria ESCHERICHIA COLI   Final    Studies/Results: No results found. Medications: I have reviewed the patient's current medications. Scheduled Meds: . aspirin EC  325 mg Oral Daily  . carbamazepine  200 mg Oral BID  . digoxin  0.125 mg Oral Daily  . heparin  5,000 Units Subcutaneous Q8H  . insulin aspart  0-9 Units Subcutaneous TID WC  . insulin glargine  12 Units Subcutaneous QHS  . pantoprazole  40 mg Oral Daily  . simvastatin  40 mg Oral q1800   Continuous Infusions: . sodium chloride 1,000 mL (01/14/14 0846)   PRN Meds:.acetaminophen, albuterol  Assessment/Plan: 48 year old woman with PMH of HTN, DM type 2,  systolic HF with R MCA CVA.  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 CTA head and neck revealed R MCA infarct with occluded MCA branch leading to this area, neck arteries patent; no hemorrhage.  Neurology recommending TEE which is to happen today, pt NPO; will restart diet after test. - continue ASA 325 - TEE today, NPO after midnight - NPO, restart dysphagia 1diet per SLP after ECHO - Continue Telemetry - Neuro checks q4h - Slowing adding back antihypertensives beginning with Toprol-XL 25mg  BID (will up-titrate as BP tolerates)  #HTN: BPs variable over the past 24 hours. 145/76 this AM. Currently holding antihypertensives for permissive HTN-  amlodipine, lasix, hydralazine, IMDUR, metoprolol at home. Will slowly add back her antihypertensives. - Restarting Toprol-XL but at half the dose at 25mg  BID.  # Type 2 DM, uncontrolled: A1c 10.7 (January 2015). Patient takes Lantus 27U qHS and aspart 12-13U TID w/ meals at home. We have been giving lantus 12U qHS w/ SSI.  CBG remaining elevated. Increasing Lantus to 14u - Lantus at 14U qHS  -SSI  -continue tegretol for neuropathy   # Chronic systolic CHF: Stable.  2D ECHO on 11/2013 showed EF 30% w/ diffuse hypokinesis. She is on amlodipine 5mg  daily, digoxin, lasix 40mg  BID, hydralazine 100mg  BID, IMDUR 60mg  daily, spironolactone 25mg  daily at home.  - Slowly restarting medications as BP permits, starting Toprol-Xl 25mg  BID today - I/O  - daily weights   # Hx of CAD: She denies CP.  Continue ASA and Zocor.  Add back metoprolol once BP can tolerate. -continue ASA, statin -holding metoprolol for now  # Hypokalemia: Resolved: K 3.8 on 01/13/14.  # UTI: Increased frequency and + UA on admission.  Frequency may have been due to glucosuria (> 1000).  She received 3 days of IV Rocephin for uncomplicated UTI.  #OSA: Continue CPAP   # VTE: heparin   # Diet: Dysphagia 1 diet per  SLP  Code status: full  Dispo: Disposition is deferred at this time, awaiting improvement of current medical problems.  Awaiting CIR placement.  The patient does have a current PCP Courtney Paris, MD) and does not need an [facility] hospital follow-up appointment after discharge.  The patient does not have transportation limitations that hinder transportation to clinic appointments.  .Services Needed at time of discharge: Y = Yes, Blank = No PT:   OT:  RN:   Equipment:   Other:     LOS: 5 days   Genelle Gather, MD 01/14/2014, 2:03 PM

## 2014-01-14 NOTE — Progress Notes (Signed)
  Echocardiogram Echocardiogram Transesophageal has been performed.  [name] FRANCES 01/14/2014, 4:27 PM

## 2014-01-14 NOTE — Progress Notes (Signed)
Patient placed on CPAP with nasal mask on auto titrate settings. Pt vitals within normal limits and patient tolerating CPAP.  RT will continue to monitor.

## 2014-01-14 NOTE — Progress Notes (Signed)
Internal Medicine Attending  Date: 01/14/2014  Patient name: [name] Medical record number: [id number] Date of birth: 1966/09/20 Age: 48 y.o. Gender: female  I saw and evaluated the patient. I reviewed the resident's note by Dr. Sherrine Maples and I agree with the resident's findings and plans as documented in her progress note.  [name] had a right-sided cerebrovascular accident secondary to occlusion of a branch of the MCA. On exam she has a dense hemiparesis of the left upper and lower extremity. She is undergoing a transesophageal echocardiogram today to look for a cardiac source for this occlusion. We continue with permissive hypertension and management of her other chronic medical problems including her diabetes and obstructive sleep apnea. Further therapy is pending the results of the TEE. Ultimately she will require some intensive rehabilitation at a skilled nursing facility or our inpatient rehabilitation unit. She is being assessed for this eventuality.

## 2014-01-14 NOTE — Progress Notes (Signed)
Physical Therapy Treatment Patient Details Name: [name] MRN: [id number] DOB: 03/31/1966 Today's Date: 01/14/2014 Time: 4010-2725 PT Time Calculation (min): 16 min  PT Assessment / Plan / Recommendation  History of Present Illness Pt presents with L sided weakness and dysarrthria.  CT reveals subtle blurring of gray-white matter differentiation in the right frontal lobe and right operculum, which may reflect evolving ischemic right MCA territory infarct.   PT Comments   Pt demos good use of R side to A with mobility.  Pt indicates no improvement in L sided strength.  Will continue follow.    Follow Up Recommendations  CIR     Does the patient have the potential to tolerate intense rehabilitation     Barriers to Discharge        Equipment Recommendations   (TBD)    Recommendations for Other Services    Frequency Min 4X/week   Progress towards PT Goals Progress towards PT goals: Progressing toward goals  Plan Current plan remains appropriate    Precautions / Restrictions Precautions Precautions: Fall Restrictions Weight Bearing Restrictions: No   Pertinent Vitals/Pain Denied pain.     Mobility  Bed Mobility Overal bed mobility: Needs Assistance Bed Mobility: Supine to Sit Supine to sit: Mod assist;HOB elevated General bed mobility comments: Pt utilizes bed rail with R UE to A with bringing her trunk up to sitting.  A with L LE and L side.   Transfers Overall transfer level: Needs assistance Equipment used: 2 person hand held assist Transfers: Sit to/from [employer] Sit to Stand: Max assist;+2 physical assistance Stand pivot transfers: Max assist;+2 physical assistance General transfer comment: cues and facilitation for positioning LEs, maintaining midline balance with coming to stand, blocking R LE, A moving R LE through pivot, cues for completing pivot prior to sitting as pt tends to try to sit prematurely.   Modified Rankin (Stroke Patients  Only) Pre-Morbid Rankin Score: No symptoms Modified Rankin: Severe disability    Exercises     PT Diagnosis:    PT Problem List:   PT Treatment Interventions:     PT Goals (current goals can now be found in the care plan section) Acute Rehab PT Goals Patient Stated Goal: Go back to normal Time For Goal Achievement: 01/24/14 Potential to Achieve Goals: Good  Visit Information  Last PT Received On: 01/14/14 Assistance Needed: +2 History of Present Illness: Pt presents with L sided weakness and dysarrthria.  CT reveals subtle blurring of gray-white matter differentiation in the right frontal lobe and right operculum, which may reflect evolving ischemic right MCA territory infarct.    Subjective Data  Patient Stated Goal: Go back to normal   Cognition  Cognition Arousal/Alertness: Awake/alert Behavior During Therapy: WFL for tasks assessed/performed Overall Cognitive Status: Within Functional Limits for tasks assessed    Balance  Balance Overall balance assessment: Needs assistance Sitting-balance support: Single extremity supported;Feet supported Sitting balance-Leahy Scale: Fair Standing balance support: Bilateral upper extremity supported Standing balance-Leahy Scale: Zero Standing balance comment: pt needs L knee blocked and A to maintain balance in midline.    End of Session PT - End of Session Equipment Utilized During Treatment: Gait belt Activity Tolerance: Patient tolerated treatment well Patient left: in chair;with call bell/phone within reach Nurse Communication: Mobility status   GP     Sunny Schlein, [place] [phone] 01/14/2014, 11:13 AM

## 2014-01-15 ENCOUNTER — Inpatient Hospital Stay (HOSPITAL_COMMUNITY)
Admission: RE | Admit: 2014-01-15 | Discharge: 2014-02-05 | DRG: 945 | Disposition: A | Discharge status: Home Health | Payer: Medicaid Other | Source: Intra-hospital | Attending: Physical Medicine & Rehabilitation | Admitting: Physical Medicine & Rehabilitation

## 2014-01-15 ENCOUNTER — Encounter (HOSPITAL_COMMUNITY): Payer: Self-pay | Admitting: Cardiovascular Disease

## 2014-01-15 DIAGNOSIS — M545 Low back pain, unspecified: Secondary | ICD-10-CM

## 2014-01-15 DIAGNOSIS — I2510 Atherosclerotic heart disease of native coronary artery without angina pectoris: Secondary | ICD-10-CM

## 2014-01-15 DIAGNOSIS — R29810 Facial weakness: Secondary | ICD-10-CM

## 2014-01-15 DIAGNOSIS — I10 Essential (primary) hypertension: Secondary | ICD-10-CM

## 2014-01-15 DIAGNOSIS — K219 Gastro-esophageal reflux disease without esophagitis: Secondary | ICD-10-CM

## 2014-01-15 DIAGNOSIS — I428 Other cardiomyopathies: Secondary | ICD-10-CM

## 2014-01-15 DIAGNOSIS — I6340 Cerebral infarction due to embolism of unspecified cerebral artery: Secondary | ICD-10-CM

## 2014-01-15 DIAGNOSIS — Z950 Presence of cardiac pacemaker: Secondary | ICD-10-CM

## 2014-01-15 DIAGNOSIS — R339 Retention of urine, unspecified: Secondary | ICD-10-CM

## 2014-01-15 DIAGNOSIS — G894 Chronic pain syndrome: Secondary | ICD-10-CM

## 2014-01-15 DIAGNOSIS — Z634 Disappearance and death of family member: Secondary | ICD-10-CM

## 2014-01-15 DIAGNOSIS — Z79899 Other long term (current) drug therapy: Secondary | ICD-10-CM

## 2014-01-15 DIAGNOSIS — E1140 Type 2 diabetes mellitus with diabetic neuropathy, unspecified: Secondary | ICD-10-CM

## 2014-01-15 DIAGNOSIS — E1165 Type 2 diabetes mellitus with hyperglycemia: Secondary | ICD-10-CM

## 2014-01-15 DIAGNOSIS — I509 Heart failure, unspecified: Secondary | ICD-10-CM

## 2014-01-15 DIAGNOSIS — G8190 Hemiplegia, unspecified affecting unspecified side: Secondary | ICD-10-CM

## 2014-01-15 DIAGNOSIS — N390 Urinary tract infection, site not specified: Secondary | ICD-10-CM

## 2014-01-15 DIAGNOSIS — R471 Dysarthria and anarthria: Secondary | ICD-10-CM

## 2014-01-15 DIAGNOSIS — M47817 Spondylosis without myelopathy or radiculopathy, lumbosacral region: Secondary | ICD-10-CM

## 2014-01-15 DIAGNOSIS — Z5189 Encounter for other specified aftercare: Principal | ICD-10-CM

## 2014-01-15 DIAGNOSIS — E1149 Type 2 diabetes mellitus with other diabetic neurological complication: Secondary | ICD-10-CM

## 2014-01-15 DIAGNOSIS — I5042 Chronic combined systolic (congestive) and diastolic (congestive) heart failure: Secondary | ICD-10-CM

## 2014-01-15 DIAGNOSIS — R259 Unspecified abnormal involuntary movements: Secondary | ICD-10-CM

## 2014-01-15 DIAGNOSIS — E1142 Type 2 diabetes mellitus with diabetic polyneuropathy: Secondary | ICD-10-CM

## 2014-01-15 DIAGNOSIS — M47816 Spondylosis without myelopathy or radiculopathy, lumbar region: Secondary | ICD-10-CM

## 2014-01-15 DIAGNOSIS — Z7982 Long term (current) use of aspirin: Secondary | ICD-10-CM

## 2014-01-15 DIAGNOSIS — I639 Cerebral infarction, unspecified: Secondary | ICD-10-CM

## 2014-01-15 DIAGNOSIS — E785 Hyperlipidemia, unspecified: Secondary | ICD-10-CM

## 2014-01-15 DIAGNOSIS — IMO0002 Reserved for concepts with insufficient information to code with codable children: Secondary | ICD-10-CM

## 2014-01-15 DIAGNOSIS — G4733 Obstructive sleep apnea (adult) (pediatric): Secondary | ICD-10-CM

## 2014-01-15 DIAGNOSIS — I5032 Chronic diastolic (congestive) heart failure: Secondary | ICD-10-CM

## 2014-01-15 DIAGNOSIS — Z794 Long term (current) use of insulin: Secondary | ICD-10-CM

## 2014-01-15 LAB — GLUCOSE, CAPILLARY
GLUCOSE-CAPILLARY: 184 mg/dL — AB (ref 70–99)
GLUCOSE-CAPILLARY: 187 mg/dL — AB (ref 70–99)
Glucose-Capillary: 229 mg/dL — ABNORMAL HIGH (ref 70–99)
Glucose-Capillary: 216 mg/dL — ABNORMAL HIGH (ref 70–99)

## 2014-01-15 LAB — BASIC METABOLIC PANEL
BUN: 10 mg/dL (ref 6–23)
CHLORIDE: 102 meq/L (ref 96–112)
CO2: 23 meq/L (ref 19–32)
CREATININE: 0.79 mg/dL (ref 0.50–1.10)
Calcium: 9.2 mg/dL (ref 8.4–10.5)
GFR calc non Af Amer: >90 mL/min (ref >90)
GLUCOSE: 203 mg/dL — AB (ref 70–99)
Potassium: 3.7 mEq/L (ref 3.7–5.3)
Sodium: 140 mEq/L (ref 137–147)

## 2014-01-15 LAB — CBC
HEMATOCRIT: 39.4 % (ref 36.0–46.0)
HEMOGLOBIN: 12.9 g/dL (ref 12.0–15.0)
MCH: 28 pg (ref 26.0–34.0)
MCHC: 32.7 g/dL (ref 30.0–36.0)
MCV: 85.5 fL (ref 78.0–100.0)
Platelets: 219 10*3/uL (ref 150–400)
RBC: 4.61 MIL/uL (ref 3.87–5.11)
RDW: 13.1 % (ref 11.5–15.5)
WBC: 10.4 10*3/uL (ref 4.0–10.5)

## 2014-01-15 LAB — CREATININE, SERUM
Creatinine, Ser: 0.87 mg/dL (ref 0.50–1.10)
GFR calc non Af Amer: 78 mL/min — ABNORMAL LOW (ref >90)

## 2014-01-15 MED ORDER — DIGOXIN 125 MCG PO TABS
0.1250 mg | ORAL_TABLET | Freq: Every day | ORAL | Status: DC
  Administered 2014-01-16 – 2014-02-05 (×21): 0.125 mg via ORAL
  Filled 2014-01-15 (×23): qty 1

## 2014-01-15 MED ORDER — INSULIN GLARGINE 100 UNIT/ML SOLOSTAR PEN: 18.0000 [IU] | PEN_INJECTOR | Freq: Every day | SUBCUTANEOUS | Status: DC

## 2014-01-15 MED ORDER — ACETAMINOPHEN 325 MG PO TABS
650.0000 mg | ORAL_TABLET | Freq: Four times a day (QID) | ORAL | Status: DC | PRN
  Administered 2014-01-19 – 2014-02-05 (×32): 650 mg via ORAL
  Filled 2014-01-15 (×35): qty 2

## 2014-01-15 MED ORDER — PANTOPRAZOLE SODIUM 40 MG PO TBEC
40.0000 mg | DELAYED_RELEASE_TABLET | Freq: Every day | ORAL | Status: DC
  Administered 2014-01-16 – 2014-02-05 (×21): 40 mg via ORAL
  Filled 2014-01-15 (×18): qty 1

## 2014-01-15 MED ORDER — METOPROLOL SUCCINATE ER 25 MG PO TB24
25.0000 mg | ORAL_TABLET | Freq: Two times a day (BID) | ORAL | Status: DC
  Administered 2014-01-15 – 2014-02-05 (×42): 25 mg via ORAL
  Filled 2014-01-15 (×44): qty 1

## 2014-01-15 MED ORDER — ONDANSETRON HCL 4 MG/2ML IJ SOLN: 4.0000 mg | Freq: Four times a day (QID) | INTRAMUSCULAR | Status: DC | PRN

## 2014-01-15 MED ORDER — ONDANSETRON HCL 4 MG PO TABS: 4.0000 mg | ORAL_TABLET | Freq: Four times a day (QID) | ORAL | Status: DC | PRN

## 2014-01-15 MED ORDER — INSULIN ASPART 100 UNIT/ML ~~LOC~~ SOLN: 2.0000 [IU] | Freq: Three times a day (TID) | SUBCUTANEOUS | Status: DC

## 2014-01-15 MED ORDER — BISACODYL 10 MG RE SUPP
10.0000 mg | Freq: Once | RECTAL | Status: AC
  Administered 2014-01-15: 10 mg via RECTAL
  Filled 2014-01-15: qty 1

## 2014-01-15 MED ORDER — ASPIRIN 325 MG PO TBEC: 325.0000 mg | DELAYED_RELEASE_TABLET | Freq: Every day | ORAL | Status: DC

## 2014-01-15 MED ORDER — INSULIN GLARGINE 100 UNIT/ML SOLOSTAR PEN: 16.0000 [IU] | PEN_INJECTOR | Freq: Every day | SUBCUTANEOUS | Status: DC

## 2014-01-15 MED ORDER — INSULIN GLARGINE 100 UNIT/ML ~~LOC~~ SOLN
14.0000 [IU] | Freq: Every day | SUBCUTANEOUS | Status: DC
Start: 2014-01-15 — End: 2014-01-17
  Administered 2014-01-15 – 2014-01-16 (×2): 14 [IU] via SUBCUTANEOUS
  Filled 2014-01-15 (×2): qty 0.14

## 2014-01-15 MED ORDER — INSULIN ASPART 100 UNIT/ML ~~LOC~~ SOLN
0.0000 [IU] | Freq: Three times a day (TID) | SUBCUTANEOUS | Status: DC
  Administered 2014-01-16: 2 [IU] via SUBCUTANEOUS
  Administered 2014-01-16: 3 [IU] via SUBCUTANEOUS
  Administered 2014-01-16: 2 [IU] via SUBCUTANEOUS
  Administered 2014-01-17: 5 [IU] via SUBCUTANEOUS
  Administered 2014-01-17: 2 [IU] via SUBCUTANEOUS
  Administered 2014-01-17 – 2014-01-18 (×2): 3 [IU] via SUBCUTANEOUS
  Administered 2014-01-18 – 2014-01-19 (×4): 2 [IU] via SUBCUTANEOUS
  Administered 2014-01-19 – 2014-01-20 (×2): 3 [IU] via SUBCUTANEOUS
  Administered 2014-01-20: 5 [IU] via SUBCUTANEOUS
  Administered 2014-01-20 – 2014-01-21 (×3): 3 [IU] via SUBCUTANEOUS
  Administered 2014-01-21: 2 [IU] via SUBCUTANEOUS
  Administered 2014-01-22 (×3): 3 [IU] via SUBCUTANEOUS
  Administered 2014-01-23: 5 [IU] via SUBCUTANEOUS
  Administered 2014-01-23: 2 [IU] via SUBCUTANEOUS
  Administered 2014-01-23 – 2014-01-24 (×3): 3 [IU] via SUBCUTANEOUS
  Administered 2014-01-24: 7 [IU] via SUBCUTANEOUS
  Administered 2014-01-25 (×2): 3 [IU] via SUBCUTANEOUS
  Administered 2014-01-25 – 2014-01-26 (×4): 5 [IU] via SUBCUTANEOUS
  Administered 2014-01-27: 3 [IU] via SUBCUTANEOUS
  Administered 2014-01-27: 5 [IU] via SUBCUTANEOUS
  Administered 2014-01-27 – 2014-01-28 (×3): 3 [IU] via SUBCUTANEOUS
  Administered 2014-01-28: 5 [IU] via SUBCUTANEOUS
  Administered 2014-01-29: 2 [IU] via SUBCUTANEOUS
  Administered 2014-01-29: 5 [IU] via SUBCUTANEOUS
  Administered 2014-01-29: 3 [IU] via SUBCUTANEOUS
  Administered 2014-01-30 (×2): 5 [IU] via SUBCUTANEOUS
  Administered 2014-01-30 – 2014-01-31 (×4): 3 [IU] via SUBCUTANEOUS
  Administered 2014-02-01 (×2): 5 [IU] via SUBCUTANEOUS
  Administered 2014-02-01: 3 [IU] via SUBCUTANEOUS
  Administered 2014-02-02 – 2014-02-03 (×5): 5 [IU] via SUBCUTANEOUS
  Administered 2014-02-03 – 2014-02-04 (×3): 3 [IU] via SUBCUTANEOUS
  Administered 2014-02-04: 2 [IU] via SUBCUTANEOUS
  Administered 2014-02-05: 3 [IU] via SUBCUTANEOUS

## 2014-01-15 MED ORDER — CARBAMAZEPINE 200 MG PO TABS
200.0000 mg | ORAL_TABLET | Freq: Two times a day (BID) | ORAL | Status: DC
  Administered 2014-01-15 – 2014-02-05 (×42): 200 mg via ORAL
  Filled 2014-01-15 (×44): qty 1

## 2014-01-15 MED ORDER — ALBUTEROL SULFATE (2.5 MG/3ML) 0.083% IN NEBU: 2.5000 mg | INHALATION_SOLUTION | Freq: Four times a day (QID) | RESPIRATORY_TRACT | Status: DC | PRN

## 2014-01-15 MED ORDER — SORBITOL 70 % SOLN
30.0000 mL | Freq: Every day | Status: DC | PRN
  Administered 2014-01-21 – 2014-01-29 (×3): 30 mL via ORAL
  Administered 2014-01-31: 15 mL via ORAL
  Administered 2014-02-03: 30 mL via ORAL
  Filled 2014-01-15 (×6): qty 30

## 2014-01-15 MED ORDER — SIMVASTATIN 40 MG PO TABS
40.0000 mg | ORAL_TABLET | Freq: Every day | ORAL | Status: DC
  Administered 2014-01-16 – 2014-02-04 (×20): 40 mg via ORAL
  Filled 2014-01-15 (×21): qty 1

## 2014-01-15 MED ORDER — ASPIRIN EC 325 MG PO TBEC
325.0000 mg | DELAYED_RELEASE_TABLET | Freq: Every day | ORAL | Status: DC
  Administered 2014-01-16 – 2014-02-05 (×21): 325 mg via ORAL
  Filled 2014-01-15 (×22): qty 1

## 2014-01-15 MED ORDER — HEPARIN SODIUM (PORCINE) 5000 UNIT/ML IJ SOLN: 5000.0000 [IU] | Freq: Three times a day (TID) | INTRAMUSCULAR | Status: DC

## 2014-01-15 MED ORDER — HEPARIN SODIUM (PORCINE) 5000 UNIT/ML IJ SOLN
5000.0000 [IU] | Freq: Three times a day (TID) | INTRAMUSCULAR | Status: DC
Start: 2014-01-15 — End: 2014-02-05
  Administered 2014-01-15 – 2014-02-05 (×62): 5000 [IU] via SUBCUTANEOUS
  Filled 2014-01-15 (×65): qty 1

## 2014-01-15 NOTE — Progress Notes (Signed)
Rehab admissions - I called and spoke with patient's daughter this am.  Daughter and son plan to provide care at patient's home after rehab stay.  I spoke with patient and she is in agreement to inpatient rehab stay.  I called and spoke with resident, Dr. Sherrine Maples and will plan to admit to inpatient rehab today.  Unit RN and case manager are aware.  Call me for questions.  #[phone]

## 2014-01-15 NOTE — Progress Notes (Signed)
Patient ID: [name], female   DOB: 03/15/1966, 48 y.o.   MRN: [id number] Patient was admitted to 4W09  from 4N12. Admission vital sign are stable.

## 2014-01-15 NOTE — H&P (View-Only) (Signed)
Physical Medicine and Rehabilitation Admission H&P    Chief Complaint  Patient presents with  . Cerebrovascular Accident  ::  Chief complaint: Weakness  HPI: [name] is a 48 y.o. right-handed female with history of uncontrolled hypertension, diabetes mellitus peripheral neuropathy, morbid obesity, nonischemic cardiomyopathy, chronic diastolic heart failure, CAD status post pacemaker placement and chronic back pain followed at outpatient rehabilitation services. Admitted 01/09/2014 with left facial weakness and slurred speech. Patient with previous admission 10/07/2013 with similar symptoms workup negative as per neurology services. Cranial CT scan consistent with possible right middle cerebral artery territory infarct. MRI not completed secondary to pacemaker. Patient did not receive TPA. CTA view of the head negative for hemorrhage did show occluded branch MCA vessel. CTA  of the neck was negative.. Echocardiogram with ejection fraction of 40% no wall motion abnormalities.. TEE showed moderately depressed LVEF without thrombus or vegetation. . Currently maintained on aspirin for CVA prophylaxis. Subcutaneous heparin for DVT prophylaxis. Diet has been advanced to a regular consistency.  Physical and occupational therapy evaluations completed 01/10/2014 with recommendations for physical medicine rehabilitation consult. Patient was admitted for comprehensive rehabilitation program   ROS Review of Systems  Respiratory: Positive for shortness of breath.  Cardiovascular: Positive for palpitations.  Gastrointestinal: Positive for constipation.  Musculoskeletal: Positive for back pain, joint pain and myalgias.  All other systems reviewed and are negative  Past Medical History  Diagnosis Date  . Hypertension     Poorly controlled. Has had HTN since age 4. Angioedema with ACEI.  24 Hr urine and renal arterial dopplers ordered . . . Never done  . Type II or unspecified type diabetes  mellitus without mention of complication, uncontrolled DX: 2002  . NICM (nonischemic cardiomyopathy)     EF 45-50% in 8/12, cath 6/12 showed normal coronaries, EF 50-55% by LV gram  . Morbid obesity   . Allergic rhinitis   . HLD (hyperlipidemia)   . OSA on CPAP     sleep study in 8/12 showed moderate to severe OSA requiring CPAP  . Chronic lower back pain     secondary to DJD, obsetiy, hip problems. Followed by Dr. Oval Linsey (pain management)  . 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 Chronic diastolic heart failure      Primarily diastolic CHF: Likely due to uncontrolled HTN. Last echo (8/12) with EF 45-50%, mild to moderate LVH with some asymmetric septal hypertrophy, RV normal size and systolic function. EF 50-55% by LV-gram in 6/12.   . Degeneration of lumbar or lumbosacral intervertebral disc   . Polyneuropathy in diabetes(357.2)   . Thoracic or lumbosacral neuritis or radiculitis, unspecified   . Calcifying tendinitis of shoulder   . LBBB (left bundle branch block)   . Chronic combined systolic and diastolic CHF (congestive heart failure)     EF 40-45% by echo 12/06/2012  . Coronary artery disease     questionable. LHC 05/2011 showing normal coronaries // Followed at [facility], Dr. Aundra Dubin  . GERD (gastroesophageal reflux disease)    Past Surgical History  Procedure Laterality Date  . Breast biopsies Bilateral 2011    patient reports benign results  . Tubal ligation  05/31/1985  . Hand surgery Left   . Shoulder surgery Right   . Dental surgery      tumors removed   . Cardiac catheterization  05/2011  . Pacemaker/defibrillator placed  08/22/13 Left 08/22/13  . Tee without cardioversion N/A 01/14/2014  Procedure: TRANSESOPHAGEAL ECHOCARDIOGRAM (TEE);  Surgeon: Sanda Klein, MD;  Location: [facility] ENDOSCOPY;  Service: Cardiovascular;  Laterality: N/A;   Family History  Problem Relation Age of Onset  . Heart disease Mother 46    Died of MI at age 68  yo  . Heart disease Paternal Grandmother     requiring pacemaker.  [name] Heart disease Paternal Grandfather 13    Died of MI at possibly age 21-53yo  . Stroke Paternal Grandfather   . Heart disease Father 24    MI age 9yo requiring stenting  . Kidney disease Mother     requiring dialysis  . Congestive Heart Failure Mother   . Diabetes Father   . Diabetes Brother   . Heart disease Brother 70    MI at age 6 years old  . Breast cancer Paternal Aunt   . Breast cancer Maternal Grandmother    Social History:  reports that she has never smoked. She has never used smokeless tobacco. She reports that she does not drink alcohol or use illicit drugs. Allergies:  Allergies  Allergen Reactions  . Clindamycin Anaphylaxis and Swelling  . Enalapril Maleate Swelling  . Lisinopril Swelling   Medications Prior to Admission  Medication Sig Dispense Refill  . amLODipine (NORVASC) 5 MG tablet Take 1 tablet (5 mg total) by mouth daily.  30 tablet  3  . aspirin EC 81 MG tablet Take 81 mg by mouth daily.       . carbamazepine (TEGRETOL) 200 MG tablet Take 1 tablet (200 mg total) by mouth 2 (two) times daily.  60 tablet  5  . cyclobenzaprine (FLEXERIL) 10 MG tablet Take 1 tablet (10 mg total) by mouth 3 (three) times daily as needed for muscle spasms.  30 tablet  2  . diclofenac sodium (VOLTAREN) 1 % GEL Apply 2 g topically 4 (four) times daily as needed (for pain).  3 Tube  2  . digoxin (LANOXIN) 0.125 MG tablet Take 1 tablet (0.125 mg total) by mouth daily.  30 tablet  11  . fluconazole (DIFLUCAN) 150 MG tablet Take 1 tablet (150 mg total) by mouth every 3 (three) days.  3 tablet  0  . furosemide (LASIX) 40 MG tablet Take 1 tablet (40 mg total) by mouth 2 (two) times daily.  60 tablet  5  . hydrALAZINE (APRESOLINE) 100 MG tablet Take 1 tablet (100 mg total) by mouth 3 (three) times daily.  90 tablet  10  . Insulin Glargine (LANTUS SOLOSTAR) 100 UNIT/ML Solostar Pen Inject 27 Units into the skin at  bedtime.  5 pen  11  . isosorbide mononitrate (IMDUR) 60 MG 24 hr tablet Take 1 tablet (60 mg total) by mouth daily.  30 tablet  11  . metoprolol succinate (TOPROL-XL) 50 MG 24 hr tablet Take 1 tablet (50 mg total) by mouth 2 (two) times daily.  60 tablet  3  . pantoprazole (PROTONIX) 40 MG tablet Take 1 tablet (40 mg total) by mouth daily.  30 tablet  5  . spironolactone (ALDACTONE) 25 MG tablet Take 1 tablet (25 mg total) by mouth daily.  90 tablet  3  . albuterol (PROVENTIL HFA;VENTOLIN HFA) 108 (90 BASE) MCG/ACT inhaler Inhale 2 puffs into the lungs every 6 (six) hours as needed for wheezing or shortness of breath.      . insulin aspart (NOVOLOG FLEXPEN) 100 UNIT/ML injection Inject 12-13 Units into the skin 3 (three) times daily before meals.  1 vial  11  .  oxyCODONE-acetaminophen (PERCOCET) 10-325 MG per tablet Take 1 tablet by mouth every 8 (eight) hours as needed for pain. Must last 30 days. For pain  70 tablet  0  . simvastatin (ZOCOR) 40 MG tablet Take 40 mg by mouth daily at 6 PM.        Home: Home Living Family/patient expects to be discharged to:: Inpatient rehab   Functional History:    Functional Status:  Mobility:          ADL: ADL Eating/Feeding: Simulated;Minimal assistance;Set up;Other (comment) (VC's to clear L side of mouth/sweep) Where Assessed - Eating/Feeding: Edge of bed Grooming: Performed;Wash/dry hands;Wash/dry face;Teeth care;Moderate assistance (Sitting EOB) Where Assessed - Grooming: Unsupported sitting Upper Body Bathing: Performed;Chest;Left arm;Abdomen;Moderate assistance (Sitting EOB) Where Assessed - Upper Body Bathing: Unsupported sitting Lower Body Bathing: Simulated;+1 Total assistance Where Assessed - Lower Body Bathing: Supine, head of bed flat;Rolling right and/or left Upper Body Dressing: Performed;+1 Total assistance Where Assessed - Upper Body Dressing: Unsupported sitting Lower Body Dressing: Simulated;+2 Total assistance Where  Assessed - Lower Body Dressing: Supported sit to stand;Other (comment) (Block L LE) Toilet Transfer: Simulated;+2 Total assistance Toilet Transfer Method: Stand pivot Science writer: Extra wide drop arm bedside commode (Needs Extra wide drop arm commode in room) Tub/Shower Transfer Method: Not assessed Transfers/Ambulation Related to ADLs: Pt performed bed mobility w/ Mod-max assist x1 today. ADL Comments: Pt was educated in role of OT: She is L hand dominant w/ L side flacid at this time, c/o paresthesias/numbness throughout left side. Pt participated in ADL retraiing session with Mod A sitting EOB. Occasional vc's for shifting weight to midline. Discussed ADL's & recommendation of CIR to assist w/ maximizing I w/ ADL's/transfers and pt verbalized agreement w/ this.  Cognition: Cognition Overall Cognitive Status: Within Functional Limits for tasks assessed Orientation Level: Oriented X4 Cognition Arousal/Alertness: Awake/alert Behavior During Therapy: WFL for tasks assessed/performed Overall Cognitive Status: Within Functional Limits for tasks assessed  Physical Exam: Blood pressure 139/92, pulse 90, temperature 98.1 F (36.7 C), temperature source Oral, resp. rate 20, height _0  (1.753 m), weight 123.8 kg (272 lb 14.9 oz), last menstrual period 12/21/2013, SpO2 97.00%. Physical Exam Constitutional:  47 year old African American obese female  HEENT: oral mucosa pink and moist Eyes: EOM are normal.  Pupils reactive to light  Neck: Normal range of motion. Neck supple. No thyromegaly present.  Cardiovascular:  Cardiac rate controlled without murmur, rub, gallops Respiratory: Effort normal and breath sounds normal. No respiratory distress.  GI: Soft. Bowel sounds are normal. There is no tenderness.  Neurological: She is alert today.  Speech is dysarthric but very intelligible. She is oriented x3. Patient follows simple commands. Left facial droop and tongue deviation  persistent.  Dense left HP, (0/5) proximal to distal, both LUE and LLE.  Does sense gross touch and general pain in the left face, arm and leg. Left inattention. Appears to have reasonable insight and awareness Skin: Skin is warm and dry Psych: mood fairly pleasant, she is cooperative.   Results for orders placed during the hospital encounter of 01/09/14 (from the past 48 hour(s))  GLUCOSE, CAPILLARY     Status: Abnormal   Collection Time    01/13/14 11:51 AM      Result Value Range   Glucose-Capillary 277 (*) 70 - 99 mg/dL   Comment 1 Notify RN     Comment 2 Documented in Chart    GLUCOSE, CAPILLARY     Status: Abnormal   Collection Time  01/13/14  4:51 PM      Result Value Range   Glucose-Capillary 159 (*) 70 - 99 mg/dL   Comment 1 Notify RN     Comment 2 Documented in Chart    GLUCOSE, CAPILLARY     Status: Abnormal   Collection Time    01/13/14  9:01 PM      Result Value Range   Glucose-Capillary 196 (*) 70 - 99 mg/dL   Comment 1 Documented in Chart     Comment 2 Notify RN    GLUCOSE, CAPILLARY     Status: Abnormal   Collection Time    01/14/14  7:08 AM      Result Value Range   Glucose-Capillary 176 (*) 70 - 99 mg/dL   Comment 1 Notify RN     Comment 2 Documented in Chart    GLUCOSE, CAPILLARY     Status: Abnormal   Collection Time    01/14/14 12:46 PM      Result Value Range   Glucose-Capillary 203 (*) 70 - 99 mg/dL   Comment 1 Notify RN    GLUCOSE, CAPILLARY     Status: Abnormal   Collection Time    01/14/14  4:50 PM      Result Value Range   Glucose-Capillary 175 (*) 70 - 99 mg/dL  GLUCOSE, CAPILLARY     Status: Abnormal   Collection Time    01/14/14 10:11 PM      Result Value Range   Glucose-Capillary 241 (*) 70 - 99 mg/dL   Comment 1 Notify RN     Comment 2 Documented in Chart    BASIC METABOLIC PANEL     Status: Abnormal   Collection Time    01/15/14  4:56 AM      Result Value Range   Sodium 140  137 - 147 mEq/L   Potassium 3.7  3.7 - 5.3 mEq/L    Chloride 102  96 - 112 mEq/L   CO2 23  19 - 32 mEq/L   Glucose, Bld 203 (*) 70 - 99 mg/dL   BUN 10  6 - 23 mg/dL   Creatinine, Ser 0.79  0.50 - 1.10 mg/dL   Calcium 9.2  8.4 - 10.5 mg/dL   GFR calc non Af Amer >90  >90 mL/min   GFR calc Af Amer >90  >90 mL/min   Comment: (NOTE)     The eGFR has been calculated using the CKD EPI equation.     This calculation has not been validated in all clinical situations.     eGFR's persistently <90 mL/min signify possible Chronic Kidney     Disease.  GLUCOSE, CAPILLARY     Status: Abnormal   Collection Time    01/15/14  6:50 AM      Result Value Range   Glucose-Capillary 184 (*) 70 - 99 mg/dL   Comment 1 Notify RN     Comment 2 Documented in Chart     No results found.  Post Admission Physician Evaluation: 1. Functional deficits secondary  to cardioembolic right MCA infarct. 2. Patient is admitted to receive collaborative, interdisciplinary care between the physiatrist, rehab nursing staff, and therapy team. 3. Patient's level of medical complexity and substantial therapy needs in context of that medical necessity cannot be provided at a lesser intensity of care such as a SNF. 4. Patient has experienced substantial functional loss from his/her baseline which was documented above under the "Functional History" and "Functional Status" headings.  Judging by  the patient's diagnosis, physical exam, and functional history, the patient has potential for functional progress which will result in measurable gains while on inpatient rehab.  These gains will be of substantial and practical use upon discharge  in facilitating mobility and self-care at the household level. 5. Physiatrist will provide 24 hour management of medical needs as well as oversight of the therapy plan/treatment and provide guidance as appropriate regarding the interaction of the two. 6. 24 hour rehab nursing will assist with bladder management, bowel management, safety, skin/wound care,  disease management, medication administration, pain management and patient education  and help integrate therapy concepts, techniques,education, etc. 7. PT will assess and treat for/with: Lower extremity strength, range of motion, stamina, balance, functional mobility, safety, adaptive techniques and equipment, NMR, visual spatial awareness, pt and family education.   Goals are: min to mod assist. 8. OT will assess and treat for/with: ADL's, functional mobility, safety, upper extremity strength, adaptive techniques and equipment, NMR, visual spatial awareness, pt and family education.   Goals are: min to mod assist. 9. SLP will assess and treat for/with: speech, communication, swallowing.  Goals are: mod I to supervision. 10. Case Management and Social Worker will assess and treat for psychological issues and discharge planning. 11. Team conference will be held weekly to assess progress toward goals and to determine barriers to discharge. 12. Patient will receive at least 3 hours of therapy per day at least 5 days per week. 13. ELOS: 22-28 days  14. Prognosis:  good   Medical Problem List and Plan: 1. Cardioembolic right MCA infarct 2. DVT Prophylaxis/Anticoagulation: Subcutaneous heparin. Monitor platelet counts and any signs of bleeding 3. Pain Management/chronic pain syndrome: Tegretol 200 mg twice a day for neuropathic pain. Patient also on Flexeril prior to admission but held due to some lethargy. 4. Neuropsych: This patient is capable of making decisions on her own behalf. 5. Nonischemic cardiomyopathy, CAD with pacemaker. No chest pain or shortness of breath 6. Diabetes mellitus with peripheral neuropathy. Hemoglobin A1c 10.7. Lantus insulin 14 units each bedtime. Check blood sugars a.c. and at bedtime 7. Hypertension. Toprol-XL 25 mg twice a day, Lanoxin 0.125 mg daily. Monitor with increased activity. Recent blood pressure meds held due to some hypotension we'll resume as tolerated 8.  Chronic diastolic heart failure. Monitor for any signs of fluid overload. Daily i's and o's and weights. Patient on Lasix 40 mg twice a day prior to admission. Consider resuming back as needed 9. Hyperlipidemia. Lipitor  Meredith Staggers, MD, [facility]  01/15/2014

## 2014-01-15 NOTE — Progress Notes (Signed)
Patient information reviewed and entered into [facility] system by Altagracia Rone, RN, CRRN, PPS Coordinator.  Information including medical coding and functional independence measure will be reviewed and updated through discharge.    

## 2014-01-15 NOTE — Progress Notes (Signed)
Stroke Team Progress Note  HISTORY [name] is a 48 y.o. female, right handed, with a past medical history significant for HTN, DM, hyperlipidemia, morbid obesity, nonischemic cardiomyopathy, chronic diastolic heart failure, CAD, s/p pacemaker placement, OSA on CPAP, DJD, GERD, brought to [facility] ED, 01/09/2014, for further evaluation of dysarthria with left face and arm weakness. She stated that around 10 pm on the evening of 01/08/2014 her speech became slurred and subsequently the left face was droopier than the right face. Then, she started noticing weakness and numbness of the left arm. Previous admission to [facility] with language impairment but unremarkable neurological work up. Denies HA, vertigo, double vision, difficulty swallowing, confusion, or visual disturbances. CT brain on the day of admission revealed no acute abnormality. She was on aspirin 81 mg daily prior to admission. No t-PA was given as the patient was out of the window for therapy.  SUBJECTIVE Family at bedside  OBJECTIVE Most recent Vital Signs: Filed Vitals:   01/14/14 2214 01/14/14 2218 01/15/14 0645 01/15/14 1013  BP: 152/96  139/92 140/86  Pulse: 106 106 90 92  Temp: 98 F (36.7 C)  98.1 F (36.7 C) 98.2 F (36.8 C)  TempSrc: Oral  Oral Oral  Resp: 18 16 20 20   Height:      Weight:   123.8 kg (272 lb 14.9 oz)   SpO2: 94% 93% 97% 95%   CBG (last 3)   Recent Labs  01/14/14 1650 01/14/14 2211 01/15/14 0650  GLUCAP 175* 241* 184*    IV Fluid Intake:      MEDICATIONS  . aspirin EC  325 mg Oral Daily  . carbamazepine  200 mg Oral BID  . digoxin  0.125 mg Oral Daily  . heparin  5,000 Units Subcutaneous Q8H  . insulin aspart  0-9 Units Subcutaneous TID WC  . insulin glargine  14 Units Subcutaneous QHS  . metoprolol succinate  25 mg Oral BID  . pantoprazole  40 mg Oral Daily  . simvastatin  40 mg Oral q1800   PRN:  acetaminophen, albuterol  Diet:  Cardiac 1 diet with thin liquids Activity:  BRP with  assistance DVT Prophylaxis: Subcutaneous heparin  CLINICALLY SIGNIFICANT STUDIES Basic Metabolic Panel:   Recent Labs Lab 01/10/14 0541 01/13/14 0426 01/15/14 0456  NA 135* 140 140  K 3.9 3.8 3.7  CL 98 101 102  CO2 25 23 23   GLUCOSE 247* 183* 203*  BUN 6 10 10   CREATININE 0.76 0.91 0.79  CALCIUM 8.8 9.1 9.2  MG 1.6 1.9  --    Liver Function Tests:   Recent Labs Lab 01/09/14 1109  AST 15  ALT 15  ALKPHOS 80  BILITOT 0.6  PROT 7.2  ALBUMIN 3.6   CBC:   Recent Labs Lab 01/09/14 1109 01/09/14 1135  WBC 10.4  --   HGB 12.8 13.9  HCT 37.7 41.0  MCV 82.7  --   PLT 213  --    Coagulation:   Recent Labs Lab 01/09/14 1940  LABPROT 13.5  INR 1.05   Cardiac Enzymes:   Recent Labs Lab 01/09/14 1940 01/10/14 0010  TROPONINI <0.30 <0.30   Urinalysis:   Recent Labs Lab 01/09/14 1126  COLORURINE YELLOW  LABSPEC 1.023  PHURINE 6.0  GLUCOSEU >1000*  HGBUR NEGATIVE  BILIRUBINUR NEGATIVE  KETONESUR NEGATIVE  PROTEINUR NEGATIVE  UROBILINOGEN 0.2  NITRITE POSITIVE*  LEUKOCYTESUR MODERATE*   Lipid Panel    Component Value Date/Time   CHOL 187 01/10/2014 0541  TRIG 194* 01/10/2014 0541   HDL 43 01/10/2014 0541   CHOLHDL 4.3 01/10/2014 0541   VLDL 39 01/10/2014 0541   LDLCALC 105* 01/10/2014 0541   HgbA1C  Lab Results  Component Value Date   HGBA1C 10.7 12/19/2013    Urine Drug Screen:     Component Value Date/Time   LABOPIA NONE DETECTED 01/09/2014 2030   COCAINSCRNUR NONE DETECTED 01/09/2014 2030   LABBENZ NONE DETECTED 01/09/2014 2030   AMPHETMU NONE DETECTED 01/09/2014 2030   THCU NONE DETECTED 01/09/2014 2030   LABBARB NONE DETECTED 01/09/2014 2030    Alcohol Level: No results found for this basename: ETH,  in the last 168 hours  Ct Head Wo Contrast 01/10/2014   1. Expected evolution of right MCA territory infarct. 2. No new areas of infarct. 01/10/2014   Subtle blurring of gray-white matter differentiation in the right frontal lobe and right  operculum, which may reflect evolving ischemic right MCA territory infarct. No intracranial hemorrhage.     Ct Head Wo Contrast 01/09/2014    1. Asymmetric right-sided white matter hypoattenuation is similar to the prior exam. 2. No acute intracranial abnormality. 3. Minimal sinus disease.    MRI/A of the brain  Pacemaker/defibrillator  CT Angiogram of the Head  01/11/2014 Acute right MCA infarct. Negative for hemorrhage. Occluded branch MCA vessel to this area.  CT Angiogram of the Neck 01/11/2014 Negative CTA of the neck  2D Echocardiogram  12/11/2013 that revealed ejection fraction of 30% with diffuse hypokinesis. No clot identified.  Carotid Doppler  - See CT angiogram of the neck.  TEE  Moderately depressed LVEF . No LA thrombus, normal appendage emptying velocity. No vegetations. Normal aorta. Intact atrial septum. No shunt by Doppler or saline contrast study. Incidental note of hyperechoic masses on the pacemaker leads in the SVC and right atrium. These are likely thrombotic, less likely infectious (not associated with stroke)  CXR  01/01/2014 - Cardiomegaly, unchanged. No acute cardiopulmonary process identified.  EKG   paced rhythm rate 79 beats per minute  Therapy Recommendations CIR  Physical Exam  -  Obese young African American lady currently not in distress.Awake alert. Afebrile. Head is nontraumatic. Neck is supple without bruit. Hearing is normal. Cardiac exam no murmur or gallop. Lungs are clear to auscultation. Distal pulses are well felt. Neurological Exam : Awake alert oriented x3. Dysarthria present. Extraocular movements are full range. Does not blink to threat as much on the left as she does on the right. Pupils equal reactive. Fundi were not visualized. Moderate left lower face and mild left upper face weakness. Tongue deviates to the left. Cough and gag weak. Significant left hemiplegia with 15 left upper extremity and lower extremity strength. Decreased tone on the  left. Decreased sensation on the left hemibody. Left plantar is upgoing and right is downgoing. Normal strength on the right side. Gait not tested.  ASSESSMENT [name] is a 48 y.o. female presenting with left upper extremity weakness and dysarthria. TPA was not given as the patient was out of the window for treatment. 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 The patient cannot have an MRI secondary to permanent pacemaker/defibrillator. CT angio head confirmed stroke due to right MCA branch vessel occlusion. It was felt that the infarct could possibly be cardioembolic. TEE unrevealing. On aspirin 81 mg orally every day prior to admission. Now on aspirin 325 mg orally every day for secondary stroke prevention.  Patient with neuro worsening of her hemiparesis->hemiplegia. Work up underway.   Left ventricular dysfunction with an ejection fraction of 30%  Status post permanent pacemaker/defibrillator  Previous stroke workup October 2014  Hypertension  Diabetes - hemoglobin A1c 10.7  Hyperlipidemia - LDL 105 - on Zocor prior to admission  Obesity  Coronary artery disease  Obstructive sleep apnea, refusing to use CPAP consistently in hospital  UTI - treated  Hospital day # 6  TREATMENT/PLAN  Continue aspirin 325 mg daily  Per cardiology discussion with Dr. Pearlean Brownie, pacemaker can be set and interrogated as an OP to follow for potential atrial fibrillation as source of stroke  Inpatient rehabilitation at time of discharge  No further stroke workup indicated.  Patient has a 10-15% risk of having another stroke over the next year, the highest risk is within 2 weeks of the most recent stroke/TIA (risk of having a stroke following a stroke or TIA is the same).  Ongoing risk factor control by Primary Care Physician  Stroke Service will sign off. Please call should any needs arise.  Follow up with Dr. Pearlean Brownie, Stroke  Clinic, in 2 months.  Annie Main, MSN, RN, ANVP-BC, ANP-BC, [facility] Pager: [phone] 01/15/2014 11:44 AM  I have personally obtained a history, examined the patient, evaluated imaging results, and formulated the assessment and plan of care. I agree with the above. Delia Heady, MD

## 2014-01-15 NOTE — Progress Notes (Signed)
Subjective: [name] was seen and examined by me this morning.  She is sitting up in chair.  She says she is still feeling weak.  She is tolerating diet.  She is agreeable to discharge to inpatient rehab.  Objective: Vital signs in last 24 hours: Filed Vitals:   01/14/14 1800 01/14/14 2214 01/14/14 2218 01/15/14 0645  BP: 134/82 152/96  139/92  Pulse: 107 106 106 90  Temp: 98.2 F (36.8 C) 98 F (36.7 C)  98.1 F (36.7 C)  TempSrc: Oral Oral  Oral  Resp: 16 18 16 20   Height:      Weight:    123.8 kg (272 lb 14.9 oz)  SpO2: 92% 94% 93% 97%   Weight change: 2.1 kg (4 lb 10.1 oz)  Intake/Output Summary (Last 24 hours) at 01/15/14 0816 Last data filed at 01/14/14 1700  Gross per 24 hour  Intake    300 ml  Output      0 ml  Net    300 ml   General: sitting up in chair HEENT: + left facial droop, tongue deviates to the left Cardiac: RRR, no rubs, murmurs or gallops Pulm: clear to auscultation bilaterally, moving normal volumes of air Abd: soft, nontender, nondistended, BS present Ext: warm and well perfused, no pedal edema Neuro: alert and oriented X3.  Motor strength still 0/5 in left upper and lower extremities.  Tongue deviates to the left.  Lab Results: Basic Metabolic Panel:  Recent Labs Lab 01/10/14 0541 01/13/14 0426 01/15/14 0456  NA 135* 140 140  K 3.9 3.8 3.7  CL 98 101 102  CO2 25 23 23   GLUCOSE 247* 183* 203*  BUN 6 10 10   CREATININE 0.76 0.91 0.79  CALCIUM 8.8 9.1 9.2  MG 1.6 1.9  --    Liver Function Tests:  Recent Labs Lab 01/09/14 1109  AST 15  ALT 15  ALKPHOS 80  BILITOT 0.6  PROT 7.2  ALBUMIN 3.6   CBC:  Recent Labs Lab 01/09/14 1109 01/09/14 1135  WBC 10.4  --   HGB 12.8 13.9  HCT 37.7 41.0  MCV 82.7  --   PLT 213  --    Cardiac Enzymes:  Recent Labs Lab 01/09/14 1940 01/10/14 0010  TROPONINI <0.30 <0.30   CBG:  Recent Labs Lab 01/13/14 2101 01/14/14 0708 01/14/14 1246 01/14/14 1650 01/14/14 2211  01/15/14 0650  GLUCAP 196* 176* 203* 175* 241* 184*   Coagulation:  Recent Labs Lab 01/09/14 1940  LABPROT 13.5  INR 1.05   Urine Drug Screen: Drugs of Abuse     Component Value Date/Time   LABOPIA NONE DETECTED 01/09/2014 2030   COCAINSCRNUR NONE DETECTED 01/09/2014 2030   LABBENZ NONE DETECTED 01/09/2014 2030   AMPHETMU NONE DETECTED 01/09/2014 2030   THCU NONE DETECTED 01/09/2014 2030   LABBARB NONE DETECTED 01/09/2014 2030    Urinalysis:  Recent Labs Lab 01/09/14 1126  COLORURINE YELLOW  LABSPEC 1.023  PHURINE 6.0  GLUCOSEU >1000*  HGBUR NEGATIVE  BILIRUBINUR NEGATIVE  KETONESUR NEGATIVE  PROTEINUR NEGATIVE  UROBILINOGEN 0.2  NITRITE POSITIVE*  LEUKOCYTESUR MODERATE*    Micro Results: Recent Results (from the past 240 hour(s))  URINE CULTURE     Status: None   Collection Time    01/09/14 11:26 AM      Result Value Range Status   Specimen Description URINE, RANDOM   Final   Special Requests NONE   Final   Culture  Setup Time  Final   Value: 01/09/2014 20:31     Performed at [facility] Count     Final   Value: >=100,000 COLONIES/ML     Performed at [facility]   Culture     Final   Value: ESCHERICHIA COLI     Performed at [facility]   Report Status 01/11/2014 FINAL   Final   Organism ID, Bacteria ESCHERICHIA COLI   Final    Studies/Results: No results found. Medications: I have reviewed the patient's current medications. Scheduled Meds: . aspirin EC  325 mg Oral Daily  . carbamazepine  200 mg Oral BID  . digoxin  0.125 mg Oral Daily  . heparin  5,000 Units Subcutaneous Q8H  . insulin aspart  0-9 Units Subcutaneous TID WC  . insulin glargine  14 Units Subcutaneous QHS  . metoprolol succinate  25 mg Oral BID  . pantoprazole  40 mg Oral Daily  . simvastatin  40 mg Oral q1800   Continuous Infusions:   PRN Meds:.acetaminophen, albuterol  Assessment/Plan: 48 year old woman with PMH of HTN, DM type 2,  systolic HF with R MCA CVA.  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 CTA head and neck revealed R MCA infarct with occluded MCA branch leading to this area, neck arteries patent; no hemorrhage.  TEE does not reveal embolic source. Pacemaker can be interrogated as an outpatient to assess for potential Afib.  She is stable for discharge to CIR.  Hospital follow-up is scheduled in [facility] on March 3.  She has follow-up scheduled with Dr. Pearlean Brownie (Neurology) on Monday, Apr 29, 2014 at 2:15PM. - continue ASA 325 at discharge - slowly add back antihypertensives as BP tolerates  #HTN:  Toprol 25mg  BID was added back.  Holding amlodipine and hydralazine given normotension.  # Type 2 DM, uncontrolled: A1c 10.7 (January 2015). Patient takes Lantus 27U qHS and aspart 12-13U TID w/ meals at home.  BG have been elevated high 100s -low 200s on 14 units qHS and SSI. Plan to increase to 18 units qHS at discharge and resume Novolog at reduced dose:  2-3 units TID with meals. -continue tegretol for neuropathy   # Chronic systolic CHF: Stable.  2D ECHO on 11/2013 showed EF 30% w/ diffuse hypokinesis. 01/2014 TEE with EF 35-40%.  Digoxin and Toprol were continue during admission. - resume Lasix, spironolactone, imdur at discharge - restart other medications as BP permits  # Hx of CAD: stable.  Continue ASA and Zocor.     # Hypokalemia: Resolved: K 3.7 on day of discharge.  # UTI: Increased frequency and + UA on admission.  Frequency may have been due to glucosuria (> 1000).  She received 3 days of IV Rocephin for uncomplicated UTI.  #OSA: Continue CPAP   # VTE: heparin   # Diet: Dysphagia 1 diet per SLP  Code status: full  Dispo: She is clinically stable and ready for discharge to CIR.  Appropriate follow-up has been scheduled.    The patient does have a current PCP 02/2014, MD) and does not need an [facility] [employer] hospital follow-up  appointment after discharge.  The patient does not have transportation limitations that hinder transportation to clinic appointments.  .Services Needed at time of discharge: Y = Yes, Blank = No PT:   Y  OT:   Y  RN:   Y  Equipment:   Other:  CIR    LOS: 6 days   Evelena Peat, DO 01/15/2014, 8:16 AM

## 2014-01-15 NOTE — Discharge Instructions (Signed)
1. You are being discharged to inpatient rehab.  Please follow-up with Dr. Lars Masson February 12, 2014.   2. Please take all medications as prescribed.    3. If you have worsening of your symptoms or new symptoms arise, please call the clinic ([phone]), or go to the ER immediately if symptoms are severe.

## 2014-01-15 NOTE — Interval H&P Note (Signed)
[name] was admitted today to Inpatient Rehabilitation with the diagnosis of right CVA.  The patient's history has been reviewed, patient examined, and there is no change in status.  Patient continues to be appropriate for intensive inpatient rehabilitation.  I have reviewed the patient's chart and labs.  Questions were answered to the patient's satisfaction.  [name] T 01/15/2014, 10:38 PM

## 2014-01-15 NOTE — PMR Pre-admission (Signed)
PMR Admission Coordinator Pre-Admission Assessment  Patient: [name] is an 48 y.o., female MRN: [id number] DOB: 1966-08-02 Height: 5\' 9"  (175.3 cm) Weight: 123.8 kg (272 lb 14.9 oz)              Insurance Information HMO:      PPO:       PCP:       IPA:       80/20:       OTHER:   PRIMARY: Medicaid [place] access      Policy#: r      Subscriber: [id number] CM Name:        Phone#:       Fax#:   Pre-Cert#:        Employer: Not employed/Disabled Benefits:  Phone #: [phone]     Name: Automated Eff. Date: 01/15/14 is eligible     Deduct:        Out of Pocket Max:        Life Max:   CIR:        SNF:   Outpatient:       Co-Pay:   Home Health:        Co-Pay:   DME:       Co-Pay:   Providers:    Emergency Contact Information Contact Information   Name Relation Home Work Mobile   [name] Daughter [phone]     [phone]  [phone]   [phone]     [name], [name]   [phone]     Current Medical History  Patient Admitting Diagnosis: R MCA infarct   History of Present Illness: A 48 y.o. right-handed female with history of uncontrolled hypertension, diabetes mellitus peripheral neuropathy, morbid obesity, nonischemic cardiomyopathy, chronic diastolic heart failure, CAD status post pacemaker placement and chronic back pain followed at outpatient rehabilitation services. Admitted 01/09/2014 with left facial weakness and slurred speech. Patient with previous admission 10/07/2013 with similar symptoms workup negative as per neurology services. Cranial CT scan consistent with possible right middle cerebral artery territory infarct. MRI not completed secondary to pacemaker. Patient did not receive TPA. CTA view of the head negative for hemorrhage did show occluded branch MCA vessel. CTA of the neck was negative.. Echocardiogram with ejection fraction of 40% no wall motion abnormalities.. TEE showed  moderately depressed LVEF without thrombus or vegetation. . Currently maintained on aspirin for CVA prophylaxis. Subcutaneous heparin for DVT prophylaxis. Diet has been advanced to a regular consistency. Physical and occupational therapy evaluations completed 01/10/2014 with recommendations for physical medicine rehabilitation consult. Patient will be admitted for comprehensive rehabilitation program.      Total: 13=NIH  Past Medical History  Past Medical History  Diagnosis Date  . Hypertension     Poorly controlled. Has had HTN since age 37. Angioedema with ACEI.  24 Hr urine and renal arterial dopplers ordered . . . Never done  . Type II or unspecified type diabetes mellitus without mention of complication, uncontrolled DX: 2002  . NICM (nonischemic cardiomyopathy)     EF 45-50% in 8/12, cath 6/12 showed normal coronaries, EF 50-55% by LV gram  . Morbid obesity   . Allergic rhinitis   . HLD (hyperlipidemia)   . OSA on CPAP     sleep study in 8/12 showed moderate to severe OSA requiring CPAP  . Chronic lower back pain     secondary to DJD, obsetiy, hip problems. Followed by Dr. 10/12 (pain management)  . 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 Chronic diastolic heart failure      Primarily diastolic CHF: Likely due to uncontrolled HTN. Last echo (8/12) with EF 45-50%, mild to moderate LVH with some asymmetric septal hypertrophy, RV normal size and systolic function. EF 50-55% by LV-gram in 6/12.   . Degeneration of lumbar or lumbosacral intervertebral disc   . Polyneuropathy in diabetes(357.2)   . Thoracic or lumbosacral neuritis or radiculitis, unspecified   . Calcifying tendinitis of shoulder   . LBBB (left bundle branch block)   . Chronic combined systolic and diastolic CHF (congestive heart failure)     EF 40-45% by echo 12/06/2012  . Coronary artery disease     questionable. LHC 05/2011 showing normal coronaries // Followed at [facility], Dr.  Shirlee Latch  . GERD (gastroesophageal reflux disease)     Family History  family history includes Breast cancer in her maternal grandmother and paternal aunt; Congestive Heart Failure in her mother; Diabetes in her brother and father; Heart disease in her paternal grandmother; Heart disease (age of onset: 77) in her brother; Heart disease (age of onset: 52) in her mother; Heart disease (age of onset: 102) in her paternal grandfather; Heart disease (age of onset: 31) in her father; Kidney disease in her mother; Stroke in her paternal grandfather.  Prior Rehab/Hospitalizations:  None   Current Medications  Current facility-administered medications:acetaminophen (TYLENOL) tablet 650 mg, 650 mg, Oral, Q6H PRN, Windell Hummingbird, MD, 650 mg at 01/13/14 2248;  albuterol (PROVENTIL) (2.5 MG/3ML) 0.083% nebulizer solution 2.5 mg, 2.5 mg, Nebulization, Q6H PRN, Farley Ly, MD;  aspirin EC tablet 325 mg, 325 mg, Oral, Daily, Windell Hummingbird, MD, 325 mg at 01/15/14 1050 carbamazepine (TEGRETOL) tablet 200 mg, 200 mg, Oral, BID, Darden Palmer, MD, 200 mg at 01/15/14 1050;  digoxin (LANOXIN) tablet 0.125 mg, 0.125 mg, Oral, Daily, Darden Palmer, MD, 0.125 mg at 01/15/14 1050;  heparin injection 5,000 Units, 5,000 Units, Subcutaneous, Q8H, Darden Palmer, MD, 5,000 Units at 01/15/14 0743;  insulin aspart (novoLOG) injection 0-9 Units, 0-9 Units, Subcutaneous, TID WC, Darden Palmer, MD, 3 Units at 01/15/14 1213 insulin glargine (LANTUS) injection 14 Units, 14 Units, Subcutaneous, QHS, Genelle Gather, MD, 14 Units at 01/14/14 2206;  metoprolol succinate (TOPROL-XL) 24 hr tablet 25 mg, 25 mg, Oral, BID, Genelle Gather, MD, 25 mg at 01/15/14 1050;  pantoprazole (PROTONIX) EC tablet 40 mg, 40 mg, Oral, Daily, Darden Palmer, MD, 40 mg at 01/15/14 1049;  simvastatin (ZOCOR) tablet 40 mg, 40 mg, Oral, q1800, Darden Palmer, MD, 40 mg at 01/14/14 1704  Patients Current Diet: Cardiac  Precautions /  Restrictions Precautions Precautions: Fall Restrictions Weight Bearing Restrictions: No   Prior Activity Level Limited Community (1-2x/wk): Went out 1-2 X a week  Journalist, newspaper / Corporate investment banker Devices/Equipment: None  Prior Functional Level Prior Function Level of Independence: Independent  Current Functional Level Cognition  Overall Cognitive Status: Within Functional Limits for tasks assessed Orientation Level: Oriented X4    Extremity Assessment (includes Sensation/Coordination)          ADLs  Eating/Feeding: Simulated;Minimal assistance;Set up;Other (comment) (VC's to clear L side of mouth/sweep) Where Assessed - Eating/Feeding: Edge of bed Grooming: Performed;Wash/dry hands;Wash/dry face;Teeth care;Moderate assistance (Sitting EOB) Where Assessed - Grooming: Unsupported sitting Upper Body Bathing: Performed;Chest;Left arm;Abdomen;Moderate assistance (Sitting EOB) Where Assessed - Upper Body Bathing: Unsupported sitting Lower Body Bathing: Simulated;+1 Total assistance Where Assessed - Lower Body Bathing: Supine, head  of bed flat;Rolling right and/or left Upper Body Dressing: Performed;+1 Total assistance Where Assessed - Upper Body Dressing: Unsupported sitting Lower Body Dressing: Simulated;+2 Total assistance Where Assessed - Lower Body Dressing: Supported sit to stand;Other (comment) (Block L LE) Toilet Transfer: Simulated;+2 Total assistance Toilet Transfer Method: Stand pivot Acupuncturist: Extra wide drop arm bedside commode (Needs Extra wide drop arm commode in room) Toileting - Clothing Manipulation and Hygiene: Simulated;+2 Total assistance Where Assessed - Toileting Clothing Manipulation and Hygiene: Rolling right and/or left Tub/Shower Transfer Method: Not assessed Transfers/Ambulation Related to ADLs: Pt performed bed mobility w/ Mod-max assist x1 today. ADL Comments: Pt was educated in role of OT: She is L hand dominant  w/ L side flacid at this time, c/o paresthesias/numbness throughout left side. Pt participated in ADL retraiing session with Mod A sitting EOB. Occasional vc's for shifting weight to midline. Discussed ADL's & recommendation of CIR to assist w/ maximizing I w/ ADL's/transfers and pt verbalized agreement w/ this.    Mobility  Overal bed mobility: Needs Assistance Bed Mobility: Supine to Sit Sidelying to sit: Mod assist Supine to sit: Mod assist;HOB elevated Sit to sidelying: Mod assist General bed mobility comments: Pt utilizes bed rail with R UE to A with bringing her trunk up to sitting.  A with L LE and L side.      Transfers  Overall transfer level: Needs assistance Equipment used: 2 person hand held assist Transfers: Sit to/from [employer] Sit to Stand: Max assist;+2 physical assistance Stand pivot transfers: Max assist;+2 physical assistance General transfer comment: cues and facilitation for positioning LEs, maintaining midline balance with coming to stand, blocking R LE, A moving R LE through pivot, cues for completing pivot prior to sitting as pt tends to try to sit prematurely.      Ambulation / Gait / Stairs / Wheelchair Mobility  Not tried   Posture / Balance      Special needs/care consideration BiPAP/CPAP Yes, uses at home per daughter CPM No Continuous Drip IV No Dialysis No        Life Vest No Oxygen No Special Bed No Trach Size No Wound Vac (area) No     Skin WDL                           Bowel mgmt: Last BM 01/08/14 Bladder mgmt: Voiding WDL Diabetic mgmt Yes, on insulin at home    Previous Home Environment Home Care Services: No  Discharge Living Setting Plans for Discharge Living Setting: Patient's home;Alone;House (Family plans to care for patient in her home.) Type of Home at Discharge: House Discharge Home Layout: One level Discharge Home Access: Stairs to enter Entrance Stairs-Number of Steps: 2 steps up to porch and 1 step into  house Does the patient have any problems obtaining your medications?: No  Social/Family/Support Systems Patient Roles: Parent (Has adult son and daughter.) Contact Information: Andrew Cislo - daughter and darnelle debona - son Anticipated Caregiver: Daughter, son and family Anticipated Caregiver's Contact Information: Jerl Santos - daughter [phone] Ability/Limitations of Caregiver: Dtr, son, and family plan to provide care to patient in her home. Caregiver Availability: 24/7 Discharge Plan Discussed with Primary Caregiver: Yes Is Caregiver In Agreement with Plan?: Yes Does Caregiver/Family have Issues with Lodging/Transportation while Pt is in Rehab?: No  Goals/Additional Needs Patient/Family Goal for Rehab: PT/OT min/mod assist, ST supervision goals Expected length of stay: 20-30 days Cultural Considerations: None Dietary Needs: Heart  diet, thin liquids Equipment Needs: TBD Pt/Family Agrees to Admission and willing to participate: Yes Program Orientation Provided & Reviewed with Pt/Caregiver Including Roles  & Responsibilities: Yes  Decrease burden of Care through IP rehab admission:  N/A  Possible need for SNF placement upon discharge: Yes, if family do not feel that they can manage functional level at the time of rehab discharge.  Patient Condition: This patient's medical and functional status has changed since the consult dated: 01/11/14 in which the Rehabilitation Physician determined and documented that the patient's condition is appropriate for intensive rehabilitative care in an inpatient rehabilitation facility. See "History of Present Illness" (above) for medical update. Functional changes are: Currently requiring max Assist +2 for stand pivot transfers.. Patient's medical and functional status update has been discussed with the Rehabilitation physician and patient remains appropriate for inpatient rehabilitation. Will admit to inpatient rehab today.  Preadmission Screen  Completed By:  Trish Mage, 01/15/2014 12:28 PM ______________________________________________________________________   Discussed status with Dr. Riley Kill on 01/15/14 at 1237 and received telephone approval for admission today.  Admission Coordinator:  Trish Mage, time1237/Date02/03/15

## 2014-01-15 NOTE — Progress Notes (Signed)
Inpatient Diabetes Program Recommendations  AACE/ADA: New Consensus Statement on Inpatient Glycemic Control (2013)  Target Ranges:  Prepandial:   less than 140 mg/dL      Peak postprandial:   less than 180 mg/dL (1-2 hours)      Critically ill patients:  140 - 180 mg/dL   Hyperglycemia  Inpatient Diabetes Program Recommendations Insulin - Basal: Increase lantus to 20 units.  Pt takes 27 units at home Correction (SSI): consider increasing Novolog to moderate scale  Insulin - Meal Coverage: Pt takes 12-13 units meal coverage at home.  Please add 3 units meal coverage while here.  Thank you, Lenor Coffin, RN, CNS, Diabetes Coordinator [phone])

## 2014-01-15 NOTE — Progress Notes (Signed)
Physical Therapy Treatment Patient Details Name: [name] MRN: [id number] DOB: [birth year] Today's Date: 01/15/2014 Time: 0835-0900 PT Time Calculation (min): 25 min  PT Assessment / Plan / Recommendation  History of Present Illness Pt presents with L sided weakness and dysarrthria.  CT reveals subtle blurring of gray-white matter differentiation in the right frontal lobe and right operculum, which may reflect evolving ischemic right MCA territory infarct.   PT Comments   Patient continues to be highly motivated and eager to progress with therapies. Pt upset about diet recommendations and not being able to eat normal food. Patient educated on why her diet was prescribed. Continue to recommend comprehensive inpatient rehab (CIR) for post-acute therapy needs.   Follow Up Recommendations  CIR     Does the patient have the potential to tolerate intense rehabilitation     Barriers to Discharge        Equipment Recommendations       Recommendations for Other Services    Frequency Min 4X/week   Progress towards PT Goals Progress towards PT goals: Progressing toward goals  Plan Current plan remains appropriate    Precautions / Restrictions Precautions Precautions: Fall   Pertinent Vitals/Pain Denied any pain   Mobility  Bed Mobility Overal bed mobility: Needs Assistance Sidelying to sit: Mod assist General bed mobility comments: Pt utilizes bed rail with R UE to A with bringing her trunk up to sitting.  A with L LE and L side.  use of chuck pad to get EOB Transfers Overall transfer level: Needs assistance Equipment used: 2 person hand held assist Sit to Stand: Max assist;+2 physical assistance Stand pivot transfers: Max assist;+2 physical assistance General transfer comment: cues and facilitation for positioning LEs, maintaining midline balance with coming to stand, blocking R LE, A moving R LE through pivot. Patient continues to sit prematuraly and required cueing to prevent  as much as possible. Patient able to push up from bed with R UE. Patient stood x2    Exercises     PT Diagnosis:    PT Problem List:   PT Treatment Interventions:     PT Goals (current goals can now be found in the care plan section)    Visit Information  Last PT Received On: 01/15/14 Assistance Needed: +2 History of Present Illness: Pt presents with L sided weakness and dysarrthria.  CT reveals subtle blurring of gray-white matter differentiation in the right frontal lobe and right operculum, which may reflect evolving ischemic right MCA territory infarct.    Subjective Data      Cognition  Cognition Arousal/Alertness: Awake/alert Behavior During Therapy: WFL for tasks assessed/performed Overall Cognitive Status: Within Functional Limits for tasks assessed    Balance  Balance Sitting balance-Leahy Scale: Fair Standing balance-Leahy Scale: Zero Standing balance comment: Continue to require A to prevent L knee buckling  End of Session PT - End of Session Equipment Utilized During Treatment: Gait belt Activity Tolerance: Patient tolerated treatment well Patient left: in chair;with call bell/phone within reach Nurse Communication: Mobility status   GP     Fredrich Birks 01/15/2014, 1:40 PM  01/15/2014 Fredrich Birks PTA [phone] pager [phone] office

## 2014-01-15 NOTE — H&P (Signed)
Physical Medicine and Rehabilitation Admission H&P    Chief Complaint  Patient presents with  . Cerebrovascular Accident  ::  Chief complaint: Weakness  HPI: [name] is a 48 y.o. right-handed female with history of uncontrolled hypertension, diabetes mellitus peripheral neuropathy, morbid obesity, nonischemic cardiomyopathy, chronic diastolic heart failure, CAD status post pacemaker placement and chronic back pain followed at outpatient rehabilitation services. Admitted 01/09/2014 with left facial weakness and slurred speech. Patient with previous admission 10/07/2013 with similar symptoms workup negative as per neurology services. Cranial CT scan consistent with possible right middle cerebral artery territory infarct. MRI not completed secondary to pacemaker. Patient did not receive TPA. CTA view of the head negative for hemorrhage did show occluded branch MCA vessel. CTA  of the neck was negative.. Echocardiogram with ejection fraction of 40% no wall motion abnormalities.. TEE showed moderately depressed LVEF without thrombus or vegetation. . Currently maintained on aspirin for CVA prophylaxis. Subcutaneous heparin for DVT prophylaxis. Diet has been advanced to a regular consistency.  Physical and occupational therapy evaluations completed 01/10/2014 with recommendations for physical medicine rehabilitation consult. Patient was admitted for comprehensive rehabilitation program   ROS Review of Systems  Respiratory: Positive for shortness of breath.  Cardiovascular: Positive for palpitations.  Gastrointestinal: Positive for constipation.  Musculoskeletal: Positive for back pain, joint pain and myalgias.  All other systems reviewed and are negative  Past Medical History  Diagnosis Date  . Hypertension     Poorly controlled. Has had HTN since age 4. Angioedema with ACEI.  24 Hr urine and renal arterial dopplers ordered . . . Never done  . Type II or unspecified type diabetes  mellitus without mention of complication, uncontrolled DX: 2002  . NICM (nonischemic cardiomyopathy)     EF 45-50% in 8/12, cath 6/12 showed normal coronaries, EF 50-55% by LV gram  . Morbid obesity   . Allergic rhinitis   . HLD (hyperlipidemia)   . OSA on CPAP     sleep study in 8/12 showed moderate to severe OSA requiring CPAP  . Chronic lower back pain     secondary to DJD, obsetiy, hip problems. Followed by Dr. Oval Linsey (pain management)  . 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 Chronic diastolic heart failure      Primarily diastolic CHF: Likely due to uncontrolled HTN. Last echo (8/12) with EF 45-50%, mild to moderate LVH with some asymmetric septal hypertrophy, RV normal size and systolic function. EF 50-55% by LV-gram in 6/12.   . Degeneration of lumbar or lumbosacral intervertebral disc   . Polyneuropathy in diabetes(357.2)   . Thoracic or lumbosacral neuritis or radiculitis, unspecified   . Calcifying tendinitis of shoulder   . LBBB (left bundle branch block)   . Chronic combined systolic and diastolic CHF (congestive heart failure)     EF 40-45% by echo 12/06/2012  . Coronary artery disease     questionable. LHC 05/2011 showing normal coronaries // Followed at [facility], Dr. Aundra Dubin  . GERD (gastroesophageal reflux disease)    Past Surgical History  Procedure Laterality Date  . Breast biopsies Bilateral 2011    patient reports benign results  . Tubal ligation  05/31/1985  . Hand surgery Left   . Shoulder surgery Right   . Dental surgery      tumors removed   . Cardiac catheterization  05/2011  . Pacemaker/defibrillator placed  08/22/13 Left 08/22/13  . Tee without cardioversion N/A 01/14/2014  Procedure: TRANSESOPHAGEAL ECHOCARDIOGRAM (TEE);  Surgeon: Sanda Klein, MD;  Location: [facility] ENDOSCOPY;  Service: Cardiovascular;  Laterality: N/A;   Family History  Problem Relation Age of Onset  . Heart disease Mother 46    Died of MI at age 68  yo  . Heart disease Paternal Grandmother     requiring pacemaker.  [name] Heart disease Paternal Grandfather 13    Died of MI at possibly age 21-53yo  . Stroke Paternal Grandfather   . Heart disease Father 24    MI age 9yo requiring stenting  . Kidney disease Mother     requiring dialysis  . Congestive Heart Failure Mother   . Diabetes Father   . Diabetes Brother   . Heart disease Brother 70    MI at age 6 years old  . Breast cancer Paternal Aunt   . Breast cancer Maternal Grandmother    Social History:  reports that she has never smoked. She has never used smokeless tobacco. She reports that she does not drink alcohol or use illicit drugs. Allergies:  Allergies  Allergen Reactions  . Clindamycin Anaphylaxis and Swelling  . Enalapril Maleate Swelling  . Lisinopril Swelling   Medications Prior to Admission  Medication Sig Dispense Refill  . amLODipine (NORVASC) 5 MG tablet Take 1 tablet (5 mg total) by mouth daily.  30 tablet  3  . aspirin EC 81 MG tablet Take 81 mg by mouth daily.       . carbamazepine (TEGRETOL) 200 MG tablet Take 1 tablet (200 mg total) by mouth 2 (two) times daily.  60 tablet  5  . cyclobenzaprine (FLEXERIL) 10 MG tablet Take 1 tablet (10 mg total) by mouth 3 (three) times daily as needed for muscle spasms.  30 tablet  2  . diclofenac sodium (VOLTAREN) 1 % GEL Apply 2 g topically 4 (four) times daily as needed (for pain).  3 Tube  2  . digoxin (LANOXIN) 0.125 MG tablet Take 1 tablet (0.125 mg total) by mouth daily.  30 tablet  11  . fluconazole (DIFLUCAN) 150 MG tablet Take 1 tablet (150 mg total) by mouth every 3 (three) days.  3 tablet  0  . furosemide (LASIX) 40 MG tablet Take 1 tablet (40 mg total) by mouth 2 (two) times daily.  60 tablet  5  . hydrALAZINE (APRESOLINE) 100 MG tablet Take 1 tablet (100 mg total) by mouth 3 (three) times daily.  90 tablet  10  . Insulin Glargine (LANTUS SOLOSTAR) 100 UNIT/ML Solostar Pen Inject 27 Units into the skin at  bedtime.  5 pen  11  . isosorbide mononitrate (IMDUR) 60 MG 24 hr tablet Take 1 tablet (60 mg total) by mouth daily.  30 tablet  11  . metoprolol succinate (TOPROL-XL) 50 MG 24 hr tablet Take 1 tablet (50 mg total) by mouth 2 (two) times daily.  60 tablet  3  . pantoprazole (PROTONIX) 40 MG tablet Take 1 tablet (40 mg total) by mouth daily.  30 tablet  5  . spironolactone (ALDACTONE) 25 MG tablet Take 1 tablet (25 mg total) by mouth daily.  90 tablet  3  . albuterol (PROVENTIL HFA;VENTOLIN HFA) 108 (90 BASE) MCG/ACT inhaler Inhale 2 puffs into the lungs every 6 (six) hours as needed for wheezing or shortness of breath.      . insulin aspart (NOVOLOG FLEXPEN) 100 UNIT/ML injection Inject 12-13 Units into the skin 3 (three) times daily before meals.  1 vial  11  .  oxyCODONE-acetaminophen (PERCOCET) 10-325 MG per tablet Take 1 tablet by mouth every 8 (eight) hours as needed for pain. Must last 30 days. For pain  70 tablet  0  . simvastatin (ZOCOR) 40 MG tablet Take 40 mg by mouth daily at 6 PM.        Home: Home Living Family/patient expects to be discharged to:: Inpatient rehab   Functional History:    Functional Status:  Mobility:          ADL: ADL Eating/Feeding: Simulated;Minimal assistance;Set up;Other (comment) (VC's to clear L side of mouth/sweep) Where Assessed - Eating/Feeding: Edge of bed Grooming: Performed;Wash/dry hands;Wash/dry face;Teeth care;Moderate assistance (Sitting EOB) Where Assessed - Grooming: Unsupported sitting Upper Body Bathing: Performed;Chest;Left arm;Abdomen;Moderate assistance (Sitting EOB) Where Assessed - Upper Body Bathing: Unsupported sitting Lower Body Bathing: Simulated;+1 Total assistance Where Assessed - Lower Body Bathing: Supine, head of bed flat;Rolling right and/or left Upper Body Dressing: Performed;+1 Total assistance Where Assessed - Upper Body Dressing: Unsupported sitting Lower Body Dressing: Simulated;+2 Total assistance Where  Assessed - Lower Body Dressing: Supported sit to stand;Other (comment) (Block L LE) Toilet Transfer: Simulated;+2 Total assistance Toilet Transfer Method: Stand pivot Science writer: Extra wide drop arm bedside commode (Needs Extra wide drop arm commode in room) Tub/Shower Transfer Method: Not assessed Transfers/Ambulation Related to ADLs: Pt performed bed mobility w/ Mod-max assist x1 today. ADL Comments: Pt was educated in role of OT: She is L hand dominant w/ L side flacid at this time, c/o paresthesias/numbness throughout left side. Pt participated in ADL retraiing session with Mod A sitting EOB. Occasional vc's for shifting weight to midline. Discussed ADL's & recommendation of CIR to assist w/ maximizing I w/ ADL's/transfers and pt verbalized agreement w/ this.  Cognition: Cognition Overall Cognitive Status: Within Functional Limits for tasks assessed Orientation Level: Oriented X4 Cognition Arousal/Alertness: Awake/alert Behavior During Therapy: WFL for tasks assessed/performed Overall Cognitive Status: Within Functional Limits for tasks assessed  Physical Exam: Blood pressure 139/92, pulse 90, temperature 98.1 F (36.7 C), temperature source Oral, resp. rate 20, height _0  (1.753 m), weight 123.8 kg (272 lb 14.9 oz), last menstrual period 12/21/2013, SpO2 97.00%. Physical Exam Constitutional:  47 year old African American obese female  HEENT: oral mucosa pink and moist Eyes: EOM are normal.  Pupils reactive to light  Neck: Normal range of motion. Neck supple. No thyromegaly present.  Cardiovascular:  Cardiac rate controlled without murmur, rub, gallops Respiratory: Effort normal and breath sounds normal. No respiratory distress.  GI: Soft. Bowel sounds are normal. There is no tenderness.  Neurological: She is alert today.  Speech is dysarthric but very intelligible. She is oriented x3. Patient follows simple commands. Left facial droop and tongue deviation  persistent.  Dense left HP, (0/5) proximal to distal, both LUE and LLE.  Does sense gross touch and general pain in the left face, arm and leg. Left inattention. Appears to have reasonable insight and awareness Skin: Skin is warm and dry Psych: mood fairly pleasant, she is cooperative.   Results for orders placed during the hospital encounter of 01/09/14 (from the past 48 hour(s))  GLUCOSE, CAPILLARY     Status: Abnormal   Collection Time    01/13/14 11:51 AM      Result Value Range   Glucose-Capillary 277 (*) 70 - 99 mg/dL   Comment 1 Notify RN     Comment 2 Documented in Chart    GLUCOSE, CAPILLARY     Status: Abnormal   Collection Time  01/13/14  4:51 PM      Result Value Range   Glucose-Capillary 159 (*) 70 - 99 mg/dL   Comment 1 Notify RN     Comment 2 Documented in Chart    GLUCOSE, CAPILLARY     Status: Abnormal   Collection Time    01/13/14  9:01 PM      Result Value Range   Glucose-Capillary 196 (*) 70 - 99 mg/dL   Comment 1 Documented in Chart     Comment 2 Notify RN    GLUCOSE, CAPILLARY     Status: Abnormal   Collection Time    01/14/14  7:08 AM      Result Value Range   Glucose-Capillary 176 (*) 70 - 99 mg/dL   Comment 1 Notify RN     Comment 2 Documented in Chart    GLUCOSE, CAPILLARY     Status: Abnormal   Collection Time    01/14/14 12:46 PM      Result Value Range   Glucose-Capillary 203 (*) 70 - 99 mg/dL   Comment 1 Notify RN    GLUCOSE, CAPILLARY     Status: Abnormal   Collection Time    01/14/14  4:50 PM      Result Value Range   Glucose-Capillary 175 (*) 70 - 99 mg/dL  GLUCOSE, CAPILLARY     Status: Abnormal   Collection Time    01/14/14 10:11 PM      Result Value Range   Glucose-Capillary 241 (*) 70 - 99 mg/dL   Comment 1 Notify RN     Comment 2 Documented in Chart    BASIC METABOLIC PANEL     Status: Abnormal   Collection Time    01/15/14  4:56 AM      Result Value Range   Sodium 140  137 - 147 mEq/L   Potassium 3.7  3.7 - 5.3 mEq/L    Chloride 102  96 - 112 mEq/L   CO2 23  19 - 32 mEq/L   Glucose, Bld 203 (*) 70 - 99 mg/dL   BUN 10  6 - 23 mg/dL   Creatinine, Ser 0.79  0.50 - 1.10 mg/dL   Calcium 9.2  8.4 - 10.5 mg/dL   GFR calc non Af Amer >90  >90 mL/min   GFR calc Af Amer >90  >90 mL/min   Comment: (NOTE)     The eGFR has been calculated using the CKD EPI equation.     This calculation has not been validated in all clinical situations.     eGFR's persistently <90 mL/min signify possible Chronic Kidney     Disease.  GLUCOSE, CAPILLARY     Status: Abnormal   Collection Time    01/15/14  6:50 AM      Result Value Range   Glucose-Capillary 184 (*) 70 - 99 mg/dL   Comment 1 Notify RN     Comment 2 Documented in Chart     No results found.  Post Admission Physician Evaluation: 1. Functional deficits secondary  to cardioembolic right MCA infarct. 2. Patient is admitted to receive collaborative, interdisciplinary care between the physiatrist, rehab nursing staff, and therapy team. 3. Patient's level of medical complexity and substantial therapy needs in context of that medical necessity cannot be provided at a lesser intensity of care such as a SNF. 4. Patient has experienced substantial functional loss from his/her baseline which was documented above under the "Functional History" and "Functional Status" headings.  Judging by  the patient's diagnosis, physical exam, and functional history, the patient has potential for functional progress which will result in measurable gains while on inpatient rehab.  These gains will be of substantial and practical use upon discharge  in facilitating mobility and self-care at the household level. 5. Physiatrist will provide 24 hour management of medical needs as well as oversight of the therapy plan/treatment and provide guidance as appropriate regarding the interaction of the two. 6. 24 hour rehab nursing will assist with bladder management, bowel management, safety, skin/wound care,  disease management, medication administration, pain management and patient education  and help integrate therapy concepts, techniques,education, etc. 7. PT will assess and treat for/with: Lower extremity strength, range of motion, stamina, balance, functional mobility, safety, adaptive techniques and equipment, NMR, visual spatial awareness, pt and family education.   Goals are: min to mod assist. 8. OT will assess and treat for/with: ADL's, functional mobility, safety, upper extremity strength, adaptive techniques and equipment, NMR, visual spatial awareness, pt and family education.   Goals are: min to mod assist. 9. SLP will assess and treat for/with: speech, communication, swallowing.  Goals are: mod I to supervision. 10. Case Management and Social Worker will assess and treat for psychological issues and discharge planning. 11. Team conference will be held weekly to assess progress toward goals and to determine barriers to discharge. 12. Patient will receive at least 3 hours of therapy per day at least 5 days per week. 13. ELOS: 22-28 days  14. Prognosis:  good   Medical Problem List and Plan: 1. Cardioembolic right MCA infarct 2. DVT Prophylaxis/Anticoagulation: Subcutaneous heparin. Monitor platelet counts and any signs of bleeding 3. Pain Management/chronic pain syndrome: Tegretol 200 mg twice a day for neuropathic pain. Patient also on Flexeril prior to admission but held due to some lethargy. 4. Neuropsych: This patient is capable of making decisions on her own behalf. 5. Nonischemic cardiomyopathy, CAD with pacemaker. No chest pain or shortness of breath 6. Diabetes mellitus with peripheral neuropathy. Hemoglobin A1c 10.7. Lantus insulin 14 units each bedtime. Check blood sugars a.c. and at bedtime 7. Hypertension. Toprol-XL 25 mg twice a day, Lanoxin 0.125 mg daily. Monitor with increased activity. Recent blood pressure meds held due to some hypotension we'll resume as tolerated 8.  Chronic diastolic heart failure. Monitor for any signs of fluid overload. Daily i's and o's and weights. Patient on Lasix 40 mg twice a day prior to admission. Consider resuming back as needed 9. Hyperlipidemia. Lipitor  Meredith Staggers, MD, [place] [facility]  01/15/2014

## 2014-01-16 ENCOUNTER — Inpatient Hospital Stay (HOSPITAL_COMMUNITY): Payer: Medicaid Other | Admitting: Speech Pathology

## 2014-01-16 ENCOUNTER — Inpatient Hospital Stay (HOSPITAL_COMMUNITY): Payer: Medicaid Other

## 2014-01-16 ENCOUNTER — Inpatient Hospital Stay (HOSPITAL_COMMUNITY): Payer: Medicaid Other | Admitting: Physical Therapy

## 2014-01-16 LAB — CBC WITH DIFFERENTIAL/PLATELET
BASOS PCT: 0 % (ref 0–1)
Basophils Absolute: 0 10*3/uL (ref 0.0–0.1)
EOS ABS: 0.2 10*3/uL (ref 0.0–0.7)
Eosinophils Relative: 2 % (ref 0–5)
HEMATOCRIT: 40.3 % (ref 36.0–46.0)
HEMOGLOBIN: 13 g/dL (ref 12.0–15.0)
Lymphocytes Relative: 33 % (ref 12–46)
Lymphs Abs: 3.4 10*3/uL (ref 0.7–4.0)
MCH: 27.6 pg (ref 26.0–34.0)
MCHC: 32.3 g/dL (ref 30.0–36.0)
MCV: 85.6 fL (ref 78.0–100.0)
MONO ABS: 1 10*3/uL (ref 0.1–1.0)
Monocytes Relative: 10 % (ref 3–12)
NEUTROS PCT: 55 % (ref 43–77)
Neutro Abs: 5.5 10*3/uL (ref 1.7–7.7)
Platelets: 212 10*3/uL (ref 150–400)
RBC: 4.71 MIL/uL (ref 3.87–5.11)
RDW: 13 % (ref 11.5–15.5)
WBC: 10.1 10*3/uL (ref 4.0–10.5)

## 2014-01-16 LAB — COMPREHENSIVE METABOLIC PANEL
ALBUMIN: 3.6 g/dL (ref 3.5–5.2)
ALT: 12 U/L (ref 0–35)
AST: 16 U/L (ref 0–37)
Alkaline Phosphatase: 76 U/L (ref 39–117)
BUN: 11 mg/dL (ref 6–23)
CO2: 25 mEq/L (ref 19–32)
CREATININE: 0.9 mg/dL (ref 0.50–1.10)
Calcium: 8.9 mg/dL (ref 8.4–10.5)
Chloride: 102 mEq/L (ref 96–112)
GFR calc Af Amer: 87 mL/min — ABNORMAL LOW (ref >90)
GFR calc non Af Amer: 75 mL/min — ABNORMAL LOW (ref >90)
Glucose, Bld: 177 mg/dL — ABNORMAL HIGH (ref 70–99)
Potassium: 3.9 mEq/L (ref 3.7–5.3)
Sodium: 139 mEq/L (ref 137–147)
TOTAL PROTEIN: 7.5 g/dL (ref 6.0–8.3)
Total Bilirubin: 0.4 mg/dL (ref 0.3–1.2)

## 2014-01-16 LAB — GLUCOSE, CAPILLARY
GLUCOSE-CAPILLARY: 233 mg/dL — AB (ref 70–99)
Glucose-Capillary: 168 mg/dL — ABNORMAL HIGH (ref 70–99)
Glucose-Capillary: 188 mg/dL — ABNORMAL HIGH (ref 70–99)
Glucose-Capillary: 206 mg/dL — ABNORMAL HIGH (ref 70–99)

## 2014-01-16 NOTE — Evaluation (Signed)
Speech Language Pathology Assessment and Plan  Patient Details  Name: [name] MRN: [id number] Date of Birth: November 07, 1966  SLP Diagnosis: Dysarthria;Cognitive Impairments;Dysphagia  Rehab Potential: Excellent ELOS: 21-26 days   Today's Date: 01/16/2014 Time: 2094-7096 Time Calculation (min): 55 min  Problem List:  Patient Active Problem List   Diagnosis Date Noted  . CVA (cerebral infarction) 01/12/2014  . Aphasia 01/09/2014  . Perimenopausal menorrhagia 12/20/2013  . Candida vaginitis 12/20/2013  . Pyuria 12/20/2013  . Biventricular implantable cardiac defibrillator -Medtronic 11/23/2013  . Mixed stress and urge urinary incontinence 10/11/2013  . Headache(784.0) 10/06/2013  . Precordial pain 09/25/2013  . Preventative health care 09/13/2013  . Chronic combined systolic and diastolic heart failure 28/36/6294  . Nonischemic cardiomyopathy 08/01/2013  . Goiter 08/01/2013  . LBBB (left bundle branch block) 07/19/2013  . Prolonged Q-T interval on ECG 06/06/2013  . Depression 04/09/2013  . Myofascial pain 04/02/2013  . Osteoarthrosis, localized, primary, knee, bilateral 04/14/2012  . Lumbar spondylosis 04/14/2012  . Diabetic peripheral neuropathy 04/14/2012  . Obesity 11/23/2011  . Hyperlipidemia 11/23/2011  . Type 2 diabetes mellitus, uncontrolled, with neuropathy 09/03/2011  . OSA (obstructive sleep apnea) 08/27/2011  . ESSENTIAL HYPERTENSION 08/27/2010  . CHEST PAIN, HX OF 08/27/2010   Past Medical History:  Past Medical History  Diagnosis Date  . Hypertension     Poorly controlled. Has had HTN since age 79. Angioedema with ACEI.  24 Hr urine and renal arterial dopplers ordered . . . Never done  . Type II or unspecified type diabetes mellitus without mention of complication, uncontrolled DX: 2002  . NICM (nonischemic cardiomyopathy)     EF 45-50% in 8/12, cath 6/12 showed normal coronaries, EF 50-55% by LV gram  . Morbid obesity   . Allergic rhinitis   . HLD  (hyperlipidemia)   . OSA on CPAP     sleep study in 8/12 showed moderate to severe OSA requiring CPAP  . Chronic lower back pain     secondary to DJD, obsetiy, hip problems. Followed by Dr. Oval Linsey (pain management)  . 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 Chronic diastolic heart failure      Primarily diastolic CHF: Likely due to uncontrolled HTN. Last echo (8/12) with EF 45-50%, mild to moderate LVH with some asymmetric septal hypertrophy, RV normal size and systolic function. EF 50-55% by LV-gram in 6/12.   . Degeneration of lumbar or lumbosacral intervertebral disc   . Polyneuropathy in diabetes(357.2)   . Thoracic or lumbosacral neuritis or radiculitis, unspecified   . Calcifying tendinitis of shoulder   . LBBB (left bundle branch block)   . Chronic combined systolic and diastolic CHF (congestive heart failure)     EF 40-45% by echo 12/06/2012  . Coronary artery disease     questionable. LHC 05/2011 showing normal coronaries // Followed at [facility], Dr. Aundra Dubin  . GERD (gastroesophageal reflux disease)    Past Surgical History:  Past Surgical History  Procedure Laterality Date  . Breast biopsies Bilateral 2011    patient reports benign results  . Tubal ligation  05/31/1985  . Hand surgery Left   . Shoulder surgery Right   . Dental surgery      tumors removed   . Cardiac catheterization  05/2011  . Pacemaker/defibrillator placed  08/22/13 Left 08/22/13  . Tee without cardioversion N/A 01/14/2014    Procedure: TRANSESOPHAGEAL ECHOCARDIOGRAM (TEE);  Surgeon: Sanda Klein, MD;  Location: [facility] ENDOSCOPY;  Service: Cardiovascular;  Laterality: N/A;    Assessment / Plan / Recommendation Clinical Impression Patient is a 48 y.o. right-handed female with history of uncontrolled hypertension, diabetes mellitus peripheral neuropathy, morbid obesity, nonischemic cardiomyopathy, chronic diastolic heart failure, CAD status post pacemaker placement and chronic  back pain followed at outpatient rehabilitation services. Admitted 01/09/2014 with left facial weakness and slurred speech. Patient with previous admission 10/07/2013 with similar symptoms workup negative as per neurology services. Cranial CT scan consistent with possible right middle cerebral artery territory infarct. MRI not completed secondary to pacemaker. Patient did not receive TPA. CTA view of the head negative for hemorrhage did show occluded branch MCA vessel. CTA of the neck was negative. Echocardiogram with ejection fraction of 40% no wall motion abnormalities. TEE showed moderately depressed LVEF without thrombus or vegetation. Currently maintained on aspirin for CVA prophylaxis. Subcutaneous heparin for DVT prophylaxis. Patient transferred to CIR on 01/15/2014 and administered a BSE and presents with mild oral dysphagia characterized by prolonged mastication with decreased bolus cohesion and manipulation and decreased AP transit resulting in mild oral residue that clears with a liquid wash. Recommend pt downgrade to Dys. 2 textures with thin liquids with full supervision. Pt also demonstrates mild dysarthria characterized by low vocal intensity and imprecise consonants. Pt requires supervision cues to self-monitor and correct at the sentence level. Pt also administered a cognitive-linguistic evaluation and presents with mild cognitive impairments characterized by mild left inattention and decreased anticipatory awareness. Pt would also benefit from continued diagnostic treatment of complex problem solving in regards to money and medication management. Pt would benefit from skilled SLP intervention to maximize functional communication, swallowing function and cognitive function in order to maximize pt's overall functional independence prior to discharge home.   Skilled Therapeutic Interventions          Administered a BSE and cognitive-linguistic evaluation. Please see below for details.   SLP  Assessment  Patient will need skilled Speech Lanaguage Pathology Services during CIR admission    Recommendations  Diet Recommendations: Dysphagia 2 (Fine chop);Thin liquid Liquid Administration via: Straw;Cup Medication Administration: Whole meds with liquid Supervision: Patient able to self feed;Full supervision/cueing for compensatory strategies Compensations: Slow rate;Small sips/bites;Check for pocketing;Follow solids with liquid Postural Changes and/or Swallow Maneuvers: Seated upright 90 degrees Oral Care Recommendations: Oral care before and after PO Patient destination: Home Follow up Recommendations: Outpatient SLP;Home Health SLP Equipment Recommended: None recommended by SLP    SLP Frequency 5 out of 7 days   SLP Treatment/Interventions Cueing hierarchy;Cognitive remediation/compensation;Environmental controls;Functional tasks;Speech/Language facilitation;Therapeutic Activities;Patient/family education;Dysphagia/aspiration precaution training;Internal/external aids    Pain No/Denies Pain Prior Functioning Type of Home: House  Lives With: Son;Daughter Available Help at Discharge: Available 24 hours/day Vocation: On disability  Short Term Goals: Week 1: SLP Short Term Goal 1 (Week 1): Pt will consume current diet with minimal overt s/s of aspiration with supervision verbal cues.  SLP Short Term Goal 2 (Week 1): Pt will demonstrate efficient mastication with trials of Dys. 3 textures with supervision verbal cues.  SLP Short Term Goal 3 (Week 1): Pt will demonstrate functional problem solving for complex tasks with supervision verbal cues.  SLP Short Term Goal 4 (Week 1): Pt will utilize speech intelligibility strategies at the sentence level with Mod I.  SLP Short Term Goal 5 (Week 1): Pt will perfrom lingual and labial oral-motor exercises with Min A verbal and visual cues.  SLP Short Term Goal 6 (Week 1): Pt will attend to left field of enviornment/body during functional  tasks with mod I.   See FIM for current functional status Refer to Care Plan for Long Term Goals  Recommendations for other services: Neuropsych  Discharge Criteria: Patient will be discharged from SLP if patient refuses treatment 3 consecutive times without medical reason, if treatment goals not met, if there is a change in medical status, if patient makes no progress towards goals or if patient is discharged from hospital.  The above assessment, treatment plan, treatment alternatives and goals were discussed and mutually agreed upon: by patient  Toryn Dewalt 01/16/2014, 3:36 PM

## 2014-01-16 NOTE — Progress Notes (Signed)
Pt received suppository this shift and had multiple continent BMs on bedpan afterward, some formed and some loose. Sleeping comfortably at this time. Mick Sell RN.

## 2014-01-16 NOTE — Progress Notes (Signed)
Subjective/Complaints: 48 y.o. right-handed female with history of uncontrolled hypertension, diabetes mellitus peripheral neuropathy, morbid obesity, nonischemic cardiomyopathy, chronic diastolic heart failure, CAD status post pacemaker placement and chronic back pain followed at outpatient rehabilitation services. Admitted 01/09/2014 with left facial weakness and slurred speech. Patient with previous admission 10/07/2013 with similar symptoms workup negative as per neurology services. Cranial CT scan consistent with possible right middle cerebral artery territory infarct. MRI not completed secondary to pacemaker. Patient did not receive TPA. CTA view of the head negative for hemorrhage did show occluded branch MCA vessel. CTA of the neck was negative.. Echocardiogram with ejection fraction of 40% no wall motion abnormalities.. TEE showed moderately depressed LVEF without thrombus or vegetation. . Currently maintained on aspirin for CVA prophylaxis. Subcutaneous heparin for DVT prophylaxis. Diet has been advanced to a regular consistency  Wants to get OOB  Review of Systems - Negative except back pain Objective: Vital Signs: Blood pressure 144/94, pulse 99, temperature 97.2 F (36.2 C), temperature source Oral, resp. rate 18, height 5' 9" (1.753 m), weight 124.5 kg (274 lb 7.6 oz), last menstrual period 12/21/2013, SpO2 94.00%. No results found. Results for orders placed during the hospital encounter of 01/15/14 (from the past 72 hour(s))  GLUCOSE, CAPILLARY     Status: Abnormal   Collection Time    01/15/14  8:42 PM      Result Value Range   Glucose-Capillary 187 (*) 70 - 99 mg/dL  CBC     Status: None   Collection Time    01/15/14  9:30 PM      Result Value Range   WBC 10.4  4.0 - 10.5 K/uL   RBC 4.61  3.87 - 5.11 MIL/uL   Hemoglobin 12.9  12.0 - 15.0 g/dL   HCT 39.4  36.0 - 46.0 %   MCV 85.5  78.0 - 100.0 fL   MCH 28.0  26.0 - 34.0 pg   MCHC 32.7  30.0 - 36.0 g/dL   RDW 13.1  11.5 -  15.5 %   Platelets 219  150 - 400 K/uL  CREATININE, SERUM     Status: Abnormal   Collection Time    01/15/14  9:30 PM      Result Value Range   Creatinine, Ser 0.87  0.50 - 1.10 mg/dL   GFR calc non Af Amer 78 (*) >90 mL/min   GFR calc Af Amer >90  >90 mL/min   Comment: (NOTE)     The eGFR has been calculated using the CKD EPI equation.     This calculation has not been validated in all clinical situations.     eGFR's persistently <90 mL/min signify possible Chronic Kidney     Disease.  CBC WITH DIFFERENTIAL     Status: None   Collection Time    01/16/14  5:40 AM      Result Value Range   WBC 10.1  4.0 - 10.5 K/uL   RBC 4.71  3.87 - 5.11 MIL/uL   Hemoglobin 13.0  12.0 - 15.0 g/dL   HCT 40.3  36.0 - 46.0 %   MCV 85.6  78.0 - 100.0 fL   MCH 27.6  26.0 - 34.0 pg   MCHC 32.3  30.0 - 36.0 g/dL   RDW 13.0  11.5 - 15.5 %   Platelets 212  150 - 400 K/uL   Neutrophils Relative % 55  43 - 77 %   Neutro Abs 5.5  1.7 - 7.7 K/uL   Lymphocytes Relative  33  12 - 46 %   Lymphs Abs 3.4  0.7 - 4.0 K/uL   Monocytes Relative 10  3 - 12 %   Monocytes Absolute 1.0  0.1 - 1.0 K/uL   Eosinophils Relative 2  0 - 5 %   Eosinophils Absolute 0.2  0.0 - 0.7 K/uL   Basophils Relative 0  0 - 1 %   Basophils Absolute 0.0  0.0 - 0.1 K/uL  COMPREHENSIVE METABOLIC PANEL     Status: Abnormal   Collection Time    01/16/14  5:40 AM      Result Value Range   Sodium 139  137 - 147 mEq/L   Potassium 3.9  3.7 - 5.3 mEq/L   Chloride 102  96 - 112 mEq/L   CO2 25  19 - 32 mEq/L   Glucose, Bld 177 (*) 70 - 99 mg/dL   BUN 11  6 - 23 mg/dL   Creatinine, Ser 0.90  0.50 - 1.10 mg/dL   Calcium 8.9  8.4 - 10.5 mg/dL   Total Protein 7.5  6.0 - 8.3 g/dL   Albumin 3.6  3.5 - 5.2 g/dL   AST 16  0 - 37 U/L   ALT 12  0 - 35 U/L   Alkaline Phosphatase 76  39 - 117 U/L   Total Bilirubin 0.4  0.3 - 1.2 mg/dL   GFR calc non Af Amer 75 (*) >90 mL/min   GFR calc Af Amer 87 (*) >90 mL/min   Comment: (NOTE)     The eGFR has  been calculated using the CKD EPI equation.     This calculation has not been validated in all clinical situations.     eGFR's persistently <90 mL/min signify possible Chronic Kidney     Disease.  GLUCOSE, CAPILLARY     Status: Abnormal   Collection Time    01/16/14  7:19 AM      Result Value Range   Glucose-Capillary 168 (*) 70 - 99 mg/dL   Comment 1 Notify RN       HEENT: white coating on tongue Cardio: RRR and no murmur Resp: CTA B/L and unlabored GI: BS positive and non distended Extremity:  Pulses positive and No Edema Skin:   Intact Neuro: Alert/Oriented, Abnormal Sensory absent to LT LUE and intact LLE, Abnormal Motor 0/5 in LUE and LLE and Other no evidence of L field cut Musc/Skel:  Other back pain  Gen NAD   Assessment/Plan: 1. Functional deficits secondary to Cardioembolic R MCA infarct which require 3+ hours per day of interdisciplinary therapy in a comprehensive inpatient rehab setting. Physiatrist is providing close team supervision and 24 hour management of active medical problems listed below. Physiatrist and rehab team continue to assess barriers to discharge/monitor patient progress toward functional and medical goals. FIM:                   Comprehension Comprehension Mode: Auditory Comprehension: 5-Follows basic conversation/direction: With no assist  Expression Expression Mode: Verbal Expression: 5-Expresses basic needs/ideas: With extra time/assistive device  Social Interaction Social Interaction: 5-Interacts appropriately 90% of the time - Needs monitoring or encouragement for participation or interaction.  Problem Solving Problem Solving: 5-Solves basic problems: With no assist  Memory Memory: 6-More than reasonable amt of time  Medical Problem List and Plan:  1. Cardioembolic right MCA infarct, left hemiparesis and LUE sensory def  2. DVT Prophylaxis/Anticoagulation: Subcutaneous heparin. Monitor platelet counts and any signs of  bleeding  3.  Pain Management/chronic pain syndrome: Tegretol 200 mg twice a day for neuropathic pain. Patient also on Flexeril prior to admission but held due to some lethargy.  4. Neuropsych: This patient is capable of making decisions on her own behalf.  5. Nonischemic cardiomyopathy, CAD with pacemaker, placed Sept 2014. No chest pain or shortness of breath  6. Diabetes mellitus with peripheral neuropathy. Hemoglobin A1c 10.7. Lantus insulin 14 units each bedtime. Check blood sugars a.c. and at bedtime  7. Hypertension. Toprol-XL 25 mg twice a day, Lanoxin 0.125 mg daily. Monitor with increased activity. Recent blood pressure meds held due to some hypotension we'll resume as tolerated  8. Chronic diastolic heart failure. Monitor for any signs of fluid overload. Daily i's and o's and weights. Patient on Lasix 40 mg twice a day prior to admission. Consider resuming back as needed  9. Hyperlipidemia. Lipitor   LOS (Days) 1 A FACE TO FACE EVALUATION WAS PERFORMED  KIRSTEINS,ANDREW E 01/16/2014, 7:52 AM

## 2014-01-16 NOTE — Patient Care Conference (Signed)
Inpatient RehabilitationTeam Conference and Plan of Care Update Date: 01/16/2014   Time: 11;25 AM    Patient Name: [name]      Medical Record Number: [id number]  Date of Birth: 1965-12-20 Sex: Female         Room/Bed: 4W09C/4W09C-01 Payor Info: Payor: MEDICAID [place] / Plan: MEDICAID [place] ACCESS / Product Type: *No Product type* /    Admitting Diagnosis: R MCA infarct  Admit Date/Time:  01/15/2014  6:11 PM Admission Comments: No comment available   Primary Diagnosis:  <principal problem not specified> Principal Problem: <principal problem not specified>  Patient Active Problem List   Diagnosis Date Noted  . CVA (cerebral infarction) 01/12/2014  . Aphasia 01/09/2014  . Perimenopausal menorrhagia 12/20/2013  . Candida vaginitis 12/20/2013  . Pyuria 12/20/2013  . Biventricular implantable cardiac defibrillator -Medtronic 11/23/2013  . Mixed stress and urge urinary incontinence 10/11/2013  . Headache(784.0) 10/06/2013  . Precordial pain 09/25/2013  . Preventative health care 09/13/2013  . Chronic combined systolic and diastolic heart failure 08/01/2013  . Nonischemic cardiomyopathy 08/01/2013  . Goiter 08/01/2013  . LBBB (left bundle branch block) 07/19/2013  . Prolonged Q-T interval on ECG 06/06/2013  . Depression 04/09/2013  . Myofascial pain 04/02/2013  . Osteoarthrosis, localized, primary, knee, bilateral 04/14/2012  . Lumbar spondylosis 04/14/2012  . Diabetic peripheral neuropathy 04/14/2012  . Obesity 11/23/2011  . Hyperlipidemia 11/23/2011  . Type 2 diabetes mellitus, uncontrolled, with neuropathy 09/03/2011  . OSA (obstructive sleep apnea) 08/27/2011  . ESSENTIAL HYPERTENSION 08/27/2010  . CHEST PAIN, HX OF 08/27/2010    Expected Discharge Date: Expected Discharge Date: 02/05/14  Team Members Present: Physician leading conference: Dr. Claudette Laws Social Worker Present: Staci Acosta, LCSW;Becky Ghada Abbett, LCSW Nurse Present: Other (comment) Ronney Lion) PT Present: Wanda Plump, PT;Other (comment) Anson Fret) OT Present: Scherrie November, OT;Kris Jacklynn Lewis, OT SLP Present: Maxcine Ham, SLP PPS Coordinator present : Tora Duck, RN, CRRN     Current Status/Progress Goal Weekly Team Focus  Medical   dizziness, BPs elevated, morbid obesity, chronic pain  upgrade to min/mod level  adjust meds, assess pain with PT/OT   Bowel/Bladder   incontinent of B&B d/t urgency  minimal assist B&B  timed toilet every 3 hours and after meals   Swallow/Nutrition/ Hydration     new eval        ADL's   total assist LB dressing, max assist UB dressing and bathing, total assist +2 transfers; shower transfer unsafe at this time  mod assist toilet task and transfers, min assist bathing and dressing   sitting balance, functional transfers, L NMR, activity tolerance, bed mobility, strengthening   Mobility   Max A to +2A for bed mobility; +2A for transfers; max A with w/c mobility; gait, stairs unsafe at this time  Min A  Bed mobility, functional transfers, postural stability in sitting, w/c mobility, initiate standing/gait if safe   Communication    new eval        Safety/Cognition/ Behavioral Observations    No safety concerns        Pain   History of chronic back pain  Assess pain q shift, pre-medicate prior to therapy session if needed  Pain level less than 3   Skin   No skin breakdown/infection noted  Skin clean,dry, and intact with minimal assist  Monitor skin q shift,and prn      *See Care Plan and progress notes for long and short-term goals.  Barriers to Discharge: ? caregiver, current heavy  assist level    Possible Resolutions to Barriers:  cont rehab    Discharge Planning/Teaching Needs:    Home with daughter and son assisting with her care.  Daughter works days, son not employed right now/     Team Discussion:  Goals-min/mod level.  Sister died last Apr 15, 2023 and cousin died today in another state.  Have Neuro-psych see.   Chronic pain from back issues.  First day  Revisions to Treatment Plan:  New eval   Continued Need for Acute Rehabilitation Level of Care: The patient requires daily medical management by a physician with specialized training in physical medicine and rehabilitation for the following conditions: Daily direction of a multidisciplinary physical rehabilitation program to ensure safe treatment while eliciting the highest outcome that is of practical value to the patient.: Yes Daily medical management of patient stability for increased activity during participation in an intensive rehabilitation regime.: Yes Daily analysis of laboratory values and/or radiology reports with any subsequent need for medication adjustment of medical intervention for : Neurological problems;Other;Cardiac problems  Lucy Chris 01/16/2014, 4:06 PM

## 2014-01-16 NOTE — Evaluation (Signed)
Occupational Therapy Assessment and Plan  Patient Details  Name: [name] MRN: [id number] Date of Birth: [birth year]  OT Diagnosis: cognitive deficits, disturbance of vision, flaccid hemiplegia and hemiparesis, hemiplegia affecting dominant side and muscle weakness (generalized) Rehab Potential: Rehab Potential: Good ELOS: 21-26 days   Today's Date: 01/16/2014 Time: 0732-0830 Time Calculation (min): 58 min  Problem List:  Patient Active Problem List   Diagnosis Date Noted  . CVA (cerebral infarction) 01/12/2014  . Aphasia 01/09/2014  . Perimenopausal menorrhagia 12/20/2013  . Candida vaginitis 12/20/2013  . Pyuria 12/20/2013  . Biventricular implantable cardiac defibrillator -[employer] 11/23/2013  . Mixed stress and urge urinary incontinence 10/11/2013  . Headache(784.0) 10/06/2013  . Precordial pain 09/25/2013  . Preventative health care 09/13/2013  . Chronic combined systolic and diastolic heart failure 69/67/8938  . Nonischemic cardiomyopathy 08/01/2013  . Goiter 08/01/2013  . LBBB (left bundle branch block) 07/19/2013  . Prolonged Q-T interval on ECG 06/06/2013  . Depression 04/09/2013  . Myofascial pain 04/02/2013  . Osteoarthrosis, localized, primary, knee, bilateral 04/14/2012  . Lumbar spondylosis 04/14/2012  . Diabetic peripheral neuropathy 04/14/2012  . Obesity 11/23/2011  . Hyperlipidemia 11/23/2011  . Type 2 diabetes mellitus, uncontrolled, with neuropathy 09/03/2011  . OSA (obstructive sleep apnea) 08/27/2011  . ESSENTIAL HYPERTENSION 08/27/2010  . CHEST PAIN, HX OF 08/27/2010    Past Medical History:  Past Medical History  Diagnosis Date  . Hypertension     Poorly controlled. Has had HTN since age 51. Angioedema with ACEI.  24 Hr urine and renal arterial dopplers ordered . . . Never done  . Type II or unspecified type diabetes mellitus without mention of complication, uncontrolled DX: 2002  . NICM (nonischemic cardiomyopathy)     EF 45-50% in  8/12, cath 6/12 showed normal coronaries, EF 50-55% by LV gram  . Morbid obesity   . Allergic rhinitis   . HLD (hyperlipidemia)   . OSA on CPAP     sleep study in 8/12 showed moderate to severe OSA requiring CPAP  . Chronic lower back pain     secondary to DJD, obsetiy, hip problems. Followed by Dr. Oval Linsey (pain management)  . 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 Chronic diastolic heart failure      Primarily diastolic CHF: Likely due to uncontrolled HTN. Last echo (8/12) with EF 45-50%, mild to moderate LVH with some asymmetric septal hypertrophy, RV normal size and systolic function. EF 50-55% by LV-gram in 6/12.   . Degeneration of lumbar or lumbosacral intervertebral disc   . Polyneuropathy in diabetes(357.2)   . Thoracic or lumbosacral neuritis or radiculitis, unspecified   . Calcifying tendinitis of shoulder   . LBBB (left bundle branch block)   . Chronic combined systolic and diastolic CHF (congestive heart failure)     EF 40-45% by echo 12/06/2012  . Coronary artery disease     questionable. LHC 05/2011 showing normal coronaries // Followed at [facility], Dr. Aundra Dubin  . GERD (gastroesophageal reflux disease)    Past Surgical History:  Past Surgical History  Procedure Laterality Date  . Breast biopsies Bilateral 2011    patient reports benign results  . Tubal ligation  05/31/1985  . Hand surgery Left   . Shoulder surgery Right   . Dental surgery      tumors removed   . Cardiac catheterization  05/2011  . Pacemaker/defibrillator placed  08/22/13 Left 08/22/13  . Tee without cardioversion N/A 01/14/2014  Procedure: TRANSESOPHAGEAL ECHOCARDIOGRAM (TEE);  Surgeon: Sanda Klein, MD;  Location: [facility] ENDOSCOPY;  Service: Cardiovascular;  Laterality: N/A;    Assessment & Plan Clinical Impression: Patient is a 48 y.o. right-handed female with history of uncontrolled hypertension, diabetes mellitus peripheral neuropathy, morbid obesity, nonischemic  cardiomyopathy, chronic diastolic heart failure, CAD status post pacemaker placement and chronic back pain followed at outpatient rehabilitation services. Admitted 01/09/2014 with left facial weakness and slurred speech. Patient with previous admission 10/07/2013 with similar symptoms workup negative as per neurology services. Cranial CT scan consistent with possible right middle cerebral artery territory infarct. MRI not completed secondary to pacemaker. Patient did not receive TPA. CTA view of the head negative for hemorrhage did show occluded branch MCA vessel. CTA of the neck was negative.. Echocardiogram with ejection fraction of 40% no wall motion abnormalities.. TEE showed moderately depressed LVEF without thrombus or vegetation. . Currently maintained on aspirin for CVA prophylaxis. Subcutaneous heparin for DVT prophylaxis. Diet has been advanced to a regular consistency.  Patient transferred to CIR on 01/15/2014 .    Patient currently requires total assist +2 functional transfers, max assist UB dressing and bathing, and total assist with LB dressing secondary to muscle weakness, decreased cardiorespiratoy endurance, unbalanced muscle activation and decreased coordination, decreased visual acuity and decreased visual perceptual skills, decreased midline orientation and decreased attention to left and decreased sitting balance, decreased standing balance, decreased postural control, hemiplegia and decreased balance strategies.  Prior to hospitalization, patient could complete BADLs with min assist bathing and tub transfers and mod I all other self-care tasks. .  Patient will benefit from skilled intervention to increase independence with basic self-care skills prior to discharge home with family and nursing care.  Anticipate patient will require moderate physical assestance and follow up home health.  OT - End of Session Activity Tolerance: Decreased this session Endurance Deficit: Yes OT  Assessment Rehab Potential: Good OT Patient demonstrates impairments in the following area(s): Balance;Cognition;Endurance;Motor;Pain;Perception;Safety;Sensory;Vision OT Basic ADL's Functional Problem(s): Grooming;Bathing;Dressing;Toileting OT Transfers Functional Problem(s): Toilet;Tub/Shower OT Additional Impairment(s): Fuctional Use of Upper Extremity OT Plan OT Intensity: Minimum of 1-2 x/day, 45 to 90 minutes OT Frequency: 5 out of 7 days OT Duration/Estimated Length of Stay: 21-26 days OT Treatment/Interventions: Balance/vestibular training;Cognitive remediation/compensation;Discharge planning;Community reintegration;DME/adaptive equipment instruction;Functional mobility training;Neuromuscular re-education;Psychosocial support;Pain management;Patient/family education;Self Care/advanced ADL retraining;Therapeutic Activities;Therapeutic Exercise;UE/LE Strength taining/ROM;UE/LE Coordination activities;Visual/perceptual remediation/compensation OT Basic Self-Care Anticipated Outcome(s): min assist bathing and UB dressing, mod assist LB dressing  OT Toileting Anticipated Outcome(s): mod assist  OT Bathroom Transfers Anticipated Outcome(s): mod assist OT Recommendation Recommendations for Other Services: Neuropsych consult Patient destination: Home Follow Up Recommendations: Home health OT Equipment Recommended: Tub/shower bench   Skilled Therapeutic Intervention Pt seen for ADL retraining with focus on bed mobility, activity tolerance and postural control in sitting. Pt received supine in bed. Required total assist supine>sit and min-supervision for sitting balance during bathing EOB. Pt relying on bed rail for support and demonstrate LOB posteriorly and laterally when unsupported during dynamic movement. Discussed hemi dressing technique requiring max assist for donning shirt. Pt becoming dizzy. Assisted with sit>supine +2 assist. BP cuff too small initially then dizziness subsided by the  time a new cuff was brought to room. Completed LB dressing via rolling in bed with total assist. Pt required total assist to roll to right and min assist to roll to left. At end of session pt left with all needs in reach.   OT Evaluation Precautions/Restrictions  Precautions Precautions: Fall;ICD/Pacemaker Precaution Comments: pacemaker; chronic back  pain Restrictions Weight Bearing Restrictions: No General   Vital Signs   Pain Pain Assessment Pain Assessment: No/denies pain Pain Score: 0-No pain Home Living/Prior Functioning Home Living Family/patient expects to be discharged to:: Inpatient rehab Available Help at Discharge: Available 24 hours/day Type of Home: House Home Access: Level entry Home Layout: One level Additional Comments: Lives with daughter and son, ages 13 and 74 years old. Daughter works PT, son doesn't work, and personal care nurse 2.5 hours/day, 7 days per week to assist with medication, cooking, cleaning, bathing/dressing due to pt h/o falling after placement of pacemaker.  Lives With: Son;Daughter Prior Function Level of Independence: Requires assistive device for independence;Needs assistance with ADLs;Needs assistance with homemaking  Able to Take Stairs?: Yes Driving: Yes Vocation: On disability Leisure: Hobbies-yes (Comment) Comments: Pt owns bedside commode and rollator. PTA, pt ambulating in home with mod I using rollator. ADL   Vision/Perception  Vision - History Baseline Vision: Wears glasses only for reading Patient Visual Report: Blurring of vision;Nausea/blurring vision with head movement Vision - Assessment Eye Alignment: Impaired (comment) Vision Assessment: Vision tested Additional Comments: eye fatigued and decreased scanning to left Perception Perception: Impaired (mild inattention to left ) Praxis Praxis: Intact  Cognition Overall Cognitive Status: Within Functional Limits for tasks assessed Orientation Level: Oriented  X4 Attention: Sustained Sustained Attention: Appears intact Memory: Appears intact Awareness: Impaired Awareness Impairment: Anticipatory impairment Safety/Judgment: Appears intact Sensation Sensation Light Touch: Impaired by gross assessment Hot/Cold: Impaired by gross assessment Proprioception: Impaired by gross assessment Additional Comments: Light touch dimished on LLE as compared with RLE.  LLE proprioception impaired. Coordination Gross Motor Movements are Fluid and Coordinated: No Fine Motor Movements are Fluid and Coordinated: No Coordination and Movement Description: Unable to assess coordination in LUE/LLE secondary to weakness. Finger Nose Finger Test: unable to assess LLE d/t weakness Motor  Motor Motor: Hemiplegia;Abnormal postural alignment and control Mobility  Bed Mobility Bed Mobility: Supine to Sit;Sit to Supine;Scooting to [facility];Sitting - Scoot to Edge of Bed Supine to Sit: 2: Max assist;HOB flat Supine to Sit Details: Verbal cues for technique;Verbal cues for sequencing;Manual facilitation for placement;Tactile cues for placement Supine to Sit Details (indicate cue type and reason): Pt able to initiate L sidelying via bending R knee, moving RUE across midline; assist/multimodal cueing for LLE movement and RUE placement. Max A to lift trunk. Sitting - Scoot to Edge of Bed: 1: +1 Total assist Sitting - Scoot to Edge of Bed: Patient Percentage: 10% Sitting - Scoot to Edge of Bed Details: Manual facilitation for weight shifting;Verbal cues for technique;Tactile cues for placement Sit to Supine: 1: +1 Total assist Sit to Supine: Patient Percentage: 30% Sit to Supine - Details: Verbal cues for technique Scooting to HOB: 1: +2 Total assist Scooting to [facility]: Patient Percentage: 20% Scooting to [facility] Details: Tactile cues for placement;Tactile cues for weight bearing;Verbal cues for technique Scooting to [facility] Details (indicate cue type and reason): Multimodal cueing for RUE  at bed rail, R LE weightbearing, technique. Pt with effective return demonstration.  Trunk/Postural Assessment  Cervical Assessment Cervical Assessment: Within Functional Limits Thoracic Assessment Thoracic Assessment: Within Functional Limits Lumbar Assessment Lumbar Assessment: Within Functional Limits Postural Control Postural Control: Deficits on evaluation Trunk Control: Seated EOB without UE support with posterior LOB x1 Righting Reactions: With posterior LOB in seated with bilat LE supported, pt exhibits hip strategy, but required mod A to recover.  Balance Balance Balance Assessed: Yes Static Sitting Balance Static Sitting - Balance Support: Right upper extremity  supported;Feet supported Static Sitting - Level of Assistance: 3: Mod assist;5: Stand by assistance;4: Min assist Static Sitting - Comment/# of Minutes: Static sitting EOB x12 minutes total. SBA with RUE support at bed rail. Without UE support, pt requires min guard to mod A secondary to posterior LOB. Dynamic Sitting Balance Dynamic Sitting - Balance Support: Feet supported;During functional activity Dynamic Sitting - Level of Assistance: 3: Mod assist;2: Max assist Dynamic Sitting - Balance Activities: Forward lean/weight shifting Sitting balance - Comments: While attempting L scooting while seated EOB, pt requires mod-max A for anterior trunk lean, manual stabilization of LLE. Extremity/Trunk Assessment RUE Assessment RUE Assessment: Within Functional Limits LUE Assessment LUE Assessment: Exceptions to WFL (Brunnstrum stage 1)  FIM:  FIM - Grooming Grooming Steps: Wash, rinse, dry face;Wash, rinse, dry hands Grooming: 3: Patient completes 2 of 4 or 3 of 5 steps FIM - Bathing Bathing Steps Patient Completed: Chest;Left Arm;Abdomen Bathing: 2: Max-Patient completes 3-4 87f 10 parts or 25-49% FIM - Upper Body Dressing/Undressing Upper body dressing/undressing steps patient completed: Put head through opening of  pull over shirt/dress Upper body dressing/undressing: 2: Max-Patient completed 25-49% of tasks FIM - Lower Body Dressing/Undressing Lower body dressing/undressing: 1: Total-Patient completed less than 25% of tasks FIM - Engineer, site Assistive Devices: Arm rests Bed/Chair Transfer: 1: Supine > Sit: Total A (helper does all/Pt. < 25%);1: Sit > Supine: Total A (helper does all/Pt. < 25%);1: Two helpers   Refer to Care Plan for Long Term Goals  Recommendations for other services: Neuropsych  Discharge Criteria: Patient will be discharged from OT if patient refuses treatment 3 consecutive times without medical reason, if treatment goals not met, if there is a change in medical status, if patient makes no progress towards goals or if patient is discharged from hospital.  The above assessment, treatment plan, treatment alternatives and goals were discussed and mutually agreed upon: by patient  Colum Colt, Quillian Quince 01/16/2014, 1:12 PM

## 2014-01-16 NOTE — Progress Notes (Signed)
Occupational Therapy Session Note  Patient Details  Name: [name] MRN: [id number] Date of Birth: [birth year]  Today's Date: 01/16/2014 Time: 1331-1400 Time Calculation (min): 29 min  Short Term Goals: Week 1:  OT Short Term Goal 1 (Week 1): Patient will complete sit<>stand during self-care task with max assist  OT Short Term Goal 2 (Week 1): Patient will complete squat pivot transfer total assist +1 OT Short Term Goal 3 (Week 1): Pt will complete UB dressing with mod assist OT Short Term Goal 4 (Week 1): Pt will complete toilet task with total assist +1  Skilled Therapeutic Interventions/Progress Updates:  Pt seen for 1:1 OT session with focus on bed mobility and functional transfers. Pt received supine in bed requesting to have brief change. Pt with incontinent episode and RN notified. Completed rolling L (min assist) and R (max assist) for hygiene and to don new brief. Completed supine>sit at max assist and min instructional cueing. Pt sitting EOB with supervision and no LOB. Completed squat pivot transfer bed>wide drop arm BSC to R with +2 assist and max cues for anterior weight shift. Completed squat pivot transfer to w/c with +2 assist and total assist +1 for repositioning in w/c. At end of session pt left sitting comfortably in w/c with all needs in reach.   Therapy Documentation Precautions:  Precautions Precautions: Fall;ICD/Pacemaker Precaution Comments: pacemaker; chronic back pain Restrictions Weight Bearing Restrictions: No General:   Vital Signs:   Pain: No report of pain during therapy session.    Other Treatments:    See FIM for current functional status  Therapy/Group: Individual Therapy  Daneil Dan 01/16/2014, 2:09 PM

## 2014-01-16 NOTE — Evaluation (Addendum)
Physical Therapy Assessment and Plan  Patient Details  Name: [name] MRN: [id number] Date of Birth: Mar 19, 1966  PT Diagnosis: Abnormal posture, Hemiplegia dominant and Impaired sensation Rehab Potential: Good ELOS: > 21 days   Today's Date: 01/16/2014 Time: 0905-1005 Time Calculation (min): 60 min  Problem List:  Patient Active Problem List   Diagnosis Date Noted  . CVA (cerebral infarction) 01/12/2014  . Aphasia 01/09/2014  . Perimenopausal menorrhagia 12/20/2013  . Candida vaginitis 12/20/2013  . Pyuria 12/20/2013  . Biventricular implantable cardiac defibrillator -[employer]tr[employer] 11/23/2013  . Mixed stress and urge urinary incontinence 10/11/2013  . Headache(784.0) 10/06/2013  . Precordial pain 09/25/2013  . Preventative health care 09/13/2013  . Chronic combined systolic and diastolic heart failure 12/45/8099  . Nonischemic cardiomyopathy 08/01/2013  . Goiter 08/01/2013  . LBBB (left bundle branch block) 07/19/2013  . Prolonged Q-T interval on ECG 06/06/2013  . Depression 04/09/2013  . Myofascial pain 04/02/2013  . Osteoarthrosis, localized, primary, knee, bilateral 04/14/2012  . Lumbar spondylosis 04/14/2012  . Diabetic peripheral neuropathy 04/14/2012  . Obesity 11/23/2011  . Hyperlipidemia 11/23/2011  . Type 2 diabetes mellitus, uncontrolled, with neuropathy 09/03/2011  . OSA (obstructive sleep apnea) 08/27/2011  . ESSENTIAL HYPERTENSION 08/27/2010  . CHEST PAIN, HX OF 08/27/2010    Past Medical History:  Past Medical History  Diagnosis Date  . Hypertension     Poorly controlled. Has had HTN since age 14. Angioedema with ACEI.  24 Hr urine and renal arterial dopplers ordered . . . Never done  . Type II or unspecified type diabetes mellitus without mention of complication, uncontrolled DX: 2002  . NICM (nonischemic cardiomyopathy)     EF 45-50% in 8/12, cath 6/12 showed normal coronaries, EF 50-55% by LV gram  . Morbid obesity   . Allergic rhinitis   . HLD  (hyperlipidemia)   . OSA on CPAP     sleep study in 8/12 showed moderate to severe OSA requiring CPAP  . Chronic lower back pain     secondary to DJD, obsetiy, hip problems. Followed by Dr. Oval Linsey (pain management)  . 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 Chronic diastolic heart failure      Primarily diastolic CHF: Likely due to uncontrolled HTN. Last echo (8/12) with EF 45-50%, mild to moderate LVH with some asymmetric septal hypertrophy, RV normal size and systolic function. EF 50-55% by LV-gram in 6/12.   . Degeneration of lumbar or lumbosacral intervertebral disc   . Polyneuropathy in diabetes(357.2)   . Thoracic or lumbosacral neuritis or radiculitis, unspecified   . Calcifying tendinitis of shoulder   . LBBB (left bundle branch block)   . Chronic combined systolic and diastolic CHF (congestive heart failure)     EF 40-45% by echo 12/06/2012  . Coronary artery disease     questionable. LHC 05/2011 showing normal coronaries // Followed at [facility], Dr. Aundra Dubin  . GERD (gastroesophageal reflux disease)    Past Surgical History:  Past Surgical History  Procedure Laterality Date  . Breast biopsies Bilateral 2011    patient reports benign results  . Tubal ligation  05/31/1985  . Hand surgery Left   . Shoulder surgery Right   . Dental surgery      tumors removed   . Cardiac catheterization  05/2011  . Pacemaker/defibrillator placed  08/22/13 Left 08/22/13  . Tee without cardioversion N/A 01/14/2014    Procedure: TRANSESOPHAGEAL ECHOCARDIOGRAM (TEE);  Surgeon: Sanda Klein, MD;  Location: [facility];  Service: Cardiovascular;  Laterality: N/A;    Assessment & Plan Clinical Impression: 48 y.o. right-handed female with history of uncontrolled hypertension, diabetes mellitus peripheral neuropathy, morbid obesity, nonischemic cardiomyopathy, chronic diastolic heart failure, CAD status post pacemaker placement and chronic back pain followed at  outpatient rehabilitation services. Admitted 01/09/2014 with left facial weakness and slurred speech. Patient with previous admission 10/07/2013 with similar symptoms workup negative as per neurology services. Cranial CT scan consistent with possible right middle cerebral artery territory infarct. MRI not completed secondary to pacemaker. Patient did not receive TPA. CTA view of the head negative for hemorrhage did show occluded branch MCA vessel. CTA of the neck was negative.. Echocardiogram with ejection fraction of 40% no wall motion abnormalities.. TEE showed moderately depressed LVEF without thrombus or vegetation. . Currently maintained on aspirin for CVA prophylaxis. Subcutaneous heparin for DVT prophylaxis. Diet has been advanced to a regular consistency Patient transferred to CIR on 01/15/2014 .   Patient currently requires total with mobility secondary to L hemiplegia, decreased cardiorespiratoy endurance, impaired timing and sequencing and unbalanced muscle activation, decreased visual acuity and decreased visual perceptual skills, decreased midline orientation and decreased attention to left and decreased sitting balance, decreased standing balance, decreased postural control, hemiplegia and decreased balance strategies.  Prior to hospitalization, patient was modified independent  with mobility and lived with Son;Daughter in a House home.  Home access is  Level entry.  Patient will benefit from skilled PT intervention to maximize safe functional mobility and minimize fall risk for planned discharge home with intermittent assist.  Anticipate patient will benefit from follow up [facility] at discharge.  PT - End of Session Activity Tolerance: Tolerates 30+ min activity with multiple rests Endurance Deficit: Yes PT Assessment Rehab Potential: Good Barriers to Discharge: Decreased caregiver support;Other (comment) (No family present on eval to confirm available support. Will follow up.) PT Patient  demonstrates impairments in the following area(s): Balance;Sensory;Endurance;Motor PT Transfers Functional Problem(s): Bed Mobility;Bed to Chair;Car;Furniture PT Locomotion Functional Problem(s): Ambulation;Wheelchair Mobility PT Plan PT Intensity: Minimum of 1-2 x/day ,45 to 90 minutes PT Frequency: 5 out of 7 days PT Duration Estimated Length of Stay: >21 days PT Transfers Anticipated Outcome(s): Min A PT Locomotion Anticipated Outcome(s): Mod A PT Recommendation Recommendations for Other Services: Neuropsych consult;Other (comment) (Per pt, 2 family members have passed away within past 2-3 days.) Follow Up Recommendations: Home health PT Patient destination: Home Equipment Recommended: To be determined;Wheelchair (measurements);Wheelchair cushion (measurements) Equipment Details: Pt will likely need w/c. Additional equipment, w/c measurements TBD  Skilled Therapeutic Intervention PT evaluation performed. See below for detailed findings. Treatment initiated. Oriented x4. Session focused on bed mobility, functional transfers, and w/c mobility. With supine>sit, pt able to initiate L sidelying via bending R knee, moving RUE across midline without cueing; assist/multimodal cueing for LLE movement and RUE placement; max A to lift trunk. Seated EOB with intermittent RUE support at bed rail; see below for detailed description of assist/cueing.  Squat pivot transfer from bed<>w/c with +2A and verbal cueing for hand placement, technique; manual facilitation of anterior weight shift, manual stabilization of LLE. Therapist explained and demonstrated w/c mobility technique. Pt attempted self-propulsion of standard w/c x20' in controlled environment with R hemi-technique and max A for steering w/c with RLE. Returned to room, where session ended. Therapist departed with pt semi-reclined in bed with 3 bed rails up, bed alarm on, and all needs within reach.   PT  Evaluation Precautions/Restrictions Precautions Precautions: Fall;ICD/Pacemaker Precaution Comments: pacemaker;  chronic back pain Restrictions Weight Bearing Restrictions: No Pain Pain Assessment Pain Assessment: No/denies pain Pain Score: 0-No pain Home Living/Prior Functioning Home Living Available Help at Discharge: Available 24 hours/day Type of Home: House Home Access: Level entry Home Layout: One level Additional Comments: Lives with daughter and son, ages 41 and 36 years old. Daughter works PT, son doesn't work, and personal care nurse 2.5 hours/day, 7 days per week to assist with medication, cooking, cleaning, bathing/dressing due to pt h/o falling after placement of pacemaker.  Lives With: Son;Daughter Prior Function Level of Independence: Requires assistive device for independence;Needs assistance with ADLs;Needs assistance with homemaking  Able to Take Stairs?: Yes Driving: Yes Vocation: On disability Leisure: Hobbies-yes (Comment) Comments: Pt owns bedside commode and rollator. PTA, pt ambulating in home with mod I using rollator. Vision/Perception  Vision - History Baseline Vision: Wears glasses only for reading Patient Visual Report: Blurring of vision;Nausea/blurring vision with head movement Vision - Assessment Eye Alignment: Impaired (comment) Vision Assessment: Vision tested Additional Comments: eye fatigued and decreased scanning to left Perception Perception: Impaired (mild inattention to left ) Praxis Praxis: Intact  Cognition Overall Cognitive Status: Impaired/Different from baseline Arousal/Alertness: Awake/alert Orientation Level: Oriented X4 Attention: Selective Sustained Attention: Appears intact Selective Attention: Appears intact Memory: Appears intact Awareness: Impaired Awareness Impairment: Anticipatory impairment Problem Solving: Impaired Problem Solving Impairment: Functional basic Safety/Judgment: Appears  intact Sensation Sensation Light Touch: Impaired by gross assessment Hot/Cold: Impaired by gross assessment Proprioception: Impaired by gross assessment Additional Comments: Light touch dimished on LLE as compared with RLE.  LLE proprioception impaired. Coordination Gross Motor Movements are Fluid and Coordinated: Not tested Coordination and Movement Description: Unable to assess coordination in LUE/LLE secondary to weakness. Motor  Motor Motor: Hemiplegia;Abnormal postural alignment and control  Mobility Bed Mobility Bed Mobility: Supine to Sit;Sit to Supine;Scooting to [facility];Sitting - Scoot to Edge of Bed Supine to Sit: 2: Max assist;HOB flat Supine to Sit Details: Verbal cues for technique;Verbal cues for sequencing;Manual facilitation for placement;Tactile cues for placement Supine to Sit Details (indicate cue type and reason): Pt able to initiate L sidelying via bending R knee, moving RUE across midline; assist/multimodal cueing for LLE movement and RUE placement. Max A to lift trunk. Sitting - Scoot to Edge of Bed: 1: +1 Total assist Sitting - Scoot to Edge of Bed: Patient Percentage: 10% Sitting - Scoot to Edge of Bed Details: Manual facilitation for weight shifting;Verbal cues for technique;Tactile cues for placement Sit to Supine: 1: +1 Total assist Sit to Supine: Patient Percentage: 30% Sit to Supine - Details: Verbal cues for technique Scooting to HOB: 1: +2 Total assist Scooting to [facility]: Patient Percentage: 20% Scooting to [facility] Details: Tactile cues for placement;Tactile cues for weight bearing;Verbal cues for technique Scooting to [facility] Details (indicate cue type and reason): Multimodal cueing for RUE at bed rail, R LE weightbearing, technique. Pt with effective return demonstration. Transfers Transfers: Yes Squat Pivot Transfers: With armrests;1: +2 Total assist Squat Pivot Transfer Details: Tactile cues for placement;Verbal cues for technique;Manual facilitation for weight  shifting Squat Pivot Transfer Details (indicate cue type and reason): Verbal cueing for hand placement, technique. Manual facilitation of anterior weight shift, manual stabilization of LLE.  Locomotion  Ambulation Ambulation: No (Unsafe at this time) Gait Gait: No (Unsafe at this time) Stairs / Additional Locomotion Stairs: No (Unsafe at this time) Architect: Yes Wheelchair Assistance: 2: Max Technical sales engineer Details: Verbal cues for technique;Manual facilitation for placement;Visual cues/gestures for Astronomer:  Right upper extremity;Right lower extremity Wheelchair Parts Management: Needs assistance Distance: 20  Trunk/Postural Assessment  Cervical Assessment Cervical Assessment: Within Functional Limits Thoracic Assessment Thoracic Assessment: Within Functional Limits Lumbar Assessment Lumbar Assessment: Within Functional Limits Postural Control Postural Control: Deficits on evaluation Trunk Control: Seated EOB without UE support with posterior LOB x1 Righting Reactions: With posterior LOB in seated with bilat LE supported, pt exhibits hip strategy, but required mod A to recover.  Balance Balance Balance Assessed: Yes Static Sitting Balance Static Sitting - Balance Support: Right upper extremity supported;Feet supported Static Sitting - Level of Assistance: 3: Mod assist;5: Stand by assistance;4: Min assist Static Sitting - Comment/# of Minutes: Static sitting EOB x12 minutes total. SBA with RUE support at bed rail. Without UE support, pt requires min guard to mod A secondary to posterior LOB. Dynamic Sitting Balance Dynamic Sitting - Balance Support: Feet supported;During functional activity Dynamic Sitting - Level of Assistance: 3: Mod assist;2: Max assist Dynamic Sitting - Balance Activities: Forward lean/weight shifting Extremity Assessment  RUE Assessment RUE Assessment: Within Functional Limits LUE  Assessment LUE Assessment: Exceptions to WFL (Brunnstrum stage 1) RLE Assessment RLE Assessment: Within Functional Limits LLE Assessment LLE Assessment: Exceptions to [facility] LLE Strength LLE Overall Strength: Deficits LLE Overall Strength Comments: No palpable muscle contraction in LLE in all joints, all planes.  FIM:  FIM - Control and instrumentation engineer Devices: Arm rests Bed/Chair Transfer: 1: Supine > Sit: Total A (helper does all/Pt. < 25%);1: Sit > Supine: Total A (helper does all/Pt. < 25%);1: Two helpers FIM - Locomotion: Wheelchair Distance: 20 Locomotion: Wheelchair: 1: Travels less than 50 ft with maximal assistance (Pt: 25 - 49%) FIM - Locomotion: Ambulation Locomotion: Ambulation: 0: Activity did not occur (Unsafe at this time) FIM - Locomotion: Stairs Locomotion: Stairs: 0: Activity did not occur (Unsafe at this time)   Refer to Care Plan for Long Term Goals  Recommendations for other services: Neuropsych  Discharge Criteria: Patient will be discharged from PT if patient refuses treatment 3 consecutive times without medical reason, if treatment goals not met, if there is a change in medical status, if patient makes no progress towards goals or if patient is discharged from hospital.  The above assessment, treatment plan, treatment alternatives and goals were discussed and mutually agreed upon: by patient  Stefano Gaul 01/16/2014, 4:43 PM

## 2014-01-16 NOTE — Progress Notes (Signed)
Patient did not want to wear CPAP at this time.  RT will continue to monitor.

## 2014-01-17 ENCOUNTER — Encounter (HOSPITAL_COMMUNITY): Payer: Medicaid Other

## 2014-01-17 ENCOUNTER — Inpatient Hospital Stay (HOSPITAL_COMMUNITY): Payer: Medicaid Other | Admitting: Occupational Therapy

## 2014-01-17 ENCOUNTER — Inpatient Hospital Stay (HOSPITAL_COMMUNITY): Payer: Medicaid Other

## 2014-01-17 ENCOUNTER — Inpatient Hospital Stay (HOSPITAL_COMMUNITY): Payer: Medicaid Other | Admitting: Physical Therapy

## 2014-01-17 DIAGNOSIS — E1142 Type 2 diabetes mellitus with diabetic polyneuropathy: Secondary | ICD-10-CM

## 2014-01-17 DIAGNOSIS — I6340 Cerebral infarction due to embolism of unspecified cerebral artery: Secondary | ICD-10-CM

## 2014-01-17 DIAGNOSIS — I5032 Chronic diastolic (congestive) heart failure: Secondary | ICD-10-CM

## 2014-01-17 DIAGNOSIS — I10 Essential (primary) hypertension: Secondary | ICD-10-CM

## 2014-01-17 DIAGNOSIS — E1149 Type 2 diabetes mellitus with other diabetic neurological complication: Secondary | ICD-10-CM

## 2014-01-17 DIAGNOSIS — I428 Other cardiomyopathies: Secondary | ICD-10-CM

## 2014-01-17 LAB — GLUCOSE, CAPILLARY
Glucose-Capillary: 244 mg/dL — ABNORMAL HIGH (ref 70–99)
Glucose-Capillary: 272 mg/dL — ABNORMAL HIGH (ref 70–99)
Glucose-Capillary: 177 mg/dL — ABNORMAL HIGH (ref 70–99)
Glucose-Capillary: 210 mg/dL — ABNORMAL HIGH (ref 70–99)

## 2014-01-17 MED ORDER — INSULIN GLARGINE 100 UNIT/ML ~~LOC~~ SOLN
16.0000 [IU] | Freq: Every day | SUBCUTANEOUS | Status: DC
  Administered 2014-01-17 – 2014-01-23 (×7): 16 [IU] via SUBCUTANEOUS
  Filled 2014-01-17 (×7): qty 0.16

## 2014-01-17 MED ORDER — MICONAZOLE NITRATE 2 % VA CREA
1.0000 | TOPICAL_CREAM | Freq: Every day | VAGINAL | Status: AC
  Administered 2014-01-17 – 2014-01-23 (×7): 1 via VAGINAL
  Filled 2014-01-17 (×3): qty 45

## 2014-01-17 MED ORDER — CLOTRIMAZOLE 2 % VA CREA
1.0000 | TOPICAL_CREAM | Freq: Every day | VAGINAL | Status: DC
  Filled 2014-01-17: qty 22.2

## 2014-01-17 NOTE — Progress Notes (Signed)
Speech Language Pathology Daily Session Note  Patient Details  Name: [name] MRN: [id number] Date of Birth: Aug 30, 1966  Today's Date: 01/17/2014 Time: 1130-1215 Time Calculation (min): 45 min  Short Term Goals: Week 1: SLP Short Term Goal 1 (Week 1): Pt will consume current diet with minimal overt s/s of aspiration with supervision verbal cues.  SLP Short Term Goal 2 (Week 1): Pt will demonstrate efficient mastication with trials of Dys. 3 textures with supervision verbal cues.  SLP Short Term Goal 3 (Week 1): Pt will demonstrate functional problem solving for complex tasks with supervision verbal cues.  SLP Short Term Goal 4 (Week 1): Pt will utilize speech intelligibility strategies at the sentence level with Mod I.  SLP Short Term Goal 5 (Week 1): Pt will perfrom lingual and labial oral-motor exercises with Min A verbal and visual cues.  SLP Short Term Goal 6 (Week 1): Pt will attend to left field of enviornment/body during functional tasks with mod I.   Skilled Therapeutic Interventions: Skilled treatment focused on swallowing and cognitive goals. Upon SLP arrival, pt reported fatigue due to elevated blood sugars. Pt returned to bed with assistance from nursing and positioned in an appropriate posture for safe PO intake. SLP facilitated session with skilled observation of lunch meal, consisting of Dys 2 textures and thin liquids via straw sips with no overt s/s of aspiration observed. Pt utilized safe swallowing strategies with supervision level verbal cueing, with trace oral residuals remaining on weaker side that cleared with a liquid wash. Will plan to trial Dys 3 textures as pt becomes less fatigued, as she is demonstrating adequate oral clearance with current diet. Throughout meal, pt participated in complex functional discussion regarding her own health care needs with Mod I. Continue plan of care.   FIM:  Comprehension Comprehension Mode: Auditory Comprehension: 5-Follows  basic conversation/direction: With no assist Expression Expression Mode: Verbal Expression: 5-Expresses basic needs/ideas: With no assist Social Interaction Social Interaction: 5-Interacts appropriately 90% of the time - Needs monitoring or encouragement for participation or interaction. Problem Solving Problem Solving: 5-Solves basic problems: With no assist Memory Memory: 6-More than reasonable amt of time FIM - Eating Eating Activity: 5: Supervision/cues;5: Set-up assist for open containers  Pain Pain Assessment Pain Assessment: No/denies pain  Therapy/Group: Individual Therapy   Maxcine Ham, M.A. CCC-SLP [phone]   Maxcine Ham 01/17/2014, 1:22 PM

## 2014-01-17 NOTE — Care Management Note (Signed)
[facility] Individual Statement of Services  Patient Name:  [name]  Date:  01/17/2014  Welcome to the [facility]patient [facility].  Our goal is to provide you with an individualized program based on your diagnosis and situation, designed to meet your specific needs.  With this comprehensive rehabilitation program, you will be expected to participate in at least 3 hours of rehabilitation therapies Monday-Friday, with modified therapy programming on the weekends.  Your rehabilitation program will include the following services:  Physical Therapy (PT), Occupational Therapy (OT), Speech Therapy (ST), 24 hour per day rehabilitation nursing, Therapeutic Recreaction (TR), Neuropsychology, Case Management (Social Worker), Rehabilitation Medicine, Nutrition Services and Pharmacy Services  Weekly team conferences will be held on Wednesday to discuss your progress.  Your Social Worker will talk with you frequently to get your input and to update you on team discussions.  Team conferences with you and your family in attendance may also be held.  Expected length of stay: 3 weeks Overall anticipated outcome: min level  Depending on your progress and recovery, your program may change. Your Social Worker will coordinate services and will keep you informed of any changes. Your Social Worker's name and contact numbers are listed  below.  The following services may also be recommended but are not provided by the [facility]:   Driving Evaluations  Home Health Rehabiltiation Services  Outpatient Rehabilitation Services    Arrangements will be made to provide these services after discharge if needed.  Arrangements include referral to agencies that provide these services.  Your insurance has been verified to be:  Medicaid Your primary doctor is:  Dr Lars Masson  Pertinent information will be shared with your doctor and your insurance company.  Social  Worker:  Dossie Der, SW [phone] or ([phone]  Information discussed with and copy given to patient by: Lucy Chris, 01/17/2014, 8:16 AM

## 2014-01-17 NOTE — Progress Notes (Signed)
Subjective/Complaints: 48 y.o. right-handed female with history of uncontrolled hypertension, diabetes mellitus peripheral neuropathy, morbid obesity, nonischemic cardiomyopathy, chronic diastolic heart failure, CAD status post pacemaker placement and chronic back pain followed at outpatient rehabilitation services. Admitted 01/09/2014 with left facial weakness and slurred speech. Patient with previous admission 10/07/2013 with similar symptoms workup negative as per neurology services. Cranial CT scan consistent with possible right middle cerebral artery territory infarct. MRI not completed secondary to pacemaker. Patient did not receive TPA. CTA view of the head negative for hemorrhage did show occluded branch MCA vessel. CTA of the neck was negative.. Echocardiogram with ejection fraction of 40% no wall motion abnormalities.. TEE showed moderately depressed LVEF without thrombus or vegetation. . Currently maintained on aspirin for CVA prophylaxis. Subcutaneous heparin for DVT prophylaxis. Diet has been advanced to a regular consistency  Wants to get OOB  Review of Systems - Negative except back pain Objective: Vital Signs: Blood pressure 107/78, pulse 77, temperature 98.3 F (36.8 C), temperature source Oral, resp. rate 18, height $RemoveBe'5\' 9"'rthoocWMj$  (1.753 m), weight 124.5 kg (274 lb 7.6 oz), last menstrual period 12/21/2013, SpO2 100.00%. No results found. Results for orders placed during the hospital encounter of 01/15/14 (from the past 72 hour(s))  GLUCOSE, CAPILLARY     Status: Abnormal   Collection Time    01/15/14  8:42 PM      Result Value Range   Glucose-Capillary 187 (*) 70 - 99 mg/dL  CBC     Status: None   Collection Time    01/15/14  9:30 PM      Result Value Range   WBC 10.4  4.0 - 10.5 K/uL   RBC 4.61  3.87 - 5.11 MIL/uL   Hemoglobin 12.9  12.0 - 15.0 g/dL   HCT 39.4  36.0 - 46.0 %   MCV 85.5  78.0 - 100.0 fL   MCH 28.0  26.0 - 34.0 pg   MCHC 32.7  30.0 - 36.0 g/dL   RDW 13.1  11.5 -  15.5 %   Platelets 219  150 - 400 K/uL  CREATININE, SERUM     Status: Abnormal   Collection Time    01/15/14  9:30 PM      Result Value Range   Creatinine, Ser 0.87  0.50 - 1.10 mg/dL   GFR calc non Af Amer 78 (*) >90 mL/min   GFR calc Af Amer >90  >90 mL/min   Comment: (NOTE)     The eGFR has been calculated using the CKD EPI equation.     This calculation has not been validated in all clinical situations.     eGFR's persistently <90 mL/min signify possible Chronic Kidney     Disease.  CBC WITH DIFFERENTIAL     Status: None   Collection Time    01/16/14  5:40 AM      Result Value Range   WBC 10.1  4.0 - 10.5 K/uL   RBC 4.71  3.87 - 5.11 MIL/uL   Hemoglobin 13.0  12.0 - 15.0 g/dL   HCT 40.3  36.0 - 46.0 %   MCV 85.6  78.0 - 100.0 fL   MCH 27.6  26.0 - 34.0 pg   MCHC 32.3  30.0 - 36.0 g/dL   RDW 13.0  11.5 - 15.5 %   Platelets 212  150 - 400 K/uL   Neutrophils Relative % 55  43 - 77 %   Neutro Abs 5.5  1.7 - 7.7 K/uL   Lymphocytes Relative  33  12 - 46 %   Lymphs Abs 3.4  0.7 - 4.0 K/uL   Monocytes Relative 10  3 - 12 %   Monocytes Absolute 1.0  0.1 - 1.0 K/uL   Eosinophils Relative 2  0 - 5 %   Eosinophils Absolute 0.2  0.0 - 0.7 K/uL   Basophils Relative 0  0 - 1 %   Basophils Absolute 0.0  0.0 - 0.1 K/uL  COMPREHENSIVE METABOLIC PANEL     Status: Abnormal   Collection Time    01/16/14  5:40 AM      Result Value Range   Sodium 139  137 - 147 mEq/L   Potassium 3.9  3.7 - 5.3 mEq/L   Chloride 102  96 - 112 mEq/L   CO2 25  19 - 32 mEq/L   Glucose, Bld 177 (*) 70 - 99 mg/dL   BUN 11  6 - 23 mg/dL   Creatinine, Ser 6.19  0.50 - 1.10 mg/dL   Calcium 8.9  8.4 - 69.4 mg/dL   Total Protein 7.5  6.0 - 8.3 g/dL   Albumin 3.6  3.5 - 5.2 g/dL   AST 16  0 - 37 U/L   ALT 12  0 - 35 U/L   Alkaline Phosphatase 76  39 - 117 U/L   Total Bilirubin 0.4  0.3 - 1.2 mg/dL   GFR calc non Af Amer 75 (*) >90 mL/min   GFR calc Af Amer 87 (*) >90 mL/min   Comment: (NOTE)     The eGFR has  been calculated using the CKD EPI equation.     This calculation has not been validated in all clinical situations.     eGFR's persistently <90 mL/min signify possible Chronic Kidney     Disease.  GLUCOSE, CAPILLARY     Status: Abnormal   Collection Time    01/16/14  7:19 AM      Result Value Range   Glucose-Capillary 168 (*) 70 - 99 mg/dL   Comment 1 Notify RN    GLUCOSE, CAPILLARY     Status: Abnormal   Collection Time    01/16/14 11:54 AM      Result Value Range   Glucose-Capillary 233 (*) 70 - 99 mg/dL   Comment 1 Notify RN    GLUCOSE, CAPILLARY     Status: Abnormal   Collection Time    01/16/14  4:44 PM      Result Value Range   Glucose-Capillary 188 (*) 70 - 99 mg/dL   Comment 1 Notify RN    GLUCOSE, CAPILLARY     Status: Abnormal   Collection Time    01/16/14  8:31 PM      Result Value Range   Glucose-Capillary 206 (*) 70 - 99 mg/dL   Comment 1 Notify RN       HEENT: white coating on tongue Cardio: RRR and no murmur Resp: CTA B/L and unlabored GI: BS positive and non distended Extremity:  Pulses positive and No Edema Skin:   Intact Neuro: Alert/Oriented, Abnormal Sensory absent to LT LUE and intact LLE, Abnormal Motor 0/5 in LUE and LLE and Other no evidence of L field cut Musc/Skel:  Other back pain  Gen NAD   Assessment/Plan: 1. Functional deficits secondary to Cardioembolic R MCA infarct which require 3+ hours per day of interdisciplinary therapy in a comprehensive inpatient rehab setting. Physiatrist is providing close team supervision and 24 hour management of active medical problems listed below. Physiatrist  and rehab team continue to assess barriers to discharge/monitor patient progress toward functional and medical goals. FIM: FIM - Bathing Bathing Steps Patient Completed: Chest;Left Arm;Abdomen Bathing: 2: Max-Patient completes 3-4 39f 10 parts or 25-49%  FIM - Upper Body Dressing/Undressing Upper body dressing/undressing steps patient completed: Put  head through opening of pull over shirt/dress Upper body dressing/undressing: 2: Max-Patient completed 25-49% of tasks FIM - Lower Body Dressing/Undressing Lower body dressing/undressing: 1: Total-Patient completed less than 25% of tasks     FIM - Radio producer Devices: Recruitment consultant Transfers: 1-Two helpers  FIM - Control and instrumentation engineer Devices: Arm rests Bed/Chair Transfer: 1: Supine > Sit: Total A (helper does all/Pt. < 25%);1: Sit > Supine: Total A (helper does all/Pt. < 25%);1: Two helpers  FIM - Locomotion: Wheelchair Distance: 20 Locomotion: Wheelchair: 1: Travels less than 50 ft with maximal assistance (Pt: 25 - 49%) FIM - Locomotion: Ambulation Locomotion: Ambulation: 0: Activity did not occur (Unsafe at this time)  Comprehension Comprehension Mode: Auditory Comprehension: 5-Follows basic conversation/direction: With no assist  Expression Expression Mode: Verbal Expression: 5-Expresses basic 90% of the time/requires cueing < 10% of the time.  Social Interaction Social Interaction: 5-Interacts appropriately 90% of the time - Needs monitoring or encouragement for participation or interaction.  Problem Solving Problem Solving: 4-Solves basic 75 - 89% of the time/requires cueing 10 - 24% of the time  Memory Memory: 5-Recognizes or recalls 90% of the time/requires cueing < 10% of the time  Medical Problem List and Plan:  1. Cardioembolic right MCA infarct, left hemiparesis and LUE sensory def  2. DVT Prophylaxis/Anticoagulation: Subcutaneous heparin. Monitor platelet counts and any signs of bleeding  3. Pain Management/chronic pain syndrome: Tegretol 200 mg twice a day for neuropathic pain. Patient also on Flexeril prior to admission but held due to some lethargy.  4. Neuropsych: This patient is capable of making decisions on her own behalf.  5. Nonischemic cardiomyopathy, CAD with pacemaker, placed Sept  2014. No chest pain or shortness of breath  6. Diabetes mellitus with peripheral neuropathy. Hemoglobin A1c 10.7.Increase Lantus insulin 16 units each bedtime. Check blood sugars a.c. and at bedtime  7. Hypertension. Toprol-XL 25 mg twice a day, Lanoxin 0.125 mg daily. Monitor with increased activity. Recent blood pressure meds held due to some hypotension we'll resume as tolerated  8. Chronic diastolic heart failure. Monitor for any signs of fluid overload. Daily i's and o's and weights. Patient on Lasix 40 mg twice a day prior to admission. Consider resuming back as needed monitor fluid intake 9. Hyperlipidemia. Lipitor   LOS (Days) 2 A FACE TO FACE EVALUATION WAS PERFORMED  Akeem Heppler E 01/17/2014, 6:36 AM

## 2014-01-17 NOTE — Plan of Care (Signed)
Problem: RH BLADDER ELIMINATION Goal: RH STG MANAGE BLADDER WITH ASSISTANCE STG Manage Bladder With mod Assistance  Outcome: Not Progressing Incont. of urine

## 2014-01-17 NOTE — Progress Notes (Signed)
Nursing Note: No void in 8 hrs.In and out cathed for 300 cc of dark amber urine.Fliuds encouraged.wbb

## 2014-01-17 NOTE — Progress Notes (Addendum)
Occupational Therapy Session Note  Patient Details  Name: [name] MRN: [id number] Date of Birth: [birth year]  Today's Date: 01/17/2014 Time: 1000-1100 Time Calculation (min): 60 min  Short Term Goals: Week 1:  OT Short Term Goal 1 (Week 1): Patient will complete sit<>stand during self-care task with max assist  OT Short Term Goal 2 (Week 1): Patient will complete squat pivot transfer total assist +1 OT Short Term Goal 3 (Week 1): Pt will complete UB dressing with mod assist OT Short Term Goal 4 (Week 1): Pt will complete toilet task with total assist +1  Skilled Therapeutic Interventions/Progress Updates:    Pt resting in w/c upon arrival and agreeable to bathing and dressing w/c level at sink. Pt required min verbal cues to initiate UB bathing and dressing tasks and instructional cues to don pullover shirt.  Pt required [facility] assistance to use LUE to bathe RUE. Pt stated that her nurse had just washed her up and put on clean clothing prior to therapy session but she didn't mind doing it again.  Pt declined LB bathing and dressing.  Focus on session transitioned to sit<>stand from w/c.  Pt initially unable to initiate sit<>stand but with continued practice/repetition was able to complete a semi-squat with max A.  Pt performed semi-squats X 5 with rest breaks between.  Pt completed grooming tasks seated in w/c at sink.  Focus on activity tolerance, hemi bathing/dressing techniques, sit<>stand, and safety  Awareness.Pt denied pain.  Therapy Documentation Precautions:  Precautions Precautions: Fall;ICD/Pacemaker Precaution Comments: pacemaker; chronic back pain Restrictions Weight Bearing Restrictions: No    See FIM for current functional status  Therapy/Group: Individual Therapy  Rich Brave 01/17/2014, 11:03 AM

## 2014-01-17 NOTE — Progress Notes (Signed)
Physical Therapy Session Note  Patient Details  Name: [name] MRN: [id number] Date of Birth: 1966/12/11  Today's Date: 01/17/2014 Time: 0900-1000 Time Calculation (min): 60 min  Short Term Goals: Week 1:  PT Short Term Goal 1 (Week 1): Pt will perform sit>supine with max A using bed rail with HOB flat. PT Short Term Goal 2 (Week 1): Pt will perform supine> sit with mod A using bed rail with HOB flat. PT Short Term Goal 3 (Week 1): Pt will perform bed<>chair transfer with total A of single therapist. PT Short Term Goal 4 (Week 1): Pt will perform w/c mobility x50' in controlled environment with mod A. PT Short Term Goal 5 (Week 1): Pt will perform static sitting x60 seconds without UE support requiring supervision.  Skilled Therapeutic Interventions/Progress Updates:    Pt received supine in bed; asleep, easily aroused. Pt reports fatigue but agreeable to session. Performed supine>sit with HOB flat, no rails with max A to lift trunk; verbal/tactile cueing for utilizing RLE to move LLE to EOB. Once seated EOB, donned shirt and pants with intermittent RUE support at rail, min guard-mod A for dynamic sitting balance. Scoot pivot transfer (to R side) with +2A, manual stabilization of LLE, and manual facilitation of anterior weight shift. Seated in w/c, pt performed unilateral scooting technique with min A, no cueing required.  Transported pt to gym in w/c, where pt performed scoot pivot transfer from w/c>mat (to R side) with +2A and manual facilitation/cueing as described above. Sit>supine with +2A for trunk and bilat LE management. In supine, pt performed scooting to [employer] with +2A, manual facilitation of LLE weightbearing. See below for detailed description of NMR. Supine>sit with max A, effective within-session carryover of cueing for LLE management. Scoot pivot transfer from mat table>w/c (to L side) with +2A, cueing/manual facilitation as described. More assist required when transferring to L  side as compared to R side.  Returned to room via self-propulsion of w/c x40' in controlled environment with R hemi-technique requiring mod A during initial 25' for facilitation of steering technique using RLE. Pt with effective return demonstration of technique during final 15' prior to pt requesting rest break due to fatigue. Transported pt remaining distance to room, where pt was left seated in w/c with quick release belt on for safety and all needs within reach.  Therapy Documentation Precautions:  Precautions Precautions: Fall;ICD/Pacemaker Precaution Comments: pacemaker; chronic back pain Restrictions Weight Bearing Restrictions: No Vital Signs: Therapy Vitals Temp: 98.2 F (36.8 C) Temp src: Oral Pulse Rate: 76 Resp: 18 BP: 157/79 mmHg Patient Position, if appropriate: Lying Oxygen Therapy SpO2: 97 % Pain:  Pt reports no pain during session. NMR: R side lying: LLE NMR using powder board with mirror for visual feedback. L hip flexion, extension 2x10 reps with tactile cueing at hip flexors and gluteus maximus, respectively; L hip/knee flexion x15 reps with tactile cueing at hip flexors, hamstrings. Trace L hip flexor contraction palpable during final 10 reps of NMR.  See FIM for current functional status  Therapy/Group: Individual Therapy  Hobble, Lorenda Ishihara 01/17/2014, 6:57 PM

## 2014-01-17 NOTE — IPOC Note (Signed)
Overall Plan of Care [facility]) Patient Details Name: [name] MRN: [id number] DOB: [birth year]  Admitting Diagnosis: R MCA infarct  Hospital Problems: Active Problems:   CVA (cerebral infarction)     Functional Problem List: Nursing Bladder;Bowel;Edema;Safety;Skin Integrity;Nutrition;Pain  PT Balance;Sensory;Endurance;Motor  OT Balance;Cognition;Endurance;Motor;Pain;Perception;Safety;Sensory;Vision  SLP Cognition  TR         Basic ADL's: OT Grooming;Bathing;Dressing;Toileting     Advanced  ADL's: OT       Transfers: PT Bed Mobility;Bed to Chair;Car;Furniture  OT Toilet;Tub/Shower     Locomotion: PT Ambulation;Wheelchair Mobility     Additional Impairments: OT Fuctional Use of Upper Extremity  SLP Swallowing;Communication;Social Cognition expression Problem Solving;Awareness  TR      Anticipated Outcomes Item Anticipated Outcome  Self Feeding    Swallowing  Mod I with least restrictive diet   Basic self-care  min assist bathing and UB dressing, mod assist LB dressing   Toileting  mod assist    Bathroom Transfers mod assist  Bowel/Bladder  cont of bowel and bladder  Transfers  Min A  Locomotion  Mod A  Communication  Mod I  Cognition     Pain  less than 3  Safety/Judgment  adhere to safety protocol in place   Therapy Plan: PT Intensity: Minimum of 1-2 x/day ,45 to 90 minutes PT Frequency: 5 out of 7 days PT Duration Estimated Length of Stay: 21-26 days OT Intensity: Minimum of 1-2 x/day, 45 to 90 minutes OT Frequency: 5 out of 7 days OT Duration/Estimated Length of Stay: 21-26 days SLP Intensity: Minumum of 1-2 x/day, 30 to 90 minutes SLP Frequency: 5 out of 7 days SLP Duration/Estimated Length of Stay: 21-26 days       Team Interventions: Nursing Interventions Patient/Family Education;Bladder Management;Bowel Management;Disease Management/Prevention;Skin Care/Wound Management;Medication Management;Pain Management  PT interventions  Ambulation/gait training;Balance/vestibular training;Disease management/prevention;Discharge planning;Functional mobility training;Patient/family education;Wheelchair propulsion/positioning;UE/LE Strength taining/ROM;Visual/perceptual remediation/compensation;Therapeutic Activities;Therapeutic Exercise;Splinting/orthotics;Neuromuscular re-education;Psychosocial support;DME/adaptive equipment instruction  OT Interventions Balance/vestibular training;Cognitive remediation/compensation;Discharge planning;Community reintegration;DME/adaptive equipment instruction;Functional mobility training;Neuromuscular re-education;Psychosocial support;Pain management;Patient/family education;Self Care/advanced ADL retraining;Therapeutic Activities;Therapeutic Exercise;UE/LE Strength taining/ROM;UE/LE Coordination activities;Visual/perceptual remediation/compensation  SLP Interventions Cueing hierarchy;Cognitive remediation/compensation;Environmental controls;Functional tasks;Speech/Language facilitation;Therapeutic Activities;Patient/family education;Dysphagia/aspiration precaution training;Internal/external aids  TR Interventions    SW/CM Interventions      Team Discharge Planning: Destination: PT-Home ,OT- Home , SLP-Home Projected Follow-up: PT-Home health PT, OT-  Home health OT, SLP-Outpatient SLP;Home Health SLP Projected Equipment Needs: PT-To be determined;Wheelchair (measurements);Wheelchair cushion (measurements), OT- Tub/shower bench, SLP-None recommended by SLP Equipment Details: PT-Pt will likely need w/c. Additional equipment, w/c measurements TBD, OT-  Patient/family involved in discharge planning: PT- Patient,  OT-Patient, SLP-Patient  MD ELOS: 20-25 days Medical Rehab Prognosis:  Good Assessment: 48 y.o. right-handed female with history of uncontrolled hypertension, diabetes mellitus peripheral neuropathy, morbid obesity, nonischemic cardiomyopathy, chronic diastolic heart failure, CAD status post  pacemaker placement and chronic back pain followed at outpatient rehabilitation services. Admitted 01/09/2014 with left facial weakness and slurred speech. Patient with previous admission 10/07/2013 with similar symptoms workup negative as per neurology services. Cranial CT scan consistent with possible right middle cerebral artery territory infarct. MRI not completed secondary to pacemaker. Patient did not receive TPA. CTA view of the head negative for hemorrhage did show occluded branch MCA vessel. CTA of the neck was negative.. Echocardiogram with ejection fraction of 40% no wall motion abnormalities.. TEE showed moderately depressed LVEF without thrombus or vegetation  Now requiring 24/7 Rehab RN,MD, as well as CIR level PT, OT and SLP.  Treatment team will focus on ADLs and  mobility with goals set at min/Mod A   See Team Conference Notes for weekly updates to the plan of care

## 2014-01-17 NOTE — Progress Notes (Signed)
Social Work Assessment and Plan Social Work Assessment and Plan  Patient Details  Name: [name] MRN: [id number] Date of Birth: 09-18-1966  Today's Date: 01/17/2014  Problem List:  Patient Active Problem List   Diagnosis Date Noted  . CVA (cerebral infarction) 01/12/2014  . Aphasia 01/09/2014  . Perimenopausal menorrhagia 12/20/2013  . Candida vaginitis 12/20/2013  . Pyuria 12/20/2013  . Biventricular implantable cardiac defibrillator -[employer]tr[employer] 11/23/2013  . Mixed stress and urge urinary incontinence 10/11/2013  . Headache(784.0) 10/06/2013  . Precordial pain 09/25/2013  . Preventative health care 09/13/2013  . Chronic combined systolic and diastolic heart failure 08/01/2013  . Nonischemic cardiomyopathy 08/01/2013  . Goiter 08/01/2013  . LBBB (left bundle branch block) 07/19/2013  . Prolonged Q-T interval on ECG 06/06/2013  . Depression 04/09/2013  . Myofascial pain 04/02/2013  . Osteoarthrosis, localized, primary, knee, bilateral 04/14/2012  . Lumbar spondylosis 04/14/2012  . Diabetic peripheral neuropathy 04/14/2012  . Obesity 11/23/2011  . Hyperlipidemia 11/23/2011  . Type 2 diabetes mellitus, uncontrolled, with neuropathy 09/03/2011  . OSA (obstructive sleep apnea) 08/27/2011  . ESSENTIAL HYPERTENSION 08/27/2010  . CHEST PAIN, HX OF 08/27/2010   Past Medical History:  Past Medical History  Diagnosis Date  . Hypertension     Poorly controlled. Has had HTN since age 72. Angioedema with ACEI.  24 Hr urine and renal arterial dopplers ordered . . . Never done  . Type II or unspecified type diabetes mellitus without mention of complication, uncontrolled DX: 2002  . NICM (nonischemic cardiomyopathy)     EF 45-50% in 8/12, cath 6/12 showed normal coronaries, EF 50-55% by LV gram  . Morbid obesity   . Allergic rhinitis   . HLD (hyperlipidemia)   . OSA on CPAP     sleep study in 8/12 showed moderate to severe OSA requiring CPAP  . Chronic lower back pain      secondary to DJD, obsetiy, hip problems. Followed by Dr. Ivory Broad (pain management)  . 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 Chronic diastolic heart failure      Primarily diastolic CHF: Likely due to uncontrolled HTN. Last echo (8/12) with EF 45-50%, mild to moderate LVH with some asymmetric septal hypertrophy, RV normal size and systolic function. EF 50-55% by LV-gram in 6/12.   . Degeneration of lumbar or lumbosacral intervertebral disc   . Polyneuropathy in diabetes(357.2)   . Thoracic or lumbosacral neuritis or radiculitis, unspecified   . Calcifying tendinitis of shoulder   . LBBB (left bundle branch block)   . Chronic combined systolic and diastolic CHF (congestive heart failure)     EF 40-45% by echo 12/06/2012  . Coronary artery disease     questionable. LHC 05/2011 showing normal coronaries // Followed at [facility], Dr. Shirlee Latch  . GERD (gastroesophageal reflux disease)    Past Surgical History:  Past Surgical History  Procedure Laterality Date  . Breast biopsies Bilateral 2011    patient reports benign results  . Tubal ligation  05/31/1985  . Hand surgery Left   . Shoulder surgery Right   . Dental surgery      tumors removed   . Cardiac catheterization  05/2011  . Pacemaker/defibrillator placed  08/22/13 Left 08/22/13  . Tee without cardioversion N/A 01/14/2014    Procedure: TRANSESOPHAGEAL ECHOCARDIOGRAM (TEE);  Surgeon: Thurmon Fair, MD;  Location: [facility] ENDOSCOPY;  Service: Cardiovascular;  Laterality: N/A;   Social History:  reports that she has never smoked.  She has never used smokeless tobacco. She reports that she does not drink alcohol or use illicit drugs.  Family / Support Systems Marital Status: Single Patient Roles: Parent Children: Glass blower/designer  [phone] Other Supports: Geri [place]  [phone] Anticipated Caregiver: Daughter and son Ability/Limitations of Caregiver: Daughter works during the day, son is  not employed right now Medical laboratory scientific officer: 24/7 Family Dynamics: Close knit family pt recently lost her sister to a CVA and is having difficulty dealing with this loss.  She was very close to her sister and could always count on her when she needed someone or something.  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  The death of her sister has set her back and she is at a loss, since just happened last Friday while she was here from her stroke.  She is willing to push past this and focus on her recovery she knows this is waht her sister would want and she will honor this not only for her  but for herself. Recent Psychosocial Issues: Other medical issues-recent loss of her sister and cousin Pyschiatric History: No history-deferred depression screen at this time due to just found out cousin died.  Will refer to Neuro-psych and offer supportive counseling while here.  Will return to do depression screen.  Pt is open and honest regaridng her feelings and concerns. Substance Abuse History: No issues  Patient / Family Perceptions, Expectations & Goals Pt/Family understanding of illness & functional limitations: Pt is able to explain her stroke and deficits.  She is encouraged by the progress she has made already since coming into the hospital.  She talks with MD daily on his rounds and feels her  questions and concerns are being addressed.  Will talk with daughter awaiting phone call back. Premorbid pt/family roles/activities: Mother, sibling, Church member, Friends, Daughter, etc Anticipated changes in roles/activities/participation: resume roles Pt/family expectations/goals: Pt states: " I want to be able to do for myself or as much as I can, I do not want to burden my children."  Awaiting daughter's return call  [employer]: None Premorbid Home Care/DME Agencies: Other (Comment) (had in past) Transportation available at discharge: Family Resource referrals recommended: Support group (specify) (CVA Support group)  Discharge Planning Living Arrangements: Alone Support Systems: Children;Parent;Other relatives;Friends/neighbors;Church/faith community Type of Residence: Private residence Insurance Resources: Medicaid (specify county) ([place]) Surveyor, quantity Resources: SSD Financial Screen Referred: No Living Expenses: Psychologist, sport and exercise Management: Patient Does the patient have any problems obtaining your medications?: No Home Management: Patient Patient/Family Preliminary Plans: Return home with her children coming to her home to provide care.  Son would be there during the day and daughter once home from work.  Unsure how comfortable son would be assisting with self care.  Will await pt's progress before addressing this issue.  Provide support and assist with discharge needs. Social Work Anticipated Follow Up Needs: HH/OP;Support Group  Clinical Impression Pleasant female who is motivated and willing to do what she can to regain her independence.  Will  support her and have Neuro-psych see to assist with her coping through sister's death and cousin's. Will assist with discharge plans and do what is necessary to support pt during this difficult time.  Lucy Chris 01/17/2014, 8:38 AM

## 2014-01-17 NOTE — Progress Notes (Signed)
Occupational Therapy Session Note  Patient Details  Name: [name] MRN: [id number] Date of Birth: [birth year]  Today's Date: 01/17/2014 Time: 1330-1400 Time Calculation (min): 30 min  Short Term Goals: Week 1:  OT Short Term Goal 1 (Week 1): Patient will complete sit<>stand during self-care task with max assist  OT Short Term Goal 2 (Week 1): Patient will complete squat pivot transfer total assist +1 OT Short Term Goal 3 (Week 1): Pt will complete UB dressing with mod assist OT Short Term Goal 4 (Week 1): Pt will complete toilet task with total assist +1  Skilled Therapeutic Interventions/Progress Updates:  Patient in bed on bed pan with ~5 family members visiting.  Family members left once this OT assisted NT and patient to get cleaned up.  Focused session on bed mobility, attending to the left, wash peri area in supine, LUE PROM and Self ROM exercises in supine.  Therapy Documentation Precautions:  Precautions Precautions: Fall;ICD/Pacemaker Precaution Comments: pacemaker; chronic back pain Restrictions Weight Bearing Restrictions: No Pain: No report of pain  Therapy/Group: Individual Therapy  Arshan Jabs 01/17/2014, 4:59 PM

## 2014-01-18 ENCOUNTER — Inpatient Hospital Stay (HOSPITAL_COMMUNITY): Payer: Medicaid Other

## 2014-01-18 ENCOUNTER — Inpatient Hospital Stay (HOSPITAL_COMMUNITY): Payer: Medicaid Other | Admitting: Physical Therapy

## 2014-01-18 DIAGNOSIS — I5032 Chronic diastolic (congestive) heart failure: Secondary | ICD-10-CM

## 2014-01-18 DIAGNOSIS — I10 Essential (primary) hypertension: Secondary | ICD-10-CM

## 2014-01-18 DIAGNOSIS — I428 Other cardiomyopathies: Secondary | ICD-10-CM

## 2014-01-18 DIAGNOSIS — I6340 Cerebral infarction due to embolism of unspecified cerebral artery: Secondary | ICD-10-CM

## 2014-01-18 DIAGNOSIS — E1142 Type 2 diabetes mellitus with diabetic polyneuropathy: Secondary | ICD-10-CM

## 2014-01-18 DIAGNOSIS — E1149 Type 2 diabetes mellitus with other diabetic neurological complication: Secondary | ICD-10-CM

## 2014-01-18 LAB — GLUCOSE, CAPILLARY
GLUCOSE-CAPILLARY: 164 mg/dL — AB (ref 70–99)
GLUCOSE-CAPILLARY: 245 mg/dL — AB (ref 70–99)
Glucose-Capillary: 199 mg/dL — ABNORMAL HIGH (ref 70–99)
Glucose-Capillary: 213 mg/dL — ABNORMAL HIGH (ref 70–99)

## 2014-01-18 MED ORDER — FUROSEMIDE 40 MG PO TABS
40.0000 mg | ORAL_TABLET | Freq: Every day | ORAL | Status: DC
  Administered 2014-01-18 – 2014-02-05 (×19): 40 mg via ORAL
  Filled 2014-01-18 (×20): qty 1

## 2014-01-18 NOTE — Progress Notes (Signed)
Occupational Therapy Session Note  Patient Details  Name: [name] MRN: [id number] Date of Birth: 30-Sep-1966  Today's Date: 01/18/2014 Time: 1000-1055 Time Calculation (min): 55 min  Short Term Goals: Week 1:  OT Short Term Goal 1 (Week 1): Patient will complete sit<>stand during self-care task with max assist  OT Short Term Goal 2 (Week 1): Patient will complete squat pivot transfer total assist +1 OT Short Term Goal 3 (Week 1): Pt will complete UB dressing with mod assist OT Short Term Goal 4 (Week 1): Pt will complete toilet task with total assist +1  Skilled Therapeutic Interventions/Progress Updates:    Session 1: Pt seen for ADL retraining with focus on bed mobility, attention to left, sitting balance, and sit<>stand. Pt received supine in bed. Completed buttocks and peri hygiene while supine in bed requiring mod assist for rolling. Completed supine>sit with max assist. Engaged in bathing while sitting EOB with supervision and min assist with LOB anteriorly/laterally when reaching to wash lower BLEs. Completed sit<>stand from bed with max-total assist +2 (min assist from second person) 3x with left lean noted. Completed stand pivot transfer +2 total assist. Pt required cues for initiation of tasks on this date and demonstrated flat affect throughout session as reported having her mind on her sister's funeral that was occurring today. Therapist provided support. Pt more motivated by standing on this date as she was unable to stand yesterday. Completed oral care at sink using LUE as stabilizer to hold toothbrush with total assist. Pt left sitting in w/c with all needs in reach.   Therapy Documentation Precautions:  Precautions Precautions: Fall;ICD/Pacemaker Precaution Comments: pacemaker; chronic back pain Restrictions Weight Bearing Restrictions: No General: General Amount of Missed OT Time (min): 5 Minutes Vital Signs:   Pain: No report of pain during therapy session.    See FIM for current functional status  Therapy/Group: Individual Therapy  Nedra Mcinnis, Vara Guardian 01/18/2014, 10:59 AM

## 2014-01-18 NOTE — Progress Notes (Signed)
Subjective/Complaints: 48 y.o. right-handed female with history of uncontrolled hypertension, diabetes mellitus peripheral neuropathy, morbid obesity, nonischemic cardiomyopathy, chronic diastolic heart failure, CAD status post pacemaker placement and chronic back pain followed at outpatient rehabilitation services. Admitted 01/09/2014 with left facial weakness and slurred speech. Patient with previous admission 10/07/2013 with similar symptoms workup negative as per neurology services. Cranial CT scan consistent with possible right middle cerebral artery territory infarct. MRI not completed secondary to pacemaker. Patient did not receive TPA. CTA view of the head negative for hemorrhage did show occluded branch MCA vessel. CTA of the neck was negative.. Echocardiogram with ejection fraction of 40% no wall motion abnormalities.. TEE showed moderately depressed LVEF without thrombus or vegetation. . Currently maintained on aspirin for CVA prophylaxis. Subcutaneous heparin for DVT prophylaxis. Diet has been advanced to a regular consistency  No new complaints. Left side still weak.  Review of Systems - Negative except back pain Objective: Vital Signs: Blood pressure 147/86, pulse 83, temperature 98 F (36.7 C), temperature source Oral, resp. rate 18, height $RemoveBe'5\' 9"'CYWlXzIDH$  (1.753 m), weight 124.5 kg (274 lb 7.6 oz), last menstrual period 12/21/2013, SpO2 94.00%. No results found. Results for orders placed during the hospital encounter of 01/15/14 (from the past 72 hour(s))  GLUCOSE, CAPILLARY     Status: Abnormal   Collection Time    01/15/14  8:42 PM      Result Value Range   Glucose-Capillary 187 (*) 70 - 99 mg/dL  CBC     Status: None   Collection Time    01/15/14  9:30 PM      Result Value Range   WBC 10.4  4.0 - 10.5 K/uL   RBC 4.61  3.87 - 5.11 MIL/uL   Hemoglobin 12.9  12.0 - 15.0 g/dL   HCT 39.4  36.0 - 46.0 %   MCV 85.5  78.0 - 100.0 fL   MCH 28.0  26.0 - 34.0 pg   MCHC 32.7  30.0 - 36.0 g/dL    RDW 13.1  11.5 - 15.5 %   Platelets 219  150 - 400 K/uL  CREATININE, SERUM     Status: Abnormal   Collection Time    01/15/14  9:30 PM      Result Value Range   Creatinine, Ser 0.87  0.50 - 1.10 mg/dL   GFR calc non Af Amer 78 (*) >90 mL/min   GFR calc Af Amer >90  >90 mL/min   Comment: (NOTE)     The eGFR has been calculated using the CKD EPI equation.     This calculation has not been validated in all clinical situations.     eGFR's persistently <90 mL/min signify possible Chronic Kidney     Disease.  CBC WITH DIFFERENTIAL     Status: None   Collection Time    01/16/14  5:40 AM      Result Value Range   WBC 10.1  4.0 - 10.5 K/uL   RBC 4.71  3.87 - 5.11 MIL/uL   Hemoglobin 13.0  12.0 - 15.0 g/dL   HCT 40.3  36.0 - 46.0 %   MCV 85.6  78.0 - 100.0 fL   MCH 27.6  26.0 - 34.0 pg   MCHC 32.3  30.0 - 36.0 g/dL   RDW 13.0  11.5 - 15.5 %   Platelets 212  150 - 400 K/uL   Neutrophils Relative % 55  43 - 77 %   Neutro Abs 5.5  1.7 - 7.7 K/uL  Lymphocytes Relative 33  12 - 46 %   Lymphs Abs 3.4  0.7 - 4.0 K/uL   Monocytes Relative 10  3 - 12 %   Monocytes Absolute 1.0  0.1 - 1.0 K/uL   Eosinophils Relative 2  0 - 5 %   Eosinophils Absolute 0.2  0.0 - 0.7 K/uL   Basophils Relative 0  0 - 1 %   Basophils Absolute 0.0  0.0 - 0.1 K/uL  COMPREHENSIVE METABOLIC PANEL     Status: Abnormal   Collection Time    01/16/14  5:40 AM      Result Value Range   Sodium 139  137 - 147 mEq/L   Potassium 3.9  3.7 - 5.3 mEq/L   Chloride 102  96 - 112 mEq/L   CO2 25  19 - 32 mEq/L   Glucose, Bld 177 (*) 70 - 99 mg/dL   BUN 11  6 - 23 mg/dL   Creatinine, Ser 0.90  0.50 - 1.10 mg/dL   Calcium 8.9  8.4 - 10.5 mg/dL   Total Protein 7.5  6.0 - 8.3 g/dL   Albumin 3.6  3.5 - 5.2 g/dL   AST 16  0 - 37 U/L   ALT 12  0 - 35 U/L   Alkaline Phosphatase 76  39 - 117 U/L   Total Bilirubin 0.4  0.3 - 1.2 mg/dL   GFR calc non Af Amer 75 (*) >90 mL/min   GFR calc Af Amer 87 (*) >90 mL/min   Comment:  (NOTE)     The eGFR has been calculated using the CKD EPI equation.     This calculation has not been validated in all clinical situations.     eGFR's persistently <90 mL/min signify possible Chronic Kidney     Disease.  GLUCOSE, CAPILLARY     Status: Abnormal   Collection Time    01/16/14  7:19 AM      Result Value Range   Glucose-Capillary 168 (*) 70 - 99 mg/dL   Comment 1 Notify RN    GLUCOSE, CAPILLARY     Status: Abnormal   Collection Time    01/16/14 11:54 AM      Result Value Range   Glucose-Capillary 233 (*) 70 - 99 mg/dL   Comment 1 Notify RN    GLUCOSE, CAPILLARY     Status: Abnormal   Collection Time    01/16/14  4:44 PM      Result Value Range   Glucose-Capillary 188 (*) 70 - 99 mg/dL   Comment 1 Notify RN    GLUCOSE, CAPILLARY     Status: Abnormal   Collection Time    01/16/14  8:31 PM      Result Value Range   Glucose-Capillary 206 (*) 70 - 99 mg/dL   Comment 1 Notify RN    GLUCOSE, CAPILLARY     Status: Abnormal   Collection Time    01/17/14  7:29 AM      Result Value Range   Glucose-Capillary 244 (*) 70 - 99 mg/dL   Comment 1 Notify RN    GLUCOSE, CAPILLARY     Status: Abnormal   Collection Time    01/17/14 11:12 AM      Result Value Range   Glucose-Capillary 272 (*) 70 - 99 mg/dL   Comment 1 Notify RN    GLUCOSE, CAPILLARY     Status: Abnormal   Collection Time    01/17/14  4:37 PM  Result Value Range   Glucose-Capillary 177 (*) 70 - 99 mg/dL  GLUCOSE, CAPILLARY     Status: Abnormal   Collection Time    01/17/14  9:18 PM      Result Value Range   Glucose-Capillary 210 (*) 70 - 99 mg/dL  GLUCOSE, CAPILLARY     Status: Abnormal   Collection Time    01/18/14  7:25 AM      Result Value Range   Glucose-Capillary 164 (*) 70 - 99 mg/dL     HEENT: white coating on tongue Cardio: RRR and no murmur Resp: CTA B/L and unlabored GI: BS positive and non distended Extremity:  Pulses positive and No Edema Skin:   Intact Neuro: Alert/Oriented,  Abnormal Sensory absent to LT LUE and intact LLE, Abnormal Motor 0/5 in LUE and LLE and Other no evidence of L field cut Musc/Skel:  Other back pain  Gen NAD   Assessment/Plan: 1. Functional deficits secondary to Cardioembolic R MCA infarct which require 3+ hours per day of interdisciplinary therapy in a comprehensive inpatient rehab setting. Physiatrist is providing close team supervision and 24 hour management of active medical problems listed below. Physiatrist and rehab team continue to assess barriers to discharge/monitor patient progress toward functional and medical goals. FIM: FIM - Bathing Bathing Steps Patient Completed: Chest;Left Arm;Abdomen Bathing: 2: Max-Patient completes 3-4 70f 10 parts or 25-49% (did not attempt LB bathing)  FIM - Upper Body Dressing/Undressing Upper body dressing/undressing steps patient completed: Put head through opening of pull over shirt/dress;Thread/unthread left sleeve of pullover shirt/dress;Thread/unthread right sleeve of pullover shirt/dresss Upper body dressing/undressing: 4: Min-Patient completed 75 plus % of tasks FIM - Lower Body Dressing/Undressing Lower body dressing/undressing: 0: Activity did not occur     FIM - Radio producer Devices: Recruitment consultant Transfers: 1-Two helpers  FIM - Control and instrumentation engineer Devices: Arm rests Bed/Chair Transfer: 1: Two helpers;2: Sit > Supine: Max A (lifting assist/Pt. 25-49%)  FIM - Locomotion: Wheelchair Distance: 40 Locomotion: Wheelchair: 1: Travels less than 50 ft with moderate assistance (Pt: 50 - 74%) FIM - Locomotion: Ambulation Locomotion: Ambulation: 0: Activity did not occur  Comprehension Comprehension Mode: Auditory Comprehension: 5-Follows basic conversation/direction: With no assist  Expression Expression Mode: Verbal Expression: 5-Expresses basic needs/ideas: With no assist  Social Interaction Social Interaction:  5-Interacts appropriately 90% of the time - Needs monitoring or encouragement for participation or interaction.  Problem Solving Problem Solving: 5-Solves basic problems: With no assist  Memory Memory: 6-More than reasonable amt of time  Medical Problem List and Plan:  1. Cardioembolic right MCA infarct, left hemiparesis and LUE sensory def  2. DVT Prophylaxis/Anticoagulation: Subcutaneous heparin. Monitor platelet counts and any signs of bleeding  3. Pain Management/chronic pain syndrome: Tegretol 200 mg twice a day for neuropathic pain. Patient also on Flexeril prior to admission but held due to some lethargy.  4. Neuropsych: This patient is capable of making decisions on her own behalf.  5. Nonischemic cardiomyopathy, CAD with pacemaker, placed Sept 2014. No chest pain or shortness of breath  6. Diabetes mellitus with peripheral neuropathy. Hemoglobin A1c 10.7.Increased Lantus insulin to 16 units each bedtime on Thursday. Follow for pattern  7. Hypertension. Toprol-XL 25 mg twice a day, Lanoxin 0.125 mg daily. Monitor with increased activity. Recent blood pressure meds held due to some hypotension we'll resume as tolerated  8. Chronic diastolic heart failure. Monitor for any signs of fluid overload. Daily i's and o's and weights. Patient on  Lasix 40 mg twice a day prior to admission. Resume lasix at $Remove'40mg'EvWKSbc$  for now.  -recheck BMET on Monday 9. Hyperlipidemia. Lipitor   LOS (Days) 3 A FACE TO FACE EVALUATION WAS PERFORMED  SWARTZ,ZACHARY T 01/18/2014, 9:13 AM

## 2014-01-18 NOTE — Progress Notes (Signed)
Occupational Therapy Note  Patient Details  Name: [name] MRN: [id number] Date of Birth: 03/13/1966 Today's Date: 01/18/2014  Patient missing 30 min of skilled OT this PM secondary to patient refusal. Patient received supine in bed sleeping and easily aroused. Patient reports being sad about missing funeral for sister and requesting to "just sleep." Therapist provided encouragement to patient and allowed her to remain in bed. Discussed progress patient made in therapy today and she was motivated by this.    Darlean Warmoth N 01/18/2014, 1:15 PM

## 2014-01-18 NOTE — Progress Notes (Signed)
Pt refuses to wear CPAP tonight. Pt stated it was not comfortable the previous night. Pt resting comfortably in no distress.

## 2014-01-18 NOTE — Progress Notes (Addendum)
Physical Therapy Session Note  Patient Details  Name: [name] MRN: [id number] Date of Birth: 1966/03/27  Today's Date: 01/18/2014 Time: 1109-1209 Time Calculation (min): 60 min (Co-tx with AH, PT)  Short Term Goals: Week 1:  PT Short Term Goal 1 (Week 1): Pt will perform sit>supine with max A using bed rail with HOB flat. PT Short Term Goal 2 (Week 1): Pt will perform supine> sit with mod A using bed rail with HOB flat. PT Short Term Goal 3 (Week 1): Pt will perform bed<>chair transfer with total A of single therapist. PT Short Term Goal 4 (Week 1): Pt will perform w/c mobility x50' in controlled environment with mod A. PT Short Term Goal 5 (Week 1): Pt will perform static sitting x60 seconds without UE support requiring supervision.  Skilled Therapeutic Interventions/Progress Updates:    Skilled PT co-tx with AH. Pt received seated in w/c; pt reports feeling fatigued but agreeable to therapy. Session focused on initiating gait and increasing independence with functional transfers and w/c mobility. Self-propulsion of w/c using R hemi-technique x85' in controlled environment with max A; max multimodal cueing for technique with focus on manual approximation at R knee to increase RLE weightbearing. Verbal cueing for use of R heel for steering/propulsion.  Sit>stand x2 from w/c with R hallway hand rail, max A. Gait x20', x10' with RUE support at hallway hand rail requiring +2A for w/c follow. PT positioned under LUE providing manual facilitation of the following: anterior weight shift, L pelvic elevation, LLE advancement, manual stabilization of L knee during LLE stance, L weight shift prior to RLE advancement, and tactile cueing at L gluteus maximus during RLE advancement. Verbal cueing for increased RLE step length. Activation of L gluteus maximus, quadriceps noted.  Transported pt to room in w/c secondary to pt fatigue. Performed stand pivot transfer (to L side) from w/c>bed with +2A,  manual facilitation of anterior weight shift and manual stabilization of L knee. Seated EOB, performed L scooting with Total A, manual facilitation as described for w/c>bed transfer. Sit>supine with +2A. Therapists departed with pt semi-reclined in bed with 3 bed rails up, bed alarm on, and all needs within reach.  Therapy Documentation Precautions:  Precautions Precautions: Fall;ICD/Pacemaker Precaution Comments: pacemaker; chronic back pain Restrictions Weight Bearing Restrictions: No Vital Signs: Therapy Vitals Temp: 98.1 F (36.7 C) Temp src: Oral Pulse Rate: 81 Resp: 18 BP: 142/82 mmHg Patient Position, if appropriate: Lying Oxygen Therapy SpO2: 97 % O2 Device: None (Room air) Pain: Pain Assessment Pain Assessment: No/denies pain  See FIM for current functional status  Therapy/Group: Co-Treatment  Morey Andonian A 01/18/2014, 5:05 PM

## 2014-01-18 NOTE — Progress Notes (Signed)
Speech Language Pathology Daily Session Note  Patient Details  Name: [name] MRN: [id number] Date of Birth: Apr 18, 1966  Today's Date: 01/18/2014 Time: 9470-9628 Time Calculation (min): 45 min  Short Term Goals: Week 1: SLP Short Term Goal 1 (Week 1): Pt will consume current diet with minimal overt s/s of aspiration with supervision verbal cues.  SLP Short Term Goal 2 (Week 1): Pt will demonstrate efficient mastication with trials of Dys. 3 textures with supervision verbal cues.  SLP Short Term Goal 3 (Week 1): Pt will demonstrate functional problem solving for complex tasks with supervision verbal cues.  SLP Short Term Goal 4 (Week 1): Pt will utilize speech intelligibility strategies at the sentence level with Mod I.  SLP Short Term Goal 5 (Week 1): Pt will perfrom lingual and labial oral-motor exercises with Min A verbal and visual cues.  SLP Short Term Goal 6 (Week 1): Pt will attend to left field of enviornment/body during functional tasks with mod I.   Skilled Therapeutic Interventions: Skilled treatment focused on swallowing and cognitive goals. SLP facilitated session with advanced trials of Dys 3 textures with thin liquids via straw. Pt demonstrated adequate mastication and oral clearance with supervision, and appears safe for advancement to Dys 3 textures as no overt s/s of aspiration were observed. Pt participated in complex conversation regarding medication management, with supervision level verbal cues for recall of new medications since admission. Pt recalled functions and dosage with Mod I. Pt verbalized her need for toileting with Mod I and demonstrated adequate problem solving and followed commands throughout with Mod I. Continue plan of care.   FIM:  Comprehension Comprehension Mode: Auditory Comprehension: 6-Follows complex conversation/direction: With extra time/assistive device Expression Expression Mode: Verbal Expression: 6-Expresses complex ideas: With extra  time/assistive device Social Interaction Social Interaction: 5-Interacts appropriately 90% of the time - Needs monitoring or encouragement for participation or interaction. Problem Solving Problem Solving: 5-Solves basic problems: With no assist Memory Memory: 6-More than reasonable amt of time FIM - Eating Eating Activity: 5: Set-up assist for open containers;5: Supervision/cues  Pain Pain Assessment Pain Assessment: No/denies pain  Therapy/Group: Individual Therapy   Maxcine Ham, M.A. CCC-SLP [phone]   Maxcine Ham 01/18/2014, 4:50 PM

## 2014-01-19 ENCOUNTER — Inpatient Hospital Stay (HOSPITAL_COMMUNITY): Payer: Medicaid Other | Admitting: Physical Therapy

## 2014-01-19 ENCOUNTER — Inpatient Hospital Stay (HOSPITAL_COMMUNITY): Payer: Medicaid Other | Admitting: Speech Pathology

## 2014-01-19 ENCOUNTER — Inpatient Hospital Stay (HOSPITAL_COMMUNITY): Payer: Medicaid Other

## 2014-01-19 DIAGNOSIS — I6350 Cerebral infarction due to unspecified occlusion or stenosis of unspecified cerebral artery: Secondary | ICD-10-CM

## 2014-01-19 DIAGNOSIS — E1149 Type 2 diabetes mellitus with other diabetic neurological complication: Secondary | ICD-10-CM

## 2014-01-19 DIAGNOSIS — E1142 Type 2 diabetes mellitus with diabetic polyneuropathy: Secondary | ICD-10-CM

## 2014-01-19 LAB — GLUCOSE, CAPILLARY
GLUCOSE-CAPILLARY: 237 mg/dL — AB (ref 70–99)
Glucose-Capillary: 197 mg/dL — ABNORMAL HIGH (ref 70–99)
Glucose-Capillary: 198 mg/dL — ABNORMAL HIGH (ref 70–99)
Glucose-Capillary: 233 mg/dL — ABNORMAL HIGH (ref 70–99)

## 2014-01-19 NOTE — Progress Notes (Signed)
Patient ID: [name], female   DOB: 1966/09/08, 48 y.o.   MRN: [id number]  Subjective/Complaints:  01/19/14.   48 y.o. right-handed female with history of uncontrolled hypertension, diabetes mellitus peripheral neuropathy, morbid obesity, nonischemic cardiomyopathy, chronic diastolic heart failure, CAD status post pacemaker placement and chronic back pain followed at outpatient rehabilitation services. Admitted 01/09/2014 with left facial weakness and slurred speech. Patient with previous admission 10/07/2013 with similar symptoms workup negative as per neurology services. Cranial CT scan consistent with possible right middle cerebral artery territory infarct. MRI not completed secondary to pacemaker. Patient did not receive TPA. CTA view of the head negative for hemorrhage did show occluded branch MCA vessel. CTA of the neck was negative.. Echocardiogram with ejection fraction of 40% no wall motion abnormalities.. TEE showed moderately depressed LVEF without thrombus or vegetation. . Currently maintained on aspirin for CVA prophylaxis. Subcutaneous heparin for DVT prophylaxis. Diet has been advanced to a regular consistency  No new complaints. Comfortable night  Review of Systems - Negative except back pain Objective: Vital Signs: Blood pressure 140/72, pulse 73, temperature 98 F (36.7 C), temperature source Oral, resp. rate 18, height 5\' 9"  (1.753 m), weight 274 lb 7.6 oz (124.5 kg), last menstrual period 12/21/2013, SpO2 96.00%. No results found. Results for orders placed during the hospital encounter of 01/15/14 (from the past 72 hour(s))  GLUCOSE, CAPILLARY     Status: Abnormal   Collection Time    01/16/14 11:54 AM      Result Value Range   Glucose-Capillary 233 (*) 70 - 99 mg/dL   Comment 1 Notify RN    GLUCOSE, CAPILLARY     Status: Abnormal   Collection Time    01/16/14  4:44 PM      Result Value Range   Glucose-Capillary 188 (*) 70 - 99 mg/dL   Comment 1 Notify RN     GLUCOSE, CAPILLARY     Status: Abnormal   Collection Time    01/16/14  8:31 PM      Result Value Range   Glucose-Capillary 206 (*) 70 - 99 mg/dL   Comment 1 Notify RN    GLUCOSE, CAPILLARY     Status: Abnormal   Collection Time    01/17/14  7:29 AM      Result Value Range   Glucose-Capillary 244 (*) 70 - 99 mg/dL   Comment 1 Notify RN    GLUCOSE, CAPILLARY     Status: Abnormal   Collection Time    01/17/14 11:12 AM      Result Value Range   Glucose-Capillary 272 (*) 70 - 99 mg/dL   Comment 1 Notify RN    GLUCOSE, CAPILLARY     Status: Abnormal   Collection Time    01/17/14  4:37 PM      Result Value Range   Glucose-Capillary 177 (*) 70 - 99 mg/dL  GLUCOSE, CAPILLARY     Status: Abnormal   Collection Time    01/17/14  9:18 PM      Result Value Range   Glucose-Capillary 210 (*) 70 - 99 mg/dL  GLUCOSE, CAPILLARY     Status: Abnormal   Collection Time    01/18/14  7:25 AM      Result Value Range   Glucose-Capillary 164 (*) 70 - 99 mg/dL  GLUCOSE, CAPILLARY     Status: Abnormal   Collection Time    01/18/14 11:13 AM      Result Value Range   Glucose-Capillary 199 (*)  70 - 99 mg/dL   Comment 1 Notify RN    GLUCOSE, CAPILLARY     Status: Abnormal   Collection Time    01/18/14  4:21 PM      Result Value Range   Glucose-Capillary 245 (*) 70 - 99 mg/dL   Comment 1 Notify RN    GLUCOSE, CAPILLARY     Status: Abnormal   Collection Time    01/18/14  9:33 PM      Result Value Range   Glucose-Capillary 213 (*) 70 - 99 mg/dL  GLUCOSE, CAPILLARY     Status: Abnormal   Collection Time    01/19/14  7:24 AM      Result Value Range   Glucose-Capillary 197 (*) 70 - 99 mg/dL    Patient Vitals for the past 24 hrs:  BP Temp Temp src Pulse Resp SpO2  01/19/14 0618 140/72 mmHg 98 F (36.7 C) Oral 73 18 96 %  01/18/14 1436 142/82 mmHg 98.1 F (36.7 C) Oral 81 18 97 %     Intake/Output Summary (Last 24 hours) at 01/19/14 7342 Last data filed at 01/18/14 2100  Gross per 24 hour   Intake    480 ml  Output    701 ml  Net   -221 ml    CBG (last 3)   Recent Labs  01/18/14 1621 01/18/14 2133 01/19/14 0724  GLUCAP 245* 213* 197*     HEENT:neg Cardio: RRR and no murmur Resp: CTA B/L and unlabored GI: BS positive and non distended Extremity:  Pulses positive and No Edema Skin:   Intact Neuro: Alert/Oriented, dense L HP Musc/Skel:  Other back pain  Gen NAD obese   Assessment/Plan:  Medical Problem List and Plan:  1. Cardioembolic right MCA infarct, left hemiparesis and LUE sensory def  2. Nonischemic cardiomyopathy, CAD with pacemaker, placed Sept 2014. No chest pain or shortness of breath  3. Diabetes mellitus with peripheral neuropathy. Hemoglobin A1c 10.7.Increased Lantus insulin to 16 units each bedtime on Thursday. Follow for pattern  4. Hypertension. Toprol-XL 25 mg twice a day, Lanoxin 0.125 mg daily. Monitor with increased activity. Recent blood pressure meds held due to some hypotension we'll resume as tolerated  5.  Chronic diastolic heart failure. Monitor for any signs of fluid overload. Daily i's and o's and weights. Patient on Lasix 40 mg twice a day prior to admission. Resume lasix at 40mg  for now.  -recheck BMET on Monday 6. Hyperlipidemia. Lipitor   LOS (Days) 4 A FACE TO FACE EVALUATION WAS PERFORMED  Monday 01/19/2014, 9:01 AM

## 2014-01-19 NOTE — Progress Notes (Signed)
Occupational Therapy Session Note  Patient Details  Name: [name] MRN: [id number] Date of Birth: [birth year]  Today's Date: 01/19/2014 Time:  0800-0858 Time calculation (min): 58 min  Short Term Goals: Week 1:  OT Short Term Goal 1 (Week 1): Patient will complete sit<>stand during self-care task with max assist  OT Short Term Goal 2 (Week 1): Patient will complete squat pivot transfer total assist +1 OT Short Term Goal 3 (Week 1): Pt will complete UB dressing with mod assist OT Short Term Goal 4 (Week 1): Pt will complete toilet task with total assist +1  Skilled Therapeutic Interventions/Progress Updates:    Pt seen for ADL retraining with focus on bed mobility, attention to left, functional transfers, and postural control in sitting. Pt received supine in bed. Completed peri hygiene while supine and buttocks hygiene with assist and min-mod assist for rolling. Completed dressing sitting EOB with supervision for sitting balance and min cueing for hemi dressing technique. Completed sit<>stand x3 with max assist and using w/c for support in standing. Completed stand pivot transfer bed>w/c with max assist +1 (other staff member present for safety). Engaged in self-feeding task with min cueing for safe swallowing techniques.Provided PROM and light stretching to LUE. At end of session pt left sitting in w/c with QRB donned and lap tray placed. All items in reach.   Therapy Documentation Precautions:  Precautions Precautions: Fall;ICD/Pacemaker Precaution Comments: pacemaker; chronic back pain Restrictions Weight Bearing Restrictions: No General:   Vital Signs: Therapy Vitals Temp: 98 F (36.7 C) Temp src: Oral Pulse Rate: 73 Resp: 18 BP: 140/72 mmHg Patient Position, if appropriate: Lying Oxygen Therapy SpO2: 96 % O2 Device: None (Room air);Nasal cannula O2 Flow Rate (L/min): 2 L/min Pain: No report of pain during therapy session.   See FIM for current functional  status  Therapy/Group: Individual Therapy  Jong Rickman, Vara Guardian 01/19/2014, 8:40 AM

## 2014-01-19 NOTE — Discharge Summary (Signed)
TEE done on the day prior to transfer to CIR revealed hyperechoic masses on pacemaker leads in the SVC and RA; likely thrombotic and less likely infectious.  We will ask permission from the CIR attendings to continue evaluation of these lesions with Cardiology and Neurology consultations while patient is receiving rehabilitation on CIR as this could be the source for her CVA and require some intervention to address these lesions to avoid recurrence of a CVA.  If they prefer, we would be happy to take her back on our service to complete this evaluation.

## 2014-01-19 NOTE — Progress Notes (Signed)
[name] was discharged from the internal medicine teaching service to CIR on 01/15/14. A TEE was performed prior to discharge to CIR which did show pacemaker leads in the distal SVC and right atrium, one of which has hyperechoic material with small mobile portions thought to be thrombotic in nature. However, with no left to right shunt, they were not felt to be the cause of embolic stroke. The patient was discharged to CIR shortly after the TEE was read. Per Neurology notes, the pacemaker lead thrombi was thought to be incidental and not the cause of the stroke.   However, given that there is the potential for this to cause a CVA, I spoke to the on call attending following [name] in CIR to make sure these lesions were continued to be monitored with Cardiology and Neurology consultations. I offered to take [name] back on our service to facilitate this; however, the attending in CIR, Dr. Lesia Hausen felt that the patient could be managed in CIR. He recommended consulting Cardiology regarding these lesions to make sure that they were not the origin of thrombosis causing her CVA, but he did not feel that Neurology consultation was warranted at this time.   I spoke to Dr. Shirlee Latch, who is on call for Cardiology today, and he felt that there was nothing further that needed to be done for the patient. She is on 325mg  ASA, and he did not feel that she needed to be on further anticoagulation. He did recommended touching base with her Cardiologist, Dr. on Monday.   Our service will continue to follow the patient and will contact Dr. Tuesday on Monday to further discuss the TEE results.

## 2014-01-19 NOTE — Progress Notes (Signed)
Speech Language Pathology Daily Session Note  Patient Details  Name: ELLISE KOVACK MRN: [id number] Date of Birth: 1966-06-06  Today's Date: 01/19/2014 Time: 4496-7591 Time Calculation (min): 30 min  Short Term Goals: Week 1: SLP Short Term Goal 1 (Week 1): Pt will consume current diet with minimal overt s/s of aspiration with supervision verbal cues.  SLP Short Term Goal 2 (Week 1): Pt will demonstrate efficient mastication with trials of Dys. 3 textures with supervision verbal cues.  SLP Short Term Goal 3 (Week 1): Pt will demonstrate functional problem solving for complex tasks with supervision verbal cues.  SLP Short Term Goal 4 (Week 1): Pt will utilize speech intelligibility strategies at the sentence level with Mod I.  SLP Short Term Goal 5 (Week 1): Pt will perfrom lingual and labial oral-motor exercises with Min A verbal and visual cues.  SLP Short Term Goal 6 (Week 1): Pt will attend to left field of enviornment/body during functional tasks with mod I.   Skilled Therapeutic Interventions: Skilled intervention complete, with short term goals addressed.  Patient required moderate verbal and tactile cues to complete oral motor exercises.  She stated that she felt she was not getting any better, and discussion regarding continued dedication to therapy is important and requires long-term commitment.  Continue with current treatment plan.    FIM:  Comprehension Comprehension Mode: Auditory Expression Expression Mode: Verbal Social Interaction Social Interaction: 5-Interacts appropriately 90% of the time - Needs monitoring or encouragement for participation or interaction. Problem Solving Problem Solving: 5-Solves basic 90% of the time/requires cueing < 10% of the time  Pain Pain Assessment Pain Assessment: No/denies pain  Therapy/Group: Individual Therapy  Lenny Pastel 01/19/2014, 3:25 PM

## 2014-01-19 NOTE — Progress Notes (Signed)
Resp care Note;Pt refusing to wear Cpap tonight,just wants to wear 2lpm via nasal cannula.

## 2014-01-19 NOTE — Progress Notes (Addendum)
Physical Therapy Note  Patient Details  Name: [name] MRN: [id number] Date of Birth: [birth year] Today's Date: 01/19/2014  1000-1055 (55 minutes) individual Pain: no reported pain Focus of treatment: wc mobility training/ activity tolerance; Neuro re -ed LT LE; sit to stand / standing to facilitate weight bearing LT LE Treatment : wc mobility 120 feet X 2 min to SBA using RT extremities with increased time; wc setup for transfer vcs for left armrest ; max assist for scoot to left + second person to secure wc; sit to supine mod assist ; rolling to right min assist (mat); Neuro re-ed left LE- AA hip flexion / extension post quick stretch hip flexors in sidelying (gravity eliminated) with minimal initiation of active hip flexion; supine to side to sit mod assist left LE/trunk; standing x 4 in STEDY to facilitate weight bearing left LE ( 2 minutes or less); scoot transfer mat to wc max assist + second person to secure wc; returned to room with all needs within reach.    1400 - 1440 (40 minutes) individual Pain: no reported pain Focus of treatment: wc mobility training/ activity tolerance; pregait activities in standing to improve weight bearing left LE; transfer training Treatment: Pt in bed upon arrival; supine to side to sit (bed) mod assist; transfer bed to wc scoot to squat/pivot mod/max assist with second person to secure wc; wc >< mat as above; standing to rail right mod assist ; pt unable to advance RT LE secondary to decreased knee control on left; wc mobility 120 feet using rt. Extremities X 2 SBA with increased time; returned to room with all needs within reach.   [name],JIM 01/19/2014, 10:32 AM

## 2014-01-20 DIAGNOSIS — I10 Essential (primary) hypertension: Secondary | ICD-10-CM

## 2014-01-20 LAB — GLUCOSE, CAPILLARY
GLUCOSE-CAPILLARY: 278 mg/dL — AB (ref 70–99)
GLUCOSE-CAPILLARY: 206 mg/dL — AB (ref 70–99)
Glucose-Capillary: 237 mg/dL — ABNORMAL HIGH (ref 70–99)

## 2014-01-20 NOTE — Progress Notes (Signed)
Mrs. Hernan seems to be doing well overall. We will plan on contacting her Cardiologist, Dr. Graciela Husbands, tomorrow morning to notify him of the pacemaker lead thrombus to see if he would like for her to be on any anticoagulation aside from her ASA 325mg  daily.

## 2014-01-20 NOTE — Progress Notes (Signed)
Patient ID: [name], female   DOB: 1966/08/02, 48 y.o.   MRN: [id number]  Patient ID: [name], female   DOB: 1966/10/09, 48 y.o.   MRN: [id number]  Subjective/Complaints:  01/20/14.   48 y.o. right-handed female with history of uncontrolled hypertension, diabetes mellitus peripheral neuropathy, morbid obesity, nonischemic cardiomyopathy, chronic diastolic heart failure, CAD status post pacemaker placement and chronic back pain followed at outpatient rehabilitation services. Admitted 01/09/2014 with left facial weakness and slurred speech. Patient with previous admission 10/07/2013 with similar symptoms workup negative as per neurology services. Cranial CT scan consistent with possible right middle cerebral artery territory infarct. MRI not completed secondary to pacemaker. Patient did not receive TPA. CTA view of the head negative for hemorrhage did show occluded branch MCA vessel. CTA of the neck was negative.. Echocardiogram with ejection fraction of 40% no wall motion abnormalities..   TEE showed moderately depressed LVEF with  RA thrombus associated with pacemaker lead.  Currently maintained on aspirin for CVA prophylaxis. This was discussed with cardiology yesterday (01/19/14)  and further anticoagulation felt not to be indicated.  Subcutaneous heparin for DVT prophylaxis. Diet has been advanced to a regular consistency  No new complaints. Comfortable night  Review of Systems - Negative except back pain Objective: Vital Signs: Blood pressure 132/84, pulse 83, temperature 97 F (36.1 C), temperature source Oral, resp. rate 18, height 5\' 9"  (1.753 m), weight 274 lb 7.6 oz (124.5 kg), last menstrual period 12/21/2013, SpO2 100.00%. No results found. Results for orders placed during the hospital encounter of 01/15/14 (from the past 72 hour(s))  GLUCOSE, CAPILLARY     Status: Abnormal   Collection Time    01/17/14 11:12 AM      Result Value Range   Glucose-Capillary 272 (*) 70 - 99  mg/dL   Comment 1 Notify RN    GLUCOSE, CAPILLARY     Status: Abnormal   Collection Time    01/17/14  4:37 PM      Result Value Range   Glucose-Capillary 177 (*) 70 - 99 mg/dL  GLUCOSE, CAPILLARY     Status: Abnormal   Collection Time    01/17/14  9:18 PM      Result Value Range   Glucose-Capillary 210 (*) 70 - 99 mg/dL  GLUCOSE, CAPILLARY     Status: Abnormal   Collection Time    01/18/14  7:25 AM      Result Value Range   Glucose-Capillary 164 (*) 70 - 99 mg/dL  GLUCOSE, CAPILLARY     Status: Abnormal   Collection Time    01/18/14 11:13 AM      Result Value Range   Glucose-Capillary 199 (*) 70 - 99 mg/dL   Comment 1 Notify RN    GLUCOSE, CAPILLARY     Status: Abnormal   Collection Time    01/18/14  4:21 PM      Result Value Range   Glucose-Capillary 245 (*) 70 - 99 mg/dL   Comment 1 Notify RN    GLUCOSE, CAPILLARY     Status: Abnormal   Collection Time    01/18/14  9:33 PM      Result Value Range   Glucose-Capillary 213 (*) 70 - 99 mg/dL  GLUCOSE, CAPILLARY     Status: Abnormal   Collection Time    01/19/14  7:24 AM      Result Value Range   Glucose-Capillary 197 (*) 70 - 99 mg/dL  GLUCOSE, CAPILLARY     Status:  Abnormal   Collection Time    01/19/14 11:23 AM      Result Value Range   Glucose-Capillary 237 (*) 70 - 99 mg/dL   Comment 1 Notify RN    GLUCOSE, CAPILLARY     Status: Abnormal   Collection Time    01/19/14  4:16 PM      Result Value Range   Glucose-Capillary 198 (*) 70 - 99 mg/dL   Comment 1 Notify RN    GLUCOSE, CAPILLARY     Status: Abnormal   Collection Time    01/19/14  9:29 PM      Result Value Range   Glucose-Capillary 233 (*) 70 - 99 mg/dL  GLUCOSE, CAPILLARY     Status: Abnormal   Collection Time    01/20/14  7:29 AM      Result Value Range   Glucose-Capillary 237 (*) 70 - 99 mg/dL    Patient Vitals for the past 24 hrs:  BP Temp Temp src Pulse Resp SpO2  01/20/14 0620 132/84 mmHg 97 F (36.1 C) Oral 83 18 100 %  01/19/14 2036  161/92 mmHg - - 95 - -  01/19/14 1527 142/83 mmHg 97.9 F (36.6 C) Oral 86 18 98 %     Intake/Output Summary (Last 24 hours) at 01/20/14 2992 Last data filed at 01/20/14 4268  Gross per 24 hour  Intake    720 ml  Output    300 ml  Net    420 ml    CBG (last 3)   Recent Labs  01/19/14 1616 01/19/14 2129 01/20/14 0729  GLUCAP 198* 233* 237*     HEENT:neg Cardio: RRR and no murmur Resp: CTA B/L and unlabored GI: BS positive and non distended Extremity:  Pulses positive and No Edema Skin:   Intact Neuro: Alert/Oriented, dense L HP Musc/Skel:  Other back pain  Gen NAD obese   Assessment/Plan:  Medical Problem List and Plan:  1. Cardioembolic right MCA infarct, left hemiparesis and LUE sensory def  2. Nonischemic cardiomyopathy, CAD with pacemaker, placed Sept 2014. No chest pain or shortness of breath  3. Diabetes mellitus with peripheral neuropathy. Hemoglobin A1c 10.7.Increased Lantus insulin to 16 units each bedtime on Thursday. Follow for pattern  4. Hypertension. Toprol-XL 25 mg twice a day, Lanoxin 0.125 mg daily. Monitor with increased activity. Recent blood pressure meds held due to some hypotension we'll resume as tolerated  5.  Chronic diastolic heart failure. Monitor for any signs of fluid overload. Daily i's and o's and weights. Patient on Lasix 40 mg twice a day prior to admission. Resume lasix at 40mg  for now.  -recheck BMET on Monday 6. Hyperlipidemia. Lipitor 7.  Incidental RA thrombus.    LOS (Days) 5 A FACE TO FACE EVALUATION WAS PERFORMED  Thursday 01/20/2014, 8:12 AM

## 2014-01-20 NOTE — Progress Notes (Signed)
Pt. Continues to be incontinent of bladder with menses.  Timed toileting recommended Q2-3 hrs.  Patient continues to be a high fall risk.  Continue to follow safety plan guidelines to ensure safety.  Patient back in bed this afternoon and bed alarm set; call bell in reach.  Visitors at patient bedside.

## 2014-01-21 ENCOUNTER — Inpatient Hospital Stay (HOSPITAL_COMMUNITY): Payer: Medicaid Other

## 2014-01-21 ENCOUNTER — Encounter: Payer: Medicaid Other | Admitting: Obstetrics & Gynecology

## 2014-01-21 ENCOUNTER — Inpatient Hospital Stay (HOSPITAL_COMMUNITY): Payer: Medicaid Other | Admitting: Speech Pathology

## 2014-01-21 ENCOUNTER — Inpatient Hospital Stay (HOSPITAL_COMMUNITY): Payer: Medicaid Other | Admitting: Physical Therapy

## 2014-01-21 DIAGNOSIS — E1149 Type 2 diabetes mellitus with other diabetic neurological complication: Secondary | ICD-10-CM

## 2014-01-21 DIAGNOSIS — G8110 Spastic hemiplegia affecting unspecified side: Secondary | ICD-10-CM

## 2014-01-21 DIAGNOSIS — I10 Essential (primary) hypertension: Secondary | ICD-10-CM

## 2014-01-21 DIAGNOSIS — I6340 Cerebral infarction due to embolism of unspecified cerebral artery: Secondary | ICD-10-CM

## 2014-01-21 DIAGNOSIS — I5032 Chronic diastolic (congestive) heart failure: Secondary | ICD-10-CM

## 2014-01-21 DIAGNOSIS — I428 Other cardiomyopathies: Secondary | ICD-10-CM

## 2014-01-21 LAB — GLUCOSE, CAPILLARY
GLUCOSE-CAPILLARY: 205 mg/dL — AB (ref 70–99)
GLUCOSE-CAPILLARY: 248 mg/dL — AB (ref 70–99)
Glucose-Capillary: 198 mg/dL — ABNORMAL HIGH (ref 70–99)
Glucose-Capillary: 214 mg/dL — ABNORMAL HIGH (ref 70–99)
Glucose-Capillary: 185 mg/dL — ABNORMAL HIGH (ref 70–99)

## 2014-01-21 LAB — BASIC METABOLIC PANEL
BUN: 11 mg/dL (ref 6–23)
CALCIUM: 9.4 mg/dL (ref 8.4–10.5)
CO2: 28 meq/L (ref 19–32)
CREATININE: 0.71 mg/dL (ref 0.50–1.10)
Chloride: 97 mEq/L (ref 96–112)
GFR calc Af Amer: >90 mL/min (ref >90)
GFR calc non Af Amer: >90 mL/min (ref >90)
Glucose, Bld: 194 mg/dL — ABNORMAL HIGH (ref 70–99)
Potassium: 3.7 mEq/L (ref 3.7–5.3)
Sodium: 138 mEq/L (ref 137–147)

## 2014-01-21 MED ORDER — BETHANECHOL CHLORIDE 25 MG PO TABS
25.0000 mg | ORAL_TABLET | Freq: Three times a day (TID) | ORAL | Status: DC
  Administered 2014-01-21 – 2014-01-27 (×21): 25 mg via ORAL
  Filled 2014-01-21 (×25): qty 1

## 2014-01-21 MED ORDER — TIZANIDINE HCL 2 MG PO TABS
2.0000 mg | ORAL_TABLET | Freq: Every day | ORAL | Status: DC
  Administered 2014-01-21 – 2014-01-23 (×3): 2 mg via ORAL
  Filled 2014-01-21 (×4): qty 1

## 2014-01-21 NOTE — Plan of Care (Signed)
Problem: RH BLADDER ELIMINATION Goal: RH STG MANAGE BLADDER WITH ASSISTANCE STG Manage Bladder With mod Assistance  Outcome: Not Progressing incont of bladder with retaining urine. I&O cath q8hrs when no void or >369ml

## 2014-01-21 NOTE — Progress Notes (Signed)
Resp Care Note:Pt doesn't want to wear Cpap at night prefers to just wear nasal cannula at 2lpm.

## 2014-01-21 NOTE — Progress Notes (Signed)
Subjective/Complaints: 48 y.o. right-handed female with history of uncontrolled hypertension, diabetes mellitus peripheral neuropathy, morbid obesity, nonischemic cardiomyopathy, chronic diastolic heart failure, CAD status post pacemaker placement and chronic back pain followed at outpatient rehabilitation services. Admitted 01/09/2014 with left facial weakness and slurred speech. Patient with previous admission 10/07/2013 with similar symptoms workup negative as per neurology services. Cranial CT scan consistent with possible right middle cerebral artery territory infarct. MRI not completed secondary to pacemaker. Patient did not receive TPA. CTA view of the head negative for hemorrhage did show occluded branch MCA vessel. CTA of the neck was negative.. Echocardiogram with ejection fraction of 40% no wall motion abnormalities.. TEE showed moderately depressed LVEF without thrombus or vegetation. . Currently maintained on aspirin for CVA prophylaxis. Subcutaneous heparin for DVT prophylaxis. Diet has been advanced to a regular consistency  Wants to get OOB  Review of Systems - Negative except back pain Objective: Vital Signs: Blood pressure 125/83, pulse 89, temperature 97.5 F (36.4 C), temperature source Oral, resp. rate 18, height 5\' 9"  (1.753 m), weight 124.5 kg (274 lb 7.6 oz), last menstrual period 12/21/2013, SpO2 100.00%. No results found. Results for orders placed during the hospital encounter of 01/15/14 (from the past 72 hour(s))  GLUCOSE, CAPILLARY     Status: Abnormal   Collection Time    01/18/14 11:13 AM      Result Value Range   Glucose-Capillary 199 (*) 70 - 99 mg/dL   Comment 1 Notify RN    GLUCOSE, CAPILLARY     Status: Abnormal   Collection Time    01/18/14  4:21 PM      Result Value Range   Glucose-Capillary 245 (*) 70 - 99 mg/dL   Comment 1 Notify RN    GLUCOSE, CAPILLARY     Status: Abnormal   Collection Time    01/18/14  9:33 PM      Result Value Range   Glucose-Capillary 213 (*) 70 - 99 mg/dL  GLUCOSE, CAPILLARY     Status: Abnormal   Collection Time    01/19/14  7:24 AM      Result Value Range   Glucose-Capillary 197 (*) 70 - 99 mg/dL  GLUCOSE, CAPILLARY     Status: Abnormal   Collection Time    01/19/14 11:23 AM      Result Value Range   Glucose-Capillary 237 (*) 70 - 99 mg/dL   Comment 1 Notify RN    GLUCOSE, CAPILLARY     Status: Abnormal   Collection Time    01/19/14  4:16 PM      Result Value Range   Glucose-Capillary 198 (*) 70 - 99 mg/dL   Comment 1 Notify RN    GLUCOSE, CAPILLARY     Status: Abnormal   Collection Time    01/19/14  9:29 PM      Result Value Range   Glucose-Capillary 233 (*) 70 - 99 mg/dL  GLUCOSE, CAPILLARY     Status: Abnormal   Collection Time    01/20/14  7:29 AM      Result Value Range   Glucose-Capillary 237 (*) 70 - 99 mg/dL  GLUCOSE, CAPILLARY     Status: Abnormal   Collection Time    01/20/14 12:08 PM      Result Value Range   Glucose-Capillary 278 (*) 70 - 99 mg/dL  GLUCOSE, CAPILLARY     Status: Abnormal   Collection Time    01/20/14  4:46 PM      Result Value  Range   Glucose-Capillary 206 (*) 70 - 99 mg/dL  GLUCOSE, CAPILLARY     Status: Abnormal   Collection Time    01/20/14  9:28 PM      Result Value Range   Glucose-Capillary 205 (*) 70 - 99 mg/dL   Comment 1 Notify RN    GLUCOSE, CAPILLARY     Status: Abnormal   Collection Time    01/21/14  7:05 AM      Result Value Range   Glucose-Capillary 198 (*) 70 - 99 mg/dL   Comment 1 Notify RN       HEENT: white coating on tongue Cardio: RRR and no murmur Resp: CTA B/L and unlabored GI: BS positive and non distended Extremity:  Pulses positive and No Edema Skin:   Intact Neuro: Alert/Oriented, Abnormal Sensory absent to LT LUE and intact LLE, Abnormal Motor 0/5 in LUE and LLE and Other no evidence of L field cut Musc/Skel:  Other back pain  Gen NAD   Assessment/Plan: 1. Functional deficits secondary to Cardioembolic R MCA  infarct which require 3+ hours per day of interdisciplinary therapy in a comprehensive inpatient rehab setting. Physiatrist is providing close team supervision and 24 hour management of active medical problems listed below. Physiatrist and rehab team continue to assess barriers to discharge/monitor patient progress toward functional and medical goals. FIM: FIM - Bathing Bathing Steps Patient Completed: Front perineal area (washed peri area only) Bathing: 3: Mod-Patient completes 5-7 26f 10 parts or 50-74%  FIM - Upper Body Dressing/Undressing Upper body dressing/undressing steps patient completed: Thread/unthread right sleeve of front closure shirt/dress Upper body dressing/undressing: 2: Max-Patient completed 25-49% of tasks FIM - Lower Body Dressing/Undressing Lower body dressing/undressing steps patient completed: Thread/unthread right pants leg Lower body dressing/undressing: 1: Total-Patient completed less than 25% of tasks     FIM - Diplomatic Services operational officer Devices: Psychiatrist Transfers: 1-Two helpers  FIM - Banker Devices: Arm rests Bed/Chair Transfer: 2: Supine > Sit: Max A (lifting assist/Pt. 25-49%);2: Bed > Chair or W/C: Max A (lift and lower assist)  FIM - Locomotion: Wheelchair Distance: 85 Locomotion: Wheelchair: 2: Travels 50 - 149 ft with maximal assistance (Pt: 25 - 49%) FIM - Locomotion: Ambulation Locomotion: Ambulation Assistive Devices: Other (comment) (R hallway hand rail) Ambulation/Gait Assistance: 1: +2 Total assist (w/c follow) Locomotion: Ambulation: 1: Two helpers  Comprehension Comprehension Mode: Auditory Comprehension: 6-Follows complex conversation/direction: With extra time/assistive device  Expression Expression Mode: Verbal Expression: 6-Expresses complex ideas: With extra time/assistive device  Social Interaction Social Interaction: 5-Interacts appropriately 90% of the  time - Needs monitoring or encouragement for participation or interaction.  Problem Solving Problem Solving: 5-Solves basic 90% of the time/requires cueing < 10% of the time  Memory Memory: 6-Assistive device: No helper  Medical Problem List and Plan:  1. Cardioembolic right MCA infarct, left hemiparesis and LUE sensory def  2. DVT Prophylaxis/Anticoagulation: Subcutaneous heparin. Monitor platelet counts and any signs of bleeding  3. Pain Management/chronic pain syndrome: Tegretol 200 mg twice a day for neuropathic pain. Patient also on Flexeril prior to admission but held due to some lethargy.  4. Neuropsych: This patient is capable of making decisions on her own behalf.  5. Nonischemic cardiomyopathy, CAD with pacemaker, placed Sept 2014. No chest pain or shortness of breath  6. Diabetes mellitus with peripheral neuropathy. Hemoglobin A1c 10.7.Increase Lantus insulin 16 units each bedtime.titrate up Check blood sugars a.c. and at bedtime  7. Hypertension. Toprol-XL  25 mg twice a day, Lanoxin 0.125 mg daily. Monitor with increased activity. Recent blood pressure meds held due to some hypotension we'll resume as tolerated  8. Chronic diastolic heart failure. Monitor for any signs of fluid overload. Daily i's and o's and weights. Patient on Lasix 40 mg twice a day prior to admission. Consider resuming back as needed monitor fluid intake 9. Hyperlipidemia. Lipitor 10.  Spasticity start zanaflex at noc 11.  Urinary retention- add urecholine  LOS (Days) 6 A FACE TO FACE EVALUATION WAS PERFORMED  KIRSTEINS,ANDREW E 01/21/2014, 8:29 AM

## 2014-01-21 NOTE — Progress Notes (Signed)
Inpatient Diabetes Program Recommendations  AACE/ADA: New Consensus Statement on Inpatient Glycemic Control (2013)  Target Ranges:  Prepandial:   less than 140 mg/dL      Peak postprandial:   less than 180 mg/dL (1-2 hours)      Critically ill patients:  140 - 180 mg/dL   Reason for Visit: Hyperglycemia  Diabetes history: Type 2 Outpatient Diabetes medications: Lantus 18 units QHS Current orders for Inpatient glycemic control: Lantus 16 units QHS and Novolog sensitive tidwc  Inpatient Diabetes Program Recommendations Insulin - Basal: Increase Lantus to 18 units QHS Correction (SSI): Increase Novolog to moderate tidwc and hs Insulin - Meal Coverage: Add Novolog 4 units tidwc if pt eats >50% meal  Will continue to follow. Thank you. Ailene Ards, RD, LDN, CDE Inpatient Diabetes Coordinator [phone]

## 2014-01-21 NOTE — Progress Notes (Signed)
Patient states that she does not want to wear her CPAP tonight and instead wants to wear her nasal cannula.   RN is aware and said that she will place patient on her nasal cannula tonight.

## 2014-01-21 NOTE — Progress Notes (Signed)
Occupational Therapy Session Note  Patient Details  Name: [name] MRN: [id number] Date of Birth: 06/08/1966  Today's Date: 01/21/2014 Time: 0865-7846 Time Calculation (min): 50 min  Short Term Goals: Week 1:  OT Short Term Goal 1 (Week 1): Patient will complete sit<>stand during self-care task with max assist  OT Short Term Goal 2 (Week 1): Patient will complete squat pivot transfer total assist +1 OT Short Term Goal 3 (Week 1): Pt will complete UB dressing with mod assist OT Short Term Goal 4 (Week 1): Pt will complete toilet task with total assist +1  Skilled Therapeutic Interventions/Progress Updates:    Pt seen for ADL retraining with focus on bed mobility, sit<>stand, postural control in standing, and functional transfers. Session focused on hemi dressing techniques while sitting EOB. Pt supervision for sitting balance with min guard for balance when reaching towards feet. Pt with recall of hemi dressing technique and good problem solving skills for placement of RLE on bed when donning sock with min instructional cues. Completed sit<>stand x4 with max assist. Pt required +2 assist for clothing management around waist d/t tight clothing. Completed stand pivot transfers max assist +1 bed>w/c with total assist for advancement of LLE. Pt with tone in LUE and practiced self-ROM exercises. Completed standing at sink with focus on postural control using mirror for visual feedback and swaying hips L and R for weight shifting. At end of session pt left sitting in w/c with all needs in reach. Pt with bright affect this AM.  Therapy Documentation Precautions:  Precautions Precautions: Fall;ICD/Pacemaker Precaution Comments: pacemaker; chronic back pain Restrictions Weight Bearing Restrictions: No General: General Amount of Missed OT Time (min): 10 Minutes Vital Signs:   Pain: Pt with report of back pain and had received pain meds prior to session.    See FIM for current functional  status  Therapy/Group: Individual Therapy  Daneil Dan 01/21/2014, 12:23 PM

## 2014-01-21 NOTE — Progress Notes (Signed)
I spoke with Dr. Graciela Husbands (cardiology) this morning regarding Ms. 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  She has no shunt and he agrees that the pacemaker lead thrombi were not the cause of the stroke.  He notes that she had 15 minutes of AFib recorded by pacemaker in September.  He will arrange interrogation of the pacemaker while she is in CIR.  If she has Afib she will need anticoagulation.  Evelena Peat, DO PGY-1 Internal Medicine Teaching Service [phone]

## 2014-01-21 NOTE — Progress Notes (Signed)
Speech Language Pathology Daily Session Notes  Patient Details  Name: [name] MRN: [id number] Date of Birth: [birth year]  Today's Date: 01/21/2014  Session 1 Time: 3338-3291 Time Calculation (min): 20 min  Session 2 Time: 1400-1445 Time Calculation: 45 min   Short Term Goals: Week 1: SLP Short Term Goal 1 (Week 1): Pt will consume current diet with minimal overt s/s of aspiration with supervision verbal cues.  SLP Short Term Goal 2 (Week 1): Pt will demonstrate efficient mastication with trials of Dys. 3 textures with supervision verbal cues.  SLP Short Term Goal 3 (Week 1): Pt will demonstrate functional problem solving for complex tasks with supervision verbal cues.  SLP Short Term Goal 4 (Week 1): Pt will utilize speech intelligibility strategies at the sentence level with Mod I.  SLP Short Term Goal 5 (Week 1): Pt will perfrom lingual and labial oral-motor exercises with Min A verbal and visual cues.  SLP Short Term Goal 6 (Week 1): Pt will attend to left field of enviornment/body during functional tasks with mod I.   Skilled Therapeutic Interventions:  Session 1: Pt participated in co-treatment with OT in diners club with focus on dysphagia and self-feeding goals.  Pt consumed limited PO intake of Dys. 3 textures with thin liquids but was Mod I for clearing oral cavity and self-monitoring and correcting left pocketing and required supervision visual cues to self-monitor and correct left anterior spillage. Pt consumed meal without overt s/s of aspiration. Pt also initiated social interaction with other group members with mod I.    Session 2: Treatment focus on cognitive goals. SLP facilitated session by providing Min verbal and question cues for complex problem solving and organization with a complex money management task. Pt recalled events from morning therapy sessions with Mod I and was Mod I for recall of current medications and their functions. Pt was also Mod I for wheelchair  mobility in a mildly distracting environment. Continue with current plan of care.   FIM:  Comprehension Comprehension Mode: Auditory Comprehension: 6-Follows complex conversation/direction: With extra time/assistive device Expression Expression: 6-Expresses complex ideas: With extra time/assistive device Social Interaction Social Interaction: 5-Interacts appropriately 90% of the time - Needs monitoring or encouragement for participation or interaction. Problem Solving Problem Solving: 5-Solves complex 90% of the time/cues < 10% of the time Memory Memory: 5-Requires cues to use assistive device FIM - Eating Eating Activity: 6: Swallowing techniques: self-managed;5: Set-up assist for open containers  Pain Pain Assessment Pain Assessment: No/denies pain   Therapy/Group: Individual Therapy and Group Therapy  [name] 01/21/2014, 4:18 PM

## 2014-01-21 NOTE — Progress Notes (Signed)
Physical Therapy Session Note  Patient Details  Name: [name] MRN: [id number] Date of Birth: 1966-04-12  Today's Date: 01/21/2014 Time: 1003-1101 Time Calculation (min): 58 min (Co-tx with AH, PT)  Short Term Goals: Week 1:  PT Short Term Goal 1 (Week 1): Pt will perform sit>supine with max A using bed rail with HOB flat. PT Short Term Goal 2 (Week 1): Pt will perform supine> sit with mod A using bed rail with HOB flat. PT Short Term Goal 3 (Week 1): Pt will perform bed<>chair transfer with total A of single therapist. PT Short Term Goal 4 (Week 1): Pt will perform w/c mobility x50' in controlled environment with mod A. PT Short Term Goal 5 (Week 1): Pt will perform static sitting x60 seconds without UE support requiring supervision.  Skilled Therapeutic Interventions/Progress Updates:    Skilled PT co-tx with AH, PT. Pt received seated on toilet with RN and CNA present. Pt agreeable to therapy. Session focused on increasing stability/independence with functional transfers, gait, and w/c mobility. Stand pivot transfer from toilet>w/c with max A using grab bars with manual stabilization of L knee, manual facilitation of anterior weight shift. Self-propulsion of w/c using R hemi-technique x95' in controlled environment with supervision. Pt with improved RLE weightbearing, increased accuracy of steering using RLE. Squat pivot transfer from w/c>mat table with max A, manual stabilization of L knee; initial 3 attempts with poor pt initiation of anterior weight shift/lifting of buttocks from w/c seat. See below for detailed description of NMR performed with pt seated EOM.   Pt performed gait x28' in controlled environment with +2A (3 musketeers assist) with w/c follow. Therapists positioned under bilat UE's providing manual facilitation of the following: anterior weight shift, L pelvic elevation, LLE advancement, manual stabilization of L knee during LLE stance, L weight shift prior to RLE  advancement, and tactile cueing at L gluteus maximus during RLE advancement. Verbal cueing provided to promote R weight shift prior to LLE advancement. Transported pt to room in w/c secondary to pt fatigue. Therapists departed with pt seated in w/c with lap tray on and all needs within reach.  Therapy Documentation Precautions:  Precautions Precautions: Fall;ICD/Pacemaker Precaution Comments: pacemaker; chronic back pain Restrictions Weight Bearing Restrictions: No Pain: Pain Assessment Pain Assessment: FLACC Pain Score: 1  Pain Type: Acute pain Pain Location: Shoulder Pain Orientation: Left Pain Descriptors / Indicators: Discomfort Pain Intervention(s): Repositioned;Other (Comment) (Pt reports not needing medication for pain) Locomotion : Ambulation Ambulation/Gait Assistance: 1: +2 Total assist (3 musketeers) Wheelchair Mobility Distance: 95  NMR:   Seated EOM, pt performed RUE anterolateral reaching with partial stand x12 reps, with full stand 2x5 reps. Activity focused on facilitating L hip/knee extension, increasing pt initiation of anterior weight shift. PT positioned under LUE  providing manual stabilization of L knee, manual facilitation of anterior weight shift, tactile cueing at R ribcage to promote ribcage expansion during reaching. During final 5 reps, pt performing ~80% of sit>stand transfer. See FIM for current functional status  Therapy/Group: Co-Treatment  Vaibhav Fogleman A 01/21/2014, 12:34 PM

## 2014-01-22 ENCOUNTER — Inpatient Hospital Stay (HOSPITAL_COMMUNITY): Payer: Medicaid Other

## 2014-01-22 ENCOUNTER — Inpatient Hospital Stay (HOSPITAL_COMMUNITY): Payer: Medicaid Other | Admitting: Speech Pathology

## 2014-01-22 LAB — GLUCOSE, CAPILLARY
GLUCOSE-CAPILLARY: 211 mg/dL — AB (ref 70–99)
Glucose-Capillary: 241 mg/dL — ABNORMAL HIGH (ref 70–99)
Glucose-Capillary: 227 mg/dL — ABNORMAL HIGH (ref 70–99)
Glucose-Capillary: 225 mg/dL — ABNORMAL HIGH (ref 70–99)
Glucose-Capillary: 229 mg/dL — ABNORMAL HIGH (ref 70–99)

## 2014-01-22 NOTE — Progress Notes (Signed)
Nursing Note: No void in 8 hours. Pt attempted to void and unable to.R: Pt in and out cathed by Courtney,NT for 300 cc.Pt tolerated well.wbb

## 2014-01-22 NOTE — Progress Notes (Addendum)
Subjective/Complaints: 48 y.o. right-handed female with history of uncontrolled hypertension, diabetes mellitus peripheral neuropathy, morbid obesity, nonischemic cardiomyopathy, chronic diastolic heart failure, CAD status post pacemaker placement and chronic back pain followed at outpatient rehabilitation services. Admitted 01/09/2014 with left facial weakness and slurred speech. Patient with previous admission 10/07/2013 with similar symptoms workup negative as per neurology services. Cranial CT scan consistent with possible right middle cerebral artery territory infarct. MRI not completed secondary to pacemaker. Patient did not receive TPA. CTA view of the head negative for hemorrhage did show occluded branch MCA vessel. CTA of the neck was negative.. Echocardiogram with ejection fraction of 40% no wall motion abnormalities.. TEE showed moderately depressed LVEF without thrombus or vegetation. . Currently maintained on aspirin for CVA prophylaxis. Subcutaneous heparin for DVT prophylaxis. Diet has been advanced to a regular consistency  Spasms improved last noc, slept better Voids small amts but still requiring cath  Review of Systems - Negative except back pain Objective: Vital Signs: Blood pressure 116/81, pulse 84, temperature 97.9 F (36.6 C), temperature source Oral, resp. rate 18, height $RemoveBe'5\' 9"'[id number]$  (1.753 m), weight 124.5 kg (274 lb 7.6 oz), last menstrual period 12/21/2013, SpO2 97.00%. No results found. Results for orders placed during the hospital encounter of 01/15/14 (from the past 72 hour(s))  GLUCOSE, CAPILLARY     Status: Abnormal   Collection Time    01/19/14  7:24 AM      Result Value Range   Glucose-Capillary 197 (*) 70 - 99 mg/dL  GLUCOSE, CAPILLARY     Status: Abnormal   Collection Time    01/19/14 11:23 AM      Result Value Range   Glucose-Capillary 237 (*) 70 - 99 mg/dL   Comment 1 Notify RN    GLUCOSE, CAPILLARY     Status: Abnormal   Collection Time    01/19/14  4:16 PM       Result Value Range   Glucose-Capillary 198 (*) 70 - 99 mg/dL   Comment 1 Notify RN    GLUCOSE, CAPILLARY     Status: Abnormal   Collection Time    01/19/14  9:29 PM      Result Value Range   Glucose-Capillary 233 (*) 70 - 99 mg/dL  GLUCOSE, CAPILLARY     Status: Abnormal   Collection Time    01/20/14  7:29 AM      Result Value Range   Glucose-Capillary 237 (*) 70 - 99 mg/dL  GLUCOSE, CAPILLARY     Status: Abnormal   Collection Time    01/20/14 12:08 PM      Result Value Range   Glucose-Capillary 278 (*) 70 - 99 mg/dL  GLUCOSE, CAPILLARY     Status: Abnormal   Collection Time    01/20/14  4:46 PM      Result Value Range   Glucose-Capillary 206 (*) 70 - 99 mg/dL  GLUCOSE, CAPILLARY     Status: Abnormal   Collection Time    01/20/14  9:28 PM      Result Value Range   Glucose-Capillary 205 (*) 70 - 99 mg/dL   Comment 1 Notify RN    GLUCOSE, CAPILLARY     Status: Abnormal   Collection Time    01/21/14  7:05 AM      Result Value Range   Glucose-Capillary 198 (*) 70 - 99 mg/dL   Comment 1 Notify RN    BASIC METABOLIC PANEL     Status: Abnormal   Collection Time  01/21/14  7:40 AM      Result Value Range   Sodium 138  137 - 147 mEq/L   Potassium 3.7  3.7 - 5.3 mEq/L   Chloride 97  96 - 112 mEq/L   CO2 28  19 - 32 mEq/L   Glucose, Bld 194 (*) 70 - 99 mg/dL   BUN 11  6 - 23 mg/dL   Creatinine, Ser 0.71  0.50 - 1.10 mg/dL   Calcium 9.4  8.4 - 10.5 mg/dL   GFR calc non Af Amer >90  >90 mL/min   GFR calc Af Amer >90  >90 mL/min   Comment: (NOTE)     The eGFR has been calculated using the CKD EPI equation.     This calculation has not been validated in all clinical situations.     eGFR's persistently <90 mL/min signify possible Chronic Kidney     Disease.  GLUCOSE, CAPILLARY     Status: Abnormal   Collection Time    01/21/14 11:16 AM      Result Value Range   Glucose-Capillary 248 (*) 70 - 99 mg/dL   Comment 1 Notify RN    GLUCOSE, CAPILLARY     Status: Abnormal    Collection Time    01/21/14  4:36 PM      Result Value Range   Glucose-Capillary 214 (*) 70 - 99 mg/dL   Comment 1 STAT Lab     Comment 2 Notify RN    GLUCOSE, CAPILLARY     Status: Abnormal   Collection Time    01/21/14  8:28 PM      Result Value Range   Glucose-Capillary 185 (*) 70 - 99 mg/dL   Comment 1 Notify RN       HEENT: white coating on tongue Cardio: RRR and no murmur Resp: CTA B/L and unlabored GI: BS positive and non distended Extremity:  Pulses positive and No Edema Skin:   Intact Neuro: Alert/Oriented, Abnormal Sensory absent to LT LUE and intact LLE, Abnormal Motor 0/5 in LUE and LLE and Other no evidence of L field cut Musc/Skel:  Other back pain  Gen NAD   Assessment/Plan: 1. Functional deficits secondary to Cardioembolic R MCA infarct which require 3+ hours per day of interdisciplinary therapy in a comprehensive inpatient rehab setting. Physiatrist is providing close team supervision and 24 hour management of active medical problems listed below. Physiatrist and rehab team continue to assess barriers to discharge/monitor patient progress toward functional and medical goals. FIM: FIM - Bathing Bathing Steps Patient Completed: Front perineal area (washed peri area only) Bathing: 3: Mod-Patient completes 5-7 30f 10 parts or 50-74%  FIM - Upper Body Dressing/Undressing Upper body dressing/undressing steps patient completed: Thread/unthread right sleeve of pullover shirt/dresss;Put head through opening of pull over shirt/dress Upper body dressing/undressing: 3: Mod-Patient completed 50-74% of tasks FIM - Lower Body Dressing/Undressing Lower body dressing/undressing steps patient completed: Thread/unthread right underwear leg;Thread/unthread right pants leg Lower body dressing/undressing: 1: Two helpers  FIM - Toileting Toileting: 1: Total-Patient completed zero steps, helper did all 3  FIM - Radio producer Devices: Grab  bars Toilet Transfers: 1-Two helpers  FIM - Control and instrumentation engineer Devices: Arm rests Bed/Chair Transfer: 3: Supine > Sit: Mod A (lifting assist/Pt. 50-74%/lift 2 legs;2: Bed > Chair or W/C: Max A (lift and lower assist)  FIM - Locomotion: Wheelchair Distance: 95 Locomotion: Wheelchair: 2: Travels 50 - 149 ft with supervision, cueing or coaxing FIM - Locomotion:  Ambulation Locomotion: Ambulation Assistive Devices: Other (comment) (R hallway hand rail) Ambulation/Gait Assistance: 1: +2 Total assist (3 musketeers) Locomotion: Ambulation: 1: Two helpers  Comprehension Comprehension Mode: Auditory Comprehension: 5-Follows basic conversation/direction: With extra time/assistive device  Expression Expression Mode: Verbal Expression: 5-Expresses basic needs/ideas: With no assist  Social Interaction Social Interaction: 5-Interacts appropriately 90% of the time - Needs monitoring or encouragement for participation or interaction.  Problem Solving Problem Solving: 3-Solves basic 50 - 74% of the time/requires cueing 25 - 49% of the time  Memory Memory: 5-Recognizes or recalls 90% of the time/requires cueing < 10% of the time  Medical Problem List and Plan:  1. Cardioembolic right MCA infarct, left hemiparesis and LUE sensory def  2. DVT Prophylaxis/Anticoagulation: Subcutaneous heparin. Monitor platelet counts and any signs of bleeding  3. Pain Management/chronic pain syndrome: Tegretol 200 mg twice a day for neuropathic pain. Patient also on Flexeril prior to admission but held due to some lethargy.  4. Neuropsych: This patient is capable of making decisions on her own behalf.  5. Nonischemic cardiomyopathy, CAD with pacemaker, placed Sept 2014. No chest pain or shortness of breath  6. Diabetes mellitus with peripheral neuropathy. Hemoglobin A1c 10.7.Increase Lantus insulin 16 units each bedtime.titrate up Check blood sugars a.c. and at bedtime  7. Hypertension.  Toprol-XL 25 mg twice a day, Lanoxin 0.125 mg daily. Monitor with increased activity. Recent blood pressure meds held due to some hypotension we'll resume as tolerated  8. Chronic diastolic heart failure. Monitor for any signs of fluid overload. Daily i's and o's and weights. Patient on Lasix 40 mg twice a day prior to admission. Consider resuming back as needed monitor fluid intake 9. Hyperlipidemia. Lipitor 10.  Spasticity start zanaflex at noc 11.  Urinary retention- started urecholine, voids small amts, add flomax , monitor BP  LOS (Days) 7 A FACE TO FACE EVALUATION WAS PERFORMED  [name] 01/22/2014, 6:15 AM

## 2014-01-22 NOTE — Plan of Care (Signed)
Problem: RH BLADDER ELIMINATION Goal: RH STG MANAGE BLADDER WITH MEDICATION WITH ASSISTANCE STG Manage Bladder With Medication With Mod Assistance.  Outcome: Not Progressing Urecholine 25mg  TID, requires I/O Cath

## 2014-01-22 NOTE — Progress Notes (Signed)
Physical Therapy Session Note  Patient Details  Name: [name] MRN: [id number] Date of Birth: [birth year]  Today's Date: 01/22/2014 Time: 1300-1330 (Co-tx with KNP (entire session from 1300-1400)) Time Calculation (min): 30 min  Short Term Goals: Week 1:  PT Short Term Goal 1 (Week 1): Pt will perform sit>supine with max A using bed rail with HOB flat. PT Short Term Goal 2 (Week 1): Pt will perform supine> sit with mod A using bed rail with HOB flat. PT Short Term Goal 3 (Week 1): Pt will perform bed<>chair transfer with total A of single therapist. PT Short Term Goal 4 (Week 1): Pt will perform w/c mobility x50' in controlled environment with mod A. PT Short Term Goal 5 (Week 1): Pt will perform static sitting x60 seconds without UE support requiring supervision.  Skilled Therapeutic Interventions/Progress Updates:    Skilled co-tx with primary OT (KNP). Pt received seated in w/c; agreeable to therapy. Session focused on increasing stability/independence with postural/gait stability, functional transfers. Pt performed self-propulsion of w/c using R hemi-technique x130' in controlled environment with supervision. Stand pivot transfer from w/c>mat table with +2A (second therapist providing min guard for safety), manual stabilization of L knee.  Seated EOM, pt performed the following dynamic sitting balance activities: RUE reaching across midline with OT manually facilitating LUE weightbearing. See OT note for further detail. Attempted to perform partial stands from mat table with concurrent RUE anterolateral reaching; however, pt with increased fatigue, frustration during attempt. Transitioned to sit<>stand x2 from elevated mat table mod-mad A with PT providing manual stabilization of L knee, manual facilitation of anterior weight shift. Static standing performed 2x15-20 seconds with one therapist providing mod A, manual stabilization of L knee; other therapist positioned posterior to pt,  providing manual stabilization of trunk, facilitation of upright posture.   Pt performed gait ~18' in controlled environment with +2A (3 musketeers assist) and w/c follow. Therapists positioned under bilat UE's providing manual facilitation of the following: anterior weight shift, L weight shift prior to RLE advancement, LLE advancement, manual stabilization of L knee during LLE stance. Pt with improved initiation of R weight shift prior to LLE advancement. Verbal cueing provided to promote RLE step length. Retrieved w/c with lower seat height to facilitate pt independence with functional transfers. Transported pt to room in w/c secondary to pt fatigue. Stand pivot transfer from w/c>bed with +1Total A. Sit>supine with max A for bilat LE management. Therapists departed with pt supine in bed with 2 bed rails up, bed alarm on, RN present, and all needs within reach.    Therapy Documentation Precautions:  Precautions Precautions: Fall;ICD/Pacemaker Precaution Comments: pacemaker; chronic back pain Restrictions Weight Bearing Restrictions: No Pain: Pain Assessment Pain Assessment: No/denies pain Pain Score: 0-No pain Locomotion : Ambulation Ambulation/Gait Assistance: 1: +2 Total assist (3 musketeers) Wheelchair Mobility Distance: 130   See FIM for current functional status  Therapy/Group: Individual Therapy  Hobble, Lorenda Ishihara 01/22/2014, 3:49 PM

## 2014-01-22 NOTE — Plan of Care (Signed)
Problem: RH BLADDER ELIMINATION Goal: RH STG MANAGE BLADDER WITH ASSISTANCE STG Manage Bladder With mod Assistance  Outcome: Not Progressing On urecholine and requires in and out caths prn,some incont, voids

## 2014-01-22 NOTE — Progress Notes (Addendum)
Speech Language Pathology Daily Session Note  Patient Details  Name: [name] MRN: [id number] Date of Birth: 1966-11-06  Today's Date: 01/22/2014 Time: 1200-1240 Time Calculation (min): 40 min  Short Term Goals: Week 1: SLP Short Term Goal 1 (Week 1): Pt will consume current diet with minimal overt s/s of aspiration with supervision verbal cues.  SLP Short Term Goal 2 (Week 1): Pt will demonstrate efficient mastication with trials of Dys. 3 textures with supervision verbal cues.  SLP Short Term Goal 3 (Week 1): Pt will demonstrate functional problem solving for complex tasks with supervision verbal cues.  SLP Short Term Goal 4 (Week 1): Pt will utilize speech intelligibility strategies at the sentence level with Mod I.  SLP Short Term Goal 5 (Week 1): Pt will perfrom lingual and labial oral-motor exercises with Min A verbal and visual cues.  SLP Short Term Goal 6 (Week 1): Pt will attend to left field of enviornment/body during functional tasks with mod I.   Skilled Therapeutic Interventions: Co-treatment with OT in [employer] with treatment focus on dysphagia and cognitive goals and self-feeding. Pt consumed Dys. 3 textures with thin liquids via straw without overt s/s of aspiration and was Mod I for utilization of swallowing compensatory strategies. Pt continues to demonstrate decreased PO intake with meals. Pt appeared brighter today and initiated conversation with other group members with appropriate social interaction. Continue with current plan of care.    FIM:  Comprehension Comprehension Mode: Auditory Comprehension: 6-Follows complex conversation/direction: With extra time/assistive device Expression Expression Mode: Verbal Expression: 5-Expresses basic needs/ideas: With extra time/assistive device Social Interaction Social Interaction: 5-Interacts appropriately 90% of the time - Needs monitoring or encouragement for participation or interaction. Problem Solving Problem  Solving: 5-Solves basic problems: With no assist Memory Memory: 6-More than reasonable amt of time FIM - Eating Eating Activity: 6: Swallowing techniques: self-managed;5: Set-up assist for open containers  Pain Pain Assessment Pain Assessment: No/denies pain   Therapy/Group: Group Therapy  Deniqua Perry 01/22/2014, 2:13 PM

## 2014-01-22 NOTE — Progress Notes (Signed)
Speech Language Pathology Daily Session Note  Patient Details  Name: [name] MRN: [id number] Date of Birth: 09/09/1966  Today's Date: 01/22/2014 Time: 1000-1045 Time Calculation (min): 45 min  Short Term Goals: Week 1: SLP Short Term Goal 1 (Week 1): Pt will consume current diet with minimal overt s/s of aspiration with supervision verbal cues.  SLP Short Term Goal 2 (Week 1): Pt will demonstrate efficient mastication with trials of Dys. 3 textures with supervision verbal cues.  SLP Short Term Goal 3 (Week 1): Pt will demonstrate functional problem solving for complex tasks with supervision verbal cues.  SLP Short Term Goal 4 (Week 1): Pt will utilize speech intelligibility strategies at the sentence level with Mod I.  SLP Short Term Goal 5 (Week 1): Pt will perfrom lingual and labial oral-motor exercises with Min A verbal and visual cues.  SLP Short Term Goal 6 (Week 1): Pt will attend to left field of enviornment/body during functional tasks with mod I.   Skilled Therapeutic Interventions: Skilled treatment focused on speech and swallowing goals. SLP facilitated session with skilled observation of thin liquid trials via straw sips, which pt consumed with Mod I for use of strategies and no overt s/s of aspiration observed. Patient does not appear to need full supervision and was adjusted to intermittent; discussed with pt who is in agreement. Pt required Mod-Max cues for completion of oral motor exercises with minimal labial movement noted on the left side. Speech was intelligible at the conversational level. Pt was able to recall therapy events from the weekend and the previous day with Mod I. Continue plan of care.   FIM:  Comprehension Comprehension Mode: Auditory Comprehension: 7-Follows complex conversation/direction: With no assist Expression Expression Mode: Verbal Expression: 5-Expresses basic needs/ideas: With extra time/assistive device Social Interaction Social  Interaction: 5-Interacts appropriately 90% of the time - Needs monitoring or encouragement for participation or interaction. Problem Solving Problem Solving: 5-Solves basic problems: With no assist Memory Memory: 6-More than reasonable amt of time FIM - Eating Eating Activity: 6: Swallowing techniques: self-managed;5: Set-up assist for open containers  Pain Pain Assessment Pain Assessment: 0-10 Pain Score: 8  Pain Location: Head Pain Orientation: Posterior Pain Intervention(s): Medication (See eMAR) (tylenol given)  Therapy/Group: Individual Therapy   Maxcine Ham, M.A. CCC-SLP [phone]   Maxcine Ham 01/22/2014, 12:25 PM

## 2014-01-22 NOTE — Plan of Care (Signed)
Problem: RH BLADDER ELIMINATION Goal: RH STG MANAGE BLADDER WITH ASSISTANCE STG Manage Bladder With mod Assistance  Outcome: Not Progressing Requires In/Out Cath Q6H requires total assist  Problem: RH SKIN INTEGRITY Goal: RH STG SKIN FREE OF INFECTION/BREAKDOWN Skin free of infection/breakdown with min assist  Redness under abdominal skin folds, managed by cleansing with soap/water and Microguard powder

## 2014-01-22 NOTE — Progress Notes (Signed)
Pt. Refused cpap for tonight. Pt. States she will notify if she changes her mind.

## 2014-01-22 NOTE — Progress Notes (Signed)
Occupational Therapy Session Note  Patient Details  Name: [name] MRN: [id number] Date of Birth: May 27, 1966  Today's Date: 01/22/2014 Time: 1330 (co-tx with PT 1300-1400)-1400 Time Calculation (min): 30 min  Short Term Goals: Week 1:  OT Short Term Goal 1 (Week 1): Patient will complete sit<>stand during self-care task with max assist  OT Short Term Goal 2 (Week 1): Patient will complete squat pivot transfer total assist +1 OT Short Term Goal 3 (Week 1): Pt will complete UB dressing with mod assist OT Short Term Goal 4 (Week 1): Pt will complete toilet task with total assist +1  Skilled Therapeutic Interventions/Progress Updates:    Pt seen for skilled co-tx with primary PT focusing on postural control in standing, L NMR, and functional transfers. Pt reported being very fatigued and blood sugar high at lunch however willing to participate in therapy. Propelled self to therapy gym with increased time and cues of encouragement. Min initiation for squat pivot and stand pivot transfer w/c>mat table requiring +2 assist (min guard from second therapist). Engaged in NMR activities to LUE via reaching while weight bearing through LUE in extension and slight external rotation. Pt tolerating without pain. Immediately following activity, slight tricep activation noted with gravity assisted. Engaged in reaching activity to facilitate anterior and lateral weight shifting to R side in prep for sit<>stand and functional transfers. Attempted to complete partial stands however unsuccessful as pt too fatigued. Completed sit<>stand x2 with focus on glute activation (some L activation noted) and anterior weight shift with hip extension. Completed stand pivot transfer back to w/c with max assist +1. Performed gait approx 15-20 feet with +2 assist using 3 musketeers technique. Therapists positioned under bil UE's for manual facilitation for anterior weight shift and LLE advancement. Pt required cues for timing for  advancement of RLE as she tends to step too quickly. Pt fatigued at end of session and difficulty remaining aroused. Completed stand pivot transfer total assist +1 w/c>bed and max assist for sit>supine. RN present to scan bladder and in/out cath in hopes to assist with making pt feel better.   Therapy Documentation Precautions:  Precautions Precautions: Fall;ICD/Pacemaker Precaution Comments: pacemaker; chronic back pain Restrictions Weight Bearing Restrictions: No General:   Vital Signs:   Pain: Pt with no reports of pain however very fatigue. RN notified.   See FIM for current functional status  Therapy/Group: Co-Treatment  Braelynne Garinger, Vara Guardian 01/22/2014, 2:47 PM

## 2014-01-22 NOTE — Progress Notes (Signed)
Occupational Therapy Session Note  Patient Details  Name: [name] MRN: [id number] Date of Birth: 02-24-1966  Today's Date: 01/22/2014 Time: 0731-0830 Time Calculation (min): 59 min  Short Term Goals: Week 1:  OT Short Term Goal 1 (Week 1): Patient will complete sit<>stand during self-care task with max assist  OT Short Term Goal 2 (Week 1): Patient will complete squat pivot transfer total assist +1 OT Short Term Goal 3 (Week 1): Pt will complete UB dressing with mod assist OT Short Term Goal 4 (Week 1): Pt will complete toilet task with total assist +1  Skilled Therapeutic Interventions/Progress Updates:    Pt seen for ADL retraining with focus on functional transfers, postural control in standing, and use of LUE. Pt received supine in bed requesting to complete toilet task. Completed stand pivot transfers with max assist going to both L and R side and assist for advancement of LUE. Completed bathing at sink. Provided passive stretching to LUE while pt using RUE to wash chest and abdomen with increased tone noted in LUE. Provided HOH assist for washing RUE with LUE and practiced using alternative technique of placing wash cloth on sink and rubbing RUE onto it. Pt require max assist for all sit<>stands initially and manual facilitation for activation of L glutes in standing. Pt fatigued for last stand requiring multiple attempts and total assist. Pt unable to don R sock while sitting in w/c however able to do yesterday while sitting EOB. At end of session pt left sitting in w/c with lap tray and all needs in reach.   Therapy Documentation Precautions:  Precautions Precautions: Fall;ICD/Pacemaker Precaution Comments: pacemaker; chronic back pain Restrictions Weight Bearing Restrictions: No General:   Vital Signs: Therapy Vitals Pulse Rate: 102 BP: 129/93 mmHg Pain: Pt reporting some back pain however not providing numerical rating.   See FIM for current functional  status  Therapy/Group: Individual Therapy  Daneil Dan 01/22/2014, 12:06 PM

## 2014-01-22 NOTE — Progress Notes (Signed)
Occupational Therapy Note  Patient Details  Name: [name] MRN: [id number] Date of Birth: September 11, 1966 Today's Date: 01/22/2014 Time: 1130-1200 (30 mins)  Pt seen for group session, [employer], with SLP with focus on self-feeding and attention to Lt visual field. Pt setup with lunch tray with scanning to Lt to obtain items with min verbal cues.  Pt requested for assist with cutting sandwich into smaller pieces. Pt demonstrated increased awareness with reports of wanting to engage in rehab program to increase safety and promote further independence prior to d/c home.  Pt continues to demonstrate decreased PO intake with meals. Pt appeared brighter today and initiated conversation with other group members with appropriate social interaction.  Pt with no c/o pain   [name], [facility] 01/22/2014, 3:25 PM

## 2014-01-23 ENCOUNTER — Inpatient Hospital Stay (HOSPITAL_COMMUNITY): Payer: Medicaid Other | Admitting: Speech Pathology

## 2014-01-23 ENCOUNTER — Inpatient Hospital Stay (HOSPITAL_COMMUNITY): Payer: Medicaid Other

## 2014-01-23 ENCOUNTER — Inpatient Hospital Stay (HOSPITAL_COMMUNITY): Payer: Medicaid Other | Admitting: Physical Therapy

## 2014-01-23 LAB — GLUCOSE, CAPILLARY
GLUCOSE-CAPILLARY: 205 mg/dL — AB (ref 70–99)
GLUCOSE-CAPILLARY: 241 mg/dL — AB (ref 70–99)
Glucose-Capillary: 286 mg/dL — ABNORMAL HIGH (ref 70–99)
Glucose-Capillary: 179 mg/dL — ABNORMAL HIGH (ref 70–99)

## 2014-01-23 MED ORDER — TAMSULOSIN HCL 0.4 MG PO CAPS
0.4000 mg | ORAL_CAPSULE | Freq: Every day | ORAL | Status: DC
  Administered 2014-01-23 – 2014-02-04 (×13): 0.4 mg via ORAL
  Filled 2014-01-23 (×14): qty 1

## 2014-01-23 MED ORDER — TAB-A-VITE/IRON PO TABS
1.0000 | ORAL_TABLET | Freq: Every day | ORAL | Status: DC
  Administered 2014-01-23 – 2014-02-05 (×14): 1 via ORAL
  Filled 2014-01-23 (×15): qty 1

## 2014-01-23 NOTE — Progress Notes (Signed)
Speech Language Pathology Daily Session Note  Patient Details  Name: [name] MRN: [id number] Date of Birth: 02-26-1966  Today's Date: 01/23/2014 Time: 1130-1145 Time Calculation (min): 15 min  Short Term Goals: Week 2: SLP Short Term Goal 1 (Week 2): Pt will demonstrate efficient mastication and oral clearance with trials of regular textures with Mod I SLP Short Term Goal 2 (Week 2): Pt will demonstrate functional problem solving for complex tasks with supervision verbal cues.  SLP Short Term Goal 3 (Week 2): Pt will utilize speech intelligibility strategies at the sentence level with Mod I.  SLP Short Term Goal 4 (Week 2): Pt will perfrom lingual and labial oral-motor exercises with Min A verbal and visual cues.  SLP Short Term Goal 5 (Week 2): Pt will attend to left field of enviornment/body during functional tasks with mod I.   Skilled Therapeutic Interventions: Co-treatment with OT in diners club with treatment focus on dysphagia and cognitive goals and self-feeding. Pt consumed upgraded lunch meal of regular textures with thin liquids via straw. Pt demonstrated intermittent coughing throughout the meal, suspect due to decreased mastication of regular textures. Pt also required verbal cues to self-monitor and correct left buccal pocketing throughout the meal with upgraded textures.  Pt continues to demonstrate decreased PO intake with meals. Pt initiated conversation with other group members with appropriate social interaction and humor. Continue with current plan of care.   FIM:  Comprehension Comprehension Mode: Auditory Comprehension: 6-Follows complex conversation/direction: With extra time/assistive device Expression Expression Mode: Verbal Expression: 5-Expresses complex 90% of the time/cues < 10% of the time Social Interaction Social Interaction: 5-Interacts appropriately 90% of the time - Needs monitoring or encouragement for participation or interaction. Problem  Solving Problem Solving: 5-Solves basic problems: With no assist Memory Memory: 6-More than reasonable amt of time FIM - Eating Eating Activity: 5: Supervision/cues;5: Set-up assist for open containers  Pain Pain Assessment Pain Assessment: No/denies pain  Therapy/Group: Group Therapy  Yvonne Petite 01/23/2014, 1:15 PM

## 2014-01-23 NOTE — Patient Care Conference (Signed)
Inpatient RehabilitationTeam Conference and Plan of Care Update Date: 01/23/2014   Time: 11;30 AM    Patient Name: [name]      Medical Record Number: [id number]  Date of Birth: [birth year] Sex: Female         Room/Bed: 4W09C/4W09C-01 Payor Info: Payor: MEDICAID [place] / Plan: MEDICAID [id number] ACCESS / Product Type: *No Product type* /    Admitting Diagnosis: R MCA infarct  Admit Date/Time:  01/15/2014  6:11 PM Admission Comments: No comment available   Primary Diagnosis:  <principal problem not specified> Principal Problem: <principal problem not specified>  Patient Active Problem List   Diagnosis Date Noted  . CVA (cerebral infarction) 01/12/2014  . Aphasia 01/09/2014  . Perimenopausal menorrhagia 12/20/2013  . Candida vaginitis 12/20/2013  . Pyuria 12/20/2013  . Biventricular implantable cardiac defibrillator -[employer] 11/23/2013  . Mixed stress and urge urinary incontinence 10/11/2013  . Headache(784.0) 10/06/2013  . Precordial pain 09/25/2013  . Preventative health care 09/13/2013  . Chronic combined systolic and diastolic heart failure 08/01/2013  . Nonischemic cardiomyopathy 08/01/2013  . Goiter 08/01/2013  . LBBB (left bundle branch block) 07/19/2013  . Prolonged Q-T interval on ECG 06/06/2013  . Depression 04/09/2013  . Myofascial pain 04/02/2013  . Osteoarthrosis, localized, primary, knee, bilateral 04/14/2012  . Lumbar spondylosis 04/14/2012  . Diabetic peripheral neuropathy 04/14/2012  . Obesity 11/23/2011  . Hyperlipidemia 11/23/2011  . Type 2 diabetes mellitus, uncontrolled, with neuropathy 09/03/2011  . OSA (obstructive sleep apnea) 08/27/2011  . ESSENTIAL HYPERTENSION 08/27/2010  . CHEST PAIN, HX OF 08/27/2010    Expected Discharge Date: Expected Discharge Date: 02/05/14  Team Members Present: Physician leading conference: Dr. Claudette Laws Social Worker Present: Staci Acosta, LCSW;Becky Sherran Margolis, LCSW Nurse Present: Other (comment) Ashok Cordia  Turner-RN) PT Present: Edman Circle, PT;Emily Marya Amsler, PT OT Present: Rosalio Loud, OT;Kris Elise Benne, OT SLP Present: Fae Pippin, SLP PPS Coordinator present : Tora Duck, RN, CRRN     Current Status/Progress Goal Weekly Team Focus  Medical   urinary retention, depressed after sister's death  regular diet, normal bladder fxn  adjust meds   Bowel/Bladder   retention,requires in and out caths and is on urecholine  pt able to void and empty bladder      Swallow/Nutrition/ Hydration   Dys 3 textures and thin liquids, Mod I for strategies  Mod I with least restrictive PO  advanced trials, current diet tolerance   ADL's   max-total LB dressing, mod assist UB dressing, mod assist bathing, max assist +1 transfers, total assist toilet task  mod assist toilet task and transfers, min assist bathing and dressing   postural control in standing, functional transfers, L NMR, bed mobility, strengtheing, standing balance   Mobility   Max A for bed mobility, Total A for transfers, supervision with w/c mobility, +2A with gait x28'  Min A overall, with exception of gait (Mod A)  Functional transfers, postural/gait stability, functional balance, L NMR, activity tolerance   Communication   mild dysarthria, Mod-Max for oral motor exercises  Mod I for intelligibility at the sentence level  oral motor exercises to increase labial/lingual strength, coordination, and ROM   Safety/Cognition/ Behavioral Observations  Min cues for complex problem solving  Mod I  complex problem solving, aniticpatory awareness   Pain   No c/o pain  managed pain of back by d/c      Skin   Skin folds moist,kept dry w/ micro-guard powder  Ski n remains intact w/o breakdown         *  See Care Plan and progress notes for long and short-term goals.  Barriers to Discharge: current heavy assist, poor family support    Possible Resolutions to Barriers:  cont rehab, cont current meds    Discharge Planning/Teaching  Needs:  Home with family providing care-have been here to observe but will need to begin eariler due to amount to teach and care pt will require      Team Discussion:  I & O cath-retension issues-working on with meds.  Back pain issues better.  BS issues.  Difficult day yesterday due to cousin's funeral-much loss in a short amount of time-cousin & sister.  Need family education sooner due to amount of care pt will require  Revisions to Treatment Plan:  None   Continued Need for Acute Rehabilitation Level of Care: The patient requires daily medical management by a physician with specialized training in physical medicine and rehabilitation for the following conditions: Daily direction of a multidisciplinary physical rehabilitation program to ensure safe treatment while eliciting the highest outcome that is of practical value to the patient.: Yes Daily medical management of patient stability for increased activity during participation in an intensive rehabilitation regime.: Yes Daily analysis of laboratory values and/or radiology reports with any subsequent need for medication adjustment of medical intervention for : Neurological problems;Other  Mitzi Lilja, Lemar Livings 01/23/2014, 1:55 PM

## 2014-01-23 NOTE — Progress Notes (Signed)
Nursing Note: No void in 8 hours.A: Pt in and out cathed for 200 cc of amber urine w/ malodor.Pt tolerated well.wbb

## 2014-01-23 NOTE — Progress Notes (Signed)
Speech Language Pathology Weekly Progress and Session Notes  Patient Details  Name: [name] MRN: [id number] Date of Birth: [birth year]  Today's Date: 01/23/2014 Time: 7371-0626 Time Calculation (min): 45 min  Short Term Goals: Week 1: SLP Short Term Goal 1 (Week 1): Pt will consume current diet with minimal overt s/s of aspiration with supervision verbal cues.  SLP Short Term Goal 1 - Progress (Week 1): Met SLP Short Term Goal 2 (Week 1): Pt will demonstrate efficient mastication with trials of Dys. 3 textures with supervision verbal cues.  SLP Short Term Goal 2 - Progress (Week 1): Met SLP Short Term Goal 3 (Week 1): Pt will demonstrate functional problem solving for complex tasks with supervision verbal cues.  SLP Short Term Goal 3 - Progress (Week 1): Not met SLP Short Term Goal 4 (Week 1): Pt will utilize speech intelligibility strategies at the sentence level with Mod I.  SLP Short Term Goal 4 - Progress (Week 1): Not met SLP Short Term Goal 5 (Week 1): Pt will perfrom lingual and labial oral-motor exercises with Min A verbal and visual cues.  SLP Short Term Goal 5 - Progress (Week 1): Not met SLP Short Term Goal 6 (Week 1): Pt will attend to left field of enviornment/body during functional tasks with mod I.  SLP Short Term Goal 6 - Progress (Week 1): Not met  New Short Term Goals: Week 2: SLP Short Term Goal 1 (Week 2): Pt will demonstrate efficient mastication and oral clearance with trials of regular textures with Mod I SLP Short Term Goal 2 (Week 2): Pt will demonstrate functional problem solving for complex tasks with supervision verbal cues.  SLP Short Term Goal 3 (Week 2): Pt will utilize speech intelligibility strategies at the sentence level with Mod I.  SLP Short Term Goal 4 (Week 2): Pt will perfrom lingual and labial oral-motor exercises with Min A verbal and visual cues.  SLP Short Term Goal 5 (Week 2): Pt will attend to left field of enviornment/body during  functional tasks with mod I.     New Short Term Goals: Week 2: SLP Short Term Goal 1 (Week 2): Pt will demonstrate efficient mastication and oral clearance with trials of regular textures with Mod I SLP Short Term Goal 2 (Week 2): Pt will demonstrate functional problem solving for complex tasks with supervision verbal cues.  SLP Short Term Goal 3 (Week 2): Pt will utilize speech intelligibility strategies at the sentence level with Mod I.  SLP Short Term Goal 4 (Week 2): Pt will perfrom lingual and labial oral-motor exercises with Min A verbal and visual cues.  SLP Short Term Goal 5 (Week 2): Pt will attend to left field of enviornment/body during functional tasks with mod I.   Weekly Progress Updates: Pt has met 2 out of 6 STGs during this reporting period, demonstrating functional gains in oropharyngeal swallowing function. Pt is consuming Dys 3 textures and thin liquids with intermittent supervision, and is scheduled to begin trialing regular textures. Pt's speech is intelligible at the conversational level, however she will benefit from instruction and practice regarding speech intelligibility strategies. Pt requires Mod-Max cues for oral motor exercises and Min cues for complex problem solving and higher level cognitive tasks. Pt will benefit from continued SLP services to maximize functional communication, swallowing safety, and functional independence prior to discharge home with family.   Daily Session Skilled Therapeutic Interventions: Skilled treatment focused on cognitive goals. SLP facilitated session with structured, higher-level calendar task. Pt  demonstrated complex problem solving, organization, and reasoning skills with Min cues. Pt reported that she had difficulty with organization prior to CVA, and that this task did not appear more challenging. Pt navigated crowded halls in wheel chair and demonstrated route finding back to room with Mod I. She demonstrated anticipatory awareness  during functional conversation of current cognitive and swallowing function. Pt utilized menu to select regular texture meal to trial at lunch today; SLP placed order.        FIM:  Comprehension Comprehension Mode: Auditory Comprehension: 6-Follows complex conversation/direction: With extra time/assistive device Expression Expression Mode: Verbal Expression: 5-Expresses complex 90% of the time/cues < 10% of the time Social Interaction Social Interaction: 5-Interacts appropriately 90% of the time - Needs monitoring or encouragement for participation or interaction. Problem Solving Problem Solving: 5-Solves basic problems: With no assist Memory Memory: 6-More than reasonable amt of time General    Pain Pain Assessment Pain Assessment: No/denies pain Pain Score: 4  Pain Type: Acute pain Pain Location: Neck Pain Orientation: Posterior Pain Descriptors / Indicators: Aching Pain Intervention(s): Medication (See eMAR)  Therapy/Group: Individual Therapy   Germain Osgood, M.A. CCC-SLP [phone]   Germain Osgood 01/23/2014, 12:03 PM

## 2014-01-23 NOTE — Progress Notes (Signed)
Occupational Therapy Weekly Progress Note  Patient Details  Name: [name] MRN: [id number] Date of Birth: Dec 27, 1965  Today's Date: 01/23/2014 Time: 0730-0830 Time Calculation (min): 60 min  Patient has met 4 of 4 short term goals.  Patient is progressing well during her rehab stay. Patient has currently progressed functional transfers from +2 total assist to max assist +1. Patient occasionally requires total assist +1 with fatigue. Patient has been going through difficult times with the loss of 2 close family members in a one week span and was slightly limited by motivation. Patient is doing better now and is scheduled to see neuropsychologist. Patient demonstrates good recall of hemi dressing technique requiring min to no cueing. Patient has developed tone in LUE and has practiced self-ROM exercises in therapy. Patient has also demonstrated much less of L inattention as compared to first few days on rehab unit.   Patient continues to demonstrate the following deficits: L hemiplegia, decreased postural control in sitting and standing, decreased standing balance, decreased activity tolerance, decreased coordination, decreased ability to compensate for deficits, decreased strength, decreased awareness and therefore will continue to benefit from skilled OT intervention to enhance overall performance with BADL and functinal use of LUE, postural control in standing, activity tolerance, ability to compensate for deficits. .  Patient progressing toward long term goals..  Continue plan of care.  OT Short Term Goals Week 1:  OT Short Term Goal 1 (Week 1): Patient will complete sit<>stand during self-care task with max assist  OT Short Term Goal 1 - Progress (Week 1): Met OT Short Term Goal 2 (Week 1): Patient will complete squat pivot transfer total assist +1 OT Short Term Goal 2 - Progress (Week 1): Met OT Short Term Goal 3 (Week 1): Pt will complete UB dressing with mod assist OT Short Term  Goal 3 - Progress (Week 1): Met OT Short Term Goal 4 (Week 1): Pt will complete toilet task with total assist +1 OT Short Term Goal 4 - Progress (Week 1): Met Week 2:  OT Short Term Goal 1 (Week 2): Pt will complete tub transfer using TTB with max assist OT Short Term Goal 2 (Week 2): Pt will complete UB dressing with min assist  OT Short Term Goal 3 (Week 2): Pt will complete toilet task with max assist OT Short Term Goal 4 (Week 2): Pt will complete sit<>stand with mod assist for initiation and balance for 1 grooming task.   Skilled Therapeutic Interventions/Progress Updates:    Pt seen for ADL retraining with focus on safety during functional transfers, functional use of LUE, and postural control in sitting and standing. Pt received supine in bed and eager for shower this AM. Discussed safety with placement of w/c and towel under feet for stand pivot and sit<>stand transfers during shower. Pt required max assist for standing balance in shower limited to approx 10-12 seconds. HOH assist provided for washing RUE using LUE. Provided PROM to LUE prior to this to increase functional use. Completed dressing while in w/c with focus on anterior weight shift to thread RLE into LB clothing. Pt improving anterior weight shift and postural control during this task as she sustaining grip on pants rather then dropping to floor then placing foot into them. Pt assisted with clothing management around R hip while in standing requiring max assist for standing balance. At end of session pt left sitting in w/c with lap tray donned and all needs in reach.   Therapy Documentation Precautions:  Precautions  Precautions: Fall;ICD/Pacemaker Precaution Comments: pacemaker; chronic back pain Restrictions Weight Bearing Restrictions: No General:   Vital Signs:   Pain: Pt with no report of pain during therapy session.   See FIM for current functional status  Therapy/Group: Individual Therapy  Duayne Cal 01/23/2014, 10:31 AM

## 2014-01-23 NOTE — Progress Notes (Signed)
Subjective/Complaints: 48 y.o. right-handed female with history of uncontrolled hypertension, diabetes mellitus peripheral neuropathy, morbid obesity, nonischemic cardiomyopathy, chronic diastolic heart failure, CAD status post pacemaker placement and chronic back pain followed at outpatient rehabilitation services. Admitted 01/09/2014 with left facial weakness and slurred speech. Patient with previous admission 10/07/2013 with similar symptoms workup negative as per neurology services. Cranial CT scan consistent with possible right middle cerebral artery territory infarct. MRI not completed secondary to pacemaker. Patient did not receive TPA. CTA view of the head negative for hemorrhage did show occluded branch MCA vessel. CTA of the neck was negative.. Echocardiogram with ejection fraction of 40% no wall motion abnormalities.. TEE showed moderately depressed LVEF without thrombus or vegetation. . Currently maintained on aspirin for CVA prophylaxis. Subcutaneous heparin for DVT prophylaxis. Diet has been advanced to a regular consistency  Spasms improved last noc, slept better Voids small amts but still requiring cath  Review of Systems - Negative except back pain Objective: Vital Signs: Blood pressure 139/76, pulse 79, temperature 97.6 F (36.4 C), temperature source Oral, resp. rate 18, height 5' 9" (1.753 m), weight 124.5 kg (274 lb 7.6 oz), last menstrual period 12/21/2013, SpO2 100.00%. No results found. Results for orders placed during the hospital encounter of 01/15/14 (from the past 72 hour(s))  GLUCOSE, CAPILLARY     Status: Abnormal   Collection Time    01/20/14  7:29 AM      Result Value Ref Range   Glucose-Capillary 237 (*) 70 - 99 mg/dL  GLUCOSE, CAPILLARY     Status: Abnormal   Collection Time    01/20/14 12:08 PM      Result Value Ref Range   Glucose-Capillary 278 (*) 70 - 99 mg/dL  GLUCOSE, CAPILLARY     Status: Abnormal   Collection Time    01/20/14  4:46 PM      Result  Value Ref Range   Glucose-Capillary 206 (*) 70 - 99 mg/dL  GLUCOSE, CAPILLARY     Status: Abnormal   Collection Time    01/20/14  9:28 PM      Result Value Ref Range   Glucose-Capillary 205 (*) 70 - 99 mg/dL   Comment 1 Notify RN    GLUCOSE, CAPILLARY     Status: Abnormal   Collection Time    01/21/14  7:05 AM      Result Value Ref Range   Glucose-Capillary 198 (*) 70 - 99 mg/dL   Comment 1 Notify RN    BASIC METABOLIC PANEL     Status: Abnormal   Collection Time    01/21/14  7:40 AM      Result Value Ref Range   Sodium 138  137 - 147 mEq/L   Potassium 3.7  3.7 - 5.3 mEq/L   Chloride 97  96 - 112 mEq/L   CO2 28  19 - 32 mEq/L   Glucose, Bld 194 (*) 70 - 99 mg/dL   BUN 11  6 - 23 mg/dL   Creatinine, Ser 0.71  0.50 - 1.10 mg/dL   Calcium 9.4  8.4 - 10.5 mg/dL   GFR calc non Af Amer >90  >90 mL/min   GFR calc Af Amer >90  >90 mL/min   Comment: (NOTE)     The eGFR has been calculated using the CKD EPI equation.     This calculation has not been validated in all clinical situations.     eGFR's persistently <90 mL/min signify possible Chronic Kidney     Disease.  GLUCOSE, CAPILLARY     Status: Abnormal   Collection Time    01/21/14 11:16 AM      Result Value Ref Range   Glucose-Capillary 248 (*) 70 - 99 mg/dL   Comment 1 Notify RN    GLUCOSE, CAPILLARY     Status: Abnormal   Collection Time    01/21/14  4:36 PM      Result Value Ref Range   Glucose-Capillary 214 (*) 70 - 99 mg/dL   Comment 1 STAT Lab     Comment 2 Notify RN    GLUCOSE, CAPILLARY     Status: Abnormal   Collection Time    01/21/14  8:28 PM      Result Value Ref Range   Glucose-Capillary 185 (*) 70 - 99 mg/dL   Comment 1 Notify RN    GLUCOSE, CAPILLARY     Status: Abnormal   Collection Time    01/22/14  7:35 AM      Result Value Ref Range   Glucose-Capillary 211 (*) 70 - 99 mg/dL  GLUCOSE, CAPILLARY     Status: Abnormal   Collection Time    01/22/14 11:45 AM      Result Value Ref Range    Glucose-Capillary 241 (*) 70 - 99 mg/dL  GLUCOSE, CAPILLARY     Status: Abnormal   Collection Time    01/22/14  2:13 PM      Result Value Ref Range   Glucose-Capillary 227 (*) 70 - 99 mg/dL  GLUCOSE, CAPILLARY     Status: Abnormal   Collection Time    01/22/14  5:07 PM      Result Value Ref Range   Glucose-Capillary 225 (*) 70 - 99 mg/dL  GLUCOSE, CAPILLARY     Status: Abnormal   Collection Time    01/22/14  8:48 PM      Result Value Ref Range   Glucose-Capillary 229 (*) 70 - 99 mg/dL     HEENT: white coating on tongue Cardio: RRR and no murmur Resp: CTA B/L and unlabored GI: BS positive and non distended Extremity:  Pulses positive and No Edema Skin:   Intact Neuro: Alert/Oriented, Abnormal Sensory absent to LT LUE and intact LLE, Abnormal Motor 0/5 in LUE and LLE and Other no evidence of L field cut Musc/Skel:  Other back pain  Gen NAD   Assessment/Plan: 1. Functional deficits secondary to Cardioembolic R MCA infarct which require 3+ hours per day of interdisciplinary therapy in a comprehensive inpatient rehab setting. Physiatrist is providing close team supervision and 24 hour management of active medical problems listed below. Physiatrist and rehab team continue to assess barriers to discharge/monitor patient progress toward functional and medical goals. FIM: FIM - Bathing Bathing Steps Patient Completed: Chest;Left Arm;Abdomen;Right upper leg;Left upper leg Bathing: 3: Mod-Patient completes 5-7 38f10 parts or 50-74%  FIM - Upper Body Dressing/Undressing Upper body dressing/undressing steps patient completed: Thread/unthread right sleeve of pullover shirt/dresss;Put head through opening of pull over shirt/dress Upper body dressing/undressing: 3: Mod-Patient completed 50-74% of tasks FIM - Lower Body Dressing/Undressing Lower body dressing/undressing steps patient completed: Thread/unthread right underwear leg;Thread/unthread right pants leg Lower body  dressing/undressing: 2: Max-Patient completed 25-49% of tasks  FIM - TMusicianDevices: Grab bar or rail for support Toileting: 1: Total-Patient completed zero steps, helper did all 3  FIM - TRadio producerDevices: Grab bars Toilet Transfers: 2-To toilet/BSC: Max A (lift and lower assist);2-From toilet/BSC: Max A (lift and lower  assist)  FIM - Bed/Chair Transfer Bed/Chair Transfer Assistive Devices: Arm rests Bed/Chair Transfer: 2: Bed > Chair or W/C: Max A (lift and lower assist);2: Sit > Supine: Max A (lifting assist/Pt. 25-49%);1: Chair or W/C > Bed: Total A (helper does all/Pt. < 25%);1: Two helpers  FIM - Locomotion: Wheelchair Distance: 130 Locomotion: Wheelchair: 2: Travels 50 - 149 ft with supervision, cueing or coaxing FIM - Locomotion: Ambulation Locomotion: Ambulation Assistive Devices: Other (comment) (none) Ambulation/Gait Assistance: 1: +2 Total assist (3 musketeers) Locomotion: Ambulation: 1: Two helpers  Comprehension Comprehension Mode: Auditory Comprehension: 6-Follows complex conversation/direction: With extra time/assistive device  Expression Expression Mode: Verbal Expression: 5-Expresses basic needs/ideas: With extra time/assistive device  Social Interaction Social Interaction: 5-Interacts appropriately 90% of the time - Needs monitoring or encouragement for participation or interaction.  Problem Solving Problem Solving: 5-Solves basic problems: With no assist  Memory Memory: 6-More than reasonable amt of time  Medical Problem List and Plan:  1. Cardioembolic right MCA infarct, left hemiparesis and LUE sensory def  2. DVT Prophylaxis/Anticoagulation: Subcutaneous heparin. Monitor platelet counts and any signs of bleeding  3. Pain Management/chronic pain syndrome: Tegretol 200 mg twice a day for neuropathic pain. Patient also on Flexeril prior to admission but held due to some lethargy.  4. Neuropsych:  This patient is capable of making decisions on her own behalf.  5. Nonischemic cardiomyopathy, CAD with pacemaker, placed Sept 2014. No chest pain or shortness of breath  6. Diabetes mellitus with peripheral neuropathy. Hemoglobin A1c 10.7.Increase Lantus insulin 16 units each bedtime.titrate up Check blood sugars a.c. and at bedtime  7. Hypertension. Toprol-XL 25 mg twice a day, Lanoxin 0.125 mg daily. Monitor with increased activity. Recent blood pressure meds held due to some hypotension we'll resume as tolerated  8. Chronic diastolic heart failure. Monitor for any signs of fluid overload. Daily i's and o's and weights. Patient on Lasix 40 mg twice a day prior to admission. Consider resuming back as needed monitor fluid intake 9. Hyperlipidemia. Lipitor 10.  Spasticity start zanaflex at noc, wrist hand orthosis 11.  Urinary retention- started urecholine, voids small amts, add flomax , monitor BP  LOS (Days) 8 A FACE TO FACE EVALUATION WAS PERFORMED  KIRSTEINS,ANDREW E 01/23/2014, 6:55 AM    

## 2014-01-23 NOTE — Progress Notes (Signed)
Inpatient Diabetes Program Recommendations  AACE/ADA: New Consensus Statement on Inpatient Glycemic Control (2013)  Target Ranges:  Prepandial:   less than 140 mg/dL      Peak postprandial:   less than 180 mg/dL (1-2 hours)      Critically ill patients:  140 - 180 mg/dL   Reason for Visit: Hyperglycemia  Results for [name], [name] (MRN [id number]) as of 01/23/2014 14:44  Ref. Range 01/22/2014 14:13 01/22/2014 17:07 01/22/2014 20:48 01/23/2014 07:33 01/23/2014 11:15  Glucose-Capillary Latest Range: 70-99 mg/dL 373 (H) 428 (H) 768 (H) 205 (H) 286 (H)   Lantus increased today to 18 units QHS. Needs meal coverage insulin.  Recommend:  Novolog 4 units tidwc if pt eats >50% meals. Will continue to follow. Thank you. Ailene Ards, RD, LDN, CDE Inpatient Diabetes Coordinator [phone]

## 2014-01-23 NOTE — Progress Notes (Signed)
Social Work Patient ID: [name], female   DOB: 1966-02-14, 48 y.o.   MRN: [id number] Met with pt to discuss team conference goals-min/mod level and discharge 2/24.  Discussed with pt she will require 24 hour care and will her son be comfortable Providing personal care to her.  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  She also has a Physiological scientist prior to admission for three hours which can be resumed at discharge.  Pt talked about how hard it has been with the recent deaths in her Family and how she is trying to push herself and make progress.  She is trying her best and will continue.  It has been one thing after another for her and her family.  She is aware Neuro-psych to see Tomorrow and she is looking forward to the visit.  Will continue to provide support and try to get family in here earlier to begin family training, so feel comfortable with her care at discharge.

## 2014-01-23 NOTE — Progress Notes (Signed)
Physical Therapy Session Note  Patient Details  Name: [name] MRN: [id number] Date of Birth: [birth year]  Today's Date: 01/23/2014 Time: 3704-8889 (Co-tx with AH, PT) Time Calculation (min): 60 min  Short Term Goals: Week 1:  PT Short Term Goal 1 (Week 1): Pt will perform sit>supine with max A using bed rail with HOB flat. PT Short Term Goal 2 (Week 1): Pt will perform supine> sit with mod A using bed rail with HOB flat. PT Short Term Goal 3 (Week 1): Pt will perform bed<>chair transfer with total A of single therapist. PT Short Term Goal 4 (Week 1): Pt will perform w/c mobility x50' in controlled environment with mod A. PT Short Term Goal 5 (Week 1): Pt will perform static sitting x60 seconds without UE support requiring supervision.  Skilled Therapeutic Interventions/Progress Updates:    Skilled PT co-tx with AH, PT. Pt received supine in bed; asleep but easily awakened. Session focused on increasing stability/independence with scooting, functional transfers, and bed mobility. Per pt request to adjust brief, pt performed rolling to L side with supervision, to R side with mod A for LLE management and mod cueing to initiate LUE movement across midline. Supine>sit with HOB flat, bed rail, and max A for bilat LE management, to lift trunk.  Stand pivot transfer from bed>w/c (to L side) with max A, manual stabilization of L knee, manual facilitation of anterior weight shift. Self-propulsion of w/c using R hemi-technique x60' in controlled environment with supervision; w/c mobility trial ended secondary to pt difficulty with RLE propulsion due to inappropriate footwear. Transported pt remaining distance to gym, where pt performed squat pivot transfer from w/c>mat table with total A, manual stabilization as described above. See below for detailed description of NMR performed with pt seated EOM.   Performed squat pivot transfers x3 from mat table<>w/c with min A for tactile cueing at L knee to  promote weightbearing, min verbal cueing for hand placement. Transported pt to room, where pt performed squat pivot from w/c>bed (elevated height) with mod A, tactile cueing at L knee as described above. PT explained, demonstrated technique for LLE management with RLE with supine<>sit. Pt gave effectively initiated said technique during sit>supine, but required mod A to fully lift bilat LE's. Performed scooting to [facility] with mod A for manual facilitation of LLE weightbearing, min cueing for technique. Therapist departed with pt semi-reclined in bed with bed alarm on, 3 bed rails up, RN present, and all needs within reach.  Therapy Documentation Precautions:  Precautions Precautions: Fall;ICD/Pacemaker Precaution Comments: pacemaker; chronic back pain Restrictions Weight Bearing Restrictions: No Pain: Pain Assessment Pain Assessment: 0-10 Pain Score: 2  Pain Type: Acute pain Pain Location: Head Pain Orientation: Anterior Pain Descriptors / Indicators: Headache Pain Onset: Gradual Pain Intervention(s): Other (Comment) (Pt declines medication to manage headache pain) Multiple Pain Sites: No Locomotion : Wheelchair Mobility Distance: 60  Balance:   Seated EOM, pt performed RUE anterolateral reaching with partial standing to promote pt initiation of anterior weight shift with trunk at midline. PT provided tactile cueing at L knee to facilitate LLE weightbearing. Transitioned to RUE anterolateral reaching with scooting in bilat directions for length of mat table twice per direction. Verbal cueing provided to reinforce grading of anterior weight shift required for scooting. During final set, pt required min A for tactile cueing at L knee to facilitate LLE weightbearing.  See FIM for current functional status  Therapy/Group: Co-Treatment  [name] A 01/23/2014, 4:51 PM

## 2014-01-23 NOTE — Progress Notes (Signed)
Orthopedic Tech Progress Note Patient Details:  [name] [birth year] [id number]  Patient ID: [name], female   DOB: Apr 27, 1966, 48 y.o.   MRN: [id number]   [name] 01/23/2014, 10:19 [facility] for left WHO brace.

## 2014-01-23 NOTE — Progress Notes (Signed)
Occupational Therapy Note  Patient Details  Name: [name] MRN: [id number] Date of Birth: 1966-11-12 Today's Date: 01/23/2014  Time: 7915-0569 (cotx with Speech therapy-total time 1130-1155) Pt denies pain Group therapy  Pt engaged in self feeding group with focus on swallowing strategies and attention to left.  Pt required min verbal cues to check for pocketing of food in left cheek.  Pt required [facility] assist for positioning of LUE.  Pt's LUE continues to exhibit increase tone.   Lavone Neri [facility] 01/23/2014, 12:05 PM

## 2014-01-24 ENCOUNTER — Inpatient Hospital Stay (HOSPITAL_COMMUNITY): Payer: Medicaid Other

## 2014-01-24 ENCOUNTER — Encounter (HOSPITAL_COMMUNITY): Payer: Medicaid Other

## 2014-01-24 ENCOUNTER — Inpatient Hospital Stay (HOSPITAL_COMMUNITY): Payer: Medicaid Other | Admitting: Speech Pathology

## 2014-01-24 ENCOUNTER — Inpatient Hospital Stay (HOSPITAL_COMMUNITY): Payer: Medicaid Other | Admitting: Physical Therapy

## 2014-01-24 LAB — GLUCOSE, CAPILLARY
GLUCOSE-CAPILLARY: 206 mg/dL — AB (ref 70–99)
GLUCOSE-CAPILLARY: 222 mg/dL — AB (ref 70–99)
Glucose-Capillary: 337 mg/dL — ABNORMAL HIGH (ref 70–99)
Glucose-Capillary: 178 mg/dL — ABNORMAL HIGH (ref 70–99)

## 2014-01-24 MED ORDER — TIZANIDINE HCL 4 MG PO TABS
4.0000 mg | ORAL_TABLET | Freq: Every day | ORAL | Status: DC
  Administered 2014-01-24 – 2014-02-04 (×12): 4 mg via ORAL
  Filled 2014-01-24 (×13): qty 1

## 2014-01-24 MED ORDER — INSULIN GLARGINE 100 UNIT/ML ~~LOC~~ SOLN
20.0000 [IU] | Freq: Every day | SUBCUTANEOUS | Status: DC
  Administered 2014-01-24 – 2014-02-03 (×11): 20 [IU] via SUBCUTANEOUS
  Filled 2014-01-24 (×11): qty 0.2

## 2014-01-24 NOTE — Progress Notes (Signed)
Subjective/Complaints: 48 y.o. right-handed female with history of uncontrolled hypertension, diabetes mellitus peripheral neuropathy, morbid obesity, nonischemic cardiomyopathy, chronic diastolic heart failure, CAD status post pacemaker placement and chronic back pain followed at outpatient rehabilitation services. Admitted 01/09/2014 with left facial weakness and slurred speech. Patient with previous admission 10/07/2013 with similar symptoms workup negative as per neurology services. Cranial CT scan consistent with possible right middle cerebral artery territory infarct. MRI not completed secondary to pacemaker. Patient did not receive TPA. CTA view of the head negative for hemorrhage did show occluded branch MCA vessel. CTA of the neck was negative.. Echocardiogram with ejection fraction of 40% no wall motion abnormalities.. TEE showed moderately depressed LVEF without thrombus or vegetation. . Currently maintained on aspirin for CVA prophylaxis. Subcutaneous heparin for DVT prophylaxis. Diet has been advanced to a regular consistency  Pt voided small amts but still requiring caths  Review of Systems - Negative except back pain Objective: Vital Signs: Blood pressure 132/92, pulse 93, temperature 97 F (36.1 C), temperature source Oral, resp. rate 20, height $RemoveBe'5\' 9"'fQlajrvBj$  (1.753 m), weight 121.1 kg (266 lb 15.6 oz), last menstrual period 12/21/2013, SpO2 100.00%. No results found. Results for orders placed during the hospital encounter of 01/15/14 (from the past 72 hour(s))  GLUCOSE, CAPILLARY     Status: Abnormal   Collection Time    01/21/14  7:05 AM      Result Value Ref Range   Glucose-Capillary 198 (*) 70 - 99 mg/dL   Comment 1 Notify RN    BASIC METABOLIC PANEL     Status: Abnormal   Collection Time    01/21/14  7:40 AM      Result Value Ref Range   Sodium 138  137 - 147 mEq/L   Potassium 3.7  3.7 - 5.3 mEq/L   Chloride 97  96 - 112 mEq/L   CO2 28  19 - 32 mEq/L   Glucose, Bld 194 (*) 70 -  99 mg/dL   BUN 11  6 - 23 mg/dL   Creatinine, Ser 0.71  0.50 - 1.10 mg/dL   Calcium 9.4  8.4 - 10.5 mg/dL   GFR calc non Af Amer >90  >90 mL/min   GFR calc Af Amer >90  >90 mL/min   Comment: (NOTE)     The eGFR has been calculated using the CKD EPI equation.     This calculation has not been validated in all clinical situations.     eGFR's persistently <90 mL/min signify possible Chronic Kidney     Disease.  GLUCOSE, CAPILLARY     Status: Abnormal   Collection Time    01/21/14 11:16 AM      Result Value Ref Range   Glucose-Capillary 248 (*) 70 - 99 mg/dL   Comment 1 Notify RN    GLUCOSE, CAPILLARY     Status: Abnormal   Collection Time    01/21/14  4:36 PM      Result Value Ref Range   Glucose-Capillary 214 (*) 70 - 99 mg/dL   Comment 1 STAT Lab     Comment 2 Notify RN    GLUCOSE, CAPILLARY     Status: Abnormal   Collection Time    01/21/14  8:28 PM      Result Value Ref Range   Glucose-Capillary 185 (*) 70 - 99 mg/dL   Comment 1 Notify RN    GLUCOSE, CAPILLARY     Status: Abnormal   Collection Time    01/22/14  7:35  AM      Result Value Ref Range   Glucose-Capillary 211 (*) 70 - 99 mg/dL  GLUCOSE, CAPILLARY     Status: Abnormal   Collection Time    01/22/14 11:45 AM      Result Value Ref Range   Glucose-Capillary 241 (*) 70 - 99 mg/dL  GLUCOSE, CAPILLARY     Status: Abnormal   Collection Time    01/22/14  2:13 PM      Result Value Ref Range   Glucose-Capillary 227 (*) 70 - 99 mg/dL  GLUCOSE, CAPILLARY     Status: Abnormal   Collection Time    01/22/14  5:07 PM      Result Value Ref Range   Glucose-Capillary 225 (*) 70 - 99 mg/dL  GLUCOSE, CAPILLARY     Status: Abnormal   Collection Time    01/22/14  8:48 PM      Result Value Ref Range   Glucose-Capillary 229 (*) 70 - 99 mg/dL  GLUCOSE, CAPILLARY     Status: Abnormal   Collection Time    01/23/14  7:33 AM      Result Value Ref Range   Glucose-Capillary 205 (*) 70 - 99 mg/dL   Comment 1 Notify RN     GLUCOSE, CAPILLARY     Status: Abnormal   Collection Time    01/23/14 11:15 AM      Result Value Ref Range   Glucose-Capillary 286 (*) 70 - 99 mg/dL   Comment 1 Notify RN    GLUCOSE, CAPILLARY     Status: Abnormal   Collection Time    01/23/14  4:48 PM      Result Value Ref Range   Glucose-Capillary 179 (*) 70 - 99 mg/dL   Comment 1 Notify RN    GLUCOSE, CAPILLARY     Status: Abnormal   Collection Time    01/23/14  9:20 PM      Result Value Ref Range   Glucose-Capillary 241 (*) 70 - 99 mg/dL     HEENT: white coating on tongue Cardio: RRR and no murmur Resp: CTA B/L and unlabored GI: BS positive and non distended Extremity:  Pulses positive and No Edema Skin:   Intact Neuro: Alert/Oriented, Abnormal Sensory absent to LT LUE and intact LLE, Abnormal Motor 0/5 in LUE and LLE and Other no evidence of L field cut Musc/Skel:  Other back pain  Gen NAD   Assessment/Plan: 1. Functional deficits secondary to Cardioembolic R MCA infarct which require 3+ hours per day of interdisciplinary therapy in a comprehensive inpatient rehab setting. Physiatrist is providing close team supervision and 24 hour management of active medical problems listed below. Physiatrist and rehab team continue to assess barriers to discharge/monitor patient progress toward functional and medical goals. FIM: FIM - Bathing Bathing Steps Patient Completed: Chest;Left Arm;Abdomen;Right upper leg;Left upper leg Bathing: 3: Mod-Patient completes 5-7 59f 10 parts or 50-74%  FIM - Upper Body Dressing/Undressing Upper body dressing/undressing steps patient completed: Thread/unthread right sleeve of pullover shirt/dresss;Put head through opening of pull over shirt/dress Upper body dressing/undressing: 3: Mod-Patient completed 50-74% of tasks FIM - Lower Body Dressing/Undressing Lower body dressing/undressing steps patient completed: Thread/unthread right underwear leg;Thread/unthread right pants leg;Don/Doff right  shoe Lower body dressing/undressing: 2: Max-Patient completed 25-49% of tasks  FIM - Musician Devices: Grab bar or rail for support Toileting: 1: Total-Patient completed zero steps, helper did all 3  FIM - Radio producer Devices: Product manager Transfers:  2-To toilet/BSC: Max A (lift and lower assist);2-From toilet/BSC: Max A (lift and lower assist)  FIM - Engineer, site Assistive Devices: Arm rests;Bed rails Bed/Chair Transfer: 3: Supine > Sit: Mod A (lifting assist/Pt. 50-74%/lift 2 legs;2: Bed > Chair or W/C: Max A (lift and lower assist)  FIM - Locomotion: Wheelchair Distance: 60 Locomotion: Wheelchair: 2: Travels 50 - 149 ft with supervision, cueing or coaxing FIM - Locomotion: Ambulation Locomotion: Ambulation Assistive Devices: Other (comment) (none) Ambulation/Gait Assistance: 1: +2 Total assist (3 musketeers) Locomotion: Ambulation: 0: Activity did not occur  Comprehension Comprehension Mode: Auditory Comprehension: 6-Follows complex conversation/direction: With extra time/assistive device  Expression Expression Mode: Verbal Expression: 5-Expresses complex 90% of the time/cues < 10% of the time  Social Interaction Social Interaction: 5-Interacts appropriately 90% of the time - Needs monitoring or encouragement for participation or interaction.  Problem Solving Problem Solving: 5-Solves basic problems: With no assist  Memory Memory: 6-More than reasonable amt of time  Medical Problem List and Plan:  1. Cardioembolic right MCA infarct, left hemiparesis and LUE sensory def  2. DVT Prophylaxis/Anticoagulation: Subcutaneous heparin. Monitor platelet counts and any signs of bleeding  3. Pain Management/chronic pain syndrome: Tegretol 200 mg twice a day for neuropathic pain. Patient also on Flexeril prior to admission but held due to some lethargy.  4. Neuropsych: This patient is capable of making  decisions on her own behalf.  5. Nonischemic cardiomyopathy, CAD with pacemaker, placed Sept 2014. No chest pain or shortness of breath  6. Diabetes mellitus with peripheral neuropathy. Hemoglobin A1c 10.7.Increase Lantus insulin 20 units each bedtime.titrate up Check blood sugars a.c. and at bedtime  7. Hypertension. Toprol-XL 25 mg twice a day, Lanoxin 0.125 mg daily. Monitor with increased activity. Recent blood pressure meds held due to some hypotension we'll resume as tolerated  8. Chronic diastolic heart failure. Monitor for any signs of fluid overload. Daily i's and o's and weights. Patient on Lasix 40 mg twice a day prior to admission. Consider resuming back as needed monitor fluid intake 9. Hyperlipidemia. Lipitor 10.  Spasticity start zanaflex at noc, wrist hand orthosis 11.  Urinary retention- started urecholine, voids small amts, add flomax , monitor BP  LOS (Days) 9 A FACE TO FACE EVALUATION WAS PERFORMED  Elcie Pelster E 01/24/2014, 6:35 AM

## 2014-01-24 NOTE — Progress Notes (Signed)
Physical Therapy Weekly Progress Note  Patient Details  Name: [name] MRN: [id number] Date of Birth: 02/28/1966  Today's Date: 01/24/2014 Time: 2866-8937 Time Calculation (min): 55 min  Patient has met 4 of 5 short term goals.  Pt demonstrates increased stability/independence with bed mobility, functional transfers, w/c mobility, and sitting balance. Assist required with supine>sit inconsistent due to intermittent need for bilat LE management as well as lifting at trunk.   Patient continues to demonstrate the following deficits: L hemiplegia, decreased cardiorespiratoy endurance, impaired timing and sequencing and unbalanced muscle activation, decreased midline orientation, decreased standing balance, decreased postural control, and decreased balance strategies and therefore will continue to benefit from skilled PT intervention to enhance overall performance with activity tolerance, balance, postural control, ability to compensate for deficits and functional use of  left upper extremity and left lower extremity.  Patient progressing toward long term goals..  Continue plan of care.  PT Short Term Goals Week 1:  PT Short Term Goal 1 (Week 1): Pt will perform sit>supine with max A using bed rail with HOB flat. PT Short Term Goal 1 - Progress (Week 1): Met PT Short Term Goal 2 (Week 1): Pt will perform supine> sit with mod A using bed rail with HOB flat. PT Short Term Goal 2 - Progress (Week 1): Progressing toward goal PT Short Term Goal 3 (Week 1): Pt will perform bed<>chair transfer with total A of single therapist. PT Short Term Goal 3 - Progress (Week 1): Met PT Short Term Goal 4 (Week 1): Pt will perform w/c mobility x50' in controlled environment with mod A. PT Short Term Goal 4 - Progress (Week 1): Met PT Short Term Goal 5 (Week 1): Pt will perform static sitting x60 seconds without UE support requiring supervision. PT Short Term Goal 5 - Progress (Week 1): Met Week 2:  PT Short  Term Goal 1 (Week 2): Pt will consistently perform sit<>supine with mod A using bed rail with HOB flat. PT Short Term Goal 2 (Week 2): Pt will consistently perform  bed<>chair transfer with mod A. PT Short Term Goal 3 (Week 2): Pt will perform w/c mobility in controlled environment x150' with supervision and no rest breaks. PT Short Term Goal 4 (Week 2): Pt will perform static standing with single UE support x1 minute with min A. PT Short Term Goal 5 (Week 2): Pt will perform gait x10' in controlled environment with total A of single therapist.  Skilled Therapeutic Interventions/Progress Updates:    2:1. Pt received seated in w/c. Pt continues to report fatigue (which pt attributes to elevated blood glucose) but agreeable to therapy. Session focused on increasing pt initiation of functional transfers/mobility, dynamic sitting balance. Performed w/c mobility (from pt room<>gym) 2x40' in controlled environment with R hemi technique and supervision; w/c mobility trials ended secondary to pt fatigue.  Once in gym, performed squat pivot from w/c>mat with mod A, mod verbal cueing for initiation of anterior weight shift, tactile cueing at L knee to promote weightbearing. See below for detailed description of NMR performed while seated EOM. Following NMR, performed squat pivot from mat table>w/c with mod A, mod verbal cueing for hand placement.  Pt noted to have been incontinent of bladder. Transported pt to room in w/c secondary to pt fatigue. Once in room, performed w/c>bed transfer (to L side) with max A and tactile cueing at L knee. Sit>supine with mod A for bilat LE management. Notified nurse tech of incontinent episode, pt need to change out  of soiled clothing. Therapist departed with pt supine in bed and nurse tech, rehab tech present to assist pt with hygiene, clothing change.  Therapy Documentation Precautions:  Precautions Precautions: Fall;ICD/Pacemaker Precaution Comments: pacemaker; chronic back  pain Restrictions Weight Bearing Restrictions: No Pain: Pain Assessment Pain Assessment: No/denies pain NMR:    Seated EOM, pt performed scooting in bilat directions (x1 length of mat table per direction) initiated by RUE anterolateral reaching to promote active anterior weight shift with trunk at midline. PT initially provided manual stabilization of L knee, transitioned to tactile cueing at L knee to facilitate LLE weightbearing. When scooting performed without reaching/targets, pt required increased verbal cueing for carryover of weight shifting. After rest break x3 minutes, pt performed sit>stands x5 initiated by RUE anterolateral reaching with tactile cueing as described above. Pt required min-mod A, mod encouragement secondary to pt fatigue. Static standing trials x3 trials (each ~5-8 seconds in duration) without UE support requiring max A and manual stabilization of L knee, manual facilitation at R ribs and L axilla for postural normalization. With verbal cueing, pt able to actively extend L hip/knee during final static standing trial.  See FIM for current functional status  Therapy/Group: Individual Therapy  Stefano Gaul 01/24/2014, 6:05 PM

## 2014-01-24 NOTE — Progress Notes (Signed)
Pt is refusing CPAP tonight. RT will monitor +

## 2014-01-24 NOTE — Progress Notes (Signed)
Occupational Therapy Session Note  Patient Details  Name: [name] MRN: [id number] Date of Birth: November 28, 1966  Today's Date: 01/24/2014 Time: 0830-0930 Time Calculation (min): 60 min  Short Term Goals: Week 2:  OT Short Term Goal 1 (Week 2): Pt will complete tub transfer using TTB with max assist OT Short Term Goal 2 (Week 2): Pt will complete UB dressing with min assist  OT Short Term Goal 3 (Week 2): Pt will complete toilet task with max assist OT Short Term Goal 4 (Week 2): Pt will complete sit<>stand with mod assist for initiation and balance for 1 grooming task.   Skilled Therapeutic Interventions/Progress Updates:    Pt seen for ADL retraining with focus on intiating pt/family education/training (daughter), transfers, and postural in standing. Pt received supine in bed with daughter present and requesting to complete toilet task. Completed stand pivot transfer bed>BSC to L with max assist initially then +2 during pivot with min assist from second person. Pt completed scooting on BSC with min cueing and min assist for positioning of LLE to increase comfort on BSC. Pt completed remaining stand pivot transfers max assist (both L and R) with cues for positioning of RUE as was trying to sustain grip on bed rail. Provided education to daughter and pt regarding stroke recovery. Discussed home setup and assistance at home. Daughter is well aware that pt will need physical assistance at home and reports she is currently not working and will be able to assist more. Practiced sit<>stand with daughter to introduce training and make aware of physical needs from pt. Daughter completed with max assist and min cues from therapist for postural control for pt in standing for glute activation. Daughter with good carryover of techniques for body mechanics during transfers. Pt also providing verbal cues for placement of LLE. Discussed importance of having daughter and other family who will be assisting to  continue coming into therapy for hands on practice and she was agreeable. Completed bathing and dressing while sitting EOB with focus on anterior weight shift during LB dressing. Daughter attempting to assist too much and therapist had to provided cues and intervention to allow pt to engage more. Pt fitted for resting hand splint during session and daughter present. Discussed wearing schedule with pt and daughter declining practice to place splint on pt. Both feel comfortable with donning splint. Pt with much brighter affect during therapy session and therapist seeing pt laugh multiple times. At end of session pt left sitting in w/c with all needs in reach.   Therapy Documentation Precautions:  Precautions Precautions: Fall;ICD/Pacemaker Precaution Comments: pacemaker; chronic back pain Restrictions Weight Bearing Restrictions: No General:   Vital Signs: Therapy Vitals Pulse Rate: 88 BP: 148/90 mmHg Pain: Pt reporting "burning" sensation in upper RLE. RN aware and slightly warm to touch. No pain noted when standing.   See FIM for current functional status  Therapy/Group: Individual Therapy  Daneil Dan 01/24/2014, 11:53 AM

## 2014-01-24 NOTE — Progress Notes (Signed)
Speech Language Pathology Daily Session Note  Patient Details  Name: [name] MRN: [id number] Date of Birth: 11/29/1966  Today's Date: 01/24/2014 Time: 1000-1043 Time Calculation (min): 43 min  Short Term Goals: Week 2: SLP Short Term Goal 1 (Week 2): Pt will demonstrate efficient mastication and oral clearance with trials of regular textures with Mod I SLP Short Term Goal 2 (Week 2): Pt will demonstrate functional problem solving for complex tasks with supervision verbal cues.  SLP Short Term Goal 3 (Week 2): Pt will utilize speech intelligibility strategies at the sentence level with Mod I.  SLP Short Term Goal 4 (Week 2): Pt will perfrom lingual and labial oral-motor exercises with Min A verbal and visual cues.  SLP Short Term Goal 5 (Week 2): Pt will attend to left field of enviornment/body during functional tasks with mod I.   Skilled Therapeutic Interventions: Skilled treatment focused on cognitive goals. SLP facilitated session with complex money management task. Pt required Max faded to Mod cues for complex problem solving and organization. She demonstrated emergent awareness and self-monitoring of errors with Mod I, however required cueing to correct. Pt verbalized her need to go to the bathroom with Mod I and followed instructions and sequencing throughout transfer and toileting with Mod I. Continue plan of care.   FIM:  Comprehension Comprehension Mode: Auditory Comprehension: 5-Follows basic conversation/direction: With no assist Expression Expression Mode: Verbal Expression: 5-Expresses complex 90% of the time/cues < 10% of the time Social Interaction Social Interaction: 6-Interacts appropriately with others with medication or extra time (anti-anxiety, antidepressant). Problem Solving Problem Solving: 5-Solves basic problems: With no assist Memory Memory: 6-More than reasonable amt of time  Pain Pain Assessment Pain Assessment: No/denies pain  Therapy/Group:  Individual Therapy   Maxcine Ham, M.A. CCC-SLP [phone]   Maxcine Ham 01/24/2014, 12:07 PM

## 2014-01-24 NOTE — Progress Notes (Signed)
Speech Language Pathology Daily Session Note  Patient Details  Name: [name] MRN: [id number] Date of Birth: 1966/06/16  Today's Date: 01/24/2014 Time: 1130-1155 Time Calculation (min): 25 min  Short Term Goals: Week 2: SLP Short Term Goal 1 (Week 2): Pt will demonstrate efficient mastication and oral clearance with trials of regular textures with Mod I SLP Short Term Goal 2 (Week 2): Pt will demonstrate functional problem solving for complex tasks with supervision verbal cues.  SLP Short Term Goal 3 (Week 2): Pt will utilize speech intelligibility strategies at the sentence level with Mod I.  SLP Short Term Goal 4 (Week 2): Pt will perfrom lingual and labial oral-motor exercises with Min A verbal and visual cues.  SLP Short Term Goal 5 (Week 2): Pt will attend to left field of enviornment/body during functional tasks with mod I.   Skilled Therapeutic Interventions: Pt participated in diners club with treatment focus on dysphagia and cognitive goals and self-feeding. Pt consumed regular textures with thin liquids via straw without overt s/s of aspiration and required supervision verbal cues to self-monitor left buccal pocketing. Pt continues to demonstrate decreased PO intake with meals. Pt initiated conversation with other group members with appropriate social interaction and humor. Continue with current plan of care.    FIM:  Comprehension Comprehension Mode: Auditory Comprehension: 5-Follows basic conversation/direction: With no assist Expression Expression Mode: Verbal Expression: 5-Expresses complex 90% of the time/cues < 10% of the time Social Interaction Social Interaction: 6-Interacts appropriately with others with medication or extra time (anti-anxiety, antidepressant). Problem Solving Problem Solving: 5-Solves basic problems: With no assist Memory Memory: 6-More than reasonable amt of time FIM - Eating Eating Activity: 5: Supervision/cues;5: Set-up assist for open  containers  Pain Pain Assessment Pain Assessment: No/denies pain  Therapy/Group: Individual Therapy  Lucindy Borel 01/24/2014, 3:24 PM

## 2014-01-24 NOTE — Progress Notes (Signed)
Pt. Refuses CPAP at this time. Pt. Was made aware to let RT/RN know anytime during the night if she changed her mind & decided to wear CPAP.

## 2014-01-24 NOTE — Progress Notes (Signed)
Last void at 1600 per report. Pt attempted to void earlier this shift without success. At 0215 I&O cath for 300cc amber colored urine. Mick Sell RN

## 2014-01-25 ENCOUNTER — Inpatient Hospital Stay (HOSPITAL_COMMUNITY): Payer: Medicaid Other

## 2014-01-25 ENCOUNTER — Inpatient Hospital Stay (HOSPITAL_COMMUNITY): Payer: Medicaid Other | Admitting: Physical Therapy

## 2014-01-25 DIAGNOSIS — I10 Essential (primary) hypertension: Secondary | ICD-10-CM

## 2014-01-25 DIAGNOSIS — I6340 Cerebral infarction due to embolism of unspecified cerebral artery: Secondary | ICD-10-CM

## 2014-01-25 DIAGNOSIS — I5032 Chronic diastolic (congestive) heart failure: Secondary | ICD-10-CM

## 2014-01-25 DIAGNOSIS — E1142 Type 2 diabetes mellitus with diabetic polyneuropathy: Secondary | ICD-10-CM

## 2014-01-25 DIAGNOSIS — G8110 Spastic hemiplegia affecting unspecified side: Secondary | ICD-10-CM

## 2014-01-25 DIAGNOSIS — E1149 Type 2 diabetes mellitus with other diabetic neurological complication: Secondary | ICD-10-CM

## 2014-01-25 DIAGNOSIS — I428 Other cardiomyopathies: Secondary | ICD-10-CM

## 2014-01-25 LAB — GLUCOSE, CAPILLARY
GLUCOSE-CAPILLARY: 235 mg/dL — AB (ref 70–99)
GLUCOSE-CAPILLARY: 290 mg/dL — AB (ref 70–99)
Glucose-Capillary: 250 mg/dL — ABNORMAL HIGH (ref 70–99)
Glucose-Capillary: 259 mg/dL — ABNORMAL HIGH (ref 70–99)

## 2014-01-25 NOTE — Progress Notes (Signed)
Inpatient Diabetes Program Recommendations  AACE/ADA: New Consensus Statement on Inpatient Glycemic Control (2013)  Target Ranges:  Prepandial:   less than 140 mg/dL      Peak postprandial:   less than 180 mg/dL (1-2 hours)      Critically ill patients:  140 - 180 mg/dL   Reason for Visit: Hyperglycemia  Results for [facility], [name] (MRN [id number]) as of 01/25/2014 12:43  Ref. Range 01/24/2014 12:10 01/24/2014 16:24 01/24/2014 20:45 01/25/2014 07:27 01/25/2014 11:33  Glucose-Capillary Latest Range: 70-99 mg/dL 638 (H) 453 (H) 646 (H) 250 (H) 235 (H)   Poor po intake. Blood sugars remain elevated.   Increase Lantus to 25 units QHS. If po intake increases, add Novolog 3 units tidwc if pt eats >50% meal.  Will continue to follow. Thank you. Ailene Ards, RD, LDN, CDE Inpatient Diabetes Coordinator [phone]

## 2014-01-25 NOTE — Progress Notes (Signed)
Physical Therapy Session Note  Patient Details  Name: [name] MRN: [id number] Date of Birth: 23-May-1966  Today's Date: 01/25/2014 Time: 1165-7903 Time Calculation (min): 44 min  Short Term Goals: Week 2:  PT Short Term Goal 1 (Week 2): Pt will consistently perform sit<>supine with mod A using bed rail with HOB flat. PT Short Term Goal 2 (Week 2): Pt will consistently perform  bed<>chair transfer with mod A. PT Short Term Goal 3 (Week 2): Pt will perform w/c mobility in controlled environment x150' with supervision and no rest breaks. PT Short Term Goal 4 (Week 2): Pt will perform static standing with single UE support x1 minute with min A. PT Short Term Goal 5 (Week 2): Pt will perform gait x10' in controlled environment with total A of single therapist.  Skilled Therapeutic Interventions/Progress Updates:    Pt received seated in w/c with aunt, [name], present; agreeable to session. Reports feeling physically better, less fatigued; however, pt with brief tearful episode at beginning of session secondary to pt difficulty coping with recent death of sister. Pt performed w/c mobility 2x150' (to/from pt room) in controlled environment, no rest breaks, with R hemi technique and supervision.   Pt performed gait x34', x48' in controlled environment with +2A (3 musketeers assist) and w/c follow. Therapists positioned under bilat UE's providing manual facilitation of the following: R weight shift prior to LLE advancement, LLE advancement, manual stabilization of L knee during LLE stance. Verbal cueing provided to promote RLE step length, to increase R weight shift prior to LLE advancement. In pt room, performed squat pivot transfer from w/c>bed with mod A. Sit>supine with mod A for bilat LE management. Therapists departed with pt supine in bed with 3 bed rails up, bed alarm on, aunt present, and all needs within reach.   Therapy Documentation Precautions:  Precautions Precautions:  Fall;ICD/Pacemaker Precaution Comments: pacemaker; chronic back pain Restrictions Weight Bearing Restrictions: No General: Amount of Missed PT Time (min): 16 Minutes Missed Time Reason: Other (comment) (Pt's lunch tray arrived late; pt requested to eat prior to PT session) Vital Signs: Therapy Vitals Temp: 98 F (36.7 C) Temp src: Oral Pulse Rate: 83 Resp: 18 BP: 133/88 mmHg Patient Position, if appropriate: Lying Oxygen Therapy SpO2: 98 % O2 Device: None (Room air) Pain: Pain Assessment Pain Assessment: No/denies pain Pain Score: 0-No pain Locomotion : Ambulation Ambulation/Gait Assistance: 1: +2 Total assist (3 musketeers) Wheelchair Mobility Distance: 150   See FIM for current functional status  Therapy/Group: Individual Therapy  Lauralye Kinn, Lorenda Ishihara 01/25/2014, 4:05 PM

## 2014-01-25 NOTE — Progress Notes (Signed)
Occupational Therapy Session Note  Patient Details  Name: [name] MRN: [id number] Date of Birth: January 30, 1966  Today's Date: 01/25/2014 Time: 0900-1015 Time Calculation (min): 75 min  Short Term Goals: Week 2:  OT Short Term Goal 1 (Week 2): Pt will complete tub transfer using TTB with max assist OT Short Term Goal 2 (Week 2): Pt will complete UB dressing with min assist  OT Short Term Goal 3 (Week 2): Pt will complete toilet task with max assist OT Short Term Goal 4 (Week 2): Pt will complete sit<>stand with mod assist for initiation and balance for 1 grooming task.   Skilled Therapeutic Interventions/Progress Updates:    Pt seen for ADL retraining with focus on weight bearing through LUE, anterior weight shift, adaptive strategies for dressing and bathing, passive stretching, and sit<>stand. Pt seemed very distracted throughout session and reporting having difficulties with daughter at this time however not wanting to discuss. Pt with much flatter affect this AM and reported daughter causing unwanted stress at this time. Pt reported completed peri hygiene while supine in bed PTA and wishes to continue this. Pt completed and therapist assisted for thoroughness. Pt declining shower this AM however willing to wash EOB. Completed sit>supine with mod assist, no bed rail and HOB flat. Provided passive stretching to LUE with emphasis on external rotation and elbow extension. Discussed positioning of LUE while sitting in w/c and supine in bed. During bathing, engaged in some weight bearing through LUE and HOH assist for applying lotion to LLE. Utilized adaptive strategies for washing RUE and lower BLE using stool to increase independence. Focused on anterior weight shift during LB self-care tasks as pt reached to floor 2x times to pick up waist band and coming completely upright at supervision level. Completed sit<>stand x3 progressing to mod assist with verbal cues for hand placement and postural  control in standing. Completed squat pivot transfer to right side with mod assist and several attempts as pt with decreased initiation and cues for anterior weight shift. At end of session pt left sitting in w/c with all needs in reach.   Therapy Documentation Precautions:  Precautions Precautions: Fall;ICD/Pacemaker Precaution Comments: pacemaker; chronic back pain Restrictions Weight Bearing Restrictions: No General:   Vital Signs: Therapy Vitals Pulse Rate: 83 BP: 141/82 mmHg Patient Position, if appropriate: Lying Pain: Pt reporting pain in various body parts and had just received pain meds.   See FIM for current functional status  Therapy/Group: Individual Therapy  Daneil Dan 01/25/2014, 11:30 AM

## 2014-01-25 NOTE — Progress Notes (Signed)
Note reviewed and accurately reflects treatment session.   

## 2014-01-25 NOTE — Progress Notes (Signed)
Speech Language Pathology Daily Session Note  Patient Details  Name: [name] MRN: [id number] Date of Birth: [birth year]  Today's Date: 01/25/2014 Time: 1030-1110 Time Calculation (min): 40 min  Short Term Goals: Week 2: SLP Short Term Goal 1 (Week 2): Pt will demonstrate efficient mastication and oral clearance with trials of regular textures with Mod I SLP Short Term Goal 2 (Week 2): Pt will demonstrate functional problem solving for complex tasks with supervision verbal cues.  SLP Short Term Goal 3 (Week 2): Pt will utilize speech intelligibility strategies at the sentence level with Mod I.  SLP Short Term Goal 4 (Week 2): Pt will perfrom lingual and labial oral-motor exercises with Min A verbal and visual cues.  SLP Short Term Goal 5 (Week 2): Pt will attend to left field of enviornment/body during functional tasks with mod I.   Skilled Therapeutic Interventions: Skilled treatment focused on dysphagia and cognitive goals. SLP facilitated session with advanced trials of regular textures consumed with thin liquids via straw sips. Pt demonstrated adequate mastication and oral clearance of minimal regular textures, however exhibited immediate cough x1 following thin liquid trial. Pt completed the remainder of checkbook activity from previous day with supervision-Min cues for complex problem solving and organization. Pt demonstrated anticipatory awareness with Mod I by requesting to return to her room to attempt toileting prior to feeling urgency. Session ended ~5 minutes early, as pt became emotional upon return to her room, stating familial issues and sadness related to her recent losses. SLP provided encouragement and emotional support. Continue plan of care.   FIM:  Comprehension Comprehension Mode: Auditory Comprehension: 5-Follows basic conversation/direction: With no assist Expression Expression Mode: Verbal Expression: 5-Expresses complex 90% of the time/cues < 10% of the  time Social Interaction Social Interaction: 6-Interacts appropriately with others with medication or extra time (anti-anxiety, antidepressant). Problem Solving Problem Solving: 5-Solves basic problems: With no assist Memory Memory: 6-More than reasonable amt of time FIM - Eating Eating Activity: 5: Supervision/cues;5: Set-up assist for open containers  Pain Pain Assessment Pain Assessment: No/denies pain Pain Score: 8   Therapy/Group: Individual Therapy   Maxcine Ham, M.A. CCC-SLP [phone]   Maxcine Ham 01/25/2014, 1:10 PM

## 2014-01-25 NOTE — Progress Notes (Signed)
Nursing Note: Pt up to the bedside commode.Unable to void in 8 hrs.R: In and out cathed for 150 cc of amber concentrated urine by this nurse.Pt tolerated well.wbb

## 2014-01-25 NOTE — Progress Notes (Signed)
Occupational Therapy Session Note  Patient Details  Name: FELISHIA WARTMAN MRN: [id number] Date of Birth: 05-19-1966  Today's Date: 01/25/2014 Time: 1300-1300 Time Calculation (min): 30 min  Short Term Goals: Week 2:  OT Short Term Goal 1 (Week 2): Pt will complete tub transfer using TTB with max assist OT Short Term Goal 2 (Week 2): Pt will complete UB dressing with min assist  OT Short Term Goal 3 (Week 2): Pt will complete toilet task with max assist OT Short Term Goal 4 (Week 2): Pt will complete sit<>stand with mod assist for initiation and balance for 1 grooming task.   Skilled Therapeutic Interventions/Progress Updates:    Pt was in bed upon arrival and transferred to sitting EOB with max A.  Pt participated in PROM of LUE in supination and pronation, crossing midline to touch shoulder and extended back out, flex/ext of bicep and shoulder flex/ext 5-6 reps for each exercise. Pt was able to initiate minimal shoulder flexion after approx. 2 reps.   Pt participated in standing from EOB to work on dynamic standing balance with max A 3 reps.  Pt had no c/o pain.    Therapy Documentation Precautions:  Precautions Precautions: Fall;ICD/Pacemaker Precaution Comments: pacemaker; chronic back pain Restrictions Weight Bearing Restrictions: No     See FIM for current functional status  Therapy/Group: Individual Therapy  Pastor Sgro 01/25/2014, 2:42 PM

## 2014-01-25 NOTE — Progress Notes (Signed)
Subjective/Complaints: 48 y.o. right-handed female with history of uncontrolled hypertension, diabetes mellitus peripheral neuropathy, morbid obesity, nonischemic cardiomyopathy, chronic diastolic heart failure, CAD status post pacemaker placement and chronic back pain followed at outpatient rehabilitation services. Admitted 01/09/2014 with left facial weakness and slurred speech. Patient with previous admission 10/07/2013 with similar symptoms workup negative as per neurology services. Cranial CT scan consistent with possible right middle cerebral artery territory infarct. MRI not completed secondary to pacemaker. Patient did not receive TPA. CTA view of the head negative for hemorrhage did show occluded branch MCA vessel. CTA of the neck was negative.. Echocardiogram with ejection fraction of 40% no wall motion abnormalities.. TEE showed moderately depressed LVEF without thrombus or vegetation. . Currently maintained on aspirin for CVA prophylaxis. Subcutaneous heparin for DVT prophylaxis. Diet has been advanced to a regular consistency  Slept ok, no new issues in therapy No spasms  Review of Systems - Negative except back pain Objective: Vital Signs: Blood pressure 131/85, pulse 100, temperature 98.3 F (36.8 C), temperature source Oral, resp. rate 18, height 5\' 9"  (1.753 m), weight 121.1 kg (266 lb 15.6 oz), last menstrual period 12/21/2013, SpO2 100.00%. No results found. Results for orders placed during the hospital encounter of 01/15/14 (from the past 72 hour(s))  GLUCOSE, CAPILLARY     Status: Abnormal   Collection Time    01/22/14  7:35 AM      Result Value Ref Range   Glucose-Capillary 211 (*) 70 - 99 mg/dL  GLUCOSE, CAPILLARY     Status: Abnormal   Collection Time    01/22/14 11:45 AM      Result Value Ref Range   Glucose-Capillary 241 (*) 70 - 99 mg/dL  GLUCOSE, CAPILLARY     Status: Abnormal   Collection Time    01/22/14  2:13 PM      Result Value Ref Range   Glucose-Capillary  227 (*) 70 - 99 mg/dL  GLUCOSE, CAPILLARY     Status: Abnormal   Collection Time    01/22/14  5:07 PM      Result Value Ref Range   Glucose-Capillary 225 (*) 70 - 99 mg/dL  GLUCOSE, CAPILLARY     Status: Abnormal   Collection Time    01/22/14  8:48 PM      Result Value Ref Range   Glucose-Capillary 229 (*) 70 - 99 mg/dL  GLUCOSE, CAPILLARY     Status: Abnormal   Collection Time    01/23/14  7:33 AM      Result Value Ref Range   Glucose-Capillary 205 (*) 70 - 99 mg/dL   Comment 1 Notify RN    GLUCOSE, CAPILLARY     Status: Abnormal   Collection Time    01/23/14 11:15 AM      Result Value Ref Range   Glucose-Capillary 286 (*) 70 - 99 mg/dL   Comment 1 Notify RN    GLUCOSE, CAPILLARY     Status: Abnormal   Collection Time    01/23/14  4:48 PM      Result Value Ref Range   Glucose-Capillary 179 (*) 70 - 99 mg/dL   Comment 1 Notify RN    GLUCOSE, CAPILLARY     Status: Abnormal   Collection Time    01/23/14  9:20 PM      Result Value Ref Range   Glucose-Capillary 241 (*) 70 - 99 mg/dL  GLUCOSE, CAPILLARY     Status: Abnormal   Collection Time    01/24/14  7:34  AM      Result Value Ref Range   Glucose-Capillary 206 (*) 70 - 99 mg/dL   Comment 1 Notify RN    GLUCOSE, CAPILLARY     Status: Abnormal   Collection Time    01/24/14 12:10 PM      Result Value Ref Range   Glucose-Capillary 337 (*) 70 - 99 mg/dL   Comment 1 Notify RN    GLUCOSE, CAPILLARY     Status: Abnormal   Collection Time    01/24/14  4:24 PM      Result Value Ref Range   Glucose-Capillary 222 (*) 70 - 99 mg/dL  GLUCOSE, CAPILLARY     Status: Abnormal   Collection Time    01/24/14  8:45 PM      Result Value Ref Range   Glucose-Capillary 178 (*) 70 - 99 mg/dL     HEENT: white coating on tongue Cardio: RRR and no murmur Resp: CTA B/L and unlabored GI: BS positive and non distended Extremity:  Pulses positive and No Edema Skin:   Intact Neuro: Alert/Oriented, Abnormal Sensory absent to LT LUE and  intact LLE, Abnormal Motor 0/5 in LUE and LLE and Other no evidence of L field cut Musc/Skel:  Other back pain  Gen NAD   Assessment/Plan: 1. Functional deficits secondary to Cardioembolic R MCA infarct which require 3+ hours per day of interdisciplinary therapy in a comprehensive inpatient rehab setting. Physiatrist is providing close team supervision and 24 hour management of active medical problems listed below. Physiatrist and rehab team continue to assess barriers to discharge/monitor patient progress toward functional and medical goals. FIM: FIM - Bathing Bathing Steps Patient Completed: Chest;Left Arm;Abdomen;Right upper leg;Left upper leg Bathing: 3: Mod-Patient completes 5-7 4f 10 parts or 50-74%  FIM - Upper Body Dressing/Undressing Upper body dressing/undressing steps patient completed: Thread/unthread right sleeve of pullover shirt/dresss;Put head through opening of pull over shirt/dress Upper body dressing/undressing: 3: Mod-Patient completed 50-74% of tasks FIM - Lower Body Dressing/Undressing Lower body dressing/undressing steps patient completed: Thread/unthread right pants leg;Don/Doff right sock Lower body dressing/undressing: 2: Max-Patient completed 25-49% of tasks  FIM - Toileting Toileting steps completed by patient:  (was wearing hospital gown ) Toileting Assistive Devices: Grab bar or rail for support Toileting: 1: Total-Patient completed zero steps, helper did all 3  FIM - Diplomatic Services operational officer Devices: Grab bars;Bedside commode Toilet Transfers: 1-To toilet/BSC: Total A (helper does all/Pt. < 25%);2-From toilet/BSC: Max A (lift and lower assist);1-Two helpers  FIM - Banker Devices: Arm rests Bed/Chair Transfer: 3: Supine > Sit: Mod A (lifting assist/Pt. 50-74%/lift 2 legs;3: Sit > Supine: Mod A (lifting assist/Pt. 50-74%/lift 2 legs);3: Bed > Chair or W/C: Mod A (lift or lower assist);3: Chair or  W/C > Bed: Mod A (lift or lower assist)  FIM - Locomotion: Wheelchair Distance: 40 Locomotion: Wheelchair: 1: Travels less than 50 ft with supervision, cueing or coaxing FIM - Locomotion: Ambulation Locomotion: Ambulation Assistive Devices: Other (comment) (none) Ambulation/Gait Assistance: 1: +2 Total assist (3 musketeers) Locomotion: Ambulation: 0: Activity did not occur  Comprehension Comprehension Mode: Auditory Comprehension: 5-Follows basic conversation/direction: With no assist  Expression Expression Mode: Verbal Expression: 5-Expresses complex 90% of the time/cues < 10% of the time  Social Interaction Social Interaction: 6-Interacts appropriately with others with medication or extra time (anti-anxiety, antidepressant).  Problem Solving Problem Solving: 5-Solves basic problems: With no assist  Memory Memory: 6-More than reasonable amt of time  Medical Problem List  and Plan:  1. Cardioembolic right MCA infarct, left hemiparesis and LUE sensory def  2. DVT Prophylaxis/Anticoagulation: Subcutaneous heparin. Monitor platelet counts and any signs of bleeding  3. Pain Management/chronic pain syndrome: Tegretol 200 mg twice a day for neuropathic pain. Patient also on Flexeril prior to admission but held due to some lethargy.  4. Neuropsych: This patient is capable of making decisions on her own behalf.  5. Nonischemic cardiomyopathy, CAD with pacemaker, placed Sept 2014. No chest pain or shortness of breath  6. Diabetes mellitus with peripheral neuropathy. Hemoglobin A1c 10.7.Increase Lantus insulin 20 units each bedtime.titrate up Check blood sugars a.c. and at bedtime  7. Hypertension. Toprol-XL 25 mg twice a day, Lanoxin 0.125 mg daily. Monitor with increased activity. Recent blood pressure meds held due to some hypotension we'll resume as tolerated  8. Chronic diastolic heart failure. Monitor for any signs of fluid overload. Daily i's and o's and weights. Patient on Lasix 40 mg  twice a day prior to admission. Consider resuming back as needed monitor fluid intake 9. Hyperlipidemia. Lipitor 10.  Spasticity increase zanaflex at noc helpful 11.  Urinary retention- started urecholine, voids small amts, add flomax , monitor BP  LOS (Days) 10 A FACE TO FACE EVALUATION WAS PERFORMED  [name] 01/25/2014, 6:14 AM

## 2014-01-25 NOTE — Plan of Care (Signed)
Problem: RH BLADDER ELIMINATION Goal: RH STG MANAGE BLADDER WITH ASSISTANCE STG Manage Bladder With mod Assistance  On urecholine but requires in and out caths,by staff.

## 2014-01-26 ENCOUNTER — Inpatient Hospital Stay (HOSPITAL_COMMUNITY): Payer: Medicaid Other | Admitting: Physical Therapy

## 2014-01-26 LAB — GLUCOSE, CAPILLARY
GLUCOSE-CAPILLARY: 256 mg/dL — AB (ref 70–99)
GLUCOSE-CAPILLARY: 297 mg/dL — AB (ref 70–99)
Glucose-Capillary: 293 mg/dL — ABNORMAL HIGH (ref 70–99)
Glucose-Capillary: 253 mg/dL — ABNORMAL HIGH (ref 70–99)

## 2014-01-26 NOTE — Progress Notes (Signed)
Physical Therapy Session Note  Patient Details  Name: YASHIKA MASK MRN: [id number] Date of Birth: [birth year]  Today's Date: 01/26/2014 Time: 1330-1430 Time Calculation (min): 60 min  Short Term Goals: Week 1:  PT Short Term Goal 1 (Week 1): Pt will perform sit>supine with max A using bed rail with HOB flat. PT Short Term Goal 1 - Progress (Week 1): Met PT Short Term Goal 2 (Week 1): Pt will perform supine> sit with mod A using bed rail with HOB flat. PT Short Term Goal 2 - Progress (Week 1): Progressing toward goal PT Short Term Goal 3 (Week 1): Pt will perform bed<>chair transfer with total A of single therapist. PT Short Term Goal 3 - Progress (Week 1): Met PT Short Term Goal 4 (Week 1): Pt will perform w/c mobility x50' in controlled environment with mod A. PT Short Term Goal 4 - Progress (Week 1): Met PT Short Term Goal 5 (Week 1): Pt will perform static sitting x60 seconds without UE support requiring supervision. PT Short Term Goal 5 - Progress (Week 1): Met   Therapy Documentation Precautions:  Precautions Precautions: Fall;ICD/Pacemaker Precaution Comments: pacemaker; chronic back pain Restrictions Weight Bearing Restrictions: No Pain: denies pain  Therapeutic Exercise: (30') Nu-Step x 5' at Level 5, then B LE's with AAROM L LE including manually resisted L hip abduction in sitting.  Therapeutic Activity:(15') Transfer training sit<->stand from recliner, and from w/c with mod-assist initially and min-assist after multiple transfers. Transfers recliner->w/c with Mod-assist, w/c<->Nu-Step with Mod-assist   Wheelchair Management:(15') Propelling w/c 2 x 150' using R UE/LE with S/Independent along with negotiating turns. Locks brake on R side S/Independent.  Therapy/Group: Individual Therapy  Clearence Ped 01/26/2014, 1:53 PM

## 2014-01-26 NOTE — Progress Notes (Signed)
Pt refuses CPAP tonight. RT will continue to monitor.  

## 2014-01-26 NOTE — Progress Notes (Signed)
Large continent BM after PRN sorbitol given. Voiding continent and incontinent of urine. At 0148 bladder scan=184. No void after 8 hours, multi attempts to I & O cath by 2 staff members. Voided large incontinent void after cath attempts. At 2100 BP-145/100, reports on phone with someone that gets her BP up. At 2210 BP=119/83. [name] A

## 2014-01-26 NOTE — Progress Notes (Addendum)
Subjective/Complaints: 48 y.o. right-handed female with history of uncontrolled hypertension, diabetes mellitus peripheral neuropathy, morbid obesity, nonischemic cardiomyopathy, chronic diastolic heart failure, CAD status post pacemaker placement and chronic back pain followed at outpatient rehabilitation services. Admitted 01/09/2014 with left facial weakness and slurred speech. Patient with previous admission 10/07/2013 with similar symptoms workup negative as per neurology services. Cranial CT scan consistent with possible right middle cerebral artery territory infarct. MRI not completed secondary to pacemaker. Patient did not receive TPA. CTA view of the head negative for hemorrhage did show occluded branch MCA vessel. CTA of the neck was negative.. Echocardiogram with ejection fraction of 40% no wall motion abnormalities.. TEE showed moderately depressed LVEF without thrombus or vegetation. . Currently maintained on aspirin for CVA prophylaxis. Subcutaneous heparin for DVT prophylaxis. Diet has been advanced to a regular consistency  Asking about prescriptions, voiding!  Review of Systems - Negative except back pain Objective: Vital Signs: Blood pressure 120/85, pulse 88, temperature 98.2 F (36.8 C), temperature source Oral, resp. rate 17, height 5\' 9"  (1.753 m), weight 121.1 kg (266 lb 15.6 oz), last menstrual period 12/21/2013, SpO2 96.00%. No results found. Results for orders placed during the hospital encounter of 01/15/14 (from the past 72 hour(s))  GLUCOSE, CAPILLARY     Status: Abnormal   Collection Time    01/23/14  7:33 AM      Result Value Ref Range   Glucose-Capillary 205 (*) 70 - 99 mg/dL   Comment 1 Notify RN    GLUCOSE, CAPILLARY     Status: Abnormal   Collection Time    01/23/14 11:15 AM      Result Value Ref Range   Glucose-Capillary 286 (*) 70 - 99 mg/dL   Comment 1 Notify RN    GLUCOSE, CAPILLARY     Status: Abnormal   Collection Time    01/23/14  4:48 PM       Result Value Ref Range   Glucose-Capillary 179 (*) 70 - 99 mg/dL   Comment 1 Notify RN    GLUCOSE, CAPILLARY     Status: Abnormal   Collection Time    01/23/14  9:20 PM      Result Value Ref Range   Glucose-Capillary 241 (*) 70 - 99 mg/dL  GLUCOSE, CAPILLARY     Status: Abnormal   Collection Time    01/24/14  7:34 AM      Result Value Ref Range   Glucose-Capillary 206 (*) 70 - 99 mg/dL   Comment 1 Notify RN    GLUCOSE, CAPILLARY     Status: Abnormal   Collection Time    01/24/14 12:10 PM      Result Value Ref Range   Glucose-Capillary 337 (*) 70 - 99 mg/dL   Comment 1 Notify RN    GLUCOSE, CAPILLARY     Status: Abnormal   Collection Time    01/24/14  4:24 PM      Result Value Ref Range   Glucose-Capillary 222 (*) 70 - 99 mg/dL  GLUCOSE, CAPILLARY     Status: Abnormal   Collection Time    01/24/14  8:45 PM      Result Value Ref Range   Glucose-Capillary 178 (*) 70 - 99 mg/dL  GLUCOSE, CAPILLARY     Status: Abnormal   Collection Time    01/25/14  7:27 AM      Result Value Ref Range   Glucose-Capillary 250 (*) 70 - 99 mg/dL   Comment 1 Notify RN  GLUCOSE, CAPILLARY     Status: Abnormal   Collection Time    01/25/14 11:33 AM      Result Value Ref Range   Glucose-Capillary 235 (*) 70 - 99 mg/dL   Comment 1 Notify RN    GLUCOSE, CAPILLARY     Status: Abnormal   Collection Time    01/25/14  4:20 PM      Result Value Ref Range   Glucose-Capillary 259 (*) 70 - 99 mg/dL   Comment 1 Notify RN    GLUCOSE, CAPILLARY     Status: Abnormal   Collection Time    01/25/14  8:49 PM      Result Value Ref Range   Glucose-Capillary 290 (*) 70 - 99 mg/dL   Comment 1 Notify RN       HEENT: white coating on tongue Cardio: RRR and no murmur Resp: CTA B/L and unlabored GI: BS positive and non distended Extremity:  Pulses positive and No Edema Skin:   Intact Neuro: Alert/Oriented, Abnormal Sensory absent to LT LUE and intact LLE, Abnormal Motor 0/5 in LUE and LLE and Other no  evidence of L field cut Musc/Skel:  Other back pain  Gen NAD   Assessment/Plan: 1. Functional deficits secondary to Cardioembolic R MCA infarct which require 3+ hours per day of interdisciplinary therapy in a comprehensive inpatient rehab setting. Physiatrist is providing close team supervision and 24 hour management of active medical problems listed below. Physiatrist and rehab team continue to assess barriers to discharge/monitor patient progress toward functional and medical goals. FIM: FIM - Bathing Bathing Steps Patient Completed: Chest;Left Arm;Abdomen;Right upper leg;Left upper leg;Right lower leg (including foot) Bathing: 3: Mod-Patient completes 5-7 43f 10 parts or 50-74%  FIM - Upper Body Dressing/Undressing Upper body dressing/undressing steps patient completed: Thread/unthread right sleeve of pullover shirt/dresss;Put head through opening of pull over shirt/dress;Pull shirt over trunk Upper body dressing/undressing: 4: Min-Patient completed 75 plus % of tasks FIM - Lower Body Dressing/Undressing Lower body dressing/undressing steps patient completed: Thread/unthread right underwear leg;Thread/unthread right pants leg;Don/Doff right sock;Don/Doff right shoe Lower body dressing/undressing: 2: Max-Patient completed 25-49% of tasks  FIM - Toileting Toileting steps completed by patient:  (was wearing hospital gown ) Toileting Assistive Devices: Grab bar or rail for support Toileting: 1: Total-Patient completed zero steps, helper did all 3  FIM - Diplomatic Services operational officer Devices: Grab bars;Bedside commode Toilet Transfers: 1-To toilet/BSC: Total A (helper does all/Pt. < 25%);2-From toilet/BSC: Max A (lift and lower assist);1-Two helpers  FIM - Banker Devices: Arm rests Bed/Chair Transfer: 3: Sit > Supine: Mod A (lifting assist/Pt. 50-74%/lift 2 legs);3: Chair or W/C > Bed: Mod A (lift or lower assist)  FIM -  Locomotion: Wheelchair Distance: 150 Locomotion: Wheelchair: 5: Travels 150 ft or more: maneuvers on rugs and over door sills with supervision, cueing or coaxing FIM - Locomotion: Ambulation Locomotion: Ambulation Assistive Devices: Other (comment) (none) Ambulation/Gait Assistance: 1: +2 Total assist (3 musketeers) Locomotion: Ambulation: 1: Two helpers  Comprehension Comprehension Mode: Auditory Comprehension: 5-Follows basic conversation/direction: With no assist  Expression Expression Mode: Verbal Expression: 5-Expresses complex 90% of the time/cues < 10% of the time  Social Interaction Social Interaction: 6-Interacts appropriately with others with medication or extra time (anti-anxiety, antidepressant).  Problem Solving Problem Solving: 5-Solves basic problems: With no assist  Memory Memory: 6-More than reasonable amt of time  Medical Problem List and Plan:  1. Cardioembolic right MCA infarct, left hemiparesis and LUE sensory def  2. DVT Prophylaxis/Anticoagulation: Subcutaneous heparin. Monitor platelet counts and any signs of bleeding  3. Pain Management/chronic pain syndrome: Tegretol 200 mg twice a day for neuropathic pain. Patient also on Flexeril prior to admission but held due to some lethargy.  4. Neuropsych: This patient is capable of making decisions on her own behalf.  5. Nonischemic cardiomyopathy, CAD with pacemaker, placed Sept 2014. No chest pain or shortness of breath  6. Diabetes mellitus with peripheral neuropathy. UncontrolledHemoglobin A1c 10.7.Increase Lantus insulin 25 units each bedtime.titrate up Check blood sugars a.c. and at bedtime  7. Hypertension. Toprol-XL 25 mg twice a day, Lanoxin 0.125 mg daily. Monitor with increased activity.8. Chronic diastolic heart failure. Monitor for any signs of fluid overload. Daily i's and o's and weights. Patient on Lasix 40 mg twice a day prior to admission. Consider resuming back as needed monitor fluid intake 9.  Hyperlipidemia. Lipitor 10.  Spasticity increase zanaflex at noc helpful 11.  Urinary retention- started urecholine, voids small amts, add flomax , monitor BP  LOS (Days) 11 A FACE TO FACE EVALUATION WAS PERFORMED  KIRSTEINS,ANDREW E 01/26/2014, 7:23 AM

## 2014-01-27 LAB — GLUCOSE, CAPILLARY
GLUCOSE-CAPILLARY: 218 mg/dL — AB (ref 70–99)
GLUCOSE-CAPILLARY: 284 mg/dL — AB (ref 70–99)
Glucose-Capillary: 204 mg/dL — ABNORMAL HIGH (ref 70–99)
Glucose-Capillary: 291 mg/dL — ABNORMAL HIGH (ref 70–99)

## 2014-01-27 NOTE — Progress Notes (Signed)
Subjective/Complaints: 48 y.o. right-handed female with history of uncontrolled hypertension, diabetes mellitus peripheral neuropathy, morbid obesity, nonischemic cardiomyopathy, chronic diastolic heart failure, CAD status post pacemaker placement and chronic back pain followed at outpatient rehabilitation services. Admitted 01/09/2014 with left facial weakness and slurred speech. Patient with previous admission 10/07/2013 with similar symptoms workup negative as per neurology services. Cranial CT scan consistent with possible right middle cerebral artery territory infarct. MRI not completed secondary to pacemaker. Patient did not receive TPA. CTA view of the head negative for hemorrhage did show occluded branch MCA vessel. CTA of the neck was negative.. Echocardiogram with ejection fraction of 40% no wall motion abnormalities.. TEE showed moderately depressed LVEF without thrombus or vegetation. . Currently maintained on aspirin for CVA prophylaxis. Subcutaneous heparin for DVT prophylaxis. Diet has been advanced to a regular consistency  Good appetite, having BMs, voiding!  Review of Systems - Negative except back pain Objective: Vital Signs: Blood pressure 124/83, pulse 75, temperature 98 F (36.7 C), temperature source Oral, resp. rate 18, height 5\' 9"  (1.753 m), weight 121.1 kg (266 lb 15.6 oz), last menstrual period 12/21/2013, SpO2 98.00%. No results found. Results for orders placed during the hospital encounter of 01/15/14 (from the past 72 hour(s))  GLUCOSE, CAPILLARY     Status: Abnormal   Collection Time    01/24/14 12:10 PM      Result Value Ref Range   Glucose-Capillary 337 (*) 70 - 99 mg/dL   Comment 1 Notify RN    GLUCOSE, CAPILLARY     Status: Abnormal   Collection Time    01/24/14  4:24 PM      Result Value Ref Range   Glucose-Capillary 222 (*) 70 - 99 mg/dL  GLUCOSE, CAPILLARY     Status: Abnormal   Collection Time    01/24/14  8:45 PM      Result Value Ref Range    Glucose-Capillary 178 (*) 70 - 99 mg/dL  GLUCOSE, CAPILLARY     Status: Abnormal   Collection Time    01/25/14  7:27 AM      Result Value Ref Range   Glucose-Capillary 250 (*) 70 - 99 mg/dL   Comment 1 Notify RN    GLUCOSE, CAPILLARY     Status: Abnormal   Collection Time    01/25/14 11:33 AM      Result Value Ref Range   Glucose-Capillary 235 (*) 70 - 99 mg/dL   Comment 1 Notify RN    GLUCOSE, CAPILLARY     Status: Abnormal   Collection Time    01/25/14  4:20 PM      Result Value Ref Range   Glucose-Capillary 259 (*) 70 - 99 mg/dL   Comment 1 Notify RN    GLUCOSE, CAPILLARY     Status: Abnormal   Collection Time    01/25/14  8:49 PM      Result Value Ref Range   Glucose-Capillary 290 (*) 70 - 99 mg/dL   Comment 1 Notify RN    GLUCOSE, CAPILLARY     Status: Abnormal   Collection Time    01/26/14  7:10 AM      Result Value Ref Range   Glucose-Capillary 256 (*) 70 - 99 mg/dL   Comment 1 Notify RN    GLUCOSE, CAPILLARY     Status: Abnormal   Collection Time    01/26/14 11:19 AM      Result Value Ref Range   Glucose-Capillary 297 (*) 70 - 99 mg/dL  Comment 1 Notify RN    GLUCOSE, CAPILLARY     Status: Abnormal   Collection Time    01/26/14  4:24 PM      Result Value Ref Range   Glucose-Capillary 293 (*) 70 - 99 mg/dL   Comment 1 Notify RN    GLUCOSE, CAPILLARY     Status: Abnormal   Collection Time    01/26/14  8:40 PM      Result Value Ref Range   Glucose-Capillary 253 (*) 70 - 99 mg/dL  GLUCOSE, CAPILLARY     Status: Abnormal   Collection Time    01/27/14  7:04 AM      Result Value Ref Range   Glucose-Capillary 204 (*) 70 - 99 mg/dL   Comment 1 Notify RN       HEENT: normal tongue Cardio: RRR and no murmur Resp: CTA B/L and unlabored GI: BS positive and non distended Extremity:  Pulses positive and No Edema Skin:   Intact Neuro: Alert/Oriented, Abnormal Sensory absent to LT LUE and intact LLE, Abnormal Motor 0/5 in LUE and LLE and Other no evidence of L  field cut Musc/Skel:  Other back pain  Gen NAD   Assessment/Plan: 1. Functional deficits secondary to Cardioembolic R MCA infarct which require 3+ hours per day of interdisciplinary therapy in a comprehensive inpatient rehab setting. Physiatrist is providing close team supervision and 24 hour management of active medical problems listed below. Physiatrist and rehab team continue to assess barriers to discharge/monitor patient progress toward functional and medical goals. FIM: FIM - Bathing Bathing Steps Patient Completed: Chest;Left Arm;Abdomen;Right upper leg;Left upper leg;Right lower leg (including foot) Bathing: 3: Mod-Patient completes 5-7 41f 10 parts or 50-74%  FIM - Upper Body Dressing/Undressing Upper body dressing/undressing steps patient completed: Thread/unthread right sleeve of pullover shirt/dresss;Put head through opening of pull over shirt/dress;Pull shirt over trunk Upper body dressing/undressing: 4: Min-Patient completed 75 plus % of tasks FIM - Lower Body Dressing/Undressing Lower body dressing/undressing steps patient completed: Thread/unthread right underwear leg;Thread/unthread right pants leg;Don/Doff right sock;Don/Doff right shoe Lower body dressing/undressing: 2: Max-Patient completed 25-49% of tasks  FIM - Toileting Toileting steps completed by patient:  (was wearing hospital gown ) Toileting Assistive Devices: Grab bar or rail for support Toileting: 1: Total-Patient completed zero steps, helper did all 3  FIM - Diplomatic Services operational officer Devices: Grab bars;Bedside commode Toilet Transfers: 1-To toilet/BSC: Total A (helper does all/Pt. < 25%);2-From toilet/BSC: Max A (lift and lower assist);1-Two helpers  FIM - Banker Devices: Arm rests Bed/Chair Transfer: 3: Sit > Supine: Mod A (lifting assist/Pt. 50-74%/lift 2 legs);3: Chair or W/C > Bed: Mod A (lift or lower assist)  FIM - Locomotion:  Wheelchair Distance: 150 Locomotion: Wheelchair: 5: Travels 150 ft or more: maneuvers on rugs and over door sills with supervision, cueing or coaxing FIM - Locomotion: Ambulation Locomotion: Ambulation Assistive Devices: Other (comment) (none) Ambulation/Gait Assistance: 1: +2 Total assist (3 musketeers) Locomotion: Ambulation: 1: Two helpers  Comprehension Comprehension Mode: Auditory Comprehension: 5-Follows basic conversation/direction: With no assist  Expression Expression Mode: Verbal Expression: 5-Expresses complex 90% of the time/cues < 10% of the time  Social Interaction Social Interaction: 6-Interacts appropriately with others with medication or extra time (anti-anxiety, antidepressant).  Problem Solving Problem Solving: 5-Solves basic problems: With no assist  Memory Memory: 6-More than reasonable amt of time  Medical Problem List and Plan:  1. Cardioembolic right MCA infarct, left hemiparesis and LUE sensory def  2.  DVT Prophylaxis/Anticoagulation: Subcutaneous heparin. Monitor platelet counts and any signs of bleeding  3. Pain Management/chronic pain syndrome: Tegretol 200 mg twice a day for neuropathic pain. Patient also on Flexeril prior to admission but held due to some lethargy.  4. Neuropsych: This patient is capable of making decisions on her own behalf.  5. Nonischemic cardiomyopathy, CAD with pacemaker, placed Sept 2014. No chest pain or shortness of breath  6. Diabetes mellitus with peripheral neuropathy. UncontrolledHemoglobin A1c 10.7.Increase Lantus insulin 30 units each bedtime.titrate up Check blood sugars a.c. and at bedtime  7. Hypertension. Toprol-XL 25 mg twice a day, Lanoxin 0.125 mg daily. Monitor with increased activity.8. Chronic diastolic heart failure. Monitor for any signs of fluid overload. Daily i's and o's and weights. Patient on Lasix 40 mg twice a day prior to admission. Consider resuming back as needed monitor fluid intake 9. Hyperlipidemia.  Lipitor 10.  Spasticity increase zanaflex at noc helpful 11.  Urinary retention- started urecholine, and  flomax , monitor BP  LOS (Days) 12 A FACE TO FACE EVALUATION WAS PERFORMED  Ikenna Ohms E 01/27/2014, 9:09 AM

## 2014-01-27 NOTE — Progress Notes (Signed)
Wearing O2 at 2L/m via [place] at HS, instead of CPAP. Getting up to [facility] to void with 2 person max assist transfer. [name]

## 2014-01-28 ENCOUNTER — Inpatient Hospital Stay (HOSPITAL_COMMUNITY): Payer: Medicaid Other | Admitting: Speech Pathology

## 2014-01-28 ENCOUNTER — Inpatient Hospital Stay (HOSPITAL_COMMUNITY): Payer: Medicaid Other

## 2014-01-28 ENCOUNTER — Inpatient Hospital Stay (HOSPITAL_COMMUNITY): Payer: Medicaid Other | Admitting: Physical Therapy

## 2014-01-28 LAB — GLUCOSE, CAPILLARY
GLUCOSE-CAPILLARY: 204 mg/dL — AB (ref 70–99)
Glucose-Capillary: 248 mg/dL — ABNORMAL HIGH (ref 70–99)
Glucose-Capillary: 287 mg/dL — ABNORMAL HIGH (ref 70–99)
Glucose-Capillary: 227 mg/dL — ABNORMAL HIGH (ref 70–99)

## 2014-01-28 MED ORDER — BETHANECHOL CHLORIDE 10 MG PO TABS
10.0000 mg | ORAL_TABLET | Freq: Three times a day (TID) | ORAL | Status: DC
  Administered 2014-01-28 – 2014-01-29 (×4): 10 mg via ORAL
  Filled 2014-01-28 (×11): qty 1

## 2014-01-28 MED ORDER — SENNOSIDES-DOCUSATE SODIUM 8.6-50 MG PO TABS
2.0000 | ORAL_TABLET | Freq: Two times a day (BID) | ORAL | Status: DC
  Administered 2014-01-28 – 2014-02-05 (×16): 2 via ORAL
  Filled 2014-01-28 (×16): qty 2

## 2014-01-28 NOTE — Progress Notes (Addendum)
Subjective/Complaints: 48 y.o. right-handed female with history of uncontrolled hypertension, diabetes mellitus peripheral neuropathy, morbid obesity, nonischemic cardiomyopathy, chronic diastolic heart failure, CAD status post pacemaker placement and chronic back pain followed at outpatient rehabilitation services. Admitted 01/09/2014 with left facial weakness and slurred speech. Patient with previous admission 10/07/2013 with similar symptoms workup negative as per neurology services. Cranial CT scan consistent with possible right middle cerebral artery territory infarct. MRI not completed secondary to pacemaker. Patient did not receive TPA. CTA view of the head negative for hemorrhage did show occluded branch MCA vessel. CTA of the neck was negative.. Echocardiogram with ejection fraction of 40% no wall motion abnormalities.. TEE showed moderately depressed LVEF without thrombus or vegetation. . Currently maintained on aspirin for CVA prophylaxis. Subcutaneous heparin for DVT prophylaxis. Diet has been advanced to a regular consistency  No BM for a couple days, still voiding but has increased freq  Review of Systems - Negative except back pain Objective: Vital Signs: Blood pressure 127/84, pulse 77, temperature 97.4 F (36.3 C), temperature source Oral, resp. rate 16, height 5\' 9"  (1.753 m), weight 121.1 kg (266 lb 15.6 oz), last menstrual period 12/21/2013, SpO2 100.00%. No results found. Results for orders placed during the hospital encounter of 01/15/14 (from the past 72 hour(s))  GLUCOSE, CAPILLARY     Status: Abnormal   Collection Time    01/25/14 11:33 AM      Result Value Ref Range   Glucose-Capillary 235 (*) 70 - 99 mg/dL   Comment 1 Notify RN    GLUCOSE, CAPILLARY     Status: Abnormal   Collection Time    01/25/14  4:20 PM      Result Value Ref Range   Glucose-Capillary 259 (*) 70 - 99 mg/dL   Comment 1 Notify RN    GLUCOSE, CAPILLARY     Status: Abnormal   Collection Time   01/25/14  8:49 PM      Result Value Ref Range   Glucose-Capillary 290 (*) 70 - 99 mg/dL   Comment 1 Notify RN    GLUCOSE, CAPILLARY     Status: Abnormal   Collection Time    01/26/14  7:10 AM      Result Value Ref Range   Glucose-Capillary 256 (*) 70 - 99 mg/dL   Comment 1 Notify RN    GLUCOSE, CAPILLARY     Status: Abnormal   Collection Time    01/26/14 11:19 AM      Result Value Ref Range   Glucose-Capillary 297 (*) 70 - 99 mg/dL   Comment 1 Notify RN    GLUCOSE, CAPILLARY     Status: Abnormal   Collection Time    01/26/14  4:24 PM      Result Value Ref Range   Glucose-Capillary 293 (*) 70 - 99 mg/dL   Comment 1 Notify RN    GLUCOSE, CAPILLARY     Status: Abnormal   Collection Time    01/26/14  8:40 PM      Result Value Ref Range   Glucose-Capillary 253 (*) 70 - 99 mg/dL  GLUCOSE, CAPILLARY     Status: Abnormal   Collection Time    01/27/14  7:04 AM      Result Value Ref Range   Glucose-Capillary 204 (*) 70 - 99 mg/dL   Comment 1 Notify RN    GLUCOSE, CAPILLARY     Status: Abnormal   Collection Time    01/27/14 11:27 AM  Result Value Ref Range   Glucose-Capillary 218 (*) 70 - 99 mg/dL   Comment 1 Notify RN    GLUCOSE, CAPILLARY     Status: Abnormal   Collection Time    01/27/14  4:26 PM      Result Value Ref Range   Glucose-Capillary 284 (*) 70 - 99 mg/dL   Comment 1 Notify RN    GLUCOSE, CAPILLARY     Status: Abnormal   Collection Time    01/27/14  8:42 PM      Result Value Ref Range   Glucose-Capillary 291 (*) 70 - 99 mg/dL  GLUCOSE, CAPILLARY     Status: Abnormal   Collection Time    01/28/14  7:21 AM      Result Value Ref Range   Glucose-Capillary 248 (*) 70 - 99 mg/dL   Comment 1 Notify RN       HEENT: normal tongue Cardio: RRR and no murmur Resp: CTA B/L and unlabored GI: BS positive and non distended Extremity:  Pulses positive and No Edema Skin:   Intact Neuro: Alert/Oriented, Abnormal Sensory absent to LT LUE and intact LLE, Abnormal  Motor 0/5 in LUE and LLE and Other no evidence of L field cut Musc/Skel:  Other back pain  Gen NAD   Assessment/Plan: 1. Functional deficits secondary to Cardioembolic R MCA infarct which require 3+ hours per day of interdisciplinary therapy in a comprehensive inpatient rehab setting. Physiatrist is providing close team supervision and 24 hour management of active medical problems listed below. Physiatrist and rehab team continue to assess barriers to discharge/monitor patient progress toward functional and medical goals. FIM: FIM - Bathing Bathing Steps Patient Completed: Chest;Left Arm;Abdomen;Right upper leg;Left upper leg;Right lower leg (including foot) Bathing: 3: Mod-Patient completes 5-7 47f 10 parts or 50-74%  FIM - Upper Body Dressing/Undressing Upper body dressing/undressing steps patient completed: Thread/unthread right sleeve of pullover shirt/dresss;Put head through opening of pull over shirt/dress;Pull shirt over trunk Upper body dressing/undressing: 4: Min-Patient completed 75 plus % of tasks FIM - Lower Body Dressing/Undressing Lower body dressing/undressing steps patient completed: Thread/unthread right underwear leg;Thread/unthread right pants leg;Don/Doff right sock;Don/Doff right shoe Lower body dressing/undressing: 2: Max-Patient completed 25-49% of tasks  FIM - Toileting Toileting steps completed by patient:  (was wearing hospital gown ) Toileting Assistive Devices: Grab bar or rail for support Toileting: 1: Total-Patient completed zero steps, helper did all 3  FIM - Diplomatic Services operational officer Devices: Grab bars;Bedside commode Toilet Transfers: 1-To toilet/BSC: Total A (helper does all/Pt. < 25%);2-From toilet/BSC: Max A (lift and lower assist);1-Two helpers  FIM - Banker Devices: Arm rests Bed/Chair Transfer: 3: Sit > Supine: Mod A (lifting assist/Pt. 50-74%/lift 2 legs);3: Chair or W/C > Bed: Mod A  (lift or lower assist)  FIM - Locomotion: Wheelchair Distance: 150 Locomotion: Wheelchair: 5: Travels 150 ft or more: maneuvers on rugs and over door sills with supervision, cueing or coaxing FIM - Locomotion: Ambulation Locomotion: Ambulation Assistive Devices: Other (comment) (none) Ambulation/Gait Assistance: 1: +2 Total assist (3 musketeers) Locomotion: Ambulation: 1: Two helpers  Comprehension Comprehension Mode: Auditory Comprehension: 5-Follows basic conversation/direction: With no assist  Expression Expression Mode: Verbal Expression: 5-Expresses complex 90% of the time/cues < 10% of the time  Social Interaction Social Interaction: 6-Interacts appropriately with others with medication or extra time (anti-anxiety, antidepressant).  Problem Solving Problem Solving: 5-Solves basic problems: With no assist  Memory Memory: 6-More than reasonable amt of time  Medical Problem List and  Plan:  1. Cardioembolic right MCA infarct, left hemiparesis and LUE sensory def  2. DVT Prophylaxis/Anticoagulation: Subcutaneous heparin. Monitor platelet counts and any signs of bleeding  3. Pain Management/chronic pain syndrome: Tegretol 200 mg twice a day for neuropathic pain. Patient also on Flexeril prior to admission but held due to some lethargy.  4. Neuropsych: This patient is capable of making decisions on her own behalf.  5. Nonischemic cardiomyopathy, CAD with pacemaker, placed Sept 2014. No chest pain or shortness of breath  6. Diabetes mellitus with peripheral neuropathy. UncontrolledHemoglobin A1c 10.7.Increase Lantus insulin 35 units each bedtime.titrate up Check blood sugars a.c. and at bedtime  7. Hypertension. Toprol-XL 25 mg twice a day, Lanoxin 0.125 mg daily. Monitor with increased activity.8. Chronic diastolic heart failure. Monitor for any signs of fluid overload. Daily i's and o's and weights. Patient on Lasix 40 mg twice a day prior to admission. Consider resuming back as  needed monitor fluid intake 9. Hyperlipidemia. Lipitor 10.  Spasticity increase zanaflex at noc helpful 11.  Urinary retention- improving on  Urecholine (will reduce dose secondary to freq), and  Flomax, also check UA secondary to freq and hx of recurrent UTI  LOS (Days) 13 A FACE TO FACE EVALUATION WAS PERFORMED  KIRSTEINS,ANDREW E 01/28/2014, 7:45 AM

## 2014-01-28 NOTE — Progress Notes (Signed)
Urine cloudy and malodorous. Eats food brought in from family. Patient reports daughter able to check blood sugars and administer insulin. [name] A

## 2014-01-28 NOTE — Progress Notes (Signed)
Speech Language Pathology Daily Session Note  Patient Details  Name: [name] MRN: [id number] Date of Birth: August 14, 1966  Today's Date: 01/28/2014 Time: 7342-8768 Time Calculation (min): 45 min  Short Term Goals: Week 2: SLP Short Term Goal 1 (Week 2): Pt will demonstrate efficient mastication and oral clearance with trials of regular textures with Mod I SLP Short Term Goal 2 (Week 2): Pt will demonstrate functional problem solving for complex tasks with supervision verbal cues.  SLP Short Term Goal 3 (Week 2): Pt will utilize speech intelligibility strategies at the sentence level with Mod I.  SLP Short Term Goal 4 (Week 2): Pt will perfrom lingual and labial oral-motor exercises with Min A verbal and visual cues.  SLP Short Term Goal 5 (Week 2): Pt will attend to left field of enviornment/body during functional tasks with mod I.   Skilled Therapeutic Interventions: Skilled treatment session focused on addressing dysphagia and cognitive goals. SLP facilitated session with advanced trials of regular textures (pineapple fruit cup); patient required set-up assist and Min physical assist to stabilize cup while she scooped.  Patient demonstrated adequate mastication with little to no oral resides and no overt s/s of aspiration were observed.  Patient verbalized adequate anticipatory awareness by identifying tasks that will require her family's assist to complete following discharge due to CVA deficits.  Continue with current plan of care.   FIM:  Comprehension Comprehension Mode: Auditory Comprehension: 5-Follows basic conversation/direction: With no assist Expression Expression Mode: Verbal Expression: 5-Expresses complex 90% of the time/cues < 10% of the time Social Interaction Social Interaction: 6-Interacts appropriately with others with medication or extra time (anti-anxiety, antidepressant). Problem Solving Problem Solving: 5-Solves basic problems: With no assist Memory Memory:  6-More than reasonable amt of time FIM - Eating Eating Activity: 5: Set-up assist for open containers;5: Supervision/cues  Pain Pain Assessment Pain Assessment: No/denies pain  Therapy/Group: Individual Therapy  Charlane Ferretti., CCC-SLP [phone]  [name] 01/28/2014, 3:32 PM

## 2014-01-28 NOTE — Progress Notes (Signed)
Physical Therapy Session Note  Patient Details  Name: [name] MRN: [id number] Date of Birth: 1966/08/05  Today's Date: 01/28/2014 Time: 1330-1440 Time Calculation (min): 70 min  Short Term Goals: Week 2:  PT Short Term Goal 1 (Week 2): Pt will consistently perform sit<>supine with mod A using bed rail with HOB flat. PT Short Term Goal 2 (Week 2): Pt will consistently perform  bed<>chair transfer with mod A. PT Short Term Goal 3 (Week 2): Pt will perform w/c mobility in controlled environment x150' with supervision and no rest breaks. PT Short Term Goal 4 (Week 2): Pt will perform static standing with single UE support x1 minute with min A. PT Short Term Goal 5 (Week 2): Pt will perform gait x10' in controlled environment with total A of single therapist.  Skilled Therapeutic Interventions/Progress Updates:    2:1; co-treatment with rec therapist. Pt received semi-reclined in bed; agreeable to therapy. Session focused on increasing stability/independence with bed mobility, functional transfers, and gait. Pt performed supine>sit with supervision, HOB flat, bed rail, and mod multimodal cueing for technique, sequencing. Scoot pivot transfer to L side with mod A, manual stabilization of L knee; Bobath technique used to facilitate anterior weight shift. Pt performed w/c mobility x50', x40' in controlled environment using R hemi technique, supervision.   Gait x28' in controlled environment with R hallway hand rail and +1Total A. Ace bandage applied to LLE to facilitate L ankle dorsiflexion, prevent L genu recurvatum. PT positioned under pt's LUE providing manual facilitation of the following: R weight shift, LLE advancement, L knee stabilization (focus on prevention of hyperextension). Tactile cueing at L gluteus maximus to promote L hip extension.   Following gait trial, pt reports feeling as though w/c cushion is wet secondary to episode of urinary incontinence earlier this am. Pt's pants  noted to be wet secondary to soiled w/c cushion. Therapists transported pt to room, where pt performed squat pivot transfer from w/c>bed with mod A, manual stabilization as described above. Supine>sit performed x2, initially with mod A for bilat LE management, then with min A for LLE management. During hygiene and LB dressing, pt performed rolling with min-mod A to R, supervision/cueing to L. Scooting to [facility] [place] with min A, manual facilitation of LLE weight bearing. During bed mobility, pt continent of bladder using bed pan. RN notified. Therapists departed with pt semi-reclined in bed with 3 bed rails up, bed alarm on, and all needs within reach. W/c cushion replaced.   Therapy Documentation Precautions:  Precautions Precautions: Fall;ICD/Pacemaker Precaution Comments: pacemaker; chronic back pain Restrictions Weight Bearing Restrictions: No Vital Signs: Therapy Vitals Temp: 97.5 F (36.4 C) Temp src: Oral Pulse Rate: 81 Resp: 18 BP: 159/83 mmHg Patient Position, if appropriate: Lying Oxygen Therapy SpO2: 98 % O2 Device: None (Room air) Pain: Pain Assessment Pain Assessment: No/denies pain Locomotion : Ambulation Ambulation/Gait Assistance: 1: +1 Total assist Wheelchair Mobility Distance: 50   See FIM for current functional status  Therapy/Group: Co-Treatment  Lennox Dolberry A 01/28/2014, 6:11 PM

## 2014-01-28 NOTE — Progress Notes (Signed)
Occupational Therapy Session Note  Patient Details  Name: [name] MRN: [id number] Date of Birth: Jun 07, 1966  Today's Date: 01/28/2014 Time: 5456-2563 Time Calculation (min): 87 min  Short Term Goals: Week 2:  OT Short Term Goal 1 (Week 2): Pt will complete tub transfer using TTB with max assist OT Short Term Goal 2 (Week 2): Pt will complete UB dressing with min assist  OT Short Term Goal 3 (Week 2): Pt will complete toilet task with max assist OT Short Term Goal 4 (Week 2): Pt will complete sit<>stand with mod assist for initiation and balance for 1 grooming task.   Skilled Therapeutic Interventions/Progress Updates:    Pt seen for ADL retraining with focus on functional transfers, sit<>stand, and hemi dressing technique. Pt received supine in bed eager for shower this AM. Completed squat pivot transfers bed>w/c and w/c<>TTB (walk-in shower) with mod assist using grab bar in shower for pulling up. Discussed installing grab bars at home in shower as well. Emphasis on pt directing care and requiring min cues for correct positioning of w/c prior to transfers initially. Pt recalled hemi dressing techniques and required min assist to complete them. Pt requires min assist for sit<>stand via pulling up on grab bars and mod-max assist for sit<>stand when pushing up from surface requiring min cueing for technique. Pt with min-mod assist standing balance during clothing management and pt assisting minimally for pulling up over R hip. Completed hair care while sitting in w/c at sink and therapist providing PROM and stretching to LUE. At end of session pt left sitting in w/c with all needs in reach and lap tray donned. RN present presenting meds.   Therapy Documentation Precautions:  Precautions Precautions: Fall;ICD/Pacemaker Precaution Comments: pacemaker; chronic back pain Restrictions Weight Bearing Restrictions: No General:   Vital Signs:   Pain: Pt reporting 8/10 pain in back and  headache towards end of session. RN notified and provided tylenol.   See FIM for current functional status  Therapy/Group: Individual Therapy  Daneil Dan 01/28/2014, 12:23 PM

## 2014-01-29 ENCOUNTER — Inpatient Hospital Stay (HOSPITAL_COMMUNITY): Payer: Medicaid Other

## 2014-01-29 ENCOUNTER — Inpatient Hospital Stay (HOSPITAL_COMMUNITY): Payer: Medicaid Other | Admitting: *Deleted

## 2014-01-29 LAB — GLUCOSE, CAPILLARY
GLUCOSE-CAPILLARY: 226 mg/dL — AB (ref 70–99)
GLUCOSE-CAPILLARY: 176 mg/dL — AB (ref 70–99)
Glucose-Capillary: 275 mg/dL — ABNORMAL HIGH (ref 70–99)

## 2014-01-29 LAB — URINALYSIS, ROUTINE W REFLEX MICROSCOPIC
BILIRUBIN URINE: NEGATIVE
Glucose, UA: >1000 mg/dL — AB
HGB URINE DIPSTICK: NEGATIVE
Ketones, ur: NEGATIVE mg/dL
Nitrite: NEGATIVE
PH: 5 (ref 5.0–8.0)
Protein, ur: NEGATIVE mg/dL
Specific Gravity, Urine: 1.03 (ref 1.005–1.030)
Urobilinogen, UA: 0.2 mg/dL (ref 0.0–1.0)

## 2014-01-29 LAB — URINE MICROSCOPIC-ADD ON

## 2014-01-29 MED ORDER — CEPHALEXIN 250 MG PO CAPS
250.0000 mg | ORAL_CAPSULE | Freq: Three times a day (TID) | ORAL | Status: DC
  Administered 2014-01-29 – 2014-02-05 (×21): 250 mg via ORAL
  Filled 2014-01-29 (×24): qty 1

## 2014-01-29 MED ORDER — BETHANECHOL CHLORIDE 25 MG PO TABS
25.0000 mg | ORAL_TABLET | Freq: Three times a day (TID) | ORAL | Status: DC
  Administered 2014-01-29 – 2014-01-30 (×5): 25 mg via ORAL
  Filled 2014-01-29 (×9): qty 1

## 2014-01-29 NOTE — Progress Notes (Signed)
Physical Therapy Session Note  Patient Details  Name: [name] MRN: [id number] Date of Birth: 1966/05/22  Today's Date: 01/29/2014 Time: 1130-1200 (Co-tx with KNP) and 4401-0272 Time Calculation (min): 30 min (entire session from 1100-1200) and 30 min  Short Term Goals: Week 2:  PT Short Term Goal 1 (Week 2): Pt will consistently perform sit<>supine with mod A using bed rail with HOB flat. PT Short Term Goal 2 (Week 2): Pt will consistently perform  bed<>chair transfer with mod A. PT Short Term Goal 3 (Week 2): Pt will perform w/c mobility in controlled environment x150' with supervision and no rest breaks. PT Short Term Goal 4 (Week 2): Pt will perform static standing with single UE support x1 minute with min A. PT Short Term Goal 5 (Week 2): Pt will perform gait x10' in controlled environment with total A of single therapist.  Skilled Therapeutic Interventions/Progress Updates:    Treatment Session 1: Skilled co-treatment with primary OT (KNP). Pt received seated in w/c; agreeable to therapy. Session focused on increasing pt stability/independnece with tub transfer and gait. See primary OT's note for detailed description of tub transfers.   Pt performed w/c mobility x120' in controlled and home environment with supervision. Ace bandage applied to LLE to facilitate L ankle dorsiflexion, prevent L genu recurvatum. Gait x30' in controlled environment with R hallway hand rail and +1Total A. PT positioned under pt's LUE providing manual facilitation of the following: R weight shift, LLE advancement, L knee stabilization (focus on prevention of hyperextension) before/during RLE swing phase. After seated rest break, pt performed gait x10' in controlled environment with rolling walker, L hand orthosis. Pt performed >50% of gait but required +2A for the following: manual stabilization of LUE at L hand orthosis, advancement of rolling walker, bilat weight shifting, LLE advancement/placement. Manual  stabilization of L knee not required during second gait trial. Active initiation of LLE advancement (x2) appears to be due to anterior pelvic rotation, activation of L hip adductors vs hip flexors.    Transported pt to room in w/c secondary to pt fatigue. Pt performed scoot pivot from w/c>bed (to R side) with mod A , manual stabilization of L knee; utilized Bobath method to promote anterior weight shifting. Attempted to use leg lifter for LLE management with sit>supine transfer; however, pt continues to require mod A for bilat LE management. Therapists departed with pt semi-reclined in bed with 3 bed rails up, bed alarm on, all needs within reach, and CNA present.  Treatment Session 2: Co-treatment with rec therapist. Pt received seated in w/c; agreeable to therapy. Session focused on w/c mobility/management in community environment. Pt transported in w/c to hospital solarium, where pt performed w/c mobility x170' in community environment (carpeted, tile surfaces) with supervision using R hemi technique. Pt able to manage R w/c brake with mod I. Requires total A to manage L w/c brake secondary to difficulty reaching brake. After returning to rehab unit, pt performed w/c mobility x150' in controlled environment with supervision and R hemi technique. Session ended at pt room, where speech therapist received pt in w/c for speech therapy session.   Therapy Documentation Precautions:  Precautions Precautions: Fall;ICD/Pacemaker Precaution Comments: pacemaker; chronic back pain Restrictions Weight Bearing Restrictions: No Pain: Pain Assessment Pain Assessment: No/denies pain Pain Score: 0-No pain Locomotion : Ambulation Ambulation/Gait Assistance: 1: +2 Total assist Wheelchair Mobility Distance: 120   See FIM for current functional status  Therapy/Group: Co-Treatment  Macio Kissoon A 01/29/2014, 12:10 PM

## 2014-01-29 NOTE — Plan of Care (Signed)
Problem: RH BLADDER ELIMINATION Goal: RH STG MANAGE BLADDER WITH ASSISTANCE STG Manage Bladder With mod Assistance  Outcome: Progressing Urine has strong odor,ua to be collected w/ next void.

## 2014-01-29 NOTE — Progress Notes (Signed)
Subjective/Complaints: 48 y.o. right-handed female with history of uncontrolled hypertension, diabetes mellitus peripheral neuropathy, morbid obesity, nonischemic cardiomyopathy, chronic diastolic heart failure, CAD status post pacemaker placement and chronic back pain followed at outpatient rehabilitation services. Admitted 01/09/2014 with left facial weakness and slurred speech. Patient with previous admission 10/07/2013 with similar symptoms workup negative as per neurology services. Cranial CT scan consistent with possible right middle cerebral artery territory infarct. MRI not completed secondary to pacemaker. Patient did not receive TPA. CTA view of the head negative for hemorrhage did show occluded branch MCA vessel. CTA of the neck was negative.. Echocardiogram with ejection fraction of 40% no wall motion abnormalities.. TEE showed moderately depressed LVEF without thrombus or vegetation. . Currently maintained on aspirin for CVA prophylaxis. Subcutaneous heparin for DVT prophylaxis. Diet has been advanced to a regular consistency  No BM for a couple days, still voiding but has increased freq  Review of Systems - Negative except back pain Objective: Vital Signs: Blood pressure 151/92, pulse 96, temperature 98 F (36.7 C), temperature source Oral, resp. rate 18, height 5\' 9"  (1.753 m), weight 121.1 kg (266 lb 15.6 oz), last menstrual period 12/21/2013, SpO2 99.00%. No results found. Results for orders placed during the hospital encounter of 01/15/14 (from the past 72 hour(s))  GLUCOSE, CAPILLARY     Status: Abnormal   Collection Time    01/26/14 11:19 AM      Result Value Ref Range   Glucose-Capillary 297 (*) 70 - 99 mg/dL   Comment 1 Notify RN    GLUCOSE, CAPILLARY     Status: Abnormal   Collection Time    01/26/14  4:24 PM      Result Value Ref Range   Glucose-Capillary 293 (*) 70 - 99 mg/dL   Comment 1 Notify RN    GLUCOSE, CAPILLARY     Status: Abnormal   Collection Time     01/26/14  8:40 PM      Result Value Ref Range   Glucose-Capillary 253 (*) 70 - 99 mg/dL  GLUCOSE, CAPILLARY     Status: Abnormal   Collection Time    01/27/14  7:04 AM      Result Value Ref Range   Glucose-Capillary 204 (*) 70 - 99 mg/dL   Comment 1 Notify RN    GLUCOSE, CAPILLARY     Status: Abnormal   Collection Time    01/27/14 11:27 AM      Result Value Ref Range   Glucose-Capillary 218 (*) 70 - 99 mg/dL   Comment 1 Notify RN    GLUCOSE, CAPILLARY     Status: Abnormal   Collection Time    01/27/14  4:26 PM      Result Value Ref Range   Glucose-Capillary 284 (*) 70 - 99 mg/dL   Comment 1 Notify RN    GLUCOSE, CAPILLARY     Status: Abnormal   Collection Time    01/27/14  8:42 PM      Result Value Ref Range   Glucose-Capillary 291 (*) 70 - 99 mg/dL  GLUCOSE, CAPILLARY     Status: Abnormal   Collection Time    01/28/14  7:21 AM      Result Value Ref Range   Glucose-Capillary 248 (*) 70 - 99 mg/dL   Comment 1 Notify RN    GLUCOSE, CAPILLARY     Status: Abnormal   Collection Time    01/28/14 11:31 AM      Result Value Ref Range  Glucose-Capillary 287 (*) 70 - 99 mg/dL   Comment 1 Notify RN    GLUCOSE, CAPILLARY     Status: Abnormal   Collection Time    01/28/14  4:38 PM      Result Value Ref Range   Glucose-Capillary 204 (*) 70 - 99 mg/dL  GLUCOSE, CAPILLARY     Status: Abnormal   Collection Time    01/28/14  8:51 PM      Result Value Ref Range   Glucose-Capillary 227 (*) 70 - 99 mg/dL  URINALYSIS, ROUTINE W REFLEX MICROSCOPIC     Status: Abnormal   Collection Time    01/29/14  5:33 AM      Result Value Ref Range   Color, Urine YELLOW  YELLOW   APPearance CLOUDY (*) CLEAR   Specific Gravity, Urine 1.030  1.005 - 1.030   pH 5.0  5.0 - 8.0   Glucose, UA >1000 (*) NEGATIVE mg/dL   Hgb urine dipstick NEGATIVE  NEGATIVE   Bilirubin Urine NEGATIVE  NEGATIVE   Ketones, ur NEGATIVE  NEGATIVE mg/dL   Protein, ur NEGATIVE  NEGATIVE mg/dL   Urobilinogen, UA 0.2  0.0 -  1.0 mg/dL   Nitrite NEGATIVE  NEGATIVE   Leukocytes, UA SMALL (*) NEGATIVE  URINE MICROSCOPIC-ADD ON     Status: Abnormal   Collection Time    01/29/14  5:33 AM      Result Value Ref Range   Squamous Epithelial / LPF RARE  RARE   WBC, UA 11-20  <3 WBC/hpf   RBC / HPF 0-2  <3 RBC/hpf   Bacteria, UA MANY (*) RARE   Casts HYALINE CASTS (*) NEGATIVE  GLUCOSE, CAPILLARY     Status: Abnormal   Collection Time    01/29/14  7:22 AM      Result Value Ref Range   Glucose-Capillary 275 (*) 70 - 99 mg/dL   Comment 1 Notify RN       HEENT: normal tongue Cardio: RRR and no murmur Resp: CTA B/L and unlabored GI: BS positive and non distended Extremity:  Pulses positive and No Edema Skin:   Intact Neuro: Alert/Oriented, Abnormal Sensory absent to LT LUE and intact LLE, Abnormal Motor 0/5 in LUE and LLE and Other no evidence of L field cut Musc/Skel:  Other back pain  Gen NAD   Assessment/Plan: 1. Functional deficits secondary to Cardioembolic R MCA infarct which require 3+ hours per day of interdisciplinary therapy in a comprehensive inpatient rehab setting. Physiatrist is providing close team supervision and 24 hour management of active medical problems listed below. Physiatrist and rehab team continue to assess barriers to discharge/monitor patient progress toward functional and medical goals. FIM: FIM - Bathing Bathing Steps Patient Completed: Chest;Left Arm;Abdomen;Right upper leg;Left upper leg;Right lower leg (including foot);Front perineal area Bathing: 3: Mod-Patient completes 5-7 91f 10 parts or 50-74%  FIM - Upper Body Dressing/Undressing Upper body dressing/undressing steps patient completed: Thread/unthread right sleeve of pullover shirt/dresss;Put head through opening of pull over shirt/dress;Pull shirt over trunk;Thread/unthread right bra strap;Thread/unthread left bra strap Upper body dressing/undressing: 3: Mod-Patient completed 50-74% of tasks FIM - Lower Body  Dressing/Undressing Lower body dressing/undressing steps patient completed: Thread/unthread right underwear leg;Thread/unthread right pants leg;Don/Doff right sock;Don/Doff right shoe Lower body dressing/undressing: 2: Max-Patient completed 25-49% of tasks  FIM - Toileting Toileting steps completed by patient:  (was wearing hospital gown ) Toileting Assistive Devices: Grab bar or rail for support Toileting: 1: Total-Patient completed zero steps, helper did all 3  FIM - Diplomatic Services operational officer Devices: Grab bars;Bedside commode Toilet Transfers: 1-To toilet/BSC: Total A (helper does all/Pt. < 25%);2-From toilet/BSC: Max A (lift and lower assist);1-Two helpers  FIM - Banker Devices: Arm rests;Bed rails Bed/Chair Transfer: 3: Sit > Supine: Mod A (lifting assist/Pt. 50-74%/lift 2 legs);3: Chair or W/C > Bed: Mod A (lift or lower assist);3: Bed > Chair or W/C: Mod A (lift or lower assist);5: Supine > Sit: Supervision (verbal cues/safety issues)  FIM - Locomotion: Wheelchair Distance: 50 Locomotion: Wheelchair: 2: Travels 50 - 149 ft with supervision, cueing or coaxing FIM - Locomotion: Ambulation Locomotion: Ambulation Assistive Devices: Other (comment) (R hallway hand rail) Ambulation/Gait Assistance: 1: +1 Total assist Locomotion: Ambulation: 1: Travels less than 50 ft with total assistance/helper does all (Pt.<25%)  Comprehension Comprehension Mode: Auditory Comprehension: 5-Understands complex 90% of the time/Cues < 10% of the time  Expression Expression Mode: Verbal Expression: 5-Expresses complex 90% of the time/cues < 10% of the time  Social Interaction Social Interaction: 6-Interacts appropriately with others with medication or extra time (anti-anxiety, antidepressant).  Problem Solving Problem Solving: 5-Solves basic problems: With no assist  Memory Memory: 6-More than reasonable amt of time  Medical Problem  List and Plan:  1. Cardioembolic right MCA infarct, left hemiparesis and LUE sensory def  2. DVT Prophylaxis/Anticoagulation: Subcutaneous heparin. Monitor platelet counts and any signs of bleeding  3. Pain Management/chronic pain syndrome: Tegretol 200 mg twice a day for neuropathic pain. Patient also on Flexeril prior to admission but held due to some lethargy.  4. Neuropsych: This patient is capable of making decisions on her own behalf.  5. Nonischemic cardiomyopathy, CAD with pacemaker, placed Sept 2014. No chest pain or shortness of breath  6. Diabetes mellitus with peripheral neuropathy. UncontrolledHemoglobin A1c 10.7.Increase Lantus insulin 35 units each bedtime.titrate up Check blood sugars a.c. and at bedtime  7. Hypertension. Toprol-XL 25 mg twice a day, Lanoxin 0.125 mg daily. Monitor with increased activity.8. Chronic diastolic heart failure. Monitor for any signs of fluid overload. Daily i's and o's and weights. Patient on Lasix 40 mg twice a day prior to admission. Consider resuming back as needed monitor fluid intake 9. Hyperlipidemia. Lipitor 10.  Spasticity increase zanaflex at noc helpful 11.  Urinary retention- recurrent after reducing Urecholine, resume 25mg  dose , and  Flomax,UA +, add keflex  LOS (Days) 14 A FACE TO FACE EVALUATION WAS PERFORMED  01/29/2014, 9:39 AM   Subjective/Complaints: 48 y.o. right-handed female with history of uncontrolled hypertension, diabetes mellitus peripheral neuropathy, morbid obesity, nonischemic cardiomyopathy, chronic diastolic heart failure, CAD status post pacemaker placement and chronic back pain followed at outpatient rehabilitation services. Admitted 01/09/2014 with left facial weakness and slurred speech. Patient with previous admission 10/07/2013 with similar symptoms workup negative as per neurology services. Cranial CT scan consistent with possible right middle cerebral artery territory infarct. MRI not completed  secondary to pacemaker. Patient did not receive TPA. CTA view of the head negative for hemorrhage did show occluded branch MCA vessel. CTA of the neck was negative.. Echocardiogram with ejection fraction of 40% no wall motion abnormalities.. TEE showed moderately depressed LVEF without thrombus or vegetation. . Currently maintained on aspirin for CVA prophylaxis. Subcutaneous heparin for DVT prophylaxis. Diet has been advanced to a regular consistency  No BM for a couple days, still voiding but has increased freq  Review of Systems - Negative except back pain Objective: Vital Signs: Blood pressure 151/92, pulse 96, temperature 98  F (36.7 C), temperature source Oral, resp. rate 18, height 5\' 9"  (1.753 m), weight 121.1 kg (266 lb 15.6 oz), last menstrual period 12/21/2013, SpO2 99.00%. No results found. Results for orders placed during the hospital encounter of 01/15/14 (from the past 72 hour(s))  GLUCOSE, CAPILLARY     Status: Abnormal   Collection Time    01/26/14 11:19 AM      Result Value Ref Range   Glucose-Capillary 297 (*) 70 - 99 mg/dL   Comment 1 Notify RN    GLUCOSE, CAPILLARY     Status: Abnormal   Collection Time    01/26/14  4:24 PM      Result Value Ref Range   Glucose-Capillary 293 (*) 70 - 99 mg/dL   Comment 1 Notify RN    GLUCOSE, CAPILLARY     Status: Abnormal   Collection Time    01/26/14  8:40 PM      Result Value Ref Range   Glucose-Capillary 253 (*) 70 - 99 mg/dL  GLUCOSE, CAPILLARY     Status: Abnormal   Collection Time    01/27/14  7:04 AM      Result Value Ref Range   Glucose-Capillary 204 (*) 70 - 99 mg/dL   Comment 1 Notify RN    GLUCOSE, CAPILLARY     Status: Abnormal   Collection Time    01/27/14 11:27 AM      Result Value Ref Range   Glucose-Capillary 218 (*) 70 - 99 mg/dL   Comment 1 Notify RN    GLUCOSE, CAPILLARY     Status: Abnormal   Collection Time    01/27/14  4:26 PM      Result Value Ref Range   Glucose-Capillary 284 (*) 70 - 99 mg/dL    Comment 1 Notify RN    GLUCOSE, CAPILLARY     Status: Abnormal   Collection Time    01/27/14  8:42 PM      Result Value Ref Range   Glucose-Capillary 291 (*) 70 - 99 mg/dL  GLUCOSE, CAPILLARY     Status: Abnormal   Collection Time    01/28/14  7:21 AM      Result Value Ref Range   Glucose-Capillary 248 (*) 70 - 99 mg/dL   Comment 1 Notify RN    GLUCOSE, CAPILLARY     Status: Abnormal   Collection Time    01/28/14 11:31 AM      Result Value Ref Range   Glucose-Capillary 287 (*) 70 - 99 mg/dL   Comment 1 Notify RN    GLUCOSE, CAPILLARY     Status: Abnormal   Collection Time    01/28/14  4:38 PM      Result Value Ref Range   Glucose-Capillary 204 (*) 70 - 99 mg/dL  GLUCOSE, CAPILLARY     Status: Abnormal   Collection Time    01/28/14  8:51 PM      Result Value Ref Range   Glucose-Capillary 227 (*) 70 - 99 mg/dL  URINALYSIS, ROUTINE W REFLEX MICROSCOPIC     Status: Abnormal   Collection Time    01/29/14  5:33 AM      Result Value Ref Range   Color, Urine YELLOW  YELLOW   APPearance CLOUDY (*) CLEAR   Specific Gravity, Urine 1.030  1.005 - 1.030   pH 5.0  5.0 - 8.0   Glucose, UA >1000 (*) NEGATIVE mg/dL   Hgb urine dipstick NEGATIVE  NEGATIVE   Bilirubin Urine  NEGATIVE  NEGATIVE   Ketones, ur NEGATIVE  NEGATIVE mg/dL   Protein, ur NEGATIVE  NEGATIVE mg/dL   Urobilinogen, UA 0.2  0.0 - 1.0 mg/dL   Nitrite NEGATIVE  NEGATIVE   Leukocytes, UA SMALL (*) NEGATIVE  URINE MICROSCOPIC-ADD ON     Status: Abnormal   Collection Time    01/29/14  5:33 AM      Result Value Ref Range   Squamous Epithelial / LPF RARE  RARE   WBC, UA 11-20  <3 WBC/hpf   RBC / HPF 0-2  <3 RBC/hpf   Bacteria, UA MANY (*) RARE   Casts HYALINE CASTS (*) NEGATIVE  GLUCOSE, CAPILLARY     Status: Abnormal   Collection Time    01/29/14  7:22 AM      Result Value Ref Range   Glucose-Capillary 275 (*) 70 - 99 mg/dL   Comment 1 Notify RN       HEENT: normal tongue Cardio: RRR and no murmur Resp: CTA  B/L and unlabored GI: BS positive and non distended Extremity:  Pulses positive and No Edema Skin:   Intact Neuro: Alert/Oriented, Abnormal Sensory absent to LT LUE and intact LLE, Abnormal Motor 0/5 in LUE and LLE and Other no evidence of L field cut Musc/Skel:  Other back pain  Gen NAD   Assessment/Plan: 1. Functional deficits secondary to Cardioembolic R MCA infarct which require 3+ hours per day of interdisciplinary therapy in a comprehensive inpatient rehab setting. Physiatrist is providing close team supervision and 24 hour management of active medical problems listed below. Physiatrist and rehab team continue to assess barriers to discharge/monitor patient progress toward functional and medical goals. FIM: FIM - Bathing Bathing Steps Patient Completed: Chest;Left Arm;Abdomen;Right upper leg;Left upper leg;Right lower leg (including foot);Front perineal area Bathing: 3: Mod-Patient completes 5-7 5270f 10 parts or 50-74%  FIM - Upper Body Dressing/Undressing Upper body dressing/undressing steps patient completed: Thread/unthread right sleeve of pullover shirt/dresss;Put head through opening of pull over shirt/dress;Pull shirt over trunk;Thread/unthread right bra strap;Thread/unthread left bra strap Upper body dressing/undressing: 3: Mod-Patient completed 50-74% of tasks FIM - Lower Body Dressing/Undressing Lower body dressing/undressing steps patient completed: Thread/unthread right underwear leg;Thread/unthread right pants leg;Don/Doff right sock;Don/Doff right shoe Lower body dressing/undressing: 2: Max-Patient completed 25-49% of tasks  FIM - Toileting Toileting steps completed by patient:  (was wearing hospital gown ) Toileting Assistive Devices: Grab bar or rail for support Toileting: 1: Total-Patient completed zero steps, helper did all 3  FIM - Diplomatic Services operational officerToilet Transfers Toilet Transfers Assistive Devices: Grab bars;Bedside commode Toilet Transfers: 1-To toilet/BSC: Total A (helper  does all/Pt. < 25%);2-From toilet/BSC: Max A (lift and lower assist);1-Two helpers  FIM - BankerBed/Chair Transfer Bed/Chair Transfer Assistive Devices: Arm rests;Bed rails Bed/Chair Transfer: 3: Sit > Supine: Mod A (lifting assist/Pt. 50-74%/lift 2 legs);3: Chair or W/C > Bed: Mod A (lift or lower assist);3: Bed > Chair or W/C: Mod A (lift or lower assist);5: Supine > Sit: Supervision (verbal cues/safety issues)  FIM - Locomotion: Wheelchair Distance: 50 Locomotion: Wheelchair: 2: Travels 50 - 149 ft with supervision, cueing or coaxing FIM - Locomotion: Ambulation Locomotion: Ambulation Assistive Devices: Other (comment) (R hallway hand rail) Ambulation/Gait Assistance: 1: +1 Total assist Locomotion: Ambulation: 1: Travels less than 50 ft with total assistance/helper does all (Pt.<25%)  Comprehension Comprehension Mode: Auditory Comprehension: 5-Understands complex 90% of the time/Cues < 10% of the time  Expression Expression Mode: Verbal Expression: 5-Expresses complex 90% of the time/cues < 10% of the time  Social Interaction Social Interaction: 6-Interacts appropriately with others with medication or extra time (anti-anxiety, antidepressant).  Problem Solving Problem Solving: 5-Solves basic problems: With no assist  Memory Memory: 6-More than reasonable amt of time  Medical Problem List and Plan:  1. Cardioembolic right MCA infarct, left hemiparesis and LUE sensory def  2. DVT Prophylaxis/Anticoagulation: Subcutaneous heparin. Monitor platelet counts and any signs of bleeding  3. Pain Management/chronic pain syndrome: Tegretol 200 mg twice a day for neuropathic pain. Patient also on Flexeril prior to admission but held due to some lethargy.  4. Neuropsych: This patient is capable of making decisions on her own behalf.  5. Nonischemic cardiomyopathy, CAD with pacemaker, placed Sept 2014. No chest pain or shortness of breath  6. Diabetes mellitus with peripheral neuropathy.  UncontrolledHemoglobin A1c 10.7.Increase Lantus insulin 35 units each bedtime.titrate up Check blood sugars a.c. and at bedtime  7. Hypertension. Toprol-XL 25 mg twice a day, Lanoxin 0.125 mg daily. Monitor with increased activity.8. Chronic diastolic heart failure. Monitor for any signs of fluid overload. Daily i's and o's and weights. Patient on Lasix 40 mg twice a day prior to admission. Consider resuming back as needed monitor fluid intake 9. Hyperlipidemia. Lipitor 10.  Spasticity increase zanaflex at noc helpful 11.  Urinary retention- improving on  Urecholine (will reduce dose secondary to freq), and  Flomax, also check UA secondary to freq and hx of recurrent UTI  LOS (Days) 14 A FACE TO FACE EVALUATION WAS PERFORMED  [name] E 01/29/2014, 9:39 AM

## 2014-01-29 NOTE — Progress Notes (Signed)
Speech Language Pathology Daily Session Note  Patient Details  Name: [name] MRN: [id number] Date of Birth: [birth year]  Today's Date: 01/29/2014 Time: 1660-6004 Time Calculation (min): 43 min  Short Term Goals: Week 2: SLP Short Term Goal 1 (Week 2): Pt will demonstrate efficient mastication and oral clearance with trials of regular textures with Mod I SLP Short Term Goal 2 (Week 2): Pt will demonstrate functional problem solving for complex tasks with supervision verbal cues.  SLP Short Term Goal 3 (Week 2): Pt will utilize speech intelligibility strategies at the sentence level with Mod I.  SLP Short Term Goal 4 (Week 2): Pt will perfrom lingual and labial oral-motor exercises with Min A verbal and visual cues.  SLP Short Term Goal 5 (Week 2): Pt will attend to left field of enviornment/body during functional tasks with mod I.   Skilled Therapeutic Interventions: Skilled treatment focused on cognitive and swallowing goals. SLP facilitated session with trials of regular textures and thin liquids via straw, which pt consumed with adequate bolus formation and propulsion with no overt s/s of aspiration with Mod I. Recommend to trial regular tray prior to advancement. Pt also completed high level cognitive task, involving organizational skills, mental flexibility, working memory, and complex problem solving to create daily schedule. Pt required Min-Mod cues for this high-level task. Pt completed oral motor exercises with Min cues, and reports practicing them throughout the day. SLP observed mild improvement in lingual/labial ROM and strength. Continue plan of care.    FIM:  Comprehension Comprehension Mode: Auditory Comprehension: 5-Follows basic conversation/direction: With no assist Expression Expression Mode: Verbal Expression: 5-Expresses complex 90% of the time/cues < 10% of the time Social Interaction Social Interaction: 6-Interacts appropriately with others with medication or  extra time (anti-anxiety, antidepressant). Problem Solving Problem Solving: 5-Solves basic problems: With no assist Memory Memory: 5-Recognizes or recalls 90% of the time/requires cueing < 10% of the time FIM - Eating Eating Activity: 5: Set-up assist for open containers;6: Swallowing techniques: self-managed;6: More than reasonable amount of time  Pain Pain Assessment Pain Assessment: No/denies pain  Therapy/Group: Individual Therapy   Maxcine Ham, M.A. CCC-SLP [phone]   Maxcine Ham 01/29/2014, 4:02 PM

## 2014-01-29 NOTE — Progress Notes (Signed)
Occupational Therapy Session Note  Patient Details  Name: [name] MRN: [id number] Date of Birth: 10-07-1966  Today's Date: 01/29/2014 Time: 0900-1000 and 1100-1130 (co-tx with PT 1100-1200) Time Calculation (min): 60 min  Short Term Goals: Week 2:  OT Short Term Goal 1 (Week 2): Pt will complete tub transfer using TTB with max assist OT Short Term Goal 2 (Week 2): Pt will complete UB dressing with min assist  OT Short Term Goal 3 (Week 2): Pt will complete toilet task with max assist OT Short Term Goal 4 (Week 2): Pt will complete sit<>stand with mod assist for initiation and balance for 1 grooming task.   Skilled Therapeutic Interventions/Progress Updates:    Session 1: Pt seen for ADL retraining with focus on sit<>stand, postural control in standing, and functional transfers. Pt completed bathing and dressing sitting unsupported EOB. Provided PROM and stretching to LUE at beginning of session to increased functional use and assist with positioning. HOH assist provided to LUE for washing RUE. Pt required mod assist for sit<>stand for buttocks hygiene. Pt self-correcting for UB dressing hemi dressing technique and required min assist to complete. Pt progressed to min assist with min cues for hand placement during LB clothing management. Pt assisted with managing over R hip and min assist for facilitation for upright posture. At end of session, completed scoot pivot transfer to w/c to right with min assist to block L knee. Pt left sitting in w/c with RN present and all needs in reach.   Session 2: Pt seen for co-tx with PT (BH) this AM with focus on functional transfers, functional ambulation, positioning of LUE, and activity tolerance. Pt received sitting in w/c. Propelled self to ADL apartment in w/c. Practiced tub transfer using TTB. Completed into tub going to L with mod assist using stand pivot transfer and light mod assist when using Bobath squat pivot technique. Utilized leg lifter to  bring LLE into/out of tub with increased time and min-supervision for cues of positioning of LLE. Completed scoot transfers TTB >w/c to right side with min assist. Pt required min cues for safety throughout transfers. Pt required rest break then engaged in functional ambulation. See PT note for details. Utilized hand orthosis for proper positioning of LUE on RW and no discomfort noted. At end of session pt returned to bed and left with all needs in reach.   Therapy Documentation Precautions:  Precautions Precautions: Fall;ICD/Pacemaker Precaution Comments: pacemaker; chronic back pain Restrictions Weight Bearing Restrictions: No General:   Vital Signs: Therapy Vitals Temp: 98 F (36.7 C) Temp src: Oral Pulse Rate: 96 Resp: 18 BP: 151/92 mmHg Patient Position, if appropriate: Sitting Oxygen Therapy SpO2: 99 % O2 Device: None (Room air) Pain: Session 1: Pt reporting 7/10 pain in back and head. RN notified and pt unable to receive pain meds until 1100. Session 2: No report of pain   See FIM for current functional status  Therapy/Group: Individual Therapy; Session 2=Co-treatment  Daneil Dan 01/29/2014, 10:14 AM

## 2014-01-29 NOTE — Progress Notes (Signed)
Recreational Therapy Session Note  Patient Details  Name: [name] MRN: [id number] Date of Birth: [birth year] Today's Date: 01/29/2014  Pain: no c/o Skilled Therapeutic Interventions/Progress Updates: Session focused on w/c mobility & psychosocial support.   Pt propelled w/c on tile/carpeted surfaces to [facility] with supervision.  Pt tearful during session stating she just needed to get away from the rehab center.  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  Provided emotional support and encouragement.  Pt stated appreciation for opportunity to vent.   Mehran Guderian 01/29/2014, 3:59 PM

## 2014-01-29 NOTE — Progress Notes (Addendum)
2/17 Noted in progress notes that MD planned to increase Lantus to 35 units.  Fasting glucose has been in mid--high 200's.   Yes, please increase lantus from 20 units to 30-35 units lantus at HS and continue to titrate until fasting glucose is controlled to less than or equal to 150 mg/dL Once fastings are controlled, if post-prandial glucose is elevated, can add meal coverage of novolog 3-4 units tidwc.  Thank you, Lenor Coffin, RN, CNS, Diabetes Coordinator [phone])

## 2014-01-29 NOTE — Progress Notes (Signed)
Nursing Note: Pt felt full.Pt up to the bedside commode ,but unable to void.A: Pt in and out cathed by Jobelle,NT for 300 cc of urine w/ foul odor.Urine sent to the lab.Pt tolerated well.wbb

## 2014-01-30 ENCOUNTER — Inpatient Hospital Stay (HOSPITAL_COMMUNITY): Payer: Medicaid Other

## 2014-01-30 ENCOUNTER — Ambulatory Visit (HOSPITAL_COMMUNITY): Payer: Medicaid Other | Admitting: *Deleted

## 2014-01-30 LAB — GLUCOSE, CAPILLARY
GLUCOSE-CAPILLARY: 210 mg/dL — AB (ref 70–99)
GLUCOSE-CAPILLARY: 279 mg/dL — AB (ref 70–99)
Glucose-Capillary: 207 mg/dL — ABNORMAL HIGH (ref 70–99)

## 2014-01-30 LAB — URINE CULTURE

## 2014-01-30 NOTE — Progress Notes (Signed)
Physical Therapy Session Note  Patient Details  Name: [name] MRN: [id number] Date of Birth: 06-11-1966  Today's Date: 01/30/2014 Time: 1400-1500 Time Calculation (min): 60 min  Short Term Goals: Week 2:  PT Short Term Goal 1 (Week 2): Pt will consistently perform sit<>supine with mod A using bed rail with HOB flat. PT Short Term Goal 2 (Week 2): Pt will consistently perform  bed<>chair transfer with mod A. PT Short Term Goal 3 (Week 2): Pt will perform w/c mobility in controlled environment x150' with supervision and no rest breaks. PT Short Term Goal 4 (Week 2): Pt will perform static standing with single UE support x1 minute with min A. PT Short Term Goal 5 (Week 2): Pt will perform gait x10' in controlled environment with total A of single therapist.  Skilled Therapeutic Interventions/Progress Updates:    C-treatment with rec therapist. Pt received seated in w/c; agreeable to therapy. Session focused on increasing stability/independence with functional transfers, continued gait training with rolling walker. Pt performed w/c mobility 2x150' (to/from pt room) in controlled environment with supervision.  L AFO (blue rocker) donned to facilitate L ankle dorsiflexion; L heel wedge applied to prevent L genu recurvatum. Gait x8' in controlled environment with rolling walker, L hand orthosis, +2A with w/c follow. Gait trial ended secondary to pt difficulty maintaining L foot in shoe. Removed L heel wedge. Gait x37' in controlled environment with rolling walker, L hand orthosis, and +3Total A. PT positioned at L side providing manual facilitation of the following: R weight shift, LLE advancement, manual prevention of genu recurrvatum. Rec therapist positioned anterior to pt providing manual stabilization of LUE at L hand orthosis, advancement of rolling walker.   After returning to pt room, pt performed squat pivot from w/c>bed (Bobath technique) with mod A and manual stabilization of LLE.  Sit>supine with min A, increased time, using leg lifter to manage LLE. Therapists departed with pt semi-reclined in bed with 3 bed rails up, bed alarm on, and all needs within reach.  Therapy Documentation Precautions:  Precautions Precautions: Fall;ICD/Pacemaker Precaution Comments: pacemaker; chronic back pain Restrictions Weight Bearing Restrictions: No Pain: Pain Assessment Pain Assessment: No/denies pain Pain Score: 0-No pain Locomotion : Ambulation Ambulation/Gait Assistance: 1: +2 Total assist Wheelchair Mobility Distance: 150   See FIM for current functional status  Therapy/Group: Co-Treatment  Cheng Dec A 01/30/2014, 4:09 PM

## 2014-01-30 NOTE — Progress Notes (Signed)
Subjective/Complaints: 48 y.o. right-handed female with history of uncontrolled hypertension, diabetes mellitus peripheral neuropathy, morbid obesity, nonischemic cardiomyopathy, chronic diastolic heart failure, CAD status post pacemaker placement and chronic back pain followed at outpatient rehabilitation services. Admitted 01/09/2014 with left facial weakness and slurred speech. Patient with previous admission 10/07/2013 with similar symptoms workup negative as per neurology services. Cranial CT scan consistent with possible right middle cerebral artery territory infarct. MRI not completed secondary to pacemaker. Patient did not receive TPA. CTA view of the head negative for hemorrhage did show occluded branch MCA vessel. CTA of the neck was negative.. Echocardiogram with ejection fraction of 40% no wall motion abnormalities.. TEE showed moderately depressed LVEF without thrombus or vegetation. . Currently maintained on aspirin for CVA prophylaxis. Subcutaneous heparin for DVT prophylaxis. Diet has been advanced to a regular consistency  No BM for a couple days, still voiding but has increased freq  Review of Systems - Negative except back pain Objective: Vital Signs: Blood pressure 140/96, pulse 83, temperature 97.2 F (36.2 C), temperature source Oral, resp. rate 18, height 5\' 9"  (1.753 m), weight 122.9 kg (270 lb 15.1 oz), last menstrual period 12/21/2013, SpO2 96.00%. No results found. Results for orders placed during the hospital encounter of 01/15/14 (from the past 72 hour(s))  GLUCOSE, CAPILLARY     Status: Abnormal   Collection Time    01/27/14 11:27 AM      Result Value Ref Range   Glucose-Capillary 218 (*) 70 - 99 mg/dL   Comment 1 Notify RN    GLUCOSE, CAPILLARY     Status: Abnormal   Collection Time    01/27/14  4:26 PM      Result Value Ref Range   Glucose-Capillary 284 (*) 70 - 99 mg/dL   Comment 1 Notify RN    GLUCOSE, CAPILLARY     Status: Abnormal   Collection Time     01/27/14  8:42 PM      Result Value Ref Range   Glucose-Capillary 291 (*) 70 - 99 mg/dL  GLUCOSE, CAPILLARY     Status: Abnormal   Collection Time    01/28/14  7:21 AM      Result Value Ref Range   Glucose-Capillary 248 (*) 70 - 99 mg/dL   Comment 1 Notify RN    GLUCOSE, CAPILLARY     Status: Abnormal   Collection Time    01/28/14 11:31 AM      Result Value Ref Range   Glucose-Capillary 287 (*) 70 - 99 mg/dL   Comment 1 Notify RN    GLUCOSE, CAPILLARY     Status: Abnormal   Collection Time    01/28/14  4:38 PM      Result Value Ref Range   Glucose-Capillary 204 (*) 70 - 99 mg/dL  GLUCOSE, CAPILLARY     Status: Abnormal   Collection Time    01/28/14  8:51 PM      Result Value Ref Range   Glucose-Capillary 227 (*) 70 - 99 mg/dL  URINALYSIS, ROUTINE W REFLEX MICROSCOPIC     Status: Abnormal   Collection Time    01/29/14  5:33 AM      Result Value Ref Range   Color, Urine YELLOW  YELLOW   APPearance CLOUDY (*) CLEAR   Specific Gravity, Urine 1.030  1.005 - 1.030   pH 5.0  5.0 - 8.0   Glucose, UA >1000 (*) NEGATIVE mg/dL   Hgb urine dipstick NEGATIVE  NEGATIVE   Bilirubin Urine NEGATIVE  NEGATIVE   Ketones, ur NEGATIVE  NEGATIVE mg/dL   Protein, ur NEGATIVE  NEGATIVE mg/dL   Urobilinogen, UA 0.2  0.0 - 1.0 mg/dL   Nitrite NEGATIVE  NEGATIVE   Leukocytes, UA SMALL (*) NEGATIVE  URINE CULTURE     Status: None   Collection Time    01/29/14  5:33 AM      Result Value Ref Range   Specimen Description URINE, RANDOM     Special Requests NONE     Culture  Setup Time       Value: 01/29/2014 06:02     Performed at [facility] Count       Value: >=100,000 COLONIES/ML     Performed at [facility]   Culture       Value: ESCHERICHIA COLI     Performed at [facility]   Report Status PENDING    URINE MICROSCOPIC-ADD ON     Status: Abnormal   Collection Time    01/29/14  5:33 AM      Result Value Ref Range   Squamous Epithelial / LPF RARE   RARE   WBC, UA 11-20  <3 WBC/hpf   RBC / HPF 0-2  <3 RBC/hpf   Bacteria, UA MANY (*) RARE   Casts HYALINE CASTS (*) NEGATIVE  GLUCOSE, CAPILLARY     Status: Abnormal   Collection Time    01/29/14  7:22 AM      Result Value Ref Range   Glucose-Capillary 275 (*) 70 - 99 mg/dL   Comment 1 Notify RN    GLUCOSE, CAPILLARY     Status: Abnormal   Collection Time    01/29/14 12:01 PM      Result Value Ref Range   Glucose-Capillary 226 (*) 70 - 99 mg/dL   Comment 1 Notify RN    GLUCOSE, CAPILLARY     Status: Abnormal   Collection Time    01/29/14  4:34 PM      Result Value Ref Range   Glucose-Capillary 176 (*) 70 - 99 mg/dL   Comment 1 Notify RN    GLUCOSE, CAPILLARY     Status: Abnormal   Collection Time    01/29/14  9:33 PM      Result Value Ref Range   Glucose-Capillary 210 (*) 70 - 99 mg/dL  GLUCOSE, CAPILLARY     Status: Abnormal   Collection Time    01/30/14  7:22 AM      Result Value Ref Range   Glucose-Capillary 207 (*) 70 - 99 mg/dL   Comment 1 Notify RN       HEENT: normal tongue Cardio: RRR and no murmur Resp: CTA B/L and unlabored GI: BS positive and non distended Extremity:  Pulses positive and No Edema Skin:   Intact Neuro: Alert/Oriented, Abnormal Sensory absent to LT LUE and intact LLE, Abnormal Motor 0/5 in LUE and LLE and Other no evidence of L field cut Musc/Skel:  Other back pain  Gen NAD   Assessment/Plan: 1. Functional deficits secondary to Cardioembolic R MCA infarct which require 3+ hours per day of interdisciplinary therapy in a comprehensive inpatient rehab setting. Physiatrist is providing close team supervision and 24 hour management of active medical problems listed below. Physiatrist and rehab team continue to assess barriers to discharge/monitor patient progress toward functional and medical goals. FIM: FIM - Bathing Bathing Steps Patient Completed: Chest;Left Arm;Abdomen;Right upper leg;Left upper leg;Right lower leg (including foot);Front  perineal area Bathing:  3: Mod-Patient completes 5-7 22f 10 parts or 50-74%  FIM - Upper Body Dressing/Undressing Upper body dressing/undressing steps patient completed: Thread/unthread right sleeve of pullover shirt/dresss;Put head through opening of pull over shirt/dress;Pull shirt over trunk Upper body dressing/undressing: 4: Min-Patient completed 75 plus % of tasks FIM - Lower Body Dressing/Undressing Lower body dressing/undressing steps patient completed: Thread/unthread right underwear leg;Thread/unthread right pants leg;Don/Doff right sock;Don/Doff right shoe Lower body dressing/undressing: 2: Max-Patient completed 25-49% of tasks  FIM - Toileting Toileting steps completed by patient:  (was wearing hospital gown ) Toileting Assistive Devices: Grab bar or rail for support Toileting: 1: Two helpers  FIM - Diplomatic Services operational officer Devices: Psychiatrist Transfers: 2-To toilet/BSC: Max A (lift and lower assist);2-From toilet/BSC: Max A (lift and lower assist)  FIM - Press photographer Assistive Devices: Arm rests;Bed rails Bed/Chair Transfer: 3: Supine > Sit: Mod A (lifting assist/Pt. 50-74%/lift 2 legs;3: Sit > Supine: Mod A (lifting assist/Pt. 50-74%/lift 2 legs);3: Bed > Chair or W/C: Mod A (lift or lower assist);3: Chair or W/C > Bed: Mod A (lift or lower assist)  FIM - Locomotion: Wheelchair Distance: x170, x150 Locomotion: Wheelchair: 5: Travels 150 ft or more: maneuvers on rugs and over door sills with supervision, cueing or coaxing FIM - Locomotion: Ambulation Locomotion: Ambulation Assistive Devices: Other (comment);Walker - Rolling (R hallway hand rail initially; then rolling walker with L hand orthosis) Ambulation/Gait Assistance: 1: +2 Total assist Locomotion: Ambulation: 0: Activity did not occur  Comprehension Comprehension Mode: Auditory Comprehension: 5-Follows basic conversation/direction: With no  assist  Expression Expression Mode: Verbal Expression: 5-Expresses complex 90% of the time/cues < 10% of the time  Social Interaction Social Interaction: 6-Interacts appropriately with others with medication or extra time (anti-anxiety, antidepressant).  Problem Solving Problem Solving: 5-Solves basic problems: With no assist  Memory Memory: 5-Recognizes or recalls 90% of the time/requires cueing < 10% of the time  Medical Problem List and Plan:  1. Cardioembolic right MCA infarct, left hemiparesis and LUE sensory def  2. DVT Prophylaxis/Anticoagulation: Subcutaneous heparin. Monitor platelet counts and any signs of bleeding  3. Pain Management/chronic pain syndrome: Tegretol 200 mg twice a day for neuropathic pain. Patient also on Flexeril prior to admission but held due to some lethargy.  4. Neuropsych: This patient is capable of making decisions on her own behalf.  5. Nonischemic cardiomyopathy, CAD with pacemaker, placed Sept 2014. No chest pain or shortness of breath  6. Diabetes mellitus with peripheral neuropathy. UncontrolledHemoglobin A1c 10.7.Increase Lantus insulin 35 units each bedtime.titrate up Check blood sugars a.c. and at bedtime  7. Hypertension. Toprol-XL 25 mg twice a day, Lanoxin 0.125 mg daily. Monitor with increased activity.8. Chronic diastolic heart failure. Monitor for any signs of fluid overload. Daily i's and o's and weights. Patient on Lasix 40 mg twice a day prior to admission. Consider resuming back as needed monitor fluid intake 9. Hyperlipidemia. Lipitor 10.  Spasticity increase zanaflex at noc helpful 11.  Urinary retention- recurrent after reducing Urecholine, resume 25mg  dose , and  Flomax,UA +, add keflex  LOS (Days) 15 A FACE TO FACE EVALUATION WAS PERFORMED  01/30/2014, 7:49 AM   Subjective/Complaints: 48 y.o. right-handed female with history of uncontrolled hypertension, diabetes mellitus peripheral neuropathy, morbid  obesity, nonischemic cardiomyopathy, chronic diastolic heart failure, CAD status post pacemaker placement and chronic back pain followed at outpatient rehabilitation services. Admitted 01/09/2014 with left facial weakness and slurred speech. Patient with previous admission 10/07/2013 with similar symptoms  workup negative as per neurology services. Cranial CT scan consistent with possible right middle cerebral artery territory infarct. MRI not completed secondary to pacemaker. Patient did not receive TPA. CTA view of the head negative for hemorrhage did show occluded branch MCA vessel. CTA of the neck was negative.. Echocardiogram with ejection fraction of 40% no wall motion abnormalities.. TEE showed moderately depressed LVEF without thrombus or vegetation. . Currently maintained on aspirin for CVA prophylaxis. Subcutaneous heparin for DVT prophylaxis. Diet has been advanced to a regular consistency  Now requiring ICP again  Review of Systems - Negative except back pain Objective: Vital Signs: Blood pressure 140/96, pulse 83, temperature 97.2 F (36.2 C), temperature source Oral, resp. rate 18, height 5\' 9"  (1.753 m), weight 122.9 kg (270 lb 15.1 oz), last menstrual period 12/21/2013, SpO2 96.00%. No results found. Results for orders placed during the hospital encounter of 01/15/14 (from the past 72 hour(s))  GLUCOSE, CAPILLARY     Status: Abnormal   Collection Time    01/27/14 11:27 AM      Result Value Ref Range   Glucose-Capillary 218 (*) 70 - 99 mg/dL   Comment 1 Notify RN    GLUCOSE, CAPILLARY     Status: Abnormal   Collection Time    01/27/14  4:26 PM      Result Value Ref Range   Glucose-Capillary 284 (*) 70 - 99 mg/dL   Comment 1 Notify RN    GLUCOSE, CAPILLARY     Status: Abnormal   Collection Time    01/27/14  8:42 PM      Result Value Ref Range   Glucose-Capillary 291 (*) 70 - 99 mg/dL  GLUCOSE, CAPILLARY     Status: Abnormal   Collection Time    01/28/14  7:21 AM       Result Value Ref Range   Glucose-Capillary 248 (*) 70 - 99 mg/dL   Comment 1 Notify RN    GLUCOSE, CAPILLARY     Status: Abnormal   Collection Time    01/28/14 11:31 AM      Result Value Ref Range   Glucose-Capillary 287 (*) 70 - 99 mg/dL   Comment 1 Notify RN    GLUCOSE, CAPILLARY     Status: Abnormal   Collection Time    01/28/14  4:38 PM      Result Value Ref Range   Glucose-Capillary 204 (*) 70 - 99 mg/dL  GLUCOSE, CAPILLARY     Status: Abnormal   Collection Time    01/28/14  8:51 PM      Result Value Ref Range   Glucose-Capillary 227 (*) 70 - 99 mg/dL  URINALYSIS, ROUTINE W REFLEX MICROSCOPIC     Status: Abnormal   Collection Time    01/29/14  5:33 AM      Result Value Ref Range   Color, Urine YELLOW  YELLOW   APPearance CLOUDY (*) CLEAR   Specific Gravity, Urine 1.030  1.005 - 1.030   pH 5.0  5.0 - 8.0   Glucose, UA >1000 (*) NEGATIVE mg/dL   Hgb urine dipstick NEGATIVE  NEGATIVE   Bilirubin Urine NEGATIVE  NEGATIVE   Ketones, ur NEGATIVE  NEGATIVE mg/dL   Protein, ur NEGATIVE  NEGATIVE mg/dL   Urobilinogen, UA 0.2  0.0 - 1.0 mg/dL   Nitrite NEGATIVE  NEGATIVE   Leukocytes, UA SMALL (*) NEGATIVE  URINE CULTURE     Status: None   Collection Time    01/29/14  5:33 AM  Result Value Ref Range   Specimen Description URINE, RANDOM     Special Requests NONE     Culture  Setup Time       Value: 01/29/2014 06:02     Performed at [facility] Count       Value: >=100,000 COLONIES/ML     Performed at [facility]   Culture       Value: ESCHERICHIA COLI     Performed at [facility]   Report Status PENDING    URINE MICROSCOPIC-ADD ON     Status: Abnormal   Collection Time    01/29/14  5:33 AM      Result Value Ref Range   Squamous Epithelial / LPF RARE  RARE   WBC, UA 11-20  <3 WBC/hpf   RBC / HPF 0-2  <3 RBC/hpf   Bacteria, UA MANY (*) RARE   Casts HYALINE CASTS (*) NEGATIVE  GLUCOSE, CAPILLARY     Status: Abnormal    Collection Time    01/29/14  7:22 AM      Result Value Ref Range   Glucose-Capillary 275 (*) 70 - 99 mg/dL   Comment 1 Notify RN    GLUCOSE, CAPILLARY     Status: Abnormal   Collection Time    01/29/14 12:01 PM      Result Value Ref Range   Glucose-Capillary 226 (*) 70 - 99 mg/dL   Comment 1 Notify RN    GLUCOSE, CAPILLARY     Status: Abnormal   Collection Time    01/29/14  4:34 PM      Result Value Ref Range   Glucose-Capillary 176 (*) 70 - 99 mg/dL   Comment 1 Notify RN    GLUCOSE, CAPILLARY     Status: Abnormal   Collection Time    01/29/14  9:33 PM      Result Value Ref Range   Glucose-Capillary 210 (*) 70 - 99 mg/dL  GLUCOSE, CAPILLARY     Status: Abnormal   Collection Time    01/30/14  7:22 AM      Result Value Ref Range   Glucose-Capillary 207 (*) 70 - 99 mg/dL   Comment 1 Notify RN       HEENT: normal tongue Cardio: RRR and no murmur Resp: CTA B/L and unlabored GI: BS positive and non distended Extremity:  Pulses positive and No Edema Skin:   Intact Neuro: Alert/Oriented, Abnormal Sensory absent to LT LUE and intact LLE, Abnormal Motor 0/5 in LUE and LLE and Other no evidence of L field cut Musc/Skel:  Other back pain  Gen NAD   Assessment/Plan: 1. Functional deficits secondary to Cardioembolic R MCA infarct which require 3+ hours per day of interdisciplinary therapy in a comprehensive inpatient rehab setting. Physiatrist is providing close team supervision and 24 hour management of active medical problems listed below. Physiatrist and rehab team continue to assess barriers to discharge/monitor patient progress toward functional and medical goals. FIM: FIM - Bathing Bathing Steps Patient Completed: Chest;Left Arm;Abdomen;Right upper leg;Left upper leg;Right lower leg (including foot);Front perineal area Bathing: 3: Mod-Patient completes 5-7 27f 10 parts or 50-74%  FIM - Upper Body Dressing/Undressing Upper body dressing/undressing steps patient completed:  Thread/unthread right sleeve of pullover shirt/dresss;Put head through opening of pull over shirt/dress;Pull shirt over trunk Upper body dressing/undressing: 4: Min-Patient completed 75 plus % of tasks FIM - Lower Body Dressing/Undressing Lower body dressing/undressing steps patient completed: Thread/unthread right underwear leg;Thread/unthread right pants  leg;Don/Doff right sock;Don/Doff right shoe Lower body dressing/undressing: 2: Max-Patient completed 25-49% of tasks  FIM - Toileting Toileting steps completed by patient:  (was wearing hospital gown ) Toileting Assistive Devices: Grab bar or rail for support Toileting: 1: Two helpers  FIM - Diplomatic Services operational officer Devices: Psychiatrist Transfers: 2-To toilet/BSC: Max A (lift and lower assist);2-From toilet/BSC: Max A (lift and lower assist)  FIM - Press photographer Assistive Devices: Arm rests;Bed rails Bed/Chair Transfer: 3: Supine > Sit: Mod A (lifting assist/Pt. 50-74%/lift 2 legs;3: Sit > Supine: Mod A (lifting assist/Pt. 50-74%/lift 2 legs);3: Bed > Chair or W/C: Mod A (lift or lower assist);3: Chair or W/C > Bed: Mod A (lift or lower assist)  FIM - Locomotion: Wheelchair Distance: x170, x150 Locomotion: Wheelchair: 5: Travels 150 ft or more: maneuvers on rugs and over door sills with supervision, cueing or coaxing FIM - Locomotion: Ambulation Locomotion: Ambulation Assistive Devices: Other (comment);Walker - Rolling (R hallway hand rail initially; then rolling walker with L hand orthosis) Ambulation/Gait Assistance: 1: +2 Total assist Locomotion: Ambulation: 0: Activity did not occur  Comprehension Comprehension Mode: Auditory Comprehension: 5-Follows basic conversation/direction: With no assist  Expression Expression Mode: Verbal Expression: 5-Expresses complex 90% of the time/cues < 10% of the time  Social Interaction Social Interaction: 6-Interacts appropriately with  others with medication or extra time (anti-anxiety, antidepressant).  Problem Solving Problem Solving: 5-Solves basic problems: With no assist  Memory Memory: 5-Recognizes or recalls 90% of the time/requires cueing < 10% of the time  Medical Problem List and Plan:  1. Cardioembolic right MCA infarct, left hemiparesis and LUE sensory def  2. DVT Prophylaxis/Anticoagulation: Subcutaneous heparin. Monitor platelet counts and any signs of bleeding  3. Pain Management/chronic pain syndrome: Tegretol 200 mg twice a day for neuropathic pain. Patient also on Flexeril prior to admission but held due to some lethargy.  4. Neuropsych: This patient is capable of making decisions on her own behalf.  5. Nonischemic cardiomyopathy, CAD with pacemaker, placed Sept 2014. No chest pain or shortness of breath  6. Diabetes mellitus with peripheral neuropathy. UncontrolledHemoglobin A1c 10.7.Increase Lantus insulin 35 units each bedtime.titrate up Check blood sugars a.c. and at bedtime  7. Hypertension. Toprol-XL 25 mg twice a day, Lanoxin 0.125 mg daily. Monitor with increased activity.8. Chronic diastolic heart failure. Monitor for any signs of fluid overload. Daily i's and o's and weights. Patient on Lasix 40 mg twice a day prior to admission. Consider resuming back as needed monitor fluid intake 9. Hyperlipidemia. Lipitor 10.  Spasticity increase zanaflex at noc helpful 11.  Urinary retention- improving on  Urecholine (will reduce dose secondary to freq), and  Flomax, also check UA secondary to freq and hx of recurrent UTI  LOS (Days) 15 A FACE TO FACE EVALUATION WAS PERFORMED  [name] E 01/30/2014, 7:49 AM

## 2014-01-30 NOTE — Patient Care Conference (Signed)
Inpatient RehabilitationTeam Conference and Plan of Care Update Date: 01/30/2014   Time: 11:40 AM    Patient Name: [name]      Medical Record Number: [id number]  Date of Birth: 05-Jun-1966 Sex: Female         Room/Bed: 4W09C/4W09C-01 Payor Info: Payor: MEDICAID [place] / Plan: MEDICAID [place] ACCESS / Product Type: *No Product type* /    Admitting Diagnosis: R MCA infarct  Admit Date/Time:  01/15/2014  6:11 PM Admission Comments: No comment available   Primary Diagnosis:  <principal problem not specified> Principal Problem: <principal problem not specified>  Patient Active Problem List   Diagnosis Date Noted  . CVA (cerebral infarction) 01/12/2014  . Aphasia 01/09/2014  . Perimenopausal menorrhagia 12/20/2013  . Candida vaginitis 12/20/2013  . Pyuria 12/20/2013  . Biventricular implantable cardiac defibrillator -[employer]onic 11/23/2013  . Mixed stress and urge urinary incontinence 10/11/2013  . Headache(784.0) 10/06/2013  . Precordial pain 09/25/2013  . Preventative health care 09/13/2013  . Chronic combined systolic and diastolic heart failure 08/01/2013  . Nonischemic cardiomyopathy 08/01/2013  . Goiter 08/01/2013  . LBBB (left bundle branch block) 07/19/2013  . Prolonged Q-T interval on ECG 06/06/2013  . Depression 04/09/2013  . Myofascial pain 04/02/2013  . Osteoarthrosis, localized, primary, knee, bilateral 04/14/2012  . Lumbar spondylosis 04/14/2012  . Diabetic peripheral neuropathy 04/14/2012  . Obesity 11/23/2011  . Hyperlipidemia 11/23/2011  . Type 2 diabetes mellitus, uncontrolled, with neuropathy 09/03/2011  . OSA (obstructive sleep apnea) 08/27/2011  . ESSENTIAL HYPERTENSION 08/27/2010  . CHEST PAIN, HX OF 08/27/2010    Expected Discharge Date: Expected Discharge Date: 02/05/14  Team Members Present: Physician leading conference: Dr. Claudette Laws Social Worker Present: Staci Acosta, LCSW;Becky Malka Bocek, LCSW Nurse Present: Other (comment) Cala Bradford  Cook-RN) PT Present: Wanda Plump, PT;Other (comment) Anson Fret) OT Present: Bretta Bang, Carollee Sires, OT SLP Present: Maxcine Ham, SLP PPS Coordinator present : Edson Snowball, Chapman Fitch, RN, CRRN     Current Status/Progress Goal Weekly Team Focus  Medical   occ urinary retention  occ inc, no caths  med management , toileting program   Bowel/Bladder   in/out cath as needed, pt voiding , 1 incontinenet bladder episode, Urecholine 10mg  and Flomax given as scheduled, U/A to be collected, continent of Bowel LBM 2/14  pt will continue to be able to void and empty bladder  educate on UTI symptoms   Swallow/Nutrition/ Hydration   dys 3 textures and thin liquids, Mod I for strategies, trialing regular textures  Mod I with least restrictive PO  advanced trials   ADL's   max assist LB dressing, mod assist bathing and transfers, min assist UB dressing, mod-min assist tub transfer (squat pivot)  mod assist toilet task and transfers, min assist bathing and dressing   postural control in standing, L NMR, functional transfers, family education/training, standing balance, strengthening   Mobility   Supervision-Mod A for bed mobility, Mod A with transfers, supervision with w/c mobility, +1Total A with gait  Min A overall, with exception of gait (Mod A)  Endurance, postural/gait stability, functional transfers, L NMR, standing balance   Communication   Min cues for oral motor exercises, Mod I for intelligibility at the conversational level  Mod I for intelligibility at the sentence level  oral motor exercises   Safety/Cognition/ Behavioral Observations  Min cues for high level cognitive tasks  Mod I  complex problem solving, organizational skills, working memory   Pain   c/o mild headache, tylenol given prn with  noted effectiveness  pain managed less than 3  educate on medication management and repositioning    Skin   skin thin under abdominal folds, microguard powder applied  prn, Barrier cream applied to buttock/sacral area to prevent breakdown  skin will remain free of breakdown  monitor skin every shift, educate on importance of turning and repositioning       *See Care Plan and progress notes for long and short-term goals.  Barriers to Discharge: mod A , family hasn't been in much    Possible Resolutions to Barriers:  ?increase caregiver aide    Discharge Planning/Teaching Needs:  Need to begin family education with family since discharge coming up.  Would she benefit from longer stay?      Team Discussion:  Pt making good progress-need for family education to begin.  UTI being treated-work on timed tolieting.   Revisions to Treatment Plan:  Prepare for discharge 2/24   Continued Need for Acute Rehabilitation Level of Care: The patient requires daily medical management by a physician with specialized training in physical medicine and rehabilitation for the following conditions: Daily direction of a multidisciplinary physical rehabilitation program to ensure safe treatment while eliciting the highest outcome that is of practical value to the patient.: Yes Daily medical management of patient stability for increased activity during participation in an intensive rehabilitation regime.: Yes Daily analysis of laboratory values and/or radiology reports with any subsequent need for medication adjustment of medical intervention for : Neurological problems  Daralyn Bert, Lemar Livings 01/30/2014, 4:14 PM

## 2014-01-30 NOTE — Progress Notes (Signed)
2/18 AC  Lantus dose still at 20 units. Hopefully will go home on her home meds prior to admission. Noted in progress notes that MD planned to increase Lantus to 35 units.  Fasting glucose has been in mid--high 200's.   Yes, please increase lantus from 20 units to 30-35 units lantus at HS and continue to titrate until fasting glucose is controlled to less than or equal to 150 mg/dL Once fastings are controlled, if post-prandial glucose is elevated, can add meal coverage of novolog 3-4 units tidwc.  Thank you, Lenor Coffin, RN, CNS, Diabetes Coordinator [phone])

## 2014-01-30 NOTE — Consult Note (Signed)
  NEUROPSYCHOLOGICAL DIAGNOSTIC INTERVIEW - [facility]   [name] is a 48 year old, right-handed woman, who was seen for a diagnostic clinical interview to evaluate her emotional status in the setting of stroke.  According to her medical record, [name] was admitted on 01/09/14 with left facial weakness and slurred speech.  She had a previous admission on 10/07/14 for CVA.  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  MRI was not completed due to pacemaker.  She did not receive TPA.  According to members of her treatment team, [name] recently received news that her sister and cousin passed away and there was concern regarding her current emotional state in those circumstances and as such, a neuropsychological consult was requested.    [name] discussed the deaths of her cousin and sister, as well as the poor health state of several other close family members, but she did so without expressing emotion of any kind.  She continuously made statements suggesting that she was "dealing with it," but was unable to tap into any of her emotions.  We spent time exploring her relationship with her sister, with whom she was reportedly quite close and I asked her to visualize discharging to her home and not having her sister there to begin to process those emotions, but she was unable or unwilling to do so.  She also stated that she was handling her medical situation well, stating, "There are a lot of people here who are worse than me and younger than me," which she said gave her perspective.  Given that [name] was not able to engage in dialogue about the emotions surrounding her current grief; we spent the session discussing coping strategies that she can use if the grief becomes overwhelming at some point.  Some of the strategies she identified included using prayer and talking to other family members about her grief.   She was encouraged to request another meeting with the neuropsychologist should she feel that she would like to discuss her emotional reactions in more depth prior to her discharge.    During the session today, [name] denied symptoms of depression, including suicidal ideation.  It appeared as though her current emotional state is completely related to bereavement.    DIAGNOSIS: CVA Bereavement  Leavy Cella, Psy.D.  Clinical Neuropsychologist

## 2014-01-30 NOTE — Progress Notes (Signed)
Speech Language Pathology Weekly Progress and Session Notes  Patient Details  Name: [name] MRN: [id number] Date of Birth: 10/26/1966  Today's Date: 01/30/2014 Time: 4098-1191 Time Calculation (min): 40 min  Short Term Goals: Week 2: SLP Short Term Goal 1 (Week 2): Pt will demonstrate efficient mastication and oral clearance with trials of regular textures with Mod I SLP Short Term Goal 1 - Progress (Week 2): Met SLP Short Term Goal 2 (Week 2): Pt will demonstrate functional problem solving for complex tasks with supervision verbal cues.  SLP Short Term Goal 2 - Progress (Week 2): Progressing toward goal SLP Short Term Goal 3 (Week 2): Pt will utilize speech intelligibility strategies at the sentence level with Mod I.  SLP Short Term Goal 3 - Progress (Week 2): Not met SLP Short Term Goal 4 (Week 2): Pt will perfrom lingual and labial oral-motor exercises with Min A verbal and visual cues.  SLP Short Term Goal 4 - Progress (Week 2): Progressing toward goal SLP Short Term Goal 5 (Week 2): Pt will attend to left field of enviornment/body during functional tasks with mod I.  SLP Short Term Goal 5 - Progress (Week 2): Met   New Short Term Goals: Week 2: SLP Short Term Goal 1 (Week 2): Pt will demonstrate efficient mastication and oral clearance with trials of regular textures with Mod I SLP Short Term Goal 1 - Progress (Week 2): Met SLP Short Term Goal 2 (Week 2): Pt will demonstrate functional problem solving for complex tasks with supervision verbal cues.  SLP Short Term Goal 2 - Progress (Week 2): Progressing toward goal SLP Short Term Goal 3 (Week 2): Pt will utilize speech intelligibility strategies at the sentence level with Mod I.  SLP Short Term Goal 3 - Progress (Week 2): Not met SLP Short Term Goal 4 (Week 2): Pt will perfrom lingual and labial oral-motor exercises with Min A verbal and visual cues.  SLP Short Term Goal 4 - Progress (Week 2): Progressing toward goal SLP  Short Term Goal 5 (Week 2): Pt will attend to left field of enviornment/body during functional tasks with mod I.  SLP Short Term Goal 5 - Progress (Week 2): Met  Weekly Progress Updates: Pt has met 2 out of 5 STGs during this reporting period, due to advancement in cognitive and swallowing function. She is continuing trials of regular textures, with plan to trial regular consistency tray during next therapy session. She continues to require Min-Mod A with higher level executive functions during complex tasks, although pt demonstrates adequate anticipatory awareness of needs with Mod I. She also reports mild difficulty with organization at baseline, and therefore goals have been downgraded to supervision for complex problem solving. She is intelligible at the conversational level, but requires Min-Mod cues to perform oral motor exercises with questionable motor planning deficit. Pt will benefit from continued SLP services to maximize functional independence, swallowing safety, and functional communication prior to discharge home.   Intensity: Minumum of 1-2 x/day, 30 to 90 minutes Frequency: 5 out of 7 days Duration/Length of Stay: 21-26 days Treatment/Interventions: Cueing hierarchy;Cognitive remediation/compensation;Environmental controls;Functional tasks;Speech/Language facilitation;Therapeutic Activities;Patient/family education;Dysphagia/aspiration precaution training;Internal/external aids   Daily Session Skilled Therapeutic Interventions: Skilled treatment focused on cognitive, speech, and swallowing goals. SLP facilitated session with Min-Mod cues for oral motor exercises due to possible motor planning component, characterized by intermittent groping behaviors. Pt demonstrated anticipatory awareness and basic problem solving related to bed mobility to reposition for comfort with Mod I. Continue plan of  care.        FIM:  Comprehension Comprehension Mode: Auditory Comprehension:  5-Understands complex 90% of the time/Cues < 10% of the time Expression Expression Mode: Verbal Expression: 5-Expresses complex 90% of the time/cues < 10% of the time Social Interaction Social Interaction: 6-Interacts appropriately with others with medication or extra time (anti-anxiety, antidepressant). Problem Solving Problem Solving: 5-Solves complex 90% of the time/cues < 10% of the time Memory Memory: 6-More than reasonable amt of time General    Pain Pain Assessment Pain Assessment: No/denies pain Pain Score: 0-No pain  Therapy/Group: Individual Therapy   Germain Osgood, M.A. CCC-SLP [phone]   Germain Osgood 01/30/2014, 10:24 AM

## 2014-01-30 NOTE — Progress Notes (Signed)
Occupational Therapy Weekly Progress Note  Patient Details  Name: [name] MRN: [id number] Date of Birth: 01-06-1966  Today's Date: 01/30/2014 Time: 5188-4166 and 0630-1601 Time Calculation (min): 55 min and 30 min   Patient has met 3 of 4 short term goals.  Patient has been progressing well during her rehab stay and is currently a mod assist squat pivot transfer and max assist stand pivot transfer (toilet transfer). She is demonstrating improved postural control in standing requiring min-mod assist for standing balance during self-care tasks. She has increased tone in LUE and is tolerating PROM and light stretching well. She has been educated on self-ROM tasks as well. Planning for family education and training this week.    Patient continues to demonstrate the following deficits: L hemiplegia, decreased coordination, decreased standing balance, decreased postural control in standing, decreased strength, decreased activity tolerance, decreased ability to compensate for deficits, decreased balance reactions and therefore will continue to benefit from skilled OT intervention to enhance overall performance with BADLs, standing balance, functoinal transfers, functional use of LUE, ability to compensate for deficits.  Patient progressing toward long term goals..  Continue plan of care. Upgraded LTG for toilet transfer to min assist  OT Short Term Goals Week 2:  OT Short Term Goal 1 (Week 2): Pt will complete tub transfer using TTB with max assist OT Short Term Goal 1 - Progress (Week 2): Met OT Short Term Goal 2 (Week 2): Pt will complete UB dressing with min assist  OT Short Term Goal 2 - Progress (Week 2): Met OT Short Term Goal 3 (Week 2): Pt will complete toilet task with max assist OT Short Term Goal 3 - Progress (Week 2): Not met OT Short Term Goal 4 (Week 2): Pt will complete sit<>stand with mod assist for initiation and balance for 1 grooming task.  OT Short Term Goal 4 - Progress  (Week 2): Met Week 3:  OT Short Term Goal 1 (Week 3): Focus on LTGs   Skilled Therapeutic Interventions/Progress Updates:    Session 1: Pt seen for ADL retraining with focus on sit<>stand, PROM of LUE, and functional transfers. Pt received sitting in w/c and requesting to complete bathing at sink vs shower. Therapist provided PROM and light stretching to LUE with min pain noted with elbow extension. Provided HOH assist for LUE to wash RUE and upper LLE. Completed sit<>stand with cues for hand placement and mod assist then progressing to min assist. Pt with min assist standing balance approx 3-5 seconds to assist with managing clothing over R hip. Focused on anterior weight shift in preparation for sit<>stand and functional transfers for LB dressing. At end of session pt completed scoot transfer w/c>reclinre chair with min assist and min assist for postioning in chair. Pt left with all needs in reach.   Session 2: Pt seen for 1:1 OT session with focus on functional transfers and NMR to LUE. Pt received sitting in w/c. Discussed bathroom setup at home in regards to toilet transfer and pt reporting she was going to have someone install grab bar by toilet. Practiced stand pivot toilet transfer with RW and no grab w/c<>toilet with mod assist and increased time with therapist advancing LLE. Practiced stand pivot transfer without RW and using grab bars w/c<>toilet with lighter mod assist and cues for positioning of RUE. Pt feels more comfortable about using grab bar for support and decreased time with transfers and informed therapist she would call a friend to install grab bar before discharge. Participated  in PROM of LUE while sitting upright in w/c. Completed 6-8 reps of shoulder flexion/ext, abd/add, elbow flex/ext. Pt with trace activation during retraction. At end of session pt left sitting in w/c with all needs in reach.   Therapy Documentation Precautions:  Precautions Precautions:  Fall;ICD/Pacemaker Precaution Comments: pacemaker; chronic back pain Restrictions Weight Bearing Restrictions: No General:   Vital Signs:   Pain: Session 1: Pt reporting 7/10 pain in head and back. RN provided pain meds.  Session 2: No report of pain during therapy session.   See FIM for current functional status  Therapy/Group: Individual Therapy  Ovie Cornelio, Quillian Quince 01/30/2014, 11:08 AM

## 2014-01-30 NOTE — Progress Notes (Signed)
Social Work Patient ID: [name], female   DOB: [birth year], 48 y.o.   MRN: [id number] Met with pt and spoke with son via telephone to discuss team conference progression toward goals and discharge coming up 2/24. Have scheduled son to begin family education on Friday at 10;00 and then can have come in Monday if needed.  Will try to get CNA who works with pt Already to come in and also daughter since she will be with her at night.  Pt is on board they may need couple of days to learn her care.  She is pleased with her Progress and the fact she is walking some in therapy.  See how Friday goes with son.

## 2014-01-31 ENCOUNTER — Inpatient Hospital Stay (HOSPITAL_COMMUNITY): Payer: Medicaid Other

## 2014-01-31 ENCOUNTER — Inpatient Hospital Stay (HOSPITAL_COMMUNITY): Payer: Medicaid Other | Admitting: Physical Therapy

## 2014-01-31 ENCOUNTER — Inpatient Hospital Stay (HOSPITAL_COMMUNITY): Payer: Medicaid Other | Admitting: Occupational Therapy

## 2014-01-31 DIAGNOSIS — I10 Essential (primary) hypertension: Secondary | ICD-10-CM

## 2014-01-31 DIAGNOSIS — E1149 Type 2 diabetes mellitus with other diabetic neurological complication: Secondary | ICD-10-CM

## 2014-01-31 DIAGNOSIS — I428 Other cardiomyopathies: Secondary | ICD-10-CM

## 2014-01-31 DIAGNOSIS — G8110 Spastic hemiplegia affecting unspecified side: Secondary | ICD-10-CM

## 2014-01-31 DIAGNOSIS — E1142 Type 2 diabetes mellitus with diabetic polyneuropathy: Secondary | ICD-10-CM

## 2014-01-31 DIAGNOSIS — I5032 Chronic diastolic (congestive) heart failure: Secondary | ICD-10-CM

## 2014-01-31 DIAGNOSIS — I6340 Cerebral infarction due to embolism of unspecified cerebral artery: Secondary | ICD-10-CM

## 2014-01-31 LAB — GLUCOSE, CAPILLARY
GLUCOSE-CAPILLARY: 274 mg/dL — AB (ref 70–99)
GLUCOSE-CAPILLARY: 237 mg/dL — AB (ref 70–99)
GLUCOSE-CAPILLARY: 223 mg/dL — AB (ref 70–99)
Glucose-Capillary: 299 mg/dL — ABNORMAL HIGH (ref 70–99)
Glucose-Capillary: 249 mg/dL — ABNORMAL HIGH (ref 70–99)
Glucose-Capillary: 204 mg/dL — ABNORMAL HIGH (ref 70–99)

## 2014-01-31 MED ORDER — BETHANECHOL CHLORIDE 25 MG PO TABS
25.0000 mg | ORAL_TABLET | Freq: Four times a day (QID) | ORAL | Status: DC
  Administered 2014-01-31 – 2014-02-05 (×21): 25 mg via ORAL
  Filled 2014-01-31 (×25): qty 1

## 2014-01-31 NOTE — Progress Notes (Signed)
Occupational Therapy Session Note  Patient Details  Name: [name] MRN: [id number] Date of Birth: 1966/10/31  Today's Date: 01/31/2014 Time: 5003-7048 Time Calculation (min): 60 min  Short Term Goals: Focus on LTGs  Skilled Therapeutic Interventions/Progress Updates:  Pt seen for ADL retraining with focus on sit<>stand, dynamic standing balance, and functional transfers. Pt received supine in bed stating "I did my arm exercises this morning." Pt's LUE much easier to provide PROM this AM. Pt declining shower and requesting to complete bathing EOB. Pt with good carryover of crossover technique for washing lower BLEs requiring assist for positioning of LLE. Completed all sit<>stand with min assist and only 1 cue for hand placement. Focused on controlled descent during stand>sit. Pt with min assist for standing balance as she completed buttocks hygiene for first time. Utilized stool for threading BLE into undergarments and pants requiring min assist for managing completely over LLE. Pt assisting with pulling up pants over R hip and required assist to manage over L hip. Pt setup w/c prior to transfer at Mod I level then completed scoot transfer to L with min assist to block LLE. At end of session pt left sitting in w/c with all needs in reach. Discussed family education tomorrow with son and pt reporting he will not be assisting with bathing and dressing, only her CNA and daughter will. Plan to complete family education with them as well.   Therapy Documentation Precautions:  Precautions Precautions: Fall;ICD/Pacemaker Precaution Comments: pacemaker; chronic back pain Restrictions Weight Bearing Restrictions: No General:   Vital Signs: Therapy Vitals Temp: 97.3 F (36.3 C) Temp src: Oral Pulse Rate: 79 Resp: 17 BP: 120/80 mmHg Oxygen Therapy SpO2: 96 % O2 Device: None (Room air) Pain: No c/o pain during therapy session.   See FIM for current functional status  Therapy/Group:  Individual Therapy  Lorrin Nawrot N 01/31/2014, 9:30 AM

## 2014-01-31 NOTE — Progress Notes (Signed)
Recreational Therapy Session Note  Patient Details  Name: [name] MRN: [id number] Date of Birth: [birth year] Today's Date: 01/31/2014  Pain: no c/o  Skilled Therapeutic Interventions/Progress Updates: Session focused on activity tolerance & w/c mobility.  Pt propelled w/c throughout rehabilitation center using R UE/LE with mod I.  Pt created grocery list for cooking activity planned for next week with independence.   Belinda Bringhurst 01/31/2014, 2:02 PM

## 2014-01-31 NOTE — Progress Notes (Signed)
Physical Therapy Weekly Progress Note  Patient Details  Name: [name] MRN: [id number] Date of Birth: 01-Mar-1966  Today's Date: 01/31/2014 Time: 2637-8588 Time Calculation (min): 59 min  Patient has met 4 of 4 short term goals.  Pt demonstrates increased stability/independence with bed mobility, functional transfers, w/c mobility, and gait.   Patient continues to demonstrate the following deficits: L hemiplegia, impaired timing and sequencing, unbalanced muscle activation, decreased sitting balance, decreased standing balance, decreased postural control, hemiplegia and decreased balance strategies and therefore will continue to benefit from skilled PT intervention to enhance overall performance with activity tolerance, balance, postural control, ability to compensate for deficits and functional use of  left upper extremity and left lower extremity.  Patient progressing toward long term goals.[name]  long term goals addressing w/c mobility downgraded secondary to limited independence with ramp negotiation; goal addressing gait downgraded due to pt progressing more slowly than initially predicted; goal for bed mobility downgraded due to continued need for assist with LLE management.  PT Short Term Goals Week 2:  PT Short Term Goal 1 (Week 2): Pt will consistently perform sit<>supine with mod A using bed rail with HOB flat. PT Short Term Goal 1 - Progress (Week 2): Met PT Short Term Goal 2 (Week 2): Pt will consistently perform  bed<>chair transfer with mod A. PT Short Term Goal 2 - Progress (Week 2): Met PT Short Term Goal 3 (Week 2): Pt will perform w/c mobility in controlled environment x150' with supervision and no rest breaks. PT Short Term Goal 3 - Progress (Week 2): Met PT Short Term Goal 4 (Week 2): Pt will perform static standing with single UE support x1 minute with min A. PT Short Term Goal 4 - Progress (Week 2): Met PT Short Term Goal 5 (Week 2): Pt will perform gait x10' in  controlled environment with total A of single therapist. PT Short Term Goal 5 - Progress (Week 2): Met Week 3:  PT Short Term Goal 1 (Week 3): STG's=LTG's secondary to ELOS  Skilled Therapeutic Interventions/Progress Updates:    Co-treatment with rec therapist. Pt received seated in w/c; agreeable to therapy. Session focused on increasing pt stability/independence with car transfer, furniture transfer. Pt wearing blue rocker AFO on L LE throughout session to trial effectiveness of AFO in providing stability with functional transfers. Pt performed w/c mobility 2x150' (to/from pt room) in controlled and home environments with supervision. Car transfer (scoot pivot) with leg lifter and min A for LLE management, increased time, tactile cueing at L knee to promote LLE weightbearing. Scoot pivot from w/c>couch (rehab apartment); squat pivot from couch>w/c with min A, tactile cueing as described. Pt effectively managed w/c arm rest, brakes, and w/c leg rests with supervision/min cueing. Pt required min A, min cueing for safe/proper setup of transfers. Returned to pt room, where pt performed squat pivot transfer from w/c>bed (elevated height) with min A, min manual stabilization of LLE, mod tactile cueing for anterior weight shift. Performed sit>supine with leg lifter, increased time, min A for LLE management.  Scooting to [facility] with mod A for LLE management. Therapist departed with pt semi-reclined in bed with 3 bed rails up, bed alarm on, and all needs within reach.  Therapy Documentation Precautions:  Precautions Precautions: Fall;ICD/Pacemaker Precaution Comments: pacemaker; chronic back pain Restrictions Weight Bearing Restrictions: No Vital Signs: Therapy Vitals Temp: 97.9 F (36.6 C) Temp src: Oral Pulse Rate: 87 Resp: 18 BP: 130/82 mmHg Patient Position, if appropriate: Lying Oxygen Therapy SpO2: 99 %  O2 Device: None (Room air) Pain:  Pt reports no pain during session. Locomotion  : Wheelchair Mobility Distance: 150   See FIM for current functional status  Therapy/Group: Co-Treatment  Hobble, Blair A 01/31/2014, 5:24 PM

## 2014-01-31 NOTE — Progress Notes (Signed)
Speech Language Pathology Daily Session Note  Patient Details  Name: [name] MRN: [id number] Date of Birth: Jul 31, 1966  Today's Date: 01/31/2014 Time: 1140-1230 Time Calculation (min): 50 min  Short Term Goals: Week 3: SLP Short Term Goal 1 (Week 3): Pt will utilize safe swallowing strategies during meal consisting of regular textures and thin liquids with Mod I SLP Short Term Goal 2 (Week 3): Pt will demonstrate functional problem solving for complex tasks with supervision verbal cues.  SLP Short Term Goal 3 (Week 3): Pt will utilize speech intelligibility strategies at the sentence level with Mod I.  SLP Short Term Goal 4 (Week 3): Pt will perfrom lingual and labial oral-motor exercises with Min A verbal and visual cues.   Skilled Therapeutic Interventions: Skilled treatment focused on speech, swallowing, and cognitive goals. SLP facilitated session with Min cues for completion of oral motor exercises. Pt requested external memory aid and utilized it with Mod I. Pt verbalized her need to use the bathroom with Mod I, and followed instructions throughout transfer and toileting with Mod I. She demonstrated anticipatory awareness and safety awareness with Mod I. Pt was observed during beginning of lunch meal, consisting of regular textures and thin liquids with no overt s/s of aspiration and adequate oral clearance. Pt verbalized her safe swallowing strategies with Mod I. Continue plan of care with pt remaining on regular textures.   FIM:  Comprehension Comprehension Mode: Auditory Comprehension: 5-Understands complex 90% of the time/Cues < 10% of the time Expression Expression Mode: Verbal Expression: 5-Expresses complex 90% of the time/cues < 10% of the time Social Interaction Social Interaction: 6-Interacts appropriately with others with medication or extra time (anti-anxiety, antidepressant). Problem Solving Problem Solving: 5-Solves basic problems: With no  assist Memory Memory: 6-More than reasonable amt of time FIM - Eating Eating Activity: 5: Set-up assist for open containers;5: Set-up assist for cut food;6: Swallowing techniques: self-managed  Pain Pain Assessment Pain Assessment: No/denies pain  Therapy/Group: Individual Therapy    Maxcine Ham, M.A. CCC-SLP [phone]  Maxcine Ham 01/31/2014, 2:04 PM

## 2014-01-31 NOTE — Progress Notes (Signed)
Recreational Therapy Session Note  Patient Details  Name: [name] MRN: [id number] Date of Birth: [birth year] Today's Date: 01/31/2014  Pain: no c/o Skilled Therapeutic Interventions/Progress Updates: Session focused on activity tolerance, w/c mobility, functional transfers, discharge planning in regards to leisure time & activities for participation with grandkids & meal planning.  Pt propelled w/c on unit with supervision with occasional min cues for obstacle negotiation.  Pt performed scoot/squat pivot transfers with min verbal cues & min assist.  Meal planning with pt with plan to prepare simple meal early next week.   Pietro Bonura 01/31/2014, 11:42 AM

## 2014-01-31 NOTE — Progress Notes (Signed)
Results for DAVION, MEARA (MRN [id number]) as of 01/31/2014 12:54  Ref. Range 01/30/2014 11:46 01/30/2014 16:49 01/30/2014 21:38 01/31/2014 07:16 01/31/2014 11:51  Glucose-Capillary Latest Range: 70-99 mg/dL 109 (H) 323 (H) 557 (H) 237 (H) 223 (H)   CBGs continue to be greater than 180 mg/dl.  Consider increasing Lantus to 25 units daily if CBGs continue to be elevated. Continue Novolog SENSITIVE correction scale TID.  Smith Mince RN BSN CDE

## 2014-01-31 NOTE — Progress Notes (Signed)
Occupational Therapy Session Note  Patient Details  Name: [name] MRN: [id number] Date of Birth: May 04, 1966  Today's Date: 01/31/2014 Time: 0037-0488 Time Calculation (min): 30 min  Short Term Goals: Week 3:  OT Short Term Goal 1 (Week 3): Focus on LTGs   Skilled Therapeutic Interventions/Progress Updates:  Patient in w/c upon arrival.  Engaged in w/c>bed transfers, LUE ROM and weight bearing and bed positioning.  Patient elected to finish session in her bed and decided to transfer to her weak left side.  Patient did not maintain forward weight shift to lift her bottom and sat too early therefore required max assist to complete squat pivot transfer.  Patient with poor safety awareness which can make transfers unsafe.  While sitting EOB, performed LUE PROM and SROM exercises to include forward weight shifts while seated with hands clasped and arms straight.  Patient reached forward then to the floor 5 times before report that her stomach hurt from shots to her belly 3 times a day > therefore this exercise was discontinued.  Performed LUE weight bearing through hand and extended elbow as well as through elbow/forearm in sidesitting.  Assisted patient into bed with bed alarm on and all items in reach.  Therapy Documentation Precautions:  Precautions Precautions: Fall;ICD/Pacemaker Precaution Comments: pacemaker; chronic back pain Restrictions Weight Bearing Restrictions: No Pain: No report of pain  Therapy/Group: Individual Therapy  Airica Schwartzkopf 01/31/2014, 3:48 PM

## 2014-01-31 NOTE — Progress Notes (Signed)
Nursing Note: Pt up to the bathroom to void and unable to.Pt felt full.Pt in and out cathed for 200 cc.Pt tolerated well.wbb

## 2014-01-31 NOTE — Progress Notes (Signed)
Subjective/Complaints: 48 y.o. right-handed female with history of uncontrolled hypertension, diabetes mellitus peripheral neuropathy, morbid obesity, nonischemic cardiomyopathy, chronic diastolic heart failure, CAD status post pacemaker placement and chronic back pain followed at outpatient rehabilitation services. Admitted 01/09/2014 with left facial weakness and slurred speech. Patient with previous admission 10/07/2013 with similar symptoms workup negative as per neurology services. Cranial CT scan consistent with possible right middle cerebral artery territory infarct. MRI not completed secondary to pacemaker. Patient did not receive TPA. CTA view of the head negative for hemorrhage did show occluded branch MCA vessel. CTA of the neck was negative.. Echocardiogram with ejection fraction of 40% no wall motion abnormalities.. TEE showed moderately depressed LVEF without thrombus or vegetation. . Currently maintained on aspirin for CVA prophylaxis. Subcutaneous heparin for DVT prophylaxis. Diet has been advanced to a regular consistency  No BM for a couple days, still voiding but has increased freq  Review of Systems - Negative except back pain Objective: Vital Signs: Blood pressure 120/80, pulse 79, temperature 97.3 F (36.3 C), temperature source Oral, resp. rate 17, height 5\' 9"  (1.753 m), weight 120.6 kg (265 lb 14 oz), last menstrual period 12/21/2013, SpO2 96.00%. No results found. Results for orders placed during the hospital encounter of 01/15/14 (from the past 72 hour(s))  GLUCOSE, CAPILLARY     Status: Abnormal   Collection Time    01/28/14  7:21 AM      Result Value Ref Range   Glucose-Capillary 248 (*) 70 - 99 mg/dL   Comment 1 Notify RN    GLUCOSE, CAPILLARY     Status: Abnormal   Collection Time    01/28/14 11:31 AM      Result Value Ref Range   Glucose-Capillary 287 (*) 70 - 99 mg/dL   Comment 1 Notify RN    GLUCOSE, CAPILLARY     Status: Abnormal   Collection Time     01/28/14  4:38 PM      Result Value Ref Range   Glucose-Capillary 204 (*) 70 - 99 mg/dL  GLUCOSE, CAPILLARY     Status: Abnormal   Collection Time    01/28/14  8:51 PM      Result Value Ref Range   Glucose-Capillary 227 (*) 70 - 99 mg/dL  URINALYSIS, ROUTINE W REFLEX MICROSCOPIC     Status: Abnormal   Collection Time    01/29/14  5:33 AM      Result Value Ref Range   Color, Urine YELLOW  YELLOW   APPearance CLOUDY (*) CLEAR   Specific Gravity, Urine 1.030  1.005 - 1.030   pH 5.0  5.0 - 8.0   Glucose, UA >1000 (*) NEGATIVE mg/dL   Hgb urine dipstick NEGATIVE  NEGATIVE   Bilirubin Urine NEGATIVE  NEGATIVE   Ketones, ur NEGATIVE  NEGATIVE mg/dL   Protein, ur NEGATIVE  NEGATIVE mg/dL   Urobilinogen, UA 0.2  0.0 - 1.0 mg/dL   Nitrite NEGATIVE  NEGATIVE   Leukocytes, UA SMALL (*) NEGATIVE  URINE CULTURE     Status: None   Collection Time    01/29/14  5:33 AM      Result Value Ref Range   Specimen Description URINE, RANDOM     Special Requests NONE     Culture  Setup Time       Value: 01/29/2014 06:02     Performed at [facility]   Colony Count       Value: >=100,000 COLONIES/ML     [facility]formed at [facility]   Culture       Value: ESCHERICHIA COLI     Performed at [facility]   Report Status 01/30/2014 FINAL     Organism ID, Bacteria ESCHERICHIA COLI    URINE MICROSCOPIC-ADD ON     Status: Abnormal   Collection Time    01/29/14  5:33 AM      Result Value Ref Range   Squamous Epithelial / LPF RARE  RARE   WBC, UA 11-20  <3 WBC/hpf   RBC / HPF 0-2  <3 RBC/hpf   Bacteria, UA MANY (*) RARE   Casts HYALINE CASTS (*) NEGATIVE  GLUCOSE, CAPILLARY     Status: Abnormal   Collection Time    01/29/14  7:22 AM      Result Value Ref Range   Glucose-Capillary 275 (*) 70 - 99 mg/dL   Comment 1 Notify RN    GLUCOSE, CAPILLARY     Status: Abnormal   Collection Time    01/29/14 12:01 PM      Result Value Ref Range   Glucose-Capillary 226 (*) 70 - 99 mg/dL    Comment 1 Notify RN    GLUCOSE, CAPILLARY     Status: Abnormal   Collection Time    01/29/14  4:34 PM      Result Value Ref Range   Glucose-Capillary 176 (*) 70 - 99 mg/dL   Comment 1 Notify RN    GLUCOSE, CAPILLARY     Status: Abnormal   Collection Time    01/29/14  9:33 PM      Result Value Ref Range   Glucose-Capillary 210 (*) 70 - 99 mg/dL  GLUCOSE, CAPILLARY     Status: Abnormal   Collection Time    01/30/14  7:22 AM      Result Value Ref Range   Glucose-Capillary 207 (*) 70 - 99 mg/dL   Comment 1 Notify RN    GLUCOSE, CAPILLARY     Status: Abnormal   Collection Time    01/30/14 11:46 AM      Result Value Ref Range   Glucose-Capillary 279 (*) 70 - 99 mg/dL   Comment 1 Notify RN       HEENT: normal tongue Cardio: RRR and no murmur Resp: CTA B/L and unlabored GI: BS positive and non distended Extremity:  Pulses positive and No Edema Skin:   Intact Neuro: Alert/Oriented, Abnormal Sensory absent to LT LUE and intact LLE, Abnormal Motor 0/5 in LUE and LLE and Other no evidence of L field cut Musc/Skel:  Other back pain  Gen NAD   Assessment/Plan: 1. Functional deficits secondary to Cardioembolic R MCA infarct which require 3+ hours per day of interdisciplinary therapy in a comprehensive inpatient rehab setting. Physiatrist is providing close team supervision and 24 hour management of active medical problems listed below. Physiatrist and rehab team continue to assess barriers to discharge/monitor patient progress toward functional and medical goals. FIM: FIM - Bathing Bathing Steps Patient Completed: Chest;Left Arm;Abdomen;Right upper leg;Left upper leg;Right lower leg (including foot);Front perineal area Bathing: 3: Mod-Patient completes 5-7 13f 10 parts or 50-74%  FIM - Upper Body Dressing/Undressing Upper body dressing/undressing steps patient completed: Thread/unthread right sleeve of pullover shirt/dresss;Put head through opening of pull over shirt/dress;Pull  shirt over trunk;Thread/unthread left sleeve of pullover shirt/dress Upper body dressing/undressing: 5: Set-up assist to: Obtain clothing/put away FIM - Lower Body Dressing/Undressing Lower body dressing/undressing steps patient completed: Thread/unthread right underwear leg;Thread/unthread right pants leg;Don/Doff right sock;Don/Doff right  shoe;Fasten/unfasten right shoe;Fasten/unfasten left shoe Lower body dressing/undressing: 3: Mod-Patient completed 50-74% of tasks  FIM - Toileting Toileting steps completed by patient:  (was wearing hospital gown ) Toileting Assistive Devices: Grab bar or rail for support Toileting: 1: Two helpers  FIM - Diplomatic Services operational officer Devices: Grab bars;Walker Toilet Transfers: 3-To toilet/BSC: Mod A (lift or lower assist);3-From toilet/BSC: Mod A (lift or lower assist)  FIM - Bed/Chair Transfer Bed/Chair Transfer Assistive Devices: Arm rests;Bed rails (leg lifter) Bed/Chair Transfer: 3: Bed > Chair or W/C: Mod A (lift or lower assist);3: Chair or W/C > Bed: Mod A (lift or lower assist);5: Supine > Sit: Supervision (verbal cues/safety issues);4: Sit > Supine: Min A (steadying pt. > 75%/lift 1 leg)  FIM - Locomotion: Wheelchair Distance: 150 Locomotion: Wheelchair: 5: Travels 150 ft or more: maneuvers on rugs and over door sills with supervision, cueing or coaxing FIM - Locomotion: Ambulation Locomotion: Ambulation Assistive Devices: Other (comment);Walker - Rolling (L hand orthosis, L AFO (blue rocker)) Ambulation/Gait Assistance: 1: +2 Total assist Locomotion: Ambulation: 1: Two helpers  Comprehension Comprehension Mode: Auditory Comprehension: 5-Understands complex 90% of the time/Cues < 10% of the time  Expression Expression Mode: Verbal Expression: 5-Expresses complex 90% of the time/cues < 10% of the time  Social Interaction Social Interaction: 6-Interacts appropriately with others with medication or extra time (anti-anxiety,  antidepressant).  Problem Solving Problem Solving: 5-Solves basic 90% of the time/requires cueing < 10% of the time  Memory Memory: 6-More than reasonable amt of time  Medical Problem List and Plan:  1. Cardioembolic right MCA infarct, left hemiparesis and LUE sensory def  2. DVT Prophylaxis/Anticoagulation: Subcutaneous heparin. Monitor platelet counts and any signs of bleeding  3. Pain Management/chronic pain syndrome: Tegretol 200 mg twice a day for neuropathic pain. Patient also on Flexeril prior to admission but held due to some lethargy.  4. Neuropsych: This patient is capable of making decisions on her own behalf.  5. Nonischemic cardiomyopathy, CAD with pacemaker, placed Sept 2014. No chest pain or shortness of breath  6. Diabetes mellitus with peripheral neuropathy. UncontrolledHemoglobin A1c 10.7.Increase Lantus insulin 35 units each bedtime.titrate up Check blood sugars a.c. and at bedtime  7. Hypertension. Toprol-XL 25 mg twice a day, Lanoxin 0.125 mg daily. Monitor with increased activity.8. Chronic diastolic heart failure. Monitor for any signs of fluid overload. Daily i's and o's and weights. Patient on Lasix 40 mg twice a day prior to admission. Consider resuming back as needed monitor fluid intake 9. Hyperlipidemia. Lipitor 10.  Spasticity increase zanaflex at noc helpful 11.  Urinary retention- recurrent after reducing Urecholine, resume 25mg  dose , and  Flomax,UA +, add keflex  LOS (Days) 16 A FACE TO FACE EVALUATION WAS PERFORMED  01/31/2014, 6:27 AM   Subjective/Complaints: 48 y.o. right-handed female with history of uncontrolled hypertension, diabetes mellitus peripheral neuropathy, morbid obesity, nonischemic cardiomyopathy, chronic diastolic heart failure, CAD status post pacemaker placement and chronic back pain followed at outpatient rehabilitation services. Admitted 01/09/2014 with left facial weakness and slurred speech. Patient with previous  admission 10/07/2013 with similar symptoms workup negative as per neurology services. Cranial CT scan consistent with possible right middle cerebral artery territory infarct. MRI not completed secondary to pacemaker. Patient did not receive TPA. CTA view of the head negative for hemorrhage did show occluded branch MCA vessel. CTA of the neck was negative.. Echocardiogram with ejection fraction of 40% no wall motion abnormalities.. TEE showed moderately depressed LVEF without thrombus or vegetation. 10/09/2013  Currently maintained on aspirin for CVA prophylaxis. Subcutaneous heparin for DVT prophylaxis. Diet has been advanced to a regular consistency  Voiding during the day, cath at noc  Review of Systems - Negative except back pain Objective: Vital Signs: Blood pressure 120/80, pulse 79, temperature 97.3 F (36.3 C), temperature source Oral, resp. rate 17, height 5\' 9"  (1.753 m), weight 120.6 kg (265 lb 14 oz), last menstrual period 12/21/2013, SpO2 96.00%. No results found. Results for orders placed during the hospital encounter of 01/15/14 (from the past 72 hour(s))  GLUCOSE, CAPILLARY     Status: Abnormal   Collection Time    01/28/14  7:21 AM      Result Value Ref Range   Glucose-Capillary 248 (*) 70 - 99 mg/dL   Comment 1 Notify RN    GLUCOSE, CAPILLARY     Status: Abnormal   Collection Time    01/28/14 11:31 AM      Result Value Ref Range   Glucose-Capillary 287 (*) 70 - 99 mg/dL   Comment 1 Notify RN    GLUCOSE, CAPILLARY     Status: Abnormal   Collection Time    01/28/14  4:38 PM      Result Value Ref Range   Glucose-Capillary 204 (*) 70 - 99 mg/dL  GLUCOSE, CAPILLARY     Status: Abnormal   Collection Time    01/28/14  8:51 PM      Result Value Ref Range   Glucose-Capillary 227 (*) 70 - 99 mg/dL  URINALYSIS, ROUTINE W REFLEX MICROSCOPIC     Status: Abnormal   Collection Time    01/29/14  5:33 AM      Result Value Ref Range   Color, Urine YELLOW  YELLOW   APPearance CLOUDY (*)  CLEAR   Specific Gravity, Urine 1.030  1.005 - 1.030   pH 5.0  5.0 - 8.0   Glucose, UA >1000 (*) NEGATIVE mg/dL   Hgb urine dipstick NEGATIVE  NEGATIVE   Bilirubin Urine NEGATIVE  NEGATIVE   Ketones, ur NEGATIVE  NEGATIVE mg/dL   Protein, ur NEGATIVE  NEGATIVE mg/dL   Urobilinogen, UA 0.2  0.0 - 1.0 mg/dL   Nitrite NEGATIVE  NEGATIVE   Leukocytes, UA SMALL (*) NEGATIVE  URINE CULTURE     Status: None   Collection Time    01/29/14  5:33 AM      Result Value Ref Range   Specimen Description URINE, RANDOM     Special Requests NONE     Culture  Setup Time       Value: 01/29/2014 06:02     Performed at 01/31/2014 Count       Value: >=100,000 COLONIES/ML     Performed at [facility]   Culture       Value: ESCHERICHIA COLI     Performed at [facility]   Report Status 01/30/2014 FINAL     Organism ID, Bacteria ESCHERICHIA COLI    URINE MICROSCOPIC-ADD ON     Status: Abnormal   Collection Time    01/29/14  5:33 AM      Result Value Ref Range   Squamous Epithelial / LPF RARE  RARE   WBC, UA 11-20  <3 WBC/hpf   RBC / HPF 0-2  <3 RBC/hpf   Bacteria, UA MANY (*) RARE   Casts HYALINE CASTS (*) NEGATIVE  GLUCOSE, CAPILLARY     Status: Abnormal   Collection Time    01/29/14  7:22 AM      Result Value Ref Range   Glucose-Capillary 275 (*) 70 - 99 mg/dL   Comment 1 Notify RN    GLUCOSE, CAPILLARY     Status: Abnormal   Collection Time    01/29/14 12:01 PM      Result Value Ref Range   Glucose-Capillary 226 (*) 70 - 99 mg/dL   Comment 1 Notify RN    GLUCOSE, CAPILLARY     Status: Abnormal   Collection Time    01/29/14  4:34 PM      Result Value Ref Range   Glucose-Capillary 176 (*) 70 - 99 mg/dL   Comment 1 Notify RN    GLUCOSE, CAPILLARY     Status: Abnormal   Collection Time    01/29/14  9:33 PM      Result Value Ref Range   Glucose-Capillary 210 (*) 70 - 99 mg/dL  GLUCOSE, CAPILLARY     Status: Abnormal   Collection Time    01/30/14   7:22 AM      Result Value Ref Range   Glucose-Capillary 207 (*) 70 - 99 mg/dL   Comment 1 Notify RN    GLUCOSE, CAPILLARY     Status: Abnormal   Collection Time    01/30/14 11:46 AM      Result Value Ref Range   Glucose-Capillary 279 (*) 70 - 99 mg/dL   Comment 1 Notify RN       HEENT: normal tongue Cardio: RRR and no murmur Resp: CTA B/L and unlabored GI: BS positive and non distended Extremity:  Pulses positive and No Edema Skin:   Intact Neuro: Alert/Oriented, Abnormal Sensory absent to LT LUE and intact LLE, Abnormal Motor 0/5 in LUE and LLE and Other no evidence of L field cut Musc/Skel:  Other back pain  Gen NAD   Assessment/Plan: 1. Functional deficits secondary to Cardioembolic R MCA infarct which require 3+ hours per day of interdisciplinary therapy in a comprehensive inpatient rehab setting. Physiatrist is providing close team supervision and 24 hour management of active medical problems listed below. Physiatrist and rehab team continue to assess barriers to discharge/monitor patient progress toward functional and medical goals. FIM: FIM - Bathing Bathing Steps Patient Completed: Chest;Left Arm;Abdomen;Right upper leg;Left upper leg;Right lower leg (including foot);Front perineal area Bathing: 3: Mod-Patient completes 5-7 60f 10 parts or 50-74%  FIM - Upper Body Dressing/Undressing Upper body dressing/undressing steps patient completed: Thread/unthread right sleeve of pullover shirt/dresss;Put head through opening of pull over shirt/dress;Pull shirt over trunk;Thread/unthread left sleeve of pullover shirt/dress Upper body dressing/undressing: 5: Set-up assist to: Obtain clothing/put away FIM - Lower Body Dressing/Undressing Lower body dressing/undressing steps patient completed: Thread/unthread right underwear leg;Thread/unthread right pants leg;Don/Doff right sock;Don/Doff right shoe;Fasten/unfasten right shoe;Fasten/unfasten left shoe Lower body dressing/undressing: 3:  Mod-Patient completed 50-74% of tasks  FIM - Toileting Toileting steps completed by patient:  (was wearing hospital gown ) Toileting Assistive Devices: Grab bar or rail for support Toileting: 1: Two helpers  FIM - Diplomatic Services operational officer Devices: Grab bars;Walker Toilet Transfers: 3-To toilet/BSC: Mod A (lift or lower assist);3-From toilet/BSC: Mod A (lift or lower assist)  FIM - Bed/Chair Transfer Bed/Chair Transfer Assistive Devices: Arm rests;Bed rails (leg lifter) Bed/Chair Transfer: 3: Bed > Chair or W/C: Mod A (lift or lower assist);3: Chair or W/C > Bed: Mod A (lift or lower assist);5: Supine > Sit: Supervision (verbal cues/safety issues);4: Sit > Supine: Min A (steadying pt. > 75%/lift 1 leg)  FIM - Locomotion: Wheelchair Distance: 150 Locomotion: Wheelchair: 5: Travels 150 ft or more: maneuvers on rugs and over door sills with supervision, cueing or coaxing FIM - Locomotion: Ambulation Locomotion: Ambulation Assistive Devices: Other (comment);Walker - Rolling (L hand orthosis, L AFO (blue rocker)) Ambulation/Gait Assistance: 1: +2 Total assist Locomotion: Ambulation: 1: Two helpers  Comprehension Comprehension Mode: Auditory Comprehension: 5-Understands complex 90% of the time/Cues < 10% of the time  Expression Expression Mode: Verbal Expression: 5-Expresses complex 90% of the time/cues < 10% of the time  Social Interaction Social Interaction: 6-Interacts appropriately with others with medication or extra time (anti-anxiety, antidepressant).  Problem Solving Problem Solving: 5-Solves basic 90% of the time/requires cueing < 10% of the time  Memory Memory: 6-More than reasonable amt of time  Medical Problem List and Plan:  1. Cardioembolic right MCA infarct, left hemiparesis and LUE sensory def  2. DVT Prophylaxis/Anticoagulation: Subcutaneous heparin. Monitor platelet counts and any signs of bleeding  3. Pain Management/chronic pain syndrome:  Tegretol 200 mg twice a day for neuropathic pain. Patient also on Flexeril prior to admission but held due to some lethargy.  4. Neuropsych: This patient is capable of making decisions on her own behalf.  5. Nonischemic cardiomyopathy, CAD with pacemaker, placed Sept 2014. No chest pain or shortness of breath  6. Diabetes mellitus with peripheral neuropathy. UncontrolledHemoglobin A1c 10.7.Increase Lantus insulin 35 units each bedtime.titrate up Check blood sugars a.c. and at bedtime  7. Hypertension. Toprol-XL 25 mg twice a day, Lanoxin 0.125 mg daily. Monitor with increased activity.8. Chronic diastolic heart failure. Monitor for any signs of fluid overload. Daily i's and o's and weights. Patient on Lasix 40 mg twice a day prior to admission. Consider resuming back as needed monitor fluid intake 9. Hyperlipidemia. Lipitor 10.  Spasticity increase zanaflex at noc helpful 11.  Urinary retention- improving on  Urecholine increase to QID, and  Flomax, also check UA secondary to freq and hx of recurrent UTI  LOS (Days) 16 A FACE TO FACE EVALUATION WAS PERFORMED  KIRSTEINS,ANDREW E 01/31/2014, 6:27 AM

## 2014-02-01 ENCOUNTER — Inpatient Hospital Stay (HOSPITAL_COMMUNITY): Payer: Medicaid Other | Admitting: Physical Therapy

## 2014-02-01 ENCOUNTER — Inpatient Hospital Stay (HOSPITAL_COMMUNITY): Payer: Medicaid Other | Admitting: Speech Pathology

## 2014-02-01 ENCOUNTER — Encounter (HOSPITAL_COMMUNITY): Payer: Medicaid Other

## 2014-02-01 DIAGNOSIS — I5032 Chronic diastolic (congestive) heart failure: Secondary | ICD-10-CM

## 2014-02-01 DIAGNOSIS — I6340 Cerebral infarction due to embolism of unspecified cerebral artery: Secondary | ICD-10-CM

## 2014-02-01 DIAGNOSIS — G8110 Spastic hemiplegia affecting unspecified side: Secondary | ICD-10-CM

## 2014-02-01 DIAGNOSIS — I428 Other cardiomyopathies: Secondary | ICD-10-CM

## 2014-02-01 DIAGNOSIS — I10 Essential (primary) hypertension: Secondary | ICD-10-CM

## 2014-02-01 DIAGNOSIS — E1142 Type 2 diabetes mellitus with diabetic polyneuropathy: Secondary | ICD-10-CM

## 2014-02-01 DIAGNOSIS — E1149 Type 2 diabetes mellitus with other diabetic neurological complication: Secondary | ICD-10-CM

## 2014-02-01 LAB — GLUCOSE, CAPILLARY
GLUCOSE-CAPILLARY: 286 mg/dL — AB (ref 70–99)
GLUCOSE-CAPILLARY: 220 mg/dL — AB (ref 70–99)
Glucose-Capillary: 297 mg/dL — ABNORMAL HIGH (ref 70–99)
Glucose-Capillary: 250 mg/dL — ABNORMAL HIGH (ref 70–99)

## 2014-02-01 NOTE — Progress Notes (Addendum)
Physical Therapy Session Note  Patient Details  Name: [name] MRN: [id number] Date of Birth: Nov 17, 1966  Today's Date: 02/01/2014 Time: 1003-1059 and 3382-5053 Time Calculation (min): 56 min and 30 min  Short Term Goals: Week 3:  PT Short Term Goal 1 (Week 3): STG's=LTG's secondary to ELOS  Skilled Therapeutic Interventions/Progress Updates:    Treatment Session 1: Pt received seated in w/c; agreeable to therapy. Pt very upset by son's absence for planned hands-on family training. Pt's relative present for family training; however, pt reports that said relative will not be providing assistance or hands-on pt care at home. Therefore, this PT educated pt and family member that it would be necessary for this PT to provide direct, hands-on training to primary caregiver(s) only. Pt and relative verbalized understanding and were in agreement.  Due to absence of son, session focused on fitting patient for L AFO to increase stability/independence with functional transfers, standing, and gait. Orthotist, Thayer Ohm, present for assessment. Orthotist, PT, and pt engaged in interactive discussion of options for stabilizing LLE. Pt performed gait x10' in controlled environment with R hallway handrail and max A. PT positioned under pt's LUE providing R weight shift, LLE advancement, and manual block at posterior aspect of L knee to control genu recurvatum in LLE terminal stance.  After orthotist examined pt's current footwear, it was determined that an alternative pair of shoes may be sufficient for controlling L genu recurvatum during standing and gait. Therefore, pt, orthotist, and PT agreed on tentative plan for L Blue Rocker AFO to promote L ankle dorsiflexion and diabetic footwear with slight increase in heel height to control L genu recurvatum.   Pt returned to room via w/c mobility x150' in controlled environment using R hemi technique with supervision. Performed squat pivot (Bobath technique) from  w/c>bed with mod A and manual stabilization of L knee. Sit>supine with min A for LLE management. Therapist and orthotist departed with pt semi-reclined in bed with 3 bed rails up, bed alarm on and all needs within reach.  Treatment Session 2: Pt received seated in w/c; agreeable to therapy. Session focused on assessing/addressing stability with gait and functional transfers with L AFO and new shoes. Pt performed self-propulsion of w/c x180' in controlled and home (carpeted) environments using R hemi technique with supervision, increased time. Orthotist, Thayer Ohm, present with new L AFO (Blue Rocker), 3/ 8" heel lift, and diabetic footwear. Diabetic footwear more appropriate for pt due to extra wide size to accommodate LE edema, insoles to preserve skin integrity, increased depth to accommodate L AFO and heel lift, and improved structural integrity of sole to prevent L genu recurvatum. Donned diabetic shoes, L AFO, and L heel lift. Sit<>stand from w/c using R hand rail with mod A, tactile cueing at L knee to facilitate LLE weightbearing.   Performed gait x20' in controlled environment with R hallway hand rail with mod A. PT positioned under LUE providing manual facilitation of LLE advancement, min verbal cueing for R weight shift during initial 5" of gait trial. Pt reports significant increase in comfort and pt-perceived stability with gait. Orthotist and PT educated pt on correct technique for donning/doffing AFO, how to inspect for correct fit, importance of inspecting feet frequently for signs of pressure or skin breakdown secondary to diabetes. Pt verbalized understanding. Session ended in pt room, where pt was left seated in w/c with all needs within reach.  Therapy Documentation Precautions:  Precautions Precautions: Fall;ICD/Pacemaker Precaution Comments: pacemaker; chronic back pain Restrictions Weight Bearing Restrictions:  No (block lf. knee, max queing) Pain: Pain Assessment Pain Assessment:  0-10 Pain Score: 8  Pain Type: Acute pain Pain Location: Back Pain Orientation: Upper Pain Descriptors / Indicators: Aching Pain Frequency: Intermittent Pain Onset: Gradual Patients Stated Pain Goal: 3 Pain Intervention(s): RN made aware;Other (Comment);Ambulation/increased activity (RN aware prior to session) Multiple Pain Sites: No Locomotion : Ambulation Ambulation/Gait Assistance: 2: Max Lawyer Distance: 150   See FIM for current functional status  Therapy/Group: Individual Therapy  Tyde Lamison, Lorenda Ishihara 02/01/2014, 12:24 PM

## 2014-02-01 NOTE — Progress Notes (Signed)
Social Work Patient ID: [name], female   DOB: 27-Jan-1966, 48 y.o.   MRN: [id number] Met with pt and son when finally showed up, two hours late.  He missed therapy and does not plan to stay this afternoon for her therapies. He states; " My sister is coming on Monday, show her."  Have also spoken with Otelia Santee here PCS aide and he can come in Monday at 3;00pm Will schedule daughter in am at 10-12;00 and have her other PT session at 3;00 for PCS aide to see her care.  Discussed again with pt the recommendation of 24 hr care And who will be providing this.  Have placed on [employer] waiting list but reported a year long.  She already receives 3 hours per day and this is all she is eligible for, until she Comes up on waiting list.  Daughter works daytime and son is there but unsure if he will assist her.  Will discuss again with pt the options and also with daughter on Monday. Concerned about pt's discharge plan and care once goes home.

## 2014-02-01 NOTE — Progress Notes (Addendum)
Subjective/Complaints: Slept well, no caths last noc!  Review of Systems - Negative except Left weakness, chronic back pain  Objective: Vital Signs: Blood pressure 104/62, pulse 81, temperature 97.7 F (36.5 C), temperature source Oral, resp. rate 19, height 5\' 9"  (1.753 m), weight 120.6 kg (265 lb 14 oz), last menstrual period 12/21/2013, SpO2 100.00%. No results found. Results for orders placed during the hospital encounter of 01/15/14 (from the past 72 hour(s))  GLUCOSE, CAPILLARY     Status: Abnormal   Collection Time    01/29/14  7:22 AM      Result Value Ref Range   Glucose-Capillary 275 (*) 70 - 99 mg/dL   Comment 1 Notify RN    GLUCOSE, CAPILLARY     Status: Abnormal   Collection Time    01/29/14 12:01 PM      Result Value Ref Range   Glucose-Capillary 226 (*) 70 - 99 mg/dL   Comment 1 Notify RN    GLUCOSE, CAPILLARY     Status: Abnormal   Collection Time    01/29/14  4:34 PM      Result Value Ref Range   Glucose-Capillary 176 (*) 70 - 99 mg/dL   Comment 1 Notify RN    GLUCOSE, CAPILLARY     Status: Abnormal   Collection Time    01/29/14  9:33 PM      Result Value Ref Range   Glucose-Capillary 210 (*) 70 - 99 mg/dL  GLUCOSE, CAPILLARY     Status: Abnormal   Collection Time    01/30/14  7:22 AM      Result Value Ref Range   Glucose-Capillary 207 (*) 70 - 99 mg/dL   Comment 1 Notify RN    GLUCOSE, CAPILLARY     Status: Abnormal   Collection Time    01/30/14 11:46 AM      Result Value Ref Range   Glucose-Capillary 279 (*) 70 - 99 mg/dL   Comment 1 Notify RN    GLUCOSE, CAPILLARY     Status: Abnormal   Collection Time    01/30/14  4:49 PM      Result Value Ref Range   Glucose-Capillary 299 (*) 70 - 99 mg/dL   Comment 1 Notify RN    GLUCOSE, CAPILLARY     Status: Abnormal   Collection Time    01/30/14  9:38 PM      Result Value Ref Range   Glucose-Capillary 274 (*) 70 - 99 mg/dL  GLUCOSE, CAPILLARY     Status: Abnormal   Collection Time    01/31/14  7:16  AM      Result Value Ref Range   Glucose-Capillary 237 (*) 70 - 99 mg/dL   Comment 1 Notify RN    GLUCOSE, CAPILLARY     Status: Abnormal   Collection Time    01/31/14 11:51 AM      Result Value Ref Range   Glucose-Capillary 223 (*) 70 - 99 mg/dL   Comment 1 Notify RN    GLUCOSE, CAPILLARY     Status: Abnormal   Collection Time    01/31/14  4:54 PM      Result Value Ref Range   Glucose-Capillary 249 (*) 70 - 99 mg/dL   Comment 1 Notify RN    GLUCOSE, CAPILLARY     Status: Abnormal   Collection Time    01/31/14  9:29 PM      Result Value Ref Range   Glucose-Capillary 204 (*) 70 - 99  mg/dL   Comment 1 Notify RN        General: No acute distress Mood and affect are appropriate Heart: Regular rate and rhythm no rubs murmurs or extra sounds Lungs: Clear to auscultation, breathing unlabored, no rales or wheezes Abdomen: Positive bowel sounds, soft nontender to palpation, nondistended Extremities: No clubbing, cyanosis, or edema Skin: No evidence of breakdown, no evidence of rash Neurologic: Cranial nerves II through XII intact, motor strength is 5/5 in R deltoid, bicep, tricep, grip, hip flexor, knee extensors, ankle dorsiflexor and plantar flexor, 0/5 on Left side  Musculoskeletal: Full range of motion in all 4 extremities. No joint swelling   Assessment/Plan: 1. Functional deficits secondary to Right MCA infarct which require 3+ hours per day of interdisciplinary therapy in a comprehensive inpatient rehab setting. Physiatrist is providing close team supervision and 24 hour management of active medical problems listed below. Physiatrist and rehab team continue to assess barriers to discharge/monitor patient progress toward functional and medical goals. FIM: FIM - Bathing Bathing Steps Patient Completed: Chest;Left Arm;Abdomen;Right upper leg;Left upper leg;Right lower leg (including foot);Front perineal area;Buttocks;Left lower leg (including foot) Bathing: 4: Min-Patient  completes 8-9 107f 10 parts or 75+ percent  FIM - Upper Body Dressing/Undressing Upper body dressing/undressing steps patient completed: Thread/unthread right sleeve of pullover shirt/dresss;Put head through opening of pull over shirt/dress;Pull shirt over trunk;Thread/unthread left sleeve of pullover shirt/dress Upper body dressing/undressing: 5: Set-up assist to: Obtain clothing/put away FIM - Lower Body Dressing/Undressing Lower body dressing/undressing steps patient completed: Thread/unthread right underwear leg;Thread/unthread right pants leg;Don/Doff right sock;Don/Doff right shoe;Fasten/unfasten right shoe;Fasten/unfasten left shoe Lower body dressing/undressing: 3: Mod-Patient completed 50-74% of tasks  FIM - Toileting Toileting steps completed by patient:  (was wearing hospital gown ) Toileting Assistive Devices: Grab bar or rail for support Toileting: 1: Two helpers  FIM - Diplomatic Services operational officer Devices: Grab bars;Walker Toilet Transfers: 3-To toilet/BSC: Mod A (lift or lower assist);3-From toilet/BSC: Mod A (lift or lower assist)  FIM - Bed/Chair Transfer Bed/Chair Transfer Assistive Devices: Arm rests (leg lifter) Bed/Chair Transfer: 4: Bed > Chair or W/C: Min A (steadying Pt. > 75%);4: Chair or W/C > Bed: Min A (steadying Pt. > 75%);4: Sit > Supine: Min A (steadying pt. > 75%/lift 1 leg)  FIM - Locomotion: Wheelchair Distance: 150 Locomotion: Wheelchair: 5: Travels 150 ft or more: maneuvers on rugs and over door sills with supervision, cueing or coaxing FIM - Locomotion: Ambulation Locomotion: Ambulation Assistive Devices: Other (comment);Walker - Rolling (L hand orthosis, L AFO (blue rocker)) Ambulation/Gait Assistance: 1: +2 Total assist Locomotion: Ambulation: 0: Activity did not occur  Comprehension Comprehension Mode: Auditory Comprehension: 5-Understands basic 90% of the time/requires cueing < 10% of the time  Expression Expression Mode:  Verbal Expression: 5-Expresses complex 90% of the time/cues < 10% of the time  Social Interaction Social Interaction: 6-Interacts appropriately with others with medication or extra time (anti-anxiety, antidepressant).  Problem Solving Problem Solving: 5-Solves basic problems: With no assist  Memory Memory: 6-More than reasonable amt of time  Medical problem list 1.  CVA prophyllaxis ASA 325mg  Qd 2.  DVT prophyllaxis Heparin 5000U sq TID 3. CHF compensatedDigoxin, Lasix, toprol XL 4.  Urinary retention Urecholine QID,Flomax 5.  UTI keflex 6.  Spasticity Zanaflex  LOS (Days) 17 A FACE TO FACE EVALUATION WAS PERFORMED  KIRSTEINS,ANDREW E 02/01/2014, 6:23 AM

## 2014-02-01 NOTE — Progress Notes (Signed)
Occupational Therapy Session Note  Patient Details  Name: [name] MRN: [id number] Date of Birth: 08/11/1966  Today's Date: 02/01/2014 Time: 0851-1000 Time Calculation (min): 69 min  Short Term Goals: Week 2:  OT Short Term Goal 1 (Week 2): Pt will complete tub transfer using TTB with max assist OT Short Term Goal 1 - Progress (Week 2): Met OT Short Term Goal 2 (Week 2): Pt will complete UB dressing with min assist  OT Short Term Goal 2 - Progress (Week 2): Met OT Short Term Goal 3 (Week 2): Pt will complete toilet task with max assist OT Short Term Goal 3 - Progress (Week 2): Not met OT Short Term Goal 4 (Week 2): Pt will complete sit<>stand with mod assist for initiation and balance for 1 grooming task.  OT Short Term Goal 4 - Progress (Week 2): Met Focus on LTGs  Skilled Therapeutic Interventions/Progress Updates:    Pt seen for ADL retraining with focus on functional transfers, NMR to LUE, and standing balance. Pt received sitting EOB. Completed squat pivot transfer to w/c with emphasis on maintaining anterior weight shift to facilitate squat pivot transfer with min assist. Completed bathing with min assist for standing balance during buttocks hygiene while holding onto grab bar. Pt recalls hemi dressing technique and requires positioning of LUE to completed Pt assisted with managing clothing around waist in standing, requiring assist to manage over L hip. Pt able to thread LLE into undergarment with assist for positioning LLE. Therapist donned foot orthosis. Pt with increased pain in abdomen area during anterior weight shift for fastening shoes d/t shots in her stomach. At end of session provided PROM and muscle tapping to facilitate elbow extension. Pt left sitting in w/c with all needs in reach.    Therapy Documentation Precautions:  Precautions Precautions: Fall;ICD/Pacemaker Precaution Comments: pacemaker; chronic back pain Restrictions Weight Bearing Restrictions: No  (block lf. knee, max queing) General:   Vital Signs:   Pain: Pain Assessment Pain Assessment: 0-10 Pain Score: 8  Pain Type: Acute pain Pain Location: Back Pain Orientation: Upper Pain Descriptors / Indicators: Aching Pain Onset: Gradual Pain Intervention(s): RN made aware;Other (Comment);Ambulation/increased activity (RN aware prior to session) Multiple Pain Sites: No ADL:   Exercises:   Other Treatments:    See FIM for current functional status  Therapy/Group: Individual Therapy  Duayne Cal 02/01/2014, 12:33 PM

## 2014-02-01 NOTE — Progress Notes (Signed)
Speech Language Pathology Daily Session Note  Patient Details  Name: [name] MRN: [id number] Date of Birth: [birth year]  Today's Date: 02/01/2014 Time: 0034-9179 Time Calculation (min): 30 min  Short Term Goals: Week 3: SLP Short Term Goal 1 (Week 3): Pt will utilize safe swallowing strategies during meal consisting of regular textures and thin liquids with Mod I SLP Short Term Goal 2 (Week 3): Pt will demonstrate functional problem solving for complex tasks with supervision verbal cues.  SLP Short Term Goal 3 (Week 3): Pt will utilize speech intelligibility strategies at the sentence level with Mod I.  SLP Short Term Goal 3 - Progress (Week 3): Progressing toward goal SLP Short Term Goal 4 (Week 3): Pt will perfrom lingual and labial oral-motor exercises with Min A verbal and visual cues.  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 SLP asked patient what she wanted to work on, and she stated that she has difficulty with performing the oral-motor exercises and speech exercises that she has been taught before. SLP provided patient with demonstration, cues, instruction, and use of mirror for patient to monitor her progress and ability to perform. Patient presents with speech intelligibility at sentence and conversational level that is approximately 90%, however quality of speech is impacted by left-sided facial and labial weakness. Patient stated that she has to watch for drooling/leaking of liquids/saliva when eating. SLP directed and guided patient through exercises to help stimulate and strengthen her weak facial and labial muscles, as well as to increase her awareness to her left side function via use of mirror. Patient able to return-demonstrate and verbalized understanding of benefit of performing exercises on her own. Patient stated that she has  not had any difficulty with recently upgraded solid texture diet, and stated she wanted to practice chewing and manipulating food and gum/sucker etc. on left side of mouth. Continue with plan of care.   FIM:  Comprehension Comprehension Mode: Auditory Comprehension: 5-Follows basic conversation/direction: With no assist Expression Expression Mode: Verbal Expression: 5-Expresses complex 90% of the time/cues < 10% of the time Social Interaction Social Interaction: 6-Interacts appropriately with others with medication or extra time (anti-anxiety, antidepressant). Problem Solving Problem Solving: 5-Solves basic problems: With no assist Memory Memory: 6-More than reasonable amt of time FIM - Eating Eating Activity: 5: Set-up assist for open containers;5: Set-up assist for cut food;6: Swallowing techniques: self-managed  Pain Pain Assessment Pain Assessment: 0-10 Pain Score: 8  Pain Type: Acute pain Pain Location: Back Pain Orientation: Upper Pain Descriptors / Indicators: Aching Pain Frequency: Intermittent Pain Onset: Gradual Patients Stated Pain Goal: 3 Pain Intervention(s): Medication (See eMAR)  Therapy/Group: Individual Therapy  Pablo Lawrence 02/01/2014, 4:25 PM  Angela Nevin, MA, CCC-SLP [facility] Speech-Language Pathologist

## 2014-02-01 NOTE — Progress Notes (Signed)
Results for LAKRESHA, STIFTER (MRN [id number]) as of 02/01/2014 12:11  Ref. Range 01/31/2014 11:51 01/31/2014 16:54 01/31/2014 21:29 02/01/2014 07:31 02/01/2014 11:18  Glucose-Capillary Latest Range: 70-99 mg/dL 492 (H) 010 (H) 071 (H) 297 (H) 286 (H)   CBGs greater than 180 mg/dl.  Recommend increasing Lantus to 25 units daily and increasing Novolog correction scale to MODERATE AC & HS.  If CBGs continue to be greater than 180 mg/dl with changes in insulin regimen, patient may need Novolog meal coverage 4-5 units TID  if eating greater than 50% of meals. Smith Mince RN BSN CDE

## 2014-02-02 ENCOUNTER — Inpatient Hospital Stay (HOSPITAL_COMMUNITY): Payer: Medicaid Other | Admitting: Occupational Therapy

## 2014-02-02 DIAGNOSIS — I5032 Chronic diastolic (congestive) heart failure: Secondary | ICD-10-CM

## 2014-02-02 DIAGNOSIS — I10 Essential (primary) hypertension: Secondary | ICD-10-CM

## 2014-02-02 DIAGNOSIS — G8110 Spastic hemiplegia affecting unspecified side: Secondary | ICD-10-CM

## 2014-02-02 DIAGNOSIS — I6340 Cerebral infarction due to embolism of unspecified cerebral artery: Secondary | ICD-10-CM

## 2014-02-02 DIAGNOSIS — E1142 Type 2 diabetes mellitus with diabetic polyneuropathy: Secondary | ICD-10-CM

## 2014-02-02 DIAGNOSIS — E1149 Type 2 diabetes mellitus with other diabetic neurological complication: Secondary | ICD-10-CM

## 2014-02-02 DIAGNOSIS — I428 Other cardiomyopathies: Secondary | ICD-10-CM

## 2014-02-02 LAB — GLUCOSE, CAPILLARY
Glucose-Capillary: 286 mg/dL — ABNORMAL HIGH (ref 70–99)
Glucose-Capillary: 256 mg/dL — ABNORMAL HIGH (ref 70–99)
Glucose-Capillary: 270 mg/dL — ABNORMAL HIGH (ref 70–99)
Glucose-Capillary: 209 mg/dL — ABNORMAL HIGH (ref 70–99)

## 2014-02-02 NOTE — Progress Notes (Signed)
Subjective/Complaints: Slept well again. No new problems. Remains motivated with therapy. Still doesn't have mask for CPAP. Likes AFO  Review of Systems - Negative except Left weakness, chronic back pain  Objective: Vital Signs: Blood pressure 134/90, pulse 81, temperature 98.1 F (36.7 C), temperature source Oral, resp. rate 17, height 5\' 9"  (1.753 m), weight 121.2 kg (267 lb 3.2 oz), last menstrual period 12/21/2013, SpO2 95.00%. No results found. Results for orders placed during the hospital encounter of 01/15/14 (from the past 72 hour(s))  GLUCOSE, CAPILLARY     Status: Abnormal   Collection Time    01/30/14 11:46 AM      Result Value Ref Range   Glucose-Capillary 279 (*) 70 - 99 mg/dL   Comment 1 Notify RN    GLUCOSE, CAPILLARY     Status: Abnormal   Collection Time    01/30/14  4:49 PM      Result Value Ref Range   Glucose-Capillary 299 (*) 70 - 99 mg/dL   Comment 1 Notify RN    GLUCOSE, CAPILLARY     Status: Abnormal   Collection Time    01/30/14  9:38 PM      Result Value Ref Range   Glucose-Capillary 274 (*) 70 - 99 mg/dL  GLUCOSE, CAPILLARY     Status: Abnormal   Collection Time    01/31/14  7:16 AM      Result Value Ref Range   Glucose-Capillary 237 (*) 70 - 99 mg/dL   Comment 1 Notify RN    GLUCOSE, CAPILLARY     Status: Abnormal   Collection Time    01/31/14 11:51 AM      Result Value Ref Range   Glucose-Capillary 223 (*) 70 - 99 mg/dL   Comment 1 Notify RN    GLUCOSE, CAPILLARY     Status: Abnormal   Collection Time    01/31/14  4:54 PM      Result Value Ref Range   Glucose-Capillary 249 (*) 70 - 99 mg/dL   Comment 1 Notify RN    GLUCOSE, CAPILLARY     Status: Abnormal   Collection Time    01/31/14  9:29 PM      Result Value Ref Range   Glucose-Capillary 204 (*) 70 - 99 mg/dL   Comment 1 Notify RN    GLUCOSE, CAPILLARY     Status: Abnormal   Collection Time    02/01/14  7:31 AM      Result Value Ref Range   Glucose-Capillary 297 (*) 70 - 99 mg/dL   GLUCOSE, CAPILLARY     Status: Abnormal   Collection Time    02/01/14 11:18 AM      Result Value Ref Range   Glucose-Capillary 286 (*) 70 - 99 mg/dL  GLUCOSE, CAPILLARY     Status: Abnormal   Collection Time    02/01/14  4:33 PM      Result Value Ref Range   Glucose-Capillary 220 (*) 70 - 99 mg/dL  GLUCOSE, CAPILLARY     Status: Abnormal   Collection Time    02/01/14  8:50 PM      Result Value Ref Range   Glucose-Capillary 250 (*) 70 - 99 mg/dL   Comment 1 Notify RN    GLUCOSE, CAPILLARY     Status: Abnormal   Collection Time    02/02/14  7:16 AM      Result Value Ref Range   Glucose-Capillary 286 (*) 70 - 99 mg/dL   Comment  1 Notify RN        General: No acute distress Mood and affect are appropriate Heart: Regular rate and rhythm no rubs murmurs or extra sounds Lungs: Clear to auscultation, breathing unlabored, no rales or wheezes Abdomen: Positive bowel sounds, soft nontender to palpation, nondistended Extremities: No clubbing, cyanosis, or edema Skin: No evidence of breakdown, no evidence of rash Neurologic: Cranial nerves II through XII intact, motor strength is 5/5 in R deltoid, bicep, tricep, grip, hip flexor, knee extensors, ankle dorsiflexor and plantar flexor, 0/5 on Left side  Musculoskeletal: Full range of motion in all 4 extremities. No joint swelling   Assessment/Plan: 1. Functional deficits secondary to Right MCA infarct which require 3+ hours per day of interdisciplinary therapy in a comprehensive inpatient rehab setting. Physiatrist is providing close team supervision and 24 hour management of active medical problems listed below. Physiatrist and rehab team continue to assess barriers to discharge/monitor patient progress toward functional and medical goals. FIM: FIM - Bathing Bathing Steps Patient Completed: Chest;Left Arm;Abdomen;Right upper leg;Left upper leg;Right lower leg (including foot);Front perineal area;Left lower leg (including foot) Bathing:  4: Min-Patient completes 8-9 64f 10 parts or 75+ percent  FIM - Upper Body Dressing/Undressing Upper body dressing/undressing steps patient completed: Thread/unthread right sleeve of pullover shirt/dresss;Put head through opening of pull over shirt/dress;Pull shirt over trunk;Thread/unthread left sleeve of pullover shirt/dress Upper body dressing/undressing: 5: Set-up assist to: Obtain clothing/put away (assist for positioning of LUE) FIM - Lower Body Dressing/Undressing Lower body dressing/undressing steps patient completed: Thread/unthread right underwear leg;Thread/unthread right pants leg;Don/Doff right sock;Don/Doff right shoe;Fasten/unfasten right shoe;Fasten/unfasten left shoe;Thread/unthread left underwear leg Lower body dressing/undressing: 3: Mod-Patient completed 50-74% of tasks  FIM - Toileting Toileting steps completed by patient: Performs perineal hygiene Toileting Assistive Devices: Grab bar or rail for support Toileting: 4: Steadying assist  FIM - Diplomatic Services operational officer Devices: Grab bars;Walker Toilet Transfers: 4-To toilet/BSC: Min A (steadying Pt. > 75%);4-From toilet/BSC: Min A (steadying Pt. > 75%)  FIM - Bed/Chair Transfer Bed/Chair Transfer Assistive Devices: Arm rests (leg lifter) Bed/Chair Transfer: 0: Activity did not occur  FIM - Locomotion: Wheelchair Distance: 180 Locomotion: Wheelchair: 5: Travels 150 ft or more: maneuvers on rugs and over door sills with supervision, cueing or coaxing FIM - Locomotion: Ambulation Locomotion: Ambulation Assistive Devices: Other (comment) (R hallway hand rail; L AFO) Ambulation/Gait Assistance: 3: Mod assist Locomotion: Ambulation: 1: Travels less than 50 ft with moderate assistance (Pt: 50 - 74%)  Comprehension Comprehension Mode: Auditory Comprehension: 5-Follows basic conversation/direction: With no assist  Expression Expression Mode: Verbal Expression: 5-Expresses complex 90% of the time/cues <  10% of the time  Social Interaction Social Interaction: 6-Interacts appropriately with others with medication or extra time (anti-anxiety, antidepressant).  Problem Solving Problem Solving: 5-Solves basic problems: With no assist  Memory Memory: 6-More than reasonable amt of time  Medical problem list 1.  CVA prophyllaxis ASA 325mg  Qd 2.  DVT prophyllaxis Heparin 5000U sq TID 3. CHF compensatedDigoxin, Lasix, toprol XL 4.  Urinary retention Urecholine QID,Flomax 5.  UTI keflex 6.  Spasticity Zanaflex 7. Pain control: reasonably managed just with tylenol. We discussed the fact that this shows her that she can do with less pain medication on an outpt basis.  LOS (Days) 18 A FACE TO FACE EVALUATION WAS PERFORMED  Hiran Leard T 02/02/2014, 8:36 AM

## 2014-02-02 NOTE — Progress Notes (Signed)
Occupational Therapy Session Note  Patient Details  Name: [name] MRN: [id number] Date of Birth: 03-26-1966  Today's Date: 02/02/2014 Time: 1330-1400 Time Calculation (min): 30 min  Short Term Goals: Week 1:  OT Short Term Goal 1 (Week 1): Patient will complete sit<>stand during self-care task with max assist  OT Short Term Goal 1 - Progress (Week 1): Met OT Short Term Goal 2 (Week 1): Patient will complete squat pivot transfer total assist +1 OT Short Term Goal 2 - Progress (Week 1): Met OT Short Term Goal 3 (Week 1): Pt will complete UB dressing with mod assist OT Short Term Goal 3 - Progress (Week 1): Met OT Short Term Goal 4 (Week 1): Pt will complete toilet task with total assist +1 OT Short Term Goal 4 - Progress (Week 1): Met Week 2:  OT Short Term Goal 1 (Week 2): Pt will complete tub transfer using TTB with max assist OT Short Term Goal 1 - Progress (Week 2): Met OT Short Term Goal 2 (Week 2): Pt will complete UB dressing with min assist  OT Short Term Goal 2 - Progress (Week 2): Met OT Short Term Goal 3 (Week 2): Pt will complete toilet task with max assist OT Short Term Goal 3 - Progress (Week 2): Not met OT Short Term Goal 4 (Week 2): Pt will complete sit<>stand with mod assist for initiation and balance for 1 grooming task.  OT Short Term Goal 4 - Progress (Week 2): Met Week 3:  OT Short Term Goal 1 (Week 3): Focus on LTGs       Skilled Therapeutic Interventions/Progress Updates:    No c/o pain this session.  At start of session, pt had just completed a bath and most of her dressing with the nurse tech. Therapy initiated treatment to focus on sit to stand and standing balance to continue donning her brief and pants. Pt stood at sink with only guiding assist to stand and stood with R hand on sink with S while therapist assisted pulling up her pants.  Pt then requested to have her socks and shoes donned. Pt was provided with a shoe horn to borrow to help with donning  L shoe.  Pt has a very tight L elbow and she worked on self ROM with guidance. Towel slide exercises initiated on bedside table for PROM using R hand to assist L with sh flexion and extension and elb flex and ext with grasping.  Pt resting in chair at end of session with call light and phone in reach.    Therapy Documentation Precautions:  Precautions Precautions: Fall;ICD/Pacemaker Precaution Comments: pacemaker; chronic back pain Restrictions Weight Bearing Restrictions: No       ADL:  See FIM for current functional status  Therapy/Group: Individual Therapy  Chukwudi Ewen 02/02/2014, 3:21 PM

## 2014-02-03 ENCOUNTER — Inpatient Hospital Stay (HOSPITAL_COMMUNITY): Payer: Medicaid Other | Admitting: Physical Therapy

## 2014-02-03 LAB — GLUCOSE, CAPILLARY
Glucose-Capillary: 223 mg/dL — ABNORMAL HIGH (ref 70–99)
Glucose-Capillary: 261 mg/dL — ABNORMAL HIGH (ref 70–99)
Glucose-Capillary: 271 mg/dL — ABNORMAL HIGH (ref 70–99)
Glucose-Capillary: 279 mg/dL — ABNORMAL HIGH (ref 70–99)
Glucose-Capillary: 228 mg/dL — ABNORMAL HIGH (ref 70–99)

## 2014-02-03 MED ORDER — TIZANIDINE HCL 2 MG PO TABS
2.0000 mg | ORAL_TABLET | Freq: Two times a day (BID) | ORAL | Status: DC
  Administered 2014-02-03 – 2014-02-05 (×4): 2 mg via ORAL
  Filled 2014-02-03 (×6): qty 1

## 2014-02-03 NOTE — Progress Notes (Signed)
Prefers to wear O2 at 2L/M via [place] instead of CPAP at HS. Wears left resting hand splint at HS. Tearful at HS, upset children "aren't doing what they're suppose to do." Denied need for antidepressant at this time.[name]

## 2014-02-03 NOTE — Progress Notes (Signed)
Physical Therapy Session Note  Patient Details  Name: [name] MRN: [id number] Date of Birth: May 08, 1966  Today's Date: 02/03/2014 Time: 0900-1000 Time Calculation (min): 60 min  Short Term Goals: Week 3:  PT Short Term Goal 1 (Week 3): STG's=LTG's secondary to ELOS  Skilled Therapeutic Interventions/Progress Updates:    Pt received sitting in geri chair. Pt performed therapist assisted dressing per pt request, Upper body mod assist, lower body mod to max assist, socks, shoes and L AFO max assist. Pt performed sit to stand with use of R UE and RW x 3, squat pivot transfer to wheelchair with mod assist to L side. Wheelchair mobility 160' with supervision assist with pt c/o fatigue and requested being pushed the rest of the way to the gym.  Pt L UE per pt report very stiff today, states she was seen by MD this morning and he aware. Pt L UE displayed max tightness, attempted L UE PROM to improve, minimal change. Attempted gait training with pt but do to rigidity in L UE unable to perform as therapist uanble to provide adequate support to be able to advance L LE, pre gait activities for stepping fwd and back at handrail x 3 with pt reporting fatigue. Sit to stand in parallel bars with mod assist with mini-squats x 10 with therapist support provided to L knee, weight shifting activities to improve weight shift to L LE x 3 trials with pt reporting fatigue when complete, able to tolerate between 2-3 mins of standing in parallel bars. Pt very motivated.   Therapy Documentation Precautions:  Precautions Precautions: Fall;ICD/Pacemaker Precaution Comments: pacemaker; chronic back pain Restrictions Weight Bearing Restrictions: No       Pain: Pain Assessment Pain Assessment: 0-10 Pain Score: 6  Pain Type: Acute pain Pain Location: Arm Pain Orientation: Left Pain Descriptors / Indicators: Aching            :       See FIM for current functional status  Therapy/Group:  Individual Therapy  Jackelyn Knife 02/03/2014, 10:37 AM

## 2014-02-03 NOTE — Progress Notes (Signed)
Subjective/Complaints: Having some tightness in her right elbow and shoulder. Otherwise doing well.  Review of Systems - Negative except Left weakness, chronic back pain  Objective: Vital Signs: Blood pressure 130/84, pulse 83, temperature 97.8 F (36.6 C), temperature source Oral, resp. rate 19, height 5\' 9"  (1.753 m), weight 121.2 kg (267 lb 3.2 oz), last menstrual period 12/21/2013, SpO2 99.00%. No results found. Results for orders placed during the hospital encounter of 01/15/14 (from the past 72 hour(s))  GLUCOSE, CAPILLARY     Status: Abnormal   Collection Time    01/31/14 11:51 AM      Result Value Ref Range   Glucose-Capillary 223 (*) 70 - 99 mg/dL   Comment 1 Notify RN    GLUCOSE, CAPILLARY     Status: Abnormal   Collection Time    01/31/14  4:54 PM      Result Value Ref Range   Glucose-Capillary 249 (*) 70 - 99 mg/dL   Comment 1 Notify RN    GLUCOSE, CAPILLARY     Status: Abnormal   Collection Time    01/31/14  9:29 PM      Result Value Ref Range   Glucose-Capillary 204 (*) 70 - 99 mg/dL   Comment 1 Notify RN    GLUCOSE, CAPILLARY     Status: Abnormal   Collection Time    02/01/14  7:31 AM      Result Value Ref Range   Glucose-Capillary 297 (*) 70 - 99 mg/dL  GLUCOSE, CAPILLARY     Status: Abnormal   Collection Time    02/01/14 11:18 AM      Result Value Ref Range   Glucose-Capillary 286 (*) 70 - 99 mg/dL  GLUCOSE, CAPILLARY     Status: Abnormal   Collection Time    02/01/14  4:33 PM      Result Value Ref Range   Glucose-Capillary 220 (*) 70 - 99 mg/dL  GLUCOSE, CAPILLARY     Status: Abnormal   Collection Time    02/01/14  8:50 PM      Result Value Ref Range   Glucose-Capillary 250 (*) 70 - 99 mg/dL   Comment 1 Notify RN    GLUCOSE, CAPILLARY     Status: Abnormal   Collection Time    02/02/14  7:16 AM      Result Value Ref Range   Glucose-Capillary 286 (*) 70 - 99 mg/dL   Comment 1 Notify RN    GLUCOSE, CAPILLARY     Status: Abnormal   Collection  Time    02/02/14 11:52 AM      Result Value Ref Range   Glucose-Capillary 256 (*) 70 - 99 mg/dL   Comment 1 Notify RN    GLUCOSE, CAPILLARY     Status: Abnormal   Collection Time    02/02/14  4:58 PM      Result Value Ref Range   Glucose-Capillary 270 (*) 70 - 99 mg/dL  GLUCOSE, CAPILLARY     Status: Abnormal   Collection Time    02/02/14  9:35 PM      Result Value Ref Range   Glucose-Capillary 209 (*) 70 - 99 mg/dL  GLUCOSE, CAPILLARY     Status: Abnormal   Collection Time    02/03/14  7:28 AM      Result Value Ref Range   Glucose-Capillary 223 (*) 70 - 99 mg/dL   Comment 1 Notify RN        General: No acute distress  Mood and affect are appropriate Heart: Regular rate and rhythm no rubs murmurs or extra sounds Lungs: Clear to auscultation, breathing unlabored, no rales or wheezes Abdomen: Positive bowel sounds, soft nontender to palpation, nondistended Extremities: No clubbing, cyanosis, or edema Skin: No evidence of breakdown, no evidence of rash Neurologic: Cranial nerves II through XII intact, motor strength is 5/5 in R deltoid, bicep, tricep, grip, hip flexor, knee extensors, ankle dorsiflexor and plantar flexor, 0/5 on Left side, 2/4 tone bicep, pecs on left  Musculoskeletal:  . No joint swelling   Assessment/Plan: 1. Functional deficits secondary to Right MCA infarct which require 3+ hours per day of interdisciplinary therapy in a comprehensive inpatient rehab setting. Physiatrist is providing close team supervision and 24 hour management of active medical problems listed below. Physiatrist and rehab team continue to assess barriers to discharge/monitor patient progress toward functional and medical goals. FIM: FIM - Bathing Bathing Steps Patient Completed: Chest;Left Arm;Abdomen;Right upper leg;Left upper leg;Right lower leg (including foot);Front perineal area;Left lower leg (including foot) Bathing: 4: Min-Patient completes 8-9 24f 10 parts or 75+ percent  FIM -  Upper Body Dressing/Undressing Upper body dressing/undressing steps patient completed: Thread/unthread right sleeve of pullover shirt/dresss;Put head through opening of pull over shirt/dress;Pull shirt over trunk;Thread/unthread left sleeve of pullover shirt/dress Upper body dressing/undressing: 5: Set-up assist to: Obtain clothing/put away (assist for positioning of LUE) FIM - Lower Body Dressing/Undressing Lower body dressing/undressing steps patient completed: Thread/unthread right underwear leg;Thread/unthread right pants leg;Don/Doff right sock;Don/Doff right shoe;Fasten/unfasten right shoe;Fasten/unfasten left shoe;Thread/unthread left underwear leg Lower body dressing/undressing: 3: Mod-Patient completed 50-74% of tasks  FIM - Toileting Toileting steps completed by patient: Performs perineal hygiene Toileting Assistive Devices: Grab bar or rail for support Toileting: 4: Steadying assist  FIM - Diplomatic Services operational officer Devices: Grab bars;Walker Toilet Transfers: 4-To toilet/BSC: Min A (steadying Pt. > 75%);4-From toilet/BSC: Min A (steadying Pt. > 75%)  FIM - Bed/Chair Transfer Bed/Chair Transfer Assistive Devices: Arm rests (leg lifter) Bed/Chair Transfer: 0: Activity did not occur  FIM - Locomotion: Wheelchair Distance: 180 Locomotion: Wheelchair: 5: Travels 150 ft or more: maneuvers on rugs and over door sills with supervision, cueing or coaxing FIM - Locomotion: Ambulation Locomotion: Ambulation Assistive Devices: Other (comment) (R hallway hand rail; L AFO) Ambulation/Gait Assistance: 3: Mod assist Locomotion: Ambulation: 1: Travels less than 50 ft with moderate assistance (Pt: 50 - 74%)  Comprehension Comprehension Mode: Auditory Comprehension: 5-Follows basic conversation/direction: With no assist  Expression Expression Mode: Verbal Expression: 5-Expresses complex 90% of the time/cues < 10% of the time  Social Interaction Social Interaction:  6-Interacts appropriately with others with medication or extra time (anti-anxiety, antidepressant).  Problem Solving Problem Solving: 5-Solves basic problems: With no assist  Memory Memory: 6-More than reasonable amt of time  Medical problem list 1.  CVA prophyllaxis ASA 325mg  Qd 2.  DVT prophyllaxis Heparin 5000U sq TID 3. CHF compensatedDigoxin, Lasix, toprol XL 4.  Urinary retention Urecholine QID,Flomax 5.  UTI keflex 6.  Spasticity Zanaflex--increase dosing today and observe for tolerance and effect 7. Pain control: reasonably managed just with tylenol. We discussed the fact that this shows her that she can do with less pain medication on an outpt basis.  LOS (Days) 19 A FACE TO FACE EVALUATION WAS PERFORMED  Clarisa Danser T 02/03/2014, 10:22 AM

## 2014-02-03 NOTE — Progress Notes (Signed)
Resp Care Note;Pt has been refusing Cpap since 01/19/2014.Just wants to wear nasal cannula.

## 2014-02-03 NOTE — Progress Notes (Signed)
Resp Care Note:Pt has not worn Cpap since 2/7,perfers to wear nasal cannula at 2lpm.

## 2014-02-04 ENCOUNTER — Inpatient Hospital Stay (HOSPITAL_COMMUNITY): Payer: Medicaid Other

## 2014-02-04 ENCOUNTER — Inpatient Hospital Stay (HOSPITAL_COMMUNITY): Payer: Medicaid Other | Admitting: Physical Therapy

## 2014-02-04 DIAGNOSIS — E1149 Type 2 diabetes mellitus with other diabetic neurological complication: Secondary | ICD-10-CM

## 2014-02-04 DIAGNOSIS — G8110 Spastic hemiplegia affecting unspecified side: Secondary | ICD-10-CM

## 2014-02-04 DIAGNOSIS — I428 Other cardiomyopathies: Secondary | ICD-10-CM

## 2014-02-04 DIAGNOSIS — I6340 Cerebral infarction due to embolism of unspecified cerebral artery: Secondary | ICD-10-CM

## 2014-02-04 DIAGNOSIS — I10 Essential (primary) hypertension: Secondary | ICD-10-CM

## 2014-02-04 DIAGNOSIS — E1142 Type 2 diabetes mellitus with diabetic polyneuropathy: Secondary | ICD-10-CM

## 2014-02-04 DIAGNOSIS — I5032 Chronic diastolic (congestive) heart failure: Secondary | ICD-10-CM

## 2014-02-04 LAB — GLUCOSE, CAPILLARY
GLUCOSE-CAPILLARY: 240 mg/dL — AB (ref 70–99)
GLUCOSE-CAPILLARY: 223 mg/dL — AB (ref 70–99)
Glucose-Capillary: 211 mg/dL — ABNORMAL HIGH (ref 70–99)
Glucose-Capillary: 160 mg/dL — ABNORMAL HIGH (ref 70–99)

## 2014-02-04 MED ORDER — INSULIN GLARGINE 100 UNIT/ML ~~LOC~~ SOLN
25.0000 [IU] | Freq: Every day | SUBCUTANEOUS | Status: DC
  Administered 2014-02-04: 25 [IU] via SUBCUTANEOUS
  Filled 2014-02-04: qty 0.25

## 2014-02-04 NOTE — Progress Notes (Signed)
Occupational Therapy Discharge Summary  Patient Details  Name: [name] MRN: [id number] Date of Birth: 01-21-1966  Today's Date: 02/04/2014 Time: 1000-1102 Time Calculation (min): 62 min  Patient has met 9 of 10 long term goals due to improved activity tolerance, improved balance, postural control, ability to compensate for deficits, improved attention and improved awareness.  Patient to discharge at overall min-mod assist level.  Patient's care partner is independent to provide the necessary physical and cognitive assistance at discharge. Patient's daughter has participated in hands on family training with self-care tasks and demonstrates the ability to safely assist. Patient also has a CNA to assist during the day as well however he was unable to attend family training. Patient's son was scheduled for family training however did not attend and this therapist does not recommend son assisting with transfers and self-care tasks.     Reasons goals not met: Patient's tone in LUE impacting ability to use LUE as stabilizer.   Recommendation:  Patient will benefit from ongoing skilled OT services in home health setting to continue to advance functional skills in the area of BADLs, functional use of LUE, family training, functional transfers, standing balance, postural control.  Equipment: TTB  Reasons for discharge: treatment goals met and discharge from hospital  Patient/family agrees with progress made and goals achieved: Yes  Skilled Therapeutic Intervention  Pt seen for ADL retraining with focus on functional transfers, sit<>stand, postural control in standing, and family training/education. Pt received supine in bed with daughter present. Pt with increased tone in L elbow and therapist provided PROM and with increased time able to gain full range with no pain reported from pt. Provided education to daughter with PROM of LUE and provided return demonstration. Practiced functional  transfers (toilet and shower) with therapist providing initial demonstration then having daughter return it. Pt directing care about w/c placement and positioning of LLE. Completed stand pivot transfer at toilet with min assist using grab bar and daughter returned demonstration. Daughter participated in hands on bathing and dressing requiring cues from therapist to allow pt to wash as much as possible. Good carryover of positioning of LUE during tasks and blocking L knee during sit<>stand. Provided assistance for positioning of LLE as pt threaded LB clothing. Provided education on donning orthosis and able to return demonstration with increased time. Practice tub transfer with daughter returning demonstration with mod assist to transfer w/c>TTB and min assist for squat pivot transfer TTB>w/c. Pt and daughter had slight argument during therapy session however able to redirect and pt and daughter continued hands on training. Pt and daughter reported feeling comfortable about discharge. Therapist provided extensive education on not leaving pt alone and safety at home. Daughter also reported she has cleaned house to allow for plenty of space in home to maneuver w/c.   OT Discharge Precautions/Restrictions  Precautions Precautions: Fall;ICD/Pacemaker Precaution Comments: chronic back pain Restrictions Weight Bearing Restrictions: No General   Vital Signs Therapy Vitals Pulse Rate: 84 BP: 129/91 mmHg Patient Position, if appropriate: Sitting Pain Pain Assessment Pain Assessment: No/denies pain ADL   Vision/Perception  Vision - History Baseline Vision: Wears glasses only for reading Patient Visual Report: No change from baseline Vision - Assessment Eye Alignment: Within Functional Limits Vision Assessment: Vision tested Perception Perception: Within Functional Limits Praxis Praxis: Intact  Cognition Overall Cognitive Status: Within Functional Limits for tasks assessed Arousal/Alertness:  Awake/alert Orientation Level: Oriented X4 Attention: Sustained Sustained Attention: Appears intact Selective Attention: Appears intact Memory: Appears intact Awareness: Appears intact  Problem Solving: Appears intact Safety/Judgment: Appears intact Sensation Coordination Gross Motor Movements are Fluid and Coordinated: No Fine Motor Movements are Fluid and Coordinated: No Finger Nose Finger Test: unable to assess LLE d/t weakness Motor    Mobility     Trunk/Postural Assessment     Balance   Extremity/Trunk Assessment RUE Assessment RUE Assessment: Within Functional Limits LUE Assessment LUE Assessment: Exceptions to [facility] - B (trace activation in shoulder; increased tone in LUE)  See FIM for current functional status  Art Levan N 02/04/2014, 12:20 PM

## 2014-02-04 NOTE — Progress Notes (Signed)
Physical Therapy Discharge Summary  Patient Details  Name: [name] MRN: [id number] Date of Birth: [birth year]  Today's Date: 02/04/2014 Time: 1100-1202 and 3500-9381 Time Calculation (min): 62 min and 35 min  Patient has met 8 of 8 long term goals due to improved activity tolerance, improved balance, improved postural control, increased strength, increased range of motion, ability to compensate for deficits and functional use of  left lower extremity.  Patient to discharge at a wheelchair level [place].   Patient's care partner is independent to provide the necessary physical assistance at discharge. Additional persons planned to provide hands-on assistance to patient (son and home health CNA) were not present for planned sessions focusing on hands-on family training. Therefore, therapist verbally recommended that only daughter provide hands-on assistance to patient upon discharge. Pt and daughter verbalized understanding.   Reasons goals not met: N/A; all goals met.  Recommendation:  Patient will benefit from ongoing skilled PT services in home health setting to continue to advance safe functional mobility, address ongoing impairments in L hemiplegia, decreased stability/independence with functional mobility and ambulation, and minimize fall risk.  Equipment: w/c and cushion (20"x18"), bariatric rolling walker, left ankle-foot orthotic, hospital bed  Reasons for discharge: treatment goals met and discharge from hospital  Patient/family agrees with progress made and goals achieved: Yes  Skilled Therapeutic Interventions/Progress Updates: Treatment Session 1: Pt received seated in w/c with family members present; agreeable to therapy. Session focused on hands-on family training with patient's daughter. Pt's son not present for session. Pt wearing L AFO throughout session.  Therapist explained and demonstrated appropriate assist with the following to daughter: squat pivot to  multiple surface heights (w/c level then elevated surface height) with min A, scoot pivot from elevated surface height with close supervision to min A, squat pivot transfer to/from car with min A, supine<>sit with HOB flat with no rails and min A. Daughter gave effective return demonstration of safe assistance and appropriate cueing with all said aspects of functional mobility. Therapist explained,demonstrated how daughter can utilize knee flexion (as opposed to thoracic spine flexion) during transfers to prevent injury. Therapist then explained and demonstrated the following: positioning of pt's LUE to preserve L shoulder function/prevent subluxation; how to break down w/c and manage w/c parts; daughter verbalized understanding and gave effective return demonstration. Pt's daughter reports feeling safe and comfortable providing assistance with all transfers and functional mobility.   Pt performed w/c mobility x150' in controlled environment with supervision using R hemi technique. Session ended in pt room, where pt was left seated in w/c with family members present and all needs within reach.  Treatment Session 2: Pt received semi-reclined in bed; agreeable to session but upset that home health aide is not present for scheduled hands-on training. Due to absence of aid, session focused on bed mobility, functional transfers, w/c mobility, and gait training.   Pt performed supine>sit with HOB flat, no rails, and supervision with cueing for initiation, technique, and sequencing. While seated EOB, donned shoes and L AFO. Pt performed scoot pivot from bed>w/c with close supervision (due to bed height being elevated as compared with w/c seat height.  W/c mobility x200' in controlled, home, and community environments with supervision using R hemi technique. Pt performed gait x25' in controlled environment with rolling walker, L AFO, and mod A for LLE advancement, LLE placement, and min manual facilitation of R weight  shift prior to LLE advancement. Session ended in pt room, where pt performed squat pivot transfer from w/c>bed (  elevated height) with min A, manual facilitation of anterior weight shift. Seated EOB, doffed AFO and shoes. Sit>supine with HOB flat, no rails, and min a for LLE management. In supine, pt performed scooting to [facility] with supervision, HOB flat using R rail. Therapist departed with pt semi-reclined in bed with bed alarm on, 3 bed rails up, and all needs within reach.   During session, educated pt on recommendation that she ambulate only with assistance of physiical therapist; family members are not to assist with gait after discharge due to pt risk of falling. Pt verbalized understanding.   PT Discharge Precautions/Restrictions  Fall Pain Pt reports no pain during AM/PM sessions. Vision/Perception  Vision - History Baseline Vision: Wears glasses only for reading Patient Visual Report: No change from baseline Vision - Assessment Eye Alignment: Within Functional Limits Vision Assessment: Vision tested Perception Perception: Within Functional Limits Praxis Praxis: Intact  Cognition Overall Cognitive Status: Within Functional Limits for tasks assessed Arousal/Alertness: Awake/alert Orientation Level: Oriented X4 Attention: Sustained Sustained Attention: Appears intact Selective Attention: Appears intact Memory: Appears intact Awareness: Appears intact Problem Solving: Appears intact Safety/Judgment: Appears intact Sensation Sensation Light Touch: Impaired by gross assessment Proprioception: Impaired by gross assessment Additional Comments: Light touch dimished on LLE as compared with RLE.  LLE proprioception impaired. Coordination Gross Motor Movements are Fluid and Coordinated: No Fine Motor Movements are Fluid and Coordinated: No Coordination and Movement Description: Unable to assess coordination in LUE/LLE secondary to weakness. Finger Nose Finger Test: unable to assess  LLE d/t weakness Motor  Motor Motor: Hemiplegia;Abnormal tone Motor - Discharge Observations: Abnormal tone in LUE  Mobility Bed Mobility Bed Mobility: Supine to Sit;Sit to Supine;Scooting to [facility];Sitting - Scoot to Edge of Bed Supine to Sit: HOB flat;5: Supervision Supine to Sit Details: Verbal cues for technique;Verbal cues for sequencing Sitting - Scoot to Edge of Bed: 5: Supervision Sit to Supine: 4: Min assist Sit to Supine - Details: Verbal cues for technique Sit to Supine - Details (indicate cue type and reason): Min A for LLE management Scooting to [facility]: With rail;5: Supervision Transfers Transfers: Yes Sit to Stand: 4: Min assist;From chair/3-in-1;With upper extremity assist Sit to Stand Details: Manual facilitation for weight shifting Sit to Stand Details (indicate cue type and reason): Sit>stand with RUE support at hallway hand rail with min A, manual facilitation of anterior weight shift Stand to Sit: 4: Min assist;With upper extremity assist Stand Pivot Transfers: 4: Min assist Squat Pivot Transfers: With armrests;From elevated surface;5: Supervision;4: Min Risk manager Details: Manual facilitation for weight shifting Squat Pivot Transfer Details (indicate cue type and reason): Squat pivot/scoot pivot from elevated surface or w/c level with close supervision, L AFO. Squat pivot to elevated surface with min A, manual facilitation of anterior weight shifting Locomotion  Ambulation Ambulation: Yes Ambulation/Gait Assistance: 3: Mod assist Ambulation Distance (Feet): 25 Feet Assistive device: Rolling walker (with L hand orthosis) Ambulation/Gait Assistance Details: Manual facilitation of LLE advancement, LLE placement. Pt wearing L AFO. Gait Gait: Yes Gait Pattern: Impaired Gait Pattern: Step-to pattern;Decreased step length - right;Decreased stance time - left;Decreased weight shift to right;Decreased weight shift to left;Decreased dorsiflexion - left (When  not wearing L AFO, pt exhibits decreased L ankle dorsiflexion and L genu recurvatum) Stairs / Additional Locomotion Stairs: No Wheelchair Mobility Wheelchair Mobility: Yes Wheelchair Assistance: 5: Supervision Wheelchair Propulsion: Right upper extremity;Right lower extremity Wheelchair Parts Management: Supervision/cueing;Needs assistance (When using lap tray, requires assist with management of L leg rest, brake.) Distance: 200  Trunk/Postural Assessment  Cervical Assessment Cervical Assessment: Within Functional Limits Thoracic Assessment Thoracic Assessment: Within Functional Limits Lumbar Assessment Lumbar Assessment: Within Functional Limits Postural Control Postural Control: Deficits on evaluation Trunk Control: WFL in seated; limited with dynamic standing.  Balance Balance Balance Assessed: Yes Static Sitting Balance Static Sitting - Balance Support: Feet supported;No upper extremity supported;Right upper extremity supported Static Sitting - Level of Assistance: 6: Modified independent (Device/Increase time);5: Stand by assistance Static Sitting - Comment/# of Minutes: Mod I with sitting EOB x5 minutes with RUE support at bed rail; no LOB. Seated EOB >5 minutes without UE support, SBA. Dynamic Sitting Balance Dynamic Sitting - Balance Support: Feet supported;During functional activity Dynamic Sitting - Level of Assistance: 5: Stand by assistance Dynamic Sitting - Balance Activities: Forward lean/weight shifting;Lateral lean/weight shifting;Reaching for objects;Reaching across midline Static Standing Balance Static Standing - Balance Support: Right upper extremity supported Static Standing - Level of Assistance: 5: Stand by assistance;4: Min assist Static Standing - Comment/# of Minutes: Static standing with RUE support at hallway handrail x1 minute wihtout LOB; SBA to min guard for stability/balance Dynamic Standing Balance Dynamic Standing - Balance Support: Right upper  extremity supported;During functional activity Dynamic Standing - Level of Assistance: 3: Mod assist;4: Min assist Dynamic Standing - Balance Activities: Lateral lean/weight shifting;Forward lean/weight shifting;Reaching for objects Extremity Assessment  RUE Assessment RUE Assessment: Within Functional Limits LUE Assessment LUE Assessment: Exceptions to [facility] (trace activation in shoulder; increased tone in LUE) RLE Assessment RLE Assessment: Within Functional Limits LLE Assessment LLE Assessment: Exceptions to [facility] LLE Strength LLE Overall Strength Comments: Trace muscle activation in L gluteus maximus. Unablle to elicit activation in all other joints, all planes.  See FIM for current functional status  Hobble, Malva Cogan 02/04/2014, 4:24 PM

## 2014-02-04 NOTE — Progress Notes (Signed)
Speech Language Pathology Discharge Summary & Final Treatment Note  Patient Details  Name: [name] MRN: [id number] Date of Birth: 1965/12/25  Today's Date: 02/04/2014 Time: 0916-1000 Time Calculation (min): 44 min  Skilled Therapeutic Interventions:  Skilled treatment focused on education, with daughter arriving for the last ~5 minutes for summary of session. SLP facilitated session with education regarding current functional status and review of recommendations. During functional conversation, pt demonstrated anticipatory awareness of discharge needs with Mod I, and complex verbal problem solving with supervision level question cues.     Patient has met 5 of 5 long term goals.  Patient to discharge at overall Modified Independent;Supervision level.  Reasons goals not met: N/A   Clinical Impression/Discharge Summary: Pt has met 5 out of 5 LTGs during this admission, with functional gains in communication, swallowing, and cognitive skills. Pt is tolerating a regular diet and thin liquids with Mod I. She is communicating at the conversational level with Mod I, although mild dysarthria remains due to persistent reduced strength and ROM on her left side. She requires supervision for complex problem solving and working memory, although pt reports that she had some difficulty with this at baseline, and that she has assistance lined up for medication and money management. Pt is scheduled to discharge home with family support, where she will benefit from further SLP services to maximize functional communication.   Care Partner:  Caregiver Able to Provide Assistance: Yes  Type of Caregiver Assistance: Cognitive  Recommendation:  Home Health SLP  Rationale for SLP Follow Up: Maximize functional communication   Equipment:  None recommended by SLP  Reasons for discharge: Treatment goals met;Discharged from hospital   Patient/Family Agrees with Progress Made and Goals Achieved: Yes   See  FIM for current functional status   Germain Osgood, M.A. CCC-SLP [phone]  Germain Osgood 02/04/2014, 3:47 PM

## 2014-02-04 NOTE — Discharge Instructions (Signed)
Inpatient Rehab Discharge Instructions  [name] Discharge date and time: No discharge date for patient encounter.   Activities/Precautions/ Functional Status: Activity: activity as tolerated Diet: diabetic diet Wound Care: none needed Functional status:  ___ No restrictions     ___ Walk up steps independently ___ 24/7 supervision/assistance   ___ Walk up steps with assistance ___ Intermittent supervision/assistance  ___ Bathe/dress independently ___ Walk with walker     ___ Bathe/dress with assistance ___ Walk Independently    ___ Shower independently _x STROKE/TIA DISCHARGE INSTRUCTIONS SMOKING Cigarette smoking nearly doubles your risk of having a stroke & is the single most alterable risk factor  If you smoke or have smoked in the last 12 months, you are advised to quit smoking for your health.  Most of the excess cardiovascular risk related to smoking disappears within a year of stopping.  Ask you doctor about anti-smoking medications  [place] Quit Line: 1-800-QUIT NOW  Free Smoking Cessation Classes [phone]  CHOLESTEROL Know your levels; limit fat & cholesterol in your diet  Lipid Panel     Component Value Date/Time   CHOL 187 01/10/2014 0541   TRIG 194* 01/10/2014 0541   HDL 43 01/10/2014 0541   CHOLHDL 4.3 01/10/2014 0541   VLDL 39 01/10/2014 0541   LDLCALC 105* 01/10/2014 0541      Many patients benefit from treatment even if their cholesterol is at goal.  Goal: Total Cholesterol (CHOL) less than 160  Goal:  Triglycerides (TRIG) less than 150  Goal:  HDL greater than 40  Goal:  LDL (LDLCALC) less than 100   BLOOD PRESSURE American Stroke Association blood pressure target is less that 120/80 mm/Hg  Your discharge blood pressure is:  BP: 129/91 mmHg  Monitor your blood pressure  Limit your salt and alcohol intake  Many individuals will require more than one medication for high blood pressure  DIABETES (A1c is a blood sugar average for last 3 months)  Goal HGBA1c is under 7% (HBGA1c is blood sugar average for last 3 months)  Diabetes:     Lab Results  Component Value Date   HGBA1C 10.7 12/19/2013     Your HGBA1c can be lowered with medications, healthy diet, and exercise.  Check your blood sugar as directed by your physician  Call your physician if you experience unexplained or low blood sugars.  PHYSICAL ACTIVITY/REHABILITATION Goal is 30 minutes at least 4 days per week  Activity: No driving, Therapies: Physical Therapy: Home Health Return to work:   Activity decreases your risk of heart attack and stroke and makes your heart stronger.  It helps control your weight and blood pressure; helps you relax and can improve your mood.  Participate in a regular exercise program.  Talk with your doctor about the best form of exercise for you (dancing, walking, swimming, cycling).  DIET/WEIGHT Goal is to maintain a healthy weight  Your discharge diet is: Carb Control  liquids Your height is:  Height: 5\' 9"  (175.3 cm) Your current weight is: Weight: 121.2 kg (267 lb 3.2 oz) Your Body Mass Index (BMI) is:  BMI (Calculated): 40.6  Following the type of diet specifically designed for you will help prevent another stroke.  Your goal weight range is:    Your goal Body Mass Index (BMI) is 19-24.  Healthy food habits can help reduce 3 risk factors for stroke:  High cholesterol, hypertension, and excess weight.  RESOURCES Stroke/Support Group:  Call [phone]   STROKE EDUCATION PROVIDED/REVIEWED AND GIVEN  TO PATIENT Stroke warning signs and symptoms How to activate emergency medical system (call 911). Medications prescribed at discharge. Need for follow-up after discharge. Personal risk factors for stroke. Pneumonia vaccine given: Flu vaccine given: No My questions have been answered, the writing is legible, and I understand these instructions.  I will adhere to these goals & educational materials that have been provided to me after my  discharge from the hospital.    __ Walk with assistance    ___ Shower with assistance ___ No alcohol     ___ Return to work/school ________  Special Instructions:    COMMUNITY REFERRALS UPON DISCHARGE:    Home Health:   PT, OT, RN, SPT, SW     Agency:[facility] Phone:[phone] Date of last service:02/05/2014   Medical Equipment/Items Ordered:HOSPTIAL BED, TUB BENCH, Sudie Bailey, Massie Maroon  Agency/Supplier:[facility]   [phone] Other:[employer]  GENERAL COMMUNITY RESOURCES FOR PATIENT/FAMILY: Support Groups:[facility] SUPPORT GROUP  My questions have been answered and I understand these instructions. I will adhere to these goals and the provided educational materials after my discharge from the hospital.  Patient/Caregiver Signature _______________________________ Date __________  Clinician Signature _______________________________________ Date __________  Please bring this form and your medication list with you to all your follow-up doctor's appointments.

## 2014-02-04 NOTE — Progress Notes (Signed)
Social Work Patient ID: [name], female   DOB: [birth year], 48 y.o.   MRN: [id number] Met with pt and daughter to see how education went.  Both feel as long as they have the equipment they need she will do well at home. Aware of the recomendation of 24 hour care and plan to provide this.  Will add a hospital bed for pt.  She needs one due to her CHF and needed to elevate her bed and  Assist with her transfers now she has a CVA.  She also has chronic back pain and see's a pain MD for this.  She is unable to use a wedge for the elevation.  Pt is pleased how well The family education went and feels ready to go home tomorrow.  Pt's CNA is coming in at 3;00 to go through PT & OT-co-treat.  See how this goes.

## 2014-02-04 NOTE — Progress Notes (Signed)
Subjective/Complaints: Diarrhea after laxative yest Had been constipated Bladder working well  Review of Systems - Negative except Left weakness, chronic back pain  Objective: Vital Signs: Blood pressure 163/80, pulse 76, temperature 98.2 F (36.8 C), temperature source Oral, resp. rate 19, height 5\' 9"  (1.753 m), weight 121.2 kg (267 lb 3.2 oz), last menstrual period 12/21/2013, SpO2 93.00%. No results found. Results for orders placed during the hospital encounter of 01/15/14 (from the past 72 hour(s))  GLUCOSE, CAPILLARY     Status: Abnormal   Collection Time    02/01/14  7:31 AM      Result Value Ref Range   Glucose-Capillary 297 (*) 70 - 99 mg/dL  GLUCOSE, CAPILLARY     Status: Abnormal   Collection Time    02/01/14 11:18 AM      Result Value Ref Range   Glucose-Capillary 286 (*) 70 - 99 mg/dL  GLUCOSE, CAPILLARY     Status: Abnormal   Collection Time    02/01/14  4:33 PM      Result Value Ref Range   Glucose-Capillary 220 (*) 70 - 99 mg/dL  GLUCOSE, CAPILLARY     Status: Abnormal   Collection Time    02/01/14  8:50 PM      Result Value Ref Range   Glucose-Capillary 250 (*) 70 - 99 mg/dL   Comment 1 Notify RN    GLUCOSE, CAPILLARY     Status: Abnormal   Collection Time    02/02/14  7:16 AM      Result Value Ref Range   Glucose-Capillary 286 (*) 70 - 99 mg/dL   Comment 1 Notify RN    GLUCOSE, CAPILLARY     Status: Abnormal   Collection Time    02/02/14 11:52 AM      Result Value Ref Range   Glucose-Capillary 256 (*) 70 - 99 mg/dL   Comment 1 Notify RN    GLUCOSE, CAPILLARY     Status: Abnormal   Collection Time    02/02/14  4:58 PM      Result Value Ref Range   Glucose-Capillary 270 (*) 70 - 99 mg/dL  GLUCOSE, CAPILLARY     Status: Abnormal   Collection Time    02/02/14  9:35 PM      Result Value Ref Range   Glucose-Capillary 209 (*) 70 - 99 mg/dL  GLUCOSE, CAPILLARY     Status: Abnormal   Collection Time    02/03/14  7:28 AM      Result Value Ref Range    Glucose-Capillary 223 (*) 70 - 99 mg/dL   Comment 1 Notify RN    GLUCOSE, CAPILLARY     Status: Abnormal   Collection Time    02/03/14 11:10 AM      Result Value Ref Range   Glucose-Capillary 261 (*) 70 - 99 mg/dL   Comment 1 Notify RN    GLUCOSE, CAPILLARY     Status: Abnormal   Collection Time    02/03/14  1:53 PM      Result Value Ref Range   Glucose-Capillary 271 (*) 70 - 99 mg/dL  GLUCOSE, CAPILLARY     Status: Abnormal   Collection Time    02/03/14  4:40 PM      Result Value Ref Range   Glucose-Capillary 279 (*) 70 - 99 mg/dL   Comment 1 Notify RN    GLUCOSE, CAPILLARY     Status: Abnormal   Collection Time    02/03/14  8:56  PM      Result Value Ref Range   Glucose-Capillary 228 (*) 70 - 99 mg/dL      General: No acute distress Mood and affect are appropriate Heart: Regular rate and rhythm no rubs murmurs or extra sounds Lungs: Clear to auscultation, breathing unlabored, no rales or wheezes Abdomen: Positive bowel sounds, soft nontender to palpation, nondistended Extremities: No clubbing, cyanosis, or edema Skin: No evidence of breakdown, no evidence of rash Neurologic: Cranial nerves II through XII intact, motor strength is 5/5 in R deltoid, bicep, tricep, grip, hip flexor, knee extensors, ankle dorsiflexor and plantar flexor, 0/5 on Left side, 2/4 tone bicep, pecs on left  Musculoskeletal:  . No joint swelling   Assessment/Plan: 1. Functional deficits secondary to Right MCA infarct which require 3+ hours per day of interdisciplinary therapy in a comprehensive inpatient rehab setting. Physiatrist is providing close team supervision and 24 hour management of active medical problems listed below. Physiatrist and rehab team continue to assess barriers to discharge/monitor patient progress toward functional and medical goals. FIM: FIM - Bathing Bathing Steps Patient Completed: Chest;Left Arm;Abdomen;Right upper leg;Left upper leg;Right lower leg (including  foot);Front perineal area;Left lower leg (including foot) Bathing: 4: Min-Patient completes 8-9 71f 10 parts or 75+ percent  FIM - Upper Body Dressing/Undressing Upper body dressing/undressing steps patient completed: Thread/unthread right sleeve of pullover shirt/dresss;Put head through opening of pull over shirt/dress;Pull shirt over trunk;Thread/unthread left sleeve of pullover shirt/dress Upper body dressing/undressing: 5: Set-up assist to: Obtain clothing/put away (assist for positioning of LUE) FIM - Lower Body Dressing/Undressing Lower body dressing/undressing steps patient completed: Thread/unthread right underwear leg;Thread/unthread right pants leg;Don/Doff right sock;Don/Doff right shoe;Fasten/unfasten right shoe;Fasten/unfasten left shoe;Thread/unthread left underwear leg Lower body dressing/undressing: 3: Mod-Patient completed 50-74% of tasks  FIM - Toileting Toileting steps completed by patient: Performs perineal hygiene Toileting Assistive Devices: Grab bar or rail for support Toileting: 4: Steadying assist  FIM - Diplomatic Services operational officer Devices: Grab bars;Walker Toilet Transfers: 4-To toilet/BSC: Min A (steadying Pt. > 75%);4-From toilet/BSC: Min A (steadying Pt. > 75%)  FIM - Bed/Chair Transfer Bed/Chair Transfer Assistive Devices: Arm rests;Bed rails Bed/Chair Transfer: 3: Bed > Chair or W/C: Mod A (lift or lower assist);3: Chair or W/C > Bed: Mod A (lift or lower assist)  FIM - Locomotion: Wheelchair Distance: 180 Locomotion: Wheelchair: 5: Travels 150 ft or more: maneuvers on rugs and over door sills with supervision, cueing or coaxing FIM - Locomotion: Ambulation Locomotion: Ambulation Assistive Devices: Other (comment) (R hallway hand rail; L AFO) Ambulation/Gait Assistance: 3: Mod assist Locomotion: Ambulation: 1: Travels less than 50 ft with moderate assistance (Pt: 50 - 74%)  Comprehension Comprehension Mode: Auditory Comprehension:  5-Follows basic conversation/direction: With no assist  Expression Expression Mode: Verbal Expression: 5-Expresses complex 90% of the time/cues < 10% of the time  Social Interaction Social Interaction: 6-Interacts appropriately with others with medication or extra time (anti-anxiety, antidepressant).  Problem Solving Problem Solving: 5-Solves basic problems: With no assist  Memory Memory: 6-More than reasonable amt of time  Medical problem list 1.  CVA prophyllaxis ASA 325mg  Qd 2.  DVT prophyllaxis Heparin 5000U sq TID 3. CHF compensatedDigoxin, Lasix, toprol XL 4.  Urinary retention Urecholine QID,Flomax 5.  UTI keflex 6.  Spasticity Zanaflex--increase dosing today and observe for tolerance and effect 7. Pain control: reasonably managed just with tylenol. We discussed the fact that this shows her that she can do with less pain medication on an outpt basis. 8.  DM increaselantus  to 25 units LOS (Days) 20 A FACE TO FACE EVALUATION WAS PERFORMED  KIRSTEINS,ANDREW E 02/04/2014, 6:41 AM

## 2014-02-04 NOTE — Plan of Care (Signed)
Problem: RH Functional Use of Upper Extremity Goal: LTG Patient will use RT/LT upper extremity as a (OT) LTG: Patient will use right/left upper extremity as a stabilizer/gross assist/diminished/nondominant/dominant level with assist, with/without cues during functional activity (OT)  Outcome: Not Met (add Reason) Patient's tone in LUE preventing ability to use LUE as stabilizer

## 2014-02-04 NOTE — Progress Notes (Signed)
Pt has refused use of cpap at this time.  Rt spoke with pt about removing machine since she is being charged, and pt agreed.  RT left circuit and mask in case pt changes her mind.

## 2014-02-04 NOTE — Discharge Summary (Signed)
Discharge summary job # [id number]

## 2014-02-04 NOTE — Progress Notes (Signed)
Social Work Patient ID: [name], female   DOB: Jun 21, 1966, 48 y.o.   MRN: [id number] Pt requires a lightweight wheelchair for self propelling and for her self care tasks.  She is unable to self propel a standard weight wheelchair. Daughter to be here today for family education and Scientist, product/process development.  Will work on discharge form tomorrow.

## 2014-02-05 ENCOUNTER — Telehealth: Payer: Self-pay

## 2014-02-05 LAB — GLUCOSE, CAPILLARY: Glucose-Capillary: 228 mg/dL — ABNORMAL HIGH (ref 70–99)

## 2014-02-05 MED ORDER — ASPIRIN 325 MG PO TBEC: 325.0000 mg | DELAYED_RELEASE_TABLET | Freq: Every day | ORAL | Status: DC

## 2014-02-05 MED ORDER — DICLOFENAC SODIUM 1 % TD GEL: 2.0000 g | Freq: Four times a day (QID) | TRANSDERMAL | Status: DC | PRN

## 2014-02-05 MED ORDER — INSULIN GLARGINE 100 UNIT/ML ~~LOC~~ SOLN: 30.0000 [IU] | Freq: Every day | SUBCUTANEOUS | Status: DC

## 2014-02-05 MED ORDER — DIGOXIN 125 MCG PO TABS: 0.1250 mg | ORAL_TABLET | Freq: Every day | ORAL | Status: DC

## 2014-02-05 MED ORDER — TAB-A-VITE/IRON PO TABS: 1.0000 | ORAL_TABLET | Freq: Every day | ORAL | Status: DC

## 2014-02-05 MED ORDER — FUROSEMIDE 40 MG PO TABS: 40.0000 mg | ORAL_TABLET | Freq: Every day | ORAL | Status: DC

## 2014-02-05 MED ORDER — PANTOPRAZOLE SODIUM 40 MG PO TBEC: 40.0000 mg | DELAYED_RELEASE_TABLET | Freq: Every day | ORAL | Status: DC

## 2014-02-05 MED ORDER — SIMVASTATIN 40 MG PO TABS: 40.0000 mg | ORAL_TABLET | Freq: Every day | ORAL | Status: DC

## 2014-02-05 MED ORDER — TIZANIDINE HCL 4 MG PO TABS: 4.0000 mg | ORAL_TABLET | Freq: Every day | ORAL | Status: DC

## 2014-02-05 MED ORDER — CEPHALEXIN 250 MG PO CAPS: 250.0000 mg | ORAL_CAPSULE | Freq: Three times a day (TID) | ORAL | Status: DC

## 2014-02-05 MED ORDER — TIZANIDINE HCL 2 MG PO TABS: 2.0000 mg | ORAL_TABLET | Freq: Two times a day (BID) | ORAL | Status: DC

## 2014-02-05 MED ORDER — BETHANECHOL CHLORIDE 25 MG PO TABS: ORAL_TABLET | ORAL | Status: DC

## 2014-02-05 MED ORDER — CARBAMAZEPINE 200 MG PO TABS: 200.0000 mg | ORAL_TABLET | Freq: Two times a day (BID) | ORAL | Status: DC

## 2014-02-05 MED ORDER — TAMSULOSIN HCL 0.4 MG PO CAPS: 0.4000 mg | ORAL_CAPSULE | Freq: Every day | ORAL | Status: DC

## 2014-02-05 MED ORDER — METOPROLOL SUCCINATE ER 25 MG PO TB24: 25.0000 mg | ORAL_TABLET | Freq: Two times a day (BID) | ORAL | Status: DC

## 2014-02-05 MED ORDER — INSULIN GLARGINE 100 UNIT/ML ~~LOC~~ SOLN
30.0000 [IU] | Freq: Every day | SUBCUTANEOUS | Status: DC
  Filled 2014-02-05: qty 0.3

## 2014-02-05 NOTE — Telephone Encounter (Signed)
She says she did not get a prescription and there is no record of one being written.  [facility] shows last was 12/25/13 as well. We can handle this tomorrow during office hours.

## 2014-02-05 NOTE — Progress Notes (Signed)
Subjective/Complaints: No problems overnite Bladder working well  Review of Systems - Negative except Left weakness, chronic back pain  Objective: Vital Signs: Blood pressure 127/68, pulse 74, temperature 97.3 F (36.3 C), temperature source Oral, resp. rate 18, height 5\' 9"  (1.753 m), weight 121.2 kg (267 lb 3.2 oz), last menstrual period 12/21/2013, SpO2 97.00%. No results found. Results for orders placed during the hospital encounter of 01/15/14 (from the past 72 hour(s))  GLUCOSE, CAPILLARY     Status: Abnormal   Collection Time    02/02/14 11:52 AM      Result Value Ref Range   Glucose-Capillary 256 (*) 70 - 99 mg/dL   Comment 1 Notify RN    GLUCOSE, CAPILLARY     Status: Abnormal   Collection Time    02/02/14  4:58 PM      Result Value Ref Range   Glucose-Capillary 270 (*) 70 - 99 mg/dL  GLUCOSE, CAPILLARY     Status: Abnormal   Collection Time    02/02/14  9:35 PM      Result Value Ref Range   Glucose-Capillary 209 (*) 70 - 99 mg/dL  GLUCOSE, CAPILLARY     Status: Abnormal   Collection Time    02/03/14  7:28 AM      Result Value Ref Range   Glucose-Capillary 223 (*) 70 - 99 mg/dL   Comment 1 Notify RN    GLUCOSE, CAPILLARY     Status: Abnormal   Collection Time    02/03/14 11:10 AM      Result Value Ref Range   Glucose-Capillary 261 (*) 70 - 99 mg/dL   Comment 1 Notify RN    GLUCOSE, CAPILLARY     Status: Abnormal   Collection Time    02/03/14  1:53 PM      Result Value Ref Range   Glucose-Capillary 271 (*) 70 - 99 mg/dL  GLUCOSE, CAPILLARY     Status: Abnormal   Collection Time    02/03/14  4:40 PM      Result Value Ref Range   Glucose-Capillary 279 (*) 70 - 99 mg/dL   Comment 1 Notify RN    GLUCOSE, CAPILLARY     Status: Abnormal   Collection Time    02/03/14  8:56 PM      Result Value Ref Range   Glucose-Capillary 228 (*) 70 - 99 mg/dL  GLUCOSE, CAPILLARY     Status: Abnormal   Collection Time    02/04/14  7:47 AM      Result Value Ref Range   Glucose-Capillary 211 (*) 70 - 99 mg/dL  GLUCOSE, CAPILLARY     Status: Abnormal   Collection Time    02/04/14 12:03 PM      Result Value Ref Range   Glucose-Capillary 240 (*) 70 - 99 mg/dL  GLUCOSE, CAPILLARY     Status: Abnormal   Collection Time    02/04/14  4:52 PM      Result Value Ref Range   Glucose-Capillary 160 (*) 70 - 99 mg/dL  GLUCOSE, CAPILLARY     Status: Abnormal   Collection Time    02/04/14  8:51 PM      Result Value Ref Range   Glucose-Capillary 223 (*) 70 - 99 mg/dL      General: No acute distress Mood and affect are appropriate Heart: Regular rate and rhythm no rubs murmurs or extra sounds Lungs: Clear to auscultation, breathing unlabored, no rales or wheezes Abdomen: Positive bowel sounds, soft  nontender to palpation, nondistended Extremities: No clubbing, cyanosis, or edema Skin: No evidence of breakdown, no evidence of rash Neurologic: Cranial nerves II through XII intact, motor strength is 5/5 in R deltoid, bicep, tricep, grip, hip flexor, knee extensors, ankle dorsiflexor and plantar flexor, 0/5 on Left side, 2/4 tone bicep, pecs on left  Musculoskeletal:  . No joint swelling   Assessment/Plan: 1. Functional deficits secondary to Right MCA infarct  Stable for D/C today F/u PCP in 1-2 weeks F/u PM&R 3 weeks See D/C summary See D/C instructionsFIM: FIM - Bathing Bathing Steps Patient Completed: Chest;Left Arm;Abdomen;Right upper leg;Left upper leg;Right lower leg (including foot);Front perineal area;Left lower leg (including foot) Bathing: 4: Min-Patient completes 8-9 13f 10 parts or 75+ percent  FIM - Upper Body Dressing/Undressing Upper body dressing/undressing steps patient completed: Thread/unthread right sleeve of pullover shirt/dresss;Put head through opening of pull over shirt/dress;Thread/unthread left sleeve of pullover shirt/dress Upper body dressing/undressing: 4: Min-Patient completed 75 plus % of tasks FIM - Lower Body  Dressing/Undressing Lower body dressing/undressing steps patient completed: Thread/unthread right underwear leg;Thread/unthread right pants leg;Don/Doff right sock;Don/Doff right shoe;Fasten/unfasten right shoe;Fasten/unfasten left shoe;Thread/unthread left underwear leg;Thread/unthread left pants leg (required assist for positioning of LLE) Lower body dressing/undressing: 3: Mod-Patient completed 50-74% of tasks  FIM - Toileting Toileting steps completed by patient: Adjust clothing prior to toileting;Performs perineal hygiene Toileting Assistive Devices: Grab bar or rail for support Toileting: 3: Mod-Patient completed 2 of 3 steps  FIM - Diplomatic Services operational officer Devices: Grab bars;Walker Toilet Transfers: 4-To toilet/BSC: Min A (steadying Pt. > 75%);4-From toilet/BSC: Min A (steadying Pt. > 75%)  FIM - Bed/Chair Transfer Bed/Chair Transfer Assistive Devices: Arm rests Bed/Chair Transfer: 4: Chair or W/C > Bed: Min A (steadying Pt. > 75%);4: Bed > Chair or W/C: Min A (steadying Pt. > 75%);4: Sit > Supine: Min A (steadying pt. > 75%/lift 1 leg);5: Supine > Sit: Supervision (verbal cues/safety issues)  FIM - Locomotion: Wheelchair Distance: 150 Locomotion: Wheelchair: 5: Travels 150 ft or more: maneuvers on rugs and over door sills with supervision, cueing or coaxing FIM - Locomotion: Ambulation Locomotion: Ambulation Assistive Devices: Walker - Rolling;Orthosis (L AFO, L hand orthosis) Ambulation/Gait Assistance: 3: Mod assist Locomotion: Ambulation: 1: Travels less than 50 ft with moderate assistance (Pt: 50 - 74%)  Comprehension Comprehension Mode: Auditory Comprehension: 6-Follows complex conversation/direction: With extra time/assistive device  Expression Expression Mode: Verbal Expression: 5-Expresses complex 90% of the time/cues < 10% of the time  Social Interaction Social Interaction: 6-Interacts appropriately with others with medication or extra time  (anti-anxiety, antidepressant).  Problem Solving Problem Solving: 5-Solves complex 90% of the time/cues < 10% of the time  Memory Memory: 6-More than reasonable amt of time  Medical problem list 1.  CVA prophyllaxis ASA 325mg  Qd 2.  DVT prophyllaxis Heparin 5000U sq TID-D/C 3. CHF compensatedDigoxin, Lasix, toprol XL 4.  Urinary retention Urecholine QID,Flomax 5.  UTI keflex 6.  Spasticity Zanaflex--increase dosing today and observe for tolerance and effect 7. Pain control: reasonably managed just with tylenol. We discussed the fact that this shows her that she can do with less pain medication on an outpt basis. 8.  DM increase lantus to 30 units LOS (Days) 21 A FACE TO FACE EVALUATION WAS PERFORMED  [name] 02/05/2014, 7:45 AM

## 2014-02-05 NOTE — Telephone Encounter (Signed)
This was handled in hospital. She should have enough to make it to next month

## 2014-02-05 NOTE — Telephone Encounter (Signed)
Calling about her percocet 10 325 # 70  1 q 8hr prn.  Last fill was 12/25/13 and was in the hospital this month.  Jesusita Oka did not put this on the discharge summary and it has dropped off her med list.  Do you want to refill? She has an upcoming appt 02/19/14

## 2014-02-05 NOTE — Discharge Summary (Signed)
[name], OLENICK NO.:  [id number]  MEDICAL RECORD NO.:  [id number]  LOCATION:  [id number]                        FACILITY:  [facility]  PHYSICIAN:  Erick Colace, M.D.DATE OF BIRTH:  06/26/1966  DATE OF ADMISSION:  01/15/2014 DATE OF DISCHARGE:  02/05/2014                              DISCHARGE SUMMARY   DISCHARGE DIAGNOSES: 1. 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 2. Subcutaneous heparin for DVT prophylaxis. 3. Systolic congestive heart failure. 4. Urinary tract infection. 5. Diabetes mellitus with peripheral neuropathy.  HISTORY OF PRESENT ILLNESS:  This is a 48 year old right-handed female with history of uncontrolled hypertension, diabetes mellitus, nonischemic cardiomyopathy, coronary artery disease with pacemaker as well as [facility]  Admitted on January 09, 2014 with left-sided weakness and slurred speech.  The patient was previously admitted on October 07, 2013, with similar symptoms.  Workup negative as per Neurology Services.  Cranial CT scan consistent with possible right middle cerebral artery territory infarct. MRI not completed secondary to pacemaker.  The patient did not receive tPA.  CTA of the head negative for hemorrhage, did show occluded branch MCA vessel.  CTA of the neck negative.  Echocardiogram with ejection fraction 40%, no wall motion abnormalities.  TEE showed moderately depressed LVEF without thrombus or vegetation.  Maintained on aspirin for CVA prophylaxis.  Subcutaneous heparin for DVT prophylaxis.  The patient was admitted for comprehensive rehab program.  PAST MEDICAL HISTORY:  See discharge diagnoses.  SOCIAL HISTORY:  Lives with family.  FUNCTIONAL HISTORY:  Prior to admission, independent.  Functional status upon admission to rehab services; +2 total assist upper body dressing, +2 total assist lower body dressing.  PHYSICAL EXAMINATION:  VITAL  SIGNS:  Blood pressure 139/92, pulse 90, temperature 98, respirations 20. GENERAL:  This was an alert female, dysarthric speech, but intelligible, oriented x3.  Left facial droop. HEENT:  Pupils were round and reactive to light. LUNGS:  Clear to auscultation. CARDIAC:  Regular rate and rhythm. ABDOMEN:  Soft, nontender.  Good bowel sounds.  REHABILITATION HOSPITAL COURSE:  The patient was admitted to inpatient rehab services with therapies initiated on a 3-hour daily basis consisting of physical therapy, occupational therapy, speech therapy, and rehabilitation nursing.  The following issues were addressed during the patient's rehabilitation stay.  [name] cardioembolic right MCA territory infarct remained stable, maintained on aspirin therapy. Chronic pain syndrome, maintained on Tegretol as prior to admission, as well as Zanaflex.  She did have a history of diabetes mellitus, peripheral neuropathy, hemoglobin A1c 10.7.  She remained on Lantus insulin with diabetic teaching.  Blood pressures monitored on Lasix as well as Toprol and Lanoxin.  No orthostasis reported.  Chronic diastolic heart failure with Lasix as advised, exhibited no signs of fluid overload.  The patient received weekly collaborative interdisciplinary team conferences to discuss estimated length of stay, family teaching, and any barriers to discharge.  Wheelchair mobility with supervision. She had a left AFO brace.  The patient performs sit-to-stand with the use of right upper extremity and rolling walker x3.  Squat pivot transfers to wheelchair with moderate assist to the left side.  Sit to stand in parallel bars  with moderate assist with many squats x10. Activities of daily living, therapies focused on sit to stand and standing balance to continued donning her briefs and pants.  Full family teaching was completed.  Plan the patient discharged to home.  DISCHARGE MEDICATIONS: 1. Aspirin 325 mg p.o. daily. 2.  Tegretol 200 mg p.o. b.i.d. 3. Keflex 250 mg p.o. every 8 hours for urinary tract infection of E.     coli.  She was tapered from Urecholine. 4. Lanoxin 0.125 mg p.o. daily. 5. Lasix 40 mg p.o. daily. 6. Lantus insulin 25 units subcutaneous at bedtime. 7. Toprol-XL 25 mg p.o. b.i.d. 8. Multivitamin daily. 9. Protonix 40 mg p.o. daily. 10.Senokot-S 2 tablets b.i.d. hold for loose stool. 11.Zocor 40 mg p.o. daily. 12.Flomax 0.4 mg at bedtime. 13.Zanaflex 2 mg p.o. b.i.d. and 4 mg at bedtime.  DIET:  Diabetic diet.  SPECIAL INSTRUCTIONS:  The patient will follow up with Dr. Claudette Laws at the outpatient rehab center as advised; Dr. Sunny Schlein. Sethi 1 month call for appointment; and Dr. Lars Masson medical management.     [name], P.A.   ______________________________ Erick Colace, M.D.    DA/MEDQ  D:  02/04/2014  T:  02/05/2014  Job:  [id number]  cc:   Pramod P. Pearlean Brownie, MD Dr. Lars Masson

## 2014-02-05 NOTE — Progress Notes (Signed)
Patient discharged to home with belongings and family. Discharge instructions and prescriptions received from Deatra Ina, PA with verbal understanding. Patient verbalized to this RN, she will be able to give herself insulin, with the assistance of family drawing up the insulin.

## 2014-02-05 NOTE — Progress Notes (Signed)
Recreational Therapy Discharge Summary Patient Details  Name: [name] MRN: [id number] Date of Birth: January 28, 1966 Today's Date: 02/05/2014  Long term goals set: 1  Long term goals met: 1  Comments on progress toward goals: Pt is discharging home today with family to provide/coordinate 24 hour supervision/assist.  Pt is supervision/set up assist with min cues for attention to the left with activity for simple tasks w/c level.  Pt has identified potential activities of choice for safe participation post discharge and is motivated to regain further independence. Reasons for discharge: discharge from hospital Patient/family agrees with progress made and goals achieved: Yes  Khalee Mazo 02/05/2014, 8:34 AM

## 2014-02-05 NOTE — Progress Notes (Signed)
Social Work Discharge Note Discharge Note  The overall goal for the admission was met for:   Discharge location: Yes-HOME WITH FAMILY 24 HR CARE  Length of Stay: Yes-21 DAYS  Discharge activity level: Yes-MIN LEVEL  Home/community participation: Yes  Services provided included: MD, RD, PT, OT, SLP, RN, CM, TR, Pharmacy, Neuropsych and SW  Financial Services: Medicaid  Follow-up services arranged: Home Health: [facility]-PT,OT,RN,SPT,SW, DME: [facility]-HOSPITAL BED,WHEELCHAIR, WIDE ROLLING WALKER, TUB BENCH and Patient/Family request agency HH: [facility], DME: [facility]  Comments (or additional information):FAMILY EDUCATION ONLY COMPLETED WITH DAUGHTER, SON NEVER WENT THROUGH THERAPIES WITH PT ALSO [facility] NEVER SHOWED UP YESTERDAY FOR TRAINING. TEAM VOICED SEVERAL CONCERNS REGARDING PT'S DISCHARGE AND THE AMOUNT OF CARE PT REQUIRES.  PT IS COMPETENT AND ABLE TO MAKE HER OWN DECISIONS AND HAS DECIDED TO GO HOME AND NOT TO  [facility].  SHE AND DAUGHTER REPORTS SHE WILL HAVE 24 HOUR CARE AT HOME.  HAVE PLACED ON CAPS WAITING LIST AND WILL RESUME PCS SERVICES WITH [facility] PT AWARE OF THE TEAM'S CONCERNS AND STILL PLANNING TO GO HOME  Patient/Family verbalized understanding of follow-up arrangements: Yes  Individual responsible for coordination of the follow-up plan: SELF & DREAKA-DAUGHTER  Confirmed correct DME delivered: Elease Hashimoto 02/05/2014    Elease Hashimoto

## 2014-02-05 NOTE — Telephone Encounter (Signed)
I asked [name] to give her a new rx. He never told me he didn't or that he couldn't find her.

## 2014-02-05 NOTE — Telephone Encounter (Signed)
Patient called to get her medication refill.

## 2014-02-06 NOTE — Telephone Encounter (Signed)
Left VM for Ms Cranor to give [place] a call.

## 2014-02-08 ENCOUNTER — Telehealth: Payer: Self-pay

## 2014-02-08 NOTE — Telephone Encounter (Signed)
[facility] called and stated patient's metoprolol needs a prior authorization.

## 2014-02-11 ENCOUNTER — Telehealth: Payer: Self-pay

## 2014-02-11 NOTE — Telephone Encounter (Signed)
Becky with [facility] called to clarify the quantity for the cephalexin rx.  Original request was for 3 capsules.  Is this correct?

## 2014-02-12 ENCOUNTER — Encounter: Payer: Medicaid Other | Admitting: Internal Medicine

## 2014-02-12 ENCOUNTER — Telehealth: Payer: Self-pay

## 2014-02-12 NOTE — Telephone Encounter (Signed)
[facility] called requesting quantity clarification on rx for cephalexin.  Rx only has #3.  Please advise.

## 2014-02-12 NOTE — Telephone Encounter (Signed)
Pharmacy aware #3 is correct quantity since patient is finishing rx.

## 2014-02-12 NOTE — Telephone Encounter (Signed)
#  3 should be correct the patient was just finishing up what was started in the hospital

## 2014-02-14 ENCOUNTER — Encounter: Payer: Self-pay | Admitting: Internal Medicine

## 2014-02-15 ENCOUNTER — Telehealth: Payer: Self-pay | Admitting: *Deleted

## 2014-02-15 NOTE — Telephone Encounter (Signed)
Pt's HHN, stephanie calls and states she visited pt for first time today since disch in feb w/ cva. Pt is on telemonitoring through [facility]. Her blood pressures have consistently been elevated, today 156/102 hr 108, blood sugars over 300. Pt does not c/o h/a, N&V, shortness of breath, chest pain, dizziness. Have given her an appt Monday am at 0945 dr Dorise Hiss. She is arranging transportation. Please advise

## 2014-02-15 NOTE — Telephone Encounter (Signed)
BP on rehab D/C was 139/92. She is asymptomatic. To ED over weekend if she changes. Otherwise, [facility] appt.

## 2014-02-18 ENCOUNTER — Ambulatory Visit: Payer: Medicaid Other | Admitting: Internal Medicine

## 2014-02-19 ENCOUNTER — Encounter: Payer: Medicaid Other | Attending: Physical Medicine & Rehabilitation | Admitting: Physical Medicine & Rehabilitation

## 2014-02-19 ENCOUNTER — Encounter: Payer: Self-pay | Admitting: Physical Medicine & Rehabilitation

## 2014-02-19 VITALS — BP 157/92 | HR 101 | Resp 14 | Ht 69.0 in | Wt 267.0 lb

## 2014-02-19 DIAGNOSIS — E1142 Type 2 diabetes mellitus with diabetic polyneuropathy: Secondary | ICD-10-CM | POA: Insufficient documentation

## 2014-02-19 DIAGNOSIS — I639 Cerebral infarction, unspecified: Secondary | ICD-10-CM

## 2014-02-19 DIAGNOSIS — M47817 Spondylosis without myelopathy or radiculopathy, lumbosacral region: Secondary | ICD-10-CM | POA: Insufficient documentation

## 2014-02-19 DIAGNOSIS — I5042 Chronic combined systolic (congestive) and diastolic (congestive) heart failure: Secondary | ICD-10-CM | POA: Insufficient documentation

## 2014-02-19 DIAGNOSIS — F329 Major depressive disorder, single episode, unspecified: Secondary | ICD-10-CM | POA: Insufficient documentation

## 2014-02-19 DIAGNOSIS — I509 Heart failure, unspecified: Secondary | ICD-10-CM | POA: Insufficient documentation

## 2014-02-19 DIAGNOSIS — Z79899 Other long term (current) drug therapy: Secondary | ICD-10-CM | POA: Insufficient documentation

## 2014-02-19 DIAGNOSIS — G8110 Spastic hemiplegia affecting unspecified side: Secondary | ICD-10-CM

## 2014-02-19 DIAGNOSIS — G8114 Spastic hemiplegia affecting left nondominant side: Secondary | ICD-10-CM

## 2014-02-19 DIAGNOSIS — E1165 Type 2 diabetes mellitus with hyperglycemia: Secondary | ICD-10-CM

## 2014-02-19 DIAGNOSIS — IMO0001 Reserved for inherently not codable concepts without codable children: Secondary | ICD-10-CM | POA: Insufficient documentation

## 2014-02-19 DIAGNOSIS — M1710 Unilateral primary osteoarthritis, unspecified knee: Secondary | ICD-10-CM | POA: Insufficient documentation

## 2014-02-19 DIAGNOSIS — Z5181 Encounter for therapeutic drug level monitoring: Secondary | ICD-10-CM | POA: Insufficient documentation

## 2014-02-19 DIAGNOSIS — I6350 Cerebral infarction due to unspecified occlusion or stenosis of unspecified cerebral artery: Secondary | ICD-10-CM

## 2014-02-19 DIAGNOSIS — E1149 Type 2 diabetes mellitus with other diabetic neurological complication: Secondary | ICD-10-CM | POA: Insufficient documentation

## 2014-02-19 DIAGNOSIS — F3289 Other specified depressive episodes: Secondary | ICD-10-CM | POA: Insufficient documentation

## 2014-02-19 DIAGNOSIS — E1140 Type 2 diabetes mellitus with diabetic neuropathy, unspecified: Secondary | ICD-10-CM

## 2014-02-19 DIAGNOSIS — IMO0002 Reserved for concepts with insufficient information to code with codable children: Secondary | ICD-10-CM

## 2014-02-19 MED ORDER — TIZANIDINE HCL 4 MG PO TABS: 4.0000 mg | ORAL_TABLET | Freq: Three times a day (TID) | ORAL | Status: DC

## 2014-02-19 MED ORDER — INSULIN GLARGINE 100 UNIT/ML ~~LOC~~ SOLN: SUBCUTANEOUS | Status: DC

## 2014-02-19 MED ORDER — OXYCODONE-ACETAMINOPHEN 10-325 MG PO TABS
1.0000 | ORAL_TABLET | ORAL | Status: DC | PRN
Start: 2014-02-19 — End: 2014-02-19

## 2014-02-19 MED ORDER — OXYCODONE-ACETAMINOPHEN 10-325 MG PO TABS: 1.0000 | ORAL_TABLET | Freq: Four times a day (QID) | ORAL | Status: DC | PRN

## 2014-02-19 NOTE — Patient Instructions (Signed)
Continue to work on your sugars. Your target should be 100-150

## 2014-02-19 NOTE — Progress Notes (Signed)
Subjective:    Patient ID: [name], female    DOB: Apr 25, 1966, 48 y.o.   MRN: [id number]  HPI  [name] is back regarding her chronic pain and recent right MCA infarct. She has been walking more at home using her walker, cane, w/c, family.  Therapy is really just getting started given all the inclement weather.   Her spasms are better but she still has resting tone. She remains on the zanaflex 2mg  bid and 4mg  qhs.  She has ongoing pain in her back/shoulder. Her right knee is bothering her more as well.   Her bladder is overactive, but she's still on the urecholine. She is having constipation. She is using occasional MOM.       Pain Inventory Average Pain 8 Pain Right Now 5 My pain is tingling  In the last 24 hours, has pain interfered with the following? General activity 6 Relation with others 6 Enjoyment of life 6 What TIME of day is your pain at its worst? evening Sleep (in general) Fair  Pain is worse with: sitting Pain improves with: pacing activities Relief from Meds: 6  Mobility walk with assistance use a cane use a walker how many minutes can you walk? 5 ability to climb steps?  no do you drive?  no use a wheelchair needs help with transfers Do you have any goals in this area?  yes  Function retired I need assistance with the following:  dressing, bathing, toileting, meal prep and household duties Do you have any goals in this area?  yes  Neuro/Psych bladder control problems bowel control problems weakness numbness trouble walking spasms depression  Prior Studies Any changes since last visit?  yes  Physicians involved in your care Any changes since last visit?  no   Family History  Problem Relation Age of Onset  . Heart disease Mother 73    Died of MI at age 15 yo  . Heart disease Paternal Grandmother     requiring pacemaker.  45 Heart disease Paternal Grandfather 71    Died of MI at possibly age 42-53yo  . Stroke Paternal  Grandfather   . Heart disease Father 73    MI age 36yo requiring stenting  . Kidney disease Mother     requiring dialysis  . Congestive Heart Failure Mother   . Diabetes Father   . Diabetes Brother   . Heart disease Brother 70    MI at age 72 years old  . Breast cancer Paternal Aunt   . Breast cancer Maternal Grandmother    History   Social History  . Marital Status: Single    Spouse Name: N/A    Number of Children: 2  . Years of Education: 9th grade   Occupational History  . Unemployed     planning on getting disability   Social History Main Topics  . Smoking status: Never Smoker   . Smokeless tobacco: Never Used  . Alcohol Use: No  . Drug Use: No  . Sexual Activity: Yes    Birth Control/ Protection: Surgical   Other Topics Concern  . None   Social History Narrative   Lives in [place] with her son. Is able to read and write fluently in 30.   Past Surgical History  Procedure Laterality Date  . Breast biopsies Bilateral 2011    patient reports benign results  . Tubal ligation  05/31/1985  . Hand surgery Left   . Shoulder surgery Right   . Dental surgery  tumors removed   . Cardiac catheterization  05/2011  . Pacemaker/defibrillator placed  08/22/13 Left 08/22/13  . Tee without cardioversion N/A 01/14/2014    Procedure: TRANSESOPHAGEAL ECHOCARDIOGRAM (TEE);  Surgeon: Thurmon Fair, MD;  Location: [facility] ENDOSCOPY;  Service: Cardiovascular;  Laterality: N/A;   Past Medical History  Diagnosis Date  . Hypertension     Poorly controlled. Has had HTN since age 31. Angioedema with ACEI.  24 Hr urine and renal arterial dopplers ordered . . . Never done  . Type II or unspecified type diabetes mellitus without mention of complication, uncontrolled DX: 2002  . NICM (nonischemic cardiomyopathy)     EF 45-50% in 8/12, cath 6/12 showed normal coronaries, EF 50-55% by LV gram  . Morbid obesity   . Allergic rhinitis   . HLD (hyperlipidemia)   . OSA on CPAP     sleep  study in 8/12 showed moderate to severe OSA requiring CPAP  . Chronic lower back pain     secondary to DJD, obsetiy, hip problems. Followed by Dr. Ivory Broad (pain management)  . 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 Chronic diastolic heart failure      Primarily diastolic CHF: Likely due to uncontrolled HTN. Last echo (8/12) with EF 45-50%, mild to moderate LVH with some asymmetric septal hypertrophy, RV normal size and systolic function. EF 50-55% by LV-gram in 6/12.   . Degeneration of lumbar or lumbosacral intervertebral disc   . Polyneuropathy in diabetes(357.2)   . Thoracic or lumbosacral neuritis or radiculitis, unspecified   . Calcifying tendinitis of shoulder   . LBBB (left bundle branch block)   . Chronic combined systolic and diastolic CHF (congestive heart failure)     EF 40-45% by echo 12/06/2012  . Coronary artery disease     questionable. LHC 05/2011 showing normal coronaries // Followed at [facility], Dr. Shirlee Latch  . GERD (gastroesophageal reflux disease)    BP 157/92  Pulse 101  Resp 14  Ht 5\' 9"  (1.753 m)  Wt 267 lb (121.11 kg)  BMI 39.41 kg/m2  SpO2 95%  Opioid Risk Score:   Fall Risk Score: High Fall Risk (>13 points) (patient educated handout given)   Review of Systems  Constitutional: Positive for unexpected weight change.  Respiratory: Positive for shortness of breath.   Cardiovascular: Positive for leg swelling.  Gastrointestinal: Positive for nausea and constipation.  Genitourinary: Positive for difficulty urinating.  Musculoskeletal: Positive for back pain.  Neurological: Positive for weakness and numbness.  Psychiatric/Behavioral: Positive for dysphoric mood.  All other systems reviewed and are negative.       Objective:   Physical Exam  Constitutional: She is oriented to person, place, and time. She appears well-developed and well-nourished.  Obese --no change  HENT:  Head: Normocephalic and atraumatic.  Nose:  Nose normal.  Eyes: Conjunctivae and EOM are normal. Pupils are equal, round, and reactive to light.  Neck: Normal range of motion.  Cardiovascular: Normal rate and regular rhythm. Defib/pacer left upper chest wall, site clean  Pulmonary/Chest: Effort normal and breath sounds normal.  Abdominal: She exhibits no distension. There is no tenderness.  Musculoskeletal: Patient had pain with cervical movements particularly rotation and bending to the left. Left shoulder is painful with RTC maneuvers, apprehension test is negative. Pacer site A little tender. Lumbar back: She exhibits decreased range of motion, tenderness, bony tenderness, pain and spasm She was unable to flex to 40 degrees and extend past 10  degrees. She struggled to stand after sitting. 1+ edema both lower ext.  Mild joint line tenderness bilaterally. She has bilateral valgus deformities at each knee, right more than left.  Both calves are tender with stretching and slightly with palpation.    Neurological: She is alert and oriented to person, place, and time. Left upper trace at bicep and deltoid. 0/5 elsewhere in UE and LLE. Tone 2/4 bicep trace at pec, trace to absent in LE. DTR's 3+ on left. Left central 7 and tongue deviation. Speech slightly dysarthric. Intelligence normal. sensory deficit (stocking glove both legs) is present. She displays a negative Romberg sign.  Psychiatric: She has a normal mood and affect. Her behavior is normal. Judgment and thought content normal.    Assessment & Plan:   ASSESSMENT:  1. Chronic low back pain which is multifactorial. Recent MRI of the  back is notable for disc disease and facet hypertrophy. Most  involved level appeared to be L4-L5, where there is a shallow broad-  based disk herniation and facet arthropathy with mild stenosis.  2. Diabetic peripheral neuropathy.  3. Bilateral rotator cuff syndrome.  4. Recent Right MCA infarct 5. Cardiomyopathy/HTN. It is quite obvious that she's  feeling better given her weight loss and pacer.  6. 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  7. Morbid obesity  PLAN:  1. Continuie tegretol at 200mg  bid for neuropathic pain.  2. Percocet 10/325 was refilled one q8 prn. #90.  3. Continue rehab efforts. She is making progress.   4. Increase zanaflex to 4mg  TID. Continue rom and stretching 5. Cardiac mgt per Dr. 05-16-1982, et al.  6. Continue Voltaren gel for knees.  Advised ice for right knee and left shoulder. Needs to work on support and proper technique with these areas also.  7. I will see her back in 1 months' time. She will call me with any  problems or questions. 30 minutes of face to face patient care time were spent during this visit. All questions were encouraged and answered.  8. Stop urecholine.

## 2014-02-20 ENCOUNTER — Telehealth: Payer: Self-pay

## 2014-02-20 MED ORDER — CYCLOBENZAPRINE HCL 10 MG PO TABS: 10.0000 mg | ORAL_TABLET | Freq: Three times a day (TID) | ORAL | Status: DC

## 2014-02-20 NOTE — Telephone Encounter (Signed)
Patient is requesting flexeril refill.  Please advise.

## 2014-02-20 NOTE — Telephone Encounter (Signed)
Ok. Please send it there. Thanks

## 2014-02-20 NOTE — Telephone Encounter (Signed)
Flexeril refill should be sent to [facility].

## 2014-02-21 DIAGNOSIS — I10 Essential (primary) hypertension: Secondary | ICD-10-CM

## 2014-02-21 DIAGNOSIS — I69959 Hemiplegia and hemiparesis following unspecified cerebrovascular disease affecting unspecified side: Secondary | ICD-10-CM

## 2014-02-21 DIAGNOSIS — I69922 Dysarthria following unspecified cerebrovascular disease: Secondary | ICD-10-CM

## 2014-02-21 DIAGNOSIS — E1149 Type 2 diabetes mellitus with other diabetic neurological complication: Secondary | ICD-10-CM

## 2014-02-22 ENCOUNTER — Telehealth: Payer: Self-pay

## 2014-02-22 NOTE — Telephone Encounter (Signed)
meds are correct as written

## 2014-02-22 NOTE — Telephone Encounter (Signed)
[facility] called to verify directions on Tizanidine. Pharmacist said patient was previously taking 1 tablet at bedtime.  They also wanted to verify that patient is to take 45 total units of Lantus. She was only taking 27 units of Salastar. Please advise.

## 2014-02-22 NOTE — Telephone Encounter (Signed)
Contacted [facility] to inform them per Dr. Riley Kill the meds are correct as written.

## 2014-02-25 ENCOUNTER — Telehealth: Payer: Self-pay

## 2014-02-25 NOTE — Telephone Encounter (Signed)
FYI:: [name] @ [facility] called to inform you that she faxed patient's results from [facility] to clinic. Patient is also having elevated BP's (158/113,156/119,152/98) and her weight is fluctuating between 236 lb to 250 lb. Please advise.

## 2014-02-25 NOTE — Telephone Encounter (Signed)
swartz pt 

## 2014-02-26 ENCOUNTER — Telehealth: Payer: Self-pay

## 2014-02-26 NOTE — Telephone Encounter (Signed)
Refill request form [facility] for tamsulosin 0.4 mg, please advise.

## 2014-02-26 NOTE — Telephone Encounter (Signed)
For now, I recommend increasing metoprolol to 50mg  bid (as long as pulse is greater than 70).  Probably needs more lasix, but this needs to be handled by cardiology/heart failure team which she sees regularly.----Dr. al

## 2014-02-26 NOTE — Telephone Encounter (Signed)
Attempted to contacted [name] @ [facility] left a voicemail to return call to clinic.

## 2014-02-26 NOTE — Telephone Encounter (Signed)
May refill x3

## 2014-02-27 MED ORDER — TAMSULOSIN HCL 0.4 MG PO CAPS: 0.4000 mg | ORAL_CAPSULE | Freq: Every day | ORAL | Status: DC

## 2014-02-27 NOTE — Telephone Encounter (Signed)
Tamsulosin 0.4 mg refills sent to [facility].

## 2014-02-28 ENCOUNTER — Other Ambulatory Visit: Payer: Self-pay | Admitting: *Deleted

## 2014-02-28 ENCOUNTER — Encounter: Payer: Medicaid Other | Admitting: *Deleted

## 2014-02-28 DIAGNOSIS — I639 Cerebral infarction, unspecified: Secondary | ICD-10-CM

## 2014-02-28 DIAGNOSIS — N3946 Mixed incontinence: Secondary | ICD-10-CM

## 2014-03-04 ENCOUNTER — Telehealth: Payer: Self-pay

## 2014-03-04 ENCOUNTER — Encounter: Payer: Self-pay | Admitting: *Deleted

## 2014-03-04 NOTE — Telephone Encounter (Signed)
Patient called and stated she is having increased pain on her left side since she has had a CVA in Jan. She would like someone to call her back regarding this.

## 2014-03-06 NOTE — Telephone Encounter (Signed)
[facility] called to clarify if patient should be taking cyclobenzaprine or tizanidine.  Please advise.

## 2014-03-06 NOTE — Telephone Encounter (Signed)
tizanidine

## 2014-03-06 NOTE — Telephone Encounter (Signed)
Contacted [facility] to inform them that patient should be taking Tizanidine and not the Flexeril per Dr. Riley Kill.

## 2014-03-07 ENCOUNTER — Telehealth: Payer: Self-pay

## 2014-03-07 NOTE — Telephone Encounter (Signed)
Patient called about her Lantus. Attempted to contact patient to get more information. Left a voicemail for patient to return call to the clinic.

## 2014-03-18 ENCOUNTER — Emergency Department (HOSPITAL_COMMUNITY)
Admission: EM | Admit: 2014-03-18 | Discharge: 2014-03-19 | Disposition: A | Discharge status: Home / Self Care | Payer: Medicaid Other | Attending: Emergency Medicine | Admitting: Emergency Medicine

## 2014-03-18 ENCOUNTER — Emergency Department (HOSPITAL_COMMUNITY): Payer: Medicaid Other

## 2014-03-18 ENCOUNTER — Encounter (HOSPITAL_COMMUNITY): Payer: Self-pay | Admitting: Emergency Medicine

## 2014-03-18 ENCOUNTER — Encounter: Payer: Self-pay | Admitting: *Deleted

## 2014-03-18 DIAGNOSIS — W050XXA Fall from non-moving wheelchair, initial encounter: Secondary | ICD-10-CM | POA: Insufficient documentation

## 2014-03-18 DIAGNOSIS — Z794 Long term (current) use of insulin: Secondary | ICD-10-CM | POA: Insufficient documentation

## 2014-03-18 DIAGNOSIS — Y939 Activity, unspecified: Secondary | ICD-10-CM | POA: Insufficient documentation

## 2014-03-18 DIAGNOSIS — Y9389 Activity, other specified: Secondary | ICD-10-CM | POA: Insufficient documentation

## 2014-03-18 DIAGNOSIS — Z79899 Other long term (current) drug therapy: Secondary | ICD-10-CM | POA: Insufficient documentation

## 2014-03-18 DIAGNOSIS — Z9889 Other specified postprocedural states: Secondary | ICD-10-CM | POA: Insufficient documentation

## 2014-03-18 DIAGNOSIS — M79642 Pain in left hand: Secondary | ICD-10-CM

## 2014-03-18 DIAGNOSIS — Z7982 Long term (current) use of aspirin: Secondary | ICD-10-CM | POA: Insufficient documentation

## 2014-03-18 DIAGNOSIS — I2510 Atherosclerotic heart disease of native coronary artery without angina pectoris: Secondary | ICD-10-CM | POA: Insufficient documentation

## 2014-03-18 DIAGNOSIS — K219 Gastro-esophageal reflux disease without esophagitis: Secondary | ICD-10-CM | POA: Insufficient documentation

## 2014-03-18 DIAGNOSIS — Z950 Presence of cardiac pacemaker: Secondary | ICD-10-CM | POA: Insufficient documentation

## 2014-03-18 DIAGNOSIS — M25512 Pain in left shoulder: Secondary | ICD-10-CM

## 2014-03-18 DIAGNOSIS — Z8673 Personal history of transient ischemic attack (TIA), and cerebral infarction without residual deficits: Secondary | ICD-10-CM | POA: Insufficient documentation

## 2014-03-18 DIAGNOSIS — S46909A Unspecified injury of unspecified muscle, fascia and tendon at shoulder and upper arm level, unspecified arm, initial encounter: Secondary | ICD-10-CM | POA: Insufficient documentation

## 2014-03-18 DIAGNOSIS — I5042 Chronic combined systolic (congestive) and diastolic (congestive) heart failure: Secondary | ICD-10-CM | POA: Insufficient documentation

## 2014-03-18 DIAGNOSIS — I10 Essential (primary) hypertension: Secondary | ICD-10-CM | POA: Insufficient documentation

## 2014-03-18 DIAGNOSIS — S6990XA Unspecified injury of unspecified wrist, hand and finger(s), initial encounter: Secondary | ICD-10-CM | POA: Insufficient documentation

## 2014-03-18 DIAGNOSIS — Y92009 Unspecified place in unspecified non-institutional (private) residence as the place of occurrence of the external cause: Secondary | ICD-10-CM | POA: Insufficient documentation

## 2014-03-18 DIAGNOSIS — E785 Hyperlipidemia, unspecified: Secondary | ICD-10-CM | POA: Insufficient documentation

## 2014-03-18 DIAGNOSIS — E1149 Type 2 diabetes mellitus with other diabetic neurological complication: Secondary | ICD-10-CM | POA: Insufficient documentation

## 2014-03-18 DIAGNOSIS — W19XXXA Unspecified fall, initial encounter: Secondary | ICD-10-CM

## 2014-03-18 DIAGNOSIS — Z791 Long term (current) use of non-steroidal anti-inflammatories (NSAID): Secondary | ICD-10-CM | POA: Insufficient documentation

## 2014-03-18 DIAGNOSIS — E1142 Type 2 diabetes mellitus with diabetic polyneuropathy: Secondary | ICD-10-CM | POA: Insufficient documentation

## 2014-03-18 DIAGNOSIS — G4733 Obstructive sleep apnea (adult) (pediatric): Secondary | ICD-10-CM | POA: Insufficient documentation

## 2014-03-18 DIAGNOSIS — Z8709 Personal history of other diseases of the respiratory system: Secondary | ICD-10-CM | POA: Insufficient documentation

## 2014-03-18 DIAGNOSIS — S4980XA Other specified injuries of shoulder and upper arm, unspecified arm, initial encounter: Secondary | ICD-10-CM | POA: Insufficient documentation

## 2014-03-18 DIAGNOSIS — Z9981 Dependence on supplemental oxygen: Secondary | ICD-10-CM | POA: Insufficient documentation

## 2014-03-18 DIAGNOSIS — G8929 Other chronic pain: Secondary | ICD-10-CM | POA: Insufficient documentation

## 2014-03-18 NOTE — ED Notes (Signed)
Pt. reports progressing left hand pain with swelling for the past several days , denies injury or fall .

## 2014-03-18 NOTE — ED Notes (Signed)
Patient is in Radiology.

## 2014-03-18 NOTE — ED Provider Notes (Signed)
CSN: [id number]     Arrival date & time 03/18/14  2246 History  This chart was scribed for non-physician practitioner, Felicie Morn, NP-C working with Brandt Loosen, MD by Luisa Dago, ED scribe. This patient was seen in room TR08C/TR08C and the patient's care was started at 11:50 PM.    Chief Complaint  Patient presents with  . Hand Pain   The history is provided by the patient. No language interpreter was used.   HPI Comments: [name] is a 48 y.o. female who presents to the Emergency Department complaining of worsening left hand pain that started about 2 weeks ago. Pt is still complaining of associated swelling, left shoulder pain, and weakness. She denies any recent injury or fall. Pt has a history of a CVA that occurred January 2015. Denies any fever, chills, diaphoresis, abdominal pain, nausea, or emesis.   PCP: Lars Masson, MD  Past Medical History  Diagnosis Date  . Hypertension     Poorly controlled. Has had HTN since age 2. Angioedema with ACEI.  24 Hr urine and renal arterial dopplers ordered . . . Never done  . Type II or unspecified type diabetes mellitus without mention of complication, uncontrolled DX: 2002  . NICM (nonischemic cardiomyopathy)     EF 45-50% in 8/12, cath 6/12 showed normal coronaries, EF 50-55% by LV gram  . Morbid obesity   . Allergic rhinitis   . HLD (hyperlipidemia)   . OSA on CPAP     sleep study in 8/12 showed moderate to severe OSA requiring CPAP  . Chronic lower back pain     secondary to DJD, obsetiy, hip problems. Followed by Dr. Ivory Broad (pain management)  . 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 Chronic diastolic heart failure      Primarily diastolic CHF: Likely due to uncontrolled HTN. Last echo (8/12) with EF 45-50%, mild to moderate LVH with some asymmetric septal hypertrophy, RV normal size and systolic function. EF 50-55% by LV-gram in 6/12.   . Degeneration of lumbar or lumbosacral intervertebral disc    . Polyneuropathy in diabetes(357.2)   . Thoracic or lumbosacral neuritis or radiculitis, unspecified   . Calcifying tendinitis of shoulder   . LBBB (left bundle branch block)   . Chronic combined systolic and diastolic CHF (congestive heart failure)     EF 40-45% by echo 12/06/2012  . Coronary artery disease     questionable. LHC 05/2011 showing normal coronaries // Followed at [facility], Dr. Shirlee Latch  . GERD (gastroesophageal reflux disease)    Past Surgical History  Procedure Laterality Date  . Breast biopsies Bilateral 2011    patient reports benign results  . Tubal ligation  05/31/1985  . Hand surgery Left   . Shoulder surgery Right   . Dental surgery      tumors removed   . Cardiac catheterization  05/2011  . Pacemaker/defibrillator placed  08/22/13 Left 08/22/13  . Tee without cardioversion N/A 01/14/2014    Procedure: TRANSESOPHAGEAL ECHOCARDIOGRAM (TEE);  Surgeon: Thurmon Fair, MD;  Location: [facility] ENDOSCOPY;  Service: Cardiovascular;  Laterality: N/A;   Family History  Problem Relation Age of Onset  . Heart disease Mother 60    Died of MI at age 21 yo  . Heart disease Paternal Grandmother     requiring pacemaker.  [name] Heart disease Paternal Grandfather 58    Died of MI at possibly age 48-53yo  . Stroke Paternal Grandfather   .  Heart disease Father 52    MI age 28yo requiring stenting  . Kidney disease Mother     requiring dialysis  . Congestive Heart Failure Mother   . Diabetes Father   . Diabetes Brother   . Heart disease Brother 43    MI at age 27 years old  . Breast cancer Paternal Aunt   . Breast cancer Maternal Grandmother    History  Substance Use Topics  . Smoking status: Never Smoker   . Smokeless tobacco: Never Used  . Alcohol Use: No   OB History   Grav Para Term Preterm Abortions TAB SAB Ect Mult Living                 Review of Systems  Constitutional: Negative for fever, chills and diaphoresis.  HENT: Negative for congestion.    Respiratory: Negative for cough.   Gastrointestinal: Negative for nausea and vomiting.  Musculoskeletal: Positive for arthralgias (left hand) and joint swelling (left hand).  All other systems reviewed and are negative.      Allergies  Clindamycin; Enalapril maleate; and Lisinopril  Home Medications   Current Outpatient Rx  Name  Route  Sig  Dispense  Refill  . amLODipine (NORVASC) 5 MG tablet   Oral   Take 5 mg by mouth daily.         [name] aspirin 325 MG EC tablet   Oral   Take 1 tablet (325 mg total) by mouth daily.   30 tablet   2   . carbamazepine (TEGRETOL) 200 MG tablet   Oral   Take 1 tablet (200 mg total) by mouth 2 (two) times daily.   60 tablet   5   . cloNIDine (CATAPRES) 0.2 MG tablet   Oral   Take 0.2 mg by mouth 2 (two) times daily.         . diclofenac sodium (VOLTAREN) 1 % GEL   Topical   Apply 2 g topically 4 (four) times daily as needed (for pain).   3 Tube   2   . digoxin (LANOXIN) 0.125 MG tablet   Oral   Take 1 tablet (0.125 mg total) by mouth daily.   30 tablet   11   . furosemide (LASIX) 40 MG tablet   Oral   Take 1 tablet (40 mg total) by mouth daily.   30 tablet   1   . insulin glargine (LANTUS) 100 UNIT/ML injection      Take 35 units at night and 10 units in the morning   30 mL   11   . metoprolol succinate (TOPROL-XL) 25 MG 24 hr tablet   Oral   Take 1 tablet (25 mg total) by mouth 2 (two) times daily.   60 tablet   1   . Multiple Vitamins-Iron (MULTIVITAMINS WITH IRON) TABS tablet   Oral   Take 1 tablet by mouth daily.      0   . oxyCODONE-acetaminophen (PERCOCET) 10-325 MG per tablet   Oral   Take 1 tablet by mouth every 6 (six) hours as needed for pain.   90 tablet   0   . pantoprazole (PROTONIX) 40 MG tablet   Oral   Take 1 tablet (40 mg total) by mouth daily.   30 tablet   5   . simvastatin (ZOCOR) 40 MG tablet   Oral   Take 1 tablet (40 mg total) by mouth daily at 6 PM.   30 tablet   1   .  tamsulosin (FLOMAX) 0.4 MG CAPS capsule   Oral   Take 1 capsule (0.4 mg total) by mouth daily after supper.   30 capsule   3   . tiZANidine (ZANAFLEX) 4 MG tablet   Oral   Take 1 tablet (4 mg total) by mouth 3 (three) times daily.   90 tablet   3    BP 180/116  Pulse 108  Temp(Src) 98.7 F (37.1 C) (Oral)  Resp 18  SpO2 98%  LMP 02/18/2014  Physical Exam  Nursing note and vitals reviewed. Constitutional: She appears well-developed and well-nourished. No distress.  HENT:  Head: Normocephalic and atraumatic.  Eyes: Conjunctivae are normal. Right eye exhibits no discharge. Left eye exhibits no discharge.  Neck: Neck supple.  Cardiovascular: Normal rate, regular rhythm and normal heart sounds.  Exam reveals no gallop and no friction rub.   No murmur heard. Pulmonary/Chest: Effort normal and breath sounds normal. No respiratory distress.  Abdominal: Soft. She exhibits no distension. There is no tenderness.  Musculoskeletal: She exhibits no edema and no tenderness.  Left sided weakness secondary to CVA. Tender to palpation over her right hand. No pointy tenderness noted. Distal pulses are present. Swelling to her left hand. Limited ROM of her left shoulder secondary to pain.   Neurological: She is alert.  Skin: Skin is warm and dry.  Psychiatric: She has a normal mood and affect. Her behavior is normal. Thought content normal.    ED Course  Procedures (including critical care time)  DIAGNOSTIC STUDIES: Oxygen Saturation is 98% on RA, normal by my interpretation.    COORDINATION OF CARE: 11:53 PM- Will order X-ray of left hand and shoulder. Pt advised of plan for treatment and pt agrees.  Labs Review Labs Reviewed - No data to display Imaging Review No results found.   EKG Interpretation None      MDM   Final diagnoses:  None    Left shoulder pain s/p fall. Left hand pain s/p fall.  Patient with left sided weakness due to recent stroke.  Fell from  wheelchair at home.  Radiology results reviewed and shared with patient.  No indication of bony abnormality.   I personally performed the services described in this documentation, which was scribed in my presence. The recorded information has been reviewed and is accurate.    Jimmye Norman, NP 03/19/14 [id number]

## 2014-03-19 ENCOUNTER — Emergency Department (HOSPITAL_COMMUNITY): Payer: Medicaid Other

## 2014-03-19 MED ORDER — OXYCODONE-ACETAMINOPHEN 10-325 MG PO TABS: 1.0000 | ORAL_TABLET | Freq: Four times a day (QID) | ORAL | Status: DC | PRN

## 2014-03-19 NOTE — ED Notes (Signed)
Pt st's she fell 2 weeks ago and had pain in left hand at that time but hand has continued to swell since fall.

## 2014-03-19 NOTE — Discharge Instructions (Signed)
Hand Contusion A hand contusion is a deep bruise on your hand area. Contusions are the result of an injury that caused bleeding under the skin. The contusion may turn blue, purple, or yellow. Minor injuries will give you a painless contusion, but more severe contusions may stay painful and swollen for a few weeks. CAUSES  A contusion is usually caused by a blow, trauma, or direct force to an area of the body. SYMPTOMS   Swelling and redness of the injured area.  Discoloration of the injured area.  Tenderness and soreness of the injured area.  Pain. DIAGNOSIS  The diagnosis can be made by taking a history and performing a physical exam. An X-ray, CT scan, or MRI may be needed to determine if there were any associated injuries, such as broken bones (fractures). TREATMENT  Often, the best treatment for a hand contusion is resting, elevating, icing, and applying cold compresses to the injured area. Over-the-counter medicines may also be recommended for pain control. HOME CARE INSTRUCTIONS   Put ice on the injured area.  Put ice in a plastic bag.  Place a towel between your skin and the bag.  Leave the ice on for 15-20 minutes, 03-04 times a day.  Only take over-the-counter or prescription medicines as directed by your caregiver. Your caregiver may recommend avoiding anti-inflammatory medicines (aspirin, ibuprofen, and naproxen) for 48 hours because these medicines may increase bruising.  If told, use an elastic wrap as directed. This can help reduce swelling. You may remove the wrap for sleeping, showering, and bathing. If your fingers become numb, cold, or blue, take the wrap off and reapply it more loosely.  Elevate your hand with pillows to reduce swelling.  Avoid overusing your hand if it is painful. SEEK IMMEDIATE MEDICAL CARE IF:   You have increased redness, swelling, or pain in your hand.  Your swelling or pain is not relieved with medicines.  You have loss of feeling in  your hand or are unable to move your fingers.  Your hand turns cold or blue.  You have pain when you move your fingers.  Your hand becomes warm to the touch.  Your contusion does not improve in 2 days. MAKE SURE YOU:   Understand these instructions.  Will watch your condition.  Will get help right away if you are not doing well or get worse. Document Released: 05/21/2002 Document Revised: 08/23/2012 Document Reviewed: 05/22/2012 [facility] Patient Information 2014 [place], [place]. Shoulder Pain The shoulder is the joint that connects your arms to your body. The bones that form the shoulder joint include the upper arm bone (humerus), the shoulder blade (scapula), and the collarbone (clavicle). The top of the humerus is shaped like a ball and fits into a rather flat socket on the scapula (glenoid cavity). A combination of muscles and strong, fibrous tissues that connect muscles to bones (tendons) support your shoulder joint and hold the ball in the socket. Small, fluid-filled sacs (bursae) are located in different areas of the joint. They act as cushions between the bones and the overlying soft tissues and help reduce friction between the gliding tendons and the bone as you move your arm. Your shoulder joint allows a wide range of motion in your arm. This range of motion allows you to do things like scratch your back or throw a ball. However, this range of motion also makes your shoulder more prone to pain from overuse and injury. Causes of shoulder pain can originate from both injury and overuse  and usually can be grouped in the following four categories:  Redness, swelling, and pain (inflammation) of the tendon (tendinitis) or the bursae (bursitis).  Instability, such as a dislocation of the joint.  Inflammation of the joint (arthritis).  Broken bone (fracture). HOME CARE INSTRUCTIONS   Apply ice to the sore area.  Put ice in a plastic bag.  Place a towel between your skin and the  bag.  Leave the ice on for 15-20 minutes, 03-04 times per day for the first 2 days.  Stop using cold packs if they do not help with the pain.  If you have a shoulder sling or immobilizer, wear it as long as your caregiver instructs. Only remove it to shower or bathe. Move your arm as little as possible, but keep your hand moving to prevent swelling.  Squeeze a soft ball or foam pad as much as possible to help prevent swelling.  Only take over-the-counter or prescription medicines for pain, discomfort, or fever as directed by your caregiver. SEEK MEDICAL CARE IF:   Your shoulder pain increases, or new pain develops in your arm, hand, or fingers.  Your hand or fingers become cold and numb.  Your pain is not relieved with medicines. SEEK IMMEDIATE MEDICAL CARE IF:   Your arm, hand, or fingers are numb or tingling.  Your arm, hand, or fingers are significantly swollen or turn white or blue. MAKE SURE YOU:   Understand these instructions.  Will watch your condition.  Will get help right away if you are not doing well or get worse. Document Released: 09/08/2005 Document Revised: 08/23/2012 Document Reviewed: 11/13/2011 [facility] Patient Information 2014 [place], [place].

## 2014-03-19 NOTE — ED Provider Notes (Signed)
Medical screening examination/treatment/procedure(s) were performed by non-physician practitioner and as supervising physician I was immediately available for consultation/collaboration.   EKG Interpretation None        Brandt Loosen, MD 03/19/14 [phone]

## 2014-03-22 MED ORDER — DIAPERS & SUPPLIES MISC: Status: DC

## 2014-03-25 ENCOUNTER — Encounter: Payer: Self-pay | Admitting: Internal Medicine

## 2014-03-25 ENCOUNTER — Ambulatory Visit (INDEPENDENT_AMBULATORY_CARE_PROVIDER_SITE_OTHER): Payer: Medicaid Other | Admitting: Internal Medicine

## 2014-03-25 ENCOUNTER — Ambulatory Visit: Payer: Medicaid Other | Admitting: Internal Medicine

## 2014-03-25 VITALS — BP 116/87 | HR 86 | Temp 96.0°F | Ht 69.0 in | Wt 265.8 lb

## 2014-03-25 DIAGNOSIS — E1149 Type 2 diabetes mellitus with other diabetic neurological complication: Secondary | ICD-10-CM

## 2014-03-25 DIAGNOSIS — IMO0002 Reserved for concepts with insufficient information to code with codable children: Secondary | ICD-10-CM

## 2014-03-25 DIAGNOSIS — G8194 Hemiplegia, unspecified affecting left nondominant side: Secondary | ICD-10-CM

## 2014-03-25 DIAGNOSIS — E1142 Type 2 diabetes mellitus with diabetic polyneuropathy: Secondary | ICD-10-CM

## 2014-03-25 DIAGNOSIS — M79609 Pain in unspecified limb: Secondary | ICD-10-CM

## 2014-03-25 DIAGNOSIS — E1140 Type 2 diabetes mellitus with diabetic neuropathy, unspecified: Secondary | ICD-10-CM

## 2014-03-25 DIAGNOSIS — L259 Unspecified contact dermatitis, unspecified cause: Secondary | ICD-10-CM | POA: Insufficient documentation

## 2014-03-25 DIAGNOSIS — M79622 Pain in left upper arm: Secondary | ICD-10-CM

## 2014-03-25 DIAGNOSIS — E1165 Type 2 diabetes mellitus with hyperglycemia: Principal | ICD-10-CM

## 2014-03-25 LAB — GLUCOSE, CAPILLARY: Glucose-Capillary: 309 mg/dL — ABNORMAL HIGH (ref 70–99)

## 2014-03-25 LAB — POCT GLYCOSYLATED HEMOGLOBIN (HGB A1C): HEMOGLOBIN A1C: 12.1

## 2014-03-25 MED ORDER — DIAPERS & SUPPLIES MISC: Status: DC

## 2014-03-25 MED ORDER — DIPHENHYDRAMINE-ZINC ACETATE 1-0.1 % EX CREA
TOPICAL_CREAM | Freq: Three times a day (TID) | CUTANEOUS | Status: DC | PRN
Start: 2014-03-25 — End: 2014-07-04

## 2014-03-25 MED ORDER — INSULIN GLARGINE 100 UNIT/ML ~~LOC~~ SOLN
10.0000 [IU] | Freq: Two times a day (BID) | SUBCUTANEOUS | Status: DC
Start: 2014-03-25 — End: 2014-05-08

## 2014-03-25 NOTE — Assessment & Plan Note (Addendum)
Uncontrolled. A1C today is 12.1.  Plans: Increase her Lantus to 13 units in AM and 30 units at night.  Asked patient to bring her glucometer to her next office visit. Patient is to see her PCP on 04/12/2014 and at the time we will consider switching her to Lantus + SSI instead of Lantus monotherapy. Discussed with her PCP in the clinic. Not really sure the reason for stopping Metformin.  Advised patient that she needs a close follow up while we titrate her insulin regimen.

## 2014-03-25 NOTE — Assessment & Plan Note (Signed)
Suspect secondary to the bracing given the onset of symptoms and the location and extent of symptoms.  Plans: Topical Benadryl twice daily. Recommended to use clothing as barrier between skin and the braces. Recommended skin moisturizers topically to help with dry skin. Patient is to see Physical med and rehab physician in a week and is to discuss the alternatives to the bracing.

## 2014-03-25 NOTE — Patient Instructions (Addendum)
Take Lantus 13 units in the morning and 30 units at bedtime. Check your blood sugars 4 times daily as instructed and bring your glucometer to each office visit. Use Benadryl cream over your left forearm 2-3 times daily to help with itching. Use protective clothing on your left forearm before you use Bracing to prevent direct contact with your skin. Wipe your left arm pit area with a dry cloth few times a day to remove the moisture. Clean your left arm pit area with soap twice a day and keep it clean and dry.  If your symptoms do not improve or worsen, or if you are having fever or chills, please give [place] a call or seek medical help. Use Percocet half the tablet for pain and see if that helps. Do not take percocet if you do not have pain. Use all your other medications as advised. Follow up with neurology on 04/29/14 at 2:30PM as scheduled. Follow up with your PCP on 04/12/14 at 1:45PM as scheduled.

## 2014-03-25 NOTE — Progress Notes (Signed)
Subjective:   Patient ID: ALEJAH ARISTIZABAL female   DOB: [birth year] 48 y.o.   MRN: [id number]  HPI: Ms.Ubah E Coin is a 48 y.o. with PMH significant for HTN, Uncontrolled DM, HLD, Chronic combined CHF with ICD placement comes to the office with CC of rash over the left forearm and left arm pit.   Patient has left hemiparesis and is sitting in a wheel chair. The history is obtained from the patient.  Patient reports that the rash over the left forearm started about 2 months ago while she was in the hospital. Patient reports that she was prescribed left bracing to be applied to her left forearm at night and since then her rash developed and persisted. She reports the rash being very itchy, gradually getting worse. She denies using any lotions or creams and reports using the brace every night.  Patient reports that the rash under her left arm pit started recently couple of weeks ago and is slightly painful. She denies any discharge from the area and denies any fever, chills, body pains. She denies using any OTC medications for the rash.   She reports checking her blood sugars at least 3 times daily but didn't bring her glucometer.   She denies any other complaints.    Past Medical History  Diagnosis Date  . Hypertension     Poorly controlled. Has had HTN since age 26. Angioedema with ACEI.  24 Hr urine and renal arterial dopplers ordered . . . Never done  . Type II or unspecified type diabetes mellitus without mention of complication, uncontrolled DX: 2002  . NICM (nonischemic cardiomyopathy)     EF 45-50% in 8/12, cath 6/12 showed normal coronaries, EF 50-55% by LV gram  . Morbid obesity   . Allergic rhinitis   . HLD (hyperlipidemia)   . OSA on CPAP     sleep study in 8/12 showed moderate to severe OSA requiring CPAP  . Chronic lower back pain     secondary to DJD, obsetiy, hip problems. Followed by Dr. Ivory Broad (pain management)  . 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 Chronic diastolic heart failure      Primarily diastolic CHF: Likely due to uncontrolled HTN. Last echo (8/12) with EF 45-50%, mild to moderate LVH with some asymmetric septal hypertrophy, RV normal size and systolic function. EF 50-55% by LV-gram in 6/12.   . Degeneration of lumbar or lumbosacral intervertebral disc   . Polyneuropathy in diabetes(357.2)   . Thoracic or lumbosacral neuritis or radiculitis, unspecified   . Calcifying tendinitis of shoulder   . LBBB (left bundle branch block)   . Chronic combined systolic and diastolic CHF (congestive heart failure)     EF 40-45% by echo 12/06/2012  . Coronary artery disease     questionable. LHC 05/2011 showing normal coronaries // Followed at [facility], Dr. Shirlee Latch  . GERD (gastroesophageal reflux disease)    Current Outpatient Prescriptions  Medication Sig Dispense Refill  . amLODipine (NORVASC) 5 MG tablet Take 5 mg by mouth daily.      . carbamazepine (TEGRETOL) 200 MG tablet Take 200 mg by mouth 2 (two) times daily.      . cloNIDine (CATAPRES) 0.2 MG tablet Take 0.2 mg by mouth 2 (two) times daily.      . Diapers & Supplies MISC Use as needed.  Size Extra Large  1 each  0  . diclofenac sodium (VOLTAREN) 1 % GEL  Apply 2 g topically 4 (four) times daily as needed (pain).      [name] digoxin (LANOXIN) 0.125 MG tablet Take 0.125 mg by mouth daily.      . furosemide (LASIX) 40 MG tablet Take 40 mg by mouth.      [name] ibuprofen (ADVIL,MOTRIN) 200 MG tablet Take 400 mg by mouth 3 (three) times daily as needed for mild pain.      [name] insulin glargine (LANTUS) 100 UNIT/ML injection Inject 10-30 Units into the skin 2 (two) times daily. Take 10 units in the morning, and 30 units at bedtime.      . metoprolol succinate (TOPROL-XL) 25 MG 24 hr tablet Take 25 mg by mouth 2 (two) times daily.      . Multiple Vitamins-Iron (DAILY MULTIVITAMINS/IRON PO) Take 1 tablet by mouth daily.      [name] oxyCODONE-acetaminophen (PERCOCET) 10-325 MG per  tablet Take 1 tablet by mouth every 6 (six) hours as needed for pain.  90 tablet  0  . oxyCODONE-acetaminophen (PERCOCET) 10-325 MG per tablet Take 1 tablet by mouth every 6 (six) hours as needed for pain.  15 tablet  0  . pantoprazole (PROTONIX) 40 MG tablet Take 40 mg by mouth daily.      . simvastatin (ZOCOR) 40 MG tablet Take 40 mg by mouth daily.      . tamsulosin (FLOMAX) 0.4 MG CAPS capsule Take 0.4 mg by mouth daily after supper.      [name] tiZANidine (ZANAFLEX) 4 MG tablet Take 4 mg by mouth 3 (three) times daily.       No current facility-administered medications for this visit.   Family History  Problem Relation Age of Onset  . Heart disease Mother 30    Died of MI at age 59 yo  . Heart disease Paternal Grandmother     requiring pacemaker.  [name] Heart disease Paternal Grandfather 70    Died of MI at possibly age 79-53yo  . Stroke Paternal Grandfather   . Heart disease Father 68    MI age 39yo requiring stenting  . Kidney disease Mother     requiring dialysis  . Congestive Heart Failure Mother   . Diabetes Father   . Diabetes Brother   . Heart disease Brother 44    MI at age 104 years old  . Breast cancer Paternal Aunt   . Breast cancer Maternal Grandmother    History   Social History  . Marital Status: Single    Spouse Name: N/A    Number of Children: 2  . Years of Education: 9th grade   Occupational History  . Unemployed     planning on getting disability   Social History Main Topics  . Smoking status: Never Smoker   . Smokeless tobacco: Never Used  . Alcohol Use: No  . Drug Use: No  . Sexual Activity: Yes    Birth Control/ Protection: Surgical   Other Topics Concern  . None   Social History Narrative   Lives in [place] with her son. Is able to read and write fluently in [place].   Review of Systems: Pertinent items are noted in HPI. Objective:  Physical Exam: Filed Vitals:   03/25/14 0920  BP: 116/87  Pulse: 86  Temp: 96 F (35.6 C)  TempSrc:  Oral  Height: 5\' 9"  (1.753 m)  Weight: 265 lb 12.8 oz (120.566 kg)  SpO2: 98%   Constitutional: Vital signs reviewed.   Patient is a well-developed and well-nourished,  obese, sitting in wheel chair and does not appear to be in any acute distress and cooperative with exam. Alert and oriented x3.  Head: Normocephalic and atraumatic Cardiovascular: RRR, S1 normal, S2 normal, no MRG, pulses symmetric and intact bilaterally Pulmonary/Chest: normal respiratory effort, CTAB, no wheezes, rales, or rhonchi Skin: Maculopapular rash noted over the ventral aspect of the left forearm extending from the wrist to the elbow. Signs of excoriations noted over the left forearm.  Left arm pit: Mild erythema noted along with anterior axillary fold of the left arm pit. 1-2 blisters noted along the skin fold. No signs of excoriation or skin break noted. Moderate amount of moisture under the areas of skin folds. No signs of purulent discharge.  Neurological: A&O x3, Strength is normal in the right and left upper extremity. 0-1/5 in LUE, LLE. Sensations to light touch are symmetric bilaterally. Visible left facial drooping noted.  Skin: Warm, dry and intact. No rash, cyanosis, or clubbing.  Psychiatric: Normal mood and affect. speech and behavior is normal. Judgment and thought content normal. Cognition and memory are normal.    Assessment & Plan:

## 2014-03-25 NOTE — Assessment & Plan Note (Addendum)
In the setting of left sided hemiparesis with 0-1/5 LUE strength. Clinical exam consistent with axillary intertrigo. Her risk factors such as obesity, Uncontrolled DM, Inability to move her LUE put her at high risk for infection in the area. Currently no signs of bacterial or fungal infections.  Plans: Topical skin care with drying agents to reduce moisture and maceration. Recommended to apply dry clothing in the area to absorb moisture few times a day. Maintenance of good hygiene and aeration to the area. Avoid tight clothing Avoid topical steroids. Recommended to follow up if symptoms persist.  No need for antifungals or anti-bacterials currently.

## 2014-03-28 ENCOUNTER — Other Ambulatory Visit: Payer: Self-pay | Admitting: *Deleted

## 2014-03-28 ENCOUNTER — Telehealth: Payer: Self-pay | Admitting: Registered Nurse

## 2014-03-28 ENCOUNTER — Ambulatory Visit: Payer: Medicaid Other | Admitting: Physical Medicine and Rehabilitation

## 2014-03-28 ENCOUNTER — Encounter: Payer: Self-pay | Admitting: Registered Nurse

## 2014-03-28 ENCOUNTER — Encounter: Payer: Medicaid Other | Attending: Physical Medicine & Rehabilitation | Admitting: Registered Nurse

## 2014-03-28 VITALS — BP 157/88 | HR 118 | Resp 16 | Ht 69.0 in | Wt 262.4 lb

## 2014-03-28 DIAGNOSIS — E1142 Type 2 diabetes mellitus with diabetic polyneuropathy: Secondary | ICD-10-CM

## 2014-03-28 DIAGNOSIS — I639 Cerebral infarction, unspecified: Secondary | ICD-10-CM

## 2014-03-28 DIAGNOSIS — E1165 Type 2 diabetes mellitus with hyperglycemia: Secondary | ICD-10-CM

## 2014-03-28 DIAGNOSIS — G8114 Spastic hemiplegia affecting left nondominant side: Secondary | ICD-10-CM

## 2014-03-28 DIAGNOSIS — G8110 Spastic hemiplegia affecting unspecified side: Secondary | ICD-10-CM | POA: Insufficient documentation

## 2014-03-28 DIAGNOSIS — E1140 Type 2 diabetes mellitus with diabetic neuropathy, unspecified: Secondary | ICD-10-CM

## 2014-03-28 DIAGNOSIS — E1149 Type 2 diabetes mellitus with other diabetic neurological complication: Secondary | ICD-10-CM

## 2014-03-28 DIAGNOSIS — I6350 Cerebral infarction due to unspecified occlusion or stenosis of unspecified cerebral artery: Secondary | ICD-10-CM | POA: Insufficient documentation

## 2014-03-28 DIAGNOSIS — M1710 Unilateral primary osteoarthritis, unspecified knee: Secondary | ICD-10-CM | POA: Insufficient documentation

## 2014-03-28 DIAGNOSIS — IMO0002 Reserved for concepts with insufficient information to code with codable children: Secondary | ICD-10-CM

## 2014-03-28 MED ORDER — OXYCODONE-ACETAMINOPHEN 10-325 MG PO TABS: 1.0000 | ORAL_TABLET | Freq: Three times a day (TID) | ORAL | Status: DC | PRN

## 2014-03-28 MED ORDER — OXYCODONE-ACETAMINOPHEN 10-325 MG PO TABS: 1.0000 | ORAL_TABLET | Freq: Four times a day (QID) | ORAL | Status: DC | PRN

## 2014-03-28 MED ORDER — OXYCODONE-ACETAMINOPHEN 10-325 MG PO TABS
1.0000 | ORAL_TABLET | Freq: Three times a day (TID) | ORAL | Status: DC | PRN
Start: 2014-03-28 — End: 2014-04-26

## 2014-03-28 MED ORDER — TIZANIDINE HCL 4 MG PO TABS: 4.0000 mg | ORAL_TABLET | Freq: Three times a day (TID) | ORAL | Status: DC

## 2014-03-28 NOTE — Telephone Encounter (Signed)
Dr. Riley Kill, [name] was wondering if you will write a script for a motorized wheelchair.

## 2014-03-28 NOTE — Progress Notes (Signed)
Subjective:    Patient ID: [name], female    DOB: 04-Jan-1966, 48 y.o.   MRN: [id number]  HPI: [name] is a 48 year old female with a past medical history significant for HTN, DM, hyperlipidemia, morbid obesity, nonischemic cardiomyopathy, chronic diastolic heart failure, CAD, s/p pacemaker placement, OSA on CPAP, DJD, and GERD. She was admitted to [facility] on 01/09/14 cardioembolic right MCA infarct, left hemiparesis and LUE sensory deficient. She returns for follow up for chronic pain and medication refill. She has fallen twice since the last visit. Two weeks ago her wheelchair cushion shifted while her daughter was pulling her up and she slid out of wheelchair and landed on her buttocks.She sought medical attention. She states no abnormal findings. A week ago she was trying to move too fast while pushing her wheelchair, she lost her balanced and landed on her knees. She did not seek medical attention. Her daughter helped her up. She was instructed to take her time and she verbalized understanding. She came to the office with a walker. She says she uses a walker, cane and wheelchair at home depending on how she feels. Also noticed when its raining and cold she arises stiff and takes time for her to get mobile.Her pain is in her back and right knee. She rates her pain 7. Her current exercise regime is performing her stretching exercises. She performs the exercises well.She denies being depressed, but admits it can be frustrating at times. Just wants to regain as much function as possible with her daily activities. Has good family support her brother in the room with her. She finished home physical therapy with [facility] two weeks ago. She doesn't feel she received her 20 visits, Kriste Basque the social worker from [facility] is helping her with this matter. A referral has been made to outpatient therapy.  Pain Inventory Average Pain 7 Pain Right Now 7 My pain is intermittent and  tingling  In the last 24 hours, has pain interfered with the following? General activity 8 Relation with others 8 Enjoyment of life 9 What TIME of day is your pain at its worst? morning Sleep (in general) Fair  Pain is worse with: walking Pain improves with: medication Relief from Meds: 5  Mobility walk without assistance walk with assistance use a cane use a walker ability to climb steps?  no do you drive?  no  Function disabled: date disabled 2015 I need assistance with the following:  feeding, dressing, bathing, toileting, meal prep, household duties and shopping  Neuro/Psych bladder control problems weakness tingling trouble walking spasms dizziness confusion anxiety loss of taste or smell  Prior Studies Any changes since last visit?  no  Physicians involved in your care Any changes since last visit?  no   Family History  Problem Relation Age of Onset  . Heart disease Mother 24    Died of MI at age 86 yo  . Heart disease Paternal Grandmother     requiring pacemaker.  [name] Heart disease Paternal Grandfather 64    Died of MI at possibly age 47-53yo  . Stroke Paternal Grandfather   . Heart disease Father 25    MI age 28yo requiring stenting  . Kidney disease Mother     requiring dialysis  . Congestive Heart Failure Mother   . Diabetes Father   . Diabetes Brother   . Heart disease Brother 65    MI at age 48 years old  . Breast  cancer Paternal Aunt   . Breast cancer Maternal Grandmother    History   Social History  . Marital Status: Single    Spouse Name: N/A    Number of Children: 2  . Years of Education: 9th grade   Occupational History  . Unemployed     planning on getting disability   Social History Main Topics  . Smoking status: Never Smoker   . Smokeless tobacco: Never Used  . Alcohol Use: No  . Drug Use: No  . Sexual Activity: Yes    Birth Control/ Protection: Surgical   Other Topics Concern  . None   Social History Narrative     Lives in [place] with her son. Is able to read and write fluently in [place].   Past Surgical History  Procedure Laterality Date  . Breast biopsies Bilateral 2011    patient reports benign results  . Tubal ligation  05/31/1985  . Hand surgery Left   . Shoulder surgery Right   . Dental surgery      tumors removed   . Cardiac catheterization  05/2011  . Pacemaker/defibrillator placed  08/22/13 Left 08/22/13  . Tee without cardioversion N/A 01/14/2014    Procedure: TRANSESOPHAGEAL ECHOCARDIOGRAM (TEE);  Surgeon: Thurmon Fair, MD;  Location: [facility] ENDOSCOPY;  Service: Cardiovascular;  Laterality: N/A;   Past Medical History  Diagnosis Date  . Hypertension     Poorly controlled. Has had HTN since age 34. Angioedema with ACEI.  24 Hr urine and renal arterial dopplers ordered . . . Never done  . Type II or unspecified type diabetes mellitus without mention of complication, uncontrolled DX: 2002  . NICM (nonischemic cardiomyopathy)     EF 45-50% in 8/12, cath 6/12 showed normal coronaries, EF 50-55% by LV gram  . Morbid obesity   . Allergic rhinitis   . HLD (hyperlipidemia)   . OSA on CPAP     sleep study in 8/12 showed moderate to severe OSA requiring CPAP  . Chronic lower back pain     secondary to DJD, obsetiy, hip problems. Followed by Dr. Ivory Broad (pain management)  . 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 Chronic diastolic heart failure      Primarily diastolic CHF: Likely due to uncontrolled HTN. Last echo (8/12) with EF 45-50%, mild to moderate LVH with some asymmetric septal hypertrophy, RV normal size and systolic function. EF 50-55% by LV-gram in 6/12.   . Degeneration of lumbar or lumbosacral intervertebral disc   . Polyneuropathy in diabetes(357.2)   . Thoracic or lumbosacral neuritis or radiculitis, unspecified   . Calcifying tendinitis of shoulder   . LBBB (left bundle branch block)   . Chronic combined systolic and diastolic CHF (congestive heart  failure)     EF 40-45% by echo 12/06/2012  . Coronary artery disease     questionable. LHC 05/2011 showing normal coronaries // Followed at [facility], Dr. Shirlee Latch  . GERD (gastroesophageal reflux disease)    BP 157/88  Pulse 118  Resp 16  Ht 5\' 9"  (1.753 m)  Wt 262 lb 6.4 oz (119.024 kg)  BMI 38.73 kg/m2  SpO2 99%  LMP 02/18/2014  Opioid Risk Score:   Fall Risk Score: High Fall Risk (>13 points) (educated and hand out given on fall prevention in the home)  Review of Systems  Constitutional: Positive for diaphoresis, appetite change and unexpected weight change.  Cardiovascular: Positive for leg swelling.  Genitourinary:  Bladder control problems  Musculoskeletal: Positive for gait problem.       Spasms  Neurological: Positive for dizziness and weakness.       Tingling  Psychiatric/Behavioral: The patient is nervous/anxious.   All other systems reviewed and are negative.      Objective:   Physical Exam  Nursing note and vitals reviewed. Constitutional: She is oriented to person, place, and time. She appears well-developed and well-nourished.  Obese  HENT:  Head: Normocephalic.  Left sided facial droop  Neck: Normal range of motion. Neck supple.  Cardiovascular: Normal rate, regular rhythm and normal heart sounds.   Pulmonary/Chest: Effort normal and breath sounds normal.  Musculoskeletal:  Normal Muscle Bulk: Muscle testing Reveals: Right arm grip 5/5 . Full ROM.  Left arm grip: 1/5. Left arm: Paretic with spasticity noted with movement. She's carrying left arm in a protective position. Right Leg: Full ROM Left Hand: Fingers are tender to touch with trace edema noted. Left leg: Paralysis noted. Flexion: Tightness and pain noted. Hemiplegic gait. Using a walker  Neurological: She is oriented to person, place, and time.  Speech clear and concise  Skin: Skin is warm and dry.  Psychiatric: She has a normal mood and affect.          Assessment &  Plan:   1. Chronic low back pain: Continue Current Medication Regime. Refilled: oxyCODONE 10/325mg  one tablet every 8 hours as needed.#90 2. Diabetic peripheral neuropathy: Continue Percocet. 3. Recent Right MCA infarct:  Continue stretching exercises, On Zanaflex.Out patient physical therapy referral ordered. 4. OA: Continue Voltaren gel  45 minutes of face to face patient care time was spent during this visit. All questions were encouraged and answered.

## 2014-03-28 NOTE — Telephone Encounter (Signed)
She will need to go to outpt PT for a wheelchair evaluation

## 2014-03-29 NOTE — Telephone Encounter (Signed)
Order for physical therapy placed for patient to be evaulated for wheelchair.

## 2014-04-04 NOTE — Progress Notes (Signed)
Case discussed with Dr. Boggala at the time of the visit.  We reviewed the resident's history and exam and pertinent patient test results.  I agree with the assessment, diagnosis, and plan of care documented in the resident's note. 

## 2014-04-08 ENCOUNTER — Telehealth: Payer: Self-pay | Admitting: *Deleted

## 2014-04-08 NOTE — Telephone Encounter (Signed)
Verbal for pharmacy to change vial of lantus to solostar pen was given, is this ok with you?

## 2014-04-12 ENCOUNTER — Ambulatory Visit (INDEPENDENT_AMBULATORY_CARE_PROVIDER_SITE_OTHER): Payer: Medicaid Other | Admitting: Internal Medicine

## 2014-04-12 ENCOUNTER — Encounter: Payer: Self-pay | Admitting: Internal Medicine

## 2014-04-12 VITALS — BP 138/87 | HR 78 | Temp 97.8°F | Ht 69.0 in | Wt 260.7 lb

## 2014-04-12 DIAGNOSIS — IMO0002 Reserved for concepts with insufficient information to code with codable children: Secondary | ICD-10-CM

## 2014-04-12 DIAGNOSIS — E1149 Type 2 diabetes mellitus with other diabetic neurological complication: Secondary | ICD-10-CM

## 2014-04-12 DIAGNOSIS — E1140 Type 2 diabetes mellitus with diabetic neuropathy, unspecified: Secondary | ICD-10-CM

## 2014-04-12 DIAGNOSIS — I639 Cerebral infarction, unspecified: Secondary | ICD-10-CM

## 2014-04-12 DIAGNOSIS — E1165 Type 2 diabetes mellitus with hyperglycemia: Secondary | ICD-10-CM

## 2014-04-12 DIAGNOSIS — E1142 Type 2 diabetes mellitus with diabetic polyneuropathy: Secondary | ICD-10-CM

## 2014-04-12 DIAGNOSIS — Z8673 Personal history of transient ischemic attack (TIA), and cerebral infarction without residual deficits: Secondary | ICD-10-CM

## 2014-04-12 LAB — GLUCOSE, CAPILLARY: GLUCOSE-CAPILLARY: 196 mg/dL — AB (ref 70–99)

## 2014-04-12 MED ORDER — METFORMIN HCL 1000 MG PO TABS: 1000.0000 mg | ORAL_TABLET | Freq: Every day | ORAL | Status: DC

## 2014-04-12 NOTE — Patient Instructions (Signed)
1. Please schedule a follow up appointment for 2-4 weeks.  2. Please take all medications as prescribed. Start taking Metformin 1000 mg daily.    PLEASE SEE PT AND SPORTS MEDICINE FOR SLING.  3. If you have worsening of your symptoms or new symptoms arise, please call the clinic ([phone]), or go to the ER immediately if symptoms are severe.  You have done a great job in taking all your medications. I appreciate it very much. Please continue doing that.

## 2014-04-12 NOTE — Progress Notes (Signed)
Patient ID: [name], female   DOB: 1966-05-26, 48 y.o.   MRN: [id number]   Subjective:   Patient ID: [name] female   DOB: [birth year] 48 y.o.   MRN: [id number]  HPI: [name] is a 48 y.o. female w/ PMHx of HTN, HLD, uncontrolled DM type II, OSA, CAD, CHF, and h/o CVA w/ left-sided residual weakness, presents to the clinic today for a follow-up visit. Patient was recently seen in the clinic for her uncontrolled DM, where her Lantus dosage was increased to 10 units in the AM and 35 units in the PM. Most recent HbA1c was 12.1 on 03/25/14. Today she presents w/ CBG of 196, significantly improved from previous CBG's in the 300's. The patient claims she has been compliant w/ her new Insulin regimen.  [name] main health issue involves her recent CVA for which she has left-sided residual weakness involving her left arm and leg, however, she says she is doing much better, especially w/ ambulation. The patient was inquiring today about an electric powered wheelchair, however, she is ambulating well today.  One of her main complaints today is left hand swelling. She had an XR of the hand on 03/18/14 which did not show an acute fracture. Given that she is generally unable to lift the arm, it seems that the swelling is likely gravity dependent edema at this time. No erythema or increased warmth noted in the hand. The patient also complains of mild discomfort in her axilla as well, noted to have hyperpigmentation in this region.  No further complaints.   Past Medical History  Diagnosis Date  . Hypertension     Poorly controlled. Has had HTN since age 50. Angioedema with ACEI.  24 Hr urine and renal arterial dopplers ordered . . . Never done  . Type II or unspecified type diabetes mellitus without mention of complication, uncontrolled DX: 2002  . NICM (nonischemic cardiomyopathy)     EF 45-50% in 8/12, cath 6/12 showed normal coronaries, EF 50-55% by LV gram  . Morbid obesity   .  Allergic rhinitis   . HLD (hyperlipidemia)   . OSA on CPAP     sleep study in 8/12 showed moderate to severe OSA requiring CPAP  . Chronic lower back pain     secondary to DJD, obsetiy, hip problems. Followed by Dr. Ivory Broad (pain management)  . 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 Chronic diastolic heart failure      Primarily diastolic CHF: Likely due to uncontrolled HTN. Last echo (8/12) with EF 45-50%, mild to moderate LVH with some asymmetric septal hypertrophy, RV normal size and systolic function. EF 50-55% by LV-gram in 6/12.   . Degeneration of lumbar or lumbosacral intervertebral disc   . Polyneuropathy in diabetes(357.2)   . Thoracic or lumbosacral neuritis or radiculitis, unspecified   . Calcifying tendinitis of shoulder   . LBBB (left bundle branch block)   . Chronic combined systolic and diastolic CHF (congestive heart failure)     EF 40-45% by echo 12/06/2012  . Coronary artery disease     questionable. LHC 05/2011 showing normal coronaries // Followed at [facility], Dr. Shirlee Latch  . GERD (gastroesophageal reflux disease)    Current Outpatient Prescriptions  Medication Sig Dispense Refill  . amLODipine (NORVASC) 5 MG tablet Take 5 mg by mouth daily.      [name] aspirin 325 MG tablet Take 325 mg by mouth daily.      [name]  carbamazepine (TEGRETOL) 200 MG tablet Take 200 mg by mouth 2 (two) times daily.      . cloNIDine (CATAPRES) 0.2 MG tablet Take 0.2 mg by mouth 2 (two) times daily.      . Diapers & Supplies MISC 1  2 each  6  . diclofenac sodium (VOLTAREN) 1 % GEL Apply 2 g topically 4 (four) times daily as needed (pain).      [name] digoxin (LANOXIN) 0.125 MG tablet Take 0.125 mg by mouth daily.      . diphenhydrAMINE-zinc acetate (BENADRYL) cream Apply topically 3 (three) times daily as needed for itching.  28.3 g  0  . furosemide (LASIX) 40 MG tablet Take 40 mg by mouth.      [name] ibuprofen (ADVIL,MOTRIN) 200 MG tablet Take 400 mg by mouth 3 (three)  times daily as needed for mild pain.      [name] insulin glargine (LANTUS) 100 UNIT/ML injection Inject 0.1-0.3 mLs (10-30 Units total) into the skin 2 (two) times daily. Take 13 units in the morning, and 30 units at bedtime.  10 mL  1  . metFORMIN (GLUCOPHAGE) 1000 MG tablet Take 1 tablet (1,000 mg total) by mouth daily with breakfast.  30 tablet  11  . metoprolol succinate (TOPROL-XL) 25 MG 24 hr tablet Take 25 mg by mouth 2 (two) times daily.      . Multiple Vitamins-Iron (DAILY MULTIVITAMINS/IRON PO) Take 1 tablet by mouth daily.      [name] oxyCODONE-acetaminophen (PERCOCET) 10-325 MG per tablet Take 1 tablet by mouth every 8 (eight) hours as needed for pain.  90 tablet  0  . pantoprazole (PROTONIX) 40 MG tablet Take 40 mg by mouth daily.      . simvastatin (ZOCOR) 40 MG tablet Take 40 mg by mouth daily.      . tamsulosin (FLOMAX) 0.4 MG CAPS capsule Take 0.4 mg by mouth daily after supper.      [name] tiZANidine (ZANAFLEX) 4 MG tablet Take 1 tablet (4 mg total) by mouth 3 (three) times daily.  90 tablet  2   No current facility-administered medications for this visit.   Family History  Problem Relation Age of Onset  . Heart disease Mother 39    Died of MI at age 48 yo  . Heart disease Paternal Grandmother     requiring pacemaker.  [name] Heart disease Paternal Grandfather 49    Died of MI at possibly age 61-53yo  . Stroke Paternal Grandfather   . Heart disease Father 78    MI age 15yo requiring stenting  . Kidney disease Mother     requiring dialysis  . Congestive Heart Failure Mother   . Diabetes Father   . Diabetes Brother   . Heart disease Brother 2    MI at age 71 years old  . Breast cancer Paternal Aunt   . Breast cancer Maternal Grandmother    History   Social History  . Marital Status: Single    Spouse Name: N/A    Number of Children: 2  . Years of Education: 9th grade   Occupational History  . Unemployed     planning on getting disability   Social History Main Topics  .  Smoking status: Never Smoker   . Smokeless tobacco: Never Used  . Alcohol Use: No  . Drug Use: No  . Sexual Activity: Yes    Birth Control/ Protection: Surgical   Other Topics Concern  . None   Social History Narrative  Lives in [place] with her son. Is able to read and write fluently in [place].    Review of Systems: General: Denies fever, chills, diaphoresis, appetite change and fatigue.  Respiratory: Denies SOB, DOE, cough, chest tightness, and wheezing.   Cardiovascular: Denies chest pain and palpitations.  Gastrointestinal: Denies nausea, vomiting, abdominal pain, diarrhea, constipation, blood in stool and abdominal distention.  Genitourinary: Denies dysuria, urgency, frequency, hematuria, and flank pain. Endocrine: Denies hot or cold intolerance, polyuria, and polydipsia. Musculoskeletal: Positive for gait problem and left hand swelling. Denies myalgias, back pain, joint swelling, arthralgias..  Skin: Positive for left axillary hyperpigmentation. Denies pallor, rash and wounds.  Neurological: Positive for left-sided residual weakness. Denies dizziness, seizures, syncope, weakness, lightheadedness, numbness and headaches.  Psychiatric/Behavioral: Denies mood changes, confusion, nervousness, sleep disturbance and agitation.  Objective:   Physical Exam: Filed Vitals:   04/12/14 1428  BP: 138/87  Pulse: 78  Temp: 97.8 F (36.6 C)  TempSrc: Oral  Height: 5\' 9"  (1.753 m)  Weight: 260 lb 11.2 oz (118.253 kg)  SpO2: 100%   General: Vital signs reviewed.  Patient is an obese female, in no acute distress and cooperative with exam.  Head: Normocephalic and atraumatic. Left-sided lower facial droop.  Eyes: PERRL, EOMI, conjunctivae normal, No scleral icterus.  Neck: Supple, trachea midline, normal ROM, No JVD, masses, thyromegaly, or carotid bruit present.  Cardiovascular: RRR, S1 normal, S2 normal, no murmurs, gallops, or rubs. Pulmonary/Chest: Air entry equal bilaterally,  no wheezes, rales, or rhonchi. Abdominal: Soft, non-tender, non-distended, BS +, no masses, organomegaly, or guarding present.  Musculoskeletal: Left arm weakness and hand swelling. No pain w/ passive ROM. No joint deformities, erythema, or stiffness. Extremities: Left hand pitting edema. Also w/ +1 LE edema. Pulses symmetric and intact bilaterally. No cyanosis or clubbing. Neurological: A&O x3. Left-sided facial droop and weakness. Otherwise, CN's intact. 1/5 strength in LUE, 4/5 in LLE. Otherwise strength intact. Sensory abnormalities noted over the LUE.  Skin: Warm, dry and intact. No rashes or erythema. Hyperpigmentation in the left axilla.  Psychiatric: Normal mood and affect. speech and behavior is normal. Cognition and memory are normal.   Assessment & Plan:   Please see problem based assessment and plan.

## 2014-04-13 ENCOUNTER — Encounter: Payer: Self-pay | Admitting: Internal Medicine

## 2014-04-13 NOTE — Assessment & Plan Note (Signed)
Patient appears that she is doing significantly better s/p CVA. Claims she is ambulating better recently able to walk into the clinic today. Patient did inquire about getting an electric wheelchair which I DO NOT recommend at this time as her ambulation abilities are improving. This was discussed at length with the patient during our visit, and she agreed that it would be better to continue to improve her strength and endurance.  -It would however be reasonable to get the patient an manual wheelchair for longer distances.  -Sent referral for outpatient PT to improve endurance, strength, and ambulation.  -Will follow up w/ Sports Medicine in near future for further management of upper extremity. Edema of the left hand likely 2/2 gravity at this time as she is unable to lift the hand. Would benefit from a sling, says she does not have one that fits her, she will discuss this with Sports Medicine at next visit. No concern for acute fracture, cellulitis, or wrist joint effusion at this time. Previous XR negative for fracture.  -Also encouraged patient to keep axilla dry, elevate arm when able. Skin changes in underarm notable only for hyperpigmentation at this time, no concern for fungal infection currently.

## 2014-04-13 NOTE — Assessment & Plan Note (Signed)
Patient w/ extensive history of uncontrolled DM and medication non-compliance. Most recent HbA1c 12.1 during last clinic visit (03/25/14). Previously w/ CBG's in the 300's consistently. Increased Lantus to 10 in AM, 35 in PM. Today, CBG 196, significantly improved from previous.  -Continue Lantus 10 in AM , 35 in PM.  -Start Metformin 1000 mg daily. Had previously been on Metformin, stopped for an unknown reason, most likely 2/2 severe systolic CHF, now w/ improved EF. Feel it is reasonable to restart Metformin at this time.  -Patient to return in 2-4 weeks for recheck of blood sugars.

## 2014-04-15 ENCOUNTER — Ambulatory Visit: Payer: Medicaid Other | Attending: Physical Medicine & Rehabilitation | Admitting: Physical Therapy

## 2014-04-15 DIAGNOSIS — Z8673 Personal history of transient ischemic attack (TIA), and cerebral infarction without residual deficits: Secondary | ICD-10-CM | POA: Insufficient documentation

## 2014-04-15 DIAGNOSIS — IMO0001 Reserved for inherently not codable concepts without codable children: Secondary | ICD-10-CM | POA: Insufficient documentation

## 2014-04-15 DIAGNOSIS — R279 Unspecified lack of coordination: Secondary | ICD-10-CM | POA: Insufficient documentation

## 2014-04-15 NOTE — Progress Notes (Signed)
Case discussed with Dr. Jones at the time of the visit.  We reviewed the resident's history and exam and pertinent patient test results.  I agree with the assessment, diagnosis, and plan of care documented in the resident's note. 

## 2014-04-16 ENCOUNTER — Ambulatory Visit: Payer: Medicaid Other | Admitting: Physical Therapy

## 2014-04-16 ENCOUNTER — Telehealth: Payer: Self-pay

## 2014-04-16 NOTE — Telephone Encounter (Signed)
Patient called to say she feels jittery and anxious.  She would like to know what to do?

## 2014-04-16 NOTE — Telephone Encounter (Signed)
Needs to check bp and heart rate. Follow up with pcp initially if these are normal. Has she changed any medications or stopped any meds suddenly?  This might be a good patient for eunice to see if she can't get into her primary

## 2014-04-16 NOTE — Telephone Encounter (Signed)
Advised patient to check her blood pressure and heart rate.  Reccommended she buy one from a pharmacy.  She will also follow up with her primary care provider.

## 2014-04-18 ENCOUNTER — Other Ambulatory Visit: Payer: Self-pay | Admitting: *Deleted

## 2014-04-18 DIAGNOSIS — E1165 Type 2 diabetes mellitus with hyperglycemia: Principal | ICD-10-CM

## 2014-04-18 DIAGNOSIS — E1140 Type 2 diabetes mellitus with diabetic neuropathy, unspecified: Secondary | ICD-10-CM

## 2014-04-18 DIAGNOSIS — IMO0002 Reserved for concepts with insufficient information to code with codable children: Secondary | ICD-10-CM

## 2014-04-18 MED ORDER — ACCU-CHEK FASTCLIX LANCETS MISC: Status: DC

## 2014-04-22 ENCOUNTER — Other Ambulatory Visit (HOSPITAL_COMMUNITY): Payer: Self-pay

## 2014-04-22 ENCOUNTER — Telehealth: Payer: Self-pay

## 2014-04-22 MED ORDER — AMLODIPINE BESYLATE 5 MG PO TABS: 5.0000 mg | ORAL_TABLET | Freq: Every day | ORAL | Status: DC

## 2014-04-22 NOTE — Telephone Encounter (Signed)
Patient called requesting a refill on Oxycodone. Last fill date was 4/16. Patient has a appt. with Riley Lam on 4/15. Attempted to contact patient to inform her that she has a appt. on 4/15 @ 10:30 am. Left a voicemail for patient to return call to clinic.

## 2014-04-22 NOTE — Telephone Encounter (Signed)
[name] informed patient she has an appointment and can get her medication then.

## 2014-04-23 ENCOUNTER — Emergency Department (INDEPENDENT_AMBULATORY_CARE_PROVIDER_SITE_OTHER)
Admission: EM | Admit: 2014-04-23 | Discharge: 2014-04-23 | Disposition: A | Discharge status: Home / Self Care | Payer: Medicaid Other | Source: Home / Self Care | Attending: Family Medicine | Admitting: Family Medicine

## 2014-04-23 ENCOUNTER — Encounter (HOSPITAL_COMMUNITY): Payer: Self-pay | Admitting: Emergency Medicine

## 2014-04-23 DIAGNOSIS — L259 Unspecified contact dermatitis, unspecified cause: Secondary | ICD-10-CM

## 2014-04-23 DIAGNOSIS — R609 Edema, unspecified: Secondary | ICD-10-CM

## 2014-04-23 MED ORDER — FLUTICASONE PROPIONATE 0.05 % EX CREA: TOPICAL_CREAM | Freq: Two times a day (BID) | CUTANEOUS | Status: DC

## 2014-04-23 NOTE — ED Notes (Signed)
Pt. given 2 pieces of 6" wide stockinette to wear under her brace at night.  One to wear when other is washed.

## 2014-04-23 NOTE — ED Provider Notes (Signed)
CSN: [id number]     Arrival date & time 04/23/14  1852 History   First MD Initiated Contact with Patient 04/23/14 1920     Chief Complaint  Patient presents with  . Rash  . Hypertension   (Consider location/radiation/quality/duration/timing/severity/associated sxs/prior Treatment) Patient is a 48 y.o. female presenting with rash and hypertension. The history is provided by the patient.  Rash Location:  Shoulder/arm Shoulder/arm rash location:  L forearm Quality: dryness and itchiness   Severity:  Mild Onset quality:  Sudden Duration:  1 month Progression:  Unchanged Chronicity:  New (since wearing brace to arm.) Context comment:  Had left sided cva and left arm hanging dependently causing swelling. Hypertension    Past Medical History  Diagnosis Date  . Hypertension     Poorly controlled. Has had HTN since age 33. Angioedema with ACEI.  24 Hr urine and renal arterial dopplers ordered . . . Never done  . Type II or unspecified type diabetes mellitus without mention of complication, uncontrolled DX: 2002  . NICM (nonischemic cardiomyopathy)     EF 45-50% in 8/12, cath 6/12 showed normal coronaries, EF 50-55% by LV gram  . Morbid obesity   . Allergic rhinitis   . HLD (hyperlipidemia)   . OSA on CPAP     sleep study in 8/12 showed moderate to severe OSA requiring CPAP  . Chronic lower back pain     secondary to DJD, obsetiy, hip problems. Followed by Dr. Ivory Broad (pain management)  . 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 Chronic diastolic heart failure      Primarily diastolic CHF: Likely due to uncontrolled HTN. Last echo (8/12) with EF 45-50%, mild to moderate LVH with some asymmetric septal hypertrophy, RV normal size and systolic function. EF 50-55% by LV-gram in 6/12.   . Degeneration of lumbar or lumbosacral intervertebral disc   . Polyneuropathy in diabetes(357.2)   . Thoracic or lumbosacral neuritis or radiculitis, unspecified   . Calcifying  tendinitis of shoulder   . LBBB (left bundle branch block)   . Chronic combined systolic and diastolic CHF (congestive heart failure)     EF 40-45% by echo 12/06/2012  . Coronary artery disease     questionable. LHC 05/2011 showing normal coronaries // Followed at [facility], Dr. Shirlee Latch  . GERD (gastroesophageal reflux disease)    Past Surgical History  Procedure Laterality Date  . Breast biopsies Bilateral 2011    patient reports benign results  . Tubal ligation  05/31/1985  . Hand surgery Left   . Shoulder surgery Right   . Dental surgery      tumors removed   . Cardiac catheterization  05/2011  . Pacemaker/defibrillator placed  08/22/13 Left 08/22/13  . Tee without cardioversion N/A 01/14/2014    Procedure: TRANSESOPHAGEAL ECHOCARDIOGRAM (TEE);  Surgeon: Thurmon Fair, MD;  Location: [facility] ENDOSCOPY;  Service: Cardiovascular;  Laterality: N/A;   Family History  Problem Relation Age of Onset  . Heart disease Mother 44    Died of MI at age 3 yo  . Heart disease Paternal Grandmother     requiring pacemaker.  [name] Heart disease Paternal Grandfather 53    Died of MI at possibly age 65-53yo  . Stroke Paternal Grandfather   . Heart disease Father 42    MI age 104yo requiring stenting  . Kidney disease Mother     requiring dialysis  . Congestive Heart Failure Mother   . Diabetes Father   .  Diabetes Brother   . Heart disease Brother 21    MI at age 48 years old  . Breast cancer Paternal Aunt   . Breast cancer Maternal Grandmother    History  Substance Use Topics  . Smoking status: Never Smoker   . Smokeless tobacco: Never Used  . Alcohol Use: No   OB History   Grav Para Term Preterm Abortions TAB SAB Ect Mult Living                 Review of Systems  Unable to perform ROS Constitutional: Negative.   Musculoskeletal: Positive for joint swelling.  Skin: Positive for rash.    Allergies  Clindamycin; Enalapril maleate; and Lisinopril  Home Medications   Prior to  Admission medications   Medication Sig Start Date End Date Taking? Authorizing Provider  ACCU-CHEK FASTCLIX LANCETS MISC Use to check blood sugars three times daily.   Dx code 04/18/14   Levert Feinstein, MD  amLODipine (NORVASC) 5 MG tablet Take 1 tablet (5 mg total) by mouth daily. 04/22/14   Dolores Patty, MD  aspirin 325 MG tablet Take 325 mg by mouth daily.    Historical Provider, MD  carbamazepine (TEGRETOL) 200 MG tablet Take 200 mg by mouth 2 (two) times daily.    Historical Provider, MD  cloNIDine (CATAPRES) 0.2 MG tablet Take 0.2 mg by mouth 2 (two) times daily.    Historical Provider, MD  Diapers & Supplies MISC 1 03/25/14   Arturo Morton, MD  diclofenac sodium (VOLTAREN) 1 % GEL Apply 2 g topically 4 (four) times daily as needed (pain).    Historical Provider, MD  digoxin (LANOXIN) 0.125 MG tablet Take 0.125 mg by mouth daily.    Historical Provider, MD  diphenhydrAMINE-zinc acetate (BENADRYL) cream Apply topically 3 (three) times daily as needed for itching. 03/25/14   Arturo Morton, MD  furosemide (LASIX) 40 MG tablet Take 40 mg by mouth.    Historical Provider, MD  ibuprofen (ADVIL,MOTRIN) 200 MG tablet Take 400 mg by mouth 3 (three) times daily as needed for mild pain.    Historical Provider, MD  insulin glargine (LANTUS) 100 UNIT/ML injection Inject 0.1-0.3 mLs (10-30 Units total) into the skin 2 (two) times daily. Take 13 units in the morning, and 30 units at bedtime. 03/25/14   Arturo Morton, MD  metFORMIN (GLUCOPHAGE) 1000 MG tablet Take 1 tablet (1,000 mg total) by mouth daily with breakfast. 04/12/14 04/12/15  Courtney Paris, MD  metoprolol succinate (TOPROL-XL) 25 MG 24 hr tablet Take 25 mg by mouth 2 (two) times daily.    Historical Provider, MD  Multiple Vitamins-Iron (DAILY MULTIVITAMINS/IRON PO) Take 1 tablet by mouth daily.    Historical Provider, MD  oxyCODONE-acetaminophen (PERCOCET) 10-325 MG per tablet Take 1 tablet by mouth every 8 (eight) hours as needed for pain.  03/28/14   Jacalyn Lefevre, NP  pantoprazole (PROTONIX) 40 MG tablet Take 40 mg by mouth daily.    Historical Provider, MD  simvastatin (ZOCOR) 40 MG tablet Take 40 mg by mouth daily.    Historical Provider, MD  tamsulosin (FLOMAX) 0.4 MG CAPS capsule Take 0.4 mg by mouth daily after supper.    Historical Provider, MD  tiZANidine (ZANAFLEX) 4 MG tablet Take 1 tablet (4 mg total) by mouth 3 (three) times daily. 03/28/14   Jacalyn Lefevre, NP   BP 159/116  Pulse 113  Temp(Src) 97.5 F (36.4 C) (Oral)  Resp 20  SpO2 100%  LMP 03/27/2014 Physical  Exam  Nursing note and vitals reviewed. Constitutional: She is oriented to person, place, and time. She appears well-developed and well-nourished.  Musculoskeletal: She exhibits edema. She exhibits no tenderness.       Arms: Neurological: She is alert and oriented to person, place, and time.  Skin: Rash noted.    ED Course  Procedures (including critical care time) Labs Review Labs Reviewed - No data to display  Imaging Review No results found.   MDM   1. Chronic contact dermatitis   2. Dependent edema        Linna Hoff, MD 04/23/14 1944

## 2014-04-23 NOTE — ED Notes (Signed)
C/o swelling increasing in her L arm since stroke 1 month ago.  Rash on inner aspect of forearm ever since she was in the hospital when she was wearing a brace. Takes it off every morning. Used to have a sleeve under splint was taken off by the hospital.  Rash is getting worse and spreading up her arm.  Has ice pack on arm.  Has discoloration of her skin in L axilla.  BP elevated on assessment.  Had a headache earlier in the day. Has not missed any BP meds.

## 2014-04-23 NOTE — Discharge Instructions (Signed)
Wear sleeve under brace to protect skin,  Keep arm elevated at all times on pillows

## 2014-04-25 ENCOUNTER — Ambulatory Visit: Payer: Medicaid Other

## 2014-04-25 DIAGNOSIS — IMO0001 Reserved for inherently not codable concepts without codable children: Secondary | ICD-10-CM | POA: Diagnosis not present

## 2014-04-25 DIAGNOSIS — Z8673 Personal history of transient ischemic attack (TIA), and cerebral infarction without residual deficits: Secondary | ICD-10-CM | POA: Diagnosis not present

## 2014-04-25 DIAGNOSIS — R279 Unspecified lack of coordination: Secondary | ICD-10-CM | POA: Diagnosis not present

## 2014-04-26 ENCOUNTER — Other Ambulatory Visit: Payer: Self-pay | Admitting: Internal Medicine

## 2014-04-26 ENCOUNTER — Other Ambulatory Visit: Payer: Self-pay | Admitting: Physical Medicine & Rehabilitation

## 2014-04-26 ENCOUNTER — Encounter: Payer: Self-pay | Admitting: Registered Nurse

## 2014-04-26 ENCOUNTER — Encounter: Payer: Medicaid Other | Attending: Physical Medicine & Rehabilitation | Admitting: Registered Nurse

## 2014-04-26 VITALS — BP 126/69 | HR 110 | Resp 14 | Ht 69.0 in | Wt 268.0 lb

## 2014-04-26 DIAGNOSIS — IMO0002 Reserved for concepts with insufficient information to code with codable children: Secondary | ICD-10-CM

## 2014-04-26 DIAGNOSIS — E1149 Type 2 diabetes mellitus with other diabetic neurological complication: Secondary | ICD-10-CM

## 2014-04-26 DIAGNOSIS — Z79899 Other long term (current) drug therapy: Secondary | ICD-10-CM

## 2014-04-26 DIAGNOSIS — G8110 Spastic hemiplegia affecting unspecified side: Secondary | ICD-10-CM

## 2014-04-26 DIAGNOSIS — Z5181 Encounter for therapeutic drug level monitoring: Secondary | ICD-10-CM

## 2014-04-26 DIAGNOSIS — I6350 Cerebral infarction due to unspecified occlusion or stenosis of unspecified cerebral artery: Secondary | ICD-10-CM

## 2014-04-26 DIAGNOSIS — M1710 Unilateral primary osteoarthritis, unspecified knee: Secondary | ICD-10-CM | POA: Insufficient documentation

## 2014-04-26 MED ORDER — OXYCODONE-ACETAMINOPHEN 10-325 MG PO TABS: 1.0000 | ORAL_TABLET | Freq: Three times a day (TID) | ORAL | Status: DC | PRN

## 2014-04-26 NOTE — Progress Notes (Signed)
During next [facility] follow up visit, patient needs pap smear, pneumococcal vaccine, and/or Tdap.   Signed: Courtney Paris, MD 04/26/2014 10:08 AM

## 2014-04-26 NOTE — Progress Notes (Signed)
Subjective:    Patient ID: [name], female    DOB: 1966-04-17, 48 y.o.   MRN: [id number]  HPI: [name] was a 48 year old female who returns for follow up for chronic pain and medication refill. She says her pain is located in her left arm, left shoulder and lower back. She rates her pain 8. Her current exercise regime is stretching exercises. She denies any falls, she sates "when she is at home she uses the wall to guide her. She says" the therapist from [address] told us to order her a cane". Last visit she told this writer she had a cane. Gilmer Mor has been ordered and sling for left arm. She has noticed more spasms in her left arm and asking for information regarding Botox. Botox pamphlet given and next visit with Dr. Riley Kill.  Pain Inventory Average Pain 8 Pain Right Now 8 My pain is intermittent and aching  In the last 24 hours, has pain interfered with the following? General activity 8 Relation with others 8 Enjoyment of life 8 What TIME of day is your pain at its worst? night Sleep (in general) Fair  Pain is worse with: walking, bending, sitting, inactivity, standing and some activites Pain improves with: medication Relief from Meds: 10  Mobility walk with assistance use a cane use a walker how many minutes can you walk? few minutes ability to climb steps?  no do you drive?  no needs help with transfers Do you have any goals in this area?  yes  Function I need assistance with the following:  dressing, bathing, toileting, meal prep, household duties and shopping  Neuro/Psych bladder control problems weakness tremor tingling trouble walking spasms dizziness loss of taste or smell  Prior Studies Any changes since last visit?  no  Physicians involved in your care Any changes since last visit?  no   Family History  Problem Relation Age of Onset  . Heart disease Mother 46    Died of MI at age 55 yo  . Heart disease Paternal Grandmother     requiring pacemaker.  [name] Heart disease Paternal Grandfather 30    Died of MI at possibly age 24-53yo  . Stroke Paternal Grandfather   . Heart disease Father 38    MI age 60yo requiring stenting  . Kidney disease Mother     requiring dialysis  . Congestive Heart Failure Mother   . Diabetes Father   . Diabetes Brother   . Heart disease Brother 27    MI at age 59 years old  . Breast cancer Paternal Aunt   . Breast cancer Maternal Grandmother    History   Social History  . Marital Status: Single    Spouse Name: N/A    Number of Children: 2  . Years of Education: 9th grade   Occupational History  . Unemployed     planning on getting disability   Social History Main Topics  . Smoking status: Never Smoker   . Smokeless tobacco: Never Used  . Alcohol Use: No  . Drug Use: No  . Sexual Activity: Yes    Birth Control/ Protection: Surgical   Other Topics Concern  . None   Social History Narrative   Lives in [place] with her son. Is able to read and write fluently in [place].   Past Surgical History  Procedure Laterality Date  . Breast biopsies Bilateral 2011    patient reports benign results  . Tubal ligation  05/31/1985  . Hand surgery Left   . Shoulder surgery Right   . Dental surgery      tumors removed   . Cardiac catheterization  05/2011  . Pacemaker/defibrillator placed  08/22/13 Left 08/22/13  . Tee without cardioversion N/A 01/14/2014    Procedure: TRANSESOPHAGEAL ECHOCARDIOGRAM (TEE);  Surgeon: Thurmon Fair, MD;  Location: [facility] ENDOSCOPY;  Service: Cardiovascular;  Laterality: N/A;   Past Medical History  Diagnosis Date  . Hypertension     Poorly controlled. Has had HTN since age 32. Angioedema with ACEI.  24 Hr urine and renal arterial dopplers ordered . . . Never done  . Type II or unspecified type diabetes mellitus without mention of complication, uncontrolled DX: 2002  . NICM (nonischemic cardiomyopathy)     EF 45-50% in 8/12, cath 6/12 showed normal  coronaries, EF 50-55% by LV gram  . Morbid obesity   . Allergic rhinitis   . HLD (hyperlipidemia)   . OSA on CPAP     sleep study in 8/12 showed moderate to severe OSA requiring CPAP  . Chronic lower back pain     secondary to DJD, obsetiy, hip problems. Followed by Dr. Ivory Broad (pain management)  . 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 Chronic diastolic heart failure      Primarily diastolic CHF: Likely due to uncontrolled HTN. Last echo (8/12) with EF 45-50%, mild to moderate LVH with some asymmetric septal hypertrophy, RV normal size and systolic function. EF 50-55% by LV-gram in 6/12.   . Degeneration of lumbar or lumbosacral intervertebral disc   . Polyneuropathy in diabetes(357.2)   . Thoracic or lumbosacral neuritis or radiculitis, unspecified   . Calcifying tendinitis of shoulder   . LBBB (left bundle branch block)   . Chronic combined systolic and diastolic CHF (congestive heart failure)     EF 40-45% by echo 12/06/2012  . Coronary artery disease     questionable. LHC 05/2011 showing normal coronaries // Followed at [facility], Dr. Shirlee Latch  . GERD (gastroesophageal reflux disease)    BP 126/69  Pulse 110  Resp 14  Ht 5\' 9"  (1.753 m)  Wt 268 lb (121.564 kg)  BMI 39.56 kg/m2  SpO2 98%  LMP 04/26/2014  Opioid Risk Score:   Fall Risk Score: High Fall Risk (>13 points) (pt educated on fall risk, brochure given to pt previously)    Review of Systems  Constitutional: Positive for unexpected weight change.  HENT:       Loss of smell  Respiratory: Positive for apnea.   Cardiovascular: Positive for leg swelling.  Gastrointestinal: Positive for constipation.  Genitourinary:       Bladder control problems  Musculoskeletal: Positive for gait problem.  Skin: Positive for rash.  Neurological: Positive for tremors and weakness.       Tingling, spasms  All other systems reviewed and are negative.      Objective:   Physical Exam  Nursing  note and vitals reviewed. Constitutional: She is oriented to person, place, and time. She appears well-developed and well-nourished.  HENT:  Head: Normocephalic.  Neck: Normal range of motion. Neck supple.  Cervical Paraspinal Tenderness Noted: C-3- C-5  Cardiovascular: Normal rate and regular rhythm.   Pulmonary/Chest: Effort normal and breath sounds normal.  Musculoskeletal:  Normal Muscle Bulk: Muscle Testing Reveals: Upper Extremities: Right Arm Full ROM and Muscle Strength 4/5. Left arm flaccid/ Muscle Strength 0/5. Paretic with spasticity noted with movement. She's carrying left  arm in a protective position. Thoracic Paraspinal Tenderness Noted: T- 7- T-9  ( Left side) Lumbar Paraspinal Tenderness Noted ; L- 4- L-5 Lower Extremities: Right Leg Full ROM and Muscle strength 5/5. Left Leg: Paralysis Noted/ Muscle Strength: 0/5 Arrived in Wheelchair   Neurological: She is alert and oriented to person, place, and time.  Skin: Skin is warm and dry.  Psychiatric: She has a normal mood and affect.          Assessment & Plan:  1. Chronic low back pain: Continue Current Medication Regime. Refilled: oxyCODONE 10/325mg  one tablet every 8 hours as needed.#90  2. Diabetic peripheral neuropathy: Continue to monitor 3. Recent Right MCA infarct: Left arm with spasms and flaccid. Left arm sling ordered and cane ordered.  Continue stretching exercises, On Zanaflex. Wants Botox injection. F/U appointment with Dr. Riley Kill. 4. OA: Continue Voltaren gel    30 minutes of face to face patient care time was spent during this visit. All questions were encouraged and answered. F/U with Dr. Wilburt Finlay for Botox injection.

## 2014-04-29 ENCOUNTER — Ambulatory Visit: Payer: Self-pay | Admitting: Nurse Practitioner

## 2014-04-30 ENCOUNTER — Telehealth: Payer: Self-pay | Admitting: Nurse Practitioner

## 2014-04-30 NOTE — Telephone Encounter (Signed)
Patient was no show for today's office appointment.  

## 2014-05-03 ENCOUNTER — Telehealth: Payer: Self-pay | Admitting: Physical Medicine & Rehabilitation

## 2014-05-03 DIAGNOSIS — I639 Cerebral infarction, unspecified: Secondary | ICD-10-CM

## 2014-05-03 NOTE — Telephone Encounter (Signed)
Occupational Therapy rx written per request of PT

## 2014-05-08 ENCOUNTER — Telehealth: Payer: Self-pay

## 2014-05-08 ENCOUNTER — Ambulatory Visit (HOSPITAL_COMMUNITY)
Admission: RE | Admit: 2014-05-08 | Discharge: 2014-05-08 | Disposition: A | Discharge status: Home / Self Care | Payer: Medicaid Other | Source: Ambulatory Visit | Attending: Internal Medicine | Admitting: Internal Medicine

## 2014-05-08 VITALS — BP 170/88 | HR 98 | Wt 264.4 lb

## 2014-05-08 DIAGNOSIS — I2510 Atherosclerotic heart disease of native coronary artery without angina pectoris: Secondary | ICD-10-CM | POA: Insufficient documentation

## 2014-05-08 DIAGNOSIS — Z794 Long term (current) use of insulin: Secondary | ICD-10-CM | POA: Diagnosis not present

## 2014-05-08 DIAGNOSIS — G8929 Other chronic pain: Secondary | ICD-10-CM | POA: Diagnosis not present

## 2014-05-08 DIAGNOSIS — G4733 Obstructive sleep apnea (adult) (pediatric): Secondary | ICD-10-CM | POA: Insufficient documentation

## 2014-05-08 DIAGNOSIS — I447 Left bundle-branch block, unspecified: Secondary | ICD-10-CM | POA: Diagnosis not present

## 2014-05-08 DIAGNOSIS — I428 Other cardiomyopathies: Secondary | ICD-10-CM | POA: Diagnosis not present

## 2014-05-08 DIAGNOSIS — M5137 Other intervertebral disc degeneration, lumbosacral region: Secondary | ICD-10-CM | POA: Insufficient documentation

## 2014-05-08 DIAGNOSIS — Z79899 Other long term (current) drug therapy: Secondary | ICD-10-CM | POA: Insufficient documentation

## 2014-05-08 DIAGNOSIS — E1142 Type 2 diabetes mellitus with diabetic polyneuropathy: Secondary | ICD-10-CM | POA: Diagnosis not present

## 2014-05-08 DIAGNOSIS — E1149 Type 2 diabetes mellitus with other diabetic neurological complication: Secondary | ICD-10-CM | POA: Insufficient documentation

## 2014-05-08 DIAGNOSIS — I509 Heart failure, unspecified: Secondary | ICD-10-CM | POA: Diagnosis not present

## 2014-05-08 DIAGNOSIS — R29898 Other symptoms and signs involving the musculoskeletal system: Secondary | ICD-10-CM | POA: Insufficient documentation

## 2014-05-08 DIAGNOSIS — I5022 Chronic systolic (congestive) heart failure: Secondary | ICD-10-CM | POA: Diagnosis not present

## 2014-05-08 DIAGNOSIS — I69998 Other sequelae following unspecified cerebrovascular disease: Secondary | ICD-10-CM | POA: Diagnosis not present

## 2014-05-08 DIAGNOSIS — I10 Essential (primary) hypertension: Secondary | ICD-10-CM

## 2014-05-08 DIAGNOSIS — K219 Gastro-esophageal reflux disease without esophagitis: Secondary | ICD-10-CM | POA: Diagnosis not present

## 2014-05-08 DIAGNOSIS — Z7982 Long term (current) use of aspirin: Secondary | ICD-10-CM | POA: Diagnosis not present

## 2014-05-08 DIAGNOSIS — M51379 Other intervertebral disc degeneration, lumbosacral region without mention of lumbar back pain or lower extremity pain: Secondary | ICD-10-CM | POA: Insufficient documentation

## 2014-05-08 DIAGNOSIS — I6350 Cerebral infarction due to unspecified occlusion or stenosis of unspecified cerebral artery: Secondary | ICD-10-CM

## 2014-05-08 DIAGNOSIS — I639 Cerebral infarction, unspecified: Secondary | ICD-10-CM

## 2014-05-08 DIAGNOSIS — G8110 Spastic hemiplegia affecting unspecified side: Secondary | ICD-10-CM

## 2014-05-08 DIAGNOSIS — I5042 Chronic combined systolic (congestive) and diastolic (congestive) heart failure: Secondary | ICD-10-CM

## 2014-05-08 DIAGNOSIS — E785 Hyperlipidemia, unspecified: Secondary | ICD-10-CM

## 2014-05-08 LAB — BASIC METABOLIC PANEL
BUN: 7 mg/dL (ref 6–23)
CALCIUM: 9.8 mg/dL (ref 8.4–10.5)
CO2: 27 mEq/L (ref 19–32)
Chloride: 100 mEq/L (ref 96–112)
Creatinine, Ser: 0.67 mg/dL (ref 0.50–1.10)
GFR calc non Af Amer: >90 mL/min (ref >90)
Glucose, Bld: 119 mg/dL — ABNORMAL HIGH (ref 70–99)
Potassium: 4 mEq/L (ref 3.7–5.3)
Sodium: 139 mEq/L (ref 137–147)

## 2014-05-08 LAB — PRO B NATRIURETIC PEPTIDE: PRO B NATRI PEPTIDE: 233.9 pg/mL — AB (ref 0–125)

## 2014-05-08 LAB — DIGOXIN LEVEL: Digoxin Level: <0.3 ng/mL — ABNORMAL LOW (ref 0.8–2.0)

## 2014-05-08 MED ORDER — METOPROLOL SUCCINATE ER 25 MG PO TB24: ORAL_TABLET | ORAL | Status: DC

## 2014-05-08 NOTE — Patient Instructions (Signed)
Increase your Toprol to 50 gm (2 tablets) in the morning and 25 mg (1 tablet) in the evening. Call any dizziness.  Take all your medications as prescribed.  Follow up in 1 month  .Do the following things EVERYDAY: 1) Weigh yourself in the morning before breakfast. Write it down and keep it in a log. 2) Take your medicines as prescribed 3) Eat low salt foods-Limit salt (sodium) to 2000 mg per day.  4) Stay as active as you can everyday Limit all fluids for the day to less than 2 liters

## 2014-05-08 NOTE — Telephone Encounter (Signed)
Patient lost rx for sling and walker.  She was wondering if she could get a new rx.  Patient will pick up when ready.

## 2014-05-08 NOTE — Progress Notes (Signed)
Patient ID: [name], female   DOB: Sep 01, 1966, 48 y.o.   MRN: [id number]   Weight Range  282 pounds  Baseline proBNP   EP: Dr Graciela Husbands PCP: Dr Yetta Barre or Dr. Lovenia Kim internal medicine  Angioedema with ACE-I  --Optivol--  HPI: 48 yo with history of morbid obesity, HTN, HLD, CAD, DM2, OSA on CPAP, CVA and combined CHF in the setting of poorly-controlled HTN. She was admitted in 05/2011 with chest pain and new LBBB and ended up getting a left heart cath, which showed no angiographic CAD. Renal artery dopplers were normal in 04/2012.   Was hospitalized May 2014 with acute on chronic combined systolic and diastolic CHF. EF from December 2013 was down to 40 to 45%.   Admitted in January 2015 with slurred speech, L arm weakness and facial droop. Initial head CT showed no acute abnormality. Repeat head CT showed subtle gray white matter differentiation in R frontal lobe and R operculum perhaps representing ischemic R sided MCA. STAT repeat CT head later on 1/29 showed further evolution of R MCA territory infarct involving R insular cortex and operculum. No hemorrhage. Neurology recommended CTA head/neck, which showed occlusion of MCA branch to the area of R MCA infarct and a normal neck CTA. TEE was done and did not reveal embolic source. Was discharged to inpatient rehab.   Follow up for HF: Since last visit had R MCA. Patient is not sure what medications she is taking and did not bring them with her. Denies SOB, orthopnea or CP. L hand and leg swelling. Not able to weigh at home. No longer has [facility] Charity fundraiser. Not participating in rehab but reports Dr. Cato Mulligan office is working on this.  Reports following low salt diet and drinking less than 2L a day.  ECHO 11/2012: 40-45% ECHO  07/2013: 20% RV normal ECHO    08/23/2013: EF 40-45%, diff HK ECHO   09/05/2013: EF 25-30%, diff HK  ECHO  12/11/13: EF 30%, diff HK, RV normal ECHO 01/14/2014: EF 35-40%, LA mod dilated   Labs 06/2013: proBNP 230, Cr 0.9, K 3.7        08/2013: K+ 3.9, Cr 0.9       09/2013: K 4.2, Cr 0.86, pro-BNP 718, dig level <0.3     10/07/13: K+ 4.1, Cr 0.83  ROS: All systems negative except as listed in HPI, PMH and Problem List.  Past Medical History  Diagnosis Date  . Hypertension     Poorly controlled. Has had HTN since age 28. Angioedema with ACEI.  24 Hr urine and renal arterial dopplers ordered . . . Never done  . Type II or unspecified type diabetes mellitus without mention of complication, uncontrolled DX: 2002  . NICM (nonischemic cardiomyopathy)     EF 45-50% in 8/12, cath 6/12 showed normal coronaries, EF 50-55% by LV gram  . Morbid obesity   . Allergic rhinitis   . HLD (hyperlipidemia)   . OSA on CPAP     sleep study in 8/12 showed moderate to severe OSA requiring CPAP  . Chronic lower back pain     secondary to DJD, obsetiy, hip problems. Followed by Dr. Ivory Broad (pain management)  . 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 Chronic diastolic heart failure      Primarily diastolic CHF: Likely due to uncontrolled HTN. Last echo (8/12) with EF 45-50%, mild to moderate LVH with some asymmetric septal hypertrophy, RV normal size and systolic  function. EF 50-55% by LV-gram in 6/12.   . Degeneration of lumbar or lumbosacral intervertebral disc   . Polyneuropathy in diabetes(357.2)   . Thoracic or lumbosacral neuritis or radiculitis, unspecified   . Calcifying tendinitis of shoulder   . LBBB (left bundle branch block)   . Chronic combined systolic and diastolic CHF (congestive heart failure)     EF 40-45% by echo 12/06/2012  . Coronary artery disease     questionable. LHC 05/2011 showing normal coronaries // Followed at [facility], Dr. Shirlee Latch  . GERD (gastroesophageal reflux disease)     Current Outpatient Prescriptions  Medication Sig Dispense Refill  . ACCU-CHEK FASTCLIX LANCETS MISC Use to check blood sugars three times daily.   Dx code  102 each  11  . amLODipine (NORVASC) 5 MG  tablet Take 1 tablet (5 mg total) by mouth daily.  30 tablet  3  . aspirin 325 MG tablet Take 325 mg by mouth daily.      . carbamazepine (TEGRETOL) 200 MG tablet Take 200 mg by mouth 2 (two) times daily.      . cloNIDine (CATAPRES) 0.2 MG tablet Take 0.2 mg by mouth 2 (two) times daily.      . cyclobenzaprine (FLEXERIL) 10 MG tablet Take 10 mg by mouth 3 (three) times daily as needed for muscle spasms.      . Diapers & Supplies MISC 1  2 each  6  . diclofenac sodium (VOLTAREN) 1 % GEL Apply 2 g topically 4 (four) times daily as needed (pain).      [name] digoxin (LANOXIN) 0.125 MG tablet Take 0.125 mg by mouth daily.      . diphenhydrAMINE-zinc acetate (BENADRYL) cream Apply topically 3 (three) times daily as needed for itching.  28.3 g  0  . furosemide (LASIX) 40 MG tablet Take 40 mg by mouth.      . hydrALAZINE (APRESOLINE) 100 MG tablet Take 100 mg by mouth 3 (three) times daily.      [name] ibuprofen (ADVIL,MOTRIN) 200 MG tablet Take 400 mg by mouth 3 (three) times daily as needed for mild pain.      [name] insulin glargine (LANTUS) 100 UNIT/ML injection Inject 10-30 Units into the skin 2 (two) times daily. Take 10 units in the morning, and 35 units at bedtime.      . metFORMIN (GLUCOPHAGE) 1000 MG tablet Take 1 tablet (1,000 mg total) by mouth daily with breakfast.  30 tablet  11  . metoprolol succinate (TOPROL-XL) 25 MG 24 hr tablet Take 25 mg by mouth 2 (two) times daily.      [name] oxyCODONE-acetaminophen (PERCOCET) 10-325 MG per tablet Take 1 tablet by mouth every 8 (eight) hours as needed for pain.  90 tablet  0  . pantoprazole (PROTONIX) 40 MG tablet Take 40 mg by mouth daily.      . simvastatin (ZOCOR) 40 MG tablet Take 40 mg by mouth daily.      . tamsulosin (FLOMAX) 0.4 MG CAPS capsule Take 0.4 mg by mouth daily after supper.      [name] tiZANidine (ZANAFLEX) 2 MG tablet Take 2 mg by mouth 2 (two) times daily.       No current facility-administered medications for this encounter.   Filed Vitals:    05/08/14 1449  BP: 170/88  Pulse: 98  Weight: 264 lb 6 oz (119.92 kg)  SpO2: 99%   PHYSICAL EXAM: General:  NAD. No resp difficulty; aid with her HEENT: normal Neck: supple.  Thick. Hard to see JVP, but appears mildly elevated. Carotids 2+ bilaterally; no bruits. No lymphadenopathy appreciated.  Cor: PMI nonpalpable; Regular rate & rhythm. No rubs, gallops or murmurs. Split s2 Lungs: clear Abdomen: obese soft, nontender, mildly distended. No hepatosplenomegaly. No bruits or masses. Good bowel sounds. Extremities: no cyanosis, clubbing, rash, trace edema Neuro: alert & orientedx3, cranial nerves grossly intact. Moves all 4 extremities w/o difficulty. Affect pleasant.   ASSESSMENT:  1. Chronic systolic HF, NICM likely r/t HTN, EF 35-40% (01/2014) s/p CRT-D-[employer] - NYHA II symptoms and volume status stable. Will continue lasix 40 mg daily. Optivol shows volume way above threshold, however do not think it is accurate and that she may have had lead dislodged. Minimal pt activity less than 1 hr a day and pacing > 95% a day. - BP still seems to be major issue and patient reports not knowing how to take medications correctly. She no longer has HHRN, will order to see if they can help with medications and if not will use Susie from paramedicine for a couple of weeks to help patient. This has been an ongoing issue even before her stroke. - Will increase Toprol to 50 mg q am and 25 mg q pm. Relabeled her bottle for her and sent a new Rx.  - Continue current doses of digoxin, hydralazine and amlodipine. She is not on Imdur and need to address at next visit and could start 60 mg daily. - She is not on ACE-I d/t allergy can consider starting ARB next visit if  BMET today stable. Check dig and pro-BNP today.  - Reinforced the need and importance of daily weights, a low sodium diet, and fluid restriction (less than 2 L a day). Instructed to call the HF clinic if weight increases more than 3 lbs overnight  or 5 lbs in a week.  2. Uncontrolled HTN - As above will increase Toprol to 50 mg q am and 25 mg q pm. 3. Morbid obesity - Encouraged weight loss with portion control.  4. OSA on CPAP -Reports taking CPAP nightly 5. R CVA - Reviewed discharge summary from recent hospitalization in January for CVA. She is on BB, statin and ASA. Remains weak on L side but reports she is getting better.   F/U 1 month Aundria Rud NP-C 3:31 PM

## 2014-05-09 NOTE — Telephone Encounter (Addendum)
Unable to reprint order for cane and sling.  Will reorder on rx pad.

## 2014-05-10 NOTE — Progress Notes (Signed)
Urine drug screen from 04/26/2014 was consistent.

## 2014-05-11 ENCOUNTER — Emergency Department (HOSPITAL_COMMUNITY)
Admission: EM | Admit: 2014-05-11 | Discharge: 2014-05-11 | Disposition: A | Discharge status: Home / Self Care | Payer: Medicaid Other | Attending: Emergency Medicine | Admitting: Emergency Medicine

## 2014-05-11 ENCOUNTER — Encounter (HOSPITAL_COMMUNITY): Payer: Self-pay | Admitting: Emergency Medicine

## 2014-05-11 DIAGNOSIS — M79609 Pain in unspecified limb: Secondary | ICD-10-CM | POA: Insufficient documentation

## 2014-05-11 DIAGNOSIS — M1610 Unilateral primary osteoarthritis, unspecified hip: Secondary | ICD-10-CM | POA: Insufficient documentation

## 2014-05-11 DIAGNOSIS — Z950 Presence of cardiac pacemaker: Secondary | ICD-10-CM | POA: Insufficient documentation

## 2014-05-11 DIAGNOSIS — Z79899 Other long term (current) drug therapy: Secondary | ICD-10-CM | POA: Insufficient documentation

## 2014-05-11 DIAGNOSIS — E785 Hyperlipidemia, unspecified: Secondary | ICD-10-CM | POA: Insufficient documentation

## 2014-05-11 DIAGNOSIS — Z9981 Dependence on supplemental oxygen: Secondary | ICD-10-CM | POA: Insufficient documentation

## 2014-05-11 DIAGNOSIS — I5042 Chronic combined systolic (congestive) and diastolic (congestive) heart failure: Secondary | ICD-10-CM | POA: Insufficient documentation

## 2014-05-11 DIAGNOSIS — I2510 Atherosclerotic heart disease of native coronary artery without angina pectoris: Secondary | ICD-10-CM | POA: Insufficient documentation

## 2014-05-11 DIAGNOSIS — M7989 Other specified soft tissue disorders: Secondary | ICD-10-CM | POA: Insufficient documentation

## 2014-05-11 DIAGNOSIS — M5137 Other intervertebral disc degeneration, lumbosacral region: Secondary | ICD-10-CM | POA: Insufficient documentation

## 2014-05-11 DIAGNOSIS — I447 Left bundle-branch block, unspecified: Secondary | ICD-10-CM | POA: Insufficient documentation

## 2014-05-11 DIAGNOSIS — E1142 Type 2 diabetes mellitus with diabetic polyneuropathy: Secondary | ICD-10-CM | POA: Insufficient documentation

## 2014-05-11 DIAGNOSIS — M51379 Other intervertebral disc degeneration, lumbosacral region without mention of lumbar back pain or lower extremity pain: Secondary | ICD-10-CM | POA: Insufficient documentation

## 2014-05-11 DIAGNOSIS — M79602 Pain in left arm: Secondary | ICD-10-CM

## 2014-05-11 DIAGNOSIS — G4733 Obstructive sleep apnea (adult) (pediatric): Secondary | ICD-10-CM | POA: Insufficient documentation

## 2014-05-11 DIAGNOSIS — Z9889 Other specified postprocedural states: Secondary | ICD-10-CM | POA: Insufficient documentation

## 2014-05-11 DIAGNOSIS — I428 Other cardiomyopathies: Secondary | ICD-10-CM | POA: Insufficient documentation

## 2014-05-11 DIAGNOSIS — Z794 Long term (current) use of insulin: Secondary | ICD-10-CM | POA: Insufficient documentation

## 2014-05-11 DIAGNOSIS — E1149 Type 2 diabetes mellitus with other diabetic neurological complication: Secondary | ICD-10-CM | POA: Insufficient documentation

## 2014-05-11 DIAGNOSIS — Z8673 Personal history of transient ischemic attack (TIA), and cerebral infarction without residual deficits: Secondary | ICD-10-CM | POA: Insufficient documentation

## 2014-05-11 DIAGNOSIS — R6883 Chills (without fever): Secondary | ICD-10-CM | POA: Insufficient documentation

## 2014-05-11 DIAGNOSIS — K219 Gastro-esophageal reflux disease without esophagitis: Secondary | ICD-10-CM | POA: Insufficient documentation

## 2014-05-11 DIAGNOSIS — Z7982 Long term (current) use of aspirin: Secondary | ICD-10-CM | POA: Insufficient documentation

## 2014-05-11 DIAGNOSIS — G8929 Other chronic pain: Secondary | ICD-10-CM | POA: Insufficient documentation

## 2014-05-11 DIAGNOSIS — M169 Osteoarthritis of hip, unspecified: Secondary | ICD-10-CM | POA: Insufficient documentation

## 2014-05-11 MED ORDER — KETOROLAC TROMETHAMINE 15 MG/ML IJ SOLN
15.0000 mg | Freq: Once | INTRAMUSCULAR | Status: AC
  Administered 2014-05-11: 15 mg via INTRAMUSCULAR
  Filled 2014-05-11: qty 1

## 2014-05-11 MED ORDER — GABAPENTIN 300 MG PO CAPS: 300.0000 mg | ORAL_CAPSULE | Freq: Three times a day (TID) | ORAL | Status: DC

## 2014-05-11 NOTE — Discharge Instructions (Signed)
Call for a follow up appointment with a Family or Primary Care Provider.  Elevated your left arm as previous instructed to do so. Return if Symptoms worsen.   Take medication as prescribed.

## 2014-05-11 NOTE — ED Provider Notes (Signed)
CSN: [id number]     Arrival date & time 05/11/14  0106 History   First MD Initiated Contact with Patient 05/11/14 0448     Chief Complaint  Patient presents with  . Hand Pain     (Consider location/radiation/quality/duration/timing/severity/associated sxs/prior Treatment) HPI Comments: The patient is a 48 year old female with a past medical history of CVA deficits include Left sided weakness presenting with left arm pain for one week.  The patient reports left arm swelling since January. An increase in discomfort in the left arm for one week. She reports taking oxycodone without full resolution of symptoms.  She reports no movement in the left upper extremity since January. The patient describes the discomfort as pins and needles. No aggravating or relieving factors.  Denies recent injury or fall. PCP: Lars Masson, MD  Patient is a 48 y.o. female presenting with hand pain. The history is provided by medical records and the patient. No language interpreter was used.  Hand Pain Associated symptoms include chills, numbness and weakness. Pertinent negatives include no abdominal pain, chest pain, coughing, fever, nausea or vomiting.    Past Medical History  Diagnosis Date  . Hypertension     Poorly controlled. Has had HTN since age 20. Angioedema with ACEI.  24 Hr urine and renal arterial dopplers ordered . . . Never done  . Type II or unspecified type diabetes mellitus without mention of complication, uncontrolled DX: 2002  . NICM (nonischemic cardiomyopathy)     EF 45-50% in 8/12, cath 6/12 showed normal coronaries, EF 50-55% by LV gram  . Morbid obesity   . Allergic rhinitis   . HLD (hyperlipidemia)   . OSA on CPAP     sleep study in 8/12 showed moderate to severe OSA requiring CPAP  . Chronic lower back pain     secondary to DJD, obsetiy, hip problems. Followed by Dr. Ivory Broad (pain management)  . 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 Chronic diastolic heart  failure      Primarily diastolic CHF: Likely due to uncontrolled HTN. Last echo (8/12) with EF 45-50%, mild to moderate LVH with some asymmetric septal hypertrophy, RV normal size and systolic function. EF 50-55% by LV-gram in 6/12.   . Degeneration of lumbar or lumbosacral intervertebral disc   . Polyneuropathy in diabetes(357.2)   . Thoracic or lumbosacral neuritis or radiculitis, unspecified   . Calcifying tendinitis of shoulder   . LBBB (left bundle branch block)   . Chronic combined systolic and diastolic CHF (congestive heart failure)     EF 40-45% by echo 12/06/2012  . Coronary artery disease     questionable. LHC 05/2011 showing normal coronaries // Followed at [facility], Dr. Shirlee Latch  . GERD (gastroesophageal reflux disease)    Past Surgical History  Procedure Laterality Date  . Breast biopsies Bilateral 2011    patient reports benign results  . Tubal ligation  05/31/1985  . Hand surgery Left   . Shoulder surgery Right   . Dental surgery      tumors removed   . Cardiac catheterization  05/2011  . Pacemaker/defibrillator placed  08/22/13 Left 08/22/13  . Tee without cardioversion N/A 01/14/2014    Procedure: TRANSESOPHAGEAL ECHOCARDIOGRAM (TEE);  Surgeon: Thurmon Fair, MD;  Location: [facility] ENDOSCOPY;  Service: Cardiovascular;  Laterality: N/A;   Family History  Problem Relation Age of Onset  . Heart disease Mother 23    Died of MI at age 71 yo  .  Heart disease Paternal Grandmother     requiring pacemaker.  [name] Heart disease Paternal Grandfather 44    Died of MI at possibly age 53-53yo  . Stroke Paternal Grandfather   . Heart disease Father 81    MI age 73yo requiring stenting  . Kidney disease Mother     requiring dialysis  . Congestive Heart Failure Mother   . Diabetes Father   . Diabetes Brother   . Heart disease Brother 72    MI at age 46 years old  . Breast cancer Paternal Aunt   . Breast cancer Maternal Grandmother    History  Substance Use Topics  .  Smoking status: Never Smoker   . Smokeless tobacco: Never Used  . Alcohol Use: No   OB History   Grav Para Term Preterm Abortions TAB SAB Ect Mult Living                 Review of Systems  Constitutional: Positive for chills. Negative for fever.  Respiratory: Negative for cough and shortness of breath.   Cardiovascular: Negative for chest pain.  Gastrointestinal: Negative for nausea, vomiting and abdominal pain.  Neurological: Positive for weakness and numbness.      Allergies  Clindamycin; Enalapril maleate; and Lisinopril  Home Medications   Prior to Admission medications   Medication Sig Start Date End Date Taking? Authorizing Provider  amLODipine (NORVASC) 5 MG tablet Take 1 tablet (5 mg total) by mouth daily. 04/22/14  Yes Dolores Patty, MD  aspirin 325 MG tablet Take 325 mg by mouth daily.   Yes Historical Provider, MD  carbamazepine (TEGRETOL) 200 MG tablet Take 200 mg by mouth 2 (two) times daily.   Yes Historical Provider, MD  cloNIDine (CATAPRES) 0.2 MG tablet Take 0.2 mg by mouth 2 (two) times daily.   Yes Historical Provider, MD  cyclobenzaprine (FLEXERIL) 10 MG tablet Take 10 mg by mouth 3 (three) times daily as needed for muscle spasms.   Yes Historical Provider, MD  diclofenac sodium (VOLTAREN) 1 % GEL Apply 2 g topically 4 (four) times daily as needed (pain).   Yes Historical Provider, MD  digoxin (LANOXIN) 0.125 MG tablet Take 0.125 mg by mouth daily.   Yes Historical Provider, MD  diphenhydrAMINE-zinc acetate (BENADRYL) cream Apply topically 3 (three) times daily as needed for itching. 03/25/14  Yes Arturo Morton, MD  furosemide (LASIX) 40 MG tablet Take 40 mg by mouth.   Yes Historical Provider, MD  hydrALAZINE (APRESOLINE) 100 MG tablet Take 100 mg by mouth 3 (three) times daily.   Yes Historical Provider, MD  ibuprofen (ADVIL,MOTRIN) 200 MG tablet Take 400 mg by mouth 3 (three) times daily as needed for mild pain.   Yes Historical Provider, MD  insulin  glargine (LANTUS) 100 UNIT/ML injection Inject 10-30 Units into the skin 2 (two) times daily. Take 10 units in the morning, and 35 units at bedtime. 03/25/14  Yes Arturo Morton, MD  metFORMIN (GLUCOPHAGE) 1000 MG tablet Take 1 tablet (1,000 mg total) by mouth daily with breakfast. 04/12/14 04/12/15 Yes Courtney Paris, MD  metoprolol succinate (TOPROL-XL) 25 MG 24 hr tablet Take 50 mg (2 tablets) in the morning and 25 mg (1 tablet) in the evening. 05/08/14  Yes Aundria Rud, NP  oxyCODONE-acetaminophen (PERCOCET) 10-325 MG per tablet Take 1 tablet by mouth every 8 (eight) hours as needed for pain. 04/26/14  Yes Jacalyn Lefevre, NP  pantoprazole (PROTONIX) 40 MG tablet Take 40 mg by mouth daily.  Yes Historical Provider, MD  simvastatin (ZOCOR) 40 MG tablet Take 40 mg by mouth daily.   Yes Historical Provider, MD  tamsulosin (FLOMAX) 0.4 MG CAPS capsule Take 0.4 mg by mouth daily after supper.   Yes Historical Provider, MD  tiZANidine (ZANAFLEX) 2 MG tablet Take 2 mg by mouth 2 (two) times daily.   Yes Historical Provider, MD   BP 154/78  Pulse 85  Temp(Src) 98.1 F (36.7 C) (Oral)  Resp 18  SpO2 98%  LMP 04/26/2014 Physical Exam  Nursing note and vitals reviewed. Constitutional: She is oriented to person, place, and time. She appears well-developed and well-nourished.  Non-toxic appearance. She does not have a sickly appearance. She does not appear ill. No distress.  HENT:  Head: Normocephalic and atraumatic.  Eyes: EOM are normal. Pupils are equal, round, and reactive to light. Right eye exhibits no discharge. Left eye exhibits no discharge. No scleral icterus.  Neck: Neck supple.  Cardiovascular: Normal rate, regular rhythm and normal heart sounds.   No murmur heard. Pulses:      Radial pulses are 2+ on the right side, and 2+ on the left side.  Split S2. The patient was not tachycardiac on exam.  Pulmonary/Chest: Effort normal and breath sounds normal. She has no wheezes.  Abdominal: Soft.  Bowel sounds are normal. There is no tenderness. There is no rebound and no guarding.  Musculoskeletal: Normal range of motion. She exhibits no edema.  Swelling noted to left upper extremity. No erythema or obvious skin lesion. Decrease in sensation to light touch throughout extremity. No movement in hand elbow or shoulder.  Neurological: She is alert and oriented to person, place, and time.  Skin: Skin is warm and dry. No rash noted. She is not diaphoretic.  Psychiatric: She has a normal mood and affect. Her behavior is normal.    ED Course  Procedures (including critical care time) Labs Review Labs Reviewed - No data to display  Imaging Review No results found.   EKG Interpretation None      MDM   Final diagnoses:  Arm pain, left  History of CVA (cerebrovascular accident)   Vision presents with left arm. Seizures. Multiple visits to the emergency room in the previous months for similar complaints.  No signs of infection on exam. Oral given in ED, will discharge with Neurontin and followup with PCP and neurologist as outpatient. Discussed lab results, imaging results, and treatment plan with the patient. Return precautions given. Reports understanding and no other concerns at this time.  Patient is stable for discharge at this time.  Meds given in ED:  Medications  ketorolac (TORADOL) 15 MG/ML injection 15 mg (15 mg Intramuscular Given 05/11/14 0639)    New Prescriptions   GABAPENTIN (NEURONTIN) 300 MG CAPSULE    Take 1 capsule (300 mg total) by mouth 3 (three) times daily. Day 1: 300 mg, Day 2: 300 mg twice daily, Day 3: 300 mg 3 times daily        Clabe Seal, PA-C 05/11/14 [phone]

## 2014-05-11 NOTE — ED Provider Notes (Signed)
Medical screening examination/treatment/procedure(s) were performed by non-physician practitioner and as supervising physician I was immediately available for consultation/collaboration.   EKG Interpretation None       Lakeyia Surber M Drianna Chandran, MD 05/11/14 1953 

## 2014-05-11 NOTE — ED Notes (Addendum)
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 Hand is swollen, pt states since stroke that is normal but today the hand is more swollen than usual. Denies injury. +2 radial pulse.

## 2014-05-14 ENCOUNTER — Telehealth: Payer: Self-pay

## 2014-05-14 ENCOUNTER — Encounter: Payer: Self-pay | Admitting: Internal Medicine

## 2014-05-14 ENCOUNTER — Ambulatory Visit (INDEPENDENT_AMBULATORY_CARE_PROVIDER_SITE_OTHER): Payer: Medicaid Other | Admitting: Internal Medicine

## 2014-05-14 VITALS — BP 179/116 | HR 134 | Temp 97.2°F | Ht 69.0 in | Wt 262.0 lb

## 2014-05-14 DIAGNOSIS — I10 Essential (primary) hypertension: Secondary | ICD-10-CM

## 2014-05-14 DIAGNOSIS — I6350 Cerebral infarction due to unspecified occlusion or stenosis of unspecified cerebral artery: Secondary | ICD-10-CM

## 2014-05-14 DIAGNOSIS — E1140 Type 2 diabetes mellitus with diabetic neuropathy, unspecified: Secondary | ICD-10-CM

## 2014-05-14 DIAGNOSIS — G8110 Spastic hemiplegia affecting unspecified side: Secondary | ICD-10-CM

## 2014-05-14 DIAGNOSIS — I639 Cerebral infarction, unspecified: Secondary | ICD-10-CM

## 2014-05-14 DIAGNOSIS — E1149 Type 2 diabetes mellitus with other diabetic neurological complication: Secondary | ICD-10-CM

## 2014-05-14 DIAGNOSIS — G8114 Spastic hemiplegia affecting left nondominant side: Secondary | ICD-10-CM

## 2014-05-14 DIAGNOSIS — L259 Unspecified contact dermatitis, unspecified cause: Secondary | ICD-10-CM

## 2014-05-14 DIAGNOSIS — IMO0002 Reserved for concepts with insufficient information to code with codable children: Secondary | ICD-10-CM

## 2014-05-14 DIAGNOSIS — E1165 Type 2 diabetes mellitus with hyperglycemia: Principal | ICD-10-CM

## 2014-05-14 DIAGNOSIS — E1142 Type 2 diabetes mellitus with diabetic polyneuropathy: Secondary | ICD-10-CM

## 2014-05-14 LAB — GLUCOSE, CAPILLARY: Glucose-Capillary: 124 mg/dL — ABNORMAL HIGH (ref 70–99)

## 2014-05-14 MED ORDER — BLOOD PRESSURE KIT: 1.0000 | PACK | Freq: Every day | Status: DC

## 2014-05-14 NOTE — Progress Notes (Signed)
Subjective:   Patient ID: [name] female   DOB: 12-04-1966 49 y.o.   MRN: [id number]  HPI: [name] is a 48 y.o. female w/ PMHx of HTN, HLD, uncontrolled DM type II, OSA, CAD, CHF, and h/o CVA w/ left-sided residual weakness, presents to the clinic today for a follow-up visit. Patient was last seen in the clinic on 04/12/14 for DM follow up, HbA1c found to be 12.1 at that time. Prior to that, her Lantus regimen was increaed to 10 units in the AM, 35 units in the PM and Metformin 1000 mg po qd was added to her regimen on 5/1.  Patient was seen by Sports Medicine on 04/26/14, given an arm sling and a cane at that time. Today, patient walked into the office and appeared very stable in ambulation w/ a cane.  [name] is in good spirits during her clinic visit as she is feeling well since her last visit. She claims her blood sugar has been very well controlled on her new Insulin regimen w/ range b/w 80-150 at home. Today during visit, CBG was 124. Also claims that her mobility has improved significantly w/ the help of PT and sports medicine. BP was significantly elevated today however, also w/ tachycardia, most likely 2/2 ambulation in the setting of deconditioning. BP was repeated at the end of her clinic visit and found to be 160/100. [name] has also seen CHF clinic on 05/08/14, increased Toprol XL to 50 mg in AM, 25 mg in PM.  Most recently, patient was seen in ED on 5/20 for left hand pain, an ongoing issue for her. Patient was sent home w/ Rx for Neurontin 300 mg tid and she claims this has helped her pain significantly. Patient still w/ left hand swelling, however, still appears to be 2/2 dependent edema. No erythema, increased temperature, or signs of infection.   Past Medical History  Diagnosis Date  . Hypertension     Poorly controlled. Has had HTN since age 43. Angioedema with ACEI.  24 Hr urine and renal arterial dopplers ordered . . . Never done  . Type II or unspecified  type diabetes mellitus without mention of complication, uncontrolled DX: 2002  . NICM (nonischemic cardiomyopathy)     EF 45-50% in 8/12, cath 6/12 showed normal coronaries, EF 50-55% by LV gram  . Morbid obesity   . Allergic rhinitis   . HLD (hyperlipidemia)   . OSA on CPAP     sleep study in 8/12 showed moderate to severe OSA requiring CPAP  . Chronic lower back pain     secondary to DJD, obsetiy, hip problems. Followed by Dr. Oval Linsey (pain management)  . 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 Chronic diastolic heart failure      Primarily diastolic CHF: Likely due to uncontrolled HTN. Last echo (8/12) with EF 45-50%, mild to moderate LVH with some asymmetric septal hypertrophy, RV normal size and systolic function. EF 50-55% by LV-gram in 6/12.   . Degeneration of lumbar or lumbosacral intervertebral disc   . Polyneuropathy in diabetes(357.2)   . Thoracic or lumbosacral neuritis or radiculitis, unspecified   . Calcifying tendinitis of shoulder   . LBBB (left bundle branch block)   . Chronic combined systolic and diastolic CHF (congestive heart failure)     EF 40-45% by echo 12/06/2012  . Coronary artery disease     questionable. LHC 05/2011 showing normal coronaries //  Followed at [facility], Dr. Aundra Dubin  . GERD (gastroesophageal reflux disease)    Current Outpatient Prescriptions  Medication Sig Dispense Refill  . amLODipine (NORVASC) 5 MG tablet Take 1 tablet (5 mg total) by mouth daily.  30 tablet  3  . aspirin 325 MG tablet Take 325 mg by mouth daily.      . Blood Pressure KIT 1 each by Does not apply route daily.  1 each  0  . carbamazepine (TEGRETOL) 200 MG tablet Take 200 mg by mouth 2 (two) times daily.      . cloNIDine (CATAPRES) 0.2 MG tablet Take 0.2 mg by mouth 2 (two) times daily.      . cyclobenzaprine (FLEXERIL) 10 MG tablet Take 10 mg by mouth 3 (three) times daily as needed for muscle spasms.      . diclofenac sodium (VOLTAREN) 1 %  GEL Apply 2 g topically 4 (four) times daily as needed (pain).      [name] digoxin (LANOXIN) 0.125 MG tablet Take 0.125 mg by mouth daily.      . diphenhydrAMINE-zinc acetate (BENADRYL) cream Apply topically 3 (three) times daily as needed for itching.  28.3 g  0  . furosemide (LASIX) 40 MG tablet Take 40 mg by mouth.      . gabapentin (NEURONTIN) 300 MG capsule Take 1 capsule (300 mg total) by mouth 3 (three) times daily. Day 1: 300 mg, Day 2: 300 mg twice daily, Day 3: 300 mg 3 times daily  60 capsule  0  . hydrALAZINE (APRESOLINE) 100 MG tablet Take 100 mg by mouth 3 (three) times daily.      [name] ibuprofen (ADVIL,MOTRIN) 200 MG tablet Take 400 mg by mouth 3 (three) times daily as needed for mild pain.      [name] insulin glargine (LANTUS) 100 UNIT/ML injection Inject 10-30 Units into the skin 2 (two) times daily. Take 10 units in the morning, and 35 units at bedtime.      . metFORMIN (GLUCOPHAGE) 1000 MG tablet Take 1 tablet (1,000 mg total) by mouth daily with breakfast.  30 tablet  11  . metoprolol succinate (TOPROL-XL) 25 MG 24 hr tablet Take 50 mg (2 tablets) in the morning and 25 mg (1 tablet) in the evening.  90 tablet  3  . oxyCODONE-acetaminophen (PERCOCET) 10-325 MG per tablet Take 1 tablet by mouth every 8 (eight) hours as needed for pain.  90 tablet  0  . pantoprazole (PROTONIX) 40 MG tablet Take 40 mg by mouth daily.      . simvastatin (ZOCOR) 40 MG tablet Take 40 mg by mouth daily.      . tamsulosin (FLOMAX) 0.4 MG CAPS capsule Take 0.4 mg by mouth daily after supper.      [name] tiZANidine (ZANAFLEX) 2 MG tablet Take 2 mg by mouth 2 (two) times daily.       No current facility-administered medications for this visit.   Family History  Problem Relation Age of Onset  . Heart disease Mother 53    Died of MI at age 10 yo  . Heart disease Paternal Grandmother     requiring pacemaker.  [name] Heart disease Paternal Grandfather 4    Died of MI at possibly age 40-53yo  . Stroke Paternal Grandfather     . Heart disease Father 2    MI age 69yo requiring stenting  . Kidney disease Mother     requiring dialysis  . Congestive Heart Failure Mother   .  Diabetes Father   . Diabetes Brother   . Heart disease Brother 46    MI at age 50 years old  . Breast cancer Paternal Aunt   . Breast cancer Maternal Grandmother    History   Social History  . Marital Status: Single    Spouse Name: N/A    Number of Children: 2  . Years of Education: 9th grade   Occupational History  . Unemployed     planning on getting disability   Social History Main Topics  . Smoking status: Never Smoker   . Smokeless tobacco: Never Used  . Alcohol Use: No  . Drug Use: No  . Sexual Activity: Yes    Birth Control/ Protection: Surgical   Other Topics Concern  . None   Social History Narrative   Lives in [place] with her son. Is able to read and write fluently in [place].    Review of Systems: General: Denies fever, chills, diaphoresis, appetite change and fatigue.  Respiratory: Denies SOB, DOE, cough, chest tightness, and wheezing.   Cardiovascular: Denies chest pain and palpitations.  Gastrointestinal: Denies nausea, vomiting, abdominal pain, diarrhea, constipation, blood in stool and abdominal distention.  Genitourinary: Denies dysuria, urgency, frequency, hematuria, and flank pain. Endocrine: Denies hot or cold intolerance, polyuria, and polydipsia. Musculoskeletal: Positive for gait problem (improving) and left hand swelling. Denies myalgias, back pain, joint swelling, arthralgias..  Skin: Positive for left axillary hyperpigmentation. Denies pallor, rash and wounds.  Neurological: Positive for left-sided residual weakness (improving), LUE weakness>LLE weakness. Denies dizziness, seizures, syncope, weakness, lightheadedness, numbness and headaches.  Psychiatric/Behavioral: Denies mood changes, confusion, nervousness, sleep disturbance and agitation.  Objective:   Physical Exam: Filed Vitals:    05/14/14 1505  BP: 179/116  Pulse: 134  Temp: 97.2 F (36.2 C)  TempSrc: Oral  Height: _0  (1.753 m)  Weight: 262 lb (118.842 kg)   General: Vital signs reviewed.  Patient is an obese female, in no acute distress and cooperative with exam.  Head: Normocephalic and atraumatic. Left-sided lower facial droop.  Eyes: PERRL, EOMI, conjunctivae normal, No scleral icterus.  Neck: Supple, trachea midline, normal ROM, No JVD, masses, thyromegaly, or carotid bruit present.  Cardiovascular: Tachycardic, regular rhythm, S1 normal, S2 normal, no murmurs, gallops, or rubs. Pulmonary/Chest: Air entry equal bilaterally, no wheezes, rales, or rhonchi. Abdominal: Soft, non-tender, non-distended, BS +, no masses, organomegaly, or guarding present.  Musculoskeletal: Left arm weakness and hand swelling. No pain w/ passive ROM. No joint deformities, erythema, or stiffness. Extremities: Left hand pitting edema. Also w/ +1 LE edema. Pulses symmetric and intact bilaterally. No cyanosis or clubbing. Neurological: A&O x3. Left-sided facial droop and weakness. Otherwise, CN's intact. 1/5 strength in LUE, 4/5 in LLE. Otherwise strength intact. Sensory abnormalities noted over the LUE.  Skin: Warm, dry and intact. No rashes or erythema. Hyperpigmentation in the left axilla.  Psychiatric: Normal mood and affect. speech and behavior is normal. Cognition and memory are normal.   Assessment & Plan:   Please see problem based assessment and plan.

## 2014-05-14 NOTE — Patient Instructions (Signed)
General Instructions:  1. Please schedule a follow up appointment for 2-3 months.  2. Please take all medications as prescribed. PLEASE CHECK BP AT HOME. BRING LOG TO NEXT APPOINTMENT.  3. If you have worsening of your symptoms or new symptoms arise, please call the clinic ([phone]), or go to the ER immediately if symptoms are severe.  You have done a great job in taking all your medications. I appreciate it very much. Please continue doing that.   Please bring your medicines with you each time you come to clinic.  Medicines may include prescription medications, over-the-counter medications, herbal remedies, eye drops, vitamins, or other pills.   Progress Toward Treatment Goals:  Treatment Goal 05/14/2014  Hemoglobin A1C improved  Blood pressure deteriorated  Prevent falls improved    Self Care Goals & Plans:  Self Care Goal 05/14/2014  Manage my medications take my medicines as prescribed  Monitor my health keep track of my blood glucose; bring my glucose meter and log to each visit; keep track of my blood pressure; bring my blood pressure log to each visit  Eat healthy foods -  Be physically active find an activity I enjoy; take a walk every day  Meeting treatment goals maintain the current self-care plan    Home Blood Glucose Monitoring 05/14/2014  Check my blood sugar 2 times a day  When to check my blood sugar N/A     Care Management & Community Referrals:  Referral 05/14/2014  Referrals made for care management support none needed

## 2014-05-14 NOTE — Telephone Encounter (Signed)
Patient called complaining of her arms hurting.  She would like to know what to do?

## 2014-05-15 NOTE — Telephone Encounter (Signed)
What exactly is she referring to? Shoulders, entire arms?  Is it aching or is it numb/pins and needles in quality? Likely nerve related. Could consider increasing gabpentin if it is neuropathic in nature.

## 2014-05-15 NOTE — Assessment & Plan Note (Signed)
Lab Results  Component Value Date   HGBA1C 12.1 03/25/2014   HGBA1C 10.7 12/19/2013   HGBA1C 9.2 07/24/2013     Assessment: Diabetes control:  Improved CBG's recently, blood sugar 124 in clinic today. Blood sugar range from 80-150 at home, according to the patient.  Progress toward A1C goal:  improved Comments: Currently on Lantus 10 units in AM, 35 units in PM + Metformin 1000 mg qd at this time  Plan: Medications:  continue current medications Home glucose monitoring: Frequency: 2 times a day Timing: N/A Instruction/counseling given: reminded to bring blood glucose meter & log to each visit Educational resources provided:   Self management tools provided:   Other plans: Will repeat HbA1c in August; can consider increasing Metformin to 1000 mg bid at that time. Given significant improvement in blood sugars since last visit, will not make any changes at this time.

## 2014-05-15 NOTE — Assessment & Plan Note (Signed)
Patient w/ continued left upper and lower extremity weakness, however, LE significantly improved w/ PT since last visit. Still feel strongly that patient should not receive an electric wheelchair and feel that she is ambulating much better than before w/ the assistance of a cane.  -Continue PT and sports medicine follow up -OT referral for UE weakness.

## 2014-05-15 NOTE — Assessment & Plan Note (Signed)
Follows w/ Sports Medicine for this, currently taking Flexeril + Zanaflax for spasm. Has been discussing Botox injection w/ Dr. Riley Kill. -OT referral sent for UE weakness.

## 2014-05-15 NOTE — Telephone Encounter (Signed)
Contacted patient to get more information regarding her "arm" pain. Patient states it is her left hand, forearm, and shoulder that is bothering her. Patient states it feels like pins and needles. She also states that she is having left leg spasms really bad even though she is taking the medication everyday.

## 2014-05-15 NOTE — Assessment & Plan Note (Addendum)
Patient w/ contact dermatitis on LUE 2/2 arm sling for which she was seen in urgent care on 04/23/14. Has improved significantly, almost resolved at this time.

## 2014-05-15 NOTE — Assessment & Plan Note (Signed)
BP Readings from Last 3 Encounters:  05/14/14 179/116  05/11/14 148/81  05/08/14 170/88    Lab Results  Component Value Date   NA 139 05/08/2014   K 4.0 05/08/2014   CREATININE 0.67 05/08/2014    Assessment: Blood pressure control: moderately elevated Progress toward BP goal:  deteriorated Comments: Initial BP 179/116 w/ tachycardia, most likely 2/2 ambulation in the setting of deconditioning. Repeat BP at end of clinic visit improved, 160/100.   Plan: Medications:  continue current medications; Toprol-XL (recently increased to 50 mg in AM, 25 mg in PM) + Hydralazine 100 mg tid + Amlodipine 5 mg qd + Clonidine 0.2 mg bid.  Educational resources provided:   Self management tools provided: home blood pressure logbook Other plans: Gave patient BP logbook to keep track of blood pressure at home and ordered BP kit for patient to use at home. [name] will keep track of some BP readings to bring to next clinic visit to assess if changes need to be made to medications. Suspect BP is better controlled than seen during this visit as patient has just started to ambulate more and appears to be deconditioned 2/2 to this. Suspect it will improve over time.

## 2014-05-16 NOTE — Progress Notes (Signed)
Case discussed with Dr. Jones at the time of the visit.  We reviewed the resident's history and exam and pertinent patient test results.  I agree with the assessment, diagnosis, and plan of care documented in the resident's note. 

## 2014-05-16 NOTE — Telephone Encounter (Signed)
Contacted patient to inform her to increase Gabapentin to 300 mg BID and 600 mg QHS per Dr. Riley Kill. Patient verbalized understanding.

## 2014-05-16 NOTE — Telephone Encounter (Signed)
Increase gabapentin to 300mg  bid and 600mg  qhs

## 2014-05-17 ENCOUNTER — Encounter: Payer: Self-pay | Admitting: Cardiology

## 2014-05-20 ENCOUNTER — Ambulatory Visit: Payer: Medicaid Other | Attending: Internal Medicine | Admitting: Occupational Therapy

## 2014-05-20 DIAGNOSIS — R279 Unspecified lack of coordination: Secondary | ICD-10-CM | POA: Insufficient documentation

## 2014-05-20 DIAGNOSIS — IMO0001 Reserved for inherently not codable concepts without codable children: Secondary | ICD-10-CM | POA: Insufficient documentation

## 2014-05-20 DIAGNOSIS — Z8673 Personal history of transient ischemic attack (TIA), and cerebral infarction without residual deficits: Secondary | ICD-10-CM | POA: Insufficient documentation

## 2014-05-21 ENCOUNTER — Ambulatory Visit: Payer: Medicaid Other | Admitting: Occupational Therapy

## 2014-05-22 ENCOUNTER — Encounter: Payer: Self-pay | Admitting: Physical Medicine & Rehabilitation

## 2014-05-22 ENCOUNTER — Encounter: Payer: Medicaid Other | Attending: Physical Medicine & Rehabilitation | Admitting: Physical Medicine & Rehabilitation

## 2014-05-22 ENCOUNTER — Encounter: Payer: Self-pay | Admitting: Internal Medicine

## 2014-05-22 ENCOUNTER — Other Ambulatory Visit: Payer: Self-pay

## 2014-05-22 VITALS — BP 186/102 | HR 98 | Resp 14 | Ht 69.0 in | Wt 262.0 lb

## 2014-05-22 DIAGNOSIS — E1165 Type 2 diabetes mellitus with hyperglycemia: Principal | ICD-10-CM

## 2014-05-22 DIAGNOSIS — E1140 Type 2 diabetes mellitus with diabetic neuropathy, unspecified: Secondary | ICD-10-CM

## 2014-05-22 DIAGNOSIS — I10 Essential (primary) hypertension: Secondary | ICD-10-CM

## 2014-05-22 DIAGNOSIS — IMO0002 Reserved for concepts with insufficient information to code with codable children: Secondary | ICD-10-CM

## 2014-05-22 DIAGNOSIS — I6350 Cerebral infarction due to unspecified occlusion or stenosis of unspecified cerebral artery: Secondary | ICD-10-CM

## 2014-05-22 DIAGNOSIS — E1149 Type 2 diabetes mellitus with other diabetic neurological complication: Secondary | ICD-10-CM

## 2014-05-22 DIAGNOSIS — E1142 Type 2 diabetes mellitus with diabetic polyneuropathy: Secondary | ICD-10-CM

## 2014-05-22 DIAGNOSIS — Z79899 Other long term (current) drug therapy: Secondary | ICD-10-CM | POA: Insufficient documentation

## 2014-05-22 DIAGNOSIS — I639 Cerebral infarction, unspecified: Secondary | ICD-10-CM

## 2014-05-22 DIAGNOSIS — Z5181 Encounter for therapeutic drug level monitoring: Secondary | ICD-10-CM | POA: Insufficient documentation

## 2014-05-22 DIAGNOSIS — G8114 Spastic hemiplegia affecting left nondominant side: Secondary | ICD-10-CM

## 2014-05-22 DIAGNOSIS — G8110 Spastic hemiplegia affecting unspecified side: Secondary | ICD-10-CM

## 2014-05-22 DIAGNOSIS — M1710 Unilateral primary osteoarthritis, unspecified knee: Secondary | ICD-10-CM | POA: Insufficient documentation

## 2014-05-22 MED ORDER — ASPIRIN 325 MG PO TABS: 325.0000 mg | ORAL_TABLET | Freq: Every day | ORAL | Status: DC

## 2014-05-22 MED ORDER — DICLOFENAC SODIUM 1 % TD GEL: 2.0000 g | Freq: Four times a day (QID) | TRANSDERMAL | Status: DC | PRN

## 2014-05-22 MED ORDER — OXYCODONE-ACETAMINOPHEN 10-325 MG PO TABS: 1.0000 | ORAL_TABLET | Freq: Three times a day (TID) | ORAL | Status: DC | PRN

## 2014-05-22 MED ORDER — BACLOFEN 10 MG PO TABS: 5.0000 mg | ORAL_TABLET | Freq: Three times a day (TID) | ORAL | Status: DC

## 2014-05-22 NOTE — Progress Notes (Signed)
Subjective:    Patient ID: [name], female    DOB: 1965/12/14, 48 y.o.   MRN: [id number]  HPI  [name] is back regarding her CVA and chronic pain. Overall things are going pretty well at home although her kids "are getting on her nerves!"  She is having more spasms in her leg and arm. The zanaflex hasn't helped a whole lot. The gabapentin has helped a little bit with her nerve related pain but it hasn't done much for her spasms. She is trying to stretch most days and walks frequently. She is having difficulties at time with the velcor on her AFO and may need some new straps   Pain Inventory Average Pain 7 Pain Right Now 5 My pain is intermittent, constant and aching  In the last 24 hours, has pain interfered with the following? General activity 7 Relation with others 7 Enjoyment of life 7 What TIME of day is your pain at its worst? varies Sleep (in general) Poor  Pain is worse with: walking and laying down Pain improves with: medication Relief from Meds: 10  Mobility walk with assistance use a cane ability to climb steps?  yes do you drive?  yes transfers alone  Function disabled: date disabled na I need assistance with the following:  meal prep, household duties and shopping  Neuro/Psych numbness trouble walking spasms  Prior Studies Any changes since last visit?  no  Physicians involved in your care Any changes since last visit?  no   Family History  Problem Relation Age of Onset  . Heart disease Mother 75    Died of MI at age 94 yo  . Heart disease Paternal Grandmother     requiring pacemaker.  [name] Heart disease Paternal Grandfather 18    Died of MI at possibly age 30-53yo  . Stroke Paternal Grandfather   . Heart disease Father 73    MI age 50yo requiring stenting  . Kidney disease Mother     requiring dialysis  . Congestive Heart Failure Mother   . Diabetes Father   . Diabetes Brother   . Heart disease Brother 5    MI at age 73 years old   . Breast cancer Paternal Aunt   . Breast cancer Maternal Grandmother    History   Social History  . Marital Status: Single    Spouse Name: N/A    Number of Children: 2  . Years of Education: 9th grade   Occupational History  . Unemployed     planning on getting disability   Social History Main Topics  . Smoking status: Never Smoker   . Smokeless tobacco: Never Used  . Alcohol Use: No  . Drug Use: No  . Sexual Activity: Yes    Birth Control/ Protection: Surgical   Other Topics Concern  . None   Social History Narrative   Lives in [place] with her son. Is able to read and write fluently in [place].   Past Surgical History  Procedure Laterality Date  . Breast biopsies Bilateral 2011    patient reports benign results  . Tubal ligation  05/31/1985  . Hand surgery Left   . Shoulder surgery Right   . Dental surgery      tumors removed   . Cardiac catheterization  05/2011  . Pacemaker/defibrillator placed  08/22/13 Left 08/22/13  . Tee without cardioversion N/A 01/14/2014    Procedure: TRANSESOPHAGEAL ECHOCARDIOGRAM (TEE);  Surgeon: Thurmon Fair, MD;  Location: [facility] ENDOSCOPY;  Service:  Cardiovascular;  Laterality: N/A;   Past Medical History  Diagnosis Date  . Hypertension     Poorly controlled. Has had HTN since age 78. Angioedema with ACEI.  24 Hr urine and renal arterial dopplers ordered . . . Never done  . Type II or unspecified type diabetes mellitus without mention of complication, uncontrolled DX: 2002  . NICM (nonischemic cardiomyopathy)     EF 45-50% in 8/12, cath 6/12 showed normal coronaries, EF 50-55% by LV gram  . Morbid obesity   . Allergic rhinitis   . HLD (hyperlipidemia)   . OSA on CPAP     sleep study in 8/12 showed moderate to severe OSA requiring CPAP  . Chronic lower back pain     secondary to DJD, obsetiy, hip problems. Followed by Dr. Ivory Broad (pain management)  . 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 Chronic diastolic  heart failure      Primarily diastolic CHF: Likely due to uncontrolled HTN. Last echo (8/12) with EF 45-50%, mild to moderate LVH with some asymmetric septal hypertrophy, RV normal size and systolic function. EF 50-55% by LV-gram in 6/12.   . Degeneration of lumbar or lumbosacral intervertebral disc   . Polyneuropathy in diabetes(357.2)   . Thoracic or lumbosacral neuritis or radiculitis, unspecified   . Calcifying tendinitis of shoulder   . LBBB (left bundle branch block)   . Chronic combined systolic and diastolic CHF (congestive heart failure)     EF 40-45% by echo 12/06/2012  . Coronary artery disease     questionable. LHC 05/2011 showing normal coronaries // Followed at [facility], Dr. Shirlee Latch  . GERD (gastroesophageal reflux disease)    BP 186/102  Pulse 98  Resp 14  Ht 5\' 9"  (1.753 m)  Wt 262 lb (118.842 kg)  BMI 38.67 kg/m2  SpO2 96%  LMP 04/26/2014  Opioid Risk Score:   Fall Risk Score: High Fall Risk (>13 points) (pt educated on fall risk, brochure given to pt previously)   Review of Systems  Musculoskeletal: Positive for gait problem.  Neurological: Positive for numbness.       Spasms  All other systems reviewed and are negative.      Objective:   Physical Exam  Constitutional: She is oriented to person, place, and time. She appears well-developed and well-nourished.  Obese --no change  HENT:  Head: Normocephalic and atraumatic.  Nose: Nose normal.  Eyes: Conjunctivae and EOM are normal. Pupils are equal, round, and reactive to light.  Neck: Normal range of motion.  Cardiovascular: Normal rate and regular rhythm. Defib/pacer left upper chest wall, site clean  Pulmonary/Chest: Effort normal and breath sounds normal.  Abdominal: She exhibits no distension. There is no tenderness.  Musculoskeletal: Patient had pain with cervical movements particularly rotation and bending to the left. Left shoulder is painful with RTC maneuvers, apprehension test is  negative. Pacer site A little tender. Lumbar back: She exhibits decreased range of motion, tenderness, bony tenderness, pain and spasm She was unable to flex to 40 degrees and extend past 10 degrees. She struggled to stand after sitting. 1+ edema left lower ext. And trace to 1+ LUE.  Equinovarus deformity left foot.   Mild joint line tenderness bilaterally. She has bilateral valgus deformities at each knee, right more than left.  Both calves are tender with stretching and slightly with palpation.    Neurological: She is alert and oriented to person, place, and time. Left upper trace  at bicep and deltoid. 0/5 elsewhere in UE and LLE. Tone 2/4 bicep trace to 0 at pec, 1-2 gastroc, TP, tib anterior trace.  DTR's 3+ on left. Left central 7 and tongue deviation. Speech minimally dysarthric. Intelligence normal. sensory deficit (stocking glove both legs) is present.    Psychiatric: She has a normal mood and affect. Her behavior is normal. Judgment and thought content normal.   Assessment & Plan:   ASSESSMENT:  1. Chronic low back pain which is multifactorial. Recent MRI of the  back is notable for disc disease and facet hypertrophy. Most  involved level appeared to be L4-L5, where there is a shallow broad-  based disk herniation and facet arthropathy with mild stenosis.  2. Diabetic peripheral neuropathy.  3. Bilateral rotator cuff syndrome.  4. 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 5. Cardiomyopathy/HTN. It is quite obvious that she's feeling better given her weight loss and pacer.  6. 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  7. Morbid obesity    PLAN:  1. Continuie tegretol at 200mg  bid and gabapentin 300-300-600mg   for neuropathic pain.  2. Percocet 10/325 was refilled one q8 prn. #90.  3. Continue rehab efforts. She is making progress.  4. Dc tizanidine and begin trial of baclofen 5mg  tid. Increase to 10mg  tid if needed/tolerated.  Will set up with botox 400u to left bicep, wrist/finger flexors, gastroc, tib-pos 5. Cardiac mgt per Dr. 05-16-1982, et al.  6. Continue Voltaren gel for knees.  7. I will see her back in about 1 months' time or as soon as I can get her in.   30 minutes of face to face patient care time were spent during this visit. All questions were encouraged and answered.            Assessment & Plan:

## 2014-05-22 NOTE — Patient Instructions (Signed)
BACLOFEN (SPASM MEDICATION)  Take 1/2 tablet of baclofen for one week. If tolerated and needed increase to one full tablet  Stop tizanidine

## 2014-05-23 ENCOUNTER — Other Ambulatory Visit: Payer: Self-pay | Admitting: *Deleted

## 2014-05-23 MED ORDER — INSULIN GLARGINE 100 UNIT/ML ~~LOC~~ SOLN: 10.0000 [IU] | Freq: Two times a day (BID) | SUBCUTANEOUS | Status: DC

## 2014-05-24 ENCOUNTER — Telehealth: Payer: Self-pay | Admitting: *Deleted

## 2014-05-24 NOTE — Telephone Encounter (Signed)
Call from [facility] - states pt has been on Lantus Solostar and was it ok to change Lantus vials to pens - verbal order given.  Also states pt has been on 13 untis am /30 units pm. I told her current dose is 10 units am/35 units pm as prescribed per Dr Yetta Barre.

## 2014-05-24 NOTE — Telephone Encounter (Signed)
Continue Lantus 10 units in AM, 35 units in PM. Thanks!  Signed: Lars Masson, MD 05/24/2014 3:33 PM

## 2014-05-27 ENCOUNTER — Ambulatory Visit: Payer: Medicaid Other | Admitting: Physical Medicine & Rehabilitation

## 2014-05-28 ENCOUNTER — Telehealth: Payer: Self-pay | Admitting: *Deleted

## 2014-05-28 ENCOUNTER — Other Ambulatory Visit: Payer: Self-pay | Admitting: Internal Medicine

## 2014-05-28 DIAGNOSIS — I639 Cerebral infarction, unspecified: Secondary | ICD-10-CM

## 2014-05-28 NOTE — Telephone Encounter (Signed)
Order placed. Thanks.   Signed: Lars Masson, MD 05/28/2014 4:11 PM

## 2014-05-28 NOTE — Telephone Encounter (Signed)
DME order given to C. Boone. 

## 2014-05-28 NOTE — Telephone Encounter (Signed)
Pt called clinic and left message on triage recording - needs Rx for tub bench from [facility]. Stanton Kidney Ditzler RN 05/28/14 11:20AM

## 2014-05-28 NOTE — Telephone Encounter (Signed)
Pt aware [facility] has order for tub bench and will send to [facility].

## 2014-05-30 ENCOUNTER — Ambulatory Visit: Payer: Medicaid Other

## 2014-05-30 DIAGNOSIS — IMO0001 Reserved for inherently not codable concepts without codable children: Secondary | ICD-10-CM | POA: Diagnosis not present

## 2014-06-04 ENCOUNTER — Ambulatory Visit: Payer: Medicaid Other | Admitting: Occupational Therapy

## 2014-06-04 ENCOUNTER — Ambulatory Visit: Payer: Medicaid Other

## 2014-06-06 ENCOUNTER — Telehealth: Payer: Self-pay | Admitting: Dietician

## 2014-06-06 ENCOUNTER — Ambulatory Visit (HOSPITAL_COMMUNITY)
Admission: RE | Admit: 2014-06-06 | Discharge: 2014-06-06 | Disposition: A | Discharge status: Home / Self Care | Payer: Medicaid Other | Source: Ambulatory Visit | Attending: Internal Medicine | Admitting: Internal Medicine

## 2014-06-06 VITALS — BP 136/89 | Wt 268.0 lb

## 2014-06-06 DIAGNOSIS — I5022 Chronic systolic (congestive) heart failure: Secondary | ICD-10-CM | POA: Diagnosis not present

## 2014-06-06 DIAGNOSIS — I5042 Chronic combined systolic (congestive) and diastolic (congestive) heart failure: Secondary | ICD-10-CM

## 2014-06-06 DIAGNOSIS — G4733 Obstructive sleep apnea (adult) (pediatric): Secondary | ICD-10-CM | POA: Diagnosis not present

## 2014-06-06 DIAGNOSIS — I509 Heart failure, unspecified: Secondary | ICD-10-CM | POA: Diagnosis present

## 2014-06-06 DIAGNOSIS — I10 Essential (primary) hypertension: Secondary | ICD-10-CM

## 2014-06-06 NOTE — Telephone Encounter (Signed)
Follow up about testing supplies, evidently patient was having difficulty getting them in past.

## 2014-06-06 NOTE — Patient Instructions (Signed)
Follow up in 2 months  Do the following things EVERYDAY: 1) Weigh yourself in the morning before breakfast. Write it down and keep it in a log. 2) Take your medicines as prescribed 3) Eat low salt foods-Limit salt (sodium) to 2000 mg per day.  4) Stay as active as you can everyday 5) Limit all fluids for the day to less than 2 liters 

## 2014-06-06 NOTE — Progress Notes (Signed)
Patient ID: [name], female   DOB: Mar 31, 1966, 48 y.o.   MRN: [id number]   Weight Range  282 pounds  Baseline proBNP   EP: Dr Graciela Husbands PCP: Dr Yetta Barre or Dr. Lovenia Kim internal medicine  Angioedema with ACE-I  --Optivol--  HPI: 48 yo with history of morbid obesity, HTN, HLD, CAD, DM2, OSA on CPAP, CVA and combined CHF in the setting of poorly-controlled HTN. She was admitted in 05/2011 with chest pain and new LBBB and ended up getting a left heart cath, which showed no angiographic CAD. Renal artery dopplers were normal in 04/2012.   Was hospitalized May 2014 with acute on chronic combined systolic and diastolic CHF. EF from December 2013 was down to 40 to 45%.   Admitted in January 2015 with slurred speech, L arm weakness and facial droop. Initial head CT showed no acute abnormality. Repeat head CT showed subtle gray white matter differentiation in R frontal lobe and R operculum perhaps representing ischemic R sided MCA. STAT repeat CT head later on 1/29 showed further evolution of R MCA territory infarct involving R insular cortex and operculum. No hemorrhage. Neurology recommended CTA head/neck, which showed occlusion of MCA branch to the area of R MCA infarct and a normal neck CTA. TEE was done and did not reveal embolic source. Was discharged to inpatient rehab.   Follow up for HF: Last visit Toprol XL was increased to 50 mg in am and 25 mg in pm. Has little more dizziness after Toprol XL was increased. Denies SOB/PND/Orthopnea. Had a fall last week trying to rush an answer the phone.   Has personal care aide daily. Attending outpatient physical therapy.   ECHO 11/2012: 40-45% ECHO  07/2013: 20% RV normal ECHO    08/23/2013: EF 40-45%, diff HK ECHO   09/05/2013: EF 25-30%, diff HK  ECHO  12/11/13: EF 30%, diff HK, RV normal ECHO 01/14/2014: EF 35-40%, LA mod dilated   Labs 06/2013: proBNP 230, Cr 0.9, K 3.7          08/2013: K+ 3.9, Cr 0.9       09/2013: K 4.2, Cr 0.86, pro-BNP 718, dig  level <0.3     10/07/13: K+ 4.1, Cr 0.83       05/08/14: K 4.0 Creatinine 0.67 Pro BNP 233 Dig level 0.3   ROS: All systems negative except as listed in HPI, PMH and Problem List.  Past Medical History  Diagnosis Date  . Hypertension     Poorly controlled. Has had HTN since age 7. Angioedema with ACEI.  24 Hr urine and renal arterial dopplers ordered . . . Never done  . Type II or unspecified type diabetes mellitus without mention of complication, uncontrolled DX: 2002  . NICM (nonischemic cardiomyopathy)     EF 45-50% in 8/12, cath 6/12 showed normal coronaries, EF 50-55% by LV gram  . Morbid obesity   . Allergic rhinitis   . HLD (hyperlipidemia)   . OSA on CPAP     sleep study in 8/12 showed moderate to severe OSA requiring CPAP  . Chronic lower back pain     secondary to DJD, obsetiy, hip problems. Followed by Dr. Ivory Broad (pain management)  . 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 Chronic diastolic heart failure      Primarily diastolic CHF: Likely due to uncontrolled HTN. Last echo (8/12) with EF 45-50%, mild to moderate LVH with some asymmetric septal hypertrophy, RV normal  size and systolic function. EF 50-55% by LV-gram in 6/12.   . Degeneration of lumbar or lumbosacral intervertebral disc   . Polyneuropathy in diabetes(357.2)   . Thoracic or lumbosacral neuritis or radiculitis, unspecified   . Calcifying tendinitis of shoulder   . LBBB (left bundle branch block)   . Chronic combined systolic and diastolic CHF (congestive heart failure)     EF 40-45% by echo 12/06/2012  . Coronary artery disease     questionable. LHC 05/2011 showing normal coronaries // Followed at [facility], Dr. Aundra Dubin  . GERD (gastroesophageal reflux disease)     Current Outpatient Prescriptions  Medication Sig Dispense Refill  . amLODipine (NORVASC) 5 MG tablet Take 1 tablet (5 mg total) by mouth daily.  30 tablet  3  . aspirin 325 MG tablet Take 1 tablet (325 mg  total) by mouth daily.  30 tablet  5  . baclofen (LIORESAL) 10 MG tablet Take 0.5-1 tablets (5-10 mg total) by mouth 3 (three) times daily.  90 tablet  3  . Blood Pressure KIT 1 each by Does not apply route daily.  1 each  0  . carbamazepine (TEGRETOL) 200 MG tablet Take 200 mg by mouth 2 (two) times daily.      . cloNIDine (CATAPRES) 0.2 MG tablet Take 0.2 mg by mouth 2 (two) times daily.      . cyclobenzaprine (FLEXERIL) 10 MG tablet Take 10 mg by mouth 3 (three) times daily as needed for muscle spasms.      . diclofenac sodium (VOLTAREN) 1 % GEL Apply 2 g topically 4 (four) times daily as needed (pain).  3 Tube  5  . digoxin (LANOXIN) 0.125 MG tablet Take 0.125 mg by mouth daily.      . diphenhydrAMINE-zinc acetate (BENADRYL) cream Apply topically 3 (three) times daily as needed for itching.  28.3 g  0  . furosemide (LASIX) 40 MG tablet Take 40 mg by mouth.      . gabapentin (NEURONTIN) 300 MG capsule Take 1 capsule (300 mg total) by mouth 3 (three) times daily. Day 1: 300 mg, Day 2: 300 mg twice daily, Day 3: 300 mg 3 times daily  60 capsule  0  . hydrALAZINE (APRESOLINE) 100 MG tablet Take 100 mg by mouth 3 (three) times daily.      [name] ibuprofen (ADVIL,MOTRIN) 200 MG tablet Take 400 mg by mouth 3 (three) times daily as needed for mild pain.      [name] insulin glargine (LANTUS) 100 UNIT/ML injection Inject 0.1-0.3 mLs (10-30 Units total) into the skin 2 (two) times daily. Take 10 U in the AM, and 35 U in the PM. Dx Code: 250.62.  10 mL  5  . metFORMIN (GLUCOPHAGE) 1000 MG tablet Take 1 tablet (1,000 mg total) by mouth daily with breakfast.  30 tablet  11  . metoprolol succinate (TOPROL-XL) 25 MG 24 hr tablet Take 50 mg (2 tablets) in the morning and 25 mg (1 tablet) in the evening.  90 tablet  3  . oxyCODONE-acetaminophen (PERCOCET) 10-325 MG per tablet Take 1 tablet by mouth every 8 (eight) hours as needed for pain.  90 tablet  0  . pantoprazole (PROTONIX) 40 MG tablet Take 40 mg by mouth daily.       . simvastatin (ZOCOR) 40 MG tablet Take 40 mg by mouth daily.       No current facility-administered medications for this encounter.   Filed Vitals:   06/06/14 1433  BP:  136/89  Weight: 268 lb (121.564 kg)  SpO2: 100%   PHYSICAL EXAM: General:  NAD. No resp difficulty; son and grand-daughters present.  HEENT: normal Neck: supple. Thick. Hard to see JVP, but appears mildly elevated. Carotids 2+ bilaterally; no bruits. No lymphadenopathy appreciated.  Cor: PMI nonpalpable; Regular rate & rhythm. No rubs, gallops or murmurs. Split s2 Lungs: clear Abdomen: obese soft, nontender, mildly distended. No hepatosplenomegaly. No bruits or masses. Good bowel sounds. Extremities: no cyanosis, clubbing, rash, trace edema LLE brace RUE 1 + edema Neuro: alert & orientedx3, cranial nerves grossly intact. Moves all 4 extremities w/o difficulty. Affect pleasant.   ASSESSMENT:  1. Chronic systolic HF, NICM likely r/t HTN, EF 35-40% (01/2014) s/p CRT-D-[employer] - NYHA II symptoms and volume status stable. Will continue lasix 40 mg daily. Optivol shows volume way above threshold but on exam she does appear overload. - Continue Toprol to 50 mg q am and 25 mg q pm. Will not uptitrae due to dizziness. I have asked her to call HF clinic if she has increased dizziness.  - Continue current doses of digoxin, hydralazine and amlodipine. Will not add Imdur due to dizziness.  - Hold off on ARB due to dizziness. No  ACE-I due to angioedmea.   - Reinforced the need and importance of daily weights, a low sodium diet, and fluid restriction (less than 2 L a day). Instructed to call the HF clinic if weight increases more than 3 lbs overnight or 5 lbs in a week.  2. Uncontrolled HTN - Much better. Continue current regimen.  3. Morbid obesity - Encouraged weight loss with portion control.  4. OSA on CPAP -Uses intermittently. Encouraged to use nightly.  5. R CVA -  She is on BB, statin and ASA. Remains weak on L side  but reports she is getting better.   F/U 1 month CLEGG,AMY NP-C 2:37 PM

## 2014-06-11 ENCOUNTER — Encounter: Payer: Medicaid Other | Admitting: Occupational Therapy

## 2014-06-11 ENCOUNTER — Ambulatory Visit: Payer: Medicaid Other

## 2014-06-11 NOTE — Telephone Encounter (Signed)
Patient reports she is getting her diabetes self monitoring supplies now with no problems

## 2014-06-18 ENCOUNTER — Ambulatory Visit: Payer: Medicaid Other | Attending: Internal Medicine

## 2014-06-18 ENCOUNTER — Encounter: Payer: Medicaid Other | Admitting: Occupational Therapy

## 2014-06-18 DIAGNOSIS — R279 Unspecified lack of coordination: Secondary | ICD-10-CM | POA: Diagnosis not present

## 2014-06-18 DIAGNOSIS — IMO0001 Reserved for inherently not codable concepts without codable children: Secondary | ICD-10-CM | POA: Diagnosis not present

## 2014-06-18 DIAGNOSIS — Z8673 Personal history of transient ischemic attack (TIA), and cerebral infarction without residual deficits: Secondary | ICD-10-CM | POA: Diagnosis not present

## 2014-06-19 ENCOUNTER — Telehealth: Payer: Self-pay | Admitting: *Deleted

## 2014-06-19 NOTE — Telephone Encounter (Signed)
Refill for citalopram 20mg .  Not on med list but pt has received n past.  Will you refill? Also proAir inhaler

## 2014-06-20 ENCOUNTER — Encounter: Payer: Medicaid Other | Attending: Physical Medicine & Rehabilitation | Admitting: Registered Nurse

## 2014-06-20 ENCOUNTER — Encounter: Payer: Self-pay | Admitting: Registered Nurse

## 2014-06-20 VITALS — BP 169/116 | HR 113 | Resp 16 | Ht 69.0 in | Wt 255.0 lb

## 2014-06-20 DIAGNOSIS — IMO0002 Reserved for concepts with insufficient information to code with codable children: Secondary | ICD-10-CM | POA: Insufficient documentation

## 2014-06-20 DIAGNOSIS — I10 Essential (primary) hypertension: Secondary | ICD-10-CM

## 2014-06-20 DIAGNOSIS — M1710 Unilateral primary osteoarthritis, unspecified knee: Secondary | ICD-10-CM | POA: Diagnosis present

## 2014-06-20 DIAGNOSIS — E1142 Type 2 diabetes mellitus with diabetic polyneuropathy: Secondary | ICD-10-CM

## 2014-06-20 DIAGNOSIS — I6350 Cerebral infarction due to unspecified occlusion or stenosis of unspecified cerebral artery: Secondary | ICD-10-CM | POA: Diagnosis present

## 2014-06-20 DIAGNOSIS — E1149 Type 2 diabetes mellitus with other diabetic neurological complication: Secondary | ICD-10-CM | POA: Diagnosis present

## 2014-06-20 DIAGNOSIS — G8114 Spastic hemiplegia affecting left nondominant side: Secondary | ICD-10-CM

## 2014-06-20 DIAGNOSIS — G8110 Spastic hemiplegia affecting unspecified side: Secondary | ICD-10-CM | POA: Insufficient documentation

## 2014-06-20 DIAGNOSIS — Z79899 Other long term (current) drug therapy: Secondary | ICD-10-CM | POA: Insufficient documentation

## 2014-06-20 DIAGNOSIS — Z5181 Encounter for therapeutic drug level monitoring: Secondary | ICD-10-CM | POA: Diagnosis present

## 2014-06-20 DIAGNOSIS — I63512 Cerebral infarction due to unspecified occlusion or stenosis of left middle cerebral artery: Secondary | ICD-10-CM

## 2014-06-20 MED ORDER — FLUTICASONE PROPIONATE 0.05 % EX CREA: TOPICAL_CREAM | Freq: Two times a day (BID) | CUTANEOUS | Status: DC

## 2014-06-20 MED ORDER — OXYCODONE-ACETAMINOPHEN 10-325 MG PO TABS: 1.0000 | ORAL_TABLET | Freq: Three times a day (TID) | ORAL | Status: DC | PRN

## 2014-06-20 NOTE — Progress Notes (Signed)
Subjective:    Patient ID: [name], female    DOB: 1966/05/26, 48 y.o.   MRN: [id number]  HPI: [name] was a 48 year old female who returns for follow up for chronic pain and medication refill. She says her pain is located in her left shoulder and left leg. She says she has been having a lot of muscle spasms in her left leg. She has been instructed to increased baclofen one tablet three times a day. Botox treatments will start next month she verbalizes understanding. She rates her pain 7. Her current exercise regime is going to physical therapy weekly and performing  stretching exercises daily. She arrived in her wheelchair, she has been using her 4 prong cane.  She arrived to office hypertensive 169/116 blood pressure rechecked 157/93.  Pain Inventory Average Pain 7 Pain Right Now 7 My pain is tingling and aching  In the last 24 hours, has pain interfered with the following? General activity 7 Relation with others 7 Enjoyment of life 7 What TIME of day is your pain at its worst? evening, night Sleep (in general) Poor  Pain is worse with: some activites Pain improves with: medication Relief from Meds: 9  Mobility walk with assistance use a cane ability to climb steps?  no do you drive?  yes use a wheelchair  Function disabled: date disabled na  Neuro/Psych bladder control problems numbness tingling trouble walking spasms dizziness loss of taste or smell  Prior Studies Any changes since last visit?  no  Physicians involved in your care Any changes since last visit?  no   Family History  Problem Relation Age of Onset  . Heart disease Mother 44    Died of MI at age 74 yo  . Heart disease Paternal Grandmother     requiring pacemaker.  [name] Heart disease Paternal Grandfather 9    Died of MI at possibly age 18-53yo  . Stroke Paternal Grandfather   . Heart disease Father 62    MI age 89yo requiring stenting  . Kidney disease Mother    requiring dialysis  . Congestive Heart Failure Mother   . Diabetes Father   . Diabetes Brother   . Heart disease Brother 40    MI at age 80 years old  . Breast cancer Paternal Aunt   . Breast cancer Maternal Grandmother    History   Social History  . Marital Status: Single    Spouse Name: N/A    Number of Children: 2  . Years of Education: 9th grade   Occupational History  . Unemployed     planning on getting disability   Social History Main Topics  . Smoking status: Never Smoker   . Smokeless tobacco: Never Used  . Alcohol Use: No  . Drug Use: No  . Sexual Activity: Yes    Birth Control/ Protection: Surgical   Other Topics Concern  . None   Social History Narrative   Lives in [place] with her son. Is able to read and write fluently in [place].   Past Surgical History  Procedure Laterality Date  . Breast biopsies Bilateral 2011    patient reports benign results  . Tubal ligation  05/31/1985  . Hand surgery Left   . Shoulder surgery Right   . Dental surgery      tumors removed   . Cardiac catheterization  05/2011  . Pacemaker/defibrillator placed  08/22/13 Left 08/22/13  . Tee without cardioversion N/A 01/14/2014  Procedure: TRANSESOPHAGEAL ECHOCARDIOGRAM (TEE);  Surgeon: Thurmon Fair, MD;  Location: [facility] ENDOSCOPY;  Service: Cardiovascular;  Laterality: N/A;   Past Medical History  Diagnosis Date  . Hypertension     Poorly controlled. Has had HTN since age 49. Angioedema with ACEI.  24 Hr urine and renal arterial dopplers ordered . . . Never done  . Type II or unspecified type diabetes mellitus without mention of complication, uncontrolled DX: 2002  . NICM (nonischemic cardiomyopathy)     EF 45-50% in 8/12, cath 6/12 showed normal coronaries, EF 50-55% by LV gram  . Morbid obesity   . Allergic rhinitis   . HLD (hyperlipidemia)   . OSA on CPAP     sleep study in 8/12 showed moderate to severe OSA requiring CPAP  . Chronic lower back pain     secondary to  DJD, obsetiy, hip problems. Followed by Dr. Ivory Broad (pain management)  . 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 Chronic diastolic heart failure      Primarily diastolic CHF: Likely due to uncontrolled HTN. Last echo (8/12) with EF 45-50%, mild to moderate LVH with some asymmetric septal hypertrophy, RV normal size and systolic function. EF 50-55% by LV-gram in 6/12.   . Degeneration of lumbar or lumbosacral intervertebral disc   . Polyneuropathy in diabetes(357.2)   . Thoracic or lumbosacral neuritis or radiculitis, unspecified   . Calcifying tendinitis of shoulder   . LBBB (left bundle branch block)   . Chronic combined systolic and diastolic CHF (congestive heart failure)     EF 40-45% by echo 12/06/2012  . Coronary artery disease     questionable. LHC 05/2011 showing normal coronaries // Followed at [facility], Dr. Shirlee Latch  . GERD (gastroesophageal reflux disease)    BP 169/116  Pulse 113  Resp 16  Ht 5\' 9"  (1.753 m)  Wt 255 lb (115.667 kg)  BMI 37.64 kg/m2  SpO2 99%  Opioid Risk Score:   Fall Risk Score: High Fall Risk (>13 points) (pt educated on flal risk, brochure given to pt previously)    Review of Systems  Constitutional: Positive for diaphoresis.  HENT:       Loss of taste or smell  Genitourinary:       Bladder control problems  Neurological: Positive for dizziness and numbness.       Tingling, spasms  All other systems reviewed and are negative.      Objective:   Physical Exam  Nursing note and vitals reviewed. Constitutional: She is oriented to person, place, and time. She appears well-developed and well-nourished.  HENT:  Head: Normocephalic and atraumatic.  Neck: Normal range of motion. Neck supple.  Cardiovascular: Normal rate and regular rhythm.   Pulmonary/Chest: Effort normal and breath sounds normal.  Musculoskeletal:  Normal Muscle Bulk and Muscle Testing Reveals: Upper Extremities: Right Arm with Full ROM and  Muscle Strength 5/5. Left Arm Flaccid : No Muscle Strength. Carrying Left Arm in a Protective Position Lower Extremities: Right Leg Full ROM and Muscle Strength 5/5 Left Leg: Paralytic Walks with a 4 prong cane/ Steppage Gait Arises from chair with slight difficulty   Neurological: She is alert and oriented to person, place, and time.  Skin: Skin is warm and dry.  Psychiatric: She has a normal mood and affect.          Assessment & Plan:  1. Chronic low back pain: Continue Current Medication Regime. Refilled: oxyCODONE 10/325mg  one tablet every  8 hours as needed.#90  2. Diabetic peripheral neuropathy: Continue to monitor  3. Recent Right MCA infarct: Left leg with spasms. Baclofen increased to 10 mg TID.Botox injection with Dr. Riley Kill.  4. OA: Continue Voltaren gel   20 minutes of face to face patient care time was spent during this visit. All questions were encouraged and answered.  F/U with Dr. Wilburt Finlay for Botox injection.

## 2014-06-24 ENCOUNTER — Observation Stay (HOSPITAL_COMMUNITY)
Admission: EM | Admit: 2014-06-24 | Discharge: 2014-06-24 | Disposition: A | Discharge status: Home / Self Care | Payer: Medicaid Other | Attending: Oncology | Admitting: Oncology

## 2014-06-24 ENCOUNTER — Encounter (HOSPITAL_COMMUNITY): Payer: Self-pay | Admitting: Emergency Medicine

## 2014-06-24 DIAGNOSIS — I69998 Other sequelae following unspecified cerebrovascular disease: Secondary | ICD-10-CM | POA: Insufficient documentation

## 2014-06-24 DIAGNOSIS — I10 Essential (primary) hypertension: Secondary | ICD-10-CM | POA: Insufficient documentation

## 2014-06-24 DIAGNOSIS — N390 Urinary tract infection, site not specified: Secondary | ICD-10-CM | POA: Diagnosis present

## 2014-06-24 DIAGNOSIS — I5042 Chronic combined systolic (congestive) and diastolic (congestive) heart failure: Secondary | ICD-10-CM

## 2014-06-24 DIAGNOSIS — M169 Osteoarthritis of hip, unspecified: Secondary | ICD-10-CM | POA: Diagnosis not present

## 2014-06-24 DIAGNOSIS — Z794 Long term (current) use of insulin: Secondary | ICD-10-CM | POA: Insufficient documentation

## 2014-06-24 DIAGNOSIS — E1149 Type 2 diabetes mellitus with other diabetic neurological complication: Secondary | ICD-10-CM | POA: Insufficient documentation

## 2014-06-24 DIAGNOSIS — I447 Left bundle-branch block, unspecified: Secondary | ICD-10-CM | POA: Insufficient documentation

## 2014-06-24 DIAGNOSIS — R29898 Other symptoms and signs involving the musculoskeletal system: Secondary | ICD-10-CM | POA: Insufficient documentation

## 2014-06-24 DIAGNOSIS — Z6841 Body Mass Index (BMI) 40.0 and over, adult: Secondary | ICD-10-CM | POA: Insufficient documentation

## 2014-06-24 DIAGNOSIS — I2510 Atherosclerotic heart disease of native coronary artery without angina pectoris: Secondary | ICD-10-CM | POA: Insufficient documentation

## 2014-06-24 DIAGNOSIS — Z7982 Long term (current) use of aspirin: Secondary | ICD-10-CM | POA: Diagnosis not present

## 2014-06-24 DIAGNOSIS — M1610 Unilateral primary osteoarthritis, unspecified hip: Secondary | ICD-10-CM | POA: Insufficient documentation

## 2014-06-24 DIAGNOSIS — R630 Anorexia: Secondary | ICD-10-CM | POA: Insufficient documentation

## 2014-06-24 DIAGNOSIS — E1142 Type 2 diabetes mellitus with diabetic polyneuropathy: Secondary | ICD-10-CM | POA: Insufficient documentation

## 2014-06-24 DIAGNOSIS — R531 Weakness: Secondary | ICD-10-CM

## 2014-06-24 DIAGNOSIS — I509 Heart failure, unspecified: Secondary | ICD-10-CM | POA: Diagnosis not present

## 2014-06-24 DIAGNOSIS — R197 Diarrhea, unspecified: Secondary | ICD-10-CM | POA: Diagnosis not present

## 2014-06-24 DIAGNOSIS — I428 Other cardiomyopathies: Secondary | ICD-10-CM | POA: Diagnosis not present

## 2014-06-24 DIAGNOSIS — G4733 Obstructive sleep apnea (adult) (pediatric): Secondary | ICD-10-CM

## 2014-06-24 DIAGNOSIS — Z950 Presence of cardiac pacemaker: Secondary | ICD-10-CM | POA: Insufficient documentation

## 2014-06-24 DIAGNOSIS — K219 Gastro-esophageal reflux disease without esophagitis: Secondary | ICD-10-CM | POA: Insufficient documentation

## 2014-06-24 DIAGNOSIS — E785 Hyperlipidemia, unspecified: Secondary | ICD-10-CM | POA: Diagnosis not present

## 2014-06-24 DIAGNOSIS — Z79899 Other long term (current) drug therapy: Secondary | ICD-10-CM | POA: Insufficient documentation

## 2014-06-24 DIAGNOSIS — Z8673 Personal history of transient ischemic attack (TIA), and cerebral infarction without residual deficits: Secondary | ICD-10-CM

## 2014-06-24 HISTORY — DX: Unspecified asthma, uncomplicated: J45.909

## 2014-06-24 HISTORY — DX: Shortness of breath: R06.02

## 2014-06-24 LAB — COMPREHENSIVE METABOLIC PANEL
ALT: 13 U/L (ref 0–35)
AST: 14 U/L (ref 0–37)
Albumin: 3.7 g/dL (ref 3.5–5.2)
Alkaline Phosphatase: 77 U/L (ref 39–117)
Anion gap: 17 — ABNORMAL HIGH (ref 5–15)
BILIRUBIN TOTAL: 0.5 mg/dL (ref 0.3–1.2)
BUN: 9 mg/dL (ref 6–23)
CALCIUM: 8.9 mg/dL (ref 8.4–10.5)
CHLORIDE: 99 meq/L (ref 96–112)
CO2: 24 meq/L (ref 19–32)
Creatinine, Ser: 0.66 mg/dL (ref 0.50–1.10)
GLUCOSE: 216 mg/dL — AB (ref 70–99)
Potassium: 3.6 mEq/L — ABNORMAL LOW (ref 3.7–5.3)
Sodium: 140 mEq/L (ref 137–147)
Total Protein: 7.1 g/dL (ref 6.0–8.3)

## 2014-06-24 LAB — URINALYSIS, ROUTINE W REFLEX MICROSCOPIC
Glucose, UA: NEGATIVE mg/dL
KETONES UR: 15 mg/dL — AB
NITRITE: POSITIVE — AB
PH: 5 (ref 5.0–8.0)
Protein, ur: 100 mg/dL — AB
Specific Gravity, Urine: 1.039 — ABNORMAL HIGH (ref 1.005–1.030)
UROBILINOGEN UA: 1 mg/dL (ref 0.0–1.0)

## 2014-06-24 LAB — CBC WITH DIFFERENTIAL/PLATELET
Basophils Absolute: 0 10*3/uL (ref 0.0–0.1)
Basophils Relative: 0 % (ref 0–1)
Eosinophils Absolute: 0.1 10*3/uL (ref 0.0–0.7)
Eosinophils Relative: 1 % (ref 0–5)
HEMATOCRIT: 34.8 % — AB (ref 36.0–46.0)
HEMOGLOBIN: 11.4 g/dL — AB (ref 12.0–15.0)
LYMPHS ABS: 2.1 10*3/uL (ref 0.7–4.0)
LYMPHS PCT: 19 % (ref 12–46)
MCH: 27.6 pg (ref 26.0–34.0)
MCHC: 32.8 g/dL (ref 30.0–36.0)
MCV: 84.3 fL (ref 78.0–100.0)
MONO ABS: 0.9 10*3/uL (ref 0.1–1.0)
Monocytes Relative: 8 % (ref 3–12)
NEUTROS ABS: 8 10*3/uL — AB (ref 1.7–7.7)
Neutrophils Relative %: 72 % (ref 43–77)
Platelets: 218 10*3/uL (ref 150–400)
RBC: 4.13 MIL/uL (ref 3.87–5.11)
RDW: 13.1 % (ref 11.5–15.5)
WBC: 11.1 10*3/uL — AB (ref 4.0–10.5)

## 2014-06-24 LAB — LIPASE, BLOOD: Lipase: 13 U/L (ref 11–59)

## 2014-06-24 LAB — URINE MICROSCOPIC-ADD ON

## 2014-06-24 LAB — I-STAT CG4 LACTIC ACID, ED: LACTIC ACID, VENOUS: 2.41 mmol/L — AB (ref 0.5–2.2)

## 2014-06-24 LAB — GLUCOSE, CAPILLARY
Glucose-Capillary: 167 mg/dL — ABNORMAL HIGH (ref 70–99)
Glucose-Capillary: 139 mg/dL — ABNORMAL HIGH (ref 70–99)

## 2014-06-24 LAB — CBG MONITORING, ED: Glucose-Capillary: 217 mg/dL — ABNORMAL HIGH (ref 70–99)

## 2014-06-24 MED ORDER — METOPROLOL SUCCINATE ER 25 MG PO TB24
25.0000 mg | ORAL_TABLET | Freq: Every evening | ORAL | Status: DC
  Filled 2014-06-24: qty 1

## 2014-06-24 MED ORDER — BACLOFEN 5 MG HALF TABLET
5.0000 mg | ORAL_TABLET | Freq: Three times a day (TID) | ORAL | Status: DC
Start: 2014-06-24 — End: 2014-06-24

## 2014-06-24 MED ORDER — ONDANSETRON HCL 4 MG PO TABS: 4.0000 mg | ORAL_TABLET | Freq: Four times a day (QID) | ORAL | Status: DC | PRN

## 2014-06-24 MED ORDER — AMLODIPINE BESYLATE 5 MG PO TABS
5.0000 mg | ORAL_TABLET | Freq: Every day | ORAL | Status: DC
  Administered 2014-06-24: 5 mg via ORAL
  Filled 2014-06-24: qty 1

## 2014-06-24 MED ORDER — SIMVASTATIN 40 MG PO TABS
40.0000 mg | ORAL_TABLET | Freq: Every day | ORAL | Status: DC
  Administered 2014-06-24: 40 mg via ORAL
  Filled 2014-06-24: qty 1

## 2014-06-24 MED ORDER — ENOXAPARIN SODIUM 40 MG/0.4ML ~~LOC~~ SOLN
40.0000 mg | SUBCUTANEOUS | Status: DC
  Administered 2014-06-24: 40 mg via SUBCUTANEOUS
  Filled 2014-06-24: qty 0.4

## 2014-06-24 MED ORDER — SODIUM CHLORIDE 0.9 % IV SOLN
1000.0000 mL | Freq: Once | INTRAVENOUS | Status: AC
  Administered 2014-06-24: 1000 mL via INTRAVENOUS

## 2014-06-24 MED ORDER — HYDRALAZINE HCL 50 MG PO TABS
100.0000 mg | ORAL_TABLET | Freq: Three times a day (TID) | ORAL | Status: DC
  Administered 2014-06-24: 100 mg via ORAL
  Filled 2014-06-24 (×3): qty 2

## 2014-06-24 MED ORDER — GABAPENTIN 300 MG PO CAPS
300.0000 mg | ORAL_CAPSULE | Freq: Three times a day (TID) | ORAL | Status: DC
  Administered 2014-06-24: 300 mg via ORAL
  Filled 2014-06-24 (×3): qty 1

## 2014-06-24 MED ORDER — OXYCODONE-ACETAMINOPHEN 5-325 MG PO TABS: 1.0000 | ORAL_TABLET | Freq: Three times a day (TID) | ORAL | Status: DC | PRN

## 2014-06-24 MED ORDER — NYSTATIN 100000 UNIT/GM EX POWD
Freq: Two times a day (BID) | CUTANEOUS | Status: DC
  Administered 2014-06-24: 10:00:00 via TOPICAL
  Filled 2014-06-24: qty 15

## 2014-06-24 MED ORDER — CYCLOBENZAPRINE HCL 10 MG PO TABS: 10.0000 mg | ORAL_TABLET | Freq: Three times a day (TID) | ORAL | Status: DC | PRN

## 2014-06-24 MED ORDER — ATORVASTATIN CALCIUM 20 MG PO TABS
20.0000 mg | ORAL_TABLET | Freq: Every day | ORAL | Status: DC
  Filled 2014-06-24: qty 1

## 2014-06-24 MED ORDER — ONDANSETRON HCL 4 MG/2ML IJ SOLN: 4.0000 mg | Freq: Four times a day (QID) | INTRAMUSCULAR | Status: DC | PRN

## 2014-06-24 MED ORDER — PANTOPRAZOLE SODIUM 40 MG PO TBEC
40.0000 mg | DELAYED_RELEASE_TABLET | Freq: Every day | ORAL | Status: DC
  Administered 2014-06-24: 40 mg via ORAL
  Filled 2014-06-24: qty 1

## 2014-06-24 MED ORDER — BACLOFEN 5 MG HALF TABLET
5.0000 mg | ORAL_TABLET | Freq: Three times a day (TID) | ORAL | Status: DC
  Administered 2014-06-24: 5 mg via ORAL
  Filled 2014-06-24 (×3): qty 1

## 2014-06-24 MED ORDER — CARBAMAZEPINE 200 MG PO TABS
200.0000 mg | ORAL_TABLET | Freq: Two times a day (BID) | ORAL | Status: DC
  Administered 2014-06-24: 200 mg via ORAL
  Filled 2014-06-24 (×2): qty 1

## 2014-06-24 MED ORDER — OXYCODONE HCL 5 MG PO TABS: 5.0000 mg | ORAL_TABLET | Freq: Three times a day (TID) | ORAL | Status: DC | PRN

## 2014-06-24 MED ORDER — SULFAMETHOXAZOLE-TMP DS 800-160 MG PO TABS: 1.0000 | ORAL_TABLET | Freq: Two times a day (BID) | ORAL | Status: DC

## 2014-06-24 MED ORDER — INSULIN ASPART 100 UNIT/ML ~~LOC~~ SOLN
0.0000 [IU] | Freq: Three times a day (TID) | SUBCUTANEOUS | Status: DC
  Administered 2014-06-24: 2 [IU] via SUBCUTANEOUS
  Administered 2014-06-24: 3 [IU] via SUBCUTANEOUS

## 2014-06-24 MED ORDER — DIGOXIN 125 MCG PO TABS
0.1250 mg | ORAL_TABLET | Freq: Every day | ORAL | Status: DC
  Administered 2014-06-24: 0.125 mg via ORAL
  Filled 2014-06-24: qty 1

## 2014-06-24 MED ORDER — CLONIDINE HCL 0.2 MG PO TABS
0.2000 mg | ORAL_TABLET | Freq: Two times a day (BID) | ORAL | Status: DC
  Administered 2014-06-24: 0.2 mg via ORAL
  Filled 2014-06-24 (×2): qty 1

## 2014-06-24 MED ORDER — DEXTROSE 5 % IV SOLN
1.0000 g | Freq: Once | INTRAVENOUS | Status: AC
  Administered 2014-06-24: 1 g via INTRAVENOUS
  Filled 2014-06-24: qty 10

## 2014-06-24 MED ORDER — SODIUM CHLORIDE 0.9 % IV SOLN: 1000.0000 mL | Freq: Once | INTRAVENOUS | Status: DC

## 2014-06-24 MED ORDER — METOPROLOL SUCCINATE ER 50 MG PO TB24
50.0000 mg | ORAL_TABLET | Freq: Every morning | ORAL | Status: DC
  Administered 2014-06-24: 50 mg via ORAL
  Filled 2014-06-24: qty 1

## 2014-06-24 MED ORDER — OXYCODONE-ACETAMINOPHEN 10-325 MG PO TABS: 1.0000 | ORAL_TABLET | Freq: Three times a day (TID) | ORAL | Status: DC | PRN

## 2014-06-24 MED ORDER — SODIUM CHLORIDE 0.9 % IV SOLN: 1000.0000 mL | INTRAVENOUS | Status: DC

## 2014-06-24 MED ORDER — ASPIRIN 325 MG PO TABS
325.0000 mg | ORAL_TABLET | Freq: Every day | ORAL | Status: DC
  Administered 2014-06-24: 325 mg via ORAL
  Filled 2014-06-24: qty 1

## 2014-06-24 NOTE — H&P (Signed)
Attending physician admission note: I personally interviewed and examined this patient. Medical history, active problems, physical findings, evaluation and management plan, accurate as recorded above by resident physician Dr. Jill Alexanders. Clinical summary: One of  frequent hospital admissions for this 48 year old woman with poorly controlled hypertension, poorly controlled type 2 diabetes, now insulin-dependent, most recent hemoglobin A1c 12.1 on 03/25/2014, nonischemic cardiomyopathy, status post previous ICD placed in September 2014, status post right MCA stroke in January of this year, obesity-sleep apnea syndrome on CPAP, who presents with generalized weakness, lightheadedness, diaphoresis, and fatigue without any acute chest pain, or dyspnea.. She has no new focal physical findings compared with prior exams. She was found to have an asymptomatic urinary tract infection. No major electrolyte abnormalities. Initial glucose 216. White blood count 11,000 which is close toher chronic baseline of 10,000. Current exam: Obese African American woman. Sleepy but in no distress Blood pressure 139/84, pulse 101, temperature 98.2 F (36.8 C), temperature source Oral, resp. rate 16, height 5\' 9"  (1.753 m), weight 280 lb (127.007 kg), last menstrual period 06/12/2014, SpO2 97.00%. Lungs are clear. Regular cardiac rhythm. No murmur. Abdomen soft and nontender. No suprapubic tenderness. Extremities no edema. No calf tenderness. Neurologic: Dense left hemiplegia with inability to move her upper or lower extremity. Motor strength 5 over 5 on the right side. Reflexes absent symmetric at the knees. Impression: Nonspecific weakness in a lady with multiple medical problems. No localizing findings on today's exam except for chronic fixed neurologic deficits, left hemiplegia, secondary to recent stroke. She is found to have an asymptomatic urinary tract infection. Plan: Begin antibiotic therapy for her urinary tract  infection. Continue current medications. Anticipate discharge soon. She will followup in our Gen. internal medicine clinic.

## 2014-06-24 NOTE — ED Provider Notes (Signed)
CSN: [id number]     Arrival date & time 06/24/14  0046 History   First MD Initiated Contact with Patient 06/24/14 0131     Chief Complaint  Patient presents with  . Diarrhea     (Consider location/radiation/quality/duration/timing/severity/associated sxs/prior Treatment) HPI 48 year old female presents to emergency department from home via EMS with complaint of weakness, diarrhea and general malaise.  Per EMS, family reported that patient was less responsive today, diaphoretic at times.  Patient has history of stroke, left-sided deficits.  She has history of diabetes, hypertension CHF.  Patient denies any fever or chills.  No unusual foods, no known sick contacts.  She reports that she took one dose of Imodium with improvement in diarrhea.  No nausea or vomiting.  No abdominal pain.  She denies any urinary symptoms. Past Medical History  Diagnosis Date  . Hypertension     Poorly controlled. Has had HTN since age 21. Angioedema with ACEI.  24 Hr urine and renal arterial dopplers ordered . . . Never done  . Type II or unspecified type diabetes mellitus without mention of complication, uncontrolled DX: 2002  . NICM (nonischemic cardiomyopathy)     EF 45-50% in 8/12, cath 6/12 showed normal coronaries, EF 50-55% by LV gram  . Morbid obesity   . Allergic rhinitis   . HLD (hyperlipidemia)   . OSA on CPAP     sleep study in 8/12 showed moderate to severe OSA requiring CPAP  . Chronic lower back pain     secondary to DJD, obsetiy, hip problems. Followed by Dr. Ivory Broad (pain management)  . 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 Chronic diastolic heart failure      Primarily diastolic CHF: Likely due to uncontrolled HTN. Last echo (8/12) with EF 45-50%, mild to moderate LVH with some asymmetric septal hypertrophy, RV normal size and systolic function. EF 50-55% by LV-gram in 6/12.   . Degeneration of lumbar or lumbosacral intervertebral disc   . Polyneuropathy in  diabetes(357.2)   . Thoracic or lumbosacral neuritis or radiculitis, unspecified   . Calcifying tendinitis of shoulder   . LBBB (left bundle branch block)   . Chronic combined systolic and diastolic CHF (congestive heart failure)     EF 40-45% by echo 12/06/2012  . Coronary artery disease     questionable. LHC 05/2011 showing normal coronaries // Followed at [facility], Dr. Shirlee Latch  . GERD (gastroesophageal reflux disease)    Past Surgical History  Procedure Laterality Date  . Breast biopsies Bilateral 2011    patient reports benign results  . Tubal ligation  05/31/1985  . Hand surgery Left   . Shoulder surgery Right   . Dental surgery      tumors removed   . Cardiac catheterization  05/2011  . Pacemaker/defibrillator placed  08/22/13 Left 08/22/13  . Tee without cardioversion N/A 01/14/2014    Procedure: TRANSESOPHAGEAL ECHOCARDIOGRAM (TEE);  Surgeon: Thurmon Fair, MD;  Location: [facility] ENDOSCOPY;  Service: Cardiovascular;  Laterality: N/A;   Family History  Problem Relation Age of Onset  . Heart disease Mother 52    Died of MI at age 46 yo  . Heart disease Paternal Grandmother     requiring pacemaker.  [name] Heart disease Paternal Grandfather 5    Died of MI at possibly age 73-53yo  . Stroke Paternal Grandfather   . Heart disease Father 8    MI age 28yo requiring stenting  . Kidney disease Mother  requiring dialysis  . Congestive Heart Failure Mother   . Diabetes Father   . Diabetes Brother   . Heart disease Brother 80    MI at age 74 years old  . Breast cancer Paternal Aunt   . Breast cancer Maternal Grandmother    History  Substance Use Topics  . Smoking status: Never Smoker   . Smokeless tobacco: Never Used  . Alcohol Use: No   OB History   Grav Para Term Preterm Abortions TAB SAB Ect Mult Living                 Review of Systems    See History of Present Illness; otherwise all other systems are reviewed and negative Allergies  Clindamycin; Enalapril  maleate; and Lisinopril  Home Medications   Prior to Admission medications   Medication Sig Start Date End Date Taking? Authorizing Provider  amLODipine (NORVASC) 5 MG tablet Take 1 tablet (5 mg total) by mouth daily. 04/22/14  Yes Dolores Patty, MD  aspirin 325 MG tablet Take 1 tablet (325 mg total) by mouth daily. 05/22/14  Yes Ranelle Oyster, MD  baclofen (LIORESAL) 10 MG tablet Take 0.5-1 tablets (5-10 mg total) by mouth 3 (three) times daily. 05/22/14  Yes Ranelle Oyster, MD  carbamazepine (TEGRETOL) 200 MG tablet Take 200 mg by mouth 2 (two) times daily.   Yes Historical Provider, MD  cloNIDine (CATAPRES) 0.2 MG tablet Take 0.2 mg by mouth 2 (two) times daily.   Yes Historical Provider, MD  cyclobenzaprine (FLEXERIL) 10 MG tablet Take 10 mg by mouth 3 (three) times daily as needed for muscle spasms.   Yes Historical Provider, MD  diclofenac sodium (VOLTAREN) 1 % GEL Apply 2 g topically 4 (four) times daily as needed (pain). 05/22/14  Yes Ranelle Oyster, MD  digoxin (LANOXIN) 0.125 MG tablet Take 0.125 mg by mouth daily.   Yes Historical Provider, MD  diphenhydrAMINE-zinc acetate (BENADRYL) cream Apply topically 3 (three) times daily as needed for itching. 03/25/14  Yes Arturo Morton, MD  fluticasone (CUTIVATE) 0.05 % cream Apply topically 2 (two) times daily. Dispense two tubes 06/20/14  Yes Jacalyn Lefevre, NP  furosemide (LASIX) 40 MG tablet Take 40 mg by mouth.   Yes Historical Provider, MD  gabapentin (NEURONTIN) 300 MG capsule Take 1 capsule (300 mg total) by mouth 3 (three) times daily. Day 1: 300 mg, Day 2: 300 mg twice daily, Day 3: 300 mg 3 times daily 05/11/14  Yes Lauren Doretha Imus, PA-C  hydrALAZINE (APRESOLINE) 100 MG tablet Take 100 mg by mouth 3 (three) times daily.   Yes Historical Provider, MD  ibuprofen (ADVIL,MOTRIN) 200 MG tablet Take 400 mg by mouth 3 (three) times daily as needed for mild pain.   Yes Historical Provider, MD  insulin glargine (LANTUS) 100 UNIT/ML  injection Inject 0.1-0.3 mLs (10-30 Units total) into the skin 2 (two) times daily. Take 10 U in the AM, and 35 U in the PM. Dx Code: 250.62. 05/23/14  Yes Courtney Paris, MD  metFORMIN (GLUCOPHAGE) 1000 MG tablet Take 1 tablet (1,000 mg total) by mouth daily with breakfast. 04/12/14 04/12/15 Yes Courtney Paris, MD  metoprolol succinate (TOPROL-XL) 25 MG 24 hr tablet Take 50 mg (2 tablets) in the morning and 25 mg (1 tablet) in the evening. 05/08/14  Yes Aundria Rud, NP  oxyCODONE-acetaminophen (PERCOCET) 10-325 MG per tablet Take 1 tablet by mouth every 8 (eight) hours as needed for pain. 06/20/14  Yes Jacalyn Lefevre, NP  pantoprazole (PROTONIX) 40 MG tablet Take 40 mg by mouth daily.   Yes Historical Provider, MD  simvastatin (ZOCOR) 40 MG tablet Take 40 mg by mouth daily.   Yes Historical Provider, MD   BP 114/68  Pulse 99  Temp(Src) 97.6 F (36.4 C) (Oral)  Resp 15  Ht 5\' 9"  (1.753 m)  Wt 256 lb (116.121 kg)  BMI 37.79 kg/m2  SpO2 97%  LMP 06/12/2014 Physical Exam  Nursing note and vitals reviewed. Constitutional: She is oriented to person, place, and time. She appears well-developed and well-nourished. No distress.  Chronically ill-appearing, morbidly obese, in no acute distress.  HENT:  Head: Normocephalic and atraumatic.  Nose: Nose normal.  Dry mucous membranes  Eyes: Conjunctivae and EOM are normal. Pupils are equal, round, and reactive to light.  Neck: Normal range of motion. Neck supple. No JVD present. No tracheal deviation present. No thyromegaly present.  Cardiovascular: Normal rate, regular rhythm, normal heart sounds and intact distal pulses.  Exam reveals no gallop and no friction rub.   No murmur heard. Pulmonary/Chest: Effort normal and breath sounds normal. No stridor. No respiratory distress. She has no wheezes. She has no rales. She exhibits no tenderness.  Abdominal: Soft. She exhibits no distension and no mass. There is no tenderness. There is no rebound and no guarding.   Mildly hyperactive bowel sounds  Musculoskeletal: Normal range of motion. She exhibits no edema and no tenderness.  Lymphadenopathy:    She has no cervical adenopathy.  Neurological: She is alert and oriented to person, place, and time. She exhibits normal muscle tone. Coordination (left-sided deficit) abnormal.  Skin: Skin is dry. No rash noted. No erythema. No pallor.  Psychiatric: She has a normal mood and affect. Her behavior is normal. Judgment and thought content normal.    ED Course  Procedures (including critical care time) Labs Review Labs Reviewed  CBC WITH DIFFERENTIAL - Abnormal; Notable for the following:    WBC 11.1 (*)    Hemoglobin 11.4 (*)    HCT 34.8 (*)    Neutro Abs 8.0 (*)    All other components within normal limits  COMPREHENSIVE METABOLIC PANEL - Abnormal; Notable for the following:    Potassium 3.6 (*)    Glucose, Bld 216 (*)    Anion gap 17 (*)    All other components within normal limits  URINALYSIS, ROUTINE W REFLEX MICROSCOPIC - Abnormal; Notable for the following:    Color, Urine ORANGE (*)    APPearance TURBID (*)    Specific Gravity, Urine 1.039 (*)    Hgb urine dipstick LARGE (*)    Bilirubin Urine SMALL (*)    Ketones, ur 15 (*)    Protein, ur 100 (*)    Nitrite POSITIVE (*)    Leukocytes, UA SMALL (*)    All other components within normal limits  CBG MONITORING, ED - Abnormal; Notable for the following:    Glucose-Capillary 217 (*)    All other components within normal limits  I-STAT CG4 LACTIC ACID, ED - Abnormal; Notable for the following:    Lactic Acid, Venous 2.41 (*)    All other components within normal limits  URINE CULTURE  LIPASE, BLOOD  URINE MICROSCOPIC-ADD ON  GI PATHOGEN PANEL BY PCR, STOOL    Imaging Review No results found.   EKG Interpretation None      MDM   Final diagnoses:  UTI (lower urinary tract infection)  Diarrhea  Weakness  48 year old female with general malaise over the last several days,  diarrhea times one day.  Blood pressures here lower than her norm.  She has received IV fluids.  She's had no diarrhea here.  Patient has urinary tract infection.  Will treat for same.  OPC to see and evaluate for admission vs close f/u in clinic.    Olivia Mackie, MD 06/24/14 [phone]

## 2014-06-24 NOTE — Discharge Summary (Signed)
Name: [name] MRN: [id number] DOB: 02/01/1966 48 y.o. PCP: Courtney Paris, MD  Date of Admission: 06/24/2014 12:46 AM Date of Discharge: 06/24/2014 Attending Physician: Levert Feinstein, MD  Discharge Diagnosis: 1. UTI 2. Diarrhea- Resolved 3. HTN 4. Type II DM with neuropathy 5. 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 HLD 7. CAD 8. OSA 9. H/o CVA  Discharge Medications:   Medication List         amLODipine 5 MG tablet  Commonly known as:  NORVASC  Take 1 tablet (5 mg total) by mouth daily.     aspirin 325 MG tablet  Take 1 tablet (325 mg total) by mouth daily.     baclofen 10 MG tablet  Commonly known as:  LIORESAL  Take 0.5-1 tablets (5-10 mg total) by mouth 3 (three) times daily.     carbamazepine 200 MG tablet  Commonly known as:  TEGRETOL  Take 200 mg by mouth 2 (two) times daily.     cloNIDine 0.2 MG tablet  Commonly known as:  CATAPRES  Take 0.2 mg by mouth 2 (two) times daily.     cyclobenzaprine 10 MG tablet  Commonly known as:  FLEXERIL  Take 10 mg by mouth 3 (three) times daily as needed for muscle spasms.     diclofenac sodium 1 % Gel  Commonly known as:  VOLTAREN  Apply 2 g topically 4 (four) times daily as needed (pain).     digoxin 0.125 MG tablet  Commonly known as:  LANOXIN  Take 0.125 mg by mouth daily.     diphenhydrAMINE-zinc acetate cream  Commonly known as:  BENADRYL  Apply topically 3 (three) times daily as needed for itching.     fluticasone 0.05 % cream  Commonly known as:  CUTIVATE  Apply topically 2 (two) times daily. Dispense two tubes     furosemide 40 MG tablet  Commonly known as:  LASIX  Take 40 mg by mouth.     gabapentin 300 MG capsule  Commonly known as:  NEURONTIN  Take 1 capsule (300 mg total) by mouth 3 (three) times daily. Day 1: 300 mg, Day 2: 300 mg twice daily, Day 3: 300 mg 3 times daily     hydrALAZINE 100 MG tablet  Commonly known as:  APRESOLINE    Take 100 mg by mouth 3 (three) times daily.     ibuprofen 200 MG tablet  Commonly known as:  ADVIL,MOTRIN  Take 400 mg by mouth 3 (three) times daily as needed for mild pain.     insulin glargine 100 UNIT/ML injection  Commonly known as:  LANTUS  Inject 0.1-0.3 mLs (10-30 Units total) into the skin 2 (two) times daily. Take 10 U in the AM, and 35 U in the PM. Dx Code: 250.62.     metFORMIN 1000 MG tablet  Commonly known as:  GLUCOPHAGE  Take 1 tablet (1,000 mg total) by mouth daily with breakfast.     metoprolol succinate 25 MG 24 hr tablet  Commonly known as:  TOPROL-XL  Take 50 mg (2 tablets) in the morning and 25 mg (1 tablet) in the evening.     oxyCODONE-acetaminophen 10-325 MG per tablet  Commonly known as:  PERCOCET  Take 1 tablet by mouth every 8 (eight) hours as needed for pain.     pantoprazole 40 MG tablet  Commonly known as:  PROTONIX  Take 40 mg by mouth daily.     simvastatin 40  MG tablet  Commonly known as:  ZOCOR  Take 40 mg by mouth daily.     sulfamethoxazole-trimethoprim 800-160 MG per tablet  Commonly known as:  BACTRIM DS  Take 1 tablet by mouth every 12 (twelve) hours.  Start taking on:  06/25/2014        Disposition and follow-up:   CaitlynCaitlyn Jacobs was discharged from [facility] in Good condition.  At the hospital follow up visit please address:  1.  Recent UTI, diabetes control  2.  Labs / imaging needed at time of follow-up: None  3.  Pending labs/ test needing follow-up: None  Follow-up Appointments: None needed at this time   Discharge Instructions: Discharge Instructions   Diet - low sodium heart healthy    Complete by:  As directed      Discharge instructions    Complete by:  As directed   Please complete your course of Bactrim. You will take 1 pill every 12 hours for the next 9 days.     Increase activity slowly    Complete by:  As directed            Consultations:  None  Procedures Performed:  No  results found.  Admission HPI: [name] is a 48yo woman with PMHx of HTN, Type II DM, non-ischemic cardiomyopathy with chronic diastolic and systolic heart failure, morbid obesity, HLD, CAD, and h/o of CVA who presents to the ED with complaint of "not feeling right" and "not responding." Pt states that her grandchild told her daughter that she wasn't responding to her earlier this evening. Pt states she remembers this and "just feels bad." She states that she started feeling like her blood pressure was low. She states she got sweaty, dizzy and weak. She says she has had a decrease in appetite for the past few months, but she has stayed well hydrated. She also reports diarrhea for one day. She has been taking all of her medications as prescribed and denies any use of alcohol or illicit drugs. Pt denies fever, chills, chest pain, shortness of breath, abdominal pain.    Hospital Course by problem list:  1. UTI: Patient denied any urinary symptoms, including dysuria, urgency or frequency, flank pain, fevers or chills, hematuria. She admitted to feeling dizzy and lightheaded. Vitals on admission: afebrile 97.6 BP 136/89, HR 113, RR 16, Pulse ox 100% on 2 L oxygen via . WBC 11.1. Lactic Acid 2.41. Urinalysis showed turbid urine with small leukocytes, +nitrites. She received a dose of Ceftriaxone 1g in the ED. She was started on Bactrim 800-160mg  1 tab PO BID and advised to continue Bactrim as outpatient for 9 more days.   2. Diarrhea- Resolved: Complained of diarrhea x 1 last night. She did not have any further episodes of diarrhea. Single episodes could have been secondary to UTI.   3. HTN: BP on admission 136/89 down to 104/56 in the ED. Renal artery duplex in 04/28/12 showed NO renal artery stenosis. Pt on amlodipine 5 mg daily, clonidine 0.2 mg BID, hydralazine 100 mg TID, metoprolol succinate 25 mg 2 tabs in the morning and 1 at night. Home meds held last night. BP now 140-150s/90-100s. Home meds  resumed.   4. Type II DM with neuropathy: Glucose 216 on admission. Last hemoglobin A1c 12.1 on 03/25/14. Pt on metformin 1000 mg daily, Lantus 10 units in the morning and 35 units at bedtime, and gabapentin 300 mg TID at home.  Patient placed on moderate ISS  TID with meals. Metformin to be resumed upon discharge.  5. 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 Most recent echocardiogram in 01/14/14 shows EF of 35-40%. Patient denies any chest pain, shortness of breath or palpitations. On amlodipine 5 mg daily, clonidine 0.2 mg BID, hydralazine 100 mg TID, metoprolol succinate 25 mg 2 tabs in the morning and 1 at night, simvastatin 40 mg daily, digoxin 0.125 mg daily, asa 325 mg daily and furosemide 40 mg daily. Home medications were continued.  6. HLD: Most recent lipid panel on 01/10/14 shows cholesterol 187, TG 194, HDL 43, LDL 105. She takes Simvastatin 40mg  daily at home. This was continued during her hospitalization.  7. CAD: LHC 05/2011 showing normal coronaries. Pt is followed at [facility], [facility]. Pt on ASA, metoprolol and simvastatin at home. These medications were continued.  8. OSA: On CPAP at home, this was continued.   9. H/o CVA: CVA in January of 2015. Pt has residual left sided weakness in upper and lower extremities. 1/5 on exam. 5/5 on the right side. No focal deficits. CN II-XII grossly intact. On amlodipine 5 mg daily, clonidine 0.2 mg BID, hydralazine 100 mg TID, metoprolol succinate 25 mg 2 tabs in the morning and 1 at night, simvastatin 40 mg daily, asa 325 mg daily. Her home medications were continued.    Discharge Vitals:   BP 139/84  Pulse 101  Temp(Src) 98.2 F (36.8 C) (Oral)  Resp 16  Ht 5\' 9"  (1.753 m)  Wt 280 lb (127.007 kg)  BMI 41.33 kg/m2  SpO2 97%  LMP 06/12/2014 Physical Exam  General: NAD, cooperative  HEENT: [place]/AT, EOMI, PERRL, sclera anicteric, moist mucus membranes  Neck:  supple, no JVD, no carotid bruits  CV: RRR, normal S1/S2, no m/g/r  Pulm: CTA bilaterally, no wheezes, rhonchi, crackles  Abd: BS+, soft, nondistended, nontender, no organomegaly  Ext: warm, moves all, no edema  Msk: no joint deformities, ROM full and nontender  Discharge Labs:  Results for orders placed during the hospital encounter of 06/24/14 (from the past 24 hour(s))  CBG MONITORING, ED     Status: Abnormal   Collection Time    06/24/14  1:12 AM      Result Value Ref Range   Glucose-Capillary 217 (*) 70 - 99 mg/dL  CBC WITH DIFFERENTIAL     Status: Abnormal   Collection Time    06/24/14  1:48 AM      Result Value Ref Range   WBC 11.1 (*) 4.0 - 10.5 K/uL   RBC 4.13  3.87 - 5.11 MIL/uL   Hemoglobin 11.4 (*) 12.0 - 15.0 g/dL   HCT 06/26/14 (*) 06/26/14 - 06.2 %   MCV 84.3  78.0 - 100.0 fL   MCH 27.6  26.0 - 34.0 pg   MCHC 32.8  30.0 - 36.0 g/dL   RDW 69.4  85.4 - 62.7 %   Platelets 218  150 - 400 K/uL   Neutrophils Relative % 72  43 - 77 %   Neutro Abs 8.0 (*) 1.7 - 7.7 K/uL   Lymphocytes Relative 19  12 - 46 %   Lymphs Abs 2.1  0.7 - 4.0 K/uL   Monocytes Relative 8  3 - 12 %   Monocytes Absolute 0.9  0.1 - 1.0 K/uL   Eosinophils Relative 1  0 - 5 %   Eosinophils Absolute 0.1  0.0 - 0.7 K/uL   Basophils Relative 0  0 -  1 %   Basophils Absolute 0.0  0.0 - 0.1 K/uL  COMPREHENSIVE METABOLIC PANEL     Status: Abnormal   Collection Time    06/24/14  1:48 AM      Result Value Ref Range   Sodium 140  137 - 147 mEq/L   Potassium 3.6 (*) 3.7 - 5.3 mEq/L   Chloride 99  96 - 112 mEq/L   CO2 24  19 - 32 mEq/L   Glucose, Bld 216 (*) 70 - 99 mg/dL   BUN 9  6 - 23 mg/dL   Creatinine, Ser 8.84  0.50 - 1.10 mg/dL   Calcium 8.9  8.4 - 16.6 mg/dL   Total Protein 7.1  6.0 - 8.3 g/dL   Albumin 3.7  3.5 - 5.2 g/dL   AST 14  0 - 37 U/L   ALT 13  0 - 35 U/L   Alkaline Phosphatase 77  39 - 117 U/L   Total Bilirubin 0.5  0.3 - 1.2 mg/dL   GFR calc non Af Amer >90  >90 mL/min   GFR calc Af  Amer >90  >90 mL/min   Anion gap 17 (*) 5 - 15  LIPASE, BLOOD     Status: None   Collection Time    06/24/14  1:48 AM      Result Value Ref Range   Lipase 13  11 - 59 U/L  I-STAT CG4 LACTIC ACID, ED     Status: Abnormal   Collection Time    06/24/14  1:56 AM      Result Value Ref Range   Lactic Acid, Venous 2.41 (*) 0.5 - 2.2 mmol/L  URINALYSIS, ROUTINE W REFLEX MICROSCOPIC     Status: Abnormal   Collection Time    06/24/14  3:30 AM      Result Value Ref Range   Color, Urine ORANGE (*) YELLOW   APPearance TURBID (*) CLEAR   Specific Gravity, Urine 1.039 (*) 1.005 - 1.030   pH 5.0  5.0 - 8.0   Glucose, UA NEGATIVE  NEGATIVE mg/dL   Hgb urine dipstick LARGE (*) NEGATIVE   Bilirubin Urine SMALL (*) NEGATIVE   Ketones, ur 15 (*) NEGATIVE mg/dL   Protein, ur 063 (*) NEGATIVE mg/dL   Urobilinogen, UA 1.0  0.0 - 1.0 mg/dL   Nitrite POSITIVE (*) NEGATIVE   Leukocytes, UA SMALL (*) NEGATIVE  URINE MICROSCOPIC-ADD ON     Status: None   Collection Time    06/24/14  3:30 AM      Result Value Ref Range   Squamous Epithelial / LPF RARE  RARE   WBC, UA 21-50  <3 WBC/hpf   RBC / HPF 3-6  <3 RBC/hpf   Bacteria, UA RARE  RARE   Urine-Other MANY YEAST    GLUCOSE, CAPILLARY     Status: Abnormal   Collection Time    06/24/14  8:12 AM      Result Value Ref Range   Glucose-Capillary 167 (*) 70 - 99 mg/dL  GLUCOSE, CAPILLARY     Status: Abnormal   Collection Time    06/24/14 12:38 PM      Result Value Ref Range   Glucose-Capillary 139 (*) 70 - 99 mg/dL    Signed: Rich Number, MD 06/24/2014, 1:07 PM    Services Ordered on Discharge: None Equipment Ordered on Discharge: None

## 2014-06-24 NOTE — ED Notes (Signed)
Pt. Sat's will drop at times in 70's and then come up to 90's quickly.  Placed pt. On [place] o2 2l and pt. Maintained sats > 92%

## 2014-06-24 NOTE — ED Notes (Addendum)
Per ems-- pt presents with hypotension 100/60 was last bp. Pt has not been feeling well over past couple days- diarrhea started yesterday. Family reprots that she was not as responsive today and diaphoretic. Pt with hx of stroke with L deficeits. ems administered 4mg  zofran and 100cc NS. 20G IV R wrist. Pt has pace maker. Denies cp, sob.

## 2014-06-24 NOTE — Progress Notes (Signed)
Patient trasfered from ED  to [phone] via stretcher; alert and oriented x 4; no complaints of pain; IV saline locked in RW ; skin intact. Orient patient to room and unit; watch safety video; gave patient care guide; instructed how to use the call bell and  fall risk precautions. Will continue to monitor the patient.

## 2014-06-24 NOTE — Care Management Note (Signed)
    Page 1 of 1   06/24/2014     11:17:33 AM CARE MANAGEMENT NOTE 06/24/2014  Patient:  [name], [name]   Account Number:  [id number]  Date Initiated:  06/24/2014  Documentation initiated by:  Letha Cape  Subjective/Objective Assessment:   dx uti  admit as observation- from home. pta indep     Action/Plan:   Anticipated DC Date:  06/24/2014   Anticipated DC Plan:  HOME/SELF CARE      DC Planning Services  CM consult      Choice offered to / List presented to:             Status of service:  Completed, signed off Medicare Important Message given?   (If response is "NO", the following Medicare IM given date fields will be blank) Date Medicare IM given:   Medicare IM given by:   Date Additional Medicare IM given:   Additional Medicare IM given by:    Discharge Disposition:  HOME/SELF CARE  Per UR Regulation:  Reviewed for med. necessity/level of care/duration of stay  If discussed at Long Length of Stay Meetings, dates discussed:    Comments:

## 2014-06-24 NOTE — H&P (Signed)
Date: 06/24/2014               Patient Name:  [name] MRN: [id number]  DOB: 1966/07/16 Age / Sex: 48 y.o., female   PCP: Courtney Paris, MD         Medical Service: Internal Medicine Teaching Service         Attending Physician: Dr. Cyndie Chime    First Contact: Dr. Beckie Salts Pager: [phone]  Second Contact: Dr. Burtis Junes Pager: [phone]       After Hours (After 5p/  First Contact Pager: [phone]  weekends / holidays): Second Contact Pager: [phone]   Chief Complaint: not feeling right  History of Present Illness: Ms. Beal is a 48 y.o. 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 Pt states she remembers this and "just feels bad." She states that she started feeling like her blood pressure was low. She states she got sweaty, dizzy and weak. She says she has had a decrease in appetite for the past few months, but she has stayed well hydrated. She also reports diarrhea for one day. She has been taking all of her medications as prescribed and denies any use of alcohol or illicit drugs. Pt denies fever, chills, chest pain, shortness of breath, abdominal pain.   Meds: No current facility-administered medications for this encounter.   Current Outpatient Prescriptions  Medication Sig Dispense Refill  . amLODipine (NORVASC) 5 MG tablet Take 1 tablet (5 mg total) by mouth daily.  30 tablet  3  . aspirin 325 MG tablet Take 1 tablet (325 mg total) by mouth daily.  30 tablet  5  . baclofen (LIORESAL) 10 MG tablet Take 0.5-1 tablets (5-10 mg total) by mouth 3 (three) times daily.  90 tablet  3  . carbamazepine (TEGRETOL) 200 MG tablet Take 200 mg by mouth 2 (two) times daily.      . cloNIDine (CATAPRES)  0.2 MG tablet Take 0.2 mg by mouth 2 (two) times daily.      . cyclobenzaprine (FLEXERIL) 10 MG tablet Take 10 mg by mouth 3 (three) times daily as needed for muscle spasms.      . diclofenac sodium (VOLTAREN) 1 % GEL Apply 2 g topically 4 (four) times daily as needed (pain).  3 Tube  5  . digoxin (LANOXIN) 0.125 MG tablet Take 0.125 mg by mouth daily.      . diphenhydrAMINE-zinc acetate (BENADRYL) cream Apply topically 3 (three) times daily as needed for itching.  28.3 g  0  . fluticasone (CUTIVATE) 0.05 % cream Apply topically 2 (two) times daily. Dispense two tubes  30 g  0  . furosemide (LASIX) 40 MG tablet Take 40 mg by mouth.      . gabapentin (NEURONTIN) 300 MG capsule Take 1 capsule (300 mg total) by mouth 3 (three) times daily. Day 1: 300 mg, Day 2: 300 mg twice daily, Day 3: 300 mg 3 times daily  60 capsule  0  . hydrALAZINE (APRESOLINE) 100 MG tablet Take 100 mg by mouth 3 (three) times daily.      [name] ibuprofen (ADVIL,MOTRIN) 200 MG tablet Take 400 mg by mouth 3 (three) times daily as needed for mild pain.      [name]  insulin glargine (LANTUS) 100 UNIT/ML injection Inject 0.1-0.3 mLs (10-30 Units total) into the skin 2 (two) times daily. Take 10 U in the AM, and 35 U in the PM. Dx Code: 250.62.  10 mL  5  . metFORMIN (GLUCOPHAGE) 1000 MG tablet Take 1 tablet (1,000 mg total) by mouth daily with breakfast.  30 tablet  11  . metoprolol succinate (TOPROL-XL) 25 MG 24 hr tablet Take 50 mg (2 tablets) in the morning and 25 mg (1 tablet) in the evening.  90 tablet  3  . oxyCODONE-acetaminophen (PERCOCET) 10-325 MG per tablet Take 1 tablet by mouth every 8 (eight) hours as needed for pain.  90 tablet  0  . pantoprazole (PROTONIX) 40 MG tablet Take 40 mg by mouth daily.      . simvastatin (ZOCOR) 40 MG tablet Take 40 mg by mouth daily.        Allergies: Allergies as of 06/24/2014 - Review Complete 06/24/2014  Allergen Reaction Noted  . Clindamycin Anaphylaxis and Swelling   . Enalapril maleate  Swelling   . Lisinopril Swelling    Past Medical History  Diagnosis Date  . Hypertension     Poorly controlled. Has had HTN since age 89. Angioedema with ACEI.  24 Hr urine and renal arterial dopplers ordered . . . Never done  . Type II or unspecified type diabetes mellitus without mention of complication, uncontrolled DX: 2002  . NICM (nonischemic cardiomyopathy)     EF 45-50% in 8/12, cath 6/12 showed normal coronaries, EF 50-55% by LV gram  . Morbid obesity   . Allergic rhinitis   . HLD (hyperlipidemia)   . OSA on CPAP     sleep study in 8/12 showed moderate to severe OSA requiring CPAP  . Chronic lower back pain     secondary to DJD, obsetiy, hip problems. Followed by Dr. Ivory Broad (pain management)  . 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 Chronic diastolic heart failure      Primarily diastolic CHF: Likely due to uncontrolled HTN. Last echo (8/12) with EF 45-50%, mild to moderate LVH with some asymmetric septal hypertrophy, RV normal size and systolic function. EF 50-55% by LV-gram in 6/12.   . Degeneration of lumbar or lumbosacral intervertebral disc   . Polyneuropathy in diabetes(357.2)   . Thoracic or lumbosacral neuritis or radiculitis, unspecified   . Calcifying tendinitis of shoulder   . LBBB (left bundle branch block)   . Chronic combined systolic and diastolic CHF (congestive heart failure)     EF 40-45% by echo 12/06/2012  . Coronary artery disease     questionable. LHC 05/2011 showing normal coronaries // Followed at [facility], Dr. Shirlee Latch  . GERD (gastroesophageal reflux disease)    Past Surgical History  Procedure Laterality Date  . Breast biopsies Bilateral 2011    patient reports benign results  . Tubal ligation  05/31/1985  . Hand surgery Left   . Shoulder surgery Right   . Dental surgery      tumors removed   . Cardiac catheterization  05/2011  . Pacemaker/defibrillator placed  08/22/13 Left 08/22/13  . Tee without  cardioversion N/A 01/14/2014    Procedure: TRANSESOPHAGEAL ECHOCARDIOGRAM (TEE);  Surgeon: Thurmon Fair, MD;  Location: [facility] ENDOSCOPY;  Service: Cardiovascular;  Laterality: N/A;   Family History  Problem Relation Age of Onset  . Heart disease Mother 68    Died of MI at age 29 yo  .  Heart disease Paternal Grandmother     requiring pacemaker.  [name] Heart disease Paternal Grandfather 71    Died of MI at possibly age 73-53yo  . Stroke Paternal Grandfather   . Heart disease Father 22    MI age 79yo requiring stenting  . Kidney disease Mother     requiring dialysis  . Congestive Heart Failure Mother   . Diabetes Father   . Diabetes Brother   . Heart disease Brother 73    MI at age 70 years old  . Breast cancer Paternal Aunt   . Breast cancer Maternal Grandmother    History   Social History  . Marital Status: Single    Spouse Name: N/A    Number of Children: 2  . Years of Education: 9th grade   Occupational History  . Unemployed     planning on getting disability   Social History Main Topics  . Smoking status: Never Smoker   . Smokeless tobacco: Never Used  . Alcohol Use: No  . Drug Use: No  . Sexual Activity: Yes    Birth Control/ Protection: Surgical   Other Topics Concern  . Not on file   Social History Narrative   Lives in [place] with her son. Is able to read and write fluently in [place].    Review of Systems: General: Admits to diaphoresis, appetite change and fatigue.  Denies fever, chills,  Respiratory: Denies SOB, DOE, cough, chest tightness, and wheezing.   Cardiovascular: Denies chest pain and palpitations.  Gastrointestinal: Admits to nausea and diarrhea, but denies vomiting, abdominal pain, constipation, blood in stool and abdominal distention.  Genitourinary: Denies dysuria, urgency, frequency, hematuria, and flank pain. Endocrine: Denies hot or cold intolerance, polyuria, and polydipsia. Musculoskeletal: Denies myalgias, back pain, joint swelling,  arthralgias and gait problem.  Skin: Denies pallor, rash and wounds.  Neurological: Admits to dizziness, weakness, lightheadedness, Denies seizures, syncope, numbness and headaches.  Psychiatric/Behavioral: Denies mood changes, confusion, nervousness, sleep disturbance and agitation.  Physical Exam: Filed Vitals:   06/24/14 0315 06/24/14 0320 06/24/14 0330 06/24/14 0345  BP: 104/56  112/68 114/68  Pulse: 103 101 101 99  Temp:      TempSrc:      Resp: 15 12 14 15   Height:      Weight:      SpO2: 95% 96% 96% 97%   General: Vital signs reviewed.  Patient is a well-developed and well-nourished, in no acute distress and cooperative with exam.  Head: Normocephalic and atraumatic. Eyes: PERRL, EOMI, conjunctivae normal, No scleral icterus.  Neck: Supple, trachea midline, normal ROM, No JVD, masses, thyromegaly, or carotid bruit present.  Cardiovascular: RRR, S1 normal, S2 normal, no murmurs, gallops, or rubs. Pulmonary/Chest: Clear to auscultation bilaterally, no wheezes, rales, or rhonchi. Abdominal: Soft, non-tender, non-distended, BS +, no masses, organomegaly, or guarding present.  Musculoskeletal: No joint deformities, erythema, or stiffness, ROM full and nontender. Extremities: No swelling or edema,  pulses symmetric and intact bilaterally. No cyanosis or clubbing. Neurological: A&O x3, Strength is 1/5 in the left upper extremity and left lower extremity. Right upper and lower extremity are a 5/5. Cranial nerve II-XII are grossly intact, no focal motor deficit, sensory intact to light touch bilaterally.  Skin: Hyperpigmentation and rash under left arm. Warm, dry and intact.  Psychiatric: Normal mood and affect. speech and behavior is normal. Cognition and memory are normal.   Lab results: Basic Metabolic Panel:  Recent Labs  0148  NA 140  K 3.6*  CL 99  CO2 24  GLUCOSE 216*  BUN 9  CREATININE 0.66  CALCIUM 8.9   Liver Function Tests:  Recent Labs   06/24/14 0148  AST 14  ALT 13  ALKPHOS 77  BILITOT 0.5  PROT 7.1  ALBUMIN 3.7    Recent Labs  06/24/14 0148  LIPASE 13   CBC:  Recent Labs  06/24/14 0148  WBC 11.1*  NEUTROABS 8.0*  HGB 11.4*  HCT 34.8*  MCV 84.3  PLT 218   CBG:  Recent Labs  06/24/14 0112  GLUCAP 217*    Urinalysis:  Recent Labs  06/24/14 0330  COLORURINE ORANGE*  LABSPEC 1.039*  PHURINE 5.0  GLUCOSEU NEGATIVE  HGBUR LARGE*  BILIRUBINUR SMALL*  KETONESUR 15*  PROTEINUR 100*  UROBILINOGEN 1.0  NITRITE POSITIVE*  LEUKOCYTESUR SMALL*    Assessment & Plan by Problem:  UTI: Pt denies any urinary symptoms-denies dysuria, urgency or frequency, flank pain, fevers or chills. Does admit to feeling dizzy and lightheaded. Afebrile 97.6 BP 136/89, HR 113, RR 16, Pulse ox 100% on 2 L oxygen via [place]. WBC 11.1 Lactic Acid 2.41. Pt did drop her blood pressure from her normal systolic 160's to 110's. She was given fluids in the ED. Urinalysis shows orange colored and turbid urine with small leukocytes, positive nitrites. Received ceftriaxone 1 gram in the ED. -Urine culture pending -Bactrim 800/160 mg 1 tab po daily -zofran prn nausea -EKG -Cardiac diet -I/Os  Diarrhea: Complains of diarrhea x 1. Vitals signs--Afebrile 97.6 BP 136/89, HR 113, RR 16, Pulse ox 100% on 2 L oxygen via [place]. WBC 11.1 Lactic Acid 2.41. Loss of volume could be contributing to her hypotension and symptoms of feeling weak and lightheaded. Diarrhea could be 2/2 to UTI as well.  -Stool PCR -I/Os  HTN: BP on admission 136/89 down to 104/56 in the ED. Renal artery duplex in 04/28/12 showed NO renal artery stenosis. Pt on amlodipine 5 mg daily, clonidine 0.2 mg BID, hydralazine 100 mg TID, metoprolol succinate 25 mg 2 tabs in the morning and 1 at night.  -Hold medications for now due to decreased blood pressure  Type II DM with neuropathy: Glucose 216 on admission. Last hemoglobin A1c 12.1 on 03/25/14. Pt on metformin 1000 mg daily,  Lantus 10 units in the morning and 35 units at bedtime, and gabapentin 300 mg TID. -Moderate SSI, Novolog 0-15 units TID with meals  Non-ischemic cardiomyopathy with chronic diastolic and systolic heart failure: S/p pacemaker. Most recent echocardiogram in 01/14/14 shows EF of 35-40%. Patient denies any chest pain, shortness of breath or palpitations. On amlodipine 5 mg daily, clonidine 0.2 mg BID, hydralazine 100 mg TID, metoprolol succinate 25 mg 2 tabs in the morning and 1 at night, simvastatin 40 mg daily, digoxin 0.125 mg daily, asa 325 mg daily and furosemide 40 mg daily. -Continue home medications, but hold BP medications and furosemide for hypotension -Cardiac diet -EKG  HLD: Most recent lipid panel on 01/10/14 shows cholesterol 187, TG 194, HDL 43, LDL 105. On simvastatin 40 mg daily at home. -Continue simvastatin 40 mg daily  CAD: Questionable. LHC 05/2011 showing normal coronaries. Pt is followed at [facility], Dr. Shirlee Latch. Pt on ASA, metoprolol and simvastatin at home. -Continue ASA and statin   OSA: On CPAP at home -Continue CPAP  H/o of CVA: CVA in January of 2015. Pt has residual left sided weakness in upper and lower extremities. 1/5 on exam. 5/5 on the right side. No focal deficits. CN II-XII  grossly intact. On amlodipine 5 mg daily, clonidine 0.2 mg BID, hydralazine 100 mg TID, metoprolol succinate 25 mg 2 tabs in the morning and 1 at night, simvastatin 40 mg daily, asa 325 mg daily  -Continue home medications, but hold BP medications for hypotension  DVT/PE ppx: Lovenox 40 mg SQ daily.   Dispo: Disposition is deferred at this time, awaiting improvement of current medical problems. Anticipated discharge in approximately 1-2 day(s).   The patient does have a current PCP Courtney Paris, MD) and does not need an [facility] hospital follow-up appointment after discharge.  The patient does not have transportation limitations that hinder transportation to clinic  appointments.  Signed: Jill Alexanders, DO PGY-1 Internal Medicine Resident Pager # [phone] 06/24/2014 6:16 AM

## 2014-06-24 NOTE — Progress Notes (Signed)
Subjective: Patient is feeling better today. Denies fever, chills, abdominal pain, dysuria, and hematuria.  Objective: Vital signs in last 24 hours: Filed Vitals:   06/24/14 0615 06/24/14 0630 06/24/14 0645 06/24/14 0742  BP: 141/86 155/97 149/102 151/105  Pulse: 101 94 95 98  Temp:    98.2 F (36.8 C)  TempSrc:    Oral  Resp: 13 14 13 16   Height:    5\' 9"  (1.753 m)  Weight:    280 lb (127.007 kg)  SpO2: 99% 100% 100% 97%   Weight change:   Intake/Output Summary (Last 24 hours) at 06/24/14 1036 Last data filed at 06/24/14 0519  Gross per 24 hour  Intake   1050 ml  Output      0 ml  Net   1050 ml   Physical Exam General: NAD, cooperative HEENT: [place]/AT, EOMI, PERRL, sclera anicteric, moist mucus membranes Neck: supple, no JVD, no carotid bruits CV: RRR, normal S1/S2, no m/g/r Pulm: CTA bilaterally, no wheezes, rhonchi, crackles Abd: BS+, soft, nondistended, nontender, no organomegaly Ext: warm, moves all, no edema Msk: no joint deformities, ROM full and nontender  Lab Results: Basic Metabolic Panel:  Recent Labs Lab 06/24/14 0148  NA 140  K 3.6*  CL 99  CO2 24  GLUCOSE 216*  BUN 9  CREATININE 0.66  CALCIUM 8.9   Liver Function Tests:  Recent Labs Lab 06/24/14 0148  AST 14  ALT 13  ALKPHOS 77  BILITOT 0.5  PROT 7.1  ALBUMIN 3.7    Recent Labs Lab 06/24/14 0148  LIPASE 13   No results found for this basename: AMMONIA,  in the last 168 hours CBC:  Recent Labs Lab 06/24/14 0148  WBC 11.1*  NEUTROABS 8.0*  HGB 11.4*  HCT 34.8*  MCV 84.3  PLT 218   Cardiac Enzymes: No results found for this basename: CKTOTAL, CKMB, CKMBINDEX, TROPONINI,  in the last 168 hours BNP: No results found for this basename: PROBNP,  in the last 168 hours D-Dimer: No results found for this basename: DDIMER,  in the last 168 hours CBG:  Recent Labs Lab 06/24/14 0112 06/24/14 0812  GLUCAP 217* 167*   Hemoglobin A1C: No results found for this basename:  HGBA1C,  in the last 168 hours Fasting Lipid Panel: No results found for this basename: CHOL, HDL, LDLCALC, TRIG, CHOLHDL, LDLDIRECT,  in the last 168 hours Thyroid Function Tests: No results found for this basename: TSH, T4TOTAL, FREET4, T3FREE, THYROIDAB,  in the last 168 hours Coagulation: No results found for this basename: LABPROT, INR,  in the last 168 hours Anemia Panel: No results found for this basename: VITAMINB12, FOLATE, FERRITIN, TIBC, IRON, RETICCTPCT,  in the last 168 hours Urine Drug Screen: Drugs of Abuse     Component Value Date/Time   LABOPIA NONE DETECTED 01/09/2014 2030   COCAINSCRNUR NONE DETECTED 01/09/2014 2030   LABBENZ NONE DETECTED 01/09/2014 2030   AMPHETMU NONE DETECTED 01/09/2014 2030   THCU NONE DETECTED 01/09/2014 2030   LABBARB NONE DETECTED 01/09/2014 2030    Alcohol Level: No results found for this basename: ETH,  in the last 168 hours Urinalysis:  Recent Labs Lab 06/24/14 0330  COLORURINE ORANGE*  LABSPEC 1.039*  PHURINE 5.0  GLUCOSEU NEGATIVE  HGBUR LARGE*  BILIRUBINUR SMALL*  KETONESUR 15*  PROTEINUR 100*  UROBILINOGEN 1.0  NITRITE POSITIVE*  LEUKOCYTESUR SMALL*    Micro Results: No results found for this or any previous visit (from the past 240 hour(s)). Studies/Results: No  results found. Medications: I have reviewed the patient's current medications. Scheduled Meds: . amLODipine  5 mg Oral Daily  . aspirin  325 mg Oral Daily  . baclofen  5 mg Oral TID  . carbamazepine  200 mg Oral BID  . cloNIDine  0.2 mg Oral BID  . digoxin  0.125 mg Oral Daily  . enoxaparin (LOVENOX) injection  40 mg Subcutaneous Q24H  . gabapentin  300 mg Oral TID  . hydrALAZINE  100 mg Oral TID  . insulin aspart  0-15 Units Subcutaneous TID WC  . metoprolol succinate  25 mg Oral QPM  . metoprolol succinate  50 mg Oral q morning - 10a  . nystatin   Topical BID  . pantoprazole  40 mg Oral Daily  . simvastatin  40 mg Oral Daily  . [START ON 06/25/2014]  sulfamethoxazole-trimethoprim  1 tablet Oral Q12H   Continuous Infusions:  PRN Meds:.cyclobenzaprine, ondansetron (ZOFRAN) IV, ondansetron, oxyCODONE, oxyCODONE-acetaminophen Assessment/Plan: Ms. Northrop is a 48yo woman w/ PMHx of HTN, DM type 2, non-ischemic cardiomyopathy w/ chronic diastolic and systolic heart failure, morbid obesity, HLD, CAD, and h/o of CVA who presented to the ED for dizziness and weakness.  1. UTI: Pt denies any urinary symptoms-denies dysuria, urgency or frequency, flank pain, fevers or chills. Does admit to feeling dizzy and lightheaded. On admission, afebrile 97.6 BP 136/89, HR 113, RR 16, Pulse ox 100% on 2 L oxygen via [place]. WBC 11.1 Lactic Acid 2.41. Pt did drop her blood pressure from her normal systolic 160's to 110's. She was given fluids in the ED. Urinalysis showed orange colored and turbid urine with small leukocytes, positive nitrites. Received ceftriaxone 1 gram in the ED.  - Urine culture pending  - Continue Bactrim 800-160mg  1 tab PO BID as outpatient - Continue Zofran prn nausea  - I/Os   2. Diarrhea- Resolved: Complains of diarrhea x 1 last night.  WBC 11.1 Lactic Acid 2.41. Loss of volume could be contributing to her hypotension and symptoms of feeling weak and lightheaded. Diarrhea could be 2/2 to UTI as well. Patient not complaining of diarrhea this AM.   3. HTN: BP on admission 136/89 down to 104/56 in the ED. Renal artery duplex in 04/28/12 showed NO renal artery stenosis. Pt on amlodipine 5 mg daily, clonidine 0.2 mg BID, hydralazine 100 mg TID, metoprolol succinate 25 mg 2 tabs in the morning and 1 at night. BP now 140-150s/90-100s. - Start back on home meds  4. Type II DM with neuropathy: Glucose 216 on admission. Last hemoglobin A1c 12.1 on 03/25/14. Pt on metformin 1000 mg daily, Lantus 10 units in the morning and 35 units at bedtime, and gabapentin 300 mg TID.  - Moderate SSI, Novolog 0-15 units TID with meals  - On discharge, place back on  Metformin  5. Non-ischemic cardiomyopathy with chronic diastolic and systolic heart failure: S/p pacemaker. Most recent echocardiogram in 01/14/14 shows EF of 35-40%. Patient denies any chest pain, shortness of breath or palpitations. On amlodipine 5 mg daily, clonidine 0.2 mg BID, hydralazine 100 mg TID, metoprolol succinate 25 mg 2 tabs in the morning and 1 at night, simvastatin 40 mg daily, digoxin 0.125 mg daily, asa 325 mg daily and furosemide 40 mg daily.  -Continue home medications  6. HLD: Most recent lipid panel on 01/10/14 shows cholesterol 187, TG 194, HDL 43, LDL 105. On simvastatin 40 mg daily at home.  - Continue simvastatin 40 mg daily   7. CAD: Questionable. LHC 05/2011  showing normal coronaries. Pt is followed at [facility], Dr. Shirlee Latch. Pt on ASA, metoprolol and simvastatin at home.  - Continue ASA and statin   8. OSA: On CPAP at home  - Continue CPAP   9. H/o of CVA: CVA in January of 2015. Pt has residual left sided weakness in upper and lower extremities. 1/5 on exam. 5/5 on the right side. No focal deficits. CN II-XII grossly intact. On amlodipine 5 mg daily, clonidine 0.2 mg BID, hydralazine 100 mg TID, metoprolol succinate 25 mg 2 tabs in the morning and 1 at night, simvastatin 40 mg daily, asa 325 mg daily  - Continue home medications  Diet: Heart healthy DVT/PE ppx: Lovenox 40 mg SQ daily.  Dispo: Disposition is deferred at this time, awaiting improvement of current medical problems. Anticipated discharge in approximately 1-2 day(s).     The patient does have a current PCP Courtney Paris, MD) and does not need an [facility] hospital follow-up appointment after discharge.  The patient does not have transportation limitations that hinder transportation to clinic appointments.  .Services Needed at time of discharge: Y = Yes, Blank = No PT:   OT:   RN:   Equipment:   Other:     LOS: 0 days   Rich Number, MD 06/24/2014, 10:36 AM

## 2014-06-24 NOTE — Progress Notes (Signed)
Patient was discharged home by MD order; discharged instructions  review and give to patient and her son with care notes; IV DIC; skin intact; patient will be escorted to the car by volunteer via wheelchair.

## 2014-06-24 NOTE — ED Notes (Signed)
MD at bedside. Admitting physician at bedside.

## 2014-06-24 NOTE — Discharge Summary (Signed)
Attending Discharge Note: This patient was seen and examined by me on the day of discharge which was the same day as admission. Please see attending admission note.

## 2014-06-25 ENCOUNTER — Ambulatory Visit: Payer: Medicaid Other

## 2014-06-25 ENCOUNTER — Encounter: Payer: Medicaid Other | Admitting: Occupational Therapy

## 2014-06-25 DIAGNOSIS — IMO0001 Reserved for inherently not codable concepts without codable children: Secondary | ICD-10-CM | POA: Diagnosis not present

## 2014-06-25 NOTE — Telephone Encounter (Signed)
Pt admitted to hospital.  meds should be refilled at d/c

## 2014-06-26 LAB — URINE CULTURE: Colony Count: >=100000

## 2014-06-27 ENCOUNTER — Telehealth: Payer: Self-pay | Admitting: *Deleted

## 2014-06-27 NOTE — Telephone Encounter (Signed)
Pt was discharged from hospital by Dr Beckie Salts on 7/13. Med request for Citalopram 20 mg last given 06/05/14 and Proair inhaler were requested by pharmacy. Meds are not on Med list.   Should pt be on these 2 meds?

## 2014-06-28 NOTE — Telephone Encounter (Signed)
Unaware that patient was taking these medications during her hospitalization. Will forward to PCP to address.

## 2014-07-01 MED ORDER — CITALOPRAM HYDROBROMIDE 20 MG PO TABS: 20.0000 mg | ORAL_TABLET | Freq: Every day | ORAL | Status: DC

## 2014-07-01 NOTE — Telephone Encounter (Signed)
Pt next visit 8/11 in clinic. Can this wait until then? request from pharmacy.  No response from Dr Yetta Barre - on night float.

## 2014-07-01 NOTE — Telephone Encounter (Signed)
Pt does have dx of Depression and was on Celexa per Dr Yetta Barre. Pharm tech removed from med list in Jan. I refilled it.  I will not fill albuterol as pt has no pul dx on med list to support the need for the med. Defer to PCP.

## 2014-07-02 ENCOUNTER — Ambulatory Visit: Payer: Medicaid Other | Admitting: Physical Therapy

## 2014-07-02 DIAGNOSIS — IMO0001 Reserved for inherently not codable concepts without codable children: Secondary | ICD-10-CM | POA: Diagnosis not present

## 2014-07-03 ENCOUNTER — Ambulatory Visit (INDEPENDENT_AMBULATORY_CARE_PROVIDER_SITE_OTHER): Payer: Medicaid Other | Admitting: Internal Medicine

## 2014-07-03 ENCOUNTER — Encounter: Payer: Self-pay | Admitting: Internal Medicine

## 2014-07-03 VITALS — BP 116/75 | HR 92 | Temp 97.8°F | Ht 69.0 in | Wt 264.2 lb

## 2014-07-03 DIAGNOSIS — E1149 Type 2 diabetes mellitus with other diabetic neurological complication: Secondary | ICD-10-CM

## 2014-07-03 DIAGNOSIS — R109 Unspecified abdominal pain: Secondary | ICD-10-CM

## 2014-07-03 DIAGNOSIS — E1165 Type 2 diabetes mellitus with hyperglycemia: Secondary | ICD-10-CM

## 2014-07-03 DIAGNOSIS — I10 Essential (primary) hypertension: Secondary | ICD-10-CM

## 2014-07-03 DIAGNOSIS — E1140 Type 2 diabetes mellitus with diabetic neuropathy, unspecified: Secondary | ICD-10-CM

## 2014-07-03 DIAGNOSIS — N390 Urinary tract infection, site not specified: Secondary | ICD-10-CM

## 2014-07-03 DIAGNOSIS — Z8673 Personal history of transient ischemic attack (TIA), and cerebral infarction without residual deficits: Secondary | ICD-10-CM

## 2014-07-03 DIAGNOSIS — IMO0002 Reserved for concepts with insufficient information to code with codable children: Secondary | ICD-10-CM

## 2014-07-03 DIAGNOSIS — E1142 Type 2 diabetes mellitus with diabetic polyneuropathy: Secondary | ICD-10-CM

## 2014-07-03 LAB — POCT GLYCOSYLATED HEMOGLOBIN (HGB A1C): HEMOGLOBIN A1C: 8.3

## 2014-07-03 LAB — GLUCOSE, CAPILLARY: Glucose-Capillary: 170 mg/dL — ABNORMAL HIGH (ref 70–99)

## 2014-07-03 MED ORDER — PROMETHAZINE HCL 12.5 MG PO TABS: 12.5000 mg | ORAL_TABLET | Freq: Four times a day (QID) | ORAL | Status: DC | PRN

## 2014-07-03 NOTE — Patient Instructions (Addendum)
General Instructions: Please follow up in 3 months, sooner if needed I will let you know about your labs Take phenerghan for nausea as needed  Return to clinic in 1 week if not feeling better otherwise as scheduled with Dr. Yetta Barre    Treatment Goals:  Goals (1 Years of Data) as of 07/03/14         As of Today 06/24/14 06/24/14 06/24/14 06/24/14     Blood Pressure    . Blood Pressure < 140/90  116/75 139/84 151/105 149/102 155/97     Result Component    . HEMOGLOBIN A1C < 7.0  8.3          Progress Toward Treatment Goals:  Treatment Goal 07/03/2014  Hemoglobin A1C improved  Blood pressure at goal  Prevent falls -    Self Care Goals & Plans:  Self Care Goal 07/03/2014  Manage my medications take my medicines as prescribed; bring my medications to every visit; refill my medications on time  Monitor my health keep track of my blood glucose; bring my glucose meter and log to each visit; keep track of my blood pressure; bring my blood pressure log to each visit; keep track of my weight; check my feet daily  Eat healthy foods drink diet soda or water instead of juice or soda; eat more vegetables; eat foods that are low in salt; eat baked foods instead of fried foods; eat fruit for snacks and desserts; eat smaller portions  Be physically active find an activity I enjoy  Meeting treatment goals maintain the current self-care plan    Home Blood Glucose Monitoring 07/03/2014  Check my blood sugar 3 times a day  When to check my blood sugar before meals     Care Management & Community Referrals:  Referral 07/03/2014  Referrals made for care management support -  Referrals made to community resources none     Sulfamethoxazole; Trimethoprim, SMX-TMP tablets What is this medicine? SULFAMETHOXAZOLE; TRIMETHOPRIM or SMX-TMP (suhl fuh meth OK suh zohl; trye METH oh prim) is a combination of a sulfonamide antibiotic and a second antibiotic, trimethoprim. It is used to treat or prevent certain  kinds of bacterial infections. It will not work for colds, flu, or other viral infections. This medicine may be used for other purposes; ask your health care provider or pharmacist if you have questions. COMMON BRAND NAME(S): Bacter-Aid DS, Bactrim, Bactrim DS, Septra, Septra DS What should I tell my health care provider before I take this medicine? They need to know if you have any of these conditions: -anemia -asthma -being treated with anticonvulsants -if you frequently drink alcohol containing drinks -kidney disease -liver disease -low level of folic acid or [id number] dehydrogenase -poor nutrition or malabsorption -porphyria -severe allergies -thyroid disorder -an unusual or allergic reaction to sulfamethoxazole, trimethoprim, sulfa drugs, other medicines, foods, dyes, or preservatives -pregnant or trying to get pregnant -breast-feeding How should I use this medicine? Take this medicine by mouth with a full glass of water. Follow the directions on the prescription label. Take your medicine at regular intervals. Do not take it more often than directed. Do not skip doses or stop your medicine early. Talk to your pediatrician regarding the use of this medicine in children. Special care may be needed. This medicine has been used in children as young as 61 months of age. Overdosage: If you think you have taken too much of this medicine contact a poison control center or emergency room at once. NOTE: This medicine is  only for you. Do not share this medicine with others. What if I miss a dose? If you miss a dose, take it as soon as you can. If it is almost time for your next dose, take only that dose. Do not take double or extra doses. What may interact with this medicine? Do not take this medicine with any of the following medications: -aminobenzoate potassium -dofetilide -metronidazole This medicine may also interact with the following medications: -ACE inhibitors like  benazepril, enalapril, lisinopril, and ramipril -birth control pills -cyclosporine -digoxin -diuretics -indomethacin -medicines for diabetes -methenamine -methotrexate -phenytoin -potassium supplements -pyrimethamine -sulfinpyrazone -tricyclic antidepressants -warfarin This list may not describe all possible interactions. Give your health care provider a list of all the medicines, herbs, non-prescription drugs, or dietary supplements you use. Also tell them if you smoke, drink alcohol, or use illegal drugs. Some items may interact with your medicine. What should I watch for while using this medicine? Tell your doctor or health care professional if your symptoms do not improve. Drink several glasses of water a day to reduce the risk of kidney problems. Do not treat diarrhea with over the counter products. Contact your doctor if you have diarrhea that lasts more than 2 days or if it is severe and watery. This medicine can make you more sensitive to the sun. Keep out of the sun. If you cannot avoid being in the sun, wear protective clothing and use a sunscreen. Do not use sun lamps or tanning beds/booths. What side effects may I notice from receiving this medicine? Side effects that you should report to your doctor or health care professional as soon as possible: -allergic reactions like skin rash or hives, swelling of the face, lips, or tongue -breathing problems -fever or chills, sore throat -irregular heartbeat, chest pain -joint or muscle pain -pain or difficulty passing urine -red pinpoint spots on skin -redness, blistering, peeling or loosening of the skin, including inside the mouth -unusual bleeding or bruising -unusually weak or tired -yellowing of the eyes or skin Side effects that usually do not require medical attention (report to your doctor or health care professional if they continue or are bothersome): -diarrhea -dizziness -headache -loss of appetite -nausea,  vomiting -nervousness This list may not describe all possible side effects. Call your doctor for medical advice about side effects. You may report side effects to FDA at 1-800-FDA-1088. Where should I keep my medicine? Keep out of the reach of children. Store at room temperature between 20 to 25 degrees C (68 to 77 degrees F). Protect from light. Throw away any unused medicine after the expiration date. NOTE: This sheet is a summary. It may not cover all possible information. If you have questions about this medicine, talk to your doctor, pharmacist, or health care provider.  2015, [employer] Standard. (2013-07-06 14:38:26)   Promethazine tablets What is this medicine? PROMETHAZINE (proe METH a zeen) is an antihistamine. It is used to treat allergic reactions and to treat or prevent nausea and vomiting from illness or motion sickness. It is also used to make you sleep before surgery, and to help treat pain or nausea after surgery. This medicine may be used for other purposes; ask your health care provider or pharmacist if you have questions. COMMON BRAND NAME(S): Phenergan What should I tell my health care provider before I take this medicine? They need to know if you have any of these conditions: -glaucoma -high blood pressure or heart disease -kidney disease -liver disease -lung or breathing disease,  like asthma -prostate trouble -pain or difficulty passing urine -seizures -an unusual or allergic reaction to promethazine or phenothiazines, other medicines, foods, dyes, or preservatives -pregnant or trying to get pregnant -breast-feeding How should I use this medicine? Take this medicine by mouth with a glass of water. Follow the directions on the prescription label. Take your doses at regular intervals. Do not take your medicine more often than directed. Talk to your pediatrician regarding the use of this medicine in children. Special care may be needed. This medicine should not  be given to infants and children younger than 22 years old. Overdosage: If you think you have taken too much of this medicine contact a poison control center or emergency room at once. NOTE: This medicine is only for you. Do not share this medicine with others. What if I miss a dose? If you miss a dose, take it as soon as you can. If it is almost time for your next dose, take only that dose. Do not take double or extra doses. What may interact with this medicine? Do not take this medicine with any of the following medications: -cisapride -dofetilide -dronedarone -MAOIs like Carbex, Eldepryl, Marplan, Nardil, Parnate -pimozide -quinidine, including dextromethorphan; quinidine -thioridazine -ziprasidone This medicine may also interact with the following medications: -certain medicines for depression, anxiety, or psychotic disturbances -certain medicines for anxiety or sleep -certain medicines for seizures like carbamazepine, phenobarbital, phenytoin -certain medicines for movement abnormalities as in Parkinson's disease, or for gastrointestinal problems -epinephrine -medicines for allergies or colds -muscle relaxants -narcotic medicines for pain -other medicines that prolong the QT interval (cause an abnormal heart rhythm) -tramadol -trimethobenzamide This list may not describe all possible interactions. Give your health care provider a list of all the medicines, herbs, non-prescription drugs, or dietary supplements you use. Also tell them if you smoke, drink alcohol, or use illegal drugs. Some items may interact with your medicine. What should I watch for while using this medicine? Tell your doctor or health care professional if your symptoms do not start to get better in 1 to 2 days. You may get drowsy or dizzy. Do not drive, use machinery, or do anything that needs mental alertness until you know how this medicine affects you. To reduce the risk of dizzy or fainting spells, do not stand  or sit up quickly, especially if you are an older patient. Alcohol may increase dizziness and drowsiness. Avoid alcoholic drinks. Your mouth may get dry. Chewing sugarless gum or sucking hard candy, and drinking plenty of water may help. Contact your doctor if the problem does not go away or is severe. This medicine may cause dry eyes and blurred vision. If you wear contact lenses you may feel some discomfort. Lubricating drops may help. See your eye doctor if the problem does not go away or is severe. This medicine can make you more sensitive to the sun. Keep out of the sun. If you cannot avoid being in the sun, wear protective clothing and use sunscreen. Do not use sun lamps or tanning beds/booths. If you are diabetic, check your blood-sugar levels regularly. What side effects may I notice from receiving this medicine? Side effects that you should report to your doctor or health care professional as soon as possible: -blurred vision -irregular heartbeat, palpitations or chest pain -muscle or facial twitches -pain or difficulty passing urine -seizures -skin rash -slowed or shallow breathing -unusual bleeding or bruising -yellowing of the eyes or skin Side effects that usually do not require medical  attention (report to your doctor or health care professional if they continue or are bothersome): -headache -nightmares, agitation, nervousness, excitability, not able to sleep (these are more likely in children) -stuffy nose This list may not describe all possible side effects. Call your doctor for medical advice about side effects. You may report side effects to FDA at 1-800-FDA-1088. Where should I keep my medicine? Keep out of the reach of children. Store at room temperature, between 20 and 25 degrees C (68 and 77 degrees F). Protect from light. Throw away any unused medicine after the expiration date. NOTE: This sheet is a summary. It may not cover all possible information. If you have  questions about this medicine, talk to your doctor, pharmacist, or health care provider.  2015, [employer]/Gold Standard. (2013-07-31 15:04:46)

## 2014-07-03 NOTE — Progress Notes (Signed)
   Subjective:    Patient ID: [name], female    DOB: 1966/07/19, 48 y.o.   MRN: [id number]  HPI Comments: 48 y.o PMH chronic combined HF s/p AICD, stroke, HTN (BP 116/75), DM 2 with neuropathy, HLD, OSA  She presents for HFU: 1. Dx'ed with UTI E.coli pansensitive taking Bactrim at discharge and still has 2-3 more days. She states she doesn't feel good.  She has noticed weakness, nausea, decreased po intake due to nausea associated with Bactrim and also abdominal pain x 2 days epigastric, LUQ region 5-6/10 w/o radiation intermittent lying down makes the pain worse and she tries narcotics for relief.  She denies associated vomiting 2. DM 2 with neuropathy-HA1C 12.1 03/25/14 now 8.3 today.  She did not bring her meter in today per pt cbg reading this am was 186 and later today 212 and other meter readings 100s to 200s.  She reports compliance with meds metformin 1000 mg bid, Lantus 10 units am and 35 pm 3. S/p stroke 12/2013-She still needs more PT and would like OT but only has 2 more visits with PT and needs OT due to left hemiparesis.  F/u with neuro 08/2014   SH: lives w/ daughter age 81, also has son age 6; 5 grandkids.       Review of Systems  Gastrointestinal: Positive for nausea and abdominal pain. Negative for vomiting and diarrhea.  Genitourinary: Negative for dysuria.  Neurological: Positive for weakness.       Lightheadedness improved   Psychiatric/Behavioral: Negative for suicidal ideas.       Objective:   Physical Exam  Nursing note and vitals reviewed. Constitutional: She is oriented to person, place, and time. Vital signs are normal. She appears well-developed and well-nourished. She is cooperative. No distress.  HENT:  Head: Normocephalic and atraumatic.  Mouth/Throat: Oropharynx is clear and moist and mucous membranes are normal. No oropharyngeal exudate.  Eyes: Conjunctivae are normal. Pupils are equal, round, and reactive to light. Right eye exhibits no  discharge. Left eye exhibits no discharge. No scleral icterus.  Cardiovascular: Normal rate, regular rhythm, S1 normal, S2 normal and normal heart sounds.   No murmur heard. Trace lower ext edema   Pulmonary/Chest: Effort normal and breath sounds normal. No respiratory distress. She has no wheezes.  Abdominal: Soft. Bowel sounds are normal. There is tenderness.    Neurological: She is alert and oriented to person, place, and time. No sensory deficit. Gait abnormal.  BL walks with walker in wheelchair today Left hemiparesis 0-1/5 left hand and 3-4/5 left leg; leg brace on, left facial droop unchanged since stroke  Skin: Skin is warm and dry. No rash noted. She is not diaphoretic.  Psychiatric: She has a normal mood and affect. Her speech is normal and behavior is normal. Judgment and thought content normal. Cognition and memory are normal.          Assessment & Plan:  1 week prn then as sch with Dr. Yetta Barre

## 2014-07-03 NOTE — Assessment & Plan Note (Addendum)
Lab Results  Component Value Date   HGBA1C 8.3 07/03/2014   HGBA1C 12.1 03/25/2014   HGBA1C 10.7 12/19/2013     Assessment: Diabetes control: fair control Progress toward A1C goal:  improved (pending HA1C) Comments: improved with meds   Plan: Medications:  continue current medications (Metformin 1000 mg bid, Lantus 10 u am and 35 pm) Home glucose monitoring: Frequency: 3 times a day Timing: before meals Instruction/counseling given: reminded to bring blood glucose meter & log to each visit and reminded to bring medications to each visit Educational resources provided: brochure Self management tools provided:   Other plans: f/u with PCP as scheduled

## 2014-07-03 NOTE — Assessment & Plan Note (Signed)
Interested in more PT sessions and OT for left hemiparesis  Will consult social worker and order if social worker can get approved through pts insurance  F/u with Neuro 08/2014

## 2014-07-03 NOTE — Assessment & Plan Note (Signed)
E.coli pan-S on Bactrim will complete 2-3 more days  Feeling nauseated on bactrim with decreased po will try phenerghan prn RTC in 1 week if not improved after stopping medication

## 2014-07-03 NOTE — Assessment & Plan Note (Signed)
Unclear etiology. Associated with n/v/decresaed po could be related to Bactrim use Will check CMET, CBC, lipase to r/o pancreatitis  RTC in 1 week if not better

## 2014-07-04 ENCOUNTER — Encounter: Payer: Self-pay | Admitting: Internal Medicine

## 2014-07-04 ENCOUNTER — Telehealth (HOSPITAL_COMMUNITY): Payer: Self-pay | Admitting: Vascular Surgery

## 2014-07-04 ENCOUNTER — Ambulatory Visit (INDEPENDENT_AMBULATORY_CARE_PROVIDER_SITE_OTHER): Payer: Medicaid Other | Admitting: Internal Medicine

## 2014-07-04 ENCOUNTER — Ambulatory Visit (HOSPITAL_COMMUNITY)
Admission: AD | Admit: 2014-07-04 | Discharge: 2014-07-04 | Disposition: A | Discharge status: Home / Self Care | Payer: Medicaid Other | Source: Ambulatory Visit | Admitting: Internal Medicine

## 2014-07-04 ENCOUNTER — Emergency Department (HOSPITAL_COMMUNITY)
Admission: EM | Admit: 2014-07-04 | Discharge: 2014-07-04 | Discharge status: Absent without leave | Payer: Medicaid Other | Source: Home / Self Care

## 2014-07-04 ENCOUNTER — Telehealth: Payer: Self-pay | Admitting: *Deleted

## 2014-07-04 ENCOUNTER — Encounter (HOSPITAL_COMMUNITY): Payer: Self-pay | Admitting: General Practice

## 2014-07-04 ENCOUNTER — Ambulatory Visit: Payer: Medicaid Other | Admitting: Internal Medicine

## 2014-07-04 ENCOUNTER — Observation Stay (HOSPITAL_COMMUNITY)
Admission: AD | Admit: 2014-07-04 | Discharge: 2014-07-05 | Disposition: A | Discharge status: Home / Self Care | Payer: Medicaid Other | Source: Ambulatory Visit | Attending: Internal Medicine | Admitting: Internal Medicine

## 2014-07-04 VITALS — BP 137/89 | HR 108 | Temp 97.9°F | Ht 69.0 in | Wt 268.9 lb

## 2014-07-04 DIAGNOSIS — G4733 Obstructive sleep apnea (adult) (pediatric): Secondary | ICD-10-CM | POA: Insufficient documentation

## 2014-07-04 DIAGNOSIS — R000 Tachycardia, unspecified: Secondary | ICD-10-CM

## 2014-07-04 DIAGNOSIS — Z79899 Other long term (current) drug therapy: Secondary | ICD-10-CM | POA: Insufficient documentation

## 2014-07-04 DIAGNOSIS — E1142 Type 2 diabetes mellitus with diabetic polyneuropathy: Secondary | ICD-10-CM | POA: Diagnosis not present

## 2014-07-04 DIAGNOSIS — Z95810 Presence of automatic (implantable) cardiac defibrillator: Secondary | ICD-10-CM | POA: Insufficient documentation

## 2014-07-04 DIAGNOSIS — I2510 Atherosclerotic heart disease of native coronary artery without angina pectoris: Secondary | ICD-10-CM | POA: Diagnosis not present

## 2014-07-04 DIAGNOSIS — I509 Heart failure, unspecified: Secondary | ICD-10-CM | POA: Diagnosis not present

## 2014-07-04 DIAGNOSIS — D72829 Elevated white blood cell count, unspecified: Secondary | ICD-10-CM | POA: Diagnosis not present

## 2014-07-04 DIAGNOSIS — B3731 Acute candidiasis of vulva and vagina: Secondary | ICD-10-CM

## 2014-07-04 DIAGNOSIS — Z6838 Body mass index (BMI) 38.0-38.9, adult: Secondary | ICD-10-CM | POA: Diagnosis not present

## 2014-07-04 DIAGNOSIS — K219 Gastro-esophageal reflux disease without esophagitis: Secondary | ICD-10-CM | POA: Diagnosis not present

## 2014-07-04 DIAGNOSIS — E1149 Type 2 diabetes mellitus with other diabetic neurological complication: Secondary | ICD-10-CM | POA: Diagnosis not present

## 2014-07-04 DIAGNOSIS — R29898 Other symptoms and signs involving the musculoskeletal system: Secondary | ICD-10-CM | POA: Insufficient documentation

## 2014-07-04 DIAGNOSIS — R5381 Other malaise: Principal | ICD-10-CM | POA: Insufficient documentation

## 2014-07-04 DIAGNOSIS — I447 Left bundle-branch block, unspecified: Secondary | ICD-10-CM | POA: Diagnosis not present

## 2014-07-04 DIAGNOSIS — I5042 Chronic combined systolic (congestive) and diastolic (congestive) heart failure: Secondary | ICD-10-CM | POA: Diagnosis not present

## 2014-07-04 DIAGNOSIS — R5383 Other fatigue: Principal | ICD-10-CM

## 2014-07-04 DIAGNOSIS — Z794 Long term (current) use of insulin: Secondary | ICD-10-CM | POA: Insufficient documentation

## 2014-07-04 DIAGNOSIS — E785 Hyperlipidemia, unspecified: Secondary | ICD-10-CM | POA: Diagnosis not present

## 2014-07-04 DIAGNOSIS — Z8249 Family history of ischemic heart disease and other diseases of the circulatory system: Secondary | ICD-10-CM | POA: Insufficient documentation

## 2014-07-04 DIAGNOSIS — Z9989 Dependence on other enabling machines and devices: Secondary | ICD-10-CM | POA: Diagnosis not present

## 2014-07-04 DIAGNOSIS — M1610 Unilateral primary osteoarthritis, unspecified hip: Secondary | ICD-10-CM | POA: Diagnosis not present

## 2014-07-04 DIAGNOSIS — I10 Essential (primary) hypertension: Secondary | ICD-10-CM | POA: Diagnosis not present

## 2014-07-04 DIAGNOSIS — J45909 Unspecified asthma, uncomplicated: Secondary | ICD-10-CM | POA: Insufficient documentation

## 2014-07-04 DIAGNOSIS — Z823 Family history of stroke: Secondary | ICD-10-CM | POA: Insufficient documentation

## 2014-07-04 DIAGNOSIS — I5022 Chronic systolic (congestive) heart failure: Secondary | ICD-10-CM

## 2014-07-04 DIAGNOSIS — J309 Allergic rhinitis, unspecified: Secondary | ICD-10-CM | POA: Insufficient documentation

## 2014-07-04 DIAGNOSIS — M169 Osteoarthritis of hip, unspecified: Secondary | ICD-10-CM | POA: Diagnosis not present

## 2014-07-04 DIAGNOSIS — R531 Weakness: Secondary | ICD-10-CM | POA: Diagnosis present

## 2014-07-04 DIAGNOSIS — I5032 Chronic diastolic (congestive) heart failure: Secondary | ICD-10-CM

## 2014-07-04 DIAGNOSIS — Z7982 Long term (current) use of aspirin: Secondary | ICD-10-CM | POA: Insufficient documentation

## 2014-07-04 DIAGNOSIS — B373 Candidiasis of vulva and vagina: Secondary | ICD-10-CM

## 2014-07-04 DIAGNOSIS — I69998 Other sequelae following unspecified cerebrovascular disease: Secondary | ICD-10-CM | POA: Insufficient documentation

## 2014-07-04 DIAGNOSIS — I428 Other cardiomyopathies: Secondary | ICD-10-CM | POA: Diagnosis not present

## 2014-07-04 DIAGNOSIS — M129 Arthropathy, unspecified: Secondary | ICD-10-CM | POA: Insufficient documentation

## 2014-07-04 DIAGNOSIS — E119 Type 2 diabetes mellitus without complications: Secondary | ICD-10-CM

## 2014-07-04 DIAGNOSIS — Z8673 Personal history of transient ischemic attack (TIA), and cerebral infarction without residual deficits: Secondary | ICD-10-CM

## 2014-07-04 HISTORY — DX: Type 2 diabetes mellitus without complications: E11.9

## 2014-07-04 HISTORY — DX: Tachycardia, unspecified: R00.0

## 2014-07-04 HISTORY — DX: Presence of automatic (implantable) cardiac defibrillator: Z95.810

## 2014-07-04 HISTORY — DX: Unspecified osteoarthritis, unspecified site: M19.90

## 2014-07-04 HISTORY — DX: Cerebral infarction, unspecified: I63.9

## 2014-07-04 HISTORY — DX: Urinary tract infection, site not specified: N39.0

## 2014-07-04 LAB — TROPONIN I: Troponin I: <0.3 ng/mL (ref <0.30)

## 2014-07-04 LAB — CBC WITH DIFFERENTIAL/PLATELET
BASOS PCT: 0 % (ref 0–1)
Basophils Absolute: 0 10*3/uL (ref 0.0–0.1)
Eosinophils Absolute: 0.1 10*3/uL (ref 0.0–0.7)
Eosinophils Relative: 1 % (ref 0–5)
HEMATOCRIT: 37.1 % (ref 36.0–46.0)
Hemoglobin: 12.2 g/dL (ref 12.0–15.0)
Lymphocytes Relative: 43 % (ref 12–46)
Lymphs Abs: 3.7 10*3/uL (ref 0.7–4.0)
MCH: 27 pg (ref 26.0–34.0)
MCHC: 32.9 g/dL (ref 30.0–36.0)
MCV: 82.1 fL (ref 78.0–100.0)
MONO ABS: 0.7 10*3/uL (ref 0.1–1.0)
MONOS PCT: 8 % (ref 3–12)
NEUTROS ABS: 4.1 10*3/uL (ref 1.7–7.7)
Neutrophils Relative %: 48 % (ref 43–77)
Platelets: 323 10*3/uL (ref 150–400)
RBC: 4.52 MIL/uL (ref 3.87–5.11)
RDW: 14.5 % (ref 11.5–15.5)
WBC: 8.5 10*3/uL (ref 4.0–10.5)

## 2014-07-04 LAB — HEPATIC FUNCTION PANEL
ALBUMIN: 4.4 g/dL (ref 3.5–5.2)
ALK PHOS: 66 U/L (ref 39–117)
ALT: 11 U/L (ref 0–35)
AST: 12 U/L (ref 0–37)
Bilirubin, Direct: 0.1 mg/dL (ref 0.0–0.3)
Indirect Bilirubin: 0.5 mg/dL (ref 0.2–1.2)
Total Bilirubin: 0.6 mg/dL (ref 0.2–1.2)
Total Protein: 7.5 g/dL (ref 6.0–8.3)

## 2014-07-04 LAB — CBC
HEMATOCRIT: 38.3 % (ref 36.0–46.0)
Hemoglobin: 12.3 g/dL (ref 12.0–15.0)
MCH: 27.8 pg (ref 26.0–34.0)
MCHC: 32.1 g/dL (ref 30.0–36.0)
MCV: 86.5 fL (ref 78.0–100.0)
Platelets: 291 10*3/uL (ref 150–400)
RBC: 4.43 MIL/uL (ref 3.87–5.11)
RDW: 13.2 % (ref 11.5–15.5)
WBC: 10.9 10*3/uL — AB (ref 4.0–10.5)

## 2014-07-04 LAB — BASIC METABOLIC PANEL WITH GFR
BUN: 9 mg/dL (ref 6–23)
CHLORIDE: 96 meq/L (ref 96–112)
CO2: 29 mEq/L (ref 19–32)
CREATININE: 0.69 mg/dL (ref 0.50–1.10)
Calcium: 9.9 mg/dL (ref 8.4–10.5)
GFR, Est African American: >89 mL/min
GFR, Est Non African American: >89 mL/min
GLUCOSE: 185 mg/dL — AB (ref 70–99)
Potassium: 4.1 mEq/L (ref 3.5–5.3)
Sodium: 133 mEq/L — ABNORMAL LOW (ref 135–145)

## 2014-07-04 LAB — URINALYSIS, ROUTINE W REFLEX MICROSCOPIC
Bilirubin Urine: NEGATIVE
Glucose, UA: 250 mg/dL — AB
Hgb urine dipstick: NEGATIVE
KETONES UR: NEGATIVE mg/dL
NITRITE: NEGATIVE
Protein, ur: NEGATIVE mg/dL
SPECIFIC GRAVITY, URINE: 1.012 (ref 1.005–1.030)
UROBILINOGEN UA: 0.2 mg/dL (ref 0.0–1.0)
pH: 5.5 (ref 5.0–8.0)

## 2014-07-04 LAB — LIPASE: Lipase: 10 U/L (ref 0–75)

## 2014-07-04 LAB — GLUCOSE, CAPILLARY
Glucose-Capillary: 223 mg/dL — ABNORMAL HIGH (ref 70–99)
Glucose-Capillary: 151 mg/dL — ABNORMAL HIGH (ref 70–99)

## 2014-07-04 LAB — URINE MICROSCOPIC-ADD ON

## 2014-07-04 MED ORDER — SIMVASTATIN 40 MG PO TABS
40.0000 mg | ORAL_TABLET | Freq: Every day | ORAL | Status: DC
  Administered 2014-07-04: 40 mg via ORAL
  Filled 2014-07-04 (×2): qty 1

## 2014-07-04 MED ORDER — CLONIDINE HCL 0.2 MG PO TABS
0.2000 mg | ORAL_TABLET | Freq: Two times a day (BID) | ORAL | Status: DC
  Administered 2014-07-05: 0.2 mg via ORAL
  Filled 2014-07-04 (×2): qty 1

## 2014-07-04 MED ORDER — INSULIN ASPART 100 UNIT/ML ~~LOC~~ SOLN
0.0000 [IU] | Freq: Three times a day (TID) | SUBCUTANEOUS | Status: DC
  Administered 2014-07-05: 5 [IU] via SUBCUTANEOUS
  Administered 2014-07-05: 2 [IU] via SUBCUTANEOUS
  Administered 2014-07-05: 5 [IU] via SUBCUTANEOUS

## 2014-07-04 MED ORDER — HYDRALAZINE HCL 100 MG PO TABS: 100.0000 mg | ORAL_TABLET | Freq: Three times a day (TID) | ORAL | Status: DC

## 2014-07-04 MED ORDER — CARBAMAZEPINE 200 MG PO TABS
200.0000 mg | ORAL_TABLET | Freq: Two times a day (BID) | ORAL | Status: DC
  Administered 2014-07-04 – 2014-07-05 (×2): 200 mg via ORAL
  Filled 2014-07-04 (×3): qty 1

## 2014-07-04 MED ORDER — NYSTATIN 100000 UNIT/GM EX POWD
Freq: Two times a day (BID) | CUTANEOUS | Status: DC
  Administered 2014-07-04 – 2014-07-05 (×2): via TOPICAL
  Filled 2014-07-04: qty 15

## 2014-07-04 MED ORDER — DIGOXIN 125 MCG PO TABS
0.1250 mg | ORAL_TABLET | Freq: Every day | ORAL | Status: DC
  Administered 2014-07-05: 0.125 mg via ORAL
  Filled 2014-07-04: qty 1

## 2014-07-04 MED ORDER — CLONIDINE HCL 0.2 MG PO TABS: 0.2000 mg | ORAL_TABLET | Freq: Two times a day (BID) | ORAL | Status: DC

## 2014-07-04 MED ORDER — ENOXAPARIN SODIUM 60 MG/0.6ML ~~LOC~~ SOLN
60.0000 mg | SUBCUTANEOUS | Status: DC
  Administered 2014-07-04: 60 mg via SUBCUTANEOUS
  Filled 2014-07-04 (×2): qty 0.6

## 2014-07-04 MED ORDER — OXYCODONE-ACETAMINOPHEN 5-325 MG PO TABS: 1.0000 | ORAL_TABLET | Freq: Three times a day (TID) | ORAL | Status: DC | PRN

## 2014-07-04 MED ORDER — METOPROLOL SUCCINATE ER 25 MG PO TB24
25.0000 mg | ORAL_TABLET | Freq: Every evening | ORAL | Status: DC
  Administered 2014-07-04: 25 mg via ORAL
  Filled 2014-07-04 (×2): qty 1

## 2014-07-04 MED ORDER — BACLOFEN 5 MG HALF TABLET
5.0000 mg | ORAL_TABLET | Freq: Three times a day (TID) | ORAL | Status: DC
  Administered 2014-07-04 – 2014-07-05 (×3): 5 mg via ORAL
  Filled 2014-07-04 (×4): qty 1

## 2014-07-04 MED ORDER — INSULIN ASPART 100 UNIT/ML ~~LOC~~ SOLN: 0.0000 [IU] | Freq: Three times a day (TID) | SUBCUTANEOUS | Status: DC

## 2014-07-04 MED ORDER — SODIUM CHLORIDE 0.9 % IJ SOLN
3.0000 mL | Freq: Two times a day (BID) | INTRAMUSCULAR | Status: DC
  Administered 2014-07-04 – 2014-07-05 (×2): 3 mL via INTRAVENOUS

## 2014-07-04 MED ORDER — CYCLOBENZAPRINE HCL 10 MG PO TABS: 10.0000 mg | ORAL_TABLET | Freq: Three times a day (TID) | ORAL | Status: DC | PRN

## 2014-07-04 MED ORDER — GABAPENTIN 300 MG PO CAPS
300.0000 mg | ORAL_CAPSULE | Freq: Three times a day (TID) | ORAL | Status: DC
  Administered 2014-07-04 – 2014-07-05 (×3): 300 mg via ORAL
  Filled 2014-07-04 (×4): qty 1

## 2014-07-04 MED ORDER — BRINZOLAMIDE 1 % OP SUSP: 1.0000 [drp] | Freq: Three times a day (TID) | OPHTHALMIC | Status: DC

## 2014-07-04 MED ORDER — FUROSEMIDE 40 MG PO TABS
40.0000 mg | ORAL_TABLET | Freq: Every day | ORAL | Status: DC
  Filled 2014-07-04: qty 1

## 2014-07-04 MED ORDER — INSULIN GLARGINE 100 UNIT/ML ~~LOC~~ SOLN
30.0000 [IU] | Freq: Every day | SUBCUTANEOUS | Status: DC
  Administered 2014-07-04: 30 [IU] via SUBCUTANEOUS
  Filled 2014-07-04 (×2): qty 0.3

## 2014-07-04 MED ORDER — ASPIRIN 325 MG PO TABS
325.0000 mg | ORAL_TABLET | Freq: Every day | ORAL | Status: DC
  Administered 2014-07-05: 325 mg via ORAL
  Filled 2014-07-04: qty 1

## 2014-07-04 MED ORDER — AMLODIPINE BESYLATE 5 MG PO TABS
5.0000 mg | ORAL_TABLET | Freq: Every day | ORAL | Status: DC
  Administered 2014-07-05: 5 mg via ORAL
  Filled 2014-07-04: qty 1

## 2014-07-04 MED ORDER — METOPROLOL SUCCINATE ER 50 MG PO TB24
50.0000 mg | ORAL_TABLET | Freq: Every morning | ORAL | Status: DC
  Administered 2014-07-05: 50 mg via ORAL
  Filled 2014-07-04: qty 1

## 2014-07-04 MED ORDER — HYDRALAZINE HCL 50 MG PO TABS
100.0000 mg | ORAL_TABLET | Freq: Three times a day (TID) | ORAL | Status: DC
  Administered 2014-07-05 (×2): 100 mg via ORAL
  Filled 2014-07-04 (×3): qty 2

## 2014-07-04 MED ORDER — PANTOPRAZOLE SODIUM 40 MG PO TBEC
40.0000 mg | DELAYED_RELEASE_TABLET | Freq: Every day | ORAL | Status: DC
  Administered 2014-07-05: 40 mg via ORAL
  Filled 2014-07-04: qty 1

## 2014-07-04 NOTE — Telephone Encounter (Signed)
Heart rate 136 now... 106 two hours ago. Pt does not feel well

## 2014-07-04 NOTE — Patient Instructions (Signed)
Patient to be admitted for further management.

## 2014-07-04 NOTE — H&P (Signed)
Date: 07/05/2014               Patient Name:  [name] MRN: [id number]  DOB: 11/12/1966 Age / Sex: 48 y.o., female   PCP: Courtney Paris, MD         Medical Service: Internal Medicine Teaching Service         Attending Physician: Dr. Aletta Edouard, MD    First Contact: Dr. Beckie Salts Pager: [phone]  Second Contact: Dr. Burtis Junes Pager: [phone]       After Hours (After 5p/  First Contact Pager: [phone]  weekends / holidays): Second Contact Pager: [phone]   Chief Complaint: weakness  History of Present Illness: [name] is a 48 y.o. woman with PMH significant for HTN, DM-II, NICM and LBBB s/p CRT-D implantation, recent hospitalization for UTI 06/24/14 who presents to floor from the [facility] clinic for increased heart rate. According to her clinic note, patient received a call from the telemonitoring company reporting that her heart rates are running in 130's. Patient reported "not feeling well" to them and was eventually recommended to go to the ED, or urgent care for further management. She set up an appointment with her PCP. Pt has had tachycardic episodes that feel like her heart racing. These episodes started 2 months ago and last for a few minutes. She gets them several times a day. When they occur, she feels dizzy and weak and will sit down until it passes. Pt had a CRT-D implantation in September of 2014. She has never had it interrogated before. She denies any chest pain or shortness of breath.  Patient reports that she is feeling weak and tired. Patient was admitted to the hospital on 06/24/14 for a UTI and discharged from the hospital that day on Bactrim. Patient was seen in the clinic on 07/03/14 for similar symptoms and her lab work including CBC, BMP, Lipase were within normal limits. Patient reports compliance to her bactrim ds and reports that today would be her last day of the abx course. She admits to chills this morning, nausea, suprapubic pain and back pain, but denies  dysuria or frequency.   Meds: Current Facility-Administered Medications  Medication Dose Route Frequency Provider Last Rate Last Dose  . amLODipine (NORVASC) tablet 5 mg  5 mg Oral Daily Courtney Paris, MD      . aspirin tablet 325 mg  325 mg Oral Daily Courtney Paris, MD      . baclofen (LIORESAL) tablet 5 mg  5 mg Oral TID Courtney Paris, MD   5 mg at 07/04/14 2229  . carbamazepine (TEGRETOL) tablet 200 mg  200 mg Oral BID Courtney Paris, MD   200 mg at 07/04/14 2230  . cloNIDine (CATAPRES) tablet 0.2 mg  0.2 mg Oral BID Courtney Paris, MD      . cyclobenzaprine (FLEXERIL) tablet 10 mg  10 mg Oral TID PRN Courtney Paris, MD      . digoxin Margit Banda) tablet 0.125 mg  0.125 mg Oral Daily Courtney Paris, MD      . enoxaparin (LOVENOX) injection 60 mg  60 mg Subcutaneous Q24H Courtney Paris, MD   60 mg at 07/04/14 2231  . furosemide (LASIX) tablet 40 mg  40 mg Oral Daily Courtney Paris, MD      . gabapentin (NEURONTIN) capsule 300 mg  300 mg Oral TID Courtney Paris, MD   300 mg at 07/04/14 2230  . hydrALAZINE (APRESOLINE)  tablet 100 mg  100 mg Oral TID Courtney Paris, MD      . insulin aspart (novoLOG) injection 0-15 Units  0-15 Units Subcutaneous TID WC Courtney Paris, MD      . insulin glargine (LANTUS) injection 30 Units  30 Units Subcutaneous QHS Courtney Paris, MD   30 Units at 07/04/14 2250  . metoprolol succinate (TOPROL-XL) 24 hr tablet 25 mg  25 mg Oral QPM Courtney Paris, MD   25 mg at 07/04/14 2231  . metoprolol succinate (TOPROL-XL) 24 hr tablet 50 mg  50 mg Oral q morning - 10a Courtney Paris, MD      . nystatin (MYCOSTATIN/NYSTOP) topical powder   Topical BID Courtney Paris, MD      . oxyCODONE-acetaminophen (PERCOCET/ROXICET) 5-325 MG per tablet 1 tablet  1 tablet Oral Q8H PRN Courtney Paris, MD      . pantoprazole (PROTONIX) EC tablet 40 mg  40 mg Oral Daily Courtney Paris, MD      . simvastatin (ZOCOR) tablet 40 mg  40 mg Oral Daily Courtney Paris, MD   40 mg at 07/04/14 2231  . sodium chloride 0.9 % injection 3 mL   3 mL Intravenous Q12H Courtney Paris, MD   3 mL at 07/04/14 2232    Allergies: Allergies as of 07/04/2014 - Review Complete 07/04/2014  Allergen Reaction Noted  . Clindamycin Anaphylaxis and Swelling   . Ace inhibitors Swelling 07/04/2014  . Enalapril maleate Swelling   . Lisinopril Swelling    Past Medical History  Diagnosis Date  . Hypertension     Poorly controlled. Has had HTN since age 10. Angioedema with ACEI.  24 Hr urine and renal arterial dopplers ordered . . . Never done  . NICM (nonischemic cardiomyopathy)     EF 45-50% in 8/12, cath 6/12 showed normal coronaries, EF 50-55% by LV gram  . Morbid obesity   . Allergic rhinitis   . HLD (hyperlipidemia)   . OSA on CPAP     sleep study in 8/12 showed moderate to severe OSA requiring CPAP  . Chronic lower back pain     secondary to DJD, obsetiy, hip problems. Followed by Dr. Ivory Broad (pain management)  . Chronic diastolic heart failure      Primarily diastolic CHF: Likely due to uncontrolled HTN. Last echo (8/12) with EF 45-50%, mild to moderate LVH with some asymmetric septal hypertrophy, RV normal size and systolic function. EF 50-55% by LV-gram in 6/12.   . Polyneuropathy in diabetes(357.2)   . Thoracic or lumbosacral neuritis or radiculitis, unspecified   . Calcifying tendinitis of shoulder   . LBBB (left bundle branch block)   . Chronic combined systolic and diastolic CHF (congestive heart failure)     EF 40-45% by echo 12/06/2012  . Coronary artery disease     questionable. LHC 05/2011 showing normal coronaries // Followed at [facility], Dr. Shirlee Latch  . GERD (gastroesophageal reflux disease)   . Shortness of breath   . Asthma   . Automatic implantable cardioverter-defibrillator in situ   . Stroke 12/2013    "my left side is paralyzed" (07/04/2014)  . Type II diabetes mellitus DX: 2002  . 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 Degeneration of lumbar or lumbosacral intervertebral disc   .  Arthritis     "hips, back, legs, arms" (07/04/2014)  . Frequent UTI    Past Surgical History  Procedure Laterality Date  . Tubal ligation  05/31/1985  . Carpal tunnel release Left   . Hemiarthroplasty shoulder fracture Right 1980's  . Multiple tooth extractions  ~ 2011    tumors removed ; "my whole top"  . Cardiac catheterization  05/2011  . Tee without cardioversion N/A 01/14/2014    Procedure: TRANSESOPHAGEAL ECHOCARDIOGRAM (TEE);  Surgeon: Thurmon Fair, MD;  Location: [facility] ENDOSCOPY;  Service: Cardiovascular;  Laterality: N/A;  . Breast surgery Bilateral 2011    patient reports benign results  . Bi-ventricular implantable cardioverter defibrillator  (crt-d)  08/2013    [name] 08/23/2013   Family History  Problem Relation Age of Onset  . Heart disease Mother 56    Died of MI at age 79 yo  . Heart disease Paternal Grandmother     requiring pacemaker.  [name] Heart disease Paternal Grandfather 45    Died of MI at possibly age 78-53yo  . Stroke Paternal Grandfather   . Heart disease Father 67    MI age 19yo requiring stenting  . Kidney disease Mother     requiring dialysis  . Congestive Heart Failure Mother   . Diabetes Father   . Diabetes Brother   . Heart disease Brother 56    MI at age 18 years old  . Breast cancer Paternal Aunt   . Breast cancer Maternal Grandmother    History   Social History  . Marital Status: Single    Spouse Name: N/A    Number of Children: 2  . Years of Education: 9th grade   Occupational History  . Unemployed     planning on getting disability   Social History Main Topics  . Smoking status: Never Smoker   . Smokeless tobacco: Never Used  . Alcohol Use: No  . Drug Use: No  . Sexual Activity: Yes    Birth Control/ Protection: Surgical   Other Topics Concern  . Not on file   Social History Narrative   Lives in [place] with her son. Is able to read and write fluently in [place].    Review of Systems: General: Admits to chills,  decreased appetite and fatigue. Denies fever, diaphoresis. Respiratory: Denies SOB, DOE, cough, chest tightness, and wheezing.   Cardiovascular: Admits to palpitations and increased heart beat. Denies chest pain.  Gastrointestinal: Admits to nausea. Denies vomiting, abdominal pain, diarrhea, constipation, blood in stool and abdominal distention.  Genitourinary: Admits to suprapubic pain and R and L flank pain. Denies dysuria, urgency, frequency, hematuria. Endocrine: Denies hot or cold intolerance, polyuria, and polydipsia. Musculoskeletal: Admits to back pain. Denies myalgias, joint swelling, arthralgias and gait problem.  Skin: Denies pallor, rash and wounds.  Neurological: Admits to dizziness, weakness and lightheadedness. Denies seizures, syncope, numbness and headaches.  Psychiatric/Behavioral: Denies mood changes, confusion, nervousness, sleep disturbance and agitation.  Physical Exam: Filed Vitals:   07/04/14 1910 07/04/14 2240  BP: 110/70   Pulse: 80 88  Temp: 97.7 F (36.5 C)   TempSrc: Oral   Resp: 18 16  Height: 5\' 9"  (1.753 m)   Weight: 265 lb 10.5 oz (120.5 kg)   SpO2: 96% 96%   General: Vital signs reviewed.  Patient is a well-developed and well-nourished, in no acute distress and cooperative with exam.  Head: Normocephalic and atraumatic. Eyes: PERRL, EOMI, conjunctivae normal, No scleral icterus.  Neck: Supple, trachea midline, normal ROM, No JVD, masses, thyromegaly, or carotid bruit present.  Cardiovascular: RRR, S1 normal, S2 normal, no murmurs, gallops, or rubs. Pulmonary/Chest: Clear  to auscultation bilaterally, no wheezes, rales, or rhonchi. Abdominal: Mildly tender in suprapubic area. Soft, non-distended, normoactive BS, no guarding present.  Musculoskeletal: No CVA tenderness.  No joint deformities, erythema, or stiffness, ROM full and nontender. Extremities: No lower extremity edema edema,  pulses symmetric and intact bilaterally. No cyanosis or  clubbing. Neurological: A&O x3, Strength is 5/5 in upper and lower right extremities, but 0/5 in upper and lower left extremities Skin: Warm, dry and intact. No rashes or erythema. Psychiatric: Normal mood and affect. speech and behavior is normal. Cognition and memory are normal.   Lab results: Basic Metabolic Panel:  Recent Labs  16/10/96 1524  NA 133*  K 4.1  CL 96  CO2 29  GLUCOSE 185*  BUN 9  CREATININE 0.69  CALCIUM 9.9   Liver Function Tests:  Recent Labs  07/03/14 1524  AST 12  ALT 11  ALKPHOS 66  BILITOT 0.6  PROT 7.5  ALBUMIN 4.4    Recent Labs  07/03/14 1524  LIPASE 10   CBC:  Recent Labs  07/03/14 1524 07/04/14 2100  WBC 8.5 10.9*  NEUTROABS 4.1  --   HGB 12.2 12.3  HCT 37.1 38.3  MCV 82.1 86.5  PLT 323 291   CBG:  Recent Labs  07/03/14 1538 07/04/14 1816 07/04/14 2111  GLUCAP 170* 223* 151*   Hemoglobin A1C:  Recent Labs  07/03/14 1546  HGBA1C 8.3   Other results: EKG: Atrial-sensed ventricular-paced rhythm with new T wave inversions in leads V5 and V6.  Assessment & Plan by Problem:  Generalized Weakness: Patient complaining of generalized weakness and malaise since her recent admission on 06/24/14 for UTI. She claims she has felt tired and "run down". Patient claims she completed full course of Bactrim for E. coli UTI. Currently denies any symptoms of dysuria, but does admit to some mild right flank pain. No CVA tenderness on exam. Patient has a very mild leukocytosis of 10.9 on admission, denies any cough, fever, or chills at home. Patient does admit to recent infrequent palpitations and has been shown to have intermittent tachycardia at home, discussed further below. UA shows moderate leukocytes, otherwise normal. Patient also claims her blood sugars have been well controlled, HbA1c 8.3 yesterday, significantly improved (discussed further below). This weakness may be still 2/2 resolving UTI vs deconditioning in the setting of  baseline left-sided residual weakness. Patient also w/ very mild hyponatremia on labs from 7/22.  -Admit to telemetry for further workup of tachycardia as discussed below  -Urine culture pending  -CBC, BMP in AM  -PT/OT evaluation   Tachycardia: Reason for concern from [facility] clinic today. Some concern involving pacemaker, however, may also be related to resolving or new onset infection, or deconditioning. On review of previous notes and clinic visits, patient does have intermittent tachycardia, most likely related to deconditioning. Ms. Barrientes suffered from a significant CVA in 12/2013, leaving her with residual left-sided weakness, causing her difficulty w/ ambulation. This has improved significantly w/ PT, however, she still has weakness and poor exercise tolerance. EKG in the clinic shows rate of 96 bpm, atrial-sensed ventricular-paced rhythm. Does show new TWI in V5, V6, not present in EKG from 06/24/14. Patient denies chest pain, but does admit to infrequent dizziness and palpitations. Most recently, HR in 80's.  -Telemetry as above  -Repeat EKG in AM  -Troponins x3  -Discuss further with cardiology in AM; interrogate PPM   HTN: Normotensive on admission. Renal artery duplex in 04/28/12 showed NO renal artery stenosis.  Pt on amlodipine 5 mg daily, clonidine 0.2 mg BID, hydralazine 100 mg TID, metoprolol succinate 50 mg in AM, 25 mg in PM.  -Continue home meds   Type II DM with neuropathy: Glucose 220's on admission. Last HbA1c 8.3 on 07/03/14, significantly improved from 12.1 in 03/2014. Patient is on metformin 1000 mg daily, Lantus 10 units in the morning and 35 units at bedtime, and gabapentin 300 mg TID.  -Lantus 30 units qhs for now  -Moderate SSI   Non-ischemic cardiomyopathy with chronic diastolic and systolic heart failure: S/p PPM placement in 08/2013. Most recent echocardiogram in 01/14/14 shows EF of 35-40%. Patient denies any chest pain or shortness of breath. On amlodipine, clonidine,  hydralazine, Toprol-XL, simvastatin 40 mg daily, digoxin 0.125 mg daily, ASA 325 mg daily and Lasix 40 mg daily.  -Continue home medications  -Trops as above, EKG in AM   HLD: Most recent lipid panel on 01/10/14 shows cholesterol 187, TG 194, HDL 43, LDL 105. On simvastatin 40 mg daily at home.  -Continue simvastatin 40 mg daily   CAD: Questionable. LHC 05/2011 showing normal coronaries. Pt is followed at [facility], Dr. Shirlee Latch.  -Continue ASA and statin   OSA: On CPAP at home  -Continue CPAP qhs   H/o of CVA: CVA in January of 2015. Pt has residual left sided weakness in upper and lower extremities. 1/5 on exam. 5/5 on the right side. No focal deficits. CN II-XII grossly intact.  -Continue home medications as above   DVT/PE ppx: Lovenox [place]     Dispo: Disposition is deferred at this time, awaiting improvement of current medical problems. Anticipated discharge in approximately 1-2 day(s).   The patient does have a current PCP Courtney Paris, MD) and does need an [facility] hospital follow-up appointment after discharge.  The patient does not have transportation limitations that hinder transportation to clinic appointments.  Signed: Jill Alexanders, DO PGY-1 Internal Medicine Resident Pager # [phone] 07/05/2014 1:38 AM

## 2014-07-04 NOTE — Progress Notes (Signed)
I saw patient and discussed her care with resident Dr. Comer Locket at the time of the visit.  We reviewed the resident's history and exam and pertinent patient test results.  I agree with the assessment, diagnosis, and plan of care documented in the resident's note, including plan to admit for observation given tachycardia, severe malaise, and worsening back/abdominal pain.

## 2014-07-04 NOTE — Telephone Encounter (Signed)
Pt is asking for refill on ProAir inhaler.  Last filled 06/05/14 This has been taken off med list. Please refill. Hx: Asthma

## 2014-07-04 NOTE — Progress Notes (Signed)
Report called Nurse on [address].  Pt transported to [address] 2 via wheelchair.  Angelina Ok, RN 07/04/2014 622 PM.

## 2014-07-04 NOTE — Progress Notes (Signed)
Subjective:   Patient ID: [name] female   DOB: [birth year] 48 y.o.   MRN: [id number]  HPI: [name] is a 48 y.o. woman with PMH significant for HTN, DM-II, NICM and LBBB s/p CRT-D implantation, recent hospitalization for UTI 06/24/14 comes to the office for increased heart rate.  Patient received a call from the telemonitoring company reporting that her heart rates are running in 130's. Patient reported "not feeling well" to them and was eventually recommended to go to the ED, or urgent care for further management.  Patient reports that she is feeling weak and tired. She reports chills this morning and had to use a blanket. She denies any fever but reports nausea but no episodes of vomiting. Patient reports being sleepy most of the day for the last 2 days. Patient reports abdominal pain. Patient denies any dizziness, chest pain, palpitations, sob. Patient was seen in the clinic on 07/03/14 for similar symptoms and her lab work including CBC, BMP, Lipase were within normal limits. Patient reports compliance to her bactrim ds and reports that today would be her last day of the abx course.   She denies any other complaints.  Past Medical History  Diagnosis Date  . Hypertension     Poorly controlled. Has had HTN since age 48. Angioedema with ACEI.  24 Hr urine and renal arterial dopplers ordered . . . Never done  . Type II or unspecified type diabetes mellitus without mention of complication, uncontrolled DX: 2002  . NICM (nonischemic cardiomyopathy)     EF 45-50% in 8/12, cath 6/12 showed normal coronaries, EF 50-55% by LV gram  . Morbid obesity   . Allergic rhinitis   . HLD (hyperlipidemia)   . OSA on CPAP     sleep study in 8/12 showed moderate to severe OSA requiring CPAP  . Chronic lower back pain     secondary to DJD, obsetiy, hip problems. Followed by Dr. Ivory Broad (pain management)  . 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 Chronic diastolic  heart failure      Primarily diastolic CHF: Likely due to uncontrolled HTN. Last echo (8/12) with EF 45-50%, mild to moderate LVH with some asymmetric septal hypertrophy, RV normal size and systolic function. EF 50-55% by LV-gram in 6/12.   . Degeneration of lumbar or lumbosacral intervertebral disc   . Polyneuropathy in diabetes(357.2)   . Thoracic or lumbosacral neuritis or radiculitis, unspecified   . Calcifying tendinitis of shoulder   . LBBB (left bundle branch block)   . Chronic combined systolic and diastolic CHF (congestive heart failure)     EF 40-45% by echo 12/06/2012  . Coronary artery disease     questionable. LHC 05/2011 showing normal coronaries // Followed at [facility], Dr. Shirlee Latch  . GERD (gastroesophageal reflux disease)   . UTI (urinary tract infection) 06/2014  . Shortness of breath   . Asthma    Current Outpatient Prescriptions  Medication Sig Dispense Refill  . amLODipine (NORVASC) 5 MG tablet Take 1 tablet (5 mg total) by mouth daily.  30 tablet  3  . aspirin 325 MG tablet Take 1 tablet (325 mg total) by mouth daily.  30 tablet  5  . baclofen (LIORESAL) 10 MG tablet Take 0.5-1 tablets (5-10 mg total) by mouth 3 (three) times daily.  90 tablet  3  . carbamazepine (TEGRETOL) 200 MG tablet Take 200 mg by mouth 2 (two) times daily.      [name]  cloNIDine (CATAPRES) 0.2 MG tablet Take 0.2 mg by mouth 2 (two) times daily.      . cyclobenzaprine (FLEXERIL) 10 MG tablet Take 10 mg by mouth 3 (three) times daily as needed for muscle spasms.      . diclofenac sodium (VOLTAREN) 1 % GEL Apply 2 g topically 4 (four) times daily as needed (pain).  3 Tube  5  . digoxin (LANOXIN) 0.125 MG tablet Take 0.125 mg by mouth daily.      . fluticasone (CUTIVATE) 0.05 % cream Apply topically 2 (two) times daily. Dispense two tubes  30 g  0  . furosemide (LASIX) 40 MG tablet Take 40 mg by mouth.      . gabapentin (NEURONTIN) 300 MG capsule Take 1 capsule (300 mg total) by mouth 3 (three)  times daily. Day 1: 300 mg, Day 2: 300 mg twice daily, Day 3: 300 mg 3 times daily  60 capsule  0  . hydrALAZINE (APRESOLINE) 100 MG tablet Take 100 mg by mouth 3 (three) times daily.      [name] ibuprofen (ADVIL,MOTRIN) 200 MG tablet Take 400 mg by mouth 3 (three) times daily as needed for mild pain.      [name] insulin glargine (LANTUS) 100 UNIT/ML injection Inject 0.1-0.3 mLs (10-30 Units total) into the skin 2 (two) times daily. Take 10 U in the AM, and 35 U in the PM. Dx Code: 250.62.  10 mL  5  . metFORMIN (GLUCOPHAGE) 1000 MG tablet Take 1 tablet (1,000 mg total) by mouth daily with breakfast.  30 tablet  11  . metoprolol succinate (TOPROL-XL) 25 MG 24 hr tablet Take 50 mg (2 tablets) in the morning and 25 mg (1 tablet) in the evening.  90 tablet  3  . oxyCODONE-acetaminophen (PERCOCET) 10-325 MG per tablet Take 1 tablet by mouth every 8 (eight) hours as needed for pain.  90 tablet  0  . pantoprazole (PROTONIX) 40 MG tablet Take 40 mg by mouth daily.      . simvastatin (ZOCOR) 40 MG tablet Take 40 mg by mouth daily.      . promethazine (PHENERGAN) 12.5 MG tablet Take 1 tablet (12.5 mg total) by mouth every 6 (six) hours as needed for nausea or vomiting.  30 tablet  0   No current facility-administered medications for this visit.   Family History  Problem Relation Age of Onset  . Heart disease Mother 50    Died of MI at age 60 yo  . Heart disease Paternal Grandmother     requiring pacemaker.  [name] Heart disease Paternal Grandfather 42    Died of MI at possibly age 60-53yo  . Stroke Paternal Grandfather   . Heart disease Father 101    MI age 70yo requiring stenting  . Kidney disease Mother     requiring dialysis  . Congestive Heart Failure Mother   . Diabetes Father   . Diabetes Brother   . Heart disease Brother 67    MI at age 53 years old  . Breast cancer Paternal Aunt   . Breast cancer Maternal Grandmother    History   Social History  . Marital Status: Single    Spouse Name: N/A     Number of Children: 2  . Years of Education: 9th grade   Occupational History  . Unemployed     planning on getting disability   Social History Main Topics  . Smoking status: Never Smoker   . Smokeless tobacco: Never  Used  . Alcohol Use: No  . Drug Use: No  . Sexual Activity: Yes    Birth Control/ Protection: Surgical   Other Topics Concern  . None   Social History Narrative   Lives in [place] with her son. Is able to read and write fluently in [place].   Review of Systems: Pertinent items are noted in HPI. Objective:  Physical Exam: Filed Vitals:   07/04/14 1617  BP: 124/87  Pulse: 121  Temp: 97.9 F (36.6 C)  TempSrc: Oral  Height: 5\' 9"  (1.753 m)  Weight: 268 lb 14.4 oz (121.972 kg)  SpO2: 99%   Constitutional: Vital signs reviewed.  Patient is a well-developed and well-nourished, sitting in a wheel chair, appears tired but is in no acute distress and cooperative with exam.  Mouth: Dry mucous membranes. Eyes: Conjunctivae normal, No scleral icterus.  Neck: Supple. No JVD. Cardiovascular: Tachycardic, but regular in rate and rhythm. S1S2 normal. No M/R/G. Pulmonary/Chest: normal respiratory effort, CTAB, no wheezes, rales, or rhonchi Abdominal: Soft. No distension noted. Mild TTP over the epigastric area. No guarding or rigidity. GU: ? CVA Tenderness. Musculoskeletal: No joint deformities, erythema, or stiffness, ROM full and no nontender Extremities: No pedal edema. Neurological: A&O x3 Skin: Warm, dry and intact.   Assessment & Plan:

## 2014-07-04 NOTE — Assessment & Plan Note (Signed)
Tachycardia, atrial sensed ventricular paced rhythm, on the 12 lead ECG. Unclear etiology. Differentials include: Intravascular volume depletion from diuresis resulting in tachycardia vs  Pacemaker mediated tachycardia  Vs Run away pacemaker (especially with a documented rate of 136 per minute) vs ?Persistent UTI (less likely given no urinary symptoms) vs ? Early SIRS (Less likely, Other than tachycardia, she does not meet criteria for SIRS). Discussed with the attending regarding further management and plans.  Plans: Admit patient to Telemetry for observation. Patient would need CRT-D interrogation to rule out malfunctions and to see if the atrial arrythmias are triggering the ventricular pacing. Rest of the management as per admitting team.

## 2014-07-04 NOTE — Telephone Encounter (Signed)
Patients daughter called concerning patient.  Patient reports increasing fatigue and overall "not feeling well" for the last few days.  Received fax from telemonitoring, HR 130s, BP 160/90s, weight stable at 262lb.  Patient Dx with UTI 2 weeks ago, unsure whether she completed antibiotics or not.  Instructed to seek medical attention at urgent care or ED for possible recurrent/unresolved UTI.  Aware and agreeable.

## 2014-07-04 NOTE — Progress Notes (Signed)
Case discussed with Dr. McLean soon after the resident saw the patient.  We reviewed the resident's history and exam and pertinent patient test results.  I agree with the assessment, diagnosis, and plan of care documented in the resident's note. 

## 2014-07-05 ENCOUNTER — Other Ambulatory Visit: Payer: Self-pay

## 2014-07-05 DIAGNOSIS — E1149 Type 2 diabetes mellitus with other diabetic neurological complication: Secondary | ICD-10-CM

## 2014-07-05 DIAGNOSIS — R5381 Other malaise: Secondary | ICD-10-CM | POA: Diagnosis not present

## 2014-07-05 DIAGNOSIS — R5383 Other fatigue: Principal | ICD-10-CM

## 2014-07-05 DIAGNOSIS — E785 Hyperlipidemia, unspecified: Secondary | ICD-10-CM

## 2014-07-05 DIAGNOSIS — R000 Tachycardia, unspecified: Secondary | ICD-10-CM

## 2014-07-05 DIAGNOSIS — I428 Other cardiomyopathies: Secondary | ICD-10-CM | POA: Diagnosis not present

## 2014-07-05 DIAGNOSIS — E1142 Type 2 diabetes mellitus with diabetic polyneuropathy: Secondary | ICD-10-CM

## 2014-07-05 DIAGNOSIS — G4723 Circadian rhythm sleep disorder, irregular sleep wake type: Secondary | ICD-10-CM

## 2014-07-05 DIAGNOSIS — I10 Essential (primary) hypertension: Secondary | ICD-10-CM

## 2014-07-05 DIAGNOSIS — I5042 Chronic combined systolic (congestive) and diastolic (congestive) heart failure: Secondary | ICD-10-CM | POA: Diagnosis not present

## 2014-07-05 DIAGNOSIS — Z8673 Personal history of transient ischemic attack (TIA), and cerebral infarction without residual deficits: Secondary | ICD-10-CM

## 2014-07-05 LAB — TROPONIN I

## 2014-07-05 LAB — DIFFERENTIAL
BASOS ABS: 0 10*3/uL (ref 0.0–0.1)
Basophils Absolute: 0 10*3/uL (ref 0.0–0.1)
Basophils Relative: 0 % (ref 0–1)
Basophils Relative: 0 % (ref 0–1)
Eosinophils Absolute: 0.1 10*3/uL (ref 0.0–0.7)
Eosinophils Absolute: 0.2 10*3/uL (ref 0.0–0.7)
Eosinophils Relative: 1 % (ref 0–5)
Eosinophils Relative: 2 % (ref 0–5)
LYMPHS ABS: 3.8 10*3/uL (ref 0.7–4.0)
LYMPHS PCT: 32 % (ref 12–46)
LYMPHS PCT: 43 % (ref 12–46)
Lymphs Abs: 3.4 10*3/uL (ref 0.7–4.0)
MONO ABS: 1.1 10*3/uL — AB (ref 0.1–1.0)
MONOS PCT: 8 % (ref 3–12)
Monocytes Absolute: 0.7 10*3/uL (ref 0.1–1.0)
Monocytes Relative: 10 % (ref 3–12)
NEUTROS ABS: 6.1 10*3/uL (ref 1.7–7.7)
NEUTROS ABS: 4.1 10*3/uL (ref 1.7–7.7)
NEUTROS PCT: 47 % (ref 43–77)
Neutrophils Relative %: 57 % (ref 43–77)

## 2014-07-05 LAB — GLUCOSE, CAPILLARY
Glucose-Capillary: 129 mg/dL — ABNORMAL HIGH (ref 70–99)
Glucose-Capillary: 205 mg/dL — ABNORMAL HIGH (ref 70–99)
Glucose-Capillary: 245 mg/dL — ABNORMAL HIGH (ref 70–99)

## 2014-07-05 LAB — CBC
HEMATOCRIT: 36.1 % (ref 36.0–46.0)
Hemoglobin: 11.5 g/dL — ABNORMAL LOW (ref 12.0–15.0)
MCH: 27.4 pg (ref 26.0–34.0)
MCHC: 31.9 g/dL (ref 30.0–36.0)
MCV: 86 fL (ref 78.0–100.0)
Platelets: 282 10*3/uL (ref 150–400)
RBC: 4.2 MIL/uL (ref 3.87–5.11)
RDW: 13.3 % (ref 11.5–15.5)
WBC: 11.4 10*3/uL — ABNORMAL HIGH (ref 4.0–10.5)

## 2014-07-05 LAB — BASIC METABOLIC PANEL
ANION GAP: 14 (ref 5–15)
BUN: 9 mg/dL (ref 6–23)
CHLORIDE: 99 meq/L (ref 96–112)
CO2: 27 mEq/L (ref 19–32)
Calcium: 9.1 mg/dL (ref 8.4–10.5)
Creatinine, Ser: 0.69 mg/dL (ref 0.50–1.10)
GFR calc non Af Amer: >90 mL/min (ref >90)
Glucose, Bld: 123 mg/dL — ABNORMAL HIGH (ref 70–99)
Potassium: 3.5 mEq/L — ABNORMAL LOW (ref 3.7–5.3)
SODIUM: 140 meq/L (ref 137–147)

## 2014-07-05 MED ORDER — ATORVASTATIN CALCIUM 20 MG PO TABS
20.0000 mg | ORAL_TABLET | Freq: Every day | ORAL | Status: DC
  Filled 2014-07-05: qty 1

## 2014-07-05 MED ORDER — DEXTROSE 5 % IV SOLN
1.0000 g | Freq: Once | INTRAVENOUS | Status: AC
  Administered 2014-07-05: 1 g via INTRAVENOUS
  Filled 2014-07-05: qty 10

## 2014-07-05 MED ORDER — FLUCONAZOLE 150 MG PO TABS
150.0000 mg | ORAL_TABLET | Freq: Once | ORAL | Status: AC
  Administered 2014-07-05: 150 mg via ORAL
  Filled 2014-07-05: qty 1

## 2014-07-05 NOTE — Discharge Summary (Signed)
Name: [name] MRN: [id number] DOB: [birth year] 48 y.o. PCP: Courtney Paris, MD  Date of Admission: 07/04/2014  6:45 PM Date of Discharge: 07/05/2014 Attending Physician: Aletta Edouard, MD  Discharge Diagnosis: 1. Generalized Weakness 2. Tachycardia- Resolved 3. HTN 4. Type 2 DM 5. NICM w/ Chronic Diastolic and Systolic HF (s/p pacemaker placement) 6. HLD 7. CAD 8. OSA 9. H/o CVA   Discharge Medications:   Medication List         amLODipine 5 MG tablet  Commonly known as:  NORVASC  Take 1 tablet (5 mg total) by mouth daily.     aspirin 325 MG tablet  Take 1 tablet (325 mg total) by mouth daily.     baclofen 10 MG tablet  Commonly known as:  LIORESAL  Take 0.5-1 tablets (5-10 mg total) by mouth 3 (three) times daily.     carbamazepine 200 MG tablet  Commonly known as:  TEGRETOL  Take 200 mg by mouth 2 (two) times daily.     cloNIDine 0.2 MG tablet  Commonly known as:  CATAPRES  Take 0.2 mg by mouth 2 (two) times daily.     cyclobenzaprine 10 MG tablet  Commonly known as:  FLEXERIL  Take 10 mg by mouth 3 (three) times daily as needed for muscle spasms.     diclofenac sodium 1 % Gel  Commonly known as:  VOLTAREN  Apply 2 g topically 4 (four) times daily as needed (pain).     digoxin 0.125 MG tablet  Commonly known as:  LANOXIN  Take 0.125 mg by mouth daily.     fluticasone 0.05 % cream  Commonly known as:  CUTIVATE  Apply topically 2 (two) times daily. Dispense two tubes     furosemide 40 MG tablet  Commonly known as:  LASIX  Take 40 mg by mouth daily.     gabapentin 300 MG capsule  Commonly known as:  NEURONTIN  Take 1 capsule (300 mg total) by mouth 3 (three) times daily. Day 1: 300 mg, Day 2: 300 mg twice daily, Day 3: 300 mg 3 times daily     hydrALAZINE 100 MG tablet  Commonly known as:  APRESOLINE  Take 100 mg by mouth 3 (three) times daily.     ibuprofen 200 MG tablet  Commonly known as:  ADVIL,MOTRIN  Take 400 mg by mouth 3  (three) times daily as needed for mild pain.     insulin glargine 100 UNIT/ML injection  Commonly known as:  LANTUS  Inject 10-35 Units into the skin 2 (two) times daily. 10 units in am, 35 units in pm     metFORMIN 1000 MG tablet  Commonly known as:  GLUCOPHAGE  Take 1 tablet (1,000 mg total) by mouth daily with breakfast.     metoprolol succinate 25 MG 24 hr tablet  Commonly known as:  TOPROL-XL  Take 50 mg (2 tablets) in the morning and 25 mg (1 tablet) in the evening.     oxyCODONE-acetaminophen 10-325 MG per tablet  Commonly known as:  PERCOCET  Take 1 tablet by mouth every 8 (eight) hours as needed for pain.     pantoprazole 40 MG tablet  Commonly known as:  PROTONIX  Take 40 mg by mouth daily.     promethazine 12.5 MG tablet  Commonly known as:  PHENERGAN  Take 1 tablet (12.5 mg total) by mouth every 6 (six) hours as needed for nausea or vomiting.     simvastatin 40  MG tablet  Commonly known as:  ZOCOR  Take 40 mg by mouth at bedtime.        Disposition and follow-up:   [name] [facility].  At the hospital follow up visit please address:  1.  Tachycardia/UTI symptoms  2.  Labs / imaging needed at time of follow-up: CBC  3.  Pending labs/ test needing follow-up: Urine culture  Follow-up Appointments: Follow-up Information   Follow up with Annett GulaMcLean, Tracy N, MD. (July 29th @ 3:45PM)    Specialty:  Internal Medicine   Contact information:   [address] [place] [address] [phone]       Discharge Instructions: Discharge Instructions   Diet - low sodium heart healthy    Complete by:  As directed      Increase activity slowly    Complete by:  As directed            Consultations:  None  Procedures Performed:  No results found.  Admission HPI: [name] is a 48 y.o. woman with PMH significant for HTN, DM-II, NICM and LBBB s/p CRT-D implantation, recent hospitalization  for UTI 06/24/14 who [facility] for increased heart rate. According to her clinic note, patient received a call from the telemonitoring company reporting that her heart rates are running in 130's. Patient reported "not feeling well" to them and was eventually recommended to go to the ED, or urgent care for further management. She set up an appointment with her PCP. Pt has had tachycardic episodes that feel like her heart racing. These episodes started 2 months ago and last for a few minutes. She gets them several times a day. When they occur, she feels dizzy and weak and will sit down until it passes. Pt had a CRT-D implantation in September of 2014. She has never had it interrogated before. She denies any chest pain or shortness of breath.   Patient reports that she is feeling weak and tired. Patient was admitted to the hospital on 06/24/14 for a UTI and discharged from the hospital that day on Bactrim. Patient was seen in the clinic on 07/03/14 for similar symptoms and her lab work including CBC, BMP, Lipase were within normal limits. Patient reports compliance to her bactrim ds and reports that today would be her last day of the abx course. She admits to chills this morning, nausea, suprapubic pain and back pain, but denies dysuria or frequency.    Hospital Course by problem list:  1. Generalized Weakness: Patient complained of generalized weakness and feeling "run-down" since her recent admission on 06/24/14 for E.Coli UTI. She completed her 10 day course of Bactrim. She denies dysuria, hematuria, frequency, fever, and chills. She does complain of mild right flank pain but does not have suprapubic or CVA tenderness on exam. Her WBC was 10.9 on admission, now 11.4. Pt admits to recent infrequent palpitations and shown to have intermittent tachycardia at home (see below). U/A shows moderate leukocytes. Pt received 1 dose of Rocephin. Pt states her blood sugars have been well  controlled, HbA1c 8.3 on 07/03/14. Weakness likely 2/2 resolving UTI vs. Deconditioning in setting of baseline left-sided residual weakness from previous stroke. Urine cultures were taken. PT/OT evaluated patient and she can independently perform ADLs.   2. Tachycardia- Resolved: [facility]. Some concern involving pacemaker, however, may also be related to resolving or new onset  infection, or deconditioning. On review of previous notes and clinic visits, patient does have intermittent tachycardia, most likely related to deconditioning. Pt suffered from a significant CVA in 12/2013, leaving her with residual left-sided weakness, causing her difficulty w/ ambulation. This has improved significantly w/ PT, however, she still has weakness and poor exercise tolerance. EKG in the clinic showed rate of 96 bpm, atrial-sensed ventricular-paced rhythm. Does show new TWI in V5, V6, not present in EKG from 06/24/14. Patient denies chest pain, but does admit to infrequent dizziness and palpitations. HR has been in 80's since admission. Repeat EKG this AM shows atrial-paced rhythm of 84 bpm, no changes from previous. Troponins neg x 3. Cardiology was contacted and they interrogated the pacemaker. She was placed on telemetry during her hospitalization. HR has been stable in 80s.  3. HTN: Normotensive on admission, has remained normal. Renal artery duplex in 04/28/12 showed NO renal artery stenosis. Pt on amlodipine 5 mg daily, clonidine 0.2 mg BID, hydralazine 100 mg TID, metoprolol succinate 50 mg in AM, 25 mg in PM. Home meds were continued.   4. Type 2 DM: Glucose 220's on admission, now 129. Last HbA1c 8.3 on 07/03/14, significantly improved from 12.1 in 03/2014. Patient is on Metformin 1000 mg daily, Lantus 10 units in the morning and 35 units at bedtime, and Gabapentin 300 mg TID. Patient was placed on Lantus 30 units QHS and moderate ISS. Pt discharged on home regimen.  5. NICM w/  Chronic Diastolic and Systolic HF (s/p pacemaker placement 08/2013): Most recent echocardiogram in 01/14/14 shows EF of 35-40%. Patient denies any chest pain or shortness of breath. On amlodipine, clonidine, hydralazine, Toprol-XL, simvastatin 40 mg daily, digoxin 0.125 mg daily, ASA 325 mg daily and Lasix 40 mg daily. Troponins negative x 3. She was continued on her home medications.   6. HLD: Most recent lipid panel on 01/10/14 shows cholesterol 187, TG 194, HDL 43, LDL 105. On Simvastatin 40 mg daily at home. This was continued.    7. CAD: Questionable. LHC 05/2011 showing normal coronaries. Pt is followed at [facility], Dr. Shirlee Latch. She was continued on ASA and statin.    8. OSA: On CPAP at home, this was continued at night.   9. H/o CVA: CVA in January of 2015. Pt has residual left sided weakness in upper and lower extremities. 1/5 on exam. 5/5 on the right side. No focal deficits. CN II-XII grossly intact. She was continued on home medications as above.    Discharge Vitals:   BP 142/77  Pulse 87  Temp(Src) 97.9 F (36.6 C) (Oral)  Resp 17  Ht 5\' 9"  (1.753 m)  Wt 261 lb 14.4 oz (118.797 kg)  BMI 38.66 kg/m2  SpO2 100%  LMP 06/12/2014 Physical Exam  General: alert, cooperative, laying in bed, NAD  HEENT: /AT, EOMI, PERRL, conjunctiva normal, mucus membranes moist  Neck: Supple, no JVD  CV: RRR, normal S1/S2, no m/g/r  Pulm: CTA bilaterally, breaths nonlabored  Abd: BS+, soft, nontender, nondistended, no guarding, no suprapubic tenderness, no CVA tenderness  Ext: No peripheral edema, warm. Pt unable to move left arm and leg 2/2 stroke. Strength 5/5 in upper and lower right extremities, but 1/5 in upper and lower left extremities  Discharge Labs:  Results for orders placed during the hospital encounter of 07/04/14 (from the past 24 hour(s))  CBC     Status: Abnormal   Collection Time    07/04/14  9:00 PM      Result  Value Ref Range   WBC 10.9 (*) 4.0 - 10.5 K/uL   RBC  4.43  3.87 - 5.11 MIL/uL   Hemoglobin 12.3  12.0 - 15.0 g/dL   HCT 78.4  69.6 - 29.5 %   MCV 86.5  78.0 - 100.0 fL   MCH 27.8  26.0 - 34.0 pg   MCHC 32.1  30.0 - 36.0 g/dL   RDW 28.4  13.2 - 44.0 %   Platelets 291  150 - 400 K/uL  TROPONIN I     Status: None   Collection Time    07/04/14  9:00 PM      Result Value Ref Range   Troponin I <0.30  <0.30 ng/mL  GLUCOSE, CAPILLARY     Status: Abnormal   Collection Time    07/04/14  9:11 PM      Result Value Ref Range   Glucose-Capillary 151 (*) 70 - 99 mg/dL   Comment 1 Documented in Chart     Comment 2 Notify RN    URINALYSIS, ROUTINE W REFLEX MICROSCOPIC     Status: Abnormal   Collection Time    07/04/14 10:46 PM      Result Value Ref Range   Color, Urine YELLOW  YELLOW   APPearance CLOUDY (*) CLEAR   Specific Gravity, Urine 1.012  1.005 - 1.030   pH 5.5  5.0 - 8.0   Glucose, UA 250 (*) NEGATIVE mg/dL   Hgb urine dipstick NEGATIVE  NEGATIVE   Bilirubin Urine NEGATIVE  NEGATIVE   Ketones, ur NEGATIVE  NEGATIVE mg/dL   Protein, ur NEGATIVE  NEGATIVE mg/dL   Urobilinogen, UA 0.2  0.0 - 1.0 mg/dL   Nitrite NEGATIVE  NEGATIVE   Leukocytes, UA MODERATE (*) NEGATIVE  URINE MICROSCOPIC-ADD ON     Status: Abnormal   Collection Time    07/04/14 10:46 PM      Result Value Ref Range   Squamous Epithelial / LPF FEW (*) RARE   WBC, UA 21-50  <3 WBC/hpf   Bacteria, UA FEW (*) RARE   Casts HYALINE CASTS (*) NEGATIVE   Urine-Other MANY YEAST    TROPONIN I     Status: None   Collection Time    07/05/14  3:39 AM      Result Value Ref Range   Troponin I <0.30  <0.30 ng/mL  BASIC METABOLIC PANEL     Status: Abnormal   Collection Time    07/05/14  3:39 AM      Result Value Ref Range   Sodium 140  137 - 147 mEq/L   Potassium 3.5 (*) 3.7 - 5.3 mEq/L   Chloride 99  96 - 112 mEq/L   CO2 27  19 - 32 mEq/L   Glucose, Bld 123 (*) 70 - 99 mg/dL   BUN 9  6 - 23 mg/dL   Creatinine, Ser 1.02  0.50 - 1.10 mg/dL   Calcium 9.1  8.4 - 72.5 mg/dL     GFR calc non Af Amer >90  >90 mL/min   GFR calc Af Amer >90  >90 mL/min   Anion gap 14  5 - 15  CBC     Status: Abnormal   Collection Time    07/05/14  3:39 AM      Result Value Ref Range   WBC 11.4 (*) 4.0 - 10.5 K/uL   RBC 4.20  3.87 - 5.11 MIL/uL   Hemoglobin 11.5 (*) 12.0 - 15.0 g/dL   HCT 36.1  36.0 - 46.0 %   MCV 86.0  78.0 - 100.0 fL   MCH 27.4  26.0 - 34.0 pg   MCHC 31.9  30.0 - 36.0 g/dL   RDW 74.7  34.0 - 37.0 %   Platelets 282  150 - 400 K/uL  DIFFERENTIAL     Status: Abnormal   Collection Time    07/05/14  3:39 AM      Result Value Ref Range   Neutrophils Relative % 57  43 - 77 %   Neutro Abs 6.1  1.7 - 7.7 K/uL   Lymphocytes Relative 32  12 - 46 %   Lymphs Abs 3.4  0.7 - 4.0 K/uL   Monocytes Relative 10  3 - 12 %   Monocytes Absolute 1.1 (*) 0.1 - 1.0 K/uL   Eosinophils Relative 1  0 - 5 %   Eosinophils Absolute 0.1  0.0 - 0.7 K/uL   Basophils Relative 0  0 - 1 %   Basophils Absolute 0.0  0.0 - 0.1 K/uL  GLUCOSE, CAPILLARY     Status: Abnormal   Collection Time    07/05/14  6:19 AM      Result Value Ref Range   Glucose-Capillary 129 (*) 70 - 99 mg/dL  TROPONIN I     Status: None   Collection Time    07/05/14  8:15 AM      Result Value Ref Range   Troponin I <0.30  <0.30 ng/mL  GLUCOSE, CAPILLARY     Status: Abnormal   Collection Time    07/05/14 11:40 AM      Result Value Ref Range   Glucose-Capillary 205 (*) 70 - 99 mg/dL  DIFFERENTIAL     Status: None   Collection Time    07/05/14  2:59 PM      Result Value Ref Range   Neutrophils Relative % 47  43 - 77 %   Neutro Abs 4.1  1.7 - 7.7 K/uL   Lymphocytes Relative 43  12 - 46 %   Lymphs Abs 3.8  0.7 - 4.0 K/uL   Monocytes Relative 8  3 - 12 %   Monocytes Absolute 0.7  0.1 - 1.0 K/uL   Eosinophils Relative 2  0 - 5 %   Eosinophils Absolute 0.2  0.0 - 0.7 K/uL   Basophils Relative 0  0 - 1 %   Basophils Absolute 0.0  0.0 - 0.1 K/uL    Signed: Rich Number, MD 07/05/2014, 4:26 PM    Services  Ordered on Discharge: None Equipment Ordered on Discharge: None

## 2014-07-05 NOTE — Progress Notes (Signed)
Nutrition Brief Note  Patient identified on the Malnutrition Screening Tool (MST) Report for recent weight lost without trying and eating poorly because of a decreased appetite.  Per wt readings below, patient's weight has been stable.  Wt Readings from Last 15 Encounters:  07/05/14 261 lb 14.4 oz (118.797 kg)  07/04/14 268 lb 14.4 oz (121.972 kg)  07/03/14 264 lb 3.2 oz (119.84 kg)  06/24/14 280 lb (127.007 kg)  06/20/14 255 lb (115.667 kg)  06/06/14 268 lb (121.564 kg)  05/22/14 262 lb (118.842 kg)  05/14/14 262 lb (118.842 kg)  05/08/14 264 lb 6 oz (119.92 kg)  04/26/14 268 lb (121.564 kg)  04/12/14 260 lb 11.2 oz (118.253 kg)  03/28/14 262 lb 6.4 oz (119.024 kg)  03/25/14 265 lb 12.8 oz (120.566 kg)  02/19/14 267 lb (121.11 kg)  02/02/14 267 lb 3.2 oz (121.2 kg)    Body mass index is 38.66 kg/(m^2). Patient meets criteria for Obesity Class II based on current BMI.   Current diet order is Heart Healthy/Carbohydrate Modified, patient is consuming approximately 100% of meals at this time. Labs and medications reviewed.   No nutrition interventions warranted at this time. If nutrition issues arise, please consult RD.   Maureen Chatters, RD, LDN Pager #: [phone] After-Hours Pager #: [phone]

## 2014-07-05 NOTE — Progress Notes (Signed)
PT Cancellation Note  Patient Details Name: [name] MRN: [id number] DOB: 25-Jul-1966   Cancelled Treatment:    Reason Eval/Treat Not Completed: PT screened, no needs identified, will sign off (Per OT pt at baseline and can resume her OPPT after dc)   Keiston Manley 07/05/2014, 4:26 PM

## 2014-07-05 NOTE — Progress Notes (Signed)
Subjective: Patient presented from IM clinic with generalized weakness, right lower back pain/flank pain, and palpitations. This AM, she states she is feeling better compared to yesterday. She does complain of some right flank pain this morning. She was recently treated for UTI with 10 day course of Bactrim (completed on 07/04/14). Patient denies fever, chills, dysuria, hematuria, frequency, abdominal pain.   Objective: Vital signs in last 24 hours: Filed Vitals:   07/04/14 1910 07/04/14 2240 07/05/14 0340 07/05/14 0929  BP: 110/70  124/78 140/78  Pulse: 80 88 85 93  Temp: 97.7 F (36.5 C)  97.5 F (36.4 C)   TempSrc: Oral  Oral   Resp: 18 16 18    Height: 5\' 9"  (1.753 m)     Weight: 265 lb 10.5 oz (120.5 kg)  261 lb 14.4 oz (118.797 kg)   SpO2: 96% 96% 100%    Weight change:   Intake/Output Summary (Last 24 hours) at 07/05/14 1113 Last data filed at 07/05/14 0917  Gross per 24 hour  Intake    600 ml  Output   1201 ml  Net   -601 ml   Physical Exam General: alert, cooperative, laying in bed, NAD HEENT: [place]/AT, EOMI, PERRL, conjunctiva normal, mucus membranes moist Neck: Supple, no JVD CV: RRR, normal S1/S2, no m/g/r Pulm: CTA bilaterally, breaths nonlabored Abd: BS+, soft, nontender, nondistended, no guarding, no suprapubic tenderness, no CVA tenderness Ext: No peripheral edema, warm. Pt unable to move left arm and leg 2/2 stroke. Strength 5/5 in upper and lower right extremities, but 1/5 in upper and lower left extremities  Lab Results: Basic Metabolic Panel:  Recent Labs Lab 07/03/14 1524 07/05/14 0339  NA 133* 140  K 4.1 3.5*  CL 96 99  CO2 29 27  GLUCOSE 185* 123*  BUN 9 9  CREATININE 0.69 0.69  CALCIUM 9.9 9.1   Liver Function Tests:  Recent Labs Lab 07/03/14 1524  AST 12  ALT 11  ALKPHOS 66  BILITOT 0.6  PROT 7.5  ALBUMIN 4.4    Recent Labs Lab 07/03/14 1524  LIPASE 10   No results found for this basename: AMMONIA,  in the last 168  hours CBC:  Recent Labs Lab 07/03/14 1524 07/04/14 2100 07/05/14 0339  WBC 8.5 10.9* 11.4*  NEUTROABS 4.1  --   --   HGB 12.2 12.3 11.5*  HCT 37.1 38.3 36.1  MCV 82.1 86.5 86.0  PLT 323 291 282   Cardiac Enzymes:  Recent Labs Lab 07/04/14 2100 07/05/14 0339 07/05/14 0815  TROPONINI <0.30 <0.30 <0.30   BNP: No results found for this basename: PROBNP,  in the last 168 hours D-Dimer: No results found for this basename: DDIMER,  in the last 168 hours CBG:  Recent Labs Lab 07/03/14 1538 07/04/14 1816 07/04/14 2111 07/05/14 0619  GLUCAP 170* 223* 151* 129*   Hemoglobin A1C:  Recent Labs Lab 07/03/14 1546  HGBA1C 8.3   Fasting Lipid Panel: No results found for this basename: CHOL, HDL, LDLCALC, TRIG, CHOLHDL, LDLDIRECT,  in the last 168 hours Thyroid Function Tests: No results found for this basename: TSH, T4TOTAL, FREET4, T3FREE, THYROIDAB,  in the last 168 hours Coagulation: No results found for this basename: LABPROT, INR,  in the last 168 hours Anemia Panel: No results found for this basename: VITAMINB12, FOLATE, FERRITIN, TIBC, IRON, RETICCTPCT,  in the last 168 hours Urine Drug Screen: Drugs of Abuse     Component Value Date/Time   LABOPIA NONE DETECTED 01/09/2014 2030  COCAINSCRNUR NONE DETECTED 01/09/2014 2030   LABBENZ NONE DETECTED 01/09/2014 2030   AMPHETMU NONE DETECTED 01/09/2014 2030   THCU NONE DETECTED 01/09/2014 2030   LABBARB NONE DETECTED 01/09/2014 2030    Alcohol Level: No results found for this basename: ETH,  in the last 168 hours Urinalysis:  Recent Labs Lab 07/04/14 2246  COLORURINE YELLOW  LABSPEC 1.012  PHURINE 5.5  GLUCOSEU 250*  HGBUR NEGATIVE  BILIRUBINUR NEGATIVE  KETONESUR NEGATIVE  PROTEINUR NEGATIVE  UROBILINOGEN 0.2  NITRITE NEGATIVE  LEUKOCYTESUR MODERATE*    Micro Results: No results found for this or any previous visit (from the past 240 hour(s)). Studies/Results: No results found. Medications: I have  reviewed the patient's current medications. Scheduled Meds: . amLODipine  5 mg Oral Daily  . aspirin  325 mg Oral Daily  . baclofen  5 mg Oral TID  . carbamazepine  200 mg Oral BID  . cloNIDine  0.2 mg Oral BID  . digoxin  0.125 mg Oral Daily  . enoxaparin (LOVENOX) injection  60 mg Subcutaneous Q24H  . furosemide  40 mg Oral Daily  . gabapentin  300 mg Oral TID  . hydrALAZINE  100 mg Oral TID  . insulin aspart  0-15 Units Subcutaneous TID WC  . insulin glargine  30 Units Subcutaneous QHS  . metoprolol succinate  25 mg Oral QPM  . metoprolol succinate  50 mg Oral q morning - 10a  . nystatin   Topical BID  . pantoprazole  40 mg Oral Daily  . simvastatin  40 mg Oral Daily  . sodium chloride  3 mL Intravenous Q12H   Continuous Infusions:  PRN Meds:.cyclobenzaprine, oxyCODONE-acetaminophen Assessment/Plan: [name] is a 48yo woman w/ PMHx of HTN, DM-II, NICM and LBBB s/p CRT-D implantation, recent hospitalization for UTI 06/24/14 who presented to the floor from the [facility] clinic for tachycardia and generalized weakness.  1. Generalized Weakness: Patient complained of generalized weakness and feeling "run-down" since her recent admission on 06/24/14 for E.Coli UTI. She completed her 10 day course of Bactrim. She denies dysuria, hematuria, frequency, fever, and chills. She does complain of mild right flank pain but does not have suprapubic or CVA tenderness on exam. Her WBC was 10.9 on admission, now 11.4. Pt admits to recent infrequent palpitations and shown to have intermittent tachycardia at home (see below). U/A shows moderate leukocytes. Pt received 1 dose of Rocephin. Pt states her blood sugars have been well controlled, HbA1c 8.3 on 07/03/14. Weakness likely 2/2 resolving UTI vs. Deconditioning in setting of baseline left-sided residual weakness from previous stroke. - f/u urine cultures - f/u differential from CBC - f/u PT/OT evaluation  2. Tachycardia- Resolved: Reason for concern from  [facility] clinic yesterday. Some concern involving pacemaker, however, may also be related to resolving or new onset infection, or deconditioning. On review of previous notes and clinic visits, patient does have intermittent tachycardia, most likely related to deconditioning. Pt suffered from a significant CVA in 12/2013, leaving her with residual left-sided weakness, causing her difficulty w/ ambulation. This has improved significantly w/ PT, however, she still has weakness and poor exercise tolerance. EKG in the clinic showed rate of 96 bpm, atrial-sensed ventricular-paced rhythm. Does show new TWI in V5, V6, not present in EKG from 06/24/14. Patient denies chest pain, but does admit to infrequent dizziness and palpitations. HR has been in 80's since admission. Repeat EKG this AM shows atrial-paced rhythm of 84 bpm, no changes from previous. Troponins neg x 3. - Cards  master contacted --> will interrogate pacemaker - Continue Telemetry    3. HTN: Normotensive on admission, has remained normal. Renal artery duplex in 04/28/12 showed NO renal artery stenosis. Pt on amlodipine 5 mg daily, clonidine 0.2 mg BID, hydralazine 100 mg TID, metoprolol succinate 50 mg in AM, 25 mg in PM.  - Continue home meds  4. Type 2 DM: Glucose 220's on admission, now 129. Last HbA1c 8.3 on 07/03/14, significantly improved from 12.1 in 03/2014. Patient is on Metformin 1000 mg daily, Lantus 10 units in the morning and 35 units at bedtime, and Gabapentin 300 mg TID.  - Lantus 30 units qhs for now  - Moderate SSI   5. 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 Patient denies any chest pain or shortness of breath. On amlodipine, clonidine, hydralazine, Toprol-XL, simvastatin 40 mg daily, digoxin 0.125 mg daily, ASA 325 mg daily and Lasix 40 mg daily. Troponins negative x 3. - Continue home medications   6. HLD:  Most recent lipid panel on 01/10/14 shows cholesterol 187, TG 194, HDL 43, LDL 105. On Simvastatin 40 mg daily at home.  - Continue Simvastatin 40 mg daily   7. CAD: Questionable. LHC 05/2011 showing normal coronaries. Pt is followed at [facility], Dr. Shirlee Latch.  - Continue ASA and statin   8. OSA: On CPAP at home  - Continue CPAP qhs   9. H/o CVA: CVA in January of 2015. Pt has residual left sided weakness in upper and lower extremities. 1/5 on exam. 5/5 on the right side. No focal deficits. CN II-XII grossly intact.  - Continue home medications as above    Diet: Heart healthy/ Carb modified DVT/PE PPx: Lovenox SQ Dispo: Likely discharge today   The patient does have a current PCP Courtney Paris, MD) and does need an [facility] hospital follow-up appointment after discharge.  The patient does not have transportation limitations that hinder transportation to clinic appointments.  .Services Needed at time of discharge: Y = Yes, Blank = No PT:   OT:   RN:   Equipment:   Other:     LOS: 1 day   Rich Number, MD 07/05/2014, 11:13 AM

## 2014-07-05 NOTE — Evaluation (Signed)
Occupational Therapy Evaluation Patient Details Name: [name] MRN: [id number] DOB: May 04, 1966 Today's Date: 07/05/2014    History of Present Illness Patient presented from IM clinic with generalized weakness, right lower back pain/flank pain, and palpitations.  Pt recently diagnosed with UTI.  Work up underway   Clinical Impression   Patient evaluated by Occupational Therapy with no further acute OT needs identified. All education has been completed and the patient has no further questions. Pt is at baseline level of functioning.  She has no active ROM Lt. UE and flexor synergies and spasticity present.  She is independent with self ROM, and has been discharged from OPOT.  She is scheduled for Botox injections next month per pt.  See below for any follow-up Occupational Therapy or equipment needs. OT is signing off. Thank you for this referral.      Follow Up Recommendations  No OT follow up;Supervision/Assistance - 24 hour    Equipment Recommendations  None recommended by OT    Recommendations for Other Services       Precautions / Restrictions Restrictions Weight Bearing Restrictions: No      Mobility Bed Mobility Overal bed mobility: Modified Independent                Transfers Overall transfer level: Modified independent Equipment used: Quad cane                  Balance Overall balance assessment: Modified Independent (Pt is at baseline)                                          ADL Overall ADL's : At baseline                                       General ADL Comments: Pt donned AFO and shoes, performs toilet transfer and toileting mod I.   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  She demonstrates flexor synergies Lt. UE.  No active movement noted.  She demonsgtrates PROM WFL, but moderate flexor spasticity.  She is independent with self ROM and reports she is scheduled for Botox next month LUE Coordination: decreased fine motor;decreased gross motor   Lower Extremity Assessment Lower Extremity Assessment: Defer to PT evaluation       Communication Communication Communication: No difficulties   Cognition Arousal/Alertness: Awake/alert Behavior During Therapy: WFL for tasks assessed/performed Overall Cognitive Status: Within Functional Limits for tasks assessed                     General Comments       Exercises       Shoulder Instructions      Home Living Family/patient expects to be discharged to:: Private residence Living Arrangements: Children Available Help at Discharge: Family;Available PRN/intermittently Type of Home: House Home Access: Stairs to enter [employer] of Steps:  2 Entrance Stairs-Rails: None Home Layout: One level     Bathroom Shower/Tub: Tub/shower unit;Curtain Shower/tub characteristics: Engineer, building services: Handicapped height Bathroom Accessibility: Yes How Accessible: Accessible via walker Home Equipment: Cane - quad;Shower seat;Hospital bed   Additional Comments: Lives with daughter and son, ages 57 and 15 years old. Daughter works PT, son doesn't work, and personal care nurse 2.5 hours/day, 48 days per week to assist with medication, cooking, cleaning, bathing/dressing due to pt h/o falling after placement of pacemaker.      Prior Functioning/Environment Level of Independence: Needs assistance  Gait / Transfers Assistance Needed: ambulates with mod I using quad cane and AFL on Lt. LE.  Pt is currently receiving OPPT 1x/wk ADL's / Homemaking Assistance Needed: Pt is able to dress mod I.  Has assist with  bathing.  performs grooming and toileting mod I   Comments: Pt reports she has a hospital bed, but recently stopped using it and has transitioned back to normal bed    OT Diagnosis:     OT Problem List:     OT Treatment/Interventions:      OT Goals(Current goals can be found in the care plan section)    OT Frequency:     Barriers to D/C:            Co-evaluation              End of Session Equipment Utilized During Treatment: Other (comment) (quad cane) Nurse Communication: Mobility status  Activity Tolerance: Patient tolerated treatment well Patient left: in chair;with call bell/phone within reach   Time: 1115-1132 OT Time Calculation (min): 17 min Charges:  OT General Charges $OT Visit: 1 Procedure OT Evaluation $Initial OT Evaluation Tier I: 1 Procedure OT Treatments $Self Care/Home Management : 8-22 mins G-Codes:    Wyland Rastetter M 2014-07-24, 12:27 PM

## 2014-07-05 NOTE — Progress Notes (Signed)
Occupational Therapy Evaluation Addendum:   2014/07/27 1200  OT G-codes **NOT FOR INPATIENT CLASS**  Functional Limitation Self care  Self Care Current Status [phone]) CI  Self Care Goal Status (J1914) CI  Self Care Discharge Status [phone]) CI  Jeani Hawking, OTR/L [phone]

## 2014-07-05 NOTE — Progress Notes (Signed)
07/05/2014 5:51 PM Nursing note Discharge avs form, medications already taken today and those due this evening given and explained to patient. Follow up appointments and when to call md reviewed. D/c iv line. D/c tele. D/c home with family per orders.  Theodosia Bahena, Blanchard Kelch

## 2014-07-05 NOTE — H&P (Signed)
INTERNAL MEDICINE TEACHING ATTENDING NOTE  Day 1 of stay  Patient name: [name]  MRN: [id number] Date of birth: 09/16/1966   48 y.o. with generalized weakness, palpitations and tachycardia. Past medical history significant for HTN, DM-II, NICM and LBBB s/p CRT-D implantation, recent hospitalization for UTI. She denies any urinary complaints although says that she has suprapubic pain and flank pain. She denies any bowel problems. She denies chest pain or abdominal pain.   Exam today, she feels well. Vitals are stable. She is afebrile. I met with her and examined her this morning. She has regular cardiac rate and rhythm, no murmurs, lungs are clear to auscultation. She does not have any chest tenderness. Abdomen is soft, non-tender, non-distended, with normal BS. She does not have flank tenderness.  She is alert and oriented, and has no focal neurological deficits.  I have reviewed the chart, lab results, EKG, imaging and relevant notes of this patient.   Assessment and Plan                                                                       I agree with the plan outlined by Dr Richardson. We will await cardiology evaluation of CRT device. The patient's HR has been in 80-120s bpm during this admission. Her feeling of ill-being corresponds with her palpitations.     The other issue is her mild leucocytosis, which shows an increasing trend. She has positive leukocytes in urine, but she has no significant urologic complaints. We will follow up the white count as outpatient. Hold off antibiotics for now - she just completed a course of bactrim. We will reassess if the patient spikes a fever or shows other signs of infection.   Rest per resident note.   I have seen and evaluated this patient and discussed it with my IM resident team.  Please see the rest of the plan per resident note from today.   BHARDWAJ, SHILPA 07/05/2014, 1:24 PM.    

## 2014-07-06 LAB — URINE CULTURE

## 2014-07-08 ENCOUNTER — Other Ambulatory Visit: Payer: Self-pay | Admitting: Internal Medicine

## 2014-07-08 NOTE — Discharge Summary (Signed)
INTERNAL MEDICINE ATTENDING DISCHARGE COSIGN   I evaluated the patient on the day of discharge and discussed the discharge plan with my resident team. I agree with the discharge documentation and disposition.   Aletta Edouard 07/08/2014, 3:27 PM

## 2014-07-09 ENCOUNTER — Ambulatory Visit: Payer: Medicaid Other

## 2014-07-09 NOTE — Telephone Encounter (Signed)
Will address at next visit 

## 2014-07-10 ENCOUNTER — Ambulatory Visit: Payer: Medicaid Other | Admitting: Internal Medicine

## 2014-07-11 ENCOUNTER — Other Ambulatory Visit: Payer: Self-pay

## 2014-07-11 MED ORDER — FLUTICASONE PROPIONATE 0.05 % EX CREA
TOPICAL_CREAM | Freq: Two times a day (BID) | CUTANEOUS | Status: DC
Start: 2014-07-11 — End: 2015-02-24

## 2014-07-12 ENCOUNTER — Ambulatory Visit: Payer: Medicaid Other

## 2014-07-12 DIAGNOSIS — IMO0001 Reserved for inherently not codable concepts without codable children: Secondary | ICD-10-CM | POA: Diagnosis not present

## 2014-07-15 ENCOUNTER — Encounter: Payer: Self-pay | Admitting: Internal Medicine

## 2014-07-16 ENCOUNTER — Telehealth: Payer: Self-pay

## 2014-07-16 ENCOUNTER — Ambulatory Visit: Payer: Medicaid Other | Attending: Internal Medicine

## 2014-07-16 DIAGNOSIS — R279 Unspecified lack of coordination: Secondary | ICD-10-CM | POA: Insufficient documentation

## 2014-07-16 DIAGNOSIS — Z8673 Personal history of transient ischemic attack (TIA), and cerebral infarction without residual deficits: Secondary | ICD-10-CM | POA: Insufficient documentation

## 2014-07-16 DIAGNOSIS — IMO0001 Reserved for inherently not codable concepts without codable children: Secondary | ICD-10-CM | POA: Insufficient documentation

## 2014-07-16 MED ORDER — OXYCODONE-ACETAMINOPHEN 10-325 MG PO TABS: 1.0000 | ORAL_TABLET | Freq: Three times a day (TID) | ORAL | Status: DC | PRN

## 2014-07-16 NOTE — Telephone Encounter (Signed)
Contacted patient to let her know the Oxycodone RX is ready for pick up.

## 2014-07-16 NOTE — Telephone Encounter (Signed)
Patient is requesting a refill on Oxycodone. Last fill date 7/9. Patient's next appt is 8/18. RX printed for Dr. Riley Kill to sign. Will contact patient when ready for pick up.

## 2014-07-17 ENCOUNTER — Encounter: Payer: Self-pay | Admitting: Physical Medicine & Rehabilitation

## 2014-07-18 NOTE — Telephone Encounter (Signed)
Scans of prescriptions and orders should not be placed in "Results" section of Epic----please make sure they are placed in another folder  (see sling for Mrs. [name])

## 2014-07-23 ENCOUNTER — Ambulatory Visit (INDEPENDENT_AMBULATORY_CARE_PROVIDER_SITE_OTHER): Payer: Medicaid Other | Admitting: Internal Medicine

## 2014-07-23 ENCOUNTER — Encounter: Payer: Self-pay | Admitting: Internal Medicine

## 2014-07-23 VITALS — BP 91/59 | HR 108 | Temp 98.8°F | Ht 69.0 in | Wt 266.2 lb

## 2014-07-23 DIAGNOSIS — I959 Hypotension, unspecified: Secondary | ICD-10-CM | POA: Insufficient documentation

## 2014-07-23 DIAGNOSIS — R000 Tachycardia, unspecified: Secondary | ICD-10-CM

## 2014-07-23 DIAGNOSIS — N390 Urinary tract infection, site not specified: Secondary | ICD-10-CM

## 2014-07-23 NOTE — Assessment & Plan Note (Signed)
Patient still with mild tachycardia today, however, I am more inclined to believe this is in the setting of hypotension in addition to deconditioning. [name] is scheduled for CHF clinic follow up on 08/07/14 and w/ Dr. Graciela Husbands on 08/21/14.

## 2014-07-23 NOTE — Assessment & Plan Note (Signed)
BP Readings from Last 3 Encounters:  07/23/14 91/59  07/05/14 142/77  07/04/14 137/89    Lab Results  Component Value Date   NA 140 07/05/2014   K 3.5* 07/05/2014   CREATININE 0.69 07/05/2014    Assessment: Comments: Blood pressure significantly low today on exam, much different from her normal values. [name] takes Toprol-XL 50 mg in AM, 25 mg in PM, Hydralazine 100 mg tid, Lasix 40 mg qd, Clonidine 0.2 mg bid, and Norvasc 5 mg qd. Typically her blood pressure is well controlled, but not low. On exam, the patient appeared hypotensive, complaining of lightheadedness, diaphoresis, and was cool and clammy. Laid the patient down for a bit and her symptoms significantly improved. Orthostatics were negative. Patient does claim to have worsening diarrhea recently, which may be contributing to some hypovolemia, however, it may also be possible that her HTN is being over-treated at this time.   Plan: Medications:  Instructed patient to HOLD all BP medications for tonight (Toprol-XL 25 mg, Clonidine 0.2 mg, Hydralazine 100 mg) and tomorrow morning (Toprol-XL 50 mg, Clonidine 0.2 mg, Hydralazine 100 mg, and Lasix 40 mg).  Educational resources provided: brochure Self management tools provided:   Other plans: Patient to return for follow up tomorrow. At that time, may need to decrease or HOLD some of her BP meds to better manage her blood pressure without resulting in hypotension.  -Would also advise repeating orthostatics if it is felt that she is hypovolemic in the setting of diarrhea.

## 2014-07-23 NOTE — Patient Instructions (Signed)
1. Please schedule a follow up appointment for TOMORROW AFTERNOON.  2. Please HOLD YOUR BLOOD PRESSURE MEDS FOR TONIGHT AND TOMORROW MORNING.  3. If you have worsening of your symptoms or new symptoms arise, please call the clinic ([phone]), or go to the ER immediately if symptoms are severe.  You have done a great job in taking all your medications. I appreciate it very much. Please continue doing that.

## 2014-07-23 NOTE — Assessment & Plan Note (Signed)
Resolved. Patient no longer complaining of dysuria or increased urinary frequency. Her previous urine culture showed to have no growth.

## 2014-07-23 NOTE — Progress Notes (Signed)
Subjective:   Patient ID: [name] female   DOB: [birth year] 48 y.o.   MRN: [id number]  HPI: [name] is a 48 y.o. female w/ PMHx of HTN, HLD, uncontrolled DM type II, OSA, CAD, CHF, and h/o CVA w/ left-sided residual weakness, presents to the clinic today for a hospital follow-up visit. The patient claims she has been doing well lately aside from some diarrhea that has been an ongoing issue for her but she says has been worse over the past 3-4 days. She denies any other significant complaints, no dysuria, chest pain, SOB, nausea, or abdominal pain. During her visit however, [name] was hypotensive, much different from her normal blood pressure which is typically elevated. Initially, her BP was 91/59 and the patient said she was feeling hot and flushed, which progressed to her feeling nauseous, lightheaded, and she then appeared diaphoretic and slightly clammy on exam. BP was rechecked and shown to be 80's/60's, and tachycardic, however this is not abnormal for her. She claims her BP was high this AM to 170/107, at which time she took her meds as instructed (Lasix, Toprol-XL, Hydralazine, Clonidine, Norvasc). She typically does not have these symptoms of low blood pressure after taking her usual medications. She denies any other issues; no loss of consciousness, recent falls, seizures, change in mental status, or confusion.   Past Medical History  Diagnosis Date  . Hypertension     Poorly controlled. Has had HTN since age 67. Angioedema with ACEI.  24 Hr urine and renal arterial dopplers ordered . . . Never done  . NICM (nonischemic cardiomyopathy)     EF 45-50% in 8/12, cath 6/12 showed normal coronaries, EF 50-55% by LV gram  . Morbid obesity   . Allergic rhinitis   . HLD (hyperlipidemia)   . OSA on CPAP     sleep study in 8/12 showed moderate to severe OSA requiring CPAP  . Chronic lower back pain     secondary to DJD, obsetiy, hip problems. Followed by Dr. Ivory Broad  (pain management)  . Chronic diastolic heart failure      Primarily diastolic CHF: Likely due to uncontrolled HTN. Last echo (8/12) with EF 45-50%, mild to moderate LVH with some asymmetric septal hypertrophy, RV normal size and systolic function. EF 50-55% by LV-gram in 6/12.   . Polyneuropathy in diabetes(357.2)   . Thoracic or lumbosacral neuritis or radiculitis, unspecified   . Calcifying tendinitis of shoulder   . LBBB (left bundle branch block)   . Chronic combined systolic and diastolic CHF (congestive heart failure)     EF 40-45% by echo 12/06/2012  . Coronary artery disease     questionable. LHC 05/2011 showing normal coronaries // Followed at [facility], Dr. Shirlee Latch  . GERD (gastroesophageal reflux disease)   . Shortness of breath   . Asthma   . Automatic implantable cardioverter-defibrillator in situ   . Stroke 12/2013    "my left side is paralyzed" (07/04/2014)  . Type II diabetes mellitus DX: 2002  . 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 Degeneration of lumbar or lumbosacral intervertebral disc   . Arthritis     "hips, back, legs, arms" (07/04/2014)  . Frequent UTI    Current Outpatient Prescriptions  Medication Sig Dispense Refill  . amLODipine (NORVASC) 5 MG tablet Take 1 tablet (5 mg total) by mouth daily.  30 tablet  3  . aspirin 325 MG tablet Take  1 tablet (325 mg total) by mouth daily.  30 tablet  5  . baclofen (LIORESAL) 10 MG tablet Take 0.5-1 tablets (5-10 mg total) by mouth 3 (three) times daily.  90 tablet  3  . carbamazepine (TEGRETOL) 200 MG tablet Take 200 mg by mouth 2 (two) times daily.      . cloNIDine (CATAPRES) 0.2 MG tablet Take 0.2 mg by mouth 2 (two) times daily.      . cyclobenzaprine (FLEXERIL) 10 MG tablet Take 10 mg by mouth 3 (three) times daily as needed for muscle spasms.      . diclofenac sodium (VOLTAREN) 1 % GEL Apply 2 g topically 4 (four) times daily as needed (pain).  3 Tube  5  . digoxin (LANOXIN) 0.125 MG  tablet Take 0.125 mg by mouth daily.      . fluticasone (CUTIVATE) 0.05 % cream Apply topically 2 (two) times daily. Dispense two tubes  30 g  6  . furosemide (LASIX) 40 MG tablet Take 40 mg by mouth daily.       [name] gabapentin (NEURONTIN) 300 MG capsule Take 1 capsule (300 mg total) by mouth 3 (three) times daily. Day 1: 300 mg, Day 2: 300 mg twice daily, Day 3: 300 mg 3 times daily  60 capsule  0  . hydrALAZINE (APRESOLINE) 100 MG tablet Take 100 mg by mouth 3 (three) times daily.      [name] ibuprofen (ADVIL,MOTRIN) 200 MG tablet Take 400 mg by mouth 3 (three) times daily as needed for mild pain.      [name] insulin glargine (LANTUS) 100 UNIT/ML injection Inject 10-35 Units into the skin 2 (two) times daily. 10 units in am, 35 units in pm      . metFORMIN (GLUCOPHAGE) 1000 MG tablet Take 1 tablet (1,000 mg total) by mouth daily with breakfast.  30 tablet  11  . metoprolol succinate (TOPROL-XL) 25 MG 24 hr tablet Take 50 mg (2 tablets) in the morning and 25 mg (1 tablet) in the evening.  90 tablet  3  . oxyCODONE-acetaminophen (PERCOCET) 10-325 MG per tablet Take 1 tablet by mouth every 8 (eight) hours as needed for pain.  90 tablet  0  . pantoprazole (PROTONIX) 40 MG tablet Take 40 mg by mouth daily.      . promethazine (PHENERGAN) 12.5 MG tablet Take 1 tablet (12.5 mg total) by mouth every 6 (six) hours as needed for nausea or vomiting.  30 tablet  0  . simvastatin (ZOCOR) 40 MG tablet Take 40 mg by mouth at bedtime.        No current facility-administered medications for this visit.   Family History  Problem Relation Age of Onset  . Heart disease Mother 58    Died of MI at age 77 yo  . Heart disease Paternal Grandmother     requiring pacemaker.  [name] Heart disease Paternal Grandfather 87    Died of MI at possibly age 52-53yo  . Stroke Paternal Grandfather   . Heart disease Father 32    MI age 69yo requiring stenting  . Kidney disease Mother     requiring dialysis  . Congestive Heart Failure Mother    . Diabetes Father   . Diabetes Brother   . Heart disease Brother 55    MI at age 51 years old  . Breast cancer Paternal Aunt   . Breast cancer Maternal Grandmother    History   Social History  . Marital Status: Single  Spouse Name: N/A    Number of Children: 2  . Years of Education: 9th grade   Occupational History  . Unemployed     planning on getting disability   Social History Main Topics  . Smoking status: Never Smoker   . Smokeless tobacco: Never Used  . Alcohol Use: No  . Drug Use: No  . Sexual Activity: Yes    Birth Control/ Protection: Surgical   Other Topics Concern  . None   Social History Narrative   Lives in [place] with her son. Is able to read and write fluently in [place].    Review of Systems: General: Denies fever, chills, diaphoresis, appetite change and fatigue.  Respiratory: Denies SOB, DOE, cough, chest tightness, and wheezing.   Cardiovascular: Denies chest pain and palpitations.  Gastrointestinal: Denies nausea, vomiting, abdominal pain, diarrhea, constipation, blood in stool and abdominal distention.  Genitourinary: Denies dysuria, urgency, frequency, hematuria, and flank pain. Endocrine: Denies hot or cold intolerance, polyuria, and polydipsia. Musculoskeletal: Positive for gait problem. Denies myalgias, back pain, joint swelling, arthralgias..  Skin: Positive for left axillary hyperpigmentation. Denies pallor, rash and wounds.  Neurological: Positive for left-sided residual weakness, LUE weakness>LLE weakness. Denies dizziness, seizures, syncope, weakness, lightheadedness, numbness and headaches.  Psychiatric/Behavioral: Denies mood changes, confusion, nervousness, sleep disturbance and agitation.  Objective:   Physical Exam: Filed Vitals:   07/23/14 1612  BP: 91/59  Pulse: 108  Temp: 98.8 F (37.1 C)  TempSrc: Oral  Height: 5\' 9"  (1.753 m)  Weight: 266 lb 3.2 oz (120.748 kg)  SpO2: 98%   General: Vital signs reviewed.   Patient is an obese female, in no acute distress and cooperative with exam. Appears slightly diaphoretic, flushed. Clammy on exam.  Head: Normocephalic and atraumatic. Left-sided lower facial droop.  Eyes: PERRL, EOMI, conjunctivae normal, No scleral icterus.  Neck: Supple, trachea midline, normal ROM, No JVD, masses, thyromegaly, or carotid bruit present.  Cardiovascular: Tachycardic, regular rhythm, S1 normal, S2 normal, no murmurs, gallops, or rubs. Pulmonary/Chest: Air entry equal bilaterally, no wheezes, rales, or rhonchi. Abdominal: Soft, non-tender, non-distended, BS +, no masses, organomegaly, or guarding present.  Musculoskeletal: Left arm weakness and hand swelling. No pain w/ passive ROM. No joint deformities, erythema, or stiffness. Extremities: Pulses symmetric and intact bilaterally. No cyanosis or clubbing. Neurological: A&O x3. Left-sided facial droop and weakness. Otherwise, CN's intact. 1/5 strength in LUE, 4/5 in LLE. Otherwise strength intact. Sensory abnormalities noted over the LUE.  Skin: Cool, clammy, intact. No rashes or erythema. Hyperpigmentation in the left axilla.  Psychiatric: Normal mood and affect. speech and behavior is normal. Cognition and memory are normal.   Assessment & Plan:   Please see problem based assessment and plan.

## 2014-07-24 ENCOUNTER — Ambulatory Visit (INDEPENDENT_AMBULATORY_CARE_PROVIDER_SITE_OTHER): Payer: Medicaid Other | Admitting: Internal Medicine

## 2014-07-24 ENCOUNTER — Encounter: Payer: Self-pay | Admitting: Internal Medicine

## 2014-07-24 VITALS — BP 155/101 | HR 108 | Temp 98.2°F | Ht 69.0 in | Wt 266.3 lb

## 2014-07-24 DIAGNOSIS — R5381 Other malaise: Secondary | ICD-10-CM

## 2014-07-24 DIAGNOSIS — R5382 Chronic fatigue, unspecified: Secondary | ICD-10-CM

## 2014-07-24 DIAGNOSIS — R5383 Other fatigue: Secondary | ICD-10-CM

## 2014-07-24 DIAGNOSIS — I959 Hypotension, unspecified: Secondary | ICD-10-CM

## 2014-07-24 DIAGNOSIS — R531 Weakness: Secondary | ICD-10-CM

## 2014-07-24 DIAGNOSIS — T421X5A Adverse effect of iminostilbenes, initial encounter: Secondary | ICD-10-CM

## 2014-07-24 DIAGNOSIS — R42 Dizziness and giddiness: Secondary | ICD-10-CM

## 2014-07-24 DIAGNOSIS — Z0000 Encounter for general adult medical examination without abnormal findings: Secondary | ICD-10-CM

## 2014-07-24 LAB — GLUCOSE, CAPILLARY: GLUCOSE-CAPILLARY: 152 mg/dL — AB (ref 70–99)

## 2014-07-24 MED ORDER — ISOSORBIDE DINITRATE 20 MG PO TABS: 20.0000 mg | ORAL_TABLET | Freq: Three times a day (TID) | ORAL | Status: DC

## 2014-07-24 NOTE — Progress Notes (Signed)
Case discussed with Dr. Gill at the time of the visit.  We reviewed the resident's history and exam and pertinent patient test results.  I agree with the assessment, diagnosis and plan of care documented in the resident's note. 

## 2014-07-24 NOTE — Assessment & Plan Note (Signed)
Performed medication management today.  -needs pap smear -needs vaccines, but apparently has declined this in the past -at next OV ask about mammogram

## 2014-07-24 NOTE — Progress Notes (Signed)
I saw and evaluated the patient.  I personally confirmed the key portions of Dr. Jones' history and exam and reviewed pertinent patient test results.  The assessment, diagnosis, and plan were formulated together and I agree with the documentation in the resident's note. 

## 2014-07-24 NOTE — Progress Notes (Signed)
Patient ID: [name], female   DOB: 03/05/1966, 48 y.o.   MRN: [id number]    Subjective:   Patient ID: [name] female    DOB: August 25, 1966 48 y.o.    MRN: [id number] Health Maintenance Due: Health Maintenance Due  Topic Date Due  . Pneumococcal Polysaccharide Vaccine (##1) 08/28/1968  . Pap Smear  08/28/1984  . Tetanus/tdap  08/28/1985  . Influenza Vaccine  07/13/2014    _________________________________________________  HPI: [name] is a 48 y.o. female here for an acute visit for BP recheck.  Pt has a PMH outlined below.  Please see problem-based charting assessment and plan note for further details of medical issues addressed at today's visit.  PMH: Past Medical History  Diagnosis Date  . Hypertension     Poorly controlled. Has had HTN since age 34. Angioedema with ACEI.  24 Hr urine and renal arterial dopplers ordered . . . Never done  . NICM (nonischemic cardiomyopathy)     EF 45-50% in 8/12, cath 6/12 showed normal coronaries, EF 50-55% by LV gram  . Morbid obesity   . Allergic rhinitis   . HLD (hyperlipidemia)   . OSA on CPAP     sleep study in 8/12 showed moderate to severe OSA requiring CPAP  . Chronic lower back pain     secondary to DJD, obsetiy, hip problems. Followed by Dr. Ivory Broad (pain management)  . Chronic diastolic heart failure      Primarily diastolic CHF: Likely due to uncontrolled HTN. Last echo (8/12) with EF 45-50%, mild to moderate LVH with some asymmetric septal hypertrophy, RV normal size and systolic function. EF 50-55% by LV-gram in 6/12.   . Polyneuropathy in diabetes(357.2)   . Thoracic or lumbosacral neuritis or radiculitis, unspecified   . Calcifying tendinitis of shoulder   . LBBB (left bundle branch block)   . Chronic combined systolic and diastolic CHF (congestive heart failure)     EF 40-45% by echo 12/06/2012  . Coronary artery disease     questionable. LHC 05/2011 showing normal coronaries // Followed at  [facility], Dr. Shirlee Latch  . GERD (gastroesophageal reflux disease)   . Shortness of breath   . Asthma   . Automatic implantable cardioverter-defibrillator in situ   . Stroke 12/2013    "my left side is paralyzed" (07/04/2014)  . Type II diabetes mellitus DX: 2002  . 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 Degeneration of lumbar or lumbosacral intervertebral disc   . Arthritis     "hips, back, legs, arms" (07/04/2014)  . Frequent UTI     Medications: Current Outpatient Prescriptions on File Prior to Visit  Medication Sig Dispense Refill  . amLODipine (NORVASC) 5 MG tablet Take 1 tablet (5 mg total) by mouth daily.  30 tablet  3  . aspirin 325 MG tablet Take 1 tablet (325 mg total) by mouth daily.  30 tablet  5  . carbamazepine (TEGRETOL) 200 MG tablet Take 200 mg by mouth 2 (two) times daily.      . cloNIDine (CATAPRES) 0.2 MG tablet Take 0.2 mg by mouth 2 (two) times daily.      . cyclobenzaprine (FLEXERIL) 10 MG tablet Take 10 mg by mouth 3 (three) times daily as needed for muscle spasms.      . diclofenac sodium (VOLTAREN) 1 % GEL Apply 2 g topically 4 (four) times daily as needed (pain).  3 Tube  5  .  digoxin (LANOXIN) 0.125 MG tablet Take 0.125 mg by mouth daily.      . fluticasone (CUTIVATE) 0.05 % cream Apply topically 2 (two) times daily. Dispense two tubes  30 g  6  . furosemide (LASIX) 40 MG tablet Take 40 mg by mouth daily.       [name] gabapentin (NEURONTIN) 300 MG capsule Take 1 capsule (300 mg total) by mouth 3 (three) times daily. Day 1: 300 mg, Day 2: 300 mg twice daily, Day 3: 300 mg 3 times daily  60 capsule  0  . hydrALAZINE (APRESOLINE) 100 MG tablet Take 100 mg by mouth 3 (three) times daily.      [name] ibuprofen (ADVIL,MOTRIN) 200 MG tablet Take 400 mg by mouth 3 (three) times daily as needed for mild pain.      [name] insulin glargine (LANTUS) 100 UNIT/ML injection Inject 10-35 Units into the skin 2 (two) times daily. 10 units in am, 35 units in pm      .  metFORMIN (GLUCOPHAGE) 1000 MG tablet Take 1 tablet (1,000 mg total) by mouth daily with breakfast.  30 tablet  11  . metoprolol succinate (TOPROL-XL) 25 MG 24 hr tablet Take 50 mg (2 tablets) in the morning and 25 mg (1 tablet) in the evening.  90 tablet  3  . oxyCODONE-acetaminophen (PERCOCET) 10-325 MG per tablet Take 1 tablet by mouth every 8 (eight) hours as needed for pain.  90 tablet  0  . pantoprazole (PROTONIX) 40 MG tablet Take 40 mg by mouth daily.      . simvastatin (ZOCOR) 40 MG tablet Take 40 mg by mouth at bedtime.        No current facility-administered medications on file prior to visit.    Allergies: Allergies  Allergen Reactions  . Clindamycin Anaphylaxis and Swelling  . Ace Inhibitors Swelling  . Enalapril Maleate Swelling  . Lisinopril Swelling    FH: Family History  Problem Relation Age of Onset  . Heart disease Mother 72    Died of MI at age 14 yo  . Heart disease Paternal Grandmother     requiring pacemaker.  [name] Heart disease Paternal Grandfather 3    Died of MI at possibly age 62-53yo  . Stroke Paternal Grandfather   . Heart disease Father 51    MI age 43yo requiring stenting  . Kidney disease Mother     requiring dialysis  . Congestive Heart Failure Mother   . Diabetes Father   . Diabetes Brother   . Heart disease Brother 46    MI at age 87 years old  . Breast cancer Paternal Aunt   . Breast cancer Maternal Grandmother     SH: History   Social History  . Marital Status: Single    Spouse Name: N/A    Number of Children: 2  . Years of Education: 9th grade   Occupational History  . Unemployed     planning on getting disability   Social History Main Topics  . Smoking status: Never Smoker   . Smokeless tobacco: Never Used  . Alcohol Use: No  . Drug Use: No  . Sexual Activity: Yes    Birth Control/ Protection: Surgical   Other Topics Concern  . None   Social History Narrative   Lives in [place] with her son. Is able to read and  write fluently in [place].    Review of Systems: Constitutional: Negative for fever, chills and weight loss.  Eyes: Negative for blurred  vision.  Respiratory: Negative for cough and shortness of breath.  Cardiovascular: Negative for chest pain, palpitations and leg swelling.  Gastrointestinal: Negative for nausea, vomiting, abdominal pain, diarrhea, constipation and blood in stool.  Genitourinary: Negative for dysuria, urgency and frequency.  Musculoskeletal: Negative for myalgias and back pain.  Neurological: +dizziness, weakness and headaches.     Objective:   Vital Signs: Filed Vitals:   07/24/14 1331  BP: 155/101  Pulse: 108  Temp: 98.2 F (36.8 C)  TempSrc: Oral  Height: 5\' 9"  (1.753 m)  Weight: 266 lb 4.8 oz (120.793 kg)  SpO2: 99%      BP Readings from Last 3 Encounters:  07/24/14 155/101  07/23/14 91/59  07/05/14 142/77    Physical Exam: Constitutional: Vital signs reviewed.  Patient is well-developed and well-nourished in NAD and cooperative with exam.  She limps with a cane.   Head: Normocephalic and atraumatic. Eyes: PERRL, EOMI, conjunctivae nl, no scleral icterus.  Neck: Supple. Cardiovascular: RRR, no MRG. Pulmonary/Chest: normal effort, non-tender to palpation, CTAB, no wheezes, rales, or rhonchi. Abdominal: Obese. Soft. NT/ND +BS. Neurological: A&O x3, cranial nerves II-XII are grossly intact, moving all extremities. Extremities: 2+DP b/l; no pitting edema. Skin: Warm, dry.  Two small 1cm x 0.5cm wounds on her buttocks bilaterally (one on the left and one on the right).    Most Recent Laboratory Results:  CMP     Component Value Date/Time   NA 140 07/05/2014 0339   K 3.5* 07/05/2014 0339   CL 99 07/05/2014 0339   CO2 27 07/05/2014 0339   GLUCOSE 123* 07/05/2014 0339   BUN 9 07/05/2014 0339   CREATININE 0.69 07/05/2014 0339   CREATININE 0.69 07/03/2014 1524   CALCIUM 9.1 07/05/2014 0339   PROT 7.5 07/03/2014 1524   ALBUMIN 4.4 07/03/2014 1524   AST  12 07/03/2014 1524   ALT 11 07/03/2014 1524   ALKPHOS 66 07/03/2014 1524   BILITOT 0.6 07/03/2014 1524   GFRNONAA >90 07/05/2014 0339   GFRNONAA >89 07/03/2014 1524   GFRAA >90 07/05/2014 0339   GFRAA >89 07/03/2014 1524    CBC    Component Value Date/Time   WBC 11.4* 07/05/2014 0339   RBC 4.20 07/05/2014 0339   HGB 11.5* 07/05/2014 0339   HCT 36.1 07/05/2014 0339   PLT 282 07/05/2014 0339   MCV 86.0 07/05/2014 0339   MCH 27.4 07/05/2014 0339   MCHC 31.9 07/05/2014 0339   RDW 13.3 07/05/2014 0339   LYMPHSABS 3.8 07/05/2014 1459   MONOABS 0.7 07/05/2014 1459   EOSABS 0.2 07/05/2014 1459   BASOSABS 0.0 07/05/2014 1459    Lipid Panel Lab Results  Component Value Date   CHOL 187 01/10/2014   HDL 43 01/10/2014   LDLCALC 105* 01/10/2014   LDLDIRECT 110.9 08/27/2011   TRIG 194* 01/10/2014   CHOLHDL 4.3 01/10/2014    HA1C Lab Results  Component Value Date   HGBA1C 8.3 07/03/2014    Urinalysis    Component Value Date/Time   COLORURINE YELLOW 07/04/2014 2246   APPEARANCEUR CLOUDY* 07/04/2014 2246   LABSPEC 1.012 07/04/2014 2246   PHURINE 5.5 07/04/2014 2246   GLUCOSEU 250* 07/04/2014 2246   HGBUR NEGATIVE 07/04/2014 2246   BILIRUBINUR NEGATIVE 07/04/2014 2246   BILIRUBINUR neg 12/19/2013 0952   KETONESUR NEGATIVE 07/04/2014 2246   PROTEINUR NEGATIVE 07/04/2014 2246   PROTEINUR neg 12/19/2013 0952   UROBILINOGEN 0.2 07/04/2014 2246   UROBILINOGEN 1.0 12/19/2013 0952   NITRITE NEGATIVE 07/04/2014 2246  NITRITE neg 12/19/2013 0952   LEUKOCYTESUR MODERATE* 07/04/2014 2246    Urine Microalbumin Lab Results  Component Value Date   MICROALBUR 0.85 09/13/2013    Imaging N/A   Assessment & Plan:   Assessment and plan was discussed and formulated with my attending.

## 2014-07-24 NOTE — Assessment & Plan Note (Signed)
Resolved, BP today 155/101, HR 108.  Likely in the setting of polypharmacy including too many BP meds, percocet, promethazine, etc.  Pt is in desperate need of medication management.  In systolic HF, pt should be on hydralazine-isosorbide combo which she is not (only on hydralazine).  -decrease clonidine to 0.2mg  daily -decrease hydralazine to 50mg  3 times daily -add isosorbide 20mg  3 times daily -continue amlodipine 5mg  daily -continue metoprolol 25mg   -goal is to taper down clonidine dose to stop -pt will check BP at home at least once daily, preferably twice daily -recheck in 1 week

## 2014-07-24 NOTE — Patient Instructions (Signed)
Thank you for your visit today.   Please return to the internal medicine clinic in 1 week to recheck your blood pressure.   Please take: 1. Clonidine 0.2mg  once daily 2. Hydralazine 1/2 tab (50mg ) three times daily 3. Amlodipine 5mg  daily 4. metoprolol 25mg  2x day 5. isosorbide 20mg  3x day   Please discontinue baclofen, phenergan, gabapentin (only when needed--can make drowsy), tegretol (decrease dosage to as needed).   Please check your blood pressure daily and keep a record of this to bring when you come back in 1 week.   Please be sure to bring all of your medications with you to every visit; this includes herbal supplements, vitamins, eye drops, and any over-the-counter medications.   Should you have any questions regarding your medications and/or any new or worsening symptoms, please be sure to call the clinic at [phone].   If you believe that you are suffering from a life threatening condition or one that may result in the loss of limb or function, then you should call 911 or proceed to the nearest Emergency Department.     A healthy lifestyle and preventative care can promote health and wellness.   Maintain regular health, dental, and eye exams.  Eat a healthy diet. Foods like vegetables, fruits, whole grains, low-fat dairy products, and lean protein foods contain the nutrients you need without too many calories. Decrease your intake of foods high in solid fats, added sugars, and salt. Get information about a proper diet from your caregiver, if necessary.  Regular physical exercise is one of the most important things you can do for your health. Most adults should get at least 150 minutes of moderate-intensity exercise (any activity that increases your heart rate and causes you to sweat) each week. In addition, most adults need muscle-strengthening exercises on 2 or more days a week.   Maintain a healthy weight. The body mass index (BMI) is a screening tool to identify  possible weight problems. It provides an estimate of body fat based on height and weight. Your caregiver can help determine your BMI, and can help you achieve or maintain a healthy weight. For adults 20 years and older:  A BMI below 18.5 is considered underweight.  A BMI of 18.5 to 24.9 is normal.  A BMI of 25 to 29.9 is considered overweight.  A BMI of 30 and above is considered obese.

## 2014-07-24 NOTE — Assessment & Plan Note (Addendum)
Pt reports generalized weakness going on for >1 month.  Denies any source of infection-no fever/chills, cough, dysuria, sores (2 small on buttocks which do not appear infected).  No known h/o anemia although she reports heavy menstrual periods at times, no hematuria, no hematochezia, no melena.  TSH checked 9 months ago was normal.  Vit D level not previously checked.  Other possible etiologies include: polypharmacy (most likely--pt goes to pain clinic and has been prescribed flexeril and baclofen and has been taking these together in addition to gabapentin, tegretol (for RLS).  Also has been taking phenergan for nausea.  Additionally, this could also be multifactorial with heart failure contributing as well as uncontrolled DM.     -stop baclofen -stop phenergan  -instructed pt to take flexeril only when needed (preferably at bedtime); taking 10mg  3 times daily  -instructed to take gabapentin only when needed; currently taking 300mg  3 times daily  -check Vit D -check tegretol level

## 2014-07-25 LAB — CARBAMAZEPINE LEVEL, TOTAL: Carbamazepine Lvl: 3.9 ug/mL — ABNORMAL LOW (ref 4.0–12.0)

## 2014-07-25 LAB — VITAMIN D 25 HYDROXY (VIT D DEFICIENCY, FRACTURES): VIT D 25 HYDROXY: 20 ng/mL — AB (ref 30–89)

## 2014-07-25 MED ORDER — VITAMIN D (ERGOCALCIFEROL) 1.25 MG (50000 UNIT) PO CAPS: 50000.0000 [IU] | ORAL_CAPSULE | ORAL | Status: DC

## 2014-07-25 NOTE — Addendum Note (Signed)
Addended by: Marrian Salvage on: 07/25/2014 02:27 PM   Modules accepted: Orders

## 2014-07-29 ENCOUNTER — Telehealth: Payer: Self-pay | Admitting: *Deleted

## 2014-07-29 ENCOUNTER — Emergency Department (HOSPITAL_COMMUNITY)
Admission: EM | Admit: 2014-07-29 | Discharge: 2014-07-29 | Discharge status: Against Medical Advice | Payer: Medicaid Other | Attending: Emergency Medicine | Admitting: Emergency Medicine

## 2014-07-29 ENCOUNTER — Encounter (HOSPITAL_COMMUNITY): Payer: Self-pay | Admitting: Emergency Medicine

## 2014-07-29 DIAGNOSIS — I5032 Chronic diastolic (congestive) heart failure: Secondary | ICD-10-CM | POA: Diagnosis not present

## 2014-07-29 DIAGNOSIS — G8929 Other chronic pain: Secondary | ICD-10-CM | POA: Insufficient documentation

## 2014-07-29 DIAGNOSIS — Z9889 Other specified postprocedural states: Secondary | ICD-10-CM | POA: Diagnosis not present

## 2014-07-29 DIAGNOSIS — E1142 Type 2 diabetes mellitus with diabetic polyneuropathy: Secondary | ICD-10-CM | POA: Diagnosis not present

## 2014-07-29 DIAGNOSIS — J45901 Unspecified asthma with (acute) exacerbation: Secondary | ICD-10-CM | POA: Diagnosis not present

## 2014-07-29 DIAGNOSIS — I2510 Atherosclerotic heart disease of native coronary artery without angina pectoris: Secondary | ICD-10-CM | POA: Diagnosis not present

## 2014-07-29 DIAGNOSIS — G4733 Obstructive sleep apnea (adult) (pediatric): Secondary | ICD-10-CM | POA: Insufficient documentation

## 2014-07-29 DIAGNOSIS — E1149 Type 2 diabetes mellitus with other diabetic neurological complication: Secondary | ICD-10-CM | POA: Diagnosis not present

## 2014-07-29 DIAGNOSIS — I10 Essential (primary) hypertension: Secondary | ICD-10-CM | POA: Diagnosis not present

## 2014-07-29 DIAGNOSIS — K5900 Constipation, unspecified: Secondary | ICD-10-CM | POA: Diagnosis not present

## 2014-07-29 NOTE — Telephone Encounter (Signed)
Pt presents asking for "something for constipation", could you please order something and send electronically

## 2014-07-29 NOTE — ED Notes (Signed)
Pt reports being constipated, last bm was last week. Reports when she gets up to walk she will pass small amount of stool. No relief with otc laxatives.

## 2014-07-29 NOTE — ED Notes (Signed)
Pt left AMA °

## 2014-07-29 NOTE — ED Notes (Signed)
Pt told registration that she was going to leave. Called pt, no answer. Assumed pt has left.

## 2014-07-29 NOTE — Telephone Encounter (Signed)
Returned pt phone call from triage line [phone]. Left message at 3:40PM and again 4:40PM that [facility] returned call to pt. Stanton Kidney Kristiana Jacko RN 07/29/14 4:40PM

## 2014-07-30 ENCOUNTER — Encounter: Payer: Medicaid Other | Attending: Physical Medicine & Rehabilitation | Admitting: Physical Medicine & Rehabilitation

## 2014-07-30 ENCOUNTER — Encounter: Payer: Self-pay | Admitting: Physical Medicine & Rehabilitation

## 2014-07-30 VITALS — BP 110/49 | HR 119 | Resp 16

## 2014-07-30 DIAGNOSIS — I5042 Chronic combined systolic (congestive) and diastolic (congestive) heart failure: Secondary | ICD-10-CM

## 2014-07-30 DIAGNOSIS — I6330 Cerebral infarction due to thrombosis of unspecified cerebral artery: Secondary | ICD-10-CM | POA: Diagnosis present

## 2014-07-30 DIAGNOSIS — G8110 Spastic hemiplegia affecting unspecified side: Secondary | ICD-10-CM | POA: Insufficient documentation

## 2014-07-30 DIAGNOSIS — I509 Heart failure, unspecified: Secondary | ICD-10-CM | POA: Insufficient documentation

## 2014-07-30 DIAGNOSIS — I63319 Cerebral infarction due to thrombosis of unspecified middle cerebral artery: Secondary | ICD-10-CM

## 2014-07-30 DIAGNOSIS — G8114 Spastic hemiplegia affecting left nondominant side: Secondary | ICD-10-CM

## 2014-07-30 DIAGNOSIS — E1149 Type 2 diabetes mellitus with other diabetic neurological complication: Secondary | ICD-10-CM | POA: Insufficient documentation

## 2014-07-30 DIAGNOSIS — E1142 Type 2 diabetes mellitus with diabetic polyneuropathy: Secondary | ICD-10-CM | POA: Diagnosis present

## 2014-07-30 MED ORDER — OXYCODONE-ACETAMINOPHEN 10-325 MG PO TABS: 1.0000 | ORAL_TABLET | Freq: Three times a day (TID) | ORAL | Status: DC | PRN

## 2014-07-30 NOTE — Progress Notes (Signed)
Subjective:    Patient ID: [name], female    DOB: Jan 20, 1966, 48 y.o.   MRN: [id number]  HPI  I had initially scheduled [place] for botox injections today, but her spasticity improved with the baclofen. We therefore, decided to hold off for now. She has been working diligently on her ROM at home. She increased the baclofen to 10mg  and this has helped her spasticity. She still will wake up with some tightness or note some tightness with gait in the LUE but overally it's better. She follows closely with cardiology for her chronic CHF as well.   She is unable to wear the left WHO due to an allergic reaction. She wears her AFO every day.   She does report some rigth hip pain which tends to be worse with walking and sometimes affects her sleep.    Pain Inventory Average Pain 7 Pain Right Now 5 My pain is constant and aching  In the last 24 hours, has pain interfered with the following? General activity 7 Relation with others 7 Enjoyment of life 7 What TIME of day is your pain at its worst? varies Sleep (in general) Poor  Pain is worse with: walking Pain improves with: medication Relief from Meds: 10  Mobility walk with assistance use a cane  Function disabled: date disabled . I need assistance with the following:  meal prep, household duties and shopping  Neuro/Psych numbness trouble walking spasms  Prior Studies Any changes since last visit?  no  Physicians involved in your care Any changes since last visit?  no   Family History  Problem Relation Age of Onset  . Heart disease Mother 76    Died of MI at age 60 yo  . Heart disease Paternal Grandmother     requiring pacemaker.  45 Heart disease Paternal Grandfather 38    Died of MI at possibly age 2-53yo  . Stroke Paternal Grandfather   . Heart disease Father 22    MI age 14yo requiring stenting  . Kidney disease Mother     requiring dialysis  . Congestive Heart Failure Mother   . Diabetes Father     . Diabetes Brother   . Heart disease Brother 44    MI at age 72 years old  . Breast cancer Paternal Aunt   . Breast cancer Maternal Grandmother    History   Social History  . Marital Status: Single    Spouse Name: N/A    Number of Children: 2  . Years of Education: 9th grade   Occupational History  . Unemployed     planning on getting disability   Social History Main Topics  . Smoking status: Never Smoker   . Smokeless tobacco: Never Used  . Alcohol Use: No  . Drug Use: No  . Sexual Activity: Yes    Birth Control/ Protection: Surgical   Other Topics Concern  . None   Social History Narrative   Lives in [place] with her son. Is able to read and write fluently in [place].   Past Surgical History  Procedure Laterality Date  . Tubal ligation  05/31/1985  . Carpal tunnel release Left   . Hemiarthroplasty shoulder fracture Right 1980's  . Multiple tooth extractions  ~ 2011    tumors removed ; "my whole top"  . Cardiac catheterization  05/2011  . Tee without cardioversion N/A 01/14/2014    Procedure: TRANSESOPHAGEAL ECHOCARDIOGRAM (TEE);  Surgeon: 03/14/2014, MD;  Location: [facility] ENDOSCOPY;  Service:  Cardiovascular;  Laterality: N/A;  . Breast surgery Bilateral 2011    patient reports benign results  . Bi-ventricular implantable cardioverter defibrillator  (crt-d)  08/2013    [name] 08/23/2013   Past Medical History  Diagnosis Date  . Hypertension     Poorly controlled. Has had HTN since age 75. Angioedema with ACEI.  24 Hr urine and renal arterial dopplers ordered . . . Never done  . NICM (nonischemic cardiomyopathy)     EF 45-50% in 8/12, cath 6/12 showed normal coronaries, EF 50-55% by LV gram  . Morbid obesity   . Allergic rhinitis   . HLD (hyperlipidemia)   . OSA on CPAP     sleep study in 8/12 showed moderate to severe OSA requiring CPAP  . Chronic lower back pain     secondary to DJD, obsetiy, hip problems. Followed by Dr. Ivory Broad (pain management)  .  Chronic diastolic heart failure      Primarily diastolic CHF: Likely due to uncontrolled HTN. Last echo (8/12) with EF 45-50%, mild to moderate LVH with some asymmetric septal hypertrophy, RV normal size and systolic function. EF 50-55% by LV-gram in 6/12.   . Polyneuropathy in diabetes(357.2)   . Thoracic or lumbosacral neuritis or radiculitis, unspecified   . Calcifying tendinitis of shoulder   . LBBB (left bundle branch block)   . Chronic combined systolic and diastolic CHF (congestive heart failure)     EF 40-45% by echo 12/06/2012  . Coronary artery disease     questionable. LHC 05/2011 showing normal coronaries // Followed at [facility], Dr. Shirlee Latch  . GERD (gastroesophageal reflux disease)   . Shortness of breath   . Asthma   . Automatic implantable cardioverter-defibrillator in situ   . Stroke 12/2013    "my left side is paralyzed" (07/04/2014)  . Type II diabetes mellitus DX: 2002  . 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 Degeneration of lumbar or lumbosacral intervertebral disc   . Arthritis     "hips, back, legs, arms" (07/04/2014)  . Frequent UTI    BP 110/49  Pulse 119  Resp 16  SpO2 98%  LMP 07/09/2014  Opioid Risk Score:   Fall Risk Score: High Fall Risk (>13 points) (previously educated and given handout)  Review of Systems  Musculoskeletal: Positive for gait problem.       Spasms  Neurological: Positive for numbness.  All other systems reviewed and are negative.      Objective:   Physical Exam Constitutional: She is oriented to person, place, and time. She appears well-developed and well-nourished.  Obese --no change  HENT:  Head: Normocephalic and atraumatic.  Nose: Nose normal.  Eyes: Conjunctivae and EOM are normal. Pupils are equal, round, and reactive to light.  Neck: Normal range of motion.  Cardiovascular: Normal rate and regular rhythm. Defib/pacer left upper chest wall, site clean  Pulmonary/Chest: Effort normal and  breath sounds normal.  Abdominal: She exhibits no distension. There is no tenderness.  Musculoskeletal: Patient had pain with palpation over the righ tlower lumbar paraspinals and upper pelvis. Hip was non-tender. . She ambulated and tended to hyperextend at the right hip and knee to assist with swing of the left hip.   She was unable to flex to 40 degrees and extend past 10 degrees. She struggled to stand after sitting. 1+ edema left lower ext.   1+ LUE. Equinovarus deformity left foot still present but improved. [name]  Mild joint line tenderness bilaterally. She has bilateral valgus deformities at each knee, right more than left.       Neurological: She is alert and oriented to person, place, and time. Left upper trace at bicep and deltoid. 0/5 elsewhere in UE and LLE. Tone 1/4 bicep trace to 0 at pec and wrist/fingers, 1 gastroc, TP, tib anterior trace. DTR's 3+ on left. Left central 7 and tongue deviation. Speech minimally dysarthric. Intelligence normal. sensory deficit (stocking glove both legs) is present.  Psychiatric: She has a normal mood and affect. Her behavior is normal. Judgment and thought content normal.  Assessment & Plan:    ASSESSMENT:  1. Chronic low back pain which is multifactorial. Recent MRI of the  back is notable for disc disease and facet hypertrophy. Most  involved level appeared to be L4-L5, where there is a shallow broad-  based disk herniation and facet arthropathy with mild stenosis.  2. Diabetic peripheral neuropathy.  3. Bilateral rotator cuff syndrome.  4. 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  5. Cardiomyopathy/HTN. It is quite obvious that she's feeling better given her weight loss and pacer.  6. 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  7. Morbid obesity    PLAN:  1. Continuie tegretol at 200mg  bid and gabapentin 300-300-600mg  for neuropathic pain.   -recent tegretol level checked although level  was low as expected. 2. Percocet 10/325 was refilled one q8 prn. #90 for 08/13/14.  3. Continue rehab efforts at home. Probably would benefit from pool based exercises.  4. Continue baclofen for spasticity control. Will hold on botox presently given her exam.  5. Cardiac mgt per Dr. 10/13/14   6. Continue Voltaren gel for knees/ hands, this would be quite helpful. .  7. Will send [facility] for repair of current AFO's and orthotic shoes with left kickplate. Might look into new left WHO as well.  8. I will see her back in about 2 months' time. 30 minutes of face to face patient care time were spent during this visit. All questions were encouraged and answered.

## 2014-07-30 NOTE — Patient Instructions (Addendum)
PLEASE CALL ME WITH ANY PROBLEMS OR QUESTIONS (#[phone]).     LOOK INTO SOME OVER THE COUNTER COMPRESSION STOCKING

## 2014-07-31 ENCOUNTER — Ambulatory Visit (INDEPENDENT_AMBULATORY_CARE_PROVIDER_SITE_OTHER): Payer: Medicaid Other | Admitting: Internal Medicine

## 2014-07-31 ENCOUNTER — Encounter: Payer: Self-pay | Admitting: Internal Medicine

## 2014-07-31 VITALS — BP 140/95 | HR 104 | Temp 97.7°F | Ht 69.0 in | Wt 265.0 lb

## 2014-07-31 DIAGNOSIS — I959 Hypotension, unspecified: Secondary | ICD-10-CM

## 2014-07-31 DIAGNOSIS — R5383 Other fatigue: Secondary | ICD-10-CM

## 2014-07-31 DIAGNOSIS — Z0000 Encounter for general adult medical examination without abnormal findings: Secondary | ICD-10-CM

## 2014-07-31 DIAGNOSIS — R531 Weakness: Secondary | ICD-10-CM

## 2014-07-31 DIAGNOSIS — R5381 Other malaise: Secondary | ICD-10-CM

## 2014-07-31 NOTE — Progress Notes (Signed)
Patient ID: [name], female   DOB: [birth year], 48 y.o.   MRN: [id number]    Subjective:   Patient ID: [name] female    DOB: 27-Feb-1966 48 y.o.    MRN: [id number] Health Maintenance Due: Health Maintenance Due  Topic Date Due  . Pneumococcal Polysaccharide Vaccine (##1) 08/28/1968  . Pap Smear  08/28/1984  . Tetanus/tdap  08/28/1985  . Influenza Vaccine  07/13/2014    _________________________________________________  HPI: CaitlynCaitlyn Jacobs is a 48 y.o. female here for an acute visit for a recheck of her BP.  Pt has a PMH outlined below.  Please see problem-based charting assessment and plan note for further details of medical issues addressed at today's visit.  PMH: Past Medical History  Diagnosis Date  . Hypertension     Poorly controlled. Has had HTN since age 89. Angioedema with ACEI.  24 Hr urine and renal arterial dopplers ordered . . . Never done  . NICM (nonischemic cardiomyopathy)     EF 45-50% in 8/12, cath 6/12 showed normal coronaries, EF 50-55% by LV gram  . Morbid obesity   . Allergic rhinitis   . HLD (hyperlipidemia)   . OSA on CPAP     sleep study in 8/12 showed moderate to severe OSA requiring CPAP  . Chronic lower back pain     secondary to DJD, obsetiy, hip problems. Followed by Dr. Ivory Broad (pain management)  . Chronic diastolic heart failure      Primarily diastolic CHF: Likely due to uncontrolled HTN. Last echo (8/12) with EF 45-50%, mild to moderate LVH with some asymmetric septal hypertrophy, RV normal size and systolic function. EF 50-55% by LV-gram in 6/12.   . Polyneuropathy in diabetes(357.2)   . Thoracic or lumbosacral neuritis or radiculitis, unspecified   . Calcifying tendinitis of shoulder   . LBBB (left bundle branch block)   . Chronic combined systolic and diastolic CHF (congestive heart failure)     EF 40-45% by echo 12/06/2012  . Coronary artery disease     questionable. LHC 05/2011 showing normal coronaries //  Followed at [facility], Dr. Shirlee Latch  . GERD (gastroesophageal reflux disease)   . Shortness of breath   . Asthma   . Automatic implantable cardioverter-defibrillator in situ   . Stroke 12/2013    "my left side is paralyzed" (07/04/2014)  . Type II diabetes mellitus DX: 2002  . 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 Degeneration of lumbar or lumbosacral intervertebral disc   . Arthritis     "hips, back, legs, arms" (07/04/2014)  . Frequent UTI     Medications: Current Outpatient Prescriptions on File Prior to Visit  Medication Sig Dispense Refill  . amLODipine (NORVASC) 5 MG tablet Take 1 tablet (5 mg total) by mouth daily.  30 tablet  3  . aspirin 325 MG tablet Take 1 tablet (325 mg total) by mouth daily.  30 tablet  5  . cyclobenzaprine (FLEXERIL) 10 MG tablet Take 10 mg by mouth 3 (three) times daily as needed for muscle spasms.      . diclofenac sodium (VOLTAREN) 1 % GEL Apply 2 g topically 4 (four) times daily as needed (pain).  3 Tube  5  . digoxin (LANOXIN) 0.125 MG tablet Take 0.125 mg by mouth daily.      . fluticasone (CUTIVATE) 0.05 % cream Apply topically 2 (two) times daily. Dispense two tubes  30 g  6  .  furosemide (LASIX) 40 MG tablet Take 40 mg by mouth daily.       . hydrALAZINE (APRESOLINE) 100 MG tablet Take 100 mg by mouth 3 (three) times daily.      [name] ibuprofen (ADVIL,MOTRIN) 200 MG tablet Take 400 mg by mouth 3 (three) times daily as needed for mild pain.      [name] insulin glargine (LANTUS) 100 UNIT/ML injection Inject 10-35 Units into the skin 2 (two) times daily. 10 units in am, 35 units in pm      . isosorbide dinitrate (ISORDIL) 20 MG tablet Take 1 tablet (20 mg total) by mouth 3 (three) times daily.  90 tablet  0  . metFORMIN (GLUCOPHAGE) 1000 MG tablet Take 1 tablet (1,000 mg total) by mouth daily with breakfast.  30 tablet  11  . metoprolol succinate (TOPROL-XL) 25 MG 24 hr tablet Take 50 mg (2 tablets) in the morning and 25 mg (1  tablet) in the evening.  90 tablet  3  . oxyCODONE-acetaminophen (PERCOCET) 10-325 MG per tablet Take 1 tablet by mouth every 8 (eight) hours as needed for pain.  90 tablet  0  . pantoprazole (PROTONIX) 40 MG tablet Take 40 mg by mouth daily.      . simvastatin (ZOCOR) 40 MG tablet Take 40 mg by mouth at bedtime.       . Vitamin D, Ergocalciferol, (DRISDOL) 50000 UNITS CAPS capsule Take 1 capsule (50,000 Units total) by mouth every 7 (seven) days.  6 capsule  0   No current facility-administered medications on file prior to visit.    Allergies: Allergies  Allergen Reactions  . Clindamycin Anaphylaxis and Swelling  . Ace Inhibitors Swelling  . Enalapril Maleate Swelling  . Lisinopril Swelling    FH: Family History  Problem Relation Age of Onset  . Heart disease Mother 35    Died of MI at age 48 yo  . Heart disease Paternal Grandmother     requiring pacemaker.  [name] Heart disease Paternal Grandfather 30    Died of MI at possibly age 20-53yo  . Stroke Paternal Grandfather   . Heart disease Father 68    MI age 88yo requiring stenting  . Kidney disease Mother     requiring dialysis  . Congestive Heart Failure Mother   . Diabetes Father   . Diabetes Brother   . Heart disease Brother 90    MI at age 49 years old  . Breast cancer Paternal Aunt   . Breast cancer Maternal Grandmother     SH: History   Social History  . Marital Status: Single    Spouse Name: N/A    Number of Children: 2  . Years of Education: 9th grade   Occupational History  . Unemployed     planning on getting disability   Social History Main Topics  . Smoking status: Never Smoker   . Smokeless tobacco: Never Used  . Alcohol Use: No  . Drug Use: No  . Sexual Activity: Yes    Birth Control/ Protection: Surgical   Other Topics Concern  . None   Social History Narrative   Lives in [place] with her son. Is able to read and write fluently in [place].    Review of Systems: Constitutional:  Negative for fever, chills and weight loss.  Eyes: Negative for blurred vision.  Respiratory: Negative for cough and shortness of breath.  Cardiovascular: Negative for chest pain, palpitations and leg swelling.  Gastrointestinal: Negative for nausea, vomiting, abdominal  pain, diarrhea, constipation and blood in stool.  Genitourinary: Negative for dysuria, urgency and frequency.  Musculoskeletal: Negative for myalgias and back pain.  Neurological: Negative for dizziness, weakness and headaches.     Objective:   Vital Signs: Filed Vitals:   07/31/14 1104  BP: 140/95  Pulse: 104  Temp: 97.7 F (36.5 C)  TempSrc: Oral  Height: 5\' 9"  (1.753 m)  Weight: 265 lb (120.203 kg)  SpO2: 100%      BP Readings from Last 3 Encounters:  07/31/14 140/95  07/30/14 110/49  07/29/14 158/100    Physical Exam: Constitutional: Vital signs reviewed.  Patient is well-developed and well-nourished in NAD and cooperative with exam.  Head: Normocephalic and atraumatic. Eyes: PERRL, EOMI, conjunctivae nl, no scleral icterus.  Neck: Supple. Cardiovascular: RRR, no MRG. Pulmonary/Chest: normal effort, non-tender to palpation, CTAB, no wheezes, rales, or rhonchi. Abdominal: Soft. NT/ND +BS. Extremities: 2+DP b/l; no pitting edema. Skin: Warm, dry and intact. No rash.  Most Recent Laboratory Results:  CMP     Component Value Date/Time   NA 140 07/05/2014 0339   K 3.5* 07/05/2014 0339   CL 99 07/05/2014 0339   CO2 27 07/05/2014 0339   GLUCOSE 123* 07/05/2014 0339   BUN 9 07/05/2014 0339   CREATININE 0.69 07/05/2014 0339   CREATININE 0.69 07/03/2014 1524   CALCIUM 9.1 07/05/2014 0339   PROT 7.5 07/03/2014 1524   ALBUMIN 4.4 07/03/2014 1524   AST 12 07/03/2014 1524   ALT 11 07/03/2014 1524   ALKPHOS 66 07/03/2014 1524   BILITOT 0.6 07/03/2014 1524   GFRNONAA >90 07/05/2014 0339   GFRNONAA >89 07/03/2014 1524   GFRAA >90 07/05/2014 0339   GFRAA >89 07/03/2014 1524    CBC    Component Value Date/Time    WBC 11.4* 07/05/2014 0339   RBC 4.20 07/05/2014 0339   HGB 11.5* 07/05/2014 0339   HCT 36.1 07/05/2014 0339   PLT 282 07/05/2014 0339   MCV 86.0 07/05/2014 0339   MCH 27.4 07/05/2014 0339   MCHC 31.9 07/05/2014 0339   RDW 13.3 07/05/2014 0339   LYMPHSABS 3.8 07/05/2014 1459   MONOABS 0.7 07/05/2014 1459   EOSABS 0.2 07/05/2014 1459   BASOSABS 0.0 07/05/2014 1459    Lipid Panel Lab Results  Component Value Date   CHOL 187 01/10/2014   HDL 43 01/10/2014   LDLCALC 105* 01/10/2014   LDLDIRECT 110.9 08/27/2011   TRIG 194* 01/10/2014   CHOLHDL 4.3 01/10/2014    HA1C Lab Results  Component Value Date   HGBA1C 8.3 07/03/2014    Urinalysis    Component Value Date/Time   COLORURINE YELLOW 07/04/2014 2246   APPEARANCEUR CLOUDY* 07/04/2014 2246   LABSPEC 1.012 07/04/2014 2246   PHURINE 5.5 07/04/2014 2246   GLUCOSEU 250* 07/04/2014 2246   HGBUR NEGATIVE 07/04/2014 2246   BILIRUBINUR NEGATIVE 07/04/2014 2246   BILIRUBINUR neg 12/19/2013 0952   KETONESUR NEGATIVE 07/04/2014 2246   PROTEINUR NEGATIVE 07/04/2014 2246   PROTEINUR neg 12/19/2013 0952   UROBILINOGEN 0.2 07/04/2014 2246   UROBILINOGEN 1.0 12/19/2013 0952   NITRITE NEGATIVE 07/04/2014 2246   NITRITE neg 12/19/2013 0952   LEUKOCYTESUR MODERATE* 07/04/2014 2246    Urine Microalbumin Lab Results  Component Value Date   MICROALBUR 0.85 09/13/2013    Imaging N/A   Assessment & Plan:   Assessment and plan was discussed and formulated with my attending.

## 2014-07-31 NOTE — Patient Instructions (Signed)
Thank you for your visit today.   Please return to the internal medicine clinic in 1 month(s) or sooner if needed.   Please take your blood pressure daily and make a record of this.    Your current medical regimen is effective;  continue present plan and take all medications as prescribed.    You need the following test(s) for regular health maintenance: mammogram  Please be sure to bring all of your medications with you to every visit; this includes herbal supplements, vitamins, eye drops, and any over-the-counter medications.   Should you have any questions regarding your medications and/or any new or worsening symptoms, please be sure to call the clinic at [phone].   If you believe that you are suffering from a life threatening condition or one that may result in the loss of limb or function, then you should call 911 or proceed to the nearest Emergency Department.     A healthy lifestyle and preventative care can promote health and wellness.   Maintain regular health, dental, and eye exams.  Eat a healthy diet. Foods like vegetables, fruits, whole grains, low-fat dairy products, and lean protein foods contain the nutrients you need without too many calories. Decrease your intake of foods high in solid fats, added sugars, and salt. Get information about a proper diet from your caregiver, if necessary.  Regular physical exercise is one of the most important things you can do for your health. Most adults should get at least 150 minutes of moderate-intensity exercise (any activity that increases your heart rate and causes you to sweat) each week. In addition, most adults need muscle-strengthening exercises on 2 or more days a week.   Maintain a healthy weight. The body mass index (BMI) is a screening tool to identify possible weight problems. It provides an estimate of body fat based on height and weight. Your caregiver can help determine your BMI, and can help you achieve or  maintain a healthy weight. For adults 20 years and older:  A BMI below 18.5 is considered underweight.  A BMI of 18.5 to 24.9 is normal.  A BMI of 25 to 29.9 is considered overweight.  A BMI of 30 and above is considered obese.

## 2014-08-01 NOTE — Assessment & Plan Note (Signed)
Resolved.  Pt is not taking clonidine now.  BP is almost at goal 140/95 today.  Considered increasing metoprolol as pt is mildly tachycardic but pt sees Dr. Milas Kocher next week. -continue current meds

## 2014-08-01 NOTE — Assessment & Plan Note (Signed)
Pt discontinued taking tegretol, baclofen, or gabapentin and reports feeling much stronger.  She is not as sleepy and weak as last OV. -continue current meds -follow up with PCP

## 2014-08-01 NOTE — Assessment & Plan Note (Signed)
-  mammogram ordered (FH of breast CA also)

## 2014-08-02 LAB — GLUCOSE, CAPILLARY: Glucose-Capillary: 145 mg/dL — ABNORMAL HIGH (ref 70–99)

## 2014-08-02 NOTE — Progress Notes (Signed)
Case discussed with Dr. Gill soon after the resident saw the patient.  We reviewed the resident's history and exam and pertinent patient test results.  I agree with the assessment, diagnosis and plan of care documented in the resident's note. 

## 2014-08-07 ENCOUNTER — Ambulatory Visit (HOSPITAL_COMMUNITY): Payer: Medicaid Other

## 2014-08-07 ENCOUNTER — Encounter (HOSPITAL_COMMUNITY): Payer: Self-pay

## 2014-08-07 ENCOUNTER — Ambulatory Visit (HOSPITAL_COMMUNITY)
Admission: RE | Admit: 2014-08-07 | Discharge: 2014-08-07 | Disposition: A | Discharge status: Home / Self Care | Payer: Medicaid Other | Source: Ambulatory Visit | Attending: Internal Medicine | Admitting: Internal Medicine

## 2014-08-07 VITALS — BP 152/92 | HR 98 | Resp 18 | Wt 267.1 lb

## 2014-08-07 DIAGNOSIS — I2510 Atherosclerotic heart disease of native coronary artery without angina pectoris: Secondary | ICD-10-CM | POA: Diagnosis not present

## 2014-08-07 DIAGNOSIS — Z8673 Personal history of transient ischemic attack (TIA), and cerebral infarction without residual deficits: Secondary | ICD-10-CM | POA: Diagnosis not present

## 2014-08-07 DIAGNOSIS — G4733 Obstructive sleep apnea (adult) (pediatric): Secondary | ICD-10-CM | POA: Diagnosis not present

## 2014-08-07 DIAGNOSIS — E785 Hyperlipidemia, unspecified: Secondary | ICD-10-CM | POA: Diagnosis not present

## 2014-08-07 DIAGNOSIS — I509 Heart failure, unspecified: Secondary | ICD-10-CM | POA: Diagnosis not present

## 2014-08-07 DIAGNOSIS — I5042 Chronic combined systolic (congestive) and diastolic (congestive) heart failure: Secondary | ICD-10-CM

## 2014-08-07 DIAGNOSIS — I428 Other cardiomyopathies: Secondary | ICD-10-CM

## 2014-08-07 DIAGNOSIS — I5022 Chronic systolic (congestive) heart failure: Secondary | ICD-10-CM | POA: Insufficient documentation

## 2014-08-07 DIAGNOSIS — I10 Essential (primary) hypertension: Secondary | ICD-10-CM

## 2014-08-07 DIAGNOSIS — E119 Type 2 diabetes mellitus without complications: Secondary | ICD-10-CM | POA: Insufficient documentation

## 2014-08-07 DIAGNOSIS — Z7982 Long term (current) use of aspirin: Secondary | ICD-10-CM | POA: Insufficient documentation

## 2014-08-07 LAB — BASIC METABOLIC PANEL
Anion gap: 15 (ref 5–15)
BUN: 6 mg/dL (ref 6–23)
CALCIUM: 9.4 mg/dL (ref 8.4–10.5)
CO2: 23 mEq/L (ref 19–32)
Chloride: 103 mEq/L (ref 96–112)
Creatinine, Ser: 0.85 mg/dL (ref 0.50–1.10)
GFR calc Af Amer: >90 mL/min (ref >90)
GFR, EST NON AFRICAN AMERICAN: 80 mL/min — AB (ref >90)
Glucose, Bld: 148 mg/dL — ABNORMAL HIGH (ref 70–99)
Potassium: 3.7 mEq/L (ref 3.7–5.3)
SODIUM: 141 meq/L (ref 137–147)

## 2014-08-07 MED ORDER — POTASSIUM CHLORIDE ER 20 MEQ PO TBCR: 20.0000 meq | EXTENDED_RELEASE_TABLET | Freq: Every day | ORAL | Status: DC

## 2014-08-07 MED ORDER — HYDRALAZINE HCL 100 MG PO TABS: 100.0000 mg | ORAL_TABLET | Freq: Three times a day (TID) | ORAL | Status: DC

## 2014-08-07 NOTE — Patient Instructions (Signed)
Take an extra lasix today and tomorrow and then go back to 40 mg (1 tablet) daily.  Start potassium 20 meq (1 tablet) daily.  Increase your hydralazine to 100 mg (1 tablet) three times a day.  Bring all of your medications to all of your visits.  Follow up in 2 months  Do the following things EVERYDAY: 1) Weigh yourself in the morning before breakfast. Write it down and keep it in a log. 2) Take your medicines as prescribed 3) Eat low salt foods-Limit salt (sodium) to 2000 mg per day.  4) Stay as active as you can everyday 5) Limit all fluids for the day to less than 2 liters

## 2014-08-07 NOTE — Progress Notes (Addendum)
Patient ID: [name], female   DOB: [birth year], 48 y.o.   MRN: [id number]   Weight Range  282 pounds  Baseline proBNP   EP: Dr Graciela Husbands PCP: Dr Yetta Barre or Dr. Lovenia Kim internal medicine  Angioedema with ACE-I  --Optivol--  HPI: 48 yo with history of morbid obesity, HTN, HLD, CAD, DM2, OSA on CPAP, CVA and combined CHF in the setting of poorly-controlled HTN. She was admitted in 05/2011 with chest pain and new LBBB and ended up getting a left heart cath, which showed no angiographic CAD. Renal artery dopplers were normal in 04/2012.   Was hospitalized May 2014 with acute on chronic combined systolic and diastolic CHF. EF from December 2013 was down to 40 to 45%.   Admitted in January 2015 with slurred speech, L arm weakness and facial droop. Initial head CT showed no acute abnormality. Repeat head CT showed subtle gray white matter differentiation in R frontal lobe and R operculum perhaps representing ischemic R sided MCA. STAT repeat CT head later on 1/29 showed further evolution of R MCA territory infarct involving R insular cortex and operculum. No hemorrhage. Neurology recommended CTA head/neck, which showed occlusion of MCA branch to the area of R MCA infarct and a normal neck CTA. TEE was done and did not reveal embolic source. Was discharged to inpatient rehab.   Follow up for EH:[facility]NOB last visit admitted to the hospital for UTI and diarrhea. Meds changed by PCP and decreased hydralazine to 50 mg TID, decreased clonidine to 0.2 mg daily and started isosorbide 20 mg TID. Overall feeling ok. Not as sleepy as usually. Denies SOB, orthopnea, PND, or CP. No falls. Taking medications as prescribed. SBP at home 160/80s.  Has personal care aide daily. No longer in OP PT. Walking throughout house.   ECHO 11/2012: 40-45% ECHO  07/2013: 20% RV normal ECHO    08/23/2013: EF 40-45%, diff HK ECHO   09/05/2013: EF 25-30%, diff HK  ECHO  12/11/13: EF 30%, diff HK, RV normal ECHO 01/14/2014: EF 35-40%, LA  mod dilated   Labs 06/2013: proBNP 230, Cr 0.9, K 3.7          08/2013: K+ 3.9, Cr 0.9       09/2013: K 4.2, Cr 0.86, pro-BNP 718, dig level <0.3     10/07/13: K+ 4.1, Cr 0.83       05/08/14: K 4.0 Creatinine 0.67 Pro BNP 233 Dig level 0.3   ROS: All systems negative except as listed in HPI, PMH and Problem List.  Past Medical History  Diagnosis Date  . Hypertension     Poorly controlled. Has had HTN since age 27. Angioedema with ACEI.  24 Hr urine and renal arterial dopplers ordered . . . Never done  . NICM (nonischemic cardiomyopathy)     EF 45-50% in 8/12, cath 6/12 showed normal coronaries, EF 50-55% by LV gram  . Morbid obesity   . Allergic rhinitis   . HLD (hyperlipidemia)   . OSA on CPAP     sleep study in 8/12 showed moderate to severe OSA requiring CPAP  . Chronic lower back pain     secondary to DJD, obsetiy, hip problems. Followed by Dr. Ivory Broad (pain management)  . Chronic diastolic heart failure      Primarily diastolic CHF: Likely due to uncontrolled HTN. Last echo (8/12) with EF 45-50%, mild to moderate LVH with some asymmetric septal hypertrophy, RV normal size and systolic function. EF 50-55% by LV-gram in  6/12.   . Polyneuropathy in diabetes(357.2)   . Thoracic or lumbosacral neuritis or radiculitis, unspecified   . Calcifying tendinitis of shoulder   . LBBB (left bundle branch block)   . Chronic combined systolic and diastolic CHF (congestive heart failure)     EF 40-45% by echo 12/06/2012  . Coronary artery disease     questionable. LHC 05/2011 showing normal coronaries // Followed at [facility], Dr. Shirlee Latch  . GERD (gastroesophageal reflux disease)   . Shortness of breath   . Asthma   . Automatic implantable cardioverter-defibrillator in situ   . Stroke 12/2013    "my left side is paralyzed" (07/04/2014)  . Type II diabetes mellitus DX: 2002  . 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 Degeneration of lumbar or lumbosacral  intervertebral disc   . Arthritis     "hips, back, legs, arms" (07/04/2014)  . Frequent UTI     Current Outpatient Prescriptions  Medication Sig Dispense Refill  . amLODipine (NORVASC) 5 MG tablet Take 1 tablet (5 mg total) by mouth daily.  30 tablet  3  . aspirin 325 MG tablet Take 1 tablet (325 mg total) by mouth daily.  30 tablet  5  . cyclobenzaprine (FLEXERIL) 10 MG tablet Take 10 mg by mouth 3 (three) times daily as needed for muscle spasms.      . diclofenac sodium (VOLTAREN) 1 % GEL Apply 2 g topically 4 (four) times daily as needed (pain).  3 Tube  5  . digoxin (LANOXIN) 0.125 MG tablet Take 0.125 mg by mouth daily.      . fluticasone (CUTIVATE) 0.05 % cream Apply topically 2 (two) times daily. Dispense two tubes  30 g  6  . furosemide (LASIX) 40 MG tablet Take 40 mg by mouth daily.       . hydrALAZINE (APRESOLINE) 100 MG tablet Take 100 mg by mouth 3 (three) times daily.      [name] ibuprofen (ADVIL,MOTRIN) 200 MG tablet Take 400 mg by mouth 3 (three) times daily as needed for mild pain.      [name] insulin glargine (LANTUS) 100 UNIT/ML injection Inject 10-35 Units into the skin 2 (two) times daily. 10 units in am, 35 units in pm      . isosorbide dinitrate (ISORDIL) 20 MG tablet Take 1 tablet (20 mg total) by mouth 3 (three) times daily.  90 tablet  0  . metFORMIN (GLUCOPHAGE) 1000 MG tablet Take 1 tablet (1,000 mg total) by mouth daily with breakfast.  30 tablet  11  . metoprolol succinate (TOPROL-XL) 25 MG 24 hr tablet Take 50 mg (2 tablets) in the morning and 25 mg (1 tablet) in the evening.  90 tablet  3  . oxyCODONE-acetaminophen (PERCOCET) 10-325 MG per tablet Take 1 tablet by mouth every 8 (eight) hours as needed for pain.  90 tablet  0  . pantoprazole (PROTONIX) 40 MG tablet Take 40 mg by mouth daily.      . simvastatin (ZOCOR) 40 MG tablet Take 40 mg by mouth at bedtime.       . Vitamin D, Ergocalciferol, (DRISDOL) 50000 UNITS CAPS capsule Take 1 capsule (50,000 Units total) by mouth  every 7 (seven) days.  6 capsule  0   No current facility-administered medications for this encounter.   Filed Vitals:   08/07/14 1450  BP: 152/92  Pulse: 98  Resp: 18  Weight: 267 lb 2 oz (121.167 kg)  SpO2:  99%   PHYSICAL EXAM: General:  NAD. No resp difficulty; son and grand-daughters present.  HEENT: normal Neck: supple. Thick. Hard to see JVP, but appears mildly elevated. Carotids 2+ bilaterally; no bruits. No lymphadenopathy appreciated.  Cor: PMI nonpalpable; Regular rate & rhythm. No rubs, gallops or murmurs. Split s2 Lungs: clear Abdomen: obese soft, nontender, mildly distended. No hepatosplenomegaly. No bruits or masses. Good bowel sounds. Extremities: no cyanosis, clubbing, rash, trace edema LLE brace RUE 1 + edema Neuro: alert & orientedx3, cranial nerves grossly intact. Moves all 4 extremities w/o difficulty. Affect pleasant.   ASSESSMENT:  1. Chronic systolic HF, NICM likely r/t HTN, EF 35-40% (01/2014) s/p CRT-D-[employer] - NYHA II symptoms and volume status slightly elevated will continue lasix 40 mg daily and will have her take an extra 40 mg lasix tomorrow.  - Optivol Interrogated: Optivol trending up and thoracic impedence trending down. Less than 1 hr of activity a day. No Afib.  - Last K+ low. Will start potassium 20 meq daily.  - Will continue Toprol 50 mg q am and 25 mg q pm. - SBP still elevated will stop amlodipine and increase hydralazine to 100 mg TID. Continue Isordil 20 mg TID. Continue digoxin, last level (04/2014 was <0.3) - No  ACE-I due to angioedmea.   - Reinforced the need and importance of daily weights, a low sodium diet, and fluid restriction (less than 2 L a day). Instructed to call the HF clinic if weight increases more than 3 lbs overnight or 5 lbs in a week.  2. Uncontrolled HTN - Remains elevated. As above stop amlodipine and increase hydralazine to 100 mg TID.  3. Morbid obesity - Encouraged weight loss with portion control. Asked patient  to try and be more active.  4. OSA on CPAP -Uses intermittently. Encouraged to use nightly.   F/U 2 months Ulla Potash B NP-C 2:53 PM   Addendum: Pharmacy called to clarify is it ok for patient to take potassium and spironolactone. Patient did not tell us she was taking spironolactone. Will need to recheck labs Sept 8th if she is taking both.

## 2014-08-08 MED ORDER — SPIRONOLACTONE 25 MG PO TABS: 25.0000 mg | ORAL_TABLET | Freq: Every day | ORAL | Status: DC

## 2014-08-08 NOTE — Addendum Note (Signed)
Encounter addended by: Aundria Rud, NP on: 08/08/2014  4:45 PM<BR>     Documentation filed: Notes Section

## 2014-08-08 NOTE — Addendum Note (Signed)
Encounter addended by: Theresia Bough, CMA on: 08/08/2014  4:50 PM<BR>     Documentation filed: Orders

## 2014-08-09 ENCOUNTER — Telehealth: Payer: Self-pay | Admitting: Licensed Clinical Social Worker

## 2014-08-09 NOTE — Telephone Encounter (Signed)
[name] has applied for CAP services.  Pt has Medicaid, CSW placed call to Ms. Pellum to confirm she has maxed out her PCS hours.  Pt states she receives 2.5 hours/7 days a week, which is approx 75 hours a month.  Pt aware she has an appointment with PCP on 08/27/14.  Application forwarded to PCP.

## 2014-08-20 ENCOUNTER — Ambulatory Visit (INDEPENDENT_AMBULATORY_CARE_PROVIDER_SITE_OTHER): Payer: Medicaid Other | Admitting: Internal Medicine

## 2014-08-20 ENCOUNTER — Encounter: Payer: Self-pay | Admitting: Internal Medicine

## 2014-08-20 ENCOUNTER — Telehealth (HOSPITAL_COMMUNITY): Payer: Self-pay | Admitting: Vascular Surgery

## 2014-08-20 ENCOUNTER — Ambulatory Visit (HOSPITAL_COMMUNITY)
Admission: RE | Admit: 2014-08-20 | Discharge: 2014-08-20 | Disposition: A | Discharge status: Home / Self Care | Payer: Medicaid Other | Source: Ambulatory Visit | Attending: Cardiology | Admitting: Cardiology

## 2014-08-20 VITALS — BP 148/98 | HR 64 | Temp 98.2°F | Ht 69.0 in | Wt 268.8 lb

## 2014-08-20 DIAGNOSIS — E1142 Type 2 diabetes mellitus with diabetic polyneuropathy: Secondary | ICD-10-CM

## 2014-08-20 DIAGNOSIS — IMO0002 Reserved for concepts with insufficient information to code with codable children: Secondary | ICD-10-CM

## 2014-08-20 DIAGNOSIS — E1149 Type 2 diabetes mellitus with other diabetic neurological complication: Secondary | ICD-10-CM

## 2014-08-20 DIAGNOSIS — I509 Heart failure, unspecified: Secondary | ICD-10-CM | POA: Insufficient documentation

## 2014-08-20 DIAGNOSIS — E1165 Type 2 diabetes mellitus with hyperglycemia: Secondary | ICD-10-CM

## 2014-08-20 DIAGNOSIS — N3946 Mixed incontinence: Secondary | ICD-10-CM

## 2014-08-20 DIAGNOSIS — E1140 Type 2 diabetes mellitus with diabetic neuropathy, unspecified: Secondary | ICD-10-CM

## 2014-08-20 DIAGNOSIS — I5022 Chronic systolic (congestive) heart failure: Secondary | ICD-10-CM | POA: Diagnosis present

## 2014-08-20 DIAGNOSIS — I10 Essential (primary) hypertension: Secondary | ICD-10-CM

## 2014-08-20 DIAGNOSIS — L259 Unspecified contact dermatitis, unspecified cause: Secondary | ICD-10-CM

## 2014-08-20 DIAGNOSIS — G8110 Spastic hemiplegia affecting unspecified side: Secondary | ICD-10-CM

## 2014-08-20 DIAGNOSIS — I6350 Cerebral infarction due to unspecified occlusion or stenosis of unspecified cerebral artery: Secondary | ICD-10-CM

## 2014-08-20 DIAGNOSIS — I5042 Chronic combined systolic (congestive) and diastolic (congestive) heart failure: Secondary | ICD-10-CM

## 2014-08-20 LAB — BASIC METABOLIC PANEL
Anion gap: 14 (ref 5–15)
BUN: 6 mg/dL (ref 6–23)
CALCIUM: 8.7 mg/dL (ref 8.4–10.5)
CO2: 22 mEq/L (ref 19–32)
CREATININE: 0.56 mg/dL (ref 0.50–1.10)
Chloride: 100 mEq/L (ref 96–112)
GFR calc Af Amer: >90 mL/min (ref >90)
Glucose, Bld: 154 mg/dL — ABNORMAL HIGH (ref 70–99)
Potassium: 3.8 mEq/L (ref 3.7–5.3)
SODIUM: 136 meq/L — AB (ref 137–147)

## 2014-08-20 MED ORDER — TAMSULOSIN HCL 0.4 MG PO CAPS: 0.4000 mg | ORAL_CAPSULE | Freq: Every day | ORAL | Status: DC

## 2014-08-20 NOTE — Patient Instructions (Signed)
1. Please schedule a follow up appointment for next week.   2. Please take all medications as prescribed.   Start taking Flomax 0.4 mg once daily  A referral has been placed for you for the urologist to further discuss your urinary issues.  3. If you have worsening of your symptoms or new symptoms arise, please call the clinic ([phone]), or go to the ER immediately if symptoms are severe.  You have done a great job in taking all your medications. I appreciate it very much. Please continue doing that.

## 2014-08-20 NOTE — Telephone Encounter (Signed)
[name] called  About pt tele monitoring.. Please advise

## 2014-08-20 NOTE — Progress Notes (Signed)
Subjective:   Patient ID: [name] female   DOB: February 16, 1966 48 y.o.   MRN: [id number]  HPI: [name] is a 48 y.o. female w/ PMHx of HTN, HLD, uncontrolled DM type II, OSA, CAD, CHF, and h/o CVA w/ left-sided residual weakness, presents to the clinic today for complaints of urinary retention. She claims this has been going on since her stroke in January, but says it has been significantly worse over the pat 3 weeks or so. She claims that she feels the urge to go to the bathroom, but when she sits down to go, she cannot urinate. She then frequently has urge/overflow incontinence that she says is very inconvenient and embarrassing. She has been wearing adult briefs which helps, but she says it is still uncomfortable. She denies any dysuria, hematuria, increased frequency, flank pain, fever, chills, nausea, or vomiting.  Ms. Lumadue also states she has been having difficulty controlling her blood pressure recently as well. On 07/23/14, patient rpesented to clinic w/ significant hypotension, resulting in the discontinuation of her Clonidine and decreased dose of her Hydralazine. NOW, the patient is taking Hydralazine 100 mg tid, Toprol-XL 50 mg in AM , 25 mg in PM, Isordil 60 mg ER, and Lasix prn. She says her BP has been slightly elevated at home as well, but attributes this to home and family stressors as she says 2 of her nephews recently passed away. She denies any associated chest pain, SOB, dizziness, lightheadedness, or palpitations.   Past Medical History  Diagnosis Date  . Hypertension     Poorly controlled. Has had HTN since age 10. Angioedema with ACEI.  24 Hr urine and renal arterial dopplers ordered . . . Never done  . NICM (nonischemic cardiomyopathy)     EF 45-50% in 8/12, cath 6/12 showed normal coronaries, EF 50-55% by LV gram  . Morbid obesity   . Allergic rhinitis   . HLD (hyperlipidemia)   . OSA on CPAP     sleep study in 8/12 showed moderate to severe OSA  requiring CPAP  . Chronic lower back pain     secondary to DJD, obsetiy, hip problems. Followed by Dr. Ivory Broad (pain management)  . Chronic diastolic heart failure      Primarily diastolic CHF: Likely due to uncontrolled HTN. Last echo (8/12) with EF 45-50%, mild to moderate LVH with some asymmetric septal hypertrophy, RV normal size and systolic function. EF 50-55% by LV-gram in 6/12.   . Polyneuropathy in diabetes(357.2)   . Thoracic or lumbosacral neuritis or radiculitis, unspecified   . Calcifying tendinitis of shoulder   . LBBB (left bundle branch block)   . Chronic combined systolic and diastolic CHF (congestive heart failure)     EF 40-45% by echo 12/06/2012  . Coronary artery disease     questionable. LHC 05/2011 showing normal coronaries // Followed at [facility], Dr. Shirlee Latch  . GERD (gastroesophageal reflux disease)   . Shortness of breath   . Asthma   . Automatic implantable cardioverter-defibrillator in situ   . Stroke 12/2013    "my left side is paralyzed" (07/04/2014)  . Type II diabetes mellitus DX: 2002  . 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 Degeneration of lumbar or lumbosacral intervertebral disc   . Arthritis     "hips, back, legs, arms" (07/04/2014)  . Frequent UTI    Current Outpatient Prescriptions  Medication Sig Dispense Refill  . amLODipine (NORVASC)  5 MG tablet Take 1 tablet (5 mg total) by mouth daily.  30 tablet  3  . aspirin 325 MG tablet Take 1 tablet (325 mg total) by mouth daily.  30 tablet  5  . cyclobenzaprine (FLEXERIL) 10 MG tablet Take 10 mg by mouth 3 (three) times daily as needed for muscle spasms.      . diclofenac sodium (VOLTAREN) 1 % GEL Apply 2 g topically 4 (four) times daily as needed (pain).  3 Tube  5  . digoxin (LANOXIN) 0.125 MG tablet Take 0.125 mg by mouth daily.      . fluticasone (CUTIVATE) 0.05 % cream Apply topically 2 (two) times daily. Dispense two tubes  30 g  6  . furosemide (LASIX) 40 MG  tablet Take 40 mg by mouth daily.       . hydrALAZINE (APRESOLINE) 100 MG tablet Take 1 tablet (100 mg total) by mouth 3 (three) times daily.  90 tablet  3  . ibuprofen (ADVIL,MOTRIN) 200 MG tablet Take 400 mg by mouth 3 (three) times daily as needed for mild pain.      [name] insulin glargine (LANTUS) 100 UNIT/ML injection Inject 10-35 Units into the skin 2 (two) times daily. 10 units in am, 35 units in pm      . isosorbide dinitrate (ISORDIL) 20 MG tablet Take 1 tablet (20 mg total) by mouth 3 (three) times daily.  90 tablet  0  . metFORMIN (GLUCOPHAGE) 1000 MG tablet Take 1 tablet (1,000 mg total) by mouth daily with breakfast.  30 tablet  11  . metoprolol succinate (TOPROL-XL) 25 MG 24 hr tablet Take 50 mg (2 tablets) in the morning and 25 mg (1 tablet) in the evening.  90 tablet  3  . oxyCODONE-acetaminophen (PERCOCET) 10-325 MG per tablet Take 1 tablet by mouth every 8 (eight) hours as needed for pain.  90 tablet  0  . pantoprazole (PROTONIX) 40 MG tablet Take 40 mg by mouth daily.      . potassium chloride 20 MEQ TBCR Take 20 mEq by mouth daily.  90 tablet  3  . simvastatin (ZOCOR) 40 MG tablet Take 40 mg by mouth at bedtime.       [name] spironolactone (ALDACTONE) 25 MG tablet Take 1 tablet (25 mg total) by mouth daily.  30 tablet  0  . tamsulosin (FLOMAX) 0.4 MG CAPS capsule Take 1 capsule (0.4 mg total) by mouth daily.  30 capsule  1  . Vitamin D, Ergocalciferol, (DRISDOL) 50000 UNITS CAPS capsule Take 1 capsule (50,000 Units total) by mouth every 7 (seven) days.  6 capsule  0   No current facility-administered medications for this visit.    Review of Systems: General: Denies fever, chills, diaphoresis, appetite change and fatigue.  Respiratory: Denies SOB, DOE, cough, chest tightness, and wheezing.  Cardiovascular: Denies chest pain and palpitations.  Gastrointestinal: Denies nausea, vomiting, abdominal pain, diarrhea, constipation, blood in stool and abdominal distention.  Genitourinary:  Positive for urinary retention and urgency. Denies dysuria, frequency, hematuria, and flank pain.  Endocrine: Denies hot or cold intolerance, polyuria, and polydipsia.  Musculoskeletal: Positive for gait problem. Denies myalgias, back pain, joint swelling, arthralgias..  Skin: Denies pallor, rash and wounds.  Neurological: Positive for left-sided residual weakness, LUE weakness>LLE weakness. Denies dizziness, seizures, syncope, weakness, lightheadedness, numbness and headaches.  Psychiatric/Behavioral: Denies mood changes, confusion, nervousness, sleep disturbance and agitation.  Objective:   Physical Exam: Filed Vitals:   08/20/14 1433 08/20/14 1511  BP: 175/100  148/98  Pulse: 133 64  Temp: 98.2 F (36.8 C)   Height: 5\' 9"  (1.753 m)   Weight: 268 lb 12.8 oz (121.927 kg)   SpO2: 100%    General: Alert, cooperative, NAD. Mildly tearful on exam.  HEENT: PERRL, EOMI. Moist mucus membranes. Left-sided facial droop.  Neck: Full range of motion without pain, supple, no lymphadenopathy or carotid bruits Lungs: Clear to ascultation bilaterally, normal work of respiration, no wheezes, rales, rhonchi Heart: Tachycardia, regular rhythm, no murmurs, gallops, or rubs Abdomen: Soft, non-tender, non-distended, BS + Extremities: No cyanosis, clubbing, or edema. Left upper and lower extremity weakness.  Neurologic: Alert & oriented X3, cranial nerves II-XII intact, 1/5 strength in LUE, 4/5 in LLE. Otherwise strength intact. Sensory abnormalities noted over the LUE.   Assessment & Plan:   Please see problem based assessment and plan.

## 2014-08-21 ENCOUNTER — Encounter: Payer: Self-pay | Admitting: Internal Medicine

## 2014-08-21 ENCOUNTER — Ambulatory Visit (INDEPENDENT_AMBULATORY_CARE_PROVIDER_SITE_OTHER): Payer: Medicaid Other | Admitting: Internal Medicine

## 2014-08-21 VITALS — BP 161/109 | HR 96 | Ht 69.0 in | Wt 265.0 lb

## 2014-08-21 DIAGNOSIS — I428 Other cardiomyopathies: Secondary | ICD-10-CM

## 2014-08-21 DIAGNOSIS — I5042 Chronic combined systolic (congestive) and diastolic (congestive) heart failure: Secondary | ICD-10-CM

## 2014-08-21 LAB — MDC_IDC_ENUM_SESS_TYPE_INCLINIC
Battery Remaining Longevity: 81 mo
Battery Voltage: 2.99 V
Brady Statistic AP VP Percent: 0 %
Brady Statistic AS VP Percent: 96.38 %
Brady Statistic AS VS Percent: 3.62 %
Brady Statistic RA Percent Paced: 0.01 %
Brady Statistic RV Percent Paced: 50.47 %
Date Time Interrogation Session: 20150909154142
HIGH POWER IMPEDANCE MEASURED VALUE: 133 Ohm
HighPow Impedance: 54 Ohm
Lead Channel Impedance Value: 399 Ohm
Lead Channel Pacing Threshold Amplitude: 0.875 V
Lead Channel Pacing Threshold Amplitude: 0.875 V
Lead Channel Pacing Threshold Pulse Width: 0.4 ms
Lead Channel Pacing Threshold Pulse Width: 0.4 ms
Lead Channel Sensing Intrinsic Amplitude: 13.375 mV
Lead Channel Sensing Intrinsic Amplitude: 19.25 mV
Lead Channel Sensing Intrinsic Amplitude: 3.5 mV
Lead Channel Setting Pacing Amplitude: 2 V
Lead Channel Setting Pacing Amplitude: 2 V
Lead Channel Setting Pacing Amplitude: 2.5 V
Lead Channel Setting Pacing Pulse Width: 0.4 ms
Lead Channel Setting Sensing Sensitivity: 0.3 mV
MDC IDC MSMT LEADCHNL LV PACING THRESHOLD PULSEWIDTH: 0.4 ms
MDC IDC MSMT LEADCHNL RA PACING THRESHOLD AMPLITUDE: 0.5 V
MDC IDC MSMT LEADCHNL RA SENSING INTR AMPL: 3.125 mV
MDC IDC MSMT LEADCHNL RV IMPEDANCE VALUE: 418 Ohm
MDC IDC SET LEADCHNL RV PACING PULSEWIDTH: 0.4 ms
MDC IDC SET ZONE DETECTION INTERVAL: 300 ms
MDC IDC SET ZONE DETECTION INTERVAL: 400 ms
MDC IDC STAT BRADY AP VS PERCENT: 0 %
Zone Setting Detection Interval: 250 ms
Zone Setting Detection Interval: 370 ms

## 2014-08-21 LAB — GLUCOSE, CAPILLARY: Glucose-Capillary: 144 mg/dL — ABNORMAL HIGH (ref 70–99)

## 2014-08-21 MED ORDER — AMLODIPINE BESYLATE 5 MG PO TABS: 5.0000 mg | ORAL_TABLET | Freq: Every day | ORAL | Status: DC

## 2014-08-21 MED ORDER — METOPROLOL SUCCINATE ER 25 MG PO TB24: ORAL_TABLET | ORAL | Status: DC

## 2014-08-21 MED ORDER — ISOSORBIDE MONONITRATE ER 60 MG PO TB24: 60.0000 mg | ORAL_TABLET | ORAL | Status: DC

## 2014-08-21 MED ORDER — FUROSEMIDE 40 MG PO TABS: 40.0000 mg | ORAL_TABLET | Freq: Every day | ORAL | Status: DC | PRN

## 2014-08-21 NOTE — Progress Notes (Signed)
Patient Care Team: Courtney Paris, MD as PCP - General   HPI  [name] is a 48 y.o. female Seen in followup for CRT-D implantation for nonischemic cardiomyopathy and left bundle branch block with congestive heart failure; this was done 9/14.    She has had intercurrent hospitalization for chest pain with a negative evaluation for pulmonary embolism and myocardial ischemia.  Her breathing has been significantly better since CRT. Ejection fraction was 20% prior to CRT in 40% shortly thereafter.  She had a stroke 2015. She underwent TEE; this demonstrated moderate LAE and LVEF of 35-40%  in the wake of this her aspirin was increased to 325 mg. This has left dense hemiparesis  She also significant hypertension  Denies shortness of breath     Past Medical History  Diagnosis Date  . Hypertension     Poorly controlled. Has had HTN since age 79. Angioedema with ACEI.  24 Hr urine and renal arterial dopplers ordered . . . Never done  . NICM (nonischemic cardiomyopathy)     EF 45-50% in 8/12, cath 6/12 showed normal coronaries, EF 50-55% by LV gram  . Morbid obesity   . Allergic rhinitis   . HLD (hyperlipidemia)   . OSA on CPAP     sleep study in 8/12 showed moderate to severe OSA requiring CPAP  . Chronic lower back pain     secondary to DJD, obsetiy, hip problems. Followed by Dr. Ivory Broad (pain management)  . Chronic diastolic heart failure      Primarily diastolic CHF: Likely due to uncontrolled HTN. Last echo (8/12) with EF 45-50%, mild to moderate LVH with some asymmetric septal hypertrophy, RV normal size and systolic function. EF 50-55% by LV-gram in 6/12.   . Polyneuropathy in diabetes(357.2)   . Thoracic or lumbosacral neuritis or radiculitis, unspecified   . Calcifying tendinitis of shoulder   . LBBB (left bundle branch block)   . Chronic combined systolic and diastolic CHF (congestive heart failure)     EF 40-45% by echo 12/06/2012  . Coronary artery  disease     questionable. LHC 05/2011 showing normal coronaries // Followed at [facility], Dr. Shirlee Latch  . GERD (gastroesophageal reflux disease)   . Shortness of breath   . Asthma   . Automatic implantable cardioverter-defibrillator in situ   . Stroke 12/2013    "my left side is paralyzed" (07/04/2014)  . Type II diabetes mellitus DX: 2002  . 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 Degeneration of lumbar or lumbosacral intervertebral disc   . Arthritis     "hips, back, legs, arms" (07/04/2014)  . Frequent UTI     Past Surgical History  Procedure Laterality Date  . Tubal ligation  05/31/1985  . Carpal tunnel release Left   . Hemiarthroplasty shoulder fracture Right 1980's  . Multiple tooth extractions  ~ 2011    tumors removed ; "my whole top"  . Cardiac catheterization  05/2011  . Tee without cardioversion N/A 01/14/2014    Procedure: TRANSESOPHAGEAL ECHOCARDIOGRAM (TEE);  Surgeon: Thurmon Fair, MD;  Location: [facility] ENDOSCOPY;  Service: Cardiovascular;  Laterality: N/A;  . Breast surgery Bilateral 2011    patient reports benign results  . Bi-ventricular implantable cardioverter defibrillator  (crt-d)  08/2013    [name] 08/23/2013    Current Outpatient Prescriptions  Medication Sig Dispense Refill  . aspirin 325 MG tablet Take 1 tablet (325 mg total) by  mouth daily.  30 tablet  5  . cyclobenzaprine (FLEXERIL) 10 MG tablet Take 10 mg by mouth 3 (three) times daily as needed for muscle spasms.      . diclofenac sodium (VOLTAREN) 1 % GEL Apply 2 g topically 4 (four) times daily as needed (pain).  3 Tube  5  . digoxin (LANOXIN) 0.125 MG tablet Take 0.125 mg by mouth daily.      . fluticasone (CUTIVATE) 0.05 % cream Apply topically 2 (two) times daily. Dispense two tubes  30 g  6  . furosemide (LASIX) 40 MG tablet Take 1 tablet (40 mg total) by mouth daily as needed.      . hydrALAZINE (APRESOLINE) 100 MG tablet Take 1 tablet (100 mg total) by mouth 3 (three) times  daily.  90 tablet  3  . insulin glargine (LANTUS) 100 UNIT/ML injection Inject 10-35 Units into the skin 2 (two) times daily. 10 units in am, 35 units in pm      . isosorbide mononitrate (IMDUR) 60 MG 24 hr tablet Take 1 tablet (60 mg total) by mouth every morning.  30 tablet  11  . metFORMIN (GLUCOPHAGE) 1000 MG tablet Take 1,000 mg by mouth 2 (two) times daily with a meal.      . metoprolol succinate (TOPROL-XL) 25 MG 24 hr tablet Take 50 mg (2 tablets) in the morning and 25 mg (1 tablet) in the evening.  90 tablet  3  . oxyCODONE-acetaminophen (PERCOCET) 10-325 MG per tablet Take 1 tablet by mouth every 8 (eight) hours as needed for pain.  90 tablet  0  . pantoprazole (PROTONIX) 40 MG tablet Take 40 mg by mouth daily.      . potassium chloride 20 MEQ TBCR Take 20 mEq by mouth daily.  90 tablet  3  . simvastatin (ZOCOR) 40 MG tablet Take 40 mg by mouth at bedtime.       . tamsulosin (FLOMAX) 0.4 MG CAPS capsule Take 1 capsule (0.4 mg total) by mouth daily.  30 capsule  1  . Vitamin D, Ergocalciferol, (DRISDOL) 50000 UNITS CAPS capsule Take 1 capsule (50,000 Units total) by mouth every 7 (seven) days.  6 capsule  0   No current facility-administered medications for this visit.    Allergies  Allergen Reactions  . Clindamycin Anaphylaxis and Swelling  . Ace Inhibitors Swelling  . Enalapril Maleate Swelling  . Lisinopril Swelling    Review of Systems negative except from HPI and PMH  Physical Exam BP 170/100  Pulse 98  Ht 5\' 9"  (1.753 m)  Wt 265 lb (120.203 kg)  BMI 39.12 kg/m2  LMP 07/09/2014 Well developed and well nourished in no acute distress HENT normal E scleral and icterus clear Neck Supple JVP flat; carotids brisk and full Clear to ausculation  Regular rate and rhythm, no murmurs gallops or rub Soft with active bowel sounds No clubbing cyanosis none Edema Alert and oriented, Left hemiparesisi Skin Warm and Dry  eCG  P-synchronous/ AV  pacing   Assessment and   Plan NICM stable on current meds  ICD The patient's device was interrogated.  The information was reviewed. No changes were made in the programming.     Hypertension   Sinus Tachycardia  Will increase her metoprolol 25/50>>50 bid Resume amlodipine at 5 mg  followup with pcp   Continue other meds

## 2014-08-21 NOTE — Assessment & Plan Note (Signed)
Lab Results  Component Value Date   HGBA1C 8.3 07/03/2014   HGBA1C 12.1 03/25/2014   HGBA1C 10.7 12/19/2013     Assessment: Diabetes control:  Moderately well controlled. Progress toward A1C goal:   Unchanged Comments: Reviewed home CBG monitoring; average around 160-180, however, she did have ONE outlier in the 400s, otherwise most readings below 200.   Plan: Medications:  continue current medications; Lantus 10 in AM 35 in PM, Metformin 1000 mg daily.  Instruction/counseling given: reminded to bring blood glucose meter & log to each visit Educational resources provided: brochure Self management tools provided: copy of home glucose meter download;home glucose logbook Other plans: return to clinic in 1 week. Otherwise, patient due for HbA1c recheck in 09/2014

## 2014-08-21 NOTE — Patient Instructions (Signed)
Your physician has recommended you make the following change in your medication:  1) RESUME Amlodipine 5 mg daily 2) INCREASE Metoprolol Succinate to 50 mg twice daily  Remote monitoring is used to monitor your Pacemaker of ICD from home. This monitoring reduces the number of office visits required to check your device to one time per year. It allows [place] to keep an eye on the functioning of your device to ensure it is working properly. You are scheduled for a device check from home on 11/25/14. You may send your transmission at any time that day. If you have a wireless device, the transmission will be sent automatically. After your physician reviews your transmission, you will receive a postcard with your next transmission date.  Your physician wants you to follow-up in: 1 year with Dr. Graciela Husbands.  You will receive a reminder letter in the mail two months in advance. If you don't receive a letter, please call our office to schedule the follow-up appointment.

## 2014-08-21 NOTE — Progress Notes (Signed)
Case discussed with Dr. Jones at the time of the visit.  We reviewed the resident's history and exam and pertinent patient test results.  I agree with the assessment, diagnosis, and plan of care documented in the resident's note. 

## 2014-08-21 NOTE — Assessment & Plan Note (Addendum)
BP Readings from Last 3 Encounters:  08/20/14 148/98  08/07/14 152/92  07/31/14 140/95    Lab Results  Component Value Date   NA 136* 08/20/2014   K 3.8 08/20/2014   CREATININE 0.56 08/20/2014    Assessment: Comments: Initially 175/100 as patient walked from far end of hospital to her appointment. Also had pulse of 133 initially. Rechecked BP, shown to be 148/98. Currently taking Hydralazine 100 mg tid, Imdur 60 mg qd, Toprol-XL 50 in AM, 25 in PM, and Lasix 40 mg prn when edema or weight gain. Recently seen at CHF clinic as well, changes made to meds as discussed in [facility]'s note.   Plan: Medications:  continue current medications; Hydralazine 100 mg tid, Imdur 60 mg qd, Toprol-XL 50 in AM, 25 in PM, and Lasix 40 mg prn Educational resources provided: brochure Self management tools provided: home blood pressure logbook Other plans: May need further adjustments made to BP meds if continues to be significantly elevated at next visit.  Return to clinic in 1 week for follow up

## 2014-08-21 NOTE — Assessment & Plan Note (Addendum)
Patient presenting w/ complaints of urinary retention. She states she has had this issue since her CVA in 12/2013, however, she claims it has been worse ove the past 3 weeks. She claims she sits down to go to the bathroom and has a hard time going, then has significant urgency and incontinence to follow. This has been very distressing and embarrassing for her. She wears briefs but still has issues with this. Patient attempted to urinate in the clinic and was unable. Bladder scan only revealed 175 cc urine. Do not suspect that patient has renal complications/hydronephrosis 2/2 urinary retention, but do feel that she likely has element of neurogenic bladder for her stroke. Additionally, she is taking Flexeril which can induce urinary retention, however she has been taking this for quite some time. She also used to take Tegretol (also known cause of urinary retention), but does not any more.  -Will try Flomax 0.4 mg daily for now (has taken in the past) -Urology referral -Return to clinic in 1 week for follow up to determine if Flomax has helped and BP recheck -Do not feel Oxybutinin would help in this situation, would likely contribute further to retention issues.

## 2014-08-22 ENCOUNTER — Encounter: Payer: Self-pay | Admitting: Internal Medicine

## 2014-08-22 ENCOUNTER — Ambulatory Visit (HOSPITAL_COMMUNITY)
Admission: RE | Admit: 2014-08-22 | Discharge: 2014-08-22 | Disposition: A | Discharge status: Home / Self Care | Payer: Medicaid Other | Source: Ambulatory Visit | Attending: Internal Medicine | Admitting: Internal Medicine

## 2014-08-22 DIAGNOSIS — Z1231 Encounter for screening mammogram for malignant neoplasm of breast: Secondary | ICD-10-CM | POA: Insufficient documentation

## 2014-08-22 DIAGNOSIS — Z0000 Encounter for general adult medical examination without abnormal findings: Secondary | ICD-10-CM

## 2014-08-23 NOTE — Telephone Encounter (Signed)
Spoke w/Anne, pt's BP has been very elevated (170s/110s) but medications were adjusted by Dr Graciela Husbands on 9/9, BP yesterday was 156/95 adn 180/109 today she will continue to monitor and fax [place] readings

## 2014-08-27 ENCOUNTER — Encounter: Payer: Self-pay | Admitting: Internal Medicine

## 2014-08-27 ENCOUNTER — Telehealth: Payer: Self-pay | Admitting: *Deleted

## 2014-08-27 ENCOUNTER — Ambulatory Visit (INDEPENDENT_AMBULATORY_CARE_PROVIDER_SITE_OTHER): Payer: Medicaid Other | Admitting: Internal Medicine

## 2014-08-27 VITALS — BP 157/71 | HR 95 | Temp 97.9°F | Ht 69.0 in | Wt 266.4 lb

## 2014-08-27 DIAGNOSIS — I10 Essential (primary) hypertension: Secondary | ICD-10-CM

## 2014-08-27 DIAGNOSIS — N3946 Mixed incontinence: Secondary | ICD-10-CM

## 2014-08-27 LAB — GLUCOSE, CAPILLARY: GLUCOSE-CAPILLARY: 170 mg/dL — AB (ref 70–99)

## 2014-08-27 MED ORDER — INCONTINENCE SUPPLIES MISC: Status: DC

## 2014-08-27 NOTE — Progress Notes (Signed)
Subjective:   Patient ID: [name] female   DOB: [birth year] 48 y.o.   MRN: [id number]  HPI: [name] is a 48 y.o. female w/ PMHx of HTN, HLD, uncontrolled DM type II, OSA, CAD, CHF, and h/o CVA w/ left-sided residual weakness, presents to the clinic today for follow up. [name] was seen in the clinic last week for complaints of urinary retention and incontinence. She was given Flomax 0.4 mg qd and a urology referral. However, since that time, she claims her urinary symptoms have almost completely resolved, without starting the Flomax. She denies any dysuria, hematuria, increased frequency, and says her feelings of urgency have improved. She does still describe symptoms of stress incontinence however, involving coughing and sneezing.   Additionally, the patient's BP was elevated at her last visit initially to 175/100 w/ significant tachycardia. This seemed to almost resolve prior to her leaving her appointment, however, I felt it necessary for her to return this week for follow up. In the mean time, the patient had an appointment w/ Dr. Graciela Husbands on 08/21/14 who increased her Toprol-XL to 50 mg BID (instead of 25/50) and added back her Norvasc 5 mg. Since that time, she claims her BP and her HR has been better controlled.   She denies any other complaints.   Past Medical History  Diagnosis Date  . Hypertension     Poorly controlled. Has had HTN since age 57. Angioedema with ACEI.  24 Hr urine and renal arterial dopplers ordered . . . Never done  . NICM (nonischemic cardiomyopathy)     EF 45-50% in 8/12, cath 6/12 showed normal coronaries, EF 50-55% by LV gram  . Morbid obesity   . Allergic rhinitis   . HLD (hyperlipidemia)   . OSA on CPAP     sleep study in 8/12 showed moderate to severe OSA requiring CPAP  . Chronic lower back pain     secondary to DJD, obsetiy, hip problems. Followed by Dr. Ivory Broad (pain management)  . Chronic diastolic heart failure      Primarily  diastolic CHF: Likely due to uncontrolled HTN. Last echo (8/12) with EF 45-50%, mild to moderate LVH with some asymmetric septal hypertrophy, RV normal size and systolic function. EF 50-55% by LV-gram in 6/12.   . Polyneuropathy in diabetes(357.2)   . Thoracic or lumbosacral neuritis or radiculitis, unspecified   . Calcifying tendinitis of shoulder   . LBBB (left bundle branch block)   . Chronic combined systolic and diastolic CHF (congestive heart failure)     EF 40-45% by echo 12/06/2012  . Coronary artery disease     questionable. LHC 05/2011 showing normal coronaries // Followed at [facility], Dr. Shirlee Latch  . GERD (gastroesophageal reflux disease)   . Shortness of breath   . Asthma   . Automatic implantable cardioverter-defibrillator in situ   . Stroke 12/2013    "my left side is paralyzed" (07/04/2014)  . Type II diabetes mellitus DX: 2002  . 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 Degeneration of lumbar or lumbosacral intervertebral disc   . Arthritis     "hips, back, legs, arms" (07/04/2014)  . Frequent UTI    Current Outpatient Prescriptions  Medication Sig Dispense Refill  . amLODipine (NORVASC) 5 MG tablet Take 1 tablet (5 mg total) by mouth daily.  30 tablet  3  . aspirin 325 MG tablet Take 1 tablet (325 mg total) by mouth  daily.  30 tablet  5  . cyclobenzaprine (FLEXERIL) 10 MG tablet Take 10 mg by mouth 3 (three) times daily as needed for muscle spasms.      . diclofenac sodium (VOLTAREN) 1 % GEL Apply 2 g topically 4 (four) times daily as needed (pain).  3 Tube  5  . digoxin (LANOXIN) 0.125 MG tablet Take 0.125 mg by mouth daily.      . fluticasone (CUTIVATE) 0.05 % cream Apply topically 2 (two) times daily. Dispense two tubes  30 g  6  . furosemide (LASIX) 40 MG tablet Take 1 tablet (40 mg total) by mouth daily as needed.      . hydrALAZINE (APRESOLINE) 100 MG tablet Take 1 tablet (100 mg total) by mouth 3 (three) times daily.  90 tablet  3  .  insulin glargine (LANTUS) 100 UNIT/ML injection Inject 10-35 Units into the skin 2 (two) times daily. 10 units in am, 35 units in pm      . isosorbide mononitrate (IMDUR) 60 MG 24 hr tablet Take 1 tablet (60 mg total) by mouth every morning.  30 tablet  11  . metFORMIN (GLUCOPHAGE) 1000 MG tablet Take 1,000 mg by mouth 2 (two) times daily with a meal.      . metoprolol succinate (TOPROL-XL) 25 MG 24 hr tablet Take 50 mg (2 tablets) twice daily.  180 tablet  3  . oxyCODONE-acetaminophen (PERCOCET) 10-325 MG per tablet Take 1 tablet by mouth every 8 (eight) hours as needed for pain.  90 tablet  0  . pantoprazole (PROTONIX) 40 MG tablet Take 40 mg by mouth daily.      . potassium chloride 20 MEQ TBCR Take 20 mEq by mouth daily.  90 tablet  3  . simvastatin (ZOCOR) 40 MG tablet Take 40 mg by mouth at bedtime.       . tamsulosin (FLOMAX) 0.4 MG CAPS capsule Take 1 capsule (0.4 mg total) by mouth daily.  30 capsule  1  . Vitamin D, Ergocalciferol, (DRISDOL) 50000 UNITS CAPS capsule Take 1 capsule (50,000 Units total) by mouth every 7 (seven) days.  6 capsule  0   No current facility-administered medications for this visit.    Review of Systems: General: Denies fever, chills, diaphoresis, appetite change and fatigue.  Respiratory: Denies SOB, DOE, cough, chest tightness, and wheezing.  Cardiovascular: Denies chest pain and palpitations.  Gastrointestinal: Denies nausea, vomiting, abdominal pain, diarrhea, constipation, blood in stool and abdominal distention.  Genitourinary: Denies dysuria, frequency, hematuria, and flank pain.  Endocrine: Denies hot or cold intolerance, polyuria, and polydipsia.  Musculoskeletal: Positive for gait problem. Denies myalgias, back pain, joint swelling, arthralgias..  Skin: Denies pallor, rash and wounds.  Neurological: Positive for left-sided residual weakness, LUE weakness>LLE weakness. Denies dizziness, seizures, syncope, weakness, lightheadedness, numbness and  headaches.  Psychiatric/Behavioral: Denies mood changes, confusion, nervousness, sleep disturbance and agitation.  Objective:   Physical Exam: Filed Vitals:   08/27/14 1412  BP: 157/71  Pulse: 95  Temp: 97.9 F (36.6 C)  TempSrc: Oral  Height: 5\' 9"  (1.753 m)  Weight: 266 lb 6.4 oz (120.838 kg)  SpO2: 100%   General: Alert, cooperative, NAD.  HEENT: PERRL, EOMI. Moist mucus membranes. Left-sided facial droop.  Neck: Full range of motion without pain, supple, no lymphadenopathy or carotid bruits Lungs: Clear to ascultation bilaterally, normal work of respiration, no wheezes, rales, rhonchi Heart: Tachycardia, regular rhythm, no murmurs, gallops, or rubs Abdomen: Soft, non-tender, non-distended, BS + Extremities: No cyanosis,  clubbing, or edema. Left upper and lower extremity weakness.  Neurologic: Alert & oriented X3, cranial nerves II-XII intact, 1/5 strength in LUE, 4/5 in LLE. Otherwise strength intact. Sensory abnormalities noted over the LUE.   Assessment & Plan:   Please see problem based assessment and plan.

## 2014-08-27 NOTE — Assessment & Plan Note (Signed)
Patient had complaints of urinary retention w/ what sounded like overflow incontinence during her last visit. She was Prescribed Flomax 0.4 mg qd and given a referral for urology for further management. However, since that time, she claims her symptoms of retention and urge had subsided WITHOUT starting the Flomax. She denies any other significant complaints except for stress incontinence involving laughing, sneezing, and coughing.  -Continue w/ referral to urology -Patient to start Flomax 0.4 mg po qd if symptoms return and instructed her to inform the clinic. -RTC in 6 weeks.

## 2014-08-27 NOTE — Assessment & Plan Note (Signed)
BP Readings from Last 3 Encounters:  08/27/14 157/71  08/21/14 161/109  08/20/14 148/98    Lab Results  Component Value Date   NA 136* 08/20/2014   K 3.8 08/20/2014   CREATININE 0.56 08/20/2014    Assessment: Comments: BP better controlled.  Plan: Medications:  continue current medications; Toprol-XL 50 mg BID, Hydralazine 100 mg TID, Imdur 60 mg qd, Norvasc 5 mg qd, and Lasix 40 mg prn for increased weight.  Educational resources provided: brochure Self management tools provided:   Other plans: RTC in 6 weeks.

## 2014-08-27 NOTE — Patient Instructions (Signed)
1. Please schedule a follow up appointment for 6 weeks.  2. Please take all medications as prescribed.   Start taking Flomax 0.4 mg daily if you have symptoms of urinary retention and incontinence like before.  Please still schedule an appointment w/ Urology to discuss your urge/stress incontinence.   3. If you have worsening of your symptoms or new symptoms arise, please call the clinic ([phone]), or go to the ER immediately if symptoms are severe.  You have done a great job in taking all your medications. I appreciate it very much. Please continue doing that.

## 2014-08-27 NOTE — Telephone Encounter (Signed)
error 

## 2014-08-29 ENCOUNTER — Telehealth: Payer: Self-pay

## 2014-08-29 MED ORDER — DICLOFENAC SODIUM 1 % TD GEL: 2.0000 g | Freq: Four times a day (QID) | TRANSDERMAL | Status: DC | PRN

## 2014-08-29 NOTE — Telephone Encounter (Signed)
Patient is requesting a refill on Voltaren Gel and Vitamin D. Voltaren Gel refill sent to pharmacy. Is it okay to refill the Vitamin D?

## 2014-08-30 ENCOUNTER — Other Ambulatory Visit: Payer: Self-pay | Admitting: *Deleted

## 2014-08-30 NOTE — Telephone Encounter (Signed)
The Rx for Imdur 60 mg 24 hour tab was never called or sent to pharmacy.  I gave the Verbal Order for it but wanted to be sure that's what you wanted as the last Rx was for isosorbide dinitrate 20 mg tid.  I am just double checking.

## 2014-08-30 NOTE — Telephone Encounter (Signed)
Yes, please.

## 2014-09-02 MED ORDER — VITAMIN D (ERGOCALCIFEROL) 1.25 MG (50000 UNIT) PO CAPS: 50000.0000 [IU] | ORAL_CAPSULE | ORAL | Status: DC

## 2014-09-02 NOTE — Progress Notes (Signed)
Internal Medicine Clinic Attending  Case discussed with Dr. Jones at the time of the visit.  We reviewed the resident's history and exam and pertinent patient test results.  I agree with the assessment, diagnosis, and plan of care documented in the resident's note.  

## 2014-09-03 ENCOUNTER — Ambulatory Visit: Payer: Self-pay | Admitting: Nurse Practitioner

## 2014-09-09 ENCOUNTER — Telehealth: Payer: Self-pay | Admitting: *Deleted

## 2014-09-09 DIAGNOSIS — G8114 Spastic hemiplegia affecting left nondominant side: Secondary | ICD-10-CM

## 2014-09-09 DIAGNOSIS — E1142 Type 2 diabetes mellitus with diabetic polyneuropathy: Secondary | ICD-10-CM

## 2014-09-09 DIAGNOSIS — I5042 Chronic combined systolic (congestive) and diastolic (congestive) heart failure: Secondary | ICD-10-CM

## 2014-09-09 DIAGNOSIS — I63319 Cerebral infarction due to thrombosis of unspecified middle cerebral artery: Secondary | ICD-10-CM

## 2014-09-09 MED ORDER — OXYCODONE-ACETAMINOPHEN 10-325 MG PO TABS: 1.0000 | ORAL_TABLET | Freq: Three times a day (TID) | ORAL | Status: DC | PRN

## 2014-09-09 NOTE — Telephone Encounter (Signed)
Called for refill on prescriptions - they are due 09/12/2014

## 2014-09-09 NOTE — Telephone Encounter (Addendum)
rx printed for [name] to sign.  Lillia notified

## 2014-09-11 ENCOUNTER — Encounter: Payer: Self-pay | Admitting: *Deleted

## 2014-09-11 ENCOUNTER — Ambulatory Visit (INDEPENDENT_AMBULATORY_CARE_PROVIDER_SITE_OTHER): Payer: Medicaid Other | Admitting: Nurse Practitioner

## 2014-09-11 ENCOUNTER — Encounter: Payer: Self-pay | Admitting: Nurse Practitioner

## 2014-09-11 ENCOUNTER — Encounter (INDEPENDENT_AMBULATORY_CARE_PROVIDER_SITE_OTHER): Payer: Self-pay

## 2014-09-11 VITALS — BP 142/90 | HR 94 | Temp 97.3°F | Ht 67.0 in | Wt 267.0 lb

## 2014-09-11 DIAGNOSIS — G8114 Spastic hemiplegia affecting left nondominant side: Secondary | ICD-10-CM

## 2014-09-11 DIAGNOSIS — G8110 Spastic hemiplegia affecting unspecified side: Secondary | ICD-10-CM

## 2014-09-11 DIAGNOSIS — R5381 Other malaise: Secondary | ICD-10-CM

## 2014-09-11 DIAGNOSIS — G4733 Obstructive sleep apnea (adult) (pediatric): Secondary | ICD-10-CM

## 2014-09-11 DIAGNOSIS — R531 Weakness: Secondary | ICD-10-CM

## 2014-09-11 DIAGNOSIS — Z8673 Personal history of transient ischemic attack (TIA), and cerebral infarction without residual deficits: Secondary | ICD-10-CM

## 2014-09-11 DIAGNOSIS — R5383 Other fatigue: Secondary | ICD-10-CM

## 2014-09-11 NOTE — Progress Notes (Signed)
PATIENT: [name] DOB: 10-09-1966  REASON FOR VISIT: 1st hospital follow up for stroke HISTORY FROM: patient  HISTORY OF PRESENT ILLNESS: [name] is an 48 y.o. female, right handed, with a past medical history significant for HTN, DM uncontrolled, hyperlipidemia, morbid obesity, nonischemic cardiomyopathy, chronic diastolic heart failure, CAD, s/p pacemaker placement, OSA on CPAP, DJD, GERD, who suffered a cardio embolic right MCA stroke on 01/08/14.  She comes to the office today for first hospital follow up post hospital discharge for stroke.  Her symptoms started the night before admission with slurred speech, left sided facial droop and left arm numbness.  She had previous stroke workup October 2014 which was negative, symptom was speech difficulty.  She was unable to have MRI secondary to pacemaker.  CTA showed right MCA infarct with occluded branch MCA vessel.  Negative CTA of the neck.  Hgb a1c was 10.7, LDL 105. She has chronic pain syndrome managed by outpatient rehab. Her left arm numbness worsened through the night and eventually affected the left arm and leg. She was recommended for CIR and spent 3-4 weeks there. She was ultimately discharged to home. She has been walking more at home using her walker and cane, needs wheelchair for longer distances. She feels better after no longer taking Tegretol and Tizanidine, as she states she was sleeping all day. Blood pressure is under good control and she is followed closely at the heart failure clinic. Her diabetes is under much better control, last Hgb a1c was around 7. She has never smoked. She is tolerating Plavix and aspirin well with no signs of significant bleeding or bruising.  REVIEW OF SYSTEMS: Full 14 system review of systems performed and notable only for: runny nose, sleep apnea on cpap, daytime sleepiness, joiunt pain, joint swelling, back pain, aching muscles, walking difficulty, anxiety   ALLERGIES: Allergies    Allergen Reactions  . Clindamycin Anaphylaxis and Swelling  . Ace Inhibitors Swelling  . Enalapril Maleate Swelling  . Lisinopril Swelling    HOME MEDICATIONS: Outpatient Prescriptions Prior to Visit  Medication Sig Dispense Refill  . amLODipine (NORVASC) 5 MG tablet Take 1 tablet (5 mg total) by mouth daily.  30 tablet  3  . aspirin 325 MG tablet Take 1 tablet (325 mg total) by mouth daily.  30 tablet  5  . cyclobenzaprine (FLEXERIL) 10 MG tablet Take 10 mg by mouth 3 (three) times daily as needed for muscle spasms.      . diclofenac sodium (VOLTAREN) 1 % GEL Apply 2 g topically 4 (four) times daily as needed (pain).  3 Tube  5  . digoxin (LANOXIN) 0.125 MG tablet Take 0.125 mg by mouth daily.      . fluticasone (CUTIVATE) 0.05 % cream Apply topically 2 (two) times daily. Dispense two tubes  30 g  6  . furosemide (LASIX) 40 MG tablet Take 1 tablet (40 mg total) by mouth daily as needed.      . hydrALAZINE (APRESOLINE) 100 MG tablet Take 1 tablet (100 mg total) by mouth 3 (three) times daily.  90 tablet  3  . Incontinence Supplies MISC 1 year supply of Pull-ups in size XL Diagnosis Code: 788.33  1 each  0  . insulin glargine (LANTUS) 100 UNIT/ML injection Inject 10-35 Units into the skin 2 (two) times daily. 10 units in am, 35 units in pm      . isosorbide mononitrate (IMDUR) 60 MG 24 hr tablet Take 1 tablet (60  mg total) by mouth every morning.  30 tablet  11  . metFORMIN (GLUCOPHAGE) 1000 MG tablet Take 1,000 mg by mouth 2 (two) times daily with a meal.      . metoprolol succinate (TOPROL-XL) 25 MG 24 hr tablet Take 50 mg (2 tablets) twice daily.  180 tablet  3  . oxyCODONE-acetaminophen (PERCOCET) 10-325 MG per tablet Take 1 tablet by mouth every 8 (eight) hours as needed for pain.  90 tablet  0  . pantoprazole (PROTONIX) 40 MG tablet Take 40 mg by mouth daily.      . potassium chloride 20 MEQ TBCR Take 20 mEq by mouth daily.  90 tablet  3  . simvastatin (ZOCOR) 40 MG tablet Take 40  mg by mouth at bedtime.       . tamsulosin (FLOMAX) 0.4 MG CAPS capsule Take 1 capsule (0.4 mg total) by mouth daily.  30 capsule  1  . Vitamin D, Ergocalciferol, (DRISDOL) 50000 UNITS CAPS capsule Take 1 capsule (50,000 Units total) by mouth every 7 (seven) days.  24 capsule  0   No facility-administered medications prior to visit.    PHYSICAL EXAM Filed Vitals:   09/11/14 0941  BP: 142/90  Pulse: 94  Temp: 97.3 F (36.3 C)  TempSrc: Oral  Height: 5\' 7"  (1.702 m)  Weight: 267 lb (121.11 kg)   Body mass index is 41.81 kg/(m^2).  Generalized: Well developed, in no acute distress  Head: normocephalic and atraumatic. Oropharynx benign  Neck: Supple, no carotid bruits  Cardiac: Regular rate rhythm, no murmur  Musculoskeletal: left foot drop-left AFO brace  Neurological examination  Mentation: Alert oriented to time, place, history taking. Follows all commands speech and language fluent, Speech slightly dysarthric. Cranial nerve II-XII: Fundoscopic exam not done. Pupils were equal round reactive to light extraocular movements were full, visual field were full on confrontational test. Facial sensation and strength were decreased on the left lower face. Hearing was intact to finger rubbing bilaterally. Uvula tongue midline. head turning and shoulder shrug and were normal and symmetric.Tongue protrusion into cheek strength was normal. Motor: The motor testing reveals 5 over 5 strength in the right extremities and 0/5 on the left.  Sensory: Sensory testing is diminished on the left. No evidence of extinction is noted. Sensory loss in a stocking pattern both legs. Coordination: Cerebellar testing reveals good finger-nose-finger and heel-to-shin on the right, unable on the left. Gait and station: Gait is not tested.  Reflexes: Deep tendon reflexes are 3+ on left. 2+ on the right NIHSS: 12 MRs: 3-4  ASSESSMENT: [name] is a 48 y.o. AA female who presented with left extremity  weakness and dysarthria on 01/09/14. TPA was not given as the patient was out of the window for treatment. A CT scan was consistent with right middle cerebral artery territory infarct. The patient cannot have an MRI secondary to permanent pacemaker/defibrillator. It was felt that the infarct could possibly be embolic secondary to a low ejection fraction. Residual stroke effect of left spastic hemiplegia.  PLAN: I had a long discussion with the patient and family regarding her recent stroke, discussed results of evaluation in the hospital and answered questions. Continue aspirin 325 mg orally every day  for secondary stroke prevention and maintain strict control of hypertension with blood pressure goal below 130/90, diabetes with hemoglobin A1c goal below 6.5% and lipids with LDL cholesterol goal below 70 mg/dL. Dr. Lars Masson for regular medical management of chronic problems. Continue to followup  with Dr. Wynn Banker at outpatient rehab center as advised. Followup in the future in 4 months, sooner as needed.  Caitlyn Asal Lyndsey Demos, MSN, FNP-BC, A/GNP-C 09/11/2014, 12:52 PM [facility] [address], [address] [place], [place] [address] [phone]  Note: This document was prepared with digital dictation and possible smart phrase technology. Any transcriptional errors that result from this process are unintentional.

## 2014-09-11 NOTE — Patient Instructions (Signed)
Continue aspirin 325 mg orally every day  for secondary stroke prevention and maintain strict control of hypertension with blood pressure goal below 130/90, diabetes with hemoglobin A1c goal below 6.5% and lipids with LDL cholesterol goal below 70 mg/dL. Dr. Lars Masson for regular medical management of chronic problems. Continue to followup with Dr. Wynn Banker at outpatient rehab center as advised. Followup in the future in 3-4 months sooner as needed.  Stroke Prevention Some medical conditions and behaviors are associated with an increased chance of having a stroke. You may prevent a stroke by making healthy choices and managing medical conditions. HOW CAN I REDUCE MY RISK OF HAVING A STROKE?   Stay physically active. Get at least 30 minutes of activity on most or all days.  Do not smoke. It may also be helpful to avoid exposure to secondhand smoke.  Limit alcohol use. Moderate alcohol use is considered to be:  No more than 2 drinks per day for men.  No more than 1 drink per day for nonpregnant women.  Eat healthy foods. This involves:  Eating 5 or more servings of fruits and vegetables a day.  Making dietary changes that address high blood pressure (hypertension), high cholesterol, diabetes, or obesity.  Manage your cholesterol levels.  Making food choices that are high in fiber and low in saturated fat, trans fat, and cholesterol may control cholesterol levels.  Take any prescribed medicines to control cholesterol as directed by your health care provider.  Manage your diabetes.  Controlling your carbohydrate and sugar intake is recommended to manage diabetes.  Take any prescribed medicines to control diabetes as directed by your health care provider.  Control your hypertension.  Making food choices that are low in salt (sodium), saturated fat, trans fat, and cholesterol is recommended to manage hypertension.  Take any prescribed medicines to control hypertension as directed  by your health care provider.  Maintain a healthy weight.  Reducing calorie intake and making food choices that are low in sodium, saturated fat, trans fat, and cholesterol are recommended to manage weight.  Stop drug abuse.  Avoid taking birth control pills.  Talk to your health care provider about the risks of taking birth control pills if you are over 65 years old, smoke, get migraines, or have ever had a blood clot.  Get evaluated for sleep disorders (sleep apnea).  Talk to your health care provider about getting a sleep evaluation if you snore a lot or have excessive sleepiness.  Take medicines only as directed by your health care provider.  For some people, aspirin or blood thinners (anticoagulants) are helpful in reducing the risk of forming abnormal blood clots that can lead to stroke. If you have the irregular heart rhythm of atrial fibrillation, you should be on a blood thinner unless there is a good reason you cannot take them.  Understand all your medicine instructions.  Make sure that other conditions (such as anemia or atherosclerosis) are addressed. SEEK IMMEDIATE MEDICAL CARE IF:   You have sudden weakness or numbness of the face, arm, or leg, especially on one side of the body.  Your face or eyelid droops to one side.  You have sudden confusion.  You have trouble speaking (aphasia) or understanding.  You have sudden trouble seeing in one or both eyes.  You have sudden trouble walking.  You have dizziness.  You have a loss of balance or coordination.  You have a sudden, severe headache with no known cause.  You have new chest  pain or an irregular heartbeat. Any of these symptoms may represent a serious problem that is an emergency. Do not wait to see if the symptoms will go away. Get medical help at once. Call your local emergency services (911 in [place] Do not drive yourself to the hospital. Document Released: 01/06/2005 Document Revised: 04/15/2014  Document Reviewed: 06/01/2013 [facility] Patient Information 2015 [place], [place]. This information is not intended to replace advice given to you by your health care provider. Make sure you discuss any questions you have with your health care provider.

## 2014-09-12 NOTE — Progress Notes (Signed)
I agree with the above plan 

## 2014-09-17 ENCOUNTER — Other Ambulatory Visit: Payer: Self-pay | Admitting: *Deleted

## 2014-09-17 NOTE — Telephone Encounter (Signed)
Refill request from pt's pharmacy for her pen needles and citalopram 20mg  tabs #30. The citalopram is no longer showing as an active medication, but pharmacy note states it was last dispensed on 09/03/2014 from a rx that was written 07/01/14. Will send to pcp for review, please advise.07/03/14, Zeev Deakins Cassady10/6/20154:38 PM

## 2014-09-18 ENCOUNTER — Encounter: Payer: Self-pay | Admitting: *Deleted

## 2014-09-19 MED ORDER — CITALOPRAM HYDROBROMIDE 20 MG PO TABS: 20.0000 mg | ORAL_TABLET | Freq: Every day | ORAL | Status: DC

## 2014-09-25 ENCOUNTER — Other Ambulatory Visit: Payer: Self-pay | Admitting: *Deleted

## 2014-09-25 NOTE — Telephone Encounter (Signed)
Request for test strips.  Not on med list.

## 2014-09-26 MED ORDER — GLUCOSE BLOOD VI STRP: ORAL_STRIP | Status: DC

## 2014-09-27 ENCOUNTER — Other Ambulatory Visit: Payer: Self-pay | Admitting: *Deleted

## 2014-09-27 MED ORDER — INSULIN PEN NEEDLE 31G X 5 MM MISC: Status: DC

## 2014-09-27 MED ORDER — GLUCOSE BLOOD VI STRP: ORAL_STRIP | Status: DC

## 2014-09-27 NOTE — Telephone Encounter (Addendum)
Pt request these and none are listed in med list.  I added what she has been using.  ALSO, you refilled the test strips yesterday but you always need to add Dx code and directions on how often to test.

## 2014-09-27 NOTE — Telephone Encounter (Signed)
Please read me note.  Need directions on Rx's

## 2014-09-30 ENCOUNTER — Other Ambulatory Visit: Payer: Self-pay | Admitting: *Deleted

## 2014-09-30 ENCOUNTER — Other Ambulatory Visit: Payer: Self-pay | Admitting: Internal Medicine

## 2014-09-30 ENCOUNTER — Encounter: Payer: Medicaid Other | Attending: Physical Medicine & Rehabilitation | Admitting: Physical Medicine & Rehabilitation

## 2014-09-30 ENCOUNTER — Encounter: Payer: Self-pay | Admitting: Physical Medicine & Rehabilitation

## 2014-09-30 VITALS — BP 161/98 | HR 98 | Resp 14

## 2014-09-30 DIAGNOSIS — Z8673 Personal history of transient ischemic attack (TIA), and cerebral infarction without residual deficits: Secondary | ICD-10-CM

## 2014-09-30 DIAGNOSIS — M791 Myalgia: Secondary | ICD-10-CM

## 2014-09-30 DIAGNOSIS — G8114 Spastic hemiplegia affecting left nondominant side: Secondary | ICD-10-CM

## 2014-09-30 DIAGNOSIS — I63319 Cerebral infarction due to thrombosis of unspecified middle cerebral artery: Secondary | ICD-10-CM | POA: Diagnosis present

## 2014-09-30 DIAGNOSIS — IMO0002 Reserved for concepts with insufficient information to code with codable children: Secondary | ICD-10-CM

## 2014-09-30 DIAGNOSIS — M47817 Spondylosis without myelopathy or radiculopathy, lumbosacral region: Secondary | ICD-10-CM

## 2014-09-30 DIAGNOSIS — E1342 Other specified diabetes mellitus with diabetic polyneuropathy: Secondary | ICD-10-CM | POA: Diagnosis present

## 2014-09-30 DIAGNOSIS — Z79899 Other long term (current) drug therapy: Secondary | ICD-10-CM

## 2014-09-30 DIAGNOSIS — E1142 Type 2 diabetes mellitus with diabetic polyneuropathy: Secondary | ICD-10-CM

## 2014-09-30 DIAGNOSIS — M7918 Myalgia, other site: Secondary | ICD-10-CM

## 2014-09-30 DIAGNOSIS — Z5181 Encounter for therapeutic drug level monitoring: Secondary | ICD-10-CM | POA: Diagnosis present

## 2014-09-30 DIAGNOSIS — E1140 Type 2 diabetes mellitus with diabetic neuropathy, unspecified: Secondary | ICD-10-CM

## 2014-09-30 DIAGNOSIS — G8110 Spastic hemiplegia affecting unspecified side: Secondary | ICD-10-CM

## 2014-09-30 DIAGNOSIS — G629 Polyneuropathy, unspecified: Secondary | ICD-10-CM

## 2014-09-30 DIAGNOSIS — M47816 Spondylosis without myelopathy or radiculopathy, lumbar region: Secondary | ICD-10-CM

## 2014-09-30 DIAGNOSIS — I5042 Chronic combined systolic (congestive) and diastolic (congestive) heart failure: Secondary | ICD-10-CM | POA: Diagnosis present

## 2014-09-30 DIAGNOSIS — E1165 Type 2 diabetes mellitus with hyperglycemia: Principal | ICD-10-CM

## 2014-09-30 MED ORDER — OXYCODONE-ACETAMINOPHEN 10-325 MG PO TABS: 1.0000 | ORAL_TABLET | Freq: Three times a day (TID) | ORAL | Status: DC | PRN

## 2014-09-30 MED ORDER — GLUCOSE BLOOD VI STRP: ORAL_STRIP | Status: DC

## 2014-09-30 MED ORDER — INSULIN PEN NEEDLE 31G X 5 MM MISC: Status: DC

## 2014-09-30 NOTE — Telephone Encounter (Signed)
Call from [facility]  - needs new rx with directions which I included;please check   Thanks

## 2014-09-30 NOTE — Patient Instructions (Signed)
Continue to work on your home stretching

## 2014-09-30 NOTE — Progress Notes (Signed)
Subjective:    Patient ID: [name], female    DOB: 02-21-1966, 48 y.o.   MRN: [id number]  HPI  [name] is back regarding her chronic left sided weakness and pain syndrome. She received her AFO and orthotic shoes.   As she was complaining of ongoing drowsiness her primary care team took her off the tegretol, baclofen, and gabapentin.   She has noticed an increase in her spasticity especially in the LUE although her fatigue is better.   She remains on the percocet for pain control. She denies excessive fatigue with the percocet.     Pain Inventory Average Pain 5 Pain Right Now 5 My pain is aching  In the last 24 hours, has pain interfered with the following? General activity 5 Relation with others 5 Enjoyment of life 7 What TIME of day is your pain at its worst? morning Sleep (in general) Fair  Pain is worse with: walking Pain improves with: medication Relief from Meds: n/a  Mobility use a cane use a wheelchair  Function disabled: date disabled . I need assistance with the following:  bathing, household duties and shopping  Neuro/Psych bladder control problems weakness numbness trouble walking  Prior Studies Any changes since last visit?  no  Physicians involved in your care Any changes since last visit?  no   Family History  Problem Relation Age of Onset  . Heart disease Mother 57    Died of MI at age 28 yo  . Heart disease Paternal Grandmother     requiring pacemaker.  [name] Heart disease Paternal Grandfather 74    Died of MI at possibly age 38-53yo  . Stroke Paternal Grandfather   . Heart disease Father 49    MI age 48yo requiring stenting  . Kidney disease Mother     requiring dialysis  . Congestive Heart Failure Mother   . Diabetes Father   . Diabetes Brother   . Heart disease Brother 26    MI at age 6 years old  . Breast cancer Paternal Aunt   . Breast cancer Maternal Grandmother    History   Social History  . Marital Status:  Single    Spouse Name: N/A    Number of Children: 2  . Years of Education: 9th grade   Occupational History  . Unemployed     planning on getting disability   Social History Main Topics  . Smoking status: Never Smoker   . Smokeless tobacco: Never Used  . Alcohol Use: No  . Drug Use: No  . Sexual Activity: Yes    Birth Control/ Protection: Surgical   Other Topics Concern  . None   Social History Narrative   Lives in [place] with her son. Is able to read and write fluently in [place].   Past Surgical History  Procedure Laterality Date  . Tubal ligation  05/31/1985  . Carpal tunnel release Left   . Hemiarthroplasty shoulder fracture Right 1980's  . Multiple tooth extractions  ~ 2011    tumors removed ; "my whole top"  . Cardiac catheterization  05/2011  . Tee without cardioversion N/A 01/14/2014    Procedure: TRANSESOPHAGEAL ECHOCARDIOGRAM (TEE);  Surgeon: Thurmon Fair, MD;  Location: [facility] ENDOSCOPY;  Service: Cardiovascular;  Laterality: N/A;  . Breast surgery Bilateral 2011    patient reports benign results  . Bi-ventricular implantable cardioverter defibrillator  (crt-d)  08/2013    [name] 08/23/2013   Past Medical History  Diagnosis Date  . Hypertension  Poorly controlled. Has had HTN since age 27. Angioedema with ACEI.  24 Hr urine and renal arterial dopplers ordered . . . Never done  . NICM (nonischemic cardiomyopathy)     EF 45-50% in 8/12, cath 6/12 showed normal coronaries, EF 50-55% by LV gram  . Morbid obesity   . Allergic rhinitis   . HLD (hyperlipidemia)   . OSA on CPAP     sleep study in 8/12 showed moderate to severe OSA requiring CPAP  . Chronic lower back pain     secondary to DJD, obsetiy, hip problems. Followed by Dr. Ivory Broad (pain management)  . Chronic diastolic heart failure      Primarily diastolic CHF: Likely due to uncontrolled HTN. Last echo (8/12) with EF 45-50%, mild to moderate LVH with some asymmetric septal hypertrophy, RV normal  size and systolic function. EF 50-55% by LV-gram in 6/12.   . Polyneuropathy in diabetes(357.2)   . Thoracic or lumbosacral neuritis or radiculitis, unspecified   . Calcifying tendinitis of shoulder   . LBBB (left bundle branch block)   . Chronic combined systolic and diastolic CHF (congestive heart failure)     EF 40-45% by echo 12/06/2012  . Coronary artery disease     questionable. LHC 05/2011 showing normal coronaries // Followed at [facility], Dr. Shirlee Latch  . GERD (gastroesophageal reflux disease)   . Shortness of breath   . Asthma   . Automatic implantable cardioverter-defibrillator in situ   . Stroke 12/2013    "my left side is paralyzed" (07/04/2014)  . Type II diabetes mellitus DX: 2002  . 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 Degeneration of lumbar or lumbosacral intervertebral disc   . Arthritis     "hips, back, legs, arms" (07/04/2014)  . Frequent UTI    BP 161/98  Pulse 98  Resp 14  SpO2 99%  Opioid Risk Score:   Fall Risk Score:    Review of Systems  Gastrointestinal: Positive for constipation.  Genitourinary:       Bladder control problems  Musculoskeletal: Positive for gait problem.  Neurological: Positive for weakness and numbness.  All other systems reviewed and are negative.      Objective:   Physical Exam   Physical Exam  Constitutional: She is oriented to person, place, and time. She appears well-developed and well-nourished.  Obese --no change  HENT:  Head: Normocephalic and atraumatic.  Nose: Nose normal.  Eyes: Conjunctivae and EOM are normal. Pupils are equal, round, and reactive to light.  Neck: Normal range of motion.  Cardiovascular: Normal rate and regular rhythm. Defib/pacer left upper chest wall, site clean  Pulmonary/Chest: Effort normal and breath sounds normal.  Abdominal: She exhibits no distension. There is no tenderness.  Musculoskeletal: Patient had pain with palpation over the righ tlower lumbar  paraspinals and upper pelvis. Hip was non-tender. Transferring was improved.  Trace to 1+ edema left lower ext. 1+ LUE. Ambulated for me and still hyperextends the left knee in weight bearing and circumducts the left leg to help with clearance.  Mild joint line tenderness bilaterally. She has bilateral valgus deformities at each knee, right more than left.   Neurological: She is alert and oriented to person, place, and time. Left upper trace at bicep and deltoid. 0/5 elsewhere in UE. Trace movement proximal LLE. Tone 3/4 bicep trace  at pec and 1-2/4 wrist/fingers, 1+ gastroc, TP, tib anterior trace. DTR's 3+ on left. Left central 7 and  tongue deviation. Speech minimally dysarthric. Intelligence normal. sensory deficit (stocking glove both legs) is present.  Psychiatric: She has a normal mood and affect. Her behavior is normal. Judgment and thought content normal.   Assessment & Plan:   ASSESSMENT:  1. Chronic low back pain which is multifactorial. Recent MRI of the  back is notable for disc disease and facet hypertrophy. Most  involved level appeared to be L4-L5, where there is a shallow broad-  based disk herniation and facet arthropathy with mild stenosis.  2. Diabetic peripheral neuropathy.  3. Bilateral rotator cuff syndrome.  4. 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  5. Cardiomyopathy/HTN. It is quite obvious that she's feeling better given her weight loss and pacer.  6. 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  7. Morbid obesity     PLAN:  1. Given her poor tolerance of medications for neuro pain and spasticity, we will proceed with botox to the LUE  2. Percocet 10/325 was refilled one q8 prn. #90 for 08/13/14.  3. Continue stretching and splint wear at home.   4. Will not resume baclofen at this time, but may need to start it back at low dose given her diffuse tone. 5. Cardiac mgt per Dr. Jones Broom  6. Continue Voltaren gel  for knees/ hands, this would be quite helpful. .  7. Further AFO adjustment per [facility]. May benefit from Kick plate still for left shoe.   8. I will see her back in about a month for said injections. 30 minutes of face to face patient care time were spent during this visit. All questions were encouraged and answered.

## 2014-10-01 ENCOUNTER — Ambulatory Visit (INDEPENDENT_AMBULATORY_CARE_PROVIDER_SITE_OTHER): Payer: Medicaid Other | Admitting: Internal Medicine

## 2014-10-01 ENCOUNTER — Encounter: Payer: Self-pay | Admitting: Internal Medicine

## 2014-10-01 VITALS — BP 147/99 | HR 88 | Temp 97.8°F | Ht 69.0 in | Wt 266.8 lb

## 2014-10-01 DIAGNOSIS — N3946 Mixed incontinence: Secondary | ICD-10-CM

## 2014-10-01 DIAGNOSIS — E559 Vitamin D deficiency, unspecified: Secondary | ICD-10-CM | POA: Insufficient documentation

## 2014-10-01 DIAGNOSIS — Z23 Encounter for immunization: Secondary | ICD-10-CM

## 2014-10-01 DIAGNOSIS — E1165 Type 2 diabetes mellitus with hyperglycemia: Secondary | ICD-10-CM

## 2014-10-01 DIAGNOSIS — Z0000 Encounter for general adult medical examination without abnormal findings: Secondary | ICD-10-CM

## 2014-10-01 DIAGNOSIS — I10 Essential (primary) hypertension: Secondary | ICD-10-CM

## 2014-10-01 DIAGNOSIS — IMO0002 Reserved for concepts with insufficient information to code with codable children: Secondary | ICD-10-CM

## 2014-10-01 DIAGNOSIS — G8114 Spastic hemiplegia affecting left nondominant side: Secondary | ICD-10-CM

## 2014-10-01 DIAGNOSIS — E1140 Type 2 diabetes mellitus with diabetic neuropathy, unspecified: Secondary | ICD-10-CM

## 2014-10-01 DIAGNOSIS — G8110 Spastic hemiplegia affecting unspecified side: Secondary | ICD-10-CM

## 2014-10-01 LAB — POCT GLYCOSYLATED HEMOGLOBIN (HGB A1C): HEMOGLOBIN A1C: 7.4

## 2014-10-01 LAB — HM DIABETES EYE EXAM

## 2014-10-01 LAB — GLUCOSE, CAPILLARY: Glucose-Capillary: 192 mg/dL — ABNORMAL HIGH (ref 70–99)

## 2014-10-01 MED ORDER — INSULIN GLARGINE 100 UNIT/ML SOLOSTAR PEN: PEN_INJECTOR | SUBCUTANEOUS | Status: DC

## 2014-10-01 NOTE — Assessment & Plan Note (Signed)
25-Hydroxy Vit D checked in 07/2014 shown to be 20. Had been taking Ergocalciferol, however, has not taken this in some time, according to the patient. -Will recheck 1, 25 Vit D at next visit and treat accordingly.

## 2014-10-01 NOTE — Assessment & Plan Note (Signed)
Lab Results  Component Value Date   HGBA1C 7.4 10/01/2014   HGBA1C 8.3 07/03/2014   HGBA1C 12.1 03/25/2014     Assessment: Diabetes control: good control (HgbA1C at goal) Progress toward A1C goal:  improved Comments: Significant improvement in HbA1c, decreased from 8.3 in 06/2014, and 12.1 in 03/2014. Very compliant w/ Lantus 10 units in AM, 35 units in PM and adheres to a healthy diet, according to the patient. Review of CBG at home show average blood glucose of 168.   Plan: Medications:  continue current medications; Lantus 10/25 as above + Metformin 1000 mg bid Home glucose monitoring: Frequency: 3 times a day Timing: before breakfast;before lunch;at bedtime Instruction/counseling given: reminded to bring medications to each visit Educational resources provided: brochure Self management tools provided: copy of home glucose meter download Other plans: RTC in 3 months for HbA1c recheck.  For retinal camera examination today.

## 2014-10-01 NOTE — Assessment & Plan Note (Addendum)
-  Flu shot given today -Plan for pap smear at next visit

## 2014-10-01 NOTE — Progress Notes (Signed)
Case discussed with Dr. Jones at time of visit.  We reviewed the resident's history and exam and pertinent patient test results.  I agree with the assessment, diagnosis, and plan of care documented in the resident's note. 

## 2014-10-01 NOTE — Assessment & Plan Note (Signed)
Followed closely by Dr. Riley Kill, seen on 09/30/14. Patient for LUE botox injection at next visit.

## 2014-10-01 NOTE — Assessment & Plan Note (Signed)
BP Readings from Last 3 Encounters:  10/01/14 147/99  09/30/14 161/98  09/11/14 142/90    Lab Results  Component Value Date   NA 136* 08/20/2014   K 3.8 08/20/2014   CREATININE 0.56 08/20/2014    Assessment: Blood pressure control: mildly elevated Progress toward BP goal:  improved Comments: BP better controlled today, says she has been much better at home as well.   Plan: Medications:  continue current medications; Toprol-XL 50 mg bid, Imdur 60 mg qd, Hydralazine 100 mg tid, Norvasc 5 mg qd, and Lasix 40 mg prn for weight gain.  Educational resources provided: brochure Self management tools provided: home blood pressure logbook Other plans: RTC in 3 months. Patient also to follow up w/ cardiology in the next 1-2 weeks.

## 2014-10-01 NOTE — Patient Instructions (Addendum)
General Instructions:  1. Please schedule a follow up appointment for 3 months.  2. Please take all medications as prescribed.   3. If you have worsening of your symptoms or new symptoms arise, please call the clinic ([phone]), or go to the ER immediately if symptoms are severe.    Please bring your medicines with you each time you come to clinic.  Medicines may include prescription medications, over-the-counter medications, herbal remedies, eye drops, vitamins, or other pills.   Progress Toward Treatment Goals:  Treatment Goal 10/01/2014  Hemoglobin A1C improved  Blood pressure improved  Prevent falls at goal    Self Care Goals & Plans:  Self Care Goal 10/01/2014  Manage my medications take my medicines as prescribed; bring my medications to every visit; refill my medications on time  Monitor my health keep track of my blood glucose; bring my glucose meter and log to each visit  Eat healthy foods drink diet soda or water instead of juice or soda; eat more vegetables; eat foods that are low in salt; eat baked foods instead of fried foods; eat fruit for snacks and desserts  Be physically active -  Meeting treatment goals maintain the current self-care plan    Home Blood Glucose Monitoring 10/01/2014  Check my blood sugar 3 times a day  When to check my blood sugar before breakfast; before lunch; at bedtime     Care Management & Community Referrals:  Referral 10/01/2014  Referrals made for care management support none needed  Referrals made to community resources none

## 2014-10-01 NOTE — Assessment & Plan Note (Signed)
Still w/ some urge-type symptoms, improved since her last visit however. No recent dysuria abdominal pain, or flank pain.  -To follow up w/ urology in the next 1-2 weeks for further management of incontinence.

## 2014-10-01 NOTE — Progress Notes (Signed)
Subjective:   Patient ID: [name] female   DOB: [birth year] 48 y.o.   MRN: [id number]  HPI: [name] is a 48 y.o. female w/ PMHx of HTN, HLD, uncontrolled DM type II, OSA, CAD, CHF, and h/o CVA w/ left-sided residual weakness, presents to the clinic today for follow up. Patient has recently followed up w/ PM&R on 10/19 and is scheduled to have a RUE botox injection for UE spasticity as she has been unable to tolerate muscle relaxants in the recent past 2/2 polypharmacy and resultant hypotension. She was also seen at [facility] for follow up on 09/12/14.  Today the patient has no complaints. She says she has been feeling very well recently and says her DM has been well controlled at home, as well as her BP. Per review of her meter, average CBG's have been 160. She claims she has not had a blood sugar over 200 in several weeks. She also says her urinary symptoms (mixed incontinence) have been okay recently, no rentention or dysuria. She continues to have some urge symptoms but is scheduled to follow up w/ urology w/in the next week for further managment of her incontinence.  She is also scheduled to follow up w/ Dr. Gala Romney this week.  She denies any other complaints.   Past Medical History  Diagnosis Date  . Hypertension     Poorly controlled. Has had HTN since age 1. Angioedema with ACEI.  24 Hr urine and renal arterial dopplers ordered . . . Never done  . NICM (nonischemic cardiomyopathy)     EF 45-50% in 8/12, cath 6/12 showed normal coronaries, EF 50-55% by LV gram  . Morbid obesity   . Allergic rhinitis   . HLD (hyperlipidemia)   . OSA on CPAP     sleep study in 8/12 showed moderate to severe OSA requiring CPAP  . Chronic lower back pain     secondary to DJD, obsetiy, hip problems. Followed by Dr. Ivory Broad (pain management)  . Chronic diastolic heart failure      Primarily diastolic CHF: Likely due to uncontrolled HTN. Last echo (8/12) with EF 45-50%,  mild to moderate LVH with some asymmetric septal hypertrophy, RV normal size and systolic function. EF 50-55% by LV-gram in 6/12.   . Polyneuropathy in diabetes(357.2)   . Thoracic or lumbosacral neuritis or radiculitis, unspecified   . Calcifying tendinitis of shoulder   . LBBB (left bundle branch block)   . Chronic combined systolic and diastolic CHF (congestive heart failure)     EF 40-45% by echo 12/06/2012  . Coronary artery disease     questionable. LHC 05/2011 showing normal coronaries // Followed at [facility], Dr. Shirlee Latch  . GERD (gastroesophageal reflux disease)   . Shortness of breath   . Asthma   . Automatic implantable cardioverter-defibrillator in situ   . Stroke 12/2013    "my left side is paralyzed" (07/04/2014)  . Type II diabetes mellitus DX: 2002  . 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 Degeneration of lumbar or lumbosacral intervertebral disc   . Arthritis     "hips, back, legs, arms" (07/04/2014)  . Frequent UTI    Current Outpatient Prescriptions  Medication Sig Dispense Refill  . amLODipine (NORVASC) 5 MG tablet Take 1 tablet (5 mg total) by mouth daily.  30 tablet  3  . aspirin 325 MG tablet Take 1 tablet (325 mg total) by mouth daily.  30 tablet  5  . citalopram (CELEXA) 20 MG tablet Take 1 tablet (20 mg total) by mouth daily.  30 tablet  5  . cyclobenzaprine (FLEXERIL) 10 MG tablet Take 10 mg by mouth 3 (three) times daily as needed for muscle spasms.      . diclofenac sodium (VOLTAREN) 1 % GEL Apply 2 g topically 4 (four) times daily as needed (pain).  3 Tube  5  . digoxin (LANOXIN) 0.125 MG tablet Take 0.125 mg by mouth daily.      . fluticasone (CUTIVATE) 0.05 % cream Apply topically 2 (two) times daily. Dispense two tubes  30 g  6  . furosemide (LASIX) 40 MG tablet Take 1 tablet (40 mg total) by mouth daily as needed.      [name] glucose blood (ACCU-CHEK SMARTVIEW) test strip Use to Check Blood Sugar TID. Code: E11.40.  100 each  12    . hydrALAZINE (APRESOLINE) 100 MG tablet Take 1 tablet (100 mg total) by mouth 3 (three) times daily.  90 tablet  3  . Incontinence Supplies MISC 1 year supply of Pull-ups in size XL Diagnosis Code: 788.33  1 each  0  . insulin glargine (LANTUS) 100 UNIT/ML injection Inject 10-35 Units into the skin 2 (two) times daily. 10 units in am, 35 units in pm      . Insulin Pen Needle (GLOBAL EASE INJECT PEN NEEDLES) 31G X 5 MM MISC Use to inject insulin BID. Code: E 11.40  100 each  10  . isosorbide mononitrate (IMDUR) 60 MG 24 hr tablet Take 1 tablet (60 mg total) by mouth every morning.  30 tablet  11  . metFORMIN (GLUCOPHAGE) 1000 MG tablet Take 1,000 mg by mouth 2 (two) times daily with a meal.      . metoprolol succinate (TOPROL-XL) 25 MG 24 hr tablet Take 50 mg (2 tablets) twice daily.  180 tablet  3  . oxyCODONE-acetaminophen (PERCOCET) 10-325 MG per tablet Take 1 tablet by mouth every 8 (eight) hours as needed for pain.  90 tablet  0  . pantoprazole (PROTONIX) 40 MG tablet Take 40 mg by mouth daily.      . potassium chloride 20 MEQ TBCR Take 20 mEq by mouth daily.  90 tablet  3  . simvastatin (ZOCOR) 40 MG tablet Take 40 mg by mouth at bedtime.       . tamsulosin (FLOMAX) 0.4 MG CAPS capsule Take 1 capsule (0.4 mg total) by mouth daily.  30 capsule  1  . Vitamin D, Ergocalciferol, (DRISDOL) 50000 UNITS CAPS capsule Take 1 capsule (50,000 Units total) by mouth every 7 (seven) days.  24 capsule  0   No current facility-administered medications for this visit.    Review of Systems: General: Denies fever, chills, diaphoresis, appetite change and fatigue.  Respiratory: Denies SOB, DOE, cough, chest tightness, and wheezing.  Cardiovascular: Denies chest pain and palpitations.  Gastrointestinal: Denies nausea, vomiting, abdominal pain, diarrhea, constipation, blood in stool and abdominal distention.  Genitourinary: Denies dysuria, frequency, hematuria, and flank pain.  Endocrine: Denies hot or  cold intolerance, polyuria, and polydipsia.  Musculoskeletal: Positive for gait problem. Denies myalgias, back pain, joint swelling, arthralgias..  Skin: Denies pallor, rash and wounds.  Neurological: Positive for left-sided residual weakness, LUE weakness>LLE weakness. Denies dizziness, seizures, syncope, weakness, lightheadedness, numbness and headaches.  Psychiatric/Behavioral: Denies mood changes, confusion, nervousness, sleep disturbance and agitation.  Objective:   Physical Exam: Filed Vitals:   10/01/14 1022  BP: 147/99  Pulse: 88  Temp: 97.8 F (36.6 C)  TempSrc: Oral  Height: 5\' 9"  (1.753 m)  Weight: 266 lb 12.8 oz (121.02 kg)  SpO2: 100%    General: Alert, cooperative, NAD.  HEENT: PERRL, EOMI. Moist mucus membranes. Left-sided facial droop.  Neck: Full range of motion without pain, supple, no lymphadenopathy or carotid bruits Lungs: Clear to ascultation bilaterally, normal work of respiration, no wheezes, rales, rhonchi Heart: Tachycardia, regular rhythm, no murmurs, gallops, or rubs Abdomen: Soft, non-tender, non-distended, BS + Extremities: No cyanosis, clubbing, or edema. Left upper and lower extremity weakness.  Neurologic: Alert & oriented X3, cranial nerves II-XII intact, 1/5 strength in LUE, 4/5 in LLE. Otherwise strength intact. Sensory abnormalities noted over the LUE.   Assessment & Plan:   Please see problem based assessment and plan.

## 2014-10-07 ENCOUNTER — Ambulatory Visit (HOSPITAL_COMMUNITY)
Admission: RE | Admit: 2014-10-07 | Discharge: 2014-10-07 | Disposition: A | Discharge status: Home / Self Care | Payer: Medicaid Other | Source: Ambulatory Visit | Attending: Cardiology | Admitting: Cardiology

## 2014-10-07 ENCOUNTER — Encounter: Payer: Medicaid Other | Admitting: *Deleted

## 2014-10-07 ENCOUNTER — Encounter (HOSPITAL_COMMUNITY): Payer: Self-pay

## 2014-10-07 VITALS — BP 151/98 | HR 98 | Resp 20 | Wt 267.0 lb

## 2014-10-07 DIAGNOSIS — I10 Essential (primary) hypertension: Secondary | ICD-10-CM | POA: Insufficient documentation

## 2014-10-07 DIAGNOSIS — E1140 Type 2 diabetes mellitus with diabetic neuropathy, unspecified: Secondary | ICD-10-CM

## 2014-10-07 DIAGNOSIS — Z95810 Presence of automatic (implantable) cardiac defibrillator: Secondary | ICD-10-CM | POA: Insufficient documentation

## 2014-10-07 DIAGNOSIS — I2510 Atherosclerotic heart disease of native coronary artery without angina pectoris: Secondary | ICD-10-CM | POA: Diagnosis not present

## 2014-10-07 DIAGNOSIS — G4733 Obstructive sleep apnea (adult) (pediatric): Secondary | ICD-10-CM | POA: Diagnosis not present

## 2014-10-07 DIAGNOSIS — I5042 Chronic combined systolic (congestive) and diastolic (congestive) heart failure: Secondary | ICD-10-CM | POA: Insufficient documentation

## 2014-10-07 DIAGNOSIS — E785 Hyperlipidemia, unspecified: Secondary | ICD-10-CM | POA: Diagnosis not present

## 2014-10-07 DIAGNOSIS — I447 Left bundle-branch block, unspecified: Secondary | ICD-10-CM | POA: Diagnosis not present

## 2014-10-07 DIAGNOSIS — E119 Type 2 diabetes mellitus without complications: Secondary | ICD-10-CM | POA: Insufficient documentation

## 2014-10-07 DIAGNOSIS — I429 Cardiomyopathy, unspecified: Secondary | ICD-10-CM | POA: Diagnosis not present

## 2014-10-07 DIAGNOSIS — IMO0002 Reserved for concepts with insufficient information to code with codable children: Secondary | ICD-10-CM

## 2014-10-07 DIAGNOSIS — E1165 Type 2 diabetes mellitus with hyperglycemia: Secondary | ICD-10-CM

## 2014-10-07 DIAGNOSIS — Z79899 Other long term (current) drug therapy: Secondary | ICD-10-CM | POA: Insufficient documentation

## 2014-10-07 DIAGNOSIS — Z7982 Long term (current) use of aspirin: Secondary | ICD-10-CM | POA: Diagnosis not present

## 2014-10-07 DIAGNOSIS — E669 Obesity, unspecified: Secondary | ICD-10-CM

## 2014-10-07 MED ORDER — HYDRALAZINE HCL 100 MG PO TABS
50.0000 mg | ORAL_TABLET | Freq: Three times a day (TID) | ORAL | Status: DC
Start: 2014-10-07 — End: 2015-02-17

## 2014-10-07 NOTE — Progress Notes (Signed)
Patient ID: [name], female   DOB: Apr 13, 1966, 48 y.o.   MRN: [id number]   Weight Range  282 pounds  Baseline proBNP   EP: Dr Graciela Husbands PCP: Dr Yetta Barre or Dr. Lovenia Kim internal medicine  Angioedema with ACE-I  --Optivol--  HPI: 48 yo with history of morbid obesity, HTN, HLD, CAD, DM2, OSA on CPAP, CVA and combined CHF in the setting of poorly-controlled HTN. She was admitted in 05/2011 with chest pain and new LBBB and ended up getting a left heart cath, which showed no angiographic CAD. Renal artery dopplers were normal in 04/2012.   Was hospitalized May 2014 with acute on chronic combined systolic and diastolic CHF. EF from December 2013 was down to 40 to 45%.   Admitted in January 2015 with slurred speech, L arm weakness and facial droop. Initial head CT showed no acute abnormality. Repeat head CT showed subtle gray white matter differentiation in R frontal lobe and R operculum perhaps representing ischemic R sided MCA. STAT repeat CT head later on 1/29 showed further evolution of R MCA territory infarct involving R insular cortex and operculum. No hemorrhage. Neurology recommended CTA head/neck, which showed occlusion of MCA branch to the area of R MCA infarct and a normal neck CTA. TEE was done and did not reveal embolic source. Was discharged to inpatient rehab.   Follow up for HF: Evaluated by Dr Parthenia Ames in September and metoprolol was increased to 50 mg twice a day and she resumed amlodipine 5 mg daily. Has a nurse aide daily. Ambulated in clinic with cane and an aide. Denies SOB/PND/Orhtopnea. Sleeps on with elevated for comfort. Weight at home 260-263 pounds. Has not had lasix in over a month. Taking all medications except  hydralazine and metformin.  BP 148/83 at home. Requires assistance with ADLs. Daughter lives with her.    ECHO 11/2012: 40-45% ECHO  07/2013: 20% RV normal ECHO    08/23/2013: EF 40-45%, diff HK ECHO   09/05/2013: EF 25-30%, diff HK  ECHO  12/11/13: EF 30%, diff HK,  RV normal ECHO 01/14/2014: EF 35-40%, LA mod dilated   Labs 06/2013: proBNP 230, Cr 0.9, K 3.7          08/2013: K+ 3.9, Cr 0.9       09/2013: K 4.2, Cr 0.86, pro-BNP 718, dig level <0.3     10/07/13: K+ 4.1, Cr 0.83       05/08/14: K 4.0 Creatinine 0.67 Pro BNP 233 Dig level 0.3        08/20/2014 K 3.8 nCreatinine 0.56   ROS: All systems negative except as listed in HPI, PMH and Problem List.  Past Medical History  Diagnosis Date  . Hypertension     Poorly controlled. Has had HTN since age 23. Angioedema with ACEI.  24 Hr urine and renal arterial dopplers ordered . . . Never done  . NICM (nonischemic cardiomyopathy)     EF 45-50% in 8/12, cath 6/12 showed normal coronaries, EF 50-55% by LV gram  . Morbid obesity   . Allergic rhinitis   . HLD (hyperlipidemia)   . OSA on CPAP     sleep study in 8/12 showed moderate to severe OSA requiring CPAP  . Chronic lower back pain     secondary to DJD, obsetiy, hip problems. Followed by Dr. Ivory Broad (pain management)  . Chronic diastolic heart failure      Primarily diastolic CHF: Likely due to uncontrolled HTN. Last echo (8/12) with EF 45-50%,  mild to moderate LVH with some asymmetric septal hypertrophy, RV normal size and systolic function. EF 50-55% by LV-gram in 6/12.   . Polyneuropathy in diabetes(357.2)   . Thoracic or lumbosacral neuritis or radiculitis, unspecified   . Calcifying tendinitis of shoulder   . LBBB (left bundle branch block)   . Chronic combined systolic and diastolic CHF (congestive heart failure)     EF 40-45% by echo 12/06/2012  . Coronary artery disease     questionable. LHC 05/2011 showing normal coronaries // Followed at [facility], Dr. Shirlee Latch  . GERD (gastroesophageal reflux disease)   . Shortness of breath   . Asthma   . Automatic implantable cardioverter-defibrillator in situ   . Stroke 12/2013    "my left side is paralyzed" (07/04/2014)  . Type II diabetes mellitus DX: 2002  . DJD (degenerative joint  disease) of hip     right sided  . Degeneration of lumbar or lumbosacral intervertebral disc   . Arthritis     "hips, back, legs, arms" (07/04/2014)  . Frequent UTI     Current Outpatient Prescriptions  Medication Sig Dispense Refill  . amLODipine (NORVASC) 5 MG tablet Take 1 tablet (5 mg total) by mouth daily.  30 tablet  3  . aspirin 325 MG tablet Take 1 tablet (325 mg total) by mouth daily.  30 tablet  5  . citalopram (CELEXA) 20 MG tablet Take 1 tablet (20 mg total) by mouth daily.  30 tablet  5  . diclofenac sodium (VOLTAREN) 1 % GEL Apply 2 g topically 4 (four) times daily as needed (pain).  3 Tube  5  . digoxin (LANOXIN) 0.125 MG tablet Take 0.125 mg by mouth daily.      . fluticasone (CUTIVATE) 0.05 % cream Apply topically 2 (two) times daily. Dispense two tubes  30 g  6  . furosemide (LASIX) 40 MG tablet Take 1 tablet (40 mg total) by mouth daily as needed.      [name] glucose blood (ACCU-CHEK SMARTVIEW) test strip Use to Check Blood Sugar TID. Code: E11.40.  100 each  12  . hydrALAZINE (APRESOLINE) 100 MG tablet Take 1 tablet (100 mg total) by mouth 3 (three) times daily.  90 tablet  3  . Incontinence Supplies MISC 1 year supply of Pull-ups in size XL Diagnosis Code: 788.33  1 each  0  . Insulin Glargine (LANTUS SOLOSTAR) 100 UNIT/ML Solostar Pen Inject 10 units in the AM, 35 units in the PM  15 mL  11  . Insulin Pen Needle (GLOBAL EASE INJECT PEN NEEDLES) 31G X 5 MM MISC Use to inject insulin BID. Code: E 11.40  100 each  10  . isosorbide mononitrate (IMDUR) 60 MG 24 hr tablet Take 1 tablet (60 mg total) by mouth every morning.  30 tablet  11  . metFORMIN (GLUCOPHAGE) 1000 MG tablet Take 1,000 mg by mouth 2 (two) times daily with a meal.      . metoprolol succinate (TOPROL-XL) 25 MG 24 hr tablet Take 50 mg (2 tablets) twice daily.  180 tablet  3  . oxyCODONE-acetaminophen (PERCOCET) 10-325 MG per tablet Take 1 tablet by mouth every 8 (eight) hours as needed for pain.  90 tablet  0  .  pantoprazole (PROTONIX) 40 MG tablet Take 40 mg by mouth daily.      . potassium chloride 20 MEQ TBCR Take 20 mEq by mouth daily.  90 tablet  3  . simvastatin (ZOCOR) 40 MG tablet Take  40 mg by mouth at bedtime.       . Vitamin D, Ergocalciferol, (DRISDOL) 50000 UNITS CAPS capsule Take 1 capsule (50,000 Units total) by mouth every 7 (seven) days.  24 capsule  0  . cyclobenzaprine (FLEXERIL) 10 MG tablet Take 10 mg by mouth 3 (three) times daily as needed for muscle spasms.       No current facility-administered medications for this encounter.   Filed Vitals:   10/07/14 1027  BP: 151/98  Pulse: 98  Resp: 20  Weight: 267 lb (121.11 kg)  SpO2: 100%   PHYSICAL EXAM: General:  NAD. No resp difficulty; son and grand-daughters present.  HEENT: normal Neck: supple. Thick. Hard to see JVP, but appears mildly elevated. Carotids 2+ bilaterally; no bruits. No lymphadenopathy appreciated.  Cor: PMI nonpalpable; Regular rate & rhythm. No rubs, gallops or murmurs. Split s2 Lungs: clear Abdomen: obese soft, nontender, mildly distended. No hepatosplenomegaly. No bruits or masses. Good bowel sounds. Extremities: no cyanosis, clubbing, rash, trace edema LLE brace RUE 1 + edema Neuro: alert & orientedx3, cranial nerves grossly intact. Moves all 4 extremities w/o difficulty. Affect pleasant.   ASSESSMENT:  1. Chronic systolic HF, NICM likely r/t HTN, EF 35-40% (01/2014) s/p CRT-D-[employer]tr[employer] - NYHA II symptoms and volume status stable. Continue lasix as needed.  - Optivol Interrogated:  Optivol - Fluid level well below threshold. Activity < 1 hour. No Afib.  - Continue  potassium 20 meq daily as needed  - Will continue Toprol 50 mg twice a day.  - Has been off hydralalzine for over a month. She is agreeable to take 50 mg three times a day.  Continue Isordil 20 mg TID.  Continue digoxin, last level (04/2014 was <0.3) - No  ACE-I due to angioedmea.   - Reinforced the need and importance of daily weights,  a low sodium diet, and fluid restriction (less than 2 L a day). Instructed to call the HF clinic if weight increases more than 3 lbs overnight or 5 lbs in a week.  2. Uncontrolled HTN - Elevated but she stopped hydralazine on her own. I have asked her to take 50 mg hydralazine three times a day and see if she tolerates. Continue 5 mg amlodipine daily.  3. Morbid obesity - Encouraged weight loss with portion control. Asked patient to try and be more active.  4. OSA on CPAP -Uses intermittently. Encouraged to use nightly.  5. DM - on lantus but not taking metformin. PCP following.   Follow up in 3 months  Kendel Bessey NP-C 10:38 AM

## 2014-10-07 NOTE — Patient Instructions (Signed)
Follow up in 2 months  Take hydralazine 50 mg three times a day  Do the following things EVERYDAY: 1) Weigh yourself in the morning before breakfast. Write it down and keep it in a log. 2) Take your medicines as prescribed 3) Eat low salt foods-Limit salt (sodium) to 2000 mg per day.  4) Stay as active as you can everyday 5) Limit all fluids for the day to less than 2 liters

## 2014-10-14 ENCOUNTER — Emergency Department (HOSPITAL_COMMUNITY): Payer: Medicaid Other

## 2014-10-14 ENCOUNTER — Inpatient Hospital Stay (HOSPITAL_COMMUNITY)
Admission: EM | Admit: 2014-10-14 | Discharge: 2014-10-16 | DRG: 057 | Disposition: A | Discharge status: Home / Self Care | Payer: Medicaid Other | Attending: Infectious Diseases | Admitting: Infectious Diseases

## 2014-10-14 ENCOUNTER — Encounter (HOSPITAL_COMMUNITY): Payer: Self-pay

## 2014-10-14 DIAGNOSIS — J45909 Unspecified asthma, uncomplicated: Secondary | ICD-10-CM | POA: Diagnosis present

## 2014-10-14 DIAGNOSIS — R55 Syncope and collapse: Secondary | ICD-10-CM

## 2014-10-14 DIAGNOSIS — E1142 Type 2 diabetes mellitus with diabetic polyneuropathy: Secondary | ICD-10-CM | POA: Diagnosis present

## 2014-10-14 DIAGNOSIS — I69398 Other sequelae of cerebral infarction: Principal | ICD-10-CM

## 2014-10-14 DIAGNOSIS — Z6838 Body mass index (BMI) 38.0-38.9, adult: Secondary | ICD-10-CM

## 2014-10-14 DIAGNOSIS — Z888 Allergy status to other drugs, medicaments and biological substances status: Secondary | ICD-10-CM

## 2014-10-14 DIAGNOSIS — I5042 Chronic combined systolic (congestive) and diastolic (congestive) heart failure: Secondary | ICD-10-CM | POA: Diagnosis present

## 2014-10-14 DIAGNOSIS — F05 Delirium due to known physiological condition: Secondary | ICD-10-CM | POA: Diagnosis present

## 2014-10-14 DIAGNOSIS — Z881 Allergy status to other antibiotic agents status: Secondary | ICD-10-CM

## 2014-10-14 DIAGNOSIS — E1165 Type 2 diabetes mellitus with hyperglycemia: Secondary | ICD-10-CM

## 2014-10-14 DIAGNOSIS — G40909 Epilepsy, unspecified, not intractable, without status epilepticus: Secondary | ICD-10-CM

## 2014-10-14 DIAGNOSIS — R569 Unspecified convulsions: Secondary | ICD-10-CM

## 2014-10-14 DIAGNOSIS — I447 Left bundle-branch block, unspecified: Secondary | ICD-10-CM | POA: Diagnosis present

## 2014-10-14 DIAGNOSIS — R29810 Facial weakness: Secondary | ICD-10-CM | POA: Diagnosis present

## 2014-10-14 DIAGNOSIS — G4733 Obstructive sleep apnea (adult) (pediatric): Secondary | ICD-10-CM | POA: Diagnosis present

## 2014-10-14 DIAGNOSIS — K219 Gastro-esophageal reflux disease without esophagitis: Secondary | ICD-10-CM | POA: Diagnosis present

## 2014-10-14 DIAGNOSIS — I69354 Hemiplegia and hemiparesis following cerebral infarction affecting left non-dominant side: Secondary | ICD-10-CM | POA: Diagnosis not present

## 2014-10-14 DIAGNOSIS — Z87898 Personal history of other specified conditions: Secondary | ICD-10-CM

## 2014-10-14 DIAGNOSIS — R61 Generalized hyperhidrosis: Secondary | ICD-10-CM | POA: Diagnosis present

## 2014-10-14 DIAGNOSIS — M1610 Unilateral primary osteoarthritis, unspecified hip: Secondary | ICD-10-CM | POA: Diagnosis present

## 2014-10-14 DIAGNOSIS — E785 Hyperlipidemia, unspecified: Secondary | ICD-10-CM | POA: Diagnosis present

## 2014-10-14 DIAGNOSIS — I429 Cardiomyopathy, unspecified: Secondary | ICD-10-CM

## 2014-10-14 DIAGNOSIS — Z8673 Personal history of transient ischemic attack (TIA), and cerebral infarction without residual deficits: Secondary | ICD-10-CM

## 2014-10-14 DIAGNOSIS — I10 Essential (primary) hypertension: Secondary | ICD-10-CM | POA: Diagnosis present

## 2014-10-14 DIAGNOSIS — E1140 Type 2 diabetes mellitus with diabetic neuropathy, unspecified: Secondary | ICD-10-CM | POA: Diagnosis present

## 2014-10-14 DIAGNOSIS — Z950 Presence of cardiac pacemaker: Secondary | ICD-10-CM | POA: Diagnosis not present

## 2014-10-14 DIAGNOSIS — G40409 Other generalized epilepsy and epileptic syndromes, not intractable, without status epilepticus: Secondary | ICD-10-CM | POA: Diagnosis present

## 2014-10-14 DIAGNOSIS — I2510 Atherosclerotic heart disease of native coronary artery without angina pectoris: Secondary | ICD-10-CM | POA: Diagnosis present

## 2014-10-14 DIAGNOSIS — G9389 Other specified disorders of brain: Secondary | ICD-10-CM | POA: Diagnosis present

## 2014-10-14 DIAGNOSIS — I5032 Chronic diastolic (congestive) heart failure: Secondary | ICD-10-CM

## 2014-10-14 LAB — TROPONIN I

## 2014-10-14 LAB — COMPREHENSIVE METABOLIC PANEL
ALT: 10 U/L (ref 0–35)
AST: 13 U/L (ref 0–37)
Albumin: 4.1 g/dL (ref 3.5–5.2)
Alkaline Phosphatase: 75 U/L (ref 39–117)
Anion gap: 18 — ABNORMAL HIGH (ref 5–15)
BUN: 12 mg/dL (ref 6–23)
CALCIUM: 9.2 mg/dL (ref 8.4–10.5)
CHLORIDE: 97 meq/L (ref 96–112)
CO2: 23 mEq/L (ref 19–32)
CREATININE: 0.77 mg/dL (ref 0.50–1.10)
GFR calc non Af Amer: >90 mL/min (ref >90)
GLUCOSE: 242 mg/dL — AB (ref 70–99)
Potassium: 3.6 mEq/L — ABNORMAL LOW (ref 3.7–5.3)
Sodium: 138 mEq/L (ref 137–147)
Total Bilirubin: 0.6 mg/dL (ref 0.3–1.2)
Total Protein: 8 g/dL (ref 6.0–8.3)

## 2014-10-14 LAB — CBC
HEMATOCRIT: 40 % (ref 36.0–46.0)
HEMOGLOBIN: 13.1 g/dL (ref 12.0–15.0)
MCH: 27.6 pg (ref 26.0–34.0)
MCHC: 32.8 g/dL (ref 30.0–36.0)
MCV: 84.2 fL (ref 78.0–100.0)
Platelets: 251 10*3/uL (ref 150–400)
RBC: 4.75 MIL/uL (ref 3.87–5.11)
RDW: 12.7 % (ref 11.5–15.5)
WBC: 15.2 10*3/uL — AB (ref 4.0–10.5)

## 2014-10-14 LAB — MAGNESIUM: MAGNESIUM: 1.7 mg/dL (ref 1.5–2.5)

## 2014-10-14 LAB — GLUCOSE, CAPILLARY
GLUCOSE-CAPILLARY: 184 mg/dL — AB (ref 70–99)
Glucose-Capillary: 203 mg/dL — ABNORMAL HIGH (ref 70–99)

## 2014-10-14 LAB — DIGOXIN LEVEL

## 2014-10-14 MED ORDER — LORAZEPAM 2 MG/ML IJ SOLN: 1.0000 mg | INTRAMUSCULAR | Status: DC | PRN

## 2014-10-14 MED ORDER — CETYLPYRIDINIUM CHLORIDE 0.05 % MT LIQD
7.0000 mL | Freq: Two times a day (BID) | OROMUCOSAL | Status: DC
  Administered 2014-10-14 – 2014-10-16 (×4): 7 mL via OROMUCOSAL

## 2014-10-14 MED ORDER — DEXTROSE 5 % IV SOLN
2.0000 g | Freq: Two times a day (BID) | INTRAVENOUS | Status: DC
  Filled 2014-10-14: qty 2

## 2014-10-14 MED ORDER — DIGOXIN 125 MCG PO TABS
0.1250 mg | ORAL_TABLET | Freq: Every day | ORAL | Status: DC
  Administered 2014-10-14 – 2014-10-16 (×3): 0.125 mg via ORAL
  Filled 2014-10-14 (×3): qty 1

## 2014-10-14 MED ORDER — LEVETIRACETAM IN NACL 1000 MG/100ML IV SOLN
1000.0000 mg | Freq: Once | INTRAVENOUS | Status: AC
  Administered 2014-10-14: 1000 mg via INTRAVENOUS
  Filled 2014-10-14: qty 100

## 2014-10-14 MED ORDER — INSULIN ASPART 100 UNIT/ML ~~LOC~~ SOLN
0.0000 [IU] | Freq: Three times a day (TID) | SUBCUTANEOUS | Status: DC
Start: 2014-10-14 — End: 2014-10-16
  Administered 2014-10-14: 5 [IU] via SUBCUTANEOUS
  Administered 2014-10-15: 3 [IU] via SUBCUTANEOUS
  Administered 2014-10-15: 5 [IU] via SUBCUTANEOUS
  Administered 2014-10-15 – 2014-10-16 (×2): 3 [IU] via SUBCUTANEOUS
  Administered 2014-10-16: 5 [IU] via SUBCUTANEOUS

## 2014-10-14 MED ORDER — ASPIRIN 325 MG PO TABS
325.0000 mg | ORAL_TABLET | Freq: Every day | ORAL | Status: DC
  Administered 2014-10-14 – 2014-10-16 (×3): 325 mg via ORAL
  Filled 2014-10-14 (×3): qty 1

## 2014-10-14 MED ORDER — METOPROLOL SUCCINATE ER 50 MG PO TB24
50.0000 mg | ORAL_TABLET | Freq: Every day | ORAL | Status: DC
  Administered 2014-10-14 – 2014-10-16 (×3): 50 mg via ORAL
  Filled 2014-10-14 (×3): qty 1

## 2014-10-14 MED ORDER — LORAZEPAM 2 MG/ML IJ SOLN
0.5000 mg | Freq: Once | INTRAMUSCULAR | Status: AC
  Administered 2014-10-14: 0.5 mg via INTRAVENOUS

## 2014-10-14 MED ORDER — LEVETIRACETAM IN NACL 500 MG/100ML IV SOLN
500.0000 mg | Freq: Two times a day (BID) | INTRAVENOUS | Status: DC
  Administered 2014-10-15: 500 mg via INTRAVENOUS
  Filled 2014-10-14 (×2): qty 100

## 2014-10-14 MED ORDER — HYDRALAZINE HCL 20 MG/ML IJ SOLN: 10.0000 mg | Freq: Three times a day (TID) | INTRAMUSCULAR | Status: DC | PRN

## 2014-10-14 MED ORDER — LORAZEPAM 2 MG/ML IJ SOLN
INTRAMUSCULAR | Status: AC
  Filled 2014-10-14: qty 1

## 2014-10-14 MED ORDER — ENOXAPARIN SODIUM 40 MG/0.4ML ~~LOC~~ SOLN
40.0000 mg | SUBCUTANEOUS | Status: DC
  Administered 2014-10-14 – 2014-10-15 (×2): 40 mg via SUBCUTANEOUS
  Filled 2014-10-14 (×3): qty 0.4

## 2014-10-14 MED ORDER — AMLODIPINE BESYLATE 5 MG PO TABS
5.0000 mg | ORAL_TABLET | Freq: Every day | ORAL | Status: DC
  Administered 2014-10-14 – 2014-10-16 (×3): 5 mg via ORAL
  Filled 2014-10-14 (×3): qty 1

## 2014-10-14 NOTE — ED Notes (Signed)
Pt having witnessed seizure. ED resident at bedside. Pt placed on non-rebreather mask. O2 saturation of 100%.

## 2014-10-14 NOTE — H&P (Signed)
  Date: 10/14/2014  Patient name: [name]  Medical record number: [id number]  Date of birth: 1966-04-17   I have seen and evaluated [name] and discussed their care with the Residency Team.   Assessment and Plan: I have seen and evaluated the patient as outlined above. I agree with the formulated Assessment and Plan as detailed in the residents' admission note, with the following changes:   48 yo F with hx of CVA 12-2013 with residual L sided weakness. Pt was in her usual state this and was witnessed to have slumped over by her daughter. She had a roughly 3 minute shaking episode at home. Her daughter noted that she had soaked her clothes (incontence of urine?). She was evaluated by EMS and brought to [facility] ED. In ED she was felt to have seizure, was given ativan and then loaded with keppra.  During exam, pt is post-ativan, gives very little hx.   PMHx: DM2 (2002) HTN Hyperlipidemia CHF (non-ischemic cardiomyopathy), CAD Pacemaker  Sochx, FHx, ROS- pt does not cooperate with exam, somnolent.   Blood pressure 180/103, pulse 112, temperature 97.9 F (36.6 C), resp. rate 19, last menstrual period 09/26/2014, SpO2 99 %. General- she has soaked the sheets in the exam room, she interacts briefly, she has occasional/short (< 10 seconds) of single extremity jerking Eyes- no photophobia, EOMI Mouth- no thrush Neck- FROM to passive motion Chest- CTA CV- RRR Abd- BS+, soft, non-tender Extr- no edema  Labs of note: Cr 0.77  Glc 342 WBC 15.2   CT head: Chronic right MCA infarct with interval evolution of encephalomalacia. No acute intracranial abnormality.  A/P Seizures, new Would:   Continue keppra load  Broad anbx- ceftriaxone, vanco  Blood Cx, Ucx,  Strongly consider LP  Neuro to eval  No anticoagulation at this point   Check HIV   Ginnie Smart, MD 11/2/20154:25 PM

## 2014-10-14 NOTE — ED Provider Notes (Signed)
CSN: [id number]     Arrival date & time 10/14/14  1048 History   First MD Initiated Contact with Patient 10/14/14 1049     Chief Complaint  Patient presents with  . Loss of Consciousness     HPI Ms. 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 Per EMS, patient was at home with her home health nurse when the nurse saw the patient sitting up in bed and then suddenly fell back and was unresponsive. When EMS arrived, the patient became more alert but not at baseline. Patient is unable to give a good history at this time. She denies chest pain, SOB, palpitations, changes in vision, abdominal pain, nausea, vomiting, new weakness. She is unsure whether she hit her head.   Per patient's brother, patient took a bath this morning and got dressed with the help of the home health nurse, and then was sitting on the bed when she her body started to shake. Family states all of her limbs were shaking for about 3 minutes. Patient then was limp and was unresponsive. Family called EMS. Upon EMS arrival ten minutes later, patient was "very out of it" but started to at least respond to questions. Family denies history of seizures. Family also denies any recent medication changes. Her brother states she was in her normal state of health yesterday.     Past Medical History  Diagnosis Date  . Hypertension     Poorly controlled. Has had HTN since age 44. Angioedema with ACEI.  24 Hr urine and renal arterial dopplers ordered . . . Never done  . NICM (nonischemic cardiomyopathy)     EF 45-50% in 8/12, cath 6/12 showed normal coronaries, EF 50-55% by LV gram  . Morbid obesity   . Allergic rhinitis   . HLD (hyperlipidemia)   . OSA on CPAP     sleep study in 8/12 showed moderate to severe OSA requiring CPAP  . Chronic lower back pain     secondary to DJD, obsetiy,  hip problems. Followed by Dr. Ivory Broad (pain management)  . Chronic diastolic heart failure      Primarily diastolic CHF: Likely due to uncontrolled HTN. Last echo (8/12) with EF 45-50%, mild to moderate LVH with some asymmetric septal hypertrophy, RV normal size and systolic function. EF 50-55% by LV-gram in 6/12.   . Polyneuropathy in diabetes(357.2)   . Thoracic or lumbosacral neuritis or radiculitis, unspecified   . Calcifying tendinitis of shoulder   . LBBB (left bundle branch block)   . Chronic combined systolic and diastolic CHF (congestive heart failure)     EF 40-45% by echo 12/06/2012  . Coronary artery disease     questionable. LHC 05/2011 showing normal coronaries // Followed at [facility], Dr. Shirlee Latch  . GERD (gastroesophageal reflux disease)   . Shortness of breath   . Asthma   . Automatic implantable cardioverter-defibrillator in situ   . Stroke 12/2013    "my left side is paralyzed" (07/04/2014)  . Type II diabetes mellitus DX: 2002  . 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 Degeneration of lumbar or lumbosacral intervertebral disc   . Arthritis     "hips, back, legs, arms" (07/04/2014)  . Frequent UTI    Past Surgical History  Procedure Laterality Date  .  Tubal ligation  05/31/1985  . Carpal tunnel release Left   . Hemiarthroplasty shoulder fracture Right 1980's  . Multiple tooth extractions  ~ 2011    tumors removed ; "my whole top"  . Cardiac catheterization  05/2011  . Tee without cardioversion N/A 01/14/2014    Procedure: TRANSESOPHAGEAL ECHOCARDIOGRAM (TEE);  Surgeon: Thurmon Fair, MD;  Location: [facility] ENDOSCOPY;  Service: Cardiovascular;  Laterality: N/A;  . Breast surgery Bilateral 2011    patient reports benign results  . Bi-ventricular implantable cardioverter defibrillator  (crt-d)  08/2013    [name] 08/23/2013   Family History  Problem Relation Age of Onset  . Heart disease Mother 67    Died of MI at age 27 yo  . Heart  disease Paternal Grandmother     requiring pacemaker.  [name] Heart disease Paternal Grandfather 34    Died of MI at possibly age 88-53yo  . Stroke Paternal Grandfather   . Heart disease Father 28    MI age 43yo requiring stenting  . Kidney disease Mother     requiring dialysis  . Congestive Heart Failure Mother   . Diabetes Father   . Diabetes Brother   . Heart disease Brother 53    MI at age 2 years old  . Breast cancer Paternal Aunt   . Breast cancer Maternal Grandmother   . Glaucoma Father    History  Substance Use Topics  . Smoking status: Never Smoker   . Smokeless tobacco: Never Used  . Alcohol Use: No   OB History    No data available     Review of Systems General: Denies fever, chills, night sweats, changes in weight, changes in appetite HEENT: Denies headaches, ear pain, rhinorrhea, sore throat CV: Denies orthopnea Pulm: Denies cough, wheezing GI: Denies diarrhea, constipation, melena, hematochezia GU: Denies dysuria, hematuria, frequency Msk: Denies muscle cramps, joint pains Neuro: Denies numbness, tingling Skin: Denies rashes, bruising   Allergies  Clindamycin; Ace inhibitors; Enalapril maleate; and Lisinopril  Home Medications   Prior to Admission medications   Medication Sig Start Date End Date Taking? Authorizing Provider  amLODipine (NORVASC) 5 MG tablet Take 1 tablet (5 mg total) by mouth daily. 08/21/14  Yes [name], MD  aspirin 325 MG tablet Take 1 tablet (325 mg total) by mouth daily. 05/22/14  Yes Ranelle Oyster, MD  citalopram (CELEXA) 20 MG tablet Take 1 tablet (20 mg total) by mouth daily. 09/19/14 09/17/15 Yes Courtney Paris, MD  cyclobenzaprine (FLEXERIL) 10 MG tablet Take 10 mg by mouth daily.    Yes Historical Provider, MD  diclofenac sodium (VOLTAREN) 1 % GEL Apply 2 g topically 4 (four) times daily as needed (pain). 08/29/14  Yes Ranelle Oyster, MD  digoxin (LANOXIN) 0.125 MG tablet Take 0.125 mg by mouth daily.   Yes Historical  Provider, MD  fluticasone (CUTIVATE) 0.05 % cream Apply topically 2 (two) times daily. Dispense two tubes 07/11/14  Yes Jacalyn Lefevre, NP  furosemide (LASIX) 40 MG tablet Take 40 mg by mouth daily as needed for fluid.   Yes Historical Provider, MD  hydrALAZINE (APRESOLINE) 100 MG tablet Take 0.5 tablets (50 mg total) by mouth 3 (three) times daily. 10/07/14  Yes Amy D Clegg, NP  Insulin Glargine (LANTUS SOLOSTAR) 100 UNIT/ML Solostar Pen Inject 10 units in the AM, 35 units in the PM 10/01/14  Yes Courtney Paris, MD  isosorbide mononitrate (IMDUR) 60 MG 24 hr tablet Take 1 tablet (60 mg total) by  mouth every morning. 08/21/14 08/21/15 Yes Courtney Paris, MD  metFORMIN (GLUCOPHAGE) 1000 MG tablet Take 1,000 mg by mouth 2 (two) times daily with a meal. 04/12/14 04/12/15 Yes Courtney Paris, MD  metoprolol succinate (TOPROL-XL) 25 MG 24 hr tablet Take 50 mg (2 tablets) twice daily. 08/21/14  Yes [name], MD  oxyCODONE-acetaminophen (PERCOCET) 10-325 MG per tablet Take 1 tablet by mouth every 8 (eight) hours as needed for pain. 09/30/14  Yes Ranelle Oyster, MD  pantoprazole (PROTONIX) 40 MG tablet Take 40 mg by mouth daily.   Yes Historical Provider, MD  potassium chloride 20 MEQ TBCR Take 20 mEq by mouth daily. 08/07/14  Yes Aundria Rud, NP  simvastatin (ZOCOR) 40 MG tablet Take 40 mg by mouth at bedtime.    Yes Historical Provider, MD  furosemide (LASIX) 40 MG tablet Take 1 tablet (40 mg total) by mouth daily as needed. Patient not taking: Reported on 10/14/2014 08/21/14   Courtney Paris, MD  glucose blood (ACCU-CHEK SMARTVIEW) test strip Use to Check Blood Sugar TID. Code: E11.40. 09/30/14   Courtney Paris, MD  Incontinence Supplies MISC 1 year supply of Pull-ups in size XL Diagnosis Code: 788.33 08/27/14   Courtney Paris, MD  Insulin Pen Needle (GLOBAL EASE INJECT PEN NEEDLES) 31G X 5 MM MISC Use to inject insulin BID. Code: E 11.40 09/30/14   Courtney Paris, MD  Vitamin D, Ergocalciferol, (DRISDOL) 50000 UNITS CAPS  capsule Take 1 capsule (50,000 Units total) by mouth every 7 (seven) days. 09/02/14   Courtney Paris, MD   BP 129/72 mmHg  Pulse 100  Temp(Src) 97.9 F (36.6 C)  Resp 15  SpO2 97%  LMP 09/26/2014 (Approximate) Physical Exam General: sitting up in bed, drowsy HEENT: [place]/AT, EOMI, PERRL, sclera anicteric, mucus membranes moist Neck: supple, no JVD, no lymphadenopathy CV: tachycardic, no m/g/r Pulm: CTA bilaterally, breaths non-labored Abd: BS+, soft, obese, non-tender Ext: warm, no edema Neuro: alert and oriented to person and hospital, left-sided facial droop, strength 1/5 on left side, 4+/5 on right side  ED Course  Procedures (including critical care time) Labs Review Labs Reviewed  CBC - Abnormal; Notable for the following:    WBC 15.2 (*)    All other components within normal limits  COMPREHENSIVE METABOLIC PANEL - Abnormal; Notable for the following:    Potassium 3.6 (*)    Glucose, Bld 242 (*)    Anion gap 18 (*)    All other components within normal limits  DIGOXIN LEVEL - Abnormal; Notable for the following:    Digoxin Level <0.3 (*)    All other components within normal limits  TROPONIN I    Imaging Review Ct Head Wo Contrast  10/14/2014   CLINICAL DATA:  48 year old female with episode of unresponsiveness this morning, seizure-like activity. Hypertensive on presentation. Initial encounter. History of stroke with home health aide.  EXAM: CT HEAD WITHOUT CONTRAST  TECHNIQUE: Contiguous axial images were obtained from the base of the skull through the vertex without intravenous contrast.  COMPARISON:  Head CTs without contrast 01/11/2014 and earlier.  FINDINGS: Stable and negative paranasal sinuses and mastoids. No acute osseous abnormality identified. Visualized orbits and scalp soft tissues are within normal limits.  Sequelae all of right MCA infarct with expected evolution of encephalomalacia. No midline shift, mass effect, or evidence of intracranial mass lesion. No  ventriculomegaly. No acute intracranial hemorrhage identified. Stable gray-white matter differentiation elsewhere. No evidence of cortically based  acute infarction identified. No suspicious intracranial vascular hyperdensity.  IMPRESSION: Chronic right MCA infarct with interval evolution of encephalomalacia. No acute intracranial abnormality.   Electronically Signed   By: Augusto Gamble M.D.   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  CT Head was done which showed evidence of chronic right MCA infarct, but no acute intracranial pathology. EKG shows no evidence of ischemia, troponin negative. Pacemaker was interrogated and showed episode of VTach yesterday, but no arrhythmias today. Will admit to IMTS to work up first time seizure.     Rich Number, MD 10/14/14 1349

## 2014-10-14 NOTE — H&P (Signed)
Date: 10/14/2014               Patient Name:  [name] MRN: [id number]  DOB: 1966-02-02 Age / Sex: 48 y.o., female   PCP: Courtney Paris, MD         Medical Service: Internal Medicine Teaching Service         Attending Physician: Dr. Ginnie Smart, MD    First Contact: Dr. Tasia Catchings Pager: [phone]  Second Contact: Dr. Yetta Barre Pager: [phone]       After Hours (After 5p/  First Contact Pager: [phone]  weekends / holidays): Second Contact Pager: [phone]   Chief Complaint: presume syncopal episode  History of Present Illness:   48 yo female with hx of HTN, HLD, DM II, CAD, CHF s/p pacemaker, CVA 12/2013 with left sided residular weakness who presents after syncopal event this AM. Patient was with her home health nurse, nurse saw her sitting up in the bed and then suddenly fell and became unresponsive. Per patient's brother, she was shaking on her bed, all limbs were shaking per chart review for 3 mins. EMS was called, patient arose somewhat but remained drowsy in the ED.   Family denies any hx of seizures. No tongue biting or eye rolling per patient's brother. She didn't appear to be doing well for last few days al though patient denies any fever or recent illness.   During our interview, patient was diaphoretic, not communicating much.   CT head showed chronic right MCA infarct but no acute abnormality. WBC is 15.2.   She was given Ativan in the emergency room with no further seizure activity noted. She was subsequently given a loading dose of Keppra 1000 mg IV and started on maintenance dose of 500 mg of Keppra twice a day.   Meds: Current Facility-Administered Medications  Medication Dose Route Frequency Provider Last Rate Last Dose  . enoxaparin (LOVENOX) injection 40 mg  40 mg Subcutaneous Q24H Courtney Paris, MD      . insulin aspart (novoLOG) injection 0-15 Units  0-15 Units Subcutaneous TID WC Courtney Paris, MD      . LORazepam (ATIVAN) injection 1-2 mg  1-2 mg  Intravenous PRN Courtney Paris, MD        Allergies: Allergies as of 10/14/2014 - Review Complete 10/14/2014  Allergen Reaction Noted  . Clindamycin Anaphylaxis and Swelling   . Ace inhibitors Swelling 07/04/2014  . Enalapril maleate Swelling   . Lisinopril Swelling    Past Medical History  Diagnosis Date  . Hypertension     Poorly controlled. Has had HTN since age 39. Angioedema with ACEI.  24 Hr urine and renal arterial dopplers ordered . . . Never done  . NICM (nonischemic cardiomyopathy)     EF 45-50% in 8/12, cath 6/12 showed normal coronaries, EF 50-55% by LV gram  . Morbid obesity   . Allergic rhinitis   . HLD (hyperlipidemia)   . OSA on CPAP     sleep study in 8/12 showed moderate to severe OSA requiring CPAP  . Chronic lower back pain     secondary to DJD, obsetiy, hip problems. Followed by Dr. Ivory Broad (pain management)  . Chronic diastolic heart failure      Primarily diastolic CHF: Likely due to uncontrolled HTN. Last echo (8/12) with EF 45-50%, mild to moderate LVH with some asymmetric septal hypertrophy, RV normal size and systolic function. EF 50-55% by LV-gram in 6/12.   . Polyneuropathy in  diabetes(357.2)   . Thoracic or lumbosacral neuritis or radiculitis, unspecified   . Calcifying tendinitis of shoulder   . LBBB (left bundle branch block)   . Chronic combined systolic and diastolic CHF (congestive heart failure)     EF 40-45% by echo 12/06/2012  . Coronary artery disease     questionable. LHC 05/2011 showing normal coronaries // Followed at [facility], Dr. Shirlee Latch  . GERD (gastroesophageal reflux disease)   . Shortness of breath   . Asthma   . Automatic implantable cardioverter-defibrillator in situ   . Stroke 12/2013    "my left side is paralyzed" (07/04/2014)  . Type II diabetes mellitus DX: 2002  . 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 Degeneration of lumbar or lumbosacral intervertebral disc   . Arthritis     "hips, back,  legs, arms" (07/04/2014)  . Frequent UTI    Past Surgical History  Procedure Laterality Date  . Tubal ligation  05/31/1985  . Carpal tunnel release Left   . Hemiarthroplasty shoulder fracture Right 1980's  . Multiple tooth extractions  ~ 2011    tumors removed ; "my whole top"  . Cardiac catheterization  05/2011  . Tee without cardioversion N/A 01/14/2014    Procedure: TRANSESOPHAGEAL ECHOCARDIOGRAM (TEE);  Surgeon: Thurmon Fair, MD;  Location: [facility] ENDOSCOPY;  Service: Cardiovascular;  Laterality: N/A;  . Breast surgery Bilateral 2011    patient reports benign results  . Bi-ventricular implantable cardioverter defibrillator  (crt-d)  08/2013    [name] 08/23/2013   Family History  Problem Relation Age of Onset  . Heart disease Mother 41    Died of MI at age 76 yo  . Heart disease Paternal Grandmother     requiring pacemaker.  [name] Heart disease Paternal Grandfather 55    Died of MI at possibly age 2-53yo  . Stroke Paternal Grandfather   . Heart disease Father 62    MI age 78yo requiring stenting  . Kidney disease Mother     requiring dialysis  . Congestive Heart Failure Mother   . Diabetes Father   . Diabetes Brother   . Heart disease Brother 76    MI at age 29 years old  . Breast cancer Paternal Aunt   . Breast cancer Maternal Grandmother   . Glaucoma Father    History   Social History  . Marital Status: Single    Spouse Name: N/A    Number of Children: 2  . Years of Education: 9th grade   Occupational History  . Unemployed     planning on getting disability   Social History Main Topics  . Smoking status: Never Smoker   . Smokeless tobacco: Never Used  . Alcohol Use: No  . Drug Use: No  . Sexual Activity: Yes    Birth Control/ Protection: Surgical   Other Topics Concern  . Not on file   Social History Narrative   Lives in [place] with her son. Is able to read and write fluently in [place].    Review of Systems: Review of Systems  Unable to perform ROS:  mental status change  Constitutional: Positive for malaise/fatigue and diaphoresis. Negative for fever, chills and weight loss.  HENT: Negative for congestion, ear discharge, ear pain, hearing loss, nosebleeds, sore throat and tinnitus.   Eyes: Negative.   Respiratory: Negative.  Negative for stridor.   Cardiovascular: Negative.   Gastrointestinal: Negative.   Genitourinary: Negative.   Musculoskeletal: Negative.  Skin: Negative for itching and rash.  Neurological: Positive for speech change, focal weakness, seizures, loss of consciousness and weakness. Negative for headaches.  Endo/Heme/Allergies: Negative.   Psychiatric/Behavioral: Negative.      Physical Exam: Blood pressure 166/99, pulse 110, temperature 97.8 F (36.6 C), temperature source Oral, resp. rate 15, height 5\' 9"  (1.753 m), weight 119.432 kg (263 lb 4.8 oz), last menstrual period 09/26/2014, SpO2 100 %. Physical Exam  Constitutional: She is oriented to person, place, and time.  Communicating partially. Unable to do full exam because of her mental status.  HENT:  Head: Normocephalic and atraumatic.  Right Ear: External ear normal.  Left Ear: External ear normal.  Nose: Nose normal.  Mouth/Throat: Oropharynx is clear and moist. No oropharyngeal exudate.  Eyes: Conjunctivae and EOM are normal. Pupils are equal, round, and reactive to light. Right eye exhibits no discharge. Left eye exhibits no discharge.  Neck: No JVD present. No tracheal deviation present.  Cardiovascular: Regular rhythm.  Exam reveals no gallop and no friction rub.   No murmur heard. Respiratory: Effort normal and breath sounds normal. No respiratory distress. She has no wheezes. She has no rales. She exhibits no tenderness.  GI: Soft. Bowel sounds are normal.  Musculoskeletal:  Residual weakness on the left leg. Has spastic upper extremity. Has left facial droop. No edema.  Lymphadenopathy:    She has no cervical adenopathy.  Neurological: She is  oriented to person, place, and time.  Is able to move extremities when asked. Drowsy. Oriented to place, person, and time. But doesn't seem to know her PCP whom she knows pretty well at baseline.   Skin: She is diaphoretic.    Lab results: Basic Metabolic Panel:  Recent Labs  27/78/24 1125  NA 138  K 3.6*  CL 97  CO2 23  GLUCOSE 242*  BUN 12  CREATININE 0.77  CALCIUM 9.2   Liver Function Tests:  Recent Labs  10/14/14 1125  AST 13  ALT 10  ALKPHOS 75  BILITOT 0.6  PROT 8.0  ALBUMIN 4.1   No results for input(s): LIPASE, AMYLASE in the last 72 hours. No results for input(s): AMMONIA in the last 72 hours. CBC:  Recent Labs  10/14/14 1125  WBC 15.2*  HGB 13.1  HCT 40.0  MCV 84.2  PLT 251   Cardiac Enzymes:  Recent Labs  10/14/14 1125  TROPONINI <0.30   BNP: No results for input(s): PROBNP in the last 72 hours. D-Dimer: No results for input(s): DDIMER in the last 72 hours. CBG: No results for input(s): GLUCAP in the last 72 hours. Hemoglobin A1C: No results for input(s): HGBA1C in the last 72 hours. Fasting Lipid Panel: No results for input(s): CHOL, HDL, LDLCALC, TRIG, CHOLHDL, LDLDIRECT in the last 72 hours. Thyroid Function Tests: No results for input(s): TSH, T4TOTAL, FREET4, T3FREE, THYROIDAB in the last 72 hours. Anemia Panel: No results for input(s): VITAMINB12, FOLATE, FERRITIN, TIBC, IRON, RETICCTPCT in the last 72 hours. Coagulation: No results for input(s): LABPROT, INR in the last 72 hours. Urine Drug Screen: Drugs of Abuse     Component Value Date/Time   LABOPIA NONE DETECTED 01/09/2014 2030   COCAINSCRNUR NONE DETECTED 01/09/2014 2030   LABBENZ NONE DETECTED 01/09/2014 2030   AMPHETMU NONE DETECTED 01/09/2014 2030   THCU NONE DETECTED 01/09/2014 2030   LABBARB NONE DETECTED 01/09/2014 2030    Alcohol Level: No results for input(s): ETH in the last 72 hours. Urinalysis: No results for input(s): COLORURINE, LABSPEC, PHURINE,  GLUCOSEU, HGBUR, BILIRUBINUR, KETONESUR, PROTEINUR, UROBILINOGEN, NITRITE, LEUKOCYTESUR in the last 72 hours.  Invalid input(s): APPERANCEUR Misc. Labs:   Imaging results:  Ct Head Wo Contrast  10/14/2014   CLINICAL DATA:  48 year old female with episode of unresponsiveness this morning, seizure-like activity. Hypertensive on presentation. Initial encounter. History of stroke with home health aide.  EXAM: CT HEAD WITHOUT CONTRAST  TECHNIQUE: Contiguous axial images were obtained from the base of the skull through the vertex without intravenous contrast.  COMPARISON:  Head CTs without contrast 01/11/2014 and earlier.  FINDINGS: Stable and negative paranasal sinuses and mastoids. No acute osseous abnormality identified. Visualized orbits and scalp soft tissues are within normal limits.  Sequelae all of right MCA infarct with expected evolution of encephalomalacia. No midline shift, mass effect, or evidence of intracranial mass lesion. No ventriculomegaly. No acute intracranial hemorrhage identified. Stable gray-white matter differentiation elsewhere. No evidence of cortically based acute infarction identified. No suspicious intracranial vascular hyperdensity.  IMPRESSION: Chronic right MCA infarct with interval evolution of encephalomalacia. No acute intracranial abnormality.   Electronically Signed   By: Augusto Gamble M.D.   On: 10/14/2014 12:36    Other results: EKG: Atrial-sensed ventricular-paced rhythm   Assessment & Plan by Problem: Principal Problem:   New onset seizure Active Problems:   Benign essential HTN   Type 2 diabetes mellitus, uncontrolled, with neuropathy   History of right MCA stroke   Chronic diastolic heart failure  48 yo female with hx of HTN, HLD, DM II, CAD, CHF s/p pacemaker, CVA 12/2013 with left sided residual weakness who presents with new onset seizure.   New onset seizure likely 2/2 to previous large R MCA stroke. Per neuro, this is common in people with a big stroke  within a year.  Other etiology is meningitis. But no fever. WBC elevated 15 but could be from seizure.   Ativan in the emergency room with no further seizure activity noted.  Loading dose of Keppra 1000 mg IV and started on maintenance dose of 500 mg of Keppra twice a day.  - will not do LP currently. Will see if her mental status improves which we are expecting to happen if she had stroke with post ictal confusion. If doesn't improve or develops fever, will proceed to get LP and start meningitis coverage. - continue keppra 500mg  bid, EEG.  - Ativan IV 1-2 mg PRN for recurrent seizures. - outpatient neuro f/up when d/c  CHF  - 35-40%, diastolic dysfunction - s/p pacemaker.   DM II  - lantus 10 am and 35 pm at home. Will continue when more alert and able to take diet. - have SSI for now. When eating, restart 50% of total lantus if eating.  HTN - takes amlodopine 5mg , toprol xl 50mg  at home. Restarted. - hydralazine 10mg  IV q8hr >180/110.  Dispo: Disposition is deferred at this time, awaiting improvement of current medical problems. Anticipated discharge in approximately 2-3 day(s).   The patient does have a current PCP , MD) and does need an [facility] hospital follow-up appointment after discharge.  The patient does have transportation limitations that hinder transportation to clinic appointments.  Signed: , MD 10/14/2014, 4:59 PM

## 2014-10-14 NOTE — ED Provider Notes (Signed)
Pt had seizure episode while in Ed It terminated spontaneously She was given ativan She is starting to arouse D/w dr Roseanne Reno, neuro, recommends IV keppra D/w admitting team   Joya Gaskins, MD 10/14/14 1524

## 2014-10-14 NOTE — Consult Note (Signed)
Reason for Consult: New-onset generalized seizures.  HPI:                                                                                                                                          [name] is an 48 y.o. 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 Patient had a recurrent seizure in the emergency room which was also witnessed. CT scan of her head showed no acute intracranial abnormality. Encephalomalacia secondary to right MCA stroke in January was noted. She was given Ativan in the emergency room with no further seizure activity noted. She was subsequently given a loading dose of Keppra 1000 mg IV and started on maintenance dose of 500 mg of Keppra twice a day.  Past Medical History  Diagnosis Date  . Hypertension     Poorly controlled. Has had HTN since age 63. Angioedema with ACEI.  24 Hr urine and renal arterial dopplers ordered . . . Never done  . NICM (nonischemic cardiomyopathy)     EF 45-50% in 8/12, cath 6/12 showed normal coronaries, EF 50-55% by LV gram  . Morbid obesity   . Allergic rhinitis   . HLD (hyperlipidemia)   . OSA on CPAP     sleep study in 8/12 showed moderate to severe OSA requiring CPAP  . Chronic lower back pain     secondary to DJD, obsetiy, hip problems. Followed by Dr. Oval Linsey (pain management)  . Chronic diastolic heart failure      Primarily diastolic CHF: Likely due to uncontrolled HTN. Last echo (8/12) with EF 45-50%, mild to moderate LVH with some asymmetric septal hypertrophy, RV normal size and systolic function. EF 50-55% by LV-gram in 6/12.   . Polyneuropathy in diabetes(357.2)   . Thoracic or lumbosacral neuritis or radiculitis, unspecified   . Calcifying tendinitis of shoulder   . LBBB (left bundle branch  block)   . Chronic combined systolic and diastolic CHF (congestive heart failure)     EF 40-45% by echo 12/06/2012  . Coronary artery disease     questionable. LHC 05/2011 showing normal coronaries // Followed at [facility], Dr. Aundra Dubin  . GERD (gastroesophageal reflux disease)   . Shortness of breath   . Asthma   . Automatic implantable cardioverter-defibrillator in situ   . Stroke 12/2013    "my left side is paralyzed" (07/04/2014)  . Type II diabetes mellitus DX: 2002  . 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 Degeneration of lumbar or lumbosacral intervertebral disc   . Arthritis     "hips, back, legs, arms" (07/04/2014)  . Frequent UTI     Past Surgical History  Procedure Laterality Date  . Tubal ligation  05/31/1985  . Carpal tunnel release Left   . Hemiarthroplasty shoulder fracture Right 1980's  . Multiple tooth extractions  ~ 2011    tumors removed ; "my whole top"  . Cardiac catheterization  05/2011  . Tee without cardioversion N/A 01/14/2014    Procedure: TRANSESOPHAGEAL ECHOCARDIOGRAM (TEE);  Surgeon: Sanda Klein, MD;  Location: [facility] ENDOSCOPY;  Service: Cardiovascular;  Laterality: N/A;  . Breast surgery Bilateral 2011    patient reports benign results  . Bi-ventricular implantable cardioverter defibrillator  (crt-d)  08/2013    Archie Endo 08/23/2013    Family History  Problem Relation Age of Onset  . Heart disease Mother 36    Died of MI at age 59 yo  . Heart disease Paternal Grandmother     requiring pacemaker.  [name] Heart disease Paternal Grandfather 63    Died of MI at possibly age 89-53yo  . Stroke Paternal Grandfather   . Heart disease Father 19    MI age 81yo requiring stenting  . Kidney disease Mother     requiring dialysis  . Congestive Heart Failure Mother   . Diabetes Father   . Diabetes Brother   . Heart disease Brother 30    MI at age 45 years old  . Breast cancer Paternal Aunt   . Breast cancer Maternal Grandmother   .  Glaucoma Father     Social History:  reports that she has never smoked. She has never used smokeless tobacco. She reports that she does not drink alcohol or use illicit drugs.  Allergies  Allergen Reactions  . Clindamycin Anaphylaxis and Swelling  . Ace Inhibitors Swelling  . Enalapril Maleate Swelling  . Lisinopril Swelling    MEDICATIONS:                                                                                                                     I have reviewed the patient's current medications.   ROS:                                                                                                                                       History obtained from father and sibling  General ROS: negative for - chills, fatigue, fever, night sweats, weight gain or weight loss Psychological ROS: negative for - behavioral disorder, hallucinations, memory difficulties, mood swings or suicidal ideation Ophthalmic ROS:  negative for - blurry vision, double vision, eye pain or loss of vision ENT ROS: negative for - epistaxis, nasal discharge, oral lesions, sore throat, tinnitus or vertigo Allergy and Immunology ROS: negative for - hives or itchy/watery eyes Hematological and Lymphatic ROS: negative for - bleeding problems, bruising or swollen lymph nodes Endocrine ROS: negative for - galactorrhea, hair pattern changes, polydipsia/polyuria or temperature intolerance Respiratory ROS: negative for - cough, hemoptysis, shortness of breath or wheezing Cardiovascular ROS: negative for - chest pain, dyspnea on exertion, edema or irregular heartbeat Gastrointestinal ROS: negative for - abdominal pain, diarrhea, hematemesis, nausea/vomiting or stool incontinence Genito-Urinary ROS: negative for - dysuria, hematuria, incontinence or urinary frequency/urgency Musculoskeletal ROS: negative for - joint swelling or muscular weakness Neurological ROS: as noted in HPI Dermatological ROS: negative for rash  and skin lesion changes   Blood pressure 166/99, pulse 110, temperature 97.8 F (36.6 C), temperature source Oral, resp. rate 15, last menstrual period 09/26/2014, SpO2 100 %.   Neurologic Examination:                                                                                                      Mental Status: Drowsy, but  orientedto time and place, no acute distress.  Speech slightly slurred commensurate with level of alertness without evidence of aphasia. Able to follow commands without difficulty. Cranial Nerves: II-Visual fields were normal to finger counting. III/IV/VI-Pupils were equal and reacted. Extraocular movements were full and conjugate.    V/VII-no facial numbness; moderate left lower. VIII-normal. X-slightly dysarthric. Motor: left hemiplegia noted with no intact voluntary movements of left extremities; increased muscle tone of left extremities compared to right extremities; normal strength of right extremities.. Deep Tendon Reflexes: asymmetric with greater responses elicited from left extremities compared to right extremities. Plantars: mute bilaterally Cerebellar: Normal finger-to-nose testing with use of right upper extremity.  Lab Results  Component Value Date/Time   CHOL 187 01/10/2014 05:41 AM    Results for orders placed or performed during the hospital encounter of 10/14/14 (from the past 48 hour(s))  Troponin I     Status: None   Collection Time: 10/14/14 11:25 AM  Result Value Ref Range   Troponin I <0.30 <0.30 ng/mL    Comment:        Due to the release kinetics of cTnI, a negative result within the first hours of the onset of symptoms does not rule out myocardial infarction with certainty. If myocardial infarction is still suspected, repeat the test at appropriate intervals.   CBC     Status: Abnormal   Collection Time: 10/14/14 11:25 AM  Result Value Ref Range   WBC 15.2 (H) 4.0 - 10.5 K/uL   RBC 4.75 3.87 - 5.11 MIL/uL   Hemoglobin 13.1  12.0 - 15.0 g/dL   HCT 52.2 61.6 - 74.6 %   MCV 84.2 78.0 - 100.0 fL   MCH 27.6 26.0 - 34.0 pg   MCHC 32.8 30.0 - 36.0 g/dL   RDW 28.2 86.6 - 89.3 %   Platelets 251 150 - 400 K/uL  Comprehensive metabolic  panel     Status: Abnormal   Collection Time: 10/14/14 11:25 AM  Result Value Ref Range   Sodium 138 137 - 147 mEq/L   Potassium 3.6 (L) 3.7 - 5.3 mEq/L   Chloride 97 96 - 112 mEq/L   CO2 23 19 - 32 mEq/L   Glucose, Bld 242 (H) 70 - 99 mg/dL   BUN 12 6 - 23 mg/dL   Creatinine, Ser 0.77 0.50 - 1.10 mg/dL   Calcium 9.2 8.4 - 10.5 mg/dL   Total Protein 8.0 6.0 - 8.3 g/dL   Albumin 4.1 3.5 - 5.2 g/dL   AST 13 0 - 37 U/L   ALT 10 0 - 35 U/L   Alkaline Phosphatase 75 39 - 117 U/L   Total Bilirubin 0.6 0.3 - 1.2 mg/dL   GFR calc non Af Amer >90 >90 mL/min   GFR calc Af Amer >90 >90 mL/min    Comment: (NOTE) The eGFR has been calculated using the CKD EPI equation. This calculation has not been validated in all clinical situations. eGFR's persistently <90 mL/min signify possible Chronic Kidney Disease.    Anion gap 18 (H) 5 - 15  Digoxin level     Status: Abnormal   Collection Time: 10/14/14 11:25 AM  Result Value Ref Range   Digoxin Level <0.3 (L) 0.8 - 2.0 ng/mL    Ct Head Wo Contrast  10/14/2014   CLINICAL DATA:  48 year old female with episode of unresponsiveness this morning, seizure-like activity. Hypertensive on presentation. Initial encounter. History of stroke with home health aide.  EXAM: CT HEAD WITHOUT CONTRAST  TECHNIQUE: Contiguous axial images were obtained from the base of the skull through the vertex without intravenous contrast.  COMPARISON:  Head CTs without contrast 01/11/2014 and earlier.  FINDINGS: Stable and negative paranasal sinuses and mastoids. No acute osseous abnormality identified. Visualized orbits and scalp soft tissues are within normal limits.  Sequelae all of right MCA infarct with expected evolution of encephalomalacia. No midline shift, mass  effect, or evidence of intracranial mass lesion. No ventriculomegaly. No acute intracranial hemorrhage identified. Stable gray-white matter differentiation elsewhere. No evidence of cortically based acute infarction identified. No suspicious intracranial vascular hyperdensity.  IMPRESSION: Chronic right MCA infarct with interval evolution of encephalomalacia. No acute intracranial abnormality.   Electronically Signed   By: Lars Pinks M.D.   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 Most likely etiology as late complication of stroke, as seizures related to cerebral infarctions typically begin 6-12 months after stroke onset. Recurrent stroke is less likely but cannot be completely ruled out without MRI study. Unfortunately, MRI cannot be done because patient has an implanted defibrillator. Repeat stroke workup is not indicated. Elevated WBC count is most likely due to generalized seizure activity and the margination of WBCs, particularly in the absence of fever.  Recommendations: 1. Continue Keppra 500 mg twice a day 2. EEG, routine adult study 3. Ativan IV when necessary 1-2 mg for recurrent seizure activity 4. Patient will need outpatient neurology follow-up following discharge   C.R. Nicole Kindred, MD [facility] [phone]  10/14/2014, 4:44 PM

## 2014-10-14 NOTE — ED Provider Notes (Signed)
Patient seen/examined in the Emergency Department in conjunction with Resident Physician Provider Rivet Patient presents after possible syncopal episode at home, now awake/alert Exam : awake/alert.  She has no complaints.  She has left sided weakness ( baseline s/p stroke) Plan: syncopal workup pending, including interrogate ICD    Joya Gaskins, MD 10/14/14 1153

## 2014-10-14 NOTE — ED Notes (Signed)
[facility]- Pt is from home, had stroke in January, lives at home with home health aide, she had episode this morning of unresponsiveness. No seizure like activity present. Hypertensive with EMS 203/123, CBG of 200. Pt is A&O X4, denies any pain.

## 2014-10-14 NOTE — ED Notes (Signed)
Wasted remainder of cabinet override (1.5 mg) with Elliot Gurney, Charity fundraiser.

## 2014-10-15 ENCOUNTER — Inpatient Hospital Stay (HOSPITAL_COMMUNITY): Payer: Medicaid Other

## 2014-10-15 ENCOUNTER — Telehealth: Payer: Self-pay | Admitting: Neurology

## 2014-10-15 DIAGNOSIS — R531 Weakness: Secondary | ICD-10-CM

## 2014-10-15 DIAGNOSIS — G039 Meningitis, unspecified: Secondary | ICD-10-CM

## 2014-10-15 DIAGNOSIS — I69398 Other sequelae of cerebral infarction: Principal | ICD-10-CM

## 2014-10-15 LAB — CBC WITH DIFFERENTIAL/PLATELET
BASOS ABS: 0 10*3/uL (ref 0.0–0.1)
BASOS PCT: 0 % (ref 0–1)
Eosinophils Absolute: 0.1 10*3/uL (ref 0.0–0.7)
Eosinophils Relative: 0 % (ref 0–5)
HCT: 36.1 % (ref 36.0–46.0)
Hemoglobin: 12 g/dL (ref 12.0–15.0)
Lymphocytes Relative: 24 % (ref 12–46)
Lymphs Abs: 2.6 10*3/uL (ref 0.7–4.0)
MCH: 27.3 pg (ref 26.0–34.0)
MCHC: 33.2 g/dL (ref 30.0–36.0)
MCV: 82 fL (ref 78.0–100.0)
Monocytes Absolute: 0.9 10*3/uL (ref 0.1–1.0)
Monocytes Relative: 8 % (ref 3–12)
NEUTROS ABS: 7.6 10*3/uL (ref 1.7–7.7)
NEUTROS PCT: 68 % (ref 43–77)
Platelets: 256 10*3/uL (ref 150–400)
RBC: 4.4 MIL/uL (ref 3.87–5.11)
RDW: 12.9 % (ref 11.5–15.5)
WBC: 11.2 10*3/uL — ABNORMAL HIGH (ref 4.0–10.5)

## 2014-10-15 LAB — BASIC METABOLIC PANEL
Anion gap: 17 — ABNORMAL HIGH (ref 5–15)
BUN: 11 mg/dL (ref 6–23)
CHLORIDE: 99 meq/L (ref 96–112)
CO2: 21 mEq/L (ref 19–32)
Calcium: 9.1 mg/dL (ref 8.4–10.5)
Creatinine, Ser: 0.61 mg/dL (ref 0.50–1.10)
GFR calc non Af Amer: >90 mL/min (ref >90)
Glucose, Bld: 145 mg/dL — ABNORMAL HIGH (ref 70–99)
POTASSIUM: 3.8 meq/L (ref 3.7–5.3)
Sodium: 137 mEq/L (ref 137–147)

## 2014-10-15 LAB — GLUCOSE, CAPILLARY
GLUCOSE-CAPILLARY: 192 mg/dL — AB (ref 70–99)
GLUCOSE-CAPILLARY: 212 mg/dL — AB (ref 70–99)
Glucose-Capillary: 155 mg/dL — ABNORMAL HIGH (ref 70–99)
Glucose-Capillary: 213 mg/dL — ABNORMAL HIGH (ref 70–99)

## 2014-10-15 LAB — HIV ANTIBODY (ROUTINE TESTING W REFLEX)
HIV: NONREACTIVE
HIV: NONREACTIVE

## 2014-10-15 MED ORDER — LEVETIRACETAM 500 MG PO TABS
500.0000 mg | ORAL_TABLET | Freq: Two times a day (BID) | ORAL | Status: DC
  Administered 2014-10-15 – 2014-10-16 (×3): 500 mg via ORAL
  Filled 2014-10-15 (×4): qty 1

## 2014-10-15 NOTE — Telephone Encounter (Signed)
Sorry, I will be out next week and looks pretty tight otherwise, can keep appt with Dr. Everlena Cooper on 11/18. Thanks

## 2014-10-15 NOTE — Procedures (Signed)
ELECTROENCEPHALOGRAM REPORT  Patient: [name]       Room #: 6I68 EEG No. ID: [id number] Age: 48 y.o.        Sex: female Referring Physician: [name], J  Report Date:  10/15/2014        Interpreting Physician: Aline Brochure  History: [name] is an 48 y.o. female with diabetes, hypertension, hyperlipidemia and coronary artery disease who suffered a right MCA stroke in January 2015 and presented with new onset generalized seizure activity on 10/14/2014.  Indications for study:  New to my practice-onset seizure disorder  Technique: This is an 18 channel routine scalp EEG performed at the bedside with bipolar and monopolar montages arranged in accordance to the international 10/20 system of electrode placement.   Description: This EEG recording was performed during wakefulness and during sleep. Background activity during wakefulness was asymmetrical with normal activity recorded from the left hemisphere and moderate slowing of background activity recorded from the right. Photic stimulation and hyperventilation were not performed. There was progressive slowing of cerebral activity with mixed delta and theta activity during drowsiness. Symmetrical sleep spindles, vertex waves and arousal responses were recorded during stage II of sleep. No epileptiform discharges occurred during wakefulness nor during sleep.  Interpretation: This EEG is abnormal with moderate slowing of cerebral activity recorded from the right hemisphere consistent with patient's history of right MCA territory stroke. EEG study was otherwise unremarkable with no interictal epileptiform activity recorded.   [name] M.D. [facility] [phone]

## 2014-10-15 NOTE — Progress Notes (Signed)
  Date: 10/15/2014  Patient name: [name]  Medical record number: [id number]  Date of birth: June 09, 1966   This patient has been seen and the plan of care was discussed with the house staff. Please see their note for complete details. I concur with their findings with the following additions/corrections:  C/o IV leaking.  Appreciate neuro f/u note.  New seizure d/o related to prev CVA.  Await result of her EEG.  Continue keppra.  F/u PCP, Neuro  Ginnie Smart, MD 10/15/2014, 1:59 PM

## 2014-10-15 NOTE — Plan of Care (Signed)
Problem: Phase I Progression Outcomes Goal: Pain controlled with appropriate interventions Outcome: Completed/Met Date Met:  10/15/14     

## 2014-10-15 NOTE — Telephone Encounter (Signed)
[place] is in [place] and they called for appt with in 2 weeks and you did not have anything so we gave them appt to see Dr [name] on 10-30-14. [name] wanted to know if it is something that you want to look at your sch to see if you could work in somewhere or keep with [employer]. Please let me know thank you

## 2014-10-15 NOTE — Progress Notes (Signed)
EEG completed, results pending. 

## 2014-10-15 NOTE — Progress Notes (Signed)
Subjective: Feeling good today. Sitting in chair during interview. Denies any symptom other than her residual weakness of left extremities and facial droop left sided. No fever/chills/n/v.  Objective: Vital signs in last 24 hours: Filed Vitals:   10/14/14 1820 10/14/14 1900 10/15/14 0559 10/15/14 0956  BP: 166/96 143/92 123/67 124/83  Pulse: 102 106 84 100  Temp: 97.8 F (36.6 C) 98.6 F (37 C) 98.1 F (36.7 C) 98.6 F (37 C)  TempSrc: Oral   Oral  Resp: 17 18 18 18   Height:      Weight:  118.389 kg (261 lb)    SpO2: 100% 98% 97% 100%   Weight change:   Intake/Output Summary (Last 24 hours) at 10/15/14 1412 Last data filed at 10/15/14 0300  Gross per 24 hour  Intake    100 ml  Output      0 ml  Net    100 ml   Vitals reviewed. General: resting in bed, NAD. HEENT: PERRL, EOMI, no scleral icterus Cardiac: RRR, no rubs, murmurs or gallops Pulm: clear to auscultation bilaterally, no wheezes, rales, or rhonchi Abd: soft, nontender, nondistended, BS present Ext: 5/5 on right extremities. Left sided spastic paresis on upper and lower exts.  Neuro: alert and oriented X4 today., cranial nerves II-XII grossly intact, strength and sensation to light touch equal in bilateral upper and lower extremities  Lab Results: Basic Metabolic Panel:  Recent Labs Lab 10/14/14 1125 10/14/14 1934 10/15/14 0639  NA 138  --  137  K 3.6*  --  3.8  CL 97  --  99  CO2 23  --  21  GLUCOSE 242*  --  145*  BUN 12  --  11  CREATININE 0.77  --  0.61  CALCIUM 9.2  --  9.1  MG  --  1.7  --    Liver Function Tests:  Recent Labs Lab 10/14/14 1125  AST 13  ALT 10  ALKPHOS 75  BILITOT 0.6  PROT 8.0  ALBUMIN 4.1   No results for input(s): LIPASE, AMYLASE in the last 168 hours. No results for input(s): AMMONIA in the last 168 hours. CBC:  Recent Labs Lab 10/14/14 1125 10/15/14 0639  WBC 15.2* 11.2*  NEUTROABS  --  7.6  HGB 13.1 12.0  HCT 40.0 36.1  MCV 84.2 82.0  PLT 251 256     Cardiac Enzymes:  Recent Labs Lab 10/14/14 1125  TROPONINI <0.30   BNP: No results for input(s): PROBNP in the last 168 hours. D-Dimer: No results for input(s): DDIMER in the last 168 hours. CBG:  Recent Labs Lab 10/14/14 1708 10/14/14 2011 10/15/14 0755 10/15/14 1202  GLUCAP 203* 184* 155* 192*   Hemoglobin A1C: No results for input(s): HGBA1C in the last 168 hours. Fasting Lipid Panel: No results for input(s): CHOL, HDL, LDLCALC, TRIG, CHOLHDL, LDLDIRECT in the last 168 hours. Thyroid Function Tests: No results for input(s): TSH, T4TOTAL, FREET4, T3FREE, THYROIDAB in the last 168 hours. Coagulation: No results for input(s): LABPROT, INR in the last 168 hours. Anemia Panel: No results for input(s): VITAMINB12, FOLATE, FERRITIN, TIBC, IRON, RETICCTPCT in the last 168 hours. Urine Drug Screen: Drugs of Abuse     Component Value Date/Time   LABOPIA NONE DETECTED 01/09/2014 2030   COCAINSCRNUR NONE DETECTED 01/09/2014 2030   LABBENZ NONE DETECTED 01/09/2014 2030   AMPHETMU NONE DETECTED 01/09/2014 2030   THCU NONE DETECTED 01/09/2014 2030   LABBARB NONE DETECTED 01/09/2014 2030    Alcohol Level:  No results for input(s): ETH in the last 168 hours. Urinalysis: No results for input(s): COLORURINE, LABSPEC, PHURINE, GLUCOSEU, HGBUR, BILIRUBINUR, KETONESUR, PROTEINUR, UROBILINOGEN, NITRITE, LEUKOCYTESUR in the last 168 hours.  Invalid input(s): APPERANCEUR Misc. Labs:  Micro Results: No results found for this or any previous visit (from the past 240 hour(s)). Studies/Results: Ct Head Wo Contrast  10/14/2014   CLINICAL DATA:  48 year old female with episode of unresponsiveness this morning, seizure-like activity. Hypertensive on presentation. Initial encounter. History of stroke with home health aide.  EXAM: CT HEAD WITHOUT CONTRAST  TECHNIQUE: Contiguous axial images were obtained from the base of the skull through the vertex without intravenous contrast.   COMPARISON:  Head CTs without contrast 01/11/2014 and earlier.  FINDINGS: Stable and negative paranasal sinuses and mastoids. No acute osseous abnormality identified. Visualized orbits and scalp soft tissues are within normal limits.  Sequelae all of right MCA infarct with expected evolution of encephalomalacia. No midline shift, mass effect, or evidence of intracranial mass lesion. No ventriculomegaly. No acute intracranial hemorrhage identified. Stable gray-white matter differentiation elsewhere. No evidence of cortically based acute infarction identified. No suspicious intracranial vascular hyperdensity.  IMPRESSION: Chronic right MCA infarct with interval evolution of encephalomalacia. No acute intracranial abnormality.   Electronically Signed   By: Augusto Gamble M.D.   On: 10/14/2014 12:36   Medications: I have reviewed the patient's current medications. Scheduled Meds: . amLODipine  5 mg Oral Daily  . antiseptic oral rinse  7 mL Mouth Rinse BID  . aspirin  325 mg Oral Daily  . digoxin  0.125 mg Oral Daily  . enoxaparin (LOVENOX) injection  40 mg Subcutaneous Q24H  . insulin aspart  0-15 Units Subcutaneous TID WC  . levETIRAcetam  500 mg Oral BID  . metoprolol succinate  50 mg Oral Daily   Continuous Infusions:  PRN Meds:.hydrALAZINE, LORazepam Assessment/Plan: Principal Problem:   New onset seizure Active Problems:   Benign essential HTN   Type 2 diabetes mellitus, uncontrolled, with neuropathy   History of right MCA stroke   Chronic diastolic heart failure  48 yo female with hx of HTN, HLD, DM II, CAD, CHF s/p pacemaker, CVA 12/2013 with left sided residual weakness who presents with new onset seizure.   New onset seizure likely 2/2 to previous large R MCA stroke. Per neuro, this is common in people with a big stroke within a year.  Had episode of postictal confusion which has cleared and now she is back at her baseline mental status.  Other etiology is meningitis. But no fever. WBC  elevated 15 but could be from seizure. WBC improving  Ativan in the emergency room with no further seizure activity noted. Loaded with  Keppra 1000 mg IV and started on maintenance dose of 500 mg of Keppra twice a day.  - eeg done, read pending. Neuro ok for discharge with follow up.  - continue keppra 500mg  bid (switched to PO today). Monitor 1 more day on orals before d/c - Ativan IV 1-2 mg PRN for recurrent seizures. - outpatient neuro f/up when d/c (follow up arranged with Dr. Ernie Hew).  CHF - 35-40%, diastolic dysfunction - s/p pacemaker. stable.  DM II - lantus 10 am and 35 pm at home. Will continue when more alert and able to take diet. - have SSI for now. When eating, restart 50% of total lantus if eating.  HTN - takes amlodopine 5mg , toprol xl 50mg  at home. Restarted. Normotensive. - hydralazine 10mg  IV q8hr >180/110.  Dispo: Disposition  is deferred at this time, awaiting improvement of current medical problems.  Anticipated discharge in approximately 1-2 day(s).   The patient does have a current PCP Courtney Paris, MD) and does need an [facility] hospital follow-up appointment after discharge.  The patient does have transportation limitations that hinder transportation to clinic appointments.  .Services Needed at time of discharge: Y = Yes, Blank = No PT:   OT:   RN:   Equipment:   Other:     LOS: 1 day   Caitlyn Meeker, MD 10/15/2014, 2:12 PM

## 2014-10-15 NOTE — Plan of Care (Signed)
Problem: Phase I Progression Outcomes Goal: Seizure activity controlled Outcome: Completed/Met Date Met:  10/15/14

## 2014-10-15 NOTE — Plan of Care (Signed)
Problem: Phase I Progression Outcomes Goal: IV access obtained Outcome: Completed/Met Date Met:  10/15/14

## 2014-10-15 NOTE — Progress Notes (Signed)
Prior ESBL infection from 04/27/13 resolved.  Albert,RN notified to discontinue contact precautions and order. Laroy Apple, RN, Infection Prevention

## 2014-10-15 NOTE — Progress Notes (Signed)
Subjective: patient has had no further seizures and tolerating Keppra well.   Objective: Current vital signs: BP 124/83 mmHg  Pulse 100  Temp(Src) 98.6 F (37 C) (Oral)  Resp 18  Ht 5\' 9"  (1.753 m)  Wt 118.389 kg (261 lb)  BMI 38.53 kg/m2  SpO2 100%  LMP 09/26/2014 (Approximate) Vital signs in last 24 hours: Temp:  [97.8 F (36.6 C)-98.6 F (37 C)] 98.6 F (37 C) (11/03 0956) Pulse Rate:  [84-142] 100 (11/03 0956) Resp:  [14-25] 18 (11/03 0956) BP: (123-186)/(67-106) 124/83 mmHg (11/03 0956) SpO2:  [94 %-100 %] 100 % (11/03 0956) Weight:  [118.389 kg (261 lb)-119.432 kg (263 lb 4.8 oz)] 118.389 kg (261 lb) (11/02 1900)  Intake/Output from previous day: 11/02 0701 - 11/03 0700 In: 100 [IV Piggyback:100] Out: -  Intake/Output this shift:   Nutritional status:    Neurologic Exam: General: NAD Mental Status: Alert, oriented, thought content appropriate.  Speech slightly slurred (baseline) without evidence of aphasia.  Able to follow 3 step commands without difficulty. Cranial Nerves: II: Visual fields grossly normal, pupils equal, round, reactive to light and accommodation III,IV, VI: ptosis not present, extra-ocular motions intact bilaterally V,VII: smile symmetric, facial light touch sensation normal bilaterally VIII: hearing normal bilaterally IX,X: gag reflex present XI: bilateral shoulder shrug XII: midline tongue extension without atrophy or fasciculations  Motor: left hemiplegia noted with no intact voluntary movements of left extremities; increased muscle tone of left extremities compared to right extremities; normal strength of right extremities.. Sensory: Pinprick and light touch intact throughout, bilaterally Deep Tendon Reflexes:  Right: Upper Extremity   Left: Upper extremity   biceps (C-5 to C-6) 2/4   biceps (C-5 to C-6) 3/4 tricep (C7) 2/4    triceps (C7) 3/4 Brachioradialis (C6) 2/4  Brachioradialis (C6) 3/4  Lower Extremity Lower Extremity   quadriceps (L-2 to L-4) 2/4   quadriceps (L-2 to L-4) 3/4 Achilles (S1) 2/4   Achilles (S1) 3/4  Plantars: Right: downgoing   Left: upgoing Cerebellar: normal finger-to-nose with right hand  Lab Results: Basic Metabolic Panel:  Recent Labs Lab 10/14/14 1125 10/14/14 1934 10/15/14 0639  NA 138  --  137  K 3.6*  --  3.8  CL 97  --  99  CO2 23  --  21  GLUCOSE 242*  --  145*  BUN 12  --  11  CREATININE 0.77  --  0.61  CALCIUM 9.2  --  9.1  MG  --  1.7  --     Liver Function Tests:  Recent Labs Lab 10/14/14 1125  AST 13  ALT 10  ALKPHOS 75  BILITOT 0.6  PROT 8.0  ALBUMIN 4.1   No results for input(s): LIPASE, AMYLASE in the last 168 hours. No results for input(s): AMMONIA in the last 168 hours.  CBC:  Recent Labs Lab 10/14/14 1125 10/15/14 0639  WBC 15.2* 11.2*  NEUTROABS  --  7.6  HGB 13.1 12.0  HCT 40.0 36.1  MCV 84.2 82.0  PLT 251 256    Cardiac Enzymes:  Recent Labs Lab 10/14/14 1125  TROPONINI <0.30    Lipid Panel: No results for input(s): CHOL, TRIG, HDL, CHOLHDL, VLDL, LDLCALC in the last 168 hours.  CBG:  Recent Labs Lab 10/14/14 1708 10/14/14 2011 10/15/14 0755 10/15/14 1202  GLUCAP 203* 184* 155* 192*    Microbiology: Results for orders placed or performed during the hospital encounter of 07/04/14  Culture, Urine     Status: None  Collection Time: 07/04/14 10:46 PM  Result Value Ref Range Status   Specimen Description URINE, RANDOM  Final   Special Requests NONE  Final   Culture  Setup Time   Final    07/05/2014 00:30 Performed at [facility] Count   Final    70,000 COLONIES/ML Performed at [facility]   Culture   Final    Multiple bacterial morphotypes present, none predominant. Suggest appropriate recollection if clinically indicated. Performed at [facility]   Report Status 07/06/2014 FINAL  Final    Coagulation Studies: No results for input(s): LABPROT, INR in the last 72  hours.  Imaging: Ct Head Wo Contrast  10/14/2014   CLINICAL DATA:  48 year old female with episode of unresponsiveness this morning, seizure-like activity. Hypertensive on presentation. Initial encounter. History of stroke with home health aide.  EXAM: CT HEAD WITHOUT CONTRAST  TECHNIQUE: Contiguous axial images were obtained from the base of the skull through the vertex without intravenous contrast.  COMPARISON:  Head CTs without contrast 01/11/2014 and earlier.  FINDINGS: Stable and negative paranasal sinuses and mastoids. No acute osseous abnormality identified. Visualized orbits and scalp soft tissues are within normal limits.  Sequelae all of right MCA infarct with expected evolution of encephalomalacia. No midline shift, mass effect, or evidence of intracranial mass lesion. No ventriculomegaly. No acute intracranial hemorrhage identified. Stable gray-white matter differentiation elsewhere. No evidence of cortically based acute infarction identified. No suspicious intracranial vascular hyperdensity.  IMPRESSION: Chronic right MCA infarct with interval evolution of encephalomalacia. No acute intracranial abnormality.   Electronically Signed   By: Augusto Gamble M.D.   On: 10/14/2014 12:36    Medications:  Scheduled: . amLODipine  5 mg Oral Daily  . antiseptic oral rinse  7 mL Mouth Rinse BID  . aspirin  325 mg Oral Daily  . digoxin  0.125 mg Oral Daily  . enoxaparin (LOVENOX) injection  40 mg Subcutaneous Q24H  . insulin aspart  0-15 Units Subcutaneous TID WC  . levETIRAcetam  500 mg Intravenous Q12H  . 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 Most likely etiology as late complication of stroke, as seizures related to cerebral infarctions typically begin 6-12 months after stroke onset.   Recommend: 1) Continue  Keppra 500 mg BID--may change to oral when taking PO medications.  2) Will need out patient neurology follow up at discharge.   Neurology S/O  Felicie Morn PA-C [facility] [phone]  10/15/2014, 12:12 PM

## 2014-10-15 NOTE — Progress Notes (Signed)
Inpatient Diabetes Program Recommendations  AACE/ADA: New Consensus Statement on Inpatient Glycemic Control (2013)  Target Ranges:  Prepandial:   less than 140 mg/dL      Peak postprandial:   less than 180 mg/dL (1-2 hours)      Critically ill patients:  140 - 180 mg/dL   Inpatient Diabetes Program Recommendations Correction (SSI): change correction to Q4 coverage while NPO and no basal  Thank you  Piedad Climes BSN, RN,CDE Inpatient Diabetes Coordinator [phone] (team pager)

## 2014-10-16 ENCOUNTER — Telehealth: Payer: Self-pay | Admitting: *Deleted

## 2014-10-16 LAB — GLUCOSE, CAPILLARY
GLUCOSE-CAPILLARY: 214 mg/dL — AB (ref 70–99)
Glucose-Capillary: 193 mg/dL — ABNORMAL HIGH (ref 70–99)

## 2014-10-16 MED ORDER — LEVETIRACETAM 500 MG PO TABS: 500.0000 mg | ORAL_TABLET | Freq: Two times a day (BID) | ORAL | Status: DC

## 2014-10-16 NOTE — Progress Notes (Signed)
Patient had 10 beats of V Tach @02 :44, running V paced now.Patient asymptomatic. MD on call notified.

## 2014-10-16 NOTE — Discharge Instructions (Signed)
It was a pleasure taking care of you. You were admitted here for seizure likely due to your past stroke. You must take keppra medicine without missing any dose to control or avoid seizures. You should follow up with neurologist and your primary doctor.  Seizure, Adult A seizure is abnormal electrical activity in the brain. Seizures usually last from 30 seconds to 2 minutes. There are various types of seizures. Before a seizure, you may have a warning sensation (aura) that a seizure is about to occur. An aura may include the following symptoms:   Fear or anxiety.  Nausea.  Feeling like the room is spinning (vertigo).  Vision changes, such as seeing flashing lights or spots. Common symptoms during a seizure include:  A change in attention or behavior (altered mental status).  Convulsions with rhythmic jerking movements.  Drooling.  Rapid eye movements.  Grunting.  Loss of bladder and bowel control.  Bitter taste in the mouth.  Tongue biting. After a seizure, you may feel confused and sleepy. You may also have an injury resulting from convulsions during the seizure. HOME CARE INSTRUCTIONS   If you are given medicines, take them exactly as prescribed by your health care provider.  Keep all follow-up appointments as directed by your health care provider.  Do not swim or drive or engage in risky activity during which a seizure could cause further injury to you or others until your health care provider says it is OK.  Get adequate rest.  Teach friends and family what to do if you have a seizure. They should:  Lay you on the ground to prevent a fall.  Put a cushion under your head.  Loosen any tight clothing around your neck.  Turn you on your side. If vomiting occurs, this helps keep your airway clear.  Stay with you until you recover.  Know whether or not you need emergency care. SEEK IMMEDIATE MEDICAL CARE IF:  The seizure lasts longer than 5 minutes.  The seizure  is severe or you do not wake up immediately after the seizure.  You have an altered mental status after the seizure.  You are having more frequent or worsening seizures. Someone should drive you to the emergency department or call local emergency services (911 in [place] MAKE SURE YOU:  Understand these instructions.  Will watch your condition.  Will get help right away if you are not doing well or get worse. Document Released: 11/26/2000 Document Revised: 09/19/2013 Document Reviewed: 07/11/2013 [facility] Patient Information 2015 [place], [place]. This information is not intended to replace advice given to you by your health care provider. Make sure you discuss any questions you have with your health care provider.

## 2014-10-16 NOTE — Progress Notes (Signed)
Nutrition Brief Note  Patient identified on the Malnutrition Screening Tool (MST) Report  Wt Readings from Last 15 Encounters:  10/15/14 263 lb 1.9 oz (119.35 kg)  10/07/14 267 lb (121.11 kg)  10/01/14 266 lb 12.8 oz (121.02 kg)  09/11/14 267 lb (121.11 kg)  08/27/14 266 lb 6.4 oz (120.838 kg)  08/21/14 265 lb (120.203 kg)  08/20/14 268 lb 12.8 oz (121.927 kg)  08/07/14 267 lb 2 oz (121.167 kg)  07/31/14 265 lb (120.203 kg)  07/24/14 266 lb 4.8 oz (120.793 kg)  07/23/14 266 lb 3.2 oz (120.748 kg)  07/05/14 261 lb 14.4 oz (118.797 kg)  07/04/14 268 lb 14.4 oz (121.972 kg)  07/03/14 264 lb 3.2 oz (119.84 kg)  06/24/14 280 lb (127.007 kg)    Body mass index is 38.84 kg/(m^2). Patient meets criteria for class II obesity based on current BMI. Weight has been stable.   Current diet order is heart, patient is consuming approximately 100% of meals at this time. Labs and medications reviewed.   No nutrition interventions warranted at this time. If nutrition issues arise, please consult RD.   Caitlyn Niemann, MS, RD, LDN Pager # [phone] After hours/ weekend pager # [phone]

## 2014-10-16 NOTE — Telephone Encounter (Signed)
[facility] is requesting refill on Tamsulosin 0.4mg  # 30.  Rx was written 08/20/14. Stanton Kidney Sophee Mckimmy RN 10/16/14 2PM

## 2014-10-16 NOTE — Plan of Care (Signed)
Problem: Phase I Progression Outcomes Goal: OOB as tolerated unless otherwise ordered Outcome: Completed/Met Date Met:  10/16/14

## 2014-10-16 NOTE — Discharge Summary (Signed)
Name: [name] MRN: [id number] DOB: 24-Jun-1966 48 y.o. PCP: Courtney Paris, MD  Date of Admission: 10/14/2014 10:48 AM Date of Discharge: 10/16/2014 Attending Physician: Ginnie Smart, MD  Discharge Diagnosis: Principal Problem:   New onset seizure Active Problems:   Benign essential HTN   Type 2 diabetes mellitus, uncontrolled, with neuropathy   History of right MCA stroke   Chronic diastolic heart failure  Discharge Medications:   Medication List    TAKE these medications        amLODipine 5 MG tablet  Commonly known as:  NORVASC  Take 1 tablet (5 mg total) by mouth daily.     aspirin 325 MG tablet  Take 1 tablet (325 mg total) by mouth daily.     citalopram 20 MG tablet  Commonly known as:  CELEXA  Take 1 tablet (20 mg total) by mouth daily.     cyclobenzaprine 10 MG tablet  Commonly known as:  FLEXERIL  Take 10 mg by mouth daily.     diclofenac sodium 1 % Gel  Commonly known as:  VOLTAREN  Apply 2 g topically 4 (four) times daily as needed (pain).     digoxin 0.125 MG tablet  Commonly known as:  LANOXIN  Take 0.125 mg by mouth daily.     fluticasone 0.05 % cream  Commonly known as:  CUTIVATE  Apply topically 2 (two) times daily. Dispense two tubes     furosemide 40 MG tablet  Commonly known as:  LASIX  Take 40 mg by mouth daily as needed for fluid.     glucose blood test strip  Commonly known as:  ACCU-CHEK SMARTVIEW  Use to Check Blood Sugar TID. Code: E11.40.     hydrALAZINE 100 MG tablet  Commonly known as:  APRESOLINE  Take 0.5 tablets (50 mg total) by mouth 3 (three) times daily.     Incontinence Supplies Misc  - 1 year supply of Pull-ups in size XL  - Diagnosis Code: 788.33     Insulin Glargine 100 UNIT/ML Solostar Pen  Commonly known as:  LANTUS SOLOSTAR  Inject 10 units in the AM, 35 units in the PM     Insulin Pen Needle 31G X 5 MM Misc  Commonly known as:  GLOBAL EASE INJECT PEN NEEDLES  Use to inject insulin BID. Code:  E 11.40     isosorbide mononitrate 60 MG 24 hr tablet  Commonly known as:  IMDUR  Take 1 tablet (60 mg total) by mouth every morning.     levETIRAcetam 500 MG tablet  Commonly known as:  KEPPRA  Take 1 tablet (500 mg total) by mouth 2 (two) times daily.     metFORMIN 1000 MG tablet  Commonly known as:  GLUCOPHAGE  Take 1,000 mg by mouth 2 (two) times daily with a meal.     metoprolol succinate 25 MG 24 hr tablet  Commonly known as:  TOPROL-XL  Take 50 mg (2 tablets) twice daily.     oxyCODONE-acetaminophen 10-325 MG per tablet  Commonly known as:  PERCOCET  Take 1 tablet by mouth every 8 (eight) hours as needed for pain.     pantoprazole 40 MG tablet  Commonly known as:  PROTONIX  Take 40 mg by mouth daily.     Potassium Chloride ER 20 MEQ Tbcr  Take 20 mEq by mouth daily.     simvastatin 40 MG tablet  Commonly known as:  ZOCOR  Take 40 mg by mouth  at bedtime.     Vitamin D (Ergocalciferol) 50000 UNITS Caps capsule  Commonly known as:  DRISDOL  Take 1 capsule (50,000 Units total) by mouth every 7 (seven) days.        Disposition and follow-up:   [name] was discharged from [facility] in Stable condition.  At the hospital follow up visit please address:  1.  Please assess for medicine compliance with keppra to avoid seizures. Please make sure she follows up with neurologist (appt set up with Dr. Ernie Hew).  2.  Labs / imaging needed at time of follow-up:   3.  Pending labs/ test needing follow-up:   Follow-up Appointments:     Follow-up Information    Follow up with Lars Masson, MD On 10/24/2014.   Specialty:  Internal Medicine   Why:  appt @ 2:45pm Dr Yetta Barre.   Contact information:   [address] [place] [address] [phone]       Follow up with JAFFE, ADAM ROBERT, DO On 10/30/2014.   Specialty:  Neurology   Why:  @ 8:30 am Dr. Ernie Hew.   Contact information:   [address] [place]  [address] [phone]       Discharge Instructions:   Consultations:    Procedures Performed:  Ct Head Wo Contrast  10/14/2014   CLINICAL DATA:  48 year old female with episode of unresponsiveness this morning, seizure-like activity. Hypertensive on presentation. Initial encounter. History of stroke with home health aide.  EXAM: CT HEAD WITHOUT CONTRAST  TECHNIQUE: Contiguous axial images were obtained from the base of the skull through the vertex without intravenous contrast.  COMPARISON:  Head CTs without contrast 01/11/2014 and earlier.  FINDINGS: Stable and negative paranasal sinuses and mastoids. No acute osseous abnormality identified. Visualized orbits and scalp soft tissues are within normal limits.  Sequelae all of right MCA infarct with expected evolution of encephalomalacia. No midline shift, mass effect, or evidence of intracranial mass lesion. No ventriculomegaly. No acute intracranial hemorrhage identified. Stable gray-white matter differentiation elsewhere. No evidence of cortically based acute infarction identified. No suspicious intracranial vascular hyperdensity.  IMPRESSION: Chronic right MCA infarct with interval evolution of encephalomalacia. No acute intracranial abnormality.   Electronically Signed   By: Augusto Gamble M.D.   On: 10/14/2014 12:36    2D Echo:   Cardiac Cath:   Admission HPI:   48 yo female with hx of HTN, HLD, DM II, CAD, CHF s/p pacemaker, CVA 12/2013 with left sided residular weakness who presents after syncopal event this AM. Patient was with her home health nurse, nurse saw her sitting up in the bed and then suddenly fell and became unresponsive. Per patient's brother, she was shaking on her bed, all limbs were shaking per chart review for 3 mins. EMS was called, patient arose somewhat but remained drowsy in the ED.   Family denies any hx of seizures. No tongue biting or eye rolling per patient's brother. She didn't appear to be doing well for last few  days al though patient denies any fever or recent illness.   During our interview, patient was diaphoretic, not communicating much.   CT head showed chronic right MCA infarct but no acute abnormality. WBC is 15.2.   Hospital Course by problem list: Principal Problem:   New onset seizure Active Problems:   Benign essential HTN   Type 2 diabetes mellitus, uncontrolled, with neuropathy   History of right MCA stroke   Chronic diastolic  heart failure   48 yo female with hx of HTN, HLD, DM II, CAD, CHF s/p pacemaker, CVA 12/2013 with left sided residual weakness who presents with new onset seizure.   New onset seizure likely 2/2 to previous large R MCA stroke. Per neuro, this is common in people with a big stroke within a year. Had episode of postictal confusion which has cleared and now she is back at her baseline mental status. We also considered meningitis. But no fever. WBC elevated 15 but could be from seizure so didn't do LP.  receivedAtivan in the emergency room with no further seizure activity noted. Loaded with Keppra 1000 mg IV and started on maintenance dose of 500 mg of Keppra twice a day.  - eeg shows mod slowing from R hemisphere consistent with right MCA. Neuro ok for discharge with follow up.  - continue keppra 500mg  bid po on discharge. - outpatient neuro f/up arranged with Dr. ).  CHF - 35-40%, diastolic dysfunction - s/p pacemaker. stable.  DM II - lantus 10 am and 35 pm at home. Will continue when more alert and able to take diet. - was on ssi here. Will cont same home dose at d/c.  HTN - normotensive. takes amlodopine 5mg , toprol xl 50mg  at home. Restarted. Normotensive. Continue home regimen on d/c.  Discharge Vitals:   BP 137/92 mmHg  Pulse 95  Temp(Src) 98.3 F (36.8 C) (Oral)  Resp 18  Ht 5\' 9"  (1.753 m)  Wt 119.35 kg (263 lb 1.9 oz)  BMI 38.84 kg/m2  SpO2 96%  LMP 09/26/2014 (Approximate)  Discharge Labs:  Results for orders placed or  performed during the hospital encounter of 10/14/14 (from the past 24 hour(s))  Glucose, capillary     Status: Abnormal   Collection Time: 10/15/14  4:43 PM  Result Value Ref Range   Glucose-Capillary 212 (H) 70 - 99 mg/dL  Glucose, capillary     Status: Abnormal   Collection Time: 10/15/14  9:41 PM  Result Value Ref Range   Glucose-Capillary 213 (H) 70 - 99 mg/dL  Glucose, capillary     Status: Abnormal   Collection Time: 10/16/14  8:10 AM  Result Value Ref Range   Glucose-Capillary 214 (H) 70 - 99 mg/dL  Glucose, capillary     Status: Abnormal   Collection Time: 10/16/14 12:42 PM  Result Value Ref Range   Glucose-Capillary 193 (H) 70 - 99 mg/dL   Comment 1 Notify RN    Comment 2 Documented in Chart     Signed: 13/03/15, MD 10/16/2014, 1:46 PM    Services Ordered on Discharge:  Equipment Ordered on Discharge:

## 2014-10-16 NOTE — Plan of Care (Signed)
Problem: Phase II Progression Outcomes Goal: Pain controlled Outcome: Progressing                  

## 2014-10-16 NOTE — Progress Notes (Signed)
Subjective: Feeling good today. Denies any symptom other than her residual weakness of left extremities and facial droop left sided. No fever/chills/n/v.  Objective: Vital signs in last 24 hours: Filed Vitals:   10/15/14 0956 10/15/14 1800 10/15/14 2138 10/16/14 0449  BP: 124/83 115/97 147/94 128/84  Pulse: 100 96 95 90  Temp: 98.6 F (37 C) 98.3 F (36.8 C) 98.3 F (36.8 C) 97.7 F (36.5 C)  TempSrc: Oral Oral Oral Oral  Resp: 18 18 17 17   Height:   5\' 9"  (1.753 m)   Weight:   119.35 kg (263 lb 1.9 oz)   SpO2: 100% 100% 100% 95%   Weight change: -0.082 kg (-2.9 oz)  Intake/Output Summary (Last 24 hours) at 10/16/14 0724 Last data filed at 10/15/14 1400  Gross per 24 hour  Intake    240 ml  Output      0 ml  Net    240 ml   Vitals reviewed. General: resting in bed, NAD. HEENT: PERRL, EOMI, no scleral icterus Cardiac: RRR, no rubs, murmurs or gallops Pulm: clear to auscultation bilaterally, no wheezes, rales, or rhonchi Abd: soft, nontender, nondistended, BS present Ext: 5/5 on right extremities. Left sided spastic paresis on upper and lower exts.  Neuro: alert and oriented X4 today., cranial nerves II-XII grossly intact, strength and sensation to light touch equal in bilateral upper and lower extremities  Lab Results: Basic Metabolic Panel:  Recent Labs Lab 10/14/14 1125 10/14/14 1934 10/15/14 0639  NA 138  --  137  K 3.6*  --  3.8  CL 97  --  99  CO2 23  --  21  GLUCOSE 242*  --  145*  BUN 12  --  11  CREATININE 0.77  --  0.61  CALCIUM 9.2  --  9.1  MG  --  1.7  --    Liver Function Tests:  Recent Labs Lab 10/14/14 1125  AST 13  ALT 10  ALKPHOS 75  BILITOT 0.6  PROT 8.0  ALBUMIN 4.1   No results for input(s): LIPASE, AMYLASE in the last 168 hours. No results for input(s): AMMONIA in the last 168 hours. CBC:  Recent Labs Lab 10/14/14 1125 10/15/14 0639  WBC 15.2* 11.2*  NEUTROABS  --  7.6  HGB 13.1 12.0  HCT 40.0 36.1  MCV 84.2 82.0    PLT 251 256   Cardiac Enzymes:  Recent Labs Lab 10/14/14 1125  TROPONINI <0.30   BNP: No results for input(s): PROBNP in the last 168 hours. D-Dimer: No results for input(s): DDIMER in the last 168 hours. CBG:  Recent Labs Lab 10/14/14 1708 10/14/14 2011 10/15/14 0755 10/15/14 1202 10/15/14 1643 10/15/14 2141  GLUCAP 203* 184* 155* 192* 212* 213*   Hemoglobin A1C: No results for input(s): HGBA1C in the last 168 hours. Fasting Lipid Panel: No results for input(s): CHOL, HDL, LDLCALC, TRIG, CHOLHDL, LDLDIRECT in the last 168 hours. Thyroid Function Tests: No results for input(s): TSH, T4TOTAL, FREET4, T3FREE, THYROIDAB in the last 168 hours. Coagulation: No results for input(s): LABPROT, INR in the last 168 hours. Anemia Panel: No results for input(s): VITAMINB12, FOLATE, FERRITIN, TIBC, IRON, RETICCTPCT in the last 168 hours. Urine Drug Screen: Drugs of Abuse     Component Value Date/Time   LABOPIA NONE DETECTED 01/09/2014 2030   COCAINSCRNUR NONE DETECTED 01/09/2014 2030   LABBENZ NONE DETECTED 01/09/2014 2030   AMPHETMU NONE DETECTED 01/09/2014 2030   THCU NONE DETECTED 01/09/2014 2030   LABBARB  NONE DETECTED 01/09/2014 2030    Alcohol Level: No results for input(s): ETH in the last 168 hours. Urinalysis: No results for input(s): COLORURINE, LABSPEC, PHURINE, GLUCOSEU, HGBUR, BILIRUBINUR, KETONESUR, PROTEINUR, UROBILINOGEN, NITRITE, LEUKOCYTESUR in the last 168 hours.  Invalid input(s): APPERANCEUR Misc. Labs:  Micro Results: No results found for this or any previous visit (from the past 240 hour(s)). Studies/Results: Ct Head Wo Contrast  10/14/2014   CLINICAL DATA:  48 year old female with episode of unresponsiveness this morning, seizure-like activity. Hypertensive on presentation. Initial encounter. History of stroke with home health aide.  EXAM: CT HEAD WITHOUT CONTRAST  TECHNIQUE: Contiguous axial images were obtained from the base of the skull  through the vertex without intravenous contrast.  COMPARISON:  Head CTs without contrast 01/11/2014 and earlier.  FINDINGS: Stable and negative paranasal sinuses and mastoids. No acute osseous abnormality identified. Visualized orbits and scalp soft tissues are within normal limits.  Sequelae all of right MCA infarct with expected evolution of encephalomalacia. No midline shift, mass effect, or evidence of intracranial mass lesion. No ventriculomegaly. No acute intracranial hemorrhage identified. Stable gray-white matter differentiation elsewhere. No evidence of cortically based acute infarction identified. No suspicious intracranial vascular hyperdensity.  IMPRESSION: Chronic right MCA infarct with interval evolution of encephalomalacia. No acute intracranial abnormality.   Electronically Signed   By: Augusto Gamble M.D.   On: 10/14/2014 12:36   Medications: I have reviewed the patient's current medications. Scheduled Meds: . amLODipine  5 mg Oral Daily  . antiseptic oral rinse  7 mL Mouth Rinse BID  . aspirin  325 mg Oral Daily  . digoxin  0.125 mg Oral Daily  . enoxaparin (LOVENOX) injection  40 mg Subcutaneous Q24H  . insulin aspart  0-15 Units Subcutaneous TID WC  . levETIRAcetam  500 mg Oral BID  . metoprolol succinate  50 mg Oral Daily   Continuous Infusions:  PRN Meds:.hydrALAZINE, LORazepam Assessment/Plan: Principal Problem:   New onset seizure Active Problems:   Benign essential HTN   Type 2 diabetes mellitus, uncontrolled, with neuropathy   History of right MCA stroke   Chronic diastolic heart failure  48 yo female with hx of HTN, HLD, DM II, CAD, CHF s/p pacemaker, CVA 12/2013 with left sided residual weakness who presents with new onset seizure.   New onset seizure likely 2/2 to previous large R MCA stroke. Per neuro, this is common in people with a big stroke within a year.  Had episode of postictal confusion which has cleared and now she is back at her baseline mental  status.  Other etiology is meningitis. But no fever. WBC elevated 15 but could be from seizure. WBC improving  Ativan in the emergency room with no further seizure activity noted. Loaded with  Keppra 1000 mg IV and started on maintenance dose of 500 mg of Keppra twice a day.  - eeg shows mod slowing from R hemisphere consistent with right MCA. Neuro ok for discharge with follow up.  - continue keppra 500mg  bid (switched to PO yesterday). No longer having seizures. D/c with this. - Ativan IV 1-2 mg PRN for recurrent seizures. - outpatient neuro f/up when d/c (follow up arranged with Dr. ).  CHF - 35-40%, diastolic dysfunction - s/p pacemaker. stable.  DM II - lantus 10 am and 35 pm at home. Will continue when more alert and able to take diet. - have SSI for now. When eating, restart 50% of total lantus if eating.  HTN - normotensive. takes amlodopine 5mg ,  toprol xl 50mg  at home. Restarted. Normotensive. - hydralazine 10mg  IV q8hr >180/110.  Dispo: Disposition is deferred at this time, awaiting improvement of current medical problems.  Anticipated discharge in approximately 1-2 day(s).   The patient does have a current PCP Courtney Paris, MD) and does need an [facility] hospital follow-up appointment after discharge.  The patient does have transportation limitations that hinder transportation to clinic appointments.  .Services Needed at time of discharge: Y = Yes, Blank = No PT:   OT:   RN:   Equipment:   Other:     LOS: 2 days   Hyacinth Meeker, MD 10/16/2014, 7:24 AM

## 2014-10-16 NOTE — Plan of Care (Signed)
Problem: Phase I Progression Outcomes Goal: Voiding-avoid urinary catheter unless indicated Outcome: Completed/Met Date Met:  10/16/14     

## 2014-10-22 ENCOUNTER — Telehealth (HOSPITAL_COMMUNITY): Payer: Self-pay | Admitting: Vascular Surgery

## 2014-10-22 NOTE — Telephone Encounter (Signed)
Tele monitoring orders needed.. Please advise

## 2014-10-22 NOTE — Telephone Encounter (Signed)
Will have MD sign orders and fax back.  

## 2014-10-24 ENCOUNTER — Encounter: Payer: Self-pay | Admitting: Internal Medicine

## 2014-10-24 ENCOUNTER — Ambulatory Visit (INDEPENDENT_AMBULATORY_CARE_PROVIDER_SITE_OTHER): Payer: Medicaid Other | Admitting: Internal Medicine

## 2014-10-24 VITALS — BP 155/101 | HR 94 | Temp 98.1°F | Ht 69.0 in | Wt 265.6 lb

## 2014-10-24 DIAGNOSIS — I10 Essential (primary) hypertension: Secondary | ICD-10-CM

## 2014-10-24 DIAGNOSIS — E1140 Type 2 diabetes mellitus with diabetic neuropathy, unspecified: Secondary | ICD-10-CM

## 2014-10-24 DIAGNOSIS — E1165 Type 2 diabetes mellitus with hyperglycemia: Secondary | ICD-10-CM

## 2014-10-24 DIAGNOSIS — IMO0002 Reserved for concepts with insufficient information to code with codable children: Secondary | ICD-10-CM

## 2014-10-24 DIAGNOSIS — R569 Unspecified convulsions: Secondary | ICD-10-CM

## 2014-10-24 DIAGNOSIS — F329 Major depressive disorder, single episode, unspecified: Secondary | ICD-10-CM

## 2014-10-24 DIAGNOSIS — F32A Depression, unspecified: Secondary | ICD-10-CM

## 2014-10-24 LAB — GLUCOSE, CAPILLARY: Glucose-Capillary: 112 mg/dL — ABNORMAL HIGH (ref 70–99)

## 2014-10-24 MED ORDER — CITALOPRAM HYDROBROMIDE 20 MG PO TABS: 20.0000 mg | ORAL_TABLET | Freq: Every day | ORAL | Status: DC

## 2014-10-24 NOTE — Progress Notes (Signed)
Patient ID: [name], female   DOB: [birth year], 48 y.o.   MRN: [id number] [facility] Attending  Case discussed with Dr. Yetta Barre at the time of the visit.  We reviewed the resident's history and exam and pertinent patient test results.  I agree with the assessment, diagnosis, and plan of care documented in the resident's note.

## 2014-10-24 NOTE — Progress Notes (Signed)
Subjective:   Patient ID: [name] female   DOB: 24-Dec-1965 48 y.o.   MRN: [id number]  HPI: [name] is a 48 y.o. female w/ PMHx of HTN, HLD, uncontrolled DM type II, OSA, CAD, CHF, and h/o CVA w/ left-sided residual weakness, presents to the clinic today for hospital follow up after new onset seizures, discharged on 10/16/14. Today, patient was tearful on exam, saying that her medical conditions have become overwhelming, especially now that she had a seizure. She feels that her family contributes to her distress, even though they seem to be generally supportive. Since her discharge, she has not had any seizures since being started on Keppra 500 mg bid. No recent chest pain, SOB, new weakness, numbness or tingling.  She also mentioned that she is now having to move out of her current home because of some issue w/ her landlord and feels that she needs more support at home given her left-sided residual weakness and overwhelming number of medical conditions.   Past Medical History  Diagnosis Date  . Hypertension     Poorly controlled. Has had HTN since age 74. Angioedema with ACEI.  24 Hr urine and renal arterial dopplers ordered . . . Never done  . NICM (nonischemic cardiomyopathy)     EF 45-50% in 8/12, cath 6/12 showed normal coronaries, EF 50-55% by LV gram  . Morbid obesity   . Allergic rhinitis   . HLD (hyperlipidemia)   . OSA on CPAP     sleep study in 8/12 showed moderate to severe OSA requiring CPAP  . Chronic lower back pain     secondary to DJD, obsetiy, hip problems. Followed by Dr. Ivory Broad (pain management)  . Chronic diastolic heart failure      Primarily diastolic CHF: Likely due to uncontrolled HTN. Last echo (8/12) with EF 45-50%, mild to moderate LVH with some asymmetric septal hypertrophy, RV normal size and systolic function. EF 50-55% by LV-gram in 6/12.   . Polyneuropathy in diabetes(357.2)   . Thoracic or lumbosacral neuritis or radiculitis,  unspecified   . Calcifying tendinitis of shoulder   . LBBB (left bundle branch block)   . Chronic combined systolic and diastolic CHF (congestive heart failure)     EF 40-45% by echo 12/06/2012  . Coronary artery disease     questionable. LHC 05/2011 showing normal coronaries // Followed at [facility], Dr. Shirlee Latch  . GERD (gastroesophageal reflux disease)   . Shortness of breath   . Asthma   . Automatic implantable cardioverter-defibrillator in situ   . Stroke 12/2013    "my left side is paralyzed" (07/04/2014)  . Type II diabetes mellitus DX: 2002  . 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 Degeneration of lumbar or lumbosacral intervertebral disc   . Arthritis     "hips, back, legs, arms" (07/04/2014)  . Frequent UTI    Current Outpatient Prescriptions  Medication Sig Dispense Refill  . amLODipine (NORVASC) 5 MG tablet Take 1 tablet (5 mg total) by mouth daily. 30 tablet 3  . aspirin 325 MG tablet Take 1 tablet (325 mg total) by mouth daily. 30 tablet 5  . citalopram (CELEXA) 20 MG tablet Take 1 tablet (20 mg total) by mouth daily. 30 tablet 5  . cyclobenzaprine (FLEXERIL) 10 MG tablet Take 10 mg by mouth daily.     . diclofenac sodium (VOLTAREN) 1 % GEL Apply 2 g topically 4 (four) times  daily as needed (pain). 3 Tube 5  . digoxin (LANOXIN) 0.125 MG tablet Take 0.125 mg by mouth daily.    . fluticasone (CUTIVATE) 0.05 % cream Apply topically 2 (two) times daily. Dispense two tubes 30 g 6  . furosemide (LASIX) 40 MG tablet Take 40 mg by mouth daily as needed for fluid.    [name] glucose blood (ACCU-CHEK SMARTVIEW) test strip Use to Check Blood Sugar TID. Code: E11.40. 100 each 12  . hydrALAZINE (APRESOLINE) 100 MG tablet Take 0.5 tablets (50 mg total) by mouth 3 (three) times daily. 90 tablet 3  . Incontinence Supplies MISC 1 year supply of Pull-ups in size XL Diagnosis Code: 788.33 1 each 0  . Insulin Glargine (LANTUS SOLOSTAR) 100 UNIT/ML Solostar Pen Inject 10  units in the AM, 35 units in the PM 15 mL 11  . Insulin Pen Needle (GLOBAL EASE INJECT PEN NEEDLES) 31G X 5 MM MISC Use to inject insulin BID. Code: E 11.40 100 each 10  . isosorbide mononitrate (IMDUR) 60 MG 24 hr tablet Take 1 tablet (60 mg total) by mouth every morning. 30 tablet 11  . levETIRAcetam (KEPPRA) 500 MG tablet Take 1 tablet (500 mg total) by mouth 2 (two) times daily. 60 tablet 3  . metFORMIN (GLUCOPHAGE) 1000 MG tablet Take 1,000 mg by mouth 2 (two) times daily with a meal.    . metoprolol succinate (TOPROL-XL) 25 MG 24 hr tablet Take 50 mg (2 tablets) twice daily. 180 tablet 3  . oxyCODONE-acetaminophen (PERCOCET) 10-325 MG per tablet Take 1 tablet by mouth every 8 (eight) hours as needed for pain. 90 tablet 0  . pantoprazole (PROTONIX) 40 MG tablet Take 40 mg by mouth daily.    . potassium chloride 20 MEQ TBCR Take 20 mEq by mouth daily. 90 tablet 3  . simvastatin (ZOCOR) 40 MG tablet Take 40 mg by mouth at bedtime.     . Vitamin D, Ergocalciferol, (DRISDOL) 50000 UNITS CAPS capsule Take 1 capsule (50,000 Units total) by mouth every 7 (seven) days. 24 capsule 0   No current facility-administered medications for this visit.    Review of Systems: General: Denies fever, chills, diaphoresis, appetite change and fatigue.  Respiratory: Denies SOB, DOE, cough, chest tightness, and wheezing.  Cardiovascular: Denies chest pain and palpitations.  Gastrointestinal: Denies nausea, vomiting, abdominal pain, diarrhea, constipation, blood in stool and abdominal distention.  Genitourinary: Denies dysuria, frequency, hematuria, and flank pain.  Endocrine: Denies hot or cold intolerance, polyuria, and polydipsia.  Musculoskeletal: Positive for gait problem. Denies myalgias, back pain, joint swelling, arthralgias..  Skin: Denies pallor, rash and wounds.  Neurological: Positive for left-sided residual weakness, LUE weakness>LLE weakness. Denies dizziness, seizures, syncope, weakness,  lightheadedness, numbness and headaches.  Psychiatric/Behavioral: Denies mood changes, confusion, nervousness, sleep disturbance and agitation.  Objective:   Physical Exam: Filed Vitals:   10/24/14 1509  BP: 155/101  Pulse: 94  Temp: 98.1 F (36.7 C)  TempSrc: Oral  Height: 5\' 9"  (1.753 m)  Weight: 265 lb 9.6 oz (120.475 kg)  SpO2: 100%   General: Alert, cooperative, NAD. Tearful.  HEENT: PERRL, EOMI. Moist mucus membranes. Left-sided facial droop.  Neck: Full range of motion without pain, supple, no lymphadenopathy or carotid bruits Lungs: Clear to ascultation bilaterally, normal work of respiration, no wheezes, rales, rhonchi Heart: RRR, no murmurs, gallops, or rubs Abdomen: Soft, non-tender, non-distended, BS + Extremities: No cyanosis, clubbing, or edema. Left upper and lower extremity weakness.  Neurologic: Alert & oriented X3, cranial  nerves II-XII intact, 1/5 strength in LUE, 4/5 in LLE. Otherwise strength intact. Sensory abnormalities noted over the LUE.   Assessment & Plan:   Please see problem based assessment and plan.

## 2014-10-24 NOTE — Assessment & Plan Note (Signed)
BP Readings from Last 3 Encounters:  10/24/14 155/101  10/16/14 137/92  10/07/14 151/98    Lab Results  Component Value Date   NA 137 10/15/2014   K 3.8 10/15/2014   CREATININE 0.61 10/15/2014    Assessment: Comments: DBP elevated today, however, patient tearful on exam, states she has been stressed today d/t family issues. No recent changes in her medications.   Plan: Medications:  continue current medications; Toprol-XL 50 mg bid, Imdur 60 mg qd, Hydralazine 50 mg tid, Norvasc 5 mg qd Educational resources provided: brochure Self management tools provided: home blood pressure logbook Other plans: None

## 2014-10-24 NOTE — Assessment & Plan Note (Signed)
Patient tearful on exam today, states that her health has become overwhelming for her recently. Denies significant depressive symptoms, just feels stressed about her co-morbidities and says her family has been contributing to her stress. Says she has been sad recently, but denies hopelessness, poor sleep, change in appetite, or suicidal ideations. Has been taking Celexa 20 mg qd but says she does not take this regularly and only does so when she feels depressed. Discussed importance of compliance with this medication at length.  -Continue Celexa 20 mg DAILY. -Will discuss w/ social work w/ respect to possible ALF options? This may not be possible.  -Patient to follow up in 12/2013 for further management of chronic issues.

## 2014-10-24 NOTE — Assessment & Plan Note (Signed)
Patient w/ recent hospital admission for new onset seizure most likely related to CVA in 12/2013. Per neurology consult, stated that it is relatively common for patient's w/ large infarcts to have seizure disorder 6-12 months following insult. Patient was started on Keppra 500 mg bid in the hospital, has had no further seizures at this time.  -Continue Keppra -Follow up w/ neurology in 1-2 weeks -Discussed lifestyle modifications; stress reduction -Will investigate possible ALF placement given extensive medical conditions

## 2014-10-24 NOTE — Patient Instructions (Signed)
General Instructions: 1. You have a follow up appointment scheduled for 12/31/13  2. Please take all medications as prescribed. Continue to take Keppra 500 mg twice daily.   Continue to take Celexa daily.   3. If you have worsening of your symptoms or new symptoms arise, please call the clinic ([phone]), or go to the ER immediately if symptoms are severe.  You have done a great job in taking all your medications. I appreciate it very much. Please continue doing that.  Thank you for bringing your medicines today. This helps [place] keep you safe from mistakes.   Progress Toward Treatment Goals:  Treatment Goal 10/01/2014  Hemoglobin A1C improved  Blood pressure improved  Prevent falls at goal    Self Care Goals & Plans:  Self Care Goal 10/24/2014  Manage my medications take my medicines as prescribed; bring my medications to every visit; refill my medications on time  Monitor my health -  Eat healthy foods drink diet soda or water instead of juice or soda; eat more vegetables; eat foods that are low in salt; eat baked foods instead of fried foods; eat fruit for snacks and desserts  Be physically active -  Meeting treatment goals -    Home Blood Glucose Monitoring 10/01/2014  Check my blood sugar 3 times a day  When to check my blood sugar before breakfast; before lunch; at bedtime     Care Management & Community Referrals:  Referral 10/01/2014  Referrals made for care management support none needed  Referrals made to community resources none

## 2014-10-25 ENCOUNTER — Other Ambulatory Visit: Payer: Self-pay | Admitting: *Deleted

## 2014-10-25 ENCOUNTER — Encounter: Payer: Self-pay | Admitting: Licensed Clinical Social Worker

## 2014-10-25 ENCOUNTER — Telehealth: Payer: Self-pay | Admitting: Licensed Clinical Social Worker

## 2014-10-25 NOTE — Telephone Encounter (Signed)
[name] was referred to CSW for assistance with housing options, ALF.  CSW placed call to [name].  [name] and her 48 year old daughter live together in an apartment.  They are having landlord/tenant issues and [name] has already contacted the [place] regarding this matter.  Neither [name] nor her daughter receive housing assistance.  [name] states she applied at [facility] through [employer] but unable to provide further detail as to the status of her application.  [name] is linked with [facility] care manager, Lerry Liner, care manager is aware of [name]'s housing issue and did provide [name] with listing.  [name] currently pays $800/month for rent, [name] brings in $733/month.  Adult daughter is currently works inconsistently, with no work hours over the past two weeks.  [name] does not want ALF options. CSW able to provide [name] with listing of housing available utilizing certain parameters.  [name] agreeable and would like listing mailed to her.  CSW encouraged [name] to contact her Medicaid caseworker to inquire about housing assistance.  CSW will contact [employer] to determine if they are able to look into [name]'s [facility] application letter during their next home visit.   [name] states she will need additional assistance completing new housing applications, as she can not write.  CSW inquired if [employer] aide could help, [name] states hours are limited to 2 which are more geared towards her primary care.  CSW encouraged [name] to request daughter's assistance.  When discussing daughter's assistance, [name] states she can not depend on daughter and thus the need for her to have a power wheelchair.  [name] aware PCP hesitant at this time to maintain [name]'s level of independence.  However, [name] states she need to be able to get around independently.  CSW doubtful [name]'s insurance will cover a power wheelchair, as [name] utilized insurance in Jan 2015 for her current manual wheelchair.  [name] states she has spoken with [employer] and states she is  eligible.  [name]'s notes PCP is aware of her request and they have discussed. Inquired about filters CSW to use for housing search, [name] states daughter provides benefit to her, as they help each other.  CSW will mail [name] housing listing for [email or web address].

## 2014-10-29 ENCOUNTER — Encounter: Payer: Self-pay | Admitting: Internal Medicine

## 2014-10-30 ENCOUNTER — Encounter: Payer: Self-pay | Admitting: Neurology

## 2014-10-30 ENCOUNTER — Ambulatory Visit (INDEPENDENT_AMBULATORY_CARE_PROVIDER_SITE_OTHER): Payer: Medicaid Other | Admitting: Neurology

## 2014-10-30 VITALS — BP 146/104 | HR 98 | Temp 97.5°F | Resp 16 | Wt 264.7 lb

## 2014-10-30 DIAGNOSIS — Z8673 Personal history of transient ischemic attack (TIA), and cerebral infarction without residual deficits: Secondary | ICD-10-CM

## 2014-10-30 DIAGNOSIS — G8114 Spastic hemiplegia affecting left nondominant side: Secondary | ICD-10-CM

## 2014-10-30 DIAGNOSIS — G8110 Spastic hemiplegia affecting unspecified side: Secondary | ICD-10-CM

## 2014-10-30 DIAGNOSIS — G40209 Localization-related (focal) (partial) symptomatic epilepsy and epileptic syndromes with complex partial seizures, not intractable, without status epilepticus: Secondary | ICD-10-CM

## 2014-10-30 NOTE — Patient Instructions (Addendum)
1.  I would continue Keppra 500mg  twice daily 2 Follow up in 3 months

## 2014-10-30 NOTE — Progress Notes (Addendum)
NEUROLOGY CONSULTATION NOTE  [name] MRN: [id number] DOB: September 19, 1966  Referring provider: Dr. Hyacinth Meeker (Hospitalist) Primary care provider: Dr. 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  Records, labs and MRI personally reviewed.  She was previously followed at [facility] but patient would like to transfer care.  She was admitted to [facility] on 01/08/14 for a probable cardio-embolic (secondary to low EF) right MCA territorial infarct, presenting as left sided facial droop and left arm weakness and numbness.  CT and CTA of the head showed right MCA infarct with occluded branch MCA vessel.  CTA of the neck was negative.  She was unable to have an MRI due to pacemaker.  TpA was not given because she was out of the window.  LDL was 105 and Hgb A1c was 10.7.  She was admitted to the hospital again on 10/14/14 for witnessed seizure.  She was sitting up in bed when she suddenly fell backwards on the bed and became unresponsive, followed by generalized shaking for 3 minutes.  There was no tongue biting or incontinence.  She was postictal.  CT of the head showed chronic right MCA infarct but no acute abnormalities.  She had a WBC of 15.2, thought to be due to seizure.  EEG showed right hemispheric slowing but no epileptiform discharges.  She was started on Keppra 500mg  twice daily.  She has not had any further seizures.  She is plegic on the left side, but feels weaker since the seizure.  She requires use of a four prong cane and left boot to ambulate.  Current medications include:  ASA 325mg , Keppra 500mg   twice daily, simvastatin 40mg , metoprolol 25mg , Imdur, metformin, insulin, Percocet, hydralazine, cyclobenzaprine 10mg , furosemide, citalopram.  Most recent labs showed Hgb A1c of 7.4 in October.  HIV antibodies were negative.  PAST MEDICAL HISTORY: Past Medical History  Diagnosis Date  . Hypertension     Poorly controlled. Has had HTN since age 29. Angioedema with ACEI.  24 Hr urine and renal arterial dopplers ordered . . . Never done  . NICM (nonischemic cardiomyopathy)     EF 45-50% in 8/12, cath 6/12 showed normal coronaries, EF 50-55% by LV gram  . Morbid obesity   . Allergic rhinitis   . HLD (hyperlipidemia)   . OSA on CPAP     sleep study in 8/12 showed moderate to severe OSA requiring CPAP  . Chronic lower back pain     secondary to DJD, obsetiy, hip problems. Followed by Dr. Ivory Broad (pain management)  . Chronic diastolic heart failure      Primarily diastolic CHF: Likely due to uncontrolled HTN. Last echo (8/12) with EF 45-50%, mild to moderate LVH with some asymmetric septal hypertrophy, RV normal size and systolic function. EF 50-55% by LV-gram in 6/12.   . Polyneuropathy in diabetes(357.2)   . Thoracic or lumbosacral neuritis or radiculitis, unspecified   . Calcifying tendinitis of shoulder   . LBBB (left bundle branch block)   . Chronic combined systolic and diastolic CHF (congestive heart failure)     EF 40-45% by echo 12/06/2012  . Coronary artery disease  questionable. LHC 05/2011 showing normal coronaries // Followed at [facility], Dr. Shirlee Latch  . GERD (gastroesophageal reflux disease)   . Shortness of breath   . Asthma   . Automatic implantable cardioverter-defibrillator in situ   . Stroke 12/2013    "my left side is paralyzed" (07/04/2014)  . Type II diabetes mellitus DX: 2002  . 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 Degeneration of lumbar or lumbosacral intervertebral disc   . Arthritis     "hips, back, legs, arms" (07/04/2014)   . Frequent UTI   . Liver disease     PAST SURGICAL HISTORY: Past Surgical History  Procedure Laterality Date  . Tubal ligation  05/31/1985  . Carpal tunnel release Left   . Hemiarthroplasty shoulder fracture Right 1980's  . Multiple tooth extractions  ~ 2011    tumors removed ; "my whole top"  . Cardiac catheterization  05/2011  . Tee without cardioversion N/A 01/14/2014    Procedure: TRANSESOPHAGEAL ECHOCARDIOGRAM (TEE);  Surgeon: Thurmon Fair, MD;  Location: [facility] ENDOSCOPY;  Service: Cardiovascular;  Laterality: N/A;  . Breast surgery Bilateral 2011    patient reports benign results  . Bi-ventricular implantable cardioverter defibrillator  (crt-d)  08/2013    [name] 08/23/2013    MEDICATIONS: Current Outpatient Prescriptions on File Prior to Visit  Medication Sig Dispense Refill  . amLODipine (NORVASC) 5 MG tablet Take 1 tablet (5 mg total) by mouth daily. 30 tablet 3  . aspirin 325 MG tablet Take 1 tablet (325 mg total) by mouth daily. 30 tablet 5  . citalopram (CELEXA) 20 MG tablet Take 1 tablet (20 mg total) by mouth daily. 30 tablet 5  . cyclobenzaprine (FLEXERIL) 10 MG tablet Take 10 mg by mouth daily.     . diclofenac sodium (VOLTAREN) 1 % GEL Apply 2 g topically 4 (four) times daily as needed (pain). 3 Tube 5  . digoxin (LANOXIN) 0.125 MG tablet Take 0.125 mg by mouth daily.    . fluticasone (CUTIVATE) 0.05 % cream Apply topically 2 (two) times daily. Dispense two tubes 30 g 6  . furosemide (LASIX) 40 MG tablet Take 40 mg by mouth daily as needed for fluid.    [name] glucose blood (ACCU-CHEK SMARTVIEW) test strip Use to Check Blood Sugar TID. Code: E11.40. 100 each 12  . hydrALAZINE (APRESOLINE) 100 MG tablet Take 0.5 tablets (50 mg total) by mouth 3 (three) times daily. 90 tablet 3  . Incontinence Supplies MISC 1 year supply of Pull-ups in size XL Diagnosis Code: 788.33 1 each 0  . Insulin Glargine (LANTUS SOLOSTAR) 100 UNIT/ML Solostar Pen Inject 10 units in the AM, 35 units in  the PM 15 mL 11  . Insulin Pen Needle (GLOBAL EASE INJECT PEN NEEDLES) 31G X 5 MM MISC Use to inject insulin BID. Code: E 11.40 100 each 10  . isosorbide mononitrate (IMDUR) 60 MG 24 hr tablet Take 1 tablet (60 mg total) by mouth every morning. 30 tablet 11  . levETIRAcetam (KEPPRA) 500 MG tablet Take 1 tablet (500 mg total) by mouth 2 (two) times daily. 60 tablet 3  . metFORMIN (GLUCOPHAGE) 1000 MG tablet Take 1,000 mg by mouth 2 (two) times daily with a meal.    . metoprolol succinate (TOPROL-XL) 25 MG 24 hr tablet Take 50 mg (2 tablets) twice daily. 180 tablet 3  . oxyCODONE-acetaminophen (PERCOCET) 10-325 MG per tablet Take 1 tablet by mouth every 8 (eight) hours as needed for  pain. 90 tablet 0  . pantoprazole (PROTONIX) 40 MG tablet Take 40 mg by mouth daily.    . potassium chloride 20 MEQ TBCR Take 20 mEq by mouth daily. 90 tablet 3  . simvastatin (ZOCOR) 40 MG tablet Take 40 mg by mouth at bedtime.     . Vitamin D, Ergocalciferol, (DRISDOL) 50000 UNITS CAPS capsule Take 1 capsule (50,000 Units total) by mouth every 7 (seven) days. 24 capsule 0   No current facility-administered medications on file prior to visit.    ALLERGIES: Allergies  Allergen Reactions  . Clindamycin Anaphylaxis and Swelling  . Ace Inhibitors Swelling  . Enalapril Maleate Swelling  . Lisinopril Swelling    FAMILY HISTORY: Family History  Problem Relation Age of Onset  . Heart disease Mother 33    Died of MI at age 71 yo  . Heart disease Paternal Grandmother     requiring pacemaker.  [name] Heart disease Paternal Grandfather 66    Died of MI at possibly age 68-53yo  . Stroke Paternal Grandfather   . Heart disease Father 27    MI age 74yo requiring stenting  . Kidney disease Mother     requiring dialysis  . Congestive Heart Failure Mother   . Diabetes Father   . Diabetes Brother   . Heart disease Brother 34    MI at age 77 years old  . Breast cancer Paternal Aunt   . Breast cancer Maternal Grandmother    . Glaucoma Father     SOCIAL HISTORY: History   Social History  . Marital Status: Single    Spouse Name: N/A    Number of Children: 2  . Years of Education: 9th grade   Occupational History  . Unemployed     planning on getting disability   Social History Main Topics  . Smoking status: Never Smoker   . Smokeless tobacco: Never Used  . Alcohol Use: No  . Drug Use: No  . Sexual Activity:    Partners: Male    Birth Control/ Protection: Surgical   Other Topics Concern  . Not on file   Social History Narrative   Lives in [place] with her son. Is able to read and write fluently in [place].   REVIEW OF SYSTEMS: Constitutional: No fevers, chills, or sweats, no generalized fatigue, change in appetite Eyes: No visual changes, double vision, eye pain Ear, nose and throat: No hearing loss, ear pain, nasal congestion, sore throat Cardiovascular: No chest pain, palpitations Respiratory:  No shortness of breath at rest or with exertion, wheezes GastrointestinaI: No nausea, vomiting, diarrhea, abdominal pain, fecal incontinence Genitourinary:  No dysuria, urinary retention or frequency Musculoskeletal:  As above Integumentary: No rash, pruritus, skin lesions Neurological: as above Psychiatric: Depression Endocrine: No palpitations, fatigue, diaphoresis, mood swings, change in appetite, change in weight, increased thirst Hematologic/Lymphatic:  No anemia, purpura, petechiae. Allergic/Immunologic: no itchy/runny eyes, nasal congestion, recent allergic reactions, rashes  PHYSICAL EXAM: Filed Vitals:   10/30/14 0913  BP: 146/104  Pulse: 98  Temp: 97.5 F (36.4 C)  Resp: 16   General: No acute distress Head:  Normocephalic/atraumatic Eyes:  fundi unremarkable, without vessel changes, exudates, hemorrhages or papilledema. CN III, IV, VI:  full range of motion, no nystagmus, no ptosis Neck: supple, no paraspinal tenderness, full range of motion Back: No paraspinal  tenderness Heart: regular rate and rhythm Lungs: Clear to auscultation bilaterally. Vascular: No carotid bruits. Neurological Exam: Mental status: alert and oriented to person, place, and time, recent  and remote memory intact, fund of knowledge intact, attention and concentration intact, speech fluent and not dysarthric, language intact. Cranial nerves: CN I: not tested CN II: pupils equal, round and reactive to light, visual fields intact, fundi unremarkable, without vessel changes, exudates, hemorrhages or papilledema. CN III, IV, VI:  full range of motion, no nystagmus, no ptosis CN V: facial sensation intact CN VII: left lower facial weakness CN VIII: hearing intact CN IX, X: gag intact, uvula midline CN XI: sternocleidomastoid and trapezius muscles intact CN XII: tongue midline Bulk & Tone: increased tone on the left, no fasciculations. Motor:  Left upper and lower extremity plegia. Sensation:  Mild hyperesthesia to pinprick in the left foot and left upper extremity.  Vibration intact. Deep Tendon Reflexes:  3+ left upper and lower extremities with left Babinski, 2+ right upper and lower extremities. Finger to nose testing:  No dysmetria on the right.  Unable to test left. Gait:  Drags left leg, wears left AFO, cannot ambulate without cane.  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  Thank you for allowing me to take part in the care of this patient.  Shon Millet, DO  CC:  Donia Guiles, MD

## 2014-10-31 NOTE — Telephone Encounter (Signed)
Empty request 

## 2014-11-01 ENCOUNTER — Encounter: Payer: Medicaid Other | Attending: Physical Medicine & Rehabilitation | Admitting: Physical Medicine & Rehabilitation

## 2014-11-01 ENCOUNTER — Encounter: Payer: Self-pay | Admitting: Physical Medicine & Rehabilitation

## 2014-11-01 VITALS — BP 160/101 | HR 101 | Resp 14 | Ht 69.0 in | Wt 260.0 lb

## 2014-11-01 DIAGNOSIS — Z5181 Encounter for therapeutic drug level monitoring: Secondary | ICD-10-CM | POA: Diagnosis present

## 2014-11-01 DIAGNOSIS — Z79899 Other long term (current) drug therapy: Secondary | ICD-10-CM | POA: Insufficient documentation

## 2014-11-01 DIAGNOSIS — M47817 Spondylosis without myelopathy or radiculopathy, lumbosacral region: Secondary | ICD-10-CM | POA: Diagnosis present

## 2014-11-01 DIAGNOSIS — G8110 Spastic hemiplegia affecting unspecified side: Secondary | ICD-10-CM | POA: Insufficient documentation

## 2014-11-01 DIAGNOSIS — I5042 Chronic combined systolic (congestive) and diastolic (congestive) heart failure: Secondary | ICD-10-CM | POA: Insufficient documentation

## 2014-11-01 DIAGNOSIS — I63319 Cerebral infarction due to thrombosis of unspecified middle cerebral artery: Secondary | ICD-10-CM

## 2014-11-01 DIAGNOSIS — G629 Polyneuropathy, unspecified: Secondary | ICD-10-CM

## 2014-11-01 DIAGNOSIS — G8114 Spastic hemiplegia affecting left nondominant side: Secondary | ICD-10-CM

## 2014-11-01 DIAGNOSIS — E1142 Type 2 diabetes mellitus with diabetic polyneuropathy: Secondary | ICD-10-CM

## 2014-11-01 DIAGNOSIS — E1342 Other specified diabetes mellitus with diabetic polyneuropathy: Secondary | ICD-10-CM

## 2014-11-01 MED ORDER — OXYCODONE-ACETAMINOPHEN 10-325 MG PO TABS: 1.0000 | ORAL_TABLET | Freq: Three times a day (TID) | ORAL | Status: DC | PRN

## 2014-11-01 NOTE — Progress Notes (Signed)
Botox Injection for spasticity using needle EMG guidance Indication: spastic left sided hemiparesis (upper ext)  Dilution: 100 Units/ml        Total Units Injected: 400units Indication: Severe spasticity which interferes with ADL,mobility and/or  hygiene and is unresponsive to medication management and other conservative care Informed consent was obtained after describing risks and benefits of the procedure with the patient. This includes bleeding, bruising, infection, excessive weakness, or medication side effects. A REMS form is on file and signed.  Needle: 56mm injectable monopolar needle electrode  Number of units per muscle Pectoralis Major 0 units Pectoralis Minor 0 units Biceps 75 units Brachioradialis 100 units FCR 25 units FCU 25 units  FDS 75 units FDP 75 units FPL 25 units    All injections were done after obtaining appropriate EMG activity and after negative drawback for blood. The patient tolerated the procedure well. Post procedure instructions were given. A followup appointment was made for two months.

## 2014-11-01 NOTE — Patient Instructions (Signed)
PLEASE CALL ME WITH ANY PROBLEMS OR QUESTIONS (#[phone]).      

## 2014-11-05 ENCOUNTER — Ambulatory Visit (INDEPENDENT_AMBULATORY_CARE_PROVIDER_SITE_OTHER): Payer: Medicaid Other | Admitting: Internal Medicine

## 2014-11-05 ENCOUNTER — Encounter: Payer: Self-pay | Admitting: Internal Medicine

## 2014-11-05 VITALS — BP 150/90 | HR 111 | Temp 97.3°F | Ht 69.0 in | Wt 256.6 lb

## 2014-11-05 DIAGNOSIS — E1140 Type 2 diabetes mellitus with diabetic neuropathy, unspecified: Secondary | ICD-10-CM

## 2014-11-05 DIAGNOSIS — R350 Frequency of micturition: Secondary | ICD-10-CM

## 2014-11-05 DIAGNOSIS — B372 Candidiasis of skin and nail: Secondary | ICD-10-CM | POA: Insufficient documentation

## 2014-11-05 DIAGNOSIS — R21 Rash and other nonspecific skin eruption: Secondary | ICD-10-CM

## 2014-11-05 DIAGNOSIS — E1165 Type 2 diabetes mellitus with hyperglycemia: Secondary | ICD-10-CM

## 2014-11-05 DIAGNOSIS — IMO0002 Reserved for concepts with insufficient information to code with codable children: Secondary | ICD-10-CM

## 2014-11-05 DIAGNOSIS — G8114 Spastic hemiplegia affecting left nondominant side: Secondary | ICD-10-CM

## 2014-11-05 DIAGNOSIS — G8110 Spastic hemiplegia affecting unspecified side: Secondary | ICD-10-CM

## 2014-11-05 DIAGNOSIS — L304 Erythema intertrigo: Secondary | ICD-10-CM | POA: Insufficient documentation

## 2014-11-05 DIAGNOSIS — I10 Essential (primary) hypertension: Secondary | ICD-10-CM

## 2014-11-05 DIAGNOSIS — J069 Acute upper respiratory infection, unspecified: Secondary | ICD-10-CM | POA: Insufficient documentation

## 2014-11-05 HISTORY — DX: Frequency of micturition: R35.0

## 2014-11-05 LAB — POCT URINALYSIS DIPSTICK
Bilirubin, UA: NEGATIVE
Blood, UA: NEGATIVE
Glucose, UA: 100
Ketones, UA: NEGATIVE
PH UA: 6
Protein, UA: 30
Spec Grav, UA: 1.025
Urobilinogen, UA: NEGATIVE

## 2014-11-05 LAB — GLUCOSE, CAPILLARY: GLUCOSE-CAPILLARY: 148 mg/dL — AB (ref 70–99)

## 2014-11-05 MED ORDER — NYSTATIN 100000 UNIT/GM EX CREA: 1.0000 "application " | TOPICAL_CREAM | Freq: Two times a day (BID) | CUTANEOUS | Status: DC

## 2014-11-05 MED ORDER — FLUCONAZOLE 150 MG PO TABS: 150.0000 mg | ORAL_TABLET | ORAL | Status: DC

## 2014-11-05 NOTE — Assessment & Plan Note (Signed)
She has urinary incontinence at baseline. This could be due to yeast infection though she denies vaginal discharge or itching.  POCT urinalysis with small leuc but negative nitrite.  Pt will be treated with Diflucan for candidal infection of skin and suspect early vulvovaginal yeast infection.

## 2014-11-05 NOTE — Progress Notes (Signed)
   Subjective:    Patient ID: [name], female    DOB: Aug 08, 1966, 48 y.o.   MRN: [id number]  HPI Ms. Charpentier is a 48 year old woman with PMH of CVA with left sided hemiparesis, seizure disorder, DM2, depression, HTN, diastolic and systolic CHF, presenting for evaluation of a nonproductive cough for 4 days. Her cough was preceded by a runny nose and nasal congestion. She denies sick contacts and has had no fever/chills, but decreased appetite.  She also complains of increased urinary frequency (she has urinary incontinence at baseline and wears protection).  Since her stroke she has had spasticity of her left UE and LE, she is followed by PM&R (Dr. Riley Kill) and recently have botox injections for her UE but reports that her left leg is not as flexible as it used to be and she may need botox injections for her LE. She will see Dr. Riley Kill again in January.  Finally, she complains of a white scaly, pruritic rash under her right breast and in between her toes.  She does not have her glucometer with her today but notes that her reads have been high in the 190's range. Denies recent dietary indiscretions.   Review of Systems  Constitutional: Positive for appetite change. Negative for fever, chills, diaphoresis, fatigue and unexpected weight change.  HENT: Positive for congestion, postnasal drip and rhinorrhea. Negative for sinus pressure and sore throat.   Respiratory: Positive for cough. Negative for shortness of breath and wheezing.   Cardiovascular: Negative for chest pain, palpitations and leg swelling.  Gastrointestinal: Negative for abdominal pain.  Genitourinary: Positive for frequency.  Skin: Positive for rash.       White pruritic rash between her toes and under her right breast  Neurological: Negative for dizziness.  Psychiatric/Behavioral: Negative for agitation.       Objective:   Physical Exam  Constitutional: She is oriented to person, place, and time. She appears  well-developed and well-nourished. No distress.  HENT:  Pale nasal turbinates bilaterally   Cardiovascular: Normal rate and regular rhythm.   Pulmonary/Chest: Effort normal and breath sounds normal. No respiratory distress. She has no wheezes. She has no rales.  Musculoskeletal: She exhibits no edema.  Left hemiparesis, wearing brace at lower left extremity.    Neurological: She is alert and oriented to person, place, and time. Coordination abnormal.  Sitting in wheelchair   Skin: Skin is warm and dry. Rash noted. She is not diaphoretic. No erythema.  Scaly rash under her right breast with no pustules or surrounding erythema. Scaly white rash in between her toes with no purulent discharge, no skin break, no surrounding erythema.   Psychiatric:  Flat affect  Nursing note and vitals reviewed.         Assessment & Plan:

## 2014-11-05 NOTE — Assessment & Plan Note (Signed)
This is likely also affecting her LE, she is s/p botox injections to the left UE. She will follow up with Dr. Riley Kill in January.

## 2014-11-05 NOTE — Assessment & Plan Note (Signed)
She had nasal congestion 4 days ago with a nonproductive cough but no fever/chills with etiology likely viral v allergic.  -Pt encouraged to drink water and non sugary beverages.  -She will try OTC antihistamine such as Claritin 10mg  daily -She will try sugar-free lozenges for her cough.  -She was advised to go to the Ed or Urgent Care if she has fever/chills, increased SOB, or if her symptoms worsen.

## 2014-11-05 NOTE — Assessment & Plan Note (Signed)
Lab Results  Component Value Date   HGBA1C 7.4 10/01/2014   HGBA1C 8.3 07/03/2014   HGBA1C 12.1 03/25/2014     Assessment: Diabetes control:  Improved Progress toward A1C goal:   improved Comments: She is on Lantus 10 u in am and 35 u in the pm. No glucometer today but CBG ~140  Plan: Medications:  continue current medications Home glucose monitoring: Frequency:   Timing:   Instruction/counseling given: discussed foot care and discussed diet Educational resources provided: brochure, handout Self management tools provided:   Other plans: Follow up with her PCP in 1-2 months.

## 2014-11-05 NOTE — Patient Instructions (Signed)
General Instructions: -Start taking Diflucan 150mg , take one tablet every 72 hours for 3 doses.  -Start using Nystatin cream for the rash.  -You may take Claritin 10mg  daily to help the cough. You may also take sugar free lozenges for the cough and drinks plenty of fluids with no sugar added.  -Go to the ED if you have fever, chills, severe shortness of breath.  -Check your blood pressure at home and bring this information when you come back   Please bring your medicines with you each time you come to clinic.  Medicines may include prescription medications, over-the-counter medications, herbal remedies, eye drops, vitamins, or other pills.   Progress Toward Treatment Goals:  Treatment Goal 10/01/2014  Hemoglobin A1C improved  Blood pressure improved  Prevent falls at goal    Self Care Goals & Plans:  Self Care Goal 11/05/2014  Manage my medications take my medicines as prescribed; bring my medications to every visit; refill my medications on time; follow the sick day instructions if I am sick  Monitor my health keep track of my blood glucose; keep track of my blood pressure; keep track of my weight; check my feet daily  Eat healthy foods eat more vegetables; eat fruit for snacks and desserts; eat baked foods instead of fried foods; eat foods that are low in salt; eat smaller portions; drink diet soda or water instead of juice or soda  Be physically active find an activity I enjoy  Meeting treatment goals -    Home Blood Glucose Monitoring 10/01/2014  Check my blood sugar 3 times a day  When to check my blood sugar before breakfast; before lunch; at bedtime     Care Management & Community Referrals:  Referral 10/01/2014  Referrals made for care management support none needed  Referrals made to community resources none

## 2014-11-05 NOTE — Assessment & Plan Note (Signed)
BP Readings from Last 3 Encounters:  11/05/14 150/90  11/01/14 160/101  10/30/14 146/104    Lab Results  Component Value Date   NA 137 10/15/2014   K 3.8 10/15/2014   CREATININE 0.61 10/15/2014    Assessment: Blood pressure control:  Not controlled Progress toward BP goal:   not at goal Comments: BP mildly elevated in setting of current illness. She is on Norvasc 5mg  daily, Lasix 40mg  daily PRN, Imdur 60mg  daily, toprol XL 25mg  daily.   Plan: Medications:  continue current medications.  Educational resources provided: brochure, handout, video Self management tools provided:   Other plans: Pt will check her BP at home (she is connected with [facility]) and send this info to .

## 2014-11-05 NOTE — Assessment & Plan Note (Addendum)
She has white rash with mild erythema under her breasts and between her toes, concerning for fungal infection. She has has increased urinary frequency with no dysuria and hx of candidal vaginitis.  Will treat with diflucan 150mg  q72 hrs for 3 doses Nystatin cream as well to areas under her breasts and in between toes -- may need lamisil for her feet if does not improve

## 2014-11-06 NOTE — Progress Notes (Signed)
Internal Medicine Clinic Attending  Case discussed with Dr. Kennerly soon after the resident saw the patient.  We reviewed the resident's history and exam and pertinent patient test results.  I agree with the assessment, diagnosis, and plan of care documented in the resident's note.  

## 2014-11-19 ENCOUNTER — Telehealth: Payer: Self-pay | Admitting: *Deleted

## 2014-11-19 NOTE — Telephone Encounter (Signed)
Pt says Baclofen is not working, would like to try a different muscle reliever. Asking for a call back so she can explain it better

## 2014-11-19 NOTE — Telephone Encounter (Signed)
We did not resume the baclofen back when I saw her before botox.  (read note).  Wasn't aware that she was using it presently.  Can try zanaflex 2mg  q8 hours #90, 3rf.  Start at 2 per day for 5 days then increase to 3 per day

## 2014-11-20 ENCOUNTER — Other Ambulatory Visit: Payer: Self-pay | Admitting: *Deleted

## 2014-11-20 MED ORDER — TIZANIDINE HCL 2 MG PO CAPS: 2.0000 mg | ORAL_CAPSULE | Freq: Three times a day (TID) | ORAL | Status: DC

## 2014-11-20 NOTE — Telephone Encounter (Signed)
Per Dr. Riley Kill - try zanaflex 2mg  q8 hours #90, 3rf. Start at 2 per day for 5 days then increase to 3 per day.  Discontinue Baclofen.  Sent RX into pharmacy.

## 2014-11-20 NOTE — Telephone Encounter (Signed)
Called patient and gave her Dr's instructions - D/C baclofen and start Zanaflex.  Patient asked me to call it into [facility]  [address] [place], [place], [place] [address].  [phone]

## 2014-11-21 ENCOUNTER — Encounter (HOSPITAL_COMMUNITY): Payer: Self-pay | Admitting: Internal Medicine

## 2014-11-21 NOTE — Telephone Encounter (Signed)
Pharmacy called, they received script for the zanaflex, they also received a refill request from the patient for Baclofen. Apparently there were some unused refills still available. Pharmacy called to make sure they were not prescribing both. I informed pharmacy of Dr. Rosalyn Charters notes in which patient was not supposed to be taking Baclofen anyways and that zanaflex had been ordered. Called pt and informed her that new script would becoming in the mail and to not take Baclofen

## 2014-11-25 ENCOUNTER — Encounter: Payer: Medicaid Other | Admitting: *Deleted

## 2014-11-25 ENCOUNTER — Telehealth: Payer: Self-pay | Admitting: Cardiology

## 2014-11-25 ENCOUNTER — Other Ambulatory Visit: Payer: Self-pay | Admitting: *Deleted

## 2014-11-25 MED ORDER — TIZANIDINE HCL 2 MG PO CAPS: 2.0000 mg | ORAL_CAPSULE | Freq: Three times a day (TID) | ORAL | Status: DC

## 2014-11-25 NOTE — Telephone Encounter (Signed)
Patient called about refill.  Called into [facility] [address]  [phone]

## 2014-11-25 NOTE — Telephone Encounter (Signed)
Confirmed remote transmission w/ pt. Pt will send transmission when she unpacks from her move.

## 2014-11-25 NOTE — Telephone Encounter (Signed)
recvd fax request for Baclofen 10mg   - sent in electronically

## 2014-11-27 NOTE — Telephone Encounter (Signed)
Seen in clinic 10/24/14 Dr. Yetta Barre.

## 2014-11-29 ENCOUNTER — Encounter: Payer: Self-pay | Admitting: Cardiology

## 2014-11-29 ENCOUNTER — Other Ambulatory Visit: Payer: Self-pay | Admitting: *Deleted

## 2014-11-29 DIAGNOSIS — G8114 Spastic hemiplegia affecting left nondominant side: Secondary | ICD-10-CM

## 2014-11-29 DIAGNOSIS — R21 Rash and other nonspecific skin eruption: Secondary | ICD-10-CM

## 2014-11-29 DIAGNOSIS — E1142 Type 2 diabetes mellitus with diabetic polyneuropathy: Secondary | ICD-10-CM

## 2014-11-29 DIAGNOSIS — I63319 Cerebral infarction due to thrombosis of unspecified middle cerebral artery: Secondary | ICD-10-CM

## 2014-11-29 DIAGNOSIS — I5042 Chronic combined systolic (congestive) and diastolic (congestive) heart failure: Secondary | ICD-10-CM

## 2014-11-29 MED ORDER — OXYCODONE-ACETAMINOPHEN 10-325 MG PO TABS: 1.0000 | ORAL_TABLET | Freq: Three times a day (TID) | ORAL | Status: DC | PRN

## 2014-11-29 NOTE — Telephone Encounter (Signed)
Patient came in without checking to see if RX for oxycodone was ready... Already called in the Flexeril to [facility].  Will have to get Dr. Wynn Banker to sign, as he is the only provider in the office this afternoon

## 2014-11-30 ENCOUNTER — Other Ambulatory Visit (HOSPITAL_COMMUNITY): Payer: Self-pay | Admitting: Cardiology

## 2014-11-30 MED ORDER — NYSTATIN 100000 UNIT/GM EX CREA: 1.0000 "application " | TOPICAL_CREAM | Freq: Two times a day (BID) | CUTANEOUS | Status: DC

## 2014-12-02 ENCOUNTER — Other Ambulatory Visit: Payer: Self-pay | Admitting: *Deleted

## 2014-12-02 DIAGNOSIS — IMO0002 Reserved for concepts with insufficient information to code with codable children: Secondary | ICD-10-CM

## 2014-12-02 DIAGNOSIS — E1140 Type 2 diabetes mellitus with diabetic neuropathy, unspecified: Secondary | ICD-10-CM

## 2014-12-02 DIAGNOSIS — E1165 Type 2 diabetes mellitus with hyperglycemia: Principal | ICD-10-CM

## 2014-12-02 MED ORDER — ASPIRIN 325 MG PO TABS: 325.0000 mg | ORAL_TABLET | Freq: Every day | ORAL | Status: DC

## 2014-12-02 NOTE — Telephone Encounter (Signed)
Fax rec'd, Rx sent in electronically, pt notified

## 2014-12-11 ENCOUNTER — Telehealth (HOSPITAL_COMMUNITY): Payer: Self-pay | Admitting: Vascular Surgery

## 2014-12-11 NOTE — Telephone Encounter (Signed)
Multiple faxes received for refill of spiro Faxes returned indicating pt is no longer this medication Confirmed WITH PT SHE IS not CURRENTLY TAKING SPIRO AND DOES NOT HAVE BOTTLE IN THE HOME Verbal order to d/c rx with [facility] given to [facility]

## 2014-12-11 NOTE — Telephone Encounter (Signed)
[facility] called to see if a refill request for Spiralactone 25 mg was received via Fax.. Please advise

## 2014-12-16 ENCOUNTER — Ambulatory Visit: Payer: Medicaid Other | Admitting: Internal Medicine

## 2014-12-19 ENCOUNTER — Other Ambulatory Visit: Payer: Self-pay | Admitting: *Deleted

## 2014-12-19 MED ORDER — AMLODIPINE BESYLATE 5 MG PO TABS: 5.0000 mg | ORAL_TABLET | Freq: Every day | ORAL | Status: DC

## 2014-12-20 ENCOUNTER — Telehealth: Payer: Self-pay | Admitting: *Deleted

## 2014-12-20 NOTE — Telephone Encounter (Signed)
Call from [place], nurse with [facility] - [phone]  Pt told Nurse that she can not get her glucometer to work.  Nurse is asking for a new meter.  I called pt back and asked her to call the # on the back of her meter.  They will trouble shoot with her and may send a new meter.  If that does not work I asked her to call Monday and we will order another Meter. Nurse has been informed

## 2014-12-31 ENCOUNTER — Encounter: Payer: Self-pay | Admitting: Internal Medicine

## 2014-12-31 ENCOUNTER — Encounter: Payer: Medicaid Other | Admitting: Internal Medicine

## 2015-01-01 ENCOUNTER — Encounter: Payer: Self-pay | Admitting: Physical Medicine & Rehabilitation

## 2015-01-01 ENCOUNTER — Other Ambulatory Visit: Payer: Self-pay | Admitting: Physical Medicine & Rehabilitation

## 2015-01-01 ENCOUNTER — Encounter: Payer: Medicaid Other | Attending: Physical Medicine & Rehabilitation | Admitting: Physical Medicine & Rehabilitation

## 2015-01-01 VITALS — BP 162/105 | HR 102 | Resp 14

## 2015-01-01 DIAGNOSIS — E1142 Type 2 diabetes mellitus with diabetic polyneuropathy: Secondary | ICD-10-CM

## 2015-01-01 DIAGNOSIS — Z5181 Encounter for therapeutic drug level monitoring: Secondary | ICD-10-CM | POA: Diagnosis present

## 2015-01-01 DIAGNOSIS — G8114 Spastic hemiplegia affecting left nondominant side: Secondary | ICD-10-CM

## 2015-01-01 DIAGNOSIS — G8110 Spastic hemiplegia affecting unspecified side: Secondary | ICD-10-CM | POA: Insufficient documentation

## 2015-01-01 DIAGNOSIS — M1712 Unilateral primary osteoarthritis, left knee: Secondary | ICD-10-CM

## 2015-01-01 DIAGNOSIS — G629 Polyneuropathy, unspecified: Secondary | ICD-10-CM | POA: Diagnosis present

## 2015-01-01 DIAGNOSIS — I5042 Chronic combined systolic (congestive) and diastolic (congestive) heart failure: Secondary | ICD-10-CM | POA: Diagnosis present

## 2015-01-01 DIAGNOSIS — Z79899 Other long term (current) drug therapy: Secondary | ICD-10-CM | POA: Diagnosis present

## 2015-01-01 DIAGNOSIS — E1342 Other specified diabetes mellitus with diabetic polyneuropathy: Secondary | ICD-10-CM | POA: Insufficient documentation

## 2015-01-01 DIAGNOSIS — M47817 Spondylosis without myelopathy or radiculopathy, lumbosacral region: Secondary | ICD-10-CM | POA: Insufficient documentation

## 2015-01-01 DIAGNOSIS — I63319 Cerebral infarction due to thrombosis of unspecified middle cerebral artery: Secondary | ICD-10-CM | POA: Insufficient documentation

## 2015-01-01 DIAGNOSIS — Z8673 Personal history of transient ischemic attack (TIA), and cerebral infarction without residual deficits: Secondary | ICD-10-CM

## 2015-01-01 MED ORDER — OXYCODONE-ACETAMINOPHEN 10-325 MG PO TABS: 1.0000 | ORAL_TABLET | Freq: Three times a day (TID) | ORAL | Status: DC | PRN

## 2015-01-01 NOTE — Patient Instructions (Signed)
AQUATICS WOULD BE GOOD FOR STRENGHT BUILD UP   WORK ON CONTINUED GOOD POSTURE AND WALKING TECHNIQUE.   BUILD UP STRENGTH IN QUADRICEPS MUSCLES

## 2015-01-01 NOTE — Progress Notes (Signed)
Subjective:    Patient ID: [name], female    DOB: 27-Feb-1966, 49 y.o.   MRN: [id number]  HPI   [name] is back regarding her right CVA and chronic pain, left hemiparesis. She had great results with botox to her left wrist and fingers. She is using her splint at home. She is working on her walking. She would like to get started with aquatic activities at the [facility] soon  She is having some more left sided knee pain. She is using the voltaren gel which helps. She continues on her percocet also.   [name] reports no new issues from a cardiac standpoint. She maintains close follow up with cardiology.    Pain Inventory Average Pain 9 Pain Right Now 8 My pain is intermittent, tingling and aching  In the last 24 hours, has pain interfered with the following? General activity 0 Relation with others 0 Enjoyment of life 0 What TIME of day is your pain at its worst? night Sleep (in general) Good  Pain is worse with: walking and sitting Pain improves with: medication Relief from Meds: 7  Mobility walk with assistance use a cane  Function disabled: date disabled . I need assistance with the following:  dressing, bathing, meal prep, household duties and shopping  Neuro/Psych bladder control problems trouble walking spasms  Prior Studies Any changes since last visit?  no  Physicians involved in your care Any changes since last visit?  no   Family History  Problem Relation Age of Onset  . Heart disease Mother 96    Died of MI at age 62 yo  . Heart disease Paternal Grandmother     requiring pacemaker.  [name] Heart disease Paternal Grandfather 75    Died of MI at possibly age 9-53yo  . Stroke Paternal Grandfather   . Heart disease Father 59    MI age 26yo requiring stenting  . Kidney disease Mother     requiring dialysis  . Congestive Heart Failure Mother   . Diabetes Father   . Diabetes Brother   . Heart disease Brother 73    MI at age 48 years old  . Breast  cancer Paternal Aunt   . Breast cancer Maternal Grandmother   . Glaucoma Father    History   Social History  . Marital Status: Single    Spouse Name: N/A    Number of Children: 2  . Years of Education: 9th grade   Occupational History  . Unemployed     planning on getting disability   Social History Main Topics  . Smoking status: Never Smoker   . Smokeless tobacco: Never Used  . Alcohol Use: No  . Drug Use: No  . Sexual Activity:    Partners: Male    Birth Control/ Protection: Surgical   Other Topics Concern  . None   Social History Narrative   Lives in [place] with her son. Is able to read and write fluently in [place].   Past Surgical History  Procedure Laterality Date  . Tubal ligation  05/31/1985  . Carpal tunnel release Left   . Hemiarthroplasty shoulder fracture Right 1980's  . Multiple tooth extractions  ~ 2011    tumors removed ; "my whole top"  . Cardiac catheterization  05/2011  . Tee without cardioversion N/A 01/14/2014    Procedure: TRANSESOPHAGEAL ECHOCARDIOGRAM (TEE);  Surgeon: Thurmon Fair, MD;  Location: [facility] ENDOSCOPY;  Service: Cardiovascular;  Laterality: N/A;  . Breast surgery Bilateral 2011  patient reports benign results  . Bi-ventricular implantable cardioverter defibrillator  (crt-d)  08/2013    [name] 08/23/2013  . Bi-ventricular implantable cardioverter defibrillator N/A 08/22/2013    Procedure: BI-VENTRICULAR IMPLANTABLE CARDIOVERTER DEFIBRILLATOR  (CRT-D);  Surgeon: [name], MD;  Location: [facility] CATH LAB;  Service: Cardiovascular;  Laterality: N/A;   Past Medical History  Diagnosis Date  . Hypertension     Poorly controlled. Has had HTN since age 36. Angioedema with ACEI.  24 Hr urine and renal arterial dopplers ordered . . . Never done  . NICM (nonischemic cardiomyopathy)     EF 45-50% in 8/12, cath 6/12 showed normal coronaries, EF 50-55% by LV gram  . Morbid obesity   . Allergic rhinitis   . HLD (hyperlipidemia)   . OSA on  CPAP     sleep study in 8/12 showed moderate to severe OSA requiring CPAP  . Chronic lower back pain     secondary to DJD, obsetiy, hip problems. Followed by Dr. Ivory Broad (pain management)  . Chronic diastolic heart failure      Primarily diastolic CHF: Likely due to uncontrolled HTN. Last echo (8/12) with EF 45-50%, mild to moderate LVH with some asymmetric septal hypertrophy, RV normal size and systolic function. EF 50-55% by LV-gram in 6/12.   . Polyneuropathy in diabetes(357.2)   . Thoracic or lumbosacral neuritis or radiculitis, unspecified   . Calcifying tendinitis of shoulder   . LBBB (left bundle branch block)   . Chronic combined systolic and diastolic CHF (congestive heart failure)     EF 40-45% by echo 12/06/2012  . Coronary artery disease     questionable. LHC 05/2011 showing normal coronaries // Followed at [facility], Dr. Shirlee Latch  . GERD (gastroesophageal reflux disease)   . Shortness of breath   . Asthma   . Automatic implantable cardioverter-defibrillator in situ   . Stroke 12/2013    "my left side is paralyzed" (07/04/2014)  . Type II diabetes mellitus DX: 2002  . 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 Degeneration of lumbar or lumbosacral intervertebral disc   . Arthritis     "hips, back, legs, arms" (07/04/2014)  . Frequent UTI   . Liver disease    BP 162/105 mmHg  Pulse 102  Resp 14  SpO2 100%  Opioid Risk Score:   Fall Risk Score: Moderate Fall Risk (6-13 points) (previously educated and given handout)  Review of Systems  Genitourinary:       Bladder control problems  Musculoskeletal: Positive for gait problem.       Spasms  All other systems reviewed and are negative.      Objective:   Physical Exam  Constitutional: She is oriented to person, place, and time. She appears well-developed and well-nourished.  Obese --no change  HENT:  Head: Normocephalic and atraumatic.  Nose: Nose normal.  Eyes: Conjunctivae and  EOM are normal. Pupils are equal, round, and reactive to light.  Neck: Normal range of motion.  Cardiovascular: Normal rate and regular rhythm. Defib/pacer left upper chest wall, site clean  Pulmonary/Chest: Effort normal and breath sounds normal.  Abdominal: She exhibits no distension. There is no tenderness.  Musculoskeletal: Patient had pain with palpation over the righ tlower lumbar paraspinals and upper pelvis. Hip was non-tender. Transferring was improved.  Trace to 1+ edema left lower ext. 1+ LUE.    Mild joint line tenderness bilaterally. She has bilateral valgus deformities at each knee, right  more than left. she works hard to control the left knee and does a better job avoiding recurvatum.   Neurological: She is alert and oriented to person, place, and time. Left upper trace at bicep and deltoid. 0/5 elsewhere in UE. Trace movement proximal LLE. Tone 3/4 bicep trace at pec and 1-2/4 wrist/fingers, 1+ gastroc, TP, tib anterior trace. DTR's 3+ on left. Left central 7 and tongue deviation. Speech minimally dysarthric. Intelligence normal. sensory deficit (stocking glove both legs) is present.  Psychiatric: She has a normal mood and affect. Her behavior is normal. Judgment and thought content normal.   Assessment & Plan:   ASSESSMENT:  1. Chronic low back pain which is multifactorial. Recent MRI of the  back is notable for disc disease and facet hypertrophy. Most  involved level appeared to be L4-L5, where there is a shallow broad-  based disk herniation and facet arthropathy with mild stenosis.  2. Diabetic peripheral neuropathy.  3. Bilateral rotator cuff syndrome.  4. 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 Pain in left knee likely due to recurrent recurvatum.  5. Cardiomyopathy/HTN. It is quite obvious that she's feeling better given her weight loss and pacer.  6. 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  7.  Morbid obesity    PLAN:  1. Continue with ROM LUE. Needs to wear resting splint each night.  2. Percocet 10/325 was refilled one q8 prn. #90 for 08/13/14.  3. Weight loss.   4. Reviewed gait technique and posture. She is actually doing a good job. 5. Cardiac mgt per Dr. Jones Broom  6. Continue Voltaren gel for knees/ hands. Consider injection.  7. Further AFO adjustment per [facility]. Still would probably benefit from Kick plate still for left shoe.  8. I will see her back in about a month for said injections. 30 minutes of face to face patient care time were spent during this visit. All questions were encouraged and answered.

## 2015-01-02 LAB — PMP ALCOHOL METABOLITE (ETG): Ethyl Glucuronide (EtG): NEGATIVE ng/mL

## 2015-01-03 LAB — PRESCRIPTION MONITORING PROFILE (SOLSTAS)
Amphetamine/Meth: NEGATIVE ng/mL
Barbiturate Screen, Urine: NEGATIVE ng/mL
Benzodiazepine Screen, Urine: NEGATIVE ng/mL
Buprenorphine, Urine: NEGATIVE ng/mL
CANNABINOID SCRN UR: NEGATIVE ng/mL
COCAINE METABOLITES: NEGATIVE ng/mL
Carisoprodol, Urine: NEGATIVE ng/mL
Creatinine, Urine: 194.04 mg/dL (ref >20.0)
ECSTASY: NEGATIVE ng/mL
Fentanyl, Ur: NEGATIVE ng/mL
METHADONE SCREEN, URINE: NEGATIVE ng/mL
Meperidine, Ur: NEGATIVE ng/mL
NITRITES URINE, INITIAL: NEGATIVE ug/mL
OPIATE SCREEN, URINE: NEGATIVE ng/mL
Oxycodone Screen, Ur: NEGATIVE ng/mL
PH URINE, INITIAL: 5.1 pH (ref 4.5–8.9)
PROPOXYPHENE: NEGATIVE ng/mL
TAPENTADOLUR: NEGATIVE ng/mL
Tramadol Scrn, Ur: NEGATIVE ng/mL
Zolpidem, Urine: NEGATIVE ng/mL

## 2015-01-07 ENCOUNTER — Encounter (HOSPITAL_COMMUNITY): Payer: Self-pay | Admitting: *Deleted

## 2015-01-07 ENCOUNTER — Emergency Department (HOSPITAL_COMMUNITY)
Admission: EM | Admit: 2015-01-07 | Discharge: 2015-01-07 | Disposition: A | Discharge status: Home / Self Care | Payer: Medicaid Other | Source: Home / Self Care | Attending: Family Medicine | Admitting: Family Medicine

## 2015-01-07 DIAGNOSIS — J069 Acute upper respiratory infection, unspecified: Secondary | ICD-10-CM

## 2015-01-07 DIAGNOSIS — M62838 Other muscle spasm: Secondary | ICD-10-CM

## 2015-01-07 MED ORDER — IPRATROPIUM BROMIDE 0.06 % NA SOLN: 2.0000 | Freq: Four times a day (QID) | NASAL | Status: DC

## 2015-01-07 MED ORDER — CYCLOBENZAPRINE HCL 5 MG PO TABS: 5.0000 mg | ORAL_TABLET | Freq: Three times a day (TID) | ORAL | Status: DC | PRN

## 2015-01-07 NOTE — ED Notes (Signed)
Pt  Reports   Symptoms    Of  Cough  /  Congested   Stuffy nose/   Sinus  Congestion   With  Onset      X  2  Days     Pt has  A  History  Of    CVA      And has    Pain  /  Muscle  Spasm           l  Leg   - denys          Any  Recent  Injury

## 2015-01-07 NOTE — ED Provider Notes (Signed)
CSN: [id number]     Arrival date & time 01/07/15  1321 History   First MD Initiated Contact with Patient 01/07/15 1518     Chief Complaint  Patient presents with  . URI   (Consider location/radiation/quality/duration/timing/severity/associated sxs/prior Treatment) Patient is a 49 y.o. female presenting with URI. The history is provided by the patient.  URI Presenting symptoms: congestion and rhinorrhea   Presenting symptoms: no cough and no fever   Severity:  Mild Onset quality:  Gradual Duration:  4 days Chronicity:  New Relieved by:  None tried Worsened by:  Nothing tried Associated symptoms: myalgias   Risk factors: no recent illness and no sick contacts   Risk factors comment:  Had flu vacc this season. s/p cva, having left leg spasms.   Past Medical History  Diagnosis Date  . Hypertension     Poorly controlled. Has had HTN since age 45. Angioedema with ACEI.  24 Hr urine and renal arterial dopplers ordered . . . Never done  . NICM (nonischemic cardiomyopathy)     EF 45-50% in 8/12, cath 6/12 showed normal coronaries, EF 50-55% by LV gram  . Morbid obesity   . Allergic rhinitis   . HLD (hyperlipidemia)   . OSA on CPAP     sleep study in 8/12 showed moderate to severe OSA requiring CPAP  . Chronic lower back pain     secondary to DJD, obsetiy, hip problems. Followed by Dr. Ivory Broad (pain management)  . Chronic diastolic heart failure      Primarily diastolic CHF: Likely due to uncontrolled HTN. Last echo (8/12) with EF 45-50%, mild to moderate LVH with some asymmetric septal hypertrophy, RV normal size and systolic function. EF 50-55% by LV-gram in 6/12.   . Polyneuropathy in diabetes(357.2)   . Thoracic or lumbosacral neuritis or radiculitis, unspecified   . Calcifying tendinitis of shoulder   . LBBB (left bundle branch block)   . Chronic combined systolic and diastolic CHF (congestive heart failure)     EF 40-45% by echo 12/06/2012  . Coronary artery disease    questionable. LHC 05/2011 showing normal coronaries // Followed at [facility], Dr. Shirlee Latch  . GERD (gastroesophageal reflux disease)   . Shortness of breath   . Asthma   . Automatic implantable cardioverter-defibrillator in situ   . Stroke 12/2013    "my left side is paralyzed" (07/04/2014)  . Type II diabetes mellitus DX: 2002  . 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 Degeneration of lumbar or lumbosacral intervertebral disc   . Arthritis     "hips, back, legs, arms" (07/04/2014)  . Frequent UTI   . Liver disease    Past Surgical History  Procedure Laterality Date  . Tubal ligation  05/31/1985  . Carpal tunnel release Left   . Hemiarthroplasty shoulder fracture Right 1980's  . Multiple tooth extractions  ~ 2011    tumors removed ; "my whole top"  . Cardiac catheterization  05/2011  . Tee without cardioversion N/A 01/14/2014    Procedure: TRANSESOPHAGEAL ECHOCARDIOGRAM (TEE);  Surgeon: Thurmon Fair, MD;  Location: [facility] ENDOSCOPY;  Service: Cardiovascular;  Laterality: N/A;  . Breast surgery Bilateral 2011    patient reports benign results  . Bi-ventricular implantable cardioverter defibrillator  (crt-d)  08/2013    [name] 08/23/2013  . Bi-ventricular implantable cardioverter defibrillator N/A 08/22/2013    Procedure: BI-VENTRICULAR IMPLANTABLE CARDIOVERTER DEFIBRILLATOR  (CRT-D);  Surgeon: [name], MD;  Location: [facility]  CATH LAB;  Service: Cardiovascular;  Laterality: N/A;   Family History  Problem Relation Age of Onset  . Heart disease Mother 73    Died of MI at age 34 yo  . Heart disease Paternal Grandmother     requiring pacemaker.  [name] Heart disease Paternal Grandfather 54    Died of MI at possibly age 74-53yo  . Stroke Paternal Grandfather   . Heart disease Father 15    MI age 24yo requiring stenting  . Kidney disease Mother     requiring dialysis  . Congestive Heart Failure Mother   . Diabetes Father   . Diabetes Brother   . Heart disease  Brother 74    MI at age 83 years old  . Breast cancer Paternal Aunt   . Breast cancer Maternal Grandmother   . Glaucoma Father    History  Substance Use Topics  . Smoking status: Never Smoker   . Smokeless tobacco: Never Used  . Alcohol Use: No   OB History    No data available     Review of Systems  Constitutional: Negative.  Negative for fever, chills and appetite change.  HENT: Positive for congestion, postnasal drip and rhinorrhea.   Respiratory: Negative for cough.   Gastrointestinal: Negative.   Musculoskeletal: Positive for myalgias.  Skin: Negative.     Allergies  Clindamycin; Ace inhibitors; Enalapril maleate; and Lisinopril  Home Medications   Prior to Admission medications   Medication Sig Start Date End Date Taking? Authorizing Provider  amLODipine (NORVASC) 5 MG tablet Take 1 tablet (5 mg total) by mouth daily. 12/19/14   [name], MD  aspirin 325 MG tablet Take 1 tablet (325 mg total) by mouth daily. 12/02/14   Ranelle Oyster, MD  citalopram (CELEXA) 20 MG tablet Take 1 tablet (20 mg total) by mouth daily. 10/24/14 10/22/15  Courtney Paris, MD  cyclobenzaprine (FLEXERIL) 5 MG tablet Take 1 tablet (5 mg total) by mouth 3 (three) times daily as needed for muscle spasms. 01/07/15   Linna Hoff, MD  diclofenac sodium (VOLTAREN) 1 % GEL Apply 2 g topically 4 (four) times daily as needed (pain). 08/29/14   Ranelle Oyster, MD  digoxin (LANOXIN) 0.125 MG tablet Take 0.125 mg by mouth daily.    Historical Provider, MD  fluconazole (DIFLUCAN) 150 MG tablet Take 1 tablet (150 mg total) by mouth every 3 (three) days. 11/05/14   Ky Barban, MD  fluticasone (CUTIVATE) 0.05 % cream Apply topically 2 (two) times daily. Dispense two tubes 07/11/14   Jacalyn Lefevre, NP  furosemide (LASIX) 40 MG tablet Take 40 mg by mouth daily as needed for fluid.    Historical Provider, MD  glucose blood (ACCU-CHEK SMARTVIEW) test strip Use to Check Blood Sugar TID. Code: E11.40.  09/30/14   Courtney Paris, MD  hydrALAZINE (APRESOLINE) 100 MG tablet Take 0.5 tablets (50 mg total) by mouth 3 (three) times daily. 10/07/14   Sherald Hess, NP  Incontinence Supplies MISC 1 year supply of Pull-ups in size XL Diagnosis Code: 788.33 08/27/14   Courtney Paris, MD  Insulin Glargine (LANTUS SOLOSTAR) 100 UNIT/ML Solostar Pen Inject 10 units in the AM, 35 units in the PM 10/01/14   Courtney Paris, MD  Insulin Pen Needle (GLOBAL EASE INJECT PEN NEEDLES) 31G X 5 MM MISC Use to inject insulin BID. Code: E 11.40 09/30/14   Courtney Paris, MD  ipratropium (ATROVENT) 0.06 % nasal spray Place  2 sprays into both nostrils 4 (four) times daily. 01/07/15   Linna Hoff, MD  isosorbide mononitrate (IMDUR) 60 MG 24 hr tablet Take 1 tablet (60 mg total) by mouth every morning. 08/21/14 08/21/15  Courtney Paris, MD  levETIRAcetam (KEPPRA) 500 MG tablet Take 1 tablet (500 mg total) by mouth 2 (two) times daily. 10/16/14   Hyacinth Meeker, MD  metFORMIN (GLUCOPHAGE) 1000 MG tablet Take 1,000 mg by mouth 2 (two) times daily with a meal. 04/12/14 04/12/15  Courtney Paris, MD  metoprolol succinate (TOPROL-XL) 25 MG 24 hr tablet Take 50 mg (2 tablets) twice daily. 08/21/14   [name], MD  nystatin cream (MYCOSTATIN) Apply 1 application topically 2 (two) times daily. 11/30/14   Courtney Paris, MD  oxyCODONE-acetaminophen (PERCOCET) 10-325 MG per tablet Take 1 tablet by mouth every 8 (eight) hours as needed for pain. 01/01/15   Ranelle Oyster, MD  pantoprazole (PROTONIX) 40 MG tablet Take 40 mg by mouth daily.    Historical Provider, MD  potassium chloride 20 MEQ TBCR Take 20 mEq by mouth daily. 08/07/14   Aundria Rud, NP  simvastatin (ZOCOR) 40 MG tablet Take 40 mg by mouth at bedtime.     Historical Provider, MD  tizanidine (ZANAFLEX) 2 MG capsule Take 1 capsule (2 mg total) by mouth 3 (three) times daily. 11/25/14   Ranelle Oyster, MD  Vitamin D, Ergocalciferol, (DRISDOL) 50000 UNITS CAPS capsule Take 1 capsule (50,000 Units  total) by mouth every 7 (seven) days. 09/02/14   Courtney Paris, MD   BP 126/84 mmHg  Pulse 98  Temp(Src) 98.4 F (36.9 C) (Oral)  Resp 16  LMP 12/13/2014 Physical Exam  Constitutional: She is oriented to person, place, and time. She appears well-developed and well-nourished.  HENT:  Right Ear: External ear normal.  Left Ear: External ear normal.  Mouth/Throat: Oropharynx is clear and moist.  Neck: Normal range of motion. Neck supple.  Pulmonary/Chest: Effort normal and breath sounds normal.  Neurological: She is alert and oriented to person, place, and time.  Skin: Skin is warm and dry.  Nursing note and vitals reviewed.   ED Course  Procedures (including critical care time) Labs Review Labs Reviewed - No data to display  Imaging Review No results found.   MDM   1. URI (upper respiratory infection)   2. Muscle spasm of both lower legs       Linna Hoff, MD 01/07/15 [phone]

## 2015-01-13 ENCOUNTER — Telehealth: Payer: Self-pay | Admitting: Internal Medicine

## 2015-01-13 NOTE — Telephone Encounter (Signed)
Call to patient to confirm appointment for 01/14/15 at 2:15. Patient confirmed.

## 2015-01-14 ENCOUNTER — Ambulatory Visit (INDEPENDENT_AMBULATORY_CARE_PROVIDER_SITE_OTHER): Payer: Medicaid Other | Admitting: Internal Medicine

## 2015-01-14 ENCOUNTER — Ambulatory Visit: Payer: Medicaid Other | Admitting: Nurse Practitioner

## 2015-01-14 ENCOUNTER — Encounter: Payer: Self-pay | Admitting: Internal Medicine

## 2015-01-14 VITALS — BP 141/90 | HR 88 | Temp 97.7°F | Ht 69.0 in | Wt 254.1 lb

## 2015-01-14 DIAGNOSIS — E1149 Type 2 diabetes mellitus with other diabetic neurological complication: Secondary | ICD-10-CM

## 2015-01-14 DIAGNOSIS — I10 Essential (primary) hypertension: Secondary | ICD-10-CM

## 2015-01-14 DIAGNOSIS — I639 Cerebral infarction, unspecified: Secondary | ICD-10-CM

## 2015-01-14 DIAGNOSIS — E1140 Type 2 diabetes mellitus with diabetic neuropathy, unspecified: Secondary | ICD-10-CM

## 2015-01-14 DIAGNOSIS — E785 Hyperlipidemia, unspecified: Secondary | ICD-10-CM

## 2015-01-14 DIAGNOSIS — E1165 Type 2 diabetes mellitus with hyperglycemia: Secondary | ICD-10-CM

## 2015-01-14 DIAGNOSIS — IMO0002 Reserved for concepts with insufficient information to code with codable children: Secondary | ICD-10-CM

## 2015-01-14 LAB — LIPID PANEL
CHOLESTEROL: 174 mg/dL (ref 0–200)
HDL: 43 mg/dL (ref >39)
LDL Cholesterol: 106 mg/dL — ABNORMAL HIGH (ref 0–99)
TRIGLYCERIDES: 124 mg/dL (ref <150)
Total CHOL/HDL Ratio: 4 Ratio
VLDL: 25 mg/dL (ref 0–40)

## 2015-01-14 LAB — GLUCOSE, CAPILLARY: Glucose-Capillary: 110 mg/dL — ABNORMAL HIGH (ref 70–99)

## 2015-01-14 LAB — POCT GLYCOSYLATED HEMOGLOBIN (HGB A1C): Hemoglobin A1C: 7.4

## 2015-01-14 MED ORDER — ASPIRIN 325 MG PO TABS: 325.0000 mg | ORAL_TABLET | Freq: Every day | ORAL | Status: DC

## 2015-01-14 MED ORDER — ACCU-CHEK AVIVA PLUS W/DEVICE KIT: 1.0000 | PACK | Freq: Three times a day (TID) | Status: DC

## 2015-01-14 NOTE — Progress Notes (Signed)
Urine drug screen for this encounter is inconsistent for prescribed medication, reported taking oxycodone 01/01/15 (test date) no med or metabolite present

## 2015-01-14 NOTE — Assessment & Plan Note (Addendum)
Lab Results  Component Value Date   HGBA1C 7.4 01/14/2015   HGBA1C 7.4 10/01/2014   HGBA1C 8.3 07/03/2014     Assessment: Diabetes control: good control (HgbA1C at goal) Progress toward A1C goal:  unchanged Comments: HbA1c unchanged from previous visit.   Plan: Medications:  continue current medications;  Home glucose monitoring: Frequency: 2 times a day Timing: before breakfast, at bedtime Instruction/counseling given: other instruction/counseling: encouraged CONTINUED weight loss.  Educational resources provided: brochure (denies) Self management tools provided:   Other plans: Urine microalbumin/cr attempted today, however, did not get appropriate urine specimen. Will re-attempt at next clinic visit. Patient not on ACEI b/c has a h/o "swelling on ACEI. Will address this at next visit.

## 2015-01-14 NOTE — Progress Notes (Signed)
Subjective:   Patient ID: [name] female   DOB: 01-26-1966 49 y.o.   MRN: [id number]  HPI: [name] is a 49 y.o. female w/ PMHx of HTN, HLD, uncontrolled DM type II, OSA, CAD, CHF, and h/o CVA w/ left-sided residual weakness, presents to the clinic today for follow up. Patient is sad today b/c 1 year ago today her sister passed away. We discussed this for some time. Her other chronic medical conditions seem to be well controlled recently. BP is only mildly elevated today, blood sugars seem to be well controlled at this time. No recent seizure activity, has been compliant w/ Keppra, however, states she has only been taking 500 mg daily rather than bid as initially prescribed. She denies any recent chest pain, SOB, dizziness, lightheadedness, nausea, or vomiting.  Her family life seems stable recently, no issues w/ children, siblings, etc. This typically seems to be a problem. [name] also appears to have lost some weight. Today, she is 254 lbs compared to 267 lbs in 09/2014. She states she has been trying to walk more but continues to have some issues w/ her LLE weakness. Walks w/ a cane.   Past Medical History  Diagnosis Date  . Hypertension     Poorly controlled. Has had HTN since age 62. Angioedema with ACEI.  24 Hr urine and renal arterial dopplers ordered . . . Never done  . NICM (nonischemic cardiomyopathy)     EF 45-50% in 8/12, cath 6/12 showed normal coronaries, EF 50-55% by LV gram  . Morbid obesity   . Allergic rhinitis   . HLD (hyperlipidemia)   . OSA on CPAP     sleep study in 8/12 showed moderate to severe OSA requiring CPAP  . Chronic lower back pain     secondary to DJD, obsetiy, hip problems. Followed by Dr. Ivory Broad (pain management)  . Chronic diastolic heart failure      Primarily diastolic CHF: Likely due to uncontrolled HTN. Last echo (8/12) with EF 45-50%, mild to moderate LVH with some asymmetric septal hypertrophy, RV normal size and systolic  function. EF 50-55% by LV-gram in 6/12.   . Polyneuropathy in diabetes(357.2)   . Thoracic or lumbosacral neuritis or radiculitis, unspecified   . Calcifying tendinitis of shoulder   . LBBB (left bundle branch block)   . Chronic combined systolic and diastolic CHF (congestive heart failure)     EF 40-45% by echo 12/06/2012  . Coronary artery disease     questionable. LHC 05/2011 showing normal coronaries // Followed at [facility], Dr. Shirlee Latch  . GERD (gastroesophageal reflux disease)   . Shortness of breath   . Asthma   . Automatic implantable cardioverter-defibrillator in situ   . Stroke 12/2013    "my left side is paralyzed" (07/04/2014)  . Type II diabetes mellitus DX: 2002  . 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 Degeneration of lumbar or lumbosacral intervertebral disc   . Arthritis     "hips, back, legs, arms" (07/04/2014)  . Frequent UTI   . Liver disease    Current Outpatient Prescriptions  Medication Sig Dispense Refill  . amLODipine (NORVASC) 5 MG tablet Take 1 tablet (5 mg total) by mouth daily. 30 tablet 6  . aspirin 325 MG tablet Take 1 tablet (325 mg total) by mouth daily. 30 tablet 5  . citalopram (CELEXA) 20 MG tablet Take 1 tablet (20 mg total) by mouth  daily. 30 tablet 5  . cyclobenzaprine (FLEXERIL) 5 MG tablet Take 1 tablet (5 mg total) by mouth 3 (three) times daily as needed for muscle spasms. 30 tablet 1  . diclofenac sodium (VOLTAREN) 1 % GEL Apply 2 g topically 4 (four) times daily as needed (pain). 3 Tube 5  . digoxin (LANOXIN) 0.125 MG tablet Take 0.125 mg by mouth daily.    . fluconazole (DIFLUCAN) 150 MG tablet Take 1 tablet (150 mg total) by mouth every 3 (three) days. 3 tablet 0  . fluticasone (CUTIVATE) 0.05 % cream Apply topically 2 (two) times daily. Dispense two tubes 30 g 6  . furosemide (LASIX) 40 MG tablet Take 40 mg by mouth daily as needed for fluid.    [name] glucose blood (ACCU-CHEK SMARTVIEW) test strip Use to Check  Blood Sugar TID. Code: E11.40. 100 each 12  . hydrALAZINE (APRESOLINE) 100 MG tablet Take 0.5 tablets (50 mg total) by mouth 3 (three) times daily. 90 tablet 3  . Incontinence Supplies MISC 1 year supply of Pull-ups in size XL Diagnosis Code: 788.33 1 each 0  . Insulin Glargine (LANTUS SOLOSTAR) 100 UNIT/ML Solostar Pen Inject 10 units in the AM, 35 units in the PM 15 mL 11  . Insulin Pen Needle (GLOBAL EASE INJECT PEN NEEDLES) 31G X 5 MM MISC Use to inject insulin BID. Code: E 11.40 100 each 10  . ipratropium (ATROVENT) 0.06 % nasal spray Place 2 sprays into both nostrils 4 (four) times daily. 15 mL 1  . isosorbide mononitrate (IMDUR) 60 MG 24 hr tablet Take 1 tablet (60 mg total) by mouth every morning. 30 tablet 11  . levETIRAcetam (KEPPRA) 500 MG tablet Take 1 tablet (500 mg total) by mouth 2 (two) times daily. 60 tablet 3  . metFORMIN (GLUCOPHAGE) 1000 MG tablet Take 1,000 mg by mouth 2 (two) times daily with a meal.    . metoprolol succinate (TOPROL-XL) 25 MG 24 hr tablet Take 50 mg (2 tablets) twice daily. 180 tablet 3  . nystatin cream (MYCOSTATIN) Apply 1 application topically 2 (two) times daily. 30 g 0  . oxyCODONE-acetaminophen (PERCOCET) 10-325 MG per tablet Take 1 tablet by mouth every 8 (eight) hours as needed for pain. 90 tablet 0  . pantoprazole (PROTONIX) 40 MG tablet Take 40 mg by mouth daily.    . potassium chloride 20 MEQ TBCR Take 20 mEq by mouth daily. 90 tablet 3  . simvastatin (ZOCOR) 40 MG tablet Take 40 mg by mouth at bedtime.     . tizanidine (ZANAFLEX) 2 MG capsule Take 1 capsule (2 mg total) by mouth 3 (three) times daily. 90 capsule 3  . Vitamin D, Ergocalciferol, (DRISDOL) 50000 UNITS CAPS capsule Take 1 capsule (50,000 Units total) by mouth every 7 (seven) days. 24 capsule 0   No current facility-administered medications for this visit.    Review of Systems: General: Denies fever, chills, diaphoresis, appetite change and fatigue.  Respiratory: Denies SOB, DOE,  cough, chest tightness, and wheezing.  Cardiovascular: Denies chest pain and palpitations.  Gastrointestinal: Denies nausea, vomiting, abdominal pain, diarrhea, constipation, blood in stool and abdominal distention.  Genitourinary: Denies dysuria, frequency, hematuria, and flank pain.  Endocrine: Denies hot or cold intolerance, polyuria, and polydipsia.  Musculoskeletal: Positive for gait problem. Denies myalgias, back pain, joint swelling, arthralgias..  Skin: Denies pallor, rash and wounds.  Neurological: Positive for left-sided residual weakness, LUE weakness>LLE weakness. Denies dizziness, seizures, syncope, weakness, lightheadedness, numbness and headaches.  Psychiatric/Behavioral: Denies mood  changes, confusion, nervousness, sleep disturbance and agitation.  Objective:   Physical Exam: Filed Vitals:   01/14/15 1437  BP: 141/90  Pulse: 88  Temp: 97.7 F (36.5 C)  TempSrc: Oral  Height: 5\' 9"  (1.753 m)  Weight: 254 lb 1.6 oz (115.259 kg)  SpO2: 100%   General: Alert, cooperative, NAD. Tearful.  HEENT: PERRL, EOMI. Moist mucus membranes. Left-sided facial droop.  Neck: Full range of motion without pain, supple, no lymphadenopathy or carotid bruits Lungs: Clear to ascultation bilaterally, normal work of respiration, no wheezes, rales, rhonchi Heart: RRR, no murmurs, gallops, or rubs Abdomen: Soft, non-tender, non-distended, BS + Extremities: No cyanosis, clubbing, or edema. Left upper and lower extremity weakness.  Neurologic: Alert & oriented X3, cranial nerves II-XII intact, 1/5 strength in LUE, 4/5 in LLE. Otherwise strength intact. Sensory abnormalities noted over the LUE.   Assessment & Plan:   Please see problem based assessment and plan.

## 2015-01-14 NOTE — Assessment & Plan Note (Addendum)
-  Check lipid panel today. -Continue Zocor

## 2015-01-14 NOTE — Patient Instructions (Signed)
General Instructions:  1. Please schedule a follow up appointment for 3 months.   2. Please take all medications as prescribed.   Make sure to take Keppra 500 mg TWICE DAILY.   3. If you have worsening of your symptoms or new symptoms arise, please call the clinic ([phone]), or go to the ER immediately if symptoms are severe.  I AM SO PROUD OF YOU FOR WORKING SO HARD AT [facility]!!!  Please bring your medicines with you each time you come to clinic.  Medicines may include prescription medications, over-the-counter medications, herbal remedies, eye drops, vitamins, or other pills.   Progress Toward Treatment Goals:  Treatment Goal 01/14/2015  Hemoglobin A1C unchanged  Blood pressure unchanged  Prevent falls -    Self Care Goals & Plans:  Self Care Goal 01/14/2015  Manage my medications take my medicines as prescribed; bring my medications to every visit; refill my medications on time  Monitor my health keep track of my blood glucose; bring my glucose meter and log to each visit  Eat healthy foods drink diet soda or water instead of juice or soda; eat more vegetables; eat foods that are low in salt; eat baked foods instead of fried foods; eat fruit for snacks and desserts  Be physically active -  Meeting treatment goals maintain the current self-care plan    Home Blood Glucose Monitoring 01/14/2015  Check my blood sugar 2 times a day  When to check my blood sugar before breakfast; at bedtime     Care Management & Community Referrals:  Referral 01/14/2015  Referrals made for care management support none needed  Referrals made to community resources none

## 2015-01-14 NOTE — Assessment & Plan Note (Signed)
BP Readings from Last 3 Encounters:  01/14/15 141/90  01/07/15 126/84  01/01/15 162/105    Lab Results  Component Value Date   NA 137 10/15/2014   K 3.8 10/15/2014   CREATININE 0.61 10/15/2014    Assessment: Blood pressure control: mildly elevated Progress toward BP goal:  unchanged Comments: BP mildly elevated, however, decent control for [name]. Has been compliant w/ medications and appears to have lost some weight (254 lbs down from 264 lbs in 10/2014).  Plan: Medications:  continue current medications Educational resources provided: brochure (denies) Self management tools provided:   Other plans: Repeat BMP in 3 months

## 2015-01-15 NOTE — Assessment & Plan Note (Signed)
Stable. Follows w/ Dr. Riley Kill regarding her spastic hemiparesis and pain. Doing quite well overall. Continues to ambulate as much as possible w/ a cane.  -Continue to encourage ambulation -May need electronic wheelchair much further into the future.

## 2015-01-15 NOTE — Progress Notes (Signed)
Internal Medicine Clinic Attending  Case discussed with Dr. Jones at the time of the visit.  We reviewed the resident's history and exam and pertinent patient test results.  I agree with the assessment, diagnosis, and plan of care documented in the resident's note.  

## 2015-01-24 ENCOUNTER — Encounter: Payer: Self-pay | Admitting: *Deleted

## 2015-01-28 ENCOUNTER — Telehealth: Payer: Self-pay | Admitting: *Deleted

## 2015-01-28 ENCOUNTER — Ambulatory Visit: Payer: Medicaid Other | Admitting: Adult Health

## 2015-01-28 NOTE — Telephone Encounter (Signed)
Patient did not show up for appointment must reschedule 

## 2015-01-29 ENCOUNTER — Telehealth (HOSPITAL_COMMUNITY): Payer: Self-pay | Admitting: Cardiology

## 2015-01-29 ENCOUNTER — Encounter: Payer: Self-pay | Admitting: Adult Health

## 2015-01-29 NOTE — Telephone Encounter (Signed)
Ann, with [facility] called to report elevated B/P reading on pts home telemonitor system B/p 159/127 @934AM  Pt does have some dizziness  Pt reports b/p elevated for a few days Reports no missed doses, mild dizziness, does not feel good pt states "i do not feel good heart wise" Denies chest pains  Advised pt to have symptoms evaluated urgent care or ER Pt will contact daughter for transportation

## 2015-01-30 ENCOUNTER — Ambulatory Visit: Payer: Medicaid Other | Admitting: Neurology

## 2015-02-03 ENCOUNTER — Encounter: Payer: Self-pay | Admitting: Registered Nurse

## 2015-02-03 ENCOUNTER — Encounter: Payer: Medicaid Other | Attending: Physical Medicine & Rehabilitation | Admitting: Registered Nurse

## 2015-02-03 VITALS — BP 150/87 | HR 97 | Resp 14

## 2015-02-03 DIAGNOSIS — I5042 Chronic combined systolic (congestive) and diastolic (congestive) heart failure: Secondary | ICD-10-CM | POA: Diagnosis present

## 2015-02-03 DIAGNOSIS — E1342 Other specified diabetes mellitus with diabetic polyneuropathy: Secondary | ICD-10-CM | POA: Diagnosis present

## 2015-02-03 DIAGNOSIS — I63319 Cerebral infarction due to thrombosis of unspecified middle cerebral artery: Secondary | ICD-10-CM | POA: Insufficient documentation

## 2015-02-03 DIAGNOSIS — M47817 Spondylosis without myelopathy or radiculopathy, lumbosacral region: Secondary | ICD-10-CM | POA: Diagnosis present

## 2015-02-03 DIAGNOSIS — Z5181 Encounter for therapeutic drug level monitoring: Secondary | ICD-10-CM | POA: Insufficient documentation

## 2015-02-03 DIAGNOSIS — Z79899 Other long term (current) drug therapy: Secondary | ICD-10-CM | POA: Diagnosis present

## 2015-02-03 DIAGNOSIS — G8110 Spastic hemiplegia affecting unspecified side: Secondary | ICD-10-CM | POA: Insufficient documentation

## 2015-02-03 DIAGNOSIS — G8114 Spastic hemiplegia affecting left nondominant side: Secondary | ICD-10-CM

## 2015-02-03 DIAGNOSIS — G629 Polyneuropathy, unspecified: Secondary | ICD-10-CM | POA: Insufficient documentation

## 2015-02-03 DIAGNOSIS — E1142 Type 2 diabetes mellitus with diabetic polyneuropathy: Secondary | ICD-10-CM

## 2015-02-03 DIAGNOSIS — M1712 Unilateral primary osteoarthritis, left knee: Secondary | ICD-10-CM

## 2015-02-03 MED ORDER — OXYCODONE-ACETAMINOPHEN 10-325 MG PO TABS: 1.0000 | ORAL_TABLET | Freq: Three times a day (TID) | ORAL | Status: DC | PRN

## 2015-02-03 MED ORDER — OXYCODONE-ACETAMINOPHEN 10-325 MG PO TABS
1.0000 | ORAL_TABLET | Freq: Three times a day (TID) | ORAL | Status: DC | PRN
Start: 2015-02-03 — End: 2015-02-03

## 2015-02-03 NOTE — Progress Notes (Signed)
Subjective:    Patient ID: [name], female    DOB: Mar 11, 1966, 49 y.o.   MRN: [id number]  HPI:[name] is a 49 year old female who returns for follow up for chronic pain and medication refill. She says her pain is located in her left arm and left knee. Having muscle spasms she states.She rates her pain 7. Her current exercise regime is performing stretching exercises daily and walking with her walker. She arrived in her wheelchair, she's using her 4 prong cane for support.   Pain Inventory Average Pain 7 Pain Right Now 7 My pain is stabbing  In the last 24 hours, has pain interfered with the following? General activity 0 Relation with others 0 Enjoyment of life 0 What TIME of day is your pain at its worst? night Sleep (in general) Good  Pain is worse with: walking and sitting Pain improves with: rest and medication Relief from Meds: 7  Mobility use a cane ability to climb steps?  no do you drive?  yes  Function disabled: date disabled . I need assistance with the following:  dressing, bathing, meal prep, household duties and shopping  Neuro/Psych bladder control problems trouble walking  Prior Studies Any changes since last visit?  no  Physicians involved in your care Any changes since last visit?  no   Family History  Problem Relation Age of Onset  . Heart disease Mother 52    Died of MI at age 50 yo  . Heart disease Paternal Grandmother     requiring pacemaker.  [name] Heart disease Paternal Grandfather 17    Died of MI at possibly age 53-53yo  . Stroke Paternal Grandfather   . Heart disease Father 53    MI age 65yo requiring stenting  . Kidney disease Mother     requiring dialysis  . Congestive Heart Failure Mother   . Diabetes Father   . Diabetes Brother   . Heart disease Brother 8    MI at age 104 years old  . Breast cancer Paternal Aunt   . Breast cancer Maternal Grandmother   . Glaucoma Father    History   Social History  .  Marital Status: Single    Spouse Name: N/A  . Number of Children: 2  . Years of Education: 9th grade   Occupational History  . Unemployed     planning on getting disability   Social History Main Topics  . Smoking status: Never Smoker   . Smokeless tobacco: Never Used  . Alcohol Use: No  . Drug Use: No  . Sexual Activity:    Partners: Male    Birth Control/ Protection: Surgical   Other Topics Concern  . None   Social History Narrative   Lives in [place] with her son. Is able to read and write fluently in [place].   Past Surgical History  Procedure Laterality Date  . Tubal ligation  05/31/1985  . Carpal tunnel release Left   . Hemiarthroplasty shoulder fracture Right 1980's  . Multiple tooth extractions  ~ 2011    tumors removed ; "my whole top"  . Cardiac catheterization  05/2011  . Tee without cardioversion N/A 01/14/2014    Procedure: TRANSESOPHAGEAL ECHOCARDIOGRAM (TEE);  Surgeon: Thurmon Fair, MD;  Location: [facility] END[facility]COPY;  Service: Cardiovascular;  Laterality: N/A;  . Breast surgery Bilateral 2011    patient reports benign results  . Bi-ventricular implantable cardioverter defibrillator  (crt-d)  08/2013    [name] 08/23/2013  .  Bi-ventricular implantable cardioverter defibrillator N/A 08/22/2013    Procedure: BI-VENTRICULAR IMPLANTABLE CARDIOVERTER DEFIBRILLATOR  (CRT-D);  Surgeon: [name], MD;  Location: [facility] CATH LAB;  Service: Cardiovascular;  Laterality: N/A;   Past Medical History  Diagnosis Date  . Hypertension     Poorly controlled. Has had HTN since age 83. Angioedema with ACEI.  24 Hr urine and renal arterial dopplers ordered . . . Never done  . NICM (nonischemic cardiomyopathy)     EF 45-50% in 8/12, cath 6/12 showed normal coronaries, EF 50-55% by LV gram  . Morbid obesity   . Allergic rhinitis   . HLD (hyperlipidemia)   . OSA on CPAP     sleep study in 8/12 showed moderate to severe OSA requiring CPAP  . Chronic lower back pain     secondary to  DJD, obsetiy, hip problems. Followed by Dr. Ivory Broad (pain management)  . Chronic diastolic heart failure      Primarily diastolic CHF: Likely due to uncontrolled HTN. Last echo (8/12) with EF 45-50%, mild to moderate LVH with some asymmetric septal hypertrophy, RV normal size and systolic function. EF 50-55% by LV-gram in 6/12.   . Polyneuropathy in diabetes(357.2)   . Thoracic or lumbosacral neuritis or radiculitis, unspecified   . Calcifying tendinitis of shoulder   . LBBB (left bundle branch block)   . Chronic combined systolic and diastolic CHF (congestive heart failure)     EF 40-45% by echo 12/06/2012  . Coronary artery disease     questionable. LHC 05/2011 showing normal coronaries // Followed at [facility], Dr. Shirlee Latch  . GERD (gastroesophageal reflux disease)   . Shortness of breath   . Asthma   . Automatic implantable cardioverter-defibrillator in situ   . Stroke 12/2013    "my left side is paralyzed" (07/04/2014)  . Type II diabetes mellitus DX: 2002  . 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 Degeneration of lumbar or lumbosacral intervertebral disc   . Arthritis     "hips, back, legs, arms" (07/04/2014)  . Frequent UTI   . Liver disease    BP 150/87 mmHg  Pulse 97  Resp 14  SpO2 99%  LMP 01/11/2015  Opioid Risk Score:   Fall Risk Score: Moderate Fall Risk (6-13 points) (previously educated and given)  Review of Systems  Genitourinary:       Bladder control problems  Musculoskeletal: Positive for gait problem.  All other systems reviewed and are negative.      Objective:   Physical Exam  Constitutional: She is oriented to person, place, and time. She appears well-developed and well-nourished.  HENT:  Head: Normocephalic and atraumatic.  Neck: Normal range of motion. Neck supple.  Cardiovascular: Normal rate and regular rhythm.   Pulmonary/Chest: Effort normal and breath sounds normal.  Musculoskeletal:  Normal Muscle Bulk and  Muscle Testing Reveals: Upper Extremities: Right : Full ROM and Muscle Strength 5/5 Left: Carrying Left arm in a protective position. Lower Extremities: Right Full ROM and Muscle Strength 5/5 Left: Paralytic/ Arrived in wheelchair Walkin in room with 4 prong cane/ Steppage Gait  Neurological: She is alert and oriented to person, place, and time.  Skin: Skin is warm and dry.  Psychiatric: She has a normal mood and affect.  Nursing note and vitals reviewed.         Assessment & Plan:  1. Chronic low back pain: Continue Current Medication Regime. Refilled: oxyCODONE 10/325mg  one tablet every  8 hours as needed.#90. Second script given to accommodate Dr. Riley Kill appointment.  2. Diabetic peripheral neuropathy: Continue to monitor  3. Recent Right MCA infarct: Left leg with spasms. Continue Baclofen..Botox injection with Dr. Riley Kill.  4. OA: Continue Voltaren gel   20 minutes of face to face patient care time was spent during this visit. All questions were encouraged and answered.  F/U with Dr. Wilburt Finlay for Botox injection.

## 2015-02-08 ENCOUNTER — Emergency Department (HOSPITAL_COMMUNITY): Payer: Medicaid Other

## 2015-02-08 ENCOUNTER — Inpatient Hospital Stay (HOSPITAL_COMMUNITY)
Admission: EM | Admit: 2015-02-08 | Discharge: 2015-02-08 | DRG: 312 | Disposition: A | Discharge status: Home / Self Care | Payer: Medicaid Other | Attending: Internal Medicine | Admitting: Internal Medicine

## 2015-02-08 ENCOUNTER — Encounter (HOSPITAL_COMMUNITY): Payer: Self-pay | Admitting: *Deleted

## 2015-02-08 DIAGNOSIS — E785 Hyperlipidemia, unspecified: Secondary | ICD-10-CM | POA: Diagnosis present

## 2015-02-08 DIAGNOSIS — G4733 Obstructive sleep apnea (adult) (pediatric): Secondary | ICD-10-CM | POA: Diagnosis present

## 2015-02-08 DIAGNOSIS — I10 Essential (primary) hypertension: Secondary | ICD-10-CM | POA: Diagnosis present

## 2015-02-08 DIAGNOSIS — E1142 Type 2 diabetes mellitus with diabetic polyneuropathy: Secondary | ICD-10-CM | POA: Diagnosis present

## 2015-02-08 DIAGNOSIS — G8929 Other chronic pain: Secondary | ICD-10-CM | POA: Diagnosis present

## 2015-02-08 DIAGNOSIS — Z8673 Personal history of transient ischemic attack (TIA), and cerebral infarction without residual deficits: Secondary | ICD-10-CM | POA: Diagnosis not present

## 2015-02-08 DIAGNOSIS — Z79899 Other long term (current) drug therapy: Secondary | ICD-10-CM

## 2015-02-08 DIAGNOSIS — K219 Gastro-esophageal reflux disease without esophagitis: Secondary | ICD-10-CM | POA: Diagnosis present

## 2015-02-08 DIAGNOSIS — I429 Cardiomyopathy, unspecified: Secondary | ICD-10-CM | POA: Diagnosis present

## 2015-02-08 DIAGNOSIS — I2510 Atherosclerotic heart disease of native coronary artery without angina pectoris: Secondary | ICD-10-CM | POA: Diagnosis present

## 2015-02-08 DIAGNOSIS — Z95810 Presence of automatic (implantable) cardiac defibrillator: Secondary | ICD-10-CM

## 2015-02-08 DIAGNOSIS — I5042 Chronic combined systolic (congestive) and diastolic (congestive) heart failure: Secondary | ICD-10-CM | POA: Diagnosis present

## 2015-02-08 DIAGNOSIS — E860 Dehydration: Secondary | ICD-10-CM | POA: Diagnosis present

## 2015-02-08 DIAGNOSIS — J45909 Unspecified asthma, uncomplicated: Secondary | ICD-10-CM | POA: Diagnosis present

## 2015-02-08 DIAGNOSIS — R55 Syncope and collapse: Secondary | ICD-10-CM | POA: Diagnosis present

## 2015-02-08 DIAGNOSIS — Z6837 Body mass index (BMI) 37.0-37.9, adult: Secondary | ICD-10-CM | POA: Diagnosis not present

## 2015-02-08 DIAGNOSIS — M545 Low back pain: Secondary | ICD-10-CM | POA: Diagnosis present

## 2015-02-08 LAB — COMPREHENSIVE METABOLIC PANEL
ALK PHOS: 70 U/L (ref 39–117)
ALT: 12 U/L (ref 0–35)
AST: 19 U/L (ref 0–37)
Albumin: 4 g/dL (ref 3.5–5.2)
Anion gap: 8 (ref 5–15)
BILIRUBIN TOTAL: 0.3 mg/dL (ref 0.3–1.2)
BUN: 7 mg/dL (ref 6–23)
CALCIUM: 9.1 mg/dL (ref 8.4–10.5)
CHLORIDE: 104 mmol/L (ref 96–112)
CO2: 26 mmol/L (ref 19–32)
CREATININE: 0.95 mg/dL (ref 0.50–1.10)
GFR calc Af Amer: 81 mL/min — ABNORMAL LOW (ref >90)
GFR calc non Af Amer: 70 mL/min — ABNORMAL LOW (ref >90)
Glucose, Bld: 222 mg/dL — ABNORMAL HIGH (ref 70–99)
Potassium: 3.6 mmol/L (ref 3.5–5.1)
SODIUM: 138 mmol/L (ref 135–145)
TOTAL PROTEIN: 7.4 g/dL (ref 6.0–8.3)

## 2015-02-08 LAB — CBC
HCT: 38.2 % (ref 36.0–46.0)
Hemoglobin: 12.3 g/dL (ref 12.0–15.0)
MCH: 26.7 pg (ref 26.0–34.0)
MCHC: 32.2 g/dL (ref 30.0–36.0)
MCV: 83 fL (ref 78.0–100.0)
PLATELETS: 213 10*3/uL (ref 150–400)
RBC: 4.6 MIL/uL (ref 3.87–5.11)
RDW: 13 % (ref 11.5–15.5)
WBC: 9.3 10*3/uL (ref 4.0–10.5)

## 2015-02-08 LAB — PROTIME-INR
INR: 1.02 (ref 0.00–1.49)
Prothrombin Time: 13.5 seconds (ref 11.6–15.2)

## 2015-02-08 LAB — CBG MONITORING, ED: Glucose-Capillary: 181 mg/dL — ABNORMAL HIGH (ref 70–99)

## 2015-02-08 LAB — I-STAT TROPONIN, ED: Troponin i, poc: 0 ng/mL (ref 0.00–0.08)

## 2015-02-08 MED ORDER — MORPHINE SULFATE 2 MG/ML IJ SOLN
2.0000 mg | Freq: Once | INTRAMUSCULAR | Status: AC
  Administered 2015-02-08: 2 mg via INTRAVENOUS
  Filled 2015-02-08: qty 1

## 2015-02-08 MED ORDER — ACETAMINOPHEN 325 MG PO TABS: 650.0000 mg | ORAL_TABLET | Freq: Four times a day (QID) | ORAL | Status: DC | PRN

## 2015-02-08 MED ORDER — ONDANSETRON HCL 4 MG/2ML IJ SOLN
INTRAMUSCULAR | Status: AC
  Filled 2015-02-08: qty 2

## 2015-02-08 MED ORDER — ONDANSETRON HCL 4 MG/2ML IJ SOLN
4.0000 mg | Freq: Once | INTRAMUSCULAR | Status: AC
  Administered 2015-02-08: 4 mg via INTRAVENOUS

## 2015-02-08 NOTE — ED Notes (Signed)
Patient refuses to get into bed and is sitting in a wheelchair. Pt states that she is going to leave in 1 hour if she does not get a room.

## 2015-02-08 NOTE — ED Notes (Signed)
Cardiology at bedside.

## 2015-02-08 NOTE — ED Notes (Signed)
CBG 181.  

## 2015-02-08 NOTE — ED Notes (Signed)
Per EMS- pt was seen by neighbors walking to her car at approx 0720. Pt was then found on the ground by her car. Pt was initially responsive to pain. Pt has hx of siezures and stroke last year with left sided weakness. Pt is alert and orientedx3

## 2015-02-08 NOTE — Progress Notes (Signed)
Pt on room air with spo2 95%. Rt will continue to monitor.

## 2015-02-08 NOTE — ED Notes (Signed)
Pt requested a happy meal with cranberry juice to drink. Pt was given the same.

## 2015-02-08 NOTE — Consult Note (Signed)
Date: 02/08/2015               Patient Name:  [name] MRN: [id number]  DOB: [birth year] Age / Sex: 49 y.o., female   PCP: Corky Sox, MD         Requesting Physician: Dr. 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 Pt states she was ambulating down the stairs outside the "best she has ever done" since her stroke. She remembers reaching for the car and falling backwards. She then states she woke up in the ambulance. Denies any postictal confusion, urinary/bowel incontinence, and tongue biting. She does state she felt her left arm jerking prior to the syncopal episode and that she may or may not have felt like the room was spinning. She denies chest pain or palpitations. Her HR has been up to the 120's lately the past 2 weeks. She also notes that she just started her menstrual cycle today. She has been eating and drinking well. Endorses medication compliance with keppra and denies any hypoglycemic episodes at home with insulin regimen.   Last admitted on 10/14/14 for a syncopal episode. She was started on keppra by neuology during that admission as seizures are common within 1st year of large CVAs. EEG at that time was neg for seizure activity. She follows w/ [facility] and was last seen on 10/30/14.    Meds: Current Facility-Administered Medications  Medication Dose Route Frequency Provider Last Rate Last Dose  . acetaminophen (TYLENOL) tablet 650 mg  650 mg Oral Q6H PRN Francesca Oman, DO       Current Outpatient Prescriptions  Medication Sig Dispense Refill  . amLODipine (NORVASC) 5 MG tablet Take 1 tablet (5 mg total) by mouth daily. 30 tablet 6  . aspirin 325 MG tablet Take 1 tablet  (325 mg total) by mouth daily. 30 tablet 5  . Blood Glucose Monitoring Suppl (ACCU-CHEK AVIVA PLUS) W/DEVICE KIT 1 each by Does not apply route 3 (three) times daily. 1 kit 0  . citalopram (CELEXA) 20 MG tablet Take 1 tablet (20 mg total) by mouth daily. 30 tablet 5  . cyclobenzaprine (FLEXERIL) 5 MG tablet Take 1 tablet (5 mg total) by mouth 3 (three) times daily as needed for muscle spasms. 30 tablet 1  . diclofenac sodium (VOLTAREN) 1 % GEL Apply 2 g topically 4 (four) times daily as needed (pain). 3 Tube 5  . digoxin (LANOXIN) 0.125 MG tablet Take 0.125 mg by mouth daily.    . fluticasone (CUTIVATE) 0.05 % cream Apply topically 2 (two) times daily. Dispense two tubes 30 g 6  . furosemide (LASIX) 40 MG tablet Take 40 mg by mouth daily as needed for fluid.    [name] glucose blood (ACCU-CHEK SMARTVIEW) test strip Use to Check Blood Sugar TID. Code: E11.40. 100 each 12  . hydrALAZINE (APRESOLINE) 100 MG tablet Take 0.5 tablets (50 mg total) by mouth 3 (three) times daily. 90 tablet 3  . Incontinence Supplies MISC 1 year supply of Pull-ups in size XL Diagnosis Code: 788.33 1 each 0  . Insulin Glargine (LANTUS SOLOSTAR) 100 UNIT/ML Solostar Pen Inject 10 units in the AM, 35 units in  the PM 15 mL 11  . Insulin Pen Needle (GLOBAL EASE INJECT PEN NEEDLES) 31G X 5 MM MISC Use to inject insulin BID. Code: E 11.40 100 each 10  . ipratropium (ATROVENT) 0.06 % nasal spray Place 2 sprays into both nostrils 4 (four) times daily. (Patient taking differently: Place 2 sprays into both nostrils daily as needed for rhinitis. ) 15 mL 1  . isosorbide mononitrate (IMDUR) 60 MG 24 hr tablet Take 1 tablet (60 mg total) by mouth every morning. 30 tablet 11  . levETIRAcetam (KEPPRA) 500 MG tablet Take 1 tablet (500 mg total) by mouth 2 (two) times daily. 60 tablet 3  . metFORMIN (GLUCOPHAGE) 1000 MG tablet Take 1,000 mg by mouth daily.     . metoprolol succinate (TOPROL-XL) 25 MG 24 hr tablet Take 50 mg (2 tablets) twice  daily. 180 tablet 3  . nystatin cream (MYCOSTATIN) Apply 1 application topically 2 (two) times daily. (Patient taking differently: Apply 1 application topically daily as needed for dry skin. ) 30 g 0  . oxyCODONE-acetaminophen (PERCOCET) 10-325 MG per tablet Take 1 tablet by mouth every 8 (eight) hours as needed for pain. 90 tablet 0  . pantoprazole (PROTONIX) 40 MG tablet Take 40 mg by mouth daily.    . potassium chloride 20 MEQ TBCR Take 20 mEq by mouth daily. 90 tablet 3  . simvastatin (ZOCOR) 40 MG tablet Take 40 mg by mouth at bedtime.     . Vitamin D, Ergocalciferol, (DRISDOL) 50000 UNITS CAPS capsule Take 1 capsule (50,000 Units total) by mouth every 7 (seven) days. 24 capsule 0  . tizanidine (ZANAFLEX) 2 MG capsule Take 1 capsule (2 mg total) by mouth 3 (three) times daily. 90 capsule 3    Allergies: Allergies as of 02/08/2015 - Review Complete 02/08/2015  Allergen Reaction Noted  . Clindamycin Anaphylaxis and Swelling   . Ace inhibitors Swelling 07/04/2014  . Enalapril maleate Swelling   . Lisinopril Swelling    Past Medical History  Diagnosis Date  . Hypertension     Poorly controlled. Has had HTN since age 74. Angioedema with ACEI.  24 Hr urine and renal arterial dopplers ordered . . . Never done  . NICM (nonischemic cardiomyopathy)     EF 45-50% in 8/12, cath 6/12 showed normal coronaries, EF 50-55% by LV gram  . Morbid obesity   . Allergic rhinitis   . HLD (hyperlipidemia)   . OSA on CPAP     sleep study in 8/12 showed moderate to severe OSA requiring CPAP  . Chronic lower back pain     secondary to DJD, obsetiy, hip problems. Followed by Dr. Oval Linsey (pain management)  . Chronic diastolic heart failure      Primarily diastolic CHF: Likely due to uncontrolled HTN. Last echo (8/12) with EF 45-50%, mild to moderate LVH with some asymmetric septal hypertrophy, RV normal size and systolic function. EF 50-55% by LV-gram in 6/12.   . Polyneuropathy in diabetes(357.2)   .  Thoracic or lumbosacral neuritis or radiculitis, unspecified   . Calcifying tendinitis of shoulder   . LBBB (left bundle branch block)   . Chronic combined systolic and diastolic CHF (congestive heart failure)     EF 40-45% by echo 12/06/2012  . Coronary artery disease     questionable. LHC 05/2011 showing normal coronaries // Followed at [facility], Dr. Aundra Dubin  . GERD (gastroesophageal reflux disease)   . Shortness of breath   . Asthma   . Automatic  implantable cardioverter-defibrillator in situ   . Stroke 12/2013    "my left side is paralyzed" (07/04/2014)  . Type II diabetes mellitus DX: 2002  . 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 Degeneration of lumbar or lumbosacral intervertebral disc   . Arthritis     "hips, back, legs, arms" (07/04/2014)  . Frequent UTI   . Liver disease    Past Surgical History  Procedure Laterality Date  . Tubal ligation  05/31/1985  . Carpal tunnel release Left   . Hemiarthroplasty shoulder fracture Right 1980's  . Multiple tooth extractions  ~ 2011    tumors removed ; "my whole top"  . Cardiac catheterization  05/2011  . Tee without cardioversion N/A 01/14/2014    Procedure: TRANSESOPHAGEAL ECHOCARDIOGRAM (TEE);  Surgeon: Thurmon Fair, MD;  Location: [facility] ENDOSCOPY;  Service: Cardiovascular;  Laterality: N/A;  . Breast surgery Bilateral 2011    patient reports benign results  . Bi-ventricular implantable cardioverter defibrillator  (crt-d)  08/2013    [name] 08/23/2013  . Bi-ventricular implantable cardioverter defibrillator N/A 08/22/2013    Procedure: BI-VENTRICULAR IMPLANTABLE CARDIOVERTER DEFIBRILLATOR  (CRT-D);  Surgeon: [name], MD;  Location: [facility] CATH LAB;  Service: Cardiovascular;  Laterality: N/A;   Family History  Problem Relation Age of Onset  . Heart disease Mother 52    Died of MI at age 37 yo  . Heart disease Paternal Grandmother     requiring pacemaker.  [name] Heart disease Paternal Grandfather 81     Died of MI at possibly age 48-53yo  . Stroke Paternal Grandfather   . Heart disease Father 43    MI age 83yo requiring stenting  . Kidney disease Mother     requiring dialysis  . Congestive Heart Failure Mother   . Diabetes Father   . Diabetes Brother   . Heart disease Brother 81    MI at age 6 years old  . Breast cancer Paternal Aunt   . Breast cancer Maternal Grandmother   . Glaucoma Father    History   Social History  . Marital Status: Single    Spouse Name: N/A  . Number of Children: 2  . Years of Education: 9th grade   Occupational History  . Unemployed     planning on getting disability   Social History Main Topics  . Smoking status: Never Smoker   . Smokeless tobacco: Never Used  . Alcohol Use: No  . Drug Use: No  . Sexual Activity:    Partners: Male    Birth Control/ Protection: Surgical   Other Topics Concern  . Not on file   Social History Narrative   Lives in [place] with her son. Is able to read and write fluently in [place].    Review of Systems: Denies dysuria, diarrhea, cough, sore throat, n/v, decreased po intake, recent illness or sick contacts, and fevers.   Physical Exam: Blood pressure 168/113, pulse 98, temperature 97.6 F (36.4 C), temperature source Oral, resp. rate 15, height 5\' 9"  (1.753 m), weight 255 lb (115.667 kg), last menstrual period 01/11/2015, SpO2 97 %. General: NAD, laying in bed comfortably, pt able to stand up during exam w/o difficulty HEENT: pupils reactive to light, moist mucous membranes Lungs: CTAB, no wheezing Cardiac: RRR, no murmurs GI: soft, active bowel sounds, non tender to palpation Neuro: facial sensation intact, left facial droop, left foot drop  Lab results: Basic Metabolic Panel:  Recent Labs  01/13/2015 0857  NA 138  K 3.6  CL 104  CO2 26  GLUCOSE 222*  BUN 7  CREATININE 0.95  CALCIUM 9.1   Liver Function Tests:  Recent Labs  02/08/15 0857  AST 19  ALT 12  ALKPHOS 70  BILITOT 0.3    PROT 7.4  ALBUMIN 4.0   CBC:  Recent Labs  02/08/15 0857  WBC 9.3  HGB 12.3  HCT 38.2  MCV 83.0  PLT 213   CBG:  Recent Labs  02/08/15 1001  GLUCAP 181*   Coagulation:  Recent Labs  02/08/15 0857  LABPROT 13.5  INR 1.02     Imaging results:  Ct Head Wo Contrast  02/08/2015   CLINICAL DATA:  Found down.  History of seizures and stroke.  EXAM: CT HEAD WITHOUT CONTRAST  TECHNIQUE: Contiguous axial images were obtained from the base of the skull through the vertex without intravenous contrast.  COMPARISON:  10/14/2014  FINDINGS: Old right frontal infarct with encephalomalacia. Old lacunar infarct in the right thalamus. No acute infarction. No hemorrhage. No hydrocephalus. No acute calvarial abnormality.  IMPRESSION: Old right MCA infarct.  No acute intracranial abnormality.   Electronically Signed   By: Rolm Baptise M.D.   On: 02/08/2015 11:42    Other results: BZJ:IRCVE tachycardia, ventricular rate 106, v paced.   Assessment, Plan, & Recommendations by Problem: Active Problems:   Syncopal episodes  Syncope-- Pt feels back at her baseline. Requesting to leave as she has to move out of her house on 2/29 and states she has a lot of things to do. She does not want to stay for observation, and states her daughter is coming to pick her up at 5:00 pm. Dr. Rayann Heman w/ cardiology saw patient in the ED and reviewed BiV ICD that was neg for arrhythmias. Did reveal elevated ventricular rate around the time of pt's syncopal event which he states represented sinus tachycardia. Dr. Rayann Heman has low suspicion for primary arrhythmia. On further chart review pt had a TEE  on 01/14/14 EF 35-40% positive for significant burden of hyperechoic material in rt atrium with absence of rt to left shunt and thus not likely to cause an embolic event. CTA of neck on 01/11/14 was neg for internal carotid arteries stenosis and were noted to be patent b/l. Pt denies hypoglycemia and states her CBG was around  100's this morning before coming to the ED. Denies CBGs in the 90's while at home. CBG on admission was 110 and reported to be 150 by EMS. Pt was admitted for a witnessed seizure on 10/14/14, EEG was neg for epileptiform abnormalities, pt started on keppra $RemoveB'500mg'WTbgaSPx$  BID during that admission. 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 Pt reports compliance w/ seizure med. She did state that her left arm started jerking like it previously did when she had a seizure in November 2015 but denies postictal confusion, urinary or bowel incontinence, and tongue biting. Can recommend having a Keppra level checked at outpatient follow up and also have pt reschedule to see [facility] as it appears that patient no showed for neurology appt on 01/28/15. During exam while getting patient to stand up her heart rate went from the 90's up to the 120's upon standing. Pt denied feeling dizzy when standing and states she has been ambulating around the ED while waiting.  Thus pt may have passed out due to dehydration. Also could be vasovagal as pt was outside  when syncopal episode occurred and may have over exerted herself while walking down the stairs. CT head obtained while in the ED was negative acute intracranial abnormality. Pt adamant about going home, refusing to stay overnight. Verbalizes understanding to come back to the ED if symptoms return and states she will come in to clinic next week for ED f/u appt. Denies any transportation issues getting to clinic, lives with her son who is always around the house.    Dispo: stable for discharge home today with close clinic follow up appointment. Sent [facility] clinic front desk a note to schedule pt for an appt next week. Instructed to call clinic or return to ED if symptoms return.   The patient does have a current PCP Corky Sox, MD) and does need an [facility] hospital follow-up appointment after discharge.  The  patient does not have transportation limitations that hinder transportation to clinic appointments.  Signed: Julious Oka, MD 02/08/2015, 5:43 PM

## 2015-02-08 NOTE — ED Provider Notes (Signed)
  Physical Exam  BP 168/113 mmHg  Pulse 98  Temp(Src) 97.6 F (36.4 C) (Oral)  Resp 15  Ht 5\' 9"  (1.753 m)  Wt 255 lb (115.667 kg)  BMI 37.64 kg/m2  SpO2 97%  LMP 01/11/2015  Physical Exam  ED Course  Procedures  [facility] has seen patient and cleared them. PCP has seen the patient along with her attending and patient will be discharged home.      01/13/2015. Juliet Rude, MD 02/08/15 [phone]

## 2015-02-08 NOTE — ED Provider Notes (Signed)
CSN: [id number]     Arrival date & time 02/08/15  8250 History   First MD Initiated Contact with Patient 02/08/15 [phone]     Chief Complaint  Patient presents with  . Seizures     (Consider location/radiation/quality/duration/timing/severity/associated sxs/prior Treatment) HPI  This 49 year old lady with morbid obesity history of hyper tension status post stroke with residual left-sided weakness who was walking to her car today with her 4 pronged walker. She does not know what happened after that and was found on the ground. She does have a history of a seizure in the past. It was noted by bystanders that she was seen walking the car. She remembers that she was going out to go to the flea market. She does not have any prodrome. She had been in her usual state of health which is generally poor but had not noted any acute episodes of recent illness. She currently feels slightly generally weak with no increase in focal lateralized deficits. She complains of no pain after being found on the ground. She lives with her son but he was inside and has thus far not been reached. Her daughter has been here at the emergency department with her and feels like she is in her usual state of health currently. Her primary care physician is in the internal medicine clinic. She is taking Keppra for what was presumed to be a seizure in the past. This has occurred only once and has occurred since her stroke.  Patient with history of dm but no complaints of hypoglycemia. BS reported 150.  Patient with historyof implantable cardioverter/defbrillator. No chest pain.  Patient states going out to car felt weak, head turned to left, next awoke in ambulance-denies confusion on awakening.     Past Medical History  Diagnosis Date  . Hypertension     Poorly controlled. Has had HTN since age 29. Angioedema with ACEI.  24 Hr urine and renal arterial dopplers ordered . . . Never done  . NICM (nonischemic cardiomyopathy)     EF  45-50% in 8/12, cath 6/12 showed normal coronaries, EF 50-55% by LV gram  . Morbid obesity   . Allergic rhinitis   . HLD (hyperlipidemia)   . OSA on CPAP     sleep study in 8/12 showed moderate to severe OSA requiring CPAP  . Chronic lower back pain     secondary to DJD, obsetiy, hip problems. Followed by Dr. Oval Linsey (pain management)  . Chronic diastolic heart failure      Primarily diastolic CHF: Likely due to uncontrolled HTN. Last echo (8/12) with EF 45-50%, mild to moderate LVH with some asymmetric septal hypertrophy, RV normal size and systolic function. EF 50-55% by LV-gram in 6/12.   . Polyneuropathy in diabetes(357.2)   . Thoracic or lumbosacral neuritis or radiculitis, unspecified   . Calcifying tendinitis of shoulder   . LBBB (left bundle branch block)   . Chronic combined systolic and diastolic CHF (congestive heart failure)     EF 40-45% by echo 12/06/2012  . Coronary artery disease     questionable. LHC 05/2011 showing normal coronaries // Followed at [facility], Dr. Aundra Dubin  . GERD (gastroesophageal reflux disease)   . Shortness of breath   . Asthma   . Automatic implantable cardioverter-defibrillator in situ   . Stroke 12/2013    "my left side is paralyzed" (07/04/2014)  . Type II diabetes mellitus DX: 2002  . DJD (degenerative joint disease) of hip     right  sided  . Degeneration of lumbar or lumbosacral intervertebral disc   . Arthritis     "hips, back, legs, arms" (07/04/2014)  . Frequent UTI   . Liver disease    Past Surgical History  Procedure Laterality Date  . Tubal ligation  05/31/1985  . Carpal tunnel release Left   . Hemiarthroplasty shoulder fracture Right 1980's  . Multiple tooth extractions  ~ 2011    tumors removed ; "my whole top"  . Cardiac catheterization  05/2011  . Tee without cardioversion N/A 01/14/2014    Procedure: TRANSESOPHAGEAL ECHOCARDIOGRAM (TEE);  Surgeon: Sanda Klein, MD;  Location: [facility] ENDOSCOPY;  Service: Cardiovascular;   Laterality: N/A;  . Breast surgery Bilateral 2011    patient reports benign results  . Bi-ventricular implantable cardioverter defibrillator  (crt-d)  08/2013    Archie Endo 08/23/2013  . Bi-ventricular implantable cardioverter defibrillator N/A 08/22/2013    Procedure: BI-VENTRICULAR IMPLANTABLE CARDIOVERTER DEFIBRILLATOR  (CRT-D);  Surgeon: Deboraha Sprang, MD;  Location: [facility] CATH LAB;  Service: Cardiovascular;  Laterality: N/A;   Family History  Problem Relation Age of Onset  . Heart disease Mother 37    Died of MI at age 15 yo  . Heart disease Paternal Grandmother     requiring pacemaker.  [name] Heart disease Paternal Grandfather 27    Died of MI at possibly age 28-53yo  . Stroke Paternal Grandfather   . Heart disease Father 57    MI age 71yo requiring stenting  . Kidney disease Mother     requiring dialysis  . Congestive Heart Failure Mother   . Diabetes Father   . Diabetes Brother   . Heart disease Brother 25    MI at age 72 years old  . Breast cancer Paternal Aunt   . Breast cancer Maternal Grandmother   . Glaucoma Father    History  Substance Use Topics  . Smoking status: Never Smoker   . Smokeless tobacco: Never Used  . Alcohol Use: No   OB History    No data available     Review of Systems  Unable to perform ROS All other systems reviewed and are negative.     Allergies  Clindamycin; Ace inhibitors; Enalapril maleate; and Lisinopril  Home Medications   Prior to Admission medications   Medication Sig Start Date End Date Taking? Authorizing Provider  amLODipine (NORVASC) 5 MG tablet Take 1 tablet (5 mg total) by mouth daily. 12/19/14   Deboraha Sprang, MD  aspirin 325 MG tablet Take 1 tablet (325 mg total) by mouth daily. 01/14/15   Corky Sox, MD  Blood Glucose Monitoring Suppl (ACCU-CHEK AVIVA PLUS) W/DEVICE KIT 1 each by Does not apply route 3 (three) times daily. 01/14/15   Corky Sox, MD  citalopram (CELEXA) 20 MG tablet Take 1 tablet (20 mg total) by mouth  daily. 10/24/14 10/22/15  Corky Sox, MD  cyclobenzaprine (FLEXERIL) 5 MG tablet Take 1 tablet (5 mg total) by mouth 3 (three) times daily as needed for muscle spasms. 01/07/15   Billy Fischer, MD  diclofenac sodium (VOLTAREN) 1 % GEL Apply 2 g topically 4 (four) times daily as needed (pain). 08/29/14   Meredith Staggers, MD  digoxin (LANOXIN) 0.125 MG tablet Take 0.125 mg by mouth daily.    Historical Provider, MD  fluticasone (CUTIVATE) 0.05 % cream Apply topically 2 (two) times daily. Dispense two tubes 07/11/14   Danella Sensing, NP  furosemide (LASIX) 40 MG tablet Take 40 mg by  mouth daily as needed for fluid.    Historical Provider, MD  glucose blood (ACCU-CHEK SMARTVIEW) test strip Use to Check Blood Sugar TID. Code: E11.40. 09/30/14   Corky Sox, MD  hydrALAZINE (APRESOLINE) 100 MG tablet Take 0.5 tablets (50 mg total) by mouth 3 (three) times daily. 10/07/14   Conrad [place], NP  Incontinence Supplies MISC 1 year supply of Pull-ups in size XL Diagnosis Code: 788.33 08/27/14   Corky Sox, MD  Insulin Glargine (LANTUS SOLOSTAR) 100 UNIT/ML Solostar Pen Inject 10 units in the AM, 35 units in the PM 10/01/14   Corky Sox, MD  Insulin Pen Needle (GLOBAL EASE INJECT PEN NEEDLES) 31G X 5 MM MISC Use to inject insulin BID. Code: E 11.40 09/30/14   Corky Sox, MD  ipratropium (ATROVENT) 0.06 % nasal spray Place 2 sprays into both nostrils 4 (four) times daily. 01/07/15   Billy Fischer, MD  isosorbide mononitrate (IMDUR) 60 MG 24 hr tablet Take 1 tablet (60 mg total) by mouth every morning. 08/21/14 08/21/15  Corky Sox, MD  levETIRAcetam (KEPPRA) 500 MG tablet Take 1 tablet (500 mg total) by mouth 2 (two) times daily. 10/16/14   Dellia Nims, MD  metFORMIN (GLUCOPHAGE) 1000 MG tablet Take 1,000 mg by mouth 2 (two) times daily with a meal. 04/12/14 04/12/15  Corky Sox, MD  metoprolol succinate (TOPROL-XL) 25 MG 24 hr tablet Take 50 mg (2 tablets) twice daily. 08/21/14   Deboraha Sprang, MD  nystatin cream  (MYCOSTATIN) Apply 1 application topically 2 (two) times daily. 11/30/14   Corky Sox, MD  oxyCODONE-acetaminophen (PERCOCET) 10-325 MG per tablet Take 1 tablet by mouth every 8 (eight) hours as needed for pain. 02/03/15   Danella Sensing, NP  pantoprazole (PROTONIX) 40 MG tablet Take 40 mg by mouth daily.    Historical Provider, MD  potassium chloride 20 MEQ TBCR Take 20 mEq by mouth daily. 08/07/14   Rande Brunt, NP  simvastatin (ZOCOR) 40 MG tablet Take 40 mg by mouth at bedtime.     Historical Provider, MD  tizanidine (ZANAFLEX) 2 MG capsule Take 1 capsule (2 mg total) by mouth 3 (three) times daily. 11/25/14   Meredith Staggers, MD  Vitamin D, Ergocalciferol, (DRISDOL) 50000 UNITS CAPS capsule Take 1 capsule (50,000 Units total) by mouth every 7 (seven) days. 09/02/14   Corky Sox, MD   BP 160/78 mmHg  Pulse 102  Temp(Src) 97.6 F (36.4 C) (Oral)  Resp 16  Ht $R'5\' 9"'de$  (1.753 m)  Wt 255 lb (115.667 kg)  BMI 37.64 kg/m2  SpO2 97%  LMP 01/11/2015 Physical Exam  Constitutional: She is oriented to person, place, and time.  Morbidly obese  HENT:  Head: Normocephalic and atraumatic.  Left facial droop  Eyes: Conjunctivae and EOM are normal. Pupils are equal, round, and reactive to light.  Neck: Normal range of motion. Neck supple.  Cardiovascular: Normal rate and normal heart sounds.   Pulmonary/Chest: Effort normal and breath sounds normal.  Abdominal: Soft. Bowel sounds are normal.  Musculoskeletal: She exhibits no edema or tenderness.  Neurological: She is alert and oriented to person, place, and time. She displays normal reflexes. A cranial nerve deficit is present. She exhibits normal muscle tone. Coordination normal.  Left upper and lower extremity weakness at baseline per patient  Skin: Skin is warm and dry.  Psychiatric: She has a normal mood and affect. Her behavior is normal. Judgment and thought content  normal.  Nursing note and vitals reviewed.   ED Course  Procedures  (including critical care time) Labs Review Labs Reviewed  COMPREHENSIVE METABOLIC PANEL - Abnormal; Notable for the following:    Glucose, Bld 222 (*)    GFR calc non Af Amer 70 (*)    GFR calc Af Amer 81 (*)    All other components within normal limits  CBG MONITORING, ED - Abnormal; Notable for the following:    Glucose-Capillary 181 (*)    All other components within normal limits  CBC  PROTIME-INR  URINALYSIS, ROUTINE W REFLEX MICROSCOPIC  I-STAT TROPOININ, ED    Imaging Review Ct Head Wo Contrast  02/08/2015   CLINICAL DATA:  Found down.  History of seizures and stroke.  EXAM: CT HEAD WITHOUT CONTRAST  TECHNIQUE: Contiguous axial images were obtained from the base of the skull through the vertex without intravenous contrast.  COMPARISON:  10/14/2014  FINDINGS: Old right frontal infarct with encephalomalacia. Old lacunar infarct in the right thalamus. No acute infarction. No hemorrhage. No hydrocephalus. No acute calvarial abnormality.  IMPRESSION: Old right MCA infarct.  No acute intracranial abnormality.   Electronically Signed   By: Rolm Baptise M.D.   On: 02/08/2015 11:42     EKG Interpretation   Date/Time:  Saturday February 08 2015 08:19:19 EST Ventricular Rate:  106 PR Interval:  170 QRS Duration: 121 QT Interval:  391 QTC Calculation: 519 R Axis:   -136 Text Interpretation:  Sinus tachycardia Nonspecific intraventricular  conduction delay Anterior infarct, old Confirmed by Arvis Zwahlen MD, Andee Poles  ([id number]) on 02/08/2015 10:04:26 AM      MDM   Final diagnoses:  Syncope and collapse     10:35 AM Discussed with [employer] rep states that patient had some episodes of increased heart rate at 180 today that were not over paced. It was noted to be at an episode at 807 and 829. However, we do not know if this is consistent with local time. She is contacting the local rep to do a query at the bedside  Discussed with local [employer] rep who states that interrogation took place  at 1118 per the device pocket which was 09/19/2012 hour time. It appears the patient had an episode of the tachyarrhythmia at about 7:30 which would correlate with the episode of unresponsiveness.  Discussed with Dr. Rayann Heman on call for cardiology and internal medicine resident.  Plan admit to step down.   Shaune Pollack, MD 02/08/15 [phone]

## 2015-02-08 NOTE — Consult Note (Signed)
CARDIOLOGY CONSULT NOTE     Primary Care Physician: Lars Masson, MD Referring Physician:  ED physician  Admit Date: 02/08/2015  Reason for consultation:  [name] is a 49 y.o. female with a h/o seizures, chronic sytolic dysfunction, prior BiV ICD implant, and prior stroke who is admitted with syncope.  The patient reports feeling well initially today.  She then began feeling "bad" as if she might have a "seizure".  She then had abrupt loss of consciousness.  She has since returned to her baseline health state and presently wants to go home.  Today, she denies symptoms of palpitations, chest pain, shortness of breath, orthopnea, PND, lower extremity edema, dizziness,  Or other neurologic sequela. The patient is tolerating medications without difficulties and is otherwise without complaint today.   Past Medical History  Diagnosis Date  . Hypertension     Poorly controlled. Has had HTN since age 21. Angioedema with ACEI.  24 Hr urine and renal arterial dopplers ordered . . . Never done  . NICM (nonischemic cardiomyopathy)     EF 45-50% in 8/12, cath 6/12 showed normal coronaries, EF 50-55% by LV gram  . Morbid obesity   . Allergic rhinitis   . HLD (hyperlipidemia)   . OSA on CPAP     sleep study in 8/12 showed moderate to severe OSA requiring CPAP  . Chronic lower back pain     secondary to DJD, obsetiy, hip problems. Followed by Dr. Ivory Broad (pain management)  . Chronic diastolic heart failure      Primarily diastolic CHF: Likely due to uncontrolled HTN. Last echo (8/12) with EF 45-50%, mild to moderate LVH with some asymmetric septal hypertrophy, RV normal size and systolic function. EF 50-55% by LV-gram in 6/12.   . Polyneuropathy in diabetes(357.2)   . Thoracic or lumbosacral neuritis or radiculitis, unspecified   . Calcifying tendinitis of shoulder   . LBBB (left bundle branch block)   . Chronic combined systolic and diastolic CHF (congestive heart failure)     EF  40-45% by echo 12/06/2012  . Coronary artery disease     questionable. LHC 05/2011 showing normal coronaries // Followed at [facility], Dr. Shirlee Latch  . GERD (gastroesophageal reflux disease)   . Shortness of breath   . Asthma   . Automatic implantable cardioverter-defibrillator in situ   . Stroke 12/2013    "my left side is paralyzed" (07/04/2014)  . Type II diabetes mellitus DX: 2002  . 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 Degeneration of lumbar or lumbosacral intervertebral disc   . Arthritis     "hips, back, legs, arms" (07/04/2014)  . Frequent UTI   . Liver disease    Past Surgical History  Procedure Laterality Date  . Tubal ligation  05/31/1985  . Carpal tunnel release Left   . Hemiarthroplasty shoulder fracture Right 1980's  . Multiple tooth extractions  ~ 2011    tumors removed ; "my whole top"  . Cardiac catheterization  05/2011  . Tee without cardioversion N/A 01/14/2014    Procedure: TRANSESOPHAGEAL ECHOCARDIOGRAM (TEE);  Surgeon: Thurmon Fair, MD;  Location: [facility] ENDOSCOPY;  Service: Cardiovascular;  Laterality: N/A;  . Breast surgery Bilateral 2011    patient reports benign results  . Bi-ventricular implantable cardioverter defibrillator  (crt-d)  08/2013    [name] 08/23/2013  . Bi-ventricular implantable cardioverter defibrillator N/A 08/22/2013    Procedure: BI-VENTRICULAR IMPLANTABLE CARDIOVERTER DEFIBRILLATOR  (CRT-D);  Surgeon: Salvatore Decent  Graciela Husbands, MD;  Location: [facility] CATH LAB;  Service: Cardiovascular;  Laterality: N/A;       Allergies  Allergen Reactions  . Clindamycin Anaphylaxis and Swelling  . Ace Inhibitors Swelling  . Enalapril Maleate Swelling  . Lisinopril Swelling    History   Social History  . Marital Status: Single    Spouse Name: N/A  . Number of Children: 2  . Years of Education: 9th grade   Occupational History  . Unemployed     planning on getting disability   Social History Main Topics  . Smoking status: Never  Smoker   . Smokeless tobacco: Never Used  . Alcohol Use: No  . Drug Use: No  . Sexual Activity:    Partners: Male    Birth Control/ Protection: Surgical   Other Topics Concern  . Not on file   Social History Narrative   Lives in [place] with her son. Is able to read and write fluently in [place].    Family History  Problem Relation Age of Onset  . Heart disease Mother 7    Died of MI at age 37 yo  . Heart disease Paternal Grandmother     requiring pacemaker.  [name] Heart disease Paternal Grandfather 90    Died of MI at possibly age 38-53yo  . Stroke Paternal Grandfather   . Heart disease Father 12    MI age 39yo requiring stenting  . Kidney disease Mother     requiring dialysis  . Congestive Heart Failure Mother   . Diabetes Father   . Diabetes Brother   . Heart disease Brother 14    MI at age 27 years old  . Breast cancer Paternal Aunt   . Breast cancer Maternal Grandmother   . Glaucoma Father     ROS- All systems are reviewed and negative except as per the HPI above  Physical Exam: Telemetry: Filed Vitals:   02/08/15 1015 02/08/15 1045 02/08/15 1330 02/08/15 1340  BP: 166/110 151/89 116/72 116/72  Pulse: 92 98 101 100  Temp:      TempSrc:      Resp: 25 17 14 15   Height:      Weight:      SpO2: 95% 99% 99% 99%    GEN- The patient is overweight appearing, alert and oriented x 3 today.   Head- normocephalic, atraumatic Eyes-  Sclera clear, conjunctiva pink Ears- hearing intact Oropharynx- clear Neck- supple,  Lungs- Clear to ausculation bilaterally, normal work of breathing Heart- Regular rate and rhythm  GI- soft, NT, ND, + BS Extremities- no clubbing, cyanosis, or edema MS- no significant deformity or atrophy Skin- no rash or lesion, device pocket is well healed Psych- euthymic mood, full affect Neuro- L lower facial droop, L sided weakness  EKG reveals sinus rhythm, V paced  Labs:   Lab Results  Component Value Date   WBC 9.3 02/08/2015    HGB 12.3 02/08/2015   HCT 38.2 02/08/2015   MCV 83.0 02/08/2015   PLT 213 02/08/2015    Recent Labs Lab 02/08/15 0857  NA 138  K 3.6  CL 104  CO2 26  BUN 7  CREATININE 0.95  CALCIUM 9.1  PROT 7.4  BILITOT 0.3  ALKPHOS 70  ALT 12  AST 19  GLUCOSE 222*   Lab Results  Component Value Date   CKTOTAL 65 12/20/2012   CKMB 2.4 11/23/2011   TROPONINI <0.30 10/14/2014    Lab Results  Component Value Date  CHOL 174 01/14/2015   CHOL 187 01/10/2014   CHOL 183 09/25/2013   Lab Results  Component Value Date   HDL 43 01/14/2015   HDL 43 01/10/2014   HDL 42 09/25/2013   Lab Results  Component Value Date   LDLCALC 106* 01/14/2015   LDLCALC 105* 01/10/2014   LDLCALC 108* 09/25/2013   Lab Results  Component Value Date   TRIG 124 01/14/2015   TRIG 194* 01/10/2014   TRIG 164* 09/25/2013   Lab Results  Component Value Date   CHOLHDL 4.0 01/14/2015   CHOLHDL 4.3 01/10/2014   CHOLHDL 4.4 09/25/2013   Lab Results  Component Value Date   LDLDIRECT 110.9 08/27/2011       ASSESSMENT AND PLAN:   1. Loss of consciousness I have reviewed her MDT [facility] on demand which reveals normal BiV ICD function.  She does not have any treated arrhythmias.  She did have elevated V rates around the time of her event which likely represent sinus tach.  My suspicion for a primary arrhythmia is low.  2. Nonischemic CM No ischemic changes Appears euvolemic No further inpatient workup planned  3. HTN Stable No change required today  Cardiology to see as needed Call with questions   Hillis Range, MD 02/08/2015  3:28 PM

## 2015-02-08 NOTE — Discharge Instructions (Signed)
Syncope °Syncope is a medical term for fainting or passing out. This means you lose consciousness and drop to the ground. People are generally unconscious for less than 5 minutes. You may have some muscle twitches for up to 15 seconds before waking up and returning to normal. Syncope occurs more often in older adults, but it can happen to anyone. While most causes of syncope are not dangerous, syncope can be a sign of a serious medical problem. It is important to seek medical care.  °CAUSES  °Syncope is caused by a sudden drop in blood flow to the brain. The specific cause is often not determined. Factors that can bring on syncope include: °· Taking medicines that lower blood pressure. °· Sudden changes in posture, such as standing up quickly. °· Taking more medicine than prescribed. °· Standing in one place for too long. °· Seizure disorders. °· Dehydration and excessive exposure to heat. °· Low blood sugar (hypoglycemia). °· Straining to have a bowel movement. °· Heart disease, irregular heartbeat, or other circulatory problems. °· Fear, emotional distress, seeing blood, or severe pain. °SYMPTOMS  °Right before fainting, you may: °· Feel dizzy or light-headed. °· Feel nauseous. °· See all white or all black in your field of vision. °· Have cold, clammy skin. °DIAGNOSIS  °Your health care provider will ask about your symptoms, perform a physical exam, and perform an electrocardiogram (ECG) to record the electrical activity of your heart. Your health care provider may also perform other heart or blood tests to determine the cause of your syncope which may include: °· Transthoracic echocardiogram (TTE). During echocardiography, sound waves are used to evaluate how blood flows through your heart. °· Transesophageal echocardiogram (TEE). °· Cardiac monitoring. This allows your health care provider to monitor your heart rate and rhythm in real time. °· Holter monitor. This is a portable device that records your  heartbeat and can help diagnose heart arrhythmias. It allows your health care provider to track your heart activity for several days, if needed. °· Stress tests by exercise or by giving medicine that makes the heart beat faster. °TREATMENT  °In most cases, no treatment is needed. Depending on the cause of your syncope, your health care provider may recommend changing or stopping some of your medicines. °HOME CARE INSTRUCTIONS °· Have someone stay with you until you feel stable. °· Do not drive, use machinery, or play sports until your health care provider says it is okay. °· Keep all follow-up appointments as directed by your health care provider. °· Lie down right away if you start feeling like you might faint. Breathe deeply and steadily. Wait until all the symptoms have passed. °· Drink enough fluids to keep your urine clear or pale yellow. °· If you are taking blood pressure or heart medicine, get up slowly and take several minutes to sit and then stand. This can reduce dizziness. °SEEK IMMEDIATE MEDICAL CARE IF:  °· You have a severe headache. °· You have unusual pain in the chest, abdomen, or back. °· You are bleeding from your mouth or rectum, or you have black or tarry stool. °· You have an irregular or very fast heartbeat. °· You have pain with breathing. °· You have repeated fainting or seizure-like jerking during an episode. °· You faint when sitting or lying down. °· You have confusion. °· You have trouble walking. °· You have severe weakness. °· You have vision problems. °If you fainted, call your local emergency services (911 in [place] Do not drive   yourself to the hospital.  °MAKE SURE YOU: °· Understand these instructions. °· Will watch your condition. °· Will get help right away if you are not doing well or get worse. °Document Released: 11/29/2005 Document Revised: 12/04/2013 Document Reviewed: 01/28/2012 °ExitCare® Patient Information ©2015 [employer]. This information is not intended to replace  advice given to you by your health care provider. Make sure you discuss any questions you have with your health care provider. ° °

## 2015-02-08 NOTE — ED Notes (Signed)
Assisted pt with getting dressed due to lt sided weakness

## 2015-02-08 NOTE — ED Notes (Signed)
Ct called for status of CT. States that pt should be next

## 2015-02-10 ENCOUNTER — Telehealth: Payer: Self-pay | Admitting: Neurology

## 2015-02-10 NOTE — Telephone Encounter (Signed)
Pt no showed 01/30/15 appt w/ Dr. Everlena Cooper. Phone was answered during automated reminder calls. No show letter + Medicaid no show policy mailed to pt / [name] S.

## 2015-02-12 ENCOUNTER — Other Ambulatory Visit: Payer: Self-pay | Admitting: *Deleted

## 2015-02-13 ENCOUNTER — Other Ambulatory Visit: Payer: Self-pay | Admitting: *Deleted

## 2015-02-13 ENCOUNTER — Other Ambulatory Visit: Payer: Self-pay

## 2015-02-13 MED ORDER — DICLOFENAC SODIUM 1 % TD GEL: 2.0000 g | Freq: Four times a day (QID) | TRANSDERMAL | Status: DC | PRN

## 2015-02-13 MED ORDER — METOPROLOL SUCCINATE ER 25 MG PO TB24: ORAL_TABLET | ORAL | Status: DC

## 2015-02-14 ENCOUNTER — Encounter: Payer: Self-pay | Admitting: *Deleted

## 2015-02-17 ENCOUNTER — Ambulatory Visit (INDEPENDENT_AMBULATORY_CARE_PROVIDER_SITE_OTHER): Payer: Medicaid Other | Admitting: Internal Medicine

## 2015-02-17 ENCOUNTER — Encounter (HOSPITAL_COMMUNITY): Payer: Self-pay

## 2015-02-17 ENCOUNTER — Ambulatory Visit (HOSPITAL_COMMUNITY)
Admission: RE | Admit: 2015-02-17 | Discharge: 2015-02-17 | Disposition: A | Discharge status: Home / Self Care | Payer: Medicaid Other | Source: Ambulatory Visit | Attending: Cardiology | Admitting: Cardiology

## 2015-02-17 VITALS — BP 136/87 | HR 88 | Temp 97.7°F | Resp 20 | Ht 68.0 in | Wt 259.4 lb

## 2015-02-17 VITALS — BP 126/84 | HR 89 | Wt 257.0 lb

## 2015-02-17 DIAGNOSIS — G8929 Other chronic pain: Secondary | ICD-10-CM | POA: Diagnosis not present

## 2015-02-17 DIAGNOSIS — E119 Type 2 diabetes mellitus without complications: Secondary | ICD-10-CM | POA: Insufficient documentation

## 2015-02-17 DIAGNOSIS — I429 Cardiomyopathy, unspecified: Secondary | ICD-10-CM | POA: Insufficient documentation

## 2015-02-17 DIAGNOSIS — Z8673 Personal history of transient ischemic attack (TIA), and cerebral infarction without residual deficits: Secondary | ICD-10-CM | POA: Diagnosis not present

## 2015-02-17 DIAGNOSIS — I2510 Atherosclerotic heart disease of native coronary artery without angina pectoris: Secondary | ICD-10-CM | POA: Insufficient documentation

## 2015-02-17 DIAGNOSIS — K219 Gastro-esophageal reflux disease without esophagitis: Secondary | ICD-10-CM | POA: Insufficient documentation

## 2015-02-17 DIAGNOSIS — E785 Hyperlipidemia, unspecified: Secondary | ICD-10-CM | POA: Insufficient documentation

## 2015-02-17 DIAGNOSIS — I5022 Chronic systolic (congestive) heart failure: Secondary | ICD-10-CM | POA: Diagnosis not present

## 2015-02-17 DIAGNOSIS — J45909 Unspecified asthma, uncomplicated: Secondary | ICD-10-CM | POA: Diagnosis not present

## 2015-02-17 DIAGNOSIS — M545 Low back pain: Secondary | ICD-10-CM | POA: Insufficient documentation

## 2015-02-17 DIAGNOSIS — Z0000 Encounter for general adult medical examination without abnormal findings: Secondary | ICD-10-CM

## 2015-02-17 DIAGNOSIS — Z7982 Long term (current) use of aspirin: Secondary | ICD-10-CM | POA: Diagnosis not present

## 2015-02-17 DIAGNOSIS — Z79899 Other long term (current) drug therapy: Secondary | ICD-10-CM | POA: Insufficient documentation

## 2015-02-17 DIAGNOSIS — H6120 Impacted cerumen, unspecified ear: Secondary | ICD-10-CM | POA: Insufficient documentation

## 2015-02-17 DIAGNOSIS — H6123 Impacted cerumen, bilateral: Secondary | ICD-10-CM

## 2015-02-17 DIAGNOSIS — R55 Syncope and collapse: Secondary | ICD-10-CM

## 2015-02-17 DIAGNOSIS — M5136 Other intervertebral disc degeneration, lumbar region: Secondary | ICD-10-CM | POA: Diagnosis not present

## 2015-02-17 DIAGNOSIS — I10 Essential (primary) hypertension: Secondary | ICD-10-CM

## 2015-02-17 DIAGNOSIS — G4733 Obstructive sleep apnea (adult) (pediatric): Secondary | ICD-10-CM | POA: Insufficient documentation

## 2015-02-17 DIAGNOSIS — I5042 Chronic combined systolic (congestive) and diastolic (congestive) heart failure: Secondary | ICD-10-CM

## 2015-02-17 DIAGNOSIS — I5032 Chronic diastolic (congestive) heart failure: Secondary | ICD-10-CM

## 2015-02-17 DIAGNOSIS — I6309 Cerebral infarction due to thrombosis of other precerebral artery: Secondary | ICD-10-CM

## 2015-02-17 LAB — GLUCOSE, CAPILLARY: Glucose-Capillary: 166 mg/dL — ABNORMAL HIGH (ref 70–99)

## 2015-02-17 MED ORDER — FUROSEMIDE 40 MG PO TABS: 40.0000 mg | ORAL_TABLET | Freq: Every day | ORAL | Status: DC

## 2015-02-17 MED ORDER — HYDRALAZINE HCL 50 MG PO TABS: 75.0000 mg | ORAL_TABLET | Freq: Three times a day (TID) | ORAL | Status: DC

## 2015-02-17 NOTE — Assessment & Plan Note (Addendum)
No further syncopal episodes. Previous episode thought due to dehydration. Pt tying to stay hydrated but not drink too much fluids in setting of CHF.  - Pt advised to call the clinic or return to the ED if she has another episode.

## 2015-02-17 NOTE — Progress Notes (Signed)
Patient ID: [name], female   DOB: 12-21-1965, 49 y.o.   MRN: [id number]  Subjective:   Patient ID: [name] female   DOB: [birth year] 49 y.o.   MRN: [id number]  HPI: [name] is a 49 y.o. F w/ PMH nonischemic cardiomyopathy status post AICD placement, hypertension, hyperlipidemia, diabetes, morbid obesity, and stroke with residual left-sided weakness presents for an ED f/u.   She was seen in the ED 2/27 for a syncopal episode. Cardiology was asked to see that patient and interrogate her AICD, which did not demonstrate arrhythmia. Her syncope was thought to be 2/2 dehydration. She was encouraged to stay hydrated and asked to f/u in the clinic this week.   Today she is doing well and denies any further episodes of syncope.   She was seen by Dr. Aundra Dubin in the [place] clinic today. Her BP was 126/84. Her Lasix was restarted at $RemoveBefo'40mg'ZitafcvbkXu$  daily due to weight gain (4lbs) and Hydralazine was increased to $RemoveBefo'75mg'XFgQKXEMaag$  TID.    Past Medical History  Diagnosis Date  . Hypertension     Poorly controlled. Has had HTN since age 15. Angioedema with ACEI.  24 Hr urine and renal arterial dopplers ordered . . . Never done  . NICM (nonischemic cardiomyopathy)     EF 45-50% in 8/12, cath 6/12 showed normal coronaries, EF 50-55% by LV gram  . Morbid obesity   . Allergic rhinitis   . HLD (hyperlipidemia)   . OSA on CPAP     sleep study in 8/12 showed moderate to severe OSA requiring CPAP  . Chronic lower back pain     secondary to DJD, obsetiy, hip problems. Followed by Dr. Oval Linsey (pain management)  . Chronic diastolic heart failure      Primarily diastolic CHF: Likely due to uncontrolled HTN. Last echo (8/12) with EF 45-50%, mild to moderate LVH with some asymmetric septal hypertrophy, RV normal size and systolic function. EF 50-55% by LV-gram in 6/12.   . Polyneuropathy in diabetes(357.2)   . Thoracic or lumbosacral neuritis or radiculitis, unspecified   . Calcifying tendinitis of shoulder    . LBBB (left bundle branch block)   . Chronic combined systolic and diastolic CHF (congestive heart failure)     EF 40-45% by echo 12/06/2012  . Coronary artery disease     questionable. LHC 05/2011 showing normal coronaries // Followed at [facility], Dr. Aundra Dubin  . GERD (gastroesophageal reflux disease)   . Shortness of breath   . Asthma   . Automatic implantable cardioverter-defibrillator in situ   . Stroke 12/2013    "my left side is paralyzed" (07/04/2014)  . Type II diabetes mellitus DX: 2002  . 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 Degeneration of lumbar or lumbosacral intervertebral disc   . Arthritis     "hips, back, legs, arms" (07/04/2014)  . Frequent UTI   . Liver disease    Current Outpatient Prescriptions  Medication Sig Dispense Refill  . amLODipine (NORVASC) 5 MG tablet Take 1 tablet (5 mg total) by mouth daily. 30 tablet 6  . aspirin 325 MG tablet Take 1 tablet (325 mg total) by mouth daily. 30 tablet 5  . Blood Glucose Monitoring Suppl (ACCU-CHEK AVIVA PLUS) W/DEVICE KIT 1 each by Does not apply route 3 (three) times daily. 1 kit 0  . citalopram (CELEXA) 20 MG tablet Take 1 tablet (20 mg total) by mouth daily. 30 tablet 5  .  cyclobenzaprine (FLEXERIL) 5 MG tablet Take 1 tablet (5 mg total) by mouth 3 (three) times daily as needed for muscle spasms. 30 tablet 1  . diclofenac sodium (VOLTAREN) 1 % GEL Apply 2 g topically 4 (four) times daily as needed (pain). 3 Tube 5  . digoxin (LANOXIN) 0.125 MG tablet Take 0.125 mg by mouth daily.    . fluticasone (CUTIVATE) 0.05 % cream Apply topically 2 (two) times daily. Dispense two tubes 30 g 6  . furosemide (LASIX) 40 MG tablet Take 1 tablet (40 mg total) by mouth daily. 90 tablet 3  . glucose blood (ACCU-CHEK SMARTVIEW) test strip Use to Check Blood Sugar TID. Code: E11.40. 100 each 12  . hydrALAZINE (APRESOLINE) 50 MG tablet Take 1.5 tablets (75 mg total) by mouth 3 (three) times daily. 270 tablet 3   . Incontinence Supplies MISC 1 year supply of Pull-ups in size XL Diagnosis Code: 788.33 1 each 0  . Insulin Glargine (LANTUS SOLOSTAR) 100 UNIT/ML Solostar Pen Inject 10 units in the AM, 35 units in the PM 15 mL 11  . Insulin Pen Needle (GLOBAL EASE INJECT PEN NEEDLES) 31G X 5 MM MISC Use to inject insulin BID. Code: E 11.40 100 each 10  . ipratropium (ATROVENT) 0.06 % nasal spray Place 2 sprays into both nostrils 4 (four) times daily. (Patient taking differently: Place 2 sprays into both nostrils daily as needed for rhinitis. ) 15 mL 1  . isosorbide mononitrate (IMDUR) 60 MG 24 hr tablet Take 1 tablet (60 mg total) by mouth every morning. 30 tablet 11  . levETIRAcetam (KEPPRA) 500 MG tablet Take 1 tablet (500 mg total) by mouth 2 (two) times daily. 60 tablet 3  . metFORMIN (GLUCOPHAGE) 1000 MG tablet Take 1,000 mg by mouth daily.     . metoprolol succinate (TOPROL-XL) 25 MG 24 hr tablet Take 50 mg (2 tablets) twice daily. 180 tablet 1  . nystatin cream (MYCOSTATIN) Apply 1 application topically 2 (two) times daily. (Patient taking differently: Apply 1 application topically daily as needed for dry skin. ) 30 g 0  . oxyCODONE-acetaminophen (PERCOCET) 10-325 MG per tablet Take 1 tablet by mouth every 8 (eight) hours as needed for pain. 90 tablet 0  . pantoprazole (PROTONIX) 40 MG tablet Take 40 mg by mouth daily.    . potassium chloride 20 MEQ TBCR Take 20 mEq by mouth daily. 90 tablet 3  . simvastatin (ZOCOR) 40 MG tablet Take 40 mg by mouth at bedtime.     . Vitamin D, Ergocalciferol, (DRISDOL) 50000 UNITS CAPS capsule Take 1 capsule (50,000 Units total) by mouth every 7 (seven) days. 24 capsule 0   No current facility-administered medications for this visit.   Family History  Problem Relation Age of Onset  . Heart disease Mother 24    Died of MI at age 61 yo  . Heart disease Paternal Grandmother     requiring pacemaker.  [name] Heart disease Paternal Grandfather 101    Died of MI at possibly  age 2-53yo  . Stroke Paternal Grandfather   . Heart disease Father 54    MI age 42yo requiring stenting  . Kidney disease Mother     requiring dialysis  . Congestive Heart Failure Mother   . Diabetes Father   . Diabetes Brother   . Heart disease Brother 69    MI at age 19 years old  . Breast cancer Paternal Aunt   . Breast cancer Maternal Grandmother   . Glaucoma  Father    History   Social History  . Marital Status: Single    Spouse Name: N/A  . Number of Children: 2  . Years of Education: 9th grade   Occupational History  . Unemployed     planning on getting disability   Social History Main Topics  . Smoking status: Never Smoker   . Smokeless tobacco: Never Used  . Alcohol Use: No  . Drug Use: No  . Sexual Activity:    Partners: Male    Birth Control/ Protection: Surgical   Other Topics Concern  . Not on file   Social History Narrative   Lives in [place] with her son. Is able to read and write fluently in [place].   Review of Systems: Constitutional: Denies fever, chills, appetite change, or fatigue.  HEENT: Denies vision changes Respiratory: Denies SOB, DOE Cardiovascular: Denies chest pain, palpitations, or leg swelling.  Gastrointestinal: Denies nausea, vomiting, abdominal pain, diarrhea, constipation Genitourinary: Denies dysuria, urgency, frequency Musculoskeletal: +L knee pain and pain with ambulating Skin: Denies rash or wound.  Neurological: Denies dizziness, seizures, syncope, weakness. +headaches, left frontal  Psychiatric/Behavioral: Denies suicidal ideation, mood changes, confusion.  Objective:  Physical Exam: Filed Vitals:   02/17/15 1420  BP: 136/87  Pulse: 88  Temp: 97.7 F (36.5 C)  TempSrc: Oral  Resp: 20  Height: $Remove'5\' 8"'uwRXWPN$  (1.727 m)  Weight: 259 lb 6.4 oz (117.663 kg)  SpO2: 100%   Constitutional: Vital signs reviewed.  Patient is a well-developed and well-nourished female in no acute distress and cooperative with exam. Alert and  oriented x3.  Head: Normocephalic and atraumatic Ear: TM not visualized due to cerumen L>R Eyes: PERRL, EOMI, conjunctivae normal, No scleral icterus.  Neck: Normal ROM, Cardiovascular: RRR, no MRG Pulmonary/Chest: Normal respiratory effort, CTAB, no wheezes, rales, or rhonchi Abdominal: Soft. Non-tender, non-distended, hypoactive bowel sounds Neurological: A&O x3. Left sided facial droop, left sided weakness, all residual from srtoke Skin: Warm, dry and intact.   Psychiatric: Normal mood and affect. Speech and behavior is normal.   Assessment & Plan:   Please refer to Problem List based Assessment and Plan

## 2015-02-17 NOTE — Assessment & Plan Note (Addendum)
She is due for a pap smear and pelvic exam, Tdap, urine microalbumin/Cr and diabetic foot exam.  - Defer to PCP for pelvic exam and pap smear - Obtain urine microalbumin/Cr - Pt states that she had her diabetic foot exam at her last visit in Feb - Tdap given today - She is due for pneumococcal vaccine given co-morbidities, pt declined until her next visit

## 2015-02-17 NOTE — Progress Notes (Signed)
Patient ID: [name], female   DOB: 27-Jun-1966, 49 y.o.   MRN: [id number]  Weight Range  282 pounds  Baseline proBNP   EP: Dr Caryl Comes PCP: Dr [facility]  Angioedema with ACE-I  --Optivol--  HPI: 49 yo with history of morbid obesity, HTN, HLD, CAD, DM2, OSA on CPAP, CVA and combined CHF in the setting of poorly-controlled HTN. She was admitted in 05/2011 with chest pain and new LBBB and ended up getting a left heart cath, which showed no angiographic CAD. Renal artery dopplers were normal in 04/2012.  She has a Medtronic CRT-D device.   Was hospitalized May 2014 with acute on chronic combined systolic and diastolic CHF. EF from December 2013 was down to 40 to 45%.   Admitted in January 2015 with slurred speech, L arm weakness and facial droop. Initial head CT showed no acute abnormality. Repeat head CT showed subtle gray white matter differentiation in R frontal lobe and R operculum perhaps representing ischemic R sided MCA. STAT repeat CT head later on 1/29 showed further evolution of R MCA territory infarct involving R insular cortex and operculum. No hemorrhage. Neurology recommended CTA head/neck, which showed occlusion of MCA branch to the area of R MCA infarct and a normal neck CTA. TEE was done and did not reveal embolic source. Was discharged to inpatient rehab.  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  She was started on Keppra.  She went to the ER in 2/16 with syncope that was probably due to seizure, no arrhythmias with device interrogation.  Stable symptomatically.  Not walking much but denies dyspnea.  She can lie flat without problems.  She does have abdominal distention and wonders if she is retaining fluid.  She has left knee OA and walks with a cane.  She has left-sided weakness residual from her CVA.  No chest pain.  No lightheadedness or palpitations.  She takes Lasix prn.   Optivol: No atrial fibrillation, not very  active, thoracic impedance decreasing and fluid index > threshold.     ECHO 11/2012: 40-45% ECHO  07/2013: 20% RV normal ECHO    08/23/2013: EF 40-45%, diff HK ECHO   09/05/2013: EF 25-30%, diff HK  ECHO  12/11/13: EF 30%, diff HK, RV normal ECHO 01/14/2014: EF 35-40%, LA mod dilated   Labs 06/2013: proBNP 230, Cr 0.9, K 3.7          08/2013: K+ 3.9, Cr 0.9       09/2013: K 4.2, Cr 0.86, pro-BNP 718, dig level <0.3     10/07/13: K+ 4.1, Cr 0.83       05/08/14: K 4.0 Creatinine 0.67 Pro BNP 233 Dig level 0.3        08/20/2014 K 3.8 nCreatinine 0.56        2/16: K 3.6, creatinine 0.95, LDL 106, HDL 43  ROS: All systems negative except as listed in HPI, PMH and Problem List.  Past Medical History  Diagnosis Date  . Hypertension     Poorly controlled. Has had HTN since age 53. Angioedema with ACEI.  24 Hr urine and renal arterial dopplers ordered . . . Never done  . NICM (nonischemic cardiomyopathy)     EF 45-50% in 8/12, cath 6/12 showed normal coronaries, EF 50-55% by LV gram  . Morbid obesity   . Allergic rhinitis   . HLD (hyperlipidemia)   . OSA on CPAP     sleep  study in 8/12 showed moderate to severe OSA requiring CPAP  . Chronic lower back pain     secondary to DJD, obsetiy, hip problems. Followed by Dr. Oval Linsey (pain management)  . Chronic diastolic heart failure      Primarily diastolic CHF: Likely due to uncontrolled HTN. Last echo (8/12) with EF 45-50%, mild to moderate LVH with some asymmetric septal hypertrophy, RV normal size and systolic function. EF 50-55% by LV-gram in 6/12.   . Polyneuropathy in diabetes(357.2)   . Thoracic or lumbosacral neuritis or radiculitis, unspecified   . Calcifying tendinitis of shoulder   . LBBB (left bundle branch block)   . Chronic combined systolic and diastolic CHF (congestive heart failure)     EF 40-45% by echo 12/06/2012  . Coronary artery disease     questionable. LHC 05/2011 showing normal coronaries // Followed at [facility]  Cardiology, Dr. Aundra Dubin  . GERD (gastroesophageal reflux disease)   . Shortness of breath   . Asthma   . Automatic implantable cardioverter-defibrillator in situ   . Stroke 12/2013    "my left side is paralyzed" (07/04/2014)  . Type II diabetes mellitus DX: 2002  . 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 Degeneration of lumbar or lumbosacral intervertebral disc   . Arthritis     "hips, back, legs, arms" (07/04/2014)  . Frequent UTI   . Liver disease     Current Outpatient Prescriptions  Medication Sig Dispense Refill  . amLODipine (NORVASC) 5 MG tablet Take 1 tablet (5 mg total) by mouth daily. 30 tablet 6  . aspirin 325 MG tablet Take 1 tablet (325 mg total) by mouth daily. 30 tablet 5  . Blood Glucose Monitoring Suppl (ACCU-CHEK AVIVA PLUS) W/DEVICE KIT 1 each by Does not apply route 3 (three) times daily. 1 kit 0  . citalopram (CELEXA) 20 MG tablet Take 1 tablet (20 mg total) by mouth daily. 30 tablet 5  . cyclobenzaprine (FLEXERIL) 5 MG tablet Take 1 tablet (5 mg total) by mouth 3 (three) times daily as needed for muscle spasms. 30 tablet 1  . diclofenac sodium (VOLTAREN) 1 % GEL Apply 2 g topically 4 (four) times daily as needed (pain). 3 Tube 5  . digoxin (LANOXIN) 0.125 MG tablet Take 0.125 mg by mouth daily.    . fluticasone (CUTIVATE) 0.05 % cream Apply topically 2 (two) times daily. Dispense two tubes 30 g 6  . furosemide (LASIX) 40 MG tablet Take 1 tablet (40 mg total) by mouth daily. 90 tablet 3  . glucose blood (ACCU-CHEK SMARTVIEW) test strip Use to Check Blood Sugar TID. Code: E11.40. 100 each 12  . Incontinence Supplies MISC 1 year supply of Pull-ups in size XL Diagnosis Code: 788.33 1 each 0  . Insulin Glargine (LANTUS SOLOSTAR) 100 UNIT/ML Solostar Pen Inject 10 units in the AM, 35 units in the PM 15 mL 11  . Insulin Pen Needle (GLOBAL EASE INJECT PEN NEEDLES) 31G X 5 MM MISC Use to inject insulin BID. Code: E 11.40 100 each 10  . ipratropium  (ATROVENT) 0.06 % nasal spray Place 2 sprays into both nostrils 4 (four) times daily. (Patient taking differently: Place 2 sprays into both nostrils daily as needed for rhinitis. ) 15 mL 1  . isosorbide mononitrate (IMDUR) 60 MG 24 hr tablet Take 1 tablet (60 mg total) by mouth every morning. 30 tablet 11  . levETIRAcetam (KEPPRA) 500 MG tablet Take 1 tablet (500 mg  total) by mouth 2 (two) times daily. 60 tablet 3  . metFORMIN (GLUCOPHAGE) 1000 MG tablet Take 1,000 mg by mouth daily.     . metoprolol succinate (TOPROL-XL) 25 MG 24 hr tablet Take 50 mg (2 tablets) twice daily. 180 tablet 1  . nystatin cream (MYCOSTATIN) Apply 1 application topically 2 (two) times daily. (Patient taking differently: Apply 1 application topically daily as needed for dry skin. ) 30 g 0  . oxyCODONE-acetaminophen (PERCOCET) 10-325 MG per tablet Take 1 tablet by mouth every 8 (eight) hours as needed for pain. 90 tablet 0  . pantoprazole (PROTONIX) 40 MG tablet Take 40 mg by mouth daily.    . potassium chloride 20 MEQ TBCR Take 20 mEq by mouth daily. 90 tablet 3  . simvastatin (ZOCOR) 40 MG tablet Take 40 mg by mouth at bedtime.     . Vitamin D, Ergocalciferol, (DRISDOL) 50000 UNITS CAPS capsule Take 1 capsule (50,000 Units total) by mouth every 7 (seven) days. 24 capsule 0  . hydrALAZINE (APRESOLINE) 50 MG tablet Take 1.5 tablets (75 mg total) by mouth 3 (three) times daily. 270 tablet 3   No current facility-administered medications for this encounter.   Filed Vitals:   02/17/15 1116  BP: 126/84  Pulse: 89  Weight: 257 lb (116.574 kg)  SpO2: 99%   PHYSICAL EXAM: General:  NAD. No resp difficulty; son and grand-daughters present.  HEENT: normal Neck: supple. Thick. JVP 8-9 cm. Carotids 2+ bilaterally; no bruits. No lymphadenopathy appreciated.  Cor: PMI nonpalpable; Regular rate & rhythm. No rubs, gallops or murmurs. +S3.  Lungs: clear Abdomen: obese soft, nontender, mildly distended. No hepatosplenomegaly. No  bruits or masses. Good bowel sounds. Extremities: no cyanosis, clubbing, rash, trace ankle edema.  Neuro: alert & orientedx3, cranial nerves grossly intact. Moves all 4 extremities w/o difficulty. Affect pleasant.  ASSESSMENT: 1. Chronic systolic HF: Nonischemic cardiomyopathy, normal coronaries on 6/12 angiography.CMP may be due to HTN.  Last echo with EF 35-40% (01/2014) s/p CRT-D Medtronic.  NYHA class II symptoms but some volume overload on exam and Optivol suggests volume overload as well.  - Increase Lasix to 40 mg daily with KCl 20 daily.  Check BMET/BNP in 1 week.  - Will continue Toprol 50 mg twice a day.  - Increase hydralazine to 75 mg tid, continue Imdur 60 daily.  - Continue digoxin, check level.  - No  ACE-I due to angioedema.  Will consider ARB in future, risk of cross-reaction for angioedema is not high.  - I will arrange for echocardiogram.    - Reinforced the need and importance of daily weights, a low sodium diet, and fluid restriction (less than 2 L a day). Instructed to call the HF clinic if weight increases more than 3 lbs overnight or 5 lbs in a week.  2. HTN: BP ok today, increasing hydralazine as above.  3. Morbid obesity:  Encouraged weight loss with portion control. Asked patient to try and be more active.  4. OSA on CPAP: Uses intermittently. Encouraged to use nightly.  5. CVA: No atrial fibrillation has been noted on device monitoring.  Continue ASA, no indication at this point for anticoagulation.   Loralie Champagne 02/17/2015

## 2015-02-17 NOTE — Assessment & Plan Note (Signed)
Cerumen in both ears, completely obstructing auditory canal in left ear.  - Pt to apply a small amount of mineral oil to each ear daily - She is to return to the clinic in 2 weeks for irrigation of her ear if needed

## 2015-02-17 NOTE — Patient Instructions (Signed)
INCREASE Lasix to 40mg  (1 tablet) once every day.  INCREASE Hydralazine to 75mg  (1.5 tablets) three times daily.  Return in 1-2 weeks for lab work.  Follow up 1 month with echocardiogram.  Do the following things EVERYDAY: 1) Weigh yourself in the morning before breakfast. Write it down and keep it in a log. 2) Take your medicines as prescribed 3) Eat low salt foods-Limit salt (sodium) to 2000 mg per day.  4) Stay as active as you can everyday 5) Limit all fluids for the day to less than 2 liters

## 2015-02-17 NOTE — Patient Instructions (Signed)
Be sure to go to your appointment with Dr. Riley Kill for your knee pain on 03/12/15.   Return to the clinic in May for your appointment with Dr. Yetta Barre.     General Instructions:   Please bring your medicines with you each time you come to clinic.  Medicines may include prescription medications, over-the-counter medications, herbal remedies, eye drops, vitamins, or other pills.   Progress Toward Treatment Goals:  Treatment Goal 01/14/2015  Hemoglobin A1C unchanged  Blood pressure unchanged  Prevent falls -    Self Care Goals & Plans:  Self Care Goal 02/17/2015  Manage my medications take my medicines as prescribed; bring my medications to every visit; refill my medications on time  Monitor my health keep track of my blood glucose; bring my glucose meter and log to each visit  Eat healthy foods eat baked foods instead of fried foods; eat foods that are low in salt; eat fruit for snacks and desserts; drink diet soda or water instead of juice or soda  Be physically active -  Meeting treatment goals -    Home Blood Glucose Monitoring 01/14/2015  Check my blood sugar 2 times a day  When to check my blood sugar before breakfast; at bedtime     Care Management & Community Referrals:  Referral 01/14/2015  Referrals made for care management support none needed  Referrals made to community resources none

## 2015-02-18 LAB — MICROALBUMIN / CREATININE URINE RATIO
Creatinine, Urine: 374.9 mg/dL
Microalb Creat Ratio: 5.1 mg/g (ref 0.0–30.0)
Microalb, Ur: 1.9 mg/dL (ref <2.0)

## 2015-02-18 NOTE — Progress Notes (Signed)
Internal Medicine Clinic Attending  Case discussed with Dr. Glenn soon after the resident saw the patient.  We reviewed the resident's history and exam and pertinent patient test results.  I agree with the assessment, diagnosis, and plan of care documented in the resident's note. 

## 2015-02-19 MED ORDER — VITAMIN D (ERGOCALCIFEROL) 1.25 MG (50000 UNIT) PO CAPS: 50000.0000 [IU] | ORAL_CAPSULE | ORAL | Status: DC

## 2015-02-23 ENCOUNTER — Other Ambulatory Visit: Payer: Self-pay | Admitting: Internal Medicine

## 2015-02-24 ENCOUNTER — Other Ambulatory Visit: Payer: Self-pay | Admitting: *Deleted

## 2015-02-25 MED ORDER — FLUTICASONE PROPIONATE 0.05 % EX CREA: TOPICAL_CREAM | Freq: Two times a day (BID) | CUTANEOUS | Status: DC

## 2015-02-25 MED ORDER — DIGOXIN 125 MCG PO TABS: 0.1250 mg | ORAL_TABLET | Freq: Every day | ORAL | Status: DC

## 2015-02-26 ENCOUNTER — Ambulatory Visit (HOSPITAL_COMMUNITY)
Admission: RE | Admit: 2015-02-26 | Discharge: 2015-02-26 | Disposition: A | Discharge status: Home / Self Care | Payer: Medicaid Other | Source: Ambulatory Visit | Attending: Internal Medicine | Admitting: Internal Medicine

## 2015-02-26 DIAGNOSIS — I5032 Chronic diastolic (congestive) heart failure: Secondary | ICD-10-CM | POA: Diagnosis present

## 2015-02-26 DIAGNOSIS — I5022 Chronic systolic (congestive) heart failure: Secondary | ICD-10-CM | POA: Diagnosis not present

## 2015-02-26 LAB — BASIC METABOLIC PANEL
Anion gap: 9 (ref 5–15)
BUN: 6 mg/dL (ref 6–23)
CO2: 27 mmol/L (ref 19–32)
Calcium: 9.3 mg/dL (ref 8.4–10.5)
Chloride: 102 mmol/L (ref 96–112)
Creatinine, Ser: 0.77 mg/dL (ref 0.50–1.10)
GFR calc Af Amer: >90 mL/min (ref >90)
GLUCOSE: 203 mg/dL — AB (ref 70–99)
Potassium: 4 mmol/L (ref 3.5–5.1)
Sodium: 138 mmol/L (ref 135–145)

## 2015-02-26 LAB — BRAIN NATRIURETIC PEPTIDE: B Natriuretic Peptide: 9.2 pg/mL (ref 0.0–100.0)

## 2015-02-28 ENCOUNTER — Telehealth (HOSPITAL_COMMUNITY): Payer: Self-pay | Admitting: Vascular Surgery

## 2015-02-28 NOTE — Telephone Encounter (Signed)
Demetra Shiner from [facility] [employer] [facility] called they are going to have to remove the tel monitoring pt has been staying in a hotel.. Please advise

## 2015-03-05 ENCOUNTER — Emergency Department (HOSPITAL_COMMUNITY)
Admission: EM | Admit: 2015-03-05 | Discharge: 2015-03-05 | Disposition: A | Discharge status: Home / Self Care | Payer: Medicaid Other | Attending: Emergency Medicine | Admitting: Emergency Medicine

## 2015-03-05 ENCOUNTER — Encounter (HOSPITAL_COMMUNITY): Payer: Self-pay | Admitting: *Deleted

## 2015-03-05 ENCOUNTER — Emergency Department (HOSPITAL_COMMUNITY): Payer: Medicaid Other

## 2015-03-05 DIAGNOSIS — E785 Hyperlipidemia, unspecified: Secondary | ICD-10-CM | POA: Diagnosis not present

## 2015-03-05 DIAGNOSIS — I5042 Chronic combined systolic (congestive) and diastolic (congestive) heart failure: Secondary | ICD-10-CM | POA: Insufficient documentation

## 2015-03-05 DIAGNOSIS — G4733 Obstructive sleep apnea (adult) (pediatric): Secondary | ICD-10-CM | POA: Diagnosis not present

## 2015-03-05 DIAGNOSIS — E1140 Type 2 diabetes mellitus with diabetic neuropathy, unspecified: Secondary | ICD-10-CM | POA: Diagnosis not present

## 2015-03-05 DIAGNOSIS — I10 Essential (primary) hypertension: Secondary | ICD-10-CM | POA: Insufficient documentation

## 2015-03-05 DIAGNOSIS — J45909 Unspecified asthma, uncomplicated: Secondary | ICD-10-CM | POA: Diagnosis not present

## 2015-03-05 DIAGNOSIS — Z8673 Personal history of transient ischemic attack (TIA), and cerebral infarction without residual deficits: Secondary | ICD-10-CM | POA: Insufficient documentation

## 2015-03-05 DIAGNOSIS — G8929 Other chronic pain: Secondary | ICD-10-CM | POA: Diagnosis not present

## 2015-03-05 DIAGNOSIS — Z9981 Dependence on supplemental oxygen: Secondary | ICD-10-CM | POA: Insufficient documentation

## 2015-03-05 DIAGNOSIS — Z95810 Presence of automatic (implantable) cardiac defibrillator: Secondary | ICD-10-CM | POA: Diagnosis not present

## 2015-03-05 DIAGNOSIS — Z79899 Other long term (current) drug therapy: Secondary | ICD-10-CM | POA: Insufficient documentation

## 2015-03-05 DIAGNOSIS — K219 Gastro-esophageal reflux disease without esophagitis: Secondary | ICD-10-CM | POA: Insufficient documentation

## 2015-03-05 DIAGNOSIS — R42 Dizziness and giddiness: Secondary | ICD-10-CM | POA: Insufficient documentation

## 2015-03-05 DIAGNOSIS — R55 Syncope and collapse: Secondary | ICD-10-CM | POA: Diagnosis present

## 2015-03-05 DIAGNOSIS — Z7982 Long term (current) use of aspirin: Secondary | ICD-10-CM | POA: Diagnosis not present

## 2015-03-05 DIAGNOSIS — I2510 Atherosclerotic heart disease of native coronary artery without angina pectoris: Secondary | ICD-10-CM | POA: Diagnosis not present

## 2015-03-05 DIAGNOSIS — Z794 Long term (current) use of insulin: Secondary | ICD-10-CM | POA: Diagnosis not present

## 2015-03-05 DIAGNOSIS — Z9889 Other specified postprocedural states: Secondary | ICD-10-CM | POA: Insufficient documentation

## 2015-03-05 DIAGNOSIS — Z87440 Personal history of urinary (tract) infections: Secondary | ICD-10-CM | POA: Diagnosis not present

## 2015-03-05 DIAGNOSIS — M159 Polyosteoarthritis, unspecified: Secondary | ICD-10-CM | POA: Diagnosis not present

## 2015-03-05 LAB — CBC
HCT: 35.9 % — ABNORMAL LOW (ref 36.0–46.0)
HEMOGLOBIN: 11.8 g/dL — AB (ref 12.0–15.0)
MCH: 27.3 pg (ref 26.0–34.0)
MCHC: 32.9 g/dL (ref 30.0–36.0)
MCV: 83.1 fL (ref 78.0–100.0)
Platelets: 258 10*3/uL (ref 150–400)
RBC: 4.32 MIL/uL (ref 3.87–5.11)
RDW: 12.8 % (ref 11.5–15.5)
WBC: 14.8 10*3/uL — ABNORMAL HIGH (ref 4.0–10.5)

## 2015-03-05 LAB — BASIC METABOLIC PANEL
Anion gap: 7 (ref 5–15)
BUN: 11 mg/dL (ref 6–23)
CALCIUM: 8.9 mg/dL (ref 8.4–10.5)
CO2: 29 mmol/L (ref 19–32)
Chloride: 100 mmol/L (ref 96–112)
Creatinine, Ser: 0.87 mg/dL (ref 0.50–1.10)
GFR calc Af Amer: 90 mL/min — ABNORMAL LOW (ref >90)
GFR calc non Af Amer: 78 mL/min — ABNORMAL LOW (ref >90)
GLUCOSE: 232 mg/dL — AB (ref 70–99)
Potassium: 3.5 mmol/L (ref 3.5–5.1)
Sodium: 136 mmol/L (ref 135–145)

## 2015-03-05 LAB — I-STAT TROPONIN, ED: Troponin i, poc: 0 ng/mL (ref 0.00–0.08)

## 2015-03-05 NOTE — ED Notes (Signed)
Pt reports she began today with substernal chest pain. Reports multiple stressors contributing which include currently moving and living in a hotel. Pt reports generalized weakness. Hx of stroke affecting left side. Pt currently resting in bed sleeping.

## 2015-03-05 NOTE — ED Provider Notes (Signed)
CSN: [id number]     Arrival date & time 03/05/15  1308 History   First MD Initiated Contact with Patient 03/05/15 1350     Chief Complaint  Patient presents with  . Chest Pain     (Consider location/radiation/quality/duration/timing/severity/associated sxs/prior Treatment) HPI Comments: Patient is a 49 year old female who presents for evaluation of a near syncopal episode. She states that while walking out of the house, she began to feel lightheaded and felt as if she was going to pass out. She thought maybe her sugar was low and drank some grape juice which seemed to resolve her symptoms. She now feels fine and has no complaints. She denies any chest pain, difficulty breathing, abdominal pain.  Patient is a 49 y.o. female presenting with near-syncope. The history is provided by the patient.  Near Syncope This is a new problem. The current episode started 1 to 2 hours ago. The problem occurs constantly. The problem has been resolved. Pertinent negatives include no chest pain. Nothing aggravates the symptoms. Nothing relieves the symptoms. Treatments tried: Grape juice. The treatment provided significant relief.    Past Medical History  Diagnosis Date  . Hypertension     Poorly controlled. Has had HTN since age 14. Angioedema with ACEI.  24 Hr urine and renal arterial dopplers ordered . . . Never done  . NICM (nonischemic cardiomyopathy)     EF 45-50% in 8/12, cath 6/12 showed normal coronaries, EF 50-55% by LV gram  . Morbid obesity   . Allergic rhinitis   . HLD (hyperlipidemia)   . OSA on CPAP     sleep study in 8/12 showed moderate to severe OSA requiring CPAP  . Chronic lower back pain     secondary to DJD, obsetiy, hip problems. Followed by Dr. Oval Linsey (pain management)  . Chronic diastolic heart failure      Primarily diastolic CHF: Likely due to uncontrolled HTN. Last echo (8/12) with EF 45-50%, mild to moderate LVH with some asymmetric septal hypertrophy, RV normal size and  systolic function. EF 50-55% by LV-gram in 6/12.   . Polyneuropathy in diabetes(357.2)   . Thoracic or lumbosacral neuritis or radiculitis, unspecified   . Calcifying tendinitis of shoulder   . LBBB (left bundle branch block)   . Chronic combined systolic and diastolic CHF (congestive heart failure)     EF 40-45% by echo 12/06/2012  . Coronary artery disease     questionable. LHC 05/2011 showing normal coronaries // Followed at [facility], Dr. Aundra Dubin  . GERD (gastroesophageal reflux disease)   . Shortness of breath   . Asthma   . Automatic implantable cardioverter-defibrillator in situ   . Stroke 12/2013    "my left side is paralyzed" (07/04/2014)  . Type II diabetes mellitus DX: 2002  . 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 Degeneration of lumbar or lumbosacral intervertebral disc   . Arthritis     "hips, back, legs, arms" (07/04/2014)  . Frequent UTI   . Liver disease    Past Surgical History  Procedure Laterality Date  . Tubal ligation  05/31/1985  . Carpal tunnel release Left   . Hemiarthroplasty shoulder fracture Right 1980's  . Multiple tooth extractions  ~ 2011    tumors removed ; "my whole top"  . Cardiac catheterization  05/2011  . Tee without cardioversion N/A 01/14/2014    Procedure: TRANSESOPHAGEAL ECHOCARDIOGRAM (TEE);  Surgeon: Sanda Klein, MD;  Location: [place];  Service: Cardiovascular;  Laterality: N/A;  . Breast surgery Bilateral 2011    patient reports benign results  . Bi-ventricular implantable cardioverter defibrillator  (crt-d)  08/2013    Archie Endo 08/23/2013  . Bi-ventricular implantable cardioverter defibrillator N/A 08/22/2013    Procedure: BI-VENTRICULAR IMPLANTABLE CARDIOVERTER DEFIBRILLATOR  (CRT-D);  Surgeon: Deboraha Sprang, MD;  Location: [facility] C[facility] LAB;  Service: Cardiovascular;  Laterality: N/A;   Family History  Problem Relation Age of Onset  . Heart disease Mother 4    Died of MI at age 74 yo  . Heart disease  Paternal Grandmother     requiring pacemaker.  [name] Heart disease Paternal Grandfather 42    Died of MI at possibly age 28-53yo  . Stroke Paternal Grandfather   . Heart disease Father 74    MI age 37yo requiring stenting  . Kidney disease Mother     requiring dialysis  . Congestive Heart Failure Mother   . Diabetes Father   . Diabetes Brother   . Heart disease Brother 44    MI at age 52 years old  . Breast cancer Paternal Aunt   . Breast cancer Maternal Grandmother   . Glaucoma Father    History  Substance Use Topics  . Smoking status: Never Smoker   . Smokeless tobacco: Never Used  . Alcohol Use: No   OB History    No data available     Review of Systems  Cardiovascular: Positive for near-syncope. Negative for chest pain.  All other systems reviewed and are negative.     Allergies  Clindamycin; Ace inhibitors; Enalapril maleate; and Lisinopril  Home Medications   Prior to Admission medications   Medication Sig Start Date End Date Taking? Authorizing Provider  amLODipine (NORVASC) 5 MG tablet Take 1 tablet (5 mg total) by mouth daily. 12/19/14  Yes Deboraha Sprang, MD  aspirin 325 MG tablet Take 1 tablet (325 mg total) by mouth daily. 01/14/15  Yes Corky Sox, MD  citalopram (CELEXA) 20 MG tablet Take 1 tablet (20 mg total) by mouth daily. 10/24/14 10/22/15 Yes Corky Sox, MD  diclofenac sodium (VOLTAREN) 1 % GEL Apply 2 g topically 4 (four) times daily as needed (pain). 02/13/15  Yes Meredith Staggers, MD  digoxin (LANOXIN) 0.125 MG tablet Take 1 tablet (0.125 mg total) by mouth daily. 02/25/15  Yes Corky Sox, MD  fluticasone (CUTIVATE) 0.05 % cream Apply topically 2 (two) times daily. Dispense two tubes 02/25/15  Yes Corky Sox, MD  furosemide (LASIX) 40 MG tablet Take 1 tablet (40 mg total) by mouth daily. 02/17/15  Yes Larey Dresser, MD  hydrALAZINE (APRESOLINE) 50 MG tablet Take 1.5 tablets (75 mg total) by mouth 3 (three) times daily. 02/17/15  Yes Larey Dresser, MD   Insulin Glargine (LANTUS SOLOSTAR) 100 UNIT/ML Solostar Pen Inject 10 units in the AM, 35 units in the PM 10/01/14  Yes Corky Sox, MD  ipratropium (ATROVENT) 0.06 % nasal spray Place 2 sprays into both nostrils 4 (four) times daily. Patient taking differently: Place 2 sprays into both nostrils daily as needed for rhinitis.  01/07/15  Yes Billy Fischer, MD  isosorbide mononitrate (IMDUR) 60 MG 24 hr tablet Take 1 tablet (60 mg total) by mouth every morning. 08/21/14 08/21/15 Yes Corky Sox, MD  levETIRAcetam (KEPPRA) 500 MG tablet Take 1 tablet (500 mg total) by mouth 2 (two) times daily. 10/16/14  Yes Tasrif Ahmed, MD  metFORMIN (GLUCOPHAGE) 1000 MG tablet Take  1,000 mg by mouth daily.  04/12/14 04/12/15 Yes Corky Sox, MD  metoprolol succinate (TOPROL-XL) 25 MG 24 hr tablet Take 50 mg (2 tablets) twice daily. 02/13/15  Yes Deboraha Sprang, MD  nystatin cream (MYCOSTATIN) APPLY ONE APPLICATION TOPICALLY TWO TIMES DAILY 02/24/15  Yes Corky Sox, MD  pantoprazole (PROTONIX) 40 MG tablet Take 40 mg by mouth daily.   Yes Historical Provider, MD  potassium chloride 20 MEQ TBCR Take 20 mEq by mouth daily. 08/07/14  Yes Rande Brunt, NP  simvastatin (ZOCOR) 40 MG tablet Take 40 mg by mouth at bedtime.    Yes Historical Provider, MD  Vitamin D, Ergocalciferol, (DRISDOL) 50000 UNITS CAPS capsule Take 1 capsule (50,000 Units total) by mouth every 7 (seven) days. 02/19/15  Yes Annia Belt, MD  Blood Glucose Monitoring Suppl (ACCU-CHEK AVIVA PLUS) W/DEVICE KIT 1 each by Does not apply route 3 (three) times daily. 01/14/15   Corky Sox, MD  cyclobenzaprine (FLEXERIL) 5 MG tablet Take 1 tablet (5 mg total) by mouth 3 (three) times daily as needed for muscle spasms. Patient not taking: Reported on 03/05/2015 01/07/15   Billy Fischer, MD  glucose blood (ACCU-CHEK SMARTVIEW) test strip Use to Check Blood Sugar TID. Code: E11.40. 09/30/14   Corky Sox, MD  Incontinence Supplies MISC 1 year supply of Pull-ups in  size XL Diagnosis Code: 788.33 08/27/14   Corky Sox, MD  Insulin Pen Needle (GLOBAL EASE INJECT PEN NEEDLES) 31G X 5 MM MISC Use to inject insulin BID. Code: E 11.40 09/30/14   Corky Sox, MD  oxyCODONE-acetaminophen (PERCOCET) 10-325 MG per tablet Take 1 tablet by mouth every 8 (eight) hours as needed for pain. Patient not taking: Reported on 03/05/2015 02/03/15   Bayard Hugger, NP   BP 122/77 mmHg  Pulse 94  Temp(Src) 97.9 F (36.6 C) (Oral)  Resp 15  Wt 259 lb (117.482 kg)  SpO2 94%  LMP 02/08/2015 Physical Exam  Constitutional: She is oriented to person, place, and time. She appears well-developed and well-nourished. No distress.  HENT:  Head: Normocephalic and atraumatic.  Mouth/Throat: Oropharynx is clear and moist.  Eyes: EOM are normal. Pupils are equal, round, and reactive to light.  Neck: Normal range of motion. Neck supple.  Cardiovascular: Normal rate and regular rhythm.  Exam reveals no gallop and no friction rub.   No murmur heard. Pulmonary/Chest: Effort normal and breath sounds normal. No respiratory distress. She has no wheezes.  Abdominal: Soft. Bowel sounds are normal. She exhibits no distension. There is no tenderness.  Musculoskeletal: Normal range of motion.  Neurological: She is alert and oriented to person, place, and time. No cranial nerve deficit. She exhibits normal muscle tone. Coordination normal.  Skin: Skin is warm and dry. She is not diaphoretic.  Nursing note and vitals reviewed.   ED Course  Procedures (including critical care time) Labs Review Labs Reviewed  CBC - Abnormal; Notable for the following:    WBC 14.8 (*)    Hemoglobin 11.8 (*)    HCT 35.9 (*)    All other components within normal limits  BASIC METABOLIC PANEL - Abnormal; Notable for the following:    Glucose, Bld 232 (*)    GFR calc non Af Amer 78 (*)    GFR calc Af Amer 90 (*)    All other components within normal limits  I-STAT TROPOININ, ED    Imaging Review No  results found.   EKG  Interpretation   Date/Time:  Wednesday March 05 2015 13:14:35 EDT Ventricular Rate:  94 PR Interval:  144 QRS Duration: 134 QT Interval:  424 QTC Calculation: 530 R Axis:   110 Text Interpretation:  Electronic ventricular pacemaker Confirmed by DELOS   MD, Carlyle Mcelrath ([id number]) on 03/05/2015 2:10:29 PM      MDM   Final diagnoses:  None    Patient presents here with complaints of near syncope which resolved after drinking grape juice. She believes her sugar may have dropped. Her vital signs are stable and laboratory studies are all unremarkable. She is feeling better and actually wants to go home. I see no reason for admission and feel as though this is appropriate.     Veryl Speak, MD 03/05/15 [phone]

## 2015-03-05 NOTE — ED Notes (Signed)
Pt reports mid chest pain x 1 hour with nausea. Denies sob. ekg done. Airway intact.

## 2015-03-05 NOTE — Discharge Instructions (Signed)
Near-Syncope Near-syncope (commonly known as near fainting) is sudden weakness, dizziness, or feeling like you might pass out. During an episode of near-syncope, you may also develop pale skin, have tunnel vision, or feel sick to your stomach (nauseous). Near-syncope may occur when getting up after sitting or while standing for a long time. It is caused by a sudden decrease in blood flow to the brain. This decrease can result from various causes or triggers, most of which are not serious. However, because near-syncope can sometimes be a sign of something serious, a medical evaluation is required. The specific cause is often not determined. HOME CARE INSTRUCTIONS  Monitor your condition for any changes. The following actions may help to alleviate any discomfort you are experiencing:  Have someone stay with you until you feel stable.  Lie down right away and prop your feet up if you start feeling like you might faint. Breathe deeply and steadily. Wait until all the symptoms have passed. Most of these episodes last only a few minutes. You may feel tired for several hours.   Drink enough fluids to keep your urine clear or pale yellow.   If you are taking blood pressure or heart medicine, get up slowly when seated or lying down. Take several minutes to sit and then stand. This can reduce dizziness.  Follow up with your health care provider as directed. SEEK IMMEDIATE MEDICAL CARE IF:   You have a severe headache.   You have unusual pain in the chest, abdomen, or back.   You are bleeding from the mouth or rectum, or you have black or tarry stool.   You have an irregular or very fast heartbeat.   You have repeated fainting or have seizure-like jerking during an episode.   You faint when sitting or lying down.   You have confusion.   You have difficulty walking.   You have severe weakness.   You have vision problems.  MAKE SURE YOU:   Understand these instructions.  Will  watch your condition.  Will get help right away if you are not doing well or get worse. Document Released: 11/29/2005 Document Revised: 12/04/2013 Document Reviewed: 05/04/2013 ExitCare Patient Information 2015 [employer]. This information is not intended to replace advice given to you by your health care provider. Make sure you discuss any questions you have with your health care provider.  

## 2015-03-05 NOTE — Progress Notes (Signed)
Clinical Social Work Department BRIEF PSYCHOSOCIAL ASSESSMENT 03/05/2015  Patient:  [name], [name]     Account Number:  [id number]     Admit date:  03/05/2015  Clinical Social Worker:  Forest Gleason  Date/Time:  03/05/2015 02:56 PM  Referred by:  Physician  Date Referred:  03/05/2015 Referred for  Homelessness  Psychosocial assessment   Other Referral:   Interview type:  Patient Other interview type:   Review of chart    PSYCHOSOCIAL DATA Living Status:  ALONE Admitted from facility:   Level of care:  Independent Living Primary support name:  Daughter:  Thayer Headings   (daughter called and is coming this evening to see patient) Primary support relationship to patient:  CHILD, ADULT Degree of support available:   Patient has high level of support with adult children and other family members. However at this time, patient cannot live with anyone due to multiple transitions  Strong emotional support    CURRENT CONCERNS Current Concerns  Behavioral Health Issues  Other - See comment   Other Concerns:   Homeless issues.  Patient has been living in hotel causing increased stress/anxiety triggering chest pain.    SOCIAL WORK ASSESSMENT / PLAN LCSW met with patient, reviewed chart and explained role. Patient cooperative and accepting. Patient reports feeling very tired, reporting high level of anxiety AEB chest pain and ?panic attacks. patient reports stressors include: limited finiancial income, recent evication from mobile home, past legal charges, medical problems including back pain and completing daily tasks.  patient reports she was living in her mobile home for almost three years. Landlord found out she had a record from 10-12 years ago and evicted her. Patient reports it was for drug possession and served 2 years in jail. Reports nothing pending at this time legally and no on probation. Denies any court dates of problems currently.  Reports no current SA. Reports no current  SI/HI/AVH. patient is alert and oriented at this time. Able to report main trigger is paying past bills on mobile home and living in motel for last 2 months.  Patient reports she is just stressed, not sleeping, and has had increased depression/anxiety.  Patient currently taking Celexa.  Reports only follow up is with PCP at [facility] with [facility] in which she is compliant.  Due to patient being potientally homeless, LCSW completed pre-authorization of VI-SPDAT.  LCSW called [facility] and discussed case with Kenney Houseman who accepted and will follow up with patient. Meeting with Tonya on 3/24 to review scores and plan for patient. Patient agreeable with this plan and hopeful for assistance with housing. She currently has PCS services 2 hours every day. She is on the wait list for CAP and is number [phone].  Daughter was called per patient request and will be coming to see patient and if discharged can take her home.  No other needs at this time. She can return to motel if discharged per report. Financial planner to take case and follow up with this Probation officer in the morning.   Assessment/plan status:  No Further Intervention Required Other assessment/ plan:   VI-SPDAT   Information/referral to community resources:   Referral to [facility]:  Partnership to End Homelessness    PATIENT'S/FAMILY'S RESPONSE TO PLAN OF CARE: Patient was very cooperative and engaged. Agreeable for short term plan to return home to motel if medically cleared with follow up from this Probation officer and community Education officer, museum. patient voiced feelings of stress, frustration with her current situation, and  positive outcomes with completing assessment.  Appreciative of consult per patient and time spent to provide assistance and support.     Lane Hacker, MSW Clinical Social Work: Emergency Room [phone]

## 2015-03-05 NOTE — ED Notes (Signed)
On assessment, pt completely incontinent of urine. Pt cleaned with new linens placed. Pt reports increased incontinence since starting diuretic. Pt also requesting daughter be contacted to provide update on care. Spoke to [employer] on phone [phone].

## 2015-03-11 ENCOUNTER — Telehealth: Payer: Self-pay | Admitting: *Deleted

## 2015-03-11 NOTE — Telephone Encounter (Signed)
[name] is not taking anything for spasms currently and having a great deal of problems with her leg and is requesting flexeril. (see previous call note with this message) She will need 30 day to [place] and then an order sent to her [facility] mail order. Can she have this?

## 2015-03-11 NOTE — Telephone Encounter (Signed)
Calling about getting a refill on her flexeril. I spoke with her because Dr Artis Flock was the last to order this but apparently he saw her in ED.  She says that Dr Riley Kill used to order it for her and hse is having a real problem with spasms in her leg.  In looking through her chart it looks like there was a question about baclofen and tizanidine so I have put a call back in to her to verify if she is taking either of those before we reorder any flexeril.

## 2015-03-12 ENCOUNTER — Encounter: Payer: Medicaid Other | Attending: Physical Medicine & Rehabilitation | Admitting: Physical Medicine & Rehabilitation

## 2015-03-12 ENCOUNTER — Encounter: Payer: Self-pay | Admitting: Physical Medicine & Rehabilitation

## 2015-03-12 VITALS — BP 162/93 | HR 86 | Resp 14

## 2015-03-12 DIAGNOSIS — Z8673 Personal history of transient ischemic attack (TIA), and cerebral infarction without residual deficits: Secondary | ICD-10-CM

## 2015-03-12 DIAGNOSIS — G8114 Spastic hemiplegia affecting left nondominant side: Secondary | ICD-10-CM

## 2015-03-12 DIAGNOSIS — E1342 Other specified diabetes mellitus with diabetic polyneuropathy: Secondary | ICD-10-CM | POA: Diagnosis present

## 2015-03-12 DIAGNOSIS — E1142 Type 2 diabetes mellitus with diabetic polyneuropathy: Secondary | ICD-10-CM

## 2015-03-12 DIAGNOSIS — M47817 Spondylosis without myelopathy or radiculopathy, lumbosacral region: Secondary | ICD-10-CM | POA: Diagnosis present

## 2015-03-12 DIAGNOSIS — Z5181 Encounter for therapeutic drug level monitoring: Secondary | ICD-10-CM | POA: Diagnosis present

## 2015-03-12 DIAGNOSIS — Z79899 Other long term (current) drug therapy: Secondary | ICD-10-CM | POA: Insufficient documentation

## 2015-03-12 DIAGNOSIS — G629 Polyneuropathy, unspecified: Secondary | ICD-10-CM | POA: Diagnosis present

## 2015-03-12 DIAGNOSIS — I5042 Chronic combined systolic (congestive) and diastolic (congestive) heart failure: Secondary | ICD-10-CM | POA: Diagnosis present

## 2015-03-12 DIAGNOSIS — G8110 Spastic hemiplegia affecting unspecified side: Secondary | ICD-10-CM | POA: Diagnosis present

## 2015-03-12 DIAGNOSIS — I63319 Cerebral infarction due to thrombosis of unspecified middle cerebral artery: Secondary | ICD-10-CM | POA: Diagnosis present

## 2015-03-12 DIAGNOSIS — M1712 Unilateral primary osteoarthritis, left knee: Secondary | ICD-10-CM

## 2015-03-12 MED ORDER — OXYCODONE-ACETAMINOPHEN 10-325 MG PO TABS: 1.0000 | ORAL_TABLET | Freq: Three times a day (TID) | ORAL | Status: DC | PRN

## 2015-03-12 MED ORDER — CYCLOBENZAPRINE HCL 5 MG PO TABS: 5.0000 mg | ORAL_TABLET | Freq: Three times a day (TID) | ORAL | Status: DC | PRN

## 2015-03-12 NOTE — Progress Notes (Signed)
Botox Injection for spasticity using needle EMG guidance Indication: spastic hemiparesis, left, non-dominant limb, (upper ext)  Dilution: 100 Units/ml        Total Units Injected: 400 Indication: Severe spasticity which interferes with ADL,mobility and/or  hygiene and is unresponsive to medication management and other conservative care Informed consent was obtained after describing risks and benefits of the procedure with the patient. This includes bleeding, bruising, infection, excessive weakness, or medication side effects. A REMS form is on file and signed.  Needle: 68mm injectable monopolar needle electrode  Number of units per muscle Pectoralis Major 0 units Pectoralis Minor 0 units Biceps 75 units Brachioradialis 100 units FCR 25 units FCU 0 units FDS 75 units FDP 75 units FPL 0 units Pronator Teres 50 units All injections were done after obtaining appropriate EMG activity and after negative drawback for blood. The patient tolerated the procedure well. Post procedure instructions were given. A followup appointment was made.   Percocet and flexeril were refilled today

## 2015-03-12 NOTE — Telephone Encounter (Signed)
[name] has an appointment today, so closing encounter

## 2015-03-12 NOTE — Patient Instructions (Signed)
PLEASE CALL ME WITH ANY PROBLEMS OR QUESTIONS (#[phone]).      

## 2015-03-17 ENCOUNTER — Telehealth (HOSPITAL_COMMUNITY): Payer: Self-pay | Admitting: Unknown Physician Specialty

## 2015-03-17 ENCOUNTER — Encounter (HOSPITAL_COMMUNITY): Payer: Medicaid Other

## 2015-03-17 ENCOUNTER — Ambulatory Visit (HOSPITAL_COMMUNITY): Admission: RE | Admit: 2015-03-17 | Payer: Medicaid Other | Source: Ambulatory Visit

## 2015-03-25 ENCOUNTER — Ambulatory Visit: Payer: Medicaid Other | Admitting: *Deleted

## 2015-03-25 ENCOUNTER — Encounter: Payer: Self-pay | Admitting: Internal Medicine

## 2015-03-25 DIAGNOSIS — I255 Ischemic cardiomyopathy: Secondary | ICD-10-CM

## 2015-03-25 LAB — MDC_IDC_ENUM_SESS_TYPE_INCLINIC
Battery Remaining Longevity: 79 mo
Brady Statistic AP VP Percent: 0.01 %
Brady Statistic AS VS Percent: 1.73 %
Date Time Interrogation Session: 20160412152234
HighPow Impedance: 133 Ohm
HighPow Impedance: 57 Ohm
Lead Channel Impedance Value: 418 Ohm
Lead Channel Pacing Threshold Amplitude: 0.5 V
Lead Channel Pacing Threshold Amplitude: 0.5 V
Lead Channel Pacing Threshold Amplitude: 0.75 V
Lead Channel Pacing Threshold Pulse Width: 0.4 ms
Lead Channel Pacing Threshold Pulse Width: 0.4 ms
Lead Channel Sensing Intrinsic Amplitude: 3.375 mV
Lead Channel Setting Pacing Amplitude: 2 V
Lead Channel Setting Pacing Amplitude: 2.5 V
Lead Channel Setting Pacing Pulse Width: 0.4 ms
Lead Channel Setting Sensing Sensitivity: 0.3 mV
MDC IDC MSMT BATTERY VOLTAGE: 2.98 V
MDC IDC MSMT LEADCHNL RV IMPEDANCE VALUE: 475 Ohm
MDC IDC MSMT LEADCHNL RV PACING THRESHOLD PULSEWIDTH: 0.4 ms
MDC IDC MSMT LEADCHNL RV SENSING INTR AMPL: 17.625 mV
MDC IDC MSMT LEADCHNL RV SENSING INTR AMPL: 18.25 mV
MDC IDC SET LEADCHNL LV PACING AMPLITUDE: 2 V
MDC IDC SET LEADCHNL RV PACING PULSEWIDTH: 0.4 ms
MDC IDC SET ZONE DETECTION INTERVAL: 250 ms
MDC IDC SET ZONE DETECTION INTERVAL: 300 ms
MDC IDC STAT BRADY AP VS PERCENT: 0.01 %
MDC IDC STAT BRADY AS VP PERCENT: 98.25 %
MDC IDC STAT BRADY RA PERCENT PACED: 0.03 %
MDC IDC STAT BRADY RV PERCENT PACED: 28.4 %
Zone Setting Detection Interval: 370 ms
Zone Setting Detection Interval: 400 ms

## 2015-03-25 NOTE — Progress Notes (Signed)
CRT-D device check in office. Thresholds and sensing consistent with previous device measurements. Lead impedance trends stable over time. No mode switch episodes recorded. No ventricular arrhythmia episodes recorded. Patient bi-ventricularly pacing 98.1% of the time. Device programmed with appropriate safety margins. Optivol increase 4-3 and ongoing--pt has some swelling in stomach/legs. Minimal SOB. Furosemide 40 mg qd started back x 1 mth ago. Thoracic impedance shows some improvement.  GT reviewed with no changes to be made. No changes made this session. Estimated longevity 6.3 years.  Patient enrolled in remote follow up but lost previous monitor. New monitor and WireX ordered for pt. Pt instructed to hook up monitor once received. [facility] 06-24-15 and ROV in September with SK.

## 2015-03-27 ENCOUNTER — Encounter: Payer: Self-pay | Admitting: Internal Medicine

## 2015-03-31 ENCOUNTER — Other Ambulatory Visit: Payer: Self-pay | Admitting: *Deleted

## 2015-03-31 MED ORDER — LEVETIRACETAM 500 MG PO TABS: 500.0000 mg | ORAL_TABLET | Freq: Two times a day (BID) | ORAL | Status: DC

## 2015-04-02 ENCOUNTER — Ambulatory Visit (HOSPITAL_BASED_OUTPATIENT_CLINIC_OR_DEPARTMENT_OTHER)
Admission: RE | Admit: 2015-04-02 | Discharge: 2015-04-02 | Disposition: A | Discharge status: Home / Self Care | Payer: Medicaid Other | Source: Ambulatory Visit | Attending: Internal Medicine | Admitting: Internal Medicine

## 2015-04-02 ENCOUNTER — Ambulatory Visit (HOSPITAL_COMMUNITY)
Admission: RE | Admit: 2015-04-02 | Discharge: 2015-04-02 | Disposition: A | Discharge status: Home / Self Care | Payer: Medicaid Other | Source: Ambulatory Visit | Attending: Internal Medicine | Admitting: Internal Medicine

## 2015-04-02 VITALS — BP 132/76 | HR 93 | Wt 255.8 lb

## 2015-04-02 DIAGNOSIS — I429 Cardiomyopathy, unspecified: Secondary | ICD-10-CM

## 2015-04-02 DIAGNOSIS — I10 Essential (primary) hypertension: Secondary | ICD-10-CM

## 2015-04-02 DIAGNOSIS — I5032 Chronic diastolic (congestive) heart failure: Secondary | ICD-10-CM

## 2015-04-02 DIAGNOSIS — I5042 Chronic combined systolic (congestive) and diastolic (congestive) heart failure: Secondary | ICD-10-CM | POA: Diagnosis not present

## 2015-04-02 DIAGNOSIS — I428 Other cardiomyopathies: Secondary | ICD-10-CM

## 2015-04-02 DIAGNOSIS — I509 Heart failure, unspecified: Secondary | ICD-10-CM | POA: Diagnosis not present

## 2015-04-02 LAB — BASIC METABOLIC PANEL
Anion gap: 9 (ref 5–15)
BUN: 9 mg/dL (ref 6–23)
CO2: 26 mmol/L (ref 19–32)
Calcium: 9.4 mg/dL (ref 8.4–10.5)
Chloride: 104 mmol/L (ref 96–112)
Creatinine, Ser: 0.69 mg/dL (ref 0.50–1.10)
GFR calc Af Amer: >90 mL/min (ref >90)
GFR calc non Af Amer: >90 mL/min (ref >90)
GLUCOSE: 160 mg/dL — AB (ref 70–99)
Potassium: 4.1 mmol/L (ref 3.5–5.1)
Sodium: 139 mmol/L (ref 135–145)

## 2015-04-02 LAB — DIGOXIN LEVEL

## 2015-04-02 MED ORDER — METOPROLOL SUCCINATE ER 25 MG PO TB24: ORAL_TABLET | ORAL | Status: DC

## 2015-04-02 NOTE — Patient Instructions (Signed)
INCREASE Toprol XL to 75 mg (3 tabs) twice a day  LABS today  Your physician recommends that you schedule a follow-up appointment in: 2 months  Do the following things EVERYDAY: 1) Weigh yourself in the morning before breakfast. Write it down and keep it in a log. 2) Take your medicines as prescribed 3) Eat low salt foods-Limit salt (sodium) to 2000 mg per day.  4) Stay as active as you can everyday 5) Limit all fluids for the day to less than 2 liters 6)

## 2015-04-02 NOTE — Progress Notes (Signed)
Patient ID: [name], female   DOB: 03/18/1966, 49 y.o.   MRN: [id number]  Weight Range  282 pounds  Baseline proBNP   EP: Dr Caryl Comes PCP: [facility]  Angioedema with ACE-I  --Optivol--  HPI: 49 yo with history of morbid obesity, HTN, HLD, CAD, DM2, OSA on CPAP, CVA and combined CHF in the setting of poorly-controlled HTN. She was admitted in 05/2011 with chest pain and new LBBB and ended up getting a left heart cath, which showed no angiographic CAD. Renal artery dopplers were normal in 04/2012.  She has a Medtronic CRT-D device.   Was hospitalized May 2014 with acute on chronic combined systolic and diastolic CHF. EF from December 2013 was down to 40 to 45%.   Admitted in January 2015 with slurred speech, L arm weakness and facial droop. Initial head CT showed no acute abnormality. Repeat head CT showed subtle gray white matter differentiation in R frontal lobe and R operculum perhaps representing ischemic R sided MCA. STAT repeat CT head later on 1/29 showed further evolution of R MCA territory infarct involving R insular cortex and operculum. No hemorrhage. Neurology recommended CTA head/neck, which showed occlusion of MCA branch to the area of R MCA infarct and a normal neck CTA. TEE was done and did not reveal embolic source. Was discharged to inpatient rehab.  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  She was started on Keppra.  She went to the ER in 2/16 with syncope that was probably due to seizure, no arrhythmias with device interrogation.  Stable symptomatically.  Not walking much but denies dyspnea.  She can lie flat without problems.  She has left knee OA and walks with a cane.  She has left-sided weakness residual from her CVA.  No chest pain.  No lightheadedness or palpitations. At last visit, she was volume overloaded and was told to take Lasix every day. Weight is down 2 lbs.  Echo today was reviewed, EF 40-45%.    Optivol: No atrial fibrillation, not very active, thoracic impedance increasing and fluid index < threshold.     ECHO 11/2012: 40-45% ECHO  07/2013: 20% RV normal ECHO    08/23/2013: EF 40-45%, diff HK ECHO   09/05/2013: EF 25-30%, diff HK  ECHO  12/11/13: EF 30%, diff HK, RV normal ECHO 01/14/2014: EF 35-40%, LA mod dilated  ECHO 4/16: EF 40-45%, moderate LVH, normal RV size with mildly decreased systolic function.    Labs 06/2013: proBNP 230, Cr 0.9, K 3.7          08/2013: K+ 3.9, Cr 0.9       09/2013: K 4.2, Cr 0.86, pro-BNP 718, dig level <0.3     10/07/13: K+ 4.1, Cr 0.83       05/08/14: K 4.0 Creatinine 0.67 Pro BNP 233 Dig level 0.3        08/20/2014 K 3.8 nCreatinine 0.56        2/16: K 3.6, creatinine 0.95, LDL 106, HDL 43       3/16: K 3.5, creatinine 0.87, HCT 35.9  ROS: All systems negative except as listed in HPI, PMH and Problem List.  Past Medical History  Diagnosis Date  . Hypertension     Poorly controlled. Has had HTN since age 71. Angioedema with ACEI.  24 Hr urine and renal arterial dopplers ordered . . . Never done  . NICM (nonischemic cardiomyopathy)  EF 45-50% in 8/12, cath 6/12 showed normal coronaries, EF 50-55% by LV gram  . Morbid obesity   . Allergic rhinitis   . HLD (hyperlipidemia)   . OSA on CPAP     sleep study in 8/12 showed moderate to severe OSA requiring CPAP  . Chronic lower back pain     secondary to DJD, obsetiy, hip problems. Followed by Dr. Oval Linsey (pain management)  . Chronic diastolic heart failure      Primarily diastolic CHF: Likely due to uncontrolled HTN. Last echo (8/12) with EF 45-50%, mild to moderate LVH with some asymmetric septal hypertrophy, RV normal size and systolic function. EF 50-55% by LV-gram in 6/12.   . Polyneuropathy in diabetes(357.2)   . Thoracic or lumbosacral neuritis or radiculitis, unspecified   . Calcifying tendinitis of shoulder   . LBBB (left bundle branch block)   . Chronic combined systolic and  diastolic CHF (congestive heart failure)     EF 40-45% by echo 12/06/2012  . Coronary artery disease     questionable. LHC 05/2011 showing normal coronaries // Followed at [facility], Dr. Aundra Dubin  . GERD (gastroesophageal reflux disease)   . Shortness of breath   . Asthma   . Automatic implantable cardioverter-defibrillator in situ   . Stroke 12/2013    "my left side is paralyzed" (07/04/2014)  . Type II diabetes mellitus DX: 2002  . 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 Degeneration of lumbar or lumbosacral intervertebral disc   . Arthritis     "hips, back, legs, arms" (07/04/2014)  . Frequent UTI   . Liver disease     Current Outpatient Prescriptions  Medication Sig Dispense Refill  . amLODipine (NORVASC) 5 MG tablet Take 1 tablet (5 mg total) by mouth daily. 30 tablet 6  . aspirin 325 MG tablet Take 1 tablet (325 mg total) by mouth daily. 30 tablet 5  . Blood Glucose Monitoring Suppl (ACCU-CHEK AVIVA PLUS) W/DEVICE KIT 1 each by Does not apply route 3 (three) times daily. 1 kit 0  . citalopram (CELEXA) 20 MG tablet Take 1 tablet (20 mg total) by mouth daily. 30 tablet 5  . cyclobenzaprine (FLEXERIL) 5 MG tablet Take 1 tablet (5 mg total) by mouth 3 (three) times daily as needed for muscle spasms. 30 tablet 3  . diclofenac sodium (VOLTAREN) 1 % GEL Apply 2 g topically 4 (four) times daily as needed (pain). 3 Tube 5  . digoxin (LANOXIN) 0.125 MG tablet Take 1 tablet (0.125 mg total) by mouth daily. 30 tablet 5  . fluticasone (CUTIVATE) 0.05 % cream Apply topically 2 (two) times daily. Dispense two tubes 30 g 6  . furosemide (LASIX) 40 MG tablet Take 1 tablet (40 mg total) by mouth daily. 90 tablet 3  . glucose blood (ACCU-CHEK SMARTVIEW) test strip Use to Check Blood Sugar TID. Code: E11.40. 100 each 12  . hydrALAZINE (APRESOLINE) 50 MG tablet Take 1.5 tablets (75 mg total) by mouth 3 (three) times daily. 270 tablet 3  . Incontinence Supplies MISC 1 year  supply of Pull-ups in size XL Diagnosis Code: 788.33 1 each 0  . Insulin Glargine (LANTUS SOLOSTAR) 100 UNIT/ML Solostar Pen Inject 10 units in the AM, 35 units in the PM 15 mL 11  . Insulin Pen Needle (GLOBAL EASE INJECT PEN NEEDLES) 31G X 5 MM MISC Use to inject insulin BID. Code: E 11.40 100 each 10  . ipratropium (ATROVENT) 0.06 %  nasal spray Place 2 sprays into both nostrils 4 (four) times daily. (Patient taking differently: Place 2 sprays into both nostrils daily as needed for rhinitis. ) 15 mL 1  . isosorbide mononitrate (IMDUR) 60 MG 24 hr tablet Take 1 tablet (60 mg total) by mouth every morning. 30 tablet 11  . levETIRAcetam (KEPPRA) 500 MG tablet Take 1 tablet (500 mg total) by mouth 2 (two) times daily. 60 tablet 3  . metFORMIN (GLUCOPHAGE) 1000 MG tablet Take 1,000 mg by mouth daily.     . metoprolol succinate (TOPROL-XL) 25 MG 24 hr tablet Take $RemoveBef'75mg'BJushggQYF$  (3 tablets) twice daily. 180 tablet 1  . nystatin cream (MYCOSTATIN) APPLY ONE APPLICATION TOPICALLY TWO TIMES DAILY 30 g 0  . oxyCODONE-acetaminophen (PERCOCET) 10-325 MG per tablet Take 1 tablet by mouth every 8 (eight) hours as needed for pain. 90 tablet 0  . pantoprazole (PROTONIX) 40 MG tablet Take 40 mg by mouth daily.    . potassium chloride 20 MEQ TBCR Take 20 mEq by mouth daily. 90 tablet 3  . simvastatin (ZOCOR) 40 MG tablet Take 40 mg by mouth at bedtime.     . Vitamin D, Ergocalciferol, (DRISDOL) 50000 UNITS CAPS capsule Take 1 capsule (50,000 Units total) by mouth every 7 (seven) days. 24 capsule 0   No current facility-administered medications for this encounter.   Filed Vitals:   04/02/15 1206  BP: 132/76  Pulse: 93  Weight: 255 lb 12 oz (116.007 kg)  SpO2: 99%   PHYSICAL EXAM: General:  NAD. No resp difficulty; son and grand-daughters present.  HEENT: normal Neck: supple. Thick. JVP 7 cm. Carotids 2+ bilaterally; no bruits. No lymphadenopathy appreciated.  Cor: PMI nonpalpable; Regular rate & rhythm. No rubs,  gallops or murmurs. No S3/S4.  Lungs: clear Abdomen: obese soft, nontender, mildly distended. No hepatosplenomegaly. No bruits or masses. Good bowel sounds. Extremities: no cyanosis, clubbing, rash, trace ankle edema.  Neuro: alert & orientedx3, cranial nerves grossly intact. Moves all 4 extremities w/o difficulty. Affect pleasant.  ASSESSMENT: 1. Chronic systolic HF: Nonischemic cardiomyopathy, normal coronaries on 6/12 angiography.CMP may be due to HTN.  Echo today showed EF up a bit to 40-45%.  S/p CRT-D Medtronic.  NYHA class II symptoms, volume looks ok by exam and by Optivol. - Continue Lasix 40 mg daily. BMET today.  - Increase Toprol XL to 75 mg bid.  - Continue hydralazine 75 mg tid, continue Imdur 60 daily.  - Continue digoxin, check level.  - No  ACE-I due to angioedema.  Will consider ARB in future, risk of cross-reaction for angioedema is not high.  - Reinforced the need and importance of daily weights, a low sodium diet, and fluid restriction (less than 2 L a day). Instructed to call the HF clinic if weight increases more than 3 lbs overnight or 5 lbs in a week.  2. HTN: BP ok today, increasing Toprol XL as above.  3. Morbid obesity:  Encouraged weight loss with portion control. Asked patient to try and be more active.  4. OSA on CPAP: Uses intermittently. Encouraged to use nightly.  5. CVA: No atrial fibrillation has been noted on device monitoring.  Continue ASA, no indication at this point for anticoagulation.   Loralie Champagne 04/02/2015

## 2015-04-02 NOTE — Progress Notes (Signed)
  Echocardiogram 2D Echocardiogram has been performed.  [name] M 04/02/2015, 12:07 PM

## 2015-04-09 ENCOUNTER — Ambulatory Visit: Payer: Medicaid Other | Admitting: Neurology

## 2015-04-09 ENCOUNTER — Other Ambulatory Visit: Payer: Self-pay | Admitting: Registered Nurse

## 2015-04-09 ENCOUNTER — Encounter: Payer: Self-pay | Admitting: Registered Nurse

## 2015-04-09 ENCOUNTER — Encounter: Payer: Medicaid Other | Attending: Physical Medicine & Rehabilitation | Admitting: Registered Nurse

## 2015-04-09 VITALS — BP 160/89 | HR 95 | Resp 16

## 2015-04-09 DIAGNOSIS — E1142 Type 2 diabetes mellitus with diabetic polyneuropathy: Secondary | ICD-10-CM

## 2015-04-09 DIAGNOSIS — Z5181 Encounter for therapeutic drug level monitoring: Secondary | ICD-10-CM | POA: Diagnosis not present

## 2015-04-09 DIAGNOSIS — I63319 Cerebral infarction due to thrombosis of unspecified middle cerebral artery: Secondary | ICD-10-CM | POA: Diagnosis present

## 2015-04-09 DIAGNOSIS — G8114 Spastic hemiplegia affecting left nondominant side: Secondary | ICD-10-CM

## 2015-04-09 DIAGNOSIS — G8110 Spastic hemiplegia affecting unspecified side: Secondary | ICD-10-CM

## 2015-04-09 DIAGNOSIS — M1712 Unilateral primary osteoarthritis, left knee: Secondary | ICD-10-CM

## 2015-04-09 DIAGNOSIS — E1342 Other specified diabetes mellitus with diabetic polyneuropathy: Secondary | ICD-10-CM

## 2015-04-09 DIAGNOSIS — I5042 Chronic combined systolic (congestive) and diastolic (congestive) heart failure: Secondary | ICD-10-CM

## 2015-04-09 DIAGNOSIS — M47817 Spondylosis without myelopathy or radiculopathy, lumbosacral region: Secondary | ICD-10-CM | POA: Diagnosis not present

## 2015-04-09 DIAGNOSIS — G629 Polyneuropathy, unspecified: Secondary | ICD-10-CM | POA: Diagnosis present

## 2015-04-09 DIAGNOSIS — Z79899 Other long term (current) drug therapy: Secondary | ICD-10-CM | POA: Diagnosis not present

## 2015-04-09 MED ORDER — OXYCODONE-ACETAMINOPHEN 10-325 MG PO TABS: 1.0000 | ORAL_TABLET | Freq: Three times a day (TID) | ORAL | Status: DC | PRN

## 2015-04-09 NOTE — Progress Notes (Signed)
Subjective:    Patient ID: [name], female    DOB: [birth year], 49 y.o.   MRN: [id number]  HPI: [name] is a 49 year old female who returns for follow up for chronic pain and medication refill. She says her pain is located in her left shoulder and left knee.She rates her pain 5. Her current exercise regime is performing stretching exercises daily and walking with her cane. She's using her 4 prong cane for support. She arrived to office hypertensive, blood pressure re-checked 160/89 she admits she hasn't had her anti=hypertensives this morning. Educated on medication compliance she verbalizes understanding. She states " she will take her medications as soon as she go home.  Pain Inventory Average Pain 5 Pain Right Now 5 My pain is intermittent and aching  In the last 24 hours, has pain interfered with the following? General activity 5 Relation with others 5 Enjoyment of life 5 What TIME of day is your pain at its worst? night Sleep (in general) Poor  Pain is worse with: walking and some activites Pain improves with: rest and medication Relief from Meds: 4  Mobility use a cane ability to climb steps?  yes do you drive?  yes  Function disabled: date disabled .. I need assistance with the following:  dressing, bathing, meal prep, household duties and shopping  Neuro/Psych bladder control problems weakness numbness tingling trouble walking spasms dizziness  Prior Studies Any changes since last visit?  no  Physicians involved in your care Any changes since last visit?  no   Family History  Problem Relation Age of Onset  . Heart disease Mother 82    Died of MI at age 49 yo  . Heart disease Paternal Grandmother     requiring pacemaker.  [name] Heart disease Paternal Grandfather 79    Died of MI at possibly age 60-53yo  . Stroke Paternal Grandfather   . Heart disease Father 74    MI age 69yo requiring stenting  . Kidney disease Mother    requiring dialysis  . Congestive Heart Failure Mother   . Diabetes Father   . Diabetes Brother   . Heart disease Brother 71    MI at age 28 years old  . Breast cancer Paternal Aunt   . Breast cancer Maternal Grandmother   . Glaucoma Father    History   Social History  . Marital Status: Single    Spouse Name: N/A  . Number of Children: 2  . Years of Education: 9th grade   Occupational History  . Unemployed     planning on getting disability   Social History Main Topics  . Smoking status: Never Smoker   . Smokeless tobacco: Never Used  . Alcohol Use: No  . Drug Use: No  . Sexual Activity:    Partners: Male    Birth Control/ Protection: Surgical   Other Topics Concern  . None   Social History Narrative   Lives in [place] with her son. Is able to read and write fluently in [place].   Past Surgical History  Procedure Laterality Date  . Tubal ligation  05/31/1985  . Carpal tunnel release Left   . Hemiarthroplasty shoulder fracture Right 1980's  . Multiple tooth extractions  ~ 2011    tumors removed ; "my whole top"  . Cardiac catheterization  05/2011  . Tee without cardioversion N/A 01/14/2014    Procedure: TRANSESOPHAGEAL ECHOCARDIOGRAM (TEE);  Surgeon: Thurmon Fair, MD;  Location: [facility] ENDOSCOPY;  Service: Cardiovascular;  Laterality: N/A;  . Breast surgery Bilateral 2011    patient reports benign results  . Bi-ventricular implantable cardioverter defibrillator  (crt-d)  08/2013    [name] 08/23/2013  . Bi-ventricular implantable cardioverter defibrillator N/A 08/22/2013    Procedure: BI-VENTRICULAR IMPLANTABLE CARDIOVERTER DEFIBRILLATOR  (CRT-D);  Surgeon: [name], MD;  Location: [facility] CATH LAB;  Service: Cardiovascular;  Laterality: N/A;   Past Medical History  Diagnosis Date  . Hypertension     Poorly controlled. Has had HTN since age 89. Angioedema with ACEI.  24 Hr urine and renal arterial dopplers ordered . . . Never done  . NICM (nonischemic  cardiomyopathy)     EF 45-50% in 8/12, cath 6/12 showed normal coronaries, EF 50-55% by LV gram  . Morbid obesity   . Allergic rhinitis   . HLD (hyperlipidemia)   . OSA on CPAP     sleep study in 8/12 showed moderate to severe OSA requiring CPAP  . Chronic lower back pain     secondary to DJD, obsetiy, hip problems. Followed by Dr. Ivory Broad (pain management)  . Chronic diastolic heart failure      Primarily diastolic CHF: Likely due to uncontrolled HTN. Last echo (8/12) with EF 45-50%, mild to moderate LVH with some asymmetric septal hypertrophy, RV normal size and systolic function. EF 50-55% by LV-gram in 6/12.   . Polyneuropathy in diabetes(357.2)   . Thoracic or lumbosacral neuritis or radiculitis, unspecified   . Calcifying tendinitis of shoulder   . LBBB (left bundle branch block)   . Chronic combined systolic and diastolic CHF (congestive heart failure)     EF 40-45% by echo 12/06/2012  . Coronary artery disease     questionable. LHC 05/2011 showing normal coronaries // Followed at [facility], Dr. Shirlee Latch  . GERD (gastroesophageal reflux disease)   . Shortness of breath   . Asthma   . Automatic implantable cardioverter-defibrillator in situ   . Stroke 12/2013    "my left side is paralyzed" (07/04/2014)  . Type II diabetes mellitus DX: 2002  . 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 Degeneration of lumbar or lumbosacral intervertebral disc   . Arthritis     "hips, back, legs, arms" (07/04/2014)  . Frequent UTI   . Liver disease    BP 160/89 mmHg  Pulse 95  Resp 16  SpO2 98%  Opioid Risk Score:   Fall Risk Score: Moderate Fall Risk (6-13 points) (previously educated and given handout)`1  Depression screen PHQ 2/9  Depression screen [facility] 2/9 03/12/2015 02/17/2015 01/14/2015 11/05/2014 10/24/2014 10/01/2014 08/27/2014  Decreased Interest 0 0 1 0 3 0 0  Down, Depressed, Hopeless 1 0 1 0 3 0 0  PHQ - 2 Score 1 0 2 0 6 0 0  Altered sleeping 1 - 1 - 2  - -  Tired, decreased energy 1 - 1 - 3 - -  Change in appetite 1 - 3 - 2 - -  Feeling bad or failure about yourself  1 - 0 - 0 - -  Trouble concentrating 1 - 3 - 0 - -  Moving slowly or fidgety/restless 1 - 0 - 2 - -  Suicidal thoughts 0 - 0 - 0 - -  PHQ-9 Score 7 - 10 - 15 - -    Review of Systems  Genitourinary:       Bladder control problems  Musculoskeletal: Positive for gait problem.  Spasms  Neurological: Positive for dizziness and weakness.       Tingling  All other systems reviewed and are negative.      Objective:   Physical Exam  Constitutional: She is oriented to person, place, and time. She appears well-developed and well-nourished.  HENT:  Head: Normocephalic and atraumatic.  Neck: Normal range of motion. Neck supple.  Cardiovascular: Normal rate and regular rhythm.   Pulmonary/Chest: Effort normal and breath sounds normal.  Musculoskeletal:  Normal Muscle Bulk and Muscle Testing Reveals: Right: Full ROM and Muscle Strength 5/5 Left: Hemiparesis Lower Extremities: Right: Full ROM and Muscle Strength 5/5 Left: Decreased ROM 15 Degrees and Left Brace Intact  Hemiparesis Arises from chair with ease Walking with 4 prong cane/ Steppage Gait   Neurological: She is alert and oriented to person, place, and time.  Skin: Skin is warm and dry.  Psychiatric: She has a normal mood and affect.  Nursing note and vitals reviewed.         Assessment & Plan:  1. Chronic low back pain: Continue Current Medication Regime. Refilled: oxyCODONE 10/325mg  one tablet every 8 hours as needed.#90.  2. Diabetic peripheral neuropathy: Continue to monitor  3. Recent Right MCA infarct: Left leg with spasms. Continue Baclofen..Botox injection with Dr. Riley Kill in June. 4. OA: Continue Voltaren gel   20 minutes of face to face patient care time was spent during this visit. All questions were encouraged and answered.  F/U in 1 month

## 2015-04-10 LAB — PMP ALCOHOL METABOLITE (ETG): Ethyl Glucuronide (EtG): NEGATIVE ng/mL

## 2015-04-11 LAB — OXYCODONE, URINE (LC/MS-MS)
NOROXYCODONE, UR: 264 ng/mL (ref <50)
OXYCODONE, UR: 95 ng/mL (ref <50)
Oxymorphone: 153 ng/mL (ref <50)

## 2015-04-12 LAB — PRESCRIPTION MONITORING PROFILE (SOLSTAS)
AMPHETAMINE/METH: NEGATIVE ng/mL
BENZODIAZEPINE SCREEN, URINE: NEGATIVE ng/mL
BUPRENORPHINE, URINE: NEGATIVE ng/mL
Barbiturate Screen, Urine: NEGATIVE ng/mL
CANNABINOID SCRN UR: NEGATIVE ng/mL
Carisoprodol, Urine: NEGATIVE ng/mL
Cocaine Metabolites: NEGATIVE ng/mL
Creatinine, Urine: 188.42 mg/dL (ref >20.0)
FENTANYL URINE: NEGATIVE ng/mL
MDMA URINE: NEGATIVE ng/mL
METHADONE SCREEN, URINE: NEGATIVE ng/mL
Meperidine, Ur: NEGATIVE ng/mL
Nitrites, Initial: NEGATIVE ug/mL
Opiate Screen, Urine: NEGATIVE ng/mL
Propoxyphene: NEGATIVE ng/mL
TAPENTADOLUR: NEGATIVE ng/mL
Tramadol Scrn, Ur: NEGATIVE ng/mL
Zolpidem, Urine: NEGATIVE ng/mL
pH, Initial: 6.7 pH (ref 4.5–8.9)

## 2015-04-14 ENCOUNTER — Other Ambulatory Visit: Payer: Self-pay | Admitting: *Deleted

## 2015-04-14 DIAGNOSIS — F329 Major depressive disorder, single episode, unspecified: Secondary | ICD-10-CM

## 2015-04-14 DIAGNOSIS — F32A Depression, unspecified: Secondary | ICD-10-CM

## 2015-04-14 MED ORDER — CITALOPRAM HYDROBROMIDE 20 MG PO TABS: 20.0000 mg | ORAL_TABLET | Freq: Every day | ORAL | Status: DC

## 2015-04-14 NOTE — Telephone Encounter (Signed)
Request is also for metformin.  Not on med list.

## 2015-04-15 ENCOUNTER — Other Ambulatory Visit: Payer: Self-pay | Admitting: Internal Medicine

## 2015-04-15 ENCOUNTER — Encounter: Payer: Self-pay | Admitting: Internal Medicine

## 2015-04-15 ENCOUNTER — Encounter: Payer: Medicaid Other | Admitting: Internal Medicine

## 2015-04-15 DIAGNOSIS — E1165 Type 2 diabetes mellitus with hyperglycemia: Principal | ICD-10-CM

## 2015-04-15 DIAGNOSIS — IMO0002 Reserved for concepts with insufficient information to code with codable children: Secondary | ICD-10-CM

## 2015-04-15 DIAGNOSIS — E1140 Type 2 diabetes mellitus with diabetic neuropathy, unspecified: Secondary | ICD-10-CM

## 2015-04-15 MED ORDER — METFORMIN HCL 1000 MG PO TABS: 1000.0000 mg | ORAL_TABLET | Freq: Every day | ORAL | Status: DC

## 2015-04-22 ENCOUNTER — Ambulatory Visit (INDEPENDENT_AMBULATORY_CARE_PROVIDER_SITE_OTHER): Payer: Medicaid Other | Admitting: Internal Medicine

## 2015-04-22 ENCOUNTER — Encounter: Payer: Self-pay | Admitting: Internal Medicine

## 2015-04-22 VITALS — BP 123/80 | HR 117 | Temp 98.1°F | Ht 69.0 in | Wt 263.1 lb

## 2015-04-22 DIAGNOSIS — B372 Candidiasis of skin and nail: Secondary | ICD-10-CM | POA: Diagnosis not present

## 2015-04-22 DIAGNOSIS — R21 Rash and other nonspecific skin eruption: Secondary | ICD-10-CM | POA: Diagnosis not present

## 2015-04-22 DIAGNOSIS — R350 Frequency of micturition: Secondary | ICD-10-CM | POA: Diagnosis present

## 2015-04-22 DIAGNOSIS — E1140 Type 2 diabetes mellitus with diabetic neuropathy, unspecified: Secondary | ICD-10-CM | POA: Diagnosis not present

## 2015-04-22 DIAGNOSIS — F32A Depression, unspecified: Secondary | ICD-10-CM

## 2015-04-22 DIAGNOSIS — I10 Essential (primary) hypertension: Secondary | ICD-10-CM | POA: Diagnosis not present

## 2015-04-22 DIAGNOSIS — F329 Major depressive disorder, single episode, unspecified: Secondary | ICD-10-CM

## 2015-04-22 DIAGNOSIS — E1165 Type 2 diabetes mellitus with hyperglycemia: Secondary | ICD-10-CM | POA: Diagnosis not present

## 2015-04-22 DIAGNOSIS — J069 Acute upper respiratory infection, unspecified: Secondary | ICD-10-CM | POA: Diagnosis not present

## 2015-04-22 DIAGNOSIS — Z794 Long term (current) use of insulin: Secondary | ICD-10-CM

## 2015-04-22 DIAGNOSIS — Z79899 Other long term (current) drug therapy: Secondary | ICD-10-CM

## 2015-04-22 DIAGNOSIS — G8110 Spastic hemiplegia affecting unspecified side: Secondary | ICD-10-CM | POA: Diagnosis not present

## 2015-04-22 DIAGNOSIS — IMO0002 Reserved for concepts with insufficient information to code with codable children: Secondary | ICD-10-CM

## 2015-04-22 LAB — GLUCOSE, CAPILLARY: GLUCOSE-CAPILLARY: 113 mg/dL — AB (ref 70–99)

## 2015-04-22 LAB — POCT GLYCOSYLATED HEMOGLOBIN (HGB A1C): Hemoglobin A1C: 7.2

## 2015-04-22 MED ORDER — CYCLOBENZAPRINE HCL 5 MG PO TABS: 5.0000 mg | ORAL_TABLET | Freq: Three times a day (TID) | ORAL | Status: DC | PRN

## 2015-04-22 NOTE — Assessment & Plan Note (Signed)
States he is not having depression symptoms and has not been taking her Celexa in over a month, according to the patient. States she was taking it prn for sadness for a while. Asked full depression screening, does not meet criteria for depression at this time, most likely related to improved control of her chronic medical issues.  -Discontinue Celexa (patient stopped taking some time ago) -Discussed at length with patient, if depression symptoms return, can restart SSRI.

## 2015-04-22 NOTE — Progress Notes (Signed)
Subjective:   Patient ID: [name] female   DOB: 25-Sep-1966 49 y.o.   MRN: [id number]  HPI: [name] is a 49 y.o. female w/ PMHx of HTN, HLD, uncontrolled DM type II, OSA, CAD, CHF, and h/o CVA w/ left-sided residual weakness, presents to the clinic today for follow up regarding her multiple medical issues including her DM type II. Seems to be doing  Quite well today, only complaining of muscle spasms in her left leg. Says she has been ambulating well recently, still w/ trouble w/ her LUE. Claims her CBG's have been well controlled at home, states she has been trying to eat healthy. Patient is tachycardic today, had a Redbull earlier because she was tired. Discussed the relative danger of this given her cardiac history.   Past Medical History  Diagnosis Date  . Hypertension     Poorly controlled. Has had HTN since age 67. Angioedema with ACEI.  24 Hr urine and renal arterial dopplers ordered . . . Never done  . NICM (nonischemic cardiomyopathy)     EF 45-50% in 8/12, cath 6/12 showed normal coronaries, EF 50-55% by LV gram  . Morbid obesity   . Allergic rhinitis   . HLD (hyperlipidemia)   . OSA on CPAP     sleep study in 8/12 showed moderate to severe OSA requiring CPAP  . Chronic lower back pain     secondary to DJD, obsetiy, hip problems. Followed by Dr. Oval Linsey (pain management)  . Chronic diastolic heart failure      Primarily diastolic CHF: Likely due to uncontrolled HTN. Last echo (8/12) with EF 45-50%, mild to moderate LVH with some asymmetric septal hypertrophy, RV normal size and systolic function. EF 50-55% by LV-gram in 6/12.   . Polyneuropathy in diabetes(357.2)   . Thoracic or lumbosacral neuritis or radiculitis, unspecified   . Calcifying tendinitis of shoulder   . LBBB (left bundle branch block)   . Chronic combined systolic and diastolic CHF (congestive heart failure)     EF 40-45% by echo 12/06/2012  . Coronary artery disease     questionable.  LHC 05/2011 showing normal coronaries // Followed at [facility], Dr. Aundra Dubin  . GERD (gastroesophageal reflux disease)   . Shortness of breath   . Asthma   . Automatic implantable cardioverter-defibrillator in situ   . Stroke 12/2013    "my left side is paralyzed" (07/04/2014)  . Type II diabetes mellitus DX: 2002  . 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 Degeneration of lumbar or lumbosacral intervertebral disc   . Arthritis     "hips, back, legs, arms" (07/04/2014)  . Frequent UTI   . Liver disease    Current Outpatient Prescriptions  Medication Sig Dispense Refill  . amLODipine (NORVASC) 5 MG tablet Take 1 tablet (5 mg total) by mouth daily. 30 tablet 6  . aspirin 325 MG tablet Take 1 tablet (325 mg total) by mouth daily. 30 tablet 5  . Blood Glucose Monitoring Suppl (ACCU-CHEK AVIVA PLUS) W/DEVICE KIT 1 each by Does not apply route 3 (three) times daily. 1 kit 0  . citalopram (CELEXA) 20 MG tablet Take 1 tablet (20 mg total) by mouth daily. 30 tablet 5  . cyclobenzaprine (FLEXERIL) 5 MG tablet Take 1 tablet (5 mg total) by mouth 3 (three) times daily as needed for muscle spasms. 30 tablet 3  . diclofenac sodium (VOLTAREN) 1 % GEL Apply 2 g  topically 4 (four) times daily as needed (pain). 3 Tube 5  . digoxin (LANOXIN) 0.125 MG tablet Take 1 tablet (0.125 mg total) by mouth daily. 30 tablet 5  . fluticasone (CUTIVATE) 0.05 % cream Apply topically 2 (two) times daily. Dispense two tubes 30 g 6  . furosemide (LASIX) 40 MG tablet Take 1 tablet (40 mg total) by mouth daily. 90 tablet 3  . glucose blood (ACCU-CHEK SMARTVIEW) test strip Use to Check Blood Sugar TID. Code: E11.40. 100 each 12  . hydrALAZINE (APRESOLINE) 50 MG tablet Take 1.5 tablets (75 mg total) by mouth 3 (three) times daily. 270 tablet 3  . Incontinence Supplies MISC 1 year supply of Pull-ups in size XL Diagnosis Code: 788.33 1 each 0  . Insulin Glargine (LANTUS SOLOSTAR) 100 UNIT/ML Solostar Pen  Inject 10 units in the AM, 35 units in the PM 15 mL 11  . Insulin Pen Needle (GLOBAL EASE INJECT PEN NEEDLES) 31G X 5 MM MISC Use to inject insulin BID. Code: E 11.40 100 each 10  . ipratropium (ATROVENT) 0.06 % nasal spray Place 2 sprays into both nostrils 4 (four) times daily. (Patient taking differently: Place 2 sprays into both nostrils daily as needed for rhinitis. ) 15 mL 1  . isosorbide mononitrate (IMDUR) 60 MG 24 hr tablet Take 1 tablet (60 mg total) by mouth every morning. 30 tablet 11  . levETIRAcetam (KEPPRA) 500 MG tablet Take 1 tablet (500 mg total) by mouth 2 (two) times daily. 60 tablet 3  . metFORMIN (GLUCOPHAGE) 1000 MG tablet Take 1 tablet (1,000 mg total) by mouth daily with breakfast. 90 tablet 3  . metoprolol succinate (TOPROL-XL) 25 MG 24 hr tablet Take $RemoveBef'75mg'EUwaVqZiEO$  (3 tablets) twice daily. 180 tablet 1  . nystatin cream (MYCOSTATIN) APPLY ONE APPLICATION TOPICALLY TWO TIMES DAILY 30 g 0  . oxyCODONE-acetaminophen (PERCOCET) 10-325 MG per tablet Take 1 tablet by mouth every 8 (eight) hours as needed for pain. 90 tablet 0  . pantoprazole (PROTONIX) 40 MG tablet Take 40 mg by mouth daily.    . potassium chloride 20 MEQ TBCR Take 20 mEq by mouth daily. 90 tablet 3  . simvastatin (ZOCOR) 40 MG tablet Take 40 mg by mouth at bedtime.     . Vitamin D, Ergocalciferol, (DRISDOL) 50000 UNITS CAPS capsule Take 1 capsule (50,000 Units total) by mouth every 7 (seven) days. 24 capsule 0   No current facility-administered medications for this visit.   Review of Systems  General: Denies fever, diaphoresis, appetite change, and fatigue.  Respiratory: Denies SOB, cough, and wheezing.   Cardiovascular: Denies chest pain and palpitations.  Gastrointestinal: Denies nausea, vomiting, abdominal pain, and diarrhea Musculoskeletal: Positive for LLE pain and gait problem. Denies back pain.  Neurological: Positive for left-sided weakness. Denies dizziness, syncope, lightheadedness, and headaches.    Psychiatric/Behavioral: Denies mood changes, sleep disturbance, and agitation.   Objective:   Physical Exam: Filed Vitals:   04/22/15 1419  BP: 123/80  Pulse: 117  Temp: 98.1 F (36.7 C)  TempSrc: Oral  Height: $Remove'5\' 9"'YVnPfOl$  (1.753 m)  Weight: 263 lb 1.6 oz (119.341 kg)  SpO2: 100%   General: Alert, cooperative, NAD. HEENT: PERRL, EOMI. Moist mucus membranes. Left-sided facial droop.  Neck: Full range of motion without pain, supple, no lymphadenopathy or carotid bruits Lungs: Clear to ascultation bilaterally, normal work of respiration, no wheezes, rales, rhonchi Heart: RRR, no murmurs, gallops, or rubs Abdomen: Soft, non-tender, non-distended, BS + Extremities: No cyanosis, clubbing, or edema. Left  upper and lower extremity weakness.  Neurologic: Alert & oriented X3, cranial nerves II-XII intact, 1/5 strength in LUE, 4/5 in LLE. Otherwise strength intact.   Assessment & Plan:   Please see problem based assessment and plan.

## 2015-04-22 NOTE — Assessment & Plan Note (Signed)
Lab Results  Component Value Date   HGBA1C 7.2 04/22/2015   HGBA1C 7.4 01/14/2015   HGBA1C 7.4 10/01/2014     Assessment: Diabetes control:  Improved Comments: Patient compliant w/ medications, eating healthy, attempting to lose weight. I am very proud of [name].   Plan: Medications:  continue current medications; Lantus 10 units in the AM, 35 units in the PM. Metformin 1000 mg daily Instruction/counseling given: discussed diet Educational resources provided: brochure (denies) Self management tools provided:   Other plans: RTC in 3 months

## 2015-04-22 NOTE — Assessment & Plan Note (Signed)
BP Readings from Last 3 Encounters:  04/22/15 123/80  04/09/15 160/89  04/02/15 132/76    Lab Results  Component Value Date   NA 139 04/02/2015   K 4.1 04/02/2015   CREATININE 0.69 04/02/2015    Assessment: Comments: Well controlled today. Patient states it has been much better recently.   Plan: Medications:  continue current medications; Toprol-XL 75 mg bid, Imdur 60 mg qd, Hydralazine 75 mg tid, Norvasc 5 mg daily, Lasix 40 mg daily Educational resources provided: brochure (denies) Self management tools provided:   Other plans: RTC in 3 months

## 2015-04-22 NOTE — Patient Instructions (Signed)
1. Schedule follow up appointment for 3 months.   2. Please take all medications as previously prescribed with the following changes:  Stop taking Celexa. IF YOU HAVE SYMPTOMS OF DEPRESSION, CALL THE CLINIC AND MAKE AN APPOINTMENT SOONER.  3. If you have worsening of your symptoms or new symptoms arise, please call the clinic ([phone]), or go to the ER immediately if symptoms are severe.  You have done a great job in taking all your medications. Please continue to do this.

## 2015-04-23 ENCOUNTER — Other Ambulatory Visit: Payer: Self-pay | Admitting: *Deleted

## 2015-04-27 ENCOUNTER — Emergency Department (HOSPITAL_COMMUNITY)
Admission: EM | Admit: 2015-04-27 | Discharge: 2015-04-27 | Disposition: A | Discharge status: Home / Self Care | Payer: Medicaid Other | Attending: Emergency Medicine | Admitting: Emergency Medicine

## 2015-04-27 ENCOUNTER — Encounter (HOSPITAL_COMMUNITY): Payer: Self-pay | Admitting: Family Medicine

## 2015-04-27 DIAGNOSIS — G4730 Sleep apnea, unspecified: Secondary | ICD-10-CM | POA: Insufficient documentation

## 2015-04-27 DIAGNOSIS — I2510 Atherosclerotic heart disease of native coronary artery without angina pectoris: Secondary | ICD-10-CM | POA: Insufficient documentation

## 2015-04-27 DIAGNOSIS — M159 Polyosteoarthritis, unspecified: Secondary | ICD-10-CM | POA: Insufficient documentation

## 2015-04-27 DIAGNOSIS — Z7952 Long term (current) use of systemic steroids: Secondary | ICD-10-CM | POA: Insufficient documentation

## 2015-04-27 DIAGNOSIS — E785 Hyperlipidemia, unspecified: Secondary | ICD-10-CM | POA: Diagnosis not present

## 2015-04-27 DIAGNOSIS — G8929 Other chronic pain: Secondary | ICD-10-CM | POA: Diagnosis not present

## 2015-04-27 DIAGNOSIS — Z8673 Personal history of transient ischemic attack (TIA), and cerebral infarction without residual deficits: Secondary | ICD-10-CM | POA: Insufficient documentation

## 2015-04-27 DIAGNOSIS — Z7982 Long term (current) use of aspirin: Secondary | ICD-10-CM | POA: Diagnosis not present

## 2015-04-27 DIAGNOSIS — K219 Gastro-esophageal reflux disease without esophagitis: Secondary | ICD-10-CM | POA: Insufficient documentation

## 2015-04-27 DIAGNOSIS — J45909 Unspecified asthma, uncomplicated: Secondary | ICD-10-CM | POA: Diagnosis not present

## 2015-04-27 DIAGNOSIS — Z95810 Presence of automatic (implantable) cardiac defibrillator: Secondary | ICD-10-CM | POA: Insufficient documentation

## 2015-04-27 DIAGNOSIS — L089 Local infection of the skin and subcutaneous tissue, unspecified: Secondary | ICD-10-CM | POA: Insufficient documentation

## 2015-04-27 DIAGNOSIS — Z794 Long term (current) use of insulin: Secondary | ICD-10-CM | POA: Insufficient documentation

## 2015-04-27 DIAGNOSIS — I5042 Chronic combined systolic (congestive) and diastolic (congestive) heart failure: Secondary | ICD-10-CM | POA: Diagnosis not present

## 2015-04-27 DIAGNOSIS — I10 Essential (primary) hypertension: Secondary | ICD-10-CM | POA: Diagnosis not present

## 2015-04-27 DIAGNOSIS — E1142 Type 2 diabetes mellitus with diabetic polyneuropathy: Secondary | ICD-10-CM | POA: Insufficient documentation

## 2015-04-27 DIAGNOSIS — M79672 Pain in left foot: Secondary | ICD-10-CM | POA: Diagnosis present

## 2015-04-27 DIAGNOSIS — Z9889 Other specified postprocedural states: Secondary | ICD-10-CM | POA: Diagnosis not present

## 2015-04-27 DIAGNOSIS — Z79899 Other long term (current) drug therapy: Secondary | ICD-10-CM | POA: Diagnosis not present

## 2015-04-27 DIAGNOSIS — Z87440 Personal history of urinary (tract) infections: Secondary | ICD-10-CM | POA: Insufficient documentation

## 2015-04-27 LAB — I-STAT CHEM 8, ED
BUN: 12 mg/dL (ref 6–20)
CHLORIDE: 99 mmol/L — AB (ref 101–111)
Calcium, Ion: 1.25 mmol/L — ABNORMAL HIGH (ref 1.12–1.23)
Creatinine, Ser: 0.7 mg/dL (ref 0.44–1.00)
Glucose, Bld: 141 mg/dL — ABNORMAL HIGH (ref 65–99)
HCT: 38 % (ref 36.0–46.0)
Hemoglobin: 12.9 g/dL (ref 12.0–15.0)
Potassium: 3.8 mmol/L (ref 3.5–5.1)
Sodium: 138 mmol/L (ref 135–145)
TCO2: 23 mmol/L (ref 0–100)

## 2015-04-27 LAB — CBC
HCT: 35.4 % — ABNORMAL LOW (ref 36.0–46.0)
Hemoglobin: 11.5 g/dL — ABNORMAL LOW (ref 12.0–15.0)
MCH: 26.8 pg (ref 26.0–34.0)
MCHC: 32.5 g/dL (ref 30.0–36.0)
MCV: 82.5 fL (ref 78.0–100.0)
PLATELETS: 221 10*3/uL (ref 150–400)
RBC: 4.29 MIL/uL (ref 3.87–5.11)
RDW: 13 % (ref 11.5–15.5)
WBC: 7.7 10*3/uL (ref 4.0–10.5)

## 2015-04-27 LAB — DIGOXIN LEVEL: Digoxin Level: <0.2 ng/mL — ABNORMAL LOW (ref 0.8–2.0)

## 2015-04-27 MED ORDER — SULFAMETHOXAZOLE-TRIMETHOPRIM 800-160 MG PO TABS
1.0000 | ORAL_TABLET | Freq: Once | ORAL | Status: AC
Start: 2015-04-27 — End: 2015-04-27
  Administered 2015-04-27: 1 via ORAL
  Filled 2015-04-27: qty 1

## 2015-04-27 MED ORDER — HYDROCODONE-ACETAMINOPHEN 5-325 MG PO TABS
2.0000 | ORAL_TABLET | Freq: Once | ORAL | Status: AC
  Administered 2015-04-27: 2 via ORAL
  Filled 2015-04-27: qty 2

## 2015-04-27 MED ORDER — HYDROCODONE-ACETAMINOPHEN 5-325 MG PO TABS: 2.0000 | ORAL_TABLET | ORAL | Status: DC | PRN

## 2015-04-27 MED ORDER — CYCLOBENZAPRINE HCL 10 MG PO TABS: 10.0000 mg | ORAL_TABLET | Freq: Two times a day (BID) | ORAL | Status: DC | PRN

## 2015-04-27 MED ORDER — SULFAMETHOXAZOLE-TRIMETHOPRIM 800-160 MG PO TABS: 1.0000 | ORAL_TABLET | Freq: Two times a day (BID) | ORAL | Status: AC

## 2015-04-27 NOTE — ED Provider Notes (Addendum)
CSN: [id number]     Arrival date & time 04/27/15  1103 History   First MD Initiated Contact with Patient 04/27/15 1122     Chief Complaint  Patient presents with  . Foot Pain     (Consider location/radiation/quality/duration/timing/severity/associated sxs/prior Treatment) HPI Comments: The patient is a 49 year old female, she has a history of a prior stroke, she has a history of DM and has a foot drop on the L.  The patient states that she has had a growing pain in her L foot which is over the L distal medial dorsal foot - she has poor sensation in this area and until the last couple of days did not notice that she had a rash in that area.  She has no other sx including fevers, vomiting, abd pain, sob, cp, headache.    Patient is a 49 y.o. female presenting with lower extremity pain. The history is provided by the patient.  Foot Pain    Past Medical History  Diagnosis Date  . Hypertension     Poorly controlled. Has had HTN since age 71. Angioedema with ACEI.  24 Hr urine and renal arterial dopplers ordered . . . Never done  . NICM (nonischemic cardiomyopathy)     EF 45-50% in 8/12, cath 6/12 showed normal coronaries, EF 50-55% by LV gram  . Morbid obesity   . Allergic rhinitis   . HLD (hyperlipidemia)   . OSA on CPAP     sleep study in 8/12 showed moderate to severe OSA requiring CPAP  . Chronic lower back pain     secondary to DJD, obsetiy, hip problems. Followed by Dr. Oval Linsey (pain management)  . Chronic diastolic heart failure      Primarily diastolic CHF: Likely due to uncontrolled HTN. Last echo (8/12) with EF 45-50%, mild to moderate LVH with some asymmetric septal hypertrophy, RV normal size and systolic function. EF 50-55% by LV-gram in 6/12.   . Polyneuropathy in diabetes(357.2)   . Thoracic or lumbosacral neuritis or radiculitis, unspecified   . Calcifying tendinitis of shoulder   . LBBB (left bundle branch block)   . Chronic combined systolic and diastolic CHF  (congestive heart failure)     EF 40-45% by echo 12/06/2012  . Coronary artery disease     questionable. LHC 05/2011 showing normal coronaries // Followed at [facility], Dr. Aundra Dubin  . GERD (gastroesophageal reflux disease)   . Shortness of breath   . Asthma   . Automatic implantable cardioverter-defibrillator in situ   . Stroke 12/2013    "my left side is paralyzed" (07/04/2014)  . Type II diabetes mellitus DX: 2002  . 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 Degeneration of lumbar or lumbosacral intervertebral disc   . Arthritis     "hips, back, legs, arms" (07/04/2014)  . Frequent UTI   . Liver disease    Past Surgical History  Procedure Laterality Date  . Tubal ligation  05/31/1985  . Carpal tunnel release Left   . Hemiarthroplasty shoulder fracture Right 1980's  . Multiple tooth extractions  ~ 2011    tumors removed ; "my whole top"  . Cardiac catheterization  05/2011  . Tee without cardioversion N/A 01/14/2014    Procedure: TRANSESOPHAGEAL ECHOCARDIOGRAM (TEE);  Surgeon: Sanda Klein, MD;  Location: [facility] ENDOSCOPY;  Service: Cardiovascular;  Laterality: N/A;  . Breast surgery Bilateral 2011    patient reports benign results  . Bi-ventricular implantable cardioverter defibrillator  (  crt-d)  08/2013    Archie Endo 08/23/2013  . Bi-ventricular implantable cardioverter defibrillator N/A 08/22/2013    Procedure: BI-VENTRICULAR IMPLANTABLE CARDIOVERTER DEFIBRILLATOR  (CRT-D);  Surgeon: Deboraha Sprang, MD;  Location: [facility] CATH LAB;  Service: Cardiovascular;  Laterality: N/A;   Family History  Problem Relation Age of Onset  . Heart disease Mother 8    Died of MI at age 97 yo  . Heart disease Paternal Grandmother     requiring pacemaker.  [name] Heart disease Paternal Grandfather 68    Died of MI at possibly age 37-53yo  . Stroke Paternal Grandfather   . Heart disease Father 7    MI age 35yo requiring stenting  . Kidney disease Mother     requiring dialysis  .  Congestive Heart Failure Mother   . Diabetes Father   . Diabetes Brother   . Heart disease Brother 39    MI at age 32 years old  . Breast cancer Paternal Aunt   . Breast cancer Maternal Grandmother   . Glaucoma Father    History  Substance Use Topics  . Smoking status: Never Smoker   . Smokeless tobacco: Never Used  . Alcohol Use: No   OB History    No data available     Review of Systems  All other systems reviewed and are negative.     Allergies  Clindamycin; Ace inhibitors; Enalapril maleate; and Lisinopril  Home Medications   Prior to Admission medications   Medication Sig Start Date End Date Taking? Authorizing Provider  amLODipine (NORVASC) 5 MG tablet Take 1 tablet (5 mg total) by mouth daily. 12/19/14   Deboraha Sprang, MD  aspirin 325 MG tablet Take 1 tablet (325 mg total) by mouth daily. 01/14/15   Corky Sox, MD  Blood Glucose Monitoring Suppl (ACCU-CHEK AVIVA PLUS) W/DEVICE KIT 1 each by Does not apply route 3 (three) times daily. 01/14/15   Corky Sox, MD  citalopram (CELEXA) 20 MG tablet Take 1 tablet (20 mg total) by mouth daily. 04/14/15 04/11/16  Corky Sox, MD  cyclobenzaprine (FLEXERIL) 10 MG tablet Take 1 tablet (10 mg total) by mouth 2 (two) times daily as needed for muscle spasms. 04/27/15   Noemi Chapel, MD  diclofenac sodium (VOLTAREN) 1 % GEL Apply 2 g topically 4 (four) times daily as needed (pain). 02/13/15   Meredith Staggers, MD  digoxin (LANOXIN) 0.125 MG tablet Take 1 tablet (0.125 mg total) by mouth daily. 02/25/15   Corky Sox, MD  fluticasone (CUTIVATE) 0.05 % cream Apply topically 2 (two) times daily. Dispense two tubes 02/25/15   Corky Sox, MD  furosemide (LASIX) 40 MG tablet Take 1 tablet (40 mg total) by mouth daily. 02/17/15   Larey Dresser, MD  glucose blood (ACCU-CHEK SMARTVIEW) test strip Use to Check Blood Sugar TID. Code: E11.40. 09/30/14   Corky Sox, MD  hydrALAZINE (APRESOLINE) 50 MG tablet Take 1.5 tablets (75 mg total) by mouth 3  (three) times daily. 02/17/15   Larey Dresser, MD  HYDROcodone-acetaminophen (NORCO/VICODIN) 5-325 MG per tablet Take 2 tablets by mouth every 4 (four) hours as needed. 04/27/15   Noemi Chapel, MD  Incontinence Supplies MISC 1 year supply of Pull-ups in size XL Diagnosis Code: 788.33 08/27/14   Corky Sox, MD  Insulin Glargine (LANTUS SOLOSTAR) 100 UNIT/ML Solostar Pen Inject 10 units in the AM, 35 units in the PM 10/01/14   Corky Sox, MD  Insulin  Pen Needle (GLOBAL EASE INJECT PEN NEEDLES) 31G X 5 MM MISC Use to inject insulin BID. Code: E 11.40 09/30/14   Courtney Paris, MD  ipratropium (ATROVENT) 0.06 % nasal spray Place 2 sprays into both nostrils 4 (four) times daily. Patient taking differently: Place 2 sprays into both nostrils daily as needed for rhinitis.  01/07/15   Linna Hoff, MD  isosorbide mononitrate (IMDUR) 60 MG 24 hr tablet Take 1 tablet (60 mg total) by mouth every morning. 08/21/14 08/21/15  Courtney Paris, MD  levETIRAcetam (KEPPRA) 500 MG tablet Take 1 tablet (500 mg total) by mouth 2 (two) times daily. 03/31/15   Earl Lagos, MD  metFORMIN (GLUCOPHAGE) 1000 MG tablet Take 1 tablet (1,000 mg total) by mouth daily with breakfast. 04/15/15   Courtney Paris, MD  metoprolol succinate (TOPROL-XL) 25 MG 24 hr tablet Take 75mg  (3 tablets) twice daily. 04/02/15   04/04/15, MD  nystatin cream (MYCOSTATIN) APPLY ONE APPLICATION TOPICALLY TWO TIMES DAILY 02/24/15   02/26/15, MD  oxyCODONE-acetaminophen (PERCOCET) 10-325 MG per tablet Take 1 tablet by mouth every 8 (eight) hours as needed for pain. 04/09/15   04/11/15, NP  pantoprazole (PROTONIX) 40 MG tablet Take 40 mg by mouth daily.    Historical Provider, MD  potassium chloride 20 MEQ TBCR Take 20 mEq by mouth daily. 08/07/14   08/09/14, NP  simvastatin (ZOCOR) 40 MG tablet Take 40 mg by mouth at bedtime.     Historical Provider, MD  sulfamethoxazole-trimethoprim (BACTRIM DS,SEPTRA DS) 800-160 MG per tablet Take 1  tablet by mouth 2 (two) times daily. 04/27/15 05/04/15  05/06/15, MD  Vitamin D, Ergocalciferol, (DRISDOL) 50000 UNITS CAPS capsule Take 1 capsule (50,000 Units total) by mouth every 7 (seven) days. 02/19/15   04/21/15, MD   BP 116/74 mmHg  Pulse 87  Temp(Src) 98 F (36.7 C)  Resp 18  SpO2 97%  LMP 04/10/2015 Physical Exam  Constitutional: She appears well-developed and well-nourished. No distress.  HENT:  Head: Normocephalic and atraumatic.  Mouth/Throat: Oropharynx is clear and moist. No oropharyngeal exudate.  Eyes: Conjunctivae and EOM are normal. Pupils are equal, round, and reactive to light. Right eye exhibits no discharge. Left eye exhibits no discharge. No scleral icterus.  Neck: Normal range of motion. Neck supple. No JVD present. No thyromegaly present.  Cardiovascular: Regular rhythm, normal heart sounds and intact distal pulses.  Exam reveals no gallop and no friction rub.   No murmur heard. HR of 105, normal pulses at the wrists and the feet.  Pulmonary/Chest: Effort normal and breath sounds normal. No respiratory distress. She has no wheezes. She has no rales.  Abdominal: Soft. Bowel sounds are normal. She exhibits no distension and no mass. There is no tenderness.  Musculoskeletal: Normal range of motion. She exhibits edema ( scant bilateral LE edema - symmetrical). She exhibits no tenderness.  Lymphadenopathy:    She has no cervical adenopathy.  Neurological: She is alert. Coordination normal.  Skin: Skin is warm and dry. Rash ( mild redness surrounding a vesicular / pustular lesion of the L foot.) noted. No erythema.  Psychiatric: She has a normal mood and affect. Her behavior is normal.  Nursing note and vitals reviewed.   ED Course  Procedures (including critical care time) Labs Review Labs Reviewed  CBC - Abnormal; Notable for the following:    Hemoglobin 11.5 (*)    HCT 35.4 (*)  All other components within normal limits  DIGOXIN LEVEL -  Abnormal; Notable for the following:    Digoxin Level <0.2 (*)    All other components within normal limits  I-STAT CHEM 8, ED - Abnormal; Notable for the following:    Chloride 99 (*)    Glucose, Bld 141 (*)    Calcium, Ion 1.25 (*)    All other components within normal limits    Imaging Review No results found.    MDM   Final diagnoses:  Infection of left foot    The pt is well appaering, afebrile - she has DM and prior stroke leaving her with poor sensation in her L foot for which she wears a shoe and brace to prevent foot drop - she likely has infected blistered area of skin - it is small - pain meds and antibiotics ordered.  Labs are normal - VS normal - pt appears stable for d/c.  Pt given results.  Meds given in ED:  Medications  HYDROcodone-acetaminophen (NORCO/VICODIN) 5-325 MG per tablet 2 tablet (2 tablets Oral Given 04/27/15 1205)  sulfamethoxazole-trimethoprim (BACTRIM DS,SEPTRA DS) 800-160 MG per tablet 1 tablet (1 tablet Oral Given 04/27/15 1205)    New Prescriptions   CYCLOBENZAPRINE (FLEXERIL) 10 MG TABLET    Take 1 tablet (10 mg total) by mouth 2 (two) times daily as needed for muscle spasms.   HYDROCODONE-ACETAMINOPHEN (NORCO/VICODIN) 5-325 MG PER TABLET    Take 2 tablets by mouth every 4 (four) hours as needed.   SULFAMETHOXAZOLE-TRIMETHOPRIM (BACTRIM DS,SEPTRA DS) 800-160 MG PER TABLET    Take 1 tablet by mouth 2 (two) times daily.      Noemi Chapel, MD 04/27/15 1243  Noemi Chapel, MD 04/27/15 1250

## 2015-04-27 NOTE — Discharge Instructions (Signed)
Please call your doctor for a followup appointment within 24-48 hours. When you talk to your doctor please let them know that you were seen in the emergency department and have them acquire all of your records so that they can discuss the findings with you and formulate a treatment plan to fully care for your new and ongoing problems.  Bactrim twice daily for 10 days Hydrocodone for severe pain

## 2015-04-27 NOTE — ED Notes (Signed)
Pt sts blister to left foot that is not healing. sts hx of diabetes.

## 2015-04-30 NOTE — Progress Notes (Signed)
Urine drug screen for this encounter is consistent for prescribed medication 

## 2015-05-05 ENCOUNTER — Other Ambulatory Visit: Payer: Self-pay | Admitting: *Deleted

## 2015-05-06 ENCOUNTER — Encounter: Payer: Medicaid Other | Attending: Physical Medicine & Rehabilitation | Admitting: Registered Nurse

## 2015-05-06 ENCOUNTER — Encounter: Payer: Self-pay | Admitting: Registered Nurse

## 2015-05-06 VITALS — BP 136/106 | HR 88 | Resp 16

## 2015-05-06 DIAGNOSIS — R5381 Other malaise: Secondary | ICD-10-CM

## 2015-05-06 DIAGNOSIS — Z79899 Other long term (current) drug therapy: Secondary | ICD-10-CM | POA: Insufficient documentation

## 2015-05-06 DIAGNOSIS — G629 Polyneuropathy, unspecified: Secondary | ICD-10-CM | POA: Insufficient documentation

## 2015-05-06 DIAGNOSIS — R2681 Unsteadiness on feet: Secondary | ICD-10-CM

## 2015-05-06 DIAGNOSIS — Z5181 Encounter for therapeutic drug level monitoring: Secondary | ICD-10-CM | POA: Diagnosis present

## 2015-05-06 DIAGNOSIS — I5042 Chronic combined systolic (congestive) and diastolic (congestive) heart failure: Secondary | ICD-10-CM | POA: Insufficient documentation

## 2015-05-06 DIAGNOSIS — E1142 Type 2 diabetes mellitus with diabetic polyneuropathy: Secondary | ICD-10-CM

## 2015-05-06 DIAGNOSIS — E1342 Other specified diabetes mellitus with diabetic polyneuropathy: Secondary | ICD-10-CM | POA: Diagnosis present

## 2015-05-06 DIAGNOSIS — M47817 Spondylosis without myelopathy or radiculopathy, lumbosacral region: Secondary | ICD-10-CM | POA: Diagnosis present

## 2015-05-06 DIAGNOSIS — I63319 Cerebral infarction due to thrombosis of unspecified middle cerebral artery: Secondary | ICD-10-CM | POA: Insufficient documentation

## 2015-05-06 DIAGNOSIS — G8114 Spastic hemiplegia affecting left nondominant side: Secondary | ICD-10-CM

## 2015-05-06 DIAGNOSIS — G8110 Spastic hemiplegia affecting unspecified side: Secondary | ICD-10-CM | POA: Diagnosis not present

## 2015-05-06 MED ORDER — OXYCODONE-ACETAMINOPHEN 10-325 MG PO TABS: 1.0000 | ORAL_TABLET | Freq: Three times a day (TID) | ORAL | Status: DC | PRN

## 2015-05-06 NOTE — Progress Notes (Signed)
Subjective:    Patient ID: [name], female    DOB: 1966/02/16, 49 y.o.   MRN: [id number]  HPI: Ms. TINYA Jacobs is a 49 year old female who returns for follow up for chronic pain and medication refill. She says her pain is located in her left arm and left leg.She rates her pain 9. Her current exercise regime is performing stretching exercises daily and walking with her 4 prong cane.   She arrived to office hypertensive, blood pressure re-checked 141/87. She went to [facility] ED on 04/27/15 for left foot pain. She was given a prescription for hydrocodone, script never filled. [place] Controlled Substance Reporting System Reviewed.   She has eschar noted left distal medial dorsal foot. Her sneaker is tight, encouraged to buy a larger sneaker she verbalizes understanding. Niece in room all questions answered.   Pain Inventory Average Pain 9 Pain Right Now 9 My pain is constant, sharp, burning and stabbing  In the last 24 hours, has pain interfered with the following? General activity 9 Relation with others 9 Enjoyment of life 9 What TIME of day is your pain at its worst? morning and night Sleep (in general) Fair  Pain is worse with: walking, bending and standing Pain improves with: rest Relief from Meds: 3  Mobility walk without assistance use a cane how many minutes can you walk? 2-3 ability to climb steps?  no do you drive?  no  Function disabled: date disabled .  Neuro/Psych bladder control problems numbness trouble walking spasms  Prior Studies Any changes since last visit?  no  Physicians involved in your care Any changes since last visit?  no   Family History  Problem Relation Age of Onset  . Heart disease Mother 16    Died of MI at age 86 yo  . Heart disease Paternal Grandmother     requiring pacemaker.  [name] Heart disease Paternal Grandfather 72    Died of MI at possibly age 8-53yo  . Stroke Paternal Grandfather   . Heart disease  Father 32    MI age 4yo requiring stenting  . Kidney disease Mother     requiring dialysis  . Congestive Heart Failure Mother   . Diabetes Father   . Diabetes Brother   . Heart disease Brother 64    MI at age 47 years old  . Breast cancer Paternal Aunt   . Breast cancer Maternal Grandmother   . Glaucoma Father    History   Social History  . Marital Status: Single    Spouse Name: N/A  . Number of Children: 2  . Years of Education: 9th grade   Occupational History  . Unemployed     planning on getting disability   Social History Main Topics  . Smoking status: Never Smoker   . Smokeless tobacco: Never Used  . Alcohol Use: No  . Drug Use: No  . Sexual Activity:    Partners: Male    Birth Control/ Protection: Surgical   Other Topics Concern  . None   Social History Narrative   [place] her [place]. Is able to read and write fluently in [place].   Past Surgical History  Procedure Laterality Date  . Tubal ligation  05/31/1985  . Carpal tunnel release Left   . Hemiarthroplasty shoulder fracture Right 1980's  . Multiple tooth extractions  ~ 2011    tumors removed ; "my whole top"  . Cardiac catheterization  05/2011  .  Tee without cardioversion N/A 01/14/2014    Procedure: TRANSESOPHAGEAL ECHOCARDIOGRAM (TEE);  Surgeon: Thurmon Fair, MD;  Location: [facility] ENDOSCOPY;  Service: Cardiovascular;  Laterality: N/A;  . Breast surgery Bilateral 2011    patient reports benign results  . Bi-ventricular implantable cardioverter defibrillator  (crt-d)  08/2013    [name] 08/23/2013  . Bi-ventricular implantable cardioverter defibrillator N/A 08/22/2013    Procedure: BI-VENTRICULAR IMPLANTABLE CARDIOVERTER DEFIBRILLATOR  (CRT-D);  Surgeon: [name], MD;  Location: [facility] CATH LAB;  Service: Cardiovascular;  Laterality: N/A;   Past Medical History  Diagnosis Date  . Hypertension     Poorly controlled. Has had HTN since age 71. Angioedema with ACEI.  24 Hr urine and renal  arterial dopplers ordered . . . Never done  . NICM (nonischemic cardiomyopathy)     EF 45-50% in 8/12, cath 6/12 showed normal coronaries, EF 50-55% by LV gram  . Morbid obesity   . Allergic rhinitis   . HLD (hyperlipidemia)   . OSA on CPAP     sleep study in 8/12 showed moderate to severe OSA requiring CPAP  . Chronic lower back pain     secondary to DJD, obsetiy, hip problems. Followed by Dr. Ivory Broad (pain management)  . Chronic diastolic heart failure      Primarily diastolic CHF: Likely due to uncontrolled HTN. Last echo (8/12) with EF 45-50%, mild to moderate LVH with some asymmetric septal hypertrophy, RV normal size and systolic function. EF 50-55% by LV-gram in 6/12.   . Polyneuropathy in diabetes(357.2)   . Thoracic or lumbosacral neuritis or radiculitis, unspecified   . Calcifying tendinitis of shoulder   . LBBB (left bundle branch block)   . Chronic combined systolic and diastolic CHF (congestive heart failure)     EF 40-45% by echo 12/06/2012  . Coronary artery disease     questionable. LHC 05/2011 showing normal coronaries // Followed at [facility] Cardiology, Dr. Shirlee Latch  . GERD (gastroesophageal reflux disease)   . Shortness of breath   . Asthma   . Automatic implantable cardioverter-defibrillator in situ   . Stroke 12/2013    "my left side is paralyzed" (07/04/2014)  . Type II diabetes mellitus DX: 2002  . 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 Degeneration of lumbar or lumbosacral intervertebral disc   . Arthritis     "hips, back, legs, arms" (07/04/2014)  . Frequent UTI   . Liver disease    BP 136/106 mmHg  Pulse 88  Resp 16  SpO2 99%  LMP 04/10/2015  Opioid Risk Score:   Fall Risk Score: Low Fall Risk (0-5 points)`1  Depression screen PHQ 2/9  Depression screen [facility] 2/9 04/22/2015 03/12/2015 02/17/2015 01/14/2015 11/05/2014 10/24/2014 10/01/2014  Decreased Interest 0 0 0 1 0 3 0  Down, Depressed, Hopeless 0 1 0 1 0 3 0  PHQ - 2 Score 0 1 0  2 0 6 0  Altered sleeping - 1 - 1 - 2 -  Tired, decreased energy - 1 - 1 - 3 -  Change in appetite - 1 - 3 - 2 -  Feeling bad or failure about yourself  - 1 - 0 - 0 -  Trouble concentrating - 1 - 3 - 0 -  Moving slowly or fidgety/restless - 1 - 0 - 2 -  Suicidal thoughts - 0 - 0 - 0 -  PHQ-9 Score - 7 - 10 - 15 -     Review  of Systems  Constitutional: Negative.        Fever/chills, skin rash breakdown  HENT: Negative.   Eyes: Negative.   Respiratory: Negative.   Cardiovascular: Negative.   Gastrointestinal: Positive for nausea.  Endocrine: Negative.   Genitourinary: Negative.        Bladder control problems  Musculoskeletal: Positive for myalgias, back pain and arthralgias.  Skin: Negative.   Allergic/Immunologic: Negative.   Neurological: Positive for numbness.       Trouble walking, spasms  Hematological: Negative.   Psychiatric/Behavioral: Negative.        Objective:   Physical Exam  Constitutional: She is oriented to person, place, and time. She appears well-developed and well-nourished.  HENT:  Head: Normocephalic and atraumatic.  Neck: Normal range of motion. Neck supple.  Cardiovascular: Normal rate and regular rhythm.   Pulmonary/Chest: Effort normal and breath sounds normal.  Musculoskeletal: She exhibits edema.  Normal Muscle Bulk and Muscle Testing Reveals: Upper Extremities: Right: Full ROM and Muscle Strength 5/5 Left: Hemiparesis:  Thoracic Paraspinal Tenderness: T-3- T-7  ( Mainly Left Side) Lower Extremities: Right Full ROM and Muscle Strength 5/5 Left: Decreased ROM and Muscle Strength: Left: AFO Transfer to Wheelchair and escorted to car  Neurological: She is alert and oriented to person, place, and time.  Skin: Skin is warm and dry.  Psychiatric: She has a normal mood and affect.  Nursing note and vitals reviewed.         Assessment & Plan:  1. Chronic low back pain: Continue Current Medication Regime. Refilled: oxyCODONE 10/325mg  one  tablet every 8 hours as needed.#90.  2. Diabetic peripheral neuropathy: Continue to monitor  3. Recent Right MCA infarct: Left leg with spasms. Continue Baclofen..Botox injection with Dr. Riley Kill in June. 4. OA: Continue Voltaren gel   20 minutes of face to face patient care time was spent during this visit. All questions were encouraged and answered.  F/U in 1 month

## 2015-05-08 MED ORDER — ACCU-CHEK FASTCLIX LANCETS MISC: Status: DC

## 2015-05-18 NOTE — Progress Notes (Signed)
INTERNAL MEDICINE TEACHING ATTENDING ADDENDUM - Kaisley Stiverson, MD: I reviewed and discussed at the time of visit with the resident Dr. Jones, the patient's medical history, physical examination, diagnosis and results of pertinent tests and treatment and I agree with the patient's care as documented.  

## 2015-05-26 ENCOUNTER — Encounter: Payer: Self-pay | Admitting: Internal Medicine

## 2015-05-27 ENCOUNTER — Encounter (HOSPITAL_COMMUNITY): Payer: Self-pay

## 2015-05-27 ENCOUNTER — Emergency Department (HOSPITAL_COMMUNITY): Payer: Medicaid Other

## 2015-05-27 ENCOUNTER — Emergency Department (HOSPITAL_COMMUNITY)
Admission: EM | Admit: 2015-05-27 | Discharge: 2015-05-27 | Disposition: A | Discharge status: Home / Self Care | Payer: Medicaid Other | Attending: Emergency Medicine | Admitting: Emergency Medicine

## 2015-05-27 DIAGNOSIS — I5042 Chronic combined systolic (congestive) and diastolic (congestive) heart failure: Secondary | ICD-10-CM | POA: Diagnosis not present

## 2015-05-27 DIAGNOSIS — I10 Essential (primary) hypertension: Secondary | ICD-10-CM | POA: Insufficient documentation

## 2015-05-27 DIAGNOSIS — G4733 Obstructive sleep apnea (adult) (pediatric): Secondary | ICD-10-CM | POA: Diagnosis not present

## 2015-05-27 DIAGNOSIS — Z79899 Other long term (current) drug therapy: Secondary | ICD-10-CM | POA: Diagnosis not present

## 2015-05-27 DIAGNOSIS — G8929 Other chronic pain: Secondary | ICD-10-CM | POA: Diagnosis not present

## 2015-05-27 DIAGNOSIS — Z95810 Presence of automatic (implantable) cardiac defibrillator: Secondary | ICD-10-CM | POA: Diagnosis not present

## 2015-05-27 DIAGNOSIS — M6281 Muscle weakness (generalized): Secondary | ICD-10-CM | POA: Insufficient documentation

## 2015-05-27 DIAGNOSIS — Z87440 Personal history of urinary (tract) infections: Secondary | ICD-10-CM | POA: Diagnosis not present

## 2015-05-27 DIAGNOSIS — Y998 Other external cause status: Secondary | ICD-10-CM | POA: Insufficient documentation

## 2015-05-27 DIAGNOSIS — M25562 Pain in left knee: Secondary | ICD-10-CM

## 2015-05-27 DIAGNOSIS — Y9389 Activity, other specified: Secondary | ICD-10-CM | POA: Diagnosis not present

## 2015-05-27 DIAGNOSIS — J45909 Unspecified asthma, uncomplicated: Secondary | ICD-10-CM | POA: Diagnosis not present

## 2015-05-27 DIAGNOSIS — Z7952 Long term (current) use of systemic steroids: Secondary | ICD-10-CM | POA: Insufficient documentation

## 2015-05-27 DIAGNOSIS — Z9981 Dependence on supplemental oxygen: Secondary | ICD-10-CM | POA: Insufficient documentation

## 2015-05-27 DIAGNOSIS — Z7982 Long term (current) use of aspirin: Secondary | ICD-10-CM | POA: Diagnosis not present

## 2015-05-27 DIAGNOSIS — K219 Gastro-esophageal reflux disease without esophagitis: Secondary | ICD-10-CM | POA: Diagnosis not present

## 2015-05-27 DIAGNOSIS — I2510 Atherosclerotic heart disease of native coronary artery without angina pectoris: Secondary | ICD-10-CM | POA: Insufficient documentation

## 2015-05-27 DIAGNOSIS — S79912A Unspecified injury of left hip, initial encounter: Secondary | ICD-10-CM | POA: Insufficient documentation

## 2015-05-27 DIAGNOSIS — E785 Hyperlipidemia, unspecified: Secondary | ICD-10-CM | POA: Diagnosis not present

## 2015-05-27 DIAGNOSIS — S8992XA Unspecified injury of left lower leg, initial encounter: Secondary | ICD-10-CM | POA: Insufficient documentation

## 2015-05-27 DIAGNOSIS — M1611 Unilateral primary osteoarthritis, right hip: Secondary | ICD-10-CM | POA: Diagnosis not present

## 2015-05-27 DIAGNOSIS — Z8673 Personal history of transient ischemic attack (TIA), and cerebral infarction without residual deficits: Secondary | ICD-10-CM | POA: Insufficient documentation

## 2015-05-27 DIAGNOSIS — Y92410 Unspecified street and highway as the place of occurrence of the external cause: Secondary | ICD-10-CM | POA: Diagnosis not present

## 2015-05-27 DIAGNOSIS — M25552 Pain in left hip: Secondary | ICD-10-CM

## 2015-05-27 DIAGNOSIS — Z794 Long term (current) use of insulin: Secondary | ICD-10-CM | POA: Diagnosis not present

## 2015-05-27 DIAGNOSIS — E1142 Type 2 diabetes mellitus with diabetic polyneuropathy: Secondary | ICD-10-CM | POA: Diagnosis not present
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
## 2015-05-27 NOTE — ED Notes (Signed)
CBG Taken = 130
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
## 2015-05-28 LAB — CBG MONITORING, ED: Glucose-Capillary: 130 mg/dL — ABNORMAL HIGH (ref 65–99)

## 2015-06-06 ENCOUNTER — Emergency Department (HOSPITAL_COMMUNITY)
Admission: EM | Admit: 2015-06-06 | Discharge: 2015-06-06 | Disposition: A | Discharge status: Home / Self Care | Payer: Medicaid Other | Attending: Emergency Medicine | Admitting: Emergency Medicine

## 2015-06-06 ENCOUNTER — Emergency Department (HOSPITAL_COMMUNITY): Payer: Medicaid Other

## 2015-06-06 ENCOUNTER — Encounter: Payer: Medicaid Other | Attending: Physical Medicine & Rehabilitation | Admitting: Physical Medicine & Rehabilitation

## 2015-06-06 ENCOUNTER — Encounter (HOSPITAL_COMMUNITY): Payer: Self-pay | Admitting: Emergency Medicine

## 2015-06-06 ENCOUNTER — Encounter: Payer: Self-pay | Admitting: Physical Medicine & Rehabilitation

## 2015-06-06 VITALS — BP 132/86 | HR 88 | Resp 14

## 2015-06-06 DIAGNOSIS — Z7952 Long term (current) use of systemic steroids: Secondary | ICD-10-CM | POA: Diagnosis not present

## 2015-06-06 DIAGNOSIS — I5042 Chronic combined systolic (congestive) and diastolic (congestive) heart failure: Secondary | ICD-10-CM | POA: Diagnosis present

## 2015-06-06 DIAGNOSIS — E1342 Other specified diabetes mellitus with diabetic polyneuropathy: Secondary | ICD-10-CM | POA: Diagnosis present

## 2015-06-06 DIAGNOSIS — Z5181 Encounter for therapeutic drug level monitoring: Secondary | ICD-10-CM | POA: Insufficient documentation

## 2015-06-06 DIAGNOSIS — N390 Urinary tract infection, site not specified: Secondary | ICD-10-CM | POA: Diagnosis not present

## 2015-06-06 DIAGNOSIS — G8110 Spastic hemiplegia affecting unspecified side: Secondary | ICD-10-CM

## 2015-06-06 DIAGNOSIS — R569 Unspecified convulsions: Secondary | ICD-10-CM | POA: Insufficient documentation

## 2015-06-06 DIAGNOSIS — Z794 Long term (current) use of insulin: Secondary | ICD-10-CM | POA: Diagnosis not present

## 2015-06-06 DIAGNOSIS — Z79899 Other long term (current) drug therapy: Secondary | ICD-10-CM | POA: Insufficient documentation

## 2015-06-06 DIAGNOSIS — E1142 Type 2 diabetes mellitus with diabetic polyneuropathy: Secondary | ICD-10-CM | POA: Insufficient documentation

## 2015-06-06 DIAGNOSIS — M47817 Spondylosis without myelopathy or radiculopathy, lumbosacral region: Secondary | ICD-10-CM | POA: Diagnosis present

## 2015-06-06 DIAGNOSIS — I2510 Atherosclerotic heart disease of native coronary artery without angina pectoris: Secondary | ICD-10-CM | POA: Diagnosis not present

## 2015-06-06 DIAGNOSIS — Z3202 Encounter for pregnancy test, result negative: Secondary | ICD-10-CM | POA: Diagnosis not present

## 2015-06-06 DIAGNOSIS — Z9889 Other specified postprocedural states: Secondary | ICD-10-CM | POA: Diagnosis not present

## 2015-06-06 DIAGNOSIS — I10 Essential (primary) hypertension: Secondary | ICD-10-CM | POA: Insufficient documentation

## 2015-06-06 DIAGNOSIS — Z8673 Personal history of transient ischemic attack (TIA), and cerebral infarction without residual deficits: Secondary | ICD-10-CM | POA: Diagnosis not present

## 2015-06-06 DIAGNOSIS — J45909 Unspecified asthma, uncomplicated: Secondary | ICD-10-CM | POA: Diagnosis not present

## 2015-06-06 DIAGNOSIS — G4733 Obstructive sleep apnea (adult) (pediatric): Secondary | ICD-10-CM | POA: Insufficient documentation

## 2015-06-06 DIAGNOSIS — Z95810 Presence of automatic (implantable) cardiac defibrillator: Secondary | ICD-10-CM | POA: Diagnosis not present

## 2015-06-06 DIAGNOSIS — K219 Gastro-esophageal reflux disease without esophagitis: Secondary | ICD-10-CM | POA: Insufficient documentation

## 2015-06-06 DIAGNOSIS — G8929 Other chronic pain: Secondary | ICD-10-CM | POA: Insufficient documentation

## 2015-06-06 DIAGNOSIS — I63319 Cerebral infarction due to thrombosis of unspecified middle cerebral artery: Secondary | ICD-10-CM | POA: Diagnosis present

## 2015-06-06 DIAGNOSIS — M159 Polyosteoarthritis, unspecified: Secondary | ICD-10-CM | POA: Insufficient documentation

## 2015-06-06 DIAGNOSIS — G629 Polyneuropathy, unspecified: Secondary | ICD-10-CM | POA: Diagnosis present

## 2015-06-06 DIAGNOSIS — G8114 Spastic hemiplegia affecting left nondominant side: Secondary | ICD-10-CM

## 2015-06-06 DIAGNOSIS — E785 Hyperlipidemia, unspecified: Secondary | ICD-10-CM | POA: Insufficient documentation

## 2015-06-06 DIAGNOSIS — Z7982 Long term (current) use of aspirin: Secondary | ICD-10-CM | POA: Insufficient documentation

## 2015-06-06 LAB — URINALYSIS, ROUTINE W REFLEX MICROSCOPIC
Bilirubin Urine: NEGATIVE
Glucose, UA: NEGATIVE mg/dL
HGB URINE DIPSTICK: NEGATIVE
KETONES UR: NEGATIVE mg/dL
LEUKOCYTES UA: NEGATIVE
Nitrite: POSITIVE — AB
Protein, ur: NEGATIVE mg/dL
Specific Gravity, Urine: 1.012 (ref 1.005–1.030)
Urobilinogen, UA: 1 mg/dL (ref 0.0–1.0)
pH: 7.5 (ref 5.0–8.0)

## 2015-06-06 LAB — RAPID URINE DRUG SCREEN, HOSP PERFORMED
Amphetamines: NOT DETECTED
BARBITURATES: NOT DETECTED
Benzodiazepines: NOT DETECTED
Cocaine: NOT DETECTED
Opiates: NOT DETECTED
Tetrahydrocannabinol: NOT DETECTED

## 2015-06-06 LAB — CBC WITH DIFFERENTIAL/PLATELET
BASOS ABS: 0 10*3/uL (ref 0.0–0.1)
Basophils Relative: 0 % (ref 0–1)
EOS ABS: 0.1 10*3/uL (ref 0.0–0.7)
EOS PCT: 1 % (ref 0–5)
HCT: 38.8 % (ref 36.0–46.0)
HEMOGLOBIN: 12.6 g/dL (ref 12.0–15.0)
LYMPHS ABS: 1.7 10*3/uL (ref 0.7–4.0)
Lymphocytes Relative: 15 % (ref 12–46)
MCH: 27.1 pg (ref 26.0–34.0)
MCHC: 32.5 g/dL (ref 30.0–36.0)
MCV: 83.4 fL (ref 78.0–100.0)
MONOS PCT: 7 % (ref 3–12)
Monocytes Absolute: 0.8 10*3/uL (ref 0.1–1.0)
Neutro Abs: 9 10*3/uL — ABNORMAL HIGH (ref 1.7–7.7)
Neutrophils Relative %: 77 % (ref 43–77)
Platelets: 222 10*3/uL (ref 150–400)
RBC: 4.65 MIL/uL (ref 3.87–5.11)
RDW: 13.4 % (ref 11.5–15.5)
WBC: 11.6 10*3/uL — ABNORMAL HIGH (ref 4.0–10.5)

## 2015-06-06 LAB — COMPREHENSIVE METABOLIC PANEL
ALT: 12 U/L — AB (ref 14–54)
ANION GAP: 7 (ref 5–15)
AST: 16 U/L (ref 15–41)
Albumin: 4.1 g/dL (ref 3.5–5.0)
Alkaline Phosphatase: 63 U/L (ref 38–126)
BUN: 12 mg/dL (ref 6–20)
CO2: 27 mmol/L (ref 22–32)
Calcium: 8.8 mg/dL — ABNORMAL LOW (ref 8.9–10.3)
Chloride: 102 mmol/L (ref 101–111)
Creatinine, Ser: 0.82 mg/dL (ref 0.44–1.00)
GFR calc Af Amer: >60 mL/min (ref >60)
GFR calc non Af Amer: >60 mL/min (ref >60)
Glucose, Bld: 173 mg/dL — ABNORMAL HIGH (ref 65–99)
Potassium: 3.9 mmol/L (ref 3.5–5.1)
Sodium: 136 mmol/L (ref 135–145)
Total Bilirubin: 0.6 mg/dL (ref 0.3–1.2)
Total Protein: 7.3 g/dL (ref 6.5–8.1)

## 2015-06-06 LAB — PROTIME-INR
INR: 1.1 (ref 0.00–1.49)
Prothrombin Time: 14.4 seconds (ref 11.6–15.2)

## 2015-06-06 LAB — URINE MICROSCOPIC-ADD ON

## 2015-06-06 LAB — POC URINE PREG, ED: Preg Test, Ur: NEGATIVE

## 2015-06-06 LAB — CBG MONITORING, ED: GLUCOSE-CAPILLARY: 152 mg/dL — AB (ref 65–99)

## 2015-06-06 MED ORDER — CEPHALEXIN 500 MG PO CAPS: 500.0000 mg | ORAL_CAPSULE | Freq: Four times a day (QID) | ORAL | Status: DC

## 2015-06-06 MED ORDER — CEPHALEXIN 500 MG PO CAPS
500.0000 mg | ORAL_CAPSULE | Freq: Once | ORAL | Status: AC
  Administered 2015-06-06: 500 mg via ORAL
  Filled 2015-06-06: qty 1

## 2015-06-06 MED ORDER — SODIUM CHLORIDE 0.9 % IV SOLN
1000.0000 mg | Freq: Once | INTRAVENOUS | Status: AC
  Administered 2015-06-06: 1000 mg via INTRAVENOUS
  Filled 2015-06-06: qty 10

## 2015-06-06 NOTE — ED Notes (Signed)
Pt informed that a urine specimen is needed. 

## 2015-06-06 NOTE — ED Provider Notes (Signed)
CSN: [id number]     Arrival date & time 06/06/15  1258 History   First MD Initiated Contact with Patient 06/06/15 1314     Chief Complaint  Patient presents with  . Seizures  . Hypertension     (Consider location/radiation/quality/duration/timing/severity/associated sxs/prior Treatment) Patient is a 49 y.o. female presenting with seizures and hypertension.  Seizures Seizure activity on arrival: no   Seizure type:  Grand mal Preceding symptoms: aura   Initial focality:  Left-sided Episode characteristics: focal shaking, generalized shaking and unresponsiveness   Postictal symptoms: confusion and somnolence   Return to baseline: yes   Severity:  Moderate Duration:  3 minutes Timing:  Once Number of seizures this episode:  1 Progression:  Unchanged Context comment:  Missed keppra dose this am Recent head injury:  No recent head injuries PTA treatment:  None History of seizures: yes   Similar to previous episodes: yes   Severity:  Moderate Seizure control level:  Well controlled Current therapy:  Levetiracetam Compliance with current therapy:  Good Hypertension    Past Medical History  Diagnosis Date  . Hypertension     Poorly controlled. Has had HTN since age 48. Angioedema with ACEI.  24 Hr urine and renal arterial dopplers ordered . . . Never done  . NICM (nonischemic cardiomyopathy)     EF 45-50% in 8/12, cath 6/12 showed normal coronaries, EF 50-55% by LV gram  . Morbid obesity   . Allergic rhinitis   . HLD (hyperlipidemia)   . OSA on CPAP     sleep study in 8/12 showed moderate to severe OSA requiring CPAP  . Chronic lower back pain     secondary to DJD, obsetiy, hip problems. Followed by Dr. Oval Linsey (pain management)  . Chronic diastolic heart failure      Primarily diastolic CHF: Likely due to uncontrolled HTN. Last echo (8/12) with EF 45-50%, mild to moderate LVH with some asymmetric septal hypertrophy, RV normal size and systolic function. EF 50-55% by  LV-gram in 6/12.   . Polyneuropathy in diabetes(357.2)   . Thoracic or lumbosacral neuritis or radiculitis, unspecified   . Calcifying tendinitis of shoulder   . LBBB (left bundle branch block)   . Chronic combined systolic and diastolic CHF (congestive heart failure)     EF 40-45% by echo 12/06/2012  . Coronary artery disease     questionable. LHC 05/2011 showing normal coronaries // Followed at [facility], Dr. Aundra Dubin  . GERD (gastroesophageal reflux disease)   . Shortness of breath   . Asthma   . Automatic implantable cardioverter-defibrillator in situ   . Stroke 12/2013    "my left side is paralyzed" (07/04/2014)  . Type II diabetes mellitus DX: 2002  . 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 Degeneration of lumbar or lumbosacral intervertebral disc   . Arthritis     "hips, back, legs, arms" (07/04/2014)  . Frequent UTI   . Liver disease    Past Surgical History  Procedure Laterality Date  . Tubal ligation  05/31/1985  . Carpal tunnel release Left   . Hemiarthroplasty shoulder fracture Right 1980's  . Multiple tooth extractions  ~ 2011    tumors removed ; "my whole top"  . Cardiac catheterization  05/2011  . Tee without cardioversion N/A 01/14/2014    Procedure: TRANSESOPHAGEAL ECHOCARDIOGRAM (TEE);  Surgeon: Sanda Klein, MD;  Location: [facility] ENDOSCOPY;  Service: Cardiovascular;  Laterality: N/A;  . Breast surgery Bilateral 2011  patient reports benign results  . Bi-ventricular implantable cardioverter defibrillator  (crt-d)  08/2013    [name] 08/23/2013  . Bi-ventricular implantable cardioverter defibrillator N/A 08/22/2013    Procedure: BI-VENTRICULAR IMPLANTABLE CARDIOVERTER DEFIBRILLATOR  (CRT-D);  Surgeon: [name], MD;  Location: [facility] CATH LAB;  Service: Cardiovascular;  Laterality: N/A;   Family History  Problem Relation Age of Onset  . Heart disease Mother 58    Died of MI at age 58 yo  . Heart disease Paternal Grandmother     requiring  pacemaker.  [name] Heart disease Paternal Grandfather 59    Died of MI at possibly age 30-53yo  . Stroke Paternal Grandfather   . Heart disease Father 16    MI age 49yo requiring stenting  . Kidney disease Mother     requiring dialysis  . Congestive Heart Failure Mother   . Diabetes Father   . Diabetes Brother   . Heart disease Brother 80    MI at age 16 years old  . Breast cancer Paternal Aunt   . Breast cancer Maternal Grandmother   . Glaucoma Father    History  Substance Use Topics  . Smoking status: Never Smoker   . Smokeless tobacco: Never Used  . Alcohol Use: No   OB History    No data available     Review of Systems  Neurological: Positive for seizures.  All other systems reviewed and are negative.     Allergies  Clindamycin; Ace inhibitors; Enalapril maleate; and Lisinopril  Home Medications   Prior to Admission medications   Medication Sig Start Date End Date Taking? Authorizing Provider  ACCU-CHEK FASTCLIX LANCETS MISC Use 1-2 times daily when checking blood sugar. ICD10: E11.9 05/08/15  Yes Courtney Paris, MD  amLODipine (NORVASC) 5 MG tablet Take 1 tablet (5 mg total) by mouth daily. 12/19/14  Yes [name], MD  aspirin 325 MG tablet Take 1 tablet (325 mg total) by mouth daily. 01/14/15  Yes Courtney Paris, MD  Blood Glucose Monitoring Suppl (ACCU-CHEK AVIVA PLUS) W/DEVICE KIT 1 each by Does not apply route 3 (three) times daily. 01/14/15  Yes Courtney Paris, MD  citalopram (CELEXA) 20 MG tablet Take 1 tablet (20 mg total) by mouth daily. 04/14/15 04/11/16 Yes Courtney Paris, MD  cyclobenzaprine (FLEXERIL) 10 MG tablet Take 1 tablet (10 mg total) by mouth 2 (two) times daily as needed for muscle spasms. 04/27/15  Yes Eber Hong, MD  diclofenac sodium (VOLTAREN) 1 % GEL Apply 2 g topically 4 (four) times daily as needed (pain). 02/13/15  Yes Ranelle Oyster, MD  digoxin (LANOXIN) 0.125 MG tablet Take 1 tablet (0.125 mg total) by mouth daily. 02/25/15  Yes Courtney Paris, MD   fluticasone (CUTIVATE) 0.05 % cream Apply topically 2 (two) times daily. Dispense two tubes 02/25/15  Yes Courtney Paris, MD  furosemide (LASIX) 40 MG tablet Take 1 tablet (40 mg total) by mouth daily. Patient taking differently: Take 40 mg by mouth daily as needed for fluid or edema.  02/17/15  Yes Laurey Morale, MD  glucose blood (ACCU-CHEK SMARTVIEW) test strip Use to Check Blood Sugar TID. Code: E11.40. 09/30/14  Yes Courtney Paris, MD  hydrALAZINE (APRESOLINE) 50 MG tablet Take 1.5 tablets (75 mg total) by mouth 3 (three) times daily. 02/17/15  Yes Laurey Morale, MD  ibuprofen (ADVIL,MOTRIN) 200 MG tablet Take 200 mg by mouth every 6 (six) hours as needed for moderate pain.  Yes Historical Provider, MD  Incontinence Supplies MISC 1 year supply of Pull-ups in size XL Diagnosis Code: 788.33 08/27/14  Yes Corky Sox, MD  Insulin Glargine (LANTUS SOLOSTAR) 100 UNIT/ML Solostar Pen Inject 10 units in the AM, 35 units in the PM 10/01/14  Yes Corky Sox, MD  Insulin Pen Needle (GLOBAL EASE INJECT PEN NEEDLES) 31G X 5 MM MISC Use to inject insulin BID. Code: E 11.40 09/30/14  Yes Corky Sox, MD  ipratropium (ATROVENT) 0.06 % nasal spray Place 2 sprays into both nostrils 4 (four) times daily. Patient taking differently: Place 2 sprays into both nostrils daily as needed for rhinitis.  01/07/15  Yes Billy Fischer, MD  levETIRAcetam (KEPPRA) 500 MG tablet Take 1 tablet (500 mg total) by mouth 2 (two) times daily. 03/31/15  Yes Aldine Contes, MD  metFORMIN (GLUCOPHAGE) 1000 MG tablet Take 1 tablet (1,000 mg total) by mouth daily with breakfast. 04/15/15  Yes Corky Sox, MD  metoprolol succinate (TOPROL-XL) 25 MG 24 hr tablet Take $RemoveBef'75mg'VXDPkZrlQy$  (3 tablets) twice daily. 04/02/15  Yes Jolaine Artist, MD  nystatin cream (MYCOSTATIN) APPLY ONE APPLICATION TOPICALLY TWO TIMES DAILY 02/24/15  Yes Corky Sox, MD  oxyCODONE-acetaminophen (PERCOCET) 10-325 MG per tablet Take 1 tablet by mouth every 8 (eight) hours as  needed for pain. 05/06/15  Yes Bayard Hugger, NP  pantoprazole (PROTONIX) 40 MG tablet Take 40 mg by mouth daily.   Yes Historical Provider, MD  potassium chloride 20 MEQ TBCR Take 20 mEq by mouth daily. 08/07/14  Yes Rande Brunt, NP  simvastatin (ZOCOR) 40 MG tablet Take 40 mg by mouth at bedtime.    Yes Historical Provider, MD  Vitamin D, Ergocalciferol, (DRISDOL) 50000 UNITS CAPS capsule Take 1 capsule (50,000 Units total) by mouth every 7 (seven) days. Patient taking differently: Take 50,000 Units by mouth every Monday.  02/19/15  Yes Annia Belt, MD  cephALEXin (KEFLEX) 500 MG capsule Take 1 capsule (500 mg total) by mouth 4 (four) times daily. 06/06/15   Nat Christen, MD   BP 144/87 mmHg  Pulse 105  Temp(Src) 97.6 F (36.4 C) (Oral)  Resp 18  SpO2 96%  LMP 05/17/2015 Physical Exam  Constitutional: She is oriented to person, place, and time. She appears well-developed and well-nourished.  HENT:  Head: Normocephalic and atraumatic.  Right Ear: External ear normal.  Left Ear: External ear normal.  Eyes: Conjunctivae and EOM are normal. Pupils are equal, round, and reactive to light.  Neck: Normal range of motion. Neck supple.  Cardiovascular: Normal rate, regular rhythm, normal heart sounds and intact distal pulses.   Pulmonary/Chest: Effort normal and breath sounds normal.  Abdominal: Soft. Bowel sounds are normal. There is no tenderness.  Musculoskeletal: Normal range of motion.  Neurological: She is alert and oriented to person, place, and time.  1/5 strength on L, consistent with baseline  Skin: Skin is warm and dry.  Vitals reviewed.   ED Course  Procedures (including critical care time) Labs Review Labs Reviewed  COMPREHENSIVE METABOLIC PANEL - Abnormal; Notable for the following:    Glucose, Bld 173 (*)    Calcium 8.8 (*)    ALT 12 (*)    All other components within normal limits  CBC WITH DIFFERENTIAL/PLATELET - Abnormal; Notable for the following:    WBC  11.6 (*)    Neutro Abs 9.0 (*)    All other components within normal limits  URINALYSIS, ROUTINE W REFLEX MICROSCOPIC (  NOT AT [facility]) - Abnormal; Notable for the following:    Nitrite POSITIVE (*)    All other components within normal limits  URINE MICROSCOPIC-ADD ON - Abnormal; Notable for the following:    Squamous Epithelial / LPF FEW (*)    Bacteria, UA MANY (*)    All other components within normal limits  CBG MONITORING, ED - Abnormal; Notable for the following:    Glucose-Capillary 152 (*)    All other components within normal limits  URINE CULTURE  URINE RAPID DRUG SCREEN, HOSP PERFORMED  PROTIME-INR  POC URINE PREG, ED    Imaging Review No results found.   EKG Interpretation   Date/Time:  Friday June 06 2015 13:28:56 EDT Ventricular Rate:  101 PR Interval:  169 QRS Duration: 116 QT Interval:  427 QTC Calculation: 554 R Axis:   -112 Text Interpretation:  Atrial-sensed ventricular-paced rhythm No further  analysis attempted due to paced rhythm No significant change since last  tracing Confirmed by Debby Freiberg [phone]) on 06/06/2015 2:55:31 PM      MDM   Final diagnoses:  Seizure  UTI (lower urinary tract infection)    49 y.o. female with pertinent PMH of CVA with L sided paralysis, seizures presents with recurrent seizure while at pain clinic today.  Seizure as above, with spontaneous return to baseline.  Likely due to lack of keppra dose this am.  Elba Barman pending at time of transfer to Dr. Lacinda Axon with plan to dc home after keppra load.  I have reviewed all laboratory and imaging studies if ordered as above  1. Seizure   2. UTI (lower urinary tract infection)         Debby Freiberg, MD 06/09/15 [phone]

## 2015-06-06 NOTE — ED Notes (Signed)
Pt from [facility] via EMS. Per EMS, pt received a botox injection, staff called EMS and reported seizure that was approx 3 minutes long 10 minutes post-injection. Pt has hx of seizures and sts that she did not take Keppra this am. Pt adds that she also has hx of HTN. Pt was post-ictal upon EMS arrival but had no incontinence, was A&O and answered all questions appropriately.

## 2015-06-06 NOTE — Patient Instructions (Signed)
PLEASE CALL ME WITH ANY PROBLEMS OR QUESTIONS (#[phone]).  HAVE A GOOD DAY!    

## 2015-06-06 NOTE — Progress Notes (Signed)
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 This includes bleeding, bruising, infection, excessive weakness, or medication side effects. A REMS form is on file and signed.  Needle: 43mm injectable monopolar needle electrode  Number of units per muscle Pectoralis Major 0 units Pectoralis Minor 0 units Biceps 0 units Brachioradialis 100 units (3 access points) FCR 0 units FCU 0 units FDS 75 units FDP 75 units FPL 50 units Pronator Teres 100 units Pronator Quadratus 0 units Quadriceps 0 units Gastroc/soleus 0 units Hamstrings 0 units Tibialis Posterior 0 units EHL 0 units All injections were done after obtaining appropriate EMG activity and after negative drawback for blood. The patient tolerated the procedure well. Post procedure instructions were given. A followup appointment was made.   About 10 minutes after the injections were completed the patient had a complex, tonic-clonic seizure involving her RUE and RLE. Pt as stabilized in her wheelchair. VS were assessed. Her airway was maintained until episode stopped. Pt is now post-ictal and beginning to respond to verbal stim in her wheelchair.    Caregiver stated that she was due her keppra, and the patient was about to take it prior to the event. EMS was contacted for transport to the ED.

## 2015-06-06 NOTE — Discharge Instructions (Signed)
Seizure, Adult A seizure is abnormal electrical activity in the brain. Seizures usually last from 30 seconds to 2 minutes. There are various types of seizures. Before a seizure, you may have a warning sensation (aura) that a seizure is about to occur. An aura may include the following symptoms:   Fear or anxiety.  Nausea.  Feeling like the room is spinning (vertigo).  Vision changes, such as seeing flashing lights or spots. Common symptoms during a seizure include:  A change in attention or behavior (altered mental status).  Convulsions with rhythmic jerking movements.  Drooling.  Rapid eye movements.  Grunting.  Loss of bladder and bowel control.  Bitter taste in the mouth.  Tongue biting. After a seizure, you may feel confused and sleepy. You may also have an injury resulting from convulsions during the seizure. HOME CARE INSTRUCTIONS   If you are given medicines, take them exactly as prescribed by your health care provider.  Keep all follow-up appointments as directed by your health care provider.  Do not swim or drive or engage in risky activity during which a seizure could cause further injury to you or others until your health care provider says it is OK.  Get adequate rest.  Teach friends and family what to do if you have a seizure. They should:  Lay you on the ground to prevent a fall.  Put a cushion under your head.  Loosen any tight clothing around your neck.  Turn you on your side. If vomiting occurs, this helps keep your airway clear.  Stay with you until you recover.  Know whether or not you need emergency care. SEEK IMMEDIATE MEDICAL CARE IF:  The seizure lasts longer than 5 minutes.  The seizure is severe or you do not wake up immediately after the seizure.  You have an altered mental status after the seizure.  You are having more frequent or worsening seizures. Someone should drive you to the emergency department or call local emergency  services (911 in [place] MAKE SURE YOU:  Understand these instructions.  Will watch your condition.  Will get help right away if you are not doing well or get worse. Document Released: 11/26/2000 Document Revised: 09/19/2013 Document Reviewed: 07/11/2013 [facility] Patient Information 2015 [place], [place]. This information is not intended to replace advice given to you by your health care provider. Make sure you discuss any questions you have with your health care provider.   You have a urinary tract infection. Increase fluids. Antibiotic prescribed for 1 week.

## 2015-06-06 NOTE — ED Notes (Signed)
Dr. Gentry at bedside. 

## 2015-06-06 NOTE — ED Notes (Signed)
Patient transported to CT 

## 2015-06-06 NOTE — ED Notes (Signed)
Bed: WA18 Expected date:  Expected time:  Means of arrival:  Comments: ems 

## 2015-06-06 NOTE — ED Provider Notes (Signed)
Patient rechecked at 1730:  No seizure activity. Urinalysis shows evidence of infection. Will start Keflex for same  Donnetta Hutching, MD 06/06/15 [phone]

## 2015-06-09 ENCOUNTER — Telehealth: Payer: Self-pay | Admitting: *Deleted

## 2015-06-09 ENCOUNTER — Other Ambulatory Visit: Payer: Self-pay | Admitting: *Deleted

## 2015-06-09 DIAGNOSIS — I63319 Cerebral infarction due to thrombosis of unspecified middle cerebral artery: Secondary | ICD-10-CM

## 2015-06-09 DIAGNOSIS — E1142 Type 2 diabetes mellitus with diabetic polyneuropathy: Secondary | ICD-10-CM

## 2015-06-09 DIAGNOSIS — G8114 Spastic hemiplegia affecting left nondominant side: Secondary | ICD-10-CM

## 2015-06-09 MED ORDER — OXYCODONE-ACETAMINOPHEN 10-325 MG PO TABS: 1.0000 | ORAL_TABLET | Freq: Three times a day (TID) | ORAL | Status: DC | PRN

## 2015-06-09 NOTE — Telephone Encounter (Addendum)
[name] called back and knows her Rx is ready.

## 2015-06-09 NOTE — Telephone Encounter (Signed)
[name] called and said that she needs her medication refills---she had the seizure in the office and did not get her refill.  Rx printed for Dr Riley Kill to sign.  Will notify when ready to pick up.

## 2015-06-10 LAB — URINE CULTURE: Culture: >=100000

## 2015-06-10 NOTE — Telephone Encounter (Signed)
Printed out RX, called patient and put it in the book for pick up

## 2015-06-11 ENCOUNTER — Telehealth (HOSPITAL_COMMUNITY): Payer: Self-pay

## 2015-06-11 ENCOUNTER — Encounter: Payer: Self-pay | Admitting: Internal Medicine

## 2015-06-11 ENCOUNTER — Ambulatory Visit (INDEPENDENT_AMBULATORY_CARE_PROVIDER_SITE_OTHER): Payer: Medicaid Other | Admitting: Internal Medicine

## 2015-06-11 ENCOUNTER — Ambulatory Visit (HOSPITAL_COMMUNITY)
Admission: RE | Admit: 2015-06-11 | Discharge: 2015-06-11 | Disposition: A | Discharge status: Home / Self Care | Payer: Medicaid Other | Source: Ambulatory Visit | Attending: Internal Medicine | Admitting: Internal Medicine

## 2015-06-11 VITALS — BP 161/102 | HR 96 | Temp 98.3°F | Ht 69.0 in

## 2015-06-11 DIAGNOSIS — A599 Trichomoniasis, unspecified: Secondary | ICD-10-CM | POA: Diagnosis not present

## 2015-06-11 DIAGNOSIS — I10 Essential (primary) hypertension: Secondary | ICD-10-CM | POA: Diagnosis not present

## 2015-06-11 DIAGNOSIS — B372 Candidiasis of skin and nail: Secondary | ICD-10-CM | POA: Diagnosis not present

## 2015-06-11 DIAGNOSIS — S90852A Superficial foreign body, left foot, initial encounter: Secondary | ICD-10-CM | POA: Diagnosis not present

## 2015-06-11 DIAGNOSIS — L97529 Non-pressure chronic ulcer of other part of left foot with unspecified severity: Secondary | ICD-10-CM

## 2015-06-11 DIAGNOSIS — R21 Rash and other nonspecific skin eruption: Secondary | ICD-10-CM | POA: Diagnosis not present

## 2015-06-11 DIAGNOSIS — J069 Acute upper respiratory infection, unspecified: Secondary | ICD-10-CM | POA: Diagnosis not present

## 2015-06-11 DIAGNOSIS — R350 Frequency of micturition: Secondary | ICD-10-CM | POA: Diagnosis present

## 2015-06-11 DIAGNOSIS — G8110 Spastic hemiplegia affecting unspecified side: Secondary | ICD-10-CM | POA: Diagnosis not present

## 2015-06-11 DIAGNOSIS — X58XXXA Exposure to other specified factors, initial encounter: Secondary | ICD-10-CM

## 2015-06-11 DIAGNOSIS — E1165 Type 2 diabetes mellitus with hyperglycemia: Secondary | ICD-10-CM | POA: Diagnosis not present

## 2015-06-11 DIAGNOSIS — E1140 Type 2 diabetes mellitus with diabetic neuropathy, unspecified: Secondary | ICD-10-CM | POA: Diagnosis not present

## 2015-06-11 NOTE — Progress Notes (Signed)
Subjective:   Patient ID: [name] female   DOB: September 26, 1966 49 y.o.   MRN: [id number]  HPI: [name] is a 49 y.o. female w/ PMHx of HTN, HLD, uncontrolled DM type II, OSA, CAD, CHF, and h/o CVA w/ left-sided residual weakness, and seizures, presents to the clinic today for an acute visit regarding a small left great toe ulcer. Patient states it has been present for about 3-4 weeks and has become increasingly tender to the touch. Despite patient having LLE weakness 2/2 previous CVA, she still has what seems like normal sensation in her feet. She claims the wound has made it difficult to wear shoes. She says it started as a very small callus and developed into a slightly larger ulcer. She denies significant redness or discharge from the wound. No fever, chills, nausea, or vomiting. No pain in the leg. CBG's have been well controlled lately per the patient.   Current Outpatient Prescriptions  Medication Sig Dispense Refill  . ACCU-CHEK FASTCLIX LANCETS MISC Use 1-2 times daily when checking blood sugar. ICD10: E11.9 102 each 11  . amLODipine (NORVASC) 5 MG tablet Take 1 tablet (5 mg total) by mouth daily. 30 tablet 6  . aspirin 325 MG tablet Take 1 tablet (325 mg total) by mouth daily. 30 tablet 5  . Blood Glucose Monitoring Suppl (ACCU-CHEK AVIVA PLUS) W/DEVICE KIT 1 each by Does not apply route 3 (three) times daily. 1 kit 0  . cephALEXin (KEFLEX) 500 MG capsule Take 1 capsule (500 mg total) by mouth 4 (four) times daily. 28 capsule 0  . citalopram (CELEXA) 20 MG tablet Take 1 tablet (20 mg total) by mouth daily. 30 tablet 5  . cyclobenzaprine (FLEXERIL) 10 MG tablet Take 1 tablet (10 mg total) by mouth 2 (two) times daily as needed for muscle spasms. 30 tablet 1  . diclofenac sodium (VOLTAREN) 1 % GEL Apply 2 g topically 4 (four) times daily as needed (pain). 3 Tube 5  . digoxin (LANOXIN) 0.125 MG tablet Take 1 tablet (0.125 mg total) by mouth daily. 30 tablet 5  . fluticasone  (CUTIVATE) 0.05 % cream Apply topically 2 (two) times daily. Dispense two tubes 30 g 6  . furosemide (LASIX) 40 MG tablet Take 1 tablet (40 mg total) by mouth daily. (Patient taking differently: Take 40 mg by mouth daily as needed for fluid or edema. ) 90 tablet 3  . glucose blood (ACCU-CHEK SMARTVIEW) test strip Use to Check Blood Sugar TID. Code: E11.40. 100 each 12  . hydrALAZINE (APRESOLINE) 50 MG tablet Take 1.5 tablets (75 mg total) by mouth 3 (three) times daily. 270 tablet 3  . ibuprofen (ADVIL,MOTRIN) 200 MG tablet Take 200 mg by mouth every 6 (six) hours as needed for moderate pain.    . Incontinence Supplies MISC 1 year supply of Pull-ups in size XL Diagnosis Code: 788.33 1 each 0  . Insulin Glargine (LANTUS SOLOSTAR) 100 UNIT/ML Solostar Pen Inject 10 units in the AM, 35 units in the PM 15 mL 11  . Insulin Pen Needle (GLOBAL EASE INJECT PEN NEEDLES) 31G X 5 MM MISC Use to inject insulin BID. Code: E 11.40 100 each 10  . ipratropium (ATROVENT) 0.06 % nasal spray Place 2 sprays into both nostrils 4 (four) times daily. (Patient taking differently: Place 2 sprays into both nostrils daily as needed for rhinitis. ) 15 mL 1  . levETIRAcetam (KEPPRA) 500 MG tablet Take 1 tablet (500 mg total) by mouth  2 (two) times daily. 60 tablet 3  . metFORMIN (GLUCOPHAGE) 1000 MG tablet Take 1 tablet (1,000 mg total) by mouth daily with breakfast. 90 tablet 3  . metoprolol succinate (TOPROL-XL) 25 MG 24 hr tablet Take $RemoveBef'75mg'cMCHcUMCUU$  (3 tablets) twice daily. 180 tablet 1  . nystatin cream (MYCOSTATIN) APPLY ONE APPLICATION TOPICALLY TWO TIMES DAILY 30 g 0  . oxyCODONE-acetaminophen (PERCOCET) 10-325 MG per tablet Take 1 tablet by mouth every 8 (eight) hours as needed for pain. 90 tablet 0  . pantoprazole (PROTONIX) 40 MG tablet Take 40 mg by mouth daily.    . potassium chloride 20 MEQ TBCR Take 20 mEq by mouth daily. 90 tablet 3  . simvastatin (ZOCOR) 40 MG tablet Take 40 mg by mouth at bedtime.     . Vitamin D,  Ergocalciferol, (DRISDOL) 50000 UNITS CAPS capsule Take 1 capsule (50,000 Units total) by mouth every 7 (seven) days. (Patient taking differently: Take 50,000 Units by mouth every Monday. ) 24 capsule 0   No current facility-administered medications for this visit.   Review of Systems  General: Denies fever, diaphoresis, appetite change, and fatigue.  Respiratory: Denies SOB, cough, and wheezing.   Cardiovascular: Denies chest pain and palpitations.  Gastrointestinal: Denies nausea, vomiting, abdominal pain, and diarrhea Musculoskeletal: Positive for for left foot pain. Denies myalgias, back pain, and gait problem.  Neurological: Denies dizziness, syncope, weakness, lightheadedness, and headaches.  Psychiatric/Behavioral: Denies mood changes, sleep disturbance, and agitation.    Objective:   Physical Exam: Filed Vitals:   06/11/15 1416  BP: 161/102  Pulse: 96  Temp: 98.3 F (36.8 C)  TempSrc: Oral  Height: $Remove'5\' 9"'nUpHdnN$  (1.753 m)  SpO2: 100%     General: Alert, cooperative, NAD. HEENT: PERRL, EOMI. Moist mucus membranes. Left-sided facial droop.  Neck: Full range of motion without pain, supple, no lymphadenopathy or carotid bruits Lungs: Clear to ascultation bilaterally, normal work of respiration, no wheezes, rales, rhonchi Heart: RRR, no murmurs, gallops, or rubs Abdomen: Soft, non-tender, non-distended, BS + Extremities: No cyanosis, clubbing, or edema. Left upper and lower extremity weakness. Left great toe/metatarsal wound, 1.0 x 1.0 cm in size, small ulceration in the center, no discharge. Very mild surrounding erythema, tender to the touch. No fluctuance or induration. Neurologic: Alert & oriented X3, cranial nerves II-XII intact, 1/5 strength in LUE, 3/5 in LLE. Otherwise strength intact.   Assessment & Plan:   Please see problem based assessment and plan.

## 2015-06-11 NOTE — Patient Instructions (Signed)
1. Please schedule a follow up for 6 weeks.   2. Please take all medications as previously prescribed.   We will call you regarding an appointment in the wound care center.   Please go upstairs for a foot X-ray.  3. If you have worsening of your symptoms or new symptoms arise, please call the clinic ([phone]), or go to the ER immediately if symptoms are severe.  You have done a great job in taking all your medications. Please continue to do this.

## 2015-06-11 NOTE — Telephone Encounter (Signed)
Post ED Visit - Positive Culture Follow-up  Culture report reviewed by antimicrobial stewardship pharmacist: []  Wes Dulaney, Pharm.D., BCPS [x]  , Pharm.D., BCPS []  , Pharm.D., BCPS []  [place], .D., BCPS, AAHIVP []  Georgina Pillion, Pharm.D., BCPS, AAHIVP []  , [place].D., BCPS  Positive urine culture Treated with cephalexin, organism sensitive to the same and no further patient follow-up is required at this time.  [address] 06/11/2015, 1:51 PM

## 2015-06-12 DIAGNOSIS — A599 Trichomoniasis, unspecified: Secondary | ICD-10-CM | POA: Insufficient documentation

## 2015-06-12 DIAGNOSIS — S90852A Superficial foreign body, left foot, initial encounter: Secondary | ICD-10-CM | POA: Insufficient documentation

## 2015-06-12 LAB — PREALBUMIN: PREALBUMIN: 17 mg/dL (ref 17–34)

## 2015-06-12 NOTE — Assessment & Plan Note (Signed)
Identified on recent UA while patient was seen in the ED. Does not appear that this was treated as it was not addressed during encounter, did not disucc w/ patient during clinic visit as was not identified until after patient returned home.  -Will discuss with patient, give 2000 mg Flagyl once

## 2015-06-12 NOTE — Assessment & Plan Note (Signed)
Patient w/ new ulceration on left great toe, states it has been present for 3-4 weeks. No discharge, tender to the touch. 1 x 1 cm w/ small ulceration in the center. Surrounding erythema is minimal, no obvious induration or fluctuance. XR of the foot not suggestive of osteomyelitis. Patient w/ no systemic symptoms. Wound appears dry. XR also identified sewing needle embedded within the central region of the foot, timing of this unknown.  -Referral for wound care placed for ulcer -Patient on Keflex for UTI, will likely cover many of common organisms -Consult also placed for ortho for possible removal of sewing needle -Instructed patient to continue good CBG control, wear loose fitting shoes, and return if wound worsens or grows in size.

## 2015-06-12 NOTE — Assessment & Plan Note (Signed)
XR performed of left foot for evaluation of possible osteomyelitis of great toe (not seen), however, sewing needle found embedded in the arch of the left foot. Needle fractured through the center, unclear how long this has been present. Needs removal given risk of infection. No obvious entrance wound, not identified on exam.  -Referral to ortho -Will discuss further with patient

## 2015-06-13 ENCOUNTER — Ambulatory Visit (INDEPENDENT_AMBULATORY_CARE_PROVIDER_SITE_OTHER): Payer: Medicaid Other | Admitting: Neurology

## 2015-06-13 ENCOUNTER — Other Ambulatory Visit: Payer: Self-pay

## 2015-06-13 ENCOUNTER — Emergency Department (HOSPITAL_COMMUNITY): Payer: Medicaid Other

## 2015-06-13 ENCOUNTER — Other Ambulatory Visit: Payer: Self-pay | Admitting: Internal Medicine

## 2015-06-13 ENCOUNTER — Encounter (HOSPITAL_COMMUNITY): Payer: Self-pay | Admitting: Family Medicine

## 2015-06-13 ENCOUNTER — Inpatient Hospital Stay (HOSPITAL_COMMUNITY)
Admission: EM | Admit: 2015-06-13 | Discharge: 2015-06-13 | DRG: 292 | Disposition: A | Discharge status: Home / Self Care | Payer: Medicaid Other | Attending: Emergency Medicine | Admitting: Emergency Medicine

## 2015-06-13 ENCOUNTER — Encounter: Payer: Self-pay | Admitting: Neurology

## 2015-06-13 ENCOUNTER — Telehealth: Payer: Self-pay | Admitting: Physician Assistant

## 2015-06-13 VITALS — BP 180/110 | HR 102 | Resp 22 | Ht 69.0 in | Wt 272.9 lb

## 2015-06-13 DIAGNOSIS — R0789 Other chest pain: Secondary | ICD-10-CM

## 2015-06-13 DIAGNOSIS — I69854 Hemiplegia and hemiparesis following other cerebrovascular disease affecting left non-dominant side: Secondary | ICD-10-CM | POA: Diagnosis not present

## 2015-06-13 DIAGNOSIS — IMO0002 Reserved for concepts with insufficient information to code with codable children: Secondary | ICD-10-CM

## 2015-06-13 DIAGNOSIS — Y95 Nosocomial condition: Secondary | ICD-10-CM

## 2015-06-13 DIAGNOSIS — I5043 Acute on chronic combined systolic (congestive) and diastolic (congestive) heart failure: Secondary | ICD-10-CM | POA: Diagnosis not present

## 2015-06-13 DIAGNOSIS — I471 Supraventricular tachycardia: Secondary | ICD-10-CM | POA: Diagnosis not present

## 2015-06-13 DIAGNOSIS — A599 Trichomoniasis, unspecified: Secondary | ICD-10-CM

## 2015-06-13 DIAGNOSIS — G4733 Obstructive sleep apnea (adult) (pediatric): Secondary | ICD-10-CM | POA: Diagnosis present

## 2015-06-13 DIAGNOSIS — I5022 Chronic systolic (congestive) heart failure: Secondary | ICD-10-CM

## 2015-06-13 DIAGNOSIS — G40209 Localization-related (focal) (partial) symptomatic epilepsy and epileptic syndromes with complex partial seizures, not intractable, without status epilepticus: Secondary | ICD-10-CM | POA: Diagnosis not present

## 2015-06-13 DIAGNOSIS — I447 Left bundle-branch block, unspecified: Secondary | ICD-10-CM | POA: Diagnosis present

## 2015-06-13 DIAGNOSIS — I69354 Hemiplegia and hemiparesis following cerebral infarction affecting left non-dominant side: Secondary | ICD-10-CM

## 2015-06-13 DIAGNOSIS — Z96611 Presence of right artificial shoulder joint: Secondary | ICD-10-CM | POA: Diagnosis present

## 2015-06-13 DIAGNOSIS — E1140 Type 2 diabetes mellitus with diabetic neuropathy, unspecified: Secondary | ICD-10-CM

## 2015-06-13 DIAGNOSIS — Z79899 Other long term (current) drug therapy: Secondary | ICD-10-CM

## 2015-06-13 DIAGNOSIS — I429 Cardiomyopathy, unspecified: Secondary | ICD-10-CM | POA: Diagnosis present

## 2015-06-13 DIAGNOSIS — I5032 Chronic diastolic (congestive) heart failure: Secondary | ICD-10-CM

## 2015-06-13 DIAGNOSIS — I10 Essential (primary) hypertension: Secondary | ICD-10-CM | POA: Diagnosis present

## 2015-06-13 DIAGNOSIS — R079 Chest pain, unspecified: Secondary | ICD-10-CM

## 2015-06-13 DIAGNOSIS — Z7982 Long term (current) use of aspirin: Secondary | ICD-10-CM | POA: Diagnosis not present

## 2015-06-13 DIAGNOSIS — E785 Hyperlipidemia, unspecified: Secondary | ICD-10-CM

## 2015-06-13 DIAGNOSIS — E1142 Type 2 diabetes mellitus with diabetic polyneuropathy: Secondary | ICD-10-CM | POA: Diagnosis present

## 2015-06-13 DIAGNOSIS — Z794 Long term (current) use of insulin: Secondary | ICD-10-CM | POA: Diagnosis not present

## 2015-06-13 DIAGNOSIS — Z95810 Presence of automatic (implantable) cardiac defibrillator: Secondary | ICD-10-CM

## 2015-06-13 DIAGNOSIS — Z792 Long term (current) use of antibiotics: Secondary | ICD-10-CM

## 2015-06-13 DIAGNOSIS — R0602 Shortness of breath: Secondary | ICD-10-CM | POA: Diagnosis present

## 2015-06-13 DIAGNOSIS — K219 Gastro-esophageal reflux disease without esophagitis: Secondary | ICD-10-CM | POA: Diagnosis present

## 2015-06-13 DIAGNOSIS — E1165 Type 2 diabetes mellitus with hyperglycemia: Secondary | ICD-10-CM

## 2015-06-13 DIAGNOSIS — J189 Pneumonia, unspecified organism: Secondary | ICD-10-CM | POA: Diagnosis present

## 2015-06-13 LAB — CBC
HCT: 37.1 % (ref 36.0–46.0)
Hemoglobin: 12 g/dL (ref 12.0–15.0)
MCH: 26.6 pg (ref 26.0–34.0)
MCHC: 32.3 g/dL (ref 30.0–36.0)
MCV: 82.3 fL (ref 78.0–100.0)
PLATELETS: 253 10*3/uL (ref 150–400)
RBC: 4.51 MIL/uL (ref 3.87–5.11)
RDW: 13.4 % (ref 11.5–15.5)
WBC: 9.9 10*3/uL (ref 4.0–10.5)

## 2015-06-13 LAB — BASIC METABOLIC PANEL
ANION GAP: 11 (ref 5–15)
BUN: 6 mg/dL (ref 6–20)
CALCIUM: 8.9 mg/dL (ref 8.9–10.3)
CO2: 23 mmol/L (ref 22–32)
Chloride: 102 mmol/L (ref 101–111)
Creatinine, Ser: 0.78 mg/dL (ref 0.44–1.00)
GFR calc non Af Amer: >60 mL/min (ref >60)
Glucose, Bld: 234 mg/dL — ABNORMAL HIGH (ref 65–99)
POTASSIUM: 3.5 mmol/L (ref 3.5–5.1)
SODIUM: 136 mmol/L (ref 135–145)

## 2015-06-13 LAB — BRAIN NATRIURETIC PEPTIDE: B NATRIURETIC PEPTIDE 5: 66.1 pg/mL (ref 0.0–100.0)

## 2015-06-13 LAB — I-STAT TROPONIN, ED: TROPONIN I, POC: 0 ng/mL (ref 0.00–0.08)

## 2015-06-13 MED ORDER — SODIUM CHLORIDE 0.9 % IV SOLN: INTRAVENOUS | Status: DC

## 2015-06-13 MED ORDER — METOPROLOL SUCCINATE ER 100 MG PO TB24: 100.0000 mg | ORAL_TABLET | Freq: Two times a day (BID) | ORAL | Status: DC

## 2015-06-13 MED ORDER — METRONIDAZOLE 500 MG PO TABS: 2000.0000 mg | ORAL_TABLET | Freq: Once | ORAL | Status: AC

## 2015-06-13 MED ORDER — ONDANSETRON HCL 4 MG/2ML IJ SOLN: 4.0000 mg | Freq: Three times a day (TID) | INTRAMUSCULAR | Status: DC | PRN

## 2015-06-13 MED ORDER — HYDROMORPHONE HCL 1 MG/ML IJ SOLN: 1.0000 mg | INTRAMUSCULAR | Status: DC | PRN

## 2015-06-13 NOTE — Consult Note (Signed)
Patient ID: [name] MRN: [id number], DOB/AGE: [birth year]   Admit date: 06/13/2015   Primary Physician: Luanne Bras, MD Primary Cardiologist: Dr. Aundra Dubin Primary Electrophysiologist: Dr. Caryl Comes  Pt. Profile:  morbidly obese 49 year old AA female with past medical history of HTN, HLD, DM2, OSA on CPAP, combined CHF in the setting of uncontrolled HTN, CVA with L sided paralysis, chronic LBBB and NICM s/p Medtronic CRT-D present with sudden onset of SOB and sharp substernal CP  Problem List  Past Medical History  Diagnosis Date  . Hypertension     Poorly controlled. Has had HTN since age 43. Angioedema with ACEI.  24 Hr urine and renal arterial dopplers ordered . . . Never done  . NICM (nonischemic cardiomyopathy)     EF 45-50% in 8/12, cath 6/12 showed normal coronaries, EF 50-55% by LV gram  . Morbid obesity   . Allergic rhinitis   . HLD (hyperlipidemia)   . OSA on CPAP     sleep study in 8/12 showed moderate to severe OSA requiring CPAP  . Chronic lower back pain     secondary to DJD, obsetiy, hip problems. Followed by Dr. Oval Linsey (pain management)  . Chronic diastolic heart failure      Primarily diastolic CHF: Likely due to uncontrolled HTN. Last echo (8/12) with EF 45-50%, mild to moderate LVH with some asymmetric septal hypertrophy, RV normal size and systolic function. EF 50-55% by LV-gram in 6/12.   . Polyneuropathy in diabetes(357.2)   . Thoracic or lumbosacral neuritis or radiculitis, unspecified   . Calcifying tendinitis of shoulder   . LBBB (left bundle branch block)   . Chronic combined systolic and diastolic CHF (congestive heart failure)     EF 40-45% by echo 12/06/2012  . Coronary artery disease     questionable. LHC 05/2011 showing normal coronaries // Followed at [facility], Dr. Aundra Dubin  . GERD (gastroesophageal reflux disease)   . Shortness of breath   . Asthma   . Automatic implantable cardioverter-defibrillator in situ   . Stroke 12/2013     "my left side is paralyzed" (07/04/2014)  . Type II diabetes mellitus DX: 2002  . 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 Degeneration of lumbar or lumbosacral intervertebral disc   . Arthritis     "hips, back, legs, arms" (07/04/2014)  . Frequent UTI   . Liver disease     Past Surgical History  Procedure Laterality Date  . Tubal ligation  05/31/1985  . Carpal tunnel release Left   . Hemiarthroplasty shoulder fracture Right 1980's  . Multiple tooth extractions  ~ 2011    tumors removed ; "my whole top"  . Cardiac catheterization  05/2011  . Tee without cardioversion N/A 01/14/2014    Procedure: TRANSESOPHAGEAL ECHOCARDIOGRAM (TEE);  Surgeon: Sanda Klein, MD;  Location: [facility] ENDOSCOPY;  Service: Cardiovascular;  Laterality: N/A;  . Breast surgery Bilateral 2011    patient reports benign results  . Bi-ventricular implantable cardioverter defibrillator  (crt-d)  08/2013    Archie Endo 08/23/2013  . Bi-ventricular implantable cardioverter defibrillator N/A 08/22/2013    Procedure: BI-VENTRICULAR IMPLANTABLE CARDIOVERTER DEFIBRILLATOR  (CRT-D);  Surgeon: Deboraha Sprang, MD;  Location: [facility] CATH LAB;  Service: Cardiovascular;  Laterality: N/A;     Allergies  Allergies  Allergen Reactions  . Clindamycin Anaphylaxis and Swelling  . Ace Inhibitors     Swelling of the tongue and throat   . Enalapril Maleate  Swelling of the tongue and throat   . Lisinopril     Swelling of the tongue and throat     HPI  The patient is a morbidly obese 49 year old AA female with past medical history of HTN, HLD, DM2, OSA on CPAP, combined CHF in the setting of uncontrolled HTN, CVA with L sided paralysis, chronic LBBB and NICM s/p Medtronic CRT-D. The patient has been followed by both Dr. Caryl Comes and Dr. Aundra Dubin. She was initially admitted in 05/2011 with chest pain and a new left bundle branch block. Cardiac catheterization at the time showed no angiographic evidence of CAD. She was admitted  in January 2015 with stroke. CT of the head showed occlusion of the R MCA territory. Since then, she's had left sided paralysis. In November 2015 she had a seizure which was thought to be related to prior CVA and she was started on Keppra. She was last seen in the heart failure clinic in 04/02/2015, at which time she was doing well. On interrogation of her device with optimal, it shows thoracic impedance increasing and the fluid index less than threshold. On physical exam, she was euvolemic. Her Toprol-XL was increased to 75 mg twice a day.  Since her visit, patient states she has talked with heart failure service and Dr. Aundra Dubin and negative is currently using the Lasix only as needed. She has not taken her Lasix for the past 2 weeks. She denies any recent increase in swelling in the lower extremities. She denies any recent significant paroxysmal nocturnal dyspnea. Her weight has been surprisingly stable recently with baseline weight ranging between 268-272 pounds for the past several weeks. She has been compliant with her medication. According to the patient, her heart rate has been increasing for the past month for unclear reasons however she has not had any significant symptom. She was seen by internal medicine as outpatient on 06/11/2015 for evaluation of small left great toe ulcer. Left foot x-ray was obtained which showed sewing needlelike foreign body present in the mid foot adjacent to the plantar aspect of the third metatarsal.   She was driving her car around 2 PM on 06/13/2015 when she had sudden onset of sharp substernal chest pain and shortness of breath. The symptoms were intermittent and comes in waves. On arrival to [facility], she was noted to have blood pressure of 144/94. Heart rate 130s. EKG showed baseline left bundle branch block with wide complex tachycardia questionable for SVT versus A. fib with RVR. CBC and BMET were normal. Troponin negative. Chest x-ray showed mild central  pulmonary vascular prominence which may reflect low grade compensated heart failure, although there is no pulmonary edema or pneumonia. Physical exam shows 1-2+ pitting edema in bilateral lower extremity, however per patient this is chronic for her.    Home Medications  Prior to Admission medications   Medication Sig Start Date End Date Taking? Authorizing Provider  amLODipine (NORVASC) 5 MG tablet Take 1 tablet (5 mg total) by mouth daily. 12/19/14  Yes Deboraha Sprang, MD  aspirin 325 MG tablet Take 1 tablet (325 mg total) by mouth daily. 01/14/15  Yes Corky Sox, MD  cephALEXin (KEFLEX) 500 MG capsule Take 1 capsule (500 mg total) by mouth 4 (four) times daily. 06/06/15  Yes Nat Christen, MD  cyclobenzaprine (FLEXERIL) 10 MG tablet Take 1 tablet (10 mg total) by mouth 2 (two) times daily as needed for muscle spasms. 04/27/15  Yes Noemi Chapel, MD  diclofenac sodium (VOLTAREN) 1 %  GEL Apply 2 g topically 4 (four) times daily as needed (pain). 02/13/15  Yes Meredith Staggers, MD  digoxin (LANOXIN) 0.125 MG tablet Take 1 tablet (0.125 mg total) by mouth daily. 02/25/15  Yes Corky Sox, MD  furosemide (LASIX) 40 MG tablet Take 1 tablet (40 mg total) by mouth daily. Patient taking differently: Take 40 mg by mouth daily as needed for fluid.  02/17/15  Yes Larey Dresser, MD  hydrALAZINE (APRESOLINE) 50 MG tablet Take 1.5 tablets (75 mg total) by mouth 3 (three) times daily. 02/17/15  Yes Larey Dresser, MD  ibuprofen (ADVIL,MOTRIN) 200 MG tablet Take 200 mg by mouth every 6 (six) hours as needed for moderate pain.   Yes Historical Provider, MD  Insulin Glargine (LANTUS SOLOSTAR) 100 UNIT/ML Solostar Pen Inject 10 units in the AM, 35 units in the PM Patient taking differently: Inject 10-35 Units into the skin See admin instructions. Inject 10 units in the AM, 35 units in the PM 10/01/14  Yes Corky Sox, MD  levETIRAcetam (KEPPRA) 500 MG tablet Take 1 tablet (500 mg total) by mouth 2 (two) times daily. 03/31/15   Yes Aldine Contes, MD  metFORMIN (GLUCOPHAGE) 1000 MG tablet Take 1 tablet (1,000 mg total) by mouth daily with breakfast. 04/15/15  Yes Corky Sox, MD  metoprolol succinate (TOPROL-XL) 25 MG 24 hr tablet Take $RemoveBef'75mg'ElKWpQePUa$  (3 tablets) twice daily. 04/02/15  Yes Jolaine Artist, MD  oxyCODONE-acetaminophen (PERCOCET) 10-325 MG per tablet Take 1 tablet by mouth every 8 (eight) hours as needed for pain. 06/09/15  Yes Bayard Hugger, NP  pantoprazole (PROTONIX) 40 MG tablet Take 40 mg by mouth daily.   Yes Historical Provider, MD  potassium chloride 20 MEQ TBCR Take 20 mEq by mouth daily. 08/07/14  Yes Rande Brunt, NP  simvastatin (ZOCOR) 40 MG tablet Take 40 mg by mouth at bedtime.    Yes Historical Provider, MD  Vitamin D, Ergocalciferol, (DRISDOL) 50000 UNITS CAPS capsule Take 1 capsule (50,000 Units total) by mouth every 7 (seven) days. Patient taking differently: Take 50,000 Units by mouth every Monday.  02/19/15  Yes Annia Belt, MD  ACCU-CHEK FASTCLIX LANCETS MISC Use 1-2 times daily when checking blood sugar. ICD10: E11.9 05/08/15   Corky Sox, MD  Blood Glucose Monitoring Suppl (ACCU-CHEK AVIVA PLUS) W/DEVICE KIT 1 each by Does not apply route 3 (three) times daily. 01/14/15   Corky Sox, MD  citalopram (CELEXA) 20 MG tablet Take 1 tablet (20 mg total) by mouth daily. Patient not taking: Reported on 06/13/2015 04/14/15 04/11/16  Corky Sox, MD  fluticasone (CUTIVATE) 0.05 % cream Apply topically 2 (two) times daily. Dispense two tubes Patient not taking: Reported on 06/13/2015 02/25/15   Corky Sox, MD  glucose blood (ACCU-CHEK SMARTVIEW) test strip Use to Check Blood Sugar TID. Code: E11.40. 09/30/14   Corky Sox, MD  Incontinence Supplies MISC 1 year supply of Pull-ups in size XL Diagnosis Code: 788.33 08/27/14   Corky Sox, MD  Insulin Pen Needle (GLOBAL EASE INJECT PEN NEEDLES) 31G X 5 MM MISC Use to inject insulin BID. Code: E 11.40 09/30/14   Corky Sox, MD  ipratropium  (ATROVENT) 0.06 % nasal spray Place 2 sprays into both nostrils 4 (four) times daily. Patient not taking: Reported on 06/13/2015 01/07/15   Billy Fischer, MD  metroNIDAZOLE (FLAGYL) 500 MG tablet Take 4 tablets (2,000 mg total) by mouth once. Patient not taking:  Reported on 06/13/2015 06/13/15 06/20/15  Corky Sox, MD  nystatin cream (MYCOSTATIN) APPLY ONE APPLICATION TOPICALLY TWO TIMES DAILY Patient not taking: Reported on 06/13/2015 02/24/15   Corky Sox, MD    Family History  Family History  Problem Relation Age of Onset  . Heart disease Mother 34    Died of MI at age 20 yo  . Heart disease Paternal Grandmother     requiring pacemaker.  [name] Heart disease Paternal Grandfather 4    Died of MI at possibly age 53-53yo  . Stroke Paternal Grandfather   . Heart disease Father 66    MI age 42yo requiring stenting  . Kidney disease Mother     requiring dialysis  . Congestive Heart Failure Mother   . Diabetes Father   . Diabetes Brother   . Heart disease Brother 69    MI at age 76 years old  . Breast cancer Paternal Aunt   . Breast cancer Maternal Grandmother   . Glaucoma Father     Social History  History   Social History  . Marital Status: Single    Spouse Name: N/A  . Number of Children: 2  . Years of Education: 9th grade   Occupational History  . Unemployed     planning on getting disability   Social History Main Topics  . Smoking status: Never Smoker   . Smokeless tobacco: Never Used  . Alcohol Use: No  . Drug Use: No  . Sexual Activity:    Partners: Male    Birth Control/ Protection: Surgical   Other Topics Concern  . Not on file   Social History Narrative   Lives in [place] with her son. Is able to read and write fluently in [place].     Review of Systems General:  No chills, fever, night sweats or weight changes.  Cardiovascular:  No dyspnea on exertion, edema, orthopnea, palpitations, paroxysmal nocturnal dyspnea. +chest pain Dermatological: No rash,  lesions/masses Respiratory: No cough + dyspnea Urologic: No hematuria, dysuria Abdominal:   No nausea, vomiting, diarrhea, bright red blood per rectum, melena, or hematemesis Neurologic:  No visual changes, wkns, changes in mental status. All other systems reviewed and are otherwise negative except as noted above.  Physical Exam  Blood pressure 123/86, pulse 134, temperature 97.9 F (36.6 C), resp. rate 11, weight 272 lb (123.378 kg), last menstrual period 05/17/2015, SpO2 97 %.  General: Pleasant, NAD Psych: Normal affect. Neuro: Alert and oriented X 3. Moves all extremities spontaneously. HEENT: Normal  Neck: Supple without bruits or JVD. Lungs:  Resp regular and unlabored, CTA. Heart: Tachycardic no s3, s4, or murmurs. Abdomen: Soft, non-tender, non-distended, BS + x 4.  Extremities: No clubbing, cyanosis. DP/PT/Radials 2+ and equal bilaterally. 1-2+ pitting edema  Labs  Troponin (Point of Care Test)  Recent Labs  06/13/15 1522  TROPIPOC 0.00   No results for input(s): CKTOTAL, CKMB, TROPONINI in the last 72 hours. Lab Results  Component Value Date   WBC 9.9 06/13/2015   HGB 12.0 06/13/2015   HCT 37.1 06/13/2015   MCV 82.3 06/13/2015   PLT 253 06/13/2015     Recent Labs Lab 06/13/15 1453  NA 136  K 3.5  CL 102  CO2 23  BUN 6  CREATININE 0.78  CALCIUM 8.9  GLUCOSE 234*   Lab Results  Component Value Date   CHOL 174 01/14/2015   HDL 43 01/14/2015   LDLCALC 106* 01/14/2015   TRIG 124 01/14/2015  Lab Results  Component Value Date   DDIMER 1.49* 09/24/2013     Radiology/Studies  Dg Chest 2 View  06/13/2015   CLINICAL DATA:  Mid chest pain and shortness of breath today ; history of CVA, hypertension, diabetes, and pacemaker defibrillator.  EXAM: CHEST  2 VIEW  COMPARISON:  PA and lateral chest of March 05, 2015  FINDINGS: The lungs are adequately inflated. The central pulmonary vascularity is mildly prominent. There is no significant cephalization of  the vascular pattern. There is no interstitial or alveolar edema. There is no pneumonia. The heart is top-normal in size but stable. The permanent pacemaker defibrillator is unchanged in position. The bony thorax exhibits no acute abnormality.  IMPRESSION: Mild central pulmonary vascular prominence may reflect low-grade compensated CHF. There is no pulmonary edema or pneumonia.   Electronically Signed   By: David  Martinique M.D.   On: 06/13/2015 15:03   Ct Head Wo Contrast  06/06/2015   CLINICAL DATA:  Receive Botox injection. Called EMS and reported seizures 3 minutes lung 10 minutes after injection. Hat patient has history of seizures and did not take medication this morning.  EXAM: CT HEAD WITHOUT CONTRAST  TECHNIQUE: Contiguous axial images were obtained from the base of the skull through the vertex without intravenous contrast.  COMPARISON:  02/08/2015  FINDINGS: Examination demonstrates the ventricles and cisterns to be within normal. There is evidence of patient's old right MCA territory infarct. There is minimal chronic ischemic microvascular disease. There is no mass, mass effect, shift of midline structures or acute hemorrhage. No evidence of acute infarction. Remaining bones and soft tissues are within normal.  IMPRESSION: No acute intracranial findings.  Old right MCA territory infarct. Chronic ischemic microvascular disease.   Electronically Signed   By: Marin Olp M.D.   On: 06/06/2015 15:05   Dg Knee Complete 4 Views Left  05/27/2015   CLINICAL DATA:  Leg pain. Inability to use the LEFT leg with onset of symptoms this morning.  EXAM: LEFT KNEE - COMPLETE 4+ VIEW  COMPARISON:  None.  FINDINGS: Mild to moderate tricompartmental osteoarthritis is present with mild medial joint space narrowing and marginal osteophytes. No effusion or fracture. The alignment of the knee is anatomic. No destructive osseous lesions. Mineralization appears normal. Artifact from clothing projects over the oblique views.   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 Initial encounter.   Electronically Signed   By: Dereck Ligas M.D.   On: 05/27/2015 17:49   Dg Foot Complete Left  06/11/2015   CLINICAL DATA:  Follow-up of an ulcer on the great toe of the left foot  EXAM: LEFT FOOT - COMPLETE 3+ VIEW  COMPARISON:  None.  FINDINGS: The bones are mildly osteopenic. The bones of the great toe exhibit no lytic or blastic foci. The interphalangeal and metatarsophalangeal joints are reasonably well-maintained. No soft tissue gas collections or foreign bodies are demonstrated in the great toe.  There is are broken needle like foreign body that projects over the plantar aspect of the foot at the level of the third metatarsal shaft. It does not appear to enter the bone. No acute bony abnormalities are observed. There are plantar and Achilles region calcaneal spurs. There is mild soft tissue swelling over the midfoot and forefoot.  IMPRESSION: 1. There is no radiographic evidence of osteomyelitis of the great toe. No soft tissue gas collections or foreign bodies in the great toe are observed. 2. There is a sewing needle-like foreign body present in the  midfoot adjacent to the plantar aspect of the third metatarsal.   Electronically Signed   By: David  Martinique M.D.   On: 06/11/2015 15:21   Dg Hip Unilat With Pelvis 2-3 Views Left  05/27/2015   CLINICAL DATA:  Initial encounter. Patient was unable use the LEFT leg after waking this morning.  EXAM: LEFT HIP (WITH PELVIS) 2-3 VIEWS  COMPARISON:  None.  FINDINGS: Mild LEFT hip osteoarthritis is present with small marginal osteophytes noted on the lateral projection. There is no fracture. Both hips appear located. The obturator rings are intact. Phleboliths noted in the anatomic pelvis. Panniculus projects over the hips on the frontal projection.  IMPRESSION: Negative.   Electronically Signed   By: Dereck Ligas M.D.   On: 05/27/2015 17:51     ECG  Wide-complex tachycardia with baseline left bundle branch block, questionable SVT versus A. fib with RVR.  Echocardiogram 04/02/2015  LV EF: 40% -  45%  ------------------------------------------------------------------- Indications:   CHF - 428.0.  ------------------------------------------------------------------- History:  PMH: Left Bundle Branch Block. Stroke. Risk factors: Hypertension. Diabetes mellitus. Dyslipidemia.  ------------------------------------------------------------------- Study Conclusions  - Left ventricle: The cavity size was normal. Wall thickness was increased in a pattern of moderate LVH. Systolic function was mildly to moderately reduced. The estimated ejection fraction was in the range of 40% to 45%. Diffuse hypokinesis. Doppler parameters are consistent with abnormal left ventricular relaxation (grade 1 diastolic dysfunction). - Aortic valve: There was no stenosis. - Mitral valve: There was no significant regurgitation. - Right ventricle: The cavity size was normal. Pacer wire or catheter noted in right ventricle. Systolic function was mildly reduced. - Pulmonary arteries: No complete TR doppler jet so unable to estimate PA systolic pressure. - Systemic veins: IVC measured 2.0 cm with < 50% respirophasic variation, suggesting RA pressure 8 mmHg. - Pericardium, extracardiac: A trivial pericardial effusion was identified posterior to the heart.  Impressions:  - Normal LV size with moderate LV hypertrophy. EF 40-45%. Diffuse hypokinesis. Normal RV size with mildly decreased systolic function. No significant valvular abnormalities.    ASSESSMENT AND PLAN  1. chest pain at rest  - trop negative, clean coronary on 06/10/2011, low suspicion for ACS  - Unclear cause, telemetry does show tachycardia of unknown origin, questionable SVT versus A. fib with RVR, less likely VT, however will review with MD  2.  Tachycardia with LBBB, ?SVT vs a-fib with RVR  - pending Medtronic interrogation  Addendum: device interrogated, appears to be atrial tachycardia, will increase metoprolol XL to $Rem'100mg'HSmY$  BID and discharge home  3. Acute on chronic combined systolic and diastolic heart failure (in the setting of uncontrolled HTN)  - only mildly fluid overloade, have 1-2+ pitting edema on physical exam, however no JVD, lungs clear, weight has been stable, likely only need 1 to 2 days of oral Lasix for diuresis.   - pending medtronic interrogation, will assess optivol status  Addendum: device interrogated, optival good  4.  NICM s/p Medtronic CRT-D  - interrogation as above  5. HTN 5. HLD 6. DM2 7. OSA on CPAP 8. CVA with left-sided paralysis  9. chronic LBBB 10. L foot foreign body: know needle like object on recent foot x ray on 06/11/2015, pending orthopaedic appt on Monday  Hilbert Corrigan, Hershal Coria 06/13/2015, 6:31 PM Patient seen and examined and history reviewed. Agree with above findings and plan. 49 yo BF with complex cardiac history as noted above. Presents with sudden onset sharp stabbing mid sternal chest pain after eating  at [place]. Pain lasted seconds and was intermittent now pain free. HR elevated to 130s on telemetry. She does not appear to be volume overloaded by history, exam, or CXR. ICD interrogation shows 1:1 AV conduction consistent with either sinus tachycardia or atrial tachycardia. I think it is likely the former since HR has decreased gradually to 105 with normal P waves. Her usual HR is 95-105 bpm. Optivol index looks very good. I have recommended increasing metoprolol to 100 mg bid. Continue Lasix prn as she is doing. She is stable for DC from the ED from our standpoint.   Peter Martinique, [place] 06/13/2015 6:35 PM

## 2015-06-13 NOTE — ED Notes (Signed)
Patient discharged by cardiology.  Patient's pharmacy sent for Toprol XL and follow up instructions via phone tomorrow.

## 2015-06-13 NOTE — ED Notes (Signed)
Pt here for sharp chest pain in the middle of her chest with SOB that started 30 minutes ago. Denies cough, fever. sts the pain is intermittent.

## 2015-06-13 NOTE — Progress Notes (Signed)
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  ED note and labs reviewed.    UPDATE: Patient was last seen in November 2015.  She had not shown for her last two scheduled appointments.  She currently takes Keppra 5105m twice daily.  She was seen in the ED on 06/06/15 for seizure.  She had not taken her medication for several days because she was frustrated about all the medications she has to take.  She was found to have a UTI, with UA positive for nitrite and bacteria.  She was given a Keppra load and discharged.  LDL from February was 106.  Hgb A1c from May was 7.2.  [facility]ORY: [facility] a probable cardio-embolic (secondary to low EF) right MCA territorial infarct, presenting as left sided facial droop and left arm weakness and numbness.  CT and CTA of the head showed right MCA infarct with occluded branch MCA vessel.  CTA of the neck was negative.  TpA was not given because she was out of the window.  She was unable to have an MRI due to pacemaker.  TEE did not reveal an embolic source.  She was admitted to the hospital again on 10/14/14 for witnessed seizure.  She was sitting up in bed when she suddenly her head turned to the left. She then fell backwards on the bed and became unresponsive, followed by generalized shaking for 3 minutes.  There was no tongue biting or incontinence.  She was postictal.  CT of the head showed chronic right MCA  infarct but no acute abnormalities.  She had a WBC of 15.2, thought to be due to seizure.  EEG showed right hemispheric slowing but no epileptiform discharges.  She was started on Keppra 5071mtwice daily.  No arrhythmias were found with pacemaker interrogation.  She has not had any further seizures.  She is plegic on the left side, but feels weaker since the seizure.  She requires use of a four prong cane and left boot to ambulate.  PAST MEDICAL HISTORY: Past Medical History  Diagnosis Date  . Hypertension     Poorly controlled. Has had HTN since age 89Angioedema with ACEI.  24 Hr urine and renal arterial dopplers ordered . . . Never done  . NICM (nonischemic cardiomyopathy)     EF 45-50% in 8/12, cath 6/12 showed normal coronaries, EF 50-55% by LV gram  . Morbid obesity   . Allergic rhinitis   . HLD (hyperlipidemia)   . OSA on CPAP     sleep study in 8/12 showed moderate to severe OSA requiring CPAP  . Chronic lower back pain     secondary to DJD, obsetiy, hip problems. Followed by Dr. ZaOval Linseypain management)  . Chronic diastolic heart failure      Primarily diastolic CHF: Likely due to uncontrolled HTN. Last echo (8/12) with EF 45-50%, mild to moderate LVH with some asymmetric septal hypertrophy, RV normal size and systolic function. EF 50-55% by LV-gram in 6/12.   . Polyneuropathy in diabetes(357.2)   .  Thoracic or lumbosacral neuritis or radiculitis, unspecified   . Calcifying tendinitis of shoulder   . LBBB (left bundle branch block)   . Chronic combined systolic and diastolic CHF (congestive heart failure)     EF 40-45% by echo 12/06/2012  . Coronary artery disease     questionable. LHC 05/2011 showing normal coronaries // Followed at [facility], Dr. Aundra Dubin  . GERD (gastroesophageal reflux disease)   . Shortness of breath   . Asthma   . Automatic implantable cardioverter-defibrillator in situ   . Stroke 12/2013    "my left side is paralyzed" (07/04/2014)  . Type  II diabetes mellitus DX: 2002  . 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 Degeneration of lumbar or lumbosacral intervertebral disc   . Arthritis     "hips, back, legs, arms" (07/04/2014)  . Frequent UTI   . Liver disease     MEDICATIONS: Current Outpatient Prescriptions on File Prior to Visit  Medication Sig Dispense Refill  . ACCU-CHEK FASTCLIX LANCETS MISC Use 1-2 times daily when checking blood sugar. ICD10: E11.9 102 each 11  . amLODipine (NORVASC) 5 MG tablet Take 1 tablet (5 mg total) by mouth daily. 30 tablet 6  . aspirin 325 MG tablet Take 1 tablet (325 mg total) by mouth daily. 30 tablet 5  . Blood Glucose Monitoring Suppl (ACCU-CHEK AVIVA PLUS) W/DEVICE KIT 1 each by Does not apply route 3 (three) times daily. 1 kit 0  . cephALEXin (KEFLEX) 500 MG capsule Take 1 capsule (500 mg total) by mouth 4 (four) times daily. 28 capsule 0  . cyclobenzaprine (FLEXERIL) 10 MG tablet Take 1 tablet (10 mg total) by mouth 2 (two) times daily as needed for muscle spasms. 30 tablet 1  . diclofenac sodium (VOLTAREN) 1 % GEL Apply 2 g topically 4 (four) times daily as needed (pain). 3 Tube 5  . digoxin (LANOXIN) 0.125 MG tablet Take 1 tablet (0.125 mg total) by mouth daily. 30 tablet 5  . fluticasone (CUTIVATE) 0.05 % cream Apply topically 2 (two) times daily. Dispense two tubes 30 g 6  . furosemide (LASIX) 40 MG tablet Take 1 tablet (40 mg total) by mouth daily. 90 tablet 3  . glucose blood (ACCU-CHEK SMARTVIEW) test strip Use to Check Blood Sugar TID. Code: E11.40. 100 each 12  . hydrALAZINE (APRESOLINE) 50 MG tablet Take 1.5 tablets (75 mg total) by mouth 3 (three) times daily. 270 tablet 3  . ibuprofen (ADVIL,MOTRIN) 200 MG tablet Take 200 mg by mouth every 6 (six) hours as needed for moderate pain.    . Incontinence Supplies MISC 1 year supply of Pull-ups in size XL Diagnosis Code: 788.33 1 each 0  . Insulin Glargine (LANTUS SOLOSTAR) 100 UNIT/ML Solostar Pen Inject 10 units  in the AM, 35 units in the PM 15 mL 11  . Insulin Pen Needle (GLOBAL EASE INJECT PEN NEEDLES) 31G X 5 MM MISC Use to inject insulin BID. Code: E 11.40 100 each 10  . ipratropium (ATROVENT) 0.06 % nasal spray Place 2 sprays into both nostrils 4 (four) times daily. (Patient taking differently: Place 2 sprays into both nostrils daily as needed for rhinitis. ) 15 mL 1  . levETIRAcetam (KEPPRA) 500 MG tablet Take 1 tablet (500 mg total) by mouth 2 (two) times daily. 60 tablet 3  . metFORMIN (GLUCOPHAGE) 1000 MG tablet Take 1 tablet (1,000 mg total) by mouth daily with breakfast. 90 tablet 3  . metoprolol  succinate (TOPROL-XL) 25 MG 24 hr tablet Take 52m (3 tablets) twice daily. 180 tablet 1  . nystatin cream (MYCOSTATIN) APPLY ONE APPLICATION TOPICALLY TWO TIMES DAILY 30 g 0  . oxyCODONE-acetaminophen (PERCOCET) 10-325 MG per tablet Take 1 tablet by mouth every 8 (eight) hours as needed for pain. 90 tablet 0  . pantoprazole (PROTONIX) 40 MG tablet Take 40 mg by mouth daily.    . potassium chloride 20 MEQ TBCR Take 20 mEq by mouth daily. 90 tablet 3  . simvastatin (ZOCOR) 40 MG tablet Take 40 mg by mouth at bedtime.     . Vitamin D, Ergocalciferol, (DRISDOL) 50000 UNITS CAPS capsule Take 1 capsule (50,000 Units total) by mouth every 7 (seven) days. (Patient taking differently: Take 50,000 Units by mouth every Monday. ) 24 capsule 0  . citalopram (CELEXA) 20 MG tablet Take 1 tablet (20 mg total) by mouth daily. (Patient not taking: Reported on 06/13/2015) 30 tablet 5   No current facility-administered medications on file prior to visit.    ALLERGIES: Allergies  Allergen Reactions  . Clindamycin Anaphylaxis and Swelling  . Ace Inhibitors Swelling  . Enalapril Maleate Swelling  . Lisinopril Swelling    FAMILY HISTORY: Family History  Problem Relation Age of Onset  . Heart disease Mother 424   Died of MI at age 64273yo  . Heart disease Paternal Grandmother     requiring pacemaker.  .Marland KitchenHeart  disease Paternal Grandfather 59   Died of MI at possibly age 49-53yo . Stroke Paternal Grandfather   . Heart disease Father 679   MI age 6423yorequiring stenting  . Kidney disease Mother     requiring dialysis  . Congestive Heart Failure Mother   . Diabetes Father   . Diabetes Brother   . Heart disease Brother 218   MI at age 6067years old  . Breast cancer Paternal Aunt   . Breast cancer Maternal Grandmother   . Glaucoma Father     SOCIAL HISTORY: History   Social History  . Marital Status: Single    Spouse Name: N/A  . Number of Children: 2  . Years of Education: 9th grade   Occupational History  . Unemployed     planning on getting disability   Social History Main Topics  . Smoking status: Never Smoker   . Smokeless tobacco: Never Used  . Alcohol Use: No  . Drug Use: No  . Sexual Activity:    Partners: Male    Birth Control/ Protection: Surgical   Other Topics Concern  . Not on file   Social History Narrative   [place] able to read and write fluently in [place]    REVIEW OF SYSTEMS: Constitutional: No fevers, chills, or sweats, no generalized fatigue, change in appetite Eyes: No visual changes, double vision, eye pain Ear, nose and throat: No hearing loss, ear pain, nasal congestion, sore throat Cardiovascular: No chest pain, palpitations Respiratory:  No shortness of breath at rest or with exertion, wheezes GastrointestinaI: No nausea, vomiting, diarrhea, abdominal pain, fecal incontinence Genitourinary:  No dysuria, urinary retention or frequency Musculoskeletal:  No neck pain, back pain Integumentary: No rash, pruritus, skin lesions Neurological: as above Psychiatric: No depression, insomnia, anxiety Endocrine: No palpitations, fatigue, diaphoresis, mood swings, change in appetite, change in weight, increased thirst Hematologic/Lymphatic:  No anemia, purpura, petechiae. Allergic/Immunologic: no itchy/runny eyes, nasal  congestion, recent allergic reactions, rashes  PHYSICAL EXAM: Filed  Vitals:   06/13/15 0922  BP: 180/110  Pulse: 102  Resp: 22   General: No acute distress Head:  Normocephalic/atraumatic Eyes:  Fundoscopic exam unremarkable without vessel changes, exudates, hemorrhages or papilledema. Neck: supple, no paraspinal tenderness, full range of motion Heart:  Regular rate and rhythm Lungs:  Clear to auscultation bilaterally Back: No paraspinal tenderness Neurological Exam: alert and oriented to person, place, and time, recent and remote memory intact, fund of knowledge intact, attention and concentration intact, speech fluent and not dysarthric, language intact.  Left lower facial weakness, otherwise CN II-XII intact.  Increased tone on the left.  Left upper and lower extremity plegic.  Reduced pinprick on left.  Deep tendon reflexes 3+ right upper and lower extremities, 2+ on the left.  Finger to nose testing normal on the right.    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  Recent seizure provoked due to medication non-compliance and possibly UTI History of right MCA stroke, cardio-embolic Hypertension Hyperlipidemia Type 2 diabetes mellitus   PLAN: Continue Keppra 532m twice daily.  Discussed importance of medication adherence. ASA 3252mdaily Blood pressure goal less than 130/90.  Should have rechecked with PCP. Consider switching statin to another agent (LDL goal should be less than 70) Diabetes control Weight loss Follow up in 3 months.  AdMetta ClinesDO  CC: EdLuanne BrasMD

## 2015-06-13 NOTE — Patient Instructions (Signed)
1.  Take levetericatem (Keppra) 500mg  twice daily as prescribed. 2.  Continue aspirin 325mg  daily 3.  I would like you to discuss with Dr. about changing your cholesterol medication.  The LDL (bad cholesterol) was in February.  Ideally, I would like it less than 70. 4.  See Dr. 578 again to have blood pressure rechecked 5.  Continue improving diabetes 6.  Diet and weight loss 7.  Follow up in 3 months.

## 2015-06-13 NOTE — ED Notes (Signed)
Interrogating pacer at this time.

## 2015-06-13 NOTE — ED Provider Notes (Signed)
CSN: [id number]     Arrival date & time 06/13/15  1417 History   First MD Initiated Contact with Patient 06/13/15 1429     Chief Complaint  Patient presents with  . Chest Pain      HPI Pt here for sharp chest pain in the middle of her chest with SOB that started 30 minutes ago. Denies cough, fever. sts the pain is intermittent.  Past Medical History  Diagnosis Date  . Hypertension     Poorly controlled. Has had HTN since age 49. Angioedema with ACEI.  24 Hr urine and renal arterial dopplers ordered . . . Never done  . NICM (nonischemic cardiomyopathy)     EF 45-50% in 8/12, cath 6/12 showed normal coronaries, EF 50-55% by LV gram  . Morbid obesity   . Allergic rhinitis   . HLD (hyperlipidemia)   . OSA on CPAP     sleep study in 8/12 showed moderate to severe OSA requiring CPAP  . Chronic lower back pain     secondary to DJD, obsetiy, hip problems. Followed by Dr. Oval Linsey (pain management)  . Chronic diastolic heart failure      Primarily diastolic CHF: Likely due to uncontrolled HTN. Last echo (8/12) with EF 45-50%, mild to moderate LVH with some asymmetric septal hypertrophy, RV normal size and systolic function. EF 50-55% by LV-gram in 6/12.   . Polyneuropathy in diabetes(357.2)   . Thoracic or lumbosacral neuritis or radiculitis, unspecified   . Calcifying tendinitis of shoulder   . LBBB (left bundle branch block)   . Chronic combined systolic and diastolic CHF (congestive heart failure)     EF 40-45% by echo 12/06/2012  . Coronary artery disease     questionable. LHC 05/2011 showing normal coronaries // Followed at [facility], Dr. Aundra Dubin  . GERD (gastroesophageal reflux disease)   . Shortness of breath   . Asthma   . Automatic implantable cardioverter-defibrillator in situ   . Stroke 12/2013    "my left side is paralyzed" (07/04/2014)  . Type II diabetes mellitus DX: 2002  . 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 Degeneration of lumbar or  lumbosacral intervertebral disc   . Arthritis     "hips, back, legs, arms" (07/04/2014)  . Frequent UTI   . Liver disease    Past Surgical History  Procedure Laterality Date  . Tubal ligation  05/31/1985  . Carpal tunnel release Left   . Hemiarthroplasty shoulder fracture Right 1980's  . Multiple tooth extractions  ~ 2011    tumors removed ; "my whole top"  . Cardiac catheterization  05/2011  . Tee without cardioversion N/A 01/14/2014    Procedure: TRANSESOPHAGEAL ECHOCARDIOGRAM (TEE);  Surgeon: Sanda Klein, MD;  Location: [facility] ENDOSCOPY;  Service: Cardiovascular;  Laterality: N/A;  . Breast surgery Bilateral 2011    patient reports benign results  . Bi-ventricular implantable cardioverter defibrillator  (crt-d)  08/2013    Archie Endo 08/23/2013  . Bi-ventricular implantable cardioverter defibrillator N/A 08/22/2013    Procedure: BI-VENTRICULAR IMPLANTABLE CARDIOVERTER DEFIBRILLATOR  (CRT-D);  Surgeon: Deboraha Sprang, MD;  Location: [facility] CATH LAB;  Service: Cardiovascular;  Laterality: N/A;   Family History  Problem Relation Age of Onset  . Heart disease Mother 67    Died of MI at age 49 yo  . Heart disease Paternal Grandmother     requiring pacemaker.  [name] Heart disease Paternal Grandfather 14    Died of MI at  possibly age 69-53yo  . Stroke Paternal Grandfather   . Heart disease Father 74    MI age 20yo requiring stenting  . Kidney disease Mother     requiring dialysis  . Congestive Heart Failure Mother   . Diabetes Father   . Diabetes Brother   . Heart disease Brother 44    MI at age 38 years old  . Breast cancer Paternal Aunt   . Breast cancer Maternal Grandmother   . Glaucoma Father    History  Substance Use Topics  . Smoking status: Never Smoker   . Smokeless tobacco: Never Used  . Alcohol Use: No   OB History    No data available     Review of Systems  All other systems reviewed and are negative.     Allergies  Clindamycin; Ace inhibitors; Enalapril maleate; and  Lisinopril  Home Medications   Prior to Admission medications   Medication Sig Start Date End Date Taking? Authorizing Provider  amLODipine (NORVASC) 5 MG tablet Take 1 tablet (5 mg total) by mouth daily. 12/19/14  Yes Deboraha Sprang, MD  aspirin 325 MG tablet Take 1 tablet (325 mg total) by mouth daily. 01/14/15  Yes Corky Sox, MD  cephALEXin (KEFLEX) 500 MG capsule Take 1 capsule (500 mg total) by mouth 4 (four) times daily. 06/06/15  Yes Nat Christen, MD  cyclobenzaprine (FLEXERIL) 10 MG tablet Take 1 tablet (10 mg total) by mouth 2 (two) times daily as needed for muscle spasms. 04/27/15  Yes Noemi Chapel, MD  diclofenac sodium (VOLTAREN) 1 % GEL Apply 2 g topically 4 (four) times daily as needed (pain). 02/13/15  Yes Meredith Staggers, MD  digoxin (LANOXIN) 0.125 MG tablet Take 1 tablet (0.125 mg total) by mouth daily. 02/25/15  Yes Corky Sox, MD  furosemide (LASIX) 40 MG tablet Take 1 tablet (40 mg total) by mouth daily. Patient taking differently: Take 40 mg by mouth daily as needed for fluid.  02/17/15  Yes Larey Dresser, MD  hydrALAZINE (APRESOLINE) 50 MG tablet Take 1.5 tablets (75 mg total) by mouth 3 (three) times daily. 02/17/15  Yes Larey Dresser, MD  ibuprofen (ADVIL,MOTRIN) 200 MG tablet Take 200 mg by mouth every 6 (six) hours as needed for moderate pain.   Yes Historical Provider, MD  Insulin Glargine (LANTUS SOLOSTAR) 100 UNIT/ML Solostar Pen Inject 10 units in the AM, 35 units in the PM Patient taking differently: Inject 10-35 Units into the skin See admin instructions. Inject 10 units in the AM, 35 units in the PM 10/01/14  Yes Corky Sox, MD  levETIRAcetam (KEPPRA) 500 MG tablet Take 1 tablet (500 mg total) by mouth 2 (two) times daily. 03/31/15  Yes Aldine Contes, MD  metFORMIN (GLUCOPHAGE) 1000 MG tablet Take 1 tablet (1,000 mg total) by mouth daily with breakfast. 04/15/15  Yes Corky Sox, MD  oxyCODONE-acetaminophen (PERCOCET) 10-325 MG per tablet Take 1 tablet by mouth  every 8 (eight) hours as needed for pain. 06/09/15  Yes Bayard Hugger, NP  pantoprazole (PROTONIX) 40 MG tablet Take 40 mg by mouth daily.   Yes Historical Provider, MD  potassium chloride 20 MEQ TBCR Take 20 mEq by mouth daily. 08/07/14  Yes Rande Brunt, NP  simvastatin (ZOCOR) 40 MG tablet Take 40 mg by mouth at bedtime.    Yes Historical Provider, MD  Vitamin D, Ergocalciferol, (DRISDOL) 50000 UNITS CAPS capsule Take 1 capsule (50,000 Units total) by mouth every  7 (seven) days. Patient taking differently: Take 50,000 Units by mouth every Monday.  02/19/15  Yes Annia Belt, MD  ACCU-CHEK FASTCLIX LANCETS MISC Use 1-2 times daily when checking blood sugar. ICD10: E11.9 05/08/15   Corky Sox, MD  Blood Glucose Monitoring Suppl (ACCU-CHEK AVIVA PLUS) W/DEVICE KIT 1 each by Does not apply route 3 (three) times daily. 01/14/15   Corky Sox, MD  citalopram (CELEXA) 20 MG tablet Take 1 tablet (20 mg total) by mouth daily. Patient not taking: Reported on 06/13/2015 04/14/15 04/11/16  Corky Sox, MD  fluticasone (CUTIVATE) 0.05 % cream Apply topically 2 (two) times daily. Dispense two tubes Patient not taking: Reported on 06/13/2015 02/25/15   Corky Sox, MD  glucose blood (ACCU-CHEK SMARTVIEW) test strip Use to Check Blood Sugar TID. Code: E11.40. 09/30/14   Corky Sox, MD  Incontinence Supplies MISC 1 year supply of Pull-ups in size XL Diagnosis Code: 788.33 08/27/14   Corky Sox, MD  Insulin Pen Needle (GLOBAL EASE INJECT PEN NEEDLES) 31G X 5 MM MISC Use to inject insulin BID. Code: E 11.40 09/30/14   Corky Sox, MD  ipratropium (ATROVENT) 0.06 % nasal spray Place 2 sprays into both nostrils 4 (four) times daily. Patient not taking: Reported on 06/13/2015 01/07/15   Billy Fischer, MD  metoprolol succinate (TOPROL-XL) 100 MG 24 hr tablet Take 1 tablet (100 mg total) by mouth 2 (two) times daily. 06/13/15   Dayna N Dunn, PA-C  nystatin cream (MYCOSTATIN) APPLY ONE APPLICATION TOPICALLY TWO TIMES  DAILY Patient not taking: Reported on 06/13/2015 02/24/15   Corky Sox, MD   BP 113/75 mmHg  Pulse 103  Temp(Src) 97.9 F (36.6 C)  Resp 16  Wt 272 lb (123.378 kg)  SpO2 99%  LMP 05/17/2015 Physical Exam  Constitutional: She is oriented to person, place, and time. She appears well-developed and well-nourished. No distress.  HENT:  Head: Normocephalic and atraumatic.  Eyes: Pupils are equal, round, and reactive to light.  Neck: Normal range of motion.  Cardiovascular: Intact distal pulses.  Tachycardia present.   Pulmonary/Chest: No respiratory distress.  Abdominal: Normal appearance. She exhibits no distension.  Musculoskeletal: Normal range of motion.  Neurological: She is alert and oriented to person, place, and time. No cranial nerve deficit.  Skin: Skin is warm and dry. No rash noted.  Psychiatric: She has a normal mood and affect. Her behavior is normal.  Nursing note and vitals reviewed.   ED Course  Procedures (including critical care time) Labs Review Labs Reviewed  BASIC METABOLIC PANEL - Abnormal; Notable for the following:    Glucose, Bld 234 (*)    All other components within normal limits  CBC  BRAIN NATRIURETIC PEPTIDE  I-STAT TROPOININ, ED    Imaging Review No results found.   EKG Interpretation   Date/Time:  Friday June 13 2015 14:22:10 EDT Ventricular Rate:  135 PR Interval:    QRS Duration: 136 QT Interval:  394 QTC Calculation: 591 R Axis:   58 Text Interpretation:  Wide QRS tachycardia Left bundle branch block  Abnormal ECG Confirmed by Teyla Skidgel  MD, Amiee Wiley ([id number]) on 06/13/2015 2:37:52  PM     Discussed with Dr. Haroldine Laws (CHF clinic) who recommended I call cardiology. MDM   Final diagnoses:  Chest pain  Chronic diastolic heart failure        Leonard Schwartz, MD 06/29/15 [id number]

## 2015-06-13 NOTE — ED Notes (Signed)
Arrived to E [facility] from radiology.

## 2015-06-13 NOTE — Progress Notes (Signed)
INTERNAL MEDICINE TEACHING ATTENDING ADDENDUM - Aalaiyah Yassin, MD: I reviewed and discussed at the time of visit with the resident Dr. Jones, the patient's medical history, physical examination, diagnosis and results of pertinent tests and treatment and I agree with the patient's care as documented.  

## 2015-06-13 NOTE — Telephone Encounter (Signed)
ER nurse called with question about patient's medication - she was inquiring where patient's rx for Toprol was. Per review of chart it was sent in to patient's mail-order pharmacy. This will take some time to process so I asked her where we should send this in locally. The patient requested [facility] on [address]. Will send in 30-day supply to allow for the mail-order rx to be delivered. Will also ask consulting cardiology PA to clarify plan for follow-up - not sure if appt was made. I told ER to notify patient that our office will call her with this information.  Mabelle Mungin PA-C

## 2015-06-13 NOTE — ED Notes (Signed)
Patient given a knee sleeve.
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
## 2015-06-24 ENCOUNTER — Encounter: Payer: Medicaid Other | Admitting: *Deleted

## 2015-06-24 ENCOUNTER — Telehealth: Payer: Self-pay | Admitting: Cardiology

## 2015-06-24 NOTE — Telephone Encounter (Signed)
Spoke with pt and reminded pt of remote transmission that is due today. Pt verbalized understanding.   

## 2015-06-25 ENCOUNTER — Encounter: Payer: Self-pay | Admitting: Cardiology

## 2015-06-26 ENCOUNTER — Ambulatory Visit: Payer: Medicaid Other | Admitting: Internal Medicine

## 2015-06-30 ENCOUNTER — Encounter: Payer: Self-pay | Admitting: Internal Medicine

## 2015-06-30 ENCOUNTER — Ambulatory Visit: Payer: Medicaid Other | Admitting: Neurology

## 2015-06-30 ENCOUNTER — Ambulatory Visit (INDEPENDENT_AMBULATORY_CARE_PROVIDER_SITE_OTHER): Payer: Medicaid Other | Admitting: Internal Medicine

## 2015-06-30 VITALS — BP 143/98 | HR 104 | Temp 98.1°F | Ht 69.0 in | Wt 268.7 lb

## 2015-06-30 DIAGNOSIS — L97529 Non-pressure chronic ulcer of other part of left foot with unspecified severity: Secondary | ICD-10-CM

## 2015-06-30 DIAGNOSIS — S90852D Superficial foreign body, left foot, subsequent encounter: Secondary | ICD-10-CM | POA: Diagnosis not present

## 2015-06-30 DIAGNOSIS — I69354 Hemiplegia and hemiparesis following cerebral infarction affecting left non-dominant side: Secondary | ICD-10-CM | POA: Diagnosis not present

## 2015-06-30 DIAGNOSIS — IMO0002 Reserved for concepts with insufficient information to code with codable children: Secondary | ICD-10-CM

## 2015-06-30 DIAGNOSIS — X58XXXD Exposure to other specified factors, subsequent encounter: Secondary | ICD-10-CM

## 2015-06-30 DIAGNOSIS — M25512 Pain in left shoulder: Secondary | ICD-10-CM | POA: Insufficient documentation

## 2015-06-30 DIAGNOSIS — E1165 Type 2 diabetes mellitus with hyperglycemia: Secondary | ICD-10-CM

## 2015-06-30 DIAGNOSIS — E1140 Type 2 diabetes mellitus with diabetic neuropathy, unspecified: Secondary | ICD-10-CM

## 2015-06-30 DIAGNOSIS — M25511 Pain in right shoulder: Secondary | ICD-10-CM | POA: Diagnosis not present

## 2015-06-30 HISTORY — DX: Pain in left shoulder: M25.512

## 2015-06-30 LAB — GLUCOSE, CAPILLARY: Glucose-Capillary: 172 mg/dL — ABNORMAL HIGH (ref 65–99)

## 2015-06-30 NOTE — Assessment & Plan Note (Addendum)
The patient and her [facility] nurse (who is present for the exam) say that the wound looks better than when she was seen two weeks ago.  It is only painful when she wears shoes or when the area is touched.  She has been taking more Percocet (prescribed by pain management) to help with the pain.  She denies fever/chills, appetite change, N/V.  On exam, the ulcer is 0.5cm, minimal surrounding erythema, I can express a very small amount of clear drainage if I push hard.  The area is tender to palpation.  There was no evidence of osteo on imaging two weeks ago.  She is due to see wound care tomorrow. - see wound care tomorrow for further management - she will come back to see [place] if she has worsening symptoms or systemic symptoms - advised to keep pressure off of wound - she says paperwork was sent to PCP for DM shoes; I will follow-up with her PCP regarding this
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
## 2015-06-30 NOTE — Progress Notes (Signed)
Subjective:    Patient ID: [name], female    DOB: [birth year], 49 y.o.   MRN: [id number]  HPI Comments: Ms. Paolillo is a 49 year old woman with PMH as below here for follow-up of left foot pain. Please see problem based charting for assessment and plan.   Foot Injury   Shoulder Pain  Pertinent negatives include no fever.     Past Medical History  Diagnosis Date  . Hypertension     Poorly controlled. Has had HTN since age 62. Angioedema with ACEI.  24 Hr urine and renal arterial dopplers ordered . . . Never done  . NICM (nonischemic cardiomyopathy)     EF 45-50% in 8/12, cath 6/12 showed normal coronaries, EF 50-55% by LV gram  . Morbid obesity   . Allergic rhinitis   . HLD (hyperlipidemia)   . OSA on CPAP     sleep study in 8/12 showed moderate to severe OSA requiring CPAP  . Chronic lower back pain     secondary to DJD, obsetiy, hip problems. Followed by Dr. Oval Linsey (pain management)  . Chronic diastolic heart failure      Primarily diastolic CHF: Likely due to uncontrolled HTN. Last echo (8/12) with EF 45-50%, mild to moderate LVH with some asymmetric septal hypertrophy, RV normal size and systolic function. EF 50-55% by LV-gram in 6/12.   . Polyneuropathy in diabetes(357.2)   . Thoracic or lumbosacral neuritis or radiculitis, unspecified   . Calcifying tendinitis of shoulder   . LBBB (left bundle branch block)   . Chronic combined systolic and diastolic CHF (congestive heart failure)     EF 40-45% by echo 12/06/2012  . Coronary artery disease     questionable. LHC 05/2011 showing normal coronaries // Followed at [facility], Dr. Aundra Dubin  . GERD (gastroesophageal reflux disease)   . Shortness of breath   . Asthma   . Automatic implantable cardioverter-defibrillator in situ   . Stroke 12/2013    "my left side is paralyzed" (07/04/2014)  . Type II diabetes mellitus DX: 2002  . 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 Degeneration of  lumbar or lumbosacral intervertebral disc   . Arthritis     "hips, back, legs, arms" (07/04/2014)  . Frequent UTI   . Liver disease    Current Outpatient Prescriptions on File Prior to Visit  Medication Sig Dispense Refill  . ACCU-CHEK FASTCLIX LANCETS MISC Use 1-2 times daily when checking blood sugar. ICD10: E11.9 102 each 11  . amLODipine (NORVASC) 5 MG tablet Take 1 tablet (5 mg total) by mouth daily. 30 tablet 6  . aspirin 325 MG tablet Take 1 tablet (325 mg total) by mouth daily. 30 tablet 5  . Blood Glucose Monitoring Suppl (ACCU-CHEK AVIVA PLUS) W/DEVICE KIT 1 each by Does not apply route 3 (three) times daily. 1 kit 0  . cephALEXin (KEFLEX) 500 MG capsule Take 1 capsule (500 mg total) by mouth 4 (four) times daily. 28 capsule 0  . citalopram (CELEXA) 20 MG tablet Take 1 tablet (20 mg total) by mouth daily. (Patient not taking: Reported on 06/13/2015) 30 tablet 5  . cyclobenzaprine (FLEXERIL) 10 MG tablet Take 1 tablet (10 mg total) by mouth 2 (two) times daily as needed for muscle spasms. 30 tablet 1  . diclofenac sodium (VOLTAREN) 1 % GEL Apply 2 g topically 4 (four) times daily as needed (pain). 3 Tube 5  . digoxin (LANOXIN) 0.125  MG tablet Take 1 tablet (0.125 mg total) by mouth daily. 30 tablet 5  . fluticasone (CUTIVATE) 0.05 % cream Apply topically 2 (two) times daily. Dispense two tubes (Patient not taking: Reported on 06/13/2015) 30 g 6  . furosemide (LASIX) 40 MG tablet Take 1 tablet (40 mg total) by mouth daily. (Patient taking differently: Take 40 mg by mouth daily as needed for fluid. ) 90 tablet 3  . glucose blood (ACCU-CHEK SMARTVIEW) test strip Use to Check Blood Sugar TID. Code: E11.40. 100 each 12  . hydrALAZINE (APRESOLINE) 50 MG tablet Take 1.5 tablets (75 mg total) by mouth 3 (three) times daily. 270 tablet 3  . ibuprofen (ADVIL,MOTRIN) 200 MG tablet Take 200 mg by mouth every 6 (six) hours as needed for moderate pain.    . Incontinence Supplies MISC 1 year supply of  Pull-ups in size XL Diagnosis Code: 788.33 1 each 0  . Insulin Glargine (LANTUS SOLOSTAR) 100 UNIT/ML Solostar Pen Inject 10 units in the AM, 35 units in the PM (Patient taking differently: Inject 10-35 Units into the skin See admin instructions. Inject 10 units in the AM, 35 units in the PM) 15 mL 11  . Insulin Pen Needle (GLOBAL EASE INJECT PEN NEEDLES) 31G X 5 MM MISC Use to inject insulin BID. Code: E 11.40 100 each 10  . ipratropium (ATROVENT) 0.06 % nasal spray Place 2 sprays into both nostrils 4 (four) times daily. (Patient not taking: Reported on 06/13/2015) 15 mL 1  . levETIRAcetam (KEPPRA) 500 MG tablet Take 1 tablet (500 mg total) by mouth 2 (two) times daily. 60 tablet 3  . metFORMIN (GLUCOPHAGE) 1000 MG tablet Take 1 tablet (1,000 mg total) by mouth daily with breakfast. 90 tablet 3  . metoprolol succinate (TOPROL-XL) 100 MG 24 hr tablet Take 1 tablet (100 mg total) by mouth 2 (two) times daily. 60 tablet 0  . nystatin cream (MYCOSTATIN) APPLY ONE APPLICATION TOPICALLY TWO TIMES DAILY (Patient not taking: Reported on 06/13/2015) 30 g 0  . oxyCODONE-acetaminophen (PERCOCET) 10-325 MG per tablet Take 1 tablet by mouth every 8 (eight) hours as needed for pain. 90 tablet 0  . pantoprazole (PROTONIX) 40 MG tablet Take 40 mg by mouth daily.    . potassium chloride 20 MEQ TBCR Take 20 mEq by mouth daily. 90 tablet 3  . simvastatin (ZOCOR) 40 MG tablet Take 40 mg by mouth at bedtime.     . Vitamin D, Ergocalciferol, (DRISDOL) 50000 UNITS CAPS capsule Take 1 capsule (50,000 Units total) by mouth every 7 (seven) days. (Patient taking differently: Take 50,000 Units by mouth every Monday. ) 24 capsule 0   No current facility-administered medications on file prior to visit.    Review of Systems  Constitutional: Negative for fever, chills and appetite change.  Gastrointestinal: Positive for constipation. Negative for nausea, vomiting, abdominal pain and diarrhea.       Constipated pass 3 days,  passing flatus.  Genitourinary: Negative for dysuria and frequency.  Skin: Positive for wound.  Neurological: Positive for weakness.       No new deficits s/p CVA       Filed Vitals:   06/30/15 1050  BP: 143/98  Pulse: 104  Temp: 98.1 F (36.7 C)  TempSrc: Oral  Height: 5' 9" (1.753 m)  Weight: 268 lb 11.2 oz (121.882 kg)  SpO2: 100%     Objective:   Physical Exam  Constitutional: She is oriented to person, place, and time. She appears well-developed. No  distress.  HENT:  Head: Normocephalic and atraumatic.  Mouth/Throat: Oropharynx is clear and moist. No oropharyngeal exudate.  Eyes: EOM are normal. Pupils are equal, round, and reactive to light.  Neck: Neck supple.  Cardiovascular: Normal rate, regular rhythm and normal heart sounds.  Exam reveals no gallop and no friction rub.   No murmur heard. Pulmonary/Chest: Effort normal and breath sounds normal. No respiratory distress. She has no wheezes. She has no rales.  Abdominal: Soft. Bowel sounds are normal. She exhibits no distension and no mass. There is no tenderness. There is no rebound and no guarding.  Musculoskeletal: Normal range of motion. She exhibits tenderness. She exhibits no edema.  Lateral side of the head of the left first metatarsal - 0.5cm ulcer, minimal redness, TTP.  No edema, induration or fluctuance.  Right shoulder TTP at top and front of joint.  ROM is full.  Strength and sensation intact.  No swelling, redness or wounds.  Neurological: She is alert and oriented to person, place, and time. No cranial nerve deficit.  Decreased strength LUE and LLE s/p CVA  Skin: Skin is warm. She is not diaphoretic.  Psychiatric: She has a normal mood and affect. Her behavior is normal. Judgment and thought content normal.          Assessment & Plan:  Please see problem based charting for assessment and plan.

## 2015-06-30 NOTE — Assessment & Plan Note (Signed)
She says she was evaluated by Dr. Lajoyce Corners.  Per patient, he told her that removing the pin from her foot may do more harm than good so the decision was made to leave it in place.

## 2015-06-30 NOTE — Patient Instructions (Signed)
1. Please follow-up with wound care tomorrow.  I will check with Dr. Yetta Barre about the paperwork for your diabetic shoes.  In the meantime, please try to keep pressure off of the wound.   2. Please take all medications as prescribed.    3. If you have worsening of your symptoms or new symptoms arise, please call the clinic ([phone]), or go to the ER immediately if symptoms are severe.

## 2015-07-01 ENCOUNTER — Encounter (HOSPITAL_BASED_OUTPATIENT_CLINIC_OR_DEPARTMENT_OTHER): Payer: Medicaid Other | Attending: General Surgery

## 2015-07-01 DIAGNOSIS — X58XXXA Exposure to other specified factors, initial encounter: Secondary | ICD-10-CM | POA: Insufficient documentation

## 2015-07-01 DIAGNOSIS — S91302A Unspecified open wound, left foot, initial encounter: Secondary | ICD-10-CM | POA: Insufficient documentation

## 2015-07-01 DIAGNOSIS — I509 Heart failure, unspecified: Secondary | ICD-10-CM | POA: Diagnosis not present

## 2015-07-01 DIAGNOSIS — I2510 Atherosclerotic heart disease of native coronary artery without angina pectoris: Secondary | ICD-10-CM | POA: Insufficient documentation

## 2015-07-01 DIAGNOSIS — M069 Rheumatoid arthritis, unspecified: Secondary | ICD-10-CM | POA: Diagnosis not present

## 2015-07-01 DIAGNOSIS — E1140 Type 2 diabetes mellitus with diabetic neuropathy, unspecified: Secondary | ICD-10-CM | POA: Diagnosis not present

## 2015-07-01 DIAGNOSIS — G40909 Epilepsy, unspecified, not intractable, without status epilepticus: Secondary | ICD-10-CM | POA: Insufficient documentation

## 2015-07-03 ENCOUNTER — Ambulatory Visit (HOSPITAL_COMMUNITY)
Admission: RE | Admit: 2015-07-03 | Discharge: 2015-07-03 | Disposition: A | Discharge status: Home / Self Care | Payer: Medicaid Other | Source: Ambulatory Visit | Attending: Internal Medicine | Admitting: Internal Medicine

## 2015-07-03 ENCOUNTER — Ambulatory Visit (HOSPITAL_COMMUNITY)
Admission: RE | Admit: 2015-07-03 | Discharge: 2015-07-03 | Disposition: A | Discharge status: Home / Self Care | Payer: Medicaid Other | Source: Ambulatory Visit | Attending: General Surgery | Admitting: General Surgery

## 2015-07-03 ENCOUNTER — Other Ambulatory Visit (HOSPITAL_BASED_OUTPATIENT_CLINIC_OR_DEPARTMENT_OTHER): Payer: Self-pay | Admitting: General Surgery

## 2015-07-03 ENCOUNTER — Encounter: Payer: Self-pay | Admitting: Internal Medicine

## 2015-07-03 ENCOUNTER — Ambulatory Visit (INDEPENDENT_AMBULATORY_CARE_PROVIDER_SITE_OTHER): Payer: Medicaid Other | Admitting: Internal Medicine

## 2015-07-03 VITALS — BP 140/80 | HR 96 | Temp 98.4°F | Ht 69.0 in | Wt 268.0 lb

## 2015-07-03 DIAGNOSIS — E1140 Type 2 diabetes mellitus with diabetic neuropathy, unspecified: Secondary | ICD-10-CM

## 2015-07-03 DIAGNOSIS — B372 Candidiasis of skin and nail: Secondary | ICD-10-CM | POA: Diagnosis not present

## 2015-07-03 DIAGNOSIS — M795 Residual foreign body in soft tissue: Secondary | ICD-10-CM | POA: Diagnosis not present

## 2015-07-03 DIAGNOSIS — M7989 Other specified soft tissue disorders: Secondary | ICD-10-CM | POA: Diagnosis not present

## 2015-07-03 DIAGNOSIS — I10 Essential (primary) hypertension: Secondary | ICD-10-CM | POA: Diagnosis not present

## 2015-07-03 DIAGNOSIS — G8110 Spastic hemiplegia affecting unspecified side: Secondary | ICD-10-CM | POA: Diagnosis not present

## 2015-07-03 DIAGNOSIS — E11621 Type 2 diabetes mellitus with foot ulcer: Secondary | ICD-10-CM

## 2015-07-03 DIAGNOSIS — E1165 Type 2 diabetes mellitus with hyperglycemia: Secondary | ICD-10-CM

## 2015-07-03 DIAGNOSIS — M25511 Pain in right shoulder: Secondary | ICD-10-CM | POA: Insufficient documentation

## 2015-07-03 DIAGNOSIS — IMO0002 Reserved for concepts with insufficient information to code with codable children: Secondary | ICD-10-CM

## 2015-07-03 DIAGNOSIS — M79672 Pain in left foot: Secondary | ICD-10-CM | POA: Diagnosis present

## 2015-07-03 DIAGNOSIS — J069 Acute upper respiratory infection, unspecified: Secondary | ICD-10-CM | POA: Diagnosis not present

## 2015-07-03 DIAGNOSIS — R52 Pain, unspecified: Secondary | ICD-10-CM

## 2015-07-03 DIAGNOSIS — R350 Frequency of micturition: Secondary | ICD-10-CM | POA: Diagnosis present

## 2015-07-03 DIAGNOSIS — L97529 Non-pressure chronic ulcer of other part of left foot with unspecified severity: Secondary | ICD-10-CM

## 2015-07-03 DIAGNOSIS — R21 Rash and other nonspecific skin eruption: Secondary | ICD-10-CM | POA: Diagnosis not present

## 2015-07-03 DIAGNOSIS — E119 Type 2 diabetes mellitus without complications: Secondary | ICD-10-CM

## 2015-07-03 LAB — GLUCOSE, CAPILLARY: GLUCOSE-CAPILLARY: 134 mg/dL — AB (ref 65–99)

## 2015-07-03 NOTE — Patient Instructions (Signed)
1. Please return to see Dr. Yetta Barre next month.   2. Please take all medications as prescribed.    3. If you have worsening of your symptoms or new symptoms arise, please call the clinic ([phone]), or go to the ER immediately if symptoms are severe.

## 2015-07-03 NOTE — Progress Notes (Signed)
Subjective:    Patient ID: [name], female    DOB: 1966-07-22, 49 y.o.   MRN: [id number]  HPI Comments: [name] is a 49 year old woman with PMH as below here for foot exam for DM shoes.  Please see problem based charting for assessment and plan.     Past Medical History  Diagnosis Date  . Hypertension     Poorly controlled. Has had HTN since age 49. Angioedema with ACEI.  24 Hr urine and renal arterial dopplers ordered . . . Never done  . NICM (nonischemic cardiomyopathy)     EF 45-50% in 8/12, cath 6/12 showed normal coronaries, EF 50-55% by LV gram  . Morbid obesity   . Allergic rhinitis   . HLD (hyperlipidemia)   . OSA on CPAP     sleep study in 8/12 showed moderate to severe OSA requiring CPAP  . Chronic lower back pain     secondary to DJD, obsetiy, hip problems. Followed by Dr. Oval Linsey (pain management)  . Chronic diastolic heart failure      Primarily diastolic CHF: Likely due to uncontrolled HTN. Last echo (8/12) with EF 45-50%, mild to moderate LVH with some asymmetric septal hypertrophy, RV normal size and systolic function. EF 50-55% by LV-gram in 6/12.   . Polyneuropathy in diabetes(357.2)   . Thoracic or lumbosacral neuritis or radiculitis, unspecified   . Calcifying tendinitis of shoulder   . LBBB (left bundle branch block)   . Chronic combined systolic and diastolic CHF (congestive heart failure)     EF 40-45% by echo 12/06/2012  . Coronary artery disease     questionable. LHC 05/2011 showing normal coronaries // Followed at [facility], Dr. Aundra Dubin  . GERD (gastroesophageal reflux disease)   . Shortness of breath   . Asthma   . Automatic implantable cardioverter-defibrillator in situ   . Stroke 12/2013    "my left side is paralyzed" (07/04/2014)  . Type II diabetes mellitus DX: 2002  . 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 Degeneration of lumbar or lumbosacral intervertebral disc   . Arthritis     "hips, back, legs,  arms" (07/04/2014)  . Frequent UTI   . Liver disease    Current Outpatient Prescriptions on File Prior to Visit  Medication Sig Dispense Refill  . ACCU-CHEK FASTCLIX LANCETS MISC Use 1-2 times daily when checking blood sugar. ICD10: E11.9 102 each 11  . amLODipine (NORVASC) 5 MG tablet Take 1 tablet (5 mg total) by mouth daily. 30 tablet 6  . aspirin 325 MG tablet Take 1 tablet (325 mg total) by mouth daily. 30 tablet 5  . Blood Glucose Monitoring Suppl (ACCU-CHEK AVIVA PLUS) W/DEVICE KIT 1 each by Does not apply route 3 (three) times daily. 1 kit 0  . citalopram (CELEXA) 20 MG tablet Take 1 tablet (20 mg total) by mouth daily. (Patient not taking: Reported on 06/13/2015) 30 tablet 5  . cyclobenzaprine (FLEXERIL) 10 MG tablet Take 1 tablet (10 mg total) by mouth 2 (two) times daily as needed for muscle spasms. 30 tablet 1  . diclofenac sodium (VOLTAREN) 1 % GEL Apply 2 g topically 4 (four) times daily as needed (pain). 3 Tube 5  . digoxin (LANOXIN) 0.125 MG tablet Take 1 tablet (0.125 mg total) by mouth daily. 30 tablet 5  . fluticasone (CUTIVATE) 0.05 % cream Apply topically 2 (two) times daily. Dispense two tubes (Patient not taking: Reported on  06/13/2015) 30 g 6  . furosemide (LASIX) 40 MG tablet Take 1 tablet (40 mg total) by mouth daily. (Patient taking differently: Take 40 mg by mouth daily as needed for fluid. ) 90 tablet 3  . glucose blood (ACCU-CHEK SMARTVIEW) test strip Use to Check Blood Sugar TID. Code: E11.40. 100 each 12  . hydrALAZINE (APRESOLINE) 50 MG tablet Take 1.5 tablets (75 mg total) by mouth 3 (three) times daily. 270 tablet 3  . ibuprofen (ADVIL,MOTRIN) 200 MG tablet Take 200 mg by mouth every 6 (six) hours as needed for moderate pain.    . Incontinence Supplies MISC 1 year supply of Pull-ups in size XL Diagnosis Code: 788.33 1 each 0  . Insulin Glargine (LANTUS SOLOSTAR) 100 UNIT/ML Solostar Pen Inject 10 units in the AM, 35 units in the PM (Patient taking differently:  Inject 10-35 Units into the skin See admin instructions. Inject 10 units in the AM, 35 units in the PM) 15 mL 11  . Insulin Pen Needle (GLOBAL EASE INJECT PEN NEEDLES) 31G X 5 MM MISC Use to inject insulin BID. Code: E 11.40 100 each 10  . ipratropium (ATROVENT) 0.06 % nasal spray Place 2 sprays into both nostrils 4 (four) times daily. (Patient not taking: Reported on 06/13/2015) 15 mL 1  . levETIRAcetam (KEPPRA) 500 MG tablet Take 1 tablet (500 mg total) by mouth 2 (two) times daily. 60 tablet 3  . metFORMIN (GLUCOPHAGE) 1000 MG tablet Take 1 tablet (1,000 mg total) by mouth daily with breakfast. 90 tablet 3  . metoprolol succinate (TOPROL-XL) 100 MG 24 hr tablet Take 1 tablet (100 mg total) by mouth 2 (two) times daily. 60 tablet 0  . nystatin cream (MYCOSTATIN) APPLY ONE APPLICATION TOPICALLY TWO TIMES DAILY (Patient not taking: Reported on 06/13/2015) 30 g 0  . oxyCODONE-acetaminophen (PERCOCET) 10-325 MG per tablet Take 1 tablet by mouth every 8 (eight) hours as needed for pain. 90 tablet 0  . pantoprazole (PROTONIX) 40 MG tablet Take 40 mg by mouth daily.    . potassium chloride 20 MEQ TBCR Take 20 mEq by mouth daily. 90 tablet 3  . simvastatin (ZOCOR) 40 MG tablet Take 40 mg by mouth at bedtime.     . Vitamin D, Ergocalciferol, (DRISDOL) 50000 UNITS CAPS capsule Take 1 capsule (50,000 Units total) by mouth every 7 (seven) days. (Patient taking differently: Take 50,000 Units by mouth every Monday. ) 24 capsule 0   No current facility-administered medications on file prior to visit.    Review of Systems  Constitutional: Negative for fever, chills and appetite change.  Respiratory: Negative for shortness of breath.   Cardiovascular: Negative for chest pain.  Gastrointestinal: Negative for nausea and vomiting.  Skin: Positive for rash.       Healing wound on left toe.  No new wounds.  Intertrigo beneath left pannus.       Filed Vitals:   07/03/15 0942 07/03/15 1059  BP: 151/95 140/80    Pulse: 106 96  Temp: 98.4 F (36.9 C)   TempSrc: Oral   Height: $Remove'5\' 9"'[id number]$  (1.753 m)   Weight: 268 lb (121.564 kg)   SpO2: 100%      Objective:   Physical Exam  Constitutional: She is oriented to person, place, and time. She appears well-developed. No distress.  HENT:  Head: Normocephalic and atraumatic.  Mouth/Throat: Oropharynx is clear and moist. No oropharyngeal exudate.  Eyes: EOM are normal. Pupils are equal, round, and reactive to light.  Neck: Neck  supple.  Cardiovascular: Normal rate, regular rhythm, normal heart sounds and intact distal pulses.  Exam reveals no gallop and no friction rub.   No murmur heard. 2+ dorsalis pedis pulses B/L  Pulmonary/Chest: Effort normal and breath sounds normal. No respiratory distress. She has no wheezes. She has no rales.  Abdominal: Soft. Bowel sounds are normal. She exhibits no distension and no mass. There is no tenderness. There is no rebound and no guarding.  Musculoskeletal: Normal range of motion. She exhibits no edema or tenderness.  Neurological: She is alert and oriented to person, place, and time. No cranial nerve deficit.  Normal sensation B/L feet (sharp and light touch), toe proprioception intact, normal achilles reflexes.    Skin: Skin is warm. She is not diaphoretic.  Medial side of the head of the left first metatarsal - 0.5cm ulcer, no sign of infection Erythematous skin under left pannus covered with white cream (applied earlier today)  Psychiatric: She has a normal mood and affect. Her behavior is normal. Judgment and thought content normal.  Vitals reviewed.         Assessment & Plan:  Please see problem based charting for assessment and plan.

## 2015-07-04 ENCOUNTER — Other Ambulatory Visit: Payer: Self-pay | Admitting: Internal Medicine

## 2015-07-04 DIAGNOSIS — M21829 Other specified acquired deformities of unspecified upper arm: Secondary | ICD-10-CM

## 2015-07-04 DIAGNOSIS — E119 Type 2 diabetes mellitus without complications: Secondary | ICD-10-CM | POA: Insufficient documentation

## 2015-07-04 DIAGNOSIS — M25511 Pain in right shoulder: Secondary | ICD-10-CM

## 2015-07-04 MED ORDER — NYSTATIN 100000 UNIT/GM EX POWD: CUTANEOUS | Status: DC

## 2015-07-04 NOTE — Assessment & Plan Note (Signed)
Pruritic, red rash beneath left pannus.  She has been using topical steroid cream the past three days.   - d/c steroid cream - rx nystatin powder

## 2015-07-04 NOTE — Assessment & Plan Note (Signed)
DM foot exam completed.  Feet neuro and vascular intact.  She was seen by wound care on 07/19 and the ulcer was debrided and looks like it is improving.  No new wounds on feet.  She will benefit from DM shoes to help protect her feet.  Paperwork has been signed by her PCP.

## 2015-07-04 NOTE — Progress Notes (Signed)
Medicine attending: Medical history, presenting problems, physical findings, and medications, reviewed with Dr Alex Wilson on the day of the patient visit   and I concur with her evaluation and management plan. 

## 2015-07-07 ENCOUNTER — Encounter: Payer: Medicaid Other | Attending: Physical Medicine & Rehabilitation | Admitting: Physical Medicine & Rehabilitation

## 2015-07-07 ENCOUNTER — Encounter: Payer: Self-pay | Admitting: Physical Medicine & Rehabilitation

## 2015-07-07 ENCOUNTER — Encounter: Payer: Medicaid Other | Admitting: Internal Medicine

## 2015-07-07 VITALS — BP 181/115 | HR 104 | Resp 16

## 2015-07-07 DIAGNOSIS — Z8673 Personal history of transient ischemic attack (TIA), and cerebral infarction without residual deficits: Secondary | ICD-10-CM

## 2015-07-07 DIAGNOSIS — Z87898 Personal history of other specified conditions: Secondary | ICD-10-CM

## 2015-07-07 DIAGNOSIS — I5042 Chronic combined systolic (congestive) and diastolic (congestive) heart failure: Secondary | ICD-10-CM | POA: Insufficient documentation

## 2015-07-07 DIAGNOSIS — Z5181 Encounter for therapeutic drug level monitoring: Secondary | ICD-10-CM | POA: Insufficient documentation

## 2015-07-07 DIAGNOSIS — I63319 Cerebral infarction due to thrombosis of unspecified middle cerebral artery: Secondary | ICD-10-CM | POA: Diagnosis present

## 2015-07-07 DIAGNOSIS — E1342 Other specified diabetes mellitus with diabetic polyneuropathy: Secondary | ICD-10-CM | POA: Insufficient documentation

## 2015-07-07 DIAGNOSIS — G8110 Spastic hemiplegia affecting unspecified side: Secondary | ICD-10-CM | POA: Insufficient documentation

## 2015-07-07 DIAGNOSIS — Z8669 Personal history of other diseases of the nervous system and sense organs: Secondary | ICD-10-CM | POA: Diagnosis not present

## 2015-07-07 DIAGNOSIS — Z79899 Other long term (current) drug therapy: Secondary | ICD-10-CM | POA: Diagnosis present

## 2015-07-07 DIAGNOSIS — G629 Polyneuropathy, unspecified: Secondary | ICD-10-CM | POA: Insufficient documentation

## 2015-07-07 DIAGNOSIS — E1142 Type 2 diabetes mellitus with diabetic polyneuropathy: Secondary | ICD-10-CM

## 2015-07-07 DIAGNOSIS — M47817 Spondylosis without myelopathy or radiculopathy, lumbosacral region: Secondary | ICD-10-CM | POA: Diagnosis present

## 2015-07-07 DIAGNOSIS — G8114 Spastic hemiplegia affecting left nondominant side: Secondary | ICD-10-CM

## 2015-07-07 MED ORDER — OXYCODONE-ACETAMINOPHEN 10-325 MG PO TABS: 1.0000 | ORAL_TABLET | Freq: Three times a day (TID) | ORAL | Status: DC | PRN

## 2015-07-07 NOTE — Progress Notes (Signed)
Subjective:    Patient ID: [name], female    DOB: 07/02/1966, 50 y.o.   MRN: [id number]  HPI   [name] is here in follow up of her spastic left hemiparesis.   [name] has been to wound center today and her BP is elevated.  She feels it is related to pain. The wound care center is debriding her left first toe wound.   The last time I saw her we performed botox injections to her LUE. 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 It turned out that she had missed her morning keppra (which she tried to take as the seizure came on) and she had a UTI which apparently triggered the episode.  She feels that the botox has been helpful. She has had improved ROM in her left elbow and especially the hand. She is able to perform hygiene better and wear her splints more effectively.    Pain Inventory Average Pain 7 Pain Right Now 8 My pain is sharp, burning and aching  In the last 24 hours, has pain interfered with the following? General activity 7 Relation with others 5 Enjoyment of life 8 What TIME of day is your pain at its worst? daytime and night Sleep (in general) Fair  Pain is worse with: walking, bending, standing and some activites Pain improves with: medication Relief from Meds: 4  Mobility walk with assistance use a cane use a wheelchair needs help with transfers  Function disabled: date disabled .  Neuro/Psych bladder control problems trouble walking  Prior Studies Any changes since last visit?  no  Physicians involved in your care Any changes since last visit?  no   Family History  Problem Relation Age of Onset  . Heart disease Mother 67    Died of MI at age 61 yo  . Heart disease Paternal Grandmother     requiring pacemaker.  [name] Heart disease Paternal Grandfather 41    Died of MI at possibly age 21-53yo  . Stroke Paternal Grandfather   . Heart disease Father 59   MI age 43yo requiring stenting  . Kidney disease Mother     requiring dialysis  . Congestive Heart Failure Mother   . Diabetes Father   . Diabetes Brother   . Heart disease Brother 30    MI at age 85 years old  . Breast cancer Paternal Aunt   . Breast cancer Maternal Grandmother   . Glaucoma Father    History   Social History  . Marital Status: Single    Spouse Name: N/A  . Number of Children: 2  . Years of Education: 9th grade   Occupational History  . Unemployed     planning on getting disability   Social History Main Topics  . Smoking status: Never Smoker   . Smokeless tobacco: Never Used  . Alcohol Use: No  . Drug Use: No  . Sexual Activity:    Partners: Male    Birth Control/ Protection: Surgical   Other Topics Concern  . None   Social History Narrative   Lives in [place] with her son. Is able to read and write fluently in [place].   Past Surgical History  Procedure Laterality Date  . Tubal ligation  05/31/1985  . Carpal tunnel release Left   . Hemiarthroplasty shoulder fracture Right 1980's  . Multiple tooth extractions  ~ 2011    tumors removed ; "my  whole top"  . Cardiac catheterization  05/2011  . Tee without cardioversion N/A 01/14/2014    Procedure: TRANSESOPHAGEAL ECHOCARDIOGRAM (TEE);  Surgeon: Thurmon Fair, MD;  Location: [facility] ENDOSCOPY;  Service: Cardiovascular;  Laterality: N/A;  . Breast surgery Bilateral 2011    patient reports benign results  . Bi-ventricular implantable cardioverter defibrillator  (crt-d)  08/2013    [name] 08/23/2013  . Bi-ventricular implantable cardioverter defibrillator N/A 08/22/2013    Procedure: BI-VENTRICULAR IMPLANTABLE CARDIOVERTER DEFIBRILLATOR  (CRT-D);  Surgeon: [name], MD;  Location: [facility] CATH LAB;  Service: Cardiovascular;  Laterality: N/A;   Past Medical History  Diagnosis Date  . Hypertension     Poorly controlled. Has had HTN since age 89. Angioedema with ACEI.  24 Hr urine and renal arterial dopplers  ordered . . . Never done  . NICM (nonischemic cardiomyopathy)     EF 45-50% in 8/12, cath 6/12 showed normal coronaries, EF 50-55% by LV gram  . Morbid obesity   . Allergic rhinitis   . HLD (hyperlipidemia)   . OSA on CPAP     sleep study in 8/12 showed moderate to severe OSA requiring CPAP  . Chronic lower back pain     secondary to DJD, obsetiy, hip problems. Followed by Dr. Ivory Broad (pain management)  . Chronic diastolic heart failure      Primarily diastolic CHF: Likely due to uncontrolled HTN. Last echo (8/12) with EF 45-50%, mild to moderate LVH with some asymmetric septal hypertrophy, RV normal size and systolic function. EF 50-55% by LV-gram in 6/12.   . Polyneuropathy in diabetes(357.2)   . Thoracic or lumbosacral neuritis or radiculitis, unspecified   . Calcifying tendinitis of shoulder   . LBBB (left bundle branch block)   . Chronic combined systolic and diastolic CHF (congestive heart failure)     EF 40-45% by echo 12/06/2012  . Coronary artery disease     questionable. LHC 05/2011 showing normal coronaries // Followed at [facility], Dr. Shirlee Latch  . GERD (gastroesophageal reflux disease)   . Shortness of breath   . Asthma   . Automatic implantable cardioverter-defibrillator in situ   . Stroke 12/2013    "my left side is paralyzed" (07/04/2014)  . Type II diabetes mellitus DX: 2002  . 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 Degeneration of lumbar or lumbosacral intervertebral disc   . Arthritis     "hips, back, legs, arms" (07/04/2014)  . Frequent UTI   . Liver disease    BP 181/115 mmHg  Pulse 104  Resp 16  SpO2 98%  LMP 06/24/2015  Opioid Risk Score:   Fall Risk Score:  `1  Depression screen PHQ 2/9  Depression screen [facility] 2/9 07/07/2015 06/30/2015 06/11/2015 04/22/2015 03/12/2015 02/17/2015 01/14/2015  Decreased Interest 0 0 0 0 0 0 1  Down, Depressed, Hopeless 0 0 0 0 1 0 1  PHQ - 2 Score 0 0 0 0 1 0 2  Altered sleeping - - - - 1 - 1    Tired, decreased energy - - - - 1 - 1  Change in appetite - - - - 1 - 3  Feeling bad or failure about yourself  - - - - 1 - 0  Trouble concentrating - - - - 1 - 3  Moving slowly or fidgety/restless - - - - 1 - 0  Suicidal thoughts - - - - 0 - 0  PHQ-9 Score - - - -  7 - 10     Review of Systems  Cardiovascular: Positive for leg swelling.  Gastrointestinal: Positive for nausea and constipation.  Genitourinary:       Bladder control problems  Musculoskeletal: Positive for gait problem.       Foot pain  All other systems reviewed and are negative.      Objective:   Physical Exam  Constitutional: She is oriented to person, place, and time. She appears well-developed and well-nourished.  Obese --no change  HENT:  Head: Normocephalic and atraumatic.  Nose: Nose normal.  Eyes: Conjunctivae and EOM are normal. Pupils are equal, round, and reactive to light.  Neck: Normal range of motion.  Cardiovascular: Normal rate and regular rhythm. Defib/pacer left upper chest wall, site clean  Pulmonary/Chest: Effort normal and breath sounds normal.  Abdominal: She exhibits no distension. There is no tenderness.  Musculoskeletal: Patient had pain with palpation over the righ tlower lumbar paraspinals and upper pelvis. Hip was non-tender. Transferring was improved.  Trace to 1+ edema left lower ext. 1+ LUE.  Mild joint line tenderness bilaterally. She has bilateral valgus deformities at each knee, right more than left. she works hard to control the left knee and does a better job avoiding recurvatum.   Neurological: She is alert and oriented to person, place, and time. Left upper trace at bicep and deltoid. 0/5 elsewhere in UE. Trace movement proximal LLE. Tone 1+/4 bicep trace at pec and 1+/4 wrist/fingers, 1+ gastroc, TP, tib anterior trace. DTR's 3+ on left. Left central 7 and tongue deviation. Speech minimally dysarthric. Intelligence normal. sensory deficit (stocking glove both  legs) is present.  Psychiatric: She has a normal mood and affect. Her behavior is normal. Judgment and thought content normal.   Assessment & Plan:   ASSESSMENT:  1. Chronic low back pain which is multifactorial. Recent MRI of the  back is notable for disc disease and facet hypertrophy. Most  involved level appeared to be L4-L5, where there is a shallow broad-  based disk herniation and facet arthropathy with mild stenosis.  2. Diabetic peripheral neuropathy.  3. Bilateral rotator cuff syndrome.  4. 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 5. Cardiomyopathy/HTN.  6. 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  7. Morbid obesity  8. 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 Continue with ROM LUE, splint wear.  2. Percocet 10/325 was refilled one q8 prn. #90   3. Weight loss.   4. Keppra for seizure prophylaxis. Discussed the importance of compliance and vigilance to her health conditions.  5. Cardiac mgt per Dr. Orion Crook  6. Continue Voltaren gel for knees/ hands.   7. Will repeat botox in 2 months to left biceps, brachio radialis and finger/wrist flexors  8. I will see her back in about a month for said injections. 30 minutes of face to face patient care time were spent during this visit. All questions were encouraged and answered.

## 2015-07-07 NOTE — Patient Instructions (Signed)
CONTINUE TO STRETCH DAILY!!

## 2015-07-07 NOTE — Progress Notes (Signed)
Internal Medicine Clinic Attending  Case discussed with Dr. Wilson soon after the resident saw the patient.  We reviewed the resident's history and exam and pertinent patient test results.  I agree with the assessment, diagnosis, and plan of care documented in the resident's note.  

## 2015-07-08 DIAGNOSIS — M069 Rheumatoid arthritis, unspecified: Secondary | ICD-10-CM | POA: Diagnosis not present

## 2015-07-08 DIAGNOSIS — S91302A Unspecified open wound, left foot, initial encounter: Secondary | ICD-10-CM | POA: Diagnosis not present

## 2015-07-08 DIAGNOSIS — I2510 Atherosclerotic heart disease of native coronary artery without angina pectoris: Secondary | ICD-10-CM | POA: Diagnosis not present

## 2015-07-08 DIAGNOSIS — I509 Heart failure, unspecified: Secondary | ICD-10-CM | POA: Diagnosis not present

## 2015-07-15 ENCOUNTER — Encounter (HOSPITAL_BASED_OUTPATIENT_CLINIC_OR_DEPARTMENT_OTHER): Payer: Medicaid Other | Attending: General Surgery

## 2015-07-15 ENCOUNTER — Ambulatory Visit (HOSPITAL_COMMUNITY)
Admission: RE | Admit: 2015-07-15 | Discharge: 2015-07-15 | Disposition: A | Discharge status: Home / Self Care | Payer: Medicaid Other | Source: Ambulatory Visit | Attending: Cardiology | Admitting: Cardiology

## 2015-07-15 ENCOUNTER — Encounter (HOSPITAL_COMMUNITY): Payer: Self-pay

## 2015-07-15 VITALS — BP 162/100 | HR 98 | Wt 274.1 lb

## 2015-07-15 DIAGNOSIS — I2510 Atherosclerotic heart disease of native coronary artery without angina pectoris: Secondary | ICD-10-CM | POA: Diagnosis not present

## 2015-07-15 DIAGNOSIS — E1140 Type 2 diabetes mellitus with diabetic neuropathy, unspecified: Secondary | ICD-10-CM | POA: Diagnosis not present

## 2015-07-15 DIAGNOSIS — M549 Dorsalgia, unspecified: Secondary | ICD-10-CM | POA: Diagnosis not present

## 2015-07-15 DIAGNOSIS — E1142 Type 2 diabetes mellitus with diabetic polyneuropathy: Secondary | ICD-10-CM | POA: Insufficient documentation

## 2015-07-15 DIAGNOSIS — Z8673 Personal history of transient ischemic attack (TIA), and cerebral infarction without residual deficits: Secondary | ICD-10-CM | POA: Insufficient documentation

## 2015-07-15 DIAGNOSIS — I25119 Atherosclerotic heart disease of native coronary artery with unspecified angina pectoris: Secondary | ICD-10-CM | POA: Insufficient documentation

## 2015-07-15 DIAGNOSIS — G8929 Other chronic pain: Secondary | ICD-10-CM | POA: Diagnosis not present

## 2015-07-15 DIAGNOSIS — Z794 Long term (current) use of insulin: Secondary | ICD-10-CM | POA: Insufficient documentation

## 2015-07-15 DIAGNOSIS — J45909 Unspecified asthma, uncomplicated: Secondary | ICD-10-CM | POA: Diagnosis not present

## 2015-07-15 DIAGNOSIS — G40909 Epilepsy, unspecified, not intractable, without status epilepticus: Secondary | ICD-10-CM | POA: Insufficient documentation

## 2015-07-15 DIAGNOSIS — I5022 Chronic systolic (congestive) heart failure: Secondary | ICD-10-CM | POA: Insufficient documentation

## 2015-07-15 DIAGNOSIS — S91302A Unspecified open wound, left foot, initial encounter: Secondary | ICD-10-CM | POA: Diagnosis present

## 2015-07-15 DIAGNOSIS — K219 Gastro-esophageal reflux disease without esophagitis: Secondary | ICD-10-CM | POA: Insufficient documentation

## 2015-07-15 DIAGNOSIS — E785 Hyperlipidemia, unspecified: Secondary | ICD-10-CM | POA: Diagnosis not present

## 2015-07-15 DIAGNOSIS — I429 Cardiomyopathy, unspecified: Secondary | ICD-10-CM | POA: Diagnosis not present

## 2015-07-15 DIAGNOSIS — I10 Essential (primary) hypertension: Secondary | ICD-10-CM | POA: Insufficient documentation

## 2015-07-15 DIAGNOSIS — X58XXXA Exposure to other specified factors, initial encounter: Secondary | ICD-10-CM | POA: Insufficient documentation

## 2015-07-15 DIAGNOSIS — I5042 Chronic combined systolic (congestive) and diastolic (congestive) heart failure: Secondary | ICD-10-CM

## 2015-07-15 DIAGNOSIS — Z7982 Long term (current) use of aspirin: Secondary | ICD-10-CM | POA: Insufficient documentation

## 2015-07-15 DIAGNOSIS — E1149 Type 2 diabetes mellitus with other diabetic neurological complication: Secondary | ICD-10-CM | POA: Diagnosis not present

## 2015-07-15 DIAGNOSIS — I509 Heart failure, unspecified: Secondary | ICD-10-CM | POA: Insufficient documentation

## 2015-07-15 DIAGNOSIS — Z79899 Other long term (current) drug therapy: Secondary | ICD-10-CM | POA: Diagnosis not present

## 2015-07-15 DIAGNOSIS — G4733 Obstructive sleep apnea (adult) (pediatric): Secondary | ICD-10-CM | POA: Insufficient documentation

## 2015-07-15 DIAGNOSIS — M069 Rheumatoid arthritis, unspecified: Secondary | ICD-10-CM | POA: Diagnosis not present

## 2015-07-15 MED ORDER — FUROSEMIDE 40 MG PO TABS: 40.0000 mg | ORAL_TABLET | ORAL | Status: DC

## 2015-07-15 MED ORDER — AMLODIPINE BESYLATE 10 MG PO TABS: 10.0000 mg | ORAL_TABLET | Freq: Every day | ORAL | Status: DC

## 2015-07-15 MED ORDER — DOCUSATE SODIUM 50 MG PO CAPS: 50.0000 mg | ORAL_CAPSULE | Freq: Two times a day (BID) | ORAL | Status: DC | PRN

## 2015-07-15 NOTE — Patient Instructions (Signed)
INCREASE Amlodipine to 10 mg once daily.  CHANGE Lasix to 40mg  TWICE WEEKLY (Mondays & Fridays).  Take Colace 50mg  capsule twice daily as needed for constipation.  Follow up 6 weeks.  Do the following things EVERYDAY: 1) Weigh yourself in the morning before breakfast. Write it down and keep it in a log. 2) Take your medicines as prescribed 3) Eat low salt foods-Limit salt (sodium) to 2000 mg per day.  4) Stay as active as you can everyday 5) Limit all fluids for the day to less than 2 liters

## 2015-07-15 NOTE — Progress Notes (Signed)
Patient ID: [name], female   DOB: 29-Mar-1966, 50 y.o.   MRN: [id number]  EP: Dr Caryl Comes PCP: Dr Areta [facility] Primary HF Cardiologist: Dr Aundra Dubin   Angioedema with ACE-I  --Optivol--  HPI: 49 yo with history of morbid obesity, HTN, HLD, CAD, DM2, OSA on CPAP, CVA and combined CHF in the setting of poorly-controlled HTN. She was admitted in 05/2011 with chest pain and new LBBB and ended up getting a left heart cath, which showed no angiographic CAD. Renal artery dopplers were normal in 04/2012.  She has a Medtronic CRT-D device.   Was hospitalized May 2014 with acute on chronic combined systolic and diastolic CHF. EF from December 2013 was down to 40 to 45%.   Admitted in January 2015 with slurred speech, L arm weakness and facial droop. Initial head CT showed no acute abnormality. Repeat head CT showed subtle gray white matter differentiation in R frontal lobe and R operculum perhaps representing ischemic R sided MCA. STAT repeat CT head later on 1/29 showed further evolution of R MCA territory infarct involving R insular cortex and operculum. No hemorrhage. Neurology recommended CTA head/neck, which showed occlusion of MCA branch to the area of R MCA infarct and a normal neck CTA. TEE was done and did not reveal embolic source. Was discharged to inpatient rehab.  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  She was started on Keppra.  She went to the ER in 2/16 with syncope that was probably due to seizure, no arrhythmias with device interrogation.  Today she returns for HF follow up. Since the last visit evaluated in [facility] ED with increased dyspnea and CP. Later sent home with BB increased due to tachycardia. Denies SOB/PND/Orthopnea. Using CPAP intermittently. Has health care provider daily. Thinks she is taking meds correctly.  Has not had lasix in a month.     ECHO 11/2012: 40-45% ECHO  07/2013: 20% RV normal ECHO    08/23/2013: EF  40-45%, diff HK ECHO   09/05/2013: EF 25-30%, diff HK  ECHO  12/11/13: EF 30%, diff HK, RV normal ECHO 01/14/2014: EF 35-40%, LA mod dilated  ECHO 4/16: EF 40-45%, moderate LVH, normal RV size with mildly decreased systolic function.    Labs 06/2013: proBNP 230, Cr 0.9, K 3.7          08/2013: K+ 3.9, Cr 0.9       09/2013: K 4.2, Cr 0.86, pro-BNP 718, dig level <0.3     10/07/13: K+ 4.1, Cr 0.83       05/08/14: K 4.0 Creatinine 0.67 Pro BNP 233 Dig level 0.3        08/20/2014 K 3.8 nCreatinine 0.56        2/16: K 3.6, creatinine 0.95, LDL 106, HDL 43       3/16: K 3.5, creatinine 0.87, HCT 35.9       7/16: K 3.5, creatinine 0.78. HCT 37.1  ROS: All systems negative except as listed in HPI, PMH and Problem List.  Past Medical History  Diagnosis Date  . Hypertension     Poorly controlled. Has had HTN since age 2. Angioedema with ACEI.  24 Hr urine and renal arterial dopplers ordered . . . Never done  . NICM (nonischemic cardiomyopathy)     EF 45-50% in 8/12, cath 6/12 showed normal coronaries, EF 50-55% by LV gram  . Morbid obesity   . Allergic rhinitis   . HLD (  hyperlipidemia)   . OSA on CPAP     sleep study in 8/12 showed moderate to severe OSA requiring CPAP  . Chronic lower back pain     secondary to DJD, obsetiy, hip problems. Followed by Dr. Oval Linsey (pain management)  . Chronic diastolic heart failure      Primarily diastolic CHF: Likely due to uncontrolled HTN. Last echo (8/12) with EF 45-50%, mild to moderate LVH with some asymmetric septal hypertrophy, RV normal size and systolic function. EF 50-55% by LV-gram in 6/12.   . Polyneuropathy in diabetes(357.2)   . Thoracic or lumbosacral neuritis or radiculitis, unspecified   . Calcifying tendinitis of shoulder   . LBBB (left bundle branch block)   . Chronic combined systolic and diastolic CHF (congestive heart failure)     EF 40-45% by echo 12/06/2012  . Coronary artery disease     questionable. LHC 05/2011 showing normal  coronaries // Followed at [facility], Dr. Aundra Dubin  . GERD (gastroesophageal reflux disease)   . Shortness of breath   . Asthma   . Automatic implantable cardioverter-defibrillator in situ   . Stroke 12/2013    "my left side is paralyzed" (07/04/2014)  . Type II diabetes mellitus DX: 2002  . 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 Degeneration of lumbar or lumbosacral intervertebral disc   . Arthritis     "hips, back, legs, arms" (07/04/2014)  . Frequent UTI   . Liver disease     Current Outpatient Prescriptions  Medication Sig Dispense Refill  . ACCU-CHEK FASTCLIX LANCETS MISC Use 1-2 times daily when checking blood sugar. ICD10: E11.9 102 each 11  . amLODipine (NORVASC) 5 MG tablet Take 1 tablet (5 mg total) by mouth daily. 30 tablet 6  . aspirin 325 MG tablet Take 1 tablet (325 mg total) by mouth daily. 30 tablet 5  . Blood Glucose Monitoring Suppl (ACCU-CHEK AVIVA PLUS) W/DEVICE KIT 1 each by Does not apply route 3 (three) times daily. 1 kit 0  . citalopram (CELEXA) 20 MG tablet Take 1 tablet (20 mg total) by mouth daily. 30 tablet 5  . cyclobenzaprine (FLEXERIL) 10 MG tablet Take 1 tablet (10 mg total) by mouth 2 (two) times daily as needed for muscle spasms. 30 tablet 1  . diclofenac sodium (VOLTAREN) 1 % GEL Apply 2 g topically 4 (four) times daily as needed (pain). 3 Tube 5  . digoxin (LANOXIN) 0.125 MG tablet Take 1 tablet (0.125 mg total) by mouth daily. 30 tablet 5  . fluticasone (CUTIVATE) 0.05 % cream Apply topically 2 (two) times daily. Dispense two tubes 30 g 6  . glucose blood (ACCU-CHEK SMARTVIEW) test strip Use to Check Blood Sugar TID. Code: E11.40. 100 each 12  . hydrALAZINE (APRESOLINE) 50 MG tablet Take 1.5 tablets (75 mg total) by mouth 3 (three) times daily. 270 tablet 3  . ibuprofen (ADVIL,MOTRIN) 200 MG tablet Take 200 mg by mouth every 6 (six) hours as needed for moderate pain.    . Incontinence Supplies MISC 1 year supply of Pull-ups in  size XL Diagnosis Code: 788.33 1 each 0  . Insulin Glargine (LANTUS SOLOSTAR) 100 UNIT/ML Solostar Pen Inject 10 units in the AM, 35 units in the PM (Patient taking differently: Inject 10-35 Units into the skin See admin instructions. Inject 10 units in the AM, 35 units in the PM) 15 mL 11  . Insulin Pen Needle (GLOBAL EASE INJECT PEN NEEDLES) 31G X 5  MM MISC Use to inject insulin BID. Code: E 11.40 100 each 10  . ipratropium (ATROVENT) 0.06 % nasal spray Place 2 sprays into both nostrils 4 (four) times daily. 15 mL 1  . levETIRAcetam (KEPPRA) 500 MG tablet Take 1 tablet (500 mg total) by mouth 2 (two) times daily. 60 tablet 3  . metFORMIN (GLUCOPHAGE) 1000 MG tablet Take 1 tablet (1,000 mg total) by mouth daily with breakfast. 90 tablet 3  . metoprolol succinate (TOPROL-XL) 100 MG 24 hr tablet Take 1 tablet (100 mg total) by mouth 2 (two) times daily. 60 tablet 0  . nystatin (MYCOSTATIN/NYSTOP) 100000 UNIT/GM POWD Apply to affected area twice daily until healed. 30 g 0  . oxyCODONE-acetaminophen (PERCOCET) 10-325 MG per tablet Take 1 tablet by mouth every 8 (eight) hours as needed for pain. 90 tablet 0  . pantoprazole (PROTONIX) 40 MG tablet Take 40 mg by mouth daily.    . potassium chloride 20 MEQ TBCR Take 20 mEq by mouth daily. 90 tablet 3  . simvastatin (ZOCOR) 40 MG tablet Take 40 mg by mouth at bedtime.     . solifenacin (VESICARE) 10 MG tablet Take 10 mg by mouth daily.    . Vitamin D, Ergocalciferol, (DRISDOL) 50000 UNITS CAPS capsule Take 1 capsule (50,000 Units total) by mouth every 7 (seven) days. (Patient taking differently: Take 50,000 Units by mouth every Monday. ) 24 capsule 0  . furosemide (LASIX) 40 MG tablet Take 1 tablet (40 mg total) by mouth daily. (Patient not taking: Reported on 07/15/2015) 90 tablet 3   No current facility-administered medications for this encounter.   Filed Vitals:   07/15/15 1029  BP: 162/100  Pulse: 98  Weight: 274 lb 2 oz (124.342 kg)  SpO2: 98%    PHYSICAL EXAM: General:  NAD. No resp difficulty; Neck: supple. Thick. JVP 7 cm. Carotids 2+ bilaterally; no bruits. No lymphadenopathy appreciated.  Cor: PMI nonpalpable; Regular rate & rhythm. No rubs, gallops or murmurs. No S3/S4.  Lungs: clear Abdomen: obese soft, nontender, mildly distended. No hepatosplenomegaly. No bruits or masses. Good bowel sounds. Extremities: no cyanosis, clubbing, rash, trace ankle edema.  Neuro: alert & orientedx3, cranial nerves grossly intact. Moves all 4 extremities w/o difficulty. Affect pleasant.  ASSESSMENT: 1. Chronic systolic HF: Nonischemic cardiomyopathy, normal coronaries on 6/12 angiography.CMP may be due to HTN.  Echo 03/2014 EF 40-45%.  S/P CRT-D Medtronic.   NYHA class II symptoms, volume status stable off lasix for 1 month. I have asked her to take lasix 40 mg twice weekly. Provided scale with weight chart.   - Continue current dose of toprol xl. Continue hydralazine 75 mg tid, continue Imdur 60 daily.  - Continue digoxin - No  ACE-I due to angioedema.  Will consider ARB in future, risk of cross-reaction for angioedema is not high.  - Reinforced the need and importance of daily weights, a low sodium diet, and fluid restriction (less than 2 L a day). Instructed to call the HF clinic if weight increases more than 3 lbs overnight or 5 lbs in a week.  2. HTN: BP elevated today. Increase amlodipine to 10 mg daily. Continue current dose of Toprol XL, hydralazine, imdur.  3. Morbid obesity:  Encouraged weight loss with portion control.   4. OSA on CPAP: Uses intermittently. Encouraged to use nightly.  5. CVA: .  Continue ASA, statin.     Concern for medication compliance and regimen at home. Refer to paramedicine.  Follow up in 6 weeks.  CLEGG,AMY NP-C  07/15/2015

## 2015-07-16 ENCOUNTER — Other Ambulatory Visit: Payer: Self-pay

## 2015-07-16 ENCOUNTER — Other Ambulatory Visit (HOSPITAL_COMMUNITY): Payer: Self-pay | Admitting: *Deleted

## 2015-07-16 ENCOUNTER — Other Ambulatory Visit: Payer: Self-pay | Admitting: *Deleted

## 2015-07-16 DIAGNOSIS — E1140 Type 2 diabetes mellitus with diabetic neuropathy, unspecified: Secondary | ICD-10-CM

## 2015-07-16 DIAGNOSIS — IMO0002 Reserved for concepts with insufficient information to code with codable children: Secondary | ICD-10-CM

## 2015-07-16 DIAGNOSIS — E1165 Type 2 diabetes mellitus with hyperglycemia: Principal | ICD-10-CM

## 2015-07-16 MED ORDER — AMLODIPINE BESYLATE 10 MG PO TABS: 10.0000 mg | ORAL_TABLET | Freq: Every day | ORAL | Status: DC

## 2015-07-16 MED ORDER — ASPIRIN 325 MG PO TABS: 325.0000 mg | ORAL_TABLET | Freq: Every day | ORAL | Status: DC

## 2015-07-16 MED ORDER — LEVETIRACETAM 500 MG PO TABS: 500.0000 mg | ORAL_TABLET | Freq: Two times a day (BID) | ORAL | Status: DC

## 2015-07-16 MED ORDER — POTASSIUM CHLORIDE ER 20 MEQ PO TBCR: 20.0000 meq | EXTENDED_RELEASE_TABLET | Freq: Every day | ORAL | Status: DC

## 2015-07-22 ENCOUNTER — Other Ambulatory Visit (HOSPITAL_BASED_OUTPATIENT_CLINIC_OR_DEPARTMENT_OTHER): Payer: Self-pay | Admitting: General Surgery

## 2015-07-22 DIAGNOSIS — E1149 Type 2 diabetes mellitus with other diabetic neurological complication: Secondary | ICD-10-CM | POA: Diagnosis not present

## 2015-07-22 DIAGNOSIS — E1140 Type 2 diabetes mellitus with diabetic neuropathy, unspecified: Secondary | ICD-10-CM | POA: Diagnosis not present

## 2015-07-22 DIAGNOSIS — Z794 Long term (current) use of insulin: Secondary | ICD-10-CM | POA: Diagnosis not present

## 2015-07-22 DIAGNOSIS — S91302A Unspecified open wound, left foot, initial encounter: Secondary | ICD-10-CM | POA: Diagnosis not present

## 2015-07-23 ENCOUNTER — Ambulatory Visit: Payer: No Typology Code available for payment source | Attending: Registered Nurse | Admitting: Physical Therapy

## 2015-07-23 ENCOUNTER — Telehealth: Payer: Self-pay | Admitting: *Deleted

## 2015-07-23 DIAGNOSIS — I63311 Cerebral infarction due to thrombosis of right middle cerebral artery: Secondary | ICD-10-CM | POA: Insufficient documentation

## 2015-07-23 NOTE — Therapy (Signed)
[facility] [address] [place], [place], [address] Phone: [phone]   Fax:  [phone]  Patient Details  Name: [name] MRN: [id number] Date of Birth: 05-09-1966 Referring Provider:  Jones Bales, NP  Encounter Date: 07/23/2015   Patient arrived to session expressing that primary concern is regaining LUE function after Botox injection in LUE. Pt reporting having had Botox injection in LUE in May and plans to have another in November. Educated pt on financial limitations of insurance, explaining that pt would likely receive only 1 therapy evaluation and 3 total visits per calendar year. Explained to pt the importance of receiving OT about 3 weeks after Botox injection to maximize functional UE return. When pt given the option as to what therapy to pursue, pt expressed that she would like to have OT after next Botox injection.  Therefore, PT evaluation was not performed and pt will not be seen for outpatient PT at this time. Pt verbalized understanding and was in full agreement.  Jorje Guild, PT, DPT [facility] [address] [place], [place], [address] Phone: [phone]   Fax:  [phone] 07/23/2015, 9:27 AM Phone: [phone]   Fax:  [phone]

## 2015-07-23 NOTE — Telephone Encounter (Signed)
Patient her this am asking about the process of her request for Diabetic Shoes.  Unable to locate forms that patient brought to Clinics for Dr. Yetta Barre to complete.  Will ask Chilon on Monday about process.  Call to [facility].  Office was unable to locate an order for the Diabetic Shoes.  Patient when here said that she will call back on Monday to speak with Chilon.  Patient also requested an order for addition PT appointments.  Request was sent to Dr. Yetta Barre for additional orders.  Angelina Ok, RN 07/23/2015 10:44 AM

## 2015-07-29 DIAGNOSIS — E1149 Type 2 diabetes mellitus with other diabetic neurological complication: Secondary | ICD-10-CM | POA: Diagnosis not present

## 2015-07-29 DIAGNOSIS — E1140 Type 2 diabetes mellitus with diabetic neuropathy, unspecified: Secondary | ICD-10-CM | POA: Diagnosis not present

## 2015-07-29 DIAGNOSIS — S91302A Unspecified open wound, left foot, initial encounter: Secondary | ICD-10-CM | POA: Diagnosis not present

## 2015-07-29 DIAGNOSIS — Z794 Long term (current) use of insulin: Secondary | ICD-10-CM | POA: Diagnosis not present

## 2015-07-30 ENCOUNTER — Ambulatory Visit (HOSPITAL_COMMUNITY)
Admission: RE | Admit: 2015-07-30 | Discharge: 2015-07-30 | Disposition: A | Discharge status: Home / Self Care | Payer: Medicaid Other | Source: Ambulatory Visit | Attending: General Surgery | Admitting: General Surgery

## 2015-07-30 DIAGNOSIS — S91302A Unspecified open wound, left foot, initial encounter: Secondary | ICD-10-CM | POA: Insufficient documentation

## 2015-07-30 DIAGNOSIS — L03116 Cellulitis of left lower limb: Secondary | ICD-10-CM | POA: Insufficient documentation

## 2015-07-30 DIAGNOSIS — X58XXXA Exposure to other specified factors, initial encounter: Secondary | ICD-10-CM | POA: Insufficient documentation

## 2015-07-30 DIAGNOSIS — M795 Residual foreign body in soft tissue: Secondary | ICD-10-CM | POA: Insufficient documentation

## 2015-08-04 ENCOUNTER — Encounter: Payer: Medicaid Other | Attending: Physical Medicine & Rehabilitation | Admitting: Registered Nurse

## 2015-08-04 ENCOUNTER — Encounter: Payer: Self-pay | Admitting: Registered Nurse

## 2015-08-04 VITALS — BP 190/107 | HR 127

## 2015-08-04 DIAGNOSIS — I5042 Chronic combined systolic (congestive) and diastolic (congestive) heart failure: Secondary | ICD-10-CM | POA: Insufficient documentation

## 2015-08-04 DIAGNOSIS — E1342 Other specified diabetes mellitus with diabetic polyneuropathy: Secondary | ICD-10-CM | POA: Diagnosis not present

## 2015-08-04 DIAGNOSIS — E1142 Type 2 diabetes mellitus with diabetic polyneuropathy: Secondary | ICD-10-CM

## 2015-08-04 DIAGNOSIS — G8110 Spastic hemiplegia affecting unspecified side: Secondary | ICD-10-CM

## 2015-08-04 DIAGNOSIS — M47817 Spondylosis without myelopathy or radiculopathy, lumbosacral region: Secondary | ICD-10-CM | POA: Diagnosis present

## 2015-08-04 DIAGNOSIS — Z5181 Encounter for therapeutic drug level monitoring: Secondary | ICD-10-CM | POA: Insufficient documentation

## 2015-08-04 DIAGNOSIS — I63319 Cerebral infarction due to thrombosis of unspecified middle cerebral artery: Secondary | ICD-10-CM | POA: Diagnosis not present

## 2015-08-04 DIAGNOSIS — Z79899 Other long term (current) drug therapy: Secondary | ICD-10-CM | POA: Diagnosis present

## 2015-08-04 DIAGNOSIS — G629 Polyneuropathy, unspecified: Secondary | ICD-10-CM | POA: Diagnosis not present

## 2015-08-04 DIAGNOSIS — G8114 Spastic hemiplegia affecting left nondominant side: Secondary | ICD-10-CM

## 2015-08-04 MED ORDER — OXYCODONE-ACETAMINOPHEN 10-325 MG PO TABS: 1.0000 | ORAL_TABLET | Freq: Three times a day (TID) | ORAL | Status: DC | PRN

## 2015-08-04 NOTE — Progress Notes (Signed)
Subjective:    Patient ID: [name], female    DOB: 01-Feb-1966, 49 y.o.   MRN: [id number]  HPI: [name] is a 49 year old female who returns for follow up for chronic pain and medication refill. She says her pain is located in her rightt arm, left leg and lower back. She rates her pain 8. Her current exercise regime is performing stretching exercises daily and walking with her 4 prong cane. She arrived in her wheelchair. Walked in the room walking with steppage gait. Arrived to office hypertensive she missed her Norvasc educated on medication compliance she verbalizes understanding. Blood re-checked 120/105 she refuses ED. Also states her first cousin was killed in a MVA last week.  She will be scheduled for Botox in October due to scheduling she verbalizes understanding.   Pain Inventory Average Pain 8 Pain Right Now 8 My pain is na  In the last 24 hours, has pain interfered with the following? General activity 9 Relation with others 9 Enjoyment of life 9 What TIME of day is your pain at its worst? na Sleep (in general) Poor  Pain is worse with: walking, sitting, inactivity, standing and some activites Pain improves with: rest and medication Relief from Meds: na  Mobility use a cane use a walker ability to climb steps?  no do you drive?  yes use a wheelchair Do you have any goals in this area?  yes  Function I need assistance with the following:  meal prep and shopping  Neuro/Psych bladder control problems numbness tingling trouble walking spasms dizziness  Prior Studies na  Physicians involved in your care na   Family History  Problem Relation Age of Onset  . Heart disease Mother 67    Died of MI at age 16 yo  . Heart disease Paternal Grandmother     requiring pacemaker.  [name] Heart disease Paternal Grandfather 67    Died of MI at possibly age 37-53yo  . Stroke Paternal Grandfather   . Heart disease Father 68    MI age 77yo requiring  stenting  . Kidney disease Mother     requiring dialysis  . Congestive Heart Failure Mother   . Diabetes Father   . Diabetes Brother   . Heart disease Brother 92    MI at age 49 years old  . Breast cancer Paternal Aunt   . Breast cancer Maternal Grandmother   . Glaucoma Father    Social History   Social History  . Marital Status: Single    Spouse Name: N/A  . Number of Children: 2  . Years of Education: 9th grade   Occupational History  . Unemployed     planning on getting disability   Social History Main Topics  . Smoking status: Never Smoker   . Smokeless tobacco: Never Used  . Alcohol Use: No  . Drug Use: No  . Sexual Activity:    Partners: Male    Birth Control/ Protection: Surgical   Other Topics Concern  . Not on file   Social History Narrative   Lives in [place] with her son. Is able to read and write fluently in [place].   Past Surgical History  Procedure Laterality Date  . Tubal ligation  05/31/1985  . Carpal tunnel release Left   . Hemiarthroplasty shoulder fracture Right 1980's  . Multiple tooth extractions  ~ 2011    tumors removed ; "my whole top"  . Cardiac catheterization  05/2011  .  Tee without cardioversion N/A 01/14/2014    Procedure: TRANSESOPHAGEAL ECHOCARDIOGRAM (TEE);  Surgeon: Thurmon Fair, MD;  Location: [facility] ENDOSCOPY;  Service: Cardiovascular;  Laterality: N/A;  . Breast surgery Bilateral 2011    patient reports benign results  . Bi-ventricular implantable cardioverter defibrillator  (crt-d)  08/2013    [name] 08/23/2013  . Bi-ventricular implantable cardioverter defibrillator N/A 08/22/2013    Procedure: BI-VENTRICULAR IMPLANTABLE CARDIOVERTER DEFIBRILLATOR  (CRT-D);  Surgeon: [name], MD;  Location: [facility] CATH LAB;  Service: Cardiovascular;  Laterality: N/A;   Past Medical History  Diagnosis Date  . Hypertension     Poorly controlled. Has had HTN since age 36. Angioedema with ACEI.  24 Hr urine and renal arterial dopplers ordered  . . . Never done  . NICM (nonischemic cardiomyopathy)     EF 45-50% in 8/12, cath 6/12 showed normal coronaries, EF 50-55% by LV gram  . Morbid obesity   . Allergic rhinitis   . HLD (hyperlipidemia)   . OSA on CPAP     sleep study in 8/12 showed moderate to severe OSA requiring CPAP  . Chronic lower back pain     secondary to DJD, obsetiy, hip problems. Followed by Dr. Ivory Broad (pain management)  . Chronic diastolic heart failure      Primarily diastolic CHF: Likely due to uncontrolled HTN. Last echo (8/12) with EF 45-50%, mild to moderate LVH with some asymmetric septal hypertrophy, RV normal size and systolic function. EF 50-55% by LV-gram in 6/12.   . Polyneuropathy in diabetes(357.2)   . Thoracic or lumbosacral neuritis or radiculitis, unspecified   . Calcifying tendinitis of shoulder   . LBBB (left bundle branch block)   . Chronic combined systolic and diastolic CHF (congestive heart failure)     EF 40-45% by echo 12/06/2012  . Coronary artery disease     questionable. LHC 05/2011 showing normal coronaries // Followed at [facility]iology, Dr. Shirlee Latch  . GERD (gastroesophageal reflux disease)   . Shortness of breath   . Asthma   . Automatic implantable cardioverter-defibrillator in situ   . Stroke 12/2013    "my left side is paralyzed" (07/04/2014)  . Type II diabetes mellitus DX: 2002  . 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 Degeneration of lumbar or lumbosacral intervertebral disc   . Arthritis     "hips, back, legs, arms" (07/04/2014)  . Frequent UTI   . Liver disease    BP 190/107 mmHg  Pulse 127  SpO2 97%  Opioid Risk Score:   Fall Risk Score:  `1  Depression screen PHQ 2/9  Depression screen [facility] 2/9 08/04/2015 07/07/2015 06/30/2015 06/11/2015 04/22/2015 03/12/2015 02/17/2015  Decreased Interest 0 0 0 0 0 0 0  Down, Depressed, Hopeless 0 0 0 0 0 1 0  PHQ - 2 Score 0 0 0 0 0 1 0  Altered sleeping - - - - - 1 -  Tired, decreased energy - - - - - 1  -  Change in appetite - - - - - 1 -  Feeling bad or failure about yourself  - - - - - 1 -  Trouble concentrating - - - - - 1 -  Moving slowly or fidgety/restless - - - - - 1 -  Suicidal thoughts - - - - - 0 -  PHQ-9 Score - - - - - 7 -      Review of Systems  Constitutional: Positive for unexpected weight  change.  Gastrointestinal: Positive for constipation.  Endocrine:       High blood sugar  Genitourinary: Positive for decreased urine volume.  All other systems reviewed and are negative.      Objective:   Physical Exam  Constitutional: She is oriented to person, place, and time. She appears well-developed and well-nourished.  HENT:  Head: Normocephalic and atraumatic.  Neck: Normal range of motion. Neck supple.  Cardiovascular: Normal rate and regular rhythm.   Pulmonary/Chest: Effort normal and breath sounds normal.  Musculoskeletal:  Normal Muscle Bulk and Muscle Testing Reveals: Right: Decreased ROM 90 Degrees and Muscle Strength 4/5 Left Hemiparesis Lumbar Paraspinal Tenderness: L-3-L-4 Lower Extremities: Right: Full ROM and Muscle Strength 5/5 Left: Paresis: Left AFO Steppage Gait using 4 Prong Cane Transfer to wheelchair   Neurological: She is alert and oriented to person, place, and time.  Skin: Skin is warm and dry.  Psychiatric: She has a normal mood and affect.  Nursing note and vitals reviewed.         Assessment & Plan:  1. Chronic low back pain: Continue Current Medication Regime. Refilled: oxyCODONE 10/325mg  one tablet every 8 hours as needed.#90.  2. Diabetic peripheral neuropathy: Continue to monitor  3. Recent Right MCA infarct: Left leg with spasms. Continue Baclofen..Botox injection with Dr. Riley Kill in October. 4. OA: Continue Voltaren gel   20 minutes of face to face patient care time was spent during this visit. All questions were encouraged and answered.  F/U in 1 month

## 2015-08-05 DIAGNOSIS — E1140 Type 2 diabetes mellitus with diabetic neuropathy, unspecified: Secondary | ICD-10-CM | POA: Diagnosis not present

## 2015-08-05 DIAGNOSIS — S91302A Unspecified open wound, left foot, initial encounter: Secondary | ICD-10-CM | POA: Diagnosis not present

## 2015-08-05 DIAGNOSIS — Z794 Long term (current) use of insulin: Secondary | ICD-10-CM | POA: Diagnosis not present

## 2015-08-05 DIAGNOSIS — E1149 Type 2 diabetes mellitus with other diabetic neurological complication: Secondary | ICD-10-CM | POA: Diagnosis not present

## 2015-08-12 DIAGNOSIS — S91302A Unspecified open wound, left foot, initial encounter: Secondary | ICD-10-CM | POA: Diagnosis not present

## 2015-08-12 DIAGNOSIS — Z794 Long term (current) use of insulin: Secondary | ICD-10-CM | POA: Diagnosis not present

## 2015-08-12 DIAGNOSIS — E1140 Type 2 diabetes mellitus with diabetic neuropathy, unspecified: Secondary | ICD-10-CM | POA: Diagnosis not present

## 2015-08-12 DIAGNOSIS — E1149 Type 2 diabetes mellitus with other diabetic neurological complication: Secondary | ICD-10-CM | POA: Diagnosis not present

## 2015-08-13 ENCOUNTER — Other Ambulatory Visit: Payer: Self-pay | Admitting: *Deleted

## 2015-08-14 MED ORDER — CYCLOBENZAPRINE HCL 10 MG PO TABS: 10.0000 mg | ORAL_TABLET | Freq: Two times a day (BID) | ORAL | Status: DC | PRN

## 2015-08-15 ENCOUNTER — Other Ambulatory Visit: Payer: Self-pay | Admitting: *Deleted

## 2015-08-19 ENCOUNTER — Encounter (HOSPITAL_BASED_OUTPATIENT_CLINIC_OR_DEPARTMENT_OTHER): Payer: Medicaid Other | Attending: General Surgery

## 2015-08-19 DIAGNOSIS — S91302A Unspecified open wound, left foot, initial encounter: Secondary | ICD-10-CM | POA: Insufficient documentation

## 2015-08-19 DIAGNOSIS — E1140 Type 2 diabetes mellitus with diabetic neuropathy, unspecified: Secondary | ICD-10-CM | POA: Insufficient documentation

## 2015-08-19 DIAGNOSIS — M069 Rheumatoid arthritis, unspecified: Secondary | ICD-10-CM | POA: Insufficient documentation

## 2015-08-19 DIAGNOSIS — G40909 Epilepsy, unspecified, not intractable, without status epilepticus: Secondary | ICD-10-CM | POA: Insufficient documentation

## 2015-08-19 DIAGNOSIS — X58XXXA Exposure to other specified factors, initial encounter: Secondary | ICD-10-CM | POA: Insufficient documentation

## 2015-08-19 DIAGNOSIS — I10 Essential (primary) hypertension: Secondary | ICD-10-CM | POA: Insufficient documentation

## 2015-08-19 DIAGNOSIS — E1149 Type 2 diabetes mellitus with other diabetic neurological complication: Secondary | ICD-10-CM | POA: Insufficient documentation

## 2015-08-19 DIAGNOSIS — I509 Heart failure, unspecified: Secondary | ICD-10-CM | POA: Insufficient documentation

## 2015-08-19 DIAGNOSIS — Z794 Long term (current) use of insulin: Secondary | ICD-10-CM | POA: Insufficient documentation

## 2015-08-19 DIAGNOSIS — I2510 Atherosclerotic heart disease of native coronary artery without angina pectoris: Secondary | ICD-10-CM | POA: Insufficient documentation

## 2015-08-19 NOTE — Telephone Encounter (Signed)
Called to pharm 

## 2015-08-19 NOTE — Telephone Encounter (Signed)
Follow-up vitamin D level was to be drawn about 6-9 months ago as she had not been taking the medication for some time anyway, but has yet to be done.  Will not renew vitamin D until documentation that continued therapy is still indicated.

## 2015-08-21 ENCOUNTER — Other Ambulatory Visit: Payer: Self-pay | Admitting: *Deleted

## 2015-08-22 NOTE — Telephone Encounter (Signed)
Patient needs to get another Vit D level checked before getting supplementation

## 2015-08-26 ENCOUNTER — Inpatient Hospital Stay (HOSPITAL_COMMUNITY): Admission: RE | Admit: 2015-08-26 | Payer: Medicaid Other | Source: Ambulatory Visit

## 2015-08-26 DIAGNOSIS — I509 Heart failure, unspecified: Secondary | ICD-10-CM | POA: Diagnosis not present

## 2015-08-26 DIAGNOSIS — X58XXXA Exposure to other specified factors, initial encounter: Secondary | ICD-10-CM | POA: Diagnosis not present

## 2015-08-26 DIAGNOSIS — S91302A Unspecified open wound, left foot, initial encounter: Secondary | ICD-10-CM | POA: Diagnosis present

## 2015-08-26 DIAGNOSIS — M069 Rheumatoid arthritis, unspecified: Secondary | ICD-10-CM | POA: Diagnosis not present

## 2015-08-26 DIAGNOSIS — I2510 Atherosclerotic heart disease of native coronary artery without angina pectoris: Secondary | ICD-10-CM | POA: Diagnosis not present

## 2015-08-26 DIAGNOSIS — Z794 Long term (current) use of insulin: Secondary | ICD-10-CM | POA: Diagnosis not present

## 2015-08-26 DIAGNOSIS — I10 Essential (primary) hypertension: Secondary | ICD-10-CM | POA: Diagnosis not present

## 2015-08-26 DIAGNOSIS — E1149 Type 2 diabetes mellitus with other diabetic neurological complication: Secondary | ICD-10-CM | POA: Diagnosis not present

## 2015-08-26 DIAGNOSIS — E1140 Type 2 diabetes mellitus with diabetic neuropathy, unspecified: Secondary | ICD-10-CM | POA: Diagnosis not present

## 2015-08-26 DIAGNOSIS — G40909 Epilepsy, unspecified, not intractable, without status epilepticus: Secondary | ICD-10-CM | POA: Diagnosis not present

## 2015-09-02 ENCOUNTER — Other Ambulatory Visit: Payer: Self-pay | Admitting: Registered Nurse

## 2015-09-02 ENCOUNTER — Encounter: Payer: Medicaid Other | Attending: Physical Medicine & Rehabilitation | Admitting: Registered Nurse

## 2015-09-02 ENCOUNTER — Encounter: Payer: Self-pay | Admitting: Registered Nurse

## 2015-09-02 VITALS — BP 169/99 | HR 114

## 2015-09-02 DIAGNOSIS — Z79899 Other long term (current) drug therapy: Secondary | ICD-10-CM | POA: Diagnosis present

## 2015-09-02 DIAGNOSIS — M47817 Spondylosis without myelopathy or radiculopathy, lumbosacral region: Secondary | ICD-10-CM | POA: Diagnosis present

## 2015-09-02 DIAGNOSIS — E1342 Other specified diabetes mellitus with diabetic polyneuropathy: Secondary | ICD-10-CM | POA: Diagnosis not present

## 2015-09-02 DIAGNOSIS — I63319 Cerebral infarction due to thrombosis of unspecified middle cerebral artery: Secondary | ICD-10-CM

## 2015-09-02 DIAGNOSIS — I5042 Chronic combined systolic (congestive) and diastolic (congestive) heart failure: Secondary | ICD-10-CM | POA: Diagnosis present

## 2015-09-02 DIAGNOSIS — Z5181 Encounter for therapeutic drug level monitoring: Secondary | ICD-10-CM

## 2015-09-02 DIAGNOSIS — G629 Polyneuropathy, unspecified: Secondary | ICD-10-CM | POA: Insufficient documentation

## 2015-09-02 DIAGNOSIS — Z87898 Personal history of other specified conditions: Secondary | ICD-10-CM

## 2015-09-02 DIAGNOSIS — Z8673 Personal history of transient ischemic attack (TIA), and cerebral infarction without residual deficits: Secondary | ICD-10-CM | POA: Diagnosis not present

## 2015-09-02 DIAGNOSIS — G8110 Spastic hemiplegia affecting unspecified side: Secondary | ICD-10-CM | POA: Insufficient documentation

## 2015-09-02 DIAGNOSIS — G8114 Spastic hemiplegia affecting left nondominant side: Secondary | ICD-10-CM

## 2015-09-02 DIAGNOSIS — Z8669 Personal history of other diseases of the nervous system and sense organs: Secondary | ICD-10-CM

## 2015-09-02 DIAGNOSIS — E1142 Type 2 diabetes mellitus with diabetic polyneuropathy: Secondary | ICD-10-CM

## 2015-09-02 MED ORDER — OXYCODONE-ACETAMINOPHEN 10-325 MG PO TABS: 1.0000 | ORAL_TABLET | Freq: Three times a day (TID) | ORAL | Status: DC | PRN

## 2015-09-02 NOTE — Progress Notes (Signed)
Subjective:    Patient ID: [name], female    DOB: 1966-10-23, 49 y.o.   MRN: [id number]  HPI: [name] is a 49 year old female who returns for follow up for chronic pain and medication refill. She says her pain is located in her right shoulder, lower back and left knee. She rates her pain 7. Her current exercise regime is performing stretching exercises daily and walking with her 4 prong cane. Walking  using 4 prong cane she has  a steppage gait. Arrived to office hypertensive blood pressure re-checked 169/99.   Pain Inventory Average Pain 6 Pain Right Now 7 My pain is intermittent, stabbing, tingling and aching  In the last 24 hours, has pain interfered with the following? General activity 7 Relation with others 6 Enjoyment of life 5 What TIME of day is your pain at its worst? na Sleep (in general) NA  Pain is worse with: na Pain improves with: na Relief from Meds: na  Mobility Do you have any goals in this area?  no  Function Do you have any goals in this area?  no  Neuro/Psych No problems in this area  Prior Studies na  Physicians involved in your care na   Family History  Problem Relation Age of Onset  . Heart disease Mother 11    Died of MI at age 80 yo  . Heart disease Paternal Grandmother     requiring pacemaker.  [name] Heart disease Paternal Grandfather 75    Died of MI at possibly age 79-53yo  . Stroke Paternal Grandfather   . Heart disease Father 61    MI age 26yo requiring stenting  . Kidney disease Mother     requiring dialysis  . Congestive Heart Failure Mother   . Diabetes Father   . Diabetes Brother   . Heart disease Brother 7    MI at age 66 years old  . Breast cancer Paternal Aunt   . Breast cancer Maternal Grandmother   . Glaucoma Father    Social History   Social History  . Marital Status: Single    Spouse Name: N/A  . Number of Children: 2  . Years of Education: 9th grade   Occupational History  .  Unemployed     planning on getting disability   Social History Main Topics  . Smoking status: Never Smoker   . Smokeless tobacco: Never Used  . Alcohol Use: No  . Drug Use: No  . Sexual Activity:    Partners: Male    Birth Control/ Protection: Surgical   Other Topics Concern  . None   Social History Narrative   Lives in [place] with her son. Is able to read and write fluently in [place].   Past Surgical History  Procedure Laterality Date  . Tubal ligation  05/31/1985  . Carpal tunnel release Left   . Hemiarthroplasty shoulder fracture Right 1980's  . Multiple tooth extractions  ~ 2011    tumors removed ; "my whole top"  . Cardiac catheterization  05/2011  . Tee without cardioversion N/A 01/14/2014    Procedure: TRANSESOPHAGEAL ECHOCARDIOGRAM (TEE);  Surgeon: Thurmon Fair, MD;  Location: [facility] ENDOSCOPY;  Service: Cardiovascular;  Laterality: N/A;  . Breast surgery Bilateral 2011    patient reports benign results  . Bi-ventricular implantable cardioverter defibrillator  (crt-d)  08/2013    [name] 08/23/2013  . Bi-ventricular implantable cardioverter defibrillator N/A 08/22/2013    Procedure: BI-VENTRICULAR IMPLANTABLE CARDIOVERTER DEFIBRILLATOR  (  CRT-D);  Surgeon: [name], MD;  Location: [facility] CATH LAB;  Service: Cardiovascular;  Laterality: N/A;   Past Medical History  Diagnosis Date  . Hypertension     Poorly controlled. Has had HTN since age 39. Angioedema with ACEI.  24 Hr urine and renal arterial dopplers ordered . . . Never done  . NICM (nonischemic cardiomyopathy)     EF 45-50% in 8/12, cath 6/12 showed normal coronaries, EF 50-55% by LV gram  . Morbid obesity   . Allergic rhinitis   . HLD (hyperlipidemia)   . OSA on CPAP     sleep study in 8/12 showed moderate to severe OSA requiring CPAP  . Chronic lower back pain     secondary to DJD, obsetiy, hip problems. Followed by Dr. Ivory Broad (pain management)  . Chronic diastolic heart failure      Primarily  diastolic CHF: Likely due to uncontrolled HTN. Last echo (8/12) with EF 45-50%, mild to moderate LVH with some asymmetric septal hypertrophy, RV normal size and systolic function. EF 50-55% by LV-gram in 6/12.   . Polyneuropathy in diabetes(357.2)   . Thoracic or lumbosacral neuritis or radiculitis, unspecified   . Calcifying tendinitis of shoulder   . LBBB (left bundle branch block)   . Chronic combined systolic and diastolic CHF (congestive heart failure)     EF 40-45% by echo 12/06/2012  . Coronary artery disease     questionable. LHC 05/2011 showing normal coronaries // Followed at [facility]iology, Dr. Shirlee Latch  . GERD (gastroesophageal reflux disease)   . Shortness of breath   . Asthma   . Automatic implantable cardioverter-defibrillator in situ   . Stroke 12/2013    "my left side is paralyzed" (07/04/2014)  . Type II diabetes mellitus DX: 2002  . 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 Degeneration of lumbar or lumbosacral intervertebral disc   . Arthritis     "hips, back, legs, arms" (07/04/2014)  . Frequent UTI   . Liver disease    BP 159/106 mmHg  Pulse 121  SpO2 96%  Opioid Risk Score:   Fall Risk Score:  `1  Depression screen PHQ 2/9  Depression screen [facility] 2/9 09/02/2015 08/04/2015 07/07/2015 06/30/2015 06/11/2015 04/22/2015 03/12/2015  Decreased Interest 0 0 0 0 0 0 0  Down, Depressed, Hopeless 0 0 0 0 0 0 1  PHQ - 2 Score 0 0 0 0 0 0 1  Altered sleeping - - - - - - 1  Tired, decreased energy - - - - - - 1  Change in appetite - - - - - - 1  Feeling bad or failure about yourself  - - - - - - 1  Trouble concentrating - - - - - - 1  Moving slowly or fidgety/restless - - - - - - 1  Suicidal thoughts - - - - - - 0  PHQ-9 Score - - - - - - 7     Review of Systems  All other systems reviewed and are negative.      Objective:   Physical Exam  Constitutional: She is oriented to person, place, and time. She appears well-developed and well-nourished.    HENT:  Head: Normocephalic and atraumatic.  Neck: Normal range of motion. Neck supple.  Cardiovascular: Normal rate and regular rhythm.   Pulmonary/Chest: Effort normal and breath sounds normal.  Musculoskeletal:  Normal Muscle Bulk and Muscle Testing Reveals: Upper Extremities: Right: Full  ROM and Muscle Strength 5/5 Left: Left Hemiparesis  Lumbar Paraspinal Tenderness: L-4-L-5 Lower Extremities: Right: Full ROM and Muscle Strength 5/5 Left: Paresis: Decreased ROM with Extension and Flexion Muscle Strength 4/5 Arises from chair slowly using 4 prong cane for support Steppage Gait using 4 Prong Cane   Neurological: She is alert and oriented to person, place, and time.  Skin: Skin is warm and dry.  Psychiatric: She has a normal mood and affect.  Nursing note and vitals reviewed.         Assessment & Plan:  1. Chronic low back pain: Continue Current Medication Regime. Refilled: oxyCODONE 10/325mg  one tablet every 8 hours as needed.#90.  2. Diabetic peripheral neuropathy: Continue to monitor  3. Recent Right MCA infarct: Left leg with spasms. Continue Baclofen..Botox injection with Dr. Riley Kill on October 11,16. 4. OA: Continue Voltaren gel   20 minutes of face to face patient care time was spent during this visit. All questions were encouraged and answered.  F/U in 1 month

## 2015-09-03 LAB — PRESCRIPTION MONITORING PROFILE (SOLSTAS)
Amphetamine/Meth: NEGATIVE ng/mL
BUPRENORPHINE, URINE: NEGATIVE ng/mL
Barbiturate Screen, Urine: NEGATIVE ng/mL
Benzodiazepine Screen, Urine: NEGATIVE ng/mL
CANNABINOID SCRN UR: NEGATIVE ng/mL
CARISOPRODOL, URINE: NEGATIVE ng/mL
COCAINE METABOLITES: NEGATIVE ng/mL
CREATININE, URINE: 244.11 mg/dL (ref >20.0)
ECSTASY: NEGATIVE ng/mL
FENTANYL URINE: NEGATIVE ng/mL
MEPERIDINE UR: NEGATIVE ng/mL
Methadone Screen, Urine: NEGATIVE ng/mL
Nitrites, Initial: NEGATIVE ug/mL
OPIATE SCREEN, URINE: NEGATIVE ng/mL
Oxycodone Screen, Ur: NEGATIVE ng/mL
PH URINE, INITIAL: 5.5 pH (ref 4.5–8.9)
Propoxyphene: NEGATIVE ng/mL
TRAMADOL UR: NEGATIVE ng/mL
Tapentadol, urine: NEGATIVE ng/mL
ZOLPIDEM, URINE: NEGATIVE ng/mL

## 2015-09-03 LAB — PMP ALCOHOL METABOLITE (ETG): ETGU: NEGATIVE ng/mL

## 2015-09-09 ENCOUNTER — Encounter: Payer: Self-pay | Admitting: Internal Medicine

## 2015-09-09 ENCOUNTER — Ambulatory Visit (INDEPENDENT_AMBULATORY_CARE_PROVIDER_SITE_OTHER): Payer: Medicaid Other | Admitting: Internal Medicine

## 2015-09-09 VITALS — BP 150/103 | HR 113 | Temp 98.2°F | Ht 69.0 in | Wt 268.7 lb

## 2015-09-09 DIAGNOSIS — M25511 Pain in right shoulder: Secondary | ICD-10-CM | POA: Diagnosis not present

## 2015-09-09 DIAGNOSIS — K5901 Slow transit constipation: Secondary | ICD-10-CM

## 2015-09-09 DIAGNOSIS — R531 Weakness: Secondary | ICD-10-CM | POA: Diagnosis not present

## 2015-09-09 DIAGNOSIS — I10 Essential (primary) hypertension: Secondary | ICD-10-CM

## 2015-09-09 DIAGNOSIS — E1140 Type 2 diabetes mellitus with diabetic neuropathy, unspecified: Secondary | ICD-10-CM

## 2015-09-09 DIAGNOSIS — K5900 Constipation, unspecified: Secondary | ICD-10-CM | POA: Insufficient documentation

## 2015-09-09 DIAGNOSIS — Z0000 Encounter for general adult medical examination without abnormal findings: Secondary | ICD-10-CM

## 2015-09-09 DIAGNOSIS — Z23 Encounter for immunization: Secondary | ICD-10-CM | POA: Diagnosis present

## 2015-09-09 DIAGNOSIS — Z794 Long term (current) use of insulin: Secondary | ICD-10-CM | POA: Diagnosis not present

## 2015-09-09 DIAGNOSIS — I639 Cerebral infarction, unspecified: Secondary | ICD-10-CM

## 2015-09-09 DIAGNOSIS — K5909 Other constipation: Secondary | ICD-10-CM | POA: Diagnosis not present

## 2015-09-09 LAB — GLUCOSE, CAPILLARY: Glucose-Capillary: 179 mg/dL — ABNORMAL HIGH (ref 65–99)

## 2015-09-09 LAB — POCT GLYCOSYLATED HEMOGLOBIN (HGB A1C): Hemoglobin A1C: 7

## 2015-09-09 MED ORDER — DOCUSATE SODIUM 50 MG PO CAPS: 50.0000 mg | ORAL_CAPSULE | Freq: Two times a day (BID) | ORAL | Status: DC | PRN

## 2015-09-09 MED ORDER — POLYETHYLENE GLYCOL 3350 17 G PO PACK: 17.0000 g | PACK | Freq: Every day | ORAL | Status: DC

## 2015-09-09 NOTE — Patient Instructions (Signed)
1. Please return to clinic in 3 months for follow up appointment.  2. Please take all medications as previously prescribed with the following changes:  Continue taking Metformin.   Take Colace and Miralax as needed for constipation. I have sent these to your pharmacy.   3. If you have worsening of your symptoms or new symptoms arise, please call the clinic ([phone]), or go to the ER immediately if symptoms are severe.  You have done a great job in taking all your medications. Please continue to do this.

## 2015-09-09 NOTE — Progress Notes (Signed)
Subjective:   Patient ID: [name] female   DOB: 05-25-1966 49 y.o.   MRN: [id number]  HPI: [name] is a 49 y.o. female w/ PMHx of HTN, HLD, uncontrolled DM type II, OSA, CAD, CHF, and h/o CVA w/ left-sided residual weakness, and seizures, presents to the clinic today for a follow up visit regarding her DM and other chronic medical issues. [name] states she is not feeling that well for the past day or so, attributes this to taking her Hydralazine. She states he has been feeling somewhat lightheaded, decreased energy, and fatigued, but feels this is mostly related to taking her medications. Today, she seems to be doing quite well, however, smiling and in good spirits, accompanied by her sister in law.  She continues to complain about right shoulder pain. States it is worse w/ activity, mostly while driving. ROM intact. Has been using Voltaren gel but states this has not been helping that much. Also takes percocet prn as prescribed by PM&R.  Also admits to some constipation since her narcotic pain medication was increased. States she has a bowel movement about ever other day or so. No blood or abdominal pain noted.  Lastly, says she has been having some skin issues, specifically on her back side, neck, and arm. She states the lesions are itchy but go away mostly on their own. The lesions on her back side have been well controlled w/ Nystatin powder. She denies any recent changes in detergents, soaps, lotions, or new jewelry.   Current Outpatient Prescriptions  Medication Sig Dispense Refill  . ACCU-CHEK FASTCLIX LANCETS MISC Use 1-2 times daily when checking blood sugar. ICD10: E11.9 102 each 11  . amLODipine (NORVASC) 10 MG tablet Take 1 tablet (10 mg total) by mouth daily. 90 tablet 1  . aspirin 325 MG tablet Take 1 tablet (325 mg total) by mouth daily. 30 tablet 5  . Blood Glucose Monitoring Suppl (ACCU-CHEK AVIVA PLUS) W/DEVICE KIT 1 each by Does not apply route 3 (three)  times daily. 1 kit 0  . citalopram (CELEXA) 20 MG tablet Take 1 tablet (20 mg total) by mouth daily. 30 tablet 5  . cyclobenzaprine (FLEXERIL) 10 MG tablet Take 1 tablet (10 mg total) by mouth 2 (two) times daily as needed for muscle spasms. 30 tablet 1  . diclofenac sodium (VOLTAREN) 1 % GEL Apply 2 g topically 4 (four) times daily as needed (pain). 3 Tube 5  . digoxin (LANOXIN) 0.125 MG tablet Take 1 tablet (0.125 mg total) by mouth daily. 30 tablet 5  . docusate sodium (COLACE) 50 MG capsule Take 1 capsule (50 mg total) by mouth 2 (two) times daily as needed for mild constipation. 60 capsule 6  . fluticasone (CUTIVATE) 0.05 % cream Apply topically 2 (two) times daily. Dispense two tubes 30 g 6  . furosemide (LASIX) 40 MG tablet Take 1 tablet (40 mg total) by mouth 2 (two) times a week. 10 tablet 6  . glucose blood (ACCU-CHEK SMARTVIEW) test strip Use to Check Blood Sugar TID. Code: E11.40. 100 each 12  . hydrALAZINE (APRESOLINE) 50 MG tablet Take 1.5 tablets (75 mg total) by mouth 3 (three) times daily. 270 tablet 3  . ibuprofen (ADVIL,MOTRIN) 200 MG tablet Take 200 mg by mouth every 6 (six) hours as needed for moderate pain.    . Incontinence Supplies MISC 1 year supply of Pull-ups in size XL Diagnosis Code: 788.33 1 each 0  . Insulin Glargine (LANTUS SOLOSTAR)  100 UNIT/ML Solostar Pen Inject 10 units in the AM, 35 units in the PM (Patient taking differently: Inject 10-35 Units into the skin See admin instructions. Inject 10 units in the AM, 35 units in the PM) 15 mL 11  . Insulin Pen Needle (GLOBAL EASE INJECT PEN NEEDLES) 31G X 5 MM MISC Use to inject insulin BID. Code: E 11.40 100 each 10  . ipratropium (ATROVENT) 0.06 % nasal spray Place 2 sprays into both nostrils 4 (four) times daily. 15 mL 1  . levETIRAcetam (KEPPRA) 500 MG tablet Take 1 tablet (500 mg total) by mouth 2 (two) times daily. 60 tablet 5  . metFORMIN (GLUCOPHAGE) 1000 MG tablet Take 1 tablet (1,000 mg total) by mouth daily  with breakfast. 90 tablet 3  . metoprolol succinate (TOPROL-XL) 100 MG 24 hr tablet Take 1 tablet (100 mg total) by mouth 2 (two) times daily. 60 tablet 0  . nystatin (MYCOSTATIN/NYSTOP) 100000 UNIT/GM POWD Apply to affected area twice daily until healed. 30 g 0  . oxyCODONE-acetaminophen (PERCOCET) 10-325 MG per tablet Take 1 tablet by mouth every 8 (eight) hours as needed for pain. 90 tablet 0  . pantoprazole (PROTONIX) 40 MG tablet Take 40 mg by mouth daily.    . Potassium Chloride ER 20 MEQ TBCR Take 20 mEq by mouth daily. 90 tablet 3  . simvastatin (ZOCOR) 40 MG tablet Take 40 mg by mouth at bedtime.     . solifenacin (VESICARE) 10 MG tablet Take 10 mg by mouth daily.    . Vitamin D, Ergocalciferol, (DRISDOL) 50000 UNITS CAPS capsule Take 1 capsule (50,000 Units total) by mouth every 7 (seven) days. (Patient taking differently: Take 50,000 Units by mouth every Monday. ) 24 capsule 0   No current facility-administered medications for this visit.   Review of Systems  General: Positive for fatigue. Denies fever, diaphoresis, appetite change.  Respiratory: Denies SOB, cough, and wheezing.   Cardiovascular: Denies chest pain and palpitations.  Gastrointestinal: Positive for constipation. Denies nausea, vomiting, abdominal pain, and diarrhea Musculoskeletal: Positive for right shoulder pain. Denies myalgias, back pain, and gait problem.  Neurological: Denies dizziness, syncope, weakness, lightheadedness, and headaches.  Psychiatric/Behavioral: Denies mood changes, sleep disturbance, and agitation.   Objective:   Physical Exam: Filed Vitals:   09/09/15 1507  BP: 150/103  Pulse: 113  Temp: 98.2 F (36.8 C)  TempSrc: Oral  Height: $Remove'5\' 9"'[id number]$  (1.753 m)  Weight: 268 lb 11.2 oz (121.882 kg)  SpO2: 98%     General: Alert, cooperative, NAD. HEENT: PERRL, EOMI. Moist mucus membranes. Left-sided facial droop.  Neck: Full range of motion without pain, supple, no lymphadenopathy or carotid  bruits Lungs: Clear to ascultation bilaterally, normal work of respiration, no wheezes, rales, rhonchi Heart: RRR, no murmurs, gallops, or rubs Abdomen: Soft, non-tender, non-distended, BS + Extremities: No cyanosis, clubbing, or edema. Left upper and lower extremity weakness. Left great toe/metatarsal wound, 1.0 x 1.0 cm in size, small ulceration in the center, no discharge. Very mild surrounding erythema, tender to the touch. No fluctuance or induration. Right shoulder w/ mild tenderness over the glenohumeral joint. ROM intact. Mild pain w/ extreme abduction.  Neurologic: Alert & oriented X3, cranial nerves II-XII intact, 1/5 strength in LUE, 3/5 in LLE. Otherwise strength intact.   Assessment & Plan:   Please see problem based assessment and plan.

## 2015-09-10 ENCOUNTER — Ambulatory Visit: Payer: Medicaid Other | Admitting: Internal Medicine

## 2015-09-10 NOTE — Assessment & Plan Note (Signed)
Patient continues to have right shoulder pain. ROM intact on exam, only mild tenderness on palpation of the glenohumeral joint. XR of the shoulder in 06/2015 revealed an increase conspicuity of bony irregularity adjacent to the greater tuberosity, suggestive of a Hill-Sachs lesion. There are also mild degenerative changes of the [facility] joint. No acute fracture.  -Given that patient does not have h/o dislocation to suggest explanation for [facility] lesion, suspect much of her pain is related to degenerative changes. States she received a joint injection w/ Dr. Lajoyce Corners in the recent past which did not help.  -Suggested mobility exercises and PT per Dr. Riley Kill who the patient follows with regularly.  -If this continues to be an issue, can return to Dr. Lajoyce Corners for further assessment and management -In the mean time, suggested continues Voltaren gel for inflammation and prn percocet for pain. Also ice.

## 2015-09-10 NOTE — Assessment & Plan Note (Signed)
2/2 narcotic use. States she is having 1 BM per 2-3 days. No blood or abdominal pain. Discussed diet and ambulation.  -Refill Colace + Miralax prn for constipation

## 2015-09-10 NOTE — Assessment & Plan Note (Signed)
Lab Results  Component Value Date   HGBA1C 7.0 09/09/2015   HGBA1C 7.2 04/22/2015   HGBA1C 7.4 01/14/2015     Assessment: Diabetes control:  Improved.  Comments: HbA1c continues to improve. Patient has been compliant w/ Lantus, states she will miss some dose of her Metformin at times. Reinforced the importance of this. Denies any obvious symptoms of hypoglycemia, and also denies any low blood sugars when she checks.   Plan: Medications:  continue current medications; Lantus 10 units in AM, 35 units in PM. Continue Metformin. Could potentially decrease Lantus dosage in future if HbA1c continues to improve.  Home glucose monitoring: Frequency:  qd to bid Timing: before breakfast Instruction/counseling given: reminded to bring blood glucose meter & log to each visit, reminded to bring medications to each visit, discussed foot care, discussed the need for weight loss and discussed diet Other plans: RTC in 3 months

## 2015-09-10 NOTE — Assessment & Plan Note (Signed)
BP Readings from Last 3 Encounters:  09/09/15 150/103  09/02/15 169/99  08/04/15 190/107    Lab Results  Component Value Date   NA 136 06/13/2015   K 3.5 06/13/2015   CREATININE 0.78 06/13/2015    Assessment: Blood pressure control:  Elevated, yet somewhat better than normal Comments: Patient does not tolerate Hydralazine very well according to the patient. Recently had Amlodipine increased to 10 mg daily per CHF note. Has not taken Hydralazine today.   Plan: Medications:  continue current medications; discussed w/ patient, to take hydralazine as she can tolerate. Otherwise will continue other medication. Given benefit of Hydralazine w/ Imdur in the setting of CHF, will defer further changes to CHF team.  Other plans: RTC in 3 months unless needs to be seen sooner.

## 2015-09-10 NOTE — Assessment & Plan Note (Signed)
Patient admits to some generalized weakness and fatigue that is intermittent in nature. I suspect this is generally related to polypharmacy given the numerous medications that [name] takes. She states the fatigue is the worst related to Hydralazine. Recent CBC not significant for anemia, previous TSH (2014) was normal. Weight has been stable for the most part.  -If this continues to be an issue in the future, can recheck some basic labs including CBC, TSH, cortisol, etc.

## 2015-09-10 NOTE — Assessment & Plan Note (Signed)
Flu shot given today

## 2015-09-16 ENCOUNTER — Encounter: Payer: Self-pay | Admitting: *Deleted

## 2015-09-18 ENCOUNTER — Other Ambulatory Visit: Payer: Self-pay | Admitting: *Deleted

## 2015-09-18 MED ORDER — DICLOFENAC SODIUM 1 % TD GEL: 2.0000 g | Freq: Four times a day (QID) | TRANSDERMAL | Status: DC | PRN

## 2015-09-19 NOTE — Progress Notes (Signed)
[facility] Medicine Clinic Attending  Case discussed with Dr. Jones soon after the resident saw the patient.  We reviewed the resident's history and exam and pertinent patient test results.  I agree with the assessment, diagnosis, and plan of care documented in the resident's note. 

## 2015-09-22 ENCOUNTER — Encounter (HOSPITAL_COMMUNITY): Payer: Self-pay

## 2015-09-22 ENCOUNTER — Ambulatory Visit (HOSPITAL_COMMUNITY)
Admission: RE | Admit: 2015-09-22 | Discharge: 2015-09-22 | Disposition: A | Discharge status: Home / Self Care | Payer: Medicaid Other | Source: Ambulatory Visit | Attending: Cardiology | Admitting: Cardiology

## 2015-09-22 VITALS — BP 138/86 | HR 90 | Wt 268.5 lb

## 2015-09-22 DIAGNOSIS — B372 Candidiasis of skin and nail: Secondary | ICD-10-CM | POA: Diagnosis not present

## 2015-09-22 DIAGNOSIS — I2510 Atherosclerotic heart disease of native coronary artery without angina pectoris: Secondary | ICD-10-CM | POA: Insufficient documentation

## 2015-09-22 DIAGNOSIS — I639 Cerebral infarction, unspecified: Secondary | ICD-10-CM | POA: Diagnosis not present

## 2015-09-22 DIAGNOSIS — G4733 Obstructive sleep apnea (adult) (pediatric): Secondary | ICD-10-CM | POA: Diagnosis not present

## 2015-09-22 DIAGNOSIS — I428 Other cardiomyopathies: Secondary | ICD-10-CM | POA: Diagnosis not present

## 2015-09-22 DIAGNOSIS — I5042 Chronic combined systolic (congestive) and diastolic (congestive) heart failure: Secondary | ICD-10-CM | POA: Diagnosis not present

## 2015-09-22 DIAGNOSIS — J45909 Unspecified asthma, uncomplicated: Secondary | ICD-10-CM | POA: Diagnosis not present

## 2015-09-22 DIAGNOSIS — Z794 Long term (current) use of insulin: Secondary | ICD-10-CM | POA: Insufficient documentation

## 2015-09-22 DIAGNOSIS — K219 Gastro-esophageal reflux disease without esophagitis: Secondary | ICD-10-CM | POA: Diagnosis not present

## 2015-09-22 DIAGNOSIS — E785 Hyperlipidemia, unspecified: Secondary | ICD-10-CM | POA: Insufficient documentation

## 2015-09-22 DIAGNOSIS — Z8673 Personal history of transient ischemic attack (TIA), and cerebral infarction without residual deficits: Secondary | ICD-10-CM | POA: Insufficient documentation

## 2015-09-22 DIAGNOSIS — I10 Essential (primary) hypertension: Secondary | ICD-10-CM | POA: Insufficient documentation

## 2015-09-22 DIAGNOSIS — I5032 Chronic diastolic (congestive) heart failure: Secondary | ICD-10-CM

## 2015-09-22 DIAGNOSIS — Z7984 Long term (current) use of oral hypoglycemic drugs: Secondary | ICD-10-CM | POA: Diagnosis not present

## 2015-09-22 DIAGNOSIS — Z7982 Long term (current) use of aspirin: Secondary | ICD-10-CM | POA: Insufficient documentation

## 2015-09-22 DIAGNOSIS — E1142 Type 2 diabetes mellitus with diabetic polyneuropathy: Secondary | ICD-10-CM | POA: Diagnosis not present

## 2015-09-22 DIAGNOSIS — Z95810 Presence of automatic (implantable) cardiac defibrillator: Secondary | ICD-10-CM | POA: Diagnosis not present

## 2015-09-22 DIAGNOSIS — Z79899 Other long term (current) drug therapy: Secondary | ICD-10-CM | POA: Insufficient documentation

## 2015-09-22 LAB — BASIC METABOLIC PANEL
Anion gap: 11 (ref 5–15)
BUN: 8 mg/dL (ref 6–20)
CHLORIDE: 102 mmol/L (ref 101–111)
CO2: 23 mmol/L (ref 22–32)
CREATININE: 0.72 mg/dL (ref 0.44–1.00)
Calcium: 9.4 mg/dL (ref 8.9–10.3)
GFR calc non Af Amer: >60 mL/min (ref >60)
GLUCOSE: 178 mg/dL — AB (ref 65–99)
Potassium: 4.1 mmol/L (ref 3.5–5.1)
Sodium: 136 mmol/L (ref 135–145)

## 2015-09-22 LAB — LIPID PANEL
Cholesterol: 201 mg/dL — ABNORMAL HIGH (ref 0–200)
HDL: 48 mg/dL (ref >40)
LDL CALC: 116 mg/dL — AB (ref 0–99)
TRIGLYCERIDES: 185 mg/dL — AB (ref <150)
Total CHOL/HDL Ratio: 4.2 RATIO
VLDL: 37 mg/dL (ref 0–40)

## 2015-09-22 LAB — BRAIN NATRIURETIC PEPTIDE: B NATRIURETIC PEPTIDE 5: 32.8 pg/mL (ref 0.0–100.0)

## 2015-09-22 LAB — DIGOXIN LEVEL: Digoxin Level: 1.4 ng/mL (ref 0.8–2.0)

## 2015-09-22 MED ORDER — ISOSORBIDE MONONITRATE ER 60 MG PO TB24: 60.0000 mg | ORAL_TABLET | Freq: Every day | ORAL | Status: DC

## 2015-09-22 MED ORDER — NYSTATIN 100000 UNIT/GM EX POWD: CUTANEOUS | Status: DC

## 2015-09-22 NOTE — Patient Instructions (Signed)
We will schedule you for an echo at our [facility] st office  Labs today will call if abnormal  Start Isosorbide 60 mg daily  Follow up in 3 months with [facility]   Echocardiogram An echocardiogram, or echocardiography, uses sound waves (ultrasound) to produce an image of your heart. The echocardiogram is simple, painless, obtained within a short period of time, and offers valuable information to your health care provider. The images from an echocardiogram can provide information such as:  Evidence of coronary artery disease (CAD).  Heart size.  Heart muscle function.  Heart valve function.  Aneurysm detection.  Evidence of a past heart attack.  Fluid buildup around the heart.  Heart muscle thickening.  Assess heart valve function. LET [facility] CARE PROVIDER KNOW ABOUT:  Any allergies you have.  All medicines you are taking, including vitamins, herbs, eye drops, creams, and over-the-counter medicines.  Previous problems you or members of your family have had with the use of anesthetics.  Any blood disorders you have.  Previous surgeries you have had.  Medical conditions you have.  Possibility of pregnancy, if this applies. BEFORE THE PROCEDURE  No special preparation is needed. Eat and drink normally.  PROCEDURE   In order to produce an image of your heart, gel will be applied to your chest and a wand-like tool (transducer) will be moved over your chest. The gel will help transmit the sound waves from the transducer. The sound waves will harmlessly bounce off your heart to allow the heart images to be captured in real-time motion. These images will then be recorded.  You may need an IV to receive a medicine that improves the quality of the pictures. AFTER THE PROCEDURE You may return to your normal schedule including diet, activities, and medicines, unless your health care provider tells you otherwise.   This information is not intended to replace advice given to you  by your health care provider. Make sure you discuss any questions you have with your health care provider.   Document Released: 11/26/2000 Document Revised: 12/20/2014 Document Reviewed: 08/06/2013 Elsevier Interactive Patient Education [employer]

## 2015-09-22 NOTE — Progress Notes (Signed)
Patient ID: [name], female   DOB: Jun 23, 1966, 49 y.o.   MRN: [id number]  EP: Dr Caryl Comes PCP: Dr Areta [facility] Primary HF Cardiologist: Dr Aundra Dubin   Angioedema with ACE-I  --Optivol--  HPI: 49 yo with history of morbid obesity, HTN, HLD, CAD, DM2, OSA on CPAP, CVA and combined CHF in the setting of poorly-controlled HTN. She was admitted in 05/2011 with chest pain and new LBBB and ended up getting a left heart cath, which showed no angiographic CAD. Renal artery dopplers were normal in 04/2012.  She has a Medtronic CRT-D device.   Was hospitalized May 2014 with acute on chronic combined systolic and diastolic CHF. EF from December 2013 was down to 40 to 45%.   Admitted in January 2015 with slurred speech, L arm weakness and facial droop. Initial head CT showed no acute abnormality. Repeat head CT showed subtle gray white matter differentiation in R frontal lobe and R operculum perhaps representing ischemic R sided MCA. STAT repeat CT head later on 1/29 showed further evolution of R MCA territory infarct involving R insular cortex and operculum. No hemorrhage. Neurology recommended CTA head/neck, which showed occlusion of MCA branch to the area of R MCA infarct and a normal neck CTA. TEE was done and did not reveal embolic source. Was discharged to inpatient rehab.  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  She was started on Keppra.  She went to the ER in 2/16 with syncope that was probably due to seizure, no arrhythmias with device interrogation.  Today she returns for HF follow up. She has been doing reasonably well.  Has residual left-sided weakness.  Walking with cane.  No exertional dyspnea, no chest pain.  Weight down 6 lbs.  She is a little lightheaded after taking hydralazine but is able to tolerate it ok.  She has run out of Imdur.   ECG: NSR with BiV pacing   ECHO 11/2012: 40-45% ECHO  07/2013: 20% RV normal ECHO     08/23/2013: EF 40-45%, diff HK ECHO   09/05/2013: EF 25-30%, diff HK  ECHO  12/11/13: EF 30%, diff HK, RV normal ECHO 01/14/2014: EF 35-40%, LA mod dilated  ECHO 4/16: EF 40-45%, moderate LVH, normal RV size with mildly decreased systolic function.    Labs 06/2013: proBNP 230, Cr 0.9, K 3.7          08/2013: K+ 3.9, Cr 0.9       09/2013: K 4.2, Cr 0.86, pro-BNP 718, dig level <0.3     10/07/13: K+ 4.1, Cr 0.83       05/08/14: K 4.0 Creatinine 0.67 Pro BNP 233 Dig level 0.3        08/20/2014 K 3.8 nCreatinine 0.56        2/16: K 3.6, creatinine 0.95, LDL 106, HDL 43       3/16: K 3.5, creatinine 0.87, HCT 35.9       7/16: K 3.5, creatinine 0.78. HCT 37  ROS: All systems negative except as listed in HPI, PMH and Problem List.  Past Medical History  Diagnosis Date  . Hypertension     Poorly controlled. Has had HTN since age 41. Angioedema with ACEI.  24 Hr urine and renal arterial dopplers ordered . . . Never done  . NICM (nonischemic cardiomyopathy) ([place])     EF 45-50% in 8/12, cath 6/12 showed normal coronaries, EF 50-55% by LV gram  . Morbid  obesity ([place])   . Allergic rhinitis   . HLD (hyperlipidemia)   . OSA on CPAP     sleep study in 8/12 showed moderate to severe OSA requiring CPAP  . Chronic lower back pain     secondary to DJD, obsetiy, hip problems. Followed by Dr. Oval Linsey (pain management)  . Chronic diastolic heart failure ([facility])      Primarily diastolic CHF: Likely due to uncontrolled HTN. Last echo (8/12) with EF 45-50%, mild to moderate LVH with some asymmetric septal hypertrophy, RV normal size and systolic function. EF 50-55% by LV-gram in 6/12.   . Polyneuropathy in diabetes(357.2)   . Thoracic or lumbosacral neuritis or radiculitis, unspecified   . Calcifying tendinitis of shoulder   . LBBB (left bundle branch block)   . Chronic combined systolic and diastolic CHF (congestive heart failure) ([place])     EF 40-45% by echo 12/06/2012  . Coronary artery disease      questionable. LHC 05/2011 showing normal coronaries // Followed at [facility], Dr. Aundra Dubin  . GERD (gastroesophageal reflux disease)   . Shortness of breath   . Asthma   . Automatic implantable cardioverter-defibrillator in situ   . Stroke [facility]) 12/2013    "my left side is paralyzed" (07/04/2014)  . Type II diabetes mellitus ([place]) DX: 2002  . 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 Degeneration of lumbar or lumbosacral intervertebral disc   . Arthritis     "hips, back, legs, arms" (07/04/2014)  . Frequent UTI   . Liver disease     Current Outpatient Prescriptions  Medication Sig Dispense Refill  . ACCU-CHEK FASTCLIX LANCETS MISC Use 1-2 times daily when checking blood sugar. ICD10: E11.9 102 each 11  . amLODipine (NORVASC) 10 MG tablet Take 1 tablet (10 mg total) by mouth daily. 90 tablet 1  . aspirin 325 MG tablet Take 1 tablet (325 mg total) by mouth daily. 30 tablet 5  . Blood Glucose Monitoring Suppl (ACCU-CHEK AVIVA PLUS) W/DEVICE KIT 1 each by Does not apply route 3 (three) times daily. 1 kit 0  . citalopram (CELEXA) 20 MG tablet Take 1 tablet (20 mg total) by mouth daily. 30 tablet 5  . cyclobenzaprine (FLEXERIL) 10 MG tablet Take 1 tablet (10 mg total) by mouth 2 (two) times daily as needed for muscle spasms. 30 tablet 1  . diclofenac sodium (VOLTAREN) 1 % GEL Apply 2 g topically 4 (four) times daily as needed (pain). 3 Tube 5  . digoxin (LANOXIN) 0.125 MG tablet Take 1 tablet (0.125 mg total) by mouth daily. 30 tablet 5  . docusate sodium (COLACE) 50 MG capsule Take 1 capsule (50 mg total) by mouth 2 (two) times daily as needed for mild constipation. 60 capsule 6  . fluticasone (CUTIVATE) 0.05 % cream Apply topically 2 (two) times daily. Dispense two tubes 30 g 6  . furosemide (LASIX) 40 MG tablet Take 1 tablet (40 mg total) by mouth 2 (two) times a week. 10 tablet 6  . glucose blood (ACCU-CHEK SMARTVIEW) test strip Use to Check Blood Sugar TID. Code:  E11.40. 100 each 12  . hydrALAZINE (APRESOLINE) 50 MG tablet Take 1.5 tablets (75 mg total) by mouth 3 (three) times daily. 270 tablet 3  . ibuprofen (ADVIL,MOTRIN) 200 MG tablet Take 200 mg by mouth every 6 (six) hours as needed for moderate pain.    . Incontinence Supplies MISC 1 year supply of Pull-ups in size XL  Diagnosis Code: 788.33 1 each 0  . Insulin Glargine (LANTUS SOLOSTAR) 100 UNIT/ML Solostar Pen Inject 10 units in the AM, 35 units in the PM (Patient taking differently: Inject 10-35 Units into the skin See admin instructions. Inject 10 units in the AM, 35 units in the PM) 15 mL 11  . Insulin Pen Needle (GLOBAL EASE INJECT PEN NEEDLES) 31G X 5 MM MISC Use to inject insulin BID. Code: E 11.40 100 each 10  . ipratropium (ATROVENT) 0.06 % nasal spray Place 2 sprays into both nostrils 4 (four) times daily. 15 mL 1  . levETIRAcetam (KEPPRA) 500 MG tablet Take 1 tablet (500 mg total) by mouth 2 (two) times daily. 60 tablet 5  . metFORMIN (GLUCOPHAGE) 1000 MG tablet Take 1 tablet (1,000 mg total) by mouth daily with breakfast. 90 tablet 3  . metoprolol succinate (TOPROL-XL) 100 MG 24 hr tablet Take 1 tablet (100 mg total) by mouth 2 (two) times daily. 60 tablet 0  . nystatin (MYCOSTATIN/NYSTOP) 100000 UNIT/GM POWD Apply to affected area twice daily until healed. 30 g 0  . oxyCODONE-acetaminophen (PERCOCET) 10-325 MG per tablet Take 1 tablet by mouth every 8 (eight) hours as needed for pain. 90 tablet 0  . pantoprazole (PROTONIX) 40 MG tablet Take 40 mg by mouth daily.    . polyethylene glycol (MIRALAX) packet Take 17 g by mouth daily. 14 each 0  . Potassium Chloride ER 20 MEQ TBCR Take 20 mEq by mouth daily. 90 tablet 3  . simvastatin (ZOCOR) 40 MG tablet Take 40 mg by mouth at bedtime.     . solifenacin (VESICARE) 10 MG tablet Take 10 mg by mouth daily.    . Vitamin D, Ergocalciferol, (DRISDOL) 50000 UNITS CAPS capsule Take 1 capsule (50,000 Units total) by mouth every 7 (seven) days.  (Patient taking differently: Take 50,000 Units by mouth every Monday. ) 24 capsule 0  . isosorbide mononitrate (IMDUR) 60 MG 24 hr tablet Take 1 tablet (60 mg total) by mouth daily. 90 tablet 3   No current facility-administered medications for this encounter.   Filed Vitals:   09/22/15 1058  BP: 138/86  Pulse: 90  Weight: 268 lb 8 oz (121.791 kg)  SpO2: 98%   PHYSICAL EXAM: General:  NAD. No resp difficulty; Neck: supple. Thick. JVP 7 cm. Carotids 2+ bilaterally; no bruits. No lymphadenopathy appreciated.  Cor: PMI nonpalpable; Regular rate & rhythm. No rubs, gallops or murmurs. No S3/S4.  Lungs: clear Abdomen: obese soft, nontender, mildly distended. No hepatosplenomegaly. No bruits or masses. Good bowel sounds. Extremities: no cyanosis, clubbing, rash, trace ankle edema.  Neuro: alert & orientedx3, cranial nerves grossly intact. Moves all 4 extremities w/o difficulty. Affect pleasant.  ASSESSMENT: 1. Chronic systolic HF: Nonischemic cardiomyopathy, normal coronaries on 6/12 angiography.  CMP may be due to HTN.  Echo 03/2014 EF 40-45%.  S/P CRT-D Medtronic.  NYHA class II symptoms, volume status stable.  - Continue Lasix twice weekly.  Will check BMET/BNP today.   - Continue current dose of Toprol XL. Continue hydralazine 75 mg tid, restart Imdur 60 mg daily.   - Continue digoxin, check level today.  - No  ACE-I due to angioedema.  Would start spironolactone at next appointment.  - I will get an echo to reassess LV systolic function.   - Reinforced the need and importance of daily weights, a low sodium diet, and fluid restriction (less than 2 L a day). Instructed to call the HF clinic if weight increases more  than 3 lbs overnight or 5 lbs in a week.  2. HTN: BP controlled on current regimen.  3. Morbid obesity:  Losing weight.   4. OSA on CPAP: Uses intermittently. Encouraged to use nightly.  5. CVA:  Continue ASA, statin.  Check lipids today, goal LDL < 70.   Loralie Champagne 09/22/2015

## 2015-09-23 ENCOUNTER — Encounter: Payer: Self-pay | Admitting: Physical Medicine & Rehabilitation

## 2015-09-23 ENCOUNTER — Encounter: Payer: Medicaid Other | Attending: Physical Medicine & Rehabilitation | Admitting: Physical Medicine & Rehabilitation

## 2015-09-23 VITALS — BP 161/101 | HR 106

## 2015-09-23 DIAGNOSIS — M47817 Spondylosis without myelopathy or radiculopathy, lumbosacral region: Secondary | ICD-10-CM | POA: Insufficient documentation

## 2015-09-23 DIAGNOSIS — G629 Polyneuropathy, unspecified: Secondary | ICD-10-CM | POA: Diagnosis present

## 2015-09-23 DIAGNOSIS — Z79899 Other long term (current) drug therapy: Secondary | ICD-10-CM | POA: Insufficient documentation

## 2015-09-23 DIAGNOSIS — I69354 Hemiplegia and hemiparesis following cerebral infarction affecting left non-dominant side: Secondary | ICD-10-CM | POA: Diagnosis not present

## 2015-09-23 DIAGNOSIS — G8114 Spastic hemiplegia affecting left nondominant side: Secondary | ICD-10-CM

## 2015-09-23 DIAGNOSIS — I63311 Cerebral infarction due to thrombosis of right middle cerebral artery: Secondary | ICD-10-CM

## 2015-09-23 DIAGNOSIS — I5042 Chronic combined systolic (congestive) and diastolic (congestive) heart failure: Secondary | ICD-10-CM | POA: Diagnosis present

## 2015-09-23 DIAGNOSIS — Z5181 Encounter for therapeutic drug level monitoring: Secondary | ICD-10-CM | POA: Diagnosis present

## 2015-09-23 DIAGNOSIS — I63319 Cerebral infarction due to thrombosis of unspecified middle cerebral artery: Secondary | ICD-10-CM | POA: Diagnosis present

## 2015-09-23 DIAGNOSIS — E1342 Other specified diabetes mellitus with diabetic polyneuropathy: Secondary | ICD-10-CM | POA: Diagnosis present

## 2015-09-23 DIAGNOSIS — E1142 Type 2 diabetes mellitus with diabetic polyneuropathy: Secondary | ICD-10-CM | POA: Diagnosis not present

## 2015-09-23 DIAGNOSIS — G8110 Spastic hemiplegia affecting unspecified side: Secondary | ICD-10-CM | POA: Diagnosis present

## 2015-09-23 HISTORY — DX: Spastic hemiplegia affecting left nondominant side: G81.14

## 2015-09-23 MED ORDER — OXYCODONE-ACETAMINOPHEN 10-325 MG PO TABS: 1.0000 | ORAL_TABLET | Freq: Three times a day (TID) | ORAL | Status: DC | PRN

## 2015-09-23 NOTE — Progress Notes (Signed)
Botox Injection for spasticity using needle EMG guidance Indication: spastic left hemiparesis, upper ext  Dilution: 100 Units/ml        Total Units Injected: 400 Indication: Severe spasticity which interferes with ADL,mobility and/or  hygiene and is unresponsive to medication management and other conservative care Informed consent was obtained after describing risks and benefits of the procedure with the patient. This includes bleeding, bruising, infection, excessive weakness, or medication side effects. A REMS form is on file and signed.  Needle: 93mm injectable monopolar needle electrode  Number of units per muscle Pectoralis Major 0 units Pectoralis Minor 0 units Biceps 100 units 4 access points Brachioradialis 0 units FCR 0 units FCU 0 units FDS 150 units FDP 150 units FPL 0 units Pronator Teres 0 units Pronator Quadratus 0 units Quadriceps 0 units Gastroc/soleus 0 units Hamstrings 0 units Tibialis Posterior 0 units EHL 0 units All injections were done after obtaining appropriate EMG activity and after negative drawback for blood. The patient tolerated the procedure well. Post procedure instructions were given. A followup appointment was made.   Percocet was refilled. i will see her back in about a month for synvisc left knee

## 2015-09-26 ENCOUNTER — Telehealth (HOSPITAL_COMMUNITY): Payer: Self-pay

## 2015-09-26 MED ORDER — ATORVASTATIN CALCIUM 40 MG PO TABS: 40.0000 mg | ORAL_TABLET | Freq: Every day | ORAL | Status: DC

## 2015-09-26 NOTE — Telephone Encounter (Signed)
Patient returning call for lab results  Instructed patient to:  Stop simvastatin.  Start atorvastatin 40 mg.  Repeat lipid level in 2 months.  Return Monday morning to have digoxin level redrawn.  Do not take digoxin before your lab is drawn.

## 2015-09-29 ENCOUNTER — Emergency Department (HOSPITAL_COMMUNITY)
Admission: EM | Admit: 2015-09-29 | Discharge: 2015-09-30 | Disposition: A | Discharge status: Home / Self Care | Payer: Medicaid Other | Attending: Emergency Medicine | Admitting: Emergency Medicine

## 2015-09-29 ENCOUNTER — Other Ambulatory Visit: Payer: Self-pay | Admitting: *Deleted

## 2015-09-29 ENCOUNTER — Emergency Department (HOSPITAL_COMMUNITY): Payer: Medicaid Other

## 2015-09-29 ENCOUNTER — Encounter (HOSPITAL_COMMUNITY): Payer: Self-pay | Admitting: Emergency Medicine

## 2015-09-29 ENCOUNTER — Ambulatory Visit (HOSPITAL_COMMUNITY)
Admission: RE | Admit: 2015-09-29 | Discharge: 2015-09-29 | Disposition: A | Discharge status: Home / Self Care | Payer: Medicaid Other | Source: Ambulatory Visit | Attending: Cardiology | Admitting: Cardiology

## 2015-09-29 DIAGNOSIS — Z8673 Personal history of transient ischemic attack (TIA), and cerebral infarction without residual deficits: Secondary | ICD-10-CM | POA: Diagnosis not present

## 2015-09-29 DIAGNOSIS — Z7982 Long term (current) use of aspirin: Secondary | ICD-10-CM | POA: Insufficient documentation

## 2015-09-29 DIAGNOSIS — I5032 Chronic diastolic (congestive) heart failure: Secondary | ICD-10-CM | POA: Diagnosis not present

## 2015-09-29 DIAGNOSIS — J45909 Unspecified asthma, uncomplicated: Secondary | ICD-10-CM | POA: Insufficient documentation

## 2015-09-29 DIAGNOSIS — I5022 Chronic systolic (congestive) heart failure: Secondary | ICD-10-CM

## 2015-09-29 DIAGNOSIS — Y998 Other external cause status: Secondary | ICD-10-CM | POA: Insufficient documentation

## 2015-09-29 DIAGNOSIS — Z9889 Other specified postprocedural states: Secondary | ICD-10-CM | POA: Insufficient documentation

## 2015-09-29 DIAGNOSIS — E785 Hyperlipidemia, unspecified: Secondary | ICD-10-CM | POA: Diagnosis not present

## 2015-09-29 DIAGNOSIS — K219 Gastro-esophageal reflux disease without esophagitis: Secondary | ICD-10-CM | POA: Insufficient documentation

## 2015-09-29 DIAGNOSIS — E119 Type 2 diabetes mellitus without complications: Secondary | ICD-10-CM | POA: Insufficient documentation

## 2015-09-29 DIAGNOSIS — Z794 Long term (current) use of insulin: Secondary | ICD-10-CM | POA: Diagnosis not present

## 2015-09-29 DIAGNOSIS — Y9289 Other specified places as the place of occurrence of the external cause: Secondary | ICD-10-CM | POA: Insufficient documentation

## 2015-09-29 DIAGNOSIS — S3992XA Unspecified injury of lower back, initial encounter: Secondary | ICD-10-CM | POA: Insufficient documentation

## 2015-09-29 DIAGNOSIS — Z9981 Dependence on supplemental oxygen: Secondary | ICD-10-CM | POA: Insufficient documentation

## 2015-09-29 DIAGNOSIS — W19XXXA Unspecified fall, initial encounter: Secondary | ICD-10-CM

## 2015-09-29 DIAGNOSIS — M545 Low back pain, unspecified: Secondary | ICD-10-CM

## 2015-09-29 DIAGNOSIS — I2510 Atherosclerotic heart disease of native coronary artery without angina pectoris: Secondary | ICD-10-CM | POA: Insufficient documentation

## 2015-09-29 DIAGNOSIS — Y9389 Activity, other specified: Secondary | ICD-10-CM | POA: Insufficient documentation

## 2015-09-29 DIAGNOSIS — Z95810 Presence of automatic (implantable) cardiac defibrillator: Secondary | ICD-10-CM | POA: Diagnosis not present

## 2015-09-29 DIAGNOSIS — Z87440 Personal history of urinary (tract) infections: Secondary | ICD-10-CM | POA: Diagnosis not present

## 2015-09-29 DIAGNOSIS — W06XXXA Fall from bed, initial encounter: Secondary | ICD-10-CM | POA: Diagnosis not present

## 2015-09-29 DIAGNOSIS — G8929 Other chronic pain: Secondary | ICD-10-CM | POA: Diagnosis not present

## 2015-09-29 DIAGNOSIS — G4733 Obstructive sleep apnea (adult) (pediatric): Secondary | ICD-10-CM | POA: Diagnosis not present

## 2015-09-29 DIAGNOSIS — M1990 Unspecified osteoarthritis, unspecified site: Secondary | ICD-10-CM | POA: Diagnosis not present

## 2015-09-29 DIAGNOSIS — Z79899 Other long term (current) drug therapy: Secondary | ICD-10-CM | POA: Insufficient documentation

## 2015-09-29 DIAGNOSIS — K5901 Slow transit constipation: Secondary | ICD-10-CM

## 2015-09-29 DIAGNOSIS — I10 Essential (primary) hypertension: Secondary | ICD-10-CM | POA: Diagnosis not present

## 2015-09-29 LAB — DIGOXIN LEVEL: DIGOXIN LVL: 0.2 ng/mL — AB (ref 0.8–2.0)

## 2015-09-29 MED ORDER — OXYCODONE-ACETAMINOPHEN 5-325 MG PO TABS
1.0000 | ORAL_TABLET | Freq: Once | ORAL | Status: AC
Start: 2015-09-29 — End: 2015-09-29
  Administered 2015-09-29: 1 via ORAL
  Filled 2015-09-29: qty 1

## 2015-09-29 NOTE — ED Notes (Signed)
Pt reports ongoing R shoulder pain which she has been seeing her regular doctor for. Sunday morning she slid out of bed and since then she has been having increased R lower back/hip pain. And more difficulty walking. Pt with hx of stroke with L leg weakness as residual, but she sts it is more difficult to walk now. Pt also reports she has been seeing doctor of increased digoxin level which they rechecked today.

## 2015-09-30 MED ORDER — CYCLOBENZAPRINE HCL 10 MG PO TABS: 10.0000 mg | ORAL_TABLET | Freq: Two times a day (BID) | ORAL | Status: DC | PRN

## 2015-09-30 MED ORDER — POLYETHYLENE GLYCOL 3350 17 G PO PACK: 17.0000 g | PACK | Freq: Every day | ORAL | Status: DC
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
## 2015-09-30 NOTE — Discharge Instructions (Signed)
X-ray showed no fracture bones. We sore for several days. Ice pack. Muscle relaxer medicine.

## 2015-09-30 NOTE — ED Notes (Signed)
Pt stable, ambulatory, states understanding of discharge instructions 

## 2015-10-01 ENCOUNTER — Other Ambulatory Visit (HOSPITAL_COMMUNITY): Payer: Medicaid Other

## 2015-10-09 ENCOUNTER — Ambulatory Visit: Payer: Medicaid Other | Admitting: Neurology

## 2015-10-10 ENCOUNTER — Other Ambulatory Visit (HOSPITAL_COMMUNITY): Payer: Self-pay | Admitting: *Deleted

## 2015-10-10 DIAGNOSIS — B372 Candidiasis of skin and nail: Secondary | ICD-10-CM

## 2015-10-10 MED ORDER — NYSTATIN 100000 UNIT/GM EX POWD: CUTANEOUS | Status: DC

## 2015-10-14 ENCOUNTER — Encounter: Payer: Self-pay | Admitting: *Deleted

## 2015-10-15 ENCOUNTER — Other Ambulatory Visit: Payer: Self-pay

## 2015-10-15 ENCOUNTER — Other Ambulatory Visit (HOSPITAL_COMMUNITY): Payer: Medicaid Other

## 2015-10-15 ENCOUNTER — Other Ambulatory Visit: Payer: Self-pay | Admitting: *Deleted

## 2015-10-15 DIAGNOSIS — E1165 Type 2 diabetes mellitus with hyperglycemia: Principal | ICD-10-CM

## 2015-10-15 DIAGNOSIS — IMO0002 Reserved for concepts with insufficient information to code with codable children: Secondary | ICD-10-CM

## 2015-10-15 DIAGNOSIS — F32A Depression, unspecified: Secondary | ICD-10-CM

## 2015-10-15 DIAGNOSIS — F329 Major depressive disorder, single episode, unspecified: Secondary | ICD-10-CM

## 2015-10-15 DIAGNOSIS — E1140 Type 2 diabetes mellitus with diabetic neuropathy, unspecified: Secondary | ICD-10-CM

## 2015-10-16 MED ORDER — INSULIN PEN NEEDLE 31G X 5 MM MISC: Status: DC

## 2015-10-16 MED ORDER — CITALOPRAM HYDROBROMIDE 20 MG PO TABS: 20.0000 mg | ORAL_TABLET | Freq: Every day | ORAL | Status: DC

## 2015-10-16 MED ORDER — INSULIN GLARGINE 100 UNIT/ML SOLOSTAR PEN: PEN_INJECTOR | SUBCUTANEOUS | Status: DC

## 2015-10-16 MED ORDER — GLUCOSE BLOOD VI STRP: ORAL_STRIP | Status: DC

## 2015-10-17 ENCOUNTER — Other Ambulatory Visit: Payer: Self-pay

## 2015-10-17 MED ORDER — CYCLOBENZAPRINE HCL 10 MG PO TABS: 10.0000 mg | ORAL_TABLET | Freq: Two times a day (BID) | ORAL | Status: DC | PRN

## 2015-10-23 ENCOUNTER — Ambulatory Visit (HOSPITAL_COMMUNITY)
Admission: RE | Admit: 2015-10-23 | Discharge: 2015-10-23 | Disposition: A | Discharge status: Home / Self Care | Payer: Medicaid Other | Source: Ambulatory Visit | Attending: Internal Medicine | Admitting: Internal Medicine

## 2015-10-23 DIAGNOSIS — E119 Type 2 diabetes mellitus without complications: Secondary | ICD-10-CM | POA: Insufficient documentation

## 2015-10-23 DIAGNOSIS — E785 Hyperlipidemia, unspecified: Secondary | ICD-10-CM | POA: Insufficient documentation

## 2015-10-23 DIAGNOSIS — I509 Heart failure, unspecified: Secondary | ICD-10-CM | POA: Diagnosis not present

## 2015-10-23 DIAGNOSIS — I5032 Chronic diastolic (congestive) heart failure: Secondary | ICD-10-CM | POA: Diagnosis not present

## 2015-10-23 DIAGNOSIS — I517 Cardiomegaly: Secondary | ICD-10-CM | POA: Insufficient documentation

## 2015-10-23 DIAGNOSIS — I10 Essential (primary) hypertension: Secondary | ICD-10-CM | POA: Diagnosis not present

## 2015-10-23 NOTE — Progress Notes (Signed)
  Echocardiogram 2D Echocardiogram has been performed.  Arvil Chaco 10/23/2015, 11:51 AM

## 2015-10-28 ENCOUNTER — Encounter: Payer: Self-pay | Admitting: Physical Medicine & Rehabilitation

## 2015-10-28 ENCOUNTER — Encounter: Payer: Medicaid Other | Attending: Physical Medicine & Rehabilitation | Admitting: Physical Medicine & Rehabilitation

## 2015-10-28 VITALS — BP 175/90 | HR 83

## 2015-10-28 DIAGNOSIS — E1342 Other specified diabetes mellitus with diabetic polyneuropathy: Secondary | ICD-10-CM | POA: Diagnosis present

## 2015-10-28 DIAGNOSIS — Z5181 Encounter for therapeutic drug level monitoring: Secondary | ICD-10-CM | POA: Diagnosis present

## 2015-10-28 DIAGNOSIS — G629 Polyneuropathy, unspecified: Secondary | ICD-10-CM | POA: Diagnosis present

## 2015-10-28 DIAGNOSIS — G8110 Spastic hemiplegia affecting unspecified side: Secondary | ICD-10-CM | POA: Diagnosis not present

## 2015-10-28 DIAGNOSIS — M1712 Unilateral primary osteoarthritis, left knee: Secondary | ICD-10-CM | POA: Diagnosis not present

## 2015-10-28 DIAGNOSIS — I63319 Cerebral infarction due to thrombosis of unspecified middle cerebral artery: Secondary | ICD-10-CM | POA: Diagnosis present

## 2015-10-28 DIAGNOSIS — I5042 Chronic combined systolic (congestive) and diastolic (congestive) heart failure: Secondary | ICD-10-CM | POA: Diagnosis present

## 2015-10-28 DIAGNOSIS — M47817 Spondylosis without myelopathy or radiculopathy, lumbosacral region: Secondary | ICD-10-CM | POA: Diagnosis present

## 2015-10-28 DIAGNOSIS — Z79899 Other long term (current) drug therapy: Secondary | ICD-10-CM | POA: Diagnosis present

## 2015-10-28 DIAGNOSIS — E1142 Type 2 diabetes mellitus with diabetic polyneuropathy: Secondary | ICD-10-CM

## 2015-10-28 DIAGNOSIS — I63311 Cerebral infarction due to thrombosis of right middle cerebral artery: Secondary | ICD-10-CM

## 2015-10-28 DIAGNOSIS — G8114 Spastic hemiplegia affecting left nondominant side: Secondary | ICD-10-CM

## 2015-10-28 MED ORDER — OXYCODONE-ACETAMINOPHEN 10-325 MG PO TABS: 1.0000 | ORAL_TABLET | Freq: Three times a day (TID) | ORAL | Status: DC | PRN

## 2015-10-28 NOTE — Patient Instructions (Signed)
PLEASE CALL ME WITH ANY PROBLEMS OR QUESTIONS (#[phone]). HAVE A HAPPY HOLIDAY SEASON!!!    

## 2015-10-28 NOTE — Progress Notes (Signed)
After informed consent and preparation of the skin with betadine and isopropyl alcohol, 6 CC SYNVISC-ONE  into the left knee via antero-lateral approach. Additionally, aspiration was performed prior to injection. The patient tolerated well, and no complications were encountered. Afterward the area was cleaned and dressed. Post- injection instructions were provided. Percocet was refilled.   Follow up in a month

## 2015-11-05 ENCOUNTER — Telehealth: Payer: Self-pay

## 2015-11-05 NOTE — Telephone Encounter (Signed)
Pt called and states that she has pain in her left foot that will not go away. Pt is diabetic and has a hx of peripheral neuropathy. She states that the pain comes and goes. It is a sharp, aching pain. She would like to know what she can do to relieve this. She has taken her Oxy today and she states that it did not touch the pain. Please advise. Thanks!

## 2015-11-10 ENCOUNTER — Other Ambulatory Visit: Payer: Self-pay

## 2015-11-10 ENCOUNTER — Encounter: Payer: Self-pay | Admitting: *Deleted

## 2015-11-10 DIAGNOSIS — K5901 Slow transit constipation: Secondary | ICD-10-CM

## 2015-11-10 NOTE — Telephone Encounter (Signed)
Please call patient and have her make an appointment to be seen. Please and thank you.

## 2015-11-10 NOTE — Telephone Encounter (Signed)
Not seeing the foot, it's difficult to say what's going on. She will need to be seen here or by another provider to figure out what's going on.

## 2015-11-11 ENCOUNTER — Other Ambulatory Visit (HOSPITAL_COMMUNITY): Payer: Self-pay | Admitting: *Deleted

## 2015-11-11 DIAGNOSIS — B372 Candidiasis of skin and nail: Secondary | ICD-10-CM

## 2015-11-11 MED ORDER — POLYETHYLENE GLYCOL 3350 17 G PO PACK: 17.0000 g | PACK | Freq: Every day | ORAL | Status: DC

## 2015-11-11 MED ORDER — NYSTATIN 100000 UNIT/GM EX POWD: CUTANEOUS | Status: DC

## 2015-11-11 MED ORDER — HYDRALAZINE HCL 50 MG PO TABS: 75.0000 mg | ORAL_TABLET | Freq: Three times a day (TID) | ORAL | Status: DC

## 2015-11-12 ENCOUNTER — Encounter (HOSPITAL_BASED_OUTPATIENT_CLINIC_OR_DEPARTMENT_OTHER): Payer: Medicaid Other | Admitting: Registered Nurse

## 2015-11-12 ENCOUNTER — Encounter: Payer: Self-pay | Admitting: Registered Nurse

## 2015-11-12 VITALS — BP 144/98 | HR 75 | Resp 16

## 2015-11-12 DIAGNOSIS — M79672 Pain in left foot: Secondary | ICD-10-CM | POA: Diagnosis not present

## 2015-11-12 DIAGNOSIS — M7918 Myalgia, other site: Secondary | ICD-10-CM

## 2015-11-12 DIAGNOSIS — M4716 Other spondylosis with myelopathy, lumbar region: Secondary | ICD-10-CM | POA: Diagnosis not present

## 2015-11-12 DIAGNOSIS — G8114 Spastic hemiplegia affecting left nondominant side: Secondary | ICD-10-CM

## 2015-11-12 DIAGNOSIS — Z79899 Other long term (current) drug therapy: Secondary | ICD-10-CM

## 2015-11-12 DIAGNOSIS — M25511 Pain in right shoulder: Secondary | ICD-10-CM | POA: Diagnosis not present

## 2015-11-12 DIAGNOSIS — G8110 Spastic hemiplegia affecting unspecified side: Secondary | ICD-10-CM | POA: Diagnosis not present

## 2015-11-12 DIAGNOSIS — I63311 Cerebral infarction due to thrombosis of right middle cerebral artery: Secondary | ICD-10-CM

## 2015-11-12 DIAGNOSIS — M791 Myalgia: Secondary | ICD-10-CM

## 2015-11-12 DIAGNOSIS — Z5181 Encounter for therapeutic drug level monitoring: Secondary | ICD-10-CM

## 2015-11-12 DIAGNOSIS — E1142 Type 2 diabetes mellitus with diabetic polyneuropathy: Secondary | ICD-10-CM

## 2015-11-12 MED ORDER — OXYCODONE-ACETAMINOPHEN 10-325 MG PO TABS: 1.0000 | ORAL_TABLET | Freq: Three times a day (TID) | ORAL | Status: DC | PRN
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
## 2015-11-12 NOTE — Progress Notes (Signed)
Urine drug screen for this encounter is Inconsistent for prescribed medication

## 2015-11-20 ENCOUNTER — Telehealth (HOSPITAL_COMMUNITY): Payer: Self-pay

## 2015-11-20 NOTE — Telephone Encounter (Signed)
Patient called CHF clinic wanting further info about recent echo results and decline in EF.  Advised that this would be discussed at her upcoming apt.  Ave Filter

## 2015-11-25 ENCOUNTER — Encounter (HOSPITAL_COMMUNITY): Payer: Self-pay | Admitting: Internal Medicine

## 2015-11-25 ENCOUNTER — Ambulatory Visit (HOSPITAL_COMMUNITY)
Admission: RE | Admit: 2015-11-25 | Discharge: 2015-11-25 | Disposition: A | Discharge status: Home / Self Care | Payer: Medicaid Other | Source: Ambulatory Visit | Attending: Internal Medicine | Admitting: Internal Medicine

## 2015-11-25 VITALS — BP 142/94 | HR 79 | Wt 264.5 lb

## 2015-11-25 DIAGNOSIS — E559 Vitamin D deficiency, unspecified: Secondary | ICD-10-CM | POA: Diagnosis not present

## 2015-11-25 DIAGNOSIS — I5032 Chronic diastolic (congestive) heart failure: Secondary | ICD-10-CM | POA: Diagnosis not present

## 2015-11-25 DIAGNOSIS — Z01810 Encounter for preprocedural cardiovascular examination: Secondary | ICD-10-CM

## 2015-11-25 DIAGNOSIS — I10 Essential (primary) hypertension: Secondary | ICD-10-CM

## 2015-11-25 LAB — COMPREHENSIVE METABOLIC PANEL
ALBUMIN: 3.9 g/dL (ref 3.5–5.0)
ALK PHOS: 58 U/L (ref 38–126)
ALT: 11 U/L — ABNORMAL LOW (ref 14–54)
ANION GAP: 8 (ref 5–15)
AST: 15 U/L (ref 15–41)
BUN: 6 mg/dL (ref 6–20)
CALCIUM: 9.1 mg/dL (ref 8.9–10.3)
CHLORIDE: 104 mmol/L (ref 101–111)
CO2: 25 mmol/L (ref 22–32)
Creatinine, Ser: 0.73 mg/dL (ref 0.44–1.00)
GFR calc Af Amer: >60 mL/min (ref >60)
GFR calc non Af Amer: >60 mL/min (ref >60)
GLUCOSE: 146 mg/dL — AB (ref 65–99)
Potassium: 3.8 mmol/L (ref 3.5–5.1)
SODIUM: 137 mmol/L (ref 135–145)
Total Bilirubin: 1 mg/dL (ref 0.3–1.2)
Total Protein: 7.3 g/dL (ref 6.5–8.1)

## 2015-11-25 LAB — CHOLESTEROL, TOTAL: Cholesterol: 173 mg/dL (ref 0–200)

## 2015-11-25 MED ORDER — SPIRONOLACTONE 25 MG PO TABS: 25.0000 mg | ORAL_TABLET | Freq: Every day | ORAL | Status: DC

## 2015-11-25 MED ORDER — DIGOXIN 125 MCG PO TABS: 0.1250 mg | ORAL_TABLET | Freq: Every day | ORAL | Status: DC

## 2015-11-25 MED ORDER — POTASSIUM CHLORIDE ER 20 MEQ PO TBCR
20.0000 meq | EXTENDED_RELEASE_TABLET | Freq: Every day | ORAL | Status: DC
Start: 2015-11-25 — End: 2016-04-29

## 2015-11-25 MED ORDER — VITAMIN D (ERGOCALCIFEROL) 1.25 MG (50000 UNIT) PO CAPS: 50000.0000 [IU] | ORAL_CAPSULE | ORAL | Status: DC

## 2015-11-25 NOTE — Patient Instructions (Addendum)
START Spironolactone 25mg , one tab daily CHANGE Potassium 20 MEQ, one tab only on days you take LASIX  Labs toady and again in 2 weeks (BMET)  Your physician recommends that you schedule a follow-up appointment in: 4 weeks with Dr.McLean  Do the following things EVERYDAY: 1) Weigh yourself in the morning before breakfast. Write it down and keep it in a log. 2) Take your medicines as prescribed 3) Eat low salt foods-Limit salt (sodium) to 2000 mg per day.  4) Stay as active as you can everyday 5) Limit all fluids for the day to less than 2 liters 6)

## 2015-11-25 NOTE — Progress Notes (Signed)
Patient ID: ELARA COCKE, female   DOB: 1966-03-11, 49 y.o.   MRN: [id number]    Advanced Heart Failure Clinic Note   EP: Dr Caryl Comes PCP: Dr [facility] Primary HF Cardiologist: Dr Aundra Dubin   Angioedema with ACE-I  --Optivol--  HPI: 49 yo with history of morbid obesity, HTN, HLD, CAD, DM2, OSA on CPAP, CVA and combined CHF in the setting of poorly-controlled HTN. She was admitted in 05/2011 with chest pain and new LBBB and ended up getting a left heart cath, which showed no angiographic CAD. Renal artery dopplers were normal in 04/2012.  She has a Medtronic CRT-D device.   Was hospitalized May 2014 with acute on chronic combined systolic and diastolic CHF. EF from December 2013 was down to 40 to 45%.   Admitted in January 2015 with slurred speech, L arm weakness and facial droop. Initial head CT showed no acute abnormality. Repeat head CT showed subtle gray white matter differentiation in R frontal lobe and R operculum perhaps representing ischemic R sided MCA. STAT repeat CT head later on 1/29 showed further evolution of R MCA territory infarct involving R insular cortex and operculum. No hemorrhage. Neurology recommended CTA head/neck, which showed occlusion of MCA branch to the area of R MCA infarct and a normal neck CTA. TEE was done and did not reveal embolic source. Was discharged to inpatient rehab.  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  She was started on Keppra.  She went to the ER in 2/16 with syncope that was probably due to seizure, no arrhythmias with device interrogation.  Today she returns for HF follow up/add on for echo with reduced EF. Weight down 4 lbs since last visit. Feeling good. Denies any SOB or CP. Chronic lightheadedness/dizziness, worse after taking medicines. Still getting around with a cane. Feels like she gets tired faster moving around in wheelchair. Takes power nap and feels better. No orthopnea.  Has  residual left-sided weakness. Taking medications but had run out of digoxin and vitamin D. Only wearing CPAP 1x/week.   Optivol: Fluid status elevated above threshold, impedence trending down. Activity < 1 hr per day.   ECHO 11/2012: 40-45% ECHO  07/2013: 20% RV normal ECHO    08/23/2013: EF 40-45%, diff HK ECHO   09/05/2013: EF 25-30%, diff HK  ECHO  12/11/13: EF 30%, diff HK, RV normal ECHO 01/14/2014: EF 35-40%, LA mod dilated  ECHO 4/16: EF 40-45%, moderate LVH, normal RV size with mildly decreased systolic function.    Labs 06/2013: proBNP 230, Cr 0.9, K 3.7          08/2013: K+ 3.9, Cr 0.9       09/2013: K 4.2, Cr 0.86, pro-BNP 718, dig level <0.3     10/07/13: K+ 4.1, Cr 0.83       05/08/14: K 4.0 Creatinine 0.67 Pro BNP 233 Dig level 0.3        08/20/2014 K 3.8 nCreatinine 0.56        2/16: K 3.6, creatinine 0.95, LDL 106, HDL 43       3/16: K 3.5, creatinine 0.87, HCT 35.9       7/16: K 3.5, creatinine 0.78. HCT 37  ROS: All systems negative except as listed in HPI, PMH and Problem List.  Past Medical History  Diagnosis Date  . Hypertension     Poorly controlled. Has had HTN since age 17. Angioedema with ACEI.  24  Hr urine and renal arterial dopplers ordered . . . Never done  . NICM (nonischemic cardiomyopathy) ([place])     EF 45-50% in 8/12, cath 6/12 showed normal coronaries, EF 50-55% by LV gram  . Morbid obesity ([place])   . Allergic rhinitis   . HLD (hyperlipidemia)   . OSA on CPAP     sleep study in 8/12 showed moderate to severe OSA requiring CPAP  . Chronic lower back pain     secondary to DJD, obsetiy, hip problems. Followed by Dr. Oval Linsey (pain management)  . Chronic diastolic heart failure (HCC)      Primarily diastolic CHF: Likely due to uncontrolled HTN. Last echo (8/12) with EF 45-50%, mild to moderate LVH with some asymmetric septal hypertrophy, RV normal size and systolic function. EF 50-55% by LV-gram in 6/12.   . Polyneuropathy in diabetes(357.2)   . Thoracic or  lumbosacral neuritis or radiculitis, unspecified   . Calcifying tendinitis of shoulder   . LBBB (left bundle branch block)   . Chronic combined systolic and diastolic CHF (congestive heart failure) ([place])     EF 40-45% by echo 12/06/2012  . Coronary artery disease     questionable. LHC 05/2011 showing normal coronaries // Followed at [facility], Dr. Aundra Dubin  . GERD (gastroesophageal reflux disease)   . Shortness of breath   . Asthma   . Automatic implantable cardioverter-defibrillator in situ   . Stroke [facility]) 12/2013    "my left side is paralyzed" (07/04/2014)  . Type II diabetes mellitus ([place]) DX: 2002  . 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 Degeneration of lumbar or lumbosacral intervertebral disc   . Arthritis     "hips, back, legs, arms" (07/04/2014)  . Frequent UTI   . Liver disease     Current Outpatient Prescriptions  Medication Sig Dispense Refill  . ACCU-CHEK FASTCLIX LANCETS MISC Use 1-2 times daily when checking blood sugar. ICD10: E11.9 102 each 11  . amLODipine (NORVASC) 10 MG tablet Take 1 tablet (10 mg total) by mouth daily. 90 tablet 1  . aspirin 325 MG tablet Take 1 tablet (325 mg total) by mouth daily. 30 tablet 5  . atorvastatin (LIPITOR) 40 MG tablet Take 1 tablet (40 mg total) by mouth daily. 90 tablet 3  . Blood Glucose Monitoring Suppl (ACCU-CHEK AVIVA PLUS) W/DEVICE KIT 1 each by Does not apply route 3 (three) times daily. 1 kit 0  . citalopram (CELEXA) 20 MG tablet Take 1 tablet (20 mg total) by mouth daily. 90 tablet 1  . cyclobenzaprine (FLEXERIL) 10 MG tablet Take 1 tablet (10 mg total) by mouth 2 (two) times daily as needed for muscle spasms. 30 tablet 1  . diclofenac sodium (VOLTAREN) 1 % GEL Apply 2 g topically 4 (four) times daily as needed (pain). 3 Tube 5  . docusate sodium (COLACE) 50 MG capsule Take 1 capsule (50 mg total) by mouth 2 (two) times daily as needed for mild constipation. 60 capsule 6  . fluticasone (CUTIVATE)  0.05 % cream Apply topically 2 (two) times daily. Dispense two tubes 30 g 6  . furosemide (LASIX) 40 MG tablet Take 1 tablet (40 mg total) by mouth 2 (two) times a week. 10 tablet 6  . glucose blood (ACCU-CHEK SMARTVIEW) test strip Use to Check Blood Sugar TID. Code: E11.40. 100 each 12  . hydrALAZINE (APRESOLINE) 50 MG tablet Take 1.5 tablets (75 mg total) by mouth 3 (three) times daily.  270 tablet 3  . ibuprofen (ADVIL,MOTRIN) 200 MG tablet Take 200 mg by mouth every 6 (six) hours as needed for moderate pain.    . Incontinence Supplies MISC 1 year supply of Pull-ups in size XL Diagnosis Code: 788.33 1 each 0  . Insulin Glargine (LANTUS SOLOSTAR) 100 UNIT/ML Solostar Pen Inject 10 units in the AM, 35 units in the PM 45 mL 3  . Insulin Pen Needle (GLOBAL EASE INJECT PEN NEEDLES) 31G X 5 MM MISC Use to inject insulin BID. Code: E 11.40 100 each 10  . ipratropium (ATROVENT) 0.06 % nasal spray Place 2 sprays into both nostrils 4 (four) times daily. 15 mL 1  . isosorbide mononitrate (IMDUR) 60 MG 24 hr tablet Take 1 tablet (60 mg total) by mouth daily. 90 tablet 3  . levETIRAcetam (KEPPRA) 500 MG tablet Take 1 tablet (500 mg total) by mouth 2 (two) times daily. 60 tablet 5  . metFORMIN (GLUCOPHAGE) 1000 MG tablet Take 1 tablet (1,000 mg total) by mouth daily with breakfast. 90 tablet 3  . metoprolol succinate (TOPROL-XL) 100 MG 24 hr tablet Take 1 tablet (100 mg total) by mouth 2 (two) times daily. 60 tablet 0  . nystatin (MYCOSTATIN/NYSTOP) 100000 UNIT/GM POWD Apply to affected area twice daily until healed. 30 g 0  . pantoprazole (PROTONIX) 40 MG tablet Take 40 mg by mouth daily.    . polyethylene glycol (MIRALAX) packet Take 17 g by mouth daily. 14 each 1  . Potassium Chloride ER 20 MEQ TBCR Take 20 mEq by mouth daily. 90 tablet 3  . solifenacin (VESICARE) 10 MG tablet Take 10 mg by mouth daily.    . Vitamin D, Ergocalciferol, (DRISDOL) 50000 UNITS CAPS capsule Take 1 capsule (50,000 Units total)  by mouth every 7 (seven) days. (Patient taking differently: Take 50,000 Units by mouth every Monday. ) 24 capsule 0  . digoxin (LANOXIN) 0.125 MG tablet Take 1 tablet (0.125 mg total) by mouth daily. (Patient not taking: Reported on 11/25/2015) 30 tablet 5  . oxyCODONE-acetaminophen (PERCOCET) 10-325 MG tablet Take 1 tablet by mouth every 8 (eight) hours as needed for pain. (Patient not taking: Reported on 11/25/2015) 90 tablet 0   No current facility-administered medications for this encounter.   Filed Vitals:   11/25/15 1118  BP: 142/94  Pulse: 79  Weight: 264 lb 8 oz (119.976 kg)  SpO2: 99%    Wt Readings from Last 3 Encounters:  11/25/15 264 lb 8 oz (119.976 kg)  09/22/15 268 lb 8 oz (121.791 kg)  09/09/15 268 lb 11.2 oz (121.882 kg)     PHYSICAL EXAM: General:  NAD. No resp difficulty; Neck: supple. Thick. JVP 7 cm. Carotids 2+ bilaterally; no bruits. Thyroid full.  Cor: PMI nonpalpable; RRR. No rubs, gallops or murmurs. No S3/S4.  Lungs: clear Abdomen: obese, soft, NT, ND, no HSM. No bruits or masses. +BS  Extremities: no cyanosis, clubbing, rash, Trace edema.  Neuro: alert & orientedx3, cranial nerves grossly intact. Moves all 4 extremities w/o difficulty. Affect pleasant.  ASSESSMENT: 1. Chronic systolic HF: Nonischemic cardiomyopathy, normal coronaries on 6/12 angiography.  CMP may be due to HTN.  Echo 03/2014 EF 40-45% Echo 11/16 EF 25-30%.  S/P CRT-D Medtronic.  NYHA class II symptoms, volume status mildly up on exam and by optivol.  - Continue Lasix twice weekly. Should take for the next 2 days in a row. Start spiro $RemoveBef'25mg'ekUpWcqDCO$  daily. Will check CMET today.   - Continue current dose of Toprol  XL. Continue hydralazine 75 mg tid and Imdur 60 mg daily.   - Continue digoxin. Needs refill.  - No ACE-I due to angioedema.   - Echo 10/23/15 showed EF 25-30% decreased from 40-45% previous.   - Reinforced the need and importance of daily weights, a low sodium diet, and fluid restriction  (less than 2 L a day). Instructed to call the HF clinic if weight increases more than 3 lbs overnight or 5 lbs in a week.  2. HTN: BP elevated today. Does not have a way to check at home. As above, add spiro. 3. Morbid obesity: Exercise limited. Weight down from last visit.  4. OSA on CPAP: Only using once a week.  Needs to try and use everynight.  5. CVA:  Continue ASA, Changed to atorvastatin last visit.  Check lipids today, goal LDL < 70.  6. Shoulder surgery - pre-op CV examination - Pt is to have R shoulder surgery with Dr Sharol Given.  She would be high risk for general anasthesia, though not prohibitive with her reduced EF. If nerve block is an option would be preferred.   Follow up in 4 weeks.   Shirley Friar PA-C 11/25/2015  Patient seen and examined with Oda Kilts, PA-C. We discussed all aspects of the encounter. I agree with the assessment and plan as stated above.   EF back down on recent echo. perhaps due to HTN. Volume status elevated on exam and by optivol. Will increase lasix x 2 days and add spiro for volume and HTN. Check BMET next week. She is at high-risk for peri-op complications due to her HF and obesity. Risk is not prohibitive but would prefer nerve block with conscious sedation if that is an option.   Bensimhon, Daniel,MD 11:02 PM

## 2015-11-27 ENCOUNTER — Ambulatory Visit: Payer: Medicaid Other | Admitting: Registered Nurse

## 2015-12-03 ENCOUNTER — Encounter: Payer: Self-pay | Admitting: Internal Medicine

## 2015-12-03 ENCOUNTER — Ambulatory Visit (INDEPENDENT_AMBULATORY_CARE_PROVIDER_SITE_OTHER): Payer: Medicaid Other | Admitting: *Deleted

## 2015-12-03 DIAGNOSIS — I447 Left bundle-branch block, unspecified: Secondary | ICD-10-CM

## 2015-12-03 DIAGNOSIS — I5042 Chronic combined systolic (congestive) and diastolic (congestive) heart failure: Secondary | ICD-10-CM

## 2015-12-03 DIAGNOSIS — I428 Other cardiomyopathies: Secondary | ICD-10-CM

## 2015-12-03 DIAGNOSIS — I429 Cardiomyopathy, unspecified: Secondary | ICD-10-CM | POA: Diagnosis not present

## 2015-12-03 LAB — CUP PACEART INCLINIC DEVICE CHECK
Battery Remaining Longevity: 64 mo
Brady Statistic AP VP Percent: 0.01 %
Brady Statistic AS VS Percent: 1.66 %
Brady Statistic RA Percent Paced: 0.02 %
Brady Statistic RV Percent Paced: 36.47 %
Date Time Interrogation Session: 20161221164553
HighPow Impedance: 53 Ohm
Implantable Lead Implant Date: 20140910
Implantable Lead Location: 753858
Implantable Lead Location: 753859
Implantable Lead Model: 4298
Implantable Lead Model: 5076
Lead Channel Impedance Value: 304 Ohm
Lead Channel Impedance Value: 304 Ohm
Lead Channel Impedance Value: 361 Ohm
Lead Channel Impedance Value: 361 Ohm
Lead Channel Impedance Value: 418 Ohm
Lead Channel Impedance Value: 532 Ohm
Lead Channel Impedance Value: 532 Ohm
Lead Channel Pacing Threshold Amplitude: 0.875 V
Lead Channel Pacing Threshold Amplitude: 1 V
Lead Channel Pacing Threshold Pulse Width: 0.4 ms
Lead Channel Sensing Intrinsic Amplitude: 16.25 mV
Lead Channel Setting Pacing Amplitude: 2 V
Lead Channel Setting Pacing Pulse Width: 0.4 ms
Lead Channel Setting Sensing Sensitivity: 0.3 mV
MDC IDC LEAD IMPLANT DT: 20140910
MDC IDC LEAD IMPLANT DT: 20140910
MDC IDC LEAD LOCATION: 753860
MDC IDC LEAD MODEL: 6935
MDC IDC MSMT BATTERY VOLTAGE: 2.98 V
MDC IDC MSMT LEADCHNL LV IMPEDANCE VALUE: 304 Ohm
MDC IDC MSMT LEADCHNL LV IMPEDANCE VALUE: 304 Ohm
MDC IDC MSMT LEADCHNL LV IMPEDANCE VALUE: 532 Ohm
MDC IDC MSMT LEADCHNL LV IMPEDANCE VALUE: 532 Ohm
MDC IDC MSMT LEADCHNL LV IMPEDANCE VALUE: 532 Ohm
MDC IDC MSMT LEADCHNL LV PACING THRESHOLD PULSEWIDTH: 0.4 ms
MDC IDC MSMT LEADCHNL RA PACING THRESHOLD AMPLITUDE: 0.375 V
MDC IDC MSMT LEADCHNL RA SENSING INTR AMPL: 3.875 mV
MDC IDC MSMT LEADCHNL RV IMPEDANCE VALUE: 399 Ohm
MDC IDC MSMT LEADCHNL RV PACING THRESHOLD PULSEWIDTH: 0.4 ms
MDC IDC SET LEADCHNL LV PACING AMPLITUDE: 2 V
MDC IDC STAT BRADY AP VS PERCENT: 0.01 %
MDC IDC STAT BRADY AS VP PERCENT: 98.32 %

## 2015-12-03 NOTE — Progress Notes (Signed)
CRT-D device check in office. Thresholds and sensing consistent with previous device measurements. Lead impedance trends stable over time. No mode switch episodes recorded. 2 NSVT episodes recorded, longest ~9sec, 31/36bts in VT detection zone, avg V 214bpm. Patient bi-ventricularly pacing 98.2% of the time. Device programmed with appropriate safety margins. Heart failure diagnostics reviewed and trends are unstable for patient, followed by HF Clinic, spironolactone prescribed at appt on 11/25/15. Audible alerts demonstrated for patient. No changes made this session. Estimated longevity 5.3 years. Patient enrolled in remote follow up. Patient education completed including shock plan. ROV with SK on 03/01/16 at 9:30am.

## 2015-12-05 DIAGNOSIS — Z01810 Encounter for preprocedural cardiovascular examination: Secondary | ICD-10-CM | POA: Insufficient documentation

## 2015-12-10 ENCOUNTER — Other Ambulatory Visit: Payer: Self-pay

## 2015-12-10 DIAGNOSIS — B372 Candidiasis of skin and nail: Secondary | ICD-10-CM

## 2015-12-10 DIAGNOSIS — F32A Depression, unspecified: Secondary | ICD-10-CM

## 2015-12-10 DIAGNOSIS — F329 Major depressive disorder, single episode, unspecified: Secondary | ICD-10-CM

## 2015-12-10 MED ORDER — CYCLOBENZAPRINE HCL 10 MG PO TABS: 10.0000 mg | ORAL_TABLET | Freq: Two times a day (BID) | ORAL | Status: DC | PRN

## 2015-12-11 ENCOUNTER — Encounter: Payer: Medicaid Other | Attending: Physical Medicine & Rehabilitation | Admitting: Registered Nurse

## 2015-12-11 ENCOUNTER — Encounter: Payer: Self-pay | Admitting: Registered Nurse

## 2015-12-11 ENCOUNTER — Other Ambulatory Visit: Payer: Self-pay | Admitting: Registered Nurse

## 2015-12-11 VITALS — BP 165/114 | HR 88 | Resp 16

## 2015-12-11 DIAGNOSIS — M791 Myalgia: Secondary | ICD-10-CM

## 2015-12-11 DIAGNOSIS — M47817 Spondylosis without myelopathy or radiculopathy, lumbosacral region: Secondary | ICD-10-CM | POA: Diagnosis present

## 2015-12-11 DIAGNOSIS — E1342 Other specified diabetes mellitus with diabetic polyneuropathy: Secondary | ICD-10-CM | POA: Insufficient documentation

## 2015-12-11 DIAGNOSIS — G629 Polyneuropathy, unspecified: Secondary | ICD-10-CM | POA: Diagnosis present

## 2015-12-11 DIAGNOSIS — Z5181 Encounter for therapeutic drug level monitoring: Secondary | ICD-10-CM | POA: Diagnosis present

## 2015-12-11 DIAGNOSIS — M25511 Pain in right shoulder: Secondary | ICD-10-CM | POA: Diagnosis not present

## 2015-12-11 DIAGNOSIS — M4716 Other spondylosis with myelopathy, lumbar region: Secondary | ICD-10-CM

## 2015-12-11 DIAGNOSIS — I63319 Cerebral infarction due to thrombosis of unspecified middle cerebral artery: Secondary | ICD-10-CM | POA: Diagnosis present

## 2015-12-11 DIAGNOSIS — I5042 Chronic combined systolic (congestive) and diastolic (congestive) heart failure: Secondary | ICD-10-CM | POA: Diagnosis present

## 2015-12-11 DIAGNOSIS — Z79899 Other long term (current) drug therapy: Secondary | ICD-10-CM

## 2015-12-11 DIAGNOSIS — G8114 Spastic hemiplegia affecting left nondominant side: Secondary | ICD-10-CM | POA: Diagnosis not present

## 2015-12-11 DIAGNOSIS — E1142 Type 2 diabetes mellitus with diabetic polyneuropathy: Secondary | ICD-10-CM

## 2015-12-11 DIAGNOSIS — G8110 Spastic hemiplegia affecting unspecified side: Secondary | ICD-10-CM | POA: Diagnosis not present

## 2015-12-11 DIAGNOSIS — M7918 Myalgia, other site: Secondary | ICD-10-CM

## 2015-12-11 DIAGNOSIS — I63311 Cerebral infarction due to thrombosis of right middle cerebral artery: Secondary | ICD-10-CM

## 2015-12-11 MED ORDER — OXYCODONE-ACETAMINOPHEN 10-325 MG PO TABS
1.0000 | ORAL_TABLET | Freq: Three times a day (TID) | ORAL | Status: DC | PRN
Start: 2015-12-11 — End: 2016-01-07

## 2015-12-11 NOTE — Progress Notes (Signed)
Subjective:    Patient ID: [name], female    DOB: August 16, 1966, 49 y.o.   MRN: [id number]  HPI: [name] is a 49 year old female who returns for follow up for chronic pain and medication refill. She says her pain is located in her right shoulder, lower back and left knee. She rates her pain 5. Her current exercise regime is performing stretching exercises daily and walking with her 4 prong cane. Arrived in wheelchair.  Also arived  hypertensive blood pressure re-checked several times, [name] states she's complaint with her antihypertensive's she had her medications at 10: 00 am. She refuses ED evaluation, educated on medication compliance and schedule treatments. Blood pressure 176/109.  Pain Inventory Average Pain 5 Pain Right Now 5 My pain is dull, stabbing and aching  In the last 24 hours, has pain interfered with the following? General activity 4 Relation with others 4 Enjoyment of life 0 What TIME of day is your pain at its worst? daytime, evening Sleep (in general) Fair  Pain is worse with: NA Pain improves with: NA Relief from Meds: NA  Mobility use a cane  Function not employed: date last employed NA disabled: date disabled NA  Neuro/Psych bladder control problems weakness  Prior Studies Any changes since last visit?  no  Physicians involved in your care Any changes since last visit?  no   Family History  Problem Relation Age of Onset  . Heart disease Mother 83    Died of MI at age 40 yo  . Heart disease Paternal Grandmother     requiring pacemaker.  [name] Heart disease Paternal Grandfather 58    Died of MI at possibly age 31-53yo  . Stroke Paternal Grandfather   . Heart disease Father 68    MI age 11yo requiring stenting  . Kidney disease Mother     requiring dialysis  . Congestive Heart Failure Mother   . Diabetes Father   . Diabetes Brother   . Heart disease Brother 58    MI at age 64 years old  . Breast cancer Paternal Aunt    . Breast cancer Maternal Grandmother   . Glaucoma Father    Social History   Social History  . Marital Status: Single    Spouse Name: N/A  . Number of Children: 2  . Years of Education: 9th grade   Occupational History  . Unemployed     planning on getting disability   Social History Main Topics  . Smoking status: Never Smoker   . Smokeless tobacco: Never Used  . Alcohol Use: No  . Drug Use: No  . Sexual Activity:    Partners: Male    Birth Control/ Protection: Surgical   Other Topics Concern  . None   Social History Narrative   Lives in [place] with her son. Is able to read and write fluently in [place].   Past Surgical History  Procedure Laterality Date  . Tubal ligation  05/31/1985  . Carpal tunnel release Left   . Hemiarthroplasty shoulder fracture Right 1980's  . Multiple tooth extractions  ~ 2011    tumors removed ; "my whole top"  . Cardiac catheterization  05/2011  . Tee without cardioversion N/A 01/14/2014    Procedure: TRANSESOPHAGEAL ECHOCARDIOGRAM (TEE);  Surgeon: Thurmon Fair, MD;  [facility] ENDOSCOPY;  Service: Cardiovascular;  Laterality: N/A;  . Breast surgery Bilateral 2011    patient reports benign results  . Bi-ventricular implantable cardioverter defibrillator  (  crt-d)  08/2013    [name] 08/23/2013  . Bi-ventricular implantable cardioverter defibrillator N/A 08/22/2013    Procedure: BI-VENTRICULAR IMPLANTABLE CARDIOVERTER DEFIBRILLATOR  (CRT-D);  Surgeon: [name], MD;  Location: [facility] CATH LAB;  Service: Cardiovascular;  Laterality: N/A;   Past Medical History  Diagnosis Date  . Hypertension     Poorly controlled. Has had HTN since age 45. Angioedema with ACEI.  24 Hr urine and renal arterial dopplers ordered . . . Never done  . NICM (nonischemic cardiomyopathy) (HCC)     EF 45-50% in 8/12, cath 6/12 showed normal coronaries, EF 50-55% by LV gram  . Morbid obesity (HCC)   . Allergic rhinitis   . HLD (hyperlipidemia)   . OSA on CPAP       sleep study in 8/12 showed moderate to severe OSA requiring CPAP  . Chronic lower back pain     secondary to DJD, obsetiy, hip problems. Followed by Dr. Ivory Broad (pain management)  . Chronic diastolic heart failure (HCC)      Primarily diastolic CHF: Likely due to uncontrolled HTN. Last echo (8/12) with EF 45-50%, mild to moderate LVH with some asymmetric septal hypertrophy, RV normal size and systolic function. EF 50-55% by LV-gram in 6/12.   . Polyneuropathy in diabetes(357.2)   . Thoracic or lumbosacral neuritis or radiculitis, unspecified   . Calcifying tendinitis of shoulder   . LBBB (left bundle branch block)   . Chronic combined systolic and diastolic CHF (congestive heart failure) (HCC)     EF 40-45% by echo 12/06/2012  . Coronary artery disease     questionable. LHC 05/2011 showing normal coronaries // Followed at [facility], Dr. Shirlee Latch  . GERD (gastroesophageal reflux disease)   . Shortness of breath   . Asthma   . Automatic implantable cardioverter-defibrillator in situ   . Stroke [facility]) 12/2013    "my left side is paralyzed" (07/04/2014)  . Type II diabetes mellitus (HCC) DX: 2002  . 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 Degeneration of lumbar or lumbosacral intervertebral disc   . Arthritis     "hips, back, legs, arms" (07/04/2014)  . Frequent UTI   . Liver disease    BP 165/114 mmHg  Pulse 88  Resp 16  SpO2 100%  LMP 10/23/2015  Opioid Risk Score:   Fall Risk Score:  `1  Depression screen PHQ 2/9  Depression screen [facility] 2/9 09/09/2015 09/02/2015 08/04/2015 07/07/2015 06/30/2015 06/11/2015 04/22/2015  Decreased Interest 0 0 0 0 0 0 0  Down, Depressed, Hopeless 0 0 0 0 0 0 0  PHQ - 2 Score 0 0 0 0 0 0 0  Altered sleeping - - - - - - -  Tired, decreased energy - - - - - - -  Change in appetite - - - - - - -  Feeling bad or failure about yourself  - - - - - - -  Trouble concentrating - - - - - - -  Moving slowly or fidgety/restless - -  - - - - -  Suicidal thoughts - - - - - - -  PHQ-9 Score - - - - - - -      Review of Systems  Constitutional:       Bladder control problems  Endocrine:       High blood sugar  Neurological: Positive for weakness.  All other systems reviewed and are negative.  Objective:   Physical Exam  Constitutional: She is oriented to person, place, and time. She appears well-developed and well-nourished.  HENT:  Head: Normocephalic and atraumatic.  Neck: Normal range of motion. Neck supple.  Cardiovascular: Normal rate and regular rhythm.   Pulmonary/Chest: Effort normal and breath sounds normal.  Musculoskeletal:  Normal Muscle Bulk and Muscle Testing Reveals:  Upper Extremities: Right: Full ROM and Muscle Strength 5/5 Left: Hemiparesis Lumbar Paraspinal Tenderness: L-4- L-5 Lower Extremities: Right: Full ROM and Muscle strength 5/5 Left: Hemiparesis  Left AFO intact Arrived in wheelchair  Neurological: She is alert and oriented to person, place, and time.  Skin: Skin is warm and dry.  Psychiatric: She has a normal mood and affect.  Nursing note and vitals reviewed.         Assessment & Plan:  1. Chronic low back pain: Continue Current Medication Regime. Refilled: oxyCODONE 10/325mg  one tablet every 8 hours as needed.#90.  2. Diabetic peripheral neuropathy: Continue to monitor  3. Recent Right MCA infarct: Continue To Monitor.Schedule for  Botox injection with Dr. Riley Kill. 4. OA: Continue Voltaren gel   20 minutes of face to face patient care time was spent during this visit. All questions were encouraged and answered.  F/U in 1 month

## 2015-12-12 ENCOUNTER — Ambulatory Visit (HOSPITAL_COMMUNITY)
Admission: RE | Admit: 2015-12-12 | Discharge: 2015-12-12 | Disposition: A | Discharge status: Home / Self Care | Payer: Medicaid Other | Source: Ambulatory Visit | Attending: Cardiology | Admitting: Cardiology

## 2015-12-12 DIAGNOSIS — I10 Essential (primary) hypertension: Secondary | ICD-10-CM | POA: Diagnosis not present

## 2015-12-12 LAB — PMP ALCOHOL METABOLITE (ETG): ETGU: NEGATIVE ng/mL

## 2015-12-12 LAB — BASIC METABOLIC PANEL
ANION GAP: 8 (ref 5–15)
BUN: 6 mg/dL (ref 6–20)
CO2: 27 mmol/L (ref 22–32)
Calcium: 9.1 mg/dL (ref 8.9–10.3)
Chloride: 103 mmol/L (ref 101–111)
Creatinine, Ser: 0.67 mg/dL (ref 0.44–1.00)
GFR calc Af Amer: >60 mL/min (ref >60)
GLUCOSE: 158 mg/dL — AB (ref 65–99)
POTASSIUM: 4.1 mmol/L (ref 3.5–5.1)
Sodium: 138 mmol/L (ref 135–145)

## 2015-12-13 LAB — OPIATES/OPIOIDS (LC/MS-MS)
CODEINE URINE: NEGATIVE ng/mL (ref <50)
Hydrocodone: NEGATIVE ng/mL (ref <50)
Hydromorphone: NEGATIVE ng/mL (ref <50)
MORPHINE: 251 ng/mL — AB (ref <50)
NORHYDROCODONE, UR: NEGATIVE ng/mL (ref <50)
NOROXYCODONE, UR: NEGATIVE ng/mL — AB (ref <50)
OXYCODONE, UR: NEGATIVE ng/mL — AB (ref <50)
OXYMORPHONE, URINE: NEGATIVE ng/mL — AB (ref <50)

## 2015-12-17 LAB — PRESCRIPTION MONITORING PROFILE (SOLSTAS)
AMPHETAMINE/METH: NEGATIVE ng/mL
BARBITURATE SCREEN, URINE: NEGATIVE ng/mL
BENZODIAZEPINE SCREEN, URINE: NEGATIVE ng/mL
BUPRENORPHINE, URINE: NEGATIVE ng/mL
Cannabinoid Scrn, Ur: NEGATIVE ng/mL
Carisoprodol, Urine: NEGATIVE ng/mL
Cocaine Metabolites: NEGATIVE ng/mL
Creatinine, Urine: 229.25 mg/dL (ref >20.0)
Fentanyl, Ur: NEGATIVE ng/mL
MDMA URINE: NEGATIVE ng/mL
METHADONE SCREEN, URINE: NEGATIVE ng/mL
Meperidine, Ur: NEGATIVE ng/mL
NITRITES URINE, INITIAL: NEGATIVE ug/mL
OXYCODONE SCRN UR: NEGATIVE ng/mL
PROPOXYPHENE: NEGATIVE ng/mL
TAPENTADOLUR: NEGATIVE ng/mL
TRAMADOL UR: NEGATIVE ng/mL
Zolpidem, Urine: NEGATIVE ng/mL
pH, Initial: 5.6 pH (ref 4.5–8.9)

## 2015-12-19 ENCOUNTER — Telehealth: Payer: Self-pay | Admitting: Internal Medicine

## 2015-12-19 ENCOUNTER — Other Ambulatory Visit (HOSPITAL_COMMUNITY): Payer: Self-pay | Admitting: Orthopedic Surgery

## 2015-12-19 NOTE — Telephone Encounter (Signed)
[facility] requesting a refill on pt's medication Amlodipine 10 mg tablet. Please advise

## 2015-12-23 ENCOUNTER — Encounter (HOSPITAL_COMMUNITY)
Admission: RE | Admit: 2015-12-23 | Discharge: 2015-12-23 | Disposition: A | Discharge status: Home / Self Care | Payer: Medicaid Other | Source: Ambulatory Visit | Attending: Orthopedic Surgery | Admitting: Orthopedic Surgery

## 2015-12-23 ENCOUNTER — Encounter (HOSPITAL_COMMUNITY): Payer: Self-pay

## 2015-12-23 DIAGNOSIS — Z01812 Encounter for preprocedural laboratory examination: Secondary | ICD-10-CM | POA: Diagnosis not present

## 2015-12-23 DIAGNOSIS — I428 Other cardiomyopathies: Secondary | ICD-10-CM | POA: Insufficient documentation

## 2015-12-23 DIAGNOSIS — Z794 Long term (current) use of insulin: Secondary | ICD-10-CM | POA: Diagnosis not present

## 2015-12-23 DIAGNOSIS — Z79899 Other long term (current) drug therapy: Secondary | ICD-10-CM | POA: Diagnosis not present

## 2015-12-23 DIAGNOSIS — I5042 Chronic combined systolic (congestive) and diastolic (congestive) heart failure: Secondary | ICD-10-CM | POA: Diagnosis not present

## 2015-12-23 DIAGNOSIS — Z8673 Personal history of transient ischemic attack (TIA), and cerebral infarction without residual deficits: Secondary | ICD-10-CM | POA: Diagnosis not present

## 2015-12-23 DIAGNOSIS — K769 Liver disease, unspecified: Secondary | ICD-10-CM | POA: Insufficient documentation

## 2015-12-23 DIAGNOSIS — E119 Type 2 diabetes mellitus without complications: Secondary | ICD-10-CM | POA: Diagnosis not present

## 2015-12-23 DIAGNOSIS — Z7982 Long term (current) use of aspirin: Secondary | ICD-10-CM | POA: Insufficient documentation

## 2015-12-23 DIAGNOSIS — Z01818 Encounter for other preprocedural examination: Secondary | ICD-10-CM | POA: Diagnosis present

## 2015-12-23 DIAGNOSIS — G4733 Obstructive sleep apnea (adult) (pediatric): Secondary | ICD-10-CM | POA: Diagnosis not present

## 2015-12-23 DIAGNOSIS — M75101 Unspecified rotator cuff tear or rupture of right shoulder, not specified as traumatic: Secondary | ICD-10-CM | POA: Insufficient documentation

## 2015-12-23 DIAGNOSIS — R569 Unspecified convulsions: Secondary | ICD-10-CM | POA: Insufficient documentation

## 2015-12-23 DIAGNOSIS — K219 Gastro-esophageal reflux disease without esophagitis: Secondary | ICD-10-CM | POA: Insufficient documentation

## 2015-12-23 DIAGNOSIS — Z95810 Presence of automatic (implantable) cardiac defibrillator: Secondary | ICD-10-CM | POA: Diagnosis not present

## 2015-12-23 DIAGNOSIS — E785 Hyperlipidemia, unspecified: Secondary | ICD-10-CM | POA: Insufficient documentation

## 2015-12-23 DIAGNOSIS — I110 Hypertensive heart disease with heart failure: Secondary | ICD-10-CM | POA: Diagnosis not present

## 2015-12-23 HISTORY — DX: Unspecified convulsions: R56.9

## 2015-12-23 HISTORY — DX: Presence of cardiac pacemaker: Z95.0

## 2015-12-23 LAB — COMPREHENSIVE METABOLIC PANEL
ALBUMIN: 3.8 g/dL (ref 3.5–5.0)
ALK PHOS: 61 U/L (ref 38–126)
ALT: 12 U/L — ABNORMAL LOW (ref 14–54)
ANION GAP: 9 (ref 5–15)
AST: 15 U/L (ref 15–41)
BILIRUBIN TOTAL: 0.7 mg/dL (ref 0.3–1.2)
BUN: 8 mg/dL (ref 6–20)
CALCIUM: 9.2 mg/dL (ref 8.9–10.3)
CO2: 26 mmol/L (ref 22–32)
Chloride: 103 mmol/L (ref 101–111)
Creatinine, Ser: 0.77 mg/dL (ref 0.44–1.00)
GFR calc Af Amer: >60 mL/min (ref >60)
GLUCOSE: 199 mg/dL — AB (ref 65–99)
POTASSIUM: 3.6 mmol/L (ref 3.5–5.1)
Sodium: 138 mmol/L (ref 135–145)
TOTAL PROTEIN: 7.4 g/dL (ref 6.5–8.1)

## 2015-12-23 LAB — CBC
HEMATOCRIT: 38 % (ref 36.0–46.0)
HEMOGLOBIN: 12.3 g/dL (ref 12.0–15.0)
MCH: 27.3 pg (ref 26.0–34.0)
MCHC: 32.4 g/dL (ref 30.0–36.0)
MCV: 84.3 fL (ref 78.0–100.0)
Platelets: 213 10*3/uL (ref 150–400)
RBC: 4.51 MIL/uL (ref 3.87–5.11)
RDW: 12.8 % (ref 11.5–15.5)
WBC: 9.5 10*3/uL (ref 4.0–10.5)

## 2015-12-23 LAB — GLUCOSE, CAPILLARY: GLUCOSE-CAPILLARY: 223 mg/dL — AB (ref 65–99)

## 2015-12-23 LAB — APTT: aPTT: 31 seconds (ref 24–37)

## 2015-12-23 LAB — PROTIME-INR
INR: 1.18 (ref 0.00–1.49)
PROTHROMBIN TIME: 15.2 s (ref 11.6–15.2)

## 2015-12-23 NOTE — Pre-Procedure Instructions (Addendum)
JOLLY CARLINI  12/23/2015      [facility] - [place], [place] - [address] [address] Phone: [phone] Fax: [phone]  [facility] [address] [place], [place] - [address] [place] [address] Phone: [phone] Fax: [phone]    Your procedure is scheduled on Friday January 13th 2017   Report to [facility] Admitting at [phone] A.M.  Call this number if you have problems the morning of surgery:  [phone]   Remember:  Do not eat food or drink liquids after midnight Thursday January 12th.  Take these medicines the morning of surgery with A SIP OF WATER amlodipine (norvasc),- as needed, digoxin (lanoxin), , levetiracetam (keppra), metoprolol succinate (toprol-xl),isosorbide,hydalazine protonix (pantoprazole)atrivent if needed   ,STOP: ALL Vitamins, Supplements, Effient and Herbal Medications, Fish Oils, Aspirins, NSAIDs (Nonsteroidal Anti-inflammatories such as Ibuprofen, Aleve, or Advil), and Goody's/BC Powders   Starting today (12/22/15), until after surgery as directed by your physician.   no metformin am of surgery  How to Manage Your Diabetes Before Surgery   Why is it important to control my blood sugar before and after surgery?   Improving blood sugar levels before and after surgery helps healing and can limit problems.  A way of improving blood sugar control is eating a healthy diet by:  - Eating less sugar and carbohydrates  - Increasing activity/exercise  - Talk with your doctor about reaching your blood sugar goals  High blood sugars (greater than 180 mg/dL) can raise your risk of infections and slow down your recovery so you will need to focus on controlling your diabetes during the weeks before surgery.  Make sure that the doctor who takes care of your diabetes knows about your planned surgery including the date and location.  How do I manage my blood sugars  before surgery?   Check your blood sugar at least 4 times a day, 2 days before surgery to make sure that they are not too high or low.   Check your blood sugar the morning of your surgery when you wake up and every 2               hours until you get to the Short-Stay unit.  If your blood sugar is less than 70 mg/dL, you will need to treat for low blood sugar by:  Treat a low blood sugar (less than 70 mg/dL) with 1/2 cup of clear juice (cranberry or apple), 4 glucose tablets, OR glucose gel.  Recheck blood sugar in 15 minutes after treatment (to make sure it is greater than 70 mg/dL).  If blood sugar is not greater than 70 mg/dL on re-check, call [phone] for further instructions.   Report your blood sugar to the Short-Stay nurse when you get to Short-Stay.  References:  [facility], 2007 "How to Manage your Diabetes Before and After Surgery".  What do I do about my diabetes medications?   Do not take oral diabetes medicines (pills) the morning of surgery.(metformin)     nite before surgery take 80% (28units) of lantus Morning of surgery take 50% of lantus(5units)     Do not wear jewelry, make-up or nail polish.  Do not wear lotions, powders, or perfumes.  You may wear deodorant.  Do not shave 48 hours prior to surgery.   Do not bring valuables to the hospital.  [facility] is not responsible for any belongings  or valuables.  Contacts, dentures or bridgework may not be worn into surgery.  Leave your suitcase in the car.  After surgery it may be brought to your room.  For patients admitted to the hospital, discharge time will be determined by your treatment team.  Patients discharged the day of surgery will not be allowed to drive home.   Please read over the following fact sheets that you were given. Surgical Site Infection, Cough and Deep Breathing  Please follow these instructions carefully:  1. Shower with CHG Soap the night  before surgery and the morning of Surgery. 2. If you choose to wash your hair, wash your hair first as usual with your normal shampoo. 3. After you shampoo, rinse your hair and body thoroughly to remove the Shampoo. 4. Use CHG as you would any other liquid soap. You can apply chg directly to the skin and wash gently with scrungie or a clean washcloth. 5. Apply the CHG Soap to your body ONLY FROM THE NECK DOWN. Do not use on open wounds or open sores. Avoid contact with your eyes, ears, mouth and genitals (private parts). Wash genitals (private parts) with your normal soap. 6. Wash thoroughly, paying special attention to the area where your surgery will be performed. 7. Thoroughly rinse your body with warm water from the neck down. 8. DO NOT shower/wash with your normal soap after using and rinsing off the CHG Soap. 9. Pat yourself dry with a clean towel.  10. Wear clean pajamas.  11. Place clean sheets on your bed the night of your first shower and do not sleep with pets.  Day of Surgery  Do not apply any lotions/deodorants the morning of surgery. Please wear clean clothes to the hospital/surgery center.

## 2015-12-24 LAB — HEMOGLOBIN A1C
Hgb A1c MFr Bld: 8.6 % — ABNORMAL HIGH (ref 4.8–5.6)
Mean Plasma Glucose: 200 mg/dL

## 2015-12-24 NOTE — Progress Notes (Signed)
Anesthesia Chart Review:  Pt is 50 year old female scheduled for R shoulder arthroscopy, debridement, decompression on 12/26/2015 with Dr. Lajoyce Corners.   Cardiologist is Dr. Marca Ancona. PCP is Dr. Lars Masson [facility]). EP cardiologist is Dr. Graciela Husbands.   PMH includes:  Nonischemic cardiomyopathy, AICD (medtronic CRT-D), chronic combined systolic & diastolic HF, HTN, DM, hyperlipidemia, CVA (2015), seizures, liver disease, OSA (inconsistent CPAP use), GERD. Never smoker. BMI 39.   Medications include: amlodipine, ASA, lipitor, digoxin, lasix, hydralazine, lantus, atrovent, imdur, keppra, metformin, metoprolol, protonix, potassium, spironolactone.   Preoperative labs reviewed.  HgbA1c 8.6, glucose 199.   Chest x-ray 06/13/15 reviewed. Mild central pulmonary vascular prominence may reflect low-grade compensated CHF. There is no pulmonary edema or pneumonia.  EKG 09/22/15: NSR with BiV pacing  Echo 10/23/15:  - Left ventricle: The cavity size was normal. There was moderate concentric hypertrophy. Systolic function was severely reduced. The estimated ejection fraction was in the range of 25% to 30%. Diffuse hypokinesis. - Right ventricle: Pacer wire or catheter noted in right ventricle. - Right atrium: The atrium was mildly dilated. - Impressions: EF is reduced when compared to prior echocardiogram.  Renal artery dopplers were normal in 04/2012.  Cardiac cath 06/10/11:  1. Normal coronary arteries. 2. Normal left ventricular function. 3. Severe hypertension.  Pt saw Dr. Gala Romney at HF clinic for pre-op eval 11/25/15. He notes "she is at high-risk for peri-op complications due to her HF and obesity. Risk is not prohibitive but would prefer nerve block with conscious sedation if that is an option."   If no changes, I anticipate pt can proceed with surgery as scheduled.   Rica Mast, FNP-BC [facility] Short Stay Surgical Center/Anesthesiology Phone: [phone] 12/24/2015 9:56  AM

## 2015-12-25 ENCOUNTER — Encounter (HOSPITAL_COMMUNITY): Payer: Self-pay

## 2015-12-25 ENCOUNTER — Other Ambulatory Visit (HOSPITAL_COMMUNITY): Payer: Self-pay | Admitting: *Deleted

## 2015-12-25 ENCOUNTER — Ambulatory Visit (HOSPITAL_COMMUNITY)
Admission: RE | Admit: 2015-12-25 | Discharge: 2015-12-25 | Disposition: A | Discharge status: Home / Self Care | Payer: Medicaid Other | Source: Ambulatory Visit | Attending: Cardiology | Admitting: Cardiology

## 2015-12-25 VITALS — BP 128/84 | HR 88 | Wt 267.5 lb

## 2015-12-25 DIAGNOSIS — I639 Cerebral infarction, unspecified: Secondary | ICD-10-CM | POA: Insufficient documentation

## 2015-12-25 DIAGNOSIS — I5042 Chronic combined systolic (congestive) and diastolic (congestive) heart failure: Secondary | ICD-10-CM | POA: Diagnosis not present

## 2015-12-25 DIAGNOSIS — E049 Nontoxic goiter, unspecified: Secondary | ICD-10-CM | POA: Diagnosis not present

## 2015-12-25 DIAGNOSIS — I5022 Chronic systolic (congestive) heart failure: Secondary | ICD-10-CM | POA: Insufficient documentation

## 2015-12-25 DIAGNOSIS — E1165 Type 2 diabetes mellitus with hyperglycemia: Secondary | ICD-10-CM

## 2015-12-25 DIAGNOSIS — I638 Other cerebral infarction: Secondary | ICD-10-CM | POA: Diagnosis not present

## 2015-12-25 DIAGNOSIS — I5032 Chronic diastolic (congestive) heart failure: Secondary | ICD-10-CM

## 2015-12-25 DIAGNOSIS — I6389 Other cerebral infarction: Secondary | ICD-10-CM

## 2015-12-25 DIAGNOSIS — E1140 Type 2 diabetes mellitus with diabetic neuropathy, unspecified: Secondary | ICD-10-CM | POA: Diagnosis not present

## 2015-12-25 DIAGNOSIS — G4733 Obstructive sleep apnea (adult) (pediatric): Secondary | ICD-10-CM | POA: Diagnosis not present

## 2015-12-25 DIAGNOSIS — IMO0002 Reserved for concepts with insufficient information to code with codable children: Secondary | ICD-10-CM

## 2015-12-25 DIAGNOSIS — I110 Hypertensive heart disease with heart failure: Secondary | ICD-10-CM | POA: Diagnosis not present

## 2015-12-25 DIAGNOSIS — I10 Essential (primary) hypertension: Secondary | ICD-10-CM

## 2015-12-25 LAB — BRAIN NATRIURETIC PEPTIDE: B Natriuretic Peptide: 11.9 pg/mL (ref 0.0–100.0)

## 2015-12-25 LAB — DIGOXIN LEVEL: Digoxin Level: <0.2 ng/mL — ABNORMAL LOW (ref 0.8–2.0)

## 2015-12-25 MED ORDER — CHLORHEXIDINE GLUCONATE 4 % EX LIQD: 60.0000 mL | Freq: Once | CUTANEOUS | Status: DC

## 2015-12-25 MED ORDER — AMLODIPINE BESYLATE 10 MG PO TABS: 10.0000 mg | ORAL_TABLET | Freq: Every day | ORAL | Status: DC

## 2015-12-25 MED ORDER — HYDRALAZINE HCL 100 MG PO TABS: 100.0000 mg | ORAL_TABLET | Freq: Three times a day (TID) | ORAL | Status: DC

## 2015-12-25 MED ORDER — ASPIRIN 81 MG PO TABS: 81.0000 mg | ORAL_TABLET | Freq: Every day | ORAL | Status: DC

## 2015-12-25 MED ORDER — DEXTROSE 5 % IV SOLN
3.0000 g | INTRAVENOUS | Status: AC
  Administered 2015-12-26: 3 g via INTRAVENOUS
  Filled 2015-12-25 (×2): qty 3000

## 2015-12-25 MED ORDER — ISOSORBIDE MONONITRATE ER 60 MG PO TB24: 90.0000 mg | ORAL_TABLET | Freq: Every day | ORAL | Status: DC

## 2015-12-25 NOTE — Progress Notes (Signed)
Patient ID: [name], female   DOB: 01/03/1966, 50 y.o.   MRN: [id number]    Advanced Heart Failure Clinic Note   EP: Dr Graciela Husbands PCP: Dr Ernestine [facility] Primary HF Cardiologist: Dr Shirlee Latch   Angioedema with ACE-I  --Optivol--  HPI: 50 yo with history of morbid obesity, HTN, HLD, CAD, DM2, OSA on CPAP, CVA and combined CHF in the setting of poorly-controlled HTN. She was admitted in 05/2011 with chest pain and new LBBB and ended up getting a left heart cath, which showed no angiographic CAD. Renal artery dopplers were normal in 04/2012.  She has a Medtronic CRT-D device.   Was hospitalized May 2014 with acute on chronic combined systolic and diastolic CHF. EF from December 2013 was down to 40 to 45%.   Admitted in January 2015 with slurred speech, L arm weakness and facial droop. Initial head CT showed no acute abnormality. Repeat head CT showed subtle gray white matter differentiation in R frontal lobe and R operculum perhaps representing ischemic R sided MCA. STAT repeat CT head later on 1/29 showed further evolution of R MCA territory infarct involving R insular cortex and operculum. No hemorrhage. Neurology recommended CTA head/neck, which showed occlusion of MCA branch to the area of R MCA infarct and a normal neck CTA. TEE was done and did not reveal embolic source. Was discharged to inpatient rehab.  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  She was started on Keppra.  She went to the ER in 2/16 with syncope that was probably due to seizure, no arrhythmias with device interrogation.  At last appointment, echo showed EF back down to 25-30%.  Medications were adjusted. She seems to be doing ok.  Weight is down 1 lb.  No chest pain. She is not very active.  BP still running high at times at doctor's appointments. She does not check at home.  BP ok today.  Generally, no dyspnea when walking on flat ground.  She does some walking in  stores.  She is having right shoulder arthroscopy done tomorrow.   ECHO 11/2012: 40-45% ECHO  07/2013: 20% RV normal ECHO    08/23/2013: EF 40-45%, diff HK ECHO   09/05/2013: EF 25-30%, diff HK  ECHO  12/11/13: EF 30%, diff HK, RV normal ECHO 01/14/2014: EF 35-40%, LA mod dilated  ECHO 4/16: EF 40-45%, moderate LVH, normal RV size with mildly decreased systolic function.   ECHO 11/16: EF 25-30%, moderate LVH  Labs 06/2013: proBNP 230, Cr 0.9, K 3.7          08/2013: K+ 3.9, Cr 0.9       09/2013: K 4.2, Cr 0.86, pro-BNP 718, dig level <0.3     10/07/13: K+ 4.1, Cr 0.83       05/08/14: K 4.0 Creatinine 0.67 Pro BNP 233 Dig level 0.3        08/20/2014 K 3.8 nCreatinine 0.56        2/16: K 3.6, creatinine 0.95, LDL 106, HDL 43       3/16: K 3.5, creatinine 0.87, HCT 35.9       7/16: K 3.5, creatinine 0.78. HCT 37       10/16: digoxin 0.2, LDL 116       1/17: K 3.6, creatinine 0.77, HCT 38  ROS: All systems negative except as listed in HPI, PMH and Problem List.  Past Medical History  Diagnosis Date  . Hypertension  Poorly controlled. Has had HTN since age 50. Angioedema with ACEI.  24 Hr urine and renal arterial dopplers ordered . . . Never done  . NICM (nonischemic cardiomyopathy) (HCC)     EF 45-50% in 8/12, cath 6/12 showed normal coronaries, EF 50-55% by LV gram  . Morbid obesity (HCC)   . Allergic rhinitis   . HLD (hyperlipidemia)   . OSA on CPAP     sleep study in 8/12 showed moderate to severe OSA requiring CPAP  . Chronic lower back pain     secondary to DJD, obsetiy, hip problems. Followed by Dr. Ivory Broad (pain management)  . Chronic diastolic heart failure (HCC)      Primarily diastolic CHF: Likely due to uncontrolled HTN. Last echo (8/12) with EF 45-50%, mild to moderate LVH with some asymmetric septal hypertrophy, RV normal size and systolic function. EF 50-55% by LV-gram in 6/12.   . Polyneuropathy in diabetes(357.2)   . Thoracic or lumbosacral neuritis or radiculitis,  unspecified   . Calcifying tendinitis of shoulder   . LBBB (left bundle branch block)   . Chronic combined systolic and diastolic CHF (congestive heart failure) (HCC)     EF 40-45% by echo 12/06/2012  . Coronary artery disease     questionable. LHC 05/2011 showing normal coronaries // Followed at [facility], Dr. Shirlee Latch  . GERD (gastroesophageal reflux disease)   . Automatic implantable cardioverter-defibrillator in situ   . Type II diabetes mellitus (HCC) DX: 2002  . 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 Degeneration of lumbar or lumbosacral intervertebral disc   . Arthritis     "hips, back, legs, arms" (07/04/2014)  . Frequent UTI   . Liver disease   . Presence of permanent cardiac pacemaker   . Stroke [facility]) 12/2013    "my left side is paralyzed" (07/04/2014)  . Shortness of breath     none now  . Asthma     hx  . Seizures (HCC)     last 3 months    Current Outpatient Prescriptions  Medication Sig Dispense Refill  . amLODipine (NORVASC) 10 MG tablet Take 1 tablet (10 mg total) by mouth daily. 90 tablet 1  . aspirin 81 MG tablet Take 1 tablet (81 mg total) by mouth daily. 90 tablet 6  . atorvastatin (LIPITOR) 40 MG tablet Take 1 tablet (40 mg total) by mouth daily. 90 tablet 3  . cyclobenzaprine (FLEXERIL) 10 MG tablet Take 1 tablet (10 mg total) by mouth 2 (two) times daily as needed for muscle spasms. 30 tablet 0  . diclofenac sodium (VOLTAREN) 1 % GEL Apply 2 g topically 4 (four) times daily as needed (pain). 3 Tube 5  . digoxin (LANOXIN) 0.125 MG tablet Take 1 tablet (0.125 mg total) by mouth daily. 30 tablet 5  . docusate sodium (COLACE) 50 MG capsule Take 1 capsule (50 mg total) by mouth 2 (two) times daily as needed for mild constipation. 60 capsule 6  . fluticasone (CUTIVATE) 0.05 % cream Apply topically 2 (two) times daily. Dispense two tubes (Patient taking differently: Apply 1 application topically 2 (two) times daily. ) 30 g 6  . furosemide  (LASIX) 40 MG tablet Take 1 tablet (40 mg total) by mouth 2 (two) times a week. 10 tablet 6  . glucose blood (ACCU-CHEK SMARTVIEW) test strip Use to Check Blood Sugar TID. Code: E11.40. 100 each 12  . hydrALAZINE (APRESOLINE) 100 MG tablet Take 1 tablet (  100 mg total) by mouth 3 (three) times daily. 30 tablet 6  . ibuprofen (ADVIL,MOTRIN) 200 MG tablet Take 200 mg by mouth every 6 (six) hours as needed for moderate pain.    . Insulin Glargine (LANTUS SOLOSTAR) 100 UNIT/ML Solostar Pen Inject 10 units in the AM, 35 units in the PM (Patient taking differently: Inject 10-35 Units into the skin 2 (two) times daily. Inject 10 units in the AM, 35 units in the PM) 45 mL 3  . Insulin Pen Needle (GLOBAL EASE INJECT PEN NEEDLES) 31G X 5 MM MISC Use to inject insulin BID. Code: E 11.40 100 each 10  . ipratropium (ATROVENT) 0.06 % nasal spray Place 2 sprays into both nostrils 4 (four) times daily. 15 mL 1  . isosorbide mononitrate (IMDUR) 60 MG 24 hr tablet Take 1.5 tablets (90 mg total) by mouth daily. 45 tablet 6  . levETIRAcetam (KEPPRA) 500 MG tablet Take 1 tablet (500 mg total) by mouth 2 (two) times daily. 60 tablet 5  . metFORMIN (GLUCOPHAGE) 1000 MG tablet Take 1 tablet (1,000 mg total) by mouth daily with breakfast. 90 tablet 3  . metoprolol succinate (TOPROL-XL) 100 MG 24 hr tablet Take 1 tablet (100 mg total) by mouth 2 (two) times daily. 60 tablet 0  . nystatin (MYCOSTATIN/NYSTOP) 100000 UNIT/GM POWD Apply to affected area twice daily until healed. (Patient taking differently: Apply topically 2 (two) times daily. Apply to affected area twice daily until healed.) 30 g 0  . oxyCODONE-acetaminophen (PERCOCET) 10-325 MG tablet Take 1 tablet by mouth every 8 (eight) hours as needed for pain. 90 tablet 0  . pantoprazole (PROTONIX) 40 MG tablet Take 40 mg by mouth daily.    . polyethylene glycol (MIRALAX) packet Take 17 g by mouth daily. 14 each 1  . Potassium Chloride ER 20 MEQ TBCR Take 20 mEq by mouth  daily. Only on days you take Lasix (Patient taking differently: Take 20 mEq by mouth daily as needed. Only on days you take Lasix) 90 tablet 3  . solifenacin (VESICARE) 10 MG tablet Take 10 mg by mouth daily.    [name] spironolactone (ALDACTONE) 25 MG tablet Take 1 tablet (25 mg total) by mouth daily. 30 tablet 3  . Vitamin D, Ergocalciferol, (DRISDOL) 50000 UNITS CAPS capsule Take 1 capsule (50,000 Units total) by mouth every Monday. 24 capsule 0   No current facility-administered medications for this encounter.   Facility-Administered Medications Ordered in Other Encounters  Medication Dose Route Frequency Provider Last Rate Last Dose  . [START ON 12/26/2015] ceFAZolin (ANCEF) 3 g in dextrose 5 % 50 mL IVPB  3 g Intravenous To SS-Surg [name], MD      . Melene Muller ON 12/26/2015] chlorhexidine (HIBICLENS) 4 % liquid 4 application  60 mL Topical Once [name], MD       Filed Vitals:   12/25/15 1011  BP: 128/84  Pulse: 88  Weight: 267 lb 8 oz (121.337 kg)  SpO2: 99%    Wt Readings from Last 3 Encounters:  12/25/15 267 lb 8 oz (121.337 kg)  12/23/15 264 lb 4.8 oz (119.886 kg)  11/25/15 264 lb 8 oz (119.976 kg)     PHYSICAL EXAM: General:  NAD. No resp difficulty; Neck: supple. Thick. JVP 7 cm. Carotids 2+ bilaterally; no bruits. Thyroid symmetrically enlarged.  Cor: PMI nonpalpable; RRR. No rubs, gallops or murmurs. No S3/S4.  Lungs: clear Abdomen: obese, soft, NT, ND, no HSM. No bruits or masses. +BS  Extremities: no cyanosis, clubbing, rash, Trace ankle edema.  Neuro: alert & orientedx3, cranial nerves grossly intact. Moves all 4 extremities w/o difficulty. Affect pleasant.  ASSESSMENT/PLAN: 1. Chronic systolic HF: Nonischemic cardiomyopathy, normal coronaries on 6/12 angiography.  CMP may be due to long-standing HTN.  Echo 11/16 EF 25-30%.  S/P CRT-D Medtronic.  NYHA class II symptoms, Volume status looks ok.   - Continue Lasix twice weekly. Recent BMET looked ok. - Continue  current spironolactone, digoxin, and Toprol XL.  Check digoxin level with next labs.  - Increase hydralazine to 100 mg tid and Imdur to 90 mg daily.   - No ACE-I due to angioedema.   2. HTN: BP continues to run high at times.  As above, increasing hydralazine/Imdur.  3. Morbid obesity: Exercise limited. Needs to be more active.   4. OSA on CPAP: Try to use CPAP regularly.  5. CVA:  Continue ASA and atorvastatin.  Can decrease ASA to 81 mg daily.  Check lipids/LFTs with next labs, goal LDL < 70.  6. Thyroid enlargement: Check TSH with next labs.  6. Shoulder surgery:  Pt is to have R shoulder surgery with Dr Lajoyce Corners tomorrow.  She would be high risk for general anesthesia, though not prohibitive, with her reduced EF. If nerve block is an option, would be preferred.   Follow up in 2 months.   Marca Ancona  12/25/2015

## 2015-12-25 NOTE — Patient Instructions (Signed)
DECREASE Aspirin to 81 mg once daily.  INCREASE Hydralazine to 100mg  three times daily.  INCREASE Imdur to 90 mg (1.5 tabs) once daily.  Routine lab work today. Will notify you of abnormal results, otherwise no news is good news!  Follow up 2 months.  Do the following things EVERYDAY: 1) Weigh yourself in the morning before breakfast. Write it down and keep it in a log. 2) Take your medicines as prescribed 3) Eat low salt foods-Limit salt (sodium) to 2000 mg per day.  4) Stay as active as you can everyday 5) Limit all fluids for the day to less than 2 liters

## 2015-12-26 ENCOUNTER — Encounter (HOSPITAL_COMMUNITY)
Admission: RE | Disposition: A | Discharge status: Home / Self Care | Payer: Self-pay | Source: Ambulatory Visit | Attending: Orthopedic Surgery

## 2015-12-26 ENCOUNTER — Ambulatory Visit (HOSPITAL_COMMUNITY): Payer: Medicaid Other | Admitting: Emergency Medicine

## 2015-12-26 ENCOUNTER — Ambulatory Visit (HOSPITAL_COMMUNITY): Payer: Medicaid Other | Admitting: Anesthesiology

## 2015-12-26 ENCOUNTER — Encounter (HOSPITAL_COMMUNITY): Payer: Self-pay | Admitting: Certified Registered"

## 2015-12-26 ENCOUNTER — Ambulatory Visit (HOSPITAL_COMMUNITY)
Admission: RE | Admit: 2015-12-26 | Discharge: 2015-12-26 | Disposition: A | Discharge status: Home / Self Care | Payer: Medicaid Other | Source: Ambulatory Visit | Attending: Orthopedic Surgery | Admitting: Orthopedic Surgery

## 2015-12-26 ENCOUNTER — Other Ambulatory Visit: Payer: Self-pay

## 2015-12-26 DIAGNOSIS — Z794 Long term (current) use of insulin: Secondary | ICD-10-CM | POA: Insufficient documentation

## 2015-12-26 DIAGNOSIS — E1140 Type 2 diabetes mellitus with diabetic neuropathy, unspecified: Secondary | ICD-10-CM | POA: Insufficient documentation

## 2015-12-26 DIAGNOSIS — E785 Hyperlipidemia, unspecified: Secondary | ICD-10-CM | POA: Insufficient documentation

## 2015-12-26 DIAGNOSIS — G4733 Obstructive sleep apnea (adult) (pediatric): Secondary | ICD-10-CM | POA: Diagnosis not present

## 2015-12-26 DIAGNOSIS — I5032 Chronic diastolic (congestive) heart failure: Secondary | ICD-10-CM | POA: Diagnosis not present

## 2015-12-26 DIAGNOSIS — I10 Essential (primary) hypertension: Secondary | ICD-10-CM | POA: Insufficient documentation

## 2015-12-26 DIAGNOSIS — M75101 Unspecified rotator cuff tear or rupture of right shoulder, not specified as traumatic: Secondary | ICD-10-CM | POA: Insufficient documentation

## 2015-12-26 DIAGNOSIS — K219 Gastro-esophageal reflux disease without esophagitis: Secondary | ICD-10-CM | POA: Insufficient documentation

## 2015-12-26 DIAGNOSIS — M7501 Adhesive capsulitis of right shoulder: Secondary | ICD-10-CM | POA: Insufficient documentation

## 2015-12-26 DIAGNOSIS — S43421A Sprain of right rotator cuff capsule, initial encounter: Secondary | ICD-10-CM

## 2015-12-26 DIAGNOSIS — I2510 Atherosclerotic heart disease of native coronary artery without angina pectoris: Secondary | ICD-10-CM | POA: Insufficient documentation

## 2015-12-26 HISTORY — PX: SHOULDER ARTHROSCOPY: SHX128

## 2015-12-26 LAB — I-STAT BETA HCG BLOOD, ED (NOT ORDERABLE)

## 2015-12-26 LAB — GLUCOSE, CAPILLARY
GLUCOSE-CAPILLARY: 164 mg/dL — AB (ref 65–99)
Glucose-Capillary: 184 mg/dL — ABNORMAL HIGH (ref 65–99)

## 2015-12-26 SURGERY — ARTHROSCOPY, SHOULDER
Anesthesia: General | Laterality: Right

## 2015-12-26 MED ORDER — BUPIVACAINE HCL (PF) 0.25 % IJ SOLN: INTRAMUSCULAR | Status: DC | PRN

## 2015-12-26 MED ORDER — FENTANYL CITRATE (PF) 100 MCG/2ML IJ SOLN: 25.0000 ug | INTRAMUSCULAR | Status: DC | PRN

## 2015-12-26 MED ORDER — LABETALOL HCL 5 MG/ML IV SOLN
5.0000 mg | Freq: Once | INTRAVENOUS | Status: AC
Start: 2015-12-26 — End: 2015-12-26
  Administered 2015-12-26: 5 mg via INTRAVENOUS
  Filled 2015-12-26: qty 4

## 2015-12-26 MED ORDER — ONDANSETRON HCL 4 MG/2ML IJ SOLN
INTRAMUSCULAR | Status: DC | PRN
  Administered 2015-12-26: 4 mg via INTRAVENOUS

## 2015-12-26 MED ORDER — FENTANYL CITRATE (PF) 250 MCG/5ML IJ SOLN
INTRAMUSCULAR | Status: AC
  Filled 2015-12-26: qty 5

## 2015-12-26 MED ORDER — BUPIVACAINE-EPINEPHRINE (PF) 0.5% -1:200000 IJ SOLN
INTRAMUSCULAR | Status: DC | PRN
  Administered 2015-12-26: 20 mL via PERINEURAL

## 2015-12-26 MED ORDER — ROCURONIUM BROMIDE 100 MG/10ML IV SOLN
INTRAVENOUS | Status: DC | PRN
Start: 2015-12-26 — End: 2015-12-26
  Administered 2015-12-26: 50 mg via INTRAVENOUS

## 2015-12-26 MED ORDER — SODIUM CHLORIDE 0.9 % IR SOLN
Status: DC | PRN
  Administered 2015-12-26: 6000 mL

## 2015-12-26 MED ORDER — ETOMIDATE 2 MG/ML IV SOLN
INTRAVENOUS | Status: AC
  Filled 2015-12-26: qty 10

## 2015-12-26 MED ORDER — GLYCOPYRROLATE 0.2 MG/ML IJ SOLN
INTRAMUSCULAR | Status: DC | PRN
  Administered 2015-12-26: .5 mg via INTRAVENOUS

## 2015-12-26 MED ORDER — ONDANSETRON 4 MG PO TBDP
4.0000 mg | ORAL_TABLET | Freq: Once | ORAL | Status: AC
  Administered 2015-12-26: 4 mg via ORAL
  Filled 2015-12-26: qty 1

## 2015-12-26 MED ORDER — BUPIVACAINE HCL (PF) 0.25 % IJ SOLN
INTRAMUSCULAR | Status: AC
  Filled 2015-12-26: qty 30

## 2015-12-26 MED ORDER — LACTATED RINGERS IV SOLN
INTRAVENOUS | Status: DC
  Administered 2015-12-26: 50 mL/h via INTRAVENOUS

## 2015-12-26 MED ORDER — ROCURONIUM BROMIDE 50 MG/5ML IV SOLN
INTRAVENOUS | Status: AC
  Filled 2015-12-26: qty 1

## 2015-12-26 MED ORDER — MIDAZOLAM HCL 2 MG/2ML IJ SOLN
INTRAMUSCULAR | Status: AC
  Filled 2015-12-26: qty 2

## 2015-12-26 MED ORDER — FENTANYL CITRATE (PF) 100 MCG/2ML IJ SOLN
50.0000 ug | Freq: Once | INTRAMUSCULAR | Status: AC
  Administered 2015-12-26: 50 ug via INTRAVENOUS

## 2015-12-26 MED ORDER — FENTANYL CITRATE (PF) 100 MCG/2ML IJ SOLN
INTRAMUSCULAR | Status: AC
  Administered 2015-12-26: 150 ug via INTRAVENOUS
  Filled 2015-12-26: qty 2

## 2015-12-26 MED ORDER — LABETALOL HCL 5 MG/ML IV SOLN
5.0000 mg | Freq: Once | INTRAVENOUS | Status: AC
Start: 2015-12-26 — End: 2015-12-26
  Administered 2015-12-26: 5 mg via INTRAVENOUS

## 2015-12-26 MED ORDER — NEOSTIGMINE METHYLSULFATE 10 MG/10ML IV SOLN
INTRAVENOUS | Status: DC | PRN
  Administered 2015-12-26: 4 mg via INTRAVENOUS

## 2015-12-26 MED ORDER — MIDAZOLAM HCL 2 MG/2ML IJ SOLN
1.0000 mg | Freq: Once | INTRAMUSCULAR | Status: AC
  Administered 2015-12-26: 1 mg via INTRAVENOUS

## 2015-12-26 MED ORDER — LABETALOL HCL 5 MG/ML IV SOLN
INTRAVENOUS | Status: AC
  Administered 2015-12-26 (×4): 10 mg via INTRAVENOUS
  Filled 2015-12-26: qty 4

## 2015-12-26 MED ORDER — ONDANSETRON HCL 4 MG/2ML IJ SOLN
4.0000 mg | Freq: Once | INTRAMUSCULAR | Status: DC | PRN
  Filled 2015-12-26: qty 2

## 2015-12-26 MED ORDER — LIDOCAINE HCL (CARDIAC) 20 MG/ML IV SOLN
INTRAVENOUS | Status: AC
  Filled 2015-12-26: qty 5

## 2015-12-26 MED ORDER — ETOMIDATE 2 MG/ML IV SOLN
INTRAVENOUS | Status: DC | PRN
  Administered 2015-12-26: 20 mg via INTRAVENOUS

## 2015-12-26 MED ORDER — LIDOCAINE HCL (CARDIAC) 20 MG/ML IV SOLN
INTRAVENOUS | Status: DC | PRN
  Administered 2015-12-26: 100 mg via INTRAVENOUS

## 2015-12-26 MED ORDER — PHENYLEPHRINE HCL 10 MG/ML IJ SOLN
10.0000 mg | INTRAVENOUS | Status: DC | PRN
  Administered 2015-12-26: 5 ug/min via INTRAVENOUS

## 2015-12-26 MED ORDER — SODIUM CHLORIDE 0.9 % IR SOLN
Status: DC | PRN
  Administered 2015-12-26: 1

## 2015-12-26 SURGICAL SUPPLY — 34 items
BLADE GREAT WHITE 4.2 (BLADE) ×2 IMPLANT
BLADE GREAT WHITE 4.2MM (BLADE) ×1
BUR OVAL 6.0 (BURR) ×3 IMPLANT
CANNULA SHOULDER 7CM (CANNULA) ×6 IMPLANT
COVER SURGICAL LIGHT HANDLE (MISCELLANEOUS) ×6 IMPLANT
DRAPE IMP U-DRAPE 54X76 (DRAPES) ×3 IMPLANT
DRAPE INCISE IOBAN 66X45 STRL (DRAPES) ×3 IMPLANT
DRAPE STERI 35X30 U-POUCH (DRAPES) ×3 IMPLANT
DRAPE U-SHAPE 47X51 STRL (DRAPES) ×3 IMPLANT
DRSG EMULSION OIL 3X3 NADH (GAUZE/BANDAGES/DRESSINGS) ×3 IMPLANT
DRSG PAD ABDOMINAL 8X10 ST (GAUZE/BANDAGES/DRESSINGS) ×6 IMPLANT
DURAPREP 26ML APPLICATOR (WOUND CARE) ×3 IMPLANT
GAUZE SPONGE 4X4 12PLY STRL (GAUZE/BANDAGES/DRESSINGS) ×3 IMPLANT
GLOVE BIOGEL PI IND STRL 9 (GLOVE) ×1 IMPLANT
GLOVE BIOGEL PI INDICATOR 9 (GLOVE) ×2
GLOVE SURG ORTHO 9.0 STRL STRW (GLOVE) ×3 IMPLANT
GOWN STRL REUS W/ TWL XL LVL3 (GOWN DISPOSABLE) ×2 IMPLANT
GOWN STRL REUS W/TWL XL LVL3 (GOWN DISPOSABLE) ×4
KIT BASIN OR (CUSTOM PROCEDURE TRAY) ×3 IMPLANT
KIT ROOM TURNOVER OR (KITS) ×3 IMPLANT
MANIFOLD NEPTUNE II (INSTRUMENTS) ×3 IMPLANT
NEEDLE SPNL 18GX3.5 QUINCKE PK (NEEDLE) ×3 IMPLANT
NS IRRIG 1000ML POUR BTL (IV SOLUTION) ×3 IMPLANT
PACK SHOULDER (CUSTOM PROCEDURE TRAY) ×3 IMPLANT
PACK UNIVERSAL I (CUSTOM PROCEDURE TRAY) ×3 IMPLANT
PAD ARMBOARD 7.5X6 YLW CONV (MISCELLANEOUS) ×6 IMPLANT
SET ARTHROSCOPY TUBING (MISCELLANEOUS) ×2
SET ARTHROSCOPY TUBING LN (MISCELLANEOUS) ×1 IMPLANT
SLING ARM IMMOBILIZER XL (CAST SUPPLIES) ×9 IMPLANT
SPONGE LAP 4X18 X RAY DECT (DISPOSABLE) ×3 IMPLANT
SUT ETHILON 2 0 FS 18 (SUTURE) ×3 IMPLANT
TOWEL OR 17X24 6PK STRL BLUE (TOWEL DISPOSABLE) ×6 IMPLANT
WAND HAND CNTRL MULTIVAC 90 (MISCELLANEOUS) ×3 IMPLANT
WATER STERILE IRR 1000ML POUR (IV SOLUTION) ×3 IMPLANT

## 2015-12-26 NOTE — Op Note (Signed)
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 female with a diagnosis of Rotator Cuff Tear Right Shoulder who failed conservative measures and elected for surgical management.    The risks benefits and alternatives were discussed with the patient preoperatively including but not limited to the risks of infection, bleeding, nerve injury, cardiopulmonary complications, the need for revision surgery, among others, and the patient was willing to proceed.  OPERATIVE IMPLANTS: None  OPERATIVE FINDINGS: Significant synovitis involving the entire joint  OPERATIVE PROCEDURE: Patient was brought to the operating room and underwent a general anesthetic after an interscalene block. After adequate levels anesthesia obtained patient's placed the beachchair position and the right upper service was prepped using DuraPrep draped into a sterile field a timeout was called. The scope was inserted through the posterior portal and anterior portal established with outside in technique with an 18-gauge spinal. Visualization showed significant synovitis she had partial tearing of the biceps tendon this was debrided with a shaver and the vapor wand she also had a slap lesion which was also debrided as well as massive rotator cuff inflammation and partial tearing and she underwent extensive debridement of rotator cuff with both the shaver and the vapor wand. There was good attachment the rotator cuff along the humeral head. The scope was inserted removed and placed in the subacromial space and a new lateral portal was established. Visualization also shows significant amount of  bursitis patient underwent bursectomy she had a hooked type III acromion underwent subacromial decompression. The vapor wand was used for hemostasis. The advancements remove the portals closed using 3-0 nylon. A sterile compressive dressing was applied patient was placed in a sling extubated taken to PACU in stable condition plan for discharge to home pain management to her pain management clinic.

## 2015-12-26 NOTE — Progress Notes (Signed)
Magnet was used intra-op for AICD. No need to interrogate post-op per Dr. Desmond Lope

## 2015-12-26 NOTE — Anesthesia Preprocedure Evaluation (Addendum)
Anesthesia Evaluation  Patient identified by MRN, date of birth, ID band Patient awake  General Assessment Comment:50 yo with history of morbid obesity, HTN, HLD, CAD, DM2, OSA on CPAP, CVA and combined CHF in the setting of poorly-controlled HTN.  Reviewed: Allergy & Precautions, NPO status , Patient's Chart, lab work & pertinent test results, reviewed documented beta blocker date and time   History of Anesthesia Complications Negative for: history of anesthetic complications  Airway Mallampati: II  TM Distance: >3 FB Neck ROM: Full    Dental  (+) Teeth Intact, Dental Advisory Given, Edentulous Upper   Pulmonary shortness of breath, sleep apnea (8cm H2O) and Continuous Positive Airway Pressure Ventilation ,    Pulmonary exam normal breath sounds clear to auscultation       Cardiovascular Exercise Tolerance: Poor hypertension, Pt. on medications and Pt. on home beta blockers + CAD and +CHF  + dysrhythmias (LBBB; AV paced) + pacemaker + Cardiac Defibrillator  Rhythm:Regular Rate:Normal  BiV pacing with Medtronic PM/AICD, left chest  EKG 09/22/15: NSR with BiV pacing  Echo 10/23/15:  - Left ventricle: The cavity size was normal. There was moderate concentric hypertrophy. Systolic function was severely reduced. The estimated ejection fraction was in the range of 25% to 30%. Diffuse hypokinesis. - Right ventricle: Pacer wire or catheter noted in right ventricle. - Right atrium: The atrium was mildly dilated. - Impressions: EF is reduced when compared to prior echocardiogram.  Renal artery dopplers were normal in 04/2012.  Cardiac cath 06/10/11:  1. Normal coronary arteries. 2. Normal left ventricular function. 3. Severe hypertension.   Neuro/Psych Seizures - (last seizure 3 months ago), Well Controlled,  PSYCHIATRIC DISORDERS Depression  Neuromuscular disease CVA (left sided paralysis), Residual Symptoms    GI/Hepatic Neg  liver ROS, GERD  Medicated,  Endo/Other  diabetes, Type 2, Oral Hypoglycemic Agents, Insulin DependentMorbid obesityDiabetic neuropathy   Renal/GU negative Renal ROS     Musculoskeletal  (+) Arthritis , Osteoarthritis,    Abdominal   Peds  Hematology negative hematology ROS (+)   Anesthesia Other Findings Day of surgery medications reviewed with the patient.  Reproductive/Obstetrics                        Anesthesia Physical Anesthesia Plan  ASA: IV  Anesthesia Plan: General   Post-op Pain Management: GA combined w/ Regional for post-op pain   Induction: Intravenous  Airway Management Planned: Oral ETT  Additional Equipment:   Intra-op Plan:   Post-operative Plan: Extubation in OR  Informed Consent: I have reviewed the patients History and Physical, chart, labs and discussed the procedure including the risks, benefits and alternatives for the proposed anesthesia with the patient or authorized representative who has indicated his/her understanding and acceptance.   Dental advisory given  Plan Discussed with: CRNA  Anesthesia Plan Comments: (Risks/benefits of general anesthesia discussed with patient including risk of damage to teeth, lips, gum, and tongue, nausea/vomiting, allergic reactions to medications, and the possibility of heart attack, stroke and death.  All patient questions answered.  Patient wishes to proceed.)      Anesthesia Quick Evaluation

## 2015-12-26 NOTE — H&P (Signed)
[name] is an 50 y.o. female.   Chief Complaint: Right shoulder pain and decreased range of motion HPI: Patient is a 50 year old woman with type 2 diabetes who presents with right shoulder pain secondary to adhesive capsulitis rotator cuff tear and impingement  Past Medical History  Diagnosis Date  . Hypertension     Poorly controlled. Has had HTN since age 80. Angioedema with ACEI.  24 Hr urine and renal arterial dopplers ordered . . . Never done  . NICM (nonischemic cardiomyopathy) (HCC)     EF 45-50% in 8/12, cath 6/12 showed normal coronaries, EF 50-55% by LV gram  . Morbid obesity (HCC)   . Allergic rhinitis   . HLD (hyperlipidemia)   . OSA on CPAP     sleep study in 8/12 showed moderate to severe OSA requiring CPAP  . Chronic lower back pain     secondary to DJD, obsetiy, hip problems. Followed by Dr. Ivory Broad (pain management)  . Chronic diastolic heart failure (HCC)      Primarily diastolic CHF: Likely due to uncontrolled HTN. Last echo (8/12) with EF 45-50%, mild to moderate LVH with some asymmetric septal hypertrophy, RV normal size and systolic function. EF 50-55% by LV-gram in 6/12.   . Polyneuropathy in diabetes(357.2)   . Thoracic or lumbosacral neuritis or radiculitis, unspecified   . Calcifying tendinitis of shoulder   . LBBB (left bundle branch block)   . Chronic combined systolic and diastolic CHF (congestive heart failure) (HCC)     EF 40-45% by echo 12/06/2012  . Coronary artery disease     questionable. LHC 05/2011 showing normal coronaries // Followed at [facility], Dr. Shirlee Latch  . GERD (gastroesophageal reflux disease)   . Automatic implantable cardioverter-defibrillator in situ   . Type II diabetes mellitus (HCC) DX: 2002  . 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 Degeneration of lumbar or lumbosacral intervertebral disc   . Arthritis     "hips, back, legs, arms" (07/04/2014)  . Frequent UTI   . Liver disease   .  Presence of permanent cardiac pacemaker   . Stroke [facility]) 12/2013    "my left side is paralyzed" (07/04/2014)  . Shortness of breath     none now  . Asthma     hx  . Seizures (HCC)     last 3 months    Past Surgical History  Procedure Laterality Date  . Tubal ligation  05/31/1985  . Carpal tunnel release Left     denies  . Hemiarthroplasty shoulder fracture Right 1980's    denies  . Multiple tooth extractions  ~ 2011    tumors removed ; "my whole top"  . Cardiac catheterization  05/2011  . Tee without cardioversion N/A 01/14/2014    Procedure: TRANSESOPHAGEAL ECHOCARDIOGRAM (TEE);  Surgeon: Thurmon Fair, MD;  Location[facility] ENDOSCOPY;  Service: Cardiovascular;  Laterality: N/A;  . Breast surgery Bilateral 2011    patient reports benign results  . Bi-ventricular implantable cardioverter defibrillator  (crt-d)  08/2013    [name] 08/23/2013  . Bi-ventricular implantable cardioverter defibrillator N/A 08/22/2013    Procedure: BI-VENTRICULAR IMPLANTABLE CARDIOVERTER DEFIBRILLATOR  (CRT-D);  Surgeon: [name], MD;  Location: [facility] CATH LAB;  Service: Cardiovascular;  Laterality: N/A;    Family History  Problem Relation Age of Onset  . Heart disease Mother 49    Died of MI at age 71 yo  . Heart disease Paternal Grandmother  requiring pacemaker.  [name] Heart disease Paternal Grandfather 88    Died of MI at possibly age 50-53yo  . Stroke Paternal Grandfather   . Heart disease Father 56    MI age 18yo requiring stenting  . Kidney disease Mother     requiring dialysis  . Congestive Heart Failure Mother   . Diabetes Father   . Diabetes Brother   . Heart disease Brother 58    MI at age 74 years old  . Breast cancer Paternal Aunt   . Breast cancer Maternal Grandmother   . Glaucoma Father    Social History:  reports that she has never smoked. She has never used smokeless tobacco. She reports that she does not drink alcohol or use illicit drugs.  Allergies:  Allergies  Allergen  Reactions  . Ace Inhibitors Anaphylaxis    Swelling of the tongue and throat   . Clindamycin Anaphylaxis and Swelling  . Enalapril Maleate Anaphylaxis    Swelling of the tongue and throat   . Lisinopril Anaphylaxis    Swelling of the tongue and throat     No prescriptions prior to admission    Results for orders placed or performed during the hospital encounter of 12/25/15 (from the past 48 hour(s))  B Nat Peptide     Status: None   Collection Time: 12/25/15 11:02 AM  Result Value Ref Range   B Natriuretic Peptide 11.9 0.0 - 100.0 pg/mL  Digoxin level     Status: Abnormal   Collection Time: 12/25/15 11:03 AM  Result Value Ref Range   Digoxin Level <0.2 (L) 0.8 - 2.0 ng/mL    Comment: RESULTS CONFIRMED BY MANUAL DILUTION   No results found.  Review of Systems  All other systems reviewed and are negative.   There were no vitals taken for this visit. Physical Exam  On examination patient has decreased range of motion of her shoulder she has pain with Neer and Hawkins impingement test pain with drop arm test. Assessment/Plan Assessment: Adhesive capsulitis with rotator cuff tear and impingement right shoulder.  Plan: We'll plan for right shoulder arthroscopy debridement of adhesions debridement of rotator cuff tear and SLAP lesion as well as subacromial decompression. Risks and benefits were discussed including persistent pain. Patient states she understands wish to proceed at this time.  Brette Cast V 12/26/2015, 6:14 AM

## 2015-12-26 NOTE — Progress Notes (Signed)
Patient's blood pressure 213/90.  Dr. Desmond Lope made aware.

## 2015-12-26 NOTE — Anesthesia Postprocedure Evaluation (Signed)
Anesthesia Post Note  Patient: ASHRITA CHRISMER  Procedure(s) Performed: Procedure(s) (LRB): Right Shoulder Arthroscopy, Debridement, and Decompression (Right)  Patient location during evaluation: PACU Anesthesia Type: General and Regional Level of consciousness: awake and alert, awake, patient cooperative and oriented Pain management: pain level controlled Vital Signs Assessment: post-procedure vital signs reviewed and stable Respiratory status: spontaneous breathing, nonlabored ventilation, respiratory function stable and patient connected to nasal cannula oxygen Cardiovascular status: blood pressure returned to baseline and stable Postop Assessment: no signs of nausea or vomiting Anesthetic complications: no Comments: Right shoulder with minimal pain after ISB.  Pacer/AICD does not need to be checked in PACU per device rep.    Last Vitals:  Filed Vitals:   12/26/15 1530 12/26/15 1545  BP: 141/94   Pulse: 87 87  Temp:    Resp: 16 13    Last Pain:  Filed Vitals:   12/26/15 1603  PainSc: 5                  Cecile Hearing

## 2015-12-26 NOTE — Transfer of Care (Signed)
Immediate Anesthesia Transfer of Care Note  Patient: [name]  Procedure(s) Performed: Procedure(s): Right Shoulder Arthroscopy, Debridement, and Decompression (Right)  Patient Location: PACU  Anesthesia Type:GA combined with regional for post-op pain  Level of Consciousness: awake, alert , oriented and patient cooperative  Airway & Oxygen Therapy: Patient Spontanous Breathing and Patient connected to face mask oxygen  Post-op Assessment: Report given to RN and Post -op Vital signs reviewed and stable  Post vital signs: Reviewed and stable  Last Vitals:  Filed Vitals:   12/26/15 1155 12/26/15 1200  BP: 200/105 191/102  Pulse: 89 88  Temp:    Resp: 18 13    Complications: No apparent anesthesia complications

## 2015-12-26 NOTE — Anesthesia Procedure Notes (Addendum)
Anesthesia Regional Block:  Interscalene brachial plexus block  Pre-Anesthetic Checklist: ,, timeout performed, Correct Patient, Correct Site, Correct Laterality, Correct Procedure, Correct Position, site marked, Risks and benefits discussed,  Surgical consent,  Pre-op evaluation,  At surgeon's request and post-op pain management  Laterality: Right  Prep: chloraprep       Needles:  Injection technique: Single-shot  Needle Type: Echogenic Stimulator Needle     Needle Length: 5cm 5 cm Needle Gauge: 22 and 22 G    Additional Needles:  Procedures: ultrasound guided (picture in chart) Interscalene brachial plexus block Narrative:  Start time: 12/26/2015 11:05 AM End time: 12/26/2015 11:08 AM Injection made incrementally with aspirations every 5 mL.  Performed by: Personally  Anesthesiologist: Cecile Hearing  Additional Notes: Functioning IV was confirmed and monitors were applied.  A 27mm 22ga Arrow echogenic stimulator needle was used. Sterile prep, hand hygiene and sterile gloves were used.  Negative aspiration and negative test dose prior to incremental administration of local anesthetic. The patient tolerated the procedure well.  Ultrasound guidance: relevent anatomy identified, needle position confirmed, local anesthetic spread visualized around nerve(s), vascular puncture avoided.  Image printed for medical record.    Procedure Name: Intubation Date/Time: 12/26/2015 12:20 PM Performed by: Wilder Glade Pre-anesthesia Checklist: Patient identified, Emergency Drugs available, Suction available, Patient being monitored and Timeout performed Patient Re-evaluated:Patient Re-evaluated prior to inductionOxygen Delivery Method: Circle system utilized Preoxygenation: Pre-oxygenation with 100% oxygen Intubation Type: IV induction Ventilation: Mask ventilation without difficulty Laryngoscope Size: Miller and 2 Grade View: Grade I Tube type: Oral Tube size: 7.5 mm Number of  attempts: 1 Placement Confirmation: ETT inserted through vocal cords under direct vision,  positive ETCO2 and breath sounds checked- equal and bilateral Secured at: 23 cm Tube secured with: Tape Dental Injury: Teeth and Oropharynx as per pre-operative assessment

## 2015-12-28 NOTE — Progress Notes (Signed)
Pt stated dressing causing itching and tape caused blister on skin. "my daughter changed dressing yesterday". Stated she had sterile dressing materials. Advised pt to call Dr. Audrie Lia office Monday and let them know she had removed dressing. Patient stated understanding

## 2015-12-29 ENCOUNTER — Encounter (HOSPITAL_COMMUNITY): Payer: Self-pay | Admitting: Orthopedic Surgery

## 2015-12-29 ENCOUNTER — Telehealth (HOSPITAL_COMMUNITY): Payer: Self-pay | Admitting: Cardiology

## 2015-12-29 DIAGNOSIS — E1165 Type 2 diabetes mellitus with hyperglycemia: Principal | ICD-10-CM

## 2015-12-29 DIAGNOSIS — IMO0002 Reserved for concepts with insufficient information to code with codable children: Secondary | ICD-10-CM

## 2015-12-29 DIAGNOSIS — E1140 Type 2 diabetes mellitus with diabetic neuropathy, unspecified: Secondary | ICD-10-CM

## 2015-12-29 DIAGNOSIS — I5022 Chronic systolic (congestive) heart failure: Secondary | ICD-10-CM

## 2015-12-29 MED ORDER — ASPIRIN 81 MG PO TABS: 81.0000 mg | ORAL_TABLET | Freq: Every day | ORAL | Status: DC

## 2015-12-29 MED ORDER — HYDRALAZINE HCL 100 MG PO TABS: 100.0000 mg | ORAL_TABLET | Freq: Three times a day (TID) | ORAL | Status: DC

## 2015-12-29 NOTE — Telephone Encounter (Signed)
Patients pharmacy called for clarifications regarding meds, New rx sent

## 2016-01-01 ENCOUNTER — Other Ambulatory Visit: Payer: Self-pay | Admitting: *Deleted

## 2016-01-07 ENCOUNTER — Encounter: Payer: Self-pay | Admitting: Physical Medicine & Rehabilitation

## 2016-01-07 ENCOUNTER — Encounter: Payer: Medicaid Other | Attending: Physical Medicine & Rehabilitation | Admitting: Physical Medicine & Rehabilitation

## 2016-01-07 VITALS — BP 161/101 | HR 68 | Resp 14

## 2016-01-07 DIAGNOSIS — Z79899 Other long term (current) drug therapy: Secondary | ICD-10-CM | POA: Diagnosis present

## 2016-01-07 DIAGNOSIS — E1142 Type 2 diabetes mellitus with diabetic polyneuropathy: Secondary | ICD-10-CM

## 2016-01-07 DIAGNOSIS — I63319 Cerebral infarction due to thrombosis of unspecified middle cerebral artery: Secondary | ICD-10-CM | POA: Diagnosis present

## 2016-01-07 DIAGNOSIS — I5042 Chronic combined systolic (congestive) and diastolic (congestive) heart failure: Secondary | ICD-10-CM | POA: Diagnosis present

## 2016-01-07 DIAGNOSIS — E1342 Other specified diabetes mellitus with diabetic polyneuropathy: Secondary | ICD-10-CM | POA: Insufficient documentation

## 2016-01-07 DIAGNOSIS — Z5181 Encounter for therapeutic drug level monitoring: Secondary | ICD-10-CM | POA: Diagnosis present

## 2016-01-07 DIAGNOSIS — G8114 Spastic hemiplegia affecting left nondominant side: Secondary | ICD-10-CM

## 2016-01-07 DIAGNOSIS — I69354 Hemiplegia and hemiparesis following cerebral infarction affecting left non-dominant side: Secondary | ICD-10-CM | POA: Diagnosis not present

## 2016-01-07 DIAGNOSIS — G629 Polyneuropathy, unspecified: Secondary | ICD-10-CM | POA: Insufficient documentation

## 2016-01-07 DIAGNOSIS — G8110 Spastic hemiplegia affecting unspecified side: Secondary | ICD-10-CM | POA: Diagnosis not present

## 2016-01-07 DIAGNOSIS — M47817 Spondylosis without myelopathy or radiculopathy, lumbosacral region: Secondary | ICD-10-CM | POA: Insufficient documentation

## 2016-01-07 DIAGNOSIS — I63311 Cerebral infarction due to thrombosis of right middle cerebral artery: Secondary | ICD-10-CM

## 2016-01-07 MED ORDER — OXYCODONE-ACETAMINOPHEN 10-325 MG PO TABS: 1.0000 | ORAL_TABLET | Freq: Three times a day (TID) | ORAL | Status: DC | PRN

## 2016-01-07 NOTE — Progress Notes (Signed)
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 This includes bleeding, bruising, infection, excessive weakness, or medication side effects. A REMS form is on file and signed.  Needle: 40mm injectable monopolar needle electrode  Number of units per muscle Pectoralis Major 0 units Pectoralis Minor 0 units Biceps 75 units Brachioradialis 50 units FCR 25 units FCU 25 units FDS 75 units FDP 75 units FPL 50 units Pronator Teres 25 units Pronator Quadratus 0 units Quadriceps 0 units Gastroc/soleus 0 units Hamstrings 0 units Tibialis Posterior 0 units EHL 0 units All injections were done after obtaining appropriate EMG activity and after negative drawback for blood. The patient tolerated the procedure well. Post procedure instructions were given. A followup appointment was made.

## 2016-01-12 ENCOUNTER — Other Ambulatory Visit: Payer: Self-pay | Admitting: *Deleted

## 2016-01-12 DIAGNOSIS — IMO0002 Reserved for concepts with insufficient information to code with codable children: Secondary | ICD-10-CM

## 2016-01-12 DIAGNOSIS — E1165 Type 2 diabetes mellitus with hyperglycemia: Principal | ICD-10-CM

## 2016-01-12 DIAGNOSIS — E1140 Type 2 diabetes mellitus with diabetic neuropathy, unspecified: Secondary | ICD-10-CM

## 2016-01-12 MED ORDER — CYCLOBENZAPRINE HCL 10 MG PO TABS: 10.0000 mg | ORAL_TABLET | Freq: Two times a day (BID) | ORAL | Status: DC | PRN

## 2016-01-12 MED ORDER — LEVETIRACETAM 500 MG PO TABS: 500.0000 mg | ORAL_TABLET | Freq: Two times a day (BID) | ORAL | Status: DC

## 2016-01-13 ENCOUNTER — Other Ambulatory Visit (HOSPITAL_COMMUNITY): Payer: Self-pay | Admitting: *Deleted

## 2016-01-13 MED ORDER — FUROSEMIDE 40 MG PO TABS: 40.0000 mg | ORAL_TABLET | ORAL | Status: DC

## 2016-01-19 ENCOUNTER — Other Ambulatory Visit: Payer: Self-pay | Admitting: Internal Medicine

## 2016-01-19 DIAGNOSIS — N3946 Mixed incontinence: Secondary | ICD-10-CM

## 2016-01-19 DIAGNOSIS — B372 Candidiasis of skin and nail: Secondary | ICD-10-CM

## 2016-01-19 NOTE — Telephone Encounter (Signed)
Pt requesting nystatin to be filled. °

## 2016-01-20 MED ORDER — NYSTATIN 100000 UNIT/GM EX POWD: CUTANEOUS | Status: DC

## 2016-01-27 ENCOUNTER — Encounter: Payer: Medicaid Other | Admitting: Internal Medicine

## 2016-02-02 ENCOUNTER — Telehealth: Payer: Self-pay | Admitting: Dietician

## 2016-02-02 NOTE — Telephone Encounter (Addendum)
Called patient's pharmacies and patient as part of project trying to help patient's with a1C between 8-9% lower their blood sugars to target [facility] reports having filled 2 medicines:  July 2016 digoxin .125mg , and    Percocet 10-325 dispensed 90, filled in Dec 2016 and  January 2017. [facility] refill history is in your box. Patient agreed to visit with Diabetes educator to work on getting her A1C down and and annual review. Her last visit for diabetes self management training was 2014. Please enter a referral and have patient schedule an appointment at front desk.

## 2016-02-03 ENCOUNTER — Ambulatory Visit (INDEPENDENT_AMBULATORY_CARE_PROVIDER_SITE_OTHER): Payer: Medicaid Other | Admitting: Internal Medicine

## 2016-02-03 ENCOUNTER — Encounter: Payer: Self-pay | Admitting: Internal Medicine

## 2016-02-03 VITALS — BP 153/89 | HR 85 | Temp 97.6°F | Ht 69.0 in | Wt 270.9 lb

## 2016-02-03 DIAGNOSIS — R21 Rash and other nonspecific skin eruption: Secondary | ICD-10-CM | POA: Diagnosis not present

## 2016-02-03 DIAGNOSIS — Z794 Long term (current) use of insulin: Secondary | ICD-10-CM

## 2016-02-03 DIAGNOSIS — B372 Candidiasis of skin and nail: Secondary | ICD-10-CM

## 2016-02-03 DIAGNOSIS — E1165 Type 2 diabetes mellitus with hyperglycemia: Secondary | ICD-10-CM | POA: Diagnosis not present

## 2016-02-03 DIAGNOSIS — Z7984 Long term (current) use of oral hypoglycemic drugs: Secondary | ICD-10-CM

## 2016-02-03 DIAGNOSIS — E1140 Type 2 diabetes mellitus with diabetic neuropathy, unspecified: Secondary | ICD-10-CM | POA: Diagnosis not present

## 2016-02-03 DIAGNOSIS — L259 Unspecified contact dermatitis, unspecified cause: Secondary | ICD-10-CM | POA: Insufficient documentation

## 2016-02-03 DIAGNOSIS — IMO0002 Reserved for concepts with insufficient information to code with codable children: Secondary | ICD-10-CM

## 2016-02-03 LAB — GLUCOSE, CAPILLARY: GLUCOSE-CAPILLARY: 128 mg/dL — AB (ref 65–99)

## 2016-02-03 MED ORDER — METFORMIN HCL 1000 MG PO TABS: 1000.0000 mg | ORAL_TABLET | Freq: Two times a day (BID) | ORAL | Status: DC

## 2016-02-03 MED ORDER — TRIAMCINOLONE ACETONIDE 0.025 % EX OINT: 1.0000 "application " | TOPICAL_OINTMENT | Freq: Two times a day (BID) | CUTANEOUS | Status: DC

## 2016-02-03 MED ORDER — NYSTATIN 100000 UNIT/GM EX CREA: 1.0000 "application " | TOPICAL_CREAM | Freq: Two times a day (BID) | CUTANEOUS | Status: DC

## 2016-02-03 MED ORDER — INSULIN GLARGINE 100 UNIT/ML SOLOSTAR PEN: 45.0000 [IU] | PEN_INJECTOR | Freq: Every day | SUBCUTANEOUS | Status: DC

## 2016-02-03 NOTE — Patient Instructions (Signed)
1. Please make a follow up appointment for 4 weeks.   2. Please take all medications as previously prescribed with the following changes:  Start taking Lantus 45 units once at night. No longer twice daily.   Increase your Metformin to 1000 mg twice daily.   Use Nystatin cream under abdominal skin and on back side. Use Kenalog on your left arm. CHANGE YOUR DETERGENT TO ONE THAT DOESN'T MAKE YOU ITCH.   3. If you have worsening of your symptoms or new symptoms arise, please call the clinic ([phone]), or go to the ER immediately if symptoms are severe.

## 2016-02-03 NOTE — Progress Notes (Signed)
Subjective:   Patient ID: [name] female   DOB: 1966/10/12 50 y.o.   MRN: [id number]  HPI: [name] is a 50 y.o. female w/ PMHx of HTN, HLD, uncontrolled DM type II, OSA, CAD, CHF, and h/o CVA w/ left-sided residual weakness, and seizures, presents to the clinic today for a follow up visit regarding her DM and other chronic medical issues. Her HbA1c was checked at CHF clinic and was shown to be 8.6%, increased from her previous HbA1c of 7.0 in 08/2015. She feels her sugars have not been as good lately, thinks it might be related to her recent shoulder surgery and stress at home. She otherwise feels okay lately. Has a rash underneath her abdominal pannus and on her buttocks that are causing her some discomfort.   Current Outpatient Prescriptions  Medication Sig Dispense Refill  . amLODipine (NORVASC) 10 MG tablet Take 1 tablet (10 mg total) by mouth daily. 90 tablet 1  . aspirin 81 MG tablet Take 1 tablet (81 mg total) by mouth daily. 90 tablet 6  . atorvastatin (LIPITOR) 40 MG tablet Take 1 tablet (40 mg total) by mouth daily. 90 tablet 3  . cyclobenzaprine (FLEXERIL) 10 MG tablet Take 1 tablet (10 mg total) by mouth 2 (two) times daily as needed for muscle spasms. 60 tablet 1  . diclofenac sodium (VOLTAREN) 1 % GEL Apply 2 g topically 4 (four) times daily as needed (pain). 3 Tube 5  . digoxin (LANOXIN) 0.125 MG tablet Take 1 tablet (0.125 mg total) by mouth daily. 30 tablet 5  . docusate sodium (COLACE) 50 MG capsule Take 1 capsule (50 mg total) by mouth 2 (two) times daily as needed for mild constipation. 60 capsule 6  . fluticasone (CUTIVATE) 0.05 % cream Apply topically 2 (two) times daily. Dispense two tubes (Patient taking differently: Apply 1 application topically 2 (two) times daily. ) 30 g 6  . furosemide (LASIX) 40 MG tablet Take 1 tablet (40 mg total) by mouth 2 (two) times a week. 10 tablet 6  . glucose blood (ACCU-CHEK SMARTVIEW) test strip Use to Check Blood  Sugar TID. Code: E11.40. 100 each 12  . hydrALAZINE (APRESOLINE) 100 MG tablet Take 1 tablet (100 mg total) by mouth 3 (three) times daily. 90 tablet 6  . ibuprofen (ADVIL,MOTRIN) 200 MG tablet Take 200 mg by mouth every 6 (six) hours as needed for moderate pain.    . Insulin Glargine (LANTUS SOLOSTAR) 100 UNIT/ML Solostar Pen Inject 10 units in the AM, 35 units in the PM (Patient taking differently: Inject 10-35 Units into the skin 2 (two) times daily. Inject 10 units in the AM, 35 units in the PM) 45 mL 3  . Insulin Pen Needle (GLOBAL EASE INJECT PEN NEEDLES) 31G X 5 MM MISC Use to inject insulin BID. Code: E 11.40 100 each 10  . ipratropium (ATROVENT) 0.06 % nasal spray Place 2 sprays into both nostrils 4 (four) times daily. 15 mL 1  . isosorbide mononitrate (IMDUR) 60 MG 24 hr tablet Take 1.5 tablets (90 mg total) by mouth daily. 45 tablet 6  . levETIRAcetam (KEPPRA) 500 MG tablet Take 1 tablet (500 mg total) by mouth 2 (two) times daily. 60 tablet 5  . metFORMIN (GLUCOPHAGE) 1000 MG tablet Take 1 tablet (1,000 mg total) by mouth daily with breakfast. 90 tablet 3  . metoprolol succinate (TOPROL-XL) 100 MG 24 hr tablet Take 1 tablet (100 mg total) by mouth 2 (two) times  daily. 60 tablet 0  . nystatin (MYCOSTATIN/NYSTOP) 100000 UNIT/GM POWD Apply to affected area twice daily until healed. 30 g 2  . oxyCODONE-acetaminophen (PERCOCET) 10-325 MG tablet Take 1 tablet by mouth every 8 (eight) hours as needed for pain. 90 tablet 0  . pantoprazole (PROTONIX) 40 MG tablet Take 40 mg by mouth daily.    . polyethylene glycol (MIRALAX) packet Take 17 g by mouth daily. 14 each 1  . Potassium Chloride ER 20 MEQ TBCR Take 20 mEq by mouth daily. Only on days you take Lasix (Patient taking differently: Take 20 mEq by mouth daily as needed. Only on days you take Lasix) 90 tablet 3  . solifenacin (VESICARE) 10 MG tablet Take 10 mg by mouth daily.    [name] spironolactone (ALDACTONE) 25 MG tablet Take 1 tablet (25 mg  total) by mouth daily. 30 tablet 3  . Vitamin D, Ergocalciferol, (DRISDOL) 50000 UNITS CAPS capsule Take 1 capsule (50,000 Units total) by mouth every Monday. 24 capsule 0   No current facility-administered medications for this visit.   Review of Systems  General: Denies fever, diaphoresis, appetite change, and fatigue.  Respiratory: Denies SOB, cough, and wheezing.   Cardiovascular: Denies chest pain and palpitations.  Gastrointestinal: Denies nausea, vomiting, abdominal pain, and diarrhea Musculoskeletal: Denies myalgias, arthralgias, back pain, and gait problem.  Neurological: Denies dizziness, syncope, weakness, lightheadedness, and headaches.  Psychiatric/Behavioral: Denies mood changes, sleep disturbance, and agitation.    Objective:   Physical Exam: Filed Vitals:   02/03/16 1608 02/03/16 1625  BP: 163/86 153/89  Pulse: 88 85  Temp: 97.6 F (36.4 C)   TempSrc: Oral   Height: 5\' 9"  (1.753 m)   Weight: 270 lb 14.4 oz (122.879 kg)   SpO2: 100%      General: Alert, cooperative, NAD. HEENT: PERRL, EOMI. Moist mucus membranes. Left-sided facial droop.  Neck: Full range of motion without pain, supple, no lymphadenopathy or carotid bruits Lungs: Clear to ascultation bilaterally, normal work of respiration, no wheezes, rales, rhonchi Heart: RRR, no murmurs, gallops, or rubs Abdomen: Soft, non-tender, non-distended, BS + Extremities: No cyanosis, clubbing, or edema. Left upper and lower extremity weakness.  Skin: Scaly, erythematous rash under abdomina pannus and on left arm. Arm with excoriations.  Neurologic: Alert & oriented X3, cranial nerves II-XII intact, 1/5 strength in LUE, 3/5 in LLE. Otherwise strength intact.   Assessment & Plan:   Please see problem based assessment and plan.

## 2016-02-04 ENCOUNTER — Telehealth: Payer: Self-pay

## 2016-02-04 DIAGNOSIS — E1140 Type 2 diabetes mellitus with diabetic neuropathy, unspecified: Secondary | ICD-10-CM | POA: Insufficient documentation

## 2016-02-04 DIAGNOSIS — I63311 Cerebral infarction due to thrombosis of right middle cerebral artery: Secondary | ICD-10-CM

## 2016-02-04 DIAGNOSIS — E1142 Type 2 diabetes mellitus with diabetic polyneuropathy: Secondary | ICD-10-CM

## 2016-02-04 DIAGNOSIS — G8114 Spastic hemiplegia affecting left nondominant side: Secondary | ICD-10-CM

## 2016-02-04 DIAGNOSIS — E1165 Type 2 diabetes mellitus with hyperglycemia: Principal | ICD-10-CM

## 2016-02-04 MED ORDER — OXYCODONE-ACETAMINOPHEN 10-325 MG PO TABS: 1.0000 | ORAL_TABLET | Freq: Three times a day (TID) | ORAL | Status: DC | PRN

## 2016-02-04 NOTE — Telephone Encounter (Signed)
rx written

## 2016-02-04 NOTE — Telephone Encounter (Signed)
Pt called requesting a refill for her Oxycodone. Please advise.

## 2016-02-04 NOTE — Telephone Encounter (Signed)
Rx signed and place up front. Pt is aware.

## 2016-02-04 NOTE — Assessment & Plan Note (Signed)
Patient recently changed her detergent, now has itchy rash on her left arm, appears to be improving, but several excoriations, patient says it is quite itchy. Appears to be a contact dermatitis related to her new detergent.  -Advised to go back to other detergent -Kenalog cream bid until improvement/resolution.

## 2016-02-04 NOTE — Assessment & Plan Note (Signed)
Definitely has intertrigo, says she has the powder but it goes everywhere.  -Given Rx for Nystatin cream -If this continues to be an issue, could consider Interdry

## 2016-02-04 NOTE — Assessment & Plan Note (Signed)
Lab Results  Component Value Date   HGBA1C 8.6* 12/23/2015   HGBA1C 7.0 09/09/2015   HGBA1C 7.2 04/22/2015     Assessment: Diabetes control:  Deteriorated Comments: Patient has not changed her DM regimen, states that she has not missed doses. Suspect herr recent change in her DM is related to recent shoulder surgery and stressors at home. Says her sugars continue to be slightly higher than they were in previous months.   Plan: Medications:  Increase Metformin to 1000 mg bid. No issues with tolerating this medication previously. Change Lantus to 45 units qhs rather than bid dosing. Feel this will simplify her regimen, can add Januvia or different medication later if necessary.  Home glucose monitoring: Frequency:  bid Instruction/counseling given: reminded to bring blood glucose meter & log to each visit, reminded to bring medications to each visit, discussed foot care, discussed the need for weight loss and discussed diet Other plans: RTC in 4 weeks for follow up. Will also meet with [facility].

## 2016-02-05 NOTE — Progress Notes (Signed)
Internal Medicine Clinic Attending  Case discussed with Dr. Jones at the time of the visit.  We reviewed the resident's history and exam and pertinent patient test results.  I agree with the assessment, diagnosis, and plan of care documented in the resident's note.  

## 2016-02-20 ENCOUNTER — Other Ambulatory Visit: Payer: Self-pay | Admitting: *Deleted

## 2016-02-20 DIAGNOSIS — L259 Unspecified contact dermatitis, unspecified cause: Secondary | ICD-10-CM

## 2016-02-20 DIAGNOSIS — B372 Candidiasis of skin and nail: Secondary | ICD-10-CM

## 2016-02-20 MED ORDER — NYSTATIN 100000 UNIT/GM EX CREA: 1.0000 "application " | TOPICAL_CREAM | Freq: Two times a day (BID) | CUTANEOUS | Status: DC

## 2016-02-20 MED ORDER — TRIAMCINOLONE ACETONIDE 0.025 % EX OINT: 1.0000 "application " | TOPICAL_OINTMENT | Freq: Two times a day (BID) | CUTANEOUS | Status: DC

## 2016-02-20 NOTE — Telephone Encounter (Signed)
Last appt 02/03/16

## 2016-03-01 ENCOUNTER — Encounter: Payer: Medicaid Other | Admitting: Internal Medicine

## 2016-03-01 ENCOUNTER — Encounter: Payer: Medicaid Other | Admitting: Dietician

## 2016-03-01 NOTE — Addendum Note (Signed)
Addended by: Neomia Dear on: 03/01/2016 03:16 PM   Modules accepted: Orders

## 2016-03-03 ENCOUNTER — Encounter: Payer: Medicaid Other | Attending: Physical Medicine & Rehabilitation | Admitting: Physical Medicine & Rehabilitation

## 2016-03-03 ENCOUNTER — Encounter: Payer: Self-pay | Admitting: Physical Medicine & Rehabilitation

## 2016-03-03 VITALS — BP 192/114 | HR 100 | Resp 16

## 2016-03-03 DIAGNOSIS — M47817 Spondylosis without myelopathy or radiculopathy, lumbosacral region: Secondary | ICD-10-CM | POA: Insufficient documentation

## 2016-03-03 DIAGNOSIS — I63319 Cerebral infarction due to thrombosis of unspecified middle cerebral artery: Secondary | ICD-10-CM | POA: Diagnosis present

## 2016-03-03 DIAGNOSIS — E1142 Type 2 diabetes mellitus with diabetic polyneuropathy: Secondary | ICD-10-CM | POA: Diagnosis not present

## 2016-03-03 DIAGNOSIS — Z5181 Encounter for therapeutic drug level monitoring: Secondary | ICD-10-CM | POA: Diagnosis not present

## 2016-03-03 DIAGNOSIS — E1342 Other specified diabetes mellitus with diabetic polyneuropathy: Secondary | ICD-10-CM | POA: Insufficient documentation

## 2016-03-03 DIAGNOSIS — M1712 Unilateral primary osteoarthritis, left knee: Secondary | ICD-10-CM

## 2016-03-03 DIAGNOSIS — I5042 Chronic combined systolic (congestive) and diastolic (congestive) heart failure: Secondary | ICD-10-CM | POA: Diagnosis present

## 2016-03-03 DIAGNOSIS — G8110 Spastic hemiplegia affecting unspecified side: Secondary | ICD-10-CM | POA: Diagnosis not present

## 2016-03-03 DIAGNOSIS — M47896 Other spondylosis, lumbar region: Secondary | ICD-10-CM

## 2016-03-03 DIAGNOSIS — G8114 Spastic hemiplegia affecting left nondominant side: Secondary | ICD-10-CM | POA: Diagnosis not present

## 2016-03-03 DIAGNOSIS — G629 Polyneuropathy, unspecified: Secondary | ICD-10-CM | POA: Insufficient documentation

## 2016-03-03 DIAGNOSIS — Z79899 Other long term (current) drug therapy: Secondary | ICD-10-CM | POA: Diagnosis not present

## 2016-03-03 DIAGNOSIS — G894 Chronic pain syndrome: Secondary | ICD-10-CM | POA: Diagnosis not present

## 2016-03-03 DIAGNOSIS — I63311 Cerebral infarction due to thrombosis of right middle cerebral artery: Secondary | ICD-10-CM

## 2016-03-03 MED ORDER — OXYCODONE-ACETAMINOPHEN 10-325 MG PO TABS: 1.0000 | ORAL_TABLET | Freq: Three times a day (TID) | ORAL | Status: DC | PRN

## 2016-03-03 MED ORDER — DICLOFENAC SODIUM 1 % TD GEL: 2.0000 g | Freq: Four times a day (QID) | TRANSDERMAL | Status: DC | PRN

## 2016-03-03 NOTE — Progress Notes (Signed)
Subjective:    Patient ID: [name], female    DOB: June 23, 1966, 50 y.o.   MRN: [id number]  HPI   [name] is here in follow up of her CVA and spastic left hemiparesis. The botox helps her tone. She feels that her arm and hand are much more flexible. It tends to be tightest in the AM.   She continues on percocet per prescription from our office.  Both of her knees are giving her "problems". She has pain when she transfers or walks. It's hard to get out of the bed. She tries to massage them and uses ice twice per day. The left knee bothers her more than the right. Xrays from June 2016 showed mild to moderate tricompartmental OA. She is using voltaren gel.      Pain Inventory Average Pain 0 Pain Right Now 0 My pain is NA  In the last 24 hours, has pain interfered with the following? General activity 0 Relation with others 0 Enjoyment of life 0 What TIME of day is your pain at its worst? NA Sleep (in general) NA  Pain is worse with: NA Pain improves with: NA Relief from Meds: NA  Mobility use a cane do you drive?  yes Do you have any goals in this area?  yes  Function Do you have any goals in this area?  no  Neuro/Psych bowel control problems spasms dizziness  Prior Studies Any changes since last visit?  no  Physicians involved in your care Any changes since last visit?  no   Family History  Problem Relation Age of Onset  . Heart disease Mother 69    Died of MI at age 37 yo  . Heart disease Paternal Grandmother     requiring pacemaker.  [name] Heart disease Paternal Grandfather 33    Died of MI at possibly age 29-53yo  . Stroke Paternal Grandfather   . Heart disease Father 24    MI age 48yo requiring stenting  . Kidney disease Mother     requiring dialysis  . Congestive Heart Failure Mother   . Diabetes Father   . Diabetes Brother   . Heart disease Brother 45    MI at age 64 years old  . Breast cancer Paternal Aunt   . Breast cancer Maternal  Grandmother   . Glaucoma Father    Social History   Social History  . Marital Status: Single    Spouse Name: N/A  . Number of Children: 2  . Years of Education: 9th grade   Occupational History  . Unemployed     planning on getting disability   Social History Main Topics  . Smoking status: Never Smoker   . Smokeless tobacco: Never Used  . Alcohol Use: No  . Drug Use: No  . Sexual Activity:    Partners: Male    Birth Control/ Protection: Surgical   Other Topics Concern  . None   Social History Narrative   Lives in [place] with her son. Is able to read and write fluently in [place].   Past Surgical History  Procedure Laterality Date  . Tubal ligation  05/31/1985  . Carpal tunnel release Left     denies  . Hemiarthroplasty shoulder fracture Right 1980's    denies  . Multiple tooth extractions  ~ 2011    tumors removed ; "my whole top"  . Cardiac catheterization  05/2011  . Tee without cardioversion N/A 01/14/2014    Procedure: TRANSESOPHAGEAL ECHOCARDIOGRAM (TEE);  Surgeon: Thurmon Fair, MD;  Location: [facility] ENDOSCOPY;  Service: Cardiovascular;  Laterality: N/A;  . Breast surgery Bilateral 2011    patient reports benign results  . Bi-ventricular implantable cardioverter defibrillator  (crt-d)  08/2013    [name] 08/23/2013  . Bi-ventricular implantable cardioverter defibrillator N/A 08/22/2013    Procedure: BI-VENTRICULAR IMPLANTABLE CARDIOVERTER DEFIBRILLATOR  (CRT-D);  Surgeon: [name], MD;  Location: [facility] CATH LAB;  Service: Cardiovascular;  Laterality: N/A;  . Shoulder arthroscopy Right 12/26/2015    Procedure: Right Shoulder Arthroscopy, Debridement, and Decompression;  Surgeon: [name], MD;  Location: [facility] OR;  Service: Orthopedics;  Laterality: Right;   Past Medical History  Diagnosis Date  . Hypertension     Poorly controlled. Has had HTN since age 25. Angioedema with ACEI.  24 Hr urine and renal arterial dopplers ordered . . . Never done  . NICM  (nonischemic cardiomyopathy) (HCC)     EF 45-50% in 8/12, cath 6/12 showed normal coronaries, EF 50-55% by LV gram  . Morbid obesity (HCC)   . Allergic rhinitis   . HLD (hyperlipidemia)   . OSA on CPAP     sleep study in 8/12 showed moderate to severe OSA requiring CPAP  . Chronic lower back pain     secondary to DJD, obsetiy, hip problems. Followed by Dr. Ivory Broad (pain management)  . Chronic diastolic heart failure (HCC)      Primarily diastolic CHF: Likely due to uncontrolled HTN. Last echo (8/12) with EF 45-50%, mild to moderate LVH with some asymmetric septal hypertrophy, RV normal size and systolic function. EF 50-55% by LV-gram in 6/12.   . Polyneuropathy in diabetes(357.2)   . Thoracic or lumbosacral neuritis or radiculitis, unspecified   . Calcifying tendinitis of shoulder   . LBBB (left bundle branch block)   . Chronic combined systolic and diastolic CHF (congestive heart failure) (HCC)     EF 40-45% by echo 12/06/2012  . Coronary artery disease     questionable. LHC 05/2011 showing normal coronaries // Followed at [facility], Dr. Shirlee Latch  . GERD (gastroesophageal reflux disease)   . Automatic implantable cardioverter-defibrillator in situ   . Type II diabetes mellitus (HCC) DX: 2002  . 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 Degeneration of lumbar or lumbosacral intervertebral disc   . Arthritis     "hips, back, legs, arms" (07/04/2014)  . Frequent UTI   . Liver disease   . Presence of permanent cardiac pacemaker   . Stroke [facility]) 12/2013    "my left side is paralyzed" (07/04/2014)  . Shortness of breath     none now  . Asthma     hx  . Seizures (HCC)     last 3 months   BP 192/114 mmHg  Pulse 100  Resp 16  SpO2 96%  LMP 02/25/2016  Opioid Risk Score:   Fall Risk Score:  `1  Depression screen PHQ 2/9  Depression screen [facility] 2/9 02/03/2016 09/09/2015 09/02/2015 08/04/2015 07/07/2015 06/30/2015 06/11/2015  Decreased Interest 0 0 0 0 0 0 0    Down, Depressed, Hopeless 0 0 0 0 0 0 0  PHQ - 2 Score 0 0 0 0 0 0 0  Altered sleeping - - - - - - -  Tired, decreased energy - - - - - - -  Change in appetite - - - - - - -  Feeling bad or failure about yourself  - - - - - - -  Trouble concentrating - - - - - - -  Moving slowly or fidgety/restless - - - - - - -  Suicidal thoughts - - - - - - -  PHQ-9 Score - - - - - - -     Review of Systems  Constitutional:       Bowel control problems   Neurological: Positive for dizziness.       Spasms   All other systems reviewed and are negative.      Objective:   Physical Exam  Constitutional: She is oriented to person, place, and time. She appears well-developed and well-nourished.  HENT:  Head: Normocephalic and atraumatic.  Neck: Normal range of motion. Neck supple.  Cardiovascular: Normal rate and regular rhythm.  Pulmonary/Chest: Effort normal and breath sounds normal.  Musculoskeletal:  Normal Muscle Bulk and Muscle Testing Reveals: Upper Extremities: Right Decreased ROM 45 Degrees and Muscle Strength 4/5 Right AC Joint Tenderness Right knee with crepitus and mild effusion Left: Hemiparesis/ 0/5 Right Thoracic Paraspinal Tenderness: T-1- T-3 Lumbar Paraspinal Tenderness: L-3- L-5 Lower Extremities: Right: Full ROM and Muscle Strength 5/5 Left: Hemiparesis. Tone 2/4 left finger flexors/wrist. Bicep/BR are trace Neurological: She is alert and oriented to person, place, and time.  Skin: Skin is warm and dry.  Psychiatric: She has a normal mood and affect.  Nursing note and vitals reviewed.         Assessment & Plan:  1. Chronic low back pain: Continue Current Medication Regime. Refilled: oxyCODONE 10/325mg  one tablet every 8 hours as needed.#90.  2. Diabetic peripheral neuropathy:  Discussed importance of glycemic control 3. 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 OA: Continue Voltaren  gel  -After informed consent and preparation of the skin with betadine and isopropyl alcohol, I injected 6mg  (1cc) of celestone and 4cc of 1% lidocaine into the left knee via antero-lateral approach. Additionally, aspiration was performed prior to injection. The patient tolerated well, and no complications were encountered. Afterward the area was cleaned and dressed. Post- injection instructions were provided. 5. CHF per primary/cards     20 minutes of face to face patient care time was spent during this visit. All questions were encouraged and answered.

## 2016-03-03 NOTE — Patient Instructions (Signed)
CONTINUE TO WORK ON YOUR SUGARS!!!  PLEASE CALL ME WITH ANY PROBLEMS OR QUESTIONS (#[phone]).

## 2016-03-05 ENCOUNTER — Encounter: Payer: Self-pay | Admitting: Internal Medicine

## 2016-03-05 ENCOUNTER — Ambulatory Visit (INDEPENDENT_AMBULATORY_CARE_PROVIDER_SITE_OTHER): Payer: Medicaid Other | Admitting: Internal Medicine

## 2016-03-05 VITALS — BP 137/95 | HR 90 | Temp 97.6°F | Ht 69.0 in | Wt 271.3 lb

## 2016-03-05 DIAGNOSIS — Z794 Long term (current) use of insulin: Secondary | ICD-10-CM | POA: Diagnosis not present

## 2016-03-05 DIAGNOSIS — E1165 Type 2 diabetes mellitus with hyperglycemia: Secondary | ICD-10-CM | POA: Diagnosis not present

## 2016-03-05 DIAGNOSIS — IMO0002 Reserved for concepts with insufficient information to code with codable children: Secondary | ICD-10-CM

## 2016-03-05 DIAGNOSIS — I10 Essential (primary) hypertension: Secondary | ICD-10-CM

## 2016-03-05 DIAGNOSIS — E1140 Type 2 diabetes mellitus with diabetic neuropathy, unspecified: Secondary | ICD-10-CM | POA: Diagnosis not present

## 2016-03-05 LAB — GLUCOSE, CAPILLARY: Glucose-Capillary: 225 mg/dL — ABNORMAL HIGH (ref 65–99)

## 2016-03-05 NOTE — Patient Instructions (Signed)
1. Please make a follow up appointment for 6 weeks.   2. Please take all medications as previously prescribed.  NO changes today. If HBA1c is elevated at your next visit, we may have to add another medication.   3. If you have worsening of your symptoms or new symptoms arise, please call the clinic ([phone]), or go to the ER immediately if symptoms are severe.  You have done a great job in taking all your medications. Please continue to do this.

## 2016-03-05 NOTE — Progress Notes (Signed)
Internal Medicine Clinic Attending  Case discussed with Dr. Jones at the time of the visit.  We reviewed the resident's history and exam and pertinent patient test results.  I agree with the assessment, diagnosis, and plan of care documented in the resident's note.  

## 2016-03-05 NOTE — Assessment & Plan Note (Signed)
BP Readings from Last 3 Encounters:  03/05/16 137/95  03/03/16 192/114  02/03/16 153/89    Lab Results  Component Value Date   NA 138 12/23/2015   K 3.6 12/23/2015   CREATININE 0.77 12/23/2015    Assessment: Blood pressure control:  Controlled Progress toward BP goal:   At goal Comments: NO current issues.   Plan: Medications:  continue current medications Other plans: RTC in 6 weeks.

## 2016-03-05 NOTE — Assessment & Plan Note (Signed)
Lab Results  Component Value Date   HGBA1C 8.6* 12/23/2015   HGBA1C 7.0 09/09/2015   HGBA1C 7.2 04/22/2015     Assessment: Diabetes control:  Improving per patient Comments: Patient states her CBG's have been better since her last visit. Now using Lantus 45 units qhs + Metformin 1000 mg bid. Said her CBG have been slightly high over the past 2 days due to a recent knee injection.   Plan: Medications:  continue current medications. Re-evaluate at next clinic visit.  Home glucose monitoring: Frequency:  TID Instruction/counseling given: reminded to get eye exam, reminded to bring blood glucose meter & log to each visit, reminded to bring medications to each visit, discussed foot care, discussed the need for weight loss and discussed diet Other plans: Continue current meds. Repeat HbA1c at next visit.

## 2016-03-05 NOTE — Progress Notes (Signed)
Subjective:   Patient ID: [name] female   DOB: Oct 13, 1966 50 y.o.   MRN: [id number]  HPI: [name] is a 50 y.o. female w/ PMHx of HTN, HLD, uncontrolled DM type II, OSA, CAD, CHF, and h/o CVA w/ left-sided residual weakness, and seizures, presents to the clinic today for a follow up visit regarding her DM and other chronic medical issues. Patient states her blood sugars have been much better since her last visit. Patient is now using Lantus only once daily. Says the last 2 days it has been slightly elevated because she received a steroid injection in her knee. Her BP is well controlled today. No pains. Had recent episode of very high blood pressure, but within normal range today. No other issues.   Current Outpatient Prescriptions  Medication Sig Dispense Refill  . amLODipine (NORVASC) 10 MG tablet Take 1 tablet (10 mg total) by mouth daily. 90 tablet 1  . aspirin 81 MG tablet Take 1 tablet (81 mg total) by mouth daily. 90 tablet 6  . atorvastatin (LIPITOR) 40 MG tablet Take 1 tablet (40 mg total) by mouth daily. 90 tablet 3  . cyclobenzaprine (FLEXERIL) 10 MG tablet Take 1 tablet (10 mg total) by mouth 2 (two) times daily as needed for muscle spasms. 60 tablet 1  . diclofenac sodium (VOLTAREN) 1 % GEL Apply 2 g topically 4 (four) times daily as needed (pain). 5 Tube 5  . digoxin (LANOXIN) 0.125 MG tablet Take 1 tablet (0.125 mg total) by mouth daily. 30 tablet 5  . docusate sodium (COLACE) 50 MG capsule Take 1 capsule (50 mg total) by mouth 2 (two) times daily as needed for mild constipation. 60 capsule 6  . fluticasone (CUTIVATE) 0.05 % cream Apply topically 2 (two) times daily. Dispense two tubes (Patient taking differently: Apply 1 application topically 2 (two) times daily. ) 30 g 6  . furosemide (LASIX) 40 MG tablet Take 1 tablet (40 mg total) by mouth 2 (two) times a week. 10 tablet 6  . glucose blood (ACCU-CHEK SMARTVIEW) test strip Use to Check Blood Sugar TID. Code:  E11.40. 100 each 12  . hydrALAZINE (APRESOLINE) 100 MG tablet Take 1 tablet (100 mg total) by mouth 3 (three) times daily. 90 tablet 6  . ibuprofen (ADVIL,MOTRIN) 200 MG tablet Take 200 mg by mouth every 6 (six) hours as needed for moderate pain.    . Insulin Glargine (LANTUS SOLOSTAR) 100 UNIT/ML Solostar Pen Inject 45 Units into the skin daily at 10 pm. Inject 45 units in the PM. 45 mL 3  . Insulin Pen Needle (GLOBAL EASE INJECT PEN NEEDLES) 31G X 5 MM MISC Use to inject insulin BID. Code: E 11.40 100 each 10  . ipratropium (ATROVENT) 0.06 % nasal spray Place 2 sprays into both nostrils 4 (four) times daily. 15 mL 1  . isosorbide mononitrate (IMDUR) 60 MG 24 hr tablet Take 1.5 tablets (90 mg total) by mouth daily. 45 tablet 6  . levETIRAcetam (KEPPRA) 500 MG tablet Take 1 tablet (500 mg total) by mouth 2 (two) times daily. 60 tablet 5  . metFORMIN (GLUCOPHAGE) 1000 MG tablet Take 1 tablet (1,000 mg total) by mouth 2 (two) times daily with a meal. 180 tablet 1  . metoprolol succinate (TOPROL-XL) 100 MG 24 hr tablet Take 1 tablet (100 mg total) by mouth 2 (two) times daily. 60 tablet 0  . nystatin cream (MYCOSTATIN) Apply 1 application topically 2 (two) times daily. Apply under  abdominal skin and on buttocks 2 times daily until rash resolves. 30 g 0  . oxyCODONE-acetaminophen (PERCOCET) 10-325 MG tablet Take 1 tablet by mouth every 8 (eight) hours as needed for pain. 90 tablet 0  . pantoprazole (PROTONIX) 40 MG tablet Take 40 mg by mouth daily.    . polyethylene glycol (MIRALAX) packet Take 17 g by mouth daily. 14 each 1  . Potassium Chloride ER 20 MEQ TBCR Take 20 mEq by mouth daily. Only on days you take Lasix (Patient taking differently: Take 20 mEq by mouth daily as needed. Only on days you take Lasix) 90 tablet 3  . solifenacin (VESICARE) 10 MG tablet Take 10 mg by mouth daily.    [name] spironolactone (ALDACTONE) 25 MG tablet Take 1 tablet (25 mg total) by mouth daily. 30 tablet 3  . triamcinolone  (KENALOG) 0.025 % ointment Apply 1 application topically 2 (two) times daily. Apply to left arm until rash, itching resolves. 30 g 0  . Vitamin D, Ergocalciferol, (DRISDOL) 50000 UNITS CAPS capsule Take 1 capsule (50,000 Units total) by mouth every Monday. 24 capsule 0   No current facility-administered medications for this visit.   Review of Systems  General: Denies fever, diaphoresis, appetite change, and fatigue.  Respiratory: Denies SOB, cough, and wheezing.   Cardiovascular: Denies chest pain and palpitations.  Gastrointestinal: Denies nausea, vomiting, abdominal pain, and diarrhea Musculoskeletal: Denies myalgias, arthralgias, back pain, and gait problem.  Neurological: Denies dizziness, syncope, weakness, lightheadedness, and headaches.  Psychiatric/Behavioral: Denies mood changes, sleep disturbance, and agitation.    Objective:   Physical Exam: Filed Vitals:   03/05/16 1002  BP: 137/95  Pulse: 90  Temp: 97.6 F (36.4 C)  TempSrc: Oral  Height: 5\' 9"  (1.753 m)  Weight: 271 lb 4.8 oz (123.061 kg)  SpO2: 90%     General: Alert, cooperative, NAD. HEENT: PERRL, EOMI. Moist mucus membranes. Left-sided facial droop.  Neck: Full range of motion without pain, supple, no lymphadenopathy or carotid bruits Lungs: Clear to ascultation bilaterally, normal work of respiration, no wheezes, rales, rhonchi Heart: RRR, no murmurs, gallops, or rubs Abdomen: Soft, non-tender, non-distended, BS + Extremities: No cyanosis, clubbing, or edema. Left upper and lower extremity weakness.  Skin: Scaly, erythematous rash under abdomina pannus and on left arm. Arm with excoriations.  Neurologic: Alert & oriented X3, cranial nerves II-XII intact, 1/5 strength in LUE, 3/5 in LLE. Otherwise strength intact.   Assessment & Plan:   Please see problem based assessment and plan.

## 2016-03-08 LAB — TOXASSURE SELECT,+ANTIDEPR,UR: PDF: 0

## 2016-03-08 NOTE — Progress Notes (Signed)
Urine drug screen for this encounter is consistent for prescribed medication 

## 2016-03-11 ENCOUNTER — Other Ambulatory Visit (HOSPITAL_COMMUNITY): Payer: Self-pay | Admitting: Pharmacist

## 2016-03-11 DIAGNOSIS — I5032 Chronic diastolic (congestive) heart failure: Secondary | ICD-10-CM

## 2016-03-11 MED ORDER — SPIRONOLACTONE 25 MG PO TABS: 25.0000 mg | ORAL_TABLET | Freq: Every day | ORAL | Status: DC

## 2016-03-12 ENCOUNTER — Other Ambulatory Visit: Payer: Self-pay

## 2016-03-12 DIAGNOSIS — L259 Unspecified contact dermatitis, unspecified cause: Secondary | ICD-10-CM

## 2016-03-12 DIAGNOSIS — B372 Candidiasis of skin and nail: Secondary | ICD-10-CM

## 2016-03-15 MED ORDER — TRIAMCINOLONE ACETONIDE 0.025 % EX OINT: 1.0000 "application " | TOPICAL_OINTMENT | Freq: Two times a day (BID) | CUTANEOUS | Status: DC

## 2016-03-15 MED ORDER — CYCLOBENZAPRINE HCL 10 MG PO TABS: 10.0000 mg | ORAL_TABLET | Freq: Two times a day (BID) | ORAL | Status: DC | PRN

## 2016-03-15 MED ORDER — NYSTATIN 100000 UNIT/GM EX CREA: 1.0000 "application " | TOPICAL_CREAM | Freq: Two times a day (BID) | CUTANEOUS | Status: DC

## 2016-03-31 ENCOUNTER — Encounter: Payer: Self-pay | Admitting: Physical Medicine & Rehabilitation

## 2016-03-31 ENCOUNTER — Encounter: Payer: Medicaid Other | Attending: Physical Medicine & Rehabilitation | Admitting: Physical Medicine & Rehabilitation

## 2016-03-31 VITALS — BP 163/99 | HR 84 | Resp 14

## 2016-03-31 DIAGNOSIS — G8114 Spastic hemiplegia affecting left nondominant side: Secondary | ICD-10-CM

## 2016-03-31 DIAGNOSIS — Z79899 Other long term (current) drug therapy: Secondary | ICD-10-CM | POA: Diagnosis present

## 2016-03-31 DIAGNOSIS — G8110 Spastic hemiplegia affecting unspecified side: Secondary | ICD-10-CM | POA: Diagnosis not present

## 2016-03-31 DIAGNOSIS — G629 Polyneuropathy, unspecified: Secondary | ICD-10-CM | POA: Insufficient documentation

## 2016-03-31 DIAGNOSIS — Z5181 Encounter for therapeutic drug level monitoring: Secondary | ICD-10-CM | POA: Diagnosis present

## 2016-03-31 DIAGNOSIS — I63319 Cerebral infarction due to thrombosis of unspecified middle cerebral artery: Secondary | ICD-10-CM | POA: Diagnosis present

## 2016-03-31 DIAGNOSIS — I63311 Cerebral infarction due to thrombosis of right middle cerebral artery: Secondary | ICD-10-CM | POA: Diagnosis not present

## 2016-03-31 DIAGNOSIS — M47817 Spondylosis without myelopathy or radiculopathy, lumbosacral region: Secondary | ICD-10-CM | POA: Insufficient documentation

## 2016-03-31 DIAGNOSIS — E1342 Other specified diabetes mellitus with diabetic polyneuropathy: Secondary | ICD-10-CM | POA: Insufficient documentation

## 2016-03-31 DIAGNOSIS — E1142 Type 2 diabetes mellitus with diabetic polyneuropathy: Secondary | ICD-10-CM

## 2016-03-31 DIAGNOSIS — I5042 Chronic combined systolic (congestive) and diastolic (congestive) heart failure: Secondary | ICD-10-CM | POA: Diagnosis present

## 2016-03-31 MED ORDER — OXYCODONE-ACETAMINOPHEN 10-325 MG PO TABS: 1.0000 | ORAL_TABLET | Freq: Three times a day (TID) | ORAL | Status: DC | PRN

## 2016-03-31 NOTE — Patient Instructions (Signed)
  PLEASE CALL ME WITH ANY PROBLEMS OR QUESTIONS (#[phone]).      

## 2016-03-31 NOTE — Progress Notes (Signed)
Botox Injection for spasticity using needle EMG guidance Indication: G81.14 spastic hemiparesis left  Dilution: 100 Units/ml        Total Units Injected: 300 Indication: Severe spasticity which interferes with ADL,mobility and/or  hygiene and is unresponsive to medication management and other conservative care Informed consent was obtained after describing risks and benefits of the procedure with the patient. This includes bleeding, bruising, infection, excessive weakness, or medication side effects. A REMS form is on file and signed.  Needle: 37mm injectable monopolar needle electrode  Number of units per muscle Pectoralis Major 0 units Pectoralis Minor 0 units Biceps 0 units Brachioradialis 0 units FCR 25 units FCU 25 units FDS 75 units FDP 75 units FPL 50 units Pronator Teres 50 units Pronator Quadratus 0 units   All injections were done after obtaining appropriate EMG activity and after negative drawback for blood. The patient tolerated the procedure well. Post procedure instructions were given. A followup appointment was made.

## 2016-04-08 ENCOUNTER — Other Ambulatory Visit (HOSPITAL_COMMUNITY): Payer: Self-pay | Admitting: Family

## 2016-04-13 ENCOUNTER — Other Ambulatory Visit: Payer: Self-pay | Admitting: Internal Medicine

## 2016-04-13 NOTE — Telephone Encounter (Signed)
Celexa last filled 12/03/2013. Last appointment 03/05/2016.

## 2016-04-13 NOTE — Telephone Encounter (Signed)
Future appointment 04/20/2016.

## 2016-04-14 ENCOUNTER — Telehealth: Payer: Self-pay | Admitting: *Deleted

## 2016-04-14 NOTE — Telephone Encounter (Signed)
Patient is to take all her medications as previously prescribed. She is to take potassium (dose as prescribed) on the days she takes Lasix (twice weekly). She should also be taking her Toprol-XL 100 mg BID as is written in the most recent clinic notes. She also has an appointment with me on 04/20/16 next week. Please advise her to bring ALL of her medications to that appointment and to arrive on time.   Lauris Chroman, MD PGY-3, Internal Medicine Pager: [phone]

## 2016-04-14 NOTE — Telephone Encounter (Signed)
Spoke with patient to remind her of her appointment 04/20/2016 and to bring all medicines to her appointment.

## 2016-04-14 NOTE — Telephone Encounter (Signed)
Spoke with Cherise from [facility]. She was concerned that patient was still taking Klor-Con and spironolactone. Klor-Con was not on patient's active med list since 2016. Advised to tell patient to stop the Klor-Con. Patient also admitted to only taking 1 100mg . Tabs. Once per day of metoprolol instead of 2 times per day as prescribed. Cherise was concerned that patient's blood pressure was 180/100 on assessment. Advised Cherise to have patient take metoprolol as prescribed. Patient expressed concern that no one from [facility] had been to her house to set up equipment to check her own blood pressure. I was not able to find any reference to PC44 in patient's chart. Please advise.

## 2016-04-20 ENCOUNTER — Encounter: Payer: Medicaid Other | Admitting: Internal Medicine

## 2016-04-20 ENCOUNTER — Encounter: Payer: Self-pay | Admitting: Internal Medicine

## 2016-04-28 ENCOUNTER — Encounter: Payer: Medicaid Other | Attending: Physical Medicine & Rehabilitation | Admitting: Physical Medicine & Rehabilitation

## 2016-04-28 ENCOUNTER — Encounter: Payer: Self-pay | Admitting: Physical Medicine & Rehabilitation

## 2016-04-28 VITALS — BP 186/115 | HR 84

## 2016-04-28 DIAGNOSIS — I5042 Chronic combined systolic (congestive) and diastolic (congestive) heart failure: Secondary | ICD-10-CM | POA: Diagnosis present

## 2016-04-28 DIAGNOSIS — G8110 Spastic hemiplegia affecting unspecified side: Secondary | ICD-10-CM | POA: Diagnosis not present

## 2016-04-28 DIAGNOSIS — G629 Polyneuropathy, unspecified: Secondary | ICD-10-CM | POA: Insufficient documentation

## 2016-04-28 DIAGNOSIS — Z79899 Other long term (current) drug therapy: Secondary | ICD-10-CM | POA: Diagnosis present

## 2016-04-28 DIAGNOSIS — M47817 Spondylosis without myelopathy or radiculopathy, lumbosacral region: Secondary | ICD-10-CM | POA: Insufficient documentation

## 2016-04-28 DIAGNOSIS — E1342 Other specified diabetes mellitus with diabetic polyneuropathy: Secondary | ICD-10-CM | POA: Insufficient documentation

## 2016-04-28 DIAGNOSIS — Z5181 Encounter for therapeutic drug level monitoring: Secondary | ICD-10-CM | POA: Insufficient documentation

## 2016-04-28 DIAGNOSIS — E1142 Type 2 diabetes mellitus with diabetic polyneuropathy: Secondary | ICD-10-CM

## 2016-04-28 DIAGNOSIS — M47816 Spondylosis without myelopathy or radiculopathy, lumbar region: Secondary | ICD-10-CM

## 2016-04-28 DIAGNOSIS — I69354 Hemiplegia and hemiparesis following cerebral infarction affecting left non-dominant side: Secondary | ICD-10-CM

## 2016-04-28 DIAGNOSIS — I63311 Cerebral infarction due to thrombosis of right middle cerebral artery: Secondary | ICD-10-CM

## 2016-04-28 DIAGNOSIS — I5032 Chronic diastolic (congestive) heart failure: Secondary | ICD-10-CM

## 2016-04-28 DIAGNOSIS — G8114 Spastic hemiplegia affecting left nondominant side: Secondary | ICD-10-CM | POA: Diagnosis not present

## 2016-04-28 DIAGNOSIS — M1712 Unilateral primary osteoarthritis, left knee: Secondary | ICD-10-CM

## 2016-04-28 DIAGNOSIS — I63319 Cerebral infarction due to thrombosis of unspecified middle cerebral artery: Secondary | ICD-10-CM | POA: Diagnosis present

## 2016-04-28 MED ORDER — OXYCODONE-ACETAMINOPHEN 10-325 MG PO TABS: 1.0000 | ORAL_TABLET | Freq: Three times a day (TID) | ORAL | Status: DC | PRN

## 2016-04-28 NOTE — Progress Notes (Signed)
Subjective:    Patient ID: [name], female    DOB: 1966-03-03, 50 y.o.   MRN: [id number]  HPI  [name] is here in follow up of her chronic pain and spastic left hemiparesis. She had excellent results with botox injections last month. She has much more flexibility in the left arm and has been able to perform her ADL's much more easily. She has noticed some swelling in the left arm which is new.  Her pain levels are fairly consistent. She remains on the oxycodone for pain relief.   Pain Inventory Average Pain 6 Pain Right Now 6 My pain is aching  In the last 24 hours, has pain interfered with the following? General activity 5 Relation with others 5 Enjoyment of life 7 What TIME of day is your pain at its worst? evening Sleep (in general) Fair  Pain is worse with: walking and standing Pain improves with: medication and lay down Relief from Meds: 5  Mobility use a walker how many minutes can you walk? 0 ability to climb steps?  no do you drive?  yes needs help with transfers Do you have any goals in this area?  no  Function disabled: date disabled 1/ 2004 I need assistance with the following:  feeding, dressing, bathing, toileting, meal prep, household duties and shopping  Neuro/Psych bladder control problems weakness numbness tingling trouble walking spasms dizziness  Prior Studies Any changes since last visit?  no  Physicians involved in your care Any changes since last visit?  no   Family History  Problem Relation Age of Onset  . Heart disease Mother 7    Died of MI at age 40 yo  . Heart disease Paternal Grandmother     requiring pacemaker.  [name] Heart disease Paternal Grandfather 4    Died of MI at possibly age 44-53yo  . Stroke Paternal Grandfather   . Heart disease Father 83    MI age 70yo requiring stenting  . Kidney disease Mother     requiring dialysis  . Congestive Heart Failure Mother   . Diabetes Father   . Diabetes Brother   .  Heart disease Brother 49    MI at age 83 years old  . Breast cancer Paternal Aunt   . Breast cancer Maternal Grandmother   . Glaucoma Father    Social History   Social History  . Marital Status: Single    Spouse Name: N/A  . Number of Children: 2  . Years of Education: 9th grade   Occupational History  . Unemployed     planning on getting disability   Social History Main Topics  . Smoking status: Never Smoker   . Smokeless tobacco: Never Used  . Alcohol Use: No  . Drug Use: No  . Sexual Activity:    Partners: Male    Birth Control/ Protection: Surgical   Other Topics Concern  . None   Social History Narrative   Lives in [place] with her son. Is able to read and write fluently in [place].   Past Surgical History  Procedure Laterality Date  . Tubal ligation  05/31/1985  . Carpal tunnel release Left     denies  . Hemiarthroplasty shoulder fracture Right 1980's    denies  . Multiple tooth extractions  ~ 2011    tumors removed ; "my whole top"  . Cardiac catheterization  05/2011  . Tee without cardioversion N/A 01/14/2014    Procedure: TRANSESOPHAGEAL ECHOCARDIOGRAM (TEE);  Surgeon: Rachelle Hora  Croitoru, MD;  Location: [facility];  Service: Cardiovascular;  Laterality: N/A;  . Breast surgery Bilateral 2011    patient reports benign results  . Bi-ventricular implantable cardioverter defibrillator  (crt-d)  08/2013    [name] 08/23/2013  . Bi-ventricular implantable cardioverter defibrillator N/A 08/22/2013    Procedure: BI-VENTRICULAR IMPLANTABLE CARDIOVERTER DEFIBRILLATOR  (CRT-D);  Surgeon: [name], MD;  Location: [facility] CATH LAB;  Service: Cardiovascular;  Laterality: N/A;  . Shoulder arthroscopy Right 12/26/2015    Procedure: Right Shoulder Arthroscopy, Debridement, and Decompression;  Surgeon: [name], MD;  Location: [facility] OR;  Service: Orthopedics;  Laterality: Right;   Past Medical History  Diagnosis Date  . Hypertension     Poorly controlled. Has had HTN since  age 71. Angioedema with ACEI.  24 Hr urine and renal arterial dopplers ordered . . . Never done  . NICM (nonischemic cardiomyopathy) (HCC)     EF 45-50% in 8/12, cath 6/12 showed normal coronaries, EF 50-55% by LV gram  . Morbid obesity (HCC)   . Allergic rhinitis   . HLD (hyperlipidemia)   . OSA on CPAP     sleep study in 8/12 showed moderate to severe OSA requiring CPAP  . Chronic lower back pain     secondary to DJD, obsetiy, hip problems. Followed by Dr. Ivory Broad (pain management)  . Chronic diastolic heart failure (HCC)      Primarily diastolic CHF: Likely due to uncontrolled HTN. Last echo (8/12) with EF 45-50%, mild to moderate LVH with some asymmetric septal hypertrophy, RV normal size and systolic function. EF 50-55% by LV-gram in 6/12.   . Polyneuropathy in diabetes(357.2)   . Thoracic or lumbosacral neuritis or radiculitis, unspecified   . Calcifying tendinitis of shoulder   . LBBB (left bundle branch block)   . Chronic combined systolic and diastolic CHF (congestive heart failure) (HCC)     EF 40-45% by echo 12/06/2012  . Coronary artery disease     questionable. LHC 05/2011 showing normal coronaries // Followed at [facility], Dr. Shirlee Latch  . GERD (gastroesophageal reflux disease)   . Automatic implantable cardioverter-defibrillator in situ   . Type II diabetes mellitus (HCC) DX: 2002  . 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 Degeneration of lumbar or lumbosacral intervertebral disc   . Arthritis     "hips, back, legs, arms" (07/04/2014)  . Frequent UTI   . Liver disease   . Presence of permanent cardiac pacemaker   . Stroke [facility]) 12/2013    "my left side is paralyzed" (07/04/2014)  . Shortness of breath     none now  . Asthma     hx  . Seizures (HCC)     last 3 months   BP 186/115 mmHg  Pulse 84  SpO2 98%  Opioid Risk Score:   Fall Risk Score:  `1  Depression screen PHQ 2/9  Depression screen [facility] 2/9 03/05/2016 02/03/2016 09/09/2015  09/02/2015 08/04/2015 07/07/2015 06/30/2015  Decreased Interest 0 0 0 0 0 0 0  Down, Depressed, Hopeless 0 0 0 0 0 0 0  PHQ - 2 Score 0 0 0 0 0 0 0  Altered sleeping - - - - - - -  Tired, decreased energy - - - - - - -  Change in appetite - - - - - - -  Feeling bad or failure about yourself  - - - - - - -  Trouble concentrating - - - - - - -  Moving slowly or fidgety/restless - - - - - - -  Suicidal thoughts - - - - - - -  PHQ-9 Score - - - - - - -     Review of Systems     Objective:   Physical Exam  Constitutional: She is oriented to person, place, and time. She appears well-developed and well-nourished.  HENT:  Head: Normocephalic and atraumatic.  Neck: Normal range of motion. Neck supple.  Cardiovascular: Normal rate and regular rhythm.  Pulmonary/Chest: Effort normal and breath sounds normal.  Musculoskeletal: edema 1+ LUE, non-pitting Normal Muscle Bulk and Muscle Testing Reveals: Upper Extremities: Right Decreased ROM 45 Degrees and Muscle Strength 4/5 Right AC Joint Tenderness Right knee with crepitus and mild effusion Left: Hemiparesis/ 0/5 Right Thoracic Paraspinal Tenderness: T-1- T-3 Lumbar Paraspinal Tenderness: L-3- L-5 Lower Extremities: Right: Full ROM and Muscle Strength 5/5 Left: Hemiparesis. Tone 1/4 left finger flexors/wrist/forearm pronators. Bicep/BR are trace Neurological: She is alert and oriented to person, place, and time.  Skin: Skin is warm and dry.  Psychiatric: She has a normal mood and affect.  Nursing note and vitals reviewed.        Assessment & Plan:  1. Chronic low back pain: Continue Current Medication Regime. Refilled: oxyCODONE 10/325mg  one tablet every 8 hours as needed.#90. Second rx was given for next month. 2. Diabetic peripheral neuropathy: Discussed importance of glycemic control 3. 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 Much improved after botox---consider follow up injections  later this Summer -elevate LUE when in chair or bed. Consider evening ACE wrap also.  4. OA: Continue Voltaren gel  5. CHF and HTN: -we discussed at length the importance of better blood pressure control. It has been elevated for months and beyond.  -she will discuss with her primary tomorrow    20 minutes of face to face patient care time was spent during this visit. All questions were encouraged and answered.

## 2016-04-28 NOTE — Patient Instructions (Signed)
PLEASE CALL ME WITH ANY PROBLEMS OR QUESTIONS (#[phone]).      REVIEW YOUR BP WITH YOUR PRIMARY!!!!

## 2016-04-29 ENCOUNTER — Ambulatory Visit (INDEPENDENT_AMBULATORY_CARE_PROVIDER_SITE_OTHER): Payer: Medicaid Other | Admitting: Internal Medicine

## 2016-04-29 ENCOUNTER — Encounter: Payer: Self-pay | Admitting: Internal Medicine

## 2016-04-29 VITALS — BP 147/95 | HR 88 | Temp 98.4°F | Wt 269.7 lb

## 2016-04-29 DIAGNOSIS — IMO0002 Reserved for concepts with insufficient information to code with codable children: Secondary | ICD-10-CM

## 2016-04-29 DIAGNOSIS — E1140 Type 2 diabetes mellitus with diabetic neuropathy, unspecified: Secondary | ICD-10-CM | POA: Diagnosis not present

## 2016-04-29 DIAGNOSIS — Z79899 Other long term (current) drug therapy: Secondary | ICD-10-CM | POA: Diagnosis not present

## 2016-04-29 DIAGNOSIS — E1165 Type 2 diabetes mellitus with hyperglycemia: Secondary | ICD-10-CM

## 2016-04-29 DIAGNOSIS — I5042 Chronic combined systolic (congestive) and diastolic (congestive) heart failure: Secondary | ICD-10-CM

## 2016-04-29 DIAGNOSIS — I10 Essential (primary) hypertension: Secondary | ICD-10-CM

## 2016-04-29 DIAGNOSIS — I110 Hypertensive heart disease with heart failure: Secondary | ICD-10-CM | POA: Diagnosis not present

## 2016-04-29 DIAGNOSIS — Z794 Long term (current) use of insulin: Secondary | ICD-10-CM | POA: Diagnosis not present

## 2016-04-29 LAB — POCT GLYCOSYLATED HEMOGLOBIN (HGB A1C): HEMOGLOBIN A1C: 8.1

## 2016-04-29 LAB — GLUCOSE, CAPILLARY: Glucose-Capillary: 183 mg/dL — ABNORMAL HIGH (ref 65–99)

## 2016-04-29 MED ORDER — SPIRONOLACTONE 50 MG PO TABS: 50.0000 mg | ORAL_TABLET | Freq: Every day | ORAL | Status: DC

## 2016-04-29 MED ORDER — HYDRALAZINE HCL 50 MG PO TABS: 50.0000 mg | ORAL_TABLET | Freq: Three times a day (TID) | ORAL | Status: DC

## 2016-04-29 MED ORDER — CANAGLIFLOZIN 100 MG PO TABS: 100.0000 mg | ORAL_TABLET | Freq: Every day | ORAL | Status: DC

## 2016-04-29 NOTE — Progress Notes (Signed)
Subjective:   Patient ID: [name] female   DOB: 01/24/1966 50 y.o.   MRN: [id number]  HPI: [name] is a 50 y.o. female w/ PMHx of HTN, HLD, uncontrolled DM type II, OSA, CAD, CHF, and h/o CVA w/ left-sided residual weakness, and seizures, presents to the clinic today for a follow up visit regarding her DM and HTN. Patient has had a difficult time lately controlling her blood pressure despite her multiple medications. Yesterday at her PM&R appointment, patient had SBP in the 190's, DBP in the 110's. She was asymptomatic at that time. She also states that she has been doing somewhat better controlling her blood sugars, but the last few days her sugars have been somewhat elevated.   Otherwise she says she is doing well. Does describe a continued rash on her buttocks, appears to be small, scattered lesions, she states are itchy. Has been using both Nystatin cream and steroid cream without relief.   Current Outpatient Prescriptions  Medication Sig Dispense Refill  . amLODipine (NORVASC) 10 MG tablet Take 1 tablet (10 mg total) by mouth daily. 90 tablet 1  . aspirin 81 MG tablet Take 1 tablet (81 mg total) by mouth daily. 90 tablet 6  . atorvastatin (LIPITOR) 40 MG tablet Take 1 tablet (40 mg total) by mouth daily. 90 tablet 3  . citalopram (CELEXA) 20 MG tablet TAKE 1 TABLET BY MOUTH EVERY DAY 90 tablet 1  . cyclobenzaprine (FLEXERIL) 10 MG tablet Take 1 tablet (10 mg total) by mouth 2 (two) times daily as needed for muscle spasms. 60 tablet 1  . diclofenac sodium (VOLTAREN) 1 % GEL Apply 2 g topically 4 (four) times daily as needed (pain). 5 Tube 5  . digoxin (LANOXIN) 0.125 MG tablet Take 1 tablet (0.125 mg total) by mouth daily. 30 tablet 5  . docusate sodium (COLACE) 50 MG capsule Take 1 capsule (50 mg total) by mouth 2 (two) times daily as needed for mild constipation. 60 capsule 6  . fluticasone (CUTIVATE) 0.05 % cream Apply topically 2 (two) times daily. Dispense two  tubes (Patient taking differently: Apply 1 application topically 2 (two) times daily. ) 30 g 6  . furosemide (LASIX) 40 MG tablet Take 1 tablet (40 mg total) by mouth 2 (two) times a week. 10 tablet 6  . glucose blood (ACCU-CHEK SMARTVIEW) test strip Use to Check Blood Sugar TID. Code: E11.40. 100 each 12  . hydrALAZINE (APRESOLINE) 100 MG tablet Take 1 tablet (100 mg total) by mouth 3 (three) times daily. 90 tablet 6  . ibuprofen (ADVIL,MOTRIN) 200 MG tablet Take 200 mg by mouth every 6 (six) hours as needed for moderate pain.    . Insulin Glargine (LANTUS SOLOSTAR) 100 UNIT/ML Solostar Pen Inject 45 Units into the skin daily at 10 pm. Inject 45 units in the PM. 45 mL 3  . Insulin Pen Needle (GLOBAL EASE INJECT PEN NEEDLES) 31G X 5 MM MISC Use to inject insulin BID. Code: E 11.40 100 each 10  . ipratropium (ATROVENT) 0.06 % nasal spray Place 2 sprays into both nostrils 4 (four) times daily. 15 mL 1  . isosorbide mononitrate (IMDUR) 60 MG 24 hr tablet Take 1.5 tablets (90 mg total) by mouth daily. 45 tablet 6  . levETIRAcetam (KEPPRA) 500 MG tablet Take 1 tablet (500 mg total) by mouth 2 (two) times daily. 60 tablet 5  . metFORMIN (GLUCOPHAGE) 1000 MG tablet Take 1 tablet (1,000 mg total) by mouth 2 (  two) times daily with a meal. 180 tablet 1  . metoprolol succinate (TOPROL-XL) 100 MG 24 hr tablet Take 1 tablet (100 mg total) by mouth 2 (two) times daily. 60 tablet 0  . nystatin cream (MYCOSTATIN) APPLY UNDER ABDOMINAL SKIN AND ON BUTTOCKS 2 TIMES A DAY UNTIL RASH RESOLVES 30 g 0  . oxyCODONE-acetaminophen (PERCOCET) 10-325 MG tablet Take 1 tablet by mouth every 8 (eight) hours as needed for pain. 90 tablet 0  . pantoprazole (PROTONIX) 40 MG tablet Take 40 mg by mouth daily.    . polyethylene glycol (MIRALAX / GLYCOLAX) packet MIX 1 PACKET IN 8OZ OF LIQUID EVERY DAY 14 each 0  . Potassium Chloride ER 20 MEQ TBCR Take 20 mEq by mouth daily. Only on days you take Lasix (Patient taking differently: Take  20 mEq by mouth daily as needed. Only on days you take Lasix) 90 tablet 3  . solifenacin (VESICARE) 10 MG tablet Take 10 mg by mouth daily.    [name] spironolactone (ALDACTONE) 25 MG tablet Take 1 tablet (25 mg total) by mouth daily. 30 tablet 1  . triamcinolone (KENALOG) 0.025 % ointment APPLY TOPICALLY TO LEFT ARM 2 TIMES A DAY UNTIL RASH AND ITCHING RESOLVES 30 g 0  . Vitamin D, Ergocalciferol, (DRISDOL) 50000 UNITS CAPS capsule Take 1 capsule (50,000 Units total) by mouth every Monday. 24 capsule 0   No current facility-administered medications for this visit.   Review of Systems  General: Denies fever, diaphoresis, appetite change, and fatigue.  Respiratory: Denies SOB, cough, and wheezing.   Cardiovascular: Denies chest pain and palpitations.  Gastrointestinal: Denies nausea, vomiting, abdominal pain, and diarrhea Musculoskeletal: Denies myalgias, arthralgias, back pain, and gait problem.  Neurological: Denies dizziness, syncope, weakness, lightheadedness, and headaches.  Psychiatric/Behavioral: Denies mood changes, sleep disturbance, and agitation.    Objective:   Physical Exam: Filed Vitals:   04/29/16 1105  BP: 147/95  Pulse: 88  Temp: 98.4 F (36.9 C)  TempSrc: Oral  Weight: 269 lb 11.2 oz (122.335 kg)  SpO2: 99%     General: Alert, cooperative, NAD. HEENT: PERRL, EOMI. Moist mucus membranes. Left-sided facial droop.  Neck: Full range of motion without pain, supple, no lymphadenopathy or carotid bruits Lungs: Clear to ascultation bilaterally, normal work of respiration, no wheezes, rales, rhonchi Heart: RRR, no murmurs, gallops, or rubs Abdomen: Soft, non-tender, non-distended, BS + Extremities: No cyanosis, clubbing, or edema. Left upper and lower extremity weakness.  Skin: Scaly, erythematous rash under abdomina pannus and on left arm. Arm with excoriations.  Neurologic: Alert & oriented X3, cranial nerves II-XII intact, 1/5 strength in LUE, 3/5 in LLE. Otherwise  strength intact.   Assessment & Plan:   Please see problem based assessment and plan.

## 2016-04-29 NOTE — Patient Instructions (Addendum)
1. Please schedule a follow up appointment for 1-2 weeks. PLEASE DO NOT RESCHEDULE THIS APPOINTMENT.   2. Please take all medications as previously prescribed with the following changes:  Start taking Hydralazine 50 mg three times daily. This is a lower does than before.   Increase Spironolactone to 50 mg daily  Start taking Invokana 100 mg daily with the first meal of the day.   STOP your potassium supplementation.   Do not use Nystatin on rash on your back side.   3. If you have worsening of your symptoms or new symptoms arise, please call the clinic ([phone]), or go to the ER immediately if symptoms are severe.    Canagliflozin oral tablets What is this medicine? CANAGLIFLOZIN (KAN a gli FLOE zin) helps to treat type 2 diabetes. It helps to control blood sugar. Treatment is combined with diet and exercise. This medicine may be used for other purposes; ask your health care provider or pharmacist if you have questions. What should I tell my health care provider before I take this medicine? They need to know if you have any of these conditions: -dehydration -diabetic ketoacidosis -diet low in salt -eating less due to illness, surgery, dieting, or any other reason -having surgery -high cholesterol -high levels of potassium in the blood -history of pancreatitis or pancreas problems -history of yeast infection of the penis or vagina -if you often drink alcohol -infections in the bladder, kidneys, or urinary tract -kidney disease -liver disease -low blood pressure -on hemodialysis -problems urinating -type 1 diabetes -uncircumcised female -an unusual or allergic reaction to canagliflozin, other medicines, foods, dyes, or preservatives -pregnant or trying to get pregnant -breast-feeding How should I use this medicine? Take this medicine by mouth with a glass of water. Follow the directions on the prescription label. Take it before the first meal of the day. Take your dose at  the same time each day. Do not take more often than directed. Do not stop taking except on your doctor's advice. A special MedGuide will be given to you by the pharmacist with each prescription and refill. Be sure to read this information carefully each time. Talk to your pediatrician regarding the use of this medicine in children. Special care may be needed. Overdosage: If you think you have taken too much of this medicine contact a poison control center or emergency room at once. NOTE: This medicine is only for you. Do not share this medicine with others. What if I miss a dose? If you miss a dose, take it as soon as you can. If it is almost time for your next dose, take only that dose. Do not take double or extra doses. What may interact with this medicine? Do not take this medicine with any of the following medications: -gatifloxacinThis medicine may also interact with the following medications: -alcohol -certain medicines for blood pressure, heart disease -digoxin -diuretics -insulin -nateglinide -phenobarbital -phenytoin -repaglinide -rifampin -ritonavir -sulfonylureas like glimepiride, glipizide, glyburide This list may not describe all possible interactions. Give your health care provider a list of all the medicines, herbs, non-prescription drugs, or dietary supplements you use. Also tell them if you smoke, drink alcohol, or use illegal drugs. Some items may interact with your medicine. What should I watch for while using this medicine? Visit your doctor or health care professional for regular checks on your progress. This medicine can cause a serious condition in which there is too much acid in the blood. If you develop nausea, vomiting, stomach pain,  unusual tiredness, or breathing problems, stop taking this medicine and call your doctor right away. If possible, use a ketone dipstick to check for ketones in your urine. A test called the HbA1C (A1C) will be monitored. This is a  simple blood test. It measures your blood sugar control over the last 2 to 3 months. You will receive this test every 3 to 6 months. Learn how to check your blood sugar. Learn the symptoms of low and high blood sugar and how to manage them. Always carry a quick-source of sugar with you in case you have symptoms of low blood sugar. Examples include hard sugar candy or glucose tablets. Make sure others know that you can choke if you eat or drink when you develop serious symptoms of low blood sugar, such as seizures or unconsciousness. They must get medical help at once. Tell your doctor or health care professional if you have high blood sugar. You might need to change the dose of your medicine. If you are sick or exercising more than usual, you might need to change the dose of your medicine. Do not skip meals. Ask your doctor or health care professional if you should avoid alcohol. Many nonprescription cough and cold products contain sugar or alcohol. These can affect blood sugar. Wear a medical ID bracelet or chain, and carry a card that describes your disease and details of your medicine and dosage times. What side effects may I notice from receiving this medicine? Side effects that you should report to your doctor or health care professional as soon as possible: -allergic reactions like skin rash, itching or hives, swelling of the face, lips, or tongue -breathing problems -chest pain -dizziness -fast or irregular heartbeat -feeling faint or lightheaded, falls -muscle weakness -nausea, vomiting, unusual stomach upset or pain -new pain or tenderness, change in skin color, sores or ulcers, or infection in legs or feet -signs and symptoms of low blood sugar such as feeling anxious, confusion, dizziness, increased hunger, unusually weak or tired, sweating, shakiness, cold, irritable, headache, blurred vision, fast heartbeat, loss of consciousness -signs and symptoms of a urinary tract infection, such  as fever, chills, a burning feeling when urinating, blood in the urine, back pain -trouble passing urine or change in the amount of urine, including an urgent need to urinate more often, in larger amounts, or at night -penile discharge, itching, or pain in men -unusual tiredness -vaginal discharge, itching, or odor in women Side effects that usually do not require medical attention (Report these to your doctor or health care professional if they continue or are bothersome.): -constipation -mild increase in urination -thirsty This list may not describe all possible side effects. Call your doctor for medical advice about side effects. You may report side effects to FDA at 1-800-FDA-1088. Where should I keep my medicine? Keep out of the reach of children. Store at room temperature between 20 and 25 degrees C (68 and 77 degrees F). Throw away any unused medicine after the expiration date. NOTE: This sheet is a summary. It may not cover all possible information. If you have questions about this medicine, talk to your doctor, pharmacist, or health care provider.    2016, Elsevier/Gold Standard. (2015-04-30 14:33:36)

## 2016-04-30 LAB — DIGOXIN LEVEL: DIGOXIN, SERUM: 0.9 ng/mL (ref 0.5–0.9)

## 2016-04-30 LAB — BMP8+ANION GAP
ANION GAP: 21 mmol/L — AB (ref 10.0–18.0)
BUN / CREAT RATIO: 10 (ref 9–23)
BUN: 7 mg/dL (ref 6–24)
CHLORIDE: 99 mmol/L (ref 96–106)
CO2: 18 mmol/L (ref 18–29)
Calcium: 9.2 mg/dL (ref 8.7–10.2)
Creatinine, Ser: 0.67 mg/dL (ref 0.57–1.00)
GFR calc Af Amer: 119 mL/min/{1.73_m2} (ref >59)
GFR calc non Af Amer: 104 mL/min/{1.73_m2} (ref >59)
GLUCOSE: 175 mg/dL — AB (ref 65–99)
POTASSIUM: 4 mmol/L (ref 3.5–5.2)
SODIUM: 138 mmol/L (ref 134–144)

## 2016-04-30 LAB — MICROALBUMIN / CREATININE URINE RATIO
CREATININE, UR: 220.8 mg/dL
MICROALB/CREAT RATIO: 15.8 mg/g creat (ref 0.0–30.0)
Microalbumin, Urine: 34.8 ug/mL

## 2016-04-30 NOTE — Assessment & Plan Note (Signed)
Appears euvolemic currently. Compliant with medications.  -Increase Spironolactone to 50 mg daily for BP benefit.

## 2016-04-30 NOTE — Assessment & Plan Note (Signed)
Lab Results  Component Value Date   HGBA1C 8.1 04/29/2016   HGBA1C 8.6* 12/23/2015   HGBA1C 7.0 09/09/2015     Assessment: Diabetes control:  Not at goal Progress toward A1C goal:   Improving Comments: Patient using Lantus 45 units qhs, Metformin 1000 mg bid. Says she has been doing okay with this. Still needs better control.   Plan: Medications:  Add Invokana 100 mg daily. Think this will provide CV benefit as well for patient. Checked BMP, K, Cr wnl. Will need close monitoring of electrolytes given increase in dose of Spironolactone and addition of Invokana.  Home glucose monitoring: Frequency:  qd Instruction/counseling given: reminded to bring blood glucose meter & log to each visit, reminded to bring medications to each visit, discussed foot care, discussed the need for weight loss and discussed diet Other plans: RTC in 1-2 weeks

## 2016-04-30 NOTE — Progress Notes (Signed)
Medicine attending: Medical history, presenting problems, physical findings, and medications, reviewed with resident physician Dr Eden Jones on the day of the patient visit and I concur with his evaluation and management plan. 

## 2016-04-30 NOTE — Assessment & Plan Note (Signed)
BP Readings from Last 3 Encounters:  04/29/16 147/95  04/28/16 186/115  03/31/16 163/99    Lab Results  Component Value Date   NA 138 04/29/2016   K 4.0 04/29/2016   CREATININE 0.67 04/29/2016    Assessment: Blood pressure control:  Elevated Progress toward BP goal:   Not at goal Comments: Patient is taking Toprol-XL 100 mg bid, Imudr 90 mg daily, Lasix 40 mg twice weekly, Norvasc 10 mg daily, and Spironolactone 25 mg daily. She is also supposed to take Hydralazine 100 mg tid, although she states this makes her feel dizzy, weak, and nauseous. She says she took a lower dose in the past which didn't cause her as much trouble.   Plan: Medications:  Increase Spironolactone to 50 mg daily. Decrease Hydralazine to 50 mg tid to determine if she will tolerate the lower dose. Also starting Invokana 100 mg daily which may have a modest effect on her BP as well.  Other plans: Checked BMP today, K normal. Discontinue supplemental potassium given increase in Spirono and addition of Invokana.

## 2016-05-11 ENCOUNTER — Ambulatory Visit: Payer: Medicaid Other | Admitting: Internal Medicine

## 2016-05-14 ENCOUNTER — Encounter (INDEPENDENT_AMBULATORY_CARE_PROVIDER_SITE_OTHER): Payer: Medicaid Other | Admitting: Internal Medicine

## 2016-05-14 DIAGNOSIS — I5042 Chronic combined systolic (congestive) and diastolic (congestive) heart failure: Secondary | ICD-10-CM

## 2016-05-14 DIAGNOSIS — Z95810 Presence of automatic (implantable) cardiac defibrillator: Secondary | ICD-10-CM

## 2016-05-14 DIAGNOSIS — I429 Cardiomyopathy, unspecified: Secondary | ICD-10-CM | POA: Diagnosis not present

## 2016-05-17 ENCOUNTER — Other Ambulatory Visit: Payer: Self-pay | Admitting: Internal Medicine

## 2016-05-17 ENCOUNTER — Other Ambulatory Visit (HOSPITAL_COMMUNITY): Payer: Self-pay | Admitting: *Deleted

## 2016-05-17 DIAGNOSIS — I5032 Chronic diastolic (congestive) heart failure: Secondary | ICD-10-CM

## 2016-05-17 MED ORDER — DIGOXIN 125 MCG PO TABS: 0.1250 mg | ORAL_TABLET | Freq: Every day | ORAL | Status: DC

## 2016-05-18 ENCOUNTER — Encounter: Payer: Self-pay | Admitting: Internal Medicine

## 2016-05-24 ENCOUNTER — Encounter: Payer: Self-pay | Admitting: *Deleted

## 2016-05-28 ENCOUNTER — Encounter: Payer: Self-pay | Admitting: Internal Medicine

## 2016-05-28 ENCOUNTER — Ambulatory Visit (INDEPENDENT_AMBULATORY_CARE_PROVIDER_SITE_OTHER): Payer: Medicaid Other | Admitting: Internal Medicine

## 2016-05-28 VITALS — BP 160/104 | HR 103 | Temp 98.3°F | Wt 257.1 lb

## 2016-05-28 DIAGNOSIS — I10 Essential (primary) hypertension: Secondary | ICD-10-CM | POA: Diagnosis not present

## 2016-05-28 DIAGNOSIS — Z7984 Long term (current) use of oral hypoglycemic drugs: Secondary | ICD-10-CM | POA: Diagnosis not present

## 2016-05-28 DIAGNOSIS — IMO0002 Reserved for concepts with insufficient information to code with codable children: Secondary | ICD-10-CM

## 2016-05-28 DIAGNOSIS — E1140 Type 2 diabetes mellitus with diabetic neuropathy, unspecified: Secondary | ICD-10-CM | POA: Diagnosis not present

## 2016-05-28 DIAGNOSIS — E1165 Type 2 diabetes mellitus with hyperglycemia: Secondary | ICD-10-CM

## 2016-05-28 NOTE — Progress Notes (Signed)
Subjective:   Patient ID: [name] female   DOB: July 17, 1966 50 y.o.   MRN: [id number]  HPI: [name] is a 50 y.o. female w/ PMHx of HTN, HLD, uncontrolled DM type II, OSA, CAD, CHF, and h/o CVA w/ left-sided residual weakness, and seizures, presents to the clinic today for a follow up visit regarding her DM and HTN. Patient says she has been doing well with her recent medication changes but says she has been urinating more frequently and has lost quite a bit of weight since her last visit. She denies any significant dehydration or excessive thirst. She has been taking Hydralazine daily since she has been able to tolerate the smaller dose. Still has some intermittent symptoms of fatigue but feels generally well since she was last seen in the clinic.   Current Outpatient Prescriptions  Medication Sig Dispense Refill  . amLODipine (NORVASC) 10 MG tablet Take 1 tablet (10 mg total) by mouth daily. 90 tablet 1  . aspirin 81 MG tablet Take 1 tablet (81 mg total) by mouth daily. 90 tablet 6  . atorvastatin (LIPITOR) 40 MG tablet Take 1 tablet (40 mg total) by mouth daily. 90 tablet 3  . canagliflozin (INVOKANA) 100 MG TABS tablet Take 1 tablet (100 mg total) by mouth daily before breakfast. 30 tablet 2  . citalopram (CELEXA) 20 MG tablet TAKE 1 TABLET BY MOUTH EVERY DAY 90 tablet 1  . cyclobenzaprine (FLEXERIL) 10 MG tablet TAKE ONE TABLET BY MOUTH 2 TIMES A DAY IF NEEDED FOR MUSCLE SPASMS 60 tablet 0  . diclofenac sodium (VOLTAREN) 1 % GEL Apply 2 g topically 4 (four) times daily as needed (pain). 5 Tube 5  . digoxin (LANOXIN) 0.125 MG tablet Take 1 tablet (0.125 mg total) by mouth daily. 30 tablet 5  . docusate sodium (COLACE) 50 MG capsule Take 1 capsule (50 mg total) by mouth 2 (two) times daily as needed for mild constipation. 60 capsule 6  . fluticasone (CUTIVATE) 0.05 % cream Apply topically 2 (two) times daily. Dispense two tubes (Patient taking differently: Apply 1  application topically 2 (two) times daily. ) 30 g 6  . furosemide (LASIX) 40 MG tablet Take 1 tablet (40 mg total) by mouth 2 (two) times a week. 10 tablet 6  . glucose blood (ACCU-CHEK SMARTVIEW) test strip Use to Check Blood Sugar TID. Code: E11.40. 100 each 12  . hydrALAZINE (APRESOLINE) 50 MG tablet Take 1 tablet (50 mg total) by mouth 3 (three) times daily. 90 tablet 2  . Insulin Glargine (LANTUS SOLOSTAR) 100 UNIT/ML Solostar Pen Inject 45 Units into the skin daily at 10 pm. Inject 45 units in the PM. 45 mL 3  . Insulin Pen Needle (GLOBAL EASE INJECT PEN NEEDLES) 31G X 5 MM MISC Use to inject insulin BID. Code: E 11.40 100 each 10  . ipratropium (ATROVENT) 0.06 % nasal spray Place 2 sprays into both nostrils 4 (four) times daily. 15 mL 1  . isosorbide mononitrate (IMDUR) 60 MG 24 hr tablet Take 1.5 tablets (90 mg total) by mouth daily. 45 tablet 6  . levETIRAcetam (KEPPRA) 500 MG tablet Take 1 tablet (500 mg total) by mouth 2 (two) times daily. 60 tablet 5  . metFORMIN (GLUCOPHAGE) 1000 MG tablet TAKE 1 TABLET BY MOUTH TWO TIMES DAILY WITH A MEAL 180 tablet 1  . metoprolol succinate (TOPROL-XL) 100 MG 24 hr tablet Take 1 tablet (100 mg total) by mouth 2 (two) times daily. 60  tablet 0  . nystatin cream (MYCOSTATIN) APPLY UNDER ABDOMINAL SKIN AND ON BUTTOCKS 2 TIMES A DAY UNTIL RASH RESOLVES 30 g 0  . oxyCODONE-acetaminophen (PERCOCET) 10-325 MG tablet Take 1 tablet by mouth every 8 (eight) hours as needed for pain. 90 tablet 0  . pantoprazole (PROTONIX) 40 MG tablet Take 40 mg by mouth daily.    . polyethylene glycol (MIRALAX / GLYCOLAX) packet MIX 1 PACKET IN 8OZ OF LIQUID EVERY DAY 14 each 0  . solifenacin (VESICARE) 10 MG tablet Take 10 mg by mouth daily.    [name] spironolactone (ALDACTONE) 50 MG tablet Take 1 tablet (50 mg total) by mouth daily. 30 tablet 2  . triamcinolone (KENALOG) 0.025 % ointment APPLY TOPICALLY TO LEFT ARM 2 TIMES A DAY UNTIL RASH AND ITCHING RESOLVES 30 g 0  . Vitamin D,  Ergocalciferol, (DRISDOL) 50000 UNITS CAPS capsule Take 1 capsule (50,000 Units total) by mouth every Monday. 24 capsule 0   No current facility-administered medications for this visit.   Review of Systems  General: Positive for intermittent fatigue. Denies fever, diaphoresis, appetite change, and fatigue.  Respiratory: Denies SOB, cough, and wheezing.   Cardiovascular: Denies chest pain and palpitations.  Gastrointestinal: Denies nausea, vomiting, abdominal pain, and diarrhea Musculoskeletal: Denies myalgias, arthralgias, back pain, and gait problem.  Neurological: Denies dizziness, syncope, weakness, lightheadedness, and headaches.  Psychiatric/Behavioral: Denies mood changes, sleep disturbance, and agitation.    Objective:   Physical Exam: Filed Vitals:   05/28/16 1042  BP: 160/104  Pulse: 103  Temp: 98.3 F (36.8 C)  TempSrc: Oral  Weight: 257 lb 1.6 oz (116.62 kg)  SpO2: 100%     General: Alert, cooperative, NAD. HEENT: PERRL, EOMI. Moist mucus membranes. Left-sided facial droop.  Neck: Full range of motion without pain, supple, no lymphadenopathy or carotid bruits Lungs: Clear to ascultation bilaterally, normal work of respiration, no wheezes, rales, rhonchi Heart: RRR, no murmurs, gallops, or rubs Abdomen: Soft, non-tender, non-distended, BS + Extremities: No cyanosis, clubbing, or edema. Left upper and lower extremity weakness.  Skin: Scaly, erythematous rash under abdomina pannus and on left arm. Arm with excoriations.  Neurologic: Alert & oriented X3, cranial nerves II-XII intact, 1/5 strength in LUE, 3/5 in LLE. Otherwise strength intact.   Assessment & Plan:   Please see problem based assessment and plan.

## 2016-05-28 NOTE — Assessment & Plan Note (Signed)
BP Readings from Last 3 Encounters:  05/28/16 160/104  04/29/16 147/95  04/28/16 186/115    Lab Results  Component Value Date   NA 138 04/29/2016   K 4.0 04/29/2016   CREATININE 0.67 04/29/2016    Assessment: Blood pressure control:  Elevated Progress toward BP goal:   Not at goal Comments: Patient DID NOT take her BP medications today, although she states she has been compliant with her medications otherwise. Since her last visit, she has been taking Toprol-XL 100 mg bid, Imdur 90 mg daily, Spironolactone 50 mg daily (since last visit), and Hydralazine 50 mg tid. Was previously taking Spirono 25 mg daily, and Hydralazine 100 mg tid, however, she was not taking the Hydralazine AT ALL because she could not tolerate this dose due to lightheadedness, nausea, and fatigue. Since the dose was decreased, she says she has tolerated this much better and has in fact been taking it three times daily on most days.   Plan: Medications:  continue current medications. Reinforced importance of taking her medications at the same time every day and to make sure she takes her BP medications prior to her next clinic visit.  Other plans: Recheck BMP today to ensure normal potassium since increasing Spironolactone, adding Invokana, and stopping potassium supplementation.

## 2016-05-28 NOTE — Assessment & Plan Note (Signed)
Lab Results  Component Value Date   HGBA1C 8.1 04/29/2016   HGBA1C 8.6* 12/23/2015   HGBA1C 7.0 09/09/2015     Assessment: Diabetes control:  Poor control Progress toward A1C goal:   Not at goal Comments: Has not been checking her CBG's regularly since she was seen in the clinic last. Has been tolerating the Invokana but says she has been going to the bathroom a lot since starting this medication and has also lost quite a bit of weight.   Plan: Medications:  continue current medications Home glucose monitoring: Frequency:  Reinforced importance of regular CBG checks since starting Invokana.  Timing:  bid-tid Instruction/counseling given: reminded to bring blood glucose meter & log to each visit Other plans: Check BMP. RTC in 2-4 weeks.

## 2016-05-28 NOTE — Patient Instructions (Signed)
1. Please make a follow up for 2-4 weeks.   I will call you regarding your labs.   2. Please take all medications as previously prescribed.   See the list attached with the appropriate dosages.   3. If you have worsening of your symptoms or new symptoms arise, please call the clinic ([phone]), or go to the ER immediately if symptoms are severe.  You have done a great job in taking all your medications. Please continue to do this.

## 2016-05-29 LAB — BMP8+ANION GAP
ANION GAP: 19 mmol/L — AB (ref 10.0–18.0)
BUN/Creatinine Ratio: 13 (ref 9–23)
BUN: 9 mg/dL (ref 6–24)
CO2: 22 mmol/L (ref 18–29)
CREATININE: 0.72 mg/dL (ref 0.57–1.00)
Calcium: 9.7 mg/dL (ref 8.7–10.2)
Chloride: 97 mmol/L (ref 96–106)
GFR calc Af Amer: 114 mL/min/{1.73_m2} (ref >59)
GFR calc non Af Amer: 99 mL/min/{1.73_m2} (ref >59)
Glucose: 226 mg/dL — ABNORMAL HIGH (ref 65–99)
Potassium: 4.1 mmol/L (ref 3.5–5.2)
SODIUM: 138 mmol/L (ref 134–144)

## 2016-05-31 NOTE — Progress Notes (Signed)
Internal Medicine Clinic Attending  Case discussed with Dr. Jones at the time of the visit.  We reviewed the resident's history and exam and pertinent patient test results.  I agree with the assessment, diagnosis, and plan of care documented in the resident's note.  

## 2016-06-01 ENCOUNTER — Other Ambulatory Visit: Payer: Self-pay | Admitting: Internal Medicine

## 2016-06-01 ENCOUNTER — Telehealth (HOSPITAL_COMMUNITY): Payer: Self-pay | Admitting: *Deleted

## 2016-06-01 NOTE — Telephone Encounter (Signed)
Pharmacy called to get a refill on pt's Vitamin D, he had previously prescribed it but since pt now has pcp advised med needs to come from them, they will contact Dr Yetta Barre

## 2016-06-03 ENCOUNTER — Other Ambulatory Visit: Payer: Self-pay | Admitting: Internal Medicine

## 2016-06-08 NOTE — Telephone Encounter (Signed)
Needs mid July appt

## 2016-06-17 ENCOUNTER — Other Ambulatory Visit: Payer: Self-pay | Admitting: Internal Medicine

## 2016-06-17 ENCOUNTER — Other Ambulatory Visit (HOSPITAL_COMMUNITY): Payer: Self-pay | Admitting: Internal Medicine

## 2016-06-17 ENCOUNTER — Other Ambulatory Visit: Payer: Self-pay | Admitting: Cardiology
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
## 2016-06-28 ENCOUNTER — Encounter: Payer: Medicaid Other | Attending: Physical Medicine & Rehabilitation | Admitting: Physical Medicine & Rehabilitation

## 2016-06-28 ENCOUNTER — Encounter: Payer: Self-pay | Admitting: Physical Medicine & Rehabilitation

## 2016-06-28 VITALS — BP 160/113 | HR 121

## 2016-06-28 DIAGNOSIS — Z79899 Other long term (current) drug therapy: Secondary | ICD-10-CM | POA: Diagnosis not present

## 2016-06-28 DIAGNOSIS — E1142 Type 2 diabetes mellitus with diabetic polyneuropathy: Secondary | ICD-10-CM

## 2016-06-28 DIAGNOSIS — G8114 Spastic hemiplegia affecting left nondominant side: Secondary | ICD-10-CM

## 2016-06-28 DIAGNOSIS — E1140 Type 2 diabetes mellitus with diabetic neuropathy, unspecified: Secondary | ICD-10-CM

## 2016-06-28 DIAGNOSIS — G629 Polyneuropathy, unspecified: Secondary | ICD-10-CM | POA: Diagnosis present

## 2016-06-28 DIAGNOSIS — E1342 Other specified diabetes mellitus with diabetic polyneuropathy: Secondary | ICD-10-CM | POA: Insufficient documentation

## 2016-06-28 DIAGNOSIS — I63319 Cerebral infarction due to thrombosis of unspecified middle cerebral artery: Secondary | ICD-10-CM | POA: Diagnosis present

## 2016-06-28 DIAGNOSIS — Z5181 Encounter for therapeutic drug level monitoring: Secondary | ICD-10-CM | POA: Insufficient documentation

## 2016-06-28 DIAGNOSIS — M47817 Spondylosis without myelopathy or radiculopathy, lumbosacral region: Secondary | ICD-10-CM | POA: Insufficient documentation

## 2016-06-28 DIAGNOSIS — E1165 Type 2 diabetes mellitus with hyperglycemia: Secondary | ICD-10-CM

## 2016-06-28 DIAGNOSIS — IMO0002 Reserved for concepts with insufficient information to code with codable children: Secondary | ICD-10-CM

## 2016-06-28 DIAGNOSIS — G8110 Spastic hemiplegia affecting unspecified side: Secondary | ICD-10-CM | POA: Insufficient documentation

## 2016-06-28 DIAGNOSIS — I5042 Chronic combined systolic (congestive) and diastolic (congestive) heart failure: Secondary | ICD-10-CM | POA: Insufficient documentation

## 2016-06-28 DIAGNOSIS — I63311 Cerebral infarction due to thrombosis of right middle cerebral artery: Secondary | ICD-10-CM

## 2016-06-28 MED ORDER — CYCLOBENZAPRINE HCL 10 MG PO TABS: ORAL_TABLET | ORAL | Status: DC

## 2016-06-28 MED ORDER — OXYCODONE-ACETAMINOPHEN 10-325 MG PO TABS: 1.0000 | ORAL_TABLET | Freq: Three times a day (TID) | ORAL | Status: DC | PRN

## 2016-06-28 NOTE — Patient Instructions (Addendum)
PLEASE CALL ME WITH ANY PROBLEMS OR QUESTIONS [phone])   TAKE CYCLOBENZAPRINE 3 X DAILY ON SCHEDULE FOR 1-2 WEEKS THEN BACK TO AS NEEDED.  GENEROUS ICE TO YOUR LEFT KNEE. YOU MAY USE HEAT OR ICE TO YOUR BACK.    CHECK WITH YOUR CARDIOLOGIST ABOUT THE SAFETY OF TAKING IBUPROFEN AGAIN. I WOULD  BE WILLING TO PRESCRIBE A  LOW DOSE IF THEYRE OK WITH IT.

## 2016-06-28 NOTE — Progress Notes (Signed)
Subjective:    Patient ID: [name], female    DOB: 1965-12-19, 50 y.o.   MRN: [id number]  HPI   [name] is here in follow up of her spastic left hemiparesis. She had very nice results with the most recent botoxn. Unfortunately, she had a seizure two weeks ago. She was in the hospital at [facility]. She had gotten dehydrated and apparently had a UTI which likely triggered the episode. She had been keeping up with her medications. She received an abx and IVF and was discharged home. She had a fall associated with the seizure episode. Her low back pain increased since the fall. She feels that the left knee was "stretched" out as well.   Her pain is improving somewhat but still more than before. She is drinking more water to keep up with her hydration.       Pain Inventory Average Pain 7 Pain Right Now 7 My pain is tingling and aching  In the last 24 hours, has pain interfered with the following? General activity 7 Relation with others 7 Enjoyment of life 7 What TIME of day is your pain at its worst? night Sleep (in general) Fair  Pain is worse with: standing and some activites Pain improves with: rest, heat/ice, therapy/exercise and medication Relief from Meds: 2  Mobility use a cane ability to climb steps?  no Do you have any goals in this area?  yes  Function disabled: date disabled .  Neuro/Psych No problems in this area  Prior Studies Any changes since last visit?  no  Physicians involved in your care Any changes since last visit?  no   Family History  Problem Relation Age of Onset  . Heart disease Mother 63    Died of MI at age 25 yo  . Heart disease Paternal Grandmother     requiring pacemaker.  [name] Heart disease Paternal Grandfather 65    Died of MI at possibly age 14-53yo  . Stroke Paternal Grandfather   . Heart disease Father 35    MI age 37yo requiring stenting  . Kidney disease Mother     requiring dialysis  . Congestive Heart Failure Mother   .  Diabetes Father   . Diabetes Brother   . Heart disease Brother 55    MI at age 62 years old  . Breast cancer Paternal Aunt   . Breast cancer Maternal Grandmother   . Glaucoma Father    Social History   Social History  . Marital Status: Single    Spouse Name: N/A  . Number of Children: 2  . Years of Education: 9th grade   Occupational History  . Unemployed     planning on getting disability   Social History Main Topics  . Smoking status: Never Smoker   . Smokeless tobacco: Never Used  . Alcohol Use: No  . Drug Use: No  . Sexual Activity: Not Asked   Other Topics Concern  . None   Social History Narrative   Lives in [place] with her son. Is able to read and write fluently in [place].   Past Surgical History  Procedure Laterality Date  . Tubal ligation  05/31/1985  . Carpal tunnel release Left     denies  . Hemiarthroplasty shoulder fracture Right 1980's    denies  . Multiple tooth extractions  ~ 2011    tumors removed ; "my whole top"  . Cardiac catheterization  05/2011  . Tee without cardioversion N/A 01/14/2014  Procedure: TRANSESOPHAGEAL ECHOCARDIOGRAM (TEE);  Surgeon: Thurmon Fair, MD;  Location: [facility] ENDOSCOPY;  Service: Cardiovascular;  Laterality: N/A;  . Breast surgery Bilateral 2011    patient reports benign results  . Bi-ventricular implantable cardioverter defibrillator  (crt-d)  08/2013    [name] 08/23/2013  . Bi-ventricular implantable cardioverter defibrillator N/A 08/22/2013    Procedure: BI-VENTRICULAR IMPLANTABLE CARDIOVERTER DEFIBRILLATOR  (CRT-D);  Surgeon: [name], MD;  Location[facility] CATH LAB;  Service: Cardiovascular;  Laterality: N/A;  . Shoulder arthroscopy Right 12/26/2015    Procedure: Right Shoulder Arthroscopy, Debridement, and Decompression;  Surgeon: [name], MD;  Location: [facility] OR;  Service: Orthopedics;  Laterality: Right;   Past Medical History  Diagnosis Date  . Hypertension     Poorly controlled. Has had HTN since age  32. Angioedema with ACEI.  24 Hr urine and renal arterial dopplers ordered . . . Never done  . NICM (nonischemic cardiomyopathy) (HCC)     EF 45-50% in 8/12, cath 6/12 showed normal coronaries, EF 50-55% by LV gram  . Morbid obesity (HCC)   . Allergic rhinitis   . HLD (hyperlipidemia)   . OSA on CPAP     sleep study in 8/12 showed moderate to severe OSA requiring CPAP  . Chronic lower back pain     secondary to DJD, obsetiy, hip problems. Followed by Dr. Ivory Broad (pain management)  . Chronic diastolic heart failure (HCC)      Primarily diastolic CHF: Likely due to uncontrolled HTN. Last echo (8/12) with EF 45-50%, mild to moderate LVH with some asymmetric septal hypertrophy, RV normal size and systolic function. EF 50-55% by LV-gram in 6/12.   . Polyneuropathy in diabetes(357.2)   . Thoracic or lumbosacral neuritis or radiculitis, unspecified   . Calcifying tendinitis of shoulder   . LBBB (left bundle branch block)   . Chronic combined systolic and diastolic CHF (congestive heart failure) (HCC)     EF 40-45% by echo 12/06/2012  . Coronary artery disease     questionable. LHC 05/2011 showing normal coronaries // Followed at [facility], Dr. Shirlee Latch  . GERD (gastroesophageal reflux disease)   . Automatic implantable cardioverter-defibrillator in situ   . Type II diabetes mellitus (HCC) DX: 2002  . 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 Degeneration of lumbar or lumbosacral intervertebral disc   . Arthritis     "hips, back, legs, arms" (07/04/2014)  . Frequent UTI   . Liver disease   . Presence of permanent cardiac pacemaker   . Stroke [facility]) 12/2013    "my left side is paralyzed" (07/04/2014)  . Shortness of breath     none now  . Asthma     hx  . Seizures (HCC)     last 3 months   BP 160/113 mmHg  Pulse 121  SpO2 97%  Opioid Risk Score:   Fall Risk Score:  `1  Depression screen PHQ 2/9  Depression screen [facility] 2/9 04/29/2016 03/05/2016 02/03/2016  09/09/2015 09/02/2015 08/04/2015 07/07/2015  Decreased Interest 0 0 0 0 0 0 0  Down, Depressed, Hopeless 0 0 0 0 0 0 0  PHQ - 2 Score 0 0 0 0 0 0 0  Altered sleeping - - - - - - -  Tired, decreased energy - - - - - - -  Change in appetite - - - - - - -  Feeling bad or failure about yourself  - - - - - - -  Trouble concentrating - - - - - - -  Moving slowly or fidgety/restless - - - - - - -  Suicidal thoughts - - - - - - -  PHQ-9 Score - - - - - - -     Review of Systems  Constitutional: Negative.   HENT: Negative.   Eyes: Negative.   Respiratory: Negative.   Cardiovascular: Negative.   Gastrointestinal: Negative.   Endocrine: Negative.   Genitourinary: Negative.   Musculoskeletal: Positive for back pain, gait problem and neck pain.  Skin: Negative.   Allergic/Immunologic: Negative.   Neurological: Positive for seizures.  Hematological: Negative.   Psychiatric/Behavioral: Negative.        Objective:   Physical Exam Constitutional: She is oriented to person, place, and time. She appears well-developed and well-nourished.  HENT:  Head: Normocephalic and atraumatic.  Neck: Normal range of motion. Neck supple.  Cardiovascular: Normal rate and regular rhythm.  Pulmonary/Chest: Effort normal and breath sounds normal.  Musculoskeletal: edema 1+ LUE, non-pitting Normal Muscle Bulk and Muscle Testing Reveals: Upper Extremities: Right Decreased ROM 45 Degrees and Muscle Strength 4/5 Right AC Joint Tenderness  Right knee with mild crepitus Left knee with abrasions. No obvious joint effusions. Appears to move freely. Minimal creptius. Recurvatum left knee during stance phase of gait.  Left: Hemiparesis/ 0/5 Right Thoracic Paraspinal Tenderness: T-1- T-3 Lumbar Paraspinal Tenderness: L-3- L-5. These muscles are VERY taut today Lower Extremities: Right: Full ROM and Muscle Strength 5/5 Left: Hemiparesis. Tone tr/4 left finger flexors/wrist/forearm pronators. Bicep/BR   trace Neurological: She is alert and oriented to person, place, and time.  Skin: Skin is warm and dry. A few abrasions on upper back Psychiatric: She has a normal mood and affect.  Nursing note and vitals reviewed.   Assessment & Plan:   1. Chronic low back pain: Continue Current Medication Regime. Refilled: oxyCODONE 10/325mg  one tablet every 8 hours as needed.#90.  -I believe this recent flare is mostly mechanical after her fall. A lot of muscle spasm -scheduled flexeril 10mg  TID for 1-2 weeks -regular heat/ice to back 2. Diabetic peripheral neuropathy: Discussed importance of glycemic control  3. Hx of Right MCA infarct: with spastic left hemiparesis  -continue HEP. Much improved after botox---consider follow up injections later this Summer  -elevate LUE when in chair or bed. Consider evening ACE wrap also.  -recent seizure due to UTI, dehydration 4. OA: Continue Voltaren gel to both knees -ice TID+ to left knee -discussed the importance of gait mechanics also -she will discuss with cardiology about the safety of using a low dose NSAID such as ibuprofen (apparently she was told by her primary that she could). i would prefer to wait for cards opinion. 5. CHF and HTN:  -we discussed at length the importance of better blood pressure control. It has been elevated for months and beyond.  -we also reviewed the importance of fluid intake but not to over-consume liquids  30 minutes of face to face patient care time was spent during this visit. All questions were encouraged and answered.         v

## 2016-06-30 ENCOUNTER — Encounter (HOSPITAL_COMMUNITY): Payer: Medicaid Other

## 2016-07-06 LAB — TOXASSURE SELECT,+ANTIDEPR,UR: PDF: 0

## 2016-07-06 NOTE — Progress Notes (Signed)
Urine drug screen for this encounter is consistent for prescribed medications.   

## 2016-07-14 ENCOUNTER — Ambulatory Visit (INDEPENDENT_AMBULATORY_CARE_PROVIDER_SITE_OTHER): Payer: Medicaid Other | Admitting: Internal Medicine

## 2016-07-14 ENCOUNTER — Telehealth: Payer: Self-pay

## 2016-07-14 VITALS — BP 177/109 | HR 98 | Temp 97.8°F | Ht 69.0 in | Wt 260.7 lb

## 2016-07-14 DIAGNOSIS — R21 Rash and other nonspecific skin eruption: Secondary | ICD-10-CM

## 2016-07-14 DIAGNOSIS — L299 Pruritus, unspecified: Secondary | ICD-10-CM

## 2016-07-14 DIAGNOSIS — I10 Essential (primary) hypertension: Secondary | ICD-10-CM

## 2016-07-14 HISTORY — DX: Rash and other nonspecific skin eruption: R21

## 2016-07-14 MED ORDER — MUPIROCIN CALCIUM 2 % EX CREA: 1.0000 "application " | TOPICAL_CREAM | Freq: Two times a day (BID) | CUTANEOUS | 2 refills | Status: DC

## 2016-07-14 MED ORDER — CALAMINE EX LOTN: 1.0000 "application " | TOPICAL_LOTION | CUTANEOUS | 0 refills | Status: DC | PRN

## 2016-07-14 NOTE — Patient Instructions (Signed)
Use muprocin/bactroban cream on the lesions 2 or 3 times per day. You can use calamine lotion at a different time for itching.   We have referred you to Dermatology.  If the lesions worsen, spread to the fingers or other members of the family get the lesions, please let us know as it could be scabies.  Follow up in 2-4 weeks with your primary care doctor.

## 2016-07-14 NOTE — Assessment & Plan Note (Signed)
BP Readings from Last 3 Encounters:  07/14/16 (!) 177/109  06/28/16 (!) 160/113  05/28/16 (!) 160/104    Lab Results  Component Value Date   NA 138 05/28/2016   K 4.1 05/28/2016   CREATININE 0.72 05/28/2016    Assessment: Blood pressure control:  Uncontrolled Progress toward BP goal:   Deteriorated Comments: patient did not take her medications yet today  Plan: Medications:  continue current medications Educational resources provided:   Self management tools provided:   Other plans: Follow up with PCP in 2 weeks

## 2016-07-14 NOTE — Assessment & Plan Note (Addendum)
Patient presents with a 3 week history of diffuse pruritic non-painful rash on her right flank extending across her abdomen and to her buttocks. There are smaller lesions on posterior neck, left leg and left arm. She denies any household members with similar lesions. No history of scabies or lesions in finger webs. No associated fever or chills. No new detergents, soaps, lotions etc. Nystatin seems to work somewhat but it comes back. It is more pruritic at night. Physical exam reveals diffuse small lesions with crusted scabs on right lower flank extending over buttocks and round right side to abdomen. Small patches of similar lesions on left abdomen, left leg, and posterior neck. No lesions in webs of fingers, face, groin. Excoriations presents. Lesions are not vesicular, some pustular and papular. No evidence of intertrigo. No surrounding erythema.   Assessment: Pustular/Papular Diffuse Rash- doubt fungal infection, scabies or dermatitis  Plan: -Referral to Dermatology -Muprocin cream  -Calamine lotion

## 2016-07-14 NOTE — Progress Notes (Signed)
CC: rash  HPI: [name] is a 50 y.o. female with PMHx of NICM s/p ICD, HTN, h/o CVA, T2DM who presents to the clinic for a rash.   Patient presents with a 3 week history of diffuse pruritic non-painful rash on her right flank extending across her abdomen and to her buttocks. There are smaller lesions on posterior neck, left leg and left arm. She denies any household members with similar lesions. No history of scabies or lesions in finger webs. No associated fever or chills. No new detergents, soaps, lotions etc. Nystatin seems to work somewhat but it comes back. It is more pruritic at night.   Past Medical History:  Diagnosis Date  . Allergic rhinitis   . Arthritis    "hips, back, legs, arms" (07/04/2014)  . Asthma    hx  . Automatic implantable cardioverter-defibrillator in situ   . Calcifying tendinitis of shoulder   . Chronic combined systolic and diastolic CHF (congestive heart failure) (HCC)    EF 40-45% by echo 12/06/2012  . Chronic diastolic heart failure (HCC)     Primarily diastolic CHF: Likely due to uncontrolled HTN. Last echo (8/12) with EF 45-50%, mild to moderate LVH with some asymmetric septal hypertrophy, RV normal size and systolic function. EF 50-55% by LV-gram in 6/12.   . Chronic lower back pain    secondary to DJD, obsetiy, hip problems. Followed by Dr. Ivory Broad (pain management)  . Coronary artery disease    questionable. LHC 05/2011 showing normal coronaries // Followed at [facility], Dr. Shirlee Latch  . Degeneration of lumbar or lumbosacral intervertebral disc   . DJD (degenerative joint disease) of hip    right sided  . Frequent UTI   . GERD (gastroesophageal reflux disease)   . HLD (hyperlipidemia)   . Hypertension    Poorly controlled. Has had HTN since age 89. Angioedema with ACEI.  24 Hr urine and renal arterial dopplers ordered . . . Never done  . LBBB (left bundle branch block)   . Liver disease   . Morbid obesity (HCC)   . NICM  (nonischemic cardiomyopathy) (HCC)    EF 45-50% in 8/12, cath 6/12 showed normal coronaries, EF 50-55% by LV gram  . OSA on CPAP    sleep study in 8/12 showed moderate to severe OSA requiring CPAP  . Polyneuropathy in diabetes(357.2)   . Presence of permanent cardiac pacemaker   . Seizures (HCC)    last 3 months  . Shortness of breath    none now  . Stroke [facility]) 12/2013   "my left side is paralyzed" (07/04/2014)  . Thoracic or lumbosacral neuritis or radiculitis, unspecified   . Type II diabetes mellitus (HCC) DX: 2002     Review of Systems: A complete ROS was negative except as noted in HPI.   Physical Exam: Vitals:   07/14/16 1008  BP: (!) 177/109  Pulse: 98  Temp: 97.8 F (36.6 C)  TempSrc: Oral  SpO2: 100%  Weight: 260 lb 11.2 oz (118.3 kg)  Height: 5\' 9"  (1.753 m)   General: Vital signs reviewed.  Patient is obese, in no acute distress and cooperative with exam.  Neck: 2 small pustular lesions on posterior neck without surrounding erythema.  Cardiovascular: RRR, S1 normal, S2 normal Pulmonary/Chest: Clear to auscultation bilaterally, no wheezes, rales, or rhonchi. Abdominal: Soft, non-tender, non-distended, BS + Musculoskeletal: LUE hemiplegia Extremities: Trace lower extremity edema bilaterally, wound on on left foot well healed.  Skin: Diffuse small  lesions with crusted scabs on right lower flank extending over buttocks and round right side to abdomen. Small patches of similar lesions on left abdomen, left leg, and posterior neck. No lesions in webs of fingers, face, groin. Excoriations presents. Lesions are not vesicular, some pustular and papular. No evidence of intertrigo. No surrounding erythema.  Psychiatric: Normal mood and affect. speech and behavior is normal. Cognition and memory are normal.   Assessment & Plan:  See encounters tab for problem based medical decision making. Patient seen with Dr. Heide Spark

## 2016-07-16 ENCOUNTER — Other Ambulatory Visit: Payer: Self-pay | Admitting: Cardiology

## 2016-07-16 ENCOUNTER — Other Ambulatory Visit: Payer: Self-pay | Admitting: Oncology

## 2016-07-16 ENCOUNTER — Other Ambulatory Visit: Payer: Self-pay | Admitting: Internal Medicine

## 2016-07-16 ENCOUNTER — Other Ambulatory Visit (HOSPITAL_COMMUNITY): Payer: Self-pay | Admitting: Internal Medicine

## 2016-07-16 ENCOUNTER — Telehealth: Payer: Self-pay

## 2016-07-16 DIAGNOSIS — IMO0002 Reserved for concepts with insufficient information to code with codable children: Secondary | ICD-10-CM

## 2016-07-16 DIAGNOSIS — E1140 Type 2 diabetes mellitus with diabetic neuropathy, unspecified: Secondary | ICD-10-CM

## 2016-07-16 DIAGNOSIS — I5042 Chronic combined systolic (congestive) and diastolic (congestive) heart failure: Secondary | ICD-10-CM

## 2016-07-16 DIAGNOSIS — E1165 Type 2 diabetes mellitus with hyperglycemia: Principal | ICD-10-CM

## 2016-07-16 DIAGNOSIS — I10 Essential (primary) hypertension: Secondary | ICD-10-CM

## 2016-07-16 NOTE — Telephone Encounter (Signed)
[name] from [facility] needs to speak with a nurse regarding mupirocin cream.

## 2016-07-19 ENCOUNTER — Telehealth: Payer: Self-pay | Admitting: Internal Medicine

## 2016-07-19 NOTE — Telephone Encounter (Signed)
Pt states a medication for bacterial is not covered on insurance needs something else

## 2016-07-20 ENCOUNTER — Emergency Department (HOSPITAL_COMMUNITY)
Admission: EM | Admit: 2016-07-20 | Discharge: 2016-07-20 | Disposition: A | Discharge status: Home / Self Care | Payer: Medicaid Other | Attending: Emergency Medicine | Admitting: Emergency Medicine

## 2016-07-20 ENCOUNTER — Other Ambulatory Visit: Payer: Self-pay

## 2016-07-20 ENCOUNTER — Emergency Department (HOSPITAL_COMMUNITY): Payer: Medicaid Other

## 2016-07-20 ENCOUNTER — Encounter (HOSPITAL_COMMUNITY): Payer: Self-pay | Admitting: Emergency Medicine

## 2016-07-20 DIAGNOSIS — Z8673 Personal history of transient ischemic attack (TIA), and cerebral infarction without residual deficits: Secondary | ICD-10-CM | POA: Diagnosis not present

## 2016-07-20 DIAGNOSIS — Z7984 Long term (current) use of oral hypoglycemic drugs: Secondary | ICD-10-CM | POA: Diagnosis not present

## 2016-07-20 DIAGNOSIS — Z7982 Long term (current) use of aspirin: Secondary | ICD-10-CM | POA: Insufficient documentation

## 2016-07-20 DIAGNOSIS — Z794 Long term (current) use of insulin: Secondary | ICD-10-CM | POA: Insufficient documentation

## 2016-07-20 DIAGNOSIS — Z96611 Presence of right artificial shoulder joint: Secondary | ICD-10-CM | POA: Insufficient documentation

## 2016-07-20 DIAGNOSIS — Z950 Presence of cardiac pacemaker: Secondary | ICD-10-CM | POA: Insufficient documentation

## 2016-07-20 DIAGNOSIS — R079 Chest pain, unspecified: Secondary | ICD-10-CM

## 2016-07-20 DIAGNOSIS — I5042 Chronic combined systolic (congestive) and diastolic (congestive) heart failure: Secondary | ICD-10-CM | POA: Diagnosis not present

## 2016-07-20 DIAGNOSIS — E1140 Type 2 diabetes mellitus with diabetic neuropathy, unspecified: Secondary | ICD-10-CM | POA: Insufficient documentation

## 2016-07-20 DIAGNOSIS — I110 Hypertensive heart disease with heart failure: Secondary | ICD-10-CM | POA: Insufficient documentation

## 2016-07-20 DIAGNOSIS — R0789 Other chest pain: Secondary | ICD-10-CM | POA: Diagnosis present

## 2016-07-20 DIAGNOSIS — R110 Nausea: Secondary | ICD-10-CM | POA: Diagnosis not present

## 2016-07-20 DIAGNOSIS — E1142 Type 2 diabetes mellitus with diabetic polyneuropathy: Secondary | ICD-10-CM | POA: Diagnosis not present

## 2016-07-20 DIAGNOSIS — J45909 Unspecified asthma, uncomplicated: Secondary | ICD-10-CM | POA: Diagnosis not present

## 2016-07-20 DIAGNOSIS — I2510 Atherosclerotic heart disease of native coronary artery without angina pectoris: Secondary | ICD-10-CM | POA: Diagnosis not present

## 2016-07-20 LAB — BASIC METABOLIC PANEL
Anion gap: 8 (ref 5–15)
BUN: 6 mg/dL (ref 6–20)
CHLORIDE: 102 mmol/L (ref 101–111)
CO2: 24 mmol/L (ref 22–32)
Calcium: 9 mg/dL (ref 8.9–10.3)
Creatinine, Ser: 0.68 mg/dL (ref 0.44–1.00)
Glucose, Bld: 165 mg/dL — ABNORMAL HIGH (ref 65–99)
POTASSIUM: 4.2 mmol/L (ref 3.5–5.1)
SODIUM: 134 mmol/L — AB (ref 135–145)

## 2016-07-20 LAB — CBC
HEMATOCRIT: 35.2 % — AB (ref 36.0–46.0)
Hemoglobin: 11.2 g/dL — ABNORMAL LOW (ref 12.0–15.0)
MCH: 27.1 pg (ref 26.0–34.0)
MCHC: 31.8 g/dL (ref 30.0–36.0)
MCV: 85 fL (ref 78.0–100.0)
PLATELETS: 222 10*3/uL (ref 150–400)
RBC: 4.14 MIL/uL (ref 3.87–5.11)
RDW: 13.6 % (ref 11.5–15.5)
WBC: 14 10*3/uL — AB (ref 4.0–10.5)

## 2016-07-20 LAB — I-STAT TROPONIN, ED: Troponin i, poc: 0 ng/mL (ref 0.00–0.08)

## 2016-07-20 MED ORDER — ONDANSETRON 8 MG PO TBDP: 8.0000 mg | ORAL_TABLET | Freq: Three times a day (TID) | ORAL | 0 refills | Status: DC | PRN

## 2016-07-20 MED ORDER — ONDANSETRON HCL 4 MG/2ML IJ SOLN
4.0000 mg | Freq: Once | INTRAMUSCULAR | Status: AC
Start: 2016-07-20 — End: 2016-07-20
  Administered 2016-07-20: 4 mg via INTRAVENOUS
  Filled 2016-07-20: qty 2

## 2016-07-20 MED ORDER — OXYCODONE-ACETAMINOPHEN 5-325 MG PO TABS: 2.0000 | ORAL_TABLET | Freq: Once | ORAL | Status: DC

## 2016-07-20 NOTE — ED Triage Notes (Signed)
Pt reports left sided chest pain that started this morning around 4am.  She has a hx of cardiac issues and is paced on the EKG.  She reports feeling "feeling bad" for a couple of days, including n/v but did not nauseas for for the past 24 hours.  Hx of stroke resulting in Left sided deficiens.  Pt was given 324 ASA in route.

## 2016-07-20 NOTE — Progress Notes (Deleted)
CC: ***  HPI:  [name] is a 50 y.o. woman with non-ischemic cardiomyopathy (w/ ICD, EF 25-30%), CVA (2015, w/ residual L hemiparesis, seizures), DM2, HTN, and HL.  She presented to the ED on 8/8 with chest pain which was self-limited and deemed to be non-cardiac.  She was last seen in clinic on 8/2 for a pruritic pustulopapular rash on her right flank and referred to dermatology.  At her last routine visit on 6/16 no changes were made to her maintenance meds.  Past Medical History:  Diagnosis Date  . Allergic rhinitis   . Arthritis    "hips, back, legs, arms" (07/04/2014)  . Asthma    hx  . Automatic implantable cardioverter-defibrillator in situ   . Calcifying tendinitis of shoulder   . Chronic combined systolic and diastolic CHF (congestive heart failure) (HCC)    EF 40-45% by echo 12/06/2012  . Chronic diastolic heart failure (HCC)     Primarily diastolic CHF: Likely due to uncontrolled HTN. Last echo (8/12) with EF 45-50%, mild to moderate LVH with some asymmetric septal hypertrophy, RV normal size and systolic function. EF 50-55% by LV-gram in 6/12.   . Chronic lower back pain    secondary to DJD, obsetiy, hip problems. Followed by Dr. Ivory Broad (pain management)  . Coronary artery disease    questionable. LHC 05/2011 showing normal coronaries // Followed at [facility], Dr. Shirlee Latch  . Degeneration of lumbar or lumbosacral intervertebral disc   . DJD (degenerative joint disease) of hip    right sided  . Frequent UTI   . GERD (gastroesophageal reflux disease)   . HLD (hyperlipidemia)   . Hypertension    Poorly controlled. Has had HTN since age 44. Angioedema with ACEI.  24 Hr urine and renal arterial dopplers ordered . . . Never done  . LBBB (left bundle branch block)   . Liver disease   . Morbid obesity (HCC)   . NICM (nonischemic cardiomyopathy) (HCC)    EF 45-50% in 8/12, cath 6/12 showed normal coronaries, EF 50-55% by LV gram  . OSA on CPAP      sleep study in 8/12 showed moderate to severe OSA requiring CPAP  . Polyneuropathy in diabetes(357.2)   . Presence of permanent cardiac pacemaker   . Seizures (HCC)    last 3 months  . Shortness of breath    none now  . Stroke [facility]) 12/2013   "my left side is paralyzed" (07/04/2014)  . Thoracic or lumbosacral neuritis or radiculitis, unspecified   . Type II diabetes mellitus (HCC) DX: 2002    Review of Systems:  ***  Physical Exam:  There were no vitals filed for this visit. ***  CBC Latest Ref Rng & Units 07/20/2016 12/23/2015 06/13/2015  WBC 4.0 - 10.5 K/uL 14.0(H) 9.5 9.9  Hemoglobin 12.0 - 15.0 g/dL 11.2(L) 12.3 12.0  Hematocrit 36.0 - 46.0 % 35.2(L) 38.0 37.1  Platelets 150 - 400 K/uL 222 213 253   BMP Latest Ref Rng & Units 07/20/2016 05/28/2016 04/29/2016  Glucose 65 - 99 mg/dL 937(T) 024(O) 973(Z)  BUN 6 - 20 mg/dL 6 9 7   Creatinine 0.44 - 1.00 mg/dL 3.29 9.24  BUN/Creat Ratio 9 - 23 - 13 10  Sodium 135 - 145 mmol/L 134(L) 138 138  Potassium 3.5 - 5.1 mmol/L 4.2 4.1 4.0  Chloride 101 - 111 mmol/L 102 97 99  CO2 22 - 32 mmol/L 24 22 18   Calcium 8.9 - 10.3 mg/dL  9.0 9.7 9.2    Lab Results  Component Value Date   HGBA1C 8.1 04/29/2016   HGBA1C 8.6 (H) 12/23/2015   HGBA1C 7.0 09/09/2015    Assessment & Plan:   See Encounters Tab for problem based charting.  Patient {GC/GE:[id number]::"discussed with","seen with"} Dr. {NAMES:[id number]::"Butcher","Granfortuna","E. Hoffman","Klima","Mullen","Narendra","Vincent"}

## 2016-07-20 NOTE — Telephone Encounter (Signed)
Yes I agree. Thank you 

## 2016-07-20 NOTE — ED Provider Notes (Signed)
[facility]-EMERGENCY DEPT Provider Note   CSN: [id number] Arrival date & time: 07/20/16  8413  First Provider Contact:  First MD Initiated Contact with Patient 07/20/16 [phone]        History   Chief Complaint Chief Complaint  Patient presents with  . Chest Pain    HPI [name] is a 50 y.o. female.  HPI Patient presents emergency department with complaints of chest discomfort which began last night.  Originally her discomfort and pain began before she went to bed and she felt it as a discomfort in her anterior chest without radiation.  Her discomfort continued and she eventually fell off to sleep.  When she awoke early in the morning she was still having discomfort in her chest as well as a new pain in her left foot.  She has recurrent pain in her left foot from a previous sewing needle that his been stuck in her left foot which she is following with her orthopedic surgeon.  This occurred sometime ago and is not a new issue.  At time she reports the pain in her left foot flares as she is now regaining sensation in her left foot from her prior stroke.  At time of my evaluation the patient reports that her chest discomfort is completely resolved and her pain in her foot is resolved as well.  She has no new shortness of breath.  She denies orthopnea.  She has a history of nonischemic cardiomyopathy with normal catheter in 2012.  She follows with a heart failure clinic.  She's had no recent change in her medications and reports medication compliance.  She has to milligram Percocet at home but did not take 1 prior to coming and she reports that lately she's been using her pain medication more than she would like and she is trying to reduce her use.    Past Medical History:  Diagnosis Date  . Allergic rhinitis   . Arthritis    "hips, back, legs, arms" (07/04/2014)  . Asthma    hx  . Automatic implantable cardioverter-defibrillator in situ   . Calcifying tendinitis of shoulder   . Chronic  combined systolic and diastolic CHF (congestive heart failure) (HCC)    EF 40-45% by echo 12/06/2012  . Chronic diastolic heart failure (HCC)     Primarily diastolic CHF: Likely due to uncontrolled HTN. Last echo (8/12) with EF 45-50%, mild to moderate LVH with some asymmetric septal hypertrophy, RV normal size and systolic function. EF 50-55% by LV-gram in 6/12.   . Chronic lower back pain    secondary to DJD, obsetiy, hip problems. Followed by Dr. Ivory Broad (pain management)  . Coronary artery disease    questionable. LHC 05/2011 showing normal coronaries // Followed at [facility], Dr. Shirlee Latch  . Degeneration of lumbar or lumbosacral intervertebral disc   . DJD (degenerative joint disease) of hip    right sided  . Frequent UTI   . GERD (gastroesophageal reflux disease)   . HLD (hyperlipidemia)   . Hypertension    Poorly controlled. Has had HTN since age 28. Angioedema with ACEI.  24 Hr urine and renal arterial dopplers ordered . . . Never done  . LBBB (left bundle branch block)   . Liver disease   . Morbid obesity (HCC)   . NICM (nonischemic cardiomyopathy) (HCC)    EF 45-50% in 8/12, cath 6/12 showed normal coronaries, EF 50-55% by LV gram  . OSA on CPAP    sleep study in  8/12 showed moderate to severe OSA requiring CPAP  . Polyneuropathy in diabetes(357.2)   . Presence of permanent cardiac pacemaker   . Seizures (HCC)    last 3 months  . Shortness of breath    none now  . Stroke [facility]) 12/2013   "my left side is paralyzed" (07/04/2014)  . Thoracic or lumbosacral neuritis or radiculitis, unspecified   . Type II diabetes mellitus (HCC) DX: 2002    Patient Active Problem List   Diagnosis Date Noted  . Papular eruption 07/14/2016  . Type 2 diabetes mellitus, uncontrolled, with neuropathy (HCC) 02/04/2016  . Contact dermatitis 02/03/2016  . Preoperative cardiovascular examination 12/05/2015  . Left spastic hemiparesis (HCC) 09/23/2015  . Constipation 09/09/2015  . HTN,  goal below 130/80 07/15/2015  . Right shoulder pain 06/30/2015  . Localization-related symptomatic epilepsy and epileptic syndromes with complex partial seizures, not intractable, without status epilepticus (HCC) 06/13/2015  . Hemiparesis affecting left side as late effect of cerebrovascular accident (HCC) 06/13/2015  . Morbid obesity (HCC) 06/13/2015  . Ulcer of left foot (HCC) 06/11/2015  . Candidal intertrigo 11/05/2014  . History of seizure 10/14/2014  . Chronic diastolic heart failure (HCC)   . Vitamin D deficiency 10/01/2014  . Tachycardia 07/04/2014  . Generalized weakness 07/04/2014  . Perimenopausal menorrhagia 12/20/2013  . Stroke (HCC) 12/13/2013  . Biventricular implantable cardiac defibrillator -[employer] 11/23/2013  . Mixed stress and urge urinary incontinence 10/11/2013  . Preventative health care 09/13/2013  . Chronic combined systolic and diastolic heart failure (HCC) 08/01/2013  . Nonischemic cardiomyopathy (HCC) 08/01/2013  . Goiter 08/01/2013  . LBBB (left bundle branch block) 07/19/2013  . Depression 04/09/2013  . Primary osteoarthritis of left knee 04/14/2012  . Lumbar spondylosis 04/14/2012  . Diabetic peripheral neuropathy (HCC) 04/14/2012  . Hyperlipidemia 11/23/2011  . OSA (obstructive sleep apnea) 08/27/2011  . Benign essential HTN 08/27/2010    Past Surgical History:  Procedure Laterality Date  . BI-VENTRICULAR IMPLANTABLE CARDIOVERTER DEFIBRILLATOR N/A 08/22/2013   Procedure: BI-VENTRICULAR IMPLANTABLE CARDIOVERTER DEFIBRILLATOR  (CRT-D);  Surgeon: [name], MD;  Location: [facility] CATH LAB;  Service: Cardiovascular;  Laterality: N/A;  . BI-VENTRICULAR IMPLANTABLE CARDIOVERTER DEFIBRILLATOR  (CRT-D)  08/2013   [name] 08/23/2013  . BREAST SURGERY Bilateral 2011   patient reports benign results  . CARDIAC CATHETERIZATION  05/2011  . CARPAL TUNNEL RELEASE Left    denies  . HEMIARTHROPLASTY SHOULDER FRACTURE Right 1980's   denies  . MULTIPLE TOOTH  EXTRACTIONS  ~ 2011   tumors removed ; "my whole top"  . SHOULDER ARTHROSCOPY Right 12/26/2015   Procedure: Right Shoulder Arthroscopy, Debridement, and Decompression;  Surgeon: [name], MD;  Location[facility] OR;  Service: Orthopedics;  Laterality: Right;  . TEE WITHOUT CARDIOVERSION N/A 01/14/2014   Procedure: TRANSESOPHAGEAL ECHOCARDIOGRAM (TEE);  Surgeon: Thurmon Fair, MD;  Location: [facility] ENDOSCOPY;  Service: Cardiovascular;  Laterality: N/A;  . TUBAL LIGATION  05/31/1985    OB History    No data available       Home Medications    Prior to Admission medications   Medication Sig Start Date End Date Taking? Authorizing Provider  ACCU-CHEK FASTCLIX LANCETS MISC Use 2 to 3 times daily to check blood sugar. diag code E11.40. Insulin dependent 06/08/16   Burns Spain, MD  amLODipine (NORVASC) 10 MG tablet TAKE 1 TABLET BY MOUTH EVERY DAY 07/19/16   Dolores Patty, MD  aspirin 81 MG tablet Take 1 tablet (81 mg total) by mouth daily. 12/29/15  Laurey Morale, MD  atorvastatin (LIPITOR) 40 MG tablet Take 1 tablet (40 mg total) by mouth daily. 09/26/15   Laurey Morale, MD  calamine lotion Apply 1 application topically as needed for itching. 07/14/16   Servando Snare, MD  citalopram (CELEXA) 20 MG tablet TAKE 1 TABLET BY MOUTH EVERY DAY 04/13/16   Courtney Paris, MD  cyclobenzaprine (FLEXERIL) 10 MG tablet TAKE ONE TABLET BY MOUTH 3 TIMES A DAY IF NEEDED FOR MUSCLE SPASMS 06/28/16   Ranelle Oyster, MD  diclofenac sodium (VOLTAREN) 1 % GEL Apply 2 g topically 4 (four) times daily as needed (pain). 03/03/16   Ranelle Oyster, MD  digoxin (LANOXIN) 0.125 MG tablet Take 1 tablet (0.125 mg total) by mouth daily. 05/17/16   Amy D Filbert Schilder, NP  docusate sodium (COLACE) 50 MG capsule Take 1 capsule (50 mg total) by mouth 2 (two) times daily as needed for mild constipation. 09/09/15   Courtney Paris, MD  fluticasone (CUTIVATE) 0.05 % cream Apply topically 2 (two) times daily. Dispense two tubes Patient  taking differently: Apply 1 application topically 2 (two) times daily.  02/25/15   Courtney Paris, MD  furosemide (LASIX) 40 MG tablet Take 1 tablet (40 mg total) by mouth 2 (two) times a week. 01/13/16   Amy D Clegg, NP  glucose blood (ACCU-CHEK SMARTVIEW) test strip Use to Check Blood Sugar TID. Code: E11.40. 10/16/15   Courtney Paris, MD  hydrALAZINE (APRESOLINE) 50 MG tablet TAKE ONE TABLET BY MOUTH 3 TIMES A DAY 07/19/16   Levert Feinstein, MD  Insulin Glargine (LANTUS SOLOSTAR) 100 UNIT/ML Solostar Pen Inject 45 Units into the skin daily at 10 pm. Inject 45 units in the PM. 02/03/16   Courtney Paris, MD  Insulin Pen Needle (GLOBAL EASE INJECT PEN NEEDLES) 31G X 5 MM MISC Use to inject insulin BID. Code: E 11.40 10/16/15   Courtney Paris, MD  INVOKANA 100 MG TABS tablet TAKE ONE TABLET BY MOUTH EVERY DAY BEFORE BREAKFAST 07/19/16   Levert Feinstein, MD  ipratropium (ATROVENT) 0.06 % nasal spray Place 2 sprays into both nostrils 4 (four) times daily. 01/07/15   Linna Hoff, MD  isosorbide mononitrate (IMDUR) 60 MG 24 hr tablet TAKE 1 & 1/2 TABLETS BY MOUTH EVERY DAY 07/19/16   Laurey Morale, MD  levETIRAcetam (KEPPRA) 500 MG tablet TAKE 1 TABLET BY MOUTH TWO TIMES DAILY **MUST NOT MISS ANY DOSE** 07/19/16   Levert Feinstein, MD  metFORMIN (GLUCOPHAGE) 1000 MG tablet TAKE 1 TABLET BY MOUTH TWO TIMES DAILY WITH A MEAL 05/18/16   Courtney Paris, MD  metoprolol succinate (TOPROL-XL) 100 MG 24 hr tablet TAKE 1 TABLET BY MOUTH TWO TIMES DAILY 07/19/16   Laurey Morale, MD  mupirocin cream (BACTROBAN) 2 % Apply 1 application topically 2 (two) times daily. 07/14/16   Alexa Lucrezia Starch, MD  nystatin cream (MYCOSTATIN) APPLY UNDER ABDOMINAL SKIN AND ON BUTTOCKS 2 TIMES A DAY UNTIL RASH RESOLVES 04/13/16   Courtney Paris, MD  oxyCODONE-acetaminophen (PERCOCET) 10-325 MG tablet Take 1 tablet by mouth every 8 (eight) hours as needed for pain. 06/28/16   Ranelle Oyster, MD  pantoprazole (PROTONIX) 40 MG tablet Take 40 mg by mouth  daily.    Historical Provider, MD  polyethylene glycol (MIRALAX / GLYCOLAX) packet MIX 1 PACKET IN 8OZ OF LIQUID EVERY DAY 06/17/16   Levert Feinstein, MD  solifenacin (VESICARE) 10 MG tablet  Take 10 mg by mouth daily.    Historical Provider, MD  spironolactone (ALDACTONE) 50 MG tablet TAKE ONE TABLET BY MOUTH EVERY DAY 07/19/16   Levert Feinstein, MD  triamcinolone (KENALOG) 0.025 % ointment APPLY TOPICALLY TO LEFT ARM 2 TIMES A DAY UNTIL RASH AND ITCHING RESOLVES 07/19/16   Levert Feinstein, MD  Vitamin D, Ergocalciferol, (DRISDOL) 50000 units CAPS capsule TAKE 1 CAPSULE BY MOUTH EVERY MONDAY 06/02/16   Courtney Paris, MD    Family History Family History  Problem Relation Age of Onset  . Heart disease Mother 64    Died of MI at age 24 yo  . Kidney disease Mother     requiring dialysis  . Congestive Heart Failure Mother   . Heart disease Father 25    MI age 72yo requiring stenting  . Diabetes Father   . Glaucoma Father   . Heart disease Paternal Grandmother     requiring pacemaker.  [name] Heart disease Paternal Grandfather 37    Died of MI at possibly age 39-53yo  . Stroke Paternal Grandfather   . Diabetes Brother   . Heart disease Brother 56    MI at age 37 years old  . Breast cancer Paternal Aunt   . Breast cancer Maternal Grandmother     Social History Social History  Substance Use Topics  . Smoking status: Never Smoker  . Smokeless tobacco: Never Used  . Alcohol use No     Allergies   Ace inhibitors; Clindamycin; Enalapril maleate; and Lisinopril   Review of Systems Review of Systems  All other systems reviewed and are negative.    Physical Exam Updated Vital Signs BP 146/91   Pulse 90   Temp 98.4 F (36.9 C) (Oral)   Resp 12   Ht 5\' 9"  (1.753 m)   Wt 259 lb (117.5 kg)   SpO2 97%   BMI 38.25 kg/m   Physical Exam  Constitutional: She is oriented to person, place, and time. She appears well-developed and well-nourished. No distress.  HENT:  Head:  Normocephalic and atraumatic.  Eyes: EOM are normal.  Neck: Normal range of motion.  Cardiovascular: Normal rate, regular rhythm and normal heart sounds.   Pulmonary/Chest: Effort normal and breath sounds normal.  Abdominal: Soft. She exhibits no distension. There is no tenderness.  Musculoskeletal: Normal range of motion.  Normal pulses left foot.  No signs of infection of the left foot.  Neurological: She is alert and oriented to person, place, and time.  Skin: Skin is warm and dry.  Psychiatric: She has a normal mood and affect. Judgment normal.  Nursing note and vitals reviewed.    ED Treatments / Results  Labs (all labs ordered are listed, but only abnormal results are displayed) Labs Reviewed  BASIC METABOLIC PANEL - Abnormal; Notable for the following:       Result Value   Sodium 134 (*)    Glucose, Bld 165 (*)    All other components within normal limits  CBC - Abnormal; Notable for the following:    WBC 14.0 (*)    Hemoglobin 11.2 (*)    HCT 35.2 (*)    All other components within normal limits  I-STAT TROPOININ, ED    EKG  EKG Interpretation  Date/Time:  Tuesday July 20 2016 06:30:30 EDT Ventricular Rate:  91 PR Interval:    QRS Duration: 136 QT Interval:  404 QTC Calculation: 498 R Axis:   157 Text Interpretation:  Sinus rhythm  No significant change was found Confirmed by Sung Renton  MD, Caryn Bee [phone]) on 07/20/2016 7:17:14 AM       Radiology Dg Chest 2 View  Result Date: 07/20/2016 CLINICAL DATA:  Chest pain and cough with shortness of breath for 3 days, LEFT arm weakness, history stroke, hypertension, non ischemic cardiomyopathy, type II diabetes mellitus, asthma, coronary artery disease, CHF EXAM: CHEST  2 VIEW COMPARISON:  06/13/2015 FINDINGS: LEFT subclavian AICD leads project at RIGHT atrium, RIGHT ventricle, and coronary sinus. Enlargement of cardiac silhouette. Mediastinal contours and pulmonary vascularity normal. Mild tortuosity of thoracic aorta.  Lungs clear. No pleural effusion or pneumothorax. IMPRESSION: Enlargement of cardiac silhouette post AICD. No acute abnormalities. Electronically Signed   By: Ulyses Southward M.D.   On: 07/20/2016 07:24    Procedures Procedures (including critical care time)  Medications Ordered in ED Medications  ondansetron (ZOFRAN) injection 4 mg (not administered)     Initial Impression / Assessment and Plan / ED Course  I have reviewed the triage vital signs and the nursing notes.  Pertinent labs & imaging results that were available during my care of the patient were reviewed by me and considered in my medical decision making (see chart for details).  Clinical Course    Overall well-appearing.  Doubt ACS.  Chest x-ray without acute changes.  EKG is nonischemic.  Troponin is negative.  Outpatient follow-up with her cardiologist.  She will contact her orthopedic surgeon for additional recommendations.  She has pain medication at home.  She understands return to the ER for new or worsening symptoms  Final Clinical Impressions(s) / ED Diagnoses   Final diagnoses:  None    New Prescriptions New Prescriptions     zofran   Azalia Bilis, MD 07/20/16 [phone]

## 2016-07-20 NOTE — Telephone Encounter (Signed)
Pharmacy wanted VO to change mupirocin cream to ointment due to insurance, VO given, do you agree

## 2016-07-23 NOTE — Progress Notes (Signed)
Internal Medicine Clinic Attending  I saw and evaluated the patient.  I personally confirmed the key portions of the history and exam documented by Dr. Burns and I reviewed pertinent patient test results.  The assessment, diagnosis, and plan were formulated together and I agree with the documentation in the resident's note. 

## 2016-07-26 ENCOUNTER — Encounter: Payer: Medicaid Other | Admitting: Internal Medicine

## 2016-07-26 ENCOUNTER — Telehealth: Payer: Self-pay | Admitting: Student-PharmD

## 2016-07-26 NOTE — Assessment & Plan Note (Deleted)
BP Readings from Last 3 Encounters:  07/20/16 149/98  07/14/16 (!) 177/109  06/28/16 (!) 160/113   Assessment BP control: *** BP goal: ***  Plan Medications: *** Other: ***

## 2016-07-26 NOTE — Telephone Encounter (Deleted)
Amlodipine 10 mg tablet,  Atorvastatin 40 mg tablet Citalopram 20 mg tablet Digoxin 0.125 mg Tablet Furosemide 40 mg Tablet Hydralazine 50 mg Tablet Lantus 100 unit/ml Pen Invokana 100 mg Tablet Imdur 60 mg 24 hr tablet Keppra 500 mg Tablet Metformin 1000 mg Tablet Metoprolol Succinate 100 mg 24 hour tablet Spironolactone 50 mg Tablet

## 2016-07-26 NOTE — Telephone Encounter (Signed)
Amlodipine 10 mg tablet, per [facility] on hold since 07/15/15 last filled, quantity 30  Per [facility] last filled 7/21, q 30 Atorvastatin 40 mg tablet, last filled 07/02/16, quantity 30 Citalopram 20 mg tablet, last filled 06/08/16, q 90 Digoxin 0.125 mg Tablet, last filled 06/28/15 per [facility] Furosemide 40 mg Tablet, Per [facility] on hold since 07/15/15 but never picked up  Per [facility] last filled 07/02/16, q 35 Hydralazine 50 mg Tablet, last filled 07/02/16, q 30 Lantus 100 unit/ml Pen, last filled 07/02/16, q 34 Invokana 100 mg Tablet, last filled 07/02/16, q 30 Imdur 60 mg 24 hr tablet, last filled 07/02/16, q 30 Keppra 500 mg Tablet, last filled 07/02/16, q 30 Metformin 1000 mg Tablet, last filled 04/29/16, q 90 Metoprolol Succinate 100 mg 24 hour tablet, On hold since 06/22/15 but never picked up  Per [facility] last filled 07/02/16, q 30 Spironolactone 50 mg Tablet, last filled 07/02/16, q 30

## 2016-07-28 ENCOUNTER — Encounter: Payer: Medicaid Other | Attending: Physical Medicine & Rehabilitation | Admitting: Physical Medicine & Rehabilitation

## 2016-07-28 ENCOUNTER — Encounter: Payer: Self-pay | Admitting: Physical Medicine & Rehabilitation

## 2016-07-28 DIAGNOSIS — G8110 Spastic hemiplegia affecting unspecified side: Secondary | ICD-10-CM | POA: Insufficient documentation

## 2016-07-28 DIAGNOSIS — I5042 Chronic combined systolic (congestive) and diastolic (congestive) heart failure: Secondary | ICD-10-CM | POA: Insufficient documentation

## 2016-07-28 DIAGNOSIS — E1342 Other specified diabetes mellitus with diabetic polyneuropathy: Secondary | ICD-10-CM | POA: Insufficient documentation

## 2016-07-28 DIAGNOSIS — Z5181 Encounter for therapeutic drug level monitoring: Secondary | ICD-10-CM | POA: Insufficient documentation

## 2016-07-28 DIAGNOSIS — E1142 Type 2 diabetes mellitus with diabetic polyneuropathy: Secondary | ICD-10-CM

## 2016-07-28 DIAGNOSIS — I63319 Cerebral infarction due to thrombosis of unspecified middle cerebral artery: Secondary | ICD-10-CM | POA: Diagnosis present

## 2016-07-28 DIAGNOSIS — M47817 Spondylosis without myelopathy or radiculopathy, lumbosacral region: Secondary | ICD-10-CM | POA: Diagnosis present

## 2016-07-28 DIAGNOSIS — I63311 Cerebral infarction due to thrombosis of right middle cerebral artery: Secondary | ICD-10-CM

## 2016-07-28 DIAGNOSIS — G629 Polyneuropathy, unspecified: Secondary | ICD-10-CM | POA: Insufficient documentation

## 2016-07-28 DIAGNOSIS — Z79899 Other long term (current) drug therapy: Secondary | ICD-10-CM | POA: Diagnosis present

## 2016-07-28 DIAGNOSIS — G8114 Spastic hemiplegia affecting left nondominant side: Secondary | ICD-10-CM | POA: Diagnosis not present

## 2016-07-28 MED ORDER — OXYCODONE-ACETAMINOPHEN 10-325 MG PO TABS
1.0000 | ORAL_TABLET | Freq: Three times a day (TID) | ORAL | 0 refills | Status: DC | PRN
Start: 2016-07-28 — End: 2016-08-24

## 2016-07-28 NOTE — Patient Instructions (Signed)
PLEASE CALL ME WITH ANY PROBLEMS OR QUESTIONS [phone])  BRING YOUR GABAPENTIN DOSE WITH YOU AT NEXT VISIT

## 2016-07-28 NOTE — Progress Notes (Signed)
Subjective:    Patient ID: [name], female    DOB: 1966-08-07, 50 y.o.   MRN: [id number]  HPI   [name] is here in follow up of her spastic left hemiparesis. She developed incresaed left foot pain and saw Dr. Lajoyce Corners who placed her on gabapentin (we had her on this previously). She continues on oxycodone per our office which she's had to take a bit more of due to the increased pain.   ON a positive note, she has lost further weight, improved her BP, and kept her sugars under much tighter control.     Pain Inventory Average Pain 6 Pain Right Now 6 My pain is burning, tingling and aching  In the last 24 hours, has pain interfered with the following? General activity 1 Relation with others 3 Enjoyment of life 4 What TIME of day is your pain at its worst? evening night Sleep (in general) Fair  Pain is worse with: walking, unsure and some activites Pain improves with: rest, heat/ice and medication Relief from Meds: 4  Mobility use a cane  Function I need assistance with the following:  dressing, bathing, toileting, meal prep, household duties and shopping  Neuro/Psych bladder control problems tingling trouble walking spasms loss of taste or smell  Prior Studies Any changes since last visit?  no  Physicians involved in your care Any changes since last visit?  no   Family History  Problem Relation Age of Onset  . Heart disease Mother 18    Died of MI at age 52 yo  . Kidney disease Mother     requiring dialysis  . Congestive Heart Failure Mother   . Heart disease Father 4    MI age 47yo requiring stenting  . Diabetes Father   . Glaucoma Father   . Heart disease Paternal Grandmother     requiring pacemaker.  [name] Heart disease Paternal Grandfather 84    Died of MI at possibly age 67-53yo  . Stroke Paternal Grandfather   . Diabetes Brother   . Heart disease Brother 18    MI at age 67 years old  . Breast cancer Paternal Aunt   . Breast cancer  Maternal Grandmother    Social History   Social History  . Marital status: Single    Spouse name: N/A  . Number of children: 2  . Years of education: 9th grade   Occupational History  . Unemployed     planning on getting disability   Social History Main Topics  . Smoking status: Never Smoker  . Smokeless tobacco: Never Used  . Alcohol use No  . Drug use: No  . Sexual activity: Not Asked   Other Topics Concern  . None   Social History Narrative   Lives in [place] with her son. Is able to read and write fluently in [place].   Past Surgical History:  Procedure Laterality Date  . BI-VENTRICULAR IMPLANTABLE CARDIOVERTER DEFIBRILLATOR N/A 08/22/2013   Procedure: BI-VENTRICULAR IMPLANTABLE CARDIOVERTER DEFIBRILLATOR  (CRT-D);  Surgeon: [name], MD;  Location: [facility] CATH LAB;  Service: Cardiovascular;  Laterality: N/A;  . BI-VENTRICULAR IMPLANTABLE CARDIOVERTER DEFIBRILLATOR  (CRT-D)  08/2013   [name] 08/23/2013  . BREAST SURGERY Bilateral 2011   patient reports benign results  . CARDIAC CATHETERIZATION  05/2011  . CARPAL TUNNEL RELEASE Left    denies  . HEMIARTHROPLASTY SHOULDER FRACTURE Right 1980's   denies  . MULTIPLE TOOTH EXTRACTIONS  ~ 2011   tumors removed ; "my  whole top"  . SHOULDER ARTHROSCOPY Right 12/26/2015   Procedure: Right Shoulder Arthroscopy, Debridement, and Decompression;  Surgeon: [name], MD;  Location: [facility] OR;  Service: Orthopedics;  Laterality: Right;  . TEE WITHOUT CARDIOVERSION N/A 01/14/2014   Procedure: TRANSESOPHAGEAL ECHOCARDIOGRAM (TEE);  Surgeon: Thurmon Fair, MD;  Location: [facility] ENDOSCOPY;  Service: Cardiovascular;  Laterality: N/A;  . TUBAL LIGATION  05/31/1985   Past Medical History:  Diagnosis Date  . Allergic rhinitis   . Arthritis    "hips, back, legs, arms" (07/04/2014)  . Asthma    hx  . Automatic implantable cardioverter-defibrillator in situ   . Calcifying tendinitis of shoulder   . Chronic combined systolic and diastolic  CHF (congestive heart failure) (HCC)    EF 40-45% by echo 12/06/2012  . Chronic diastolic heart failure (HCC)     Primarily diastolic CHF: Likely due to uncontrolled HTN. Last echo (8/12) with EF 45-50%, mild to moderate LVH with some asymmetric septal hypertrophy, RV normal size and systolic function. EF 50-55% by LV-gram in 6/12.   . Chronic lower back pain    secondary to DJD, obsetiy, hip problems. Followed by Dr. Ivory Broad (pain management)  . Coronary artery disease    questionable. LHC 05/2011 showing normal coronaries // Followed at [facility], Dr. Shirlee Latch  . Degeneration of lumbar or lumbosacral intervertebral disc   . DJD (degenerative joint disease) of hip    right sided  . Frequent UTI   . GERD (gastroesophageal reflux disease)   . HLD (hyperlipidemia)   . Hypertension    Poorly controlled. Has had HTN since age 43. Angioedema with ACEI.  24 Hr urine and renal arterial dopplers ordered . . . Never done  . LBBB (left bundle branch block)   . Liver disease   . Morbid obesity (HCC)   . NICM (nonischemic cardiomyopathy) (HCC)    EF 45-50% in 8/12, cath 6/12 showed normal coronaries, EF 50-55% by LV gram  . OSA on CPAP    sleep study in 8/12 showed moderate to severe OSA requiring CPAP  . Polyneuropathy in diabetes(357.2)   . Presence of permanent cardiac pacemaker   . Seizures (HCC)    last 3 months  . Shortness of breath    none now  . Stroke [facility]) 12/2013   "my left side is paralyzed" (07/04/2014)  . Thoracic or lumbosacral neuritis or radiculitis, unspecified   . Type II diabetes mellitus (HCC) DX: 2002   BP (!) 146/98 (BP Location: Left Arm, Patient Position: Sitting, Cuff Size: Large)   Pulse 95   Resp 16   SpO2 96%   Opioid Risk Score:   Fall Risk Score:  `1  Depression screen PHQ 2/9  Depression screen [facility] 2/9 07/28/2016 07/14/2016 04/29/2016 03/05/2016 02/03/2016 09/09/2015 09/02/2015  Decreased Interest 0 0 0 0 0 0 0  Down, Depressed, Hopeless 0 - 0 0 0 0  0  PHQ - 2 Score 0 0 0 0 0 0 0  Altered sleeping - - - - - - -  Tired, decreased energy - - - - - - -  Change in appetite - - - - - - -  Feeling bad or failure about yourself  - - - - - - -  Trouble concentrating - - - - - - -  Moving slowly or fidgety/restless - - - - - - -  Suicidal thoughts - - - - - - -  PHQ-9 Score - - - - - - -  Some recent data might be hidden    Review of Systems  Respiratory: Positive for cough.   All other systems reviewed and are negative.      Objective:   Physical Exam  Constitutional: She is oriented to person, place, and time. She appears well-developed and well-nourished.  HENT:  Head: Normocephalic and atraumatic.  Neck: Normal range of motion. Neck supple.  Cardiovascular: Normal rate and regular rhythm.  Pulmonary/Chest: Effort normal and breath sounds normal.  Musculoskeletal: edema 1+ LUE, non-pitting Normal Muscle Bulk and Muscle Testing Reveals: Upper Extremities: Right Decreased ROM 45 Degrees and Muscle Strength 4/5 Right AC Joint Tenderness Right knee with crepitus and mild effusion Left: Hemiparesis/ 0/5 Right Thoracic Paraspinal Tenderness: T-1- T-3 Lumbar Paraspinal Tenderness: L-3- L-5 Lower Extremities: Right: Full ROM and Muscle Strength 5/5 Left: Hemiparesis. Tone 2-3/4 left finger flexors/wrist/forearm pronators. Bicep/BR are 1+/4 Neurological: She is alert and oriented to person, place, and time.  Skin: Skin is warm and dry.  Psychiatric: She has a normal mood and affect.  Nursing note and vitals reviewed.        Assessment & Plan:  1. Chronic low back pain: Continue Current Medication Regime. Refilled: oxyCODONE 10/325mg  one tablet every 8 hours as needed.#90.   2. Diabetic peripheral neuropathy:   -gabapentin per Lajoyce Corners, we can rx in future 3. 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 Repeat injections left finger and wrist flexors next month -elevate LUE when in  chair or bed. Consider evening ACE wrap also.  4. OA: Continue Voltaren gel  5. CHF and HTN: -she has lost weight and bp and edema has improved! 6. Diabetes control per primary---improved!   20 minutes of face to face patient care time was spent during this visit. All questions were encouraged and answered.

## 2016-08-04 ENCOUNTER — Other Ambulatory Visit: Payer: Self-pay | Admitting: Oncology

## 2016-08-04 NOTE — Telephone Encounter (Signed)
Constipation though 2/2 opioids. Will refill  BP not controlled. PCP appt Oct but needs appt sooner to address uncontrolled HTN. Please sch ACC appt just for HTN and keep her OCt CC appt.

## 2016-08-04 NOTE — Telephone Encounter (Signed)
Message sent to front office to schedule appt.

## 2016-08-09 ENCOUNTER — Encounter (HOSPITAL_COMMUNITY): Payer: Self-pay

## 2016-08-09 ENCOUNTER — Ambulatory Visit (HOSPITAL_COMMUNITY)
Admission: RE | Admit: 2016-08-09 | Discharge: 2016-08-09 | Disposition: A | Discharge status: Home / Self Care | Payer: Medicaid Other | Source: Ambulatory Visit | Attending: Cardiology | Admitting: Cardiology

## 2016-08-09 VITALS — BP 176/110 | HR 112 | Wt 255.8 lb

## 2016-08-09 DIAGNOSIS — I447 Left bundle-branch block, unspecified: Secondary | ICD-10-CM

## 2016-08-09 DIAGNOSIS — Z7982 Long term (current) use of aspirin: Secondary | ICD-10-CM | POA: Diagnosis not present

## 2016-08-09 DIAGNOSIS — Z794 Long term (current) use of insulin: Secondary | ICD-10-CM | POA: Insufficient documentation

## 2016-08-09 DIAGNOSIS — Z79899 Other long term (current) drug therapy: Secondary | ICD-10-CM | POA: Insufficient documentation

## 2016-08-09 DIAGNOSIS — I428 Other cardiomyopathies: Secondary | ICD-10-CM | POA: Insufficient documentation

## 2016-08-09 DIAGNOSIS — Z95810 Presence of automatic (implantable) cardiac defibrillator: Secondary | ICD-10-CM | POA: Insufficient documentation

## 2016-08-09 DIAGNOSIS — I5032 Chronic diastolic (congestive) heart failure: Secondary | ICD-10-CM | POA: Diagnosis present

## 2016-08-09 DIAGNOSIS — I10 Essential (primary) hypertension: Secondary | ICD-10-CM

## 2016-08-09 DIAGNOSIS — G4733 Obstructive sleep apnea (adult) (pediatric): Secondary | ICD-10-CM | POA: Insufficient documentation

## 2016-08-09 DIAGNOSIS — Z6837 Body mass index (BMI) 37.0-37.9, adult: Secondary | ICD-10-CM | POA: Insufficient documentation

## 2016-08-09 DIAGNOSIS — I5022 Chronic systolic (congestive) heart failure: Secondary | ICD-10-CM

## 2016-08-09 DIAGNOSIS — E1142 Type 2 diabetes mellitus with diabetic polyneuropathy: Secondary | ICD-10-CM | POA: Insufficient documentation

## 2016-08-09 DIAGNOSIS — I110 Hypertensive heart disease with heart failure: Secondary | ICD-10-CM | POA: Diagnosis not present

## 2016-08-09 DIAGNOSIS — R000 Tachycardia, unspecified: Secondary | ICD-10-CM | POA: Diagnosis not present

## 2016-08-09 DIAGNOSIS — G8929 Other chronic pain: Secondary | ICD-10-CM | POA: Insufficient documentation

## 2016-08-09 DIAGNOSIS — E785 Hyperlipidemia, unspecified: Secondary | ICD-10-CM | POA: Diagnosis not present

## 2016-08-09 DIAGNOSIS — M545 Low back pain: Secondary | ICD-10-CM | POA: Insufficient documentation

## 2016-08-09 DIAGNOSIS — Z8673 Personal history of transient ischemic attack (TIA), and cerebral infarction without residual deficits: Secondary | ICD-10-CM | POA: Diagnosis not present

## 2016-08-09 DIAGNOSIS — K219 Gastro-esophageal reflux disease without esophagitis: Secondary | ICD-10-CM | POA: Diagnosis not present

## 2016-08-09 DIAGNOSIS — R569 Unspecified convulsions: Secondary | ICD-10-CM | POA: Diagnosis not present

## 2016-08-09 LAB — BASIC METABOLIC PANEL
ANION GAP: 7 (ref 5–15)
BUN: 6 mg/dL (ref 6–20)
CO2: 24 mmol/L (ref 22–32)
Calcium: 9.6 mg/dL (ref 8.9–10.3)
Chloride: 104 mmol/L (ref 101–111)
Creatinine, Ser: 0.65 mg/dL (ref 0.44–1.00)
Glucose, Bld: 187 mg/dL — ABNORMAL HIGH (ref 65–99)
POTASSIUM: 3.7 mmol/L (ref 3.5–5.1)
SODIUM: 135 mmol/L (ref 135–145)

## 2016-08-09 LAB — DIGOXIN LEVEL: DIGOXIN LVL: 0.3 ng/mL — AB (ref 0.8–2.0)

## 2016-08-09 NOTE — Patient Instructions (Signed)
Labs today  Your physician recommends that you schedule a follow-up appointment in: 2 weeks  

## 2016-08-10 NOTE — Progress Notes (Signed)
Patient ID: [name], female   DOB: 1966/07/23, 50 y.o.   MRN: [id number]    Advanced Heart Failure Clinic Note   EP: Dr Graciela Husbands PCP: Dr Ernestine [facility] Primary HF Cardiologist: Dr Shirlee Latch   Angioedema with ACE-I  --Optivol--  HPI: 50 yo with history of morbid obesity, HTN, HLD, CAD, DM2, OSA on CPAP, CVA and combined CHF in the setting of poorly-controlled HTN. She was admitted in 05/2011 with chest pain and new LBBB and ended up getting a left heart cath, which showed no angiographic CAD. Renal artery dopplers were normal in 04/2012.  She has a Medtronic CRT-D device.   Was hospitalized May 2014 with acute on chronic combined systolic and diastolic CHF. EF from December 2013 was down to 40 to 45%.   Admitted in January 2015 with slurred speech, L arm weakness and facial droop. Initial head CT showed no acute abnormality. Repeat head CT showed subtle gray white matter differentiation in R frontal lobe and R operculum perhaps representing ischemic R sided MCA. STAT repeat CT head later on 1/29 showed further evolution of R MCA territory infarct involving R insular cortex and operculum. No hemorrhage. Neurology recommended CTA head/neck, which showed occlusion of MCA branch to the area of R MCA infarct and a normal neck CTA. TEE was done and did not reveal embolic source. Was discharged to inpatient rehab.  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  She was started on Keppra.  She went to the ER in 2/16 with syncope that was probably due to seizure, no arrhythmias with device interrogation.  Echo in 11/16 showed EF back down to 25-30%.    She has been limited since her stroke, walks in house with cane and uses wheelchair for long distances. No dyspnea walking in house.  No orthopnea/PND.  She sleeps on 2 pillows.  She was in the ER earlier this month with atypical chest pain.  She had a negative workup.  No further chest pain.  She is  using CPAP. BP is very high today.  She did not take her medications as she has not had breakfast.  Her meds are in her car.   ECHO 11/2012: 40-45% ECHO  07/2013: 20% RV normal ECHO    08/23/2013: EF 40-45%, diff HK ECHO   09/05/2013: EF 25-30%, diff HK  ECHO  12/11/13: EF 30%, diff HK, RV normal ECHO 01/14/2014: EF 35-40%, LA mod dilated  ECHO 4/16: EF 40-45%, moderate LVH, normal RV size with mildly decreased systolic function.   ECHO 11/16: EF 25-30%, moderate LVH  Labs 06/2013: proBNP 230, Cr 0.9, K 3.7          08/2013: K+ 3.9, Cr 0.9       09/2013: K 4.2, Cr 0.86, pro-BNP 718, dig level <0.3     10/07/13: K+ 4.1, Cr 0.83       05/08/14: K 4.0 Creatinine 0.67 Pro BNP 233 Dig level 0.3        08/20/2014 K 3.8 nCreatinine 0.56        2/16: K 3.6, creatinine 0.95, LDL 106, HDL 43       3/16: K 3.5, creatinine 0.87, HCT 35.9       7/16: K 3.5, creatinine 0.78. HCT 37       10/16: digoxin 0.2, LDL 116       1/17: K 3.6, creatinine 0.77, HCT 38       5/17:  digoxin 0.9       8/17: K 4.2, creatinine 0.68, HCT 35.7  ROS: All systems negative except as listed in HPI, PMH and Problem List.  Past Medical History:  Diagnosis Date  . Allergic rhinitis   . Arthritis    "hips, back, legs, arms" (07/04/2014)  . Asthma    hx  . Automatic implantable cardioverter-defibrillator in situ   . Calcifying tendinitis of shoulder   . Chronic combined systolic and diastolic CHF (congestive heart failure) (HCC)    EF 40-45% by echo 12/06/2012  . Chronic diastolic heart failure (HCC)     Primarily diastolic CHF: Likely due to uncontrolled HTN. Last echo (8/12) with EF 45-50%, mild to moderate LVH with some asymmetric septal hypertrophy, RV normal size and systolic function. EF 50-55% by LV-gram in 6/12.   . Chronic lower back pain    secondary to DJD, obsetiy, hip problems. Followed by Dr. Ivory Broad (pain management)  . Coronary artery disease    questionable. LHC 05/2011 showing normal coronaries //  Followed at [facility], Dr. Shirlee Latch  . Degeneration of lumbar or lumbosacral intervertebral disc   . DJD (degenerative joint disease) of hip    right sided  . Frequent UTI   . GERD (gastroesophageal reflux disease)   . HLD (hyperlipidemia)   . Hypertension    Poorly controlled. Has had HTN since age 89. Angioedema with ACEI.  24 Hr urine and renal arterial dopplers ordered . . . Never done  . LBBB (left bundle branch block)   . Liver disease   . Morbid obesity (HCC)   . NICM (nonischemic cardiomyopathy) (HCC)    EF 45-50% in 8/12, cath 6/12 showed normal coronaries, EF 50-55% by LV gram  . OSA on CPAP    sleep study in 8/12 showed moderate to severe OSA requiring CPAP  . Polyneuropathy in diabetes(357.2)   . Presence of permanent cardiac pacemaker   . Seizures (HCC)    last 3 months  . Shortness of breath    none now  . Stroke [facility]) 12/2013   "my left side is paralyzed" (07/04/2014)  . Thoracic or lumbosacral neuritis or radiculitis, unspecified   . Type II diabetes mellitus (HCC) DX: 2002    Current Outpatient Prescriptions  Medication Sig Dispense Refill  . ACCU-CHEK FASTCLIX LANCETS MISC Use 2 to 3 times daily to check blood sugar. diag code E11.40. Insulin dependent 306 each 3  . amLODipine (NORVASC) 10 MG tablet TAKE 1 TABLET BY MOUTH EVERY DAY 30 tablet 6  . aspirin 81 MG tablet Take 1 tablet (81 mg total) by mouth daily. 90 tablet 6  . atorvastatin (LIPITOR) 40 MG tablet Take 1 tablet (40 mg total) by mouth daily. 90 tablet 3  . calamine lotion Apply 1 application topically as needed for itching. 120 mL 0  . citalopram (CELEXA) 20 MG tablet TAKE 1 TABLET BY MOUTH EVERY DAY 90 tablet 1  . cyclobenzaprine (FLEXERIL) 10 MG tablet TAKE ONE TABLET BY MOUTH 3 TIMES A DAY IF NEEDED FOR MUSCLE SPASMS 90 tablet 1  . diclofenac sodium (VOLTAREN) 1 % GEL Apply 2 g topically 4 (four) times daily as needed (pain). 5 Tube 5  . digoxin (LANOXIN) 0.125 MG tablet Take 1 tablet (0.125  mg total) by mouth daily. 30 tablet 5  . docusate sodium (COLACE) 50 MG capsule Take 1 capsule (50 mg total) by mouth 2 (two) times daily as needed for mild constipation. 60 capsule 6  .  fluticasone (CUTIVATE) 0.05 % cream Apply topically 2 (two) times daily. Dispense two tubes (Patient taking differently: Apply 1 application topically 2 (two) times daily. ) 30 g 6  . furosemide (LASIX) 40 MG tablet Take 1 tablet (40 mg total) by mouth 2 (two) times a week. 10 tablet 6  . glucose blood (ACCU-CHEK SMARTVIEW) test strip Use to Check Blood Sugar TID. Code: E11.40. 100 each 12  . hydrALAZINE (APRESOLINE) 50 MG tablet TAKE ONE TABLET BY MOUTH 3 TIMES A DAY 90 tablet 0  . Insulin Glargine (LANTUS SOLOSTAR) 100 UNIT/ML Solostar Pen Inject 45 Units into the skin daily at 10 pm. Inject 45 units in the PM. 45 mL 3  . Insulin Pen Needle (GLOBAL EASE INJECT PEN NEEDLES) 31G X 5 MM MISC Use to inject insulin BID. Code: E 11.40 100 each 10  . ipratropium (ATROVENT) 0.06 % nasal spray Place 2 sprays into both nostrils 4 (four) times daily. 15 mL 1  . isosorbide mononitrate (IMDUR) 60 MG 24 hr tablet TAKE 1 & 1/2 TABLETS BY MOUTH EVERY DAY 45 tablet 3  . levETIRAcetam (KEPPRA) 500 MG tablet TAKE 1 TABLET BY MOUTH TWO TIMES DAILY **MUST NOT MISS ANY DOSE** 60 tablet 0  . metFORMIN (GLUCOPHAGE) 1000 MG tablet TAKE 1 TABLET BY MOUTH TWO TIMES DAILY WITH A MEAL 180 tablet 1  . metoprolol succinate (TOPROL-XL) 100 MG 24 hr tablet TAKE 1 TABLET BY MOUTH TWO TIMES DAILY 60 tablet 3  . mupirocin cream (BACTROBAN) 2 % Apply 1 application topically 2 (two) times daily. 15 g 2  . nystatin cream (MYCOSTATIN) APPLY UNDER ABDOMINAL SKIN AND ON BUTTOCKS 2 TIMES A DAY UNTIL RASH RESOLVES 30 g 0  . ondansetron (ZOFRAN ODT) 8 MG disintegrating tablet Take 1 tablet (8 mg total) by mouth every 8 (eight) hours as needed for nausea or vomiting. 10 tablet 0  . oxyCODONE-acetaminophen (PERCOCET) 10-325 MG tablet Take 1 tablet by mouth  every 8 (eight) hours as needed for pain. 90 tablet 0  . pantoprazole (PROTONIX) 40 MG tablet Take 40 mg by mouth daily.    . polyethylene glycol (MIRALAX / GLYCOLAX) packet MIX 1 PACKET IN 8OZ OF LIQUID EVERY DAY 14 each 0  . solifenacin (VESICARE) 10 MG tablet Take 10 mg by mouth daily.    [name] spironolactone (ALDACTONE) 50 MG tablet TAKE ONE TABLET BY MOUTH EVERY DAY 30 tablet 0  . triamcinolone (KENALOG) 0.025 % ointment APPLY TOPICALLY TO LEFT ARM 2 TIMES A DAY UNTIL RASH AND ITCHING RESOLVES 30 g 0  . Vitamin D, Ergocalciferol, (DRISDOL) 50000 units CAPS capsule TAKE 1 CAPSULE BY MOUTH EVERY MONDAY 12 capsule 1   No current facility-administered medications for this encounter.    Vitals:   08/09/16 1051 08/09/16 1212  BP: (!) 230/122 (!) 176/110  BP Location: Right Arm   Patient Position: Sitting   Cuff Size: Normal   Pulse: (!) 112   SpO2: 98%   Weight: 255 lb 12.8 oz (116 kg)     Wt Readings from Last 3 Encounters:  08/09/16 255 lb 12.8 oz (116 kg)  07/20/16 259 lb (117.5 kg)  07/14/16 260 lb 11.2 oz (118.3 kg)     PHYSICAL EXAM: General:  NAD. No resp difficulty; Neck: supple. Thick. JVP 7 cm. Carotids 2+ bilaterally; no bruits. Thyroid symmetrically enlarged.  Cor: PMI nonpalpable; RRR. No rubs, gallops or murmurs. No S3/S4.  Lungs: clear Abdomen: obese, soft, NT, ND, no HSM. No bruits or masses. +  BS  Extremities: no cyanosis, clubbing, rash, No ankle edema.  Neuro: alert & orientedx3, cranial nerves grossly intact. Moves all 4 extremities w/o difficulty. Affect pleasant.  ASSESSMENT/PLAN: 1. Chronic systolic HF: Nonischemic cardiomyopathy, normal coronaries on 6/12 angiography.  Cardiomyopathy may be due to long-standing and poorly-controlled HTN.  Echo 11/16 EF 25-30%.  S/P CRT-D Medtronic.  NYHA class II-III symptoms, Volume status looks ok.   - Continue Lasix twice weekly. - Continue current spironolactone, digoxin, and Toprol XL.  Check BMET and digoxin level today.     - Continue hydralazine100 mg tid and Imdur 90 mg daily.   - No ACE-I/ARB/ARNI due to angioedema.   2. HTN: BP is very high but she did not take her am meds.  We will get them from her car and give her her morning meds here.  Will have her wait here until BP comes down to a more safe level.   3. Morbid obesity: Exercise limited. Needs to be more active.   4. OSA on CPAP:  Using CPAP.   5. CVA:  Continue ASA and atorvastatin.  I am going to refer her into the paramedicine program to help with her meds, etc.    Followup in 2 weeks with the PA, needs to take all her meds prior to clinic appt.   Marca Ancona  08/10/2016

## 2016-08-11 ENCOUNTER — Emergency Department (HOSPITAL_COMMUNITY)
Admission: EM | Admit: 2016-08-11 | Discharge: 2016-08-12 | Disposition: A | Discharge status: Home / Self Care | Payer: Medicaid Other | Attending: Emergency Medicine | Admitting: Emergency Medicine

## 2016-08-11 DIAGNOSIS — Z8673 Personal history of transient ischemic attack (TIA), and cerebral infarction without residual deficits: Secondary | ICD-10-CM | POA: Insufficient documentation

## 2016-08-11 DIAGNOSIS — Z950 Presence of cardiac pacemaker: Secondary | ICD-10-CM | POA: Insufficient documentation

## 2016-08-11 DIAGNOSIS — Z79899 Other long term (current) drug therapy: Secondary | ICD-10-CM | POA: Diagnosis not present

## 2016-08-11 DIAGNOSIS — I2510 Atherosclerotic heart disease of native coronary artery without angina pectoris: Secondary | ICD-10-CM | POA: Diagnosis not present

## 2016-08-11 DIAGNOSIS — Z7984 Long term (current) use of oral hypoglycemic drugs: Secondary | ICD-10-CM | POA: Insufficient documentation

## 2016-08-11 DIAGNOSIS — Z794 Long term (current) use of insulin: Secondary | ICD-10-CM | POA: Insufficient documentation

## 2016-08-11 DIAGNOSIS — G40909 Epilepsy, unspecified, not intractable, without status epilepticus: Secondary | ICD-10-CM | POA: Insufficient documentation

## 2016-08-11 DIAGNOSIS — E1140 Type 2 diabetes mellitus with diabetic neuropathy, unspecified: Secondary | ICD-10-CM | POA: Insufficient documentation

## 2016-08-11 DIAGNOSIS — Z7982 Long term (current) use of aspirin: Secondary | ICD-10-CM | POA: Insufficient documentation

## 2016-08-11 DIAGNOSIS — R569 Unspecified convulsions: Secondary | ICD-10-CM

## 2016-08-11 DIAGNOSIS — I110 Hypertensive heart disease with heart failure: Secondary | ICD-10-CM | POA: Diagnosis not present

## 2016-08-11 DIAGNOSIS — I5042 Chronic combined systolic (congestive) and diastolic (congestive) heart failure: Secondary | ICD-10-CM | POA: Diagnosis not present

## 2016-08-11 DIAGNOSIS — T422X6A Underdosing of succinimides and oxazolidinediones, initial encounter: Secondary | ICD-10-CM | POA: Diagnosis not present

## 2016-08-11 DIAGNOSIS — Y636 Underdosing and nonadministration of necessary drug, medicament or biological substance: Secondary | ICD-10-CM | POA: Insufficient documentation

## 2016-08-11 LAB — CBC WITH DIFFERENTIAL/PLATELET
BASOS ABS: 0 10*3/uL (ref 0.0–0.1)
BASOS PCT: 0 %
Eosinophils Absolute: 0.2 10*3/uL (ref 0.0–0.7)
Eosinophils Relative: 1 %
HEMATOCRIT: 42.3 % (ref 36.0–46.0)
Hemoglobin: 13.6 g/dL (ref 12.0–15.0)
Lymphocytes Relative: 28 %
Lymphs Abs: 3.9 10*3/uL (ref 0.7–4.0)
MCH: 27.4 pg (ref 26.0–34.0)
MCHC: 32.2 g/dL (ref 30.0–36.0)
MCV: 85.1 fL (ref 78.0–100.0)
MONO ABS: 1 10*3/uL (ref 0.1–1.0)
Monocytes Relative: 7 %
NEUTROS ABS: 8.8 10*3/uL — AB (ref 1.7–7.7)
Neutrophils Relative %: 64 %
PLATELETS: 271 10*3/uL (ref 150–400)
RBC: 4.97 MIL/uL (ref 3.87–5.11)
RDW: 13.8 % (ref 11.5–15.5)
WBC: 13.9 10*3/uL — ABNORMAL HIGH (ref 4.0–10.5)

## 2016-08-11 LAB — COMPREHENSIVE METABOLIC PANEL
ALBUMIN: 4.3 g/dL (ref 3.5–5.0)
ALT: 22 U/L (ref 14–54)
AST: 41 U/L (ref 15–41)
Alkaline Phosphatase: 58 U/L (ref 38–126)
Anion gap: 11 (ref 5–15)
BILIRUBIN TOTAL: 0.8 mg/dL (ref 0.3–1.2)
BUN: 9 mg/dL (ref 6–20)
CHLORIDE: 101 mmol/L (ref 101–111)
CO2: 22 mmol/L (ref 22–32)
Calcium: 9.4 mg/dL (ref 8.9–10.3)
Creatinine, Ser: 0.9 mg/dL (ref 0.44–1.00)
GFR calc Af Amer: >60 mL/min (ref >60)
GFR calc non Af Amer: >60 mL/min (ref >60)
GLUCOSE: 239 mg/dL — AB (ref 65–99)
POTASSIUM: 4.5 mmol/L (ref 3.5–5.1)
Sodium: 134 mmol/L — ABNORMAL LOW (ref 135–145)
Total Protein: 7.7 g/dL (ref 6.5–8.1)

## 2016-08-11 MED ORDER — HYDRALAZINE HCL 20 MG/ML IJ SOLN
20.0000 mg | Freq: Once | INTRAMUSCULAR | Status: DC
  Filled 2016-08-11: qty 1

## 2016-08-11 MED ORDER — SODIUM CHLORIDE 0.9 % IV SOLN
1000.0000 mg | Freq: Once | INTRAVENOUS | Status: AC
  Administered 2016-08-11: 1000 mg via INTRAVENOUS
  Filled 2016-08-11: qty 10

## 2016-08-11 NOTE — ED Triage Notes (Signed)
Pt presents to the ed with ems after having a seizure at dinner witnessed by family, patient was posti-ictal with ems and is alert and oriented here in the ed, patient does have paralysis on the left side from a previous stroke

## 2016-08-11 NOTE — Discharge Instructions (Signed)
Do not skip your Keppra dosages.  Continue your current medicines at their regular time

## 2016-08-12 NOTE — ED Provider Notes (Signed)
[facility]-EMERGENCY DEPT Provider Note   CSN: [id number] Arrival date & time: 08/11/16  2143     History   Chief Complaint Chief Complaint  Patient presents with  . Seizures    HPI [name] is a 50 y.o. female. She has history of hypertension, and seizure disorder. She did not take her Keppra today. Did not take her morning blood pressure medications. Had a seizure witnessed by her family tonight and a restaurant. Postictal on arrival paramedics. Baseline status upon her arrival here. Did not bite her tongue and was comments. No injury. States she feels "fine, but tired".  HPI  Past Medical History:  Diagnosis Date  . Allergic rhinitis   . Arthritis    "hips, back, legs, arms" (07/04/2014)  . Asthma    hx  . Automatic implantable cardioverter-defibrillator in situ   . Calcifying tendinitis of shoulder   . Chronic combined systolic and diastolic CHF (congestive heart failure) (HCC)    EF 40-45% by echo 12/06/2012  . Chronic diastolic heart failure (HCC)     Primarily diastolic CHF: Likely due to uncontrolled HTN. Last echo (8/12) with EF 45-50%, mild to moderate LVH with some asymmetric septal hypertrophy, RV normal size and systolic function. EF 50-55% by LV-gram in 6/12.   . Chronic lower back pain    secondary to DJD, obsetiy, hip problems. Followed by Dr. Ivory Broad (pain management)  . Coronary artery disease    questionable. LHC 05/2011 showing normal coronaries // Followed at [facility], Dr. Shirlee Latch  . Degeneration of lumbar or lumbosacral intervertebral disc   . DJD (degenerative joint disease) of hip    right sided  . Frequent UTI   . GERD (gastroesophageal reflux disease)   . HLD (hyperlipidemia)   . Hypertension    Poorly controlled. Has had HTN since age 17. Angioedema with ACEI.  24 Hr urine and renal arterial dopplers ordered . . . Never done  . LBBB (left bundle branch block)   . Liver disease   . Morbid obesity (HCC)   . NICM (nonischemic  cardiomyopathy) (HCC)    EF 45-50% in 8/12, cath 6/12 showed normal coronaries, EF 50-55% by LV gram  . OSA on CPAP    sleep study in 8/12 showed moderate to severe OSA requiring CPAP  . Polyneuropathy in diabetes(357.2)   . Presence of permanent cardiac pacemaker   . Seizures (HCC)    last 3 months  . Shortness of breath    none now  . Stroke [facility]) 12/2013   "my left side is paralyzed" (07/04/2014)  . Thoracic or lumbosacral neuritis or radiculitis, unspecified   . Type II diabetes mellitus (HCC) DX: 2002    Patient Active Problem List   Diagnosis Date Noted  . Papular eruption 07/14/2016  . Type 2 diabetes mellitus, uncontrolled, with neuropathy (HCC) 02/04/2016  . Contact dermatitis 02/03/2016  . Preoperative cardiovascular examination 12/05/2015  . Left spastic hemiparesis (HCC) 09/23/2015  . Constipation 09/09/2015  . HTN, goal below 130/80 07/15/2015  . Right shoulder pain 06/30/2015  . Localization-related symptomatic epilepsy and epileptic syndromes with complex partial seizures, not intractable, without status epilepticus (HCC) 06/13/2015  . Hemiparesis affecting left side as late effect of cerebrovascular accident (HCC) 06/13/2015  . Morbid obesity (HCC) 06/13/2015  . Ulcer of left foot (HCC) 06/11/2015  . Candidal intertrigo 11/05/2014  . History of seizure 10/14/2014  . Chronic diastolic heart failure (HCC)   . Vitamin D deficiency 10/01/2014  . Tachycardia 07/04/2014  .  Generalized weakness 07/04/2014  . Perimenopausal menorrhagia 12/20/2013  . Stroke (HCC) 12/13/2013  . Biventricular implantable cardiac defibrillator -[employer] 11/23/2013  . Mixed stress and urge urinary incontinence 10/11/2013  . Preventative health care 09/13/2013  . Chronic combined systolic and diastolic heart failure (HCC) 08/01/2013  . Nonischemic cardiomyopathy (HCC) 08/01/2013  . Goiter 08/01/2013  . LBBB (left bundle branch block) 07/19/2013  . Depression 04/09/2013  . Primary  osteoarthritis of left knee 04/14/2012  . Lumbar spondylosis 04/14/2012  . Diabetic peripheral neuropathy (HCC) 04/14/2012  . Hyperlipidemia 11/23/2011  . OSA (obstructive sleep apnea) 08/27/2011  . Benign essential HTN 08/27/2010    Past Surgical History:  Procedure Laterality Date  . BI-VENTRICULAR IMPLANTABLE CARDIOVERTER DEFIBRILLATOR N/A 08/22/2013   Procedure: BI-VENTRICULAR IMPLANTABLE CARDIOVERTER DEFIBRILLATOR  (CRT-D);  Surgeon: [name], MD;  Location: [facility] CATH LAB;  Service: Cardiovascular;  Laterality: N/A;  . BI-VENTRICULAR IMPLANTABLE CARDIOVERTER DEFIBRILLATOR  (CRT-D)  08/2013   [name] 08/23/2013  . BREAST SURGERY Bilateral 2011   patient reports benign results  . CARDIAC CATHETERIZATION  05/2011  . CARPAL TUNNEL RELEASE Left    denies  . HEMIARTHROPLASTY SHOULDER FRACTURE Right 1980's   denies  . MULTIPLE TOOTH EXTRACTIONS  ~ 2011   tumors removed ; "my whole top"  . SHOULDER ARTHROSCOPY Right 12/26/2015   Procedure: Right Shoulder Arthroscopy, Debridement, and Decompression;  Surgeon: [name], MD;  Location: [facility] OR;  Service: Orthopedics;  Laterality: Right;  . TEE WITHOUT CARDIOVERSION N/A 01/14/2014   Procedure: TRANSESOPHAGEAL ECHOCARDIOGRAM (TEE);  Surgeon: Thurmon Fair, MD;  Location: [facility] ENDOSCOPY;  Service: Cardiovascular;  Laterality: N/A;  . TUBAL LIGATION  05/31/1985    OB History    No data available       Home Medications    Prior to Admission medications   Medication Sig Start Date End Date Taking? Authorizing Provider  amLODipine (NORVASC) 10 MG tablet TAKE 1 TABLET BY MOUTH EVERY DAY 07/19/16  Yes Dolores Patty, MD  aspirin 81 MG tablet Take 1 tablet (81 mg total) by mouth daily. 12/29/15  Yes Laurey Morale, MD  atorvastatin (LIPITOR) 40 MG tablet Take 1 tablet (40 mg total) by mouth daily. 09/26/15  Yes Laurey Morale, MD  calamine lotion Apply 1 application topically as needed for itching. 07/14/16  Yes Alexa Lucrezia Starch, MD    citalopram (CELEXA) 20 MG tablet TAKE 1 TABLET BY MOUTH EVERY DAY 04/13/16  Yes Courtney Paris, MD  cyclobenzaprine (FLEXERIL) 10 MG tablet TAKE ONE TABLET BY MOUTH 3 TIMES A DAY IF NEEDED FOR MUSCLE SPASMS 06/28/16  Yes Ranelle Oyster, MD  diclofenac sodium (VOLTAREN) 1 % GEL Apply 2 g topically 4 (four) times daily as needed (pain). 03/03/16  Yes Ranelle Oyster, MD  digoxin (LANOXIN) 0.125 MG tablet Take 1 tablet (0.125 mg total) by mouth daily. 05/17/16  Yes Amy D Clegg, NP  docusate sodium (COLACE) 50 MG capsule Take 1 capsule (50 mg total) by mouth 2 (two) times daily as needed for mild constipation. 09/09/15  Yes Courtney Paris, MD  fluticasone (CUTIVATE) 0.05 % cream Apply topically 2 (two) times daily. Dispense two tubes Patient taking differently: Apply 1 application topically 2 (two) times daily.  02/25/15  Yes Courtney Paris, MD  furosemide (LASIX) 40 MG tablet Take 1 tablet (40 mg total) by mouth 2 (two) times a week. 01/13/16  Yes Amy D Clegg, NP  hydrALAZINE (APRESOLINE) 50 MG tablet TAKE ONE TABLET BY MOUTH  3 TIMES A DAY 07/19/16  Yes Levert Feinstein, MD  Insulin Glargine (LANTUS SOLOSTAR) 100 UNIT/ML Solostar Pen Inject 45 Units into the skin daily at 10 pm. Inject 45 units in the PM. 02/03/16  Yes Courtney Paris, MD  ipratropium (ATROVENT) 0.06 % nasal spray Place 2 sprays into both nostrils 4 (four) times daily. 01/07/15  Yes Linna Hoff, MD  isosorbide mononitrate (IMDUR) 60 MG 24 hr tablet TAKE 1 & 1/2 TABLETS BY MOUTH EVERY DAY 07/19/16  Yes Laurey Morale, MD  levETIRAcetam (KEPPRA) 500 MG tablet TAKE 1 TABLET BY MOUTH TWO TIMES DAILY **MUST NOT MISS ANY DOSE** 07/19/16  Yes Levert Feinstein, MD  metFORMIN (GLUCOPHAGE) 1000 MG tablet TAKE 1 TABLET BY MOUTH TWO TIMES DAILY WITH A MEAL 05/18/16  Yes Courtney Paris, MD  metoprolol succinate (TOPROL-XL) 100 MG 24 hr tablet TAKE 1 TABLET BY MOUTH TWO TIMES DAILY 07/19/16  Yes Laurey Morale, MD  mupirocin cream (BACTROBAN) 2 % Apply 1 application  topically 2 (two) times daily. 07/14/16  Yes Alexa Lucrezia Starch, MD  nystatin cream (MYCOSTATIN) APPLY UNDER ABDOMINAL SKIN AND ON BUTTOCKS 2 TIMES A DAY UNTIL RASH RESOLVES 04/13/16  Yes Courtney Paris, MD  ondansetron (ZOFRAN ODT) 8 MG disintegrating tablet Take 1 tablet (8 mg total) by mouth every 8 (eight) hours as needed for nausea or vomiting. 07/20/16  Yes Azalia Bilis, MD  oxyCODONE-acetaminophen (PERCOCET) 10-325 MG tablet Take 1 tablet by mouth every 8 (eight) hours as needed for pain. 07/28/16  Yes Ranelle Oyster, MD  pantoprazole (PROTONIX) 40 MG tablet Take 40 mg by mouth daily.   Yes Historical Provider, MD  polyethylene glycol (MIRALAX / GLYCOLAX) packet MIX 1 PACKET IN 8OZ OF LIQUID EVERY DAY 08/04/16  Yes Burns Spain, MD  solifenacin (VESICARE) 10 MG tablet Take 10 mg by mouth daily.   Yes Historical Provider, MD  spironolactone (ALDACTONE) 50 MG tablet TAKE ONE TABLET BY MOUTH EVERY DAY 07/19/16  Yes Levert Feinstein, MD  triamcinolone (KENALOG) 0.025 % ointment APPLY TOPICALLY TO LEFT ARM 2 TIMES A DAY UNTIL RASH AND ITCHING RESOLVES 07/19/16  Yes Levert Feinstein, MD  Vitamin D, Ergocalciferol, (DRISDOL) 50000 units CAPS capsule TAKE 1 CAPSULE BY MOUTH EVERY MONDAY 06/02/16  Yes Courtney Paris, MD    Family History Family History  Problem Relation Age of Onset  . Heart disease Mother 79    Died of MI at age 25 yo  . Kidney disease Mother     requiring dialysis  . Congestive Heart Failure Mother   . Heart disease Father 82    MI age 28yo requiring stenting  . Diabetes Father   . Glaucoma Father   . Heart disease Paternal Grandmother     requiring pacemaker.  [name] Heart disease Paternal Grandfather 57    Died of MI at possibly age 37-53yo  . Stroke Paternal Grandfather   . Diabetes Brother   . Heart disease Brother 28    MI at age 98 years old  . Breast cancer Paternal Aunt   . Breast cancer Maternal Grandmother     Social History Social History  Substance Use Topics    . Smoking status: Never Smoker  . Smokeless tobacco: Never Used  . Alcohol use No     Allergies   Ace inhibitors; Clindamycin; Enalapril maleate; and Lisinopril   Review of Systems Review of Systems  Constitutional: Negative for appetite change, chills, diaphoresis,  fatigue and fever.  HENT: Negative for mouth sores, sore throat and trouble swallowing.   Eyes: Negative for visual disturbance.  Respiratory: Negative for cough, chest tightness, shortness of breath and wheezing.   Cardiovascular: Negative for chest pain.  Gastrointestinal: Negative for abdominal distention, abdominal pain, diarrhea, nausea and vomiting.  Endocrine: Negative for polydipsia, polyphagia and polyuria.  Genitourinary: Negative for dysuria, frequency and hematuria.  Musculoskeletal: Negative for gait problem.  Skin: Negative for color change, pallor and rash.  Neurological: Positive for seizures. Negative for dizziness, syncope, light-headedness and headaches.  Hematological: Does not bruise/bleed easily.  Psychiatric/Behavioral: Negative for behavioral problems and confusion.     Physical Exam Updated Vital Signs BP 140/82   Pulse 102   Temp 97.6 F (36.4 C) (Oral)   Resp 15   Ht 5\' 9"  (1.753 m)   Wt 255 lb 11.7 oz (116 kg)   SpO2 94%   BMI 37.77 kg/m   Physical Exam  Constitutional: She is oriented to person, place, and time. She appears well-developed and well-nourished. No distress.  HENT:  Head: Normocephalic.  Eyes: Conjunctivae are normal. Pupils are equal, round, and reactive to light. No scleral icterus.  Neck: Normal range of motion. Neck supple. No thyromegaly present.  Cardiovascular: Normal rate and regular rhythm.  Exam reveals no gallop and no friction rub.   No murmur heard. Pulmonary/Chest: Effort normal and breath sounds normal. No respiratory distress. She has no wheezes. She has no rales.  Abdominal: Soft. Bowel sounds are normal. She exhibits no distension. There is no  tenderness. There is no rebound.  Musculoskeletal: Normal range of motion.  Neurological: She is alert and oriented to person, place, and time.  Skin: Skin is warm and dry. No rash noted.  Psychiatric: She has a normal mood and affect. Her behavior is normal.     ED Treatments / Results  Labs (all labs ordered are listed, but only abnormal results are displayed) Labs Reviewed  CBC WITH DIFFERENTIAL/PLATELET - Abnormal; Notable for the following:       Result Value   WBC 13.9 (*)    Neutro Abs 8.8 (*)    All other components within normal limits  COMPREHENSIVE METABOLIC PANEL - Abnormal; Notable for the following:    Sodium 134 (*)    Glucose, Bld 239 (*)    All other components within normal limits    EKG  EKG Interpretation None       Radiology No results found.  Procedures Procedures (including critical care time)  Medications Ordered in ED Medications  hydrALAZINE (APRESOLINE) injection 20 mg (0 mg Intravenous Hold 08/11/16 2308)  levETIRAcetam (KEPPRA) 1,000 mg in sodium chloride 0.9 % 100 mL IVPB (0 mg Intravenous Stopped 08/11/16 2243)     Initial Impression / Assessment and Plan / ED Course  I have reviewed the triage vital signs and the nursing notes.  Pertinent labs & imaging results that were available during my care of the patient were reviewed by me and considered in my medical decision making (see chart for details).  Clinical Course    Blood pressure improves. Loaded with Keppra. Appropriate for discharge home.  Final Clinical Impressions(s) / ED Diagnoses   Final diagnoses:  Seizure [facility])    New Prescriptions New Prescriptions   No medications on file     [facility], MD 08/12/16 0002

## 2016-08-12 NOTE — ED Notes (Signed)
Pt provided with d/c instructions at this time. Pt verbalizes understanding of d/c instructions as well as follow up procedure after d/c.  No new RX at time of d/c.  Pt in no apparent distress at this time. Pt assisted out of the ED via WC at this time.   

## 2016-08-13 ENCOUNTER — Ambulatory Visit: Payer: Medicaid Other

## 2016-08-17 ENCOUNTER — Other Ambulatory Visit (HOSPITAL_COMMUNITY): Payer: Self-pay | Admitting: Adult Health

## 2016-08-17 ENCOUNTER — Encounter (HOSPITAL_COMMUNITY): Payer: Self-pay | Admitting: Emergency Medicine

## 2016-08-17 ENCOUNTER — Other Ambulatory Visit: Payer: Self-pay | Admitting: Physical Medicine & Rehabilitation

## 2016-08-17 ENCOUNTER — Other Ambulatory Visit: Payer: Self-pay | Admitting: Oncology

## 2016-08-17 ENCOUNTER — Telehealth: Payer: Self-pay | Admitting: Internal Medicine

## 2016-08-17 DIAGNOSIS — M25562 Pain in left knee: Secondary | ICD-10-CM | POA: Insufficient documentation

## 2016-08-17 DIAGNOSIS — Z79899 Other long term (current) drug therapy: Secondary | ICD-10-CM

## 2016-08-17 DIAGNOSIS — G8114 Spastic hemiplegia affecting left nondominant side: Secondary | ICD-10-CM

## 2016-08-17 DIAGNOSIS — Z8673 Personal history of transient ischemic attack (TIA), and cerebral infarction without residual deficits: Secondary | ICD-10-CM | POA: Diagnosis not present

## 2016-08-17 DIAGNOSIS — I5042 Chronic combined systolic (congestive) and diastolic (congestive) heart failure: Secondary | ICD-10-CM | POA: Diagnosis not present

## 2016-08-17 DIAGNOSIS — M542 Cervicalgia: Secondary | ICD-10-CM | POA: Diagnosis not present

## 2016-08-17 DIAGNOSIS — Y939 Activity, unspecified: Secondary | ICD-10-CM | POA: Diagnosis not present

## 2016-08-17 DIAGNOSIS — E1140 Type 2 diabetes mellitus with diabetic neuropathy, unspecified: Secondary | ICD-10-CM

## 2016-08-17 DIAGNOSIS — E1142 Type 2 diabetes mellitus with diabetic polyneuropathy: Secondary | ICD-10-CM

## 2016-08-17 DIAGNOSIS — Y92410 Unspecified street and highway as the place of occurrence of the external cause: Secondary | ICD-10-CM | POA: Diagnosis not present

## 2016-08-17 DIAGNOSIS — M545 Low back pain: Secondary | ICD-10-CM | POA: Diagnosis present

## 2016-08-17 DIAGNOSIS — I110 Hypertensive heart disease with heart failure: Secondary | ICD-10-CM | POA: Insufficient documentation

## 2016-08-17 DIAGNOSIS — Z794 Long term (current) use of insulin: Secondary | ICD-10-CM | POA: Diagnosis not present

## 2016-08-17 DIAGNOSIS — Z7982 Long term (current) use of aspirin: Secondary | ICD-10-CM | POA: Diagnosis not present

## 2016-08-17 DIAGNOSIS — Z95810 Presence of automatic (implantable) cardiac defibrillator: Secondary | ICD-10-CM | POA: Insufficient documentation

## 2016-08-17 DIAGNOSIS — J45909 Unspecified asthma, uncomplicated: Secondary | ICD-10-CM | POA: Insufficient documentation

## 2016-08-17 DIAGNOSIS — I2510 Atherosclerotic heart disease of native coronary artery without angina pectoris: Secondary | ICD-10-CM | POA: Insufficient documentation

## 2016-08-17 DIAGNOSIS — IMO0002 Reserved for concepts with insufficient information to code with codable children: Secondary | ICD-10-CM

## 2016-08-17 DIAGNOSIS — Y999 Unspecified external cause status: Secondary | ICD-10-CM | POA: Diagnosis not present

## 2016-08-17 DIAGNOSIS — E1165 Type 2 diabetes mellitus with hyperglycemia: Secondary | ICD-10-CM

## 2016-08-17 DIAGNOSIS — Z7984 Long term (current) use of oral hypoglycemic drugs: Secondary | ICD-10-CM | POA: Insufficient documentation

## 2016-08-17 DIAGNOSIS — I63311 Cerebral infarction due to thrombosis of right middle cerebral artery: Secondary | ICD-10-CM

## 2016-08-17 DIAGNOSIS — Z5181 Encounter for therapeutic drug level monitoring: Secondary | ICD-10-CM
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
## 2016-08-17 NOTE — Telephone Encounter (Signed)
T. REMINDER CALL, NO ANSWER, NO VOICEMAIL

## 2016-08-18 ENCOUNTER — Encounter: Payer: Self-pay | Admitting: Internal Medicine

## 2016-08-18 ENCOUNTER — Emergency Department (HOSPITAL_COMMUNITY)
Admission: EM | Admit: 2016-08-18 | Discharge: 2016-08-18 | Disposition: A | Discharge status: Home / Self Care | Payer: No Typology Code available for payment source | Attending: Emergency Medicine | Admitting: Emergency Medicine

## 2016-08-18 ENCOUNTER — Ambulatory Visit: Payer: Medicaid Other

## 2016-08-18 ENCOUNTER — Emergency Department (HOSPITAL_COMMUNITY): Payer: No Typology Code available for payment source

## 2016-08-18 DIAGNOSIS — M545 Low back pain: Secondary | ICD-10-CM | POA: Diagnosis not present

## 2016-08-18 MED ORDER — NAPROXEN 500 MG PO TABS: 500.0000 mg | ORAL_TABLET | Freq: Two times a day (BID) | ORAL | 0 refills | Status: DC

## 2016-08-18 MED ORDER — KETOROLAC TROMETHAMINE 15 MG/ML IJ SOLN
15.0000 mg | Freq: Once | INTRAMUSCULAR | Status: AC
  Administered 2016-08-18: 15 mg via INTRAMUSCULAR
  Filled 2016-08-18: qty 1
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
## 2016-08-20 ENCOUNTER — Other Ambulatory Visit: Payer: Self-pay | Admitting: Oncology

## 2016-08-20 DIAGNOSIS — I5042 Chronic combined systolic (congestive) and diastolic (congestive) heart failure: Secondary | ICD-10-CM

## 2016-08-20 DIAGNOSIS — I10 Essential (primary) hypertension: Secondary | ICD-10-CM

## 2016-08-20 NOTE — Telephone Encounter (Signed)
appt 10/16

## 2016-08-20 NOTE — Telephone Encounter (Signed)
Appt scheduled for 09/27/16 with pcp.Criss Alvine, Tiyanna Larcom Cassady9/8/20179:22 AM

## 2016-08-24 ENCOUNTER — Encounter: Payer: Self-pay | Admitting: Internal Medicine

## 2016-08-24 ENCOUNTER — Encounter: Payer: Medicaid Other | Attending: Physical Medicine & Rehabilitation | Admitting: Physical Medicine & Rehabilitation

## 2016-08-24 ENCOUNTER — Encounter: Payer: Self-pay | Admitting: Physical Medicine & Rehabilitation

## 2016-08-24 VITALS — BP 149/96 | HR 92 | Resp 14

## 2016-08-24 DIAGNOSIS — I5042 Chronic combined systolic (congestive) and diastolic (congestive) heart failure: Secondary | ICD-10-CM | POA: Diagnosis present

## 2016-08-24 DIAGNOSIS — G629 Polyneuropathy, unspecified: Secondary | ICD-10-CM | POA: Diagnosis present

## 2016-08-24 DIAGNOSIS — Z5181 Encounter for therapeutic drug level monitoring: Secondary | ICD-10-CM | POA: Diagnosis present

## 2016-08-24 DIAGNOSIS — M47817 Spondylosis without myelopathy or radiculopathy, lumbosacral region: Secondary | ICD-10-CM | POA: Diagnosis present

## 2016-08-24 DIAGNOSIS — G8114 Spastic hemiplegia affecting left nondominant side: Secondary | ICD-10-CM | POA: Diagnosis not present

## 2016-08-24 DIAGNOSIS — G8110 Spastic hemiplegia affecting unspecified side: Secondary | ICD-10-CM | POA: Diagnosis present

## 2016-08-24 DIAGNOSIS — E1342 Other specified diabetes mellitus with diabetic polyneuropathy: Secondary | ICD-10-CM | POA: Diagnosis present

## 2016-08-24 DIAGNOSIS — Z79899 Other long term (current) drug therapy: Secondary | ICD-10-CM | POA: Diagnosis present

## 2016-08-24 DIAGNOSIS — E1142 Type 2 diabetes mellitus with diabetic polyneuropathy: Secondary | ICD-10-CM | POA: Diagnosis not present

## 2016-08-24 DIAGNOSIS — I63319 Cerebral infarction due to thrombosis of unspecified middle cerebral artery: Secondary | ICD-10-CM | POA: Insufficient documentation

## 2016-08-24 DIAGNOSIS — I63311 Cerebral infarction due to thrombosis of right middle cerebral artery: Secondary | ICD-10-CM

## 2016-08-24 MED ORDER — OXYCODONE-ACETAMINOPHEN 10-325 MG PO TABS: 1.0000 | ORAL_TABLET | Freq: Three times a day (TID) | ORAL | 0 refills | Status: DC | PRN

## 2016-08-24 NOTE — Patient Instructions (Signed)
PLEASE CALL ME WITH ANY PROBLEMS OR QUESTIONS ([phone])  

## 2016-08-24 NOTE — Progress Notes (Signed)
Botox Injection for spasticity using needle EMG guidance Indication: Left spastic hemiparesis (HCC) -  Dilution: 100 Units/ml        Total Units Injected: 300 LUE Indication: Severe spasticity which interferes with ADL,mobility and/or  hygiene and is unresponsive to medication management and other conservative care Informed consent was obtained after describing risks and benefits of the procedure with the patient. This includes bleeding, bruising, infection, excessive weakness, or medication side effects. A REMS form is on file and signed.  Needle: 58mm injectable monopolar needle electrode  Number of units per muscle Pectoralis Major 0 units Pectoralis Minor 0 units Biceps 0 units Brachioradialis 0 units FCR 25 units FCU 25 units FDS 50 units FDP 50 units FPL 50 units Pronator Teres 0 units Pronator Quadratus 0 units Lumbricals 100 u in 4 locations  All injections were done after obtaining appropriate EMG activity and after negative drawback for blood. The patient tolerated the procedure well. Post procedure instructions were given. Return in about 2 months (around 10/24/2016).

## 2016-08-25 ENCOUNTER — Encounter (HOSPITAL_COMMUNITY): Payer: Medicaid Other

## 2016-08-26 ENCOUNTER — Telehealth: Payer: Self-pay | Admitting: *Deleted

## 2016-08-26 NOTE — Telephone Encounter (Signed)
Prior authorization submitted to [facility] for oxycodone-acetaminophen 10-325 mg

## 2016-08-27 ENCOUNTER — Encounter (HOSPITAL_COMMUNITY): Payer: Self-pay | Admitting: Emergency Medicine

## 2016-08-27 ENCOUNTER — Emergency Department (HOSPITAL_COMMUNITY): Payer: No Typology Code available for payment source

## 2016-08-27 ENCOUNTER — Emergency Department (HOSPITAL_COMMUNITY)
Admission: EM | Admit: 2016-08-27 | Discharge: 2016-08-27 | Disposition: A | Discharge status: Home / Self Care | Payer: No Typology Code available for payment source | Attending: Emergency Medicine | Admitting: Emergency Medicine

## 2016-08-27 DIAGNOSIS — Z7984 Long term (current) use of oral hypoglycemic drugs: Secondary | ICD-10-CM | POA: Diagnosis not present

## 2016-08-27 DIAGNOSIS — Z95810 Presence of automatic (implantable) cardiac defibrillator: Secondary | ICD-10-CM | POA: Diagnosis not present

## 2016-08-27 DIAGNOSIS — E1140 Type 2 diabetes mellitus with diabetic neuropathy, unspecified: Secondary | ICD-10-CM | POA: Insufficient documentation

## 2016-08-27 DIAGNOSIS — I5042 Chronic combined systolic (congestive) and diastolic (congestive) heart failure: Secondary | ICD-10-CM | POA: Diagnosis not present

## 2016-08-27 DIAGNOSIS — Y92410 Unspecified street and highway as the place of occurrence of the external cause: Secondary | ICD-10-CM | POA: Diagnosis not present

## 2016-08-27 DIAGNOSIS — Z8673 Personal history of transient ischemic attack (TIA), and cerebral infarction without residual deficits: Secondary | ICD-10-CM | POA: Insufficient documentation

## 2016-08-27 DIAGNOSIS — Z79899 Other long term (current) drug therapy: Secondary | ICD-10-CM | POA: Diagnosis not present

## 2016-08-27 DIAGNOSIS — Y939 Activity, unspecified: Secondary | ICD-10-CM | POA: Diagnosis not present

## 2016-08-27 DIAGNOSIS — S4992XA Unspecified injury of left shoulder and upper arm, initial encounter: Secondary | ICD-10-CM | POA: Diagnosis present

## 2016-08-27 DIAGNOSIS — Z794 Long term (current) use of insulin: Secondary | ICD-10-CM | POA: Diagnosis not present

## 2016-08-27 DIAGNOSIS — Z7982 Long term (current) use of aspirin: Secondary | ICD-10-CM | POA: Insufficient documentation

## 2016-08-27 DIAGNOSIS — I110 Hypertensive heart disease with heart failure: Secondary | ICD-10-CM | POA: Diagnosis not present

## 2016-08-27 DIAGNOSIS — S43402A Unspecified sprain of left shoulder joint, initial encounter: Secondary | ICD-10-CM

## 2016-08-27 DIAGNOSIS — Y999 Unspecified external cause status: Secondary | ICD-10-CM | POA: Diagnosis not present

## 2016-08-27 MED ORDER — HYDROMORPHONE HCL 1 MG/ML IJ SOLN
1.0000 mg | Freq: Once | INTRAMUSCULAR | Status: AC
  Administered 2016-08-27: 1 mg via INTRAMUSCULAR
  Filled 2016-08-27: qty 1

## 2016-08-27 MED ORDER — OXYCODONE-ACETAMINOPHEN 5-325 MG PO TABS: 1.0000 | ORAL_TABLET | ORAL | 0 refills | Status: DC | PRN
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
## 2016-08-27 NOTE — Discharge Instructions (Signed)
Please follow with your primary care doctor in the next 2 days for a check-up. They must obtain records for further management.  ° °Do not hesitate to return to the Emergency Department for any new, worsening or concerning symptoms.  ° °
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
## 2016-08-27 NOTE — ED Notes (Signed)
Patient transported to X-ray 

## 2016-09-03 ENCOUNTER — Ambulatory Visit: Payer: Medicaid Other | Admitting: Neurology

## 2016-09-08 ENCOUNTER — Ambulatory Visit (INDEPENDENT_AMBULATORY_CARE_PROVIDER_SITE_OTHER): Payer: Medicaid Other | Admitting: Sports Medicine

## 2016-09-08 ENCOUNTER — Encounter: Payer: Self-pay | Admitting: Sports Medicine

## 2016-09-08 ENCOUNTER — Encounter: Payer: Self-pay | Admitting: Neurology

## 2016-09-08 ENCOUNTER — Encounter: Payer: Self-pay | Admitting: *Deleted

## 2016-09-08 DIAGNOSIS — M25512 Pain in left shoulder: Secondary | ICD-10-CM | POA: Diagnosis not present

## 2016-09-08 NOTE — Progress Notes (Signed)
   Subjective:    I'm seeing this patient as a consultation for:  Dr. Antony Blackbird, DC  CC: Left shoulder pain  HPI: This is a very pleasant 50 year old female with a history of a right middle cerebral artery CVA 3 years ago with dense left-sided hemiparesis. She did have an x-ray that showed some subluxation of the glenohumeral joint. Unfortunately she continues to have pain that she localizes not at the glenohumeral joint but over the acromioclavicular joint. She has not yet had an overt dislocation requiring reduction. Pain is moderate, persistent and worse with bringing her arm across her chest. She has similar but lesser pain on the right side. Pain is not much better with over-the-counter analgesics and NSAIDs.  Past medical history:  Negative.  See flowsheet/record as well for more information.  Surgical history: Negative.  See flowsheet/record as well for more information.  Family history: Negative.  See flowsheet/record as well for more information.  Social history: Negative.  See flowsheet/record as well for more information.  Allergies, and medications have been entered into the medical record, reviewed, and no changes needed.   Review of Systems: No headache, visual changes, nausea, vomiting, diarrhea, constipation, dizziness, abdominal pain, skin rash, fevers, chills, night sweats, weight loss, swollen lymph nodes, body aches, joint swelling, muscle aches, chest pain, shortness of breath, mood changes, visual or auditory hallucinations.   Objective:   General: Well Developed, well nourished, and in no acute distress.  Neuro/Psych: Alert and oriented x3, extra-ocular muscles intact, able to move all 4 extremities, sensation grossly intact. Skin: Warm and dry, no rashes noted.  Respiratory: Not using accessory muscles, speaking in full sentences, trachea midline.  Cardiovascular: Pulses palpable, no extremity edema. Abdomen: Does not appear distended. Right shoulder: Tender to  palpation over the acromioclavicular joint with a positive cross arm sign, she has 0/5 strength, she has a negative crank test and negative apprehension test. Negative O'Brien's testing but difficult to ascertain due to her paralysis. I'm unable to dislocated the shoulder anteriorly with abduction and external rotation, I am able to sublux inferiorly, she does have a positive sulcus sign.  Procedure: Real-time Ultrasound Guided Injection of left acromioclavicular joint Device: GE Logiq E  Verbal informed consent obtained.  Time-out conducted.  Noted no overlying erythema, induration, or other signs of local infection.  Skin prepped in a sterile fashion.  Local anesthesia: Topical Ethyl chloride.  With sterile technique and under real time ultrasound guidance:  25-gauge needle advanced into joint and 1/2 mL kenalog 40, 1/2 mL lidocaine injected easily. Completed without difficulty  Pain immediately resolved suggesting accurate placement of the medication.  Advised to call if fevers/chills, erythema, induration, drainage, or persistent bleeding.  Images permanently stored and available for review in the ultrasound unit.  Impression: Technically successful ultrasound guided injection.  Impression and Recommendations:   This case required medical decision making of moderate complexity.  Left shoulder pain Post CVA with left-sided hemiparesis. She does have some left shoulder subluxation but no overt dislocation nor does she have a history of dislocation. Her pain is predominantly referable to the acromioclavicular joint, she really doesn't have much glenohumeral type pain. Ibuprofen has been ineffective for her pain. However she does have some degenerative changes here as well. Continue sling, I am going to inject her left acromioclavicular joint.  She should continue to work with physical therapy of her left shoulder. Return to see me in one month.

## 2016-09-08 NOTE — Assessment & Plan Note (Signed)
Post CVA with left-sided hemiparesis. She does have some left shoulder subluxation but no overt dislocation nor does she have a history of dislocation. Her pain is predominantly referable to the acromioclavicular joint, she really doesn't have much glenohumeral type pain. Ibuprofen has been ineffective for her pain. However she does have some degenerative changes here as well. Continue sling, I am going to inject her left acromioclavicular joint.  She should continue to work with physical therapy of her left shoulder. Return to see me in one month.

## 2016-09-09 ENCOUNTER — Telehealth: Payer: Self-pay | Admitting: Neurology

## 2016-09-09 NOTE — Telephone Encounter (Signed)
Patient dismissed from [facility] by Shon Millet DO , effective September 08, 2016. Dismissal letter sent out by certified / registered mail.  DAJ

## 2016-09-10 ENCOUNTER — Other Ambulatory Visit: Payer: Self-pay | Admitting: Internal Medicine

## 2016-09-10 ENCOUNTER — Ambulatory Visit (INDEPENDENT_AMBULATORY_CARE_PROVIDER_SITE_OTHER): Payer: Medicaid Other | Admitting: Internal Medicine

## 2016-09-10 VITALS — BP 155/87 | HR 84 | Temp 98.1°F | Ht 69.0 in | Wt 253.0 lb

## 2016-09-10 DIAGNOSIS — Z993 Dependence on wheelchair: Secondary | ICD-10-CM

## 2016-09-10 DIAGNOSIS — IMO0002 Reserved for concepts with insufficient information to code with codable children: Secondary | ICD-10-CM

## 2016-09-10 DIAGNOSIS — E1140 Type 2 diabetes mellitus with diabetic neuropathy, unspecified: Secondary | ICD-10-CM

## 2016-09-10 DIAGNOSIS — I69354 Hemiplegia and hemiparesis following cerebral infarction affecting left non-dominant side: Secondary | ICD-10-CM

## 2016-09-10 DIAGNOSIS — Z23 Encounter for immunization: Secondary | ICD-10-CM

## 2016-09-10 DIAGNOSIS — L97529 Non-pressure chronic ulcer of other part of left foot with unspecified severity: Secondary | ICD-10-CM

## 2016-09-10 DIAGNOSIS — Z794 Long term (current) use of insulin: Secondary | ICD-10-CM | POA: Diagnosis not present

## 2016-09-10 DIAGNOSIS — E1165 Type 2 diabetes mellitus with hyperglycemia: Secondary | ICD-10-CM

## 2016-09-10 DIAGNOSIS — N898 Other specified noninflammatory disorders of vagina: Secondary | ICD-10-CM | POA: Diagnosis present

## 2016-09-10 DIAGNOSIS — I69392 Facial weakness following cerebral infarction: Secondary | ICD-10-CM

## 2016-09-10 LAB — POCT GLYCOSYLATED HEMOGLOBIN (HGB A1C): HEMOGLOBIN A1C: 8.5

## 2016-09-10 LAB — GLUCOSE, CAPILLARY: GLUCOSE-CAPILLARY: 218 mg/dL — AB (ref 65–99)

## 2016-09-10 MED ORDER — GLUCOSE BLOOD VI STRP: ORAL_STRIP | 12 refills | Status: DC

## 2016-09-10 MED ORDER — ACCU-CHEK GUIDE W/DEVICE KIT: 1.0000 | PACK | Freq: Three times a day (TID) | 1 refills | Status: DC

## 2016-09-10 MED ORDER — METRONIDAZOLE 500 MG PO TABS: 500.0000 mg | ORAL_TABLET | Freq: Two times a day (BID) | ORAL | 0 refills | Status: AC

## 2016-09-10 MED ORDER — ACCU-CHEK FASTCLIX LANCETS MISC: 12 refills | Status: DC

## 2016-09-10 NOTE — Progress Notes (Signed)
CC: Vaginal odor  HPI:  [name] is a 50 y.o. female with PMHx detailed below presenting with 3 weeks of fishy-smelling vaginal odor, no itching, pain, or discharge.  See problem based assessment and plan below for additional details.  Past Medical History:  Diagnosis Date  . Allergic rhinitis   . Arthritis    "hips, back, legs, arms" (07/04/2014)  . Asthma    hx  . Automatic implantable cardioverter-defibrillator in situ   . Calcifying tendinitis of shoulder   . Chronic combined systolic and diastolic CHF (congestive heart failure) (HCC)    EF 40-45% by echo 12/06/2012  . Chronic diastolic heart failure (HCC)     Primarily diastolic CHF: Likely due to uncontrolled HTN. Last echo (8/12) with EF 45-50%, mild to moderate LVH with some asymmetric septal hypertrophy, RV normal size and systolic function. EF 50-55% by LV-gram in 6/12.   . Chronic lower back pain    secondary to DJD, obsetiy, hip problems. Followed by Dr. Ivory Broad (pain management)  . Coronary artery disease    questionable. LHC 05/2011 showing normal coronaries // Followed at [facility], Dr. Shirlee Latch  . Degeneration of lumbar or lumbosacral intervertebral disc   . DJD (degenerative joint disease) of hip    right sided  . Frequent UTI   . GERD (gastroesophageal reflux disease)   . HLD (hyperlipidemia)   . Hypertension    Poorly controlled. Has had HTN since age 50. Angioedema with ACEI.  24 Hr urine and renal arterial dopplers ordered . . . Never done  . LBBB (left bundle branch block)   . Liver disease   . Morbid obesity (HCC)   . NICM (nonischemic cardiomyopathy) (HCC)    EF 45-50% in 8/12, cath 6/12 showed normal coronaries, EF 50-55% by LV gram  . OSA on CPAP    sleep study in 8/12 showed moderate to severe OSA requiring CPAP  . Polyneuropathy in diabetes(357.2)   . Presence of permanent cardiac pacemaker   . Seizures (HCC)    last 3 months  . Shortness of breath    none now  .  Stroke [facility]) 12/2013   "my left side is paralyzed" (07/04/2014)  . Thoracic or lumbosacral neuritis or radiculitis, unspecified   . Type II diabetes mellitus (HCC) DX: 2002    Review of Systems: Review of Systems  Constitutional: Negative for chills, fever and malaise/fatigue.  Eyes: Positive for blurred vision.  Respiratory: Negative for shortness of breath.   Cardiovascular: Negative for chest pain and leg swelling.  Gastrointestinal: Negative for abdominal pain and nausea.  Genitourinary: Negative for dysuria, flank pain, frequency and urgency.  Skin: Negative for itching and rash.  Endo/Heme/Allergies: Negative for polydipsia.    Physical Exam: Vitals:   09/10/16 1018  BP: (!) 155/87  Pulse: 84  Temp: 98.1 F (36.7 C)  TempSrc: Oral  SpO2: 97%  Weight: 253 lb (114.8 kg)  Height: 5\' 9"  (1.753 m)   Body mass index is 37.36 kg/m. GENERAL- Obese woman sitting comfortably in wheelchair wrapped in blanket, alert, in no distress HEENT- Atraumatic, moist mucous membranes CARDIAC- Regular rate and rhythm, no murmurs, rubs or gallops. RESP- Clear to ascultation bilaterally, no wheezing or crackles, normal work of breathing ABDOMEN- Soft, nontender, nondistended NEURO- Alert and oriented, left facial droop, 0/5 LUE strength, 3/5 LLE strength from previous CVA EXTREMITIES- No edema, 2+ peripheral pulses SKIN- Warm, dry, intact, without visible rash PSYCH- Appropriate affect, clear speech, thoughts linear and goal-directed  Assessment & Plan:   See encounters tab for problem based medical decision making.  Patient seen with Dr. Rogelia Boga

## 2016-09-10 NOTE — Assessment & Plan Note (Signed)
Lost glucometer 1 month ago, unsure what her sugars have been running. Denies polyuria, polydipsia, endorses blurry vision. POC HbA1c 8.5 and CBG 218.  Provided new glucometer device, re-ordered lancets, testing strips

## 2016-09-10 NOTE — Patient Instructions (Addendum)
It is very likely that the odor you have noticed is from a mild infection also known as bacterial vaginosis.  For this infection we have prescribed Metronidazole (Flagyl) 500mg  twice daily for 7 days.  We will also provide you with a replacement glucometer and a prescription for test strips today. Please make sure to check you blood sugar at least twice daily and bring your glucometer to your next visit.

## 2016-09-10 NOTE — Assessment & Plan Note (Addendum)
Endorses 3 weeks of new fishy vaginal odor that she notices after taking a bath. Denies itching, pain, dysuria, frequency, discharge, no recent antibiotic use, no new soaps or sexual partners, no fevers/chills or abdominal pain. Pelvic exam deferred given patient wheelchair bound with hemiparesis 2/2 CVA - clinical history makes BV the most likely diagnosis.  Plan:  - Metronidazole 500mg  BID for 7 days - If odor persisting or develops discharge of bleeding return to clinic for pelvic exam - No documented pap smears in the past, should undergo this at next PCP visit to screen for cervical cancer

## 2016-09-13 NOTE — Progress Notes (Signed)
Internal Medicine Clinic Attending  I saw and evaluated the patient.  I personally confirmed the key portions of the history and exam documented by Dr. Johnson and I reviewed pertinent patient test results.  The assessment, diagnosis, and plan were formulated together and I agree with the documentation in the resident's note.  

## 2016-09-14 ENCOUNTER — Telehealth: Payer: Self-pay | Admitting: Physical Medicine & Rehabilitation

## 2016-09-14 NOTE — Telephone Encounter (Signed)
Patient will need prior authorization on her percocet and she gets it filled on 10/9.  Please call her when this is done.

## 2016-09-15 NOTE — Telephone Encounter (Signed)
Informed patient that prior authorization was approved by [facility] on 08/29/2016. I also counseled the patient that we discovered she had received a paper script from the ED for percocet 5-325 mg and had it filled.  I informed her that it is written in her medication contract that she is to get pain medication from one source.  I informed that she can receive treatment at the ED or urgent care, but  If any scripts are given to her for pain, she needs to contact us for approval

## 2016-09-16 ENCOUNTER — Other Ambulatory Visit: Payer: Self-pay | Admitting: Physical Medicine & Rehabilitation

## 2016-09-16 ENCOUNTER — Other Ambulatory Visit (HOSPITAL_COMMUNITY): Payer: Self-pay | Admitting: Adult Health

## 2016-09-16 ENCOUNTER — Other Ambulatory Visit: Payer: Self-pay | Admitting: Internal Medicine

## 2016-09-16 ENCOUNTER — Other Ambulatory Visit: Payer: Self-pay | Admitting: Cardiology

## 2016-09-16 ENCOUNTER — Telehealth: Payer: Self-pay | Admitting: Physical Medicine & Rehabilitation

## 2016-09-16 DIAGNOSIS — I10 Essential (primary) hypertension: Secondary | ICD-10-CM

## 2016-09-16 DIAGNOSIS — M1712 Unilateral primary osteoarthritis, left knee: Secondary | ICD-10-CM

## 2016-09-16 DIAGNOSIS — I5042 Chronic combined systolic (congestive) and diastolic (congestive) heart failure: Secondary | ICD-10-CM

## 2016-09-16 DIAGNOSIS — M47896 Other spondylosis, lumbar region: Secondary | ICD-10-CM

## 2016-09-16 NOTE — Telephone Encounter (Signed)
Patient went to an appointment at [facility] yesterday and Thayer Ohm there needs a prescription from Dr. Riley Kill for a customized brace for patient's leg.  Any questions please call [facility].  Please call patient when this is done.

## 2016-09-16 NOTE — Telephone Encounter (Signed)
Received signed domestic return receipt verifying delivery of certified letter on September 13, 2016. Article number [id number]

## 2016-09-16 NOTE — Progress Notes (Signed)
Thank you :)

## 2016-09-17 NOTE — Telephone Encounter (Signed)
If someone could prepare the script, i'll sign Monday.  Thanks

## 2016-09-20 ENCOUNTER — Telehealth: Payer: Self-pay | Admitting: Physical Medicine & Rehabilitation

## 2016-09-20 NOTE — Telephone Encounter (Signed)
Script filled out, awaiting signature on Dr. Rosalyn Charters desk

## 2016-09-20 NOTE — Telephone Encounter (Signed)
[facility] called back with more detail, script is filled out, awaiting signature

## 2016-09-20 NOTE — Telephone Encounter (Signed)
Contacted [facility] for more detail.  [name] said she would need to speak with [name] to get more info,is it right or left, which type, etc..Marland KitchenMarland KitchenThe patient has braces on both feet. She needs to clarify for eligibility as one of the braces was recently acquired.  Will call back

## 2016-09-20 NOTE — Telephone Encounter (Signed)
Shamika with [facility] is calling Rocky Link back about a prescription for patient.  The prescription should read Right Custom AFO.  Any questions call Shamika at [phone] and the [facility] clinic fax number is [phone].

## 2016-09-21 NOTE — Telephone Encounter (Signed)
Script signed and faxed to [facility]

## 2016-09-24 NOTE — Assessment & Plan Note (Deleted)
Seen on 9/29 for fishy vaginal odor without pain, discharge, or pruritis.  Pelvic exam was deferred at that time due to hemiparesis, and she was treated empirically for BV with 7 days of metronidazole.

## 2016-09-24 NOTE — Assessment & Plan Note (Deleted)
10/2015 echocardiogram showed EF 25-30% with diffuse hypokinesis  Current medications: digoxin 0.125 mg daily, metoprolol succinate 100 mg BID, furosemide 40 mg 2x per week, spironolactone 50 mg daily, isosorbide mononitrate 90mg  daily   Volume status today  -Continue current meds

## 2016-09-24 NOTE — Assessment & Plan Note (Deleted)
BP Readings from Last 3 Encounters:  09/10/16 (!) 155/87  09/08/16 (!) 149/90  08/27/16 145/90   Lab Results  Component Value Date   CREATININE 0.90 08/11/2016   Lab Results  Component Value Date   K 4.5 08/11/2016    Current medications: amlodipine 10 mg daily, hydralazine 50 mg TID, isosorbide mononitrate 90 mg daily, lasix 40 mg 2x per week, spironolactone 50 mg daily  Assessment BP goal: 140/90 BP control: not at goal  Plan Medications: Other:

## 2016-09-24 NOTE — Assessment & Plan Note (Deleted)
Lab Results  Component Value Date   HGBA1C 8.5 09/10/2016   HGBA1C 8.1 04/29/2016   HGBA1C 8.6 (H) 12/23/2015   Current medications: metformin 1000 mg BID Current insulin: lantus 45U QHS   Assessment HgbA1c goal: 7.0 Blood Sugar control: poorly controlled  Plan Medications: continue current meds Other:

## 2016-09-24 NOTE — Progress Notes (Deleted)
   CC: ***  HPI:  [name] is a 50 y.o. with history of HTN, CHF, DM2, and stroke (with residual L sided hemiparesis and spasticity) who presents for management of hypertension.  Please see A&P for status of the patient's chronic medical conditions.  She was last seen in clinic on 9/29 with concern for fishy vaginal odor without pain, discharge, or pruritis.  She was empirically treated for BV with 7 days of metronidazole.  She had a steroid injection of L AC joint on 9/27 for shoulder pain by [facility].  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  Past Medical History:  Diagnosis Date  . Allergic rhinitis   . Arthritis    "hips, back, legs, arms" (07/04/2014)  . Asthma    hx  . Automatic implantable cardioverter-defibrillator in situ   . Calcifying tendinitis of shoulder   . Chronic combined systolic and diastolic CHF (congestive heart failure) (HCC)    EF 40-45% by echo 12/06/2012  . Chronic diastolic heart failure (HCC)     Primarily diastolic CHF: Likely due to uncontrolled HTN. Last echo (8/12) with EF 45-50%, mild to moderate LVH with some asymmetric septal hypertrophy, RV normal size and systolic function. EF 50-55% by LV-gram in 6/12.   . Chronic lower back pain    secondary to DJD, obsetiy, hip problems. Followed by Dr. Ivory Broad (pain management)  . Coronary artery disease    questionable. LHC 05/2011 showing normal coronaries // Followed at [facility], Dr. Shirlee Latch  . Degeneration of lumbar or lumbosacral intervertebral disc   . DJD (degenerative joint disease) of hip    right sided  . Frequent UTI   . GERD (gastroesophageal reflux disease)   . HLD (hyperlipidemia)   . Hypertension    Poorly controlled. Has had HTN since age 7. Angioedema with ACEI.  24 Hr urine and renal arterial dopplers ordered . . . Never done  . LBBB (left bundle  branch block)   . Liver disease   . Morbid obesity (HCC)   . NICM (nonischemic cardiomyopathy) (HCC)    EF 45-50% in 8/12, cath 6/12 showed normal coronaries, EF 50-55% by LV gram  . OSA on CPAP    sleep study in 8/12 showed moderate to severe OSA requiring CPAP  . Polyneuropathy in diabetes(357.2)   . Presence of permanent cardiac pacemaker   . Seizures (HCC)    last 3 months  . Shortness of breath    none now  . Stroke [facility]) 12/2013   "my left side is paralyzed" (07/04/2014)  . Thoracic or lumbosacral neuritis or radiculitis, unspecified   . Type II diabetes mellitus (HCC) DX: 2002    Review of Systems:  ROS   Physical Exam:  There were no vitals filed for this visit.  Physical Exam  Assessment & Plan:   See Encounters Tab for problem based charting.  Patient {GC/GE:3044014::"discussed with","seen with"} Dr. {NAMES:3044014::"Butcher","Granfortuna","E. Hoffman","Klima","Mullen","Narendra","Vincent"}

## 2016-09-27 ENCOUNTER — Encounter: Payer: Medicaid Other | Admitting: Internal Medicine

## 2016-09-27 NOTE — Assessment & Plan Note (Deleted)
Depression screen [facility] [address] 2/9 09/10/2016 07/28/2016 07/14/2016 04/29/2016 03/05/2016  Decreased Interest 0 0 0 0 0  Down, Depressed, Hopeless 0 0 - 0 0  PHQ - 2 Score 0 0 0 0 0  Altered sleeping - - - - -  Tired, decreased energy - - - - -  Change in appetite - - - - -  Feeling bad or failure about yourself  - - - - -  Trouble concentrating - - - - -  Moving slowly or fidgety/restless - - - - -  Suicidal thoughts - - - - -  PHQ-9 Score - - - - -  Some recent data might be hidden    Current medications: Citalopram 20 mg daily  Previous medications:  Assessment   Plan  Medications: continue current meds  Other:

## 2016-09-27 NOTE — Assessment & Plan Note (Deleted)
Flu shot

## 2016-09-30 ENCOUNTER — Telehealth: Payer: Self-pay | Admitting: Licensed Clinical Social Worker
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
## 2016-10-01 ENCOUNTER — Ambulatory Visit (INDEPENDENT_AMBULATORY_CARE_PROVIDER_SITE_OTHER): Payer: Medicaid Other | Admitting: Orthopedic Surgery

## 2016-10-04 ENCOUNTER — Ambulatory Visit (INDEPENDENT_AMBULATORY_CARE_PROVIDER_SITE_OTHER): Payer: Medicaid Other | Admitting: Orthopedic Surgery

## 2016-10-06 ENCOUNTER — Ambulatory Visit (INDEPENDENT_AMBULATORY_CARE_PROVIDER_SITE_OTHER): Payer: Medicaid Other | Admitting: Sports Medicine

## 2016-10-06 ENCOUNTER — Encounter: Payer: Self-pay | Admitting: Sports Medicine

## 2016-10-06 DIAGNOSIS — M25512 Pain in left shoulder: Secondary | ICD-10-CM

## 2016-10-06 DIAGNOSIS — G8929 Other chronic pain: Secondary | ICD-10-CM

## 2016-10-06 NOTE — Assessment & Plan Note (Signed)
Did well after acromioclavicular injection at the last visit, still has mild pain referable to the glenohumeral and subacromial structures. These 2 structures were injected today, return in one month.

## 2016-10-06 NOTE — Progress Notes (Signed)
  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 She still has minor pain that she localizes over the joint lines. Pain does not radiate. Worse with overhead activities.  Past medical history:  Negative.  See flowsheet/record as well for more information.  Surgical history: Negative.  See flowsheet/record as well for more information.  Family history: Negative.  See flowsheet/record as well for more information.  Social history: Negative.  See flowsheet/record as well for more information.  Allergies, and medications have been entered into the medical record, reviewed, and no changes needed.   Review of Systems: No fevers, chills, night sweats, weight loss, chest pain, or shortness of breath.   Objective:    General: Well Developed, well nourished, and in no acute distress.  Neuro: Alert and oriented x3, extra-ocular muscles intact, sensation grossly intact.  HEENT: Normocephalic, atraumatic, pupils equal round reactive to light, neck supple, no masses, no lymphadenopathy, thyroid nonpalpable.  Skin: Warm and dry, no rashes. Cardiac: Regular rate and rhythm, no murmurs rubs or gallops, no lower extremity edema.  Respiratory: Clear to auscultation bilaterally. Not using accessory muscles, speaking in full sentences. Left shoulder: Paralyzed, 0/5 strength. No pain over the acromial clavicular joint with a negative cross arm sign. She does have some pain with abduction and external rotation as well as a positive Hawkins and Neer signs.  Procedure: Real-time Ultrasound Guided Injection of left glenohumeral joint Device: GE Logiq E  Verbal informed consent obtained.  Time-out conducted.  Noted no overlying erythema, induration, or other signs of local infection.  Skin prepped in a sterile  fashion.  Local anesthesia: Topical Ethyl chloride.  With sterile technique and under real time ultrasound guidance:  1 mL kenalog 40, 1 mL lidocaine, 1 mL Marcaine injected easily. Completed without difficulty  Pain immediately resolved suggesting accurate placement of the medication.  Advised to call if fevers/chills, erythema, induration, drainage, or persistent bleeding.  Images permanently stored and available for review in the ultrasound unit.  Impression: Technically successful ultrasound guided injection.  Procedure: Real-time Ultrasound Guided Injection of left subacromial bursa Device: GE Logiq E  Verbal informed consent obtained.  Time-out conducted.  Noted no overlying erythema, induration, or other signs of local infection.  Skin prepped in a sterile fashion.  Local anesthesia: Topical Ethyl chloride.  With sterile technique and under real time ultrasound guidance:  1 mL kenalog 40, 1 mL lidocaine, 1 mL Marcaine injected easily. Completed without difficulty  Pain immediately resolved suggesting accurate placement of the medication.  Advised to call if fevers/chills, erythema, induration, drainage, or persistent bleeding.  Images permanently stored and available for review in the ultrasound unit.  Impression: Technically successful ultrasound guided injection.  Impression and Recommendations:    Left shoulder pain Did well after acromioclavicular injection at the last visit, still has mild pain referable to the glenohumeral and subacromial structures. These 2 structures were injected today, return in one month.

## 2016-10-12 ENCOUNTER — Telehealth: Payer: Self-pay | Admitting: Licensed Clinical Social Worker

## 2016-10-18 ENCOUNTER — Ambulatory Visit (INDEPENDENT_AMBULATORY_CARE_PROVIDER_SITE_OTHER): Payer: Medicaid Other | Admitting: Orthopaedic Surgery

## 2016-10-18 NOTE — Addendum Note (Signed)
Addended by: Neomia Dear on: 10/18/2016 09:22 AM   Modules accepted: Orders

## 2016-10-19 ENCOUNTER — Telehealth: Payer: Self-pay | Admitting: Licensed Clinical Social Worker

## 2016-10-19 NOTE — Telephone Encounter (Signed)
SW reached out to pt for wellness check. Pt did not answer the phone. SW left message and indicated that we would reach out again at a later time. [name], Social Work Intern [place], [place] [phone]

## 2016-10-19 NOTE — Telephone Encounter (Signed)
Opened in error

## 2016-10-20 ENCOUNTER — Inpatient Hospital Stay (HOSPITAL_COMMUNITY): Admission: RE | Admit: 2016-10-20 | Payer: Medicaid Other | Source: Ambulatory Visit

## 2016-10-25 ENCOUNTER — Other Ambulatory Visit: Payer: Self-pay | Admitting: Internal Medicine

## 2016-10-25 ENCOUNTER — Encounter: Payer: Medicaid Other | Attending: Physical Medicine & Rehabilitation | Admitting: Registered Nurse

## 2016-10-25 ENCOUNTER — Other Ambulatory Visit: Payer: Self-pay | Admitting: Physical Medicine & Rehabilitation

## 2016-10-25 ENCOUNTER — Encounter: Payer: Self-pay | Admitting: Registered Nurse

## 2016-10-25 VITALS — BP 173/110 | HR 85

## 2016-10-25 DIAGNOSIS — I63319 Cerebral infarction due to thrombosis of unspecified middle cerebral artery: Secondary | ICD-10-CM | POA: Insufficient documentation

## 2016-10-25 DIAGNOSIS — Z5181 Encounter for therapeutic drug level monitoring: Secondary | ICD-10-CM | POA: Diagnosis present

## 2016-10-25 DIAGNOSIS — I69354 Hemiplegia and hemiparesis following cerebral infarction affecting left non-dominant side: Secondary | ICD-10-CM

## 2016-10-25 DIAGNOSIS — E1342 Other specified diabetes mellitus with diabetic polyneuropathy: Secondary | ICD-10-CM | POA: Diagnosis present

## 2016-10-25 DIAGNOSIS — G8114 Spastic hemiplegia affecting left nondominant side: Secondary | ICD-10-CM

## 2016-10-25 DIAGNOSIS — E1165 Type 2 diabetes mellitus with hyperglycemia: Secondary | ICD-10-CM

## 2016-10-25 DIAGNOSIS — I63311 Cerebral infarction due to thrombosis of right middle cerebral artery: Secondary | ICD-10-CM | POA: Diagnosis not present

## 2016-10-25 DIAGNOSIS — I5042 Chronic combined systolic (congestive) and diastolic (congestive) heart failure: Secondary | ICD-10-CM | POA: Insufficient documentation

## 2016-10-25 DIAGNOSIS — M47817 Spondylosis without myelopathy or radiculopathy, lumbosacral region: Secondary | ICD-10-CM | POA: Insufficient documentation

## 2016-10-25 DIAGNOSIS — I10 Essential (primary) hypertension: Secondary | ICD-10-CM

## 2016-10-25 DIAGNOSIS — E1140 Type 2 diabetes mellitus with diabetic neuropathy, unspecified: Secondary | ICD-10-CM

## 2016-10-25 DIAGNOSIS — E1142 Type 2 diabetes mellitus with diabetic polyneuropathy: Secondary | ICD-10-CM | POA: Diagnosis not present

## 2016-10-25 DIAGNOSIS — G629 Polyneuropathy, unspecified: Secondary | ICD-10-CM | POA: Insufficient documentation

## 2016-10-25 DIAGNOSIS — G8110 Spastic hemiplegia affecting unspecified side: Secondary | ICD-10-CM | POA: Diagnosis present

## 2016-10-25 DIAGNOSIS — Z79899 Other long term (current) drug therapy: Secondary | ICD-10-CM | POA: Diagnosis present

## 2016-10-25 DIAGNOSIS — IMO0002 Reserved for concepts with insufficient information to code with codable children: Secondary | ICD-10-CM

## 2016-10-25 DIAGNOSIS — G894 Chronic pain syndrome: Secondary | ICD-10-CM

## 2016-10-25 MED ORDER — OXYCODONE-ACETAMINOPHEN 10-325 MG PO TABS: 1.0000 | ORAL_TABLET | Freq: Three times a day (TID) | ORAL | 0 refills | Status: DC | PRN
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
## 2016-10-26 ENCOUNTER — Encounter (HOSPITAL_COMMUNITY): Payer: Self-pay

## 2016-10-26 ENCOUNTER — Ambulatory Visit (HOSPITAL_COMMUNITY)
Admission: RE | Admit: 2016-10-26 | Discharge: 2016-10-26 | Disposition: A | Discharge status: Home / Self Care | Payer: Medicaid Other | Source: Ambulatory Visit | Attending: Cardiology | Admitting: Cardiology

## 2016-10-26 VITALS — BP 154/96 | HR 82 | Wt 246.0 lb

## 2016-10-26 DIAGNOSIS — I5042 Chronic combined systolic (congestive) and diastolic (congestive) heart failure: Secondary | ICD-10-CM

## 2016-10-26 DIAGNOSIS — I110 Hypertensive heart disease with heart failure: Secondary | ICD-10-CM | POA: Diagnosis not present

## 2016-10-26 DIAGNOSIS — Z8673 Personal history of transient ischemic attack (TIA), and cerebral infarction without residual deficits: Secondary | ICD-10-CM | POA: Diagnosis not present

## 2016-10-26 DIAGNOSIS — I2510 Atherosclerotic heart disease of native coronary artery without angina pectoris: Secondary | ICD-10-CM | POA: Diagnosis not present

## 2016-10-26 DIAGNOSIS — Z794 Long term (current) use of insulin: Secondary | ICD-10-CM | POA: Insufficient documentation

## 2016-10-26 DIAGNOSIS — Z7982 Long term (current) use of aspirin: Secondary | ICD-10-CM | POA: Diagnosis not present

## 2016-10-26 DIAGNOSIS — I428 Other cardiomyopathies: Secondary | ICD-10-CM | POA: Insufficient documentation

## 2016-10-26 DIAGNOSIS — E785 Hyperlipidemia, unspecified: Secondary | ICD-10-CM | POA: Diagnosis not present

## 2016-10-26 DIAGNOSIS — Z95810 Presence of automatic (implantable) cardiac defibrillator: Secondary | ICD-10-CM | POA: Diagnosis not present

## 2016-10-26 DIAGNOSIS — E119 Type 2 diabetes mellitus without complications: Secondary | ICD-10-CM | POA: Diagnosis not present

## 2016-10-26 DIAGNOSIS — Z79899 Other long term (current) drug therapy: Secondary | ICD-10-CM | POA: Diagnosis not present

## 2016-10-26 DIAGNOSIS — I10 Essential (primary) hypertension: Secondary | ICD-10-CM

## 2016-10-26 DIAGNOSIS — I5022 Chronic systolic (congestive) heart failure: Secondary | ICD-10-CM | POA: Diagnosis present

## 2016-10-26 DIAGNOSIS — I69354 Hemiplegia and hemiparesis following cerebral infarction affecting left non-dominant side: Secondary | ICD-10-CM

## 2016-10-26 DIAGNOSIS — G4733 Obstructive sleep apnea (adult) (pediatric): Secondary | ICD-10-CM

## 2016-10-26 LAB — BASIC METABOLIC PANEL
ANION GAP: 8 (ref 5–15)
BUN: 7 mg/dL (ref 6–20)
CO2: 29 mmol/L (ref 22–32)
Calcium: 9.5 mg/dL (ref 8.9–10.3)
Chloride: 101 mmol/L (ref 101–111)
Creatinine, Ser: 0.76 mg/dL (ref 0.44–1.00)
GFR calc Af Amer: >60 mL/min (ref >60)
Glucose, Bld: 138 mg/dL — ABNORMAL HIGH (ref 65–99)
POTASSIUM: 3.8 mmol/L (ref 3.5–5.1)
SODIUM: 138 mmol/L (ref 135–145)

## 2016-10-26 MED ORDER — HYDRALAZINE HCL 50 MG PO TABS: 75.0000 mg | ORAL_TABLET | Freq: Three times a day (TID) | ORAL | 2 refills | Status: DC

## 2016-10-26 NOTE — Progress Notes (Signed)
Patient ID: [name], female   DOB: Jan 03, 1966, 50 y.o.   MRN: [id number]    Advanced Heart Failure Clinic Note   EP: Dr Caryl Comes PCP: Dr [facility] Primary HF Cardiologist: Dr Aundra Dubin   Angioedema with ACE-I  --Optivol--  HPI: 50 yo with history of morbid obesity, HTN, HLD, CAD, DM2, OSA on CPAP, CVA and combined CHF in the setting of poorly-controlled HTN. She was admitted in 05/2011 with chest pain and new LBBB and ended up getting a left heart cath, which showed no angiographic CAD. Renal artery dopplers were normal in 04/2012.  She has a Medtronic CRT-D device.   Was hospitalized May 2014 with acute on chronic combined systolic and diastolic CHF. EF from December 2013 was down to 40 to 45%.   Admitted in January 2015 with slurred speech, L arm weakness and facial droop. Initial head CT showed no acute abnormality. Repeat head CT showed subtle gray white matter differentiation in R frontal lobe and R operculum perhaps representing ischemic R sided MCA. STAT repeat CT head later on 1/29 showed further evolution of R MCA territory infarct involving R insular cortex and operculum. No hemorrhage. Neurology recommended CTA head/neck, which showed occlusion of MCA branch to the area of R MCA infarct and a normal neck CTA. TEE was done and did not reveal embolic source. Was discharged to inpatient rehab.  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  She was started on Keppra.  She went to the ER in 2/16 with syncope that was probably due to seizure, no arrhythmias with device interrogation.  Echo in 11/16 showed EF back down to 25-30%.    She presents today for follow up. Weight down 9 lbs from last visit, but also wearing leg brace and dressed warmly. Was supposed to come back in 2 weeks, has been > 2 months. Breathing is good. Has had some anxiety.  No further CP. Hasn't needed lasix in the past 3 weeks. Now on vesicare and having some  incontinence. Has been working with chiropractor, this is her last week.  Also seeing physical medicine, but not doing rehab. Uses CPAP most night. Occasionally misses some of her medicines. Not weighing daily.   Optivol: Fluid below threshold. One episode of SVT. No AF. Pt activity < 1 hr daily.   ECHO 11/2012: 40-45% ECHO  07/2013: 20% RV normal ECHO    08/23/2013: EF 40-45%, diff HK ECHO   09/05/2013: EF 25-30%, diff HK  ECHO  12/11/13: EF 30%, diff HK, RV normal ECHO 01/14/2014: EF 35-40%, LA mod dilated  ECHO 4/16: EF 40-45%, moderate LVH, normal RV size with mildly decreased systolic function.   ECHO 11/16: EF 25-30%, moderate LVH  Labs 06/2013: proBNP 230, Cr 0.9, K 3.7          08/2013: K+ 3.9, Cr 0.9       09/2013: K 4.2, Cr 0.86, pro-BNP 718, dig level <0.3     10/07/13: K+ 4.1, Cr 0.83       05/08/14: K 4.0 Creatinine 0.67 Pro BNP 233 Dig level 0.3        08/20/2014 K 3.8 nCreatinine 0.56        2/16: K 3.6, creatinine 0.95, LDL 106, HDL 43       3/16: K 3.5, creatinine 0.87, HCT 35.9       7/16: K 3.5, creatinine 0.78. HCT 37       10/16:  digoxin 0.2, LDL 116       1/17: K 3.6, creatinine 0.77, HCT 38       5/17: digoxin 0.9       8/17: K 4.2, creatinine 0.68, HCT 35.7  ROS: All systems negative except as listed in HPI, PMH and Problem List.  Past Medical History:  Diagnosis Date  . Allergic rhinitis   . Arthritis    "hips, back, legs, arms" (07/04/2014)  . Asthma    hx  . Automatic implantable cardioverter-defibrillator in situ   . Calcifying tendinitis of shoulder   . Chronic combined systolic and diastolic CHF (congestive heart failure) ([place])    EF 40-45% by echo 12/06/2012  . Chronic diastolic heart failure (HCC)     Primarily diastolic CHF: Likely due to uncontrolled HTN. Last echo (8/12) with EF 45-50%, mild to moderate LVH with some asymmetric septal hypertrophy, RV normal size and systolic function. EF 50-55% by LV-gram in 6/12.   . Chronic lower back pain     secondary to DJD, obsetiy, hip problems. Followed by Dr. Oval Linsey (pain management)  . Coronary artery disease    questionable. LHC 05/2011 showing normal coronaries // Followed at [facility], Dr. Aundra Dubin  . Degeneration of lumbar or lumbosacral intervertebral disc   . DJD (degenerative joint disease) of hip    right sided  . Frequent UTI   . GERD (gastroesophageal reflux disease)   . HLD (hyperlipidemia)   . Hypertension    Poorly controlled. Has had HTN since age 42. Angioedema with ACEI.  24 Hr urine and renal arterial dopplers ordered . . . Never done  . LBBB (left bundle branch block)   . Liver disease   . Morbid obesity ([place])   . NICM (nonischemic cardiomyopathy) ([place])    EF 45-50% in 8/12, cath 6/12 showed normal coronaries, EF 50-55% by LV gram  . OSA on CPAP    sleep study in 8/12 showed moderate to severe OSA requiring CPAP  . Polyneuropathy in diabetes(357.2)   . Presence of permanent cardiac pacemaker   . Seizures ([place])    last 3 months  . Shortness of breath    none now  . Stroke [facility]) 12/2013   "my left side is paralyzed" (07/04/2014)  . Thoracic or lumbosacral neuritis or radiculitis, unspecified   . Type II diabetes mellitus ([place]) DX: 2002    Current Outpatient Prescriptions  Medication Sig Dispense Refill  . ACCU-CHEK FASTCLIX LANCETS MISC Check blood sugar 3 times a day 102 each 12  . amLODipine (NORVASC) 10 MG tablet TAKE 1 TABLET BY MOUTH EVERY DAY 30 tablet 6  . aspirin 81 MG tablet Take 1 tablet (81 mg total) by mouth daily. 90 tablet 6  . atorvastatin (LIPITOR) 40 MG tablet TAKE 1 TABLET BY MOUTH EVERY DAY 30 tablet 3  . Blood Glucose Monitoring Suppl (ACCU-CHEK GUIDE) w/Device KIT 1 each by Does not apply route 3 (three) times daily. 1 kit 1  . calamine lotion Apply 1 application topically as needed for itching. 120 mL 0  . citalopram (CELEXA) 20 MG tablet TAKE 1 TABLET BY MOUTH EVERY DAY 90 tablet 1  . cyclobenzaprine (FLEXERIL) 10 MG tablet  TAKE ONE TABLET BY MOUTH 3 TIMES A DAY IF NEEDED FOR MUSCLE SPASMS 90 tablet 1  . digoxin (LANOXIN) 0.125 MG tablet Take 1 tablet (0.125 mg total) by mouth daily. 30 tablet 5  . docusate sodium (COLACE) 50 MG capsule Take 1 capsule (50 mg total)  by mouth 2 (two) times daily as needed for mild constipation. 60 capsule 6  . fluticasone (CUTIVATE) 0.05 % cream Apply topically 2 (two) times daily. Dispense two tubes (Patient taking differently: Apply 1 application topically 2 (two) times daily. ) 30 g 6  . glucose blood (ACCU-CHEK GUIDE) test strip Check blood sugar 3 times a day before meals 100 each 12  . hydrALAZINE (APRESOLINE) 50 MG tablet TAKE ONE TABLET BY MOUTH 3 TIMES A DAY 270 tablet 1  . Insulin Glargine (LANTUS SOLOSTAR) 100 UNIT/ML Solostar Pen Inject 45 Units into the skin daily at 10 pm. Inject 45 units in the PM. 45 mL 3  . ipratropium (ATROVENT) 0.06 % nasal spray Place 2 sprays into both nostrils 4 (four) times daily. 15 mL 1  . isosorbide mononitrate (IMDUR) 60 MG 24 hr tablet TAKE 1 & 1/2 TABLETS BY MOUTH EVERY DAY 45 tablet 3  . levETIRAcetam (KEPPRA) 500 MG tablet TAKE 1 TABLET BY MOUTH TWO TIMES DAILY **MUST NOT MISS ANY DOSE** 180 tablet 1  . metFORMIN (GLUCOPHAGE) 1000 MG tablet TAKE 1 TABLET BY MOUTH TWO TIMES DAILY WITH A MEAL 180 tablet 1  . metoprolol succinate (TOPROL-XL) 100 MG 24 hr tablet TAKE 1 TABLET BY MOUTH TWO TIMES DAILY 60 tablet 3  . mupirocin cream (BACTROBAN) 2 % Apply 1 application topically 2 (two) times daily. 15 g 2  . naproxen (NAPROSYN) 500 MG tablet Take 1 tablet (500 mg total) by mouth 2 (two) times daily. 30 tablet 0  . nystatin cream (MYCOSTATIN) APPLY UNDER ABDOMINAL SKIN AND ON BUTTOCKS 2 TIMES A DAY UNTIL RASH RESOLVES 30 g 0  . ondansetron (ZOFRAN ODT) 8 MG disintegrating tablet Take 1 tablet (8 mg total) by mouth every 8 (eight) hours as needed for nausea or vomiting. 10 tablet 0  . oxyCODONE-acetaminophen (PERCOCET) 10-325 MG tablet Take 1 tablet  by mouth every 8 (eight) hours as needed for pain. 90 tablet 0  . pantoprazole (PROTONIX) 40 MG tablet Take 40 mg by mouth daily.    . polyethylene glycol (MIRALAX / GLYCOLAX) packet MIX 1 PACKET IN 8OZ OF LIQUID EVERY DAY 14 each 0  . solifenacin (VESICARE) 10 MG tablet Take 10 mg by mouth daily.    [name] spironolactone (ALDACTONE) 50 MG tablet TAKE ONE TABLET BY MOUTH EVERY DAY 90 tablet 1  . triamcinolone (KENALOG) 0.025 % ointment APPLY TOPICALLY TO LEFT ARM 2 TIMES A DAY UNTIL RASH AND ITCHING RESOLVES 30 g 0  . Vitamin D, Ergocalciferol, (DRISDOL) 50000 units CAPS capsule TAKE 1 CAPSULE BY MOUTH EVERY MONDAY 12 capsule 1  . VOLTAREN 1 % GEL APPLY 2 GM TOPICALLY FOUR TIMES DAILY AS NEEDED FOR PAIN 300 g 1   No current facility-administered medications for this encounter.    Vitals:   10/26/16 1059  BP: (!) 154/96  Pulse: 82  SpO2: 99%  Weight: 246 lb (111.6 kg)    Wt Readings from Last 3 Encounters:  10/26/16 246 lb (111.6 kg)  09/10/16 253 lb (114.8 kg)  08/11/16 255 lb 11.7 oz (116 kg)     PHYSICAL EXAM: General:  NAD. No resp difficulty; Neck: supple. Thick. JVP 7 cm. Carotids 2+ bilaterally; no bruits. Thyroid symmetrically enlarged. Chronic left facial droop.  Cor: PMI nonpalpable; RRR. No rubs, gallops or murmurs. No S3/S4.  Lungs: CTAB, normal effort Abdomen: obese, soft, NT, ND, no HSM. No bruits or masses. +BS  Extremities: no cyanosis, clubbing, rash, No ankle edema.  Neuro:  alert & orientedx3, cranial nerves grossly intact. Moves all 4 extremities w/o difficulty. Affect pleasant.  ASSESSMENT/PLAN: 1. Chronic systolic HF:  - Nonischemic cardiomyopathy, normal coronaries on 6/12 angiography.  Cardiomyopathy may be due to long-standing and poorly-controlled HTN.  Echo 11/16 EF 25-30%.  S/P CRT-D Medtronic.  NYHA class II-III symptoms,  - Volume status looks OK on exam and optivol.   - Currently using lasix prn with incontinence. She is on Vesicare.  Encouraged to weigh  daily and use as needed. - Continue spironolactone 25 mg daily. - Continue digoxin 0.0625 mg daily.  - Continue Toprol XL 100 mg BID.    - Increase hydralazine 75 mg tid  - Continue Imdur 90 mg daily.   - No ACE-I/ARB/ARNI due to angioedema.   2. HTN:  - BP remains somewhat elevated today.  She took her medications yesterday and this am.   - Increase hydralazine to 75 mg TID.  3. Morbid obesity:  - Increase activity as able.    4. OSA on CPAP:   - Encouraged to use CPAP nightly.    5. CVA:  Continue ASA and atorvastatin.  Paramedicine present. Should see in home.  Meds as above. HFSW to see today.  BMET and Dig level today.  Follow up 6-8 weeks with MD and Echo.   Shirley Friar, PA-C 10/26/2016

## 2016-10-26 NOTE — Telephone Encounter (Signed)
[name] called stating she wanted to call to let this writer know she was feeling better today. States her blood sugar this morning was 121. She states she is on her way to the cardiologist, and states she is compliant with her medications. Encouraged to purchase a blood pressure cuff, she verbalizes understanding.

## 2016-10-26 NOTE — Progress Notes (Signed)
Paramedicine Multidisciplinary Team Update  [name] is enrolled in the [employer] Program through [facility].  The patient presents today in association with an Advanced Heart Failure Clinic Appointment.  Patient living/home environment and social support-- Patient reports she is currently staying in a motel ($250) week with her daughter. Patient is on the CAP program (Joy Herd caseworker [phone]). She has HHA services [facility]) 7 days a week (am & pm) thru [employer]. Patient reports her support system is her two children (32yo daughter and 90 yo son) and her HAA Celeste.   Insurance/ Prescription Coverage-- Patient has SSI ($735 monthly) and has medicaid.  Does the patient have a scale and weigh each day? No, paramedic to bring one to the home when available  Does the patient follow a low salt diet? Patient aware  Is patient compliant with medications? Patient reports she needs pill box. Paramedic will bring to the home.  Does the patient have transportation for physician appointments? Patient reports her HHA drives her to most appointments but also has access to [facility] transportation.  Does the patient contact the HF Clinic appropriately with worsening symptoms or weight increases? Verbalizes  Do you have an Advanced Directive? No  Are there any identified obstacles / challenges for adherence to current treatment plan? Patient reports her most pressing issue at the moment is housing. Patient currently staying in a motel and states CAP caseworker assisting with locating housing options. CSW will explore housing options and reach out to CAP worker. Lasandra Beech, LCSW [phone]

## 2016-10-26 NOTE — Patient Instructions (Signed)
INCREASE Hydralazine to 75mg  (1 &1/2 tablets) three times daily.  Routine lab work today. Will notify you of abnormal results  Follow up and Echo with Dr.McLean in 7-8 weeks.

## 2016-10-28 ENCOUNTER — Ambulatory Visit (INDEPENDENT_AMBULATORY_CARE_PROVIDER_SITE_OTHER): Payer: Medicaid Other | Admitting: Orthopedic Surgery

## 2016-11-03 ENCOUNTER — Ambulatory Visit (INDEPENDENT_AMBULATORY_CARE_PROVIDER_SITE_OTHER): Payer: Medicaid Other | Admitting: Sports Medicine

## 2016-11-03 ENCOUNTER — Other Ambulatory Visit: Payer: Self-pay | Admitting: Internal Medicine

## 2016-11-03 DIAGNOSIS — M25512 Pain in left shoulder: Secondary | ICD-10-CM

## 2016-11-03 DIAGNOSIS — G8929 Other chronic pain: Secondary | ICD-10-CM

## 2016-11-03 DIAGNOSIS — R21 Rash and other nonspecific skin eruption: Secondary | ICD-10-CM

## 2016-11-03 NOTE — Assessment & Plan Note (Signed)
Overall doing well after acromioclavicular, subacromial, and glenohumeral injections. I'm going to set her up with home health physical therapy, no further injections until January or February of next year. Return to see me as needed.

## 2016-11-03 NOTE — Progress Notes (Signed)
  Subjective:    CC: Left shoulder pain  HPI: This is a pleasant 50 year old female post CVA, we have injected her acromioclavicular joint, glenohumeral joint, subacromial bursa, overall she tells me she has some relief.  Past medical history:  Negative.  See flowsheet/record as well for more information.  Surgical history: Negative.  See flowsheet/record as well for more information.  Family history: Negative.  See flowsheet/record as well for more information.  Social history: Negative.  See flowsheet/record as well for more information.  Allergies, and medications have been entered into the medical record, reviewed, and no changes needed.   Review of Systems: No fevers, chills, night sweats, weight loss, chest pain, or shortness of breath.   Objective:    General: Well Developed, well nourished, and in no acute distress.  Neuro: Alert and oriented x3, extra-ocular muscles intact, sensation grossly intact.  HEENT: Normocephalic, atraumatic, pupils equal round reactive to light, neck supple, no masses, no lymphadenopathy, thyroid nonpalpable.  Skin: Warm and dry, no rashes. Cardiac: Regular rate and rhythm, no murmurs rubs or gallops, no lower extremity edema.  Respiratory: Clear to auscultation bilaterally. Not using accessory muscles, speaking in full sentences.  Impression and Recommendations:    Left shoulder pain Overall doing well after acromioclavicular, subacromial, and glenohumeral injections. I'm going to set her up with home health physical therapy, no further injections until January or February of next year. Return to see me as needed.  I spent 25 minutes with this patient, greater than 50% was face-to-face time counseling regarding the above diagnoses

## 2016-11-08 ENCOUNTER — Telehealth: Payer: Self-pay | Admitting: Sports Medicine

## 2016-11-08 DIAGNOSIS — G8929 Other chronic pain: Secondary | ICD-10-CM

## 2016-11-08 DIAGNOSIS — M25512 Pain in left shoulder: Principal | ICD-10-CM

## 2016-11-08 NOTE — Telephone Encounter (Signed)
Dr. Benjamin Stain    Please place another referral for Home Health with a diagnosis of shoulder pain unassociated with CVA due to Medicaid will not pay if it is associated with CVA since it has been past 6 months. Also if you think she would benefit from skilled nursing as well please add to new referral. I will resend once the new referral is placed.   Thank you Arline Asp

## 2016-11-09 NOTE — Telephone Encounter (Signed)
Done

## 2016-11-10 ENCOUNTER — Other Ambulatory Visit: Payer: Self-pay | Admitting: Internal Medicine

## 2016-11-10 ENCOUNTER — Telehealth: Payer: Self-pay

## 2016-11-10 DIAGNOSIS — E1140 Type 2 diabetes mellitus with diabetic neuropathy, unspecified: Secondary | ICD-10-CM

## 2016-11-10 DIAGNOSIS — E1165 Type 2 diabetes mellitus with hyperglycemia: Principal | ICD-10-CM

## 2016-11-10 DIAGNOSIS — IMO0002 Reserved for concepts with insufficient information to code with codable children: Secondary | ICD-10-CM

## 2016-11-10 NOTE — Telephone Encounter (Signed)
Christina from [facility] needs to speak with a nurse regarding bactroban.

## 2016-11-11 ENCOUNTER — Other Ambulatory Visit (HOSPITAL_COMMUNITY): Payer: Self-pay | Admitting: Pharmacist

## 2016-11-11 DIAGNOSIS — I10 Essential (primary) hypertension: Secondary | ICD-10-CM

## 2016-11-11 MED ORDER — HYDRALAZINE HCL 50 MG PO TABS: 75.0000 mg | ORAL_TABLET | Freq: Three times a day (TID) | ORAL | 2 refills | Status: DC

## 2016-11-11 MED ORDER — PANTOPRAZOLE SODIUM 40 MG PO TBEC: 40.0000 mg | DELAYED_RELEASE_TABLET | Freq: Every day | ORAL | 2 refills | Status: DC

## 2016-11-16 ENCOUNTER — Encounter (HOSPITAL_COMMUNITY): Payer: Self-pay

## 2016-11-16 NOTE — Progress Notes (Signed)
[place] [facility] mailed medical record request dated 10/11/2016 for records 12/13/2014--present. All records from CHF clinic for these date parameters faxed to provided # [phone] (57 pages total). Copy of record request scanned into patient's electronic medical record. Case # [id number]  Ave Filter, RN

## 2016-11-16 NOTE — Telephone Encounter (Signed)
This was prescribed by another doctor

## 2016-11-19 ENCOUNTER — Telehealth: Payer: Self-pay | Admitting: Internal Medicine

## 2016-11-19 NOTE — Telephone Encounter (Signed)
APT. REMINDER CALL, LMTCB °

## 2016-11-22 ENCOUNTER — Ambulatory Visit (INDEPENDENT_AMBULATORY_CARE_PROVIDER_SITE_OTHER): Payer: Medicaid Other | Admitting: Internal Medicine

## 2016-11-22 VITALS — BP 112/71 | HR 94 | Temp 97.7°F | Ht 69.0 in | Wt 244.7 lb

## 2016-11-22 DIAGNOSIS — E1140 Type 2 diabetes mellitus with diabetic neuropathy, unspecified: Secondary | ICD-10-CM

## 2016-11-22 DIAGNOSIS — E1142 Type 2 diabetes mellitus with diabetic polyneuropathy: Secondary | ICD-10-CM | POA: Diagnosis not present

## 2016-11-22 DIAGNOSIS — I5042 Chronic combined systolic (congestive) and diastolic (congestive) heart failure: Secondary | ICD-10-CM | POA: Diagnosis not present

## 2016-11-22 DIAGNOSIS — I69354 Hemiplegia and hemiparesis following cerebral infarction affecting left non-dominant side: Secondary | ICD-10-CM

## 2016-11-22 DIAGNOSIS — R1032 Left lower quadrant pain: Secondary | ICD-10-CM

## 2016-11-22 DIAGNOSIS — M545 Low back pain, unspecified: Secondary | ICD-10-CM

## 2016-11-22 DIAGNOSIS — M25552 Pain in left hip: Secondary | ICD-10-CM

## 2016-11-22 DIAGNOSIS — I10 Essential (primary) hypertension: Secondary | ICD-10-CM

## 2016-11-22 DIAGNOSIS — M79652 Pain in left thigh: Secondary | ICD-10-CM | POA: Diagnosis not present

## 2016-11-22 DIAGNOSIS — I69392 Facial weakness following cerebral infarction: Secondary | ICD-10-CM | POA: Diagnosis not present

## 2016-11-22 DIAGNOSIS — E1165 Type 2 diabetes mellitus with hyperglycemia: Secondary | ICD-10-CM

## 2016-11-22 DIAGNOSIS — I110 Hypertensive heart disease with heart failure: Secondary | ICD-10-CM

## 2016-11-22 DIAGNOSIS — Z79899 Other long term (current) drug therapy: Secondary | ICD-10-CM

## 2016-11-22 DIAGNOSIS — IMO0002 Reserved for concepts with insufficient information to code with codable children: Secondary | ICD-10-CM

## 2016-11-22 DIAGNOSIS — Z794 Long term (current) use of insulin: Secondary | ICD-10-CM | POA: Diagnosis not present

## 2016-11-22 HISTORY — DX: Low back pain, unspecified: M54.50

## 2016-11-22 LAB — GLUCOSE, CAPILLARY: Glucose-Capillary: 196 mg/dL — ABNORMAL HIGH (ref 65–99)

## 2016-11-22 LAB — POCT GLYCOSYLATED HEMOGLOBIN (HGB A1C): Hemoglobin A1C: 9.1

## 2016-11-22 MED ORDER — INSULIN GLARGINE 100 UNIT/ML SOLOSTAR PEN: 40.0000 [IU] | PEN_INJECTOR | Freq: Every day | SUBCUTANEOUS | 3 refills | Status: DC

## 2016-11-22 MED ORDER — METFORMIN HCL ER 500 MG PO TB24: 500.0000 mg | ORAL_TABLET | Freq: Two times a day (BID) | ORAL | 11 refills | Status: DC

## 2016-11-22 NOTE — Assessment & Plan Note (Addendum)
Lab Results  Component Value Date   HGBA1C 9.1 11/22/2016   HGBA1C 8.5 09/10/2016   HGBA1C 8.1 04/29/2016    Recent Labs  11/22/16 1406  GLUCAP 196*   Reports qAM BGs 120s-170s, occasionally 200s to 300s.  Current medications: none Current insulin: taking Lantus 35U QHS (presciption for 45U in EHR)  When she was seen in 08/2016, her A1c was 8.5% but she had lost her glucometer.  No changes to her insulin regimen were made at that time because she had lost her glucometer.  She had been prescribed metformin 1000 mg BID, but reported that she was no longer taking it last month due to diarrhea.  Assessment HgbA1c goal: <7.0% Glycemic control: uncontrolled Complications: peripheral neuropathy  Plan Medications: Metformin XR 500 mg daily, increase to 500 BID if tolerated Insulin: Increase Lantus to 40U daily Other: -encouraged to scheduled eye exam
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
## 2016-11-22 NOTE — Assessment & Plan Note (Addendum)
See in Heart Failure clinic on 10/26/2016, hydralazine increase to 75 mg TID at that time.  Currents meds: metoprolol succinate 200 mg daily, spironolactone 50 mg daily, digoxin 0.125 mg daily  She is not on ACEI/ARB due to history of angioedema  Euvolemic today without symptoms of heart failure decompensation. -Continue current meds -Follow up with Heart Failure as recommended

## 2016-11-22 NOTE — Progress Notes (Signed)
CC: Low back pain  HPI:  [name] is a 50 y.o. woman with history of HTN, DM2, nonischemic CHF (EF 25-30%, w/ ICD), and CVA (left hemiparesis) who presents for management of diabetes.  Please see A&P for status of the patient's chronic medical conditions.    Past Medical History:  Diagnosis Date  . Allergic rhinitis   . Arthritis    "hips, back, legs, arms" (07/04/2014)  . Asthma    hx  . Automatic implantable cardioverter-defibrillator in situ   . Calcifying tendinitis of shoulder   . Chronic combined systolic and diastolic CHF (congestive heart failure) (HCC)    EF 40-45% by echo 12/06/2012  . Chronic diastolic heart failure (HCC)     Primarily diastolic CHF: Likely due to uncontrolled HTN. Last echo (8/12) with EF 45-50%, mild to moderate LVH with some asymmetric septal hypertrophy, RV normal size and systolic function. EF 50-55% by LV-gram in 6/12.   . Chronic lower back pain    secondary to DJD, obsetiy, hip problems. Followed by Dr. Ivory Broad (pain management)  . Coronary artery disease    questionable. LHC 05/2011 showing normal coronaries // Followed at [facility], Dr. Shirlee Latch  . Degeneration of lumbar or lumbosacral intervertebral disc   . DJD (degenerative joint disease) of hip    right sided  . Frequent UTI   . GERD (gastroesophageal reflux disease)   . HLD (hyperlipidemia)   . Hypertension    Poorly controlled. Has had HTN since age 64. Angioedema with ACEI.  24 Hr urine and renal arterial dopplers ordered . . . Never done  . LBBB (left bundle branch block)   . Liver disease   . Morbid obesity (HCC)   . NICM (nonischemic cardiomyopathy) (HCC)    EF 45-50% in 8/12, cath 6/12 showed normal coronaries, EF 50-55% by LV gram  . OSA on CPAP    sleep study in 8/12 showed moderate to severe OSA requiring CPAP  . Polyneuropathy in diabetes(357.2)   . Presence of permanent cardiac pacemaker   . Seizures (HCC)    last 3 months  . Shortness of breath     none now  . Stroke [facility]) 12/2013   "my left side is paralyzed" (07/04/2014)  . Thoracic or lumbosacral neuritis or radiculitis, unspecified   . Type II diabetes mellitus (HCC) DX: 2002   Review of Systems:  Review of Systems  Constitutional: Negative for chills and fever.  Respiratory: Negative for shortness of breath and wheezing.   Cardiovascular: Negative for chest pain and leg swelling.  Musculoskeletal: Positive for back pain.   Physical Exam:  Vitals:   11/22/16 1415  BP: 112/71  Pulse: 94  Temp: 97.7 F (36.5 C)  TempSrc: Oral  SpO2: 99%  Weight: 244 lb 11.2 oz (111 kg)  Height: 5\' 9"  (1.753 m)   Physical Exam  Constitutional: She is oriented to person, place, and time.  Pleasant woman, obese, in wheelchair, with mild left facial droop  Cardiovascular: Normal rate, regular rhythm and normal heart sounds.   No LE edema  Pulmonary/Chest: Effort normal and breath sounds normal. She has no rales.  Musculoskeletal:  Mild L4-5 midline tenderness R lateral hip tenderness Stands, able to bear weight on R leg normally  Neurological: She is alert and oriented to person, place, and time.  Psychiatric: She has a normal mood and affect. Her behavior is normal.    Assessment & Plan:   See Encounters Tab for problem based charting.  Patient seen with Dr. Lynnae January

## 2016-11-22 NOTE — Assessment & Plan Note (Addendum)
BP Readings from Last 3 Encounters:  11/22/16 112/71  11/03/16 136/81  10/26/16 (!) 154/96   Lab Results  Component Value Date   CREATININE 0.76 10/26/2016   Lab Results  Component Value Date   K 3.8 10/26/2016    Current medications: amlodipine 10 mg daily, hydralazine 75 mg TID, Imdur 60 mg daily, metoprolol succinate 200 mg daily, spironolactone 50 mg daily  Assessment BP goal: 140/90 BP control: well controlled  Plan  Medications: continue current meds Other:

## 2016-11-22 NOTE — Patient Instructions (Addendum)
For your diabetes, I have prescribed you a different kind of Metformin.  Take one 500 mg pill once a day for the first week, then if you aren't having too much diarrhea, go up to 2 pills per day.  If you have diarrhea at 2 pills per day, you can go back to one pill a day rather than stopping altogether.  Also, please start taking 40U Lantus at night rather than 35U.  Keep checking your blood sugars first thing in the morning, and make sure to bring your glucometer to your next appointment.  For your hip pain, try taking Advil 400 mg every 6 hours.  If things aren't getting better in 1-2 weeks, call for an appointment.  Otherwise I'll see you in 3 months to check on the diabetes again.

## 2016-11-24 ENCOUNTER — Other Ambulatory Visit: Payer: Self-pay | Admitting: Physical Medicine & Rehabilitation

## 2016-11-24 ENCOUNTER — Other Ambulatory Visit (HOSPITAL_COMMUNITY): Payer: Self-pay | Admitting: Adult Health

## 2016-11-24 ENCOUNTER — Other Ambulatory Visit: Payer: Self-pay | Admitting: Internal Medicine

## 2016-11-24 ENCOUNTER — Other Ambulatory Visit: Payer: Self-pay | Admitting: Cardiology

## 2016-11-24 DIAGNOSIS — M1712 Unilateral primary osteoarthritis, left knee: Secondary | ICD-10-CM

## 2016-11-24 DIAGNOSIS — I5032 Chronic diastolic (congestive) heart failure: Secondary | ICD-10-CM

## 2016-11-24 DIAGNOSIS — M47896 Other spondylosis, lumbar region: Secondary | ICD-10-CM

## 2016-11-26 ENCOUNTER — Other Ambulatory Visit: Payer: Self-pay | Admitting: Internal Medicine

## 2016-11-26 NOTE — Progress Notes (Signed)
Internal Medicine Clinic Attending  I saw and evaluated the patient.  I personally confirmed the key portions of the history and exam documented by Dr. O'Sullivan and I reviewed pertinent patient test results.  The assessment, diagnosis, and plan were formulated together and I agree with the documentation in the resident's note.   

## 2016-12-02 ENCOUNTER — Other Ambulatory Visit (HOSPITAL_COMMUNITY): Payer: Self-pay | Admitting: Adult Health

## 2016-12-03 ENCOUNTER — Other Ambulatory Visit: Payer: Self-pay | Admitting: Internal Medicine

## 2016-12-09 ENCOUNTER — Telehealth: Payer: Self-pay | Admitting: Dietician

## 2016-12-09 NOTE — Telephone Encounter (Signed)
Called patient about how her blood sugars are doing on her increased dose of Lantus insulin. She says "Doing good, better". Mostly in the 100s now on 45 units of Lantus every morning. When asked for numbers she says her fasting blood sugars are ~ 114 to 172 this am. She has one in the 200s and that was after eating.      I confirmed that she is taking 45 units first thing in the morning since her chart indicates she is supposed to be taking 40 units. She says she went up to 45 units and is doing fine so stayed on that much. Also, she is tolerating her new formulation of metformin. She does not have a follow up appointment yet, she says she thought our office was going to mail one to her.

## 2016-12-10 ENCOUNTER — Telehealth (HOSPITAL_COMMUNITY): Payer: Self-pay | Admitting: *Deleted

## 2016-12-10 NOTE — Telephone Encounter (Signed)
[name] called reporting patient's blood pressure to be elevated at 150/102 and 180/73.  Patient said she had not taken her meds one day and [name] educated on taking medications as prescribed.  Patient has appointment with [place] in 2 weeks.  [name] will continue to monitor for now.

## 2016-12-14 ENCOUNTER — Encounter: Payer: Self-pay | Admitting: Internal Medicine

## 2016-12-14 DIAGNOSIS — Z79891 Long term (current) use of opiate analgesic: Secondary | ICD-10-CM

## 2016-12-14 HISTORY — DX: Long term (current) use of opiate analgesic: Z79.891

## 2016-12-15 ENCOUNTER — Encounter: Payer: Medicaid Other | Attending: Physical Medicine & Rehabilitation | Admitting: Physical Medicine & Rehabilitation

## 2016-12-15 ENCOUNTER — Other Ambulatory Visit: Payer: Self-pay | Admitting: Physical Medicine & Rehabilitation

## 2016-12-15 ENCOUNTER — Encounter: Payer: Self-pay | Admitting: Physical Medicine & Rehabilitation

## 2016-12-15 VITALS — BP 145/89 | HR 116 | Resp 14

## 2016-12-15 DIAGNOSIS — Z5181 Encounter for therapeutic drug level monitoring: Secondary | ICD-10-CM | POA: Insufficient documentation

## 2016-12-15 DIAGNOSIS — E1140 Type 2 diabetes mellitus with diabetic neuropathy, unspecified: Secondary | ICD-10-CM

## 2016-12-15 DIAGNOSIS — E1142 Type 2 diabetes mellitus with diabetic polyneuropathy: Secondary | ICD-10-CM

## 2016-12-15 DIAGNOSIS — I5042 Chronic combined systolic (congestive) and diastolic (congestive) heart failure: Secondary | ICD-10-CM | POA: Insufficient documentation

## 2016-12-15 DIAGNOSIS — G629 Polyneuropathy, unspecified: Secondary | ICD-10-CM | POA: Diagnosis present

## 2016-12-15 DIAGNOSIS — M47817 Spondylosis without myelopathy or radiculopathy, lumbosacral region: Secondary | ICD-10-CM | POA: Diagnosis present

## 2016-12-15 DIAGNOSIS — IMO0002 Reserved for concepts with insufficient information to code with codable children: Secondary | ICD-10-CM

## 2016-12-15 DIAGNOSIS — G8110 Spastic hemiplegia affecting unspecified side: Secondary | ICD-10-CM | POA: Insufficient documentation

## 2016-12-15 DIAGNOSIS — I63311 Cerebral infarction due to thrombosis of right middle cerebral artery: Secondary | ICD-10-CM

## 2016-12-15 DIAGNOSIS — G8114 Spastic hemiplegia affecting left nondominant side: Secondary | ICD-10-CM

## 2016-12-15 DIAGNOSIS — E1342 Other specified diabetes mellitus with diabetic polyneuropathy: Secondary | ICD-10-CM | POA: Insufficient documentation

## 2016-12-15 DIAGNOSIS — E1165 Type 2 diabetes mellitus with hyperglycemia: Secondary | ICD-10-CM

## 2016-12-15 DIAGNOSIS — Z79899 Other long term (current) drug therapy: Secondary | ICD-10-CM

## 2016-12-15 DIAGNOSIS — I63319 Cerebral infarction due to thrombosis of unspecified middle cerebral artery: Secondary | ICD-10-CM | POA: Diagnosis present

## 2016-12-15 MED ORDER — OXYCODONE-ACETAMINOPHEN 10-325 MG PO TABS: 1.0000 | ORAL_TABLET | Freq: Three times a day (TID) | ORAL | 0 refills | Status: DC | PRN

## 2016-12-15 NOTE — Addendum Note (Signed)
Addended by: Barbee Shropshire B on: 12/15/2016 01:24 PM   Modules accepted: Orders

## 2016-12-15 NOTE — Patient Instructions (Signed)
PLEASE CALL ME WITH ANY PROBLEMS OR QUESTIONS ([phone])  

## 2016-12-15 NOTE — Progress Notes (Signed)
Botox Injection for spasticity using needle EMG guidance Indication: Left spastic hemiparesis (HCC) - Plan: oxyCODONE-acetaminophen (PERCOCET) 10-325 MG tablet, DISCONTINUED: oxyCODONE-acetaminophen (PERCOCET) 10-325 MG tablet   Dilution: 100 Units/ml        Total Units Injected: 300 Indication: Severe spasticity which interferes with ADL,mobility and/or  hygiene and is unresponsive to medication management and other conservative care Informed consent was obtained after describing risks and benefits of the procedure with the patient. This includes bleeding, bruising, infection, excessive weakness, or medication side effects. A REMS form is on file and signed.  Needle: 25mm injectable monopolar needle electrode  Number of units per muscle Pectoralis Major 0 units Pectoralis Minor 0 units Biceps 0 units Brachioradialis 0 units FCR 25 units FCU 25 units FDS 100 units FDP 100 units FPL 50 units Pronator Teres 0 units Pronator Quadratus 0 units  All injections were done after obtaining appropriate EMG activity and after negative drawback for blood. The patient tolerated the procedure well. Post procedure instructions were given. Return in about 2 months (around 02/12/2017).

## 2016-12-21 LAB — TOXASSURE SELECT,+ANTIDEPR,UR

## 2016-12-22 NOTE — Progress Notes (Signed)
Urine drug screen for this encounter is consistent for prescribed medication 

## 2016-12-23 ENCOUNTER — Other Ambulatory Visit: Payer: Self-pay | Admitting: Internal Medicine

## 2016-12-23 ENCOUNTER — Other Ambulatory Visit (HOSPITAL_COMMUNITY): Payer: Self-pay | Admitting: Adult Health

## 2016-12-23 DIAGNOSIS — I5032 Chronic diastolic (congestive) heart failure: Secondary | ICD-10-CM

## 2016-12-28 ENCOUNTER — Ambulatory Visit (HOSPITAL_COMMUNITY): Admission: RE | Admit: 2016-12-28 | Payer: Medicaid Other | Source: Ambulatory Visit

## 2016-12-28 ENCOUNTER — Inpatient Hospital Stay (HOSPITAL_COMMUNITY): Admission: RE | Admit: 2016-12-28 | Payer: Medicaid Other | Source: Ambulatory Visit

## 2016-12-31 ENCOUNTER — Telehealth: Payer: Self-pay | Admitting: Dietician

## 2016-12-31 NOTE — Telephone Encounter (Signed)
Patient does not have a regular eye doctor and says she would appreciate a referral to one.  Thank you!

## 2017-01-04 ENCOUNTER — Other Ambulatory Visit: Payer: Self-pay | Admitting: Internal Medicine

## 2017-01-04 DIAGNOSIS — B372 Candidiasis of skin and nail: Secondary | ICD-10-CM

## 2017-01-04 NOTE — Telephone Encounter (Signed)
Refil request from pt for both nystatin cream and powder.  The powder was no longer on pt's medication list.  I will send request to pcp for review to see if pt needs both powder and cream.  Please advise.Caitlyn Spittle Cassady1/23/201812:26 PM

## 2017-01-04 NOTE — Telephone Encounter (Signed)
Refill Request for nystatin cream (MYCOSTATIN) .  Patient also states she needs to the Powder as well.  Please call patient back.

## 2017-01-06 MED ORDER — NYSTATIN 100000 UNIT/GM EX POWD: CUTANEOUS | 2 refills | Status: DC

## 2017-01-06 MED ORDER — NYSTATIN 100000 UNIT/GM EX CREA
TOPICAL_CREAM | CUTANEOUS | 0 refills | Status: DC
Start: 2017-01-06 — End: 2017-03-28

## 2017-01-10 ENCOUNTER — Other Ambulatory Visit: Payer: Self-pay | Admitting: Cardiology

## 2017-01-13 DIAGNOSIS — Z736 Limitation of activities due to disability: Secondary | ICD-10-CM

## 2017-01-20 ENCOUNTER — Other Ambulatory Visit (INDEPENDENT_AMBULATORY_CARE_PROVIDER_SITE_OTHER): Payer: Self-pay | Admitting: Orthopedic Surgery

## 2017-01-20 MED ORDER — GABAPENTIN 300 MG PO CAPS: 300.0000 mg | ORAL_CAPSULE | Freq: Two times a day (BID) | ORAL | 2 refills | Status: DC

## 2017-01-20 NOTE — Telephone Encounter (Signed)
Rx request entered and sent to [facility] for gapapentin refill.

## 2017-01-20 NOTE — Telephone Encounter (Signed)
[facility] pharmacy calling for status of pt refill request sent on 2/2 for Erin  [phone]

## 2017-01-24 ENCOUNTER — Other Ambulatory Visit (HOSPITAL_COMMUNITY): Payer: Self-pay | Admitting: Adult Health

## 2017-01-24 ENCOUNTER — Other Ambulatory Visit: Payer: Self-pay | Admitting: Cardiology

## 2017-01-24 ENCOUNTER — Other Ambulatory Visit (HOSPITAL_COMMUNITY): Payer: Self-pay | Admitting: Internal Medicine

## 2017-01-24 ENCOUNTER — Other Ambulatory Visit: Payer: Self-pay | Admitting: Physical Medicine & Rehabilitation

## 2017-01-24 ENCOUNTER — Other Ambulatory Visit: Payer: Self-pay | Admitting: Internal Medicine

## 2017-01-24 DIAGNOSIS — M1712 Unilateral primary osteoarthritis, left knee: Secondary | ICD-10-CM

## 2017-01-24 DIAGNOSIS — M47896 Other spondylosis, lumbar region: Secondary | ICD-10-CM

## 2017-01-24 DIAGNOSIS — I5032 Chronic diastolic (congestive) heart failure: Secondary | ICD-10-CM

## 2017-01-25 MED ORDER — GABAPENTIN 300 MG PO CAPS: 300.0000 mg | ORAL_CAPSULE | Freq: Two times a day (BID) | ORAL | 2 refills | Status: DC

## 2017-01-25 NOTE — Addendum Note (Signed)
Addended by: Donalee Citrin on: 01/25/2017 02:50 PM   Modules accepted: Orders

## 2017-01-25 NOTE — Telephone Encounter (Signed)
Order sent to [facility] pharmacy per patient request, originally sent to [facility] by mistake.

## 2017-01-27 ENCOUNTER — Other Ambulatory Visit (HOSPITAL_COMMUNITY): Payer: Self-pay

## 2017-01-27 ENCOUNTER — Telehealth (HOSPITAL_COMMUNITY): Payer: Self-pay | Admitting: *Deleted

## 2017-01-27 NOTE — Progress Notes (Signed)
Paramedicine Encounter    Patient ID: [name], female    DOB: May 27, 1966, 51 y.o.   MRN: [id number]    Patient Care Team: Minus Liberty, MD as PCP - General  Patient Active Problem List   Diagnosis Date Noted  . Opioid dependence ([place]) 12/14/2016  . Lumbago 11/22/2016  . Vaginal odor 09/10/2016  . Papular eruption 07/14/2016  . Type 2 diabetes mellitus, uncontrolled, with neuropathy ([place]) 02/04/2016  . Contact dermatitis 02/03/2016  . Left spastic hemiparesis ([place]) 09/23/2015  . Constipation 09/09/2015  . Left shoulder pain 06/30/2015  . Localization-related symptomatic epilepsy and epileptic syndromes with complex partial seizures, not intractable, without status epilepticus ([place]) 06/13/2015  . Hemiparesis affecting left side as late effect of cerebrovascular accident ([place]) 06/13/2015  . Morbid obesity ([place]) 06/13/2015  . Ulcer of left foot ([place]) 06/11/2015  . Candidal intertrigo 11/05/2014  . History of seizure 10/14/2014  . Vitamin D deficiency 10/01/2014  . Tachycardia 07/04/2014  . Generalized weakness 07/04/2014  . Perimenopausal menorrhagia 12/20/2013  . Stroke ([place]) 12/13/2013  . Biventricular implantable cardiac defibrillator -Medtronic 11/23/2013  . Mixed stress and urge urinary incontinence 10/11/2013  . Preventative health care 09/13/2013  . Chronic combined systolic and diastolic heart failure ([place]) 08/01/2013  . Nonischemic cardiomyopathy ([place]) 08/01/2013  . Goiter 08/01/2013  . LBBB (left bundle branch block) 07/19/2013  . Depression 04/09/2013  . Primary osteoarthritis of left knee 04/14/2012  . Lumbar spondylosis 04/14/2012  . Diabetic peripheral neuropathy ([place]) 04/14/2012  . Hyperlipidemia 11/23/2011  . OSA (obstructive sleep apnea) 08/27/2011  . Benign essential HTN 08/27/2010    Current Outpatient Prescriptions:  .  ACCU-CHEK FASTCLIX LANCETS MISC, Check blood sugar 3 times a day (Patient not taking: Reported on 01/27/2017), Disp: 102  each, Rfl: 12 .  ACCU-CHEK SMARTVIEW test strip, USE TO CHECK BLOOD SUGAR THREE TIMES DAILY (Patient not taking: Reported on 01/27/2017), Disp: 100 each, Rfl: 12 .  amLODipine (NORVASC) 10 MG tablet, TAKE 1 TABLET BY MOUTH EVERY DAY (Patient not taking: Reported on 01/27/2017), Disp: 30 tablet, Rfl: 6 .  ASPIRIN LOW DOSE 81 MG EC tablet, TAKE 1 TABLET BY MOUTH EVERY DAY (Patient not taking: Reported on 01/27/2017), Disp: 30 tablet, Rfl: 11 .  atorvastatin (LIPITOR) 40 MG tablet, TAKE 1 TABLET BY MOUTH EVERY DAY (Patient not taking: Reported on 01/27/2017), Disp: 30 tablet, Rfl: 3 .  Blood Glucose Monitoring Suppl (ACCU-CHEK GUIDE) w/Device KIT, 1 each by Does not apply route 3 (three) times daily. (Patient not taking: Reported on 01/27/2017), Disp: 1 kit, Rfl: 1 .  calamine lotion, Apply 1 application topically as needed for itching. (Patient not taking: Reported on 01/27/2017), Disp: 120 mL, Rfl: 0 .  citalopram (CELEXA) 20 MG tablet, TAKE 1 TABLET BY MOUTH EVERY DAY (Patient not taking: Reported on 01/27/2017), Disp: 90 tablet, Rfl: 2 .  cyclobenzaprine (FLEXERIL) 10 MG tablet, TAKE ONE TABLET BY MOUTH 3 TIMES A DAY IF NEEDED FOR MUSCLE SPASMS (Patient not taking: Reported on 01/27/2017), Disp: 90 tablet, Rfl: 1 .  DIGOX 125 MCG tablet, TAKE ONE TABLET BY MOUTH EVERY DAY (Patient not taking: Reported on 01/27/2017), Disp: 30 tablet, Rfl: 11 .  docusate sodium (COLACE) 50 MG capsule, Take 1 capsule (50 mg total) by mouth 2 (two) times daily as needed for mild constipation. (Patient not taking: Reported on 01/27/2017), Disp: 60 capsule, Rfl: 6 .  fluticasone (CUTIVATE) 0.05 % cream, Apply topically 2 (two) times daily. Dispense two tubes (Patient not taking: Reported  on 01/27/2017), Disp: 30 g, Rfl: 6 .  furosemide (LASIX) 40 MG tablet, TAKE 1 TABLET BY MOUTH TWICE A WEEK (Patient not taking: Reported on 01/27/2017), Disp: 10 tablet, Rfl: 2 .  gabapentin (NEURONTIN) 300 MG capsule, Take 1 capsule (300 mg total) by  mouth 2 (two) times daily. (Patient not taking: Reported on 01/27/2017), Disp: 60 capsule, Rfl: 2 .  hydrALAZINE (APRESOLINE) 50 MG tablet, Take 1.5 tablets (75 mg total) by mouth 3 (three) times daily. (Patient not taking: Reported on 01/27/2017), Disp: 135 tablet, Rfl: 2 .  Insulin Glargine (LANTUS SOLOSTAR) 100 UNIT/ML Solostar Pen, Inject 40 Units into the skin daily at 10 pm. Inject 45 units in the PM. (Patient not taking: Reported on 01/27/2017), Disp: 45 mL, Rfl: 3 .  isosorbide mononitrate (IMDUR) 60 MG 24 hr tablet, TAKE 1 & 1/2 TABLETS BY MOUTH EVERY DAY (Patient not taking: Reported on 01/27/2017), Disp: 45 tablet, Rfl: 2 .  levETIRAcetam (KEPPRA) 500 MG tablet, TAKE 1 TABLET BY MOUTH TWO TIMES DAILY **MUST NOT MISS ANY DOSE** (Patient not taking: Reported on 01/27/2017), Disp: 180 tablet, Rfl: 1 .  metFORMIN (GLUCOPHAGE XR) 500 MG 24 hr tablet, Take 1 tablet (500 mg total) by mouth 2 (two) times daily. (Patient not taking: Reported on 01/27/2017), Disp: 60 tablet, Rfl: 11 .  metoprolol succinate (TOPROL-XL) 100 MG 24 hr tablet, TAKE 1 TABLET BY MOUTH TWO TIMES DAILY (Patient not taking: Reported on 01/27/2017), Disp: 60 tablet, Rfl: 2 .  mupirocin cream (BACTROBAN) 2 %, Apply 1 application topically 2 (two) times daily. (Patient not taking: Reported on 01/27/2017), Disp: 15 g, Rfl: 2 .  nystatin (NYSTATIN) powder, Apply to affected area twice daily until healed. (Patient not taking: Reported on 01/27/2017), Disp: 30 g, Rfl: 2 .  nystatin cream (MYCOSTATIN), APPLY UNDER ABDOMINAL SKIN AND ON BUTTOCKS 2 TIMES A DAY UNTIL RASH RESOLVES (Patient not taking: Reported on 01/27/2017), Disp: 30 g, Rfl: 0 .  ondansetron (ZOFRAN ODT) 8 MG disintegrating tablet, Take 1 tablet (8 mg total) by mouth every 8 (eight) hours as needed for nausea or vomiting. (Patient not taking: Reported on 01/27/2017), Disp: 10 tablet, Rfl: 0 .  oxyCODONE-acetaminophen (PERCOCET) 10-325 MG tablet, Take 1 tablet by mouth every 8 (eight)  hours as needed for pain. (Patient not taking: Reported on 01/27/2017), Disp: 90 tablet, Rfl: 0 .  pantoprazole (PROTONIX) 40 MG tablet, Take 1 tablet (40 mg total) by mouth daily. (Patient not taking: Reported on 01/27/2017), Disp: 30 tablet, Rfl: 2 .  polyethylene glycol (MIRALAX / GLYCOLAX) packet, MIX 1 PACKET IN 8OZ OF LIQUID EVERY DAY (Patient not taking: Reported on 01/27/2017), Disp: 14 each, Rfl: 0 .  solifenacin (VESICARE) 10 MG tablet, Take 10 mg by mouth daily., Disp: , Rfl:  .  spironolactone (ALDACTONE) 50 MG tablet, TAKE ONE TABLET BY MOUTH EVERY DAY (Patient not taking: Reported on 01/27/2017), Disp: 90 tablet, Rfl: 1 .  triamcinolone (KENALOG) 0.025 % ointment, APPLY TOPICALLY TO LEFT ARM 2 TIMES A DAY UNTIL RASH AND ITCHING RESOLVES (Patient not taking: Reported on 01/27/2017), Disp: 30 g, Rfl: 0 .  UNIFINE PENTIPS 31G X 5 MM MISC, USE TO INJECT INSULIN TWO TIMES DAILY (Patient not taking: Reported on 01/27/2017), Disp: 100 each, Rfl: 0 .  Vitamin D, Ergocalciferol, (DRISDOL) 50000 units CAPS capsule, TAKE ONE CAPSULE BY MOUTH EVERY MONDAY (Patient not taking: Reported on 01/27/2017), Disp: 4 capsule, Rfl: 0 .  VOLTAREN 1 % GEL, APPLY 2 GM TOPICALLY FOUR TIMES  DAILY AS NEEDED FOR PAIN (Patient not taking: Reported on 01/27/2017), Disp: 300 g, Rfl: 0 Allergies  Allergen Reactions  . Ace Inhibitors Anaphylaxis    Swelling of the tongue and throat   . Clindamycin Anaphylaxis and Swelling  . Enalapril Maleate Anaphylaxis    Swelling of the tongue and throat   . Lisinopril Anaphylaxis    Swelling of the tongue and throat      Social History   Social History  . Marital status: Single    Spouse name: N/A  . Number of children: 2  . Years of education: 9th grade   Occupational History  . Unemployed     planning on getting disability   Social History Main Topics  . Smoking status: Never Smoker  . Smokeless tobacco: Never Used  . Alcohol use No  . Drug use: No  . Sexual  activity: Not on file   Other Topics Concern  . Not on file   Social History Narrative   Lives in [place] with her son. Is able to read and write fluently in [place].    Physical Exam  Eyes: Pupils are equal, round, and reactive to light.  Pulmonary/Chest: No respiratory distress. She has no wheezes. She has no rales.  Musculoskeletal: She exhibits no edema.  Skin: Skin is warm and dry. She is not diaphoretic.        SAFE - 01/27/17 1000      Situation   Admitting diagnosis (P)  chf   Heart failure history (P)  Exisiting   Readmitted within 30 days (P)  No   Hospital admission within past 12 months (P)  Yes   number of hospital admissions (P)  1   number of ED visits (P)  1   Target Weight (P)  200 lbs     Assessment   Lives alone (P)  Yes   Primary support person (P)  daughter [name]   Mode of transportation (P)  personal car   Other services involved (P)  Other   Home equipement (P)  Cane;Scale;Walker     Weight   Weighs self daily (P)  Yes   Scale provided (P)  Yes   Records on weight chart (P)  Yes     Resources   Has "Living better w/heart failure" book (P)  Yes   Has HF Zone tool (P)  Yes   Able to identify yellow zone signs/when to call MD (P)  Yes   Records zone daily (P)  Yes     Medications   Uses a pill box (P)  Yes   Who stocks the pill box (P)  home aide   Pill box checked this visit (P)  Yes   Pill box refilled this visit (P)  No   Difficulty obtaining medications (P)  No   Mail order medications (P)  Yes   New medications at home today (P)  No   Which ones (P)  n   Next scheduled delivery of medications (P)  02/01/17   Missed one or more doses of medications per week (P)  Yes         Community Paramedic Living Environment - 01/27/17 1000      Outside of House   Sidewalk and pathway to house is level and free from any hazards Yes   Driveway is free from debris/snow/ice Yes   Outside stairs are stable and have sturdy handrail  Yes   Porch lights are working and provide  adequate lighting Yes     Living Room   Furniture is of adequate height and offers arm rests that assist in getting up and down Yes   Floor is free from any clutter that would create tripping hazards Yes   All cords are either behind furniture or secured in a manner that does not cause trip hazards Yes   All rugs are secured to floor with double-sided tape N/A   Lighting is adequate to light room Yes   All lighting has an easily accessible on/off switch Yes   Phone is readily accessible near favorite seating areas Yes   Emergency numbers are printed near all phones in house Yes     Kitchen   Items used most often are within easy reach on low shelves Yes   Step stool is present, is sturdy and has a handrail N/A   Floor mats are non-slip tread and secured to floor N/A   Oven controls are within easy reach Yes   Kitchen lighting is adequate and easy to reach switches Yes   ABC fire extinguisher is located in kitchen N/A     Stairs   Carpet is properly secured to stairs and/or all wood is properly secured N/A   Handrail is present and sturdy Yes   Stairs are free from any clutter N/A   Stairway is adequately lit N/A     Bathroom   Tub and shower have a non-slip surface Yes   Tub and/or shower have a grab bar for stability Yes   Toilet has a raised seat No   Grab bar is attached near toilet for assistance Yes   Pathway from bedroom to bathroom is free from clutter and well lit for ease of movement in the middle of the night Yes     Bedroom   Floor is free from clutter Yes   Light is near bed and is easy to turn on Yes   Phone is next to bed and within easy reach Yes   Flashlight is near bed in case of emergency No     General   Smoke detectors in all areas of the house (each floor) and tested Yes   CO detectors on each floor of house and tested Yes   Flashlights are handy throughout the home No   Resident has all medical information  readily available and in an area emergency providers will easily find Yes   All heaters are away from any type of flammable material Yes     Overall Tips   Homeowner ha good non-skid shoes to move around house Yes   All assisted walking devices are readily accessible and in good condition Yes   There is a phone near the floor for ease of reach in case of a fall YES   All O2 tubing is less than 50 ft. and is not a trip hazard No   Resident has had an annual hearing and vision check by a physician Yes   Resident has the proper hearing and visual aids prescribed and are in good working order No   All medications are properly stored and labeled to avoid confusion on dosage, time to take, and avoidance of missed doses Yes       Future Appointments Date Time Provider Department Center  01/31/2017 9:00 AM MC ECHO 1-BUZZ MC-ECHOLAB [facility]  01/31/2017 10:20 AM MC-HVSC CLINIC MC-HVSC None  02/01/2017 11:40 AM Ranelle Oyster, MD CPR-PRMA CPR  02/21/2017 1:15 PM Alm Bustard,  MD IMP-IMCR [facility]  02/21/2017 2:00 PM Chauncey Reading Plyler, RD IMP-IMCR [facility]  05/03/2017 11:00 AM Silverio Decamp, MD PCK-PCK None   ATF pt CAO x4 with no complaints today. Pt denies SOB, dizziness, headaches, and chest pain. Pt stated that her weight was taken yesterday around 11am which is 4 lbs heaviered today. Pt missed one rx dose yesterday.  Pt stated that "she is watching her fluid intake and has no issues with her sodium diet".Pt has another home aide who will not be able to fill pt's pill box, therefore [facility] will begin refilling pt's pill box next visit.  Pt's rx bottles and pill box verified during visit.  Heart clinic notified of pt's weight increase via VM @ 10:45. cbg 204  BP (!) 122/96 (BP Location: Right Arm, Patient Position: Sitting, Cuff Size: Large)   Pulse (!) 114   Resp 16   Wt 244 lb (110.7 kg)   SpO2 98%   BMI 36.03 kg/m   Weight yesterday-240 Last visit weight-240    ACTION: Home visit  completed Next visit planned for next friday

## 2017-01-27 NOTE — Telephone Encounter (Signed)
Dee with Paramed called in reporting patient having a weight gain of 4 lbs overnight but believes patient's weight from the day before was taking inaccurately by the patient.  Patient is asymptomatic.  Geraldine Contras stated she educated patient on properly weighing herself and said she will recheck with patient tomorrow regarding weight.

## 2017-01-31 ENCOUNTER — Other Ambulatory Visit (HOSPITAL_COMMUNITY): Payer: Self-pay

## 2017-01-31 ENCOUNTER — Ambulatory Visit (HOSPITAL_BASED_OUTPATIENT_CLINIC_OR_DEPARTMENT_OTHER)
Admission: RE | Admit: 2017-01-31 | Discharge: 2017-01-31 | Disposition: A | Discharge status: Home / Self Care | Payer: Medicaid Other | Source: Ambulatory Visit | Attending: Cardiology | Admitting: Cardiology

## 2017-01-31 ENCOUNTER — Ambulatory Visit (HOSPITAL_COMMUNITY)
Admission: RE | Admit: 2017-01-31 | Discharge: 2017-01-31 | Disposition: A | Discharge status: Home / Self Care | Payer: Medicaid Other | Source: Ambulatory Visit | Attending: Student | Admitting: Student

## 2017-01-31 VITALS — BP 124/82 | HR 90 | Wt 243.0 lb

## 2017-01-31 DIAGNOSIS — I5042 Chronic combined systolic (congestive) and diastolic (congestive) heart failure: Secondary | ICD-10-CM | POA: Insufficient documentation

## 2017-01-31 DIAGNOSIS — E7849 Other hyperlipidemia: Secondary | ICD-10-CM

## 2017-01-31 DIAGNOSIS — I10 Essential (primary) hypertension: Secondary | ICD-10-CM

## 2017-01-31 DIAGNOSIS — I110 Hypertensive heart disease with heart failure: Secondary | ICD-10-CM | POA: Insufficient documentation

## 2017-01-31 DIAGNOSIS — I509 Heart failure, unspecified: Secondary | ICD-10-CM | POA: Diagnosis present

## 2017-01-31 DIAGNOSIS — I480 Paroxysmal atrial fibrillation: Secondary | ICD-10-CM

## 2017-01-31 DIAGNOSIS — E785 Hyperlipidemia, unspecified: Secondary | ICD-10-CM | POA: Diagnosis not present

## 2017-01-31 DIAGNOSIS — E784 Other hyperlipidemia: Secondary | ICD-10-CM

## 2017-01-31 LAB — LIPID PANEL
Cholesterol: 112 mg/dL (ref 0–200)
HDL: 40 mg/dL — ABNORMAL LOW (ref >40)
LDL CALC: 47 mg/dL (ref 0–99)
Total CHOL/HDL Ratio: 2.8 RATIO
Triglycerides: 127 mg/dL (ref <150)
VLDL: 25 mg/dL (ref 0–40)

## 2017-01-31 LAB — BASIC METABOLIC PANEL
Anion gap: 9 (ref 5–15)
CALCIUM: 9.4 mg/dL (ref 8.9–10.3)
CO2: 27 mmol/L (ref 22–32)
CREATININE: 0.63 mg/dL (ref 0.44–1.00)
Chloride: 101 mmol/L (ref 101–111)
GFR calc Af Amer: >60 mL/min (ref >60)
GLUCOSE: 193 mg/dL — AB (ref 65–99)
POTASSIUM: 3.8 mmol/L (ref 3.5–5.1)
SODIUM: 137 mmol/L (ref 135–145)

## 2017-01-31 LAB — BRAIN NATRIURETIC PEPTIDE: B NATRIURETIC PEPTIDE 5: 20 pg/mL (ref 0.0–100.0)

## 2017-01-31 MED ORDER — RIVAROXABAN 20 MG PO TABS: 20.0000 mg | ORAL_TABLET | Freq: Every day | ORAL | 3 refills | Status: DC

## 2017-01-31 MED ORDER — FUROSEMIDE 40 MG PO TABS: 40.0000 mg | ORAL_TABLET | ORAL | 2 refills | Status: DC

## 2017-01-31 NOTE — Progress Notes (Signed)
  Echocardiogram 2D Echocardiogram has been performed.  [name] L Androw 01/31/2017, 10:11 AM

## 2017-01-31 NOTE — Progress Notes (Signed)
Paramedicine Encounter  Patient ID: [name] , female,   DOB: 09-16-1966,50 y.o.,  MRN: [id number]   Patient is seen in the clinic today with Dr. Shirlee Latch. She has an echo performed and is told her EF has improved. Patient reports feeling "great" lately and she is not feeling SOB with walking. She reports sleeping with less pillows, now sleeping with 4 down from 9. Ms. Silverstein is taking her lasix PRN and has not been taking it very often lately. Dr. Shirlee Latch has instructed her to begin taking Lasix Monday and Friday. Dr. Shirlee Latch instructed her to stop taking ASA and begin taking Xarelto. Time spent with patient 40 min.  Weight Today- 240 lb Weight yesterday-240 lb Last visit weight-244 lb  Jacqualine Code, EMT-Paramedic Kerry Hough, EMT-Paramedic  01/31/2017

## 2017-01-31 NOTE — Progress Notes (Signed)
Patient ID: [name], female   DOB: 1966/01/05, 51 y.o.   MRN: [id number]    Advanced Heart Failure Clinic Note   EP: Dr Caryl Comes PCP: Dr [facility] Primary HF Cardiologist: Dr Aundra Dubin   Angioedema with ACE-I  --Optivol--  HPI: 51 yo with history of morbid obesity, HTN, HLD, CAD, DM2, OSA on CPAP, CVA and chronic systolic CHF in the setting of poorly-controlled HTN. She was admitted in 05/2011 with chest pain and new LBBB and ended up getting a left heart cath, which showed no angiographic CAD. Renal artery dopplers were normal in 04/2012.  She has a Medtronic CRT-D device.   Was hospitalized May 2014 with acute on chronic combined systolic and diastolic CHF. EF from December 2013 was down to 40 to 45%.   Admitted in January 2015 with slurred speech, L arm weakness and facial droop. Initial head CT showed no acute abnormality. Repeat head CT showed subtle gray white matter differentiation in R frontal lobe and R operculum perhaps representing ischemic R sided MCA. STAT repeat CT head later on 1/29 showed further evolution of R MCA territory infarct involving R insular cortex and operculum. No hemorrhage. Neurology recommended CTA head/neck, which showed occlusion of MCA branch to the area of R MCA infarct and a normal neck CTA. TEE was done and did not reveal embolic source. Was discharged to inpatient rehab.  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  She was started on Keppra.  She went to the ER in 2/16 with syncope that was probably due to seizure, no arrhythmias with device interrogation.  Echo in 11/16 showed EF back down to 25-30%.    She has been limited since her stroke, walks in house with cane and uses wheelchair for long distances. No dyspnea walking in house.  She sleeps on 4 pillows now, used to sleep on 9.  No PND.  No chest pain.  No lightheadedness or palpitations.  She is using CPAP.  Echo today showed improvement  in EF to 45%.   Device interrogation: >99% BiV pacing, fluid index trending up, impedance trending down.  She had a run of atrial fibrillation in 11/17.   ECHO 11/2012: 40-45% ECHO  07/2013: 20% RV normal ECHO    08/23/2013: EF 40-45%, diff HK ECHO   09/05/2013: EF 25-30%, diff HK  ECHO  12/11/13: EF 30%, diff HK, RV normal ECHO 01/14/2014: EF 35-40%, LA mod dilated  ECHO 4/16: EF 40-45%, moderate LVH, normal RV size with mildly decreased systolic function.   ECHO 11/16: EF 25-30%, moderate LVH ECHO 2/18: EF 45%, moderate LVH, normal RV size and systolic function.   Labs 06/2013: proBNP 230, Cr 0.9, K 3.7          08/2013: K+ 3.9, Cr 0.9       09/2013: K 4.2, Cr 0.86, pro-BNP 718, dig level <0.3     10/07/13: K+ 4.1, Cr 0.83       05/08/14: K 4.0 Creatinine 0.67 Pro BNP 233 Dig level 0.3        08/20/2014 K 3.8 nCreatinine 0.56        2/16: K 3.6, creatinine 0.95, LDL 106, HDL 43       3/16: K 3.5, creatinine 0.87, HCT 35.9       7/16: K 3.5, creatinine 0.78. HCT 37       10/16: digoxin 0.2, LDL 116       1/17: K  3.6, creatinine 0.77, HCT 38       5/17: digoxin 0.9       8/17: K 4.2, creatinine 0.68, HCT 35.7       11/17: K 3.8, creatinine 0.76  ROS: All systems negative except as listed in HPI, PMH and Problem List.  Past Medical History:  Diagnosis Date  . Allergic rhinitis   . Arthritis    "hips, back, legs, arms" (07/04/2014)  . Asthma    hx  . Automatic implantable cardioverter-defibrillator in situ   . Calcifying tendinitis of shoulder   . Chronic combined systolic and diastolic CHF (congestive heart failure) ([place])    EF 40-45% by echo 12/06/2012  . Chronic diastolic heart failure (HCC)     Primarily diastolic CHF: Likely due to uncontrolled HTN. Last echo (8/12) with EF 45-50%, mild to moderate LVH with some asymmetric septal hypertrophy, RV normal size and systolic function. EF 50-55% by LV-gram in 6/12.   . Chronic lower back pain    secondary to DJD, obsetiy, hip problems.  Followed by Dr. Oval Linsey (pain management)  . Coronary artery disease    questionable. LHC 05/2011 showing normal coronaries // Followed at [facility], Dr. Aundra Dubin  . Degeneration of lumbar or lumbosacral intervertebral disc   . DJD (degenerative joint disease) of hip    right sided  . Frequent UTI   . GERD (gastroesophageal reflux disease)   . HLD (hyperlipidemia)   . Hypertension    Poorly controlled. Has had HTN since age 71. Angioedema with ACEI.  24 Hr urine and renal arterial dopplers ordered . . . Never done  . LBBB (left bundle branch block)   . Liver disease   . Morbid obesity ([place])   . NICM (nonischemic cardiomyopathy) ([place])    EF 45-50% in 8/12, cath 6/12 showed normal coronaries, EF 50-55% by LV gram  . OSA on CPAP    sleep study in 8/12 showed moderate to severe OSA requiring CPAP  . Polyneuropathy in diabetes(357.2)   . Presence of permanent cardiac pacemaker   . Seizures ([place])    last 3 months  . Shortness of breath    none now  . Stroke [facility]) 12/2013   "my left side is paralyzed" (07/04/2014)  . Thoracic or lumbosacral neuritis or radiculitis, unspecified   . Type II diabetes mellitus ([place]) DX: 2002    Current Outpatient Prescriptions  Medication Sig Dispense Refill  . ACCU-CHEK FASTCLIX LANCETS MISC Check blood sugar 3 times a day 102 each 12  . ACCU-CHEK SMARTVIEW test strip USE TO CHECK BLOOD SUGAR THREE TIMES DAILY 100 each 12  . amLODipine (NORVASC) 10 MG tablet TAKE 1 TABLET BY MOUTH EVERY DAY 30 tablet 6  . atorvastatin (LIPITOR) 40 MG tablet TAKE 1 TABLET BY MOUTH EVERY DAY 30 tablet 3  . Blood Glucose Monitoring Suppl (ACCU-CHEK GUIDE) w/Device KIT 1 each by Does not apply route 3 (three) times daily. 1 kit 1  . calamine lotion Apply 1 application topically as needed for itching. 120 mL 0  . citalopram (CELEXA) 20 MG tablet TAKE 1 TABLET BY MOUTH EVERY DAY 90 tablet 2  . cyclobenzaprine (FLEXERIL) 10 MG tablet TAKE ONE TABLET BY MOUTH 3 TIMES A  DAY IF NEEDED FOR MUSCLE SPASMS 90 tablet 1  . DIGOX 125 MCG tablet TAKE ONE TABLET BY MOUTH EVERY DAY 30 tablet 11  . docusate sodium (COLACE) 50 MG capsule Take 1 capsule (50 mg total) by mouth 2 (two) times  daily as needed for mild constipation. 60 capsule 6  . fluticasone (CUTIVATE) 0.05 % cream Apply topically 2 (two) times daily. Dispense two tubes 30 g 6  . furosemide (LASIX) 40 MG tablet Take 1 tablet (40 mg total) by mouth 2 (two) times a week. Every Monday and Friday 10 tablet 2  . gabapentin (NEURONTIN) 300 MG capsule Take 1 capsule (300 mg total) by mouth 2 (two) times daily. 60 capsule 2  . hydrALAZINE (APRESOLINE) 50 MG tablet Take 1.5 tablets (75 mg total) by mouth 3 (three) times daily. 135 tablet 2  . Insulin Glargine (LANTUS SOLOSTAR) 100 UNIT/ML Solostar Pen Inject 40 Units into the skin daily at 10 pm. Inject 45 units in the PM. 45 mL 3  . isosorbide mononitrate (IMDUR) 60 MG 24 hr tablet TAKE 1 & 1/2 TABLETS BY MOUTH EVERY DAY 45 tablet 2  . levETIRAcetam (KEPPRA) 500 MG tablet TAKE 1 TABLET BY MOUTH TWO TIMES DAILY **MUST NOT MISS ANY DOSE** 180 tablet 1  . metFORMIN (GLUCOPHAGE XR) 500 MG 24 hr tablet Take 1 tablet (500 mg total) by mouth 2 (two) times daily. 60 tablet 11  . metoprolol succinate (TOPROL-XL) 100 MG 24 hr tablet TAKE 1 TABLET BY MOUTH TWO TIMES DAILY 60 tablet 2  . mupirocin cream (BACTROBAN) 2 % Apply 1 application topically 2 (two) times daily. 15 g 2  . nystatin (NYSTATIN) powder Apply to affected area twice daily until healed. 30 g 2  . nystatin cream (MYCOSTATIN) APPLY UNDER ABDOMINAL SKIN AND ON BUTTOCKS 2 TIMES A DAY UNTIL RASH RESOLVES 30 g 0  . ondansetron (ZOFRAN ODT) 8 MG disintegrating tablet Take 1 tablet (8 mg total) by mouth every 8 (eight) hours as needed for nausea or vomiting. 10 tablet 0  . oxyCODONE-acetaminophen (PERCOCET) 10-325 MG tablet Take 1 tablet by mouth every 8 (eight) hours as needed for pain. 90 tablet 0  . pantoprazole (PROTONIX)  40 MG tablet Take 1 tablet (40 mg total) by mouth daily. 30 tablet 2  . polyethylene glycol (MIRALAX / GLYCOLAX) packet MIX 1 PACKET IN 8OZ OF LIQUID EVERY DAY 14 each 0  . solifenacin (VESICARE) 10 MG tablet Take 10 mg by mouth daily.    [name] spironolactone (ALDACTONE) 50 MG tablet TAKE ONE TABLET BY MOUTH EVERY DAY 90 tablet 1  . triamcinolone (KENALOG) 0.025 % ointment APPLY TOPICALLY TO LEFT ARM 2 TIMES A DAY UNTIL RASH AND ITCHING RESOLVES 30 g 0  . UNIFINE PENTIPS 31G X 5 MM MISC USE TO INJECT INSULIN TWO TIMES DAILY 100 each 0  . Vitamin D, Ergocalciferol, (DRISDOL) 50000 units CAPS capsule TAKE ONE CAPSULE BY MOUTH EVERY MONDAY 4 capsule 0  . VOLTAREN 1 % GEL APPLY 2 GM TOPICALLY FOUR TIMES DAILY AS NEEDED FOR PAIN 300 g 0  . rivaroxaban (XARELTO) 20 MG TABS tablet Take 1 tablet (20 mg total) by mouth daily with supper. 30 tablet 3   No current facility-administered medications for this encounter.    Vitals:   01/31/17 1019  BP: 124/82  BP Location: Right Arm  Patient Position: Sitting  Cuff Size: Large  Pulse: 90  SpO2: 99%  Weight: 243 lb (110.2 kg)    Wt Readings from Last 3 Encounters:  01/31/17 243 lb (110.2 kg)  01/27/17 244 lb (110.7 kg)  11/22/16 244 lb 11.2 oz (111 kg)     PHYSICAL EXAM: General:  NAD. No resp difficulty; Neck: supple. Thick. JVP 7-8 cm. Carotids 2+ bilaterally;  no bruits. Thyroid symmetrically enlarged.  Cor: PMI nonpalpable; RRR. No rubs, gallops or murmurs. No S3/S4.  Lungs: clear Abdomen: obese, soft, NT, ND, no HSM. No bruits or masses. +BS  Extremities: no cyanosis, clubbing, rash, No ankle edema.  Neuro: alert & orientedx3, cranial nerves grossly intact. Moves all 4 extremities w/o difficulty. Affect pleasant.  ASSESSMENT/PLAN: 1. Chronic systolic HF: Nonischemic cardiomyopathy, normal coronaries on 6/12 angiography.  Cardiomyopathy may be due to long-standing and poorly-controlled HTN.  Echo 11/16 EF 25-30%, echo 2/18 with EF improved to  45%.  S/P CRT-D Medtronic.  NYHA class II-III symptoms, Volume status looks ok on exam but appears to be developing volume overload based on Optivol.   - She has not been taking Lasix.  She needs to go back to taking it twice weekly.  BMET/BNP today.  - Continue current spironolactone, digoxin, and Toprol XL.  Check digoxin level today.   - Continue hydralazine 75 mg tid and Imdur 90 mg daily.   - No ACE-I/ARB/ARNI due to angioedema.   2. HTN: BP appears controlled. 3. Morbid obesity: Exercise limited. Needs to be more active.   4. OSA on CPAP:  Using CPAP.   5. Atrial fibrillation: Episode of atrial fibrillation in 11/17 noted on device interrogation. Patient had a CVA in the past.  CHADSVASC = 5.  I am going to have her stop ASA and start Xarelto 20 mg daily.   Loralie Champagne  01/31/2017

## 2017-01-31 NOTE — Patient Instructions (Signed)
Stop Aspirin  Start Xarelto 20 mg daily  Take Furosemide 40 mg every Monday and Friday  Labs today  Your physician recommends that you schedule a follow-up appointment in: 3 months

## 2017-02-01 ENCOUNTER — Encounter: Payer: Medicaid Other | Attending: Physical Medicine & Rehabilitation | Admitting: Physical Medicine & Rehabilitation

## 2017-02-01 ENCOUNTER — Encounter: Payer: Self-pay | Admitting: Physical Medicine & Rehabilitation

## 2017-02-01 DIAGNOSIS — I5042 Chronic combined systolic (congestive) and diastolic (congestive) heart failure: Secondary | ICD-10-CM | POA: Diagnosis present

## 2017-02-01 DIAGNOSIS — E1342 Other specified diabetes mellitus with diabetic polyneuropathy: Secondary | ICD-10-CM | POA: Diagnosis present

## 2017-02-01 DIAGNOSIS — I63319 Cerebral infarction due to thrombosis of unspecified middle cerebral artery: Secondary | ICD-10-CM | POA: Diagnosis present

## 2017-02-01 DIAGNOSIS — Z5181 Encounter for therapeutic drug level monitoring: Secondary | ICD-10-CM | POA: Insufficient documentation

## 2017-02-01 DIAGNOSIS — G629 Polyneuropathy, unspecified: Secondary | ICD-10-CM | POA: Diagnosis present

## 2017-02-01 DIAGNOSIS — Z79899 Other long term (current) drug therapy: Secondary | ICD-10-CM | POA: Diagnosis present

## 2017-02-01 DIAGNOSIS — G8110 Spastic hemiplegia affecting unspecified side: Secondary | ICD-10-CM | POA: Diagnosis present

## 2017-02-01 DIAGNOSIS — G8114 Spastic hemiplegia affecting left nondominant side: Secondary | ICD-10-CM

## 2017-02-01 DIAGNOSIS — M47817 Spondylosis without myelopathy or radiculopathy, lumbosacral region: Secondary | ICD-10-CM | POA: Insufficient documentation

## 2017-02-01 MED ORDER — OXYCODONE-ACETAMINOPHEN 10-325 MG PO TABS: 1.0000 | ORAL_TABLET | Freq: Three times a day (TID) | ORAL | 0 refills | Status: DC | PRN

## 2017-02-01 NOTE — Progress Notes (Signed)
Subjective:    Patient ID: [name], female    DOB: 13-Apr-1966, 51 y.o.   MRN: [id number]  HPI   [name] is here in follow up of her chronic spastic left hemiparesis. She had good results with the botox again in December. She has developed more left elbow tightness however (we didn't inject there). Her left thumb especially has improved. She has also notice plantarflexion tightness and some inversion of her foot. It makes it more difficult to walk and to fit in her AFO as well.   She continues on her percocet for pain contrl which provides some relief.   Her BP has been under better control. She continues to work on weight loss and better DM control as well.   Pain Inventory Average Pain 7 Pain Right Now 7 My pain is constant and aching  In the last 24 hours, has pain interfered with the following? General activity 7 Relation with others 7 Enjoyment of life 7 What TIME of day is your pain at its worst? morning Sleep (in general) Fair  Pain is worse with: some activites Pain improves with: medication Relief from Meds: not answered  Mobility use a cane how many minutes can you walk? 8 ability to climb steps?  yes do you drive?  yes  Function disabled: date disabled . I need assistance with the following:  dressing, bathing, meal prep and household duties  Neuro/Psych bladder control problems bowel control problems  Prior Studies Any changes since last visit?  no  Physicians involved in your care Any changes since last visit?  no   Family History  Problem Relation Age of Onset  . Heart disease Mother 71    Died of MI at age 78 yo  . Kidney disease Mother     requiring dialysis  . Congestive Heart Failure Mother   . Heart disease Father 20    MI age 20yo requiring stenting  . Diabetes Father   . Glaucoma Father   . Heart disease Paternal Grandmother     requiring pacemaker.  [name] Heart disease Paternal Grandfather 57    Died of MI at possibly age  56-53yo  . Stroke Paternal Grandfather   . Diabetes Brother   . Heart disease Brother 26    MI at age 92 years old  . Breast cancer Paternal Aunt   . Breast cancer Maternal Grandmother    Social History   Social History  . Marital status: Single    Spouse name: N/A  . Number of children: 2  . Years of education: 9th grade   Occupational History  . Unemployed     planning on getting disability   Social History Main Topics  . Smoking status: Never Smoker  . Smokeless tobacco: Never Used  . Alcohol use No  . Drug use: No  . Sexual activity: Not Asked   Other Topics Concern  . None   Social History Narrative   Lives in [place] with her son. Is able to read and write fluently in [place].   Past Surgical History:  Procedure Laterality Date  . BI-VENTRICULAR IMPLANTABLE CARDIOVERTER DEFIBRILLATOR N/A 08/22/2013   Procedure: BI-VENTRICULAR IMPLANTABLE CARDIOVERTER DEFIBRILLATOR  (CRT-D);  Surgeon: [name], MD;  Location: [facility] CATH LAB;  Service: Cardiovascular;  Laterality: N/A;  . BI-VENTRICULAR IMPLANTABLE CARDIOVERTER DEFIBRILLATOR  (CRT-D)  08/2013   [name] 08/23/2013  . BREAST SURGERY Bilateral 2011   patient reports benign results  . CARDIAC CATHETERIZATION  05/2011  .  CARPAL TUNNEL RELEASE Left    denies  . HEMIARTHROPLASTY SHOULDER FRACTURE Right 1980's   denies  . MULTIPLE TOOTH EXTRACTIONS  ~ 2011   tumors removed ; "my whole top"  . SHOULDER ARTHROSCOPY Right 12/26/2015   Procedure: Right Shoulder Arthroscopy, Debridement, and Decompression;  Surgeon: [name], MD;  Location: [facility] OR;  Service: Orthopedics;  Laterality: Right;  . TEE WITHOUT CARDIOVERSION N/A 01/14/2014   Procedure: TRANSESOPHAGEAL ECHOCARDIOGRAM (TEE);  Surgeon: Thurmon Fair, MD;  Location: [facility] ENDOSCOPY;  Service: Cardiovascular;  Laterality: N/A;  . TUBAL LIGATION  05/31/1985   Past Medical History:  Diagnosis Date  . Allergic rhinitis   . Arthritis    "hips, back, legs, arms"  (07/04/2014)  . Asthma    hx  . Automatic implantable cardioverter-defibrillator in situ   . Calcifying tendinitis of shoulder   . Chronic combined systolic and diastolic CHF (congestive heart failure) (HCC)    EF 40-45% by echo 12/06/2012  . Chronic diastolic heart failure (HCC)     Primarily diastolic CHF: Likely due to uncontrolled HTN. Last echo (8/12) with EF 45-50%, mild to moderate LVH with some asymmetric septal hypertrophy, RV normal size and systolic function. EF 50-55% by LV-gram in 6/12.   . Chronic lower back pain    secondary to DJD, obsetiy, hip problems. Followed by Dr. Ivory Broad (pain management)  . Coronary artery disease    questionable. LHC 05/2011 showing normal coronaries // Followed at [facility], Dr. Shirlee Latch  . Degeneration of lumbar or lumbosacral intervertebral disc   . DJD (degenerative joint disease) of hip    right sided  . Frequent UTI   . GERD (gastroesophageal reflux disease)   . HLD (hyperlipidemia)   . Hypertension    Poorly controlled. Has had HTN since age 9. Angioedema with ACEI.  24 Hr urine and renal arterial dopplers ordered . . . Never done  . LBBB (left bundle branch block)   . Liver disease   . Morbid obesity (HCC)   . NICM (nonischemic cardiomyopathy) (HCC)    EF 45-50% in 8/12, cath 6/12 showed normal coronaries, EF 50-55% by LV gram  . OSA on CPAP    sleep study in 8/12 showed moderate to severe OSA requiring CPAP  . Polyneuropathy in diabetes(357.2)   . Presence of permanent cardiac pacemaker   . Seizures (HCC)    last 3 months  . Shortness of breath    none now  . Stroke [facility]) 12/2013   "my left side is paralyzed" (07/04/2014)  . Thoracic or lumbosacral neuritis or radiculitis, unspecified   . Type II diabetes mellitus (HCC) DX: 2002   BP 135/87   Pulse 98   SpO2 98%   Opioid Risk Score:   Fall Risk Score:  `1  Depression screen PHQ 2/9  Depression screen [facility] 2/9 02/01/2017 11/22/2016 09/10/2016 07/28/2016 07/14/2016  04/29/2016 03/05/2016  Decreased Interest 0 0 0 0 0 0 0  Down, Depressed, Hopeless 0 0 0 0 - 0 0  PHQ - 2 Score 0 0 0 0 0 0 0  Altered sleeping - - - - - - -  Tired, decreased energy - - - - - - -  Change in appetite - - - - - - -  Feeling bad or failure about yourself  - - - - - - -  Trouble concentrating - - - - - - -  Moving slowly or fidgety/restless - - - - - - -  Suicidal  thoughts - - - - - - -  PHQ-9 Score - - - - - - -  Some recent data might be hidden    Review of Systems  Constitutional: Negative.   HENT: Negative.   Eyes: Negative.   Respiratory: Negative.   Cardiovascular: Negative.   Gastrointestinal: Negative.   Endocrine: Negative.   Genitourinary: Negative.   Musculoskeletal: Negative.   Skin: Negative.   Allergic/Immunologic: Negative.   Neurological: Negative.   Hematological: Negative.   Psychiatric/Behavioral: Negative.   All other systems reviewed and are negative.      Objective:   Physical Exam  Constitutional: She is oriented to person, place, and time. obese HENT:  Head: Normocephalic and atraumatic.  Neck: Normal range of motion. Neck supple.  Cardiovascular: RRR  Pulmonary/Chest: CTA .  Musculoskeletal: edema tr LUE Normal Muscle Bulk  Right knee with crepitus and mild effusion Left: Hemiparesis/ 1-2/5 UE,  1-2/5 LE Right Thoracic Paraspinal Tenderness: T-1- T-3 Lumbar Paraspinal Tenderness: L-3- L-5 Lower Extremities: Right: Full ROM and Muscle Strength 5/5 Left: Hemiparesis. Tone 2/4 left finger flexors/wrist/forearm pronators. Bicep/BR are 2-3/4, PF/INV 2/4.  Neurological: She is alert and oriented to person, place, and time.  Skin: Skin is warm and dry.  Psychiatric: She has a normal mood and affect.           Assessment & Plan:  1. Chronic low back pain: Continue Current Medication Regime. Refilled: oxyCODONE 10/325mg  one tablet every 8 hours as needed.#90 was refilled today We will continue the opioid  monitoring program, this consists of regular clinic visits, examinations, urine drug screen, pill counts as well as use of [place] Controlled Substance Reporting System.   2. Diabetic peripheral neuropathy:   -gabapentin  3. 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 Recommend resting WHO at night and towel in palm during the day.  -elevate LUE when in chair or bed. Consider evening ACE wrap also.  -arrange botox 400u left biceps/brachioradialis/finger flexors in one month -phenol injection left post tibial nerve in 2 months.  4. OA: Continue Voltaren gel  5. CHF and HTN: -she has lost weight and bp and edema has improved! 6. Diabetes control per primary---improving   15 minutes of face to face patient care time was spent during this visit. All questions were encouraged and answered. FOllow up in a month for botox

## 2017-02-01 NOTE — Patient Instructions (Signed)
STRETCH DAILY TO WHERE YOU FEEL A PULL OR SOME PAIN!!!!

## 2017-02-08 ENCOUNTER — Other Ambulatory Visit (HOSPITAL_COMMUNITY): Payer: Self-pay

## 2017-02-08 NOTE — Progress Notes (Signed)
Paramedicine Encounter    Patient ID: [name], female    DOB: [birth year], 51 y.o.   MRN: [id number]  Patient Care Team: Alm Bustard, MD as PCP - General  Patient Active Problem List   Diagnosis Date Noted  . Opioid dependence (HCC) 12/14/2016  . Lumbago 11/22/2016  . Vaginal odor 09/10/2016  . Papular eruption 07/14/2016  . Type 2 diabetes mellitus, uncontrolled, with neuropathy (HCC) 02/04/2016  . Contact dermatitis 02/03/2016  . Left spastic hemiparesis (HCC) 09/23/2015  . Constipation 09/09/2015  . Left shoulder pain 06/30/2015  . Localization-related symptomatic epilepsy and epileptic syndromes with complex partial seizures, not intractable, without status epilepticus (HCC) 06/13/2015  . Hemiparesis affecting left side as late effect of cerebrovascular accident (HCC) 06/13/2015  . Morbid obesity (HCC) 06/13/2015  . Ulcer of left foot (HCC) 06/11/2015  . Candidal intertrigo 11/05/2014  . History of seizure 10/14/2014  . Vitamin D deficiency 10/01/2014  . Tachycardia 07/04/2014  . Generalized weakness 07/04/2014  . Perimenopausal menorrhagia 12/20/2013  . Stroke (HCC) 12/13/2013  . Biventricular implantable cardiac defibrillator -Medtronic 11/23/2013  . Mixed stress and urge urinary incontinence 10/11/2013  . Preventative health care 09/13/2013  . Chronic combined systolic and diastolic heart failure (HCC) 08/01/2013  . Nonischemic cardiomyopathy (HCC) 08/01/2013  . Goiter 08/01/2013  . LBBB (left bundle branch block) 07/19/2013  . Depression 04/09/2013  . Primary osteoarthritis of left knee 04/14/2012  . Lumbar spondylosis 04/14/2012  . Diabetic peripheral neuropathy (HCC) 04/14/2012  . Hyperlipidemia 11/23/2011  . OSA (obstructive sleep apnea) 08/27/2011  . Benign essential HTN 08/27/2010    Current Outpatient Prescriptions:  .  ACCU-CHEK FASTCLIX LANCETS MISC, Check blood sugar 3 times a day, Disp: 102 each, Rfl: 12 .  ACCU-CHEK SMARTVIEW test strip,  USE TO CHECK BLOOD SUGAR THREE TIMES DAILY, Disp: 100 each, Rfl: 12 .  DIGOX 125 MCG tablet, TAKE ONE TABLET BY MOUTH EVERY DAY, Disp: 30 tablet, Rfl: 11 .  docusate sodium (COLACE) 50 MG capsule, Take 1 capsule (50 mg total) by mouth 2 (two) times daily as needed for mild constipation., Disp: 60 capsule, Rfl: 6 .  fluticasone (CUTIVATE) 0.05 % cream, Apply topically 2 (two) times daily. Dispense two tubes, Disp: 30 g, Rfl: 6 .  amLODipine (NORVASC) 10 MG tablet, TAKE 1 TABLET BY MOUTH EVERY DAY, Disp: 30 tablet, Rfl: 6 .  atorvastatin (LIPITOR) 40 MG tablet, TAKE 1 TABLET BY MOUTH EVERY DAY, Disp: 30 tablet, Rfl: 3 .  Blood Glucose Monitoring Suppl (ACCU-CHEK GUIDE) w/Device KIT, 1 each by Does not apply route 3 (three) times daily., Disp: 1 kit, Rfl: 1 .  calamine lotion, Apply 1 application topically as needed for itching., Disp: 120 mL, Rfl: 0 .  citalopram (CELEXA) 20 MG tablet, TAKE 1 TABLET BY MOUTH EVERY DAY (Patient not taking: Reported on 02/08/2017), Disp: 90 tablet, Rfl: 2 .  cyclobenzaprine (FLEXERIL) 10 MG tablet, TAKE ONE TABLET BY MOUTH 3 TIMES A DAY IF NEEDED FOR MUSCLE SPASMS, Disp: 90 tablet, Rfl: 1 .  furosemide (LASIX) 40 MG tablet, Take 1 tablet (40 mg total) by mouth 2 (two) times a week. Every Monday and Friday (Patient not taking: Reported on 02/08/2017), Disp: 10 tablet, Rfl: 2 .  gabapentin (NEURONTIN) 300 MG capsule, Take 1 capsule (300 mg total) by mouth 2 (two) times daily. (Patient not taking: Reported on 02/08/2017), Disp: 60 capsule, Rfl: 2 .  hydrALAZINE (APRESOLINE) 50 MG tablet, Take 1.5 tablets (75 mg total) by mouth 3 (three)  times daily., Disp: 135 tablet, Rfl: 2 .  Insulin Glargine (LANTUS SOLOSTAR) 100 UNIT/ML Solostar Pen, Inject 40 Units into the skin daily at 10 pm. Inject 45 units in the PM., Disp: 45 mL, Rfl: 3 .  isosorbide mononitrate (IMDUR) 60 MG 24 hr tablet, TAKE 1 & 1/2 TABLETS BY MOUTH EVERY DAY, Disp: 45 tablet, Rfl: 2 .  levETIRAcetam (KEPPRA) 500  MG tablet, TAKE 1 TABLET BY MOUTH TWO TIMES DAILY **MUST NOT MISS ANY DOSE**, Disp: 180 tablet, Rfl: 1 .  metFORMIN (GLUCOPHAGE XR) 500 MG 24 hr tablet, Take 1 tablet (500 mg total) by mouth 2 (two) times daily. (Patient not taking: Reported on 02/08/2017), Disp: 60 tablet, Rfl: 11 .  metoprolol succinate (TOPROL-XL) 100 MG 24 hr tablet, TAKE 1 TABLET BY MOUTH TWO TIMES DAILY, Disp: 60 tablet, Rfl: 2 .  mupirocin cream (BACTROBAN) 2 %, Apply 1 application topically 2 (two) times daily., Disp: 15 g, Rfl: 2 .  nystatin (NYSTATIN) powder, Apply to affected area twice daily until healed., Disp: 30 g, Rfl: 2 .  nystatin cream (MYCOSTATIN), APPLY UNDER ABDOMINAL SKIN AND ON BUTTOCKS 2 TIMES A DAY UNTIL RASH RESOLVES, Disp: 30 g, Rfl: 0 .  ondansetron (ZOFRAN ODT) 8 MG disintegrating tablet, Take 1 tablet (8 mg total) by mouth every 8 (eight) hours as needed for nausea or vomiting., Disp: 10 tablet, Rfl: 0 .  oxyCODONE-acetaminophen (PERCOCET) 10-325 MG tablet, Take 1 tablet by mouth every 8 (eight) hours as needed for pain., Disp: 90 tablet, Rfl: 0 .  pantoprazole (PROTONIX) 40 MG tablet, Take 1 tablet (40 mg total) by mouth daily., Disp: 30 tablet, Rfl: 2 .  polyethylene glycol (MIRALAX / GLYCOLAX) packet, MIX 1 PACKET IN 8OZ OF LIQUID EVERY DAY, Disp: 14 each, Rfl: 0 .  rivaroxaban (XARELTO) 20 MG TABS tablet, Take 1 tablet (20 mg total) by mouth daily with supper. (Patient not taking: Reported on 02/08/2017), Disp: 30 tablet, Rfl: 3 .  solifenacin (VESICARE) 10 MG tablet, Take 10 mg by mouth daily., Disp: , Rfl:  .  spironolactone (ALDACTONE) 50 MG tablet, TAKE ONE TABLET BY MOUTH EVERY DAY, Disp: 90 tablet, Rfl: 1 .  triamcinolone (KENALOG) 0.025 % ointment, APPLY TOPICALLY TO LEFT ARM 2 TIMES A DAY UNTIL RASH AND ITCHING RESOLVES, Disp: 30 g, Rfl: 0 .  UNIFINE PENTIPS 31G X 5 MM MISC, USE TO INJECT INSULIN TWO TIMES DAILY, Disp: 100 each, Rfl: 0 .  Vitamin D, Ergocalciferol, (DRISDOL) 50000 units CAPS  capsule, TAKE ONE CAPSULE BY MOUTH EVERY MONDAY, Disp: 4 capsule, Rfl: 0 .  VOLTAREN 1 % GEL, APPLY 2 GM TOPICALLY FOUR TIMES DAILY AS NEEDED FOR PAIN, Disp: 300 g, Rfl: 0 Allergies  Allergen Reactions  . Ace Inhibitors Anaphylaxis    Swelling of the tongue and throat   . Clindamycin Anaphylaxis and Swelling  . Enalapril Maleate Anaphylaxis    Swelling of the tongue and throat   . Lisinopril Anaphylaxis    Swelling of the tongue and throat      Social History   Social History  . Marital status: Single    Spouse name: N/A  . Number of children: 2  . Years of education: 9th grade   Occupational History  . Unemployed     planning on getting disability   Social History Main Topics  . Smoking status: Never Smoker  . Smokeless tobacco: Never Used  . Alcohol use No  . Drug use: No  . Sexual activity: Not  on file   Other Topics Concern  . Not on file   Social History Narrative   Lives in [place] with her son. Is able to read and write fluently in [place].    Physical Exam      SAFE - 01/27/17 1000      Situation   Admitting diagnosis chf   Heart failure history Exisiting   Readmitted within 30 days No   Hospital admission within past 12 months Yes   number of hospital admissions 1   number of ED visits 1   [facility] Weight 200 lbs     Assessment   Lives alone Yes   Primary support person daughter nasiah polinsky   Mode of transportation personal car   Other services involved Other   Home equipement Cane;Scale;Walker     Weight   Weighs self daily Yes   Scale provided Yes   Records on weight chart Yes     Resources   Has "Living better w/heart failure" book Yes   Has HF Zone tool Yes   Able to identify yellow zone signs/when to call MD Yes   Records zone daily Yes     Medications   Uses a pill box Yes   Who stocks the pill box home aide   Pill box checked this visit Yes   Pill box refilled this visit No   Difficulty obtaining medications No   Mail  order medications Yes   New medications at home today No   Which ones n   Next scheduled delivery of medications 02/01/17   Missed one or more doses of medications per week Yes   How many missed doses this week 1     Nutrition   Patient receives meals on wheels No   Patient follows low sodium diet Yes   Has foods at home that meet the current recommended diet Yes   Patient follows low sugar/card diet Yes   Nutritional concerns/issues none     Activity Level   ADL's/Mobility Independent   How many feet can patient ambulate doesn't happen since she got pacemaker   Typical activity level normal   Barriers left side deficits due stroke 2015   Actvity tolerance: NYHA Class 4     Urine   Difficulty urinating No   Changes in urine None     Time spent with patient   Time spent with patient  60 Minutes        Future Appointments Date Time Provider Department Center  02/21/2017 1:15 PM Alm Bustard, MD [facility]  02/21/2017 2:00 PM Cecil Cranker Plyler, RD [facility]  03/09/2017 3:40 PM Ranelle Oyster, MD CPR[facility] CPR  05/02/2017 10:00 AM [facility] CLINIC [facility] None  05/03/2017 11:00 AM Monica Becton, MD PCK-PC[facility] None   ATF pt CAO x4 with no complaints today. Pt stated that taking rx in the morning has been making her feel sick.  Pt was advised to eat breakfast and continue taking her rx until her physician advise otherwise.  Pts new aide had filled pt's pill box which she had several medications placed incorrectly.  CHP re-done the pill box and advised pt that [facility]P will assist her aide in future pill box refills.  Pt denies dizziness, headache and chest pain today. Pt had not picked up her prescriptions from the pharmacy since her last HF visit.  Therefore she hasn't started the furosemide.  pt also stated that she is not taking gabepentine.  Pt's aide went  to the pharmacy and picked up her rx.  CHP found furosemide in pt's room, same mg and those were placed in her pill  box.     *Pt is taking keppra once a day   BP 118/80 (BP Location: Right Arm, Patient Position: Sitting, Cuff Size: Large)   Pulse 92   Resp 16   Wt 240 lb 9.6 oz (109.1 kg)   SpO2 99%   BMI 35.53 kg/m   cbg 146 Weight yesterday-243.9 Last visit weight-    ACTION: Home visit completed

## 2017-02-09 ENCOUNTER — Ambulatory Visit: Payer: Medicaid Other | Admitting: Physical Medicine & Rehabilitation

## 2017-02-11 ENCOUNTER — Telehealth: Payer: Self-pay | Admitting: *Deleted

## 2017-02-11 ENCOUNTER — Other Ambulatory Visit: Payer: Self-pay | Admitting: Internal Medicine

## 2017-02-11 NOTE — Telephone Encounter (Signed)
Prior auth submitted for oxycodone acetaminophen via [employer].

## 2017-02-14 ENCOUNTER — Telehealth (HOSPITAL_COMMUNITY): Payer: Self-pay

## 2017-02-14 ENCOUNTER — Other Ambulatory Visit: Payer: Self-pay | Admitting: Internal Medicine

## 2017-02-14 DIAGNOSIS — E1140 Type 2 diabetes mellitus with diabetic neuropathy, unspecified: Secondary | ICD-10-CM

## 2017-02-14 DIAGNOSIS — IMO0002 Reserved for concepts with insufficient information to code with codable children: Secondary | ICD-10-CM

## 2017-02-14 DIAGNOSIS — E1165 Type 2 diabetes mellitus with hyperglycemia: Principal | ICD-10-CM

## 2017-02-14 NOTE — Telephone Encounter (Signed)
CHP had a schedule appointment with pt today @ 9:15, pt called while I was outside and stated that her aide was not there and requested for me to come later (after she is dressed for the day).  I called pt back this afternoon @1500 , pt stated that she "was across town" and asked me to come on another day.  We rescheduled her appointment for Wednesday.

## 2017-02-16 ENCOUNTER — Other Ambulatory Visit (HOSPITAL_COMMUNITY): Payer: Self-pay | Admitting: Student

## 2017-02-16 ENCOUNTER — Other Ambulatory Visit: Payer: Self-pay | Admitting: Internal Medicine

## 2017-02-16 ENCOUNTER — Other Ambulatory Visit (HOSPITAL_COMMUNITY): Payer: Self-pay | Admitting: Internal Medicine

## 2017-02-16 ENCOUNTER — Other Ambulatory Visit: Payer: Self-pay | Admitting: Physical Medicine & Rehabilitation

## 2017-02-16 DIAGNOSIS — E1165 Type 2 diabetes mellitus with hyperglycemia: Secondary | ICD-10-CM

## 2017-02-16 DIAGNOSIS — E1142 Type 2 diabetes mellitus with diabetic polyneuropathy: Secondary | ICD-10-CM

## 2017-02-16 DIAGNOSIS — I63311 Cerebral infarction due to thrombosis of right middle cerebral artery: Secondary | ICD-10-CM

## 2017-02-16 DIAGNOSIS — G8114 Spastic hemiplegia affecting left nondominant side: Secondary | ICD-10-CM

## 2017-02-16 DIAGNOSIS — IMO0002 Reserved for concepts with insufficient information to code with codable children: Secondary | ICD-10-CM

## 2017-02-16 DIAGNOSIS — Z5181 Encounter for therapeutic drug level monitoring: Secondary | ICD-10-CM

## 2017-02-16 DIAGNOSIS — M1712 Unilateral primary osteoarthritis, left knee: Secondary | ICD-10-CM

## 2017-02-16 DIAGNOSIS — E1140 Type 2 diabetes mellitus with diabetic neuropathy, unspecified: Secondary | ICD-10-CM

## 2017-02-16 DIAGNOSIS — M47896 Other spondylosis, lumbar region: Secondary | ICD-10-CM

## 2017-02-16 DIAGNOSIS — I10 Essential (primary) hypertension: Secondary | ICD-10-CM

## 2017-02-16 NOTE — Telephone Encounter (Signed)
Prior auth for Oxycodone acetaminophen 10/325 #90 approved 02/11/17-08/10/2017   Prior Approval #: [id number]    Confirmation #: [id number]

## 2017-02-17 ENCOUNTER — Other Ambulatory Visit (HOSPITAL_COMMUNITY): Payer: Self-pay

## 2017-02-17 NOTE — Progress Notes (Signed)
Paramedicine Encounter    Patient ID: [name], female    DOB: 1966-07-22, 51 y.o.   MRN: [id number]  ATF pt CAO x  Patient Care Team: Minus Liberty, MD as PCP - General  Patient Active Problem List   Diagnosis Date Noted  . Opioid dependence ([place]) 12/14/2016  . Lumbago 11/22/2016  . Vaginal odor 09/10/2016  . Papular eruption 07/14/2016  . Type 2 diabetes mellitus, uncontrolled, with neuropathy ([place]) 02/04/2016  . Contact dermatitis 02/03/2016  . Left spastic hemiparesis ([place]) 09/23/2015  . Constipation 09/09/2015  . Left shoulder pain 06/30/2015  . Localization-related symptomatic epilepsy and epileptic syndromes with complex partial seizures, not intractable, without status epilepticus ([place]) 06/13/2015  . Hemiparesis affecting left side as late effect of cerebrovascular accident ([place]) 06/13/2015  . Morbid obesity ([place]) 06/13/2015  . Ulcer of left foot ([place]) 06/11/2015  . Candidal intertrigo 11/05/2014  . History of seizure 10/14/2014  . Vitamin D deficiency 10/01/2014  . Tachycardia 07/04/2014  . Generalized weakness 07/04/2014  . Perimenopausal menorrhagia 12/20/2013  . Stroke ([place]) 12/13/2013  . Biventricular implantable cardiac defibrillator -Medtronic 11/23/2013  . Mixed stress and urge urinary incontinence 10/11/2013  . Preventative health care 09/13/2013  . Chronic combined systolic and diastolic heart failure ([place]) 08/01/2013  . Nonischemic cardiomyopathy ([place]) 08/01/2013  . Goiter 08/01/2013  . LBBB (left bundle branch block) 07/19/2013  . Depression 04/09/2013  . Primary osteoarthritis of left knee 04/14/2012  . Lumbar spondylosis 04/14/2012  . Diabetic peripheral neuropathy ([place]) 04/14/2012  . Hyperlipidemia 11/23/2011  . OSA (obstructive sleep apnea) 08/27/2011  . Benign essential HTN 08/27/2010    Current Outpatient Prescriptions:  .  amLODipine (NORVASC) 10 MG tablet, TAKE 1 TABLET BY MOUTH EVERY DAY, Disp: 90 tablet, Rfl: 3 .  atorvastatin  (LIPITOR) 40 MG tablet, TAKE 1 TABLET BY MOUTH EVERY DAY, Disp: 30 tablet, Rfl: 3 .  Blood Glucose Monitoring Suppl (ACCU-CHEK GUIDE) w/Device KIT, 1 each by Does not apply route 3 (three) times daily., Disp: 1 kit, Rfl: 1 .  cyclobenzaprine (FLEXERIL) 10 MG tablet, TAKE ONE TABLET BY MOUTH 3 TIMES A DAY AS NEEDED FOR MUSCLE SPASMS, Disp: 90 tablet, Rfl: 0 .  DIGOX 125 MCG tablet, TAKE ONE TABLET BY MOUTH EVERY DAY, Disp: 30 tablet, Rfl: 11 .  furosemide (LASIX) 40 MG tablet, Take 1 tablet (40 mg total) by mouth 2 (two) times a week. Every Monday and Friday, Disp: 10 tablet, Rfl: 2 .  gabapentin (NEURONTIN) 300 MG capsule, Take 1 capsule (300 mg total) by mouth 2 (two) times daily., Disp: 60 capsule, Rfl: 2 .  hydrALAZINE (APRESOLINE) 50 MG tablet, TAKE 1 & 1/2 TABLETS BY MOUTH 3 TIMES A DAY, Disp: 405 tablet, Rfl: 3 .  isosorbide mononitrate (IMDUR) 60 MG 24 hr tablet, TAKE 1 & 1/2 TABLETS BY MOUTH EVERY DAY, Disp: 45 tablet, Rfl: 2 .  LANTUS SOLOSTAR 100 UNIT/ML Solostar Pen, INJECT 45 UNITS INTO THE SKIN EVERY EVENING AT 10PM, Disp: 15 mL, Rfl: 0 .  levETIRAcetam (KEPPRA) 500 MG tablet, TAKE 1 TABLET BY MOUTH TWO TIMES DAILY **MUST NOT MISS ANY DOSE**, Disp: 180 tablet, Rfl: 1 .  metoprolol succinate (TOPROL-XL) 100 MG 24 hr tablet, TAKE 1 TABLET BY MOUTH TWO TIMES DAILY, Disp: 60 tablet, Rfl: 2 .  pantoprazole (PROTONIX) 40 MG tablet, Take 1 tablet (40 mg total) by mouth daily., Disp: 30 tablet, Rfl: 2 .  rivaroxaban (XARELTO) 20 MG TABS tablet, Take 1 tablet (20 mg total) by  mouth daily with supper., Disp: 30 tablet, Rfl: 3 .  solifenacin (VESICARE) 10 MG tablet, Take 10 mg by mouth daily., Disp: , Rfl:  .  spironolactone (ALDACTONE) 50 MG tablet, TAKE ONE TABLET BY MOUTH EVERY DAY, Disp: 90 tablet, Rfl: 1 .  ACCU-CHEK FASTCLIX LANCETS MISC, Check blood sugar 3 times a day, Disp: 102 each, Rfl: 12 .  ACCU-CHEK SMARTVIEW test strip, USE TO CHECK BLOOD SUGAR THREE TIMES DAILY, Disp: 100 each, Rfl:  12 .  calamine lotion, Apply 1 application topically as needed for itching. (Patient not taking: Reported on 02/17/2017), Disp: 120 mL, Rfl: 0 .  citalopram (CELEXA) 20 MG tablet, TAKE 1 TABLET BY MOUTH EVERY DAY (Patient not taking: Reported on 02/08/2017), Disp: 90 tablet, Rfl: 2 .  docusate sodium (COLACE) 50 MG capsule, Take 1 capsule (50 mg total) by mouth 2 (two) times daily as needed for mild constipation. (Patient not taking: Reported on 02/17/2017), Disp: 60 capsule, Rfl: 6 .  fluticasone (CUTIVATE) 0.05 % cream, Apply topically 2 (two) times daily. Dispense two tubes, Disp: 30 g, Rfl: 6 .  metFORMIN (GLUCOPHAGE XR) 500 MG 24 hr tablet, Take 1 tablet (500 mg total) by mouth 2 (two) times daily. (Patient not taking: Reported on 02/08/2017), Disp: 60 tablet, Rfl: 11 .  mupirocin cream (BACTROBAN) 2 %, Apply 1 application topically 2 (two) times daily. (Patient not taking: Reported on 02/17/2017), Disp: 15 g, Rfl: 2 .  nystatin (NYSTATIN) powder, Apply to affected area twice daily until healed. (Patient not taking: Reported on 02/17/2017), Disp: 30 g, Rfl: 2 .  nystatin cream (MYCOSTATIN), APPLY UNDER ABDOMINAL SKIN AND ON BUTTOCKS 2 TIMES A DAY UNTIL RASH RESOLVES (Patient not taking: Reported on 02/17/2017), Disp: 30 g, Rfl: 0 .  ondansetron (ZOFRAN ODT) 8 MG disintegrating tablet, Take 1 tablet (8 mg total) by mouth every 8 (eight) hours as needed for nausea or vomiting. (Patient not taking: Reported on 02/17/2017), Disp: 10 tablet, Rfl: 0 .  oxyCODONE-acetaminophen (PERCOCET) 10-325 MG tablet, Take 1 tablet by mouth every 8 (eight) hours as needed for pain., Disp: 90 tablet, Rfl: 0 .  polyethylene glycol (MIRALAX / GLYCOLAX) packet, MIX 1 PACKET IN 8OZ OF LIQUID EVERY DAY, Disp: 14 each, Rfl: 0 .  triamcinolone (KENALOG) 0.025 % ointment, APPLY TOPICALLY TO LEFT ARM 2 TIMES A DAY UNTIL RASH AND ITCHING RESOLVES, Disp: 30 g, Rfl: 0 .  UNIFINE PENTIPS 31G X 5 MM MISC, USE TO INJECT INSULIN TWO TIMES DAILY,  Disp: 100 each, Rfl: 0 .  Vitamin D, Ergocalciferol, (DRISDOL) 50000 units CAPS capsule, TAKE ONE CAPSULE BY MOUTH EVERY MONDAY, Disp: 4 capsule, Rfl: 0 .  VOLTAREN 1 % GEL, APPLY 2 GM TOPICALLY FOUR TIMES DAILY AS NEEDED FOR PAIN, Disp: 300 g, Rfl: 0 Allergies  Allergen Reactions  . Ace Inhibitors Anaphylaxis    Swelling of the tongue and throat   . Clindamycin Anaphylaxis and Swelling  . Enalapril Maleate Anaphylaxis    Swelling of the tongue and throat   . Lisinopril Anaphylaxis    Swelling of the tongue and throat      Social History   Social History  . Marital status: Single    Spouse name: N/A  . Number of children: 2  . Years of education: 9th grade   Occupational History  . Unemployed     planning on getting disability   Social History Main Topics  . Smoking status: Never Smoker  . Smokeless tobacco: Never Used  .  Alcohol use No  . Drug use: No  . Sexual activity: Not on file   Other Topics Concern  . Not on file   Social History Narrative   Lives in [place] with her son. Is able to read and write fluently in [place].    Physical Exam  Eyes: Pupils are equal, round, and reactive to light.  Pulmonary/Chest: She has no wheezes. She has no rales. She exhibits no tenderness.  Abdominal: She exhibits no distension. There is no tenderness. There is no guarding.  Musculoskeletal: She exhibits no edema.  Skin: Skin is warm and dry. She is not diaphoretic.        SAFE - 01/27/17 1000      Situation   Admitting diagnosis chf   Heart failure history Exisiting   Readmitted within 30 days No   Hospital admission within past 12 months Yes   number of hospital admissions 1   number of ED visits 1   [facility] Weight 200 lbs     Assessment   Lives alone Yes   Primary support person daughter crystallynn noorani   Mode of transportation personal car   Other services involved Other   Home equipement Cane;Scale;Walker     Weight   Weighs self daily Yes   Scale  provided Yes   Records on weight chart Yes     Resources   Has "Living better w/heart failure" book Yes   Has HF Zone tool Yes   Able to identify yellow zone signs/when to call MD Yes   Records zone daily Yes     Medications   Uses a pill box Yes   Who stocks the pill box home aide   Pill box checked this visit Yes   Pill box refilled this visit No   Difficulty obtaining medications No   Mail order medications Yes   New medications at home today No   Which ones n   Next scheduled delivery of medications 02/01/17   Missed one or more doses of medications per week Yes   How many missed doses this week 1     Nutrition   Patient receives meals on wheels No   Patient follows low sodium diet Yes   Has foods at home that meet the current recommended diet Yes   Patient follows low sugar/card diet Yes   Nutritional concerns/issues none     Activity Level   ADL's/Mobility Independent   How many feet can patient ambulate doesn't happen since she got pacemaker   Typical activity level normal   Barriers left side deficits due stroke 2015   Actvity tolerance: NYHA Class 4     Urine   Difficulty urinating No   Changes in urine None     Time spent with patient   Time spent with patient  60 Minutes        Future Appointments Date Time Provider [place]  02/21/2017 1:15 PM Minus Liberty, MD [facility]  02/21/2017 2:00 PM Comfort, RD [facility]  03/09/2017 3:40 PM Meredith Staggers, MD CPR-PR[facility] CPR  05/02/2017 10:00 AM [facility] CLINIC [facility] None  05/03/2017 11:00 AM Silverio Decamp, MD PCK-PCK None   ATF pt CAO x4 c/o constipation for about 1 week.  Pt drunk phillips magnesia and was only able to pass a small amount. Due to being constipated and feeling nauseated pt did not take any of her afternoon or evening medications. Pt stated that shes been too nauseated to  take her meds.  Pt is out of colace and zofran, due to her switching pharmacies.  Pt is  still taking levetriacetam due to her having seizures. CHP will verify with dr Conley Canal if pt should still take these meds. Pt insisted that they be placed in her pill box "because she doesn't take them then she will have a seizure".  The colace and zofran was called in to [place] whom delievers. Pt stated that she will resume taking her rx today. rx bottles and pill box verified.    Susie and Jonni Sanger made aware of the same when I visited the clinic.   cbg 160 BP (!) 148/100 (BP Location: Right Arm, Patient Position: Sitting, Cuff Size: Large)   Pulse (!) 112   Wt 246 lb (111.6 kg)   SpO2 99%   BMI 36.33 kg/m   Weight yesterday-247.3 Last visit weight-247    ACTION: Home visit completed

## 2017-02-18 ENCOUNTER — Other Ambulatory Visit: Payer: Self-pay

## 2017-02-18 ENCOUNTER — Telehealth: Payer: Self-pay | Admitting: Internal Medicine

## 2017-02-18 DIAGNOSIS — K5901 Slow transit constipation: Secondary | ICD-10-CM

## 2017-02-18 NOTE — Telephone Encounter (Signed)
APT. REMINDER CALL, LMTCB °

## 2017-02-18 NOTE — Telephone Encounter (Signed)
docusate sodium (COLACE) 50 MG capsule, ondansetron (ZOFRAN ODT) 8 MG disintegrating tablet, refill request @ [facility] [address].

## 2017-02-19 NOTE — Assessment & Plan Note (Deleted)
Colonoscopy  Mammogram

## 2017-02-19 NOTE — Assessment & Plan Note (Deleted)
Lab Results  Component Value Date   HGBA1C 9.1 11/22/2016   HGBA1C 8.5 09/10/2016   HGBA1C 8.1 04/29/2016   No results for input(s): GLUCAP in the last 72 hours.   Current medications: metformin XR  Current insulin: taking Lantus 35U QHS (presciption for 45U in EHR)  In 11/2016, she reported noncompliance with metformin due to GI side effects.  Prescribed metformin XR at that time..  Assessment HgbA1c goal: <7.0% Glycemic control: uncontrolled Complications: peripheral neuropathy  Plan Medications: Metformin XR 500 mg daily, increase to 500 BID if tolerated Insulin: Increase Lantus to 40U daily Other: -encouraged to scheduled eye exam

## 2017-02-19 NOTE — Progress Notes (Deleted)
   CC: ***  HPI:  [name] is a 51 y.o. woman with history of HTN, DM2, nonischemic CHF (EF 25-30%, w/ ICD), and CVA (left hemiparesis) who presents for management of diabetes.  Please see A&P for status of the patient's chronic medical conditions.    Past Medical History:  Diagnosis Date  . Allergic rhinitis   . Arthritis    "hips, back, legs, arms" (07/04/2014)  . Asthma    hx  . Automatic implantable cardioverter-defibrillator in situ   . Calcifying tendinitis of shoulder   . Chronic combined systolic and diastolic CHF (congestive heart failure) (HCC)    EF 40-45% by echo 12/06/2012  . Chronic diastolic heart failure (HCC)     Primarily diastolic CHF: Likely due to uncontrolled HTN. Last echo (8/12) with EF 45-50%, mild to moderate LVH with some asymmetric septal hypertrophy, RV normal size and systolic function. EF 50-55% by LV-gram in 6/12.   . Chronic lower back pain    secondary to DJD, obsetiy, hip problems. Followed by Dr. Ivory Broad (pain management)  . Coronary artery disease    questionable. LHC 05/2011 showing normal coronaries // Followed at [facility], Dr. Shirlee Latch  . Degeneration of lumbar or lumbosacral intervertebral disc   . DJD (degenerative joint disease) of hip    right sided  . Frequent UTI   . GERD (gastroesophageal reflux disease)   . HLD (hyperlipidemia)   . Hypertension    Poorly controlled. Has had HTN since age 22. Angioedema with ACEI.  24 Hr urine and renal arterial dopplers ordered . . . Never done  . LBBB (left bundle branch block)   . Liver disease   . Morbid obesity (HCC)   . NICM (nonischemic cardiomyopathy) (HCC)    EF 45-50% in 8/12, cath 6/12 showed normal coronaries, EF 50-55% by LV gram  . OSA on CPAP    sleep study in 8/12 showed moderate to severe OSA requiring CPAP  . Polyneuropathy in diabetes(357.2)   . Presence of permanent cardiac pacemaker   . Seizures (HCC)    last 3 months  . Shortness of breath    none  now  . Stroke [facility]) 12/2013   "my left side is paralyzed" (07/04/2014)  . Thoracic or lumbosacral neuritis or radiculitis, unspecified   . Type II diabetes mellitus (HCC) DX: 2002   Review of Systems:  ROS Physical Exam:  There were no vitals filed for this visit. Physical Exam  Assessment & Plan:   See Encounters Tab for problem based charting.  Patient *** Dr. [name]

## 2017-02-19 NOTE — Assessment & Plan Note (Deleted)
BP Readings from Last 3 Encounters:  02/17/17 (!) 148/100  02/08/17 118/80  02/01/17 135/87   Lab Results  Component Value Date   CREATININE 0.63 01/31/2017   Lab Results  Component Value Date   K 3.8 01/31/2017    Current medications: amlodipine 10 mg daily, hydralazine 75 mg TID, Imdur 60 mg daily, metoprolol succinate 200 mg daily, spironolactone 50 mg daily  Assessment BP goal: 140/90 BP control: well controlled  Plan  Medications: continue current meds Other:

## 2017-02-21 ENCOUNTER — Encounter: Payer: Medicaid Other | Admitting: Internal Medicine

## 2017-02-21 ENCOUNTER — Ambulatory Visit: Payer: Medicaid Other | Admitting: Dietician

## 2017-02-21 MED ORDER — DOCUSATE SODIUM 50 MG PO CAPS: 50.0000 mg | ORAL_CAPSULE | Freq: Two times a day (BID) | ORAL | 2 refills | Status: DC | PRN

## 2017-02-21 MED ORDER — ONDANSETRON 8 MG PO TBDP: 8.0000 mg | ORAL_TABLET | Freq: Three times a day (TID) | ORAL | 0 refills | Status: DC | PRN

## 2017-02-21 NOTE — Telephone Encounter (Signed)
Informed patient not to come this afternoon to her appointment  Because we are closing early due to the weather. She verbalized understanding and will reschedule MNT visit when she reschedules her doctor appointment. She asks about her refill requests from Friday for Colace and Zofran. I see they are pending. Will request them again.

## 2017-02-22 ENCOUNTER — Other Ambulatory Visit (HOSPITAL_COMMUNITY): Payer: Self-pay
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
## 2017-02-24 ENCOUNTER — Encounter: Payer: Self-pay | Admitting: Cardiology

## 2017-03-01 ENCOUNTER — Telehealth (HOSPITAL_COMMUNITY): Payer: Self-pay

## 2017-03-01 NOTE — Telephone Encounter (Signed)
Pt called and cancelled [facility] appointment and rescheduled for tomorrow.  Pt stated that she needed to cancel because her aide car broke down and she'd rather reschedule instead of allowing me to come over.

## 2017-03-02 ENCOUNTER — Other Ambulatory Visit (HOSPITAL_COMMUNITY): Payer: Self-pay

## 2017-03-02 NOTE — Progress Notes (Signed)
Paramedicine Encounter    Patient ID: [name], female    DOB: 06-25-1966, 51 y.o.   MRN: [id number]   Patient Care Team: Minus Liberty, MD as PCP - General  Patient Active Problem List   Diagnosis Date Noted  . Chronic use of opiate for therapeutic purpose 12/14/2016  . Lumbago 11/22/2016  . Vaginal odor 09/10/2016  . Papular eruption 07/14/2016  . Type 2 diabetes mellitus, uncontrolled, with neuropathy ([place]) 02/04/2016  . Contact dermatitis 02/03/2016  . Left spastic hemiparesis ([place]) 09/23/2015  . Constipation 09/09/2015  . Left shoulder pain 06/30/2015  . Localization-related symptomatic epilepsy and epileptic syndromes with complex partial seizures, not intractable, without status epilepticus ([place]) 06/13/2015  . Hemiparesis affecting left side as late effect of cerebrovascular accident ([place]) 06/13/2015  . Morbid obesity ([place]) 06/13/2015  . Ulcer of left foot ([place]) 06/11/2015  . Candidal intertrigo 11/05/2014  . History of seizure 10/14/2014  . Vitamin D deficiency 10/01/2014  . Tachycardia 07/04/2014  . Generalized weakness 07/04/2014  . Perimenopausal menorrhagia 12/20/2013  . Stroke ([place]) 12/13/2013  . Biventricular implantable cardiac defibrillator -Medtronic 11/23/2013  . Mixed stress and urge urinary incontinence 10/11/2013  . Preventative health care 09/13/2013  . Chronic combined systolic and diastolic heart failure ([place]) 08/01/2013  . Nonischemic cardiomyopathy ([place]) 08/01/2013  . Goiter 08/01/2013  . LBBB (left bundle branch block) 07/19/2013  . Depression 04/09/2013  . Primary osteoarthritis of left knee 04/14/2012  . Lumbar spondylosis 04/14/2012  . Diabetic peripheral neuropathy ([place]) 04/14/2012  . Hyperlipidemia 11/23/2011  . OSA (obstructive sleep apnea) 08/27/2011  . Benign essential HTN 08/27/2010    Current Outpatient Prescriptions:  .  ACCU-CHEK FASTCLIX LANCETS MISC, Check blood sugar 3 times a day, Disp: 102 each, Rfl: 12 .   ACCU-CHEK SMARTVIEW test strip, USE TO CHECK BLOOD SUGAR THREE TIMES DAILY, Disp: 100 each, Rfl: 12 .  amLODipine (NORVASC) 10 MG tablet, TAKE 1 TABLET BY MOUTH EVERY DAY, Disp: 90 tablet, Rfl: 3 .  atorvastatin (LIPITOR) 40 MG tablet, TAKE 1 TABLET BY MOUTH EVERY DAY, Disp: 30 tablet, Rfl: 3 .  Blood Glucose Monitoring Suppl (ACCU-CHEK GUIDE) w/Device KIT, 1 each by Does not apply route 3 (three) times daily., Disp: 1 kit, Rfl: 1 .  calamine lotion, Apply 1 application topically as needed for itching. (Patient not taking: Reported on 02/17/2017), Disp: 120 mL, Rfl: 0 .  citalopram (CELEXA) 20 MG tablet, TAKE 1 TABLET BY MOUTH EVERY DAY (Patient not taking: Reported on 02/08/2017), Disp: 90 tablet, Rfl: 2 .  cyclobenzaprine (FLEXERIL) 10 MG tablet, TAKE ONE TABLET BY MOUTH 3 TIMES A DAY AS NEEDED FOR MUSCLE SPASMS, Disp: 90 tablet, Rfl: 0 .  DIGOX 125 MCG tablet, TAKE ONE TABLET BY MOUTH EVERY DAY, Disp: 30 tablet, Rfl: 11 .  docusate sodium (COLACE) 50 MG capsule, Take 1 capsule (50 mg total) by mouth 2 (two) times daily as needed for mild constipation., Disp: 60 capsule, Rfl: 2 .  fluticasone (CUTIVATE) 0.05 % cream, Apply topically 2 (two) times daily. Dispense two tubes, Disp: 30 g, Rfl: 6 .  furosemide (LASIX) 40 MG tablet, Take 1 tablet (40 mg total) by mouth 2 (two) times a week. Every Monday and Friday, Disp: 10 tablet, Rfl: 2 .  gabapentin (NEURONTIN) 300 MG capsule, Take 1 capsule (300 mg total) by mouth 2 (two) times daily., Disp: 60 capsule, Rfl: 2 .  hydrALAZINE (APRESOLINE) 50 MG tablet, TAKE 1 & 1/2 TABLETS BY MOUTH 3 TIMES A DAY,  Disp: 405 tablet, Rfl: 3 .  isosorbide mononitrate (IMDUR) 60 MG 24 hr tablet, TAKE 1 & 1/2 TABLETS BY MOUTH EVERY DAY, Disp: 45 tablet, Rfl: 2 .  LANTUS SOLOSTAR 100 UNIT/ML Solostar Pen, INJECT 45 UNITS INTO THE SKIN EVERY EVENING AT 10PM, Disp: 15 mL, Rfl: 0 .  levETIRAcetam (KEPPRA) 500 MG tablet, TAKE 1 TABLET BY MOUTH TWO TIMES DAILY **MUST NOT MISS ANY  DOSE**, Disp: 180 tablet, Rfl: 1 .  metFORMIN (GLUCOPHAGE XR) 500 MG 24 hr tablet, Take 1 tablet (500 mg total) by mouth 2 (two) times daily. (Patient not taking: Reported on 02/08/2017), Disp: 60 tablet, Rfl: 11 .  metoprolol succinate (TOPROL-XL) 100 MG 24 hr tablet, TAKE 1 TABLET BY MOUTH TWO TIMES DAILY, Disp: 60 tablet, Rfl: 2 .  mupirocin cream (BACTROBAN) 2 %, Apply 1 application topically 2 (two) times daily. (Patient not taking: Reported on 02/17/2017), Disp: 15 g, Rfl: 2 .  nystatin (NYSTATIN) powder, Apply to affected area twice daily until healed. (Patient not taking: Reported on 02/17/2017), Disp: 30 g, Rfl: 2 .  nystatin cream (MYCOSTATIN), APPLY UNDER ABDOMINAL SKIN AND ON BUTTOCKS 2 TIMES A DAY UNTIL RASH RESOLVES (Patient not taking: Reported on 02/17/2017), Disp: 30 g, Rfl: 0 .  ondansetron (ZOFRAN ODT) 8 MG disintegrating tablet, Take 1 tablet (8 mg total) by mouth every 8 (eight) hours as needed for nausea or vomiting., Disp: 10 tablet, Rfl: 0 .  oxyCODONE-acetaminophen (PERCOCET) 10-325 MG tablet, Take 1 tablet by mouth every 8 (eight) hours as needed for pain., Disp: 90 tablet, Rfl: 0 .  pantoprazole (PROTONIX) 40 MG tablet, Take 1 tablet (40 mg total) by mouth daily., Disp: 30 tablet, Rfl: 2 .  polyethylene glycol (MIRALAX / GLYCOLAX) packet, MIX 1 PACKET IN 8OZ OF LIQUID EVERY DAY, Disp: 14 each, Rfl: 0 .  rivaroxaban (XARELTO) 20 MG TABS tablet, Take 1 tablet (20 mg total) by mouth daily with supper., Disp: 30 tablet, Rfl: 3 .  solifenacin (VESICARE) 10 MG tablet, Take 10 mg by mouth daily., Disp: , Rfl:  .  spironolactone (ALDACTONE) 50 MG tablet, TAKE ONE TABLET BY MOUTH EVERY DAY, Disp: 90 tablet, Rfl: 1 .  triamcinolone (KENALOG) 0.025 % ointment, APPLY TOPICALLY TO LEFT ARM 2 TIMES A DAY UNTIL RASH AND ITCHING RESOLVES, Disp: 30 g, Rfl: 0 .  UNIFINE PENTIPS 31G X 5 MM MISC, USE TO INJECT INSULIN TWO TIMES DAILY, Disp: 100 each, Rfl: 0 .  Vitamin D, Ergocalciferol, (DRISDOL)  50000 units CAPS capsule, TAKE ONE CAPSULE BY MOUTH EVERY MONDAY, Disp: 4 capsule, Rfl: 0 .  VOLTAREN 1 % GEL, APPLY 2 GM TOPICALLY FOUR TIMES DAILY AS NEEDED FOR PAIN, Disp: 300 g, Rfl: 0 Allergies  Allergen Reactions  . Ace Inhibitors Anaphylaxis    Swelling of the tongue and throat   . Clindamycin Anaphylaxis and Swelling  . Enalapril Maleate Anaphylaxis    Swelling of the tongue and throat   . Lisinopril Anaphylaxis    Swelling of the tongue and throat      Social History   Social History  . Marital status: Single    Spouse name: N/A  . Number of children: 2  . Years of education: 9th grade   Occupational History  . Unemployed     planning on getting disability   Social History Main Topics  . Smoking status: Never Smoker  . Smokeless tobacco: Never Used  . Alcohol use No  . Drug use: No  . Sexual  activity: Not on file   Other Topics Concern  . Not on file   Social History Narrative   Lives in [place] with her son. Is able to read and write fluently in [place].    Physical Exam  Eyes: Pupils are equal, round, and reactive to light.  Pulmonary/Chest: No respiratory distress. She has no wheezes. She has no rales.  Abdominal: Soft. She exhibits no distension. There is no tenderness. There is no guarding.  Musculoskeletal: She exhibits no edema.  Skin: Skin is warm and dry. She is not diaphoretic.        SAFE - 01/27/17 1000      Situation   Admitting diagnosis chf   Heart failure history Exisiting   Readmitted within 30 days No   Hospital admission within past 12 months Yes   number of hospital admissions 1   number of ED visits 1   [facility] Weight 200 lbs     Assessment   Lives alone Yes   Primary support person daughter hyacinth marcelli   Mode of transportation personal car   Other services involved Other   Home equipement Cane;Scale;Walker     Weight   Weighs self daily Yes   Scale provided Yes   Records on weight chart Yes     Resources    Has "Living better w/heart failure" book Yes   Has HF Zone tool Yes   Able to identify yellow zone signs/when to call MD Yes   Records zone daily Yes     Medications   Uses a pill box Yes   Who stocks the pill box home aide   Pill box checked this visit Yes   Pill box refilled this visit No   Difficulty obtaining medications No   Mail order medications Yes   New medications at home today No   Which ones n   Next scheduled delivery of medications 02/01/17   Missed one or more doses of medications per week Yes   How many missed doses this week 1     Nutrition   Patient receives meals on wheels No   Patient follows low sodium diet Yes   Has foods at home that meet the current recommended diet Yes   Patient follows low sugar/card diet Yes   Nutritional concerns/issues none     Activity Level   ADL's/Mobility Independent   How many feet can patient ambulate doesn't happen since she got pacemaker   Typical activity level normal   Barriers left side deficits due stroke 2015   Actvity tolerance: NYHA Class 4     Urine   Difficulty urinating No   Changes in urine None     Time spent with patient   Time spent with patient  60 Minutes        Future Appointments Date Time Provider [place]  03/09/2017 3:40 PM Meredith Staggers, MD CPR-PRMA CPR  03/28/2017 1:00 PM Chauncey Reading Plyler, RD [facility]  03/28/2017 2:15 PM Minus Liberty, MD [facility]  05/02/2017 10:00 AM MC-[facility] CLINIC MC-[facility] None  05/03/2017 11:00 AM Silverio Decamp, MD PCK-PCK None   ATF pt CAO x4 sitting on the edge of the bed c/o "not feeling well". Pt stated that she had been constipated for about 1.5, and was just able to pass a bowel movement yesterday.  Pt had been out of her GI medications due to finiancial difficulties, therefore she just picked up colace, zofran last week.  Pt took ex-lax  and doculax to have a bowel movement.  I advised pt not to take both medications together and  continue to take colace as prescribed; use either or ex-lax, doculax.  Pt stated that she will resume taking her medications regulary now that her "stomach feels better".  Pt stated that she doesn't feel like eating much and her energy level is low.  Pt was eating saltine crackers and a large bowl of soup upon my arrival.  I advised pt to not eat either food and I showed her the sodium content in both.  I also explained to her family pt low sodium food diet and fluid restrictions.     Pt's aide did not come today due to the weather. Pt denies  dizziness, lightheadiness, sob and chest pain.  rx bottles and pill box verified.    BP (!) 144/98 (BP Location: Left Arm, Patient Position: Sitting, Cuff Size: Large)   Pulse 98   Resp 16   Wt 245 lb 3.2 oz (111.2 kg)   SpO2 100%   BMI 36.21 kg/m   Weight yesterday-she didn't weigh Last visit weight-243    ACTION: Home visit completed

## 2017-03-08 ENCOUNTER — Other Ambulatory Visit (HOSPITAL_COMMUNITY): Payer: Self-pay

## 2017-03-08 NOTE — Progress Notes (Signed)
Paramedicine Encounter    Patient ID: [name], female    DOB: 12/31/1965, 51 y.o.   MRN: [id number]   Patient Care Team: Minus Liberty, MD as PCP - General  Patient Active Problem List   Diagnosis Date Noted  . Chronic use of opiate for therapeutic purpose 12/14/2016  . Lumbago 11/22/2016  . Vaginal odor 09/10/2016  . Papular eruption 07/14/2016  . Type 2 diabetes mellitus, uncontrolled, with neuropathy ([place]) 02/04/2016  . Contact dermatitis 02/03/2016  . Left spastic hemiparesis ([place]) 09/23/2015  . Constipation 09/09/2015  . Left shoulder pain 06/30/2015  . Localization-related symptomatic epilepsy and epileptic syndromes with complex partial seizures, not intractable, without status epilepticus ([place]) 06/13/2015  . Hemiparesis affecting left side as late effect of cerebrovascular accident ([place]) 06/13/2015  . Morbid obesity ([place]) 06/13/2015  . Ulcer of left foot ([place]) 06/11/2015  . Candidal intertrigo 11/05/2014  . History of seizure 10/14/2014  . Vitamin D deficiency 10/01/2014  . Tachycardia 07/04/2014  . Generalized weakness 07/04/2014  . Perimenopausal menorrhagia 12/20/2013  . Stroke ([place]) 12/13/2013  . Biventricular implantable cardiac defibrillator -Medtronic 11/23/2013  . Mixed stress and urge urinary incontinence 10/11/2013  . Preventative health care 09/13/2013  . Chronic combined systolic and diastolic heart failure ([place]) 08/01/2013  . Nonischemic cardiomyopathy ([place]) 08/01/2013  . Goiter 08/01/2013  . LBBB (left bundle branch block) 07/19/2013  . Depression 04/09/2013  . Primary osteoarthritis of left knee 04/14/2012  . Lumbar spondylosis 04/14/2012  . Diabetic peripheral neuropathy ([place]) 04/14/2012  . Hyperlipidemia 11/23/2011  . OSA (obstructive sleep apnea) 08/27/2011  . Benign essential HTN 08/27/2010    Current Outpatient Prescriptions:  .  ACCU-CHEK FASTCLIX LANCETS MISC, Check blood sugar 3 times a day, Disp: 102 each, Rfl: 12 .   ACCU-CHEK SMARTVIEW test strip, USE TO CHECK BLOOD SUGAR THREE TIMES DAILY, Disp: 100 each, Rfl: 12 .  amLODipine (NORVASC) 10 MG tablet, TAKE 1 TABLET BY MOUTH EVERY DAY, Disp: 90 tablet, Rfl: 3 .  atorvastatin (LIPITOR) 40 MG tablet, TAKE 1 TABLET BY MOUTH EVERY DAY, Disp: 30 tablet, Rfl: 3 .  Blood Glucose Monitoring Suppl (ACCU-CHEK GUIDE) w/Device KIT, 1 each by Does not apply route 3 (three) times daily., Disp: 1 kit, Rfl: 1 .  calamine lotion, Apply 1 application topically as needed for itching. (Patient not taking: Reported on 02/17/2017), Disp: 120 mL, Rfl: 0 .  citalopram (CELEXA) 20 MG tablet, TAKE 1 TABLET BY MOUTH EVERY DAY (Patient not taking: Reported on 02/08/2017), Disp: 90 tablet, Rfl: 2 .  cyclobenzaprine (FLEXERIL) 10 MG tablet, TAKE ONE TABLET BY MOUTH 3 TIMES A DAY AS NEEDED FOR MUSCLE SPASMS, Disp: 90 tablet, Rfl: 0 .  DIGOX 125 MCG tablet, TAKE ONE TABLET BY MOUTH EVERY DAY, Disp: 30 tablet, Rfl: 11 .  docusate sodium (COLACE) 50 MG capsule, Take 1 capsule (50 mg total) by mouth 2 (two) times daily as needed for mild constipation., Disp: 60 capsule, Rfl: 2 .  fluticasone (CUTIVATE) 0.05 % cream, Apply topically 2 (two) times daily. Dispense two tubes, Disp: 30 g, Rfl: 6 .  furosemide (LASIX) 40 MG tablet, Take 1 tablet (40 mg total) by mouth 2 (two) times a week. Every Monday and Friday, Disp: 10 tablet, Rfl: 2 .  gabapentin (NEURONTIN) 300 MG capsule, Take 1 capsule (300 mg total) by mouth 2 (two) times daily., Disp: 60 capsule, Rfl: 2 .  hydrALAZINE (APRESOLINE) 50 MG tablet, TAKE 1 & 1/2 TABLETS BY MOUTH 3 TIMES A DAY,  Disp: 405 tablet, Rfl: 3 .  isosorbide mononitrate (IMDUR) 60 MG 24 hr tablet, TAKE 1 & 1/2 TABLETS BY MOUTH EVERY DAY, Disp: 45 tablet, Rfl: 2 .  LANTUS SOLOSTAR 100 UNIT/ML Solostar Pen, INJECT 45 UNITS INTO THE SKIN EVERY EVENING AT 10PM, Disp: 15 mL, Rfl: 0 .  levETIRAcetam (KEPPRA) 500 MG tablet, TAKE 1 TABLET BY MOUTH TWO TIMES DAILY **MUST NOT MISS ANY  DOSE**, Disp: 180 tablet, Rfl: 1 .  metFORMIN (GLUCOPHAGE XR) 500 MG 24 hr tablet, Take 1 tablet (500 mg total) by mouth 2 (two) times daily. (Patient not taking: Reported on 02/08/2017), Disp: 60 tablet, Rfl: 11 .  metoprolol succinate (TOPROL-XL) 100 MG 24 hr tablet, TAKE 1 TABLET BY MOUTH TWO TIMES DAILY, Disp: 60 tablet, Rfl: 2 .  mupirocin cream (BACTROBAN) 2 %, Apply 1 application topically 2 (two) times daily. (Patient not taking: Reported on 02/17/2017), Disp: 15 g, Rfl: 2 .  nystatin (NYSTATIN) powder, Apply to affected area twice daily until healed. (Patient not taking: Reported on 02/17/2017), Disp: 30 g, Rfl: 2 .  nystatin cream (MYCOSTATIN), APPLY UNDER ABDOMINAL SKIN AND ON BUTTOCKS 2 TIMES A DAY UNTIL RASH RESOLVES (Patient not taking: Reported on 02/17/2017), Disp: 30 g, Rfl: 0 .  ondansetron (ZOFRAN ODT) 8 MG disintegrating tablet, Take 1 tablet (8 mg total) by mouth every 8 (eight) hours as needed for nausea or vomiting., Disp: 10 tablet, Rfl: 0 .  oxyCODONE-acetaminophen (PERCOCET) 10-325 MG tablet, Take 1 tablet by mouth every 8 (eight) hours as needed for pain., Disp: 90 tablet, Rfl: 0 .  pantoprazole (PROTONIX) 40 MG tablet, Take 1 tablet (40 mg total) by mouth daily., Disp: 30 tablet, Rfl: 2 .  polyethylene glycol (MIRALAX / GLYCOLAX) packet, MIX 1 PACKET IN 8OZ OF LIQUID EVERY DAY, Disp: 14 each, Rfl: 0 .  rivaroxaban (XARELTO) 20 MG TABS tablet, Take 1 tablet (20 mg total) by mouth daily with supper., Disp: 30 tablet, Rfl: 3 .  solifenacin (VESICARE) 10 MG tablet, Take 10 mg by mouth daily., Disp: , Rfl:  .  spironolactone (ALDACTONE) 50 MG tablet, TAKE ONE TABLET BY MOUTH EVERY DAY, Disp: 90 tablet, Rfl: 1 .  triamcinolone (KENALOG) 0.025 % ointment, APPLY TOPICALLY TO LEFT ARM 2 TIMES A DAY UNTIL RASH AND ITCHING RESOLVES, Disp: 30 g, Rfl: 0 .  UNIFINE PENTIPS 31G X 5 MM MISC, USE TO INJECT INSULIN TWO TIMES DAILY, Disp: 100 each, Rfl: 0 .  Vitamin D, Ergocalciferol, (DRISDOL)  50000 units CAPS capsule, TAKE ONE CAPSULE BY MOUTH EVERY MONDAY, Disp: 4 capsule, Rfl: 0 .  VOLTAREN 1 % GEL, APPLY 2 GM TOPICALLY FOUR TIMES DAILY AS NEEDED FOR PAIN, Disp: 300 g, Rfl: 0 Allergies  Allergen Reactions  . Ace Inhibitors Anaphylaxis    Swelling of the tongue and throat   . Clindamycin Anaphylaxis and Swelling  . Enalapril Maleate Anaphylaxis    Swelling of the tongue and throat   . Lisinopril Anaphylaxis    Swelling of the tongue and throat      Social History   Social History  . Marital status: Single    Spouse name: N/A  . Number of children: 2  . Years of education: 9th grade   Occupational History  . Unemployed     planning on getting disability   Social History Main Topics  . Smoking status: Never Smoker  . Smokeless tobacco: Never Used  . Alcohol use No  . Drug use: No  . Sexual  activity: Not on file   Other Topics Concern  . Not on file   Social History Narrative   Lives in [place] with her son. Is able to read and write fluently in [place].    Physical Exam  Eyes: Pupils are equal, round, and reactive to light.  Pulmonary/Chest: No respiratory distress. She has no wheezes. She has no rales.  Abdominal: She exhibits no distension. There is no tenderness. There is no guarding.  Musculoskeletal: She exhibits no edema.  Skin: Skin is warm and dry. She is not diaphoretic.        SAFE - 01/27/17 1000      Situation   Admitting diagnosis chf   Heart failure history Exisiting   Readmitted within 30 days No   Hospital admission within past 12 months Yes   number of hospital admissions 1   number of ED visits 1   [facility] Weight 200 lbs     Assessment   Lives alone Yes   Primary support person daughter aanyah loa   Mode of transportation personal car   Other services involved Other   Home equipement Cane;Scale;Walker     Weight   Weighs self daily Yes   Scale provided Yes   Records on weight chart Yes     Resources   Has  "Living better w/heart failure" book Yes   Has HF Zone tool Yes   Able to identify yellow zone signs/when to call MD Yes   Records zone daily Yes     Medications   Uses a pill box Yes   Who stocks the pill box home aide   Pill box checked this visit Yes   Pill box refilled this visit No   Difficulty obtaining medications No   Mail order medications Yes   New medications at home today No   Which ones n   Next scheduled delivery of medications 02/01/17   Missed one or more doses of medications per week Yes   How many missed doses this week 1     Nutrition   Patient receives meals on wheels No   Patient follows low sodium diet Yes   Has foods at home that meet the current recommended diet Yes   Patient follows low sugar/card diet Yes   Nutritional concerns/issues none     Activity Level   ADL's/Mobility Independent   How many feet can patient ambulate doesn't happen since she got pacemaker   Typical activity level normal   Barriers left side deficits due stroke 2015   Actvity tolerance: NYHA Class 4     Urine   Difficulty urinating No   Changes in urine None     Time spent with patient   Time spent with patient  60 Minutes        Future Appointments Date Time Provider [place]  03/09/2017 3:40 PM Meredith Staggers, MD CPR-PRMA CPR  03/28/2017 1:00 PM Chauncey Reading Plyler, RD IMP[facility]  03/28/2017 2:15 PM Minus Liberty, MD IMP[facility]  05/02/2017 10:00 AM [place] MC-HVSC None  05/03/2017 11:00 AM Silverio Decamp, MD PCK-PCK None  06/10/2017 11:30 AM Deboraha Sprang, MD [facility]   ATF pt CAO x4 complaining of constipation since last week.  Pt had reliaef last week after taking multiple doses of anti-constipation medications (over the counter).  Pt is taking her medications as prescribed. Pt has an dentist appointment today.  I gave pt a list of foods that she should stay  away from last Friday along with "low sodium" recipes.  Pt  stated that she has been trying to eat better and to change her eating habits.  rx bottles and pill box verified.  Pt is switching from [place] to [place].    BP 122/90 (BP Location: Right Arm, Patient Position: Sitting, Cuff Size: Large)   Pulse 97   Resp 20   Wt 249 lb (112.9 kg)   SpO2 99%   BMI 36.77 kg/m   cbg 199  **[facility]  foursemide, isosorbide, pantoprazole   Weight yesterday-247.9 Last visit weight-246   ACTION: Home visit completed

## 2017-03-09 ENCOUNTER — Encounter: Payer: Medicaid Other | Admitting: Physical Medicine & Rehabilitation

## 2017-03-10 ENCOUNTER — Encounter: Payer: Self-pay | Admitting: Registered Nurse

## 2017-03-10 ENCOUNTER — Encounter: Payer: Medicaid Other | Attending: Physical Medicine & Rehabilitation | Admitting: Registered Nurse

## 2017-03-10 ENCOUNTER — Other Ambulatory Visit: Payer: Self-pay | Admitting: Internal Medicine

## 2017-03-10 VITALS — BP 124/81 | HR 99 | Resp 14

## 2017-03-10 DIAGNOSIS — M47817 Spondylosis without myelopathy or radiculopathy, lumbosacral region: Secondary | ICD-10-CM | POA: Diagnosis present

## 2017-03-10 DIAGNOSIS — G8114 Spastic hemiplegia affecting left nondominant side: Secondary | ICD-10-CM

## 2017-03-10 DIAGNOSIS — I63311 Cerebral infarction due to thrombosis of right middle cerebral artery: Secondary | ICD-10-CM

## 2017-03-10 DIAGNOSIS — Z5181 Encounter for therapeutic drug level monitoring: Secondary | ICD-10-CM

## 2017-03-10 DIAGNOSIS — E1342 Other specified diabetes mellitus with diabetic polyneuropathy: Secondary | ICD-10-CM | POA: Diagnosis present

## 2017-03-10 DIAGNOSIS — I63319 Cerebral infarction due to thrombosis of unspecified middle cerebral artery: Secondary | ICD-10-CM | POA: Diagnosis present

## 2017-03-10 DIAGNOSIS — E1165 Type 2 diabetes mellitus with hyperglycemia: Principal | ICD-10-CM

## 2017-03-10 DIAGNOSIS — G629 Polyneuropathy, unspecified: Secondary | ICD-10-CM | POA: Diagnosis present

## 2017-03-10 DIAGNOSIS — G894 Chronic pain syndrome: Secondary | ICD-10-CM | POA: Diagnosis not present

## 2017-03-10 DIAGNOSIS — M25511 Pain in right shoulder: Secondary | ICD-10-CM | POA: Diagnosis not present

## 2017-03-10 DIAGNOSIS — G8110 Spastic hemiplegia affecting unspecified side: Secondary | ICD-10-CM | POA: Insufficient documentation

## 2017-03-10 DIAGNOSIS — E1142 Type 2 diabetes mellitus with diabetic polyneuropathy: Secondary | ICD-10-CM

## 2017-03-10 DIAGNOSIS — M25512 Pain in left shoulder: Secondary | ICD-10-CM | POA: Diagnosis not present

## 2017-03-10 DIAGNOSIS — IMO0002 Reserved for concepts with insufficient information to code with codable children: Secondary | ICD-10-CM

## 2017-03-10 DIAGNOSIS — I5042 Chronic combined systolic (congestive) and diastolic (congestive) heart failure: Secondary | ICD-10-CM | POA: Diagnosis present

## 2017-03-10 DIAGNOSIS — E1140 Type 2 diabetes mellitus with diabetic neuropathy, unspecified: Secondary | ICD-10-CM

## 2017-03-10 DIAGNOSIS — Z79899 Other long term (current) drug therapy: Secondary | ICD-10-CM

## 2017-03-10 MED ORDER — OXYCODONE-ACETAMINOPHEN 10-325 MG PO TABS: 1.0000 | ORAL_TABLET | Freq: Three times a day (TID) | ORAL | 0 refills | Status: DC | PRN

## 2017-03-10 NOTE — Progress Notes (Signed)
Subjective:    Patient ID: [name], female    DOB: 09-19-1966, 51 y.o.   MRN: [id number]  HPI: [name] is a 51 year old female who returns for follow up appointment for chronic pain and medication refill. She states her pain is located in her bilateral shoulder's, lower back and left knee. She rates her pain 5. Her current exercise regime is performing stretching exercises daily and walking with her 4 prong cane in her home. She arrived in her wheelchair today.  Pain Inventory Average Pain 6 Pain Right Now 5 My pain is constant  In the last 24 hours, has pain interfered with the following? General activity 7 Relation with others 7 Enjoyment of life 8 What TIME of day is your pain at its worst? night Sleep (in general) Fair  Pain is worse with: walking, bending, standing and some activites Pain improves with: rest, heat/ice, therapy/exercise, medication and injections Relief from Meds: 6  Mobility walk with assistance use a cane ability to climb steps?  yes do you drive?  yes Do you have any goals in this area?  yes  Function disabled: date disabled . I need assistance with the following:  dressing, bathing, toileting, meal prep, household duties and shopping Do you have any goals in this area?  no  Neuro/Psych bladder control problems bowel control problems weakness numbness tingling  Prior Studies Any changes since last visit?  no  Physicians involved in your care Any changes since last visit?  no   Family History  Problem Relation Age of Onset  . Heart disease Mother 1    Died of MI at age 57 yo  . Kidney disease Mother     requiring dialysis  . Congestive Heart Failure Mother   . Heart disease Father 21    MI age 71yo requiring stenting  . Diabetes Father   . Glaucoma Father   . Heart disease Paternal Grandmother     requiring pacemaker.  [name] Heart disease Paternal Grandfather 51    Died of MI at possibly age 85-53yo  . Stroke  Paternal Grandfather   . Diabetes Brother   . Heart disease Brother 67    MI at age 39 years old  . Breast cancer Paternal Aunt   . Breast cancer Maternal Grandmother    Social History   Social History  . Marital status: Single    Spouse name: N/A  . Number of children: 2  . Years of education: 9th grade   Occupational History  . Unemployed     planning on getting disability   Social History Main Topics  . Smoking status: Never Smoker  . Smokeless tobacco: Never Used  . Alcohol use No  . Drug use: No  . Sexual activity: Not Asked   Other Topics Concern  . None   Social History Narrative   Lives in [place] with her son. Is able to read and write fluently in [place].   Past Surgical History:  Procedure Laterality Date  . BI-VENTRICULAR IMPLANTABLE CARDIOVERTER DEFIBRILLATOR N/A 08/22/2013   Procedure: BI-VENTRICULAR IMPLANTABLE CARDIOVERTER DEFIBRILLATOR  (CRT-D);  Surgeon: [name], MD;  Location: [facility] CATH LAB;  Service: Cardiovascular;  Laterality: N/A;  . BI-VENTRICULAR IMPLANTABLE CARDIOVERTER DEFIBRILLATOR  (CRT-D)  08/2013   [name] 08/23/2013  . BREAST SURGERY Bilateral 2011   patient reports benign results  . CARDIAC CATHETERIZATION  05/2011  . CARPAL TUNNEL RELEASE Left    denies  . HEMIARTHROPLASTY SHOULDER  FRACTURE Right 1980's   denies  . MULTIPLE TOOTH EXTRACTIONS  ~ 2011   tumors removed ; "my whole top"  . SHOULDER ARTHROSCOPY Right 12/26/2015   Procedure: Right Shoulder Arthroscopy, Debridement, and Decompression;  Surgeon: [name], MD;  Location: [facility] OR;  Service: Orthopedics;  Laterality: Right;  . TEE WITHOUT CARDIOVERSION N/A 01/14/2014   Procedure: TRANSESOPHAGEAL ECHOCARDIOGRAM (TEE);  Surgeon: Thurmon Fair, MD;  Location: [facility] ENDOSCOPY;  Service: Cardiovascular;  Laterality: N/A;  . TUBAL LIGATION  05/31/1985   Past Medical History:  Diagnosis Date  . Allergic rhinitis   . Arthritis    "hips, back, legs, arms" (07/04/2014)  . Asthma     hx  . Automatic implantable cardioverter-defibrillator in situ   . Calcifying tendinitis of shoulder   . Chronic combined systolic and diastolic CHF (congestive heart failure) (HCC)    EF 40-45% by echo 12/06/2012  . Chronic diastolic heart failure (HCC)     Primarily diastolic CHF: Likely due to uncontrolled HTN. Last echo (8/12) with EF 45-50%, mild to moderate LVH with some asymmetric septal hypertrophy, RV normal size and systolic function. EF 50-55% by LV-gram in 6/12.   . Chronic lower back pain    secondary to DJD, obsetiy, hip problems. Followed by Dr. Ivory Broad (pain management)  . Coronary artery disease    questionable. LHC 05/2011 showing normal coronaries // Followed at [facility], Dr. Shirlee Latch  . Degeneration of lumbar or lumbosacral intervertebral disc   . DJD (degenerative joint disease) of hip    right sided  . Frequent UTI   . GERD (gastroesophageal reflux disease)   . HLD (hyperlipidemia)   . Hypertension    Poorly controlled. Has had HTN since age 30. Angioedema with ACEI.  24 Hr urine and renal arterial dopplers ordered . . . Never done  . LBBB (left bundle branch block)   . Liver disease   . Morbid obesity (HCC)   . NICM (nonischemic cardiomyopathy) (HCC)    EF 45-50% in 8/12, cath 6/12 showed normal coronaries, EF 50-55% by LV gram  . OSA on CPAP    sleep study in 8/12 showed moderate to severe OSA requiring CPAP  . Polyneuropathy in diabetes(357.2)   . Presence of permanent cardiac pacemaker   . Seizures (HCC)    last 3 months  . Shortness of breath    none now  . Stroke [facility]) 12/2013   "my left side is paralyzed" (07/04/2014)  . Thoracic or lumbosacral neuritis or radiculitis, unspecified   . Type II diabetes mellitus (HCC) DX: 2002   BP 124/81   Pulse 99   Resp 14   SpO2 96%   Opioid Risk Score:   Fall Risk Score:  `1  Depression screen PHQ 2/9  Depression screen [facility] 2/9 02/01/2017 11/22/2016 09/10/2016 07/28/2016 07/14/2016 04/29/2016  03/05/2016  Decreased Interest 0 0 0 0 0 0 0  Down, Depressed, Hopeless 0 0 0 0 - 0 0  PHQ - 2 Score 0 0 0 0 0 0 0  Altered sleeping - - - - - - -  Tired, decreased energy - - - - - - -  Change in appetite - - - - - - -  Feeling bad or failure about yourself  - - - - - - -  Trouble concentrating - - - - - - -  Moving slowly or fidgety/restless - - - - - - -  Suicidal thoughts - - - - - - -  PHQ-9 Score - - - - - - -  Some recent data might be hidden    Review of Systems  HENT: Negative.   Eyes: Negative.   Respiratory: Negative.   Gastrointestinal: Positive for constipation.  Endocrine: Negative.        High blood sugar   Genitourinary: Positive for difficulty urinating.  Musculoskeletal: Positive for arthralgias, back pain, gait problem, myalgias and neck pain.  Skin: Negative.   Allergic/Immunologic: Negative.   Neurological: Positive for weakness and numbness.  Hematological: Negative.   Psychiatric/Behavioral: Negative.   All other systems reviewed and are negative.      Objective:   Physical Exam  Constitutional: She is oriented to person, place, and time. She appears well-developed and well-nourished.  HENT:  Head: Normocephalic and atraumatic.  Neck: Normal range of motion. Neck supple.  Cardiovascular: Normal rate and regular rhythm.   Pulmonary/Chest: Effort normal and breath sounds normal.  Musculoskeletal:  Normal Muscle Bulk and Muscle Testing Reveals: Upper Extremities: Right: Full ROM and Muscle Strength 5/5 Left: Hemiparesis Bilateral AC Joint Tenderness Thoracic Paraspinal Tenderness: T-7-T-9 Lumbar Paraspinal Tenderness: L-4-L-5 Lower Extremities: Right: Full ROM and Muscle Strength 5/5 Left: Hemiparesis Arrived in wheelchair  Neurological: She is alert and oriented to person, place, and time.  Skin: Skin is warm and dry.  Psychiatric: She has a normal mood and affect.  Nursing note and vitals reviewed.         Assessment & Plan:  1.  Chronic low back pain: Continue Current Medication Regime. 03/10/17  Refilled: oxyCODONE 10/325mg  one tablet every 8 hours as needed.#90. We will continue the opioid monitoring program. This consists of regular clinic visits, examinations, urine drug screen, pill counts as well as use of [place] Controlled Substance Reporting System. 2. Diabetic peripheral neuropathy: Continue GabapentinContinue to monitor. 03/10/2017 3. Recent Right MCA infarct: Continue To Monitor.S/P Botox injection 12/15/16, Scheduled for Botox in 5/18.  4. Bilateral Shoulder Pain /OA: Continue Voltaren gel   20 minutes of face to face patient care time was spent during this visit. All questions were encouraged and answered.    F/U in 1 month

## 2017-03-14 ENCOUNTER — Telehealth: Payer: Self-pay | Admitting: Registered Nurse

## 2017-03-14 NOTE — Telephone Encounter (Signed)
On 03/14/2017 the NCCSR was reviewed no conflict was seen on the [facility] Controlled Substance Reporting System with multiple prescribers. Ms. Kreamer a signed narcotic contract with our office. If there were any discrepancies this would have been reported to her physician.

## 2017-03-17 ENCOUNTER — Telehealth: Payer: Self-pay

## 2017-03-17 DIAGNOSIS — M1712 Unilateral primary osteoarthritis, left knee: Secondary | ICD-10-CM

## 2017-03-17 DIAGNOSIS — M47896 Other spondylosis, lumbar region: Secondary | ICD-10-CM

## 2017-03-17 MED ORDER — DICLOFENAC SODIUM 1 % TD GEL: TRANSDERMAL | 0 refills | Status: DC

## 2017-03-17 NOTE — Telephone Encounter (Signed)
Patient would like a rx refill on Voltaren gel. Patient has been notified of rx refill.

## 2017-03-18 ENCOUNTER — Other Ambulatory Visit: Payer: Self-pay | Admitting: Internal Medicine

## 2017-03-18 ENCOUNTER — Other Ambulatory Visit (HOSPITAL_COMMUNITY): Payer: Self-pay

## 2017-03-18 MED ORDER — PANTOPRAZOLE SODIUM 40 MG PO TBEC: 40.0000 mg | DELAYED_RELEASE_TABLET | Freq: Every day | ORAL | 1 refills | Status: DC

## 2017-03-18 NOTE — Progress Notes (Signed)
Paramedicine Encounter    Patient ID: [name], female    DOB: [birth year], 51 y.o.   MRN: [id number]  Patient Care Team: Minus Liberty, MD as PCP - General  Patient Active Problem List   Diagnosis Date Noted  . Chronic use of opiate for therapeutic purpose 12/14/2016  . Lumbago 11/22/2016  . Vaginal odor 09/10/2016  . Papular eruption 07/14/2016  . Type 2 diabetes mellitus, uncontrolled, with neuropathy ([place]) 02/04/2016  . Contact dermatitis 02/03/2016  . Left spastic hemiparesis ([place]) 09/23/2015  . Constipation 09/09/2015  . Left shoulder pain 06/30/2015  . Localization-related symptomatic epilepsy and epileptic syndromes with complex partial seizures, not intractable, without status epilepticus ([place]) 06/13/2015  . Hemiparesis affecting left side as late effect of cerebrovascular accident ([place]) 06/13/2015  . Morbid obesity ([place]) 06/13/2015  . Ulcer of left foot ([place]) 06/11/2015  . [place] intertrigo 11/05/2014  . History of seizure 10/14/2014  . Vitamin D deficiency 10/01/2014  . Tachycardia 07/04/2014  . Generalized weakness 07/04/2014  . Perimenopausal menorrhagia 12/20/2013  . Stroke ([place]) 12/13/2013  . Biventricular implantable cardiac defibrillator -Medtronic 11/23/2013  . Mixed stress and urge urinary incontinence 10/11/2013  . Preventative health care 09/13/2013  . Chronic combined systolic and diastolic heart failure ([place]) 08/01/2013  . Nonischemic cardiomyopathy ([place]) 08/01/2013  . Goiter 08/01/2013  . LBBB (left bundle branch block) 07/19/2013  . Depression 04/09/2013  . Primary osteoarthritis of left knee 04/14/2012  . Lumbar spondylosis 04/14/2012  . Diabetic peripheral neuropathy ([place]) 04/14/2012  . Hyperlipidemia 11/23/2011  . OSA (obstructive sleep apnea) 08/27/2011  . Benign essential HTN 08/27/2010    Current Outpatient Prescriptions:  .  ACCU-CHEK FASTCLIX LANCETS MISC, Check blood sugar 3 times a day, Disp: 102 each, Rfl: 12 .  ACCU-CHEK  SMARTVIEW test strip, USE TO CHECK BLOOD SUGAR THREE TIMES DAILY, Disp: 100 each, Rfl: 12 .  amLODipine (NORVASC) 10 MG tablet, TAKE 1 TABLET BY MOUTH EVERY DAY, Disp: 90 tablet, Rfl: 3 .  atorvastatin (LIPITOR) 40 MG tablet, TAKE 1 TABLET BY MOUTH EVERY DAY, Disp: 30 tablet, Rfl: 3 .  Blood Glucose Monitoring Suppl (ACCU-CHEK GUIDE) w/Device KIT, 1 each by Does not apply route 3 (three) times daily., Disp: 1 kit, Rfl: 1 .  cyclobenzaprine (FLEXERIL) 10 MG tablet, TAKE ONE TABLET BY MOUTH 3 TIMES A DAY AS NEEDED FOR MUSCLE SPASMS, Disp: 90 tablet, Rfl: 0 .  DIGOX 125 MCG tablet, TAKE ONE TABLET BY MOUTH EVERY DAY, Disp: 30 tablet, Rfl: 11 .  docusate sodium (COLACE) 50 MG capsule, Take 1 capsule (50 mg total) by mouth 2 (two) times daily as needed for mild constipation., Disp: 60 capsule, Rfl: 2 .  furosemide (LASIX) 40 MG tablet, Take 1 tablet (40 mg total) by mouth 2 (two) times a week. Every Monday and Friday, Disp: 10 tablet, Rfl: 2 .  gabapentin (NEURONTIN) 300 MG capsule, Take 1 capsule (300 mg total) by mouth 2 (two) times daily., Disp: 60 capsule, Rfl: 2 .  hydrALAZINE (APRESOLINE) 50 MG tablet, TAKE 1 & 1/2 TABLETS BY MOUTH 3 TIMES A DAY, Disp: 405 tablet, Rfl: 3 .  isosorbide mononitrate (IMDUR) 60 MG 24 hr tablet, TAKE 1 & 1/2 TABLETS BY MOUTH EVERY DAY, Disp: 45 tablet, Rfl: 2 .  LANTUS SOLOSTAR 100 UNIT/ML Solostar Pen, INJECT 45 UNITS INTO THE SKIN EVERY EVENING AT 10PM, Disp: 15 mL, Rfl: 6 .  metoprolol succinate (TOPROL-XL) 100 MG 24 hr tablet, TAKE 1 TABLET BY MOUTH TWO TIMES DAILY,  Disp: 60 tablet, Rfl: 2 .  rivaroxaban (XARELTO) 20 MG TABS tablet, Take 1 tablet (20 mg total) by mouth daily with supper., Disp: 30 tablet, Rfl: 3 .  solifenacin (VESICARE) 10 MG tablet, Take 10 mg by mouth daily., Disp: , Rfl:  .  spironolactone (ALDACTONE) 50 MG tablet, TAKE ONE TABLET BY MOUTH EVERY DAY, Disp: 90 tablet, Rfl: 1 .  Vitamin D, Ergocalciferol, (DRISDOL) 50000 units CAPS capsule, TAKE  ONE CAPSULE BY MOUTH EVERY MONDAY, Disp: 4 capsule, Rfl: 0 .  calamine lotion, Apply 1 application topically as needed for itching., Disp: 120 mL, Rfl: 0 .  citalopram (CELEXA) 20 MG tablet, TAKE 1 TABLET BY MOUTH EVERY DAY, Disp: 90 tablet, Rfl: 2 .  diclofenac sodium (VOLTAREN) 1 % GEL, APPLY 2 GM TOPICALLY FOUR TIMES DAILY AS NEEDED FOR PAIN, Disp: 300 g, Rfl: 0 .  fluticasone (CUTIVATE) 0.05 % cream, Apply topically 2 (two) times daily. Dispense two tubes, Disp: 30 g, Rfl: 6 .  levETIRAcetam (KEPPRA) 500 MG tablet, TAKE 1 TABLET BY MOUTH TWO TIMES DAILY **MUST NOT MISS ANY DOSE**, Disp: 180 tablet, Rfl: 1 .  metFORMIN (GLUCOPHAGE XR) 500 MG 24 hr tablet, Take 1 tablet (500 mg total) by mouth 2 (two) times daily., Disp: 60 tablet, Rfl: 11 .  mupirocin cream (BACTROBAN) 2 %, Apply 1 application topically 2 (two) times daily., Disp: 15 g, Rfl: 2 .  nystatin (NYSTATIN) powder, Apply to affected area twice daily until healed., Disp: 30 g, Rfl: 2 .  nystatin cream (MYCOSTATIN), APPLY UNDER ABDOMINAL SKIN AND ON BUTTOCKS 2 TIMES A DAY UNTIL RASH RESOLVES, Disp: 30 g, Rfl: 0 .  ondansetron (ZOFRAN ODT) 8 MG disintegrating tablet, Take 1 tablet (8 mg total) by mouth every 8 (eight) hours as needed for nausea or vomiting., Disp: 10 tablet, Rfl: 0 .  oxyCODONE-acetaminophen (PERCOCET) 10-325 MG tablet, Take 1 tablet by mouth every 8 (eight) hours as needed for pain., Disp: 90 tablet, Rfl: 0 .  pantoprazole (PROTONIX) 40 MG tablet, Take 1 tablet (40 mg total) by mouth daily., Disp: 90 tablet, Rfl: 1 .  polyethylene glycol (MIRALAX / GLYCOLAX) packet, MIX 1 PACKET IN 8OZ OF LIQUID EVERY DAY, Disp: 14 each, Rfl: 0 .  triamcinolone (KENALOG) 0.025 % ointment, APPLY TOPICALLY TO LEFT ARM 2 TIMES A DAY UNTIL RASH AND ITCHING RESOLVES, Disp: 30 g, Rfl: 0 .  UNIFINE PENTIPS 31G X 5 MM MISC, USE TO INJECT INSULIN TWO TIMES DAILY, Disp: 100 each, Rfl: 0 Allergies  Allergen Reactions  . Ace Inhibitors Anaphylaxis     Swelling of the tongue and throat   . Clindamycin Anaphylaxis and Swelling  . Enalapril Maleate Anaphylaxis    Swelling of the tongue and throat   . Lisinopril Anaphylaxis    Swelling of the tongue and throat      Social History   Social History  . Marital status: Single    Spouse name: N/A  . Number of children: 2  . Years of education: 9th grade   Occupational History  . Unemployed     planning on getting disability   Social History Main Topics  . Smoking status: Never Smoker  . Smokeless tobacco: Never Used  . Alcohol use No  . Drug use: No  . Sexual activity: Not on file   Other Topics Concern  . Not on file   Social History Narrative   Lives in [place] with her son. Is able to read and write fluently in  English.    Physical Exam  Eyes: Pupils are equal, round, and reactive to light.  Pulmonary/Chest: No respiratory distress. She has no wheezes. She has no rales.  Abdominal: She exhibits no distension. There is no tenderness. There is no guarding.  Musculoskeletal: She exhibits no edema.  Skin: Skin is warm and dry. She is not diaphoretic.        SAFE - 01/27/17 1000      Situation   Admitting diagnosis chf   Heart failure history Exisiting   Readmitted within 30 days No   Hospital admission within past 12 months Yes   number of hospital admissions 1   number of ED visits 1   Target Weight 200 lbs     Assessment   Lives alone Yes   Primary support person daughter peace noyes   Mode of transportation personal car   Other services involved Other   Home equipement Cane;Scale;Walker     Weight   Weighs self daily Yes   Scale provided Yes   Records on weight chart Yes     Resources   Has "Living better w/heart failure" book Yes   Has HF Zone tool Yes   Able to identify yellow zone signs/when to call MD Yes   Records zone daily Yes     Medications   Uses a pill box Yes   Who stocks the pill box home aide   Pill box checked this visit  Yes   Pill box refilled this visit No   Difficulty obtaining medications No   Mail order medications Yes   New medications at home today No   Which ones n   Next scheduled delivery of medications 02/01/17   Missed one or more doses of medications per week Yes   How many missed doses this week 1     Nutrition   Patient receives meals on wheels No   Patient follows low sodium diet Yes   Has foods at home that meet the current recommended diet Yes   Patient follows low sugar/card diet Yes   Nutritional concerns/issues none     Activity Level   ADL's/Mobility Independent   How many feet can patient ambulate doesn't happen since she got pacemaker   Typical activity level normal   Barriers left side deficits due stroke 2015   Actvity tolerance: NYHA Class 4     Urine   Difficulty urinating No   Changes in urine None     Time spent with patient   Time spent with patient  60 Minutes        Future Appointments Date Time Provider [place]  03/28/2017 1:00 PM Chauncey Reading Plyler, RD [facility]  03/28/2017 2:15 PM Minus Liberty, MD [facility]  04/05/2017 10:00 AM Bayard Hugger, NP CPR-PR[facility] CPR  04/18/2017 2:20 PM Meredith Staggers, MD CPR[facility] CPR  05/02/2017 10:00 AM [facility] CLINIC [facility] None  05/03/2017 11:00 AM Silverio Decamp, MD PCK-PCK None  06/10/2017 11:30 AM Deboraha Sprang, MD [facility]   ATF pt CAO x4 with no complaints today.  Pts home aide has been out sick for the past days.  Pt's pill box empty due to not being home at our scheduled time. Pt has been without medications for two days due to same. Pt has no complaints today.  Pt denies sob, headaches, dizziness and chest pain.  Pts boyfriend stated that he has been cooking pt foods low in sodium and pt is watching  her sodium intake more now.  rx bottles and pill box verified.   Called in: Pantoprazole vesicare spirolactone  BP (!) 142/96 (BP Location: Right Arm, Patient Position:  Sitting, Cuff Size: Large)   Pulse 98   Resp 16   Wt 251 lb (113.9 kg)   SpO2 98%   BMI 37.07 kg/m   Weight yesterday-pt did not weigh Last visit weight-249    ACTION: Home visit completed

## 2017-03-18 NOTE — Telephone Encounter (Signed)
Patient requesting a refill on    pantoprazole (PROTONIX) 40 MG tablet. She would like her refill to be called in to [facility] pharmacy on  [address] [place] [address] [phone].

## 2017-03-24 ENCOUNTER — Other Ambulatory Visit (HOSPITAL_COMMUNITY): Payer: Self-pay

## 2017-03-24 ENCOUNTER — Other Ambulatory Visit: Payer: Self-pay | Admitting: *Deleted

## 2017-03-24 DIAGNOSIS — I5042 Chronic combined systolic (congestive) and diastolic (congestive) heart failure: Secondary | ICD-10-CM

## 2017-03-24 MED ORDER — RIVAROXABAN 20 MG PO TABS: 20.0000 mg | ORAL_TABLET | Freq: Every day | ORAL | 3 refills | Status: DC

## 2017-03-24 MED ORDER — SPIRONOLACTONE 50 MG PO TABS: 50.0000 mg | ORAL_TABLET | Freq: Every day | ORAL | 1 refills | Status: DC

## 2017-03-24 NOTE — Progress Notes (Signed)
Paramedicine Encounter    Patient ID: [name], female    DOB: Jan 18, 1966, 51 y.o.   MRN: [id number]    Patient Care Team: Minus Liberty, MD as PCP - General  Patient Active Problem List   Diagnosis Date Noted  . Chronic use of opiate for therapeutic purpose 12/14/2016  . Lumbago 11/22/2016  . Vaginal odor 09/10/2016  . Papular eruption 07/14/2016  . Type 2 diabetes mellitus, uncontrolled, with neuropathy ([place]) 02/04/2016  . Contact dermatitis 02/03/2016  . Left spastic hemiparesis ([place]) 09/23/2015  . Constipation 09/09/2015  . Left shoulder pain 06/30/2015  . Localization-related symptomatic epilepsy and epileptic syndromes with complex partial seizures, not intractable, without status epilepticus ([place]) 06/13/2015  . Hemiparesis affecting left side as late effect of cerebrovascular accident ([place]) 06/13/2015  . Morbid obesity ([place]) 06/13/2015  . Ulcer of left foot ([place]) 06/11/2015  . Candidal intertrigo 11/05/2014  . History of seizure 10/14/2014  . Vitamin D deficiency 10/01/2014  . Tachycardia 07/04/2014  . Generalized weakness 07/04/2014  . Perimenopausal menorrhagia 12/20/2013  . Stroke ([place]) 12/13/2013  . Biventricular implantable cardiac defibrillator -Medtronic 11/23/2013  . Mixed stress and urge urinary incontinence 10/11/2013  . Preventative health care 09/13/2013  . Chronic combined systolic and diastolic heart failure ([place]) 08/01/2013  . Nonischemic cardiomyopathy ([place]) 08/01/2013  . Goiter 08/01/2013  . LBBB (left bundle branch block) 07/19/2013  . Depression 04/09/2013  . Primary osteoarthritis of left knee 04/14/2012  . Lumbar spondylosis 04/14/2012  . Diabetic peripheral neuropathy ([place]) 04/14/2012  . Hyperlipidemia 11/23/2011  . OSA (obstructive sleep apnea) 08/27/2011  . Benign essential HTN 08/27/2010    Current Outpatient Prescriptions:  .  ACCU-CHEK FASTCLIX LANCETS MISC, Check blood sugar 3 times a day, Disp: 102 each, Rfl: 12 .   ACCU-CHEK SMARTVIEW test strip, USE TO CHECK BLOOD SUGAR THREE TIMES DAILY, Disp: 100 each, Rfl: 12 .  amLODipine (NORVASC) 10 MG tablet, TAKE 1 TABLET BY MOUTH EVERY DAY, Disp: 90 tablet, Rfl: 3 .  atorvastatin (LIPITOR) 40 MG tablet, TAKE 1 TABLET BY MOUTH EVERY DAY, Disp: 30 tablet, Rfl: 3 .  cyclobenzaprine (FLEXERIL) 10 MG tablet, TAKE ONE TABLET BY MOUTH 3 TIMES A DAY AS NEEDED FOR MUSCLE SPASMS, Disp: 90 tablet, Rfl: 0 .  DIGOX 125 MCG tablet, TAKE ONE TABLET BY MOUTH EVERY DAY, Disp: 30 tablet, Rfl: 11 .  docusate sodium (COLACE) 50 MG capsule, Take 1 capsule (50 mg total) by mouth 2 (two) times daily as needed for mild constipation., Disp: 60 capsule, Rfl: 2 .  furosemide (LASIX) 40 MG tablet, Take 1 tablet (40 mg total) by mouth 2 (two) times a week. Every Monday and Friday, Disp: 10 tablet, Rfl: 2 .  gabapentin (NEURONTIN) 300 MG capsule, Take 1 capsule (300 mg total) by mouth 2 (two) times daily., Disp: 60 capsule, Rfl: 2 .  Blood Glucose Monitoring Suppl (ACCU-CHEK GUIDE) w/Device KIT, 1 each by Does not apply route 3 (three) times daily., Disp: 1 kit, Rfl: 1 .  calamine lotion, Apply 1 application topically as needed for itching., Disp: 120 mL, Rfl: 0 .  citalopram (CELEXA) 20 MG tablet, TAKE 1 TABLET BY MOUTH EVERY DAY, Disp: 90 tablet, Rfl: 2 .  diclofenac sodium (VOLTAREN) 1 % GEL, APPLY 2 GM TOPICALLY FOUR TIMES DAILY AS NEEDED FOR PAIN, Disp: 300 g, Rfl: 0 .  fluticasone (CUTIVATE) 0.05 % cream, Apply topically 2 (two) times daily. Dispense two tubes, Disp: 30 g, Rfl: 6 .  hydrALAZINE (APRESOLINE) 50  MG tablet, TAKE 1 & 1/2 TABLETS BY MOUTH 3 TIMES A DAY, Disp: 405 tablet, Rfl: 3 .  isosorbide mononitrate (IMDUR) 60 MG 24 hr tablet, TAKE 1 & 1/2 TABLETS BY MOUTH EVERY DAY, Disp: 45 tablet, Rfl: 2 .  LANTUS SOLOSTAR 100 UNIT/ML Solostar Pen, INJECT 45 UNITS INTO THE SKIN EVERY EVENING AT 10PM, Disp: 15 mL, Rfl: 6 .  levETIRAcetam (KEPPRA) 500 MG tablet, TAKE 1 TABLET BY MOUTH TWO  TIMES DAILY **MUST NOT MISS ANY DOSE**, Disp: 180 tablet, Rfl: 1 .  metFORMIN (GLUCOPHAGE XR) 500 MG 24 hr tablet, Take 1 tablet (500 mg total) by mouth 2 (two) times daily., Disp: 60 tablet, Rfl: 11 .  metoprolol succinate (TOPROL-XL) 100 MG 24 hr tablet, TAKE 1 TABLET BY MOUTH TWO TIMES DAILY, Disp: 60 tablet, Rfl: 2 .  mupirocin cream (BACTROBAN) 2 %, Apply 1 application topically 2 (two) times daily., Disp: 15 g, Rfl: 2 .  nystatin (NYSTATIN) powder, Apply to affected area twice daily until healed., Disp: 30 g, Rfl: 2 .  nystatin cream (MYCOSTATIN), APPLY UNDER ABDOMINAL SKIN AND ON BUTTOCKS 2 TIMES A DAY UNTIL RASH RESOLVES, Disp: 30 g, Rfl: 0 .  ondansetron (ZOFRAN ODT) 8 MG disintegrating tablet, Take 1 tablet (8 mg total) by mouth every 8 (eight) hours as needed for nausea or vomiting., Disp: 10 tablet, Rfl: 0 .  oxyCODONE-acetaminophen (PERCOCET) 10-325 MG tablet, Take 1 tablet by mouth every 8 (eight) hours as needed for pain., Disp: 90 tablet, Rfl: 0 .  pantoprazole (PROTONIX) 40 MG tablet, Take 1 tablet (40 mg total) by mouth daily., Disp: 90 tablet, Rfl: 1 .  polyethylene glycol (MIRALAX / GLYCOLAX) packet, MIX 1 PACKET IN 8OZ OF LIQUID EVERY DAY, Disp: 14 each, Rfl: 0 .  rivaroxaban (XARELTO) 20 MG TABS tablet, Take 1 tablet (20 mg total) by mouth daily with supper., Disp: 30 tablet, Rfl: 3 .  solifenacin (VESICARE) 10 MG tablet, Take 10 mg by mouth daily., Disp: , Rfl:  .  spironolactone (ALDACTONE) 50 MG tablet, Take 1 tablet (50 mg total) by mouth daily., Disp: 90 tablet, Rfl: 1 .  triamcinolone (KENALOG) 0.025 % ointment, APPLY TOPICALLY TO LEFT ARM 2 TIMES A DAY UNTIL RASH AND ITCHING RESOLVES, Disp: 30 g, Rfl: 0 .  UNIFINE PENTIPS 31G X 5 MM MISC, USE TO INJECT INSULIN TWO TIMES DAILY, Disp: 100 each, Rfl: 0 .  Vitamin D, Ergocalciferol, (DRISDOL) 50000 units CAPS capsule, TAKE ONE CAPSULE BY MOUTH EVERY MONDAY, Disp: 4 capsule, Rfl: 0 Allergies  Allergen Reactions  . Ace  Inhibitors Anaphylaxis    Swelling of the tongue and throat   . Clindamycin Anaphylaxis and Swelling  . Enalapril Maleate Anaphylaxis    Swelling of the tongue and throat   . Lisinopril Anaphylaxis    Swelling of the tongue and throat      Social History   Social History  . Marital status: Single    Spouse name: N/A  . Number of children: 2  . Years of education: 9th grade   Occupational History  . Unemployed     planning on getting disability   Social History Main Topics  . Smoking status: Never Smoker  . Smokeless tobacco: Never Used  . Alcohol use No  . Drug use: No  . Sexual activity: Not on file   Other Topics Concern  . Not on file   Social History Narrative   Lives in [place] with her son. Is able to read  and write fluently in [place].    Physical Exam  Pulmonary/Chest: No respiratory distress. She has no wheezes. She has no rales.  Abdominal: She exhibits no distension. There is no tenderness. There is no guarding.  Musculoskeletal: She exhibits no edema.  Skin: Skin is warm and dry. She is not diaphoretic.        SAFE - 01/27/17 1000      Situation   Admitting diagnosis chf   Heart failure history Exisiting   Readmitted within 30 days No   Hospital admission within past 12 months Yes   number of hospital admissions 1   number of ED visits 1   Target Weight 200 lbs     Assessment   Lives alone Yes   Primary support person daughter malyia moro   Mode of transportation personal car   Other services involved Other   Home equipement Cane;Scale;Walker     Weight   Weighs self daily Yes   Scale provided Yes   Records on weight chart Yes     Resources   Has "Living better w/heart failure" book Yes   Has HF Zone tool Yes   Able to identify yellow zone signs/when to call MD Yes   Records zone daily Yes     Medications   Uses a pill box Yes   Who stocks the pill box home aide   Pill box checked this visit Yes   Pill box refilled this  visit No   Difficulty obtaining medications No   Mail order medications Yes   New medications at home today No   Which ones n   Next scheduled delivery of medications 02/01/17   Missed one or more doses of medications per week Yes   How many missed doses this week 1     Nutrition   Patient receives meals on wheels No   Patient follows low sodium diet Yes   Has foods at home that meet the current recommended diet Yes   Patient follows low sugar/card diet Yes   Nutritional concerns/issues none     Activity Level   ADL's/Mobility Independent   How many feet can patient ambulate doesn't happen since she got pacemaker   Typical activity level normal   Barriers left side deficits due stroke 2015   Actvity tolerance: NYHA Class 4     Urine   Difficulty urinating No   Changes in urine None     Time spent with patient   Time spent with patient  60 Minutes        Future Appointments Date Time Provider [place]  03/28/2017 1:00 PM [place], RD [facility]  03/28/2017 2:15 PM Minus Liberty, MD [facility]  04/05/2017 10:00 AM Bayard Hugger, NP [facility] CPR  04/18/2017 2:20 PM Meredith Staggers, MD [facility] CPR  05/02/2017 10:00 AM [facility] CLINIC [facility] None  05/03/2017 11:00 AM Silverio Decamp, MD [facility] None  06/10/2017 11:30 AM Deboraha Sprang, MD [facility]   ATF pt CAO x4 sitting on the bed with no complaints. Pt stated that she is eating better and has been taking her medications as prescribed.  rx bottles verified and pill box verified.  Pt has several full bottles in a nearby box.    BP (!) 142/98 (BP Location: Left Arm, Patient Position: Sitting, Cuff Size: Large)   Pulse 92   Resp 16   Wt 250 lb (113.4 kg)   SpO2 99%   BMI 36.92  kg/m   Weight yesterday-250 Last visit weight-251    Krishav Mamone, EMT Paramedic 03/24/2017    ACTION: Home visit completed

## 2017-03-24 NOTE — Telephone Encounter (Signed)
Patient completely out of Xarelto & Spironolactone.

## 2017-03-28 ENCOUNTER — Ambulatory Visit (INDEPENDENT_AMBULATORY_CARE_PROVIDER_SITE_OTHER): Payer: Medicaid Other | Admitting: Internal Medicine

## 2017-03-28 ENCOUNTER — Ambulatory Visit (INDEPENDENT_AMBULATORY_CARE_PROVIDER_SITE_OTHER): Payer: Medicaid Other | Admitting: Dietician

## 2017-03-28 ENCOUNTER — Encounter: Payer: Self-pay | Admitting: Internal Medicine

## 2017-03-28 VITALS — BP 118/77 | HR 90 | Temp 98.2°F | Wt 253.7 lb

## 2017-03-28 DIAGNOSIS — G40909 Epilepsy, unspecified, not intractable, without status epilepticus: Secondary | ICD-10-CM

## 2017-03-28 DIAGNOSIS — Z95810 Presence of automatic (implantable) cardiac defibrillator: Secondary | ICD-10-CM

## 2017-03-28 DIAGNOSIS — N915 Oligomenorrhea, unspecified: Secondary | ICD-10-CM

## 2017-03-28 DIAGNOSIS — Z79899 Other long term (current) drug therapy: Secondary | ICD-10-CM

## 2017-03-28 DIAGNOSIS — L988 Other specified disorders of the skin and subcutaneous tissue: Secondary | ICD-10-CM | POA: Diagnosis not present

## 2017-03-28 DIAGNOSIS — I110 Hypertensive heart disease with heart failure: Secondary | ICD-10-CM | POA: Diagnosis not present

## 2017-03-28 DIAGNOSIS — E1140 Type 2 diabetes mellitus with diabetic neuropathy, unspecified: Secondary | ICD-10-CM

## 2017-03-28 DIAGNOSIS — B372 Candidiasis of skin and nail: Secondary | ICD-10-CM

## 2017-03-28 DIAGNOSIS — Z0000 Encounter for general adult medical examination without abnormal findings: Secondary | ICD-10-CM

## 2017-03-28 DIAGNOSIS — I5042 Chronic combined systolic (congestive) and diastolic (congestive) heart failure: Secondary | ICD-10-CM | POA: Diagnosis not present

## 2017-03-28 DIAGNOSIS — Z794 Long term (current) use of insulin: Secondary | ICD-10-CM

## 2017-03-28 DIAGNOSIS — N951 Menopausal and female climacteric states: Secondary | ICD-10-CM | POA: Insufficient documentation

## 2017-03-28 DIAGNOSIS — IMO0002 Reserved for concepts with insufficient information to code with codable children: Secondary | ICD-10-CM

## 2017-03-28 DIAGNOSIS — I69354 Hemiplegia and hemiparesis following cerebral infarction affecting left non-dominant side: Secondary | ICD-10-CM | POA: Diagnosis not present

## 2017-03-28 DIAGNOSIS — E1165 Type 2 diabetes mellitus with hyperglycemia: Secondary | ICD-10-CM

## 2017-03-28 DIAGNOSIS — Z6837 Body mass index (BMI) 37.0-37.9, adult: Secondary | ICD-10-CM

## 2017-03-28 DIAGNOSIS — I10 Essential (primary) hypertension: Secondary | ICD-10-CM

## 2017-03-28 DIAGNOSIS — Z713 Dietary counseling and surveillance: Secondary | ICD-10-CM

## 2017-03-28 DIAGNOSIS — E1142 Type 2 diabetes mellitus with diabetic polyneuropathy: Secondary | ICD-10-CM | POA: Diagnosis not present

## 2017-03-28 DIAGNOSIS — G40209 Localization-related (focal) (partial) symptomatic epilepsy and epileptic syndromes with complex partial seizures, not intractable, without status epilepticus: Secondary | ICD-10-CM

## 2017-03-28 HISTORY — DX: Menopausal and female climacteric states: N95.1

## 2017-03-28 LAB — GLUCOSE, CAPILLARY: Glucose-Capillary: 170 mg/dL — ABNORMAL HIGH (ref 65–99)

## 2017-03-28 LAB — POCT GLYCOSYLATED HEMOGLOBIN (HGB A1C): HEMOGLOBIN A1C: 8.8

## 2017-03-28 MED ORDER — NYSTATIN 100000 UNIT/GM EX CREA: TOPICAL_CREAM | CUTANEOUS | 3 refills | Status: DC

## 2017-03-28 MED ORDER — INSULIN GLARGINE 100 UNIT/ML SOLOSTAR PEN: 50.0000 [IU] | PEN_INJECTOR | Freq: Every day | SUBCUTANEOUS | 6 refills | Status: DC

## 2017-03-28 MED ORDER — ZINC OXIDE 40 % EX OINT: 1.0000 "application " | TOPICAL_OINTMENT | CUTANEOUS | 3 refills | Status: DC | PRN

## 2017-03-28 MED ORDER — METFORMIN HCL ER 500 MG PO TB24: 2000.0000 mg | ORAL_TABLET | Freq: Every day | ORAL | 11 refills | Status: DC

## 2017-03-28 MED ORDER — HYDROCORTISONE 1 % EX CREA: TOPICAL_CREAM | CUTANEOUS | 1 refills | Status: AC

## 2017-03-28 NOTE — Progress Notes (Signed)
Diabetes Self-Management Education  Visit Type: (P) First/Initial  Appt. Start Time: 1310 Appt. End Time: 1410  03/28/2017  [name], identified by name and date of birth, is a 51 y.o. female with a diagnosis of Diabetes: (P) Type 2.   ASSESSMENT  revious visit was 2014 x2  Assessment:  Primary concerns today: glycemic control- wants hr a1C to be in the 6% range.   has dentist appointment in June to have the rest of her teeth extracted- all her uppers so she can have full set of dentures. She has lower dentures, but they do not work well with her uppers. She just finished amoxacillin for her her tooth infection. She has trouble with constipation since her stroke. She thinks her decreased appetite lately might be a result of both these things. She says the home health nurses are trying to get her to eat moreShe has residual left sided paralysis.  Preferred Learning Style: No preference indicated  Learning Readiness: Ready and change in progress  ANTHROPOMETRICS: weight-253#, BMI-37 obese class II WEIGHT HISTORY: Highest: 299# Lowest- current SLEEP:need to assess at future visit Constipation:yes- not even a bowel movements weekly without a laxative MEDICATIONS: lantus in the morning, stopped metformin because it gives her diarrhea, but [plans to restart,  BLOOD SUGAR:meter downloaded- clock was 3 days and 12 hours off making it appear that she cheks her blood sugar in the Pm when actually she chekces them in the am. average 238, she checks ~ 1x/day or less in the morning, her pattern is flat with  no rise in CBGs in the PM. Ger fasting is above target except for 4 days occurring one time a week DIETARY INTAKE: Usual eating pattern includes 2 meals and 1-2 snacks per day. Everyday foods include crackers, pintos,yogurt,  bananas, pears, green beans, pintos, corn, peas, chicken and beef Avoided foods include lunchmeat  24-hr recall:  B (10 AM): cheerios and milk or skips L ( 1-2  PM): potatoes, cabbage & chicken D ( PM): [place] chicken rice, vegetables Snk ( PM): peanut butter crackers- prepacked Beverages: sweet water with 22 grams sugar per 8 Oz. This has helped her to stop drinking soda.   Usual physical activity: ADLs. Has one CBG in target one time a week over the past month. She thinks this may be from her increased activity [place] [employer] [place]  Last menstrual period 03/28/2017.      Diabetes Self-Management Education - 03/28/17 1500      Visit Information   Visit Type (P)  First/Initial     Initial Visit   Diabetes Type (P)  Type 2   Are you currently following a meal plan? (P)  No   Are you taking your medications as prescribed? (P)  No     Health Coping   How would you rate your overall health? (P)  Fair     Psychosocial Assessment   Patient Belief/Attitude about Diabetes (P)  Motivated to manage diabetes   Self-care barriers (P)  Lack of material resources   Self-management support (P)  Doctor's office;Family;CDE visits   Other persons present (P)  Family Member   Patient Concerns (P)  Glycemic Control   Special Needs (P)  None   Preferred Learning Style (P)  No preference indicated   Learning Readiness (P)  Ready   How often do you need to have someone help you when you read instructions, pamphlets, or other written materials from your doctor or pharmacy? (  P)  2 - Rarely     Pre-Education Assessment   Patient understands incorporating nutritional management into lifestyle. (P)  Needs Review   Patient undertands incorporating physical activity into lifestyle. (P)  Needs Review   Patient understands monitoring blood glucose, interpreting and using results (P)  Needs Review   Patient understands how to develop strategies to promote health/change behavior. (P)  Needs Review     Complications   Last HgB A1C per patient/outside source (P)  8 %   How often do you check your blood sugar? (P)  1-2 times/day   Fasting Blood  glucose range (mg/dL) (P)  >220   Postprandial Blood glucose range (mg/dL) (P)  254-270   Number of hypoglycemic episodes per month (P)  0   Number of hyperglycemic episodes per week (P)  23   Can you tell when your blood sugar is high? (P)  No   Have you had a dilated eye exam in the past 12 months? (P)  No   Have you had a dental exam in the past 12 months? (P)  Yes   Are you checking your feet? (P)  Yes      Individualized Plan for Diabetes Self-Management Training:   Learning Objective:  Patient will have a greater understanding of diabetes self-management. Patient education plan is to attend individual and/or group sessions per assessed needs and concerns.   Plan:   Patient Instructions  Congrats on lowering your A1C to 8.8%!  To help lower your blood sugar more and help your constipation try to:  1- Drink more liquids with less to no sugar- milk, hot tea, cold tea (green tea is good) water with a splash of 100% juice to flavor it or can try a bit of lemon or lime juice with Splenda in it.   2- Eat 25 grams of fiber each day by eating bran flakes, sprinkled with fruit for breakfast, 1-2 cup fruits and vegetables for lunch and breakfast. See fiber handouts   3- If needed to get more fiber can use a fiber supplement like Fibercon, Metamucil or Benefiber daily. Follow directions on container to increase gradually AND drink plenty of water with it.    Please make a follow up on the same day as your next appointment to keep chiseling away at that A1C until it is at your goal.    Expected Outcomes:     Education material provided: My Plate  If problems or questions, patient to contact team via:  Phone  Future DSME appointment:   Iin 3 month(s).  Ariyel Jeangilles, [name], RD 03/28/2017 3:35 PM.

## 2017-03-28 NOTE — Assessment & Plan Note (Addendum)
Lab Results  Component Value Date   HGBA1C 8.8 03/28/2017   HGBA1C 9.1 11/22/2016   HGBA1C 8.5 09/10/2016    Recent Labs  03/28/17 1332  GLUCAP 170*    Current medications: metformin XR 1000 mg daily Current insulin: Lantus 45U QHS  Previous medications: metformin (not tolerated due to diarrhea)  Reviewed glucometer log.  AM fasting BGs average 243, low ~150.  Assessment HgbA1c goal: <7.0% Glycemic control: uncontrolled Complications: peripheral neuropathy  Plan Medications: Increase Metformin XR to 2000 mg daily Insulin: Increase Lantus to 50U QHS Other: -A1c today -referall to optometry for DR screening -Talked to [employer] RD about diet today -F/u by phone in 2 weeks to check AM BGs

## 2017-03-28 NOTE — Patient Instructions (Signed)
For your diabetes, go up to taking 50U of Lantus and 2000 mg metformin (4 pills) daily.  Your blood pressure is doing great!  For the irritation in your skin folds, I have prescribed more nystatin as well as hydrocortisone cream for itching.  Use these until the rash improves.  You can also mix it with Desitin to keep the areas dry.  You can keep using the Desitin even after the rash is gone.  Please talk to the provider of your prosthetic and Dr Riley Kill about irritation from your brace.  I've put in referrals for optometry, colonoscopy, and mammogram.

## 2017-03-28 NOTE — Assessment & Plan Note (Signed)
Rash with ulceration in skin folds of her pareitic side.  Consistent with candidal intertrigo, risk factors of obesity and hemiparesis.  -Nystatin cream -Hydrocortisone -Desitin

## 2017-03-28 NOTE — Patient Instructions (Addendum)
Congrats on lowering your A1C to 8.8%!  To help lower your blood sugar more and help your constipation try to:  1- Drink more liquids with less to no sugar- milk, hot tea, cold tea (green tea is good) water with a splash of 100% juice to flavor it or can try a bit of lemon or lime juice with Splenda in it.   2- Eat 25 grams of fiber each day by eating bran flakes, sprinkled with fruit for breakfast, 1-2 cup fruits and vegetables for lunch and breakfast. See fiber handouts   3- If needed to get more fiber can use a fiber supplement like Fibercon, Metamucil or Benefiber daily. Follow directions on container to increase gradually AND drink plenty of water with it.    Please make a follow up on the same day as your next appointment to keep chiseling away at that A1C until it is at your goal.

## 2017-03-28 NOTE — Assessment & Plan Note (Signed)
Saw Heart Failure 01/31/2017, prescribed lasix 40 mg twice weekly.  Echocardiogram EF 45% G1DD, improved from 25-30% in 10/2015.  Currents meds: metoprolol succinate 200 mg daily, spironolactone 50 mg daily, digoxin 0.125 mg daily  She is not on ACEI/ARB due to history of angioedema  Euvolemic today without symptoms of heart failure decompensation. -Continue current meds -Follow up with Heart Failure as recommended

## 2017-03-28 NOTE — Progress Notes (Signed)
CC: "I have a rash on my abdomen."  HPI:  [name] is a 51 y.o. woman with history of HTN, DM2, nonischemic CHF (EF 25-30%, w/ ICD), and CVA (with residual left hemiparesis) who presents for management of diabetes.  Please see A&P for status of the patient's chronic medical conditions.  Past Medical History:  Diagnosis Date  . Allergic rhinitis   . Arthritis    "hips, back, legs, arms" (07/04/2014)  . Asthma    hx  . Automatic implantable cardioverter-defibrillator in situ   . Calcifying tendinitis of shoulder   . Chronic combined systolic and diastolic CHF (congestive heart failure) (HCC)    EF 40-45% by echo 12/06/2012  . Chronic diastolic heart failure (HCC)     Primarily diastolic CHF: Likely due to uncontrolled HTN. Last echo (8/12) with EF 45-50%, mild to moderate LVH with some asymmetric septal hypertrophy, RV normal size and systolic function. EF 50-55% by LV-gram in 6/12.   . Chronic lower back pain    secondary to DJD, obsetiy, hip problems. Followed by Dr. Ivory Broad (pain management)  . Coronary artery disease    questionable. LHC 05/2011 showing normal coronaries // Followed at [facility], Dr. Shirlee Latch  . Degeneration of lumbar or lumbosacral intervertebral disc   . DJD (degenerative joint disease) of hip    right sided  . Frequent UTI   . GERD (gastroesophageal reflux disease)   . HLD (hyperlipidemia)   . Hypertension    Poorly controlled. Has had HTN since age 32. Angioedema with ACEI.  24 Hr urine and renal arterial dopplers ordered . . . Never done  . LBBB (left bundle branch block)   . Liver disease   . Morbid obesity (HCC)   . NICM (nonischemic cardiomyopathy) (HCC)    EF 45-50% in 8/12, cath 6/12 showed normal coronaries, EF 50-55% by LV gram  . OSA on CPAP    sleep study in 8/12 showed moderate to severe OSA requiring CPAP  . Polyneuropathy in diabetes(357.2)   . Presence of permanent cardiac pacemaker   . Seizures (HCC)    last 3  months  . Shortness of breath    none now  . Stroke [facility]) 12/2013   "my left side is paralyzed" (07/04/2014)  . Thoracic or lumbosacral neuritis or radiculitis, unspecified   . Type II diabetes mellitus (HCC) DX: 2002   Review of Systems:  Review of Systems  Constitutional: Negative for chills and fever.  Eyes: Positive for blurred vision.  Cardiovascular: Negative for chest pain and palpitations.  Gastrointestinal: Negative for abdominal pain, diarrhea, nausea and vomiting.  Skin: Positive for itching and rash.  Neurological: Positive for seizures.     Physical Exam:  Vitals:   03/28/17 1414 03/28/17 1451  BP: (!) 151/88 118/77  Pulse: 90 90  Temp: 98.2 F (36.8 C)   TempSrc: Oral   SpO2: 100%   Weight: 253 lb 11.2 oz (115.1 kg)      Physical Exam  Constitutional: She is oriented to person, place, and time.  Sitting in wheelchair in no distress, left facial droop and left hemiparesis  Cardiovascular: Normal rate and regular rhythm.   Pulmonary/Chest: Effort normal and breath sounds normal. She has no wheezes. She has no rales.  Neurological: She is alert and oriented to person, place, and time.  Left hemiparesis  Skin:  Ulceration, erythema, and hyperpigmentation under left pannus  Psychiatric: She has a normal mood and affect. Her behavior is normal.  Assessment & Plan:   See Encounters Tab for problem based charting.  Patient discussed with Dr. Lynnae January

## 2017-03-28 NOTE — Assessment & Plan Note (Addendum)
Body mass index is 37.46 kg/m.

## 2017-03-28 NOTE — Assessment & Plan Note (Addendum)
Referral for colonoscopy, mammogram, and diabetic retinopathy screening  On period today, pap deferred.  Pneumovax offered, patient declined.  Concerned about getting sick from vaccine.  Counseled regarding risks and benefits of pneumococcal vaccines.

## 2017-03-28 NOTE — Assessment & Plan Note (Signed)
Reports one brief seizure, characterized by head turning, a couple of weeks ago.  No loss of consciousness, took her keppra as prescribed.  Prior to that her last seizure was about a year ago.  Possible localized seizure in patient with history of seizures post-stroke. Overall, no clinical significant seizure activity. -Continue to monitor for symptoms -Continue Keppra 500 mg BID

## 2017-03-28 NOTE — Assessment & Plan Note (Addendum)
BP Readings from Last 3 Encounters:  03/28/17 118/77  03/24/17 (!) 142/98  03/18/17 (!) 142/96   Lab Results  Component Value Date   CREATININE 0.63 01/31/2017   Lab Results  Component Value Date   K 3.8 01/31/2017    Current medications: amlodipine 10 mg daily, hydralazine 75 mg TID, Imdur 60 mg daily, metoprolol succinate 200 mg daily, spironolactone 50 mg daily  Now taking meds regulary.  Paramedicine fills pill box.  Assessment BP goal: 140/90 BP control: well controlled  Plan  Medications: continue current meds Other:

## 2017-03-28 NOTE — Assessment & Plan Note (Signed)
Currently having menstrual bleeding, last period about 6 months ago, before that several month interval.  This is consistent with her normal periods, bleeding lasting 3-4 days and 2-3 pads per day.  No vasoactive symptoms.  Irregular menstrual bleeding in 51 year old woman most likely perimenopausal.  She is not up to date on her cervical cancer screening and needs pap when not menstruating.  -pap smear next visit

## 2017-03-29 NOTE — Progress Notes (Signed)
Internal Medicine Clinic Attending  Case discussed with Dr. O'Sullivan at the time of the visit.  We reviewed the resident's history and exam and pertinent patient test results.  I agree with the assessment, diagnosis, and plan of care documented in the resident's note. 

## 2017-03-31 ENCOUNTER — Other Ambulatory Visit (HOSPITAL_COMMUNITY): Payer: Self-pay

## 2017-03-31 NOTE — Progress Notes (Signed)
Paramedicine Encounter    Patient ID: [name], female    DOB: [birth year], 51 y.o.   MRN: [id number]    Patient Care Team: Minus Liberty, MD as PCP - General  Patient Active Problem List   Diagnosis Date Noted  . Perimenopausal 03/28/2017  . Chronic use of opiate for therapeutic purpose 12/14/2016  . Lumbago 11/22/2016  . Vaginal odor 09/10/2016  . Papular eruption 07/14/2016  . Type 2 diabetes mellitus, uncontrolled, with neuropathy ([place]) 02/04/2016  . Left spastic hemiparesis ([place]) 09/23/2015  . Constipation 09/09/2015  . Left shoulder pain 06/30/2015  . Localization-related symptomatic epilepsy and epileptic syndromes with complex partial seizures, not intractable, without status epilepticus ([place]) 06/13/2015  . Hemiparesis affecting left side as late effect of cerebrovascular accident ([place]) 06/13/2015  . Morbid obesity ([place]) 06/13/2015  . Ulcer of left foot ([place]) 06/11/2015  . Candidal intertrigo 11/05/2014  . History of seizure 10/14/2014  . Vitamin D deficiency 10/01/2014  . Tachycardia 07/04/2014  . Generalized weakness 07/04/2014  . Perimenopausal menorrhagia 12/20/2013  . Stroke ([place]) 12/13/2013  . Biventricular implantable cardiac defibrillator -Medtronic 11/23/2013  . Mixed stress and urge urinary incontinence 10/11/2013  . Health care maintenance 09/13/2013  . Chronic combined systolic and diastolic heart failure ([place]) 08/01/2013  . Nonischemic cardiomyopathy ([place]) 08/01/2013  . LBBB (left bundle branch block) 07/19/2013  . Depression 04/09/2013  . Primary osteoarthritis of left knee 04/14/2012  . Lumbar spondylosis 04/14/2012  . Diabetic peripheral neuropathy ([place]) 04/14/2012  . Hyperlipidemia 11/23/2011  . OSA (obstructive sleep apnea) 08/27/2011  . Benign essential HTN 08/27/2010    Current Outpatient Prescriptions:  .  ACCU-CHEK FASTCLIX LANCETS MISC, Check blood sugar 3 times a day, Disp: 102 each, Rfl: 12 .  ACCU-CHEK SMARTVIEW test strip,  USE TO CHECK BLOOD SUGAR THREE TIMES DAILY, Disp: 100 each, Rfl: 12 .  amLODipine (NORVASC) 10 MG tablet, TAKE 1 TABLET BY MOUTH EVERY DAY, Disp: 90 tablet, Rfl: 3 .  atorvastatin (LIPITOR) 40 MG tablet, TAKE 1 TABLET BY MOUTH EVERY DAY, Disp: 30 tablet, Rfl: 3 .  Blood Glucose Monitoring Suppl (ACCU-CHEK GUIDE) w/Device KIT, 1 each by Does not apply route 3 (three) times daily., Disp: 1 kit, Rfl: 1 .  calamine lotion, Apply 1 application topically as needed for itching., Disp: 120 mL, Rfl: 0 .  citalopram (CELEXA) 20 MG tablet, TAKE 1 TABLET BY MOUTH EVERY DAY, Disp: 90 tablet, Rfl: 2 .  cyclobenzaprine (FLEXERIL) 10 MG tablet, TAKE ONE TABLET BY MOUTH 3 TIMES A DAY AS NEEDED FOR MUSCLE SPASMS, Disp: 90 tablet, Rfl: 0 .  diclofenac sodium (VOLTAREN) 1 % GEL, APPLY 2 GM TOPICALLY FOUR TIMES DAILY AS NEEDED FOR PAIN, Disp: 300 g, Rfl: 0 .  DIGOX 125 MCG tablet, TAKE ONE TABLET BY MOUTH EVERY DAY, Disp: 30 tablet, Rfl: 11 .  docusate sodium (COLACE) 50 MG capsule, Take 1 capsule (50 mg total) by mouth 2 (two) times daily as needed for mild constipation., Disp: 60 capsule, Rfl: 2 .  fluticasone (CUTIVATE) 0.05 % cream, Apply topically 2 (two) times daily. Dispense two tubes, Disp: 30 g, Rfl: 6 .  furosemide (LASIX) 40 MG tablet, Take 1 tablet (40 mg total) by mouth 2 (two) times a week. Every Monday and Friday, Disp: 10 tablet, Rfl: 2 .  gabapentin (NEURONTIN) 300 MG capsule, Take 1 capsule (300 mg total) by mouth 2 (two) times daily., Disp: 60 capsule, Rfl: 2 .  hydrALAZINE (APRESOLINE) 50 MG tablet, TAKE 1 &  1/2 TABLETS BY MOUTH 3 TIMES A DAY, Disp: 405 tablet, Rfl: 3 .  hydrocortisone cream 1 %, Apply to itchy skin 2 times daily, Disp: 30 g, Rfl: 1 .  Insulin Glargine (LANTUS SOLOSTAR) 100 UNIT/ML Solostar Pen, Inject 50 Units into the skin daily at 10 pm., Disp: 15 mL, Rfl: 6 .  isosorbide mononitrate (IMDUR) 60 MG 24 hr tablet, TAKE 1 & 1/2 TABLETS BY MOUTH EVERY DAY, Disp: 45 tablet, Rfl: 2 .   levETIRAcetam (KEPPRA) 500 MG tablet, TAKE 1 TABLET BY MOUTH TWO TIMES DAILY **MUST NOT MISS ANY DOSE**, Disp: 180 tablet, Rfl: 1 .  liver oil-zinc oxide (DESITIN) 40 % ointment, Apply 1 application topically as needed for irritation. To skin folds, Disp: 56.7 g, Rfl: 3 .  metFORMIN (GLUCOPHAGE XR) 500 MG 24 hr tablet, Take 4 tablets (2,000 mg total) by mouth daily., Disp: 120 tablet, Rfl: 11 .  metoprolol succinate (TOPROL-XL) 100 MG 24 hr tablet, TAKE 1 TABLET BY MOUTH TWO TIMES DAILY, Disp: 60 tablet, Rfl: 2 .  mupirocin cream (BACTROBAN) 2 %, Apply 1 application topically 2 (two) times daily., Disp: 15 g, Rfl: 2 .  nystatin (NYSTATIN) powder, Apply to affected area twice daily until healed., Disp: 30 g, Rfl: 2 .  nystatin cream (MYCOSTATIN), APPLY UNDER ABDOMINAL SKIN AND ON BUTTOCKS 2 TIMES A DAY UNTIL RASH RESOLVES, Disp: 30 g, Rfl: 3 .  ondansetron (ZOFRAN ODT) 8 MG disintegrating tablet, Take 1 tablet (8 mg total) by mouth every 8 (eight) hours as needed for nausea or vomiting., Disp: 10 tablet, Rfl: 0 .  oxyCODONE-acetaminophen (PERCOCET) 10-325 MG tablet, Take 1 tablet by mouth every 8 (eight) hours as needed for pain., Disp: 90 tablet, Rfl: 0 .  pantoprazole (PROTONIX) 40 MG tablet, Take 1 tablet (40 mg total) by mouth daily., Disp: 90 tablet, Rfl: 1 .  polyethylene glycol (MIRALAX / GLYCOLAX) packet, MIX 1 PACKET IN 8OZ OF LIQUID EVERY DAY, Disp: 14 each, Rfl: 0 .  rivaroxaban (XARELTO) 20 MG TABS tablet, Take 1 tablet (20 mg total) by mouth daily with supper., Disp: 30 tablet, Rfl: 3 .  solifenacin (VESICARE) 10 MG tablet, Take 10 mg by mouth daily., Disp: , Rfl:  .  spironolactone (ALDACTONE) 50 MG tablet, Take 1 tablet (50 mg total) by mouth daily., Disp: 90 tablet, Rfl: 1 .  triamcinolone (KENALOG) 0.025 % ointment, APPLY TOPICALLY TO LEFT ARM 2 TIMES A DAY UNTIL RASH AND ITCHING RESOLVES, Disp: 30 g, Rfl: 0 .  UNIFINE PENTIPS 31G X 5 MM MISC, USE TO INJECT INSULIN TWO TIMES DAILY,  Disp: 100 each, Rfl: 0 .  Vitamin D, Ergocalciferol, (DRISDOL) 50000 units CAPS capsule, TAKE ONE CAPSULE BY MOUTH EVERY MONDAY, Disp: 4 capsule, Rfl: 0 Allergies  Allergen Reactions  . Ace Inhibitors Anaphylaxis    Swelling of the tongue and throat   . Clindamycin Anaphylaxis and Swelling  . Enalapril Maleate Anaphylaxis    Swelling of the tongue and throat   . Lisinopril Anaphylaxis    Swelling of the tongue and throat      Social History   Social History  . Marital status: Single    Spouse name: N/A  . Number of children: 2  . Years of education: 9th grade   Occupational History  . Unemployed     planning on getting disability   Social History Main Topics  . Smoking status: Never Smoker  . Smokeless tobacco: Never Used  . Alcohol use No  .  Drug use: No  . Sexual activity: Not on file   Other Topics Concern  . Not on file   Social History Narrative   Lives in [place] with her son. Is able to read and write fluently in [place].    Physical Exam  Eyes: Pupils are equal, round, and reactive to light.  Pulmonary/Chest: No respiratory distress. She has no wheezes. She has no rales.  Abdominal: She exhibits no distension. There is no tenderness. There is no guarding.  Musculoskeletal: She exhibits no edema.  Skin: Skin is warm and dry. She is not diaphoretic.        Future Appointments Date Time Provider [place]  04/05/2017 10:00 AM Bayard Hugger, NP [facility] CPR  04/18/2017 2:20 PM Meredith Staggers, MD [facility] CPR  05/02/2017 10:00 AM [facility] None  05/03/2017 11:00 AM Silverio Decamp, MD [facility] None  06/10/2017 11:30 AM Debor[facility] Sprang, MD [facility]    ATF pt CAO x4 sitting on the edge of her bed.  Pt has no complaints today, she stated that her aide had to quite and she has a new one.  Pt did not weigh yesterday because she didn't have anyone there to assist her. Pt has taken her medications without difficulty.  Pt has colace for the next 3 days; she stated that she will have one of her children buy her some more before Friday.  Pt denies sob, dizziness, headaches and chest pain.  rx bottles verified and pill box refilled.    LMP 03/28/2017   Weight yesterday-didn't weigh  Last visit weight-250    Audrey Thull, EMT Paramedic 03/31/2017    ACTION: Home visit completed Next visit planned for next wed

## 2017-04-04 ENCOUNTER — Encounter: Payer: Self-pay | Admitting: *Deleted

## 2017-04-05 ENCOUNTER — Telehealth: Payer: Self-pay | Admitting: *Deleted

## 2017-04-05 ENCOUNTER — Encounter: Payer: Medicaid Other | Attending: Physical Medicine & Rehabilitation | Admitting: Registered Nurse

## 2017-04-05 DIAGNOSIS — Z5181 Encounter for therapeutic drug level monitoring: Secondary | ICD-10-CM | POA: Insufficient documentation

## 2017-04-05 DIAGNOSIS — Z79899 Other long term (current) drug therapy: Secondary | ICD-10-CM | POA: Insufficient documentation

## 2017-04-05 DIAGNOSIS — I63319 Cerebral infarction due to thrombosis of unspecified middle cerebral artery: Secondary | ICD-10-CM | POA: Insufficient documentation

## 2017-04-05 DIAGNOSIS — G8110 Spastic hemiplegia affecting unspecified side: Secondary | ICD-10-CM | POA: Insufficient documentation

## 2017-04-05 DIAGNOSIS — E1342 Other specified diabetes mellitus with diabetic polyneuropathy: Secondary | ICD-10-CM | POA: Insufficient documentation

## 2017-04-05 DIAGNOSIS — G8114 Spastic hemiplegia affecting left nondominant side: Secondary | ICD-10-CM

## 2017-04-05 DIAGNOSIS — M47817 Spondylosis without myelopathy or radiculopathy, lumbosacral region: Secondary | ICD-10-CM | POA: Insufficient documentation

## 2017-04-05 DIAGNOSIS — G629 Polyneuropathy, unspecified: Secondary | ICD-10-CM | POA: Insufficient documentation

## 2017-04-05 DIAGNOSIS — I5042 Chronic combined systolic (congestive) and diastolic (congestive) heart failure: Secondary | ICD-10-CM | POA: Insufficient documentation

## 2017-04-05 MED ORDER — OXYCODONE-ACETAMINOPHEN 10-325 MG PO TABS: 1.0000 | ORAL_TABLET | Freq: Three times a day (TID) | ORAL | 0 refills | Status: DC | PRN

## 2017-04-05 NOTE — Telephone Encounter (Signed)
[name] arrived late for her appointment. She will not be seen and we will give her her prescriptions this time only.

## 2017-04-06 ENCOUNTER — Other Ambulatory Visit (HOSPITAL_COMMUNITY): Payer: Self-pay

## 2017-04-06 NOTE — Progress Notes (Signed)
Paramedicine Encounter    Patient ID: [name], female    DOB: 10/02/1966, 51 y.o.   MRN: [id number]    Patient Care Team: Minus Liberty, MD as PCP - General  Patient Active Problem List   Diagnosis Date Noted  . Perimenopausal 03/28/2017  . Chronic use of opiate for therapeutic purpose 12/14/2016  . Lumbago 11/22/2016  . Vaginal odor 09/10/2016  . Papular eruption 07/14/2016  . Type 2 diabetes mellitus, uncontrolled, with neuropathy ([place]) 02/04/2016  . Left spastic hemiparesis ([place]) 09/23/2015  . Constipation 09/09/2015  . Left shoulder pain 06/30/2015  . Localization-related symptomatic epilepsy and epileptic syndromes with complex partial seizures, not intractable, without status epilepticus ([place]) 06/13/2015  . Hemiparesis affecting left side as late effect of cerebrovascular accident ([place]) 06/13/2015  . Morbid obesity ([place]) 06/13/2015  . Ulcer of left foot ([place]) 06/11/2015  . Candidal intertrigo 11/05/2014  . History of seizure 10/14/2014  . Vitamin D deficiency 10/01/2014  . Tachycardia 07/04/2014  . Generalized weakness 07/04/2014  . Perimenopausal menorrhagia 12/20/2013  . Stroke ([place]) 12/13/2013  . Biventricular implantable cardiac defibrillator -Medtronic 11/23/2013  . Mixed stress and urge urinary incontinence 10/11/2013  . Health care maintenance 09/13/2013  . Chronic combined systolic and diastolic heart failure ([place]) 08/01/2013  . Nonischemic cardiomyopathy ([place]) 08/01/2013  . LBBB (left bundle branch block) 07/19/2013  . Depression 04/09/2013  . Primary osteoarthritis of left knee 04/14/2012  . Lumbar spondylosis 04/14/2012  . Diabetic peripheral neuropathy ([place]) 04/14/2012  . Hyperlipidemia 11/23/2011  . OSA (obstructive sleep apnea) 08/27/2011  . Benign essential HTN 08/27/2010    Current Outpatient Prescriptions:  .  ACCU-CHEK FASTCLIX LANCETS MISC, Check blood sugar 3 times a day, Disp: 102 each, Rfl: 12 .  ACCU-CHEK SMARTVIEW test strip,  USE TO CHECK BLOOD SUGAR THREE TIMES DAILY, Disp: 100 each, Rfl: 12 .  amLODipine (NORVASC) 10 MG tablet, TAKE 1 TABLET BY MOUTH EVERY DAY, Disp: 90 tablet, Rfl: 3 .  atorvastatin (LIPITOR) 40 MG tablet, TAKE 1 TABLET BY MOUTH EVERY DAY, Disp: 30 tablet, Rfl: 3 .  Blood Glucose Monitoring Suppl (ACCU-CHEK GUIDE) w/Device KIT, 1 each by Does not apply route 3 (three) times daily., Disp: 1 kit, Rfl: 1 .  cyclobenzaprine (FLEXERIL) 10 MG tablet, TAKE ONE TABLET BY MOUTH 3 TIMES A DAY AS NEEDED FOR MUSCLE SPASMS, Disp: 90 tablet, Rfl: 0 .  DIGOX 125 MCG tablet, TAKE ONE TABLET BY MOUTH EVERY DAY, Disp: 30 tablet, Rfl: 11 .  docusate sodium (COLACE) 50 MG capsule, Take 1 capsule (50 mg total) by mouth 2 (two) times daily as needed for mild constipation., Disp: 60 capsule, Rfl: 2 .  furosemide (LASIX) 40 MG tablet, Take 1 tablet (40 mg total) by mouth 2 (two) times a week. Every Monday and Friday, Disp: 10 tablet, Rfl: 2 .  gabapentin (NEURONTIN) 300 MG capsule, Take 1 capsule (300 mg total) by mouth 2 (two) times daily., Disp: 60 capsule, Rfl: 2 .  hydrALAZINE (APRESOLINE) 50 MG tablet, TAKE 1 & 1/2 TABLETS BY MOUTH 3 TIMES A DAY, Disp: 405 tablet, Rfl: 3 .  Insulin Glargine (LANTUS SOLOSTAR) 100 UNIT/ML Solostar Pen, Inject 50 Units into the skin daily at 10 pm., Disp: 15 mL, Rfl: 6 .  isosorbide mononitrate (IMDUR) 60 MG 24 hr tablet, TAKE 1 & 1/2 TABLETS BY MOUTH EVERY DAY, Disp: 45 tablet, Rfl: 2 .  levETIRAcetam (KEPPRA) 500 MG tablet, TAKE 1 TABLET BY MOUTH TWO TIMES DAILY **MUST NOT MISS ANY  DOSE**, Disp: 180 tablet, Rfl: 1 .  metoprolol succinate (TOPROL-XL) 100 MG 24 hr tablet, TAKE 1 TABLET BY MOUTH TWO TIMES DAILY, Disp: 60 tablet, Rfl: 2 .  pantoprazole (PROTONIX) 40 MG tablet, Take 1 tablet (40 mg total) by mouth daily., Disp: 90 tablet, Rfl: 1 .  rivaroxaban (XARELTO) 20 MG TABS tablet, Take 1 tablet (20 mg total) by mouth daily with supper., Disp: 30 tablet, Rfl: 3 .  solifenacin (VESICARE)  10 MG tablet, Take 10 mg by mouth daily., Disp: , Rfl:  .  spironolactone (ALDACTONE) 50 MG tablet, Take 1 tablet (50 mg total) by mouth daily., Disp: 90 tablet, Rfl: 1 .  calamine lotion, Apply 1 application topically as needed for itching., Disp: 120 mL, Rfl: 0 .  citalopram (CELEXA) 20 MG tablet, TAKE 1 TABLET BY MOUTH EVERY DAY, Disp: 90 tablet, Rfl: 2 .  diclofenac sodium (VOLTAREN) 1 % GEL, APPLY 2 GM TOPICALLY FOUR TIMES DAILY AS NEEDED FOR PAIN, Disp: 300 g, Rfl: 0 .  fluticasone (CUTIVATE) 0.05 % cream, Apply topically 2 (two) times daily. Dispense two tubes, Disp: 30 g, Rfl: 6 .  hydrocortisone cream 1 %, Apply to itchy skin 2 times daily, Disp: 30 g, Rfl: 1 .  liver oil-zinc oxide (DESITIN) 40 % ointment, Apply 1 application topically as needed for irritation. To skin folds, Disp: 56.7 g, Rfl: 3 .  metFORMIN (GLUCOPHAGE XR) 500 MG 24 hr tablet, Take 4 tablets (2,000 mg total) by mouth daily., Disp: 120 tablet, Rfl: 11 .  mupirocin cream (BACTROBAN) 2 %, Apply 1 application topically 2 (two) times daily., Disp: 15 g, Rfl: 2 .  nystatin (NYSTATIN) powder, Apply to affected area twice daily until healed., Disp: 30 g, Rfl: 2 .  nystatin cream (MYCOSTATIN), APPLY UNDER ABDOMINAL SKIN AND ON BUTTOCKS 2 TIMES A DAY UNTIL RASH RESOLVES, Disp: 30 g, Rfl: 3 .  ondansetron (ZOFRAN ODT) 8 MG disintegrating tablet, Take 1 tablet (8 mg total) by mouth every 8 (eight) hours as needed for nausea or vomiting., Disp: 10 tablet, Rfl: 0 .  oxyCODONE-acetaminophen (PERCOCET) 10-325 MG tablet, Take 1 tablet by mouth every 8 (eight) hours as needed for pain., Disp: 90 tablet, Rfl: 0 .  polyethylene glycol (MIRALAX / GLYCOLAX) packet, MIX 1 PACKET IN 8OZ OF LIQUID EVERY DAY, Disp: 14 each, Rfl: 0 .  triamcinolone (KENALOG) 0.025 % ointment, APPLY TOPICALLY TO LEFT ARM 2 TIMES A DAY UNTIL RASH AND ITCHING RESOLVES, Disp: 30 g, Rfl: 0 .  UNIFINE PENTIPS 31G X 5 MM MISC, USE TO INJECT INSULIN TWO TIMES DAILY, Disp:  100 each, Rfl: 0 .  Vitamin D, Ergocalciferol, (DRISDOL) 50000 units CAPS capsule, TAKE ONE CAPSULE BY MOUTH EVERY MONDAY, Disp: 4 capsule, Rfl: 0 Allergies  Allergen Reactions  . Ace Inhibitors Anaphylaxis    Swelling of the tongue and throat   . Clindamycin Anaphylaxis and Swelling  . Enalapril Maleate Anaphylaxis    Swelling of the tongue and throat   . Lisinopril Anaphylaxis    Swelling of the tongue and throat      Social History   Social History  . Marital status: Single    Spouse name: N/A  . Number of children: 2  . Years of education: 9th grade   Occupational History  . Unemployed     planning on getting disability   Social History Main Topics  . Smoking status: Never Smoker  . Smokeless tobacco: Never Used  . Alcohol use No  .  Drug use: No  . Sexual activity: Not on file   Other Topics Concern  . Not on file   Social History Narrative   Lives in [place] with her son. Is able to read and write fluently in [place].    Physical Exam  Eyes: Pupils are equal, round, and reactive to light.  Pulmonary/Chest: No respiratory distress. She has no wheezes. She has no rales.  Abdominal: There is no tenderness. There is no guarding.  Musculoskeletal: She exhibits no edema.  Skin: Skin is warm and dry. She is not diaphoretic.        Future Appointments Date Time Provider [place]  04/18/2017 2:20 PM Meredith Staggers, MD [facility] CPR  05/02/2017 10:00 AM [facility] CLINIC [facility] None  05/03/2017 11:00 AM Silverio Decamp, MD [facility] None  05/16/2017 10:00 AM [facility] PAT 1 [facility] None  06/10/2017 11:30 AM Deboraha Sprang, MD [facility]   ATF pt sitting on the edge of her bed with no complaints. Pt's new home aide is on scene with her today.  Pt has taken all of her medications without difficulty and her home aide assist her in weighing. Pt denies sob, dizziness, headaches, and chest pain. Pt ate spaghetti for dinner last night.  Pt was  reminded of her low sodium diet and advised how to prepare pasta meals.  rx bottles verified and pill box filled. Pt has enough colace to last until Friday.  Pt stated that she will have her kids get her some this week.   BP (!) 146/94 (BP Location: Right Arm, Patient Position: Sitting, Cuff Size: Large)   Pulse 92   Resp 16   Wt 241 lb (109.3 kg)   LMP 03/28/2017   SpO2 98%   BMI 35.59 kg/m    cbg 151 Weight yesterday-241 Last visit weight-248  Dashea Mcmullan, EMT Paramedic 04/06/2017    ACTION: Home visit completed Next visit planned for next week

## 2017-04-08 ENCOUNTER — Other Ambulatory Visit: Payer: Self-pay | Admitting: Cardiology

## 2017-04-08 ENCOUNTER — Other Ambulatory Visit (HOSPITAL_COMMUNITY): Payer: Self-pay | Admitting: Internal Medicine

## 2017-04-13 ENCOUNTER — Other Ambulatory Visit: Payer: Self-pay | Admitting: Internal Medicine

## 2017-04-13 DIAGNOSIS — B372 Candidiasis of skin and nail: Secondary | ICD-10-CM

## 2017-04-13 NOTE — Telephone Encounter (Signed)
Pt requesting a refill on nystatin cream (MYCOSTATIN) from the Pharmacy on [address].

## 2017-04-15 ENCOUNTER — Other Ambulatory Visit (HOSPITAL_COMMUNITY): Payer: Self-pay

## 2017-04-15 NOTE — Progress Notes (Signed)
Paramedicine Encounter    Patient ID: [name], female    DOB: Mar 25, 1966, 51 y.o.   MRN: [id number]   Patient Care Team: Minus Liberty, MD as PCP - General  Patient Active Problem List   Diagnosis Date Noted  . Perimenopausal 03/28/2017  . Chronic use of opiate for therapeutic purpose 12/14/2016  . Lumbago 11/22/2016  . Vaginal odor 09/10/2016  . Papular eruption 07/14/2016  . Type 2 diabetes mellitus, uncontrolled, with neuropathy ([place]) 02/04/2016  . Left spastic hemiparesis ([place]) 09/23/2015  . Constipation 09/09/2015  . Left shoulder pain 06/30/2015  . Localization-related symptomatic epilepsy and epileptic syndromes with complex partial seizures, not intractable, without status epilepticus ([place]) 06/13/2015  . Hemiparesis affecting left side as late effect of cerebrovascular accident ([place]) 06/13/2015  . Morbid obesity ([place]) 06/13/2015  . Ulcer of left foot ([place]) 06/11/2015  . Candidal intertrigo 11/05/2014  . History of seizure 10/14/2014  . Vitamin D deficiency 10/01/2014  . Tachycardia 07/04/2014  . Generalized weakness 07/04/2014  . Perimenopausal menorrhagia 12/20/2013  . Stroke ([place]) 12/13/2013  . Biventricular implantable cardiac defibrillator -Medtronic 11/23/2013  . Mixed stress and urge urinary incontinence 10/11/2013  . Health care maintenance 09/13/2013  . Chronic combined systolic and diastolic heart failure ([place]) 08/01/2013  . Nonischemic cardiomyopathy ([place]) 08/01/2013  . LBBB (left bundle branch block) 07/19/2013  . Depression 04/09/2013  . Primary osteoarthritis of left knee 04/14/2012  . Lumbar spondylosis 04/14/2012  . Diabetic peripheral neuropathy ([place]) 04/14/2012  . Hyperlipidemia 11/23/2011  . OSA (obstructive sleep apnea) 08/27/2011  . Benign essential HTN 08/27/2010    Current Outpatient Prescriptions:  .  amLODipine (NORVASC) 10 MG tablet, TAKE 1 TABLET BY MOUTH EVERY DAY, Disp: 90 tablet, Rfl: 3 .  atorvastatin (LIPITOR) 40 MG  tablet, TAKE 1 TABLET BY MOUTH EVERY DAY, Disp: 30 tablet, Rfl: 3 .  cyclobenzaprine (FLEXERIL) 10 MG tablet, TAKE ONE TABLET BY MOUTH 3 TIMES A DAY AS NEEDED FOR MUSCLE SPASMS, Disp: 90 tablet, Rfl: 0 .  DIGOX 125 MCG tablet, TAKE ONE TABLET BY MOUTH EVERY DAY, Disp: 30 tablet, Rfl: 11 .  docusate sodium (COLACE) 50 MG capsule, Take 1 capsule (50 mg total) by mouth 2 (two) times daily as needed for mild constipation., Disp: 60 capsule, Rfl: 2 .  furosemide (LASIX) 40 MG tablet, TAKE 1 TABLET BY MOUTH TWICE A WEEK, Disp: 10 tablet, Rfl: 3 .  gabapentin (NEURONTIN) 300 MG capsule, Take 1 capsule (300 mg total) by mouth 2 (two) times daily., Disp: 60 capsule, Rfl: 2 .  hydrALAZINE (APRESOLINE) 50 MG tablet, TAKE 1 & 1/2 TABLETS BY MOUTH 3 TIMES A DAY, Disp: 405 tablet, Rfl: 3 .  isosorbide mononitrate (IMDUR) 60 MG 24 hr tablet, TAKE 1 & 1/2 TABLETS BY MOUTH EVERY DAY, Disp: 45 tablet, Rfl: 2 .  levETIRAcetam (KEPPRA) 500 MG tablet, TAKE 1 TABLET BY MOUTH TWO TIMES DAILY **MUST NOT MISS ANY DOSE**, Disp: 180 tablet, Rfl: 1 .  metFORMIN (GLUCOPHAGE XR) 500 MG 24 hr tablet, Take 4 tablets (2,000 mg total) by mouth daily., Disp: 120 tablet, Rfl: 11 .  metoprolol succinate (TOPROL-XL) 100 MG 24 hr tablet, TAKE 1 TABLET BY MOUTH TWO TIMES DAILY, Disp: 60 tablet, Rfl: 2 .  pantoprazole (PROTONIX) 40 MG tablet, Take 1 tablet (40 mg total) by mouth daily., Disp: 90 tablet, Rfl: 1 .  rivaroxaban (XARELTO) 20 MG TABS tablet, Take 1 tablet (20 mg total) by mouth daily with supper., Disp: 30 tablet, Rfl: 3 .  solifenacin (VESICARE) 10 MG tablet, Take 10 mg by mouth daily., Disp: , Rfl:  .  spironolactone (ALDACTONE) 50 MG tablet, Take 1 tablet (50 mg total) by mouth daily., Disp: 90 tablet, Rfl: 1 .  Vitamin D, Ergocalciferol, (DRISDOL) 50000 units CAPS capsule, TAKE ONE CAPSULE BY MOUTH EVERY MONDAY, Disp: 4 capsule, Rfl: 0 .  ACCU-CHEK FASTCLIX LANCETS MISC, Check blood sugar 3 times a day, Disp: 102 each, Rfl:  12 .  ACCU-CHEK SMARTVIEW test strip, USE TO CHECK BLOOD SUGAR THREE TIMES DAILY, Disp: 100 each, Rfl: 12 .  Blood Glucose Monitoring Suppl (ACCU-CHEK GUIDE) w/Device KIT, 1 each by Does not apply route 3 (three) times daily., Disp: 1 kit, Rfl: 1 .  calamine lotion, Apply 1 application topically as needed for itching., Disp: 120 mL, Rfl: 0 .  citalopram (CELEXA) 20 MG tablet, TAKE 1 TABLET BY MOUTH EVERY DAY, Disp: 90 tablet, Rfl: 2 .  diclofenac sodium (VOLTAREN) 1 % GEL, APPLY 2 GM TOPICALLY FOUR TIMES DAILY AS NEEDED FOR PAIN, Disp: 300 g, Rfl: 0 .  fluticasone (CUTIVATE) 0.05 % cream, Apply topically 2 (two) times daily. Dispense two tubes, Disp: 30 g, Rfl: 6 .  hydrocortisone cream 1 %, Apply to itchy skin 2 times daily, Disp: 30 g, Rfl: 1 .  Insulin Glargine (LANTUS SOLOSTAR) 100 UNIT/ML Solostar Pen, Inject 50 Units into the skin daily at 10 pm., Disp: 15 mL, Rfl: 6 .  liver oil-zinc oxide (DESITIN) 40 % ointment, Apply 1 application topically as needed for irritation. To skin folds, Disp: 56.7 g, Rfl: 3 .  metoprolol (LOPRESSOR) 100 MG tablet, TAKE ONE TABLET BY MOUTH TWICE DAILY, Disp: 60 tablet, Rfl: 2 .  mupirocin cream (BACTROBAN) 2 %, Apply 1 application topically 2 (two) times daily., Disp: 15 g, Rfl: 2 .  nystatin (NYSTATIN) powder, Apply to affected area twice daily until healed., Disp: 30 g, Rfl: 2 .  nystatin cream (MYCOSTATIN), APPLY UNDER ABDOMINAL SKIN AND ON BUTTOCKS 2 TIMES A DAY UNTIL RASH RESOLVES, Disp: 30 g, Rfl: 3 .  ondansetron (ZOFRAN ODT) 8 MG disintegrating tablet, Take 1 tablet (8 mg total) by mouth every 8 (eight) hours as needed for nausea or vomiting., Disp: 10 tablet, Rfl: 0 .  oxyCODONE-acetaminophen (PERCOCET) 10-325 MG tablet, Take 1 tablet by mouth every 8 (eight) hours as needed for pain., Disp: 90 tablet, Rfl: 0 .  polyethylene glycol (MIRALAX / GLYCOLAX) packet, MIX 1 PACKET IN 8OZ OF LIQUID EVERY DAY, Disp: 14 each, Rfl: 0 .  triamcinolone (KENALOG) 0.025  % ointment, APPLY TOPICALLY TO LEFT ARM 2 TIMES A DAY UNTIL RASH AND ITCHING RESOLVES, Disp: 30 g, Rfl: 0 .  UNIFINE PENTIPS 31G X 5 MM MISC, USE TO INJECT INSULIN TWO TIMES DAILY, Disp: 100 each, Rfl: 0 Allergies  Allergen Reactions  . Ace Inhibitors Anaphylaxis    Swelling of the tongue and throat   . Clindamycin Anaphylaxis and Swelling  . Enalapril Maleate Anaphylaxis    Swelling of the tongue and throat   . Lisinopril Anaphylaxis    Swelling of the tongue and throat      Social History   Social History  . Marital status: Single    Spouse name: N/A  . Number of children: 2  . Years of education: 9th grade   Occupational History  . Unemployed     planning on getting disability   Social History Main Topics  . Smoking status: Never Smoker  . Smokeless tobacco: Never Used  .  Alcohol use No  . Drug use: No  . Sexual activity: Not on file   Other Topics Concern  . Not on file   Social History Narrative   Lives in [place] with her son. Is able to read and write fluently in [place].    Physical Exam  Eyes: Pupils are equal, round, and reactive to light.  Pulmonary/Chest: No respiratory distress. She has no wheezes. She has no rales.  Abdominal: She exhibits no distension. There is no rebound and no guarding.  Musculoskeletal: She exhibits no edema.  Skin: Skin is warm and dry. She is not diaphoretic.        Future Appointments Date Time Provider [place]  04/18/2017 2:20 PM Meredith Staggers, MD CPR-PR[facility] CPR  04/27/2017 12:10 PM GI-[facility]G MM 3 GI-BCGMM GI-BREAST CE  05/02/2017 10:00 AM [facility] CLINIC MC-[facility] None  05/03/2017 11:00 AM Silverio Decamp, MD PCK-PCK None  05/16/2017 10:00 AM [facility] PAT 1 MC-SDSC None  06/10/2017 11:30 AM Deboraha Sprang, MD [facility]    ATF pt CAO x4 coming in from [place].  Pt stated that she has been feeling nauseated all week and she hadn't been taking her medications. Pt has only taken her morning meds  and missed all afternoon and evening. Pt stated that she has not had a bowel movement in over a week and that makes her sick to the stomach.  Pt also disclosed family issues that she appears may have had an impact on her appetite.  Pt's home aide has also changed which she stated that has gotten her off of her schedule. Pt stated that she will start back taking her meds today.  I advised pt that bouncing back and forth with her meds is not good for her heart.  rx bottles verified and pill box re-filled.   BP (!) 118/98 (BP Location: Right Arm, Patient Position: Sitting, Cuff Size: Large)   Pulse 95   Resp 16   Wt 250 lb (113.4 kg)   LMP 03/28/2017   SpO2 98%   BMI 36.92 kg/m   Weight yesterday-weighed but did not write down and cant remember Last visit weight- 241 (04/05/17)   Muskaan Smet, EMT Paramedic 04/15/2017    ACTION: Home visit completed Next visit planned for next friday

## 2017-04-18 ENCOUNTER — Encounter: Payer: Self-pay | Admitting: Physical Medicine & Rehabilitation

## 2017-04-18 ENCOUNTER — Telehealth: Payer: Self-pay | Admitting: *Deleted

## 2017-04-18 ENCOUNTER — Encounter: Payer: Medicaid Other | Attending: Physical Medicine & Rehabilitation | Admitting: Physical Medicine & Rehabilitation

## 2017-04-18 VITALS — BP 130/84 | HR 94 | Resp 14

## 2017-04-18 DIAGNOSIS — Z79899 Other long term (current) drug therapy: Secondary | ICD-10-CM | POA: Insufficient documentation

## 2017-04-18 DIAGNOSIS — Z5181 Encounter for therapeutic drug level monitoring: Secondary | ICD-10-CM

## 2017-04-18 DIAGNOSIS — E1342 Other specified diabetes mellitus with diabetic polyneuropathy: Secondary | ICD-10-CM | POA: Insufficient documentation

## 2017-04-18 DIAGNOSIS — I63319 Cerebral infarction due to thrombosis of unspecified middle cerebral artery: Secondary | ICD-10-CM | POA: Diagnosis present

## 2017-04-18 DIAGNOSIS — G8110 Spastic hemiplegia affecting unspecified side: Secondary | ICD-10-CM | POA: Insufficient documentation

## 2017-04-18 DIAGNOSIS — M47816 Spondylosis without myelopathy or radiculopathy, lumbar region: Secondary | ICD-10-CM | POA: Diagnosis not present

## 2017-04-18 DIAGNOSIS — M1712 Unilateral primary osteoarthritis, left knee: Secondary | ICD-10-CM

## 2017-04-18 DIAGNOSIS — M47817 Spondylosis without myelopathy or radiculopathy, lumbosacral region: Secondary | ICD-10-CM | POA: Insufficient documentation

## 2017-04-18 DIAGNOSIS — G8114 Spastic hemiplegia affecting left nondominant side: Secondary | ICD-10-CM | POA: Diagnosis not present

## 2017-04-18 DIAGNOSIS — I5042 Chronic combined systolic (congestive) and diastolic (congestive) heart failure: Secondary | ICD-10-CM | POA: Diagnosis present

## 2017-04-18 DIAGNOSIS — G629 Polyneuropathy, unspecified: Secondary | ICD-10-CM | POA: Diagnosis present

## 2017-04-18 MED ORDER — NYSTATIN 100000 UNIT/GM EX POWD: CUTANEOUS | 2 refills | Status: DC

## 2017-04-18 MED ORDER — NYSTATIN 100000 UNIT/GM EX CREA: TOPICAL_CREAM | CUTANEOUS | 2 refills | Status: DC

## 2017-04-18 MED ORDER — OXYCODONE-ACETAMINOPHEN 7.5-325 MG PO TABS: 1.0000 | ORAL_TABLET | Freq: Three times a day (TID) | ORAL | 0 refills | Status: DC | PRN

## 2017-04-18 NOTE — Telephone Encounter (Signed)
[name] had Metformin 1000 mg imprint A on front and 14 on back of white oval pills in her oxycodone bottle.  # 56 tablets but bottle 98% full and would not have held #90 of this sized pills.    [name] denied knowing about the medication.  I had her call home and family member identified her Metformin 500 mg tablets with imprint 7L001 on white oval pills in that bottle.  I called and spoke with pharmacist Vishal.  He says they do not carry the manufacture of the Metformin found in the bottle and denies there has been a discrepancy in the oxycodone 7.5/325 count.  Dr Riley Kill made aware.  UDS collected today

## 2017-04-18 NOTE — Progress Notes (Signed)
Botox Injection for spasticity using needle EMG guidance Indication: Left spastic hemiparesis (HCC) - Plan: ToxAssure Select Plus  Lumbar spondylosis - Plan: ToxAssure Select Plus  Primary osteoarthritis of left knee - Plan: ToxAssure Select Plus  Encounter for therapeutic drug monitoring - Plan: ToxAssure Select Plus, CANCELED: ToxAssure Select Plus, CANCELED: ToxASSURE Select 13 (MW), Urine   Dilution: 100 Units/ml        Total Units Injected: 400 units Indication: Severe spasticity which interferes with ADL,mobility and/or  hygiene and is unresponsive to medication management and other conservative care Informed consent was obtained after describing risks and benefits of the procedure with the patient. This includes bleeding, bruising, infection, excessive weakness, or medication side effects. A REMS form is on file and signed.  Needle: 50mm injectable monopolar needle electrode  Number of units per muscle Pectoralis Major 0 units Pectoralis Minor 0 units Biceps 150 units Brachioradialis 50 units FCR 25 units FCU 25 units FDS 75 units FDP 75 units FPL 0 units Pronator Teres 0 units Pronator Quadratus 0 units  All injections were done after obtaining appropriate EMG activity and after negative drawback for blood. The patient tolerated the procedure well. Post procedure instructions were given. Return in about 1 month (around 05/19/2017) for NP Visit.

## 2017-04-18 NOTE — Patient Instructions (Signed)
PLEASE FEEL FREE TO CALL OUR OFFICE WITH ANY PROBLEMS OR QUESTIONS ([phone])      

## 2017-04-20 ENCOUNTER — Telehealth: Payer: Self-pay | Admitting: Dietician

## 2017-04-20 DIAGNOSIS — IMO0002 Reserved for concepts with insufficient information to code with codable children: Secondary | ICD-10-CM

## 2017-04-20 DIAGNOSIS — E1165 Type 2 diabetes mellitus with hyperglycemia: Principal | ICD-10-CM

## 2017-04-20 DIAGNOSIS — E1140 Type 2 diabetes mellitus with diabetic neuropathy, unspecified: Secondary | ICD-10-CM

## 2017-04-20 NOTE — Telephone Encounter (Signed)
Called [name] per Dr. Val Eagle' Sullivan's requiest.  She reports taking  lantus- 50 units takes in the morning and Metformin- 2000 mg daily and not missing any doses. She reports her morning blood sugar have better than they were, mostly I nteh 140=160 range.  Instructed her to Increase   Her lantus to 52 units daily. She verbalized understanding. I told her I would check back in two weeks to see how this new dose affects her morning blood sugars  after discussing readings and insulin with Dr. Val EagleLendell Caprice.

## 2017-04-21 ENCOUNTER — Encounter: Payer: Self-pay | Admitting: Gastroenterology

## 2017-04-21 ENCOUNTER — Telehealth: Payer: Self-pay | Admitting: *Deleted

## 2017-04-21 LAB — TOXASSURE SELECT,+ANTIDEPR,UR

## 2017-04-21 MED ORDER — INSULIN GLARGINE 100 UNIT/ML SOLOSTAR PEN: 52.0000 [IU] | PEN_INJECTOR | Freq: Every day | SUBCUTANEOUS | 6 refills | Status: DC

## 2017-04-21 NOTE — Telephone Encounter (Signed)
Urine drug screen for this encounter is inconsistent for prescribed medication. There were no medications or metabolites in her urine. There was a problem with her medication bottle being filled with metformin and not percocet.  Dr Riley Kill aware (see previous telephone message) Undetermined if this was [name], someone in the home, or the pharmacy.  If she was taking 3000 mg of metformin a day instead of the percocet plus the daily scheduled dose of metformin and lantus, it is uncertain how she was not experiencing hypoglycemic episodes.

## 2017-04-22 ENCOUNTER — Other Ambulatory Visit (HOSPITAL_COMMUNITY): Payer: Self-pay

## 2017-04-22 NOTE — Progress Notes (Signed)
Paramedicine Encounter    Patient ID: [name], female    DOB: 1966/09/20, 51 y.o.   MRN: [id number]    Patient Care Team: Minus Liberty, MD as PCP - General  Patient Active Problem List   Diagnosis Date Noted  . Perimenopausal 03/28/2017  . Chronic use of opiate for therapeutic purpose 12/14/2016  . Lumbago 11/22/2016  . Vaginal odor 09/10/2016  . Papular eruption 07/14/2016  . Type 2 diabetes mellitus, uncontrolled, with neuropathy ([place]) 02/04/2016  . Left spastic hemiparesis ([place]) 09/23/2015  . Constipation 09/09/2015  . Left shoulder pain 06/30/2015  . Localization-related symptomatic epilepsy and epileptic syndromes with complex partial seizures, not intractable, without status epilepticus ([place]) 06/13/2015  . Hemiparesis affecting left side as late effect of cerebrovascular accident ([place]) 06/13/2015  . Morbid obesity ([place]) 06/13/2015  . Ulcer of left foot ([place]) 06/11/2015  . Candidal intertrigo 11/05/2014  . History of seizure 10/14/2014  . Vitamin D deficiency 10/01/2014  . Tachycardia 07/04/2014  . Generalized weakness 07/04/2014  . Perimenopausal menorrhagia 12/20/2013  . Stroke ([place]) 12/13/2013  . Biventricular implantable cardiac defibrillator -Medtronic 11/23/2013  . Mixed stress and urge urinary incontinence 10/11/2013  . Health care maintenance 09/13/2013  . Chronic combined systolic and diastolic heart failure ([place]) 08/01/2013  . Nonischemic cardiomyopathy ([place]) 08/01/2013  . LBBB (left bundle branch block) 07/19/2013  . Depression 04/09/2013  . Primary osteoarthritis of left knee 04/14/2012  . Lumbar spondylosis 04/14/2012  . Diabetic peripheral neuropathy ([place]) 04/14/2012  . Hyperlipidemia 11/23/2011  . OSA (obstructive sleep apnea) 08/27/2011  . Benign essential HTN 08/27/2010    Current Outpatient Prescriptions:  .  ACCU-CHEK FASTCLIX LANCETS MISC, Check blood sugar 3 times a day, Disp: 102 each, Rfl: 12 .  ACCU-CHEK SMARTVIEW test strip,  USE TO CHECK BLOOD SUGAR THREE TIMES DAILY, Disp: 100 each, Rfl: 12 .  amLODipine (NORVASC) 10 MG tablet, TAKE 1 TABLET BY MOUTH EVERY DAY, Disp: 90 tablet, Rfl: 3 .  atorvastatin (LIPITOR) 40 MG tablet, TAKE 1 TABLET BY MOUTH EVERY DAY, Disp: 30 tablet, Rfl: 3 .  Blood Glucose Monitoring Suppl (ACCU-CHEK GUIDE) w/Device KIT, 1 each by Does not apply route 3 (three) times daily., Disp: 1 kit, Rfl: 1 .  calamine lotion, Apply 1 application topically as needed for itching., Disp: 120 mL, Rfl: 0 .  citalopram (CELEXA) 20 MG tablet, TAKE 1 TABLET BY MOUTH EVERY DAY, Disp: 90 tablet, Rfl: 2 .  cyclobenzaprine (FLEXERIL) 10 MG tablet, TAKE ONE TABLET BY MOUTH 3 TIMES A DAY AS NEEDED FOR MUSCLE SPASMS, Disp: 90 tablet, Rfl: 0 .  diclofenac sodium (VOLTAREN) 1 % GEL, APPLY 2 GM TOPICALLY FOUR TIMES DAILY AS NEEDED FOR PAIN, Disp: 300 g, Rfl: 0 .  DIGOX 125 MCG tablet, TAKE ONE TABLET BY MOUTH EVERY DAY, Disp: 30 tablet, Rfl: 11 .  docusate sodium (COLACE) 50 MG capsule, Take 1 capsule (50 mg total) by mouth 2 (two) times daily as needed for mild constipation., Disp: 60 capsule, Rfl: 2 .  fluticasone (CUTIVATE) 0.05 % cream, Apply topically 2 (two) times daily. Dispense two tubes, Disp: 30 g, Rfl: 6 .  furosemide (LASIX) 40 MG tablet, TAKE 1 TABLET BY MOUTH TWICE A WEEK, Disp: 10 tablet, Rfl: 3 .  gabapentin (NEURONTIN) 300 MG capsule, Take 1 capsule (300 mg total) by mouth 2 (two) times daily., Disp: 60 capsule, Rfl: 2 .  hydrALAZINE (APRESOLINE) 50 MG tablet, TAKE 1 & 1/2 TABLETS BY MOUTH 3 TIMES A DAY, Disp:  405 tablet, Rfl: 3 .  hydrocortisone cream 1 %, Apply to itchy skin 2 times daily, Disp: 30 g, Rfl: 1 .  Insulin Glargine (LANTUS SOLOSTAR) 100 UNIT/ML Solostar Pen, Inject 52 Units into the skin daily at 10 pm., Disp: 30 mL, Rfl: 6 .  isosorbide mononitrate (IMDUR) 60 MG 24 hr tablet, TAKE 1 & 1/2 TABLETS BY MOUTH EVERY DAY, Disp: 45 tablet, Rfl: 2 .  levETIRAcetam (KEPPRA) 500 MG tablet, TAKE 1  TABLET BY MOUTH TWO TIMES DAILY **MUST NOT MISS ANY DOSE**, Disp: 180 tablet, Rfl: 1 .  liver oil-zinc oxide (DESITIN) 40 % ointment, Apply 1 application topically as needed for irritation. To skin folds, Disp: 56.7 g, Rfl: 3 .  metFORMIN (GLUCOPHAGE XR) 500 MG 24 hr tablet, Take 4 tablets (2,000 mg total) by mouth daily., Disp: 120 tablet, Rfl: 11 .  metoprolol (LOPRESSOR) 100 MG tablet, TAKE ONE TABLET BY MOUTH TWICE DAILY, Disp: 60 tablet, Rfl: 2 .  metoprolol succinate (TOPROL-XL) 100 MG 24 hr tablet, TAKE 1 TABLET BY MOUTH TWO TIMES DAILY, Disp: 60 tablet, Rfl: 2 .  mupirocin cream (BACTROBAN) 2 %, Apply 1 application topically 2 (two) times daily., Disp: 15 g, Rfl: 2 .  nystatin (NYSTATIN) powder, Apply to affected area twice daily until healed., Disp: 30 g, Rfl: 2 .  nystatin cream (MYCOSTATIN), APPLY UNDER ABDOMINAL SKIN AND ON BUTTOCKS 2 TIMES A DAY UNTIL RASH RESOLVES, Disp: 30 g, Rfl: 2 .  ondansetron (ZOFRAN ODT) 8 MG disintegrating tablet, Take 1 tablet (8 mg total) by mouth every 8 (eight) hours as needed for nausea or vomiting., Disp: 10 tablet, Rfl: 0 .  oxyCODONE-acetaminophen (PERCOCET) 7.5-325 MG tablet, Take 1 tablet by mouth every 8 (eight) hours as needed., Disp: 90 tablet, Rfl: 0 .  pantoprazole (PROTONIX) 40 MG tablet, Take 1 tablet (40 mg total) by mouth daily., Disp: 90 tablet, Rfl: 1 .  polyethylene glycol (MIRALAX / GLYCOLAX) packet, MIX 1 PACKET IN 8OZ OF LIQUID EVERY DAY, Disp: 14 each, Rfl: 0 .  rivaroxaban (XARELTO) 20 MG TABS tablet, Take 1 tablet (20 mg total) by mouth daily with supper., Disp: 30 tablet, Rfl: 3 .  solifenacin (VESICARE) 10 MG tablet, Take 10 mg by mouth daily., Disp: , Rfl:  .  spironolactone (ALDACTONE) 50 MG tablet, Take 1 tablet (50 mg total) by mouth daily., Disp: 90 tablet, Rfl: 1 .  triamcinolone (KENALOG) 0.025 % ointment, APPLY TOPICALLY TO LEFT ARM 2 TIMES A DAY UNTIL RASH AND ITCHING RESOLVES, Disp: 30 g, Rfl: 0 .  UNIFINE PENTIPS 31G X 5  MM MISC, USE TO INJECT INSULIN TWO TIMES DAILY, Disp: 100 each, Rfl: 0 .  Vitamin D, Ergocalciferol, (DRISDOL) 50000 units CAPS capsule, TAKE ONE CAPSULE BY MOUTH EVERY MONDAY, Disp: 4 capsule, Rfl: 0 Allergies  Allergen Reactions  . Ace Inhibitors Anaphylaxis    Swelling of the tongue and throat   . Clindamycin Anaphylaxis and Swelling  . Enalapril Maleate Anaphylaxis    Swelling of the tongue and throat   . Lisinopril Anaphylaxis    Swelling of the tongue and throat      Social History   Social History  . Marital status: Single    Spouse name: N/A  . Number of children: 2  . Years of education: 9th grade   Occupational History  . Unemployed     planning on getting disability   Social History Main Topics  . Smoking status: Never Smoker  . Smokeless tobacco: Never  Used  . Alcohol use No  . Drug use: No  . Sexual activity: Not on file   Other Topics Concern  . Not on file   Social History Narrative   Lives in [place] with her son. Is able to read and write fluently in [place].    Physical Exam  Eyes: Pupils are equal, round, and reactive to light.  Pulmonary/Chest: No respiratory distress. She has no wheezes. She has no rales.  Abdominal: She exhibits no distension. There is no tenderness. There is no guarding.  Musculoskeletal: She exhibits no edema.  Skin: Skin is warm and dry. She is not diaphoretic.        Future Appointments Date Time Provider [place]  04/27/2017 12:10 PM GI-[facility]G MM 3 GI-BCGMM [facility] CE  05/02/2017 10:00 AM [facility] CLINIC [facility] None  05/03/2017 11:00 AM Silverio Decamp, MD PCK-PCK None  05/16/2017 10:00 AM [facility] PAT 1 [facility] None  05/19/2017 10:00 AM Bayard Hugger, NP CPR-PR[facility] CPR  06/10/2017 10:00 AM [facility] PREVISIT RM 51 [facility]  06/10/2017 11:30 AM Deboraha Sprang, MD [facility]  06/24/2017 8:00 AM Armbruster, Renelda [place], MD [facility]    ATF pt CAO x4 with no complaints  today. Pt stated that she had gone to see her primary physician this week and it was discovered that her pain pills was replaced with metformin. Pt stated that she was very upset and embarrassed. Pt doesn't know who would've taken her pain medications.  Pt is also worried about her electricity being disconnected.  Pt has taken all of her medications this week and has been weighing daily.  She is still having trouble with her home aide coming consistently.  Pt denies sob, dizziness, headache and chest pain.  Pt has no other issues to report.  rx bottles verified and pill box refilled.    LMP 03/28/2017    cbg 160 Weight yesterday-250 Last visit weight-250    Johnathan Tortorelli, EMT Paramedic 04/22/2017    ACTION: Home visit completed

## 2017-04-22 NOTE — Telephone Encounter (Signed)
As I explained to [name], it is her responsibility to ensure that her medications are safely stored. A formal written warning should be issued to the patient.

## 2017-04-25 NOTE — Telephone Encounter (Signed)
Formal warning letter sent. 

## 2017-04-27 ENCOUNTER — Ambulatory Visit: Payer: Medicaid Other

## 2017-04-29 ENCOUNTER — Other Ambulatory Visit (HOSPITAL_COMMUNITY): Payer: Self-pay

## 2017-04-29 NOTE — Progress Notes (Signed)
Paramedicine Encounter    Patient ID: [name], female    DOB: 1966/09/20, 51 y.o.   MRN: [id number]    Patient Care Team: Minus Liberty, MD as PCP - General  Patient Active Problem List   Diagnosis Date Noted  . Perimenopausal 03/28/2017  . Chronic use of opiate for therapeutic purpose 12/14/2016  . Lumbago 11/22/2016  . Vaginal odor 09/10/2016  . Papular eruption 07/14/2016  . Type 2 diabetes mellitus, uncontrolled, with neuropathy ([place]) 02/04/2016  . Left spastic hemiparesis ([place]) 09/23/2015  . Constipation 09/09/2015  . Left shoulder pain 06/30/2015  . Localization-related symptomatic epilepsy and epileptic syndromes with complex partial seizures, not intractable, without status epilepticus ([place]) 06/13/2015  . Hemiparesis affecting left side as late effect of cerebrovascular accident ([place]) 06/13/2015  . Morbid obesity ([place]) 06/13/2015  . Ulcer of left foot ([place]) 06/11/2015  . Candidal intertrigo 11/05/2014  . History of seizure 10/14/2014  . Vitamin D deficiency 10/01/2014  . Tachycardia 07/04/2014  . Generalized weakness 07/04/2014  . Perimenopausal menorrhagia 12/20/2013  . Stroke ([place]) 12/13/2013  . Biventricular implantable cardiac defibrillator -Medtronic 11/23/2013  . Mixed stress and urge urinary incontinence 10/11/2013  . Health care maintenance 09/13/2013  . Chronic combined systolic and diastolic heart failure ([place]) 08/01/2013  . Nonischemic cardiomyopathy ([place]) 08/01/2013  . LBBB (left bundle branch block) 07/19/2013  . Depression 04/09/2013  . Primary osteoarthritis of left knee 04/14/2012  . Lumbar spondylosis 04/14/2012  . Diabetic peripheral neuropathy ([place]) 04/14/2012  . Hyperlipidemia 11/23/2011  . OSA (obstructive sleep apnea) 08/27/2011  . Benign essential HTN 08/27/2010    Current Outpatient Prescriptions:  .  ACCU-CHEK FASTCLIX LANCETS MISC, Check blood sugar 3 times a day, Disp: 102 each, Rfl: 12 .  ACCU-CHEK SMARTVIEW test strip,  USE TO CHECK BLOOD SUGAR THREE TIMES DAILY, Disp: 100 each, Rfl: 12 .  amLODipine (NORVASC) 10 MG tablet, TAKE 1 TABLET BY MOUTH EVERY DAY, Disp: 90 tablet, Rfl: 3 .  atorvastatin (LIPITOR) 40 MG tablet, TAKE 1 TABLET BY MOUTH EVERY DAY, Disp: 30 tablet, Rfl: 3 .  Blood Glucose Monitoring Suppl (ACCU-CHEK GUIDE) w/Device KIT, 1 each by Does not apply route 3 (three) times daily., Disp: 1 kit, Rfl: 1 .  calamine lotion, Apply 1 application topically as needed for itching., Disp: 120 mL, Rfl: 0 .  citalopram (CELEXA) 20 MG tablet, TAKE 1 TABLET BY MOUTH EVERY DAY, Disp: 90 tablet, Rfl: 2 .  cyclobenzaprine (FLEXERIL) 10 MG tablet, TAKE ONE TABLET BY MOUTH 3 TIMES A DAY AS NEEDED FOR MUSCLE SPASMS, Disp: 90 tablet, Rfl: 0 .  diclofenac sodium (VOLTAREN) 1 % GEL, APPLY 2 GM TOPICALLY FOUR TIMES DAILY AS NEEDED FOR PAIN, Disp: 300 g, Rfl: 0 .  DIGOX 125 MCG tablet, TAKE ONE TABLET BY MOUTH EVERY DAY, Disp: 30 tablet, Rfl: 11 .  docusate sodium (COLACE) 50 MG capsule, Take 1 capsule (50 mg total) by mouth 2 (two) times daily as needed for mild constipation., Disp: 60 capsule, Rfl: 2 .  fluticasone (CUTIVATE) 0.05 % cream, Apply topically 2 (two) times daily. Dispense two tubes, Disp: 30 g, Rfl: 6 .  furosemide (LASIX) 40 MG tablet, TAKE 1 TABLET BY MOUTH TWICE A WEEK, Disp: 10 tablet, Rfl: 3 .  gabapentin (NEURONTIN) 300 MG capsule, Take 1 capsule (300 mg total) by mouth 2 (two) times daily., Disp: 60 capsule, Rfl: 2 .  hydrALAZINE (APRESOLINE) 50 MG tablet, TAKE 1 & 1/2 TABLETS BY MOUTH 3 TIMES A DAY, Disp:  405 tablet, Rfl: 3 .  hydrocortisone cream 1 %, Apply to itchy skin 2 times daily, Disp: 30 g, Rfl: 1 .  Insulin Glargine (LANTUS SOLOSTAR) 100 UNIT/ML Solostar Pen, Inject 52 Units into the skin daily at 10 pm., Disp: 30 mL, Rfl: 6 .  isosorbide mononitrate (IMDUR) 60 MG 24 hr tablet, TAKE 1 & 1/2 TABLETS BY MOUTH EVERY DAY, Disp: 45 tablet, Rfl: 2 .  levETIRAcetam (KEPPRA) 500 MG tablet, TAKE 1  TABLET BY MOUTH TWO TIMES DAILY **MUST NOT MISS ANY DOSE**, Disp: 180 tablet, Rfl: 1 .  liver oil-zinc oxide (DESITIN) 40 % ointment, Apply 1 application topically as needed for irritation. To skin folds, Disp: 56.7 g, Rfl: 3 .  metFORMIN (GLUCOPHAGE XR) 500 MG 24 hr tablet, Take 4 tablets (2,000 mg total) by mouth daily., Disp: 120 tablet, Rfl: 11 .  metoprolol (LOPRESSOR) 100 MG tablet, TAKE ONE TABLET BY MOUTH TWICE DAILY, Disp: 60 tablet, Rfl: 2 .  metoprolol succinate (TOPROL-XL) 100 MG 24 hr tablet, TAKE 1 TABLET BY MOUTH TWO TIMES DAILY, Disp: 60 tablet, Rfl: 2 .  mupirocin cream (BACTROBAN) 2 %, Apply 1 application topically 2 (two) times daily., Disp: 15 g, Rfl: 2 .  nystatin (NYSTATIN) powder, Apply to affected area twice daily until healed., Disp: 30 g, Rfl: 2 .  nystatin cream (MYCOSTATIN), APPLY UNDER ABDOMINAL SKIN AND ON BUTTOCKS 2 TIMES A DAY UNTIL RASH RESOLVES, Disp: 30 g, Rfl: 2 .  ondansetron (ZOFRAN ODT) 8 MG disintegrating tablet, Take 1 tablet (8 mg total) by mouth every 8 (eight) hours as needed for nausea or vomiting., Disp: 10 tablet, Rfl: 0 .  oxyCODONE-acetaminophen (PERCOCET) 7.5-325 MG tablet, Take 1 tablet by mouth every 8 (eight) hours as needed., Disp: 90 tablet, Rfl: 0 .  pantoprazole (PROTONIX) 40 MG tablet, Take 1 tablet (40 mg total) by mouth daily., Disp: 90 tablet, Rfl: 1 .  polyethylene glycol (MIRALAX / GLYCOLAX) packet, MIX 1 PACKET IN 8OZ OF LIQUID EVERY DAY, Disp: 14 each, Rfl: 0 .  rivaroxaban (XARELTO) 20 MG TABS tablet, Take 1 tablet (20 mg total) by mouth daily with supper., Disp: 30 tablet, Rfl: 3 .  solifenacin (VESICARE) 10 MG tablet, Take 10 mg by mouth daily., Disp: , Rfl:  .  spironolactone (ALDACTONE) 50 MG tablet, Take 1 tablet (50 mg total) by mouth daily., Disp: 90 tablet, Rfl: 1 .  triamcinolone (KENALOG) 0.025 % ointment, APPLY TOPICALLY TO LEFT ARM 2 TIMES A DAY UNTIL RASH AND ITCHING RESOLVES, Disp: 30 g, Rfl: 0 .  UNIFINE PENTIPS 31G X 5  MM MISC, USE TO INJECT INSULIN TWO TIMES DAILY, Disp: 100 each, Rfl: 0 .  Vitamin D, Ergocalciferol, (DRISDOL) 50000 units CAPS capsule, TAKE ONE CAPSULE BY MOUTH EVERY MONDAY, Disp: 4 capsule, Rfl: 0 Allergies  Allergen Reactions  . Ace Inhibitors Anaphylaxis    Swelling of the tongue and throat   . Clindamycin Anaphylaxis and Swelling  . Enalapril Maleate Anaphylaxis    Swelling of the tongue and throat   . Lisinopril Anaphylaxis    Swelling of the tongue and throat      Social History   Social History  . Marital status: Single    Spouse name: N/A  . Number of children: 2  . Years of education: 9th grade   Occupational History  . Unemployed     planning on getting disability   Social History Main Topics  . Smoking status: Never Smoker  . Smokeless tobacco: Never  Used  . Alcohol use No  . Drug use: No  . Sexual activity: Not on file   Other Topics Concern  . Not on file   Social History Narrative   Lives in [place] with her son. Is able to read and write fluently in [place].    Physical Exam      Future Appointments Date Time Provider [place]  05/02/2017 10:00 AM [facility] CLINIC [facility] None  05/03/2017 11:00 AM Silverio Decamp, MD PCK-PC[facility] None  05/16/2017 10:00 AM [facility] PAT 1 [facility] None  05/19/2017 10:00 AM Bayard Hugger, NP [facility] CPR  06/10/2017 10:00 AM [facility] PREVISIT RM 51 [facility]  06/10/2017 11:30 AM Deboraha Sprang, MD [facility]  06/24/2017 8:00 AM Armbruster, Renelda Loma, MD [facility]    ATF pt still not home.  I called pt several times and she confirmed the time and stated that she was "on her way".  I advised pt that I will ask another CHP to visit her Monday to refill her pill box. She called me back and stated that she has enough pills to last her until Tuesday.  There were no vitals taken for this visit.    Babetta Paterson, EMT Paramedic 04/29/2017    ACTION: Next visit planned  for next tuesday

## 2017-05-02 ENCOUNTER — Inpatient Hospital Stay (HOSPITAL_COMMUNITY): Admission: RE | Admit: 2017-05-02 | Payer: Medicaid Other | Source: Ambulatory Visit

## 2017-05-03 ENCOUNTER — Ambulatory Visit (INDEPENDENT_AMBULATORY_CARE_PROVIDER_SITE_OTHER): Payer: Medicaid Other | Admitting: Sports Medicine

## 2017-05-03 ENCOUNTER — Other Ambulatory Visit (HOSPITAL_COMMUNITY): Payer: Self-pay

## 2017-05-03 ENCOUNTER — Encounter: Payer: Self-pay | Admitting: Sports Medicine

## 2017-05-03 DIAGNOSIS — M25512 Pain in left shoulder: Secondary | ICD-10-CM

## 2017-05-03 DIAGNOSIS — G8114 Spastic hemiplegia affecting left nondominant side: Secondary | ICD-10-CM

## 2017-05-03 DIAGNOSIS — M47816 Spondylosis without myelopathy or radiculopathy, lumbar region: Secondary | ICD-10-CM | POA: Diagnosis not present

## 2017-05-03 DIAGNOSIS — G8929 Other chronic pain: Secondary | ICD-10-CM | POA: Diagnosis not present

## 2017-05-03 NOTE — Progress Notes (Signed)
Subjective:    CC:  Follow-up  HPI: This is a pleasant 51 year old female with a left-sided CVA with persistent spastic hemiparesis,  We injected her subacromial bursa, acromioclavicular joint and glenohumeral joints last year and she did well for almost 6 months. She's now having a recurrence of pain and desires repeat injections.   Pain is moderate, persistent, localized without radiation.  Right low back pain: Currently working with physiatry, if insufficient relief I have advised that she can discuss her back pain with [place] as well. We will not be doing narcotic pain management but interventional.  Considering her left-sided spastic hemiparesis she does have contractures with her hand.  Past medical history:  Negative.  See flowsheet/record as well for more information.  Surgical history: Negative.  See flowsheet/record as well for more information.  Family history: Negative.  See flowsheet/record as well for more information.  Social history: Negative.  See flowsheet/record as well for more information.  Allergies, and medications have been entered into the medical record, reviewed, and no changes needed.   Review of Systems: No fevers, chills, night sweats, weight loss, chest pain, or shortness of breath.   Objective:    General: Well Developed, well nourished, and in no acute distress.  Neuro: Alert and oriented x3, extra-ocular muscles intact, sensation grossly intact.  HEENT: Normocephalic, atraumatic, pupils equal round reactive to light, neck supple, no masses, no lymphadenopathy, thyroid nonpalpable.  Skin: Warm and dry, no rashes. Cardiac: Regular rate and rhythm, no murmurs rubs or gallops, no lower extremity edema.  Respiratory: Clear to auscultation bilaterally. Not using accessory muscles, speaking in full sentences.  Procedure: Real-time Ultrasound Guided Injection of right acromioclavicular joint Device: GE Logiq E  Verbal informed consent obtained.  Time-out  conducted.  Noted no overlying erythema, induration, or other signs of local infection.  Skin prepped in a sterile fashion.  Local anesthesia: Topical Ethyl chloride.  With sterile technique and under real time ultrasound guidance:   1 mL Kenalog 3, 1 mL lidocaine injected easily Completed without difficulty  Pain immediately resolved suggesting accurate placement of the medication.  Advised to call if fevers/chills, erythema, induration, drainage, or persistent bleeding.  Images permanently stored and available for review in the ultrasound unit.  Impression: Technically successful ultrasound guided injection.  Procedure: Real-time Ultrasound Guided Injection of right subacromial bursa Device: GE Logiq E  Verbal informed consent obtained.  Time-out conducted.  Noted no overlying erythema, induration, or other signs of local infection.  Skin prepped in a sterile fashion.  Local anesthesia: Topical Ethyl chloride.  With sterile technique and under real time ultrasound guidance:   1 mL Kenalog 40, 1 mL lidocaine, 1 mL bupivacaine injected easily Completed without difficulty  Pain immediately resolved suggesting accurate placement of the medication.  Advised to call if fevers/chills, erythema, induration, drainage, or persistent bleeding.  Images permanently stored and available for review in the ultrasound unit.  Impression: Technically successful ultrasound guided injection.  Procedure: Real-time Ultrasound Guided Injection of right glenohumeral joint Device: GE Logiq E  Verbal informed consent obtained.  Time-out conducted.  Noted no overlying erythema, induration, or other signs of local infection.  Skin prepped in a sterile fashion.  Local anesthesia: Topical Ethyl chloride.  With sterile technique and under real time ultrasound guidance:   Using a posterior approach and taking care to avoid the labrum I injected 1 mL kenalog 40, 2 mL lidocaine, 2 mL bupivacaine. Completed without  difficulty  Pain immediately resolved suggesting accurate placement of  the medication.  Advised to call if fevers/chills, erythema, induration, drainage, or persistent bleeding.  Images permanently stored and available for review in the ultrasound unit.  Impression: Technically successful ultrasound guided injection.  Impression and Recommendations:    Left shoulder pain Multifactorial with acromioclavicular, subacromial, and glenohumeral pain generators, the structures were injected about 6 months ago and she did well, repeating these injections today. Return as needed.  Lumbar spondylosis  Continue treatment with physiatry, I'm happy to consult as well for this.  Left spastic hemiparesis ([facility])  Referral to occupational therapy for a custom splint for hand spasticity.

## 2017-05-03 NOTE — Progress Notes (Signed)
Paramedicine Encounter    Patient ID: [name], female    DOB: 1966/08/31, 51 y.o.   MRN: [id number]    Patient Care Team: Minus Liberty, MD as PCP - General  Patient Active Problem List   Diagnosis Date Noted  . Perimenopausal 03/28/2017  . Chronic use of opiate for therapeutic purpose 12/14/2016  . Lumbago 11/22/2016  . Vaginal odor 09/10/2016  . Papular eruption 07/14/2016  . Type 2 diabetes mellitus, uncontrolled, with neuropathy ([place]) 02/04/2016  . Left spastic hemiparesis ([place]) 09/23/2015  . Constipation 09/09/2015  . Left shoulder pain 06/30/2015  . Localization-related symptomatic epilepsy and epileptic syndromes with complex partial seizures, not intractable, without status epilepticus ([place]) 06/13/2015  . Hemiparesis affecting left side as late effect of cerebrovascular accident ([place]) 06/13/2015  . Morbid obesity ([place]) 06/13/2015  . Ulcer of left foot ([place]) 06/11/2015  . Candidal intertrigo 11/05/2014  . History of seizure 10/14/2014  . Vitamin D deficiency 10/01/2014  . Tachycardia 07/04/2014  . Generalized weakness 07/04/2014  . Perimenopausal menorrhagia 12/20/2013  . Stroke ([place]) 12/13/2013  . Biventricular implantable cardiac defibrillator -Medtronic 11/23/2013  . Mixed stress and urge urinary incontinence 10/11/2013  . Health care maintenance 09/13/2013  . Chronic combined systolic and diastolic heart failure ([place]) 08/01/2013  . Nonischemic cardiomyopathy ([place]) 08/01/2013  . LBBB (left bundle branch block) 07/19/2013  . Depression 04/09/2013  . Primary osteoarthritis of left knee 04/14/2012  . Lumbar spondylosis 04/14/2012  . Diabetic peripheral neuropathy ([place]) 04/14/2012  . Hyperlipidemia 11/23/2011  . OSA (obstructive sleep apnea) 08/27/2011  . Benign essential HTN 08/27/2010    Current Outpatient Prescriptions:  .  ACCU-CHEK FASTCLIX LANCETS MISC, Check blood sugar 3 times a day, Disp: 102 each, Rfl: 12 .  ACCU-CHEK SMARTVIEW test strip,  USE TO CHECK BLOOD SUGAR THREE TIMES DAILY, Disp: 100 each, Rfl: 12 .  amLODipine (NORVASC) 10 MG tablet, TAKE 1 TABLET BY MOUTH EVERY DAY, Disp: 90 tablet, Rfl: 3 .  atorvastatin (LIPITOR) 40 MG tablet, TAKE 1 TABLET BY MOUTH EVERY DAY, Disp: 30 tablet, Rfl: 3 .  Blood Glucose Monitoring Suppl (ACCU-CHEK GUIDE) w/Device KIT, 1 each by Does not apply route 3 (three) times daily., Disp: 1 kit, Rfl: 1 .  cyclobenzaprine (FLEXERIL) 10 MG tablet, TAKE ONE TABLET BY MOUTH 3 TIMES A DAY AS NEEDED FOR MUSCLE SPASMS, Disp: 90 tablet, Rfl: 0 .  DIGOX 125 MCG tablet, TAKE ONE TABLET BY MOUTH EVERY DAY, Disp: 30 tablet, Rfl: 11 .  docusate sodium (COLACE) 50 MG capsule, Take 1 capsule (50 mg total) by mouth 2 (two) times daily as needed for mild constipation., Disp: 60 capsule, Rfl: 2 .  furosemide (LASIX) 40 MG tablet, TAKE 1 TABLET BY MOUTH TWICE A WEEK, Disp: 10 tablet, Rfl: 3 .  gabapentin (NEURONTIN) 300 MG capsule, Take 1 capsule (300 mg total) by mouth 2 (two) times daily., Disp: 60 capsule, Rfl: 2 .  hydrALAZINE (APRESOLINE) 50 MG tablet, TAKE 1 & 1/2 TABLETS BY MOUTH 3 TIMES A DAY, Disp: 405 tablet, Rfl: 3 .  Insulin Glargine (LANTUS SOLOSTAR) 100 UNIT/ML Solostar Pen, Inject 52 Units into the skin daily at 10 pm., Disp: 30 mL, Rfl: 6 .  isosorbide mononitrate (IMDUR) 60 MG 24 hr tablet, TAKE 1 & 1/2 TABLETS BY MOUTH EVERY DAY, Disp: 45 tablet, Rfl: 2 .  levETIRAcetam (KEPPRA) 500 MG tablet, TAKE 1 TABLET BY MOUTH TWO TIMES DAILY **MUST NOT MISS ANY DOSE**, Disp: 180 tablet, Rfl: 1 .  metoprolol (  LOPRESSOR) 100 MG tablet, TAKE ONE TABLET BY MOUTH TWICE DAILY, Disp: 60 tablet, Rfl: 2 .  rivaroxaban (XARELTO) 20 MG TABS tablet, Take 1 tablet (20 mg total) by mouth daily with supper., Disp: 30 tablet, Rfl: 3 .  solifenacin (VESICARE) 10 MG tablet, Take 10 mg by mouth daily., Disp: , Rfl:  .  spironolactone (ALDACTONE) 50 MG tablet, Take 1 tablet (50 mg total) by mouth daily., Disp: 90 tablet, Rfl: 1 .   Vitamin D, Ergocalciferol, (DRISDOL) 50000 units CAPS capsule, TAKE ONE CAPSULE BY MOUTH EVERY MONDAY, Disp: 4 capsule, Rfl: 0 .  calamine lotion, Apply 1 application topically as needed for itching., Disp: 120 mL, Rfl: 0 .  citalopram (CELEXA) 20 MG tablet, TAKE 1 TABLET BY MOUTH EVERY DAY, Disp: 90 tablet, Rfl: 2 .  diclofenac sodium (VOLTAREN) 1 % GEL, APPLY 2 GM TOPICALLY FOUR TIMES DAILY AS NEEDED FOR PAIN, Disp: 300 g, Rfl: 0 .  fluticasone (CUTIVATE) 0.05 % cream, Apply topically 2 (two) times daily. Dispense two tubes, Disp: 30 g, Rfl: 6 .  hydrocortisone cream 1 %, Apply to itchy skin 2 times daily, Disp: 30 g, Rfl: 1 .  liver oil-zinc oxide (DESITIN) 40 % ointment, Apply 1 application topically as needed for irritation. To skin folds, Disp: 56.7 g, Rfl: 3 .  metFORMIN (GLUCOPHAGE XR) 500 MG 24 hr tablet, Take 4 tablets (2,000 mg total) by mouth daily., Disp: 120 tablet, Rfl: 11 .  metoprolol succinate (TOPROL-XL) 100 MG 24 hr tablet, TAKE 1 TABLET BY MOUTH TWO TIMES DAILY, Disp: 60 tablet, Rfl: 2 .  mupirocin cream (BACTROBAN) 2 %, Apply 1 application topically 2 (two) times daily., Disp: 15 g, Rfl: 2 .  nystatin (NYSTATIN) powder, Apply to affected area twice daily until healed., Disp: 30 g, Rfl: 2 .  nystatin cream (MYCOSTATIN), APPLY UNDER ABDOMINAL SKIN AND ON BUTTOCKS 2 TIMES A DAY UNTIL RASH RESOLVES, Disp: 30 g, Rfl: 2 .  ondansetron (ZOFRAN ODT) 8 MG disintegrating tablet, Take 1 tablet (8 mg total) by mouth every 8 (eight) hours as needed for nausea or vomiting., Disp: 10 tablet, Rfl: 0 .  oxyCODONE-acetaminophen (PERCOCET) 7.5-325 MG tablet, Take 1 tablet by mouth every 8 (eight) hours as needed., Disp: 90 tablet, Rfl: 0 .  pantoprazole (PROTONIX) 40 MG tablet, Take 1 tablet (40 mg total) by mouth daily., Disp: 90 tablet, Rfl: 1 .  polyethylene glycol (MIRALAX / GLYCOLAX) packet, MIX 1 PACKET IN 8OZ OF LIQUID EVERY DAY, Disp: 14 each, Rfl: 0 .  triamcinolone (KENALOG) 0.025 %  ointment, APPLY TOPICALLY TO LEFT ARM 2 TIMES A DAY UNTIL RASH AND ITCHING RESOLVES, Disp: 30 g, Rfl: 0 .  UNIFINE PENTIPS 31G X 5 MM MISC, USE TO INJECT INSULIN TWO TIMES DAILY, Disp: 100 each, Rfl: 0 Allergies  Allergen Reactions  . Ace Inhibitors Anaphylaxis    Swelling of the tongue and throat   . Clindamycin Anaphylaxis and Swelling  . Enalapril Maleate Anaphylaxis    Swelling of the tongue and throat   . Lisinopril Anaphylaxis    Swelling of the tongue and throat      Social History   Social History  . Marital status: Single    Spouse name: N/A  . Number of children: 2  . Years of education: 9th grade   Occupational History  . Unemployed     planning on getting disability   Social History Main Topics  . Smoking status: Never Smoker  . Smokeless tobacco: Never  Used  . Alcohol use No  . Drug use: No  . Sexual activity: Not on file   Other Topics Concern  . Not on file   Social History Narrative   Lives in [place] with her son. Is able to read and write fluently in [place].    Physical Exam      Future Appointments Date Time Provider [place]  05/16/2017 10:00 AM [facility] PAT 1 [facility] None  05/19/2017 10:00 AM Bayard Hugger, NP [facility] CPR  06/10/2017 10:00 AM [facility] PREVISIT RM 51 [facility]  06/10/2017 11:30 AM Deboraha Sprang, MD [facility]  06/24/2017 8:00 AM Armbruster, Renelda Loma, MD [facility]  06/29/2017 10:00 AM Larey Dresser, MD [facility] None    ATF pt she stated that she is moving to [place] by 05/13/17.  Pt stated that she needs peace of mind therefore she has decided to move.Pt stated that she feels good and her primary physician gave her a good report today. Pt stated that she is going to work on finding her a physician in [place] and home health.  Pt stated that she will be moving with her friends into her brothers home. Pt denies sob, dizziness, headache and chest pain. Pt is working on her low sodium diet. rx  bottles verified and pill box refilled.    BP 118/70 (BP Location: Right Arm, Patient Position: Sitting)   Pulse 90   Resp 16   Wt 241 lb (109.3 kg)   SpO2 98%   BMI 35.59 kg/m   Weight yesterday-240 Last visit weight-250    Findley Vi, EMT Paramedic 05/03/2017    ACTION: Home visit completed

## 2017-05-03 NOTE — Assessment & Plan Note (Signed)
Referral to occupational therapy for a custom splint for hand spasticity.

## 2017-05-03 NOTE — Assessment & Plan Note (Signed)
Multifactorial with acromioclavicular, subacromial, and glenohumeral pain generators, the structures were injected about 6 months ago and she did well, repeating these injections today. Return as needed.

## 2017-05-03 NOTE — Assessment & Plan Note (Signed)
Continue treatment with physiatry, I'm happy to consult as well for this.

## 2017-05-05 ENCOUNTER — Other Ambulatory Visit: Payer: Self-pay | Admitting: Physical Medicine & Rehabilitation

## 2017-05-05 ENCOUNTER — Other Ambulatory Visit (HOSPITAL_COMMUNITY): Payer: Self-pay | Admitting: Cardiology

## 2017-05-05 DIAGNOSIS — M1712 Unilateral primary osteoarthritis, left knee: Secondary | ICD-10-CM

## 2017-05-05 DIAGNOSIS — M47896 Other spondylosis, lumbar region: Secondary | ICD-10-CM

## 2017-05-10 ENCOUNTER — Emergency Department (HOSPITAL_COMMUNITY)
Admission: EM | Admit: 2017-05-10 | Discharge: 2017-05-11 | Disposition: A | Discharge status: Home / Self Care | Payer: Medicaid Other | Attending: Emergency Medicine | Admitting: Emergency Medicine

## 2017-05-10 ENCOUNTER — Encounter (HOSPITAL_COMMUNITY): Payer: Self-pay | Admitting: Emergency Medicine

## 2017-05-10 DIAGNOSIS — K047 Periapical abscess without sinus: Secondary | ICD-10-CM

## 2017-05-10 DIAGNOSIS — I110 Hypertensive heart disease with heart failure: Secondary | ICD-10-CM | POA: Diagnosis not present

## 2017-05-10 DIAGNOSIS — Z8673 Personal history of transient ischemic attack (TIA), and cerebral infarction without residual deficits: Secondary | ICD-10-CM | POA: Insufficient documentation

## 2017-05-10 DIAGNOSIS — B370 Candidal stomatitis: Secondary | ICD-10-CM

## 2017-05-10 DIAGNOSIS — K5903 Drug induced constipation: Secondary | ICD-10-CM

## 2017-05-10 DIAGNOSIS — I10 Essential (primary) hypertension: Secondary | ICD-10-CM | POA: Insufficient documentation

## 2017-05-10 DIAGNOSIS — E1165 Type 2 diabetes mellitus with hyperglycemia: Secondary | ICD-10-CM | POA: Insufficient documentation

## 2017-05-10 DIAGNOSIS — T391X5A Adverse effect of 4-Aminophenol derivatives, initial encounter: Secondary | ICD-10-CM | POA: Insufficient documentation

## 2017-05-10 DIAGNOSIS — Z79899 Other long term (current) drug therapy: Secondary | ICD-10-CM | POA: Diagnosis not present

## 2017-05-10 DIAGNOSIS — I5042 Chronic combined systolic (congestive) and diastolic (congestive) heart failure: Secondary | ICD-10-CM | POA: Insufficient documentation

## 2017-05-10 DIAGNOSIS — R739 Hyperglycemia, unspecified: Secondary | ICD-10-CM

## 2017-05-10 LAB — CBG MONITORING, ED: GLUCOSE-CAPILLARY: 353 mg/dL — AB (ref 65–99)

## 2017-05-10 NOTE — ED Triage Notes (Signed)
Pt c/o dental pain in right lower teeth.  Pt st's she is scheduled to have teeth removed in June but can't stand the pain any more.  Pt also c/o being constipated and sugar being high.

## 2017-05-11 MED ORDER — NYSTATIN 100000 UNIT/ML MT SUSP: OROMUCOSAL | 0 refills | Status: DC

## 2017-05-11 MED ORDER — PENICILLIN V POTASSIUM 500 MG PO TABS: 500.0000 mg | ORAL_TABLET | Freq: Four times a day (QID) | ORAL | 0 refills | Status: DC

## 2017-05-11 MED ORDER — ONDANSETRON 4 MG PO TBDP
4.0000 mg | ORAL_TABLET | Freq: Once | ORAL | Status: AC
  Administered 2017-05-11: 4 mg via ORAL
  Filled 2017-05-11: qty 1

## 2017-05-11 MED ORDER — PENICILLIN V POTASSIUM 250 MG PO TABS
500.0000 mg | ORAL_TABLET | Freq: Once | ORAL | Status: AC
  Administered 2017-05-11: 500 mg via ORAL
  Filled 2017-05-11: qty 2

## 2017-05-11 MED ORDER — OXYCODONE-ACETAMINOPHEN 7.5-325 MG PO TABS
1.0000 | ORAL_TABLET | Freq: Once | ORAL | Status: AC
  Administered 2017-05-11: 1 via ORAL
  Filled 2017-05-11: qty 1

## 2017-05-11 MED ORDER — ONDANSETRON 4 MG PO TBDP: 4.0000 mg | ORAL_TABLET | Freq: Three times a day (TID) | ORAL | 0 refills | Status: DC | PRN

## 2017-05-11 NOTE — Discharge Instructions (Signed)
RETURN TO THE EMERGENCY DEPARTMENT WITH ANY WORSENING SYMPTOMS - UNCONTROLLED VOMITING, BLOOD SUGAR THAT INCREASES, HIGH FEVER, OR NEW CONCERN. KEEP SCHEDULED APPOINTMENT WITH YOUR DENTAL SURGEON FOR PLANNED SURGERY. CHECK YOUR BLOOD SUGAR REGULARLY AND CONTINUE YOUR MEDICATIONS AS PRESCRIBED.

## 2017-05-11 NOTE — ED Provider Notes (Signed)
[facility] DEPT Provider Note   CSN: [id number] Arrival date & time: 05/10/17  1954     History   Chief Complaint Chief Complaint  Patient presents with  . Dental Pain  . Constipation  . Hyperglycemia    HPI [name] is a 51 y.o. female.  Patient presents with dental pain for the past several weeks with facial swelling and increased pain for the past 2-3 days. She has a scheduled dental surgery to remove all lower teeth in June. She is currently taking Percocet 7.5 mg without relief of pain. She reports constipation, she feels, from the narcotic medication use and takes Dulcolax with little results. She is nauseous but denies vomiting. No blood per rectum. No fever. She also reports that her blood pressure is high despite taking her medication, and her blood sugar is high also.    The history is provided by the patient. No language interpreter was used.  Dental Pain    Constipation   Pertinent negatives include no abdominal pain and no dysuria.  Hyperglycemia  Associated symptoms: nausea   Associated symptoms: no abdominal pain, no dysuria, no fever, no increased thirst, no polyuria, no vomiting and no weakness     Past Medical History:  Diagnosis Date  . Allergic rhinitis   . Arthritis    "hips, back, legs, arms" (07/04/2014)  . Asthma    hx  . Automatic implantable cardioverter-defibrillator in situ   . Calcifying tendinitis of shoulder   . Chronic combined systolic and diastolic CHF (congestive heart failure) ([place])    EF 40-45% by echo 12/06/2012  . Chronic diastolic heart failure (HCC)     Primarily diastolic CHF: Likely due to uncontrolled HTN. Last echo (8/12) with EF 45-50%, mild to moderate LVH with some asymmetric septal hypertrophy, RV normal size and systolic function. EF 50-55% by LV-gram in 6/12.   . Chronic lower back pain    secondary to DJD, obsetiy, hip problems. Followed by Dr. Oval Linsey (pain management)  . Coronary artery disease    questionable. LHC 05/2011 showing normal coronaries // Followed at [facility], Dr. Aundra Dubin  . Degeneration of lumbar or lumbosacral intervertebral disc   . DJD (degenerative joint disease) of hip    right sided  . Frequent UTI   . GERD (gastroesophageal reflux disease)   . HLD (hyperlipidemia)   . Hypertension    Poorly controlled. Has had HTN since age 64. Angioedema with ACEI.  24 Hr urine and renal arterial dopplers ordered . . . Never done  . LBBB (left bundle branch block)   . Liver disease   . Morbid obesity ([place])   . NICM (nonischemic cardiomyopathy) ([place])    EF 45-50% in 8/12, cath 6/12 showed normal coronaries, EF 50-55% by LV gram  . OSA on CPAP    sleep study in 8/12 showed moderate to severe OSA requiring CPAP  . Polyneuropathy in diabetes(357.2)   . Presence of permanent cardiac pacemaker   . Seizures ([place])    last 3 months  . Shortness of breath    none now  . Stroke [facility]) 12/2013   "my left side is paralyzed" (07/04/2014)  . Thoracic or lumbosacral neuritis or radiculitis, unspecified   . Type II diabetes mellitus ([place]) DX: 2002    Patient Active Problem List   Diagnosis Date Noted  . Perimenopausal 03/28/2017  . Chronic use of opiate for therapeutic purpose 12/14/2016  . Lumbago 11/22/2016  . Vaginal odor 09/10/2016  . Papular  eruption 07/14/2016  . Type 2 diabetes mellitus, uncontrolled, with neuropathy ([place]) 02/04/2016  . Left spastic hemiparesis ([place]) 09/23/2015  . Constipation 09/09/2015  . Left shoulder pain 06/30/2015  . Localization-related symptomatic epilepsy and epileptic syndromes with complex partial seizures, not intractable, without status epilepticus ([place]) 06/13/2015  . Hemiparesis affecting left side as late effect of cerebrovascular accident ([place]) 06/13/2015  . Morbid obesity ([place]) 06/13/2015  . Ulcer of left foot ([place]) 06/11/2015  . Candidal intertrigo 11/05/2014  . History of seizure 10/14/2014  . Vitamin D deficiency 10/01/2014  .  Tachycardia 07/04/2014  . Generalized weakness 07/04/2014  . Perimenopausal menorrhagia 12/20/2013  . Stroke ([place]) 12/13/2013  . Biventricular implantable cardiac defibrillator -[employer] 11/23/2013  . Mixed stress and urge urinary incontinence 10/11/2013  . Health care maintenance 09/13/2013  . Chronic combined systolic and diastolic heart failure ([place]) 08/01/2013  . Nonischemic cardiomyopathy ([place]) 08/01/2013  . LBBB (left bundle branch block) 07/19/2013  . Depression 04/09/2013  . Primary osteoarthritis of left knee 04/14/2012  . Lumbar spondylosis 04/14/2012  . Diabetic peripheral neuropathy ([place]) 04/14/2012  . Hyperlipidemia 11/23/2011  . OSA (obstructive sleep apnea) 08/27/2011  . Benign essential HTN 08/27/2010    Past Surgical History:  Procedure Laterality Date  . BI-VENTRICULAR IMPLANTABLE CARDIOVERTER DEFIBRILLATOR N/A 08/22/2013   Procedure: BI-VENTRICULAR IMPLANTABLE CARDIOVERTER DEFIBRILLATOR  (CRT-D);  Surgeon: Deboraha Sprang, MD;  Location[facility] C[facility] LAB;  Service: Cardiovascular;  Laterality: N/A;  . BI-VENTRICULAR IMPLANTABLE CARDIOVERTER DEFIBRILLATOR  (CRT-D)  08/2013   Archie Endo 08/23/2013  . BREAST SURGERY Bilateral 2011   patient reports benign results  . CARDIAC CATHETERIZATION  05/2011  . CARPAL TUNNEL RELEASE Left    denies  . HEMIARTHROPLASTY SHOULDER FRACTURE Right 1980's   denies  . MULTIPLE TOOTH EXTRACTIONS  ~ 2011   tumors removed ; "my whole top"  . SHOULDER ARTHROSCOPY Right 12/26/2015   Procedure: Right Shoulder Arthroscopy, Debridement, and Decompression;  Surgeon: Newt Minion, MD;  Location: [facility];  Service: Orthopedics;  Laterality: Right;  . TEE WITHOUT CARDIOVERSION N/A 01/14/2014   Procedure: TRANSESOPHAGEAL ECHOCARDIOGRAM (TEE);  Surgeon: Sanda Klein, MD;  Location: [facility];  Service: Cardiovascular;  Laterality: N/A;  . TUBAL LIGATION  05/31/1985    OB History    No data available       Home Medications    Prior to Admission  medications   Medication Sig Start Date End Date Taking? Authorizing Provider  ACCU-CHEK FASTCLIX LANCETS MISC Check blood sugar 3 times a day 09/10/16   Asencion Partridge, MD  ACCU-CHEK SMARTVIEW test strip USE TO CHECK BLOOD SUGAR THREE TIMES DAILY 11/10/16   Minus Liberty, MD  amLODipine (NORVASC) 10 MG tablet TAKE 1 TABLET BY MOUTH EVERY DAY 02/16/17   Larey Dresser, MD  atorvastatin (LIPITOR) 40 MG tablet TAKE 1 TABLET BY MOUTH EVERY DAY 01/26/17   Larey Dresser, MD  Blood Glucose Monitoring Suppl (ACCU-CHEK GUIDE) w/Device KIT 1 each by Does not apply route 3 (three) times daily. 09/10/16   Asencion Partridge, MD  calamine lotion Apply 1 application topically as needed for itching. 07/14/16   Burns, Arloa Koh, MD  citalopram (CELEXA) 20 MG tablet TAKE 1 TABLET BY MOUTH EVERY DAY 11/27/16   Minus Liberty, MD  cyclobenzaprine (FLEXERIL) 10 MG tablet TAKE ONE TABLET BY MOUTH 3 TIMES A DAY AS NEEDED FOR MUSCLE SPASMS 02/16/17   Meredith Staggers, MD  diclofenac sodium (VOLTAREN) 1 % GEL APPLY 2 GM TOPICALLY FOUR TIMES DAILY AS  NEEDED FOR PAIN 05/05/17   Bayard Hugger, NP  DIGOX 125 MCG tablet TAKE ONE TABLET BY MOUTH EVERY DAY 01/24/17   Bensimhon, Shaune Pascal, MD  docusate sodium (COLACE) 50 MG capsule Take 1 capsule (50 mg total) by mouth 2 (two) times daily as needed for mild constipation. 02/21/17   Minus Liberty, MD  fluticasone (CUTIVATE) 0.05 % cream Apply topically 2 (two) times daily. Dispense two tubes 02/25/15   Corky Sox, MD  furosemide (LASIX) 40 MG tablet TAKE 1 TABLET BY MOUTH TWICE A WEEK 04/08/17   Bensimhon, Shaune Pascal, MD  gabapentin (NEURONTIN) 300 MG capsule Take 1 capsule (300 mg total) by mouth 2 (two) times daily. 01/25/17   Suzan Slick, NP  hydrALAZINE (APRESOLINE) 50 MG tablet TAKE 1 & 1/2 TABLETS BY MOUTH 3 TIMES A DAY 02/16/17   Larey Dresser, MD  hydrocortisone cream 1 % Apply to itchy skin 2 times daily 03/28/17 03/28/18  Minus Liberty, MD  Insulin Glargine  (LANTUS SOLOSTAR) 100 UNIT/ML Solostar Pen Inject 52 Units into the skin daily at 10 pm. 04/21/17   Minus Liberty, MD  isosorbide mononitrate (IMDUR) 60 MG 24 hr tablet TAKE 1 & 1/2 TABLETS BY MOUTH EVERY DAY 01/26/17   Larey Dresser, MD  levETIRAcetam (KEPPRA) 500 MG tablet TAKE 1 TABLET BY MOUTH TWO TIMES DAILY **MUST NOT MISS ANY DOSE** 09/16/16   Minus Liberty, MD  liver oil-zinc oxide (DESITIN) 40 % ointment Apply 1 application topically as needed for irritation. To skin folds 03/28/17   Minus Liberty, MD  metFORMIN (GLUCOPHAGE XR) 500 MG 24 hr tablet Take 4 tablets (2,000 mg total) by mouth daily. 03/28/17 03/28/18  Minus Liberty, MD  metoprolol (LOPRESSOR) 100 MG tablet TAKE ONE TABLET BY MOUTH TWICE DAILY 04/08/17   Bensimhon, Shaune Pascal, MD  metoprolol succinate (TOPROL-XL) 100 MG 24 hr tablet TAKE 1 TABLET BY MOUTH TWO TIMES DAILY 01/26/17   Larey Dresser, MD  mupirocin cream (BACTROBAN) 2 % Apply 1 application topically 2 (two) times daily. 07/14/16   Burns, Arloa Koh, MD  nystatin (NYSTATIN) powder Apply to affected area twice daily until healed. 04/18/17   Minus Liberty, MD  nystatin cream (MYCOSTATIN) APPLY UNDER ABDOMINAL SKIN AND ON BUTTOCKS 2 TIMES A DAY UNTIL RASH RESOLVES 04/18/17   Minus Liberty, MD  ondansetron (ZOFRAN ODT) 8 MG disintegrating tablet Take 1 tablet (8 mg total) by mouth every 8 (eight) hours as needed for nausea or vomiting. 02/21/17   Minus Liberty, MD  oxyCODONE-acetaminophen (PERCOCET) 7.5-325 MG tablet Take 1 tablet by mouth every 8 (eight) hours as needed. 04/18/17   Meredith Staggers, MD  pantoprazole (PROTONIX) 40 MG tablet Take 1 tablet (40 mg total) by mouth daily. 03/18/17   Minus Liberty, MD  polyethylene glycol [facility] / Floria Raveling) packet MIX 1 PACKET IN 8OZ OF LIQUID EVERY DAY 09/16/16   Minus Liberty, MD  rivaroxaban (XARELTO) 20 MG TABS tablet Take 1 tablet (20 mg total) by mouth daily with supper. 03/24/17    Minus Liberty, MD  solifenacin (VESICARE) 10 MG tablet Take 10 mg by mouth daily.    [provider]  spironolactone (ALDACTONE) 50 MG tablet Take 1 tablet (50 mg total) by mouth daily. 03/24/17   Minus Liberty, MD  triamcinolone (KENALOG) 0.025 % ointment APPLY TOPICALLY TO LEFT ARM 2 TIMES A DAY UNTIL RASH AND ITCHING RESOLVES 07/19/16   Annia Belt, MD  UNIFINE PENTIPS 31G X 5 MM MISC USE  TO INJECT INSULIN TWO TIMES DAILY 02/17/17   Minus Liberty, MD  Vitamin D, Ergocalciferol, (DRISDOL) 50000 units CAPS capsule TAKE ONE CAPSULE BY MOUTH EVERY MONDAY 02/17/17   Minus Liberty, MD    Family History Family History  Problem Relation Age of Onset  . Heart disease Mother 31       Died of MI at age 43 yo  . Kidney disease Mother        requiring dialysis  . Congestive Heart Failure Mother   . Heart disease Father 44       MI age 27yo requiring stenting  . Diabetes Father   . Glaucoma Father   . Heart disease Paternal Grandmother        requiring pacemaker.  [name] Heart disease Paternal Grandfather 34       Died of MI at possibly age 85-53yo  . Stroke Paternal Grandfather   . Diabetes Brother   . Heart disease Brother 88       MI at age 30 years old  . Breast cancer Paternal Aunt   . Breast cancer Maternal Grandmother     Social History Social History  Substance Use Topics  . Smoking status: Never Smoker  . Smokeless tobacco: Never Used  . Alcohol use No     Allergies   Ace inhibitors; Clindamycin; Enalapril maleate; and Lisinopril   Review of Systems Review of Systems  Constitutional: Negative for chills and fever.  HENT: Positive for dental problem and facial swelling. Negative for trouble swallowing.   Respiratory: Negative.   Cardiovascular: Negative.   Gastrointestinal: Positive for constipation and nausea. Negative for abdominal distention, abdominal pain and vomiting.  Endocrine: Negative for polydipsia and polyuria.    Genitourinary: Negative for dysuria.  Musculoskeletal: Negative.  Negative for myalgias.  Skin: Negative.   Neurological: Negative.  Negative for weakness and light-headedness.     Physical Exam Updated Vital Signs BP (!) 185/123 (BP Location: Right Arm)   Pulse (!) 102   Temp 98.4 F (36.9 C) (Oral)   Resp 19   Ht '5\' 9"'$  (1.753 m)   Wt 109.3 kg (241 lb)   LMP 05/10/2017   SpO2 99%   BMI 35.59 kg/m   Physical Exam  Constitutional: She is oriented to person, place, and time. She appears well-developed and well-nourished.  HENT:  Right facial swelling along anterior mandible with palpable induration. Multiple dental caries and widespread dental decay. Small abscess at #26. White buccal plaques and mild desquamination c/w thrush.   Neck: Normal range of motion.  Pulmonary/Chest: Effort normal. No respiratory distress.  Abdominal: She exhibits no distension. There is no tenderness.  Lymphadenopathy:    She has cervical adenopathy.  Neurological: She is alert and oriented to person, place, and time.  Skin: Skin is warm and dry.     ED Treatments / Results  Labs (all labs ordered are listed, but only abnormal results are displayed) Labs Reviewed  CBG MONITORING, ED - Abnormal; Notable for the following:       Result Value   Glucose-Capillary 353 (*)    All other components within normal limits    EKG  EKG Interpretation None       Radiology No results found.  Procedures Procedures (including critical care time)  Medications Ordered in ED Medications  penicillin v potassium (VEETID) tablet 500 mg (not administered)  ondansetron (ZOFRAN-ODT) disintegrating tablet 4 mg (not administered)     Initial Impression / Assessment and Plan / ED Course  I have reviewed the triage vital signs and the nursing notes.  Pertinent labs & imaging results that were available during my care of the patient were reviewed by me and considered in my medical decision making (see  chart for details).     Patient with facial swelling and ongoing dental pain related to abscess at #26. Suspect this contributes to her high blood pressure. CBG 373 without vomiting, polyuria or polydipsia. Close following of her sugar is recommended.   Abdomen is nontender and nondistended. She is moving her bowels but in very small amounts. Recommended addition of Miralax up to 3 times daily and a stool softener while at opiates.   She can be discharged home with scheduled follow up with her dentist. Blood pressure and blood sugar should be followed up with her doctor. Return precautions discussed.   Final Clinical Impressions(s) / ED Diagnoses   Final diagnoses:  None   1. Dental abscess 2. Hypertension 3. Hyperglycemia 4. Constipation 5. Oral thrust  New Prescriptions New Prescriptions   No medications on file     Charlann Lange, Hershal Coria 05/11/17 0055    Charlann Lange, PA-C 05/11/17 0056    Charlann Lange, PA-C 94/80/16 [id number]    Delora Fuel, MD 48/27/07 [phone]

## 2017-05-16 ENCOUNTER — Encounter (HOSPITAL_COMMUNITY)
Admission: RE | Admit: 2017-05-16 | Discharge: 2017-05-16 | Disposition: A | Discharge status: Home / Self Care | Payer: Medicaid Other | Source: Ambulatory Visit | Attending: Oral Surgery | Admitting: Oral Surgery

## 2017-05-16 ENCOUNTER — Encounter (HOSPITAL_COMMUNITY): Payer: Self-pay

## 2017-05-16 DIAGNOSIS — I428 Other cardiomyopathies: Secondary | ICD-10-CM | POA: Insufficient documentation

## 2017-05-16 DIAGNOSIS — M1712 Unilateral primary osteoarthritis, left knee: Secondary | ICD-10-CM | POA: Diagnosis not present

## 2017-05-16 DIAGNOSIS — E559 Vitamin D deficiency, unspecified: Secondary | ICD-10-CM | POA: Insufficient documentation

## 2017-05-16 DIAGNOSIS — Z01810 Encounter for preprocedural cardiovascular examination: Secondary | ICD-10-CM | POA: Insufficient documentation

## 2017-05-16 DIAGNOSIS — E785 Hyperlipidemia, unspecified: Secondary | ICD-10-CM | POA: Insufficient documentation

## 2017-05-16 DIAGNOSIS — Z01812 Encounter for preprocedural laboratory examination: Secondary | ICD-10-CM | POA: Insufficient documentation

## 2017-05-16 DIAGNOSIS — F329 Major depressive disorder, single episode, unspecified: Secondary | ICD-10-CM | POA: Diagnosis not present

## 2017-05-16 DIAGNOSIS — M47816 Spondylosis without myelopathy or radiculopathy, lumbar region: Secondary | ICD-10-CM | POA: Diagnosis not present

## 2017-05-16 DIAGNOSIS — G4733 Obstructive sleep apnea (adult) (pediatric): Secondary | ICD-10-CM | POA: Diagnosis not present

## 2017-05-16 DIAGNOSIS — I5042 Chronic combined systolic (congestive) and diastolic (congestive) heart failure: Secondary | ICD-10-CM | POA: Insufficient documentation

## 2017-05-16 DIAGNOSIS — E1142 Type 2 diabetes mellitus with diabetic polyneuropathy: Secondary | ICD-10-CM | POA: Diagnosis not present

## 2017-05-16 DIAGNOSIS — I119 Hypertensive heart disease without heart failure: Secondary | ICD-10-CM | POA: Insufficient documentation

## 2017-05-16 DIAGNOSIS — I447 Left bundle-branch block, unspecified: Secondary | ICD-10-CM | POA: Diagnosis not present

## 2017-05-16 LAB — CBC
HEMATOCRIT: 43.6 % (ref 36.0–46.0)
Hemoglobin: 13.9 g/dL (ref 12.0–15.0)
MCH: 26.7 pg (ref 26.0–34.0)
MCHC: 31.9 g/dL (ref 30.0–36.0)
MCV: 83.8 fL (ref 78.0–100.0)
Platelets: 260 10*3/uL (ref 150–400)
RBC: 5.2 MIL/uL — ABNORMAL HIGH (ref 3.87–5.11)
RDW: 12.8 % (ref 11.5–15.5)
WBC: 15.8 10*3/uL — ABNORMAL HIGH (ref 4.0–10.5)

## 2017-05-16 LAB — BASIC METABOLIC PANEL
Anion gap: 9 (ref 5–15)
BUN: 15 mg/dL (ref 6–20)
CALCIUM: 9.4 mg/dL (ref 8.9–10.3)
CO2: 27 mmol/L (ref 22–32)
Chloride: 98 mmol/L — ABNORMAL LOW (ref 101–111)
Creatinine, Ser: 0.9 mg/dL (ref 0.44–1.00)
GFR calc Af Amer: >60 mL/min (ref >60)
GLUCOSE: 276 mg/dL — AB (ref 65–99)
POTASSIUM: 3.5 mmol/L (ref 3.5–5.1)
Sodium: 134 mmol/L — ABNORMAL LOW (ref 135–145)

## 2017-05-16 LAB — PROTIME-INR
INR: 1.1
Prothrombin Time: 14.3 seconds (ref 11.4–15.2)

## 2017-05-16 LAB — GLUCOSE, CAPILLARY: Glucose-Capillary: 275 mg/dL — ABNORMAL HIGH (ref 65–99)

## 2017-05-16 LAB — HCG, SERUM, QUALITATIVE: Preg, Serum: NEGATIVE

## 2017-05-16 NOTE — Progress Notes (Addendum)
PCP - Alm Bustard Cardiologist - Shirlee Latch, Benismone Ppm Graciela Husbands  Chest x-ray - not needed EKG - 05/16/17 Stress Test - denies ECHO - 01/31/17 Cardiac Cath - 2012  Sleep Study - 09/03/11 CPAP - wears at night - instructed to bring with her the day of surgery  Fasting Blood Sugar - 167 Checks Blood Sugar ___1x__ times a day  Patient's blood sugar was in the 300s about a week ago This morning patient has not taken her diabetic medications  Patient has a history of afib and is Xarelto - patient stated that she "is not comfortable taking the Xarelto" she took her last dose on 05/11/17  She has a Home Health Aid who comes out to help her with daily activities from [facility] in [place]  Spoke with Marylene Land NP - patient needs to do remote device check at home - talked to patient about do that at home, also patient needs to see Dr Shirlee Latch - gave patient office number and instructed her to make an appointment    Patient denies shortness of breath, fever, cough and chest pain at PAT appointment   Patient verbalized understanding of instructions that were given to them at the PAT appointment. Patient was also instructed that they will need to review over the PAT instructions again at home before surgery.

## 2017-05-16 NOTE — Progress Notes (Signed)
[employer] rep Leta Jungling contacted about surgery and date and time

## 2017-05-16 NOTE — Pre-Procedure Instructions (Signed)
[name]  05/16/2017      [facility] - [place], [place] - [address] [place] [address] Phone: [phone] Fax: [phone]    Your procedure is scheduled on June 8  Report to [facility] Admitting at 0800 A.M.  Call this number if you have problems the morning of surgery:  [phone]   Remember:  Do not eat food or drink liquids after midnight.   Take these medicines the morning of surgery with A SIP OF WATER DIGOX,  amLODipine (NORVASC), citalopram (CELEXA), gabapentin (NEURONTIN), hydrALAZINE (APRESOLINE), isosorbide mononitrate (IMDUR), levETIRAcetam (KEPPRA), metoprolol (LOPRESSOR), oxyCODONE-acetaminophen (PERCOCET) , pantoprazole (PROTONIX,   7 days prior to surgery STOP taking any Aspirin, Aleve, Naproxen, Ibuprofen, Motrin, Advil, Goody's, BC's, all herbal medications, fish oil, and all vitamins  FOLLOW PHYSICIAN's INSTRUCTIONS ABOUT XARELTO  WHAT DO I DO ABOUT MY DIABETES MEDICATION?   [name] Do not take oral diabetes medicines (pills) the morning of surgery. metFORMIN (GLUCOPHAGE XR)  . THE NIGHT BEFORE SURGERY, take ___26________ units of ___Insulin Glargine (LANTUS SOLOSTAR)_____insulin.   How to Manage Your Diabetes Before and After Surgery  Why is it important to control my blood sugar before and after surgery? . Improving blood sugar levels before and after surgery helps healing and can limit problems. . A way of improving blood sugar control is eating a healthy diet by: o  Eating less sugar and carbohydrates o  Increasing activity/exercise o  Talking with your doctor about reaching your blood sugar goals . High blood sugars (greater than 180 mg/dL) can raise your risk of infections and slow your recovery, so you will need to focus on controlling your diabetes during the weeks before surgery. . Make sure that the doctor who takes care of your diabetes knows about your planned surgery including  the date and location.  How do I manage my blood sugar before surgery? . Check your blood sugar at least 4 times a day, starting 2 days before surgery, to make sure that the level is not too high or low. o Check your blood sugar the morning of your surgery when you wake up and every 2 hours until you get to the Short Stay unit. . If your blood sugar is less than 70 mg/dL, you will need to treat for low blood sugar: o Do not take insulin. o Treat a low blood sugar (less than 70 mg/dL) with  cup of clear juice (cranberry or apple), 4 glucose tablets, OR glucose gel. o Recheck blood sugar in 15 minutes after treatment (to make sure it is greater than 70 mg/dL). If your blood sugar is not greater than 70 mg/dL on recheck, call [phone] for further instructions. . Report your blood sugar to the short stay nurse when you get to Short Stay.  . If you are admitted to the hospital after surgery: o Your blood sugar will be checked by the staff and you will probably be given insulin after surgery (instead of oral diabetes medicines) to make sure you have good blood sugar levels. o The goal for blood sugar control after surgery is 80-180 mg/dL.     Do not wear jewelry, make-up or nail polish.  Do not wear lotions, powders, or perfumes, or deoderant.  Do not shave 48 hours prior to surgery.    Do not bring valuables to the hospital.  [facility] is not responsible for any belongings or valuables.  Contacts, dentures or bridgework  may not be worn into surgery.  Leave your suitcase in the car.  After surgery it may be brought to your room.  For patients admitted to the hospital, discharge time will be determined by your treatment team.  Patients discharged the day of surgery will not be allowed to drive home.    Special instructions:   [facility]- Preparing For Surgery  Before surgery, you can play an important role. Because skin is not sterile, your skin needs to be as free of germs as  possible. You can reduce the number of germs on your skin by washing with CHG (chlorahexidine gluconate) Soap before surgery.  CHG is an antiseptic cleaner which kills germs and bonds with the skin to continue killing germs even after washing.  Please do not use if you have an allergy to CHG or antibacterial soaps. If your skin becomes reddened/irritated stop using the CHG.  Do not shave (including legs and underarms) for at least 48 hours prior to first CHG shower. It is OK to shave your face.  Please follow these instructions carefully.   1. Shower the NIGHT BEFORE SURGERY and the MORNING OF SURGERY with CHG.   2. If you chose to wash your hair, wash your hair first as usual with your normal shampoo.  3. After you shampoo, rinse your hair and body thoroughly to remove the shampoo.  4. Use CHG as you would any other liquid soap. You can apply CHG directly to the skin and wash gently with a scrungie or a clean washcloth.   5. Apply the CHG Soap to your body ONLY FROM THE NECK DOWN.  Do not use on open wounds or open sores. Avoid contact with your eyes, ears, mouth and genitals (private parts). Wash genitals (private parts) with your normal soap.  6. Wash thoroughly, paying special attention to the area where your surgery will be performed.  7. Thoroughly rinse your body with warm water from the neck down.  8. DO NOT shower/wash with your normal soap after using and rinsing off the CHG Soap.  9. Pat yourself dry with a CLEAN TOWEL.   10. Wear CLEAN PAJAMAS   11. Place CLEAN SHEETS on your bed the night of your first shower and DO NOT SLEEP WITH PETS.    Day of Surgery: Do not apply any deodorants/lotions. Please wear clean clothes to the hospital/surgery center.      Please read over the following fact sheets that you were given.

## 2017-05-17 LAB — HEMOGLOBIN A1C
Hgb A1c MFr Bld: 11 % — ABNORMAL HIGH (ref 4.8–5.6)
Mean Plasma Glucose: 269 mg/dL

## 2017-05-17 NOTE — Progress Notes (Addendum)
Anesthesia Chart Review:  Patient is a 51 year old female scheduled for multiple dental extraction on 05/20/2017 with [name], DDS  - PCP is Alm Bustard, M.D. at the [facility] - HF cardiologist is Marca Ancona, MD. Last office visit 01/31/17; f/u in 3 months recommended. Pt was a no show for 05/01/17 appt.  - EP cardiologist is Sherryl Manges, M.D. Last office visit 08/21/14.   PMH includes: Nonischemic cardiomyopathy, ICD ([employer] Bi-V CRT-D implanted 08/22/13), chronic CHF, LBBB, atrial fibrillation, HTN, DM, hyperlipidemia, OSA, stroke (12/2013), liver disease, asthma, seizures, GERD. Never smoker. BMI 35.5. S/p R shoulder arthroscopy 12/26/15.   - ED visit 05/11/17 for dental pain, abscess. Placed on penicillin  Medications include: Amlodipine, Lipitor, digoxin, Lasix, hydralazine, Lantus, Imdur, Keppra, metformin, metoprolol, Protonix, potassium, xarelto. Last dose xarelto 05/11/17  Preoperative labs reviewed.   - HbA1c 11.0, glucose 276.  - WBC 15.8. Pt currently has dental abscess.   CXR 07/20/16: Enlargement of cardiac silhouette post AICD. No acute abnormalities.  EKG 05/16/17: Atrial-sensed ventricular-paced rhythm  Echo 01/31/17: Left ventricle: The cavity size was normal. Wall thickness was increased in a pattern of moderate LVH. The estimated ejection fraction was 45%. Diffuse hypokinesis. Doppler parameters are consistent with abnormal left ventricular relaxation (grade 1 diastolic dysfunction). - Aortic valve: There was no stenosis. - Mitral valve: There was no significant regurgitation. - Right ventricle: The cavity size was normal. Pacer wire or catheter noted in right ventricle. Systolic function was normal. - Tricuspid valve: No complete TR doppler jet so unable to estimate PA systolic pressure. - Inferior vena cava: The vessel was normal in size. The respirophasic diameter changes were in the normal range (= 50%), consistent with normal central venous  pressure. - Impressions: Normal LV size with moderate LV hypertrophy. EF 45%, diffuse hypokinesis. Normal RV size and systolic function. No significant valvular abnormalities.  Cardiac cath 06/10/11:  1. Normal coronary arteries. 2. Normal left ventricular function. 3. Severe hypertension.  Pt is overdue for an AICD device check (last 12/03/15).  Rep will need to interrogate device DOS.   I notified Sam in Dr. Randa Evens office of uncontrolled DM.   Reviewed case with Dr. Sandford Craze. If no changes, I anticipate pt can proceed with surgery as scheduled.   Rica Mast, FNP-BC [facility]/Anesthesiology Phone: [phone] 05/17/2017 3:42 PM  Addendum:   Pt was evaluated by Suzzette Righter, NP with cardiology 05/19/17 and cleared for dental surgery.   [employer] rep was notified of date/time of surgery.   Rica Mast, FNP-BC [facility]/Anesthesiology Phone: [phone] 05/19/2017 1:36 PM

## 2017-05-19 ENCOUNTER — Telehealth: Payer: Self-pay | Admitting: Dietician

## 2017-05-19 ENCOUNTER — Ambulatory Visit (HOSPITAL_COMMUNITY)
Admission: RE | Admit: 2017-05-19 | Discharge: 2017-05-19 | Disposition: A | Discharge status: Home / Self Care | Payer: Medicaid Other | Source: Ambulatory Visit | Attending: Cardiology | Admitting: Cardiology

## 2017-05-19 ENCOUNTER — Encounter: Payer: Medicaid Other | Admitting: Registered Nurse

## 2017-05-19 VITALS — BP 166/110 | HR 114 | Wt 232.0 lb

## 2017-05-19 DIAGNOSIS — K219 Gastro-esophageal reflux disease without esophagitis: Secondary | ICD-10-CM | POA: Insufficient documentation

## 2017-05-19 DIAGNOSIS — E785 Hyperlipidemia, unspecified: Secondary | ICD-10-CM | POA: Insufficient documentation

## 2017-05-19 DIAGNOSIS — I110 Hypertensive heart disease with heart failure: Secondary | ICD-10-CM | POA: Insufficient documentation

## 2017-05-19 DIAGNOSIS — I480 Paroxysmal atrial fibrillation: Secondary | ICD-10-CM | POA: Diagnosis not present

## 2017-05-19 DIAGNOSIS — Z794 Long term (current) use of insulin: Secondary | ICD-10-CM | POA: Diagnosis not present

## 2017-05-19 DIAGNOSIS — I10 Essential (primary) hypertension: Secondary | ICD-10-CM | POA: Diagnosis not present

## 2017-05-19 DIAGNOSIS — I5042 Chronic combined systolic (congestive) and diastolic (congestive) heart failure: Secondary | ICD-10-CM | POA: Insufficient documentation

## 2017-05-19 DIAGNOSIS — I4891 Unspecified atrial fibrillation: Secondary | ICD-10-CM | POA: Insufficient documentation

## 2017-05-19 DIAGNOSIS — Z7901 Long term (current) use of anticoagulants: Secondary | ICD-10-CM | POA: Diagnosis not present

## 2017-05-19 DIAGNOSIS — Z01818 Encounter for other preprocedural examination: Secondary | ICD-10-CM | POA: Diagnosis not present

## 2017-05-19 DIAGNOSIS — E1142 Type 2 diabetes mellitus with diabetic polyneuropathy: Secondary | ICD-10-CM | POA: Diagnosis not present

## 2017-05-19 DIAGNOSIS — I428 Other cardiomyopathies: Secondary | ICD-10-CM | POA: Insufficient documentation

## 2017-05-19 DIAGNOSIS — Z8673 Personal history of transient ischemic attack (TIA), and cerebral infarction without residual deficits: Secondary | ICD-10-CM | POA: Insufficient documentation

## 2017-05-19 DIAGNOSIS — I5022 Chronic systolic (congestive) heart failure: Secondary | ICD-10-CM | POA: Diagnosis present

## 2017-05-19 DIAGNOSIS — Z79899 Other long term (current) drug therapy: Secondary | ICD-10-CM | POA: Diagnosis not present

## 2017-05-19 DIAGNOSIS — I2510 Atherosclerotic heart disease of native coronary artery without angina pectoris: Secondary | ICD-10-CM | POA: Diagnosis not present

## 2017-05-19 DIAGNOSIS — G4733 Obstructive sleep apnea (adult) (pediatric): Secondary | ICD-10-CM | POA: Diagnosis not present

## 2017-05-19 DIAGNOSIS — Z95810 Presence of automatic (implantable) cardiac defibrillator: Secondary | ICD-10-CM | POA: Insufficient documentation

## 2017-05-19 LAB — DIGOXIN LEVEL: Digoxin Level: 0.9 ng/mL (ref 0.8–2.0)

## 2017-05-19 NOTE — H&P (Signed)
HISTORY AND PHYSICAL  [name] is a 51 y.o. female patient with CC: painful teeth.  No diagnosis found.  Past Medical History:  Diagnosis Date  . Allergic rhinitis   . Arthritis    "hips, back, legs, arms" (07/04/2014)  . Asthma    hx  . Automatic implantable cardioverter-defibrillator in situ   . Calcifying tendinitis of shoulder   . Chronic combined systolic and diastolic CHF (congestive heart failure) ([place])    EF 40-45% by echo 12/06/2012  . Chronic diastolic heart failure (H[facility])     Primarily diastolic CHF: Likely due to uncontrolled HTN. Last echo (8/12) with EF 45-50%, mild to moderate LVH with some asymmetric septal hypertrophy, RV normal size and systolic function. EF 50-55% by LV-gram in 6/12.   . Chronic lower back pain    secondary to DJD, obsetiy, hip problems. Followed by Dr. Oval Linsey (pain management)  . Coronary artery disease    questionable. LHC 05/2011 showing normal coronaries // Followed at [facility], Dr. Aundra Dubin  . Degeneration of lumbar or lumbosacral intervertebral disc   . DJD (degenerative joint disease) of hip    right sided  . Frequent UTI   . GERD (gastroesophageal reflux disease)   . HLD (hyperlipidemia)   . Hypertension    Poorly controlled. Has had HTN since age 58. Angioedema with ACEI.  24 Hr urine and renal arterial dopplers ordered . . . Never done  . LBBB (left bundle branch block)   . Liver disease   . Morbid obesity ([place])   . NICM (nonischemic cardiomyopathy) ([place])    EF 45-50% in 8/12, cath 6/12 showed normal coronaries, EF 50-55% by LV gram  . OSA on CPAP    sleep study in 8/12 showed moderate to severe OSA requiring CPAP  . Polyneuropathy in diabetes(357.2)   . Presence of permanent cardiac pacemaker   . Seizures ([place])    last 3 months  . Shortness of breath    none now  . Stroke [facility]) 12/2013   "my left side is paralyzed" (07/04/2014)  . Thoracic or lumbosacral neuritis or radiculitis, unspecified   . Type II  diabetes mellitus ([place]) DX: 2002    No current facility-administered medications for this encounter.    Current Outpatient Prescriptions  Medication Sig Dispense Refill  . amLODipine (NORVASC) 10 MG tablet TAKE 1 TABLET BY MOUTH EVERY DAY 90 tablet 3  . atorvastatin (LIPITOR) 40 MG tablet TAKE ONE TABLET BY MOUTH ONCE DAILY 30 tablet 6  . calamine lotion Apply 1 application topically as needed for itching. 120 mL 0  . diclofenac sodium (VOLTAREN) 1 % GEL APPLY 2 GM TOPICALLY FOUR TIMES DAILY AS NEEDED FOR PAIN 300 g 2  . DIGOX 125 MCG tablet TAKE ONE TABLET BY MOUTH EVERY DAY 30 tablet 11  . fluticasone (CUTIVATE) 0.05 % cream Apply topically 2 (two) times daily. Dispense two tubes 30 g 6  . furosemide (LASIX) 40 MG tablet TAKE 1 TABLET BY MOUTH TWICE A WEEK 10 tablet 3  . gabapentin (NEURONTIN) 300 MG capsule Take 1 capsule (300 mg total) by mouth 2 (two) times daily. 60 capsule 2  . hydrALAZINE (APRESOLINE) 50 MG tablet TAKE 1 & 1/2 TABLETS BY MOUTH 3 TIMES A DAY 405 tablet 3  . hydrocortisone cream 1 % Apply to itchy skin 2 times daily 30 g 1  . Insulin Glargine (LANTUS SOLOSTAR) 100 UNIT/ML Solostar Pen Inject 52 Units into the skin daily at 10 pm. 30  mL 6  . isosorbide mononitrate (IMDUR) 60 MG 24 hr tablet TAKE 1 AND 1/2 TABLET BY MOUTH DAILY 45 tablet 3  . levETIRAcetam (KEPPRA) 500 MG tablet TAKE 1 TABLET BY MOUTH TWO TIMES DAILY **MUST NOT MISS ANY DOSE** 180 tablet 1  . liver oil-zinc oxide (DESITIN) 40 % ointment Apply 1 application topically as needed for irritation. To skin folds 56.7 g 3  . metoprolol succinate (TOPROL-XL) 100 MG 24 hr tablet TAKE 1 TABLET BY MOUTH TWO TIMES DAILY 60 tablet 2  . mupirocin cream (BACTROBAN) 2 % Apply 1 application topically 2 (two) times daily. 15 g 2  . nystatin (MYCOSTATIN) 100000 UNIT/ML suspension 5 ml daily - swish in mouth and then spit out 60 mL 0  . nystatin (NYSTATIN) powder Apply to affected area twice daily until healed. 30 g 2  .  nystatin cream (MYCOSTATIN) APPLY UNDER ABDOMINAL SKIN AND ON BUTTOCKS 2 TIMES A DAY UNTIL RASH RESOLVES 30 g 2  . ondansetron (ZOFRAN ODT) 4 MG disintegrating tablet Take 1 tablet (4 mg total) by mouth every 8 (eight) hours as needed for nausea or vomiting. (Patient not taking: Reported on 05/19/2017) 20 tablet 0  . oxyCODONE-acetaminophen (PERCOCET) 7.5-325 MG tablet Take 1 tablet by mouth every 8 (eight) hours as needed. (Patient not taking: Reported on 05/19/2017) 90 tablet 0  . pantoprazole (PROTONIX) 40 MG tablet Take 1 tablet (40 mg total) by mouth daily. 90 tablet 1  . penicillin v potassium (VEETID) 500 MG tablet Take 1 tablet (500 mg total) by mouth 4 (four) times daily. 40 tablet 0  . polyethylene glycol (MIRALAX / GLYCOLAX) packet MIX 1 PACKET IN 8OZ OF LIQUID EVERY DAY 14 each 0  . rivaroxaban (XARELTO) 20 MG TABS tablet Take 1 tablet (20 mg total) by mouth daily with supper. 30 tablet 3  . solifenacin (VESICARE) 10 MG tablet Take 10 mg by mouth daily.    [name] spironolactone (ALDACTONE) 50 MG tablet Take 1 tablet (50 mg total) by mouth daily. 90 tablet 1  . Vitamin D, Ergocalciferol, (DRISDOL) 50000 units CAPS capsule TAKE ONE CAPSULE BY MOUTH EVERY MONDAY 4 capsule 0  . ACCU-CHEK FASTCLIX LANCETS MISC Check blood sugar 3 times a day 102 each 12  . ACCU-CHEK SMARTVIEW test strip USE TO CHECK BLOOD SUGAR THREE TIMES DAILY 100 each 12  . Blood Glucose Monitoring Suppl (ACCU-CHEK GUIDE) w/Device KIT 1 each by Does not apply route 3 (three) times daily. 1 kit 1  . cyclobenzaprine (FLEXERIL) 10 MG tablet Take 10 mg by mouth 3 (three) times daily as needed for muscle spasms.    . metFORMIN (GLUCOPHAGE-XR) 500 MG 24 hr tablet Take 500 mg by mouth 2 (two) times daily.    [name] senna-docusate (SB DOCUSATE SODIUM/SENNA) 8.6-50 MG tablet Take 1 tablet by mouth at bedtime as needed for mild constipation.    [name] UNIFINE PENTIPS 31G X 5 MM MISC USE TO INJECT INSULIN TWO TIMES DAILY 100 each 0   Allergies   Allergen Reactions  . Ace Inhibitors Anaphylaxis    Swelling of the tongue and throat   . Clindamycin Anaphylaxis and Swelling  . Enalapril Maleate Anaphylaxis    Swelling of the tongue and throat   . Lisinopril Anaphylaxis    Swelling of the tongue and throat    Active Problems:   * No active hospital problems. *  Vitals: Last menstrual period 05/10/2017. Lab results:No results found. However, due to the size of the patient record,  not all encounters were searched. Please check Results Review for a complete set of results. Radiology Results: No results found. General appearance: alert, cooperative and moderately obese Head: Normocephalic, without obvious abnormality, atraumatic Eyes: negative Nose: Nares normal. Septum midline. Mucosa normal. No drainage or sinus tenderness. Throat: lips, mucosa, and tongue normal; teeth and gums normal and Severe dental caries teeth 20-29. Maxilla edentulous. Pharynx clear. Neck: no adenopathy, supple, symmetrical, trachea midline and thyroid not enlarged, symmetric, no tenderness/mass/nodules Resp: clear to auscultation bilaterally Cardio: regular rate and rhythm, S1, S2 normal, no murmur, click, rub or gallop  Assessment: nonrestorable teeth secondary to dental caries  Plan: multiple dental extractions with alveoloplasty. GA. Day surgery.   Ashey Tramontana M 05/19/2017

## 2017-05-19 NOTE — Progress Notes (Signed)
Advanced Heart Failure Medication Review by a Pharmacist  Does the patient  feel that his/her medications are working for him/her?  yes  Has the patient been experiencing any side effects to the medications prescribed?  yes  Does the patient measure his/her own blood pressure or blood glucose at home?  yes   Does the patient have any problems obtaining medications due to transportation or finances?   no  Understanding of regimen: fair Understanding of indications: fair Potential of compliance: good Patient understands to avoid NSAIDs. Patient understands to avoid decongestants.  Issues to address at subsequent visits: None   Pharmacist comments:  Ms. Chenoweth is a pleasant 51 yo F presenting with her medication bottles and pillbox. She reports good compliance with her regimen and is enrolled in the paramedicine program so Geraldine Contras has been filling her pillbox for her weekly. She did state that after she was discharged from the hospital, the first time she took her medications in the morning she did not feel well. She did not have any other medication-related questions or concerns for me at this time.   Tyler Deis. Bonnye Fava, PharmD, BCPS, CPP Clinical Pharmacist Pager: [phone] Phone: [phone] 05/19/2017 10:29 AM      Time with patient: 12 minutes Preparation and documentation time: 2 minutes Total time: 14 minutes

## 2017-05-19 NOTE — Patient Instructions (Signed)
Labs today (will call for abnormal results, otherwise no news is good news)  Follow up in 3 months, we will contact you to schedule this appointment.   Stroke occurred January 2015 for your records.

## 2017-05-19 NOTE — Telephone Encounter (Signed)
Following up to provide diabetes self management support:

## 2017-05-19 NOTE — Anesthesia Preprocedure Evaluation (Addendum)
Anesthesia Evaluation  Patient identified by MRN, date of birth, ID band Patient awake    Reviewed: Allergy & Precautions, H&P , NPO status , Patient's Chart, lab work & pertinent test results  Airway Mallampati: III  TM Distance: >3 FB Neck ROM: Full    Dental no notable dental hx. (+) Poor Dentition, Dental Advisory Given   Pulmonary asthma , sleep apnea and Continuous Positive Airway Pressure Ventilation ,    Pulmonary exam normal breath sounds clear to auscultation       Cardiovascular Exercise Tolerance: Good hypertension, Pt. on medications and Pt. on home beta blockers +CHF  + dysrhythmias + pacemaker + Cardiac Defibrillator  Rhythm:Regular Rate:Normal     Neuro/Psych Seizures -, Well Controlled,  Depression CVA, Residual Symptoms negative psych ROS   GI/Hepatic Neg liver ROS, GERD  Medicated and Controlled,  Endo/Other  diabetes, Insulin Dependent  Renal/GU negative Renal ROS  negative genitourinary   Musculoskeletal  (+) Arthritis , Osteoarthritis,    Abdominal   Peds  Hematology negative hematology ROS (+)   Anesthesia Other Findings   Reproductive/Obstetrics negative OB ROS                           Anesthesia Physical Anesthesia Plan  ASA: III  Anesthesia Plan: General   Post-op Pain Management:    Induction: Intravenous  PONV Risk Score and Plan: 4 or greater and Ondansetron, Dexamethasone, Propofol and Midazolam  Airway Management Planned: Nasal ETT and Video Laryngoscope Planned  Additional Equipment:   Intra-op Plan:   Post-operative Plan: Extubation in OR  Informed Consent: I have reviewed the patients History and Physical, chart, labs and discussed the procedure including the risks, benefits and alternatives for the proposed anesthesia with the patient or authorized representative who has indicated his/her understanding and acceptance.   Dental advisory  given  Plan Discussed with: CRNA  Anesthesia Plan Comments:        Anesthesia Quick Evaluation

## 2017-05-19 NOTE — Addendum Note (Signed)
Encounter addended by: Little Ishikawa, NP on: 05/19/2017 11:24 AM<BR>    Actions taken: Sign clinical note

## 2017-05-19 NOTE — Progress Notes (Addendum)
Patient ID: [name], female   DOB: 12-24-1965, 51 y.o.   MRN: [id number]    Advanced Heart Failure Clinic Note   EP: Dr Caryl Comes PCP: Dr [facility] Primary HF Cardiologist: Dr Aundra Dubin    HPI: 51 yo with history of morbid obesity, HTN, HLD, CAD, DM2, OSA on CPAP, CVA and chronic systolic CHF in the setting of poorly-controlled HTN. She was admitted in 05/2011 with chest pain and new LBBB and ended up getting a left heart cath, which showed no angiographic CAD. Renal artery dopplers were normal in 04/2012.  She has a Medtronic CRT-D device.   Was hospitalized May 2014 with acute on chronic combined systolic and diastolic CHF. EF from December 2013 was down to 40 to 45%.   Admitted in January 2015 with slurred speech, L arm weakness and facial droop. Initial head CT showed no acute abnormality. Repeat head CT showed subtle gray white matter differentiation in R frontal lobe and R operculum perhaps representing ischemic R sided MCA. STAT repeat CT head later on 1/29 showed further evolution of R MCA territory infarct involving R insular cortex and operculum. No hemorrhage. Neurology recommended CTA head/neck, which showed occlusion of MCA branch to the area of R MCA infarct and a normal neck CTA. TEE was done and did not reveal embolic source. Was discharged to inpatient rehab.  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  She was started on Keppra.  She went to the ER in 2/16 with syncope that was probably due to seizure, no arrhythmias with device interrogation.  Echo in 11/16 showed EF back down to 25-30%.    She presents today for HF follow up. Feeling well overall, no SOB with walking into clinic, no SOB with ADL's. She is suffering from dental pain, went to the ED last week. She needs to have some teeth extracted. Taking all medications. Weight at home 232-235 pounds. Denies orthopnea, PND, chest pain and palpitations.   Device  interrogation: Volume status dry, thoracic impedance above threshold.   ECHO 11/2012: 40-45% ECHO  07/2013: 20% RV normal ECHO    08/23/2013: EF 40-45%, diff HK ECHO   09/05/2013: EF 25-30%, diff HK  ECHO  12/11/13: EF 30%, diff HK, RV normal ECHO 01/14/2014: EF 35-40%, LA mod dilated  ECHO 4/16: EF 40-45%, moderate LVH, normal RV size with mildly decreased systolic function.   ECHO 11/16: EF 25-30%, moderate LVH ECHO 2/18: EF 45%, moderate LVH, normal RV size and systolic function.   Labs 06/2013: proBNP 230, Cr 0.9, K 3.7          08/2013: K+ 3.9, Cr 0.9       09/2013: K 4.2, Cr 0.86, pro-BNP 718, dig level <0.3     10/07/13: K+ 4.1, Cr 0.83       05/08/14: K 4.0 Creatinine 0.67 Pro BNP 233 Dig level 0.3        08/20/2014 K 3.8 nCreatinine 0.56        2/16: K 3.6, creatinine 0.95, LDL 106, HDL 43       3/16: K 3.5, creatinine 0.87, HCT 35.9       7/16: K 3.5, creatinine 0.78. HCT 37       10/16: digoxin 0.2, LDL 116       1/17: K 3.6, creatinine 0.77, HCT 38       5/17: digoxin 0.9       8/17: K 4.2, creatinine 0.68,  HCT 35.7       11/17: K 3.8, creatinine 0.76  ROS: All systems negative except as listed in HPI, PMH and Problem List.  Past Medical History:  Diagnosis Date  . Allergic rhinitis   . Arthritis    "hips, back, legs, arms" (07/04/2014)  . Asthma    hx  . Automatic implantable cardioverter-defibrillator in situ   . Calcifying tendinitis of shoulder   . Chronic combined systolic and diastolic CHF (congestive heart failure) ([place])    EF 40-45% by echo 12/06/2012  . Chronic diastolic heart failure ([facility])     Primarily diastolic CHF: Likely due to uncontrolled HTN. Last echo (8/12) with EF 45-50%, mild to moderate LVH with some asymmetric septal hypertrophy, RV normal size and systolic function. EF 50-55% by LV-gram in 6/12.   . Chronic lower back pain    secondary to DJD, obsetiy, hip problems. Followed by Dr. Oval Linsey (pain management)  . Coronary artery disease     questionable. LHC 05/2011 showing normal coronaries // Followed at [facility], Dr. Aundra Dubin  . Degeneration of lumbar or lumbosacral intervertebral disc   . DJD (degenerative joint disease) of hip    right sided  . Frequent UTI   . GERD (gastroesophageal reflux disease)   . HLD (hyperlipidemia)   . Hypertension    Poorly controlled. Has had HTN since age 44. Angioedema with ACEI.  24 Hr urine and renal arterial dopplers ordered . . . Never done  . LBBB (left bundle branch block)   . Liver disease   . Morbid obesity ([place])   . NICM (nonischemic cardiomyopathy) ([place])    EF 45-50% in 8/12, cath 6/12 showed normal coronaries, EF 50-55% by LV gram  . OSA on CPAP    sleep study in 8/12 showed moderate to severe OSA requiring CPAP  . Polyneuropathy in diabetes(357.2)   . Presence of permanent cardiac pacemaker   . Seizures ([place])    last 3 months  . Shortness of breath    none now  . Stroke [facility]) 12/2013   "my left side is paralyzed" (07/04/2014)  . Thoracic or lumbosacral neuritis or radiculitis, unspecified   . Type II diabetes mellitus ([place]) DX: 2002    Current Outpatient Prescriptions  Medication Sig Dispense Refill  . ACCU-CHEK FASTCLIX LANCETS MISC Check blood sugar 3 times a day 102 each 12  . ACCU-CHEK SMARTVIEW test strip USE TO CHECK BLOOD SUGAR THREE TIMES DAILY 100 each 12  . amLODipine (NORVASC) 10 MG tablet TAKE 1 TABLET BY MOUTH EVERY DAY 90 tablet 3  . atorvastatin (LIPITOR) 40 MG tablet TAKE ONE TABLET BY MOUTH ONCE DAILY 30 tablet 6  . Blood Glucose Monitoring Suppl (ACCU-CHEK GUIDE) w/Device KIT 1 each by Does not apply route 3 (three) times daily. 1 kit 1  . calamine lotion Apply 1 application topically as needed for itching. 120 mL 0  . cyclobenzaprine (FLEXERIL) 10 MG tablet Take 10 mg by mouth 3 (three) times daily as needed for muscle spasms.    . diclofenac sodium (VOLTAREN) 1 % GEL APPLY 2 GM TOPICALLY FOUR TIMES DAILY AS NEEDED FOR PAIN 300 g 2  . DIGOX 125  MCG tablet TAKE ONE TABLET BY MOUTH EVERY DAY 30 tablet 11  . fluticasone (CUTIVATE) 0.05 % cream Apply topically 2 (two) times daily. Dispense two tubes 30 g 6  . furosemide (LASIX) 40 MG tablet TAKE 1 TABLET BY MOUTH TWICE A WEEK 10 tablet 3  . gabapentin (  NEURONTIN) 300 MG capsule Take 1 capsule (300 mg total) by mouth 2 (two) times daily. 60 capsule 2  . hydrALAZINE (APRESOLINE) 50 MG tablet TAKE 1 & 1/2 TABLETS BY MOUTH 3 TIMES A DAY 405 tablet 3  . hydrocortisone cream 1 % Apply to itchy skin 2 times daily 30 g 1  . Insulin Glargine (LANTUS SOLOSTAR) 100 UNIT/ML Solostar Pen Inject 52 Units into the skin daily at 10 pm. 30 mL 6  . isosorbide mononitrate (IMDUR) 60 MG 24 hr tablet TAKE 1 AND 1/2 TABLET BY MOUTH DAILY 45 tablet 3  . levETIRAcetam (KEPPRA) 500 MG tablet TAKE 1 TABLET BY MOUTH TWO TIMES DAILY **MUST NOT MISS ANY DOSE** 180 tablet 1  . liver oil-zinc oxide (DESITIN) 40 % ointment Apply 1 application topically as needed for irritation. To skin folds 56.7 g 3  . metFORMIN (GLUCOPHAGE-XR) 500 MG 24 hr tablet Take 500 mg by mouth 2 (two) times daily.    . metoprolol succinate (TOPROL-XL) 100 MG 24 hr tablet TAKE 1 TABLET BY MOUTH TWO TIMES DAILY 60 tablet 2  . mupirocin cream (BACTROBAN) 2 % Apply 1 application topically 2 (two) times daily. 15 g 2  . nystatin (MYCOSTATIN) 100000 UNIT/ML suspension 5 ml daily - swish in mouth and then spit out 60 mL 0  . nystatin (NYSTATIN) powder Apply to affected area twice daily until healed. 30 g 2  . nystatin cream (MYCOSTATIN) APPLY UNDER ABDOMINAL SKIN AND ON BUTTOCKS 2 TIMES A DAY UNTIL RASH RESOLVES 30 g 2  . pantoprazole (PROTONIX) 40 MG tablet Take 1 tablet (40 mg total) by mouth daily. 90 tablet 1  . penicillin v potassium (VEETID) 500 MG tablet Take 1 tablet (500 mg total) by mouth 4 (four) times daily. 40 tablet 0  . polyethylene glycol (MIRALAX / GLYCOLAX) packet MIX 1 PACKET IN 8OZ OF LIQUID EVERY DAY 14 each 0  . rivaroxaban  (XARELTO) 20 MG TABS tablet Take 1 tablet (20 mg total) by mouth daily with supper. 30 tablet 3  . senna-docusate (SB DOCUSATE SODIUM/SENNA) 8.6-50 MG tablet Take 1 tablet by mouth at bedtime as needed for mild constipation.    . solifenacin (VESICARE) 10 MG tablet Take 10 mg by mouth daily.    [name] spironolactone (ALDACTONE) 50 MG tablet Take 1 tablet (50 mg total) by mouth daily. 90 tablet 1  . UNIFINE PENTIPS 31G X 5 MM MISC USE TO INJECT INSULIN TWO TIMES DAILY 100 each 0  . Vitamin D, Ergocalciferol, (DRISDOL) 50000 units CAPS capsule TAKE ONE CAPSULE BY MOUTH EVERY MONDAY 4 capsule 0  . ondansetron (ZOFRAN ODT) 4 MG disintegrating tablet Take 1 tablet (4 mg total) by mouth every 8 (eight) hours as needed for nausea or vomiting. (Patient not taking: Reported on 05/19/2017) 20 tablet 0  . oxyCODONE-acetaminophen (PERCOCET) 7.5-325 MG tablet Take 1 tablet by mouth every 8 (eight) hours as needed. (Patient not taking: Reported on 05/19/2017) 90 tablet 0   No current facility-administered medications for this encounter.    Vitals:   05/19/17 1043  BP: (!) 166/110  Pulse: (!) 114  SpO2: 97%  Weight: 232 lb (105.2 kg)    Wt Readings from Last 3 Encounters:  05/19/17 232 lb (105.2 kg)  05/10/17 241 lb (109.3 kg)  05/03/17 241 lb (109.3 kg)     PHYSICAL EXAM: General:  NAD. No resp difficulty; walked in to clinic with cane.   Neck: supple. No JVP.  Carotids 2+ bilaterally; no bruits.  Thyroid symmetrically enlarged.  Cor: PMI nonpalpable; Regular rate and rhythm. No rubs, gallops or murmurs. No S3, S4.  Lungs: Clear bilaterally, normal effort.  Abdomen: obese, soft, NT, ND, no HSM. No bruits or masses. Bowel sounds present in all quadrants.  Extremities: no cyanosis, clubbing, rash. No peripheral edema.  Neuro: alert & orientedx3, cranial nerves grossly intact. Moves all 4 extremities w/o difficulty. Affect pleasant.  ASSESSMENT/PLAN: 1. Chronic systolic HF: Nonischemic cardiomyopathy, normal  coronaries on 6/12 angiography.  Cardiomyopathy may be due to long-standing and poorly-controlled HTN.  Echo 11/16 EF 25-30%, echo 2/18 with EF improved to 45%.  S/P CRT-D Medtronic.   - NYHA class II-III symptoms - Volume status stable on exam.  - Continue Lasix 2 times a week.  - Continue current spironolactone, digoxin, and Toprol XL.  - Check Digoxin level today.  - Continue hydralazine 75 mg tid and Imdur 90 mg daily.   - No ACE/ARB/ARNI due to angioedema.  2. HTN:  - BP elevated today but she just took her meds about 15 minutes prior to her appointment.  3. Morbid obesity:  - Counseled about portion size and low sodium foods.    4. OSA on CPAP:   - Continue CPAP hs.  5. Atrial fibrillation: Episode of atrial fibrillation in 11/17 noted on device interrogation. Patient had a CVA in the past.  CHADSVASC = 5.  - Continue Xarelto.  - Denies hematochezia, melena.   6. Surgical clearance - patient ok to have dental extraction.  - Recent EKG non ischemic.  - Xarelto on hold for surgery. Resume when ok'd by dental surgery.   Follow up in 3 months.   Arbutus Leas  05/19/2017

## 2017-05-20 ENCOUNTER — Encounter: Payer: Self-pay | Admitting: *Deleted

## 2017-05-20 ENCOUNTER — Ambulatory Visit (HOSPITAL_COMMUNITY): Payer: Medicaid Other | Admitting: Emergency Medicine

## 2017-05-20 ENCOUNTER — Ambulatory Visit (HOSPITAL_COMMUNITY)
Admission: RE | Admit: 2017-05-20 | Discharge: 2017-05-20 | Disposition: A | Discharge status: Home / Self Care | Payer: Medicaid Other | Source: Ambulatory Visit | Attending: Oral Surgery | Admitting: Oral Surgery

## 2017-05-20 ENCOUNTER — Encounter (HOSPITAL_COMMUNITY)
Admission: RE | Disposition: A | Discharge status: Home / Self Care | Payer: Self-pay | Source: Ambulatory Visit | Attending: Oral Surgery

## 2017-05-20 ENCOUNTER — Ambulatory Visit (HOSPITAL_COMMUNITY): Payer: Medicaid Other | Admitting: Anesthesiology

## 2017-05-20 ENCOUNTER — Encounter (HOSPITAL_COMMUNITY): Payer: Self-pay

## 2017-05-20 DIAGNOSIS — Z6834 Body mass index (BMI) 34.0-34.9, adult: Secondary | ICD-10-CM | POA: Insufficient documentation

## 2017-05-20 DIAGNOSIS — Z8673 Personal history of transient ischemic attack (TIA), and cerebral infarction without residual deficits: Secondary | ICD-10-CM | POA: Insufficient documentation

## 2017-05-20 DIAGNOSIS — M4690 Unspecified inflammatory spondylopathy, site unspecified: Secondary | ICD-10-CM | POA: Insufficient documentation

## 2017-05-20 DIAGNOSIS — Z7901 Long term (current) use of anticoagulants: Secondary | ICD-10-CM | POA: Insufficient documentation

## 2017-05-20 DIAGNOSIS — I5042 Chronic combined systolic (congestive) and diastolic (congestive) heart failure: Secondary | ICD-10-CM | POA: Diagnosis not present

## 2017-05-20 DIAGNOSIS — J45909 Unspecified asthma, uncomplicated: Secondary | ICD-10-CM | POA: Insufficient documentation

## 2017-05-20 DIAGNOSIS — I429 Cardiomyopathy, unspecified: Secondary | ICD-10-CM | POA: Insufficient documentation

## 2017-05-20 DIAGNOSIS — Z95810 Presence of automatic (implantable) cardiac defibrillator: Secondary | ICD-10-CM | POA: Diagnosis not present

## 2017-05-20 DIAGNOSIS — Z794 Long term (current) use of insulin: Secondary | ICD-10-CM | POA: Diagnosis not present

## 2017-05-20 DIAGNOSIS — E1142 Type 2 diabetes mellitus with diabetic polyneuropathy: Secondary | ICD-10-CM | POA: Insufficient documentation

## 2017-05-20 DIAGNOSIS — K769 Liver disease, unspecified: Secondary | ICD-10-CM | POA: Diagnosis not present

## 2017-05-20 DIAGNOSIS — K219 Gastro-esophageal reflux disease without esophagitis: Secondary | ICD-10-CM | POA: Diagnosis not present

## 2017-05-20 DIAGNOSIS — Z79899 Other long term (current) drug therapy: Secondary | ICD-10-CM | POA: Diagnosis not present

## 2017-05-20 DIAGNOSIS — I110 Hypertensive heart disease with heart failure: Secondary | ICD-10-CM | POA: Diagnosis not present

## 2017-05-20 DIAGNOSIS — Z881 Allergy status to other antibiotic agents status: Secondary | ICD-10-CM | POA: Insufficient documentation

## 2017-05-20 DIAGNOSIS — E785 Hyperlipidemia, unspecified: Secondary | ICD-10-CM | POA: Diagnosis not present

## 2017-05-20 DIAGNOSIS — J309 Allergic rhinitis, unspecified: Secondary | ICD-10-CM | POA: Diagnosis not present

## 2017-05-20 DIAGNOSIS — G8929 Other chronic pain: Secondary | ICD-10-CM | POA: Diagnosis not present

## 2017-05-20 DIAGNOSIS — K029 Dental caries, unspecified: Secondary | ICD-10-CM | POA: Diagnosis present

## 2017-05-20 DIAGNOSIS — G4733 Obstructive sleep apnea (adult) (pediatric): Secondary | ICD-10-CM | POA: Insufficient documentation

## 2017-05-20 DIAGNOSIS — M160 Bilateral primary osteoarthritis of hip: Secondary | ICD-10-CM | POA: Insufficient documentation

## 2017-05-20 DIAGNOSIS — Z888 Allergy status to other drugs, medicaments and biological substances status: Secondary | ICD-10-CM | POA: Insufficient documentation

## 2017-05-20 DIAGNOSIS — M545 Low back pain: Secondary | ICD-10-CM | POA: Insufficient documentation

## 2017-05-20 DIAGNOSIS — I2510 Atherosclerotic heart disease of native coronary artery without angina pectoris: Secondary | ICD-10-CM | POA: Insufficient documentation

## 2017-05-20 DIAGNOSIS — M5136 Other intervertebral disc degeneration, lumbar region: Secondary | ICD-10-CM | POA: Diagnosis not present

## 2017-05-20 DIAGNOSIS — M1990 Unspecified osteoarthritis, unspecified site: Secondary | ICD-10-CM | POA: Insufficient documentation

## 2017-05-20 HISTORY — PX: MULTIPLE EXTRACTIONS WITH ALVEOLOPLASTY: SHX5342

## 2017-05-20 LAB — GLUCOSE, CAPILLARY
Glucose-Capillary: 198 mg/dL — ABNORMAL HIGH (ref 65–99)
Glucose-Capillary: 181 mg/dL — ABNORMAL HIGH (ref 65–99)

## 2017-05-20 SURGERY — MULTIPLE EXTRACTION WITH ALVEOLOPLASTY
Anesthesia: General | Site: Mouth | Laterality: Bilateral

## 2017-05-20 MED ORDER — 0.9 % SODIUM CHLORIDE (POUR BTL) OPTIME
TOPICAL | Status: DC | PRN
  Administered 2017-05-20: 1000 mL

## 2017-05-20 MED ORDER — LIDOCAINE-EPINEPHRINE 2 %-1:100000 IJ SOLN
INTRAMUSCULAR | Status: AC
  Filled 2017-05-20: qty 1

## 2017-05-20 MED ORDER — LACTATED RINGERS IV SOLN
INTRAVENOUS | Status: DC
  Administered 2017-05-20: 09:00:00 via INTRAVENOUS

## 2017-05-20 MED ORDER — FENTANYL CITRATE (PF) 100 MCG/2ML IJ SOLN
INTRAMUSCULAR | Status: DC | PRN
  Administered 2017-05-20: 100 ug via INTRAVENOUS

## 2017-05-20 MED ORDER — CEFAZOLIN SODIUM-DEXTROSE 2-3 GM-% IV SOLR
INTRAVENOUS | Status: DC | PRN
  Administered 2017-05-20: 2 g via INTRAVENOUS

## 2017-05-20 MED ORDER — SODIUM CHLORIDE 0.9 % IR SOLN
Status: DC | PRN
  Administered 2017-05-20: 1000 mL

## 2017-05-20 MED ORDER — ONDANSETRON HCL 4 MG/2ML IJ SOLN
INTRAMUSCULAR | Status: DC | PRN
  Administered 2017-05-20: 4 mg via INTRAVENOUS

## 2017-05-20 MED ORDER — HYDROMORPHONE HCL 1 MG/ML IJ SOLN
0.2500 mg | INTRAMUSCULAR | Status: DC | PRN
  Administered 2017-05-20 (×2): 0.5 mg via INTRAVENOUS

## 2017-05-20 MED ORDER — PROPOFOL 10 MG/ML IV BOLUS
INTRAVENOUS | Status: DC | PRN
  Administered 2017-05-20: 100 mg via INTRAVENOUS

## 2017-05-20 MED ORDER — CEFAZOLIN SODIUM-DEXTROSE 2-4 GM/100ML-% IV SOLN
INTRAVENOUS | Status: AC
  Filled 2017-05-20: qty 100

## 2017-05-20 MED ORDER — AMOXICILLIN 500 MG PO CAPS: 500.0000 mg | ORAL_CAPSULE | Freq: Three times a day (TID) | ORAL | 0 refills | Status: DC

## 2017-05-20 MED ORDER — SUCCINYLCHOLINE CHLORIDE 200 MG/10ML IV SOSY
PREFILLED_SYRINGE | INTRAVENOUS | Status: DC | PRN
  Administered 2017-05-20: 100 mg via INTRAVENOUS

## 2017-05-20 MED ORDER — OXYMETAZOLINE HCL 0.05 % NA SOLN
NASAL | Status: AC
  Filled 2017-05-20: qty 15

## 2017-05-20 MED ORDER — PROPOFOL 10 MG/ML IV BOLUS
INTRAVENOUS | Status: AC
  Filled 2017-05-20: qty 20

## 2017-05-20 MED ORDER — LIDOCAINE-EPINEPHRINE 2 %-1:100000 IJ SOLN
INTRAMUSCULAR | Status: DC | PRN
  Administered 2017-05-20: 10 mL

## 2017-05-20 MED ORDER — LIDOCAINE 2% (20 MG/ML) 5 ML SYRINGE
INTRAMUSCULAR | Status: AC
  Filled 2017-05-20: qty 5

## 2017-05-20 MED ORDER — OXYMETAZOLINE HCL 0.05 % NA SOLN
NASAL | Status: DC | PRN
  Administered 2017-05-20 (×2): 2 via NASAL

## 2017-05-20 MED ORDER — OXYCODONE-ACETAMINOPHEN 5-325 MG PO TABS
1.0000 | ORAL_TABLET | Freq: Once | ORAL | Status: AC
  Administered 2017-05-20: 2 via ORAL

## 2017-05-20 MED ORDER — GLYCOPYRROLATE 0.2 MG/ML IV SOSY
PREFILLED_SYRINGE | INTRAVENOUS | Status: DC | PRN
  Administered 2017-05-20: .1 mg via INTRAVENOUS

## 2017-05-20 MED ORDER — ROCURONIUM BROMIDE 10 MG/ML (PF) SYRINGE
PREFILLED_SYRINGE | INTRAVENOUS | Status: AC
  Filled 2017-05-20: qty 5

## 2017-05-20 MED ORDER — MIDAZOLAM HCL 2 MG/2ML IJ SOLN
INTRAMUSCULAR | Status: AC
  Filled 2017-05-20: qty 2

## 2017-05-20 MED ORDER — HYDROMORPHONE HCL 1 MG/ML IJ SOLN
INTRAMUSCULAR | Status: AC
  Administered 2017-05-20: 0.5 mg via INTRAVENOUS
  Filled 2017-05-20: qty 0.5

## 2017-05-20 MED ORDER — LIDOCAINE 2% (20 MG/ML) 5 ML SYRINGE
INTRAMUSCULAR | Status: DC | PRN
  Administered 2017-05-20: 60 mg via INTRAVENOUS

## 2017-05-20 MED ORDER — ONDANSETRON HCL 4 MG/2ML IJ SOLN
INTRAMUSCULAR | Status: AC
  Filled 2017-05-20: qty 2

## 2017-05-20 MED ORDER — MIDAZOLAM HCL 5 MG/5ML IJ SOLN
INTRAMUSCULAR | Status: DC | PRN
Start: 2017-05-20 — End: 2017-05-20
  Administered 2017-05-20: 2 mg via INTRAVENOUS

## 2017-05-20 MED ORDER — PHENYLEPHRINE 40 MCG/ML (10ML) SYRINGE FOR IV PUSH (FOR BLOOD PRESSURE SUPPORT)
PREFILLED_SYRINGE | INTRAVENOUS | Status: AC
  Filled 2017-05-20: qty 10

## 2017-05-20 MED ORDER — OXYCODONE-ACETAMINOPHEN 5-325 MG PO TABS: 1.0000 | ORAL_TABLET | ORAL | 0 refills | Status: DC | PRN

## 2017-05-20 MED ORDER — EPHEDRINE 5 MG/ML INJ
INTRAVENOUS | Status: AC
  Filled 2017-05-20: qty 10

## 2017-05-20 MED ORDER — HYDROMORPHONE HCL 1 MG/ML IJ SOLN
INTRAMUSCULAR | Status: AC
  Filled 2017-05-20: qty 0.5

## 2017-05-20 MED ORDER — FENTANYL CITRATE (PF) 250 MCG/5ML IJ SOLN
INTRAMUSCULAR | Status: AC
  Filled 2017-05-20: qty 5

## 2017-05-20 MED ORDER — OXYCODONE-ACETAMINOPHEN 5-325 MG PO TABS
ORAL_TABLET | ORAL | Status: DC
Start: 2017-05-20 — End: 2017-05-20
  Filled 2017-05-20: qty 2

## 2017-05-20 SURGICAL SUPPLY — 27 items
BUR CROSS CUT FISSURE 1.6 (BURR) ×2 IMPLANT
BUR EGG ELITE 4.0 (BURR) ×2 IMPLANT
CANISTER SUCT 3000ML PPV (MISCELLANEOUS) ×2 IMPLANT
COVER SURGICAL LIGHT HANDLE (MISCELLANEOUS) ×2 IMPLANT
CRADLE DONUT ADULT HEAD (MISCELLANEOUS) ×2 IMPLANT
DECANTER SPIKE VIAL GLASS SM (MISCELLANEOUS) ×2 IMPLANT
FLUID NSS /IRRIG 1000 ML XXX (MISCELLANEOUS) ×2 IMPLANT
GAUZE PACKING FOLDED 2  STR (GAUZE/BANDAGES/DRESSINGS) ×1
GAUZE PACKING FOLDED 2 STR (GAUZE/BANDAGES/DRESSINGS) ×1 IMPLANT
GLOVE BIO SURGEON STRL SZ 6.5 (GLOVE) ×2 IMPLANT
GLOVE BIO SURGEON STRL SZ7.5 (GLOVE) ×2 IMPLANT
GLOVE BIOGEL PI IND STRL 7.0 (GLOVE) ×1 IMPLANT
GLOVE BIOGEL PI INDICATOR 7.0 (GLOVE) ×1
GOWN STRL REUS W/ TWL LRG LVL3 (GOWN DISPOSABLE) ×1 IMPLANT
GOWN STRL REUS W/ TWL XL LVL3 (GOWN DISPOSABLE) ×1 IMPLANT
GOWN STRL REUS W/TWL LRG LVL3 (GOWN DISPOSABLE) ×1
GOWN STRL REUS W/TWL XL LVL3 (GOWN DISPOSABLE) ×1
KIT BASIN OR (CUSTOM PROCEDURE TRAY) ×2 IMPLANT
KIT ROOM TURNOVER OR (KITS) ×2 IMPLANT
NEEDLE 22X1 1/2 (OR ONLY) (NEEDLE) ×4 IMPLANT
NS IRRIG 1000ML POUR BTL (IV SOLUTION) ×2 IMPLANT
PAD ARMBOARD 7.5X6 YLW CONV (MISCELLANEOUS) ×2 IMPLANT
SUT CHROMIC 3 0 PS 2 (SUTURE) ×4 IMPLANT
SYR CONTROL 10ML LL (SYRINGE) ×4 IMPLANT
TRAY ENT MC OR (CUSTOM PROCEDURE TRAY) ×2 IMPLANT
TUBING IRRIGATION (MISCELLANEOUS) ×2 IMPLANT
YANKAUER SUCT BULB TIP NO VENT (SUCTIONS) ×2 IMPLANT

## 2017-05-20 NOTE — Op Note (Signed)
05/20/2017  10:37 AM  PATIENT:  [name]  51 y.o. female  PRE-OPERATIVE DIAGNOSIS:  non restorable teeth # 20, 21, 22, 23, 24, 25, 26, 27, 28, 29  POST-OPERATIVE DIAGNOSIS:  SAME  PROCEDURE:  Procedure(s): MULTIPLE EXTRACTION  teeth # 20, 21, 22, 23, 24, 25, 26, 27, 28, 29; Alveoloplasty right and left mandible  SURGEON:  Surgeon(s): [name], DDS  ANESTHESIA:   local and general  EBL:  minimal  DRAINS: none   SPECIMEN:  No Specimen  COUNTS:  YES  PLAN OF CARE: Discharge to home after PACU  PATIENT DISPOSITION:  PACU - hemodynamically stable.   PROCEDURE DETAILS: Dictation # [id number]  Georgia Lopes, DMD 05/20/2017 10:37 AM

## 2017-05-20 NOTE — Op Note (Signed)
NAME:  [name], [name]                   ACCOUNT NO.:  MEDICAL RECORD NO.:  [id number]  LOCATION:                                 FACILITY:  PHYSICIAN:  Georgia Lopes, M.D.  DATE OF BIRTH:  15-Jan-1966  DATE OF PROCEDURE:  05/20/2017 DATE OF DISCHARGE:                              OPERATIVE REPORT   PREOPERATIVE DIAGNOSIS:  Nonrestorable teeth numbers 20, 21, 22, 23, 24, 25, 26, 27, 28, 29 secondary to dental caries into pulp.  POSTOPERATIVE DIAGNOSIS:  Nonrestorable teeth numbers 20, 21, 22, 23, 24, 25, 26, 27, 28, 29 secondary to dental caries into pulp.  PROCEDURE:  Extraction of teeth numbers 20, 21, 22, 23, 24, 25, 26, 27, 28, 29 and alveoplasty right and left mandible.  SURGEON:  Georgia Lopes, M.D.  ANESTHESIA:  General, nasal intubation.  Sampson Goon, attending.  DESCRIPTION OF PROCEDURE:  The patient was taken to the operating room, placed on the table in supine position.  General anesthesia was administered intravenously and a nasal endotracheal tube was placed atraumatically and secured.  The eyes were protected.  The patient was draped for the procedure.  Time-out was performed.  The posterior pharynx was suctioned and a throat pack was placed.  Lidocaine 2% with 1:100,000 epinephrine was infiltrated in an inferior alveolar block on the right and left side and then a buccal infiltration of the anterior mandible.  Total of 10 mL was utilized.  A #15 blade was used to make an incision beginning approximately 1 cm proximal to tooth #20 along the alveolar crest and then going around all the lower teeth buccally and lingually to tooth #29 and then another 1 cm distal proximal incision was made on the right side.  The periosteum was reflected from around these teeth.  Then, the teeth were elevated with a 301 elevator and removed from the mouth with an Asch forceps.  Then, the sockets were curetted.  The gingiva was trimmed and reflected to expose the alveolar crest  and alveoplasty was then performed using the egg-shaped bur and bone file.  Then, the area was irrigated and closed with 3-0 chromic. The oral cavity was irrigated and suctioned.  Inspection was performed and found to have good contour and hemostasis and closure.  The throat pack was removed.  The patient was left in care of anesthesia for awakening and transportation to recovery room.  EBL:  Minimal.  COMPLICATIONS:  None.  SPECIMENS:  None.     Georgia Lopes, M.D.     SMJ/MEDQ  D:  05/20/2017  T:  05/20/2017  Job:  [id number]

## 2017-05-20 NOTE — Anesthesia Procedure Notes (Signed)
Procedure Name: Intubation Date/Time: 05/20/2017 10:13 AM Performed by: Rogelia Boga Pre-anesthesia Checklist: Patient identified, Emergency Drugs available, Suction available, Patient being monitored and Timeout performed Patient Re-evaluated:Patient Re-evaluated prior to inductionOxygen Delivery Method: Circle system utilized Preoxygenation: Pre-oxygenation with 100% oxygen Intubation Type: IV induction Ventilation: Mask ventilation without difficulty Laryngoscope Size: Glidescope and 4 Grade View: Grade I Nasal Tubes: Right, Nasal prep performed and Nasal Rae Tube size: 7.0 mm Number of attempts: 1 Airway Equipment and Method: Video-laryngoscopy Placement Confirmation: breath sounds checked- equal and bilateral,  positive ETCO2 and ETT inserted through vocal cords under direct vision Secured at: 25 cm Tube secured with: Tape Dental Injury: Teeth and Oropharynx as per pre-operative assessment  Comments: Afrin to bilat nares, lubricated NTT passed with slight resistance, Glidescope used.

## 2017-05-20 NOTE — Transfer of Care (Signed)
Immediate Anesthesia Transfer of Care Note  Patient: LADEIDRA BORYS  Procedure(s) Performed: Procedure(s): MULTIPLE EXTRACTION (Bilateral)  Patient Location: PACU  Anesthesia Type:General  Level of Consciousness: awake and alert   Airway & Oxygen Therapy: Patient Spontanous Breathing and Patient connected to nasal cannula oxygen  Post-op Assessment: Report given to RN and Post -op Vital signs reviewed and stable  Post vital signs: Reviewed and stable  Last Vitals:  Vitals:   05/20/17 0821 05/20/17 0836  BP:  (!) 147/99  Pulse: 92   Resp: 18   Temp: 36.9 C     Last Pain:  Vitals:   05/20/17 0836  TempSrc:   PainSc: 7       Patients Stated Pain Goal: 3 (05/20/17 0836)  Complications: No apparent anesthesia complications

## 2017-05-20 NOTE — H&P (Signed)
H&P documentation  -History and Physical Reviewed  -Patient has been re-examined  -No change in the plan of care  [name] M  

## 2017-05-20 NOTE — Anesthesia Postprocedure Evaluation (Signed)
Anesthesia Post Note  Patient: [name]  Procedure(s) Performed: Procedure(s) (LRB): MULTIPLE EXTRACTION (Bilateral)     Patient location during evaluation: PACU Anesthesia Type: General Level of consciousness: awake and alert Pain management: pain level controlled Vital Signs Assessment: post-procedure vital signs reviewed and stable Respiratory status: spontaneous breathing, nonlabored ventilation and respiratory function stable Cardiovascular status: blood pressure returned to baseline and stable Postop Assessment: no signs of nausea or vomiting Anesthetic complications: no    Last Vitals:  Vitals:   05/20/17 1155 05/20/17 1200  BP:  (!) 143/84  Pulse: 98 95  Resp: 15 11  Temp: 36.6 C     Last Pain:  Vitals:   05/20/17 1145  TempSrc:   PainSc: 5                  [name],W. EDMOND

## 2017-05-21 ENCOUNTER — Encounter (HOSPITAL_COMMUNITY): Payer: Self-pay | Admitting: Oral Surgery

## 2017-05-24 ENCOUNTER — Encounter: Payer: Medicaid Other | Attending: Physical Medicine & Rehabilitation | Admitting: Registered Nurse

## 2017-05-24 ENCOUNTER — Encounter: Payer: Self-pay | Admitting: Registered Nurse

## 2017-05-24 VITALS — BP 148/94 | HR 91

## 2017-05-24 DIAGNOSIS — M1712 Unilateral primary osteoarthritis, left knee: Secondary | ICD-10-CM | POA: Diagnosis not present

## 2017-05-24 DIAGNOSIS — G629 Polyneuropathy, unspecified: Secondary | ICD-10-CM | POA: Diagnosis present

## 2017-05-24 DIAGNOSIS — M25511 Pain in right shoulder: Secondary | ICD-10-CM | POA: Diagnosis not present

## 2017-05-24 DIAGNOSIS — M47816 Spondylosis without myelopathy or radiculopathy, lumbar region: Secondary | ICD-10-CM

## 2017-05-24 DIAGNOSIS — Z79899 Other long term (current) drug therapy: Secondary | ICD-10-CM | POA: Insufficient documentation

## 2017-05-24 DIAGNOSIS — I5042 Chronic combined systolic (congestive) and diastolic (congestive) heart failure: Secondary | ICD-10-CM | POA: Diagnosis present

## 2017-05-24 DIAGNOSIS — Z5181 Encounter for therapeutic drug level monitoring: Secondary | ICD-10-CM

## 2017-05-24 DIAGNOSIS — E1342 Other specified diabetes mellitus with diabetic polyneuropathy: Secondary | ICD-10-CM | POA: Diagnosis present

## 2017-05-24 DIAGNOSIS — G894 Chronic pain syndrome: Secondary | ICD-10-CM | POA: Diagnosis not present

## 2017-05-24 DIAGNOSIS — I63319 Cerebral infarction due to thrombosis of unspecified middle cerebral artery: Secondary | ICD-10-CM | POA: Diagnosis present

## 2017-05-24 DIAGNOSIS — M542 Cervicalgia: Secondary | ICD-10-CM

## 2017-05-24 DIAGNOSIS — M47817 Spondylosis without myelopathy or radiculopathy, lumbosacral region: Secondary | ICD-10-CM | POA: Insufficient documentation

## 2017-05-24 DIAGNOSIS — M25512 Pain in left shoulder: Secondary | ICD-10-CM

## 2017-05-24 DIAGNOSIS — I69354 Hemiplegia and hemiparesis following cerebral infarction affecting left non-dominant side: Secondary | ICD-10-CM | POA: Diagnosis not present

## 2017-05-24 DIAGNOSIS — G8110 Spastic hemiplegia affecting unspecified side: Secondary | ICD-10-CM | POA: Diagnosis present

## 2017-05-24 DIAGNOSIS — I63311 Cerebral infarction due to thrombosis of right middle cerebral artery: Secondary | ICD-10-CM

## 2017-05-24 MED ORDER — OXYCODONE-ACETAMINOPHEN 10-325 MG PO TABS: 1.0000 | ORAL_TABLET | Freq: Three times a day (TID) | ORAL | 0 refills | Status: DC | PRN

## 2017-05-24 NOTE — Progress Notes (Signed)
Subjective:    Patient ID: [name], female    DOB: Jul 13, 1966, 51 y.o.   MRN: [id number]  HPI: [name] is a 51year old female who returns for follow up appointment for chronic pain and medication refill. She statesher pain is located in her neck,bilateral shoulder's,lower back radiating into her bilateral lower extremities.She rates her pain 8. Her current exercise regime is performing stretching exercises daily and walking with her 4 prong cane in her home. She arrived in her wheelchair today.  [name] had oral surgery on 05/20/17, with multiple teeth extractions by Dr. Lorin Picket.   Last UDS was performed on 04/18/2017, it was inconsistent, see note for details.   Pain Inventory Average Pain 8 Pain Right Now 8 My pain is burning, stabbing, tingling and aching  In the last 24 hours, has pain interfered with the following? General activity 6 Relation with others 7 Enjoyment of life 8 What TIME of day is your pain at its worst? evening, night Sleep (in general) Poor  Pain is worse with: walking, bending, sitting, inactivity, standing and some activites Pain improves with: rest, heat/ice and medication Relief from Meds: 5  Mobility walk with assistance ability to climb steps?  yes do you drive?  yes Do you have any goals in this area?  yes  Function disabled: date disabled . I need assistance with the following:  feeding, dressing, bathing, toileting and meal prep Do you have any goals in this area?  yes  Neuro/Psych bladder control problems weakness numbness tingling spasms  Prior Studies Any changes since last visit?  no  Physicians involved in your care Any changes since last visit?  no   Family History  Problem Relation Age of Onset  . Heart disease Mother 47       Died of MI at age 89 yo  . Kidney disease Mother        requiring dialysis  . Congestive Heart Failure Mother   . Heart disease Father 97       MI age 57yo requiring  stenting  . Diabetes Father   . Glaucoma Father   . Heart disease Paternal Grandmother        requiring pacemaker.  [name] Heart disease Paternal Grandfather 46       Died of MI at possibly age 17-53yo  . Stroke Paternal Grandfather   . Diabetes Brother   . Heart disease Brother 90       MI at age 76 years old  . Breast cancer Paternal Aunt   . Breast cancer Maternal Grandmother    Social History   Social History  . Marital status: Single    Spouse name: N/A  . Number of children: 2  . Years of education: 9th grade   Occupational History  . Unemployed     planning on getting disability   Social History Main Topics  . Smoking status: Never Smoker  . Smokeless tobacco: Never Used  . Alcohol use No  . Drug use: No  . Sexual activity: Not Asked   Other Topics Concern  . None   Social History Narrative   Lives in [place] with her son. Is able to read and write fluently in [place].   Past Surgical History:  Procedure Laterality Date  . BI-VENTRICULAR IMPLANTABLE CARDIOVERTER DEFIBRILLATOR N/A 08/22/2013   Procedure: BI-VENTRICULAR IMPLANTABLE CARDIOVERTER DEFIBRILLATOR  (CRT-D);  Surgeon: [name], MD;  Location: [facility] CATH LAB;  Service: Cardiovascular;  Laterality: N/A;  .  BI-VENTRICULAR IMPLANTABLE CARDIOVERTER DEFIBRILLATOR  (CRT-D)  08/2013   [name] 08/23/2013  . BREAST SURGERY Bilateral 2011   patient reports benign results  . CARDIAC CATHETERIZATION  05/2011  . CARPAL TUNNEL RELEASE Left    denies  . HEMIARTHROPLASTY SHOULDER FRACTURE Right 1980's   denies  . MULTIPLE EXTRACTIONS WITH ALVEOLOPLASTY Bilateral 05/20/2017   Procedure: MULTIPLE EXTRACTION;  Surgeon: [name], DDS;  Location: [facility] OR;  Service: Oral Surgery;  Laterality: Bilateral;  . MULTIPLE TOOTH EXTRACTIONS  ~ 2011   tumors removed ; "my whole top"  . SHOULDER ARTHROSCOPY Right 12/26/2015   Procedure: Right Shoulder Arthroscopy, Debridement, and Decompression;  Surgeon: [name], MD;   Location: [facility] OR;  Service: Orthopedics;  Laterality: Right;  . TEE WITHOUT CARDIOVERSION N/A 01/14/2014   Procedure: TRANSESOPHAGEAL ECHOCARDIOGRAM (TEE);  Surgeon: Thurmon Fair, MD;  Location: [facility] ENDOSCOPY;  Service: Cardiovascular;  Laterality: N/A;  . TUBAL LIGATION  05/31/1985   Past Medical History:  Diagnosis Date  . Allergic rhinitis   . Arthritis    "hips, back, legs, arms" (07/04/2014)  . Asthma    hx  . Automatic implantable cardioverter-defibrillator in situ   . Calcifying tendinitis of shoulder   . Chronic combined systolic and diastolic CHF (congestive heart failure) (HCC)    EF 40-45% by echo 12/06/2012  . Chronic diastolic heart failure (HCC)     Primarily diastolic CHF: Likely due to uncontrolled HTN. Last echo (8/12) with EF 45-50%, mild to moderate LVH with some asymmetric septal hypertrophy, RV normal size and systolic function. EF 50-55% by LV-gram in 6/12.   . Chronic lower back pain    secondary to DJD, obsetiy, hip problems. Followed by Dr. Ivory Broad (pain management)  . Coronary artery disease    questionable. LHC 05/2011 showing normal coronaries // Followed at [facility], Dr. Shirlee Latch  . Degeneration of lumbar or lumbosacral intervertebral disc   . DJD (degenerative joint disease) of hip    right sided  . Frequent UTI   . GERD (gastroesophageal reflux disease)   . HLD (hyperlipidemia)   . Hypertension    Poorly controlled. Has had HTN since age 63. Angioedema with ACEI.  24 Hr urine and renal arterial dopplers ordered . . . Never done  . LBBB (left bundle branch block)   . Liver disease   . Morbid obesity (HCC)   . NICM (nonischemic cardiomyopathy) (HCC)    EF 45-50% in 8/12, cath 6/12 showed normal coronaries, EF 50-55% by LV gram  . OSA on CPAP    sleep study in 8/12 showed moderate to severe OSA requiring CPAP  . Polyneuropathy in diabetes(357.2)   . Presence of permanent cardiac pacemaker   . Seizures (HCC)    last 3 months  . Shortness of  breath    none now  . Stroke [facility]) 12/2013   "my left side is paralyzed" (07/04/2014)  . Thoracic or lumbosacral neuritis or radiculitis, unspecified   . Type II diabetes mellitus (HCC) DX: 2002   LMP 05/10/2017   Opioid Risk Score:   Fall Risk Score:  `1  Depression screen PHQ 2/9  Depression screen [facility] 2/9 03/28/2017 02/01/2017 11/22/2016 09/10/2016 07/28/2016 07/14/2016 04/29/2016  Decreased Interest 0 0 0 0 0 0 0  Down, Depressed, Hopeless 0 0 0 0 0 - 0  PHQ - 2 Score 0 0 0 0 0 0 0  Altered sleeping - - - - - - -  Tired, decreased energy - - - - - - -  Change in appetite - - - - - - -  Feeling bad or failure about yourself  - - - - - - -  Trouble concentrating - - - - - - -  Moving slowly or fidgety/restless - - - - - - -  Suicidal thoughts - - - - - - -  PHQ-9 Score - - - - - - -  Some recent data might be hidden    Review of Systems  Constitutional: Positive for appetite change and unexpected weight change.  HENT: Negative.   Eyes: Negative.   Respiratory: Positive for apnea.   Cardiovascular: Negative.   Gastrointestinal: Positive for constipation, nausea and vomiting.  Endocrine:       High blood sugar  Genitourinary: Positive for difficulty urinating.  Musculoskeletal: Positive for arthralgias, gait problem and neck pain.  Skin: Negative.   Allergic/Immunologic: Negative.   Neurological: Positive for weakness and numbness.       Tingling  Hematological: Bruises/bleeds easily.  Psychiatric/Behavioral: Negative.   All other systems reviewed and are negative.      Objective:   Physical Exam  Constitutional: She is oriented to person, place, and time. She appears well-developed and well-nourished.  HENT:  Head: Normocephalic and atraumatic.  Neck: Normal range of motion.  Cardiovascular: Normal rate and regular rhythm.   Pulmonary/Chest: Effort normal and breath sounds normal.  Musculoskeletal:  Normal Muscle Bulk and Muscle Testing Reveals: Upper Extremities:  Right: Full ROM and Muscle Strength 5/5 Left: Paralysis Bilateral AC Joint Tenderness Thoracic Paraspinal Tenderness: T-1-T-3 Lumbar Paraspinal Tenderness: L-3-L-5 Lower Extremities: Right: Full ROM and Muscle Strength 5/5 Left: Paralysis Arrived in wheelchair  Neurological: She is alert and oriented to person, place, and time.  Skin: Skin is warm and dry.  Psychiatric: She has a normal mood and affect.  Nursing note and vitals reviewed.         Assessment & Plan:  1. Chronic low back pain: Continue Current Medication Regime. 05/24/17  Refilled: oxyCODONE 10/325mg  one tablet every 8 hours as needed.#90. Second script given to accommodate scheduled appointment.  We will continue the opioid monitoring program. This consists of regular clinic visits, examinations, urine drug screen, pill counts as well as use of [place] Controlled Substance Reporting System. 2. Diabetic peripheral neuropathy: Continue GabapentinContinue to monitor. 05/24/2017 3. Recent Right MCA infarct: Continue To Monitor.S/P Botox injection05/07/18, Scheduled for Botox in 08/18.  4. Bilateral Shoulder Pain /OA: Continue Voltaren gel   20  minutes of face to face patient care time was spent during this visit. All questions were encouraged and answered.  F/U in 56month

## 2017-05-25 ENCOUNTER — Ambulatory Visit: Payer: Medicaid Other | Attending: Sports Medicine | Admitting: Occupational Therapy

## 2017-05-25 DIAGNOSIS — I69352 Hemiplegia and hemiparesis following cerebral infarction affecting left dominant side: Secondary | ICD-10-CM | POA: Diagnosis present

## 2017-05-25 NOTE — Therapy (Signed)
[facility] [address] [place], [place], [address] Phone: [phone]   Fax:  [phone]  Occupational Therapy Evaluation  Patient Details  Name: [name] MRN: [id number] Date of Birth: 11-08-1966 Referring Provider: Dr. Lorenda Peck  Encounter Date: 05/25/2017      OT End of Session - 05/25/17 1037    Visit Number 1   Number of Visits 3   Date for OT Re-Evaluation 06/24/17   Authorization Type MCD - awaiting authorization   OT Start Time 0920   OT Stop Time 1015   OT Time Calculation (min) 55 min   Activity Tolerance Patient tolerated treatment well      Past Medical History:  Diagnosis Date  . Allergic rhinitis   . Arthritis    "hips, back, legs, arms" (07/04/2014)  . Asthma    hx  . Automatic implantable cardioverter-defibrillator in situ   . Calcifying tendinitis of shoulder   . Chronic combined systolic and diastolic CHF (congestive heart failure) (HCC)    EF 40-45% by echo 12/06/2012  . Chronic diastolic heart failure (HCC)     Primarily diastolic CHF: Likely due to uncontrolled HTN. Last echo (8/12) with EF 45-50%, mild to moderate LVH with some asymmetric septal hypertrophy, RV normal size and systolic function. EF 50-55% by LV-gram in 6/12.   . Chronic lower back pain    secondary to DJD, obsetiy, hip problems. Followed by Dr. Ivory Broad (pain management)  . Coronary artery disease    questionable. LHC 05/2011 showing normal coronaries // Followed at [facility], Dr. Shirlee Latch  . Degeneration of lumbar or lumbosacral intervertebral disc   . DJD (degenerative joint disease) of hip    right sided  . Frequent UTI   . GERD (gastroesophageal reflux disease)   . HLD (hyperlipidemia)   . Hypertension    Poorly controlled. Has had HTN since age 89. Angioedema with ACEI.  24 Hr urine and renal arterial dopplers ordered . . . Never done  . LBBB (left bundle branch block)   . Liver disease    . Morbid obesity (HCC)   . NICM (nonischemic cardiomyopathy) (HCC)    EF 45-50% in 8/12, cath 6/12 showed normal coronaries, EF 50-55% by LV gram  . OSA on CPAP    sleep study in 8/12 showed moderate to severe OSA requiring CPAP  . Polyneuropathy in diabetes(357.2)   . Presence of permanent cardiac pacemaker   . Seizures (HCC)    last 3 months  . Shortness of breath    none now  . Stroke [facility]) 12/2013   "my left side is paralyzed" (07/04/2014)  . Thoracic or lumbosacral neuritis or radiculitis, unspecified   . Type II diabetes mellitus (HCC) DX: 2002    Past Surgical History:  Procedure Laterality Date  . BI-VENTRICULAR IMPLANTABLE CARDIOVERTER DEFIBRILLATOR N/A 08/22/2013   Procedure: BI-VENTRICULAR IMPLANTABLE CARDIOVERTER DEFIBRILLATOR  (CRT-D);  Surgeon: [name], MD;  Location: [facility] CATH LAB;  Service: Cardiovascular;  Laterality: N/A;  . BI-VENTRICULAR IMPLANTABLE CARDIOVERTER DEFIBRILLATOR  (CRT-D)  08/2013   [name] 08/23/2013  . BREAST SURGERY Bilateral 2011   patient reports benign results  . CARDIAC CATHETERIZATION  05/2011  . CARPAL TUNNEL RELEASE Left    denies  . HEMIARTHROPLASTY SHOULDER FRACTURE Right 1980's   denies  . MULTIPLE EXTRACTIONS WITH ALVEOLOPLASTY Bilateral 05/20/2017   Procedure: MULTIPLE EXTRACTION;  Surgeon: [name], DDS;  Location: [facility] OR;  Service: Oral Surgery;  Laterality: Bilateral;  . MULTIPLE  TOOTH EXTRACTIONS  ~ 2011   tumors removed ; "my whole top"  . SHOULDER ARTHROSCOPY Right 12/26/2015   Procedure: Right Shoulder Arthroscopy, Debridement, and Decompression;  Surgeon: [name], MD;  Location: [facility] OR;  Service: Orthopedics;  Laterality: Right;  . TEE WITHOUT CARDIOVERSION N/A 01/14/2014   Procedure: TRANSESOPHAGEAL ECHOCARDIOGRAM (TEE);  Surgeon: Thurmon Fair, MD;  Location: [facility] ENDOSCOPY;  Service: Cardiovascular;  Laterality: N/A;  . TUBAL LIGATION  05/31/1985    There were no vitals filed for this visit.      Subjective  Assessment - 05/25/17 0927    Currently in Pain? Yes   Pain Score 8    Pain Location Arm   Pain Orientation Left   Pain Descriptors / Indicators Aching;Throbbing;Tingling   Pain Onset More than a month ago   Pain Frequency Constant           [facility] OT Assessment - 05/25/17 0001      Assessment   Diagnosis Lt spastic hemiplegia   Referring Provider Dr. Lorenda Peck   Onset Date --  12/2013   Assessment Pt referred today for custom splinting to manage spasticity Lt hand   Prior Therapy outpatient therapy     Precautions   Precautions ICD/Pacemaker  and defibrillator     Balance Screen   Has the patient fallen in the past 6 months No   Has the patient had a decrease in activity level because of a fear of falling?  No   Is the patient reluctant to leave their home because of a fear of falling?  Yes     Home  Environment   Bathroom Shower/Tub Tub/Shower [employer] seat;Grab bars - tub/shower;Bedside commode;Cane -quad   Additional Comments 1 story home with 1 step to enter   Lives With Daughter     Prior Function   Level of Independence Independent  prior to 2015   Vocation On disability     ADL   Eating/Feeding Modified independent  with Rt hand   Grooming --  assist for hair   Upper Body Bathing --  max assist   Lower Body Bathing Maximal assistance   Upper Body Dressing Maximal assistance   Lower Body Dressing Maximal assistance   Toilet Transfer Modified independent   Toileting - Clothing Manipulation Minimal assistance  to fully donn over pants over Lt hip   Toileting -  Hygiene Modified Independent   Tub/Shower Transfer Minimal assistance   ADL comments [facility]'s program - aide comes 2x/day M-F, 1x/day on weekends to asisst with bathing, dressing, and IADLS     Mobility   Mobility Status Needs assist   Mobility Status Comments walks with quad cane mostly, w/c for long distances     Written Expression   Dominant Hand Left  prior  to 2015, now Rt handed     Vision - History   Baseline Vision Wears glasses for distance only     Sensation   Light Touch Appears Intact   Additional Comments reports numbness Lt hand     Coordination   Coordination no functional use Lt hand     Edema   Edema mild Lt hand     Tone   Assessment Location Left Upper Extremity     ROM / Strength   AROM / PROM / Strength --  no active movement LUE     LUE Tone   LUE Tone Moderate  2/4 elbow, 3/4 hand  OT Treatments/Exercises (OP) - 05/25/17 0001      Splinting   Splinting Fabricated and fitted custom resting hand splint to support wrist, fingers, and thumb of Lt hand. Issued splint and educated pt in wear and care.                OT Education - 05/25/17 1015    Education provided Yes   Education Details Splint wear and care   Person(s) Educated Patient   Methods Explanation;Handout;Demonstration   Comprehension Verbalized understanding;Returned demonstration             OT Long Term Goals - 05/25/17 1042      OT LONG TERM GOAL #1   Title Independent with splint wear and care   Baseline issued, may need adjustments   Time 4   Period Weeks   Status New               Plan - 05/25/17 1038    Clinical Impression Statement Pt is a 51 y.o. female who presents to outpatient rehab with spastic hemiplegia Lt dominant side (G81.12) from CVA 12/2013. Pt referred to O.T. for custom splint fabrication for Lt hand.    Occupational Profile and client history currently impacting functional performance significant PMH - see EPIC for details   Occupational performance deficits (Please refer to evaluation for details): Rest and Sleep;Other  positioning to manage pain and spasticity   Rehab Potential Good   Current Impairments/barriers affecting progress: time since onset   OT Frequency --  3 visits over 8 weeks for splint adjustments prn   OT Treatment/Interventions Patient/family  education;Splinting;Neuromuscular education;Self-care/ADL training   Plan splint adjustments prn, if pt does not need further adjustments to splint will d/c episode of care   Clinical Decision Making Limited treatment options, no task modification necessary   Consulted and Agree with Plan of Care Patient      Patient will benefit from skilled therapeutic intervention in order to improve the following deficits and impairments:  Pain, Impaired UE functional use, Impaired tone, Impaired flexibility, Decreased skin integrity  Visit Diagnosis: Spastic hemiplegia of left dominant side as late effect of cerebral infarction (HCC) - Plan: Ot plan of care cert/re-cert    Problem List Patient Active Problem List   Diagnosis Date Noted  . Perimenopausal 03/28/2017  . Chronic use of opiate for therapeutic purpose 12/14/2016  . Lumbago 11/22/2016  . Type 2 diabetes mellitus, uncontrolled, with neuropathy (HCC) 02/04/2016  . Left spastic hemiparesis (HCC) 09/23/2015  . Left shoulder pain 06/30/2015  . Localization-related symptomatic epilepsy and epileptic syndromes with complex partial seizures, not intractable, without status epilepticus (HCC) 06/13/2015  . Hemiparesis affecting left side as late effect of cerebrovascular accident (HCC) 06/13/2015  . Morbid obesity (HCC) 06/13/2015  . [place] intertrigo 11/05/2014  . Tachycardia 07/04/2014  . Biventricular implantable cardiac defibrillator -[employer] 11/23/2013  . Mixed stress and urge urinary incontinence 10/11/2013  . Health care maintenance 09/13/2013  . Chronic combined systolic and diastolic heart failure (HCC) 08/01/2013  . Nonischemic cardiomyopathy (HCC) 08/01/2013  . LBBB (left bundle branch block) 07/19/2013  . Depression 04/09/2013  . Primary osteoarthritis of left knee 04/14/2012  . Lumbar spondylosis 04/14/2012  . Diabetic peripheral neuropathy (HCC) 04/14/2012  . Hyperlipidemia 11/23/2011  . OSA (obstructive sleep apnea)  08/27/2011  . Benign essential HTN 08/27/2010    Kelli Churn, OTR/L 05/25/2017, 10:47 AM  [facility] [address] [place], [place], [address] Phone: [phone]  Fax:  [phone]  Name: [name] MRN: [id number] Date of Birth: 1966-07-12

## 2017-05-25 NOTE — Patient Instructions (Signed)
SPLINT WEAR AND CARE  Your Splint This splint should initially be fitted by a healthcare practitioner.  The healthcare practitioner is responsible for providing wearing instructions and precautions to the patient, other healthcare practitioners and care provider involved in the patient's care.  This splint was custom made for you. Please read the following instructions to learn about wearing and caring for your splint.  Precautions Should your splint cause any of the following problems, remove the splint immediately and contact your therapist/physician.  Swelling  Severe Pain  Pressure Areas  Stiffness  Numbness  Do not wear your splint while operating machinery unless it has been fabricated for that purpose.  When To Wear Your Splint Where your splint according to your therapist/physician instructions. Daytime for 2 hours, then gradually increase up to 6 hours a day over the next few days. Once you can tolerate 6 consecutive hours with NO problems, then you can wear at night.   Care and Cleaning of Your Splint 1. Keep your splint away from open flames. 2. Your splint will lose its shape in temperatures over 135 degrees Farenheit, ( in car windows, near radiators, ovens or in hot water).  Never make any adjustments to your splint, if the splint needs adjusting remove it and make an appointment to see your therapist. 3. Your splint may be cleaned with rubbing alcohol.  Do not immerse in hot water over 135 degrees Farenheit. 4. Straps may be washed with soap and water, but do not moisten the self-adhesive portion. 5. For ink or hard to remove spots use a scouring cleanser which contains chlorine.  Rinse the splint thoroughly after using chlorine cleanser.

## 2017-05-25 NOTE — Telephone Encounter (Signed)
Dr. Peggyann Juba will be graduating from the program this June.  Patient will get letter in the mail informing her of new primary and to call for an appointment.  Please refer to letter that Doris typed on 05/20/17.

## 2017-05-25 NOTE — Telephone Encounter (Signed)
Spoke with patient, she says her blood sugars are alright and she is 'going through a lot right now" and will call if she needs anything." offered assistance as needed.

## 2017-05-31 ENCOUNTER — Telehealth: Payer: Self-pay | Admitting: Dietician

## 2017-06-01 NOTE — Telephone Encounter (Signed)
Called patient to offer support and appointment for diabetes care follow up. She could not talk and said she'd call back on Wednesday.

## 2017-06-03 ENCOUNTER — Telehealth: Payer: Self-pay | Admitting: *Deleted

## 2017-06-03 NOTE — Telephone Encounter (Signed)
Pt is on daily Xarelto and she has a complicated medical hx and she needs an OV with PA-C or [facility].  No gi hx. Called and LM on VM to return call so we can schedule OV.  I already cancelled PV and scheduled colon for this pt.  In black  book in room 51   [facility]

## 2017-06-06 ENCOUNTER — Telehealth: Payer: Self-pay | Admitting: *Deleted

## 2017-06-06 NOTE — Telephone Encounter (Signed)
Error

## 2017-06-06 NOTE — Telephone Encounter (Addendum)
Spoke with patient about her OV appointment, and no one has any openings until the template for July is out.   I told her that we would call her toward the end of this week to schedule an OV for her since Dr. Adela Lank or a PA could see her.   Unable to reach [place] to schedule an ov.  All booked up through August.  Will forward to next [facility] nurse.

## 2017-06-07 ENCOUNTER — Other Ambulatory Visit: Payer: Self-pay | Admitting: Internal Medicine

## 2017-06-07 DIAGNOSIS — B372 Candidiasis of skin and nail: Secondary | ICD-10-CM

## 2017-06-08 ENCOUNTER — Ambulatory Visit: Payer: Medicaid Other | Admitting: Occupational Therapy

## 2017-06-10 ENCOUNTER — Encounter: Payer: Medicaid Other | Admitting: Internal Medicine

## 2017-06-10 ENCOUNTER — Encounter: Payer: Self-pay | Admitting: Gastroenterology

## 2017-06-13 ENCOUNTER — Encounter: Payer: Self-pay | Admitting: Internal Medicine

## 2017-06-20 ENCOUNTER — Other Ambulatory Visit: Payer: Self-pay | Admitting: Internal Medicine

## 2017-06-24 ENCOUNTER — Encounter: Payer: Medicaid Other | Admitting: Gastroenterology

## 2017-06-28 ENCOUNTER — Encounter: Payer: Medicaid Other | Admitting: Registered Nurse

## 2017-06-29 ENCOUNTER — Encounter (HOSPITAL_COMMUNITY): Payer: Medicaid Other | Admitting: Cardiology

## 2017-07-05 ENCOUNTER — Encounter: Payer: Medicaid Other | Attending: Physical Medicine & Rehabilitation | Admitting: Registered Nurse

## 2017-07-05 ENCOUNTER — Encounter: Payer: Self-pay | Admitting: Registered Nurse

## 2017-07-05 VITALS — BP 127/88 | HR 99 | Temp 97.9°F

## 2017-07-05 DIAGNOSIS — I5042 Chronic combined systolic (congestive) and diastolic (congestive) heart failure: Secondary | ICD-10-CM | POA: Insufficient documentation

## 2017-07-05 DIAGNOSIS — I69354 Hemiplegia and hemiparesis following cerebral infarction affecting left non-dominant side: Secondary | ICD-10-CM

## 2017-07-05 DIAGNOSIS — E1342 Other specified diabetes mellitus with diabetic polyneuropathy: Secondary | ICD-10-CM | POA: Insufficient documentation

## 2017-07-05 DIAGNOSIS — M1712 Unilateral primary osteoarthritis, left knee: Secondary | ICD-10-CM | POA: Diagnosis not present

## 2017-07-05 DIAGNOSIS — M25512 Pain in left shoulder: Secondary | ICD-10-CM | POA: Diagnosis not present

## 2017-07-05 DIAGNOSIS — M47817 Spondylosis without myelopathy or radiculopathy, lumbosacral region: Secondary | ICD-10-CM | POA: Insufficient documentation

## 2017-07-05 DIAGNOSIS — G8110 Spastic hemiplegia affecting unspecified side: Secondary | ICD-10-CM | POA: Insufficient documentation

## 2017-07-05 DIAGNOSIS — Z79899 Other long term (current) drug therapy: Secondary | ICD-10-CM | POA: Diagnosis present

## 2017-07-05 DIAGNOSIS — I63311 Cerebral infarction due to thrombosis of right middle cerebral artery: Secondary | ICD-10-CM | POA: Diagnosis not present

## 2017-07-05 DIAGNOSIS — M542 Cervicalgia: Secondary | ICD-10-CM

## 2017-07-05 DIAGNOSIS — E1142 Type 2 diabetes mellitus with diabetic polyneuropathy: Secondary | ICD-10-CM | POA: Diagnosis not present

## 2017-07-05 DIAGNOSIS — M47816 Spondylosis without myelopathy or radiculopathy, lumbar region: Secondary | ICD-10-CM

## 2017-07-05 DIAGNOSIS — I63319 Cerebral infarction due to thrombosis of unspecified middle cerebral artery: Secondary | ICD-10-CM | POA: Diagnosis present

## 2017-07-05 DIAGNOSIS — G629 Polyneuropathy, unspecified: Secondary | ICD-10-CM | POA: Insufficient documentation

## 2017-07-05 DIAGNOSIS — Z5181 Encounter for therapeutic drug level monitoring: Secondary | ICD-10-CM

## 2017-07-05 DIAGNOSIS — G894 Chronic pain syndrome: Secondary | ICD-10-CM

## 2017-07-05 MED ORDER — OXYCODONE-ACETAMINOPHEN 10-325 MG PO TABS: 1.0000 | ORAL_TABLET | Freq: Three times a day (TID) | ORAL | 0 refills | Status: DC | PRN

## 2017-07-05 NOTE — Progress Notes (Signed)
Subjective:    Patient ID: [name], female    DOB: 07/12/1966, 51 y.o.   MRN: [id number]  HPI: [name] is a 51year old female who returns for follow up appointmentfor chronic pain and medication refill. She statesher pain is located in her neck, left  shoulder,lower back and right knee..She rates her pain 6. Her current exercise regime is performing stretching exercises daily and walking with her 4 prong cane in her home. She arrived in wheelchair today.  [name] going [employer] today regarding her Left AFO, she states.   Last UDS was performed on 04/18/2017, it was inconsistent, see note for details.   Pain Inventory Average Pain 6 Pain Right Now 6 My pain is tingling and aching  In the last 24 hours, has pain interfered with the following? General activity 5 Relation with others 5 Enjoyment of life 6 What TIME of day is your pain at its worst? evening,morning Sleep (in general) Jacobs  Pain is worse with: walking, bending, sitting, standing and some activites Pain improves with: rest, pacing activities and medication Relief from Meds: 6  Mobility walk with assistance ability to climb steps?  no do you drive?  yes use a wheelchair Do you have any goals in this area?  yes  Function not employed: date last employed . disabled: date disabled . I need assistance with the following:  dressing, bathing, meal prep, household duties and shopping Do you have any goals in this area?  yes  Neuro/Psych bladder control problems weakness numbness tingling anxiety loss of taste or smell  Prior Studies .  Physicians involved in your care .   Family History  Problem Relation Age of Onset  . Heart disease Mother 58       Died of MI at age 73 yo  . Kidney disease Mother        requiring dialysis  . Congestive Heart Failure Mother   . Heart disease Father 36       MI age 70yo requiring stenting  . Diabetes Father   . Glaucoma Father   .  Heart disease Paternal Grandmother        requiring pacemaker.  [name] Heart disease Paternal Grandfather 107       Died of MI at possibly age 21-53yo  . Stroke Paternal Grandfather   . Diabetes Brother   . Heart disease Brother 54       MI at age 71 years old  . Breast cancer Paternal Aunt   . Breast cancer Maternal Grandmother    Social History   Social History  . Marital status: Single    Spouse name: N/A  . Number of children: 2  . Years of education: 9th grade   Occupational History  . Unemployed     planning on getting disability   Social History Main Topics  . Smoking status: Never Smoker  . Smokeless tobacco: Never Used  . Alcohol use No  . Drug use: No  . Sexual activity: Not Asked   Other Topics Concern  . None   Social History Narrative   Lives in [place] with her son. Is able to read and write fluently in [place].   Past Surgical History:  Procedure Laterality Date  . BI-VENTRICULAR IMPLANTABLE CARDIOVERTER DEFIBRILLATOR N/A 08/22/2013   Procedure: BI-VENTRICULAR IMPLANTABLE CARDIOVERTER DEFIBRILLATOR  (CRT-D);  Surgeon: Caitlyn Salvia, MD;  Location: [facility] CATH LAB;  Service: Cardiovascular;  Laterality: N/A;  . BI-VENTRICULAR IMPLANTABLE CARDIOVERTER DEFIBRILLATOR  (  CRT-D)  08/2013   [name] 08/23/2013  . BREAST SURGERY Bilateral 2011   patient reports benign results  . CARDIAC CATHETERIZATION  05/2011  . CARPAL TUNNEL RELEASE Left    denies  . HEMIARTHROPLASTY SHOULDER FRACTURE Right 1980's   denies  . MULTIPLE EXTRACTIONS WITH ALVEOLOPLASTY Bilateral 05/20/2017   Procedure: MULTIPLE EXTRACTION;  Surgeon: [name], DDS;  Location: [facility] OR;  Service: Oral Surgery;  Laterality: Bilateral;  . MULTIPLE TOOTH EXTRACTIONS  ~ 2011   tumors removed ; "my whole top"  . SHOULDER ARTHROSCOPY Right 12/26/2015   Procedure: Right Shoulder Arthroscopy, Debridement, and Decompression;  Surgeon: [name], MD;  Location: [facility] OR;  Service: Orthopedics;  Laterality: Right;    . TEE WITHOUT CARDIOVERSION N/A 01/14/2014   Procedure: TRANSESOPHAGEAL ECHOCARDIOGRAM (TEE);  Surgeon: Caitlyn Fair, MD;  Location: [facility] ENDOSCOPY;  Service: Cardiovascular;  Laterality: N/A;  . TUBAL LIGATION  05/31/1985   Past Medical History:  Diagnosis Date  . Allergic rhinitis   . Arthritis    "hips, back, legs, arms" (07/04/2014)  . Asthma    hx  . Automatic implantable cardioverter-defibrillator in situ   . Calcifying tendinitis of shoulder   . Chronic combined systolic and diastolic CHF (congestive heart failure) (HCC)    EF 40-45% by echo 12/06/2012  . Chronic diastolic heart failure (HCC)     Primarily diastolic CHF: Likely due to uncontrolled HTN. Last echo (8/12) with EF 45-50%, mild to moderate LVH with some asymmetric septal hypertrophy, RV normal size and systolic function. EF 50-55% by LV-gram in 6/12.   . Chronic lower back pain    secondary to DJD, obsetiy, hip problems. Followed by Dr. Ivory Broad (pain management)  . Coronary artery disease    questionable. LHC 05/2011 showing normal coronaries // Followed at [facility], Dr. Shirlee Jacobs  . Degeneration of lumbar or lumbosacral intervertebral disc   . DJD (degenerative joint disease) of hip    right sided  . Frequent UTI   . GERD (gastroesophageal reflux disease)   . HLD (hyperlipidemia)   . Hypertension    Poorly controlled. Has had HTN since age 36. Angioedema with ACEI.  24 Hr urine and renal arterial dopplers ordered . . . Never done  . LBBB (left bundle branch block)   . Liver disease   . Morbid obesity (HCC)   . NICM (nonischemic cardiomyopathy) (HCC)    EF 45-50% in 8/12, cath 6/12 showed normal coronaries, EF 50-55% by LV gram  . OSA on CPAP    sleep study in 8/12 showed moderate to severe OSA requiring CPAP  . Polyneuropathy in diabetes(357.2)   . Presence of permanent cardiac pacemaker   . Seizures (HCC)    last 3 months  . Shortness of breath    none now  . Stroke [facility]) 12/2013   "my left side  is paralyzed" (07/04/2014)  . Thoracic or lumbosacral neuritis or radiculitis, unspecified   . Type II diabetes mellitus (HCC) DX: 2002   BP 127/88   Pulse 99   Temp 97.9 F (36.6 C)   SpO2 98%   Opioid Risk Score:   Fall Risk Score:  `1  Depression screen PHQ 2/9  Depression screen [facility] 2/9 03/28/2017 02/01/2017 11/22/2016 09/10/2016 07/28/2016 07/14/2016 04/29/2016  Decreased Interest 0 0 0 0 0 0 0  Down, Depressed, Hopeless 0 0 0 0 0 - 0  PHQ - 2 Score 0 0 0 0 0 0 0  Altered sleeping - - - - - - -  Tired, decreased energy - - - - - - -  Change in appetite - - - - - - -  Feeling bad or failure about yourself  - - - - - - -  Trouble concentrating - - - - - - -  Moving slowly or fidgety/restless - - - - - - -  Suicidal thoughts - - - - - - -  PHQ-9 Score - - - - - - -  Some recent data might be hidden    Review of Systems  Constitutional: Positive for appetite change and fever.  Gastrointestinal: Positive for abdominal pain, constipation and nausea.       Poor appetite  Skin: Positive for rash.  Hematological: Bruises/bleeds easily.       Objective:   Physical Exam  Constitutional: She is oriented to person, place, and time. She appears well-developed and well-nourished.  HENT:  Head: Normocephalic and atraumatic.  Neck: Normal range of motion. Neck supple.  Cardiovascular: Normal rate.   Pulmonary/Chest: Effort normal and breath sounds normal.  Musculoskeletal:  Normal Muscle Bulk and Muscle Testing Reveals: Upper Extremities: Right: Full ROM and Muscle Strength 5/5 Left: Paralysis Lower Extremities: Right: Full ROM and Muscle Strength 5/5 Left: Paralysis wearing AFO Arrived in wheelchair  Neurological: She is alert and oriented to person, place, and time.  Skin: Skin is warm and dry.  Psychiatric: She has a normal mood and affect.  Nursing note and vitals reviewed.         Assessment & Plan:  1. Chronic low back pain: Continue Current Medication Regime.  07/05/17 Refilled: oxyCODONE 10/325mg  one tablet every 8 hours as needed.#90.  We will continue the opioid monitoring program. This consists of regular clinic visits, examinations, urine drug screen, pill counts as well as use of [place] Controlled Substance Reporting System. 2. Diabetic peripheral neuropathy: Continue Gabapentin.Continue to monitor. 07/05/2017 3. Recent Right MCA infarct: Continue To Monitor. Scheduled for Botox on 08/18.07/05/2017. 4. Bilateral Shoulder Pain /OA: Continue Voltaren gel. 07/05/2017.  20 minutes of face to face patient care time was spent during this visit. All questions were encouraged and answered.   F/U in 83month

## 2017-07-11 ENCOUNTER — Telehealth: Payer: Self-pay | Admitting: Registered Nurse

## 2017-07-11 NOTE — Telephone Encounter (Signed)
On 07/11/2017 the  NCCSR was reviewed no conflict was seen on the [facility] Controlled Substance Reporting System with multiple prescribers. Ms. Capri has a signed narcotic contract with our office. If there were any discrepancies this would have been reported to her physician.

## 2017-07-12 ENCOUNTER — Other Ambulatory Visit: Payer: Self-pay | Admitting: Registered Nurse

## 2017-07-12 DIAGNOSIS — M1712 Unilateral primary osteoarthritis, left knee: Secondary | ICD-10-CM

## 2017-07-12 DIAGNOSIS — M47896 Other spondylosis, lumbar region: Secondary | ICD-10-CM

## 2017-07-19 ENCOUNTER — Ambulatory Visit (HOSPITAL_COMMUNITY)
Admission: RE | Admit: 2017-07-19 | Discharge: 2017-07-19 | Disposition: A | Discharge status: Home / Self Care | Payer: No Typology Code available for payment source | Source: Ambulatory Visit | Attending: Cardiology | Admitting: Cardiology

## 2017-07-19 ENCOUNTER — Encounter (HOSPITAL_COMMUNITY): Payer: Self-pay | Admitting: Cardiology

## 2017-07-19 VITALS — BP 139/83 | HR 81 | Wt 232.8 lb

## 2017-07-19 DIAGNOSIS — I69354 Hemiplegia and hemiparesis following cerebral infarction affecting left non-dominant side: Secondary | ICD-10-CM | POA: Insufficient documentation

## 2017-07-19 DIAGNOSIS — E1142 Type 2 diabetes mellitus with diabetic polyneuropathy: Secondary | ICD-10-CM | POA: Diagnosis not present

## 2017-07-19 DIAGNOSIS — I2510 Atherosclerotic heart disease of native coronary artery without angina pectoris: Secondary | ICD-10-CM | POA: Insufficient documentation

## 2017-07-19 DIAGNOSIS — Z7901 Long term (current) use of anticoagulants: Secondary | ICD-10-CM | POA: Diagnosis not present

## 2017-07-19 DIAGNOSIS — I110 Hypertensive heart disease with heart failure: Secondary | ICD-10-CM | POA: Insufficient documentation

## 2017-07-19 DIAGNOSIS — G4733 Obstructive sleep apnea (adult) (pediatric): Secondary | ICD-10-CM | POA: Insufficient documentation

## 2017-07-19 DIAGNOSIS — I447 Left bundle-branch block, unspecified: Secondary | ICD-10-CM | POA: Diagnosis not present

## 2017-07-19 DIAGNOSIS — I5022 Chronic systolic (congestive) heart failure: Secondary | ICD-10-CM | POA: Diagnosis present

## 2017-07-19 DIAGNOSIS — Z794 Long term (current) use of insulin: Secondary | ICD-10-CM | POA: Insufficient documentation

## 2017-07-19 DIAGNOSIS — I5042 Chronic combined systolic (congestive) and diastolic (congestive) heart failure: Secondary | ICD-10-CM | POA: Diagnosis not present

## 2017-07-19 DIAGNOSIS — I4891 Unspecified atrial fibrillation: Secondary | ICD-10-CM | POA: Insufficient documentation

## 2017-07-19 DIAGNOSIS — Z8673 Personal history of transient ischemic attack (TIA), and cerebral infarction without residual deficits: Secondary | ICD-10-CM | POA: Diagnosis not present

## 2017-07-19 DIAGNOSIS — E785 Hyperlipidemia, unspecified: Secondary | ICD-10-CM | POA: Diagnosis not present

## 2017-07-19 DIAGNOSIS — I428 Other cardiomyopathies: Secondary | ICD-10-CM | POA: Insufficient documentation

## 2017-07-19 DIAGNOSIS — Z79899 Other long term (current) drug therapy: Secondary | ICD-10-CM | POA: Insufficient documentation

## 2017-07-19 DIAGNOSIS — R569 Unspecified convulsions: Secondary | ICD-10-CM | POA: Insufficient documentation

## 2017-07-19 DIAGNOSIS — I480 Paroxysmal atrial fibrillation: Secondary | ICD-10-CM | POA: Diagnosis not present

## 2017-07-19 DIAGNOSIS — K769 Liver disease, unspecified: Secondary | ICD-10-CM | POA: Insufficient documentation

## 2017-07-19 DIAGNOSIS — K219 Gastro-esophageal reflux disease without esophagitis: Secondary | ICD-10-CM | POA: Insufficient documentation

## 2017-07-19 DIAGNOSIS — Z95810 Presence of automatic (implantable) cardiac defibrillator: Secondary | ICD-10-CM | POA: Insufficient documentation

## 2017-07-19 LAB — BASIC METABOLIC PANEL
Anion gap: 9 (ref 5–15)
BUN: 6 mg/dL (ref 6–20)
CALCIUM: 9.4 mg/dL (ref 8.9–10.3)
CO2: 27 mmol/L (ref 22–32)
CREATININE: 0.72 mg/dL (ref 0.44–1.00)
Chloride: 100 mmol/L — ABNORMAL LOW (ref 101–111)
GFR calc Af Amer: >60 mL/min (ref >60)
GLUCOSE: 128 mg/dL — AB (ref 65–99)
Potassium: 3.9 mmol/L (ref 3.5–5.1)
Sodium: 136 mmol/L (ref 135–145)

## 2017-07-19 LAB — DIGOXIN LEVEL: Digoxin Level: 0.6 ng/mL — ABNORMAL LOW (ref 0.8–2.0)

## 2017-07-19 LAB — BRAIN NATRIURETIC PEPTIDE: B Natriuretic Peptide: 10.5 pg/mL (ref 0.0–100.0)

## 2017-07-19 NOTE — Patient Instructions (Addendum)
Labs today  You have been referred to Physical therapy, [facility] will contact you to schedule  Your physician recommends that you schedule a follow-up appointment in: 3 months

## 2017-07-21 NOTE — Progress Notes (Signed)
Patient ID: [name], female   DOB: 1966/06/11, 51 y.o.   MRN: [id number]    Advanced Heart Failure Clinic Note   EP: Dr Caryl Comes PCP: Dr Cat[facility] Primary HF Cardiologist: Dr Aundra Dubin   HPI: 51 yo with history of morbid obesity, HTN, HLD, CAD, DM2, OSA on CPAP, CVA and chronic systolic CHF in the setting of poorly-controlled HTN. She was admitted in 05/2011 with chest pain and new LBBB and ended up getting a left heart cath, which showed no angiographic CAD. Renal artery dopplers were normal in 04/2012.  She has a Medtronic CRT-D device.   Was hospitalized May 2014 with acute on chronic combined systolic and diastolic CHF. EF from December 2013 was down to 40 to 45%.   Admitted in January 2015 with slurred speech, L arm weakness and facial droop. Initial head CT showed no acute abnormality. Repeat head CT showed subtle gray white matter differentiation in R frontal lobe and R operculum perhaps representing ischemic R sided MCA. STAT repeat CT head later on 1/29 showed further evolution of R MCA territory infarct involving R insular cortex and operculum. No hemorrhage. Neurology recommended CTA head/neck, which showed occlusion of MCA branch to the area of R MCA infarct and a normal neck CTA. TEE was done and did not reveal embolic source. Was discharged to inpatient rehab.  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  She was started on Keppra.  She went to the ER in 2/16 with syncope that was probably due to seizure, no arrhythmias with device interrogation.  Echo in 11/16 showed EF back down to 25-30%.    She presents today for HF followup. She is not walking much due to left-sided weakness.  Uses a walker.  No dyspnea walking around the house.  No orthopnea/PND.  No chest pain.  Not getting PT currently.   Uses wheelchair when she leaves the house.   Weight is stable.   Optivol checked: impedance decreased a bit but seems to have  stabilized (fluid index < threshold). 99% BiV pacing.  No VT/AF.   ECG (personally reviewed): NSR, BiV pacing  ECHO 11/2012: 40-45% ECHO  07/2013: 20% RV normal ECHO    08/23/2013: EF 40-45%, diff HK ECHO   09/05/2013: EF 25-30%, diff HK  ECHO  12/11/13: EF 30%, diff HK, RV normal ECHO 01/14/2014: EF 35-40%, LA mod dilated  ECHO 4/16: EF 40-45%, moderate LVH, normal RV size with mildly decreased systolic function.   ECHO 11/16: EF 25-30%, moderate LVH ECHO 2/18: EF 45%, moderate LVH, normal RV size and systolic function.   Labs 06/2013: proBNP 230, Cr 0.9, K 3.7          08/2013: K+ 3.9, Cr 0.9       09/2013: K 4.2, Cr 0.86, pro-BNP 718, dig level <0.3     10/07/13: K+ 4.1, Cr 0.83       05/08/14: K 4.0 Creatinine 0.67 Pro BNP 233 Dig level 0.3        08/20/2014 K 3.8 nCreatinine 0.56        2/16: K 3.6, creatinine 0.95, LDL 106, HDL 43       3/16: K 3.5, creatinine 0.87, HCT 35.9       7/16: K 3.5, creatinine 0.78. HCT 37       10/16: digoxin 0.2, LDL 116       1/17: K 3.6, creatinine 0.77, HCT 38  5/17: digoxin 0.9       8/17: K 4.2, creatinine 0.68, HCT 35.7       11/17: K 3.8, creatinine 0.76        6/18: digoxin 0.9, K 3.5, creatinine 0.9, hgb 13.9  ROS: All systems negative except as listed in HPI, PMH and Problem List.  Past Medical History:  Diagnosis Date  . Allergic rhinitis   . Arthritis    "hips, back, legs, arms" (07/04/2014)  . Asthma    hx  . Automatic implantable cardioverter-defibrillator in situ   . Calcifying tendinitis of shoulder   . Chronic combined systolic and diastolic CHF (congestive heart failure) ([place])    EF 40-45% by echo 12/06/2012  . Chronic diastolic heart failure (HCC)     Primarily diastolic CHF: Likely due to uncontrolled HTN. Last echo (8/12) with EF 45-50%, mild to moderate LVH with some asymmetric septal hypertrophy, RV normal size and systolic function. EF 50-55% by LV-gram in 6/12.   . Chronic lower back pain    secondary to DJD,  obsetiy, hip problems. Followed by Dr. Oval Linsey (pain management)  . Coronary artery disease    questionable. LHC 05/2011 showing normal coronaries // Followed at [facility], Dr. Aundra Dubin  . Degeneration of lumbar or lumbosacral intervertebral disc   . DJD (degenerative joint disease) of hip    right sided  . Frequent UTI   . GERD (gastroesophageal reflux disease)   . HLD (hyperlipidemia)   . Hypertension    Poorly controlled. Has had HTN since age 28. Angioedema with ACEI.  24 Hr urine and renal arterial dopplers ordered . . . Never done  . LBBB (left bundle branch block)   . Liver disease   . Morbid obesity ([place])   . NICM (nonischemic cardiomyopathy) ([place])    EF 45-50% in 8/12, cath 6/12 showed normal coronaries, EF 50-55% by LV gram  . OSA on CPAP    sleep study in 8/12 showed moderate to severe OSA requiring CPAP  . Polyneuropathy in diabetes(357.2)   . Presence of permanent cardiac pacemaker   . Seizures ([place])    last 3 months  . Shortness of breath    none now  . Stroke [facility]) 12/2013   "my left side is paralyzed" (07/04/2014)  . Thoracic or lumbosacral neuritis or radiculitis, unspecified   . Type II diabetes mellitus ([place]) DX: 2002    Current Outpatient Prescriptions  Medication Sig Dispense Refill  . ACCU-CHEK FASTCLIX LANCETS MISC Check blood sugar 3 times a day 102 each 12  . ACCU-CHEK SMARTVIEW test strip USE TO CHECK BLOOD SUGAR THREE TIMES DAILY 100 each 12  . amLODipine (NORVASC) 10 MG tablet TAKE 1 TABLET BY MOUTH EVERY DAY 90 tablet 3  . amoxicillin (AMOXIL) 500 MG capsule Take 500 mg by mouth at bedtime.    [name] atorvastatin (LIPITOR) 40 MG tablet TAKE ONE TABLET BY MOUTH ONCE DAILY 30 tablet 6  . Blood Glucose Monitoring Suppl (ACCU-CHEK GUIDE) w/Device KIT 1 each by Does not apply route 3 (three) times daily. 1 kit 1  . calamine lotion Apply 1 application topically as needed for itching. 120 mL 0  . cyclobenzaprine (FLEXERIL) 10 MG tablet Take 10 mg by  mouth 3 (three) times daily as needed for muscle spasms.    . diclofenac sodium (VOLTAREN) 1 % GEL APPLY 2 GM TOPICALLY FOUR TIMES DAILY AS NEEDED FOR PAIN 300 g 2  . DIGOX 125 MCG tablet TAKE ONE TABLET BY  MOUTH EVERY DAY 30 tablet 11  . fluticasone (CUTIVATE) 0.05 % cream Apply topically 2 (two) times daily. Dispense two tubes 30 g 6  . furosemide (LASIX) 40 MG tablet TAKE 1 TABLET BY MOUTH TWICE A WEEK 10 tablet 3  . gabapentin (NEURONTIN) 300 MG capsule Take 1 capsule (300 mg total) by mouth 2 (two) times daily. 60 capsule 2  . hydrALAZINE (APRESOLINE) 50 MG tablet TAKE 1 & 1/2 TABLETS BY MOUTH 3 TIMES A DAY 405 tablet 3  . hydrocortisone cream 1 % Apply to itchy skin 2 times daily 30 g 1  . Insulin Glargine (LANTUS SOLOSTAR) 100 UNIT/ML Solostar Pen Inject 52 Units into the skin daily at 10 pm. 30 mL 6  . isosorbide mononitrate (IMDUR) 60 MG 24 hr tablet TAKE 1 AND 1/2 TABLET BY MOUTH DAILY 45 tablet 3  . levETIRAcetam (KEPPRA) 500 MG tablet TAKE 1 TABLET BY MOUTH TWO TIMES DAILY **MUST NOT MISS ANY DOSE** 180 tablet 1  . liver oil-zinc oxide (DESITIN) 40 % ointment Apply 1 application topically as needed for irritation. To skin folds 56.7 g 3  . metFORMIN (GLUCOPHAGE-XR) 500 MG 24 hr tablet Take 500 mg by mouth 2 (two) times daily.    . metoprolol succinate (TOPROL-XL) 100 MG 24 hr tablet TAKE 1 TABLET BY MOUTH TWO TIMES DAILY 60 tablet 2  . mupirocin cream (BACTROBAN) 2 % Apply 1 application topically 2 (two) times daily. 15 g 2  . nystatin (MYCOSTATIN) 100000 UNIT/ML suspension 5 ml daily - swish in mouth and then spit out 60 mL 0  . nystatin (NYSTATIN) powder Apply to affected area twice daily until healed. 30 g 2  . nystatin cream (MYCOSTATIN) APPLY UNDER ABDOMINAL SKIN AND ON BUTTOCKS 2 TIMES A DAY UNTIL RASH RESOLVES 30 g 2  . ondansetron (ZOFRAN ODT) 4 MG disintegrating tablet Take 1 tablet (4 mg total) by mouth every 8 (eight) hours as needed for nausea or vomiting. 20 tablet 0  .  oxyCODONE-acetaminophen (PERCOCET) 10-325 MG tablet Take 1 tablet by mouth every 8 (eight) hours as needed for pain. 90 tablet 0  . pantoprazole (PROTONIX) 40 MG tablet Take 1 tablet (40 mg total) by mouth daily. 90 tablet 1  . polyethylene glycol (MIRALAX / GLYCOLAX) packet MIX 1 PACKET IN 8OZ OF LIQUID EVERY DAY 14 each 0  . rivaroxaban (XARELTO) 20 MG TABS tablet Take 1 tablet (20 mg total) by mouth daily with supper. 30 tablet 3  . senna-docusate (SB DOCUSATE SODIUM/SENNA) 8.6-50 MG tablet Take 1 tablet by mouth at bedtime as needed for mild constipation.    . solifenacin (VESICARE) 10 MG tablet Take 10 mg by mouth daily.    [name] spironolactone (ALDACTONE) 50 MG tablet Take 1 tablet (50 mg total) by mouth daily. 90 tablet 1  . UNIFINE PENTIPS 31G X 5 MM MISC USE TO INJECT INSULIN TWO TIMES DAILY 100 each 0  . Vitamin D, Ergocalciferol, (DRISDOL) 50000 units CAPS capsule TAKE ONE CAPSULE BY MOUTH EVERY MONDAY 4 capsule 0   No current facility-administered medications for this encounter.    Vitals:   07/19/17 1108  BP: 139/83  Pulse: 81  SpO2: 99%  Weight: 232 lb 12 oz (105.6 kg)    Wt Readings from Last 3 Encounters:  07/19/17 232 lb 12 oz (105.6 kg)  05/20/17 232 lb (105.2 kg)  05/19/17 232 lb (105.2 kg)     PHYSICAL EXAM: General: NAD Neck: No JVD, no thyromegaly or thyroid nodule.  Lungs: Clear to auscultation bilaterally with normal respiratory effort. CV: Nondisplaced PMI.  Heart regular S1/S2, no S3/S4, no murmur.  No peripheral edema.  No carotid bruit.  Normal pedal pulses.  Abdomen: Soft, nontender, no hepatosplenomegaly, no distention.  Skin: Intact without lesions or rashes.  Neurologic: Alert and oriented x 3.  Left-sided weakness, left facial droop.  Psych: Normal affect. Extremities: No clubbing or cyanosis.  HEENT: Normal.   ASSESSMENT/PLAN: 1. Chronic systolic HF: Nonischemic cardiomyopathy, normal coronaries on 6/12 angiography.  Cardiomyopathy may be due to  long-standing and poorly-controlled HTN.  Echo 11/16 EF 25-30%, echo 2/18 with EF improved to 45%.  S/P CRT-D Medtronic.  No significant dyspnea but not very active.  Weight stable.   - Continue Lasix 2 times a week.  - Continue current spironolactone, digoxin (check level today), and Toprol XL.  - BMET today.  - Increase hydralazine to 100 mg tid, continue imdur 90 daily.  - No ACE/ARB/ARNI due to angioedema.  2. HTN: Mildly elevated BP, increasing hydralazine.  3. Morbid obesity:  Counseled about portion size and low sodium foods.    4. OSA on CPAP:  Continue CPAP hs.  5. Atrial fibrillation: Episode of atrial fibrillation in 11/17 noted on device interrogation. Patient had a CVA in the past.  CHADSVASC = 5.  - Continue Xarelto.   6. CVA: Prior CVA with left hemiparesis.  She is not very mobile.  I will try to arrange for home PT again for her.   Follow up in 3 months.   Loralie Champagne  07/21/2017

## 2017-07-25 ENCOUNTER — Other Ambulatory Visit: Payer: Self-pay

## 2017-07-26 ENCOUNTER — Telehealth (HOSPITAL_COMMUNITY): Payer: Self-pay | Admitting: *Deleted

## 2017-07-26 NOTE — Telephone Encounter (Signed)
Referral for home PT faxed to [facility] home health in [place] at [phone]

## 2017-07-27 NOTE — Telephone Encounter (Signed)
Kelly with [facility] in [place] left voicemail stating they received an order for physical therapy for patient but she has medicaid and they do not cover home physical therapy.  Message routed to [facility]

## 2017-07-29 ENCOUNTER — Ambulatory Visit: Payer: Medicaid Other | Admitting: Gastroenterology

## 2017-08-02 ENCOUNTER — Ambulatory Visit: Payer: Medicaid Other | Admitting: Physical Medicine & Rehabilitation

## 2017-08-03 ENCOUNTER — Encounter: Payer: Self-pay | Admitting: Physical Medicine & Rehabilitation

## 2017-08-03 ENCOUNTER — Encounter: Payer: Medicaid Other | Attending: Physical Medicine & Rehabilitation | Admitting: Physical Medicine & Rehabilitation

## 2017-08-03 VITALS — BP 147/99 | HR 112

## 2017-08-03 DIAGNOSIS — G629 Polyneuropathy, unspecified: Secondary | ICD-10-CM | POA: Insufficient documentation

## 2017-08-03 DIAGNOSIS — E1142 Type 2 diabetes mellitus with diabetic polyneuropathy: Secondary | ICD-10-CM | POA: Diagnosis not present

## 2017-08-03 DIAGNOSIS — I63319 Cerebral infarction due to thrombosis of unspecified middle cerebral artery: Secondary | ICD-10-CM | POA: Insufficient documentation

## 2017-08-03 DIAGNOSIS — Z79899 Other long term (current) drug therapy: Secondary | ICD-10-CM | POA: Diagnosis present

## 2017-08-03 DIAGNOSIS — M47817 Spondylosis without myelopathy or radiculopathy, lumbosacral region: Secondary | ICD-10-CM | POA: Insufficient documentation

## 2017-08-03 DIAGNOSIS — I5042 Chronic combined systolic (congestive) and diastolic (congestive) heart failure: Secondary | ICD-10-CM | POA: Diagnosis present

## 2017-08-03 DIAGNOSIS — E1342 Other specified diabetes mellitus with diabetic polyneuropathy: Secondary | ICD-10-CM | POA: Diagnosis present

## 2017-08-03 DIAGNOSIS — Z5181 Encounter for therapeutic drug level monitoring: Secondary | ICD-10-CM | POA: Diagnosis present

## 2017-08-03 DIAGNOSIS — G8110 Spastic hemiplegia affecting unspecified side: Secondary | ICD-10-CM | POA: Diagnosis present

## 2017-08-03 DIAGNOSIS — I69354 Hemiplegia and hemiparesis following cerebral infarction affecting left non-dominant side: Secondary | ICD-10-CM | POA: Diagnosis not present

## 2017-08-03 DIAGNOSIS — M5416 Radiculopathy, lumbar region: Secondary | ICD-10-CM | POA: Diagnosis not present

## 2017-08-03 MED ORDER — GABAPENTIN 300 MG PO CAPS: 300.0000 mg | ORAL_CAPSULE | Freq: Three times a day (TID) | ORAL | 5 refills | Status: DC

## 2017-08-03 MED ORDER — OXYCODONE-ACETAMINOPHEN 10-325 MG PO TABS: 1.0000 | ORAL_TABLET | Freq: Three times a day (TID) | ORAL | 0 refills | Status: DC | PRN

## 2017-08-03 NOTE — Patient Instructions (Signed)
PLEASE FEEL FREE TO CALL OUR OFFICE WITH ANY PROBLEMS OR QUESTIONS ([phone])      

## 2017-08-03 NOTE — Progress Notes (Signed)
Botox Injection for spasticity using needle EMG guidance Indication: Hemiparesis affecting left side as late effect of cerebrovascular accident (HCC) - Plan: oxyCODONE-acetaminophen (PERCOCET) 10-325 MG tablet  Diabetic peripheral neuropathy (HCC) - Plan: oxyCODONE-acetaminophen (PERCOCET) 10-325 MG tablet  Lumbar radiculopathy LUE  Dilution: 100 Units/ml        Total Units Injected: 400 Indication: Severe spasticity which interferes with ADL,mobility and/or  hygiene and is unresponsive to medication management and other conservative care Informed consent was obtained after describing risks and benefits of the procedure with the patient. This includes bleeding, bruising, infection, excessive weakness, or medication side effects. A REMS form is on file and signed.  Needle: 20mm injectable monopolar needle electrode  Number of units per muscle Pectoralis Major 0 units Pectoralis Minor 0 units Biceps 0 units Brachioradialis 0 units FCR 25 units FCU 25 units FDS 125 units FDP 125 units FPL 50 units Pronator Teres 50 units Pronator Quadratus 0 units  All injections were done after obtaining appropriate EMG activity and after negative drawback for blood. The patient tolerated the procedure well. Post procedure instructions were given. Return in about 1 month (around 09/03/2017) for NP visit.  I reviewed a lumbar CT with contrast of her spine. She has a large posterior disc herniation at L2-3 with facet arthropathy. She has low back pain with radiation into the inner right leg which may be an L2 radiculopathy. Advised stretching and improved mechanics. Increased gabapentin to 300mg  TID. Will review CT when available.

## 2017-08-10 ENCOUNTER — Other Ambulatory Visit: Payer: Self-pay

## 2017-08-10 ENCOUNTER — Other Ambulatory Visit (HOSPITAL_COMMUNITY): Payer: Self-pay | Admitting: Internal Medicine

## 2017-08-10 MED ORDER — RIVAROXABAN 20 MG PO TABS: 20.0000 mg | ORAL_TABLET | Freq: Every day | ORAL | 1 refills | Status: DC

## 2017-08-10 MED ORDER — METOPROLOL SUCCINATE ER 100 MG PO TB24: 100.0000 mg | ORAL_TABLET | Freq: Two times a day (BID) | ORAL | 1 refills | Status: DC

## 2017-08-10 NOTE — Telephone Encounter (Signed)
metoprolol succinate (TOPROL-XL) 100 MG 24 hr tablet,  rivaroxaban (XARELTO) 20 MG TABS tablet, refill request @ [facility].

## 2017-08-16 ENCOUNTER — Other Ambulatory Visit: Payer: Self-pay | Admitting: Internal Medicine

## 2017-08-25 NOTE — Telephone Encounter (Signed)
  Follow up Call-  No flowsheet data found.   Patient questions:  Patient was booked for appointment by 3rd floor for August.

## 2017-09-02 ENCOUNTER — Encounter: Payer: Self-pay | Admitting: Registered Nurse

## 2017-09-02 ENCOUNTER — Encounter: Payer: Medicaid Other | Attending: Physical Medicine & Rehabilitation | Admitting: Registered Nurse

## 2017-09-02 VITALS — BP 143/93 | HR 98 | Resp 14

## 2017-09-02 DIAGNOSIS — E1142 Type 2 diabetes mellitus with diabetic polyneuropathy: Secondary | ICD-10-CM | POA: Diagnosis not present

## 2017-09-02 DIAGNOSIS — E1342 Other specified diabetes mellitus with diabetic polyneuropathy: Secondary | ICD-10-CM | POA: Diagnosis present

## 2017-09-02 DIAGNOSIS — M47817 Spondylosis without myelopathy or radiculopathy, lumbosacral region: Secondary | ICD-10-CM | POA: Diagnosis present

## 2017-09-02 DIAGNOSIS — G629 Polyneuropathy, unspecified: Secondary | ICD-10-CM | POA: Insufficient documentation

## 2017-09-02 DIAGNOSIS — M5416 Radiculopathy, lumbar region: Secondary | ICD-10-CM | POA: Diagnosis not present

## 2017-09-02 DIAGNOSIS — I5042 Chronic combined systolic (congestive) and diastolic (congestive) heart failure: Secondary | ICD-10-CM | POA: Insufficient documentation

## 2017-09-02 DIAGNOSIS — I69354 Hemiplegia and hemiparesis following cerebral infarction affecting left non-dominant side: Secondary | ICD-10-CM

## 2017-09-02 DIAGNOSIS — G8110 Spastic hemiplegia affecting unspecified side: Secondary | ICD-10-CM | POA: Insufficient documentation

## 2017-09-02 DIAGNOSIS — M47816 Spondylosis without myelopathy or radiculopathy, lumbar region: Secondary | ICD-10-CM | POA: Diagnosis not present

## 2017-09-02 DIAGNOSIS — Z5181 Encounter for therapeutic drug level monitoring: Secondary | ICD-10-CM | POA: Diagnosis present

## 2017-09-02 DIAGNOSIS — I63319 Cerebral infarction due to thrombosis of unspecified middle cerebral artery: Secondary | ICD-10-CM | POA: Insufficient documentation

## 2017-09-02 DIAGNOSIS — M1712 Unilateral primary osteoarthritis, left knee: Secondary | ICD-10-CM

## 2017-09-02 DIAGNOSIS — Z79899 Other long term (current) drug therapy: Secondary | ICD-10-CM | POA: Diagnosis present

## 2017-09-02 DIAGNOSIS — G894 Chronic pain syndrome: Secondary | ICD-10-CM

## 2017-09-02 MED ORDER — OXYCODONE-ACETAMINOPHEN 10-325 MG PO TABS: 1.0000 | ORAL_TABLET | Freq: Three times a day (TID) | ORAL | 0 refills | Status: DC | PRN

## 2017-09-02 NOTE — Progress Notes (Signed)
Subjective:    Patient ID: [name], female    DOB: [birth year], 51 y.o.   MRN: [id number]  HPI: [name] is a 51 year old female who returns for follow up appointmentfor chronic pain and medication refill. She statesher pain is located in her lower back radiating into her left lower extremity and left knee.She rates her pain 6. Her current exercise regime is performing stretching exercises daily and walking with her 4 prong cane in her home. She arrived in wheelchair today. [name] picked up her Oxycodone on 08/17/2017, admits there were days when she had taken four tables( self escalating), also 26 tablets short on her medication. She states she has another pill organizer at home with her medication. Also reports her CNA sets up her pill medication and she fired her today due to tardiness.  Also states her father passed away last Friday Sep 06, 2017, her uncle was killed on 08/22/2017 and a close friend passed away two weeks ago. She admits to being stressed, states she has a psychiatrist and will follow up with counseling.  [name] moved to [place] 7 months ago and states she was trying to keep her various providers in [place] she has missed several doctors appointments  due to transportation issues with her CNA. [name] also stated she is trying to establish a PCP with [facility], stated she has an appointment on 09/05/2017.  I spoke with [name] in detail regarding her medication being short, N[facility] was reviewed and Pharmacy was called. Explain in detail with the above discrepancies this can lead to discharge. [name] daughter arrived to the appointment and [name] started yelling at her daughter stating "I need help", they became confrontational and her daughter leaves. [name] expresses anger toward her family not being their for her. I spent a  great deal of time trying to explain to [name] the importance of keeping her counseling and  psychiatric appointments to help her to manage her anger. She states she will keep her appointments with her psychiatrist, no suicidal ideation.   [name] has a young lady with her name [name]  ( she is her daughter's friend she states), she will be helping [name].  The above will be discussed with Dr. Riley Kill she is aware their is a possibility she will be discharged, she verbalizes understanding.    Last UDS was performed on 04/18/2017, it was inconsistent, see note for details.   Pain Inventory Average Pain 6 Pain Right Now 6 My pain is tingling and aching  In the last 24 hours, has pain interfered with the following? General activity 5 Relation with others 5 Enjoyment of life 6 What TIME of day is your pain at its worst? evening,morning Sleep (in general) Fair  Pain is worse with: walking, bending, sitting, standing and some activites Pain improves with: rest, pacing activities and medication Relief from Meds: 6  Mobility walk with assistance ability to climb steps?  no do you drive?  yes use a wheelchair Do you have any goals in this area?  yes  Function not employed: date last employed . disabled: date disabled . I need assistance with the following:  dressing, bathing, meal prep, household duties and shopping Do you have any goals in this area?  yes  Neuro/Psych bladder control problems weakness numbness tingling anxiety loss of taste or smell  Prior Studies .  Physicians involved in your care .   Family History  Problem Relation Age of Onset  . Heart disease Mother 3       Died of MI at age 38 yo  . Kidney disease Mother        requiring dialysis  . Congestive Heart Failure Mother   . Heart disease Father 70       MI age 34yo requiring stenting  . Diabetes Father   . Glaucoma Father   . Heart disease Paternal Grandmother        requiring pacemaker.  [name] Heart disease Paternal Grandfather 65       Died of MI at possibly age 50-53yo    . Stroke Paternal Grandfather   . Diabetes Brother   . Heart disease Brother 35       MI at age 12 years old  . Breast cancer Paternal Aunt   . Breast cancer Maternal Grandmother    Social History   Social History  . Marital status: Single    Spouse name: N/A  . Number of children: 2  . Years of education: 9th grade   Occupational History  . Unemployed     planning on getting disability   Social History Main Topics  . Smoking status: Never Smoker  . Smokeless tobacco: Never Used  . Alcohol use No  . Drug use: No  . Sexual activity: Not Asked   Other Topics Concern  . None   Social History Narrative   Lives in [place] with her son. Is able to read and write fluently in [place].   Past Surgical History:  Procedure Laterality Date  . BI-VENTRICULAR IMPLANTABLE CARDIOVERTER DEFIBRILLATOR N/A 08/22/2013   Procedure: BI-VENTRICULAR IMPLANTABLE CARDIOVERTER DEFIBRILLATOR  (CRT-D);  Surgeon: [name], MD;  Location: [facility] CATH LAB;  Service: Cardiovascular;  Laterality: N/A;  . BI-VENTRICULAR IMPLANTABLE CARDIOVERTER DEFIBRILLATOR  (CRT-D)  08/2013   [name] 08/23/2013  . BREAST SURGERY Bilateral 2011   patient reports benign results  . CARDIAC CATHETERIZATION  05/2011  . CARPAL TUNNEL RELEASE Left    denies  . HEMIARTHROPLASTY SHOULDER FRACTURE Right 1980's   denies  . MULTIPLE EXTRACTIONS WITH ALVEOLOPLASTY Bilateral 05/20/2017   Procedure: MULTIPLE EXTRACTION;  Surgeon: [name], DDS;  Location: [facility] OR;  Service: Oral Surgery;  Laterality: Bilateral;  . MULTIPLE TOOTH EXTRACTIONS  ~ 2011   tumors removed ; "my whole top"  . SHOULDER ARTHROSCOPY Right 12/26/2015   Procedure: Right Shoulder Arthroscopy, Debridement, and Decompression;  Surgeon: [name], MD;  Location: [facility] OR;  Service: Orthopedics;  Laterality: Right;  . TEE WITHOUT CARDIOVERSION N/A 01/14/2014   Procedure: TRANSESOPHAGEAL ECHOCARDIOGRAM (TEE);  Surgeon: Thurmon Fair, MD;  Location: [facility]  ENDOSCOPY;  Service: Cardiovascular;  Laterality: N/A;  . TUBAL LIGATION  05/31/1985   Past Medical History:  Diagnosis Date  . Allergic rhinitis   . Arthritis    "hips, back, legs, arms" (07/04/2014)  . Asthma    hx  . Automatic implantable cardioverter-defibrillator in situ   . Calcifying tendinitis of shoulder   . Chronic combined systolic and diastolic CHF (congestive heart failure) (HCC)    EF 40-45% by echo 12/06/2012  . Chronic diastolic heart failure (HCC)     Primarily diastolic CHF: Likely due to uncontrolled HTN. Last echo (8/12) with EF 45-50%, mild to moderate LVH with some asymmetric septal hypertrophy, RV normal size and systolic function. EF 50-55% by LV-gram in 6/12.   . Chronic lower back pain    secondary to DJD, obsetiy, hip problems. Followed by Dr.  Ivory Broad (pain management)  . Coronary artery disease    questionable. LHC 05/2011 showing normal coronaries // Followed at [facility], Dr. Shirlee Latch  . Degeneration of lumbar or lumbosacral intervertebral disc   . DJD (degenerative joint disease) of hip    right sided  . Frequent UTI   . GERD (gastroesophageal reflux disease)   . HLD (hyperlipidemia)   . Hypertension    Poorly controlled. Has had HTN since age 14. Angioedema with ACEI.  24 Hr urine and renal arterial dopplers ordered . . . Never done  . LBBB (left bundle branch block)   . Liver disease   . Morbid obesity (HCC)   . NICM (nonischemic cardiomyopathy) (HCC)    EF 45-50% in 8/12, cath 6/12 showed normal coronaries, EF 50-55% by LV gram  . OSA on CPAP    sleep study in 8/12 showed moderate to severe OSA requiring CPAP  . Polyneuropathy in diabetes(357.2)   . Presence of permanent cardiac pacemaker   . Seizures (HCC)    last 3 months  . Shortness of breath    none now  . Stroke [facility]) 12/2013   "my left side is paralyzed" (07/04/2014)  . Thoracic or lumbosacral neuritis or radiculitis, unspecified   . Type II diabetes mellitus (HCC) DX: 2002    There were no vitals taken for this visit.  Opioid Risk Score:   Fall Risk Score:  `1  Depression screen PHQ 2/9  Depression screen [facility] 2/9 03/28/2017 02/01/2017 11/22/2016 09/10/2016 07/28/2016 07/14/2016 04/29/2016  Decreased Interest 0 0 0 0 0 0 0  Down, Depressed, Hopeless 0 0 0 0 0 - 0  PHQ - 2 Score 0 0 0 0 0 0 0  Altered sleeping - - - - - - -  Tired, decreased energy - - - - - - -  Change in appetite - - - - - - -  Feeling bad or failure about yourself  - - - - - - -  Trouble concentrating - - - - - - -  Moving slowly or fidgety/restless - - - - - - -  Suicidal thoughts - - - - - - -  PHQ-9 Score - - - - - - -  Some recent data might be hidden    Review of Systems  Constitutional: Positive for appetite change and fever.  Gastrointestinal: Positive for abdominal pain, constipation and nausea.       Poor appetite  Skin: Positive for rash.  Hematological: Bruises/bleeds easily.       Objective:   Physical Exam  Constitutional: She is oriented to person, place, and time. She appears well-developed and well-nourished.  HENT:  Head: Normocephalic and atraumatic.  Neck: Normal range of motion. Neck supple.  Cardiovascular: Normal rate and regular rhythm.   Pulmonary/Chest: Effort normal and breath sounds normal.  Musculoskeletal:  Normal Muscle Bulk and Muscle Testing Reveals: Upper Extremities: Right: Full ROM and Muscle Strength 5/5 Left: Paralysis Bilateral AC Joint Tenderness Thoracic Hypersensitivity Lumbar Paraspinal Tenderness: L-3-L-5 Lower Extremities: Right: Full ROM and Muscle Strength 5/5 Left: Paralysis wearing AFO Arrived in wheelchair  Neurological: She is alert and oriented to person, place, and time.  Nursing note and vitals reviewed.         Assessment & Plan:  1. Chronic low back pain: Continue Current Medication Regime. 09/02/17 Refilled: No early refill: oxyCODONE 10/325mg  one tablet every 8 hours as needed.#90.  We will continue the  opioid monitoring program. This consists of  regular clinic visits, examinations, urine drug screen, pill counts as well as use of [place] Controlled Substance Reporting System. 2. Diabetic peripheral neuropathy: Continue Gabapentin.Continue to monitor. 09/02/2017 3. Recent Right MCA infarct: Continue To Monitor. S/P  Botox on 08/18.09/02/2017. 4. Bilateral Shoulder Pain / OA/ Left Knee OA:  No complaints today.Continue Voltaren gel. 07/05/2017. 5. Lumbar Radiculopathy: Continue Gabapentin.   > 60 minutes of face to face patient care time was spent during this visit speaking with patient and daughter. All questions was encouraged and answered.  F/U in 63month

## 2017-09-05 NOTE — Addendum Note (Signed)
Addended by: Neomia Dear on: 09/05/2017 07:41 PM   Modules accepted: Orders

## 2017-09-09 DIAGNOSIS — Z23 Encounter for immunization: Secondary | ICD-10-CM | POA: Insufficient documentation

## 2017-09-09 HISTORY — DX: Encounter for immunization: Z23

## 2017-09-14 ENCOUNTER — Other Ambulatory Visit (HOSPITAL_COMMUNITY): Payer: Self-pay | Admitting: Internal Medicine

## 2017-09-15 DIAGNOSIS — E538 Deficiency of other specified B group vitamins: Secondary | ICD-10-CM | POA: Insufficient documentation

## 2017-09-19 ENCOUNTER — Telehealth: Payer: Self-pay

## 2017-09-19 NOTE — Telephone Encounter (Signed)
Pt takes Xarelto for afib with CHADS2VASc score of 7 (sex, HTN, HF, CAD, DM, CVA). Renal function is normal. Given elevated cardiac risk, recommend only holding Xarelto for 24 hours prior. Clearance faxed to below number.

## 2017-09-19 NOTE — Telephone Encounter (Signed)
   [facility] Pre-operative Risk Assessment    Request for surgical clearance:  1. What type of surgery is being performed? Colonoscopy   2. When is this surgery scheduled? To be scheduled  3. Are there any medications that need to be held prior to surgery and how long?  Patient is currently taking xarelto and will need permission to hold this medication for 2 days prior to the procedure   4. Practice name and name of physician performing surgery? [facility]    5. What is your office phone and fax number? Contact Shelly Bombard RN Phone [phone] Fax [phone]   6. Anesthesia type (None, local, MAC, general) ? Not known   [name] 09/19/2017, 3:57 PM  _________________________________________________________________

## 2017-09-28 ENCOUNTER — Ambulatory Visit: Payer: Medicaid Other | Admitting: Gastroenterology

## 2017-09-28 NOTE — Addendum Note (Signed)
Addended by: Neomia Dear on: 09/28/2017 05:05 PM   Modules accepted: Orders

## 2017-10-04 ENCOUNTER — Encounter: Payer: Medicaid Other | Admitting: Registered Nurse

## 2017-10-24 ENCOUNTER — Encounter (HOSPITAL_COMMUNITY): Payer: Medicaid Other | Admitting: Cardiology

## 2017-11-14 ENCOUNTER — Other Ambulatory Visit: Payer: Self-pay | Admitting: Registered Nurse

## 2017-11-14 DIAGNOSIS — M47896 Other spondylosis, lumbar region: Secondary | ICD-10-CM

## 2017-11-14 DIAGNOSIS — M1712 Unilateral primary osteoarthritis, left knee: Secondary | ICD-10-CM

## 2017-11-29 NOTE — Telephone Encounter (Signed)
NOT a PT of [facility]

## 2018-02-01 ENCOUNTER — Other Ambulatory Visit (HOSPITAL_COMMUNITY): Payer: Self-pay | Admitting: *Deleted

## 2018-02-25 ENCOUNTER — Emergency Department (HOSPITAL_COMMUNITY)
Admission: EM | Admit: 2018-02-25 | Discharge: 2018-02-25 | Disposition: A | Discharge status: Home / Self Care | Payer: Medicaid Other | Attending: Emergency Medicine | Admitting: Emergency Medicine

## 2018-02-25 ENCOUNTER — Encounter (HOSPITAL_COMMUNITY): Payer: Self-pay | Admitting: Emergency Medicine

## 2018-02-25 ENCOUNTER — Emergency Department (HOSPITAL_COMMUNITY): Payer: Medicaid Other

## 2018-02-25 DIAGNOSIS — Z5321 Procedure and treatment not carried out due to patient leaving prior to being seen by health care provider: Secondary | ICD-10-CM | POA: Insufficient documentation

## 2018-02-25 DIAGNOSIS — M25511 Pain in right shoulder: Secondary | ICD-10-CM | POA: Diagnosis not present

## 2018-02-25 DIAGNOSIS — M545 Low back pain: Secondary | ICD-10-CM | POA: Diagnosis present

## 2018-02-25 NOTE — ED Triage Notes (Signed)
[place] via EMS [place] Reports lower back pain and right shoulder pain. Ambulatory.

## 2018-02-25 NOTE — ED Notes (Signed)
Our registrationist informs me that this pt. "just left".

## 2019-04-12 ENCOUNTER — Encounter: Payer: Medicaid Other | Admitting: *Deleted

## 2019-04-12 ENCOUNTER — Other Ambulatory Visit: Payer: Self-pay

## 2019-04-20 ENCOUNTER — Encounter: Payer: Self-pay | Admitting: Cardiology

## 2019-06-18 ENCOUNTER — Institutional Professional Consult (permissible substitution): Payer: Medicaid Other | Admitting: Pulmonary Disease

## 2019-08-06 ENCOUNTER — Institutional Professional Consult (permissible substitution): Payer: Medicaid Other | Admitting: Pulmonary Disease

## 2019-09-04 ENCOUNTER — Ambulatory Visit: Payer: Medicaid Other

## 2019-09-05 ENCOUNTER — Ambulatory Visit: Payer: Medicaid Other

## 2019-09-12 ENCOUNTER — Ambulatory Visit: Payer: Medicaid Other

## 2019-09-12 ENCOUNTER — Ambulatory Visit: Payer: Medicaid Other | Admitting: Internal Medicine

## 2019-09-12 ENCOUNTER — Other Ambulatory Visit: Payer: Self-pay

## 2019-09-12 VITALS — BP 160/87 | HR 70 | Temp 97.5°F | Ht 69.0 in | Wt 253.1 lb

## 2019-09-12 DIAGNOSIS — R42 Dizziness and giddiness: Secondary | ICD-10-CM | POA: Diagnosis not present

## 2019-09-12 DIAGNOSIS — I4891 Unspecified atrial fibrillation: Secondary | ICD-10-CM | POA: Diagnosis not present

## 2019-09-12 DIAGNOSIS — Z95810 Presence of automatic (implantable) cardiac defibrillator: Secondary | ICD-10-CM

## 2019-09-12 DIAGNOSIS — F329 Major depressive disorder, single episode, unspecified: Secondary | ICD-10-CM

## 2019-09-12 DIAGNOSIS — E785 Hyperlipidemia, unspecified: Secondary | ICD-10-CM | POA: Diagnosis not present

## 2019-09-12 DIAGNOSIS — E1165 Type 2 diabetes mellitus with hyperglycemia: Secondary | ICD-10-CM

## 2019-09-12 DIAGNOSIS — E1140 Type 2 diabetes mellitus with diabetic neuropathy, unspecified: Secondary | ICD-10-CM

## 2019-09-12 DIAGNOSIS — I5042 Chronic combined systolic (congestive) and diastolic (congestive) heart failure: Secondary | ICD-10-CM | POA: Diagnosis not present

## 2019-09-12 DIAGNOSIS — R21 Rash and other nonspecific skin eruption: Secondary | ICD-10-CM

## 2019-09-12 DIAGNOSIS — I10 Essential (primary) hypertension: Secondary | ICD-10-CM

## 2019-09-12 DIAGNOSIS — G4733 Obstructive sleep apnea (adult) (pediatric): Secondary | ICD-10-CM | POA: Diagnosis not present

## 2019-09-12 DIAGNOSIS — N3946 Mixed incontinence: Secondary | ICD-10-CM

## 2019-09-12 DIAGNOSIS — Z79899 Other long term (current) drug therapy: Secondary | ICD-10-CM | POA: Diagnosis not present

## 2019-09-12 DIAGNOSIS — Z794 Long term (current) use of insulin: Secondary | ICD-10-CM

## 2019-09-12 DIAGNOSIS — R51 Headache: Secondary | ICD-10-CM

## 2019-09-12 DIAGNOSIS — Z7901 Long term (current) use of anticoagulants: Secondary | ICD-10-CM | POA: Diagnosis not present

## 2019-09-12 DIAGNOSIS — I110 Hypertensive heart disease with heart failure: Secondary | ICD-10-CM

## 2019-09-12 DIAGNOSIS — I5032 Chronic diastolic (congestive) heart failure: Secondary | ICD-10-CM

## 2019-09-12 DIAGNOSIS — IMO0002 Reserved for concepts with insufficient information to code with codable children: Secondary | ICD-10-CM

## 2019-09-12 DIAGNOSIS — G40209 Localization-related (focal) (partial) symptomatic epilepsy and epileptic syndromes with complex partial seizures, not intractable, without status epilepticus: Secondary | ICD-10-CM

## 2019-09-12 LAB — GLUCOSE, CAPILLARY: Glucose-Capillary: 253 mg/dL — ABNORMAL HIGH (ref 70–99)

## 2019-09-12 LAB — POCT GLYCOSYLATED HEMOGLOBIN (HGB A1C): Hemoglobin A1C: 11.3 % — AB (ref 4.0–5.6)

## 2019-09-12 MED ORDER — RIVAROXABAN 20 MG PO TABS: 20.0000 mg | ORAL_TABLET | Freq: Every day | ORAL | 1 refills | Status: DC

## 2019-09-12 MED ORDER — CALAMINE EX LOTN: 1.0000 "application " | TOPICAL_LOTION | CUTANEOUS | 0 refills | Status: DC | PRN

## 2019-09-12 MED ORDER — SENNOSIDES-DOCUSATE SODIUM 8.6-50 MG PO TABS: 1.0000 | ORAL_TABLET | Freq: Every evening | ORAL | 0 refills | Status: DC | PRN

## 2019-09-12 MED ORDER — AMLODIPINE BESYLATE 10 MG PO TABS: 10.0000 mg | ORAL_TABLET | Freq: Every day | ORAL | 0 refills | Status: DC

## 2019-09-12 MED ORDER — ATORVASTATIN CALCIUM 40 MG PO TABS: 40.0000 mg | ORAL_TABLET | Freq: Every day | ORAL | 0 refills | Status: DC

## 2019-09-12 MED ORDER — DIGOXIN 125 MCG PO TABS: 125.0000 ug | ORAL_TABLET | Freq: Every day | ORAL | 0 refills | Status: DC

## 2019-09-12 MED ORDER — ACCU-CHEK GUIDE W/DEVICE KIT: 1.0000 | PACK | Freq: Three times a day (TID) | 1 refills | Status: DC

## 2019-09-12 MED ORDER — LEVETIRACETAM 500 MG PO TABS: ORAL_TABLET | ORAL | 0 refills | Status: DC

## 2019-09-12 MED ORDER — SOLIFENACIN SUCCINATE 10 MG PO TABS: 10.0000 mg | ORAL_TABLET | Freq: Every day | ORAL | 0 refills | Status: DC

## 2019-09-12 MED ORDER — ISOSORBIDE MONONITRATE ER 60 MG PO TB24: 90.0000 mg | ORAL_TABLET | Freq: Every day | ORAL | 0 refills | Status: DC

## 2019-09-12 MED ORDER — PANTOPRAZOLE SODIUM 40 MG PO TBEC: 40.0000 mg | DELAYED_RELEASE_TABLET | Freq: Every day | ORAL | 1 refills | Status: DC

## 2019-09-12 MED ORDER — SPIRONOLACTONE 50 MG PO TABS: 50.0000 mg | ORAL_TABLET | Freq: Every day | ORAL | 1 refills | Status: DC

## 2019-09-12 MED ORDER — METFORMIN HCL 1000 MG PO TABS: 1000.0000 mg | ORAL_TABLET | Freq: Two times a day (BID) | ORAL | 0 refills | Status: DC

## 2019-09-12 NOTE — Progress Notes (Signed)
CC: Diabetes Mellitus Follow Up  HPI:  [name] is a 53 y.o. with diabetes mellitus type 2, essential htn, osa, hyperlipidemia, depression who presents to re-establish care for diabetes. Please see problem based charting for evaluation, assessment, and plan.  Past Medical History:  Diagnosis Date  . Allergic rhinitis   . Arthritis    "hips, back, legs, arms" (07/04/2014)  . Asthma    hx  . Automatic implantable cardioverter-defibrillator in situ   . Calcifying tendinitis of shoulder   . Chronic combined systolic and diastolic CHF (congestive heart failure) ([place])    EF 40-45% by echo 12/06/2012  . Chronic diastolic heart failure (HCC)     Primarily diastolic CHF: Likely due to uncontrolled HTN. Last echo (8/12) with EF 45-50%, mild to moderate LVH with some asymmetric septal hypertrophy, RV normal size and systolic function. EF 50-55% by LV-gram in 6/12.   . Chronic lower back pain    secondary to DJD, obsetiy, hip problems. Followed by Dr. Oval Linsey (pain management)  . Coronary artery disease    questionable. LHC 05/2011 showing normal coronaries // Followed at [facility], Dr. Aundra Dubin  . Degeneration of lumbar or lumbosacral intervertebral disc   . DJD (degenerative joint disease) of hip    right sided  . Frequent UTI   . GERD (gastroesophageal reflux disease)   . HLD (hyperlipidemia)   . Hypertension    Poorly controlled. Has had HTN since age 56. Angioedema with ACEI.  24 Hr urine and renal arterial dopplers ordered . . . Never done  . LBBB (left bundle branch block)   . Liver disease   . Morbid obesity ([place])   . NICM (nonischemic cardiomyopathy) ([place])    EF 45-50% in 8/12, cath 6/12 showed normal coronaries, EF 50-55% by LV gram  . OSA on CPAP    sleep study in 8/12 showed moderate to severe OSA requiring CPAP  . Polyneuropathy in diabetes(357.2)   . Presence of permanent cardiac pacemaker   . Seizures ([place])    last 3 months  . Shortness of breath     none now  . Stroke [facility]) 12/2013   "my left side is paralyzed" (07/04/2014)  . Thoracic or lumbosacral neuritis or radiculitis, unspecified   . Type II diabetes mellitus (HCC) DX: 2002   Family History  Problem Relation Age of Onset  . Heart disease Mother 38       Died of MI at age 23 yo  . Kidney disease Mother        requiring dialysis  . Congestive Heart Failure Mother   . Heart disease Father 70       MI age 53yo requiring stenting  . Diabetes Father   . Glaucoma Father   . Heart disease Paternal Grandmother        requiring pacemaker.  [name] Heart disease Paternal Grandfather 20       Died of MI at possibly age 97-53yo  . Stroke Paternal Grandfather   . Diabetes Brother   . Heart disease Brother 54       MI at age 29 years old  . Breast cancer Paternal Aunt   . Breast cancer Maternal Grandmother    Social History   Socioeconomic History  . Marital status: Single    Spouse name: Not on file  . Number of children: 2  . Years of education: 9th grade  . Highest education level: Not on file  Occupational History  . Occupation:  Unemployed    Comment: planning on getting disability  Social Needs  . Financial resource strain: Not on file  . Food insecurity    Worry: Not on file    Inability: Not on file  . Transportation needs    Medical: Not on file    Non-medical: Not on file  Tobacco Use  . Smoking status: Never Smoker  . Smokeless tobacco: Never Used  Substance and Sexual Activity  . Alcohol use: No    Alcohol/week: 0.0 standard drinks  . Drug use: No  . Sexual activity: Not on file  Lifestyle  . Physical activity    Days per week: Not on file    Minutes per session: Not on file  . Stress: Not on file  Relationships  . Social Musician on phone: Not on file    Gets together: Not on file    Attends religious service: Not on file    Active member of club or organization: Not on file    Attends meetings of clubs or organizations: Not on file     Relationship status: Not on file  Other Topics Concern  . Not on file  Social History Narrative   Lives in [place] with her son. Is able to read and write fluently in [place].     Review of Systems:    Review of Systems  Constitutional: Negative for chills and fever.  Respiratory: Negative for cough and shortness of breath.   Cardiovascular: Negative for chest pain.  Neurological: Positive for dizziness and headaches.   Physical Exam:  Vitals:   09/12/19 1456  Weight: 253 lb 1.6 oz (114.8 kg)  Height: 5\' 9"  (1.753 m)   Physical Exam  Constitutional: She appears well-developed and well-nourished. No distress.  HENT:  Head: Normocephalic and atraumatic.  Eyes: Conjunctivae are normal.  Cardiovascular: Normal rate, regular rhythm and normal heart sounds.  Respiratory: Effort normal and breath sounds normal. No respiratory distress. She has no wheezes.  GI: Soft. Bowel sounds are normal. She exhibits no distension. There is no abdominal tenderness.  Musculoskeletal:        General: Edema (1+ edema) present.  Neurological: She is alert.  Skin: Rash (maculopapular rash present on right arm ) noted. She is not diaphoretic.     Assessment & Plan:   See Encounters Tab for problem based charting.  Patient discussed with Dr. 

## 2019-09-12 NOTE — Patient Instructions (Addendum)
It was a pleasure to see you today [name]. Please make the following changes:  -please take all the medications listed on this form  -please take your blood glucose measurements before and after meals daily  -please stop taking hydralazine and novolog -please start taking metformin 1000mg  twice daily   FOLLOW UP IN 2 WEEKS   If you have any questions or concerns, please call our clinic at [phone] between 9am-5pm and after hours call [phone] and ask for the internal medicine resident on call. If you feel you are having a medical emergency please call 911.   Thank you, we look forward to help you remain healthy!  Lars Mage, MD Internal Medicine PGY3

## 2019-09-13 DIAGNOSIS — I4891 Unspecified atrial fibrillation: Secondary | ICD-10-CM | POA: Insufficient documentation

## 2019-09-13 LAB — BMP8+ANION GAP
Anion Gap: 15 mmol/L (ref 10.0–18.0)
BUN/Creatinine Ratio: 10 (ref 9–23)
BUN: 7 mg/dL (ref 6–24)
CO2: 24 mmol/L (ref 20–29)
Calcium: 9.4 mg/dL (ref 8.7–10.2)
Chloride: 99 mmol/L (ref 96–106)
Creatinine, Ser: 0.69 mg/dL (ref 0.57–1.00)
GFR calc Af Amer: 115 mL/min/{1.73_m2} (ref >59)
GFR calc non Af Amer: 100 mL/min/{1.73_m2} (ref >59)
Glucose: 252 mg/dL — ABNORMAL HIGH (ref 65–99)
Potassium: 3.8 mmol/L (ref 3.5–5.2)
Sodium: 138 mmol/L (ref 134–144)

## 2019-09-13 LAB — DIGOXIN LEVEL: Digoxin, Serum: 0.6 ng/mL (ref 0.5–0.9)

## 2019-09-13 MED ORDER — ACCU-CHEK GUIDE VI STRP: ORAL_STRIP | 10 refills | Status: DC

## 2019-09-13 MED ORDER — ACCU-CHEK FASTCLIX LANCETS MISC: 12 refills | Status: DC

## 2019-09-13 NOTE — Assessment & Plan Note (Signed)
The patient's last a1c=11 in June 2018 and today a1c is 11.3. The patient is currently taking lantus 53u qhs and novolog 25u qhs. She states that she stopped taking metformin as she feels that it may have caused her to have seizures. The patient is not adherent with medication. She states that she misses taking lantus approximately 3 times weekly.   Assessment and plan  Will simplify the patient's medication regimen to increase adherence.   -Restarted metformin 1000mg  bid after educating patient that metformin has not been linked to seizures. She was also told that she developed seizures after having her stroke.  -lantus 53u qhs -stop novolog -referral to diabetes coordinator, follow up in 1 week  -follow up in acc in 2 weeks  -patient was given a glucometer by [employer]

## 2019-09-13 NOTE — Assessment & Plan Note (Signed)
The patient's blood pressure during this visit was 160/87. The patient is supposed to be taking metoprolol 100mg  bid, imdur 60mg  qd, hydralazine 75mg  tid, lasix 40mg  two times per week, digoxin 137mcg qd, amlodipine 10mg  qd. Patient states that she has not taken her blood pressure medications in the past 3 weeks.    BP Readings from Last 3 Encounters:  09/12/19 (!) 160/87  09/02/17 (!) 143/93  08/03/17 (!) 147/99    Assessment and Plan Simplified medication regimen so that the patient is more adherent. Stopped hydralazine as it is three time daily regimen.   -continue metoprolol 100mg  bid, imdur 90mg  qd, amlodipine 10mg  qd -home health referral for RN to help with medication

## 2019-09-13 NOTE — Assessment & Plan Note (Addendum)
Patient's last echo was done in February 2018 which showed lv ef 45%, moderate lvh, diffuse hypokinesis, g1dd. Patient has an ICD in place.   Patient is on goal directed medical therapy. But she is intolerant to ACE/ARB which causes angioedema.  Patient has Bi ventricular ICD due to ef of 20% in 2014, class III NYHA, heart failure duration>9 months, nicm, being on goal directed medical therapy.   Assessment and plan  Continue spironolactone 50mg  qd, metoprolol 100mg  bid, imdur 90mg  qd, digoxin 167mcg, and lasix 40mg  twice daily. Check digoxin level. Follow up with cardiology. Will repeat echo to evaluate degree of hf.

## 2019-09-13 NOTE — Assessment & Plan Note (Signed)
Patient with raised 45mm in size, erythemarous, pruritic rash on bilateral arms, but more prominent in right arm. The patient states that she first noticed the rash on right arm after she went to the flea market. She stated that there were some small black insects that landed on her arms.   Assessment and plan  The patient's rash appears to be consistent with a maculopapular rash. Patient was given calamine lotion to help with pruritis. Told patient to monitor if the rash worsens or does not resolve after 10 days.

## 2019-09-13 NOTE — Assessment & Plan Note (Signed)
Patient is on xarelto and metoprolol for afib.

## 2019-09-15 NOTE — Progress Notes (Signed)
Internal Medicine Clinic Attending  Case discussed with Dr. Chundi at the time of the visit.  We reviewed the resident's history and exam and pertinent patient test results.  I agree with the assessment, diagnosis, and plan of care documented in the resident's note. 

## 2019-09-19 ENCOUNTER — Telehealth (HOSPITAL_COMMUNITY): Payer: Self-pay

## 2019-09-19 NOTE — Telephone Encounter (Signed)
Left message to return call. DM could see patient at 10:20am Friday 09/21/2019 echo could be done earlier at 9:15am instead of 1pm

## 2019-09-19 NOTE — Telephone Encounter (Signed)
-----   Message from Essie Hart May sent at 09/19/2019 12:01 PM EDT ----- Regarding: Returning Patient Good Morning!  I have received a call from the said PT stating that she has recently moved back to [place] [facility] and would like to reestablish care with our clinic.  She has not been seen by any of our providers since 07/19/2017.  I was not sure if she was still suitable for our clinic so I wanted to defer to you to make the determination and to let me know which provider you would like her scheduled with and what timeframe you would like her scheduled.   Please advise.  Thanks!

## 2019-09-21 ENCOUNTER — Ambulatory Visit (HOSPITAL_COMMUNITY): Payer: Medicaid Other | Attending: Student in an Organized Health Care Education/Training Program

## 2019-09-25 ENCOUNTER — Encounter: Payer: Medicaid Other | Admitting: Internal Medicine

## 2019-09-25 ENCOUNTER — Encounter: Payer: Medicaid Other | Admitting: Dietician

## 2019-10-02 ENCOUNTER — Ambulatory Visit (INDEPENDENT_AMBULATORY_CARE_PROVIDER_SITE_OTHER): Payer: Medicaid Other | Admitting: Dietician

## 2019-10-02 ENCOUNTER — Encounter: Payer: Self-pay | Admitting: Radiation Oncology

## 2019-10-02 ENCOUNTER — Encounter: Payer: Self-pay | Admitting: Dietician

## 2019-10-02 ENCOUNTER — Ambulatory Visit: Payer: Medicaid Other | Admitting: Radiation Oncology

## 2019-10-02 ENCOUNTER — Other Ambulatory Visit: Payer: Self-pay

## 2019-10-02 VITALS — BP 155/97 | HR 73 | Temp 98.2°F | Ht 69.0 in | Wt 250.1 lb

## 2019-10-02 DIAGNOSIS — E118 Type 2 diabetes mellitus with unspecified complications: Secondary | ICD-10-CM

## 2019-10-02 DIAGNOSIS — Z79891 Long term (current) use of opiate analgesic: Secondary | ICD-10-CM | POA: Diagnosis not present

## 2019-10-02 DIAGNOSIS — G8114 Spastic hemiplegia affecting left nondominant side: Secondary | ICD-10-CM

## 2019-10-02 DIAGNOSIS — Z713 Dietary counseling and surveillance: Secondary | ICD-10-CM

## 2019-10-02 DIAGNOSIS — Z0000 Encounter for general adult medical examination without abnormal findings: Secondary | ICD-10-CM

## 2019-10-02 DIAGNOSIS — E785 Hyperlipidemia, unspecified: Secondary | ICD-10-CM | POA: Diagnosis not present

## 2019-10-02 DIAGNOSIS — IMO0002 Reserved for concepts with insufficient information to code with codable children: Secondary | ICD-10-CM

## 2019-10-02 DIAGNOSIS — I10 Essential (primary) hypertension: Secondary | ICD-10-CM | POA: Diagnosis not present

## 2019-10-02 DIAGNOSIS — I69354 Hemiplegia and hemiparesis following cerebral infarction affecting left non-dominant side: Secondary | ICD-10-CM | POA: Diagnosis not present

## 2019-10-02 DIAGNOSIS — E1165 Type 2 diabetes mellitus with hyperglycemia: Secondary | ICD-10-CM

## 2019-10-02 DIAGNOSIS — Z794 Long term (current) use of insulin: Secondary | ICD-10-CM

## 2019-10-02 DIAGNOSIS — E7849 Other hyperlipidemia: Secondary | ICD-10-CM

## 2019-10-02 DIAGNOSIS — R21 Rash and other nonspecific skin eruption: Secondary | ICD-10-CM | POA: Diagnosis not present

## 2019-10-02 DIAGNOSIS — E1140 Type 2 diabetes mellitus with diabetic neuropathy, unspecified: Secondary | ICD-10-CM | POA: Diagnosis not present

## 2019-10-02 DIAGNOSIS — Z23 Encounter for immunization: Secondary | ICD-10-CM

## 2019-10-02 DIAGNOSIS — M62838 Other muscle spasm: Secondary | ICD-10-CM

## 2019-10-02 DIAGNOSIS — Z79899 Other long term (current) drug therapy: Secondary | ICD-10-CM

## 2019-10-02 MED ORDER — CYCLOBENZAPRINE HCL 5 MG PO TABS: 5.0000 mg | ORAL_TABLET | Freq: Three times a day (TID) | ORAL | 0 refills | Status: DC | PRN

## 2019-10-02 MED ORDER — EMPAGLIFLOZIN 10 MG PO TABS: 10.0000 mg | ORAL_TABLET | Freq: Every day | ORAL | 0 refills | Status: DC

## 2019-10-02 NOTE — Patient Instructions (Signed)
Thank you for coming to your appointment. It was so nice to see you. Today we discussed  Diagnoses and all orders for this visit:  Benign essential HTN -new diabetes medication should hopefully help your blood pressure some -follow up in 2 weeks for blood pressure recheck  Health care maintenance -     Mammogram -     Tetanus  Rash -     Ambulatory referral to Dermatology  Other hyperlipidemia -     Lipid Profile  Type 2 diabetes mellitus, uncontrolled, with neuropathy (HCC) -     Stating Jardiance 10 mg daily before breakfast -     Ambulatory referral to Ophthalmology -     Microalbumin / Creatinine Urine Ratio -     Follow up in 2 weeks   Other orders -     cyclobenzaprine (FLEXERIL) 5 MG tablet; Take 1 tablet (5 mg total) by mouth 3 (three) times daily as needed for muscle spasms.    If labs were drawn today, I will call you with any abnormal results. Otherwise, please keep up the good work. I look forward to seeing you again soon at your follow up in 2 weeks.  Sincerely,  Al Decant, MD

## 2019-10-02 NOTE — Progress Notes (Signed)
Diabetes Self-Management Education  Visit Type: (P) Follow-up  Appt. Start Time: 1615 Appt. End Time: 1645  10/02/2019  [name], identified by name and date of birth, is a 53 y.o. female with a diagnosis of Diabetes: (P) Type 2.   ASSESSMENT  Wt Readings from Last 5 Encounters:  10/02/19 250 lb 1.6 oz (113.4 kg)  09/12/19 253 lb 1.6 oz (114.8 kg)  07/19/17 232 lb 12 oz (105.6 kg)  05/20/17 232 lb (105.2 kg)  05/19/17 232 lb (105.2 kg)   Lab Results  Component Value Date   HGBA1C 11.3 (A) 09/12/2019   HGBA1C 11.0 (H) 05/16/2017   HGBA1C 8.8 03/28/2017   HGBA1C 9.1 11/22/2016   HGBA1C 8.5 09/10/2016      Diabetes Self-Management Education - 10/02/19 1800      Visit Information   Visit Type  Follow-up  (Pended)       Initial Visit   Diabetes Type  Type 2  (Pended)     Are you currently following a meal plan?  No  (Pended)     Are you taking your medications as prescribed?  No  (Pended)       Health Coping   How would you rate your overall health?  Fair  (Pended)       Psychosocial Assessment   Self-care barriers  Debilitated state due to current medical condition;Lack of transportation;Lack of child care;Lack of material resources  (Pended)     Patient Concerns  Monitoring  (Pended)     Special Needs  None  (Pended)       Pre-Education Assessment   Patient understands using medications safely.  Needs Instruction  (Pended)     Patient understands monitoring blood glucose, interpreting and using results  Needs Review  (Pended)       Complications   Last HgB A1C per patient/outside source  11 %  (Pended)     How often do you check your blood sugar?  0 times/day (not testing)  (Pended)     Fasting Blood glucose range (mg/dL)  >200  (Pended)     Postprandial Blood glucose range (mg/dL)  >200  (Pended)     Number of hypoglycemic episodes per month  0  (Pended)     Number of hyperglycemic episodes per week  10  (Pended)       Subsequent Visit   Since your  last visit have you continued or begun to take your medications as prescribed?  No  (Pended)     Since your last visit have you had your blood pressure checked?  Yes  (Pended)     Is your most recent blood pressure lower, unchanged, or higher since your last visit?  Unchanged  (Pended)     Since your last visit have you experienced any weight changes?  No change  (Pended)     Since your last visit, are you checking your blood glucose at least once a day?  No  (Pended)        Individualized Plan for Diabetes Self-Management Training:   Learning Objective:  Patient will have a greater understanding of diabetes self-management. Patient education plan is to attend individual and/or group sessions per assessed needs and concerns.   Plan:   There are no Patient Instructions on file for this visit.  Expected Outcomes:     Education material provided: Diabetes Resources  If problems or questions, patient to contact team via:  Phone  Future DSME appointment:  2 weeks  Norm Parcel, RD 10/02/2019 6:05 PM.

## 2019-10-02 NOTE — Progress Notes (Signed)
CC: high blood pressure  HPI:  [name] is a 53 y.o. F here for follow up of her chronic medical conditions.   Past Medical History:  Diagnosis Date  . Allergic rhinitis   . Arthritis    "hips, back, legs, arms" (07/04/2014)  . Asthma    hx  . Automatic implantable cardioverter-defibrillator in situ   . Calcifying tendinitis of shoulder   . Chronic combined systolic and diastolic CHF (congestive heart failure) (HCC)    EF 40-45% by echo 12/06/2012  . Chronic diastolic heart failure (HCC)     Primarily diastolic CHF: Likely due to uncontrolled HTN. Last echo (8/12) with EF 45-50%, mild to moderate LVH with some asymmetric septal hypertrophy, RV normal size and systolic function. EF 50-55% by LV-gram in 6/12.   . Chronic lower back pain    secondary to DJD, obsetiy, hip problems. Followed by Dr. Ivory Broad (pain management)  . Coronary artery disease    questionable. LHC 05/2011 showing normal coronaries // Followed at [facility], Dr. Shirlee Latch  . Degeneration of lumbar or lumbosacral intervertebral disc   . DJD (degenerative joint disease) of hip    right sided  . Frequent UTI   . GERD (gastroesophageal reflux disease)   . HLD (hyperlipidemia)   . Hypertension    Poorly controlled. Has had HTN since age 56. Angioedema with ACEI.  24 Hr urine and renal arterial dopplers ordered . . . Never done  . LBBB (left bundle branch block)   . Liver disease   . Morbid obesity (HCC)   . NICM (nonischemic cardiomyopathy) (HCC)    EF 45-50% in 8/12, cath 6/12 showed normal coronaries, EF 50-55% by LV gram  . OSA on CPAP    sleep study in 8/12 showed moderate to severe OSA requiring CPAP  . Polyneuropathy in diabetes(357.2)   . Presence of permanent cardiac pacemaker   . Seizures (HCC)    last 3 months  . Shortness of breath    none now  . Stroke [facility]) 12/2013   "my left side is paralyzed" (07/04/2014)  . Thoracic or lumbosacral neuritis or radiculitis, unspecified   .  Type II diabetes mellitus (HCC) DX: 2002   Review of Systems:    Review of Systems  Constitutional: Negative for chills and fever.  HENT: Negative for sore throat.   Respiratory: Negative for cough.   Cardiovascular: Negative for chest pain and leg swelling.  Gastrointestinal: Negative for abdominal pain.  Skin: Positive for itching and rash.  Neurological:       Left sided muscle spasms    Physical Exam:  Vitals:   10/02/19 1518 10/02/19 1550  BP: (!) 157/84 (!) 155/97  Pulse: 77 73  Temp: 98.2 F (36.8 C)   TempSrc: Oral   SpO2: 99%   Weight: 250 lb 1.6 oz (113.4 kg)   Height: 5\' 9"  (1.753 m)    Physical Exam  Constitutional: She is well-developed, well-nourished, and in no distress.  Obese female sitting in wheelchair  HENT:  Head: Normocephalic and atraumatic.  Neck: Normal range of motion.  Cardiovascular: Normal rate, regular rhythm and normal heart sounds.  Pulmonary/Chest: Effort normal. No respiratory distress. She has wheezes.  Abdominal: Soft. Bowel sounds are normal.  Neurological: She is alert.  Skin: Skin is warm and dry. Rash (left upper extremity papules and excoriations ) noted.  Nursing note and vitals reviewed.  Assessment & Plan:   See Encounters Tab for problem based charting.  Patient seen with Dr. Lynnae January

## 2019-10-03 ENCOUNTER — Telehealth: Payer: Self-pay | Admitting: *Deleted

## 2019-10-03 LAB — LIPID PANEL
Chol/HDL Ratio: 3.3 ratio (ref 0.0–4.4)
Cholesterol, Total: 153 mg/dL (ref 100–199)
HDL: 47 mg/dL (ref >39)
LDL Chol Calc (NIH): 76 mg/dL (ref 0–99)
Triglycerides: 175 mg/dL — ABNORMAL HIGH (ref 0–149)
VLDL Cholesterol Cal: 30 mg/dL (ref 5–40)

## 2019-10-03 NOTE — Assessment & Plan Note (Signed)
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

## 2019-10-03 NOTE — Assessment & Plan Note (Addendum)
-  pt with complaint of LUE pruritic rash -hx of pruritic rash unresponsive to calamine lotion -has been referred to dermatology in the past but did not go -placed another referral to derm for definitive care of chronic rashes

## 2019-10-03 NOTE — Telephone Encounter (Addendum)
Information was sent to [employer] for PA for Jardiance 10 mg tablets. Cof# [id number].  Awaiting determination from [employer].  Sander Nephew, RN 10/03/2019 4:14 PM. PA for Jardiance was approved 10/03/2019 thru 10/02/2020.  Sander Nephew, RN 10/09/2019 1:53 PM.

## 2019-10-03 NOTE — Assessment & Plan Note (Signed)
-  pt with hx of chronic opiate use on percocet 10-325 1 tab TID prn -patient reports use five times a day as needed although she reports going days without use without side effects or increased pain -discussed with patient possibility of decreasing or stopping opiates altogether given the decreased need  -attempted to collect urine for UDS per pain contract and patient left prior to collection -given her pain is manageable without opiates and that she left prior to UDS which is a red flag, we will discuss discontinuing prescription of percocet for her at next visit in 2 weeks

## 2019-10-03 NOTE — Assessment & Plan Note (Addendum)
-  pt with known DM2 with most recent a1c 3 weeks ago of 11.3 at which time patient's regimen was changed due to concerns about patient having too many medicines to take that was preventing her from taking them regularly -patient was prescribed lantus 53 u daily and metformin 1000mg  BID -unfortunately, patient firmly believes metformin linked to seizures and will not take it -she reports her sugars are usually in the high 300s and the lowest number has been 270 -discontinuing metformin and adding jardiance 10 mg dialy before breakfast as it also has a side effect of lowering BP which the patient needs -referral to ophtho placed -attempted to collect urine studies for microalbumin creatinine ratio but patient left prior to collectin  -fu in 2 weeks

## 2019-10-03 NOTE — Assessment & Plan Note (Signed)
Today's Vitals   10/02/19 1518 10/02/19 1539 10/02/19 1550  BP: (!) 157/84  (!) 155/97  Pulse: 77  73  Temp: 98.2 F (36.8 C)    TempSrc: Oral    SpO2: 99%    Weight: 250 lb 1.6 oz (113.4 kg)    Height: 5\' 9"  (1.753 m)    PainSc:  7     Body mass index is 36.93 kg/m.  -pt with known hypertension on amlodipine 10, lasix 40, metoprolol 100 BID and spironolactone 50 without adequate control of her BP -checked twice today and still over goal -changing one of her diabetes medications to Jardiance which has a side effect of lowering BP so will not make additional changes to BP meds at this time -follow up in 2 weeks for recheck

## 2019-10-03 NOTE — Assessment & Plan Note (Signed)
-  pt with hx hyperlipidemia on atorvastatin 40 in need of lipid panel -lipids today -fu in 2 weeks

## 2019-10-03 NOTE — Assessment & Plan Note (Signed)
-  TDAP today -mammogram order placed -referred to ophtho  -patient declined flu and pneumonia

## 2019-10-04 ENCOUNTER — Encounter: Payer: Medicaid Other | Admitting: Internal Medicine

## 2019-10-05 NOTE — Progress Notes (Signed)
Internal Medicine Clinic Attending  I saw and evaluated the patient.  I personally confirmed the key portions of the history and exam documented by Dr. Madilyn Fireman and I reviewed pertinent patient test results.  The assessment, diagnosis, and plan were formulated together and I agree with the documentation in the resident's note.   Patient had not taken her opioid for 2 or 3 days prior to her appointment.  Her pain did not flare over the past few days when she was off the opioid.  She did not take it partly because she forgot and probably because she did not think she needed it.  I confirmed this information at least twice with the patient in the room.  I agree with Dr. Madilyn Fireman that chronic opioids should be avoided and we should focus on nonopioid pain control.

## 2019-11-06 ENCOUNTER — Other Ambulatory Visit: Payer: Self-pay | Admitting: Internal Medicine

## 2019-11-06 DIAGNOSIS — N3946 Mixed incontinence: Secondary | ICD-10-CM

## 2019-11-12 ENCOUNTER — Encounter: Payer: Self-pay | Admitting: Radiation Oncology

## 2019-12-05 ENCOUNTER — Ambulatory Visit: Payer: Medicaid Other

## 2019-12-18 ENCOUNTER — Other Ambulatory Visit: Payer: Self-pay | Admitting: Internal Medicine

## 2019-12-18 DIAGNOSIS — IMO0002 Reserved for concepts with insufficient information to code with codable children: Secondary | ICD-10-CM

## 2019-12-18 DIAGNOSIS — E1140 Type 2 diabetes mellitus with diabetic neuropathy, unspecified: Secondary | ICD-10-CM

## 2020-01-07 ENCOUNTER — Other Ambulatory Visit: Payer: Self-pay | Admitting: Internal Medicine

## 2020-01-07 DIAGNOSIS — G40209 Localization-related (focal) (partial) symptomatic epilepsy and epileptic syndromes with complex partial seizures, not intractable, without status epilepticus: Secondary | ICD-10-CM

## 2020-01-07 DIAGNOSIS — I10 Essential (primary) hypertension: Secondary | ICD-10-CM

## 2020-01-07 DIAGNOSIS — I5042 Chronic combined systolic (congestive) and diastolic (congestive) heart failure: Secondary | ICD-10-CM

## 2020-01-07 DIAGNOSIS — I5032 Chronic diastolic (congestive) heart failure: Secondary | ICD-10-CM

## 2020-01-07 DIAGNOSIS — IMO0002 Reserved for concepts with insufficient information to code with codable children: Secondary | ICD-10-CM

## 2020-01-07 DIAGNOSIS — E1140 Type 2 diabetes mellitus with diabetic neuropathy, unspecified: Secondary | ICD-10-CM

## 2020-01-11 ENCOUNTER — Other Ambulatory Visit: Payer: Self-pay | Admitting: Radiation Oncology

## 2020-01-25 ENCOUNTER — Other Ambulatory Visit: Payer: Self-pay | Admitting: Internal Medicine

## 2020-01-25 ENCOUNTER — Other Ambulatory Visit: Payer: Self-pay | Admitting: Radiation Oncology

## 2020-01-25 DIAGNOSIS — E1140 Type 2 diabetes mellitus with diabetic neuropathy, unspecified: Secondary | ICD-10-CM

## 2020-01-25 DIAGNOSIS — IMO0002 Reserved for concepts with insufficient information to code with codable children: Secondary | ICD-10-CM

## 2020-01-25 DIAGNOSIS — I5032 Chronic diastolic (congestive) heart failure: Secondary | ICD-10-CM

## 2020-01-25 DIAGNOSIS — I10 Essential (primary) hypertension: Secondary | ICD-10-CM

## 2020-01-25 DIAGNOSIS — N3946 Mixed incontinence: Secondary | ICD-10-CM

## 2020-01-25 DIAGNOSIS — G40209 Localization-related (focal) (partial) symptomatic epilepsy and epileptic syndromes with complex partial seizures, not intractable, without status epilepticus: Secondary | ICD-10-CM

## 2020-01-25 DIAGNOSIS — I5042 Chronic combined systolic (congestive) and diastolic (congestive) heart failure: Secondary | ICD-10-CM

## 2020-01-28 MED ORDER — SOLIFENACIN SUCCINATE 10 MG PO TABS: 10.0000 mg | ORAL_TABLET | Freq: Every day | ORAL | 2 refills | Status: DC

## 2020-01-28 NOTE — Telephone Encounter (Signed)
All refills aside from stool softener and vesicare declined - others were all refilled 2 weeks ago by PCP.

## 2020-01-28 NOTE — Telephone Encounter (Signed)
Patient needs follow up, please have her schedule PCP next available. Approved refill request for spironolactone and atorvastatin.

## 2020-01-28 NOTE — Telephone Encounter (Signed)
Please schedule pt an appt w/PCP. Thanks 

## 2020-02-05 ENCOUNTER — Ambulatory Visit: Payer: Medicaid Other | Admitting: Radiation Oncology

## 2020-02-05 ENCOUNTER — Other Ambulatory Visit: Payer: Self-pay

## 2020-02-05 DIAGNOSIS — Z20822 Contact with and (suspected) exposure to covid-19: Secondary | ICD-10-CM

## 2020-02-06 NOTE — Progress Notes (Signed)
Called patient multiple times. Unable to leave message.

## 2020-02-07 ENCOUNTER — Other Ambulatory Visit: Payer: Self-pay

## 2020-02-07 ENCOUNTER — Ambulatory Visit (INDEPENDENT_AMBULATORY_CARE_PROVIDER_SITE_OTHER): Payer: Medicaid Other | Admitting: Radiation Oncology

## 2020-02-07 DIAGNOSIS — Z20822 Contact with and (suspected) exposure to covid-19: Secondary | ICD-10-CM

## 2020-02-07 DIAGNOSIS — B372 Candidiasis of skin and nail: Secondary | ICD-10-CM

## 2020-02-07 HISTORY — DX: Contact with and (suspected) exposure to covid-19: Z20.822

## 2020-02-07 MED ORDER — FLUTICASONE PROPIONATE 0.05 % EX CREA: TOPICAL_CREAM | Freq: Two times a day (BID) | CUTANEOUS | 6 refills | Status: DC

## 2020-02-07 MED ORDER — DICLOFENAC SODIUM 1 % EX GEL: 2.0000 g | Freq: Four times a day (QID) | CUTANEOUS | 0 refills | Status: DC

## 2020-02-07 MED ORDER — NYSTATIN 100000 UNIT/GM EX POWD: 1.0000 "application " | Freq: Three times a day (TID) | CUTANEOUS | 6 refills | Status: DC

## 2020-02-07 NOTE — Progress Notes (Addendum)
  [facility] Internal Medicine Residency Telephone Encounter Continuity Care Appointment  HPI:   This telephone encounter was created for [name] on 02/07/2020 for the following purpose/cc covid exposure.   Past Medical History:  Past Medical History:  Diagnosis Date  . Allergic rhinitis   . Arthritis    "hips, back, legs, arms" (07/04/2014)  . Asthma    hx  . Automatic implantable cardioverter-defibrillator in situ   . Calcifying tendinitis of shoulder   . Chronic combined systolic and diastolic CHF (congestive heart failure) (HCC)    EF 40-45% by echo 12/06/2012  . Chronic diastolic heart failure (HCC)     Primarily diastolic CHF: Likely due to uncontrolled HTN. Last echo (8/12) with EF 45-50%, mild to moderate LVH with some asymmetric septal hypertrophy, RV normal size and systolic function. EF 50-55% by LV-gram in 6/12.   . Chronic lower back pain    secondary to DJD, obsetiy, hip problems. Followed by Dr. Ivory Broad (pain management)  . Coronary artery disease    questionable. LHC 05/2011 showing normal coronaries // Followed at [facility], Dr. Shirlee Latch  . Degeneration of lumbar or lumbosacral intervertebral disc   . DJD (degenerative joint disease) of hip    right sided  . Frequent UTI   . GERD (gastroesophageal reflux disease)   . HLD (hyperlipidemia)   . Hypertension    Poorly controlled. Has had HTN since age 25. Angioedema with ACEI.  24 Hr urine and renal arterial dopplers ordered . . . Never done  . LBBB (left bundle branch block)   . Liver disease   . Morbid obesity (HCC)   . NICM (nonischemic cardiomyopathy) (HCC)    EF 45-50% in 8/12, cath 6/12 showed normal coronaries, EF 50-55% by LV gram  . OSA on CPAP    sleep study in 8/12 showed moderate to severe OSA requiring CPAP  . Polyneuropathy in diabetes(357.2)   . Presence of permanent cardiac pacemaker   . Seizures (HCC)    last 3 months  . Shortness of breath    none now  . Stroke [facility])  12/2013   "my left side is paralyzed" (07/04/2014)  . Thoracic or lumbosacral neuritis or radiculitis, unspecified   . Type II diabetes mellitus (HCC) DX: 2002      ROS:   No fevers, chills, sore throat, cough, chest pain or shortness of breath.   Assessment / Plan / Recommendations:   Please see A&P under problem oriented charting for assessment of the patient's acute and chronic medical conditions.   As always, pt is advised that if symptoms worsen or new symptoms arise, they should go to an urgent care facility or to to ER for further evaluation.   Consent and Medical Decision Making:   Patient discussed with Dr. Antony Contras  This is a telephone encounter between [name] and Jenell Milliner on 02/07/2020 for covid exposure. The visit was conducted with the patient located at home and Jenell Milliner at [facility]. The patient's identity was confirmed using their DOB and current address. The patient has consented to being evaluated through a telephone encounter and understands the associated risks (an examination cannot be done and the patient may need to come in for an appointment) / benefits (allows the patient to remain at home, decreasing exposure to coronavirus). I personally spent 10 minutes on medical discussion.

## 2020-02-07 NOTE — Progress Notes (Signed)
Internal Medicine Clinic Attending  Case discussed with Dr. Lanierat the time of the visit.  We reviewed the resident's history and exam and pertinent patient test results.  I agree with the assessment, diagnosis, and plan of care documented in the resident's note.   

## 2020-02-08 NOTE — Assessment & Plan Note (Signed)
This is chronic and uncontrolled.  Spoke with patient over phone due to Covid exposure.  At the end of our call the patient reports she has run out of her nystatin powder and fluticasone cream for prevention of breakdown sacral wounds and intertrigo.  She requests refills.  Patient denies fevers chills or infectious symptoms.  Plan 1: -Refill fluticasone -Order nystatin powder

## 2020-02-08 NOTE — Assessment & Plan Note (Signed)
Patient reports that her roommate was diagnosed with Covid last week.  She received a Covid test the next day which was negative.  They have separate bedrooms on either end of the apartment.  They do not share a bathroom.  They share a kitchen but she has tried not to use it since he has been positive. When she does she uses the kitchen she uses Clorox on everything.  They wear a mask in the apartment.  Unfortunately, her daughter tested positive for Covid yesterday.  She was with her daughter the day before.  She states she was wearing a mask and stayed at least 6 feet away.  We discussed that if she had been less than 6 feet away or with her daughter for extended period of time she likely needs a test.  Patient reports she was always greater than 6 feet away and was not with her for extended period of time.  Patient denies fevers, chills, rhinorrhea, sore throat, cough, chest pain, shortness of breath.  We discussed that if any symptoms should arise she should obtain a test.  If she were to develop a serious symptoms like chest pain or shortness of breath she should obtain urgent evaluation.  Plan: -Continue current distancing behaviors at home -Obtain Covid test if symptoms arise

## 2020-02-11 ENCOUNTER — Other Ambulatory Visit: Payer: Self-pay | Admitting: Radiation Oncology

## 2020-02-12 NOTE — Telephone Encounter (Signed)
I refilled this last week.

## 2020-02-27 ENCOUNTER — Other Ambulatory Visit: Payer: Self-pay

## 2020-02-27 ENCOUNTER — Ambulatory Visit (INDEPENDENT_AMBULATORY_CARE_PROVIDER_SITE_OTHER): Payer: Medicaid Other | Admitting: Internal Medicine

## 2020-02-27 ENCOUNTER — Telehealth: Payer: Self-pay | Admitting: *Deleted

## 2020-02-27 ENCOUNTER — Other Ambulatory Visit: Payer: Self-pay | Admitting: Radiation Oncology

## 2020-02-27 DIAGNOSIS — R32 Unspecified urinary incontinence: Secondary | ICD-10-CM

## 2020-02-27 NOTE — Assessment & Plan Note (Signed)
Urinary incontinence: Since her CVA 7 years prior. She has complete loss of bladder control following her stroke.  This is a neurologic process secondary to a central neurologic deficit.  She is able to ambulate with a cane.  She notes that when she stands to proceed to the restroom to void she has urinary leakage of moderate volume.  Without incontinence supplies she must change her clothes at least 10 times daily.  Today she is requesting adult large pull-ups, disposable underpads, sterile gloves and moist flushable wipes to prevent continued ear deterioration of her condition.

## 2020-02-27 NOTE — Progress Notes (Signed)
  [facility] Internal Medicine Residency Telephone Encounter Continuity Care Appointment  HPI:   This telephone encounter was created for [name] on 02/27/2020 for the following purpose/cc urinary incontinence due to her CVA.   Past Medical History:  Past Medical History:  Diagnosis Date  . Allergic rhinitis   . Arthritis    "hips, back, legs, arms" (07/04/2014)  . Asthma    hx  . Automatic implantable cardioverter-defibrillator in situ   . Calcifying tendinitis of shoulder   . Chronic combined systolic and diastolic CHF (congestive heart failure) (HCC)    EF 40-45% by echo 12/06/2012  . Chronic diastolic heart failure (HCC)     Primarily diastolic CHF: Likely due to uncontrolled HTN. Last echo (8/12) with EF 45-50%, mild to moderate LVH with some asymmetric septal hypertrophy, RV normal size and systolic function. EF 50-55% by LV-gram in 6/12.   . Chronic lower back pain    secondary to DJD, obsetiy, hip problems. Followed by Dr. Ivory Broad (pain management)  . Coronary artery disease    questionable. LHC 05/2011 showing normal coronaries // Followed at [facility], Dr. Shirlee Latch  . Degeneration of lumbar or lumbosacral intervertebral disc   . DJD (degenerative joint disease) of hip    right sided  . Frequent UTI   . GERD (gastroesophageal reflux disease)   . HLD (hyperlipidemia)   . Hypertension    Poorly controlled. Has had HTN since age 38. Angioedema with ACEI.  24 Hr urine and renal arterial dopplers ordered . . . Never done  . LBBB (left bundle branch block)   . Liver disease   . Morbid obesity (HCC)   . NICM (nonischemic cardiomyopathy) (HCC)    EF 45-50% in 8/12, cath 6/12 showed normal coronaries, EF 50-55% by LV gram  . OSA on CPAP    sleep study in 8/12 showed moderate to severe OSA requiring CPAP  . Polyneuropathy in diabetes(357.2)   . Presence of permanent cardiac pacemaker   . Seizures (HCC)    last 3 months  . Shortness of breath    none  now  . Stroke [facility]) 12/2013   "my left side is paralyzed" (07/04/2014)  . Thoracic or lumbosacral neuritis or radiculitis, unspecified   . Type II diabetes mellitus (HCC) DX: 2002      ROS:   Negative except as per HPI.   Assessment / Plan / Recommendations:   Please see A&P under problem oriented charting for assessment of the patient's acute and chronic medical conditions.   As always, pt is advised that if symptoms worsen or new symptoms arise, they should go to an urgent care facility or to to ER for further evaluation.   Consent and Medical Decision Making:   Patient discussed with Dr. Heide Spark  This is a telephone encounter between [name] and Lanelle Bal on 02/27/2020 for urinary incontinence. The visit was conducted with the patient located at home and [employer] at [facility]. The patient's identity was confirmed using their DOB and current address. The patient has consented to being evaluated through a telephone encounter and understands the associated risks (an examination cannot be done and the patient may need to come in for an appointment) / benefits (allows the patient to remain at home, decreasing exposure to coronavirus). I personally spent 4 minutes on medical discussion.

## 2020-02-27 NOTE — Assessment & Plan Note (Signed)
>>  ASSESSMENT AND PLAN FOR URINARY INCONTINENCE WRITTEN ON 02/27/2020  9:15 AM BY HARBRECHT, LAWRENCE, MD  Urinary incontinence: Since her CVA 7 years prior. She has complete loss of bladder control following her stroke.  This is a neurologic process secondary to a central neurologic deficit.  She is able to ambulate with a cane.  She notes that when she stands to proceed to the restroom to void she has urinary leakage of moderate volume.  Without incontinence supplies she must change her clothes at least 10 times daily.  Today she is requesting adult large pull-ups, disposable underpads, sterile gloves and moist flushable wipes to prevent continued ear deterioration of her condition.

## 2020-02-27 NOTE — Telephone Encounter (Signed)
CMN, order form for urinary incontinence supplies, and today's OV note faxed to [facility] delivered at [phone]. Kinnie Feil, BSN, RN-BC

## 2020-02-27 NOTE — Progress Notes (Signed)
Internal Medicine Clinic Attending  Case discussed with Dr. Harbrecht at the time of the visit.  We reviewed the resident's history and exam and pertinent patient test results.  I agree with the assessment, diagnosis, and plan of care documented in the resident's note.   

## 2020-02-29 ENCOUNTER — Telehealth: Payer: Self-pay | Admitting: Radiation Oncology

## 2020-02-29 NOTE — Telephone Encounter (Signed)
Spoke with Ladona Ridgel at [facility]. States they received all the documents they needed for patient's incontinence supplies. Nothing further needed. Kinnie Feil, BSN, RN-BC

## 2020-02-29 NOTE — Telephone Encounter (Signed)
[name] needs physician order [id number]

## 2020-03-10 LAB — HM DIABETES EYE EXAM

## 2020-03-11 ENCOUNTER — Encounter: Payer: Self-pay | Admitting: *Deleted

## 2020-03-17 ENCOUNTER — Telehealth: Payer: Self-pay | Admitting: Radiation Oncology

## 2020-03-17 NOTE — Telephone Encounter (Signed)
Pls wants to discuss a wheelchair [phone]

## 2020-03-19 NOTE — Telephone Encounter (Signed)
Returned call to patient. She is interested in obtaining a power w/c. [facility] telehealth appt given tomorrow AM to discuss need for this. Community message sent to Zenia Resides at [employer] to see if patient will require PT Eval. L. Joclyn Alsobrook, BSN, RN-BC

## 2020-03-20 ENCOUNTER — Other Ambulatory Visit: Payer: Self-pay | Admitting: Radiation Oncology

## 2020-03-20 ENCOUNTER — Ambulatory Visit (INDEPENDENT_AMBULATORY_CARE_PROVIDER_SITE_OTHER): Payer: Medicaid Other | Admitting: Internal Medicine

## 2020-03-20 ENCOUNTER — Encounter: Payer: Self-pay | Admitting: Internal Medicine

## 2020-03-20 ENCOUNTER — Other Ambulatory Visit: Payer: Self-pay

## 2020-03-20 DIAGNOSIS — I69354 Hemiplegia and hemiparesis following cerebral infarction affecting left non-dominant side: Secondary | ICD-10-CM | POA: Diagnosis not present

## 2020-03-20 DIAGNOSIS — I5042 Chronic combined systolic (congestive) and diastolic (congestive) heart failure: Secondary | ICD-10-CM

## 2020-03-20 NOTE — Telephone Encounter (Signed)
[name], [name]  [name], [name], RN  Hi Lauren -   Thanks and I have pulled patient's demos and problem list. Please add DME order in epic for Power Wheelchair.   Please request MD include the following in the note:   -MD is seeing patient for a Power Wheelchair  -MD is referring patient for a PT Mobility/Seating evaluation for Power Wheelchair.  -Include ADL's in the home that patient needs a power wheelchair for - example moving from room to room, preparing food, etc.   I will pull the office note and will create an order for the eval and fax it to you to get signed and we will schedule patient for the PT Mobility/Seating evaluation (So no need for you to send referral for that).   Thank you and please let me know if you have any questions!

## 2020-03-20 NOTE — Assessment & Plan Note (Addendum)
This is a mobility assessment for patient to be able to get a power wheelchair. She has a history of CVA 7 years ago with residual left-sided hemiparesis. Her left upper extremity is completed paralyzed, 0/5 strength. Left lower extremity has been documented in previous visits at 3/5 strength.  Power chair will assist patient with ADL's, including transferring from room to room, making meals for herself, as well as making it to the bathroom easier. Patient currently has a difficult time making it to the bathroom before soiling herself.  She has used a cane up until now, but chronic pain in her right shoulder, leg and back that she rates 8/10 on average every day are inhibiting her function. She would not be able to use walker or manual wheelchair due to paralysis of left arm. She will require power wheelchair with controls in right side to be able to carry out her ADLs. Due to above mentioned, she would also not have the grip required to use a scooter.  Patient has full mental capacity to operate a power wheelchair and is willing to use this in her home.  Will refer patient for PT/mobility seating evaluation.

## 2020-03-20 NOTE — Progress Notes (Signed)
  [facility] Internal Medicine Residency Telephone Encounter Continuity Care Appointment  HPI:   This telephone encounter was created for [name] on 03/20/2020 for the following purpose/cc mobility assessment for power wheelchair.   Past Medical History:  Past Medical History:  Diagnosis Date  . Allergic rhinitis   . Arthritis    "hips, back, legs, arms" (07/04/2014)  . Asthma    hx  . Automatic implantable cardioverter-defibrillator in situ   . Calcifying tendinitis of shoulder   . Chronic combined systolic and diastolic CHF (congestive heart failure) (HCC)    EF 40-45% by echo 12/06/2012  . Chronic diastolic heart failure (HCC)     Primarily diastolic CHF: Likely due to uncontrolled HTN. Last echo (8/12) with EF 45-50%, mild to moderate LVH with some asymmetric septal hypertrophy, RV normal size and systolic function. EF 50-55% by LV-gram in 6/12.   . Chronic lower back pain    secondary to DJD, obsetiy, hip problems. Followed by Dr. Ivory Broad (pain management)  . Coronary artery disease    questionable. LHC 05/2011 showing normal coronaries // Followed at [facility], Dr. Shirlee Latch  . Degeneration of lumbar or lumbosacral intervertebral disc   . DJD (degenerative joint disease) of hip    right sided  . Frequent UTI   . GERD (gastroesophageal reflux disease)   . HLD (hyperlipidemia)   . Hypertension    Poorly controlled. Has had HTN since age 70. Angioedema with ACEI.  24 Hr urine and renal arterial dopplers ordered . . . Never done  . LBBB (left bundle branch block)   . Liver disease   . Morbid obesity (HCC)   . NICM (nonischemic cardiomyopathy) (HCC)    EF 45-50% in 8/12, cath 6/12 showed normal coronaries, EF 50-55% by LV gram  . OSA on CPAP    sleep study in 8/12 showed moderate to severe OSA requiring CPAP  . Polyneuropathy in diabetes(357.2)   . Presence of permanent cardiac pacemaker   . Seizures (HCC)    last 3 months  . Shortness of breath    none now  . Stroke [facility]) 12/2013   "my left side is paralyzed" (07/04/2014)  . Thoracic or lumbosacral neuritis or radiculitis, unspecified   . Type II diabetes mellitus (HCC) DX: 2002      ROS:   Endorses daily pain in back, right leg and shoulder. No new neurologic deficits or falls.    Assessment / Plan / Recommendations:   Please see A&P under problem oriented charting for assessment of the patient's acute and chronic medical conditions.   As always, pt is advised that if symptoms worsen or new symptoms arise, they should go to an urgent care facility or to to ER for further evaluation.   Consent and Medical Decision Making:   Patient discussed with Dr. Criselda Peaches  This is a telephone encounter between [name] and Bridget Hartshorn on 03/20/2020 for mobility assessment for power wheelchair. The visit was conducted with the patient located at home and Bridget Hartshorn at [facility]. The patient's identity was confirmed using their DOB and current address. The patient has consented to being evaluated through a telephone encounter and understands the associated risks (an examination cannot be done and the patient may need to come in for an appointment) / benefits (allows the patient to remain at home, decreasing exposure to coronavirus). I personally spent 15 minutes on medical discussion.

## 2020-03-21 LAB — HM DIABETES EYE EXAM

## 2020-03-21 NOTE — Addendum Note (Signed)
Addended by: Lenward Chancellor D on: 03/21/2020 11:56 AM   Modules accepted: Orders

## 2020-03-27 NOTE — Telephone Encounter (Signed)
Signed PT Mobility/Seating Eval faxed to Zenia Resides at [facility] at [phone] with fax confirmation receipt received. Kinnie Feil, BSN, RN-BC

## 2020-03-27 NOTE — Progress Notes (Signed)
Internal Medicine Clinic Attending  Case discussed with Dr. Darl Pikes at the time of the visit.  We reviewed the resident's history and pertinent patient test results.  I agree with the assessment, diagnosis, and plan of care documented in the resident's note.

## 2020-03-28 NOTE — Telephone Encounter (Signed)
Glynn Octave, [name], RN   Signature received. We will get patient scheduled for the PT Mobility/Seating eval and once that is performed I will collect forms for signature and fax to you.   Thanks!

## 2020-04-18 ENCOUNTER — Encounter: Payer: Self-pay | Admitting: *Deleted

## 2020-04-22 ENCOUNTER — Other Ambulatory Visit: Payer: Self-pay | Admitting: Internal Medicine

## 2020-04-22 DIAGNOSIS — N3946 Mixed incontinence: Secondary | ICD-10-CM

## 2020-06-10 ENCOUNTER — Other Ambulatory Visit: Payer: Self-pay | Admitting: Internal Medicine

## 2020-06-10 DIAGNOSIS — I4891 Unspecified atrial fibrillation: Secondary | ICD-10-CM

## 2020-06-10 NOTE — Telephone Encounter (Signed)
Received faxed PT Mobility/Seating Eval and CMN for power w/c. Placed in Provider's box for signatures. Will then fax to Zenia Resides at [phone]. Kinnie Feil, BSN, RN-BC

## 2020-06-18 NOTE — Telephone Encounter (Signed)
Signed PT Mobility/Seating Eval, and CMN faxed to Zenia Resides at [facility] at [phone]. Fax confirmation receipt received. Kinnie Feil, BSN, RN-BC

## 2020-06-19 NOTE — Telephone Encounter (Signed)
[name], [name]  Jeno Calleros, [name], RN  Got them. Thank you!

## 2020-07-15 ENCOUNTER — Ambulatory Visit: Payer: Medicaid Other | Admitting: Student

## 2020-07-15 ENCOUNTER — Other Ambulatory Visit: Payer: Self-pay

## 2020-07-15 ENCOUNTER — Encounter: Payer: Self-pay | Admitting: Student

## 2020-07-15 VITALS — BP 151/94 | HR 88 | Temp 98.7°F | Ht 69.0 in | Wt 237.0 lb

## 2020-07-15 DIAGNOSIS — I10 Essential (primary) hypertension: Secondary | ICD-10-CM

## 2020-07-15 DIAGNOSIS — Z1211 Encounter for screening for malignant neoplasm of colon: Secondary | ICD-10-CM | POA: Diagnosis not present

## 2020-07-15 DIAGNOSIS — Z1159 Encounter for screening for other viral diseases: Secondary | ICD-10-CM | POA: Diagnosis not present

## 2020-07-15 DIAGNOSIS — E1165 Type 2 diabetes mellitus with hyperglycemia: Secondary | ICD-10-CM

## 2020-07-15 DIAGNOSIS — I5042 Chronic combined systolic (congestive) and diastolic (congestive) heart failure: Secondary | ICD-10-CM

## 2020-07-15 DIAGNOSIS — N3946 Mixed incontinence: Secondary | ICD-10-CM | POA: Diagnosis not present

## 2020-07-15 DIAGNOSIS — Z1231 Encounter for screening mammogram for malignant neoplasm of breast: Secondary | ICD-10-CM | POA: Diagnosis not present

## 2020-07-15 DIAGNOSIS — I69354 Hemiplegia and hemiparesis following cerebral infarction affecting left non-dominant side: Secondary | ICD-10-CM | POA: Diagnosis not present

## 2020-07-15 DIAGNOSIS — I4891 Unspecified atrial fibrillation: Secondary | ICD-10-CM | POA: Diagnosis not present

## 2020-07-15 DIAGNOSIS — IMO0002 Reserved for concepts with insufficient information to code with codable children: Secondary | ICD-10-CM

## 2020-07-15 DIAGNOSIS — E1140 Type 2 diabetes mellitus with diabetic neuropathy, unspecified: Secondary | ICD-10-CM | POA: Diagnosis present

## 2020-07-15 DIAGNOSIS — I69359 Hemiplegia and hemiparesis following cerebral infarction affecting unspecified side: Secondary | ICD-10-CM

## 2020-07-15 LAB — POCT URINALYSIS DIPSTICK
Bilirubin, UA: NEGATIVE
Glucose, UA: POSITIVE — AB
Ketones, UA: NEGATIVE
Nitrite, UA: NEGATIVE
Protein, UA: POSITIVE — AB
Spec Grav, UA: 1.025 (ref 1.010–1.025)
Urobilinogen, UA: 0.2 E.U./dL
pH, UA: 5 (ref 5.0–8.0)

## 2020-07-15 LAB — GLUCOSE, CAPILLARY: Glucose-Capillary: 334 mg/dL — ABNORMAL HIGH (ref 70–99)

## 2020-07-15 LAB — POCT GLYCOSYLATED HEMOGLOBIN (HGB A1C): Hemoglobin A1C: 13.1 % — AB (ref 4.0–5.6)

## 2020-07-15 MED ORDER — NYSTATIN 100000 UNIT/GM EX POWD: 1.0000 "application " | Freq: Three times a day (TID) | CUTANEOUS | 6 refills | Status: DC

## 2020-07-15 MED ORDER — NYSTATIN 100000 UNIT/GM EX CREA: 1.0000 "application " | TOPICAL_CREAM | Freq: Two times a day (BID) | CUTANEOUS | 0 refills | Status: DC

## 2020-07-15 MED ORDER — SOLIFENACIN SUCCINATE 10 MG PO TABS: 10.0000 mg | ORAL_TABLET | Freq: Every day | ORAL | 5 refills | Status: DC

## 2020-07-15 MED ORDER — EMPAGLIFLOZIN 10 MG PO TABS: 10.0000 mg | ORAL_TABLET | Freq: Every day | ORAL | 0 refills | Status: DC

## 2020-07-15 MED ORDER — RIVAROXABAN 20 MG PO TABS: ORAL_TABLET | ORAL | 2 refills | Status: DC

## 2020-07-15 NOTE — Assessment & Plan Note (Addendum)
Patient with history of DM. HbA1c today of 13.1. Reports having poor sugar control at home with readings in 300s-400s at times. On lantus 52 units and Metformin 1000mg  BID. Started on Jardiance 10mg  today. Advised on lifestyle modifications including healthier diet and exercise. Also requested new diabetic shoes and inserts for feet, which I have ordered.  -Repeat HbA1c in 3 months

## 2020-07-15 NOTE — Assessment & Plan Note (Signed)
Today's Vitals   07/15/20 1039 07/15/20 1044  BP: (!) 151/94   Pulse: 88   Temp: 98.7 F (37.1 C)   TempSrc: Oral   SpO2: 100%   Weight: 237 lb (107.5 kg)   Height: 5\' 9"  (1.753 m)   PainSc:  8    Body mass index is 35 kg/m.  Patient with known HTN on amlodipine 10, lasix 40, metoprolol 100 BID, and spironolactone 50mg . Still has high BP on visit today. Never started taking Jardiance because it was sent to another pharmacy. Sent prescription for Jardiance to desired pharmacy. Will monitor BP and DM with start of Jardiance.  -follow up in 3 months to repeat HbA1c

## 2020-07-15 NOTE — Patient Instructions (Signed)
[name], it was a pleasure seeing you today.  We discussed your high blood sugars today. I will be sending you a prescription for Jardiance 10mg  daily. Please take this along with your other diabetic medications. We would like to see you back in 3 months to recheck your 11-month blood sugar (HbA1c).   We did some screening tests for you and we have referred you for a mammogram at the breast center here and to a GI doctor for a colonoscopy.  We placed an order for your diabetic shoes and inserts.  I also refilled your medications for you, except for the digoxin for which you need to see your cardiologist first.  Lastly, we placed an order for you to see the physical therapists here at [facility].  Please try to incorporate a healthier diet and some exercise to keep your blood sugar and high blood pressure better managed.  Please come back in 3 months to recheck your hemoglobin A1c and for your pap smear.

## 2020-07-15 NOTE — Assessment & Plan Note (Signed)
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 Requested therapy to help with strength training.  -Referred for physical therapy at [facility] rehab

## 2020-07-15 NOTE — Progress Notes (Signed)
CC: follow up visit  HPI:  [name] is a 54 y.o. female who presented to the [facility] for follow up of her chronic medical issues. Please see assessment and plan for full HPI.  Past Medical History:  Diagnosis Date  . Allergic rhinitis   . Arthritis    "hips, back, legs, arms" (07/04/2014)  . Asthma    hx  . Automatic implantable cardioverter-defibrillator in situ   . Calcifying tendinitis of shoulder   . Chronic combined systolic and diastolic CHF (congestive heart failure) ([place])    EF 40-45% by echo 12/06/2012  . Chronic diastolic heart failure (HCC)     Primarily diastolic CHF: Likely due to uncontrolled HTN. Last echo (8/12) with EF 45-50%, mild to moderate LVH with some asymmetric septal hypertrophy, RV normal size and systolic function. EF 50-55% by LV-gram in 6/12.   . Chronic lower back pain    secondary to DJD, obsetiy, hip problems. Followed by Dr. Oval Linsey (pain management)  . Coronary artery disease    questionable. LHC 05/2011 showing normal coronaries // Followed at [facility] Cardiology, Dr. Aundra Dubin  . Degeneration of lumbar or lumbosacral intervertebral disc   . DJD (degenerative joint disease) of hip    right sided  . Frequent UTI   . GERD (gastroesophageal reflux disease)   . HLD (hyperlipidemia)   . Hypertension    Poorly controlled. Has had HTN since age 43. Angioedema with ACEI.  24 Hr urine and renal arterial dopplers ordered . . . Never done  . LBBB (left bundle branch block)   . Liver disease   . Morbid obesity ([place])   . NICM (nonischemic cardiomyopathy) ([place])    EF 45-50% in 8/12, cath 6/12 showed normal coronaries, EF 50-55% by LV gram  . OSA on CPAP    sleep study in 8/12 showed moderate to severe OSA requiring CPAP  . Polyneuropathy in diabetes(357.2)   . Presence of permanent cardiac pacemaker   . Seizures ([place])    last 3 months  . Shortness of breath    none now  . Stroke [facility]) 12/2013   "my left side is paralyzed" (07/04/2014)  .  Thoracic or lumbosacral neuritis or radiculitis, unspecified   . Type II diabetes mellitus (HCC) DX: 2002   Current Outpatient Medications on File Prior to Visit  Medication Sig Dispense Refill  . Accu-Chek FastClix Lancets MISC Check blood sugar 2 times a day 102 each 12  . amLODipine (NORVASC) 10 MG tablet TAKE ONE TABLET BY MOUTH EVERY DAY 90 tablet 0  . atorvastatin (LIPITOR) 40 MG tablet TAKE ONE TABLET BY MOUTH EVERY DAY 90 tablet 0  . Blood Glucose Monitoring Suppl (ACCU-CHEK GUIDE) w/Device KIT 1 each by Does not apply route 3 (three) times daily. 1 kit 1  . calamine lotion Apply 1 application topically as needed for itching. 120 mL 0  . cyclobenzaprine (FLEXERIL) 5 MG tablet Take 1 tablet (5 mg total) by mouth 3 (three) times daily as needed for muscle spasms. 15 tablet 0  . diclofenac Sodium (VOLTAREN) 1 % GEL APPLY 2 GRAMS TOPICALLY FOUR TIMES A DAY 300 g 4  . digoxin (LANOXIN) 0.125 MG tablet TAKE ONE TABLET BY MOUTH EVERY DAY 90 tablet 0  . fluticasone (CUTIVATE) 0.05 % cream Apply topically 2 (two) times daily. Dispense two tubes 30 g 6  . folic acid (FOLVITE) 1 MG tablet TAKE ONE TABLET BY MOUTH EVERY DAY 90 tablet 1  . furosemide (LASIX) 40  MG tablet TAKE 1 TABLET BY MOUTH TWICE A WEEK 10 tablet 3  . gabapentin (NEURONTIN) 300 MG capsule Take 1 capsule (300 mg total) by mouth 3 (three) times daily. 90 capsule 5  . glucose blood (ACCU-CHEK GUIDE) test strip Check blood sugar 2 times per day 100 each 10  . Insulin Glargine (LANTUS SOLOSTAR) 100 UNIT/ML Solostar Pen Inject 52 Units into the skin daily at 10 pm. 30 mL 6  . isosorbide mononitrate (IMDUR) 60 MG 24 hr tablet TAKE 1 AND 1/2 TABLETS BY MOUTH DAILY 135 tablet 1  . levETIRAcetam (KEPPRA) 500 MG tablet TAKE 1 TABLET BY MOUTH TWO TIMES DAILY **MUST NOT MISS ANY DOSE** 180 tablet 0  . metFORMIN (GLUCOPHAGE) 1000 MG tablet TAKE ONE TABLET BY MOUTH TWO TIMES A DAY WITH A MEAL 180 tablet 0  . metoprolol succinate (TOPROL-XL) 100  MG 24 hr tablet Take 1 tablet (100 mg total) by mouth 2 (two) times daily. Take with or immediately following a meal. 180 tablet 1  . oxyCODONE-acetaminophen (PERCOCET) 10-325 MG tablet Take 1 tablet by mouth every 8 (eight) hours as needed for pain. 90 tablet 0  . pantoprazole (PROTONIX) 40 MG tablet Take 1 tablet (40 mg total) by mouth daily. 90 tablet 1  . spironolactone (ALDACTONE) 50 MG tablet TAKE ONE TABLET BY MOUTH EVERY DAY 90 tablet 0  . STOOL SOFTENER/LAXATIVE 50-8.6 MG tablet TAKE ONE TABLET BY MOUTH AT BEDTIME AS NEEDED FOR MILD CONSTIPATION 30 tablet 0  . UNIFINE PENTIPS 31G X 5 MM MISC USE TO INJECT INSULIN TWO TIMES DAILY 100 each 0  . Vitamin D, Ergocalciferol, (DRISDOL) 50000 units CAPS capsule TAKE ONE CAPSULE BY MOUTH EVERY MONDAY 4 capsule 0   No current facility-administered medications on file prior to visit.   Allergies  Allergen Reactions  . Ace Inhibitors Anaphylaxis    Swelling of the tongue and throat   . Clindamycin Anaphylaxis and Swelling  . Enalapril Maleate Anaphylaxis    Swelling of the tongue and throat   . Lisinopril Anaphylaxis    Swelling of the tongue and throat     Family History  Problem Relation Age of Onset  . Heart disease Mother 2       Died of MI at age 32 yo  . Kidney disease Mother        requiring dialysis  . Congestive Heart Failure Mother   . Heart disease Father 51       MI age 82yo requiring stenting  . Diabetes Father   . Glaucoma Father   . Heart disease Paternal Grandmother        requiring pacemaker.  [name] Heart disease Paternal Grandfather 23       Died of MI at possibly age 86-53yo  . Stroke Paternal Grandfather   . Diabetes Brother   . Heart disease Brother 25       MI at age 36 years old  . Breast cancer Paternal Aunt   . Breast cancer Maternal Grandmother    Social History   Socioeconomic History  . Marital status: Single    Spouse name: Not on file  . Number of children: 2  . Years of education: 9th grade    . Highest education level: Not on file  Occupational History  . Occupation: Unemployed    Comment: planning on getting disability  Tobacco Use  . Smoking status: Never Smoker  . Smokeless tobacco: Never Used  Vaping Use  . Vaping Use: Never  used  Substance and Sexual Activity  . Alcohol use: No    Alcohol/week: 0.0 standard drinks  . Drug use: No  . Sexual activity: Not on file  Other Topics Concern  . Not on file  Social History Narrative   Lives in [place] with her son. Is able to read and write fluently in [place].   Social Determinants of Health   Financial Resource Strain:   . Difficulty of Paying Living Expenses:   Food Insecurity:   . Worried About Charity fundraiser in the Last Year:   . Arboriculturist in the Last Year:   Transportation Needs:   . Film/video editor (Medical):   [name] Lack of Transportation (Non-Medical):   Physical Activity:   . Days of Exercise per Week:   . Minutes of Exercise per Session:   Stress:   . Feeling of Stress :   Social Connections:   . Frequency of Communication with Friends and Family:   . Frequency of Social Gatherings with Friends and Family:   . Attends Religious Services:   . Active Member of Clubs or Organizations:   . Attends Archivist Meetings:   [name] Marital Status:   Intimate Partner Violence:   . Fear of Current or Ex-Partner:   . Emotionally Abused:   [name] Physically Abused:   . Sexually Abused:     Review of Systems:   Please see assessment and plan for full ROS.  Physical Exam:  Vitals:   07/15/20 1039  BP: (!) 151/94  Pulse: 88  Temp: 98.7 F (37.1 C)  TempSrc: Oral  SpO2: 100%  Weight: 237 lb (107.5 kg)  Height: '5\' 9"'$  (1.753 m)   Physical Exam Constitutional:      Appearance: Normal appearance. She is obese.  HENT:     Head: Normocephalic and atraumatic.  Eyes:     Extraocular Movements: Extraocular movements intact.     Conjunctiva/sclera: Conjunctivae normal.     Pupils:  Pupils are equal, round, and reactive to light.  Cardiovascular:     Rate and Rhythm: Normal rate.     Comments: Paced rhythm Pulmonary:     Effort: Pulmonary effort is normal.     Breath sounds: Normal breath sounds. No wheezing, rhonchi or rales.  Abdominal:     General: Bowel sounds are normal. There is no distension.     Palpations: Abdomen is soft.     Tenderness: There is no abdominal tenderness. There is no guarding or rebound.  Musculoskeletal:     Comments: 1+ swelling in LLE  Skin:    General: Skin is warm and dry.  Neurological:     Mental Status: She is alert. Mental status is at baseline.     Comments: Sensation intact bilaterally. Hemiparesis on left side.  Psychiatric:        Mood and Affect: Mood normal.        Behavior: Behavior normal.        Thought Content: Thought content normal.        Judgment: Judgment normal.       Assessment & Plan:   See Encounters Tab for problem based charting.  Patient seen with Dr. Daryll Drown

## 2020-07-15 NOTE — Assessment & Plan Note (Signed)
Patient with history of combined systolic and diastolic HF. Following cardiologist, Dr. Maggie Font?, and has an appointment tomorrow. Still taking amlodipine 10mg , lasix 40mg , metoprolol 100mg  BID, spironolactone 50mg . Asked for refill of digoxin, but appears that she has not taken it for multiple months now so advised to see cardiologist before restarting.

## 2020-07-15 NOTE — Assessment & Plan Note (Signed)
Patient on xarelto and metoprolol for afib  -refilled xarelto

## 2020-07-16 ENCOUNTER — Encounter (HOSPITAL_COMMUNITY): Payer: Medicaid Other | Admitting: Cardiology

## 2020-07-16 LAB — MICROALBUMIN / CREATININE URINE RATIO
Creatinine, Urine: 56 mg/dL
Microalb/Creat Ratio: 272 mg/g creat — ABNORMAL HIGH (ref 0–29)
Microalbumin, Urine: 152.3 ug/mL

## 2020-07-16 LAB — HEPATITIS C ANTIBODY: Hep C Virus Ab: <0.1 s/co ratio (ref 0.0–0.9)

## 2020-07-16 NOTE — Progress Notes (Signed)
Internal Medicine Clinic Attending  I saw and evaluated the patient.  I personally confirmed the key portions of the history and exam documented by Dr. Jinwala and I reviewed pertinent patient test results.  The assessment, diagnosis, and plan were formulated together and I agree with the documentation in the resident's note.  

## 2020-07-18 ENCOUNTER — Encounter: Payer: Medicaid Other | Admitting: Student

## 2020-07-18 ENCOUNTER — Encounter: Payer: Medicaid Other | Admitting: Internal Medicine

## 2020-07-18 NOTE — Progress Notes (Deleted)
CC: ***  HPI:  [name] is a 54 y.o. female with the past medical history listed below who presented to the [facility] for ***. Please see assessment and plan for full HPI.  Past Medical History:  Diagnosis Date  . Allergic rhinitis   . Arthritis    "hips, back, legs, arms" (07/04/2014)  . Asthma    hx  . Automatic implantable cardioverter-defibrillator in situ   . Calcifying tendinitis of shoulder   . Chronic combined systolic and diastolic CHF (congestive heart failure) ([place])    EF 40-45% by echo 12/06/2012  . Chronic diastolic heart failure (HCC)     Primarily diastolic CHF: Likely due to uncontrolled HTN. Last echo (8/12) with EF 45-50%, mild to moderate LVH with some asymmetric septal hypertrophy, RV normal size and systolic function. EF 50-55% by LV-gram in 6/12.   . Chronic lower back pain    secondary to DJD, obsetiy, hip problems. Followed by Dr. Oval Linsey (pain management)  . Coronary artery disease    questionable. LHC 05/2011 showing normal coronaries // Followed at [facility], Dr. Aundra Dubin  . Degeneration of lumbar or lumbosacral intervertebral disc   . DJD (degenerative joint disease) of hip    right sided  . Frequent UTI   . GERD (gastroesophageal reflux disease)   . HLD (hyperlipidemia)   . Hypertension    Poorly controlled. Has had HTN since age 56. Angioedema with ACEI.  24 Hr urine and renal arterial dopplers ordered . . . Never done  . LBBB (left bundle branch block)   . Liver disease   . Morbid obesity ([place])   . NICM (nonischemic cardiomyopathy) ([place])    EF 45-50% in 8/12, cath 6/12 showed normal coronaries, EF 50-55% by LV gram  . OSA on CPAP    sleep study in 8/12 showed moderate to severe OSA requiring CPAP  . Polyneuropathy in diabetes(357.2)   . Presence of permanent cardiac pacemaker   . Seizures ([place])    last 3 months  . Shortness of breath    none now  . Stroke [facility]) 12/2013   "my left side is paralyzed" (07/04/2014)  . Thoracic  or lumbosacral neuritis or radiculitis, unspecified   . Type II diabetes mellitus (HCC) DX: 2002   Current Outpatient Medications on File Prior to Visit  Medication Sig Dispense Refill  . Accu-Chek FastClix Lancets MISC Check blood sugar 2 times a day 102 each 12  . amLODipine (NORVASC) 10 MG tablet TAKE ONE TABLET BY MOUTH EVERY DAY 90 tablet 0  . atorvastatin (LIPITOR) 40 MG tablet TAKE ONE TABLET BY MOUTH EVERY DAY 90 tablet 0  . Blood Glucose Monitoring Suppl (ACCU-CHEK GUIDE) w/Device KIT 1 each by Does not apply route 3 (three) times daily. 1 kit 1  . calamine lotion Apply 1 application topically as needed for itching. 120 mL 0  . cyclobenzaprine (FLEXERIL) 5 MG tablet Take 1 tablet (5 mg total) by mouth 3 (three) times daily as needed for muscle spasms. 15 tablet 0  . diclofenac Sodium (VOLTAREN) 1 % GEL APPLY 2 GRAMS TOPICALLY FOUR TIMES A DAY 300 g 4  . digoxin (LANOXIN) 0.125 MG tablet TAKE ONE TABLET BY MOUTH EVERY DAY 90 tablet 0  . empagliflozin (JARDIANCE) 10 MG TABS tablet Take 1 tablet (10 mg total) by mouth daily before breakfast. 30 tablet 0  . fluticasone (CUTIVATE) 0.05 % cream Apply topically 2 (two) times daily. Dispense two tubes 30 g 6  .  folic acid (FOLVITE) 1 MG tablet TAKE ONE TABLET BY MOUTH EVERY DAY 90 tablet 1  . furosemide (LASIX) 40 MG tablet TAKE 1 TABLET BY MOUTH TWICE A WEEK 10 tablet 3  . gabapentin (NEURONTIN) 300 MG capsule Take 1 capsule (300 mg total) by mouth 3 (three) times daily. 90 capsule 5  . glucose blood (ACCU-CHEK GUIDE) test strip Check blood sugar 2 times per day 100 each 10  . Insulin Glargine (LANTUS SOLOSTAR) 100 UNIT/ML Solostar Pen Inject 52 Units into the skin daily at 10 pm. 30 mL 6  . isosorbide mononitrate (IMDUR) 60 MG 24 hr tablet TAKE 1 AND 1/2 TABLETS BY MOUTH DAILY 135 tablet 1  . levETIRAcetam (KEPPRA) 500 MG tablet TAKE 1 TABLET BY MOUTH TWO TIMES DAILY **MUST NOT MISS ANY DOSE** 180 tablet 0  . metFORMIN (GLUCOPHAGE) 1000 MG  tablet TAKE ONE TABLET BY MOUTH TWO TIMES A DAY WITH A MEAL 180 tablet 0  . metoprolol succinate (TOPROL-XL) 100 MG 24 hr tablet Take 1 tablet (100 mg total) by mouth 2 (two) times daily. Take with or immediately following a meal. 180 tablet 1  . nystatin (NYSTATIN) powder Apply 1 application topically 3 (three) times daily. 15 g 6  . nystatin cream (MYCOSTATIN) Apply 1 application topically 2 (two) times daily. 30 g 0  . oxyCODONE-acetaminophen (PERCOCET) 10-325 MG tablet Take 1 tablet by mouth every 8 (eight) hours as needed for pain. 90 tablet 0  . pantoprazole (PROTONIX) 40 MG tablet Take 1 tablet (40 mg total) by mouth daily. 90 tablet 1  . rivaroxaban (XARELTO) 20 MG TABS tablet TAKE ONE TABLET BY MOUTH EVERY DAY WITH SUPPER 30 tablet 2  . solifenacin (VESICARE) 10 MG tablet Take 1 tablet (10 mg total) by mouth daily. 30 tablet 5  . spironolactone (ALDACTONE) 50 MG tablet TAKE ONE TABLET BY MOUTH EVERY DAY 90 tablet 0  . STOOL SOFTENER/LAXATIVE 50-8.6 MG tablet TAKE ONE TABLET BY MOUTH AT BEDTIME AS NEEDED FOR MILD CONSTIPATION 30 tablet 0  . UNIFINE PENTIPS 31G X 5 MM MISC USE TO INJECT INSULIN TWO TIMES DAILY 100 each 0  . Vitamin D, Ergocalciferol, (DRISDOL) 50000 units CAPS capsule TAKE ONE CAPSULE BY MOUTH EVERY MONDAY 4 capsule 0   No current facility-administered medications on file prior to visit.   Allergies  Allergen Reactions  . Ace Inhibitors Anaphylaxis    Swelling of the tongue and throat   . Clindamycin Anaphylaxis and Swelling  . Enalapril Maleate Anaphylaxis    Swelling of the tongue and throat   . Lisinopril Anaphylaxis    Swelling of the tongue and throat    Family History  Problem Relation Age of Onset  . Heart disease Mother 48       Died of MI at age 45 yo  . Kidney disease Mother        requiring dialysis  . Congestive Heart Failure Mother   . Heart disease Father 48       MI age 36yo requiring stenting  . Diabetes Father   . Glaucoma Father   . Heart  disease Paternal Grandmother        requiring pacemaker.  [name] Heart disease Paternal Grandfather 25       Died of MI at possibly age 64-53yo  . Stroke Paternal Grandfather   . Diabetes Brother   . Heart disease Brother 65       MI at age 19 years old  . Breast cancer  Paternal Aunt   . Breast cancer Maternal Grandmother    Social History   Socioeconomic History  . Marital status: Single    Spouse name: Not on file  . Number of children: 2  . Years of education: 9th grade  . Highest education level: Not on file  Occupational History  . Occupation: Unemployed    Comment: planning on getting disability  Tobacco Use  . Smoking status: Never Smoker  . Smokeless tobacco: Never Used  Vaping Use  . Vaping Use: Never used  Substance and Sexual Activity  . Alcohol use: No    Alcohol/week: 0.0 standard drinks  . Drug use: No  . Sexual activity: Not on file  Other Topics Concern  . Not on file  Social History Narrative   Lives in [place] with her son. Is able to read and write fluently in [place].   Social Determinants of Health   Financial Resource Strain:   . Difficulty of Paying Living Expenses:   Food Insecurity:   . Worried About Charity fundraiser in the Last Year:   . Arboriculturist in the Last Year:   Transportation Needs:   . Film/video editor (Medical):   [name] Lack of Transportation (Non-Medical):   Physical Activity:   . Days of Exercise per Week:   . Minutes of Exercise per Session:   Stress:   . Feeling of Stress :   Social Connections:   . Frequency of Communication with Friends and Family:   . Frequency of Social Gatherings with Friends and Family:   . Attends Religious Services:   . Active Member of Clubs or Organizations:   . Attends Archivist Meetings:   [name] Marital Status:   Intimate Partner Violence:   . Fear of Current or Ex-Partner:   . Emotionally Abused:   [name] Physically Abused:   . Sexually Abused:     Review of Systems:     Please see assessment and plan for full ROS.  Physical Exam:  There were no vitals filed for this visit. ***  Assessment & Plan:   See Encounters Tab for problem based charting.  Patient {GC/GE:[id number]::"discussed with","seen with"} Dr. {NAMES:[id number]::"Butcher","Guilloud","Hoffman","Mullen","Narendra","Raines","Vincent"}

## 2020-07-18 NOTE — Assessment & Plan Note (Deleted)
Referred for screening mammo at breast center and referred to GI for screening colonoscopy

## 2020-07-22 ENCOUNTER — Other Ambulatory Visit: Payer: Self-pay | Admitting: *Deleted

## 2020-07-22 MED ORDER — FOLIC ACID 1 MG PO TABS: 1.0000 mg | ORAL_TABLET | Freq: Every day | ORAL | 1 refills | Status: DC

## 2020-07-31 ENCOUNTER — Telehealth: Payer: Self-pay | Admitting: Student

## 2020-07-31 NOTE — Telephone Encounter (Signed)
Spoke with Lowella Bandy at [facility]. Patient was unable to receive Jardiance as PCP is not yet registered with [facility]. Gave name of Attending who also saw patient on 07/15/2020. Kinnie Feil, BSN, RN-BC

## 2020-07-31 NOTE — Telephone Encounter (Signed)
Pls contact pharmacy 

## 2020-08-05 ENCOUNTER — Encounter: Payer: Self-pay | Admitting: *Deleted

## 2020-08-13 ENCOUNTER — Telehealth: Payer: Self-pay | Admitting: Student

## 2020-08-13 ENCOUNTER — Telehealth: Payer: Self-pay | Admitting: *Deleted

## 2020-08-13 NOTE — Telephone Encounter (Signed)
Called patient left voice message for patient to give [facility] a call to schedule her appointment / received fax form office that patient's mail box was full.  Office also sent letter to patient.

## 2020-08-13 NOTE — Telephone Encounter (Signed)
Provided the patient with [facility] number to call their office and schedule her appointment.    [facility]  Address: [address], [place], [place] [address] Open ? Closes 5PM Phone: [phone]

## 2020-08-14 ENCOUNTER — Other Ambulatory Visit: Payer: Self-pay | Admitting: *Deleted

## 2020-08-15 MED ORDER — DICLOFENAC SODIUM 1 % EX GEL: 2.0000 g | Freq: Four times a day (QID) | CUTANEOUS | 1 refills | Status: DC

## 2020-08-16 ENCOUNTER — Other Ambulatory Visit: Payer: Self-pay | Admitting: Internal Medicine

## 2020-08-16 DIAGNOSIS — IMO0002 Reserved for concepts with insufficient information to code with codable children: Secondary | ICD-10-CM

## 2020-08-16 DIAGNOSIS — E1140 Type 2 diabetes mellitus with diabetic neuropathy, unspecified: Secondary | ICD-10-CM

## 2020-08-16 DIAGNOSIS — I5042 Chronic combined systolic (congestive) and diastolic (congestive) heart failure: Secondary | ICD-10-CM

## 2020-08-27 ENCOUNTER — Ambulatory Visit (HOSPITAL_COMMUNITY)
Admission: EM | Admit: 2020-08-27 | Discharge: 2020-08-27 | Disposition: A | Discharge status: Home / Self Care | Payer: Medicaid Other | Attending: Family Medicine | Admitting: Family Medicine

## 2020-08-27 ENCOUNTER — Encounter: Payer: Self-pay | Admitting: Student

## 2020-08-27 ENCOUNTER — Other Ambulatory Visit: Payer: Self-pay

## 2020-08-27 ENCOUNTER — Encounter (HOSPITAL_COMMUNITY): Payer: Self-pay | Admitting: Emergency Medicine

## 2020-08-27 DIAGNOSIS — R079 Chest pain, unspecified: Secondary | ICD-10-CM

## 2020-08-27 DIAGNOSIS — R12 Heartburn: Secondary | ICD-10-CM | POA: Diagnosis not present

## 2020-08-27 DIAGNOSIS — R5383 Other fatigue: Secondary | ICD-10-CM

## 2020-08-27 DIAGNOSIS — R32 Unspecified urinary incontinence: Secondary | ICD-10-CM

## 2020-08-27 DIAGNOSIS — R739 Hyperglycemia, unspecified: Secondary | ICD-10-CM

## 2020-08-27 LAB — POCT URINALYSIS DIPSTICK, ED / UC
Bilirubin Urine: NEGATIVE
Glucose, UA: >=1000 mg/dL — AB
Ketones, ur: NEGATIVE mg/dL
Nitrite: NEGATIVE
Protein, ur: NEGATIVE mg/dL
Specific Gravity, Urine: <=1.005 (ref 1.005–1.030)
Urobilinogen, UA: 0.2 mg/dL (ref 0.0–1.0)
pH: 5 (ref 5.0–8.0)

## 2020-08-27 LAB — CBG MONITORING, ED: Glucose-Capillary: >600 mg/dL (ref 70–99)

## 2020-08-27 NOTE — Discharge Instructions (Addendum)
Go to the ER now.

## 2020-08-27 NOTE — ED Triage Notes (Addendum)
Pt c/o heartburn x 2 weeks. She also states she has consistently wet the bed x 2 months. She states she has not had a BM in about a week, she thinks she is dehydrated. Pt states she drank some pedialyte yesterday and felt better. She also states she has had loss of appetite, particularly meats. Pt also states she has not taken any medication for the past 4 days and has been out of her pantoprazole about 3 weeks.

## 2020-08-27 NOTE — ED Provider Notes (Signed)
**Note Caitlyn-Identified via Obfuscation** [place]    CSN: [id number] Arrival date & time: 08/27/20  1302      History   Chief Complaint Chief Complaint  Patient presents with  . Heartburn  . Urinary Incontinence  . Fatigue    HPI [name] is a 54 y.o. female.   [name] presents with complaints of "heartburn", decreased appetite, headache, increased urination with elevated blood sugars. History of stroke, afib, chf, gerd, htn, dm. Blood sugars have been "high" and in the 300's. She is on insulin as well as jardiance- although hasn't started this yet as waiting for delivery. States she still has her insulin and has been using it. "Heartburn" currently while at rest, ran out of her medication for this and is awaiting refill in the mail. Drank pediatlye which seemed to help. Heartburn started 2 weeks ago. It comes and goes. Eating doesn't necessarily trigger it. activiity doesn't trigger it. Stopped taking imdur over a week ago, states she is waiting to see her doctor to make sure she still needs to be taking all of her medications. She has been taking keppra, digoxin and xarelto.  No longer on percocet. Hasn't taken her metoprolol today. Nausea, no vomiting. Constipated, stopped taking linzess, uses PRN. Last BM around a week ago. She has been referred to a GI doctor but hasn't seen them yet. She has had urinary incontinence at night, feels like she is too fatigued to get to bathroom.    ROS per HPI, negative if not otherwise mentioned.      Past Medical History:  Diagnosis Date  . Allergic rhinitis   . Arthritis    "hips, back, legs, arms" (07/04/2014)  . Asthma    hx  . Automatic implantable cardioverter-defibrillator in situ   . Calcifying tendinitis of shoulder   . Chronic combined systolic and diastolic CHF (congestive heart failure) ([place])    EF 40-45% by echo 12/06/2012  . Chronic diastolic heart failure (HCC)     Primarily diastolic CHF: Likely due to uncontrolled HTN. Last echo  (8/12) with EF 45-50%, mild to moderate LVH with some asymmetric septal hypertrophy, RV normal size and systolic function. EF 50-55% by LV-gram in 6/12.   . Chronic lower back pain    secondary to DJD, obsetiy, hip problems. Followed by Dr. Oval Linsey (pain management)  . Coronary artery disease    questionable. LHC 05/2011 showing normal coronaries // Followed at [facility], Dr. Aundra Dubin  . Degeneration of lumbar or lumbosacral intervertebral disc   . DJD (degenerative joint disease) of hip    right sided  . Frequent UTI   . GERD (gastroesophageal reflux disease)   . HLD (hyperlipidemia)   . Hypertension    Poorly controlled. Has had HTN since age 40. Angioedema with ACEI.  24 Hr urine and renal arterial dopplers ordered . . . Never done  . LBBB (left bundle branch block)   . Liver disease   . Morbid obesity ([place])   . NICM (nonischemic cardiomyopathy) ([place])    EF 45-50% in 8/12, cath 6/12 showed normal coronaries, EF 50-55% by LV gram  . OSA on CPAP    sleep study in 8/12 showed moderate to severe OSA requiring CPAP  . Polyneuropathy in diabetes(357.2)   . Presence of permanent cardiac pacemaker   . Seizures ([place])    last 3 months  . Shortness of breath    none now  . Stroke [facility]) 12/2013   "my left side is paralyzed" (  07/04/2014)  . Thoracic or lumbosacral neuritis or radiculitis, unspecified   . Type II diabetes mellitus ([place]) DX: 2002    Patient Active Problem List   Diagnosis Date Noted  . Urinary incontinence 02/27/2020  . Contact with and (suspected) exposure to covid-19 02/07/2020  . Atrial fibrillation ([place]) 09/13/2019  . Lumbar radiculopathy 08/03/2017  . Perimenopausal 03/28/2017  . Chronic use of opiate for therapeutic purpose 12/14/2016  . Lumbago 11/22/2016  . Rash 07/14/2016  . Type 2 diabetes mellitus, uncontrolled, with neuropathy ([place]) 02/04/2016  . Left spastic hemiparesis ([place]) 09/23/2015  . Left shoulder pain 06/30/2015  . Localization-related  symptomatic epilepsy and epileptic syndromes with complex partial seizures, not intractable, without status epilepticus ([place]) 06/13/2015  . Hemiparesis affecting left side as late effect of cerebrovascular accident ([place]) 06/13/2015  . Morbid obesity ([place]) 06/13/2015  . [place] intertrigo 11/05/2014  . Tachycardia 07/04/2014  . Biventricular implantable cardiac defibrillator -Medtronic 11/23/2013  . Mixed stress and urge urinary incontinence 10/11/2013  . Health care maintenance 09/13/2013  . Chronic combined systolic and diastolic heart failure ([place]) 08/01/2013  . Nonischemic cardiomyopathy ([place]) 08/01/2013  . LBBB (left bundle branch block) 07/19/2013  . Depression 04/09/2013  . Primary osteoarthritis of left knee 04/14/2012  . Lumbar spondylosis 04/14/2012  . Diabetic peripheral neuropathy ([place]) 04/14/2012  . Hyperlipidemia 11/23/2011  . OSA (obstructive sleep apnea) 08/27/2011  . Benign essential HTN 08/27/2010    Past Surgical History:  Procedure Laterality Date  . BI-VENTRICULAR IMPLANTABLE CARDIOVERTER DEFIBRILLATOR N/A 08/22/2013   Procedure: BI-VENTRICULAR IMPLANTABLE CARDIOVERTER DEFIBRILLATOR  (CRT-D);  Surgeon: Deboraha Sprang, MD;  Location: [facility] CATH LAB;  Service: Cardiovascular;  Laterality: N/A;  . BI-VENTRICULAR IMPLANTABLE CARDIOVERTER DEFIBRILLATOR  (CRT-D)  08/2013   Archie Endo 08/23/2013  . BREAST SURGERY Bilateral 2011   patient reports benign results  . CARDIAC CATHETERIZATION  05/2011  . CARPAL TUNNEL RELEASE Left    denies  . HEMIARTHROPLASTY SHOULDER FRACTURE Right 1980's   denies  . MULTIPLE EXTRACTIONS WITH ALVEOLOPLASTY Bilateral 05/20/2017   Procedure: MULTIPLE EXTRACTION;  Surgeon: Diona Browner, DDS;  Location: [place];  Service: Oral Surgery;  Laterality: Bilateral;  . MULTIPLE TOOTH EXTRACTIONS  ~ 2011   tumors removed ; "my whole top"  . SHOULDER ARTHROSCOPY Right 12/26/2015   Procedure: Right Shoulder Arthroscopy, Debridement, and Decompression;  Surgeon:  Newt Minion, MD;  Location: [place];  Service: Orthopedics;  Laterality: Right;  . TEE WITHOUT CARDIOVERSION N/A 01/14/2014   Procedure: TRANSESOPHAGEAL ECHOCARDIOGRAM (TEE);  Surgeon: Sanda Klein, MD;  Location: [facility] ENDOSCOPY;  Service: Cardiovascular;  Laterality: N/A;  . TUBAL LIGATION  05/31/1985    OB History   No obstetric history on file.      Home Medications    Prior to Admission medications   Medication Sig Start Date End Date Taking? Authorizing Provider  Accu-Chek FastClix Lancets MISC Check blood sugar 2 times a day 09/13/19   Lars Mage, MD  amLODipine (NORVASC) 10 MG tablet TAKE ONE TABLET BY MOUTH EVERY DAY 01/09/20   Al Decant, MD  atorvastatin (LIPITOR) 40 MG tablet TAKE ONE TABLET BY MOUTH EVERY DAY 01/28/20   Velna Ochs, MD  Blood Glucose Monitoring Suppl (ACCU-CHEK GUIDE) w/Device KIT 1 each by Does not apply route 3 (three) times daily. 09/12/19   Chundi, Verne Spurr, MD  calamine lotion Apply 1 application topically as needed for itching. 09/12/19   Chundi, Verne Spurr, MD  cyclobenzaprine (FLEXERIL) 5 MG tablet Take 1 tablet (5 mg total) by mouth 3 (  three) times daily as needed for muscle spasms. 10/02/19   Al Decant, MD  diclofenac Sodium (VOLTAREN) 1 % GEL Apply 2 g topically 4 (four) times daily. 08/15/20   Cato Mulligan, MD  digoxin (LANOXIN) 0.125 MG tablet TAKE ONE TABLET BY MOUTH EVERY DAY 01/09/20   Al Decant, MD  fluticasone (CUTIVATE) 0.05 % cream Apply topically 2 (two) times daily. Dispense two tubes 02/07/20   Al Decant, MD  folic acid (FOLVITE) 1 MG tablet Take 1 tablet (1 mg total) by mouth daily. 07/22/20   [name], MD  furosemide (LASIX) 40 MG tablet TAKE 1 TABLET BY MOUTH TWICE A WEEK 08/11/17   Bensimhon, Shaune Pascal, MD  gabapentin (NEURONTIN) 300 MG capsule Take 1 capsule (300 mg total) by mouth 3 (three) times daily. 08/03/17   Meredith Staggers, MD  glucose blood (ACCU-CHEK GUIDE) test strip Check blood sugar 2 times per day  09/13/19   Lars Mage, MD  Insulin Glargine (LANTUS SOLOSTAR) 100 UNIT/ML Solostar Pen Inject 52 Units into the skin daily at 10 pm. 04/21/17   Minus Liberty, MD  isosorbide mononitrate (IMDUR) 60 MG 24 hr tablet TAKE 1 AND 1/2 TABLETS BY MOUTH DAILY 03/20/20   Al Decant, MD  JARDIANCE 10 MG TABS tablet TAKE ONE TABLET BY MOUTH DAILY BEFORE BREAKFAST 08/19/20   Madalyn Rob, MD  levETIRAcetam (KEPPRA) 500 MG tablet TAKE 1 TABLET BY MOUTH TWO TIMES DAILY **MUST NOT MISS ANY DOSE** 01/09/20   Al Decant, MD  metFORMIN (GLUCOPHAGE) 1000 MG tablet TAKE ONE TABLET BY MOUTH TWO TIMES A DAY WITH A MEAL 01/09/20   Al Decant, MD  metoprolol succinate (TOPROL-XL) 100 MG 24 hr tablet Take 1 tablet (100 mg total) by mouth 2 (two) times daily. Take with or immediately following a meal. 08/10/17   Sid Falcon, MD  nystatin (NYSTATIN) powder Apply 1 application topically 3 (three) times daily. 07/15/20   [name], MD  nystatin cream (MYCOSTATIN) Apply 1 application topically 2 (two) times daily. 07/15/20   [name], MD  oxyCODONE-acetaminophen (PERCOCET) 10-325 MG tablet Take 1 tablet by mouth every 8 (eight) hours as needed for pain. 09/02/17   Bayard Hugger, NP  pantoprazole (PROTONIX) 40 MG tablet Take 1 tablet (40 mg total) by mouth daily. 09/12/19   Chundi, Verne Spurr, MD  rivaroxaban (XARELTO) 20 MG TABS tablet TAKE ONE TABLET BY MOUTH EVERY DAY WITH SUPPER 07/15/20   [name], MD  solifenacin (VESICARE) 10 MG tablet Take 1 tablet (10 mg total) by mouth daily. 07/15/20   [name], MD  spironolactone (ALDACTONE) 50 MG tablet TAKE ONE TABLET BY MOUTH EVERY DAY 01/28/20   Velna Ochs, MD  STOOL SOFTENER/LAXATIVE 50-8.6 MG tablet TAKE ONE TABLET BY MOUTH AT BEDTIME AS NEEDED FOR MILD CONSTIPATION 01/28/20   Velna Ochs, MD  UNIFINE PENTIPS 31G X 5 MM MISC USE TO INJECT INSULIN TWO TIMES DAILY 02/17/17   Minus Liberty, MD  Vitamin D, Ergocalciferol, (DRISDOL) 50000  units CAPS capsule TAKE ONE CAPSULE BY MOUTH EVERY MONDAY 02/17/17   Minus Liberty, MD    Family History Family History  Problem Relation Age of Onset  . Heart disease Mother 51       Died of MI at age 75 yo  . Kidney disease Mother        requiring dialysis  . Congestive Heart Failure Mother   . Heart disease Father 34       MI age 26yo requiring stenting  .  Diabetes Father   . Glaucoma Father   . Heart disease Paternal Grandmother        requiring pacemaker.  [name] Heart disease Paternal Grandfather 75       Died of MI at possibly age 35-53yo  . Stroke Paternal Grandfather   . Diabetes Brother   . Heart disease Brother 3       MI at age 79 years old  . Breast cancer Paternal Aunt   . Breast cancer Maternal Grandmother     Social History Social History   Tobacco Use  . Smoking status: Never Smoker  . Smokeless tobacco: Never Used  Vaping Use  . Vaping Use: Never used  Substance Use Topics  . Alcohol use: No    Alcohol/week: 0.0 standard drinks  . Drug use: No     Allergies   Ace inhibitors, Clindamycin, Enalapril maleate, and Lisinopril   Review of Systems Review of Systems   Physical Exam Triage Vital Signs ED Triage Vitals  Enc Vitals Group     BP 08/27/20 1535 (!) 150/103     Pulse Rate 08/27/20 1535 (!) 104     Resp 08/27/20 1535 14     Temp 08/27/20 1535 98.8 F (37.1 C)     Temp Source 08/27/20 1535 Oral     SpO2 08/27/20 1535 99 %     Weight --      Height --      Head Circumference --      Peak Flow --      Pain Score 08/27/20 1531 9     Pain Loc --      Pain Edu? --      Excl. in [place]? --    No data found.  Updated Vital Signs BP (!) 150/103 (BP Location: Right Arm)   Pulse (!) 104   Temp 98.8 F (37.1 C) (Oral)   Resp 14   SpO2 99%   Visual Acuity Right Eye Distance:   Left Eye Distance:   Bilateral Distance:    Right Eye Near:   Left Eye Near:    Bilateral Near:     Physical Exam Constitutional:      General: She is  not in acute distress.    Appearance: She is well-developed.  HENT:     Mouth/Throat:     Mouth: Mucous membranes are moist.  Cardiovascular:     Rate and Rhythm: Tachycardia present.     Pulses: Normal pulses.  Pulmonary:     Effort: Pulmonary effort is normal.  Skin:    General: Skin is warm and dry.  Neurological:     Mental Status: She is alert and oriented to person, place, and time.     Comments: Baseline left sided deficit     EKG:  Paced ekg, rate of 105 . Previous EKG was available for review. No obviously acute stwave changes as interpreted by me, v2 changes? Reviewed with supervising physician dr. Mannie Stabile  UC Treatments / Results  Labs (all labs ordered are listed, but only abnormal results are displayed) Labs Reviewed  POCT URINALYSIS DIPSTICK, ED / UC - Abnormal; Notable for the following components:      Result Value   Glucose, UA >=1000 (*)    Hgb urine dipstick MODERATE (*)    Leukocytes,Ua SMALL (*)    All other components within normal limits  CBG MONITORING, ED - Abnormal; Notable for the following components:   Glucose-Capillary >600 (*)    All other  components within normal limits    EKG   Radiology No results found.  Procedures Procedures (including critical care time)  Medications Ordered in UC Medications - No data to display  Initial Impression / Assessment and Plan / UC Course  I have reviewed the triage vital signs and the nursing notes.  Pertinent labs & imaging results that were available during my care of the patient were reviewed by me and considered in my medical decision making (see chart for details).    Still with chest pain actively now, no obviously acute changes to ekg. Blood sugar >600 in clinic today. Concern for non compliance with medications. With active chest pain and this blood sugar recommend going to the ER now for further evaluation and management. Patient being transported by her home nurse now.  Final Clinical  Impressions(s) / UC Diagnoses   Final diagnoses:  Chest pain, unspecified type  Hyperglycemia     Discharge Instructions     Go to the ER now    ED Prescriptions    None     PDMP not reviewed this encounter.   Zigmund Gottron, NP 08/27/20 1704

## 2020-08-27 NOTE — ED Notes (Signed)
Patient is being discharged from the Urgent Care Center and sent to the Emergency Department via wheelchair by staff. Per Linus Mako, PA, patient is stable but in need of higher level of care due to hyperglycemia, abnormal EKG. Patient is aware and verbalizes understanding of plan of care.  Vitals:   08/27/20 1535  BP: (!) 150/103  Pulse: (!) 104  Resp: 14  Temp: 98.8 F (37.1 C)  SpO2: 99%

## 2020-09-05 NOTE — Addendum Note (Signed)
Addended by: Neomia Dear on: 09/05/2020 04:44 PM   Modules accepted: Orders

## 2020-09-10 ENCOUNTER — Encounter (HOSPITAL_COMMUNITY): Payer: Medicaid Other | Admitting: Cardiology

## 2020-09-29 ENCOUNTER — Telehealth: Payer: Self-pay

## 2020-09-29 NOTE — Telephone Encounter (Signed)
Requesting to speak with a nurse about getting a Rx for shoe. Please call pt back.

## 2020-10-01 ENCOUNTER — Telehealth: Payer: Self-pay

## 2020-10-01 NOTE — Telephone Encounter (Signed)
Pt needs new diabetic shoes [phone]; pt said shoes are falling apart

## 2020-10-08 ENCOUNTER — Other Ambulatory Visit: Payer: Self-pay | Admitting: Internal Medicine

## 2020-10-08 DIAGNOSIS — E1140 Type 2 diabetes mellitus with diabetic neuropathy, unspecified: Secondary | ICD-10-CM

## 2020-10-08 DIAGNOSIS — I5042 Chronic combined systolic (congestive) and diastolic (congestive) heart failure: Secondary | ICD-10-CM

## 2020-10-08 DIAGNOSIS — IMO0002 Reserved for concepts with insufficient information to code with codable children: Secondary | ICD-10-CM

## 2020-10-10 NOTE — Telephone Encounter (Signed)
Pt states shoes are falling apart, requesting a new Rx for diabetic shoes. Please call pt back.

## 2020-10-13 NOTE — Telephone Encounter (Signed)
I called patient today in reguard to getting a new pair of Diabetic shoes.The shoes she has are falling apart. The patient did get shoes from [facility] that was 2018. The patient is going to have her Aid to bring a form in from the [facility] this week,and we will get a DME order in for Diabetic shoes. The patient has had a foot exam on her last visit [place], [place] C11/1/20214:41 PM

## 2020-10-15 ENCOUNTER — Telehealth: Payer: Self-pay | Admitting: *Deleted

## 2020-10-15 NOTE — Telephone Encounter (Signed)
Marylene Land with [facility] pharmacy called in stating Dr. Everardo Pacific NPI is not registered with medicaid. Rx for vesicare written 07/15/2020 will need name of Attending. Gave today's Attending, Dr. Mikey Bussing. Kinnie Feil, BSN, RN-BC

## 2020-10-16 ENCOUNTER — Other Ambulatory Visit: Payer: Self-pay

## 2020-10-16 DIAGNOSIS — E1140 Type 2 diabetes mellitus with diabetic neuropathy, unspecified: Secondary | ICD-10-CM

## 2020-10-16 DIAGNOSIS — E1165 Type 2 diabetes mellitus with hyperglycemia: Secondary | ICD-10-CM

## 2020-10-16 DIAGNOSIS — IMO0002 Reserved for concepts with insufficient information to code with codable children: Secondary | ICD-10-CM

## 2020-10-16 MED ORDER — ACCU-CHEK FASTCLIX LANCETS MISC: 12 refills | Status: DC

## 2020-10-24 ENCOUNTER — Other Ambulatory Visit: Payer: Self-pay | Admitting: *Deleted

## 2020-10-24 DIAGNOSIS — IMO0002 Reserved for concepts with insufficient information to code with codable children: Secondary | ICD-10-CM

## 2020-10-24 DIAGNOSIS — E1140 Type 2 diabetes mellitus with diabetic neuropathy, unspecified: Secondary | ICD-10-CM

## 2020-10-27 MED ORDER — ACCU-CHEK GUIDE VI STRP
ORAL_STRIP | 10 refills | Status: DC
Start: 2020-10-27 — End: 2021-12-20

## 2020-11-25 ENCOUNTER — Telehealth: Payer: Self-pay

## 2020-11-25 ENCOUNTER — Encounter: Payer: Medicaid Other | Admitting: Internal Medicine

## 2020-11-25 NOTE — Telephone Encounter (Signed)
Please call pt back regarding diabetic shoes.

## 2020-12-02 ENCOUNTER — Other Ambulatory Visit: Payer: Self-pay

## 2020-12-02 ENCOUNTER — Ambulatory Visit (INDEPENDENT_AMBULATORY_CARE_PROVIDER_SITE_OTHER): Payer: Medicaid Other | Admitting: Internal Medicine

## 2020-12-02 VITALS — BP 135/92 | HR 111 | Temp 98.1°F | Ht 72.0 in | Wt 215.8 lb

## 2020-12-02 DIAGNOSIS — E1165 Type 2 diabetes mellitus with hyperglycemia: Secondary | ICD-10-CM | POA: Diagnosis not present

## 2020-12-02 DIAGNOSIS — E1140 Type 2 diabetes mellitus with diabetic neuropathy, unspecified: Secondary | ICD-10-CM | POA: Diagnosis not present

## 2020-12-02 DIAGNOSIS — IMO0002 Reserved for concepts with insufficient information to code with codable children: Secondary | ICD-10-CM

## 2020-12-02 DIAGNOSIS — R350 Frequency of micturition: Secondary | ICD-10-CM

## 2020-12-02 LAB — POCT GLYCOSYLATED HEMOGLOBIN (HGB A1C): Hemoglobin A1C: 11.6 % — AB (ref 4.0–5.6)

## 2020-12-02 LAB — GLUCOSE, CAPILLARY: Glucose-Capillary: 448 mg/dL — ABNORMAL HIGH (ref 70–99)

## 2020-12-02 MED ORDER — TRULICITY 0.75 MG/0.5ML ~~LOC~~ SOAJ: 0.7500 mg | SUBCUTANEOUS | 0 refills | Status: AC

## 2020-12-02 NOTE — Patient Instructions (Addendum)
To Ms. Karnes,   It was a pleasure meeting you today! Today we discussed your diabetes medications and urinary symptoms. Today we will stop your Jardiance and start you on Trulicity, a once a week injectable. Please follow the directions as indicated on the label.We will see you back in 1 week! Happy Holidays! Sincerely,  Dolan Amen, MD

## 2020-12-02 NOTE — Telephone Encounter (Signed)
I have faxed records to [facility] [phone] for Diabetic Shoes and Insert. The phone number is [phone] Filomena Jungling C12/21/20215:06 PM

## 2020-12-03 ENCOUNTER — Other Ambulatory Visit: Payer: Self-pay

## 2020-12-03 ENCOUNTER — Encounter: Payer: Self-pay | Admitting: Internal Medicine

## 2020-12-03 DIAGNOSIS — I4891 Unspecified atrial fibrillation: Secondary | ICD-10-CM

## 2020-12-03 LAB — URINALYSIS, ROUTINE W REFLEX MICROSCOPIC
Bilirubin, UA: NEGATIVE
Ketones, UA: NEGATIVE
Nitrite, UA: NEGATIVE
Protein,UA: NEGATIVE
Specific Gravity, UA: 1.02 (ref 1.005–1.030)
Urobilinogen, Ur: 0.2 mg/dL (ref 0.2–1.0)
pH, UA: 5 (ref 5.0–7.5)

## 2020-12-03 LAB — MICROSCOPIC EXAMINATION
Casts: NONE SEEN /lpf
RBC, Urine: >30 /hpf — AB (ref 0–2)
WBC, UA: >30 /hpf — AB (ref 0–5)

## 2020-12-03 MED ORDER — RIVAROXABAN 20 MG PO TABS: ORAL_TABLET | ORAL | 2 refills | Status: DC

## 2020-12-03 NOTE — Assessment & Plan Note (Signed)
Patient complains of urinary frequency in addition to her urinary incontinence for the past 3-4 months. She states that she is unable to make it to her bathroom or bedside commode before urinating on herself. She also endorses dehydration and increased thirst. She drinks "plenty" of water and Pedialyte, but feels that she needs to continue drinking more fluids.   A/P:  Patient with complaints of 3-4 months of urinary frequency in the setting of urinary incontinence. Her symptoms seem to have begun after starting Jardiance 10 mg after her last office visit for her diabetes with an A1c of 13. In addition to initiating an SGLT2-I, she also has a history frequent UTIs. Will DC Jardiance today, and in its stead, will start her on trulicity. Will check a urinalysis today as well, but her narrative and complaints align with glucosuria.  - Urinary analysis and culture - DC Jardiance - Follow up in 1 week

## 2020-12-03 NOTE — Progress Notes (Signed)
CC: Urinary Frequency   HPI:  [name] is a 54 y.o. M/F, with a PMH noted below, who presents to the clinic urinary frequency. To see the management of their acute and chronic conditions, please see the A&P note under the Encounters tab.   Additionally, she comes with multiple complaints today, including needing further assistance from her nurse/aide at home, continued pain management, DME products, which we were unable to discuss today given her presenting symptoms. Follow up in a week for an hour slot.  Past Medical History:  Diagnosis Date  . Allergic rhinitis   . Arthritis    "hips, back, legs, arms" (07/04/2014)  . Asthma    hx  . Automatic implantable cardioverter-defibrillator in situ   . Calcifying tendinitis of shoulder   . Chronic combined systolic and diastolic CHF (congestive heart failure) (HCC)    EF 40-45% by echo 12/06/2012  . Chronic diastolic heart failure (HCC)     Primarily diastolic CHF: Likely due to uncontrolled HTN. Last echo (8/12) with EF 45-50%, mild to moderate LVH with some asymmetric septal hypertrophy, RV normal size and systolic function. EF 50-55% by LV-gram in 6/12.   . Chronic lower back pain    secondary to DJD, obsetiy, hip problems. Followed by Dr. Ivory Broad (pain management)  . Coronary artery disease    questionable. LHC 05/2011 showing normal coronaries // Followed at [facility], Dr. Shirlee Latch  . Degeneration of lumbar or lumbosacral intervertebral disc   . DJD (degenerative joint disease) of hip    right sided  . Frequent UTI   . GERD (gastroesophageal reflux disease)   . HLD (hyperlipidemia)   . Hypertension    Poorly controlled. Has had HTN since age 67. Angioedema with ACEI.  24 Hr urine and renal arterial dopplers ordered . . . Never done  . LBBB (left bundle branch block)   . Liver disease   . Morbid obesity (HCC)   . NICM (nonischemic cardiomyopathy) (HCC)    EF 45-50% in 8/12, cath 6/12 showed normal  coronaries, EF 50-55% by LV gram  . OSA on CPAP    sleep study in 8/12 showed moderate to severe OSA requiring CPAP  . Polyneuropathy in diabetes(357.2)   . Presence of permanent cardiac pacemaker   . Seizures (HCC)    last 3 months  . Shortness of breath    none now  . Stroke [facility]) 12/2013   "my left side is paralyzed" (07/04/2014)  . Thoracic or lumbosacral neuritis or radiculitis, unspecified   . Type II diabetes mellitus (HCC) DX: 2002   Review of Systems:   Review of Systems  Constitutional: Negative.   HENT: Negative.   Eyes: Negative.   Respiratory: Negative.   Cardiovascular: Negative.   Genitourinary: Positive for frequency and urgency. Negative for dysuria, flank pain and hematuria.  Musculoskeletal: Negative.   Skin: Negative.   Neurological: Negative.   Endo/Heme/Allergies: Negative.   Psychiatric/Behavioral: Negative.      Physical Exam:  Vitals:   12/02/20 1601  BP: (!) 135/92  Pulse: (!) 111  Temp: 98.1 F (36.7 C)  TempSrc: Oral  SpO2: 100%  Weight: 215 lb 12.8 oz (97.9 kg)  Height: 6' (1.829 m)   Physical Exam Constitutional:      General: She is not in acute distress.    Appearance: Normal appearance. She is not ill-appearing or toxic-appearing.  HENT:     Head: Normocephalic and atraumatic.  Cardiovascular:     Rate and  Rhythm: Normal rate and regular rhythm.     Pulses: Normal pulses.     Heart sounds: Normal heart sounds. No murmur heard. No friction rub. No gallop.   Pulmonary:     Effort: Pulmonary effort is normal.     Breath sounds: Normal breath sounds. No wheezing, rhonchi or rales.  Abdominal:     General: Abdomen is flat. Bowel sounds are normal.     Palpations: Abdomen is soft.  Musculoskeletal:     Right lower leg: No edema.     Left lower leg: No edema.  Neurological:     Mental Status: She is alert and oriented to person, place, and time.  Psychiatric:        Mood and Affect: Mood normal.        Behavior: Behavior  normal.      Assessment & Plan:   See Encounters Tab for problem based charting.  Patient discussed with Dr. Mikey Bussing

## 2020-12-03 NOTE — Assessment & Plan Note (Signed)
Patient with Hx of DM. A1c today is 11.6. She states that her sugars are still currently uncontrolled with her readings being in the 200s, but do increase to "HIGH" on her meter, which she did not bring today. She was started on Jardiance 10 mg last time, but unfortunately she has increased urinary frequency today, with a history of urinary incontinence. She is also on Lantus 52 units and refuses her Metformin because she believes that there is an association between metformin and her seizures. Will DC her Jardiance and start trulicity. Will have her follow up in one week's time for her other complaints at today's visit.  - DC Jardiance 2/2 to side effects - Start Trulicity 0.75mg /0.78mL weekly - DC Metformin  - Follow up in one week's time - A1c in 3 months.

## 2020-12-03 NOTE — Telephone Encounter (Signed)
I have faxed records to [facility] [phone] for Diabetic Shoes and Insert. The phone number is [phone] [place], Mamie C12/21/20215:06 PM  

## 2020-12-08 LAB — URINE CULTURE

## 2020-12-17 NOTE — Progress Notes (Signed)
Internal Medicine Clinic Attending  Case discussed with Dr. Sande Brothers  At the time of the visit.  We reviewed the resident's history and exam and pertinent patient test results.  I agree with the assessment, diagnosis, and plan of care documented in the resident's note. UA with microscopic hematuria and pyuria as well as bacteruria and yeast however culture is not revealing other than normal genitourinary flora. Agree with stopping jardiance, would repeat U/A in a few weeks or sooner if symptoms do not resolve with cessation of jardiance.

## 2020-12-25 ENCOUNTER — Encounter: Payer: Medicaid Other | Admitting: Internal Medicine

## 2020-12-30 ENCOUNTER — Other Ambulatory Visit: Payer: Self-pay | Admitting: Student

## 2021-01-08 ENCOUNTER — Other Ambulatory Visit: Payer: Self-pay | Admitting: Internal Medicine

## 2021-01-08 DIAGNOSIS — I5042 Chronic combined systolic (congestive) and diastolic (congestive) heart failure: Secondary | ICD-10-CM

## 2021-01-08 DIAGNOSIS — E1140 Type 2 diabetes mellitus with diabetic neuropathy, unspecified: Secondary | ICD-10-CM

## 2021-01-08 DIAGNOSIS — IMO0002 Reserved for concepts with insufficient information to code with codable children: Secondary | ICD-10-CM

## 2021-01-13 ENCOUNTER — Other Ambulatory Visit: Payer: Self-pay | Admitting: Student

## 2021-01-19 ENCOUNTER — Telehealth: Payer: Self-pay

## 2021-01-19 NOTE — Telephone Encounter (Signed)
Patient reports her ICD has been beeping x4 days in the am. Denies any recent complaints. Patient was last seen by Dr. Graciela Husbands in 2016. No other follow up. Patient will need to be reestablished. Last remote was 01/13/2019 and her battery had 19 months left. More than likely her device has reached RRT. Total VP 97.9% during last remote on 01/13/2019.

## 2021-01-19 NOTE — Telephone Encounter (Signed)
The pt states her ICD has been beeping for a week. She has not been seen in office since 2016 and she has not been doing remotes. I asked [place] to schedule her an in office visit with Graciela Husbands or app. I also let her speak with Leigh, rn.

## 2021-01-20 NOTE — Progress Notes (Incomplete)
Patient ID: [name], female   DOB: 06-17-1966, 55 y.o.   MRN: [id number]    Advanced Heart Failure Clinic Note   EP: Dr Caryl Comes PCP: Dr Catarina [facility] HF Cardiologist: Dr Aundra Dubin    HPI: 55 yo with history of morbid obesity, HTN, HLD, CAD, DM2, OSA on CPAP, CVA and chronic systolic CHF in the setting of poorly-controlled HTN. She was admitted in 05/2011 with chest pain and new LBBB and ended up getting a left heart cath, which showed no angiographic CAD. Renal artery dopplers were normal in 04/2012.  She has a Medtronic CRT-D device.   Was hospitalized May 2014 with acute on chronic combined systolic and diastolic CHF. EF from December 2013 was down to 40 to 45%.   Admitted in January 2015 with slurred speech, L arm weakness and facial droop. Initial head CT showed no acute abnormality. Repeat head CT showed subtle gray white matter differentiation in R frontal lobe and R operculum perhaps representing ischemic R sided MCA. STAT repeat CT head later on 1/29 showed further evolution of R MCA territory infarct involving R insular cortex and operculum. No hemorrhage. Neurology recommended CTA head/neck, which showed occlusion of MCA branch to the area of R MCA infarct and a normal neck CTA. TEE was done and did not reveal embolic source. Was discharged to inpatient rehab.  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  She was started on Keppra.  She went to the ER in 2/16 with syncope that was probably due to seizure, no arrhythmias with device interrogation.  Echo in 11/16 showed EF back down to 25-30%.    She has been limited since her stroke, walks in house with cane and uses wheelchair for long distances. No dyspnea walking in house.  She sleeps on 4 pillows now, used to sleep on 9.  No PND.  No chest pain.  No lightheadedness or palpitations.  She is using CPAP.  Echo today showed improvement in EF to 45%.   Today she returns for HF  follow up. Has not been seen in clinic since 07/2017. Overall feeling fine. Denies increasing SOB, CP, dizziness, edema, or PND/Orthopnea. Appetite ok. No fever or chills. Weight at home 170 pounds. Taking all medications.   Device interrogation: >99% BiV pacing, fluid index trending up, impedance trending down.  She had a run of atrial fibrillation in 11/17.   ECG:  ECHO 11/2012: 40-45% ECHO 07/2013: 20% RV normal ECHO 08/23/2013: EF 40-45%, diff HK ECHO 09/05/2013: EF 25-30%, diff HK  ECHO 12/11/13: EF 30%, diff HK, RV normal ECHO 01/14/2014: EF 35-40%, LA mod dilated  ECHO 4/16: EF 40-45%, moderate LVH, normal RV size with mildly decreased systolic function.   ECHO 11/16: EF 25-30%, moderate LVH ECHO 2/18: EF 45%, moderate LVH, normal RV size and systolic function.   Labs (06/2013): proBNP 230, Cr 0.9, K 3.7 Labs (08/2013): K+ 3.9, Cr 0.9 Labs  (09/2013): K 4.2, Cr 0.86, pro-BNP 718, dig level <0.3 Labs (10/07/13): K+ 4.1, Cr 0.83 Labs (05/08/14): K 4.0 Creatinine 0.67 Pro BNP 233 Dig level 0.3  Labs (08/20/2014): K 3.8,Creatinine 0.56  Labs (2/16): K 3.6, creatinine 0.95, LDL 106, HDL 43 Labs (3/16): K 3.5, creatinine 0.87, HCT 35.9 Labs (7/16): K 3.5, creatinine 0.78. HCT 37 Labs (10/16): digoxin 0.2, LDL 116 Labs (1/17): K 3.6, creatinine 0.77, HCT 38 Labs (5/17): digoxin 0.9 Labs (8/17): K 4.2, creatinine 0.68, HCT 35.7 Labs (11/17): K  3.8, creatinine 0.76  ROS: All systems negative except as listed in HPI, PMH and Problem List.  Past Medical History:  Diagnosis Date  . Allergic rhinitis   . Arthritis    "hips, back, legs, arms" (07/04/2014)  . Asthma    hx  . Automatic implantable cardioverter-defibrillator in situ   . Calcifying tendinitis of shoulder   . Chronic combined systolic and diastolic CHF (congestive heart failure) ([place])    EF 40-45% by echo 12/06/2012  . Chronic diastolic heart failure (HCC)     Primarily diastolic CHF: Likely due to uncontrolled HTN. Last echo  (8/12) with EF 45-50%, mild to moderate LVH with some asymmetric septal hypertrophy, RV normal size and systolic function. EF 50-55% by LV-gram in 6/12.   . Chronic lower back pain    secondary to DJD, obsetiy, hip problems. Followed by Dr. Oval Linsey (pain management)  . Coronary artery disease    questionable. LHC 05/2011 showing normal coronaries // Followed at [facility], Dr. Aundra Dubin  . Degeneration of lumbar or lumbosacral intervertebral disc   . DJD (degenerative joint disease) of hip    right sided  . Frequent UTI   . GERD (gastroesophageal reflux disease)   . HLD (hyperlipidemia)   . Hypertension    Poorly controlled. Has had HTN since age 37. Angioedema with ACEI.  24 Hr urine and renal arterial dopplers ordered . . . Never done  . LBBB (left bundle branch block)   . Liver disease   . Morbid obesity ([place])   . NICM (nonischemic cardiomyopathy) ([place])    EF 45-50% in 8/12, cath 6/12 showed normal coronaries, EF 50-55% by LV gram  . OSA on CPAP    sleep study in 8/12 showed moderate to severe OSA requiring CPAP  . Polyneuropathy in diabetes(357.2)   . Presence of permanent cardiac pacemaker   . Seizures ([place])    last 3 months  . Shortness of breath    none now  . Stroke [facility]) 12/2013   "my left side is paralyzed" (07/04/2014)  . Thoracic or lumbosacral neuritis or radiculitis, unspecified   . Type II diabetes mellitus (HCC) DX: 2002    Current Outpatient Medications  Medication Sig Dispense Refill  . Accu-Chek FastClix Lancets MISC Check blood sugar 2 times a day 102 each 12  . amLODipine (NORVASC) 10 MG tablet TAKE ONE TABLET BY MOUTH EVERY DAY 90 tablet 0  . atorvastatin (LIPITOR) 40 MG tablet TAKE ONE TABLET BY MOUTH EVERY DAY 90 tablet 0  . Blood Glucose Monitoring Suppl (ACCU-CHEK GUIDE) w/Device KIT 1 each by Does not apply route 3 (three) times daily. 1 kit 1  . calamine lotion Apply 1 application topically as needed for itching. 120 mL 0  . cyclobenzaprine  (FLEXERIL) 5 MG tablet Take 1 tablet (5 mg total) by mouth 3 (three) times daily as needed for muscle spasms. 15 tablet 0  . diclofenac Sodium (VOLTAREN) 1 % GEL APPLY 2 GRAMS TOPICALLY 4 (FOUR) TIMES DAILY. 300 g 1  . digoxin (LANOXIN) 0.125 MG tablet TAKE ONE TABLET BY MOUTH EVERY DAY 90 tablet 0  . fluticasone (CUTIVATE) 0.05 % cream Apply topically 2 (two) times daily. Dispense two tubes 30 g 6  . folic acid (FOLVITE) 1 MG tablet TAKE ONE TABLET BY MOUTH DAILY 90 tablet 1  . furosemide (LASIX) 40 MG tablet TAKE 1 TABLET BY MOUTH TWICE A WEEK 10 tablet 3  . gabapentin (NEURONTIN) 300 MG capsule Take 1 capsule (300  mg total) by mouth 3 (three) times daily. 90 capsule 5  . glucose blood (ACCU-CHEK GUIDE) test strip Check blood sugar 2 times per day 100 each 10  . Insulin Glargine (LANTUS SOLOSTAR) 100 UNIT/ML Solostar Pen Inject 52 Units into the skin daily at 10 pm. 30 mL 6  . isosorbide mononitrate (IMDUR) 60 MG 24 hr tablet TAKE 1 AND 1/2 TABLETS BY MOUTH DAILY 135 tablet 1  . JARDIANCE 10 MG TABS tablet TAKE ONE TABLET BY MOUTH DAILY BEFORE BREAKFAST 30 tablet 2  . levETIRAcetam (KEPPRA) 500 MG tablet TAKE 1 TABLET BY MOUTH TWO TIMES DAILY **MUST NOT MISS ANY DOSE** 180 tablet 0  . metFORMIN (GLUCOPHAGE) 1000 MG tablet TAKE ONE TABLET BY MOUTH TWO TIMES A DAY WITH A MEAL 180 tablet 0  . metoprolol succinate (TOPROL-XL) 100 MG 24 hr tablet Take 1 tablet (100 mg total) by mouth 2 (two) times daily. Take with or immediately following a meal. 180 tablet 1  . nystatin (NYSTATIN) powder Apply 1 application topically 3 (three) times daily. 15 g 6  . nystatin cream (MYCOSTATIN) Apply 1 application topically 2 (two) times daily. 30 g 0  . oxyCODONE-acetaminophen (PERCOCET) 10-325 MG tablet Take 1 tablet by mouth every 8 (eight) hours as needed for pain. 90 tablet 0  . pantoprazole (PROTONIX) 40 MG tablet Take 1 tablet (40 mg total) by mouth daily. 90 tablet 1  . rivaroxaban (XARELTO) 20 MG TABS tablet  TAKE ONE TABLET BY MOUTH EVERY DAY WITH SUPPER 30 tablet 2  . solifenacin (VESICARE) 10 MG tablet Take 1 tablet (10 mg total) by mouth daily. 30 tablet 5  . spironolactone (ALDACTONE) 50 MG tablet TAKE ONE TABLET BY MOUTH EVERY DAY 90 tablet 0  . STOOL SOFTENER/LAXATIVE 50-8.6 MG tablet TAKE ONE TABLET BY MOUTH AT BEDTIME AS NEEDED FOR MILD CONSTIPATION 30 tablet 0  . UNIFINE PENTIPS 31G X 5 MM MISC USE TO INJECT INSULIN TWO TIMES DAILY 100 each 0  . Vitamin D, Ergocalciferol, (DRISDOL) 50000 units CAPS capsule TAKE ONE CAPSULE BY MOUTH EVERY MONDAY 4 capsule 0   No current facility-administered medications for this visit.   There were no vitals filed for this visit.  Wt Readings from Last 3 Encounters:  12/02/20 97.9 kg (215 lb 12.8 oz)  07/15/20 107.5 kg (237 lb)  10/02/19 113.4 kg (250 lb 1.6 oz)     PHYSICAL EXAM: General:  NAD. No resp difficulty HEENT: Normal Neck: Supple. No JVD. Carotids 2+ bilat; no bruits. No lymphadenopathy or thryomegaly appreciated. Cor: PMI nondisplaced. Regular rate & rhythm. No rubs, gallops or murmurs. Lungs: Clear Abdomen: Soft, nontender, nondistended. No hepatosplenomegaly. No bruits or masses. Good bowel sounds. Extremities: No cyanosis, clubbing, rash, edema Neuro: alert & oriented x 3, cranial nerves grossly intact. Moves all 4 extremities w/o difficulty. Affect pleasant.   ASSESSMENT/PLAN: 1. Chronic systolic HF: Nonischemic cardiomyopathy, normal coronaries on 6/12 angiography.  Cardiomyopathy may be due to long-standing and poorly-controlled HTN.  Echo 11/16 EF 25-30%, echo 2/18 with EF improved to 45%. S/P CRT-D Medtronic. NYHA class II-III symptoms, Volume status looks ok on exam but appears to be developing volume overload based on Optivol.   - She has not been taking Lasix.  She needs to go back to taking it twice weekly.  BMET/BNP today.  - Continue current spironolactone, digoxin, and Toprol XL.  Check digoxin level today.   - Continue  hydralazine 75 mg tid and Imdur 90 mg daily.   -  No ACE-I/ARB/ARNI due to angioedema.   - Off Jardiance with recent UTIs 2. HTN: BP appears controlled. 3. Morbid obesity: Exercise limited. Needs to be more active.   4. OSA on CPAP:  Using CPAP.   5. Atrial fibrillation: Episode of atrial fibrillation in 11/17 noted on device interrogation. Patient had a CVA in the past. CHADSVASC = 5.  I am going to have her stop ASA and start Xarelto 20 mg daily.  6. DM2:  Follow up in 3-4 weeks.  Maricela [facility] [facility] FNP-BC 01/20/2021

## 2021-01-21 ENCOUNTER — Encounter (HOSPITAL_COMMUNITY): Payer: Medicaid Other

## 2021-01-22 ENCOUNTER — Telehealth: Payer: Self-pay | Admitting: *Deleted

## 2021-01-22 NOTE — Telephone Encounter (Signed)
Patient is scheduled with Dr. Graciela Husbands 02/11/21 to be reestablished.

## 2021-01-22 NOTE — Telephone Encounter (Signed)
Information wassent through [employer] for PA for [employer].  Approved 01/22/2021 thru 01/22/2022.  PA [id number].  Angelina Ok, RN 01/22/2021 2:04 PM.

## 2021-02-03 ENCOUNTER — Telehealth: Payer: Self-pay

## 2021-02-03 ENCOUNTER — Encounter (HOSPITAL_COMMUNITY): Payer: Medicaid Other

## 2021-02-03 NOTE — Telephone Encounter (Signed)
Spoke with patient today she wanted to know if we can order her another monitor since hers got ruined,  and it was ordered today and it will take 7-10 business days. Patient also states her ICD keeps alarming every morning at the same time and she says she can deal with it until her appointment with [facility] on 02/11/2021. Patient aware when her monitor arrives to plug it in for at least an hour and then go ahead and send a remote transmission.

## 2021-02-04 ENCOUNTER — Encounter: Payer: Medicaid Other | Admitting: Student

## 2021-02-04 ENCOUNTER — Telehealth: Payer: Self-pay | Admitting: *Deleted

## 2021-02-04 NOTE — Telephone Encounter (Signed)
Call transferred from front office - pt stated she was not allowed by come to her appt from [facility] entrance b/c she had a child with her. She told front office she had been waiting since 1500 PM. Stated she has a very bad yeast infection and needs refills; needs to be seen today. No appts this afternoon. Suggested going to UC for the yeast infection; stated she did not want to go d/t long wait time. She's agreeable to wait until tomorrow; appt scheduled at 1545 PM w/ Dr Austin Miles. Instructed not to bring child w/her; stated understanding.

## 2021-02-05 ENCOUNTER — Encounter: Payer: Self-pay | Admitting: Student

## 2021-02-05 ENCOUNTER — Other Ambulatory Visit (HOSPITAL_COMMUNITY)
Admission: RE | Admit: 2021-02-05 | Discharge: 2021-02-05 | Disposition: A | Discharge status: Home / Self Care | Payer: Medicaid Other | Source: Ambulatory Visit | Attending: Student in an Organized Health Care Education/Training Program | Admitting: Student in an Organized Health Care Education/Training Program

## 2021-02-05 ENCOUNTER — Ambulatory Visit: Payer: Medicaid Other | Admitting: Student

## 2021-02-05 VITALS — BP 171/103 | HR 114 | Temp 98.7°F | Wt 235.1 lb

## 2021-02-05 DIAGNOSIS — E1140 Type 2 diabetes mellitus with diabetic neuropathy, unspecified: Secondary | ICD-10-CM | POA: Diagnosis not present

## 2021-02-05 DIAGNOSIS — N898 Other specified noninflammatory disorders of vagina: Secondary | ICD-10-CM

## 2021-02-05 DIAGNOSIS — R8290 Unspecified abnormal findings in urine: Secondary | ICD-10-CM

## 2021-02-05 DIAGNOSIS — I5042 Chronic combined systolic (congestive) and diastolic (congestive) heart failure: Secondary | ICD-10-CM

## 2021-02-05 DIAGNOSIS — R350 Frequency of micturition: Secondary | ICD-10-CM | POA: Insufficient documentation

## 2021-02-05 DIAGNOSIS — G8114 Spastic hemiplegia affecting left nondominant side: Secondary | ICD-10-CM

## 2021-02-05 DIAGNOSIS — I10 Essential (primary) hypertension: Secondary | ICD-10-CM

## 2021-02-05 DIAGNOSIS — E1165 Type 2 diabetes mellitus with hyperglycemia: Secondary | ICD-10-CM | POA: Diagnosis not present

## 2021-02-05 DIAGNOSIS — IMO0002 Reserved for concepts with insufficient information to code with codable children: Secondary | ICD-10-CM

## 2021-02-05 HISTORY — DX: Other specified noninflammatory disorders of vagina: N89.8

## 2021-02-05 LAB — POCT URINALYSIS DIPSTICK
Bilirubin, UA: NEGATIVE
Blood, UA: NEGATIVE
Glucose, UA: NEGATIVE
Ketones, UA: NEGATIVE
Nitrite, UA: NEGATIVE
Protein, UA: NEGATIVE
Spec Grav, UA: 1.015 (ref 1.010–1.025)
Urobilinogen, UA: 0.2 E.U./dL
pH, UA: 7 (ref 5.0–8.0)

## 2021-02-05 MED ORDER — SPIRONOLACTONE 50 MG PO TABS: 50.0000 mg | ORAL_TABLET | Freq: Every day | ORAL | 3 refills | Status: DC

## 2021-02-05 MED ORDER — ISOSORBIDE MONONITRATE ER 60 MG PO TB24: 90.0000 mg | ORAL_TABLET | Freq: Every day | ORAL | 2 refills | Status: DC

## 2021-02-05 MED ORDER — PANTOPRAZOLE SODIUM 40 MG PO TBEC: 40.0000 mg | DELAYED_RELEASE_TABLET | Freq: Every day | ORAL | 3 refills | Status: DC

## 2021-02-05 MED ORDER — AMLODIPINE BESYLATE 10 MG PO TABS: 10.0000 mg | ORAL_TABLET | Freq: Every day | ORAL | 3 refills | Status: DC

## 2021-02-05 MED ORDER — TRULICITY 0.75 MG/0.5ML ~~LOC~~ SOAJ: 0.7500 mg | SUBCUTANEOUS | 4 refills | Status: DC

## 2021-02-05 NOTE — Patient Instructions (Signed)
[name],  It was a pleasure seeing you in the clinic today.   We discussed the following today:  1. We collected some urine to figure out what is causing your symptoms. I will call you when I have the results. In the meantime, please go back to using [place] products so that you have less irritation.  2. I have refilled your medications. Please make sure you take all of your blood pressure medications every day.  3. I have placed the order for your walking brace.  4. I will schedule a telehealth visit to discuss your pain management.  Please call our clinic at [phone] if you have any questions or concerns. The best time to call is Monday-Friday from 9am-4pm, but there is someone available 24/7 at the same number. If you need medication refills, please notify your pharmacy one week in advance and they will send [place] a request.   Thank you for letting us take part in your care. We look forward to seeing you next time!

## 2021-02-05 NOTE — Assessment & Plan Note (Signed)
Patient presenting today with complaints of a yeast infection. States that she has been having increasing urinary frequency (although she notes that her diabetes is uncontrolled so that may be causing it too), vaginal discharge, and a "sour" odor that has been going on for about 2 months. She states that when she showers she notices "skin flakes" being washed out and she also notes clear vaginal discharge seen on her briefs. Furthermore, she notes that she smells a very sour odor from time to time as well. Denies burning, dysuria, hematuria, creamy white vaginal discharge. She does report that she has had yeast infections in the past when her diabetes is uncontrolled and thinks that is what is happening now. She endorses being sexually active, although infrequently, and she does use condoms for protection every time. Of note, she does note that her urinary discharge/odor worsened after switching products from [place] to another stronger smelling product.   At this time, I am unsure if she has a yeast infection or if it is an STI. She had a prior urinalysis done that showed multiple flora, which can be seen with BV, and also had yeast present at that time. Today, I will order a urinalysis with reflex, urine cultures, and a urine cytology to check for gonorrhea, chlamydia, trichomonas, and BV in order to determine appropriate antibiotic therapy. Patient confirms understanding and agrees with plan. Also advised to switch back to a product like Dove that does not cause as much irritation for her.  Plan: -f/u urinalysis with reflex, urine cultures, and urine cytology -once resulted, will prescribe appropriate antimicrobial coverage

## 2021-02-05 NOTE — Assessment & Plan Note (Signed)
Today's Vitals   02/05/21 1554 02/05/21 1605  BP: (!) 171/103   Pulse: (!) 114   Temp: 98.7 F (37.1 C)   TempSrc: Oral   SpO2: 100%   Weight: 235 lb 1.6 oz (106.6 kg)   PainSc:  8    Body mass index is 31.89 kg/m.  Patient with history of HTN on multiple medications including norvasc 10mg , toprol xl 100mg , lasix 40mg , spironolactone 50mg , and imdur 60mg . BP today elevated at 171/103, but patient reports that she has not taken any BP meds today. She also needs refills on her norvasc, spiro, and imdur. Emphasized importance of taking BP meds every single day to make sure that her BP is well controlled given history of prior stroke. She confirms understanding and states that she will. I have refilled her BP medications.  Plan: -refilled norvasc, spiro, and imdur -continue current meds -assess BP at next office visit with goal <130/80

## 2021-02-05 NOTE — Progress Notes (Signed)
CC: yeast infection  HPI:  [name] is a 55 y.o. female with history listed below presenting to the [facility] for a suspected yeast infection. Please see individualized problem based charting for full HPI.  Of note, patient was recently approved for diabetic shoes given her uncontrolled T2DM with peripheral neuropathy. However, she does need to have a brace on her left leg for stability because she does have a history of left spastic hemiparesis. She reports that since her stroke, when she wears shoes her ankle turns. Therefore, a brace will help provide more support with her balance when she walks with her cane.  Past Medical History:  Diagnosis Date  . Allergic rhinitis   . Arthritis    "hips, back, legs, arms" (07/04/2014)  . Asthma    hx  . Automatic implantable cardioverter-defibrillator in situ   . Calcifying tendinitis of shoulder   . Chronic combined systolic and diastolic CHF (congestive heart failure) (HCC)    EF 40-45% by echo 12/06/2012  . Chronic diastolic heart failure (HCC)     Primarily diastolic CHF: Likely due to uncontrolled HTN. Last echo (8/12) with EF 45-50%, mild to moderate LVH with some asymmetric septal hypertrophy, RV normal size and systolic function. EF 50-55% by LV-gram in 6/12.   . Chronic lower back pain    secondary to DJD, obsetiy, hip problems. Followed by Dr. Ivory Broad (pain management)  . Coronary artery disease    questionable. LHC 05/2011 showing normal coronaries // Followed at [facility], Dr. Shirlee Latch  . Degeneration of lumbar or lumbosacral intervertebral disc   . DJD (degenerative joint disease) of hip    right sided  . Frequent UTI   . GERD (gastroesophageal reflux disease)   . HLD (hyperlipidemia)   . Hypertension    Poorly controlled. Has had HTN since age 45. Angioedema with ACEI.  24 Hr urine and renal arterial dopplers ordered . . . Never done  . LBBB (left bundle branch block)   . Liver disease   . Morbid obesity  (HCC)   . NICM (nonischemic cardiomyopathy) (HCC)    EF 45-50% in 8/12, cath 6/12 showed normal coronaries, EF 50-55% by LV gram  . OSA on CPAP    sleep study in 8/12 showed moderate to severe OSA requiring CPAP  . Polyneuropathy in diabetes(357.2)   . Presence of permanent cardiac pacemaker   . Seizures (HCC)    last 3 months  . Shortness of breath    none now  . Stroke [facility]) 12/2013   "my left side is paralyzed" (07/04/2014)  . Thoracic or lumbosacral neuritis or radiculitis, unspecified   . Type II diabetes mellitus (HCC) DX: 2002    Review of Systems:  Negative aside from that listed in individualized problem based charting.  Physical Exam:  Vitals:   02/05/21 1554  BP: (!) 171/103  Pulse: (!) 114  Temp: 98.7 F (37.1 C)  TempSrc: Oral  SpO2: 100%  Weight: 235 lb 1.6 oz (106.6 kg)   Physical Exam Constitutional:      Appearance: She is obese.  HENT:     Head: Normocephalic and atraumatic.     Mouth/Throat:     Mouth: Mucous membranes are moist.     Pharynx: Oropharynx is clear. No oropharyngeal exudate.  Eyes:     Extraocular Movements: Extraocular movements intact.     Conjunctiva/sclera: Conjunctivae normal.     Pupils: Pupils are equal, round, and reactive to light.  Cardiovascular:  Rate and Rhythm: Regular rhythm. Tachycardia present.     Pulses: Normal pulses.     Heart sounds: Normal heart sounds. No murmur heard. No friction rub. No gallop.   Pulmonary:     Effort: Pulmonary effort is normal.     Breath sounds: Normal breath sounds. No wheezing, rhonchi or rales.  Abdominal:     General: Bowel sounds are normal. There is no distension.     Palpations: Abdomen is soft.     Tenderness: There is no abdominal tenderness.  Genitourinary:    Comments: Refused GU exam Musculoskeletal:        General: Swelling present. Normal range of motion.     Cervical back: Normal range of motion.     Comments: Swelling in bilateral lower extremities, chronic.    Skin:    General: Skin is warm and dry.  Neurological:     Mental Status: She is alert and oriented to person, place, and time. Mental status is at baseline.     Comments: Left sided hemiparesis.  Psychiatric:        Mood and Affect: Mood normal.        Behavior: Behavior normal.      Assessment & Plan:   See Encounters Tab for problem based charting.  Patient discussed with Dr. Criselda Peaches

## 2021-02-06 ENCOUNTER — Ambulatory Visit (INDEPENDENT_AMBULATORY_CARE_PROVIDER_SITE_OTHER): Payer: Medicaid Other

## 2021-02-06 ENCOUNTER — Telehealth: Payer: Self-pay

## 2021-02-06 DIAGNOSIS — E1140 Type 2 diabetes mellitus with diabetic neuropathy, unspecified: Secondary | ICD-10-CM

## 2021-02-06 DIAGNOSIS — I10 Essential (primary) hypertension: Secondary | ICD-10-CM

## 2021-02-06 DIAGNOSIS — I428 Other cardiomyopathies: Secondary | ICD-10-CM

## 2021-02-06 DIAGNOSIS — IMO0002 Reserved for concepts with insufficient information to code with codable children: Secondary | ICD-10-CM

## 2021-02-06 DIAGNOSIS — I5042 Chronic combined systolic (congestive) and diastolic (congestive) heart failure: Secondary | ICD-10-CM

## 2021-02-06 LAB — URINALYSIS, ROUTINE W REFLEX MICROSCOPIC
Bilirubin, UA: NEGATIVE
Glucose, UA: NEGATIVE
Ketones, UA: NEGATIVE
Nitrite, UA: POSITIVE — AB
Protein,UA: NEGATIVE
RBC, UA: NEGATIVE
Specific Gravity, UA: 1.007 (ref 1.005–1.030)
Urobilinogen, Ur: 1 mg/dL (ref 0.2–1.0)
pH, UA: 7 (ref 5.0–7.5)

## 2021-02-06 LAB — URINE CYTOLOGY ANCILLARY ONLY
Bacterial Vaginitis (gardnerella): NEGATIVE
Candida Glabrata: NEGATIVE
Candida Vaginitis: NEGATIVE
Chlamydia: NEGATIVE
Comment: NEGATIVE
Comment: NEGATIVE
Comment: NEGATIVE
Comment: NEGATIVE
Comment: NEGATIVE
Comment: NORMAL
Neisseria Gonorrhea: NEGATIVE
Trichomonas: POSITIVE — AB

## 2021-02-06 LAB — MICROSCOPIC EXAMINATION: Casts: NONE SEEN /lpf

## 2021-02-06 NOTE — Telephone Encounter (Signed)
Patient came in on 02-05-21 for an appointment formed filled out for Diabetic Shoes. Formed filled out by Dr. Austin Miles I faxed form to [facility]-[phone] office number is [phone] Filomena Jungling C2/25/202211:22 AM

## 2021-02-06 NOTE — Telephone Encounter (Signed)
Received TC from pharmacist at [facility] pharmacy who states the Rx's they received yesterday came from MD Austin Miles) who does not have medicaid authorization.  Asking for:  Trulicity Amlodipine pantaprazole Spironolactone  Can RX's be resent by another MD.  Will forward to red team/attendings. Thank you, SChaplin, RN,BSN

## 2021-02-08 ENCOUNTER — Other Ambulatory Visit: Payer: Self-pay | Admitting: Internal Medicine

## 2021-02-08 DIAGNOSIS — IMO0002 Reserved for concepts with insufficient information to code with codable children: Secondary | ICD-10-CM

## 2021-02-08 DIAGNOSIS — E1140 Type 2 diabetes mellitus with diabetic neuropathy, unspecified: Secondary | ICD-10-CM

## 2021-02-08 MED ORDER — SPIRONOLACTONE 50 MG PO TABS: 50.0000 mg | ORAL_TABLET | Freq: Every day | ORAL | 3 refills | Status: DC

## 2021-02-08 MED ORDER — PANTOPRAZOLE SODIUM 40 MG PO TBEC: 40.0000 mg | DELAYED_RELEASE_TABLET | Freq: Every day | ORAL | 3 refills | Status: DC

## 2021-02-08 MED ORDER — TRULICITY 0.75 MG/0.5ML ~~LOC~~ SOAJ
0.7500 mg | SUBCUTANEOUS | 4 refills | Status: DC
Start: 2021-02-08 — End: 2021-09-24

## 2021-02-08 MED ORDER — AMLODIPINE BESYLATE 10 MG PO TABS: 10.0000 mg | ORAL_TABLET | Freq: Every day | ORAL | 3 refills | Status: DC

## 2021-02-08 NOTE — Telephone Encounter (Signed)
It appears this was not addressed on Friday. Signed and sent to [facility] pharmacy.

## 2021-02-08 NOTE — Progress Notes (Signed)
Opened in error

## 2021-02-09 ENCOUNTER — Other Ambulatory Visit: Payer: Self-pay | Admitting: Student

## 2021-02-09 ENCOUNTER — Telehealth: Payer: Self-pay | Admitting: *Deleted

## 2021-02-09 DIAGNOSIS — E1140 Type 2 diabetes mellitus with diabetic neuropathy, unspecified: Secondary | ICD-10-CM

## 2021-02-09 DIAGNOSIS — IMO0002 Reserved for concepts with insufficient information to code with codable children: Secondary | ICD-10-CM

## 2021-02-09 LAB — CUP PACEART REMOTE DEVICE CHECK
Battery Remaining Longevity: 1 mo — CL
Battery Voltage: 2.69 V
Brady Statistic AP VP Percent: 0.01 %
Brady Statistic AP VS Percent: 0.02 %
Brady Statistic AS VP Percent: 97.68 %
Brady Statistic AS VS Percent: 2.3 %
Brady Statistic RA Percent Paced: 0.03 %
Brady Statistic RV Percent Paced: 30.36 %
Date Time Interrogation Session: 20220226225125
HighPow Impedance: 46 Ohm
Implantable Lead Implant Date: 20140910
Implantable Lead Implant Date: 20140910
Implantable Lead Implant Date: 20140910
Implantable Lead Location: 753858
Implantable Lead Location: 753859
Implantable Lead Location: 753860
Implantable Lead Model: 4298
Implantable Lead Model: 5076
Implantable Lead Model: 6935
Implantable Pulse Generator Implant Date: 20140910
Lead Channel Impedance Value: 304 Ohm
Lead Channel Impedance Value: 304 Ohm
Lead Channel Impedance Value: 342 Ohm
Lead Channel Impedance Value: 342 Ohm
Lead Channel Impedance Value: 399 Ohm
Lead Channel Impedance Value: 399 Ohm
Lead Channel Impedance Value: 418 Ohm
Lead Channel Impedance Value: 456 Ohm
Lead Channel Impedance Value: 513 Ohm
Lead Channel Impedance Value: 532 Ohm
Lead Channel Impedance Value: 532 Ohm
Lead Channel Impedance Value: 532 Ohm
Lead Channel Impedance Value: 532 Ohm
Lead Channel Pacing Threshold Amplitude: 0.375 V
Lead Channel Pacing Threshold Amplitude: 0.5 V
Lead Channel Pacing Threshold Amplitude: 1 V
Lead Channel Pacing Threshold Pulse Width: 0.4 ms
Lead Channel Pacing Threshold Pulse Width: 0.4 ms
Lead Channel Pacing Threshold Pulse Width: 0.4 ms
Lead Channel Sensing Intrinsic Amplitude: 16.25 mV
Lead Channel Sensing Intrinsic Amplitude: 16.25 mV
Lead Channel Sensing Intrinsic Amplitude: 3.25 mV
Lead Channel Sensing Intrinsic Amplitude: 3.25 mV
Lead Channel Setting Pacing Amplitude: 2 V
Lead Channel Setting Pacing Amplitude: 2 V
Lead Channel Setting Pacing Amplitude: 2.5 V
Lead Channel Setting Pacing Pulse Width: 0.4 ms
Lead Channel Setting Pacing Pulse Width: 0.4 ms
Lead Channel Setting Sensing Sensitivity: 0.3 mV

## 2021-02-09 LAB — URINE CULTURE

## 2021-02-09 MED ORDER — AMOXICILLIN-POT CLAVULANATE 875-125 MG PO TABS: 1.0000 | ORAL_TABLET | Freq: Two times a day (BID) | ORAL | 0 refills | Status: DC

## 2021-02-09 MED ORDER — METRONIDAZOLE 500 MG PO TABS: 500.0000 mg | ORAL_TABLET | Freq: Two times a day (BID) | ORAL | 0 refills | Status: DC

## 2021-02-09 MED ORDER — PANTOPRAZOLE SODIUM 40 MG PO TBEC: 40.0000 mg | DELAYED_RELEASE_TABLET | Freq: Every day | ORAL | 3 refills | Status: DC

## 2021-02-09 NOTE — Progress Notes (Signed)
Spoke with patient via phone call. Discussed urine culture findings of streptococcus sanguinis and STI findings of trichomonas. Will prescribe 7 day course of augmentin for strep UTI and a 7 day course of flagyl for trichomonas. Patient agrees with plan and confirms understanding.

## 2021-02-09 NOTE — Telephone Encounter (Signed)
Received call from Alecia Lemming at [facility] stating Dr. Austin Miles is not registered with [facility]. Gave today's Attending Heide Spark) for Augmentin, flagyl, and Protonix.

## 2021-02-09 NOTE — Telephone Encounter (Addendum)
Information was sent to [employer] for PA for Trulicity 0.75 mg/0.5mg .  Approved 02/09/2021 thru 02/09/2022.  Angelina Ok, RN 02/09/2021 3:08 PM.

## 2021-02-11 ENCOUNTER — Other Ambulatory Visit: Payer: Self-pay

## 2021-02-11 ENCOUNTER — Encounter: Payer: Self-pay | Admitting: Internal Medicine

## 2021-02-11 ENCOUNTER — Ambulatory Visit (INDEPENDENT_AMBULATORY_CARE_PROVIDER_SITE_OTHER): Payer: Medicaid Other | Admitting: Internal Medicine

## 2021-02-11 VITALS — BP 132/84 | HR 100 | Ht 69.0 in | Wt 235.0 lb

## 2021-02-11 DIAGNOSIS — Z79899 Other long term (current) drug therapy: Secondary | ICD-10-CM

## 2021-02-11 DIAGNOSIS — I428 Other cardiomyopathies: Secondary | ICD-10-CM | POA: Diagnosis not present

## 2021-02-11 DIAGNOSIS — Z95810 Presence of automatic (implantable) cardiac defibrillator: Secondary | ICD-10-CM | POA: Diagnosis not present

## 2021-02-11 DIAGNOSIS — I5042 Chronic combined systolic (congestive) and diastolic (congestive) heart failure: Secondary | ICD-10-CM
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
## 2021-02-11 NOTE — Patient Instructions (Signed)
Medication Instructions:  ** Take your Lasix 40mg  - 1 tablet by mouth x 5 days then go to every other day.  *If you need a refill on your cardiac medications before your next appointment, please call your pharmacy*   Lab Work: CBC, BMET and DIG today If you have labs (blood work) drawn today and your tests are completely normal, you will receive your results only by: MyChart Message (if you have MyChart) OR . A paper copy in the mail If you have any lab test that is abnormal or we need to change your treatment, we will call you to review the results.   Testing/Procedures: Your physician has recommended that you have a defibrillator replaced. An implantable cardioverter defibrillator (ICD) is a small device that is placed in your chest or, in rare cases, your abdomen. This device uses electrical pulses or shocks to help control life-threatening, irregular heartbeats that could lead the heart to suddenly stop beating (sudden cardiac arrest). Leads are attached to the ICD that goes into your heart. This is done in the hospital and usually requires an overnight stay. Please see the instruction sheet given to you today for more information.   Follow-Up: At [facility], you and your health needs are our priority.  As part of our continuing mission to provide you with exceptional heart care, we have created designated Provider Care Teams.  These Care Teams include your primary Cardiologist (physician) and Advanced Practice Providers (APPs -  Physician Assistants and Nurse Practitioners) who all work together to provide you with the care you need, when you need it.  We recommend signing up for the patient portal called "MyChart".  Sign up information is provided on this After Visit Summary.  MyChart is used to connect with patients for Virtual Visits (Telemedicine).  Patients are able to view lab/test results, encounter notes, upcoming appointments, etc.  Non-urgent messages can be sent to your  provider as well.   To learn more about what you can do with MyChart, go to [facility].    Your next appointment:   To be scheduled

## 2021-02-12 LAB — BASIC METABOLIC PANEL
BUN/Creatinine Ratio: 6 — ABNORMAL LOW (ref 9–23)
BUN: 5 mg/dL — ABNORMAL LOW (ref 6–24)
CO2: 23 mmol/L (ref 20–29)
Calcium: 8.9 mg/dL (ref 8.7–10.2)
Chloride: 102 mmol/L (ref 96–106)
Creatinine, Ser: 0.79 mg/dL (ref 0.57–1.00)
Glucose: 127 mg/dL — ABNORMAL HIGH (ref 65–99)
Potassium: 3.4 mmol/L — ABNORMAL LOW (ref 3.5–5.2)
Sodium: 141 mmol/L (ref 134–144)
eGFR: 89 mL/min/{1.73_m2} (ref >59)

## 2021-02-12 LAB — CBC
Hematocrit: 31.3 % — ABNORMAL LOW (ref 34.0–46.6)
Hemoglobin: 10.2 g/dL — ABNORMAL LOW (ref 11.1–15.9)
MCH: 26.6 pg (ref 26.6–33.0)
MCHC: 32.6 g/dL (ref 31.5–35.7)
MCV: 82 fL (ref 79–97)
Platelets: 270 10*3/uL (ref 150–450)
RBC: 3.83 x10E6/uL (ref 3.77–5.28)
RDW: 13 % (ref 11.7–15.4)
WBC: 8.3 10*3/uL (ref 3.4–10.8)

## 2021-02-12 LAB — DIGOXIN LEVEL: Digoxin, Serum: <0.4 ng/mL — ABNORMAL LOW (ref 0.5–0.9)

## 2021-02-13 ENCOUNTER — Telehealth: Payer: Self-pay

## 2021-02-13 MED ORDER — FUROSEMIDE 40 MG PO TABS: ORAL_TABLET | ORAL | 3 refills | Status: DC

## 2021-02-13 NOTE — Progress Notes (Signed)
Internal Medicine Clinic Attending  Case discussed with Dr. Jinwala  At the time of the visit.  We reviewed the resident's history and exam and pertinent patient test results.  I agree with the assessment, diagnosis, and plan of care documented in the resident's note.  

## 2021-02-13 NOTE — Progress Notes (Signed)
Remote ICD transmission.   

## 2021-02-13 NOTE — Telephone Encounter (Signed)
-----   Message from Alois Cliche, RN sent at 02/11/2021  4:02 PM EST ----- Regarding: follow up x 1 week Good Afternoon,  Dr Graciela Husbands would like for you to follow this pt.  Is there a referral that I need to put in for you to officially follow?  Thank You,  Mindi Junker

## 2021-02-13 NOTE — Telephone Encounter (Signed)
ICM referral call to patient.  Explained reason for call and she agreed to monthly ICM follow up.  Discussed Furosemide instructions given to her at 02/11/2021 office visit with Dr Graciela Husbands.  She verbalized understanding of the order but only had 2 Furosemide tablets.  She needs a new prescription for Dr Odessa Fleming recommendation.  Confirmed preferred pharmacy and advised will send script in today.  Provided ICM number and encouraged to call if needed.  She will send a manual remote transmission on 3/8 after following Dr Odessa Fleming instructions for taking Furosemide 40 mg 1 tablet x 5 days and after take every other day.

## 2021-02-17 ENCOUNTER — Telehealth: Payer: Self-pay

## 2021-02-17 ENCOUNTER — Ambulatory Visit (INDEPENDENT_AMBULATORY_CARE_PROVIDER_SITE_OTHER): Payer: Medicaid Other

## 2021-02-17 DIAGNOSIS — I5042 Chronic combined systolic (congestive) and diastolic (congestive) heart failure: Secondary | ICD-10-CM

## 2021-02-17 DIAGNOSIS — Z95810 Presence of automatic (implantable) cardiac defibrillator: Secondary | ICD-10-CM

## 2021-02-17 NOTE — Telephone Encounter (Signed)
ICM call to patient and requested to send manual remote transmission to review fluid levels as discussed on 3/4.  She stated she will send one later today.

## 2021-02-17 NOTE — Progress Notes (Signed)
EPIC Encounter for ICM Monitoring  Patient Name: [name] is a 55 y.o. female Date: 02/17/2021 Primary Care Physican: Merrilyn Puma, MD Primary Cardiologist: HF Clinic Electrophysiologist: Joycelyn Schmid Pacing: 97.8%  02/11/2021 Weight: 235 lbs   Battery ERI reached on 01/13/2021 and replacement scheduled for 03/18/2021       1st ICM remote transmission.  Heart Failure questions reviewed.  Pt symptomatic with shortness of breath and leg swelling as indicated at 3/2 OV with Dr Graciela Husbands.  Pt instructed on 3/2 to take Furosemide x 5 consecutive days and then return to every other day.  Patient did not start the 5 day dosage until yesterday, 3/7 so thoracic impedance on 3/8 remote transmission looks the same.     Optivol thoracic impedance suggesting possible fluid accumulation starting 12/02/2020.   Prescribed:  Furosemide 40 mg take 1 tablet every other day.  Spironolactone 50 mg take 1 tablet daily.  Recommendations: Pt started 5 day course of Furosemide 1 tablet daily x 5 days on 02/16/2021.  Follow-up plan: ICM clinic phone appointment on 02/20/2021 to recheck fluid levels.   91 day device clinic remote transmission 05/08/2021.    EP/Cardiology Office Visits: 03/02/2021 with HF clinic PA/NP.  03/31/2020 with Dr Graciela Husbands  Copy of ICM check sent to Dr. Graciela Husbands.   3 month ICM trend: 02/17/2021.    1 Year ICM trend:       [name], RN 02/17/2021 4:03 PM

## 2021-02-19 ENCOUNTER — Telehealth: Payer: Self-pay

## 2021-02-19 NOTE — Telephone Encounter (Signed)
Spoke with pt and advised per Dr Graciela Husbands labs are normal with exception of anemia which is new for pt.  Pt advised she should follow up with her PCP.  K+ is also mildly low and Dr Graciela Husbands recommends K+ rich foods.  Reviewed potassium rich foods with pt.  Pt verbalizes understanding and thanked Charity fundraiser for the call.

## 2021-02-20 NOTE — Progress Notes (Signed)
No ICM remote transmission received for 02/20/2021 and next ICM transmission scheduled for 02/25/2021.

## 2021-02-24 ENCOUNTER — Other Ambulatory Visit: Payer: Self-pay

## 2021-02-24 DIAGNOSIS — M5416 Radiculopathy, lumbar region: Secondary | ICD-10-CM

## 2021-02-24 NOTE — Telephone Encounter (Addendum)
Returned call to patient. States she was given an appt at the [facility] for fitting for brace for DM shoes and then today was told they didn't receive the faxed Rx. Rx is in Epic under Other Orders tab. Spoke with Marcelo Baldy at the [facility] States they were not aware patient needed brace. Per OV note on 02/05/2021 that were faxed to them, provider states need for brace. Marcelo Baldy is requesting order for Left AFO with ICD-10 and Provider's NPI. Order is for left leg brace. AFO has been added along with Provider's NPI. This has been faxed, along with 02/05/2021 OV notes to [facility] at [phone]. Fax confirmation receipt received. Patient notified. Also scheduled tele appt tomorrow to f/u from OV on 02/05/2021. Patient would also like to discuss pain med.

## 2021-02-24 NOTE — Telephone Encounter (Signed)
  gabapentin (NEURONTIN) 300 MG capsule, and   cyclobenzaprine (FLEXERIL) 5 MG tablet to be filled @   [facility] [address], [place] - [address] Phone:  [phone]  Fax:  [phone]

## 2021-02-24 NOTE — Telephone Encounter (Signed)
Pt is requesting a call back ..... I did get something about where  her medication  Was before the call dropped

## 2021-02-25 ENCOUNTER — Other Ambulatory Visit: Payer: Self-pay

## 2021-02-25 ENCOUNTER — Ambulatory Visit (INDEPENDENT_AMBULATORY_CARE_PROVIDER_SITE_OTHER): Payer: Medicaid Other | Admitting: Internal Medicine

## 2021-02-25 ENCOUNTER — Encounter: Payer: Self-pay | Admitting: Internal Medicine

## 2021-02-25 DIAGNOSIS — B372 Candidiasis of skin and nail: Secondary | ICD-10-CM

## 2021-02-25 DIAGNOSIS — E1142 Type 2 diabetes mellitus with diabetic polyneuropathy: Secondary | ICD-10-CM | POA: Diagnosis not present

## 2021-02-25 DIAGNOSIS — I69354 Hemiplegia and hemiparesis following cerebral infarction affecting left non-dominant side: Secondary | ICD-10-CM

## 2021-02-25 DIAGNOSIS — G4733 Obstructive sleep apnea (adult) (pediatric): Secondary | ICD-10-CM

## 2021-02-25 DIAGNOSIS — G40209 Localization-related (focal) (partial) symptomatic epilepsy and epileptic syndromes with complex partial seizures, not intractable, without status epilepticus: Secondary | ICD-10-CM | POA: Diagnosis not present

## 2021-02-25 DIAGNOSIS — F331 Major depressive disorder, recurrent, moderate: Secondary | ICD-10-CM | POA: Diagnosis not present

## 2021-02-25 DIAGNOSIS — Z79891 Long term (current) use of opiate analgesic: Secondary | ICD-10-CM

## 2021-02-25 DIAGNOSIS — I69359 Hemiplegia and hemiparesis following cerebral infarction affecting unspecified side: Secondary | ICD-10-CM

## 2021-02-25 MED ORDER — CYCLOBENZAPRINE HCL 5 MG PO TABS: 5.0000 mg | ORAL_TABLET | Freq: Three times a day (TID) | ORAL | 0 refills | Status: DC | PRN

## 2021-02-25 MED ORDER — FLUTICASONE PROPIONATE 0.05 % EX CREA
TOPICAL_CREAM | Freq: Two times a day (BID) | CUTANEOUS | 6 refills | Status: DC
Start: 2021-02-25 — End: 2021-10-01

## 2021-02-25 MED ORDER — GABAPENTIN 300 MG PO CAPS: 300.0000 mg | ORAL_CAPSULE | Freq: Three times a day (TID) | ORAL | 5 refills | Status: DC

## 2021-02-25 MED ORDER — NYSTATIN 100000 UNIT/GM EX CREA: 1.0000 "application " | TOPICAL_CREAM | Freq: Two times a day (BID) | CUTANEOUS | 0 refills | Status: DC

## 2021-02-25 MED ORDER — LEVETIRACETAM 500 MG PO TABS: 500.0000 mg | ORAL_TABLET | Freq: Two times a day (BID) | ORAL | 3 refills | Status: DC

## 2021-02-25 NOTE — Assessment & Plan Note (Addendum)
Mentions hx of chronic neuropathy with difficulty with inspection and foot care due to hemiparesis. Mentions currently not seeing podiatry after moving to [place].  - Gabapentin refilled - Podiatry referral

## 2021-02-25 NOTE — Assessment & Plan Note (Signed)
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 Currently on nystatin powder and fluticasone cream for treatment with relief. Requesting refills this visit.
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
## 2021-02-25 NOTE — Progress Notes (Signed)
Internal Medicine Clinic Attending  Case discussed with Dr. Lee at the time of the visit.  We reviewed the resident's history and exam and pertinent patient test results.  I agree with the assessment, diagnosis, and plan of care documented in the resident's note.  Alexander Raines, M.D., Ph.D.  

## 2021-02-25 NOTE — Assessment & Plan Note (Signed)
Pt requires refills on medications with associated diagnosis above.  Reviewed disease process and find this medication to be necessary, will not change dose or alter current therapy. 
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
## 2021-02-25 NOTE — Assessment & Plan Note (Signed)
>>  ASSESSMENT AND PLAN FOR DIABETIC PERIPHERAL NEUROPATHY (HCC) WRITTEN ON 02/25/2021  3:00 PM BY LEE, JOSHUA K, MD  Mentions hx of chronic neuropathy with difficulty with inspection and foot care due to hemiparesis. Mentions currently not seeing podiatry after moving to [place].  - Gabapentin refilled - Podiatry referral

## 2021-02-25 NOTE — Assessment & Plan Note (Signed)
Hx of chronic opioid therapy for pain related to prior CVA and DJD. Previously followed with [facility] in [place]. She mentions that she has had difficulty with f/u with the pain clinic after moving to her new home in [place] and is requesting new referral to establish care with a local pain clinic. She mentions expressing this to her previous pain clinic who referred her to a specialist in [facility] but they require a new referral. She was previously on percocet 10-325mg  TID prn but is currently not using due to lack of refills. Outside withdrawal windows and denies current withdrawal sxs although endorsing pain.  -PDMP reviewed, confirming discontinuation of Percocet in 2021 - Will make new referral - Meanwhile can treat with supportive care and alternative therapies including gabapentin, flexeril

## 2021-02-25 NOTE — Assessment & Plan Note (Addendum)
Has hx of hemiparesis on L due to CVA 7 years prior. Referred to physical therapy on last visit. Requesting refills on flexeril for recurrent episodes of spasticity.  - Refills sent

## 2021-02-26 ENCOUNTER — Telehealth: Payer: Self-pay

## 2021-02-26 NOTE — Telephone Encounter (Signed)
I spoke with the patient and she agreed to send the transmission today for missed HF transmission.

## 2021-02-27 ENCOUNTER — Encounter: Payer: Self-pay | Admitting: Student

## 2021-02-27 NOTE — Progress Notes (Signed)
No ICM remote transmission received for 02/25/2021 and next ICM transmission scheduled for 03/09/2021.

## 2021-03-02 ENCOUNTER — Encounter (HOSPITAL_COMMUNITY): Payer: Medicaid Other

## 2021-03-03 ENCOUNTER — Telehealth (HOSPITAL_COMMUNITY): Payer: Self-pay | Admitting: Licensed Clinical Social Worker

## 2021-03-03 NOTE — Telephone Encounter (Signed)
CSW consulted to reach out to pt to assess for social barriers to attending clinic appts as pt has had two recent no-shows.  CSW attempted to call pt but phone went straight to VM- left VM requesting return call  Will continue to attempt outreach to discuss no-shows.  Burna Sis, LCSW Clinical Social Worker Advanced Heart Failure Clinic Desk#: [phone] Cell#: [phone]

## 2021-03-04 ENCOUNTER — Telehealth (HOSPITAL_COMMUNITY): Payer: Self-pay | Admitting: Licensed Clinical Social Worker

## 2021-03-04 ENCOUNTER — Telehealth (HOSPITAL_COMMUNITY): Payer: Self-pay | Admitting: Adult Health

## 2021-03-04 NOTE — Telephone Encounter (Signed)
CSW called pt to discuss pts 2 recent no shows for Advanced Heart Failure clinic appts.  Pt reports she had forgotten about the appts and that was the only reason she didn't come.  States she is unable to write down appt details so when she gets the reminder phone call it doesn't help- does report vaguely remembering getting the text reminder but wasn't sure.  Pt states that she would just appreciate any help remembering appts in the future including calls day of and being sent a written reminder in the mail.  Pt reiterates she has no transportation barriers or other issues to making it to appts.  CSW sent message to clinic scheduler to request help getting pt new appt- CSW will follow to assist with reminding pt of new appt once scheduled.  Will continue to follow and assist as needed  Burna Sis, LCSW Clinical Social Worker [facility] Desk#: [phone] Cell#: [phone]

## 2021-03-05 ENCOUNTER — Ambulatory Visit (INDEPENDENT_AMBULATORY_CARE_PROVIDER_SITE_OTHER): Payer: Medicaid Other | Admitting: Podiatry

## 2021-03-05 ENCOUNTER — Other Ambulatory Visit: Payer: Self-pay

## 2021-03-05 ENCOUNTER — Encounter: Payer: Self-pay | Admitting: Podiatry

## 2021-03-05 DIAGNOSIS — G629 Polyneuropathy, unspecified: Secondary | ICD-10-CM | POA: Diagnosis not present

## 2021-03-05 DIAGNOSIS — M21372 Foot drop, left foot: Secondary | ICD-10-CM

## 2021-03-05 DIAGNOSIS — M779 Enthesopathy, unspecified: Secondary | ICD-10-CM

## 2021-03-06 ENCOUNTER — Other Ambulatory Visit: Payer: Self-pay | Admitting: Internal Medicine

## 2021-03-06 DIAGNOSIS — I4891 Unspecified atrial fibrillation: Secondary | ICD-10-CM

## 2021-03-08 NOTE — Progress Notes (Signed)
Subjective:   Patient ID: [name], female   DOB: 55 y.o.   MRN: [id number]   HPI Patient states she had a stroke around 7 years ago and she gets weird sensations in her foot and leg she is wearing a brace and she is unable to walk the way she would like to.  States that she seems to be ordering a new brace but she is not well oriented so is hard for me to get complete answer and patient does not smoke and is not active   Review of Systems  All other systems reviewed and are negative.       Objective:  Physical Exam Vitals and nursing note reviewed.  Constitutional:      Appearance: She is well-developed.  Pulmonary:     Effort: Pulmonary effort is normal.  Musculoskeletal:        General: Normal range of motion.  Skin:    General: Skin is warm.  Neurological:     Mental Status: She is alert.     Neurovascular status diminished some on the left side with diminished muscle strength noted and edema of the foot and ankle.  It is localized and I did not note any leg edema or negative Denna Haggard' sign was noted.  Patient does have a brace and shoes and apparently is having another brace made and does not do physical therapy currently     Assessment:  Patient in relatively poor health with history of left-sided stroke with reduced muscle strength and neurological symptoms with foot drop     Plan:  H&P discussed all these different issues with her and reviewed with her that this is a difficult problem for [place] to make a difference for but I think it is important she is in a brace.  I did discuss that we could make her a brace if necessary and she again does not give me a good indication as to whether or not this would be of benefit.  Patient is encouraged to call me if she cannot get a brace and we will arrange for this to happen for her

## 2021-03-09 ENCOUNTER — Ambulatory Visit (INDEPENDENT_AMBULATORY_CARE_PROVIDER_SITE_OTHER): Payer: Medicaid Other

## 2021-03-09 DIAGNOSIS — Z95810 Presence of automatic (implantable) cardiac defibrillator: Secondary | ICD-10-CM

## 2021-03-09 DIAGNOSIS — I5042 Chronic combined systolic (congestive) and diastolic (congestive) heart failure: Secondary | ICD-10-CM

## 2021-03-10 ENCOUNTER — Telehealth (HOSPITAL_COMMUNITY): Payer: Self-pay | Admitting: Licensed Clinical Social Worker

## 2021-03-10 NOTE — Telephone Encounter (Signed)
CSW called pt again to check in regarding making a follow up appt- pt is agreeable and CSW transferred pt to scheduler who was able to get pt appt on 5/3  CSW printed schedule of pt upcoming appts and mailed to pt as she said she needs a written reminder of her appts to help her not forget.  Also placed appt note for pt to get reminder call day of and CSW has scheduled to call her the day before the appt to confirm she remembers.  Will continue to follow and assist as needed  Burna Sis, LCSW Clinical Social Worker [facility] Desk#: [phone] Cell#: [phone]

## 2021-03-11 ENCOUNTER — Telehealth: Payer: Self-pay

## 2021-03-11 NOTE — Progress Notes (Signed)
EPIC Encounter for ICM Monitoring  Patient Name: [name] is a 55 y.o. female Date: 03/11/2021 Primary Care Physican: Merrilyn Puma, MD Primary Cardiologist: HF Clinic Electrophysiologist: Joycelyn Schmid Pacing: 97.8%         02/11/2021 Weight: 235 lbs   Battery ERI reached on 01/13/2021 and replacement scheduled for 03/18/2021                                                           Attempted call to patient and unable to reach. Transmission reviewed.    Optivol thoracic impedance suggesting fluid levels returned close to baseline.   Prescribed:  Furosemide 40 mg take 1 tablet every other day.  Spironolactone 50 mg take 1 tablet daily.  Recommendations:  Unable to reach.    Follow-up plan: ICM clinic phone appointment on 05/12/2021 6 weeks post battery replacement.   91 day device clinic remote transmission 05/08/2021.    EP/Cardiology Office Visits: 04/14/2021 with HF clinic PA/NP.  03/31/2020 with Dr Graciela Husbands  Copy of ICM check sent to Dr. Graciela Husbands.   3 month ICM trend: 03/09/2021.    1 Year ICM trend:       [name], RN 03/11/2021 3:14 PM

## 2021-03-11 NOTE — Telephone Encounter (Signed)
Remote ICM transmission received.  Attempted call to patient regarding ICM remote transmission and line busy.  

## 2021-03-16 ENCOUNTER — Other Ambulatory Visit (HOSPITAL_COMMUNITY)
Admission: RE | Admit: 2021-03-16 | Discharge: 2021-03-16 | Disposition: A | Discharge status: Home / Self Care | Payer: Medicaid Other | Source: Ambulatory Visit | Attending: Internal Medicine | Admitting: Internal Medicine

## 2021-03-16 ENCOUNTER — Other Ambulatory Visit: Payer: Medicaid Other | Admitting: *Deleted

## 2021-03-16 ENCOUNTER — Other Ambulatory Visit: Payer: Self-pay

## 2021-03-16 DIAGNOSIS — Z20822 Contact with and (suspected) exposure to covid-19: Secondary | ICD-10-CM | POA: Diagnosis not present

## 2021-03-16 DIAGNOSIS — Z95810 Presence of automatic (implantable) cardiac defibrillator: Secondary | ICD-10-CM

## 2021-03-16 DIAGNOSIS — Z01812 Encounter for preprocedural laboratory examination: Secondary | ICD-10-CM

## 2021-03-16 LAB — BASIC METABOLIC PANEL
BUN/Creatinine Ratio: 13 (ref 9–23)
BUN: 10 mg/dL (ref 6–24)
CO2: 26 mmol/L (ref 20–29)
Calcium: 10 mg/dL (ref 8.7–10.2)
Chloride: 100 mmol/L (ref 96–106)
Creatinine, Ser: 0.75 mg/dL (ref 0.57–1.00)
Glucose: 121 mg/dL — ABNORMAL HIGH (ref 65–99)
Potassium: 4.2 mmol/L (ref 3.5–5.2)
Sodium: 136 mmol/L (ref 134–144)
eGFR: 95 mL/min/{1.73_m2} (ref >59)

## 2021-03-16 LAB — CBC
Hematocrit: 36.8 % (ref 34.0–46.6)
Hemoglobin: 12.3 g/dL (ref 11.1–15.9)
MCH: 27 pg (ref 26.6–33.0)
MCHC: 33.4 g/dL (ref 31.5–35.7)
MCV: 81 fL (ref 79–97)
Platelets: 233 10*3/uL (ref 150–450)
RBC: 4.55 x10E6/uL (ref 3.77–5.28)
RDW: 13.2 % (ref 11.7–15.4)
WBC: 8 10*3/uL (ref 3.4–10.8)

## 2021-03-16 LAB — SARS CORONAVIRUS 2 (TAT 6-24 HRS): SARS Coronavirus 2: NEGATIVE

## 2021-03-16 NOTE — Progress Notes (Signed)
CBC and BMET pre-procedure lab exam for 03/18/2021 per Dr Graciela Husbands.

## 2021-03-18 ENCOUNTER — Other Ambulatory Visit: Payer: Self-pay

## 2021-03-18 ENCOUNTER — Ambulatory Visit (HOSPITAL_COMMUNITY)
Admission: RE | Disposition: A | Discharge status: Home / Self Care | Payer: Medicaid Other | Source: Home / Self Care | Attending: Internal Medicine

## 2021-03-18 ENCOUNTER — Ambulatory Visit (HOSPITAL_COMMUNITY)
Admission: RE | Admit: 2021-03-18 | Discharge: 2021-03-18 | Disposition: A | Discharge status: Home / Self Care | Payer: Medicaid Other | Attending: Internal Medicine | Admitting: Internal Medicine

## 2021-03-18 DIAGNOSIS — Z888 Allergy status to other drugs, medicaments and biological substances status: Secondary | ICD-10-CM | POA: Insufficient documentation

## 2021-03-18 DIAGNOSIS — I447 Left bundle-branch block, unspecified: Secondary | ICD-10-CM | POA: Diagnosis not present

## 2021-03-18 DIAGNOSIS — I5042 Chronic combined systolic (congestive) and diastolic (congestive) heart failure: Secondary | ICD-10-CM | POA: Diagnosis not present

## 2021-03-18 DIAGNOSIS — Z4502 Encounter for adjustment and management of automatic implantable cardiac defibrillator: Secondary | ICD-10-CM | POA: Diagnosis not present

## 2021-03-18 DIAGNOSIS — I4891 Unspecified atrial fibrillation: Secondary | ICD-10-CM | POA: Diagnosis not present

## 2021-03-18 DIAGNOSIS — Z8249 Family history of ischemic heart disease and other diseases of the circulatory system: Secondary | ICD-10-CM | POA: Insufficient documentation

## 2021-03-18 DIAGNOSIS — Z794 Long term (current) use of insulin: Secondary | ICD-10-CM | POA: Diagnosis not present

## 2021-03-18 DIAGNOSIS — Z79899 Other long term (current) drug therapy: Secondary | ICD-10-CM | POA: Diagnosis not present

## 2021-03-18 DIAGNOSIS — Z881 Allergy status to other antibiotic agents status: Secondary | ICD-10-CM | POA: Insufficient documentation

## 2021-03-18 DIAGNOSIS — I69354 Hemiplegia and hemiparesis following cerebral infarction affecting left non-dominant side: Secondary | ICD-10-CM | POA: Diagnosis not present

## 2021-03-18 DIAGNOSIS — Z7901 Long term (current) use of anticoagulants: Secondary | ICD-10-CM | POA: Diagnosis not present

## 2021-03-18 DIAGNOSIS — I110 Hypertensive heart disease with heart failure: Secondary | ICD-10-CM | POA: Diagnosis not present

## 2021-03-18 DIAGNOSIS — I428 Other cardiomyopathies: Secondary | ICD-10-CM | POA: Insufficient documentation

## 2021-03-18 DIAGNOSIS — E119 Type 2 diabetes mellitus without complications: Secondary | ICD-10-CM | POA: Insufficient documentation

## 2021-03-18 HISTORY — PX: BIV ICD GENERATOR CHANGEOUT: EP1194

## 2021-03-18 LAB — GLUCOSE, CAPILLARY: Glucose-Capillary: 178 mg/dL — ABNORMAL HIGH (ref 70–99)

## 2021-03-18 SURGERY — BIV ICD GENERATOR CHANGEOUT

## 2021-03-18 MED ORDER — POVIDONE-IODINE 10 % EX SWAB
2.0000 "application " | Freq: Once | CUTANEOUS | Status: AC
  Administered 2021-03-18: 2 via TOPICAL

## 2021-03-18 MED ORDER — SODIUM CHLORIDE 0.9 % IV SOLN
80.0000 mg | INTRAVENOUS | Status: AC
  Administered 2021-03-18: 80 mg
  Filled 2021-03-18: qty 2

## 2021-03-18 MED ORDER — LIDOCAINE HCL 1 % IJ SOLN
INTRAMUSCULAR | Status: AC
  Filled 2021-03-18: qty 20

## 2021-03-18 MED ORDER — SODIUM CHLORIDE 0.9 % IV SOLN: INTRAVENOUS | Status: AC

## 2021-03-18 MED ORDER — CEFAZOLIN SODIUM-DEXTROSE 2-4 GM/100ML-% IV SOLN
INTRAVENOUS | Status: AC
  Filled 2021-03-18: qty 100

## 2021-03-18 MED ORDER — CEFAZOLIN SODIUM-DEXTROSE 2-4 GM/100ML-% IV SOLN
2.0000 g | INTRAVENOUS | Status: AC
  Administered 2021-03-18: 2 g via INTRAVENOUS

## 2021-03-18 MED ORDER — SODIUM CHLORIDE 0.9 % IV SOLN
INTRAVENOUS | Status: AC
  Filled 2021-03-18: qty 2

## 2021-03-18 MED ORDER — LIDOCAINE HCL (PF) 1 % IJ SOLN
INTRAMUSCULAR | Status: DC | PRN
  Administered 2021-03-18: 60 mL via SUBCUTANEOUS

## 2021-03-18 MED ORDER — ACETAMINOPHEN 325 MG PO TABS
325.0000 mg | ORAL_TABLET | ORAL | Status: DC | PRN
  Filled 2021-03-18: qty 2

## 2021-03-18 MED ORDER — CHLORHEXIDINE GLUCONATE 4 % EX LIQD: 4.0000 "application " | Freq: Once | CUTANEOUS | Status: DC

## 2021-03-18 MED ORDER — SODIUM CHLORIDE 0.9 % IV SOLN: INTRAVENOUS | Status: DC

## 2021-03-18 MED ORDER — LIDOCAINE HCL 1 % IJ SOLN
INTRAMUSCULAR | Status: AC
  Filled 2021-03-18: qty 40

## 2021-03-18 SURGICAL SUPPLY — 6 items
CABLE SURGICAL S-101-97-12 (CABLE) ×2 IMPLANT
HEMOSTAT SURGICEL 2X4 FIBR (HEMOSTASIS) ×2 IMPLANT
ICD CLARIA MRI DTMA1QQ (ICD Generator) ×2 IMPLANT
MAT PREVALON FULL STRYKER (MISCELLANEOUS) ×2 IMPLANT
PAD PRO RADIOLUCENT 2001M-C (PAD) ×2 IMPLANT
TRAY PACEMAKER INSERTION (PACKS) ×2 IMPLANT

## 2021-03-18 NOTE — Discharge Instructions (Signed)

## 2021-03-18 NOTE — H&P (Signed)
Patient Care Team: Merrilyn Puma, MD as PCP - General   HPI  [name] is a 55 y.o. female CRT-D-Medtronic implanted 2014 for nonischemic cardiomyopathy left bundle branch block with subsequent improvement in LV function device @ ERI .  Interval stroke with left hemiparesis with some ambulation No chest pain or edema or shortness of breath    .  DATE TEST EF   2012 LHC  Cors normal  9/14 Echo   25-30 %   9/18 Echo   45 %         Date Cr K Hgb  9/20 0.69 3.8 11.7 (1/20)    3/22 0.75 4.2 12.3   SCAF detected 11/17 and with a prior stroke anticoagulated with Xarelto  Thromboembolic risk factors (, HTN-1, TIA/CVA-2, DM-1, CHF-1, Gender-1) for a CHADSVASc Score of    Records and Results Reviewed*   Past Medical History:  Diagnosis Date  . Allergic rhinitis   . Arthritis    "hips, back, legs, arms" (07/04/2014)  . Asthma    hx  . Automatic implantable cardioverter-defibrillator in situ   . Calcifying tendinitis of shoulder   . Chronic combined systolic and diastolic CHF (congestive heart failure) (HCC)    EF 40-45% by echo 12/06/2012  . Chronic diastolic heart failure (HCC)     Primarily diastolic CHF: Likely due to uncontrolled HTN. Last echo (8/12) with EF 45-50%, mild to moderate LVH with some asymmetric septal hypertrophy, RV normal size and systolic function. EF 50-55% by LV-gram in 6/12.   . Chronic lower back pain    secondary to DJD, obsetiy, hip problems. Followed by Dr. Ivory Broad (pain management)  . Coronary artery disease    questionable. LHC 05/2011 showing normal coronaries // Followed at [facility], Dr. Shirlee Latch  . Degeneration of lumbar or lumbosacral intervertebral disc   . DJD (degenerative joint disease) of hip    right sided  . Frequent UTI   . GERD (gastroesophageal reflux disease)   . HLD (hyperlipidemia)   . Hypertension    Poorly controlled. Has had HTN since age 55. Angioedema with ACEI.  24 Hr urine and  renal arterial dopplers ordered . . . Never done  . LBBB (left bundle branch block)   . Liver disease   . Morbid obesity (HCC)   . NICM (nonischemic cardiomyopathy) (HCC)    EF 45-50% in 8/12, cath 6/12 showed normal coronaries, EF 50-55% by LV gram  . OSA on CPAP    sleep study in 8/12 showed moderate to severe OSA requiring CPAP  . Polyneuropathy in diabetes(357.2)   . Presence of permanent cardiac pacemaker   . Seizures (HCC)    last 3 months  . Shortness of breath    none now  . Stroke [facility]) 12/2013   "my left side is paralyzed" (07/04/2014)  . Thoracic or lumbosacral neuritis or radiculitis, unspecified   . Type II diabetes mellitus (HCC) DX: 2002    Past Surgical History:  Procedure Laterality Date  . BI-VENTRICULAR IMPLANTABLE CARDIOVERTER DEFIBRILLATOR N/A 08/22/2013   Procedure: BI-VENTRICULAR IMPLANTABLE CARDIOVERTER DEFIBRILLATOR  (CRT-D);  Surgeon: [name], MD;  Location: [facility] CATH LAB;  Service: Cardiovascular;  Laterality: N/A;  . BI-VENTRICULAR IMPLANTABLE CARDIOVERTER DEFIBRILLATOR  (CRT-D)  08/2013   [name] 08/23/2013  . BREAST SURGERY Bilateral 2011   patient reports benign results  . CARDIAC CATHETERIZATION  05/2011  . CARPAL TUNNEL RELEASE Left    denies  . HEMIARTHROPLASTY SHOULDER FRACTURE  Right 1980's   denies  . MULTIPLE EXTRACTIONS WITH ALVEOLOPLASTY Bilateral 05/20/2017   Procedure: MULTIPLE EXTRACTION;  Surgeon: [name], DDS;  Location: [facility] OR;  Service: Oral Surgery;  Laterality: Bilateral;  . MULTIPLE TOOTH EXTRACTIONS  ~ 2011   tumors removed ; "my whole top"  . SHOULDER ARTHROSCOPY Right 12/26/2015   Procedure: Right Shoulder Arthroscopy, Debridement, and Decompression;  Surgeon: [name], MD;  Location: [facility] OR;  Service: Orthopedics;  Laterality: Right;  . TEE WITHOUT CARDIOVERSION N/A 01/14/2014   Procedure: TRANSESOPHAGEAL ECHOCARDIOGRAM (TEE);  Surgeon: Thurmon Fair, MD;  Location: [facility] ENDOSCOPY;  Service: Cardiovascular;  Laterality:  N/A;  . TUBAL LIGATION  05/31/1985    Current Facility-Administered Medications  Medication Dose Route Frequency Provider Last Rate Last Admin  . 0.9 %  sodium chloride infusion   Intravenous Continuous [name], MD 50 mL/hr at 03/18/21 0647 New Bag at 03/18/21 0647  . ceFAZolin (ANCEF) IVPB 2g/100 mL premix  2 g Intravenous On Call [name], MD      . chlorhexidine (HIBICLENS) 4 % liquid 4 application  4 application Topical Once [name], MD      . gentamicin (GARAMYCIN) 80 mg in sodium chloride 0.9 % 500 mL irrigation  80 mg Irrigation On Call [name], MD        Allergies  Allergen Reactions  . Ace Inhibitors Anaphylaxis    Swelling of the tongue and throat   . Clindamycin Anaphylaxis and Swelling  . Enalapril Maleate Anaphylaxis    Swelling of the tongue and throat   . Lisinopril Anaphylaxis    Swelling of the tongue and throat       Social History   Tobacco Use  . Smoking status: Never Smoker  . Smokeless tobacco: Never Used  Vaping Use  . Vaping Use: Never used  Substance Use Topics  . Alcohol use: No    Alcohol/week: 0.0 standard drinks  . Drug use: No     Family History  Problem Relation Age of Onset  . Heart disease Mother 90       Died of MI at age 64 yo  . Kidney disease Mother        requiring dialysis  . Congestive Heart Failure Mother   . Heart disease Father 40       MI age 55yo requiring stenting  . Diabetes Father   . Glaucoma Father   . Heart disease Paternal Grandmother        requiring pacemaker.  [name] Heart disease Paternal Grandfather 58       Died of MI at possibly age 10-53yo  . Stroke Paternal Grandfather   . Diabetes Brother   . Heart disease Brother 22       MI at age 17 years old  . Breast cancer Paternal Aunt   . Breast cancer Maternal Grandmother      Current Meds  Medication Sig  . amLODipine (NORVASC) 10 MG tablet Take 1 tablet (10 mg total) by mouth daily.  [name] atorvastatin (LIPITOR) 40 MG tablet  TAKE ONE TABLET BY MOUTH EVERY DAY (Patient taking differently: Take 40 mg by mouth daily.)  . cyanocobalamin 1000 MCG tablet Take 1 tablet by mouth daily.  . cyclobenzaprine (FLEXERIL) 5 MG tablet Take 1 tablet (5 mg total) by mouth 3 (three) times daily as needed for muscle spasms.  . Dulaglutide (TRULICITY) 0.75 MG/0.5ML SOPN Inject 0.75 mg into the skin once a week. (Patient taking  differently: Inject 0.75 mg into the skin every Thursday.)  . fluticasone (CUTIVATE) 0.05 % cream Apply topically 2 (two) times daily. Dispense two tubes (Patient taking differently: Apply 1 application topically 2 (two) times daily as needed (rash).)  . folic acid (FOLVITE) 1 MG tablet TAKE ONE TABLET BY MOUTH DAILY (Patient taking differently: Take 1 mg by mouth daily.)  . furosemide (LASIX) 40 MG tablet Take 1 tablet (40 mg total) daily for 5 (five) days.  After 5 days then take 1 tablet (40 mg total) every other day. (Patient taking differently: Take 40 mg by mouth every other day.)  . gabapentin (NEURONTIN) 300 MG capsule Take 1 capsule (300 mg total) by mouth 3 (three) times daily. (Patient taking differently: Take 300 mg by mouth 2 (two) times daily.)  . isosorbide mononitrate (IMDUR) 60 MG 24 hr tablet Take 1.5 tablets (90 mg total) by mouth daily.  [name] levETIRAcetam (KEPPRA) 500 MG tablet Take 1 tablet (500 mg total) by mouth 2 (two) times daily.  [name] linaclotide (LINZESS) 290 MCG CAPS capsule Take 290 mcg by mouth daily as needed (constipation).  . metoprolol succinate (TOPROL-XL) 100 MG 24 hr tablet Take 100 mg by mouth daily.  [name] nystatin cream (MYCOSTATIN) Apply 1 application topically 2 (two) times daily. (Patient taking differently: Apply 1 application topically 2 (two) times daily as needed for dry skin.)  . pantoprazole (PROTONIX) 40 MG tablet Take 1 tablet (40 mg total) by mouth daily.  . solifenacin (VESICARE) 10 MG tablet Take 1 tablet (10 mg total) by mouth daily.  [name] spironolactone (ALDACTONE) 50 MG  tablet Take 1 tablet (50 mg total) by mouth daily.  [name] UNIFINE PENTIPS 31G X 5 MM MISC USE TO INJECT INSULIN TWO TIMES DAILY  . XARELTO 20 MG TABS tablet TAKE ONE TABLET BY MOUTH EVERY DAY WITH SUPPER  . [DISCONTINUED] rivaroxaban (XARELTO) 20 MG TABS tablet TAKE ONE TABLET BY MOUTH EVERY DAY WITH SUPPER (Patient taking differently: Take 20 mg by mouth daily with supper.)     Review of Systems negative except from HPI and PMH  Physical Exam BP (!) 129/96   Pulse 97   Temp 97.6 F (36.4 C) (Oral)   Resp 20   Ht 5\' 9"  (1.753 m)   Wt 99.8 kg   LMP 12/13/2018 (Approximate)   SpO2 100%   BMI 32.49 kg/m  Well developed and well nourished in no acute distress HENT normal E scleral and icterus clear Neck Supple JVP flat; carotids brisk and full Clear to ausculation  Regular rate and rhythm, no murmurs gallops or rub Soft with active bowel sounds No clubbing cyanosis   Edema Alert and oriented, LHP Skin Warm and Dry    Assessment and  Plan  Nonischemic cardiomyopathy  Left bundle branch block  Congestive heart failure-/acute chronic-systolic  CRT-D-Medtronic device at RRT  Stroke with residual hemiparesis  Atrial fibrillation-SCAF  Anticoagulation with Xarelto   For gen rep

## 2021-03-18 NOTE — Progress Notes (Signed)
Pt ambulated without difficulty or bleeding.   Discharged home with daughter who will drive and stay with pt x 24 hrs 

## 2021-03-19 ENCOUNTER — Encounter (HOSPITAL_COMMUNITY): Payer: Self-pay | Admitting: Internal Medicine

## 2021-03-19 MED FILL — Lidocaine HCl Local Inj 1%: INTRAMUSCULAR | Qty: 60 | Status: AC

## 2021-03-20 ENCOUNTER — Encounter (HOSPITAL_COMMUNITY): Payer: Self-pay | Admitting: Internal Medicine

## 2021-03-24 ENCOUNTER — Ambulatory Visit: Payer: Medicaid Other | Admitting: Behavioral Health

## 2021-03-24 ENCOUNTER — Other Ambulatory Visit: Payer: Self-pay

## 2021-03-24 ENCOUNTER — Telehealth: Payer: Self-pay

## 2021-03-24 DIAGNOSIS — F331 Major depressive disorder, recurrent, moderate: Secondary | ICD-10-CM

## 2021-03-24 DIAGNOSIS — F419 Anxiety disorder, unspecified: Secondary | ICD-10-CM

## 2021-03-24 NOTE — Telephone Encounter (Signed)
Spoke with pt and advised per Dr Klein labs are normal.  Pt verbalizes understanding and thanked RN for the phone call. 

## 2021-03-24 NOTE — Telephone Encounter (Signed)
-----   Message from Steven C Klein, MD sent at 03/21/2021  4:24 PM EDT ----- Please Inform Patient that labs are normal  Thanks  

## 2021-03-24 NOTE — BH Specialist Note (Signed)
Integrated Behavioral Health via Telemedicine Visit  03/24/2021 [name] [id number]  Number of Integrated Behavioral Health visits: 1/6 Session Start time: 1:00pm  Session End time: 1:30pm Total time: 30  Referring Provider: Dr. Judeth Cornfield, MD Patient/Family location: Pt at home in private [facility] Provider location: [facility] Office All persons participating in visit: Pt & Clinician Types of Service: Individual psychotherapy  I connected with [name] and/or Luster Landsberg Mcclellan's self via  Telephone or [employer]  (Video is Surveyor, mining) and verified that I am speaking with the correct person using two identifiers. Discussed confidentiality: Yes   I discussed the limitations of telemedicine and the availability of in person appointments.  Discussed there is a possibility of technology failure and discussed alternative modes of communication if that failure occurs.  I discussed that engaging in this telemedicine visit, they consent to the provision of behavioral healthcare and the services will be billed under their insurance.  Patient and/or legal guardian expressed understanding and consented to Telemedicine visit: Yes   Presenting Concerns: Patient and/or family reports the following symptoms/concerns: Pt is having difficulty adjusting to challenges of post-stroke Sx, esp'ly in the midst of COVID-19 Duration of problem: for the past year; Severity of problem: moderate  Patient and/or Family's Strengths/Protective Factors: Social and Emotional competence, Sense of purpose and health status changes are a struggle  Goals Addressed: Patient will: 1.  Reduce symptoms of: anxiety, depression, stress and reduced frustration tolerance  2.  Increase knowledge and/or ability of: coping skills, healthy habits and stress reduction  3.  Demonstrate ability to: Increase healthy adjustment to current life circumstances and Increase adequate support  systems for patient/family  Progress towards Goals: Estb'd today  Interventions: Interventions utilized:  Supportive Counseling and Psychoeducation and/or Health Education Standardized Assessments completed: Not Needed  Patient and/or Family Response: Pt receptive to session & requests future appt  Assessment: Patient currently experiencing elevated anx due to Px limitations due to health complications that are chronic.  Patient may benefit from supportive cslg, psychoedu re: Family dynamics & [employer].  Plan: 1. Follow up with behavioral health clinician on : 2-3 wks on telehealth for 60 min 2. Behavioral recommendations: Take mental notes to focus our nxt session 3. Referral(s): Integrated Art gallery manager (In Clinic) and [employer]:  Housing and Pt is unable to write due to L-handedness & L-sided weakness   I discussed the assessment and treatment plan with the patient and/or parent/guardian. They were provided an opportunity to ask questions and all were answered. They agreed with the plan and demonstrated an understanding of the instructions.   They were advised to call back or seek an in-person evaluation if the symptoms worsen or if the condition fails to improve as anticipated.  Deneise Lever, LMFT

## 2021-03-30 ENCOUNTER — Encounter: Payer: Medicaid Other | Admitting: Student

## 2021-03-31 ENCOUNTER — Ambulatory Visit: Payer: Medicaid Other

## 2021-04-06 ENCOUNTER — Encounter: Payer: Self-pay | Admitting: Student

## 2021-04-06 NOTE — Progress Notes (Signed)
Cardiovascular and Mediastinum  Nonischemic cardiomyopathy (HCC)  Benign essential HTN  LBBB (left bundle branch block)  Chronic combined systolic and diastolic heart failure (HCC)  Atrial fibrillation (HCC)  Respiratory  OSA (obstructive sleep apnea)  Endocrine  Diabetic peripheral neuropathy (HCC)  Type 2 diabetes mellitus, uncontrolled, with neuropathy (HCC)  Nervous and Auditory  Hemiparesis affecting left side as late effect of cerebrovascular accident [facility]  Localization-related symptomatic epilepsy and epileptic syndromes with complex partial seizures, not intractable, without status epilepticus (HCC)  Left spastic hemiparesis (HCC)  Lumbar radiculopathy  Musculoskeletal and Integument  Primary osteoarthritis of left knee  Lumbar spondylosis  Candidal intertrigo  Rash  Other  Hyperlipidemia  Morbid obesity (HCC)  Depression  Health care maintenance  Mixed stress and urge urinary incontinence  ICD (implantable cardioverter-defibrillator) in place  Tachycardia  Urinary frequency  Left shoulder pain  Lumbago  Chronic use of opiate for therapeutic purpose  Perimenopausal  Contact with and (suspected) exposure to covid-19  Urinary incontinence  Vaginal discharge  Need for pneumococcal vaccine  Vitamin B12 deficiency

## 2021-04-08 ENCOUNTER — Other Ambulatory Visit: Payer: Self-pay

## 2021-04-08 DIAGNOSIS — N3946 Mixed incontinence: Secondary | ICD-10-CM

## 2021-04-08 MED ORDER — SOLIFENACIN SUCCINATE 10 MG PO TABS: 10.0000 mg | ORAL_TABLET | Freq: Every day | ORAL | 5 refills | Status: DC

## 2021-04-09 ENCOUNTER — Other Ambulatory Visit: Payer: Self-pay

## 2021-04-09 DIAGNOSIS — I5042 Chronic combined systolic (congestive) and diastolic (congestive) heart failure: Secondary | ICD-10-CM

## 2021-04-09 DIAGNOSIS — E1140 Type 2 diabetes mellitus with diabetic neuropathy, unspecified: Secondary | ICD-10-CM

## 2021-04-09 DIAGNOSIS — IMO0002 Reserved for concepts with insufficient information to code with codable children: Secondary | ICD-10-CM

## 2021-04-09 MED ORDER — PANTOPRAZOLE SODIUM 40 MG PO TBEC: 40.0000 mg | DELAYED_RELEASE_TABLET | Freq: Every day | ORAL | 3 refills | Status: DC

## 2021-04-09 MED ORDER — ISOSORBIDE MONONITRATE ER 60 MG PO TB24: 90.0000 mg | ORAL_TABLET | Freq: Every day | ORAL | 2 refills | Status: DC

## 2021-04-09 NOTE — Telephone Encounter (Signed)
RTC to [facility] and [employer].  Requesting new RX's for isosorbide and Pantoprazole since Dr. Sandre Kitty and Dr. Lorn Junes numbers are not going through. Will send to attending pool. Thank you, SChaplin, RN,BSN

## 2021-04-09 NOTE — Telephone Encounter (Signed)
Annice Pih with [facility] needs to speak with a nurse about getting another provider to send the Rx to the pharmacy. States Dr. Sandre Kitty and Dr. Austin Miles is not registered through [facility]. Please call back.

## 2021-04-13 ENCOUNTER — Telehealth (HOSPITAL_COMMUNITY): Payer: Self-pay | Admitting: Licensed Clinical Social Worker

## 2021-04-13 NOTE — Telephone Encounter (Signed)
CSW called pt to remind of appt tomorrow at clinic.  Pt aware of appt and reports no barriers to attending.  CSW sent instructions on how to get to clinic and parking code.  Will continue to follow and assist as needed  Burna Sis, LCSW Clinical Social Worker [facility] Desk#: [phone] Cell#: [phone]

## 2021-04-13 NOTE — Progress Notes (Signed)
Patient ID: [name], female   DOB: [birth year], 55 y.o.   MRN: [id number]    Advanced Heart Failure Clinic Note   EP: Dr [name] PCP: Dr [name] Primary HF Cardiologist: Dr [name]   HPI: Mrs [name] is a 55 year old with history of morbid obesity, HTN, HLD, CAD, DM2, OSA on CPAP, CVA and chronic systolic CHF in the setting of poorly-controlled HTN. She was admitted in 05/2011 with chest pain and new LBBB and ended up getting a left heart cath, which showed no angiographic CAD. Renal artery dopplers were normal in 04/2012.  She has a Medtronic CRT-D device.   Was hospitalized May 2014 with acute on chronic combined systolic and diastolic CHF. EF from December 2013 was down to 40 to 45%.   Admitted in January 2015 with slurred speech, L arm weakness and facial droop. Initial head CT showed no acute abnormality. Repeat head CT showed subtle gray white matter differentiation in R frontal lobe and R operculum perhaps representing ischemic R sided MCA. STAT repeat CT head later on 1/29 showed further evolution of R MCA territory infarct involving R insular cortex and operculum. No hemorrhage. Neurology recommended CTA head/neck, which showed occlusion of MCA branch to the area of R MCA infarct and a normal neck CTA. TEE was done and did not reveal embolic source. Was discharged to inpatient rehab.  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  She was started on Keppra.  She went to the ER in 2/16 with syncope that was probably due to seizure, no arrhythmias with device interrogation.  Echo in 11/16 showed EF back down to 25-30%.    03/2021 Had device generator change.    Today she returns for HF follow up. Overall feeling fine. SOB with steps/exertion. Denies PND/Orthopnea. She has  personal care services 6.5 hours Mon-Fri. Walks a few steps. No fever or chills. Not weighing daily. She has been out of norvasc, imdur, toprol xl, and spiro  for 1 month. Only taking lasix every other day. Says she does not want to take lasix daily. Lives with brother.   ECHO 11/2012: 40-45% ECHO  07/2013: 20% RV normal ECHO    08/23/2013: EF 40-45%, diff HK ECHO   09/05/2013: EF 25-30%, diff HK  ECHO  12/11/13: EF 30%, diff HK, RV normal ECHO 01/14/2014: EF 35-40%, LA mod dilated  ECHO 4/16: EF 40-45%, moderate LVH, normal RV size with mildly decreased systolic function.   ECHO 11/16: EF 25-30%, moderate LVH ECHO 2/18: EF 45%, moderate LVH, normal RV size and systolic function.   Labs 06/2013: proBNP 230, Cr 0.9, K 3.7          08/2013: K+ 3.9, Cr 0.9       09/2013: K 4.2, Cr 0.86, pro-BNP 718, dig level <0.3     10/07/13: K+ 4.1, Cr 0.83       05/08/14: K 4.0 Creatinine 0.67 Pro BNP 233 Dig level 0.3        08/20/2014 K 3.8 nCreatinine 0.56        2/16: K 3.6, creatinine 0.95, LDL 106, HDL 43       3/16: K 3.5, creatinine 0.87, HCT 35.9       7/16: K 3.5, creatinine 0.78. HCT 37       10/16: digoxin 0.2, LDL 116       1/17: K 3.6, creatinine 0.77, HCT 38  5/17: digoxin 0.9       8/17: K 4.2, creatinine 0.68, HCT 35.7       11/17: K 3.8, creatinine 0.76        6/18: digoxin 0.9, K 3.5, creatinine 0.9, hgb 13.9  ROS: All systems negative except as listed in HPI, PMH and Problem List.  Past Medical History:  Diagnosis Date  . Allergic rhinitis   . Arthritis    "hips, back, legs, arms" (07/04/2014)  . Asthma    hx  . Automatic implantable cardioverter-defibrillator in situ   . Calcifying tendinitis of shoulder   . Chronic combined systolic and diastolic CHF (congestive heart failure) ([place])    EF 40-45% by echo 12/06/2012  . Chronic diastolic heart failure (HCC)     Primarily diastolic CHF: Likely due to uncontrolled HTN. Last echo (8/12) with EF 45-50%, mild to moderate LVH with some asymmetric septal hypertrophy, RV normal size and systolic function. EF 50-55% by LV-gram in 6/12.   . Chronic lower back pain    secondary to DJD,  obsetiy, hip problems. Followed by Dr. Oval Jacobs (pain management)  . Coronary artery disease    questionable. LHC 05/2011 showing normal coronaries // Followed at [facility], Dr. Aundra Jacobs  . Degeneration of lumbar or lumbosacral intervertebral disc   . DJD (degenerative joint disease) of hip    right sided  . Frequent UTI   . GERD (gastroesophageal reflux disease)   . HLD (hyperlipidemia)   . Hypertension    Poorly controlled. Has had HTN since age 26. Angioedema with ACEI.  24 Hr urine and renal arterial dopplers ordered . . . Never done  . LBBB (left bundle branch block)   . Liver disease   . Morbid obesity ([place])   . NICM (nonischemic cardiomyopathy) ([place])    EF 45-50% in 8/12, cath 6/12 showed normal coronaries, EF 50-55% by LV gram  . OSA on CPAP    sleep study in 8/12 showed moderate to severe OSA requiring CPAP  . Polyneuropathy in diabetes(357.2)   . Presence of permanent cardiac pacemaker   . Seizures ([place])    last 3 months  . Shortness of breath    none now  . Stroke [facility]) 12/2013   "my left side is paralyzed" (07/04/2014)  . Thoracic or lumbosacral neuritis or radiculitis, unspecified   . Type II diabetes mellitus (HCC) DX: 2002    Current Outpatient Medications  Medication Sig Dispense Refill  . Accu-Chek FastClix Lancets MISC Check blood sugar 2 times a day 102 each 12  . atorvastatin (LIPITOR) 40 MG tablet TAKE ONE TABLET BY MOUTH EVERY DAY (Patient taking differently: Take 40 mg by mouth daily.) 90 tablet 0  . Blood Glucose Monitoring Suppl (ACCU-CHEK GUIDE) w/Device KIT 1 each by Does not apply route 3 (three) times daily. 1 kit 1  . calamine lotion Apply 1 application topically as needed for itching. 120 mL 0  . cyanocobalamin 1000 MCG tablet Take 1 tablet by mouth daily.    . cyclobenzaprine (FLEXERIL) 5 MG tablet Take 1 tablet (5 mg total) by mouth 3 (three) times daily as needed for muscle spasms. 30 tablet 0  . diclofenac Sodium (VOLTAREN) 1 % GEL  APPLY 2 GRAMS TOPICALLY 4 (FOUR) TIMES DAILY. 300 g 1  . Dulaglutide (TRULICITY) 1.77 LT/9.0ZE SOPN Inject 0.75 mg into the skin once a week. (Patient taking differently: Inject 0.75 mg into the skin every Thursday.) 5 mL 4  . fluticasone (CUTIVATE) 0.05 %  cream Apply topically 2 (two) times daily. Dispense two tubes (Patient taking differently: Apply 1 application topically 2 (two) times daily as needed (rash).) 30 g 6  . folic acid (FOLVITE) 1 MG tablet TAKE ONE TABLET BY MOUTH DAILY (Patient taking differently: Take 1 mg by mouth daily.) 90 tablet 1  . furosemide (LASIX) 40 MG tablet Take 1 tablet (40 mg total) daily for 5 (five) days.  After 5 days then take 1 tablet (40 mg total) every other day. (Patient taking differently: Take 40 mg by mouth every other day.) 50 tablet 3  . gabapentin (NEURONTIN) 300 MG capsule Take 1 capsule (300 mg total) by mouth 3 (three) times daily. (Patient taking differently: Take 300 mg by mouth 2 (two) times daily.) 180 capsule 5  . glucose blood (ACCU-CHEK GUIDE) test strip Check blood sugar 2 times per day 100 each 10  . levETIRAcetam (KEPPRA) 500 MG tablet Take 1 tablet (500 mg total) by mouth 2 (two) times daily. 180 tablet 3  . linaclotide (LINZESS) 290 MCG CAPS capsule Take 290 mcg by mouth daily as needed (constipation).    . metoprolol succinate (TOPROL-XL) 100 MG 24 hr tablet Take 100 mg by mouth daily.    . pantoprazole (PROTONIX) 40 MG tablet Take 1 tablet (40 mg total) by mouth daily. 90 tablet 3  . solifenacin (VESICARE) 10 MG tablet Take 1 tablet (10 mg total) by mouth daily. 30 tablet 5  . STOOL SOFTENER/LAXATIVE 50-8.6 MG tablet TAKE ONE TABLET BY MOUTH AT BEDTIME AS NEEDED FOR MILD CONSTIPATION 30 tablet 0  . Vitamin D, Ergocalciferol, (DRISDOL) 50000 units CAPS capsule TAKE ONE CAPSULE BY MOUTH EVERY MONDAY 4 capsule 0  . XARELTO 20 MG TABS tablet TAKE ONE TABLET BY MOUTH EVERY DAY WITH SUPPER 30 tablet 2  . amLODipine (NORVASC) 10 MG tablet Take 1  tablet (10 mg total) by mouth daily. (Patient not taking: Reported on 04/14/2021) 90 tablet 3  . digoxin (LANOXIN) 0.125 MG tablet TAKE ONE TABLET BY MOUTH EVERY DAY (Patient not taking: Reported on 04/14/2021) 90 tablet 0  . isosorbide mononitrate (IMDUR) 60 MG 24 hr tablet Take 1.5 tablets (90 mg total) by mouth daily. (Patient not taking: Reported on 04/14/2021) 135 tablet 2  . spironolactone (ALDACTONE) 50 MG tablet Take 1 tablet (50 mg total) by mouth daily. (Patient not taking: Reported on 04/14/2021) 90 tablet 3   No current facility-administered medications for this encounter.   Vitals:   04/14/21 1053  BP: (!) 158/80  Pulse: (!) 110  SpO2: 98%  Weight: 111.5 kg (245 lb 12.8 oz)    Wt Readings from Last 3 Encounters:  04/14/21 111.5 kg (245 lb 12.8 oz)  03/18/21 99.8 kg (220 lb)  02/11/21 106.6 kg (235 lb)     PHYSICAL EXAM: General:  Arrived in a wheel chair. No resp difficulty HEENT: normal Neck: supple. JVP difficult to assess. Carotids 2+ bilat; no bruits. No lymphadenopathy or thryomegaly appreciated. Cor: PMI nondisplaced. Regular rate & rhythm. No rubs, gallops or murmurs. Lungs: clear Abdomen: soft, nontender, nondistended. No hepatosplenomegaly. No bruits or masses. Good bowel sounds. Extremities: no cyanosis, clubbing, rash, R and LLE trace-1+ edema. LLE brace RUE flacid Neuro: alert & orientedx3, cranial nerves grossly intact. moves all 4 extremities w/o difficulty. Affect pleasant  EKG: A sensed V paced 98 bpm   ASSESSMENT/PLAN: 1. Chronic systolic HF: Nonischemic cardiomyopathy, normal coronaries on 6/12 angiography.  Cardiomyopathy may be due to long-standing and poorly-controlled HTN.  Echo  11/16 EF 25-30%, echo 2/18 with EF improved to 45%.  S/P CRT-D Medtronic.  Had generator change 03/2021.  She has been out of toprol, spiro, amlodipine, and imdur for at least 30 days.  I am restarting Toprol, spiro, amlodipine, and imdur today. Stressed importance of medications.   - She does not want to take lasix daily. Continue lasix every other day.   - Not on entresto due to h/o angioedema with ACEi  2. HTN: Elevated but has not had meds. Restarting as noted above.  3. Morbid obesity:  Body mass index is 36.3 kg/m. 4. OSA on CPAP:  Continue CPAP hs.  5. Atrial fibrillation: Episode of atrial fibrillation in 11/17 noted on device interrogation. Patient had a CVA in the past.  CHADSVASC = 5.  - Continue Xarelto.  No bleeding issues.  6. CVA: Prior CVA with left hemiparesis.    Check BMET today.  Follow up in 4 weeks.   Quintarius Ferns NP-C  04/14/2021

## 2021-04-14 ENCOUNTER — Other Ambulatory Visit: Payer: Self-pay

## 2021-04-14 ENCOUNTER — Encounter (HOSPITAL_COMMUNITY): Payer: Medicaid Other

## 2021-04-14 ENCOUNTER — Encounter (HOSPITAL_COMMUNITY): Payer: Self-pay

## 2021-04-14 ENCOUNTER — Ambulatory Visit (HOSPITAL_COMMUNITY)
Admission: RE | Admit: 2021-04-14 | Discharge: 2021-04-14 | Disposition: A | Discharge status: Home / Self Care | Payer: Medicaid Other | Source: Ambulatory Visit | Attending: Adult Health | Admitting: Adult Health

## 2021-04-14 VITALS — BP 158/80 | HR 110 | Wt 245.8 lb

## 2021-04-14 DIAGNOSIS — Z7901 Long term (current) use of anticoagulants: Secondary | ICD-10-CM | POA: Diagnosis not present

## 2021-04-14 DIAGNOSIS — I2510 Atherosclerotic heart disease of native coronary artery without angina pectoris: Secondary | ICD-10-CM | POA: Diagnosis not present

## 2021-04-14 DIAGNOSIS — I447 Left bundle-branch block, unspecified: Secondary | ICD-10-CM | POA: Insufficient documentation

## 2021-04-14 DIAGNOSIS — Z95810 Presence of automatic (implantable) cardiac defibrillator: Secondary | ICD-10-CM

## 2021-04-14 DIAGNOSIS — I428 Other cardiomyopathies: Secondary | ICD-10-CM | POA: Insufficient documentation

## 2021-04-14 DIAGNOSIS — E119 Type 2 diabetes mellitus without complications: Secondary | ICD-10-CM | POA: Diagnosis not present

## 2021-04-14 DIAGNOSIS — G4733 Obstructive sleep apnea (adult) (pediatric): Secondary | ICD-10-CM | POA: Diagnosis not present

## 2021-04-14 DIAGNOSIS — Z8673 Personal history of transient ischemic attack (TIA), and cerebral infarction without residual deficits: Secondary | ICD-10-CM | POA: Diagnosis not present

## 2021-04-14 DIAGNOSIS — Z9114 Patient's other noncompliance with medication regimen: Secondary | ICD-10-CM | POA: Insufficient documentation

## 2021-04-14 DIAGNOSIS — I10 Essential (primary) hypertension: Secondary | ICD-10-CM

## 2021-04-14 DIAGNOSIS — Z6836 Body mass index (BMI) 36.0-36.9, adult: Secondary | ICD-10-CM | POA: Diagnosis not present

## 2021-04-14 DIAGNOSIS — Z79899 Other long term (current) drug therapy: Secondary | ICD-10-CM | POA: Insufficient documentation

## 2021-04-14 DIAGNOSIS — I4891 Unspecified atrial fibrillation: Secondary | ICD-10-CM | POA: Diagnosis not present

## 2021-04-14 DIAGNOSIS — E785 Hyperlipidemia, unspecified: Secondary | ICD-10-CM | POA: Insufficient documentation

## 2021-04-14 DIAGNOSIS — I110 Hypertensive heart disease with heart failure: Secondary | ICD-10-CM | POA: Diagnosis not present

## 2021-04-14 DIAGNOSIS — Z9989 Dependence on other enabling machines and devices: Secondary | ICD-10-CM

## 2021-04-14 DIAGNOSIS — I5042 Chronic combined systolic (congestive) and diastolic (congestive) heart failure: Secondary | ICD-10-CM | POA: Diagnosis not present

## 2021-04-14 LAB — BASIC METABOLIC PANEL
Anion gap: 6 (ref 5–15)
BUN: <5 mg/dL — ABNORMAL LOW (ref 6–20)
CO2: 27 mmol/L (ref 22–32)
Calcium: 9 mg/dL (ref 8.9–10.3)
Chloride: 106 mmol/L (ref 98–111)
Creatinine, Ser: 0.82 mg/dL (ref 0.44–1.00)
GFR, Estimated: >60 mL/min (ref >60)
Glucose, Bld: 133 mg/dL — ABNORMAL HIGH (ref 70–99)
Potassium: 3.6 mmol/L (ref 3.5–5.1)
Sodium: 139 mmol/L (ref 135–145)

## 2021-04-14 MED ORDER — AMLODIPINE BESYLATE 10 MG PO TABS: 10.0000 mg | ORAL_TABLET | Freq: Every day | ORAL | 3 refills | Status: DC

## 2021-04-14 MED ORDER — SPIRONOLACTONE 50 MG PO TABS: 50.0000 mg | ORAL_TABLET | Freq: Every day | ORAL | 3 refills | Status: DC

## 2021-04-14 MED ORDER — METOPROLOL SUCCINATE ER 100 MG PO TB24: 100.0000 mg | ORAL_TABLET | Freq: Every day | ORAL | 3 refills | Status: DC

## 2021-04-14 MED ORDER — FUROSEMIDE 40 MG PO TABS: 40.0000 mg | ORAL_TABLET | ORAL | 3 refills | Status: DC

## 2021-04-14 MED ORDER — ISOSORBIDE MONONITRATE ER 60 MG PO TB24: 90.0000 mg | ORAL_TABLET | Freq: Every day | ORAL | 2 refills | Status: DC

## 2021-04-14 NOTE — Patient Instructions (Signed)
Labs today --we will call with any abnormals.  Stop Digoxin Take Norvasc 10 mg daily Imdur 90 mg daily Toprol XL 100 mg daily Spironolactone 50 mg daily Lasix 40 mg every other day

## 2021-04-20 ENCOUNTER — Other Ambulatory Visit: Payer: Self-pay | Admitting: Internal Medicine

## 2021-04-20 DIAGNOSIS — I5042 Chronic combined systolic (congestive) and diastolic (congestive) heart failure: Secondary | ICD-10-CM

## 2021-04-20 DIAGNOSIS — IMO0002 Reserved for concepts with insufficient information to code with codable children: Secondary | ICD-10-CM

## 2021-04-20 DIAGNOSIS — E1140 Type 2 diabetes mellitus with diabetic neuropathy, unspecified: Secondary | ICD-10-CM

## 2021-04-21 ENCOUNTER — Other Ambulatory Visit: Payer: Self-pay

## 2021-04-21 ENCOUNTER — Ambulatory Visit: Payer: Medicaid Other | Admitting: Behavioral Health

## 2021-04-21 DIAGNOSIS — F331 Major depressive disorder, recurrent, moderate: Secondary | ICD-10-CM

## 2021-04-21 DIAGNOSIS — F419 Anxiety disorder, unspecified: Secondary | ICD-10-CM
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
## 2021-05-14 ENCOUNTER — Ambulatory Visit: Payer: Medicaid Other | Admitting: Behavioral Health

## 2021-05-14 ENCOUNTER — Other Ambulatory Visit: Payer: Self-pay

## 2021-05-14 ENCOUNTER — Telehealth: Payer: Self-pay | Admitting: *Deleted

## 2021-05-14 DIAGNOSIS — F331 Major depressive disorder, recurrent, moderate: Secondary | ICD-10-CM

## 2021-05-14 DIAGNOSIS — F419 Anxiety disorder, unspecified: Secondary | ICD-10-CM

## 2021-05-14 NOTE — BH Specialist Note (Signed)
Integrated Behavioral Health via Telemedicine Visit  05/14/2021 [name] [id number]  Number of Integrated Behavioral Health visits: 3/6 Session Start time: 2:00pm  Session End time: 2:30pm Total time: 30  Referring Provider: Dr. Judeth Cornfield, MD Patient/Family location: Pt in her car [facility] Provider location: [facility] All persons participating in visit: Pt & Clinician Types of Service: Telephone visit  I connected with [name] and/or [name]'s self via  Telephone or Video Enabled Telemedicine Application  (Video is Caregility application) and verified that I am speaking with the correct person using two identifiers. Discussed confidentiality: Yes   I discussed the limitations of telemedicine and the availability of in person appointments.  Discussed there is a possibility of technology failure and discussed alternative modes of communication if that failure occurs.  I discussed that engaging in this telemedicine visit, they consent to the provision of behavioral healthcare and the services will be billed under their insurance.  Patient and/or legal guardian expressed understanding and consented to Telemedicine visit: Yes   Presenting Concerns: Patient and/or family reports the following symptoms/concerns: elevated anxiety over pending lack of housing on 05/22/21. Pt cares for [name] who is giving her, "an attitude." She can find a 1 BR, but this leaves him alone. Duration of problem: wks to mos; Severity of problem: severe  Patient and/or Family's Strengths/Protective Factors: Social connections and Social and Emotional competence  Goals Addressed: Patient will: 1.  Reduce symptoms of: anxiety, depression and stress  2.  Increase knowledge and/or ability of: coping skills and stress reduction  3.  Demonstrate ability to: Increase healthy adjustment to current life circumstances  Progress towards Goals: Ongoing  Interventions: Interventions utilized:   Supportive Counseling Standardized Assessments completed: screeners prn  Patient and/or Family Response: Pt receptive to call today requests future appt  Assessment: Patient currently experiencing elevated sadness, tears, depressive mood & anxiety.   Patient may benefit from cont'd support for setting boundaries, considering medication support, & cont'd support from [employer]. Pt expectations for others may need to shift.  Plan: 1. Follow up with behavioral health clinician on : 2-3 wks on telehealth for 60 min 2. Behavioral recommendations: Cont to set boundaries w/other ppl who do not provide support. Be realistic about how others can help.  3. Referral(s): Integrated [employer] (In Clinic)  I discussed the assessment and treatment plan with the patient and/or parent/guardian. They were provided an opportunity to ask questions and all were answered. They agreed with the plan and demonstrated an understanding of the instructions.   They were advised to call back or seek an in-person evaluation if the symptoms worsen or if the condition fails to improve as anticipated.  Deneise Lever, LMFT

## 2021-05-14 NOTE — Chronic Care Management (AMB) (Addendum)
  Care Management   Note  05/14/2021 Name: [name] MRN: [id number] DOB: [birth year]  [name] is a 54 y.o. year old female who is a primary care patient of Merrilyn Puma, MD. I reached out to [name] by phone today in response to a referral sent by [name]'s PCP, Merrilyn Puma, MD.  [name] was given information about care management services today including:  1. Care management services include personalized support from designated clinical staff supervised by her physician, including individualized plan of care and coordination with other care providers 2. 24/7 contact phone numbers for assistance for urgent and routine care needs. 3. The patient may stop care management services at any time by phone call to the office staff.  Patient agreed to services and verbal consent obtained.   Follow up plan: Telephone appointment with care management team member scheduled for:05/18/2021  [facility] Guide, Embedded Care Coordination [facility] Management  Direct Dial [phone]

## 2021-05-18 ENCOUNTER — Ambulatory Visit: Payer: Medicaid Other | Admitting: Licensed Clinical Social Worker

## 2021-05-19 NOTE — Chronic Care Management (AMB) (Signed)
  Care Management   Social Work Visit Note  05/19/2021 Name: [name] MRN: [id number] DOB: [birth year]  [name] is a 55 y.o. year old female who sees Merrilyn Puma, MD for primary care. The care management team was consulted for assistance with care management and care coordination needs related to Financial Difficulties related to housing   Patient was given the following information about care management and care coordination services today, agreed to services, and gave verbal consent: 1.care management/care coordination services include personalized support from designated clinical staff supervised by their physician, including individualized plan of care and coordination with other care providers 2. 24/7 contact phone numbers for assistance for urgent and routine care needs. 3. The patient may stop care management/care coordination services at any time by phone call to the office staff.  Engaged with patient by telephone for initial visit in response to provider referral for social work chronic care management and care coordination services.  Assessment: Review of patient history, allergies, and health status during evaluation of patient need for care management/care coordination services.    Interventions:  . Patient interviewed and appropriate assessments performed . Collaborated with clinical team regarding patient needs  . Patient informed SW her rent is increasing from $850 to $1200. Patient informed SW she is moving and currently in the process of applying for a different resident. Patient stated finding a new resident is her only resource need.  . Patient stated she has a support system including her family. Patient stated her family will begin living with her and financially contributing to the bills. . Patient stated she receives SSI. [name] SW will look for additional housing resources within patients budget.  SDOH (Social Determinants of Health) assessments performed:  Yes     Plan:  . patient will work with BSW to address needs related to Housing barriers . Financial constraints  . The patient will call SW* as advised to update regarding housing applications. SW will look for additional housing resources and follow up with patient within 30 days. Christen Butter, BSW  Social Worker [facility] Care Management  [phone]

## 2021-05-19 NOTE — Patient Instructions (Signed)
Visit Information  Instructions: patient will work with SW to address concerns related to affordable housing.  Patient was given the following information about care management and care coordination services today, agreed to services, and gave verbal consent: 1.care management/care coordination services include personalized support from designated clinical staff supervised by their physician, including individualized plan of care and coordination with other care providers 2. 24/7 contact phone numbers for assistance for urgent and routine care needs. 3. The patient may stop care management/care coordination services at any time by phone call to the office staff.  Patient verbalizes understanding of instructions provided today and agrees to view in MyChart.   The patient will call SW* as advised to update with housing application statuses.Christen Butter, BSW  Social Worker [facility] Care Management  [phone]

## 2021-05-23 ENCOUNTER — Other Ambulatory Visit: Payer: Self-pay | Admitting: Internal Medicine

## 2021-05-23 DIAGNOSIS — I4891 Unspecified atrial fibrillation: Secondary | ICD-10-CM

## 2021-05-25 ENCOUNTER — Ambulatory Visit (INDEPENDENT_AMBULATORY_CARE_PROVIDER_SITE_OTHER): Payer: Medicaid Other

## 2021-05-25 ENCOUNTER — Inpatient Hospital Stay (HOSPITAL_COMMUNITY)
Admission: RE | Admit: 2021-05-25 | Discharge: 2021-05-25 | Disposition: A | Discharge status: Home / Self Care | Payer: Medicaid Other | Source: Ambulatory Visit

## 2021-05-25 DIAGNOSIS — I5042 Chronic combined systolic (congestive) and diastolic (congestive) heart failure: Secondary | ICD-10-CM | POA: Diagnosis not present

## 2021-05-25 DIAGNOSIS — Z95810 Presence of automatic (implantable) cardiac defibrillator: Secondary | ICD-10-CM

## 2021-05-26 ENCOUNTER — Telehealth: Payer: Self-pay

## 2021-05-26 NOTE — Telephone Encounter (Signed)
Returning phone call. Discussed with patient about LV lead testing and device clinic apt made for 05/28/21 @ 4:00 pm. Location, date and time discussed with verbal understanding.

## 2021-05-26 NOTE — Telephone Encounter (Signed)
Unable to measure LV threshold due to competing rhythm or short AV. Impedance normal trend.  Attempted to contact patient to advise in need of device clinic apt to check LV lead manually.  No answer, LMTCB.

## 2021-05-27 NOTE — Progress Notes (Signed)
EPIC Encounter for ICM Monitoring  Patient Name: [name] is a 55 y.o. female Date: 05/27/2021 Primary Care Physican: Merrilyn Puma, MD Primary Cardiologist: Shirlee Latch Electrophysiologist: Joycelyn Schmid Pacing: 97.7%         02/11/2021 Weight: 235 lbs                                                         Transmission reviewed.    Optivol thoracic impedance suggesting  normal fluid levels   Prescribed: Furosemide 40 mg take 1 tablet every other day. Spironolactone 50 mg take 1 tablet daily.   Recommendations:  Office appointment with device clinic to check lead on 05/28/2021    Follow-up plan: ICM clinic phone appointment on 06/29/2021.   91 day device clinic remote transmission 06/17/2021.     EP/Cardiology Office Visits: 07/17/2021 with Dr Shirlee Latch.  06/30/2020 with Dr Graciela Husbands   Copy of ICM check sent to Dr. Graciela Husbands.   3 month ICM trend: 05/25/2021.    1 Year ICM trend:       [name], RN 05/27/2021 3:07 PM

## 2021-05-28 ENCOUNTER — Other Ambulatory Visit: Payer: Self-pay

## 2021-05-28 DIAGNOSIS — I428 Other cardiomyopathies: Secondary | ICD-10-CM

## 2021-05-28 DIAGNOSIS — I5042 Chronic combined systolic (congestive) and diastolic (congestive) heart failure: Secondary | ICD-10-CM

## 2021-06-01 ENCOUNTER — Ambulatory Visit: Payer: Medicaid Other | Admitting: Behavioral Health

## 2021-06-01 DIAGNOSIS — F331 Major depressive disorder, recurrent, moderate: Secondary | ICD-10-CM

## 2021-06-01 DIAGNOSIS — F419 Anxiety disorder, unspecified: Secondary | ICD-10-CM

## 2021-06-01 NOTE — BH Specialist Note (Signed)
Integrated Behavioral Health via Telemedicine Visit  06/01/2021 [name] [id number]  Number of Integrated Behavioral Health visits: 4/6 Session Start time: 10:00am  Session End time: 10:30am Total time: 30  Referring Provider: Dr. Brita Romp, MD Patient/Family location: Pt @ home in private [facility] Provider location: [facility] Office All persons participating in visit: Pt & Clinician Types of Service: Individual psychotherapy  I connected with [name] and/or [name]'s  self  via  Telephone or [employer]  (Video is Surveyor, mining) and verified that I am speaking with the correct person using two identifiers. Discussed confidentiality: Yes   I discussed the limitations of telemedicine and the availability of in person appointments.  Discussed there is a possibility of technology failure and discussed alternative modes of communication if that failure occurs.  I discussed that engaging in this telemedicine visit, they consent to the provision of behavioral healthcare and the services will be billed under their insurance.  Patient and/or legal guardian expressed understanding and consented to Telemedicine visit: Yes   Presenting Concerns: Patient and/or family reports the following symptoms/concerns: elevated anxiety Duration of problem: months; Severity of problem: moderate due to housing insecurity which is being fixed by Pt & [name].  Patient and/or Family's Strengths/Protective Factors: Social connections, Social and Emotional competence, and Sense of purpose  Goals Addressed: Patient will:  Reduce symptoms of: anxiety, depression, and stress   Increase knowledge and/or ability of: coping skills and stress reduction   Demonstrate ability to: Increase healthy adjustment to current life circumstances and Increase adequate support systems for patient/family  Progress towards Goals: Ongoing  Interventions: Interventions utilized:   Solution-Focused Strategies, Behavioral Activation, and Supportive Counseling Standardized Assessments completed: Not Needed  Patient and/or Family Response: Pt receptive to call & moving soon to new home. Pt & [name] have been insecure for housing.   Assessment: Patient currently experiencing improved housing results. Pt's Ex-BF has given [name] a job @ their [employer]. [name] is doing well w/the work. Pt has leaned into her faith during this difficult time. She is also trying to tend to her health.  Patient may benefit from achieving the upcoming move so her & [name] can relax.  Plan: Follow up with behavioral health clinician on : 2-3 wks on telehealth for 30 min ck-in Behavioral recommendations: none @ this time Referral(s): Integrated [employer] (In Clinic)  I discussed the assessment and treatment plan with the patient and/or parent/guardian. They were provided an opportunity to ask questions and all were answered. They agreed with the plan and demonstrated an understanding of the instructions.   They were advised to call back or seek an in-person evaluation if the symptoms worsen or if the condition fails to improve as anticipated.  Deneise Lever, LMFT

## 2021-06-10 NOTE — Telephone Encounter (Signed)
Opened in error

## 2021-06-17 ENCOUNTER — Telehealth: Payer: Self-pay

## 2021-06-17 ENCOUNTER — Ambulatory Visit: Payer: Medicaid Other | Admitting: Licensed Clinical Social Worker

## 2021-06-17 NOTE — Patient Instructions (Signed)
Visit Information  Instructions: patient will work with SW to address concerns related to Housing   Patient was given the following information about care management and care coordination services today, agreed to services, and gave verbal consent: 1.care management/care coordination services include personalized support from designated clinical staff supervised by their physician, including individualized plan of care and coordination with other care providers 2. 24/7 contact phone numbers for assistance for urgent and routine care needs. 3. The patient may stop care management/care coordination services at any time by phone call to the office staff.  Patient verbalizes understanding of instructions provided today and agrees to view in MyChart.   The care management team will reach out to the patient again over the next 30 days.   Joetta Delprado, BSW  Social Worker [facility] Care Management  [phone]      

## 2021-06-17 NOTE — Telephone Encounter (Signed)
[facility] Imp Triage Nurse Pool Hello,   I spoke with patient on today. Patient stated as of yesterday, she is unable to walk. Patient stated she is having sciatic nerve pain starting at her belly button, and going around to her back. Patient stated she has no strength. Patient is requesting to be seen or contacted by a provider ASAP.   Thanks,   Christen Butter, BSW  Social Worker  [facility] Management  [phone]    TC to patient, she c/o right hip pain since yesterday and is having a difficult time ambulating d/t pain.  Yellow team is full the rest of the week.  Appt given for Friday, 06/19/21 w/ Dr. Cephus Slater at [phone].  Patient instructed if pain worsens or ambulation becomes more difficult to go to the ED or urgent care and she verbalized understanding. SChaplin, RN,BSN

## 2021-06-17 NOTE — Chronic Care Management (AMB) (Addendum)
  Care Management   Social Work Visit Note  06/17/2021 Name: [name] MRN: [id number] DOB: January 30, 1966  [name] is a 55 y.o. year old female who sees Merrilyn Puma, MD for primary care. The care management team was consulted for assistance with care management and care coordination needs related to  Housing.    Patient was given the following information about care management and care coordination services today, agreed to services, and gave verbal consent: 1.care management/care coordination services include personalized support from designated clinical staff supervised by their physician, including individualized plan of care and coordination with other care providers 2. 24/7 contact phone numbers for assistance for urgent and routine care needs. 3. The patient may stop care management/care coordination services at any time by phone call to the office staff.  Engaged with patient by telephone for follow up visit in response to provider referral for social work chronic care management and care coordination services.  Assessment: Review of patient history, allergies, and health status during evaluation of patient need for care management/care coordination services.    Interventions:  Patient interviewed and appropriate assessments performed Collaborated with clinical team regarding patient needs  Patient updated SW on today regarding housing. Patient has moved out of her prior home and currently living at [facility] [place] [address]. Patient has family support and they are assisting her. Patient is actively looking for a new residence.  Patient stated as of 06/16/21, she is experiencing sciatic pain which is preventing her from walking. Patient stated she has no strength. Patient stated she has family support with her and they are assisting and willing to drive her to medical appointments. Patient requested a call back from her PCP or RNCM. SW sent an in-basket to [facility] , Nurse Triage  and PCP.   SDOH (Social Determinants of Health) assessments performed: No     Plan:  patient will work with BSW to address needs related to Housing barriers Child psychotherapist will follow up with patient within the next 30-days.Christen Butter, BSW  Social Worker [facility]  [phone]

## 2021-06-18 NOTE — Telephone Encounter (Signed)
Agree with your plan. If she has any symptoms of worsening pain, loss of bowel or bladder, or fever, she needs to be seen emergently in the ED. Thanks

## 2021-06-19 ENCOUNTER — Ambulatory Visit (INDEPENDENT_AMBULATORY_CARE_PROVIDER_SITE_OTHER): Payer: Medicaid Other | Admitting: Internal Medicine

## 2021-06-19 ENCOUNTER — Telehealth: Payer: Self-pay | Admitting: *Deleted

## 2021-06-19 ENCOUNTER — Other Ambulatory Visit: Payer: Self-pay

## 2021-06-19 ENCOUNTER — Encounter: Payer: Self-pay | Admitting: Internal Medicine

## 2021-06-19 VITALS — BP 141/100 | HR 94 | Temp 98.3°F

## 2021-06-19 DIAGNOSIS — M1712 Unilateral primary osteoarthritis, left knee: Secondary | ICD-10-CM | POA: Diagnosis not present

## 2021-06-19 DIAGNOSIS — G8114 Spastic hemiplegia affecting left nondominant side: Secondary | ICD-10-CM

## 2021-06-19 DIAGNOSIS — M62838 Other muscle spasm: Secondary | ICD-10-CM

## 2021-06-19 DIAGNOSIS — M47816 Spondylosis without myelopathy or radiculopathy, lumbar region: Secondary | ICD-10-CM

## 2021-06-19 DIAGNOSIS — H2000 Unspecified acute and subacute iridocyclitis: Secondary | ICD-10-CM | POA: Diagnosis not present

## 2021-06-19 DIAGNOSIS — M5416 Radiculopathy, lumbar region: Secondary | ICD-10-CM | POA: Diagnosis not present

## 2021-06-19 DIAGNOSIS — I69354 Hemiplegia and hemiparesis following cerebral infarction affecting left non-dominant side: Secondary | ICD-10-CM

## 2021-06-19 DIAGNOSIS — N3000 Acute cystitis without hematuria: Secondary | ICD-10-CM

## 2021-06-19 HISTORY — DX: Acute cystitis without hematuria: N30.00

## 2021-06-19 HISTORY — DX: Other muscle spasm: M62.838

## 2021-06-19 LAB — POCT URINALYSIS DIPSTICK
Bilirubin, UA: NEGATIVE
Glucose, UA: NEGATIVE
Nitrite, UA: POSITIVE
Protein, UA: POSITIVE — AB
Spec Grav, UA: >=1.03 — AB (ref 1.010–1.025)
Urobilinogen, UA: 2 E.U./dL — AB
pH, UA: 6 (ref 5.0–8.0)

## 2021-06-19 MED ORDER — BACLOFEN 10 MG PO TABS
10.0000 mg | ORAL_TABLET | Freq: Three times a day (TID) | ORAL | 1 refills | Status: DC
Start: 2021-06-19 — End: 2021-06-19

## 2021-06-19 MED ORDER — NITROFURANTOIN MONOHYD MACRO 100 MG PO CAPS: 100.0000 mg | ORAL_CAPSULE | Freq: Two times a day (BID) | ORAL | 0 refills | Status: AC

## 2021-06-19 MED ORDER — BACLOFEN 10 MG PO TABS: 10.0000 mg | ORAL_TABLET | Freq: Three times a day (TID) | ORAL | 0 refills | Status: AC

## 2021-06-19 MED ORDER — NITROFURANTOIN MONOHYD MACRO 100 MG PO CAPS: 100.0000 mg | ORAL_CAPSULE | Freq: Two times a day (BID) | ORAL | 0 refills | Status: DC

## 2021-06-19 NOTE — Assessment & Plan Note (Signed)
[name] presented with complaint of lower right sided back pain. She also experiences muscles spasms on the left side of her body. 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 Since that time she experiences spasms on the left side. No sensation to pain or touch on the left side.  PLAN: Discontinue Flexeril  Start Baclofen 10mg  TID and take with an NSAID, follow up in 2 weeks

## 2021-06-19 NOTE — Telephone Encounter (Signed)
Completed order, and CMN for urinary incontinence supplies faxed to Jamesetta Orleans B at [facility] at [phone]. Fax confirmation receipt received.

## 2021-06-19 NOTE — Assessment & Plan Note (Signed)
[name] presented with complaint of urinary urgency and foul smelling urine for the past 2 weeks. She recently diagnosed with UTI 3 months ago and was treated with amoxicillin. Urine dispstick in office revealed positive for nitrates.  PLAN: UA and urine culture was ordered today 06/19/21 Started on Macrobid 100mg  BID 5 days

## 2021-06-19 NOTE — Patient Instructions (Signed)
Your urine dipstick is posittive for nitrates which means you do have a UTI. I have sent macrobid to your pharmacy. Take twice daily for 5 days.  2. I will send a referral for Physical Therapy  3. I will prescribe Baclofen for your muscle spasms that you experience in your back and left side of your body.
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
## 2021-06-19 NOTE — Addendum Note (Signed)
Addended by: Belva Agee on: 06/19/2021 12:02 PM   Modules accepted: Orders

## 2021-06-20 LAB — MICROSCOPIC EXAMINATION
Casts: NONE SEEN /lpf
Epithelial Cells (non renal): >10 /hpf — AB (ref 0–10)
RBC, Urine: >30 /hpf — AB (ref 0–2)
WBC, UA: >30 /hpf — AB (ref 0–5)

## 2021-06-20 LAB — URINALYSIS, ROUTINE W REFLEX MICROSCOPIC
Bilirubin, UA: NEGATIVE
Glucose, UA: NEGATIVE
Nitrite, UA: NEGATIVE
Specific Gravity, UA: 1.025 (ref 1.005–1.030)
Urobilinogen, Ur: 1 mg/dL (ref 0.2–1.0)
pH, UA: 6.5 (ref 5.0–7.5)

## 2021-06-24 ENCOUNTER — Ambulatory Visit: Payer: Medicaid Other | Admitting: Behavioral Health

## 2021-06-24 DIAGNOSIS — F331 Major depressive disorder, recurrent, moderate: Secondary | ICD-10-CM

## 2021-06-24 DIAGNOSIS — Z59811 Housing instability, housed, with risk of homelessness: Secondary | ICD-10-CM

## 2021-06-24 DIAGNOSIS — F419 Anxiety disorder, unspecified: Secondary | ICD-10-CM

## 2021-06-24 LAB — URINE CULTURE

## 2021-06-24 NOTE — BH Specialist Note (Signed)
Integrated Behavioral Health via Telemedicine Visit  06/24/2021 [name] [id number]  Number of Integrated Behavioral Health visits: 5/6 Session Start time: 9:30am  Session End time: 10:00am Total time: 30  Referring Provider: Dr. Judeth Cornfield, MD Patient/Family location: Pt is in private in a Motel room she is renting until she finds suitable housing. [name] lives w/her, & is presently working the [employer]. [facility] Provider location: [facility] Office All persons participating in visit: Pt & Clinician Types of Service: Individual psychotherapy  I connected with [name] and/or [name]  self  via  Telephone or [employer]  (Video is Surveyor, mining) and verified that I am speaking with the correct person using two identifiers. Discussed confidentiality:  5th visit  I discussed the limitations of telemedicine and the availability of in person appointments.  Discussed there is a possibility of technology failure and discussed alternative modes of communication if that failure occurs.  I discussed that engaging in this telemedicine visit, they consent to the provision of behavioral healthcare and the services will be billed under their insurance.  Patient and/or legal guardian expressed understanding and consented to Telemedicine visit:  5th visit  Presenting Concerns: Patient and/or family reports the following symptoms/concerns: elevated anxiety which she addresses through strong faith belief system. Duration of problem: months ; Severity of problem: moderate  Patient and/or Family's Strengths/Protective Factors: Social connections, Social and Emotional competence, Concrete supports in place (healthy food, safe environments, etc.), Sense of purpose, and Physical Health (exercise, healthy diet, medication compliance, etc.)  Goals Addressed: Patient will:  Reduce symptoms of: anxiety, depression, and stress   Increase knowledge and/or  ability of: coping skills and stress reduction   Demonstrate ability to: Increase healthy adjustment to current life circumstances  Progress towards Goals: Ongoing  Interventions: Interventions utilized:  Solution-Focused Strategies, Mindfulness or Relaxation Training, and Supportive Counseling Standardized Assessments completed:  screeners prn  Patient and/or Family Response: Pt receptive & appreciative of call today. Pt requests the calls cont as she will cont this journey for a secure home until it happens. Pt expressed how much encouragement means to her.   Assessment: Patient currently experiencing stress, anx/dep due to housing insecurity w/no indication of being remedied soon. Pt is relying on her faith & the goodness of her Family as they assist her during this navigation to a new residence. Dtr Jerl Santos is helping. Pt is recovering from a UTI. She does not feel 100% yet, but her urine is clear & she has no pain or pressure.  Patient may benefit from cont'd ck-in calls that are not rushed where she can process life events & receive encouragement & support for her faith.   Plan: Follow up with behavioral health clinician on : 2-3 wks on telehealth for 30 min Behavioral recommendations: Cont to exercise & do the best you can through walking-even a little helps a lot. Don't miss your 2 wk f/u with Dr. Ruben Im Referral(s): Integrated [employer] (In Clinic)  I discussed the assessment and treatment plan with the patient and/or parent/guardian. They were provided an opportunity to ask questions and all were answered. They agreed with the plan and demonstrated an understanding of the instructions.   They were advised to call back or seek an in-person evaluation if the symptoms worsen or if the condition fails to improve as anticipated.  Deneise Lever, LMFT

## 2021-06-28 ENCOUNTER — Other Ambulatory Visit: Payer: Self-pay | Admitting: Student

## 2021-06-29 NOTE — Progress Notes (Signed)
Internal Medicine Clinic Attending  I saw and evaluated the patient.  I personally confirmed the key portions of the history and exam documented by Dr. Ariwodo and I reviewed pertinent patient test results.  The assessment, diagnosis, and plan were formulated together and I agree with the documentation in the resident's note.   

## 2021-06-30 ENCOUNTER — Encounter: Payer: Medicaid Other | Admitting: Internal Medicine

## 2021-06-30 DIAGNOSIS — I428 Other cardiomyopathies: Secondary | ICD-10-CM

## 2021-06-30 DIAGNOSIS — Z95810 Presence of automatic (implantable) cardiac defibrillator: Secondary | ICD-10-CM

## 2021-06-30 DIAGNOSIS — I5042 Chronic combined systolic (congestive) and diastolic (congestive) heart failure: Secondary | ICD-10-CM

## 2021-07-02 ENCOUNTER — Telehealth: Payer: Self-pay

## 2021-07-02 NOTE — Telephone Encounter (Signed)
I spoke with the patient and she states she will transmit when she get home. I gave her the device clinic number in case she needs help.

## 2021-07-07 ENCOUNTER — Other Ambulatory Visit: Payer: Self-pay

## 2021-07-07 ENCOUNTER — Ambulatory Visit (INDEPENDENT_AMBULATORY_CARE_PROVIDER_SITE_OTHER): Payer: Medicaid Other | Admitting: Student

## 2021-07-07 VITALS — BP 131/86 | HR 80 | Temp 97.9°F | Wt 246.8 lb

## 2021-07-07 DIAGNOSIS — G8114 Spastic hemiplegia affecting left nondominant side: Secondary | ICD-10-CM

## 2021-07-07 DIAGNOSIS — Z0000 Encounter for general adult medical examination without abnormal findings: Secondary | ICD-10-CM | POA: Diagnosis not present

## 2021-07-07 DIAGNOSIS — E1140 Type 2 diabetes mellitus with diabetic neuropathy, unspecified: Secondary | ICD-10-CM | POA: Diagnosis not present

## 2021-07-07 DIAGNOSIS — E1165 Type 2 diabetes mellitus with hyperglycemia: Secondary | ICD-10-CM

## 2021-07-07 DIAGNOSIS — IMO0002 Reserved for concepts with insufficient information to code with codable children: Secondary | ICD-10-CM

## 2021-07-07 DIAGNOSIS — Z1211 Encounter for screening for malignant neoplasm of colon: Secondary | ICD-10-CM | POA: Diagnosis not present

## 2021-07-07 DIAGNOSIS — E1142 Type 2 diabetes mellitus with diabetic polyneuropathy: Secondary | ICD-10-CM

## 2021-07-07 DIAGNOSIS — R32 Unspecified urinary incontinence: Secondary | ICD-10-CM | POA: Diagnosis not present

## 2021-07-07 DIAGNOSIS — N3000 Acute cystitis without hematuria: Secondary | ICD-10-CM | POA: Diagnosis not present

## 2021-07-07 NOTE — Assessment & Plan Note (Signed)
States that her urinary symptoms have significantly improved with 5-day course of macrobid. Denies increased urinary frequency/urgency and no longer having foul smelling urine.

## 2021-07-07 NOTE — Assessment & Plan Note (Signed)
>>  ASSESSMENT AND PLAN FOR URINARY INCONTINENCE WRITTEN ON 07/07/2021  1:59 PM BY Merrilyn Puma, MD  Patient requesting incontinence supplies as this month's supplies have not arrived yet. Incontinence supplies have been reordered.

## 2021-07-07 NOTE — Progress Notes (Signed)
CC: f/u for T2DM and left spastic hemiparesis  HPI:  [name] is a 55 y.o. female with history listed below presenting to the [facility] for f/u for T2DM and left spastic hemiparesis. Please see individualized problem based charting for full HPI.  Past Medical History:  Diagnosis Date   Allergic rhinitis    Arthritis    "hips, back, legs, arms" (07/04/2014)   Asthma    hx   Automatic implantable cardioverter-defibrillator in situ    Calcifying tendinitis of shoulder    Chronic combined systolic and diastolic CHF (congestive heart failure) (HCC)    EF 40-45% by echo 12/06/2012   Chronic diastolic heart failure (HCC)     Primarily diastolic CHF: Likely due to uncontrolled HTN. Last echo (8/12) with EF 45-50%, mild to moderate LVH with some asymmetric septal hypertrophy, RV normal size and systolic function. EF 50-55% by LV-gram in 6/12.    Chronic lower back pain    secondary to DJD, obsetiy, hip problems. Followed by Dr. Ivory Broad (pain management)   Coronary artery disease    questionable. LHC 05/2011 showing normal coronaries // Followed at [facility], Dr. 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 Has had HTN since age 66. Angioedema with ACEI.  24 Hr urine and renal arterial dopplers ordered . . . Never done   LBBB (left bundle branch block)    Liver disease    Morbid obesity (HCC)    NICM (nonischemic cardiomyopathy) (HCC)    EF 45-50% in 8/12, cath 6/12 showed normal coronaries, EF 50-55% by LV gram   OSA on CPAP    sleep study in 8/12 showed moderate to severe OSA requiring CPAP   Polyneuropathy in diabetes(357.2)    Presence of permanent cardiac pacemaker    Seizures (HCC)    last 3 months   Shortness of breath    none now   Stroke [facility]) 12/2013   "my  left side is paralyzed" (07/04/2014)   Thoracic or lumbosacral neuritis or radiculitis, unspecified    Type II diabetes mellitus (HCC) DX: 2002    Review of Systems:  Negative aside from that listed in individualized problem based charting.  Physical Exam:  Vitals:   07/07/21 1320  Weight: 246 lb 12.8 oz (111.9 kg)   Physical Exam Constitutional:      Appearance: She is obese. She is not ill-appearing.  HENT:     Mouth/Throat:     Mouth: Mucous membranes are moist.     Pharynx: Oropharynx is clear. No oropharyngeal exudate.  Eyes:     Extraocular Movements: Extraocular movements intact.     Conjunctiva/sclera: Conjunctivae normal.     Pupils: Pupils are equal, round, and reactive to light.  Cardiovascular:     Rate and Rhythm: Normal rate and regular rhythm.     Pulses: Normal pulses.     Heart sounds: Normal heart sounds. No murmur heard.   No friction rub. No gallop.  Pulmonary:     Effort: Pulmonary effort is normal.     Breath sounds: Normal breath sounds. No wheezing, rhonchi or rales.  Abdominal:     General: Bowel sounds are normal. There is no distension.     Palpations: Abdomen is soft.  Tenderness: There is no abdominal tenderness.  Musculoskeletal:     Cervical back: Normal range of motion.  Skin:    General: Skin is warm and dry.  Neurological:     Mental Status: She is alert and oriented to person, place, and time.     Comments: Left spastic hemiparesis  Psychiatric:        Mood and Affect: Mood normal.        Behavior: Behavior normal.     Assessment & Plan:   See Encounters Tab for problem based charting.  Patient discussed with Dr.  Mayford Knife

## 2021-07-07 NOTE — Assessment & Plan Note (Addendum)
Patient with longstanding history of uncontrolled T2DM, currently on trulicity 0.75 weekly. She is likely undercontrolled with this alone. Will obtain HbA1c today as she is due for one. Will likely need additional diabetic medications. Appears that she was previously on lantus 52 units as well, but patient is unsure when this was discontinued. [place] Pepper was also tried in the past but unfortunately had to be discontinued due to frequent UTIs.   Plan: -continue trulicity -f/u hbA1c -please order urine microalbumin:creatinine ratio at next visit (unable to produce urine this visit) -may need to add additional therapy -f/u in 3 months

## 2021-07-07 NOTE — Progress Notes (Signed)
No ICM remote transmission received for 06/29/2021 and next ICM transmission scheduled for 07/20/2021.    

## 2021-07-07 NOTE — Assessment & Plan Note (Signed)
Patient agreed for referral to GI for screening colonoscopy as she is due for one.   Placed DME other order for left foot brace (for patient's diabetic neuropathy and left spastic hemiparesis support).

## 2021-07-07 NOTE — Assessment & Plan Note (Addendum)
Patient requesting incontinence supplies as this month's supplies have not arrived yet. Incontinence supplies have been reordered.

## 2021-07-07 NOTE — Patient Instructions (Signed)
[name],  It was a pleasure seeing you in the clinic today.   We are getting some labs today. I will call you if any of them are abnormal. We will work on obtaining your left foot brace and incontinence supplies. I have sent a referral to the stomach doctors for a colonoscopy. Please come back in 3 months.  Please call our clinic at [phone] if you have any questions or concerns. The best time to call is Monday-Friday from 9am-4pm, but there is someone available 24/7 at the same number. If you need medication refills, please notify your pharmacy one week in advance and they will send [place] a request.   Thank you for letting us take part in your care. We look forward to seeing you next time!

## 2021-07-08 LAB — LIPID PANEL
Chol/HDL Ratio: 4.7 ratio — ABNORMAL HIGH (ref 0.0–4.4)
Cholesterol, Total: 175 mg/dL (ref 100–199)
HDL: 37 mg/dL — ABNORMAL LOW (ref >39)
LDL Chol Calc (NIH): 112 mg/dL — ABNORMAL HIGH (ref 0–99)
Triglycerides: 146 mg/dL (ref 0–149)
VLDL Cholesterol Cal: 26 mg/dL (ref 5–40)

## 2021-07-08 LAB — HEMOGLOBIN A1C
Est. average glucose Bld gHb Est-mCnc: 166 mg/dL
Hgb A1c MFr Bld: 7.4 % — ABNORMAL HIGH (ref 4.8–5.6)

## 2021-07-12 NOTE — Progress Notes (Signed)
Internal Medicine Clinic Attending  Case discussed with Dr. Jinwala  At the time of the visit.  We reviewed the resident's history and exam and pertinent patient test results.  I agree with the assessment, diagnosis, and plan of care documented in the resident's note.  

## 2021-07-13 ENCOUNTER — Telehealth: Payer: Self-pay | Admitting: Student

## 2021-07-13 NOTE — Telephone Encounter (Signed)
Order for left foot brace, demographics, and OV notes from 7/26 faxed to [facility] at [phone]. Fax confirmation receipt received.   Also, received fax from [place] clinic today that needs to be signed by Dr. Criselda Peaches. Will put it in her box for completion.

## 2021-07-13 NOTE — Telephone Encounter (Signed)
Pt seen on  Tuesday 07/07/2021 by her PCP Dr. Austin Miles stating her DME order for her leg brace has not been sent to the [facility] clinic yet.  Patient requesting a call back in reference to why her prescription has not been faxed to their store yet.  Please call the patient back.

## 2021-07-14 ENCOUNTER — Telehealth: Payer: Self-pay | Admitting: Dietician

## 2021-07-14 NOTE — Telephone Encounter (Signed)
Confirmed with [name] at [facility] that they received fax for left foot brace and patient has been scheduled for 8/11 at 1100 for fitting.

## 2021-07-14 NOTE — Telephone Encounter (Signed)
Patient agreeable to a referral to [facility] eye care.

## 2021-07-15 ENCOUNTER — Other Ambulatory Visit: Payer: Self-pay | Admitting: Internal Medicine

## 2021-07-15 ENCOUNTER — Ambulatory Visit: Payer: Medicaid Other | Admitting: Behavioral Health

## 2021-07-15 DIAGNOSIS — IMO0002 Reserved for concepts with insufficient information to code with codable children: Secondary | ICD-10-CM

## 2021-07-15 DIAGNOSIS — F331 Major depressive disorder, recurrent, moderate: Secondary | ICD-10-CM

## 2021-07-15 DIAGNOSIS — E1140 Type 2 diabetes mellitus with diabetic neuropathy, unspecified: Secondary | ICD-10-CM

## 2021-07-15 DIAGNOSIS — F419 Anxiety disorder, unspecified: Secondary | ICD-10-CM

## 2021-07-15 NOTE — BH Specialist Note (Signed)
Integrated Behavioral Health via Telemedicine Visit  07/15/2021 OFILIA RAYON [id number]  Number of Integrated Behavioral Health visits: 6/6 Session Start time: 1:00pm  Session End time: 1:30pm Total time: 30  Referring Provider: Dr. Judeth Cornfield, MD Patient/Family location: Pt in private @ home [facility] Provider location: [facility] Office All persons participating in visit: Pt & Clinician Types of Service: Individual psychotherapy  I connected with [name] and/or Luster Landsberg Lepore's  self  via  Telephone or [employer]  (Video is Surveyor, mining) and verified that I am speaking with the correct person using two identifiers. Discussed confidentiality:  6th visit  I discussed the limitations of telemedicine and the availability of in person appointments.  Discussed there is a possibility of technology failure and discussed alternative modes of communication if that failure occurs.  I discussed that engaging in this telemedicine visit, they consent to the provision of behavioral healthcare and the services will be billed under their insurance.  Patient and/or legal guardian expressed understanding and consented to Telemedicine visit:  6th visit  Presenting Concerns: Patient and/or family reports the following symptoms/concerns: Pt has reduced anx due to housing insecurity issues that have likely been resolved. She expects to move from [facility] to new residence on Fri, Aug 5th, 2022 Duration of problem: months; Severity of problem: moderate trending mild  Patient and/or Family's Strengths/Protective Factors: Social and Emotional competence, Sense of purpose, Physical Health (exercise, healthy diet, medication compliance, etc.), and Pt resilience factors  Goals Addressed: Patient will:  Reduce symptoms of: anxiety, depression, and stress   Increase knowledge and/or ability of: coping skills and stress reduction   Demonstrate ability to: Increase healthy  adjustment to current life circumstances and Increase adequate support systems for patient/family  Progress towards Goals: Ongoing  Interventions: Interventions utilized:  Solution-Focused Strategies, Behavioral Activation, and Supportive Counseling Standardized Assessments completed:  screeners prn  Patient and/or Family Response: Pt receptive to call & RC to [facility] to Clinician. Pt requests future appt for support  Assessment: Patient currently experiencing a reduction in her anxiety & worry due to successfully securing new housing, taking charge of her own well-being & not others, & adjusting to her new Pacer that was placed in April.   Patient may benefit from cont'd encouragement & support for her efforts managing her life issues.  Plan: Follow up with behavioral health clinician on : 2-3 wks on telehealth for 30 min Behavioral recommendations: Be safe w/your move & cont to set healthy boundaries w/Family Referral(s): Integrated Behavioral Health Services (In Clinic) and Chronic Care Mgmt Referral to Christen Butter, [place]  I discussed the assessment and treatment plan with the patient and/or parent/guardian. They were provided an opportunity to ask questions and all were answered. They agreed with the plan and demonstrated an understanding of the instructions.   They were advised to call back or seek an in-person evaluation if the symptoms worsen or if the condition fails to improve as anticipated.  Deneise Lever, LMFT

## 2021-07-15 NOTE — Telephone Encounter (Signed)
Referral placed.

## 2021-07-15 NOTE — Progress Notes (Signed)
-   Ambulatory referral to Ophthalmology   

## 2021-07-16 ENCOUNTER — Ambulatory Visit: Payer: Medicaid Other | Admitting: Licensed Clinical Social Worker

## 2021-07-16 NOTE — Chronic Care Management (AMB) (Signed)
  Care Management   Social Work Visit Note  07/16/2021 Name: [name] MRN: [id number] DOB: 21-Nov-1966  [name] is a 55 y.o. year old female who sees Merrilyn Puma, MD for primary care. The care management team was consulted for assistance with care management and care coordination needs related to  housing    Patient was given the following information about care management and care coordination services today, agreed to services, and gave verbal consent: 1.care management/care coordination services include personalized support from designated clinical staff supervised by their physician, including individualized plan of care and coordination with other care providers 2. 24/7 contact phone numbers for assistance for urgent and routine care needs. 3. The patient may stop care management/care coordination services at any time by phone call to the office staff.  Engaged with patient by telephone for follow up visit in response to provider referral for social work chronic care management and care coordination services.  Assessment: Review of patient history, allergies, and health status during evaluation of patient need for care management/care coordination services.    Interventions:  Patient interviewed and appropriate assessments performed Collaborated with clinical team regarding patient needs  Patient is now residing at [employer]. She left [employer] due to cost. Patient advised her apartment is expected to be ready by the weekend or on Monday-August 8th. Patient stated she is running out of finances to cover hotel. Patient stated her worker with [employer] is assisting with funds for her new apartment. Patient has a daughter that she will utilize as last resort if apartment is not ready. SW discussed shelters as an option.  Patient disclosed feeling overwhelm. Patient discuss meeting recently with Dr. Monna Fam. SW advised patient to reach back out to Dr. Monna Fam if feelings persist or  get worse. Patient denied thoughts of self harm or harm to others. Patient spoke positive affirmation during call.  Patient denied needing any further assistance. SW verified patient has food, transportation and a support system.   SDOH (Social Determinants of Health) assessments performed: Yes     Plan:  SW will follow up with patient within the next 30 days.   Christen Butter, BSW  Social Worker [facility] Care Management  [phone]

## 2021-07-16 NOTE — Patient Instructions (Signed)
Visit Information  Instructions: patient will work with SW to address concerns related to housing.  Patient was given the following information about care management and care coordination services today, agreed to services, and gave verbal consent: 1.care management/care coordination services include personalized support from designated clinical staff supervised by their physician, including individualized plan of care and coordination with other care providers 2. 24/7 contact phone numbers for assistance for urgent and routine care needs. 3. The patient may stop care management/care coordination services at any time by phone call to the office staff.  Patient verbalizes understanding of instructions provided today and agrees to view in MyChart.   The care management team will reach out to the patient again over the next 30 days.  Randal Yepiz, BSW  Social Worker [facility] Care Management  [phone]      

## 2021-07-17 ENCOUNTER — Ambulatory Visit (HOSPITAL_BASED_OUTPATIENT_CLINIC_OR_DEPARTMENT_OTHER)
Admission: RE | Admit: 2021-07-17 | Discharge: 2021-07-17 | Disposition: A | Discharge status: Home / Self Care | Payer: Medicaid Other | Source: Ambulatory Visit | Attending: Cardiology | Admitting: Cardiology

## 2021-07-17 ENCOUNTER — Other Ambulatory Visit (HOSPITAL_COMMUNITY): Payer: Self-pay

## 2021-07-17 ENCOUNTER — Other Ambulatory Visit: Payer: Self-pay

## 2021-07-17 ENCOUNTER — Telehealth (HOSPITAL_COMMUNITY): Payer: Self-pay | Admitting: Pharmacy Technician

## 2021-07-17 ENCOUNTER — Encounter (HOSPITAL_COMMUNITY): Payer: Self-pay | Admitting: Cardiology

## 2021-07-17 ENCOUNTER — Ambulatory Visit (HOSPITAL_COMMUNITY)
Admission: RE | Admit: 2021-07-17 | Discharge: 2021-07-17 | Disposition: A | Discharge status: Home / Self Care | Payer: Medicaid Other | Source: Ambulatory Visit | Attending: Cardiology | Admitting: Cardiology

## 2021-07-17 VITALS — BP 124/70 | HR 85 | Wt 245.6 lb

## 2021-07-17 DIAGNOSIS — I110 Hypertensive heart disease with heart failure: Secondary | ICD-10-CM | POA: Diagnosis not present

## 2021-07-17 DIAGNOSIS — I428 Other cardiomyopathies: Secondary | ICD-10-CM

## 2021-07-17 DIAGNOSIS — I429 Cardiomyopathy, unspecified: Secondary | ICD-10-CM | POA: Insufficient documentation

## 2021-07-17 DIAGNOSIS — E1142 Type 2 diabetes mellitus with diabetic polyneuropathy: Secondary | ICD-10-CM | POA: Diagnosis not present

## 2021-07-17 DIAGNOSIS — I5042 Chronic combined systolic (congestive) and diastolic (congestive) heart failure: Secondary | ICD-10-CM

## 2021-07-17 DIAGNOSIS — G4733 Obstructive sleep apnea (adult) (pediatric): Secondary | ICD-10-CM | POA: Diagnosis not present

## 2021-07-17 DIAGNOSIS — I4891 Unspecified atrial fibrillation: Secondary | ICD-10-CM

## 2021-07-17 DIAGNOSIS — I2510 Atherosclerotic heart disease of native coronary artery without angina pectoris: Secondary | ICD-10-CM | POA: Insufficient documentation

## 2021-07-17 LAB — BASIC METABOLIC PANEL
Anion gap: 8 (ref 5–15)
BUN: 12 mg/dL (ref 6–20)
CO2: 26 mmol/L (ref 22–32)
Calcium: 9.3 mg/dL (ref 8.9–10.3)
Chloride: 103 mmol/L (ref 98–111)
Creatinine, Ser: 0.86 mg/dL (ref 0.44–1.00)
GFR, Estimated: >60 mL/min (ref >60)
Glucose, Bld: 157 mg/dL — ABNORMAL HIGH (ref 70–99)
Potassium: 3.8 mmol/L (ref 3.5–5.1)
Sodium: 137 mmol/L (ref 135–145)

## 2021-07-17 LAB — ECHOCARDIOGRAM COMPLETE
Area-P 1/2: 8.72 cm2
S' Lateral: 5 cm
Single Plane A4C EF: 26.9 %

## 2021-07-17 LAB — BRAIN NATRIURETIC PEPTIDE: B Natriuretic Peptide: 509.4 pg/mL — ABNORMAL HIGH (ref 0.0–100.0)

## 2021-07-17 MED ORDER — DAPAGLIFLOZIN PROPANEDIOL 10 MG PO TABS: 10.0000 mg | ORAL_TABLET | Freq: Every day | ORAL | 11 refills | Status: DC

## 2021-07-17 MED ORDER — HYDRALAZINE HCL 25 MG PO TABS: 25.0000 mg | ORAL_TABLET | Freq: Three times a day (TID) | ORAL | 11 refills | Status: DC

## 2021-07-17 MED ORDER — POTASSIUM CHLORIDE CRYS ER 20 MEQ PO TBCR: 20.0000 meq | EXTENDED_RELEASE_TABLET | Freq: Every day | ORAL | 3 refills | Status: DC

## 2021-07-17 MED ORDER — FUROSEMIDE 40 MG PO TABS: 40.0000 mg | ORAL_TABLET | Freq: Every day | ORAL | 3 refills | Status: DC

## 2021-07-17 NOTE — Progress Notes (Signed)
  Echocardiogram 2D Echocardiogram has been performed.  [name] 07/17/2021, 10:50 AM

## 2021-07-17 NOTE — Patient Instructions (Addendum)
EKG done today.  Labs done today. We will contact you only if your labs are abnormal.  START Hydralazine 25mg  (1 tablet) by mouth 3 times daily.  START Farxiga 10mg  (1 tablet) by mouth daily.  START Potassium 4meq(1 tablet) by mouth daily.  START Lasix 40mg  (1 tablet) by mouth 2 times daily for 2 days THEN DECREASE to 40mg  (1 tablet) by mouth daily.   No other medication changes were made. Please continue all current medications as prescribed.  Your physician recommends that you schedule a follow-up appointment in: 10 days for a lab only appointment and in 3 weeks with our APP Clinic here in our office  If you have any questions or concerns before your next appointment please send a message through [place] or call our office at [phone].    TO LEAVE A MESSAGE FOR THE NURSE SELECT OPTION 2, PLEASE LEAVE A MESSAGE INCLUDING: YOUR NAME DATE OF BIRTH CALL BACK NUMBER REASON FOR CALL**this is important as we prioritize the call backs  YOU WILL RECEIVE A CALL BACK THE SAME DAY AS LONG AS YOU CALL BEFORE 4:00 PM   Do the following things EVERYDAY: Weigh yourself in the morning before breakfast. Write it down and keep it in a log. Take your medicines as prescribed Eat low salt foods--Limit salt (sodium) to 2000 mg per day.  Stay as active as you can everyday Limit all fluids for the day to less than 2 liters   At the [facility], you and your health needs are our priority. As part of our continuing mission to provide you with exceptional heart care, we have created designated Provider Care Teams. These Care Teams include your primary Cardiologist (physician) and Advanced Practice Providers (APPs- Physician Assistants and Nurse Practitioners) who all work together to provide you with the care you need, when you need it.   You may see any of the following providers on your designated Care Team at your next follow up: Dr Dr [place], NP [place], [phone] Arvilla Meres, PharmD   Please be sure to bring in all your medications bottles to every appointment.

## 2021-07-17 NOTE — Telephone Encounter (Addendum)
Advanced Heart Failure Patient Advocate Encounter  Received notification from Medicaid that prior authorization for Marcelline Deist is required.   PA submitted on [employer] [id number] W Status is pending   Will continue to follow.

## 2021-07-17 NOTE — Progress Notes (Signed)
Patient ID: MKENZIE DOTTS, female   DOB: 03/23/1966, 55 y.o.   MRN: [id number]    Advanced Heart Failure Clinic Note   EP: Dr Caryl Comes PCP: [name], MD HF Cardiologist: Dr Aundra Dubin   HPI: Mrs Lien is a 55 year old with history of morbid obesity, HTN, HLD, CAD, DM2, OSA on CPAP, CVA and chronic systolic CHF in the setting of poorly-controlled HTN. She was admitted in 05/2011 with chest pain and new LBBB and ended up getting a left heart cath, which showed no angiographic CAD. Renal artery dopplers were normal in 04/2012.  She has a Medtronic CRT-D device.   Was hospitalized May 2014 with acute on chronic combined systolic and diastolic CHF. EF from December 2013 was down to 40 to 45%.   Admitted in January 2015 with slurred speech, L arm weakness and facial droop. Initial head CT showed no acute abnormality. Repeat head CT showed subtle gray white matter differentiation in R frontal lobe and R operculum perhaps representing ischemic R sided MCA. STAT repeat CT head later on 1/29 showed further evolution of R MCA territory infarct involving R insular cortex and operculum. No hemorrhage. Neurology recommended CTA head/neck, which showed occlusion of MCA branch to the area of R MCA infarct and a normal neck CTA. TEE was done and did not reveal embolic source. Was discharged to inpatient rehab.  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  She was started on Keppra.  She went to the ER in 2/16 with syncope that was probably due to seizure, no arrhythmias with device interrogation.  Echo in 11/16 showed EF back down to 25-30%.  Echo in 2/18 showed EF back up to 45%.   03/2021 Had device generator change.    Echo was done today and reviewed, EF back down to 25% with mild LV dilation, mildly decreased RV systolic function, dilated IVC.   Today she returns for HF followup. Weight is stable but she has been more short of breath recently.  Since 4/22, she has been dyspneic  walking around her house.  +Orthopnea.  Her left leg has been weak since she had a stroke, and she does not walk much.  She uses her CPAP nightly.   ECG: NSR, BiV paced  Medtronic device interrogation: thoracic impedance trending down with fluid index > threshold. Rare short AF runs.    ECHO 11/2012: 40-45% ECHO  07/2013: 20% RV normal ECHO    08/23/2013: EF 40-45%, diff HK ECHO   09/05/2013: EF 25-30%, diff HK  ECHO  12/11/13: EF 30%, diff HK, RV normal ECHO 01/14/2014: EF 35-40%, LA mod dilated  ECHO 4/16: EF 40-45%, moderate LVH, normal RV size with mildly decreased systolic function.   ECHO 11/16: EF 25-30%, moderate LVH ECHO 2/18: EF 45%, moderate LVH, normal RV size and systolic function.  ECHO 7/22: EF 25%, mild LV dilation, mildly decreased RV systolic function, dilated IVC.   Labs (5/22): K 3.6, creatinine 0.82 Labs (7/22): LDL 112  ROS: All systems negative except as listed in HPI, PMH and Problem List.  Past Medical History:  Diagnosis Date   Allergic rhinitis    Arthritis    "hips, back, legs, arms" (07/04/2014)   Asthma    hx   Automatic implantable cardioverter-defibrillator in situ    Calcifying tendinitis of shoulder    Chronic combined systolic and diastolic CHF (congestive heart failure) (HCC)    EF 40-45% by echo 12/06/2012   Chronic diastolic  heart failure ([place])     Primarily diastolic CHF: Likely due to uncontrolled HTN. Last echo (8/12) with EF 45-50%, mild to moderate LVH with some asymmetric septal hypertrophy, RV normal size and systolic function. EF 50-55% by LV-gram in 6/12.    Chronic lower back pain    secondary to DJD, obsetiy, hip problems. Followed by Dr. Oval Linsey (pain management)   Coronary artery disease    questionable. [place] 05/2011 showing normal coronaries // Followed at [facility], Dr. Aundra Dubin   Degeneration of lumbar or lumbosacral intervertebral disc    DJD (degenerative joint disease) of hip    right sided   Frequent UTI    GERD  (gastroesophageal reflux disease)    HLD (hyperlipidemia)    Hypertension    Poorly controlled. Has had HTN since age 39. Angioedema with ACEI.  24 Hr urine and renal arterial dopplers ordered . . . Never done   LBBB (left bundle branch block)    Liver disease    Morbid obesity ([place])    NICM (nonischemic cardiomyopathy) ([place])    EF 45-50% in 8/12, cath 6/12 showed normal coronaries, EF 50-55% by LV gram   OSA on CPAP    sleep study in 8/12 showed moderate to severe OSA requiring CPAP   Polyneuropathy in diabetes(357.2)    Presence of permanent cardiac pacemaker    Seizures ([place])    last 3 months   Shortness of breath    none now   Stroke [facility]) 12/2013   "my left side is paralyzed" (07/04/2014)   Thoracic or lumbosacral neuritis or radiculitis, unspecified    Type II diabetes mellitus ([place]) DX: 2002    Current Outpatient Medications  Medication Sig Dispense Refill   Accu-Chek FastClix Lancets MISC Check blood sugar 2 times a day 102 each 12   amLODipine (NORVASC) 10 MG tablet Take 1 tablet (10 mg total) by mouth daily. 90 tablet 3   atorvastatin (LIPITOR) 40 MG tablet TAKE ONE TABLET BY MOUTH EVERY DAY 90 tablet 0   Blood Glucose Monitoring Suppl (ACCU-CHEK GUIDE) w/Device KIT 1 each by Does not apply route 3 (three) times daily. 1 kit 1   calamine lotion Apply 1 application topically as needed for itching. 120 mL 0   cyanocobalamin 1000 MCG tablet Take 1 tablet by mouth daily.     cyclobenzaprine (FLEXERIL) 5 MG tablet Take 1 tablet (5 mg total) by mouth 3 (three) times daily as needed for muscle spasms. 30 tablet 0   dapagliflozin propanediol (FARXIGA) 10 MG TABS tablet Take 1 tablet (10 mg total) by mouth daily before breakfast. 30 tablet 11   diclofenac Sodium (VOLTAREN) 1 % GEL APPLY 2 GRAMS TOPICALLY 4 (FOUR) TIMES DAILY. 300 g 1   Dulaglutide (TRULICITY) 9.03 ES/9.2ZR SOPN Inject 0.75 mg into the skin once a week. 5 mL 4   fluticasone (CUTIVATE) 0.05 % cream Apply topically 2  (two) times daily. Dispense two tubes 30 g 6   folic acid (FOLVITE) 1 MG tablet TAKE ONE TABLET BY MOUTH EVERY DAY 90 tablet 1   gabapentin (NEURONTIN) 300 MG capsule Take 300 mg by mouth 2 (two) times daily.     glucose blood (ACCU-CHEK GUIDE) test strip Check blood sugar 2 times per day 100 each 10   hydrALAZINE (APRESOLINE) 25 MG tablet Take 1 tablet (25 mg total) by mouth 3 (three) times daily. 90 tablet 11   isosorbide mononitrate (IMDUR) 60 MG 24 hr tablet Take 1.5 tablets (90 mg  total) by mouth daily. 135 tablet 2   levETIRAcetam (KEPPRA) 500 MG tablet Take 1 tablet (500 mg total) by mouth 2 (two) times daily. 180 tablet 3   linaclotide (LINZESS) 290 MCG CAPS capsule Take 290 mcg by mouth daily as needed (constipation).     metoprolol succinate (TOPROL-XL) 100 MG 24 hr tablet Take 1 tablet (100 mg total) by mouth daily. 30 tablet 3   pantoprazole (PROTONIX) 40 MG tablet Take 1 tablet (40 mg total) by mouth daily. 90 tablet 3   potassium chloride SA (KLOR-CON M20) 20 MEQ tablet Take 1 tablet (20 mEq total) by mouth daily. 90 tablet 3   solifenacin (VESICARE) 10 MG tablet Take 1 tablet (10 mg total) by mouth daily. 30 tablet 5   spironolactone (ALDACTONE) 50 MG tablet Take 1 tablet (50 mg total) by mouth daily. 90 tablet 3   STOOL SOFTENER/LAXATIVE 50-8.6 MG tablet TAKE ONE TABLET BY MOUTH AT BEDTIME AS NEEDED FOR MILD CONSTIPATION 30 tablet 0   Vitamin D, Ergocalciferol, (DRISDOL) 50000 units CAPS capsule TAKE ONE CAPSULE BY MOUTH EVERY MONDAY 4 capsule 0   XARELTO 20 MG TABS tablet TAKE ONE TABLET BY MOUTH EVERY DAY WITH SUPPER 30 tablet 2   furosemide (LASIX) 40 MG tablet Take 1 tablet (40 mg total) by mouth daily. 90 tablet 3   No current facility-administered medications for this encounter.   Vitals:   07/17/21 1117  BP: 124/70  Pulse: 85  SpO2: 95%  Weight: 111.4 kg (245 lb 9.6 oz)    Wt Readings from Last 3 Encounters:  07/17/21 111.4 kg (245 lb 9.6 oz)  07/07/21 111.9 kg  (246 lb 12.8 oz)  04/14/21 111.5 kg (245 lb 12.8 oz)    PHYSICAL EXAM: General: NAD Neck: JVP 10 cm, no thyromegaly or thyroid nodule.  Lungs: Clear to auscultation bilaterally with normal respiratory effort. CV: Nondisplaced PMI.  Heart regular S1/S2, no S3/S4, no murmur.  1+ ankle edema.  No carotid bruit.  Normal pedal pulses.  Abdomen: Soft, nontender, no hepatosplenomegaly, no distention.  Skin: Intact without lesions or rashes.  Neurologic: Alert and oriented x 3.  Psych: Normal affect. Extremities: No clubbing or cyanosis.  HEENT: Normal.   ASSESSMENT/PLAN: 1. Chronic systolic HF: Nonischemic cardiomyopathy, normal coronaries on 6/12 angiography.  Cardiomyopathy may be due to long-standing and poorly-controlled HTN.  Echo 11/16 EF 25-30%, echo 2/18 with EF improved to 45%.  S/P CRT-D Medtronic.  Echo today showed EF back down to 25% with mildly decreased RV systolic function.  She is volume overloaded on exam and by Optivol with NYHA class III symptoms.  - Continue ANTHEM study in the future (poor response to CRT).  - Not on Entresto due to h/o angioedema with ACEi  - Increase Lasix to 40 mg bid x 2 days then 40 mg daily after that.  Add KCl 20 daily.  BMET today and in 10 days.  - Add dapagliflozin 10 mg daily.  - Continue Toprol XL 100 mg daily.  - Continue spironolactone 50 mg daily.  - Continue Imdur 90 mg daily and add hydralazine 25 mg tid (she used to take hydralazine 100 tid).  - Will try her on valsartan eventually (cannot take ACEI or Entresto).  2. HTN: BP controlled.  Can stop amlodipine in the future to allow uptitration of GDMT.  3. Morbid obesity:  Body mass index is 36.27 kg/m. 4. OSA on CPAP:  Continue CPAP hs.  5. Atrial fibrillation: PAF. Patient had a  CVA in the past.  CHADSVASC = 5.  - Continue Xarelto.   6. CVA: Prior CVA with left leg weakness.   Followup in 3 wks with APP.   Loralie Champagne  07/17/2021

## 2021-07-20 ENCOUNTER — Ambulatory Visit (INDEPENDENT_AMBULATORY_CARE_PROVIDER_SITE_OTHER): Payer: Medicaid Other

## 2021-07-20 DIAGNOSIS — I5042 Chronic combined systolic (congestive) and diastolic (congestive) heart failure: Secondary | ICD-10-CM | POA: Diagnosis not present

## 2021-07-20 DIAGNOSIS — Z95810 Presence of automatic (implantable) cardiac defibrillator: Secondary | ICD-10-CM | POA: Diagnosis not present

## 2021-07-20 NOTE — Telephone Encounter (Signed)
Advanced Heart Failure Patient Advocate Encounter  Prior Authorization for Marcelline Deist has been approved.    PA# [id number]  Effective dates: 07/17/2021 - 07/17/2022   Archer Asa, CPhT

## 2021-07-21 NOTE — Progress Notes (Signed)
EPIC Encounter for ICM Monitoring  Patient Name: [name] is a 55 y.o. female Date: 07/21/2021 Primary Care Physican: Merrilyn Puma, MD Primary Cardiologist: Shirlee Latch Electrophysiologist: Joycelyn Schmid Pacing: 98.5%         07/17/2021 Office Weight: 245 lbs 07/21/2021 Weight: 240 lbs           Spoke with patient and heart failure questions reviewed.  Pt reports feeling better since taking extra Furosemide and fluid symptoms have resolved.  Weight drop of 5 lbs. She said she has been busy moving and had a lot going on lately.   Optivol thoracic impedance normal on day of transmission 8/8 but was suggesting possible fluid accumulation from 7/9-8/7.  Per HF 8/5 note Lasix increased to to 40 mg bid x 2 days then 40 mg daily   Prescribed: Furosemide 40 mg take 1 tablet by mouth daily Potassium 20 mEq take 1 tablet by mouth daily. Spironolactone 50 mg take 1 tablet by mouth daily.   Labs: BMET scheduled 07/29/2021 07/17/2021 Creatinine 0.86, BUN 12, Potassium 3.8, Sodium 137, GFR >60 04/14/2021 Creatinine 0.82, BUN <5, Potassium 3.6, Sodium 139, GFR >60  A complete set of results can be found in Results Review.  Recommendations:  No changes and encouraged to call if experiencing any fluid symptoms.  Advised EP scheduler will call reschedule appointment with Dr Graciela Husbands that was missed in July.   Follow-up plan: ICM clinic phone appointment on 08/20/2021.   91 day device clinic remote transmission 09/16/2021.     EP/Cardiology Office Visits: 08/11/2021 with HF clinic PA/NP.   Pt unable to make 06/30/2020 with Dr Graciela Husbands which is 91 day gen change follow up.  Message sent 8/9 to EP scheduler to attempt contact with patient to reschedule Dr Graciela Husbands appt.   Copy of ICM check sent to Dr. Graciela Husbands.    3 month ICM trend: 07/20/2021.    1 Year ICM trend:       [name], RN 07/21/2021 9:23 AM

## 2021-07-28 ENCOUNTER — Other Ambulatory Visit (HOSPITAL_COMMUNITY): Payer: Self-pay | Admitting: Adult Health

## 2021-07-29 ENCOUNTER — Other Ambulatory Visit (HOSPITAL_COMMUNITY): Payer: Medicaid Other

## 2021-08-05 ENCOUNTER — Ambulatory Visit: Payer: Medicaid Other | Admitting: Behavioral Health

## 2021-08-05 DIAGNOSIS — F331 Major depressive disorder, recurrent, moderate: Secondary | ICD-10-CM

## 2021-08-05 DIAGNOSIS — F419 Anxiety disorder, unspecified: Secondary | ICD-10-CM

## 2021-08-05 NOTE — BH Specialist Note (Signed)
Integrated Behavioral Health via Telemedicine Visit  08/05/2021 [name] [id number]  Number of Integrated Behavioral Health visits: 7 Session Start time: 10:30am  Session End time: 11:00am Total time: 30  Referring Provider: Dr. Judeth Cornfield, MD Patient/Family location: Pt is in new residence [facility] Provider location: [facility] Office All persons participating in visit: Pt & Clinician Types of Service: Individual psychotherapy  I connected with [name] and/or [name]'s  self  via  Telephone or [employer]  (Video is Surveyor, mining) and verified that I am speaking with the correct person using two identifiers. Discussed confidentiality:  7th visit  I discussed the limitations of telemedicine and the availability of in person appointments.  Discussed there is a possibility of technology failure and discussed alternative modes of communication if that failure occurs.  I discussed that engaging in this telemedicine visit, they consent to the provision of behavioral healthcare and the services will be billed under their insurance.  Patient and/or legal guardian expressed understanding and consented to Telemedicine visit:  7th visit  Presenting Concerns: Patient and/or family reports the following symptoms/concerns: decreased anx/dep due to lack of proper housing; Pt has secured a 2[address]2 Bath Apt thru a friend. She is happy to have this new residence & is not allowing [name] to move in due to his Tx of her. She is taking care of herself first & "maintaining".  Duration of problem: months; Severity of problem: mild currently-Pt has inc'd resources  Patient and/or Family's Strengths/Protective Factors: Social connections, Social and Emotional competence, Concrete supports in place (healthy food, safe environments, etc.), and Sense of purpose  Goals Addressed: Patient will:  Reduce symptoms of: anxiety and depression   Increase knowledge  and/or ability of: coping skills and stress reduction   Demonstrate ability to: Increase healthy adjustment to current life circumstances as they exist now-adjustment to new Apt & securing items from Storage, incl'g her motorized wheelchair  Progress towards Goals: Ongoing  Interventions: Interventions utilized:  Behavioral Activation and Supportive Counseling Standardized Assessments completed:  screeners prn  Patient and/or Family Response: Pt receptive to call today & requests future ck-in call; knowing she has a contact person if she needs support is helpful.   Assessment: Patient currently experiencing reduced anx/dep due to securing a new Apt. Pt has relied on her faith to sustain her & she is placing her own welfare first now. She cannot be responsible for everyone else. She has been good to others all her life. Pt needs to place her welfare first @ the current time.  Patient may benefit from cont'd ck-in calls for mental health wellness.  Plan: Follow up with behavioral health clinician on : 2-3 wks for 30 min ck-in call Behavioral recommendations: Tend to your own concerns & "maintain" for your own mental health wellness. Referral(s): Integrated [employer] (In Clinic)  I discussed the assessment and treatment plan with the patient and/or parent/guardian. They were provided an opportunity to ask questions and all were answered. They agreed with the plan and demonstrated an understanding of the instructions.   They were advised to call back or seek an in-person evaluation if the symptoms worsen or if the condition fails to improve as anticipated.  Deneise Lever, LMFT

## 2021-08-10 ENCOUNTER — Telehealth (HOSPITAL_COMMUNITY): Payer: Self-pay

## 2021-08-10 NOTE — Telephone Encounter (Signed)
Called to confirm/remind patient of their appointment [facility] on 08[facility]11/21.   Patient reminded to bring all medications and/or complete list.  Confirmed patient has transportation. Gave directions, instructed to utilize valet parking.  Confirmed appointment prior to ending call.   

## 2021-08-11 ENCOUNTER — Encounter (HOSPITAL_COMMUNITY): Payer: Medicaid Other

## 2021-08-11 ENCOUNTER — Ambulatory Visit: Payer: Medicaid Other | Attending: Internal Medicine | Admitting: Physical Therapy

## 2021-08-11 NOTE — Addendum Note (Signed)
Addended by: Neomia Dear on: 08/11/2021 06:17 PM   Modules accepted: Orders

## 2021-08-13 ENCOUNTER — Other Ambulatory Visit: Payer: Self-pay | Admitting: Student

## 2021-08-13 DIAGNOSIS — I4891 Unspecified atrial fibrillation: Secondary | ICD-10-CM

## 2021-08-20 ENCOUNTER — Encounter: Payer: Medicaid Other | Admitting: Internal Medicine

## 2021-08-20 ENCOUNTER — Ambulatory Visit (INDEPENDENT_AMBULATORY_CARE_PROVIDER_SITE_OTHER): Payer: Medicaid Other

## 2021-08-20 ENCOUNTER — Telehealth: Payer: Medicaid Other

## 2021-08-20 DIAGNOSIS — I5042 Chronic combined systolic (congestive) and diastolic (congestive) heart failure: Secondary | ICD-10-CM

## 2021-08-20 DIAGNOSIS — Z95810 Presence of automatic (implantable) cardiac defibrillator: Secondary | ICD-10-CM

## 2021-08-21 ENCOUNTER — Telehealth: Payer: Self-pay

## 2021-08-21 NOTE — Telephone Encounter (Signed)
Remote ICM transmission received.  Attempted call to patient regarding ICM remote transmission and mail box is full. 

## 2021-08-21 NOTE — Progress Notes (Signed)
EPIC Encounter for ICM Monitoring  Patient Name: [name] is a 55 y.o. female Date: 08/21/2021 Primary Care Physican: Merrilyn Puma, MD Primary Cardiologist: Shirlee Latch Electrophysiologist: Joycelyn Schmid Pacing: 98.6%         07/17/2021 Office Weight: 245 lbs 07/21/2021 Weight: 240 lbs           Attempted call to patient and unable to reach. Transmission reviewed.    Optivol thoracic impedance suggesting normal fluid levels.     Prescribed: Furosemide 40 mg take 1 tablet by mouth daily Potassium 20 mEq take 1 tablet by mouth daily. Spironolactone 50 mg take 1 tablet by mouth daily.   Labs:  07/17/2021 Creatinine 0.86, BUN 12, Potassium 3.8, Sodium 137, GFR >60 04/14/2021 Creatinine 0.82, BUN <5, Potassium 3.6, Sodium 139, GFR >60  A complete set of results can be found in Results Review.   Recommendations:  Unable to reach.     Follow-up plan: ICM clinic phone appointment on 09/21/2021.   91 day device clinic remote transmission 09/16/2021.     EP/Cardiology Office Visits: Pt aware to reschedule 91 day gen change OV with Dr Graciela Husbands since she was unable to go to the visit in July.   Copy of ICM check sent to Dr. Graciela Husbands.    3 month ICM trend: 08/20/2021.    1 Year ICM trend:       [name], RN 08/21/2021 2:16 PM

## 2021-08-25 ENCOUNTER — Ambulatory Visit: Payer: Medicaid Other | Admitting: Licensed Clinical Social Worker

## 2021-08-25 NOTE — Patient Instructions (Signed)
Visit Information  Instructions: patient will work with SW to address concerns related to housing.  Patient was given the following information about care management and care coordination services today, agreed to services, and gave verbal consent: 1.care management/care coordination services include personalized support from designated clinical staff supervised by their physician, including individualized plan of care and coordination with other care providers 2. 24/7 contact phone numbers for assistance for urgent and routine care needs. 3. The patient may stop care management/care coordination services at any time by phone call to the office staff.  Patient verbalizes understanding of instructions provided today and agrees to view in MyChart.   The care management team will reach out to the patient again over the next 30 days.  Dezyrae Kensinger, BSW  Social Worker [facility] Care Management  [phone]      

## 2021-08-25 NOTE — Chronic Care Management (AMB) (Signed)
  Care Management   Social Work Visit Note  08/25/2021 Name: [name] MRN: [id number] DOB: 06-25-1966  [name] is a 55 y.o. year old female who sees Merrilyn Puma, MD for primary care. The care management team was consulted for assistance with care management and care coordination needs related to [facility] Resources    Patient was given the following information about care management and care coordination services today, agreed to services, and gave verbal consent: 1.care management/care coordination services include personalized support from designated clinical staff supervised by their physician, including individualized plan of care and coordination with other care providers 2. 24/7 contact phone numbers for assistance for urgent and routine care needs. 3. The patient may stop care management/care coordination services at any time by phone call to the office staff.  Engaged with patient by telephone for follow up visit in response to provider referral for social work chronic care management and care coordination services.  Assessment: Review of patient history, allergies, and health status during evaluation of patient need for care management/care coordination services.    Interventions:  Patient interviewed and appropriate assessments performed Collaborated with clinical team regarding patient needs  Patient discussed feeling frustrated regarding beingin a wheel chair and having cut on her leg. Patient stated she spoke with her PCP and addressed the concerns. Patient stated no other needs or concerns.  Patient stated she enjoys calls with SW and requested calls to continue.   SDOH (Social Determinants of Health) assessments performed: No     Plan:  patient will work with BSW to address needs related to Housing barriers Child psychotherapist will follow up within 30-days.   Christen Butter, BSW  Social Worker [facility] Management  [phone]

## 2021-08-31 ENCOUNTER — Ambulatory Visit (INDEPENDENT_AMBULATORY_CARE_PROVIDER_SITE_OTHER): Payer: Medicaid Other

## 2021-08-31 DIAGNOSIS — I428 Other cardiomyopathies: Secondary | ICD-10-CM

## 2021-09-01 ENCOUNTER — Ambulatory Visit: Payer: Medicaid Other | Admitting: Behavioral Health

## 2021-09-02 LAB — CUP PACEART REMOTE DEVICE CHECK
Battery Remaining Longevity: 54 mo
Battery Voltage: 2.98 V
Brady Statistic AP VP Percent: 0 %
Brady Statistic AP VS Percent: 0.03 %
Brady Statistic AS VP Percent: 98.51 %
Brady Statistic AS VS Percent: 1.46 %
Brady Statistic RA Percent Paced: 0.03 %
Brady Statistic RV Percent Paced: 14.51 %
Date Time Interrogation Session: 20220917022722
HighPow Impedance: 55 Ohm
Implantable Lead Implant Date: 20140910
Implantable Lead Implant Date: 20140910
Implantable Lead Implant Date: 20140910
Implantable Lead Location: 753858
Implantable Lead Location: 753859
Implantable Lead Location: 753860
Implantable Lead Model: 4298
Implantable Lead Model: 5076
Implantable Lead Model: 6935
Implantable Pulse Generator Implant Date: 20220406
Lead Channel Impedance Value: 171 Ohm
Lead Channel Impedance Value: 180 Ohm
Lead Channel Impedance Value: 180 Ohm
Lead Channel Impedance Value: 180 Ohm
Lead Channel Impedance Value: 190 Ohm
Lead Channel Impedance Value: 342 Ohm
Lead Channel Impedance Value: 342 Ohm
Lead Channel Impedance Value: 380 Ohm
Lead Channel Impedance Value: 380 Ohm
Lead Channel Impedance Value: 380 Ohm
Lead Channel Impedance Value: 399 Ohm
Lead Channel Impedance Value: 437 Ohm
Lead Channel Impedance Value: 456 Ohm
Lead Channel Impedance Value: 627 Ohm
Lead Channel Impedance Value: 627 Ohm
Lead Channel Impedance Value: 627 Ohm
Lead Channel Impedance Value: 646 Ohm
Lead Channel Impedance Value: 646 Ohm
Lead Channel Pacing Threshold Amplitude: 0.375 V
Lead Channel Pacing Threshold Amplitude: 0.5 V
Lead Channel Pacing Threshold Amplitude: 1 V
Lead Channel Pacing Threshold Pulse Width: 0.4 ms
Lead Channel Pacing Threshold Pulse Width: 0.4 ms
Lead Channel Pacing Threshold Pulse Width: 0.4 ms
Lead Channel Sensing Intrinsic Amplitude: 17.375 mV
Lead Channel Sensing Intrinsic Amplitude: 17.375 mV
Lead Channel Sensing Intrinsic Amplitude: 4.25 mV
Lead Channel Sensing Intrinsic Amplitude: 4.25 mV
Lead Channel Setting Pacing Amplitude: 1.5 V
Lead Channel Setting Pacing Amplitude: 2 V
Lead Channel Setting Pacing Amplitude: 4 V
Lead Channel Setting Pacing Pulse Width: 0.4 ms
Lead Channel Setting Pacing Pulse Width: 0.4 ms
Lead Channel Setting Sensing Sensitivity: 0.3 mV

## 2021-09-07 NOTE — Progress Notes (Signed)
Remote ICD transmission.   

## 2021-09-09 ENCOUNTER — Telehealth (HOSPITAL_COMMUNITY): Payer: Self-pay

## 2021-09-09 NOTE — Telephone Encounter (Signed)
Called to confirm/remind patient of their appointment at the [facility] on 09/10/21.   Patient reminded to bring all medications and/or complete list.  Confirmed patient has transportation. Gave directions, instructed to utilize valet parking.  Confirmed appointment prior to ending call.   

## 2021-09-10 ENCOUNTER — Encounter (HOSPITAL_COMMUNITY): Payer: Medicaid Other

## 2021-09-15 ENCOUNTER — Other Ambulatory Visit: Payer: Self-pay | Admitting: Student

## 2021-09-15 DIAGNOSIS — N3946 Mixed incontinence: Secondary | ICD-10-CM

## 2021-09-17 ENCOUNTER — Ambulatory Visit: Payer: Medicaid Other | Admitting: Behavioral Health

## 2021-09-17 DIAGNOSIS — F419 Anxiety disorder, unspecified: Secondary | ICD-10-CM

## 2021-09-17 DIAGNOSIS — F331 Major depressive disorder, recurrent, moderate: Secondary | ICD-10-CM

## 2021-09-17 NOTE — BH Specialist Note (Signed)
Integrated Behavioral Health via Telemedicine Visit  09/17/2021 [name] [id number]  Number of Integrated Behavioral Health visits: 8 Session Start time: 10:40am  Session End time: 11:00am Total time: 20  Referring Provider: Dr. Brita Romp, MD Patient/Family location: Pt is in new Apt in private [facility] Provider location: [facility] Office All persons participating in visit: Pt & Clinician Types of Service: Individual psychotherapy  I connected with [name] and/or [name]'s  self  via  Telephone or [employer]  (Video is [employer], [employer]) and verified that I am speaking with the correct person using two identifiers. Discussed confidentiality:  8th visit  I discussed the limitations of telemedicine and the availability of in person appointments.  Discussed there is a possibility of technology failure and discussed alternative modes of communication if that failure occurs.  I discussed that engaging in this telemedicine visit, they consent to the provision of behavioral healthcare and the services will be billed under their insurance.  Patient and/or legal guardian expressed understanding and consented to Telemedicine visit:  8th visit  Presenting Concerns: Patient and/or family reports the following symptoms/concerns: elevated anx/dep due to lack of assistance from others she has always helped. She speaks to [name] daily who narrates his own concerns. She listens & he talks. Duration of problem: yrs; Severity of problem: mild  Patient and/or Family's Strengths/Protective Factors: Social and Emotional competence, Concrete supports in place (healthy food, safe environments, etc.), and Sense of purpose  Goals Addressed: Patient will:  Reduce symptoms of: anxiety, depression, and stress   Increase knowledge and/or ability of: coping skills and stress reduction   Demonstrate ability to: Increase healthy adjustment to current life  circumstances  Progress towards Goals: Ongoing  Interventions: Interventions utilized:  Supportive Counseling Standardized Assessments completed:  screeners prn  Patient and/or Family Response: Pt is receptive to call & requests future ck-in appts  Assessment: Patient currently experiencing elevated anx/dep due to ppl depending on her she cannot serve any longer. Pt is always listener, not able to talk to others in her life who will listen. She loses her patience.  Patient may benefit from cont'd sessions for ck-ins..  Plan: Follow up with behavioral health clinician on : 2-3 wks on telehealth for 30 min Behavioral recommendations: None @ this time Referral(s): Integrated [employer] (In Clinic)  I discussed the assessment and treatment plan with the patient and/or parent/guardian. They were provided an opportunity to ask questions and all were answered. They agreed with the plan and demonstrated an understanding of the instructions.   They were advised to call back or seek an in-person evaluation if the symptoms worsen or if the condition fails to improve as anticipated.  Deneise Lever, LMFT

## 2021-09-21 ENCOUNTER — Ambulatory Visit (INDEPENDENT_AMBULATORY_CARE_PROVIDER_SITE_OTHER): Payer: Medicaid Other

## 2021-09-21 DIAGNOSIS — Z95810 Presence of automatic (implantable) cardiac defibrillator: Secondary | ICD-10-CM | POA: Diagnosis not present

## 2021-09-21 DIAGNOSIS — I5042 Chronic combined systolic (congestive) and diastolic (congestive) heart failure: Secondary | ICD-10-CM | POA: Diagnosis not present

## 2021-09-23 ENCOUNTER — Ambulatory Visit: Payer: Medicaid Other | Admitting: Licensed Clinical Social Worker

## 2021-09-23 NOTE — Chronic Care Management (AMB) (Signed)
  Care Management   Social Work Visit Note  09/23/2021 Name: [name] MRN: [id number] DOB: 12-19-1965  [name] is a 55 y.o. year old female who sees Merrilyn Puma, MD for primary care. The care management team was consulted for assistance with care management and care coordination needs related to [facility] Resources    Patient was given the following information about care management and care coordination services today, agreed to services, and gave verbal consent: 1.care management/care coordination services include personalized support from designated clinical staff supervised by their physician, including individualized plan of care and coordination with other care providers 2. 24/7 contact phone numbers for assistance for urgent and routine care needs. 3. The patient may stop care management/care coordination services at any time by phone call to the office staff.  Engaged with patient by telephone for follow up visit in response to provider referral for social work chronic care management and care coordination services.  Assessment: Review of patient history, allergies, and health status during evaluation of patient need for care management/care coordination services.    Interventions:  Patient interviewed and appropriate assessments performed Collaborated with clinical team regarding patient needs  Patient advised she is having pain in her leg. Patient requested an appointment with PCP. SW sent inbasket message to front desk, nurse triage and RNCM.  Patient had a positive tone to her voice. Patient expressed gratification for SW calling to check on her.  Patient advised of no other concerns.   SDOH (Social Determinants of Health) assessments performed: No     Plan:   Social Worker will follow up within 30-days.   Christen Butter, BSW  Social Worker [facility]  [phone]

## 2021-09-23 NOTE — Patient Instructions (Signed)
Visit Information  Instructions: patient will work with SW to address concerns related to level of care concerns.  Patient was given the following information about care management and care coordination services today, agreed to services, and gave verbal consent: 1.care management/care coordination services include personalized support from designated clinical staff supervised by their physician, including individualized plan of care and coordination with other care providers 2. 24/7 contact phone numbers for assistance for urgent and routine care needs. 3. The patient may stop care management/care coordination services at any time by phone call to the office staff.  Patient verbalizes understanding of instructions provided today and agrees to view in MyChart.   The care management team will reach out to the patient again over the next 30 days.   [name], BSW  Social Worker [facility] Care [facility]  [phone]      

## 2021-09-24 ENCOUNTER — Other Ambulatory Visit: Payer: Self-pay

## 2021-09-24 ENCOUNTER — Encounter: Payer: Self-pay | Admitting: Student

## 2021-09-24 ENCOUNTER — Ambulatory Visit: Payer: Medicaid Other | Admitting: Student

## 2021-09-24 VITALS — BP 140/86 | HR 117 | Temp 98.2°F | Resp 32 | Ht 69.0 in | Wt 250.0 lb

## 2021-09-24 DIAGNOSIS — I69354 Hemiplegia and hemiparesis following cerebral infarction affecting left non-dominant side: Secondary | ICD-10-CM | POA: Diagnosis not present

## 2021-09-24 DIAGNOSIS — Z79891 Long term (current) use of opiate analgesic: Secondary | ICD-10-CM

## 2021-09-24 DIAGNOSIS — E1140 Type 2 diabetes mellitus with diabetic neuropathy, unspecified: Secondary | ICD-10-CM

## 2021-09-24 DIAGNOSIS — E1142 Type 2 diabetes mellitus with diabetic polyneuropathy: Secondary | ICD-10-CM

## 2021-09-24 DIAGNOSIS — M1712 Unilateral primary osteoarthritis, left knee: Secondary | ICD-10-CM

## 2021-09-24 DIAGNOSIS — E1165 Type 2 diabetes mellitus with hyperglycemia: Secondary | ICD-10-CM | POA: Diagnosis not present

## 2021-09-24 DIAGNOSIS — M5416 Radiculopathy, lumbar region: Secondary | ICD-10-CM

## 2021-09-24 DIAGNOSIS — G8114 Spastic hemiplegia affecting left nondominant side: Secondary | ICD-10-CM

## 2021-09-24 LAB — POCT GLYCOSYLATED HEMOGLOBIN (HGB A1C): Hemoglobin A1C: 9.5 % — AB (ref 4.0–5.6)

## 2021-09-24 LAB — GLUCOSE, CAPILLARY: Glucose-Capillary: 326 mg/dL — ABNORMAL HIGH (ref 70–99)

## 2021-09-24 MED ORDER — DICLOFENAC SODIUM 1 % EX GEL: 4.0000 g | Freq: Four times a day (QID) | CUTANEOUS | 2 refills | Status: DC

## 2021-09-24 MED ORDER — TRULICITY 1.5 MG/0.5ML ~~LOC~~ SOAJ: 1.5000 mg | SUBCUTANEOUS | 1 refills | Status: DC

## 2021-09-24 NOTE — Assessment & Plan Note (Signed)
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 She was recently established at a pain clinic here in [place], but would prefer to switch to a different clinic for pain control. I have placed a referral to pain clinic in anticipation to switch her to a different clinic.  Plan: -referral to different pain clinic

## 2021-09-24 NOTE — Assessment & Plan Note (Signed)
Patient requesting a new bedside commode as her previous one has broken. I do believe this has been beneficial for her given her spastic hemiparesis. Reordered a new bedside commode.

## 2021-09-24 NOTE — Patient Instructions (Signed)
Ms. Bassette,  It was a pleasure seeing you in the clinic today.   Your A1c has gone up some since your last visit. We will increase your trulicity dose to help keep your sugars better controlled. This was sent to [facility]. I have ordered a bedside commode for you. I have placed a referral to another pain clinic for you. Please come back in 3 months for follow up.  Please call our clinic at [phone] if you have any questions or concerns. The best time to call is Monday-Friday from 9am-4pm, but there is someone available 24/7 at the same number. If you need medication refills, please notify your pharmacy one week in advance and they will send [place] a request.   Thank you for letting us take part in your care. We look forward to seeing you next time!

## 2021-09-24 NOTE — Assessment & Plan Note (Signed)
Patient with history of uncontrolled T2DM with neuropathy. She is currently on farxiga 10mg  daily along with trulicity 0.75mg  weekly. Last A1c was actually improved at 7.4%. Unfortunately, repeat A1c today at 9.5%.  Given that patient has multiple comorbidities, it is appropriate to increase her goal A1c to < 8%. I believe that very aggressive glycemic control will actually be more harmful for patient as she has trouble with injections given left sided spastic hemiparesis. was able to provide diabetic education and notes that patient is able to appropriately self-inject her trulicity doses. Therefore, instead of adding lantus to regimen, will increase trulicity dose to 1.5mg  weekly for convenience. Also, advised patient to check blood sugar levels less frequently so she does not have to stick herself as often.   Plan: -increased trulicity to 1.5mg  weekly -continue farxiga -repeat A1c in 3 months with goal < 8%

## 2021-09-24 NOTE — Progress Notes (Signed)
CC: f/u of T2DM  HPI:  [name] is a 55 y.o. female with history listed below presenting to the [facility] for f/u of T2DM and other chronic medical conditions. Please see individualized problem based charting for full HPI.  Past Medical History:  Diagnosis Date   Allergic rhinitis    Arthritis    "hips, back, legs, arms" (07/04/2014)   Asthma    hx   Automatic implantable cardioverter-defibrillator in situ    Calcifying tendinitis of shoulder    Chronic combined systolic and diastolic CHF (congestive heart failure) (HCC)    EF 40-45% by echo 12/06/2012   Chronic diastolic heart failure (HCC)     Primarily diastolic CHF: Likely due to uncontrolled HTN. Last echo (8/12) with EF 45-50%, mild to moderate LVH with some asymmetric septal hypertrophy, RV normal size and systolic function. EF 50-55% by LV-gram in 6/12.    Chronic lower back pain    secondary to DJD, obsetiy, hip problems. Followed by Dr. Ivory Broad (pain management)   Coronary artery disease    questionable. LHC 05/2011 showing normal coronaries // Followed at [facility], Dr. 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 Has had HTN since age 25. Angioedema with ACEI.  24 Hr urine and renal arterial dopplers ordered . . . Never done   LBBB (left bundle branch block)    Liver disease    Morbid obesity (HCC)    NICM (nonischemic cardiomyopathy) (HCC)    EF 45-50% in 8/12, cath 6/12 showed normal coronaries, EF 50-55% by LV gram   OSA on CPAP    sleep study in 8/12 showed moderate to severe OSA requiring CPAP   Polyneuropathy in diabetes(357.2)    Presence of permanent cardiac pacemaker    Seizures (HCC)    last 3 months   Shortness of breath    none now   Stroke [facility]) 12/2013   "my left side is paralyzed"  (07/04/2014)   Thoracic or lumbosacral neuritis or radiculitis, unspecified    Type II diabetes mellitus (HCC) DX: 2002    Review of Systems:  Negative aside from that listed in individualized problem based charting.  Physical Exam:  Vitals:   09/24/21 1537  BP: 140/86  Pulse: (!) 117  Resp: (!) 32  Temp: 98.2 F (36.8 C)  TempSrc: Oral  SpO2: 97%  Weight: 250 lb (113.4 kg)  Height: 5\' 9"  (1.753 m)   Physical Exam Constitutional:      Appearance: She is obese.  HENT:     Mouth/Throat:     Mouth: Mucous membranes are moist.     Pharynx: Oropharynx is clear. No oropharyngeal exudate.  Eyes:     Extraocular Movements: Extraocular movements intact.     Conjunctiva/sclera: Conjunctivae normal.     Pupils: Pupils are equal, round, and reactive to light.  Cardiovascular:     Rate and Rhythm: Regular rhythm. Tachycardia present.     Pulses: Normal pulses.  Pulmonary:     Effort: Pulmonary effort is normal.     Breath sounds: Normal breath sounds. No wheezing, rhonchi or rales.  Abdominal:     General: Bowel sounds are normal. There is no distension.     Palpations: Abdomen is soft.  Tenderness: There is no abdominal tenderness.  Skin:    General: Skin is warm and dry.  Neurological:     Mental Status: She is alert and oriented to person, place, and time. Mental status is at baseline.     Comments: Spastic left hemiparesis  Psychiatric:        Mood and Affect: Mood normal.        Behavior: Behavior normal.     Assessment & Plan:   See Encounters Tab for problem based charting.  Patient discussed with Dr. Oswaldo Done

## 2021-09-25 ENCOUNTER — Telehealth: Payer: Self-pay

## 2021-09-25 NOTE — Telephone Encounter (Signed)
Remote ICM transmission received.  Attempted call to patient regarding ICM remote transmission and mail box is full. 

## 2021-09-25 NOTE — Progress Notes (Signed)
EPIC Encounter for ICM Monitoring  Patient Name: [name] is a 55 y.o. female Date: 09/25/2021 Primary Care Physican: Merrilyn Puma, MD Primary Cardiologist: Shirlee Latch Electrophysiologist: Joycelyn Schmid Pacing: 98.6%         09/24/2021 Office Weight: 250 lbs           Attempted call to patient and unable to reach.  Transmission reviewed.    Optivol thoracic impedance suggesting normal fluid levels.     Prescribed: Furosemide 40 mg take 1 tablet by mouth daily Potassium 20 mEq take 1 tablet by mouth daily. Spironolactone 50 mg take 1 tablet by mouth daily.   Labs:  07/17/2021 Creatinine 0.86, BUN 12, Potassium 3.8, Sodium 137, GFR >60 04/14/2021 Creatinine 0.82, BUN <5, Potassium 3.6, Sodium 139, GFR >60  A complete set of results can be found in Results Review.   Recommendations:  Unable to reach.     Follow-up plan: ICM clinic phone appointment on 10/26/2021.   91 day device clinic remote transmission 11/30/2021.     EP/Cardiology Office Visits:  10/07/2021 with Dr Graciela Husbands.   Copy of ICM check sent to Dr. Graciela Husbands.     3 month ICM trend: 09/21/2021.    1 Year ICM trend:       [name], RN 09/25/2021 9:52 AM

## 2021-09-25 NOTE — Progress Notes (Signed)
Internal Medicine Clinic Attending  Case discussed with Dr. Jinwala  At the time of the visit.  We reviewed the resident's history and exam and pertinent patient test results.  I agree with the assessment, diagnosis, and plan of care documented in the resident's note.  

## 2021-10-01 ENCOUNTER — Telehealth: Payer: Self-pay

## 2021-10-01 ENCOUNTER — Other Ambulatory Visit: Payer: Self-pay | Admitting: Internal Medicine

## 2021-10-01 DIAGNOSIS — I69359 Hemiplegia and hemiparesis following cerebral infarction affecting unspecified side: Secondary | ICD-10-CM

## 2021-10-01 NOTE — Telephone Encounter (Signed)
PA came through for PT ( TRULICITY 1.5 MG ) was filled and signed by PCP and sent to [facility]  awaiting approval or denial

## 2021-10-07 ENCOUNTER — Encounter: Payer: Medicaid Other | Admitting: Internal Medicine

## 2021-10-07 ENCOUNTER — Ambulatory Visit: Payer: Medicaid Other | Admitting: Behavioral Health

## 2021-10-07 ENCOUNTER — Other Ambulatory Visit: Payer: Self-pay

## 2021-10-07 DIAGNOSIS — I428 Other cardiomyopathies: Secondary | ICD-10-CM

## 2021-10-07 DIAGNOSIS — I69359 Hemiplegia and hemiparesis following cerebral infarction affecting unspecified side: Secondary | ICD-10-CM

## 2021-10-07 DIAGNOSIS — I5042 Chronic combined systolic (congestive) and diastolic (congestive) heart failure: Secondary | ICD-10-CM

## 2021-10-07 DIAGNOSIS — F331 Major depressive disorder, recurrent, moderate: Secondary | ICD-10-CM

## 2021-10-07 DIAGNOSIS — I4891 Unspecified atrial fibrillation: Secondary | ICD-10-CM

## 2021-10-07 DIAGNOSIS — Z95810 Presence of automatic (implantable) cardiac defibrillator: Secondary | ICD-10-CM

## 2021-10-07 DIAGNOSIS — F419 Anxiety disorder, unspecified: Secondary | ICD-10-CM

## 2021-10-07 DIAGNOSIS — Z79891 Long term (current) use of opiate analgesic: Secondary | ICD-10-CM

## 2021-10-07 MED ORDER — CYCLOBENZAPRINE HCL 5 MG PO TABS: 5.0000 mg | ORAL_TABLET | Freq: Three times a day (TID) | ORAL | 0 refills | Status: DC | PRN

## 2021-10-07 MED ORDER — GABAPENTIN 300 MG PO CAPS: 300.0000 mg | ORAL_CAPSULE | Freq: Two times a day (BID) | ORAL | 1 refills | Status: DC

## 2021-10-07 NOTE — Telephone Encounter (Signed)
cyclobenzaprine (FLEXERIL) 5 MG tablet  gabapentin (NEURONTIN) 300 MG capsule, REFILL REQUEST @ [facility] [address], [place] - [address]

## 2021-10-07 NOTE — BH Specialist Note (Signed)
Integrated Behavioral Health via Telemedicine Visit  10/07/2021 ASHARA LOUNSBURY [id number]  Number of Integrated Behavioral Health visits: 9 Session Start time: 1:30pm  Session End time: 2:00pm Total time: 30  Referring Provider: Dr. Brita Romp, MD Patient/Family location: Pt is in her car @ [facility] [employer] [facility] Provider location: [facility] Office All persons participating in visit: Pt & Clinician Types of Service: Individual psychotherapy  I connected with [name] and/or Luster Landsberg Pizzuto's  self  via  Telephone or [employer]  (Video is Surveyor, mining) and verified that I am speaking with the correct person using two identifiers. Discussed confidentiality:  9th visit  I discussed the limitations of telemedicine and the availability of in person appointments.  Discussed there is a possibility of technology failure and discussed alternative modes of communication if that failure occurs.  I discussed that engaging in this telemedicine visit, they consent to the provision of behavioral healthcare and the services will be billed under their insurance.  Patient and/or legal guardian expressed understanding and consented to Telemedicine visit:  9th visit  Presenting Concerns: Patient and/or family reports the following symptoms/concerns: reduced worry & stress for [name]' situation who is staying shelter.  Pt is concerned for situation Family. Pt is "maintaining"  Duration of problem: all year; Severity of problem: moderate  Patient and/or Family's Strengths/Protective Factors: Social and Emotional competence, Concrete supports in place (healthy food, safe environments, etc.), and Sense of purpose  Goals Addressed: Patient will:  Reduce symptoms of: anxiety, depression, and stress   Increase knowledge and/or ability of: coping skills and stress reduction   Demonstrate ability to: Increase healthy adjustment to current life circumstances  Progress  towards Goals: Ongoing  Interventions: Interventions utilized:  Supportive Counseling Standardized Assessments completed:  screeners prn  Patient and/or Family Response: PT receptive to call & requests future appt  Assessment: Patient currently experiencing inc'd density of ppl in Pt's new home. Pt + 4 Adults & 4 Children. This has been the circumstances since past Sunday.  Pt is looking for new place & Dtr is also looking. Family is pooling their resources.  Patient may benefit from cont'd support & encouragement.  Plan: Follow up with behavioral health clinician on : 2-3 wks on telehealth for 30 min Behavioral recommendations: None @ this time; cont your positive attitude & being patient Referral(s): Integrated [employer] (In Clinic)  I discussed the assessment and treatment plan with the patient and/or parent/guardian. They were provided an opportunity to ask questions and all were answered. They agreed with the plan and demonstrated an understanding of the instructions.   They were advised to call back or seek an in-person evaluation if the symptoms worsen or if the condition fails to improve as anticipated.  Deneise Lever, LMFT

## 2021-10-28 ENCOUNTER — Ambulatory Visit: Payer: Medicaid Other | Admitting: Behavioral Health

## 2021-10-28 NOTE — Progress Notes (Signed)
No ICM remote transmission received for 10/26/2021 and next ICM transmission scheduled for 11/23/2021.    

## 2021-10-29 ENCOUNTER — Other Ambulatory Visit (HOSPITAL_COMMUNITY): Payer: Self-pay | Admitting: Cardiology

## 2021-10-29 ENCOUNTER — Other Ambulatory Visit: Payer: Self-pay | Admitting: Student

## 2021-10-29 DIAGNOSIS — I4891 Unspecified atrial fibrillation: Secondary | ICD-10-CM

## 2021-11-11 ENCOUNTER — Telehealth: Payer: Self-pay

## 2021-11-16 ENCOUNTER — Other Ambulatory Visit: Payer: Self-pay | Admitting: Student

## 2021-11-16 DIAGNOSIS — M1712 Unilateral primary osteoarthritis, left knee: Secondary | ICD-10-CM

## 2021-11-17 ENCOUNTER — Encounter: Payer: Self-pay | Admitting: Internal Medicine

## 2021-11-17 ENCOUNTER — Other Ambulatory Visit: Payer: Self-pay

## 2021-11-17 ENCOUNTER — Other Ambulatory Visit: Payer: Self-pay | Admitting: Student

## 2021-11-17 ENCOUNTER — Ambulatory Visit (INDEPENDENT_AMBULATORY_CARE_PROVIDER_SITE_OTHER): Payer: Medicaid Other | Admitting: Internal Medicine

## 2021-11-17 VITALS — Temp 97.5°F | Resp 28 | Ht 69.0 in | Wt 247.2 lb

## 2021-11-17 DIAGNOSIS — I10 Essential (primary) hypertension: Secondary | ICD-10-CM

## 2021-11-17 DIAGNOSIS — E1140 Type 2 diabetes mellitus with diabetic neuropathy, unspecified: Secondary | ICD-10-CM | POA: Diagnosis not present

## 2021-11-17 DIAGNOSIS — E1142 Type 2 diabetes mellitus with diabetic polyneuropathy: Secondary | ICD-10-CM | POA: Diagnosis not present

## 2021-11-17 DIAGNOSIS — I69354 Hemiplegia and hemiparesis following cerebral infarction affecting left non-dominant side: Secondary | ICD-10-CM | POA: Diagnosis not present

## 2021-11-17 DIAGNOSIS — Z1231 Encounter for screening mammogram for malignant neoplasm of breast: Secondary | ICD-10-CM

## 2021-11-17 DIAGNOSIS — G4733 Obstructive sleep apnea (adult) (pediatric): Secondary | ICD-10-CM

## 2021-11-17 DIAGNOSIS — E785 Hyperlipidemia, unspecified: Secondary | ICD-10-CM

## 2021-11-17 DIAGNOSIS — E1165 Type 2 diabetes mellitus with hyperglycemia: Secondary | ICD-10-CM | POA: Diagnosis not present

## 2021-11-17 DIAGNOSIS — I69359 Hemiplegia and hemiparesis following cerebral infarction affecting unspecified side: Secondary | ICD-10-CM | POA: Insufficient documentation

## 2021-11-17 DIAGNOSIS — Z0000 Encounter for general adult medical examination without abnormal findings: Secondary | ICD-10-CM

## 2021-11-17 DIAGNOSIS — E559 Vitamin D deficiency, unspecified: Secondary | ICD-10-CM

## 2021-11-17 DIAGNOSIS — Z23 Encounter for immunization: Secondary | ICD-10-CM

## 2021-11-17 MED ORDER — TRULICITY 1.5 MG/0.5ML ~~LOC~~ SOAJ: 1.5000 mg | SUBCUTANEOUS | 1 refills | Status: DC

## 2021-11-17 MED ORDER — LINACLOTIDE 290 MCG PO CAPS: 290.0000 ug | ORAL_CAPSULE | Freq: Every day | ORAL | 2 refills | Status: DC | PRN

## 2021-11-17 MED ORDER — ATORVASTATIN CALCIUM 40 MG PO TABS: 40.0000 mg | ORAL_TABLET | Freq: Every day | ORAL | 2 refills | Status: DC

## 2021-11-17 MED ORDER — CYCLOBENZAPRINE HCL 5 MG PO TABS: 5.0000 mg | ORAL_TABLET | Freq: Three times a day (TID) | ORAL | 0 refills | Status: DC | PRN

## 2021-11-17 NOTE — Assessment & Plan Note (Addendum)
Assessment: Patient reports history of vitamin D deficiency and supplementation however last level that I can see checked was in 2015. She has not taken supplementation for quite some time now. Plan: Check vitamin D level. Will send in supplementation based on lab results.  Addendum: Vitamin D level resulted at 19.9.  We will send in vitamin D 800 units daily and plan to recheck level in 3 months.

## 2021-11-17 NOTE — Assessment & Plan Note (Addendum)
>>  ASSESSMENT AND PLAN FOR POORLY CONTROLLED TYPE 2 DIABETES MELLITUS WITH NEUROPATHY (HCC) WRITTEN ON 11/17/2021  4:32 PM BY August Saucer, Casi Westerfeld, DO  Assessment: Most recent HbA1c greatly increased in October to 9.5% from July when it was 7.4%. BMP was WNL at that time. Patient is not checking sugars at home at this time. She denies any episodes of symptomatic hypoglycemia. Plan: Continue Trulicity 1.5 mg weekly, Farxiga 10 mg daily. Recheck HbA1c, BMP at follow up visit in 1 month.  >>ASSESSMENT AND PLAN FOR DIABETIC PERIPHERAL NEUROPATHY (HCC) WRITTEN ON 11/17/2021  4:30 PM BY August Saucer, Keara Pagliarulo, DO  Assessment: Patient reports persistent neuropathy. She is well managed with gabapentin 300 mg BID, flexeril 5 mg TID PRN. Plan: Continue gabapentin 300 mg BID, flexeril 5 mg TID PRN (refilled today).

## 2021-11-17 NOTE — Assessment & Plan Note (Signed)
Assessment: Patient reports that she has not been taking atorvastatin 40 mg daily for quite some time now. Lipid panel in July 2022 was remarkable for LDL 112; goal LDL <70. Plan: Will restart patient on atorvastatin 40 mg daily.

## 2021-11-17 NOTE — Patient Instructions (Signed)
Thank you for visiting the Internal Medicine Clinic today. It was a pleasure to meet you! Today we discussed:  Blood pressure: Your blood pressure looked great today! Please continue with your current medications.  Sleep apnea: You raised concerns regarding the fit of your CPAP. Please contact the doctor who ordered these supplies for you so that they can help you arrange with the company to have the fit rechecked.  Diabetes with neuropathy: You are not due for HbA1c check today. We will recheck this at your next visit. Please continue with your current medications.  High cholesterol: Your cholesterol levels were high when they were checked in July however you have not been taking cholesterol medication for this. I am going to send in a medication for you to restart called atorvastatin.  Vitamin D deficiency: The last time this level was checked was in 2015. You have previously been on supplementation for this. I would like to check your current level today before sending in medication.  Stroke: You are continuing to work towards increasing your strength that you lost with your stroke but have continued to have some falls. I will inquire about getting physical therapy out to your home to work with you.   I have ordered the following for you:  Lab orders: Vitamin D level  Vaccines given today: Flu vaccine  Medication changes: Start atoravastatin 40 mg daily for your high cholesterol.  Follow-up: In 1 month.  Remember: If you have any questions or concerns, please call our clinic at [phone] between 9am-5pm and after hours call [phone] and ask for the internal medicine resident on call. If you feel you are having a medical emergency please call 911.  Champ Mungo, DO

## 2021-11-17 NOTE — Assessment & Plan Note (Signed)
Assessment: Patient reports occasional "giving out" of her L leg and uses a cane for ambulatory assistance. Her L arm is completely immobile. 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 Plan: Will work on process of getting patient home health aid and PT.

## 2021-11-17 NOTE — Assessment & Plan Note (Signed)
Assessment: Patient reports constant fatigue and is concerned that her CPAP is no longer fitting appropriately. She would like to have assistance in the process of ensuring proper fit. Plan: Will speak with Purnell Shoemaker for assistance in setting up a fit check.

## 2021-11-17 NOTE — Assessment & Plan Note (Signed)
Assessment: Patients BP is right at goal today, 132/86. She denies dizziness, headaches, fainting. Plan: Continue current regimen of metoprolol 100 mg daily, hydralazine 25 mg TID, lasix 40 mg daily, amlodipine 10 mg daily, imdur 90 mg daily, spironolactone 50 mg daily.

## 2021-11-17 NOTE — Progress Notes (Signed)
CC: regular check up  HPI:  [name] is a 55 y.o. female with past medical history detailed below who presents for regular check up. Please see problem-based charting for detailed assessment and plan.  Past Medical History:  Diagnosis Date   Allergic rhinitis    Arthritis    "hips, back, legs, arms" (07/04/2014)   Asthma    hx   Automatic implantable cardioverter-defibrillator in situ    Calcifying tendinitis of shoulder    Chronic combined systolic and diastolic CHF (congestive heart failure) (HCC)    EF 40-45% by echo 12/06/2012   Chronic diastolic heart failure (HCC)     Primarily diastolic CHF: Likely due to uncontrolled HTN. Last echo (8/12) with EF 45-50%, mild to moderate LVH with some asymmetric septal hypertrophy, RV normal size and systolic function. EF 50-55% by LV-gram in 6/12.    Chronic lower back pain    secondary to DJD, obsetiy, hip problems. Followed by Dr. Ivory Broad (pain management)   Coronary artery disease    questionable. LHC 05/2011 showing normal coronaries // Followed at [facility], Dr. 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 Has had HTN since age 9. Angioedema with ACEI.  24 Hr urine and renal arterial dopplers ordered . . . Never done   LBBB (left bundle branch block)    Liver disease    Morbid obesity (HCC)    NICM (nonischemic cardiomyopathy) (HCC)    EF 45-50% in 8/12, cath 6/12 showed normal coronaries, EF 50-55% by LV gram   OSA on CPAP    sleep study in 8/12 showed moderate to severe OSA requiring CPAP   Polyneuropathy in diabetes(357.2)    Presence of permanent cardiac pacemaker    Seizures (HCC)    last 3 months   Shortness of breath    none now   Stroke [facility]) 12/2013   "my left side is paralyzed" (07/04/2014)    Thoracic or lumbosacral neuritis or radiculitis, unspecified    Type II diabetes mellitus (HCC) DX: 2002   Review of Systems:   Review of Systems  Constitutional:  Positive for malaise/fatigue. Negative for weight loss.  Respiratory:  Negative for shortness of breath.   Cardiovascular:  Positive for orthopnea (no worse than normal). Negative for chest pain.  Gastrointestinal:  Positive for constipation. Negative for abdominal pain and diarrhea.  Genitourinary:  Negative for dysuria and urgency.  Musculoskeletal:  Positive for falls.  Neurological:  Positive for tingling, sensory change (neuropathy) and weakness (especially L leg, residual from CVA).   Physical Exam:  Vitals:   11/17/21 1523  Resp: (!) 28  Temp: (!) 97.5 F (36.4 C)  TempSrc: Oral  SpO2: 100%  Weight: 247 lb 3.2 oz (112.1 kg)  Height: 5\' 9"  (1.753 m)   Constitutional: Well appearing, in wheelchair. No acute distress. Neck: Fatty deposit over posterior base of neck. Cardio: Regular rate and rhythm. No murmurs, rubs, gallops. Pulm: Clear to auscultation bilaterally. Abdomen: Soft, non-tender. MSK: Leg brace on L calf. No extremity edema noted. Skin: Warm and dry. Healing abrasion over L knee. Neuro: Alert and oriented x3. Patient is unable to voluntarily move L arm. RUE and RLE strength 5/5, LLE strength 4/5. Psych: Normal mood and affect.  Assessment &  Plan:   See Encounters Tab for problem based charting.  Patient seen with Dr.  Saverio Danker

## 2021-11-17 NOTE — Assessment & Plan Note (Signed)
Assessment: Discussed healthcare gaps with patient. She would like to get a colonoscopy set up at her next visit and is agreeable to a pap smear at that appointment as well. At this time she is declining pneumonia vaccine series and appears to have no intentions of receiving COVID-19 vaccination. Plan: Flu vaccine today. Will revisit pneumonia vaccine at next visit. Refer for colonoscopy and follow-up scheduling at next visit. Will need a pap smear at that time.

## 2021-11-17 NOTE — Assessment & Plan Note (Signed)
Assessment: Patient reports persistent neuropathy. She is well managed with gabapentin 300 mg BID, flexeril 5 mg TID PRN. Plan: Continue gabapentin 300 mg BID, flexeril 5 mg TID PRN (refilled today).

## 2021-11-18 LAB — VITAMIN D 25 HYDROXY (VIT D DEFICIENCY, FRACTURES): Vit D, 25-Hydroxy: 19.9 ng/mL — ABNORMAL LOW (ref 30.0–100.0)

## 2021-11-19 ENCOUNTER — Telehealth: Payer: Self-pay | Admitting: *Deleted

## 2021-11-19 NOTE — Telephone Encounter (Signed)
PCS FORM FAXED 11-19-2021 TO [facility] [place] FOR REVIEW.  (Phone- [phone] / fax- [phone]) patient is requesting more hours of pcs services.

## 2021-11-19 NOTE — Progress Notes (Signed)
Internal Medicine Clinic Attending  I saw and evaluated the patient.  I personally confirmed the key portions of the history and exam documented by Dr.  Dean  and I reviewed pertinent patient test results.  The assessment, diagnosis, and plan were formulated together and I agree with the documentation in the resident's note.  

## 2021-11-20 ENCOUNTER — Telehealth: Payer: Self-pay | Admitting: Internal Medicine

## 2021-11-20 MED ORDER — ERGOCALCIFEROL 10 MCG (400 UNIT) PO TABS: 800.0000 [IU] | ORAL_TABLET | Freq: Every day | ORAL | 3 refills | Status: DC

## 2021-11-20 NOTE — Addendum Note (Signed)
Addended by: Ihor Dow on: 11/20/2021 10:07 AM   Modules accepted: Orders

## 2021-11-20 NOTE — Telephone Encounter (Signed)
Inform patient that her vitamin D level was low at 19.9.  I have sent in vitamin D 100 units daily to the patient's preferred pharmacy.  She will go pick this up later today.

## 2021-11-23 ENCOUNTER — Ambulatory Visit (INDEPENDENT_AMBULATORY_CARE_PROVIDER_SITE_OTHER): Payer: Medicaid Other

## 2021-11-23 ENCOUNTER — Ambulatory Visit: Payer: Medicaid Other | Admitting: Behavioral Health

## 2021-11-23 DIAGNOSIS — Z59819 Housing instability, housed unspecified: Secondary | ICD-10-CM

## 2021-11-23 DIAGNOSIS — Z95810 Presence of automatic (implantable) cardiac defibrillator: Secondary | ICD-10-CM | POA: Diagnosis not present

## 2021-11-23 DIAGNOSIS — F419 Anxiety disorder, unspecified: Secondary | ICD-10-CM

## 2021-11-23 DIAGNOSIS — I5042 Chronic combined systolic (congestive) and diastolic (congestive) heart failure: Secondary | ICD-10-CM

## 2021-11-23 DIAGNOSIS — F331 Major depressive disorder, recurrent, moderate: Secondary | ICD-10-CM

## 2021-11-23 NOTE — BH Specialist Note (Addendum)
Integrated Behavioral Health via Telemedicine Visit  11/23/2021 DEJA PISARSKI [id number]  Number of Integrated Behavioral Health visits: 10 Session Start time: 2:30pm  Session End time: 3:00pm Total time: 30  Referring Provider: Merrilyn Puma, MD Patient/Family location: Pt is running errands w/her Irena Reichmann [facility]tr [facility] Provider location: [facility] Office All persons participating in visit: Pt & Clinician Types of Service: Individual psychotherapy  I connected with [name] and/or Luster Landsberg Hankin's  self  via  Telephone or [employer]  (Video is Surveyor, mining) and verified that I am speaking with the correct person using two identifiers. Discussed confidentiality:  10th visit  I discussed the limitations of telemedicine and the availability of in person appointments.  Discussed there is a possibility of technology failure and discussed alternative modes of communication if that failure occurs.  I discussed that engaging in this telemedicine visit, they consent to the provision of behavioral healthcare and the services will be billed under their insurance.  Patient and/or legal guardian expressed understanding and consented to Telemedicine visit:  10th visit  Presenting Concerns: Patient and/or family reports the following symptoms/concerns: reduced anx/dep due to dec'd # of ppl in her home; she is now looking again for appropriate housing Duration of problem: a few months; Severity of problem: moderate  Patient and/or Family's Strengths/Protective Factors: Social and Emotional competence, Concrete supports in place (healthy food, safe environments, etc.), and Sense of purpose. Pt is hearty & prevails due to her strong belief system.  Goals Addressed: Patient will:  Reduce symptoms of: anxiety, depression, and stress   Increase knowledge and/or ability of: coping skills   Demonstrate ability to: Increase healthy adjustment to current life  circumstances  Progress towards Goals: Ongoing  Interventions: Interventions utilized:  Solution-Focused Strategies and Supportive Counseling Standardized Assessments completed:  screeners prn  Patient and/or Family Response: Pt receptive to call today & optimistic about finding a new residence this month.  Assessment: Patient currently experiencing difficulties w/her current housing. She has a list she paid for, but will be refunded if she has no luck.  Patient may benefit from 30 min ck-in re: her situation.  Plan: Follow up with behavioral health clinician on : early Jan for 30 min ck-in on telehealth Behavioral recommendations: None today Referral(s): Integrated [employer] (In Clinic)  I discussed the assessment and treatment plan with the patient and/or parent/guardian. They were provided an opportunity to ask questions and all were answered. They agreed with the plan and demonstrated an understanding of the instructions.   They were advised to call back or seek an in-person evaluation if the symptoms worsen or if the condition fails to improve as anticipated.  Deneise Lever, LMFT

## 2021-11-26 ENCOUNTER — Telehealth: Payer: Self-pay

## 2021-11-26 NOTE — Telephone Encounter (Signed)
I spoke with the patient about missed ICM transmission. She states she just moved and did not plug in the monitor yet. She states when she get home she will send the transmission with her monitor.

## 2021-11-30 ENCOUNTER — Ambulatory Visit (INDEPENDENT_AMBULATORY_CARE_PROVIDER_SITE_OTHER): Payer: Medicaid Other

## 2021-11-30 DIAGNOSIS — I428 Other cardiomyopathies: Secondary | ICD-10-CM | POA: Diagnosis not present

## 2021-11-30 LAB — CUP PACEART REMOTE DEVICE CHECK
Battery Remaining Longevity: 48 mo
Battery Voltage: 2.97 V
Brady Statistic AP VP Percent: 0.39 %
Brady Statistic AP VS Percent: 0 %
Brady Statistic AS VP Percent: 96.52 %
Brady Statistic AS VS Percent: 3.09 %
Brady Statistic RA Percent Paced: 0.39 %
Brady Statistic RV Percent Paced: 77.63 %
Date Time Interrogation Session: 20221219033323
HighPow Impedance: 65 Ohm
Implantable Lead Implant Date: 20140910
Implantable Lead Implant Date: 20140910
Implantable Lead Implant Date: 20140910
Implantable Lead Location: 753858
Implantable Lead Location: 753859
Implantable Lead Location: 753860
Implantable Lead Model: 4298
Implantable Lead Model: 5076
Implantable Lead Model: 6935
Implantable Pulse Generator Implant Date: 20220406
Lead Channel Impedance Value: 174.595
Lead Channel Impedance Value: 178.5 Ohm
Lead Channel Impedance Value: 178.5 Ohm
Lead Channel Impedance Value: 194.634
Lead Channel Impedance Value: 199.5 Ohm
Lead Channel Impedance Value: 323 Ohm
Lead Channel Impedance Value: 380 Ohm
Lead Channel Impedance Value: 399 Ohm
Lead Channel Impedance Value: 399 Ohm
Lead Channel Impedance Value: 399 Ohm
Lead Channel Impedance Value: 437 Ohm
Lead Channel Impedance Value: 456 Ohm
Lead Channel Impedance Value: 456 Ohm
Lead Channel Impedance Value: 627 Ohm
Lead Channel Impedance Value: 646 Ohm
Lead Channel Impedance Value: 646 Ohm
Lead Channel Impedance Value: 665 Ohm
Lead Channel Impedance Value: 703 Ohm
Lead Channel Pacing Threshold Amplitude: 0.375 V
Lead Channel Pacing Threshold Amplitude: 0.625 V
Lead Channel Pacing Threshold Amplitude: 1 V
Lead Channel Pacing Threshold Pulse Width: 0.4 ms
Lead Channel Pacing Threshold Pulse Width: 0.4 ms
Lead Channel Pacing Threshold Pulse Width: 0.4 ms
Lead Channel Sensing Intrinsic Amplitude: 16.375 mV
Lead Channel Sensing Intrinsic Amplitude: 16.375 mV
Lead Channel Sensing Intrinsic Amplitude: 3.75 mV
Lead Channel Sensing Intrinsic Amplitude: 3.75 mV
Lead Channel Setting Pacing Amplitude: 1.5 V
Lead Channel Setting Pacing Amplitude: 2 V
Lead Channel Setting Pacing Amplitude: 4 V
Lead Channel Setting Pacing Pulse Width: 0.4 ms
Lead Channel Setting Pacing Pulse Width: 0.4 ms
Lead Channel Setting Sensing Sensitivity: 0.3 mV

## 2021-11-30 NOTE — Progress Notes (Signed)
EPIC Encounter for ICM Monitoring  Patient Name: [name] is a 55 y.o. female Date: 11/30/2021 Primary Care Physican: Merrilyn Puma, MD Primary Cardiologist: Shirlee Latch Electrophysiologist: Joycelyn Schmid Pacing: 93.3%         09/24/2021 Office Weight: 250 lbs           Attempted call to patient and unable to reach.  Transmission reviewed.    Optivol thoracic impedance suggesting normal fluid levels.     Prescribed: Furosemide 40 mg take 1 tablet by mouth daily Potassium 20 mEq take 1 tablet by mouth daily. Spironolactone 50 mg take 1 tablet by mouth daily.   Labs:  07/17/2021 Creatinine 0.86, BUN 12, Potassium 3.8, Sodium 137, GFR >60 04/14/2021 Creatinine 0.82, BUN <5, Potassium 3.6, Sodium 139, GFR >60  A complete set of results can be found in Results Review.   Recommendations:  Unable to reach.     Follow-up plan: ICM clinic phone appointment on 12/28/2021.   91 day device clinic remote transmission 03/01/2022.     EP/Cardiology Office Visits:  12/17/2021 with Dr Shirlee Latch.   Copy of ICM check sent to Dr. Graciela Husbands.      3 month ICM trend: 11/30/2021.    12-14 Month ICM trend:       [name], RN 11/30/2021 5:04 PM

## 2021-12-03 ENCOUNTER — Ambulatory Visit: Payer: Medicaid Other | Admitting: Licensed Clinical Social Worker

## 2021-12-03 NOTE — Chronic Care Management (AMB) (Signed)
°  Care Management   Social Work Visit Note  12/03/2021 Name: [name] MRN: [id number] DOB: [birth year]  [name] is a 55 y.o. year old female who sees Merrilyn Puma, MD for primary care. The care management team was consulted for assistance with care management and care coordination needs related to [facility] Resources    Patient was given the following information about care management and care coordination services today, agreed to services, and gave verbal consent: 1.care management/care coordination services include personalized support from designated clinical staff supervised by their physician, including individualized plan of care and coordination with other care providers 2. 24/7 contact phone numbers for assistance for urgent and routine care needs. 3. The patient may stop care management/care coordination services at any time by phone call to the office staff.  Engaged with patient by telephone for follow up visit in response to provider referral for social work chronic care management and care coordination services.  Assessment: Review of patient history, allergies, and health status during evaluation of patient need for care management/care coordination services.    Interventions:  Patient interviewed and appropriate assessments performed Collaborated with clinical team regarding patient needs  Patient advised she was doing good. Patient declined needing any resources or interventions.  SW gave patient contact information and advised patient to contact SW if needed.No additional contact will be made by SW.      Plan:  No Follow up required  Christen Butter, BSW  Social Worker [facility] Care Management  [phone]

## 2021-12-03 NOTE — Patient Instructions (Signed)
Visit Information  Instructions:   No further follow up required: .  [name] Christen Butter, BSW  Social Worker [facility] Care Management  [phone]

## 2021-12-10 NOTE — Progress Notes (Signed)
Remote ICD transmission.   

## 2021-12-15 ENCOUNTER — Telehealth: Payer: Self-pay | Admitting: *Deleted

## 2021-12-15 NOTE — Telephone Encounter (Signed)
Called patient regarding the status of her PCS referral. Lvm for patient to return call with this information, @ [phone].

## 2021-12-16 ENCOUNTER — Other Ambulatory Visit: Payer: Self-pay | Admitting: Student

## 2021-12-16 ENCOUNTER — Telehealth (HOSPITAL_COMMUNITY): Payer: Self-pay

## 2021-12-16 ENCOUNTER — Other Ambulatory Visit: Payer: Self-pay | Admitting: Internal Medicine

## 2021-12-16 DIAGNOSIS — E1142 Type 2 diabetes mellitus with diabetic polyneuropathy: Secondary | ICD-10-CM

## 2021-12-16 NOTE — Progress Notes (Incomplete)
Patient ID: [name], female   DOB: 1966-06-26, 56 y.o.   MRN: [id number]    Advanced Heart Failure Clinic Note   EP: Dr Caryl Comes PCP: [name], MD HF Cardiologist: Dr Aundra Dubin   HPI: [name] is a 56 year old with history of morbid obesity, HTN, HLD, CAD, DM2, OSA on CPAP, CVA and chronic systolic CHF in the setting of poorly-controlled HTN. She was admitted in 05/2011 with chest pain and new LBBB and ended up getting a left heart cath, which showed no angiographic CAD. Renal artery dopplers were normal in 04/2012.  She has a Medtronic CRT-D device.   Was hospitalized May 2014 with acute on chronic combined systolic and diastolic CHF. EF from December 2013 was down to 40 to 45%.   Admitted in January 2015 with slurred speech, L arm weakness and facial droop. Initial head CT showed no acute abnormality. Repeat head CT showed subtle gray white matter differentiation in R frontal lobe and R operculum perhaps representing ischemic R sided MCA. STAT repeat CT head later on 1/29 showed further evolution of R MCA territory infarct involving R insular cortex and operculum. No hemorrhage. Neurology recommended CTA head/neck, which showed occlusion of MCA branch to the area of R MCA infarct and a normal neck CTA. TEE was done and did not reveal embolic source. Was discharged to inpatient rehab.  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  She was started on Keppra.  She went to the ER in 2/16 with syncope that was probably due to seizure, no arrhythmias with device interrogation.  Echo in 11/16 showed EF back down to 25-30%.  Echo in 2/18 showed EF back up to 45%.   03/2021 Had device generator change.    Echo was done today and reviewed, EF back down to 25% with mild LV dilation, mildly decreased RV systolic function, dilated IVC.   Today she returns for HF followup. Weight is stable but she has been more short of breath recently.  Since 4/22, she has been dyspneic  walking around her house.  +Orthopnea.  Her left leg has been weak since she had a stroke, and she does not walk much.  She uses her CPAP nightly.   ECG: NSR, BiV paced  Medtronic device interrogation: thoracic impedance trending down with fluid index > threshold. Rare short AF runs.    ECHO 11/2012: 40-45% ECHO  07/2013: 20% RV normal ECHO    08/23/2013: EF 40-45%, diff HK ECHO   09/05/2013: EF 25-30%, diff HK  ECHO  12/11/13: EF 30%, diff HK, RV normal ECHO 01/14/2014: EF 35-40%, LA mod dilated  ECHO 4/16: EF 40-45%, moderate LVH, normal RV size with mildly decreased systolic function.   ECHO 11/16: EF 25-30%, moderate LVH ECHO 2/18: EF 45%, moderate LVH, normal RV size and systolic function.  ECHO 7/22: EF 25%, mild LV dilation, mildly decreased RV systolic function, dilated IVC.   Labs (5/22): K 3.6, creatinine 0.82 Labs (7/22): LDL 112  ROS: All systems negative except as listed in HPI, PMH and Problem List.  Past Medical History:  Diagnosis Date   Allergic rhinitis    Arthritis    "hips, back, legs, arms" (07/04/2014)   Asthma    hx   Automatic implantable cardioverter-defibrillator in situ    Calcifying tendinitis of shoulder    Chronic combined systolic and diastolic CHF (congestive heart failure) (HCC)    EF 40-45% by echo 12/06/2012   Chronic diastolic  heart failure ([place])     Primarily diastolic CHF: Likely due to uncontrolled HTN. Last echo (8/12) with EF 45-50%, mild to moderate LVH with some asymmetric septal hypertrophy, RV normal size and systolic function. EF 50-55% by LV-gram in 6/12.    Chronic lower back pain    secondary to DJD, obsetiy, hip problems. Followed by Dr. Oval Linsey (pain management)   Coronary artery disease    questionable. [place] 05/2011 showing normal coronaries // Followed at [facility], Dr. Aundra Dubin   Degeneration of lumbar or lumbosacral intervertebral disc    DJD (degenerative joint disease) of hip    right sided   Frequent UTI    GERD  (gastroesophageal reflux disease)    HLD (hyperlipidemia)    Hypertension    Poorly controlled. Has had HTN since age 56. Angioedema with ACEI.  24 Hr urine and renal arterial dopplers ordered . . . Never done   LBBB (left bundle branch block)    Liver disease    Morbid obesity ([place])    NICM (nonischemic cardiomyopathy) ([place])    EF 45-50% in 8/12, cath 6/12 showed normal coronaries, EF 50-55% by LV gram   OSA on CPAP    sleep study in 8/12 showed moderate to severe OSA requiring CPAP   Polyneuropathy in diabetes(357.2)    Presence of permanent cardiac pacemaker    Seizures ([place])    last 3 months   Shortness of breath    none now   Stroke [facility]) 12/2013   "my left side is paralyzed" (07/04/2014)   Thoracic or lumbosacral neuritis or radiculitis, unspecified    Type II diabetes mellitus ([place]) DX: 2002    Current Outpatient Medications  Medication Sig Dispense Refill   Accu-Chek FastClix Lancets MISC Check blood sugar 2 times a day 102 each 12   amLODipine (NORVASC) 10 MG tablet Take 1 tablet (10 mg total) by mouth daily. 90 tablet 3   atorvastatin (LIPITOR) 40 MG tablet Take 1 tablet (40 mg total) by mouth daily. 90 tablet 2   Blood Glucose Monitoring Suppl (ACCU-CHEK GUIDE) w/Device KIT 1 each by Does not apply route 3 (three) times daily. 1 kit 1   calamine lotion Apply 1 application topically as needed for itching. 120 mL 0   cyanocobalamin 1000 MCG tablet Take 1 tablet by mouth daily.     cyclobenzaprine (FLEXERIL) 5 MG tablet Take 1 tablet (5 mg total) by mouth 3 (three) times daily as needed for muscle spasms. 30 tablet 0   dapagliflozin propanediol (FARXIGA) 10 MG TABS tablet Take 1 tablet (10 mg total) by mouth daily before breakfast. 30 tablet 11   diclofenac Sodium (VOLTAREN) 1 % GEL APPLY 4 GRAMS TO LEFT KNEE FOUR TIMES DAILY. 300 g 2   Dulaglutide (TRULICITY) 1.5 ON/6.2XB SOPN Inject 1.5 mg into the skin once a week. 9 mL 1   Ergocalciferol 10 MCG (400 UNIT) TABS Take 800  Units by mouth daily. 30 tablet 3   fluticasone (CUTIVATE) 0.05 % cream APPLY TO THE AFFECTED AREA(S) TWICE DAILY 30 g 6   furosemide (LASIX) 40 MG tablet Take 1 tablet (40 mg total) by mouth daily. 90 tablet 3   gabapentin (NEURONTIN) 300 MG capsule Take 1 capsule (300 mg total) by mouth 2 (two) times daily. 90 capsule 1   glucose blood (ACCU-CHEK GUIDE) test strip Check blood sugar 2 times per day 100 each 10   hydrALAZINE (APRESOLINE) 25 MG tablet Take 1 tablet (25 mg total) by  mouth 3 (three) times daily. 90 tablet 11   isosorbide mononitrate (IMDUR) 60 MG 24 hr tablet Take 1.5 tablets (90 mg total) by mouth daily. 135 tablet 2   levETIRAcetam (KEPPRA) 500 MG tablet Take 1 tablet (500 mg total) by mouth 2 (two) times daily. 180 tablet 3   linaclotide (LINZESS) 290 MCG CAPS capsule Take 1 capsule (290 mcg total) by mouth daily as needed (constipation). 30 capsule 2   metoprolol succinate (TOPROL-XL) 100 MG 24 hr tablet TAKE ONE TABLET BY MOUTH EVERY DAY 30 tablet 3   pantoprazole (PROTONIX) 40 MG tablet Take 1 tablet (40 mg total) by mouth daily. 90 tablet 3   potassium chloride SA (KLOR-CON M20) 20 MEQ tablet Take 1 tablet (20 mEq total) by mouth daily. 90 tablet 3   solifenacin (VESICARE) 10 MG tablet TAKE ONE TABLET BY MOUTH EVERY DAY 30 tablet 5   spironolactone (ALDACTONE) 50 MG tablet Take 1 tablet (50 mg total) by mouth daily. 90 tablet 3   STOOL SOFTENER/LAXATIVE 50-8.6 MG tablet TAKE ONE TABLET BY MOUTH AT BEDTIME AS NEEDED FOR MILD CONSTIPATION 30 tablet 0   XARELTO 20 MG TABS tablet TAKE ONE TABLET BY MOUTH EVERY DAY WITH SUPPER 30 tablet 2   No current facility-administered medications for this visit.   There were no vitals filed for this visit.   Wt Readings from Last 3 Encounters:  11/17/21 112.1 kg (247 lb 3.2 oz)  09/24/21 113.4 kg (250 lb)  07/17/21 111.4 kg (245 lb 9.6 oz)    PHYSICAL EXAM: General: NAD Neck: JVP 10 cm, no thyromegaly or thyroid nodule.  Lungs: Clear  to auscultation bilaterally with normal respiratory effort. CV: Nondisplaced PMI.  Heart regular S1/S2, no S3/S4, no murmur.  1+ ankle edema.  No carotid bruit.  Normal pedal pulses.  Abdomen: Soft, nontender, no hepatosplenomegaly, no distention.  Skin: Intact without lesions or rashes.  Neurologic: Alert and oriented x 3.  Psych: Normal affect. Extremities: No clubbing or cyanosis.  HEENT: Normal.   ASSESSMENT/PLAN: 1. Chronic systolic HF: Nonischemic cardiomyopathy, normal coronaries on 6/12 angiography.  Cardiomyopathy may be due to long-standing and poorly-controlled HTN.  Echo 11/16 EF 25-30%, echo 2/18 with EF improved to 45%.  S/P CRT-D Medtronic.  Echo today showed EF back down to 25% with mildly decreased RV systolic function.  She is volume overloaded on exam and by Optivol with NYHA class III symptoms.  - Continue ANTHEM study in the future (poor response to CRT).  - Not on Entresto due to h/o angioedema with ACEi  - Increase Lasix to 40 mg bid x 2 days then 40 mg daily after that.  Add KCl 20 daily.  BMET today and in 10 days.  - Add dapagliflozin 10 mg daily.  - Continue Toprol XL 100 mg daily.  - Continue spironolactone 50 mg daily.  - Continue Imdur 90 mg daily and add hydralazine 25 mg tid (she used to take hydralazine 100 tid).  - Will try her on valsartan eventually (cannot take ACEI or Entresto).  2. HTN: BP controlled.  Can stop amlodipine in the future to allow uptitration of GDMT.  3. Morbid obesity:  There is no height or weight on file to calculate BMI. 4. OSA on CPAP:  Continue CPAP hs.  5. Atrial fibrillation: PAF. Patient had a CVA in the past.  CHADSVASC = 5.  - Continue Xarelto.   6. CVA: Prior CVA with left leg weakness.   Followup in 3 wks  with APP.   [place]  12/16/2021

## 2021-12-16 NOTE — Telephone Encounter (Signed)
Called to confirm/remind patient of their appointment at the Advanced Heart Failure Clinic on 12/17/21.   Patient reminded to bring all medications and/or complete list.  Confirmed patient has transportation. Gave directions, instructed to utilize valet parking.  Confirmed appointment prior to ending call.

## 2021-12-17 ENCOUNTER — Encounter (HOSPITAL_COMMUNITY): Payer: Medicaid Other

## 2021-12-17 ENCOUNTER — Other Ambulatory Visit: Payer: Self-pay

## 2021-12-17 DIAGNOSIS — E1140 Type 2 diabetes mellitus with diabetic neuropathy, unspecified: Secondary | ICD-10-CM

## 2021-12-20 MED ORDER — ACCU-CHEK FASTCLIX LANCETS MISC: 12 refills | Status: DC

## 2021-12-21 ENCOUNTER — Ambulatory Visit: Payer: Medicaid Other

## 2021-12-22 ENCOUNTER — Ambulatory Visit: Payer: Medicaid Other | Admitting: Behavioral Health

## 2021-12-22 DIAGNOSIS — F419 Anxiety disorder, unspecified: Secondary | ICD-10-CM

## 2021-12-22 DIAGNOSIS — F331 Major depressive disorder, recurrent, moderate: Secondary | ICD-10-CM

## 2021-12-22 NOTE — BH Specialist Note (Signed)
Integrated Behavioral Health via Telemedicine Visit  12/22/2021 ESBEIDY PIGG [id number]  Number of Integrated Behavioral Health visits: 11 Session Start time: 1:00pm  Session End time: 1:30pm Total time: 30  Referring Provider: Dr. Merrilyn Puma, MD Patient/Family location: Pt is in car to gain privacy from her Dtr's home where she is living now [facility] Provider location: [facility] Office All persons participating in visit: Pt & Clinician Types of Service: Individual psychotherapy  I connected with [name] and/or Luster Landsberg Pettie's  self  via  Telephone or [employer]  (Video is Surveyor, mining) and verified that I am speaking with the correct person using two identifiers. Discussed confidentiality:  11th visit  I discussed the limitations of telemedicine and the availability of in person appointments.  Discussed there is a possibility of technology failure and discussed alternative modes of communication if that failure occurs.  I discussed that engaging in this telemedicine visit, they consent to the provision of behavioral healthcare and the services will be billed under their insurance.  Patient and/or legal guardian expressed understanding and consented to Telemedicine visit:  11th visit  Presenting Concerns: Patient and/or family reports the following symptoms/concerns: elevated anx/dep & stress due to living w/Dtr & her Family-the duress this produces has become too much. Duration of problem: months; Severity of problem: moderate  Patient and/or Family's Strengths/Protective Factors: Social and Emotional competence, Concrete supports in place (healthy food, safe environments, etc.), and Sense of purpose  Goals Addressed: Patient will:  Reduce symptoms of: anxiety, depression, stress, and housing instability    Increase knowledge and/or ability of: coping skills and stress reduction   Demonstrate ability to: Increase healthy adjustment to  current life circumstances and Increase adequate support systems for patient/family  Progress towards Goals: Ongoing  Interventions: Interventions utilized:  Solution-Focused Strategies and Supportive Counseling Standardized Assessments completed:  screeners prn  Patient and/or Family Response: Pt receptive to call & verbal about her situation today. She has reached her limit w/her Dtr's attitude.  Pt requests future appts.  Assessment: Patient currently experiencing elevated anx/dep due to making her dec to move out of Dtr;s Apt where she has been living for the past 3 wks. She is angry today & emot'l about her circumstances.   Patient may benefit from cont'd ck-in sessions for support & encouragement.  Plan: Follow up with behavioral health clinician on : 2-3 wks on telehealth for 30 min Behavioral recommendations: Use the survival skills Referral(s): Integrated [employer] (In Clinic) and RN Chronic Care Mgr Jodelle Gross, RN & Christen Butter, [place]  I discussed the assessment and treatment plan with the patient and/or parent/guardian. They were provided an opportunity to ask questions and all were answered. They agreed with the plan and demonstrated an understanding of the instructions.   They were advised to call back or seek an in-person evaluation if the symptoms worsen or if the condition fails to improve as anticipated.  Deneise Lever, LMFT

## 2021-12-24 ENCOUNTER — Other Ambulatory Visit: Payer: Self-pay | Admitting: Internal Medicine

## 2021-12-24 ENCOUNTER — Other Ambulatory Visit: Payer: Self-pay | Admitting: Student

## 2021-12-30 NOTE — Progress Notes (Signed)
No ICM remote transmission received for 12/28/2021 and next ICM transmission scheduled for 01/18/2022.   °

## 2022-01-07 ENCOUNTER — Other Ambulatory Visit: Payer: Self-pay | Admitting: Student

## 2022-01-07 DIAGNOSIS — E1142 Type 2 diabetes mellitus with diabetic polyneuropathy: Secondary | ICD-10-CM

## 2022-01-12 ENCOUNTER — Other Ambulatory Visit: Payer: Self-pay | Admitting: Student

## 2022-01-12 DIAGNOSIS — M1712 Unilateral primary osteoarthritis, left knee: Secondary | ICD-10-CM

## 2022-01-13 ENCOUNTER — Ambulatory Visit: Payer: Medicaid Other | Admitting: Behavioral Health

## 2022-01-13 DIAGNOSIS — F419 Anxiety disorder, unspecified: Secondary | ICD-10-CM

## 2022-01-13 DIAGNOSIS — F331 Major depressive disorder, recurrent, moderate: Secondary | ICD-10-CM

## 2022-01-13 NOTE — BH Specialist Note (Signed)
Integrated Behavioral Health via Telemedicine Visit  01/13/2022 ZHARIAH ZALES [id number]  Number of [place] visits: 12 Session Start time: 10:30am  Session End time: 11:00am Total time: 30  Referring Provider: Dr. [name], MD Patient/Family location: Pt is in her car w/her Niece Ariel [facility] Provider location: [facility] Office All persons participating in visit: Pt & Clinician Types of Service: Family psychotherapy  I connected with Ceasar Lund and/or Arnold Long Jurek's  self  via  Telephone or Geologist, engineering  (Video is Tree surgeon) and verified that I am speaking with the correct person using two identifiers. Discussed confidentiality:  12th visit  I discussed the limitations of telemedicine and the availability of in person appointments.  Discussed there is a possibility of technology failure and discussed alternative modes of communication if that failure occurs.  I discussed that engaging in this telemedicine visit, they consent to the provision of behavioral healthcare and the services will be billed under their insurance.  Patient and/or legal guardian expressed understanding and consented to Telemedicine visit:  12th visit  Presenting Concerns: Patient and/or family reports the following symptoms/concerns: Pt is currently residing w/her Dtr & Niece-it is working so far & she is active in her housing seeking Duration of problem: months; Severity of problem: moderate  Patient and/or Family's Strengths/Protective Factors: Concrete supports in place (healthy food, safe environments, etc.), Sense of purpose, and Physical Health (exercise, healthy diet, medication compliance, etc.)  Goals Addressed: Patient will:  Reduce symptoms of: agitation, anxiety, and depression; agitation is due to Pt's own stressors & the added burden of helping Family & lacking the reciprocity she needs for support  Increase knowledge and/or  ability of: self-management skills and stress reduction   Demonstrate ability to: Increase healthy adjustment to current life circumstances  Progress towards Goals: Ongoing  Interventions: Interventions utilized:  Solution-Focused Strategies Standardized Assessments completed:  screeners prn  Patient and/or Family Response: Pt receptive to call, in the car w/her 24yo Niece Ariel getting some bkfst on the way dwntwn to pick up 56yo Son Dwayn. Pt requests we cont discussion next call as Son is not being respectful.   Assessment: Patient currently experiencing inc in anx/dep due to housing insecurity for the 2nd time in a year. Pt is persistent & determined in her search for a house & realizes an Apt may be more practical. Pt is working w/the situation in her Dtr's home temporarily.  Pt is busy today picking up her Son Marolyn Haller in dwntwn [place] from [place]. Son & Pt quickly became engaged in argument in the car. Gave Family some input & ended call.   Patient may benefit from con't mental health wellness ck-ins & support for her situation.  Plan: Follow up with behavioral health clinician on : 2-3 wks for 30 min telehealth Behavioral recommendations: Session was too chaotic today to address many needs. Pt will cont calls.  Referral(s): [facility] (In Clinic)  I discussed the assessment and treatment plan with the patient and/or parent/guardian. They were provided an opportunity to ask questions and all were answered. They agreed with the plan and demonstrated an understanding of the instructions.   They were advised to call back or seek an in-person evaluation if the symptoms worsen or if the condition fails to improve as anticipated.  Donnetta Hutching, LMFT

## 2022-01-21 ENCOUNTER — Telehealth: Payer: Self-pay

## 2022-01-21 NOTE — Telephone Encounter (Signed)
I spoke with the patient and she agreed to send missed ICM transmission today. ?

## 2022-01-22 NOTE — Progress Notes (Signed)
No ICM remote transmission received for 01/18/2022 and next ICM transmission scheduled for 02/15/2022.   ° °

## 2022-01-23 ENCOUNTER — Other Ambulatory Visit: Payer: Self-pay | Admitting: Student

## 2022-01-23 DIAGNOSIS — I4891 Unspecified atrial fibrillation: Secondary | ICD-10-CM

## 2022-01-25 ENCOUNTER — Other Ambulatory Visit: Payer: Self-pay

## 2022-01-25 DIAGNOSIS — G40209 Localization-related (focal) (partial) symptomatic epilepsy and epileptic syndromes with complex partial seizures, not intractable, without status epilepticus: Secondary | ICD-10-CM

## 2022-01-26 ENCOUNTER — Telehealth: Payer: Self-pay | Admitting: *Deleted

## 2022-01-26 MED ORDER — LEVETIRACETAM 500 MG PO TABS: 500.0000 mg | ORAL_TABLET | Freq: Two times a day (BID) | ORAL | 3 refills | Status: DC

## 2022-01-26 NOTE — Telephone Encounter (Signed)
Spoke with Victorino Dike @ [facility] [phone]) regarding the status of the [facility] referral. Was unable to ever get in contact with patient. Per Victorino Dike patient is receiving care through [facility],  patient can not get care service from care services.

## 2022-01-26 NOTE — Telephone Encounter (Signed)
Called pt - no answer; "mailbox has not been set up yet", unable to leave a message. Per pharmacy's note - This prescription was filled on 01/07/2022. Any refills authorized will be placed on file.

## 2022-02-04 ENCOUNTER — Encounter: Payer: Medicaid Other | Admitting: Student

## 2022-02-05 ENCOUNTER — Telehealth: Payer: Self-pay

## 2022-02-05 NOTE — Telephone Encounter (Signed)
Pa  for pt ( SOLIFENACIN SUCCINATE 10 MG TAB )  came through on cover my meds was submitted with office notes  from 12/6.[name] awaiting approval or denial ..[name]

## 2022-02-08 ENCOUNTER — Telehealth (HOSPITAL_COMMUNITY): Payer: Self-pay | Admitting: Pharmacy Technician

## 2022-02-08 ENCOUNTER — Telehealth: Payer: Self-pay

## 2022-02-08 NOTE — Telephone Encounter (Signed)
(   COPY PLACED TO BE SCANNED IN CHART AND DR NOTIFIED )

## 2022-02-08 NOTE — Telephone Encounter (Signed)
PA  for pt ( ACCU CHECK TEST STRIPS ) came through on cover my meds was submitted with office notes and labs .. awaiting approval or denial

## 2022-02-08 NOTE — Telephone Encounter (Signed)
DECISION :    Denied on February 24     Denied. Unable to approve the medication (SOLIFENACIN SUCCINATE Tablet 10MG ) due to unmet criteria (3) as outlined in the plan guideline below. Member must try and fail two of the following drug alternatives: (tolterodine, oxybutynin, tropism). If you are looking for additional alternatives, please refer to the plan's preferred drug list.    ( COPY PLACED  TO BE SCANNED IN CHART .. DR MADE AWARE )

## 2022-02-08 NOTE — Telephone Encounter (Signed)
DECISION:       Denied    Insurance note :   Please use formulary product : True Metrix , Turetrack tet        ( COPY PL

## 2022-02-08 NOTE — Telephone Encounter (Signed)
Advanced Heart Failure Patient Advocate Encounter  Prior Authorization for Wilder Glade has been approved.    PA# [id number]  Effective dates: 01/25/22 through 02/05/23  Charlann Boxer, CPhT

## 2022-02-08 NOTE — Telephone Encounter (Signed)
Patient Advocate Encounter   Received notification from [employer] that prior authorization for [name] is required.   PA submitted on CoverMyMeds Key [id number] Status is pending   Will continue to follow.

## 2022-02-09 ENCOUNTER — Ambulatory Visit: Payer: Medicaid Other | Admitting: Behavioral Health

## 2022-02-09 DIAGNOSIS — F419 Anxiety disorder, unspecified: Secondary | ICD-10-CM

## 2022-02-09 DIAGNOSIS — F331 Major depressive disorder, recurrent, moderate: Secondary | ICD-10-CM

## 2022-02-09 NOTE — BH Specialist Note (Signed)
Integrated Behavioral Health via Telemedicine Visit  02/09/2022 [name] [id number]  Number of Integrated Behavioral Health Clinician visits: 13 Session Start time: 11:00am Session End time: 11:30am Total time in minutes: 30 min  Referring Provider: Dr. Everardo Pacific, MD Patient/Family location: Pt is in a Hotel Room in private [facility] Provider location: Working remotely All persons participating in visit: Pt & Clinician Types of Service: Individual psychotherapy  I connected with [name] and/or [name]'s  self  via  Telephone or [employer]  (Video is Surveyor, mining) and verified that I am speaking with the correct person using two identifiers. Discussed confidentiality: Yes   I discussed the limitations of telemedicine and the availability of in person appointments.  Discussed there is a possibility of technology failure and discussed alternative modes of communication if that failure occurs.  I discussed that engaging in this telemedicine visit, they consent to the provision of behavioral healthcare and the services will be billed under their insurance.  Patient and/or legal guardian expressed understanding and consented to Telemedicine visit: Yes   Presenting Concerns: Patient and/or family reports the following symptoms/concerns: elevated anx/dep due to having to jump from one housing sit to another over the past year w/o Family support Duration of problem: over a year; Severity of problem: moderate  Patient and/or Family's Strengths/Protective Factors: Social and Emotional competence, Concrete supports in place (healthy food, safe environments, etc.), Sense of purpose, and Physical Health (exercise, healthy diet, medication compliance, etc.)  Goals Addressed: Patient will:  Reduce symptoms of: anxiety, depression, and stress   Increase knowledge and/or ability of: coping skills and stress reduction   Demonstrate ability  to: Increase healthy adjustment to current life circumstances  Progress towards Goals: Ongoing  Interventions: Interventions utilized:  Solution-Focused Strategies, Behavioral Activation, and Supportive Counseling Standardized Assessments completed:  screeners prn  Patient and/or Family Response: Pt receptive to call today & requests future appt  Assessment: Patient currently experiencing homelessness for a third time in one year. She has done much for her Family & now knows she cannot count on for support. Pt has reliance on her faith & is ignoring Family since they have not been supportive.  Pt has a plan & intent to care for herself.  Patient may benefit from cont'd ck-in sessions for mental health wellness.  Plan: Follow up with behavioral health clinician on : 2-3 wks on telehealth for 30 min Behavioral recommendations: Pt needs a home & is actively searching for housing Referral(s): Integrated [employer] (In Clinic)  I discussed the assessment and treatment plan with the patient and/or parent/guardian. They were provided an opportunity to ask questions and all were answered. They agreed with the plan and demonstrated an understanding of the instructions.   They were advised to call back or seek an in-person evaluation if the symptoms worsen or if the condition fails to improve as anticipated.  Deneise Lever, LMFT

## 2022-02-11 ENCOUNTER — Encounter: Payer: Medicaid Other | Admitting: Internal Medicine

## 2022-02-15 ENCOUNTER — Ambulatory Visit (INDEPENDENT_AMBULATORY_CARE_PROVIDER_SITE_OTHER): Payer: Medicaid Other

## 2022-02-15 DIAGNOSIS — I5042 Chronic combined systolic (congestive) and diastolic (congestive) heart failure: Secondary | ICD-10-CM

## 2022-02-15 DIAGNOSIS — Z95810 Presence of automatic (implantable) cardiac defibrillator: Secondary | ICD-10-CM | POA: Diagnosis not present

## 2022-02-16 ENCOUNTER — Encounter: Payer: Self-pay | Admitting: Internal Medicine

## 2022-02-16 ENCOUNTER — Ambulatory Visit: Payer: Medicaid Other | Admitting: Internal Medicine

## 2022-02-16 ENCOUNTER — Other Ambulatory Visit (HOSPITAL_COMMUNITY)
Admission: RE | Admit: 2022-02-16 | Discharge: 2022-02-16 | Disposition: A | Discharge status: Home / Self Care | Payer: Medicaid Other | Source: Ambulatory Visit | Attending: Internal Medicine | Admitting: Internal Medicine

## 2022-02-16 ENCOUNTER — Other Ambulatory Visit: Payer: Self-pay

## 2022-02-16 ENCOUNTER — Ambulatory Visit (HOSPITAL_COMMUNITY)
Admission: RE | Admit: 2022-02-16 | Discharge: 2022-02-16 | Disposition: A | Discharge status: Home / Self Care | Payer: Medicaid Other | Source: Ambulatory Visit | Attending: Family Medicine | Admitting: Family Medicine

## 2022-02-16 VITALS — BP 153/97 | HR 112 | Temp 98.1°F | Ht 69.0 in | Wt 256.8 lb

## 2022-02-16 DIAGNOSIS — E1165 Type 2 diabetes mellitus with hyperglycemia: Secondary | ICD-10-CM

## 2022-02-16 DIAGNOSIS — B372 Candidiasis of skin and nail: Secondary | ICD-10-CM

## 2022-02-16 DIAGNOSIS — I5042 Chronic combined systolic (congestive) and diastolic (congestive) heart failure: Secondary | ICD-10-CM

## 2022-02-16 DIAGNOSIS — R000 Tachycardia, unspecified: Secondary | ICD-10-CM | POA: Insufficient documentation

## 2022-02-16 DIAGNOSIS — N898 Other specified noninflammatory disorders of vagina: Secondary | ICD-10-CM | POA: Insufficient documentation

## 2022-02-16 DIAGNOSIS — E1142 Type 2 diabetes mellitus with diabetic polyneuropathy: Secondary | ICD-10-CM

## 2022-02-16 DIAGNOSIS — E1140 Type 2 diabetes mellitus with diabetic neuropathy, unspecified: Secondary | ICD-10-CM

## 2022-02-16 DIAGNOSIS — Z0000 Encounter for general adult medical examination without abnormal findings: Secondary | ICD-10-CM

## 2022-02-16 DIAGNOSIS — R32 Unspecified urinary incontinence: Secondary | ICD-10-CM

## 2022-02-16 DIAGNOSIS — E559 Vitamin D deficiency, unspecified: Secondary | ICD-10-CM

## 2022-02-16 DIAGNOSIS — I110 Hypertensive heart disease with heart failure: Secondary | ICD-10-CM

## 2022-02-16 DIAGNOSIS — I10 Essential (primary) hypertension: Secondary | ICD-10-CM

## 2022-02-16 LAB — POCT GLYCOSYLATED HEMOGLOBIN (HGB A1C): Hemoglobin A1C: 9.5 % — AB (ref 4.0–5.6)

## 2022-02-16 LAB — GLUCOSE, CAPILLARY: Glucose-Capillary: 248 mg/dL — ABNORMAL HIGH (ref 70–99)

## 2022-02-16 MED ORDER — SPIRONOLACTONE 100 MG PO TABS: 100.0000 mg | ORAL_TABLET | Freq: Every day | ORAL | 2 refills | Status: DC

## 2022-02-16 MED ORDER — BASAGLAR KWIKPEN 100 UNIT/ML ~~LOC~~ SOPN: 30.0000 [IU] | PEN_INJECTOR | Freq: Every day | SUBCUTANEOUS | 0 refills | Status: DC

## 2022-02-16 MED ORDER — ACCU-CHEK GUIDE VI STRP: ORAL_STRIP | 10 refills | Status: DC

## 2022-02-16 MED ORDER — NYSTATIN 100000 UNIT/GM EX POWD: 1.0000 "application " | Freq: Three times a day (TID) | CUTANEOUS | 0 refills | Status: DC

## 2022-02-16 MED ORDER — ACCU-CHEK GUIDE W/DEVICE KIT: PACK | 0 refills | Status: DC

## 2022-02-16 MED ORDER — ACCU-CHEK FASTCLIX LANCETS MISC: 12 refills | Status: DC

## 2022-02-16 MED ORDER — VITAMIN D 25 MCG (1000 UNIT) PO TABS: 1000.0000 [IU] | ORAL_TABLET | Freq: Every day | ORAL | 0 refills | Status: DC

## 2022-02-16 MED ORDER — TRULICITY 1.5 MG/0.5ML ~~LOC~~ SOAJ: 3.0000 mg | SUBCUTANEOUS | 1 refills | Status: DC

## 2022-02-16 NOTE — Assessment & Plan Note (Addendum)
BP Readings from Last 3 Encounters:  ?02/16/22 (!) 153/97  ?09/24/21 140/86  ?07/17/21 124/70  ? ? ?The patient is on metoprolol, hydralazine, Lasix, amlodipine, Imdur, and spironolactone at home.  She states that her blood pressures at home typically range in the 140 systolic. ? ?Plan: ?Increased spironolactone to 100 mg daily.  Will need BMP at next follow-up. ? ?

## 2022-02-16 NOTE — Assessment & Plan Note (Signed)
Orders placed for colonoscopy and mammogram.  

## 2022-02-16 NOTE — Progress Notes (Signed)
? ?  CC: routine follow-up ? ?HPI: ? ?[name] is a 56 y.o. with past medical history as noted below who presents to the clinic today for a routine follow-up. Please see problem-based list for further details, assessments, and plans. ? ?Past Medical History:  ?Diagnosis Date  ? Allergic rhinitis   ? Arthritis   ? "hips, back, legs, arms" (07/04/2014)  ? Asthma   ? hx  ? Automatic implantable cardioverter-defibrillator in situ   ? Calcifying tendinitis of shoulder   ? Chronic combined systolic and diastolic CHF (congestive heart failure) (HCC)   ? EF 40-45% by echo 12/06/2012  ? Chronic diastolic heart failure (HCC)   ?  Primarily diastolic CHF: Likely due to uncontrolled HTN. Last echo (8/12) with EF 45-50%, mild to moderate LVH with some asymmetric septal hypertrophy, RV normal size and systolic function. EF 50-55% by LV-gram in 6/12.   ? Chronic lower back pain   ? secondary to DJD, obsetiy, hip problems. Followed by Dr. Ivory Broad (pain management)  ? Coronary artery disease   ? questionable. LHC 05/2011 showing normal coronaries // Followed at [facility], Dr. Shirlee Latch  ? Degeneration of lumbar or lumbosacral intervertebral disc   ? DJD (degenerative joint disease) of hip   ? right sided  ? Frequent UTI   ? GERD (gastroesophageal reflux disease)   ? HLD (hyperlipidemia)   ? Hypertension   ? Poorly controlled. Has had HTN since age 46. Angioedema with ACEI.  24 Hr urine and renal arterial dopplers ordered . . . Never done  ? LBBB (left bundle branch block)   ? Liver disease   ? Morbid obesity (HCC)   ? NICM (nonischemic cardiomyopathy) (HCC)   ? EF 45-50% in 8/12, cath 6/12 showed normal coronaries, EF 50-55% by LV gram  ? OSA on CPAP   ? sleep study in 8/12 showed moderate to severe OSA requiring CPAP  ? Polyneuropathy in diabetes(357.2)   ? Presence of permanent cardiac pacemaker   ? Seizures (HCC)   ? last 3 months  ? Shortness of breath   ? none now  ? Stroke [facility]) 12/2013  ? "my left side is  paralyzed" (07/04/2014)  ? Thoracic or lumbosacral neuritis or radiculitis, unspecified   ? Type II diabetes mellitus (HCC) DX: 2002  ? ?Review of Systems: Negative aside from that listed in individualized problem based charting. ? ?Physical Exam: ? ?Vitals:  ? 02/16/22 0929 02/16/22 0952  ?BP: (!) 157/91 (!) 153/97  ?Pulse: (!) 52 (!) 112  ?Temp: 98.1 ?F (36.7 ?C)   ?TempSrc: Oral   ?SpO2: 96%   ?Weight: 256 lb 12.8 oz (116.5 kg)   ?Height: 5\' 9"  (1.753 m)   ? ?General: NAD, nl appearance ?HE: Normocephalic, atraumatic, EOMI, Conjunctivae normal ?ENT: No congestion, no rhinorrhea, no exudate or erythema  ?Cardiovascular: Tachycardic, regular rhythm. No murmurs, rubs, or gallops ?Pulmonary: Effort normal, breath sounds normal. No wheezes, rales, or rhonchi ?Abdominal: soft, suprapubic tenderness, bowel sounds present ?Musculoskeletal: no swelling, deformity, injury or tenderness in extremities ?Skin: Warm, dry, no bruising, erythema, or rash ?Psychiatric/Behavioral: normal mood, normal behavior   ?Pelvic exam: Performed in the presence of chaperone.  There are small cuts on external vaginal exam, which patient says are from when she scratches.  Whitish discharge noted.  Unable to visualize cervix. ? ? ?Assessment & Plan:  ? ?See Encounters Tab for problem based charting. ? ?Patient discussed with Dr. Antony Contras ? ?

## 2022-02-16 NOTE — Patient Instructions (Signed)
Thank you, [name] for allowing [place] to provide your care today. Today we discussed your diabetes, blood pressures, vaginal discharge, and your high heart rate. ? ?Blood pressure: I have increased your spironolactone to 100 mg daily. ? ?Diabetes: Please discontinue Marcelline Deist because of your vaginal discharge, and we will start you on glargine, 30 units daily. I have ordered you a new meter too. ? ?Vaginal discharge: We took samples during her pelvic exam today.  I will contact you with these results. ? ?High heart rate: Please reach out to your cardiologist as soon as you can to discuss your high heart rate despite being paced. ? ?I have referred you to gastroenterology for colonoscopy, and I have referred you to gynecology for Pap smear. ? ?I have ordered the following labs for you: ? ?Lab Orders    ?     Glucose, capillary    ?     Urinalysis, Reflex Microscopic   ?     POC Hbg A1C     ? ? ?Referrals ordered today:  ? ?Referral Orders    ?     Ambulatory referral to Gastroenterology    ?     Ambulatory referral to Gynecology     ? ?I have ordered the following medication/changed the following medications:  ? ?Stop the following medications: ?Medications Discontinued During This Encounter  ?Medication Reason  ? spironolactone (ALDACTONE) 50 MG tablet   ? dapagliflozin propanediol (FARXIGA) 10 MG TABS tablet   ? Dulaglutide (TRULICITY) 1.5 MG/0.5ML SOPN Reorder  ?  ? ?Start the following medications: ?Meds ordered this encounter  ?Medications  ? Dulaglutide (TRULICITY) 1.5 MG/0.5ML SOPN  ?  Sig: Inject 3 mg into the skin once a week.  ?  Dispense:  9 mL  ?  Refill:  1  ? spironolactone (ALDACTONE) 100 MG tablet  ?  Sig: Take 1 tablet (100 mg total) by mouth daily.  ?  Dispense:  30 tablet  ?  Refill:  2  ? Insulin Glargine (BASAGLAR KWIKPEN) 100 UNIT/ML  ?  Sig: Inject 30 Units into the skin daily.  ?  Dispense:  15 mL  ?  Refill:  0  ? nystatin powder  ?  Sig: Apply 1 application. topically 3 (three) times  daily.  ?  Dispense:  15 g  ?  Refill:  0  ? cholecalciferol (VITAMIN D3) 25 MCG (1000 UNIT) tablet  ?  Sig: Take 1 tablet (1,000 Units total) by mouth daily.  ?  Dispense:  90 tablet  ?  Refill:  0  ?  ? ?Follow up:  1 month   ? ?Remember:  ? ?Should you have any questions or concerns please call the internal medicine clinic at [phone].   ? ? ? ?

## 2022-02-16 NOTE — Assessment & Plan Note (Signed)
Refilled nystatin today. ?

## 2022-02-16 NOTE — Assessment & Plan Note (Signed)
Of note, the patient was tachycardic today.  She is followed by cardiology.  Ordered EKG in the clinic, which showed paced rate of 106, increased compared to priors.  Patient was instructed to call her cardiologist to discuss this further.  She will be seen by cardiology on March 20. ?

## 2022-02-16 NOTE — Assessment & Plan Note (Signed)
>>  ASSESSMENT AND PLAN FOR URINARY INCONTINENCE WRITTEN ON 02/16/2022 12:35 PM BY Orvis Brill, MD  The patient has urinary continence for which she uses supplies due to not being able to go to the bathroom quickly.  She uses pads inside her briefs.  She states that she was told that "when she had a stroke, her bladder also had a stroke."

## 2022-02-16 NOTE — Assessment & Plan Note (Addendum)
The patient has a history of recurrent vaginal yeast infections.  She states that she feels these have become more frequent since starting Farxiga.  She endorses urinary frequency, discharge that looks like "dirt," and possible dysuria,  however she denies hematuria.  She is currently sexually active and is amenable to STI testing today.  Exam: Performed in the presence of chaperone.  There are some small cuts on external exam, which patient says are from when she scratches that area.  There is whitish colored discharge, unable to visualize the cervix after multiple attempts.  Plan: UA, cervical vaginal ancillary testing obtained.  We will contact patient with results.  Also refer to gynecology given difficulty in performing Pap smear today  Addendum Pap testing positive for trichomonas and candidia glabrata. Will treat trichomonas with metro 500 mg BID. Will recommend her most recent sexual partner be treated as well. If no improvement of symptoms, will then treat [place] glabrata. Current recommendations are to treat other etiologies of infection first and if symptoms persist to treat candida glabrata.

## 2022-02-16 NOTE — Assessment & Plan Note (Addendum)
The patient has a history of poorly controlled type 2 diabetes.  Most recent A1c of 9.5.  Per review of records, patient does not want to take Sherron Monday as she believes it causes seizures.  She has been counseled extensively on this, however she still declines this medication.  She has been on insulin before (52 units daily), however she was taken off of this to start Trulicity per patient.  Right now, she is on Trulicity 1.5 mg weekly and Farxiga 10 mg daily.  The patient says she feels that she has had yeast infections since starting Farxiga. ? ?Plan: ?Increased trulicity to 3 mg weekly. ?Discontinued Marcelline Deist. ?Start glargine 30 units daily.  Sent Rx for meter and supplies today.  We will follow-up in 1 month for diabetes recheck. ?

## 2022-02-16 NOTE — Assessment & Plan Note (Signed)
The patient has urinary continence for which she uses supplies due to not being able to go to the bathroom quickly.  She uses pads inside her briefs.  She states that she was told that "when she had a stroke, her bladder also had a stroke." ?

## 2022-02-17 LAB — CERVICOVAGINAL ANCILLARY ONLY
Bacterial Vaginitis (gardnerella): NEGATIVE
Candida Glabrata: POSITIVE — AB
Candida Vaginitis: POSITIVE — AB
Chlamydia: NEGATIVE
Comment: NEGATIVE
Comment: NEGATIVE
Comment: NEGATIVE
Comment: NEGATIVE
Comment: NEGATIVE
Comment: NORMAL
Neisseria Gonorrhea: NEGATIVE
Trichomonas: POSITIVE — AB

## 2022-02-17 LAB — URINALYSIS, ROUTINE W REFLEX MICROSCOPIC
Bilirubin, UA: NEGATIVE
Ketones, UA: NEGATIVE
Nitrite, UA: NEGATIVE
Protein,UA: NEGATIVE
RBC, UA: NEGATIVE
Specific Gravity, UA: >=1.03 — AB (ref 1.005–1.030)
Urobilinogen, Ur: 1 mg/dL (ref 0.2–1.0)
pH, UA: 6.5 (ref 5.0–7.5)

## 2022-02-17 LAB — MICROSCOPIC EXAMINATION
Casts: NONE SEEN /lpf
RBC, Urine: NONE SEEN /hpf (ref 0–2)

## 2022-02-18 ENCOUNTER — Telehealth: Payer: Self-pay

## 2022-02-18 NOTE — Telephone Encounter (Signed)
Pt for pt ( TRULICITY ) came through on cover my meds was submitted with office notes and labs .. awaiting approval or denial ... ? ? ? ? ? ? ? ? ? ? ? ?........................................................................... ? Pa for pt  ( VIT D3)  came through on cover my meds was submitted  with office notes .. awaiting approval or denial  ?

## 2022-02-18 NOTE — Telephone Encounter (Signed)
Pt is requesting a call back .. she is wanting to know if her results from her last ov has came back as of yet  ?

## 2022-02-19 ENCOUNTER — Other Ambulatory Visit: Payer: Self-pay | Admitting: Internal Medicine

## 2022-02-19 MED ORDER — METRONIDAZOLE 500 MG PO TABS: 500.0000 mg | ORAL_TABLET | Freq: Two times a day (BID) | ORAL | 0 refills | Status: AC

## 2022-02-19 NOTE — Addendum Note (Signed)
Addended by: Riesa Pope on: 02/19/2022 10:02 AM ? ? Modules accepted: Orders ? ?

## 2022-02-19 NOTE — Progress Notes (Signed)
EPIC Encounter for ICM Monitoring ? ?Patient Name: [name] is a 56 y.o. female ?Date: 02/19/2022 ?Primary Care Physican: Merrilyn Puma, MD ?Primary Cardiologist: Shirlee Latch ?Electrophysiologist: Graciela Husbands ?Bi-V Pacing: 98%         ?09/24/2021 Office Weight: 250 lbs  ?  ?      Transmission reviewed.  ?  ?Optivol thoracic impedance suggesting normal fluid levels but was suggesting possible fluid accumulation from 12/01/2021 - 02/08/2022. ?  ?Prescribed: ?Furosemide 40 mg take 1 tablet by mouth daily ?Potassium 20 mEq take 1 tablet by mouth daily. ?Spironolactone 50 mg take 1 tablet by mouth daily. ?  ?Labs:  ?07/17/2021 Creatinine 0.86, BUN 12, Potassium 3.8, Sodium 137, GFR >60 ?04/14/2021 Creatinine 0.82, BUN <5, Potassium 3.6, Sodium 139, GFR >60  ?A complete set of results can be found in Results Review. ?  ?Recommendations:  No changes. ?  ?Follow-up plan: ICM clinic phone appointment on 03/22/2022.   91 day device clinic remote transmission 03/01/2022.   ?  ?EP/Cardiology Office Visits:  None ?  ?Copy of ICM check sent to Dr. Graciela Husbands.   ? ?3 month ICM trend: 02/15/2022. ? ? ? ?12-14 Month ICM trend:  ? ? ? ?[name], RN ?02/19/2022 ?2:16 PM ? ?

## 2022-02-20 ENCOUNTER — Other Ambulatory Visit (HOSPITAL_COMMUNITY): Payer: Self-pay | Admitting: Cardiology

## 2022-02-20 ENCOUNTER — Other Ambulatory Visit: Payer: Self-pay | Admitting: Student

## 2022-02-20 DIAGNOSIS — N3946 Mixed incontinence: Secondary | ICD-10-CM

## 2022-02-20 NOTE — Progress Notes (Signed)
Internal Medicine Clinic Attending ? ?I saw and evaluated the patient.  I personally confirmed the key portions of the history and exam documented by Dr. Lorin Glass and I reviewed pertinent patient test results.  The assessment, diagnosis, and plan were formulated together and I agree with the documentation in the resident?s note. ? ?Wet prep positive for trichomonas and C. Glabrata. Dr. Beryle Beams reached out to patient and sent and rx for metronidazole. If vaginitis symptoms continue despite treatment for trich, recommend intra vaginal boric acid for c. Glabrata.   ?

## 2022-02-22 NOTE — Telephone Encounter (Signed)
Call placed to [facility] x 2. Line goes directly to VM stating they are not currently accepting calls. ?

## 2022-02-22 NOTE — Telephone Encounter (Signed)
Refill Request Clarification needed ? ?Dulaglutide (TRULICITY) 1.5 0000000 SOPN ? ?Please call the pharmacy ? ?[place], [place] (Ph: [phone]) ?

## 2022-02-22 NOTE — Telephone Encounter (Signed)
DECISION  FOR TRULICITY  ? ? ? ? APPROVED : start date 02/19/2022 ? End date 02/19/2023 ? ? ? ? ? ? ? ? ? ? ( COPY SENT TO PHARMACY ALSO )  ?

## 2022-02-22 NOTE — Telephone Encounter (Signed)
DECISION  for VIT D3  ? ? DENIED  for the following reason  ? ? ?Denied today ?Denied. Unable to approve VITAMIN D3 Tablet 25 MCG(1000 UT). Non-formulary vitamins are excluded from pharmacy benefit coverage per [employer] Evidence of Coverage document. ?Drug ?Vitamin D3 25 MCG(1000 UT) tablets ?Form ?[employer] Electronic Prior Authorization Form Roselee Culver) 2017 NCPDP ? ? ? ? ? ( PROVIDER NOTIFIED )  ?

## 2022-03-01 ENCOUNTER — Ambulatory Visit (INDEPENDENT_AMBULATORY_CARE_PROVIDER_SITE_OTHER): Payer: Medicaid Other

## 2022-03-01 DIAGNOSIS — I428 Other cardiomyopathies: Secondary | ICD-10-CM | POA: Diagnosis not present

## 2022-03-01 LAB — CUP PACEART REMOTE DEVICE CHECK
Battery Remaining Longevity: 44 mo
Battery Voltage: 2.97 V
Brady Statistic AP VP Percent: 0 %
Brady Statistic AP VS Percent: 0 %
Brady Statistic AS VP Percent: 98.65 %
Brady Statistic AS VS Percent: 1.35 %
Brady Statistic RA Percent Paced: 0 %
Brady Statistic RV Percent Paced: 49.28 %
Date Time Interrogation Session: 20230320001702
HighPow Impedance: 53 Ohm
Implantable Lead Implant Date: 20140910
Implantable Lead Implant Date: 20140910
Implantable Lead Implant Date: 20140910
Implantable Lead Location: 753858
Implantable Lead Location: 753859
Implantable Lead Location: 753860
Implantable Lead Model: 4298
Implantable Lead Model: 5076
Implantable Lead Model: 6935
Implantable Pulse Generator Implant Date: 20220406
Lead Channel Impedance Value: 160.941
Lead Channel Impedance Value: 160.941
Lead Channel Impedance Value: 168.889
Lead Channel Impedance Value: 171 Ohm
Lead Channel Impedance Value: 180 Ohm
Lead Channel Impedance Value: 304 Ohm
Lead Channel Impedance Value: 342 Ohm
Lead Channel Impedance Value: 342 Ohm
Lead Channel Impedance Value: 380 Ohm
Lead Channel Impedance Value: 380 Ohm
Lead Channel Impedance Value: 399 Ohm
Lead Channel Impedance Value: 399 Ohm
Lead Channel Impedance Value: 456 Ohm
Lead Channel Impedance Value: 570 Ohm
Lead Channel Impedance Value: 570 Ohm
Lead Channel Impedance Value: 589 Ohm
Lead Channel Impedance Value: 589 Ohm
Lead Channel Impedance Value: 627 Ohm
Lead Channel Pacing Threshold Amplitude: 0.5 V
Lead Channel Pacing Threshold Amplitude: 0.625 V
Lead Channel Pacing Threshold Amplitude: 1 V
Lead Channel Pacing Threshold Pulse Width: 0.4 ms
Lead Channel Pacing Threshold Pulse Width: 0.4 ms
Lead Channel Pacing Threshold Pulse Width: 0.4 ms
Lead Channel Sensing Intrinsic Amplitude: 16.25 mV
Lead Channel Sensing Intrinsic Amplitude: 16.25 mV
Lead Channel Sensing Intrinsic Amplitude: 3.25 mV
Lead Channel Sensing Intrinsic Amplitude: 3.25 mV
Lead Channel Setting Pacing Amplitude: 1.5 V
Lead Channel Setting Pacing Amplitude: 2 V
Lead Channel Setting Pacing Amplitude: 4 V
Lead Channel Setting Pacing Pulse Width: 0.4 ms
Lead Channel Setting Pacing Pulse Width: 0.4 ms
Lead Channel Setting Sensing Sensitivity: 0.3 mV

## 2022-03-02 ENCOUNTER — Telehealth: Payer: Self-pay

## 2022-03-02 NOTE — Telephone Encounter (Signed)
PA  for pt ( SOLIFENACIN SUCCINATE 10MG  TAB ) came through on cover my meds was submitted .. awaiting approval or denial  ? ? ? ? UPDATE : ? ? ? DENIED  ? ? ?Prior Authorization Request was Denied. ?Drug ?Solifenacin Succinate 10MG  tablets ?Form ?[employer] Electronic Prior Authorization Form ([employer]) 2017 NCPDP ?Original Claim Info ?75 PA RQRD. CALL [id number] LIMITATIONS EXCEEDED For [employer] Coupon Price of: $21.95 submit to BIN: [id number] PCN: CP Group: [id number] --Service provided at no cost and no switch fee to the pharmacy-- ?

## 2022-03-08 ENCOUNTER — Ambulatory Visit: Payer: Medicaid Other | Admitting: Behavioral Health

## 2022-03-08 ENCOUNTER — Other Ambulatory Visit (HOSPITAL_COMMUNITY): Payer: Self-pay | Admitting: Adult Health

## 2022-03-08 ENCOUNTER — Other Ambulatory Visit: Payer: Self-pay | Admitting: Student in an Organized Health Care Education/Training Program

## 2022-03-08 DIAGNOSIS — I5042 Chronic combined systolic (congestive) and diastolic (congestive) heart failure: Secondary | ICD-10-CM

## 2022-03-08 DIAGNOSIS — F331 Major depressive disorder, recurrent, moderate: Secondary | ICD-10-CM

## 2022-03-08 DIAGNOSIS — F419 Anxiety disorder, unspecified: Secondary | ICD-10-CM

## 2022-03-08 NOTE — BH Specialist Note (Signed)
Integrated Behavioral Health via Telemedicine Visit ? ?03/08/2022 ?[name] ?[id number] ? ?Number of Integrated Behavioral Health Clinician visits: 14 ?Session Start time: 1100 ?Session End time: 1130 ?Total time in minutes: 30 min ? ?Referring Provider: Dr. Everardo Pacific, MD ?Patient/Family location: Pt is in a [facility] w/privacy ?[facility] Provider location: [facility] Office ?All persons participating in visit: Pt & Clinician ?Types of Service: Individual psychotherapy ? ?I connected with [name] and/or [name]'s  self  via  Telephone or [employer]  (Video is Surveyor, mining) and verified that I am speaking with the correct person using two identifiers. Discussed confidentiality: Yes  ? ?I discussed the limitations of telemedicine and the availability of in person appointments.  Discussed there is a possibility of technology failure and discussed alternative modes of communication if that failure occurs. ? ?I discussed that engaging in this telemedicine visit, they consent to the provision of behavioral healthcare and the services will be billed under their insurance. ? ?Patient and/or legal guardian expressed understanding and consented to Telemedicine visit: Yes  ? ?Presenting Concerns: ?Patient and/or family reports the following symptoms/concerns: heightened anx/dep due to Housing instability & her search for a home; Pt has reliable transportation. ?Duration of problem: over a year; Severity of problem: moderate ? ?Patient and/or Family's Strengths/Protective Factors: ?Social and Emotional competence and Sense of purpose ? ?Goals Addressed: ?Patient will: ? Reduce symptoms of: anxiety, depression, mood instability, and stress  ? Increase knowledge and/or ability of: coping skills, healthy habits, and stress reduction  ? Demonstrate ability to: Increase healthy adjustment to current life circumstances and Increase adequate support systems for  patient/family ? ?Progress towards Goals: ?Ongoing ? ?Interventions: ?Interventions utilized:  Motivational Interviewing and Supportive Counseling ?Standardized Assessments completed:  screeners prn ? ?Patient and/or Family Response: Pt is receptive to call & requests future appt for mental health wellness ? ?Assessment: ?Patient currently experiencing constant levels of anx/dep dealing w/her 3rd episode of unstable housing.  ? ?Patient may benefit from cont'd Cslg for mental health wellness. ? ?Plan: ?Follow up with behavioral health clinician on : 2-3 wks on telehealth for 30 min ?Behavioral recommendations: Resiliency discussion; reliance on faith ?Referral(s): Integrated [employer] (In Clinic) ? ?I discussed the assessment and treatment plan with the patient and/or parent/guardian. They were provided an opportunity to ask questions and all were answered. They agreed with the plan and demonstrated an understanding of the instructions. ?  ?They were advised to call back or seek an in-person evaluation if the symptoms worsen or if the condition fails to improve as anticipated. ? ?Deneise Lever, LMFT ?

## 2022-03-09 ENCOUNTER — Other Ambulatory Visit: Payer: Self-pay | Admitting: Student

## 2022-03-09 DIAGNOSIS — E1142 Type 2 diabetes mellitus with diabetic polyneuropathy: Secondary | ICD-10-CM

## 2022-03-10 ENCOUNTER — Other Ambulatory Visit: Payer: Self-pay

## 2022-03-10 DIAGNOSIS — E1142 Type 2 diabetes mellitus with diabetic polyneuropathy: Secondary | ICD-10-CM

## 2022-03-11 ENCOUNTER — Telehealth: Payer: Self-pay | Admitting: *Deleted

## 2022-03-11 MED ORDER — TRULICITY 1.5 MG/0.5ML ~~LOC~~ SOAJ: 3.0000 mg | SUBCUTANEOUS | 2 refills | Status: DC

## 2022-03-11 NOTE — Progress Notes (Signed)
Remote ICD transmission.   

## 2022-03-11 NOTE — Telephone Encounter (Signed)
Received call from [facility] at [employer]. Rx for Trulicity needs to be resent for 3 mg dose as Sig states inject 3 mg once weekly. ?

## 2022-03-22 ENCOUNTER — Ambulatory Visit (INDEPENDENT_AMBULATORY_CARE_PROVIDER_SITE_OTHER): Payer: Medicaid Other

## 2022-03-22 ENCOUNTER — Other Ambulatory Visit: Payer: Self-pay | Admitting: Student

## 2022-03-22 DIAGNOSIS — I5042 Chronic combined systolic (congestive) and diastolic (congestive) heart failure: Secondary | ICD-10-CM | POA: Diagnosis not present

## 2022-03-22 DIAGNOSIS — Z95810 Presence of automatic (implantable) cardiac defibrillator: Secondary | ICD-10-CM | POA: Diagnosis not present

## 2022-03-24 ENCOUNTER — Telehealth: Payer: Self-pay

## 2022-03-24 NOTE — Progress Notes (Signed)
EPIC Encounter for ICM Monitoring ? ?Patient Name: [name] is a 56 y.o. female ?Date: 03/24/2022 ?Primary Care Physican: Merrilyn Puma, MD ?Primary Cardiologist: Shirlee Latch ?Electrophysiologist: Graciela Husbands ?Bi-V Pacing: 98.5%         ?02/16/2022 Office Weight: 256 lbs  ?  ?      Attempted call to patient and unable to reach.   Transmission reviewed.  ?  ?Optivol thoracic impedance suggesting normal fluid levels. ?  ?Prescribed: ?Furosemide 40 mg take 1 tablet by mouth daily ?Potassium 20 mEq take 1 tablet by mouth daily. ?Spironolactone 50 mg take 1 tablet by mouth daily. ?  ?Labs:  ?07/17/2021 Creatinine 0.86, BUN 12, Potassium 3.8, Sodium 137, GFR >60 ?04/14/2021 Creatinine 0.82, BUN <5, Potassium 3.6, Sodium 139, GFR >60  ?A complete set of results can be found in Results Review. ?  ?Recommendations:  Unable to reach.   ?  ?Follow-up plan: ICM clinic phone appointment on 04/26/2022.   91 day device clinic remote transmission 05/31/2022.   ?  ?EP/Cardiology Office Visits:  04/14/2022 with Dr Graciela Husbands. ?  ?Copy of ICM check sent to Dr. Graciela Husbands.    ? ?3 month ICM trend: 03/22/2022. ? ? ? ?12-14 Month ICM trend:  ? ? ? ?[name], RN ?03/24/2022 ?1:09 PM ? ?

## 2022-03-24 NOTE — Telephone Encounter (Signed)
Remote ICM transmission received.  Attempted call to patient regarding ICM remote transmission and no answer.  

## 2022-03-25 MED ORDER — TRULICITY 3 MG/0.5ML ~~LOC~~ SOAJ: 3.0000 mg | SUBCUTANEOUS | 2 refills | Status: DC

## 2022-03-25 NOTE — Addendum Note (Signed)
Addended by: [name] on: 03/25/2022 01:18 PM ? ? Modules accepted: Orders ? ?

## 2022-03-26 ENCOUNTER — Other Ambulatory Visit: Payer: Self-pay

## 2022-03-26 ENCOUNTER — Other Ambulatory Visit (HOSPITAL_COMMUNITY): Payer: Self-pay | Admitting: Cardiology

## 2022-03-26 DIAGNOSIS — E1140 Type 2 diabetes mellitus with diabetic neuropathy, unspecified: Secondary | ICD-10-CM

## 2022-03-26 NOTE — Telephone Encounter (Signed)
Pt is requesting rx for insulin to be sent in for the pen needles instead of the valium with the syringes, she is also requesting her meds to be sent in a pill pack instead the bottles. ?

## 2022-03-29 ENCOUNTER — Other Ambulatory Visit (HOSPITAL_COMMUNITY): Payer: Self-pay | Admitting: Adult Health

## 2022-03-29 DIAGNOSIS — I5042 Chronic combined systolic (congestive) and diastolic (congestive) heart failure: Secondary | ICD-10-CM

## 2022-03-29 DIAGNOSIS — I10 Essential (primary) hypertension: Secondary | ICD-10-CM

## 2022-03-29 MED ORDER — BASAGLAR KWIKPEN 100 UNIT/ML ~~LOC~~ SOPN: 30.0000 [IU] | PEN_INJECTOR | Freq: Every day | SUBCUTANEOUS | 0 refills | Status: DC

## 2022-04-05 ENCOUNTER — Ambulatory Visit: Payer: Medicaid Other | Admitting: Behavioral Health

## 2022-04-05 DIAGNOSIS — F331 Major depressive disorder, recurrent, moderate: Secondary | ICD-10-CM

## 2022-04-05 DIAGNOSIS — F419 Anxiety disorder, unspecified: Secondary | ICD-10-CM

## 2022-04-05 NOTE — BH Specialist Note (Signed)
Integrated Behavioral Health Follow Up In-Person Visit ? ?MRN: [id number] ?Name: [name] ? ?Number of Integrated Behavioral Health Clinician visits: 15 ?Session Start time: 1330 ?Session End time: 1400 ?Total time in minutes: 30 min ? ?Types of Service: Individual psychotherapy ? ?Interpretor:No. Interpretor Name and Language: n/a ? ?Subjective: ?[name] is a 56 y.o. female accompanied by  self ?Patient was referred by Dr. Everardo Pacific, MD for sdoh & mental health wellness. ?Patient reports the following symptoms/concerns: Pt presents F2F today w/her CNA Fayrene Fearing transporting her to [facility]. She is managing in the [place] as best she can. Her Son Denver Jacobs is actively searching for suitable housing. ?Duration of problem: over one year of being in & out of [place] housing situations; Severity of problem: moderate ? ?Objective: ?Mood: Depressed and tearful  and Affect: Appropriate ?Risk of harm to self or others: No plan to harm self or others ? ?Life Context: ?Family and Social: Pt has a strong F Syst that can be both helpful & loving & also demanding ?School/Work: Pt is not working & does not attend Sch ?Self-Care: Pt is managing her housing situation w/emot'l difficulty; she is grateful for her Son [name]'s help ?Life Changes: Over the past year, Pt has been in a Hotel for temp housing twice. The situation has been a struggle. ? ?Patient and/or Family's Strengths/Protective Factors: ?Social and Emotional competence and Sense of purpose, Pt possesses a great deal of inner strength ? ?Goals Addressed: ?Patient will: ? Reduce symptoms of: anxiety, depression, and stress  ? Increase knowledge and/or ability of: coping skills, healthy habits, stress reduction, and resiliency   ? Demonstrate ability to: Increase healthy adjustment to current life circumstances and Increase adequate support systems for patient/family ? ?Progress towards Goals: ?Ongoing ? ?Interventions: ?Interventions utilized:  Motivational Interviewing  and Supportive Counseling ?Standardized Assessments completed: Not Needed ? ?Patient and/or Family Response: Pt is uncertain of format of visit today, although we always do telemental Health visits, Pt is present today. Pt scheduled for future appt this morning after visit completed. ? ?Patient Centered Plan: ?Patient is on the following Treatment Plan(s): Pt will attend therapy sessions for support, encouragement, & to maintain her sense of self-reliance & independence ?Assessment: ?Patient currently experiencing sorrow over the situation & its ability to change.  ? ?Patient may benefit from cont'd support in psychotherapy. ? ?Plan: ?Follow up with behavioral health clinician on : 2-3 wks for 30 min telehealth session ?Behavioral recommendations: Cont to reach out to Family members that are helpful & understanding ?Referral(s): Integrated [employer] (In Clinic), flyer re: Parking @ [facility] for the unhoused who have a car ?"From scale of 1-10, how likely are you to follow plan?": 7 ? ?Deneise Lever, LMFT ? ? ?

## 2022-04-12 ENCOUNTER — Encounter: Payer: Medicaid Other | Admitting: Internal Medicine

## 2022-04-14 ENCOUNTER — Encounter: Payer: Self-pay | Admitting: Student

## 2022-04-14 ENCOUNTER — Encounter: Payer: Medicaid Other | Admitting: Internal Medicine

## 2022-04-17 ENCOUNTER — Other Ambulatory Visit: Payer: Self-pay | Admitting: Student

## 2022-04-17 DIAGNOSIS — I69359 Hemiplegia and hemiparesis following cerebral infarction affecting unspecified side: Secondary | ICD-10-CM

## 2022-04-19 ENCOUNTER — Other Ambulatory Visit (HOSPITAL_COMMUNITY): Payer: Self-pay | Admitting: Cardiology

## 2022-04-22 ENCOUNTER — Other Ambulatory Visit: Payer: Self-pay | Admitting: Student

## 2022-04-22 DIAGNOSIS — I4891 Unspecified atrial fibrillation: Secondary | ICD-10-CM

## 2022-04-26 ENCOUNTER — Ambulatory Visit (INDEPENDENT_AMBULATORY_CARE_PROVIDER_SITE_OTHER): Payer: 59

## 2022-04-26 ENCOUNTER — Encounter: Payer: 59 | Admitting: Internal Medicine

## 2022-04-26 DIAGNOSIS — Z95810 Presence of automatic (implantable) cardiac defibrillator: Secondary | ICD-10-CM

## 2022-04-26 DIAGNOSIS — I5042 Chronic combined systolic (congestive) and diastolic (congestive) heart failure: Secondary | ICD-10-CM | POA: Diagnosis not present

## 2022-04-26 NOTE — Telephone Encounter (Signed)
Called pt who stated she only take 1 tablet daily. Pharmacy had requested early refill - "This prescription was filled on 04/06/2022. Any refills authorized will be placed on file." Per the pharmacy. ?

## 2022-04-27 ENCOUNTER — Institutional Professional Consult (permissible substitution): Payer: Medicaid Other | Admitting: Behavioral Health

## 2022-04-27 ENCOUNTER — Encounter: Payer: Self-pay | Admitting: Internal Medicine

## 2022-04-27 ENCOUNTER — Ambulatory Visit (INDEPENDENT_AMBULATORY_CARE_PROVIDER_SITE_OTHER): Payer: PRIVATE HEALTH INSURANCE | Admitting: Internal Medicine

## 2022-04-27 VITALS — BP 157/105 | HR 99 | Temp 97.9°F | Ht 69.0 in | Wt 232.6 lb

## 2022-04-27 DIAGNOSIS — E1165 Type 2 diabetes mellitus with hyperglycemia: Secondary | ICD-10-CM

## 2022-04-27 DIAGNOSIS — F331 Major depressive disorder, recurrent, moderate: Secondary | ICD-10-CM | POA: Diagnosis not present

## 2022-04-27 DIAGNOSIS — R5383 Other fatigue: Secondary | ICD-10-CM | POA: Insufficient documentation

## 2022-04-27 DIAGNOSIS — E1140 Type 2 diabetes mellitus with diabetic neuropathy, unspecified: Secondary | ICD-10-CM | POA: Diagnosis not present

## 2022-04-27 DIAGNOSIS — Z0000 Encounter for general adult medical examination without abnormal findings: Secondary | ICD-10-CM

## 2022-04-27 DIAGNOSIS — Z794 Long term (current) use of insulin: Secondary | ICD-10-CM | POA: Diagnosis not present

## 2022-04-27 DIAGNOSIS — G4733 Obstructive sleep apnea (adult) (pediatric): Secondary | ICD-10-CM | POA: Diagnosis not present

## 2022-04-27 DIAGNOSIS — I10 Essential (primary) hypertension: Secondary | ICD-10-CM | POA: Diagnosis not present

## 2022-04-27 DIAGNOSIS — K5901 Slow transit constipation: Secondary | ICD-10-CM | POA: Diagnosis not present

## 2022-04-27 HISTORY — DX: Other fatigue: R53.83

## 2022-04-27 MED ORDER — ESCITALOPRAM OXALATE 10 MG PO TABS: 10.0000 mg | ORAL_TABLET | Freq: Every day | ORAL | 1 refills | Status: DC

## 2022-04-27 NOTE — Patient Instructions (Signed)
Thank you, Ms.Ceasar Lund for allowing [place] to provide your care today. Today we discussed: ? ?Constipation: take your linzess, stool softener tablet and ADD Miralax once in the morning and once in the evening ? ?Fatigue: I suspect your fatigue is due to your obstructive sleep apnea or due to depression, we will check some lab work today to see if there are any other reasons you may be feeling so tired. I will see who did your first sleep study.  ? ?Depression: We will start Lexapro 10mg  once a day for depression. Please continue to follow with Dr. Theodis Shove, your next appointment is 6/13. See if you can schedule for another May appointment since yours was canceled today. ? ?Diabetes: Please ask for help checking your blood sugars at home. Check a fasting blood sugar once in the morning before breakfast. Please come back next month to recheck your Hgb A1c. Make sure you are taking your diabetes medications as prescribed.  ? ? ?My Chart Access: ?[email or web address] ? ?Please follow-up in 1 months. ? ?Please make sure to arrive 15 minutes prior to your next appointment. If you arrive late, you may be asked to reschedule.  ?  ?We look forward to seeing you next time. Please call our clinic at [phone] if you have any questions or concerns. The best time to call is Monday-Friday from 9am-4pm, but there is someone available 24/7. If after hours or the weekend, call the main hospital number and ask for the Internal Medicine Resident On-Call. If you need medication refills, please notify your pharmacy one week in advance and they will send [place] a request. ?  ?Thank you for letting us take part in your care. Wishing you the best! ? ?Wayland Denis, MD ?04/27/2022, 11:09 AM ?IM Resident, PGY-1 ? ?

## 2022-04-27 NOTE — Assessment & Plan Note (Signed)
Patient has chronic constipation, takes linzess and generic stool softener for this. Reports has not had a BM in 12d. Not recently on opioid pain medications. Discussed adding Miralax OTC BID, eating diet high in fiber and drinking water. ?

## 2022-04-27 NOTE — Progress Notes (Addendum)
CC: fatigue, thirsty, constipation  HPI:  Ms.Honest E Twichell is a 56 y.o. female with a past medical history stated below and presents today for fatigue. Please see problem based assessment and plan for additional details.  Past Medical History:  Diagnosis Date   Allergic rhinitis    Arthritis    "hips, back, legs, arms" (07/04/2014)   Asthma    hx   Automatic implantable cardioverter-defibrillator in situ    Calcifying tendinitis of shoulder    Chronic combined systolic and diastolic CHF (congestive heart failure) (HCC)    EF 40-45% by echo 12/06/2012   Chronic diastolic heart failure ([place])     Primarily diastolic CHF: Likely due to uncontrolled HTN. Last echo (8/12) with EF 45-50%, mild to moderate LVH with some asymmetric septal hypertrophy, RV normal size and systolic function. EF 50-55% by LV-gram in 6/12.    Chronic lower back pain    secondary to DJD, obsetiy, hip problems. Followed by Dr. Oval Linsey (pain management)   Coronary artery disease    questionable. [place] 05/2011 showing normal coronaries // Followed at [facility], Dr. Aundra Dubin   Degeneration of lumbar or lumbosacral intervertebral disc    DJD (degenerative joint disease) of hip    right sided   Frequent UTI    GERD (gastroesophageal reflux disease)    HLD (hyperlipidemia)    Hypertension    Poorly controlled. Has had HTN since age 28. Angioedema with ACEI.  24 Hr urine and renal arterial dopplers ordered . . . Never done   LBBB (left bundle branch block)    Liver disease    Morbid obesity ([place])    NICM (nonischemic cardiomyopathy) ([place])    EF 45-50% in 8/12, cath 6/12 showed normal coronaries, EF 50-55% by LV gram   OSA on CPAP    sleep study in 8/12 showed moderate to severe OSA requiring CPAP   Polyneuropathy in diabetes(357.2)    Presence of permanent cardiac pacemaker    Seizures ([place])    last 3 months   Shortness of breath    none now   Stroke [facility]) 12/2013   "my left side is paralyzed"  (07/04/2014)   Thoracic or lumbosacral neuritis or radiculitis, unspecified    Type II diabetes mellitus ([place]) DX: 2002    Current Outpatient Medications on File Prior to Visit  Medication Sig Dispense Refill   Accu-Chek FastClix Lancets MISC Check blood sugar 2 times a day 102 each 12   amLODipine (NORVASC) 10 MG tablet Take 1 tablet (10 mg total) by mouth daily. NEEDS FOLLOW UP APPOINTMENT FOR MORE REFILLS 90 tablet 0   atorvastatin (LIPITOR) 40 MG tablet Take 1 tablet (40 mg total) by mouth daily. 90 tablet 2   Blood Glucose Monitoring Suppl (ACCU-CHEK GUIDE) w/Device KIT Check blood  sugars daily 1 kit 0   calamine lotion Apply 1 application topically as needed for itching. 120 mL 0   cholecalciferol (VITAMIN D3) 25 MCG (1000 UNIT) tablet Take 1 tablet (1,000 Units total) by mouth daily. 90 tablet 0   cyanocobalamin 1000 MCG tablet Take 1 tablet by mouth daily.     cyclobenzaprine (FLEXERIL) 5 MG tablet TAKE ONE TABLET BY MOUTH THREE TIMES DAILY AS NEEDED FOR MUSCLE SPASMS 30 tablet 1   diclofenac Sodium (VOLTAREN) 1 % GEL APPLY 4 GRAMS TOPICALLY TO LEFT KNEE FOUR TIMES DAILY. 300 g 2   Dulaglutide (TRULICITY) 3 KW/4.0XB SOPN Inject 3 mg as directed once a week. 9 mL 2  Ergocalciferol 10 MCG (400 UNIT) TABS Take 800 Units by mouth daily. 30 tablet 3   fluticasone (CUTIVATE) 0.05 % cream APPLY TO THE AFFECTED AREA(S) TWICE DAILY 30 g 6   folic acid (FOLVITE) 1 MG tablet TAKE ONE TABLET BY MOUTH EVERY DAY 90 tablet 1   furosemide (LASIX) 40 MG tablet Take 1 tablet (40 mg total) by mouth daily. 90 tablet 3   gabapentin (NEURONTIN) 300 MG capsule TAKE ONE CAPSULE BY MOUTH TWICE DAILY 90 capsule 1   glucose blood (ACCU-CHEK GUIDE) test strip USE TO CHECK BLOOD SUGAR TWICE DAILY 100 strip 10   hydrALAZINE (APRESOLINE) 25 MG tablet Take 1 tablet (25 mg total) by mouth 3 (three) times daily. 90 tablet 11   Insulin Glargine (BASAGLAR KWIKPEN) 100 UNIT/ML Inject 30 Units into the skin daily. 15 mL  0   isosorbide mononitrate (IMDUR) 60 MG 24 hr tablet TAKE ONE AND ONE-HALF TABLETS BY MOUTH EVERY DAY 135 tablet 2   levETIRAcetam (KEPPRA) 500 MG tablet Take 1 tablet (500 mg total) by mouth 2 (two) times daily. 180 tablet 3   LINZESS 290 MCG CAPS capsule TAKE 1 CAPSULE (290 MCG TOTAL) BY MOUTH DAILY AS NEEDED (CONSTIPATION). 30 capsule 2   metoprolol succinate (TOPROL-XL) 100 MG 24 hr tablet Take 1 tablet (100 mg total) by mouth daily. Absolute last refill without office visit please call [phone] to schedule 30 tablet 0   nystatin powder Apply 1 application. topically 3 (three) times daily. 15 g 0   pantoprazole (PROTONIX) 40 MG tablet TAKE ONE TABLET BY MOUTH EVERY DAY 90 tablet 3   potassium chloride SA (KLOR-CON M20) 20 MEQ tablet Take 1 tablet (20 mEq total) by mouth daily. 90 tablet 3   solifenacin (VESICARE) 10 MG tablet TAKE ONE TABLET BY MOUTH EVERY DAY 30 tablet 5   spironolactone (ALDACTONE) 100 MG tablet Take 1 tablet (100 mg total) by mouth daily. 30 tablet 2   STOOL SOFTENER/LAXATIVE 50-8.6 MG tablet TAKE ONE TABLET BY MOUTH AT BEDTIME AS NEEDED FOR MILD CONSTIPATION 30 tablet 0   XARELTO 20 MG TABS tablet TAKE ONE TABLET BY MOUTH EVERY DAY WITH SUPPER 30 tablet 2   No current facility-administered medications on file prior to visit.    Family History  Problem Relation Age of Onset   Heart disease Mother 30       Died of MI at age 17 yo   Kidney disease Mother        requiring dialysis   Congestive Heart Failure Mother    Heart disease Father 47       MI age 65yo requiring stenting   Diabetes Father    Glaucoma Father    Heart disease Paternal Grandmother        requiring pacemaker.   Heart disease Paternal Grandfather 75       Died of MI at possibly age 31-53yo   Stroke Paternal Grandfather    Diabetes Brother    Heart disease Brother 53       MI at age 42 years old   Breast cancer Paternal Aunt    Breast cancer Maternal Grandmother     Review of  Systems: ROS negative except for what is noted on the assessment and plan.  Vitals:   04/27/22 1025  BP: (!) 157/105  Pulse: 99  Temp: 97.9 F (36.6 C)  TempSrc: Oral  SpO2: 100%  Weight: 232 lb 9.6 oz (105.5 kg)  Height: $Remove'5\' 9"'cUlvKsB$  (1.753 m)  Physical Exam: General: Chronically ill appearing black female, overweight, NAD HENT: normocephalic, atraumatic EYES: conjunctiva non-erythematous, no scleral icterus CV: regular rate, normal rhythm, no murmurs, rubs, gallops. Trace LEE bilaterally. Pulmonary: normal work of breathing on RA, lungs clear to auscultation, no rales, wheezes, rhonchi Abdominal: non-distended, soft, non-tender to palpation, normal BS Skin: Warm and dry, no rashes or lesions Neurological: MS: awake, alert and oriented x3, normal speech and fund of knowledge Motor: LHB weakness, LFD Psych: normal affect    Assessment & Plan:   Fatigue Patient presents for urgent care appt for primary complaint of fatigue. She reports several week history of fatigue with daytime sleepiness, will fall asleep in her chair during the day. Reports variable sleep schedule, unable to elaborate. Patient follows with [place] in clinic for multiple chronic conditions. Notably, patient has been following with Dr. Theodis Shove for long history of depression, currently under financial and emotional stress due to her numerous health concerns and living in a hotel recently. PHQ-9 23 today. Also has a history of OSA, sleep study 2012 showed mild-mod sleep apnea. Patient has not been using CPAP, has no machine. Repeat study ordered 2022 by [facility] though never completed. STOP bang today 5/8 high risk for OSA. Patient also has poorly controlled T2DM, not checking CBGs at home, had recent increase in Trulicity and started on glargine 30u daily during same appt. Patient also follows with cardiology for HTN, A fib, CHF though denies chest pain, dizziness, lightheadedness, palpitations, BP elevated today. She denies  melena, hematochezia, vaginal bleeding, hematuria.   On assessment, suspect patient's fatigue multifactorial, likely untreated OSA significantly contributing, depressed mood significantly contributing. Will check TSH to rule out thyroid disease (only sxs constipation and fatigue), will check CBC for anemia.   Plan:  -TSH -CBC -Referral for sleep study -Counseling and starting Lexapro $RemoveBeforeDE'10mg'ETigBAvVnoRQeEk$  daily for depression  ADDENDUM: TSH and Hgb normal, iron profile fits with anemia of chronic disease. She did have mild leukocytosis with neutrophil shift though had no fevers, or localizing infectious symptoms on ROS. Consider checking vitamin D level at next appointment.  Depression Patient has long history of depression, follows with Dr. Theodis Shove. PHQ9 score 23 today. She lists her chronic medical conditions and housing situation as factors contributing to depressed mood. Patient endorses rare thoughts of self harm though no plan to act on these passive thoughts. Does not wish to die. In the past she has tried Prozac (caused irritability), Zoloft ("didn't like it"), Celexa ("not sure" why she stopped it).   On assessment patient has severe depression. Follows with Dr. Theodis Shove for counseling. She would benefit from restarting antidepressant and patient amendable to starting Lexapro. Plan: -Continue visits with Dr. Theodis Shove -Start Lexapro $RemoveBefore'10mg'aYmmVsHQXOavv$  daily, printout educational information on Lexapro provided -Follow up in one month with plan to increase medication if well tolerated  Poorly controlled type 2 diabetes mellitus with neuropathy ([place]) Patient has history of poorly controlled diabetes, reports not checking her CBGs, feeling tired and weak during the day. Reports too much hand arthritis to use glucometer. Was recently started on glargine and trulicity which she reports taking. Last Hgb A1c 9.4%. Due for Hgb A1c next month. Endorses polydipsia and polyuria.   Discussed long term complications of diabetes  if not well controlled. Discussed asking family members that live with her to assist in checking AM fasting CBGs. Patient to follow up in one month for T2DM follow up appt and Hgb A1c check.  Health care maintenance Please complete foot exam at next  OV. Please discuss colon cancer screening and cervical cancer screening.   Constipation Patient has chronic constipation, takes linzess and generic stool softener for this. Reports has not had a BM in 12d. Not recently on opioid pain medications. Discussed adding Miralax OTC BID, eating diet high in fiber and drinking water.   Patient discussed with Dr. Dionicia Abler, M.D. [facility], PGY-1 Pager: [phone] Date 04/29/2022 Time 11:15 AM

## 2022-04-27 NOTE — Assessment & Plan Note (Signed)
Patient has long history of depression, follows with Dr. Theodis Shove. PHQ9 score 23 today. She lists her chronic medical conditions and housing situation as factors contributing to depressed mood. Patient endorses rare thoughts of self harm though no plan to act on these passive thoughts. Does not wish to die. In the past she has tried Prozac (caused irritability), Zoloft ("didn't like it"), Celexa ("not sure" why she stopped it).  ? ?On assessment patient has severe depression. Follows with Dr. Theodis Shove for counseling. She would benefit from restarting antidepressant and patient amendable to starting Lexapro. ?Plan: ?-Continue visits with Dr. Theodis Shove ?-Start Lexapro 10mg  daily, printout educational information on Lexapro provided ?-Follow up in one month with plan to increase medication if well tolerated ?

## 2022-04-27 NOTE — Assessment & Plan Note (Addendum)
Patient presents for urgent care appt for primary complaint of fatigue. She reports several week history of fatigue with daytime sleepiness, will fall asleep in her chair during the day. Reports variable sleep schedule, unable to elaborate. Patient follows with [place] in clinic for multiple chronic conditions. Notably, patient has been following with Dr. Theodis Shove for long history of depression, currently under financial and emotional stress due to her numerous health concerns and living in a hotel recently. PHQ-9 23 today. Also has a history of OSA, sleep study 2012 showed mild-mod sleep apnea. Patient has not been using CPAP, has no machine. Repeat study ordered 2022 by [facility] though never completed. STOP bang today 5/8 high risk for OSA. Patient also has poorly controlled T2DM, not checking CBGs at home, had recent increase in Trulicity and started on glargine 30u daily during same appt. Patient also follows with cardiology for HTN, A fib, CHF though denies chest pain, dizziness, lightheadedness, palpitations, BP elevated today. She denies melena, hematochezia, vaginal bleeding, hematuria.   On assessment, suspect patient's fatigue multifactorial, likely untreated OSA significantly contributing, depressed mood significantly contributing. Will check TSH to rule out thyroid disease (only sxs constipation and fatigue), will check CBC for anemia.   Plan:  -TSH -CBC -Referral for sleep study -Counseling and starting Lexapro 10mg  daily for depression  ADDENDUM: TSH and Hgb normal, iron profile fits with anemia of chronic disease. She did have mild leukocytosis with neutrophil shift though had no fevers, or localizing infectious symptoms on ROS. Consider checking vitamin D level at next appointment.

## 2022-04-27 NOTE — Assessment & Plan Note (Signed)
Please complete foot exam at next OV. Please discuss colon cancer screening and cervical cancer screening.  ?

## 2022-04-27 NOTE — Assessment & Plan Note (Signed)
Patient has history of poorly controlled diabetes, reports not checking her CBGs, feeling tired and weak during the day. Reports too much hand arthritis to use glucometer. Was recently started on glargine and trulicity which she reports taking. Last Hgb A1c 9.4%. Due for Hgb A1c next month. Endorses polydipsia and polyuria.  ? ?Discussed long term complications of diabetes if not well controlled. Discussed asking family members that live with her to assist in checking AM fasting CBGs. Patient to follow up in one month for T2DM follow up appt and Hgb A1c check. ?

## 2022-04-28 LAB — TSH: TSH: 1.49 u[IU]/mL (ref 0.450–4.500)

## 2022-04-28 LAB — BMP8+ANION GAP
Anion Gap: 18 mmol/L (ref 10.0–18.0)
BUN/Creatinine Ratio: 12 (ref 9–23)
BUN: 13 mg/dL (ref 6–24)
CO2: 22 mmol/L (ref 20–29)
Calcium: 9.7 mg/dL (ref 8.7–10.2)
Chloride: 90 mmol/L — ABNORMAL LOW (ref 96–106)
Creatinine, Ser: 1.09 mg/dL — ABNORMAL HIGH (ref 0.57–1.00)
Glucose: 474 mg/dL — ABNORMAL HIGH (ref 70–99)
Potassium: 4.9 mmol/L (ref 3.5–5.2)
Sodium: 130 mmol/L — ABNORMAL LOW (ref 134–144)
eGFR: 60 mL/min/{1.73_m2} (ref >59)

## 2022-04-28 LAB — CBC WITH DIFFERENTIAL/PLATELET
Basophils Absolute: 0.1 10*3/uL (ref 0.0–0.2)
Basos: 1 %
EOS (ABSOLUTE): 0.1 10*3/uL (ref 0.0–0.4)
Eos: 1 %
Hematocrit: 42.3 % (ref 34.0–46.6)
Hemoglobin: 13.6 g/dL (ref 11.1–15.9)
Immature Grans (Abs): 0 10*3/uL (ref 0.0–0.1)
Immature Granulocytes: 0 %
Lymphocytes Absolute: 2.3 10*3/uL (ref 0.7–3.1)
Lymphs: 18 %
MCH: 26.8 pg (ref 26.6–33.0)
MCHC: 32.2 g/dL (ref 31.5–35.7)
MCV: 83 fL (ref 79–97)
Monocytes Absolute: 1 10*3/uL — ABNORMAL HIGH (ref 0.1–0.9)
Monocytes: 8 %
Neutrophils Absolute: 9.4 10*3/uL — ABNORMAL HIGH (ref 1.4–7.0)
Neutrophils: 72 %
Platelets: 297 10*3/uL (ref 150–450)
RBC: 5.07 x10E6/uL (ref 3.77–5.28)
RDW: 12.5 % (ref 11.7–15.4)
WBC: 13 10*3/uL — ABNORMAL HIGH (ref 3.4–10.8)

## 2022-04-28 LAB — IRON AND TIBC
Iron Saturation: 13 % — ABNORMAL LOW (ref 15–55)
Iron: 43 ug/dL (ref 27–159)
Total Iron Binding Capacity: 322 ug/dL (ref 250–450)
UIBC: 279 ug/dL (ref 131–425)

## 2022-04-28 LAB — FERRITIN: Ferritin: 240 ng/mL — ABNORMAL HIGH (ref 15–150)

## 2022-04-28 NOTE — Progress Notes (Signed)
Internal Medicine Clinic Attending  Case discussed with the resident at the time of the visit.  We reviewed the resident's history and exam and pertinent patient test results.  I agree with the assessment, diagnosis, and plan of care documented in the resident's note.  

## 2022-04-30 NOTE — Progress Notes (Signed)
EPIC Encounter for ICM Monitoring  Patient Name: [name] is a 56 y.o. female Date: 04/30/2022 Primary Care Physican: Merrilyn Puma, MD Primary Cardiologist: Shirlee Latch Electrophysiologist: Joycelyn Schmid Pacing: 98.5%         02/16/2022 Office Weight: 256 lbs          Transmission reviewed.    Optivol thoracic impedance suggesting normal fluid levels.   Prescribed: Furosemide 40 mg take 1 tablet by mouth daily Potassium 20 mEq take 1 tablet by mouth daily. Spironolactone 100 mg take 1 tablet by mouth daily.   Labs:  04/27/2022 Creatinine 1.09, BUN 13, Potassium 4.9, Sodium 130, GFR 60 A complete set of results can be found in Results Review.   Recommendations:  No changes.    Follow-up plan: ICM clinic phone appointment on 06/01/2022.   91 day device clinic remote transmission 05/31/2022.     EP/Cardiology Office Visits:  05/24/2022 with Dr Graciela Husbands.   Copy of ICM check sent to Dr. Graciela Husbands.     3 month ICM trend: 04/30/2022.    12-14 Month ICM trend:     [name], RN 04/30/2022 10:19 AM

## 2022-05-03 ENCOUNTER — Other Ambulatory Visit: Payer: Self-pay | Admitting: Student

## 2022-05-03 DIAGNOSIS — E1142 Type 2 diabetes mellitus with diabetic polyneuropathy: Secondary | ICD-10-CM

## 2022-05-03 NOTE — Telephone Encounter (Signed)
Next appt scheduled 6/14 with PCP. 

## 2022-05-07 ENCOUNTER — Other Ambulatory Visit: Payer: Self-pay | Admitting: Internal Medicine

## 2022-05-07 DIAGNOSIS — Z59819 Housing instability, housed unspecified: Secondary | ICD-10-CM

## 2022-05-11 ENCOUNTER — Telehealth: Payer: Self-pay | Admitting: *Deleted

## 2022-05-11 NOTE — Chronic Care Management (AMB) (Unsigned)
  Care Management   Outreach Note  05/11/2022 Name: [name] MRN: [id number] DOB: March 01, 1966  Referred by: Merrilyn Puma, MD Reason for referral : Care Coordination (Initial outreach to schedule referral with BSW)   An unsuccessful telephone outreach was attempted today. The patient was referred to the case management team for assistance with care management and care coordination.   Follow Up Plan:  A HIPAA compliant phone message was left for the patient providing contact information and requesting a return call.  The care management team will reach out to the patient again over the next 3-5 days. If patient returns call to provider office, please advise to call Embedded Care Management Care Guide Misty Stanley at [phone].  Gwenevere Ghazi  Care Guide, Embedded Care Coordination [facility] Management  Direct Dial: [phone]

## 2022-05-13 NOTE — Chronic Care Management (AMB) (Signed)
  Care Management   Note  05/13/2022 Name: [name] MRN: [id number] DOB: 11/15/1966  [name] is a 56 y.o. year old female who is a primary care patient of Merrilyn Puma, MD. I reached out to [name] by phone today offer care coordination services.   [name] was given information about care management services today including:  Care management services include personalized support from designated clinical staff supervised by her physician, including individualized plan of care and coordination with other care providers 24/7 contact phone numbers for assistance for urgent and routine care needs. The patient may stop care management services at any time by phone call to the office staff.  Patient agreed to services and verbal consent obtained.   Follow up plan: Telephone appointment with care management team member scheduled for:05/27/22  [facility] Guide, Embedded Care Coordination [facility]  Care Management  Direct Dial: [phone]

## 2022-05-17 DIAGNOSIS — I5022 Chronic systolic (congestive) heart failure: Secondary | ICD-10-CM | POA: Insufficient documentation

## 2022-05-17 HISTORY — DX: Chronic systolic (congestive) heart failure: I50.22

## 2022-05-24 ENCOUNTER — Encounter: Payer: Medicaid Other | Admitting: Internal Medicine

## 2022-05-24 DIAGNOSIS — I5022 Chronic systolic (congestive) heart failure: Secondary | ICD-10-CM

## 2022-05-24 DIAGNOSIS — I447 Left bundle-branch block, unspecified: Secondary | ICD-10-CM

## 2022-05-24 DIAGNOSIS — I428 Other cardiomyopathies: Secondary | ICD-10-CM

## 2022-05-24 DIAGNOSIS — I4891 Unspecified atrial fibrillation: Secondary | ICD-10-CM

## 2022-05-24 DIAGNOSIS — Z95810 Presence of automatic (implantable) cardiac defibrillator: Secondary | ICD-10-CM

## 2022-05-25 ENCOUNTER — Telehealth: Payer: Self-pay | Admitting: Behavioral Health

## 2022-05-25 ENCOUNTER — Institutional Professional Consult (permissible substitution): Payer: 59 | Admitting: Behavioral Health

## 2022-05-25 NOTE — Telephone Encounter (Signed)
Lft multiple msgs for Pt re: today's [facility] telehealth session. Encouraged Pt re: her housing insecurity & to call [facility] & schedule as she feels necessary.  Dr. Monna Fam

## 2022-05-26 ENCOUNTER — Encounter: Payer: Self-pay | Admitting: Student

## 2022-05-26 ENCOUNTER — Encounter: Payer: 59 | Admitting: Student

## 2022-05-27 ENCOUNTER — Telehealth: Payer: 59 | Admitting: Licensed Clinical Social Worker

## 2022-05-31 ENCOUNTER — Ambulatory Visit (INDEPENDENT_AMBULATORY_CARE_PROVIDER_SITE_OTHER): Payer: PRIVATE HEALTH INSURANCE

## 2022-05-31 DIAGNOSIS — I428 Other cardiomyopathies: Secondary | ICD-10-CM

## 2022-06-01 ENCOUNTER — Ambulatory Visit (INDEPENDENT_AMBULATORY_CARE_PROVIDER_SITE_OTHER): Payer: 59

## 2022-06-01 DIAGNOSIS — I5042 Chronic combined systolic (congestive) and diastolic (congestive) heart failure: Secondary | ICD-10-CM | POA: Diagnosis not present

## 2022-06-01 DIAGNOSIS — Z95810 Presence of automatic (implantable) cardiac defibrillator: Secondary | ICD-10-CM

## 2022-06-01 NOTE — Progress Notes (Signed)
EPIC Encounter for ICM Monitoring  Patient Name: [name] is a 56 y.o. female Date: 06/01/2022 Primary Care Physican: Merrilyn Puma, MD Primary Cardiologist: Shirlee Latch Electrophysiologist: Joycelyn Schmid Pacing: 98.6%         06/01/2022 Weight: 237 lbs          Spoke with patient and heart failure questions reviewed.  Pt asymptomatic for fluid accumulation.  She takes Furosemide a couple of times a week instead of daily.   Optivol thoracic impedance suggesting possible fluid accumulation starting 6/9.   Prescribed: Furosemide 40 mg take 1 tablet by mouth daily Potassium 20 mEq take 1 tablet by mouth daily. Spironolactone 100 mg take 1 tablet by mouth daily.   Labs:  04/27/2022 Creatinine 1.09, BUN 13, Potassium 4.9, Sodium 130, GFR 60 A complete set of results can be found in Results Review.   Recommendations:  Advised to take Furosemide as prescribed daily.  She will try to take one for the next 2-3 days.    Follow-up plan: ICM clinic phone appointment on 06/07/2022 to recheck fluid levels.   91 day device clinic remote transmission 08/30/2022.     EP/Cardiology Office Visits:  Missed 05/24/2022 appt with Dr Graciela Husbands and canceled May appointment.  Last appointment with Dr Shirlee Latch 07/17/2021.   Copy of ICM check sent to Dr. Graciela Husbands.      3 month ICM trend: 06/01/2022.    12-14 Month ICM trend:     [name], RN 06/01/2022 2:41 PM

## 2022-06-02 ENCOUNTER — Telehealth: Payer: Self-pay

## 2022-06-02 LAB — CUP PACEART REMOTE DEVICE CHECK
Battery Remaining Longevity: 43 mo
Battery Voltage: 2.96 V
Brady Statistic AP VP Percent: 0 %
Brady Statistic AP VS Percent: 0.01 %
Brady Statistic AS VP Percent: 98.14 %
Brady Statistic AS VS Percent: 1.85 %
Brady Statistic RA Percent Paced: 0.01 %
Brady Statistic RV Percent Paced: 58.82 %
Date Time Interrogation Session: 20230618180604
HighPow Impedance: 53 Ohm
Implantable Lead Implant Date: 20140910
Implantable Lead Implant Date: 20140910
Implantable Lead Implant Date: 20140910
Implantable Lead Location: 753858
Implantable Lead Location: 753859
Implantable Lead Location: 753860
Implantable Lead Model: 4298
Implantable Lead Model: 5076
Implantable Lead Model: 6935
Implantable Pulse Generator Implant Date: 20220406
Lead Channel Impedance Value: 171 Ohm
Lead Channel Impedance Value: 184.154
Lead Channel Impedance Value: 184.154
Lead Channel Impedance Value: 184.154
Lead Channel Impedance Value: 199.5 Ohm
Lead Channel Impedance Value: 342 Ohm
Lead Channel Impedance Value: 342 Ohm
Lead Channel Impedance Value: 380 Ohm
Lead Channel Impedance Value: 399 Ohm
Lead Channel Impedance Value: 399 Ohm
Lead Channel Impedance Value: 399 Ohm
Lead Channel Impedance Value: 437 Ohm
Lead Channel Impedance Value: 456 Ohm
Lead Channel Impedance Value: 589 Ohm
Lead Channel Impedance Value: 627 Ohm
Lead Channel Impedance Value: 646 Ohm
Lead Channel Impedance Value: 646 Ohm
Lead Channel Impedance Value: 665 Ohm
Lead Channel Pacing Threshold Amplitude: 0.5 V
Lead Channel Pacing Threshold Amplitude: 0.625 V
Lead Channel Pacing Threshold Amplitude: 1 V
Lead Channel Pacing Threshold Pulse Width: 0.4 ms
Lead Channel Pacing Threshold Pulse Width: 0.4 ms
Lead Channel Pacing Threshold Pulse Width: 0.4 ms
Lead Channel Sensing Intrinsic Amplitude: 17.625 mV
Lead Channel Sensing Intrinsic Amplitude: 17.625 mV
Lead Channel Sensing Intrinsic Amplitude: 3.875 mV
Lead Channel Sensing Intrinsic Amplitude: 3.875 mV
Lead Channel Setting Pacing Amplitude: 1.5 V
Lead Channel Setting Pacing Amplitude: 2 V
Lead Channel Setting Pacing Amplitude: 4 V
Lead Channel Setting Pacing Pulse Width: 0.4 ms
Lead Channel Setting Pacing Pulse Width: 0.4 ms
Lead Channel Setting Sensing Sensitivity: 0.3 mV

## 2022-06-02 NOTE — Telephone Encounter (Signed)
Scheduled remote reviewed. Normal device function.   No LV threshold completion since May 2022, Competing rhythm, fast intrinsic rate, or short AV Re-route to triage,  (routed 3/19, pt was to f/u with SK + device check) Next remote 91 days. [place]  Patient was schedule for follow up with Dr. Graciela Husbands 05/24/22 and was a no show. Successful telephone encounter to patient to follow up on "no show" appointment. Patient states "I am just off right now living in a motel". Advised patient the importance of follow up and in clinic device testing. Patient states she would like to reschedule. Transferred call to scheduling. Appointment made for 06/25/22.

## 2022-06-06 ENCOUNTER — Other Ambulatory Visit: Payer: Self-pay | Admitting: Student

## 2022-06-07 ENCOUNTER — Ambulatory Visit (INDEPENDENT_AMBULATORY_CARE_PROVIDER_SITE_OTHER): Payer: 59

## 2022-06-07 DIAGNOSIS — I5042 Chronic combined systolic (congestive) and diastolic (congestive) heart failure: Secondary | ICD-10-CM

## 2022-06-07 DIAGNOSIS — Z95810 Presence of automatic (implantable) cardiac defibrillator: Secondary | ICD-10-CM

## 2022-06-10 ENCOUNTER — Telehealth: Payer: Self-pay | Admitting: Licensed Clinical Social Worker

## 2022-06-10 ENCOUNTER — Ambulatory Visit: Payer: 59 | Admitting: Licensed Clinical Social Worker

## 2022-06-10 NOTE — Telephone Encounter (Signed)
  Care Management   Follow Up Note   05/27/2022 Name: [name] MRN: [id number] DOB: 07-03-1966   Referred by: Merrilyn Puma, MD Reason for referral : No chief complaint on file.  Late Entry- Entry regarding appointment on 05/27/22  An unsuccessful telephone outreach was attempted today. The patient was referred to the case management team for assistance with care management and care coordination.   Follow Up Plan: The care management team will reach out to the patient again over the next 30 days.   Ander Gaster, MSW  Social Worker [facility] Care Management  [phone]

## 2022-06-10 NOTE — Chronic Care Management (AMB) (Signed)
  Care Management   Social Work Visit Note  06/10/2022 Name: [name] MRN: [id number] DOB: [birth year]  [name] is a 56 y.o. year old female who sees Merrilyn Puma, MD for primary care. The care management team was consulted for assistance with care management and care coordination needs related to [facility] Resources    Patient was given the following information about care management and care coordination services today, agreed to services, and gave verbal consent: 1.care management/care coordination services include personalized support from designated clinical staff supervised by their physician, including individualized plan of care and coordination with other care providers 2. 24/7 contact phone numbers for assistance for urgent and routine care needs. 3. The patient may stop care management/care coordination services at any time by phone call to the office staff.  Engaged with patient by telephone for follow up visit in response to provider referral for social work chronic care management and care coordination services.  Assessment: Review of patient history, allergies, and health status during evaluation of patient need for care management/care coordination services.    Interventions:  Patient interviewed and appropriate assessments performed Collaborated with clinical team regarding patient needs  Successful outreach to patient on today.  Patient currently lives in a hotel and wanting an apartment or house.  Patient has already been approved for housing assistance and received a voucher. Patient is searching for a landlord who will accept. Patient has a listing proving by; housing authority and partnership ending homelessness.  SW has no additional resources or knowledge with landlords accepted housing vouchers.  SW advised patient to continue the search and communicating with Housing.  Patient stated she isn't at risk of losing her hotel and she is staying until she  finds another placement.  SW advised patient to reach out to SW in future if further assistance is needed.  Patient has all resources available to her at this time.      Plan:  No further follow up needed at this time.  Ander Gaster, MSW  Social Worker [facility] Care Management  [phone]

## 2022-06-10 NOTE — Patient Instructions (Signed)
Visit Information  Instructions:   Patient was given the following information about care management and care coordination services today, agreed to services, and gave verbal consent: 1.care management/care coordination services include personalized support from designated clinical staff supervised by their physician, including individualized plan of care and coordination with other care providers 2. 24/7 contact phone numbers for assistance for urgent and routine care needs. 3. The patient may stop care management/care coordination services at any time by phone call to the office staff.  Patient verbalizes understanding of instructions and care plan provided today and agrees to view in MyChart. Active MyChart status and patient understanding of how to access instructions and care plan via MyChart confirmed with patient.     No further follow up required: .  [name], BSW , MSW Social Worker [facility] Care Management  [phone]      

## 2022-06-12 ENCOUNTER — Other Ambulatory Visit: Payer: Self-pay | Admitting: Student

## 2022-06-20 ENCOUNTER — Other Ambulatory Visit (HOSPITAL_COMMUNITY): Payer: Self-pay | Admitting: Cardiology

## 2022-06-20 DIAGNOSIS — I10 Essential (primary) hypertension: Secondary | ICD-10-CM

## 2022-06-21 ENCOUNTER — Other Ambulatory Visit (HOSPITAL_COMMUNITY): Payer: Self-pay | Admitting: Cardiology

## 2022-06-21 ENCOUNTER — Ambulatory Visit (INDEPENDENT_AMBULATORY_CARE_PROVIDER_SITE_OTHER): Payer: 59

## 2022-06-21 DIAGNOSIS — Z95810 Presence of automatic (implantable) cardiac defibrillator: Secondary | ICD-10-CM

## 2022-06-21 DIAGNOSIS — I5042 Chronic combined systolic (congestive) and diastolic (congestive) heart failure: Secondary | ICD-10-CM

## 2022-06-22 ENCOUNTER — Encounter: Payer: Self-pay | Admitting: Internal Medicine

## 2022-06-22 NOTE — Progress Notes (Signed)
Remote ICD transmission.   

## 2022-06-23 NOTE — Progress Notes (Signed)
EPIC Encounter for ICM Monitoring  Patient Name: [name] is a 56 y.o. female Date: 06/23/2022 Primary Care Physican: Merrilyn Puma, MD Primary Cardiologist: Shirlee Latch Electrophysiologist: Joycelyn Schmid Pacing: 97.5%         06/01/2022 Weight: 237 lbs  06/07/2022 Weight: 237 lbs         Attempted call to patient and unable to reach.   Transmission reviewed.    Optivol thoracic impedance suggesting possible fluid accumulation starting 6/9.   Prescribed: Furosemide 40 mg take 1 tablet by mouth daily Potassium 20 mEq take 1 tablet by mouth daily. Spironolactone 100 mg take 1 tablet by mouth daily.   Labs:  04/27/2022 Creatinine 1.09, BUN 13, Potassium 4.9, Sodium 130, GFR 60 A complete set of results can be found in Results Review.   Recommendations:  Unable to reach.     Follow-up plan: ICM clinic phone appointment on 07/19/2022.   91 day device clinic remote transmission 08/30/2022.     EP/Cardiology Office Visits:  Canceled 06/25/2022 appt with Dr Graciela Husbands.  07/12/2022 with HF clinic.   Copy of ICM check sent to Dr. Graciela Husbands.     3 month ICM trend: 06/21/2022.    12-14 Month ICM trend:     [name], RN 06/23/2022 4:52 PM

## 2022-06-25 ENCOUNTER — Encounter: Payer: Medicaid Other | Admitting: Internal Medicine

## 2022-07-09 ENCOUNTER — Telehealth (HOSPITAL_COMMUNITY): Payer: Self-pay

## 2022-07-09 NOTE — Telephone Encounter (Signed)
Called and left patient a detailed message  to confirm/remind patient of their appointment at [facility] on 07/12/22.

## 2022-07-12 ENCOUNTER — Encounter (HOSPITAL_COMMUNITY): Payer: 59

## 2022-07-19 ENCOUNTER — Ambulatory Visit (INDEPENDENT_AMBULATORY_CARE_PROVIDER_SITE_OTHER): Payer: Medicaid Other

## 2022-07-19 DIAGNOSIS — I5042 Chronic combined systolic (congestive) and diastolic (congestive) heart failure: Secondary | ICD-10-CM

## 2022-07-19 DIAGNOSIS — Z95810 Presence of automatic (implantable) cardiac defibrillator: Secondary | ICD-10-CM

## 2022-07-21 NOTE — Progress Notes (Signed)
EPIC Encounter for ICM Monitoring  Patient Name: [name] is a 56 y.o. female Date: 07/21/2022 Primary Care Physican: Merrilyn Puma, MD Primary Cardiologist: Shirlee Latch Electrophysiologist: Joycelyn Schmid Pacing: 97.9%         06/01/2022 Weight: 237 lbs  06/07/2022 Weight: 237 lbs  Time in AT/AF <0.1 hr/day (<0.1%)         Transmission reviewed.    Optivol thoracic impedance trending close to baseline normal.   Prescribed: Furosemide 40 mg take 1 tablet by mouth daily Potassium 20 mEq take 1 tablet by mouth daily. Spironolactone 100 mg take 1 tablet by mouth daily.   Labs:  04/27/2022 Creatinine 1.09, BUN 13, Potassium 4.9, Sodium 130, GFR 60 A complete set of results can be found in Results Review.   Recommendations:  No changes   Follow-up plan: ICM clinic phone appointment on 08/23/2022.   91 day device clinic remote transmission 08/30/2022.     EP/Cardiology Office Visits:  Canceled 06/25/2022 appt with Dr Graciela Husbands.  Missed 07/12/2022 with HF clinic.   Copy of ICM check sent to Dr. Graciela Husbands.      3 month ICM trend: 07/19/2022.    12-14 Month ICM trend:     [name], RN 07/21/2022 11:06 AM

## 2022-08-03 ENCOUNTER — Encounter: Payer: Self-pay | Admitting: Internal Medicine

## 2022-08-11 ENCOUNTER — Telehealth: Payer: Self-pay | Admitting: *Deleted

## 2022-08-11 NOTE — Telephone Encounter (Signed)
Patient called in requesting appt for yeast infection. She was given first available appt for 9/11 and transferred to Triage. Patient c/o vaginal yeast infection, and yeast under breasts and left arm affected by CVA. Also, c/o increased thirst. States her glucometer has not worked > 1 month. She is advised to head directly to UC as she has no idea what her CBG is and keep appt on 9/11. States she will head to UC now.

## 2022-08-23 ENCOUNTER — Encounter: Payer: No Typology Code available for payment source | Admitting: Student

## 2022-08-23 ENCOUNTER — Encounter: Payer: Self-pay | Admitting: Student

## 2022-08-25 NOTE — Progress Notes (Signed)
No ICM remote transmission received for 08/23/2022 and next ICM transmission scheduled for 09/13/2022.   

## 2022-09-01 ENCOUNTER — Other Ambulatory Visit: Payer: Self-pay | Admitting: Student

## 2022-09-01 DIAGNOSIS — E1142 Type 2 diabetes mellitus with diabetic polyneuropathy: Secondary | ICD-10-CM

## 2022-09-12 ENCOUNTER — Inpatient Hospital Stay (HOSPITAL_COMMUNITY)
Admission: EM | Admit: 2022-09-12 | Discharge: 2022-09-15 | DRG: 690 | Disposition: A | Discharge status: Home Health | Payer: Medicaid Other | Attending: Internal Medicine | Admitting: Internal Medicine

## 2022-09-12 ENCOUNTER — Other Ambulatory Visit: Payer: Self-pay

## 2022-09-12 ENCOUNTER — Emergency Department (HOSPITAL_COMMUNITY): Payer: Medicaid Other

## 2022-09-12 ENCOUNTER — Encounter (HOSPITAL_COMMUNITY): Payer: Self-pay

## 2022-09-12 DIAGNOSIS — Z59819 Housing instability, housed unspecified: Secondary | ICD-10-CM

## 2022-09-12 DIAGNOSIS — N179 Acute kidney failure, unspecified: Secondary | ICD-10-CM | POA: Diagnosis present

## 2022-09-12 DIAGNOSIS — Z841 Family history of disorders of kidney and ureter: Secondary | ICD-10-CM

## 2022-09-12 DIAGNOSIS — Z95810 Presence of automatic (implantable) cardiac defibrillator: Secondary | ICD-10-CM

## 2022-09-12 DIAGNOSIS — Z7901 Long term (current) use of anticoagulants: Secondary | ICD-10-CM

## 2022-09-12 DIAGNOSIS — F32A Depression, unspecified: Secondary | ICD-10-CM | POA: Diagnosis present

## 2022-09-12 DIAGNOSIS — E860 Dehydration: Secondary | ICD-10-CM | POA: Diagnosis present

## 2022-09-12 DIAGNOSIS — Z823 Family history of stroke: Secondary | ICD-10-CM

## 2022-09-12 DIAGNOSIS — E785 Hyperlipidemia, unspecified: Secondary | ICD-10-CM | POA: Diagnosis present

## 2022-09-12 DIAGNOSIS — B372 Candidiasis of skin and nail: Secondary | ICD-10-CM | POA: Diagnosis present

## 2022-09-12 DIAGNOSIS — Z79899 Other long term (current) drug therapy: Secondary | ICD-10-CM

## 2022-09-12 DIAGNOSIS — I110 Hypertensive heart disease with heart failure: Secondary | ICD-10-CM | POA: Diagnosis present

## 2022-09-12 DIAGNOSIS — G4733 Obstructive sleep apnea (adult) (pediatric): Secondary | ICD-10-CM | POA: Diagnosis present

## 2022-09-12 DIAGNOSIS — Z8249 Family history of ischemic heart disease and other diseases of the circulatory system: Secondary | ICD-10-CM

## 2022-09-12 DIAGNOSIS — N3080 Other cystitis without hematuria: Secondary | ICD-10-CM

## 2022-09-12 DIAGNOSIS — I5042 Chronic combined systolic (congestive) and diastolic (congestive) heart failure: Secondary | ICD-10-CM | POA: Diagnosis present

## 2022-09-12 DIAGNOSIS — E1165 Type 2 diabetes mellitus with hyperglycemia: Secondary | ICD-10-CM | POA: Diagnosis present

## 2022-09-12 DIAGNOSIS — I4891 Unspecified atrial fibrillation: Secondary | ICD-10-CM | POA: Diagnosis present

## 2022-09-12 DIAGNOSIS — N3946 Mixed incontinence: Secondary | ICD-10-CM | POA: Diagnosis present

## 2022-09-12 DIAGNOSIS — G8929 Other chronic pain: Secondary | ICD-10-CM | POA: Diagnosis present

## 2022-09-12 DIAGNOSIS — Z833 Family history of diabetes mellitus: Secondary | ICD-10-CM

## 2022-09-12 DIAGNOSIS — B356 Tinea cruris: Secondary | ICD-10-CM | POA: Diagnosis present

## 2022-09-12 DIAGNOSIS — E1142 Type 2 diabetes mellitus with diabetic polyneuropathy: Secondary | ICD-10-CM | POA: Diagnosis present

## 2022-09-12 DIAGNOSIS — I69354 Hemiplegia and hemiparesis following cerebral infarction affecting left non-dominant side: Secondary | ICD-10-CM

## 2022-09-12 DIAGNOSIS — Z560 Unemployment, unspecified: Secondary | ICD-10-CM

## 2022-09-12 DIAGNOSIS — Z888 Allergy status to other drugs, medicaments and biological substances status: Secondary | ICD-10-CM

## 2022-09-12 DIAGNOSIS — K219 Gastro-esophageal reflux disease without esophagitis: Secondary | ICD-10-CM | POA: Diagnosis present

## 2022-09-12 DIAGNOSIS — E1140 Type 2 diabetes mellitus with diabetic neuropathy, unspecified: Secondary | ICD-10-CM | POA: Diagnosis present

## 2022-09-12 DIAGNOSIS — N3081 Other cystitis with hematuria: Principal | ICD-10-CM | POA: Diagnosis present

## 2022-09-12 DIAGNOSIS — N1330 Unspecified hydronephrosis: Secondary | ICD-10-CM | POA: Diagnosis present

## 2022-09-12 DIAGNOSIS — Z794 Long term (current) use of insulin: Secondary | ICD-10-CM

## 2022-09-12 DIAGNOSIS — Z83511 Family history of glaucoma: Secondary | ICD-10-CM

## 2022-09-12 DIAGNOSIS — I25119 Atherosclerotic heart disease of native coronary artery with unspecified angina pectoris: Secondary | ICD-10-CM | POA: Diagnosis present

## 2022-09-12 DIAGNOSIS — Z6821 Body mass index (BMI) 21.0-21.9, adult: Secondary | ICD-10-CM

## 2022-09-12 LAB — CBG MONITORING, ED
Glucose-Capillary: 370 mg/dL — ABNORMAL HIGH (ref 70–99)
Glucose-Capillary: 289 mg/dL — ABNORMAL HIGH (ref 70–99)
Glucose-Capillary: 304 mg/dL — ABNORMAL HIGH (ref 70–99)
Glucose-Capillary: 199 mg/dL — ABNORMAL HIGH (ref 70–99)

## 2022-09-12 LAB — COMPREHENSIVE METABOLIC PANEL
ALT: 12 U/L (ref 0–44)
AST: 15 U/L (ref 15–41)
Albumin: 2.5 g/dL — ABNORMAL LOW (ref 3.5–5.0)
Alkaline Phosphatase: 126 U/L (ref 38–126)
Anion gap: 11 (ref 5–15)
BUN: 15 mg/dL (ref 6–20)
CO2: 23 mmol/L (ref 22–32)
Calcium: 8.6 mg/dL — ABNORMAL LOW (ref 8.9–10.3)
Chloride: 98 mmol/L (ref 98–111)
Creatinine, Ser: 1.4 mg/dL — ABNORMAL HIGH (ref 0.44–1.00)
GFR, Estimated: 44 mL/min — ABNORMAL LOW (ref >60)
Glucose, Bld: 467 mg/dL — ABNORMAL HIGH (ref 70–99)
Potassium: 3.6 mmol/L (ref 3.5–5.1)
Sodium: 132 mmol/L — ABNORMAL LOW (ref 135–145)
Total Bilirubin: 0.5 mg/dL (ref 0.3–1.2)
Total Protein: 7.9 g/dL (ref 6.5–8.1)

## 2022-09-12 LAB — CBC WITH DIFFERENTIAL/PLATELET
Abs Immature Granulocytes: 0.05 10*3/uL (ref 0.00–0.07)
Basophils Absolute: 0 10*3/uL (ref 0.0–0.1)
Basophils Relative: 0 %
Eosinophils Absolute: 0.1 10*3/uL (ref 0.0–0.5)
Eosinophils Relative: 1 %
HCT: 34.4 % — ABNORMAL LOW (ref 36.0–46.0)
Hemoglobin: 11.2 g/dL — ABNORMAL LOW (ref 12.0–15.0)
Immature Granulocytes: 1 %
Lymphocytes Relative: 21 %
Lymphs Abs: 2.2 10*3/uL (ref 0.7–4.0)
MCH: 28.4 pg (ref 26.0–34.0)
MCHC: 32.6 g/dL (ref 30.0–36.0)
MCV: 87.1 fL (ref 80.0–100.0)
Monocytes Absolute: 0.7 10*3/uL (ref 0.1–1.0)
Monocytes Relative: 7 %
Neutro Abs: 7.7 10*3/uL (ref 1.7–7.7)
Neutrophils Relative %: 70 %
Platelets: 226 10*3/uL (ref 150–400)
RBC: 3.95 MIL/uL (ref 3.87–5.11)
RDW: 13.3 % (ref 11.5–15.5)
WBC: 10.8 10*3/uL — ABNORMAL HIGH (ref 4.0–10.5)
nRBC: 0 % (ref 0.0–0.2)

## 2022-09-12 LAB — URINALYSIS, ROUTINE W REFLEX MICROSCOPIC
Bilirubin Urine: NEGATIVE
Glucose, UA: >=500 mg/dL — AB
Ketones, ur: NEGATIVE mg/dL
Nitrite: NEGATIVE
Protein, ur: 30 mg/dL — AB
Specific Gravity, Urine: 1.012 (ref 1.005–1.030)
WBC, UA: >50 WBC/hpf — ABNORMAL HIGH (ref 0–5)
pH: 5 (ref 5.0–8.0)

## 2022-09-12 LAB — BRAIN NATRIURETIC PEPTIDE: B Natriuretic Peptide: 85.3 pg/mL (ref 0.0–100.0)

## 2022-09-12 LAB — TROPONIN I (HIGH SENSITIVITY)
Troponin I (High Sensitivity): 9 ng/L (ref <18)
Troponin I (High Sensitivity): 10 ng/L (ref <18)

## 2022-09-12 MED ORDER — HYDRALAZINE HCL 25 MG PO TABS
25.0000 mg | ORAL_TABLET | Freq: Three times a day (TID) | ORAL | Status: DC
  Administered 2022-09-12 – 2022-09-15 (×10): 25 mg via ORAL
  Filled 2022-09-12 (×10): qty 1

## 2022-09-12 MED ORDER — ESCITALOPRAM OXALATE 10 MG PO TABS
10.0000 mg | ORAL_TABLET | Freq: Every day | ORAL | Status: DC
  Administered 2022-09-12 – 2022-09-15 (×4): 10 mg via ORAL
  Filled 2022-09-12 (×4): qty 1

## 2022-09-12 MED ORDER — ONDANSETRON HCL 4 MG/2ML IJ SOLN
4.0000 mg | Freq: Once | INTRAMUSCULAR | Status: AC
  Administered 2022-09-12: 4 mg via INTRAVENOUS
  Filled 2022-09-12: qty 2

## 2022-09-12 MED ORDER — ATORVASTATIN CALCIUM 40 MG PO TABS
40.0000 mg | ORAL_TABLET | Freq: Every day | ORAL | Status: DC
  Administered 2022-09-12 – 2022-09-15 (×4): 40 mg via ORAL
  Filled 2022-09-12 (×4): qty 1

## 2022-09-12 MED ORDER — VITAMIN B-12 1000 MCG PO TABS
1000.0000 ug | ORAL_TABLET | Freq: Every day | ORAL | Status: DC
  Administered 2022-09-12 – 2022-09-15 (×4): 1000 ug via ORAL
  Filled 2022-09-12 (×4): qty 1

## 2022-09-12 MED ORDER — LEVETIRACETAM 500 MG PO TABS
500.0000 mg | ORAL_TABLET | Freq: Two times a day (BID) | ORAL | Status: DC
  Administered 2022-09-12 – 2022-09-15 (×7): 500 mg via ORAL
  Filled 2022-09-12 (×7): qty 1

## 2022-09-12 MED ORDER — IOHEXOL 350 MG/ML SOLN
100.0000 mL | Freq: Once | INTRAVENOUS | Status: AC | PRN
  Administered 2022-09-12: 100 mL via INTRAVENOUS

## 2022-09-12 MED ORDER — CYCLOBENZAPRINE HCL 5 MG PO TABS
5.0000 mg | ORAL_TABLET | Freq: Three times a day (TID) | ORAL | Status: DC | PRN
  Administered 2022-09-12 – 2022-09-14 (×4): 5 mg via ORAL
  Filled 2022-09-12 (×4): qty 1

## 2022-09-12 MED ORDER — PANTOPRAZOLE SODIUM 40 MG PO TBEC
40.0000 mg | DELAYED_RELEASE_TABLET | Freq: Every day | ORAL | Status: DC
  Administered 2022-09-12 – 2022-09-15 (×4): 40 mg via ORAL
  Filled 2022-09-12 (×4): qty 1

## 2022-09-12 MED ORDER — PIPERACILLIN-TAZOBACTAM 3.375 G IVPB
3.3750 g | Freq: Three times a day (TID) | INTRAVENOUS | Status: DC
  Administered 2022-09-12 – 2022-09-15 (×9): 3.375 g via INTRAVENOUS
  Filled 2022-09-12 (×10): qty 50

## 2022-09-12 MED ORDER — FOLIC ACID 1 MG PO TABS
1.0000 mg | ORAL_TABLET | Freq: Every day | ORAL | Status: DC
  Administered 2022-09-12 – 2022-09-15 (×4): 1 mg via ORAL
  Filled 2022-09-12 (×4): qty 1

## 2022-09-12 MED ORDER — GABAPENTIN 300 MG PO CAPS
300.0000 mg | ORAL_CAPSULE | Freq: Two times a day (BID) | ORAL | Status: DC
  Administered 2022-09-12 – 2022-09-15 (×7): 300 mg via ORAL
  Filled 2022-09-12 (×7): qty 1

## 2022-09-12 MED ORDER — LINACLOTIDE 145 MCG PO CAPS: 290.0000 ug | ORAL_CAPSULE | Freq: Every day | ORAL | Status: DC | PRN

## 2022-09-12 MED ORDER — ACETAMINOPHEN 325 MG PO TABS
650.0000 mg | ORAL_TABLET | Freq: Four times a day (QID) | ORAL | Status: DC | PRN
  Administered 2022-09-13 – 2022-09-14 (×2): 650 mg via ORAL
  Filled 2022-09-12 (×3): qty 2

## 2022-09-12 MED ORDER — RIVAROXABAN 20 MG PO TABS
20.0000 mg | ORAL_TABLET | Freq: Every day | ORAL | Status: DC
  Filled 2022-09-12: qty 2

## 2022-09-12 MED ORDER — METOPROLOL SUCCINATE ER 50 MG PO TB24
100.0000 mg | ORAL_TABLET | Freq: Every day | ORAL | Status: DC
  Administered 2022-09-12 – 2022-09-15 (×4): 100 mg via ORAL
  Filled 2022-09-12 (×3): qty 2
  Filled 2022-09-12: qty 4

## 2022-09-12 MED ORDER — FENTANYL CITRATE PF 50 MCG/ML IJ SOSY
50.0000 ug | PREFILLED_SYRINGE | Freq: Once | INTRAMUSCULAR | Status: AC
  Administered 2022-09-12: 50 ug via INTRAVENOUS
  Filled 2022-09-12: qty 1

## 2022-09-12 MED ORDER — ASPIRIN 81 MG PO CHEW
162.0000 mg | CHEWABLE_TABLET | Freq: Once | ORAL | Status: AC
  Administered 2022-09-12: 162 mg via ORAL
  Filled 2022-09-12: qty 2

## 2022-09-12 MED ORDER — SODIUM CHLORIDE 0.9 % IV SOLN
1.0000 g | Freq: Once | INTRAVENOUS | Status: AC
  Administered 2022-09-12: 1 g via INTRAVENOUS
  Filled 2022-09-12: qty 10

## 2022-09-12 MED ORDER — ACETAMINOPHEN 650 MG RE SUPP: 650.0000 mg | Freq: Four times a day (QID) | RECTAL | Status: DC | PRN

## 2022-09-12 MED ORDER — FLUCONAZOLE 150 MG PO TABS
150.0000 mg | ORAL_TABLET | ORAL | Status: DC
  Administered 2022-09-12: 150 mg via ORAL
  Filled 2022-09-12: qty 1

## 2022-09-12 MED ORDER — RIVAROXABAN 15 MG PO TABS
15.0000 mg | ORAL_TABLET | Freq: Every day | ORAL | Status: DC
  Administered 2022-09-12: 15 mg via ORAL
  Filled 2022-09-12: qty 1

## 2022-09-12 MED ORDER — INSULIN GLARGINE-YFGN 100 UNIT/ML ~~LOC~~ SOLN
30.0000 [IU] | Freq: Every day | SUBCUTANEOUS | Status: DC
  Administered 2022-09-12: 30 [IU] via SUBCUTANEOUS
  Filled 2022-09-12 (×2): qty 0.3

## 2022-09-12 MED ORDER — ZINC OXIDE 40 % EX OINT
TOPICAL_OINTMENT | CUTANEOUS | Status: DC
  Filled 2022-09-12: qty 57

## 2022-09-12 MED ORDER — INSULIN ASPART 100 UNIT/ML IJ SOLN
0.0000 [IU] | Freq: Three times a day (TID) | INTRAMUSCULAR | Status: DC
  Administered 2022-09-12: 11 [IU] via SUBCUTANEOUS
  Administered 2022-09-13: 5 [IU] via SUBCUTANEOUS
  Administered 2022-09-13 (×2): 3 [IU] via SUBCUTANEOUS
  Administered 2022-09-14: 2 [IU] via SUBCUTANEOUS
  Administered 2022-09-14: 3 [IU] via SUBCUTANEOUS

## 2022-09-12 MED ORDER — SENNOSIDES-DOCUSATE SODIUM 8.6-50 MG PO TABS: 1.0000 | ORAL_TABLET | Freq: Every evening | ORAL | Status: DC | PRN

## 2022-09-12 MED ORDER — ISOSORBIDE MONONITRATE ER 60 MG PO TB24
60.0000 mg | ORAL_TABLET | Freq: Every day | ORAL | Status: DC
  Administered 2022-09-12 – 2022-09-15 (×4): 60 mg via ORAL
  Filled 2022-09-12: qty 2
  Filled 2022-09-12 (×3): qty 1

## 2022-09-12 NOTE — ED Notes (Signed)
Patient moved over onto hospital bed for comfort.  Patient turned into right offload position.

## 2022-09-12 NOTE — Consult Note (Addendum)
[place] Nurse Consult Note: Reason for Consult:Stage 3 pressure injury. No pressure injury found. Patient with Moisture associated skin damage, irritant contact dermatitis due to contact with urinary incontinence. Partial thickness skin loss  ICD-10 CM Codes for Irritant Dermatitis:  L24A2 - Due to fecal, urinary or dual incontinence L30.4  - Erythema intertrigo. Also used for abrasion of the hand, chafing of the skin, dermatitis due to sweating and friction, friction dermatitis, friction eczema, and genital/thigh intertrigo.  L24A9 - Due to friction or contact with other specified body fluids   Wound type:partial thickness skin loss due to Moisture Pressure Injury POA: Yes/No/NA Measurement: 0.2cm round x 0.1cm near area of 6cm x 3cm with a different skin tone, but intact Wound IDP:OEUM, moist Drainage (amount, consistency, odor) scant serous Periwound: with lighter area of pigmentation in the gluteal cleft (light pink, dry), macerated Dressing procedure/placement/frequency: I will provide guidance for nursing to cleanse and apply Desitin ointment every 4 hours and PRN soiling. Patient to lie in the side lying position and minimize time in the supine position. No disposable briefs.  Heels are to be floated and a sacral foam placed for PI prevention.  I communicated with Physician team via [place].  [place] nursing team will not follow, but will remain available to this patient, the nursing and medical teams.  Please re-consult if needed.  Thank you for inviting [place] to participate in this patient's Plan of Care.  Maudie Flakes, MSN, RN, CNS, [place], Serita Grammes, [employer], [employer] phone:  [phone]

## 2022-09-12 NOTE — ED Triage Notes (Signed)
Pt brought by EMS for CP that began after having verbal argument with dtr. Pt describes pain as sharp, midsternal and is "5/10".

## 2022-09-12 NOTE — ED Provider Notes (Addendum)
[facility] EMERGENCY DEPARTMENT Provider Note   CSN: [id number] Arrival date & time: 09/12/22  0342     History  Chief Complaint  Patient presents with   Chest Pain    [name] is a 56 y.o. female.  The history is provided by the patient, the EMS personnel and medical records.  Chest Pain [name] is a 56 y.o. female who presents to the Emergency Department complaining of chest pain.  She presents to the emergency department for evaluation of sharp left-sided chest pain that started 1 hour prior to ED arrival.  She states that the pain started in the midst of a verbal altercation with her daughter.  No fevers, shortness of breath, nausea, vomiting.  She has a history of prior CVA with left hemiparesis, hypertension, diabetes, CHF.  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  She is compliant with her medications.  She is on Xarelto.   She received $RemoveBefo'162mg'GjIgcRdxrlK$  ASA and 1ntg by EMS (EMS did not have full 364 on the unit or she would have received full dose).      Home Medications Prior to Admission medications   Medication Sig Start Date End Date Taking? Authorizing Provider  Accu-Chek FastClix Lancets MISC Check blood sugar 2 times a day 02/16/22   Lajean Manes, MD  amLODipine (NORVASC) 10 MG tablet TAKE ONE TABLET BY MOUTH EVERY DAY 06/22/22   Larey Dresser, MD  atorvastatin (LIPITOR) 40 MG tablet Take 1 tablet (40 mg total) by mouth daily. 11/17/21   Farrel Gordon, DO  Blood Glucose Monitoring Suppl (ACCU-CHEK GUIDE) w/Device KIT Check blood  sugars daily 02/16/22   Lajean Manes, MD  calamine lotion Apply 1 application topically as needed for itching. 09/12/19   Chundi, Verne Spurr, MD  cholecalciferol (VITAMIN D3) 25 MCG (1000 UNIT) tablet Take 1 tablet (1,000 Units total) by mouth daily. 02/16/22   Velna Ochs, MD  cyanocobalamin 1000 MCG tablet Take 1 tablet by mouth daily. 02/06/19   [provider]  cyclobenzaprine (FLEXERIL) 5  MG tablet TAKE ONE TABLET BY MOUTH THREE TIMES DAILY AS NEEDED FOR MUSCLE SPASMS 09/02/22   [name], MD  diclofenac Sodium (VOLTAREN) 1 % GEL APPLY 4 GRAMS TOPICALLY TO LEFT KNEE FOUR TIMES DAILY. 01/12/22   [name], MD  Dulaglutide (TRULICITY) 3 ZJ/6.9CV SOPN Inject 3 mg as directed once a week. 03/25/22   [name], MD  Ergocalciferol 10 MCG (400 UNIT) TABS Take 800 Units by mouth daily. 11/20/21   Farrel Gordon, DO  escitalopram (LEXAPRO) 10 MG tablet Take 1 tablet (10 mg total) by mouth daily. 04/27/22 08/25/22  Wayland Denis, MD  fluticasone (CUTIVATE) 0.05 % cream APPLY TO THE AFFECTED AREA(S) TWICE DAILY 04/19/22   [name], MD  folic acid (FOLVITE) 1 MG tablet TAKE ONE TABLET BY MOUTH EVERY DAY 06/07/22   [name], MD  furosemide (LASIX) 40 MG tablet TAKE ONE TABLET BY MOUTH DAILY 06/22/22   Larey Dresser, MD  gabapentin (NEURONTIN) 300 MG capsule TAKE ONE CAPSULE BY MOUTH TWICE DAILY 06/14/22   [name], MD  glucose blood (ACCU-CHEK GUIDE) test strip USE TO CHECK BLOOD SUGAR TWICE DAILY 02/16/22   Lajean Manes, MD  hydrALAZINE (APRESOLINE) 25 MG tablet Take 1 tablet (25 mg total) by mouth 3 (three) times daily. 07/17/21 07/17/22  Larey Dresser, MD  Insulin Glargine [facility]) 100 UNIT/ML Inject 30 Units into the skin daily. 03/29/22   Jinwala,  Soyla Murphy, MD  isosorbide mononitrate (IMDUR) 60 MG 24 hr tablet TAKE ONE AND ONE-HALF TABLETS BY MOUTH EVERY DAY 03/08/22   Larey Dresser, MD  KLOR-CON M20 20 MEQ tablet TAKE ONE TABLET BY MOUTH DAILY 06/22/22   Larey Dresser, MD  levETIRAcetam (KEPPRA) 500 MG tablet Take 1 tablet (500 mg total) by mouth 2 (two) times daily. 01/26/22   [name], MD  LINZESS 290 MCG CAPS capsule TAKE 1 CAPSULE (290 MCG TOTAL) BY MOUTH DAILY AS NEEDED (CONSTIPATION). 02/19/22   [name], MD  metoprolol succinate (TOPROL-XL) 100 MG 24 hr tablet TAKE ONE TABLET BY MOUTH EVERY DAY. last refill UNTIL office visit [phone] TO  schedule 06/22/22   Larey Dresser, MD  nystatin powder Apply 1 application. topically 3 (three) times daily. 02/16/22   Velna Ochs, MD  pantoprazole (PROTONIX) 40 MG tablet TAKE ONE TABLET BY MOUTH EVERY DAY 03/09/22   [name], MD  solifenacin (VESICARE) 10 MG tablet TAKE ONE TABLET BY MOUTH EVERY DAY 02/22/22   [name], MD  spironolactone (ALDACTONE) 100 MG tablet Take 1 tablet (100 mg total) by mouth daily. 02/16/22   Velna Ochs, MD  STOOL SOFTENER/LAXATIVE 50-8.6 MG tablet TAKE ONE TABLET BY MOUTH AT BEDTIME AS NEEDED FOR MILD CONSTIPATION 01/28/20   Velna Ochs, MD  XARELTO 20 MG TABS tablet TAKE ONE TABLET BY MOUTH EVERY DAY WITH SUPPER 04/23/22   [name], MD      Allergies    Ace inhibitors, Clindamycin, Enalapril maleate, and Lisinopril    Review of Systems   Review of Systems  Cardiovascular:  Positive for chest pain.  All other systems reviewed and are negative.   Physical Exam Updated Vital Signs There were no vitals taken for this visit. Physical Exam Vitals and nursing note reviewed.  Constitutional:      Appearance: She is well-developed.  HENT:     Head: Normocephalic and atraumatic.  Cardiovascular:     Rate and Rhythm: Regular rhythm. Tachycardia present.     Heart sounds: No murmur heard. Pulmonary:     Effort: Pulmonary effort is normal. No respiratory distress.     Breath sounds: Normal breath sounds.  Abdominal:     Palpations: Abdomen is soft.     Tenderness: There is no abdominal tenderness. There is no guarding or rebound.  Musculoskeletal:        General: No tenderness.     Comments: 2+ DP pulses bilaterally.  There is nonpitting edema to the left lower extremity  Skin:    General: Skin is warm and dry.  Neurological:     Mental Status: She is alert and oriented to person, place, and time.  Psychiatric:        Behavior: Behavior normal.     ED Results / Procedures / Treatments   Labs (all labs ordered are  listed, but only abnormal results are displayed) Labs Reviewed - No data to display  EKG None  Radiology No results found.  Procedures Procedures    Medications Ordered in ED Medications - No data to display  ED Course/ Medical Decision Making/ A&P                           Medical Decision Making Amount and/or Complexity of Data Reviewed Labs: ordered. Radiology: ordered.  Risk OTC drugs. Prescription drug management.   Patient with history of CHF, CVA, diabetes here for evaluation of sharp left-sided chest pain  in setting of verbal altercation.  Patient is tachycardic on evaluation, well perfused.  She has symmetric pulses.  EKG with paced rhythm.  She was treated with fentanyl for pain with resolution of chest pain.  Initial troponin is negative.  Given her persistent tachycardia, chest pain a CTA dissection was obtained.  CTA is negative for dissection but does have findings concerning for UTI.  Patient does report increased urination with increased frequency as well as incontinence and chafing to her privates.  She has not had a Foley catheter in place.  We will treat with antibiotics for presumed UTI.  Care transferred pending urinalysis, repeat troponin.        Final Clinical Impression(s) / ED Diagnoses Final diagnoses:  None    Rx / DC Orders ED Discharge Orders     None         Quintella Reichert, MD 09/12/22 [id number]    Quintella Reichert, MD 09/12/22 [phone]

## 2022-09-12 NOTE — Progress Notes (Signed)
Pharmacy Antibiotic Note  [name] is a 55 y.o. 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  Pharmacy has been consulted for Zosyn (piperacillin-tazobactam) dosing.  WBC 10.8, Tmax 98.6 Scr 1.4 (baseline ~0.8-1)  Ceftriaxone 1g IV x1 given in ED on 10/1 at 07:30  Plan: Zosyn 3.375g IV q8h (4 hour infusion). Continue fluconazole 150mg  q72h x 3 doses per MD Monitor daily CBC, temp, SCr, and for clinical signs of improvement  F/u cultures and de-escalate antibiotics as able   Height: 5\' 9"  (175.3 cm) Weight: 65.8 kg (145 lb) IBW/kg (Calculated) : 66.2  Temp (24hrs), Avg:98.2 F (36.8 C), Min:97.8 F (36.6 C), Max:98.6 F (37 C)  Recent Labs  Lab 09/12/22 0415  WBC 10.8*  CREATININE 1.40*    Estimated Creatinine Clearance: 46.6 mL/min (A) (by C-G formula based on SCr of 1.4 mg/dL (H)).    Allergies  Allergen Reactions   Ace Inhibitors Anaphylaxis and Other (See Comments)    Swelling of the tongue and throat    Cleocin [Clindamycin] Anaphylaxis and Swelling   Enalapril Maleate Anaphylaxis and Other (See Comments)    Swelling of the tongue and throat    Zestril [Lisinopril] Anaphylaxis and Other (See Comments)    Swelling of the tongue and throat     Antimicrobials this admission: Ceftriaxone 10/1 x1 Zosyn 10/1 >>  Fluconazole 10/1 >>   Dose adjustments this admission: N/A  Microbiology results: 10/1 UCx:   Thank you for allowing pharmacy to be a part of this patient's care.  Luisa Hart, PharmD, BCPS Clinical Pharmacist 09/12/2022 11:30 AM   Please refer to [facility] for pharmacy phone number

## 2022-09-12 NOTE — ED Notes (Signed)
Patient repositioned due to wound on butt area. Wound nurse came in to assess and change the bandage.

## 2022-09-12 NOTE — ED Notes (Signed)
Pt has stage 3 sacrum ulcer on bottom. RN placed sacrum foam pad and notified provider.

## 2022-09-12 NOTE — ED Notes (Signed)
[employer] device interrogated

## 2022-09-12 NOTE — ED Notes (Signed)
Patient eating dinner, family at bedside.

## 2022-09-12 NOTE — Hospital Course (Addendum)
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 Lately a lot of infections in her urine.  Had some itching of the skin around her vaginal itroitus. Thought to be a yeast infection.   Been told has air in her bladder before.   10/4 - feeling much better today. Denies fevers, cold but always cold, Eating ok. Wants to eat applesauce. Asks when she'll go home. Denies ptp of abdomen. States urologist said bladder had stroke, placed on vesicare but this didn't help. Still taking this before coming in. (Be sure to hold this at dc at revisit with urologist.) Home with urologist and likely repeat voiding trial. Discussed possibility of needing permanent catheter. She is ok with going home with foley.

## 2022-09-12 NOTE — H&P (Cosign Needed Addendum)
Date: 09/12/2022               Patient Name:  [name] MRN: [id number]  DOB: [birth year] Age / Sex: 56 y.o., female   PCP: Merrilyn Puma, MD         Medical Service: Internal Medicine Teaching Service         Attending Physician: Dr. Dickie La, MD    First Contact: Dr. Thomasene Ripple Pager: [phone]  Second Contact: Dr. 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   States that her chest started hurting yesterday after an argument with her daughter. Chest pain was on the L side under her breast. Did not radiate anywhere. Felt heaviness on her chest like someone was sitting on it. Her pain resolved after she received aspirin and nitroglycerin from EMS. She denies any current chest pain, SHOB, palpitations, N/V, abdominal pain.  Of note, patient endorses burning with urination, vaginal itching, and swelling around her vagina that has been present for the past week. She denies any fevers, chills, headaches, abdominal pain, hematuria during this time. She reports having a history of recurrent yeast infections and UTIs, dating back since after she suffered from a stroke about 9 or so years ago. She also reports a history of urinary incontinence, primarily because she is unable to get to the restroom in time due to hemiparesis following her stroke. She thinks that this is what causes her frequent UTIs and yeast infection. Does not have good support from her kids, who often make negative comments to her about  her inability to reach the bathroom in time.  She reports going through a difficult time currently, primarily because she does not have stable housing. She has been staying with friends and bouncing around different motels. Would like to see if there are any options where she could get more help with her daily needs and help with medications.  Meds:  No current facility-administered medications on file prior to encounter.   Current Outpatient Medications on File Prior to Encounter  Medication Sig Dispense Refill   amLODipine (NORVASC) 10 MG tablet TAKE ONE TABLET BY MOUTH EVERY DAY (Patient taking differently: Take 10 mg by mouth daily.) 90 tablet 0   atorvastatin (LIPITOR) 40 MG tablet Take 1 tablet (40 mg total) by mouth daily. 90 tablet 2   calamine lotion Apply 1 application topically as needed for itching. 120 mL 0   cyclobenzaprine (FLEXERIL) 5 MG tablet TAKE ONE TABLET BY MOUTH THREE TIMES DAILY AS NEEDED FOR MUSCLE SPASMS (Patient taking differently: Take 5 mg by mouth 3 (three) times daily as needed for muscle spasms.) 30 tablet 1   diclofenac Sodium (VOLTAREN) 1 % GEL APPLY 4 GRAMS TOPICALLY TO LEFT KNEE FOUR TIMES DAILY. (Patient taking differently: Apply 4 g topically 4 (four) times daily. Left knee) 300 g 2   Dulaglutide (TRULICITY) 3 MG/0.5ML SOPN Inject 3 mg as directed once a week. 9 mL 2   escitalopram (LEXAPRO) 10 MG tablet Take 1 tablet (10  mg total) by mouth daily. 60 tablet 1   fluticasone (CUTIVATE) 0.05 % cream APPLY TO THE AFFECTED AREA(S) TWICE DAILY (Patient taking differently: Apply 1 Application topically 2 (two) times daily.) 30 g 6   folic acid (FOLVITE) 1 MG tablet TAKE ONE TABLET BY MOUTH EVERY DAY (Patient taking differently: Take 1 mg by mouth daily.) 90 tablet 1   furosemide (LASIX) 40 MG tablet TAKE ONE TABLET BY MOUTH DAILY (Patient taking differently: Take 40 mg by mouth 2 (two) times a week.) 90 tablet 3   gabapentin (NEURONTIN) 300 MG capsule TAKE ONE  CAPSULE BY MOUTH TWICE DAILY (Patient taking differently: Take 300 mg by mouth 2 (two) times daily.) 90 capsule 1   hydrALAZINE (APRESOLINE) 25 MG tablet Take 1 tablet (25 mg total) by mouth 3 (three) times daily. 90 tablet 11   isosorbide mononitrate (IMDUR) 60 MG 24 hr tablet TAKE ONE AND ONE-HALF TABLETS BY MOUTH EVERY DAY (Patient taking differently: Take 90 mg by mouth daily.) 135 tablet 2   levETIRAcetam (KEPPRA) 500 MG tablet Take 1 tablet (500 mg total) by mouth 2 (two) times daily. 180 tablet 3   LINZESS 290 MCG CAPS capsule TAKE 1 CAPSULE (290 MCG TOTAL) BY MOUTH DAILY AS NEEDED (CONSTIPATION). (Patient taking differently: Take 290 mcg by mouth daily as needed (Constipation).) 30 capsule 2   metoprolol succinate (TOPROL-XL) 100 MG 24 hr tablet TAKE ONE TABLET BY MOUTH EVERY DAY. last refill UNTIL office visit [phone] TO schedule (Patient taking differently: Take 100 mg by mouth daily.) 30 tablet 0   pantoprazole (PROTONIX) 40 MG tablet TAKE ONE TABLET BY MOUTH EVERY DAY (Patient taking differently: Take 40 mg by mouth daily.) 90 tablet 3   solifenacin (VESICARE) 10 MG tablet TAKE ONE TABLET BY MOUTH EVERY DAY (Patient taking differently: Take 10 mg by mouth daily.) 30 tablet 5   spironolactone (ALDACTONE) 100 MG tablet Take 1 tablet (100 mg total) by mouth daily. 30 tablet 2   STOOL SOFTENER/LAXATIVE 50-8.6 MG tablet TAKE ONE TABLET BY MOUTH AT BEDTIME AS NEEDED FOR MILD CONSTIPATION (Patient taking differently: Take 1 tablet by mouth at bedtime as needed for mild constipation.) 30 tablet 0   Vitamin D, Ergocalciferol, (DRISDOL) 1.25 MG (50000 UNIT) CAPS capsule Take 50,000 Units by mouth every 7 (seven) days.     XARELTO 20 MG TABS tablet TAKE ONE TABLET BY MOUTH EVERY DAY WITH SUPPER (Patient taking differently: Take 20 mg by mouth daily with supper.) 30 tablet 2   cyanocobalamin 1000 MCG tablet Take 1 tablet by mouth daily.     KLOR-CON M20 20 MEQ tablet TAKE ONE TABLET BY MOUTH DAILY  (Patient not taking: Reported on 09/12/2022) 90 tablet 3      Allergies: Allergies as of 09/12/2022 - Review Complete 09/12/2022  Allergen Reaction Noted   Ace inhibitors Anaphylaxis and Other (See Comments) 07/04/2014   Cleocin [clindamycin] Anaphylaxis and Swelling    Enalapril maleate Anaphylaxis and Other (See Comments)    Zestril [lisinopril] Anaphylaxis and Other (See Comments)    Past Medical History:  Diagnosis Date   Acute cystitis 06/19/2021   Allergic rhinitis    Arthritis    "hips, back, legs, arms" (07/04/2014)   Asthma    hx   Automatic implantable cardioverter-defibrillator in situ    Calcifying tendinitis of shoulder    Chronic combined systolic and diastolic CHF (congestive heart failure) (HCC)    EF 40-45% by echo 12/06/2012   Chronic diastolic heart failure (HCC)  Primarily diastolic CHF: Likely due to uncontrolled HTN. Last echo (8/12) with EF 45-50%, mild to moderate LVH with some asymmetric septal hypertrophy, RV normal size and systolic function. EF 50-55% by LV-gram in 6/12.    Chronic lower back pain    secondary to DJD, obsetiy, hip problems. Followed by Dr. Oval Linsey (pain management)   Chronic systolic heart failure ([place]) 05/17/2022   Contact with and (suspected) exposure to covid-19 02/07/2020   Coronary artery disease    questionable. [place] 05/2011 showing normal coronaries // Followed at [facility], Dr. Aundra Dubin   Degeneration of lumbar or lumbosacral intervertebral disc    DJD (degenerative joint disease) of hip    right sided   Fatigue 04/27/2022   Frequent UTI    GERD (gastroesophageal reflux disease)    HLD (hyperlipidemia)    Hypertension    Poorly controlled. Has had HTN since age 71. Angioedema with ACEI.  24 Hr urine and renal arterial dopplers ordered . . . Never done   LBBB (left bundle branch block)    Left shoulder pain 06/30/2015   Left spastic hemiparesis ([place]) 09/23/2015   Liver disease    Lumbago 11/22/2016   Morbid obesity  ([place])    Muscle spasm 06/19/2021   Need for pneumococcal vaccine 09/09/2017   NICM (nonischemic cardiomyopathy) ([place])    EF 45-50% in 8/12, cath 6/12 showed normal coronaries, EF 50-55% by LV gram   OSA on CPAP    sleep study in 8/12 showed moderate to severe OSA requiring CPAP   Perimenopausal 03/28/2017   Polyneuropathy in diabetes(357.2)    Presence of permanent cardiac pacemaker    Rash 07/14/2016   Seizures ([place])    last 3 months   Shortness of breath    none now   Stroke [facility]) 12/2013   "my left side is paralyzed" (07/04/2014)   Thoracic or lumbosacral neuritis or radiculitis, unspecified    Type II diabetes mellitus ([place]) DX: 2002   Urinary frequency 11/05/2014   Vaginal discharge 02/05/2021    Family History:  Family History  Problem Relation Age of Onset   Heart disease Mother 54       Died of MI at age 44 yo   Kidney disease Mother        requiring dialysis   Congestive Heart Failure Mother    Heart disease Father 52       MI age 40yo requiring stenting   Diabetes Father    Glaucoma Father    Heart disease Paternal Grandmother        requiring pacemaker.   Heart disease Paternal Grandfather 34       Died of MI at possibly age 66-53yo   Stroke Paternal Grandfather    Diabetes Brother    Heart disease Brother 63       MI at age 55 years old   Breast cancer Paternal Aunt    Breast cancer Maternal Grandmother      Social History:  Social History   Socioeconomic History   Marital status: Single    Spouse name: Not on file   Number of children: 2   Years of education: 9th grade   Highest education level: Not on file  Occupational History   Occupation: Unemployed    Comment: planning on getting disability  Tobacco Use   Smoking status: Never   Smokeless tobacco: Never  Vaping Use   Vaping Use: Never used  Substance and Sexual Activity   Alcohol use: No  Alcohol/week: 0.0 standard drinks of alcohol   Drug use: No   Sexual activity: Not on file  Other  Topics Concern   Not on file  Social History Narrative   Lives in [place] with her son. Is able to read and write fluently in [place].   Social Determinants of Health   Financial Resource Strain: High Risk (05/19/2021)   Overall Financial Resource Strain (CARDIA)    Difficulty of Paying Living Expenses: Hard  Food Insecurity: No Food Insecurity (06/10/2022)   Hunger Vital Sign    Worried About Running Out of Food in the Last Year: Never true    Ran Out of Food in the Last Year: Never true  Transportation Needs: No Transportation Needs (06/10/2022)   PRAPARE - Administrator, Civil Service (Medical): No    Lack of Transportation (Non-Medical): No  Physical Activity: Not on file  Stress: Not on file  Social Connections: Not on file  Intimate Partner Violence: Not on file     Review of Systems: A complete ROS was negative except as per HPI.   Physical Exam: Blood pressure (!) 149/86, pulse (!) 108, temperature 97.8 F (36.6 C), temperature source Oral, resp. rate (!) 22, height 5\' 9"  (1.753 m), weight 65.8 kg, last menstrual period 09/13/2019, SpO2 92 %. Physical Exam Exam conducted with a chaperone present.  Constitutional:      Comments: Chronically ill appearing middle aged female, laying in bed, NAD.  HENT:     Mouth/Throat:     Mouth: Mucous membranes are dry.     Pharynx: Oropharynx is clear. No oropharyngeal exudate.  Eyes:     General: No scleral icterus.    Extraocular Movements: Extraocular movements intact.     Pupils: Pupils are equal, round, and reactive to light.  Cardiovascular:     Rate and Rhythm: Tachycardia present.     Pulses: Normal pulses.     Heart sounds: No murmur heard.    No friction rub. No gallop.     Comments: Ventricularly paced rhythm. No JVD noted. Pulmonary:     Effort: Pulmonary effort is normal.     Breath sounds: Normal breath sounds. No wheezing, rhonchi or rales.  Abdominal:     General: Bowel sounds are normal. There  is no distension.     Palpations: Abdomen is soft. There is no mass.     Tenderness: There is no abdominal tenderness. There is no right CVA tenderness, left CVA tenderness, guarding or rebound.  Genitourinary:    Comments: Swelling noted on lateral aspects of external vagina and vulva. Redness with associated skin chaffing noted in inguinal areas bilaterally. Foley catheter in place. Musculoskeletal:     Comments: Trace pitting edema in LLE, no edema noted in RLE.  Skin:    Comments: Mildly cool extremities.  Neurological:     Mental Status: She is alert and oriented to person, place, and time.     Comments: Left hemiparesis, chronic  Psychiatric:        Behavior: Behavior normal.        Thought Content: Thought content normal.        Judgment: Judgment normal.     Comments: Depressed mood     EKG: personally reviewed my interpretation is ventricularly paced rhythm, no acute ischemic changes.   CT Angio Chest/Abd/Pel for Dissection W and/or W/WO Negative Adrenals/Urinary Tract: Negative adrenals. Bilateral hydroureteronephrosis to the level of the thick walled bladder which also contains gas. Prominent perinephric fat  stranding. Moderate distension of the bladder, a gas containing diverticulum is seen at the dome. Musculoskeletal: Lumbar spine degeneration with especially advanced disc degeneration at L2-3  IMPRESSION: 1. Negative for acute aortic syndrome. Atherosclerosis of the abdominal aorta. 2. Bilateral hydroureteronephrosis to the level of the thick walled bladder which shows luminal gas and perinephric stranding, presumed UTI. Electronically Signed   By: Tiburcio Pea M.D.   On: 09/12/2022 06:58   DG Chest Port 1 View IMPRESSION: 1. No active cardiopulmonary abnormalities. Electronically Signed   By: Signa Kell M.D.   On: 09/12/2022 04:23     Assessment & Plan by Problem: Principal Problem:   Emphysematous cystitis Active Problems:   OSA (obstructive sleep apnea)    Hyperlipidemia   Depression   Chronic combined systolic and diastolic heart failure (HCC)   Mixed stress and urge urinary incontinence   ICD (implantable cardioverter-defibrillator) in place   Hemiparesis affecting left side as late effect of cerebrovascular accident (HCC)   Morbid obesity (HCC)   Poorly controlled type 2 diabetes mellitus with neuropathy (HCC)   Atrial fibrillation (HCC)  Emphysematous Cystitis Bilateral Hydroureteronephrosis Vulvovaginal candidiasis AKI Patient found to have incidental findings on CT angio chest/abd/pel showing bilateral hydroureteronephrosis to the level of the the thick walled bladder with luminal gas and perinephric stranding, concerning for emphysematous cystitis. She does endorse burning with urination, vaginal itching, swelling, and a history of recurrent UTIs/yeast infections since the time of her stroke. UA revealed hematuria, large leukocytes, >50 WBCs, many bacteria, and budding yeast. Her recurrent GU infections are likely due to mixed incontinence (chronic) in the setting of hemiparesis (inability to reach commode in time). She does not appear septic on exam and no CVA tenderness noted to suggest pyelonephritis. Foley has been placed per EDP for bladder decompression. Of note, will be important to have good glycemic control to ensure adequate treatment of infection (patient with history of poorly controlled T2DM). Patient does have an AKI, likely prerenal (from poor PO intake) and possibly post-renal from hydroureteronephrosis, expect improvement with PO fluid intake and bladder decompression with foley. -zosyn per pharmacy -diflucan 150mg  every 3 days for 3 doses -foley catheter -ensure good glycemic control (see below) -f/u urine culture and sensitivities to tailor antibiotics -trend BMP, CBC -monitor urine output with foley in place -avoid nephrotoxic medications  Anginal chest pain Chronic combined systolic and diastolic HF NICM s/p ICD  placement Atrial Fibrillation, on xarelto HTN Presented with anginal chest pain, which resolved with nitroglycerin en route to ED. She no longer has any chest pain. Troponins were flat and EKG with no acute ischemic changes noted. ICD was interrogated in the ED without any abnormalities noted. CT angio chest/abd/pel without any acute abnormalities as well. Does not appear to be ACS. Last ECHO in 07/2021 with LVEF 25%, global hypokinesis, and G2DD. She appears fairly euvolemic (maybe slightly dry) on exam. She typically takes her lasix on Mondays and Fridays per her cardiologist and, given exam findings, will hold lasix today. She was slightly tachycardic, but also has not taken her metoprolol since yesterday morning. -resume home toprol-xl 100mg  daily -resume home xarelto -resume home hydralazine 25mg  TID and imdur 60mg  daily -holding home norvasc, can resume if remains hypertensive -holding home spironolactone given AKI  Poorly controlled T2DM with neuropathy Patient has longstanding uncontrolled T2DM with last A1c of 9.5% in 02/2022. She is on trulicity 3mg  weekly and basaglar 30u daily at home. CBG on arrival was 467. Will need to ensure good glycemic  control for more optimal treatment of infection (see above). -f/u A1c -semglee 30u daily -moderate SSI -trend CBGs -home gabapentin for neuropathy  OSA Chronic and stable. Uses CPAP at home. -continue CPAP qhs  Depression Housing Instability Patient with history of depression, managed with lexapro 10mg  daily. She did share with me that she is currently facing housing instability, living with friends and in motels. She does not appear to have good outpatient support from her family, including her children. -continue home lexapro -TOC consult to help find affordable housing options for safe dispo planning -PT/OT eval in this middle-aged lady with hemiparesis to see if she would qualify for higher level of care  CVA with residual L  hemiparesis Has residual L hemiparesis from a stroke 9 years ago. Residual deficits are likely playing a large role in subsequent deterioration in functional status. Will have PT/OT evaluate and see if patient may qualify for higher level of care. -continue home lipitor -continue xarelto -PT/OT eval  Dispo: Admit patient to Observation with expected length of stay less than 2 midnights.  Signed: , MD 09/12/2022, 12:19 PM  Pager: [phone] After 5pm on weekdays and 1pm on weekends: On Call pager: [phone]

## 2022-09-12 NOTE — ED Notes (Signed)
Patient repositioned from sitting position on side of bed to supine per request, heels floated.

## 2022-09-12 NOTE — ED Provider Notes (Signed)
Care of patient assumed from Dr. Ralene Bathe at 7:30 PM.  This patient presents for acute onset of chest pain.  This occurred in a setting of having argument with her daughter last night.  Pacemaker device was interrogated and showed no recent events.  Initial troponin is normal.  She has a history of CHF but BNP is normal.  CTA dissection study had incidental finding of emphysematous cystitis and bladder distention.  Ceftriaxone has been ordered.  She will need a repeat troponin prior to admission. Physical Exam  BP 130/81 (BP Location: Right Arm)   Pulse (!) 102   Temp 98.6 F (37 C) (Oral)   Resp 16   Ht 5\' 9"  (1.753 m)   Wt 65.8 kg   LMP 09/13/2019 (Exact Date)   SpO2 98%   BMI 21.41 kg/m   Physical Exam Vitals and nursing note reviewed.  Constitutional:      General: She is not in acute distress.    Appearance: She is well-developed. She is not ill-appearing, toxic-appearing or diaphoretic.  HENT:     Head: Normocephalic and atraumatic.  Eyes:     Conjunctiva/sclera: Conjunctivae normal.  Cardiovascular:     Rate and Rhythm: Regular rhythm. Tachycardia present.     Heart sounds: No murmur heard. Pulmonary:     Effort: Pulmonary effort is normal. No tachypnea.  Abdominal:     Palpations: Abdomen is soft.     Tenderness: There is no abdominal tenderness.  Musculoskeletal:        General: No swelling.     Cervical back: Normal range of motion and neck supple.  Skin:    General: Skin is warm and dry.  Neurological:     General: No focal deficit present.     Mental Status: She is alert and oriented to person, place, and time.  Psychiatric:        Mood and Affect: Mood normal.        Behavior: Behavior normal.     Procedures  Procedures  ED Course / MDM    Medical Decision Making Amount and/or Complexity of Data Reviewed Labs: ordered. Radiology: ordered.  Risk OTC drugs. Prescription drug management. Decision regarding hospitalization.   On assessment, patient  resting comfortably.  She continues to endorse resolution of chest pain.  Vital signs show tachycardia in the range of 110.  This is atrial sensed pacing.  Patient endorses a history of urinary incontinence since her stroke 8 years ago.  She is on a pure wick and does have urine output.  Given distention of her bladder on CTA, I do suspect overflow incontinence.  Foley catheter was ordered.  Repeat troponin was normal.  Patient was admitted to medicine for further management.       Godfrey Pick, MD 09/12/22 [phone]

## 2022-09-13 ENCOUNTER — Observation Stay (HOSPITAL_COMMUNITY): Payer: Medicaid Other

## 2022-09-13 ENCOUNTER — Encounter (HOSPITAL_COMMUNITY): Payer: Self-pay | Admitting: Internal Medicine

## 2022-09-13 DIAGNOSIS — G4733 Obstructive sleep apnea (adult) (pediatric): Secondary | ICD-10-CM | POA: Diagnosis present

## 2022-09-13 DIAGNOSIS — E785 Hyperlipidemia, unspecified: Secondary | ICD-10-CM | POA: Diagnosis present

## 2022-09-13 DIAGNOSIS — N179 Acute kidney failure, unspecified: Secondary | ICD-10-CM | POA: Diagnosis present

## 2022-09-13 DIAGNOSIS — B356 Tinea cruris: Secondary | ICD-10-CM

## 2022-09-13 DIAGNOSIS — G8929 Other chronic pain: Secondary | ICD-10-CM | POA: Diagnosis present

## 2022-09-13 DIAGNOSIS — I5042 Chronic combined systolic (congestive) and diastolic (congestive) heart failure: Secondary | ICD-10-CM | POA: Diagnosis present

## 2022-09-13 DIAGNOSIS — E1142 Type 2 diabetes mellitus with diabetic polyneuropathy: Secondary | ICD-10-CM | POA: Diagnosis present

## 2022-09-13 DIAGNOSIS — N1330 Unspecified hydronephrosis: Secondary | ICD-10-CM

## 2022-09-13 DIAGNOSIS — N3080 Other cystitis without hematuria: Secondary | ICD-10-CM

## 2022-09-13 DIAGNOSIS — E1165 Type 2 diabetes mellitus with hyperglycemia: Secondary | ICD-10-CM | POA: Diagnosis present

## 2022-09-13 DIAGNOSIS — Z95810 Presence of automatic (implantable) cardiac defibrillator: Secondary | ICD-10-CM | POA: Diagnosis not present

## 2022-09-13 DIAGNOSIS — N3081 Other cystitis with hematuria: Secondary | ICD-10-CM | POA: Diagnosis present

## 2022-09-13 DIAGNOSIS — R079 Chest pain, unspecified: Secondary | ICD-10-CM

## 2022-09-13 DIAGNOSIS — Z7901 Long term (current) use of anticoagulants: Secondary | ICD-10-CM | POA: Diagnosis not present

## 2022-09-13 DIAGNOSIS — Z79899 Other long term (current) drug therapy: Secondary | ICD-10-CM | POA: Diagnosis not present

## 2022-09-13 DIAGNOSIS — F32A Depression, unspecified: Secondary | ICD-10-CM | POA: Diagnosis present

## 2022-09-13 DIAGNOSIS — I4891 Unspecified atrial fibrillation: Secondary | ICD-10-CM | POA: Diagnosis present

## 2022-09-13 DIAGNOSIS — I25119 Atherosclerotic heart disease of native coronary artery with unspecified angina pectoris: Secondary | ICD-10-CM | POA: Diagnosis present

## 2022-09-13 DIAGNOSIS — E860 Dehydration: Secondary | ICD-10-CM | POA: Diagnosis present

## 2022-09-13 DIAGNOSIS — B372 Candidiasis of skin and nail: Secondary | ICD-10-CM | POA: Diagnosis present

## 2022-09-13 DIAGNOSIS — I69354 Hemiplegia and hemiparesis following cerebral infarction affecting left non-dominant side: Secondary | ICD-10-CM | POA: Diagnosis not present

## 2022-09-13 DIAGNOSIS — I110 Hypertensive heart disease with heart failure: Secondary | ICD-10-CM | POA: Diagnosis present

## 2022-09-13 DIAGNOSIS — K219 Gastro-esophageal reflux disease without esophagitis: Secondary | ICD-10-CM | POA: Diagnosis present

## 2022-09-13 DIAGNOSIS — Z59819 Housing instability, housed unspecified: Secondary | ICD-10-CM | POA: Diagnosis not present

## 2022-09-13 DIAGNOSIS — Z794 Long term (current) use of insulin: Secondary | ICD-10-CM | POA: Diagnosis not present

## 2022-09-13 HISTORY — DX: Tinea cruris: B35.6

## 2022-09-13 HISTORY — DX: Acute kidney failure, unspecified: N17.9

## 2022-09-13 LAB — CBC WITH DIFFERENTIAL/PLATELET
Abs Immature Granulocytes: 0.05 10*3/uL (ref 0.00–0.07)
Basophils Absolute: 0 10*3/uL (ref 0.0–0.1)
Basophils Relative: 0 %
Eosinophils Absolute: 0.1 10*3/uL (ref 0.0–0.5)
Eosinophils Relative: 1 %
HCT: 29.4 % — ABNORMAL LOW (ref 36.0–46.0)
Hemoglobin: 9.1 g/dL — ABNORMAL LOW (ref 12.0–15.0)
Immature Granulocytes: 0 %
Lymphocytes Relative: 34 %
Lymphs Abs: 3.9 10*3/uL (ref 0.7–4.0)
MCH: 27.6 pg (ref 26.0–34.0)
MCHC: 31 g/dL (ref 30.0–36.0)
MCV: 89.1 fL (ref 80.0–100.0)
Monocytes Absolute: 1 10*3/uL (ref 0.1–1.0)
Monocytes Relative: 9 %
Neutro Abs: 6.3 10*3/uL (ref 1.7–7.7)
Neutrophils Relative %: 56 %
Platelets: 212 10*3/uL (ref 150–400)
RBC: 3.3 MIL/uL — ABNORMAL LOW (ref 3.87–5.11)
RDW: 13.6 % (ref 11.5–15.5)
WBC: 11.4 10*3/uL — ABNORMAL HIGH (ref 4.0–10.5)
nRBC: 0 % (ref 0.0–0.2)

## 2022-09-13 LAB — GLUCOSE, CAPILLARY
Glucose-Capillary: 250 mg/dL — ABNORMAL HIGH (ref 70–99)
Glucose-Capillary: 168 mg/dL — ABNORMAL HIGH (ref 70–99)
Glucose-Capillary: 240 mg/dL — ABNORMAL HIGH (ref 70–99)

## 2022-09-13 LAB — HIV ANTIBODY (ROUTINE TESTING W REFLEX): HIV Screen 4th Generation wRfx: NONREACTIVE

## 2022-09-13 LAB — ECHOCARDIOGRAM COMPLETE
Area-P 1/2: 5.27 cm2
Height: 69 in
S' Lateral: 3.4 cm
Single Plane A4C EF: 17.7 %
Weight: 2320 oz

## 2022-09-13 LAB — BASIC METABOLIC PANEL
Anion gap: 7 (ref 5–15)
BUN: 12 mg/dL (ref 6–20)
CO2: 28 mmol/L (ref 22–32)
Calcium: 8.4 mg/dL — ABNORMAL LOW (ref 8.9–10.3)
Chloride: 101 mmol/L (ref 98–111)
Creatinine, Ser: 1.28 mg/dL — ABNORMAL HIGH (ref 0.44–1.00)
GFR, Estimated: 49 mL/min — ABNORMAL LOW (ref >60)
Glucose, Bld: 215 mg/dL — ABNORMAL HIGH (ref 70–99)
Potassium: 4 mmol/L (ref 3.5–5.1)
Sodium: 136 mmol/L (ref 135–145)

## 2022-09-13 LAB — URINE CULTURE: Culture: 10000 — AB

## 2022-09-13 LAB — HEMOGLOBIN A1C
Hgb A1c MFr Bld: 13.3 % — ABNORMAL HIGH (ref 4.8–5.6)
Mean Plasma Glucose: 335.01 mg/dL

## 2022-09-13 LAB — CBG MONITORING, ED: Glucose-Capillary: 200 mg/dL — ABNORMAL HIGH (ref 70–99)

## 2022-09-13 MED ORDER — CLOTRIMAZOLE 1 % EX CREA
TOPICAL_CREAM | Freq: Two times a day (BID) | CUTANEOUS | Status: DC
  Filled 2022-09-13: qty 15

## 2022-09-13 MED ORDER — RIVAROXABAN 20 MG PO TABS
20.0000 mg | ORAL_TABLET | Freq: Every day | ORAL | Status: DC
  Administered 2022-09-13 – 2022-09-14 (×2): 20 mg via ORAL
  Filled 2022-09-13 (×2): qty 1

## 2022-09-13 MED ORDER — INSULIN ASPART 100 UNIT/ML IJ SOLN
5.0000 [IU] | Freq: Three times a day (TID) | INTRAMUSCULAR | Status: DC
  Administered 2022-09-13 – 2022-09-14 (×3): 5 [IU] via SUBCUTANEOUS

## 2022-09-13 MED ORDER — ORAL CARE MOUTH RINSE: 15.0000 mL | OROMUCOSAL | Status: DC | PRN

## 2022-09-13 MED ORDER — CHLORHEXIDINE GLUCONATE CLOTH 2 % EX PADS
6.0000 | MEDICATED_PAD | Freq: Every day | CUTANEOUS | Status: DC
  Administered 2022-09-15: 6 via TOPICAL

## 2022-09-13 MED ORDER — INSULIN GLARGINE-YFGN 100 UNIT/ML ~~LOC~~ SOLN
35.0000 [IU] | Freq: Every day | SUBCUTANEOUS | Status: DC
  Administered 2022-09-13 – 2022-09-14 (×2): 35 [IU] via SUBCUTANEOUS
  Filled 2022-09-13 (×2): qty 0.35

## 2022-09-13 NOTE — Progress Notes (Signed)
  Echocardiogram 2D Echocardiogram has been performed.  [name] 09/13/2022, 3:29 PM

## 2022-09-13 NOTE — Progress Notes (Signed)
Left hand splint, delivered to room and applied

## 2022-09-13 NOTE — Evaluation (Signed)
Occupational Therapy Evaluation Patient Details Name: [name] MRN: [id number] DOB: 1966/01/29 Today's Date: 09/13/2022   History of Present Illness Pt is a 56 y/o female who presented with chest pain. Chest angio concerning for emphysematous cystitis. Also noted with AKI, vulvovaginal candidiasis. PMH: HLD, CVA affecting L side, ICD, HLD, CHF, a fib, Dm2.   Clinical Impression   PTA, pt lives with a friend, typically ambulatory with a quad cane and has daily aide assistance for bathing/dressing tasks. Pt presents now with deficits in strength, dynamic standing balance and baseline L hemiparesis. Overall, pt requires Mod A for bed mobility, Min A x 2 for transfers w/ handheld assist. Pt requires Mod A for UB ADLs and Mod-Max A for LB ADLs. Pt endorses self ROM to L UE consistently with some increased tone noted in L hand. After confirmation from MD, placed order for L resting hand splint to prevent contractures. Anticipate good progress with mobility during admission to return home with [place].    *Please allow OT to assess resting hand splint fit and to establish wearing schedule.     Recommendations for follow up therapy are one component of a multi-disciplinary discharge planning process, led by the attending physician.  Recommendations may be updated based on patient status, additional functional criteria and insurance authorization.   Follow Up Recommendations  Home health OT    Assistance Recommended at Discharge Intermittent Supervision/Assistance  Patient can return home with the following A little help with walking and/or transfers;A little help with bathing/dressing/bathroom    Functional Status Assessment  Patient has had a recent decline in their functional status and demonstrates the ability to make significant improvements in function in a reasonable and predictable amount of time.  Equipment Recommendations  Other (comment) (new quad cane)    Recommendations for Other  Services       Precautions / Restrictions Precautions Precautions: Fall Required Braces or Orthoses: Other Brace Other Brace: Has afo for LLE, with confirmation from MD - OT placed order for resting hand splint on 10/2 Restrictions Weight Bearing Restrictions: No      Mobility Bed Mobility Overal bed mobility: Needs Assistance Bed Mobility: Supine to Sit     Supine to sit: Mod assist, HOB elevated     General bed mobility comments: assist to get LLE off of bed and increased time to lift trunk and scoot EOB    Transfers Overall transfer level: Needs assistance Equipment used: 2 person hand held assist Transfers: Sit to/from Stand, Bed to chair/wheelchair/BSC Sit to Stand: Min assist, +2 safety/equipment, +2 physical assistance     Step pivot transfers: Min assist, +2 physical assistance, +2 safety/equipment     General transfer comment: Min A x 2 with increased effort to stand. slow pacing to step to recliner w/o AFO brace on      Balance Overall balance assessment: Needs assistance Sitting-balance support: No upper extremity supported, Feet supported Sitting balance-Leahy Scale: Good     Standing balance support: Single extremity supported, During functional activity Standing balance-Leahy Scale: Poor                             ADL either performed or assessed with clinical judgement   ADL Overall ADL's : Needs assistance/impaired Eating/Feeding: Set up Eating/Feeding Details (indicate cue type and reason): assist to open containers Grooming: Minimal assistance;Standing   Upper Body Bathing: Sitting;Moderate assistance   Lower Body Bathing: Moderate assistance;Sit to/from stand  Upper Body Dressing : Moderate assistance;Sitting   Lower Body Dressing: Maximal assistance;Sit to/from stand   Toilet Transfer: Minimal assistance;+2 for safety/equipment;Stand-pivot   Toileting- Clothing Manipulation and Hygiene: Moderate assistance;Sit to/from  stand         General ADL Comments: Limited by progressive weakness along with baseline residual deficits from prior CVA.     Vision Baseline Vision/History: 1 Wears glasses Ability to See in Adequate Light: 1 Impaired Patient Visual Report: No change from baseline Vision Assessment?: No apparent visual deficits     Perception     Praxis      Pertinent Vitals/Pain Pain Assessment Pain Assessment: Faces Faces Pain Scale: Hurts little more Pain Location: bottom Pain Descriptors / Indicators: Sore Pain Intervention(s): Monitored during session, Repositioned, Other (comment) (geomat to chair, encouraged pressure relieving strategies)     Hand Dominance Right   Extremity/Trunk Assessment Upper Extremity Assessment Upper Extremity Assessment: LUE deficits/detail LUE Deficits / Details: hx of CVA affecting L side. Pt reports when working with OT in past, she had movement in this UE though now has no movement. Reports stretching this UE consistently at home. some increased tone in digits though able to stretch [facility] passively. coordinated with MD/pt for placement of order for resting hand splint LUE Coordination: decreased fine motor;decreased gross motor   Lower Extremity Assessment Lower Extremity Assessment: Defer to PT evaluation   Cervical / Trunk Assessment Cervical / Trunk Assessment: Normal   Communication Communication Communication: No difficulties   Cognition Arousal/Alertness: Awake/alert Behavior During Therapy: WFL for tasks assessed/performed Overall Cognitive Status: Within Functional Limits for tasks assessed                                       General Comments  MD team entering during session    Exercises     Shoulder Instructions      Home Living Family/patient expects to be discharged to:: Private residence Living Arrangements: Non-relatives/Friends Available Help at Discharge: Friend(s);Personal care attendant Type of Home:  House Home Access: Stairs to enter [employer] of Steps: 2 Entrance Stairs-Rails: Left;Right Home Layout: One level     Bathroom Shower/Tub: Chief Strategy Officer: Standard     Home Equipment: Shower seat;BSC/3in1;Toilet riser;Cane - quad   Additional Comments: Aide comes 7 days/week for 6 hours a day      Prior Functioning/Environment Prior Level of Function : Needs assist             Mobility Comments: Uses cane for ambulation ADLs Comments: Aide assists with showering, dressing. Pt uses BSC and walks to bathroom. previously had a resting hand splint for L UE though reports previous OT supposed to make her another one prior to therapy being cut        OT Problem List: Decreased strength;Decreased activity tolerance;Impaired balance (sitting and/or standing);Impaired tone;Impaired UE functional use      OT Treatment/Interventions: Self-care/ADL training;Therapeutic exercise;Energy conservation;DME and/or AE instruction;Manual therapy;Therapeutic activities;Splinting    OT Goals(Current goals can be found in the care plan section) Acute Rehab OT Goals Patient Stated Goal: work with therapy more, maximize independence OT Goal Formulation: With patient Time For Goal Achievement: 09/27/22 Potential to Achieve Goals: Good  OT Frequency: Min 2X/week    Co-evaluation PT/OT/SLP Co-Evaluation/Treatment: Yes Reason for Co-Treatment: For patient/therapist safety;To address functional/ADL transfers   OT goals addressed during session: ADL's and self-care;Strengthening/ROM  AM-PAC OT "6 Clicks" Daily Activity     Outcome Measure Help from another person eating meals?: A Little Help from another person taking care of personal grooming?: A Little Help from another person toileting, which includes using toliet, bedpan, or urinal?: A Lot Help from another person bathing (including washing, rinsing, drying)?: A Lot Help from another person to put on  and taking off regular upper body clothing?: A Lot Help from another person to put on and taking off regular lower body clothing?: A Lot 6 Click Score: 14   End of Session    Activity Tolerance: Patient tolerated treatment well Patient left: in chair;with call bell/phone within reach;Other (comment) (chair alarm box in room though no batteries present)  OT Visit Diagnosis: Other abnormalities of gait and mobility (R26.89)                Time: 9833-8250 OT Time Calculation (min): 23 min Charges:  OT General Charges $OT Visit: 1 Visit OT Evaluation $OT Eval Low Complexity: 1 Low  Bradd Canary, OTR/L Acute Rehab Services Office: [phone]   Lorre Munroe 09/13/2022, 12:47 PM

## 2022-09-13 NOTE — Inpatient Diabetes Management (Signed)
Inpatient Diabetes Program Recommendations  AACE/ADA: New Consensus Statement on Inpatient Glycemic Control (2015)  Target Ranges:  Prepandial:   less than 140 mg/dL      Peak postprandial:   less than 180 mg/dL (1-2 hours)      Critically ill patients:  140 - 180 mg/dL   Lab Results  Component Value Date   GLUCAP 250 (H) 09/13/2022   HGBA1C 13.3 (H) 09/13/2022    Review of Glycemic Control  Latest Reference Range & Units 09/12/22 07:34 09/12/22 12:19 09/12/22 16:51 09/12/22 23:22 09/13/22 07:15 09/13/22 11:39  Glucose-Capillary 70 - 99 mg/dL 370 (H) 289 (H) 304 (H) 199 (H) 200 (H) 250 (H)   Diabetes history: DM 2 Outpatient Diabetes medications: Trulicity weekly, Basaglar 30 units Current orders for Inpatient glycemic control:  Semglee 35 units Daily (increased today from 30 units) Novolog 0-15 units tid + hs Novolog 5 units tid meal coverage (added today)  A1c 13.3% this admission  Spoke with pt at bedside regarding A1c level of 13.3% and glucose control at home. Pt reports 2 months ago her A1c was around a 9% per her doctor. Pt says she has not changed anything with her eating habits or have been on any recent steroid medications to cause a spike her her glucose levels. Pt checks her glucose 1-2 times a day and have noticed an increase in glucose trends. Pt occasionally calls her PCP for insulin adjustments when glucose trends are not at goal, but have not called recently. Pt reports her aid was not able to help her get to her most recent appointment.   Pt reports needing a new glucose meter at time of discharge.  Needs at d/c: Glucose meter kit order # [id number]   Thanks,  Tama Headings RN, MSN, BC-ADM Inpatient Diabetes Coordinator Team Pager [phone] (8a-5p)

## 2022-09-13 NOTE — Progress Notes (Signed)
No ICM remote transmission received for 09/13/2022 and next ICM transmission scheduled for 10/04/2022.

## 2022-09-13 NOTE — Progress Notes (Addendum)
Subjective:   Summary: [name] is a 56 y.o. year old female currently admitted on the IMTS HD#0 for emphysematous cholecystitis.  Overnight Events: No overnight events  Patient was seen this morning in the a.m. bedside.  Physical therapy was also in the room.  Patient states she is feeling better, and has understanding of her condition and its going on.  She is urinating well, and is feeling better overall.  Objective:  Vital signs in last 24 hours: Vitals:   09/13/22 0700 09/13/22 0716 09/13/22 0835 09/13/22 1225  BP: 117/68  103/64 103/68  Pulse: 92  93 88  Resp:   20 18  Temp:  98 F (36.7 C)  98.4 F (36.9 C)  TempSrc:  Oral  Oral  SpO2: 100%  92% 100%  Weight:      Height:       Supplemental O2: Room Air SpO2: 100 % O2 Flow Rate (L/min): 2 L/min   Physical Exam:  Constitutional: Middle-aged female, laying in bed, in no acute distress Cardiovascular: Ventricularly paced rhythm, no murmurs rubs or gallops heard Pulmonary/Chest: normal work of breathing on room air, lungs clear to auscultation bilaterally Abdominal: soft, non-tender, non-distended Skin: warm and dry Extremities: upper/lower extremity pulses 2+, no lower extremity edema present Genitourinary: Foley catheter in place, swelling on lateral aspects of external vagina, lotion applied to inguinal areas bilaterally Neurologic: Left hemiparesis, chronic  Filed Weights   09/12/22 0353  Weight: 65.8 kg     Intake/Output Summary (Last 24 hours) at 09/13/2022 1406 Last data filed at 09/13/2022 0237 Gross per 24 hour  Intake 96.43 ml  Output 1850 ml  Net -1753.57 ml   Net IO Since Admission: -1,660.98 mL [09/13/22 1406]  Pertinent Labs:    Latest Ref Rng & Units 09/13/2022    5:30 AM 09/12/2022    4:15 AM 04/27/2022   11:30 AM  CBC  WBC 4.0 - 10.5 K/uL 11.4  10.8  13.0   Hemoglobin 12.0 - 15.0 g/dL 9.1  10.6  26.9   Hematocrit 36.0 - 46.0 % 29.4  34.4  42.3   Platelets 150  - 400 K/uL 212  226  297        Latest Ref Rng & Units 09/13/2022    5:30 AM 09/12/2022    4:15 AM 04/27/2022   11:30 AM  CMP  Glucose 70 - 99 mg/dL 485  462  703   BUN 6 - 20 mg/dL 12  15  13    Creatinine 0.44 - 1.00 mg/dL  5.00  9.38   Sodium 135 - 145 mmol/L 136  132  130   Potassium 3.5 - 5.1 mmol/L 4.0  3.6  4.9   Chloride 98 - 111 mmol/L 101  98  90   CO2 22 - 32 mmol/L 28  23  22    Calcium 8.9 - 10.3 mg/dL 8.4  8.6  9.7   Total Protein 6.5 - 8.1 g/dL  7.9    Total Bilirubin 0.3 - 1.2 mg/dL  0.5    Alkaline Phos 38 - 126 U/L  126    AST 15 - 41 U/L  15    ALT 0 - 44 U/L  12        Assessment/Plan:   Principal Problem:   Emphysematous cystitis Active Problems:   OSA (obstructive sleep apnea)   Hyperlipidemia   Depression  Chronic combined systolic and diastolic heart failure (HCC)   Mixed stress and urge urinary incontinence   ICD (implantable cardioverter-defibrillator) in place   Hemiparesis affecting left side as late effect of cerebrovascular accident ([place])   Morbid obesity ([place])   Poorly controlled type 2 diabetes mellitus with neuropathy ([place])   Atrial fibrillation ([place])   Patient Summary: [name] is a 56 y.o. 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   On exam today, she did not endorse any burning with urination, and has been able to ambulate with physical therapy and Occupational Therapy.  Foley is currently in place, and patient has had an output net of -1661 up to this point.  Urology was called for advice, and recommended Foley placement and otherwise outpatient management of this.  Patient's creatinine was 1.6 on admission, however this morning.  A.m. labs creatinine is 1.28.  Slight improvement, we will continue to trend.  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  At home, she is on Trulicity 3 mg weekly and Basaglar 30 units daily.  Most recent CBG showed fasting glucose of 250 at 11, at this point after 35 units of glargine readings have not been taken.  We will continue to monitor  Plan: - Increase Semglee from 30 units to 35 units - Continue sliding scale - Trend CBGs - Home gabapentin for neuropathy   #Anginal chest pain #Chronic combined systolic and diastolic heart failure #NICM status post ICD placement Atrial fibrillation, on Xarelto Hypertension Patient is no longer having any chest pain, seems like it was relieved after nitro administration with EMS.  Troponins were flat, and EKG with no acute acute ischemic changes.  Last echo was done in August 2022 which showed a ejection fraction of 25% global hypokinesis and grade 2 diastolic dysfunction.  Euvolemic on exam.  - Continue Toprol-XL 100 mg daily - Continue Xarelto - Continue hydralazine 25 mg 3 times daily and Imdur 60 mg daily - Holding Norvasc, can continue with patient is hypertensive - Holding spironolactone because of current AKI  Code: Full    Dispo: Anticipated discharge in less than 2 midnights  Drucie Opitz, MD PGY-1 Internal Medicine Resident Pager Number  [phone] Please contact the on call pager after 5 pm and on weekends at [phone].

## 2022-09-13 NOTE — Progress Notes (Signed)
Orthopedic Tech Progress Note Patient Details:  [name] 11-08-1966 [id number]  Called in order to [facility] for a RESTING HAND SPLINT   Patient ID: [name], female   DOB: 1966-01-30, 56 y.o.   MRN: [id number]  [name] 09/13/2022, 11:54 AM

## 2022-09-13 NOTE — Evaluation (Signed)
Physical Therapy Evaluation Patient Details Name: [name] MRN: [id number] DOB: Oct 03, 1966 Today's Date: 09/13/2022  History of Present Illness  Pt is a 56 y/o female who presented with chest pain. Chest angio concerning for emphysematous cystitis. Also noted with AKI, vulvovaginal candidiasis. PMH: HLD, CVA affecting L side, ICD, HLD, CHF, a fib, Dm2.  Clinical Impression  Pt admitted secondary to problem above with deficits below. Pt requiring mod A for bed mobility and min A to stand and transfer to chair this session. Pt very motivated to participate as she wants to return home. Anticipate pt will progress well and be able to d/c with HHPT, however, will need to ensure safety with mobility progression prior to d/c. Reports she has an aide that assists her 7 days/week for 6 hours. Will continue to follow acutely.        Recommendations for follow up therapy are one component of a multi-disciplinary discharge planning process, led by the attending physician.  Recommendations may be updated based on patient status, additional functional criteria and insurance authorization.  Follow Up Recommendations Home health PT (pending progression)      Assistance Recommended at Discharge Frequent or constant Supervision/Assistance  Patient can return home with the following  A lot of help with bathing/dressing/bathroom;A little help with walking and/or transfers    Equipment Recommendations Other (comment) (quad cane)  Recommendations for Other Services       Functional Status Assessment Patient has had a recent decline in their functional status and demonstrates the ability to make significant improvements in function in a reasonable and predictable amount of time.     Precautions / Restrictions Precautions Precautions: Fall Required Braces or Orthoses: Other Brace Other Brace: Has afo for LLE, with confirmation from MD - OT placed order for resting hand splint on  10/2 Restrictions Weight Bearing Restrictions: No      Mobility  Bed Mobility Overal bed mobility: Needs Assistance Bed Mobility: Supine to Sit     Supine to sit: Mod assist, HOB elevated     General bed mobility comments: assist to get LLE off of bed and increased time to lift trunk and scoot EOB    Transfers Overall transfer level: Needs assistance Equipment used: 2 person hand held assist Transfers: Sit to/from Stand, Bed to chair/wheelchair/BSC Sit to Stand: Min assist, +2 safety/equipment, +2 physical assistance   Step pivot transfers: Min assist, +2 physical assistance, +2 safety/equipment       General transfer comment: Min A +2 with increased effort to stand. slow pacing to step to recliner w/o AFO brace on    Ambulation/Gait                  Stairs            Wheelchair Mobility    Modified Rankin (Stroke Patients Only)       Balance Overall balance assessment: Needs assistance Sitting-balance support: No upper extremity supported, Feet supported Sitting balance-Leahy Scale: Good     Standing balance support: Single extremity supported, During functional activity Standing balance-Leahy Scale: Poor                               Pertinent Vitals/Pain Pain Assessment Pain Assessment: Faces Faces Pain Scale: Hurts little more Pain Location: bottom Pain Descriptors / Indicators: Sore Pain Intervention(s): Limited activity within patient's tolerance, Monitored during session    Home Living Family/patient expects to be discharged to::  Private residence Living Arrangements: Non-relatives/Friends Available Help at Discharge: Friend(s);Personal care attendant Type of Home: House Home Access: Stairs to enter Entrance Stairs-Rails: [employer] of Steps: 2   Home Layout: One level Home Equipment: Shower seat;BSC/3in1;Toilet riser;Cane - quad Additional Comments: Aide comes 7 days/week for 6 hours a day     Prior Function Prior Level of Function : Needs assist             Mobility Comments: Uses cane for ambulation ADLs Comments: Aide assists with showering, dressing. Pt uses BSC and walks to bathroom. previously had a resting hand splint for L UE though reports previous OT supposed to make her another one prior to therapy being cut     Hand Dominance   Dominant Hand: Right    Extremity/Trunk Assessment   Upper Extremity Assessment Upper Extremity Assessment: Defer to OT evaluation LUE Deficits / Details: hx of CVA affecting L side. Pt reports when working with OT in past, she had movement in this UE though now has no movement. Reports stretching this UE consistently at home. some increased tone in digits though able to stretch [facility] passively. coordinated with MD/pt for placement of order for resting hand splint LUE Coordination: decreased fine motor;decreased gross motor    Lower Extremity Assessment Lower Extremity Assessment: LLE deficits/detail LLE Deficits / Details: LLE weakness at baseline. Pt with L foot drop and wears AFO    Cervical / Trunk Assessment Cervical / Trunk Assessment: Normal  Communication   Communication: No difficulties  Cognition Arousal/Alertness: Awake/alert Behavior During Therapy: WFL for tasks assessed/performed Overall Cognitive Status: Within Functional Limits for tasks assessed                                          General Comments General comments (skin integrity, edema, etc.): MD team entering during session    Exercises     Assessment/Plan    PT Assessment Patient needs continued PT services  PT Problem List Decreased strength;Decreased balance;Decreased activity tolerance;Decreased mobility       PT Treatment Interventions DME instruction;Gait training;Therapeutic activities;Functional mobility training;Therapeutic exercise;Balance training;Stair training;Patient/family education    PT Goals (Current goals  can be found in the Care Plan section)  Acute Rehab PT Goals Patient Stated Goal: to go home PT Goal Formulation: With patient Time For Goal Achievement: 09/27/22 Potential to Achieve Goals: Good    Frequency Min 3X/week     Co-evaluation PT/OT/SLP Co-Evaluation/Treatment: Yes Reason for Co-Treatment: For patient/therapist safety;To address functional/ADL transfers PT goals addressed during session: Mobility/safety with mobility;Balance OT goals addressed during session: ADL's and self-care;Strengthening/ROM       AM-PAC PT "6 Clicks" Mobility  Outcome Measure Help needed turning from your back to your side while in a flat bed without using bedrails?: A Little Help needed moving from lying on your back to sitting on the side of a flat bed without using bedrails?: A Lot Help needed moving to and from a bed to a chair (including a wheelchair)?: A Little Help needed standing up from a chair using your arms (e.g., wheelchair or bedside chair)?: A Lot Help needed to walk in hospital room?: A Little Help needed climbing 3-5 steps with a railing? : A Lot 6 Click Score: 15    End of Session Equipment Utilized During Treatment: Gait belt Activity Tolerance: Patient tolerated treatment well Patient left: in chair;with call bell/phone  within reach;with chair alarm set Nurse Communication: Mobility status PT Visit Diagnosis: Unsteadiness on feet (R26.81);Muscle weakness (generalized) (M62.81);Difficulty in walking, not elsewhere classified (R26.2)    Time: 6269-4854 PT Time Calculation (min) (ACUTE ONLY): 23 min   Charges:   PT Evaluation $PT Eval Moderate Complexity: 1 Mod          Farley Ly, PT, DPT  Acute Rehabilitation Services  Office: [phone]   Lehman Prom 09/13/2022, 3:18 PM

## 2022-09-14 ENCOUNTER — Inpatient Hospital Stay (HOSPITAL_COMMUNITY): Payer: Medicaid Other

## 2022-09-14 DIAGNOSIS — N1330 Unspecified hydronephrosis: Secondary | ICD-10-CM | POA: Diagnosis not present

## 2022-09-14 DIAGNOSIS — N3080 Other cystitis without hematuria: Secondary | ICD-10-CM | POA: Diagnosis not present

## 2022-09-14 DIAGNOSIS — N179 Acute kidney failure, unspecified: Secondary | ICD-10-CM | POA: Diagnosis not present

## 2022-09-14 LAB — BASIC METABOLIC PANEL
Anion gap: 8 (ref 5–15)
BUN: 12 mg/dL (ref 6–20)
CO2: 26 mmol/L (ref 22–32)
Calcium: 8.3 mg/dL — ABNORMAL LOW (ref 8.9–10.3)
Chloride: 100 mmol/L (ref 98–111)
Creatinine, Ser: 1.25 mg/dL — ABNORMAL HIGH (ref 0.44–1.00)
GFR, Estimated: 51 mL/min — ABNORMAL LOW (ref >60)
Glucose, Bld: 169 mg/dL — ABNORMAL HIGH (ref 70–99)
Potassium: 3.5 mmol/L (ref 3.5–5.1)
Sodium: 134 mmol/L — ABNORMAL LOW (ref 135–145)

## 2022-09-14 LAB — CBC
HCT: 29 % — ABNORMAL LOW (ref 36.0–46.0)
Hemoglobin: 9.1 g/dL — ABNORMAL LOW (ref 12.0–15.0)
MCH: 27.2 pg (ref 26.0–34.0)
MCHC: 31.4 g/dL (ref 30.0–36.0)
MCV: 86.8 fL (ref 80.0–100.0)
Platelets: 218 10*3/uL (ref 150–400)
RBC: 3.34 MIL/uL — ABNORMAL LOW (ref 3.87–5.11)
RDW: 13.6 % (ref 11.5–15.5)
WBC: 11.1 10*3/uL — ABNORMAL HIGH (ref 4.0–10.5)
nRBC: 0 % (ref 0.0–0.2)

## 2022-09-14 LAB — URINE CULTURE: Culture: 80000 — AB

## 2022-09-14 LAB — GLUCOSE, CAPILLARY
Glucose-Capillary: 152 mg/dL — ABNORMAL HIGH (ref 70–99)
Glucose-Capillary: 134 mg/dL — ABNORMAL HIGH (ref 70–99)
Glucose-Capillary: 93 mg/dL (ref 70–99)
Glucose-Capillary: 192 mg/dL — ABNORMAL HIGH (ref 70–99)

## 2022-09-14 LAB — IRON AND TIBC
Iron: 36 ug/dL (ref 28–170)
Saturation Ratios: 17 % (ref 10.4–31.8)
TIBC: 218 ug/dL — ABNORMAL LOW (ref 250–450)
UIBC: 182 ug/dL

## 2022-09-14 LAB — FERRITIN: Ferritin: 208 ng/mL (ref 11–307)

## 2022-09-14 MED ORDER — LOPERAMIDE HCL 2 MG PO CAPS
2.0000 mg | ORAL_CAPSULE | ORAL | Status: DC | PRN
  Administered 2022-09-14: 2 mg via ORAL
  Filled 2022-09-14: qty 1

## 2022-09-14 MED ORDER — OXYBUTYNIN CHLORIDE 5 MG PO TABS
5.0000 mg | ORAL_TABLET | Freq: Once | ORAL | Status: AC
  Administered 2022-09-14: 5 mg via ORAL
  Filled 2022-09-14: qty 1

## 2022-09-14 MED ORDER — INSULIN GLARGINE-YFGN 100 UNIT/ML ~~LOC~~ SOLN
40.0000 [IU] | Freq: Every day | SUBCUTANEOUS | Status: DC
  Filled 2022-09-14: qty 0.4

## 2022-09-14 NOTE — Progress Notes (Signed)
Physical Therapy Treatment Patient Details Name: [name] MRN: [id number] DOB: 02/24/1966 Today's Date: 09/14/2022   History of Present Illness Pt is a 56 y/o female who presented with chest pain. Chest angio concerning for emphysematous cystitis. Also noted with AKI, vulvovaginal candidiasis. PMH: HLD, CVA affecting L side, ICD, HLD, CHF, a fib, Dm2.    PT Comments    Pt was available for therapy this PM, and noted that her efforts were good with LLE, noting AAROM on joints.  Pt is weaker on LUE but is motivated to try to work.  Put L side into WB postures on bed, mainly to avoid heel pressure and encourage more LUE movement.  Pt is in agreement with DC plan to work toward home with her caregivers and family to assist as prior to this admission.  Encourage OOB to chair and more standing as pt tolerates.  Recommendations for follow up therapy are one component of a multi-disciplinary discharge planning process, led by the attending physician.  Recommendations may be updated based on patient status, additional functional criteria and insurance authorization.  Follow Up Recommendations  Home health PT     Assistance Recommended at Discharge Frequent or constant Supervision/Assistance  Patient can return home with the following A lot of help with bathing/dressing/bathroom;A little help with walking and/or transfers   Equipment Recommendations  Other (comment)    Recommendations for Other Services       Precautions / Restrictions Precautions Precautions: Fall Required Braces or Orthoses: Other Brace Other Brace: LLE AFO and LUE hand splint Restrictions Weight Bearing Restrictions: No     Mobility  Bed Mobility Overal bed mobility: Needs Assistance             General bed mobility comments: declined OOB    Transfers                   General transfer comment: declined OOB    Ambulation/Gait                   Stairs              Wheelchair Mobility    Modified Rankin (Stroke Patients Only)       Balance                                            Cognition Arousal/Alertness: Awake/alert Behavior During Therapy: WFL for tasks assessed/performed Overall Cognitive Status: Within Functional Limits for tasks assessed                                 General Comments: attended by supportive family        Exercises General Exercises - Upper Extremity Shoulder Flexion: PROM, Left Elbow Flexion: PROM, Left Elbow Extension: PROM, Left Wrist Flexion: PROM, Left Wrist Extension: PROM, Left General Exercises - Lower Extremity Ankle Circles/Pumps: PROM, AAROM, Both Quad Sets: AROM, Both Gluteal Sets: AROM, Both Heel Slides: AROM, AAROM, PROM, Both, 10 reps Hip ABduction/ADduction: AAROM, PROM, 10 reps Straight Leg Raises: PROM, AROM, AAROM, Both, 10 reps Hip Flexion/Marching: PROM, AAROM, AROM, 10 reps, Both    General Comments General comments (skin integrity, edema, etc.): pt is in bed with help afforded to do BLE ROM and to reposition L side for faciltiation of mm tone and actuive carryover  Pertinent Vitals/Pain Pain Assessment Pain Assessment: No/denies pain    Home Living                          Prior Function            PT Goals (current goals can now be found in the care plan section) Acute Rehab PT Goals Patient Stated Goal: to go home    Frequency    Min 3X/week      PT Plan Current plan remains appropriate    Co-evaluation              AM-PAC PT "6 Clicks" Mobility   Outcome Measure  Help needed turning from your back to your side while in a flat bed without using bedrails?: A Little Help needed moving from lying on your back to sitting on the side of a flat bed without using bedrails?: A Lot Help needed moving to and from a bed to a chair (including a wheelchair)?: A Little Help needed standing up from a chair  using your arms (e.g., wheelchair or bedside chair)?: A Lot Help needed to walk in hospital room?: A Little Help needed climbing 3-5 steps with a railing? : Total 6 Click Score: 14    End of Session   Activity Tolerance: Patient tolerated treatment well Patient left: in bed;with call bell/phone within reach;with bed alarm set;with family/visitor present Nurse Communication: Mobility status PT Visit Diagnosis: Unsteadiness on feet (R26.81);Muscle weakness (generalized) (M62.81);Difficulty in walking, not elsewhere classified (R26.2)     Time: 6333-5456 PT Time Calculation (min) (ACUTE ONLY): 21 min  Charges:  $Therapeutic Exercise: 8-22 mins     Ramond Dial 09/14/2022, 5:07 PM  Mee Hives, PT PhD Acute Rehab Dept. Number: [place] and [place]

## 2022-09-14 NOTE — Progress Notes (Signed)
PT Cancellation Note  Patient Details Name: [name] MRN: [id number] DOB: 1966/06/01   Cancelled Treatment:    Reason Eval/Treat Not Completed: Patient at procedure or test/unavailable.  Reattempt as time and pt allow.   Ramond Dial 09/14/2022, 10:22 AM  Mee Hives, PT PhD Acute Rehab Dept. Number: [place] and [place]

## 2022-09-14 NOTE — Progress Notes (Addendum)
OT Cancellation Note  Patient Details Name: [name] MRN: [id number] DOB: [birth year]   Cancelled Treatment:    Reason Eval/Treat Not Completed: Patient at procedure or test/ unavailable Transport taking pt to ultrasound. Will follow up as schedule permits.   Re-attempted 1 hour later with pt sleeping on entry, did not awaken to voice. Will follow up tomorrow to assess resting hand splint fit/wearing time.  Layla Maw 09/14/2022, 10:17 AM

## 2022-09-14 NOTE — Progress Notes (Signed)
Subjective:   Summary: [name] is a 56 y.o. year old female currently admitted on the IMTS HD#1 for emphysematous cholecystitis.  Overnight Events: NOE  Patient was seen this a.m. bedside.  She was complaining of spasms in her bladder, and upper abdominal pain that was spread out.  She states that the pain started around 3 AM.  She stated the pain was due to the catheter placement.   Vital signs in last 24 hours: Vitals:   09/13/22 1945 09/14/22 0046 09/14/22 0701 09/14/22 0802  BP: (!) 98/58  (!) 145/81 (!) 159/106  Pulse: 81  89 95  Resp: 17 18 17 18   Temp: 98.3 F (36.8 C)  98.6 F (37 C) 98.3 F (36.8 C)  TempSrc:   Oral Oral  SpO2: 95%  95% 97%  Weight:      Height:       Supplemental O2: Room Air SpO2: 97 % O2 Flow Rate (L/min): 2 L/min   Physical Exam:  Constitutional: Middle-aged female, lying in bed, in moderate amount of pain from catheter Cardiovascular: Ventricularly paced rhythm, no murmurs rubs or gallops heard Pulmonary/Chest: normal work of breathing on room air, lungs clear to auscultation bilaterally Abdominal: soft, non-tender, non-distended Skin: warm and dry Extremities: upper/lower extremity pulses 2+, no lower extremity edema present Genitourinary: Foley catheter in place Neurologic: Left hemiparesis, chronic  Filed Weights   09/12/22 0353  Weight: 65.8 kg     Intake/Output Summary (Last 24 hours) at 09/14/2022 1050 Last data filed at 09/14/2022 0805 Gross per 24 hour  Intake --  Output 950 ml  Net -950 ml   Net IO Since Admission: -2,610.98 mL [09/14/22 1050]  Pertinent Labs:    Latest Ref Rng & Units 09/14/2022    5:20 AM 09/13/2022    5:30 AM 09/12/2022    4:15 AM  CBC  WBC 4.0 - 10.5 K/uL 11.1  11.4  10.8   Hemoglobin 12.0 - 15.0 g/dL 9.1  9.1  11/12/2022   Hematocrit 36.0 - 46.0 % 29.0  29.4  34.4   Platelets 150 - 400 K/uL 218  212  226        Latest Ref Rng & Units 09/14/2022    5:20 AM 09/13/2022     5:30 AM 09/12/2022    4:15 AM  CMP  Glucose 70 - 99 mg/dL 11/12/2022  945  038   BUN 6 - 20 mg/dL 12  12  15    Creatinine 0.44 - 1.00 mg/dL 882   8.00   Sodium 135 - 145 mmol/L 134  136  132   Potassium 3.5 - 5.1 mmol/L 3.5  4.0  3.6   Chloride 98 - 111 mmol/L 100  101  98   CO2 22 - 32 mmol/L 26  28  23    Calcium 8.9 - 10.3 mg/dL 8.3  8.4  8.6   Total Protein 6.5 - 8.1 g/dL   7.9   Total Bilirubin 0.3 - 1.2 mg/dL   0.5   Alkaline Phos 38 - 126 U/L   126   AST 15 - 41 U/L   15   ALT 0 - 44 U/L   12      Imaging: ECHOCARDIOGRAM COMPLETE  Result Date: 09/13/2022    ECHOCARDIOGRAM REPORT   Patient Name:   [name] Date of Exam: 09/13/2022 Medical Rec #:  11/13/2022  Height:       69.0 in Accession #:    [id number]       Weight:       145.0 lb Date of Birth:  [birth year]        BSA:          1.802 m Patient Age:    56 years         BP:           103/68 mmHg Patient Gender: F                HR:           87 bpm. Exam Location:  Inpatient Procedure: 2D Echo Indications:    chest pain  History:        Patient has prior history of Echocardiogram examinations, most                 recent 07/17/2021. Cardiomyopathy, Arrythmias:Atrial Fibrillation;                 Risk Factors:Diabetes and Dyslipidemia.  Sonographer:    Delcie Roch RDCS Referring Phys: [id number] CAROLYN GUILLOUD IMPRESSIONS  1. Left ventricular ejection fraction, by estimation, is 25 to 30%. The left ventricle has severely decreased function. The left ventricle demonstrates global hypokinesis. There is moderate left ventricular hypertrophy. Left ventricular diastolic parameters are consistent with Grade I diastolic dysfunction (impaired relaxation).  2. Right ventricular systolic function is mildly reduced. The right ventricular size is normal. Tricuspid regurgitation signal is inadequate for assessing PA pressure.  3. The mitral valve is normal in structure. No evidence of mitral valve regurgitation.  4. The aortic valve is  tricuspid. Aortic valve regurgitation is not visualized. No aortic stenosis is present.  5. The inferior vena cava is normal in size with greater than 50% respiratory variability, suggesting right atrial pressure of 3 mmHg. FINDINGS  Left Ventricle: Left ventricular ejection fraction, by estimation, is 25 to 30%. The left ventricle has severely decreased function. The left ventricle demonstrates global hypokinesis. The left ventricular internal cavity size was normal in size. There is moderate left ventricular hypertrophy. Left ventricular diastolic parameters are consistent with Grade I diastolic dysfunction (impaired relaxation). Right Ventricle: The right ventricular size is normal. No increase in right ventricular wall thickness. Right ventricular systolic function is mildly reduced. Tricuspid regurgitation signal is inadequate for assessing PA pressure. Left Atrium: Left atrial size was normal in size. Right Atrium: Right atrial size was normal in size. Pericardium: There is no evidence of pericardial effusion. Mitral Valve: The mitral valve is normal in structure. No evidence of mitral valve regurgitation. Tricuspid Valve: The tricuspid valve is normal in structure. Tricuspid valve regurgitation is trivial. Aortic Valve: The aortic valve is tricuspid. Aortic valve regurgitation is not visualized. No aortic stenosis is present. Pulmonic Valve: The pulmonic valve was not well visualized. Pulmonic valve regurgitation is not visualized. Aorta: The aortic root and ascending aorta are structurally normal, with no evidence of dilitation. Venous: The inferior vena cava is normal in size with greater than 50% respiratory variability, suggesting right atrial pressure of 3 mmHg. IAS/Shunts: The interatrial septum was not well visualized.  LEFT VENTRICLE PLAX 2D LVIDd:         3.90 cm     Diastology LVIDs:         3.40 cm     LV e' medial:    6.64 cm/s LV PW:         1.40 cm  LV E/e' medial:  11.8 LV IVS:        1.20  cm     LV e' lateral:   5.87 cm/s LVOT diam:     2.20 cm     LV E/e' lateral: 13.3 LV SV:         53 LV SV Index:   30 LVOT Area:     3.80 cm  LV Volumes (MOD) LV vol d, MOD A4C: 97.2 ml LV vol s, MOD A4C: 80.0 ml LV SV MOD A4C:     97.2 ml RIGHT VENTRICLE            IVC RV S prime:     9.14 cm/s  IVC diam: 1.80 cm TAPSE (M-mode): 1.0 cm LEFT ATRIUM           Index        RIGHT ATRIUM           Index LA diam:      3.40 cm 1.89 cm/m   RA Area:     10.10 cm LA Vol (A2C): 36.0 ml 19.98 ml/m  RA Volume:   20.50 ml  11.38 ml/m LA Vol (A4C): 45.2 ml 25.08 ml/m  AORTIC VALVE LVOT Vmax:   87.80 cm/s LVOT Vmean:  54.900 cm/s LVOT VTI:    0.140 m  AORTA Ao Root diam: 3.50 cm Ao Asc diam:  3.50 cm MITRAL VALVE MV Area (PHT): 5.27 cm     SHUNTS MV Decel Time: 144 msec     Systemic VTI:  0.14 m MV E velocity: 78.10 cm/s   Systemic Diam: 2.20 cm MV A velocity: 110.00 cm/s MV E/A ratio:  0.71 Oswaldo Milian MD Electronically signed by Oswaldo Milian MD Signature Date/Time: 09/13/2022/3:41:59 PM    Final      Assessment/Plan:   Principal Problem:   Emphysematous cystitis Active Problems:   Hemiparesis affecting left side as late effect of cerebrovascular accident ([place])   Morbid obesity ([place])   Poorly controlled type 2 diabetes mellitus with neuropathy ([place])   AKI (acute kidney injury) ([place])   Tinea cruris   Patient Summary: [name] is a 56 y.o. 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    #Emphysematous Cystitis Secondary to Bilateral Hydroureteronephrosis #AKI On admission patient was found to have incidental findings on CT angio showing bilateral hydroureteronephrosis to the level of thick-walled bladder with  luminal gas and perinephric stranding concerning for emphysematous cystitis.  On exam today, patient was in moderate amount of pain, she stated this was coming from her catheter.  Catheter placement may have caused a spasmodic bladder which could be the cause of her pain.  We will recommend doing a voiding trial, and see if patient is able to urinate on her own.  Current net output is -2611.  We will do a bladder scan after 4 hours post voiding trial, and redetermine needs for catheter placement.  Creatinine is slightly improved from 1.28 yesterday, to 1.25 today.  Plan: - Patient is on Zosyn day 2, will switch to oral antibiotics once urine cultures return -If urine cultures are negative for specific bacteria, can consider switching her to p.o. 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  At home she is on Trulicity 3 mg weekly and Basaglar 30 units daily.  Most recent CBG fasting glucose shows 152, random glucose was 169 at 5 AM and 7 AM respectively.  Patient is still not under good glucose control, so we will increase Semglee to 40 units and continue the sliding scale.  Plan: - Semglee 40 units daily - Sliding scale insulin - Gabapentin for neuropathy pain - Continue sliding scale  #Anginal chest pain #Chronic combined systolic and diastolic heart failure #NICM status post ICD placement #A-fib, on Xarelto #Hypertension Patient is no longer having any chest pain after administration of nitro by EMS.  Last echo was done in August 2022 which showed an EF of 25% global hypokinesis and grade 2 diastolic dysfunction.  Patient is currently euvolemic on exam Plan: - Continue Toprol-XL 100 mg daily - Continue Xarelto - Continue hydralazine 25 mg 3 times a day and Imdur 60 mg daily  Code:  Full    Dispo: Anticipated discharge in less than 2 midnights. Olegario Messier, MD PGY-1 Internal Medicine Resident Pager Number [phone] Please contact the on call pager after 5 pm and on weekends at [phone].

## 2022-09-14 NOTE — Progress Notes (Signed)
Pt c/o "pressure" coming from the bladder, bladder scan -185, foley output this shift 822ml, new anchoring device placed, will monitor closely

## 2022-09-15 ENCOUNTER — Other Ambulatory Visit (HOSPITAL_COMMUNITY): Payer: Self-pay

## 2022-09-15 DIAGNOSIS — N3080 Other cystitis without hematuria: Secondary | ICD-10-CM | POA: Diagnosis not present

## 2022-09-15 DIAGNOSIS — N1330 Unspecified hydronephrosis: Secondary | ICD-10-CM | POA: Diagnosis not present

## 2022-09-15 LAB — CBC
HCT: 33.4 % — ABNORMAL LOW (ref 36.0–46.0)
Hemoglobin: 10.8 g/dL — ABNORMAL LOW (ref 12.0–15.0)
MCH: 27.8 pg (ref 26.0–34.0)
MCHC: 32.3 g/dL (ref 30.0–36.0)
MCV: 85.9 fL (ref 80.0–100.0)
Platelets: 259 10*3/uL (ref 150–400)
RBC: 3.89 MIL/uL (ref 3.87–5.11)
RDW: 13.9 % (ref 11.5–15.5)
WBC: 17 10*3/uL — ABNORMAL HIGH (ref 4.0–10.5)
nRBC: 0 % (ref 0.0–0.2)

## 2022-09-15 LAB — ANTI-DNA ANTIBODY, DOUBLE-STRANDED: ds DNA Ab: 4 IU/mL (ref 0–9)

## 2022-09-15 LAB — GLUCOSE, CAPILLARY
Glucose-Capillary: 78 mg/dL (ref 70–99)
Glucose-Capillary: 105 mg/dL — ABNORMAL HIGH (ref 70–99)

## 2022-09-15 LAB — BASIC METABOLIC PANEL
Anion gap: 12 (ref 5–15)
BUN: 8 mg/dL (ref 6–20)
CO2: 26 mmol/L (ref 22–32)
Calcium: 8.7 mg/dL — ABNORMAL LOW (ref 8.9–10.3)
Chloride: 99 mmol/L (ref 98–111)
Creatinine, Ser: 1.07 mg/dL — ABNORMAL HIGH (ref 0.44–1.00)
GFR, Estimated: >60 mL/min (ref >60)
Glucose, Bld: 82 mg/dL (ref 70–99)
Potassium: 3.5 mmol/L (ref 3.5–5.1)
Sodium: 137 mmol/L (ref 135–145)

## 2022-09-15 MED ORDER — ACCU-CHEK SOFTCLIX LANCETS MISC
0 refills | Status: DC
  Filled 2022-09-15: qty 100, 30d supply, fill #0

## 2022-09-15 MED ORDER — CIPROFLOXACIN HCL 250 MG PO TABS
500.0000 mg | ORAL_TABLET | Freq: Every day | ORAL | 0 refills | Status: DC
  Filled 2022-09-15: qty 14, 7d supply, fill #0

## 2022-09-15 MED ORDER — INSULIN GLARGINE 100 UNIT/ML SOLOSTAR PEN
30.0000 [IU] | PEN_INJECTOR | Freq: Every day | SUBCUTANEOUS | 2 refills | Status: DC
  Filled 2022-09-15: qty 15, 50d supply, fill #0

## 2022-09-15 MED ORDER — LANTUS SOLOSTAR 100 UNIT/ML ~~LOC~~ SOPN
25.0000 [IU] | PEN_INJECTOR | Freq: Every day | SUBCUTANEOUS | 0 refills | Status: DC
  Filled 2022-09-15: qty 15, 60d supply, fill #0

## 2022-09-15 MED ORDER — ACCU-CHEK GUIDE W/DEVICE KIT
PACK | 0 refills | Status: DC
  Filled 2022-09-15: qty 1, 30d supply, fill #0

## 2022-09-15 MED ORDER — ZINC OXIDE 40 % EX OINT: TOPICAL_OINTMENT | CUTANEOUS | 0 refills | Status: DC

## 2022-09-15 MED ORDER — GLUCOSE BLOOD VI STRP
ORAL_STRIP | 0 refills | Status: DC
  Filled 2022-09-15: qty 100, 30d supply, fill #0

## 2022-09-15 MED ORDER — "PEN NEEDLES 5/16"" 31G X 8 MM MISC"
1.0000 | Freq: Four times a day (QID) | 1 refills | Status: DC
  Filled 2022-09-15: qty 100, 30d supply, fill #0

## 2022-09-15 MED ORDER — INSULIN GLARGINE-YFGN 100 UNIT/ML ~~LOC~~ SOLN: 25.0000 [IU] | Freq: Every day | SUBCUTANEOUS | Status: DC

## 2022-09-15 MED ORDER — FLUCONAZOLE 150 MG PO TABS
150.0000 mg | ORAL_TABLET | Freq: Once | ORAL | Status: AC
  Administered 2022-09-15: 150 mg via ORAL
  Filled 2022-09-15: qty 1

## 2022-09-15 MED ORDER — INSULIN GLARGINE-YFGN 100 UNIT/ML ~~LOC~~ SOLN
30.0000 [IU] | Freq: Every day | SUBCUTANEOUS | Status: DC
  Administered 2022-09-15: 25 [IU] via SUBCUTANEOUS
  Filled 2022-09-15: qty 0.3

## 2022-09-15 MED ORDER — CIPROFLOXACIN HCL 250 MG PO TABS
500.0000 mg | ORAL_TABLET | Freq: Every day | ORAL | 0 refills | Status: AC
  Filled 2022-09-15: qty 14, 7d supply, fill #0

## 2022-09-15 NOTE — Progress Notes (Signed)
RT attempted to place pt on CPAP mask to sleep, pt said she did not want to wear tonight. Pt knows to tell nurse to contact RT if she changes her mind.

## 2022-09-15 NOTE — Progress Notes (Signed)
Occupational Therapy Treatment Patient Details Name: [name] MRN: [id number] DOB: September 11, 1966 Today's Date: 09/15/2022   History of present illness Pt is a 56 y/o female who presented with chest pain. Chest angio concerning for emphysematous cystitis. Also noted with AKI, vulvovaginal candidiasis. PMH: HLD, CVA affecting L side, ICD, HLD, CHF, a fib, Dm2.   OT comments  Session focused on assessment of fit and wearing tolerance for L resting hand splint. Pt reports previously wearing splint at night w/o issue and able to tolerate wearing it some since delivery to room. Minor adjustments made to thumb area to maximize fit. Educated re: skin checks prior to and after splint wear, gradual increase of splint wearing time, and appropriate positioning of L UE to prevent shoulder subluxation. Pt inquiring about boot to wear in bed to address foot drop - placed order for L Prafo boot.  Checked back with pt 1.5 hours after splint donning with no concerns and pt opting to keep splint on for now. Educated to don within 4 hours today for skin check. Pt also with L prafo boot donned with this visit and denies any concerns with this.    Recommendations for follow up therapy are one component of a multi-disciplinary discharge planning process, led by the attending physician.  Recommendations may be updated based on patient status, additional functional criteria and insurance authorization.    Follow Up Recommendations  Home health OT    Assistance Recommended at Discharge Intermittent Supervision/Assistance  Patient can return home with the following  A little help with walking and/or transfers;A little help with bathing/dressing/bathroom   Equipment Recommendations  Other (comment) (new quad cane)    Recommendations for Other Services      Precautions / Restrictions Precautions Precautions: Fall Required Braces or Orthoses: Other Brace Other Brace: LLE AFO for ambulation, L resting hand  splint; placed order for L Prafo boot to wear in bed Restrictions Weight Bearing Restrictions: No       Mobility Bed Mobility                    Transfers                         Balance                                           ADL either performed or assessed with clinical judgement   ADL Overall ADL's : Needs assistance/impaired                                       General ADL Comments: Likely fairly close to baseline for ADLs - focus on resting hand splint fit/wear and addressing appropriate positioning of L UE at home to protect shoulder joint. Pt inquiring about boot to prevent progression of foot drop while in bed - placed order for Prafo boot. Pt declined OOB ADL participation at this time    Extremity/Trunk Assessment Upper Extremity Assessment Upper Extremity Assessment: LUE deficits/detail LUE Deficits / Details: hx of CVA affecting L side. Pt reports when working with OT in past, she had movement in this UE though now has no movement. Reports stretching this UE consistently at home. some increased tone in digits though able to stretch [facility]  passively. coordinated with MD/pt for placement of order for resting hand splint on 10/2. Able to assess fit on 10/4 with minor adjustment to thumb area to maximize fit. Educated re: skin assessment, gradual work towards full overnight wear. Pt reports previously wearing her splint at night for protection, joint alignment and prevent her fingers from curling LUE Coordination: decreased fine motor;decreased gross motor   Lower Extremity Assessment Lower Extremity Assessment: Defer to PT evaluation        Vision   Vision Assessment?: No apparent visual deficits   Perception     Praxis      Cognition Arousal/Alertness: Awake/alert Behavior During Therapy: WFL for tasks assessed/performed Overall Cognitive Status: Within Functional Limits for tasks assessed                                           Exercises      Shoulder Instructions       General Comments      Pertinent Vitals/ Pain       Pain Assessment Pain Assessment: No/denies pain  Home Living                                          Prior Functioning/Environment              Frequency  Min 2X/week        Progress Toward Goals  OT Goals(current goals can now be found in the care plan section)  Progress towards OT goals: Progressing toward goals  Acute Rehab OT Goals Patient Stated Goal: go home today OT Goal Formulation: With patient Time For Goal Achievement: 09/27/22 Potential to Achieve Goals: Good ADL Goals Pt Will Perform Grooming: with modified independence;standing Pt Will Transfer to Toilet: with modified independence;stand pivot transfer;bedside commode Pt Will Perform Toileting - Clothing Manipulation and hygiene: with modified independence;sitting/lateral leans;sit to/from stand Pt/caregiver will Perform Home Exercise Program: Left upper extremity;Independently;With written HEP provided Additional ADL Goal #1: Pt to tolerate wearing L resting hand splint > 4 hours without discomfort or signs of skin breakdown in order to prevent contractures  Plan Discharge plan remains appropriate    Co-evaluation                 AM-PAC OT "6 Clicks" Daily Activity     Outcome Measure   Help from another person eating meals?: A Little Help from another person taking care of personal grooming?: A Little Help from another person toileting, which includes using toliet, bedpan, or urinal?: A Lot Help from another person bathing (including washing, rinsing, drying)?: A Lot Help from another person to put on and taking off regular upper body clothing?: A Lot Help from another person to put on and taking off regular lower body clothing?: A Lot 6 Click Score: 14    End of Session    OT Visit Diagnosis: Other abnormalities of gait and  mobility (R26.89)   Activity Tolerance Patient tolerated treatment well   Patient Left in bed;with call bell/phone within reach   Nurse Communication Mobility status;Other (comment) (resting hand splint, order for prafo boot)        Time: 8119-1478 OT Time Calculation (min): 12 min  Charges: OT General Charges $OT Visit: 1 Visit OT Treatments $Therapeutic Activity: 8-22 mins  Almyra Free  B, OTR/L Acute Rehab Services Office: [phone]   Layla Maw 09/15/2022, 11:51 AM

## 2022-09-15 NOTE — Progress Notes (Signed)
Orthopedic Tech Progress Note Patient Details:  [name] [birth year] [id number]  Ortho Devices Type of Ortho Device: Prafo boot/shoe Ortho Device/Splint Location: LLE Ortho Device/Splint Interventions: Ordered, Adjustment, Application   Post Interventions Patient Tolerated: Well Instructions Provided: [facility] of [place] 09/15/2022, 12:02 PM

## 2022-09-15 NOTE — Discharge Instructions (Addendum)
Regarding your bladder infection you will need to continue antibiotics for 7 days.   Because your bladder has not been able to empty properly you will also need to keep in a foley catheter. It will be VERY important to follow up with the urologists at:   [facility]  [address] [place], [place]  [phone]  This appointment is 10/5 at 11 AM.   Please make sure you get your left heel looked at as well.

## 2022-09-15 NOTE — Inpatient Diabetes Management (Signed)
Inpatient Diabetes Program Recommendations  AACE/ADA: New Consensus Statement on Inpatient Glycemic Control (2015)  Target Ranges:  Prepandial:   less than 140 mg/dL      Peak postprandial:   less than 180 mg/dL (1-2 hours)      Critically ill patients:  140 - 180 mg/dL   Lab Results  Component Value Date   GLUCAP 105 (H) 09/15/2022   HGBA1C 13.3 (H) 09/13/2022    Spoke with pt regarding being discharged on insulin pen. Reviewed the steps of insulin pen. Pt reported knowing how to operate insulin pen. Told pt we were making sure she was on the basal insulin covered by insurance. Reviewed with pt when to take the insulin and to keep consistently taking it unless her glucose trends begin to drop close to 70.   Thanks,  Tama Headings RN, MSN, BC-ADM Inpatient Diabetes Coordinator Team Pager [phone] (8a-5p)

## 2022-09-15 NOTE — TOC Transition Note (Addendum)
Transition of Care [facility]) - CM/SW Discharge Note   Patient Details  Name: [name] MRN: [id number] Date of Birth: December 04, 1966  Transition of Care [facility]) CM/SW Contact:  Sharin Mons, RN Phone Number: 09/15/2022, 3:54 PM   Clinical Narrative:    Patient will DC to: home Anticipated DC date: 09/15/2022 Family notified: yes Transport by: car  Presents with c/o CP. Per MD patient ready for DC today . RN, patient, and  patient's family  notified of DC. Orders noted for home health and DME  needs. Pt agreeable to home health services. Pt without provider preference. Referral made with [facility] and accepted . Referral made with [facility] for quad cane. Hospital out of stock.[facility] will deliver equipment to pt's home in 2-3 days, [facility] to make pt aware. NCM  confirmed place of residence: [place] [address]. Pt states resides with daughter and granddaughter. States they will assist with her care once d/c . States she also receives 40.5 hrs / week of PCS.  [facility] to deliver RX meds to pt's bedside prior to d/c. Post hospital f/u noted on AVS.  RNCM will sign off for now as intervention is no longer needed. Please consult [place] again if new needs arise.     Pt's # [phone]  [name] Daughter [phone]  Final next level of care: Home w Home Health Services Barriers to Discharge: No Barriers Identified   Patient Goals and CMS Choice     Choice offered to / list presented to : Patient  Discharge Placement                       Discharge Plan and Services                DME Arranged: Kasandra Knudsen Dewitt Hoes) DME Agency: [facility] Date DME Agency Contacted: 09/15/22 Time DME Agency Contacted: [phone] Representative spoke with at DME Agency: [place]: PT, RN [place] Agency: [place] Date [place]: 09/15/22 Time [place]: [place] Representative spoke with at [place]: [place] Determinants of Health ([place]) Interventions     Readmission Risk Interventions     No data to display

## 2022-09-15 NOTE — Discharge Summary (Signed)
Name: [name] MRN: [id number] DOB: 1966-10-28 56 y.o. PCP: [name], MD  Date of Admission: 09/12/2022  3:42 AM Date of Discharge: 09/15/2022 Attending Physician: Velna Ochs, MD  Discharge Diagnosis: 1. 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)   Morbid obesity ([place])   Poorly controlled type 2 diabetes mellitus with neuropathy ([place])   AKI (acute kidney injury) ([place])   Tinea cruris    Discharge Medications: Allergies as of 09/15/2022       Reactions   Ace Inhibitors Anaphylaxis, Other (See Comments)   Swelling of the tongue and throat    Cleocin [clindamycin] Anaphylaxis, Swelling   Enalapril Maleate Anaphylaxis, Other (See Comments)   Swelling of the tongue and throat    Zestril [lisinopril] Anaphylaxis, Other (See Comments)   Swelling of the tongue and throat         Medication List     STOP taking these medications    calamine lotion       TAKE these medications    Accu-Chek Guide test strip Generic drug: glucose blood Use As Directed   Accu-Chek Guide w/Device Kit Use As Directed   Accu-Chek Softclix Lancets lancets Use As Directed   amLODipine 10 MG tablet Commonly known as: NORVASC TAKE ONE TABLET BY MOUTH EVERY DAY   atorvastatin 40 MG tablet Commonly known as: LIPITOR Take 1 tablet (40 mg total) by mouth daily.   ciprofloxacin 250 MG tablet Commonly known as: CIPRO Take 2 tablets (500 mg total) by mouth daily for 7 days. Start taking on: September 16, 2022   cyanocobalamin 1000 MCG tablet Take 1 tablet by mouth daily.   cyclobenzaprine 5 MG tablet Commonly known as: FLEXERIL TAKE ONE TABLET BY MOUTH THREE TIMES DAILY AS NEEDED FOR MUSCLE SPASMS What changed: See the new instructions.   diclofenac Sodium 1 % Gel Commonly known as: VOLTAREN APPLY 4 GRAMS TOPICALLY TO LEFT KNEE FOUR TIMES DAILY. What changed: See  the new instructions.   escitalopram 10 MG tablet Commonly known as: LEXAPRO Take 1 tablet (10 mg total) by mouth daily.   fluticasone 0.05 % cream Commonly known as: CUTIVATE APPLY TO THE AFFECTED AREA(S) TWICE DAILY What changed:  how much to take additional instructions   folic acid 1 MG tablet Commonly known as: FOLVITE TAKE ONE TABLET BY MOUTH EVERY DAY   furosemide 40 MG tablet Commonly known as: LASIX TAKE ONE TABLET BY MOUTH DAILY What changed: when to take this   gabapentin 300 MG capsule Commonly known as: NEURONTIN TAKE ONE CAPSULE BY MOUTH TWICE DAILY   hydrALAZINE 25 MG tablet Commonly known as: APRESOLINE Take 1 tablet (25 mg total) by mouth 3 (three) times daily.   isosorbide mononitrate 60 MG 24 hr tablet Commonly known as: IMDUR TAKE ONE AND ONE-HALF TABLETS BY MOUTH EVERY DAY   Klor-Con M20 20 MEQ tablet Generic drug: potassium chloride SA TAKE ONE TABLET BY MOUTH DAILY   Lantus SoloStar 100 UNIT/ML Solostar Pen Generic drug: insulin glargine Inject 25 Units into the skin daily. What changed: how much to take   levETIRAcetam 500 MG tablet Commonly known as: KEPPRA Take 1 tablet (500 mg total) by mouth 2 (two) times daily.   Linzess 290 MCG Caps capsule Generic drug: linaclotide TAKE 1 CAPSULE (290 MCG TOTAL) BY MOUTH DAILY AS NEEDED (CONSTIPATION). What changed: See the new instructions.   liver oil-zinc oxide 40 % ointment  Commonly known as: DESITIN Apply topically every 4 (four) hours.   metoprolol succinate 100 MG 24 hr tablet Commonly known as: TOPROL-XL TAKE ONE TABLET BY MOUTH EVERY DAY. last refill UNTIL office visit [phone] TO schedule What changed: See the new instructions.   pantoprazole 40 MG tablet Commonly known as: PROTONIX TAKE ONE TABLET BY MOUTH EVERY DAY   Pentips 31G X 8 MM Misc Generic drug: Insulin Pen Needle Use to inject insulin in the morning, at noon, in the evening, and at bedtime.   solifenacin 10  MG tablet Commonly known as: VESICARE TAKE ONE TABLET BY MOUTH EVERY DAY   spironolactone 100 MG tablet Commonly known as: Aldactone Take 1 tablet (100 mg total) by mouth daily.   Stool Softener/Laxative 8.6-50 MG tablet Generic drug: senna-docusate TAKE ONE TABLET BY MOUTH AT BEDTIME AS NEEDED FOR MILD CONSTIPATION What changed: See the new instructions.   Trulicity 3 DZ/3.2DJ Sopn Generic drug: Dulaglutide Inject 3 mg as directed once a week.   Vitamin D (Ergocalciferol) 1.25 MG (50000 UNIT) Caps capsule Commonly known as: DRISDOL Take 50,000 Units by mouth every 7 (seven) days.   Xarelto 20 MG Tabs tablet Generic drug: rivaroxaban TAKE ONE TABLET BY MOUTH EVERY DAY WITH SUPPER What changed: See the new instructions.               Durable Medical Equipment  (From admission, onward)           Start     Ordered   09/15/22 1544  For home use only DME Other see comment  Once       Comments: Quadcane  Question:  Length of Need  Answer:  Lifetime   09/15/22 1546            Disposition and follow-up:   [name] was discharged from [facility] in Good condition.  At the hospital follow up visit please address:  1.  Please ensure patient attended urology appointment, and status on compliance with Foley catheter.  She also was given ciprofloxacin 500 mg, ensure compliance with antibiotics.  And if she is having any UTI symptoms  2.  Labs / imaging needed at time of follow-up: CBC, BMP to assess kidney function and any signs of infection.  3.  Pending labs/ test needing follow-up: N/A  Follow-up Appointments:  Follow-up Information     [name], MD Follow up.   Specialty: Internal Medicine Contact information: [place] [address] [phone]         [facility] Follow up.   Specialty: [place] Why: [place] will provide your home health services, start of care  09/20/2022 Contact information: [place] [address] [place] Hospital Course by problem list:  #Emphysematous Cystitis Secondary to Bilateral Hydroureteronephrosis   Patient initially presented to the emergency department complaining of burning with urination, vaginal itching, and swelling urinary vagina that has been present for the past week or so.  At the time, she denies any fevers, chills, headaches, abdominal pain, hematuria.  She had a long history of recurrent yeast infections and UTIs since she suffered a stroke 9 years ago which may have led to a neurogenic bladder.  CT abdomen was done and revealed bilateral hydroureteronephrosis to the level of the thick wall bladder with luminal gas and perinephric stranding concerning for  emphysematous cystitis.  UA revealed hematuria, large leukocytes, many bacteria, and budding yeast.  She also has a history of mixed augments.  Foley was placed and bladder for decompression and Zosyn was started.  Urine cultures were attempted twice however both times seem to be contaminated.  Diflucan was initially started and from the thought that she may have a vaginal candidiasis however was discontinued 2 days into her hospital course.  Uncontrolled type 2 diabetes may have also played a factor in this.  Prescribed ciprofloxacin 500 mg a day for a week as an outpatient, and also send patient with a Foley catheter.  #Pre-Renal/ Post-Renal AKI  Patient's initial creatinine upon admission was 1.6, and was slightly improving day by day throughout her hospital course.  Her most recent creatinine was 1.07, almost within normal limits.  This could have been either in the setting of dehydration, or a postrenal cause due to the obstruction.   #Poorly controlled T2DM with neuropathy   Patient has a longstanding history of uncontrolled type 2 diabetes.  Her home regimen includes Trulicity 3 mg weekly, and Basaglar 30  units daily at home.  CBG on arrival was 467, and HbA1c was 13.  Was using Semglee to titrate, started at [facility] 30 units, then went up to [facility] 35 units, then went up to [facility] 40 units which left her blood sugar around 82.  Upon discharge, they recommended and instructed patient to continue the Basaglar at home, and the Trulicity 3 mg  #Anginal Chest Pain  #Chronic combined Systolic and Diastolic HF  #NICM s/p ICD Placement  #Atrial Fibrillation, on xarelto #HTN  Patient presented with anginal chest pain, which resolved with nitroglycerin on route to the ED.  Upon arrival to the ED she was no longer in chest pain.  Troponins were flat and EKG showed no acute ischemic changes.  ICD was interrogated in the ED without any abnormalities noted.  CT angio chest/abdomen/pelvis showed no acute abnormalities.  Last echo was done in August 2022, which showed a left ventricular ejection fraction of 25%, global hypokinesis, and grade 2 diastolic dysfunction.  She remained euvolemic on exam throughout her stay in the hospital.  And we can her on her home medications of Toprol-XL 100 mg daily, Xarelto, hydralazine 25 mg 3 times daily and Imdur 60 mg daily.  We held her Norvasc and spironolactone, however would recommend continuing those in the outpatient setting.   Discharge Exam:   BP 121/76 (BP Location: Right Arm)   Pulse 94   Temp 98.7 F (37.1 C) (Oral)   Resp 18   Ht $R'5\' 9"'uq$  (1.753 m)   Wt 65.8 kg   LMP 09/13/2019 (Exact Date)   SpO2 100%   BMI 21.41 kg/m  Discharge exam:   Constitutional: Middle-aged female, lying in bed, in moderate amount of pain from catheter Cardiovascular: Ventricularly paced rhythm, no murmurs rubs or gallops heard Pulmonary/Chest: normal work of breathing on room air, lungs clear to auscultation bilaterally Abdominal: soft, non-tender, non-distended Skin: warm and dry Extremities: upper/lower extremity pulses 2+, no lower extremity edema present Genitourinary:  Foley catheter in place Neurologic: Left hemiparesis, chronic  Pertinent Labs, Studies, and Procedures:     Latest Ref Rng & Units 09/15/2022    6:07 AM 09/14/2022    5:20 AM 09/13/2022    5:30 AM  CBC  WBC 4.0 - 10.5 K/uL 17.0  11.1  11.4   Hemoglobin 12.0 - 15.0 g/dL 10.8  9.1  9.1   Hematocrit  36.0 - 46.0 % 33.4  29.0  29.4   Platelets 150 - 400 K/uL 259  218  212        Latest Ref Rng & Units 09/15/2022    6:07 AM 09/14/2022    5:20 AM 09/13/2022    5:30 AM  BMP  Glucose 70 - 99 mg/dL 82  169  215   BUN 6 - 20 mg/dL $Remove'8  12  12   'QlihXyG$ Creatinine 0.44 - 1.00 mg/dL 1.07  1.25  1.28   Sodium 135 - 145 mmol/L 137  134  136   Potassium 3.5 - 5.1 mmol/L 3.5  3.5  4.0   Chloride 98 - 111 mmol/L 99  100  101   CO2 22 - 32 mmol/L $RemoveB'26  26  28   'VyLucEJv$ Calcium 8.9 - 10.3 mg/dL 8.7  8.3  8.4     ECHOCARDIOGRAM COMPLETE  Result Date: 09/13/2022    ECHOCARDIOGRAM REPORT   Patient Name:   CATHYANN KILFOYLE Date of Exam: 09/13/2022 Medical Rec #:  [id number]        Height:       69.0 in Accession #:    [id number]       Weight:       145.0 lb Date of Birth:  1966/10/09        BSA:          1.802 m Patient Age:    36 years         BP:           103/68 mmHg Patient Gender: F                HR:           87 bpm. Exam Location:  Inpatient Procedure: 2D Echo Indications:    chest pain  History:        Patient has prior history of Echocardiogram examinations, most                 recent 07/17/2021. Cardiomyopathy, Arrythmias:Atrial Fibrillation;                 Risk Factors:Diabetes and Dyslipidemia.  Sonographer:    Johny Chess RDCS Referring Phys: [id number] [place]  1. Left ventricular ejection fraction, by estimation, is 25 to 30%. The left ventricle has severely decreased function. The left ventricle demonstrates global hypokinesis. There is moderate left ventricular hypertrophy. Left ventricular diastolic parameters are consistent with Grade I diastolic dysfunction (impaired relaxation).  2. Right  ventricular systolic function is mildly reduced. The right ventricular size is normal. Tricuspid regurgitation signal is inadequate for assessing PA pressure.  3. The mitral valve is normal in structure. No evidence of mitral valve regurgitation.  4. The aortic valve is tricuspid. Aortic valve regurgitation is not visualized. No aortic stenosis is present.  5. The inferior vena cava is normal in size with greater than 50% respiratory variability, suggesting right atrial pressure of 3 mmHg. FINDINGS  Left Ventricle: Left ventricular ejection fraction, by estimation, is 25 to 30%. The left ventricle has severely decreased function. The left ventricle demonstrates global hypokinesis. The left ventricular internal cavity size was normal in size. There is moderate left ventricular hypertrophy. Left ventricular diastolic parameters are consistent with Grade I diastolic dysfunction (impaired relaxation). Right Ventricle: The right ventricular size is normal. No increase in right ventricular wall thickness. Right ventricular systolic function is mildly reduced. Tricuspid regurgitation signal is inadequate for assessing PA pressure. Left Atrium:  Left atrial size was normal in size. Right Atrium: Right atrial size was normal in size. Pericardium: There is no evidence of pericardial effusion. Mitral Valve: The mitral valve is normal in structure. No evidence of mitral valve regurgitation. Tricuspid Valve: The tricuspid valve is normal in structure. Tricuspid valve regurgitation is trivial. Aortic Valve: The aortic valve is tricuspid. Aortic valve regurgitation is not visualized. No aortic stenosis is present. Pulmonic Valve: The pulmonic valve was not well visualized. Pulmonic valve regurgitation is not visualized. Aorta: The aortic root and ascending aorta are structurally normal, with no evidence of dilitation. Venous: The inferior vena cava is normal in size with greater than 50% respiratory variability, suggesting right  atrial pressure of 3 mmHg. IAS/Shunts: The interatrial septum was not well visualized.  LEFT VENTRICLE PLAX 2D LVIDd:         3.90 cm     Diastology LVIDs:         3.40 cm     LV e' medial:    6.64 cm/s LV PW:         1.40 cm     LV E/e' medial:  11.8 LV IVS:        1.20 cm     LV e' lateral:   5.87 cm/s LVOT diam:     2.20 cm     LV E/e' lateral: 13.3 LV SV:         53 LV SV Index:   30 LVOT Area:     3.80 cm  LV Volumes (MOD) LV vol d, MOD A4C: 97.2 ml LV vol s, MOD A4C: 80.0 ml LV SV MOD A4C:     97.2 ml RIGHT VENTRICLE            IVC RV S prime:     9.14 cm/s  IVC diam: 1.80 cm TAPSE (M-mode): 1.0 cm LEFT ATRIUM           Index        RIGHT ATRIUM           Index LA diam:      3.40 cm 1.89 cm/m   RA Area:     10.10 cm LA Vol (A2C): 36.0 ml 19.98 ml/m  RA Volume:   20.50 ml  11.38 ml/m LA Vol (A4C): 45.2 ml 25.08 ml/m  AORTIC VALVE LVOT Vmax:   87.80 cm/s LVOT Vmean:  54.900 cm/s LVOT VTI:    0.140 m  AORTA Ao Root diam: 3.50 cm Ao Asc diam:  3.50 cm MITRAL VALVE MV Area (PHT): 5.27 cm     SHUNTS MV Decel Time: 144 msec     Systemic VTI:  0.14 m MV E velocity: 78.10 cm/s   Systemic Diam: 2.20 cm MV A velocity: 110.00 cm/s MV E/A ratio:  0.71 Oswaldo Milian MD Electronically signed by Oswaldo Milian MD Signature Date/Time: 09/13/2022/3:41:59 PM    Final    CT Angio Chest/Abd/Pel for Dissection W and/or W/WO  Result Date: 09/12/2022 CLINICAL DATA:  Chest pain after argument EXAM: CT ANGIOGRAPHY CHEST, ABDOMEN AND PELVIS TECHNIQUE: Non-contrast CT of the chest was initially obtained. Multidetector CT imaging through the chest, abdomen and pelvis was performed using the standard protocol during bolus administration of intravenous contrast. Multiplanar reconstructed images and MIPs were obtained and reviewed to evaluate the vascular anatomy. RADIATION DOSE REDUCTION: This exam was performed according to the departmental dose-optimization program which includes automated exposure control,  adjustment of the mA and/or kV according to patient size and/or use  of iterative reconstruction technique. CONTRAST:  133mL OMNIPAQUE IOHEXOL 350 MG/ML SOLN COMPARISON:  09/24/2013 FINDINGS: CTA CHEST FINDINGS Cardiovascular: Cardiomegaly. Biventricular pacer. Negative for aortic dissection or aneurysm. No pericardial effusion. Unremarkable pulmonary arteries. Mediastinum/Nodes: Negative for mass or worrisome lymph node Lungs/Pleura: There is no edema, consolidation, effusion, or pneumothorax. Musculoskeletal: No acute or aggressive finding Review of the MIP images confirms the above findings. CTA ABDOMEN AND PELVIS FINDINGS VASCULAR Aorta: Scattered atheromatous plaque.  No dissection or aneurysm Celiac: Unremarkable SMA: Unremarkable Renals: Single bilateral renal arteries. Atheromatous plaque at the right ostium without flow limiting stenosis. Negative for beading. IMA: Patent Inflow: Unremarkable Veins: Unremarkable in the arterial phase Review of the MIP images confirms the above findings. NON-VASCULAR Hepatobiliary: Suspect hepatic steatosis, but limited by contrast timing. Pancreas: Negative Spleen: Negative Adrenals/Urinary Tract: Negative adrenals. Bilateral hydroureteronephrosis to the level of the thick walled bladder which also contains gas. Prominent perinephric fat stranding. Moderate distension of the bladder, a gas containing diverticulum is seen at the dome. Stomach/Bowel: Negative for obstruction or visible inflammation Lymphatic: Negative for mass or adenopathy Reproductive: Unremarkable Other: No ascites or pneumoperitoneum Musculoskeletal: Lumbar spine degeneration with especially advanced disc degeneration at L2-3 Review of the MIP images confirms the above findings. IMPRESSION: 1. Negative for acute aortic syndrome. Atherosclerosis of the abdominal aorta. 2. Bilateral hydroureteronephrosis to the level of the thick walled bladder which shows luminal gas and perinephric stranding, presumed UTI.  Electronically Signed   By: Jorje Guild M.D.   On: 09/12/2022 06:58   DG Chest Port 1 View  Result Date: 09/12/2022 CLINICAL DATA:  Chest pain. EXAM: PORTABLE CHEST 1 VIEW COMPARISON:  07/20/2016 FINDINGS: There is a left chest wall ICD with leads in the right atrial appendage, right ventricle in coronary sinus. Heart size and mediastinal contours are stable. There is no pleural effusion or edema identified. IMPRESSION: 1. No active cardiopulmonary abnormalities. Electronically Signed   By: Kerby Moors M.D.   On: 09/12/2022 04:23     Discharge Instructions: Discharge Instructions     Call MD for:  severe uncontrolled pain   Complete by: As directed    Call MD for:  temperature >100.4   Complete by: As directed    Diet - low sodium heart healthy   Complete by: As directed    Increase activity slowly   Complete by: As directed    Increase activity slowly   Complete by: As directed    No wound care   Complete by: As directed    No wound care   Complete by: As directed        Signed: Drucie Opitz, MD 09/15/2022, 4:05 PM   Pager: [phone]

## 2022-09-21 ENCOUNTER — Other Ambulatory Visit (HOSPITAL_COMMUNITY): Payer: Self-pay | Admitting: Cardiology

## 2022-09-21 ENCOUNTER — Telehealth: Payer: Self-pay | Admitting: *Deleted

## 2022-09-21 NOTE — Telephone Encounter (Signed)
Call from [facility] [place] for [facility].  Reporting delay of services d/t staff shortage; pt was discharged from the hospital with PT and nursing referrals. They were unable to see pt yesterday but pt will be seen today,.

## 2022-09-22 ENCOUNTER — Encounter: Payer: Self-pay | Admitting: Student

## 2022-09-22 ENCOUNTER — Encounter: Payer: Medicaid Other | Admitting: Student

## 2022-09-23 ENCOUNTER — Ambulatory Visit: Payer: Medicaid Other | Attending: Internal Medicine | Admitting: Internal Medicine

## 2022-09-23 VITALS — BP 114/70 | Ht 69.0 in | Wt 219.0 lb

## 2022-09-23 DIAGNOSIS — Z95810 Presence of automatic (implantable) cardiac defibrillator: Secondary | ICD-10-CM | POA: Diagnosis present

## 2022-09-23 DIAGNOSIS — I5042 Chronic combined systolic (congestive) and diastolic (congestive) heart failure: Secondary | ICD-10-CM

## 2022-09-23 DIAGNOSIS — I4891 Unspecified atrial fibrillation: Secondary | ICD-10-CM | POA: Diagnosis present

## 2022-09-23 DIAGNOSIS — I447 Left bundle-branch block, unspecified: Secondary | ICD-10-CM | POA: Insufficient documentation

## 2022-09-23 DIAGNOSIS — I428 Other cardiomyopathies: Secondary | ICD-10-CM | POA: Insufficient documentation

## 2022-09-23 NOTE — Progress Notes (Deleted)
CC: Hospital f/u visit  HPI:  [name] is a 56 y.o. female with past medical history of HTN, HLD, CVA (2014), a-fib, HFrEF (EF 25-30% in 09/2022), T2DM, OSA, and obesity that presents for a hospital f/u visit.    Allergies as of 09/23/2022       Reactions   Ace Inhibitors Anaphylaxis, Other (See Comments)   Swelling of the tongue and throat    Cleocin [clindamycin] Anaphylaxis, Swelling   Enalapril Maleate Anaphylaxis, Other (See Comments)   Swelling of the tongue and throat    Zestril [lisinopril] Anaphylaxis, Other (See Comments)   Swelling of the tongue and throat         Medication List        Accurate as of September 23, 2022  8:35 AM. If you have any questions, ask your nurse or doctor.          Accu-Chek Guide test strip Generic drug: glucose blood Use As Directed   Accu-Chek Guide w/Device Kit Use As Directed   Accu-Chek Softclix Lancets lancets Use As Directed   amLODipine 10 MG tablet Commonly known as: NORVASC TAKE ONE TABLET BY MOUTH EVERY DAY   atorvastatin 40 MG tablet Commonly known as: LIPITOR Take 1 tablet (40 mg total) by mouth daily.   ciprofloxacin 250 MG tablet Commonly known as: CIPRO Take 2 tablets (500 mg total) by mouth daily for 7 days.   cyanocobalamin 1000 MCG tablet Take 1 tablet by mouth daily.   cyclobenzaprine 5 MG tablet Commonly known as: FLEXERIL TAKE ONE TABLET BY MOUTH THREE TIMES DAILY AS NEEDED FOR MUSCLE SPASMS What changed: See the new instructions.   diclofenac Sodium 1 % Gel Commonly known as: VOLTAREN APPLY 4 GRAMS TOPICALLY TO LEFT KNEE FOUR TIMES DAILY. What changed: See the new instructions.   escitalopram 10 MG tablet Commonly known as: LEXAPRO Take 1 tablet (10 mg total) by mouth daily.   fluticasone 0.05 % cream Commonly known as: CUTIVATE APPLY TO THE AFFECTED AREA(S) TWICE DAILY What changed:  how much to take additional instructions   folic acid 1 MG tablet Commonly known  as: FOLVITE TAKE ONE TABLET BY MOUTH EVERY DAY   furosemide 40 MG tablet Commonly known as: LASIX TAKE ONE TABLET BY MOUTH DAILY What changed: when to take this   gabapentin 300 MG capsule Commonly known as: NEURONTIN TAKE ONE CAPSULE BY MOUTH TWICE DAILY   hydrALAZINE 25 MG tablet Commonly known as: APRESOLINE Take 1 tablet (25 mg total) by mouth 3 (three) times daily.   isosorbide mononitrate 60 MG 24 hr tablet Commonly known as: IMDUR TAKE ONE AND ONE-HALF TABLETS BY MOUTH EVERY DAY   Klor-Con M20 20 MEQ tablet Generic drug: potassium chloride SA TAKE ONE TABLET BY MOUTH DAILY   Lantus SoloStar 100 UNIT/ML Solostar Pen Generic drug: insulin glargine Inject 25 Units into the skin daily.   levETIRAcetam 500 MG tablet Commonly known as: KEPPRA Take 1 tablet (500 mg total) by mouth 2 (two) times daily.   Linzess 290 MCG Caps capsule Generic drug: linaclotide TAKE 1 CAPSULE (290 MCG TOTAL) BY MOUTH DAILY AS NEEDED (CONSTIPATION). What changed: See the new instructions.   liver oil-zinc oxide 40 % ointment Commonly known as: DESITIN Apply topically every 4 (four) hours.   metoprolol succinate 100 MG 24 hr tablet Commonly known as: TOPROL-XL TAKE ONE TABLET BY MOUTH EVERY DAY. last refill UNTIL office visit [phone] TO schedule   pantoprazole 40 MG tablet Commonly known  as: PROTONIX TAKE ONE TABLET BY MOUTH EVERY DAY   Pentips 31G X 8 MM Misc Generic drug: Insulin Pen Needle Use to inject insulin in the morning, at noon, in the evening, and at bedtime.   spironolactone 100 MG tablet Commonly known as: Aldactone Take 1 tablet (100 mg total) by mouth daily.   Stool Softener/Laxative 8.6-50 MG tablet Generic drug: senna-docusate TAKE ONE TABLET BY MOUTH AT BEDTIME AS NEEDED FOR MILD CONSTIPATION What changed: See the new instructions.   Trulicity 3 WS/5.6CL Sopn Generic drug: Dulaglutide Inject 3 mg as directed once a week.   Vitamin D (Ergocalciferol)  1.25 MG (50000 UNIT) Caps capsule Commonly known as: DRISDOL Take 50,000 Units by mouth every 7 (seven) days.   Xarelto 20 MG Tabs tablet Generic drug: rivaroxaban TAKE ONE TABLET BY MOUTH EVERY DAY WITH SUPPER What changed: See the new instructions.         Past Medical History:  Diagnosis Date   Acute cystitis 06/19/2021   AKI (acute kidney injury) ([place]) 09/13/2022   Allergic rhinitis    Arthritis    "hips, back, legs, arms" (07/04/2014)   Asthma    hx   Automatic implantable cardioverter-defibrillator in situ    Calcifying tendinitis of shoulder    Chronic combined systolic and diastolic CHF (congestive heart failure) ([place])    EF 40-45% by echo 12/06/2012   Chronic diastolic heart failure ([place])     Primarily diastolic CHF: Likely due to uncontrolled HTN. Last echo (8/12) with EF 45-50%, mild to moderate LVH with some asymmetric septal hypertrophy, RV normal size and systolic function. EF 50-55% by LV-gram in 6/12.    Chronic lower back pain    secondary to DJD, obsetiy, hip problems. Followed by Dr. Oval Linsey (pain management)   Chronic systolic heart failure ([place]) 05/17/2022   Chronic use of opiate for therapeutic purpose 12/14/2016   Contact with and (suspected) exposure to covid-19 02/07/2020   Coronary artery disease    questionable. [place] 05/2011 showing normal coronaries // Followed at [facility], Dr. Aundra Dubin   Degeneration of lumbar or lumbosacral intervertebral disc    DJD (degenerative joint disease) of hip    right sided   Fatigue 04/27/2022   Frequent UTI    GERD (gastroesophageal reflux disease)    HLD (hyperlipidemia)    Hypertension    Poorly controlled. Has had HTN since age 62. Angioedema with ACEI.  24 Hr urine and renal arterial dopplers ordered . . . Never done   LBBB (left bundle branch block)    Left shoulder pain 06/30/2015   Left spastic hemiparesis ([place]) 09/23/2015   Liver disease    Lumbago 11/22/2016   Morbid obesity ([place])    Muscle  spasm 06/19/2021   Need for pneumococcal vaccine 09/09/2017   NICM (nonischemic cardiomyopathy) ([place])    EF 45-50% in 8/12, cath 6/12 showed normal coronaries, EF 50-55% by LV gram   OSA on CPAP    sleep study in 8/12 showed moderate to severe OSA requiring CPAP   Perimenopausal 03/28/2017   Polyneuropathy in diabetes(357.2)    Presence of permanent cardiac pacemaker    Rash 07/14/2016   Seizures ([place])    last 3 months   Shortness of breath    none now   Stroke [facility]) 12/2013   "my left side is paralyzed" (07/04/2014)   Thoracic or lumbosacral neuritis or radiculitis, unspecified    Type II diabetes mellitus ([place]) DX: 2002   Urinary frequency 11/05/2014  Vaginal discharge 02/05/2021   Review of Systems:  per HPI.   Physical Exam: *** There were no vitals filed for this visit.  *** Constitutional: Well-developed, well-nourished, appears comfortable  HENT: Normocephalic and atraumatic.  Eyes: EOM are normal. PERRL.  Neck: Normal range of motion.  Cardiovascular: Regular rate, regular rhythm. No murmurs, rubs, or gallops. Normal radial and PT pulses bilaterally. No LE edema.  Pulmonary: Normal respiratory effort. No wheezes, rales, or rhonchi.   Abdominal: Soft. Non-distended. No tenderness. Normal bowel sounds.  Musculoskeletal: Normal range of motion.     Neurological: Alert and oriented to person, place, and time. Non-focal. Skin: warm and dry.    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 Patient presented w/ dysuria and vaginal itching.  Patient was prescribed ciprofloxacin 500 mg daily for 1 week as and sent home with foley catheter.   Plan: -   2. T2DM - Current medications include  Lantus 25 units daily, Trulicity 32m weekly, gabapentin 300 MG BID. Patient states that he is *** compliant with this medication. Patient denies polyuria, polydipsia, fatigue. A1c was 13.3 in 09/2022. Patient states that he does *** visit the ophthalmologist for yearly eye exams. Foot exam today ***.   Plan: - Urine ACR today  3. HFrEF/a-fib/HTn - Echo in 09/2022 demonstrates EF 25-30% w/ severely decreased LV function and global hypokinesis of the LV. Current medications include amLODipine 10 MG, hydrALAZINE 25 MG TID, isosorbide mononitrate 60 MG (1.5 tablets daily), spironolactone 100 MG, metoprolol succinate 100 MG, furosemide 40 MG, and Xarelto 20 MG.   Plan: - Cardiology f/u?  4. Health Screening: - Influenza vaccine (), colonoscopy (), pap smear () - Medication refill?  Plan: -   See Encounters Tab for problem based charting.  Patient seen with Dr. {Barbaraann Boys

## 2022-09-23 NOTE — Patient Instructions (Addendum)
Medication Instructions:  Your physician recommends that you continue on your current medications as directed. Please refer to the Current Medication list given to you today.  *If you need a refill on your cardiac medications before your next appointment, please call your pharmacy*   Lab Work: None ordered.  If you have labs (blood work) drawn today and your tests are completely normal, you will receive your results only by: [place] (if you have MyChart) OR A paper copy in the mail If you have any lab test that is abnormal or we need to change your treatment, we will call you to review the results.   Testing/Procedures: None ordered.    Follow-Up: At [facility], you and your health needs are our priority.  As part of our continuing mission to provide you with exceptional heart care, we have created designated Provider Care Teams.  These Care Teams include your primary Cardiologist (physician) and Advanced Practice Providers (APPs -  Physician Assistants and Nurse Practitioners) who all work together to provide you with the care you need, when you need it.  We recommend signing up for the patient portal called "MyChart".  Sign up information is provided on this After Visit Summary.  MyChart is used to connect with patients for Virtual Visits (Telemedicine).  Patients are able to view lab/test results, encounter notes, upcoming appointments, etc.  Non-urgent messages can be sent to your provider as well.   To learn more about what you can do with MyChart, go to [email or web address].    Your next appointment:   12 months with Dr Caryl Comes  For the next 3 days please increase your Furosemide to 2 tablets (80mg ) then return to your original dosing of 1 tablet. (40mg )  Important Information About Sugar
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
## 2022-09-24 ENCOUNTER — Telehealth: Payer: Self-pay | Admitting: *Deleted

## 2022-09-24 LAB — CUP PACEART INCLINIC DEVICE CHECK
Battery Remaining Longevity: 75 mo
Battery Voltage: 2.98 V
Brady Statistic AP VP Percent: 0.04 %
Brady Statistic AP VS Percent: 0.02 %
Brady Statistic AS VP Percent: 98.69 %
Brady Statistic AS VS Percent: 1.25 %
Brady Statistic RA Percent Paced: 0.07 %
Brady Statistic RV Percent Paced: 17.22 %
Date Time Interrogation Session: 20231012163500
HighPow Impedance: 53 Ohm
Implantable Lead Implant Date: 20140910
Implantable Lead Implant Date: 20140910
Implantable Lead Implant Date: 20140910
Implantable Lead Location: 753858
Implantable Lead Location: 753859
Implantable Lead Location: 753860
Implantable Lead Model: 4298
Implantable Lead Model: 5076
Implantable Lead Model: 6935
Implantable Pulse Generator Implant Date: 20220406
Lead Channel Impedance Value: 171 Ohm
Lead Channel Impedance Value: 184.154
Lead Channel Impedance Value: 191.854
Lead Channel Impedance Value: 191.854
Lead Channel Impedance Value: 208.568
Lead Channel Impedance Value: 342 Ohm
Lead Channel Impedance Value: 342 Ohm
Lead Channel Impedance Value: 342 Ohm
Lead Channel Impedance Value: 399 Ohm
Lead Channel Impedance Value: 399 Ohm
Lead Channel Impedance Value: 437 Ohm
Lead Channel Impedance Value: 437 Ohm
Lead Channel Impedance Value: 494 Ohm
Lead Channel Impedance Value: 627 Ohm
Lead Channel Impedance Value: 646 Ohm
Lead Channel Impedance Value: 665 Ohm
Lead Channel Impedance Value: 665 Ohm
Lead Channel Impedance Value: 703 Ohm
Lead Channel Pacing Threshold Amplitude: 0.5 V
Lead Channel Pacing Threshold Amplitude: 0.625 V
Lead Channel Pacing Threshold Amplitude: 1 V
Lead Channel Pacing Threshold Pulse Width: 0.4 ms
Lead Channel Pacing Threshold Pulse Width: 0.4 ms
Lead Channel Pacing Threshold Pulse Width: 0.4 ms
Lead Channel Sensing Intrinsic Amplitude: 11.25 mV
Lead Channel Sensing Intrinsic Amplitude: 12.75 mV
Lead Channel Sensing Intrinsic Amplitude: 3.125 mV
Lead Channel Sensing Intrinsic Amplitude: 3.25 mV
Lead Channel Setting Pacing Amplitude: 1.5 V
Lead Channel Setting Pacing Amplitude: 1.5 V
Lead Channel Setting Pacing Amplitude: 2 V
Lead Channel Setting Pacing Pulse Width: 0.4 ms
Lead Channel Setting Pacing Pulse Width: 0.4 ms
Lead Channel Setting Sensing Sensitivity: 0.3 mV

## 2022-09-24 NOTE — Telephone Encounter (Signed)
Called patient left voice message for patient regarding her missed appointment/ lvm for patient to call the main number at [phone] to reschedule.

## 2022-09-24 NOTE — Progress Notes (Deleted)
CC: Hospital f/u visit  HPI:  [name] is a 56 y.o. female with past medical history of HTN, HLD, CVA (2015), HFrEF (EF 25-30% in 09/2022), a-fib, T2DM, OSA, obesity, and lumbar radiculopathy that presents for a hospital f/u visit.    Allergies as of 09/24/2022       Reactions   Ace Inhibitors Anaphylaxis, Other (See Comments)   Swelling of the tongue and throat    Cleocin [clindamycin] Anaphylaxis, Swelling   Enalapril Maleate Anaphylaxis, Other (See Comments)   Swelling of the tongue and throat    Zestril [lisinopril] Anaphylaxis, Other (See Comments)   Swelling of the tongue and throat         Medication List        Accurate as of September 24, 2022  8:12 AM. If you have any questions, ask your nurse or doctor.          Accu-Chek Guide test strip Generic drug: glucose blood Use As Directed   Accu-Chek Guide w/Device Kit Use As Directed   Accu-Chek Softclix Lancets lancets Use As Directed   amLODipine 10 MG tablet Commonly known as: NORVASC TAKE ONE TABLET BY MOUTH EVERY DAY   atorvastatin 40 MG tablet Commonly known as: LIPITOR Take 1 tablet (40 mg total) by mouth daily.   cyanocobalamin 1000 MCG tablet Take 1 tablet by mouth daily.   cyclobenzaprine 5 MG tablet Commonly known as: FLEXERIL TAKE ONE TABLET BY MOUTH THREE TIMES DAILY AS NEEDED FOR MUSCLE SPASMS What changed: See the new instructions.   diclofenac Sodium 1 % Gel Commonly known as: VOLTAREN APPLY 4 GRAMS TOPICALLY TO LEFT KNEE FOUR TIMES DAILY. What changed: See the new instructions.   escitalopram 10 MG tablet Commonly known as: LEXAPRO Take 1 tablet (10 mg total) by mouth daily.   fluticasone 0.05 % cream Commonly known as: CUTIVATE APPLY TO THE AFFECTED AREA(S) TWICE DAILY What changed:  how much to take additional instructions   folic acid 1 MG tablet Commonly known as: FOLVITE TAKE ONE TABLET BY MOUTH EVERY DAY   furosemide 40 MG tablet Commonly known as:  LASIX TAKE ONE TABLET BY MOUTH DAILY What changed: when to take this   gabapentin 300 MG capsule Commonly known as: NEURONTIN TAKE ONE CAPSULE BY MOUTH TWICE DAILY   hydrALAZINE 25 MG tablet Commonly known as: APRESOLINE Take 1 tablet (25 mg total) by mouth 3 (three) times daily.   isosorbide mononitrate 60 MG 24 hr tablet Commonly known as: IMDUR TAKE ONE AND ONE-HALF TABLETS BY MOUTH EVERY DAY   Klor-Con M20 20 MEQ tablet Generic drug: potassium chloride SA TAKE ONE TABLET BY MOUTH DAILY   Lantus SoloStar 100 UNIT/ML Solostar Pen Generic drug: insulin glargine Inject 25 Units into the skin daily.   levETIRAcetam 500 MG tablet Commonly known as: KEPPRA Take 1 tablet (500 mg total) by mouth 2 (two) times daily.   Linzess 290 MCG Caps capsule Generic drug: linaclotide TAKE 1 CAPSULE (290 MCG TOTAL) BY MOUTH DAILY AS NEEDED (CONSTIPATION). What changed: See the new instructions.   liver oil-zinc oxide 40 % ointment Commonly known as: DESITIN Apply topically every 4 (four) hours.   metoprolol succinate 100 MG 24 hr tablet Commonly known as: TOPROL-XL TAKE ONE TABLET BY MOUTH EVERY DAY. last refill UNTIL office visit [phone] TO schedule   pantoprazole 40 MG tablet Commonly known as: PROTONIX TAKE ONE TABLET BY MOUTH EVERY DAY   Pentips 31G X 8 MM Misc Generic drug: Insulin  Pen Needle Use to inject insulin in the morning, at noon, in the evening, and at bedtime.   spironolactone 100 MG tablet Commonly known as: Aldactone Take 1 tablet (100 mg total) by mouth daily.   Stool Softener/Laxative 8.6-50 MG tablet Generic drug: senna-docusate TAKE ONE TABLET BY MOUTH AT BEDTIME AS NEEDED FOR MILD CONSTIPATION What changed: See the new instructions.   Trulicity 3 TF/5.7DU Sopn Generic drug: Dulaglutide Inject 3 mg as directed once a week.   Vitamin D (Ergocalciferol) 1.25 MG (50000 UNIT) Caps capsule Commonly known as: DRISDOL Take 50,000 Units by mouth every 7  (seven) days.   Xarelto 20 MG Tabs tablet Generic drug: rivaroxaban TAKE ONE TABLET BY MOUTH EVERY DAY WITH SUPPER What changed: See the new instructions.         Past Medical History:  Diagnosis Date   Acute cystitis 06/19/2021   AKI (acute kidney injury) ([place]) 09/13/2022   Allergic rhinitis    Arthritis    "hips, back, legs, arms" (07/04/2014)   Asthma    hx   Automatic implantable cardioverter-defibrillator in situ    Calcifying tendinitis of shoulder    Chronic combined systolic and diastolic CHF (congestive heart failure) ([place])    EF 40-45% by echo 12/06/2012   Chronic diastolic heart failure ([place])     Primarily diastolic CHF: Likely due to uncontrolled HTN. Last echo (8/12) with EF 45-50%, mild to moderate LVH with some asymmetric septal hypertrophy, RV normal size and systolic function. EF 50-55% by LV-gram in 6/12.    Chronic lower back pain    secondary to DJD, obsetiy, hip problems. Followed by Dr. Oval Linsey (pain management)   Chronic systolic heart failure ([place]) 05/17/2022   Chronic use of opiate for therapeutic purpose 12/14/2016   Contact with and (suspected) exposure to covid-19 02/07/2020   Coronary artery disease    questionable. [place] 05/2011 showing normal coronaries // Followed at [facility], Dr. Aundra Dubin   Degeneration of lumbar or lumbosacral intervertebral disc    DJD (degenerative joint disease) of hip    right sided   Fatigue 04/27/2022   Frequent UTI    GERD (gastroesophageal reflux disease)    HLD (hyperlipidemia)    Hypertension    Poorly controlled. Has had HTN since age 89. Angioedema with ACEI.  24 Hr urine and renal arterial dopplers ordered . . . Never done   LBBB (left bundle branch block)    Left shoulder pain 06/30/2015   Left spastic hemiparesis ([place]) 09/23/2015   Liver disease    Lumbago 11/22/2016   Morbid obesity ([place])    Muscle spasm 06/19/2021   Need for pneumococcal vaccine 09/09/2017   NICM (nonischemic cardiomyopathy)  ([place])    EF 45-50% in 8/12, cath 6/12 showed normal coronaries, EF 50-55% by LV gram   OSA on CPAP    sleep study in 8/12 showed moderate to severe OSA requiring CPAP   Perimenopausal 03/28/2017   Polyneuropathy in diabetes(357.2)    Presence of permanent cardiac pacemaker    Rash 07/14/2016   Seizures ([place])    last 3 months   Shortness of breath    none now   Stroke [facility]) 12/2013   "my left side is paralyzed" (07/04/2014)   Thoracic or lumbosacral neuritis or radiculitis, unspecified    Type II diabetes mellitus ([place]) DX: 2002   Urinary frequency 11/05/2014   Vaginal discharge 02/05/2021   Review of Systems:  per HPI.   Physical Exam: *** There were no  vitals filed for this visit.  *** Constitutional: Well-developed, well-nourished, appears comfortable  HENT: Normocephalic and atraumatic.  Eyes: EOM are normal. PERRL.  Neck: Normal range of motion.  Cardiovascular: Regular rate, regular rhythm. No murmurs, rubs, or gallops. Normal radial and PT pulses bilaterally. No LE edema.  Pulmonary: Normal respiratory effort. No wheezes, rales, or rhonchi.   Abdominal: Soft. Non-distended. No tenderness. Normal bowel sounds.  Musculoskeletal: Normal range of motion.     Neurological: Alert and oriented to person, place, and time. Non-focal. Skin: warm and dry.    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 Patient was discharged on 10/4 after being admitted for emphysematous cystitis secondary to bilateral hydroureteronephrosis noted on CT. Patient presented w/ dysuria and vaginal itching. Also had mild AKI while hospitalized. She was discharged on ciprofloxacin 500 mg for 1 week and with a foley catheter. Patient states that her symptoms have currently  resolved.   Plan: - CBC and BMP today   2. HFrEF (EF 25-30% in 09/2022)/HTN - Patient was discharged on 10/4 after being admitted partially for chest pain evaluation. Echo from 09/2022 demonstrates EF 25-30% w/ global hypokinesis of the LV and Grade 1 diastolic dysfunction. Current medications include amLODipine 10 MG, metoprolol succinate 100 MG, spironolactone 100 MG, hydrALAZINE 25 MG TID,  isosorbide mononitrate 60 MG (1.5 tablets daily), and furosemide 40 MG. Patient states that she is *** compliant with these medications. Patient states that she does *** check his BP regularly at home. Patient denies HA, lightheadedness, dizziness, CP, or SOB. Initial BP today is ***. Repeat BP is ***.   Plan: - Continue amLODipine 10 MG, metoprolol succinate 100 MG, spironolactone 100 MG, hydrALAZINE 25 MG TID,  isosorbide mononitrate 60 MG (1.5 tablets daily), and furosemide 40 MG   3. T2DM  - Current medications include Lantus 25 units daily, Trulicity 3 MG weekly, and gabapentin 300 MG. Patient states that she is *** compliant with these medications. Patient does *** check her blood sugar at home regularly and notes values ***. Patient denies polyuria, polydipsia, fatigue. A1c was *** in ***. A1c today ***. Patient states that she does *** visit the ophthalmologist for yearly eye exams. Foot exam today ***.   Plan: - Urine ACR today - Continue  Lantus 25 units daily, Trulicity 3 MG weekly, and gabapentin 300 MG   4. HLD - Current medications include atorvastatin 40 MG . Patient states that she is *** compliant with these medications. Lipid panel from 06/2021 WNL w/ exception of LDL 112 and HDL 37.    Plan: - Lipid panel today - Continue   4. Health Screening: -  Influenza vaccine (), colonoscopy (),  - Medication refill?  Plan: -   See Encounters Tab for problem based charting.  Patient seen with Dr. Barbaraann Boys

## 2022-09-28 ENCOUNTER — Telehealth: Payer: Self-pay | Admitting: *Deleted

## 2022-09-28 NOTE — Telephone Encounter (Signed)
Call from patient spoke with C. Cyndi Bender.  Said that she has disconnected her foley Catheter inn her chair accidentally and Urine is leaking out.  RTC to patient message left that the Clinics had called.  Call to [facility].  Spoke with [place] Nurse Manager who will call patient.

## 2022-10-05 NOTE — Progress Notes (Signed)
No ICM remote transmission received for 10/04/2022 and next ICM transmission scheduled for 10/25/2022.   

## 2022-10-08 NOTE — Telephone Encounter (Signed)
ERROR

## 2022-10-17 ENCOUNTER — Other Ambulatory Visit: Payer: Self-pay | Admitting: Internal Medicine

## 2022-10-17 DIAGNOSIS — F331 Major depressive disorder, recurrent, moderate: Secondary | ICD-10-CM

## 2022-10-18 ENCOUNTER — Encounter: Payer: Self-pay | Admitting: Student

## 2022-10-21 ENCOUNTER — Other Ambulatory Visit: Payer: Self-pay | Admitting: Student

## 2022-10-21 DIAGNOSIS — I4891 Unspecified atrial fibrillation: Secondary | ICD-10-CM

## 2022-10-22 ENCOUNTER — Other Ambulatory Visit (HOSPITAL_COMMUNITY): Payer: Self-pay | Admitting: Cardiology

## 2022-10-28 ENCOUNTER — Telehealth: Payer: Self-pay

## 2022-10-28 ENCOUNTER — Encounter: Payer: PRIVATE HEALTH INSURANCE | Admitting: Student

## 2022-10-28 NOTE — Telephone Encounter (Signed)
I spoke with [name] and she states her daughter might have put the monitor in the storage unit. She states she will try to send the transmission by Monday.

## 2022-11-01 ENCOUNTER — Ambulatory Visit (INDEPENDENT_AMBULATORY_CARE_PROVIDER_SITE_OTHER): Payer: Medicaid Other | Admitting: Student

## 2022-11-01 ENCOUNTER — Telehealth: Payer: PRIVATE HEALTH INSURANCE | Admitting: Internal Medicine

## 2022-11-01 ENCOUNTER — Telehealth: Payer: Self-pay | Admitting: Student

## 2022-11-01 ENCOUNTER — Other Ambulatory Visit: Payer: Self-pay | Admitting: Student

## 2022-11-01 DIAGNOSIS — E1142 Type 2 diabetes mellitus with diabetic polyneuropathy: Secondary | ICD-10-CM

## 2022-11-01 DIAGNOSIS — R051 Acute cough: Secondary | ICD-10-CM

## 2022-11-01 NOTE — Telephone Encounter (Signed)
Pt requesting a call back.  Pt coughing ,sneezing, and wheezing and wants to know what she can take, or what can be called in.

## 2022-11-01 NOTE — Telephone Encounter (Signed)
RTC to patient .  Message left that the Clinics had returned her call. 

## 2022-11-02 ENCOUNTER — Encounter: Payer: PRIVATE HEALTH INSURANCE | Admitting: Internal Medicine

## 2022-11-02 ENCOUNTER — Other Ambulatory Visit (HOSPITAL_COMMUNITY): Payer: Self-pay | Admitting: Cardiology

## 2022-11-02 DIAGNOSIS — R059 Cough, unspecified: Secondary | ICD-10-CM | POA: Insufficient documentation

## 2022-11-02 DIAGNOSIS — I10 Essential (primary) hypertension: Secondary | ICD-10-CM

## 2022-11-02 HISTORY — DX: Cough, unspecified: R05.9

## 2022-11-02 NOTE — Telephone Encounter (Signed)
Next appt scheduled 01/08/22 with PCP. °

## 2022-11-02 NOTE — Progress Notes (Signed)
[facility] Internal Medicine Residency Telephone Encounter Continuity Care Appointment  HPI:  This telephone encounter was created for [name] on 11/02/2022 for the following purpose/cc cough.   Past Medical History:  Past Medical History:  Diagnosis Date   Acute cystitis 06/19/2021   AKI (acute kidney injury) ([place]) 09/13/2022   Allergic rhinitis    Arthritis    "hips, back, legs, arms" (07/04/2014)   Asthma    hx   Automatic implantable cardioverter-defibrillator in situ    Calcifying tendinitis of shoulder    Chronic combined systolic and diastolic CHF (congestive heart failure) ([facility])    EF 40-45% by echo 12/06/2012   Chronic diastolic heart failure ([place])     Primarily diastolic CHF: Likely due to uncontrolled HTN. Last echo (8/12) with EF 45-50%, mild to moderate LVH with some asymmetric septal hypertrophy, RV normal size and systolic function. EF 50-55% by LV-gram in 6/12.    Chronic lower back pain    secondary to DJD, obsetiy, hip problems. Followed by Dr. Oval Linsey (pain management)   Chronic systolic heart failure ([place]) 05/17/2022   Chronic use of opiate for therapeutic purpose 12/14/2016   Contact with and (suspected) exposure to covid-19 02/07/2020   Coronary artery disease    questionable. [place] 05/2011 showing normal coronaries // Followed at [facility], Dr. Aundra Dubin   Degeneration of lumbar or lumbosacral intervertebral disc    DJD (degenerative joint disease) of hip    right sided   Fatigue 04/27/2022   Frequent UTI    GERD (gastroesophageal reflux disease)    HLD (hyperlipidemia)    Hypertension    Poorly controlled. Has had HTN since age 30. Angioedema with ACEI.  24 Hr urine and renal arterial dopplers ordered . . . Never done   LBBB (left bundle branch block)    Left shoulder pain 06/30/2015   Left spastic hemiparesis ([place]) 09/23/2015   Liver disease    Lumbago 11/22/2016   Morbid obesity ([place])    Muscle spasm 06/19/2021   Need for  pneumococcal vaccine 09/09/2017   NICM (nonischemic cardiomyopathy) ([place])    EF 45-50% in 8/12, cath 6/12 showed normal coronaries, EF 50-55% by LV gram   OSA on CPAP    sleep study in 8/12 showed moderate to severe OSA requiring CPAP   Perimenopausal 03/28/2017   Polyneuropathy in diabetes(357.2)    Presence of permanent cardiac pacemaker    Rash 07/14/2016   Seizures ([place])    last 3 months   Shortness of breath    none now   Stroke [facility]) 12/2013   "my left side is paralyzed" (07/04/2014)   Thoracic or lumbosacral neuritis or radiculitis, unspecified    Type II diabetes mellitus ([place]) DX: 2002   Urinary frequency 11/05/2014   Vaginal discharge 02/05/2021     ROS:  Cough, dyspnea, wheezing   Assessment / Plan / Recommendations:  Please see A&P under problem oriented charting for assessment of the patient's acute and chronic medical conditions.  As always, pt is advised that if symptoms worsen or new symptoms arise, they should go to an urgent care facility or to to ER for further evaluation.   Consent and Medical Decision Making:  Patient discussed with Dr.  Saverio Danker This is a telephone encounter between [name] and Iona Beard on 11/02/2022 for cough for the past 2 days. The visit was conducted with the patient located at home and Iona Beard at [facility]. The patient's identity was confirmed using  their DOB and current address. The patient has consented to being evaluated through a telephone encounter and understands the associated risks (an examination cannot be done and the patient may need to come in for an appointment) / benefits (allows the patient to remain at home, decreasing exposure to coronavirus). I personally spent 21 minutes on medical discussion.

## 2022-11-02 NOTE — Assessment & Plan Note (Signed)
Telehealth today for cough and shortness of breath for 2 days. Feels she is also wheezing.Was around friend who had a cold prior to this. Cough with clear sputum. Is eating and drinking well. No fever, chills, chest pain, dizziness, syncope, or swelling. Has not had her flu shot this year. May have a viral URI but given her other health conditions and shortenss of breath will have her come to clinic for further evaluation. She is agreeable, requested 2:15 clinic time. Scheduled with Dr. Ned Card.

## 2022-11-03 ENCOUNTER — Encounter: Payer: Self-pay | Admitting: Internal Medicine

## 2022-11-03 NOTE — Progress Notes (Signed)
No ICM remote transmission received for 10/25/2022 and next ICM transmission scheduled for 11/22/2022.   

## 2022-11-09 NOTE — Progress Notes (Signed)
Internal Medicine Clinic Attending ? ?Case discussed with Dr. Liang  At the time of the visit.  We reviewed the resident?s history and exam and pertinent patient test results.  I agree with the assessment, diagnosis, and plan of care documented in the resident?s note. ? ?

## 2022-11-09 NOTE — Addendum Note (Signed)
Addended by: Dickie La on: 11/09/2022 09:20 AM   Modules accepted: Level of Service

## 2022-11-12 ENCOUNTER — Other Ambulatory Visit: Payer: Self-pay

## 2022-11-12 MED ORDER — LINACLOTIDE 290 MCG PO CAPS: 290.0000 ug | ORAL_CAPSULE | Freq: Every day | ORAL | 2 refills | Status: DC | PRN

## 2022-11-12 NOTE — Addendum Note (Signed)
Addended by: Elza Rafter on: 11/12/2022 04:00 PM   Modules accepted: Orders

## 2022-11-12 NOTE — Addendum Note (Signed)
Addended by: Doral Digangi on: 11/12/2022 04:00 PM   Modules accepted: Orders  

## 2022-11-12 NOTE — Telephone Encounter (Signed)
LINZESS 290 MCG CAPS capsule, refill request @  [facility] [address], [place] - [address]

## 2022-11-15 MED ORDER — LINACLOTIDE 290 MCG PO CAPS: 290.0000 ug | ORAL_CAPSULE | Freq: Every day | ORAL | 2 refills | Status: DC | PRN

## 2022-11-15 NOTE — Telephone Encounter (Signed)
Linzees rx faxed to [facility]; confirmation received.

## 2022-11-15 NOTE — Telephone Encounter (Signed)
Please re-send electronically/normal. Check sig for unneeded spaces and or characters.  Thanks

## 2022-11-15 NOTE — Addendum Note (Signed)
Addended by: Hassan Buckler on: 11/15/2022 12:03 PM   Modules accepted: Orders

## 2022-11-17 ENCOUNTER — Other Ambulatory Visit: Payer: Self-pay | Admitting: Student

## 2022-11-18 ENCOUNTER — Encounter: Payer: Self-pay | Admitting: Internal Medicine

## 2022-11-18 ENCOUNTER — Other Ambulatory Visit (HOSPITAL_COMMUNITY): Payer: Self-pay | Admitting: Cardiology

## 2022-11-18 ENCOUNTER — Other Ambulatory Visit: Payer: Self-pay

## 2022-11-18 ENCOUNTER — Ambulatory Visit (INDEPENDENT_AMBULATORY_CARE_PROVIDER_SITE_OTHER): Payer: Medicaid Other | Admitting: Internal Medicine

## 2022-11-18 VITALS — BP 147/85 | HR 89 | Temp 98.1°F | Ht 69.0 in | Wt 225.2 lb

## 2022-11-18 DIAGNOSIS — I672 Cerebral atherosclerosis: Secondary | ICD-10-CM | POA: Diagnosis not present

## 2022-11-18 DIAGNOSIS — N3946 Mixed incontinence: Secondary | ICD-10-CM | POA: Diagnosis not present

## 2022-11-18 DIAGNOSIS — E1140 Type 2 diabetes mellitus with diabetic neuropathy, unspecified: Secondary | ICD-10-CM | POA: Diagnosis not present

## 2022-11-18 DIAGNOSIS — I110 Hypertensive heart disease with heart failure: Secondary | ICD-10-CM | POA: Diagnosis not present

## 2022-11-18 DIAGNOSIS — E1165 Type 2 diabetes mellitus with hyperglycemia: Secondary | ICD-10-CM

## 2022-11-18 DIAGNOSIS — Z0000 Encounter for general adult medical examination without abnormal findings: Secondary | ICD-10-CM

## 2022-11-18 DIAGNOSIS — Z794 Long term (current) use of insulin: Secondary | ICD-10-CM

## 2022-11-18 DIAGNOSIS — Z23 Encounter for immunization: Secondary | ICD-10-CM

## 2022-11-18 DIAGNOSIS — I5042 Chronic combined systolic (congestive) and diastolic (congestive) heart failure: Secondary | ICD-10-CM

## 2022-11-18 DIAGNOSIS — E559 Vitamin D deficiency, unspecified: Secondary | ICD-10-CM

## 2022-11-18 DIAGNOSIS — F331 Major depressive disorder, recurrent, moderate: Secondary | ICD-10-CM | POA: Diagnosis not present

## 2022-11-18 DIAGNOSIS — Z139 Encounter for screening, unspecified: Secondary | ICD-10-CM

## 2022-11-18 DIAGNOSIS — I10 Essential (primary) hypertension: Secondary | ICD-10-CM

## 2022-11-18 DIAGNOSIS — Z59819 Housing instability, housed unspecified: Secondary | ICD-10-CM | POA: Insufficient documentation

## 2022-11-18 DIAGNOSIS — Z8673 Personal history of transient ischemic attack (TIA), and cerebral infarction without residual deficits: Secondary | ICD-10-CM

## 2022-11-18 DIAGNOSIS — E785 Hyperlipidemia, unspecified: Secondary | ICD-10-CM | POA: Diagnosis not present

## 2022-11-18 MED ORDER — ATORVASTATIN CALCIUM 40 MG PO TABS: 40.0000 mg | ORAL_TABLET | Freq: Every day | ORAL | 2 refills | Status: DC

## 2022-11-18 MED ORDER — METOPROLOL SUCCINATE ER 100 MG PO TB24: ORAL_TABLET | ORAL | 0 refills | Status: DC

## 2022-11-18 MED ORDER — ESCITALOPRAM OXALATE 10 MG PO TABS: 10.0000 mg | ORAL_TABLET | Freq: Every day | ORAL | 1 refills | Status: DC

## 2022-11-18 MED ORDER — TRULICITY 0.75 MG/0.5ML ~~LOC~~ SOAJ: 0.7500 mg | SUBCUTANEOUS | 1 refills | Status: DC

## 2022-11-18 MED ORDER — HYDRALAZINE HCL 25 MG PO TABS: 25.0000 mg | ORAL_TABLET | Freq: Three times a day (TID) | ORAL | 3 refills | Status: DC

## 2022-11-18 MED ORDER — ALBUTEROL SULFATE HFA 108 (90 BASE) MCG/ACT IN AERS: 2.0000 | INHALATION_SPRAY | Freq: Four times a day (QID) | RESPIRATORY_TRACT | 2 refills | Status: DC | PRN

## 2022-11-18 MED ORDER — "PEN NEEDLES 5/16"" 31G X 8 MM MISC": 1.0000 | Freq: Four times a day (QID) | 1 refills | Status: DC

## 2022-11-18 MED ORDER — LANTUS SOLOSTAR 100 UNIT/ML ~~LOC~~ SOPN: 25.0000 [IU] | PEN_INJECTOR | Freq: Every day | SUBCUTANEOUS | 0 refills | Status: DC

## 2022-11-18 MED ORDER — SPIRONOLACTONE 100 MG PO TABS: 100.0000 mg | ORAL_TABLET | Freq: Every day | ORAL | 2 refills | Status: DC

## 2022-11-18 MED ORDER — AMLODIPINE BESYLATE 10 MG PO TABS: 10.0000 mg | ORAL_TABLET | Freq: Every day | ORAL | 3 refills | Status: DC

## 2022-11-18 NOTE — Progress Notes (Signed)
CC: med refill  HPI:  [name] is a 56 y.o. female living with a history stated below and presents today for a follow up and medication refills. Please see problem based assessment and plan for additional details.  Past Medical History:  Diagnosis Date   Acute cystitis 06/19/2021   AKI (acute kidney injury) ([place]) 09/13/2022   Allergic rhinitis    Arthritis    "hips, back, legs, arms" (07/04/2014)   Asthma    hx   Automatic implantable cardioverter-defibrillator in situ    Calcifying tendinitis of shoulder    Chronic combined systolic and diastolic CHF (congestive heart failure) ([place])    EF 40-45% by echo 12/06/2012   Chronic diastolic heart failure ([place])     Primarily diastolic CHF: Likely due to uncontrolled HTN. Last echo (8/12) with EF 45-50%, mild to moderate LVH with some asymmetric septal hypertrophy, RV normal size and systolic function. EF 50-55% by LV-gram in 6/12.    Chronic lower back pain    secondary to DJD, obsetiy, hip problems. Followed by Dr. Oval Linsey (pain management)   Chronic systolic heart failure ([place]) 05/17/2022   Chronic use of opiate for therapeutic purpose 12/14/2016   Contact with and (suspected) exposure to covid-19 02/07/2020   Coronary artery disease    questionable. [place] 05/2011 showing normal coronaries // Followed at [facility], Dr. Aundra Dubin   Degeneration of lumbar or lumbosacral intervertebral disc    DJD (degenerative joint disease) of hip    right sided   Fatigue 04/27/2022   Frequent UTI    GERD (gastroesophageal reflux disease)    HLD (hyperlipidemia)    Hypertension    Poorly controlled. Has had HTN since age 80. Angioedema with ACEI.  24 Hr urine and renal arterial dopplers ordered . . . Never done   LBBB (left bundle branch block)    Left shoulder pain 06/30/2015   Left spastic hemiparesis ([place]) 09/23/2015   Liver disease    Lumbago 11/22/2016   Morbid obesity ([place])    Muscle spasm 06/19/2021   Need for  pneumococcal vaccine 09/09/2017   NICM (nonischemic cardiomyopathy) ([place])    EF 45-50% in 8/12, cath 6/12 showed normal coronaries, EF 50-55% by LV gram   OSA on CPAP    sleep study in 8/12 showed moderate to severe OSA requiring CPAP   Perimenopausal 03/28/2017   Polyneuropathy in diabetes(357.2)    Presence of permanent cardiac pacemaker    Rash 07/14/2016   Seizures ([place])    last 3 months   Shortness of breath    none now   Stroke [facility]) 12/2013   "my left side is paralyzed" (07/04/2014)   Thoracic or lumbosacral neuritis or radiculitis, unspecified    Type II diabetes mellitus ([place]) DX: 2002   Urinary frequency 11/05/2014   Vaginal discharge 02/05/2021    Current Outpatient Medications on File Prior to Visit  Medication Sig Dispense Refill   Accu-Chek Softclix Lancets lancets Use As Directed 100 each 0   Blood Glucose Monitoring Suppl (ACCU-CHEK GUIDE) w/Device KIT Use As Directed 1 kit 0   cyanocobalamin 1000 MCG tablet Take 1 tablet by mouth daily.     cyclobenzaprine (FLEXERIL) 5 MG tablet TAKE ONE TABLET BY MOUTH THREE TIMES DAILY AS NEEDED FOR MUSCLE SPASMS (Patient taking differently: Take 5 mg by mouth 3 (three) times daily as needed for muscle spasms.) 30 tablet 1   diclofenac Sodium (VOLTAREN) 1 % GEL APPLY 4 GRAMS TOPICALLY TO LEFT KNEE  FOUR TIMES DAILY. (Patient taking differently: Apply 4 g topically 4 (four) times daily. Left knee) 300 g 2   fluticasone (CUTIVATE) 0.05 % cream APPLY TO THE AFFECTED AREA(S) TWICE DAILY (Patient taking differently: Apply 1 Application topically 2 (two) times daily.) 30 g 6   folic acid (FOLVITE) 1 MG tablet TAKE ONE TABLET BY MOUTH EVERY DAY (Patient taking differently: Take 1 mg by mouth daily.) 90 tablet 1   furosemide (LASIX) 40 MG tablet TAKE ONE TABLET BY MOUTH DAILY (Patient taking differently: Take 40 mg by mouth 2 (two) times a week.) 90 tablet 3   gabapentin (NEURONTIN) 300 MG capsule Take 1 capsule (300 mg total) by mouth 2 (two)  times daily. 60 capsule 3   glucose blood test strip Use As Directed 100 each 0   isosorbide mononitrate (IMDUR) 60 MG 24 hr tablet TAKE ONE AND ONE-HALF TABLETS BY MOUTH EVERY DAY (Patient taking differently: Take 90 mg by mouth daily.) 135 tablet 2   KLOR-CON M20 20 MEQ tablet TAKE ONE TABLET BY MOUTH DAILY 90 tablet 3   levETIRAcetam (KEPPRA) 500 MG tablet Take 1 tablet (500 mg total) by mouth 2 (two) times daily. 180 tablet 3   linaclotide (LINZESS) 290 MCG CAPS capsule Take 1 capsule (290 mcg total) by mouth daily as needed (Constipation). TAKE 1 CAPSULE (290 MCG TOTAL) BY MOUTH DAILY AS NEEDED (CONSTIPATION). Strength: 290 mcg 30 capsule 2   liver oil-zinc oxide (DESITIN) 40 % ointment Apply topically every 4 (four) hours. 56.7 g 0   pantoprazole (PROTONIX) 40 MG tablet TAKE ONE TABLET BY MOUTH EVERY DAY (Patient taking differently: Take 40 mg by mouth daily.) 90 tablet 3   rivaroxaban (XARELTO) 20 MG TABS tablet Take 1 tablet (20 mg total) by mouth daily with supper. 30 tablet 2   STOOL SOFTENER/LAXATIVE 50-8.6 MG tablet TAKE ONE TABLET BY MOUTH AT BEDTIME AS NEEDED FOR MILD CONSTIPATION (Patient taking differently: Take 1 tablet by mouth at bedtime as needed for mild constipation.) 30 tablet 0   Vitamin D, Ergocalciferol, (DRISDOL) 1.25 MG (50000 UNIT) CAPS capsule Take 50,000 Units by mouth every 7 (seven) days.     No current facility-administered medications on file prior to visit.    Family History  Problem Relation Age of Onset   Heart disease Mother 52       Died of MI at age 103 yo   Kidney disease Mother        requiring dialysis   Congestive Heart Failure Mother    Heart disease Father 45       MI age 77yo requiring stenting   Diabetes Father    Glaucoma Father    Heart disease Paternal Grandmother        requiring pacemaker.   Heart disease Paternal Grandfather 61       Died of MI at possibly age 55-53yo   Stroke Paternal Grandfather    Diabetes Brother    Heart  disease Brother 69       MI at age 53 years old   Breast cancer Paternal Aunt    Breast cancer Maternal Grandmother     Social History   Socioeconomic History   Marital status: Single    Spouse name: Not on file   Number of children: 2   Years of education: 9th grade   Highest education level: Not on file  Occupational History   Occupation: Unemployed    Comment: planning on getting disability  Tobacco Use  Smoking status: Never   Smokeless tobacco: Never  Vaping Use   Vaping Use: Never used  Substance and Sexual Activity   Alcohol use: No    Alcohol/week: 0.0 standard drinks of alcohol   Drug use: No   Sexual activity: Not on file  Other Topics Concern   Not on file  Social History Narrative   Lives in [place] with her son. Is able to read and write fluently in [place].   Social Determinants of Health   Financial Resource Strain: Unknown (11/18/2022)   Overall Financial Resource Strain (CARDIA)    Difficulty of Paying Living Expenses: Patient refused  Food Insecurity: Food Insecurity Present (11/18/2022)   Hunger Vital Sign    Worried About Running Out of Food in the Last Year: Often true    Ran Out of Food in the Last Year: Often true  Transportation Needs: Unmet Transportation Needs (11/18/2022)   PRAPARE - Hydrologist (Medical): Yes    Lack of Transportation (Non-Medical): Yes  Physical Activity: Not on file  Stress: Not on file  Social Connections: Not on file  Intimate Partner Violence: Not At Risk (09/13/2022)   Humiliation, Afraid, Rape, and Kick questionnaire    Fear of Current or Ex-Partner: No    Emotionally Abused: No    Physically Abused: No    Sexually Abused: No    Review of Systems: ROS negative except for what is noted on the assessment and plan.  Vitals:   11/18/22 1515 11/18/22 1551  BP: (!) 140/89 (!) 147/85  Pulse: 90 89  Temp: 98.1 F (36.7 C)   TempSrc: Oral   SpO2: 100% 99%  Weight: 225 lb 3.2 oz  (102.2 kg)   Height: _0  (1.753 m)     Physical Exam: Constitutional: well-appearing female sitting in wheelchair, in no acute distress Cardiovascular: regular rate and rhythm, no m/r/g Pulmonary/Chest: normal work of breathing on room air, lungs clear to auscultation bilaterally Abdominal: soft, non-tender, non-distended GU: Suprapubic cath  MSK: normal bulk and tone Neurological: alert & oriented x 3, no focal deficit Skin: warm and dry Psych: normal mood and behavior  Assessment & Plan:    Patient discussed with Dr. Philipp Ovens  Chronic combined systolic and diastolic heart failure ([place]) The patient follows with Dr. Aundra Dubin the advanced heart failure team.  Last EF was 25 to 30%.  She is on spironolactone 100 mg daily, metoprolol 100 mg daily, hydralazine 25 mg 3 times daily, Lasix 40 mg twice a week, amlodipine 10 mg daily, and Imdur 90 mg daily.  I have placed refills for all of her medications today and the patient is going to make an appointment to follow-up with Dr. Aundra Dubin.  Poorly controlled type 2 diabetes mellitus with neuropathy (HCC) Last A1c was 13.3% in October.  The patient is on Lantus 25 units daily, and was also supposed to be on Trulicity 3 mg/week, but she has not taken this medication in over 2 months.  The patient is not due for an A1c recheck at this time, but we will restart her on Trulicity.  Plan: -Restart Trulicity 9.82 mg/week -Continue Lantus 25 units daily -Continue to check blood sugars at least once a day -Diabetic foot exam performed today -DME order placed for left leg brace and diabetic shoes, as patient needs these due to her neuropathy -Urine micro today -Patient is going to make an appointment with her ophthalmologist -Referral to podiatry -Follow-up in 1 month for A1c recheck  Health care maintenance - Flu shot given today  Mixed stress and urge urinary incontinence The patient has dealt with stress and urge urinary incontinence since her  cerebral infarction 8 years ago.  She has had complete loss of bladder control, and previously was utilizing adult pull-ups and disposable under pads, however she has been following with urology and they placed a urinary catheter 2 months ago.  She is going to continue to follow-up with urology, as she is unsure how long she will have this catheter in place.  I have placed a face-to-face order in order for the patient to obtain more hours with personal care services, as she is unable to complete her ADLs and has had a clinical decline, to the point that she now requires a urinary catheter.  Hyperlipidemia The patient has been taking atorvastatin 40 mg daily for hyperlipidemia and previous cerebral infarction.  Goal LDL is less than 70, but has not been checked in over a year.  Plan: -Lipid profile today -Continue atorvastatin 40 mg daily   Encounter for screening involving social determinants of health (SDoH) Patient states that she has food insecurity, housing insecurity, difficulties with transportation.  Referral placed to social worker, Milus Height.   Vitamin D deficiency Patient has a history of vitamin D deficiency and was previously on vitamin D supplementation, but has been off of this for a while.  Will recheck vitamin D level today and send in supplementation if needed.  Depression The patient has a long history of depression previously followed with Dr. Carolynne Edouard.  PHQ-9 score is 10, and the patient rates that her depression makes it somewhat difficult for her to do her work and take care of things at home.  We did not have time to address this today, thus would recommend doing so at the next office visit.  I did refill her Lexapro 10 mg daily.   Buddy Duty, D.O. [facility], PGY-2 Phone: [phone] Date 11/18/2022 Time 4:38 PM

## 2022-11-18 NOTE — Assessment & Plan Note (Signed)
Patient states that she has food insecurity, housing insecurity, difficulties with transportation.  Referral placed to social worker, Christen Butter.

## 2022-11-18 NOTE — Patient Instructions (Signed)
Thank you, [name] for allowing [place] to provide your care today. Today we discussed:  Heart/high blood pressure:  Please take amlodipine 10 mg daily Take spironolactone 100 mg daily Take metoprolol 100 mg daily Take hydralazine 25 mg three times a day Continue to take lasix 40 mg twice a week Make a follow up appt with Dr. Shirlee Latch Diabetes Restart trulicity. Inject 0.75 mg once a week Keep using lantus 25 units each day Make an appointment with [name] Make an appointment with your eye doctor We are checking your cholesterol and vitamin D today Flu shot today  I am trying to help you get more hours for your personal care services  I have ordered the following labs for you:   Lab Orders         Microalbumin / Creatinine Urine Ratio         Vitamin D (25 hydroxy)         Lipid Profile      Referrals ordered today:   Referral Orders  No referral(s) requested today     I have ordered the following medication/changed the following medications:   Stop the following medications: Medications Discontinued During This Encounter  Medication Reason   Dulaglutide (TRULICITY) 3 MG/0.5ML SOPN    atorvastatin (LIPITOR) 40 MG tablet Reorder   spironolactone (ALDACTONE) 100 MG tablet Reorder   Insulin Pen Needle (PEN NEEDLES 31GX5/16") 31G X 8 MM MISC Reorder   insulin glargine (LANTUS SOLOSTAR) 100 UNIT/ML Solostar Pen Reorder   escitalopram (LEXAPRO) 10 MG tablet Reorder   hydrALAZINE (APRESOLINE) 25 MG tablet Reorder   amLODipine (NORVASC) 10 MG tablet Reorder   metoprolol succinate (TOPROL-XL) 100 MG 24 hr tablet Reorder     Start the following medications: Meds ordered this encounter  Medications   amLODipine (NORVASC) 10 MG tablet    Sig: Take 1 tablet (10 mg total) by mouth daily.    Dispense:  90 tablet    Refill:  3   atorvastatin (LIPITOR) 40 MG tablet    Sig: Take 1 tablet (40 mg total) by mouth daily.    Dispense:  90 tablet    Refill:  2   insulin glargine  (LANTUS SOLOSTAR) 100 UNIT/ML Solostar Pen    Sig: Inject 25 Units into the skin daily.    Dispense:  15 mL    Refill:  0   Insulin Pen Needle (PEN NEEDLES 31GX5/16") 31G X 8 MM MISC    Sig: Use to inject insulin in the morning, at noon, in the evening, and at bedtime.    Dispense:  100 each    Refill:  1   hydrALAZINE (APRESOLINE) 25 MG tablet    Sig: Take 1 tablet (25 mg total) by mouth 3 (three) times daily.    Dispense:  90 tablet    Refill:  3   metoprolol succinate (TOPROL-XL) 100 MG 24 hr tablet    Sig: TAKE ONE TABLET BY MOUTH EVERY DAY. LAST REFILL UNTIL OFFICE VISIT, CALL [phone] TO SCHEDULE    Dispense:  30 tablet    Refill:  0   spironolactone (ALDACTONE) 100 MG tablet    Sig: Take 1 tablet (100 mg total) by mouth daily.    Dispense:  30 tablet    Refill:  2   Dulaglutide (TRULICITY) 0.75 MG/0.5ML SOPN    Sig: Inject 0.75 mg into the skin once a week.    Dispense:  3 mL    Refill:  1  escitalopram (LEXAPRO) 10 MG tablet    Sig: Take 1 tablet (10 mg total) by mouth daily.    Dispense:  60 tablet    Refill:  1    This prescription was filled on 09/01/2022. Any refills authorized will be placed on file.   albuterol (VENTOLIN HFA) 108 (90 Base) MCG/ACT inhaler    Sig: Inhale 2 puffs into the lungs every 6 (six) hours as needed for wheezing or shortness of breath.    Dispense:  8 g    Refill:  2     Follow up:  1 month for diabetes     Should you have any questions or concerns please call the internal medicine clinic at [phone].     Elza Rafter, D.O. [facility]

## 2022-11-18 NOTE — Assessment & Plan Note (Addendum)
Last A1c was 13.3% in October.  The patient is on Lantus 25 units daily, and was also supposed to be on Trulicity 3 mg/week, but she has not taken this medication in over 2 months.  The patient is not due for an A1c recheck at this time, but we will restart her on Trulicity.  Plan: -Restart Trulicity 0.75 mg/week -Continue Lantus 25 units daily -Continue to check blood sugars at least once a day -Diabetic foot exam performed today -DME order placed for left leg brace and diabetic shoes, as patient needs these due to her neuropathy -Urine micro today -Patient is going to make an appointment with her ophthalmologist -Referral to podiatry -Follow-up in 1 month for A1c recheck

## 2022-11-18 NOTE — Assessment & Plan Note (Signed)
Flu shot given today

## 2022-11-18 NOTE — Assessment & Plan Note (Signed)
The patient has a long history of depression previously followed with Dr. Sallyanne Kuster.  PHQ-9 score is 10, and the patient rates that her depression makes it somewhat difficult for her to do her work and take care of things at home.  We did not have time to address this today, thus would recommend doing so at the next office visit.  I did refill her Lexapro 10 mg daily.

## 2022-11-18 NOTE — Assessment & Plan Note (Signed)
The patient has been taking atorvastatin 40 mg daily for hyperlipidemia and previous cerebral infarction.  Goal LDL is less than 70, but has not been checked in over a year.  Plan: -Lipid profile today -Continue atorvastatin 40 mg daily

## 2022-11-18 NOTE — Assessment & Plan Note (Signed)
The patient follows with Dr. Shirlee Latch the advanced heart failure team.  Last EF was 25 to 30%.  She is on spironolactone 100 mg daily, metoprolol 100 mg daily, hydralazine 25 mg 3 times daily, Lasix 40 mg twice a week, amlodipine 10 mg daily, and Imdur 90 mg daily.  I have placed refills for all of her medications today and the patient is going to make an appointment to follow-up with Dr. Shirlee Latch.

## 2022-11-18 NOTE — Assessment & Plan Note (Addendum)
The patient has dealt with stress and urge urinary incontinence since her cerebral infarction 8 years ago.  She has had complete loss of bladder control, and previously was utilizing adult pull-ups and disposable under pads, however she has been following with urology and they placed a urinary catheter 2 months ago.  She is going to continue to follow-up with urology, as she is unsure how long she will have this catheter in place.  I have placed a face-to-face order in order for the patient to obtain more hours with personal care services, as she is unable to complete her ADLs and has had a clinical decline, to the point that she now requires a urinary catheter.

## 2022-11-18 NOTE — Assessment & Plan Note (Signed)
Patient has a history of vitamin D deficiency and was previously on vitamin D supplementation, but has been off of this for a while.  Will recheck vitamin D level today and send in supplementation if needed.

## 2022-11-19 ENCOUNTER — Telehealth: Payer: Self-pay | Admitting: *Deleted

## 2022-11-19 LAB — LIPID PANEL
Chol/HDL Ratio: 3.7 ratio (ref 0.0–4.4)
Cholesterol, Total: 177 mg/dL (ref 100–199)
HDL: 48 mg/dL (ref >39)
LDL Chol Calc (NIH): 97 mg/dL (ref 0–99)
Triglycerides: 188 mg/dL — ABNORMAL HIGH (ref 0–149)
VLDL Cholesterol Cal: 32 mg/dL (ref 5–40)

## 2022-11-19 LAB — MICROALBUMIN / CREATININE URINE RATIO
Creatinine, Urine: 73.6 mg/dL
Microalb/Creat Ratio: 76 mg/g creat — ABNORMAL HIGH (ref 0–29)
Microalbumin, Urine: 55.7 ug/mL

## 2022-11-19 LAB — VITAMIN D 25 HYDROXY (VIT D DEFICIENCY, FRACTURES): Vit D, 25-Hydroxy: 14.7 ng/mL — ABNORMAL LOW (ref 30.0–100.0)

## 2022-11-19 NOTE — Progress Notes (Signed)
  Care Coordination   Note   11/19/2022 Name: GLADIS SOLEY MRN: [id number] DOB: [birth year]  ALIZEA PELL is a 56 y.o. year old female who sees Atway, Rayann N, DO for primary care. I reached out to [name] by phone today to offer care coordination services.  Ms. Boberg was given information about Care Coordination services today including:   The Care Coordination services include support from the care team which includes your Nurse Coordinator, Clinical Social Worker, or Pharmacist.  The Care Coordination team is here to help remove barriers to the health concerns and goals most important to you. Care Coordination services are voluntary, and the patient may decline or stop services at any time by request to their care team member.   Care Coordination Consent Status: Patient agreed to services and verbal consent obtained.   Follow up plan:  Telephone appointment with care coordination team member scheduled for:  11/24/22  Encounter Outcome:  Pt. Scheduled  [facility] Coordination Care Guide  Direct Dial: [phone]

## 2022-11-22 ENCOUNTER — Telehealth: Payer: Self-pay | Admitting: *Deleted

## 2022-11-22 MED ORDER — VITAMIN D (ERGOCALCIFEROL) 1.25 MG (50000 UNIT) PO CAPS: 50000.0000 [IU] | ORAL_CAPSULE | ORAL | 1 refills | Status: DC

## 2022-11-22 NOTE — Progress Notes (Signed)
Vitamin D level low at 14.7. Will send in supplementation. Additionally, LDL improved from 112 to 97 now, although still above goal of <70. Will increase atorvastatin to 80 mg daily (advised patient to take two 40 mg tablets until rx runs out, then can send in higher dose).

## 2022-11-22 NOTE — Progress Notes (Signed)
Internal Medicine Clinic Attending ° °Case discussed with Dr. Atway  At the time of the visit.  We reviewed the resident’s history and exam and pertinent patient test results.  I agree with the assessment, diagnosis, and plan of care documented in the resident’s note.  °

## 2022-11-22 NOTE — Addendum Note (Signed)
Addended by: Elza Rafter on: 11/22/2022 11:17 AM   Modules accepted: Orders

## 2022-11-22 NOTE — Telephone Encounter (Signed)
Information was faxed to [place]. Medicaid for [place] for Trulicity.

## 2022-11-24 ENCOUNTER — Telehealth: Payer: Self-pay

## 2022-11-24 ENCOUNTER — Ambulatory Visit: Payer: Self-pay | Admitting: Licensed Clinical Social Worker

## 2022-11-24 NOTE — Patient Outreach (Signed)
  Care Coordination   11/24/2022 Name: [name] MRN: [id number] DOB: [birth year]   Care Coordination Outreach Attempts:  An unsuccessful telephone outreach was attempted today to offer the patient information about available care coordination services as a benefit of their health plan.   Follow Up Plan:  No further outreach attempts will be made at this time. We have been unable to contact the patient to offer or enroll patient in care coordination services  Encounter Outcome:  No Answer   Care Coordination Interventions:  No, not indicated    Christen Butter, BSW MSW, LCSW-A Social Worker [facility]  [phone]

## 2022-11-24 NOTE — Telephone Encounter (Signed)
Spoke with patient regarding home remote transmissions.  She stated her monitor is not working.  Provided [employer] number and advised to call to get assistance with monitor or getting a new monitor.  She said she would call today.  ICM Remote transmission rescheduled for 01/03/2023.

## 2022-12-06 NOTE — Addendum Note (Signed)
Addended by: Elza Rafter on: 12/06/2022 12:44 PM   Modules accepted: Orders

## 2022-12-17 ENCOUNTER — Other Ambulatory Visit: Payer: Self-pay | Admitting: Internal Medicine

## 2022-12-31 ENCOUNTER — Other Ambulatory Visit: Payer: Self-pay | Admitting: Student

## 2022-12-31 NOTE — Telephone Encounter (Signed)
Next appt scheduled 01/24/23 with Dr Alfonse Spruce.

## 2023-01-03 ENCOUNTER — Other Ambulatory Visit (HOSPITAL_COMMUNITY): Payer: Self-pay

## 2023-01-03 ENCOUNTER — Ambulatory Visit: Payer: PRIVATE HEALTH INSURANCE | Attending: Internal Medicine

## 2023-01-03 DIAGNOSIS — I5042 Chronic combined systolic (congestive) and diastolic (congestive) heart failure: Secondary | ICD-10-CM

## 2023-01-03 DIAGNOSIS — Z95810 Presence of automatic (implantable) cardiac defibrillator: Secondary | ICD-10-CM | POA: Diagnosis not present

## 2023-01-04 ENCOUNTER — Ambulatory Visit: Payer: PRIVATE HEALTH INSURANCE | Attending: Internal Medicine

## 2023-01-04 DIAGNOSIS — I447 Left bundle-branch block, unspecified: Secondary | ICD-10-CM

## 2023-01-04 LAB — CUP PACEART REMOTE DEVICE CHECK
Date Time Interrogation Session: 20240123142834
Implantable Lead Connection Status: 753985
Implantable Lead Connection Status: 753985
Implantable Lead Connection Status: 753985
Implantable Lead Implant Date: 20140910
Implantable Lead Implant Date: 20140910
Implantable Lead Implant Date: 20140910
Implantable Lead Location: 753858
Implantable Lead Location: 753859
Implantable Lead Location: 753860
Implantable Lead Model: 4298
Implantable Lead Model: 5076
Implantable Lead Model: 6935
Implantable Pulse Generator Implant Date: 20220406

## 2023-01-05 ENCOUNTER — Other Ambulatory Visit: Payer: Self-pay

## 2023-01-05 ENCOUNTER — Other Ambulatory Visit: Payer: Self-pay | Admitting: Student

## 2023-01-05 DIAGNOSIS — I69359 Hemiplegia and hemiparesis following cerebral infarction affecting unspecified side: Secondary | ICD-10-CM

## 2023-01-05 DIAGNOSIS — I4891 Unspecified atrial fibrillation: Secondary | ICD-10-CM

## 2023-01-05 DIAGNOSIS — R32 Unspecified urinary incontinence: Secondary | ICD-10-CM

## 2023-01-07 ENCOUNTER — Telehealth: Payer: Self-pay

## 2023-01-07 ENCOUNTER — Other Ambulatory Visit: Payer: Self-pay | Admitting: Internal Medicine

## 2023-01-07 NOTE — Telephone Encounter (Signed)
Remote ICM transmission received.  Attempted call to patient regarding ICM remote transmission and message to return call. 

## 2023-01-07 NOTE — Progress Notes (Signed)
EPIC Encounter for ICM Monitoring  Patient Name: [name] is a 57 y.o. female Date: 01/07/2023 Primary Care Physican: Dorethea Clan, DO Primary Cardiologist: Aundra Dubin Electrophysiologist: Vergie Living Pacing: 96.7%         06/01/2022 Weight: 237 lbs  06/07/2022 Weight: 237 lbs   Time in AT/AF  <0.1 hr/day (<0.1%)         Attempted call to patient and unable to reach.  Left message to return call. Transmission reviewed.    Optivol thoracic impedance possible fluid accumulation starting 09/2022 and returned to baseline 01/06/23.   Prescribed: Furosemide 40 mg take 1 tablet by mouth daily Potassium 20 mEq take 1 tablet by mouth daily. Spironolactone 100 mg take 1 tablet by mouth daily.   Labs: 09/15/2022 Creatinine 1.07, BUN 8, Potassium 3.5, Sodium 137, GFR >60 09/14/2022 Creatinine 1.25, BUN 12, Potassium 3.5, Sodium 134, GFR 51  09/13/2022 Creatinine 1.28, BUN 12, Potassium 4.0, Sodium 136, GFR 49  09/12/2022 Creatinine 1.40, BUN 15, Potassium 3.6, Sodium 132, GFR 44  04/27/2022 Creatinine 1.09, BUN 13, Potassium 4.9, Sodium 130, GFR 60 A complete set of results can be found in Results Review.   Recommendations:  Unable to reach.     Follow-up plan: ICM clinic phone appointment on 01/24/2023 to recheck fluid levels.   91 day device clinic remote transmission 04/05/2023.     EP/Cardiology Office Visits:  Canceled 06/25/2022 appt with Dr Caryl Comes.  Missed 07/12/2022 with HF clinic.   Copy of ICM check sent to Dr. Caryl Comes.     3 month ICM trend: 01/04/2023.    12-14 Month ICM trend:     Rosalene Billings, RN 01/07/2023 1:50 PM

## 2023-01-07 NOTE — Telephone Encounter (Signed)
Remote ICM transmission received.  Attempted call to patient regarding ICM remote transmission and left message to return call. ? ?

## 2023-01-11 ENCOUNTER — Other Ambulatory Visit: Payer: Self-pay | Admitting: Internal Medicine

## 2023-01-14 ENCOUNTER — Other Ambulatory Visit: Payer: Self-pay | Admitting: Student

## 2023-01-17 NOTE — Telephone Encounter (Signed)
Patient already has an appointment scheduled on 01/24/2023 at 3:15 pm with Dr. Alfonse Spruce.

## 2023-01-19 ENCOUNTER — Ambulatory Visit (HOSPITAL_COMMUNITY): Payer: Medicaid Other | Admitting: Physician Assistant

## 2023-01-20 ENCOUNTER — Other Ambulatory Visit: Payer: Self-pay | Admitting: Internal Medicine

## 2023-01-20 ENCOUNTER — Ambulatory Visit (INDEPENDENT_AMBULATORY_CARE_PROVIDER_SITE_OTHER): Payer: Medicaid Other | Admitting: Physician Assistant

## 2023-01-20 ENCOUNTER — Telehealth: Payer: Self-pay | Admitting: Internal Medicine

## 2023-01-20 ENCOUNTER — Other Ambulatory Visit: Payer: Self-pay | Admitting: Student

## 2023-01-20 ENCOUNTER — Encounter (HOSPITAL_COMMUNITY): Payer: Self-pay | Admitting: Physician Assistant

## 2023-01-20 DIAGNOSIS — F331 Major depressive disorder, recurrent, moderate: Secondary | ICD-10-CM

## 2023-01-20 DIAGNOSIS — F4321 Adjustment disorder with depressed mood: Secondary | ICD-10-CM

## 2023-01-20 DIAGNOSIS — I639 Cerebral infarction, unspecified: Secondary | ICD-10-CM

## 2023-01-20 DIAGNOSIS — F411 Generalized anxiety disorder: Secondary | ICD-10-CM

## 2023-01-20 DIAGNOSIS — F5105 Insomnia due to other mental disorder: Secondary | ICD-10-CM

## 2023-01-20 DIAGNOSIS — F4310 Post-traumatic stress disorder, unspecified: Secondary | ICD-10-CM

## 2023-01-20 DIAGNOSIS — F99 Mental disorder, not otherwise specified: Secondary | ICD-10-CM

## 2023-01-20 MED ORDER — BUSPIRONE HCL 7.5 MG PO TABS: 7.5000 mg | ORAL_TABLET | Freq: Two times a day (BID) | ORAL | 1 refills | Status: DC

## 2023-01-20 MED ORDER — TRAZODONE HCL 50 MG PO TABS: 50.0000 mg | ORAL_TABLET | Freq: Every day | ORAL | 1 refills | Status: DC

## 2023-01-20 MED ORDER — ESCITALOPRAM OXALATE 10 MG PO TABS: 10.0000 mg | ORAL_TABLET | Freq: Every day | ORAL | 1 refills | Status: DC
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
## 2023-01-20 NOTE — Telephone Encounter (Signed)
Referral placed to DME for shower chair.

## 2023-01-20 NOTE — Telephone Encounter (Addendum)
Pt is requesting a Civil engineer, contracting and she is having a difficult time when bathing.  Pt states she discussed a need for this at her last visit 11/19/2023 and this is only DME equipment she is still waiting on.  Pt states she has rec'd her Incontinence Supplies and has already sch for an  appt with the [facility] for the Diabetic Shoes and Brace.

## 2023-01-24 ENCOUNTER — Encounter: Payer: PRIVATE HEALTH INSURANCE | Admitting: Student

## 2023-01-24 ENCOUNTER — Ambulatory Visit: Payer: PRIVATE HEALTH INSURANCE

## 2023-01-27 ENCOUNTER — Ambulatory Visit (INDEPENDENT_AMBULATORY_CARE_PROVIDER_SITE_OTHER): Payer: Medicaid Other | Admitting: Podiatry

## 2023-01-27 DIAGNOSIS — M79675 Pain in left toe(s): Secondary | ICD-10-CM | POA: Diagnosis not present

## 2023-01-27 DIAGNOSIS — M21372 Foot drop, left foot: Secondary | ICD-10-CM

## 2023-01-27 DIAGNOSIS — M79674 Pain in right toe(s): Secondary | ICD-10-CM

## 2023-01-27 DIAGNOSIS — E1140 Type 2 diabetes mellitus with diabetic neuropathy, unspecified: Secondary | ICD-10-CM | POA: Diagnosis not present

## 2023-01-27 DIAGNOSIS — B351 Tinea unguium: Secondary | ICD-10-CM | POA: Diagnosis not present

## 2023-01-27 DIAGNOSIS — E1165 Type 2 diabetes mellitus with hyperglycemia: Secondary | ICD-10-CM

## 2023-01-27 NOTE — Progress Notes (Signed)
No ICM remote transmission received for 01/24/2023 and next ICM transmission scheduled for 02/14/2023.

## 2023-01-27 NOTE — Progress Notes (Signed)
  Subjective:  Patient ID: [name], female    DOB: 20-Sep-1966,  MRN: [id number]  Chief Complaint  Patient presents with   Nail Problem    Nail trim   57 y.o. female returns for the above complaint.  Patient presents with thickened elongated dystrophic mycotic toenails x 10 mild pain on palpation.  She is not able to debride down herself she would like for me to do it she denies any other acute problems respiratory conditions with pressure she has secondary complaint of dry foot due to stroke.  She states that she was giving up bracing for it but has started to wear out.  She would like to obtain another brace from [facility].  Objective:  There were no vitals filed for this visit. Podiatric Exam: Vascular: dorsalis pedis and posterior tibial pulses are palpable bilateral. Capillary return is immediate. Temperature gradient is WNL. Skin turgor WNL  Sensorium: Normal Semmes Weinstein monofilament test. Normal tactile sensation bilaterally. Nail Exam: Pt has thick disfigured discolored nails with subungual debris noted bilateral entire nail hallux through fifth toenails.  Pain on palpation to the nails. Ulcer Exam: There is no evidence of ulcer or pre-ulcerative changes or infection. Orthopedic Exam: Muscle tone and strength are WNL. No limitations in general ROM. No crepitus or effusions noted.  Dropfoot noted with weakness of the extensor muscle group type strong posterior muscle group noted.  Good strength noted to the posterior group with 4 out of 5 1 out of 5 on the extensor muscle group.  Plan dropfoot clinically appreciated Skin: No Porokeratosis. No infection or ulcers    Assessment & Plan:   1. Left foot drop   2. Pain due to onychomycosis of toenails of both feet   3. Poorly controlled type 2 diabetes mellitus with neuropathy [facility])     Patient was evaluated and treated and all questions answered.  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  Onychomycosis with pain  -Nails palliatively debrided as below. -Educated on self-care  Procedure: Nail Debridement Rationale: pain  Type of Debridement: manual, sharp debridement. Instrumentation: Nail nipper, rotary burr. Number of Nails: 10  Procedures and Treatment: Consent by patient was obtained for treatment procedures. The patient understood the discussion of treatment and procedures well. All questions were answered thoroughly reviewed. Debridement of mycotic and hypertrophic toenails, 1 through 5 bilateral and clearing of subungual debris. No ulceration, no infection noted.  Return Visit-Office Procedure: Patient instructed to return to the office for a follow up visit 3 months for continued evaluation and treatment.  Boneta Lucks, DPM    No follow-ups on file.

## 2023-02-01 NOTE — Progress Notes (Signed)
Remote ICD transmission.   

## 2023-02-03 ENCOUNTER — Other Ambulatory Visit (HOSPITAL_COMMUNITY): Payer: Self-pay | Admitting: Cardiology

## 2023-02-03 DIAGNOSIS — I5042 Chronic combined systolic (congestive) and diastolic (congestive) heart failure: Secondary | ICD-10-CM

## 2023-02-05 ENCOUNTER — Other Ambulatory Visit: Payer: Self-pay | Admitting: Internal Medicine

## 2023-02-05 DIAGNOSIS — I10 Essential (primary) hypertension: Secondary | ICD-10-CM

## 2023-02-07 ENCOUNTER — Telehealth (HOSPITAL_COMMUNITY): Payer: Self-pay | Admitting: Mental Health

## 2023-02-07 ENCOUNTER — Ambulatory Visit (HOSPITAL_COMMUNITY): Payer: Medicaid Other | Admitting: Mental Health

## 2023-02-07 ENCOUNTER — Encounter (HOSPITAL_COMMUNITY): Payer: Self-pay

## 2023-02-07 NOTE — Telephone Encounter (Signed)
Therapist sent link to [facility] to engage in outpatient tele-assessment. No response after x 10 minutes. Contacted pt via telephone, no answer. Left HIPAA compliant voicemail. No show

## 2023-02-10 ENCOUNTER — Other Ambulatory Visit: Payer: Self-pay | Admitting: Student

## 2023-02-11 ENCOUNTER — Other Ambulatory Visit: Payer: Self-pay | Admitting: Student

## 2023-02-11 DIAGNOSIS — G40209 Localization-related (focal) (partial) symptomatic epilepsy and epileptic syndromes with complex partial seizures, not intractable, without status epilepticus: Secondary | ICD-10-CM

## 2023-02-14 ENCOUNTER — Ambulatory Visit: Payer: PRIVATE HEALTH INSURANCE

## 2023-02-14 DIAGNOSIS — I428 Other cardiomyopathies: Secondary | ICD-10-CM

## 2023-02-18 ENCOUNTER — Telehealth: Payer: Self-pay

## 2023-02-18 NOTE — Progress Notes (Signed)
Remote ICD transmission.   

## 2023-02-18 NOTE — Telephone Encounter (Signed)
The patient agreed to send missed ICM transmission. 

## 2023-02-21 ENCOUNTER — Telehealth: Payer: Self-pay | Admitting: *Deleted

## 2023-02-21 NOTE — Progress Notes (Signed)
No ICM remote transmission received for 02/14/2023 and next ICM transmission scheduled for 03/09/2023.

## 2023-02-21 NOTE — Telephone Encounter (Signed)
Patient called asking for new Rx for drop foot brace to be sent to [facility] new Rx to [facility] as requested

## 2023-03-03 ENCOUNTER — Telehealth (HOSPITAL_COMMUNITY): Payer: Medicaid Other | Admitting: Physician Assistant

## 2023-03-04 ENCOUNTER — Telehealth (HOSPITAL_COMMUNITY): Payer: Medicaid Other | Admitting: Physician Assistant

## 2023-03-04 ENCOUNTER — Encounter (HOSPITAL_COMMUNITY): Payer: Self-pay

## 2023-03-05 ENCOUNTER — Other Ambulatory Visit: Payer: Self-pay

## 2023-03-05 ENCOUNTER — Other Ambulatory Visit: Payer: Self-pay | Admitting: Internal Medicine

## 2023-03-05 ENCOUNTER — Encounter (HOSPITAL_COMMUNITY): Payer: Self-pay | Admitting: Internal Medicine

## 2023-03-05 ENCOUNTER — Emergency Department (HOSPITAL_COMMUNITY): Payer: Medicaid Other

## 2023-03-05 ENCOUNTER — Observation Stay (HOSPITAL_COMMUNITY): Payer: Medicaid Other

## 2023-03-05 ENCOUNTER — Inpatient Hospital Stay (HOSPITAL_COMMUNITY)
Admission: EM | Admit: 2023-03-05 | Discharge: 2023-03-11 | DRG: 854 | Disposition: A | Discharge status: Home Health | Payer: Medicaid Other | Attending: Internal Medicine | Admitting: Internal Medicine

## 2023-03-05 ENCOUNTER — Other Ambulatory Visit (HOSPITAL_COMMUNITY): Payer: Self-pay | Admitting: Physician Assistant

## 2023-03-05 DIAGNOSIS — E1142 Type 2 diabetes mellitus with diabetic polyneuropathy: Secondary | ICD-10-CM | POA: Diagnosis present

## 2023-03-05 DIAGNOSIS — F419 Anxiety disorder, unspecified: Secondary | ICD-10-CM | POA: Diagnosis present

## 2023-03-05 DIAGNOSIS — I480 Paroxysmal atrial fibrillation: Secondary | ICD-10-CM | POA: Diagnosis present

## 2023-03-05 DIAGNOSIS — G4733 Obstructive sleep apnea (adult) (pediatric): Secondary | ICD-10-CM | POA: Diagnosis present

## 2023-03-05 DIAGNOSIS — I428 Other cardiomyopathies: Secondary | ICD-10-CM | POA: Diagnosis present

## 2023-03-05 DIAGNOSIS — I4891 Unspecified atrial fibrillation: Secondary | ICD-10-CM | POA: Diagnosis present

## 2023-03-05 DIAGNOSIS — E1169 Type 2 diabetes mellitus with other specified complication: Secondary | ICD-10-CM | POA: Diagnosis present

## 2023-03-05 DIAGNOSIS — L03115 Cellulitis of right lower limb: Secondary | ICD-10-CM | POA: Diagnosis present

## 2023-03-05 DIAGNOSIS — Z87440 Personal history of urinary (tract) infections: Secondary | ICD-10-CM

## 2023-03-05 DIAGNOSIS — I110 Hypertensive heart disease with heart failure: Secondary | ICD-10-CM | POA: Diagnosis present

## 2023-03-05 DIAGNOSIS — Z888 Allergy status to other drugs, medicaments and biological substances status: Secondary | ICD-10-CM

## 2023-03-05 DIAGNOSIS — R519 Headache, unspecified: Secondary | ICD-10-CM | POA: Diagnosis not present

## 2023-03-05 DIAGNOSIS — E785 Hyperlipidemia, unspecified: Secondary | ICD-10-CM | POA: Diagnosis present

## 2023-03-05 DIAGNOSIS — E1140 Type 2 diabetes mellitus with diabetic neuropathy, unspecified: Secondary | ICD-10-CM | POA: Diagnosis present

## 2023-03-05 DIAGNOSIS — M869 Osteomyelitis, unspecified: Secondary | ICD-10-CM | POA: Diagnosis present

## 2023-03-05 DIAGNOSIS — M87874 Other osteonecrosis, right foot: Secondary | ICD-10-CM | POA: Diagnosis present

## 2023-03-05 DIAGNOSIS — A419 Sepsis, unspecified organism: Principal | ICD-10-CM

## 2023-03-05 DIAGNOSIS — I69354 Hemiplegia and hemiparesis following cerebral infarction affecting left non-dominant side: Secondary | ICD-10-CM

## 2023-03-05 DIAGNOSIS — Z881 Allergy status to other antibiotic agents status: Secondary | ICD-10-CM

## 2023-03-05 DIAGNOSIS — Z6835 Body mass index (BMI) 35.0-35.9, adult: Secondary | ICD-10-CM

## 2023-03-05 DIAGNOSIS — E1165 Type 2 diabetes mellitus with hyperglycemia: Secondary | ICD-10-CM | POA: Diagnosis present

## 2023-03-05 DIAGNOSIS — L02611 Cutaneous abscess of right foot: Secondary | ICD-10-CM | POA: Diagnosis present

## 2023-03-05 DIAGNOSIS — Z560 Unemployment, unspecified: Secondary | ICD-10-CM

## 2023-03-05 DIAGNOSIS — B957 Other staphylococcus as the cause of diseases classified elsewhere: Secondary | ICD-10-CM | POA: Diagnosis present

## 2023-03-05 DIAGNOSIS — I10 Essential (primary) hypertension: Secondary | ICD-10-CM | POA: Diagnosis present

## 2023-03-05 DIAGNOSIS — F411 Generalized anxiety disorder: Secondary | ICD-10-CM

## 2023-03-05 DIAGNOSIS — E11628 Type 2 diabetes mellitus with other skin complications: Secondary | ICD-10-CM | POA: Diagnosis present

## 2023-03-05 DIAGNOSIS — E669 Obesity, unspecified: Secondary | ICD-10-CM | POA: Diagnosis present

## 2023-03-05 DIAGNOSIS — N179 Acute kidney failure, unspecified: Secondary | ICD-10-CM | POA: Diagnosis present

## 2023-03-05 DIAGNOSIS — Z5982 Transportation insecurity: Secondary | ICD-10-CM

## 2023-03-05 DIAGNOSIS — Z7985 Long-term (current) use of injectable non-insulin antidiabetic drugs: Secondary | ICD-10-CM

## 2023-03-05 DIAGNOSIS — M5136 Other intervertebral disc degeneration, lumbar region: Secondary | ICD-10-CM | POA: Diagnosis present

## 2023-03-05 DIAGNOSIS — F5105 Insomnia due to other mental disorder: Secondary | ICD-10-CM

## 2023-03-05 DIAGNOSIS — Z8249 Family history of ischemic heart disease and other diseases of the circulatory system: Secondary | ICD-10-CM

## 2023-03-05 DIAGNOSIS — Z7901 Long term (current) use of anticoagulants: Secondary | ICD-10-CM

## 2023-03-05 DIAGNOSIS — Z95810 Presence of automatic (implantable) cardiac defibrillator: Secondary | ICD-10-CM

## 2023-03-05 DIAGNOSIS — I69359 Hemiplegia and hemiparesis following cerebral infarction affecting unspecified side: Secondary | ICD-10-CM

## 2023-03-05 DIAGNOSIS — F32A Depression, unspecified: Secondary | ICD-10-CM | POA: Diagnosis present

## 2023-03-05 DIAGNOSIS — R0789 Other chest pain: Secondary | ICD-10-CM | POA: Diagnosis present

## 2023-03-05 DIAGNOSIS — Z79899 Other long term (current) drug therapy: Secondary | ICD-10-CM

## 2023-03-05 DIAGNOSIS — I5042 Chronic combined systolic (congestive) and diastolic (congestive) heart failure: Secondary | ICD-10-CM | POA: Diagnosis present

## 2023-03-05 DIAGNOSIS — Z9851 Tubal ligation status: Secondary | ICD-10-CM

## 2023-03-05 DIAGNOSIS — Z833 Family history of diabetes mellitus: Secondary | ICD-10-CM

## 2023-03-05 DIAGNOSIS — K219 Gastro-esophageal reflux disease without esophagitis: Secondary | ICD-10-CM | POA: Diagnosis present

## 2023-03-05 DIAGNOSIS — G8929 Other chronic pain: Secondary | ICD-10-CM | POA: Diagnosis present

## 2023-03-05 DIAGNOSIS — J45909 Unspecified asthma, uncomplicated: Secondary | ICD-10-CM | POA: Diagnosis present

## 2023-03-05 DIAGNOSIS — L089 Local infection of the skin and subcutaneous tissue, unspecified: Secondary | ICD-10-CM | POA: Diagnosis present

## 2023-03-05 DIAGNOSIS — E876 Hypokalemia: Secondary | ICD-10-CM | POA: Diagnosis present

## 2023-03-05 DIAGNOSIS — Z823 Family history of stroke: Secondary | ICD-10-CM

## 2023-03-05 DIAGNOSIS — I2510 Atherosclerotic heart disease of native coronary artery without angina pectoris: Secondary | ICD-10-CM | POA: Diagnosis present

## 2023-03-05 DIAGNOSIS — G40909 Epilepsy, unspecified, not intractable, without status epilepticus: Secondary | ICD-10-CM | POA: Diagnosis present

## 2023-03-05 DIAGNOSIS — Z794 Long term (current) use of insulin: Secondary | ICD-10-CM

## 2023-03-05 HISTORY — DX: Local infection of the skin and subcutaneous tissue, unspecified: L08.9

## 2023-03-05 LAB — CBC
HCT: 33 % — ABNORMAL LOW (ref 36.0–46.0)
Hemoglobin: 10.9 g/dL — ABNORMAL LOW (ref 12.0–15.0)
MCH: 27.1 pg (ref 26.0–34.0)
MCHC: 33 g/dL (ref 30.0–36.0)
MCV: 82.1 fL (ref 80.0–100.0)
Platelets: 211 10*3/uL (ref 150–400)
RBC: 4.02 MIL/uL (ref 3.87–5.11)
RDW: 12.7 % (ref 11.5–15.5)
WBC: 16.9 10*3/uL — ABNORMAL HIGH (ref 4.0–10.5)
nRBC: 0 % (ref 0.0–0.2)

## 2023-03-05 LAB — BASIC METABOLIC PANEL
Anion gap: 14 (ref 5–15)
BUN: 7 mg/dL (ref 6–20)
CO2: 25 mmol/L (ref 22–32)
Calcium: 9 mg/dL (ref 8.9–10.3)
Chloride: 94 mmol/L — ABNORMAL LOW (ref 98–111)
Creatinine, Ser: 0.84 mg/dL (ref 0.44–1.00)
GFR, Estimated: >60 mL/min (ref >60)
Glucose, Bld: 277 mg/dL — ABNORMAL HIGH (ref 70–99)
Potassium: 3.2 mmol/L — ABNORMAL LOW (ref 3.5–5.1)
Sodium: 133 mmol/L — ABNORMAL LOW (ref 135–145)

## 2023-03-05 LAB — LACTIC ACID, PLASMA
Lactic Acid, Venous: 1.4 mmol/L (ref 0.5–1.9)
Lactic Acid, Venous: 1.1 mmol/L (ref 0.5–1.9)

## 2023-03-05 LAB — TROPONIN I (HIGH SENSITIVITY)
Troponin I (High Sensitivity): 6 ng/L (ref <18)
Troponin I (High Sensitivity): 7 ng/L (ref <18)

## 2023-03-05 LAB — GLUCOSE, CAPILLARY: Glucose-Capillary: 212 mg/dL — ABNORMAL HIGH (ref 70–99)

## 2023-03-05 MED ORDER — BUSPIRONE HCL 5 MG PO TABS
7.5000 mg | ORAL_TABLET | Freq: Two times a day (BID) | ORAL | Status: DC
  Administered 2023-03-05 – 2023-03-11 (×12): 7.5 mg via ORAL
  Filled 2023-03-05 (×8): qty 2
  Filled 2023-03-05: qty 1
  Filled 2023-03-05 (×4): qty 2

## 2023-03-05 MED ORDER — INSULIN DETEMIR 100 UNIT/ML ~~LOC~~ SOLN
17.0000 [IU] | Freq: Every day | SUBCUTANEOUS | Status: DC
  Administered 2023-03-05: 17 [IU] via SUBCUTANEOUS
  Filled 2023-03-05 (×2): qty 0.17

## 2023-03-05 MED ORDER — ISOSORBIDE MONONITRATE ER 60 MG PO TB24
90.0000 mg | ORAL_TABLET | Freq: Every day | ORAL | Status: DC
  Administered 2023-03-05 – 2023-03-11 (×7): 90 mg via ORAL
  Filled 2023-03-05: qty 3
  Filled 2023-03-05 (×6): qty 1

## 2023-03-05 MED ORDER — ATORVASTATIN CALCIUM 40 MG PO TABS
40.0000 mg | ORAL_TABLET | Freq: Every day | ORAL | Status: DC
  Administered 2023-03-06 – 2023-03-11 (×6): 40 mg via ORAL
  Filled 2023-03-05 (×6): qty 1

## 2023-03-05 MED ORDER — LINEZOLID 600 MG/300ML IV SOLN
600.0000 mg | Freq: Two times a day (BID) | INTRAVENOUS | Status: DC
  Administered 2023-03-05 – 2023-03-11 (×12): 600 mg via INTRAVENOUS
  Filled 2023-03-05 (×14): qty 300

## 2023-03-05 MED ORDER — LEVETIRACETAM 500 MG PO TABS
500.0000 mg | ORAL_TABLET | Freq: Two times a day (BID) | ORAL | Status: DC
  Administered 2023-03-05 – 2023-03-11 (×12): 500 mg via ORAL
  Filled 2023-03-05 (×12): qty 1

## 2023-03-05 MED ORDER — INSULIN DETEMIR 100 UNIT/ML ~~LOC~~ SOLN: 25.0000 [IU] | Freq: Every day | SUBCUTANEOUS | Status: DC

## 2023-03-05 MED ORDER — PIPERACILLIN-TAZOBACTAM 3.375 G IVPB
3.3750 g | Freq: Three times a day (TID) | INTRAVENOUS | Status: DC
  Administered 2023-03-05 – 2023-03-10 (×13): 3.375 g via INTRAVENOUS
  Filled 2023-03-05 (×13): qty 50

## 2023-03-05 MED ORDER — TRAZODONE HCL 50 MG PO TABS
50.0000 mg | ORAL_TABLET | Freq: Every day | ORAL | Status: DC
  Administered 2023-03-05 – 2023-03-10 (×6): 50 mg via ORAL
  Filled 2023-03-05 (×7): qty 1

## 2023-03-05 MED ORDER — ACETAMINOPHEN 325 MG PO TABS
650.0000 mg | ORAL_TABLET | Freq: Four times a day (QID) | ORAL | Status: DC | PRN
  Administered 2023-03-05: 650 mg via ORAL
  Filled 2023-03-05: qty 2

## 2023-03-05 MED ORDER — PIPERACILLIN-TAZOBACTAM 3.375 G IVPB 30 MIN
3.3750 g | Freq: Once | INTRAVENOUS | Status: AC
  Administered 2023-03-05: 3.375 g via INTRAVENOUS
  Filled 2023-03-05: qty 50

## 2023-03-05 MED ORDER — OXYCODONE HCL 5 MG PO TABS
5.0000 mg | ORAL_TABLET | ORAL | Status: DC | PRN
  Administered 2023-03-05 – 2023-03-09 (×6): 5 mg via ORAL
  Filled 2023-03-05 (×6): qty 1

## 2023-03-05 MED ORDER — FUROSEMIDE 40 MG PO TABS
40.0000 mg | ORAL_TABLET | ORAL | Status: DC
  Administered 2023-03-07 – 2023-03-10 (×2): 40 mg via ORAL
  Filled 2023-03-05 (×2): qty 1

## 2023-03-05 MED ORDER — POTASSIUM CHLORIDE CRYS ER 20 MEQ PO TBCR
40.0000 meq | EXTENDED_RELEASE_TABLET | Freq: Once | ORAL | Status: AC
  Administered 2023-03-05: 40 meq via ORAL
  Filled 2023-03-05: qty 2

## 2023-03-05 MED ORDER — ESCITALOPRAM OXALATE 10 MG PO TABS
10.0000 mg | ORAL_TABLET | Freq: Every day | ORAL | Status: DC
  Administered 2023-03-06 – 2023-03-11 (×6): 10 mg via ORAL
  Filled 2023-03-05 (×6): qty 1

## 2023-03-05 MED ORDER — VANCOMYCIN HCL 2000 MG/400ML IV SOLN
2000.0000 mg | Freq: Once | INTRAVENOUS | Status: DC
  Administered 2023-03-05: 2000 mg via INTRAVENOUS
  Filled 2023-03-05: qty 400

## 2023-03-05 MED ORDER — GABAPENTIN 300 MG PO CAPS
300.0000 mg | ORAL_CAPSULE | Freq: Two times a day (BID) | ORAL | Status: DC
  Administered 2023-03-05 – 2023-03-11 (×12): 300 mg via ORAL
  Filled 2023-03-05 (×12): qty 1

## 2023-03-05 MED ORDER — INSULIN ASPART 100 UNIT/ML IJ SOLN
0.0000 [IU] | Freq: Three times a day (TID) | INTRAMUSCULAR | Status: DC
  Administered 2023-03-06: 5 [IU] via SUBCUTANEOUS
  Administered 2023-03-06: 2 [IU] via SUBCUTANEOUS
  Administered 2023-03-06: 15 [IU] via SUBCUTANEOUS
  Administered 2023-03-07: 11 [IU] via SUBCUTANEOUS
  Administered 2023-03-07: 3 [IU] via SUBCUTANEOUS
  Administered 2023-03-07: 11 [IU] via SUBCUTANEOUS
  Administered 2023-03-08: 2 [IU] via SUBCUTANEOUS
  Administered 2023-03-08: 3 [IU] via SUBCUTANEOUS
  Administered 2023-03-08: 5 [IU] via SUBCUTANEOUS
  Administered 2023-03-09: 3 [IU] via SUBCUTANEOUS

## 2023-03-05 MED ORDER — POLYETHYLENE GLYCOL 3350 17 G PO PACK: 17.0000 g | PACK | Freq: Every day | ORAL | Status: DC | PRN

## 2023-03-05 MED ORDER — LIDOCAINE 5 % EX PTCH
1.0000 | MEDICATED_PATCH | CUTANEOUS | Status: DC
  Administered 2023-03-05 – 2023-03-10 (×6): 1 via TRANSDERMAL
  Filled 2023-03-05 (×6): qty 1

## 2023-03-05 MED ORDER — METOPROLOL SUCCINATE ER 100 MG PO TB24
100.0000 mg | ORAL_TABLET | Freq: Every day | ORAL | Status: DC
  Administered 2023-03-05 – 2023-03-11 (×7): 100 mg via ORAL
  Filled 2023-03-05 (×2): qty 1
  Filled 2023-03-05: qty 4
  Filled 2023-03-05 (×4): qty 1

## 2023-03-05 MED ORDER — AMLODIPINE BESYLATE 10 MG PO TABS
10.0000 mg | ORAL_TABLET | Freq: Every day | ORAL | Status: DC
  Administered 2023-03-05 – 2023-03-11 (×7): 10 mg via ORAL
  Filled 2023-03-05: qty 2
  Filled 2023-03-05 (×6): qty 1

## 2023-03-05 MED ORDER — ACETAMINOPHEN 650 MG RE SUPP: 650.0000 mg | Freq: Four times a day (QID) | RECTAL | Status: DC | PRN

## 2023-03-05 MED ORDER — SPIRONOLACTONE 25 MG PO TABS
100.0000 mg | ORAL_TABLET | Freq: Every day | ORAL | Status: DC
  Administered 2023-03-05 – 2023-03-10 (×6): 100 mg via ORAL
  Filled 2023-03-05 (×6): qty 4
  Filled 2023-03-05: qty 8

## 2023-03-05 MED ORDER — IOHEXOL 350 MG/ML SOLN
100.0000 mL | Freq: Once | INTRAVENOUS | Status: AC | PRN
  Administered 2023-03-05: 100 mL via INTRAVENOUS

## 2023-03-05 MED ORDER — LIDOCAINE-EPINEPHRINE (PF) 2 %-1:200000 IJ SOLN
10.0000 mL | Freq: Once | INTRAMUSCULAR | Status: AC
  Administered 2023-03-05: 10 mL via INTRADERMAL

## 2023-03-05 MED ORDER — VANCOMYCIN HCL 1500 MG/300ML IV SOLN: 1500.0000 mg | INTRAVENOUS | Status: DC

## 2023-03-05 NOTE — Consult Note (Signed)
ORTHOPAEDIC CONSULTATION  REQUESTING PHYSICIAN: Deno Etienne, DO  Chief Complaint: right foot pain and swelling  HPI: [name] is a 57 y.o. female who complains of pain in the right foot moving proximal up the leg for several days. Denies any known injury or trauma to the area recently but reports stepping on glass as a child and having a small procedure to this same area of the right foot to clean it out. Has neuropathy at baseline but could feel the pain and swelling worsening. She then began feeling systemically unwell so came to the ER.  Imaging shows  - No acute osseous abnormalities - Large area of soft tissue gas and soft tissue swelling at the lateral margin of the fifth MTP joint with several associated radiopaque foreign bodies question glass fragments - Findings are consistent with abscess/infection by gas-forming organism - No radiographic evidence of osteomyelitis   Orthopedics was consulted for evaluation.   Last meal several hours ago. Is currently NPO. + h/o CVA with lasting L side effects. No history of MI, DVT, PE.  Previously ambulatory with use of cane and wheelchair. The patient is living in a 1 story home with a roommate.    Past Medical History:  Diagnosis Date   Acute cystitis 06/19/2021   AKI (acute kidney injury) ([place]) 09/13/2022   Allergic rhinitis    Arthritis    "hips, back, legs, arms" (07/04/2014)   Asthma    hx   Automatic implantable cardioverter-defibrillator in situ    Calcifying tendinitis of shoulder    Chronic combined systolic and diastolic CHF (congestive heart failure) ([place])    EF 40-45% by echo 12/06/2012   Chronic diastolic heart failure ([place])     Primarily diastolic CHF: Likely due to uncontrolled HTN. Last echo (8/12) with EF 45-50%, mild to moderate LVH with some asymmetric septal hypertrophy, RV normal size and systolic function. EF 50-55% by LV-gram in 6/12.    Chronic lower back pain    secondary to DJD, obsetiy, hip  problems. Followed by Dr. Oval Linsey (pain management)   Chronic systolic heart failure ([place]) 05/17/2022   Chronic use of opiate for therapeutic purpose 12/14/2016   Contact with and (suspected) exposure to covid-19 02/07/2020   Coronary artery disease    questionable. [place] 05/2011 showing normal coronaries // Followed at [facility], Dr. Aundra Dubin   Degeneration of lumbar or lumbosacral intervertebral disc    DJD (degenerative joint disease) of hip    right sided   Fatigue 04/27/2022   Frequent UTI    GERD (gastroesophageal reflux disease)    HLD (hyperlipidemia)    Hypertension    Poorly controlled. Has had HTN since age 75. Angioedema with ACEI.  24 Hr urine and renal arterial dopplers ordered . . . Never done   LBBB (left bundle branch block)    Left shoulder pain 06/30/2015   Left spastic hemiparesis ([place]) 09/23/2015   Liver disease    Lumbago 11/22/2016   Morbid obesity ([place])    Muscle spasm 06/19/2021   Need for pneumococcal vaccine 09/09/2017   NICM (nonischemic cardiomyopathy) ([place])    EF 45-50% in 8/12, cath 6/12 showed normal coronaries, EF 50-55% by LV gram   OSA on CPAP    sleep study in 8/12 showed moderate to severe OSA requiring CPAP   Perimenopausal 03/28/2017   Polyneuropathy in diabetes(357.2)    Presence of permanent cardiac pacemaker    Rash 07/14/2016   Seizures ([place])  last 3 months   Shortness of breath    none now   Stroke [facility]) 12/2013   "my left side is paralyzed" (07/04/2014)   Thoracic or lumbosacral neuritis or radiculitis, unspecified    Type II diabetes mellitus ([place]) DX: 2002   Urinary frequency 11/05/2014   Vaginal discharge 02/05/2021   Past Surgical History:  Procedure Laterality Date   BI-VENTRICULAR IMPLANTABLE CARDIOVERTER DEFIBRILLATOR N/A 08/22/2013   Procedure: BI-VENTRICULAR IMPLANTABLE CARDIOVERTER DEFIBRILLATOR  (CRT-D);  Surgeon: Deboraha Sprang, MD;  Location: [facility] CATH LAB;  Service: Cardiovascular;  Laterality: N/A;    BI-VENTRICULAR IMPLANTABLE CARDIOVERTER DEFIBRILLATOR  (CRT-D)  08/2013   Archie Endo 08/23/2013   BIV ICD GENERATOR CHANGEOUT N/A 03/18/2021   Procedure: BIV ICD GENERATOR CHANGEOUT;  Surgeon: Deboraha Sprang, MD;  Location: [place] CV LAB;  Service: Cardiovascular;  Laterality: N/A;   BREAST SURGERY Bilateral 2011   patient reports benign results   CARDIAC CATHETERIZATION  05/2011   CARPAL TUNNEL RELEASE Left    denies   HEMIARTHROPLASTY SHOULDER FRACTURE Right 1980's   denies   MULTIPLE EXTRACTIONS WITH ALVEOLOPLASTY Bilateral 05/20/2017   Procedure: MULTIPLE EXTRACTION;  Surgeon: Diona Browner, DDS;  Location: [place];  Service: Oral Surgery;  Laterality: Bilateral;   MULTIPLE TOOTH EXTRACTIONS  ~ 2011   tumors removed ; "my whole top"   SHOULDER ARTHROSCOPY Right 12/26/2015   Procedure: Right Shoulder Arthroscopy, Debridement, and Decompression;  Surgeon: Newt Minion, MD;  Location: [place];  Service: Orthopedics;  Laterality: Right;   TEE WITHOUT CARDIOVERSION N/A 01/14/2014   Procedure: TRANSESOPHAGEAL ECHOCARDIOGRAM (TEE);  Surgeon: Sanda Klein, MD;  Location: [facility] ENDOSCOPY;  Service: Cardiovascular;  Laterality: N/A;   TUBAL LIGATION  05/31/1985   Social History   Socioeconomic History   Marital status: Single    Spouse name: Not on file   Number of children: 2   Years of education: 9th grade   Highest education level: Not on file  Occupational History   Occupation: Unemployed    Comment: planning on getting disability  Tobacco Use   Smoking status: Never   Smokeless tobacco: Never  Vaping Use   Vaping Use: Never used  Substance and Sexual Activity   Alcohol use: No    Alcohol/week: 0.0 standard drinks of alcohol   Drug use: No   Sexual activity: Not on file  Other Topics Concern   Not on file  Social History Narrative   Lives in [place] with her son. Is able to read and write fluently in [place].   Social Determinants of Health   Financial Resource Strain: Patient  Declined (11/18/2022)   Overall Financial Resource Strain (CARDIA)    Difficulty of Paying Living Expenses: Patient declined  Food Insecurity: Food Insecurity Present (11/18/2022)   Hunger Vital Sign    Worried About Running Out of Food in the Last Year: Often true    Ran Out of Food in the Last Year: Often true  Transportation Needs: Unmet Transportation Needs (11/18/2022)   PRAPARE - Hydrologist (Medical): Yes    Lack of Transportation (Non-Medical): Yes  Physical Activity: Not on file  Stress: Not on file  Social Connections: Not on file   Family History  Problem Relation Age of Onset   Heart disease Mother 26       Died of MI at age 16 yo   Kidney disease Mother        requiring dialysis   Congestive Heart Failure Mother  Heart disease Father 6       MI age 13yo requiring stenting   Diabetes Father    Glaucoma Father    Heart disease Paternal Grandmother        requiring pacemaker.   Heart disease Paternal Grandfather 14       Died of MI at possibly age 44-53yo   Stroke Paternal Grandfather    Diabetes Brother    Heart disease Brother 76       MI at age 43 years old   Breast cancer Paternal Aunt    Breast cancer Maternal Grandmother    Allergies  Allergen Reactions   Ace Inhibitors Anaphylaxis and Other (See Comments)    Swelling of the tongue and throat    Cleocin [Clindamycin] Anaphylaxis and Swelling   Enalapril Maleate Anaphylaxis and Other (See Comments)    Swelling of the tongue and throat    Zestril [Lisinopril] Anaphylaxis and Other (See Comments)    Swelling of the tongue and throat    Prior to Admission medications   Medication Sig Start Date End Date Taking? Authorizing Provider  Accu-Chek FastClix Lancets MISC USE TO CHECK BLOOD SUGAR TWICE DAILY 12/31/22   Atway, Rayann N, DO  ACCU-CHEK GUIDE test strip USE TO CHECK BLOOD SUGAR TWICE DAILY 01/23/23   Atway, Rayann N, DO  albuterol (VENTOLIN HFA) 108 (90 Base) MCG/ACT  inhaler Inhale 2 puffs into the lungs every 6 (six) hours as needed for wheezing or shortness of breath. 11/18/22   Atway, Rayann N, DO  amLODipine (NORVASC) 10 MG tablet Take 1 tablet (10 mg total) by mouth daily. 11/18/22   Atway, Rayann N, DO  atorvastatin (LIPITOR) 40 MG tablet Take 1 tablet (40 mg total) by mouth daily. 11/18/22   Atway, Jeananne Rama, DO  Blood Glucose Monitoring Suppl (ACCU-CHEK GUIDE) w/Device KIT Use As Directed 09/15/22   Rick Duff, MD  busPIRone (BUSPAR) 7.5 MG tablet Take 1 tablet (7.5 mg total) by mouth 2 (two) times daily. 01/20/23 01/20/24  Nwoko, Isidoro Donning E, PA  COMFORT EZ PEN NEEDLES 31G X 8 MM MISC USE TO INJECT INSULIN IN THE MORNING, NOON, EVENING, AND BEDTIME 01/11/23   Atway, Rayann N, DO  cyanocobalamin 1000 MCG tablet Take 1 tablet by mouth daily. 02/06/19   [provider]  cyclobenzaprine (FLEXERIL) 5 MG tablet TAKE ONE TABLET BY MOUTH THREE TIMES DAILY AS NEEDED FOR MUSCLE SPASMS Patient taking differently: Take 5 mg by mouth 3 (three) times daily as needed for muscle spasms. 09/02/22   [name], MD  diclofenac Sodium (VOLTAREN) 1 % GEL APPLY 4 GRAMS TOPICALLY TO LEFT KNEE FOUR TIMES DAILY. Patient taking differently: Apply 4 g topically 4 (four) times daily. Left knee 01/12/22   [name], MD  Dulaglutide (TRULICITY) [id number] [id number] SOPN Inject 0.75 mg into the skin once a week. 11/18/22   Atway, Rayann N, DO  escitalopram (LEXAPRO) 10 MG tablet Take 1 tablet (10 mg total) by mouth daily. 01/20/23   Nwoko, Isidoro Donning E, PA  fluticasone (CUTIVATE) 0.05 % cream Apply 1 Application topically 2 (two) times daily. 01/05/23   Atway, Jeananne Rama, DO  folic acid (FOLVITE) 1 MG tablet Take 1 tablet (1 mg total) by mouth daily. 02/10/23   Atway, Rayann N, DO  furosemide (LASIX) 40 MG tablet TAKE ONE TABLET BY MOUTH DAILY Patient taking differently: Take 40 mg by mouth 2 (two) times a week. 06/22/22   Larey Dresser, MD  gabapentin (NEURONTIN) 300 MG capsule Take  1  capsule (300 mg total) by mouth 2 (two) times daily. 11/17/22   Atway, Rayann N, DO  hydrALAZINE (APRESOLINE) 25 MG tablet Take 1 tablet (25 mg total) by mouth 3 (three) times daily. 11/18/22   Atway, Rayann N, DO  isosorbide mononitrate (IMDUR) 60 MG 24 hr tablet Take 1.5 tablets (90 mg total) by mouth daily. 02/03/23   Larey Dresser, MD  KLOR-CON M20 20 MEQ tablet TAKE ONE TABLET BY MOUTH DAILY 06/22/22   Larey Dresser, MD  LANTUS SOLOSTAR 100 UNIT/ML Solostar Pen INJECT 25 UNITS INTO THE SKIN EVERY DAY 01/07/23   Atway, Rayann N, DO  levETIRAcetam (KEPPRA) 500 MG tablet TAKE ONE TABLET BY MOUTH TWICE DAILY 02/11/23   Atway, Rayann N, DO  LINZESS 290 MCG CAPS capsule TAKE ONE CAPSULE BY MOUTH EVERY DAY AS NEEDED FOR CONSTIPATION 02/07/23   Atway, Rayann N, DO  liver oil-zinc oxide (DESITIN) 40 % ointment Apply topically every 4 (four) hours. 09/15/22   Rick Duff, MD  metoprolol succinate (TOPROL-XL) 100 MG 24 hr tablet TAKE ONE TABLET BY MOUTH EVERY DAY. (NEEDS OFFICE VISIT FOR FURTHER REFILLS) CALL [phone] TO SCHEDULE 01/15/23   Atway, Rayann N, DO  pantoprazole (PROTONIX) 40 MG tablet TAKE ONE TABLET BY MOUTH EVERY DAY Patient taking differently: Take 40 mg by mouth daily. 03/09/22   [name], MD  spironolactone (ALDACTONE) 100 MG tablet TAKE ONE TABLET BY MOUTH EVERY DAY 02/07/23   Atway, Rayann N, DO  STOOL SOFTENER/LAXATIVE 50-8.6 MG tablet TAKE ONE TABLET BY MOUTH AT BEDTIME AS NEEDED FOR MILD CONSTIPATION Patient taking differently: Take 1 tablet by mouth at bedtime as needed for mild constipation. 01/28/20   Velna Ochs, MD  traZODone (DESYREL) 50 MG tablet Take 1 tablet (50 mg total) by mouth at bedtime. 01/20/23   Nwoko, Terese Door, PA  Vitamin D, Ergocalciferol, (DRISDOL) 1.25 MG (50000 UNIT) CAPS capsule TAKE ONE CAPSULE BY MOUTH EVERY 7 DAYS 01/24/23   Atway, Rayann N, DO  XARELTO 20 MG TABS tablet TAKE ONE TABLET BY MOUTH EVERY DAY WITH SUPPER 01/05/23   Dorethea Clan,  DO   DG Chest Port 1 View  Result Date: 03/05/2023 CLINICAL DATA:  Cellulitis versus abscess of foot, chest pain EXAM: PORTABLE CHEST 1 VIEW COMPARISON:  Portable exam 1422 hours compared to 09/12/2022 FINDINGS: LEFT subclavian ICD with leads projecting over RIGHT atrium, RIGHT ventricle, and coronary sinus. Upper normal heart size. Mediastinal contours and pulmonary vascularity normal. Lungs clear. No infiltrate, pleural effusion, or pneumothorax. IMPRESSION: No acute abnormalities. Electronically Signed   By: Lavonia Dana M.D.   On: 03/05/2023 14:51   DG Foot Complete Right  Result Date: 03/05/2023 CLINICAL DATA:  Chest pain, type II diabetes mellitus, foot wound, abscess versus cellulitis of RIGHT foot EXAM: RIGHT FOOT COMPLETE - 3+ VIEW COMPARISON:  None available FINDINGS: Mild osseous demineralization. Minimal degenerative changes first MTP joint. Remaining joint spaces preserved. No acute fracture or dislocation. Large focus of soft tissue gas and swelling at the lateral margin of the fifth MTP joint with several associated angular radiopaque foreign bodies, 4 mm length adjacent to distal fifth metatarsal and 4 x 2 mm at the level of the fifth MTP joint, question glass fragments. Calcaneal spurring. No definite bone destruction identified to suggest osteomyelitis. IMPRESSION: No acute osseous abnormalities. Large area of soft tissue gas and soft tissue swelling at the lateral margin of the fifth MTP joint with several associated radiopaque foreign bodies question glass fragments.  Findings are consistent with abscess/infection by gas-forming organism. No radiographic evidence of osteomyelitis though if this remains a clinical concern, consider MR assessment. Electronically Signed   By: Lavonia Dana M.D.   On: 03/05/2023 14:50    Positive ROS: All other systems have been reviewed and were otherwise negative with the exception of those mentioned in the HPI and as above.  Objective: Labs cbc No  results for input(s): "WBC", "HGB", "HCT", "PLT" in the last 72 hours.  Labs inflam No results for input(s): "CRP" in the last 72 hours.  Invalid input(s): "ESR"  Labs coag No results for input(s): "INR", "PTT" in the last 72 hours.  Invalid input(s): "PT"  No results for input(s): "NA", "K", "CL", "CO2", "GLUCOSE", "BUN", "CREATININE", "CALCIUM" in the last 72 hours.  Physical Exam: Vitals:   03/05/23 1433 03/05/23 1436  BP:  (!) 192/97  Pulse:  (!) 117  Resp:  18  Temp:  (!) 101.6 F (38.7 C)  SpO2: 97% 96%   General: Alert, no acute distress.  Laying on stretcher, calm, obviously uncomfortable Mental status: Alert and Oriented x3 Neurologic: Speech Clear and organized, no gross focal findings or movement disorder appreciated. Respiratory: No cyanosis, no use of accessory musculature Cardiovascular: No pedal edema GI: Abdomen is soft and non-tender, non-distended. Skin: Warm and dry.  Extremities: Warm and well perfused w/o edema. L sided weakness from previous CVA Psychiatric: Patient is competent for consent with normal mood and affect  MUSCULOSKELETAL:  TTP lateral aspect of right foot near head of 5th metatarsal where incision is, blood tinged packing gauze coming from wound, no current purulent discharge but obvious smell of infection present, erythema present from toes up anterior aspect of right shin (patchy distribution), these areas also warm to touch, 2+ pitting edema to foot, ROM knee and ankle intact, can wiggle toes, calf soft and compressible, NVI otherwise  Other extremities are atraumatic with painless ROM and NVI.  Assessment / Plan: Active Problems:   * No active hospital problems. *    I&D was done by the ER physician. His note indicates copious purulent discharge was seen. Placed packing in the wound. Unsure if any cultures were taken or sent but don't see any orders. I will order bilateral ABIs to assess perfusion. Spoke with primary team about  ordering a right foot CT to further look for spread of infection since signs present on leg. Keep NPO for now. If nothing emergent seen on CT then can allow her to eat/drink until midnight.   Tentatively planning on right foot I&D tomorrow morning unless resolution of signs/symptoms by then. Will assess in AM to make final decision. NPO at midnight   Weightbearing: WBAT RLE Insicional and dressing care: Reinforce dressings as needed Orthopedic device(s): None but could give her a post-op shoe PRN Showering: keep wound dry VTE prophylaxis: per primary Pain control: per primary Follow - up plan:  TBD Contact information:  Edmonia Lynch MD, [facility] PA-C  Britt Bottom PA-C Office [phone] 03/05/2023 4:00 PM

## 2023-03-05 NOTE — Anesthesia Preprocedure Evaluation (Signed)
Anesthesia Evaluation  Patient identified by MRN, date of birth, ID band Patient awake    Reviewed: Allergy & Precautions, H&P , NPO status , Patient's Chart, lab work & pertinent test results  Airway Mallampati: III  TM Distance: >3 FB Neck ROM: Full    Dental  (+) Edentulous Upper, Edentulous Lower, Dental Advisory Given   Pulmonary asthma , sleep apnea and Continuous Positive Airway Pressure Ventilation    Pulmonary exam normal breath sounds clear to auscultation       Cardiovascular Exercise Tolerance: Good hypertension, Pt. on medications and Pt. on home beta blockers + CAD and +CHF  + dysrhythmias + pacemaker + Cardiac Defibrillator  Rhythm:Regular Rate:Normal  Echo 09/2022  1. Left ventricular ejection fraction, by estimation, is 25 to 30%. The left ventricle has severely decreased function. The left ventricle demonstrates global hypokinesis. There is moderate left ventricular hypertrophy. Left ventricular diastolic parameters are consistent with Grade I diastolic dysfunction (impaired relaxation).   2. Right ventricular systolic function is mildly reduced. The right ventricular size is normal. Tricuspid regurgitation signal is inadequate for assessing PA pressure.   3. The mitral valve is normal in structure. No evidence of mitral valve regurgitation.   4. The aortic valve is tricuspid. Aortic valve regurgitation is not visualized. No aortic stenosis is present.   5. The inferior vena cava is normal in size with greater than 50% respiratory variability, suggesting right atrial pressure of 3 mmHg.     Neuro/Psych Seizures -, Well Controlled,  PSYCHIATRIC DISORDERS Anxiety Depression    CVA, Residual Symptoms    GI/Hepatic Neg liver ROS,GERD  Medicated and Controlled,,  Endo/Other  diabetes, Insulin Dependent    Renal/GU Renal disease     Musculoskeletal  (+) Arthritis , Osteoarthritis,    Abdominal  (+) + obese   Peds  Hematology negative hematology ROS (+)   Anesthesia Other Findings   Reproductive/Obstetrics negative OB ROS                             Anesthesia Physical Anesthesia Plan  ASA: 4  Anesthesia Plan: General   Post-op Pain Management: Tylenol PO (pre-op)*   Induction: Intravenous  PONV Risk Score and Plan: 4 or greater and Ondansetron, Dexamethasone, Midazolam and Treatment may vary due to age or medical condition  Airway Management Planned: LMA  Additional Equipment: ClearSight  Intra-op Plan:   Post-operative Plan: Extubation in OR  Informed Consent: I have reviewed the patients History and Physical, chart, labs and discussed the procedure including the risks, benefits and alternatives for the proposed anesthesia with the patient or authorized representative who has indicated his/her understanding and acceptance.     Dental advisory given  Plan Discussed with: CRNA  Anesthesia Plan Comments:         Anesthesia Quick Evaluation

## 2023-03-05 NOTE — ED Notes (Signed)
Pt running tacky @ 148 informed RN Leafy Ro this is normal for pt

## 2023-03-05 NOTE — ED Triage Notes (Signed)
Patient arrived to the ED via EMS from home for left sided chest pain. Patient states pain does not radiate. Patient states chest pain been going on for 1-2 days. Patient has pacemaker and defibrillator, denies defibrillator firing. Patient c/o right foot pain, foot is swollen and red. Patient has hx stroke leaving left sided deficits. Patient A&Ox4.

## 2023-03-05 NOTE — ED Notes (Signed)
Daughter Anijah Forren [phone] would like an update asap

## 2023-03-05 NOTE — H&P (Cosign Needed Addendum)
Date: 03/05/2023               Patient Name:  [name] MRN: [id number]  DOB: [birth year] Age / Sex: 57 y.o., female   PCP: Dorethea Clan, DO         Medical Service: Internal Medicine Teaching Service         Attending Physician: Dr. Sid Falcon, MD      First Contact: Dr. Leigh Aurora, DO Pager [phone]    Second Contact: Dr. Gaylan Gerold, DO Pager [phone]         After Hours (After 5p/  First Contact Pager: [phone]  weekends / holidays): Second Contact Pager: [phone]   SUBJECTIVE   Chief Complaint: Chest pain and right leg pain  History of Present Illness:  This is a 57 year old female with a past medical history of prior stroke, type 2 diabetes, combined systolic and diastolic heart failure, hyperlipidemia, hypertension who presents to the emergency room with concerns of chest pain.  Patient reports that this chest pain started last night.  She states it came on at rest, and was sharp in nature.  It started on the left side and radiated down her left arm.  She also noted associated shortness of breath.  She states it lasts about 30 minutes, and then went away.  She states that at that time she felt lightheaded, but denied any sweating.  She states that she then walked to the living room, and sat on the couch.  She denies any exacerbation of this chest pain with walking.  She thought it was gas pain.  It lasted about 30 minutes, and then resolved.  She states that this morning the chest pain came back around 10-11 AM.  She states that she had just taken a bath and was laying down, and reported that the pain came back.  She then reported that she called the life alert button.  She states she did not feel any shocks.  Patient denied any palpitations.  She also reports having right foot pain for about 2 to 3 days.  She states that she noticed that her right foot was getting more swollen and red and only noticed this about 2 to 3 days ago.  She did not realize if the redness was  tracking up her leg.  She states that this is a burning type pain that she feels.  She does report a history of a wound in the same area when she was 57 years old after she dropped some glass on her foot.  She states that elevation helps her pain.  She denies any drainage from this.  She denies any trauma to the area.  Patient does report fevers and chills.  No measured fevers, only subjective.  She does note that she has neuropathy.  She denies walking barefoot.  She states she ambulates around with a cane, and sometimes needs a wheelchair.  ED Course: Patient initially presented to the emergency room with complaints of chest pain.  EDP did a thorough examination and found redness noted on right foot.  Patient was febrile in the emergency room to 101.6 F.  Patient was also tachycardic to the 120s.  Patient had imaging done with concerns for soft tissue swelling and gas noted to fifth MTP joint.  Labs showing leukocytosis to 16.9.  Cultures were taken, I&D performed in ED, and patient started on broad-spectrum antibiotics.  IMTS was consulted for further evaluation and management.   Past Medical  History Past Medical History:  Diagnosis Date   Acute cystitis 06/19/2021   AKI (acute kidney injury) ([place]) 09/13/2022   Allergic rhinitis    Arthritis    "hips, back, legs, arms" (07/04/2014)   Asthma    hx   Automatic implantable cardioverter-defibrillator in situ    Calcifying tendinitis of shoulder    Chronic combined systolic and diastolic CHF (congestive heart failure) ([place])    EF 40-45% by echo 12/06/2012   Chronic diastolic heart failure ([place])     Primarily diastolic CHF: Likely due to uncontrolled HTN. Last echo (8/12) with EF 45-50%, mild to moderate LVH with some asymmetric septal hypertrophy, RV normal size and systolic function. EF 50-55% by LV-gram in 6/12.    Chronic lower back pain    secondary to DJD, obsetiy, hip problems. Followed by Dr. Oval Linsey (pain management)   Chronic  systolic heart failure ([place]) 05/17/2022   Chronic use of opiate for therapeutic purpose 12/14/2016   Contact with and (suspected) exposure to covid-19 02/07/2020   Coronary artery disease    questionable. [place] 05/2011 showing normal coronaries // Followed at [facility], Dr. Aundra Dubin   Degeneration of lumbar or lumbosacral intervertebral disc    DJD (degenerative joint disease) of hip    right sided   Fatigue 04/27/2022   Frequent UTI    GERD (gastroesophageal reflux disease)    HLD (hyperlipidemia)    Hypertension    Poorly controlled. Has had HTN since age 89. Angioedema with ACEI.  24 Hr urine and renal arterial dopplers ordered . . . Never done   LBBB (left bundle branch block)    Left shoulder pain 06/30/2015   Left spastic hemiparesis ([place]) 09/23/2015   Liver disease    Lumbago 11/22/2016   Morbid obesity ([place])    Muscle spasm 06/19/2021   Need for pneumococcal vaccine 09/09/2017   NICM (nonischemic cardiomyopathy) ([place])    EF 45-50% in 8/12, cath 6/12 showed normal coronaries, EF 50-55% by LV gram   OSA on CPAP    sleep study in 8/12 showed moderate to severe OSA requiring CPAP   Perimenopausal 03/28/2017   Polyneuropathy in diabetes(357.2)    Presence of permanent cardiac pacemaker    Rash 07/14/2016   Seizures ([place])    last 3 months   Shortness of breath    none now   Stroke [facility]) 12/2013   "my left side is paralyzed" (07/04/2014)   Thoracic or lumbosacral neuritis or radiculitis, unspecified    Type II diabetes mellitus ([place]) DX: 2002   Urinary frequency 11/05/2014   Vaginal discharge 02/05/2021     Meds:  Albuterol Amlodipine 10 mg  Atovastatin 40 milligrams daily Buspirone 7.5 mg BID Cuanocobalamin 1000 mcg  Cyclobenzaprine 5 mg tID Voltaren Gel Trulicity .75 mg  weekly (not started) Vit D [id number] Flonase  Folic acid  Lasix 40 mg M/F Gabapentin 300 BID Hydralazine 25 TID Lantus 25 units  Imdur 90 mg daily Keppra 500 mg BID Linaclotide 290 mcg   Metoprol succinate 100 daily  Pantroprozole 40 mg daily  KCL 20 Meq daily  Xeralto 20 mg daily  Spironolactone 100 daily  Trazadone 50 mg daily qhs  Past Surgical History  Past Surgical History:  Procedure Laterality Date   BI-VENTRICULAR IMPLANTABLE CARDIOVERTER DEFIBRILLATOR N/A 08/22/2013   Procedure: BI-VENTRICULAR IMPLANTABLE CARDIOVERTER DEFIBRILLATOR  (CRT-D);  Surgeon: Deboraha Sprang, MD;  Location: [facility] CATH LAB;  Service: Cardiovascular;  Laterality: N/A;   BI-VENTRICULAR IMPLANTABLE CARDIOVERTER DEFIBRILLATOR  (CRT-D)  08/2013   Archie Endo 08/23/2013   BIV ICD GENERATOR CHANGEOUT N/A 03/18/2021   Procedure: BIV ICD GENERATOR CHANGEOUT;  Surgeon: Deboraha Sprang, MD;  Location: [place] CV LAB;  Service: Cardiovascular;  Laterality: N/A;   BREAST SURGERY Bilateral 2011   patient reports benign results   CARDIAC CATHETERIZATION  05/2011   CARPAL TUNNEL RELEASE Left    denies   HEMIARTHROPLASTY SHOULDER FRACTURE Right 1980's   denies   MULTIPLE EXTRACTIONS WITH ALVEOLOPLASTY Bilateral 05/20/2017   Procedure: MULTIPLE EXTRACTION;  Surgeon: Diona Browner, DDS;  Location: [place];  Service: Oral Surgery;  Laterality: Bilateral;   MULTIPLE TOOTH EXTRACTIONS  ~ 2011   tumors removed ; "my whole top"   SHOULDER ARTHROSCOPY Right 12/26/2015   Procedure: Right Shoulder Arthroscopy, Debridement, and Decompression;  Surgeon: Newt Minion, MD;  Location: [place];  Service: Orthopedics;  Laterality: Right;   TEE WITHOUT CARDIOVERSION N/A 01/14/2014   Procedure: TRANSESOPHAGEAL ECHOCARDIOGRAM (TEE);  Surgeon: Sanda Klein, MD;  Location: [facility] ENDOSCOPY;  Service: Cardiovascular;  Laterality: N/A;   TUBAL LIGATION  05/31/1985    Social:  Lives With: Roommate named Jeneen Rinks Occupation: Retired [employer]arry manager Support: Dependent on home health aide and roommate Jeneen Rinks for most things, makes her own decisions Level of Function: Dependent on home health aide and roommate Jeneen Rinks in most ADLs PCP: Dr. Buddy Duty   Substances: N/A  Family History:  Mother: heart disease, kidney disease, congestive heart failure Father: Heart disease, diabetes, glaucoma Brother: Diabetes Brother: Heart disease  Allergies: Allergies as of 03/05/2023 - Review Complete 03/05/2023  Allergen Reaction Noted   Ace inhibitors Anaphylaxis and Other (See Comments) 07/04/2014   Cleocin [clindamycin] Anaphylaxis and Swelling    Enalapril maleate Anaphylaxis and Other (See Comments)    Zestril [lisinopril] Anaphylaxis and Other (See Comments)     Review of Systems: A complete ROS was negative except as per HPI.   OBJECTIVE:   Physical Exam: Blood pressure (!) 192/97, pulse (!) 117, temperature (!) 101.6 F (38.7 C), temperature source Oral, resp. rate 18, height 5\' 9"  (1.753 m), weight 99.8 kg, last menstrual period 09/13/2019, SpO2 96 %.  Constitutional: Resting in bed, no acute distress HENT: normocephalic atraumatic Eyes: conjunctiva non-erythematous Neck: supple, no JVD appreciated Cardiovascular: Tachycardic, no murmurs, rubs, or gallops Pulmonary/Chest: normal work of breathing on room air, lungs clear to auscultation bilaterally Abdominal: soft, non-tender, non-distended, normoactive bowel sounds  Extremities: Left upper and lower extremity with no movement.  Sensation intact bilaterally.  Right lower extremity with noticeable edema and erythema at dorsal aspect of foot.  Open wound with bloody drainage noted.  Warmth noted compared to left extremity. Erythema appreciated to knee on right lower extremity. Skin: Erythema and warmth noted to right lower extremity extending up to right knee  Labs: CBC    Component Value Date/Time   WBC 16.9 (H) 03/05/2023 1626   RBC 4.02 03/05/2023 1626   HGB 10.9 (L) 03/05/2023 1626   HGB 13.6 04/27/2022 1130   HCT 33.0 (L) 03/05/2023 1626   HCT 42.3 04/27/2022 1130   PLT 211 03/05/2023 1626   PLT 297 04/27/2022 1130   MCV 82.1 03/05/2023 1626   MCV 83 04/27/2022 1130    MCH 27.1 03/05/2023 1626   MCHC 33.0 03/05/2023 1626   RDW 12.7 03/05/2023 1626   RDW 12.5 04/27/2022 1130   LYMPHSABS 3.9 09/13/2022 0530   LYMPHSABS 2.3 04/27/2022 1130   MONOABS 1.0 09/13/2022 0530   EOSABS 0.1 09/13/2022 0530  EOSABS 0.1 04/27/2022 1130   BASOSABS 0.0 09/13/2022 0530   BASOSABS 0.1 04/27/2022 1130     CMP     Component Value Date/Time   NA 133 (L) 03/05/2023 1517   NA 130 (L) 04/27/2022 1130   K 3.2 (L) 03/05/2023 1517   CL 94 (L) 03/05/2023 1517   CO2 25 03/05/2023 1517   GLUCOSE 277 (H) 03/05/2023 1517   BUN 7 03/05/2023 1517   BUN 13 04/27/2022 1130   CREATININE 0.84 03/05/2023 1517   CREATININE 0.69 07/03/2014 1524   CALCIUM 9.0 03/05/2023 1517   PROT 7.9 09/12/2022 0415   ALBUMIN 2.5 (L) 09/12/2022 0415   AST 15 09/12/2022 0415   ALT 12 09/12/2022 0415   ALKPHOS 126 09/12/2022 0415   BILITOT 0.5 09/12/2022 0415   GFRNONAA >60 03/05/2023 1517   GFRNONAA >89 07/03/2014 1524   GFRAA 115 09/12/2019 1623   GFRAA >89 07/03/2014 1524    Imaging:  Chest x-ray: No acute abnormalities  Right foot x-ray: No acute osseous abnormalities.  Large area of soft tissue gas and soft tissue swelling at the lateral margin of the fifth MTP joint with several associated radiopaque foreign bodies with suspicion of glass fragments.  EKG: personally reviewed my interpretation is paced rhythm tachycardia.  No acute ST segment changes appreciated.  Right axis.  Unchanged from previous.  ASSESSMENT & PLAN:   Assessment & Plan by Problem: Principal Problem:   Sepsis ([place]) Active Problems:   Benign essential HTN   Chronic combined systolic and diastolic heart failure (HCC)   ICD (implantable cardioverter-defibrillator) in place   Hemiparesis affecting left side as late effect of cerebrovascular accident [facility])   Poorly controlled type 2 diabetes mellitus with neuropathy ([place])   Atrial fibrillation ([place])   Diabetic foot infection ([place])   DIELLA HERMANNS is  a 57 y.o. person living with a history of type 2 diabetes, HFrEF, atrial fibrillation who presents for chest pain.  Patient found to have right foot erythema and swelling concerning for cellulitis.  Patient was SIRS positive with likely source of infection from foot wound.  Patient admitted for further evaluation and management of sepsis likely secondary to diabetic foot wound of right foot.  #Sepsis secondary to diabetic foot wound of right foot #Leukocytosis, likely secondary to above Patient concern of right foot swelling and erythema.  2 to 3-day history.  She denied any wounds prior to this.  Patient did not recognize this at first.  On exam, I appreciate significant erythema, edema, and warmth concerning for cellulitis.  Patient denied any trauma to the area.  She denies any initial wound that she could appreciate.  Likely uncontrolled diabetes contributing to worsening infection.  Patient was tachycardic with elevated white count concerning for sepsis.  While in emergency room, patient did speak with orthopedic surgery, who will evaluate the patient tomorrow for potential washout.  Patient did have I&D performed in ED.  Imaging concerning for some gas. Concern for necrotizing fasciitis.  Blood cultures taken.  Broad-spectrum antibiotics started.  Patient cannot get MRI given incompatible pacer, will obtain CT of right foot. -Follow cultures -Broad-spectrum antibiotics with vancomycin and Zosyn day 1 -Continue to monitor fever curve -Monitor white count -Ortho following, appreciate recs -Pain control with Tylenol -Follow CT scan of right foot -ABIs pending  #Atypical chest pain Patient presented for chest pain.  From description sounds like atypical chest pain.  Not concern for ACS.  Trops are negative. EKG did not show any  suspicion for ischemic changes.  On exam, patient did have reproducible chest pain.  Likely musculoskeletal.  Will continue to monitor while inpatient -Offer lidocaine  patch  #Pseudohyponatremia  #Hypokalemia  Patient presented with sodium 133.  Corrects fine likely pseudohyponatremia secondary hyperglycemia.  Hypokalemic at 3.2.  Will replete and recheck in the morning.  Will also obtain magnesium in the morning. -Replete and recheck in a.m. -Mag pending  #CAD #HFrEF Patient has past medical history of HFrEF.  Most recent EF 25 -30% 09/13/2022. ACI/ARBs per charting casue the patient angioedema.  Home medications include Lasix 40 mg Monday/Friday, hydralazine 25 mg 3 times daily, Imdur 90 mg daily, metoprolol succinate 100 mg daily.  On exam, patient does not seem volume overloaded.  No JVD appreciated.  No shortness of breath.  No crackles noted. -Resume home metoprolol succinate 100 mg daily -Resume home Imdur 90 mg daily -Resume home spironolactone 100 mg daily -Hold home hydralazine 25 mg 3 times daily -Continue home Lipitor 40 mg daily -Continue to monitor volume status -Strict I's and O's -Daily weights  #Atrial fibrillation Patient has history of atrial fibrillation.  Patient is currently paced.  Patient does have elevated heart rate, but not in A-fib.  Home medication includes metoprolol succinate 1 mg daily as well as Xarelto 20 mg daily.  Holding Xarelto in anticipation of possible surgery. -Continue metoprolol 100 mg daily -Continue on telemetry  #Hypertension Patient has a past medical history of hypertension.  Patient is currently hypertensive.  Will resume home medications. -Resume home medications including amlodipine, Imdur, metoprolol succinate, spironolactone -Hold home hydralazine 25 mg 3 times daily -Monitor blood pressure closely  #Type II Diabetes Mellitus  Patient with past medical history of type 2 diabetes mellitus.  09/13/2022 A1c was 13.3. Bmp showing glucose of 277 on arriaval.  Will recheck A1c. -A1c pending -Given patient will be n.p.o. at midnight, will resume 17 units of Levemir -Continue to monitor CBGs -Sliding  scale insulin  #Anxiety and depression No acute concerns. -Resume home Lexapro 10 mg daily -Resume home BuSpar 7.5 mg twice daily  #Seizure disorder Patient has history of seizures.  Patient takes Keppra 500 mg twice daily.  She states that she has not had a seizure in over 3 years. -Continue Keppra 5 mg twice daily  Diet: NPO effective midnight VTE: SCDs IVF: None Code: Full  Prior to Admission Living Arrangement: Home, living with a roommate Anticipated Discharge Location: Home Barriers to Discharge: Clinical improvement  Dispo: Admit patient to Inpatient with expected length of stay greater than 2 midnights.  Signed: Leigh Aurora, DO Internal Medicine Resident PGY-1 Pager: [phone] 03/05/2023, 7:20 PM   On weekends or after 5pm please page on call intern or resident: First contact: [phone] If no answer in 15 minutes, please contact senior pager at [phone]

## 2023-03-05 NOTE — ED Provider Notes (Signed)
[facility] Provider Note   CSN: [id number] Arrival date & time: 03/05/23  1407     History  Chief Complaint  Patient presents with   Chest Pain    [name] is a 57 y.o. female.  57 yo F with a chief complaints of left-sided chest pain.  Going on for couple days.  She went waiting till Monday to see her doctor but felt it got worse and decided come in.  She has been more uncomfortable because she had trouble getting up due to pain to the right leg.  It has also been bothering her for a few days now.  Denies injury to the area initially.  It is red and swollen and she told me that she has had a little bit of a fever in the past day or so.     Chest Pain      Home Medications Prior to Admission medications   Medication Sig Start Date End Date Taking? Authorizing Provider  Accu-Chek FastClix Lancets MISC USE TO CHECK BLOOD SUGAR TWICE DAILY 12/31/22   Atway, Rayann N, DO  ACCU-CHEK GUIDE test strip USE TO CHECK BLOOD SUGAR TWICE DAILY 01/23/23   Atway, Rayann N, DO  albuterol (VENTOLIN HFA) 108 (90 Base) MCG/ACT inhaler Inhale 2 puffs into the lungs every 6 (six) hours as needed for wheezing or shortness of breath. 11/18/22   Atway, Rayann N, DO  amLODipine (NORVASC) 10 MG tablet Take 1 tablet (10 mg total) by mouth daily. 11/18/22   Atway, Rayann N, DO  atorvastatin (LIPITOR) 40 MG tablet Take 1 tablet (40 mg total) by mouth daily. 11/18/22   Atway, Jeananne Rama, DO  Blood Glucose Monitoring Suppl (ACCU-CHEK GUIDE) w/Device KIT Use As Directed 09/15/22   Rick Duff, MD  busPIRone (BUSPAR) 7.5 MG tablet Take 1 tablet (7.5 mg total) by mouth 2 (two) times daily. 01/20/23 01/20/24  Nwoko, Isidoro Donning E, PA  COMFORT EZ PEN NEEDLES 31G X 8 MM MISC USE TO INJECT INSULIN IN THE MORNING, NOON, EVENING, AND BEDTIME 01/11/23   Atway, Rayann N, DO  cyanocobalamin 1000 MCG tablet Take 1 tablet by mouth daily. 02/06/19   [provider]   cyclobenzaprine (FLEXERIL) 5 MG tablet TAKE ONE TABLET BY MOUTH THREE TIMES DAILY AS NEEDED FOR MUSCLE SPASMS Patient taking differently: Take 5 mg by mouth 3 (three) times daily as needed for muscle spasms. 09/02/22   [name], MD  diclofenac Sodium (VOLTAREN) 1 % GEL APPLY 4 GRAMS TOPICALLY TO LEFT KNEE FOUR TIMES DAILY. Patient taking differently: Apply 4 g topically 4 (four) times daily. Left knee 01/12/22   [name], MD  Dulaglutide (TRULICITY) [id number] [id number] SOPN Inject 0.75 mg into the skin once a week. 11/18/22   Atway, Rayann N, DO  escitalopram (LEXAPRO) 10 MG tablet Take 1 tablet (10 mg total) by mouth daily. 01/20/23   Nwoko, Isidoro Donning E, PA  fluticasone (CUTIVATE) 0.05 % cream Apply 1 Application topically 2 (two) times daily. 01/05/23   Atway, Jeananne Rama, DO  folic acid (FOLVITE) 1 MG tablet Take 1 tablet (1 mg total) by mouth daily. 02/10/23   Atway, Rayann N, DO  furosemide (LASIX) 40 MG tablet TAKE ONE TABLET BY MOUTH DAILY Patient taking differently: Take 40 mg by mouth 2 (two) times a week. 06/22/22   Larey Dresser, MD  gabapentin (NEURONTIN) 300 MG capsule Take 1 capsule (300 mg total) by mouth 2 (two) times daily. 11/17/22  Atway, Rayann N, DO  hydrALAZINE (APRESOLINE) 25 MG tablet Take 1 tablet (25 mg total) by mouth 3 (three) times daily. 11/18/22   Atway, Rayann N, DO  isosorbide mononitrate (IMDUR) 60 MG 24 hr tablet Take 1.5 tablets (90 mg total) by mouth daily. 02/03/23   Larey Dresser, MD  KLOR-CON M20 20 MEQ tablet TAKE ONE TABLET BY MOUTH DAILY 06/22/22   Larey Dresser, MD  LANTUS SOLOSTAR 100 UNIT/ML Solostar Pen INJECT 25 UNITS INTO THE SKIN EVERY DAY 01/07/23   Atway, Rayann N, DO  levETIRAcetam (KEPPRA) 500 MG tablet TAKE ONE TABLET BY MOUTH TWICE DAILY 02/11/23   Atway, Rayann N, DO  LINZESS 290 MCG CAPS capsule TAKE ONE CAPSULE BY MOUTH EVERY DAY AS NEEDED FOR CONSTIPATION 02/07/23   Atway, Rayann N, DO  liver oil-zinc oxide (DESITIN) 40 % ointment Apply  topically every 4 (four) hours. 09/15/22   Rick Duff, MD  metoprolol succinate (TOPROL-XL) 100 MG 24 hr tablet TAKE ONE TABLET BY MOUTH EVERY DAY. (NEEDS OFFICE VISIT FOR FURTHER REFILLS) CALL [phone] TO SCHEDULE 01/15/23   Atway, Rayann N, DO  pantoprazole (PROTONIX) 40 MG tablet TAKE ONE TABLET BY MOUTH EVERY DAY Patient taking differently: Take 40 mg by mouth daily. 03/09/22   [name], MD  spironolactone (ALDACTONE) 100 MG tablet TAKE ONE TABLET BY MOUTH EVERY DAY 02/07/23   Atway, Rayann N, DO  STOOL SOFTENER/LAXATIVE 50-8.6 MG tablet TAKE ONE TABLET BY MOUTH AT BEDTIME AS NEEDED FOR MILD CONSTIPATION Patient taking differently: Take 1 tablet by mouth at bedtime as needed for mild constipation. 01/28/20   Velna Ochs, MD  traZODone (DESYREL) 50 MG tablet Take 1 tablet (50 mg total) by mouth at bedtime. 01/20/23   Nwoko, Terese Door, PA  Vitamin D, Ergocalciferol, (DRISDOL) 1.25 MG (50000 UNIT) CAPS capsule TAKE ONE CAPSULE BY MOUTH EVERY 7 DAYS 01/24/23   Atway, Rayann N, DO  XARELTO 20 MG TABS tablet TAKE ONE TABLET BY MOUTH EVERY DAY WITH SUPPER 01/05/23   Atway, Rayann N, DO      Allergies    Ace inhibitors, Cleocin [clindamycin], Enalapril maleate, and Zestril [lisinopril]    Review of Systems   Review of Systems  Cardiovascular:  Positive for chest pain.    Physical Exam Updated Vital Signs BP (!) 145/95   Pulse (!) 107   Temp 99.1 F (37.3 C) (Oral)   Resp (!) 24   Ht 5\' 9"  (1.753 m)   Wt 99.8 kg   LMP 09/13/2019 (Exact Date)   SpO2 92%   BMI 32.49 kg/m  Physical Exam Vitals and nursing note reviewed.  Constitutional:      General: She is not in acute distress.    Appearance: She is well-developed. She is not diaphoretic.  HENT:     Head: Normocephalic and atraumatic.  Eyes:     Pupils: Pupils are equal, round, and reactive to light.  Cardiovascular:     Rate and Rhythm: Normal rate and regular rhythm.     Heart sounds: No murmur heard.    No  friction rub. No gallop.  Pulmonary:     Effort: Pulmonary effort is normal.     Breath sounds: No wheezing or rales.  Abdominal:     General: There is no distension.     Palpations: Abdomen is soft.     Tenderness: There is no abdominal tenderness.  Musculoskeletal:        General: No tenderness.     Cervical  back: Normal range of motion and neck supple.     Comments: Redness and warmth to the right foot that extends up to the right knee.  She has what looks like an abscess about the lateral aspect of the right foot along the MTP.  Intact pulse.  She has pain on the left lateral chest wall that reproduces her discomfort.  Skin:    General: Skin is warm and dry.  Neurological:     Mental Status: She is alert and oriented to person, place, and time.  Psychiatric:        Behavior: Behavior normal.     ED Results / Procedures / Treatments   Labs (all labs ordered are listed, but only abnormal results are displayed) Labs Reviewed  BASIC METABOLIC PANEL - Abnormal; Notable for the following components:      Result Value   Sodium 133 (*)    Potassium 3.2 (*)    Chloride 94 (*)    Glucose, Bld 277 (*)    All other components within normal limits  CBC - Abnormal; Notable for the following components:   WBC 16.9 (*)    Hemoglobin 10.9 (*)    HCT 33.0 (*)    All other components within normal limits  CULTURE, BLOOD (ROUTINE X 2)  CULTURE, BLOOD (ROUTINE X 2)  LACTIC ACID, PLASMA  LACTIC ACID, PLASMA  HEMOGLOBIN [id number]  BASIC METABOLIC PANEL  CBC  MAGNESIUM  TROPONIN I (HIGH SENSITIVITY)  TROPONIN I (HIGH SENSITIVITY)    EKG EKG Interpretation  Date/Time:  Saturday March 05 2023 14:34:33 EDT Ventricular Rate:  120 PR Interval:  93 QRS Duration: 124 QT Interval:  359 QTC Calculation: 508 R Axis:   252 Text Interpretation: Sinus tachycardia Nonspecific IVCD with LAD Consider left ventricular hypertrophy Probable inferior infarct, acute Anterior Q waves, possibly due to  LVH Lateral leads are also involved No old tracing to compare Confirmed by Deno Etienne [phone]) on 03/05/2023 3:16:31 PM  Radiology CT FOOT RIGHT W CONTRAST  Result Date: 03/05/2023 CLINICAL DATA:  Diabetic osteomyelitis suspected.  Foot swelling. EXAM: CT OF THE LOWER RIGHT EXTREMITY WITH CONTRAST TECHNIQUE: Multidetector CT imaging of the lower right extremity was performed according to the standard protocol following intravenous contrast administration. RADIATION DOSE REDUCTION: This exam was performed according to the departmental dose-optimization program which includes automated exposure control, adjustment of the mA and/or kV according to patient size and/or use of iterative reconstruction technique. CONTRAST:  158mL OMNIPAQUE IOHEXOL 350 MG/ML SOLN COMPARISON:  Right foot radiograph dated 03/05/2023. FINDINGS: Bones/Joint/Cartilage There is no acute fracture or dislocation. The bones are osteopenic. No significant arthritic changes. The ankle mortise is intact. There may be small focal bone erosion of the lateral fifth metatarsal head (axial 57/5). However, no definite bone erosion seen on the other views. There is soft tissue gas adjacent to the fifth MTP joint suspicious for an infectious process. Eighth there is clinical concern for acute osteomyelitis, further evaluation with MRI or a white blood cell nuclear scan is recommended. Ligaments Suboptimally assessed by CT. Muscles and Tendons No acute findings.  No intramuscular fluid collection or hematoma. Soft tissues Soft tissue gas at the fifth MTP joint extending from the plantar to dorsal skin suspicious for gas-forming infection. There is diffuse skin thickening and subcutaneous edema of the dorsum of the foot which may represent cellulitis. No drainable fluid collection or abscess. There is skin wound of the dorsal and plantar aspect of the lateral foot at the level  of the fifth MTP joint. There is a 3 mm radiopaque fragment within the plantar skin  wound. Correlation with clinical exam recommended. IMPRESSION: 1. No acute fracture or dislocation. 2. Soft tissue gas with possible small focus of bone erosion involving the fifth metatarsal head concerning for gas-forming infection or osteomyelitis. Further evaluation with MRI or a white blood cell nuclear scan recommended. No drainable fluid collection or abscess. 3. Skin wound of the dorsal and plantar aspect of the lateral foot at the level of the fifth MTP joint. There is a 3 mm radiopaque fragment within the plantar skin wound. Correlation with clinical exam recommended. Electronically Signed   By: Anner Crete M.D.   On: 03/05/2023 19:29   DG Chest Port 1 View  Result Date: 03/05/2023 CLINICAL DATA:  Cellulitis versus abscess of foot, chest pain EXAM: PORTABLE CHEST 1 VIEW COMPARISON:  Portable exam 1422 hours compared to 09/12/2022 FINDINGS: LEFT subclavian ICD with leads projecting over RIGHT atrium, RIGHT ventricle, and coronary sinus. Upper normal heart size. Mediastinal contours and pulmonary vascularity normal. Lungs clear. No infiltrate, pleural effusion, or pneumothorax. IMPRESSION: No acute abnormalities. Electronically Signed   By: Lavonia Dana M.D.   On: 03/05/2023 14:51   DG Foot Complete Right  Result Date: 03/05/2023 CLINICAL DATA:  Chest pain, type II diabetes mellitus, foot wound, abscess versus cellulitis of RIGHT foot EXAM: RIGHT FOOT COMPLETE - 3+ VIEW COMPARISON:  None available FINDINGS: Mild osseous demineralization. Minimal degenerative changes first MTP joint. Remaining joint spaces preserved. No acute fracture or dislocation. Large focus of soft tissue gas and swelling at the lateral margin of the fifth MTP joint with several associated angular radiopaque foreign bodies, 4 mm length adjacent to distal fifth metatarsal and 4 x 2 mm at the level of the fifth MTP joint, question glass fragments. Calcaneal spurring. No definite bone destruction identified to suggest  osteomyelitis. IMPRESSION: No acute osseous abnormalities. Large area of soft tissue gas and soft tissue swelling at the lateral margin of the fifth MTP joint with several associated radiopaque foreign bodies question glass fragments. Findings are consistent with abscess/infection by gas-forming organism. No radiographic evidence of osteomyelitis though if this remains a clinical concern, consider MR assessment. Electronically Signed   By: Lavonia Dana M.D.   On: 03/05/2023 14:50    Procedures .Marland KitchenIncision and Drainage  Date/Time: 03/05/2023 3:48 PM  Performed by: Deno Etienne, DO Authorized by: Deno Etienne, DO   Consent:    Consent obtained:  Verbal   Consent given by:  Patient   Risks, benefits, and alternatives were discussed: yes     Risks discussed:  Bleeding, incomplete drainage and infection   Alternatives discussed:  No treatment, delayed treatment and alternative treatment Universal protocol:    Procedure explained and questions answered to patient or proxy's satisfaction: yes     Patient identity confirmed:  Verbally with patient Location:    Type:  Abscess   Location:  Lower extremity   Lower extremity location:  Foot   Foot location:  R foot Pre-procedure details:    Skin preparation:  Chlorhexidine Sedation:    Sedation type:  None Anesthesia:    Anesthesia method:  Local infiltration   Local anesthetic:  Lidocaine 2% WITH epi Procedure type:    Complexity:  Complex Procedure details:    Ultrasound guidance: no     Needle aspiration: no     Incision types:  Single straight   Incision depth:  Subcutaneous   Wound management:  Probed  and deloculated   Drainage:  Purulent   Drainage amount:  Copious   Packing materials:  1/4 in iodoform gauze   Amount 1/4" iodoform:  10cm Post-procedure details:    Procedure completion:  Tolerated well, no immediate complications .Critical Care  Performed by: Deno Etienne, DO Authorized by: Deno Etienne, DO   Critical care provider  statement:    Critical care time (minutes):  35   Critical care time was exclusive of:  Separately billable procedures and treating other patients   Critical care was time spent personally by me on the following activities:  Development of treatment plan with patient or surrogate, discussions with consultants, evaluation of patient's response to treatment, examination of patient, ordering and review of laboratory studies, ordering and review of radiographic studies, ordering and performing treatments and interventions, pulse oximetry, re-evaluation of patient's condition and review of old charts   Care discussed with: admitting provider       Medications Ordered in ED Medications  vancomycin (VANCOREADY) IVPB 2000 mg/400 mL (2,000 mg Intravenous New Bag/Given 03/05/23 1847)  amLODipine (NORVASC) tablet 10 mg (10 mg Oral Given 03/05/23 1813)  atorvastatin (LIPITOR) tablet 40 mg (has no administration in time range)  furosemide (LASIX) tablet 40 mg (has no administration in time range)  isosorbide mononitrate (IMDUR) 24 hr tablet 90 mg (90 mg Oral Given 03/05/23 1813)  metoprolol succinate (TOPROL-XL) 24 hr tablet 100 mg (100 mg Oral Given 03/05/23 1811)  spironolactone (ALDACTONE) tablet 100 mg (100 mg Oral Given 03/05/23 1812)  busPIRone (BUSPAR) tablet 7.5 mg (has no administration in time range)  escitalopram (LEXAPRO) tablet 10 mg (has no administration in time range)  traZODone (DESYREL) tablet 50 mg (has no administration in time range)  levETIRAcetam (KEPPRA) tablet 500 mg (has no administration in time range)  gabapentin (NEURONTIN) capsule 300 mg (has no administration in time range)  acetaminophen (TYLENOL) tablet 650 mg (650 mg Oral Given 03/05/23 1811)    Or  acetaminophen (TYLENOL) suppository 650 mg ( Rectal See Alternative 03/05/23 1811)  oxyCODONE (Oxy IR/ROXICODONE) immediate release tablet 5 mg (has no administration in time range)  polyethylene glycol (MIRALAX / GLYCOLAX) packet  17 g (has no administration in time range)  insulin aspart (novoLOG) injection 0-15 Units (has no administration in time range)  piperacillin-tazobactam (ZOSYN) IVPB 3.375 g (has no administration in time range)  lidocaine (LIDODERM) 5 % 1 patch (1 patch Transdermal Patch Applied 03/05/23 1929)  insulin detemir (LEVEMIR) injection 17 Units (has no administration in time range)  vancomycin (VANCOREADY) IVPB 1500 mg/300 mL (has no administration in time range)  lidocaine-EPINEPHrine (XYLOCAINE W/EPI) 2 %-1:200000 (PF) injection 10 mL (10 mLs Intradermal Given 03/05/23 1549)  piperacillin-tazobactam (ZOSYN) IVPB 3.375 g (0 g Intravenous Stopped 03/05/23 1631)  potassium chloride SA (KLOR-CON M) CR tablet 40 mEq (40 mEq Oral Given 03/05/23 1813)  iohexol (OMNIPAQUE) 350 MG/ML injection 100 mL (100 mLs Intravenous Contrast Given 03/05/23 1900)    ED Course/ Medical Decision Making/ A&P                             Medical Decision Making Amount and/or Complexity of Data Reviewed Labs: ordered. Radiology: ordered.  Risk Prescription drug management. Decision regarding hospitalization.   57 yo F with a chief complaints of left-sided chest pain.  This is atypical in nature and reproduced on exam.  I am more concerned about her right leg, looks like she has sepsis with  cellulitis with spreading up the leg.  Has had fevers at home.  Tachycardic on arrival here.  Blood work chest x-ray EKG.  Reassess  Plain film of the foot concerning for gas-forming organism.  I discussed case with orthopedics, Dr. Percell Miller.  As I described on my physical exam of it being very superficial he did feel reasonable for I&D at bedside.  Will follow the patient along in the hospital and if worsens would consider taken to the OR.  Awaiting blood work.  Leukocytosis, lactic acid negative.  Trop negative.  Discussed with medicine for admission.   The patients results and plan were reviewed and discussed.   Any x-rays  performed were independently reviewed by myself.   Differential diagnosis were considered with the presenting HPI.  Medications  vancomycin (VANCOREADY) IVPB 2000 mg/400 mL (2,000 mg Intravenous New Bag/Given 03/05/23 1847)  amLODipine (NORVASC) tablet 10 mg (10 mg Oral Given 03/05/23 1813)  atorvastatin (LIPITOR) tablet 40 mg (has no administration in time range)  furosemide (LASIX) tablet 40 mg (has no administration in time range)  isosorbide mononitrate (IMDUR) 24 hr tablet 90 mg (90 mg Oral Given 03/05/23 1813)  metoprolol succinate (TOPROL-XL) 24 hr tablet 100 mg (100 mg Oral Given 03/05/23 1811)  spironolactone (ALDACTONE) tablet 100 mg (100 mg Oral Given 03/05/23 1812)  busPIRone (BUSPAR) tablet 7.5 mg (has no administration in time range)  escitalopram (LEXAPRO) tablet 10 mg (has no administration in time range)  traZODone (DESYREL) tablet 50 mg (has no administration in time range)  levETIRAcetam (KEPPRA) tablet 500 mg (has no administration in time range)  gabapentin (NEURONTIN) capsule 300 mg (has no administration in time range)  acetaminophen (TYLENOL) tablet 650 mg (650 mg Oral Given 03/05/23 1811)    Or  acetaminophen (TYLENOL) suppository 650 mg ( Rectal See Alternative 03/05/23 1811)  oxyCODONE (Oxy IR/ROXICODONE) immediate release tablet 5 mg (has no administration in time range)  polyethylene glycol (MIRALAX / GLYCOLAX) packet 17 g (has no administration in time range)  insulin aspart (novoLOG) injection 0-15 Units (has no administration in time range)  piperacillin-tazobactam (ZOSYN) IVPB 3.375 g (has no administration in time range)  lidocaine (LIDODERM) 5 % 1 patch (1 patch Transdermal Patch Applied 03/05/23 1929)  insulin detemir (LEVEMIR) injection 17 Units (has no administration in time range)  vancomycin (VANCOREADY) IVPB 1500 mg/300 mL (has no administration in time range)  lidocaine-EPINEPHrine (XYLOCAINE W/EPI) 2 %-1:200000 (PF) injection 10 mL (10 mLs Intradermal  Given 03/05/23 1549)  piperacillin-tazobactam (ZOSYN) IVPB 3.375 g (0 g Intravenous Stopped 03/05/23 1631)  potassium chloride SA (KLOR-CON M) CR tablet 40 mEq (40 mEq Oral Given 03/05/23 1813)  iohexol (OMNIPAQUE) 350 MG/ML injection 100 mL (100 mLs Intravenous Contrast Given 03/05/23 1900)    Vitals:   03/05/23 1630 03/05/23 1800 03/05/23 1854 03/05/23 1915  BP: (!) 157/90 (!) 148/84  (!) 145/95  Pulse: (!) 119 (!) 111  (!) 107  Resp: 19 15  (!) 24  Temp:   99.1 F (37.3 C)   TempSrc:   Oral   SpO2: 97% 98%  92%  Weight:      Height:        Final diagnoses:  Foot abscess, right  Cellulitis of right lower extremity    Admission/ observation were discussed with the admitting physician, patient and/or family and they are comfortable with the plan.          Final Clinical Impression(s) / ED Diagnoses Final diagnoses:  Foot abscess, right  Cellulitis  of right lower extremity    Rx / DC Orders ED Discharge Orders     None         Deno Etienne, DO 03/05/23 1938

## 2023-03-05 NOTE — Hospital Course (Addendum)
[name] is a 57 y.o. person living with a history of type 2 diabetes, HFrEF, atrial fibrillation who presents for chest pain.  Patient found to have right foot erythema and swelling concerning for cellulitis.  Patient was SIRS positive with likely source of infection from foot wound.  Patient admitted for further evaluation and management of sepsis likely secondary to diabetic foot wound of right foot.   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  She states that her pain is better.  On exam, patient is improving on the lower extremity.  Patient has good sensation.  Erythema has resolved.  Right lower extremity wrapped in Ace bandage.  ABIs did not show any significant arterial disease. Cultures growing Staphylococcus Lugdunensis. Will await sensitivities.  Patient remains afebrile.  White count stable.  Ortho concerned that patient may need further washout.  Will await further Ortho recommendations.  Will continue with Zosyn and linezolid until sensitivities return.  After sensitivities return, will likely consult ID for further long-term management. -Zosyn day 5 -Linezolid day 5 -Multimodal pain control with Tylenol and oxycodone -Continue to follow cultures for sensitivities -Ortho following, recommending further washout likely tomorrow  -Monitor white count and fever curve -Will need OPAT orders placed once sensitivities return   #Type II Diabetes Mellitus  Patient remains on 30 units long-acting insulin.  Fasting glucose this morning is 167.  Glucose ranging between 122-288. Patient will be n.p.o. at midnight, and likely will need further titration of insulin. -Continue 30 units of insulin -Increase sliding scale to resistant  -Continue to monitor CBGs -Goal blood glucose level less than 180   #CAD #HFrEF No concern for acute exacerbation at this time.  Patient remains  hemodynamically stable.  Continue home medications except for hydralazine 25 mg 3 times daily. -Continue home metoprolol succinate 100 mg daily -Continue home Imdur 90 mg daily -Continue home spironolactone 100 mg daily -Hold home hydralazine 25 mg 3 times daily -Continue home Lipitor 40 mg daily -Continue Lasix 40 mg Monday and Thursdays   #Atrial fibrillation Patient remains rate controlled.  Given patient will need procedure, we will hold on resuming Xarelto.  Will continue with metoprolol 100 mg daily.   -Continue metoprolol succinate 100 mg daily -Hold Xarelto 20 mg daily -Continue telemetry    #Hypertension Blood pressure remains normotensive currently. -Continue home amlodipine, Imdur, metoprolol succinate, and spironolactone.   -Continue with Lasix Monday and Thursday -Hold hydralazine 25 mg 3 times daily   #Anxiety and depression No acute concerns -Continue home Lexapro 10 mg daily -Continue home BuSpar 7.5 mg twice daily   #Seizure disorder No acute concerns, patient remains seizure-free -Continue Keppra 500 mg twice daily   3/28 Chest feels heavy since last night Says sweaty and lightheaded Didn't put a patch on her last night, none now No sob, mainly asleep but woke her up Lasted less than an hour No radiation into the neck or down the arm Substernal No meds given No tachy or palpitations Trying to get up, called 2-3 times no help yet Butt is sore Eye sight getting blurry, glasses aren't helping, time to get eyes checked Legt is good Put boot on and helped

## 2023-03-05 NOTE — Progress Notes (Addendum)
Pharmacy Antibiotic Note  [name] is a 57 y.o. female admitted on 03/05/2023 with  wound infection .  Pharmacy has been consulted for Zosyn dosing.  Plan: Zosyn 3.375g IV q8h (4 hour infusion). Originally started vancomycin then transition to Linezolid 600mg  Q12H per MD.  Follow culture data for de-escalation.  Monitor renal function for dose adjustments as indicated.    Height: 5\' 9"  (175.3 cm) Weight: 99.8 kg (220 lb) IBW/kg (Calculated) : 66.2  Temp (24hrs), Avg:101.6 F (38.7 C), Min:101.6 F (38.7 C), Max:101.6 F (38.7 C)  Recent Labs  Lab 03/05/23 1517 03/05/23 1626  WBC  --  16.9*  CREATININE 0.84  --   LATICACIDVEN 1.4 1.1    Estimated Creatinine Clearance: 94 mL/min (by C-G formula based on SCr of 0.84 mg/dL).    Allergies  Allergen Reactions   Ace Inhibitors Anaphylaxis and Other (See Comments)    Swelling of the tongue and throat    Cleocin [Clindamycin] Anaphylaxis and Swelling   Enalapril Maleate Anaphylaxis and Other (See Comments)    Swelling of the tongue and throat    Zestril [Lisinopril] Anaphylaxis and Other (See Comments)    Swelling of the tongue and throat     Thank you for allowing pharmacy to be a part of this patient's care.  [name], PharmD, BCCCP  03/05/2023 6:17 PM

## 2023-03-05 NOTE — ED Notes (Signed)
ED TO INPATIENT HANDOFF REPORT  ED Nurse Name and Phone #: Lovett Calender Name/Age/Gender [name] 57 y.o. female Room/Bed: 001C/001C  Code Status   Code Status: Full Code  Home/SNF/Other Home Patient oriented to: self, place, time, and situation Is this baseline? Yes   Triage Complete: Triage complete  Chief Complaint Diabetic foot infection ([place]) [id number], L08.9]  Triage Note Patient arrived to the ED via EMS from home for left sided chest pain. Patient states pain does not radiate. Patient states chest pain been going on for 1-2 days. Patient has pacemaker and defibrillator, denies defibrillator firing. Patient c/o right foot pain, foot is swollen and red. Patient has hx stroke leaving left sided deficits. Patient A&Ox4.   Allergies Allergies  Allergen Reactions   Ace Inhibitors Anaphylaxis and Other (See Comments)    Swelling of the tongue and throat    Cleocin [Clindamycin] Anaphylaxis and Swelling   Enalapril Maleate Anaphylaxis and Other (See Comments)    Swelling of the tongue and throat    Zestril [Lisinopril] Anaphylaxis and Other (See Comments)    Swelling of the tongue and throat     Level of Care/Admitting Diagnosis ED Disposition     ED Disposition  Admit   Condition  --   [place]: [place] [[id number]]  Level of Care: Telemetry Medical [104]  May place patient in observation at [facility] or [place] if equivalent level of care is available:: No  Covid Evaluation: Asymptomatic - no recent exposure (last 10 days) testing not required  Diagnosis: Diabetic foot infection [facility]) [id number]  Admitting Physician: Sid Falcon [id number]  Attending Physician: Cresenciano Lick          B Medical/Surgery History Past Medical History:  Diagnosis Date   Acute cystitis 06/19/2021   AKI (acute kidney injury) ([place]) 09/13/2022   Allergic rhinitis    Arthritis    "hips, back, legs, arms" (07/04/2014)    Asthma    hx   Automatic implantable cardioverter-defibrillator in situ    Calcifying tendinitis of shoulder    Chronic combined systolic and diastolic CHF (congestive heart failure) ([place])    EF 40-45% by echo 12/06/2012   Chronic diastolic heart failure ([place])     Primarily diastolic CHF: Likely due to uncontrolled HTN. Last echo (8/12) with EF 45-50%, mild to moderate LVH with some asymmetric septal hypertrophy, RV normal size and systolic function. EF 50-55% by LV-gram in 6/12.    Chronic lower back pain    secondary to DJD, obsetiy, hip problems. Followed by Dr. Oval Linsey (pain management)   Chronic systolic heart failure ([place]) 05/17/2022   Chronic use of opiate for therapeutic purpose 12/14/2016   Contact with and (suspected) exposure to covid-19 02/07/2020   Coronary artery disease    questionable. [place] 05/2011 showing normal coronaries // [facility] at [facility], Dr. Aundra Dubin   Degeneration of lumbar or lumbosacral intervertebral disc    DJD (degenerative joint disease) of hip    right sided   Fatigue 04/27/2022   Frequent UTI    GERD (gastroesophageal reflux disease)    HLD (hyperlipidemia)    Hypertension    Poorly controlled. Has had HTN since age 63. Angioedema with ACEI.  24 Hr urine and renal arterial dopplers ordered . . . 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   Morbid obesity ([place])    Muscle spasm 06/19/2021   Need for pneumococcal vaccine 09/09/2017   NICM (nonischemic cardiomyopathy) ([place])    EF 45-50% in 8/12, cath 6/12 showed normal coronaries, EF 50-55% by LV gram   OSA on CPAP    sleep study in 8/12 showed moderate to severe OSA requiring CPAP   Perimenopausal 03/28/2017   Polyneuropathy in diabetes(357.2)    Presence of permanent cardiac pacemaker    Rash 07/14/2016   Seizures ([place])    last 3 months   Shortness of breath     none now   Stroke [facility]) 12/2013   "my left side is paralyzed" (07/04/2014)   Thoracic or lumbosacral neuritis or radiculitis, unspecified    Type II diabetes mellitus ([place]) DX: 2002   Urinary frequency 11/05/2014   Vaginal discharge 02/05/2021   Past Surgical History:  Procedure Laterality Date   BI-VENTRICULAR IMPLANTABLE CARDIOVERTER DEFIBRILLATOR N/A 08/22/2013   Procedure: BI-VENTRICULAR IMPLANTABLE CARDIOVERTER DEFIBRILLATOR  (CRT-D);  Surgeon: Deboraha Sprang, MD;  Location: [facility] CATH LAB;  Service: Cardiovascular;  Laterality: N/A;   BI-VENTRICULAR IMPLANTABLE CARDIOVERTER DEFIBRILLATOR  (CRT-D)  08/2013   Archie Endo 08/23/2013   BIV ICD GENERATOR CHANGEOUT N/A 03/18/2021   Procedure: BIV ICD GENERATOR CHANGEOUT;  Surgeon: Deboraha Sprang, MD;  Location: [place] CV LAB;  Service: Cardiovascular;  Laterality: N/A;   BREAST SURGERY Bilateral 2011   patient reports benign results   CARDIAC CATHETERIZATION  05/2011   CARPAL TUNNEL RELEASE Left    denies   HEMIARTHROPLASTY SHOULDER FRACTURE Right 1980's   denies   MULTIPLE EXTRACTIONS WITH ALVEOLOPLASTY Bilateral 05/20/2017   Procedure: MULTIPLE EXTRACTION;  Surgeon: Diona Browner, DDS;  Location: [place];  Service: Oral Surgery;  Laterality: Bilateral;   MULTIPLE TOOTH EXTRACTIONS  ~ 2011   tumors removed ; "my whole top"   SHOULDER ARTHROSCOPY Right 12/26/2015   Procedure: Right Shoulder Arthroscopy, Debridement, and Decompression;  Surgeon: Newt Minion, MD;  Location: [place];  Service: Orthopedics;  Laterality: Right;   TEE WITHOUT CARDIOVERSION N/A 01/14/2014   Procedure: TRANSESOPHAGEAL ECHOCARDIOGRAM (TEE);  Surgeon: Sanda Klein, MD;  Location: [place];  Service: Cardiovascular;  Laterality: N/A;   TUBAL LIGATION  05/31/1985     A IV Location/Drains/Wounds Patient Lines/Drains/Airways Status     Active Line/Drains/Airways     Name Placement date Placement time Site Days   Arterial Line 12/26/15 Left Radial 12/26/15  1226   Radial  2626   Peripheral IV --  --  --  --   Peripheral IV 20 G Right Antecubital --  --  Antecubital  --   Urethral Catheter Maria NT Latex 14 Fr. 09/14/22  1635  Latex  172   Incision 08/22/13 Chest Left 08/22/13  1600  -- 3482   Incision (Closed) 12/26/15 Shoulder Right 12/26/15  1156  -- 2626   Pressure Injury 09/13/22 Heel Left Deep Tissue Pressure Injury - Purple or maroon localized area of discolored intact skin or blood-filled blister due to damage of underlying soft tissue from pressure and/or shear. 09/13/22  1015  -- 173   Pressure Injury 09/13/22 Elbow Left;Posterior Deep Tissue Pressure Injury - Purple or maroon localized area of discolored intact skin or blood-filled blister due to damage of underlying soft tissue from pressure and/or shear. purple scabbed 09/13/22  1015  -- 173   Wound / Incision (Open or Dehisced) 01/31/14 Buttocks Left  01/31/14  1950  Buttocks  3320   Wound /  Incision (Open or Dehisced) 09/13/22 Irritant Dermatitis (Moisture Associated Skin Damage) Groin Bilateral pink,red, abdominal groin and vaginal folds 09/13/22  1020  Groin  173            Intake/Output Last 24 hours No intake or output data in the 24 hours ending 03/05/23 1747  Labs/Imaging Results for orders placed or performed during the hospital encounter of 03/05/23 (from the past 48 hour(s))  Basic metabolic panel     Status: Abnormal   Collection Time: 03/05/23  3:17 PM  Result Value Ref Range   Sodium 133 (L) 135 - 145 mmol/L   Potassium 3.2 (L) 3.5 - 5.1 mmol/L   Chloride 94 (L) 98 - 111 mmol/L   CO2 25 22 - 32 mmol/L   Glucose, Bld 277 (H) 70 - 99 mg/dL    Comment: Glucose reference range applies only to samples taken after fasting for at least 8 hours.   BUN 7 6 - 20 mg/dL   Creatinine, Ser 0.84 0.44 - 1.00 mg/dL   Calcium 9.0 8.9 - 10.3 mg/dL   GFR, Estimated >60 >60 mL/min    Comment: (NOTE) Calculated using the CKD-EPI Creatinine Equation (2021)    Anion gap 14 5 - 15     Comment: Performed at [facility] [address] [place], [place] [address]  Troponin I (High Sensitivity)     Status: None   Collection Time: 03/05/23  3:17 PM  Result Value Ref Range   Troponin I (High Sensitivity) 6 <18 ng/L    Comment: (NOTE) Elevated high sensitivity troponin I (hsTnI) values and significant  changes across serial measurements may suggest ACS but many other  chronic and acute conditions are known to elevate hsTnI results.  Refer to the "Links" section for chest pain algorithms and additional  guidance. Performed at [facility], [place] [address] [place], [place] [address]   Lactic acid, plasma     Status: None   Collection Time: 03/05/23  3:17 PM  Result Value Ref Range   Lactic Acid, Venous 1.4 0.5 - 1.9 mmol/L    Comment: Performed at [facility] [address], [place], [place] [address]  Lactic acid, plasma     Status: None   Collection Time: 03/05/23  4:26 PM  Result Value Ref Range   Lactic Acid, Venous 1.1 0.5 - 1.9 mmol/L    Comment: Performed at [facility] [address] [place], [place] [address]  CBC     Status: Abnormal   Collection Time: 03/05/23  4:26 PM  Result Value Ref Range   WBC 16.9 (H) 4.0 - 10.5 K/uL   RBC 4.02 3.87 - 5.11 MIL/uL   Hemoglobin 10.9 (L) 12.0 - 15.0 g/dL   HCT 33.0 (L) 36.0 - 46.0 %   MCV 82.1 80.0 - 100.0 fL   MCH 27.1 26.0 - 34.0 pg   MCHC 33.0 30.0 - 36.0 g/dL   RDW 12.7 11.5 - 15.5 %   Platelets 211 150 - 400 K/uL   nRBC 0.0 0.0 - 0.2 %    Comment: Performed at [facility], [place] [address], [place], [place] [address]   *Note: Due to a large number of results and/or encounters for the requested time period, some results have not been displayed. A complete set of results can be found in Results Review.   DG Chest Port 1 View  Result Date: 03/05/2023 CLINICAL DATA:  Cellulitis versus abscess of  foot, chest pain EXAM: PORTABLE CHEST 1 VIEW COMPARISON:  Portable exam 1422 hours  compared to 09/12/2022 FINDINGS: LEFT subclavian ICD with leads projecting over RIGHT atrium, RIGHT ventricle, and coronary sinus. Upper normal heart size. Mediastinal contours and pulmonary vascularity normal. Lungs clear. No infiltrate, pleural effusion, or pneumothorax. IMPRESSION: No acute abnormalities. Electronically Signed   By: Lavonia Dana M.D.   On: 03/05/2023 14:51   DG Foot Complete Right  Result Date: 03/05/2023 CLINICAL DATA:  Chest pain, type II diabetes mellitus, foot wound, abscess versus cellulitis of RIGHT foot EXAM: RIGHT FOOT COMPLETE - 3+ VIEW COMPARISON:  None available FINDINGS: Mild osseous demineralization. Minimal degenerative changes first MTP joint. Remaining joint spaces preserved. No acute fracture or dislocation. Large focus of soft tissue gas and swelling at the lateral margin of the fifth MTP joint with several associated angular radiopaque foreign bodies, 4 mm length adjacent to distal fifth metatarsal and 4 x 2 mm at the level of the fifth MTP joint, question glass fragments. Calcaneal spurring. No definite bone destruction identified to suggest osteomyelitis. IMPRESSION: No acute osseous abnormalities. Large area of soft tissue gas and soft tissue swelling at the lateral margin of the fifth MTP joint with several associated radiopaque foreign bodies question glass fragments. Findings are consistent with abscess/infection by gas-forming organism. No radiographic evidence of osteomyelitis though if this remains a clinical concern, consider MR assessment. Electronically Signed   By: Lavonia Dana M.D.   On: 03/05/2023 14:50    Pending Labs Unresulted Labs (From admission, onward)     Start     Ordered   03/06/23 0500  Hemoglobin A1c  Tomorrow morning,   R        03/05/23 1744   03/06/23 [id number]  Basic metabolic panel  Tomorrow morning,   R        03/05/23 1744   03/06/23 0500  CBC  Tomorrow morning,   R        03/05/23 1744   03/05/23 1417  Blood culture (routine x 2)   BLOOD CULTURE X 2,   R (with STAT occurrences)      03/05/23 1417            Vitals/Pain Today's Vitals   03/05/23 1433 03/05/23 1436 03/05/23 1437 03/05/23 1630  BP:  (!) 192/97  (!) 157/90  Pulse:  (!) 117  (!) 119  Resp:  18  19  Temp:  (!) 101.6 F (38.7 C)    TempSrc:  Oral    SpO2: 97% 96%  97%  Weight:   99.8 kg   Height:   5\' 9"  (1.753 m)   PainSc:  7       Isolation Precautions No active isolations  Medications Medications  vancomycin (VANCOREADY) IVPB 2000 mg/400 mL (has no administration in time range)  amLODipine (NORVASC) tablet 10 mg (has no administration in time range)  atorvastatin (LIPITOR) tablet 40 mg (has no administration in time range)  furosemide (LASIX) tablet 40 mg (has no administration in time range)  isosorbide mononitrate (IMDUR) 24 hr tablet 90 mg (has no administration in time range)  metoprolol succinate (TOPROL-XL) 24 hr tablet 100 mg (has no administration in time range)  spironolactone (ALDACTONE) tablet 100 mg (has no administration in time range)  busPIRone (BUSPAR) tablet 7.5 mg (has no administration in time range)  escitalopram (LEXAPRO) tablet 10 mg (has no administration in time range)  traZODone (DESYREL) tablet 50 mg (has no administration in time range)  levETIRAcetam (  KEPPRA) tablet 500 mg (has no administration in time range)  gabapentin (NEURONTIN) capsule 300 mg (has no administration in time range)  acetaminophen (TYLENOL) tablet 650 mg (has no administration in time range)    Or  acetaminophen (TYLENOL) suppository 650 mg (has no administration in time range)  oxyCODONE (Oxy IR/ROXICODONE) immediate release tablet 5 mg (has no administration in time range)  polyethylene glycol (MIRALAX / GLYCOLAX) packet 17 g (has no administration in time range)  insulin aspart (novoLOG) injection 0-15 Units (has no administration in time range)  insulin detemir (LEVEMIR) injection 25 Units (has no administration in time range)   lidocaine-EPINEPHrine (XYLOCAINE W/EPI) 2 %-1:200000 (PF) injection 10 mL (10 mLs Intradermal Given 03/05/23 1549)  piperacillin-tazobactam (ZOSYN) IVPB 3.375 g (0 g Intravenous Stopped 03/05/23 1631)    Mobility walks     Focused Assessments Cardiac Assessment Handoff:    Lab Results  Component Value Date   CKTOTAL 65 12/20/2012   CKMB 2.4 11/23/2011   TROPONINI <0.30 10/14/2014   Lab Results  Component Value Date   DDIMER 1.49 (H) 09/24/2013   Does the Patient currently have chest pain? No   , in triage said she was having chest pain, put told her son on the phone she came in bc she couldn't walk today and her foot was really hurting   R Recommendations: See Admitting Provider Note  Report given to:   Additional Notes: Pt is AOX4, lovely, left arm contracted, able do verbalize needs, normally able to walk but wasn't able to today, right foot was lanced under pinky toe, has been tachy, came in by EMS for chest pain but hasn't mentioned chest pain to me any

## 2023-03-05 NOTE — ED Triage Notes (Deleted)
Patient arrived to the ED via EMS from home for left sided chest pain. Patient states pain does not radiate. Patient states chest pain been going on for 1-2 days. Patient has pacemaker and defibrillator, denies defibrillator firing. Patient c/o right foot pain, foot is swollen and red. Patient has hx stroke leaving left sided deficits. Patient A&Ox4.

## 2023-03-06 ENCOUNTER — Encounter (HOSPITAL_COMMUNITY): Payer: PRIVATE HEALTH INSURANCE

## 2023-03-06 ENCOUNTER — Other Ambulatory Visit: Payer: Self-pay

## 2023-03-06 ENCOUNTER — Observation Stay (HOSPITAL_COMMUNITY): Payer: Medicaid Other | Admitting: Anesthesiology

## 2023-03-06 ENCOUNTER — Encounter (HOSPITAL_COMMUNITY)
Admission: EM | Disposition: A | Discharge status: Home Health | Payer: Self-pay | Source: Home / Self Care | Attending: Internal Medicine

## 2023-03-06 ENCOUNTER — Encounter (HOSPITAL_COMMUNITY): Payer: Self-pay | Admitting: Internal Medicine

## 2023-03-06 DIAGNOSIS — E1169 Type 2 diabetes mellitus with other specified complication: Secondary | ICD-10-CM | POA: Diagnosis present

## 2023-03-06 DIAGNOSIS — E1165 Type 2 diabetes mellitus with hyperglycemia: Secondary | ICD-10-CM | POA: Diagnosis present

## 2023-03-06 DIAGNOSIS — A419 Sepsis, unspecified organism: Secondary | ICD-10-CM | POA: Diagnosis present

## 2023-03-06 DIAGNOSIS — I2510 Atherosclerotic heart disease of native coronary artery without angina pectoris: Secondary | ICD-10-CM

## 2023-03-06 DIAGNOSIS — E1140 Type 2 diabetes mellitus with diabetic neuropathy, unspecified: Secondary | ICD-10-CM

## 2023-03-06 DIAGNOSIS — M86171 Other acute osteomyelitis, right ankle and foot: Secondary | ICD-10-CM | POA: Diagnosis not present

## 2023-03-06 DIAGNOSIS — Z7984 Long term (current) use of oral hypoglycemic drugs: Secondary | ICD-10-CM | POA: Diagnosis not present

## 2023-03-06 DIAGNOSIS — L02611 Cutaneous abscess of right foot: Secondary | ICD-10-CM | POA: Diagnosis present

## 2023-03-06 DIAGNOSIS — I480 Paroxysmal atrial fibrillation: Secondary | ICD-10-CM | POA: Diagnosis present

## 2023-03-06 DIAGNOSIS — I428 Other cardiomyopathies: Secondary | ICD-10-CM | POA: Diagnosis present

## 2023-03-06 DIAGNOSIS — I110 Hypertensive heart disease with heart failure: Secondary | ICD-10-CM | POA: Diagnosis present

## 2023-03-06 DIAGNOSIS — E785 Hyperlipidemia, unspecified: Secondary | ICD-10-CM | POA: Diagnosis present

## 2023-03-06 DIAGNOSIS — L089 Local infection of the skin and subcutaneous tissue, unspecified: Secondary | ICD-10-CM | POA: Diagnosis not present

## 2023-03-06 DIAGNOSIS — I5042 Chronic combined systolic (congestive) and diastolic (congestive) heart failure: Secondary | ICD-10-CM | POA: Diagnosis present

## 2023-03-06 DIAGNOSIS — M869 Osteomyelitis, unspecified: Secondary | ICD-10-CM | POA: Diagnosis present

## 2023-03-06 DIAGNOSIS — K219 Gastro-esophageal reflux disease without esophagitis: Secondary | ICD-10-CM | POA: Diagnosis present

## 2023-03-06 DIAGNOSIS — J45909 Unspecified asthma, uncomplicated: Secondary | ICD-10-CM | POA: Diagnosis present

## 2023-03-06 DIAGNOSIS — D72829 Elevated white blood cell count, unspecified: Secondary | ICD-10-CM | POA: Diagnosis not present

## 2023-03-06 DIAGNOSIS — M87874 Other osteonecrosis, right foot: Secondary | ICD-10-CM | POA: Diagnosis present

## 2023-03-06 DIAGNOSIS — F419 Anxiety disorder, unspecified: Secondary | ICD-10-CM | POA: Diagnosis present

## 2023-03-06 DIAGNOSIS — E1142 Type 2 diabetes mellitus with diabetic polyneuropathy: Secondary | ICD-10-CM | POA: Diagnosis present

## 2023-03-06 DIAGNOSIS — G40909 Epilepsy, unspecified, not intractable, without status epilepticus: Secondary | ICD-10-CM | POA: Diagnosis present

## 2023-03-06 DIAGNOSIS — Z794 Long term (current) use of insulin: Secondary | ICD-10-CM | POA: Diagnosis not present

## 2023-03-06 DIAGNOSIS — E11628 Type 2 diabetes mellitus with other skin complications: Secondary | ICD-10-CM

## 2023-03-06 DIAGNOSIS — L03115 Cellulitis of right lower limb: Secondary | ICD-10-CM | POA: Diagnosis present

## 2023-03-06 DIAGNOSIS — N179 Acute kidney failure, unspecified: Secondary | ICD-10-CM | POA: Diagnosis present

## 2023-03-06 DIAGNOSIS — I509 Heart failure, unspecified: Secondary | ICD-10-CM | POA: Diagnosis not present

## 2023-03-06 DIAGNOSIS — I69354 Hemiplegia and hemiparesis following cerebral infarction affecting left non-dominant side: Secondary | ICD-10-CM | POA: Diagnosis not present

## 2023-03-06 DIAGNOSIS — E119 Type 2 diabetes mellitus without complications: Secondary | ICD-10-CM | POA: Diagnosis not present

## 2023-03-06 DIAGNOSIS — F32A Depression, unspecified: Secondary | ICD-10-CM | POA: Diagnosis present

## 2023-03-06 DIAGNOSIS — E669 Obesity, unspecified: Secondary | ICD-10-CM | POA: Diagnosis present

## 2023-03-06 HISTORY — PX: I & D EXTREMITY: SHX5045

## 2023-03-06 LAB — GLUCOSE, CAPILLARY
Glucose-Capillary: 353 mg/dL — ABNORMAL HIGH (ref 70–99)
Glucose-Capillary: 246 mg/dL — ABNORMAL HIGH (ref 70–99)
Glucose-Capillary: 175 mg/dL — ABNORMAL HIGH (ref 70–99)
Glucose-Capillary: 134 mg/dL — ABNORMAL HIGH (ref 70–99)
Glucose-Capillary: 209 mg/dL — ABNORMAL HIGH (ref 70–99)

## 2023-03-06 LAB — BASIC METABOLIC PANEL
Anion gap: 8 (ref 5–15)
BUN: 8 mg/dL (ref 6–20)
CO2: 24 mmol/L (ref 22–32)
Calcium: 8.4 mg/dL — ABNORMAL LOW (ref 8.9–10.3)
Chloride: 98 mmol/L (ref 98–111)
Creatinine, Ser: 0.9 mg/dL (ref 0.44–1.00)
GFR, Estimated: >60 mL/min (ref >60)
Glucose, Bld: 360 mg/dL — ABNORMAL HIGH (ref 70–99)
Potassium: 3.5 mmol/L (ref 3.5–5.1)
Sodium: 130 mmol/L — ABNORMAL LOW (ref 135–145)

## 2023-03-06 LAB — CBC
HCT: 30.7 % — ABNORMAL LOW (ref 36.0–46.0)
Hemoglobin: 10 g/dL — ABNORMAL LOW (ref 12.0–15.0)
MCH: 26.9 pg (ref 26.0–34.0)
MCHC: 32.6 g/dL (ref 30.0–36.0)
MCV: 82.5 fL (ref 80.0–100.0)
Platelets: 220 10*3/uL (ref 150–400)
RBC: 3.72 MIL/uL — ABNORMAL LOW (ref 3.87–5.11)
RDW: 12.8 % (ref 11.5–15.5)
WBC: 14.8 10*3/uL — ABNORMAL HIGH (ref 4.0–10.5)
nRBC: 0 % (ref 0.0–0.2)

## 2023-03-06 LAB — MAGNESIUM: Magnesium: 1.6 mg/dL — ABNORMAL LOW (ref 1.7–2.4)

## 2023-03-06 SURGERY — IRRIGATION AND DEBRIDEMENT EXTREMITY
Anesthesia: General | Laterality: Right

## 2023-03-06 MED ORDER — PROPOFOL 10 MG/ML IV BOLUS
INTRAVENOUS | Status: DC | PRN
  Administered 2023-03-06: 100 mg via INTRAVENOUS

## 2023-03-06 MED ORDER — ONDANSETRON HCL 4 MG/2ML IJ SOLN
INTRAMUSCULAR | Status: DC | PRN
  Administered 2023-03-06: 4 mg via INTRAVENOUS

## 2023-03-06 MED ORDER — FENTANYL CITRATE (PF) 250 MCG/5ML IJ SOLN
INTRAMUSCULAR | Status: AC
  Filled 2023-03-06: qty 5

## 2023-03-06 MED ORDER — PHENYLEPHRINE 80 MCG/ML (10ML) SYRINGE FOR IV PUSH (FOR BLOOD PRESSURE SUPPORT)
PREFILLED_SYRINGE | INTRAVENOUS | Status: DC | PRN
  Administered 2023-03-06 (×3): 80 ug via INTRAVENOUS

## 2023-03-06 MED ORDER — SODIUM CHLORIDE 0.9 % IR SOLN
Status: DC | PRN
  Administered 2023-03-06: 3000 mL

## 2023-03-06 MED ORDER — PROMETHAZINE HCL 25 MG/ML IJ SOLN: 6.2500 mg | INTRAMUSCULAR | Status: DC | PRN

## 2023-03-06 MED ORDER — MAGNESIUM SULFATE 2 GM/50ML IV SOLN
2.0000 g | Freq: Once | INTRAVENOUS | Status: AC
  Administered 2023-03-06: 2 g via INTRAVENOUS
  Filled 2023-03-06: qty 50

## 2023-03-06 MED ORDER — VANCOMYCIN HCL 500 MG IV SOLR
INTRAVENOUS | Status: DC | PRN
  Administered 2023-03-06: 500 mg via TOPICAL

## 2023-03-06 MED ORDER — PHENYLEPHRINE HCL-NACL 20-0.9 MG/250ML-% IV SOLN
INTRAVENOUS | Status: DC | PRN
  Administered 2023-03-06: 30 ug/min via INTRAVENOUS

## 2023-03-06 MED ORDER — ACETAMINOPHEN 500 MG PO TABS
1000.0000 mg | ORAL_TABLET | Freq: Three times a day (TID) | ORAL | Status: DC
  Administered 2023-03-06 – 2023-03-11 (×14): 1000 mg via ORAL
  Filled 2023-03-06 (×15): qty 2

## 2023-03-06 MED ORDER — ORAL CARE MOUTH RINSE: 15.0000 mL | Freq: Once | OROMUCOSAL | Status: AC

## 2023-03-06 MED ORDER — LACTATED RINGERS IV SOLN: INTRAVENOUS | Status: DC

## 2023-03-06 MED ORDER — MEPERIDINE HCL 25 MG/ML IJ SOLN: 6.2500 mg | INTRAMUSCULAR | Status: DC | PRN

## 2023-03-06 MED ORDER — MIDAZOLAM HCL 2 MG/2ML IJ SOLN
INTRAMUSCULAR | Status: DC | PRN
  Administered 2023-03-06 (×2): 1 mg via INTRAVENOUS

## 2023-03-06 MED ORDER — FENTANYL CITRATE (PF) 100 MCG/2ML IJ SOLN: 25.0000 ug | INTRAMUSCULAR | Status: DC | PRN

## 2023-03-06 MED ORDER — FENTANYL CITRATE (PF) 250 MCG/5ML IJ SOLN
INTRAMUSCULAR | Status: DC | PRN
  Administered 2023-03-06: 25 ug via INTRAVENOUS

## 2023-03-06 MED ORDER — INSULIN ASPART 100 UNIT/ML IJ SOLN
0.0000 [IU] | INTRAMUSCULAR | Status: DC | PRN
  Administered 2023-03-06: 6 [IU] via SUBCUTANEOUS

## 2023-03-06 MED ORDER — LIDOCAINE 2% (20 MG/ML) 5 ML SYRINGE
INTRAMUSCULAR | Status: DC | PRN
  Administered 2023-03-06: 100 mg via INTRAVENOUS

## 2023-03-06 MED ORDER — CEFAZOLIN SODIUM-DEXTROSE 2-4 GM/100ML-% IV SOLN
2.0000 g | Freq: Once | INTRAVENOUS | Status: AC
  Administered 2023-03-06: 2 g via INTRAVENOUS

## 2023-03-06 MED ORDER — ENOXAPARIN SODIUM 40 MG/0.4ML IJ SOSY
40.0000 mg | PREFILLED_SYRINGE | INTRAMUSCULAR | Status: DC
  Administered 2023-03-06 – 2023-03-07 (×2): 40 mg via SUBCUTANEOUS
  Filled 2023-03-06 (×2): qty 0.4

## 2023-03-06 MED ORDER — MIDAZOLAM HCL 2 MG/2ML IJ SOLN
INTRAMUSCULAR | Status: AC
  Filled 2023-03-06: qty 2

## 2023-03-06 MED ORDER — AMISULPRIDE (ANTIEMETIC) 5 MG/2ML IV SOLN: 10.0000 mg | Freq: Once | INTRAVENOUS | Status: DC | PRN

## 2023-03-06 MED ORDER — VANCOMYCIN HCL 500 MG IV SOLR
INTRAVENOUS | Status: AC
  Filled 2023-03-06: qty 10

## 2023-03-06 MED ORDER — 0.9 % SODIUM CHLORIDE (POUR BTL) OPTIME
TOPICAL | Status: DC | PRN
  Administered 2023-03-06: 1000 mL

## 2023-03-06 MED ORDER — CHLORHEXIDINE GLUCONATE 0.12 % MT SOLN
OROMUCOSAL | Status: AC
  Administered 2023-03-06: 15 mL via OROMUCOSAL
  Filled 2023-03-06: qty 15

## 2023-03-06 MED ORDER — INSULIN DETEMIR 100 UNIT/ML ~~LOC~~ SOLN
25.0000 [IU] | Freq: Every day | SUBCUTANEOUS | Status: DC
  Administered 2023-03-06: 25 [IU] via SUBCUTANEOUS
  Filled 2023-03-06 (×2): qty 0.25

## 2023-03-06 MED ORDER — CEFAZOLIN SODIUM-DEXTROSE 2-4 GM/100ML-% IV SOLN
INTRAVENOUS | Status: AC
  Filled 2023-03-06: qty 100

## 2023-03-06 MED ORDER — CHLORHEXIDINE GLUCONATE 0.12 % MT SOLN: 15.0000 mL | Freq: Once | OROMUCOSAL | Status: AC

## 2023-03-06 MED ORDER — PROPOFOL 10 MG/ML IV BOLUS
INTRAVENOUS | Status: AC
  Filled 2023-03-06: qty 20

## 2023-03-06 SURGICAL SUPPLY — 56 items
BAG COUNTER SPONGE SURGICOUNT (BAG) ×1 IMPLANT
BANDAGE ESMARK 6X9 LF (GAUZE/BANDAGES/DRESSINGS) IMPLANT
BLADE SURG 10 STRL SS (BLADE) ×1 IMPLANT
BNDG COHESIVE 4X5 TAN STRL (GAUZE/BANDAGES/DRESSINGS) ×1 IMPLANT
BNDG ELASTIC 4X5.8 VLCR STR LF (GAUZE/BANDAGES/DRESSINGS) ×1 IMPLANT
BNDG ELASTIC 6X5.8 VLCR STR LF (GAUZE/BANDAGES/DRESSINGS) ×1 IMPLANT
BNDG ESMARK 4X9 LF (GAUZE/BANDAGES/DRESSINGS) IMPLANT
BNDG ESMARK 6X9 LF (GAUZE/BANDAGES/DRESSINGS)
BNDG GAUZE DERMACEA FLUFF 4 (GAUZE/BANDAGES/DRESSINGS) ×1 IMPLANT
CNTNR URN SCR LID CUP LEK RST (MISCELLANEOUS) IMPLANT
CONT SPEC 4OZ STRL OR WHT (MISCELLANEOUS)
COVER SURGICAL LIGHT HANDLE (MISCELLANEOUS) ×1 IMPLANT
CUFF TOURN SGL LL 12 NO SLV (MISCELLANEOUS) IMPLANT
DRAPE SURG 17X23 STRL (DRAPES) IMPLANT
DRAPE U-SHAPE 47X51 STRL (DRAPES) IMPLANT
DRSG ADAPTIC 3X8 NADH LF (GAUZE/BANDAGES/DRESSINGS) IMPLANT
DURAPREP 26ML APPLICATOR (WOUND CARE) ×1 IMPLANT
ELECT REM PT RETURN 9FT ADLT (ELECTROSURGICAL)
ELECTRODE REM PT RTRN 9FT ADLT (ELECTROSURGICAL) IMPLANT
EVACUATOR 1/8 PVC DRAIN (DRAIN) IMPLANT
FACESHIELD WRAPAROUND (MASK) ×1 IMPLANT
FACESHIELD WRAPAROUND OR TEAM (MASK) ×1 IMPLANT
GAUZE PAD ABD 8X10 STRL (GAUZE/BANDAGES/DRESSINGS) ×1 IMPLANT
GAUZE SPONGE 4X4 12PLY STRL (GAUZE/BANDAGES/DRESSINGS) ×1 IMPLANT
GAUZE XEROFORM 1X8 LF (GAUZE/BANDAGES/DRESSINGS) ×1 IMPLANT
GLOVE BIO SURGEON STRL SZ7.5 (GLOVE) ×1 IMPLANT
GLOVE BIOGEL PI IND STRL 7.5 (GLOVE) ×1 IMPLANT
GLOVE BIOGEL PI IND STRL 8 (GLOVE) ×1 IMPLANT
GLOVE SURG SYN 7.5  E (GLOVE) ×1
GLOVE SURG SYN 7.5 E (GLOVE) ×1 IMPLANT
GLOVE SURG SYN 7.5 PF PI (GLOVE) ×1 IMPLANT
GOWN STRL REUS W/ TWL LRG LVL3 (GOWN DISPOSABLE) ×2 IMPLANT
GOWN STRL REUS W/ TWL XL LVL3 (GOWN DISPOSABLE) ×2 IMPLANT
GOWN STRL REUS W/TWL LRG LVL3 (GOWN DISPOSABLE) ×2
GOWN STRL REUS W/TWL XL LVL3 (GOWN DISPOSABLE) ×2
KIT BASIN OR (CUSTOM PROCEDURE TRAY) ×1 IMPLANT
KIT TURNOVER KIT B (KITS) ×1 IMPLANT
MANIFOLD NEPTUNE II (INSTRUMENTS) ×1 IMPLANT
NDL HYPO 25GX1X1/2 BEV (NEEDLE) IMPLANT
NEEDLE HYPO 25GX1X1/2 BEV (NEEDLE) IMPLANT
NS IRRIG 1000ML POUR BTL (IV SOLUTION) ×1 IMPLANT
PACK ORTHO EXTREMITY (CUSTOM PROCEDURE TRAY) ×1 IMPLANT
PAD ABD 8X10 STRL (GAUZE/BANDAGES/DRESSINGS) IMPLANT
PAD ARMBOARD 7.5X6 YLW CONV (MISCELLANEOUS) ×2 IMPLANT
SET CYSTO W/LG BORE CLAMP LF (SET/KITS/TRAYS/PACK) IMPLANT
SPONGE T-LAP 18X18 ~~LOC~~+RFID (SPONGE) IMPLANT
STOCKINETTE IMPERVIOUS 9X36 MD (GAUZE/BANDAGES/DRESSINGS) ×1 IMPLANT
SUT ETHILON 3 0 PS 1 (SUTURE) IMPLANT
SUT PDS AB 2-0 CT1 27 (SUTURE) IMPLANT
SWAB CULTURE ESWAB REG 1ML (MISCELLANEOUS) IMPLANT
SYR CONTROL 10ML LL (SYRINGE) IMPLANT
TOWEL GREEN STERILE (TOWEL DISPOSABLE) ×1 IMPLANT
TOWEL GREEN STERILE FF (TOWEL DISPOSABLE) ×1 IMPLANT
TUBE CONNECTING 12X1/4 (SUCTIONS) ×1 IMPLANT
UNDERPAD 30X36 HEAVY ABSORB (UNDERPADS AND DIAPERS) ×1 IMPLANT
YANKAUER SUCT BULB TIP NO VENT (SUCTIONS) ×1 IMPLANT

## 2023-03-06 NOTE — Progress Notes (Signed)
Pt sent to OR, verbal order received from medical team that pt may transport without tele.

## 2023-03-06 NOTE — Op Note (Signed)
03/05/2023 - 03/06/2023  9:45 AM  PATIENT:  [name]    PRE-OPERATIVE DIAGNOSIS:  Right Foot Infection  POST-OPERATIVE DIAGNOSIS:  Same  PROCEDURE:  IRRIGATION AND DEBRIDEMENT OF RIGHT FOOT  SURGEON:  Renette Butters, MD  ASSISTANT: Aggie Moats, PA-C, he was present and scrubbed throughout the case, critical for completion in a timely fashion, and for retraction, instrumentation, and closure.   ANESTHESIA:   gen  PREOPERATIVE INDICATIONS:  [name] is a  57 y.o. female with a diagnosis of Right Foot Infection who failed conservative measures and elected for surgical management.    The risks benefits and alternatives were discussed with the patient preoperatively including but not limited to the risks of infection, bleeding, nerve injury, cardiopulmonary complications, the need for revision surgery, among others, and the patient was willing to proceed.  OPERATIVE IMPLANTS: none  OPERATIVE FINDINGS: purulent fluid  BLOOD LOSS: 20  COMPLICATIONS: nonoe  TOURNIQUET TIME: none  OPERATIVE PROCEDURE:  Patient was identified in the preoperative holding area and site was marked by me She was transported to the operating theater and placed on the table in supine position taking care to pad all bony prominences. After a preincinduction time out anesthesia was induced. The right lower extremity was prepped and draped in normal sterile fashion and a pre-incision timeout was performed. She received ancef for preoperative antibiotics.   I made a lateral incision extending the oblique ER incision proximal and distal.  I immediately noted some purulent fluid releasing this abscess  Next I debrided the soft tissue here using a rondure protecting neurovascular structures.  Infection tracked down to the lateral aspect of the fifth metatarsal head I used a rondure to debride necrotic bone here  I then probed on the plantar surface with there was some apparent infection on CAT  scan I was able to evacuate purulent fluid here as well.  After a complete debridement of all devitalized or necrotic tissue down to level of bone at the fifth metatarsal head I thoroughly irrigated with 3 L of saline I then placed vancomycin powder loose closure with a drain and a sterile dressing  POST OPERATIVE PLAN: Continue antibiotics per primary team DVT prophylaxis per primary team possibility of recurrent surgery needed will follow and assess

## 2023-03-06 NOTE — Progress Notes (Addendum)
HD#0 Subjective:   Summary: This is a 57 year old female with a past medical history of type 2 diabetes, A-fib on Xarelto, HFrEF who presented for chest pain.  Patient found to have right lower extremity erythema and edema.  Patient mated for further evaluation and management of sepsis secondary to diabetic foot infection.  Overnight Events: No overnight events  Patient states she is improved.  She states her pain is better.  She denies any chest pain.  She states the lidocaine patch worked well.  She denies any shortness of breath, fever, or chills.  Patient states she is ready for surgery.  Objective:  Vital signs in last 24 hours: Vitals:   03/06/23 0432 03/06/23 0823 03/06/23 0955 03/06/23 1010  BP: 119/72  (!) 142/86 (!) 140/82  Pulse: 89  96 89  Resp: 18  15 13   Temp: 98.5 F (36.9 C)  98.1 F (36.7 C)   TempSrc: Oral     SpO2: 96%  100% 100%  Weight: 109.1 kg 109.1 kg    Height:  5' 9.02" (1.753 m)     Supplemental O2: Room Air SpO2: 100 %   Physical Exam:  Constitutional: Resting in bed, no acute distress HENT: normocephalic atraumatic Eyes: conjunctiva non-erythematous Cardiovascular: regular rate and rhythm, no m/r/g Pulmonary/Chest: normal work of breathing on room air, lungs clear to auscultation bilaterally Abdominal: soft, non-tender, non-distended Extremities: Right lower extremity with noticeable erythema and edema noted.  When compared to left lower extremity, right lower extremity with increased warmth.  There is drainage noted out of right lateral aspect near fifth MTP joint which is purulent.  Filed Weights   03/05/23 1437 03/06/23 0432 03/06/23 0823  Weight: 99.8 kg 109.1 kg 109.1 kg     Intake/Output Summary (Last 24 hours) at 03/06/2023 1026 Last data filed at 03/06/2023 0803 Gross per 24 hour  Intake 0 ml  Output 250 ml  Net -250 ml   Net IO Since Admission: -250 mL [03/06/23 1026]  Pertinent Labs:    Latest Ref Rng & Units 03/06/2023     1:35 AM 03/05/2023    4:26 PM 09/15/2022    6:07 AM  CBC  WBC 4.0 - 10.5 K/uL 14.8  16.9  17.0   Hemoglobin 12.0 - 15.0 g/dL 10.0  10.9  10.8   Hematocrit 36.0 - 46.0 % 30.7  33.0  33.4   Platelets 150 - 400 K/uL 220  211  259        Latest Ref Rng & Units 03/06/2023    1:35 AM 03/05/2023    3:17 PM 09/15/2022    6:07 AM  CMP  Glucose 70 - 99 mg/dL 360  277  82   BUN 6 - 20 mg/dL 8  7  8    Creatinine 0.44 - 1.00 mg/dL 0.90  0.84  1.07   Sodium 135 - 145 mmol/L 130  133  137   Potassium 3.5 - 5.1 mmol/L 3.5  3.2  3.5   Chloride 98 - 111 mmol/L 98  94  99   CO2 22 - 32 mmol/L 24  25  26    Calcium 8.9 - 10.3 mg/dL 8.4  9.0  8.7     Imaging: CT FOOT RIGHT W CONTRAST  Result Date: 03/05/2023 CLINICAL DATA:  Diabetic osteomyelitis suspected.  Foot swelling. EXAM: CT OF THE LOWER RIGHT EXTREMITY WITH CONTRAST TECHNIQUE: Multidetector CT imaging of the lower right extremity was performed according to the standard protocol following intravenous contrast administration.  RADIATION DOSE REDUCTION: This exam was performed according to the departmental dose-optimization program which includes automated exposure control, adjustment of the mA and/or kV according to patient size and/or use of iterative reconstruction technique. CONTRAST:  150mL OMNIPAQUE IOHEXOL 350 MG/ML SOLN COMPARISON:  Right foot radiograph dated 03/05/2023. FINDINGS: Bones/Joint/Cartilage There is no acute fracture or dislocation. The bones are osteopenic. No significant arthritic changes. The ankle mortise is intact. There may be small focal bone erosion of the lateral fifth metatarsal head (axial 57/5). However, no definite bone erosion seen on the other views. There is soft tissue gas adjacent to the fifth MTP joint suspicious for an infectious process. Eighth there is clinical concern for acute osteomyelitis, further evaluation with MRI or a white blood cell nuclear scan is recommended. Ligaments Suboptimally assessed by CT.  Muscles and Tendons No acute findings.  No intramuscular fluid collection or hematoma. Soft tissues Soft tissue gas at the fifth MTP joint extending from the plantar to dorsal skin suspicious for gas-forming infection. There is diffuse skin thickening and subcutaneous edema of the dorsum of the foot which may represent cellulitis. No drainable fluid collection or abscess. There is skin wound of the dorsal and plantar aspect of the lateral foot at the level of the fifth MTP joint. There is a 3 mm radiopaque fragment within the plantar skin wound. Correlation with clinical exam recommended. IMPRESSION: 1. No acute fracture or dislocation. 2. Soft tissue gas with possible small focus of bone erosion involving the fifth metatarsal head concerning for gas-forming infection or osteomyelitis. Further evaluation with MRI or a white blood cell nuclear scan recommended. No drainable fluid collection or abscess. 3. Skin wound of the dorsal and plantar aspect of the lateral foot at the level of the fifth MTP joint. There is a 3 mm radiopaque fragment within the plantar skin wound. Correlation with clinical exam recommended. Electronically Signed   By: Anner Crete M.D.   On: 03/05/2023 19:29   DG Chest Port 1 View  Result Date: 03/05/2023 CLINICAL DATA:  Cellulitis versus abscess of foot, chest pain EXAM: PORTABLE CHEST 1 VIEW COMPARISON:  Portable exam 1422 hours compared to 09/12/2022 FINDINGS: LEFT subclavian ICD with leads projecting over RIGHT atrium, RIGHT ventricle, and coronary sinus. Upper normal heart size. Mediastinal contours and pulmonary vascularity normal. Lungs clear. No infiltrate, pleural effusion, or pneumothorax. IMPRESSION: No acute abnormalities. Electronically Signed   By: Lavonia Dana M.D.   On: 03/05/2023 14:51   DG Foot Complete Right  Result Date: 03/05/2023 CLINICAL DATA:  Chest pain, type II diabetes mellitus, foot wound, abscess versus cellulitis of RIGHT foot EXAM: RIGHT FOOT COMPLETE -  3+ VIEW COMPARISON:  None available FINDINGS: Mild osseous demineralization. Minimal degenerative changes first MTP joint. Remaining joint spaces preserved. No acute fracture or dislocation. Large focus of soft tissue gas and swelling at the lateral margin of the fifth MTP joint with several associated angular radiopaque foreign bodies, 4 mm length adjacent to distal fifth metatarsal and 4 x 2 mm at the level of the fifth MTP joint, question glass fragments. Calcaneal spurring. No definite bone destruction identified to suggest osteomyelitis. IMPRESSION: No acute osseous abnormalities. Large area of soft tissue gas and soft tissue swelling at the lateral margin of the fifth MTP joint with several associated radiopaque foreign bodies question glass fragments. Findings are consistent with abscess/infection by gas-forming organism. No radiographic evidence of osteomyelitis though if this remains a clinical concern, consider MR assessment. Electronically Signed   By: Elta Gu[place]  Thornton Papas M.D.   On: 03/05/2023 14:50    Assessment/Plan:   Principal Problem:   Sepsis ([place]) Active Problems:   Benign essential HTN   Chronic combined systolic and diastolic heart failure (HCC)   ICD (implantable cardioverter-defibrillator) in place   Hemiparesis affecting left side as late effect of cerebrovascular accident [facility])   Poorly controlled type 2 diabetes mellitus with neuropathy ([place])   Atrial fibrillation ([place])   Diabetic foot infection ([place])   Patient Summary: [name] is a 57 y.o. person living with a history of type 2 diabetes, HFrEF, atrial fibrillation who presents for chest pain.  Patient found to have right foot erythema and swelling concerning for cellulitis.  Patient was SIRS positive with likely source of infection from foot wound.  Patient admitted for further evaluation and management of sepsis likely secondary to diabetic foot wound of right foot.   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 She notes that her pain is better. She reported that she was able to sleep well this morning. She denies any further fevers or chills. On exam patient still has a good amount of erythema noted to her right lower extremity. She also has a good amount of edema noted as well to the right lower extremity. CT imaging concerning for gas forming organisms. Patient would benefit from MRI but patient has Pacer and defibrillator that is not MRI compatible. Given the concern for gas forming organisms, patient was transitioned from vancomycin to linezolid last night. Labs showing leukocytosis is resolving. Patient is afebrile overnight.  Patient currently remains hemodynamically stable.  Ortho planning to take patient to the OR today for further wash out. -Zosyn day 2  -Linezolid day 2 -Multimodal pain control with tylenol and oxycodone -Follow culutres -Ortho following, planning washout today -Follow up ABIs -Continue to monitor fever curve -Monitor white count   #Atypical chest pain, resolved Patient with no further chest pain this moring. She rpeorts that the lidocaine patch worked well for her. She notes that she slept well with no concerns.  On exam, no chest tenderness appreciated. -Continue lidocaine patch  #Pseudohyponatremia  #Hypokalemia, resolved #Hypomagnesemia Sodium down to 130 today, corrects to 134-136.  Potassium corrected well. Decreased this morning.  Will replete. -Replete magnesium -Monitor BMP   #CAD #HFrEF Patient has past medical history of HFrEF.  No acute concerns per exacerbation. Patient does have some pitting edema noted to right lower extremity, but unlikely related to heart failure, and more related to infection.  Patient remains hemodynamically stable.  Respiratory status at baseline.  -Continue home metoprolol succinate 1 mg daily -Continue home Imdur 90 mg  daily -Continue home spironolactone 1 mg daily -Hold home hydralazine 25 mg 3 times daily -Continue home Lipitor 40 mg daily   #Atrial fibrillation No acute concerns for RVR.  Patient's heart rates have slowly come down overnight.  Holding Xarelto. -Continue metoprolol 100 mg daily -Continue on telemetry -Continue to hold Xarelto 20 mg daily  #Hypertension Blood pressures are slowly come down with most recent blood pressure at 119/72.  Continue home medications. -Continue home amlodipine, Imdur, metoprolol succinate, spironolactone -Continue to hold hydralazine 25 mg 3 times daily -Continue to monitor blood pressure   #Type II Diabetes Mellitus  Resumed lower dose than home dose of long-acting insulin at 17 units.  Blood sugars remain elevated.  Will increase to home dose of 25 units.  A1c pending. -  Increase to Home dose of 25 units of long-acting insulin -Sliding scale insulin -Continue to monitor CBGs   #Anxiety and depression -Continue home Lexapro 10 mg daily -Continue home BuSpar 7.5 mg twice daily   #Seizure disorder No acute concerns -Continue Keppra 500 mg twice daily   Diet: NPO IVF: None,None VTE: SCDs Code: Full PT/OT recs: Pending   Dispo: Anticipated discharge to Home in 3 days pending clinical improvement.   [place] Resident PGY-1 [phone] Please contact the on call pager after 5 pm and on weekends at [phone].

## 2023-03-06 NOTE — Transfer of Care (Signed)
Immediate Anesthesia Transfer of Care Note  Patient: [name]  Procedure(s) Performed: IRRIGATION AND DEBRIDEMENT OF RIGHT FOOT (Right)  Patient Location: PACU  Anesthesia Type:General  Level of Consciousness: awake, alert , and oriented  Airway & Oxygen Therapy: Patient Spontanous Breathing  Post-op Assessment: Report given to RN and Post -op Vital signs reviewed and stable  Post vital signs: Reviewed and stable  Last Vitals:  Vitals Value Taken Time  BP 142/86 03/06/23 0955  Temp    Pulse 94 03/06/23 0959  Resp 13 03/06/23 0959  SpO2 95 % 03/06/23 0959  Vitals shown include unvalidated device data.  Last Pain:  Vitals:   03/06/23 0730  TempSrc:   PainSc: 0-No pain         Complications: No notable events documented.

## 2023-03-06 NOTE — Anesthesia Procedure Notes (Signed)
Procedure Name: LMA Insertion Date/Time: 03/06/2023 8:54 AM  Performed by: Griffin Dakin, CRNAPre-anesthesia Checklist: Patient identified, Emergency Drugs available, Suction available and Patient being monitored Patient Re-evaluated:Patient Re-evaluated prior to induction Oxygen Delivery Method: Circle system utilized Preoxygenation: Pre-oxygenation with 100% oxygen Induction Type: IV induction LMA: LMA inserted LMA Size: 4.0 Number of attempts: 1 Placement Confirmation: positive ETCO2 and breath sounds checked- equal and bilateral Tube secured with: Tape Dental Injury: Teeth and Oropharynx as per pre-operative assessment

## 2023-03-06 NOTE — Anesthesia Postprocedure Evaluation (Signed)
Anesthesia Post Note  Patient: [name]  Procedure(s) Performed: IRRIGATION AND DEBRIDEMENT OF RIGHT FOOT (Right)     Patient location during evaluation: PACU Anesthesia Type: General Level of consciousness: sedated and patient cooperative Pain management: pain level controlled Vital Signs Assessment: post-procedure vital signs reviewed and stable Respiratory status: spontaneous breathing Cardiovascular status: stable Anesthetic complications: no   No notable events documented.  Last Vitals:  Vitals:   03/06/23 1025 03/06/23 1047  BP: 128/78 125/84  Pulse: 89 87  Resp: 13   Temp: 37.1 C 36.7 C  SpO2: 100% 92%    Last Pain:  Vitals:   03/06/23 1313  TempSrc:   PainSc: [place]

## 2023-03-07 ENCOUNTER — Other Ambulatory Visit: Payer: Self-pay | Admitting: Internal Medicine

## 2023-03-07 ENCOUNTER — Encounter (HOSPITAL_COMMUNITY): Payer: Self-pay | Admitting: Orthopedic Surgery

## 2023-03-07 DIAGNOSIS — D72829 Elevated white blood cell count, unspecified: Secondary | ICD-10-CM | POA: Diagnosis not present

## 2023-03-07 DIAGNOSIS — L03115 Cellulitis of right lower limb: Secondary | ICD-10-CM

## 2023-03-07 DIAGNOSIS — E1165 Type 2 diabetes mellitus with hyperglycemia: Secondary | ICD-10-CM | POA: Diagnosis not present

## 2023-03-07 DIAGNOSIS — M86171 Other acute osteomyelitis, right ankle and foot: Secondary | ICD-10-CM | POA: Diagnosis not present

## 2023-03-07 DIAGNOSIS — A419 Sepsis, unspecified organism: Secondary | ICD-10-CM | POA: Diagnosis not present

## 2023-03-07 LAB — CBC
HCT: 27.4 % — ABNORMAL LOW (ref 36.0–46.0)
Hemoglobin: 8.9 g/dL — ABNORMAL LOW (ref 12.0–15.0)
MCH: 27.2 pg (ref 26.0–34.0)
MCHC: 32.5 g/dL (ref 30.0–36.0)
MCV: 83.8 fL (ref 80.0–100.0)
Platelets: 194 10*3/uL (ref 150–400)
RBC: 3.27 MIL/uL — ABNORMAL LOW (ref 3.87–5.11)
RDW: 12.6 % (ref 11.5–15.5)
WBC: 14.3 10*3/uL — ABNORMAL HIGH (ref 4.0–10.5)
nRBC: 0 % (ref 0.0–0.2)

## 2023-03-07 LAB — GLUCOSE, CAPILLARY
Glucose-Capillary: 203 mg/dL — ABNORMAL HIGH (ref 70–99)
Glucose-Capillary: 191 mg/dL — ABNORMAL HIGH (ref 70–99)
Glucose-Capillary: 317 mg/dL — ABNORMAL HIGH (ref 70–99)
Glucose-Capillary: 307 mg/dL — ABNORMAL HIGH (ref 70–99)

## 2023-03-07 LAB — HEMOGLOBIN A1C
Hgb A1c MFr Bld: 10.4 % — ABNORMAL HIGH (ref 4.8–5.6)
Mean Plasma Glucose: 252 mg/dL

## 2023-03-07 LAB — BASIC METABOLIC PANEL
Anion gap: 10 (ref 5–15)
BUN: 9 mg/dL (ref 6–20)
CO2: 24 mmol/L (ref 22–32)
Calcium: 8.3 mg/dL — ABNORMAL LOW (ref 8.9–10.3)
Chloride: 98 mmol/L (ref 98–111)
Creatinine, Ser: 1.04 mg/dL — ABNORMAL HIGH (ref 0.44–1.00)
GFR, Estimated: >60 mL/min (ref >60)
Glucose, Bld: 218 mg/dL — ABNORMAL HIGH (ref 70–99)
Potassium: 3.6 mmol/L (ref 3.5–5.1)
Sodium: 132 mmol/L — ABNORMAL LOW (ref 135–145)

## 2023-03-07 MED ORDER — SENNOSIDES-DOCUSATE SODIUM 8.6-50 MG PO TABS
1.0000 | ORAL_TABLET | Freq: Every day | ORAL | Status: DC
  Administered 2023-03-07 – 2023-03-10 (×4): 1 via ORAL
  Filled 2023-03-07 (×4): qty 1

## 2023-03-07 MED ORDER — INSULIN DETEMIR 100 UNIT/ML ~~LOC~~ SOLN
30.0000 [IU] | Freq: Every day | SUBCUTANEOUS | Status: DC
  Administered 2023-03-07 – 2023-03-10 (×4): 30 [IU] via SUBCUTANEOUS
  Filled 2023-03-07 (×6): qty 0.3

## 2023-03-07 NOTE — Progress Notes (Signed)
Subjective: Patient reports pain as mild to moderate in the foot. Has a headache and overall feels run down. Tolerating diet.  Urinating.   No CP, SOB.  Hasn't mobilized OOB yet.   Objective:   VITALS:   Vitals:   03/06/23 1726 03/06/23 2114 03/07/23 0546 03/07/23 0810  BP:  122/65 129/75 115/64  Pulse:  91 79 68  Resp:  18 18 18   Temp:  99.8 F (37.7 C) 98.6 F (37 C) 97.7 F (36.5 C)  TempSrc:  Oral Oral Oral  SpO2: 95% 92% 98% 96%  Weight:      Height:          Latest Ref Rng & Units 03/07/2023    6:27 AM 03/06/2023    1:35 AM 03/05/2023    4:26 PM  CBC  WBC 4.0 - 10.5 K/uL 14.3  14.8  16.9   Hemoglobin 12.0 - 15.0 g/dL 8.9  10.0  10.9   Hematocrit 36.0 - 46.0 % 27.4  30.7  33.0   Platelets 150 - 400 K/uL 194  220  211       Latest Ref Rng & Units 03/07/2023    6:27 AM 03/06/2023    1:35 AM 03/05/2023    3:17 PM  BMP  Glucose 70 - 99 mg/dL 218  360  277   BUN 6 - 20 mg/dL 9  8  7    Creatinine 0.44 - 1.00 mg/dL 1.04  0.90  0.84   Sodium 135 - 145 mmol/L 132  130  133   Potassium 3.5 - 5.1 mmol/L 3.6  3.5  3.2   Chloride 98 - 111 mmol/L 98  98  94   CO2 22 - 32 mmol/L 24  24  25    Calcium 8.9 - 10.3 mg/dL 8.3  8.4  9.0    Intake/Output      03/24 0701 03/25 0700 03/25 0701 03/26 0700   P.O. 480    IV Piggyback 722.5    Total Intake(mL/kg) 1202.5 (11)    Urine (mL/kg/hr) 550 (0.2)    Emesis/NG output 0    Stool 0    Total Output 550    Net +652.5         Urine Occurrence 1 x    Stool Occurrence 0 x    Emesis Occurrence 0 x       Physical Exam: General: NAD.  Laying in bed, calm Resp: No increased wob Cardio: regular rate and rhythm ABD soft Neurologically intact MSK Neurovascularly intact Sensation intact distally Intact pulses distally Dorsiflexion/Plantar flexion intact Can wiggle toes Calf soft and compressible Incision: dressing C/D/I   Assessment: 1 Day Post-Op  S/P Procedure(s) (LRB): IRRIGATION AND DEBRIDEMENT OF RIGHT FOOT  (Right) by Dr. Ernesta Amble. Percell Miller on 03/06/23  Principal Problem:   Sepsis ([place]) Active Problems:   Benign essential HTN   Chronic combined systolic and diastolic heart failure ([place])   ICD (implantable cardioverter-defibrillator) in place   Hemiparesis affecting left side as late effect of cerebrovascular accident [facility])   Poorly controlled type 2 diabetes mellitus with neuropathy ([place])   Atrial fibrillation ([place])   Diabetic foot infection ([place])   Plan: Intra-op specimen showing few gram + cocci in pairs Continue IV ABX Advance diet Up with therapy Incentive Spirometry Elevate and Apply ice  Weightbearing: WBAT RLE Insicional and dressing care: Reinforce dressings as needed Orthopedic device(s): None Showering: Keep dressing dry VTE prophylaxis: Lovenox 40mg  qd , SCDs, ambulation Pain control: continue  current regimen Follow - up plan: 1 week in the office  Contact information:  Edmonia Lynch MD, Aggie Moats PA-C  Dispo:  TBD. Likely can go home today or tomorrow if medically ready. Want PT to work with her first. May need ID consult.      Britt Bottom, PA-C Office [phone] 03/07/2023, 10:40 AM

## 2023-03-07 NOTE — Evaluation (Addendum)
Occupational Therapy Evaluation Patient Details Name: [name] MRN: [id number] DOB: Jan 16, 1966 Today's Date: 03/07/2023   History of Present Illness Patient is a 57 y/o female who presents on 3/23 with SOB, CP and right foot pain/swelling. s/p I&D in ED and s/p I&D right foot in OR on 3/24. Found to have sepsis secondary to diabetic wound of right foot. PMH includes CVA with left hemiparesis, ICD, CHF, A-fib, DM2, CAD, obesity, OSA on CPAP, seizures.   Clinical Impression   Pt reports having assist at baseline with ADLs and uses a quad cane for functional mobility, lives with roommate who can provide PRN assist, but also has PCA that comes 6hrs/day to assist with ADLs/IADLs. Pt needing set up -max A for ADLs, and min-mod +2 for transfers with hemi-walker. Pt needing R foot blocked when transitioning to standing to avoid sliding forward. Pt with no active LUE movement from prior CVA, reports she has a splint she typically wears at night, dry rolled/folded washcloth placed in pt's L hand to preserve skin/joint integrity while splint is not here. Pt presenting with impairments listed below, will follow acutely. Pt to benefit from therapy services in the home at d/c to maximize safety and independence with ADLs/functional mobility.      Recommendations for follow up therapy are one component of a multi-disciplinary discharge planning process, led by the attending physician.  Recommendations may be updated based on patient status, additional functional criteria and insurance authorization.   Assistance Recommended at Discharge Frequent or constant Supervision/Assistance  Patient can return home with the following A lot of help with walking and/or transfers;A lot of help with bathing/dressing/bathroom;Assistance with cooking/housework;Direct supervision/assist for medications management;Direct supervision/assist for financial management;Assist for transportation;Help with stairs or ramp for  entrance    Functional Status Assessment  Patient has had a recent decline in their functional status and demonstrates the ability to make significant improvements in function in a reasonable and predictable amount of time.  Equipment Recommendations  None recommended by OT (pt has all needed DME)    Recommendations for Other Services PT consult     Precautions / Restrictions Precautions Precautions: Fall Precaution Comments: left hemiparesis Required Braces or Orthoses: Other Brace Other Brace: post op shoe on right (to be delivered 3/25) Restrictions Weight Bearing Restrictions: Yes RLE Weight Bearing: Weight bearing as tolerated      Mobility Bed Mobility               General bed mobility comments: pt EOB with PT upon arrival    Transfers Overall transfer level: Needs assistance Equipment used: Hemi-walker Transfers: Sit to/from Stand, Bed to chair/wheelchair/BSC Sit to Stand: Mod assist, +2 physical assistance     Step pivot transfers: Min assist, +2 physical assistance, +2 safety/equipment     General transfer comment: needing R foot blocked initally, while pt transitioning into standing      Balance Overall balance assessment: Needs assistance Sitting-balance support: Feet supported, No upper extremity supported Sitting balance-Leahy Scale: Fair Sitting balance - Comments: supervision for safety.   Standing balance support: During functional activity, Single extremity supported Standing balance-Leahy Scale: Poor Standing balance comment: Requires unilateral UE support and external support                           ADL either performed or assessed with clinical judgement   ADL Overall ADL's : Needs assistance/impaired Eating/Feeding: Set up;Sitting   Grooming: Set up;Sitting   Upper Body  Bathing: Moderate assistance;Sitting   Lower Body Bathing: Maximal assistance;Sitting/lateral leans   Upper Body Dressing : Moderate  assistance;Sitting   Lower Body Dressing: Maximal assistance;Sitting/lateral leans   Toilet Transfer: Moderate assistance;+2 for physical assistance;Minimal assistance   Toileting- Clothing Manipulation and Hygiene: Minimal assistance;Sitting/lateral lean;Sit to/from stand       Functional mobility during ADLs: Minimal assistance;Moderate assistance;+2 for physical assistance       Vision   Additional Comments: will further assess     Perception Perception Perception Tested?: No   Praxis Praxis Praxis tested?: Not tested    Pertinent Vitals/Pain Pain Assessment Pain Assessment: Faces Pain Score: 7  Faces Pain Scale: Hurts even more Pain Location: right foot Pain Descriptors / Indicators: Sore, Discomfort, Grimacing, Guarding Pain Intervention(s): Limited activity within patient's tolerance, Monitored during session, Repositioned     Hand Dominance Right   Extremity/Trunk Assessment Upper Extremity Assessment Upper Extremity Assessment: LUE deficits/detail LUE Deficits / Details: no active movement noted from prior CVA, pt reports she has a splint she typically wears at night time on LUE but did not bring to hospital LUE Sensation: decreased light touch LUE Coordination: decreased fine motor;decreased gross motor   Lower Extremity Assessment Lower Extremity Assessment: Defer to PT evaluation (has brace for LLE, reports it is at home) RLE Deficits / Details: Able to wiggle toes and perform SLR RLE Sensation: decreased light touch LLE Deficits / Details: hemiparesis present throughout LLE with limited AROM at ankle, knee and hip. LLE Sensation: decreased light touch LLE Coordination: decreased gross motor;decreased fine motor   Cervical / Trunk Assessment Cervical / Trunk Assessment: Normal   Communication Communication Communication: No difficulties   Cognition Arousal/Alertness: Awake/alert Behavior During Therapy: WFL for tasks assessed/performed Overall  Cognitive Status: Within Functional Limits for tasks assessed                                       General Comments  SpO2 90% on RA, 1L O2 returned at end of session    Exercises     Shoulder Instructions      Home Living Family/patient expects to be discharged to:: Private residence Living Arrangements: Non-relatives/Friends (roommate) Available Help at Discharge: Friend(s);Available PRN/intermittently;Personal care attendant Type of Home: House Home Access: Ramped entrance Entrance Stairs-Number of Steps: 2 Entrance Stairs-Rails: Left;Right Home Layout: One level     Bathroom Shower/Tub: Teacher, early years/pre: Standard Bathroom Accessibility: Yes   Home Equipment: Shower seat;BSC/3in1;Toilet riser;Cane - quad;Wheelchair - manual;Wheelchair - power   Additional Comments: Aide comes 7 days/week for 6 hours a day. Roommate works during the day different hours.      Prior Functioning/Environment Prior Level of Function : Needs assist  Cognitive Assist : Mobility (cognitive);ADLs (cognitive)     Physical Assist : Mobility (physical);ADLs (physical) Mobility (physical): Transfers;Gait;Stairs ADLs (physical): Bathing;Dressing;IADLs Mobility Comments: Uses quad cane vs w/c for mobility. Limited household ambulator. ADLs Comments: Aide assists with showering, dressing. Pt walks to bathroom short distances with cane.        OT Problem List: Decreased strength;Decreased range of motion;Decreased activity tolerance;Impaired balance (sitting and/or standing);Impaired UE functional use;Impaired sensation;Impaired tone;Decreased coordination      OT Treatment/Interventions: Self-care/ADL training;Therapeutic exercise;Energy conservation;DME and/or AE instruction;Therapeutic activities;Patient/family education;Balance training    OT Goals(Current goals can be found in the care plan section) Acute Rehab OT Goals Patient Stated Goal: none stated OT  Goal Formulation:  With patient Time For Goal Achievement: 03/21/23 Potential to Achieve Goals: Good ADL Goals Pt Will Perform Upper Body Dressing: with min assist;sitting Pt Will Perform Lower Body Dressing: with min assist;sit to/from stand;sitting/lateral leans Pt Will Transfer to Toilet: with min assist;ambulating;regular height toilet Pt Will Perform Tub/Shower Transfer: Shower transfer;Tub transfer;with min assist;ambulating;shower seat  OT Frequency: Min 2X/week    Co-evaluation PT/OT/SLP Co-Evaluation/Treatment: Yes Reason for Co-Treatment: For patient/therapist safety;To address functional/ADL transfers PT goals addressed during session: Mobility/safety with mobility;Balance;Strengthening/ROM        AM-PAC OT "6 Clicks" Daily Activity     Outcome Measure Help from another person eating meals?: A Little Help from another person taking care of personal grooming?: A Little Help from another person toileting, which includes using toliet, bedpan, or urinal?: A Lot Help from another person bathing (including washing, rinsing, drying)?: A Lot Help from another person to put on and taking off regular upper body clothing?: A Lot Help from another person to put on and taking off regular lower body clothing?: A Lot 6 Click Score: 14   End of Session Equipment Utilized During Treatment: Gait belt;Other (comment) (hemi-walker) Nurse Communication: Mobility status  Activity Tolerance: Patient tolerated treatment well Patient left: with call bell/phone within reach;in chair  OT Visit Diagnosis: Unsteadiness on feet (R26.81);Other abnormalities of gait and mobility (R26.89);Muscle weakness (generalized) (M62.81)                Time: [id number] OT Time Calculation (min): 25 min Charges:  OT General Charges $OT Visit: 1 Visit OT Evaluation $OT Eval Moderate Complexity: 1 Mod  Eulala Newcombe K, OTD, OTR/L SecureChat Preferred Acute Rehab [phone]  Renaye Rakers Koonce 03/07/2023, 4:08  PM

## 2023-03-07 NOTE — Progress Notes (Signed)
Orthopedic Tech Progress Note Patient Details:  [name] 1966-05-09 [id number]  Ortho Devices Type of Ortho Device: Postop shoe/boot Ortho Device/Splint Location: RLE Ortho Device/Splint Interventions: Ordered   Post Interventions Patient Tolerated: Well Instructions Provided: Care of device  [name] 03/07/2023, 3:47 PM

## 2023-03-07 NOTE — Telephone Encounter (Signed)
Patient is currently admitted - inpatient team will send in refills if appropriate.

## 2023-03-07 NOTE — Evaluation (Signed)
Physical Therapy Evaluation Patient Details Name: [name] MRN: [id number] DOB: 1966/01/18 Today's Date: 03/07/2023  History of Present Illness  Patient is a 57 y/o female who presents on 3/23 with SOB, CP and right foot pain/swelling. s/p I&D in ED and s/p I&D right foot in OR on 3/24. Found to have sepsis secondary to diabetic wound of right foot. PMH includes CVA with left hemiparesis, ICD, CHF, A-fib, DM2, CAD, obesity, OSA on CPAP, seizures.  Clinical Impression  Patient presents with pain, generalized weakness, impaired balance, baseline left hemiparesis and impaired mobility s/p above. Pt lives at home with a roommate who works and requires assist for ADLs/IADLs at baseline. Pt has an aide 7 days/week for 6 hours to assist. Uses quad cane vs w/c for mobility, walking short household distances at baseline. Today, pt requires Mod A for bed mobility, Mod A of 2 for standing and Min A of 2 for transfers and short distance ambulation in room using hemi walker. Consulted MD to order post op shoe for right foot. Pt reports feeling weaker than baseline due to not being OOB since Saturday. Encouraged OOB to chair for all meals and getting to [facility] with nursing. Would benefit from HHPT to maximize independence and mobility at home as well as decrease fall risk. Will follow acutely.     Recommendations for follow up therapy are one component of a multi-disciplinary discharge planning process, led by the attending physician.  Recommendations may be updated based on patient status, additional functional criteria and insurance authorization.  Follow Up Recommendations       Assistance Recommended at Discharge Frequent or constant Supervision/Assistance  Patient can return home with the following  A lot of help with walking and/or transfers;Assistance with cooking/housework;A lot of help with bathing/dressing/bathroom;Help with stairs or ramp for entrance;Assist for transportation    Equipment  Recommendations None recommended by PT  Recommendations for Other Services       Functional Status Assessment Patient has had a recent decline in their functional status and demonstrates the ability to make significant improvements in function in a reasonable and predictable amount of time.     Precautions / Restrictions Precautions Precautions: Fall Precaution Comments: left hemiparesis Required Braces or Orthoses: Other Brace Other Brace: post op shoe on right (to be delivered 3/25) Restrictions Weight Bearing Restrictions: Yes RLE Weight Bearing: Weight bearing as tolerated      Mobility  Bed Mobility Overal bed mobility: Needs Assistance Bed Mobility: Supine to Sit     Supine to sit: Mod assist, HOB elevated     General bed mobility comments: Assist with LLE and trunk to get to EOB with increased time as well as to scoot bottom to EOB.    Transfers Overall transfer level: Needs assistance Equipment used: Hemi-walker Transfers: Sit to/from Stand, Bed to chair/wheelchair/BSC Sit to Stand: Mod assist, +2 physical assistance   Step pivot transfers: Min assist, +2 physical assistance, +2 safety/equipment       General transfer comment: Assist of 2 to power to standing with use of momentum and anterior weight shift as well as hemi walker for RUE. ABle to take a few steps to get to chair with Min A of 2 for balance and weight shifting to advance LLE.    Ambulation/Gait Ambulation/Gait assistance: Min assist, +2 safety/equipment Gait Distance (Feet): 5 Feet Assistive device: Hemi-walker Gait Pattern/deviations: Step-to pattern, Decreased stance time - left, Decreased dorsiflexion - left, Wide base of support Gait velocity: decreased Gait velocity interpretation: <  1.8 ft/sec, indicate of risk for recurrent falls   General Gait Details: Slow, hemiparetic gait with decreased foot clearance LLE and difficulty advancing LLE initially, using hemiw alker for support and close  chair follow. LLE locked out into knee extension during stance phase to prevent buckling.  Stairs            Wheelchair Mobility    Modified Rankin (Stroke Patients Only)       Balance Overall balance assessment: Needs assistance Sitting-balance support: Feet supported, No upper extremity supported Sitting balance-Leahy Scale: Fair Sitting balance - Comments: supervision for safety.   Standing balance support: During functional activity, Single extremity supported Standing balance-Leahy Scale: Poor Standing balance comment: Requires UE support and external support                             Pertinent Vitals/Pain Pain Assessment Pain Assessment: 0-10 Pain Score: 7  Pain Location: right foot Pain Descriptors / Indicators: Sore, Discomfort, Grimacing, Guarding Pain Intervention(s): Limited activity within patient's tolerance, Monitored during session, Repositioned    Home Living Family/patient expects to be discharged to:: Private residence Living Arrangements: Non-relatives/Friends (roommate) Available Help at Discharge: Friend(s);Available PRN/intermittently;Personal care attendant Type of Home: House Home Access: Ramped entrance Entrance Stairs-Rails: Left;Right Entrance Stairs-Number of Steps: 2   Home Layout: One level Home Equipment: Shower seat;BSC/3in1;Toilet riser;Cane - quad;Wheelchair - manual;Wheelchair - power Additional Comments: Aide comes 7 days/week for 6 hours a day. Roommate works during the day different hours.    Prior Function Prior Level of Function : Needs assist       Physical Assist : Mobility (physical);ADLs (physical) Mobility (physical): Transfers;Gait;Stairs ADLs (physical): Bathing;Dressing;IADLs Mobility Comments: Uses quad cane vs w/c for mobility. Limited household ambulator. ADLs Comments: Aide assists with showering, dressing. Pt walks to bathroom short distances with cane.     Hand Dominance   Dominant Hand:  Right    Extremity/Trunk Assessment   Upper Extremity Assessment Upper Extremity Assessment: Defer to OT evaluation;LUE deficits/detail LUE Deficits / Details: Non functional due to prior CVA, hemiparetic    Lower Extremity Assessment Lower Extremity Assessment: LLE deficits/detail;RLE deficits/detail RLE Deficits / Details: Able to wiggle toes and perform SLR RLE Sensation: decreased light touch LLE Deficits / Details: hemiparesis present throughout LLE with limited AROM at ankle, knee and hip. LLE Sensation: decreased light touch LLE Coordination: decreased gross motor;decreased fine motor    Cervical / Trunk Assessment Cervical / Trunk Assessment: Normal  Communication   Communication: No difficulties  Cognition Arousal/Alertness: Awake/alert Behavior During Therapy: WFL for tasks assessed/performed Overall Cognitive Status: Within Functional Limits for tasks assessed                                 General Comments: Sleepy but awake enough to participate in therapy        General Comments General comments (skin integrity, edema, etc.): Sp02 remained 90% on RA during activity. Placed pt on 1L post session.    Exercises     Assessment/Plan    PT Assessment Patient needs continued PT services  PT Problem List Decreased balance;Pain;Impaired sensation;Obesity;Impaired tone;Decreased strength;Decreased mobility;Decreased range of motion;Decreased skin integrity       PT Treatment Interventions Therapeutic activities;Gait training;Therapeutic exercise;Patient/family education;Balance training;Functional mobility training;Wheelchair mobility training    PT Goals (Current goals can be found in the Care Plan section)  Acute Rehab PT Goals  Patient Stated Goal: to go home, get up and move PT Goal Formulation: With patient Time For Goal Achievement: 03/21/23 Potential to Achieve Goals: Fair    Frequency Min 3X/week     Co-evaluation PT/OT/SLP  Co-Evaluation/Treatment: Yes Reason for Co-Treatment: For patient/therapist safety;To address functional/ADL transfers PT goals addressed during session: Mobility/safety with mobility;Balance;Strengthening/ROM         AM-PAC PT "6 Clicks" Mobility  Outcome Measure Help needed turning from your back to your side while in a flat bed without using bedrails?: A Lot Help needed moving from lying on your back to sitting on the side of a flat bed without using bedrails?: A Lot Help needed moving to and from a bed to a chair (including a wheelchair)?: A Lot Help needed standing up from a chair using your arms (e.g., wheelchair or bedside chair)?: A Lot Help needed to walk in hospital room?: Total Help needed climbing 3-5 steps with a railing? : Total 6 Click Score: 10    End of Session Equipment Utilized During Treatment: Gait belt Activity Tolerance: Patient tolerated treatment well Patient left: in chair;with call bell/phone within reach;Other (comment) (with OT) Nurse Communication: Mobility status PT Visit Diagnosis: Pain;Unsteadiness on feet (R26.81);Muscle weakness (generalized) (M62.81) Pain - Right/Left: Right Pain - part of body: Ankle and joints of foot    Time: 1328-1405 PT Time Calculation (min) (ACUTE ONLY): 37 min   Charges:   PT Evaluation $PT Eval Moderate Complexity: 1 Mod          Marisa Severin, PT, DPT Acute Rehabilitation Services Secure chat preferred Office [place] 03/07/2023, 3:18 PM

## 2023-03-07 NOTE — Plan of Care (Signed)

## 2023-03-07 NOTE — Progress Notes (Signed)
HD#1 Subjective:   Summary: This is a 57 year old female with a past medical history of type 2 diabetes, A-fib on Xarelto, HFrEF who presented for chest pain.  Patient found to have right lower extremity erythema and edema.  Admitted for further evaluation and management of sepsis secondary to right fifth metatarsal osteomyelitis and diabetic foot infection.  Overnight Events: No overnight events  Patient states she is doing well.  She does report having a headache this morning.  She states his headache is almost out of her head, she denies any vision changes or lacrimation.  She states that last for awhile.  Patient wear oxygen this morning.  She denies any snoring.  She denies any chest pain or shortness of breath.  She does report feeling weak this morning and a little groggy.   Objective:  Vital signs in last 24 hours: Vitals:   03/06/23 1725 03/06/23 1726 03/06/23 2114 03/07/23 0546  BP:   122/65 129/75  Pulse:   91 79  Resp:   18 18  Temp:   99.8 F (37.7 C) 98.6 F (37 C)  TempSrc:   Oral Oral  SpO2: 92% 95% 92% 98%  Weight:      Height:       Supplemental O2: Room Air SpO2: 100 %  Physical Exam:  Constitutional: Resting in bed, no acute distress HENT: Normocephalic, atraumatic Eyes: conjunctiva non-erythematous Cardiovascular: regular rate and rhythm, no m/r/g Pulmonary/Chest: normal work of breathing on room air, lungs clear to auscultation bilaterally Abdominal: soft, non-tender, non-distended Extremities: Right lower with Ace bandage wrapped.  Great toe movement.  Good sensation.  No bleeding through the bandage appreciated.  Filed Weights   03/05/23 1437 03/06/23 0432 03/06/23 0823  Weight: 99.8 kg 109.1 kg 109.1 kg     Intake/Output Summary (Last 24 hours) at 03/07/2023 [id number] Last data filed at 03/07/2023 0157 Gross per 24 hour  Intake 1202.54 ml  Output 550 ml  Net 652.54 ml    Net IO Since Admission: 724.79 mL [03/07/23 0608]  Pertinent Labs:     Latest Ref Rng & Units 03/06/2023    1:35 AM 03/05/2023    4:26 PM 09/15/2022    6:07 AM  CBC  WBC 4.0 - 10.5 K/uL 14.8  16.9  17.0   Hemoglobin 12.0 - 15.0 g/dL 10.0  10.9  10.8   Hematocrit 36.0 - 46.0 % 30.7  33.0  33.4   Platelets 150 - 400 K/uL 220  211  259        Latest Ref Rng & Units 03/06/2023    1:35 AM 03/05/2023    3:17 PM 09/15/2022    6:07 AM  CMP  Glucose 70 - 99 mg/dL 360  277  82   BUN 6 - 20 mg/dL 8  7  8    Creatinine 0.44 - 1.00 mg/dL 0.90  0.84  1.07   Sodium 135 - 145 mmol/L 130  133  137   Potassium 3.5 - 5.1 mmol/L 3.5  3.2  3.5   Chloride 98 - 111 mmol/L 98  94  99   CO2 22 - 32 mmol/L 24  25  26    Calcium 8.9 - 10.3 mg/dL 8.4  9.0  8.7     Imaging: No results found.  Assessment/Plan:   Principal Problem:   Sepsis ([place]) Active Problems:   Benign essential HTN   Chronic combined systolic and diastolic heart failure ([place])   ICD (implantable cardioverter-defibrillator) in place  Hemiparesis affecting left side as late effect of cerebrovascular accident [facility])   Poorly controlled type 2 diabetes mellitus with neuropathy ([place])   Atrial fibrillation ([place])   Diabetic foot infection ([place])   Patient Summary: [name] is a 57 y.o. person living with a history of type 2 diabetes, HFrEF, atrial fibrillation who presents for chest pain.  Patient found to have right foot erythema and swelling concerning for cellulitis.  Patient was SIRS positive with likely source of infection from foot wound.  Patient admitted for further evaluation and management of sepsis likely secondary to diabetic foot wound of right foot.   #Sepsis secondary to diabetic foot wound of right foot #Osteomyelitis of right 5th metatarsal head #Leukocytosis, stable Patient reevaluated this morning.  Patient is postop day 1.  She states her pain is improving.  On exam this morning, patient has right lower extremity bandage.  No blood seen through bandage.  Cultures this morning showing  gram-positive cocci in pairs.  ABI still pending.  Ortho following.  Will continue with pain control.  Plan will be to continue with broad-spectrum antibiotics, and plan to narrow once cultures return.  Patient will need outpatient antibiotic management, likely will need ID follow-up for outpatient management.  Will consult after sensitivities return. -Zosyn day 3 -Linezolid day 3 -Multimodal pain control with Tylenol and oxycodone -Continue to follow cultures for sensitivities -Ortho following, appreciate recs -Follow-up ABIs -Monitor white count and fever curve -Will need OPAT orders placed once sensitivities return  #Pseudohyponatremia likely secondary to hyperglycemia #Hypokalemia, resolved #Hypomagnesemia Sodium corrects well with hyperglycem ia. Other electrolytes have been repleted. -Monitor BMP    #CAD #HFrEF Patient has a history of CAD and HFrEF.  No acute concern for exacerbation.  Patient remains hemodynamically stable. Continue home medications.  Holding home hydralazine 25 mg 3 times daily. -Continue home metoprolol succinate 1 mg daily -Continue home Imdur 90 mg daily -Continue home spironolactone 1 mg daily -Hold home hydralazine 25 mg 3 times daily -Continue home Lipitor 40 mg daily   #Atrial fibrillation Patient remains rate controlled.  Hemoglobin did trend down slightly overnight likely secondary to acute blood loss from surgery.  Will hold Xarelto overnight. -Continue metoprolol 100 mg daily -Continue on telemetry -Hold Xarelto 20 mg daily   #Hypertension Blood pressures remained stable during hospitalization.  Will continue home amlodipine, Imdur, metoprolol succinate, and spironolactone.  Continue with Lasix. -Hold hydralazine 25 mg 3 times daily -Continue monitor blood pressure blood pressures are slowly come down with most recent blood pressure at 119/72.   #Type II Diabetes Mellitus  Did resume 25 units of low acting insulin.  Blood sugars are slowly  coming down.  Patient did require short acting yesterday.  A1c 10.4.  Still remains uncontrolled.  Will increase his long-acting to 30 units.  -Increase to Home dose of 30 units of long-acting insulin -Sliding scale insulin -Continue to monitor CBGs   #Anxiety and depression -Continue home Lexapro 10 mg daily -Continue home BuSpar 7.5 mg twice daily   #Seizure disorder No acute concerns -Continue Keppra 500 mg twice daily   Diet: NPO IVF: None,None VTE: SCDs, likely to resume Xarelto tomorrow as long as hemoglobin mains stable Code: Full PT/OT recs: Pending   Dispo: Anticipated discharge to Home in 2 days pending clinical improvement.   [facility] Resident PGY-1 [phone] Please contact the on call pager after 5 pm and on weekends at [phone].

## 2023-03-07 NOTE — Progress Notes (Addendum)
  Progress Note   Date: 03/07/2023  Patient Name: [name]        MRN#: [id number]   Clarification of diagnosis:   obesity  Patient has paroxysmal atrial fibrillation

## 2023-03-08 ENCOUNTER — Encounter (HOSPITAL_COMMUNITY): Payer: Self-pay

## 2023-03-08 ENCOUNTER — Inpatient Hospital Stay (HOSPITAL_COMMUNITY): Payer: Medicaid Other

## 2023-03-08 ENCOUNTER — Telehealth (HOSPITAL_COMMUNITY): Payer: Medicaid Other | Admitting: Physician Assistant

## 2023-03-08 DIAGNOSIS — A419 Sepsis, unspecified organism: Secondary | ICD-10-CM | POA: Diagnosis not present

## 2023-03-08 DIAGNOSIS — E1165 Type 2 diabetes mellitus with hyperglycemia: Secondary | ICD-10-CM | POA: Diagnosis not present

## 2023-03-08 DIAGNOSIS — L03115 Cellulitis of right lower limb: Secondary | ICD-10-CM

## 2023-03-08 DIAGNOSIS — L02611 Cutaneous abscess of right foot: Secondary | ICD-10-CM | POA: Diagnosis not present

## 2023-03-08 LAB — BASIC METABOLIC PANEL
Anion gap: 9 (ref 5–15)
BUN: 9 mg/dL (ref 6–20)
CO2: 27 mmol/L (ref 22–32)
Calcium: 8.5 mg/dL — ABNORMAL LOW (ref 8.9–10.3)
Chloride: 97 mmol/L — ABNORMAL LOW (ref 98–111)
Creatinine, Ser: 0.97 mg/dL (ref 0.44–1.00)
GFR, Estimated: >60 mL/min (ref >60)
Glucose, Bld: 124 mg/dL — ABNORMAL HIGH (ref 70–99)
Potassium: 3.6 mmol/L (ref 3.5–5.1)
Sodium: 133 mmol/L — ABNORMAL LOW (ref 135–145)

## 2023-03-08 LAB — GLUCOSE, CAPILLARY
Glucose-Capillary: 122 mg/dL — ABNORMAL HIGH (ref 70–99)
Glucose-Capillary: 232 mg/dL — ABNORMAL HIGH (ref 70–99)
Glucose-Capillary: 174 mg/dL — ABNORMAL HIGH (ref 70–99)
Glucose-Capillary: 288 mg/dL — ABNORMAL HIGH (ref 70–99)

## 2023-03-08 LAB — CBC
HCT: 29.3 % — ABNORMAL LOW (ref 36.0–46.0)
Hemoglobin: 9.4 g/dL — ABNORMAL LOW (ref 12.0–15.0)
MCH: 26.7 pg (ref 26.0–34.0)
MCHC: 32.1 g/dL (ref 30.0–36.0)
MCV: 83.2 fL (ref 80.0–100.0)
Platelets: 215 10*3/uL (ref 150–400)
RBC: 3.52 MIL/uL — ABNORMAL LOW (ref 3.87–5.11)
RDW: 12.6 % (ref 11.5–15.5)
WBC: 11.3 10*3/uL — ABNORMAL HIGH (ref 4.0–10.5)
nRBC: 0 % (ref 0.0–0.2)

## 2023-03-08 LAB — VAS US ABI WITH/WO TBI
Left ABI: 1.26
Right ABI: 1.01

## 2023-03-08 MED ORDER — RIVAROXABAN 20 MG PO TABS: 20.0000 mg | ORAL_TABLET | Freq: Every day | ORAL | Status: DC

## 2023-03-08 MED ORDER — ENOXAPARIN SODIUM 40 MG/0.4ML IJ SOSY
40.0000 mg | PREFILLED_SYRINGE | INTRAMUSCULAR | Status: DC
  Administered 2023-03-08 – 2023-03-10 (×3): 40 mg via SUBCUTANEOUS
  Filled 2023-03-08 (×3): qty 0.4

## 2023-03-08 NOTE — TOC Initial Note (Signed)
Transition of Care [facility]) - Initial/Assessment Note    Patient Details  Name: [name] MRN: [id number] Date of Birth: [birth year]  [facility] CM/SW Contact:    Tom-Johnson, Renea Ee, RN Phone Number: 03/08/2023, 3:16 PM  Clinical Narrative:                  CM spoke with patient at bedside about needs for post hospital transition.  Admitted for Sepsis 2/2 Rt foot Diabetic ulcer/right fifth Metatarsal Osteomyelitis. Patient had I&D on 03/06/23 and Ortho plans another debridement tomorrow 03/09/23 if no improvement.   On IV abx. Diabetes Coordinator following.  Has Hx of Stroke with Left sided weakness deficit. Patient is from home with room mate. Has two supportive children and six siblings. Not employed, on disability. Does not drive, roommate transports to and from appointments and assists with errands. Has a walker, w/c and shower seat at home.  Patient has PCS 6hrs/day, 7 dys/week by [facility] in [place].  Patient states she has no preference for Home health recommendations. CM called in referral to [facility] and [facility] voiced acceptance, info on AVS.  PCP is Dorethea Clan, DO and uses [facility] on [address] in [place], [place]. [place] will continue to follow as patient progresses with care towards discharge.          Expected Discharge Plan: [place] Barriers to Discharge: Continued Medical Work up   Patient Goals and CMS Choice Patient states their goals for this hospitalization and ongoing recovery are:: To return home CMS Medicare.gov Compare Post Acute Care list provided to:: Patient Choice offered to / list presented to : Patient      Expected Discharge Plan and Services   Discharge Planning Services: CM Consult Post Acute Care Choice: [place] arrangements for the past 2 months: Single Family Home                 DME Arranged: N/A DME Agency: NA       HH Arranged: PT, OT, RN (Wound  care.) HH Agency: [facility] Date [facility] Agency Contacted: 03/07/23 Time [place]: 59 Representative spoke with at [place]: Claib[facility] Billings  Prior Living Arrangements/Services Living arrangements for the past 2 months: [place] with:: Roommate Patient language and need for interpreter reviewed:: Yes Do you feel safe going back to the place where you live?: Yes      Need for Family Participation in Patient Care: Yes (Comment) Care giver support system in place?: Yes (comment) Current home services: DME [facility], w/c shower seat.) Criminal Activity/Legal Involvement Pertinent to Current Situation/Hospitalization: No - Comment as needed  Activities of Daily Living Home Assistive Devices/Equipment: None ADL Screening (condition at time of admission) Patient's cognitive ability adequate to safely complete daily activities?: Yes Is the patient deaf or have difficulty hearing?: No Does the patient have difficulty seeing, even when wearing glasses/contacts?: No Does the patient have difficulty concentrating, remembering, or making decisions?: No Patient able to express need for assistance with ADLs?: Yes Does the patient have difficulty dressing or bathing?: Yes Independently performs ADLs?: Yes (appropriate for developmental age) Does the patient have difficulty walking or climbing stairs?: Yes Weakness of Legs: Both Weakness of Arms/Hands: Left  Permission Sought/Granted Permission sought to share information with : Case Manager, Customer service manager, Family Supports Permission granted to share information with : Yes, Verbal Permission Granted  Emotional Assessment Appearance:: Appears stated age Attitude/Demeanor/Rapport: Engaged, Gracious Affect (typically observed): Accepting, Appropriate, Calm, Hopeful, Pleasant Orientation: : Oriented to Self, Oriented to Place, Oriented to  Time, Oriented to Situation Alcohol / Substance  Use: Not Applicable Psych Involvement: No (comment)  Admission diagnosis:  Foot abscess, right [L02.611] Cellulitis of right lower extremity [L03.115] Diabetic foot infection ([place]) [id number], L08.9] Patient Active Problem List   Diagnosis Date Noted   Foot abscess, right 03/08/2023   Cellulitis of right lower extremity 03/07/2023   Type 2 diabetes mellitus with hyperglycemia, with long-term current use of insulin ([place]) 03/07/2023   Diabetic foot infection ([place]) 03/05/2023   Sepsis ([place]) 03/05/2023   PTSD (post-traumatic stress disorder) 01/20/2023   Anxiety state 01/20/2023   Insomnia due to other mental disorder 01/20/2023   Encounter for screening involving social determinants of health (SDoH) 11/18/2022   Cough 11/02/2022   Tinea cruris 09/13/2022   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   Atrial fibrillation ([place]) 09/13/2019   Vitamin B12 deficiency 09/15/2017   Lumbar radiculopathy 08/03/2017   Constipation 09/09/2015   Localization-related symptomatic epilepsy and epileptic syndromes with complex partial seizures, not intractable, without status epilepticus ([place]) 06/13/2015   Hemiparesis affecting left side as late effect of cerebrovascular accident ([place]) 06/13/2015   Morbid obesity ([place]) 06/13/2015   Candidal intertrigo 11/05/2014   Vitamin D deficiency 10/01/2014   Tachycardia 07/04/2014   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/23/2013   Mixed stress and urge urinary incontinence 10/11/2013   Health care maintenance 09/13/2013   Chronic combined systolic and diastolic heart failure ([place]) 08/01/2013   Nonischemic cardiomyopathy ([place]) 08/01/2013   LBBB (left bundle branch block) 07/19/2013   Depression 04/09/2013   Primary osteoarthritis of left knee 04/14/2012   Lumbar spondylosis 04/14/2012   Poorly controlled type 2 diabetes mellitus with neuropathy ([place]) 04/14/2012   Hyperlipidemia 11/23/2011   OSA (obstructive sleep apnea) 08/27/2011   Benign essential  HTN 08/27/2010   PCP:  Dorethea Clan, DO Pharmacy:   [place], [place] - [place] [phone] [place] [address] Phone: [phone] Fax: [place] [address] [place] [address] Phone: [phone] Fax: [phone]     Social Determinants of Health (SDOH) Social History: SDOH Screenings   Food Insecurity: Food Insecurity Present (11/18/2022)  Housing: High Risk (11/18/2022)  Transportation Needs: Unmet Transportation Needs (11/18/2022)  Utilities: Patient Declined (11/18/2022)  Depression (PHQ2-9): High Risk (01/20/2023)  Financial Resource Strain: Patient Declined (11/18/2022)  Tobacco Use: Low Risk  (03/07/2023)   SDOH Interventions:     Readmission Risk Interventions     No data to display

## 2023-03-08 NOTE — Progress Notes (Signed)
ABI study completed.   Please see CV Procedures for preliminary results.  Lyndsey Demos, RVT  1:37 PM 03/08/23

## 2023-03-08 NOTE — Progress Notes (Signed)
PT Cancellation Note  Patient Details Name: [name] MRN: [id number] DOB: 1966/02/14   Cancelled Treatment:    Reason Eval/Treat Not Completed: (P) Patient declined, no reason specified (pt declining x2 attempts despite education and encouragement stating fatigue and needing to "get my strength back", educated pt on importance of mobility to regain strength, pt continuing to request rest.) Will check back as schedule allows to continue with PT POC.  Audry Riles. PTA Acute Rehabilitation Services Office: [place] 03/08/2023, 4:17 PM

## 2023-03-08 NOTE — Inpatient Diabetes Management (Signed)
Inpatient Diabetes Program Recommendations  AACE/ADA: New Consensus Statement on Inpatient Glycemic Control (2015)  Target Ranges:  Prepandial:   less than 140 mg/dL      Peak postprandial:   less than 180 mg/dL (1-2 hours)      Critically ill patients:  140 - 180 mg/dL   Lab Results  Component Value Date   GLUCAP 232 (H) 03/08/2023   HGBA1C 10.4 (H) 03/06/2023    Review of Glycemic Control   Pt not in room at time of visit will see tomorrow regarding A1c of 10.4 % and glucose control at home.   Thanks,  Tama Headings RN, MSN, BC-ADM Inpatient Diabetes Coordinator Team Pager [phone] (8a-5p)

## 2023-03-08 NOTE — Inpatient Diabetes Management (Signed)
Inpatient Diabetes Program Recommendations  AACE/ADA: New Consensus Statement on Inpatient Glycemic Control (2015)  Target Ranges:  Prepandial:   less than 140 mg/dL      Peak postprandial:   less than 180 mg/dL (1-2 hours)      Critically ill patients:  140 - 180 mg/dL   Lab Results  Component Value Date   GLUCAP 232 (H) 03/08/2023   HGBA1C 10.4 (H) 03/06/2023    Review of Glycemic Control  Latest Reference Range & Units 03/07/23 07:35 03/07/23 11:19 03/07/23 16:37 03/08/23 07:27 03/08/23 11:07  Glucose-Capillary 70 - 99 mg/dL 191 (H) 317 (H) 307 (H) 122 (H) 232 (H)   Diabetes history: DM 2 Outpatient Diabetes medications: Trulicity [id number] mg weekly, Lantus 25 units Daily Current orders for Inpatient glycemic control:  Levemir 30 units qhs Novolog 0-15 units tid  A1c 10.4% on 3/24  Inpatient Diabetes Program Recommendations:     Glucose trends increase after PO intake  -   Consider adding Novolog 4 units tid meal coverage if eating >50% of meals.  Thanks,  Tama Headings RN, MSN, BC-ADM Inpatient Diabetes Coordinator Team Pager [phone] (8a-5p)

## 2023-03-08 NOTE — Progress Notes (Signed)
HD#2 Subjective:   Summary: This is a 57 year old female with a past medical history of type 2 diabetes, A-fib on Xarelto, HFrEF who presented for chest pain.  Patient found to have right lower extremity erythema and edema.  Admitted for further evaluation and management of sepsis secondary to right fifth metatarsal osteomyelitis and diabetic foot infection.  Overnight Events: No overnight events  Patient states she is doing well.  She does report that she would like to go home.  She denies any fever or chills.  She states she had some trouble getting up with PT/OT yesterday.  She states the boot might help her.  She denies any headaches, chest pain, shortness of breath.  Objective:  Vital signs in last 24 hours: Vitals:   03/07/23 1555 03/07/23 2024 03/08/23 0416 03/08/23 0819  BP: 118/70 133/74 124/62 124/68  Pulse: 72 83 81 91  Resp: 18 16  19   Temp: 98.6 F (37 C) 99.9 F (37.7 C) (!) 100.5 F (38.1 C) 98.9 F (37.2 C)  TempSrc: Oral Oral Oral Oral  SpO2: 98% 96% 94% 95%  Weight:      Height:       Supplemental O2: Room Air SpO2: 100 %  Physical Exam:  Constitutional: Resting in bed, no acute distress HENT: Normocephalic, atraumatic Eyes: conjunctiva non-erythematous Cardiovascular: regular rate and rhythm, no m/r/g Pulmonary/Chest: normal work of breathing on room air, lungs clear to auscultation bilaterally Abdominal: soft, non-tender, non-distended Extremities: Right lower extremity with Ace bandage wrapped.  No bleeding or drainage noted through wraps.  Able to move extremities with no concerns.  Sensation all bilaterally equal.  Bilateral legs symmetrical.  Filed Weights   03/05/23 1437 03/06/23 0432 03/06/23 0823  Weight: 99.8 kg 109.1 kg 109.1 kg     Intake/Output Summary (Last 24 hours) at 03/08/2023 1347 Last data filed at 03/08/2023 0900 Gross per 24 hour  Intake 593.88 ml  Output 1100 ml  Net -506.12 ml   Net IO Since Admission: 992.9 mL [03/08/23  1347]  Pertinent Labs:    Latest Ref Rng & Units 03/08/2023    4:05 AM 03/07/2023    6:27 AM 03/06/2023    1:35 AM  CBC  WBC 4.0 - 10.5 K/uL 11.3  14.3  14.8   Hemoglobin 12.0 - 15.0 g/dL 9.4  8.9  10.0   Hematocrit 36.0 - 46.0 % 29.3  27.4  30.7   Platelets 150 - 400 K/uL 215  194  220        Latest Ref Rng & Units 03/08/2023    4:05 AM 03/07/2023    6:27 AM 03/06/2023    1:35 AM  CMP  Glucose 70 - 99 mg/dL 124  218  360   BUN 6 - 20 mg/dL 9  9  8    Creatinine 0.44 - 1.00 mg/dL 0.97  1.04  0.90   Sodium 135 - 145 mmol/L 133  132  130   Potassium 3.5 - 5.1 mmol/L 3.6  3.6  3.5   Chloride 98 - 111 mmol/L 97  98  98   CO2 22 - 32 mmol/L 27  24  24    Calcium 8.9 - 10.3 mg/dL 8.5  8.3  8.4     Imaging: VAS [place] ABI WITH/WO TBI  Result Date: 03/08/2023  LOWER EXTREMITY DOPPLER STUDY Patient Name:  [name]  Date of Exam:   03/08/2023 Medical Rec #: [id number]         Accession #:  [id number] Date of Birth: 06-27-1966         Patient Gender: F Patient Age:   34 years Exam Location:  [facility] Procedure:      VAS [place] ABI WITH/WO TBI Referring Phys: [facility] 2 GAWNE --------------------------------------------------------------------------------  High Risk Factors: Hypertension, hyperlipidemia, Diabetes, prior CVA.  Comparison Study: No prior study. Performing Technologist: McKayla Maag RVT, [place]  Examination Guidelines: A complete evaluation includes at minimum, Doppler waveform signals and systolic blood pressure reading at the level of bilateral brachial, anterior tibial, and posterior tibial arteries, when vessel segments are accessible. Bilateral testing is considered an integral part of a complete examination. Photoelectric Plethysmograph (PPG) waveforms and toe systolic pressure readings are included as required and additional duplex testing as needed. Limited examinations for reoccurring indications may be performed as noted.  ABI Findings:  +---------+------------------+-----+---------+--------+ Right    Rt Pressure (mmHg)IndexWaveform Comment  +---------+------------------+-----+---------+--------+ Brachial 141                    triphasic         +---------+------------------+-----+---------+--------+ PTA      143               1.01 triphasic         +---------+------------------+-----+---------+--------+ DP       140               0.99 triphasic         +---------+------------------+-----+---------+--------+ Great Toe125               0.89 Normal            +---------+------------------+-----+---------+--------+ +---------+------------------+-----+---------+-------+ Left     Lt Pressure (mmHg)IndexWaveform Comment +---------+------------------+-----+---------+-------+ Brachial 133                    triphasic        +---------+------------------+-----+---------+-------+ PTA      171               1.21 triphasic        +---------+------------------+-----+---------+-------+ DP       177               1.26 triphasic        +---------+------------------+-----+---------+-------+ Great Toe165               1.17 Normal           +---------+------------------+-----+---------+-------+ +-------+-----------+-----------+------------+------------+ ABI/TBIToday's ABIToday's TBIPrevious ABIPrevious TBI +-------+-----------+-----------+------------+------------+ Right  1.01       0.89                                +-------+-----------+-----------+------------+------------+ Left   1.26       1.17                                +-------+-----------+-----------+------------+------------+  Summary: Right: Resting right ankle-brachial index is within normal range. The right toe-brachial index is normal. Left: Resting left ankle-brachial index is within normal range. The left toe-brachial index is normal. *See table(s) above for measurements and observations.     Preliminary      Assessment/Plan:   Principal Problem:   Sepsis ([place]) Active Problems:   Benign essential HTN   Chronic combined systolic and diastolic heart failure (HCC)   ICD (implantable cardioverter-defibrillator) in place   Hemiparesis affecting left side as late effect of cerebrovascular accident ([place])  Poorly controlled type 2 diabetes mellitus with neuropathy (HCC)   Atrial fibrillation (HCC)   Diabetic foot infection ([place])   Cellulitis of right lower extremity   Type 2 diabetes mellitus with hyperglycemia, with long-term current use of insulin (HCC)   Foot abscess, right   Patient Summary: [name] is a 57 y.o. person living with a history of type 2 diabetes, HFrEF, atrial fibrillation who presents for chest pain.  Patient found to have right foot erythema and swelling concerning for cellulitis.  Patient was SIRS positive with likely source of infection from foot wound.  Patient admitted for further evaluation and management of sepsis likely secondary to diabetic foot wound of right foot.   # Diabetic foot infection of right foot #Osteomyelitis of right 5th metatarsal head #Leukocytosis, resolving Patient is postop day 2 from washout.  She states her pain is improving.  On exam, patient has Ace bandage wrapped around right lower extremity.  Seems as if erythema has slowly resolved.  Edema has resolved as well.  ABI still pending.  Cultures growing Staphylococcus Lugdunensis pain.  Will await sensitivities.  With the following recommending 1 week follow-up post discharge.  Patient did have fever overnight, but white count is resolving.  Patient is improving.  Will continue to monitor.  Once sensitivities return, will likely have infectious disease involved to create plan for discharge as patient will need long-term antibiotics. -Zosyn day 4 -Linezolid day 4 -Multimodal pain control with Tylenol and oxycodone -Continue to follow cultures for sensitivities -Ortho following,  recommending 1 week follow-up -Follow-up ABIs -Monitor white count and fever curve -Will need OPAT orders placed once sensitivities return   #CAD #HFrEF No concern for acute exacerbation at this time.  Patient remains hemodynamically stable.  Continue home medications except for hydralazine 25 mg 3 times daily. -Continue home metoprolol succinate 100 mg daily -Continue home Imdur 90 mg daily -Continue home spironolactone 100 mg daily -Hold home hydralazine 25 mg 3 times daily -Continue home Lipitor 40 mg daily -Continue Lasix 40 mg Monday and Thursdays   #Atrial fibrillation Patient remains rate controlled.  She remains hemodynamically stable.  Hemoglobin stable.  Will resume Xarelto. -Continue metoprolol 100 mg daily -Resume Xarelto 20 mg daily -Continue telemetry   #Hypertension Blood pressure remained stable during hospitalization. -Continue home amlodipine, Imdur, metoprolol succinate, and spironolactone.   -Continue Lasix blood pressures remained stable during hospitalization.  Will continue home amlodipine, Imdur, metoprolol succinate, and spironolactone.  Continue with Lasix Monday and Thursday -Hold hydralazine 25 mg 3 times daily  #Type II Diabetes Mellitus  Patient on 30 units of long-acting insulin.  Fasting glucose this morning 124.  Will monitor blood sugars to see if patient needs short acting insulin. -Continue 30 units of insulin -Sliding scale insulin -Continue to monitor CBGs -Goal blood glucose level less than 180   #Anxiety and depression -Continue home Lexapro 10 mg daily -Continue home BuSpar 7.5 mg twice daily   #Seizure disorder No acute concerns, patient remains seizure-free -Continue Keppra 500 mg twice daily   Diet: NPO IVF: None,None VTE: DOAC, Code: Full PT/OT recs: Home Health   Dispo: Anticipated discharge to Home in 1 days pending clinical improvement.   [place] Internal Medicine Resident PGY-1 [phone] Please contact the on  call pager after 5 pm and on weekends at [phone].

## 2023-03-08 NOTE — Progress Notes (Signed)
Subjective: Patient reports pain as mild to moderate in the foot. Feels better then before surgery. Had slight fever overnight. Tolerating diet.  Urinating.   No CP, SOB.  Has mobilized OOB some with PT/OT but her previous stroke makes movement difficult.   Objective:   VITALS:   Vitals:   03/07/23 1555 03/07/23 2024 03/08/23 0416 03/08/23 0819  BP: 118/70 133/74 124/62 124/68  Pulse: 72 83 81 91  Resp: 18 16  19   Temp: 98.6 F (37 C) 99.9 F (37.7 C) (!) 100.5 F (38.1 C) 98.9 F (37.2 C)  TempSrc: Oral Oral Oral Oral  SpO2: 98% 96% 94% 95%  Weight:      Height:          Latest Ref Rng & Units 03/08/2023    4:05 AM 03/07/2023    6:27 AM 03/06/2023    1:35 AM  CBC  WBC 4.0 - 10.5 K/uL 11.3  14.3  14.8   Hemoglobin 12.0 - 15.0 g/dL 9.4  8.9  10.0   Hematocrit 36.0 - 46.0 % 29.3  27.4  30.7   Platelets 150 - 400 K/uL 215  194  220       Latest Ref Rng & Units 03/08/2023    4:05 AM 03/07/2023    6:27 AM 03/06/2023    1:35 AM  BMP  Glucose 70 - 99 mg/dL 124  218  360   BUN 6 - 20 mg/dL 9  9  8    Creatinine 0.44 - 1.00 mg/dL 0.97  1.04  0.90   Sodium 135 - 145 mmol/L 133  132  130   Potassium 3.5 - 5.1 mmol/L 3.6  3.6  3.5   Chloride 98 - 111 mmol/L 97  98  98   CO2 22 - 32 mmol/L 27  24  24    Calcium 8.9 - 10.3 mg/dL 8.5  8.3  8.4    Intake/Output      03/25 0701 03/26 0700 03/26 0701 03/27 0700   P.O. 720 220   IV Piggyback 778.1 50   Total Intake(mL/kg) 1498.1 (13.7) 270 (2.5)   Urine (mL/kg/hr) 1500 (0.6)    Emesis/NG output     Stool     Total Output 1500    Net -1.9 +270        Urine Occurrence  1 x   Stool Occurrence  1 x      Physical Exam: General: NAD.  Laying in bed, calm, comfortable Resp: No increased wob Cardio: regular rate and rhythm ABD soft Neurologically intact MSK Neurovascularly intact Sensation intact distally Intact pulses distally Dorsiflexion/Plantar flexion intact Can wiggle toes Calf soft and compressible Incision:  dressing C/D/I  I removed all the surgical dressings and saw minimal dried bloody drainage on the gauze. I then removed the penrose drain from the middle of the wound. 3 Nylon interrupted sutures are in place holding the wound well approximated. Erythema and edema present around wound but less than pre-op. Still quite TTP especially on the lateral and plantar aspect of the wound. She was actually pulling her foot away from me because of the pain. I was able to express ~ 3cc of purulent drainage from the distal portion of the wound near the proximal phalanx. Devitalized skin seen in center of wound with liquefied tissue deep to it. Looks grayish and slightly necrotic but not black. Below are images I took:      The lighter colored area (surrounded by erythematous areas) on  the plantar aspect of her lateral foot near the 5th MT head is where the pus was pushed out from. I'm concerned by the appearance of it that there is more purulence present despite WBCs trending down.    Assessment: 2 Days Post-Op  S/P Procedure(s) (LRB): IRRIGATION AND DEBRIDEMENT OF RIGHT FOOT (Right) by Dr. Ernesta Amble. Percell Miller on 03/06/23  Principal Problem:   Sepsis ([place]) Active Problems:   Benign essential HTN   Chronic combined systolic and diastolic heart failure ([place])   ICD (implantable cardioverter-defibrillator) in place   Hemiparesis affecting left side as late effect of cerebrovascular accident [facility])   Poorly controlled type 2 diabetes mellitus with neuropathy ([place])   Atrial fibrillation ([place])   Diabetic foot infection ([place])   Cellulitis of right lower extremity   Type 2 diabetes mellitus with hyperglycemia, with long-term current use of insulin (HCC)   Foot abscess, right   Plan: Intra-op specimen showing rare STAPHYLOCOCCUS LUGDUNENSIS  Awaiting susceptibilities Continue IV ABX Will need ID consult  I'm concerned that there is still purulent material in the foot especially near the 5th MTP joint. I  will remove her dressings and check her wound again tomorrow AM and if not improved then we will likely look at taking her back to the OR on Thursday 03/10/23 for another I&D of the right foot. Will need to be NPO at midnight for that procedure.    Advance diet Up with therapy Incentive Spirometry Elevate and Apply ice  Weightbearing: WBAT RLE Insicional and dressing care: Reinforce dressings as needed Orthopedic device(s): cast shoe Showering: Keep dressing dry VTE prophylaxis: Lovenox 40mg  qd , SCDs, ambulation Pain control: continue current regimen Follow - up plan: 1 week in the office  Contact information:  Edmonia Lynch MD, Aggie Moats PA-C  Dispo:  TBD. Will likely need [place] services.      Britt Bottom, PA-C Office [phone] 03/08/2023, 2:52 PM

## 2023-03-08 NOTE — Discharge Instructions (Addendum)
[name], It was a pleasure taking care of you at [facility] were admitted for Diabetic foot infection and osteomyelitis and treated with surgery and antibiotics. We are discharging you home now that you are doing better. Please follow the following instructions.   1) Regarding your infection, there is lot of follow-up that you need.  First and foremost, we are sending you home on new antibiotics.  The name of this antibiotic is doxycycline.  Please take this antibiotic, 100 mg two times a day.  1 time in the morning, 1 time in the evening.  2) We are also stopping your lasix right now, please talk to your doctor to see if you need to be taking it in the outpatient setting. I will also write a note to your doctor to consider if you need to be on it or not.  3) you have many follow-up appointments.  The first one is with Dr. Sharol Given.  It has not been scheduled yet, but you need to call that office and get an appointment with Dr. Sharol Given.  The phone number is on this paper.  4) The next appointment is with Dr. Allyson Sabal who works in the internal medicine center where you have your PCP.  Please go to your appointment on 03/21/2023 at 2:15 PM.  5) The final appointment is with Dr. Candiss Norse who is the infectious disease doctor. Please follow up with her at her clinic. Your appointment is on 03/24/2023 at 9:30 AM.  Please show up to this appointment.  6) Please take all of your other medications as prescribed.  I have refilled some your medications.  Please call the [facility] clinic if you need other refills.  7) I have increased your insulin at bedtime to 30 units nightly. Please make sure that you take this insulin. Ensure that you do not miss any doses and follow up with your primary care physician to ensure that your diabetes is controlled.   Take care,  Dr. Leigh Aurora, DO

## 2023-03-09 ENCOUNTER — Telehealth (HOSPITAL_COMMUNITY): Payer: Self-pay | Admitting: Pharmacy Technician

## 2023-03-09 ENCOUNTER — Other Ambulatory Visit (HOSPITAL_COMMUNITY): Payer: Self-pay

## 2023-03-09 DIAGNOSIS — A419 Sepsis, unspecified organism: Secondary | ICD-10-CM | POA: Diagnosis not present

## 2023-03-09 DIAGNOSIS — L03115 Cellulitis of right lower limb: Secondary | ICD-10-CM | POA: Diagnosis not present

## 2023-03-09 DIAGNOSIS — L02611 Cutaneous abscess of right foot: Secondary | ICD-10-CM | POA: Diagnosis not present

## 2023-03-09 DIAGNOSIS — E1165 Type 2 diabetes mellitus with hyperglycemia: Secondary | ICD-10-CM | POA: Diagnosis not present

## 2023-03-09 LAB — CBC
HCT: 28.1 % — ABNORMAL LOW (ref 36.0–46.0)
Hemoglobin: 9 g/dL — ABNORMAL LOW (ref 12.0–15.0)
MCH: 26.5 pg (ref 26.0–34.0)
MCHC: 32 g/dL (ref 30.0–36.0)
MCV: 82.6 fL (ref 80.0–100.0)
Platelets: 231 10*3/uL (ref 150–400)
RBC: 3.4 MIL/uL — ABNORMAL LOW (ref 3.87–5.11)
RDW: 12.4 % (ref 11.5–15.5)
WBC: 11.3 10*3/uL — ABNORMAL HIGH (ref 4.0–10.5)
nRBC: 0 % (ref 0.0–0.2)

## 2023-03-09 LAB — GLUCOSE, CAPILLARY
Glucose-Capillary: 183 mg/dL — ABNORMAL HIGH (ref 70–99)
Glucose-Capillary: 167 mg/dL — ABNORMAL HIGH (ref 70–99)
Glucose-Capillary: 237 mg/dL — ABNORMAL HIGH (ref 70–99)
Glucose-Capillary: 201 mg/dL — ABNORMAL HIGH (ref 70–99)
Glucose-Capillary: 175 mg/dL — ABNORMAL HIGH (ref 70–99)

## 2023-03-09 LAB — BASIC METABOLIC PANEL WITH GFR
Anion gap: 11 (ref 5–15)
BUN: 5 mg/dL — ABNORMAL LOW (ref 6–20)
CO2: 26 mmol/L (ref 22–32)
Calcium: 8.6 mg/dL — ABNORMAL LOW (ref 8.9–10.3)
Chloride: 95 mmol/L — ABNORMAL LOW (ref 98–111)
Creatinine, Ser: 0.88 mg/dL (ref 0.44–1.00)
GFR, Estimated: >60 mL/min
Glucose, Bld: 272 mg/dL — ABNORMAL HIGH (ref 70–99)
Potassium: 3.6 mmol/L (ref 3.5–5.1)
Sodium: 132 mmol/L — ABNORMAL LOW (ref 135–145)

## 2023-03-09 MED ORDER — INSULIN ASPART 100 UNIT/ML IJ SOLN
0.0000 [IU] | Freq: Three times a day (TID) | INTRAMUSCULAR | Status: DC
  Administered 2023-03-09: 7 [IU] via SUBCUTANEOUS
  Administered 2023-03-10: 4 [IU] via SUBCUTANEOUS
  Administered 2023-03-11: 11 [IU] via SUBCUTANEOUS
  Administered 2023-03-11: 7 [IU] via SUBCUTANEOUS

## 2023-03-09 NOTE — Progress Notes (Signed)
Physical Therapy Treatment Patient Details Name: [name] MRN: [id number] DOB: 17-Nov-1966 Today's Date: 03/09/2023   History of Present Illness Patient is a 57 y/o female who presents on 3/23 with SOB, CP and right foot pain/swelling. s/p I&D in ED and s/p I&D right foot in OR on 3/24. Found to have sepsis secondary to diabetic wound of right foot. PMH includes CVA with left hemiparesis, ICD, CHF, A-fib, DM2, CAD, obesity, OSA on CPAP, seizures.    PT Comments    Pt greeted supine in bed and agreeable to session with encouragement. Pt continues to be limited by L hemi-weakness and RLE pain as well as headache this session. Pt able to complete functional transfers with up to mod A to step-pivot to recliner. Pt declining further ambulation trials this session due to headache and nausea. Educated pt on importance of continued mobility with pt verbalizing understanding. Pt continues to benefit from skilled PT services to progress toward functional mobility goals.    Recommendations for follow up therapy are one component of a multi-disciplinary discharge planning process, led by the attending physician.  Recommendations may be updated based on patient status, additional functional criteria and insurance authorization.  Follow Up Recommendations       Assistance Recommended at Discharge Frequent or constant Supervision/Assistance  Patient can return home with the following A lot of help with walking and/or transfers;Assistance with cooking/housework;A lot of help with bathing/dressing/bathroom;Help with stairs or ramp for entrance;Assist for transportation   Equipment Recommendations  None recommended by PT    Recommendations for Other Services       Precautions / Restrictions Precautions Precautions: Fall Precaution Comments: left hemiparesis from CVA 9 years ago per pt report Required Braces or Orthoses: Other Brace Other Brace: post op shoe on right Restrictions Weight Bearing  Restrictions: No RLE Weight Bearing: Weight bearing as tolerated     Mobility  Bed Mobility Overal bed mobility: Needs Assistance Bed Mobility: Supine to Sit     Supine to sit: Min assist, HOB elevated     General bed mobility comments: min A for LLE, increased time, HOB elevated    Transfers Overall transfer level: Needs assistance Equipment used: Hemi-walker Transfers: Sit to/from Stand, Bed to chair/wheelchair/BSC Sit to Stand: Min assist, From elevated surface   Step pivot transfers: Mod assist       General transfer comment: min A to rise to to stand and steady once standing, mod A to step pivot to relciner for steadying assist    Ambulation/Gait               General Gait Details: pt declining further gait attempts secondary to headache and nausea   Stairs             Wheelchair Mobility    Modified Rankin (Stroke Patients Only)       Balance Overall balance assessment: Needs assistance Sitting-balance support: Feet supported, No upper extremity supported Sitting balance-Leahy Scale: Fair Sitting balance - Comments: VC's to scoot to EOB and place feet on floor. supervision for safety.   Standing balance support: During functional activity, Single extremity supported Standing balance-Leahy Scale: Poor Standing balance comment: Requires unilateral UE support and external support with hemi walker                            Cognition Arousal/Alertness: Awake/alert Behavior During Therapy: WFL for tasks assessed/performed Overall Cognitive Status: Within Functional Limits for tasks assessed  General Comments: pt reporting feeling down, declining follow up with chaplin or other resouces        Exercises      General Comments General comments (skin integrity, edema, etc.): VSS on RA      Pertinent Vitals/Pain Pain Assessment Pain Assessment: Faces Faces Pain Scale: Hurts even  more Pain Location: headache Pain Descriptors / Indicators: Headache Pain Intervention(s): Monitored during session, Limited activity within patient's tolerance, RN gave pain meds during session    Home Living Family/patient expects to be discharged to:: Private residence Living Arrangements: Non-relatives/Friends (roommate) Available Help at Discharge: Friend(s);Available PRN/intermittently;Personal care attendant Type of Home: House Home Access: Ramped entrance Entrance Stairs-Rails: Left;Right Entrance Stairs-Number of Steps: 2   Home Layout: One level Home Equipment: Shower seat;BSC/3in1;Toilet riser;Cane - quad;Wheelchair - manual;Wheelchair - power Additional Comments: Aide comes 7 days/week for 6 hours a day. Roommate works during the day different hours.    Prior Function            PT Goals (current goals can now be found in the care plan section) Acute Rehab PT Goals Patient Stated Goal: to go home, get up and move PT Goal Formulation: With patient Time For Goal Achievement: 03/21/23 Progress towards PT goals: Progressing toward goals    Frequency    Min 3X/week      PT Plan      Co-evaluation              AM-PAC PT "6 Clicks" Mobility   Outcome Measure  Help needed turning from your back to your side while in a flat bed without using bedrails?: A Lot Help needed moving from lying on your back to sitting on the side of a flat bed without using bedrails?: A Lot Help needed moving to and from a bed to a chair (including a wheelchair)?: A Lot Help needed standing up from a chair using your arms (e.g., wheelchair or bedside chair)?: A Little Help needed to walk in hospital room?: Total Help needed climbing 3-5 steps with a railing? : Total 6 Click Score: 11    End of Session   Activity Tolerance: Patient tolerated treatment well Patient left: in chair;with call bell/phone within reach Nurse Communication: Mobility status PT Visit Diagnosis:  Pain;Unsteadiness on feet (R26.81);Muscle weakness (generalized) (M62.81) Pain - Right/Left: Right Pain - part of body: Ankle and joints of foot     Time: 1141-1156 PT Time Calculation (min) (ACUTE ONLY): 15 min  Charges:  $Therapeutic Activity: 8-22 mins                     Farhan Jean R. PTA Acute Rehabilitation Services Office: [place] 03/09/2023, 12:22 PM

## 2023-03-09 NOTE — Progress Notes (Signed)
PT Cancellation Note  Patient Details Name: [name] MRN: [id number] DOB: 1966/02/15   Cancelled Treatment:    Reason Eval/Treat Not Completed: (P) Other (comment) (pt deeply sleeping, unable to wake with verbal or tactile stimuli) Will check back in afternoon as schedule allows to continue with PT POC.  Audry Riles. PTA Acute Rehabilitation Services Office: [place] 03/09/2023, 9:40 AM

## 2023-03-09 NOTE — H&P (View-Only) (Signed)
Subjective: Patient reports pain as mild to moderate in the foot. Tolerating diet.  Urinating.   No CP, SOB.  Has mobilized OOB some with PT/OT but her previous stroke makes movement difficult. She reports feeling very fatigued but not sleeping well. Having outside issues with family members causing her stress.  Objective:   VITALS:   Vitals:   03/08/23 1514 03/08/23 2110 03/09/23 0515 03/09/23 0827  BP: 125/75 134/76 139/74 123/64  Pulse: 84 76 69 79  Resp: 18 18 18 18   Temp: 98.9 F (37.2 C) 99.3 F (37.4 C) 98.1 F (36.7 C) 98 F (36.7 C)  TempSrc: Oral Oral Oral Oral  SpO2: 97% 97% 96% 96%  Weight:      Height:          Latest Ref Rng & Units 03/09/2023   12:50 AM 03/08/2023    4:05 AM 03/07/2023    6:27 AM  CBC  WBC 4.0 - 10.5 K/uL 11.3  11.3  14.3   Hemoglobin 12.0 - 15.0 g/dL 9.0  9.4  8.9   Hematocrit 36.0 - 46.0 % 28.1  29.3  27.4   Platelets 150 - 400 K/uL 231  215  194       Latest Ref Rng & Units 03/09/2023   12:50 AM 03/08/2023    4:05 AM 03/07/2023    6:27 AM  BMP  Glucose 70 - 99 mg/dL 272  124  218   BUN 6 - 20 mg/dL 5  9  9    Creatinine 0.44 - 1.00 mg/dL 0.88  0.97  1.04   Sodium 135 - 145 mmol/L 132  133  132   Potassium 3.5 - 5.1 mmol/L 3.6  3.6  3.6   Chloride 98 - 111 mmol/L 95  97  98   CO2 22 - 32 mmol/L 26  27  24    Calcium 8.9 - 10.3 mg/dL 8.6  8.5  8.3    Intake/Output      03/26 0701 03/27 0700 03/27 0701 03/28 0700   P.O. 440 300   IV Piggyback 50    Total Intake(mL/kg) 490 (4.5) 300 (2.7)   Urine (mL/kg/hr) 1700 (0.6)    Stool 0    Total Output 1700    Net -1210 +300        Urine Occurrence 4 x    Stool Occurrence 5 x       Physical Exam: General: NAD.  Sitting up in bedside chair, calm, comfortable  Resp: No increased wob Cardio: regular rate and rhythm ABD soft Neurologically intact MSK Neurovascularly intact Sensation intact distally Intact pulses distally Dorsiflexion/Plantar flexion intact Can wiggle  toes Calf soft and compressible Incision: dressing C/D/I  I removed all the surgical dressings and saw some bloody/purulent drainage on the gauze. 3 Nylon interrupted sutures are in place holding the wound well approximated. Erythema and edema present around wound. Not as TTP on the lateral and plantar aspect of the wound.  I was able to express a little more purulent drainage from the distal portion of the wound near the proximal phalanx. Devitalized skin seen in center of wound with increased amount of liquefied tissue deep to it. Looks grayish and slightly necrotic but not black. Below are images I took today 03/09/23:        Assessment: 3 Days Post-Op  S/P Procedure(s) (LRB): IRRIGATION AND DEBRIDEMENT OF RIGHT FOOT (Right) by Dr. Ernesta Amble. Murphy on 03/06/23  Principal Problem:   Sepsis ([place]) Active  Problems:   Benign essential HTN   Chronic combined systolic and diastolic heart failure (HCC)   ICD (implantable cardioverter-defibrillator) in place   Hemiparesis affecting left side as late effect of cerebrovascular accident [facility])   Poorly controlled type 2 diabetes mellitus with neuropathy (HCC)   Atrial fibrillation ([place])   Diabetic foot infection ([place])   Cellulitis of right lower extremity   Type 2 diabetes mellitus with hyperglycemia, with long-term current use of insulin (HCC)   Foot abscess, right   Plan: Intra-op specimen showing rare STAPHYLOCOCCUS LUGDUNENSIS  Awaiting susceptibilities Continue IV ABX Will need ID consult  I'm concerned that there is still purulent material in the foot especially near the 5th MTP joint. We will go ahead and take her back to the OR tomorrow on Thursday 03/10/23 for another I&D of the right foot. Will need to be NPO at midnight.    Advance diet Up with therapy Incentive Spirometry Elevate and Apply ice  Weightbearing: WBAT RLE Insicional and dressing care: Reinforce dressings as needed Orthopedic device(s): cast  shoe Showering: Keep dressing dry VTE prophylaxis: Lovenox 40mg  qd , SCDs, ambulation Pain control: continue current regimen Follow - up plan: 1 week in the office  Contact information:  Edmonia Lynch MD, Aggie Moats PA-C  Dispo:  TBD. Will likely need [place] services.      Britt Bottom, PA-C Office [phone] 03/09/2023, 9:14 AM

## 2023-03-09 NOTE — Progress Notes (Signed)
Subjective: Patient reports pain as mild to moderate in the foot. Tolerating diet.  Urinating.   No CP, SOB.  Has mobilized OOB some with PT/OT but her previous stroke makes movement difficult. She reports feeling very fatigued but not sleeping well. Having outside issues with family members causing her stress.  Objective:   VITALS:   Vitals:   03/08/23 1514 03/08/23 2110 03/09/23 0515 03/09/23 0827  BP: 125/75 134/76 139/74 123/64  Pulse: 84 76 69 79  Resp: 18 18 18 18   Temp: 98.9 F (37.2 C) 99.3 F (37.4 C) 98.1 F (36.7 C) 98 F (36.7 C)  TempSrc: Oral Oral Oral Oral  SpO2: 97% 97% 96% 96%  Weight:      Height:          Latest Ref Rng & Units 03/09/2023   12:50 AM 03/08/2023    4:05 AM 03/07/2023    6:27 AM  CBC  WBC 4.0 - 10.5 K/uL 11.3  11.3  14.3   Hemoglobin 12.0 - 15.0 g/dL 9.0  9.4  8.9   Hematocrit 36.0 - 46.0 % 28.1  29.3  27.4   Platelets 150 - 400 K/uL 231  215  194       Latest Ref Rng & Units 03/09/2023   12:50 AM 03/08/2023    4:05 AM 03/07/2023    6:27 AM  BMP  Glucose 70 - 99 mg/dL 272  124  218   BUN 6 - 20 mg/dL 5  9  9    Creatinine 0.44 - 1.00 mg/dL 0.88  0.97  1.04   Sodium 135 - 145 mmol/L 132  133  132   Potassium 3.5 - 5.1 mmol/L 3.6  3.6  3.6   Chloride 98 - 111 mmol/L 95  97  98   CO2 22 - 32 mmol/L 26  27  24    Calcium 8.9 - 10.3 mg/dL 8.6  8.5  8.3    Intake/Output      03/26 0701 03/27 0700 03/27 0701 03/28 0700   P.O. 440 300   IV Piggyback 50    Total Intake(mL/kg) 490 (4.5) 300 (2.7)   Urine (mL/kg/hr) 1700 (0.6)    Stool 0    Total Output 1700    Net -1210 +300        Urine Occurrence 4 x    Stool Occurrence 5 x       Physical Exam: General: NAD.  Sitting up in bedside chair, calm, comfortable  Resp: No increased wob Cardio: regular rate and rhythm ABD soft Neurologically intact MSK Neurovascularly intact Sensation intact distally Intact pulses distally Dorsiflexion/Plantar flexion intact Can wiggle  toes Calf soft and compressible Incision: dressing C/D/I  I removed all the surgical dressings and saw some bloody/purulent drainage on the gauze. 3 Nylon interrupted sutures are in place holding the wound well approximated. Erythema and edema present around wound. Not as TTP on the lateral and plantar aspect of the wound.  I was able to express a little more purulent drainage from the distal portion of the wound near the proximal phalanx. Devitalized skin seen in center of wound with increased amount of liquefied tissue deep to it. Looks grayish and slightly necrotic but not black. Below are images I took today 03/09/23:        Assessment: 3 Days Post-Op  S/P Procedure(s) (LRB): IRRIGATION AND DEBRIDEMENT OF RIGHT FOOT (Right) by Dr. Ernesta Amble. Murphy on 03/06/23  Principal Problem:   Sepsis ([place]) Active  Problems:   Benign essential HTN   Chronic combined systolic and diastolic heart failure (HCC)   ICD (implantable cardioverter-defibrillator) in place   Hemiparesis affecting left side as late effect of cerebrovascular accident [facility])   Poorly controlled type 2 diabetes mellitus with neuropathy (HCC)   Atrial fibrillation ([place])   Diabetic foot infection ([place])   Cellulitis of right lower extremity   Type 2 diabetes mellitus with hyperglycemia, with long-term current use of insulin (HCC)   Foot abscess, right   Plan: Intra-op specimen showing rare STAPHYLOCOCCUS LUGDUNENSIS  Awaiting susceptibilities Continue IV ABX Will need ID consult  I'm concerned that there is still purulent material in the foot especially near the 5th MTP joint. We will go ahead and take her back to the OR tomorrow on Thursday 03/10/23 for another I&D of the right foot. Will need to be NPO at midnight.    Advance diet Up with therapy Incentive Spirometry Elevate and Apply ice  Weightbearing: WBAT RLE Insicional and dressing care: Reinforce dressings as needed Orthopedic device(s): cast  shoe Showering: Keep dressing dry VTE prophylaxis: Lovenox 40mg  qd , SCDs, ambulation Pain control: continue current regimen Follow - up plan: 1 week in the office  Contact information:  Edmonia Lynch MD, Aggie Moats PA-C  Dispo:  TBD. Will likely need [place] services.      Britt Bottom, PA-C Office [phone] 03/09/2023, 9:14 AM

## 2023-03-09 NOTE — Progress Notes (Signed)
HD#3 Subjective:   Summary: This is a 57 year old female with a past medical history of type 2 diabetes, A-fib on Xarelto, HFrEF who presented for chest pain.  Patient found to have right lower extremity erythema and edema.  Admitted for further evaluation and management of sepsis secondary to right fifth metatarsal osteomyelitis and diabetic foot infection.  Overnight Events: No overnight events  Patient sleeping upon my exam.  She denies much pain.  She denies any fevers or chills.  She denies any chest pain or shortness of breath.  She states she was up and walking yesterday, but felt funny.  She did state feeling slightly lightheaded with walking, but no shortness of breath.  She reports good bowel movements.   Objective:  Vital signs in last 24 hours: Vitals:   03/08/23 1514 03/08/23 2110 03/09/23 0515 03/09/23 0827  BP: 125/75 134/76 139/74 123/64  Pulse: 84 76 69 79  Resp: 18 18 18 18   Temp: 98.9 F (37.2 C) 99.3 F (37.4 C) 98.1 F (36.7 C) 98 F (36.7 C)  TempSrc: Oral Oral Oral Oral  SpO2: 97% 97% 96% 96%  Weight:      Height:       Supplemental O2: Room Air SpO2: 100 %  Physical Exam:  Constitutional: Resting in bed, no acute distress HENT: Normocephalic, atraumatic Eyes: conjunctiva non-erythematous Cardiovascular: regular rate and rhythm, no m/r/g Pulmonary/Chest: normal work of breathing on room air, lungs clear to auscultation bilaterally Abdominal: soft, non-tender, non-distended Extremities: Right lower extremity with Ace bandage wrap.  Bilateral symmetric lower extremities.  Patient with equal bilaterally.   Filed Weights   03/05/23 1437 03/06/23 0432 03/06/23 0823  Weight: 99.8 kg 109.1 kg 109.1 kg     Intake/Output Summary (Last 24 hours) at 03/09/2023 1102 Last data filed at 03/09/2023 0900 Gross per 24 hour  Intake 520 ml  Output 1700 ml  Net -1180 ml   Net IO Since Admission: -187.1 mL [03/09/23 1102]  Pertinent Labs:    Latest Ref  Rng & Units 03/09/2023   12:50 AM 03/08/2023    4:05 AM 03/07/2023    6:27 AM  CBC  WBC 4.0 - 10.5 K/uL 11.3  11.3  14.3   Hemoglobin 12.0 - 15.0 g/dL 9.0  9.4  8.9   Hematocrit 36.0 - 46.0 % 28.1  29.3  27.4   Platelets 150 - 400 K/uL 231  215  194        Latest Ref Rng & Units 03/09/2023   12:50 AM 03/08/2023    4:05 AM 03/07/2023    6:27 AM  CMP  Glucose 70 - 99 mg/dL 272  124  218   BUN 6 - 20 mg/dL 5  9  9    Creatinine 0.44 - 1.00 mg/dL 0.88  0.97  1.04   Sodium 135 - 145 mmol/L 132  133  132   Potassium 3.5 - 5.1 mmol/L 3.6  3.6  3.6   Chloride 98 - 111 mmol/L 95  97  98   CO2 22 - 32 mmol/L 26  27  24    Calcium 8.9 - 10.3 mg/dL 8.6  8.5  8.3     Imaging: VAS [place] ABI WITH/WO TBI  Result Date: 03/08/2023  LOWER EXTREMITY DOPPLER STUDY Patient Name:  [name]  Date of Exam:   03/08/2023 Medical Rec #: [id number]         Accession #:    [id number] Date of Birth: 1966-08-31  Patient Gender: F Patient Age:   35 years Exam Location[facility]  [facility] Procedure:      VAS [place] ABI WITH/WO TBI Referring Phys: [facility] GAWNE --------------------------------------------------------------------------------  High Risk Factors: Hypertension, hyperlipidemia, Diabetes, prior CVA.  Comparison Study: No prior study. Performing Technologist: McKayla Maag RVT, [place]  Examination Guidelines: A complete evaluation includes at minimum, Doppler waveform signals and systolic blood pressure reading at the level of bilateral brachial, anterior tibial, and posterior tibial arteries, when vessel segments are accessible. Bilateral testing is considered an integral part of a complete examination. Photoelectric Plethysmograph (PPG) waveforms and toe systolic pressure readings are included as required and additional duplex testing as needed. Limited examinations for reoccurring indications may be performed as noted.  ABI Findings: +---------+------------------+-----+---------+--------+ Right    Rt Pressure  (mmHg)IndexWaveform Comment  +---------+------------------+-----+---------+--------+ Brachial 141                    triphasic         +---------+------------------+-----+---------+--------+ PTA      143               1.01 triphasic         +---------+------------------+-----+---------+--------+ DP       140               0.99 triphasic         +---------+------------------+-----+---------+--------+ Great Toe125               0.89 Normal            +---------+------------------+-----+---------+--------+ +---------+------------------+-----+---------+-------+ Left     Lt Pressure (mmHg)IndexWaveform Comment +---------+------------------+-----+---------+-------+ Brachial 133                    triphasic        +---------+------------------+-----+---------+-------+ PTA      171               1.21 triphasic        +---------+------------------+-----+---------+-------+ DP       177               1.26 triphasic        +---------+------------------+-----+---------+-------+ Great Toe165               1.17 Normal           +---------+------------------+-----+---------+-------+ +-------+-----------+-----------+------------+------------+ ABI/TBIToday's ABIToday's TBIPrevious ABIPrevious TBI +-------+-----------+-----------+------------+------------+ Right  1.01       0.89                                +-------+-----------+-----------+------------+------------+ Left   1.26       1.17                                +-------+-----------+-----------+------------+------------+  Summary: Right: Resting right ankle-brachial index is within normal range. The right toe-brachial index is normal. Left: Resting left ankle-brachial index is within normal range. The left toe-brachial index is normal. *See table(s) above for measurements and observations.  Electronically signed by Harold Barban MD on 03/08/2023 at 7:19:23 PM.    Final     Assessment/Plan:    Principal Problem:   Sepsis [facility]) Active Problems:   Benign essential HTN   Chronic combined systolic and diastolic heart failure ([place])   ICD (implantable cardioverter-defibrillator) in place   Hemiparesis affecting left side as late effect of cerebrovascular accident ([place])  Poorly controlled type 2 diabetes mellitus with neuropathy (HCC)   Atrial fibrillation (HCC)   Diabetic foot infection ([place])   Cellulitis of right lower extremity   Type 2 diabetes mellitus with hyperglycemia, with long-term current use of insulin (HCC)   Foot abscess, right   Patient Summary: [name] is a 57 y.o. person living with a history of type 2 diabetes, HFrEF, atrial fibrillation who presents for chest pain.  Patient found to have right foot erythema and swelling concerning for cellulitis.  Patient was SIRS positive with likely source of infection from foot wound.  Patient admitted for further evaluation and management of sepsis likely secondary to diabetic foot wound of right foot.   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  She states that her pain is better.  On exam, patient is improving on the lower extremity.  Patient has good sensation.  Erythema has resolved.  Right lower extremity wrapped in Ace bandage.  ABIs did not show any significant arterial disease. Cultures growing Staphylococcus Lugdunensis. Will await sensitivities.  Patient remains afebrile.  White count stable.  Ortho concerned that patient may need further washout.  Will await further Ortho recommendations.  Will continue with Zosyn and linezolid until sensitivities return.  After sensitivities return, will likely consult ID for further long-term management. -Zosyn day 5 -Linezolid day 5 -Multimodal pain control with Tylenol and oxycodone -Continue to follow cultures for sensitivities -Ortho following, recommending further  washout likely tomorrow  -Monitor white count and fever curve -Will need OPAT orders placed once sensitivities return   #Type II Diabetes Mellitus  Patient remains on 30 units long-acting insulin.  Fasting glucose this morning is 167.  Glucose ranging between 122-288. Patient will be n.p.o. at midnight, and likely will need further titration of insulin. -Continue 30 units of insulin -Increase sliding scale to resistant  -Continue to monitor CBGs -Goal blood glucose level less than 180  #CAD #HFrEF No concern for acute exacerbation at this time.  Patient remains hemodynamically stable.  Continue home medications except for hydralazine 25 mg 3 times daily. -Continue home metoprolol succinate 100 mg daily -Continue home Imdur 90 mg daily -Continue home spironolactone 100 mg daily -Hold home hydralazine 25 mg 3 times daily -Continue home Lipitor 40 mg daily -Continue Lasix 40 mg Monday and Thursdays   #Atrial fibrillation Patient remains rate controlled.  Given patient will need procedure, we will hold on resuming Xarelto.  Will continue with metoprolol 100 mg daily.   -Continue metoprolol succinate 100 mg daily -Hold Xarelto 20 mg daily -Continue telemetry   #Hypertension Blood pressure remains normotensive currently. -Continue home amlodipine, Imdur, metoprolol succinate, and spironolactone.   -Continue with Lasix Monday and Thursday -Hold hydralazine 25 mg 3 times daily  #Anxiety and depression No acute concerns -Continue home Lexapro 10 mg daily -Continue home BuSpar 7.5 mg twice daily   #Seizure disorder No acute concerns, patient remains seizure-free -Continue Keppra 500 mg twice daily   Diet: Carb modified, NPO at midnight  IVF: None,None VTE: DOAC Code: Full PT/OT recs: Home Health   Dispo: Anticipated discharge to Home in 3 days pending clinical improvement.   [place] [facility] Resident PGY-1 [phone] Please contact the on call pager after 5 pm  and on weekends at [phone].

## 2023-03-09 NOTE — Telephone Encounter (Signed)
Patient Advocate Encounter  Prior Authorization for [employer] 3 Sensor has been approved.    PA# [id number] Confirmation# [id number] W Insurance [place] Medicaid Effective dates: 03/09/2023 through 09/05/2023  Patients co-pay is $0.00.     Lyndel Safe, [place] Patient Advocate Specialist [place] Patient Advocate Team Direct Number: [phone]  Fax: [phone]

## 2023-03-09 NOTE — Inpatient Diabetes Management (Signed)
Inpatient Diabetes Program Recommendations  AACE/ADA: New Consensus Statement on Inpatient Glycemic Control (2015)  Target Ranges:  Prepandial:   less than 140 mg/dL      Peak postprandial:   less than 180 mg/dL (1-2 hours)      Critically ill patients:  140 - 180 mg/dL   Lab Results  Component Value Date   GLUCAP 237 (H) 03/09/2023   HGBA1C 10.4 (H) 03/06/2023    Review of Glycemic Control  Latest Reference Range & Units 03/08/23 07:27 03/08/23 11:07 03/08/23 15:50 03/08/23 21:11 03/09/23 06:25 03/09/23 07:22 03/09/23 11:16  Glucose-Capillary 70 - 99 mg/dL 122 (H) 232 (H) 174 (H) 288 (H) 183 (H) 167 (H) 237 (H)   Diabetes history: DM 2 Outpatient Diabetes medications: Lantus 25 units, Trulicity weekly Current orders for Inpatient glycemic control:  Levemir 30 units Novolog 0-20 units tid  A1c 10.4% on 3/24  Spoke with pt at bedside regarding A1c of 10.4% and glucose control at home. Pt reports taking medication prescribed at home consistently. However, due to hemiparesis, pt has not checked glucose in 2 months. Pt follows with her PCP every 1-2 months. Last A1c was 13% at last PCP appt. She has difficulty sticking her fingers. Pt has been wanting to get a CGM and has asked her doctor about it outpatient. She has not heard back from that. Reviewed glucose and A1c goals. Discussed importance of glucose control outpatient. No other needs identified at this time.   D/c: -  pt will need CGM at time of d/c for ease of glucose checks.   Benefits check CGM: Dexcom G7 and Freestyle Libre 3 Needs prior authorization, we can complete this while pt is here.   Thanks,  Tama Headings RN, MSN, BC-ADM Inpatient Diabetes Coordinator Team Pager [phone] (8a-5p)

## 2023-03-09 NOTE — Progress Notes (Signed)
Occupational Therapy Treatment Patient Details Name: [name] MRN: [id number] DOB: [birth year] Today's Date: 03/09/2023   History of present illness Patient is a 57 y/o female who presents on 3/23 with SOB, CP and right foot pain/swelling. s/p I&D in ED and s/p I&D right foot in OR on 3/24. Found to have sepsis secondary to diabetic wound of right foot. PMH includes CVA with left hemiparesis, ICD, CHF, A-fib, DM2, CAD, obesity, OSA on CPAP, seizures.   OT comments  Pt seen for OT ADL Retraining session with focus on grooming sitting up at EOB with set up assist; LB dressing with max assist to don sock on LLE and post op shoe on RLE; toilet transfer and peri-care after using 3:1 & hemi walker with Min-mod assist. Pt bed linens were wet and soiled therefore linens were changed while pt was using 3:1. RN gave pt meds and changed dressing PRN. Pt was assisted back to bed at conclusion of session with needs met.   Recommendations for follow up therapy are one component of a multi-disciplinary discharge planning process, led by the attending physician.  Recommendations may be updated based on patient status, additional functional criteria and insurance authorization.    Assistance Recommended at Discharge Frequent or constant Supervision/Assistance  Patient can return home with the following  A lot of help with walking and/or transfers;A lot of help with bathing/dressing/bathroom;Assistance with cooking/housework;Direct supervision/assist for medications management;Direct supervision/assist for financial management;Assist for transportation;Help with stairs or ramp for entrance   Equipment Recommendations  Other (comment);None recommended by OT (Pt has necessary DME)    Recommendations for Other Services      Precautions / Restrictions Precautions Precautions: Fall Precaution Comments: left hemiparesis from CVA 9 years ago per pt report Required Braces or Orthoses: Other Brace Other Brace:  post op shoe on right Restrictions Weight Bearing Restrictions: No RLE Weight Bearing: Weight bearing as tolerated       Mobility Bed Mobility Overal bed mobility: Needs Assistance Bed Mobility: Supine to Sit, Sit to Supine     Supine to sit: Min assist, HOB elevated, Mod assist Sit to supine: Min assist, Mod assist   General bed mobility comments: Mod A for LLE, increased time, HOB elevated    Transfers Overall transfer level: Needs assistance Equipment used: Hemi-walker Transfers: Sit to/from Stand, Bed to chair/wheelchair/BSC Sit to Stand: Min assist, From elevated surface   Step pivot transfers: Min assist, +2 physical assistance, +2 safety/equipment     Balance Overall balance assessment: Needs assistance Sitting-balance support: Feet supported, No upper extremity supported Sitting balance-Leahy Scale: Fair Sitting balance - Comments: VC's to scoot to EOB and place feet on floor. supervision for safety.   Standing balance support: During functional activity, Single extremity supported Standing balance-Leahy Scale: Poor Standing balance comment: Requires unilateral UE support and external support with hemi walker     ADL either performed or assessed with clinical judgement   ADL Overall ADL's : Needs assistance/impaired     Grooming: Wash/dry hands;Wash/dry face;Oral care;Set up;Sitting Grooming Details (indicate cue type and reason): Sitting up at EOB Upper Body Bathing: Set up;Minimal assistance;Sitting;Moderate assistance   Lower Body Bathing: Minimal assistance;Moderate assistance;Sit to/from stand;Sitting/lateral leans Lower Body Bathing Details (indicate cue type and reason): Using hemi walker   Lower Body Dressing: Maximal assistance;Sitting/lateral leans;Sit to/from stand Lower Body Dressing Details (indicate cue type and reason): Don sock LLE and post op shoe RLE Toilet Transfer: Minimal assistance;Stand-pivot;BSC/3in1 Armed forces technical officer Details  (indicate cue type and reason): Using  Hemi walker Toileting- Clothing Manipulation and Hygiene: Minimal assistance;Sitting/lateral lean;Sit to/from stand Toileting - Clothing Manipulation Details (indicate cue type and reason): After using 3:1   Functional mobility during ADLs: Minimal assistance (Hemi walker) General ADL Comments: Pt seen for OT ADL Retraining session with focus on grooming sitting up at EOB with set up assist; LB dressing with max assist to don sock on LLE and post op shoe on RLE; toilet transfer and peri-care after using 3:1 & hemi walker with Min assist. Pt bed linens were wet and soiled therefore linens were changed while pt was using 3:1. RN gave pt meds and changed dressing PRN. Pt was assisted back to bed at conclusion of session with needs met.    Extremity/Trunk Assessment Upper Extremity Assessment Upper Extremity Assessment: Defer to OT evaluation;LUE deficits/detail LUE Deficits / Details: no active movement noted from prior CVA, pt reports she has a splint she typically wears at night time on LUE but did not bring to hospital LUE Sensation: decreased light touch LUE Coordination: decreased fine motor;decreased gross motor   Lower Extremity Assessment Lower Extremity Assessment: Defer to PT evaluation   Cervical / Trunk Assessment Cervical / Trunk Assessment: Normal    Vision Baseline Vision/History: 1 Wears glasses Ability to See in Adequate Light: 0 Adequate Patient Visual Report: No change from baseline Vision Assessment?: No apparent visual deficits          Cognition Arousal/Alertness: Awake/alert Behavior During Therapy: WFL for tasks assessed/performed Overall Cognitive Status: Within Functional Limits for tasks assessed                   Pertinent Vitals/ Pain       Pain Assessment Pain Assessment: Faces Faces Pain Scale: Hurts little more Pain Location: right foot Pain Descriptors / Indicators: Aching, Sore Pain Intervention(s):  Monitored during session, Limited activity within patient's tolerance, Repositioned, RN gave pain meds during session  Home Living Family/patient expects to be discharged to:: Private residence Living Arrangements: Non-relatives/Friends (roommate) Available Help at Discharge: Friend(s);Available PRN/intermittently;Personal care attendant Type of Home: House Home Access: Ramped entrance Entrance Stairs-Number of Steps: 2 Entrance Stairs-Rails: Left;Right Home Layout: One level   Bathroom Shower/Tub: Teacher, early years/pre: Standard     Home Equipment: Shower seat;BSC/3in1;Toilet riser;Cane - quad;Wheelchair - manual;Wheelchair - power   Additional Comments: Aide comes 7 days/week for 6 hours a day. Roommate works during the day different hours.      Prior Functioning/Environment   Has PCA ~6 hours a day for assist with ADL and IADL's. Please see initial OT eval for details   Frequency  Min 2X/week        Progress Toward Goals  OT Goals(current goals can now be found in the care plan section)  Progress towards OT goals: Progressing toward goals  Acute Rehab OT Goals Patient Stated Goal: Go home and sleep until I want to OT Goal Formulation: With patient Time For Goal Achievement: 03/21/23 Potential to Achieve Goals: Good  Plan Discharge plan remains appropriate       AM-PAC OT "6 Clicks" Daily Activity     Outcome Measure   Help from another person eating meals?: A Little Help from another person taking care of personal grooming?: A Little Help from another person toileting, which includes using toliet, bedpan, or urinal?: A Lot Help from another person bathing (including washing, rinsing, drying)?: A Lot Help from another person to put on and taking off regular upper body clothing?: A Little  Help from another person to put on and taking off regular lower body clothing?: A Lot 6 Click Score: 15    End of Session Equipment Utilized During Treatment:  Gait belt;Other (comment) (Hemi walker)  OT Visit Diagnosis: Unsteadiness on feet (R26.81);Other abnormalities of gait and mobility (R26.89);Muscle weakness (generalized) (M62.81)   Activity Tolerance Patient tolerated treatment well   Patient Left in bed;with call bell/phone within reach;with bed alarm set;with nursing/sitter in room   Nurse Communication Mobility status;Other (comment) (bed soiled and linens changed)        Time: 1000-1038 OT Time Calculation (min): 38 min  Charges: OT General Charges $OT Visit: 1 Visit OT Treatments $Self Care/Home Management : 38-52 mins   Ayauna Mcnay, [place], OTR/L 03/09/2023, 10:56 AM

## 2023-03-10 ENCOUNTER — Other Ambulatory Visit: Payer: Self-pay

## 2023-03-10 ENCOUNTER — Encounter (HOSPITAL_COMMUNITY)
Admission: EM | Disposition: A | Discharge status: Home Health | Payer: Self-pay | Source: Home / Self Care | Attending: Internal Medicine

## 2023-03-10 ENCOUNTER — Encounter (HOSPITAL_COMMUNITY): Payer: Self-pay | Admitting: Internal Medicine

## 2023-03-10 ENCOUNTER — Inpatient Hospital Stay (HOSPITAL_COMMUNITY): Payer: Medicaid Other | Admitting: Anesthesiology

## 2023-03-10 DIAGNOSIS — L02611 Cutaneous abscess of right foot: Secondary | ICD-10-CM | POA: Diagnosis not present

## 2023-03-10 DIAGNOSIS — L03115 Cellulitis of right lower limb: Secondary | ICD-10-CM | POA: Diagnosis not present

## 2023-03-10 DIAGNOSIS — I509 Heart failure, unspecified: Secondary | ICD-10-CM

## 2023-03-10 DIAGNOSIS — I110 Hypertensive heart disease with heart failure: Secondary | ICD-10-CM

## 2023-03-10 DIAGNOSIS — L089 Local infection of the skin and subcutaneous tissue, unspecified: Secondary | ICD-10-CM

## 2023-03-10 DIAGNOSIS — Z794 Long term (current) use of insulin: Secondary | ICD-10-CM

## 2023-03-10 DIAGNOSIS — I2510 Atherosclerotic heart disease of native coronary artery without angina pectoris: Secondary | ICD-10-CM

## 2023-03-10 DIAGNOSIS — Z7984 Long term (current) use of oral hypoglycemic drugs: Secondary | ICD-10-CM

## 2023-03-10 DIAGNOSIS — E119 Type 2 diabetes mellitus without complications: Secondary | ICD-10-CM

## 2023-03-10 DIAGNOSIS — M86171 Other acute osteomyelitis, right ankle and foot: Secondary | ICD-10-CM | POA: Diagnosis not present

## 2023-03-10 DIAGNOSIS — A419 Sepsis, unspecified organism: Secondary | ICD-10-CM | POA: Diagnosis not present

## 2023-03-10 HISTORY — PX: I & D EXTREMITY: SHX5045

## 2023-03-10 LAB — BASIC METABOLIC PANEL
Anion gap: 14 (ref 5–15)
BUN: 5 mg/dL — ABNORMAL LOW (ref 6–20)
CO2: 27 mmol/L (ref 22–32)
Calcium: 9 mg/dL (ref 8.9–10.3)
Chloride: 94 mmol/L — ABNORMAL LOW (ref 98–111)
Creatinine, Ser: 0.81 mg/dL (ref 0.44–1.00)
GFR, Estimated: >60 mL/min (ref >60)
Glucose, Bld: 174 mg/dL — ABNORMAL HIGH (ref 70–99)
Potassium: 3.4 mmol/L — ABNORMAL LOW (ref 3.5–5.1)
Sodium: 135 mmol/L (ref 135–145)

## 2023-03-10 LAB — CULTURE, BLOOD (ROUTINE X 2)
Culture: NO GROWTH
Culture: NO GROWTH
Special Requests: ADEQUATE

## 2023-03-10 LAB — GLUCOSE, CAPILLARY
Glucose-Capillary: 118 mg/dL — ABNORMAL HIGH (ref 70–99)
Glucose-Capillary: 120 mg/dL — ABNORMAL HIGH (ref 70–99)
Glucose-Capillary: 122 mg/dL — ABNORMAL HIGH (ref 70–99)
Glucose-Capillary: 161 mg/dL — ABNORMAL HIGH (ref 70–99)
Glucose-Capillary: 344 mg/dL — ABNORMAL HIGH (ref 70–99)
Glucose-Capillary: 439 mg/dL — ABNORMAL HIGH (ref 70–99)

## 2023-03-10 LAB — CBC
HCT: 29.2 % — ABNORMAL LOW (ref 36.0–46.0)
Hemoglobin: 9.1 g/dL — ABNORMAL LOW (ref 12.0–15.0)
MCH: 26.1 pg (ref 26.0–34.0)
MCHC: 31.2 g/dL (ref 30.0–36.0)
MCV: 83.9 fL (ref 80.0–100.0)
Platelets: 252 10*3/uL (ref 150–400)
RBC: 3.48 MIL/uL — ABNORMAL LOW (ref 3.87–5.11)
RDW: 12.4 % (ref 11.5–15.5)
WBC: 10.6 10*3/uL — ABNORMAL HIGH (ref 4.0–10.5)
nRBC: 0 % (ref 0.0–0.2)

## 2023-03-10 SURGERY — IRRIGATION AND DEBRIDEMENT EXTREMITY
Anesthesia: General | Site: Foot | Laterality: Right

## 2023-03-10 MED ORDER — SODIUM CHLORIDE 0.9 % IR SOLN
Status: DC | PRN
  Administered 2023-03-10: 3000 mL

## 2023-03-10 MED ORDER — FENTANYL CITRATE (PF) 250 MCG/5ML IJ SOLN
INTRAMUSCULAR | Status: AC
  Filled 2023-03-10: qty 5

## 2023-03-10 MED ORDER — OXYCODONE HCL 5 MG PO TABS: 5.0000 mg | ORAL_TABLET | Freq: Once | ORAL | Status: DC | PRN

## 2023-03-10 MED ORDER — LACTATED RINGERS IV SOLN: INTRAVENOUS | Status: DC | PRN

## 2023-03-10 MED ORDER — DOCUSATE SODIUM 100 MG PO CAPS
100.0000 mg | ORAL_CAPSULE | Freq: Two times a day (BID) | ORAL | Status: DC
  Administered 2023-03-10: 100 mg via ORAL
  Filled 2023-03-10: qty 1

## 2023-03-10 MED ORDER — OXYCODONE HCL 5 MG/5ML PO SOLN: 5.0000 mg | Freq: Once | ORAL | Status: DC | PRN

## 2023-03-10 MED ORDER — CHLORHEXIDINE GLUCONATE 0.12 % MT SOLN: 15.0000 mL | Freq: Once | OROMUCOSAL | Status: AC

## 2023-03-10 MED ORDER — ONDANSETRON HCL 4 MG/2ML IJ SOLN
INTRAMUSCULAR | Status: DC | PRN
  Administered 2023-03-10: 4 mg via INTRAVENOUS

## 2023-03-10 MED ORDER — PIPERACILLIN-TAZOBACTAM 3.375 G IVPB
3.3750 g | Freq: Three times a day (TID) | INTRAVENOUS | Status: DC
  Administered 2023-03-10 – 2023-03-11 (×4): 3.375 g via INTRAVENOUS
  Filled 2023-03-10 (×4): qty 50

## 2023-03-10 MED ORDER — FENTANYL CITRATE (PF) 100 MCG/2ML IJ SOLN: 25.0000 ug | INTRAMUSCULAR | Status: DC | PRN

## 2023-03-10 MED ORDER — FENTANYL CITRATE (PF) 250 MCG/5ML IJ SOLN
INTRAMUSCULAR | Status: DC | PRN
  Administered 2023-03-10: 50 ug via INTRAVENOUS
  Administered 2023-03-10: 150 ug via INTRAVENOUS

## 2023-03-10 MED ORDER — ONDANSETRON HCL 4 MG/2ML IJ SOLN: 4.0000 mg | Freq: Once | INTRAMUSCULAR | Status: DC | PRN

## 2023-03-10 MED ORDER — ACETAMINOPHEN 325 MG PO TABS: 325.0000 mg | ORAL_TABLET | ORAL | Status: DC | PRN

## 2023-03-10 MED ORDER — MIDAZOLAM HCL 2 MG/2ML IJ SOLN
INTRAMUSCULAR | Status: AC
  Filled 2023-03-10: qty 2

## 2023-03-10 MED ORDER — DEXAMETHASONE SODIUM PHOSPHATE 10 MG/ML IJ SOLN
INTRAMUSCULAR | Status: AC
  Filled 2023-03-10: qty 2

## 2023-03-10 MED ORDER — ONDANSETRON HCL 4 MG/2ML IJ SOLN
INTRAMUSCULAR | Status: AC
  Filled 2023-03-10: qty 4

## 2023-03-10 MED ORDER — ORAL CARE MOUTH RINSE: 15.0000 mL | Freq: Once | OROMUCOSAL | Status: AC

## 2023-03-10 MED ORDER — LIDOCAINE 2% (20 MG/ML) 5 ML SYRINGE
INTRAMUSCULAR | Status: AC
  Filled 2023-03-10: qty 5

## 2023-03-10 MED ORDER — PHENYLEPHRINE HCL-NACL 20-0.9 MG/250ML-% IV SOLN
INTRAVENOUS | Status: DC | PRN
  Administered 2023-03-10: 40 ug/min via INTRAVENOUS

## 2023-03-10 MED ORDER — ACETAMINOPHEN 160 MG/5ML PO SOLN: 325.0000 mg | ORAL | Status: DC | PRN

## 2023-03-10 MED ORDER — METOCLOPRAMIDE HCL 5 MG/ML IJ SOLN: 5.0000 mg | Freq: Three times a day (TID) | INTRAMUSCULAR | Status: DC | PRN

## 2023-03-10 MED ORDER — INSULIN ASPART 100 UNIT/ML IJ SOLN
5.0000 [IU] | Freq: Three times a day (TID) | INTRAMUSCULAR | Status: DC
  Administered 2023-03-11 (×2): 5 [IU] via SUBCUTANEOUS

## 2023-03-10 MED ORDER — VANCOMYCIN HCL 1000 MG IV SOLR
INTRAVENOUS | Status: AC
  Filled 2023-03-10: qty 20

## 2023-03-10 MED ORDER — PROPOFOL 10 MG/ML IV BOLUS
INTRAVENOUS | Status: DC | PRN
  Administered 2023-03-10: 170 mg via INTRAVENOUS

## 2023-03-10 MED ORDER — INSULIN ASPART 100 UNIT/ML IJ SOLN
5.0000 [IU] | Freq: Once | INTRAMUSCULAR | Status: AC
  Administered 2023-03-10: 5 [IU] via SUBCUTANEOUS

## 2023-03-10 MED ORDER — POTASSIUM CHLORIDE CRYS ER 20 MEQ PO TBCR
40.0000 meq | EXTENDED_RELEASE_TABLET | Freq: Once | ORAL | Status: AC
  Administered 2023-03-10: 40 meq via ORAL
  Filled 2023-03-10: qty 2

## 2023-03-10 MED ORDER — CHLORHEXIDINE GLUCONATE 0.12 % MT SOLN
OROMUCOSAL | Status: AC
  Administered 2023-03-10: 15 mL via OROMUCOSAL
  Filled 2023-03-10: qty 15

## 2023-03-10 MED ORDER — EPHEDRINE SULFATE-NACL 50-0.9 MG/10ML-% IV SOSY
PREFILLED_SYRINGE | INTRAVENOUS | Status: DC | PRN
  Administered 2023-03-10: 15 mg via INTRAVENOUS

## 2023-03-10 MED ORDER — LIDOCAINE 2% (20 MG/ML) 5 ML SYRINGE
INTRAMUSCULAR | Status: DC | PRN
  Administered 2023-03-10: 100 mg via INTRAVENOUS

## 2023-03-10 MED ORDER — ONDANSETRON HCL 4 MG PO TABS: 4.0000 mg | ORAL_TABLET | Freq: Four times a day (QID) | ORAL | Status: DC | PRN

## 2023-03-10 MED ORDER — MEPERIDINE HCL 25 MG/ML IJ SOLN: 6.2500 mg | INTRAMUSCULAR | Status: DC | PRN

## 2023-03-10 MED ORDER — LACTATED RINGERS IV SOLN: INTRAVENOUS | Status: DC

## 2023-03-10 MED ORDER — DEXAMETHASONE SODIUM PHOSPHATE 10 MG/ML IJ SOLN
INTRAMUSCULAR | Status: DC | PRN
  Administered 2023-03-10: 5 mg via INTRAVENOUS

## 2023-03-10 MED ORDER — ONDANSETRON HCL 4 MG/2ML IJ SOLN: 4.0000 mg | Freq: Four times a day (QID) | INTRAMUSCULAR | Status: DC | PRN

## 2023-03-10 MED ORDER — VANCOMYCIN HCL 1 G IV SOLR
INTRAVENOUS | Status: DC | PRN
  Administered 2023-03-10: 1000 mg via TOPICAL

## 2023-03-10 MED ORDER — METOCLOPRAMIDE HCL 5 MG PO TABS: 5.0000 mg | ORAL_TABLET | Freq: Three times a day (TID) | ORAL | Status: DC | PRN

## 2023-03-10 SURGICAL SUPPLY — 64 items
BAG COUNTER SPONGE SURGICOUNT (BAG) ×1 IMPLANT
BAG SPNG CNTER NS LX DISP (BAG)
BANDAGE ESMARK 6X9 LF (GAUZE/BANDAGES/DRESSINGS) IMPLANT
BLADE SURG 10 STRL SS (BLADE) ×1 IMPLANT
BNDG CMPR 9X4 STRL LF SNTH (GAUZE/BANDAGES/DRESSINGS)
BNDG CMPR 9X6 STRL LF SNTH (GAUZE/BANDAGES/DRESSINGS)
BNDG COHESIVE 4X5 TAN STRL (GAUZE/BANDAGES/DRESSINGS) ×1 IMPLANT
BNDG ELASTIC 4X5.8 VLCR STR LF (GAUZE/BANDAGES/DRESSINGS) ×1 IMPLANT
BNDG ELASTIC 6X5.8 VLCR STR LF (GAUZE/BANDAGES/DRESSINGS) ×1 IMPLANT
BNDG ESMARK 4X9 LF (GAUZE/BANDAGES/DRESSINGS) IMPLANT
BNDG ESMARK 6X9 LF (GAUZE/BANDAGES/DRESSINGS)
BNDG GAUZE DERMACEA FLUFF 4 (GAUZE/BANDAGES/DRESSINGS) ×1 IMPLANT
BNDG GZE DERMACEA 4 6PLY (GAUZE/BANDAGES/DRESSINGS) ×1
CNTNR URN SCR LID CUP LEK RST (MISCELLANEOUS) IMPLANT
CONT SPEC 4OZ STRL OR WHT (MISCELLANEOUS)
COVER SURGICAL LIGHT HANDLE (MISCELLANEOUS) ×1 IMPLANT
CUFF TOURN SGL LL 12 NO SLV (MISCELLANEOUS) IMPLANT
CUFF TOURN SGL QUICK 34 (TOURNIQUET CUFF)
CUFF TRNQT CYL 34X4.125X (TOURNIQUET CUFF) IMPLANT
DRAIN PENROSE 12X.25 LTX STRL (MISCELLANEOUS) IMPLANT
DRAPE SURG 17X23 STRL (DRAPES) IMPLANT
DRAPE U-SHAPE 47X51 STRL (DRAPES) IMPLANT
DRSG EMULSION OIL 3X3 NADH (GAUZE/BANDAGES/DRESSINGS) IMPLANT
DURAPREP 26ML APPLICATOR (WOUND CARE) ×1 IMPLANT
ELECT REM PT RETURN 9FT ADLT (ELECTROSURGICAL) ×1
ELECTRODE REM PT RTRN 9FT ADLT (ELECTROSURGICAL) IMPLANT
EVACUATOR 1/8 PVC DRAIN (DRAIN) IMPLANT
FACESHIELD WRAPAROUND (MASK) IMPLANT
FACESHIELD WRAPAROUND OR TEAM (MASK) ×1 IMPLANT
GAUZE PAD ABD 8X10 STRL (GAUZE/BANDAGES/DRESSINGS) ×1 IMPLANT
GAUZE SPONGE 4X4 12PLY STRL (GAUZE/BANDAGES/DRESSINGS) ×1 IMPLANT
GAUZE XEROFORM 1X8 LF (GAUZE/BANDAGES/DRESSINGS) ×1 IMPLANT
GLOVE BIO SURGEON STRL SZ7.5 (GLOVE) ×1 IMPLANT
GLOVE BIOGEL PI IND STRL 7.5 (GLOVE) ×1 IMPLANT
GLOVE BIOGEL PI IND STRL 8 (GLOVE) ×1 IMPLANT
GLOVE SURG SYN 7.5  E (GLOVE) ×1
GLOVE SURG SYN 7.5 E (GLOVE) ×1 IMPLANT
GLOVE SURG SYN 7.5 PF PI (GLOVE) ×1 IMPLANT
GOWN STRL REUS W/ TWL LRG LVL3 (GOWN DISPOSABLE) ×2 IMPLANT
GOWN STRL REUS W/ TWL XL LVL3 (GOWN DISPOSABLE) ×2 IMPLANT
GOWN STRL REUS W/TWL LRG LVL3 (GOWN DISPOSABLE) ×1
GOWN STRL REUS W/TWL XL LVL3 (GOWN DISPOSABLE) ×2
HANDPIECE INTERPULSE COAX TIP (DISPOSABLE)
KIT BASIN OR (CUSTOM PROCEDURE TRAY) ×1 IMPLANT
KIT TURNOVER KIT B (KITS) ×1 IMPLANT
MANIFOLD NEPTUNE II (INSTRUMENTS) ×1 IMPLANT
NDL HYPO 25GX1X1/2 BEV (NEEDLE) IMPLANT
NEEDLE HYPO 25GX1X1/2 BEV (NEEDLE) IMPLANT
NS IRRIG 1000ML POUR BTL (IV SOLUTION) ×1 IMPLANT
PACK ORTHO EXTREMITY (CUSTOM PROCEDURE TRAY) ×1 IMPLANT
PAD ARMBOARD 7.5X6 YLW CONV (MISCELLANEOUS) ×2 IMPLANT
SET CYSTO W/LG BORE CLAMP LF (SET/KITS/TRAYS/PACK) IMPLANT
SET HNDPC FAN SPRY TIP SCT (DISPOSABLE) IMPLANT
SPONGE T-LAP 18X18 ~~LOC~~+RFID (SPONGE) IMPLANT
STOCKINETTE IMPERVIOUS 9X36 MD (GAUZE/BANDAGES/DRESSINGS) ×1 IMPLANT
SUT ETHILON 3 0 PS 1 (SUTURE) IMPLANT
SUT PDS AB 2-0 CT1 27 (SUTURE) IMPLANT
SWAB CULTURE ESWAB REG 1ML (MISCELLANEOUS) IMPLANT
SYR CONTROL 10ML LL (SYRINGE) IMPLANT
TOWEL GREEN STERILE (TOWEL DISPOSABLE) ×1 IMPLANT
TOWEL GREEN STERILE FF (TOWEL DISPOSABLE) ×1 IMPLANT
TUBE CONNECTING 12X1/4 (SUCTIONS) ×1 IMPLANT
UNDERPAD 30X36 HEAVY ABSORB (UNDERPADS AND DIAPERS) ×1 IMPLANT
YANKAUER SUCT BULB TIP NO VENT (SUCTIONS) ×1 IMPLANT

## 2023-03-10 NOTE — Interval H&P Note (Signed)
History and Physical Interval Note:  03/10/2023 6:32 AM  [name]  has presented today for surgery, with the diagnosis of Right Foot Infection.  The various methods of treatment have been discussed with the patient and family. After consideration of risks, benefits and other options for treatment, the patient has consented to  Procedure(s): IRRIGATION AND DEBRIDEMENT RIGHT FOOT (Right) as a surgical intervention.  The patient's history has been reviewed, patient examined, no change in status, stable for surgery.  I have reviewed the patient's chart and labs.  Questions were answered to the patient's satisfaction.     Renette Butters

## 2023-03-10 NOTE — Anesthesia Procedure Notes (Signed)
Procedure Name: LMA Insertion Date/Time: 03/10/2023 2:50 PM  Performed by: Minerva Ends, CRNAPre-anesthesia Checklist: Patient identified, Emergency Drugs available, Suction available and Patient being monitored Patient Re-evaluated:Patient Re-evaluated prior to induction Oxygen Delivery Method: Circle system utilized Preoxygenation: Pre-oxygenation with 100% oxygen Induction Type: IV induction Ventilation: Mask ventilation without difficulty LMA: LMA inserted LMA Size: 4.0 Tube type: Oral Number of attempts: 1 Placement Confirmation: positive ETCO2 and breath sounds checked- equal and bilateral Tube secured with: Tape Dental Injury: Teeth and Oropharynx as per pre-operative assessment

## 2023-03-10 NOTE — Progress Notes (Signed)
HD#4 Subjective:   Summary: This is a 57 year old female with a past medical history of type 2 diabetes, A-fib on Xarelto, HFrEF who presented for chest pain.  Patient found to have right lower extremity erythema and edema.  Admitted for further evaluation and management of sepsis secondary to right fifth metatarsal osteomyelitis and diabetic foot infection.  Overnight Events: No overnight events  Patient examined at bedside.  Patient states she is ready to go home.  Patient does report she had some chest pain overnight.  She reported it to be heavy and she became sweaty as well as lightheaded.  She states she thinks it is because she did not have her lidocaine patch on.  She denies any shortness of breath.  She states it lasted about 1 hour.  She denies any radiation of this pain.  She denies any palpitations.  She not require any medications.  She states she also has been having blurry eyesight and states she is ready to get her eyes checked.  She states she was up and walking, and felt good with her boot on.  Objective:  Vital signs in last 24 hours: Vitals:   03/09/23 0827 03/09/23 1558 03/09/23 2015 03/10/23 0443  BP: 123/64 130/76 128/76 125/81  Pulse: 79 74 74 67  Resp: 18 16 18 19   Temp: 98 F (36.7 C) 98.7 F (37.1 C) 98.1 F (36.7 C) 98.1 F (36.7 C)  TempSrc: Oral Oral Oral Oral  SpO2: 96% 98% 97% 94%  Weight:      Height:       Supplemental O2: Room Air SpO2: 100 %  Physical Exam:  Constitutional: Resting in bed, no acute distress HENT: Normocephalic, atraumatic Eyes: conjunctiva non-erythematous Cardiovascular: regular rate and rhythm, no m/r/g Chest: Chest tender Pulmonary/Chest: normal work of breathing on room air, lungs clear to auscultation bilaterally Abdominal: soft, non-tender, non-distended Extremities: Right lower extremity with Ace bandage wrap.  Bilateral symmetric lower extremities.  No edema appreciated.  No bleeding or drainage noted through  bandage.  Filed Weights   03/05/23 1437 03/06/23 0432 03/06/23 0823  Weight: 99.8 kg 109.1 kg 109.1 kg     Intake/Output Summary (Last 24 hours) at 03/10/2023 0704 Last data filed at 03/10/2023 0500 Gross per 24 hour  Intake 1440 ml  Output 450 ml  Net 990 ml    Net IO Since Admission: 502.9 mL [03/10/23 0704]  Pertinent Labs:    Latest Ref Rng & Units 03/10/2023    1:58 AM 03/09/2023   12:50 AM 03/08/2023    4:05 AM  CBC  WBC 4.0 - 10.5 K/uL 10.6  11.3  11.3   Hemoglobin 12.0 - 15.0 g/dL 9.1  9.0  9.4   Hematocrit 36.0 - 46.0 % 29.2  28.1  29.3   Platelets 150 - 400 K/uL 252  231  215        Latest Ref Rng & Units 03/10/2023    1:58 AM 03/09/2023   12:50 AM 03/08/2023    4:05 AM  CMP  Glucose 70 - 99 mg/dL 174  272  124   BUN 6 - 20 mg/dL 5  5  9    Creatinine 0.44 - 1.00 mg/dL 0.81  0.88  0.97   Sodium 135 - 145 mmol/L 135  132  133   Potassium 3.5 - 5.1 mmol/L 3.4  3.6  3.6   Chloride 98 - 111 mmol/L 94  95  97   CO2 22 - 32 mmol/L 27  26  27   Calcium 8.9 - 10.3 mg/dL 9.0  8.6  8.5     Imaging: No results found.  Assessment/Plan:   Principal Problem:   Sepsis ([place]) Active Problems:   Benign essential HTN   Chronic combined systolic and diastolic heart failure (HCC)   ICD (implantable cardioverter-defibrillator) in place   Hemiparesis affecting left side as late effect of cerebrovascular accident [facility])   Poorly controlled type 2 diabetes mellitus with neuropathy ([place])   Atrial fibrillation ([place])   Diabetic foot infection ([place])   Cellulitis of right lower extremity   Type 2 diabetes mellitus with hyperglycemia, with long-term current use of insulin (HCC)   Foot abscess, right   Patient Summary: [name] is a 57 y.o. person living with a history of type 2 diabetes, HFrEF, atrial fibrillation who presents for chest pain.  Patient found to have right foot erythema and swelling concerning for cellulitis.  Patient was SIRS positive with likely source of  infection from foot wound.  Patient admitted for further evaluation and management of sepsis likely secondary to diabetic foot wound of right foot.   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  She states her pain is improving.  On exam, I do not appreciate any streaking or edema.  Patient currently on Zosyn and linezolid day 6.  Anticipate susceptibilities should be back tomorrow.  Per Ortho's note, patient will need to see Dr. Sharol Given on Tuesday, 03/15/2023.  After sensitivities return, will likely consult ID for further long-term management. -Zosyn day 6 -Linezolid day 6 -Multimodal pain control with Tylenol and oxycodone -Continue to follow cultures for sensitivities -Monitor white count and fever curve -Will need OPAT orders placed once sensitivities return   #Type II Diabetes Mellitus  Patient's sugars currently ranging between 167-237.  Given patient is n.p.o. for this procedure, do not want to increase her long-acting.  Fasting was 174 this morning.  Goal is for glucose to be less than 180.  Will continue with 30 units of long-acting insulin and sliding scale.   -Continue 30 units of insulin -Increase sliding scale to resistant  -Continue to monitor CBGs -Goal blood glucose level less than 180  #CAD #HFrEF No concern for acute exacerbation at this time.  Patient remains hemodynamically stable.  Continue home medications except for hydralazine 25 mg 3 times daily. -Continue home metoprolol succinate 100 mg daily -Continue home Imdur 90 mg daily -Continue home spironolactone 100 mg daily -Hold home hydralazine 25 mg 3 times daily -Continue home Lipitor 40 mg daily -Continue Lasix 40 mg Monday and Thursdays   #Atrial fibrillation Patient remains rate controlled.  If patient does not need further procedure, we will plan to restart Xarelto as long as hemoglobin remained stable.  For now  hold Xarelto overnight.  -Continue metoprolol succinate 100 mg daily -Hold Xarelto 20 mg daily -Continue telemetry   #Hypertension Blood pressures ranging well. -Continue home amlodipine, Imdur, metoprolol succinate, and spironolactone.   -Continue with Lasix Monday and Thursday -Hold hydralazine 25 mg 3 times daily  #Anxiety and depression No acute concerns -Continue home Lexapro 10 mg daily -Continue home BuSpar 7.5 mg twice daily   #Seizure disorder No acute concerns, patient remains seizure-free -Continue Keppra 500 mg twice daily   Diet: Carb modified, NPO at midnight  IVF: None,None VTE: Enoxaparin Code: Full PT/OT recs: Home Health   Dispo: Anticipated discharge to Home in 2 days  pending clinical improvement.   [place] Internal Medicine Resident PGY-1 [phone] Please contact the on call pager after 5 pm and on weekends at [phone].

## 2023-03-10 NOTE — Progress Notes (Addendum)
Pharmacy Antibiotic Note  [name] is a 57 y.o. female admitted on 03/05/2023 with  wound infection , found to have R 5th metatarsal osteomyelitis and DFI. Pharmacy has been consulted for Zosyn dosing.  Plan: Continue Zosyn 3.375g IV q8 hours (extended interval infusion) Continue linezolid 600mg  IV q12 hours per MD Further I&D 3/28 F/u final wound cultures and ability to narrow antibiotics   Height: 5' 9.02" (175.3 cm) Weight: 109.1 kg (240 lb 8.4 oz) IBW/kg (Calculated) : 66.24  Temp (24hrs), Avg:98.2 F (36.8 C), Min:98 F (36.7 C), Max:98.7 F (37.1 C)  Recent Labs  Lab 03/05/23 1517 03/05/23 1626 03/05/23 1626 03/06/23 0135 03/07/23 0627 03/08/23 0405 03/09/23 0050 03/10/23 0158  WBC  --  16.9*   < > 14.8* 14.3* 11.3* 11.3* 10.6*  CREATININE 0.84  --   --  0.90 1.04* 0.97 0.88 0.81  LATICACIDVEN 1.4 1.1  --   --   --   --   --   --    < > = values in this interval not displayed.     Estimated Creatinine Clearance: 102.1 mL/min (by C-G formula based on SCr of 0.81 mg/dL).    Allergies  Allergen Reactions   Ace Inhibitors Anaphylaxis and Other (See Comments)    Swelling of the tongue and throat    Cleocin [Clindamycin] Anaphylaxis and Swelling   Enalapril Maleate Anaphylaxis and Other (See Comments)    Swelling of the tongue and throat    Zestril [Lisinopril] Anaphylaxis and Other (See Comments)    Swelling of the tongue and throat    Antimicrobials this admit: Vanc 3/23 x1 Zosyn 3/23 >> Linezolid 3/23 >> Cefazolin preop 3/24  Microbiology data: 3/23 BCx: ngtd 3/24 R foot wound: staph lugdunensis  Thank you for allowing pharmacy to be a part of this patient's care.  Dimple Nanas, PharmD, BCPS 03/10/2023 8:18 AM

## 2023-03-10 NOTE — Anesthesia Postprocedure Evaluation (Signed)
Anesthesia Post Note  Patient: [name]  Procedure(s) Performed: RIGHT FOOT INCISION AND DEBRIDEMENT (Right: Foot)     Patient location during evaluation: PACU Anesthesia Type: General Level of consciousness: awake and alert Pain management: pain level controlled Vital Signs Assessment: post-procedure vital signs reviewed and stable Respiratory status: spontaneous breathing, nonlabored ventilation, respiratory function stable and patient connected to nasal cannula oxygen Cardiovascular status: blood pressure returned to baseline and stable Postop Assessment: no apparent nausea or vomiting Anesthetic complications: no  No notable events documented.  Last Vitals:  Vitals:   03/10/23 1600 03/10/23 1628  BP: 118/71 122/73  Pulse: 78 78  Resp: 10 16  Temp: 37.1 C 36.5 C  SpO2: 93% 96%    Last Pain:  Vitals:   03/10/23 1940  TempSrc:   PainSc: 0-No pain                 Aleia Larocca

## 2023-03-10 NOTE — Progress Notes (Signed)
Mobility Specialist Progress Note   03/10/23 1218  Mobility  Activity Transferred to/from [facility];Transferred from chair to bed  Level of Assistance  Education officer, museum)  Assistive Device Front wheel walker (Hemi Walker*)  Distance Ambulated (ft) 2 ft  RLE [employer] Bearing WBAT (w/ post op)  Activity Response Tolerated well  Mobility Referral Yes  $Mobility charge 1 Mobility   Pt received in bed soiled and needing to be transported for surgery. Assisted RN in performing pericare. Pt able to stand from an elevated surface and pivot to [facility] w/ minA. +2A to stand pt and perform pericare d/t L hemiparesis. Returned back to bed w/o fault and left w/ transport in room.   Holland Falling Mobility Specialist Please contact via SecureChat or  Rehab office at [phone]

## 2023-03-10 NOTE — Op Note (Signed)
03/05/2023 - 03/10/2023  2:32 PM  PATIENT:  [name]    PRE-OPERATIVE DIAGNOSIS:  Right Foot Infection  POST-OPERATIVE DIAGNOSIS:  Same  PROCEDURE:  IRRIGATION AND DEBRIDEMENT RIGHT FOOT  SURGEON:  Renette Butters, MD  ASSISTANT: Aggie Moats, PA-C, he was present and scrubbed throughout the case, critical for completion in a timely fashion, and for retraction, instrumentation, and closure.   ANESTHESIA:   gen  PREOPERATIVE INDICATIONS:  [name] is a  57 y.o. female with a diagnosis of Right Foot Infection who failed conservative measures and elected for surgical management.    The risks benefits and alternatives were discussed with the patient preoperatively including but not limited to the risks of infection, bleeding, nerve injury, cardiopulmonary complications, the need for revision surgery, among others, and the patient was willing to proceed.  OPERATIVE IMPLANTS: none  OPERATIVE FINDINGS: prulent fluid  BLOOD LOSS: min  COMPLICATIONS: none  TOURNIQUET TIME: none  OPERATIVE PROCEDURE:  Patient was identified in the preoperative holding area and site was marked by me She was transported to the operating theater and placed on the table in supine position taking care to pad all bony prominences. After a preincinduction time out anesthesia was induced. The right lower extremity was prepped and draped in normal sterile fashion and a pre-incision timeout was performed. She received ancef for preoperative antibiotics.   I reopened the incision over the lateral distal fifth metatarsal purulent fluid was expressed over the plantar surface I debrided soft tissue and lateral fifth metatarsal bone.  I used a rondure to do this  I did expose a pocket of purulence on the plantar surface and was able to debride here.  I thoroughly irrigated with multiple liters of saline  All devitalized and infected tissue was removed  I placed vancomycin powder and loosely closed  with a Penrose drain.  POST OPERATIVE PLAN: Boot for comfort weightbearing as tolerated at the heel DVT prophylaxis per primary antibiotics per primary plan for outpatient follow-up Tuesday with Dr. Sharol Given.

## 2023-03-10 NOTE — Transfer of Care (Signed)
Immediate Anesthesia Transfer of Care Note  Patient: [name]  Procedure(s) Performed: RIGHT FOOT INCISION AND DEBRIDEMENT (Right: Foot)  Patient Location: PACU  Anesthesia Type:General  Level of Consciousness: sedated  Airway & Oxygen Therapy: Patient Spontanous Breathing and Patient connected to face mask oxygen  Post-op Assessment: Report given to RN and Post -op Vital signs reviewed and stable  Post vital signs: Reviewed and stable  Last Vitals:  Vitals Value Taken Time  BP 120/77 03/10/23 1523  Temp 98   Pulse 81 03/10/23 1527  Resp 11 03/10/23 1527  SpO2 97 % 03/10/23 1527  Vitals shown include unvalidated device data.  Last Pain:  Vitals:   03/10/23 1310  TempSrc:   PainSc: 0-No pain         Complications: No notable events documented.

## 2023-03-10 NOTE — Progress Notes (Signed)
Subjective: Patient reports pain as mild in the foot. Complains of HA. Tolerating diet.  Urinating. Has mobilized OOB some with PT/OT but her previous stroke makes movement difficult. She reports feeling very fatigued but not sleeping well.   Objective:   VITALS:   Vitals:   03/08/23 1514 03/08/23 2110 03/09/23 0515 03/09/23 0827  BP: 125/75 134/76 139/74 123/64  Pulse: 84 76 69 79  Resp: 18 18 18 18   Temp: 98.9 F (37.2 C) 99.3 F (37.4 C) 98.1 F (36.7 C) 98 F (36.7 C)  TempSrc: Oral Oral Oral Oral  SpO2: 97% 97% 96% 96%  Weight:      Height:          Latest Ref Rng & Units 03/09/2023   12:50 AM 03/08/2023    4:05 AM 03/07/2023    6:27 AM  CBC  WBC 4.0 - 10.5 K/uL 11.3  11.3  14.3   Hemoglobin 12.0 - 15.0 g/dL 9.0  9.4  8.9   Hematocrit 36.0 - 46.0 % 28.1  29.3  27.4   Platelets 150 - 400 K/uL 231  215  194       Latest Ref Rng & Units 03/09/2023   12:50 AM 03/08/2023    4:05 AM 03/07/2023    6:27 AM  BMP  Glucose 70 - 99 mg/dL 272  124  218   BUN 6 - 20 mg/dL 5  9  9    Creatinine 0.44 - 1.00 mg/dL 0.88  0.97  1.04   Sodium 135 - 145 mmol/L 132  133  132   Potassium 3.5 - 5.1 mmol/L 3.6  3.6  3.6   Chloride 98 - 111 mmol/L 95  97  98   CO2 22 - 32 mmol/L 26  27  24    Calcium 8.9 - 10.3 mg/dL 8.6  8.5  8.3    Intake/Output      03/26 0701 03/27 0700 03/27 0701 03/28 0700   P.O. 440 300   IV Piggyback 50    Total Intake(mL/kg) 490 (4.5) 300 (2.7)   Urine (mL/kg/hr) 1700 (0.6)    Stool 0    Total Output 1700    Net -1210 +300        Urine Occurrence 4 x    Stool Occurrence 5 x       Physical Exam: General: NAD.   Resp: No increased wob Cardio: regular rate and rhythm ABD soft Neurologically intact MSK Neurovascularly intact Sensation intact distally Intact pulses distally Dorsiflexion/Plantar flexion intact Can wiggle toes Calf soft and compressible Incision: dressing C/D/I   Assessment: 3 Days Post-Op  S/P Procedure(s)  (LRB): IRRIGATION AND DEBRIDEMENT OF RIGHT FOOT (Right) by Dr. Ernesta Amble. Percell Miller on 03/06/23  Principal Problem:   Sepsis ([place]) Active Problems:   Benign essential HTN   Chronic combined systolic and diastolic heart failure ([place])   ICD (implantable cardioverter-defibrillator) in place   Hemiparesis affecting left side as late effect of cerebrovascular accident [facility])   Poorly controlled type 2 diabetes mellitus with neuropathy ([place])   Atrial fibrillation ([place])   Diabetic foot infection ([place])   Cellulitis of right lower extremity   Type 2 diabetes mellitus with hyperglycemia, with long-term current use of insulin ([facility])   Foot abscess, right   Plan: Intra-op specimen showing rare STAPHYLOCOCCUS LUGDUNENSIS  Awaiting susceptibilities Continue IV ABX Will need ID consult  Have discussed the case with Dr. Sharol Given who will be taking over orthopedic care of her foot. She  will need to call his office and make an appt to be seen there within 1 week for wound check and discussion of next steps needed (which is likely an amputation).   Advance diet Up with therapy Incentive Spirometry Elevate and Apply ice  Weightbearing: WBAT RLE Insicional and dressing care: Reinforce dressings as needed Orthopedic device(s): CAM boot Showering: Keep dressing dry VTE prophylaxis: Lovenox 40mg  qd , SCDs, ambulation Pain control: continue current regimen Follow - up plan: 1 week in the office with Dr. Sharol Given   Dispo:  Ok to d/c from an orthopedic standpoint when medically ready. Should keep the dressings to the foot in place and make appt to see Dr. Sharol Given within 7 days.      Britt Bottom, PA-C Office [phone] 03/09/2023, 9:14 AM

## 2023-03-10 NOTE — Anesthesia Preprocedure Evaluation (Signed)
Anesthesia Evaluation  Patient identified by MRN, date of birth, ID band Patient awake    Reviewed: Allergy & Precautions, H&P , NPO status , Patient's Chart, lab work & pertinent test results  Airway Mallampati: III  TM Distance: >3 FB Neck ROM: Full    Dental  (+) Edentulous Upper, Edentulous Lower, Dental Advisory Given   Pulmonary asthma , sleep apnea and Continuous Positive Airway Pressure Ventilation    Pulmonary exam normal breath sounds clear to auscultation       Cardiovascular Exercise Tolerance: Good hypertension, Pt. on medications and Pt. on home beta blockers + CAD and +CHF  + dysrhythmias + pacemaker + Cardiac Defibrillator  Rhythm:Regular Rate:Normal  Echo 09/2022  1. Left ventricular ejection fraction, by estimation, is 25 to 30%. The left ventricle has severely decreased function. The left ventricle demonstrates global hypokinesis. There is moderate left ventricular hypertrophy. Left ventricular diastolic parameters are consistent with Grade I diastolic dysfunction (impaired relaxation).   2. Right ventricular systolic function is mildly reduced. The right ventricular size is normal. Tricuspid regurgitation signal is inadequate for assessing PA pressure.   3. The mitral valve is normal in structure. No evidence of mitral valve regurgitation.   4. The aortic valve is tricuspid. Aortic valve regurgitation is not visualized. No aortic stenosis is present.   5. The inferior vena cava is normal in size with greater than 50% respiratory variability, suggesting right atrial pressure of 3 mmHg.     Neuro/Psych Seizures -, Well Controlled,  PSYCHIATRIC DISORDERS Anxiety Depression    CVA, Residual Symptoms    GI/Hepatic Neg liver ROS,GERD  Medicated and Controlled,,  Endo/Other  diabetes, Type 2, Insulin Dependent, Oral Hypoglycemic Agents    Renal/GU Renal disease     Musculoskeletal  (+) Arthritis , Osteoarthritis,     Abdominal  (+) + obese  Peds  Hematology negative hematology ROS (+)   Anesthesia Other Findings   Reproductive/Obstetrics negative OB ROS                             Anesthesia Physical Anesthesia Plan  ASA: 3  Anesthesia Plan: General   Post-op Pain Management:    Induction: Intravenous  PONV Risk Score and Plan: 4 or greater and Ondansetron, Midazolam and Treatment may vary due to age or medical condition  Airway Management Planned: LMA  Additional Equipment: ClearSight and None  Intra-op Plan:   Post-operative Plan: Extubation in OR  Informed Consent: I have reviewed the patients History and Physical, chart, labs and discussed the procedure including the risks, benefits and alternatives for the proposed anesthesia with the patient or authorized representative who has indicated his/her understanding and acceptance.     Dental advisory given  Plan Discussed with: CRNA  Anesthesia Plan Comments:         Anesthesia Quick Evaluation

## 2023-03-11 ENCOUNTER — Other Ambulatory Visit (HOSPITAL_COMMUNITY): Payer: Self-pay

## 2023-03-11 ENCOUNTER — Other Ambulatory Visit: Payer: Self-pay | Admitting: Student

## 2023-03-11 ENCOUNTER — Telehealth (HOSPITAL_COMMUNITY): Payer: Self-pay | Admitting: Pharmacy Technician

## 2023-03-11 DIAGNOSIS — L02611 Cutaneous abscess of right foot: Secondary | ICD-10-CM | POA: Diagnosis not present

## 2023-03-11 DIAGNOSIS — A419 Sepsis, unspecified organism: Secondary | ICD-10-CM | POA: Diagnosis not present

## 2023-03-11 DIAGNOSIS — M869 Osteomyelitis, unspecified: Secondary | ICD-10-CM

## 2023-03-11 DIAGNOSIS — M86171 Other acute osteomyelitis, right ankle and foot: Secondary | ICD-10-CM | POA: Diagnosis not present

## 2023-03-11 DIAGNOSIS — L03115 Cellulitis of right lower limb: Secondary | ICD-10-CM | POA: Diagnosis not present

## 2023-03-11 LAB — BASIC METABOLIC PANEL
Anion gap: 11 (ref 5–15)
BUN: 10 mg/dL (ref 6–20)
CO2: 26 mmol/L (ref 22–32)
Calcium: 8.7 mg/dL — ABNORMAL LOW (ref 8.9–10.3)
Chloride: 92 mmol/L — ABNORMAL LOW (ref 98–111)
Creatinine, Ser: 1.16 mg/dL — ABNORMAL HIGH (ref 0.44–1.00)
GFR, Estimated: 55 mL/min — ABNORMAL LOW (ref >60)
Glucose, Bld: 312 mg/dL — ABNORMAL HIGH (ref 70–99)
Potassium: 4.1 mmol/L (ref 3.5–5.1)
Sodium: 129 mmol/L — ABNORMAL LOW (ref 135–145)

## 2023-03-11 LAB — AEROBIC/ANAEROBIC CULTURE W GRAM STAIN (SURGICAL/DEEP WOUND)

## 2023-03-11 LAB — CBC
HCT: 30.4 % — ABNORMAL LOW (ref 36.0–46.0)
Hemoglobin: 9.7 g/dL — ABNORMAL LOW (ref 12.0–15.0)
MCH: 26.3 pg (ref 26.0–34.0)
MCHC: 31.9 g/dL (ref 30.0–36.0)
MCV: 82.4 fL (ref 80.0–100.0)
Platelets: 287 10*3/uL (ref 150–400)
RBC: 3.69 MIL/uL — ABNORMAL LOW (ref 3.87–5.11)
RDW: 12.3 % (ref 11.5–15.5)
WBC: 14.7 10*3/uL — ABNORMAL HIGH (ref 4.0–10.5)
nRBC: 0 % (ref 0.0–0.2)

## 2023-03-11 LAB — GLUCOSE, CAPILLARY
Glucose-Capillary: 236 mg/dL — ABNORMAL HIGH (ref 70–99)
Glucose-Capillary: 271 mg/dL — ABNORMAL HIGH (ref 70–99)
Glucose-Capillary: 320 mg/dL — ABNORMAL HIGH (ref 70–99)
Glucose-Capillary: 438 mg/dL — ABNORMAL HIGH (ref 70–99)

## 2023-03-11 MED ORDER — INSULIN ASPART 100 UNIT/ML IJ SOLN
10.0000 [IU] | Freq: Once | INTRAMUSCULAR | Status: AC
  Administered 2023-03-11: 10 [IU] via SUBCUTANEOUS

## 2023-03-11 MED ORDER — ACETAMINOPHEN 500 MG PO TABS
1000.0000 mg | ORAL_TABLET | Freq: Three times a day (TID) | ORAL | 0 refills | Status: DC
  Filled 2023-03-11: qty 30, 5d supply, fill #0

## 2023-03-11 MED ORDER — METOPROLOL SUCCINATE ER 100 MG PO TB24
100.0000 mg | ORAL_TABLET | Freq: Every day | ORAL | 0 refills | Status: DC
  Filled 2023-03-11: qty 30, 30d supply, fill #0

## 2023-03-11 MED ORDER — DOXYCYCLINE HYCLATE 100 MG PO TABS
100.0000 mg | ORAL_TABLET | Freq: Two times a day (BID) | ORAL | 0 refills | Status: AC
  Filled 2023-03-11: qty 84, 42d supply, fill #0

## 2023-03-11 MED ORDER — INSULIN DETEMIR 100 UNIT/ML ~~LOC~~ SOLN
10.0000 [IU] | Freq: Once | SUBCUTANEOUS | Status: AC
  Administered 2023-03-11: 10 [IU] via SUBCUTANEOUS
  Filled 2023-03-11: qty 0.1

## 2023-03-11 MED ORDER — RIVAROXABAN 20 MG PO TABS: 20.0000 mg | ORAL_TABLET | Freq: Every day | ORAL | Status: DC

## 2023-03-11 MED ORDER — LACTATED RINGERS IV BOLUS
500.0000 mL | Freq: Once | INTRAVENOUS | Status: AC
  Administered 2023-03-11: 500 mL via INTRAVENOUS

## 2023-03-11 MED ORDER — OXYCODONE HCL 5 MG PO TABS
5.0000 mg | ORAL_TABLET | Freq: Once | ORAL | Status: AC
  Administered 2023-03-11: 5 mg via ORAL
  Filled 2023-03-11: qty 1

## 2023-03-11 MED ORDER — SENNOSIDES-DOCUSATE SODIUM 8.6-50 MG PO TABS: 2.0000 | ORAL_TABLET | Freq: Every day | ORAL | Status: DC

## 2023-03-11 MED ORDER — LIDOCAINE 5 % EX PTCH
1.0000 | MEDICATED_PATCH | CUTANEOUS | 0 refills | Status: DC
  Filled 2023-03-11: qty 30, 30d supply, fill #0

## 2023-03-11 MED ORDER — ISOSORBIDE MONONITRATE ER 60 MG PO TB24
90.0000 mg | ORAL_TABLET | Freq: Every day | ORAL | 0 refills | Status: DC
  Filled 2023-03-11: qty 45, 30d supply, fill #0

## 2023-03-11 MED ORDER — DOXYCYCLINE HYCLATE 100 MG PO TABS: 100.0000 mg | ORAL_TABLET | Freq: Two times a day (BID) | ORAL | Status: DC

## 2023-03-11 MED ORDER — INSULIN ASPART 100 UNIT/ML IJ SOLN
5.0000 [IU] | Freq: Once | INTRAMUSCULAR | Status: AC
  Administered 2023-03-11: 5 [IU] via SUBCUTANEOUS

## 2023-03-11 MED ORDER — INSULIN GLARGINE-YFGN 100 UNIT/ML ~~LOC~~ SOLN: 10.0000 [IU] | Freq: Once | SUBCUTANEOUS | Status: DC

## 2023-03-11 MED ORDER — TRULICITY 0.75 MG/0.5ML ~~LOC~~ SOAJ
0.7500 mg | SUBCUTANEOUS | 0 refills | Status: DC
  Filled 2023-03-11: qty 2, 28d supply, fill #0

## 2023-03-11 MED ORDER — INSULIN ASPART 100 UNIT/ML IJ SOLN: 10.0000 [IU] | Freq: Once | INTRAMUSCULAR | Status: DC

## 2023-03-11 MED ORDER — TRULICITY 0.75 MG/0.5ML ~~LOC~~ SOAJ: 0.7500 mg | SUBCUTANEOUS | 0 refills | Status: DC

## 2023-03-11 MED ORDER — LANTUS SOLOSTAR 100 UNIT/ML ~~LOC~~ SOPN
30.0000 [IU] | PEN_INJECTOR | Freq: Every day | SUBCUTANEOUS | 0 refills | Status: DC
  Filled 2023-03-11: qty 9, 30d supply, fill #0

## 2023-03-11 NOTE — Plan of Care (Signed)

## 2023-03-11 NOTE — Progress Notes (Signed)
Subjective: Paged by nursing staff for right foot pain. Our team went to reassess the patient. Upon arrival, patient was resting in bed.  Patient reports increased pain of her right foot this morning.  Reports that this pain is in the same area she had pain following her washout yesterday.  Reports that Tylenol has helped not helped her pain.  Denies fever or chills.   Objective: Constitutional: resting in bed, pleasant  Neurological: Alert and oriented to person, place, and time. Non-focal. Skin: warm and dry. Postsurgical wrap in place over the right foot without surrounding erythema or swelling.     Plan: Mild increase in WBC count this morning, however, had procedure done yesterday and received steroids.  Remains afebrile overnight.  HDS.  Low suspicion for worsening infection at this time.  Her pain is likely secondary to her procedure yesterday.  Per day team note, plan is to continue with scheduled Tylenol and as needed oxycodone.  Tylenol was given at 0530 without relief of her pain.  She is not scheduled for another dose of Tylenol until 2 PM.  It appears as though oxycodone was discontinued yesterday afternoon.  Had mild bump in creatinine this morning, therefore, would like to avoid NSAIDs at this time.  Will give her 1 dose of oxycodone for her right foot pain. -1x oxycodone 5 mg

## 2023-03-11 NOTE — Telephone Encounter (Signed)
Patient Advocate Encounter  Prior Authorization for Trulicity [id number] mg has been approved.    PA# [id number] Confirmation#:  [id number] W Insurance [place] Medicaid Effective dates: 03/11/2023 through 03/10/2024      Lyndel Safe, [place] Patient Advocate Specialist [place] Patient Advocate Team Direct Number: [phone]  Fax: [phone]

## 2023-03-11 NOTE — Progress Notes (Signed)
DISCHARGE NOTE HOME [name] to be discharged Home per MD order. Discussed prescriptions and follow up appointments with the patient. Prescriptions given to patient; medication list explained in detail. Patient verbalized understanding.  Skin clean, dry and intact without evidence of skin break down, no evidence of skin tears noted. IV catheter discontinued intact. Site without signs and symptoms of complications. Dressing and pressure applied. Pt denies pain at the site currently. No complaints noted.  Patient free of lines, drains, and wounds.   An After Visit Summary (AVS) was printed and given to the patient. Patient escorted via wheelchair, and discharged home via private auto.  Anastasio Auerbach, RN

## 2023-03-11 NOTE — Progress Notes (Signed)
Physical Therapy Treatment Patient Details Name: [name] MRN: [id number] DOB: 12/12/1966 Today's Date: 03/11/2023   History of Present Illness Patient is a 57 y/o female who presents on 3/23 with SOB, CP and right foot pain/swelling. s/p I&D in ED and s/p I&D right foot in OR on 3/24. Found to have sepsis secondary to diabetic wound of right foot. PMH includes CVA with left hemiparesis, ICD, CHF, A-fib, DM2, CAD, obesity, OSA on CPAP, seizures.    PT Comments    Pt greeted supine in bed, obviously distressed about home situation and stating need to get to home as soon as possible to "take care of things". Increased time spent educating pt on importance of safety with mobility for d/c to home with pt verbalizing understanding and agreeable to session. Pt able to complete x3 step pivot tranfers and come to standing x 4 throughout session with min A to complete and hemi-walker support on R. Pt reporting home aid or roommate always present and assisting with transfers at baseline and pt endorsing WC for home mobility as well. Reinforced education on assist needed for functional transfers and mobility post acutely and importance of continued mobility with pt verbalizing understanding. Pt continues to benefit from skilled PT services to progress toward functional mobility goals.    Recommendations for follow up therapy are one component of a multi-disciplinary discharge planning process, led by the attending physician.  Recommendations may be updated based on patient status, additional functional criteria and insurance authorization.  Follow Up Recommendations       Assistance Recommended at Discharge Frequent or constant Supervision/Assistance  Patient can return home with the following A lot of help with walking and/or transfers;Assistance with cooking/housework;A lot of help with bathing/dressing/bathroom;Help with stairs or ramp for entrance;Assist for transportation   Equipment  Recommendations  Other (comment) (hemi-walker)    Recommendations for Other Services       Precautions / Restrictions Precautions Precautions: Fall Precaution Comments: left hemiparesis from CVA 9 years ago per pt report Required Braces or Orthoses: Other Brace Other Brace: post op shoe on right Restrictions Weight Bearing Restrictions: Yes RLE Weight Bearing: Weight bearing as tolerated     Mobility  Bed Mobility Overal bed mobility: Needs Assistance Bed Mobility: Supine to Sit     Supine to sit: HOB elevated, Min guard     General bed mobility comments: min guard for safety, cues to scoot out to EOB, total A to don post op boot    Transfers Overall transfer level: Needs assistance Equipment used: Hemi-walker Transfers: Sit to/from Stand, Bed to chair/wheelchair/BSC Sit to Stand: Min assist   Step pivot transfers: Min assist       General transfer comment: min A to rise to to stand and steady once standing, min A to step pivot EOB<>BSC, EOB>chair    Ambulation/Gait               General Gait Details: pt able to take a few side steps at beside and a few steps around to chair, pt stating she wears L AFO at [place] and is able to move better with it on   Stairs             Wheelchair Mobility    Modified Rankin (Stroke Patients Only)       Balance Overall balance assessment: Needs assistance Sitting-balance support: Feet supported, No upper extremity supported Sitting balance-Leahy Scale: Fair Sitting balance - Comments: VC's to scoot to EOB and place feet on  floor. supervision for safety.   Standing balance support: During functional activity, Single extremity supported Standing balance-Leahy Scale: Poor Standing balance comment: Requires unilateral UE support and external support with hemi walker                            Cognition Arousal/Alertness: Awake/alert Behavior During Therapy: WFL for tasks  assessed/performed Overall Cognitive Status: Within Functional Limits for tasks assessed                                 General Comments: pt with multitude of family/home issues with pt verbalizing desire to get home ASAP. educated pt on importance of safety with mobility and medical readiness        Exercises      General Comments General comments (skin integrity, edema, etc.): VSS on RA      Pertinent Vitals/Pain Pain Assessment Pain Assessment: Faces Faces Pain Scale: Hurts a little bit Pain Location: RLE Pain Descriptors / Indicators: Grimacing, Guarding Pain Intervention(s): Monitored during session, Limited activity within patient's tolerance    Home Living                          Prior Function            PT Goals (current goals can now be found in the care plan section) Acute Rehab PT Goals Patient Stated Goal: to go home PT Goal Formulation: With patient Time For Goal Achievement: 03/21/23 Progress towards PT goals: Progressing toward goals    Frequency    Min 3X/week      PT Plan      Co-evaluation              AM-PAC PT "6 Clicks" Mobility   Outcome Measure  Help needed turning from your back to your side while in a flat bed without using bedrails?: A Lot Help needed moving from lying on your back to sitting on the side of a flat bed without using bedrails?: A Lot Help needed moving to and from a bed to a chair (including a wheelchair)?: A Little Help needed standing up from a chair using your arms (e.g., wheelchair or bedside chair)?: A Little Help needed to walk in hospital room?: A Lot Help needed climbing 3-5 steps with a railing? : Total 6 Click Score: 13    End of Session   Activity Tolerance: Patient tolerated treatment well Patient left: in chair;with call bell/phone within reach Nurse Communication: Mobility status PT Visit Diagnosis: Pain;Unsteadiness on feet (R26.81);Muscle weakness (generalized)  (M62.81) Pain - Right/Left: Right Pain - part of body: Ankle and joints of foot     Time: [id number] PT Time Calculation (min) (ACUTE ONLY): 33 min  Charges:  $Therapeutic Activity: 23-37 mins                     Wilder Amodei R. PTA Acute Rehabilitation Services Office: [place] 03/11/2023, 11:43 AM

## 2023-03-11 NOTE — Discharge Summary (Signed)
Name: [name] MRN: [id number] DOB: 07/09/1966 57 y.o. PCP: Dorethea Clan, DO  Date of Admission: 03/05/2023  2:07 PM Date of Discharge: 03/11/2023 Attending Physician: Dr. Lottie Mussel, MD  Discharge Diagnosis: Principal Problem:   Sepsis [facility]) Active Problems:   Benign essential HTN   Chronic combined systolic and diastolic heart failure ([place])   ICD (implantable cardioverter-defibrillator) in place   Hemiparesis affecting left side as late effect of cerebrovascular accident [facility])   Poorly controlled type 2 diabetes mellitus with neuropathy ([place])   Atrial fibrillation ([place])   Diabetic foot infection ([place])   Cellulitis of right lower extremity   Type 2 diabetes mellitus with hyperglycemia, with long-term current use of insulin ([place])   Foot abscess, right   Pyogenic inflammation of bone ([place])    Discharge Medications: Allergies as of 03/11/2023       Reactions   Ace Inhibitors Anaphylaxis, Other (See Comments)   Swelling of the tongue and throat    Cleocin [clindamycin] Anaphylaxis, Swelling   Enalapril Maleate Anaphylaxis, Other (See Comments)   Swelling of the tongue and throat    Zestril [lisinopril] Anaphylaxis, Other (See Comments)   Swelling of the tongue and throat         Medication List     STOP taking these medications    furosemide 40 MG tablet Commonly known as: LASIX       TAKE these medications    Accu-Chek FastClix Lancets Misc USE TO CHECK BLOOD SUGAR TWICE DAILY   Accu-Chek Guide test strip Generic drug: glucose blood USE TO CHECK BLOOD SUGAR TWICE DAILY   Accu-Chek Guide w/Device Kit Use As Directed   acetaminophen 500 MG tablet Commonly known as: TYLENOL Take 2 tablets (1,000 mg total) by mouth every 8 (eight) hours.   albuterol 108 (90 Base) MCG/ACT inhaler Commonly known as: VENTOLIN HFA Inhale 2 puffs into the lungs every 6 (six) hours as needed for wheezing or shortness of breath.   amLODipine 10 MG  tablet Commonly known as: NORVASC Take 1 tablet (10 mg total) by mouth daily.   atorvastatin 40 MG tablet Commonly known as: LIPITOR Take 1 tablet (40 mg total) by mouth daily.   busPIRone 7.5 MG tablet Commonly known as: BUSPAR Take 1 tablet (7.5 mg total) by mouth 2 (two) times daily.   Comfort EZ Pen Needles 31G X 8 MM Misc Generic drug: Insulin Pen Needle USE TO INJECT INSULIN IN THE MORNING, NOON, EVENING, AND BEDTIME   cyanocobalamin 1000 MCG tablet Take 1 tablet by mouth daily.   cyclobenzaprine 5 MG tablet Commonly known as: FLEXERIL TAKE ONE TABLET BY MOUTH THREE TIMES DAILY AS NEEDED FOR MUSCLE SPASMS What changed: See the new instructions.   diclofenac Sodium 1 % Gel Commonly known as: VOLTAREN APPLY 4 GRAMS TOPICALLY TO LEFT KNEE FOUR TIMES DAILY. What changed: See the new instructions.   doxycycline 100 MG tablet Commonly known as: VIBRA-TABS Take 1 tablet (100 mg total) by mouth every 12 (twelve) hours.   escitalopram 10 MG tablet Commonly known as: LEXAPRO Take 1 tablet (10 mg total) by mouth daily.   fluticasone 0.05 % cream Commonly known as: CUTIVATE Apply 1 Application topically 2 (two) times daily.   folic acid 1 MG tablet Commonly known as: FOLVITE Take 1 tablet (1 mg total) by mouth daily.   gabapentin 300 MG capsule Commonly known as: NEURONTIN Take 1 capsule (300 mg total) by mouth 2 (two) times daily.   hydrALAZINE 25  MG tablet Commonly known as: APRESOLINE Take 1 tablet (25 mg total) by mouth 3 (three) times daily.   isosorbide mononitrate 60 MG 24 hr tablet Commonly known as: IMDUR Take 1.5 tablets (90 mg total) by mouth daily.   Klor-Con M20 20 MEQ tablet Generic drug: potassium chloride SA TAKE ONE TABLET BY MOUTH DAILY What changed: how much to take   Lantus SoloStar 100 UNIT/ML Solostar Pen Generic drug: insulin glargine Inject 30 Units into the skin at bedtime. What changed: See the new instructions.   levETIRAcetam  500 MG tablet Commonly known as: KEPPRA TAKE ONE TABLET BY MOUTH TWICE DAILY   lidocaine 5 % Commonly known as: LIDODERM Place 1 patch onto the skin daily. Remove & Discard patch within 12 hours or as directed by MD   Linzess 290 MCG Caps capsule Generic drug: linaclotide TAKE ONE CAPSULE BY MOUTH EVERY DAY AS NEEDED FOR CONSTIPATION What changed: See the new instructions.   liver oil-zinc oxide 40 % ointment Commonly known as: DESITIN Apply topically every 4 (four) hours. What changed:  how much to take when to take this reasons to take this   metoprolol succinate 100 MG 24 hr tablet Commonly known as: TOPROL-XL Take 1 tablet (100 mg total) by mouth daily. What changed: See the new instructions.   pantoprazole 40 MG tablet Commonly known as: PROTONIX TAKE ONE TABLET BY MOUTH EVERY DAY   spironolactone 100 MG tablet Commonly known as: ALDACTONE TAKE ONE TABLET BY MOUTH EVERY DAY   Stool Softener/Laxative 8.6-50 MG tablet Generic drug: senna-docusate TAKE ONE TABLET BY MOUTH AT BEDTIME AS NEEDED FOR MILD CONSTIPATION What changed: See the new instructions.   traZODone 50 MG tablet Commonly known as: DESYREL Take 1 tablet (50 mg total) by mouth at bedtime.   Trulicity [id number] Sopn Generic drug: Dulaglutide Inject 0.75 mg into the skin once a week.   Vitamin D (Ergocalciferol) 1.25 MG (50000 UNIT) Caps capsule Commonly known as: DRISDOL TAKE ONE CAPSULE BY MOUTH EVERY 7 DAYS What changed: See the new instructions.   Xarelto 20 MG Tabs tablet Generic drug: rivaroxaban TAKE ONE TABLET BY MOUTH EVERY DAY WITH SUPPER What changed: See the new instructions.               Durable Medical Equipment  (From admission, onward)           Start     Ordered   03/11/23 1258  For home use only DME Walker hemi  Once       Question:  Patient needs a walker to treat with the following condition  Answer:  Osteomyelitis   03/11/23 1258             Disposition and follow-up:   [name] was discharged from [facility] in Stable condition.  At the hospital follow up visit please address:  1.  Follow-up:  a.  Right fifth metatarsal osteomyelitis: Patient had 2 washouts during hospitalization.  Patient discharged on doxycycline 100 mg twice daily for the next 6 weeks.  Patient to follow-up with orthopedics and infectious disease.  Ensure patient remains fever free, and adherent to her medications.  If any signs of systemic infection, will likely need readmission for IV antibiotics    b.  Type 2 diabetes mellitus: Increased insulin to 30 units nightly.  Sugars remained uncontrolled in hospital.  Continue to titrate insulin in the outpatient setting.  Patient's A1c did decrease to 10.4.   c.  HFrEF: Discontinued patient's Lasix given patient's had kidney injury during hospitalization.  Did resume all other GDMT.  Continue to assess volume status outpatient.   d.  AKI: Patient had likely prerenal AKI during hospitalization.  Ensure creatinine back to baseline at follow-up.  2.  Labs / imaging needed at time of follow-up: CBC, CMP  3.  Pending labs/ test needing follow-up: N/A  4.  Medication Changes  Discontinued patient's Lasix.  Discharge patient on doxycycline 100 mg twice daily.  Follow-up Appointments:  Follow-up Information     [facility] [place] Follow up.   Specialty: Home Health Services Why: Someone will call you to schedule first home visit. If you have not received a call after two days of discharging home, call their number listed. If no one comes to assess, call Case Manager at [phone]. Contact information: [address] [place] [address] [phone]         Newt Minion, MD. Schedule an appointment as soon as possible for a visit in 1 week(s).   Specialty: Orthopedic Surgery Contact information: [place] [address] [phone]          [name], MD. Go on 03/21/2023.   Specialty: Internal Medicine Why: Please show up to your appointment with Dr. Allyson Sabal at 2:15pm at the internal medicine clinic Contact information: [place] [address] [phone]         Laurice Record, MD. Go on 03/24/2023.   Specialty: Infectious Diseases Why: Please show up to your appointment with Dr. Candiss Norse for your infectious disease appointment as you will likely be on antibiotics for at least the next 6 weeks. Contact information: [address] [place] [address] [place] [facility] Course by problem list: [name] is a 57 y.o. person living with a history of type 2 diabetes, HFrEF, atrial fibrillation who presents for chest pain.  Patient found to have right foot erythema and swelling concerning for cellulitis.  Patient was SIRS positive with likely source of infection from foot wound.  Patient admitted for further evaluation and management of sepsis likely secondary to diabetic foot wound of right foot.   #Diabetic foot infection of right foot likely secondary to Staphylococcus Lugdenesis and Staphylococcus Warneri #Osteomyelitis of right 5th metatarsal head #Leukocytosis, stable  Patient presented initially to the emergency room after having some chest pains.  She described the chest pain to be sharp chest pains in the middle of her chest.  This chest pain initially brought her to the emergency room.  On examination, patient found to have right lower extremity swelling.  Imaging revealed concerns for gas-forming bacteria.  Patient admitted for diabetic foot infection.  She was initially started on broad-spectrum antibiotics with Zosyn and linezolid.  She had 2 washouts, and transitioned to doxycycline 100 mg twice daily for the next 6 weeks. Micro grew  Staphylococcus Lugdenesis and and Staphylococcus Warneri.  Patient to follow-up with orthopedics in the outpatient  setting.  Patient also has follow-up with infectious disease.    #Chest pain This was likely musculoskeletal chest pain.  EKG did not show any initial ST segment changes.  Tropes were negative.  On exam, patient did have chest tenderness.  Patient was given lidocaine patch, which tremendously helped her pain.  She denies any pain on discharge.   #Type II Diabetes Mellitus  Patient's initial home  dose of long-acting insulin was 25 units.  During hospitalization, patient blood sugars remain uncontrolled.  Increase to 30 units and added sliding scale.  Patient will continue to need outpatient follow-up.  A1c was 10.4 down from 13.3 about 5 months ago.  Patient discharged on 30 units of long-acting insulin.  Patient was unable to pick up Trulicity from previous office visit, I sent Trulicity to her pharmacy.    #CAD #HFrEF No acute concern for exacerbation or ACS during hospitalization.  She remained hemodynamically stable.  Resumed home GDMT on discharge.  Did discontinue Lasix.    #Atrial fibrillation Patient remained rate controlled during hospitalization.  She remained on metoprolol succinate 100 mg daily.  As patient was requiring procedure, did hold Xarelto.  Resumed Xarelto on discharge.   #Hypertension Blood pressure remained low tensive during hospitalization.  Continued on home meds.  Did stop spironolactone for some time given AKI, but resumed on discharge.  Did also stop patient's Lasix on discharge.   #AKI Patient had likely prerenal AKI during hospitalization.  Encouraged patient to have good p.o. intake.  Did hold spironolactone.  Did also hold Lasix.  Resumed spironolactone on discharge.    #Anxiety and depression Patient continued on home Lexapro 10 mg daily and BuSpar 7.5 mg twice daily during hospitalization.     #Seizure disorder Patient remained on Keppra 500 mg twice daily during hospitalization.  No seizures during hospitalization.   Discharge Subjective:  Patient  evaluated at bedside today.  She states she is ready go home.  Patient states that she is improving, and was up and walking with no concerns.  She denies any fevers or chills.  She denies any chest pain or shortness of breath.  Discharge Exam:   BP (!) 144/75 (BP Location: Left Arm)   Pulse 75   Temp 97.6 F (36.4 C) (Oral)   Resp 18   Ht 5\' 9"  (1.753 m)   Wt 99.8 kg   LMP 09/13/2019 (Exact Date)   SpO2 99%   BMI 32.49 kg/m  Constitutional: Well-appearing, resting bed, no acute distress HENT: normocephalic atraumatic Cardiovascular: regular rate and rhythm, no m/r/g Pulmonary/Chest: normal work of breathing on room air, lungs clear to auscultation bilaterally Abdominal: soft, non-tender, non-distended Extremities: Right lower extremity with Ace bandage wrapped.  Minimal edema appreciated.  No erythema appreciated.  No drainage noted through bandage.  Pertinent Labs, Studies, and Procedures:     Latest Ref Rng & Units 03/11/2023    3:51 AM 03/10/2023    1:58 AM 03/09/2023   12:50 AM  CBC  WBC 4.0 - 10.5 K/uL 14.7  10.6  11.3   Hemoglobin 12.0 - 15.0 g/dL 9.7  9.1  9.0   Hematocrit 36.0 - 46.0 % 30.4  29.2  28.1   Platelets 150 - 400 K/uL 287  252  231        Latest Ref Rng & Units 03/11/2023    3:51 AM 03/10/2023    1:58 AM 03/09/2023   12:50 AM  CMP  Glucose 70 - 99 mg/dL 312  174  272   BUN 6 - 20 mg/dL 10  5  5    Creatinine 0.44 - 1.00 mg/dL 1.16  0.81  0.88   Sodium 135 - 145 mmol/L 129  135  132   Potassium 3.5 - 5.1 mmol/L 4.1  3.4  3.6   Chloride 98 - 111 mmol/L 92  94  95   CO2 22 - 32 mmol/L 26  27  26   Calcium 8.9 - 10.3 mg/dL 8.7  9.0  8.6     CT FOOT RIGHT W CONTRAST  Result Date: 03/05/2023 CLINICAL DATA:  Diabetic osteomyelitis suspected.  Foot swelling. EXAM: CT OF THE LOWER RIGHT EXTREMITY WITH CONTRAST TECHNIQUE: Multidetector CT imaging of the lower right extremity was performed according to the standard protocol following intravenous contrast  administration. RADIATION DOSE REDUCTION: This exam was performed according to the departmental dose-optimization program which includes automated exposure control, adjustment of the mA and/or kV according to patient size and/or use of iterative reconstruction technique. CONTRAST:  148mL OMNIPAQUE IOHEXOL 350 MG/ML SOLN COMPARISON:  Right foot radiograph dated 03/05/2023. FINDINGS: Bones/Joint/Cartilage There is no acute fracture or dislocation. The bones are osteopenic. No significant arthritic changes. The ankle mortise is intact. There may be small focal bone erosion of the lateral fifth metatarsal head (axial 57/5). However, no definite bone erosion seen on the other views. There is soft tissue gas adjacent to the fifth MTP joint suspicious for an infectious process. Eighth there is clinical concern for acute osteomyelitis, further evaluation with MRI or a white blood cell nuclear scan is recommended. Ligaments Suboptimally assessed by CT. Muscles and Tendons No acute findings.  No intramuscular fluid collection or hematoma. Soft tissues Soft tissue gas at the fifth MTP joint extending from the plantar to dorsal skin suspicious for gas-forming infection. There is diffuse skin thickening and subcutaneous edema of the dorsum of the foot which may represent cellulitis. No drainable fluid collection or abscess. There is skin wound of the dorsal and plantar aspect of the lateral foot at the level of the fifth MTP joint. There is a 3 mm radiopaque fragment within the plantar skin wound. Correlation with clinical exam recommended. IMPRESSION: 1. No acute fracture or dislocation. 2. Soft tissue gas with possible small focus of bone erosion involving the fifth metatarsal head concerning for gas-forming infection or osteomyelitis. Further evaluation with MRI or a white blood cell nuclear scan recommended. No drainable fluid collection or abscess. 3. Skin wound of the dorsal and plantar aspect of the lateral foot at the  level of the fifth MTP joint. There is a 3 mm radiopaque fragment within the plantar skin wound. Correlation with clinical exam recommended. Electronically Signed   By: Anner Crete M.D.   On: 03/05/2023 19:29   DG Chest Port 1 View  Result Date: 03/05/2023 CLINICAL DATA:  Cellulitis versus abscess of foot, chest pain EXAM: PORTABLE CHEST 1 VIEW COMPARISON:  Portable exam 1422 hours compared to 09/12/2022 FINDINGS: LEFT subclavian ICD with leads projecting over RIGHT atrium, RIGHT ventricle, and coronary sinus. Upper normal heart size. Mediastinal contours and pulmonary vascularity normal. Lungs clear. No infiltrate, pleural effusion, or pneumothorax. IMPRESSION: No acute abnormalities. Electronically Signed   By: Lavonia Dana M.D.   On: 03/05/2023 14:51   DG Foot Complete Right  Result Date: 03/05/2023 CLINICAL DATA:  Chest pain, type II diabetes mellitus, foot wound, abscess versus cellulitis of RIGHT foot EXAM: RIGHT FOOT COMPLETE - 3+ VIEW COMPARISON:  None available FINDINGS: Mild osseous demineralization. Minimal degenerative changes first MTP joint. Remaining joint spaces preserved. No acute fracture or dislocation. Large focus of soft tissue gas and swelling at the lateral margin of the fifth MTP joint with several associated angular radiopaque foreign bodies, 4 mm length adjacent to distal fifth metatarsal and 4 x 2 mm at the level of the fifth MTP joint, question glass fragments. Calcaneal spurring. No definite bone destruction identified to suggest  osteomyelitis. IMPRESSION: No acute osseous abnormalities. Large area of soft tissue gas and soft tissue swelling at the lateral margin of the fifth MTP joint with several associated radiopaque foreign bodies question glass fragments. Findings are consistent with abscess/infection by gas-forming organism. No radiographic evidence of osteomyelitis though if this remains a clinical concern, consider MR assessment. Electronically Signed   By: Lavonia Dana M.D.   On: 03/05/2023 14:50     Discharge Instructions:  Ms. Diara Reifsnyder, It was a pleasure taking care of you at [facility] were admitted for Diabetic foot infection and osteomyelitis and treated with surgery and antibiotics. We are discharging you home now that you are doing better. Please follow the following instructions.   1) Regarding your infection, there is lot of follow-up that you need.  First and foremost, we are sending you home on new antibiotics.  The name of this antibiotic is doxycycline.  Please take this antibiotic, 100 mg two times a day.  1 time in the morning, 1 time in the evening.  2) We are also stopping your lasix right now, please talk to your doctor to see if you need to be taking it in the outpatient setting. I will also write a note to your doctor to consider if you need to be on it or not.  3) you have many follow-up appointments.  The first one is with Dr. Sharol Given.  It has not been scheduled yet, but you need to call that office and get an appointment with Dr. Sharol Given.  The phone number is on this paper.  4) The next appointment is with Dr. Allyson Sabal who works in the internal medicine center where you have your PCP.  Please go to your appointment on 03/21/2023 at 2:15 PM.  5) The final appointment is with Dr. Candiss Norse who is the infectious disease doctor. Please follow up with her at her clinic. Your appointment is on 03/24/2023 at 9:30 AM.  Please show up to this appointment.  6) Please take all of your other medications as prescribed.  I have refilled some your medications.  Please call the [facility] clinic if you need other refills.  7) I have increased your insulin at bedtime to 30 units nightly. Please make sure that you take this insulin. Ensure that you do not miss any doses and follow up with your primary care physician to ensure that your diabetes is controlled.   Take care,  Dr. Leigh Aurora, DO   Signed: Leigh Aurora, DO 03/11/2023, 1:57 PM    Pager: [phone]

## 2023-03-11 NOTE — TOC Transition Note (Signed)
[facility]) - CM/SW Discharge Note   Patient Details  Name: [name] MRN: [id number] Date of Birth: 1966-11-16  Transition of Care [facility]) CM/SW Contact:  Tom-Johnson, Renea Ee, RN Phone Number: 03/11/2023, 3:15 PM   Clinical Narrative:     Patient is scheduled for discharge today. Readmission Prevention Assessment done.  Outpatient referrals, hospital f/u and discharge instructions on AVS. Some prescription sent to [place], meds to be delivered to patient at bedside prior to discharge and also sent to [place]. Patient to pick up at discharge. Home health info on AVS. Hemi walker ordered from [employer] and Erasmo Downer to deliver at bedside. Family to transport at discharge. No further TOC needs noted.   Final next level of care: Home w Home Health Services Barriers to Discharge: Barriers Resolved   Patient Goals and CMS Choice CMS Medicare.gov Compare Post Acute Care list provided to:: Patient Choice offered to / list presented to : Patient  Discharge Placement                  Patient to be transferred to facility by: Family      Discharge Plan and Services Additional resources added to the After Visit Summary for     Discharge Planning Services: CM Consult Post Acute Care Choice: Home Health          DME Arranged: Gilford Rile hemi DME Agency: [employer] Date DME Agency Contacted: 03/11/23 Time DME Agency Contacted: [phone] Representative spoke with at DME Agency: [place]: PT, OT, RN (Wound care.) [facility] Agency: [place] Date [facility] Agency Contacted: 03/07/23 Time [place]: [phone] Representative spoke with at [place]: [place] Determinants of Health ([place]) Interventions SDOH Screenings   Food Insecurity: Food Insecurity Present (03/10/2023)  Housing: Low Risk  (03/10/2023)  Transportation Needs: Unmet Transportation Needs (03/10/2023)  Utilities: Not At Risk (03/10/2023)  Depression (PHQ2-9): High Risk  (01/20/2023)  Financial Resource Strain: Patient Declined (11/18/2022)  Tobacco Use: Low Risk  (03/10/2023)     Readmission Risk Interventions    03/11/2023    3:14 PM  Readmission Risk Prevention Plan  Transportation Screening Complete  PCP or Specialist Appt within 5-7 Days Complete  Home Care Screening Complete  Medication Review (RN CM) Referral to Pharmacy

## 2023-03-11 NOTE — Progress Notes (Signed)
Mobility Specialist Progress Note   03/11/23 1540  Mobility  Activity Stood at bedside  Level of Assistance +2 (takes two people)  Assistive Device Other (Comment) (Hemi Walker)  Distance Ambulated (ft) 2 ft  Activity Response Tolerated well  Mobility Referral Yes  $Mobility charge 1 Mobility   NT prepping pt for d/c and requesting some assistance. +2A MinA to perform simple clothing ADL's. Required total assist to donn orthotics on BLE. No faults during ADL's, returned back to EOB awaiting transport.    [place]land Falling Mobility Specialist Please contact via SecureChat or  Rehab office at [phone]

## 2023-03-11 NOTE — Progress Notes (Signed)
Pt. Requesting to speak with this Probation officer, upon entering room, family member was visiting and patient and family member arguing, family member left, pt. Crying, states she needs to speak with MD, she wants to be discharged or she will sign herself out AMA, pt. Educated about risks of signing herself out AMA, pt. Verbalized understanding and still insists on going home, Dr. Posey Pronto aware, will come see patient.  [name]

## 2023-03-11 NOTE — Progress Notes (Signed)
Unable to reach patient for monthly ICM remote follow up and not receiving monthly remote transmission.  Patient disenrolled due patient is not actively participating in ICM clinic.  Device clinic 91 day remote monitoring will continue per protocol.   ?

## 2023-03-11 NOTE — Consult Note (Signed)
[place] for Infectious Disease    Date of Admission:  03/05/2023   Total days of inpatient antibiotics 7        Reason for Consult: osteo    Principal Problem:   Sepsis ([place]) Active Problems:   Benign essential HTN   Chronic combined systolic and diastolic heart failure (HCC)   ICD (implantable cardioverter-defibrillator) in place   Hemiparesis affecting left side as late effect of cerebrovascular accident [facility])   Poorly controlled type 2 diabetes mellitus with neuropathy (HCC)   Atrial fibrillation ([place])   Diabetic foot infection ([place])   Cellulitis of right lower extremity   Type 2 diabetes mellitus with hyperglycemia, with long-term current use of insulin (HCC)   Foot abscess, right   Pyogenic inflammation of bone (HCC)   Assessment: 57 year old female with congestive heart failure, diabetes mellitus admitted with right foot osteomyelitis   #DFU right foot - Patient temp 1.6, WBC 16 K on arrival.  Reports foot was red for a week prior to presentation. - Ambulates with cane, wheelchair is at times at baseline - CT showed soft tissue gas with possible small focus of bone erosion of the fifth metatarsal head concerning for gas-forming infection/osteomyelitis.  No drainable fluid collection.  Radiopaque 3 mm fragment plantar skin wound - ABIs showed normal right and left TBI/ABI - Orthopedics engaged, underwent I&D on 3/24 with cultures growing staph lugdunensis, urinary - Per or culturespurulent fluid evacuated from plantar surface, noted debridement necrotic bone around fifth metatarsal head.  - Repeat I&D on 3/28 Follow-up wound check with Dr. Sharol Given next week for next steps(amputation?). Recommendations:  -D/C linezolid and pip-tazo -Start doxy 100 mg PO bid  for 6 weeks from OR or till  possible amputation whichever is  first -F/U with Dr. Sharol Given on Tuesday  #Diabetes mellitus - A1c 10.4 on 03/06/2023   ID will sign off  I have personally spent 120  minutes involved in face-to-face and non-face-to-face activities for this patient on the day of the visit. Professional time spent includes the following activities: Preparing to see the patient (review of tests), Obtaining and/or reviewing separately obtained history (admission/discharge record), Performing a medically appropriate examination and/or evaluation , Ordering medications/tests/procedures, referring and communicating with other health care professionals, Documenting clinical information in the EMR, Independently interpreting results (not separately reported), Communicating results to the patient/family/caregiver, Counseling and educating the patient/family/caregiver and Care coordination (not separately reported).   Microbiology:   Antibiotics: Pip-tazo 3/23/present Vancomycin 3/23, 24, 3/28 Linezolid 3/23-present  Cultures: Blood 3/22 no growth OR  3/24 staph lugdunensis, Staphylococcus warneri   HPI: [name] is a 57 y.o. female with history of diabetes prior CVA, combined systolic diastolic heart failure, hyperlipidemia, hypertension admitted with right fifth metatarsal osteomyelitis.  She presented to the ED with chest pain.  She had right foot pain for about a week prior to presentation as well.  She did not realize she had redness tracking up her leg.  Reported wound in the same area about 6 years ago.  Ambulates with a cane, sometimes wheelchair. On arrival to the ED she had a temp of 101.6, WBC 16 K.  Started on vancomycin, pip-tazo, linezolid then transition to pip-tazo and linezolid.  CT showed soft tissue gas with possible small focus of bone erosion of the fifth metatarsal head concerning for gas-forming infection/osteomyelitis.  No drainable fluid collection.  Radiopaque 3 mm fragment plantar skin wound.  I&D was done by ED physician,  no cultures.  Orthopedics consulted, patient underwent I&D on 3/24 purulent fluid evacuated from plantar surface, noted debridement  necrotic bone around fifth metatarsal head.  Cultures grew staph lugdunensis.  Patient taken back to the OR on 3/28, no cultures.  ID engaged for antibiotic recommendations.  Review of Systems: ROS  Past Medical History:  Diagnosis Date   Acute cystitis 06/19/2021   AKI (acute kidney injury) ([place]) 09/13/2022   Allergic rhinitis    Arthritis    "hips, back, legs, arms" (07/04/2014)   Asthma    hx   Automatic implantable cardioverter-defibrillator in situ    Calcifying tendinitis of shoulder    Chronic combined systolic and diastolic CHF (congestive heart failure) (HCC)    EF 40-45% by echo 12/06/2012   Chronic diastolic heart failure ([place])     Primarily diastolic CHF: Likely due to uncontrolled HTN. Last echo (8/12) with EF 45-50%, mild to moderate LVH with some asymmetric septal hypertrophy, RV normal size and systolic function. EF 50-55% by LV-gram in 6/12.    Chronic lower back pain    secondary to DJD, obsetiy, hip problems. Followed by Dr. Oval Linsey (pain management)   Chronic systolic heart failure ([place]) 05/17/2022   Chronic use of opiate for therapeutic purpose 12/14/2016   Contact with and (suspected) exposure to covid-19 02/07/2020   Coronary artery disease    questionable. [place] 05/2011 showing normal coronaries // Followed at [facility], Dr. Aundra Dubin   Degeneration of lumbar or lumbosacral intervertebral disc    DJD (degenerative joint disease) of hip    right sided   Fatigue 04/27/2022   Frequent UTI    GERD (gastroesophageal reflux disease)    HLD (hyperlipidemia)    Hypertension    Poorly controlled. Has had HTN since age 89. Angioedema with ACEI.  24 Hr urine and renal arterial dopplers ordered . . . Never done   LBBB (left bundle branch block)    Left shoulder pain 06/30/2015   Left spastic hemiparesis ([place]) 09/23/2015   Liver disease    Lumbago 11/22/2016   Morbid obesity ([place])    Muscle spasm 06/19/2021   Need for pneumococcal vaccine 09/09/2017    NICM (nonischemic cardiomyopathy) ([place])    EF 45-50% in 8/12, cath 6/12 showed normal coronaries, EF 50-55% by LV gram   OSA on CPAP    sleep study in 8/12 showed moderate to severe OSA requiring CPAP   Perimenopausal 03/28/2017   Polyneuropathy in diabetes(357.2)    Presence of permanent cardiac pacemaker    Rash 07/14/2016   Seizures ([place])    last 3 months   Shortness of breath    none now   Stroke [facility]) 12/2013   "my left side is paralyzed" (07/04/2014)   Thoracic or lumbosacral neuritis or radiculitis, unspecified    Type II diabetes mellitus ([place]) DX: 2002   Urinary frequency 11/05/2014   Vaginal discharge 02/05/2021    Social History   Tobacco Use   Smoking status: Never   Smokeless tobacco: Never  Vaping Use   Vaping Use: Never used  Substance Use Topics   Alcohol use: No    Alcohol/week: 0.0 standard drinks of alcohol   Drug use: No    Family History  Problem Relation Age of Onset   Heart disease Mother 6       Died of MI at age 58 yo   Kidney disease Mother        requiring dialysis   Congestive Heart Failure Mother  Heart disease Father 61       MI age 66yo requiring stenting   Diabetes Father    Glaucoma Father    Heart disease Paternal Grandmother        requiring pacemaker.   Heart disease Paternal Grandfather 52       Died of MI at possibly age 34-53yo   Stroke Paternal Grandfather    Diabetes Brother    Heart disease Brother 15       MI at age 40 years old   Breast cancer Paternal Aunt    Breast cancer Maternal Grandmother    Scheduled Meds:  acetaminophen  1,000 mg Oral Q8H   amLODipine  10 mg Oral Daily   atorvastatin  40 mg Oral Daily   busPIRone  7.5 mg Oral BID   doxycycline  100 mg Oral Q12H   escitalopram  10 mg Oral Daily   gabapentin  300 mg Oral BID   insulin aspart  0-20 Units Subcutaneous TID WC   insulin aspart  5 Units Subcutaneous TID WC   insulin detemir  30 Units Subcutaneous QHS   isosorbide mononitrate  90 mg Oral  Daily   levETIRAcetam  500 mg Oral BID   lidocaine  1 patch Transdermal Q24H   metoprolol succinate  100 mg Oral Daily   rivaroxaban  20 mg Oral Q supper   senna-docusate  2 tablet Oral QHS   traZODone  50 mg Oral QHS   Continuous Infusions: PRN Meds:.metoCLOPramide **OR** metoCLOPramide (REGLAN) injection, ondansetron **OR** ondansetron (ZOFRAN) IV Allergies  Allergen Reactions   Ace Inhibitors Anaphylaxis and Other (See Comments)    Swelling of the tongue and throat    Cleocin [Clindamycin] Anaphylaxis and Swelling   Enalapril Maleate Anaphylaxis and Other (See Comments)    Swelling of the tongue and throat    Zestril [Lisinopril] Anaphylaxis and Other (See Comments)    Swelling of the tongue and throat     OBJECTIVE: Blood pressure (!) 144/75, pulse 75, temperature 97.6 F (36.4 C), temperature source Oral, resp. rate 18, height 5\' 9"  (1.753 m), weight 99.8 kg, last menstrual period 09/13/2019, SpO2 99 %.  Physical Exam Constitutional:      Appearance: Normal appearance.  HENT:     Head: Normocephalic and atraumatic.     Right Ear: Tympanic membrane normal.     Left Ear: Tympanic membrane normal.     Nose: Nose normal.     Mouth/Throat:     Mouth: Mucous membranes are moist.  Eyes:     Extraocular Movements: Extraocular movements intact.     Conjunctiva/sclera: Conjunctivae normal.     Pupils: Pupils are equal, round, and reactive to light.  Cardiovascular:     Rate and Rhythm: Normal rate and regular rhythm.     Heart sounds: No murmur heard.    No friction rub. No gallop.  Pulmonary:     Effort: Pulmonary effort is normal.     Breath sounds: Normal breath sounds.  Abdominal:     General: Abdomen is flat.     Palpations: Abdomen is soft.  Skin:    General: Skin is warm and dry.  Neurological:     General: No focal deficit present.     Mental Status: She is alert and oriented to person, place, and time.  Psychiatric:        Mood and Affect: Mood normal.      Lab Results Lab Results  Component Value Date   WBC 14.7 (  H) 03/11/2023   HGB 9.7 (L) 03/11/2023   HCT 30.4 (L) 03/11/2023   MCV 82.4 03/11/2023   PLT 287 03/11/2023    Lab Results  Component Value Date   CREATININE 1.16 (H) 03/11/2023   BUN 10 03/11/2023   NA 129 (L) 03/11/2023   K 4.1 03/11/2023   CL 92 (L) 03/11/2023   CO2 26 03/11/2023    Lab Results  Component Value Date   ALT 12 09/12/2022   AST 15 09/12/2022   ALKPHOS 126 09/12/2022   BILITOT 0.5 09/12/2022       Laurice Record, [place] for Infectious Disease [facility] 03/11/2023, 12:45 PM

## 2023-03-14 ENCOUNTER — Telehealth: Payer: Self-pay

## 2023-03-14 NOTE — Transitions of Care (Post Inpatient/ED Visit) (Signed)
   03/14/2023  Name: ELLICE Jacobs MRN: [id number] DOB: 1966-03-05  Today's TOC FU Call Status: Today's TOC FU Call Status:: Successful TOC FU Call Competed TOC FU Call Complete Date: 03/14/23  Transition Care Management Follow-up Telephone Call Date of Discharge: 03/11/23 Discharge Facility: [facility]) Type of Discharge: Inpatient Admission Primary Inpatient Discharge Diagnosis:: abscess right foot How have you been since you were released from the hospital?: Better Any questions or concerns?: No  Items Reviewed: Did you receive and understand the discharge instructions provided?: Yes Medications obtained and verified?: Yes (Medications Reviewed) Any new allergies since your discharge?: No Dietary orders reviewed?: Yes Do you have support at home?: Yes Name of Support/Comfort Primary Source: caregiver  Home Care and Equipment/Supplies: [place] Ordered?: Yes Name of [place]:: [place] Has Agency set up a time to come to your home?: Yes [place] Visit Date: 03/14/23 Any new equipment or medical supplies ordered?: NA  Functional Questionnaire: Do you need assistance with meal preparation?: No Do you need assistance with eating?: No Do you have difficulty maintaining continence: No Do you need assistance with getting out of bed/getting out of a chair/moving?: Yes Do you have difficulty managing or taking your medications?: No  Follow up appointments reviewed: PCP Follow-up appointment confirmed?: No (no avail appt times, sent message to staff to schedule) MD Provider Line [phone] Given: No [facility] Follow-up appointment confirmed?: No Reason Specialist Follow-Up Not Confirmed: Patient has Specialist Provider Number and will Call for Appointment Do you need transportation to your follow-up appointment?: No Do you understand care options if your condition(s) worsen?: Yes-patient verbalized  understanding    [place], [place] Nurse Health Advisor Direct Dial [phone]

## 2023-03-16 ENCOUNTER — Telehealth: Payer: Self-pay

## 2023-03-16 ENCOUNTER — Other Ambulatory Visit (HOSPITAL_COMMUNITY): Payer: Self-pay | Admitting: Physician Assistant

## 2023-03-16 DIAGNOSIS — F331 Major depressive disorder, recurrent, moderate: Secondary | ICD-10-CM

## 2023-03-16 DIAGNOSIS — F411 Generalized anxiety disorder: Secondary | ICD-10-CM

## 2023-03-16 DIAGNOSIS — F4310 Post-traumatic stress disorder, unspecified: Secondary | ICD-10-CM

## 2023-03-16 NOTE — Telephone Encounter (Signed)
Rn from [facility] called to info PC that they were not able to reach pt until today so her care will be start until 4/5 this coming Friday

## 2023-03-16 NOTE — Patient Outreach (Signed)
  Care Coordination   Documentation  Visit Note   03/16/2023 Name: [name] MRN: [id number] DOB: 12/03/1966  [name] is a 57 y.o. year old female who sees [place], Rayann N, DO for primary care. Patient was contacted during this encounter.  What matters to the patients Jacobs and wellness today?  I received a message at [facility] requesting help for a patient who was discharged from the hospital on March 29th, 2024. The patient has an order for [facility] but has not heard from them yet. I spoke with [name] at [phone], who mentioned that she had tried to contact the patient but could not get through. I informed her that the patient required assistance with her right foot. Later, I called the office and spoke with [name], who had [name] on the phone, and provided her with the number to [name]. I asked her to call them immediately to make necessary arrangements.      SDOH assessments and interventions completed:  No     Care Coordination Interventions:  Yes, provided    Interventions Today    Flowsheet Row Most Recent Value  General Interventions   General Interventions Discussed/Reviewed Communication with  Communication with --  [name]]         Follow up plan: No further intervention required.   Encounter Outcome:  Pt. Visit Completed   Caitlyn Arms RN, BSN, [facility]   Phone: [phone]

## 2023-03-17 ENCOUNTER — Telehealth: Payer: Self-pay | Admitting: *Deleted

## 2023-03-17 NOTE — Telephone Encounter (Signed)
It was just  a FYI  .[name] They just called to let you know  that the care  couldn't start right away .... Also she had called earlier and I put her to chilon  about no one had call her and her foot was now bleeding .... She chilon contacted them and had her an three way call

## 2023-03-17 NOTE — Telephone Encounter (Signed)
I called Ighos RN with [facility] who saw pt today for the first time. She stated right foot wound slightly open, yellow in color with sloughing; stated may need to be debrided. Foot is swollen. No bleeding noted. She's requesting verbal order to use "Calcium Alginate twice a week x 8 weeks with 2 PRN's". With ABD pads and wrap w/Kerlix. VO given - sending to red team for approval or denial. Thanks

## 2023-03-18 ENCOUNTER — Telehealth: Payer: Self-pay | Admitting: *Deleted

## 2023-03-18 NOTE — Telephone Encounter (Signed)
Patient called in stating she had 2 recent surgeries on right foot wound. She was d/c on 3/29 and did not have HH RN come to change dressing till yesterday. She states there is a lot of dead skin that needs to be removed. She is concerned she will lose her leg. Today she is having chills and CP. She is advised to call 911 now for transport back to [facility] ED. States she will call them now.

## 2023-03-21 ENCOUNTER — Encounter: Payer: PRIVATE HEALTH INSURANCE | Admitting: Student

## 2023-03-23 ENCOUNTER — Ambulatory Visit (INDEPENDENT_AMBULATORY_CARE_PROVIDER_SITE_OTHER): Payer: Medicaid Other | Admitting: Student

## 2023-03-23 ENCOUNTER — Other Ambulatory Visit: Payer: Self-pay

## 2023-03-23 ENCOUNTER — Encounter: Payer: Self-pay | Admitting: Infectious Diseases

## 2023-03-23 ENCOUNTER — Ambulatory Visit: Payer: Medicaid Other | Admitting: Infectious Diseases

## 2023-03-23 VITALS — BP 158/84 | HR 71 | Temp 97.7°F | Ht 69.0 in | Wt 236.6 lb

## 2023-03-23 VITALS — BP 163/81 | HR 71 | Temp 97.7°F | Ht 69.0 in | Wt 236.9 lb

## 2023-03-23 DIAGNOSIS — E1165 Type 2 diabetes mellitus with hyperglycemia: Secondary | ICD-10-CM

## 2023-03-23 DIAGNOSIS — M5416 Radiculopathy, lumbar region: Secondary | ICD-10-CM | POA: Diagnosis not present

## 2023-03-23 DIAGNOSIS — E11628 Type 2 diabetes mellitus with other skin complications: Secondary | ICD-10-CM

## 2023-03-23 DIAGNOSIS — L089 Local infection of the skin and subcutaneous tissue, unspecified: Secondary | ICD-10-CM | POA: Diagnosis not present

## 2023-03-23 DIAGNOSIS — Z95810 Presence of automatic (implantable) cardiac defibrillator: Secondary | ICD-10-CM

## 2023-03-23 DIAGNOSIS — I4891 Unspecified atrial fibrillation: Secondary | ICD-10-CM | POA: Diagnosis not present

## 2023-03-23 DIAGNOSIS — E1140 Type 2 diabetes mellitus with diabetic neuropathy, unspecified: Secondary | ICD-10-CM | POA: Diagnosis present

## 2023-03-23 DIAGNOSIS — I69354 Hemiplegia and hemiparesis following cerebral infarction affecting left non-dominant side: Secondary | ICD-10-CM | POA: Diagnosis not present

## 2023-03-23 DIAGNOSIS — N179 Acute kidney failure, unspecified: Secondary | ICD-10-CM | POA: Diagnosis not present

## 2023-03-23 DIAGNOSIS — I5042 Chronic combined systolic (congestive) and diastolic (congestive) heart failure: Secondary | ICD-10-CM

## 2023-03-23 DIAGNOSIS — Z794 Long term (current) use of insulin: Secondary | ICD-10-CM

## 2023-03-23 DIAGNOSIS — I10 Essential (primary) hypertension: Secondary | ICD-10-CM

## 2023-03-23 MED ORDER — FREESTYLE LIBRE 3 READER DEVI: 1.0000 | 1 refills | Status: DC

## 2023-03-23 MED ORDER — FREESTYLE LIBRE 3 SENSOR MISC: 11 refills | Status: DC

## 2023-03-23 MED ORDER — FREESTYLE LIBRE 2 READER DEVI: 1 refills | Status: DC

## 2023-03-23 NOTE — Patient Instructions (Addendum)
[name],  It was a pleasure seeing you in the clinic today.   I have ordered a Freestyle Libre sensor and receiver for you to check your blood sugar levels continuously as we discussed. I have also referred to see [name] for diabetes counseling. You saw Dr. [name] today as well. As we discussed, he will be in touch with Dr. [name] (orthopedic) to decide how to manage your foot wound. I have also placed a referral to the wound clinic to help take care of your foot wound. Please come back in 1 month for a follow up visit.  Please call our clinic at [phone] if you have any questions or concerns. The best time to call is Monday-Friday from 9am-4pm, but there is someone available 24/7 at the same number. If you need medication refills, please notify your pharmacy one week in advance and they will send [place] a request.   Thank you for letting us take part in your care. We look forward to seeing you next time!

## 2023-03-23 NOTE — Addendum Note (Signed)
Addended by: Merrilyn Puma on: 03/23/2023 04:48 PM   Modules accepted: Orders

## 2023-03-23 NOTE — Assessment & Plan Note (Signed)
I am concerned about her wound.  I've spoken with Dr [name] and she needs f/u with Dr Lajoyce Corners.  She will continue with doxy until she sees him and has further debridement, possible amputation.  I have reached out to Dr Lajoyce Corners to see if she can be see expeditiously.  She will return to see me in 6 days or 2 weeks after hospital.

## 2023-03-23 NOTE — Assessment & Plan Note (Signed)
Patient noted to have AKI on day of discharge. Cr. 1.13 up from baseline of ~0.8. Thought to be prerenal. Will repeat a BMP today to ensure AKI has resolved.  -f/u BMP  ADDENDUM: AKI resolved on repeat BMP. Will resolve this issue.

## 2023-03-23 NOTE — Assessment & Plan Note (Signed)
Meets with diabetes educator today on monitoring and insulin delivery.

## 2023-03-23 NOTE — Assessment & Plan Note (Signed)
Currently on toprol-xl 100mg  daily, spironolactone 100mg  daily, imdur 90mg  daily, hydralazine 25mg  TID, and norvasc 10mg  daily. BP is elevated today at 158/84. Given acute f/u for diabetic foot wound, unable to effectively address HTN. Will schedule follow up in 1 month to f/u HTN and adjust medications as needed.  Plan: -f/u in 1 month -continue current regimen at this time

## 2023-03-23 NOTE — Assessment & Plan Note (Addendum)
Patient with poorly controlled T2DM. She is currently on trulicity 0.75mg  weekly and lantus 30u qhs. Recent A1c was 10.4% 2 weeks ago during hospitalization for diabetic foot wound. She is having trouble checking blood sugars at home due to hemiparesis affecting her L side and neuropathy affecting her R arm. She has been taking insulin although without checking her CBGs at home due to the above issues. She is asking if she can have a nurse help with medications at home which is reasonable. [name] was able to see her briefly and explained V-Go although patient refused at this time as it is too complicated for her to learn how to use right away. We were able to reach an agreement to try Freestyle Libre 3 sensor and receiver to help monitor sugar levels at home given uncontrolled diabetes.  Plan: -continue lantus 30u qhs -continue trulicity 0.75mg  weekly -referral to [facility] for [facility] RN to help with medications -referral to [name] for diabetes education/counseling -Freestyle Libre 3 sensor and receiver ordered - may need prior auth -needs repeat A1c in 10 weeks -f/u in 1 month

## 2023-03-23 NOTE — Progress Notes (Signed)
Subjective:    Patient ID: [name], female  DOB: 06-Dec-1966, 57 y.o.        MRN: [id number]   HPI 57 y.o. F with history of diabetes prior CVA, combined systolic diastolic heart failure, hyperlipidemia, hypertension admitted with right fifth metatarsal osteomyelitis.  Who presented to the ED with chest pain 03-05-23.  She had right foot pain for about a week prior to presentation as well.  She was noted to have erythema tracking up her leg on admission but was unaware of this.  Reported wound in the same area about 6 years ago.   She is here in Mining engineer wheelchair. On arrival to the ED she had a temp of 101.6, WBC 16 K.  Started on vancomycin, pip-tazo, linezolid then transition to pip-tazo and linezolid.  CT showed soft tissue gas with possible small focus of bone erosion of the fifth metatarsal head concerning for gas-forming infection/osteomyelitis.  No drainable fluid collection.  Radiopaque 3 mm fragment plantar skin wound.  I&D was done by ED physician, no cultures.  Orthopedics consulted, patient underwent I&D on 3/24 purulent fluid evacuated from plantar surface, noted debridement necrotic bone around fifth metatarsal head.  Cultures grew staph lugdunensis (pan-sens) and S warnerii (R-ox).  Patient taken back to the OR on 3/28. She had vascular studies done that were normal.  She was d/c home on 3-29 on po doxy planned for 6 weeks. She did not have ESR or CRP.  Her A1C was 10.6% in hospital.  Today she has concerns that her stitches have come open. There has been small amount of brownish discharge from her wound. No bloody d/c or cloudy. She has a nurse from [facility] who has been doing dressing changes and told her that it looks like it has infection in it.  She has not f/u with Dr Lajoyce Corners due to concerns that she will need an amputation.  She has had no further f/c.  She has had no problems taking her doxy. Today is day 18 of anbx   Health Maintenance  Topic Date Due   COVID-19  Vaccine (1) Never done   Zoster Vaccines- Shingrix (1 of 2) Never done   PAP SMEAR-Modifier  Never done   COLONOSCOPY (Pts 45-92yrs Insurance coverage will need to be confirmed)  Never done   MAMMOGRAM  08/28/2016   OPHTHALMOLOGY EXAM  09/22/2022   HEMOGLOBIN A1C  06/06/2023   INFLUENZA VACCINE  07/14/2023   Diabetic kidney evaluation - Urine ACR  11/19/2023   LIPID PANEL  11/19/2023   Diabetic kidney evaluation - eGFR measurement  03/10/2024   FOOT EXAM  03/22/2024   DTaP/Tdap/Td (2 - Td or Tdap) 10/01/2029   Hepatitis C Screening  Completed   HIV Screening  Completed   HPV VACCINES  Aged Out      Review of Systems  Constitutional:  Negative for chills and fever.  Eyes:  Positive for blurred vision.  Gastrointestinal:  Negative for constipation and diarrhea.  Genitourinary:  Negative for dysuria.  Neurological:  Positive for sensory change.    Please see HPI. All other systems reviewed and negative.     Objective:  Physical Exam Vitals reviewed.  Constitutional:      Appearance: Normal appearance. She is obese.  HENT:     Mouth/Throat:     Mouth: Mucous membranes are moist.     Pharynx: No oropharyngeal exudate.  Eyes:     Extraocular Movements: Extraocular movements intact.     Pupils:  Pupils are equal, round, and reactive to light.  Cardiovascular:     Rate and Rhythm: Normal rate and regular rhythm.  Pulmonary:     Effort: Pulmonary effort is normal.     Breath sounds: Normal breath sounds.  Abdominal:     General: Bowel sounds are normal. There is no distension.     Palpations: Abdomen is soft.     Tenderness: There is no abdominal tenderness.  Musculoskeletal:     Cervical back: Normal range of motion and neck supple.     Right lower leg: Edema present.     Left lower leg: Edema present.       Feet:  Feet:     Right foot:     Skin integrity: Skin breakdown present.     Comments: Open wound,no d/c. No increase in heat. Non-tender. 3+ doraslis pedis  pulse.  Neurological:     Mental Status: She is alert.    There is a opening in distal her foot (~ 8mm) which connects to her larger wound.         Assessment & Plan:

## 2023-03-23 NOTE — Progress Notes (Signed)
CC: hospital f/u visit for diabetic food wound  HPI:  [name] is a 57 y.o. female with history listed below presenting to the [facility] for hospital f/u of diabetic foot wound. Please see individualized problem based charting for full HPI.  Past Medical History:  Diagnosis Date   Acute cystitis 06/19/2021   AKI (acute kidney injury) (HCC) 09/13/2022   Allergic rhinitis    Arthritis    "hips, back, legs, arms" (07/04/2014)   Asthma    hx   Automatic implantable cardioverter-defibrillator in situ    Calcifying tendinitis of shoulder    Chronic combined systolic and diastolic CHF (congestive heart failure) (HCC)    EF 40-45% by echo 12/06/2012   Chronic diastolic heart failure (HCC)     Primarily diastolic CHF: Likely due to uncontrolled HTN. Last echo (8/12) with EF 45-50%, mild to moderate LVH with some asymmetric septal hypertrophy, RV normal size and systolic function. EF 50-55% by LV-gram in 6/12.    Chronic lower back pain    secondary to DJD, obsetiy, hip problems. Followed by Dr. Ivory Broad (pain management)   Chronic systolic heart failure (HCC) 05/17/2022   Chronic use of opiate for therapeutic purpose 12/14/2016   Contact with and (suspected) exposure to covid-19 02/07/2020   Coronary artery disease    questionable. LHC 05/2011 showing normal coronaries // Followed at [facility], Dr. Shirlee Latch   Degeneration of lumbar or lumbosacral intervertebral disc    DJD (degenerative joint disease) of hip    right sided   Fatigue 04/27/2022   Frequent UTI    GERD (gastroesophageal reflux disease)    HLD (hyperlipidemia)    Hypertension    Poorly controlled. Has had HTN since age 51. Angioedema with ACEI.  24 Hr urine and renal arterial dopplers ordered . . . Never done   LBBB (left bundle branch block)    Left shoulder pain 06/30/2015   Left spastic hemiparesis (HCC) 09/23/2015   Liver disease    Lumbago 11/22/2016   Morbid obesity (HCC)    Muscle spasm  06/19/2021   Need for pneumococcal vaccine 09/09/2017   NICM (nonischemic cardiomyopathy) (HCC)    EF 45-50% in 8/12, cath 6/12 showed normal coronaries, EF 50-55% by LV gram   OSA on CPAP    sleep study in 8/12 showed moderate to severe OSA requiring CPAP   Perimenopausal 03/28/2017   Polyneuropathy in diabetes(357.2)    Presence of permanent cardiac pacemaker    Rash 07/14/2016   Seizures (HCC)    last 3 months   Shortness of breath    none now   Stroke [facility]) 12/2013   "my left side is paralyzed" (07/04/2014)   Thoracic or lumbosacral neuritis or radiculitis, unspecified    Type II diabetes mellitus (HCC) DX: 2002   Urinary frequency 11/05/2014   Vaginal discharge 02/05/2021    Review of Systems:  Negative aside from that listed in individualized problem based charting.  Physical Exam:  Vitals:   03/23/23 1435  BP: (!) 158/84  Pulse: 71  Temp: 97.7 F (36.5 C)  TempSrc: Oral  SpO2: 100%  Weight: 236 lb 9.6 oz (107.3 kg)  Height: 5\' 9"  (1.753 m)   Physical Exam Constitutional:      Appearance: She is obese.     Comments: Chronically ill-appearing  HENT:     Mouth/Throat:     Mouth: Mucous membranes are moist.     Pharynx: Oropharynx is clear. No oropharyngeal exudate.  Cardiovascular:  Rate and Rhythm: Normal rate and regular rhythm.     Pulses: Normal pulses.     Heart sounds: Normal heart sounds. No murmur heard.    No friction rub. No gallop.  Pulmonary:     Effort: Pulmonary effort is normal.     Breath sounds: Normal breath sounds. No wheezing, rhonchi or rales.  Abdominal:     General: Bowel sounds are normal. There is no distension.     Palpations: Abdomen is soft.     Tenderness: There is no abdominal tenderness. There is no guarding or rebound.  Skin:    Comments: Wound breakdown noted on R lateral foot. Sutures in place but not completely fixed. No drainage or purulence noted. No increased warmth, redness, or tenderness.  Neurological:      Mental Status: She is alert and oriented to person, place, and time. Mental status is at baseline.  Psychiatric:        Mood and Affect: Mood normal.        Behavior: Behavior normal.      Assessment & Plan:   See Encounters Tab for problem based charting.  Patient discussed with Dr. Criselda Peaches

## 2023-03-23 NOTE — Assessment & Plan Note (Signed)
Patient recently hospitalized for diabetic foot infection of R foot with osteomyelitis. She underwent 2 washouts with orthopedic surgery and ID was consulted, who recommended a 6-week course of doxycycline 100mg  BID.   Seen in clinic today alongside Dr. [name]. Her foot wound is concerning and her sutures are not completely fixed. Discussed with Dr. [name], plan is to continue doxycycline at this time. He has reached out to Dr. Lajoyce Corners for expedited evaluation of foot wound with need for possible further debridement and possible amputation. She is adamant about not wanting amputation but I have counseled her to follow up with orthopedic surgery for further treatment given extent of foot wound. Will have ID follow up in 6 days, or in 2 weeks (if hospitalization required).

## 2023-03-24 ENCOUNTER — Telehealth: Payer: Self-pay

## 2023-03-24 ENCOUNTER — Encounter: Payer: Self-pay | Admitting: Student

## 2023-03-24 ENCOUNTER — Inpatient Hospital Stay: Payer: PRIVATE HEALTH INSURANCE | Admitting: Internal Medicine

## 2023-03-24 LAB — BMP8+ANION GAP
Anion Gap: 16 mmol/L (ref 10.0–18.0)
BUN/Creatinine Ratio: 9 (ref 9–23)
BUN: 7 mg/dL (ref 6–24)
CO2: 22 mmol/L (ref 20–29)
Calcium: 9.6 mg/dL (ref 8.7–10.2)
Chloride: 101 mmol/L (ref 96–106)
Creatinine, Ser: 0.75 mg/dL (ref 0.57–1.00)
Glucose: 97 mg/dL (ref 70–99)
Potassium: 3.8 mmol/L (ref 3.5–5.2)
Sodium: 139 mmol/L (ref 134–144)
eGFR: 93 mL/min/{1.73_m2} (ref >59)

## 2023-03-24 NOTE — Telephone Encounter (Signed)
I have tried to contact this pt for an appt today. Can you try again please and if not today then it would have to be Monday.

## 2023-03-24 NOTE — Progress Notes (Signed)
Internal Medicine Clinic Attending  Case discussed with Dr. Jinwala at the time of the visit.  We reviewed the resident's history and exam and pertinent patient test results.  I agree with the assessment, diagnosis, and plan of care documented in the resident's note.  

## 2023-03-24 NOTE — Telephone Encounter (Signed)
Prior Authorization for patient [facility] [name] 3 Reader) came through from [employer] and spoke with Tresa Endo with [facility] , prior authorization has been submitted please allow 24 hours for a approval or denial. [id number] Interaction [id number]

## 2023-03-24 NOTE — Telephone Encounter (Signed)
I called pt per Dr. Lajoyce Corners to work in for an appt today. Referral from [facility]. I called both numbers listed for the pt and was able to leave a vm on the home #. Asked for a return call so that we can work her into the sch today. Will hold this message and try again later on this morning.

## 2023-03-27 ENCOUNTER — Other Ambulatory Visit: Payer: Self-pay | Admitting: Internal Medicine

## 2023-03-27 DIAGNOSIS — G40209 Localization-related (focal) (partial) symptomatic epilepsy and epileptic syndromes with complex partial seizures, not intractable, without status epilepticus: Secondary | ICD-10-CM

## 2023-03-27 DIAGNOSIS — I10 Essential (primary) hypertension: Secondary | ICD-10-CM

## 2023-03-28 ENCOUNTER — Ambulatory Visit: Payer: Medicaid Other | Admitting: Orthopedic Surgery

## 2023-03-30 ENCOUNTER — Other Ambulatory Visit: Payer: Self-pay | Admitting: Internal Medicine

## 2023-03-30 ENCOUNTER — Other Ambulatory Visit: Payer: Self-pay | Admitting: Student

## 2023-03-30 NOTE — Telephone Encounter (Signed)
Decision:Approved effective 03/24/23-09/20/23

## 2023-04-01 ENCOUNTER — Other Ambulatory Visit: Payer: Self-pay | Admitting: Internal Medicine

## 2023-04-01 DIAGNOSIS — I4891 Unspecified atrial fibrillation: Secondary | ICD-10-CM

## 2023-04-05 ENCOUNTER — Ambulatory Visit (INDEPENDENT_AMBULATORY_CARE_PROVIDER_SITE_OTHER): Payer: Medicaid Other

## 2023-04-05 ENCOUNTER — Telehealth: Payer: Self-pay | Admitting: Internal Medicine

## 2023-04-05 DIAGNOSIS — I428 Other cardiomyopathies: Secondary | ICD-10-CM

## 2023-04-05 NOTE — Telephone Encounter (Signed)
OT calling from [facility] [phone] requesting VO. Pls call back.

## 2023-04-06 ENCOUNTER — Inpatient Hospital Stay: Payer: Medicaid Other | Admitting: Internal Medicine

## 2023-04-06 LAB — CUP PACEART REMOTE DEVICE CHECK
Battery Remaining Longevity: 67 mo
Battery Voltage: 2.98 V
Brady Statistic AP VP Percent: 0.02 %
Brady Statistic AP VS Percent: 0.01 %
Brady Statistic AS VP Percent: 98.34 %
Brady Statistic AS VS Percent: 1.63 %
Brady Statistic RA Percent Paced: 0.03 %
Brady Statistic RV Percent Paced: 15.69 %
Date Time Interrogation Session: 20240423103826
HighPow Impedance: 58 Ohm
Implantable Lead Connection Status: 753985
Implantable Lead Connection Status: 753985
Implantable Lead Connection Status: 753985
Implantable Lead Implant Date: 20140910
Implantable Lead Implant Date: 20140910
Implantable Lead Implant Date: 20140910
Implantable Lead Location: 753858
Implantable Lead Location: 753859
Implantable Lead Location: 753860
Implantable Lead Model: 4298
Implantable Lead Model: 5076
Implantable Lead Model: 6935
Implantable Pulse Generator Implant Date: 20220406
Lead Channel Impedance Value: 194.634
Lead Channel Impedance Value: 203.256
Lead Channel Impedance Value: 207.273
Lead Channel Impedance Value: 208.568
Lead Channel Impedance Value: 223.149
Lead Channel Impedance Value: 380 Ohm
Lead Channel Impedance Value: 399 Ohm
Lead Channel Impedance Value: 399 Ohm
Lead Channel Impedance Value: 399 Ohm
Lead Channel Impedance Value: 437 Ohm
Lead Channel Impedance Value: 456 Ohm
Lead Channel Impedance Value: 456 Ohm
Lead Channel Impedance Value: 513 Ohm
Lead Channel Impedance Value: 703 Ohm
Lead Channel Impedance Value: 722 Ohm
Lead Channel Impedance Value: 722 Ohm
Lead Channel Impedance Value: 760 Ohm
Lead Channel Impedance Value: 760 Ohm
Lead Channel Pacing Threshold Amplitude: 0.5 V
Lead Channel Pacing Threshold Amplitude: 0.625 V
Lead Channel Pacing Threshold Amplitude: 1.125 V
Lead Channel Pacing Threshold Pulse Width: 0.4 ms
Lead Channel Pacing Threshold Pulse Width: 0.4 ms
Lead Channel Pacing Threshold Pulse Width: 0.4 ms
Lead Channel Sensing Intrinsic Amplitude: 3.625 mV
Lead Channel Sensing Intrinsic Amplitude: 3.625 mV
Lead Channel Sensing Intrinsic Amplitude: 9.625 mV
Lead Channel Sensing Intrinsic Amplitude: 9.625 mV
Lead Channel Setting Pacing Amplitude: 1.5 V
Lead Channel Setting Pacing Amplitude: 1.75 V
Lead Channel Setting Pacing Amplitude: 2 V
Lead Channel Setting Pacing Pulse Width: 0.4 ms
Lead Channel Setting Pacing Pulse Width: 0.4 ms
Lead Channel Setting Sensing Sensitivity: 0.3 mV
Zone Setting Status: 755011

## 2023-04-06 NOTE — Telephone Encounter (Signed)
Returned call to [place], OT with [facility]. Requesting VO for [facility] OT to work on ADLs, IADLs, functional transfers, mobility, home safety, home exercise program, and fall prevention. Verbal auth given. Will route to Red Team for agreement/denial.

## 2023-04-12 ENCOUNTER — Encounter (HOSPITAL_COMMUNITY): Payer: Self-pay

## 2023-04-12 ENCOUNTER — Emergency Department (HOSPITAL_COMMUNITY)
Admission: EM | Admit: 2023-04-12 | Discharge: 2023-04-12 | Disposition: A | Discharge status: Home / Self Care | Payer: Medicaid Other | Attending: Emergency Medicine | Admitting: Emergency Medicine

## 2023-04-12 ENCOUNTER — Emergency Department (HOSPITAL_COMMUNITY): Payer: Medicaid Other

## 2023-04-12 DIAGNOSIS — M86171 Other acute osteomyelitis, right ankle and foot: Secondary | ICD-10-CM | POA: Insufficient documentation

## 2023-04-12 DIAGNOSIS — Z794 Long term (current) use of insulin: Secondary | ICD-10-CM | POA: Diagnosis not present

## 2023-04-12 DIAGNOSIS — L089 Local infection of the skin and subcutaneous tissue, unspecified: Secondary | ICD-10-CM | POA: Diagnosis present

## 2023-04-12 DIAGNOSIS — E11628 Type 2 diabetes mellitus with other skin complications: Secondary | ICD-10-CM | POA: Diagnosis not present

## 2023-04-12 DIAGNOSIS — Z7901 Long term (current) use of anticoagulants: Secondary | ICD-10-CM | POA: Insufficient documentation

## 2023-04-12 DIAGNOSIS — M869 Osteomyelitis, unspecified: Secondary | ICD-10-CM

## 2023-04-12 LAB — COMPREHENSIVE METABOLIC PANEL
ALT: 13 U/L (ref 0–44)
AST: 16 U/L (ref 15–41)
Albumin: 3.7 g/dL (ref 3.5–5.0)
Alkaline Phosphatase: 93 U/L (ref 38–126)
Anion gap: 8 (ref 5–15)
BUN: 12 mg/dL (ref 6–20)
CO2: 26 mmol/L (ref 22–32)
Calcium: 8.9 mg/dL (ref 8.9–10.3)
Chloride: 100 mmol/L (ref 98–111)
Creatinine, Ser: 1 mg/dL (ref 0.44–1.00)
GFR, Estimated: >60 mL/min (ref >60)
Glucose, Bld: 263 mg/dL — ABNORMAL HIGH (ref 70–99)
Potassium: 4 mmol/L (ref 3.5–5.1)
Sodium: 134 mmol/L — ABNORMAL LOW (ref 135–145)
Total Bilirubin: 0.8 mg/dL (ref 0.3–1.2)
Total Protein: 7.9 g/dL (ref 6.5–8.1)

## 2023-04-12 LAB — CBC WITH DIFFERENTIAL/PLATELET
Abs Immature Granulocytes: 0.01 10*3/uL (ref 0.00–0.07)
Basophils Absolute: 0 10*3/uL (ref 0.0–0.1)
Basophils Relative: 0 %
Eosinophils Absolute: 0.1 10*3/uL (ref 0.0–0.5)
Eosinophils Relative: 2 %
HCT: 34.6 % — ABNORMAL LOW (ref 36.0–46.0)
Hemoglobin: 10.8 g/dL — ABNORMAL LOW (ref 12.0–15.0)
Immature Granulocytes: 0 %
Lymphocytes Relative: 29 %
Lymphs Abs: 2.6 10*3/uL (ref 0.7–4.0)
MCH: 26.9 pg (ref 26.0–34.0)
MCHC: 31.2 g/dL (ref 30.0–36.0)
MCV: 86.3 fL (ref 80.0–100.0)
Monocytes Absolute: 0.7 10*3/uL (ref 0.1–1.0)
Monocytes Relative: 7 %
Neutro Abs: 5.5 10*3/uL (ref 1.7–7.7)
Neutrophils Relative %: 62 %
Platelets: 184 10*3/uL (ref 150–400)
RBC: 4.01 MIL/uL (ref 3.87–5.11)
RDW: 13.2 % (ref 11.5–15.5)
WBC: 9 10*3/uL (ref 4.0–10.5)
nRBC: 0 % (ref 0.0–0.2)

## 2023-04-12 LAB — LACTIC ACID, PLASMA: Lactic Acid, Venous: 1.5 mmol/L (ref 0.5–1.9)

## 2023-04-12 LAB — CBG MONITORING, ED: Glucose-Capillary: 250 mg/dL — ABNORMAL HIGH (ref 70–99)

## 2023-04-12 NOTE — ED Triage Notes (Signed)
BIB EMS. HHN sent to ED for R foot bone infection. It has been there one month, odor and drainage noted. Per [facility] needs amputation but client non compliant with physician orders or antibiotic. HHN has been doing weekly dressing changes.

## 2023-04-12 NOTE — Discharge Instructions (Addendum)
Reschedule your appointment with Dr. Lajoyce Corners.  Continue your current antibiotics.  Return to the emergency department for any new or worsening symptoms of concern.

## 2023-04-12 NOTE — ED Provider Notes (Signed)
[place] EMERGENCY DEPARTMENT AT [facility] Provider Note   CSN: [id number] Arrival date & time: 04/12/23  1015     History  Chief Complaint  Patient presents with   Wound Infection    [name] is a 57 y.o. female.  HPI Patient presents for wound.  Medical history includes OSA, HLD, cardiomyopathy, ICD placement, CVA with left hemiparesis, T2DM, obesity, atrial fibrillation, HTN, CHF, seizures, neuropathy, arthritis.  She was hospitalized 1 month ago with a diabetic right foot infection and osteomyelitis.  She underwent 2 washouts with orthopedic surgery.  Infectious disease recommended 6 weeks of doxycycline.  She has been taking her doxycycline twice a day as prescribed.  Her last dose was last night.  She has not yet followed up with orthopedic surgery.  She did follow-up with her PCP and infectious disease doctor.  She undergoes dressing changes twice a week by home health nurse.  She denies any recent systemic symptoms.  She has not had any worsened pain to the area.  She has been able to ambulate.  She noticed a foul smell starting 4 days ago.  Home health nurse was concerned of the same when she came to check on her today.  For this reason, patient presents to the ED.    Home Medications Prior to Admission medications   Medication Sig Start Date End Date Taking? Authorizing Provider  Accu-Chek FastClix Lancets MISC USE TO CHECK BLOOD SUGAR TWICE DAILY 03/29/23   Atway, Rayann N, DO  ACCU-CHEK GUIDE test strip USE TO CHECK BLOOD SUGAR TWICE DAILY 03/29/23   Atway, Rayann N, DO  amLODipine (NORVASC) 10 MG tablet TAKE ONE TABLET BY MOUTH EVERY DAY 03/29/23   Atway, Rayann N, DO  atorvastatin (LIPITOR) 40 MG tablet Take 1 tablet (40 mg total) by mouth daily. 11/18/22   Atway, Derwood Kaplan, DO  Blood Glucose Monitoring Suppl (ACCU-CHEK GUIDE) w/Device KIT Use As Directed 09/15/22   Marolyn Haller, MD  busPIRone (BUSPAR) 7.5 MG tablet Take 1 tablet (7.5 mg total) by  mouth 2 (two) times daily. 01/20/23 01/20/24  Nwoko, Stephens Shire E, PA  COMFORT EZ PEN NEEDLES 31G X 8 MM MISC USE TO INJECT INSULIN IN THE MORNING, NOON, EVENING, AND BEDTIME 03/29/23   Atway, Rayann N, DO  Continuous Blood Gluc Receiver (FREESTYLE LIBRE 3 READER) DEVI 1 Device by Does not apply route continuous. Monitor sugars continuously 03/23/23   Merrilyn Puma, MD  Continuous Blood Gluc Sensor (FREESTYLE LIBRE 3 SENSOR) MISC Place new sensor every 14 days. Monitor sugars continuously. 03/23/23   Merrilyn Puma, MD  cyanocobalamin 1000 MCG tablet Take 1 tablet by mouth daily. 02/06/19   [provider]  cyclobenzaprine (FLEXERIL) 5 MG tablet TAKE ONE TABLET BY MOUTH THREE TIMES DAILY AS NEEDED FOR MUSCLE SPASMS Patient taking differently: Take 5 mg by mouth 3 (three) times daily as needed for muscle spasms. 09/02/22   Merrilyn Puma, MD  diclofenac Sodium (VOLTAREN) 1 % GEL APPLY 4 GRAMS TOPICALLY TO LEFT KNEE FOUR TIMES DAILY. Patient taking differently: Apply 4 g topically 4 (four) times daily as needed (Pain). Left knee 01/12/22   Merrilyn Puma, MD  doxycycline (VIBRA-TABS) 100 MG tablet Take 1 tablet (100 mg total) by mouth every 12 (twelve) hours. 03/11/23 04/22/23  Modena Slater, DO  escitalopram (LEXAPRO) 10 MG tablet Take 1 tablet (10 mg total) by mouth daily. 01/20/23   Nwoko, Stephens Shire E, PA  fluticasone (CUTIVATE) 0.05 % cream Apply 1 Application topically 2 (two)  times daily. 01/05/23   Atway, Rayann N, DO  gabapentin (NEURONTIN) 300 MG capsule Take 1 capsule (300 mg total) by mouth 2 (two) times daily. 11/17/22   Atway, Rayann N, DO  hydrALAZINE (APRESOLINE) 25 MG tablet Take 1 tablet (25 mg total) by mouth 3 (three) times daily. 11/18/22   Atway, Rayann N, DO  isosorbide mononitrate (IMDUR) 60 MG 24 hr tablet Take 1.5 tablets (90 mg total) by mouth daily. 03/11/23 04/10/23  Patel, Amar, DO  KLOR-CON M20 20 MEQ tablet TAKE ONE TABLET BY MOUTH DAILY Patient taking differently: Take 20 mEq by mouth  daily. 06/22/22   Laurey Morale, MD  LANTUS SOLOSTAR 100 UNIT/ML Solostar Pen INJECT 25 UNITS INTO THE SKIN EVERY DAY 03/29/23   Atway, Rayann N, DO  levETIRAcetam (KEPPRA) 500 MG tablet TAKE ONE TABLET BY MOUTH TWICE DAILY 03/29/23   Atway, Rayann N, DO  linaclotide (LINZESS) 290 MCG CAPS capsule Take 1 capsule (290 mcg total) by mouth daily as needed (Constipation). 03/29/23   Atway, Derwood Kaplan, DO  liver oil-zinc oxide (DESITIN) 40 % ointment Apply topically every 4 (four) hours. Patient taking differently: Apply 1 Application topically as needed for irritation. 09/15/22   Marolyn Haller, MD  metoprolol succinate (TOPROL-XL) 100 MG 24 hr tablet Take 1 tablet (100 mg total) by mouth daily. 03/11/23 04/10/23  Modena Slater, DO  pantoprazole (PROTONIX) 40 MG tablet TAKE ONE TABLET BY MOUTH EVERY DAY 03/29/23   Atway, Rayann N, DO  rivaroxaban (XARELTO) 20 MG TABS tablet Take 1 tablet (20 mg total) by mouth daily with supper. 04/04/23   Atway, Rayann N, DO  spironolactone (ALDACTONE) 100 MG tablet TAKE ONE TABLET BY MOUTH EVERY DAY 03/29/23   Atway, Rayann N, DO  STOOL SOFTENER/LAXATIVE 50-8.6 MG tablet TAKE ONE TABLET BY MOUTH AT BEDTIME AS NEEDED FOR MILD CONSTIPATION Patient taking differently: Take 1 tablet by mouth at bedtime as needed for mild constipation. 01/28/20   Reymundo Poll, MD  traZODone (DESYREL) 50 MG tablet Take 1 tablet (50 mg total) by mouth at bedtime. 01/20/23   Nwoko, Uchenna E, PA  TRULICITY 0.75 MG/0.5ML SOPN INJECT 0.75 MG SUBCUTANEOUSLY ONCE WEEKLY 03/30/23   Atway, Rayann N, DO  VENTOLIN HFA 108 (90 Base) MCG/ACT inhaler INHALE TWO PUFFS INTO THE LUNGS EVERY 6 HOURS AS NEEDED FOR WHEEZING OR SHORTNESS OF BREATH 03/29/23   Atway, Rayann N, DO  Vitamin D, Ergocalciferol, (DRISDOL) 1.25 MG (50000 UNIT) CAPS capsule TAKE ONE CAPSULE BY MOUTH EVERY 7 DAYS 03/30/23   Atway, Rayann N, DO      Allergies    Ace inhibitors, Cleocin [clindamycin], Enalapril maleate, and Zestril [lisinopril]     Review of Systems   Review of Systems  Skin:  Positive for wound.  All other systems reviewed and are negative.   Physical Exam Updated Vital Signs BP 135/81 (BP Location: Right Arm)   Pulse 83   Temp 98.4 F (36.9 C) (Oral)   Resp 15   LMP 09/13/2019 (Exact Date)   SpO2 98%  Physical Exam Vitals and nursing note reviewed.  Constitutional:      General: She is not in acute distress.    Appearance: Normal appearance. She is well-developed. She is not ill-appearing, toxic-appearing or diaphoretic.  HENT:     Head: Normocephalic and atraumatic.     Right Ear: External ear normal.     Left Ear: External ear normal.     Nose: Nose normal.     Mouth/Throat:  Mouth: Mucous membranes are moist.  Eyes:     Extraocular Movements: Extraocular movements intact.     Conjunctiva/sclera: Conjunctivae normal.  Cardiovascular:     Rate and Rhythm: Normal rate and regular rhythm.  Pulmonary:     Effort: Pulmonary effort is normal. No respiratory distress.  Abdominal:     General: There is no distension.     Palpations: Abdomen is soft.     Tenderness: There is no abdominal tenderness.  Musculoskeletal:        General: No swelling. Normal range of motion.     Cervical back: Normal range of motion and neck supple.     Right lower leg: No edema.     Left lower leg: No edema.  Skin:    General: Skin is warm and dry.     Capillary Refill: Capillary refill takes less than 2 seconds.     Coloration: Skin is not jaundiced or pale.     Comments: Wound to area of fifth MTP of right foot.  Neurological:     General: No focal deficit present.     Mental Status: She is alert and oriented to person, place, and time.  Psychiatric:        Mood and Affect: Mood normal.        Behavior: Behavior normal.        ED Results / Procedures / Treatments   Labs (all labs ordered are listed, but only abnormal results are displayed) Labs Reviewed  COMPREHENSIVE METABOLIC PANEL - Abnormal;  Notable for the following components:      Result Value   Sodium 134 (*)    Glucose, Bld 263 (*)    All other components within normal limits  CBC WITH DIFFERENTIAL/PLATELET - Abnormal; Notable for the following components:   Hemoglobin 10.8 (*)    HCT 34.6 (*)    All other components within normal limits  CBG MONITORING, ED - Abnormal; Notable for the following components:   Glucose-Capillary 250 (*)    All other components within normal limits  CULTURE, BLOOD (ROUTINE X 2)  CULTURE, BLOOD (ROUTINE X 2)  LACTIC ACID, PLASMA  LACTIC ACID, PLASMA    EKG None  Radiology DG Foot Complete Right  Result Date: 04/12/2023 CLINICAL DATA:  Nonhealing wound along lateral side of foot. EXAM: RIGHT FOOT COMPLETE - 3+ VIEW COMPARISON:  Foot radiographs and CT foot 03/05/2023. FINDINGS: There is erosion of the head of fifth metatarsal which has progressed since the prior study from 03/05/2023 consistent with osteomyelitis. Overlying soft tissue irregularity likely corresponds to the reported nonhealing wound. There is no other osseous erosion or destruction. There is no acute fracture or dislocation. Bony alignment is normal. The joint spaces are preserved. IMPRESSION: Progressed erosion of the head of fifth metatarsal consistent with osteomyelitis. Electronically Signed   By: Lesia Hausen M.D.   On: 04/12/2023 11:36    Procedures Procedures    Medications Ordered in ED Medications - No data to display  ED Course/ Medical Decision Making/ A&P                             Medical Decision Making Amount and/or Complexity of Data Reviewed Labs: ordered. Radiology: ordered.   This patient presents to the ED for concern of foot wound, this involves an extensive number of treatment options, and is a complaint that carries with it a high risk of complications and morbidity.  The differential  diagnosis includes worsening infection, appropriately healing diabetic foot ulcer   Co morbidities  that complicate the patient evaluation  OSA, HLD, cardiomyopathy, ICD placement, CVA with left hemiparesis, T2DM, obesity, atrial fibrillation, HTN, CHF, seizures, neuropathy, arthritis   Additional history obtained:  Additional history obtained from N/A External records from outside source obtained and reviewed including EMR   Lab Tests:  I Ordered, and personally interpreted labs.  The pertinent results include: No leukocytosis, normal lactate, moderate hyperglycemia without evidence of DKA, normal electrolytes   Imaging Studies ordered:  I ordered imaging studies including right foot x-ray I independently visualized and interpreted imaging which showed progressive erosion of fifth metatarsal head I agree with the radiologist interpretation   Cardiac Monitoring: / EKG:  The patient was maintained on a cardiac monitor.  I personally viewed and interpreted the cardiac monitored which showed an underlying rhythm of: Sinus rhythm   Consultations Obtained:  I requested consultation with the infectious disease,  and discussed lab and imaging findings as well as pertinent plan - they recommend: Continuing course of doxycycline I requested consultation with the orthopedic surgeon, Dr. Blanchie Dessert,  and discussed lab and imaging findings as well as pertinent plan - they recommend: Continue current therapy and follow-up with Dr. Lajoyce Corners   Problem List / ED Course / Critical interventions / Medication management  Patient presents for concern of worsening wound.  She has noticed a foul smell over the past 4 days.  She denies any other new symptoms.  She has been adherent to her home doxycycline.  On arrival in the ED, patient is well-appearing.  Wound shows loose stitches in place with underlying granulous tissue.  I do not appreciate a foul odor.  I do not appreciate active drainage.  Dressing, which was applied today, is dry.  X-ray and lab work were ordered.  Lab work is reassuring.  X-ray  imaging showed progressed erosion of fifth metatarsal head.  I discussed this finding with orthopedic surgeon on-call, who recommends outpatient follow-up.  I discussed her current antibiotic regimen with infectious disease who advises to continue current doxycycline.  Patient was informed of these recommendations and is comfortable with discharge home.  She was discharged in stable condition.   Social Determinants of Health:  Has access to outpatient care        Final Clinical Impression(s) / ED Diagnoses Final diagnoses:  Osteomyelitis of right foot, unspecified type [facility])    Rx / DC Orders ED Discharge Orders     None         Gloris Manchester, MD 04/12/23 1253

## 2023-04-13 LAB — CULTURE, BLOOD (ROUTINE X 2)

## 2023-04-14 ENCOUNTER — Ambulatory Visit (HOSPITAL_BASED_OUTPATIENT_CLINIC_OR_DEPARTMENT_OTHER): Payer: Medicaid Other | Admitting: Internal Medicine

## 2023-04-14 ENCOUNTER — Ambulatory Visit: Payer: Medicaid Other | Admitting: Internal Medicine

## 2023-04-15 LAB — CULTURE, BLOOD (ROUTINE X 2)
Culture: NO GROWTH
Culture: NO GROWTH
Special Requests: ADEQUATE
Special Requests: ADEQUATE

## 2023-04-21 ENCOUNTER — Ambulatory Visit: Payer: Medicaid Other | Admitting: Internal Medicine

## 2023-04-25 ENCOUNTER — Telehealth: Payer: Self-pay | Admitting: *Deleted

## 2023-04-25 NOTE — Telephone Encounter (Signed)
[name], OT with [facility], called in requesting sliding board and drop arm 3-in-1 commode. Patient has not been seen following ED visit on 04/12/23. [name] states it is most likely 2/2 lack of transportation. Patient is non-weight bearing on left 2/2 wound, and CVA affected right side. [name] will contact patient to schedule at least a tele visit for DME items.

## 2023-04-26 ENCOUNTER — Ambulatory Visit (HOSPITAL_BASED_OUTPATIENT_CLINIC_OR_DEPARTMENT_OTHER): Payer: Medicaid Other | Admitting: Internal Medicine

## 2023-04-26 ENCOUNTER — Encounter (HOSPITAL_COMMUNITY): Payer: Self-pay | Admitting: Physician Assistant

## 2023-04-26 ENCOUNTER — Ambulatory Visit (INDEPENDENT_AMBULATORY_CARE_PROVIDER_SITE_OTHER): Payer: Medicaid Other | Admitting: Student

## 2023-04-26 ENCOUNTER — Telehealth (INDEPENDENT_AMBULATORY_CARE_PROVIDER_SITE_OTHER): Payer: Medicaid Other | Admitting: Physician Assistant

## 2023-04-26 DIAGNOSIS — L089 Local infection of the skin and subcutaneous tissue, unspecified: Secondary | ICD-10-CM

## 2023-04-26 DIAGNOSIS — I69354 Hemiplegia and hemiparesis following cerebral infarction affecting left non-dominant side: Secondary | ICD-10-CM | POA: Diagnosis not present

## 2023-04-26 DIAGNOSIS — F331 Major depressive disorder, recurrent, moderate: Secondary | ICD-10-CM

## 2023-04-26 DIAGNOSIS — F5105 Insomnia due to other mental disorder: Secondary | ICD-10-CM

## 2023-04-26 DIAGNOSIS — E1140 Type 2 diabetes mellitus with diabetic neuropathy, unspecified: Secondary | ICD-10-CM | POA: Diagnosis not present

## 2023-04-26 DIAGNOSIS — E11628 Type 2 diabetes mellitus with other skin complications: Secondary | ICD-10-CM | POA: Diagnosis not present

## 2023-04-26 DIAGNOSIS — F411 Generalized anxiety disorder: Secondary | ICD-10-CM | POA: Diagnosis not present

## 2023-04-26 DIAGNOSIS — F4310 Post-traumatic stress disorder, unspecified: Secondary | ICD-10-CM

## 2023-04-26 DIAGNOSIS — E1165 Type 2 diabetes mellitus with hyperglycemia: Secondary | ICD-10-CM | POA: Diagnosis not present

## 2023-04-26 MED ORDER — TRAZODONE HCL 100 MG PO TABS
100.0000 mg | ORAL_TABLET | Freq: Every day | ORAL | 1 refills | Status: DC
Start: 2023-04-26 — End: 2024-04-13

## 2023-04-26 MED ORDER — BUSPIRONE HCL 10 MG PO TABS
10.0000 mg | ORAL_TABLET | Freq: Two times a day (BID) | ORAL | 1 refills | Status: DC
Start: 2023-04-26 — End: 2024-04-13

## 2023-04-26 MED ORDER — ESCITALOPRAM OXALATE 20 MG PO TABS
20.0000 mg | ORAL_TABLET | Freq: Every day | ORAL | 1 refills | Status: DC
Start: 2023-04-26 — End: 2024-04-13

## 2023-04-26 NOTE — Progress Notes (Signed)
CC: DME  This is a telephone encounter between [name] and Rana Snare on 04/26/2023 for DME requests. The visit was conducted with the patient located at home and Rana Snare at [facility]. The patient's identity was confirmed using their DOB and current address. The patient has consented to being evaluated through a telephone encounter and understands the associated risks (an examination cannot be done and the patient may need to come in for an appointment) / benefits (allows the patient to remain at home, decreasing exposure to coronavirus). I personally spent 7 minutes on medical discussion.   HPI:  [name] is a 57 y.o. with PMH as below.   Please see A&P for assessment of the patient's acute and chronic medical conditions.   Past Medical History:  Diagnosis Date   Acute cystitis 06/19/2021   AKI (acute kidney injury) (HCC) 09/13/2022   AKI (acute kidney injury) (HCC) 09/13/2022   Allergic rhinitis    Arthritis    "hips, back, legs, arms" (07/04/2014)   Asthma    hx   Automatic implantable cardioverter-defibrillator in situ    Calcifying tendinitis of shoulder    Chronic combined systolic and diastolic CHF (congestive heart failure) (HCC)    EF 40-45% by echo 12/06/2012   Chronic diastolic heart failure (HCC)     Primarily diastolic CHF: Likely due to uncontrolled HTN. Last echo (8/12) with EF 45-50%, mild to moderate LVH with some asymmetric septal hypertrophy, RV normal size and systolic function. EF 50-55% by LV-gram in 6/12.    Chronic lower back pain    secondary to DJD, obsetiy, hip problems. Followed by Dr. Ivory Broad (pain management)   Chronic systolic heart failure (HCC) 05/17/2022   Chronic use of opiate for therapeutic purpose 12/14/2016   Contact with and (suspected) exposure to covid-19 02/07/2020   Coronary artery disease    questionable. LHC 05/2011 showing normal coronaries // Followed at [facility], Dr. Shirlee Latch   Degeneration of  lumbar or lumbosacral intervertebral disc    DJD (degenerative joint disease) of hip    right sided   Fatigue 04/27/2022   Frequent UTI    GERD (gastroesophageal reflux disease)    HLD (hyperlipidemia)    Hypertension    Poorly controlled. Has had HTN since age 1. Angioedema with ACEI.  24 Hr urine and renal arterial dopplers ordered . . . Never done   LBBB (left bundle branch block)    Left shoulder pain 06/30/2015   Left spastic hemiparesis (HCC) 09/23/2015   Liver disease    Lumbago 11/22/2016   Morbid obesity (HCC)    Muscle spasm 06/19/2021   Need for pneumococcal vaccine 09/09/2017   NICM (nonischemic cardiomyopathy) (HCC)    EF 45-50% in 8/12, cath 6/12 showed normal coronaries, EF 50-55% by LV gram   OSA on CPAP    sleep study in 8/12 showed moderate to severe OSA requiring CPAP   Perimenopausal 03/28/2017   Polyneuropathy in diabetes(357.2)    Presence of permanent cardiac pacemaker    Rash 07/14/2016   Seizures (HCC)    last 3 months   Shortness of breath    none now   Stroke [facility]) 12/2013   "my left side is paralyzed" (07/04/2014)   Thoracic or lumbosacral neuritis or radiculitis, unspecified    Type II diabetes mellitus (HCC) DX: 2002   Urinary frequency 11/05/2014   Vaginal discharge 02/05/2021   Review of Systems:  right foot wound    Assessment & Plan:  Diabetic foot infection (HCC) Patient states ongoing right foot wound without much improvement. Unable to examine due to telehealth visit. Encouraged strongly that patient to follow with wound clinic as soon as possible for further evaluation. Provided patient with phone number for wound clinic. She also has f/u visit with ID scheduled. Provided precautions and if needed ED evaluation for potential hospitalization. Patient verbalizes understanding and agrees with plan.   Hemiparesis affecting left side as late effect of cerebrovascular accident [facility]) Patient has HH services and OT saw her yesterday.  Recommend sliding board and drop arm 3-in-1 commode for the patient. She has left hemiparesis from prior CVA and non-weight bearing of right foot due to ongoing foot wound. Will place DME referral for these items.     Patient discussed with Dr. Halina Andreas, D.O. [facility], PGY-1 Phone: [phone]
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
## 2023-04-26 NOTE — Assessment & Plan Note (Signed)
Patient has HH services and OT saw her yesterday. Recommend sliding board and drop arm 3-in-1 commode for the patient. She has left hemiparesis from prior CVA and non-weight bearing of right foot due to ongoing foot wound. Will place DME referral for these items.

## 2023-04-26 NOTE — Assessment & Plan Note (Addendum)
Patient states ongoing right foot wound without much improvement. Unable to examine due to telehealth visit. Encouraged strongly that patient to follow with wound clinic as soon as possible for further evaluation. Provided patient with phone number for wound clinic. She also has f/u visit with ID scheduled. Provided precautions and if needed ED evaluation for potential hospitalization. Patient verbalizes understanding and agrees with plan.

## 2023-04-27 NOTE — Progress Notes (Signed)
Internal Medicine Clinic Attending  Case discussed with Dr. Zheng  At the time of the visit.  We reviewed the resident's history and exam and pertinent patient test results.  I agree with the assessment, diagnosis, and plan of care documented in the resident's note.  

## 2023-05-03 ENCOUNTER — Telehealth: Payer: Self-pay

## 2023-05-03 NOTE — Telephone Encounter (Signed)
[facility] is requesting a call back  about the DME medical nes form that was faxed over 5/14 ... They have not received anything back ... I will ask that she faxes another one just incase .[name]    Name Craige Cotta [phone]   OPT # 1

## 2023-05-04 NOTE — Progress Notes (Signed)
Remote ICD transmission.   

## 2023-05-06 ENCOUNTER — Other Ambulatory Visit: Payer: Self-pay | Admitting: Internal Medicine

## 2023-05-11 ENCOUNTER — Telehealth: Payer: Self-pay | Admitting: Podiatry

## 2023-05-11 NOTE — Telephone Encounter (Signed)
Patient wants you to fax over order for diabetic shoes to [facility],  she can't get a new brace this year because it hasn't been 5 years yet.

## 2023-05-12 NOTE — Telephone Encounter (Signed)
Needs vitamin D recheck before refilling this high dose supplement.

## 2023-05-15 ENCOUNTER — Inpatient Hospital Stay (HOSPITAL_COMMUNITY)
Admission: EM | Admit: 2023-05-15 | Discharge: 2023-05-20 | DRG: 854 | Disposition: A | Discharge status: Home Health | Payer: Medicaid Other | Attending: Internal Medicine | Admitting: Internal Medicine

## 2023-05-15 DIAGNOSIS — Z888 Allergy status to other drugs, medicaments and biological substances status: Secondary | ICD-10-CM

## 2023-05-15 DIAGNOSIS — E11621 Type 2 diabetes mellitus with foot ulcer: Secondary | ICD-10-CM | POA: Diagnosis present

## 2023-05-15 DIAGNOSIS — G4733 Obstructive sleep apnea (adult) (pediatric): Secondary | ICD-10-CM | POA: Diagnosis present

## 2023-05-15 DIAGNOSIS — E785 Hyperlipidemia, unspecified: Secondary | ICD-10-CM | POA: Diagnosis present

## 2023-05-15 DIAGNOSIS — Z83511 Family history of glaucoma: Secondary | ICD-10-CM

## 2023-05-15 DIAGNOSIS — Z79899 Other long term (current) drug therapy: Secondary | ICD-10-CM

## 2023-05-15 DIAGNOSIS — Z881 Allergy status to other antibiotic agents status: Secondary | ICD-10-CM

## 2023-05-15 DIAGNOSIS — J45909 Unspecified asthma, uncomplicated: Secondary | ICD-10-CM | POA: Diagnosis present

## 2023-05-15 DIAGNOSIS — Z6833 Body mass index (BMI) 33.0-33.9, adult: Secondary | ICD-10-CM

## 2023-05-15 DIAGNOSIS — I878 Other specified disorders of veins: Secondary | ICD-10-CM | POA: Diagnosis present

## 2023-05-15 DIAGNOSIS — Z794 Long term (current) use of insulin: Secondary | ICD-10-CM

## 2023-05-15 DIAGNOSIS — Z7985 Long-term (current) use of injectable non-insulin antidiabetic drugs: Secondary | ICD-10-CM

## 2023-05-15 DIAGNOSIS — N3281 Overactive bladder: Secondary | ICD-10-CM | POA: Diagnosis present

## 2023-05-15 DIAGNOSIS — Z833 Family history of diabetes mellitus: Secondary | ICD-10-CM

## 2023-05-15 DIAGNOSIS — Z95810 Presence of automatic (implantable) cardiac defibrillator: Secondary | ICD-10-CM

## 2023-05-15 DIAGNOSIS — E11628 Type 2 diabetes mellitus with other skin complications: Secondary | ICD-10-CM | POA: Diagnosis present

## 2023-05-15 DIAGNOSIS — E876 Hypokalemia: Secondary | ICD-10-CM | POA: Diagnosis present

## 2023-05-15 DIAGNOSIS — I428 Other cardiomyopathies: Secondary | ICD-10-CM | POA: Diagnosis present

## 2023-05-15 DIAGNOSIS — I69354 Hemiplegia and hemiparesis following cerebral infarction affecting left non-dominant side: Secondary | ICD-10-CM

## 2023-05-15 DIAGNOSIS — M868X7 Other osteomyelitis, ankle and foot: Secondary | ICD-10-CM | POA: Diagnosis present

## 2023-05-15 DIAGNOSIS — F419 Anxiety disorder, unspecified: Secondary | ICD-10-CM | POA: Diagnosis present

## 2023-05-15 DIAGNOSIS — I5082 Biventricular heart failure: Secondary | ICD-10-CM | POA: Diagnosis present

## 2023-05-15 DIAGNOSIS — L03115 Cellulitis of right lower limb: Secondary | ICD-10-CM | POA: Diagnosis present

## 2023-05-15 DIAGNOSIS — L97519 Non-pressure chronic ulcer of other part of right foot with unspecified severity: Secondary | ICD-10-CM | POA: Diagnosis present

## 2023-05-15 DIAGNOSIS — A419 Sepsis, unspecified organism: Principal | ICD-10-CM | POA: Diagnosis present

## 2023-05-15 DIAGNOSIS — E1142 Type 2 diabetes mellitus with diabetic polyneuropathy: Secondary | ICD-10-CM | POA: Diagnosis present

## 2023-05-15 DIAGNOSIS — M869 Osteomyelitis, unspecified: Secondary | ICD-10-CM | POA: Diagnosis present

## 2023-05-15 DIAGNOSIS — Z8249 Family history of ischemic heart disease and other diseases of the circulatory system: Secondary | ICD-10-CM

## 2023-05-15 DIAGNOSIS — Z7901 Long term (current) use of anticoagulants: Secondary | ICD-10-CM

## 2023-05-15 DIAGNOSIS — I110 Hypertensive heart disease with heart failure: Secondary | ICD-10-CM | POA: Diagnosis present

## 2023-05-15 DIAGNOSIS — Z803 Family history of malignant neoplasm of breast: Secondary | ICD-10-CM

## 2023-05-15 DIAGNOSIS — I5042 Chronic combined systolic (congestive) and diastolic (congestive) heart failure: Secondary | ICD-10-CM | POA: Diagnosis present

## 2023-05-15 DIAGNOSIS — E1169 Type 2 diabetes mellitus with other specified complication: Secondary | ICD-10-CM | POA: Diagnosis present

## 2023-05-15 DIAGNOSIS — Z823 Family history of stroke: Secondary | ICD-10-CM

## 2023-05-15 DIAGNOSIS — L089 Local infection of the skin and subcutaneous tissue, unspecified: Secondary | ICD-10-CM | POA: Diagnosis present

## 2023-05-15 DIAGNOSIS — I480 Paroxysmal atrial fibrillation: Secondary | ICD-10-CM | POA: Diagnosis present

## 2023-05-15 DIAGNOSIS — K219 Gastro-esophageal reflux disease without esophagitis: Secondary | ICD-10-CM | POA: Diagnosis present

## 2023-05-15 DIAGNOSIS — E1165 Type 2 diabetes mellitus with hyperglycemia: Secondary | ICD-10-CM

## 2023-05-15 DIAGNOSIS — Z841 Family history of disorders of kidney and ureter: Secondary | ICD-10-CM

## 2023-05-15 DIAGNOSIS — F32A Depression, unspecified: Secondary | ICD-10-CM | POA: Diagnosis present

## 2023-05-15 DIAGNOSIS — I447 Left bundle-branch block, unspecified: Secondary | ICD-10-CM | POA: Diagnosis present

## 2023-05-16 ENCOUNTER — Other Ambulatory Visit: Payer: Self-pay

## 2023-05-16 ENCOUNTER — Inpatient Hospital Stay (HOSPITAL_COMMUNITY): Payer: Medicaid Other

## 2023-05-16 ENCOUNTER — Ambulatory Visit: Payer: Medicaid Other | Admitting: Podiatry

## 2023-05-16 ENCOUNTER — Emergency Department (HOSPITAL_COMMUNITY): Payer: Medicaid Other

## 2023-05-16 ENCOUNTER — Ambulatory Visit: Payer: Medicaid Other | Admitting: Internal Medicine

## 2023-05-16 ENCOUNTER — Encounter (HOSPITAL_COMMUNITY): Payer: Self-pay | Admitting: Emergency Medicine

## 2023-05-16 DIAGNOSIS — N189 Chronic kidney disease, unspecified: Secondary | ICD-10-CM | POA: Diagnosis not present

## 2023-05-16 DIAGNOSIS — M86671 Other chronic osteomyelitis, right ankle and foot: Secondary | ICD-10-CM | POA: Diagnosis not present

## 2023-05-16 DIAGNOSIS — A419 Sepsis, unspecified organism: Secondary | ICD-10-CM | POA: Diagnosis present

## 2023-05-16 DIAGNOSIS — Z6833 Body mass index (BMI) 33.0-33.9, adult: Secondary | ICD-10-CM | POA: Diagnosis not present

## 2023-05-16 DIAGNOSIS — F32A Depression, unspecified: Secondary | ICD-10-CM | POA: Diagnosis present

## 2023-05-16 DIAGNOSIS — E1122 Type 2 diabetes mellitus with diabetic chronic kidney disease: Secondary | ICD-10-CM | POA: Diagnosis not present

## 2023-05-16 DIAGNOSIS — E1169 Type 2 diabetes mellitus with other specified complication: Secondary | ICD-10-CM | POA: Diagnosis present

## 2023-05-16 DIAGNOSIS — M7989 Other specified soft tissue disorders: Secondary | ICD-10-CM

## 2023-05-16 DIAGNOSIS — I480 Paroxysmal atrial fibrillation: Secondary | ICD-10-CM

## 2023-05-16 DIAGNOSIS — I130 Hypertensive heart and chronic kidney disease with heart failure and stage 1 through stage 4 chronic kidney disease, or unspecified chronic kidney disease: Secondary | ICD-10-CM | POA: Diagnosis not present

## 2023-05-16 DIAGNOSIS — L97519 Non-pressure chronic ulcer of other part of right foot with unspecified severity: Secondary | ICD-10-CM | POA: Diagnosis present

## 2023-05-16 DIAGNOSIS — L03115 Cellulitis of right lower limb: Secondary | ICD-10-CM

## 2023-05-16 DIAGNOSIS — G4733 Obstructive sleep apnea (adult) (pediatric): Secondary | ICD-10-CM | POA: Diagnosis present

## 2023-05-16 DIAGNOSIS — I5042 Chronic combined systolic (congestive) and diastolic (congestive) heart failure: Secondary | ICD-10-CM | POA: Diagnosis present

## 2023-05-16 DIAGNOSIS — I110 Hypertensive heart disease with heart failure: Secondary | ICD-10-CM | POA: Diagnosis present

## 2023-05-16 DIAGNOSIS — J45909 Unspecified asthma, uncomplicated: Secondary | ICD-10-CM | POA: Diagnosis present

## 2023-05-16 DIAGNOSIS — R569 Unspecified convulsions: Secondary | ICD-10-CM

## 2023-05-16 DIAGNOSIS — I509 Heart failure, unspecified: Secondary | ICD-10-CM | POA: Diagnosis not present

## 2023-05-16 DIAGNOSIS — F419 Anxiety disorder, unspecified: Secondary | ICD-10-CM | POA: Diagnosis present

## 2023-05-16 DIAGNOSIS — M868X7 Other osteomyelitis, ankle and foot: Secondary | ICD-10-CM | POA: Diagnosis present

## 2023-05-16 DIAGNOSIS — E876 Hypokalemia: Secondary | ICD-10-CM | POA: Diagnosis present

## 2023-05-16 DIAGNOSIS — E11621 Type 2 diabetes mellitus with foot ulcer: Secondary | ICD-10-CM | POA: Diagnosis present

## 2023-05-16 DIAGNOSIS — I428 Other cardiomyopathies: Secondary | ICD-10-CM | POA: Diagnosis present

## 2023-05-16 DIAGNOSIS — E785 Hyperlipidemia, unspecified: Secondary | ICD-10-CM | POA: Diagnosis present

## 2023-05-16 DIAGNOSIS — M869 Osteomyelitis, unspecified: Secondary | ICD-10-CM | POA: Diagnosis present

## 2023-05-16 DIAGNOSIS — Z794 Long term (current) use of insulin: Secondary | ICD-10-CM | POA: Diagnosis not present

## 2023-05-16 DIAGNOSIS — E11628 Type 2 diabetes mellitus with other skin complications: Secondary | ICD-10-CM | POA: Diagnosis present

## 2023-05-16 DIAGNOSIS — I5020 Unspecified systolic (congestive) heart failure: Secondary | ICD-10-CM | POA: Diagnosis not present

## 2023-05-16 DIAGNOSIS — M86171 Other acute osteomyelitis, right ankle and foot: Secondary | ICD-10-CM | POA: Diagnosis not present

## 2023-05-16 DIAGNOSIS — Z7901 Long term (current) use of anticoagulants: Secondary | ICD-10-CM

## 2023-05-16 DIAGNOSIS — D631 Anemia in chronic kidney disease: Secondary | ICD-10-CM | POA: Diagnosis not present

## 2023-05-16 DIAGNOSIS — E1165 Type 2 diabetes mellitus with hyperglycemia: Secondary | ICD-10-CM | POA: Diagnosis present

## 2023-05-16 DIAGNOSIS — I69354 Hemiplegia and hemiparesis following cerebral infarction affecting left non-dominant side: Secondary | ICD-10-CM | POA: Diagnosis not present

## 2023-05-16 DIAGNOSIS — E1142 Type 2 diabetes mellitus with diabetic polyneuropathy: Secondary | ICD-10-CM | POA: Diagnosis present

## 2023-05-16 DIAGNOSIS — I5082 Biventricular heart failure: Secondary | ICD-10-CM | POA: Diagnosis present

## 2023-05-16 LAB — COMPREHENSIVE METABOLIC PANEL
ALT: 15 U/L (ref 0–44)
ALT: 12 U/L (ref 0–44)
AST: 17 U/L (ref 15–41)
AST: 10 U/L — ABNORMAL LOW (ref 15–41)
Albumin: 3.1 g/dL — ABNORMAL LOW (ref 3.5–5.0)
Albumin: 2.3 g/dL — ABNORMAL LOW (ref 3.5–5.0)
Alkaline Phosphatase: 88 U/L (ref 38–126)
Alkaline Phosphatase: 65 U/L (ref 38–126)
Anion gap: 13 (ref 5–15)
Anion gap: 5 (ref 5–15)
BUN: 6 mg/dL (ref 6–20)
BUN: 7 mg/dL (ref 6–20)
CO2: 24 mmol/L (ref 22–32)
CO2: 20 mmol/L — ABNORMAL LOW (ref 22–32)
Calcium: 8.6 mg/dL — ABNORMAL LOW (ref 8.9–10.3)
Calcium: 7.2 mg/dL — ABNORMAL LOW (ref 8.9–10.3)
Chloride: 94 mmol/L — ABNORMAL LOW (ref 98–111)
Chloride: 109 mmol/L (ref 98–111)
Creatinine, Ser: 1.05 mg/dL — ABNORMAL HIGH (ref 0.44–1.00)
Creatinine, Ser: 0.87 mg/dL (ref 0.44–1.00)
GFR, Estimated: >60 mL/min (ref >60)
GFR, Estimated: >60 mL/min (ref >60)
Glucose, Bld: 274 mg/dL — ABNORMAL HIGH (ref 70–99)
Glucose, Bld: 213 mg/dL — ABNORMAL HIGH (ref 70–99)
Potassium: 2.8 mmol/L — ABNORMAL LOW (ref 3.5–5.1)
Potassium: 2.8 mmol/L — ABNORMAL LOW (ref 3.5–5.1)
Sodium: 131 mmol/L — ABNORMAL LOW (ref 135–145)
Sodium: 134 mmol/L — ABNORMAL LOW (ref 135–145)
Total Bilirubin: 2.2 mg/dL — ABNORMAL HIGH (ref 0.3–1.2)
Total Bilirubin: 1.6 mg/dL — ABNORMAL HIGH (ref 0.3–1.2)
Total Protein: 7.6 g/dL (ref 6.5–8.1)
Total Protein: 6.2 g/dL — ABNORMAL LOW (ref 6.5–8.1)

## 2023-05-16 LAB — GLUCOSE, CAPILLARY
Glucose-Capillary: 266 mg/dL — ABNORMAL HIGH (ref 70–99)
Glucose-Capillary: 267 mg/dL — ABNORMAL HIGH (ref 70–99)
Glucose-Capillary: 219 mg/dL — ABNORMAL HIGH (ref 70–99)
Glucose-Capillary: 226 mg/dL — ABNORMAL HIGH (ref 70–99)
Glucose-Capillary: 214 mg/dL — ABNORMAL HIGH (ref 70–99)

## 2023-05-16 LAB — PROTIME-INR
INR: 1.8 — ABNORMAL HIGH (ref 0.8–1.2)
Prothrombin Time: 21.1 seconds — ABNORMAL HIGH (ref 11.4–15.2)

## 2023-05-16 LAB — CBC WITH DIFFERENTIAL/PLATELET
Abs Immature Granulocytes: 0.15 10*3/uL — ABNORMAL HIGH (ref 0.00–0.07)
Basophils Absolute: 0 10*3/uL (ref 0.0–0.1)
Basophils Relative: 0 %
Eosinophils Absolute: 0 10*3/uL (ref 0.0–0.5)
Eosinophils Relative: 0 %
HCT: 31.7 % — ABNORMAL LOW (ref 36.0–46.0)
Hemoglobin: 10.2 g/dL — ABNORMAL LOW (ref 12.0–15.0)
Immature Granulocytes: 1 %
Lymphocytes Relative: 13 %
Lymphs Abs: 2.4 10*3/uL (ref 0.7–4.0)
MCH: 27.5 pg (ref 26.0–34.0)
MCHC: 32.2 g/dL (ref 30.0–36.0)
MCV: 85.4 fL (ref 80.0–100.0)
Monocytes Absolute: 2 10*3/uL — ABNORMAL HIGH (ref 0.1–1.0)
Monocytes Relative: 11 %
Neutro Abs: 13.6 10*3/uL — ABNORMAL HIGH (ref 1.7–7.7)
Neutrophils Relative %: 75 %
Platelets: 165 10*3/uL (ref 150–400)
RBC: 3.71 MIL/uL — ABNORMAL LOW (ref 3.87–5.11)
RDW: 13.3 % (ref 11.5–15.5)
WBC: 18.1 10*3/uL — ABNORMAL HIGH (ref 4.0–10.5)
nRBC: 0 % (ref 0.0–0.2)

## 2023-05-16 LAB — LACTIC ACID, PLASMA
Lactic Acid, Venous: 1.4 mmol/L (ref 0.5–1.9)
Lactic Acid, Venous: 1.1 mmol/L (ref 0.5–1.9)

## 2023-05-16 LAB — PHOSPHORUS: Phosphorus: 2 mg/dL — ABNORMAL LOW (ref 2.5–4.6)

## 2023-05-16 LAB — APTT: aPTT: 46 seconds — ABNORMAL HIGH (ref 24–36)

## 2023-05-16 LAB — CULTURE, BLOOD (ROUTINE X 2)

## 2023-05-16 LAB — MAGNESIUM: Magnesium: 1.1 mg/dL — ABNORMAL LOW (ref 1.7–2.4)

## 2023-05-16 MED ORDER — LINACLOTIDE 145 MCG PO CAPS: 290.0000 ug | ORAL_CAPSULE | Freq: Every day | ORAL | Status: DC | PRN

## 2023-05-16 MED ORDER — TRAZODONE HCL 100 MG PO TABS
100.0000 mg | ORAL_TABLET | Freq: Every day | ORAL | Status: DC
  Administered 2023-05-16 – 2023-05-19 (×4): 100 mg via ORAL
  Filled 2023-05-16 (×4): qty 1

## 2023-05-16 MED ORDER — METRONIDAZOLE 500 MG/100ML IV SOLN
500.0000 mg | Freq: Two times a day (BID) | INTRAVENOUS | Status: AC
  Administered 2023-05-16 – 2023-05-18 (×6): 500 mg via INTRAVENOUS
  Filled 2023-05-16 (×6): qty 100

## 2023-05-16 MED ORDER — POTASSIUM CHLORIDE 10 MEQ/100ML IV SOLN
10.0000 meq | INTRAVENOUS | Status: AC
  Administered 2023-05-16 (×3): 10 meq via INTRAVENOUS
  Filled 2023-05-16 (×3): qty 100

## 2023-05-16 MED ORDER — INSULIN ASPART 100 UNIT/ML IJ SOLN
0.0000 [IU] | INTRAMUSCULAR | Status: DC
  Administered 2023-05-16: 8 [IU] via SUBCUTANEOUS
  Administered 2023-05-16 (×3): 5 [IU] via SUBCUTANEOUS
  Administered 2023-05-16: 8 [IU] via SUBCUTANEOUS
  Administered 2023-05-17 (×2): 5 [IU] via SUBCUTANEOUS
  Administered 2023-05-17: 2 [IU] via SUBCUTANEOUS
  Administered 2023-05-17 (×2): 8 [IU] via SUBCUTANEOUS
  Administered 2023-05-17: 3 [IU] via SUBCUTANEOUS
  Administered 2023-05-18: 2 [IU] via SUBCUTANEOUS
  Administered 2023-05-18: 3 [IU] via SUBCUTANEOUS
  Administered 2023-05-18: 8 [IU] via SUBCUTANEOUS
  Administered 2023-05-18: 2 [IU] via SUBCUTANEOUS
  Administered 2023-05-18 – 2023-05-19 (×3): 3 [IU] via SUBCUTANEOUS
  Administered 2023-05-19: 5 [IU] via SUBCUTANEOUS
  Administered 2023-05-19 (×2): 3 [IU] via SUBCUTANEOUS
  Administered 2023-05-19: 8 [IU] via SUBCUTANEOUS
  Administered 2023-05-20: 3 [IU] via SUBCUTANEOUS
  Administered 2023-05-20 (×3): 2 [IU] via SUBCUTANEOUS

## 2023-05-16 MED ORDER — FUROSEMIDE 40 MG PO TABS
40.0000 mg | ORAL_TABLET | Freq: Every day | ORAL | Status: DC
  Administered 2023-05-16 – 2023-05-20 (×5): 40 mg via ORAL
  Filled 2023-05-16 (×5): qty 1

## 2023-05-16 MED ORDER — ACETAMINOPHEN 650 MG RE SUPP: 650.0000 mg | Freq: Four times a day (QID) | RECTAL | Status: DC | PRN

## 2023-05-16 MED ORDER — GABAPENTIN 300 MG PO CAPS
300.0000 mg | ORAL_CAPSULE | Freq: Two times a day (BID) | ORAL | Status: DC
  Administered 2023-05-16 – 2023-05-20 (×9): 300 mg via ORAL
  Filled 2023-05-16 (×9): qty 1

## 2023-05-16 MED ORDER — SPIRONOLACTONE 100 MG PO TABS
100.0000 mg | ORAL_TABLET | Freq: Every day | ORAL | Status: DC
  Administered 2023-05-16 – 2023-05-20 (×4): 100 mg via ORAL
  Filled 2023-05-16 (×5): qty 1

## 2023-05-16 MED ORDER — K PHOS MONO-SOD PHOS DI & MONO 155-852-130 MG PO TABS
500.0000 mg | ORAL_TABLET | ORAL | Status: AC
  Administered 2023-05-16 (×3): 500 mg via ORAL
  Filled 2023-05-16 (×4): qty 2

## 2023-05-16 MED ORDER — METOPROLOL SUCCINATE ER 25 MG PO TB24
50.0000 mg | ORAL_TABLET | Freq: Every day | ORAL | Status: DC
  Administered 2023-05-16 – 2023-05-20 (×4): 50 mg via ORAL
  Filled 2023-05-16 (×5): qty 2

## 2023-05-16 MED ORDER — ESCITALOPRAM OXALATE 20 MG PO TABS
20.0000 mg | ORAL_TABLET | Freq: Every day | ORAL | Status: DC
  Administered 2023-05-17 – 2023-05-20 (×4): 20 mg via ORAL
  Filled 2023-05-16 (×4): qty 1

## 2023-05-16 MED ORDER — POLYETHYLENE GLYCOL 3350 17 G PO PACK: 17.0000 g | PACK | Freq: Every day | ORAL | Status: DC | PRN

## 2023-05-16 MED ORDER — LEVETIRACETAM 500 MG PO TABS
500.0000 mg | ORAL_TABLET | Freq: Two times a day (BID) | ORAL | Status: DC
  Administered 2023-05-16 – 2023-05-20 (×9): 500 mg via ORAL
  Filled 2023-05-16 (×9): qty 1

## 2023-05-16 MED ORDER — ATORVASTATIN CALCIUM 40 MG PO TABS
40.0000 mg | ORAL_TABLET | Freq: Every day | ORAL | Status: DC
  Administered 2023-05-17 – 2023-05-20 (×4): 40 mg via ORAL
  Filled 2023-05-16 (×4): qty 1

## 2023-05-16 MED ORDER — VANCOMYCIN HCL 2000 MG/400ML IV SOLN
2000.0000 mg | Freq: Once | INTRAVENOUS | Status: AC
  Administered 2023-05-16: 2000 mg via INTRAVENOUS
  Filled 2023-05-16: qty 400

## 2023-05-16 MED ORDER — MAGNESIUM SULFATE 4 GM/100ML IV SOLN
4.0000 g | Freq: Once | INTRAVENOUS | Status: AC
  Administered 2023-05-16: 4 g via INTRAVENOUS
  Filled 2023-05-16: qty 100

## 2023-05-16 MED ORDER — ENOXAPARIN SODIUM 60 MG/0.6ML IJ SOSY
50.0000 mg | PREFILLED_SYRINGE | Freq: Every day | INTRAMUSCULAR | Status: DC
  Administered 2023-05-16 – 2023-05-19 (×4): 50 mg via SUBCUTANEOUS
  Filled 2023-05-16 (×4): qty 0.6

## 2023-05-16 MED ORDER — INSULIN GLARGINE-YFGN 100 UNIT/ML ~~LOC~~ SOLN
25.0000 [IU] | Freq: Every day | SUBCUTANEOUS | Status: DC
  Filled 2023-05-16: qty 0.25

## 2023-05-16 MED ORDER — SENNOSIDES-DOCUSATE SODIUM 8.6-50 MG PO TABS
2.0000 | ORAL_TABLET | Freq: Every day | ORAL | Status: DC
  Administered 2023-05-17 – 2023-05-19 (×3): 2 via ORAL
  Filled 2023-05-16 (×3): qty 2

## 2023-05-16 MED ORDER — SODIUM CHLORIDE 0.9 % IV SOLN
2.0000 g | INTRAVENOUS | Status: DC
  Administered 2023-05-16: 2 g via INTRAVENOUS
  Filled 2023-05-16: qty 20

## 2023-05-16 MED ORDER — HYDRALAZINE HCL 25 MG PO TABS
25.0000 mg | ORAL_TABLET | Freq: Three times a day (TID) | ORAL | Status: DC
  Administered 2023-05-16 – 2023-05-20 (×9): 25 mg via ORAL
  Filled 2023-05-16 (×10): qty 1

## 2023-05-16 MED ORDER — ISOSORBIDE MONONITRATE ER 60 MG PO TB24
90.0000 mg | ORAL_TABLET | Freq: Every day | ORAL | Status: DC
  Administered 2023-05-16 – 2023-05-20 (×4): 90 mg via ORAL
  Filled 2023-05-16 (×5): qty 1

## 2023-05-16 MED ORDER — VANCOMYCIN HCL 1250 MG/250ML IV SOLN
1250.0000 mg | INTRAVENOUS | Status: DC
  Administered 2023-05-17 – 2023-05-19 (×4): 1250 mg via INTRAVENOUS
  Filled 2023-05-16 (×4): qty 250

## 2023-05-16 MED ORDER — INSULIN GLARGINE-YFGN 100 UNIT/ML ~~LOC~~ SOLN
25.0000 [IU] | Freq: Every day | SUBCUTANEOUS | Status: DC
  Administered 2023-05-16 – 2023-05-20 (×4): 25 [IU] via SUBCUTANEOUS
  Filled 2023-05-16 (×7): qty 0.25

## 2023-05-16 MED ORDER — SODIUM CHLORIDE 0.9 % IV SOLN
2.0000 g | Freq: Three times a day (TID) | INTRAVENOUS | Status: DC
  Administered 2023-05-16 – 2023-05-20 (×13): 2 g via INTRAVENOUS
  Filled 2023-05-16 (×13): qty 12.5

## 2023-05-16 MED ORDER — POTASSIUM CHLORIDE CRYS ER 20 MEQ PO TBCR
40.0000 meq | EXTENDED_RELEASE_TABLET | Freq: Once | ORAL | Status: AC
  Administered 2023-05-16: 40 meq via ORAL
  Filled 2023-05-16: qty 2

## 2023-05-16 MED ORDER — LINACLOTIDE 145 MCG PO CAPS
290.0000 ug | ORAL_CAPSULE | Freq: Once | ORAL | Status: AC
  Administered 2023-05-16: 290 ug via ORAL
  Filled 2023-05-16: qty 2

## 2023-05-16 MED ORDER — DICLOFENAC SODIUM 1 % EX GEL
4.0000 g | Freq: Four times a day (QID) | CUTANEOUS | Status: DC | PRN
  Administered 2023-05-16 – 2023-05-18 (×2): 4 g via TOPICAL
  Filled 2023-05-16 (×2): qty 100

## 2023-05-16 MED ORDER — ACETAMINOPHEN 325 MG PO TABS
650.0000 mg | ORAL_TABLET | Freq: Four times a day (QID) | ORAL | Status: DC | PRN
  Administered 2023-05-16 – 2023-05-17 (×3): 650 mg via ORAL
  Filled 2023-05-16 (×3): qty 2

## 2023-05-16 MED ORDER — BUSPIRONE HCL 5 MG PO TABS
10.0000 mg | ORAL_TABLET | Freq: Two times a day (BID) | ORAL | Status: DC
  Administered 2023-05-16 – 2023-05-20 (×9): 10 mg via ORAL
  Filled 2023-05-16 (×9): qty 2

## 2023-05-16 MED ORDER — ONDANSETRON HCL 4 MG PO TABS
4.0000 mg | ORAL_TABLET | Freq: Once | ORAL | Status: AC | PRN
  Administered 2023-05-16: 4 mg via ORAL
  Filled 2023-05-16: qty 1

## 2023-05-16 NOTE — ED Provider Notes (Signed)
[place] [facility] AT [facility] Provider Note   CSN: [id number] Arrival date & time: 05/15/23  2347     History  Chief Complaint  Patient presents with   Wound Infection    [name] is a 57 y.o. female.  Patient presents to the emergency department with concerns over foot infection.  Patient reports that she has a wound on the right foot.  She has a wound care nurse that visits and has been caring for it.  She reports that she had surgery several months ago.  Patient has not been feeling well, has been noticing swelling, redness that has been worsening over the last 4 days.  She presents with a fever of 100.9.  She thinks she has been having fever for a couple of days.       Home Medications Prior to Admission medications   Medication Sig Start Date End Date Taking? Authorizing Provider  Accu-Chek FastClix Lancets MISC USE TO CHECK BLOOD SUGAR TWICE DAILY 03/29/23   Atway, Rayann N, DO  ACCU-CHEK GUIDE test strip USE TO CHECK BLOOD SUGAR TWICE DAILY 03/29/23   Atway, Rayann N, DO  amLODipine (NORVASC) 10 MG tablet TAKE ONE TABLET BY MOUTH EVERY DAY 03/29/23   Atway, Rayann N, DO  atorvastatin (LIPITOR) 40 MG tablet Take 1 tablet (40 mg total) by mouth daily. 11/18/22   Atway, Derwood Kaplan, DO  Blood Glucose Monitoring Suppl (ACCU-CHEK GUIDE) w/Device KIT Use As Directed 09/15/22   Marolyn Haller, MD  busPIRone (BUSPAR) 10 MG tablet Take 1 tablet (10 mg total) by mouth 2 (two) times daily. 04/26/23 04/25/24  Nwoko, Tommas Olp, PA  COMFORT EZ PEN NEEDLES 31G X 8 MM MISC USE TO INJECT INSULIN IN THE MORNING, NOON, EVENING, AND BEDTIME 03/29/23   Atway, Rayann N, DO  Continuous Blood Gluc Receiver (FREESTYLE LIBRE 3 READER) DEVI 1 Device by Does not apply route continuous. Monitor sugars continuously 03/23/23   Merrilyn Puma, MD  Continuous Blood Gluc Sensor (FREESTYLE LIBRE 3 SENSOR) MISC Place new sensor every 14 days. Monitor sugars continuously. 03/23/23    Merrilyn Puma, MD  cyanocobalamin 1000 MCG tablet Take 1 tablet by mouth daily. 02/06/19   [provider]  cyclobenzaprine (FLEXERIL) 5 MG tablet TAKE ONE TABLET BY MOUTH THREE TIMES DAILY AS NEEDED FOR MUSCLE SPASMS Patient taking differently: Take 5 mg by mouth 3 (three) times daily as needed for muscle spasms. 09/02/22   Merrilyn Puma, MD  diclofenac Sodium (VOLTAREN) 1 % GEL APPLY 4 GRAMS TOPICALLY TO LEFT KNEE FOUR TIMES DAILY. Patient taking differently: Apply 4 g topically 4 (four) times daily as needed (Pain). Left knee 01/12/22   Merrilyn Puma, MD  escitalopram (LEXAPRO) 20 MG tablet Take 1 tablet (20 mg total) by mouth daily. 04/26/23   Nwoko, Tommas Olp, PA  fluticasone (CUTIVATE) 0.05 % cream Apply 1 Application topically 2 (two) times daily. 01/05/23   Atway, Rayann N, DO  gabapentin (NEURONTIN) 300 MG capsule Take 1 capsule (300 mg total) by mouth 2 (two) times daily. 11/17/22   Atway, Rayann N, DO  hydrALAZINE (APRESOLINE) 25 MG tablet Take 1 tablet (25 mg total) by mouth 3 (three) times daily. 11/18/22   Atway, Rayann N, DO  isosorbide mononitrate (IMDUR) 60 MG 24 hr tablet Take 1.5 tablets (90 mg total) by mouth daily. 03/11/23 04/10/23  Patel, Amar, DO  KLOR-CON M20 20 MEQ tablet TAKE ONE TABLET BY MOUTH DAILY Patient taking differently: Take 20 mEq by  mouth daily. 06/22/22   Laurey Morale, MD  LANTUS SOLOSTAR 100 UNIT/ML Solostar Pen INJECT 25 UNITS INTO THE SKIN EVERY DAY 03/29/23   Atway, Rayann N, DO  levETIRAcetam (KEPPRA) 500 MG tablet TAKE ONE TABLET BY MOUTH TWICE DAILY 03/29/23   Atway, Rayann N, DO  linaclotide (LINZESS) 290 MCG CAPS capsule Take 1 capsule (290 mcg total) by mouth daily as needed (Constipation). 03/29/23   Atway, Derwood Kaplan, DO  liver oil-zinc oxide (DESITIN) 40 % ointment Apply topically every 4 (four) hours. Patient taking differently: Apply 1 Application topically as needed for irritation. 09/15/22   Marolyn Haller, MD  metoprolol succinate  (TOPROL-XL) 100 MG 24 hr tablet Take 1 tablet (100 mg total) by mouth daily. 03/11/23 04/10/23  Modena Slater, DO  pantoprazole (PROTONIX) 40 MG tablet TAKE ONE TABLET BY MOUTH EVERY DAY 03/29/23   Atway, Rayann N, DO  rivaroxaban (XARELTO) 20 MG TABS tablet Take 1 tablet (20 mg total) by mouth daily with supper. 04/04/23   Atway, Rayann N, DO  spironolactone (ALDACTONE) 100 MG tablet TAKE ONE TABLET BY MOUTH EVERY DAY 03/29/23   Atway, Rayann N, DO  STOOL SOFTENER/LAXATIVE 50-8.6 MG tablet TAKE ONE TABLET BY MOUTH AT BEDTIME AS NEEDED FOR MILD CONSTIPATION Patient taking differently: Take 1 tablet by mouth at bedtime as needed for mild constipation. 01/28/20   Reymundo Poll, MD  traZODone (DESYREL) 100 MG tablet Take 1 tablet (100 mg total) by mouth at bedtime. 04/26/23   Nwoko, Uchenna E, PA  TRULICITY 0.75 MG/0.5ML SOPN INJECT 0.75 MG SUBCUTANEOUSLY ONCE WEEKLY 03/30/23   Atway, Rayann N, DO  VENTOLIN HFA 108 (90 Base) MCG/ACT inhaler INHALE TWO PUFFS INTO THE LUNGS EVERY 6 HOURS AS NEEDED FOR WHEEZING OR SHORTNESS OF BREATH 03/29/23   Atway, Rayann N, DO  Vitamin D, Ergocalciferol, (DRISDOL) 1.25 MG (50000 UNIT) CAPS capsule TAKE ONE CAPSULE BY MOUTH EVERY 7 DAYS 03/30/23   Atway, Rayann N, DO      Allergies    Ace inhibitors, Cleocin [clindamycin], Enalapril maleate, and Zestril [lisinopril]    Review of Systems   Review of Systems  Physical Exam Updated Vital Signs BP 128/66   Pulse 99   Temp (!) 100.9 F (38.3 C) (Oral)   Resp (!) 21   Ht 5\' 9"  (1.753 m)   Wt 102.5 kg   LMP 09/13/2019 (Exact Date)   SpO2 100%   BMI 33.37 kg/m  Physical Exam Vitals and nursing note reviewed.  Constitutional:      General: She is not in acute distress.    Appearance: She is well-developed.  HENT:     Head: Normocephalic and atraumatic.     Mouth/Throat:     Mouth: Mucous membranes are moist.  Eyes:     General: Vision grossly intact. Gaze aligned appropriately.     Extraocular Movements:  Extraocular movements intact.     Conjunctiva/sclera: Conjunctivae normal.  Cardiovascular:     Rate and Rhythm: Normal rate and regular rhythm.     Pulses: Normal pulses.     Heart sounds: Normal heart sounds, S1 normal and S2 normal. No murmur heard.    No friction rub. No gallop.  Pulmonary:     Effort: Pulmonary effort is normal. No respiratory distress.     Breath sounds: Normal breath sounds.  Abdominal:     General: Bowel sounds are normal.     Palpations: Abdomen is soft.     Tenderness: There is no abdominal tenderness. There  is no guarding or rebound.     Hernia: No hernia is present.  Musculoskeletal:        General: No swelling.     Cervical back: Full passive range of motion without pain, normal range of motion and neck supple. No spinous process tenderness or muscular tenderness. Normal range of motion.     Right lower leg: Swelling present. No edema.     Left lower leg: No edema.     Right foot: Swelling and tenderness present.  Skin:    General: Skin is warm and dry.     Capillary Refill: Capillary refill takes less than 2 seconds.     Findings: Erythema (R lower leg and foot) and wound (lateral right foot) present. No ecchymosis or rash.  Neurological:     General: No focal deficit present.     Mental Status: She is alert and oriented to person, place, and time.     GCS: GCS eye subscore is 4. GCS verbal subscore is 5. GCS motor subscore is 6.     Cranial Nerves: Cranial nerves 2-12 are intact.     Sensory: Sensation is intact.     Motor: Motor function is intact.     Coordination: Coordination is intact.  Psychiatric:        Attention and Perception: Attention normal.        Mood and Affect: Mood normal.        Speech: Speech normal.        Behavior: Behavior normal.        ED Results / Procedures / Treatments   Labs (all labs ordered are listed, but only abnormal results are displayed) Labs Reviewed  COMPREHENSIVE METABOLIC PANEL - Abnormal; Notable  for the following components:      Result Value   Sodium 131 (*)    Potassium 2.8 (*)    Chloride 94 (*)    Glucose, Bld 274 (*)    Creatinine, Ser 1.05 (*)    Calcium 8.6 (*)    Albumin 3.1 (*)    Total Bilirubin 2.2 (*)    All other components within normal limits  CBC WITH DIFFERENTIAL/PLATELET - Abnormal; Notable for the following components:   WBC 18.1 (*)    RBC 3.71 (*)    Hemoglobin 10.2 (*)    HCT 31.7 (*)    Neutro Abs 13.6 (*)    Monocytes Absolute 2.0 (*)    Abs Immature Granulocytes 0.15 (*)    All other components within normal limits  PROTIME-INR - Abnormal; Notable for the following components:   Prothrombin Time 21.1 (*)    INR 1.8 (*)    All other components within normal limits  APTT - Abnormal; Notable for the following components:   aPTT 46 (*)    All other components within normal limits  CULTURE, BLOOD (ROUTINE X 2)  CULTURE, BLOOD (ROUTINE X 2)  LACTIC ACID, PLASMA  LACTIC ACID, PLASMA  URINALYSIS, W/ REFLEX TO CULTURE (INFECTION SUSPECTED)  MAGNESIUM  PHOSPHORUS  COMPREHENSIVE METABOLIC PANEL    EKG EKG Interpretation  Date/Time:  Monday May 16 2023 00:11:45 EDT Ventricular Rate:  95 PR Interval:  124 QRS Duration: 145 QT Interval:  392 QTC Calculation: 493 R Axis:   95 Text Interpretation: Sinus rhythm Nonspecific intraventricular conduction delay Consider anterolateral infarct Confirmed by Gilda Crease [phone]) on 05/16/2023 12:19:31 AM  Radiology DG Foot Complete Right  Result Date: 05/16/2023 CLINICAL DATA:  Questionable sepsis - evaluate for abnormality  EXAM: RIGHT FOOT COMPLETE - 3+ VIEW COMPARISON:  Foot radiograph 04/12/2023 FINDINGS: Persistent decreased density involving the fifth metatarsal head with erosive change of the lateral cortex, suspicious for osteomyelitis. Overlying skin thickening and possible ulcer. There may be punctate radiopaque debris within the region of ulcer. No new sites suspicious for osteomyelitis.  No fracture. Dorsal soft tissue edema has increased. Chronic plantar calcaneal spur and Achilles tendon enthesophyte. IMPRESSION: 1. Persistent decreased density involving the fifth metatarsal head with erosive change of the lateral cortex, suspicious for osteomyelitis. Suspect overlying soft tissue defect/ulcer. There may be punctate radiopaque debris in the soft tissue defect. 2. Increased dorsal soft tissue edema. Electronically Signed   By: Narda Rutherford M.D.   On: 05/16/2023 00:54   DG Chest Port 1 View  Result Date: 05/16/2023 CLINICAL DATA:  Questionable sepsis - evaluate for abnormality EXAM: PORTABLE CHEST 1 VIEW COMPARISON:  09/12/2022 FINDINGS: Left-sided pacemaker remains in place. Mild cardiomegaly. Stable mediastinal contours. No focal airspace disease. Normal pulmonary vasculature. No pleural fluid or pneumothorax. No acute osseous findings. IMPRESSION: Mild cardiomegaly. No acute pulmonary process. Electronically Signed   By: Narda Rutherford M.D.   On: 05/16/2023 00:52    Procedures Procedures    Medications Ordered in ED Medications  cefTRIAXone (ROCEPHIN) 2 g in sodium chloride 0.9 % 100 mL IVPB (0 g Intravenous Stopped 05/16/23 0121)  metroNIDAZOLE (FLAGYL) IVPB 500 mg (0 mg Intravenous Stopped 05/16/23 0225)  vancomycin (VANCOREADY) IVPB 2000 mg/400 mL (2,000 mg Intravenous New Bag/Given 05/16/23 0227)  acetaminophen (TYLENOL) tablet 650 mg (has no administration in time range)    Or  acetaminophen (TYLENOL) suppository 650 mg (has no administration in time range)  polyethylene glycol (MIRALAX / GLYCOLAX) packet 17 g (has no administration in time range)    ED Course/ Medical Decision Making/ A&P                             Medical Decision Making Amount and/or Complexity of Data Reviewed External Data Reviewed: radiology and notes. Labs: ordered. Decision-making details documented in ED Course. Radiology: ordered and independent interpretation performed. Decision-making  details documented in ED Course. ECG/medicine tests: ordered and independent interpretation performed. Decision-making details documented in ED Course.  Risk Prescription drug management.   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  Wound appears worse than prior imaging in the record.  Sutures still in place, presumably from surgery in March.  These were removed as they are not holding anything and are a possible source for infection.  X-ray with bone destruction consistent with osteomyelitis.  She has an obvious cellulitis that is streaking up the leg, now to the knee.  Broad-spectrum antibiotic coverage.  She does have a leukocytosis but normal lactic acids, no hypotension.  No evidence of septic shock.  Broad-spectrum antibiotics initiated at arrival.  MRI ordered to evaluate level of osteomyelitis.  This cannot be performed until morning because she has an ICD.  Confirmed that the ICD is compatible with MRI, however.  Patient will be admitted by the internal medicine teaching service for further management.        Final Clinical Impression(s) / ED Diagnoses Final diagnoses:  Diabetic foot infection (HCC)    Rx / DC Orders ED Discharge Orders  None         Gilda Crease, MD 05/16/23 [id number]

## 2023-05-16 NOTE — ED Triage Notes (Signed)
Pt BIB EMS from home for a wound infection on the R foot, due for wound care tomorrow. Redness and warm to touch up to knee. Odorous and reports drainage from wound. Currently taking doxycycline.  EMS VS:  160/72 HR 104 93% RA RR 26 CBG 318

## 2023-05-16 NOTE — Hospital Course (Addendum)
Caitlyn ROGOFF is a 57 y.o. 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    Sepsis Right lower extremity cellulitis Right fifth metatarsal osteomyelitis Diabetic foot wound Presented with cellulitis of her right lower extremity extending up to her knee and due to failure of oral antibiotics for right fifth toe/metatarsal osteomyelitis.  She was placed on broad-spectrum antibiotics and then narrowed after surgery.  Blood cultures remain negative during admission at 3 days.  MRI showed osteomyelitis confined to the distal portion of the right first metatarsal.  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  She was discharged with doxycycline and cefadroxil to finish a 3-day course of antibiotics after surgery.  She will follow-up with Dr. Lajoyce Corners in his office for further wound care and will continue with outpatient PT and OT after refusing to go to a skilled nursing facility.   Hypertension Remained predominantly normotensive here after holding her amlodipine but continuing her other home antihypertensives.  She will stop taking amlodipine and may resume this if her blood pressure is high the amatory setting.   T2DM A1c 10.4 in 02/2023.  Home medications listed Trulicity 0.75 mg weekly and Lantus 25 units daily.  She did well on Semglee 25 units daily and sliding scale insulin.  We also continued her gabapentin 300 mg twice daily.  Paroxysmal A-fib on Xarelto Held Xarelto until about 36 hours after surgery.  She was kept on DVT prophylaxis Lovenox during that time.    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  Continued atorvastatin 40 mg daily, furosemide 40 mg daily, hydralazine 25 mg 3 times daily, Imdur 90 mg daily, 20 metoprolol succinate 50 mg daily, spironolactone 100 mg daily.   OSA CPAP at night   Seizures Keppra 500 mg twice daily   Anxiety and Depression Continue home medications of escitalopram 20 mg daily, buspirone 10 mg twice daily, trazodone 100 mg at bedtime   Housing Unfortunately patient was planning to move out of her rental housing situation on 05/20/2023, which is the day of discharge.  Unfortunately her landlord did not extend her time while she was in the hospital.  She was recommended to do acute rehab at a skilled nursing facility but refused to do this due to needing to figure this out and move out.  She will go live with a family member and then move into a hotel.

## 2023-05-16 NOTE — Progress Notes (Signed)
Bilateral lower extremity venous study completed.   Preliminary results relayed to RN.  Please see CV Procedures for preliminary results.  Camile Esters, RVT  12:36 PM 05/16/23

## 2023-05-16 NOTE — Progress Notes (Signed)
HD#0 Subjective:   Summary: [name] is a 57 y.o. 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   No change this morning after admission overnight.  She continues to have pain in her right foot and warmth of her right leg.  She also has pain in her bilateral calfs.  She denies any other new or worsening symptoms.  She is sad about her foot getting worse but understands the likely next step of amputation.  Objective:  Vital signs in last 24 hours: Vitals:   05/16/23 0415 05/16/23 0430 05/16/23 0432 05/16/23 0514  BP: (!) 145/75 123/66  126/67  Pulse: 97 (!) 103  94  Resp: (!) 25 16    Temp:   (!) 100.8 F (38.2 C) (!) 101.4 F (38.6 C)  TempSrc:   Oral Oral  SpO2: 92% 96%  93%  Weight:      Height:       Supplemental O2: Room Air SpO2: 93 %   Physical Exam:  Constitutional: Well-appearing middle-aged female laying in bed, in no acute distress Cardiovascular: regular rate and rhythm, no m/r/g Pulmonary/Chest: normal work of breathing on room air MSK: 1+ pitting edema of the lower extremities bilaterally with warmth and redness of the right lower extremity from the toes up to the knee with associated ulcer on the distal lateral fifth metatarsal Neurological: alert & oriented x 3  Filed Weights   05/16/23 0009  Weight: 102.5 kg     No intake or output data in the 24 hours ending 05/16/23 0720 Net IO Since Admission: No IO data has been entered for this period [05/16/23 0720]  Pertinent Labs:    Latest Ref Rng & Units 05/16/2023   12:28 AM 04/12/2023   10:37 AM 03/11/2023    3:51 AM  CBC  WBC 4.0 - 10.5 K/uL 18.1  9.0  14.7   Hemoglobin 12.0 - 15.0 g/dL  81.1  91.4  9.7   Hematocrit 36.0 - 46.0 % 31.7  34.6  30.4   Platelets 150 - 400 K/uL 165  184  287        Latest Ref Rng & Units 05/16/2023   12:28 AM 04/12/2023   10:37 AM 03/23/2023    4:18 PM  CMP  Glucose 70 - 99 mg/dL 782  956  97   BUN 6 - 20 mg/dL 6  12  7    Creatinine 0.44 - 1.00 mg/dL 2.13  0.86  5.78   Sodium 135 - 145 mmol/L 131  134  139   Potassium 3.5 - 5.1 mmol/L 2.8  4.0  3.8   Chloride 98 - 111 mmol/L 94  100  101   CO2 22 - 32 mmol/L 24  26  22    Calcium 8.9 - 10.3 mg/dL 8.6  8.9  9.6   Total Protein 6.5 - 8.1 g/dL 7.6  7.9    Total Bilirubin 0.3 - 1.2 mg/dL 2.2  0.8    Alkaline Phos 38 - 126 U/L 88  93    AST 15 - 41 U/L 17  16    ALT 0 - 44 U/L 15  13      Assessment/Plan:   Principal Problem:  Osteomyelitis of foot (HCC)   Patient Summary: [name] is a 57 y.o. 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   Sepsis Right lower extremity cellulitis Right fifth metatarsal osteomyelitis Diabetic foot wound Unfortunately oral antibiotics did not clear her infection and she now has overlying and progressing right lower extremity cellulitis.  Currently on vancomycin, cefepime, and metronidazole.  Orthopedics to see the patient this morning.  Will likely need IV antibiotics until cellulitis has vastly improved before consideration of amputation.  Tmax 103.2.  Fever has broken with Tylenol and antibiotics.  Hemodynamically stable this morning. - Continue vancomycin, cefepime, and metronidazole - Monitor blood cultures - Appreciate orthopedics evaluation and management - Lower extremity Dopplers  Hypertension Normotensive  here.  Will continue home medications below but hold amlodipine for now.  Will add back if pressures allow. - Hydralazine 25 mg 3 times daily, metoprolol succinate 50 mg daily, Imdur 90 mg daily, Spironolactone 100 mg daily  T2DM A1c 10.4 in 02/2023.  Home medications listed Trulicity 0.75 mg weekly and Lantus 25 units daily.  On sliding scale here and is used 16 units of short acting insulin with glucoses above 250. - Continue with sliding scale insulin and plan to place long-acting insulin tonight based on insulin usage today - Continue gabapentin 300 mg twice daily  Paroxysmal A-fib on Xarelto Holding Xarelto here and will keep on DVT prophylaxis with Lovenox until orthopedic evaluation.  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  Will continue home medications. - Atorvastatin 40 mg daily, furosemide 40 mg daily, hydralazine 25 mg 3 times daily, Imdur 90 mg daily, 20 metoprolol succinate 50 mg daily, spironolactone 100 mg daily  OSA CPAP at night  Seizures No sign of recurrent seizures.  Will continue Keppra 500 mg twice daily  Anxiety and Depression Continue home medications of escitalopram 20 mg daily, buspirone 10 mg twice daily, trazodone 100 mg at bedtime  Diet: Carb-Modified VTE: Enoxaparin Code: Full  Dispo: Anticipated discharge planning pending treatment of lower extremity cellulitis and orthopedic evaluation.  Rocky Morel, DO Internal Medicine Resident PGY-1 Pager: [phone]  Please contact the on call pager after 5 pm and on weekends at [phone].

## 2023-05-16 NOTE — Progress Notes (Signed)
[employer] rep, Dahlia Client,  at the bedside. She programmed the patient to DOO 95 bpm per order of cardiology PA. Patient is being monitored by a nursing staff member during the scan. Will program back to regular settings post MRI and send transmission.

## 2023-05-16 NOTE — ED Notes (Signed)
MD Pollina made aware of pacemaker and unable to go to MRI at this time.

## 2023-05-16 NOTE — Progress Notes (Signed)
Mobility Specialist Progress Note   05/16/23 1217  Mobility  Activity Transferred from chair to bed  Level of Assistance +2 (takes two people)  Assistive Device Other (Comment) (HHA)  Distance Ambulated (ft) 2 ft  Activity Response Tolerated well  Mobility Referral Yes  $Mobility charge 1 Mobility  Mobility Specialist Start Time (ACUTE ONLY) 1202  Mobility Specialist Stop Time (ACUTE ONLY) 1218  Mobility Specialist Time Calculation (min) (ACUTE ONLY) 16 min   Pt requesting to get back to bed but upon arrival pt already standing in room trying to transfer self back. HHA to help pt pivot to EOB, minA to get LE's back in bed and +2A to reposition pt. Left w/ call bell in reach and bed alarm on.   Frederico Hamman Mobility Specialist Please contact via SecureChat or  Rehab office at [phone]

## 2023-05-16 NOTE — Progress Notes (Signed)
Pharmacy Antibiotic Note  [name] is a 57 y.o. female admitted on 05/15/2023 with  diabetic foot infection/possible osteomyelitis .  The patient has noticed worsening swelling, redness in last four days. She reports drainage from the wound too. Pharmacy has been consulted for vancomycin dosing for 7 days. Tmax 100.9, WBC 18, sCr 1.05, lactate 1.1  Plan: Ceftriaxone 2g IV every 24 h per MD Flagyl 500 mg IV every 12h per MD Vancomycin 2g IV once Vancomycin 1250 mg IV every 24 hours (AUC 433, Vd 0.5, sCr 1.05) Follow up MRI Monitor renal function, WBC, fever curve, signs of clinical improvement, cultures  Height: 5\' 9"  (175.3 cm) Weight: 102.5 kg (226 lb) IBW/kg (Calculated) : 66.2  Temp (24hrs), Avg:100.9 F (38.3 C), Min:100.9 F (38.3 C), Max:100.9 F (38.3 C)  No results for input(s): "WBC", "CREATININE", "LATICACIDVEN", "VANCOTROUGH", "VANCOPEAK", "VANCORANDOM", "GENTTROUGH", "GENTPEAK", "GENTRANDOM", "TOBRATROUGH", "TOBRAPEAK", "TOBRARND", "AMIKACINPEAK", "AMIKACINTROU", "AMIKACIN" in the last 168 hours.  CrCl cannot be calculated (Patient's most recent lab result is older than the maximum 21 days allowed.).    Allergies  Allergen Reactions   Ace Inhibitors Anaphylaxis and Other (See Comments)    Swelling of the tongue and throat    Cleocin [Clindamycin] Anaphylaxis and Swelling   Enalapril Maleate Anaphylaxis and Other (See Comments)    Swelling of the tongue and throat    Zestril [Lisinopril] Anaphylaxis and Other (See Comments)    Swelling of the tongue and throat     Antimicrobials this admission: Ceftriaxone 6/2 >>  Flagyl 6/2 >>  Vancomycin 6/2 >>  Microbiology results: 6/2 BCx:   Thank you for allowing pharmacy to be a part of this patient's care.  Arabella Merles, PharmD. [facility] Acute Care PGY-1 05/16/2023 3:16 AM
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
## 2023-05-16 NOTE — ED Notes (Signed)
ED TO INPATIENT HANDOFF REPORT  ED Nurse Name and Phone #: Maralyn Sago [phone]  S Name/Age/Gender [name] 57 y.o. female Room/Bed: 035C/035C  Code Status   Code Status: Full Code  Home/SNF/Other Home Patient oriented to: self, place, time, and situation Is this baseline? Yes   Triage Complete: Triage complete  Chief Complaint Osteomyelitis of foot (HCC) [M86.9]  Triage Note Pt BIB EMS from home for a wound infection on the R foot, due for wound care tomorrow. Redness and warm to touch up to knee. Odorous and reports drainage from wound. Currently taking doxycycline.  EMS VS:  160/72 HR 104 93% RA RR 26 CBG 318   Allergies Allergies  Allergen Reactions   Ace Inhibitors Anaphylaxis and Other (See Comments)    Swelling of the tongue and throat    Cleocin [Clindamycin] Anaphylaxis and Swelling   Enalapril Maleate Anaphylaxis and Other (See Comments)    Swelling of the tongue and throat    Zestril [Lisinopril] Anaphylaxis and Other (See Comments)    Swelling of the tongue and throat     Level of Care/Admitting Diagnosis ED Disposition     ED Disposition  Admit   Condition  --   Comment  Hospital Area: [facility] [[id number]]  Level of Care: Med-Surg [[id number]]  May admit patient to [facility] or [facility] if equivalent level of care is available:: Yes  Covid Evaluation: Asymptomatic - no recent exposure (last 10 days) testing not required  Diagnosis: Osteomyelitis of foot [facility]) [[id number]]  Admitting Physician: [name] [[id number]]  Attending Physician: [name] [phone]  Certification:: I certify this patient will need inpatient services for at least 2 midnights  Estimated Length of Stay: 5          B Medical/Surgery History Past Medical History:  Diagnosis Date   Acute cystitis 06/19/2021   AKI (acute kidney injury) (HCC) 09/13/2022   AKI (acute kidney injury) (HCC) 09/13/2022   Allergic rhinitis    Arthritis    "hips, back,  legs, arms" (07/04/2014)   Asthma    hx   Automatic implantable cardioverter-defibrillator in situ    Calcifying tendinitis of shoulder    Chronic combined systolic and diastolic CHF (congestive heart failure) (HCC)    EF 40-45% by echo 12/06/2012   Chronic diastolic heart failure (HCC)     Primarily diastolic CHF: Likely due to uncontrolled HTN. Last echo (8/12) with EF 45-50%, mild to moderate LVH with some asymmetric septal hypertrophy, RV normal size and systolic function. EF 50-55% by LV-gram in 6/12.    Chronic lower back pain    secondary to DJD, obsetiy, hip problems. Followed by Dr. Ivory Broad (pain management)   Chronic systolic heart failure (HCC) 05/17/2022   Chronic use of opiate for therapeutic purpose 12/14/2016   Contact with and (suspected) exposure to covid-19 02/07/2020   Coronary artery disease    questionable. LHC 05/2011 showing normal coronaries // Followed at [facility], Dr. Shirlee Latch   Degeneration of lumbar or lumbosacral intervertebral disc    DJD (degenerative joint disease) of hip    right sided   Fatigue 04/27/2022   Frequent UTI    GERD (gastroesophageal reflux disease)    HLD (hyperlipidemia)    Hypertension    Poorly controlled. Has had HTN since age 36. Angioedema with ACEI.  24 Hr urine and renal arterial dopplers ordered . . . Never done   LBBB (left bundle branch block)    Left  shoulder pain 06/30/2015   Left spastic hemiparesis (HCC) 09/23/2015   Liver disease    Lumbago 11/22/2016   Morbid obesity (HCC)    Muscle spasm 06/19/2021   Need for pneumococcal vaccine 09/09/2017   NICM (nonischemic cardiomyopathy) (HCC)    EF 45-50% in 8/12, cath 6/12 showed normal coronaries, EF 50-55% by LV gram   OSA on CPAP    sleep study in 8/12 showed moderate to severe OSA requiring CPAP   Perimenopausal 03/28/2017   Polyneuropathy in diabetes(357.2)    Presence of permanent cardiac pacemaker    Rash 07/14/2016   Seizures (HCC)    last 3 months    Shortness of breath    none now   Stroke [facility]) 12/2013   "my left side is paralyzed" (07/04/2014)   Thoracic or lumbosacral neuritis or radiculitis, unspecified    Type II diabetes mellitus (HCC) DX: 2002   Urinary frequency 11/05/2014   Vaginal discharge 02/05/2021   Past Surgical History:  Procedure Laterality Date   BI-VENTRICULAR IMPLANTABLE CARDIOVERTER DEFIBRILLATOR N/A 08/22/2013   Procedure: BI-VENTRICULAR IMPLANTABLE CARDIOVERTER DEFIBRILLATOR  (CRT-D);  Surgeon: [name], MD;  Location: [facility] CATH LAB;  Service: Cardiovascular;  Laterality: N/A;   BI-VENTRICULAR IMPLANTABLE CARDIOVERTER DEFIBRILLATOR  (CRT-D)  08/2013   [name] 08/23/2013   BIV ICD GENERATOR CHANGEOUT N/A 03/18/2021   Procedure: BIV ICD GENERATOR CHANGEOUT;  Surgeon: [name], MD;  Location: [facility] INVASIVE CV LAB;  Service: Cardiovascular;  Laterality: N/A;   BREAST SURGERY Bilateral 2011   patient reports benign results   CARDIAC CATHETERIZATION  05/2011   CARPAL TUNNEL RELEASE Left    denies   HEMIARTHROPLASTY SHOULDER FRACTURE Right 1980's   denies   I & D EXTREMITY Right 03/06/2023   Procedure: IRRIGATION AND DEBRIDEMENT OF RIGHT FOOT;  Surgeon: [name], MD;  Location: [facility] OR;  Service: Orthopedics;  Laterality: Right;   I & D EXTREMITY Right 03/10/2023   Procedure: RIGHT FOOT INCISION AND DEBRIDEMENT;  Surgeon: [name], MD;  Location: [facility] OR;  Service: Orthopedics;  Laterality: Right;   MULTIPLE EXTRACTIONS WITH ALVEOLOPLASTY Bilateral 05/20/2017   Procedure: MULTIPLE EXTRACTION;  Surgeon: [name], DDS;  Location: [facility] OR;  Service: Oral Surgery;  Laterality: Bilateral;   MULTIPLE TOOTH EXTRACTIONS  ~ 2011   tumors removed ; "my whole top"   SHOULDER ARTHROSCOPY Right 12/26/2015   Procedure: Right Shoulder Arthroscopy, Debridement, and Decompression;  Surgeon: [name], MD;  Location: [facility] OR;  Service: Orthopedics;  Laterality: Right;   TEE WITHOUT CARDIOVERSION N/A 01/14/2014    Procedure: TRANSESOPHAGEAL ECHOCARDIOGRAM (TEE);  Surgeon: Thurmon Fair, MD;  Location: [facility] ENDOSCOPY;  Service: Cardiovascular;  Laterality: N/A;   TUBAL LIGATION  05/31/1985     A IV Location/Drains/Wounds Patient Lines/Drains/Airways Status     Active Line/Drains/Airways     Name Placement date Placement time Site Days   Peripheral IV 05/16/23 20 G Right Antecubital 05/16/23  0028  Antecubital  less than 1   Peripheral IV 05/16/23 20 G Anterior;Right Forearm 05/16/23  0041  Forearm  less than 1   External Urinary Catheter 05/16/23  0351  --  less than 1   Pressure Injury 09/13/22 Elbow Left;Posterior Deep Tissue Pressure Injury - Purple or maroon localized area of discolored intact skin or blood-filled blister due to damage of underlying soft tissue from pressure and/or shear. purple scabbed 09/13/22  1015  -- 245   Wound / Incision (Open or Dehisced) 03/05/23 Laceration Buttocks Left  pt scratched her butt so hard took first layer of skin off 03/05/23  --  Buttocks  72            Intake/Output Last 24 hours No intake or output data in the 24 hours ending 05/16/23 0402  Labs/Imaging Results for orders placed or performed during the hospital encounter of 05/15/23 (from the past 48 hour(s))  Lactic acid, plasma     Status: None   Collection Time: 05/16/23 12:28 AM  Result Value Ref Range   Lactic Acid, Venous 1.4 0.5 - 1.9 mmol/L    Comment: Performed at [facility], [address] [place], [place] [address]  Comprehensive metabolic panel     Status: Abnormal   Collection Time: 05/16/23 12:28 AM  Result Value Ref Range   Sodium 131 (L) 135 - 145 mmol/L   Potassium 2.8 (L) 3.5 - 5.1 mmol/L   Chloride 94 (L) 98 - 111 mmol/L   CO2 24 22 - 32 mmol/L   Glucose, Bld 274 (H) 70 - 99 mg/dL    Comment: Glucose reference range applies only to samples taken after fasting for at least 8 hours.   BUN 6 6 - 20 mg/dL   Creatinine, Ser 6.04 (H) 0.44 - 1.00 mg/dL   Calcium 8.6 (L)  8.9 - 10.3 mg/dL   Total Protein 7.6 6.5 - 8.1 g/dL   Albumin 3.1 (L) 3.5 - 5.0 g/dL   AST 17 15 - 41 U/L   ALT 15 0 - 44 U/L   Alkaline Phosphatase 88 38 - 126 U/L   Total Bilirubin 2.2 (H) 0.3 - 1.2 mg/dL   GFR, Estimated >54 >09 mL/min    Comment: (NOTE) Calculated using the CKD-EPI Creatinine Equation (2021)    Anion gap 13 5 - 15    Comment: Performed at [facility], [address], [place], [place] [address]  CBC with Differential     Status: Abnormal   Collection Time: 05/16/23 12:28 AM  Result Value Ref Range   WBC 18.1 (H) 4.0 - 10.5 K/uL   RBC 3.71 (L) 3.87 - 5.11 MIL/uL   Hemoglobin 10.2 (L) 12.0 - 15.0 g/dL   HCT 47.8 (L) 29.5 - 62.1 %   MCV 85.4 80.0 - 100.0 fL   MCH 27.5 26.0 - 34.0 pg   MCHC 32.2 30.0 - 36.0 g/dL   RDW 30.8 65.7 - 84.6 %   Platelets 165 150 - 400 K/uL   nRBC 0.0 0.0 - 0.2 %   Neutrophils Relative % 75 %   Neutro Abs 13.6 (H) 1.7 - 7.7 K/uL   Lymphocytes Relative 13 %   Lymphs Abs 2.4 0.7 - 4.0 K/uL   Monocytes Relative 11 %   Monocytes Absolute 2.0 (H) 0.1 - 1.0 K/uL   Eosinophils Relative 0 %   Eosinophils Absolute 0.0 0.0 - 0.5 K/uL   Basophils Relative 0 %   Basophils Absolute 0.0 0.0 - 0.1 K/uL   Immature Granulocytes 1 %   Abs Immature Granulocytes 0.15 (H) 0.00 - 0.07 K/uL    Comment: Performed at [facility], [address], [place], [place] [address]  Protime-INR     Status: Abnormal   Collection Time: 05/16/23 12:28 AM  Result Value Ref Range   Prothrombin Time 21.1 (H) 11.4 - 15.2 seconds   INR 1.8 (H) 0.8 - 1.2    Comment: (NOTE) INR goal varies based on device and disease states. Performed at [facility], [address], [place], [place] [address]   APTT     Status: Abnormal   Collection Time: 05/16/23 12:28 AM  Result Value Ref Range   aPTT 46 (H) 24 - 36 seconds    Comment:        IF BASELINE aPTT IS ELEVATED, SUGGEST PATIENT RISK ASSESSMENT BE USED TO DETERMINE APPROPRIATE ANTICOAGULANT  THERAPY. Performed at [facility], [address], [place], [place] [address]   Lactic acid, plasma     Status: None   Collection Time: 05/16/23  2:30 AM  Result Value Ref Range   Lactic Acid, Venous 1.1 0.5 - 1.9 mmol/L    Comment: Performed at [facility], [address], [place], [place] [address]   *Note: Due to a large number of results and/or encounters for the requested time period, some results have not been displayed. A complete set of results can be found in Results Review.   DG Foot Complete Right  Result Date: 05/16/2023 CLINICAL DATA:  Questionable sepsis - evaluate for abnormality EXAM: RIGHT FOOT COMPLETE - 3+ VIEW COMPARISON:  Foot radiograph 04/12/2023 FINDINGS: Persistent decreased density involving the fifth metatarsal head with erosive change of the lateral cortex, suspicious for osteomyelitis. Overlying skin thickening and possible ulcer. There may be punctate radiopaque debris within the region of ulcer. No new sites suspicious for osteomyelitis. No fracture. Dorsal soft tissue edema has increased. Chronic plantar calcaneal spur and Achilles tendon enthesophyte. IMPRESSION: 1. Persistent decreased density involving the fifth metatarsal head with erosive change of the lateral cortex, suspicious for osteomyelitis. Suspect overlying soft tissue defect/ulcer. There may be punctate radiopaque debris in the soft tissue defect. 2. Increased dorsal soft tissue edema. Electronically Signed   By: Narda Rutherford M.D.   On: 05/16/2023 00:54   DG Chest Port 1 View  Result Date: 05/16/2023 CLINICAL DATA:  Questionable sepsis - evaluate for abnormality EXAM: PORTABLE CHEST 1 VIEW COMPARISON:  09/12/2022 FINDINGS: Left-sided pacemaker remains in place. Mild cardiomegaly. Stable mediastinal contours. No focal airspace disease. Normal pulmonary vasculature. No pleural fluid or pneumothorax. No acute osseous findings. IMPRESSION: Mild cardiomegaly. No acute pulmonary process.  Electronically Signed   By: Narda Rutherford M.D.   On: 05/16/2023 00:52    Pending Labs Unresulted Labs (From admission, onward)     Start     Ordered   05/17/23 0500  CBC with Differential/Platelet  Tomorrow morning,   R        05/16/23 0311   05/16/23 0700  Magnesium  Once,   R        05/16/23 0311   05/16/23 0700  Phosphorus  Once,   R        05/16/23 0311   05/16/23 0700  Comprehensive metabolic panel  Once,   R        05/16/23 0311   05/16/23 0018  Blood Culture (routine x 2)  (Undifferentiated presentation (screening labs and basic nursing orders))  BLOOD CULTURE X 2,   STAT      05/16/23 0018   05/16/23 0018  Urinalysis, w/ Reflex to Culture (Infection Suspected) -Urine, Clean Catch  (Undifferentiated presentation (screening labs and basic nursing orders))  Once,   URGENT       Question:  Specimen Source  Answer:  Urine, Clean Catch   05/16/23 0018            Vitals/Pain Today's Vitals   05/16/23 0245 05/16/23 0300 05/16/23 0315 05/16/23 0342  BP: (!) 135/57 (!) 133/56 (!) 132/58   Pulse: 97 94 93   Resp: (!) 27 (!) 28 (!) 23   Temp:    (!) 103.2 F (39.6 C)  TempSrc:    Oral  SpO2: 93% 92% 93%   Weight:      Height:        Isolation Precautions No active isolations  Medications Medications  cefTRIAXone (ROCEPHIN) 2 g in sodium chloride 0.9 % 100 mL IVPB (0 g Intravenous Stopped 05/16/23 0121)  metroNIDAZOLE (FLAGYL) IVPB 500 mg (0 mg Intravenous Stopped 05/16/23 0225)  vancomycin (VANCOREADY) IVPB 2000 mg/400 mL (2,000 mg Intravenous New Bag/Given 05/16/23 0227)  acetaminophen (TYLENOL) tablet 650 mg (650 mg Oral Given 05/16/23 0350)    Or  acetaminophen (TYLENOL) suppository 650 mg ( Rectal See Alternative 05/16/23 0350)  polyethylene glycol (MIRALAX / GLYCOLAX) packet 17 g (has no administration in time range)  vancomycin (VANCOREADY) IVPB 1250 mg/250 mL (has no administration in time range)  potassium chloride SA (KLOR-CON M) CR tablet 40 mEq (40 mEq Oral Given  05/16/23 0352)    Mobility walks with device     Focused Assessments skin   R Recommendations: See Admitting Provider Note  Report given to:   Additional Notes: has purewick, a/o x 4

## 2023-05-17 ENCOUNTER — Ambulatory Visit (INDEPENDENT_AMBULATORY_CARE_PROVIDER_SITE_OTHER): Payer: Medicaid Other | Admitting: Podiatry

## 2023-05-17 DIAGNOSIS — R419 Unspecified symptoms and signs involving cognitive functions and awareness: Secondary | ICD-10-CM

## 2023-05-17 DIAGNOSIS — Z91199 Patient's noncompliance with other medical treatment and regimen due to unspecified reason: Secondary | ICD-10-CM

## 2023-05-17 DIAGNOSIS — L03115 Cellulitis of right lower limb: Secondary | ICD-10-CM | POA: Diagnosis not present

## 2023-05-17 DIAGNOSIS — E1169 Type 2 diabetes mellitus with other specified complication: Secondary | ICD-10-CM | POA: Diagnosis not present

## 2023-05-17 DIAGNOSIS — I5030 Unspecified diastolic (congestive) heart failure: Secondary | ICD-10-CM

## 2023-05-17 DIAGNOSIS — M86671 Other chronic osteomyelitis, right ankle and foot: Secondary | ICD-10-CM | POA: Diagnosis not present

## 2023-05-17 DIAGNOSIS — Z794 Long term (current) use of insulin: Secondary | ICD-10-CM

## 2023-05-17 DIAGNOSIS — I110 Hypertensive heart disease with heart failure: Secondary | ICD-10-CM | POA: Diagnosis not present

## 2023-05-17 DIAGNOSIS — F32A Depression, unspecified: Secondary | ICD-10-CM

## 2023-05-17 LAB — CBC WITH DIFFERENTIAL/PLATELET
Abs Immature Granulocytes: 0.15 10*3/uL — ABNORMAL HIGH (ref 0.00–0.07)
Basophils Absolute: 0 10*3/uL (ref 0.0–0.1)
Basophils Relative: 0 %
Eosinophils Absolute: 0.1 10*3/uL (ref 0.0–0.5)
Eosinophils Relative: 0 %
HCT: 30.3 % — ABNORMAL LOW (ref 36.0–46.0)
Hemoglobin: 9.4 g/dL — ABNORMAL LOW (ref 12.0–15.0)
Immature Granulocytes: 1 %
Lymphocytes Relative: 14 %
Lymphs Abs: 2.3 10*3/uL (ref 0.7–4.0)
MCH: 26.7 pg (ref 26.0–34.0)
MCHC: 31 g/dL (ref 30.0–36.0)
MCV: 86.1 fL (ref 80.0–100.0)
Monocytes Absolute: 1.4 10*3/uL — ABNORMAL HIGH (ref 0.1–1.0)
Monocytes Relative: 8 %
Neutro Abs: 12.9 10*3/uL — ABNORMAL HIGH (ref 1.7–7.7)
Neutrophils Relative %: 77 %
Platelets: 155 10*3/uL (ref 150–400)
RBC: 3.52 MIL/uL — ABNORMAL LOW (ref 3.87–5.11)
RDW: 13.3 % (ref 11.5–15.5)
WBC: 16.7 10*3/uL — ABNORMAL HIGH (ref 4.0–10.5)
nRBC: 0 % (ref 0.0–0.2)

## 2023-05-17 LAB — CULTURE, BLOOD (ROUTINE X 2): Special Requests: ADEQUATE

## 2023-05-17 LAB — GLUCOSE, CAPILLARY
Glucose-Capillary: 156 mg/dL — ABNORMAL HIGH (ref 70–99)
Glucose-Capillary: 137 mg/dL — ABNORMAL HIGH (ref 70–99)
Glucose-Capillary: 287 mg/dL — ABNORMAL HIGH (ref 70–99)
Glucose-Capillary: 190 mg/dL — ABNORMAL HIGH (ref 70–99)
Glucose-Capillary: 249 mg/dL — ABNORMAL HIGH (ref 70–99)
Glucose-Capillary: 279 mg/dL — ABNORMAL HIGH (ref 70–99)

## 2023-05-17 LAB — RENAL FUNCTION PANEL
Albumin: 2.5 g/dL — ABNORMAL LOW (ref 3.5–5.0)
Anion gap: 10 (ref 5–15)
BUN: 7 mg/dL (ref 6–20)
CO2: 23 mmol/L (ref 22–32)
Calcium: 8.1 mg/dL — ABNORMAL LOW (ref 8.9–10.3)
Chloride: 101 mmol/L (ref 98–111)
Creatinine, Ser: 1.02 mg/dL — ABNORMAL HIGH (ref 0.44–1.00)
GFR, Estimated: >60 mL/min (ref >60)
Glucose, Bld: 186 mg/dL — ABNORMAL HIGH (ref 70–99)
Phosphorus: 3.2 mg/dL (ref 2.5–4.6)
Potassium: 3 mmol/L — ABNORMAL LOW (ref 3.5–5.1)
Sodium: 134 mmol/L — ABNORMAL LOW (ref 135–145)

## 2023-05-17 LAB — SURGICAL PCR SCREEN
MRSA, PCR: NEGATIVE
Staphylococcus aureus: NEGATIVE

## 2023-05-17 LAB — MAGNESIUM: Magnesium: 1.9 mg/dL (ref 1.7–2.4)

## 2023-05-17 MED ORDER — ACETAMINOPHEN 500 MG PO TABS
1000.0000 mg | ORAL_TABLET | Freq: Three times a day (TID) | ORAL | Status: DC
  Administered 2023-05-17 – 2023-05-20 (×8): 1000 mg via ORAL
  Filled 2023-05-17 (×8): qty 2

## 2023-05-17 MED ORDER — OXYCODONE HCL 5 MG PO TABS
5.0000 mg | ORAL_TABLET | Freq: Once | ORAL | Status: AC | PRN
  Administered 2023-05-17: 5 mg via ORAL
  Filled 2023-05-17: qty 1

## 2023-05-17 MED ORDER — CEFAZOLIN SODIUM-DEXTROSE 2-4 GM/100ML-% IV SOLN: 2.0000 g | INTRAVENOUS | Status: DC

## 2023-05-17 MED ORDER — POVIDONE-IODINE 10 % EX SWAB
2.0000 | Freq: Once | CUTANEOUS | Status: AC
  Administered 2023-05-18: 2 via TOPICAL

## 2023-05-17 MED ORDER — CHLORHEXIDINE GLUCONATE 4 % EX SOLN
60.0000 mL | Freq: Once | CUTANEOUS | Status: AC
  Administered 2023-05-18: 4 via TOPICAL
  Filled 2023-05-17: qty 60

## 2023-05-17 MED ORDER — POTASSIUM CHLORIDE CRYS ER 20 MEQ PO TBCR
40.0000 meq | EXTENDED_RELEASE_TABLET | Freq: Two times a day (BID) | ORAL | Status: AC
  Administered 2023-05-17 (×2): 40 meq via ORAL
  Filled 2023-05-17 (×2): qty 2

## 2023-05-17 NOTE — Progress Notes (Signed)
Pt called concerning about the procedure to be scheduled tomorrow. RN tried to have the consent sign from the pt. However, the pt requested to see the doctor first before sign the consent. Will continue to monitor.

## 2023-05-17 NOTE — Inpatient Diabetes Management (Signed)
Inpatient Diabetes Program Recommendations  AACE/ADA: New Consensus Statement on Inpatient Glycemic Control (2015)  Target Ranges:  Prepandial:   less than 140 mg/dL      Peak postprandial:   less than 180 mg/dL (1-2 hours)      Critically ill patients:  140 - 180 mg/dL   Lab Results  Component Value Date   GLUCAP 287 (H) 05/17/2023   HGBA1C 10.4 (H) 03/06/2023    Review of Glycemic Control  Latest Reference Range & Units 05/16/23 19:58 05/17/23 04:06 05/17/23 07:32 05/17/23 11:41  Glucose-Capillary 70 - 99 mg/dL 161 (H) 096 (H) 045 (H) 287 (H)  (H): Data is abnormally high Diabetes history: Type 2 DM Outpatient Diabetes medications: Lantus 25 units QD, Trulicity 0.75 mg qwk, Novolog Current orders for Inpatient glycemic control: Semglee 25 units QD, Novolog 0-15 units Q4H  Inpatient Diabetes Program Recommendations:    Consider: - Increasing Semglee to 30 units QHS -Changing correction to TID & HS - Adding Novolog 4 units TID (Assuming patient consuming >50% of meals)  Thanks, Lujean Rave, MSN, RNC-OB Diabetes Coordinator [phone] (8a-5p)

## 2023-05-17 NOTE — Plan of Care (Signed)
  Problem: Education: Goal: Ability to describe self-care measures that may prevent or decrease complications (Diabetes Survival Skills Education) will improve Outcome: Progressing   Problem: Coping: Goal: Ability to adjust to condition or change in health will improve Outcome: Progressing   Problem: Fluid Volume: Goal: Ability to maintain a balanced intake and output will improve Outcome: Progressing   Problem: Health Behavior/Discharge Planning: Goal: Ability to identify and utilize available resources and services will improve Outcome: Progressing Goal: Ability to manage health-related needs will improve Outcome: Progressing   Problem: Metabolic: Goal: Ability to maintain appropriate glucose levels will improve Outcome: Progressing   Problem: Nutritional: Goal: Maintenance of adequate nutrition will improve Outcome: Progressing Goal: Progress toward achieving an optimal weight will improve Outcome: Progressing   Problem: Skin Integrity: Goal: Risk for impaired skin integrity will decrease Outcome: Progressing   Problem: Tissue Perfusion: Goal: Adequacy of tissue perfusion will improve Outcome: Progressing   Problem: Health Behavior/Discharge Planning: Goal: Ability to manage health-related needs will improve Outcome: Progressing   Problem: Clinical Measurements: Goal: Ability to maintain clinical measurements within normal limits will improve Outcome: Progressing Goal: Will remain free from infection Outcome: Progressing Goal: Diagnostic test results will improve Outcome: Progressing Goal: Respiratory complications will improve Outcome: Progressing Goal: Cardiovascular complication will be avoided Outcome: Progressing

## 2023-05-17 NOTE — Progress Notes (Signed)
HD#1 Subjective:   Summary: [name] is a 57 y.o. 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   Doing well this morning and thinks that her infection is getting better.  She did have a fever overnight and some associated chills but otherwise is overall feeling better.  She had a bowel movement yesterday.  Objective:  Vital signs in last 24 hours: Vitals:   05/16/23 1647 05/16/23 1956 05/17/23 0404 05/17/23 0636  BP: (!) 122/53 (!) 118/57 125/69   Pulse: 86 (!) 106 99   Resp:  18 20   Temp:  100.1 F (37.8 C) (!) 101.2 F (38.4 C) 99.8 F (37.7 C)  TempSrc:  Oral Oral Oral  SpO2: 98% 95% 91%   Weight:      Height:       Supplemental O2: Room Air SpO2: 91 %   Physical Exam:  Constitutional: Well-appearing middle-aged female laying in bed, in no acute distress Cardiovascular: regular rate and rhythm, no m/r/g Pulmonary/Chest: normal work of breathing on room air MSK: 1+ pitting edema of the lower extremities bilaterally with warmth and redness of the right lower extremity from the toes up to the mid shin with associated ulcer on the distal lateral fifth metatarsal.  Erythema improved from yesterday Neurological: alert & oriented x 3  Filed Weights   05/16/23 0009  Weight: 102.5 kg      Intake/Output Summary (Last 24 hours) at 05/17/2023 0639 Last data filed at 05/17/2023 0606 Gross per 24 hour  Intake --  Output 300 ml  Net -300 ml   Net IO Since Admission: -300 mL [05/17/23 0639]  Pertinent Labs:    Latest Ref Rng & Units 05/17/2023   12:44 AM 05/16/2023   12:28 AM 04/12/2023   10:37 AM  CBC  WBC 4.0 - 10.5 K/uL 16.7  18.1  9.0   Hemoglobin 12.0 -  15.0 g/dL 9.4  16.1  09.6   Hematocrit 36.0 - 46.0 % 30.3  31.7  34.6   Platelets 150 - 400 K/uL 155  165  184        Latest Ref Rng & Units 05/17/2023   12:44 AM 05/16/2023    9:10 AM 05/16/2023   12:28 AM  CMP  Glucose 70 - 99 mg/dL 045  409  811   BUN 6 - 20 mg/dL 7  7  6    Creatinine 0.44 - 1.00 mg/dL 9.14  7.82  9.56   Sodium 135 - 145 mmol/L 134  134  131   Potassium 3.5 - 5.1 mmol/L 3.0  2.8  2.8   Chloride 98 - 111 mmol/L 101  109  94   CO2 22 - 32 mmol/L 23  20  24    Calcium 8.9 - 10.3 mg/dL 8.1  7.2  8.6   Total Protein 6.5 - 8.1 g/dL  6.2  7.6   Total Bilirubin 0.3 - 1.2 mg/dL  1.6  2.2   Alkaline Phos 38 - 126 U/L  65  88   AST 15 - 41 U/L  10  17   ALT 0 - 44 U/L  12  15     Assessment/Plan:   Principal Problem:   Osteomyelitis  of foot [facility])   Patient Summary: [name] is a 57 y.o. female with pertinent PMH of HFrEF 2/2 NICM with CRT-D, T2DM, HTN, prior CVA with residual left-sided deficits, OSA, A-fib on Xarelto, and recent right fifth toe osteomyelitis who presented with fevers and signs of right foot cellulitis with nonhealing foot wound and is admitted for right foot cellulitis with underlying right fifth metatarsal osteomyelitis, on hospital day 1.   Sepsis Right lower extremity cellulitis Right fifth metatarsal osteomyelitis Diabetic foot wound Unfortunately oral antibiotics did not clear her infection and she now has overlying and progressing right lower extremity cellulitis.  Currently on vancomycin, cefepime, and metronidazole.  Orthopedics to see the patient.  Will likely need IV antibiotics until cellulitis has vastly improved before consideration of amputation.  Tmax 103.2.  Another fever overnight of 101.2 but broke this morning.  Hemodynamically stable this morning. - Continue vancomycin, cefepime, and metronidazole - Monitor blood cultures, no growth at less than 12 hours - Appreciate orthopedics evaluation and management - Lower extremity  Dopplers without evidence of DVT  Hypertension Normotensive here.  Will continue home medications below but hold amlodipine for now.  Will add back if pressures allow. - Hydralazine 25 mg 3 times daily, metoprolol succinate 50 mg daily, Imdur 90 mg daily, Spironolactone 100 mg daily  T2DM A1c 10.4 in 02/2023.  Home medications listed Trulicity 0.75 mg weekly and Lantus 25 units daily.  On sliding scale here and is used 16 units of short acting insulin with glucoses above 250. - Continue with sliding scale insulin and plan to place long-acting insulin tonight based on insulin usage today - Continue gabapentin 300 mg twice daily  Paroxysmal A-fib on Xarelto Holding Xarelto here and will keep on DVT prophylaxis with Lovenox until orthopedic evaluation.  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  Will continue home medications. - Atorvastatin 40 mg daily, furosemide 40 mg daily, hydralazine 25 mg 3 times daily, Imdur 90 mg daily, 20 metoprolol succinate 50 mg daily, spironolactone 100 mg daily  OSA CPAP at night  Seizures No sign of recurrent seizures.  Will continue Keppra 500 mg twice daily  Anxiety and Depression Continue home medications of escitalopram 20 mg daily, buspirone 10 mg twice daily, trazodone 100 mg at bedtime  Diet: Carb-Modified VTE: Enoxaparin Code: Full  Dispo: Anticipated discharge planning pending treatment of lower extremity cellulitis and orthopedic evaluation.  Rocky Morel, DO Internal Medicine Resident PGY-1 Pager: [phone]  Please contact the on call pager after 5 pm and on weekends at [phone].

## 2023-05-17 NOTE — Progress Notes (Signed)
1. No-show for appointment    Patient currently hospitalized. No charge.

## 2023-05-17 NOTE — Progress Notes (Signed)
Dr,McLendon on call notified that patient had cpmplaint fo nausea no vomiting. New order received.

## 2023-05-18 ENCOUNTER — Inpatient Hospital Stay (HOSPITAL_COMMUNITY): Payer: Medicaid Other | Admitting: Registered Nurse

## 2023-05-18 ENCOUNTER — Encounter (HOSPITAL_COMMUNITY)
Admission: EM | Disposition: A | Discharge status: Home Health | Payer: Self-pay | Source: Home / Self Care | Attending: Internal Medicine

## 2023-05-18 ENCOUNTER — Other Ambulatory Visit: Payer: Self-pay

## 2023-05-18 ENCOUNTER — Encounter (HOSPITAL_COMMUNITY): Payer: Self-pay | Admitting: Internal Medicine

## 2023-05-18 DIAGNOSIS — I110 Hypertensive heart disease with heart failure: Secondary | ICD-10-CM | POA: Diagnosis not present

## 2023-05-18 DIAGNOSIS — E1169 Type 2 diabetes mellitus with other specified complication: Secondary | ICD-10-CM

## 2023-05-18 DIAGNOSIS — M869 Osteomyelitis, unspecified: Secondary | ICD-10-CM

## 2023-05-18 DIAGNOSIS — I509 Heart failure, unspecified: Secondary | ICD-10-CM

## 2023-05-18 DIAGNOSIS — N189 Chronic kidney disease, unspecified: Secondary | ICD-10-CM

## 2023-05-18 DIAGNOSIS — L03115 Cellulitis of right lower limb: Secondary | ICD-10-CM | POA: Diagnosis not present

## 2023-05-18 DIAGNOSIS — M86671 Other chronic osteomyelitis, right ankle and foot: Secondary | ICD-10-CM | POA: Diagnosis not present

## 2023-05-18 DIAGNOSIS — I130 Hypertensive heart and chronic kidney disease with heart failure and stage 1 through stage 4 chronic kidney disease, or unspecified chronic kidney disease: Secondary | ICD-10-CM

## 2023-05-18 DIAGNOSIS — D631 Anemia in chronic kidney disease: Secondary | ICD-10-CM

## 2023-05-18 DIAGNOSIS — E1122 Type 2 diabetes mellitus with diabetic chronic kidney disease: Secondary | ICD-10-CM

## 2023-05-18 HISTORY — PX: AMPUTATION: SHX166

## 2023-05-18 LAB — CBC WITH DIFFERENTIAL/PLATELET
Abs Immature Granulocytes: 0.12 10*3/uL — ABNORMAL HIGH (ref 0.00–0.07)
Basophils Absolute: 0 10*3/uL (ref 0.0–0.1)
Basophils Relative: 0 %
Eosinophils Absolute: 0.2 10*3/uL (ref 0.0–0.5)
Eosinophils Relative: 1 %
HCT: 30.1 % — ABNORMAL LOW (ref 36.0–46.0)
Hemoglobin: 9.5 g/dL — ABNORMAL LOW (ref 12.0–15.0)
Immature Granulocytes: 1 %
Lymphocytes Relative: 15 %
Lymphs Abs: 2.1 10*3/uL (ref 0.7–4.0)
MCH: 27 pg (ref 26.0–34.0)
MCHC: 31.6 g/dL (ref 30.0–36.0)
MCV: 85.5 fL (ref 80.0–100.0)
Monocytes Absolute: 1 10*3/uL (ref 0.1–1.0)
Monocytes Relative: 7 %
Neutro Abs: 10.8 10*3/uL — ABNORMAL HIGH (ref 1.7–7.7)
Neutrophils Relative %: 76 %
Platelets: 184 10*3/uL (ref 150–400)
RBC: 3.52 MIL/uL — ABNORMAL LOW (ref 3.87–5.11)
RDW: 13.5 % (ref 11.5–15.5)
WBC: 14.2 10*3/uL — ABNORMAL HIGH (ref 4.0–10.5)
nRBC: 0 % (ref 0.0–0.2)

## 2023-05-18 LAB — GLUCOSE, CAPILLARY
Glucose-Capillary: 165 mg/dL — ABNORMAL HIGH (ref 70–99)
Glucose-Capillary: 216 mg/dL — ABNORMAL HIGH (ref 70–99)
Glucose-Capillary: 300 mg/dL — ABNORMAL HIGH (ref 70–99)
Glucose-Capillary: 146 mg/dL — ABNORMAL HIGH (ref 70–99)
Glucose-Capillary: 135 mg/dL — ABNORMAL HIGH (ref 70–99)
Glucose-Capillary: 133 mg/dL — ABNORMAL HIGH (ref 70–99)
Glucose-Capillary: 114 mg/dL — ABNORMAL HIGH (ref 70–99)
Glucose-Capillary: 109 mg/dL — ABNORMAL HIGH (ref 70–99)
Glucose-Capillary: 112 mg/dL — ABNORMAL HIGH (ref 70–99)
Glucose-Capillary: 153 mg/dL — ABNORMAL HIGH (ref 70–99)

## 2023-05-18 LAB — RENAL FUNCTION PANEL
Albumin: 2.3 g/dL — ABNORMAL LOW (ref 3.5–5.0)
Anion gap: 12 (ref 5–15)
BUN: 10 mg/dL (ref 6–20)
CO2: 23 mmol/L (ref 22–32)
Calcium: 7.9 mg/dL — ABNORMAL LOW (ref 8.9–10.3)
Chloride: 98 mmol/L (ref 98–111)
Creatinine, Ser: 1.09 mg/dL — ABNORMAL HIGH (ref 0.44–1.00)
GFR, Estimated: 60 mL/min — ABNORMAL LOW (ref >60)
Glucose, Bld: 279 mg/dL — ABNORMAL HIGH (ref 70–99)
Phosphorus: 2.9 mg/dL (ref 2.5–4.6)
Potassium: 3.2 mmol/L — ABNORMAL LOW (ref 3.5–5.1)
Sodium: 133 mmol/L — ABNORMAL LOW (ref 135–145)

## 2023-05-18 LAB — CULTURE, BLOOD (ROUTINE X 2)

## 2023-05-18 LAB — MAGNESIUM: Magnesium: 1.7 mg/dL (ref 1.7–2.4)

## 2023-05-18 SURGERY — AMPUTATION DIGIT
Anesthesia: Monitor Anesthesia Care | Site: Fifth Toe | Laterality: Right

## 2023-05-18 MED ORDER — MAGNESIUM CITRATE PO SOLN: 1.0000 | Freq: Once | ORAL | Status: DC | PRN

## 2023-05-18 MED ORDER — DOCUSATE SODIUM 100 MG PO CAPS
100.0000 mg | ORAL_CAPSULE | Freq: Every day | ORAL | Status: DC
  Administered 2023-05-19 – 2023-05-20 (×2): 100 mg via ORAL
  Filled 2023-05-18 (×2): qty 1

## 2023-05-18 MED ORDER — ACETAMINOPHEN 10 MG/ML IV SOLN: 1000.0000 mg | Freq: Once | INTRAVENOUS | Status: DC | PRN

## 2023-05-18 MED ORDER — JUVEN PO PACK
1.0000 | PACK | Freq: Two times a day (BID) | ORAL | Status: DC
  Administered 2023-05-19 – 2023-05-20 (×4): 1 via ORAL
  Filled 2023-05-18 (×4): qty 1

## 2023-05-18 MED ORDER — POTASSIUM CHLORIDE CRYS ER 20 MEQ PO TBCR: 20.0000 meq | EXTENDED_RELEASE_TABLET | Freq: Every day | ORAL | Status: DC | PRN

## 2023-05-18 MED ORDER — HYDRALAZINE HCL 20 MG/ML IJ SOLN: 5.0000 mg | INTRAMUSCULAR | Status: DC | PRN

## 2023-05-18 MED ORDER — AMISULPRIDE (ANTIEMETIC) 5 MG/2ML IV SOLN
10.0000 mg | Freq: Once | INTRAVENOUS | Status: AC
  Administered 2023-05-18: 10 mg via INTRAVENOUS

## 2023-05-18 MED ORDER — 0.9 % SODIUM CHLORIDE (POUR BTL) OPTIME
TOPICAL | Status: DC | PRN
  Administered 2023-05-18: 1000 mL

## 2023-05-18 MED ORDER — ACETAMINOPHEN 325 MG PO TABS: 325.0000 mg | ORAL_TABLET | Freq: Four times a day (QID) | ORAL | Status: DC | PRN

## 2023-05-18 MED ORDER — CHLORHEXIDINE GLUCONATE 0.12 % MT SOLN
OROMUCOSAL | Status: AC
  Administered 2023-05-18: 15 mL via OROMUCOSAL
  Filled 2023-05-18: qty 15

## 2023-05-18 MED ORDER — METOPROLOL TARTRATE 5 MG/5ML IV SOLN: 2.0000 mg | INTRAVENOUS | Status: DC | PRN

## 2023-05-18 MED ORDER — ALUM & MAG HYDROXIDE-SIMETH 200-200-20 MG/5ML PO SUSP: 15.0000 mL | ORAL | Status: DC | PRN

## 2023-05-18 MED ORDER — ONDANSETRON HCL 4 MG/2ML IJ SOLN: 4.0000 mg | Freq: Four times a day (QID) | INTRAMUSCULAR | Status: DC | PRN

## 2023-05-18 MED ORDER — LABETALOL HCL 5 MG/ML IV SOLN: 10.0000 mg | INTRAVENOUS | Status: DC | PRN

## 2023-05-18 MED ORDER — FENTANYL CITRATE (PF) 250 MCG/5ML IJ SOLN
INTRAMUSCULAR | Status: AC
  Filled 2023-05-18: qty 5

## 2023-05-18 MED ORDER — ONDANSETRON HCL 4 MG/2ML IJ SOLN
INTRAMUSCULAR | Status: DC | PRN
  Administered 2023-05-18: 4 mg via INTRAVENOUS

## 2023-05-18 MED ORDER — CHLORHEXIDINE GLUCONATE 0.12 % MT SOLN: 15.0000 mL | Freq: Once | OROMUCOSAL | Status: AC

## 2023-05-18 MED ORDER — FENTANYL CITRATE (PF) 100 MCG/2ML IJ SOLN: 25.0000 ug | INTRAMUSCULAR | Status: DC | PRN

## 2023-05-18 MED ORDER — PANTOPRAZOLE SODIUM 40 MG PO TBEC
40.0000 mg | DELAYED_RELEASE_TABLET | Freq: Every day | ORAL | Status: DC
  Administered 2023-05-18 – 2023-05-20 (×3): 40 mg via ORAL
  Filled 2023-05-18 (×3): qty 1

## 2023-05-18 MED ORDER — INSULIN ASPART 100 UNIT/ML IJ SOLN: 0.0000 [IU] | INTRAMUSCULAR | Status: DC | PRN

## 2023-05-18 MED ORDER — POLYETHYLENE GLYCOL 3350 17 G PO PACK: 17.0000 g | PACK | Freq: Every day | ORAL | Status: DC | PRN

## 2023-05-18 MED ORDER — PROPOFOL 10 MG/ML IV BOLUS
INTRAVENOUS | Status: DC | PRN
  Administered 2023-05-18: 20 mg via INTRAVENOUS
  Administered 2023-05-18: 50 ug/kg/min via INTRAVENOUS
  Administered 2023-05-18: 10 mg via INTRAVENOUS

## 2023-05-18 MED ORDER — GUAIFENESIN-DM 100-10 MG/5ML PO SYRP: 15.0000 mL | ORAL_SOLUTION | ORAL | Status: DC | PRN

## 2023-05-18 MED ORDER — MAGNESIUM SULFATE 2 GM/50ML IV SOLN: 2.0000 g | Freq: Every day | INTRAVENOUS | Status: DC | PRN

## 2023-05-18 MED ORDER — OXYCODONE HCL 5 MG PO TABS: 5.0000 mg | ORAL_TABLET | ORAL | Status: DC | PRN

## 2023-05-18 MED ORDER — POTASSIUM CHLORIDE 10 MEQ/100ML IV SOLN
10.0000 meq | INTRAVENOUS | Status: AC
  Administered 2023-05-18 (×4): 10 meq via INTRAVENOUS
  Filled 2023-05-18 (×4): qty 100

## 2023-05-18 MED ORDER — METRONIDAZOLE 500 MG PO TABS
500.0000 mg | ORAL_TABLET | Freq: Two times a day (BID) | ORAL | Status: DC
  Administered 2023-05-18: 500 mg via ORAL
  Filled 2023-05-18: qty 1

## 2023-05-18 MED ORDER — AMISULPRIDE (ANTIEMETIC) 5 MG/2ML IV SOLN
INTRAVENOUS | Status: AC
  Filled 2023-05-18: qty 4

## 2023-05-18 MED ORDER — ZINC SULFATE 220 (50 ZN) MG PO CAPS
220.0000 mg | ORAL_CAPSULE | Freq: Every day | ORAL | Status: DC
  Administered 2023-05-19 – 2023-05-20 (×2): 220 mg via ORAL
  Filled 2023-05-18 (×2): qty 1

## 2023-05-18 MED ORDER — HYDROMORPHONE HCL 1 MG/ML IJ SOLN
0.5000 mg | INTRAMUSCULAR | Status: DC | PRN
  Administered 2023-05-20: 1 mg via INTRAVENOUS
  Filled 2023-05-18: qty 1

## 2023-05-18 MED ORDER — FENTANYL CITRATE (PF) 100 MCG/2ML IJ SOLN: 50.0000 ug | Freq: Once | INTRAMUSCULAR | Status: DC

## 2023-05-18 MED ORDER — PHENOL 1.4 % MT LIQD: 1.0000 | OROMUCOSAL | Status: DC | PRN

## 2023-05-18 MED ORDER — SODIUM CHLORIDE 0.9 % IV SOLN: INTRAVENOUS | Status: DC

## 2023-05-18 MED ORDER — BISACODYL 5 MG PO TBEC: 5.0000 mg | DELAYED_RELEASE_TABLET | Freq: Every day | ORAL | Status: DC | PRN

## 2023-05-18 MED ORDER — PHENYLEPHRINE 80 MCG/ML (10ML) SYRINGE FOR IV PUSH (FOR BLOOD PRESSURE SUPPORT)
PREFILLED_SYRINGE | INTRAVENOUS | Status: DC | PRN
  Administered 2023-05-18: 160 ug via INTRAVENOUS

## 2023-05-18 MED ORDER — VITAMIN C 500 MG PO TABS
1000.0000 mg | ORAL_TABLET | Freq: Every day | ORAL | Status: DC
  Administered 2023-05-19 – 2023-05-20 (×2): 1000 mg via ORAL
  Filled 2023-05-18 (×2): qty 2

## 2023-05-18 MED ORDER — FENTANYL CITRATE (PF) 100 MCG/2ML IJ SOLN
INTRAMUSCULAR | Status: AC
  Administered 2023-05-18: 25 ug
  Filled 2023-05-18: qty 2

## 2023-05-18 MED ORDER — ACETAMINOPHEN 500 MG PO TABS: 1000.0000 mg | ORAL_TABLET | Freq: Once | ORAL | Status: DC | PRN

## 2023-05-18 MED ORDER — OXYCODONE HCL 5 MG PO TABS
10.0000 mg | ORAL_TABLET | ORAL | Status: DC | PRN
  Administered 2023-05-18: 15 mg via ORAL
  Administered 2023-05-19: 10 mg via ORAL
  Administered 2023-05-19: 15 mg via ORAL
  Filled 2023-05-18 (×2): qty 3
  Filled 2023-05-18: qty 2

## 2023-05-18 MED ORDER — ACETAMINOPHEN 160 MG/5ML PO SOLN: 1000.0000 mg | Freq: Once | ORAL | Status: DC | PRN

## 2023-05-18 MED ORDER — LACTATED RINGERS IV SOLN: INTRAVENOUS | Status: DC

## 2023-05-18 MED ORDER — ORAL CARE MOUTH RINSE: 15.0000 mL | Freq: Once | OROMUCOSAL | Status: AC

## 2023-05-18 SURGICAL SUPPLY — 34 items
BAG COUNTER SPONGE SURGICOUNT (BAG) ×1 IMPLANT
BLADE SURG 21 STRL SS (BLADE) ×1 IMPLANT
BNDG COHESIVE 4X5 TAN STRL (GAUZE/BANDAGES/DRESSINGS) ×1 IMPLANT
BNDG COHESIVE 6X5 TAN ST LF (GAUZE/BANDAGES/DRESSINGS) IMPLANT
BNDG ESMARK 4X9 LF (GAUZE/BANDAGES/DRESSINGS) IMPLANT
BNDG GAUZE DERMACEA FLUFF 4 (GAUZE/BANDAGES/DRESSINGS) ×1 IMPLANT
CANISTER PREVENA PLUS 150 (CANNISTER) IMPLANT
COVER SURGICAL LIGHT HANDLE (MISCELLANEOUS) ×2 IMPLANT
DRAPE DERMATAC (DRAPES) IMPLANT
DRAPE U-SHAPE 47X51 STRL (DRAPES) ×1 IMPLANT
DRESSING PEEL AND PLC PRVNA 13 (GAUZE/BANDAGES/DRESSINGS) IMPLANT
DRSG ADAPTIC 3X8 NADH LF (GAUZE/BANDAGES/DRESSINGS) IMPLANT
DRSG PEEL AND PLACE PREVENA 13 (GAUZE/BANDAGES/DRESSINGS) ×1
DURAPREP 26ML APPLICATOR (WOUND CARE) ×1 IMPLANT
ELECT REM PT RETURN 9FT ADLT (ELECTROSURGICAL) ×1
ELECTRODE REM PT RTRN 9FT ADLT (ELECTROSURGICAL) ×1 IMPLANT
GAUZE PAD ABD 8X10 STRL (GAUZE/BANDAGES/DRESSINGS) ×1 IMPLANT
GAUZE SPONGE 4X4 12PLY STRL (GAUZE/BANDAGES/DRESSINGS) IMPLANT
GLOVE BIOGEL PI IND STRL 9 (GLOVE) ×1 IMPLANT
GLOVE SURG ORTHO 9.0 STRL STRW (GLOVE) ×1 IMPLANT
GOWN STRL REUS W/ TWL XL LVL3 (GOWN DISPOSABLE) ×2 IMPLANT
GOWN STRL REUS W/TWL XL LVL3 (GOWN DISPOSABLE) ×2
GRAFT SKIN WND MICRO 38 (Tissue) IMPLANT
KIT BASIN OR (CUSTOM PROCEDURE TRAY) ×1 IMPLANT
KIT TURNOVER KIT B (KITS) ×1 IMPLANT
MANIFOLD NEPTUNE II (INSTRUMENTS) ×1 IMPLANT
NDL 22X1.5 STRL (OR ONLY) (MISCELLANEOUS) IMPLANT
NEEDLE 22X1.5 STRL (OR ONLY) (MISCELLANEOUS) IMPLANT
NS IRRIG 1000ML POUR BTL (IV SOLUTION) ×1 IMPLANT
PACK ORTHO EXTREMITY (CUSTOM PROCEDURE TRAY) ×1 IMPLANT
PAD ARMBOARD 7.5X6 YLW CONV (MISCELLANEOUS) ×2 IMPLANT
SUT ETHILON 2 0 PSLX (SUTURE) ×1 IMPLANT
SYR CONTROL 10ML LL (SYRINGE) IMPLANT
TOWEL GREEN STERILE (TOWEL DISPOSABLE) ×1 IMPLANT

## 2023-05-18 NOTE — Anesthesia Preprocedure Evaluation (Signed)
Anesthesia Evaluation  Patient identified by MRN, date of birth, ID band Patient awake    Reviewed: Allergy & Precautions, NPO status , Patient's Chart, lab work & pertinent test results  History of Anesthesia Complications Negative for: history of anesthetic complications  Airway Mallampati: III  TM Distance: >3 FB Neck ROM: Full    Dental  (+) Dental Advisory Given, Edentulous Upper, Edentulous Lower   Pulmonary shortness of breath, asthma , sleep apnea    breath sounds clear to auscultation       Cardiovascular hypertension, Pt. on medications + Peripheral Vascular Disease and +CHF  + pacemaker + Cardiac Defibrillator  Rhythm:Regular  1. Left ventricular ejection fraction, by estimation, is 25 to 30%. The  left ventricle has severely decreased function. The left ventricle  demonstrates global hypokinesis. There is moderate left ventricular  hypertrophy. Left ventricular diastolic  parameters are consistent with Grade I diastolic dysfunction (impaired  relaxation).   2. Right ventricular systolic function is mildly reduced. The right  ventricular size is normal. Tricuspid regurgitation signal is inadequate  for assessing PA pressure.   3. The mitral valve is normal in structure. No evidence of mitral valve  regurgitation.   4. The aortic valve is tricuspid. Aortic valve regurgitation is not  visualized. No aortic stenosis is present.   5. The inferior vena cava is normal in size with greater than 50%  respiratory variability, suggesting right atrial pressure of 3 mmHg.     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  Neuromuscular disease CVA, Residual Symptoms    GI/Hepatic ,GERD  Medicated,,  Endo/Other  diabetes, Type 2    Renal/GU CRFRenal diseaseLab Results      Component                Value               Date                       CREATININE               1.09 (H)            05/18/2023                Musculoskeletal  (+) Arthritis ,    Abdominal   Peds  Hematology  (+) Blood dyscrasia, anemia Lab Results      Component                Value               Date                      WBC                      14.2 (H)            05/18/2023                HGB                      9.5 (L)             05/18/2023                HCT  30.1 (L)            05/18/2023                MCV                      85.5                05/18/2023                PLT                      184                 05/18/2023              Anesthesia Other Findings   Reproductive/Obstetrics                             Anesthesia Physical Anesthesia Plan  ASA: 4  Anesthesia Plan: MAC and Regional   Post-op Pain Management: Regional block*   Induction:   PONV Risk Score and Plan: 2 and Propofol infusion and Treatment may vary due to age or medical condition  Airway Management Planned: Nasal Cannula and Natural Airway  Additional Equipment: None  Intra-op Plan:   Post-operative Plan:   Informed Consent: I have reviewed the patients History and Physical, chart, labs and discussed the procedure including the risks, benefits and alternatives for the proposed anesthesia with the patient or authorized representative who has indicated his/her understanding and acceptance.     Dental advisory given  Plan Discussed with: CRNA  Anesthesia Plan Comments:        Anesthesia Quick Evaluation

## 2023-05-18 NOTE — Progress Notes (Signed)
Insulin glargine was not available (wasn't able to find it), by the time it came up, pt was in OR. Spoke with MD who said to hold today's dose since it's late and BG-[id number]. Will give tomorrows on scheduled.

## 2023-05-18 NOTE — Op Note (Signed)
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 Local tissue rearrangement for wound closure 11 x 3 cm. Application Kerecis micro graft 38 cm. Application Prevena 13 cm wound VAC.  SURGEON:  [name], MD  PHYSICIAN ASSISTANT:None ANESTHESIA:   General  PREOPERATIVE INDICATIONS:  [name] is a  57 y.o. female with a diagnosis of Osteomyelitis Right 5th Metatarsal Head who failed conservative measures and elected for surgical management.    The risks benefits and alternatives were discussed with the patient preoperatively including but not limited to the risks of infection, bleeding, nerve injury, cardiopulmonary complications, the need for revision surgery, among others, and the patient was willing to proceed.  OPERATIVE IMPLANTS:   Implant Name Type Inv. Item Serial No. Manufacturer Lot No. LRB No. Used Action  GRAFT SKIN WND MICRO 38 - [id number] Tissue GRAFT SKIN WND MICRO 38  [employer] [id number] Right 1 Implanted    @ENCIMAGES @  OPERATIVE FINDINGS: Tissue margins were clear.  OPERATIVE PROCEDURE: Patient was brought the operating room after undergoing a regional anesthetic.  After adequate levels anesthesia were obtained patient's right lower extremity was prepped using DuraPrep draped into a sterile field a timeout was called.  A racquet incision was made around the ulcerative tissue along the fifth ray.  The ray was resected through the base of the fifth metatarsal.  21 blade knife was used to debride the tissue margins back to healthy viable tissue.  The wound was irrigated with normal saline electrocautery was used for hemostasis.  The wound measures 11 x 3 cm.  The wound bed was filled with 38 cm of Kerecis micro graft.  Local tissue rearrangement was used to close the wound 11 x 3 cm with 2-0 nylon.  The wound  edges were undermined to allow for closure.  The 13 cm Prevena wound VAC was applied this had a good suction fit.  Patient had venous stasis blistering involving the calf and Coban wrap was applied from the metatarsal heads to the tibial tubercle.  Patient was taken the PACU in stable condition.   DISCHARGE PLANNING:  Antibiotic duration: Continue antibiotics for 72 hours  Weightbearing: Touchdown weightbearing on the right  Pain medication: Opioid pathway  Dressing care/ Wound VAC: Continue wound VAC at discharge  Ambulatory devices: Walker  Discharge to: Anticipate discharge to home.  Follow-up: In the office 1 week post operative.

## 2023-05-18 NOTE — Progress Notes (Signed)
Orthopedic Tech Progress Note Patient Details:  [name] 15-May-1966 [id number]  Ortho Devices Type of Ortho Device: Postop shoe/boot Ortho Device/Splint Location: RLE Ortho Device/Splint Interventions: Ordered, Application, Adjustment, Removal   Post Interventions Patient Tolerated: Well Instructions Provided: Care of device  [name] 05/18/2023, 6:29 PM

## 2023-05-18 NOTE — Progress Notes (Addendum)
HD#2 Subjective:   Summary: [name] is a 57 y.o. 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   Doing well this morning and relieved that her surgery will be at a limitation and not transmetatarsal or BKA.  She slept very well and has no new or worsening complaints.  Objective:  Vital signs in last 24 hours: Vitals:   05/17/23 1504 05/17/23 1820 05/17/23 2051 05/18/23 0413  BP: (!) 116/47 124/66 118/66 118/64  Pulse: 84 87 94 77  Resp:    18  Temp: 99.1 F (37.3 C) 98.7 F (37.1 C) 98.5 F (36.9 C) 97.9 F (36.6 C)  TempSrc: Oral Oral Oral Oral  SpO2: 97% 96% 97% 93%  Weight:      Height:       Supplemental O2: Room Air SpO2: 93 %   Physical Exam:  Constitutional: Well-appearing middle-aged female laying in bed, in no acute distress Cardiovascular: regular rate and rhythm, no m/r/g Pulmonary/Chest: normal work of breathing on room air MSK: 1+ pitting edema of the lower extremities bilaterally with warmth and redness of the right lower extremity from the toes up to the mid shin with associated ulcer on the distal lateral fifth metatarsal.  Slightly improved from yesterday.   Neurological: alert & oriented x 3  Filed Weights   05/16/23 0009  Weight: 102.5 kg      Intake/Output Summary (Last 24 hours) at 05/18/2023 0738 Last data filed at 05/18/2023 0400 Gross per 24 hour  Intake 2076.58 ml  Output 900 ml  Net 1176.58 ml   Net IO Since Admission: 876.58 mL [05/18/23 0738]  Pertinent Labs:    Latest Ref Rng & Units 05/18/2023   12:32 AM 05/17/2023   12:44 AM 05/16/2023   12:28 AM  CBC  WBC 4.0 - 10.5 K/uL 14.2  16.7  18.1   Hemoglobin 12.0 - 15.0  g/dL 9.5  9.4  91.4   Hematocrit 36.0 - 46.0 % 30.1  30.3  31.7   Platelets 150 - 400 K/uL 184  155  165        Latest Ref Rng & Units 05/18/2023   12:32 AM 05/17/2023   12:44 AM 05/16/2023    9:10 AM  CMP  Glucose 70 - 99 mg/dL 782  956  213   BUN 6 - 20 mg/dL 10  7  7    Creatinine 0.44 - 1.00 mg/dL 0.86  5.78  4.69   Sodium 135 - 145 mmol/L 133  134  134   Potassium 3.5 - 5.1 mmol/L 3.2  3.0  2.8   Chloride 98 - 111 mmol/L 98  101  109   CO2 22 - 32 mmol/L 23  23  20    Calcium 8.9 - 10.3 mg/dL 7.9  8.1  7.2   Total Protein 6.5 - 8.1 g/dL   6.2   Total Bilirubin 0.3 - 1.2 mg/dL   1.6   Alkaline Phos 38 - 126 U/L   65   AST 15 - 41 U/L   10   ALT 0 - 44 U/L   12     Assessment/Plan:   Principal Problem:   Osteomyelitis of foot [facility])   Patient Summary:  [name] is a 57 y.o. 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   Sepsis Right lower extremity cellulitis Right fifth metatarsal osteomyelitis Diabetic foot wound Unfortunately oral antibiotics did not clear her infection and she now has overlying and progressing right lower extremity cellulitis.  Currently on vancomycin, cefepime, and metronidazole.  Orthopedics to see the patient.  Will likely need IV antibiotics until cellulitis has vastly improved before consideration of amputation.  Tmax 103.2.  Afebrile overnight. - Continue vancomycin, cefepime, and metronidazole - Monitor blood cultures, no growth at 1 day -Right fifth ray amputation today  Hypertension Normotensive here.  Will continue home medications below but hold amlodipine for now.  Will add back if pressures allow. -  Hydralazine 25 mg 3 times daily, metoprolol succinate 50 mg daily, Imdur 90 mg daily, Spironolactone 100 mg daily  T2DM A1c 10.4 in 02/2023.  Home medications listed Trulicity 0.75 mg weekly and Lantus 25 units daily.  - Semglee 25 units daily and SSI - Continue gabapentin 300 mg twice daily  Paroxysmal A-fib on Xarelto Holding Xarelto here and will keep on DVT prophylaxis with Lovenox until completed surgical course.  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  Will continue home medications. - Atorvastatin 40 mg daily, furosemide 40 mg daily, hydralazine 25 mg 3 times daily, Imdur 90 mg daily, 20 metoprolol succinate 50 mg daily, spironolactone 100 mg daily  OSA CPAP at night  Seizures Keppra 500 mg twice daily  Anxiety and Depression Continue home medications of escitalopram 20 mg daily, buspirone 10 mg twice daily, trazodone 100 mg at bedtime  Housing Notified today by the patient that she has to move out of her home rental on 05/20/2023, in 2 days.  She has notified her landlord but there is been no extension.  Will send a consult to [facility] for assistance.  Diet: Carb-Modified VTE: Enoxaparin Code: Full  Dispo: Anticipated discharge planning pending treatment of lower extremity cellulitis and orthopedic evaluation.  Rocky Morel, DO Internal Medicine Resident PGY-1 Pager: [phone]  Please contact the on call pager after 5 pm and on weekends at [phone].

## 2023-05-18 NOTE — Consult Note (Signed)
ORTHOPAEDIC CONSULTATION  REQUESTING PHYSICIAN: [name], MD  Chief Complaint: Ulceration with drainage fifth metatarsal head right foot.  HPI: [name] is a 57 y.o. female who presents with patient is a 57 year old woman who initially underwent debridement of the ulcer fifth metatarsal head right foot.  Patient has had progressive wound dehiscence with drainage and cellulitis.  Past Medical History:  Diagnosis Date   Acute cystitis 06/19/2021   AKI (acute kidney injury) (HCC) 09/13/2022   AKI (acute kidney injury) (HCC) 09/13/2022   Allergic rhinitis    Arthritis    "hips, back, legs, arms" (07/04/2014)   Asthma    hx   Automatic implantable cardioverter-defibrillator in situ    Calcifying tendinitis of shoulder    Chronic combined systolic and diastolic CHF (congestive heart failure) (HCC)    EF 40-45% by echo 12/06/2012   Chronic diastolic heart failure (HCC)     Primarily diastolic CHF: Likely due to uncontrolled HTN. Last echo (8/12) with EF 45-50%, mild to moderate LVH with some asymmetric septal hypertrophy, RV normal size and systolic function. EF 50-55% by LV-gram in 6/12.    Chronic lower back pain    secondary to DJD, obsetiy, hip problems. Followed by Dr. Ivory Broad (pain management)   Chronic systolic heart failure (HCC) 05/17/2022   Chronic use of opiate for therapeutic purpose 12/14/2016   Contact with and (suspected) exposure to covid-19 02/07/2020   Coronary artery disease    questionable. LHC 05/2011 showing normal coronaries // [facility], Dr. Shirlee Latch   Degeneration of lumbar or lumbosacral intervertebral disc    DJD (degenerative joint disease) of hip    right sided   Fatigue 04/27/2022   Frequent UTI    GERD (gastroesophageal reflux disease)    HLD (hyperlipidemia)    Hypertension    Poorly controlled. Has had HTN since age 81. Angioedema with ACEI.  24 Hr urine and renal arterial dopplers ordered . . . Never done    LBBB (left bundle branch block)    Left shoulder pain 06/30/2015   Left spastic hemiparesis (HCC) 09/23/2015   Liver disease    Lumbago 11/22/2016   Morbid obesity (HCC)    Muscle spasm 06/19/2021   Need for pneumococcal vaccine 09/09/2017   NICM (nonischemic cardiomyopathy) (HCC)    EF 45-50% in 8/12, cath 6/12 showed normal coronaries, EF 50-55% by LV gram   OSA on CPAP    sleep study in 8/12 showed moderate to severe OSA requiring CPAP   Perimenopausal 03/28/2017   Polyneuropathy in diabetes(357.2)    Presence of permanent cardiac pacemaker    Rash 07/14/2016   Seizures (HCC)    last 3 months   Shortness of breath    none now   Stroke [facility]) 12/2013   "my left side is paralyzed" (07/04/2014)   Thoracic or lumbosacral neuritis or radiculitis, unspecified    Type II diabetes mellitus (HCC) DX: 2002   Urinary frequency 11/05/2014   Vaginal discharge 02/05/2021   Past Surgical History:  Procedure Laterality Date   BI-VENTRICULAR IMPLANTABLE CARDIOVERTER DEFIBRILLATOR N/A 08/22/2013   Procedure: BI-VENTRICULAR IMPLANTABLE CARDIOVERTER DEFIBRILLATOR  (CRT-D);  Surgeon: [name], MD;  Location: [facility] CATH LAB;  Service: Cardiovascular;  Laterality: N/A;   BI-VENTRICULAR IMPLANTABLE CARDIOVERTER DEFIBRILLATOR  (CRT-D)  08/2013   [name] 08/23/2013   BIV ICD GENERATOR CHANGEOUT N/A 03/18/2021   Procedure: BIV ICD GENERATOR CHANGEOUT;  Surgeon: [name], MD;  Location: [facility] INVASIVE CV LAB;  Service: Cardiovascular;  Laterality: N/A;   BREAST SURGERY Bilateral 2011   patient reports benign results   CARDIAC CATHETERIZATION  05/2011   CARPAL TUNNEL RELEASE Left    denies   HEMIARTHROPLASTY SHOULDER FRACTURE Right 1980's   denies   I & D EXTREMITY Right 03/06/2023   Procedure: IRRIGATION AND DEBRIDEMENT OF RIGHT FOOT;  Surgeon: [name], MD;  Location: [facility] OR;  Service: Orthopedics;  Laterality: Right;   I & D EXTREMITY Right 03/10/2023   Procedure: RIGHT FOOT INCISION AND  DEBRIDEMENT;  Surgeon: [name], MD;  Location: [facility] OR;  Service: Orthopedics;  Laterality: Right;   MULTIPLE EXTRACTIONS WITH ALVEOLOPLASTY Bilateral 05/20/2017   Procedure: MULTIPLE EXTRACTION;  Surgeon: [name], DDS;  Location: [facility] OR;  Service: Oral Surgery;  Laterality: Bilateral;   MULTIPLE TOOTH EXTRACTIONS  ~ 2011   tumors removed ; "my whole top"   SHOULDER ARTHROSCOPY Right 12/26/2015   Procedure: Right Shoulder Arthroscopy, Debridement, and Decompression;  Surgeon: [name], MD;  Location: [facility] OR;  Service: Orthopedics;  Laterality: Right;   TEE WITHOUT CARDIOVERSION N/A 01/14/2014   Procedure: TRANSESOPHAGEAL ECHOCARDIOGRAM (TEE);  Surgeon: Thurmon Fair, MD;  Location: [facility] ENDOSCOPY;  Service: Cardiovascular;  Laterality: N/A;   TUBAL LIGATION  05/31/1985   Social History   Socioeconomic History   Marital status: Single    Spouse name: Not on file   Number of children: 2   Years of education: 9th grade   Highest education level: Not on file  Occupational History   Occupation: Unemployed    Comment: planning on getting disability  Tobacco Use   Smoking status: Never   Smokeless tobacco: Never  Vaping Use   Vaping Use: Never used  Substance and Sexual Activity   Alcohol use: No    Alcohol/week: 0.0 standard drinks of alcohol   Drug use: No   Sexual activity: Not on file  Other Topics Concern   Not on file  Social History Narrative   Lives in [place] with her son. Is able to read and write fluently in [place].   Social Determinants of Health   Financial Resource Strain: Medium Risk (03/23/2023)   Overall Financial Resource Strain (CARDIA)    Difficulty of Paying Living Expenses: Somewhat hard  Food Insecurity: Food Insecurity Present (03/23/2023)   Hunger Vital Sign    Worried About Running Out of Food in the Last Year: Often true    Ran Out of Food in the Last Year: Often true  Transportation Needs: Unmet Transportation Needs (03/23/2023)   PRAPARE  - Administrator, Civil Service (Medical): Yes    Lack of Transportation (Non-Medical): Yes  Physical Activity: Insufficiently Active (03/23/2023)   Exercise Vital Sign    Days of Exercise per Week: 3 days    Minutes of Exercise per Session: 20 min  Stress: Not on file  Social Connections: Moderately Isolated (03/23/2023)   Social Connection and Isolation Panel [NHANES]    Frequency of Communication with Friends and Family: More than three times a week    Frequency of Social Gatherings with Friends and Family: Three times a week    Attends Religious Services: More than 4 times per year    Active Member of Clubs or Organizations: No    Attends Banker Meetings: Never    Marital Status: Never married   Family History  Problem Relation Age of Onset   Heart disease Mother 89  Died of MI at age 9 yo   Kidney disease Mother        requiring dialysis   Congestive Heart Failure Mother    Heart disease Father 6       MI age 14yo requiring stenting   Diabetes Father    Glaucoma Father    Heart disease Paternal Grandmother        requiring pacemaker.   Heart disease Paternal Grandfather 59       Died of MI at possibly age 49-53yo   Stroke Paternal Grandfather    Diabetes Brother    Heart disease Brother 7       MI at age 62 years old   Breast cancer Paternal Aunt    Breast cancer Maternal Grandmother    - negative except otherwise stated in the family history section Allergies  Allergen Reactions   Ace Inhibitors Anaphylaxis and Other (See Comments)    Swelling of the tongue and throat    Cleocin [Clindamycin] Anaphylaxis and Swelling   Enalapril Maleate Anaphylaxis and Other (See Comments)    Swelling of the tongue and throat    Zestril [Lisinopril] Anaphylaxis and Other (See Comments)    Swelling of the tongue and throat    Prior to Admission medications   Medication Sig Start Date End Date Taking? Authorizing Provider  amLODipine (NORVASC) 10  MG tablet TAKE ONE TABLET BY MOUTH EVERY DAY 03/29/23  Yes Atway, Rayann N, DO  atorvastatin (LIPITOR) 40 MG tablet Take 1 tablet (40 mg total) by mouth daily. 11/18/22  Yes Atway, Rayann N, DO  busPIRone (BUSPAR) 10 MG tablet Take 1 tablet (10 mg total) by mouth 2 (two) times daily. 04/26/23 04/25/24 Yes Nwoko, Uchenna E, PA  cyanocobalamin 1000 MCG tablet Take 1 tablet by mouth daily. 02/06/19  Yes [provider]  cyclobenzaprine (FLEXERIL) 5 MG tablet TAKE ONE TABLET BY MOUTH THREE TIMES DAILY AS NEEDED FOR MUSCLE SPASMS Patient taking differently: Take 5 mg by mouth 3 (three) times daily as needed for muscle spasms. 09/02/22  Yes Merrilyn Puma, MD  diclofenac Sodium (VOLTAREN) 1 % GEL APPLY 4 GRAMS TOPICALLY TO LEFT KNEE FOUR TIMES DAILY. Patient taking differently: Apply 4 g topically 4 (four) times daily as needed (Pain). Left knee 01/12/22  Yes Merrilyn Puma, MD  escitalopram (LEXAPRO) 20 MG tablet Take 1 tablet (20 mg total) by mouth daily. 04/26/23  Yes Nwoko, Uchenna E, PA  fluticasone (CUTIVATE) 0.05 % cream Apply 1 Application topically 2 (two) times daily. 01/05/23  Yes Atway, Rayann N, DO  folic acid (FOLVITE) 1 MG tablet Take 1 mg by mouth daily.   Yes [provider]  furosemide (LASIX) 40 MG tablet Take 40 mg by mouth daily.   Yes [provider]  gabapentin (NEURONTIN) 300 MG capsule Take 1 capsule (300 mg total) by mouth 2 (two) times daily. 11/17/22  Yes Atway, Rayann N, DO  hydrALAZINE (APRESOLINE) 25 MG tablet Take 1 tablet (25 mg total) by mouth 3 (three) times daily. 11/18/22  Yes Atway, Rayann N, DO  ibuprofen (ADVIL) 200 MG tablet Take 400 mg by mouth 2 (two) times daily as needed for mild pain or headache.   Yes [provider]  insulin aspart (NOVOLOG) cartridge Inject into the skin. Use for Freestyle libre pump   Yes [provider]  KLOR-CON M20 20 MEQ tablet TAKE ONE TABLET BY MOUTH DAILY Patient taking differently: Take 20 mEq by  mouth daily. 06/22/22  Yes Shirlee Latch,  Eliot Ford, MD  LANTUS SOLOSTAR 100 UNIT/ML Solostar Pen INJECT 25 UNITS INTO THE SKIN EVERY DAY Patient taking differently: 25 Units daily. 03/29/23  Yes Atway, Rayann N, DO  levETIRAcetam (KEPPRA) 500 MG tablet TAKE ONE TABLET BY MOUTH TWICE DAILY 03/29/23  Yes Atway, Rayann N, DO  linaclotide (LINZESS) 290 MCG CAPS capsule Take 1 capsule (290 mcg total) by mouth daily as needed (Constipation). 03/29/23  Yes Atway, Rayann N, DO  liver oil-zinc oxide (DESITIN) 40 % ointment Apply topically every 4 (four) hours. Patient taking differently: Apply 1 Application topically as needed for irritation. 09/15/22  Yes Marolyn Haller, MD  metoprolol succinate (TOPROL-XL) 100 MG 24 hr tablet Take 1 tablet (100 mg total) by mouth daily. 03/11/23 05/16/23 Yes Patel, Amar, DO  pantoprazole (PROTONIX) 40 MG tablet TAKE ONE TABLET BY MOUTH EVERY DAY 03/29/23  Yes Atway, Rayann N, DO  rivaroxaban (XARELTO) 20 MG TABS tablet Take 1 tablet (20 mg total) by mouth daily with supper. 04/04/23  Yes Atway, Rayann N, DO  spironolactone (ALDACTONE) 100 MG tablet TAKE ONE TABLET BY MOUTH EVERY DAY 03/29/23  Yes Atway, Rayann N, DO  STOOL SOFTENER/LAXATIVE 50-8.6 MG tablet TAKE ONE TABLET BY MOUTH AT BEDTIME AS NEEDED FOR MILD CONSTIPATION Patient taking differently: Take 1 tablet by mouth at bedtime as needed for mild constipation. 01/28/20  Yes Reymundo Poll, MD  traZODone (DESYREL) 100 MG tablet Take 1 tablet (100 mg total) by mouth at bedtime. 04/26/23  Yes Nwoko, Uchenna E, PA  VENTOLIN HFA 108 (90 Base) MCG/ACT inhaler INHALE TWO PUFFS INTO THE LUNGS EVERY 6 HOURS AS NEEDED FOR WHEEZING OR SHORTNESS OF BREATH Patient taking differently: 1-2 puffs every 6 (six) hours as needed for wheezing or shortness of breath. 03/29/23  Yes Atway, Rayann N, DO  Accu-Chek FastClix Lancets MISC USE TO CHECK BLOOD SUGAR TWICE DAILY 03/29/23   Atway, Rayann N, DO  ACCU-CHEK GUIDE test strip USE TO CHECK BLOOD SUGAR  TWICE DAILY 03/29/23   Atway, Rayann N, DO  Blood Glucose Monitoring Suppl (ACCU-CHEK GUIDE) w/Device KIT Use As Directed 09/15/22   Marolyn Haller, MD  COMFORT EZ PEN NEEDLES 31G X 8 MM MISC USE TO INJECT INSULIN IN THE MORNING, NOON, EVENING, AND BEDTIME 03/29/23   Atway, Rayann N, DO  Continuous Blood Gluc Receiver (FREESTYLE LIBRE 3 READER) DEVI 1 Device by Does not apply route continuous. Monitor sugars continuously 03/23/23   Merrilyn Puma, MD  Continuous Blood Gluc Sensor (FREESTYLE LIBRE 3 SENSOR) MISC Place new sensor every 14 days. Monitor sugars continuously. 03/23/23   Merrilyn Puma, MD  isosorbide mononitrate (IMDUR) 60 MG 24 hr tablet Take 1.5 tablets (90 mg total) by mouth daily. 03/11/23 04/10/23  Patel, Azucena Cecil, DO  TRULICITY 0.75 MG/0.5ML SOPN INJECT 0.75 MG SUBCUTANEOUSLY ONCE WEEKLY Patient taking differently: Inject into the skin every Wednesday. 03/30/23   Atway, Rayann N, DO  Vitamin D, Ergocalciferol, (DRISDOL) 1.25 MG (50000 UNIT) CAPS capsule TAKE ONE CAPSULE BY MOUTH EVERY 7 DAYS 03/30/23   Chauncey Mann, DO   MR FOOT RIGHT WO CONTRAST  Result Date: 05/16/2023 CLINICAL DATA:  Soft tissue infection suspected, rule out osteomyelitis. Osteopenia about the fifth metatarsal head. EXAM: MRI OF THE RIGHT FOREFOOT WITHOUT CONTRAST TECHNIQUE: Multiplanar, multisequence MR imaging of the right forefoot was performed. No intravenous contrast was administered. COMPARISON:  Radiographs dated May 16, 2019 FINDINGS: Bones/Joint/Cartilage Bone marrow edema of the head of the fifth metatarsal and phalanges of the fifth digit concerning for  osteomyelitis. Marrow signal within remaining osseous structures is within normal limits, evaluation is however somewhat limited due to multiple sequences are degraded due to motion. Ligaments Lisfranc ligament is intact. Collateral ligaments appear maintained. Muscles and Tendons Increased intramuscular signal of the plantar muscles suggesting diabetic  myopathy/myositis. Soft tissues Deep skin wound with surrounding edema and inflammatory changes about the head of the fifth metatarsal. Marked subcutaneous soft tissue edema about the dorsum/lateral aspect of the foot. IMPRESSION: 1. Bone marrow edema of the head of the fifth metatarsal and phalanges of the fifth digit concerning for osteomyelitis. 2. Deep skin wound with surrounding edema and inflammatory changes about the head of the fifth metatarsal. 3. Increased intramuscular signal of the plantar muscles suggesting diabetic myopathy/myositis. 4. Soft tissue edema about the dorsum of the foot suggesting cellulitis. Electronically Signed   By: Larose Hires D.O.   On: 05/16/2023 18:04   VAS [place] LOWER EXTREMITY VENOUS (DVT)  Result Date: 05/16/2023  Lower Venous DVT Study Patient Name:  [name]  Date of Exam:   05/16/2023 Medical Rec #: [id number]         Accession #:    [id number] Date of Birth: [birth year]         Patient Gender: F Patient Age:   60 years Exam Location:  [facility] Procedure:      VAS [place] LOWER EXTREMITY VENOUS (DVT) Referring Phys: EMILY MULLEN --------------------------------------------------------------------------------  Indications: Pain, and Swelling.  Limitations: Body habitus and poor ultrasound/tissue interface. Comparison Study: No previous study. Performing Technologist: McKayla Maag RVT, VT  Examination Guidelines: A complete evaluation includes B-mode imaging, spectral Doppler, color Doppler, and power Doppler as needed of all accessible portions of each vessel. Bilateral testing is considered an integral part of a complete examination. Limited examinations for reoccurring indications may be performed as noted. The reflux portion of the exam is performed with the patient in reverse Trendelenburg.  +---------+---------------+---------+-----------+----------+-------------------+ RIGHT    CompressibilityPhasicitySpontaneityPropertiesThrombus Aging       +---------+---------------+---------+-----------+----------+-------------------+ CFV      Full           Yes      Yes                                      +---------+---------------+---------+-----------+----------+-------------------+ SFJ      Full                                                             +---------+---------------+---------+-----------+----------+-------------------+ FV Prox  Full                                                             +---------+---------------+---------+-----------+----------+-------------------+ FV Mid   Full                                                             +---------+---------------+---------+-----------+----------+-------------------+  FV DistalFull                                                             +---------+---------------+---------+-----------+----------+-------------------+ PFV      Full                                                             +---------+---------------+---------+-----------+----------+-------------------+ POP      Full           Yes      Yes                                      +---------+---------------+---------+-----------+----------+-------------------+ PTV                                                   Not well visualized +---------+---------------+---------+-----------+----------+-------------------+ PERO                                                  Not well visualized +---------+---------------+---------+-----------+----------+-------------------+   +--------+---------------+---------+-----------+----------+--------------------+ LEFT    CompressibilityPhasicitySpontaneityPropertiesThrombus Aging       +--------+---------------+---------+-----------+----------+--------------------+ CFV                    Yes      Yes                  Patent by color                                                           doppler               +--------+---------------+---------+-----------+----------+--------------------+ SFJ                    Yes      Yes                  Patnet by color                                                           doppler              +--------+---------------+---------+-----------+----------+--------------------+ FV Prox  Not well visualized  +--------+---------------+---------+-----------+----------+--------------------+ FV Mid  Full                                                              +--------+---------------+---------+-----------+----------+--------------------+ FV      Full                                                              Distal                                                                    +--------+---------------+---------+-----------+----------+--------------------+ PFV                                                  Not well visualized  +--------+---------------+---------+-----------+----------+--------------------+ POP     Full           Yes      Yes                                       +--------+---------------+---------+-----------+----------+--------------------+ PTV                                                  Not well visualized  +--------+---------------+---------+-----------+----------+--------------------+ PERO                                                 Not well visualized  +--------+---------------+---------+-----------+----------+--------------------+     Summary: RIGHT: - There is no evidence of deep vein thrombosis in the lower extremity. However, portions of this examination were limited- see technologist comments above.  - No cystic structure found in the popliteal fossa. - Ultrasound characteristics of enlarged lymph nodes are noted in the groin.  LEFT: - There is no evidence of deep vein thrombosis in the lower extremity. However, portions of this  examination were limited- see technologist comments above.  - No cystic structure found in the popliteal fossa.  *See table(s) above for measurements and observations. Electronically signed by Lemar Livings MD on 05/16/2023 at 4:41:28 PM.    Final    - pertinent xrays, CT, MRI studies were reviewed and independently interpreted  Positive ROS: All other systems have been reviewed and were otherwise negative with the exception of those mentioned in the HPI and as above.  Physical Exam: General: Alert, no acute distress Psychiatric: Patient is competent for consent with  normal mood and affect Lymphatic: No axillary or cervical lymphadenopathy Cardiovascular: No pedal edema Respiratory: No cyanosis, no use of accessory musculature GI: No organomegaly, abdomen is soft and non-tender    Images:  @ENCIMAGES @  Labs:  Lab Results  Component Value Date   HGBA1C 10.4 (H) 03/06/2023   HGBA1C 13.3 (H) 09/13/2022   HGBA1C 9.5 (A) 02/16/2022   REPTSTATUS PENDING 05/16/2023   GRAMSTAIN  03/06/2023    FEW GRAM POSITIVE COCCI IN PAIRS RARE WBC PRESENT, PREDOMINANTLY PMN    CULT  05/16/2023    NO GROWTH 1 DAY Performed at [facility], [address], [place], [place] [address]    [facility] STAPHYLOCOCCUS LUGDUNENSIS 03/06/2023   LABORGA STAPHYLOCOCCUS WARNERI 03/06/2023    Lab Results  Component Value Date   ALBUMIN 2.3 (L) 05/18/2023   ALBUMIN 2.5 (L) 05/17/2023   ALBUMIN 2.3 (L) 05/16/2023   PREALBUMIN 17 06/11/2015        Latest Ref Rng & Units 05/18/2023   12:32 AM 05/17/2023   12:44 AM 05/16/2023   12:28 AM  CBC EXTENDED  WBC 4.0 - 10.5 K/uL 14.2  16.7  18.1   RBC 3.87 - 5.11 MIL/uL 3.52  3.52  3.71   Hemoglobin 12.0 - 15.0 g/dL 9.5  9.4  60.4   HCT 54.0 - 46.0 % 30.1  30.3  31.7   Platelets 150 - 400 K/uL 184  155  165   NEUT# 1.7 - 7.7 K/uL 10.8  12.9  13.6   Lymph# 0.7 - 4.0 K/uL 2.1  2.3  2.4     Neurologic: Patient does not have protective sensation bilateral lower  extremities.   MUSCULOSKELETAL:   Skin: Examination patient has an ulcer 2 cm in diameter over the lateral border fifth metatarsal head right foot.  The ulcer probes to bone.  There is nonviable tissue within the base of the wound.  Ulcer probes to bone.  Patient has a palpable dorsalis pedis pulse.  White cell count 14.2 with a hemoglobin of 9.5.  Albumin 2.3.  Hemoglobin A1c most recently 10.4.  Ultrasound negative for DVT.  MRI scan shows osteomyelitis of the fifth metatarsal head.  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  Plan: Plan: Will plan for a right foot fifth ray amputation today.  Risk and benefits were discussed including risk of the wound not healing need for additional surgery.  The importance of nonweightbearing on the forefoot.  Patient states she understands wished to proceed with surgery at this time.  Thank you for the consult and the opportunity to see Ms. Pete Glatter, MD [facility] [phone] 7:51 AM

## 2023-05-18 NOTE — Transfer of Care (Signed)
Immediate Anesthesia Transfer of Care Note  Patient: [name]  Procedure(s) Performed: RIGHT FOTT 5TH TOE AMPUTATION (Right: Fifth Toe)  Patient Location: PACU  Anesthesia Type:MAC  Level of Consciousness: awake  Airway & Oxygen Therapy: Patient Spontanous Breathing  Post-op Assessment: Report given to RN and Post -op Vital signs reviewed and stable  Post vital signs: Reviewed and stable  Last Vitals:  Vitals Value Taken Time  BP 98/48 05/18/23 1627  Temp    Pulse 76 05/18/23 1629  Resp 22 05/18/23 1629  SpO2 94 % 05/18/23 1629  Vitals shown include unvalidated device data.  Last Pain:  Vitals:   05/18/23 1358  TempSrc: Oral  PainSc: 9          Complications: No notable events documented.

## 2023-05-19 ENCOUNTER — Encounter (HOSPITAL_COMMUNITY): Payer: Self-pay | Admitting: Orthopedic Surgery

## 2023-05-19 DIAGNOSIS — I110 Hypertensive heart disease with heart failure: Secondary | ICD-10-CM | POA: Diagnosis not present

## 2023-05-19 DIAGNOSIS — L03115 Cellulitis of right lower limb: Secondary | ICD-10-CM | POA: Diagnosis not present

## 2023-05-19 DIAGNOSIS — M86671 Other chronic osteomyelitis, right ankle and foot: Secondary | ICD-10-CM | POA: Diagnosis not present

## 2023-05-19 DIAGNOSIS — E1169 Type 2 diabetes mellitus with other specified complication: Secondary | ICD-10-CM | POA: Diagnosis not present

## 2023-05-19 LAB — RENAL FUNCTION PANEL
Albumin: 2.2 g/dL — ABNORMAL LOW (ref 3.5–5.0)
Anion gap: 11 (ref 5–15)
BUN: 10 mg/dL (ref 6–20)
CO2: 23 mmol/L (ref 22–32)
Calcium: 8.2 mg/dL — ABNORMAL LOW (ref 8.9–10.3)
Chloride: 100 mmol/L (ref 98–111)
Creatinine, Ser: 1.02 mg/dL — ABNORMAL HIGH (ref 0.44–1.00)
GFR, Estimated: >60 mL/min (ref >60)
Glucose, Bld: 183 mg/dL — ABNORMAL HIGH (ref 70–99)
Phosphorus: 3.2 mg/dL (ref 2.5–4.6)
Potassium: 3.8 mmol/L (ref 3.5–5.1)
Sodium: 134 mmol/L — ABNORMAL LOW (ref 135–145)

## 2023-05-19 LAB — CBC WITH DIFFERENTIAL/PLATELET
Abs Immature Granulocytes: 0.11 10*3/uL — ABNORMAL HIGH (ref 0.00–0.07)
Basophils Absolute: 0 10*3/uL (ref 0.0–0.1)
Basophils Relative: 0 %
Eosinophils Absolute: 0.2 10*3/uL (ref 0.0–0.5)
Eosinophils Relative: 2 %
HCT: 28.5 % — ABNORMAL LOW (ref 36.0–46.0)
Hemoglobin: 8.9 g/dL — ABNORMAL LOW (ref 12.0–15.0)
Immature Granulocytes: 1 %
Lymphocytes Relative: 17 %
Lymphs Abs: 2.2 10*3/uL (ref 0.7–4.0)
MCH: 26.8 pg (ref 26.0–34.0)
MCHC: 31.2 g/dL (ref 30.0–36.0)
MCV: 85.8 fL (ref 80.0–100.0)
Monocytes Absolute: 1 10*3/uL (ref 0.1–1.0)
Monocytes Relative: 7 %
Neutro Abs: 9.6 10*3/uL — ABNORMAL HIGH (ref 1.7–7.7)
Neutrophils Relative %: 73 %
Platelets: 206 10*3/uL (ref 150–400)
RBC: 3.32 MIL/uL — ABNORMAL LOW (ref 3.87–5.11)
RDW: 13.7 % (ref 11.5–15.5)
WBC: 13.1 10*3/uL — ABNORMAL HIGH (ref 4.0–10.5)
nRBC: 0 % (ref 0.0–0.2)

## 2023-05-19 LAB — GLUCOSE, CAPILLARY
Glucose-Capillary: 159 mg/dL — ABNORMAL HIGH (ref 70–99)
Glucose-Capillary: 171 mg/dL — ABNORMAL HIGH (ref 70–99)
Glucose-Capillary: 178 mg/dL — ABNORMAL HIGH (ref 70–99)
Glucose-Capillary: 179 mg/dL — ABNORMAL HIGH (ref 70–99)
Glucose-Capillary: 233 mg/dL — ABNORMAL HIGH (ref 70–99)
Glucose-Capillary: 279 mg/dL — ABNORMAL HIGH (ref 70–99)

## 2023-05-19 MED ORDER — LIDOCAINE-EPINEPHRINE (PF) 1.5 %-1:200000 IJ SOLN
INTRAMUSCULAR | Status: DC | PRN
  Administered 2023-05-18: 5 mL via PERINEURAL

## 2023-05-19 MED ORDER — BUPIVACAINE-EPINEPHRINE (PF) 0.5% -1:200000 IJ SOLN
INTRAMUSCULAR | Status: DC | PRN
  Administered 2023-05-18: 25 mL via PERINEURAL

## 2023-05-19 NOTE — Progress Notes (Signed)
Patient ID: [name], female   DOB: 1966/01/22, 57 y.o.   MRN: [id number] Patient is a 57 year old woman postop day 1 right foot fifth ray amputation.  Patient is resting comfortably this morning.  There is 50 cc in the wound VAC canister.  Plan for progressive ambulation minimize weightbearing on the right foot.  Patient will discharge with the Praveena plus portable pump I will follow-up in the office in 1 week.

## 2023-05-19 NOTE — Anesthesia Postprocedure Evaluation (Signed)
Anesthesia Post Note  Patient: [name]  Procedure(s) Performed: RIGHT FOTT 5TH TOE AMPUTATION (Right: Fifth Toe)     Patient location during evaluation: PACU Anesthesia Type: Regional and MAC Level of consciousness: awake and alert Pain management: pain level controlled Vital Signs Assessment: post-procedure vital signs reviewed and stable Respiratory status: spontaneous breathing, nonlabored ventilation and respiratory function stable Cardiovascular status: blood pressure returned to baseline and stable Postop Assessment: no apparent nausea or vomiting Anesthetic complications: no   No notable events documented.  Last Vitals:  Vitals:   05/19/23 0430 05/19/23 0827  BP: 116/65 114/74  Pulse: 86 69  Resp: 18 17  Temp: 36.9 C 36.4 C  SpO2: 91% 95%    Last Pain:  Vitals:   05/19/23 0827  TempSrc: Oral  PainSc:                  [name]

## 2023-05-19 NOTE — Progress Notes (Signed)
Pharmacy Antibiotic Note  RIVERLYNN FLACK is a 57 y.o. female admitted on 05/15/2023 5th toe osteomyelitis s/p amputation. Pharmacy consulted for vancomycin dosing. Also on cefepime.  Per Ortho, continue abx 3 days post op = 6/8.  Will hold on levels given end date soon and renal function stable.   Plan: Vancomycin 1250mg  q24hr Cefepime 2g q8hr End date 6/8 entered   Height: 5\' 9"  (175.3 cm) Weight: 102.5 kg (226 lb) IBW/kg (Calculated) : 66.2  Temp (24hrs), Avg:98.1 F (36.7 C), Min:96.9 F (36.1 C), Max:98.7 F (37.1 C)  Recent Labs  Lab 05/16/23 0028 05/16/23 0230 05/16/23 0910 05/17/23 0044 05/18/23 0032 05/19/23 0126  WBC 18.1*  --   --  16.7* 14.2* 13.1*  CREATININE 1.05*  --  0.87 1.02* 1.09* 1.02*  LATICACIDVEN 1.4 1.1  --   --   --   --     Estimated Creatinine Clearance: 78.5 mL/min (A) (by C-G formula based on SCr of 1.02 mg/dL (H)).    Allergies  Allergen Reactions   Ace Inhibitors Anaphylaxis and Other (See Comments)    Swelling of the tongue and throat    Cleocin [Clindamycin] Anaphylaxis and Swelling   Enalapril Maleate Anaphylaxis and Other (See Comments)    Swelling of the tongue and throat    Zestril [Lisinopril] Anaphylaxis and Other (See Comments)    Swelling of the tongue and throat     Antimicrobials this admission: Ceftriaxone 6/2 Cefe 6/2 >> 6/8 Flagyl 6/2 >> 6/6 Vancomycin 6/2 >> 6/8  Microbiology results: 6/2 BCx: ngtd  Thank you for allowing pharmacy to be a part of this patient's care. Alphia Moh, PharmD, BCPS, BCCP Clinical Pharmacist  Please check AMION for all [facility] Pharmacy phone numbers After 10:00 PM, call Main Pharmacy [phone]

## 2023-05-19 NOTE — Anesthesia Procedure Notes (Signed)
Anesthesia Regional Block: Popliteal block   Pre-Anesthetic Checklist: , timeout performed,  Correct Patient, Correct Site, Correct Laterality,  Correct Procedure, Correct Position, site marked,  Risks and benefits discussed,  Surgical consent,  Pre-op evaluation,  At surgeon's request and post-op pain management  Laterality: Right and Lower  Prep: chloraprep       Needles:  Injection technique: Single-shot      Needle Length: 9cm  Needle Gauge: 22     Additional Needles: Arrow StimuQuik ECHO Echogenic Stimulating PNB Needle  Procedures:,,,, ultrasound used (permanent image in chart),,    Narrative:  Start time: 05/18/2023 3:22 PM End time: 05/18/2023 3:29 PM Injection made incrementally with aspirations every 5 mL.  Performed by: Personally  Anesthesiologist: Val Eagle, MD

## 2023-05-19 NOTE — Progress Notes (Signed)
Physical Therapy Evaluation Patient Details Name: [name] MRN: [id number] DOB: 1966/04/03 Today's Date: 05/19/2023  History of Present Illness  57 yo female with onset of being unable to walk on R foot was admitted 6/2, found osteomyelitis on R foot 5th metatarsal with cellulitis.  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  PMHx:  HLD, CVA affecting L side, ICD, HTN CHF, a fib, Dm2, chest pain, a-fib, OSA, seizures.  Clinical Impression  Pt was seen for attempts to stand from lower surface as pt's nurses got her to the chair.  Pt had been on [facility] and then to recliner, with currently orders at [facility] for RLE.  Contacted surgeon who gave permission for Heel wb on RLE for TRANSFERS ONLY and otherwise TDWB only.  Minimizing WB is encouraged by doctor.  Follow acutely for goals of PT and focus on transfers as pt was mainly getting bed to power chair at home.  Recommend <3 hours a day PT to recover strength and control of LLE to stand.        Recommendations for follow up therapy are one component of a multi-disciplinary discharge planning process, led by the attending physician.  Recommendations may be updated based on patient status, additional functional criteria and insurance authorization.  Follow Up Recommendations Can patient physically be transported by private vehicle: No     Assistance Recommended at Discharge Frequent or constant Supervision/Assistance  Patient can return home with the following  Two people to help with walking and/or transfers;Two people to help with bathing/dressing/bathroom;Assistance with cooking/housework;Assist for transportation;Help with stairs or ramp for entrance    Equipment Recommendations None recommended by PT  Recommendations for Other Services       Functional Status Assessment Patient has had a recent decline in their functional status and demonstrates the ability to make significant improvements in function in a reasonable and  predictable amount of time.     Precautions / Restrictions Precautions Precautions: Fall Precaution Comments: R foot 5th metatarsal amp Restrictions Weight Bearing Restrictions: Yes RLE Weight Bearing: Touchdown weight bearing      Mobility  Bed Mobility               General bed mobility comments: up in recliner but leaning back with mod assist to sit up    Transfers Overall transfer level: Needs assistance Equipment used: Left platform walker, Rolling walker (2 wheels), 1 person hand held assist Transfers: Sit to/from Stand Sit to Stand: Total assist           General transfer comment: Pt had been assisted to chair with help from two but with TDWB and L hemiparesis could not stand up for PT without loading some wgt on RLE.  MD gave permission to WB on heel for transfers only after session    Ambulation/Gait               General Gait Details: deferred  Stairs            Wheelchair Mobility    Modified Rankin (Stroke Patients Only)       Balance Overall balance assessment: Needs assistance Sitting-balance support: Feet supported Sitting balance-Leahy Scale: Fair   Postural control: Right lateral lean, Posterior lean Standing balance support: Single extremity supported, During functional activity Standing balance-Leahy Scale: Zero Standing balance comment: unable to get up on her feet due to L hemi change and R TWB  Pertinent Vitals/Pain Pain Assessment Pain Assessment: Faces Faces Pain Scale: Hurts a little bit Pain Location: R foot Pain Descriptors / Indicators: Guarding Pain Intervention(s): Monitored during session, Repositioned, Limited activity within patient's tolerance, Premedicated before session    Home Living Family/patient expects to be discharged to:: Private residence Living Arrangements: Other relatives;Children Available Help at Discharge: Friend(s);Available  PRN/intermittently;Personal care attendant Type of Home: House Home Access: Ramped entrance       Home Layout: One level Home Equipment: Shower seat;BSC/3in1;Toilet riser;Cane - quad;Wheelchair - manual;Wheelchair - power Additional Comments: pt is being evicted from this living situation by tomorrow 6/7    Prior Function Prior Level of Function : Needs assist       Physical Assist : Mobility (physical) Mobility (physical): Bed mobility;Transfers;Gait   Mobility Comments: quad cane or HW       Hand Dominance   Dominant Hand: Right    Extremity/Trunk Assessment   Upper Extremity Assessment Upper Extremity Assessment: LUE deficits/detail LUE Deficits / Details: hemiparesis with closed grip, requires one handed device or platform LUE: Unable to fully assess due to immobilization LUE Coordination: decreased gross motor;decreased fine motor    Lower Extremity Assessment Lower Extremity Assessment: RLE deficits/detail;LLE deficits/detail RLE Deficits / Details: NWB on RLE after 5th metatarsal amp RLE Coordination: decreased gross motor LLE Deficits / Details: L side hemiparesis LLE Coordination: decreased gross motor (and PF contracture with AFO used normally for foot drop)    Cervical / Trunk Assessment Cervical / Trunk Assessment: Normal  Communication   Communication: No difficulties  Cognition Arousal/Alertness: Awake/alert Behavior During Therapy: WFL for tasks assessed/performed Overall Cognitive Status: Within Functional Limits for tasks assessed                                 General Comments: pt is concerned about her pending move out of her current residence and does not know where she will go after rehab        General Comments General comments (skin integrity, edema, etc.): Pt was assisted to try to stand but with L side in PF contracture and R TDWB could not generate standing, nursing had used some wb on R foot.  Per mission obtained for  Heel wb on RLE to support transfers only    Exercises     Assessment/Plan    PT Assessment Patient needs continued PT services  PT Problem List Decreased strength;Decreased range of motion;Decreased activity tolerance;Decreased balance;Decreased mobility;Decreased coordination;Decreased knowledge of use of DME;Decreased safety awareness;Decreased skin integrity;Pain;Decreased knowledge of precautions       PT Treatment Interventions DME instruction;Gait training;Functional mobility training;Therapeutic activities;Therapeutic exercise;Balance training;Neuromuscular re-education;Patient/family education    PT Goals (Current goals can be found in the Care Plan section)  Acute Rehab PT Goals Patient Stated Goal: to get back to home with power chair PT Goal Formulation: With patient Time For Goal Achievement: 06/02/23 Potential to Achieve Goals: Fair    Frequency Min 4X/week     Co-evaluation               AM-PAC PT "6 Clicks" Mobility  Outcome Measure Help needed turning from your back to your side while in a flat bed without using bedrails?: A Lot Help needed moving from lying on your back to sitting on the side of a flat bed without using bedrails?: A Lot Help needed moving to and from a bed to a chair (including a wheelchair)?: Total Help  needed standing up from a chair using your arms (e.g., wheelchair or bedside chair)?: Total Help needed to walk in hospital room?: Total Help needed climbing 3-5 steps with a railing? : Total 6 Click Score: 8    End of Session Equipment Utilized During Treatment: Gait belt Activity Tolerance: Patient limited by fatigue;Treatment limited secondary to medical complications (Comment) Patient left: in chair;with call bell/phone within reach;with chair alarm set;with family/visitor present Nurse Communication: Mobility status PT Visit Diagnosis: Muscle weakness (generalized) (M62.81);Difficulty in walking, not elsewhere classified  (R26.2);Other abnormalities of gait and mobility (R26.89)    Time: 1610-9604 PT Time Calculation (min) (ACUTE ONLY): 34 min   Charges:   PT Evaluation $PT Eval Moderate Complexity: 1 Mod PT Treatments $Therapeutic Activity: 8-22 mins       Ivar Drape 05/19/2023, 5:59 PM  Samul Dada, PT PhD Acute Rehab Dept. Number: [facility] [id number] and [facility] [phone]

## 2023-05-19 NOTE — Progress Notes (Signed)
HD#3 Subjective:   Summary: [name] is a 57 y.o. 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   Overall doing well today but is tired after surgery yesterday.  She feels like her right leg is feeling better and has had no new or worsening symptoms other than postop pain in her right foot.  Objective:  Vital signs in last 24 hours: Vitals:   05/18/23 1726 05/18/23 1951 05/19/23 0007 05/19/23 0430  BP: (!) 119/48 (!) 103/58 (!) 103/58 116/65  Pulse: 77 87 81 86  Resp: 16 16 17 18   Temp: 98.3 F (36.8 C) 98.2 F (36.8 C) 98.7 F (37.1 C) 98.4 F (36.9 C)  TempSrc: Oral Oral Oral Oral  SpO2: 92% 98% 90% 91%  Weight:      Height:       Supplemental O2: Room Air   Physical Exam:  Constitutional: Well-appearing middle-aged female laying in bed, in no acute distress Cardiovascular: regular rate and rhythm, no m/r/g Pulmonary/Chest: normal work of breathing on room air MSK: 1+ pitting edema of the lower extremities bilaterally with very mild warmth of the right lower extremity compared to the left.  Right lower extremity wrapped from the toes up to just below the knee with Ace wrap Neurological: alert & oriented x 3  Filed Weights   05/16/23 0009  Weight: 102.5 kg      Intake/Output Summary (Last 24 hours) at 05/19/2023 0627 Last data filed at 05/19/2023 0400 Gross per 24 hour  Intake 1290 ml  Output 710 ml  Net 580 ml   Net IO Since Admission: 1,456.58 mL [05/19/23 0627]  Pertinent Labs:    Latest Ref Rng & Units 05/19/2023    1:26 AM 05/18/2023   12:32 AM 05/17/2023   12:44 AM  CBC  WBC 4.0 - 10.5 K/uL 13.1  14.2  16.7   Hemoglobin 12.0 - 15.0 g/dL 8.9   9.5  9.4   Hematocrit 36.0 - 46.0 % 28.5  30.1  30.3   Platelets 150 - 400 K/uL 206  184  155        Latest Ref Rng & Units 05/19/2023    1:26 AM 05/18/2023   12:32 AM 05/17/2023   12:44 AM  CMP  Glucose 70 - 99 mg/dL 086  578  469   BUN 6 - 20 mg/dL 10  10  7    Creatinine 0.44 - 1.00 mg/dL 6.29  5.28  4.13   Sodium 135 - 145 mmol/L 134  133  134   Potassium 3.5 - 5.1 mmol/L 3.8  3.2  3.0   Chloride 98 - 111 mmol/L 100  98  101   CO2 22 - 32 mmol/L 23  23  23    Calcium 8.9 - 10.3 mg/dL 8.2  7.9  8.1     Assessment/Plan:   Principal Problem:   Osteomyelitis of fifth toe of right foot (HCC)   Patient Summary: [name] is a 57 y.o. 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   Sepsis Right lower extremity cellulitis Right fifth metatarsal osteomyelitis Diabetic foot wound S/p right fifth ray amputation 05/18/2023.  Plan for 3 more days of antibiotics after this.  Currently on vancomycin and cefepime.  Orthopedics to see the patient.  Continues to be stable without return of fever. -2 more days of antibiotics as above - Monitor blood cultures, no growth at 2 day - Nonweightbearing after right fifth ray amputation and will follow-up with orthopedics outpatient  Hypertension Normotensive here.  Will continue home medications below but hold amlodipine for now.  Will add back if pressures allow. - Hydralazine 25 mg 3 times daily, metoprolol succinate 50 mg daily, Imdur 90 mg daily, Spironolactone 100 mg daily  T2DM A1c 10.4 in 02/2023.  Home medications listed Trulicity 0.75 mg weekly and Lantus 25 units daily.  - Semglee 25 units daily and SSI -  Continue gabapentin 300 mg twice daily  Paroxysmal A-fib on Xarelto Holding Xarelto here and will keep on DVT prophylaxis with Lovenox until completed surgical course. 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  Will continue home medications. - Atorvastatin 40 mg daily, furosemide 40 mg daily, hydralazine 25 mg 3 times daily, Imdur 90 mg daily, 20 metoprolol succinate 50 mg daily, spironolactone 100 mg daily  OSA CPAP at night  Seizures Keppra 500 mg twice daily  Anxiety and Depression Continue home medications of escitalopram 20 mg daily, buspirone 10 mg twice daily, trazodone 100 mg at bedtime  Housing Notified today by the patient that she has to move out of her home rental on 05/20/2023, in 2 days.  She has notified her landlord but there is been no extension.  Will send a consult to [facility] for assistance.  Diet: Carb-Modified VTE: Enoxaparin Code: Full  Dispo: Anticipated discharge home with PT/OT and orthopedic follow-up in the next 1 to 2 days.  Rocky Morel, DO Internal Medicine Resident PGY-1 Pager: [phone]  Please contact the on call pager after 5 pm and on weekends at [phone].

## 2023-05-20 ENCOUNTER — Other Ambulatory Visit (HOSPITAL_COMMUNITY): Payer: Self-pay

## 2023-05-20 DIAGNOSIS — M86171 Other acute osteomyelitis, right ankle and foot: Secondary | ICD-10-CM | POA: Diagnosis not present

## 2023-05-20 DIAGNOSIS — L03115 Cellulitis of right lower limb: Secondary | ICD-10-CM | POA: Diagnosis not present

## 2023-05-20 DIAGNOSIS — E1169 Type 2 diabetes mellitus with other specified complication: Secondary | ICD-10-CM | POA: Diagnosis not present

## 2023-05-20 DIAGNOSIS — E1165 Type 2 diabetes mellitus with hyperglycemia: Secondary | ICD-10-CM | POA: Diagnosis not present

## 2023-05-20 DIAGNOSIS — F419 Anxiety disorder, unspecified: Secondary | ICD-10-CM

## 2023-05-20 LAB — GLUCOSE, CAPILLARY
Glucose-Capillary: 133 mg/dL — ABNORMAL HIGH (ref 70–99)
Glucose-Capillary: 136 mg/dL — ABNORMAL HIGH (ref 70–99)
Glucose-Capillary: 144 mg/dL — ABNORMAL HIGH (ref 70–99)
Glucose-Capillary: 184 mg/dL — ABNORMAL HIGH (ref 70–99)
Glucose-Capillary: 84 mg/dL (ref 70–99)

## 2023-05-20 LAB — CBC WITH DIFFERENTIAL/PLATELET
Abs Immature Granulocytes: 0.14 10*3/uL — ABNORMAL HIGH (ref 0.00–0.07)
Basophils Absolute: 0.1 10*3/uL (ref 0.0–0.1)
Basophils Relative: 0 %
Eosinophils Absolute: 0.2 10*3/uL (ref 0.0–0.5)
Eosinophils Relative: 1 %
HCT: 28.5 % — ABNORMAL LOW (ref 36.0–46.0)
Hemoglobin: 8.9 g/dL — ABNORMAL LOW (ref 12.0–15.0)
Immature Granulocytes: 1 %
Lymphocytes Relative: 15 %
Lymphs Abs: 2.4 10*3/uL (ref 0.7–4.0)
MCH: 26.4 pg (ref 26.0–34.0)
MCHC: 31.2 g/dL (ref 30.0–36.0)
MCV: 84.6 fL (ref 80.0–100.0)
Monocytes Absolute: 1 10*3/uL (ref 0.1–1.0)
Monocytes Relative: 7 %
Neutro Abs: 12 10*3/uL — ABNORMAL HIGH (ref 1.7–7.7)
Neutrophils Relative %: 76 %
Platelets: 227 10*3/uL (ref 150–400)
RBC: 3.37 MIL/uL — ABNORMAL LOW (ref 3.87–5.11)
RDW: 13.7 % (ref 11.5–15.5)
WBC: 15.7 10*3/uL — ABNORMAL HIGH (ref 4.0–10.5)
nRBC: 0 % (ref 0.0–0.2)

## 2023-05-20 LAB — RENAL FUNCTION PANEL
Albumin: 2.3 g/dL — ABNORMAL LOW (ref 3.5–5.0)
Anion gap: 7 (ref 5–15)
BUN: 11 mg/dL (ref 6–20)
CO2: 25 mmol/L (ref 22–32)
Calcium: 8.4 mg/dL — ABNORMAL LOW (ref 8.9–10.3)
Chloride: 100 mmol/L (ref 98–111)
Creatinine, Ser: 0.86 mg/dL (ref 0.44–1.00)
GFR, Estimated: >60 mL/min (ref >60)
Glucose, Bld: 139 mg/dL — ABNORMAL HIGH (ref 70–99)
Phosphorus: 2.6 mg/dL (ref 2.5–4.6)
Potassium: 3.7 mmol/L (ref 3.5–5.1)
Sodium: 132 mmol/L — ABNORMAL LOW (ref 135–145)

## 2023-05-20 LAB — CULTURE, BLOOD (ROUTINE X 2): Special Requests: ADEQUATE

## 2023-05-20 MED ORDER — ACETAMINOPHEN 500 MG PO TABS
1000.0000 mg | ORAL_TABLET | Freq: Three times a day (TID) | ORAL | 0 refills | Status: DC
  Filled 2023-05-20: qty 100, 17d supply, fill #0

## 2023-05-20 MED ORDER — DOXYCYCLINE HYCLATE 100 MG PO TABS
100.0000 mg | ORAL_TABLET | Freq: Two times a day (BID) | ORAL | 0 refills | Status: DC
  Filled 2023-05-20: qty 3, 2d supply, fill #0

## 2023-05-20 MED ORDER — DOXYCYCLINE HYCLATE 100 MG PO TABS
100.0000 mg | ORAL_TABLET | Freq: Two times a day (BID) | ORAL | Status: DC
  Administered 2023-05-20: 100 mg via ORAL
  Filled 2023-05-20: qty 1

## 2023-05-20 MED ORDER — CEFADROXIL 500 MG PO CAPS
500.0000 mg | ORAL_CAPSULE | Freq: Two times a day (BID) | ORAL | 0 refills | Status: DC
  Filled 2023-05-20: qty 3, 2d supply, fill #0

## 2023-05-20 MED ORDER — CEFADROXIL 500 MG PO CAPS
500.0000 mg | ORAL_CAPSULE | Freq: Two times a day (BID) | ORAL | Status: DC
  Administered 2023-05-20: 500 mg via ORAL
  Filled 2023-05-20: qty 1

## 2023-05-20 MED ORDER — ZINC SULFATE 220 (50 ZN) MG PO TABS
220.0000 mg | ORAL_TABLET | Freq: Every day | ORAL | 0 refills | Status: AC
  Filled 2023-05-20: qty 100, 100d supply, fill #0

## 2023-05-20 MED ORDER — METOPROLOL SUCCINATE ER 50 MG PO TB24
50.0000 mg | ORAL_TABLET | Freq: Every day | ORAL | 0 refills | Status: DC
  Filled 2023-05-20: qty 30, 30d supply, fill #0

## 2023-05-20 MED ORDER — VITAMIN C 500 MG PO TABS
1000.0000 mg | ORAL_TABLET | Freq: Every day | ORAL | 0 refills | Status: DC
  Filled 2023-05-20: qty 100, 50d supply, fill #0

## 2023-05-20 MED ORDER — OXYCODONE HCL 5 MG PO TABS
5.0000 mg | ORAL_TABLET | Freq: Four times a day (QID) | ORAL | 0 refills | Status: AC | PRN
  Filled 2023-05-20: qty 20, 5d supply, fill #0

## 2023-05-20 MED ORDER — RIVAROXABAN 20 MG PO TABS
20.0000 mg | ORAL_TABLET | Freq: Every day | ORAL | Status: DC
  Administered 2023-05-20: 20 mg via ORAL
  Filled 2023-05-20: qty 1

## 2023-05-20 NOTE — TOC Initial Note (Addendum)
Transition of Care [facility]) - Initial/Assessment Note    Patient Details  Name: [name] MRN: [id number] Date of Birth: Feb 28, 1966  [facility]) CM/SW Contact:    Kingsley Plan, RN Phone Number: 05/20/2023, 10:05 AM  Clinical Narrative:                 Patient to patient at bedside.   Consult : Pt has to move out of rental 05/20/2023. She needs help either extending or avoiding eviction and help with moving. Explain to patient NCM  will add financial resources to AVS , we do not assist with moving.   PT recommendation is for SNF explained for medicaid , patient required to sign over her cheque to SNF and stay 30 days. She is not willing to do that.   Also  Medicaid  only covers 2 HHPT visits after approval received.     Most Medicaid patients go to OP PT , she can use Medicaid transportation . She feels she is almost at baseline . Her plan is to stay with family at discharge, she does not have an address yet . Once she has address we can arrange OP P or call home health agencies to see if they would be willing to accept.    She has a motorized wheel chair , hemi walker, rolling walker and quad cane .   Talked to [facility] with [facility]. [facility] recently discharged her from services. If she goes to an address at discharge that is in the [facility] coverage area they can take her back for [facility] and HHPT, they would need to submit for approval for HHPT if they receive approval it would only be for a couple visits   Patient receives aide services through [facility] with [facility] . She has an aide 7 days a week from 9 am to 12pm and then 6 pm to 8:30 pm .   Once patient has an address will update Tresa Endo with [facility] and [facility].   Patient aware of all of above and voiced understanding     Patient is discharging to address listed in EPIC . She has people to help her move on Sunday. She plans to go to a hotel. Called Kelly with [facility] while at  bedside. [facility] will not be able to see she before Sunday. Number on AVS for patient to call [facility] and let them know where she will be staying. Patient voiced understanding.   NCM offered to call [facility] patient stated she will call.   PTAR paperwork on chart .  Nurse will call [facility]  Barriers to Discharge: Continued Medical Work up  [employer] when ready  Patient Goals and CMS Choice Patient states their goals for this hospitalization and ongoing recovery are:: to get stronger          Expected Discharge Plan and Services   Discharge Planning Services: CM Consult   Living arrangements for the past 2 months: Single Family Home                 DME Arranged: N/A                    Prior Living Arrangements/Services Living arrangements for the past 2 months: Single Family Home Lives with:: Self Patient language and need for interpreter reviewed:: Yes        Need for Family Participation in Patient Care: Yes (Comment) Care giver support system in place?: Yes (  comment) Current home services: DME Criminal Activity/Legal Involvement Pertinent to Current Situation/Hospitalization: No - Comment as needed  Activities of Daily Living Home Assistive Devices/Equipment: Eyeglasses ADL Screening (condition at time of admission) Patient's cognitive ability adequate to safely complete daily activities?: Yes Is the patient deaf or have difficulty hearing?: No Does the patient have difficulty seeing, even when wearing glasses/contacts?: No Does the patient have difficulty concentrating, remembering, or making decisions?: No Patient able to express need for assistance with ADLs?: Yes Does the patient have difficulty dressing or bathing?: Yes Independently performs ADLs?: No Communication: Independent Dressing (OT): Needs assistance Is this a change from baseline?: Pre-admission baseline Grooming: Needs assistance Is this a change from baseline?: Pre-admission  baseline Feeding: Independent Bathing: Needs assistance Is this a change from baseline?: Pre-admission baseline Toileting: Needs assistance Is this a change from baseline?: Pre-admission baseline In/Out Bed: Needs assistance Is this a change from baseline?: Pre-admission baseline Walks in Home: Needs assistance Is this a change from baseline?: Pre-admission baseline Does the patient have difficulty walking or climbing stairs?: Yes Weakness of Legs: Both Weakness of Arms/Hands: Both  Permission Sought/Granted   Permission granted to share information with : No              Emotional Assessment Appearance:: Appears stated age Attitude/Demeanor/Rapport: Engaged Affect (typically observed): Accepting Orientation: : Oriented to Place, Oriented to Self, Oriented to  Time, Oriented to Situation Alcohol / Substance Use: Not Applicable    Admission diagnosis:  Osteomyelitis of foot (HCC) [M86.9] Diabetic foot infection (HCC) [Z61.096, L08.9] Patient Active Problem List   Diagnosis Date Noted   Osteomyelitis of fifth toe of right foot (HCC) 05/16/2023   Pyogenic inflammation of bone (HCC) 03/11/2023   Foot abscess, right 03/08/2023   Cellulitis of right lower extremity 03/07/2023   Type 2 diabetes mellitus with hyperglycemia, with long-term current use of insulin (HCC) 03/07/2023   Diabetic foot infection (HCC) 03/05/2023   Sepsis (HCC) 03/05/2023   PTSD (post-traumatic stress disorder) 01/20/2023   Anxiety state 01/20/2023   Insomnia due to other mental disorder 01/20/2023   Encounter for screening involving social determinants of health (SDoH) 11/18/2022   Cough 11/02/2022   Tinea cruris 09/13/2022   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   Atrial fibrillation (HCC) 09/13/2019   Vitamin B12 deficiency 09/15/2017   Lumbar radiculopathy 08/03/2017   Constipation 09/09/2015   Localization-related symptomatic epilepsy and epileptic syndromes with complex partial seizures, not  intractable, without status epilepticus (HCC) 06/13/2015   Hemiparesis affecting left side as late effect of cerebrovascular accident (HCC) 06/13/2015   Morbid obesity (HCC) 06/13/2015   Candidal intertrigo 11/05/2014   Vitamin D deficiency 10/01/2014   Tachycardia 07/04/2014   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/23/2013   Mixed stress and urge urinary incontinence 10/11/2013   Health care maintenance 09/13/2013   Chronic combined systolic and diastolic heart failure (HCC) 08/01/2013   Nonischemic cardiomyopathy (HCC) 08/01/2013   LBBB (left bundle branch block) 07/19/2013   Depression 04/09/2013   Primary osteoarthritis of left knee 04/14/2012   Lumbar spondylosis 04/14/2012   Poorly controlled type 2 diabetes mellitus with neuropathy (HCC) 04/14/2012   Hyperlipidemia 11/23/2011   OSA (obstructive sleep apnea) 08/27/2011   Benign essential HTN 08/27/2010   PCP:  Chauncey Mann, DO Pharmacy:   [facility] [address], [place] - [address] [phone] [address] [place] [address] Phone: [phone] Fax: [phone]     Social Determinants of Health (SDOH) Social History: SDOH Screenings  Food Insecurity: Food Insecurity Present (03/23/2023)  Housing: High Risk (03/23/2023)  Transportation Needs: Unmet Transportation Needs (03/23/2023)  Utilities: At Risk (03/23/2023)  Alcohol Screen: Low Risk  (03/23/2023)  Depression (PHQ2-9): High Risk (04/26/2023)  Financial Resource Strain: Medium Risk (03/23/2023)  Physical Activity: Insufficiently Active (03/23/2023)  Social Connections: Moderately Isolated (03/23/2023)  Tobacco Use: Low Risk  (05/19/2023)   SDOH Interventions:     Readmission Risk Interventions    03/11/2023    3:14 PM  Readmission Risk Prevention Plan  Transportation Screening Complete  PCP or Specialist Appt within 5-7 Days Complete  Home Care Screening Complete  Medication Review (RN CM) Referral to Pharmacy

## 2023-05-20 NOTE — Plan of Care (Signed)
Adequate for discharge.

## 2023-05-20 NOTE — Discharge Instructions (Signed)
[name],  You were recently admitted to [facility] for infection in the bone in your right foot.  You did really well after starting on antibiotics and then had surgery.  You will follow-up with Dr. Lajoyce Corners for wound care in about 1 week.  Please also call our clinic at [phone] to get a hospital follow-up appointment next week.  Continue taking your home medications with the following changes  Start taking Cefadroxil 500 mg twice a day starting tonight.  You will take 1 pill tonight, and 2 pills tomorrow total. Doxycycline 100 mg a twice a day starting tonight.  You will take 1 pill tonight, and 2 pills tomorrow total. Oxycodone 5 mg every 6 hours as needed for pain Metoprolol succinate 50 mg daily Stop taking Metoprolol succinate 100 mg daily Ibuprofen Continue taking your other medications as prescribed   We recommend that you see your primary care doctor in about a week to make sure that you continue to improve. We are so glad that you are feeling better.  Sincerely, Rocky Morel, DO

## 2023-05-20 NOTE — Discharge Summary (Addendum)
Name: [name] MRN: [id number] DOB: May 11, 1966 57 y.o. PCP: Chauncey Mann, DO  Date of Admission: 05/15/2023 11:47 PM Date of Discharge: 05/20/2023 Attending Physician: Dr.  Sol Blazing  Discharge Diagnosis: Principal Problem:   Osteomyelitis of fifth toe of right foot (HCC) Active Problems:   Diabetic foot infection (HCC)   Cellulitis of right lower extremity   Type 2 diabetes mellitus with hyperglycemia, with long-term current use of insulin [facility])    Discharge Medications: Allergies as of 05/20/2023       Reactions   Ace Inhibitors Anaphylaxis, Other (See Comments)   Swelling of the tongue and throat    Cleocin [clindamycin] Anaphylaxis, Swelling   Enalapril Maleate Anaphylaxis, Other (See Comments)   Swelling of the tongue and throat    Zestril [lisinopril] Anaphylaxis, Other (See Comments)   Swelling of the tongue and throat         Medication List     STOP taking these medications    amLODipine 10 MG tablet Commonly known as: NORVASC   ibuprofen 200 MG tablet Commonly known as: ADVIL       TAKE these medications    Accu-Chek FastClix Lancets Misc USE TO CHECK BLOOD SUGAR TWICE DAILY   Accu-Chek Guide test strip Generic drug: glucose blood USE TO CHECK BLOOD SUGAR TWICE DAILY   Accu-Chek Guide w/Device Kit Use As Directed   acetaminophen 500 MG tablet Commonly known as: TYLENOL Take 2 tablets (1,000 mg total) by mouth every 8 (eight) hours.   ascorbic acid 1000 MG tablet Commonly known as: VITAMIN C Take 1 tablet (1,000 mg total) by mouth daily. Start taking on: May 21, 2023   atorvastatin 40 MG tablet Commonly known as: LIPITOR Take 1 tablet (40 mg total) by mouth daily.   busPIRone 10 MG tablet Commonly known as: BUSPAR Take 1 tablet (10 mg total) by mouth 2 (two) times daily.   cefadroxil 500 MG capsule Commonly known as: DURICEF Take 1 capsule (500 mg total) by mouth 2 (two) times daily.   Comfort EZ Pen Needles 31G X 8 MM  Misc Generic drug: Insulin Pen Needle USE TO INJECT INSULIN IN THE MORNING, NOON, EVENING, AND BEDTIME   cyanocobalamin 1000 MCG tablet Take 1 tablet by mouth daily.   cyclobenzaprine 5 MG tablet Commonly known as: FLEXERIL TAKE ONE TABLET BY MOUTH THREE TIMES DAILY AS NEEDED FOR MUSCLE SPASMS What changed: See the new instructions.   diclofenac Sodium 1 % Gel Commonly known as: VOLTAREN APPLY 4 GRAMS TOPICALLY TO LEFT KNEE FOUR TIMES DAILY. What changed: See the new instructions.   doxycycline 100 MG tablet Commonly known as: VIBRA-TABS Take 1 tablet (100 mg total) by mouth every 12 (twelve) hours.   escitalopram 20 MG tablet Commonly known as: LEXAPRO Take 1 tablet (20 mg total) by mouth daily.   fluticasone 0.05 % cream Commonly known as: CUTIVATE Apply 1 Application topically 2 (two) times daily.   folic acid 1 MG tablet Commonly known as: FOLVITE Take 1 mg by mouth daily.   FreeStyle Libre 3 Reader Devi 1 Device by Does not apply route continuous. Monitor sugars continuously   FreeStyle Libre 3 Sensor Misc Place new sensor every 14 days. Monitor sugars continuously.   furosemide 40 MG tablet Commonly known as: LASIX Take 40 mg by mouth daily.   gabapentin 300 MG capsule Commonly known as: NEURONTIN Take 1 capsule (300 mg total) by mouth 2 (two) times daily.   hydrALAZINE 25 MG tablet Commonly  known as: APRESOLINE Take 1 tablet (25 mg total) by mouth 3 (three) times daily.   insulin aspart cartridge Commonly known as: NOVOLOG Inject into the skin. Use for Freestyle libre pump   isosorbide mononitrate 60 MG 24 hr tablet Commonly known as: IMDUR Take 1.5 tablets (90 mg total) by mouth daily.   Klor-Con M20 20 MEQ tablet Generic drug: potassium chloride SA TAKE ONE TABLET BY MOUTH DAILY What changed: how much to take   Lantus SoloStar 100 UNIT/ML Solostar Pen Generic drug: insulin glargine INJECT 25 UNITS INTO THE SKIN EVERY DAY What changed: See  the new instructions.   levETIRAcetam 500 MG tablet Commonly known as: KEPPRA TAKE ONE TABLET BY MOUTH TWICE DAILY   linaclotide 290 MCG Caps capsule Commonly known as: Linzess Take 1 capsule (290 mcg total) by mouth daily as needed (Constipation).   liver oil-zinc oxide 40 % ointment Commonly known as: DESITIN Apply topically every 4 (four) hours. What changed:  how much to take when to take this reasons to take this   metoprolol succinate 50 MG 24 hr tablet Commonly known as: TOPROL-XL Take 1 tablet (50 mg total) by mouth daily. Start taking on: May 21, 2023 What changed:  medication strength how much to take   oxyCODONE 5 MG immediate release tablet Commonly known as: Roxicodone Take 1 tablet (5 mg total) by mouth every 6 (six) hours as needed for up to 5 days for severe pain.   pantoprazole 40 MG tablet Commonly known as: PROTONIX TAKE ONE TABLET BY MOUTH EVERY DAY   rivaroxaban 20 MG Tabs tablet Commonly known as: Xarelto Take 1 tablet (20 mg total) by mouth daily with supper.   spironolactone 100 MG tablet Commonly known as: ALDACTONE TAKE ONE TABLET BY MOUTH EVERY DAY   Stool Softener/Laxative 8.6-50 MG tablet Generic drug: senna-docusate TAKE ONE TABLET BY MOUTH AT BEDTIME AS NEEDED FOR MILD CONSTIPATION What changed: See the new instructions.   traZODone 100 MG tablet Commonly known as: DESYREL Take 1 tablet (100 mg total) by mouth at bedtime.   Trulicity 0.75 MG/0.5ML Sopn Generic drug: Dulaglutide INJECT 0.75 MG SUBCUTANEOUSLY ONCE WEEKLY What changed: See the new instructions.   Ventolin HFA 108 (90 Base) MCG/ACT inhaler Generic drug: albuterol INHALE TWO PUFFS INTO THE LUNGS EVERY 6 HOURS AS NEEDED FOR WHEEZING OR SHORTNESS OF BREATH What changed: See the new instructions.   Vitamin D (Ergocalciferol) 1.25 MG (50000 UNIT) Caps capsule Commonly known as: DRISDOL TAKE ONE CAPSULE BY MOUTH EVERY 7 DAYS   Zinc Sulfate 220 (50 Zn) MG Tabs Take  1 tablet (220 mg total) by mouth daily. Start taking on: May 21, 2023               Discharge Care Instructions  (From admission, onward)           Start     Ordered   05/20/23 0000  Discharge wound care:       Comments: Follow up with Dr. Lajoyce Corners in 1 week for wound care.   05/20/23 1454            Disposition and follow-up:   [name] was discharged from [facility] in Good condition.  At the hospital follow up visit please address:  1.  Follow-up:  a.  Ensure follow-up with orthopedics for wound care and continued post-op management.    b.  Reevaluate blood pressure as we have held her amlodipine on discharge.   c.  Ensure good PT and OT outpatient follow-up.  2.  Labs / imaging needed at time of follow-up: BMP, CBC  3.  Pending labs/ test needing follow-up: Blood cultures  Follow-up Appointments:  Follow-up Information     [name], MD Follow up in 1 week(s).   Specialty: Orthopedic Surgery Contact information: [address] [place] [address] [phone]         [facility] Follow up.   Specialty: Home Health Services Contact information: [address] [place] [address] [phone]         [facility] .          [facility] Follow up.   Contact information: [address] [place] [address] [phone]                Hospital Course by problem list: [name] is a 57 y.o. 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    Sepsis Right lower extremity cellulitis Right fifth metatarsal  osteomyelitis Diabetic foot wound Presented with cellulitis of her right lower extremity extending up to her knee and due to failure of oral antibiotics for right fifth toe/metatarsal osteomyelitis.  She was placed on broad-spectrum antibiotics and then narrowed after surgery.  Blood cultures remain negative during admission at 3 days.  MRI showed osteomyelitis confined to the distal portion of the right first metatarsal.  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  She was discharged with doxycycline and cefadroxil to finish a 3-day course of antibiotics after surgery.  She will follow-up with Dr. Lajoyce Corners in his office for further wound care and will continue with outpatient PT and OT after declining to go to a skilled nursing facility.   Hypertension Remained predominantly normotensive here after holding her amlodipine but continuing her other home antihypertensives.  She will stop taking amlodipine and may resume this if her blood pressure is high in the ambulatory setting.   T2DM A1c 10.4 in 02/2023.  Home medications listed Trulicity 0.75 mg weekly and Lantus 25 units daily.  She did well on Semglee 25 units daily and sliding scale insulin.  We also continued her gabapentin 300 mg twice daily.  Paroxysmal A-fib on Xarelto Held Xarelto until about 36 hours after surgery.  She was kept on DVT prophylaxis Lovenox during that time.    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  Continued atorvastatin 40 mg daily, furosemide 40 mg daily, hydralazine 25 mg 3 times daily, Imdur 90 mg daily, 20 metoprolol succinate 50 mg daily, spironolactone 100 mg daily.   OSA CPAP at night   Seizures Keppra 500 mg twice daily   Anxiety and Depression Continue home medications of escitalopram 20 mg daily, buspirone 10 mg twice daily, trazodone 100 mg at bedtime   Housing Unfortunately patient was planning to  move out of her rental housing situation on 05/20/2023, which is the day of discharge.  Unfortunately her landlord did not extend her time while she was in the hospital.  She was recommended to do acute rehab at a skilled nursing facility but declined  to do this due to needing to figure this out and move out.  She will go live with a family member and then move into a hotel.     Discharge Subjective: She is doing overall well this morning although tired.  She does have numbness going down the lateral side of her right leg starting in her knee.  Discussed this with Dr. Lajoyce Corners who believes this is most likely from the popliteal block and should resolve in the next few days.  Discharge Exam:   BP (!) 117/53 (BP Location: Right Arm)   Pulse 74   Temp 98.4 F (36.9 C) (Oral)   Resp 16   Ht 5\' 9"  (1.753 m)   Wt 102.5 kg   LMP 09/13/2019 (Exact Date)   SpO2 97%   BMI 33.37 kg/m  Constitutional: Well-appearing middle-aged female laying in bed, in no acute distress Cardiovascular: regular rate and rhythm, no m/r/g Pulmonary/Chest: normal work of breathing on room air MSK: 1+ pitting edema of the lower extremities bilaterally with very mild warmth of the right lower extremity compared to the left.  Right lower extremity wrapped from the toes up to just below the knee with Ace wrap Neurological: alert & oriented x 3  Pertinent Labs, Studies, and Procedures:     Latest Ref Rng & Units 05/20/2023    8:56 AM 05/19/2023    1:26 AM 05/18/2023   12:32 AM  CBC  WBC 4.0 - 10.5 K/uL 15.7  13.1  14.2   Hemoglobin 12.0 - 15.0 g/dL 8.9  8.9  9.5   Hematocrit 36.0 - 46.0 % 28.5  28.5  30.1   Platelets 150 - 400 K/uL 227  206  184        Latest Ref Rng & Units 05/20/2023    8:56 AM 05/19/2023    1:26 AM 05/18/2023   12:32 AM  CMP  Glucose 70 - 99 mg/dL 161  096  045   BUN 6 - 20 mg/dL 11  10  10    Creatinine 0.44 - 1.00 mg/dL 4.09  8.11  9.14   Sodium 135 - 145 mmol/L 132  134  133   Potassium 3.5 - 5.1 mmol/L  3.7  3.8  3.2   Chloride 98 - 111 mmol/L 100  100  98   CO2 22 - 32 mmol/L 25  23  23    Calcium 8.9 - 10.3 mg/dL 8.4  8.2  7.9     MR FOOT RIGHT WO CONTRAST  Result Date: 05/16/2023 CLINICAL DATA:  Soft tissue infection suspected, rule out osteomyelitis. Osteopenia about the fifth metatarsal head. EXAM: MRI OF THE RIGHT FOREFOOT WITHOUT CONTRAST TECHNIQUE: Multiplanar, multisequence MR imaging of the right forefoot was performed. No intravenous contrast was administered. COMPARISON:  Radiographs dated May 16, 2019 FINDINGS: Bones/Joint/Cartilage Bone marrow edema of the head of the fifth metatarsal and phalanges of the fifth digit concerning for osteomyelitis. Marrow signal within remaining osseous structures is within normal limits, evaluation is however somewhat limited due to multiple sequences are degraded due to motion. Ligaments Lisfranc ligament is intact. Collateral ligaments appear maintained. Muscles and Tendons Increased intramuscular signal of the plantar muscles suggesting diabetic myopathy/myositis. Soft tissues Deep skin wound with surrounding edema and inflammatory changes about the head of the fifth metatarsal. Marked subcutaneous soft tissue edema about the dorsum/lateral aspect of the foot. IMPRESSION: 1. Bone marrow edema of the head of the fifth metatarsal and phalanges of the fifth digit concerning for osteomyelitis. 2. Deep  skin wound with surrounding edema and inflammatory changes about the head of the fifth metatarsal. 3. Increased intramuscular signal of the plantar muscles suggesting diabetic myopathy/myositis. 4. Soft tissue edema about the dorsum of the foot suggesting cellulitis. Electronically Signed   By: Larose Hires D.O.   On: 05/16/2023 18:04   VAS [place] LOWER EXTREMITY VENOUS (DVT)  Result Date: 05/16/2023  Lower Venous DVT Study Patient Name:  [name]  Date of Exam:   05/16/2023 Medical Rec #: [id number]         Accession #:    [id number] Date of Birth: 19-Apr-1966          Patient Gender: F Patient Age:   77 years Exam Location:  [facility] Procedure:      VAS [place] LOWER EXTREMITY VENOUS (DVT) Referring Phys: EMILY MULLEN --------------------------------------------------------------------------------  Indications: Pain, and Swelling.  Limitations: Body habitus and poor ultrasound/tissue interface. Comparison Study: No previous study. Performing Technologist: McKayla Maag RVT, [place]  Examination Guidelines: A complete evaluation includes B-mode imaging, spectral Doppler, color Doppler, and power Doppler as needed of all accessible portions of each vessel. Bilateral testing is considered an integral part of a complete examination. Limited examinations for reoccurring indications may be performed as noted. The reflux portion of the exam is performed with the patient in reverse Trendelenburg.  +---------+---------------+---------+-----------+----------+-------------------+ RIGHT    CompressibilityPhasicitySpontaneityPropertiesThrombus Aging      +---------+---------------+---------+-----------+----------+-------------------+ CFV      Full           Yes      Yes                                      +---------+---------------+---------+-----------+----------+-------------------+ SFJ      Full                                                             +---------+---------------+---------+-----------+----------+-------------------+ FV Prox  Full                                                             +---------+---------------+---------+-----------+----------+-------------------+ FV Mid   Full                                                             +---------+---------------+---------+-----------+----------+-------------------+ FV DistalFull                                                             +---------+---------------+---------+-----------+----------+-------------------+ PFV      Full                                                              +---------+---------------+---------+-----------+----------+-------------------+  POP      Full           Yes      Yes                                      +---------+---------------+---------+-----------+----------+-------------------+ PTV                                                   Not well visualized +---------+---------------+---------+-----------+----------+-------------------+ PERO                                                  Not well visualized +---------+---------------+---------+-----------+----------+-------------------+   +--------+---------------+---------+-----------+----------+--------------------+ LEFT    CompressibilityPhasicitySpontaneityPropertiesThrombus Aging       +--------+---------------+---------+-----------+----------+--------------------+ CFV                    Yes      Yes                  Patent by color                                                           doppler              +--------+---------------+---------+-----------+----------+--------------------+ SFJ                    Yes      Yes                  Patnet by color                                                           doppler              +--------+---------------+---------+-----------+----------+--------------------+ FV Prox                                              Not well visualized  +--------+---------------+---------+-----------+----------+--------------------+ FV Mid  Full                                                              +--------+---------------+---------+-----------+----------+--------------------+ FV      Full  Distal                                                                    +--------+---------------+---------+-----------+----------+--------------------+ PFV                                                   Not well visualized  +--------+---------------+---------+-----------+----------+--------------------+ POP     Full           Yes      Yes                                       +--------+---------------+---------+-----------+----------+--------------------+ PTV                                                  Not well visualized  +--------+---------------+---------+-----------+----------+--------------------+ PERO                                                 Not well visualized  +--------+---------------+---------+-----------+----------+--------------------+     Summary: RIGHT: - There is no evidence of deep vein thrombosis in the lower extremity. However, portions of this examination were limited- see technologist comments above.  - No cystic structure found in the popliteal fossa. - Ultrasound characteristics of enlarged lymph nodes are noted in the groin.  LEFT: - There is no evidence of deep vein thrombosis in the lower extremity. However, portions of this examination were limited- see technologist comments above.  - No cystic structure found in the popliteal fossa.  *See table(s) above for measurements and observations. Electronically signed by Lemar Livings MD on 05/16/2023 at 4:41:28 PM.    Final    DG Foot Complete Right  Result Date: 05/16/2023 CLINICAL DATA:  Questionable sepsis - evaluate for abnormality EXAM: RIGHT FOOT COMPLETE - 3+ VIEW COMPARISON:  Foot radiograph 04/12/2023 FINDINGS: Persistent decreased density involving the fifth metatarsal head with erosive change of the lateral cortex, suspicious for osteomyelitis. Overlying skin thickening and possible ulcer. There may be punctate radiopaque debris within the region of ulcer. No new sites suspicious for osteomyelitis. No fracture. Dorsal soft tissue edema has increased. Chronic plantar calcaneal spur and Achilles tendon enthesophyte. IMPRESSION: 1. Persistent decreased density involving the fifth metatarsal head with  erosive change of the lateral cortex, suspicious for osteomyelitis. Suspect overlying soft tissue defect/ulcer. There may be punctate radiopaque debris in the soft tissue defect. 2. Increased dorsal soft tissue edema. Electronically Signed   By: Narda Rutherford M.D.   On: 05/16/2023 00:54   DG Chest Port 1 View  Result Date: 05/16/2023 CLINICAL DATA:  Questionable sepsis - evaluate for abnormality EXAM: PORTABLE CHEST 1 VIEW COMPARISON:  09/12/2022 FINDINGS: Left-sided pacemaker remains in place. Mild cardiomegaly. Stable mediastinal contours. No focal airspace disease. Normal pulmonary vasculature.  No pleural fluid or pneumothorax. No acute osseous findings. IMPRESSION: Mild cardiomegaly. No acute pulmonary process. Electronically Signed   By: Narda Rutherford M.D.   On: 05/16/2023 00:52     Discharge Instructions: Discharge Instructions     Call MD for:  difficulty breathing, headache or visual disturbances   Complete by: As directed    Call MD for:  extreme fatigue   Complete by: As directed    Call MD for:  persistant dizziness or light-headedness   Complete by: As directed    Call MD for:  persistant nausea and vomiting   Complete by: As directed    Call MD for:  redness, tenderness, or signs of infection (pain, swelling, redness, odor or green/yellow discharge around incision site)   Complete by: As directed    Call MD for:  severe uncontrolled pain   Complete by: As directed    Call MD for:  temperature >100.4   Complete by: As directed    Diet - low sodium heart healthy   Complete by: As directed    Discharge wound care:   Complete by: As directed    Follow up with Dr. Lajoyce Corners in 1 week for wound care.   Increase activity slowly   Complete by: As directed    Negative Pressure Wound Therapy - Incisional   Complete by: As directed        Signed: Rocky Morel, DO 05/20/2023, 3:07 PM   Pager: [phone]

## 2023-05-20 NOTE — Progress Notes (Signed)
Physical Therapy Treatment Patient Details Name: [name] MRN: [id number] DOB: 11-14-1966 Today's Date: 05/20/2023   History of Present Illness 57 yo female with onset of being unable to walk on R foot was admitted 6/2, found osteomyelitis on R foot 5th metatarsal with cellulitis.  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  PMHx:  HLD, CVA affecting L side, ICD, HTN CHF, a fib, Dm2, chest pain, a-fib, OSA, seizures.    PT Comments    Pt was seen for transfer practice, initially not interested but was on side of bed when PT returned.  Worked to stand with [facility] after determining platform not available and pt is not comfortable with RW and support.  Pt has no real use of grip on LUE, and is accustomed to using HW or quad cane.  Will recommend her still to <3 hours a day rehab but her plan is to go home with family due to the financial implications of the trip to rehab.  Follow along as her stay permits, but expecting dc to home today.    Recommendations for follow up therapy are one component of a multi-disciplinary discharge planning process, led by the attending physician.  Recommendations may be updated based on patient status, additional functional criteria and insurance authorization.  Follow Up Recommendations  Can patient physically be transported by private vehicle: No    Assistance Recommended at Discharge Frequent or constant Supervision/Assistance  Patient can return home with the following Two people to help with walking and/or transfers;Two people to help with bathing/dressing/bathroom;Assistance with cooking/housework;Assist for transportation;Help with stairs or ramp for entrance   Equipment Recommendations  None recommended by PT    Recommendations for Other Services       Precautions / Restrictions Precautions Precautions: Fall Precaution Comments: R foot 5th metatarsal amp Restrictions Weight Bearing Restrictions: Yes RLE Weight Bearing: Touchdown weight  bearing Other Position/Activity Restrictions: Heel WB permitted transfers only     Mobility  Bed Mobility Overal bed mobility: Needs Assistance Bed Mobility: Supine to Sit, Sit to Supine     Supine to sit: Min assist Sit to supine: Min assist        Transfers Overall transfer level: Needs assistance Equipment used: Hemi-walker, 1 person hand held assist, 2 person hand held assist Transfers: Sit to/from Stand Sit to Stand: Min assist, Mod assist           General transfer comment: initially mod assist then reduced to min assist with her gait belt and HW    Ambulation/Gait               General Gait Details: deferred   Stairs             Wheelchair Mobility    Modified Rankin (Stroke Patients Only)       Balance Overall balance assessment: Needs assistance Sitting-balance support: Feet supported Sitting balance-Leahy Scale: Fair     Standing balance support: Single extremity supported, During functional activity Standing balance-Leahy Scale: Poor Standing balance comment: pt is much more stable with Heel wb and can balance with PT assist and walker, surprisingly good                            Cognition Arousal/Alertness: Awake/alert Behavior During Therapy: WFL for tasks assessed/performed Overall Cognitive Status: Within Functional Limits for tasks assessed  Exercises      General Comments General comments (skin integrity, edema, etc.): Pt was assisted to finish pulling up clothing to dress with CNA and then stood to practice standing on HW, back to bed but LLE is feeling numb      Pertinent Vitals/Pain Pain Assessment Pain Assessment: Faces Faces Pain Scale: Hurts a little bit Pain Location: R foot Pain Descriptors / Indicators: Operative site guarding Pain Intervention(s): Limited activity within patient's tolerance, Monitored during session, Premedicated before  session, Repositioned    Home Living                          Prior Function            PT Goals (current goals can now be found in the care plan section) Acute Rehab PT Goals Patient Stated Goal: to get back to home with power chair Progress towards PT goals: Progressing toward goals    Frequency    Min 4X/week      PT Plan Current plan remains appropriate    Co-evaluation              AM-PAC PT "6 Clicks" Mobility   Outcome Measure  Help needed turning from your back to your side while in a flat bed without using bedrails?: A Lot Help needed moving from lying on your back to sitting on the side of a flat bed without using bedrails?: A Lot Help needed moving to and from a bed to a chair (including a wheelchair)?: A Lot Help needed standing up from a chair using your arms (e.g., wheelchair or bedside chair)?: A Lot Help needed to walk in hospital room?: Total Help needed climbing 3-5 steps with a railing? : Total 6 Click Score: 10    End of Session Equipment Utilized During Treatment: Gait belt Activity Tolerance: Patient limited by fatigue;Treatment limited secondary to medical complications (Comment) Patient left: in bed;with call bell/phone within reach;with nursing/sitter in room Nurse Communication: Mobility status PT Visit Diagnosis: Muscle weakness (generalized) (M62.81);Difficulty in walking, not elsewhere classified (R26.2);Other abnormalities of gait and mobility (R26.89)     Time: 4098-1191 PT Time Calculation (min) (ACUTE ONLY): 32 min  Charges:  $Therapeutic Activity: 23-37 mins            Ivar Drape 05/20/2023, 5:30 PM  Samul Dada, PT PhD Acute Rehab Dept. Number: [facility] [id number] [facility] [phone]

## 2023-05-21 LAB — CULTURE, BLOOD (ROUTINE X 2)
Culture: NO GROWTH
Culture: NO GROWTH

## 2023-05-23 ENCOUNTER — Encounter (HOSPITAL_BASED_OUTPATIENT_CLINIC_OR_DEPARTMENT_OTHER): Payer: Medicaid Other | Admitting: General Surgery

## 2023-05-23 ENCOUNTER — Telehealth: Payer: Self-pay

## 2023-05-23 ENCOUNTER — Encounter (HOSPITAL_COMMUNITY): Payer: Self-pay

## 2023-05-23 ENCOUNTER — Other Ambulatory Visit: Payer: Self-pay

## 2023-05-23 ENCOUNTER — Emergency Department (HOSPITAL_COMMUNITY)
Admission: EM | Admit: 2023-05-23 | Discharge: 2023-05-24 | Disposition: A | Discharge status: Home / Self Care | Payer: Medicaid Other | Attending: Emergency Medicine | Admitting: Emergency Medicine

## 2023-05-23 DIAGNOSIS — Z79899 Other long term (current) drug therapy: Secondary | ICD-10-CM | POA: Diagnosis not present

## 2023-05-23 DIAGNOSIS — Z7901 Long term (current) use of anticoagulants: Secondary | ICD-10-CM | POA: Diagnosis not present

## 2023-05-23 DIAGNOSIS — Z4800 Encounter for change or removal of nonsurgical wound dressing: Secondary | ICD-10-CM | POA: Diagnosis present

## 2023-05-23 DIAGNOSIS — Z5189 Encounter for other specified aftercare: Secondary | ICD-10-CM

## 2023-05-23 DIAGNOSIS — I509 Heart failure, unspecified: Secondary | ICD-10-CM | POA: Insufficient documentation

## 2023-05-23 DIAGNOSIS — E119 Type 2 diabetes mellitus without complications: Secondary | ICD-10-CM | POA: Diagnosis not present

## 2023-05-23 DIAGNOSIS — I4891 Unspecified atrial fibrillation: Secondary | ICD-10-CM | POA: Insufficient documentation

## 2023-05-23 DIAGNOSIS — Z794 Long term (current) use of insulin: Secondary | ICD-10-CM | POA: Diagnosis not present

## 2023-05-23 NOTE — Transitions of Care (Post Inpatient/ED Visit) (Unsigned)
   05/23/2023  Name: JAKHIYA BROWER MRN: [id number] DOB: [birth year]  Today's TOC FU Call Status: Today's TOC FU Call Status:: Unsuccessul Call (1st Attempt) Unsuccessful Call (1st Attempt) Date: 05/23/23  Attempted to reach the patient regarding the most recent Inpatient/ED visit.  Follow Up Plan: Additional outreach attempts will be made to reach the patient to complete the Transitions of Care (Post Inpatient/ED visit) call.   Signature Karena Addison, LPN [facility] Nurse Health Advisor Direct Dial [phone]

## 2023-05-23 NOTE — ED Triage Notes (Signed)
Patient BIB mobile [name], Patient wheelchair bound.   Patient has complaint right foot wound vac leaking. Patient reports having surgery on Wed 05/18/23.  Patient reports discharge looks like blood. No bleeding noted in triage.

## 2023-05-24 ENCOUNTER — Other Ambulatory Visit: Payer: Self-pay | Admitting: Student

## 2023-05-24 MED ORDER — OXYCODONE-ACETAMINOPHEN 5-325 MG PO TABS
1.0000 | ORAL_TABLET | Freq: Once | ORAL | Status: AC
  Administered 2023-05-24: 1 via ORAL
  Filled 2023-05-24: qty 1

## 2023-05-24 NOTE — Discharge Instructions (Signed)
Keep your scheduled appointment with Dr. Lajoyce Corners.  Return to the emergency department for any new or worsening symptoms of concern.

## 2023-05-24 NOTE — Transitions of Care (Post Inpatient/ED Visit) (Unsigned)
   05/24/2023  Name: [name] MRN: [id number] DOB: 1966/04/08  Today's TOC FU Call Status: Today's TOC FU Call Status:: Unsuccessful Call (2nd Attempt) Unsuccessful Call (1st Attempt) Date: 05/23/23 Unsuccessful Call (2nd Attempt) Date: 05/24/23  Attempted to reach the patient regarding the most recent Inpatient/ED visit.  Follow Up Plan: Additional outreach attempts will be made to reach the patient to complete the Transitions of Care (Post Inpatient/ED visit) call.   Signature Karena Addison, LPN [facility] Nurse Health Advisor Direct Dial [phone]

## 2023-05-24 NOTE — ED Provider Notes (Signed)
[facility] EMERGENCY DEPARTMENT AT [facility] Provider Note   CSN: [id number] Arrival date & time: 05/23/23  2043     History  Chief Complaint  Patient presents with   Wound Check    [name] is a 57 y.o. female.   Wound Check  Patient presents for wound check.  Medical history includes OSA, HLD, CVA, T2DM, atrial fibrillation, CHF, osteomyelitis.  She underwent right fifth toe amputation 1 week ago with Dr. Lajoyce Corners.  She was instructed to go back on her Xarelto at time of discharge.  Earlier tonight, patient noticed wound VAC leakage and bleeding from her amputation site.  EMS fix the wound VAC leakage.  Patient denies any worsened pain to the area.  She denies any fevers or chills.  She currently lives at home with son and daughter.  She has a motorized wheelchair to help with mobility.     Home Medications Prior to Admission medications   Medication Sig Start Date End Date Taking? Authorizing Provider  Accu-Chek FastClix Lancets MISC USE TO CHECK BLOOD SUGAR TWICE DAILY 03/29/23   Atway, Rayann N, DO  ACCU-CHEK GUIDE test strip USE TO CHECK BLOOD SUGAR TWICE DAILY 03/29/23   Atway, Rayann N, DO  acetaminophen (TYLENOL) 500 MG tablet Take 2 tablets (1,000 mg total) by mouth every 8 (eight) hours. 05/20/23   Rocky Morel, DO  ascorbic acid (VITAMIN C) 500 MG tablet Take 2 tablets (1,000 mg total) by mouth daily. 05/21/23   Rocky Morel, DO  atorvastatin (LIPITOR) 40 MG tablet Take 1 tablet (40 mg total) by mouth daily. 11/18/22   Atway, Derwood Kaplan, DO  Blood Glucose Monitoring Suppl (ACCU-CHEK GUIDE) w/Device KIT Use As Directed 09/15/22   Marolyn Haller, MD  busPIRone (BUSPAR) 10 MG tablet Take 1 tablet (10 mg total) by mouth 2 (two) times daily. 04/26/23 04/25/24  Nwoko, Tommas Olp, PA  cefadroxil (DURICEF) 500 MG capsule Take 1 capsule (500 mg total) by mouth 2 (two) times daily. 05/20/23   Rocky Morel, DO  COMFORT EZ PEN NEEDLES 31G X 8 MM MISC USE TO INJECT  INSULIN IN THE MORNING, NOON, EVENING, AND BEDTIME 03/29/23   Atway, Rayann N, DO  Continuous Blood Gluc Receiver (FREESTYLE LIBRE 3 READER) DEVI 1 Device by Does not apply route continuous. Monitor sugars continuously 03/23/23   Merrilyn Puma, MD  Continuous Blood Gluc Sensor (FREESTYLE LIBRE 3 SENSOR) MISC Place new sensor every 14 days. Monitor sugars continuously. 03/23/23   Merrilyn Puma, MD  cyanocobalamin 1000 MCG tablet Take 1 tablet by mouth daily. 02/06/19   [provider]  cyclobenzaprine (FLEXERIL) 5 MG tablet TAKE ONE TABLET BY MOUTH THREE TIMES DAILY AS NEEDED FOR MUSCLE SPASMS Patient taking differently: Take 5 mg by mouth 3 (three) times daily as needed for muscle spasms. 09/02/22   Merrilyn Puma, MD  diclofenac Sodium (VOLTAREN) 1 % GEL APPLY 4 GRAMS TOPICALLY TO LEFT KNEE FOUR TIMES DAILY. Patient taking differently: Apply 4 g topically 4 (four) times daily as needed (Pain). Left knee 01/12/22   Merrilyn Puma, MD  doxycycline (VIBRA-TABS) 100 MG tablet Take 1 tablet (100 mg total) by mouth every 12 (twelve) hours. 05/20/23   Rocky Morel, DO  escitalopram (LEXAPRO) 20 MG tablet Take 1 tablet (20 mg total) by mouth daily. 04/26/23   Nwoko, Tommas Olp, PA  fluticasone (CUTIVATE) 0.05 % cream Apply 1 Application topically 2 (two) times daily. 01/05/23   Atway, Derwood Kaplan, DO  folic acid (FOLVITE) 1  MG tablet Take 1 mg by mouth daily.    [provider]  furosemide (LASIX) 40 MG tablet Take 40 mg by mouth daily.    [provider]  gabapentin (NEURONTIN) 300 MG capsule Take 1 capsule (300 mg total) by mouth 2 (two) times daily. 11/17/22   Atway, Rayann N, DO  hydrALAZINE (APRESOLINE) 25 MG tablet Take 1 tablet (25 mg total) by mouth 3 (three) times daily. 11/18/22   Atway, Rayann N, DO  insulin aspart (NOVOLOG) cartridge Inject into the skin. Use for Freestyle libre pump    [provider]  isosorbide mononitrate (IMDUR) 60 MG 24 hr tablet Take 1.5 tablets  (90 mg total) by mouth daily. 03/11/23 04/10/23  Patel, Amar, DO  KLOR-CON M20 20 MEQ tablet TAKE ONE TABLET BY MOUTH DAILY Patient taking differently: Take 20 mEq by mouth daily. 06/22/22   Laurey Morale, MD  LANTUS SOLOSTAR 100 UNIT/ML Solostar Pen INJECT 25 UNITS INTO THE SKIN EVERY DAY Patient taking differently: 25 Units daily. 03/29/23   Atway, Rayann N, DO  levETIRAcetam (KEPPRA) 500 MG tablet TAKE ONE TABLET BY MOUTH TWICE DAILY 03/29/23   Atway, Rayann N, DO  linaclotide (LINZESS) 290 MCG CAPS capsule Take 1 capsule (290 mcg total) by mouth daily as needed (Constipation). 03/29/23   Atway, Derwood Kaplan, DO  liver oil-zinc oxide (DESITIN) 40 % ointment Apply topically every 4 (four) hours. Patient taking differently: Apply 1 Application topically as needed for irritation. 09/15/22   Marolyn Haller, MD  metoprolol succinate (TOPROL-XL) 50 MG 24 hr tablet Take 1 tablet (50 mg total) by mouth daily. 05/21/23   Rocky Morel, DO  oxyCODONE (ROXICODONE) 5 MG immediate release tablet Take 1 tablet (5 mg total) by mouth every 6 (six) hours as needed for up to 5 days for severe pain. 05/20/23 05/25/23  Rocky Morel, DO  pantoprazole (PROTONIX) 40 MG tablet TAKE ONE TABLET BY MOUTH EVERY DAY 03/29/23   Atway, Rayann N, DO  rivaroxaban (XARELTO) 20 MG TABS tablet Take 1 tablet (20 mg total) by mouth daily with supper. 04/04/23   Atway, Rayann N, DO  spironolactone (ALDACTONE) 100 MG tablet TAKE ONE TABLET BY MOUTH EVERY DAY 03/29/23   Atway, Rayann N, DO  STOOL SOFTENER/LAXATIVE 50-8.6 MG tablet TAKE ONE TABLET BY MOUTH AT BEDTIME AS NEEDED FOR MILD CONSTIPATION Patient taking differently: Take 1 tablet by mouth at bedtime as needed for mild constipation. 01/28/20   Reymundo Poll, MD  traZODone (DESYREL) 100 MG tablet Take 1 tablet (100 mg total) by mouth at bedtime. 04/26/23   Nwoko, Stephens Shire E, PA  TRULICITY 0.75 MG/0.5ML SOPN INJECT 0.75 MG SUBCUTANEOUSLY ONCE WEEKLY Patient taking differently: Inject  into the skin every Wednesday. 03/30/23   Atway, Rayann N, DO  VENTOLIN HFA 108 (90 Base) MCG/ACT inhaler INHALE TWO PUFFS INTO THE LUNGS EVERY 6 HOURS AS NEEDED FOR WHEEZING OR SHORTNESS OF BREATH Patient taking differently: 1-2 puffs every 6 (six) hours as needed for wheezing or shortness of breath. 03/29/23   Atway, Rayann N, DO  Vitamin D, Ergocalciferol, (DRISDOL) 1.25 MG (50000 UNIT) CAPS capsule TAKE ONE CAPSULE BY MOUTH EVERY 7 DAYS 03/30/23   Atway, Rayann N, DO  Zinc Sulfate 220 (50 Zn) MG TABS Take 1 tablet (220 mg total) by mouth daily. 05/21/23   Rocky Morel, DO      Allergies    Ace inhibitors, Cleocin [clindamycin], Enalapril maleate, and Zestril [lisinopril]    Review of Systems   Review  of Systems  Skin:  Positive for wound.  All other systems reviewed and are negative.   Physical Exam Updated Vital Signs BP 112/60   Pulse 78   Temp 98.2 F (36.8 C) (Oral)   Resp 16   Ht 5\' 9"  (1.753 m)   Wt 107 kg   LMP 09/13/2019 (Exact Date)   SpO2 100%   BMI 34.85 kg/m  Physical Exam Vitals and nursing note reviewed.  Constitutional:      General: She is not in acute distress.    Appearance: Normal appearance. She is well-developed. She is not toxic-appearing or diaphoretic.  HENT:     Head: Normocephalic and atraumatic.     Right Ear: External ear normal.     Left Ear: External ear normal.     Nose: Nose normal.     Mouth/Throat:     Mouth: Mucous membranes are moist.  Eyes:     Extraocular Movements: Extraocular movements intact.     Conjunctiva/sclera: Conjunctivae normal.  Cardiovascular:     Rate and Rhythm: Normal rate and regular rhythm.  Pulmonary:     Effort: Pulmonary effort is normal. No respiratory distress.  Chest:     Chest wall: No tenderness.  Abdominal:     General: There is no distension.     Palpations: Abdomen is soft.  Musculoskeletal:        General: No swelling.     Cervical back: Normal range of motion and neck supple.     Comments:  Wound to lateral aspect of right foot.  Sutures remain in place.  No abnormal swelling, erythema, tenderness or pain out of proportion.  Wound is hemostatic.  Skin:    General: Skin is warm and dry.  Neurological:     General: No focal deficit present.     Mental Status: She is alert and oriented to person, place, and time.  Psychiatric:        Mood and Affect: Mood normal.        Behavior: Behavior normal.     ED Results / Procedures / Treatments   Labs (all labs ordered are listed, but only abnormal results are displayed) Labs Reviewed - No data to display  EKG None  Radiology No results found.  Procedures Procedures    Medications Ordered in ED Medications  oxyCODONE-acetaminophen (PERCOCET/ROXICET) 5-325 MG per tablet 1 tablet (1 tablet Oral Given 05/24/23 0352)    ED Course/ Medical Decision Making/ A&P                             Medical Decision Making Risk Prescription drug management.   Patient presents for bleeding from recent surgical site on her right foot in addition to wound VAC dysfunction.  Patient describes a wound VAC leak which was repaired by EMS prior to her arrival.  On arrival, patient is well-appearing.  Vital signs are normal.  Wound VAC was removed and recent surgical site was inspected.  It is hemostatic.  It does appear to be in good condition.  Sutures remain in place.  Area was cleaned and new wound VAC was applied.  Patient currently has a follow-up appointment with Dr. Lajoyce Corners in 1 week.  While in the ED, she was given Percocet for analgesia.  She was discharged in stable condition.        Final Clinical Impression(s) / ED Diagnoses Final diagnoses:  Visit for wound check    Rx /  DC Orders ED Discharge Orders     None         Gloris Manchester, MD 05/24/23 [phone]

## 2023-05-25 NOTE — Transitions of Care (Post Inpatient/ED Visit) (Signed)
   05/25/2023  Name: [name] MRN: [id number] DOB: [birth year]  Today's TOC FU Call Status: Today's TOC FU Call Status:: Unsuccessful Call (3rd Attempt) Unsuccessful Call (1st Attempt) Date: 05/23/23 Unsuccessful Call (2nd Attempt) Date: 05/24/23 Unsuccessful Call (3rd Attempt) Date: 05/25/23  Attempted to reach the patient regarding the most recent Inpatient/ED visit.  Follow Up Plan: No further outreach attempts will be made at this time. We have been unable to contact the patient.  Signature Karena Addison, LPN [facility] Nurse Health Advisor Direct Dial [phone]

## 2023-06-02 ENCOUNTER — Encounter: Payer: Self-pay | Admitting: Student

## 2023-06-02 ENCOUNTER — Ambulatory Visit (INDEPENDENT_AMBULATORY_CARE_PROVIDER_SITE_OTHER): Payer: Medicaid Other | Admitting: Orthopedic Surgery

## 2023-06-02 ENCOUNTER — Ambulatory Visit (INDEPENDENT_AMBULATORY_CARE_PROVIDER_SITE_OTHER): Payer: Medicaid Other | Admitting: Student

## 2023-06-02 ENCOUNTER — Other Ambulatory Visit: Payer: Self-pay

## 2023-06-02 VITALS — BP 152/98 | HR 88 | Temp 97.9°F | Ht 69.0 in

## 2023-06-02 DIAGNOSIS — I10 Essential (primary) hypertension: Secondary | ICD-10-CM

## 2023-06-02 DIAGNOSIS — M86171 Other acute osteomyelitis, right ankle and foot: Secondary | ICD-10-CM

## 2023-06-02 DIAGNOSIS — E1169 Type 2 diabetes mellitus with other specified complication: Secondary | ICD-10-CM

## 2023-06-02 DIAGNOSIS — E1140 Type 2 diabetes mellitus with diabetic neuropathy, unspecified: Secondary | ICD-10-CM

## 2023-06-02 DIAGNOSIS — E1165 Type 2 diabetes mellitus with hyperglycemia: Secondary | ICD-10-CM | POA: Diagnosis not present

## 2023-06-02 DIAGNOSIS — M869 Osteomyelitis, unspecified: Secondary | ICD-10-CM

## 2023-06-02 DIAGNOSIS — Z59819 Housing instability, housed unspecified: Secondary | ICD-10-CM | POA: Diagnosis not present

## 2023-06-02 DIAGNOSIS — Z89421 Acquired absence of other right toe(s): Secondary | ICD-10-CM

## 2023-06-02 DIAGNOSIS — D649 Anemia, unspecified: Secondary | ICD-10-CM

## 2023-06-02 DIAGNOSIS — L304 Erythema intertrigo: Secondary | ICD-10-CM | POA: Diagnosis not present

## 2023-06-02 DIAGNOSIS — Z0000 Encounter for general adult medical examination without abnormal findings: Secondary | ICD-10-CM

## 2023-06-02 DIAGNOSIS — G8918 Other acute postprocedural pain: Secondary | ICD-10-CM

## 2023-06-02 LAB — POCT GLYCOSYLATED HEMOGLOBIN (HGB A1C): Hemoglobin A1C: 9.1 % — AB (ref 4.0–5.6)

## 2023-06-02 LAB — GLUCOSE, CAPILLARY: Glucose-Capillary: 199 mg/dL — ABNORMAL HIGH (ref 70–99)

## 2023-06-02 MED ORDER — KETOCONAZOLE 2 % EX CREA
1.0000 | TOPICAL_CREAM | Freq: Every day | CUTANEOUS | 0 refills | Status: DC
Start: 2023-06-02 — End: 2023-06-23

## 2023-06-02 MED ORDER — TALC EX POWD
CUTANEOUS | 0 refills | Status: AC | PRN
Start: 2023-06-02 — End: ?

## 2023-06-02 MED ORDER — OXYCODONE HCL 5 MG PO TABS
5.0000 mg | ORAL_TABLET | Freq: Four times a day (QID) | ORAL | 0 refills | Status: DC | PRN
Start: 2023-06-02 — End: 2024-03-02

## 2023-06-02 MED ORDER — AMLODIPINE BESYLATE 10 MG PO TABS
10.0000 mg | ORAL_TABLET | Freq: Every day | ORAL | 3 refills | Status: DC
Start: 2023-06-02 — End: 2024-04-13

## 2023-06-02 NOTE — Patient Instructions (Addendum)
  Thank you, [name], for allowing [place] to provide your care today.   Please call Dr. Audrie Lia office, [phone], this afternoon and let them know that you had a right fifth ray amputation on 05/18/2023 and then after this your wound VAC has fallen off.  You let them know that your primary care provider redressed your wound today but they need to be seen soon.  Please also continue with your plans for PT/OT outpatient.  For your blood pressure we are going to restart you on amlodipine 10 mg daily.  You may resume taking your existing prescription.  For your dry skin I think this has to do with a common skin condition called intertrigo.  I have prescribed talcum powder and ketoconazole cream which she should use daily on these areas.  Please also wash the area daily until you are able to shower.  We will check this again when you return.  We have also sent a referral for you to get a mammogram.  I think the burning you have on your breast is not a mass but you are overdue for your screening mammogram so we will send you for this.  Someone should call you to schedule.  I have ordered the following labs for you:  Lab Orders         BMP8+Anion Gap         CBC with Diff         Glucose, capillary         POC Hbg A1C       Tests ordered today:  Mammogram   Referrals ordered today:   Referral Orders  No referral(s) requested today      I have ordered the following medication/changed the following medications:   Stop the following medications: There are no discontinued medications.   Start the following medications: Meds ordered this encounter  Medications   amLODipine (NORVASC) 10 MG tablet    Sig: Take 1 tablet (10 mg total) by mouth daily.    Dispense:  90 tablet    Refill:  3   ketoconazole (NIZORAL) 2 % cream    Sig: Apply 1 Application topically daily.    Dispense:  15 g    Refill:  0   talc powder    Sig: Apply topically as needed.    Dispense:  240 g     Refill:  0   oxyCODONE (ROXICODONE) 5 MG immediate release tablet    Sig: Take 1 tablet (5 mg total) by mouth every 6 (six) hours as needed.    Dispense:  12 tablet    Refill:  0      Follow up:  1 month     Remember:     Should you have any questions or concerns please call the internal medicine clinic at [phone].     Rocky Morel, DO [facility]

## 2023-06-02 NOTE — Progress Notes (Signed)
CC: Hospital follow-up  HPI:  [name] is a 57 y.o. female with PMH as below who presents to the clinic for a hospital follow-up.  Please see assessment and plan for further details.  Past Medical History:  Diagnosis Date   Acute cystitis 06/19/2021   AKI (acute kidney injury) (HCC) 09/13/2022   AKI (acute kidney injury) (HCC) 09/13/2022   Allergic rhinitis    Arthritis    "hips, back, legs, arms" (07/04/2014)   Asthma    hx   Automatic implantable cardioverter-defibrillator in situ    Calcifying tendinitis of shoulder    Chronic combined systolic and diastolic CHF (congestive heart failure) (HCC)    EF 40-45% by echo 12/06/2012   Chronic diastolic heart failure (HCC)     Primarily diastolic CHF: Likely due to uncontrolled HTN. Last echo (8/12) with EF 45-50%, mild to moderate LVH with some asymmetric septal hypertrophy, RV normal size and systolic function. EF 50-55% by LV-gram in 6/12.    Chronic lower back pain    secondary to DJD, obsetiy, hip problems. Followed by Dr. Ivory Broad (pain management)   Chronic systolic heart failure (HCC) 05/17/2022   Chronic use of opiate for therapeutic purpose 12/14/2016   Contact with and (suspected) exposure to covid-19 02/07/2020   Coronary artery disease    questionable. LHC 05/2011 showing normal coronaries // Followed at [facility], Dr. Shirlee Latch   Degeneration of lumbar or lumbosacral intervertebral disc    DJD (degenerative joint disease) of hip    right sided   Fatigue 04/27/2022   Frequent UTI    GERD (gastroesophageal reflux disease)    HLD (hyperlipidemia)    Hypertension    Poorly controlled. Has had HTN since age 64. Angioedema with ACEI.  24 Hr urine and renal arterial dopplers ordered . . . Never done   LBBB (left bundle branch block)    Left shoulder pain 06/30/2015   Left spastic hemiparesis (HCC) 09/23/2015   Liver disease    Lumbago 11/22/2016   Morbid obesity (HCC)    Muscle spasm 06/19/2021    Need for pneumococcal vaccine 09/09/2017   NICM (nonischemic cardiomyopathy) (HCC)    EF 45-50% in 8/12, cath 6/12 showed normal coronaries, EF 50-55% by LV gram   OSA on CPAP    sleep study in 8/12 showed moderate to severe OSA requiring CPAP   Perimenopausal 03/28/2017   Polyneuropathy in diabetes(357.2)    Presence of permanent cardiac pacemaker    Rash 07/14/2016   Seizures (HCC)    last 3 months   Shortness of breath    none now   Stroke [facility]) 12/2013   "my left side is paralyzed" (07/04/2014)   Thoracic or lumbosacral neuritis or radiculitis, unspecified    Type II diabetes mellitus (HCC) DX: 2002   Urinary frequency 11/05/2014   Vaginal discharge 02/05/2021   Review of Systems:   Pertinent items noted in HPI and/or A&P.  Physical Exam:  Vitals:   06/02/23 1107  BP: (!) 152/98  Pulse: 88  Temp: 97.9 F (36.6 C)  TempSrc: Oral  SpO2: 99%  Height: 5\' 9"  (1.753 m)    Constitutional: Tired and anxious appearing middle-aged female. In no acute distress. Cardio:Regular rate and rhythm. 2+ bilateral radial pulses. Pulm: Normal work of breathing on room air. QIH:[id number] for extremity edema.  Right foot fifth ray amputation incision with sutures in place, appears to be healing well with 1 minor spot of dehiscence, no surrounding edema, erythema, or  drainage. Skin: Mild erythematous skin breakdown in the skin folds of the axilla Neuro:Alert and oriented x3. No focal deficit noted. Psych:Pleasant mood and affect.   Assessment & Plan:   Osteomyelitis of fifth toe of right foot [facility]) Patient presents for hospital follow-up after being admitted with RLE cellulitis 2/2 chronic osteomyelitis confined to the distal right fifth metatarsal and underwent right fifth ray amputation on 6/5 with 3 days of doxycycline and cefadroxil after surgery.  Blood cultures from 05/16/2023 remain negative.  She was post to follow-up with orthopedics last week but missed this appointment due to a mix  up.  Her wound VAC had a leak on 05/23/2023 and she went to the ED where this was replaced but unfortunately the next day her wound VAC fell off.  She has it wrapped in gauze and tape today.  She has also not been able to start PT/OT because of housing issue.  [place] now [place] [address] and is getting set up with PT and OT there.  Wound is undressed today and appears to be overall healing well with 1 area of small dehiscence without discharge or bleeding.  Wound was dressed in our clinic and she was instructed to call Dr. Audrie Lia office to be seen as soon as possible.  Intertrigo Has some areas of dry itchy skin on the skin folds around her axilla and buttocks.  Physical exam consistent with intertrigo without significant skin breakdown.  Will treat with talcum powder and ketoconazole cream.  Benign essential HTN Blood pressure today 152/98.  She had some low blood pressures during admission and her amlodipine was held on discharge.  This will be restarted. - Restart amlodipine 10 mg daily and continue hydralazine 25 mg 3 times daily, isosorbide mononitrate 90 mg daily, metoprolol succinate 50 mg daily, and spironolactone 100 mg daily - BMP today  Health care maintenance Patient does complain of a lump on her left breast that occurred after dropping some hot oil on her breast.  On exam she does have a circular scarred burn on her left breast above the areola without underlying breast tissue abnormality.  Remainder of bilateral breast exam without significant abnormality.  She is overdue for her mammogram. - Referral for screening mammogram sent  Housing instability Patient had a very unfortunate series of events after she plan to move out of her rental home but was hospitalized during this time.  Her landlord throughout all her stuff and she lost a lot of her stuff including her wound supplies.  I spoke to Meg from [place] who asked me to put in a TCL consult I will help her with her housing  instability amongst other things.    Patient discussed with Dr. Julieanne Cotton, DO [facility] Internal [facility] Resident PGY-1 Pager: [phone]

## 2023-06-03 LAB — CBC WITH DIFFERENTIAL/PLATELET
Basophils Absolute: 0.1 10*3/uL (ref 0.0–0.2)
Basos: 1 %
EOS (ABSOLUTE): 0.2 10*3/uL (ref 0.0–0.4)
Eos: 2 %
Hematocrit: 34.3 % (ref 34.0–46.6)
Hemoglobin: 10.9 g/dL — ABNORMAL LOW (ref 11.1–15.9)
Immature Grans (Abs): 0 10*3/uL (ref 0.0–0.1)
Immature Granulocytes: 0 %
Lymphocytes Absolute: 2.4 10*3/uL (ref 0.7–3.1)
Lymphs: 28 %
MCH: 26.4 pg — ABNORMAL LOW (ref 26.6–33.0)
MCHC: 31.8 g/dL (ref 31.5–35.7)
MCV: 83 fL (ref 79–97)
Monocytes Absolute: 0.7 10*3/uL (ref 0.1–0.9)
Monocytes: 9 %
Neutrophils Absolute: 5.1 10*3/uL (ref 1.4–7.0)
Neutrophils: 60 %
Platelets: 361 10*3/uL (ref 150–450)
RBC: 4.13 x10E6/uL (ref 3.77–5.28)
RDW: 13.6 % (ref 11.7–15.4)
WBC: 8.4 10*3/uL (ref 3.4–10.8)

## 2023-06-03 LAB — BMP8+ANION GAP
Anion Gap: 15 mmol/L (ref 10.0–18.0)
BUN/Creatinine Ratio: 6 — ABNORMAL LOW (ref 9–23)
BUN: 5 mg/dL — ABNORMAL LOW (ref 6–24)
CO2: 27 mmol/L (ref 20–29)
Calcium: 9.6 mg/dL (ref 8.7–10.2)
Chloride: 97 mmol/L (ref 96–106)
Creatinine, Ser: 0.79 mg/dL (ref 0.57–1.00)
Glucose: 213 mg/dL — ABNORMAL HIGH (ref 70–99)
Potassium: 4.4 mmol/L (ref 3.5–5.2)
Sodium: 139 mmol/L (ref 134–144)
eGFR: 88 mL/min/{1.73_m2} (ref >59)

## 2023-06-06 NOTE — Assessment & Plan Note (Signed)
Patient had a very unfortunate series of events after she plan to move out of her rental home but was hospitalized during this time.  Her landlord throughout all her stuff and she lost a lot of her stuff including her wound supplies.  I spoke to Meg from [place] who asked me to put in a TCL consult I will help her with her housing instability amongst other things.

## 2023-06-06 NOTE — Progress Notes (Signed)
Called and discussed results of CBC, BMP, and A1c.  A1c improved to 9.1, hemoglobin improved to 10.9, and BMP stable from prior without renal dysfunction or electrolyte abnormalities.  No changes in management at this time.

## 2023-06-06 NOTE — Assessment & Plan Note (Addendum)
Blood pressure today 152/98.  She had some low blood pressures during admission and her amlodipine was held on discharge.  This will be restarted. - Restart amlodipine 10 mg daily and continue hydralazine 25 mg 3 times daily, isosorbide mononitrate 90 mg daily, metoprolol succinate 50 mg daily, and spironolactone 100 mg daily - BMP today

## 2023-06-06 NOTE — Assessment & Plan Note (Signed)
Patient does complain of a lump on her left breast that occurred after dropping some hot oil on her breast.  On exam she does have a circular scarred burn on her left breast above the areola without underlying breast tissue abnormality.  Remainder of bilateral breast exam without significant abnormality.  She is overdue for her mammogram. - Referral for screening mammogram sent

## 2023-06-06 NOTE — Assessment & Plan Note (Signed)
Patient presents for hospital follow-up after being admitted with RLE cellulitis 2/2 chronic osteomyelitis confined to the distal right fifth metatarsal and underwent right fifth ray amputation on 6/5 with 3 days of doxycycline and cefadroxil after surgery.  Blood cultures from 05/16/2023 remain negative.  She was post to follow-up with orthopedics last week but missed this appointment due to a mix up.  Her wound VAC had a leak on 05/23/2023 and she went to the ED where this was replaced but unfortunately the next day her wound VAC fell off.  She has it wrapped in gauze and tape today.  She has also not been able to start PT/OT because of housing issue.  She is now at the motel [address] and is getting set up with PT and OT there.  Wound is undressed today and appears to be overall healing well with 1 area of small dehiscence without discharge or bleeding.  Wound was dressed in our clinic and she was instructed to call Dr. Audrie Lia office to be seen as soon as possible.

## 2023-06-06 NOTE — Assessment & Plan Note (Signed)
Has some areas of dry itchy skin on the skin folds around her axilla and buttocks.  Physical exam consistent with intertrigo without significant skin breakdown.  Will treat with talcum powder and ketoconazole cream.

## 2023-06-07 ENCOUNTER — Telehealth (HOSPITAL_COMMUNITY): Payer: Medicaid Other | Admitting: Physician Assistant

## 2023-06-07 ENCOUNTER — Encounter (HOSPITAL_COMMUNITY): Payer: Self-pay

## 2023-06-07 NOTE — Progress Notes (Signed)
Internal Medicine Clinic Attending  Case discussed with Dr. Goodwin  At the time of the visit.  We reviewed the resident's history and exam and pertinent patient test results.  I agree with the assessment, diagnosis, and plan of care documented in the resident's note.  

## 2023-06-09 ENCOUNTER — Other Ambulatory Visit: Payer: Self-pay | Admitting: Internal Medicine

## 2023-06-09 ENCOUNTER — Other Ambulatory Visit (HOSPITAL_COMMUNITY): Payer: Self-pay | Admitting: Cardiology

## 2023-06-09 ENCOUNTER — Encounter: Payer: Medicaid Other | Admitting: Orthopedic Surgery

## 2023-06-09 DIAGNOSIS — I5042 Chronic combined systolic (congestive) and diastolic (congestive) heart failure: Secondary | ICD-10-CM

## 2023-06-12 ENCOUNTER — Other Ambulatory Visit (HOSPITAL_COMMUNITY): Payer: Self-pay | Admitting: Physician Assistant

## 2023-06-12 DIAGNOSIS — F331 Major depressive disorder, recurrent, moderate: Secondary | ICD-10-CM

## 2023-06-12 DIAGNOSIS — F411 Generalized anxiety disorder: Secondary | ICD-10-CM

## 2023-06-12 DIAGNOSIS — F4310 Post-traumatic stress disorder, unspecified: Secondary | ICD-10-CM

## 2023-06-12 DIAGNOSIS — F5105 Insomnia due to other mental disorder: Secondary | ICD-10-CM

## 2023-06-14 ENCOUNTER — Telehealth: Payer: Self-pay | Admitting: *Deleted

## 2023-06-14 NOTE — Telephone Encounter (Signed)
Call from patient ststes is having a problem paying her light bill.  Need referral to [facility] [place] to see if she can offer assistance to the patient.

## 2023-06-15 ENCOUNTER — Telehealth: Payer: Self-pay | Admitting: *Deleted

## 2023-06-15 NOTE — Telephone Encounter (Signed)
Call from patient requesting an [place]-2 Form so that she may be able to get an apartment.  Patient also requesting a list of her medications.  Patient was asked to contact her Case Worker to start the process.  Patient is sending message to her Case Worker to get the paperwork started so that the Clinics will be able to complete the forms for her.

## 2023-06-17 ENCOUNTER — Encounter: Payer: Medicaid Other | Admitting: Family

## 2023-06-20 ENCOUNTER — Encounter: Payer: Self-pay | Admitting: Orthopedic Surgery

## 2023-06-20 ENCOUNTER — Telehealth: Payer: Self-pay | Admitting: Podiatry

## 2023-06-20 DIAGNOSIS — E1140 Type 2 diabetes mellitus with diabetic neuropathy, unspecified: Secondary | ICD-10-CM

## 2023-06-20 DIAGNOSIS — M2041 Other hammer toe(s) (acquired), right foot: Secondary | ICD-10-CM

## 2023-06-20 NOTE — Telephone Encounter (Signed)
Pt called stating an order was sent to [facility] for the brace but she is also needing a rx sent to them for her diabeitc shoes please.

## 2023-06-20 NOTE — Progress Notes (Signed)
Office Visit Note   Patient: [name]           Date of Birth: 1966/03/09           MRN: [id number] Visit Date: 06/02/2023              Requested by: Chauncey Mann, DO [address] [place],  [place] [address] PCP: Chauncey Mann, DO  Chief Complaint  Patient presents with   Right Foot - Routine Post Op    05/18/2023 5th toe amputation       HPI: Patient is a 57 year old woman who presents 2 weeks status post right foot fifth ray amputation.  Assessment & Plan: Visit Diagnoses: No diagnosis found.  Plan: Recommended  Dial soap cleansing and elevation with minimizing weightbearing.  Follow-Up Instructions: Return in about 1 week (around 06/09/2023).   Ortho Exam  Patient is alert, oriented, no adenopathy, well-dressed, normal affect, normal respiratory effort. Examination incision is well-approximated there is increased swelling.  No cellulitis or drainage.  Imaging: No results found. No images are attached to the encounter.  Labs: Lab Results  Component Value Date   HGBA1C 9.1 (A) 06/02/2023   HGBA1C 10.4 (H) 03/06/2023   HGBA1C 13.3 (H) 09/13/2022   REPTSTATUS 05/21/2023 FINAL 05/16/2023   GRAMSTAIN  03/06/2023    FEW GRAM POSITIVE COCCI IN PAIRS RARE WBC PRESENT, PREDOMINANTLY PMN    CULT  05/16/2023    NO GROWTH 5 DAYS Performed at [facility], [address] [place], [place] [address]    [facility] STAPHYLOCOCCUS LUGDUNENSIS 03/06/2023   LABORGA STAPHYLOCOCCUS WARNERI 03/06/2023     Lab Results  Component Value Date   ALBUMIN 2.3 (L) 05/20/2023   ALBUMIN 2.2 (L) 05/19/2023   ALBUMIN 2.3 (L) 05/18/2023   PREALBUMIN 17 06/11/2015    Lab Results  Component Value Date   MG 1.7 05/18/2023   MG 1.9 05/17/2023   MG 1.1 (L) 05/16/2023   Lab Results  Component Value Date   VD25OH 14.7 (L) 11/18/2022   VD25OH 19.9 (L) 11/17/2021   VD25OH 20 (L) 07/24/2014    Lab Results  Component Value Date   PREALBUMIN 17 06/11/2015       Latest Ref Rng & Units 06/02/2023   11:52 AM 05/20/2023    8:56 AM 05/19/2023    1:26 AM  CBC EXTENDED  WBC 3.4 - 10.8 x10E3/uL 8.4  15.7  13.1   RBC 3.77 - 5.28 x10E6/uL 4.13  3.37  3.32   Hemoglobin 11.1 - 15.9 g/dL 78.4  8.9  8.9   HCT 69.6 - 46.6 % 34.3  28.5  28.5   Platelets 150 - 450 x10E3/uL 361  227  206   NEUT# 1.4 - 7.0 x10E3/uL 5.1  12.0  9.6   Lymph# 0.7 - 3.1 x10E3/uL 2.4  2.4  2.2      There is no height or weight on file to calculate BMI.  Orders:  No orders of the defined types were placed in this encounter.  No orders of the defined types were placed in this encounter.    Procedures: No procedures performed  Clinical Data: No additional findings.  ROS:  All other systems negative, except as noted in the HPI. Review of Systems  Objective: Vital Signs: LMP 09/13/2019 (Exact Date)   Specialty Comments:  No specialty comments available.  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   Atrial fibrillation (HCC) 09/13/2019   Vitamin B12 deficiency 09/15/2017   Lumbar radiculopathy 08/03/2017   Constipation 09/09/2015   Localization-related symptomatic epilepsy and epileptic syndromes with complex partial seizures, not intractable, without status epilepticus (HCC) 06/13/2015   Hemiparesis affecting left side as late effect of cerebrovascular accident (HCC) 06/13/2015   Morbid obesity (HCC) 06/13/2015   Intertrigo 11/05/2014   Vitamin D deficiency 10/01/2014   Tachycardia 07/04/2014   ICD  (implantable cardioverter-defibrillator) in place 11/23/2013   Mixed stress and urge urinary incontinence 10/11/2013   Health care maintenance 09/13/2013   Chronic combined systolic and diastolic heart failure (HCC) 08/01/2013   Nonischemic cardiomyopathy (HCC) 08/01/2013   LBBB (left bundle branch block) 07/19/2013   Depression 04/09/2013   Primary osteoarthritis of left knee 04/14/2012   Lumbar spondylosis 04/14/2012   Poorly controlled type 2 diabetes mellitus with neuropathy (HCC) 04/14/2012   Hyperlipidemia 11/23/2011   OSA (obstructive sleep apnea) 08/27/2011   Benign essential HTN 08/27/2010   Past Medical History:  Diagnosis Date   Acute cystitis 06/19/2021   AKI (acute kidney injury) (HCC) 09/13/2022   AKI (acute kidney injury) (HCC) 09/13/2022   Allergic rhinitis    Arthritis    "hips, back, legs, arms" (07/04/2014)   Asthma    hx   Automatic implantable cardioverter-defibrillator in situ    Calcifying tendinitis of shoulder    Chronic combined systolic and diastolic CHF (congestive heart failure) (HCC)    EF 40-45% by echo 12/06/2012   Chronic diastolic heart failure (HCC)     Primarily diastolic CHF: Likely due to uncontrolled HTN. Last echo (8/12) with EF 45-50%, mild to moderate LVH with some asymmetric septal hypertrophy, RV normal size and systolic function. EF 50-55% by LV-gram in 6/12.    Chronic lower back pain    secondary to DJD, obsetiy, hip problems. Followed by Dr. Ivory Broad (pain management)   Chronic systolic heart failure (HCC) 05/17/2022   Chronic use of opiate for therapeutic purpose 12/14/2016   Contact with and (suspected) exposure to covid-19 02/07/2020   Coronary artery disease    questionable. LHC 05/2011 showing normal coronaries // Followed at [facility], Dr. Shirlee Latch   Degeneration of lumbar or lumbosacral intervertebral disc    DJD (degenerative joint disease) of hip    right sided   Fatigue 04/27/2022   Frequent UTI    GERD  (gastroesophageal reflux disease)    HLD (hyperlipidemia)    Hypertension    Poorly controlled. Has had HTN since age 89. Angioedema with ACEI.  24 Hr urine and renal arterial dopplers ordered . . . Never done   LBBB (left bundle branch block)    Left shoulder pain 06/30/2015   Left spastic hemiparesis (HCC) 09/23/2015   Liver disease    Lumbago 11/22/2016   Morbid obesity (HCC)    Muscle spasm 06/19/2021   Need for pneumococcal vaccine 09/09/2017   NICM (nonischemic cardiomyopathy) (HCC)    EF 45-50% in 8/12, cath 6/12 showed normal coronaries, EF 50-55% by LV gram   OSA on CPAP    sleep study in 8/12 showed moderate  to severe OSA requiring CPAP   Perimenopausal 03/28/2017   Polyneuropathy in diabetes(357.2)    Presence of permanent cardiac pacemaker    Rash 07/14/2016   Seizures (HCC)    last 3 months   Shortness of breath    none now   Stroke [facility]) 12/2013   "my left side is paralyzed" (07/04/2014)   Thoracic or lumbosacral neuritis or radiculitis, unspecified    Type II diabetes mellitus (HCC) DX: 2002   Urinary frequency 11/05/2014   Vaginal discharge 02/05/2021    Family History  Problem Relation Age of Onset   Heart disease Mother 11       Died of MI at age 54 yo   Kidney disease Mother        requiring dialysis   Congestive Heart Failure Mother    Heart disease Father 30       MI age 64yo requiring stenting   Diabetes Father    Glaucoma Father    Heart disease Paternal Grandmother        requiring pacemaker.   Heart disease Paternal Grandfather 25       Died of MI at possibly age 82-53yo   Stroke Paternal Grandfather    Diabetes Brother    Heart disease Brother 43       MI at age 65 years old   Breast cancer Paternal Aunt    Breast cancer Maternal Grandmother     Past Surgical History:  Procedure Laterality Date   AMPUTATION Right 05/18/2023   Procedure: RIGHT FOTT 5TH TOE AMPUTATION;  Surgeon: [name], MD;  Location: [facility] OR;  Service: Orthopedics;   Laterality: Right;   BI-VENTRICULAR IMPLANTABLE CARDIOVERTER DEFIBRILLATOR N/A 08/22/2013   Procedure: BI-VENTRICULAR IMPLANTABLE CARDIOVERTER DEFIBRILLATOR  (CRT-D);  Surgeon: [name], MD;  Location: [facility] CATH LAB;  Service: Cardiovascular;  Laterality: N/A;   BI-VENTRICULAR IMPLANTABLE CARDIOVERTER DEFIBRILLATOR  (CRT-D)  08/2013   [name] 08/23/2013   BIV ICD GENERATOR CHANGEOUT N/A 03/18/2021   Procedure: BIV ICD GENERATOR CHANGEOUT;  Surgeon: [name], MD;  Location: [facility] INVASIVE CV LAB;  Service: Cardiovascular;  Laterality: N/A;   BREAST SURGERY Bilateral 2011   patient reports benign results   CARDIAC CATHETERIZATION  05/2011   CARPAL TUNNEL RELEASE Left    denies   HEMIARTHROPLASTY SHOULDER FRACTURE Right 1980's   denies   I & D EXTREMITY Right 03/06/2023   Procedure: IRRIGATION AND DEBRIDEMENT OF RIGHT FOOT;  Surgeon: [name], MD;  Location: [facility] OR;  Service: Orthopedics;  Laterality: Right;   I & D EXTREMITY Right 03/10/2023   Procedure: RIGHT FOOT INCISION AND DEBRIDEMENT;  Surgeon: [name], MD;  Location: [facility] OR;  Service: Orthopedics;  Laterality: Right;   MULTIPLE EXTRACTIONS WITH ALVEOLOPLASTY Bilateral 05/20/2017   Procedure: MULTIPLE EXTRACTION;  Surgeon: [name], DDS;  Location: [facility] OR;  Service: Oral Surgery;  Laterality: Bilateral;   MULTIPLE TOOTH EXTRACTIONS  ~ 2011   tumors removed ; "my whole top"   SHOULDER ARTHROSCOPY Right 12/26/2015   Procedure: Right Shoulder Arthroscopy, Debridement, and Decompression;  Surgeon: [name], MD;  Location: [facility] OR;  Service: Orthopedics;  Laterality: Right;   TEE WITHOUT CARDIOVERSION N/A 01/14/2014   Procedure: TRANSESOPHAGEAL ECHOCARDIOGRAM (TEE);  Surgeon: Thurmon Fair, MD;  Location: [facility] ENDOSCOPY;  Service: Cardiovascular;  Laterality: N/A;   TUBAL LIGATION  05/31/1985   Social History   Occupational History   Occupation: Unemployed    Comment: planning on getting disability  Tobacco Use    Smoking status: Never   Smokeless tobacco: Never  Vaping Use   Vaping Use: Never used  Substance and Sexual Activity   Alcohol use: No    Alcohol/week: 0.0 standard drinks of alcohol   Drug use: No   Sexual activity: Not on file

## 2023-06-22 ENCOUNTER — Other Ambulatory Visit: Payer: Self-pay | Admitting: Student

## 2023-06-22 DIAGNOSIS — L304 Erythema intertrigo: Secondary | ICD-10-CM

## 2023-06-22 NOTE — Telephone Encounter (Signed)
Lvm for pt that the rx was faxed to [facility].

## 2023-06-27 ENCOUNTER — Other Ambulatory Visit: Payer: Self-pay | Admitting: Internal Medicine

## 2023-06-27 ENCOUNTER — Other Ambulatory Visit (HOSPITAL_COMMUNITY): Payer: Self-pay | Admitting: Cardiology

## 2023-06-27 NOTE — Telephone Encounter (Signed)
Next appt scheduled 7/30 with Dr Daiva Eves.

## 2023-06-29 ENCOUNTER — Telehealth: Payer: Self-pay

## 2023-06-29 ENCOUNTER — Encounter: Payer: Self-pay | Admitting: Family

## 2023-06-29 ENCOUNTER — Other Ambulatory Visit (HOSPITAL_COMMUNITY): Payer: Self-pay | Admitting: Physician Assistant

## 2023-06-29 ENCOUNTER — Ambulatory Visit: Payer: Medicaid Other | Admitting: Family

## 2023-06-29 ENCOUNTER — Ambulatory Visit (HOSPITAL_COMMUNITY)
Admission: RE | Admit: 2023-06-29 | Discharge: 2023-06-29 | Disposition: A | Discharge status: Home / Self Care | Payer: Medicaid Other | Source: Ambulatory Visit | Attending: Family Medicine | Admitting: Family Medicine

## 2023-06-29 ENCOUNTER — Encounter (HOSPITAL_COMMUNITY): Payer: Self-pay

## 2023-06-29 VITALS — BP 158/84 | HR 87 | Wt 241.6 lb

## 2023-06-29 DIAGNOSIS — I69344 Monoplegia of lower limb following cerebral infarction affecting left non-dominant side: Secondary | ICD-10-CM | POA: Insufficient documentation

## 2023-06-29 DIAGNOSIS — I5022 Chronic systolic (congestive) heart failure: Secondary | ICD-10-CM

## 2023-06-29 DIAGNOSIS — G4733 Obstructive sleep apnea (adult) (pediatric): Secondary | ICD-10-CM | POA: Diagnosis not present

## 2023-06-29 DIAGNOSIS — I480 Paroxysmal atrial fibrillation: Secondary | ICD-10-CM | POA: Diagnosis not present

## 2023-06-29 DIAGNOSIS — G8929 Other chronic pain: Secondary | ICD-10-CM | POA: Diagnosis not present

## 2023-06-29 DIAGNOSIS — E1159 Type 2 diabetes mellitus with other circulatory complications: Secondary | ICD-10-CM

## 2023-06-29 DIAGNOSIS — R531 Weakness: Secondary | ICD-10-CM | POA: Insufficient documentation

## 2023-06-29 DIAGNOSIS — E785 Hyperlipidemia, unspecified: Secondary | ICD-10-CM | POA: Insufficient documentation

## 2023-06-29 DIAGNOSIS — J45909 Unspecified asthma, uncomplicated: Secondary | ICD-10-CM | POA: Diagnosis not present

## 2023-06-29 DIAGNOSIS — I2510 Atherosclerotic heart disease of native coronary artery without angina pectoris: Secondary | ICD-10-CM | POA: Insufficient documentation

## 2023-06-29 DIAGNOSIS — E1142 Type 2 diabetes mellitus with diabetic polyneuropathy: Secondary | ICD-10-CM | POA: Insufficient documentation

## 2023-06-29 DIAGNOSIS — I428 Other cardiomyopathies: Secondary | ICD-10-CM | POA: Insufficient documentation

## 2023-06-29 DIAGNOSIS — Z6835 Body mass index (BMI) 35.0-35.9, adult: Secondary | ICD-10-CM | POA: Diagnosis not present

## 2023-06-29 DIAGNOSIS — Z89421 Acquired absence of other right toe(s): Secondary | ICD-10-CM

## 2023-06-29 DIAGNOSIS — M79661 Pain in right lower leg: Secondary | ICD-10-CM

## 2023-06-29 DIAGNOSIS — I110 Hypertensive heart disease with heart failure: Secondary | ICD-10-CM | POA: Insufficient documentation

## 2023-06-29 DIAGNOSIS — N3080 Other cystitis without hematuria: Secondary | ICD-10-CM | POA: Insufficient documentation

## 2023-06-29 DIAGNOSIS — Z794 Long term (current) use of insulin: Secondary | ICD-10-CM | POA: Diagnosis not present

## 2023-06-29 DIAGNOSIS — Z79899 Other long term (current) drug therapy: Secondary | ICD-10-CM | POA: Insufficient documentation

## 2023-06-29 DIAGNOSIS — I447 Left bundle-branch block, unspecified: Secondary | ICD-10-CM | POA: Insufficient documentation

## 2023-06-29 DIAGNOSIS — Z95810 Presence of automatic (implantable) cardiac defibrillator: Secondary | ICD-10-CM | POA: Insufficient documentation

## 2023-06-29 DIAGNOSIS — F5105 Insomnia due to other mental disorder: Secondary | ICD-10-CM

## 2023-06-29 DIAGNOSIS — I739 Peripheral vascular disease, unspecified: Secondary | ICD-10-CM

## 2023-06-29 DIAGNOSIS — Z791 Long term (current) use of non-steroidal anti-inflammatories (NSAID): Secondary | ICD-10-CM | POA: Insufficient documentation

## 2023-06-29 DIAGNOSIS — I5043 Acute on chronic combined systolic (congestive) and diastolic (congestive) heart failure: Secondary | ICD-10-CM | POA: Insufficient documentation

## 2023-06-29 DIAGNOSIS — I639 Cerebral infarction, unspecified: Secondary | ICD-10-CM

## 2023-06-29 DIAGNOSIS — F4310 Post-traumatic stress disorder, unspecified: Secondary | ICD-10-CM

## 2023-06-29 DIAGNOSIS — N898 Other specified noninflammatory disorders of vagina: Secondary | ICD-10-CM | POA: Insufficient documentation

## 2023-06-29 DIAGNOSIS — E1165 Type 2 diabetes mellitus with hyperglycemia: Secondary | ICD-10-CM | POA: Insufficient documentation

## 2023-06-29 DIAGNOSIS — Z7901 Long term (current) use of anticoagulants: Secondary | ICD-10-CM | POA: Diagnosis not present

## 2023-06-29 DIAGNOSIS — I10 Essential (primary) hypertension: Secondary | ICD-10-CM

## 2023-06-29 DIAGNOSIS — F331 Major depressive disorder, recurrent, moderate: Secondary | ICD-10-CM

## 2023-06-29 DIAGNOSIS — K219 Gastro-esophageal reflux disease without esophagitis: Secondary | ICD-10-CM | POA: Insufficient documentation

## 2023-06-29 DIAGNOSIS — Z7985 Long-term (current) use of injectable non-insulin antidiabetic drugs: Secondary | ICD-10-CM | POA: Diagnosis not present

## 2023-06-29 DIAGNOSIS — Z8616 Personal history of COVID-19: Secondary | ICD-10-CM | POA: Insufficient documentation

## 2023-06-29 DIAGNOSIS — I4891 Unspecified atrial fibrillation: Secondary | ICD-10-CM

## 2023-06-29 DIAGNOSIS — F411 Generalized anxiety disorder: Secondary | ICD-10-CM

## 2023-06-29 LAB — BASIC METABOLIC PANEL
Anion gap: 8 (ref 5–15)
BUN: 6 mg/dL (ref 6–20)
CO2: 27 mmol/L (ref 22–32)
Calcium: 9.3 mg/dL (ref 8.9–10.3)
Chloride: 100 mmol/L (ref 98–111)
Creatinine, Ser: 0.84 mg/dL (ref 0.44–1.00)
GFR, Estimated: >60 mL/min (ref >60)
Glucose, Bld: 165 mg/dL — ABNORMAL HIGH (ref 70–99)
Potassium: 3.9 mmol/L (ref 3.5–5.1)
Sodium: 135 mmol/L (ref 135–145)

## 2023-06-29 LAB — CBC
HCT: 36.7 % (ref 36.0–46.0)
Hemoglobin: 11.3 g/dL — ABNORMAL LOW (ref 12.0–15.0)
MCH: 26 pg (ref 26.0–34.0)
MCHC: 30.8 g/dL (ref 30.0–36.0)
MCV: 84.6 fL (ref 80.0–100.0)
Platelets: 252 10*3/uL (ref 150–400)
RBC: 4.34 MIL/uL (ref 3.87–5.11)
RDW: 13.4 % (ref 11.5–15.5)
WBC: 8.3 10*3/uL (ref 4.0–10.5)
nRBC: 0 % (ref 0.0–0.2)

## 2023-06-29 LAB — BRAIN NATRIURETIC PEPTIDE: B Natriuretic Peptide: 16.9 pg/mL (ref 0.0–100.0)

## 2023-06-29 MED ORDER — FUROSEMIDE 40 MG PO TABS
40.0000 mg | ORAL_TABLET | Freq: Every day | ORAL | 0 refills | Status: DC
Start: 2023-06-29 — End: 2023-07-27

## 2023-06-29 MED ORDER — POTASSIUM CHLORIDE CRYS ER 20 MEQ PO TBCR
20.0000 meq | EXTENDED_RELEASE_TABLET | Freq: Every day | ORAL | 3 refills | Status: DC
Start: 2023-06-29 — End: 2023-07-27

## 2023-06-29 NOTE — Patient Instructions (Addendum)
Labs today - will call you if abnormal. Repeat labs in 10 days - see below. Start taking Lasix 40 mg daily. Start taking potassium 20 meq daily. Return to [facility] Failure [facility] in 4 weeks - see below. Return to see Dr. Shirlee Latch with echo in 3 months. Please call [phone] in September to schedule this appointment.  Please call us at [phone] if any questions or concerns prior to that visit.

## 2023-06-29 NOTE — Telephone Encounter (Signed)
Pt called stated that she got a call from [facility] for her diabetic shoes.. she stated that they told her they need to notes stating her diabetic foot exam was completed  to be faxed over

## 2023-06-29 NOTE — Progress Notes (Signed)
Post-Op Visit Note   Patient: [name]           Date of Birth: [birth year]           MRN: [id number] Visit Date: 06/29/2023 PCP: Chauncey Mann, DO  Chief Complaint:  Chief Complaint  Patient presents with   Right Foot - Routine Post Op    05/18/2023 right foot 5th ray amputation     HPI:  HPI The patient is a 57 year old woman who presents today status post right fifth ray amputation.  No concerns of her incision however she does complain of continued significant swelling and pain in her calf.  There are some associated bruising.  She states this has been present since her surgery and has not improved at all.  She has been attempting to elevate. Ortho Exam On examination right foot sutures harvested the incision is well-healed there is no gaping drainage no sign of infection.  She does have significant pitting edema with dimpling of the skin up to the tibial tubercle the calf is tight.  There is diffuse tenderness.  Ecchymosis present  Visit Diagnoses: No diagnosis found.  Plan: Well-healed fifth ray amputation.  Concern for DVT right lower extremity will set her up for an ultrasound.  Discussed compression and elevation  Follow-Up Instructions: No follow-ups on file.   Imaging: No results found.  Orders:  No orders of the defined types were placed in this encounter.  No orders of the defined types were placed in this encounter.    PMFS History: Patient Active Problem List   Diagnosis Date Noted   Osteomyelitis of fifth toe of right foot (HCC) 05/16/2023   Type 2 diabetes mellitus with hyperglycemia, with long-term current use of insulin (HCC) 03/07/2023   Diabetic foot infection (HCC) 03/05/2023   PTSD (post-traumatic stress disorder) 01/20/2023   Anxiety state 01/20/2023   Insomnia due to other mental disorder 01/20/2023   Housing instability 11/18/2022   Cough 11/02/2022   Tinea cruris 09/13/2022   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   Atrial fibrillation  (HCC) 09/13/2019   Vitamin B12 deficiency 09/15/2017   Lumbar radiculopathy 08/03/2017   Constipation 09/09/2015   Localization-related symptomatic epilepsy and epileptic syndromes with complex partial seizures, not intractable, without status epilepticus (HCC) 06/13/2015   Hemiparesis affecting left side as late effect of cerebrovascular accident (HCC) 06/13/2015   Morbid obesity (HCC) 06/13/2015   Intertrigo 11/05/2014   Vitamin D deficiency 10/01/2014   Tachycardia 07/04/2014   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/23/2013   Mixed stress and urge urinary incontinence 10/11/2013   Health care maintenance 09/13/2013   Chronic combined systolic and diastolic heart failure (HCC) 08/01/2013   Nonischemic cardiomyopathy (HCC) 08/01/2013   LBBB (left bundle branch block) 07/19/2013   Depression 04/09/2013   Primary osteoarthritis of left knee 04/14/2012   Lumbar spondylosis 04/14/2012   Poorly controlled type 2 diabetes mellitus with neuropathy (HCC) 04/14/2012   Hyperlipidemia 11/23/2011   OSA (obstructive sleep apnea) 08/27/2011   Benign essential HTN 08/27/2010   Past Medical History:  Diagnosis Date   Acute cystitis 06/19/2021   AKI (acute kidney injury) (HCC) 09/13/2022   AKI (acute kidney injury) (HCC) 09/13/2022   Allergic rhinitis    Arthritis    "hips, back, legs, arms" (07/04/2014)   Asthma    hx   Automatic implantable cardioverter-defibrillator in situ    Calcifying tendinitis of shoulder    Chronic combined systolic and diastolic CHF (congestive heart failure) (HCC)  EF 40-45% by echo 12/06/2012   Chronic diastolic heart failure (HCC)     Primarily diastolic CHF: Likely due to uncontrolled HTN. Last echo (8/12) with EF 45-50%, mild to moderate LVH with some asymmetric septal hypertrophy, RV normal size and systolic function. EF 50-55% by LV-gram in 6/12.    Chronic lower back pain    secondary to DJD, obsetiy, hip problems. Followed by Dr. Ivory Broad (pain management)   Chronic systolic heart failure (HCC) 05/17/2022   Chronic use of opiate for therapeutic purpose 12/14/2016   Contact with and (suspected) exposure to covid-19 02/07/2020   Coronary artery disease    questionable. LHC 05/2011 showing normal coronaries // Followed at [facility], Dr. Shirlee Latch   Degeneration of lumbar or lumbosacral intervertebral disc    DJD (degenerative joint disease) of hip    right sided   Fatigue 04/27/2022   Frequent UTI    GERD (gastroesophageal reflux disease)    HLD (hyperlipidemia)    Hypertension    Poorly controlled. Has had HTN since age 41. Angioedema with ACEI.  24 Hr urine and renal arterial dopplers ordered . . . Never done   LBBB (left bundle branch block)    Left shoulder pain 06/30/2015   Left spastic hemiparesis (HCC) 09/23/2015   Liver disease    Lumbago 11/22/2016   Morbid obesity (HCC)    Muscle spasm 06/19/2021   Need for pneumococcal vaccine 09/09/2017   NICM (nonischemic cardiomyopathy) (HCC)    EF 45-50% in 8/12, cath 6/12 showed normal coronaries, EF 50-55% by LV gram   OSA on CPAP    sleep study in 8/12 showed moderate to severe OSA requiring CPAP   Perimenopausal 03/28/2017   Polyneuropathy in diabetes(357.2)    Presence of permanent cardiac pacemaker    Rash 07/14/2016   Seizures (HCC)    last 3 months   Shortness of breath    none now   Stroke [facility]) 12/2013   "my left side is paralyzed" (07/04/2014)   Thoracic or lumbosacral neuritis or radiculitis, unspecified    Type II diabetes mellitus (HCC) DX: 2002   Urinary frequency 11/05/2014   Vaginal discharge 02/05/2021    Family History  Problem Relation Age of Onset   Heart disease Mother 63       Died of MI at age 7 yo   Kidney disease Mother        requiring dialysis   Congestive Heart Failure Mother    Heart disease Father 62       MI age 7yo requiring stenting   Diabetes Father    Glaucoma Father    Heart disease Paternal Grandmother         requiring pacemaker.   Heart disease Paternal Grandfather 76       Died of MI at possibly age 48-53yo   Stroke Paternal Grandfather    Diabetes Brother    Heart disease Brother 53       MI at age 87 years old   Breast cancer Paternal Aunt    Breast cancer Maternal Grandmother     Past Surgical History:  Procedure Laterality Date   AMPUTATION Right 05/18/2023   Procedure: RIGHT FOTT 5TH TOE AMPUTATION;  Surgeon: [name], MD;  Location: [facility] OR;  Service: Orthopedics;  Laterality: Right;   BI-VENTRICULAR IMPLANTABLE CARDIOVERTER DEFIBRILLATOR N/A 08/22/2013   Procedure: BI-VENTRICULAR IMPLANTABLE CARDIOVERTER DEFIBRILLATOR  (CRT-D);  Surgeon: [name], MD;  Location: [facility] CATH LAB;  Service: Cardiovascular;  Laterality: N/A;   BI-VENTRICULAR IMPLANTABLE CARDIOVERTER DEFIBRILLATOR  (CRT-D)  08/2013   [name] 08/23/2013   BIV ICD GENERATOR CHANGEOUT N/A 03/18/2021   Procedure: BIV ICD GENERATOR CHANGEOUT;  Surgeon: [name], MD;  Location: [facility] INVASIVE CV LAB;  Service: Cardiovascular;  Laterality: N/A;   BREAST SURGERY Bilateral 2011   patient reports benign results   CARDIAC CATHETERIZATION  05/2011   CARPAL TUNNEL RELEASE Left    denies   HEMIARTHROPLASTY SHOULDER FRACTURE Right 1980's   denies   I & D EXTREMITY Right 03/06/2023   Procedure: IRRIGATION AND DEBRIDEMENT OF RIGHT FOOT;  Surgeon: [name], MD;  Location: [facility] OR;  Service: Orthopedics;  Laterality: Right;   I & D EXTREMITY Right 03/10/2023   Procedure: RIGHT FOOT INCISION AND DEBRIDEMENT;  Surgeon: [name], MD;  Location[facility] OR;  Service: Orthopedics;  Laterality: Right;   MULTIPLE EXTRACTIONS WITH ALVEOLOPLASTY Bilateral 05/20/2017   Procedure: MULTIPLE EXTRACTION;  Surgeon: [name], DDS;  Location: [facility] OR;  Service: Oral Surgery;  Laterality: Bilateral;   MULTIPLE TOOTH EXTRACTIONS  ~ 2011   tumors removed ; "my whole top"   SHOULDER ARTHROSCOPY Right 12/26/2015   Procedure: Right  Shoulder Arthroscopy, Debridement, and Decompression;  Surgeon: [name], MD;  Location: [facility] OR;  Service: Orthopedics;  Laterality: Right;   TEE WITHOUT CARDIOVERSION N/A 01/14/2014   Procedure: TRANSESOPHAGEAL ECHOCARDIOGRAM (TEE);  Surgeon: Thurmon Fair, MD;  Location: [facility] ENDOSCOPY;  Service: Cardiovascular;  Laterality: N/A;   TUBAL LIGATION  05/31/1985   Social History   Occupational History   Occupation: Unemployed    Comment: planning on getting disability  Tobacco Use   Smoking status: Never   Smokeless tobacco: Never  Vaping Use   Vaping status: Never Used  Substance and Sexual Activity   Alcohol use: No    Alcohol/week: 0.0 standard drinks of alcohol   Drug use: No   Sexual activity: Not on file

## 2023-06-29 NOTE — Progress Notes (Signed)
Patient ID: [name], female   DOB: [birth year], 57 y.o.   MRN: [id number]    Advanced Heart Failure Clinic Note   EP: Dr Graciela Husbands PCP: Chauncey Mann, DO HF Cardiologist: Dr Shirlee Latch   HPI: [name] is a 57 y.o. with history of morbid obesity, HTN, HLD, CAD, DM2, OSA on CPAP, CVA and chronic systolic CHF in the setting of poorly-controlled HTN. She was admitted in 05/2011 with chest pain and new LBBB and ended up getting a left heart cath, which showed no angiographic CAD. Renal artery dopplers were normal in 04/2012.  She has a Medtronic CRT-D device.   Admitted 5/14 with acute on chronic combined systolic and diastolic CHF. EF from December 2013 was down to 40 to 45%.   Admitted 1/15 with slurred speech, L arm weakness and facial droop. Initial head CT showed no acute abnormality. Repeat head CT showed subtle gray white matter differentiation in R frontal lobe and R operculum perhaps representing ischemic R sided MCA. STAT repeat CT head later on 1/29 showed further evolution of R MCA territory infarct involving R insular cortex and operculum. No hemorrhage. Neurology recommended CTA head/neck, which showed occlusion of MCA branch to the area of R MCA infarct and a normal neck CTA. TEE was done and did not reveal embolic source. Was discharged to inpatient rehab.  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  She was started on Keppra.  She went to the ER in 2/16 with syncope that was probably due to seizure, no arrhythmias with device interrogation.  Echo in 11/16 showed EF back down to 25-30%.  Echo in 2/18 showed EF back up to 45%.   s/p device generator change 4/22.    Echo 8/22, EF back down to 25% with mild LV dilation, mildly decreased RV systolic function, dilated IVC.   Follow up 8/22, NYHA III and volume overloaded. Farxiga added and lasix increased x 2 days. Unfortunately lost to follow up.  Admitted 10/23 with emphysematous cystitis 2/2 bilateral  hydroureteronephrosis and AKI. Treated with IV abx and discharged with foley catheter with plans for Urology follow up.  Echo 10/23 EF 25-30%, moderate LVH, grade I DD, RV mildly reduced  Admitted 3/24 with R foot osteomyelitis. Re-admitted 6/24 with osteomyelitis. 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  Today she returns for HF follow up. We have not seen her since 07/2021. She has LE swelling. Physically limited by left hemiparesis 2/2 CVA. She has a HH aide and her Jacobs helps with ADLs. She uses a motorized wheelchair when out of the house. She has mild dyspnea with getting in and out of her chair.  Denies palpitations, abnormal bleeding, CP, dizziness, or PND/Orthopnea. Appetite ok. No fever or chills. Weight at home 235 pounds. Has CPAP, due for another sleep study. Only taking Lasix Monday and Fridays since her toe amputation.  ECG (personally reviewed): a-sensed, v-paced; NSR QRS 126 msec  MDT device interrogation (personally reviewed): OptiVol up and thoracic impedance down, rare short AF runs, no VT, 0.4 hr/day, 98.2 VP.    Labs (5/22): K 3.6, creatinine 0.82 Labs (7/22): LDL 112 Labs (6/24): K 4.4, creatinine 0.79, hgb 10.9, A1C 9.1  Echo (12/13): EF 40-45% Echo (8/14): 20% RV normal Echo (08/23/2013): EF 40-45%, diff HK Echo (09/05/2013): EF 25-30%, diff HK  Echo (12/14): EF 30%, diff HK, RV normal Echo (2/15): EF 35-40%, LA mod dilated  Echo (4/16): EF 40-45%, moderate LVH, normal  RV size with mildly decreased systolic function.   Echo (11/16): EF 25-30%, moderate LVH Echo (2/18): EF 45%, moderate LVH, normal RV size and systolic function.  Echo (8/22): EF 25%, mild LV dilation, mildly decreased RV systolic function, dilated IVC.  Echo (10/23): EF 25-30%, moderate LVH, grade I DD, RV mildly reduced   ROS: All systems negative except as listed in HPI, PMH and Problem List.  Past Medical History:  Diagnosis Date   Acute cystitis 06/19/2021   AKI (acute kidney  injury) (HCC) 09/13/2022   AKI (acute kidney injury) (HCC) 09/13/2022   Allergic rhinitis    Arthritis    "hips, back, legs, arms" (07/04/2014)   Asthma    hx   Automatic implantable cardioverter-defibrillator in situ    Calcifying tendinitis of shoulder    Chronic combined systolic and diastolic CHF (congestive heart failure) (HCC)    EF 40-45% by echo 12/06/2012   Chronic diastolic heart failure (HCC)     Primarily diastolic CHF: Likely due to uncontrolled HTN. Last echo (8/12) with EF 45-50%, mild to moderate LVH with some asymmetric septal hypertrophy, RV normal size and systolic function. EF 50-55% by LV-gram in 6/12.    Chronic lower back pain    secondary to DJD, obsetiy, hip problems. Followed by Dr. Ivory Broad (pain management)   Chronic systolic heart failure (HCC) 05/17/2022   Chronic use of opiate for therapeutic purpose 12/14/2016   Contact with and (suspected) exposure to covid-19 02/07/2020   Coronary artery disease    questionable. LHC 05/2011 showing normal coronaries // Followed at [facility], Dr. Shirlee Latch   Degeneration of lumbar or lumbosacral intervertebral disc    DJD (degenerative joint disease) of hip    right sided   Fatigue 04/27/2022   Frequent UTI    GERD (gastroesophageal reflux disease)    HLD (hyperlipidemia)    Hypertension    Poorly controlled. Has had HTN since age 29. Angioedema with ACEI.  24 Hr urine and renal arterial dopplers ordered . . . Never done   LBBB (left bundle branch block)    Left shoulder pain 06/30/2015   Left spastic hemiparesis (HCC) 09/23/2015   Liver disease    Lumbago 11/22/2016   Morbid obesity (HCC)    Muscle spasm 06/19/2021   Need for pneumococcal vaccine 09/09/2017   NICM (nonischemic cardiomyopathy) (HCC)    EF 45-50% in 8/12, cath 6/12 showed normal coronaries, EF 50-55% by LV gram   OSA on CPAP    sleep study in 8/12 showed moderate to severe OSA requiring CPAP   Perimenopausal 03/28/2017   Polyneuropathy  in diabetes(357.2)    Presence of permanent cardiac pacemaker    Rash 07/14/2016   Seizures (HCC)    last 3 months   Shortness of breath    none now   Stroke [facility]) 12/2013   "my left side is paralyzed" (07/04/2014)   Thoracic or lumbosacral neuritis or radiculitis, unspecified    Type II diabetes mellitus (HCC) DX: 2002   Urinary frequency 11/05/2014   Vaginal discharge 02/05/2021    Current Outpatient Medications  Medication Sig Dispense Refill   Accu-Chek FastClix Lancets MISC USE TO CHECK BLOOD SUGAR TWICE DAILY 102 each 2   ACCU-CHEK GUIDE test strip USE TO CHECK BLOOD SUGAR TWICE DAILY 100 strip 2   acetaminophen (TYLENOL) 500 MG tablet Take 2 tablets (1,000 mg total) by mouth every 8 (eight) hours. 100 tablet 0   amLODipine (NORVASC) 10 MG tablet Take 1 tablet (  10 mg total) by mouth daily. 90 tablet 3   ascorbic acid (VITAMIN C) 500 MG tablet Take 2 tablets (1,000 mg total) by mouth daily. 100 tablet 0   atorvastatin (LIPITOR) 40 MG tablet Take 1 tablet (40 mg total) by mouth daily. 90 tablet 2   Blood Glucose Monitoring Suppl (ACCU-CHEK GUIDE) w/Device KIT Use As Directed 1 kit 0   busPIRone (BUSPAR) 10 MG tablet Take 1 tablet (10 mg total) by mouth 2 (two) times daily. 60 tablet 1   COMFORT EZ PEN NEEDLES 31G X 8 MM MISC USE TO INJECT INSULIN IN THE MORNING, NOON, EVENING, AND BEDTIME 100 each 2   cyanocobalamin 1000 MCG tablet Take 1 tablet by mouth daily.     cyclobenzaprine (FLEXERIL) 5 MG tablet TAKE ONE TABLET BY MOUTH THREE TIMES DAILY AS NEEDED FOR MUSCLE SPASMS 30 tablet 1   diclofenac Sodium (VOLTAREN) 1 % GEL APPLY 4 GRAMS TOPICALLY TO LEFT KNEE FOUR TIMES DAILY. (Patient taking differently: Apply 4 g topically 4 (four) times daily as needed (Pain). Left knee) 300 g 2   escitalopram (LEXAPRO) 20 MG tablet Take 1 tablet (20 mg total) by mouth daily. 30 tablet 1   fluticasone (CUTIVATE) 0.05 % cream Apply 1 Application topically 2 (two) times daily. 30 g 1   folic acid  (FOLVITE) 1 MG tablet Take 1 mg by mouth daily.     furosemide (LASIX) 40 MG tablet Take 1 tablet (40 mg total) by mouth daily. Please keep follow up as scheduled for further refills 30 tablet 0   gabapentin (NEURONTIN) 300 MG capsule Take 1 capsule (300 mg total) by mouth 2 (two) times daily. 60 capsule 3   hydrALAZINE (APRESOLINE) 25 MG tablet Take 1 tablet (25 mg total) by mouth 3 (three) times daily. 90 tablet 3   insulin aspart (NOVOLOG) cartridge Inject into the skin. Use for Freestyle libre pump     isosorbide mononitrate (IMDUR) 60 MG 24 hr tablet Take 1.5 tablets (90 mg total) by mouth daily. NEEDS FOLLOW UP FOR MORE REFILLS 90 tablet 0   ketoconazole (NIZORAL) 2 % cream apply TO affected AREA as directed TOPICALLY DAILY 15 g 0   LANTUS SOLOSTAR 100 UNIT/ML Solostar Pen INJECT 25 UNITS INTO THE SKIN EVERY DAY 15 mL 2   levETIRAcetam (KEPPRA) 500 MG tablet TAKE ONE TABLET BY MOUTH TWICE DAILY 180 tablet 2   LINZESS 290 MCG CAPS capsule TAKE ONE CAPSULE BY MOUTH EVERY DAY AS NEEDED FOR CONSTIPATION 30 capsule 1   liver oil-zinc oxide (DESITIN) 40 % ointment Apply topically every 4 (four) hours. (Patient taking differently: Apply 1 Application topically as needed for irritation.) 56.7 g 0   metoprolol succinate (TOPROL-XL) 50 MG 24 hr tablet Take 1 tablet (50 mg total) by mouth daily. 30 tablet 0   oxyCODONE (ROXICODONE) 5 MG immediate release tablet Take 1 tablet (5 mg total) by mouth every 6 (six) hours as needed. 12 tablet 0   pantoprazole (PROTONIX) 40 MG tablet TAKE ONE TABLET BY MOUTH EVERY DAY 90 tablet 2   potassium chloride SA (KLOR-CON M20) 20 MEQ tablet Take 1 tablet (20 mEq total) by mouth daily. Please keep follow up appointment as scheduled for further refills 30 tablet 0   rivaroxaban (XARELTO) 20 MG TABS tablet Take 1 tablet (20 mg total) by mouth daily with supper. 30 tablet 11   spironolactone (ALDACTONE) 100 MG tablet TAKE ONE TABLET BY MOUTH EVERY DAY 90 tablet 2   STOOL  SOFTENER/LAXATIVE 50-8.6 MG tablet TAKE ONE TABLET BY MOUTH AT BEDTIME AS NEEDED FOR MILD CONSTIPATION 30 tablet 0   talc powder Apply topically as needed. 240 g 0   traZODone (DESYREL) 100 MG tablet Take 1 tablet (100 mg total) by mouth at bedtime. 30 tablet 1   TRULICITY 0.75 MG/0.5ML SOPN INJECT 0.75 MG SUBCUTANEOUSLY ONCE WEEKLY (Patient taking differently: Inject into the skin every Wednesday.) 2 mL 2   VENTOLIN HFA 108 (90 Base) MCG/ACT inhaler INHALE TWO PUFFS INTO THE LUNGS EVERY 6 HOURS AS NEEDED FOR WHEEZING OR SHORTNESS OF BREATH (Patient taking differently: 1-2 puffs every 6 (six) hours as needed for wheezing or shortness of breath.) 18 g 2   Vitamin D, Ergocalciferol, (DRISDOL) 1.25 MG (50000 UNIT) CAPS capsule TAKE ONE CAPSULE BY MOUTH EVERY 7 DAYS 5 capsule 5   Zinc Sulfate 220 (50 Zn) MG TABS Take 1 tablet (220 mg total) by mouth daily. 100 tablet 0   Continuous Blood Gluc Receiver (FREESTYLE LIBRE 3 READER) DEVI 1 Device by Does not apply route continuous. Monitor sugars continuously (Patient not taking: Reported on 06/29/2023) 1 each 1   Continuous Blood Gluc Sensor (FREESTYLE LIBRE 3 SENSOR) MISC Place new sensor every 14 days. Monitor sugars continuously. (Patient not taking: Reported on 06/29/2023) 2 each 11   No current facility-administered medications for this encounter.   BP (!) 158/84   Pulse 87   Wt 109.6 kg (241 lb 9.6 oz)   LMP 09/13/2019 (Exact Date)   SpO2 98%   BMI 35.68 kg/m   Wt Readings from Last 3 Encounters:  06/29/23 109.6 kg (241 lb 9.6 oz)  05/23/23 107 kg (236 lb)  05/16/23 102.5 kg (226 lb)    PHYSICAL EXAM: General:  NAD. No resp difficulty, arrived in motorized wheelchair HEENT: Normal Neck: Supple. JVP 10. Carotids 2+ bilat; no bruits. No lymphadenopathy or thryomegaly appreciated. Cor: PMI nondisplaced. Regular rate & rhythm. No rubs, gallops or murmurs. Lungs: Clear Abdomen: Soft, obese, nontender, nondistended. No hepatosplenomegaly. No  bruits or masses. Good bowel sounds. Extremities: No cyanosis, clubbing, rash, 1-2 + BLE edema; R ortho shoe on Neuro: Alert & oriented x 3, cranial nerves grossly intact. + Left hemiparesis. Affect pleasant.  ASSESSMENT/PLAN: 1. Chronic systolic HF: Nonischemic cardiomyopathy, normal coronaries on 6/12 angiography.  Cardiomyopathy may be due to long-standing and poorly-controlled HTN.  Echo 11/16 EF 25-30%, echo 2/18 with EF improved to 45%.  S/P CRT-D Medtronic.  Echo 8/22 showed EF back down to 25% with mildly decreased RV systolic function.  Unfortunately lost to follow up. Echo 10/23 showed EF 25-30%, moderate LVH, grade I DD, RV mildly reduced. Today, she is volume overloaded by exam and OptiVol. NYHA class III symptoms, confounded by hemiparesis and recent toe amputation.  - Increase Lasix to 40 mg daily, increase KCL to 20 daily.  BMET/BNP today, repeat BMET in 10 days.  - Continue Toprol XL 50 mg daily.  - Continue spironolactone 100 mg daily.  - Continue Imdur 90 mg daily + hydralazine 25 mg tid. - Off Farxiga with yeast infections. Consider re-trial when A1C improved.  - Plan to add valsartan next visit (no ACEi or Entresto with angioedema) - Repeat echo after GDMT titrated and diuresed. 2. HTN: BP elevated. Diurese as above. - Can stop amlodipine in the future to allow uptitration of GDMT.  3. Morbid obesity:  Body mass index is 35.68 kg/m. 4. OSA on CPAP:  Continue CPAP. 5. Atrial fibrillation: PAF. Patient had a  CVA in the past.  CHADSVASC = 5.  - Continue Xarelto.  CBC today. 6. CVA: Prior CVA with left leg weakness.  7. 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 Lajoyce Corners 8. DM2: Recent A1C 9.1 - She is on insulin. - Consider re-trail of SGLT2i if/when A1C improves.  Follow up in 3-4 weeks with APP for GDMT titration and 3 months with Dr. Shirlee Latch + echo  Anderson Malta [facility] FNP-BC 06/29/2023

## 2023-06-30 NOTE — Telephone Encounter (Signed)
Patient called in stating her Peer Support person will be coming to clinic today to drop off paper work needed for patient to have apt at [employer].

## 2023-06-30 NOTE — Telephone Encounter (Addendum)
Pearson Grippe, CPSS with [employer] came to clinic with completed FL2 form. (Patient stated this was completed by her Case Worker, Adria Devon). States this form is being required by [facility], patient's managed MCD Provider. She is also requesting a medication list signed by a Provider. Discussed with Dr. Criselda Peaches. Will bring these to [employer].

## 2023-07-01 NOTE — Telephone Encounter (Signed)
FL2 and med list signed by Provider. Marcelino Duster notified that paper work is ready. She will p/u paper work today before noon.

## 2023-07-05 ENCOUNTER — Ambulatory Visit: Payer: PRIVATE HEALTH INSURANCE

## 2023-07-06 ENCOUNTER — Telehealth: Payer: Self-pay | Admitting: Family

## 2023-07-06 NOTE — Telephone Encounter (Signed)
[facility] called in stating there was a order for a CT on Pts foot but she cannot find the referral being it was not properly placed please advise if it is needed and resend order please advise  [phone] Nicole Cella

## 2023-07-06 NOTE — Telephone Encounter (Signed)
[name]  I see the order for the vas ultrasound in her chart. (Not a CT)  Can you help with this? Or let me know what I can do to change it?

## 2023-07-07 ENCOUNTER — Ambulatory Visit (HOSPITAL_COMMUNITY): Admission: RE | Admit: 2023-07-07 | Payer: Medicaid Other | Source: Ambulatory Visit

## 2023-07-08 ENCOUNTER — Other Ambulatory Visit: Payer: Self-pay | Admitting: Internal Medicine

## 2023-07-11 ENCOUNTER — Other Ambulatory Visit (HOSPITAL_COMMUNITY): Payer: Medicaid Other

## 2023-07-11 ENCOUNTER — Other Ambulatory Visit: Payer: Self-pay | Admitting: Internal Medicine

## 2023-07-11 ENCOUNTER — Encounter: Payer: Medicaid Other | Admitting: Student

## 2023-07-12 ENCOUNTER — Encounter: Payer: Medicaid Other | Admitting: Student

## 2023-07-12 ENCOUNTER — Encounter: Payer: Medicaid Other | Admitting: Dietician

## 2023-07-20 ENCOUNTER — Other Ambulatory Visit: Payer: Self-pay | Admitting: Internal Medicine

## 2023-07-20 DIAGNOSIS — E1142 Type 2 diabetes mellitus with diabetic polyneuropathy: Secondary | ICD-10-CM

## 2023-07-20 NOTE — Progress Notes (Incomplete)
Patient ID: [name], female   DOB: Sep 19, 1966, 57 y.o.   MRN: [id number]    Advanced Heart Failure Clinic Note   EP: [name] PCP: Caitlyn Mann, DO HF Cardiologist: [name]   HPI: [name] is a 57 y.o. with history of morbid obesity, HTN, HLD, CAD, DM2, OSA on CPAP, CVA and chronic systolic CHF in the setting of poorly-controlled HTN. She was admitted in 05/2011 with chest pain and new LBBB and ended up getting a left heart cath, which showed no angiographic CAD. Renal artery dopplers were normal in 04/2012.  She has a Medtronic CRT-D device.   Admitted 5/14 with acute on chronic combined systolic and diastolic CHF. EF from December 2013 was down to 40 to 45%.   Admitted 1/15 with slurred speech, L arm weakness and facial droop. Initial head CT showed no acute abnormality. Repeat head CT showed subtle gray white matter differentiation in R frontal lobe and R operculum perhaps representing ischemic R sided MCA. STAT repeat CT head later on 1/29 showed further evolution of R MCA territory infarct involving R insular cortex and operculum. No hemorrhage. Neurology recommended CTA head/neck, which showed occlusion of MCA branch to the area of R MCA infarct and a normal neck CTA. TEE was done and did not reveal embolic source. Was discharged to inpatient rehab.  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  She was started on Keppra.  She went to the ER in 2/16 with syncope that was probably due to seizure, no arrhythmias with device interrogation.  Echo in 11/16 showed EF back down to 25-30%.  Echo in 2/18 showed EF back up to 45%.   s/p device generator change 4/22.    Echo 8/22, EF back down to 25% with mild LV dilation, mildly decreased RV systolic function, dilated IVC.   Follow up 8/22, NYHA III and volume overloaded. Farxiga added and lasix increased x 2 days. Unfortunately lost to follow up.  Admitted 10/23 with emphysematous cystitis 2/2 bilateral  hydroureteronephrosis and AKI. Treated with IV abx and discharged with foley catheter with plans for Urology follow up.  Echo 10/23 EF 25-30%, moderate LVH, grade I DD, RV mildly reduced  Admitted 3/24 with R foot osteomyelitis. Re-admitted 6/24 with osteomyelitis. 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  Seen in AHF clinic 7/24. Volume overloaded on exam and by optivol. GDMT titrated. Physically limited by left hemiparesis 2/2 CVA.   Today she returns for AHF follow up. Overall feeling ok. Denies palpitations, CP, dizziness, edema, or PND/Orthopnea. No SOB. Appetite poor. Sleeps with 4 pillows behind her head. No fever or chills. Does not weight at home. Taking all medications but tired of taking so many medicines. Denies ETOH, smoking . Missed PCP appt this morning to evaluate urticaria and yeast infection, rescheduled. Has been taking 40 mg lasix as needed.   ECG (personally reviewed from 7/24): a-sensed, v-paced; NSR QRS 126 msec  MDT device interrogation (personally reviewed): Optivol up but downtrending, thoracic impedance below threshold, activity ~0.4 hr/day, No AT/AF. VP 98.3%  Labs (5/22): K 3.6, creatinine 0.82 Labs (7/22): LDL 112 Labs (6/24): K 4.4, creatinine 0.79, hgb 10.9, A1C 9.1  Echo (12/13): EF 40-45% Echo (8/14): 20% RV normal Echo (08/23/2013): EF 40-45%, diff HK Echo (09/05/2013): EF 25-30%, diff HK  Echo (12/14): EF 30%, diff HK, RV normal Echo (2/15): EF 35-40%, LA mod dilated  Echo (4/16): EF 40-45%, moderate LVH, normal RV  size with mildly decreased systolic function.   Echo (11/16): EF 25-30%, moderate LVH Echo (2/18): EF 45%, moderate LVH, normal RV size and systolic function.  Echo (8/22): EF 25%, mild LV dilation, mildly decreased RV systolic function, dilated IVC.  Echo (10/23): EF 25-30%, moderate LVH, grade I DD, RV mildly reduced   ROS: All systems negative except as listed in HPI, PMH and Problem List.  Past Medical History:   Diagnosis Date   Acute cystitis 06/19/2021   AKI (acute kidney injury) (HCC) 09/13/2022   AKI (acute kidney injury) (HCC) 09/13/2022   Allergic rhinitis    Arthritis    "hips, back, legs, arms" (07/04/2014)   Asthma    hx   Automatic implantable cardioverter-defibrillator in situ    Calcifying tendinitis of shoulder    Chronic combined systolic and diastolic CHF (congestive heart failure) (HCC)    EF 40-45% by echo 12/06/2012   Chronic diastolic heart failure (HCC)     Primarily diastolic CHF: Likely due to uncontrolled HTN. Last echo (8/12) with EF 45-50%, mild to moderate LVH with some asymmetric septal hypertrophy, RV normal size and systolic function. EF 50-55% by LV-gram in 6/12.    Chronic lower back pain    secondary to DJD, obsetiy, hip problems. Followed by [name] (pain management)   Chronic systolic heart failure (HCC) 05/17/2022   Chronic use of opiate for therapeutic purpose 12/14/2016   Contact with and (suspected) exposure to covid-19 02/07/2020   Coronary artery disease    questionable. LHC 05/2011 showing normal coronaries // Followed at [facility], Caitlyn. Shirlee Jacobs   Degeneration of lumbar or lumbosacral intervertebral disc    DJD (degenerative joint disease) of hip    right sided   Fatigue 04/27/2022   Frequent UTI    GERD (gastroesophageal reflux disease)    HLD (hyperlipidemia)    Hypertension    Poorly controlled. Has had HTN since age 31. Angioedema with ACEI.  24 Hr urine and renal arterial dopplers ordered . . . Never done   LBBB (left bundle branch block)    Left shoulder pain 06/30/2015   Left spastic hemiparesis (HCC) 09/23/2015   Liver disease    Lumbago 11/22/2016   Morbid obesity (HCC)    Muscle spasm 06/19/2021   Need for pneumococcal vaccine 09/09/2017   NICM (nonischemic cardiomyopathy) (HCC)    EF 45-50% in 8/12, cath 6/12 showed normal coronaries, EF 50-55% by LV gram   OSA on CPAP    sleep study in 8/12 showed moderate to  severe OSA requiring CPAP   Perimenopausal 03/28/2017   Polyneuropathy in diabetes(357.2)    Presence of permanent cardiac pacemaker    Rash 07/14/2016   Seizures (HCC)    last 3 months   Shortness of breath    none now   Stroke [facility]) 12/2013   "my left side is paralyzed" (07/04/2014)   Thoracic or lumbosacral neuritis or radiculitis, unspecified    Type II diabetes mellitus (HCC) DX: 2002   Urinary frequency 11/05/2014   Vaginal discharge 02/05/2021    Current Outpatient Medications  Medication Sig Dispense Refill   Accu-Chek FastClix Lancets MISC USE TO CHECK BLOOD SUGAR TWICE DAILY 102 each 2   ACCU-CHEK GUIDE test strip USE TO CHECK BLOOD SUGAR TWICE DAILY 100 strip 2   acetaminophen (TYLENOL) 500 MG tablet Take 2 tablets (1,000 mg total) by mouth every 8 (eight) hours. 100 tablet 0   albuterol (VENTOLIN HFA) 108 (90 Base) MCG/ACT inhaler Inhale  1-2 puffs into the lungs every 6 (six) hours as needed for wheezing or shortness of breath. 18 g 2   amLODipine (NORVASC) 10 MG tablet Take 1 tablet (10 mg total) by mouth daily. 90 tablet 3   ascorbic acid (VITAMIN C) 500 MG tablet Take 2 tablets (1,000 mg total) by mouth daily. 100 tablet 0   atorvastatin (LIPITOR) 40 MG tablet Take 1 tablet (40 mg total) by mouth daily. 90 tablet 2   Blood Glucose Monitoring Suppl (ACCU-CHEK GUIDE) w/Device KIT Use As Directed 1 kit 0   busPIRone (BUSPAR) 10 MG tablet Take 1 tablet (10 mg total) by mouth 2 (two) times daily. 60 tablet 1   COMFORT EZ PEN NEEDLES 31G X 8 MM MISC USE TO INJECT INSULIN IN THE MORNING, NOON, EVENING, AND BEDTIME 100 each 2   cyanocobalamin 1000 MCG tablet Take 1 tablet by mouth daily.     cyclobenzaprine (FLEXERIL) 5 MG tablet TAKE ONE TABLET BY MOUTH THREE TIMES DAILY AS NEEDED FOR MUSCLE SPASMS 30 tablet 1   diclofenac Sodium (VOLTAREN) 1 % GEL APPLY 4 GRAMS TOPICALLY TO LEFT KNEE FOUR TIMES DAILY. (Patient taking differently: Apply 4 g topically 4 (four) times daily as  needed (Pain). Left knee) 300 g 2   Dulaglutide (TRULICITY) 0.75 MG/0.5ML SOPN Inject 0.75 mg into the skin every Wednesday. 2 mL 2   escitalopram (LEXAPRO) 20 MG tablet Take 1 tablet (20 mg total) by mouth daily. 30 tablet 1   fluticasone (CUTIVATE) 0.05 % cream Apply 1 Application topically 2 (two) times daily. 30 g 1   folic acid (FOLVITE) 1 MG tablet Take 1 mg by mouth daily.     furosemide (LASIX) 40 MG tablet Take 1 tablet (40 mg total) by mouth daily. Please keep follow up as scheduled for further refills 30 tablet 0   gabapentin (NEURONTIN) 300 MG capsule Take 1 capsule (300 mg total) by mouth 2 (two) times daily. 60 capsule 3   hydrALAZINE (APRESOLINE) 25 MG tablet Take 1 tablet (25 mg total) by mouth 3 (three) times daily. 90 tablet 3   insulin aspart (NOVOLOG) cartridge Inject into the skin. Use for Freestyle libre pump     isosorbide mononitrate (IMDUR) 60 MG 24 hr tablet Take 1.5 tablets (90 mg total) by mouth daily. NEEDS FOLLOW UP FOR MORE REFILLS 90 tablet 0   ketoconazole (NIZORAL) 2 % cream apply TO affected AREA as directed TOPICALLY DAILY 15 g 0   LANTUS SOLOSTAR 100 UNIT/ML Solostar Pen INJECT 25 UNITS INTO THE SKIN EVERY DAY 15 mL 2   levETIRAcetam (KEPPRA) 500 MG tablet TAKE ONE TABLET BY MOUTH TWICE DAILY (Patient taking differently: Take 500 mg by mouth daily.) 180 tablet 2   LINZESS 290 MCG CAPS capsule TAKE ONE CAPSULE BY MOUTH EVERY DAY AS NEEDED FOR CONSTIPATION 30 capsule 1   liver oil-zinc oxide (DESITIN) 40 % ointment Apply topically every 4 (four) hours. (Patient taking differently: Apply 1 Application topically as needed for irritation.) 56.7 g 0   metoprolol succinate (TOPROL-XL) 50 MG 24 hr tablet Take 1 tablet (50 mg total) by mouth daily. 30 tablet 0   oxyCODONE (ROXICODONE) 5 MG immediate release tablet Take 1 tablet (5 mg total) by mouth every 6 (six) hours as needed. 12 tablet 0   pantoprazole (PROTONIX) 40 MG tablet TAKE ONE TABLET BY MOUTH EVERY DAY 90  tablet 2   potassium chloride SA (KLOR-CON M20) 20 MEQ tablet Take 1 tablet (20 mEq total)  by mouth daily. Please keep follow up appointment as scheduled for further refills 90 tablet 3   rivaroxaban (XARELTO) 20 MG TABS tablet Take 1 tablet (20 mg total) by mouth daily with supper. 30 tablet 11   spironolactone (ALDACTONE) 100 MG tablet TAKE ONE TABLET BY MOUTH EVERY DAY 90 tablet 2   STOOL SOFTENER/LAXATIVE 50-8.6 MG tablet TAKE ONE TABLET BY MOUTH AT BEDTIME AS NEEDED FOR MILD CONSTIPATION 30 tablet 0   talc powder Apply topically as needed. 240 g 0   traZODone (DESYREL) 100 MG tablet Take 1 tablet (100 mg total) by mouth at bedtime. 30 tablet 1   Vitamin D, Ergocalciferol, (DRISDOL) 1.25 MG (50000 UNIT) CAPS capsule TAKE ONE CAPSULE BY MOUTH EVERY 7 DAYS 5 capsule 5   Zinc Sulfate 220 (50 Zn) MG TABS Take 1 tablet (220 mg total) by mouth daily. 100 tablet 0   Continuous Blood Gluc Receiver (FREESTYLE LIBRE 3 READER) DEVI 1 Device by Does not apply route continuous. Monitor sugars continuously (Patient not taking: Reported on 07/27/2023) 1 each 1   Continuous Blood Gluc Sensor (FREESTYLE LIBRE 3 SENSOR) MISC Place new sensor every 14 days. Monitor sugars continuously. (Patient not taking: Reported on 07/27/2023) 2 each 11   No current facility-administered medications for this encounter.   BP (!) 192/102   Pulse 95   Wt 110 kg (242 lb 6.4 oz)   LMP 09/13/2019 (Exact Date)   SpO2 98%   BMI 35.80 kg/m   Wt Readings from Last 3 Encounters:  07/27/23 110 kg (242 lb 6.4 oz)  06/29/23 109.6 kg (241 lb 9.6 oz)  05/23/23 107 kg (236 lb)    PHYSICAL EXAM: General:  elderly appearing.  No respiratory difficulty. Arrived in [facility]  HEENT: normal Neck: supple. JVD ~9 cm. Carotids 2+ bilat; no bruits. No lymphadenopathy or thyromegaly appreciated. Cor: PMI nondisplaced. Regular rate & rhythm. No rubs, gallops or murmurs. Lungs: clear Abdomen: soft, nontender, nondistended. No hepatosplenomegaly. No  bruits or masses. Good bowel sounds. Extremities: no cyanosis, clubbing, rash, +1-2 BLE edema. Brace to LLE.  Neuro: alert & oriented x 3, cranial nerves grossly intact. moves all 4 extremities w/o difficulty. Affect pleasant.   ASSESSMENT/PLAN: 1. Chronic systolic HF: Nonischemic cardiomyopathy, normal coronaries on 6/12 angiography.  Cardiomyopathy may be due to long-standing and poorly-controlled HTN.  Echo 11/16 EF 25-30%, echo 2/18 with EF improved to 45%.  S/P CRT-D Medtronic.  Echo 8/22 showed EF back down to 25% with mildly decreased RV systolic function.  Unfortunately lost to follow up. Echo 10/23 showed EF 25-30%, moderate LVH, grade I DD, RV mildly reduced. Today, she is mildly volume overloaded by exam and OptiVol (improved from last visit). NYHA class III symptoms, confounded by hemiparesis and toe amputation.  - Restart Lasix 40 mg daily and KCL 20 daily.   - Continue Toprol XL 50 mg daily.  - Continue spironolactone 100 mg daily.  - Continue Imdur 90 mg daily + increase hydralazine 25>50 mg BID (refuses to take TID). - Off Farxiga with yeast infections. Consider re-trial when A1C improved. Recent yeast infection, will hold off today. - Plan to add valsartan next visit (no ACEi or Entresto with angioedema) - Repeat echo after GDMT titrated and diuresed. - BMET/BNP today. Will need labs at f/u.  2. HTN: BP elevated. Diurese as above. (Has not had meds this morning) - Can stop amlodipine in the future to allow uptitration of GDMT.  3. Morbid obesity:  Body mass index is  35.8 kg/m. 4. OSA on CPAP:  Needs repeat sleep study for new CPAP, will order 5. Atrial fibrillation: PAF. Patient had a CVA in the past.  CHADSVASC = 5.  - Continue Xarelto.  CBC today. 6. CVA: Prior CVA with left leg weakness.  7. PAD: Recent osteomyelitis and now s/p amputation of Right 5th toe - Follows with Caitlyn. Lajoyce Corners 8. DM2: Recent A1C 9.1 - She is on insulin. - Consider re-trail of SGLT2i if/when A1C  improves.  Several AHF meds refilled today. Discussed importance of med compliance.   F/u in 2 weeks with APP to reassess BP/volume. Keep follow up in 3 months with Caitlyn. Shirlee Jacobs + echo  Alen Bleacher AGACNP-BC  07/27/2023

## 2023-07-20 NOTE — Telephone Encounter (Signed)
Next appt scheduled 8/14 with Dr Colbert Coyer.

## 2023-07-20 NOTE — Telephone Encounter (Signed)
Pt requesting the following medication be called in to    [facility] in [place], [place]   Located in: [facility] Address: [address] [place], [place], [place] [address] Hours:  OPhone: [phone]  cyclobenzaprine (FLEXERIL) 5 MG tablet

## 2023-07-24 NOTE — Telephone Encounter (Signed)
Old rx from 10 months ago, patient has an appt on 8/14 and will discuss if she needs it at that time

## 2023-07-26 ENCOUNTER — Telehealth (HOSPITAL_COMMUNITY): Payer: Self-pay

## 2023-07-26 NOTE — Telephone Encounter (Signed)
Called to confirm/remind patient of their appointment at the [facility] on 07/27/23.   Patient reminded to bring all medications and/or complete list.  Confirmed patient has transportation. Gave directions, instructed to utilize valet parking.  Confirmed appointment prior to ending call.

## 2023-07-27 ENCOUNTER — Encounter: Payer: Medicaid Other | Admitting: Student

## 2023-07-27 ENCOUNTER — Ambulatory Visit (HOSPITAL_BASED_OUTPATIENT_CLINIC_OR_DEPARTMENT_OTHER)
Admission: RE | Admit: 2023-07-27 | Discharge: 2023-07-27 | Disposition: A | Discharge status: Home / Self Care | Payer: Medicaid Other | Source: Ambulatory Visit | Attending: Family | Admitting: Family

## 2023-07-27 ENCOUNTER — Encounter (HOSPITAL_COMMUNITY): Payer: Self-pay

## 2023-07-27 ENCOUNTER — Ambulatory Visit (HOSPITAL_COMMUNITY)
Admission: RE | Admit: 2023-07-27 | Discharge: 2023-07-27 | Disposition: A | Discharge status: Home / Self Care | Payer: Medicaid Other | Source: Ambulatory Visit | Attending: Internal Medicine | Admitting: Internal Medicine

## 2023-07-27 VITALS — BP 192/102 | HR 95 | Wt 242.4 lb

## 2023-07-27 DIAGNOSIS — E1159 Type 2 diabetes mellitus with other circulatory complications: Secondary | ICD-10-CM

## 2023-07-27 DIAGNOSIS — I10 Essential (primary) hypertension: Secondary | ICD-10-CM | POA: Diagnosis not present

## 2023-07-27 DIAGNOSIS — I69344 Monoplegia of lower limb following cerebral infarction affecting left non-dominant side: Secondary | ICD-10-CM | POA: Diagnosis not present

## 2023-07-27 DIAGNOSIS — E785 Hyperlipidemia, unspecified: Secondary | ICD-10-CM | POA: Diagnosis not present

## 2023-07-27 DIAGNOSIS — Z6835 Body mass index (BMI) 35.0-35.9, adult: Secondary | ICD-10-CM | POA: Diagnosis not present

## 2023-07-27 DIAGNOSIS — R590 Localized enlarged lymph nodes: Secondary | ICD-10-CM | POA: Diagnosis not present

## 2023-07-27 DIAGNOSIS — Z7901 Long term (current) use of anticoagulants: Secondary | ICD-10-CM | POA: Insufficient documentation

## 2023-07-27 DIAGNOSIS — Z79899 Other long term (current) drug therapy: Secondary | ICD-10-CM | POA: Insufficient documentation

## 2023-07-27 DIAGNOSIS — Z794 Long term (current) use of insulin: Secondary | ICD-10-CM | POA: Insufficient documentation

## 2023-07-27 DIAGNOSIS — M79661 Pain in right lower leg: Secondary | ICD-10-CM

## 2023-07-27 DIAGNOSIS — I739 Peripheral vascular disease, unspecified: Secondary | ICD-10-CM

## 2023-07-27 DIAGNOSIS — Z89421 Acquired absence of other right toe(s): Secondary | ICD-10-CM | POA: Diagnosis not present

## 2023-07-27 DIAGNOSIS — R630 Anorexia: Secondary | ICD-10-CM | POA: Diagnosis not present

## 2023-07-27 DIAGNOSIS — I480 Paroxysmal atrial fibrillation: Secondary | ICD-10-CM | POA: Diagnosis not present

## 2023-07-27 DIAGNOSIS — I639 Cerebral infarction, unspecified: Secondary | ICD-10-CM

## 2023-07-27 DIAGNOSIS — I110 Hypertensive heart disease with heart failure: Secondary | ICD-10-CM | POA: Diagnosis not present

## 2023-07-27 DIAGNOSIS — I5022 Chronic systolic (congestive) heart failure: Secondary | ICD-10-CM | POA: Diagnosis not present

## 2023-07-27 DIAGNOSIS — I428 Other cardiomyopathies: Secondary | ICD-10-CM | POA: Insufficient documentation

## 2023-07-27 DIAGNOSIS — E119 Type 2 diabetes mellitus without complications: Secondary | ICD-10-CM | POA: Insufficient documentation

## 2023-07-27 DIAGNOSIS — Z7985 Long-term (current) use of injectable non-insulin antidiabetic drugs: Secondary | ICD-10-CM | POA: Diagnosis not present

## 2023-07-27 DIAGNOSIS — G4733 Obstructive sleep apnea (adult) (pediatric): Secondary | ICD-10-CM | POA: Insufficient documentation

## 2023-07-27 DIAGNOSIS — I2510 Atherosclerotic heart disease of native coronary artery without angina pectoris: Secondary | ICD-10-CM | POA: Diagnosis not present

## 2023-07-27 DIAGNOSIS — I5042 Chronic combined systolic (congestive) and diastolic (congestive) heart failure: Secondary | ICD-10-CM

## 2023-07-27 LAB — CBC
HCT: 37.6 % (ref 36.0–46.0)
Hemoglobin: 11.5 g/dL — ABNORMAL LOW (ref 12.0–15.0)
MCH: 25.5 pg — ABNORMAL LOW (ref 26.0–34.0)
MCHC: 30.6 g/dL (ref 30.0–36.0)
MCV: 83.4 fL (ref 80.0–100.0)
Platelets: 237 10*3/uL (ref 150–400)
RBC: 4.51 MIL/uL (ref 3.87–5.11)
RDW: 13.2 % (ref 11.5–15.5)
WBC: 8.5 10*3/uL (ref 4.0–10.5)
nRBC: 0 % (ref 0.0–0.2)

## 2023-07-27 LAB — BASIC METABOLIC PANEL
Anion gap: 11 (ref 5–15)
BUN: <5 mg/dL — ABNORMAL LOW (ref 6–20)
CO2: 25 mmol/L (ref 22–32)
Calcium: 9.1 mg/dL (ref 8.9–10.3)
Chloride: 99 mmol/L (ref 98–111)
Creatinine, Ser: 0.8 mg/dL (ref 0.44–1.00)
GFR, Estimated: >60 mL/min (ref >60)
Glucose, Bld: 238 mg/dL — ABNORMAL HIGH (ref 70–99)
Potassium: 3.7 mmol/L (ref 3.5–5.1)
Sodium: 135 mmol/L (ref 135–145)

## 2023-07-27 LAB — BRAIN NATRIURETIC PEPTIDE: B Natriuretic Peptide: 68 pg/mL (ref 0.0–100.0)

## 2023-07-27 MED ORDER — FUROSEMIDE 40 MG PO TABS
40.0000 mg | ORAL_TABLET | Freq: Every day | ORAL | 6 refills | Status: DC
Start: 2023-07-27 — End: 2024-02-29

## 2023-07-27 MED ORDER — ISOSORBIDE MONONITRATE ER 60 MG PO TB24
90.0000 mg | ORAL_TABLET | Freq: Every day | ORAL | 6 refills | Status: DC
Start: 2023-07-27 — End: 2024-02-29

## 2023-07-27 MED ORDER — METOPROLOL SUCCINATE ER 50 MG PO TB24: 50.0000 mg | ORAL_TABLET | Freq: Every day | ORAL | 6 refills | Status: DC

## 2023-07-27 MED ORDER — POTASSIUM CHLORIDE CRYS ER 20 MEQ PO TBCR
20.0000 meq | EXTENDED_RELEASE_TABLET | Freq: Every day | ORAL | 6 refills | Status: DC
Start: 2023-07-27 — End: 2024-04-13

## 2023-07-27 MED ORDER — HYDRALAZINE HCL 25 MG PO TABS: 25.0000 mg | ORAL_TABLET | Freq: Two times a day (BID) | ORAL | 6 refills | Status: DC

## 2023-07-27 NOTE — Patient Instructions (Addendum)
Thank you for coming in today  If you had labs drawn today, any labs that are abnormal the clinic will call you No news is good news  You have been given a prescription for blood pressure cuff.    You have been order for sleep study [facility] will call you to make appointment    Medications: RESTART Lasix 40 mg 1 tablet daily  START Potassium 20 meq daily 1 tablet INCREASE Hydralazine to 50 mg 1 tablet twice daily  Follow up appointments:  Your physician recommends that you schedule a follow-up appointment in:  2 weeks August 27th 2024 at 11:00 am in clinic   Do the following things EVERYDAY: Weigh yourself in the morning before breakfast. Write it down and keep it in a log. Take your medicines as prescribed Eat low salt foods--Limit salt (sodium) to 2000 mg per day.  Stay as active as you can everyday Limit all fluids for the day to less than 2 liters   At the [facility], you and your health needs are our priority. As part of our continuing mission to provide you with exceptional heart care, we have created designated Provider Care Teams. These Care Teams include your primary Cardiologist (physician) and Advanced Practice Providers (APPs- Physician Assistants and Nurse Practitioners) who all work together to provide you with the care you need, when you need it.   You may see any of the following providers on your designated Care Team at your next follow up: Dr Arvilla Meres Dr Marca Ancona Dr. Marcos Eke, NP Robbie Lis, [place] [facility] [place], [place] Brynda Peon, NP Karle Plumber, PharmD   Please be sure to bring in all your medications bottles to every appointment.    Thank you for choosing [facility]  If you have any questions or concerns before your next appointment please send [place] a message through [place] or call our office at [phone].    TO LEAVE A  MESSAGE FOR THE NURSE SELECT OPTION 2, PLEASE LEAVE A MESSAGE INCLUDING: YOUR NAME DATE OF BIRTH CALL BACK NUMBER REASON FOR CALL**this is important as we prioritize the call backs  YOU WILL RECEIVE A CALL BACK THE SAME DAY AS LONG AS YOU CALL BEFORE 4:00 PM

## 2023-07-29 ENCOUNTER — Telehealth: Payer: Self-pay | Admitting: Orthopedic Surgery

## 2023-07-29 NOTE — Telephone Encounter (Signed)
Called patient left message to return call to schedule an non-urgent appointment with Dr. Lajoyce Corners for Ray Amputation/US per Denny Peon     [phone]

## 2023-08-09 ENCOUNTER — Encounter (HOSPITAL_COMMUNITY): Payer: Medicaid Other

## 2023-08-11 ENCOUNTER — Other Ambulatory Visit: Payer: Self-pay | Admitting: Internal Medicine

## 2023-08-11 DIAGNOSIS — L304 Erythema intertrigo: Secondary | ICD-10-CM

## 2023-08-11 NOTE — Telephone Encounter (Signed)
Next appt scheduled 8/30 with Dr Versie Starks.

## 2023-08-12 ENCOUNTER — Encounter: Payer: Medicaid Other | Admitting: Student

## 2023-08-22 ENCOUNTER — Other Ambulatory Visit: Payer: Self-pay

## 2023-08-22 DIAGNOSIS — E1142 Type 2 diabetes mellitus with diabetic polyneuropathy: Secondary | ICD-10-CM

## 2023-08-24 MED ORDER — GABAPENTIN 300 MG PO CAPS: 300.0000 mg | ORAL_CAPSULE | Freq: Two times a day (BID) | ORAL | 3 refills | Status: DC

## 2023-08-24 MED ORDER — CYCLOBENZAPRINE HCL 5 MG PO TABS
5.0000 mg | ORAL_TABLET | Freq: Three times a day (TID) | ORAL | 0 refills | Status: DC | PRN
Start: 2023-08-24 — End: 2023-08-26

## 2023-08-26 ENCOUNTER — Ambulatory Visit (HOSPITAL_COMMUNITY)
Admission: EM | Admit: 2023-08-26 | Discharge: 2023-08-26 | Disposition: A | Discharge status: Home / Self Care | Payer: Medicaid Other | Attending: Internal Medicine | Admitting: Internal Medicine

## 2023-08-26 ENCOUNTER — Other Ambulatory Visit: Payer: Self-pay | Admitting: Internal Medicine

## 2023-08-26 ENCOUNTER — Encounter (HOSPITAL_COMMUNITY): Payer: Self-pay

## 2023-08-26 DIAGNOSIS — M7918 Myalgia, other site: Secondary | ICD-10-CM

## 2023-08-26 DIAGNOSIS — S161XXA Strain of muscle, fascia and tendon at neck level, initial encounter: Secondary | ICD-10-CM

## 2023-08-26 DIAGNOSIS — E785 Hyperlipidemia, unspecified: Secondary | ICD-10-CM

## 2023-08-26 MED ORDER — CYCLOBENZAPRINE HCL 10 MG PO TABS: 10.0000 mg | ORAL_TABLET | Freq: Two times a day (BID) | ORAL | 0 refills | Status: DC | PRN
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
## 2023-08-31 ENCOUNTER — Ambulatory Visit (INDEPENDENT_AMBULATORY_CARE_PROVIDER_SITE_OTHER): Payer: Medicaid Other | Admitting: Student

## 2023-08-31 ENCOUNTER — Encounter: Payer: Self-pay | Admitting: Student

## 2023-08-31 VITALS — BP 155/88 | HR 76 | Temp 97.8°F | Ht 69.0 in

## 2023-08-31 DIAGNOSIS — F32A Depression, unspecified: Secondary | ICD-10-CM

## 2023-08-31 DIAGNOSIS — E1165 Type 2 diabetes mellitus with hyperglycemia: Secondary | ICD-10-CM | POA: Diagnosis present

## 2023-08-31 DIAGNOSIS — R531 Weakness: Secondary | ICD-10-CM

## 2023-08-31 DIAGNOSIS — M1712 Unilateral primary osteoarthritis, left knee: Secondary | ICD-10-CM

## 2023-08-31 DIAGNOSIS — I10 Essential (primary) hypertension: Secondary | ICD-10-CM

## 2023-08-31 DIAGNOSIS — Z794 Long term (current) use of insulin: Secondary | ICD-10-CM

## 2023-08-31 DIAGNOSIS — L304 Erythema intertrigo: Secondary | ICD-10-CM | POA: Diagnosis not present

## 2023-08-31 LAB — POCT GLYCOSYLATED HEMOGLOBIN (HGB A1C): Hemoglobin A1C: 9.4 % — AB (ref 4.0–5.6)

## 2023-08-31 LAB — GLUCOSE, CAPILLARY: Glucose-Capillary: 183 mg/dL — ABNORMAL HIGH (ref 70–99)

## 2023-08-31 MED ORDER — KETOCONAZOLE 2 % EX CREA
TOPICAL_CREAM | Freq: Every day | CUTANEOUS | 0 refills | Status: DC
Start: 2023-08-31 — End: 2024-03-02

## 2023-08-31 MED ORDER — NYSTATIN 100000 UNIT/GM EX POWD: 1.0000 | Freq: Three times a day (TID) | CUTANEOUS | 0 refills | Status: DC

## 2023-08-31 MED ORDER — TRULICITY 1.5 MG/0.5ML ~~LOC~~ SOAJ
1.5000 mg | SUBCUTANEOUS | 1 refills | Status: DC
Start: 2023-08-31 — End: 2023-09-28

## 2023-08-31 MED ORDER — GABAPENTIN 300 MG PO CAPS
ORAL_CAPSULE | ORAL | 1 refills | Status: DC
Start: 2023-08-31 — End: 2024-01-03

## 2023-08-31 NOTE — Assessment & Plan Note (Signed)
PHQ-9 score was 23 today.  Patient expressed having a lower mood than usual.  She is currently taking Lexapro 20 mg daily.  She has a therapist who she talks to occasionally, but may benefit from additional counseling and/or connection to resources, so have put in referral for behavioral health.

## 2023-08-31 NOTE — Assessment & Plan Note (Addendum)
Patient has history of type 2 diabetes.  A1c today is 9.4, last A1c was 9.1 in June.  She is currently taking Trulicity 0.75 weekly and Lantus 25 units nightly.  She denies any new polyuria, polydipsia, neuropathy.  She recently had an amputation of the fifth digit on her right foot.  Her foot peers to be healing appropriately.  A diabetic foot exam was done.  She did not have any foot ulcers, but did have some skin breakdown, particularly on the lateral aspects of her feet.  Sensation and pulses were intact throughout.  She previously obtained a freestyle libre, but says she does not know how to work it or how to start using it, so I will place a referral to [name] to assist her with education about it.  She has not been having any side effects with Trulicity, so we will increase her dose today.  She is not on an SGLT2 due to recurrent UTIs in the past.  Plan -increase trulicity to 1.5 mg weekly -continue Lantus 25 units at bedtime -referral to [name] for assistance with Freestyle Libre -increase Gabapentin to 300 mg with breakfast and lunch, 600 mg in the evening -faxing diabetes foot exam to [facility] for a new diabetic shoe -follow-up appointment in 4 weeks

## 2023-08-31 NOTE — Progress Notes (Signed)
CC: Diabetes follow up  HPI:  [name] is a 57 y.o. female living with a history stated below and presents today for dui. Please see problem based assessment and plan for additional details.  Past Medical History:  Diagnosis Date   Acute cystitis 06/19/2021   AKI (acute kidney injury) (HCC) 09/13/2022   AKI (acute kidney injury) (HCC) 09/13/2022   Allergic rhinitis    Arthritis    "hips, back, legs, arms" (07/04/2014)   Asthma    hx   Automatic implantable cardioverter-defibrillator in situ    Calcifying tendinitis of shoulder    Chronic combined systolic and diastolic CHF (congestive heart failure) (HCC)    EF 40-45% by echo 12/06/2012   Chronic diastolic heart failure (HCC)     Primarily diastolic CHF: Likely due to uncontrolled HTN. Last echo (8/12) with EF 45-50%, mild to moderate LVH with some asymmetric septal hypertrophy, RV normal size and systolic function. EF 50-55% by LV-gram in 6/12.    Chronic lower back pain    secondary to DJD, obsetiy, hip problems. Followed by Dr. Ivory Broad (pain management)   Chronic systolic heart failure (HCC) 05/17/2022   Chronic use of opiate for therapeutic purpose 12/14/2016   Contact with and (suspected) exposure to covid-19 02/07/2020   Coronary artery disease    questionable. LHC 05/2011 showing normal coronaries // Followed at [facility], Dr. Shirlee Latch   Degeneration of lumbar or lumbosacral intervertebral disc    Diabetic foot infection (HCC) 03/05/2023   DJD (degenerative joint disease) of hip    right sided   Fatigue 04/27/2022   Frequent UTI    GERD (gastroesophageal reflux disease)    HLD (hyperlipidemia)    Hypertension    Poorly controlled. Has had HTN since age 7. Angioedema with ACEI.  24 Hr urine and renal arterial dopplers ordered . . . Never done   LBBB (left bundle branch block)    Left shoulder pain 06/30/2015   Left spastic hemiparesis (HCC) 09/23/2015   Liver disease    Lumbago 11/22/2016    Morbid obesity (HCC)    Muscle spasm 06/19/2021   Need for pneumococcal vaccine 09/09/2017   NICM (nonischemic cardiomyopathy) (HCC)    EF 45-50% in 8/12, cath 6/12 showed normal coronaries, EF 50-55% by LV gram   OSA on CPAP    sleep study in 8/12 showed moderate to severe OSA requiring CPAP   Perimenopausal 03/28/2017   Polyneuropathy in diabetes(357.2)    Presence of permanent cardiac pacemaker    Rash 07/14/2016   Seizures (HCC)    last 3 months   Shortness of breath    none now   Stroke [facility]) 12/2013   "my left side is paralyzed" (07/04/2014)   Thoracic or lumbosacral neuritis or radiculitis, unspecified    Type II diabetes mellitus (HCC) DX: 2002   Urinary frequency 11/05/2014   Vaginal discharge 02/05/2021    Current Outpatient Medications on File Prior to Visit  Medication Sig Dispense Refill   Accu-Chek FastClix Lancets MISC USE TO CHECK BLOOD SUGAR TWICE DAILY 102 each 2   ACCU-CHEK GUIDE test strip USE TO CHECK BLOOD SUGAR TWICE DAILY 100 strip 2   acetaminophen (TYLENOL) 500 MG tablet Take 2 tablets (1,000 mg total) by mouth every 8 (eight) hours. 100 tablet 0   albuterol (VENTOLIN HFA) 108 (90 Base) MCG/ACT inhaler Inhale 1-2 puffs into the lungs every 6 (six) hours as needed for wheezing or shortness of breath. 18 g 2  amLODipine (NORVASC) 10 MG tablet Take 1 tablet (10 mg total) by mouth daily. 90 tablet 3   ascorbic acid (VITAMIN C) 500 MG tablet Take 2 tablets (1,000 mg total) by mouth daily. 100 tablet 0   atorvastatin (LIPITOR) 40 MG tablet TAKE ONE TABLET BY MOUTH EVERY DAY 30 tablet 3   Blood Glucose Monitoring Suppl (ACCU-CHEK GUIDE) w/Device KIT Use As Directed 1 kit 0   busPIRone (BUSPAR) 10 MG tablet Take 1 tablet (10 mg total) by mouth 2 (two) times daily. 60 tablet 1   COMFORT EZ PEN NEEDLES 31G X 8 MM MISC USE TO INJECT INSULIN IN THE MORNING, NOON, EVENING, AND BEDTIME 100 each 2   Continuous Blood Gluc Receiver (FREESTYLE LIBRE 3 READER) DEVI 1 Device  by Does not apply route continuous. Monitor sugars continuously (Patient not taking: Reported on 07/27/2023) 1 each 1   Continuous Blood Gluc Sensor (FREESTYLE LIBRE 3 SENSOR) MISC Place new sensor every 14 days. Monitor sugars continuously. (Patient not taking: Reported on 07/27/2023) 2 each 11   cyanocobalamin 1000 MCG tablet Take 1 tablet by mouth daily.     cyclobenzaprine (FLEXERIL) 10 MG tablet Take 1 tablet (10 mg total) by mouth 2 (two) times daily as needed for muscle spasms. 20 tablet 0   diclofenac Sodium (VOLTAREN) 1 % GEL APPLY 4 GRAMS TOPICALLY TO LEFT KNEE FOUR TIMES DAILY. (Patient taking differently: Apply 4 g topically 4 (four) times daily as needed (Pain). Left knee) 300 g 2   escitalopram (LEXAPRO) 20 MG tablet Take 1 tablet (20 mg total) by mouth daily. 30 tablet 1   fluticasone (CUTIVATE) 0.05 % cream Apply 1 Application topically 2 (two) times daily. 30 g 1   folic acid (FOLVITE) 1 MG tablet Take 1 mg by mouth daily.     furosemide (LASIX) 40 MG tablet Take 1 tablet (40 mg total) by mouth daily. 30 tablet 6   hydrALAZINE (APRESOLINE) 25 MG tablet Take 1 tablet (25 mg total) by mouth 2 (two) times daily. 60 tablet 6   insulin aspart (NOVOLOG) cartridge Inject into the skin. Use for Freestyle libre pump     isosorbide mononitrate (IMDUR) 60 MG 24 hr tablet Take 1.5 tablets (90 mg total) by mouth daily. 45 tablet 6   LANTUS SOLOSTAR 100 UNIT/ML Solostar Pen INJECT 25 UNITS INTO THE SKIN EVERY DAY 15 mL 2   levETIRAcetam (KEPPRA) 500 MG tablet TAKE ONE TABLET BY MOUTH TWICE DAILY (Patient taking differently: Take 500 mg by mouth daily.) 180 tablet 2   LINZESS 290 MCG CAPS capsule TAKE ONE CAPSULE BY MOUTH EVERY DAY AS NEEDED FOR CONSTIPATION 30 capsule 1   liver oil-zinc oxide (DESITIN) 40 % ointment Apply topically every 4 (four) hours. (Patient taking differently: Apply 1 Application topically as needed for irritation.) 56.7 g 0   metoprolol succinate (TOPROL-XL) 50 MG 24 hr tablet  Take 1 tablet (50 mg total) by mouth daily. 30 tablet 6   oxyCODONE (ROXICODONE) 5 MG immediate release tablet Take 1 tablet (5 mg total) by mouth every 6 (six) hours as needed. 12 tablet 0   pantoprazole (PROTONIX) 40 MG tablet TAKE ONE TABLET BY MOUTH EVERY DAY 90 tablet 2   potassium chloride SA (KLOR-CON M20) 20 MEQ tablet Take 1 tablet (20 mEq total) by mouth daily. 30 tablet 6   rivaroxaban (XARELTO) 20 MG TABS tablet Take 1 tablet (20 mg total) by mouth daily with supper. 30 tablet 11   spironolactone (ALDACTONE) 100  MG tablet TAKE ONE TABLET BY MOUTH EVERY DAY 90 tablet 2   STOOL SOFTENER/LAXATIVE 50-8.6 MG tablet TAKE ONE TABLET BY MOUTH AT BEDTIME AS NEEDED FOR MILD CONSTIPATION 30 tablet 0   talc powder Apply topically as needed. 240 g 0   traZODone (DESYREL) 100 MG tablet Take 1 tablet (100 mg total) by mouth at bedtime. 30 tablet 1   Vitamin D, Ergocalciferol, (DRISDOL) 1.25 MG (50000 UNIT) CAPS capsule TAKE ONE CAPSULE BY MOUTH EVERY 7 DAYS 5 capsule 5   Zinc Sulfate 220 (50 Zn) MG TABS Take 1 tablet (220 mg total) by mouth daily. 100 tablet 0   No current facility-administered medications on file prior to visit.    Family History  Problem Relation Age of Onset   Heart disease Mother 30       Died of MI at age 68 yo   Kidney disease Mother        requiring dialysis   Congestive Heart Failure Mother    Heart disease Father 79       MI age 63yo requiring stenting   Diabetes Father    Glaucoma Father    Heart disease Paternal Grandmother        requiring pacemaker.   Heart disease Paternal Grandfather 64       Died of MI at possibly age 2-53yo   Stroke Paternal Grandfather    Diabetes Brother    Heart disease Brother 19       MI at age 53 years old   Breast cancer Paternal Aunt    Breast cancer Maternal Grandmother     Social History   Socioeconomic History   Marital status: Single    Spouse name: Not on file   Number of children: 2   Years of education: 9th  grade   Highest education level: Not on file  Occupational History   Occupation: Unemployed    Comment: planning on getting disability  Tobacco Use   Smoking status: Never   Smokeless tobacco: Never  Vaping Use   Vaping status: Never Used  Substance and Sexual Activity   Alcohol use: No    Alcohol/week: 0.0 standard drinks of alcohol   Drug use: No   Sexual activity: Not on file  Other Topics Concern   Not on file  Social History Narrative   Lives in [place] with her son. Is able to read and write fluently in [place].   Social Determinants of Health   Financial Resource Strain: Medium Risk (03/23/2023)   Overall Financial Resource Strain (CARDIA)    Difficulty of Paying Living Expenses: Somewhat hard  Food Insecurity: Food Insecurity Present (03/23/2023)   Hunger Vital Sign    Worried About Running Out of Food in the Last Year: Often true    Ran Out of Food in the Last Year: Often true  Transportation Needs: Unmet Transportation Needs (03/23/2023)   PRAPARE - Administrator, Civil Service (Medical): Yes    Lack of Transportation (Non-Medical): Yes  Physical Activity: Insufficiently Active (03/23/2023)   Exercise Vital Sign    Days of Exercise per Week: 3 days    Minutes of Exercise per Session: 20 min  Stress: Not on file  Social Connections: Moderately Isolated (03/23/2023)   Social Connection and Isolation Panel [NHANES]    Frequency of Communication with Friends and Family: More than three times a week    Frequency of Social Gatherings with Friends and Family: Three times a week  Attends Religious Services: More than 4 times per year    Active Member of Clubs or Organizations: No    Attends Banker Meetings: Never    Marital Status: Never married  Intimate Partner Violence: Not At Risk (03/23/2023)   Humiliation, Afraid, Rape, and Kick questionnaire    Fear of Current or Ex-Partner: No    Emotionally Abused: No    Physically Abused: No     Sexually Abused: No    Review of Systems: ROS negative except for what is noted on the assessment and plan.  Vitals:   08/31/23 1404  BP: (!) 155/88  Pulse: 76  Temp: 97.8 F (36.6 C)  TempSrc: Oral  SpO2: 99%  Height: 5\' 9"  (1.753 m)   Physical Exam: Constitutional: female sitting in wheel chair, diabetic shoes on, in NAD Cardiovascular: regular rate and rhythm, no m/r/g Pulmonary/Chest: normal work of breathing on room air, lungs clear to auscultation bilaterally MSK: ROM limited on left side by weakness secondary to stroke Neurological: alert & oriented x 3; severe left sided weakness in upper and lower extremity due to stroke; unable to lift left arm to gravity, but can lift left leg against gravity Skin: confluent lesions consistent with intertrigo in skin folds of left axilla and abdomen Psych: low mood Foot exam: some skin erosions on lateral aspects of feet; no ulcers; sensation and pulses present bilaterally; ROM on left limited due to stroke; 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  Assessment & Plan:   Patient seen with Dr. Heide Spark  Type 2 diabetes mellitus with hyperglycemia, with long-term current use of insulin [facility]) Patient has history of type 2 diabetes.  A1c today is 9.4, last A1c was 9.1 in June.  She is currently taking Trulicity 0.75 weekly and Lantus 25 units nightly.  She denies any new polyuria, polydipsia, neuropathy.  She recently had an amputation of the fifth digit on her right foot.  Her foot peers to be healing appropriately.  A diabetic foot exam was done.  She did not have any foot ulcers, but did have some skin breakdown, particularly on the lateral aspects of her feet.  Sensation and pulses were intact throughout.  She previously obtained a freestyle libre, but says she does not know how to work it or how to start using it, so I will place a referral to [name] to assist her with education about it.  She has not been having any side effects with  Trulicity, so we will increase her dose today.  She is not on an SGLT2 due to recurrent UTIs in the past.  Plan -increase trulicity to 1.5 mg weekly -continue Lantus 25 units at bedtime -referral to [name] for assistance with Freestyle Libre -increase Gabapentin to 300 mg with breakfast and lunch, 600 mg in the evening -faxing diabetes foot exam to [facility] for a new diabetic shoe -follow-up appointment in 4 weeks  Benign essential HTN Blood pressure today is 155/88.  She has not been taking it at home but said she just got a blood pressure cuff, so she was going to start taking it.  I encouraged her to take it and record it and bring it with her to her next visit.  Her current medication regimen is amlodipine 10 mg daily, hydralazine 25 mg 3 times daily, isosorbide mononitrate 90 mg daily, metoprolol succinate 50 mg daily, and spironolactone 100 mg daily.  She says she did not take her medications today. She is asymptomatic.  Plan -Since she has not taken her blood pressure medications today, we will hold off on making any changes for now -Encouraged her to log her blood pressure and bring it to her next visit  Depression PHQ-9 score was 23 today.  Patient expressed having a lower mood than usual.  She is currently taking Lexapro 20 mg daily.  She has a therapist who she talks to occasionally, but may benefit from additional counseling and/or connection to resources, so have put in referral for behavioral health.  Intertrigo Patient continues to have intertrigo, predominantly in her left axilla and under her abdominal skin flaps.  She has been using ketoconazole cream, but says it has not resolved the problem.  Exam is consistent with fungal infection in these areas.  Plan -Will try Nystatin powder -Refilled ketoconazole cream   Annett Fabian, MD  [facility] Internal Medicine, PGY-1 Phone: [phone] Date 08/31/2023 Time 5:07 PM

## 2023-08-31 NOTE — Assessment & Plan Note (Addendum)
Patient continues to have intertrigo, predominantly in her left axilla and under her abdominal skin flaps.  She has been using ketoconazole cream, but says it has not resolved the problem.  Exam is consistent with fungal infection in these areas.  Plan -Will try Nystatin powder -Refilled ketoconazole cream

## 2023-08-31 NOTE — Patient Instructions (Signed)
Thank you, Ms.[name] for allowing [place] to provide your care today. Today we discussed your diabetes, high blood pressure, fungal rash, and low mood.  For diabetes, we collected your hemoglobin A1c which was 9.7 today, slightly worse than previously.  As we discussed, we will go up on your Trulicity to 1.5 mg weekly.  Will continue your Lantus 25 units nightly.  I have placed a referral to [name], our diabetes educator, to work with you to set up your freestyle [place].  Will follow-up in 4 weeks on this.  For your high blood pressure, we checked it today and it was elevated at 155/88, but you indicated that you had not taken your medications today.  Please continue to make take your blood pressure medications regularly including amlodipine 10 mg daily, hydralazine 25 mg 3 times daily, isosorbide mononitrate 90 mg daily, metoprolol succinate 50 mg daily, spironolactone 100 mg daily.  As we discussed, it is important for you to take your blood pressure at home and the log that we have given you and recorded and bring it to your next visit, so we have an accurate assessment of your blood pressure can make adjustments to medications as appropriate.  We did a diabetic foot exam today.  We can fax that to the [facility] and hopefully they can work with you to get you a new diabetic shoe.  For your decreased mood, I have placed a referral to be on-call, our behavioral health specialist, who should be contacting you shortly.  For your pain, we will increase your dose of gabapentin from 300 mg twice a day, to 300 mg with breakfast and lunch and 600 mg (2 pills) at night.  For your weakness, I have also placed a referral to physical therapy and they should be contacting you shortly.  I have ordered the following labs for you:   Lab Orders         Glucose, capillary         POC Hbg A1C      Referrals ordered today:    Referral Orders         Ambulatory referral to Physical Therapy          Ambulatory referral to Integrated Behavioral Health         Ambulatory referral to Nutrition and Diabetic Education      I have ordered the following medication/changed the following medications:   Start the following medications: Meds ordered this encounter  Medications   Dulaglutide (TRULICITY) 1.5 MG/0.5ML SOPN    Sig: Inject 1.5 mg into the skin once a week.    Dispense:  2 mL    Refill:  1   nystatin (MYCOSTATIN/NYSTOP) powder    Sig: Apply 1 Application topically 3 (three) times daily.    Dispense:  15 g    Refill:  0   ketoconazole (NIZORAL) 2 % cream    Sig: Apply topically daily.    Dispense:  15 g    Refill:  0   gabapentin (NEURONTIN) 300 MG capsule    Sig: Take 1 capsule (300 mg total) by mouth 2 (two) times daily with breakfast and lunch AND 2 capsules (600 mg total) at bedtime.    Dispense:  120 capsule    Refill:  1     Follow up: 1 month  We look forward to seeing you next time. Please call our clinic at [phone] if you have any questions or concerns. The best time to  call is Monday-Friday from 9am-4pm, but there is someone available 24/7. If after hours or the weekend, call the main hospital number and ask for the Internal Medicine Resident On-Call. If you need medication refills, please notify your pharmacy one week in advance and they will send [place] a request.   Thank you for trusting me with your care. Wishing you the best!   Annett Fabian, MD [facility]

## 2023-08-31 NOTE — Assessment & Plan Note (Addendum)
Blood pressure today is 155/88.  She has not been taking it at home but said she just got a blood pressure cuff, so she was going to start taking it.  I encouraged her to take it and record it and bring it with her to her next visit.  Her current medication regimen is amlodipine 10 mg daily, hydralazine 25 mg 3 times daily, isosorbide mononitrate 90 mg daily, metoprolol succinate 50 mg daily, and spironolactone 100 mg daily.  She says she did not take her medications today. She is asymptomatic.  Plan -Since she has not taken her blood pressure medications today, we will hold off on making any changes for now -Encouraged her to log her blood pressure and bring it to her next visit

## 2023-09-01 ENCOUNTER — Ambulatory Visit (HOSPITAL_BASED_OUTPATIENT_CLINIC_OR_DEPARTMENT_OTHER): Payer: Medicaid Other | Attending: Internal Medicine | Admitting: Cardiovascular Disease

## 2023-09-01 ENCOUNTER — Telehealth: Payer: Self-pay | Admitting: Orthopedic Surgery

## 2023-09-06 NOTE — Progress Notes (Signed)
Internal Medicine Clinic Attending  I was physically present during the key portions of the resident provided service and participated in the medical decision making of patient's management care. I reviewed pertinent patient test results.  The assessment, diagnosis, and plan were formulated together and I agree with the documentation in the resident's note.  Narendra, Nischal, MD  

## 2023-09-12 ENCOUNTER — Telehealth: Payer: Self-pay

## 2023-09-12 ENCOUNTER — Ambulatory Visit: Payer: Medicaid Other | Attending: Internal Medicine | Admitting: Physical Therapy

## 2023-09-12 ENCOUNTER — Other Ambulatory Visit: Payer: Self-pay | Admitting: Internal Medicine

## 2023-09-12 DIAGNOSIS — E1140 Type 2 diabetes mellitus with diabetic neuropathy, unspecified: Secondary | ICD-10-CM

## 2023-09-12 MED ORDER — FREESTYLE LIBRE 3 SENSOR MISC
11 refills | Status: DC
Start: 2023-09-12 — End: 2024-04-13

## 2023-09-12 NOTE — Progress Notes (Signed)
Sent in new order for [employer] sensor (needs prior auth re-submitted - was unable to be run since old rx was sent by a resident who has graduated)

## 2023-09-12 NOTE — Telephone Encounter (Signed)
Thank you :)

## 2023-09-12 NOTE — Telephone Encounter (Signed)
Hey camille I have a PA  for pt [name]  sensor  but I is not letting me continue on [place] tracks because Dr Austin Miles is not here  no more he is the one who wrote the RX .[name] Can you assist please and thank you

## 2023-09-13 ENCOUNTER — Other Ambulatory Visit (HOSPITAL_COMMUNITY): Payer: Self-pay

## 2023-09-13 ENCOUNTER — Ambulatory Visit (INDEPENDENT_AMBULATORY_CARE_PROVIDER_SITE_OTHER): Payer: Medicaid Other | Admitting: Licensed Clinical Social Worker

## 2023-09-13 ENCOUNTER — Other Ambulatory Visit: Payer: Self-pay | Admitting: Internal Medicine

## 2023-09-13 DIAGNOSIS — F32A Depression, unspecified: Secondary | ICD-10-CM

## 2023-09-13 NOTE — BH Specialist Note (Signed)
Integrated Behavioral Health via Telemedicine Visit  09/13/2023 LAWONNA MINOTT [id number]  Number of Integrated Behavioral Health Clinician visits: 1- Initial Visit  Session Start time: 1530   Session End time: 1535  Total time in minutes: 5     I connected with [name]  via  Telephone and verified that I am speaking with the correct person using two identifiers. Discussed confidentiality: Yes   I discussed the limitations of telemedicine and the availability of in person appointments.  Discussed there is a possibility of technology failure and discussed alternative modes of communication if that failure occurs.  I discussed that engaging in this telemedicine visit, they consent to the provision of behavioral healthcare and the services will be billed under their insurance.  Patient and/or legal guardian expressed understanding and consented to Telemedicine visit: Yes   Presenting Concerns: Patient and/or family reports the following symptoms/concerns: Patient requested to reschedule appointment. Patient will contact office to reschedule.   Christen Butter, MSW, LCSW-A She/Her Behavioral Health Clinician [facility]  Internal Medicine Center Direct Dial:[phone]  Fax [phone] Main Office Phone: [phone] [address] [place] [place], [place] [address] Website: [facility] Internal Medicine [facility]  [place], [place]

## 2023-09-13 NOTE — Telephone Encounter (Signed)
Next appt scheduled 10/16 with PCP.  

## 2023-09-15 ENCOUNTER — Other Ambulatory Visit (HOSPITAL_COMMUNITY): Payer: Self-pay

## 2023-09-15 ENCOUNTER — Telehealth: Payer: Self-pay

## 2023-09-15 NOTE — Telephone Encounter (Signed)
Pharmacy Patient Advocate Encounter   Received notification from Physician's Office that prior authorization for [facility] 3 Sensor is required/requested.  PA required; PA submitted to [place] MEDICAID via Fax Key/confirmation #/[id number] - Status is pending

## 2023-09-19 ENCOUNTER — Ambulatory Visit: Payer: Medicaid Other | Admitting: Orthopedic Surgery

## 2023-09-22 ENCOUNTER — Other Ambulatory Visit (HOSPITAL_COMMUNITY): Payer: Self-pay

## 2023-09-22 NOTE — Telephone Encounter (Signed)
PA APPROVED  Unsure of expiration date.

## 2023-09-27 ENCOUNTER — Other Ambulatory Visit: Payer: Self-pay | Admitting: Internal Medicine

## 2023-09-27 NOTE — Telephone Encounter (Signed)
Next appt scheduled tomorrow with Dr Ned Card.

## 2023-09-28 ENCOUNTER — Encounter: Payer: Self-pay | Admitting: Internal Medicine

## 2023-09-28 ENCOUNTER — Other Ambulatory Visit: Payer: Self-pay | Admitting: Internal Medicine

## 2023-09-28 ENCOUNTER — Other Ambulatory Visit: Payer: Self-pay

## 2023-09-28 ENCOUNTER — Ambulatory Visit: Payer: Medicaid Other | Admitting: Internal Medicine

## 2023-09-28 VITALS — BP 160/88 | HR 79 | Temp 97.8°F | Ht 69.0 in | Wt 257.8 lb

## 2023-09-28 DIAGNOSIS — M869 Osteomyelitis, unspecified: Secondary | ICD-10-CM

## 2023-09-28 DIAGNOSIS — I10 Essential (primary) hypertension: Secondary | ICD-10-CM

## 2023-09-28 DIAGNOSIS — Z794 Long term (current) use of insulin: Secondary | ICD-10-CM

## 2023-09-28 DIAGNOSIS — E1165 Type 2 diabetes mellitus with hyperglycemia: Secondary | ICD-10-CM

## 2023-09-28 DIAGNOSIS — Z1211 Encounter for screening for malignant neoplasm of colon: Secondary | ICD-10-CM

## 2023-09-28 DIAGNOSIS — Z23 Encounter for immunization: Secondary | ICD-10-CM

## 2023-09-28 DIAGNOSIS — Z1231 Encounter for screening mammogram for malignant neoplasm of breast: Secondary | ICD-10-CM

## 2023-09-28 DIAGNOSIS — Z7985 Long-term (current) use of injectable non-insulin antidiabetic drugs: Secondary | ICD-10-CM

## 2023-09-28 DIAGNOSIS — M86171 Other acute osteomyelitis, right ankle and foot: Secondary | ICD-10-CM

## 2023-09-28 DIAGNOSIS — Z0000 Encounter for general adult medical examination without abnormal findings: Secondary | ICD-10-CM

## 2023-09-28 MED ORDER — TRULICITY 3 MG/0.5ML ~~LOC~~ SOAJ: 3.0000 mg | SUBCUTANEOUS | 1 refills | Status: DC

## 2023-09-28 NOTE — Progress Notes (Signed)
CC: diabetes  HPI:  [name] is a 57 y.o. female living with a history stated below and presents today for a follow up of her diabetes. Please see problem based assessment and plan for additional details.  Past Medical History:  Diagnosis Date   Acute cystitis 06/19/2021   AKI (acute kidney injury) (HCC) 09/13/2022   AKI (acute kidney injury) (HCC) 09/13/2022   Allergic rhinitis    Arthritis    "hips, back, legs, arms" (07/04/2014)   Asthma    hx   Automatic implantable cardioverter-defibrillator in situ    Calcifying tendinitis of shoulder    Chronic combined systolic and diastolic CHF (congestive heart failure) (HCC)    EF 40-45% by echo 12/06/2012   Chronic diastolic heart failure (HCC)     Primarily diastolic CHF: Likely due to uncontrolled HTN. Last echo (8/12) with EF 45-50%, mild to moderate LVH with some asymmetric septal hypertrophy, RV normal size and systolic function. EF 50-55% by LV-gram in 6/12.    Chronic lower back pain    secondary to DJD, obsetiy, hip problems. Followed by Dr. Ivory Broad (pain management)   Chronic systolic heart failure (HCC) 05/17/2022   Chronic use of opiate for therapeutic purpose 12/14/2016   Contact with and (suspected) exposure to covid-19 02/07/2020   Coronary artery disease    questionable. LHC 05/2011 showing normal coronaries // Followed at [facility], Dr. Shirlee Latch   Degeneration of lumbar or lumbosacral intervertebral disc    Diabetic foot infection (HCC) 03/05/2023   DJD (degenerative joint disease) of hip    right sided   Fatigue 04/27/2022   Frequent UTI    GERD (gastroesophageal reflux disease)    HLD (hyperlipidemia)    Hypertension    Poorly controlled. Has had HTN since age 84. Angioedema with ACEI.  24 Hr urine and renal arterial dopplers ordered . . . Never done   LBBB (left bundle branch block)    Left shoulder pain 06/30/2015   Left spastic hemiparesis (HCC) 09/23/2015   Liver disease    Lumbago  11/22/2016   Morbid obesity (HCC)    Muscle spasm 06/19/2021   Need for pneumococcal vaccine 09/09/2017   NICM (nonischemic cardiomyopathy) (HCC)    EF 45-50% in 8/12, cath 6/12 showed normal coronaries, EF 50-55% by LV gram   OSA on CPAP    sleep study in 8/12 showed moderate to severe OSA requiring CPAP   Perimenopausal 03/28/2017   Polyneuropathy in diabetes(357.2)    Presence of permanent cardiac pacemaker    Rash 07/14/2016   Seizures (HCC)    last 3 months   Shortness of breath    none now   Stroke [facility]) 12/2013   "my left side is paralyzed" (07/04/2014)   Tachycardia 07/04/2014   Thoracic or lumbosacral neuritis or radiculitis, unspecified    Type II diabetes mellitus (HCC) DX: 2002   Urinary frequency 11/05/2014   Vaginal discharge 02/05/2021    Current Outpatient Medications on File Prior to Visit  Medication Sig Dispense Refill   Accu-Chek FastClix Lancets MISC USE TO CHECK BLOOD SUGAR TWICE DAILY 102 each 2   ACCU-CHEK GUIDE test strip USE TO CHECK BLOOD SUGAR TWICE DAILY 100 strip 2   acetaminophen (TYLENOL) 500 MG tablet Take 2 tablets (1,000 mg total) by mouth every 8 (eight) hours. 100 tablet 0   albuterol (VENTOLIN HFA) 108 (90 Base) MCG/ACT inhaler Inhale 1-2 puffs into the lungs every 6 (six) hours as needed for wheezing or  shortness of breath. 18 g 2   amLODipine (NORVASC) 10 MG tablet Take 1 tablet (10 mg total) by mouth daily. 90 tablet 3   ascorbic acid (VITAMIN C) 500 MG tablet Take 2 tablets (1,000 mg total) by mouth daily. 100 tablet 0   atorvastatin (LIPITOR) 40 MG tablet TAKE ONE TABLET BY MOUTH EVERY DAY 30 tablet 3   Blood Glucose Monitoring Suppl (ACCU-CHEK GUIDE) w/Device KIT Use As Directed 1 kit 0   busPIRone (BUSPAR) 10 MG tablet Take 1 tablet (10 mg total) by mouth 2 (two) times daily. 60 tablet 1   COMFORT EZ PEN NEEDLES 31G X 8 MM MISC USE TO INJECT INSULIN IN THE MORNING, NOON, EVENING, AND BEDTIME 100 each 2   Continuous Blood Gluc Receiver  (FREESTYLE LIBRE 3 READER) DEVI 1 Device by Does not apply route continuous. Monitor sugars continuously (Patient not taking: Reported on 07/27/2023) 1 each 1   Continuous Glucose Sensor (FREESTYLE LIBRE 3 SENSOR) MISC Place new sensor every 14 days. Monitor sugars continuously. 2 each 11   cyanocobalamin 1000 MCG tablet Take 1 tablet by mouth daily.     cyclobenzaprine (FLEXERIL) 10 MG tablet Take 1 tablet (10 mg total) by mouth 2 (two) times daily as needed for muscle spasms. 20 tablet 0   diclofenac Sodium (VOLTAREN) 1 % GEL APPLY 4 GRAMS TOPICALLY TO LEFT KNEE FOUR TIMES DAILY. (Patient taking differently: Apply 4 g topically 4 (four) times daily as needed (Pain). Left knee) 300 g 2   escitalopram (LEXAPRO) 20 MG tablet Take 1 tablet (20 mg total) by mouth daily. 30 tablet 1   fluticasone (CUTIVATE) 0.05 % cream Apply 1 Application topically 2 (two) times daily. 30 g 1   folic acid (FOLVITE) 1 MG tablet Take 1 mg by mouth daily.     furosemide (LASIX) 40 MG tablet Take 1 tablet (40 mg total) by mouth daily. 30 tablet 6   gabapentin (NEURONTIN) 300 MG capsule Take 1 capsule (300 mg total) by mouth 2 (two) times daily with breakfast and lunch AND 2 capsules (600 mg total) at bedtime. 120 capsule 1   hydrALAZINE (APRESOLINE) 25 MG tablet Take 1 tablet (25 mg total) by mouth 2 (two) times daily. 60 tablet 6   insulin aspart (NOVOLOG) cartridge Inject into the skin. Use for Freestyle libre pump     isosorbide mononitrate (IMDUR) 60 MG 24 hr tablet Take 1.5 tablets (90 mg total) by mouth daily. 45 tablet 6   ketoconazole (NIZORAL) 2 % cream Apply topically daily. 15 g 0   LANTUS SOLOSTAR 100 UNIT/ML Solostar Pen INJECT 25 UNITS INTO THE SKIN EVERY DAY 15 mL 2   levETIRAcetam (KEPPRA) 500 MG tablet TAKE ONE TABLET BY MOUTH TWICE DAILY (Patient taking differently: Take 500 mg by mouth daily.) 180 tablet 2   linaclotide (LINZESS) 290 MCG CAPS capsule TAKE ONE CAPSULE BY MOUTH EVERY DAY AS NEEDED FOR  CONSTIPATION 30 capsule 3   liver oil-zinc oxide (DESITIN) 40 % ointment Apply topically every 4 (four) hours. (Patient taking differently: Apply 1 Application topically as needed for irritation.) 56.7 g 0   metoprolol succinate (TOPROL-XL) 50 MG 24 hr tablet Take 1 tablet (50 mg total) by mouth daily. 30 tablet 6   nystatin (MYCOSTATIN/NYSTOP) powder Apply 1 Application topically 3 (three) times daily. 15 g 0   oxyCODONE (ROXICODONE) 5 MG immediate release tablet Take 1 tablet (5 mg total) by mouth every 6 (six) hours as needed. 12 tablet 0  pantoprazole (PROTONIX) 40 MG tablet TAKE ONE TABLET BY MOUTH EVERY DAY 90 tablet 2   potassium chloride SA (KLOR-CON M20) 20 MEQ tablet Take 1 tablet (20 mEq total) by mouth daily. 30 tablet 6   rivaroxaban (XARELTO) 20 MG TABS tablet Take 1 tablet (20 mg total) by mouth daily with supper. 30 tablet 11   spironolactone (ALDACTONE) 100 MG tablet TAKE ONE TABLET BY MOUTH EVERY DAY 90 tablet 2   STOOL SOFTENER/LAXATIVE 50-8.6 MG tablet TAKE ONE TABLET BY MOUTH AT BEDTIME AS NEEDED FOR MILD CONSTIPATION 30 tablet 0   talc powder Apply topically as needed. 240 g 0   traZODone (DESYREL) 100 MG tablet Take 1 tablet (100 mg total) by mouth at bedtime. 30 tablet 1   Vitamin D, Ergocalciferol, (DRISDOL) 1.25 MG (50000 UNIT) CAPS capsule TAKE ONE CAPSULE BY MOUTH EVERY 7 DAYS 5 capsule 5   Zinc Sulfate 220 (50 Zn) MG TABS Take 1 tablet (220 mg total) by mouth daily. 100 tablet 0   No current facility-administered medications on file prior to visit.    Family History  Problem Relation Age of Onset   Heart disease Mother 54       Died of MI at age 71 yo   Kidney disease Mother        requiring dialysis   Congestive Heart Failure Mother    Heart disease Father 7       MI age 65yo requiring stenting   Diabetes Father    Glaucoma Father    Heart disease Paternal Grandmother        requiring pacemaker.   Heart disease Paternal Grandfather 51       Died of MI  at possibly age 34-53yo   Stroke Paternal Grandfather    Diabetes Brother    Heart disease Brother 32       MI at age 57 years old   Breast cancer Paternal Aunt    Breast cancer Maternal Grandmother     Social History   Socioeconomic History   Marital status: Single    Spouse name: Not on file   Number of children: 2   Years of education: 9th grade   Highest education level: Not on file  Occupational History   Occupation: Unemployed    Comment: planning on getting disability  Tobacco Use   Smoking status: Never   Smokeless tobacco: Never  Vaping Use   Vaping status: Never Used  Substance and Sexual Activity   Alcohol use: No    Alcohol/week: 0.0 standard drinks of alcohol   Drug use: No   Sexual activity: Not on file  Other Topics Concern   Not on file  Social History Narrative   Lives in [place] with her son. Is able to read and write fluently in [place].   Social Determinants of Health   Financial Resource Strain: Medium Risk (03/23/2023)   Overall Financial Resource Strain (CARDIA)    Difficulty of Paying Living Expenses: Somewhat hard  Food Insecurity: Food Insecurity Present (03/23/2023)   Hunger Vital Sign    Worried About Running Out of Food in the Last Year: Often true    Ran Out of Food in the Last Year: Often true  Transportation Needs: Unmet Transportation Needs (03/23/2023)   PRAPARE - Administrator, Civil Service (Medical): Yes    Lack of Transportation (Non-Medical): Yes  Physical Activity: Insufficiently Active (03/23/2023)   Exercise Vital Sign    Days of Exercise per Week:  3 days    Minutes of Exercise per Session: 20 min  Stress: Not on file  Social Connections: Moderately Isolated (03/23/2023)   Social Connection and Isolation Panel [NHANES]    Frequency of Communication with Friends and Family: More than three times a week    Frequency of Social Gatherings with Friends and Family: Three times a week    Attends Religious Services:  More than 4 times per year    Active Member of Clubs or Organizations: No    Attends Banker Meetings: Never    Marital Status: Never married  Intimate Partner Violence: Not At Risk (03/23/2023)   Humiliation, Afraid, Rape, and Kick questionnaire    Fear of Current or Ex-Partner: No    Emotionally Abused: No    Physically Abused: No    Sexually Abused: No    Review of Systems: ROS negative except for what is noted on the assessment and plan.  Vitals:   09/28/23 1337 09/28/23 1347  BP: (!) 154/84 (!) 160/88  Pulse: 84 79  Temp: 97.8 F (36.6 C)   TempSrc: Oral   SpO2: 97%   Weight: 257 lb 12.8 oz (116.9 kg)   Height: 5\' 9"  (1.753 m)     Physical Exam: Constitutional: middle aged female sitting in wheelchair, in no acute distress Cardiovascular: regular rate and rhythm, no m/r/g Pulmonary/Chest: normal work of breathing on room air MSK: normal bulk and tone, right foot in post-op boot and  Neurological: alert & oriented x 3, no focal deficit Skin: warm and dry Psych: normal mood and behavior  Assessment & Plan:    Patient discussed with Dr. Heide Spark  Type 2 diabetes mellitus with hyperglycemia, with long-term current use of insulin (HCC) A1c one month ago was 9.4%. She is on lantus 25u at bedtime and her Trulicity was increased to 1.5 mg/week. She continues to have neuropathy, but denies any new polyuria or polydipsia. She takes gabapentin twice a day for her neuropathy and states that it is worse at night, so I advised her she can take two tablets of the 300 mg gabapentin at night for her symptoms.  As far as her glucose levels, she has not been checking her levels at home. She has a freestyle libre but needs assistance placing it, thus I advised her to make an appt with [name] ASAP to get help with this.   Plan: - Referral to ophtho today  - Continue lantus 25u daily - Increase Trulicity to 3 mg/week - Follow up in ~ 2 months for A1c recheck  Benign  essential HTN BP remains uncontrolled.  BP Readings from Last 3 Encounters:  09/28/23 (!) 160/88  08/31/23 (!) 155/88  08/26/23 136/89   The patient is on amlodipine 10 mg daily, imdur 90 mg daily, spironolactone 100 mg daily, metoprolol 50 mg daily, and hydralazine 25 mg. She was supposed to take hydralazine three times a day, but she has only been taking it once a day.  I counseled the patient on taking her hydralazine 3 times a day to get better control of her blood pressure.  Will not make any further adjustments to her medications at this time, until we are able to see how her blood pressure is with this change to her hydralazine.   Osteomyelitis of fifth toe of right foot (HCC) The patient underwent right fifth ray amputation in June for osteomyelitis of her right fifth toe.  She was advised to get home health physical therapy at this  time, but this fell through and never happened.  The patient is hoping to get established with home health physical therapy now, to increase her ability to ambulate safely after this amputation.  I think she would greatly benefit from physical therapy to help her safely adapt to ambulating on her own and so she can better perform her own ADLs.   Health care maintenance - Referral for screening colonoscopy placed -Order for screening mammogram -Flu shot given today  Lilac Hoff, D.O. [facility], PGY-3 Phone: [phone] Date 09/28/2023 Time 6:09 PM

## 2023-09-28 NOTE — Assessment & Plan Note (Addendum)
BP remains uncontrolled.  BP Readings from Last 3 Encounters:  09/28/23 (!) 160/88  08/31/23 (!) 155/88  08/26/23 136/89   The patient is on amlodipine 10 mg daily, imdur 90 mg daily, spironolactone 100 mg daily, metoprolol 50 mg daily, and hydralazine 25 mg. She was supposed to take hydralazine three times a day, but she has only been taking it once a day.  I counseled the patient on taking her hydralazine 3 times a day to get better control of her blood pressure.  Will not make any further adjustments to her medications at this time, until we are able to see how her blood pressure is with this change to her hydralazine.

## 2023-09-28 NOTE — Patient Instructions (Addendum)
Thank you, Ms.[name] for allowing [place] to provide your care today. Today we discussed:  Diabetes Increase your trulicity to 3 mg/week now! Continue to take lantus 25u daily Make an appointment with [name] ASAP to get help with your Dexcom Follow up around 11/18 for your A1c recheck High blood pressure Make sure you take hydralazine 25 mg THREE times a day Take metoprolol once a day, imdur once a day, amlodipine once a day, and spironolactone once a day too for your blood pressure We have placed an order for your mammogram and for you to be called to schedule a colonoscopy since you are overdue I ordered home health for you   I have ordered the following labs for you:  Lab Orders  No laboratory test(s) ordered today      Referrals ordered today:   Referral Orders         Ambulatory referral to Ophthalmology         Ambulatory referral to Gastroenterology         Ambulatory referral to Home Health      I have ordered the following medication/changed the following medications:   Stop the following medications: Medications Discontinued During This Encounter  Medication Reason   Dulaglutide (TRULICITY) 1.5 MG/0.5ML SOPN      Start the following medications: Meds ordered this encounter  Medications   Dulaglutide (TRULICITY) 3 MG/0.5ML SOPN    Sig: Inject 3 mg as directed once a week.    Dispense:  3 mL    Refill:  1     Follow up: 2 months   Should you have any questions or concerns please call the internal medicine clinic at [phone].     Elza Rafter, D.O. [facility]

## 2023-09-28 NOTE — Assessment & Plan Note (Signed)
A1c one month ago was 9.4%. She is on lantus 25u at bedtime and her Trulicity was increased to 1.5 mg/week. She continues to have neuropathy, but denies any new polyuria or polydipsia. She takes gabapentin twice a day for her neuropathy and states that it is worse at night, so I advised her she can take two tablets of the 300 mg gabapentin at night for her symptoms.  As far as her glucose levels, she has not been checking her levels at home. She has a freestyle libre but needs assistance placing it, thus I advised her to make an appt with [name] ASAP to get help with this.   Plan: - Referral to ophtho today  - Continue lantus 25u daily - Increase Trulicity to 3 mg/week - Follow up in ~ 2 months for A1c recheck

## 2023-09-28 NOTE — Assessment & Plan Note (Signed)
-   Referral for screening colonoscopy placed -Order for screening mammogram -Flu shot given today

## 2023-09-28 NOTE — Assessment & Plan Note (Signed)
The patient underwent right fifth ray amputation in June for osteomyelitis of her right fifth toe.  She was advised to get home health physical therapy at this time, but this fell through and never happened.  The patient is hoping to get established with home health physical therapy now, to increase her ability to ambulate safely after this amputation.  I think she would greatly benefit from physical therapy to help her safely adapt to ambulating on her own and so she can better perform her own ADLs.

## 2023-10-04 ENCOUNTER — Ambulatory Visit: Payer: PRIVATE HEALTH INSURANCE

## 2023-10-04 ENCOUNTER — Other Ambulatory Visit: Payer: Self-pay | Admitting: Student

## 2023-10-04 ENCOUNTER — Telehealth: Payer: Self-pay | Admitting: Internal Medicine

## 2023-10-04 DIAGNOSIS — E1165 Type 2 diabetes mellitus with hyperglycemia: Secondary | ICD-10-CM

## 2023-10-04 NOTE — Progress Notes (Signed)
Internal Medicine Clinic Attending  Case discussed with the resident at the time of the visit.  We reviewed the resident's history and exam and pertinent patient test results.  I agree with the assessment, diagnosis, and plan of care documented in the resident's note.  

## 2023-10-04 NOTE — Telephone Encounter (Signed)
Rec'd call from the pt who states she has still not been able to get her Diabetic Shoes from the [facility]. Pt's states her foot exam was on 08/31/2023 with Dr. Versie Starks but, no DME order was ever placed to send to their company for her shoes.  Can a DME order be placed as soon as possible as the pt is very upset that she has not been able to obtain her shoes?

## 2023-10-05 ENCOUNTER — Encounter: Payer: Medicaid Other | Admitting: Dietician

## 2023-10-12 ENCOUNTER — Encounter: Payer: Medicaid Other | Admitting: Radiology

## 2023-10-14 ENCOUNTER — Telehealth: Payer: Self-pay | Admitting: *Deleted

## 2023-10-14 NOTE — Telephone Encounter (Signed)
Call from [place], [place] [employer] [facility] requesting VO for patient.  PT 1 time a week x1.  2 times a week x 3 and then 1 time a week x1  Post Stroke.  Investment banker, operational, Electrical engineer.  Requesting also a DME orders for a Shower Tub Bench, Drop down Bedside Commode, and a [employer].  Patient is currently on Hydralazine 3 times a day but is only taking 2 times a day.  Plans to talk with her Cardiologist about.  Has lots of Trulicity 0.75 mg in her refrigerator.  Patient was recently changed to a different dose.  Patient is unable to check her sugars.  Suggestion to have patient call for an appointment with D. Plyler the Diabetes Educator in the Clinics and to bring along the friend who will be helping her to apply the meter.  Elzie Rings will ask patient to call for the appointment if she would like to do this.  Verbal ok was given for the PT and will forward to PCP for approval or denial.

## 2023-10-18 ENCOUNTER — Other Ambulatory Visit: Payer: Self-pay | Admitting: Internal Medicine

## 2023-10-19 ENCOUNTER — Encounter: Payer: Self-pay | Admitting: Podiatry

## 2023-10-19 ENCOUNTER — Ambulatory Visit (INDEPENDENT_AMBULATORY_CARE_PROVIDER_SITE_OTHER): Payer: Medicaid Other | Admitting: Podiatry

## 2023-10-19 DIAGNOSIS — M7752 Other enthesopathy of left foot: Secondary | ICD-10-CM | POA: Diagnosis not present

## 2023-10-19 DIAGNOSIS — M216X2 Other acquired deformities of left foot: Secondary | ICD-10-CM | POA: Diagnosis not present

## 2023-10-19 NOTE — Progress Notes (Signed)
Subjective:  Patient ID: [name], female    DOB: 1966/01/12,  MRN: [id number]  Chief Complaint  Patient presents with   Diabetes    PATIENT STATES THAT HER BILATERAL FEET BEEN HURTING AND THEY BEEN GIVING HER PROBLEMS , PATIENT TAKES TYLENOL FOR PAIN , THEY STAY COLD AND NUM . IT HAS BEEN LIKE THIS SINCE THE LAST VISIT .     57 y.o. female presents with the above complaint.  Patient presents with left fifth MTP capsulitis with underlying plantarflexed fifth metatarsal patient states is painful to touch is progressive gotten worse just wants to make sure that she is not opening closer.  She does not have a gym much feeling to the left lateral side.  Hurts with ambulation.  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  She was bracing for her.   Review of Systems: Negative except as noted in the HPI. Denies N/V/F/Ch.  Past Medical History:  Diagnosis Date   Acute cystitis 06/19/2021   AKI (acute kidney injury) (HCC) 09/13/2022   AKI (acute kidney injury) (HCC) 09/13/2022   Allergic rhinitis    Arthritis    "hips, back, legs, arms" (07/04/2014)   Asthma    hx   Automatic implantable cardioverter-defibrillator in situ    Calcifying tendinitis of shoulder    Chronic combined systolic and diastolic CHF (congestive heart failure) (HCC)    EF 40-45% by echo 12/06/2012   Chronic diastolic heart failure (HCC)     Primarily diastolic CHF: Likely due to uncontrolled HTN. Last echo (8/12) with EF 45-50%, mild to moderate LVH with some asymmetric septal hypertrophy, RV normal size and systolic function. EF 50-55% by LV-gram in 6/12.    Chronic lower back pain    secondary to DJD, obsetiy, hip problems. Followed by Dr. Ivory Broad (pain management)   Chronic systolic heart failure (HCC) 05/17/2022   Chronic use of opiate for therapeutic purpose 12/14/2016   Contact with and (suspected) exposure to covid-19 02/07/2020   Coronary artery disease    questionable. LHC  05/2011 showing normal coronaries // Followed at [facility], Dr. Shirlee Latch   Cough 11/02/2022   Degeneration of lumbar or lumbosacral intervertebral disc    Diabetic foot infection (HCC) 03/05/2023   DJD (degenerative joint disease) of hip    right sided   Fatigue 04/27/2022   Frequent UTI    GERD (gastroesophageal reflux disease)    HLD (hyperlipidemia)    Hypertension    Poorly controlled. Has had HTN since age 74. Angioedema with ACEI.  24 Hr urine and renal arterial dopplers ordered . . . Never done   LBBB (left bundle branch block)    Left shoulder pain 06/30/2015   Left spastic hemiparesis (HCC) 09/23/2015   Liver disease    Lumbago 11/22/2016   Morbid obesity (HCC)    Muscle spasm 06/19/2021   Need for pneumococcal vaccine 09/09/2017   NICM (nonischemic cardiomyopathy) (HCC)    EF 45-50% in 8/12, cath 6/12 showed normal coronaries, EF 50-55% by LV gram   OSA on CPAP    sleep study in 8/12 showed moderate to severe OSA requiring CPAP   Perimenopausal 03/28/2017   Polyneuropathy in diabetes(357.2)    Presence of permanent cardiac pacemaker    Rash 07/14/2016   Seizures (HCC)    last 3 months   Shortness of breath    none now   Stroke [facility]) 12/2013   "my left side is paralyzed" (07/04/2014)  Tachycardia 07/04/2014   Thoracic or lumbosacral neuritis or radiculitis, unspecified    Tinea cruris 09/13/2022   Type II diabetes mellitus (HCC) DX: 2002   Urinary frequency 11/05/2014   Vaginal discharge 02/05/2021    Current Outpatient Medications:    Accu-Chek FastClix Lancets MISC, USE TO CHECK BLOOD SUGAR TWICE DAILY, Disp: 102 each, Rfl: 2   ACCU-CHEK GUIDE test strip, USE TO CHECK BLOOD SUGAR TWICE DAILY, Disp: 100 strip, Rfl: 2   acetaminophen (TYLENOL) 500 MG tablet, Take 2 tablets (1,000 mg total) by mouth every 8 (eight) hours., Disp: 100 tablet, Rfl: 0   amLODipine (NORVASC) 10 MG tablet, Take 1 tablet (10 mg total) by mouth daily., Disp: 90 tablet, Rfl: 3    ascorbic acid (VITAMIN C) 500 MG tablet, Take 2 tablets (1,000 mg total) by mouth daily., Disp: 100 tablet, Rfl: 0   atorvastatin (LIPITOR) 40 MG tablet, TAKE ONE TABLET BY MOUTH EVERY DAY, Disp: 30 tablet, Rfl: 3   Blood Glucose Monitoring Suppl (ACCU-CHEK GUIDE) w/Device KIT, Use As Directed, Disp: 1 kit, Rfl: 0   busPIRone (BUSPAR) 10 MG tablet, Take 1 tablet (10 mg total) by mouth 2 (two) times daily., Disp: 60 tablet, Rfl: 1   Continuous Blood Gluc Receiver (FREESTYLE LIBRE 3 READER) DEVI, 1 Device by Does not apply route continuous. Monitor sugars continuously, Disp: 1 each, Rfl: 1   Continuous Glucose Sensor (FREESTYLE LIBRE 3 SENSOR) MISC, Place new sensor every 14 days. Monitor sugars continuously., Disp: 2 each, Rfl: 11   cyanocobalamin 1000 MCG tablet, Take 1 tablet by mouth daily., Disp: , Rfl:    cyclobenzaprine (FLEXERIL) 10 MG tablet, Take 1 tablet (10 mg total) by mouth 2 (two) times daily as needed for muscle spasms., Disp: 20 tablet, Rfl: 0   diclofenac Sodium (VOLTAREN) 1 % GEL, APPLY 4 GRAMS TOPICALLY TO LEFT KNEE FOUR TIMES DAILY. (Patient taking differently: Apply 4 g topically 4 (four) times daily as needed (Pain). Left knee), Disp: 300 g, Rfl: 2   Dulaglutide (TRULICITY) 3 MG/0.5ML SOPN, Inject 3 mg as directed once a week., Disp: 3 mL, Rfl: 1   escitalopram (LEXAPRO) 20 MG tablet, Take 1 tablet (20 mg total) by mouth daily., Disp: 30 tablet, Rfl: 1   fluticasone (CUTIVATE) 0.05 % cream, Apply 1 Application topically 2 (two) times daily., Disp: 30 g, Rfl: 1   folic acid (FOLVITE) 1 MG tablet, Take 1 mg by mouth daily., Disp: , Rfl:    furosemide (LASIX) 40 MG tablet, Take 1 tablet (40 mg total) by mouth daily., Disp: 30 tablet, Rfl: 6   gabapentin (NEURONTIN) 300 MG capsule, Take 1 capsule (300 mg total) by mouth 2 (two) times daily with breakfast and lunch AND 2 capsules (600 mg total) at bedtime., Disp: 120 capsule, Rfl: 1   GNP ULTICARE PEN NEEDLES 31G X 8 MM MISC, USE TO  INJECT INSULIN IN THE MORNING, NOON, EVENING, AND BEDTIME, Disp: 200 each, Rfl: 0   hydrALAZINE (APRESOLINE) 25 MG tablet, Take 1 tablet (25 mg total) by mouth 2 (two) times daily., Disp: 60 tablet, Rfl: 6   insulin aspart (NOVOLOG) cartridge, Inject into the skin. Use for Freestyle libre pump, Disp: , Rfl:    insulin glargine (LANTUS SOLOSTAR) 100 UNIT/ML Solostar Pen, INJECT 25 UNITS into THE SKIN EVERY DAY, Disp: 15 mL, Rfl: 1   isosorbide mononitrate (IMDUR) 60 MG 24 hr tablet, Take 1.5 tablets (90 mg total) by mouth daily., Disp: 45 tablet, Rfl: 6  ketoconazole (NIZORAL) 2 % cream, Apply topically daily., Disp: 15 g, Rfl: 0   levETIRAcetam (KEPPRA) 500 MG tablet, TAKE ONE TABLET BY MOUTH TWICE DAILY (Patient taking differently: Take 500 mg by mouth daily.), Disp: 180 tablet, Rfl: 2   linaclotide (LINZESS) 290 MCG CAPS capsule, TAKE ONE CAPSULE BY MOUTH EVERY DAY AS NEEDED FOR CONSTIPATION, Disp: 30 capsule, Rfl: 3   liver oil-zinc oxide (DESITIN) 40 % ointment, Apply topically every 4 (four) hours. (Patient taking differently: Apply 1 Application topically as needed for irritation.), Disp: 56.7 g, Rfl: 0   metoprolol succinate (TOPROL-XL) 50 MG 24 hr tablet, Take 1 tablet (50 mg total) by mouth daily., Disp: 30 tablet, Rfl: 6   nystatin (MYCOSTATIN/NYSTOP) powder, Apply 1 Application topically 3 (three) times daily., Disp: 15 g, Rfl: 0   oxyCODONE (ROXICODONE) 5 MG immediate release tablet, Take 1 tablet (5 mg total) by mouth every 6 (six) hours as needed., Disp: 12 tablet, Rfl: 0   pantoprazole (PROTONIX) 40 MG tablet, TAKE ONE TABLET BY MOUTH EVERY DAY, Disp: 90 tablet, Rfl: 2   potassium chloride SA (KLOR-CON M20) 20 MEQ tablet, Take 1 tablet (20 mEq total) by mouth daily., Disp: 30 tablet, Rfl: 6   rivaroxaban (XARELTO) 20 MG TABS tablet, Take 1 tablet (20 mg total) by mouth daily with supper., Disp: 30 tablet, Rfl: 11   spironolactone (ALDACTONE) 100 MG tablet, TAKE ONE TABLET BY MOUTH  EVERY DAY, Disp: 90 tablet, Rfl: 2   STOOL SOFTENER/LAXATIVE 50-8.6 MG tablet, TAKE ONE TABLET BY MOUTH AT BEDTIME AS NEEDED FOR MILD CONSTIPATION, Disp: 30 tablet, Rfl: 0   talc powder, Apply topically as needed., Disp: 240 g, Rfl: 0   traZODone (DESYREL) 100 MG tablet, Take 1 tablet (100 mg total) by mouth at bedtime., Disp: 30 tablet, Rfl: 1   VENTOLIN HFA 108 (90 Base) MCG/ACT inhaler, INHALE TWO PUFFS into the lungs EVERY 6 HOURS AS NEEDED FOR WHEEZING OR SHORTNESS OF BREATH, Disp: 36 g, Rfl: 0   Vitamin D, Ergocalciferol, (DRISDOL) 1.25 MG (50000 UNIT) CAPS capsule, TAKE ONE CAPSULE BY MOUTH EVERY 7 DAYS, Disp: 5 capsule, Rfl: 5   Zinc Sulfate 220 (50 Zn) MG TABS, Take 1 tablet (220 mg total) by mouth daily., Disp: 100 tablet, Rfl: 0  Social History   Tobacco Use  Smoking Status Never  Smokeless Tobacco Never    Allergies  Allergen Reactions   Ace Inhibitors Anaphylaxis and Other (See Comments)    Swelling of the tongue and throat    Cleocin [Clindamycin] Anaphylaxis and Swelling   Enalapril Maleate Anaphylaxis and Other (See Comments)    Swelling of the tongue and throat    Zestril [Lisinopril] Anaphylaxis and Other (See Comments)    Swelling of the tongue and throat    Objective:  There were no vitals filed for this visit. There is no height or weight on file to calculate BMI. Constitutional Well developed. Well nourished.  Vascular Dorsalis pedis pulses palpable bilaterally. Posterior tibial pulses palpable bilaterally. Capillary refill normal to all digits.  No cyanosis or clubbing noted. Pedal hair growth normal.  Neurologic Normal speech. Oriented to person, place, and time. Epicritic sensation to light touch grossly present bilaterally.  Dermatologic Nails well groomed and normal in appearance. No open wounds. No skin lesions.  Orthopedic: Left fifth metatarsophalangeal joint capsulitis with underlying plantarflexed fifth metatarsal noted pain on palpation.    Radiographs: None Assessment:   1. Capsulitis of metatarsophalangeal (MTP) joint of left foot  2. Other acquired deformities of left foot    Plan:  Patient was evaluated and treated and all questions answered.  Left fifth metatarsophalangeal joint callus with underlying plantarflexed fifth metatarsal/foot deformity -All questions and concerns were discussed with the patient extensive due to given the amount of pain that she is having she will benefit from steroid injection help decrease inflammatory component/foot pain.  Patient agrees with plan with the plan and would like to proceed with steroid injection -A steroid injection was performed at left fifth metatarsophalangeal joint using 1% plain Lidocaine and 10 mg of Kenalog. This was well tolerated. -Discussed shoe gear modification and offloading.  Encouraged her to use pillow to offload the fifth metatarsal bone in the wheelchair   No follow-ups on file.

## 2023-10-20 ENCOUNTER — Other Ambulatory Visit: Payer: Self-pay | Admitting: Internal Medicine

## 2023-10-21 ENCOUNTER — Telehealth: Payer: Self-pay

## 2023-10-21 ENCOUNTER — Other Ambulatory Visit (HOSPITAL_COMMUNITY): Payer: Self-pay

## 2023-10-21 NOTE — Telephone Encounter (Signed)
Pharmacy Patient Advocate Encounter   Received notification from Fax that prior authorization for TRULICITY is required/requested.   Insurance verification completed.   The patient is insured through [facility] MEDICAID .   Per test claim: PA required; PA submitted to above mentioned insurance via [employer] Key/confirmation #/EOC [id number] W Status is pending  (Submitted under [employer])

## 2023-10-27 ENCOUNTER — Other Ambulatory Visit: Payer: Self-pay

## 2023-10-28 MED ORDER — GNP ULTICARE PEN NEEDLES 31G X 8 MM MISC: 1.0000 | Freq: Four times a day (QID) | 1 refills | Status: DC

## 2023-10-28 NOTE — Telephone Encounter (Signed)
Pharmacy Patient Advocate Encounter  Received notification from [facility] MEDICAID that Prior Authorization for TRULICITY has been APPROVED from 10/21/23 to 10/20/24

## 2023-10-31 ENCOUNTER — Ambulatory Visit
Admission: RE | Admit: 2023-10-31 | Discharge: 2023-10-31 | Disposition: A | Discharge status: Home / Self Care | Payer: Medicaid Other | Source: Ambulatory Visit

## 2023-10-31 DIAGNOSIS — Z1231 Encounter for screening mammogram for malignant neoplasm of breast: Secondary | ICD-10-CM

## 2023-11-03 NOTE — Progress Notes (Signed)
Unable to reach patient by phone regarding normal mammogram results. Letter of results mailed to patient from [facility].

## 2023-11-14 ENCOUNTER — Telehealth: Payer: Self-pay | Admitting: *Deleted

## 2023-11-14 ENCOUNTER — Other Ambulatory Visit: Payer: Self-pay | Admitting: Internal Medicine

## 2023-11-14 NOTE — Telephone Encounter (Signed)
Call from [place] [facility] with [facility] stated pt's pain is #8 out of 10 in her feet and back - she's taking Gabapentin TID. Informed Flexeril was refilled today. Stated pt has a cut on her right leg. Stated pt is doing better with the walker; she has 6 visits left - PT wants to modify visits to 2 times a week x 2 then 1 time a week x 2. VO was given to modify visits.  I called pt about the cut/wound on her leg. Pt stated the doctor is aware of the wound and "it is not bad". Stated she rubs the scab off when she is sleeping. And she will call and schedule an appt if the wound becomes worse.

## 2023-11-15 ENCOUNTER — Encounter: Payer: Self-pay | Admitting: Radiology

## 2023-11-15 ENCOUNTER — Other Ambulatory Visit (HOSPITAL_COMMUNITY)
Admission: RE | Admit: 2023-11-15 | Discharge: 2023-11-15 | Disposition: A | Discharge status: Home / Self Care | Payer: Medicaid Other | Source: Ambulatory Visit | Attending: Radiology | Admitting: Radiology

## 2023-11-15 ENCOUNTER — Ambulatory Visit (INDEPENDENT_AMBULATORY_CARE_PROVIDER_SITE_OTHER): Payer: Medicaid Other | Admitting: Radiology

## 2023-11-15 VITALS — BP 156/94 | Ht 69.0 in | Wt 245.0 lb

## 2023-11-15 DIAGNOSIS — N760 Acute vaginitis: Secondary | ICD-10-CM

## 2023-11-15 DIAGNOSIS — Z01419 Encounter for gynecological examination (general) (routine) without abnormal findings: Secondary | ICD-10-CM | POA: Diagnosis present

## 2023-11-15 DIAGNOSIS — N898 Other specified noninflammatory disorders of vagina: Secondary | ICD-10-CM

## 2023-11-15 DIAGNOSIS — I10 Essential (primary) hypertension: Secondary | ICD-10-CM

## 2023-11-15 DIAGNOSIS — N952 Postmenopausal atrophic vaginitis: Secondary | ICD-10-CM

## 2023-11-15 DIAGNOSIS — Z01411 Encounter for gynecological examination (general) (routine) with abnormal findings: Secondary | ICD-10-CM

## 2023-11-15 DIAGNOSIS — N958 Other specified menopausal and perimenopausal disorders: Secondary | ICD-10-CM

## 2023-11-15 LAB — WET PREP FOR TRICH, YEAST, CLUE

## 2023-11-15 MED ORDER — NYSTATIN-TRIAMCINOLONE 100000-0.1 UNIT/GM-% EX OINT: 1.0000 | TOPICAL_OINTMENT | Freq: Two times a day (BID) | CUTANEOUS | 0 refills | Status: DC

## 2023-11-15 MED ORDER — ESTRADIOL 0.1 MG/GM VA CREA: 1.0000 g | TOPICAL_CREAM | VAGINAL | 12 refills | Status: DC

## 2023-11-15 MED ORDER — NUVESSA 1.3 % VA GEL: 1.0000 | Freq: Once | VAGINAL | 0 refills | Status: AC

## 2023-11-15 NOTE — Progress Notes (Signed)
   [name] [birth year] [id number]   History: Postmenopausal 57 y.o. presents for annual exam as a new patient. She c/o vaginal odor and discharge, vaginal dryness and pain with intercourse. She has a history of abnormal pap smears, last pap was >5 years ago per patient. Complication medical history with DM, HTN, and multiple heart conditions. She has had a stroke and has left hemiparesis.   Gynecologic History Postmenopausal Last mammogram: 2024. Results were: normal Last colonoscopy: 2019   Obstetric History OB History  Gravida Para Term Preterm AB Living  2 2       2   SAB IAB Ectopic Multiple Live Births               # Outcome Date GA Lbr Len/2nd Weight Sex Type Anes PTL Lv  2 Para           1 Para              The following portions of the patient's history were reviewed and updated as appropriate: allergies, current medications, past family history, past medical history, past social history, past surgical history, and problem list.  Review of Systems Pertinent items noted in HPI and remainder of comprehensive ROS otherwise negative.  Past medical history, past surgical history, family history and social history were all reviewed and documented in the EPIC chart.  Exam:  Vitals:   11/15/23 1450 11/15/23 1457  BP: (!) 158/96 (!) 156/94  Weight: 245 lb (111.1 kg)   Height: 5\' 9"  (1.753 m)    Body mass index is 36.18 kg/m.  General appearance:  Normal Thyroid:  Symmetrical, normal in size, without palpable masses or nodularity. Respiratory  Auscultation:  Clear without wheezing or rhonchi Cardiovascular  Auscultation:  Regular rate, without rubs, murmurs or gallops  Edema/varicosities:  Not grossly evident Abdominal  Soft,nontender, without masses, guarding or rebound.  Liver/spleen:  No organomegaly noted  Hernia:  None appreciated  Skin  Inspection:  Grossly normal Breasts: Examined lying and sitting.   Right: Without masses, retractions, nipple  discharge or axillary adenopathy.   Left: Without masses, retractions, nipple discharge or axillary adenopathy. Genitourinary   Inguinal/mons:  Normal without inguinal adenopathy  External genitalia:  Erythema of the vulva bilaterally  BUS/Urethra/Skene's glands:  Normal  Vagina:  Normal appearing with normal color and discharge, no lesions. Atrophy: moderate   Cervix:  Normal appearing without discharge or lesions  Uterus:  Normal in size, shape and contour.  Midline and mobile, nontender  Adnexa/parametria:     Rt: Normal in size, without masses or tenderness.   Lt: Normal in size, without masses or tenderness.  Anus and perineum: Normal    Raynelle Fanning, CMA present for exam  Assessment/Plan:   1. Well woman exam with routine gynecological exam - Cytology - PAP( [place])  2. Vaginal odor - WET PREP FOR TRICH, YEAST, CLUE +BV, rx seen for nuvessa  3. Genitourinary syndrome of menopause - estradiol (ESTRACE VAGINAL) 0.1 MG/GM vaginal cream; Place 1 g vaginally 3 (three) times a week.  Dispense: 42.5 g; Refill: 12  4. Elevated blood pressure reading in office with diagnosis of hypertension Follow up with PCP  5. Vulvovaginitis - nystatin-triamcinolone ointment (MYCOLOG); Apply 1 Application topically 2 (two) times daily.  Dispense: 30 g; Refill: 0      Retha Bither B WHNP-BC, 3:37 PM 11/15/2023

## 2023-11-16 ENCOUNTER — Other Ambulatory Visit: Payer: Self-pay | Admitting: Radiology

## 2023-11-16 ENCOUNTER — Telehealth: Payer: Self-pay | Admitting: *Deleted

## 2023-11-16 ENCOUNTER — Telehealth: Payer: Self-pay

## 2023-11-16 DIAGNOSIS — N958 Other specified menopausal and perimenopausal disorders: Secondary | ICD-10-CM

## 2023-11-16 MED ORDER — ESTRADIOL 0.1 MG/GM VA CREA: 1.0000 g | TOPICAL_CREAM | VAGINAL | 4 refills | Status: DC

## 2023-11-16 MED ORDER — PREMARIN 0.625 MG/GM VA CREA: 1.0000 g | TOPICAL_CREAM | VAGINAL | 2 refills | Status: DC

## 2023-11-16 NOTE — Telephone Encounter (Signed)
Rx sent for premarin cream.

## 2023-11-16 NOTE — Telephone Encounter (Signed)
Dominique from [facility] left message stating Estrace Vaginal Cream not covered by plan. Covered alternatives are: Premarin Vaginal cream, Vagifem or Estring vaginal ring.  Return number [phone]  AEX 11/15/23   Jami -please review and provide alternative, if appropriate.

## 2023-11-16 NOTE — Telephone Encounter (Signed)
Call returned to [facility], spoke with Dondra Prader, advised Premarin vaginal cream sent.   Call placed to patient, left detailed message, ok per dpr. Advised of new Rx, return call  to office if any additional questions.   Encounter closed.

## 2023-11-16 NOTE — Telephone Encounter (Signed)
Prior authorization request received from covermymeds for Estradiol vaginal cream.  PA can be initiated through [employer] for phone in form [phone].  Prescription was written for estradiol vaginal cream 0.1 mg #1 42.5 gram tube, use three times a week with 12 refills.  Patient's insurance will not cover due to days supply limit is exceeded.  Limit is 34 for controlled substances and 90 for other drugs.   Sent to provider for review and possible New RX instead of PA attempt.

## 2023-11-16 NOTE — Telephone Encounter (Signed)
Rx resent for 90 day supply.

## 2023-11-17 LAB — CYTOLOGY - PAP
Comment: NEGATIVE
Diagnosis: UNDETERMINED — AB
High risk HPV: NEGATIVE

## 2023-11-29 ENCOUNTER — Other Ambulatory Visit: Payer: Self-pay | Admitting: Student

## 2023-11-29 ENCOUNTER — Ambulatory Visit: Payer: Medicaid Other | Admitting: Student

## 2023-11-29 ENCOUNTER — Encounter: Payer: Self-pay | Admitting: Student

## 2023-11-29 DIAGNOSIS — M86171 Other acute osteomyelitis, right ankle and foot: Secondary | ICD-10-CM

## 2023-11-29 DIAGNOSIS — M869 Osteomyelitis, unspecified: Secondary | ICD-10-CM

## 2023-11-29 NOTE — Progress Notes (Signed)
CC: "bedside commode, bath lift and shower chair"  This is a telephone encounter between [name] and [name] on 11/29/2023 for Home health needs. The visit was conducted with the patient located at home and [name] at [facility]. The patient's identity was confirmed using their DOB and current address. The patient has consented to being evaluated through a telephone encounter and understands the associated risks (an examination cannot be done and the patient may need to come in for an appointment) / benefits (allows the patient to remain at home, decreasing exposure to coronavirus). I personally spent 15 minutes on medical discussion.   HPI:  [name] is a 57 y.o. with PMH as below.   Please see A&P for assessment of the patient's acute and chronic medical conditions.   Past Medical History:  Diagnosis Date   Acute cystitis 06/19/2021   AKI (acute kidney injury) (HCC) 09/13/2022   AKI (acute kidney injury) (HCC) 09/13/2022   Allergic rhinitis    Arthritis    "hips, back, legs, arms" (07/04/2014)   Asthma    hx   Automatic implantable cardioverter-defibrillator in situ    Calcifying tendinitis of shoulder    Chronic combined systolic and diastolic CHF (congestive heart failure) (HCC)    EF 40-45% by echo 12/06/2012   Chronic diastolic heart failure (HCC)     Primarily diastolic CHF: Likely due to uncontrolled HTN. Last echo (8/12) with EF 45-50%, mild to moderate LVH with some asymmetric septal hypertrophy, RV normal size and systolic function. EF 50-55% by LV-gram in 6/12.    Chronic lower back pain    secondary to DJD, obsetiy, hip problems. Followed by Dr. Ivory Broad (pain management)   Chronic systolic heart failure (HCC) 05/17/2022   Chronic use of opiate for therapeutic purpose 12/14/2016   Contact with and (suspected) exposure to covid-19 02/07/2020   Coronary artery disease    questionable. LHC 05/2011 showing normal coronaries // Followed at  [facility], Dr. Shirlee Latch   Cough 11/02/2022   Degeneration of lumbar or lumbosacral intervertebral disc    Diabetic foot infection (HCC) 03/05/2023   DJD (degenerative joint disease) of hip    right sided   Fatigue 04/27/2022   Frequent UTI    GERD (gastroesophageal reflux disease)    HLD (hyperlipidemia)    Hypertension    Poorly controlled. Has had HTN since age 24. Angioedema with ACEI.  24 Hr urine and renal arterial dopplers ordered . . . Never done   LBBB (left bundle branch block)    Left shoulder pain 06/30/2015   Left spastic hemiparesis (HCC) 09/23/2015   Liver disease    Lumbago 11/22/2016   Morbid obesity (HCC)    Muscle spasm 06/19/2021   Need for pneumococcal vaccine 09/09/2017   NICM (nonischemic cardiomyopathy) (HCC)    EF 45-50% in 8/12, cath 6/12 showed normal coronaries, EF 50-55% by LV gram   OSA on CPAP    sleep study in 8/12 showed moderate to severe OSA requiring CPAP   Perimenopausal 03/28/2017   Polyneuropathy in diabetes(357.2)    Presence of permanent cardiac pacemaker    Rash 07/14/2016   Seizures (HCC)    last 3 months   Shortness of breath    none now   Stroke [facility]) 12/2013   "my left side is paralyzed" (07/04/2014)   Tachycardia 07/04/2014   Thoracic or lumbosacral neuritis or radiculitis, unspecified    Tinea cruris 09/13/2022   Type II diabetes mellitus (HCC)  DX: 2002   Urinary frequency 11/05/2014   Vaginal discharge 02/05/2021   Review of Systems:    ROS negative except for what is noted on the assessment and plan.  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  Reports that she needs a bedside commode with arms going down, pad lift and shower chair.  States that the initial shower chair that was sent to her had legs that were broken.  She also reports she needs pads for her heavy flow, the brief  that is sent to her is not controlling for her heavy menstruation.  Otherwise she has nurse that visits her every day.  She had her last physical therapy session yesterday.  She reports that she is improving on her ADLs, states that she just needs help with these other supplies.  No other concerns at this time.  Denies any other falls.  Plan:  -Referral for DME is placed, to get the supplies that she asked for.    Patient discussed with Dr. Abel Presto, DO Internal Medicine Resident 11/29/23

## 2023-11-29 NOTE — Progress Notes (Deleted)
Established Patient Office Visit  Subjective   Patient ID: [name], female    DOB: 1966/04/29  Age: 57 y.o. MRN: [id number]  No chief complaint on file.  This is a 57 year old female living with a history stated below and presents today for requiring bedside commode, bath lift and shower chair. Please see problem based assessment and plan for additional details.   Patient Active Problem List   Diagnosis Date Noted   Osteomyelitis of fifth toe of right foot (HCC) 05/16/2023   Type 2 diabetes mellitus with hyperglycemia, with long-term current use of insulin (HCC) 03/07/2023   PTSD (post-traumatic stress disorder) 01/20/2023   Anxiety state 01/20/2023   Insomnia due to other mental disorder 01/20/2023   Housing instability 11/18/2022   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   Atrial fibrillation (HCC) 09/13/2019   Vitamin B12 deficiency 09/15/2017   Lumbar radiculopathy 08/03/2017   Constipation 09/09/2015   Localization-related symptomatic epilepsy and epileptic syndromes with complex partial seizures, not intractable, without status epilepticus (HCC) 06/13/2015   Hemiparesis affecting left side as late effect of cerebrovascular accident (HCC) 06/13/2015   Morbid obesity (HCC) 06/13/2015   Intertrigo 11/05/2014   Vitamin D deficiency 10/01/2014   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/23/2013   Mixed stress and urge urinary incontinence 10/11/2013   Health care maintenance 09/13/2013   Chronic combined systolic and diastolic heart failure (HCC) 08/01/2013   Nonischemic cardiomyopathy (HCC) 08/01/2013   LBBB (left bundle branch block) 07/19/2013   Depression 04/09/2013   Primary osteoarthritis of left knee 04/14/2012   Lumbar spondylosis 04/14/2012   Poorly controlled type 2 diabetes mellitus with neuropathy (HCC) 04/14/2012   Hyperlipidemia 11/23/2011   OSA (obstructive sleep apnea) 08/27/2011   Benign essential HTN 08/27/2010   Past Medical History:   Diagnosis Date   Acute cystitis 06/19/2021   AKI (acute kidney injury) (HCC) 09/13/2022   AKI (acute kidney injury) (HCC) 09/13/2022   Allergic rhinitis    Arthritis    "hips, back, legs, arms" (07/04/2014)   Asthma    hx   Automatic implantable cardioverter-defibrillator in situ    Calcifying tendinitis of shoulder    Chronic combined systolic and diastolic CHF (congestive heart failure) (HCC)    EF 40-45% by echo 12/06/2012   Chronic diastolic heart failure (HCC)     Primarily diastolic CHF: Likely due to uncontrolled HTN. Last echo (8/12) with EF 45-50%, mild to moderate LVH with some asymmetric septal hypertrophy, RV normal size and systolic function. EF 50-55% by LV-gram in 6/12.    Chronic lower back pain    secondary to DJD, obsetiy, hip problems. Followed by Dr. Ivory Broad (pain management)   Chronic systolic heart failure (HCC) 05/17/2022   Chronic use of opiate for therapeutic purpose 12/14/2016   Contact with and (suspected) exposure to covid-19 02/07/2020   Coronary artery disease    questionable. LHC 05/2011 showing normal coronaries // [facility] at [facility], Dr. Shirlee Latch   Cough 11/02/2022   Degeneration of lumbar or lumbosacral intervertebral disc    Diabetic foot infection (HCC) 03/05/2023   DJD (degenerative joint disease) of hip    right sided   Fatigue 04/27/2022   Frequent UTI    GERD (gastroesophageal reflux disease)    HLD (hyperlipidemia)    Hypertension    Poorly controlled. Has had HTN since age 21. Angioedema with ACEI.  24 Hr urine and renal arterial dopplers ordered . . . Never done   LBBB (left bundle branch block)  Left shoulder pain 06/30/2015   Left spastic hemiparesis (HCC) 09/23/2015   Liver disease    Lumbago 11/22/2016   Morbid obesity (HCC)    Muscle spasm 06/19/2021   Need for pneumococcal vaccine 09/09/2017   NICM (nonischemic cardiomyopathy) (HCC)    EF 45-50% in 8/12, cath 6/12 showed normal coronaries, EF 50-55% by LV gram    OSA on CPAP    sleep study in 8/12 showed moderate to severe OSA requiring CPAP   Perimenopausal 03/28/2017   Polyneuropathy in diabetes(357.2)    Presence of permanent cardiac pacemaker    Rash 07/14/2016   Seizures (HCC)    last 3 months   Shortness of breath    none now   Stroke (HCC) 12/2013   "my left side is paralyzed" (07/04/2014)   Tachycardia 07/04/2014   Thoracic or lumbosacral neuritis or radiculitis, unspecified    Tinea cruris 09/13/2022   Type II diabetes mellitus (HCC) DX: 2002   Urinary frequency 11/05/2014   Vaginal discharge 02/05/2021   Past Surgical History:  Procedure Laterality Date   AMPUTATION Right 05/18/2023   Procedure: RIGHT FOTT 5TH TOE AMPUTATION;  Surgeon: [name], MD;  Location: [facility] OR;  Service: Orthopedics;  Laterality: Right;   BI-VENTRICULAR IMPLANTABLE CARDIOVERTER DEFIBRILLATOR N/A 08/22/2013   Procedure: BI-VENTRICULAR IMPLANTABLE CARDIOVERTER DEFIBRILLATOR  (CRT-D);  Surgeon: [name], MD;  Location: [facility] CATH LAB;  Service: Cardiovascular;  Laterality: N/A;   BI-VENTRICULAR IMPLANTABLE CARDIOVERTER DEFIBRILLATOR  (CRT-D)  08/2013   [name] 08/23/2013   BIV ICD GENERATOR CHANGEOUT N/A 03/18/2021   Procedure: BIV ICD GENERATOR CHANGEOUT;  Surgeon: [name], MD;  Location: [facility] INVASIVE CV LAB;  Service: Cardiovascular;  Laterality: N/A;   BREAST SURGERY Bilateral 2011   patient reports benign results   CARDIAC CATHETERIZATION  05/2011   CARPAL TUNNEL RELEASE Left    denies   HEMIARTHROPLASTY SHOULDER FRACTURE Right 1980's   denies   I & D EXTREMITY Right 03/06/2023   Procedure: IRRIGATION AND DEBRIDEMENT OF RIGHT FOOT;  Surgeon: [name], MD;  Location: [facility] OR;  Service: Orthopedics;  Laterality: Right;   I & D EXTREMITY Right 03/10/2023   Procedure: RIGHT FOOT INCISION AND DEBRIDEMENT;  Surgeon: [name], MD;  Location: [facility] OR;  Service: Orthopedics;  Laterality: Right;   MULTIPLE EXTRACTIONS WITH ALVEOLOPLASTY  Bilateral 05/20/2017   Procedure: MULTIPLE EXTRACTION;  Surgeon: [name], DDS;  Location: [facility] OR;  Service: Oral Surgery;  Laterality: Bilateral;   MULTIPLE TOOTH EXTRACTIONS  ~ 2011   tumors removed ; "my whole top"   SHOULDER ARTHROSCOPY Right 12/26/2015   Procedure: Right Shoulder Arthroscopy, Debridement, and Decompression;  Surgeon: [name], MD;  Location: [facility] OR;  Service: Orthopedics;  Laterality: Right;   TEE WITHOUT CARDIOVERSION N/A 01/14/2014   Procedure: TRANSESOPHAGEAL ECHOCARDIOGRAM (TEE);  Surgeon: Thurmon Fair, MD;  Location: [facility] ENDOSCOPY;  Service: Cardiovascular;  Laterality: N/A;   TUBAL LIGATION  05/31/1985   Social History   Tobacco Use   Smoking status: Never    Passive exposure: Current   Smokeless tobacco: Never  Vaping Use   Vaping status: Never Used  Substance Use Topics   Alcohol use: No    Alcohol/week: 0.0 standard drinks of alcohol   Drug use: No   Family Status  Relation Name Status   Mother  Deceased   Father  Alive   PGM  (Not Specified)   PGF  (Not Specified)   Brother  (Not Specified)  Brother  (Not Specified)   Emelda Brothers  (Not Specified)   MGM  (Not Specified)  No partnership data on file   Family History  Problem Relation Age of Onset   Heart disease Mother 3       Died of MI at age 57 yo   Kidney disease Mother        requiring dialysis   Congestive Heart Failure Mother    Heart disease Father 62       MI age 28yo requiring stenting   Diabetes Father    Glaucoma Father    Heart disease Paternal Grandmother        requiring pacemaker.   Heart disease Paternal Grandfather 63       Died of MI at possibly age 29-53yo   Stroke Paternal Grandfather    Diabetes Brother    Heart disease Brother 73       MI at age 39 years old   Breast cancer Paternal Aunt    Breast cancer Maternal Grandmother    Allergies  Allergen Reactions   Ace Inhibitors Anaphylaxis and Other (See Comments)    Swelling of the tongue and throat     Cleocin [Clindamycin] Anaphylaxis and Swelling   Enalapril Maleate Anaphylaxis and Other (See Comments)    Swelling of the tongue and throat    Zestril [Lisinopril] Anaphylaxis and Other (See Comments)    Swelling of the tongue and throat      ROS ROS negative except for what is noted on the assessment and plan.    Objective:     LMP 09/13/2019 (Exact Date)  BP Readings from Last 3 Encounters:  11/15/23 (!) 156/94  09/28/23 (!) 160/88  08/31/23 (!) 155/88   Wt Readings from Last 3 Encounters:  11/15/23 245 lb (111.1 kg)  09/28/23 257 lb 12.8 oz (116.9 kg)  08/26/23 235 lb (106.6 kg)      Physical Exam *  No results found for any visits on 11/29/23.  Last CBC Lab Results  Component Value Date   WBC 8.5 07/27/2023   HGB 11.5 (L) 07/27/2023   HCT 37.6 07/27/2023   MCV 83.4 07/27/2023   MCH 25.5 (L) 07/27/2023   RDW 13.2 07/27/2023   PLT 237 07/27/2023   Last metabolic panel Lab Results  Component Value Date   GLUCOSE 238 (H) 07/27/2023   NA 135 07/27/2023   K 3.7 07/27/2023   CL 99 07/27/2023   CO2 25 07/27/2023   BUN <5 (L) 07/27/2023   CREATININE 0.80 07/27/2023   GFRNONAA >60 07/27/2023   CALCIUM 9.1 07/27/2023   PHOS 2.6 05/20/2023   PROT 6.2 (L) 05/16/2023   ALBUMIN 2.3 (L) 05/20/2023   BILITOT 1.6 (H) 05/16/2023   ALKPHOS 65 05/16/2023   AST 10 (L) 05/16/2023   ALT 12 05/16/2023   ANIONGAP 11 07/27/2023   Last lipids Lab Results  Component Value Date   CHOL 177 11/18/2022   HDL 48 11/18/2022   LDLCALC 97 11/18/2022   LDLDIRECT 110.9 08/27/2011   TRIG 188 (H) 11/18/2022   CHOLHDL 3.7 11/18/2022   Last hemoglobin A1c Lab Results  Component Value Date   HGBA1C 9.4 (A) 08/31/2023      The ASCVD Risk score (Arnett DK, et al., 2019) failed to calculate for the following reasons:   Risk score cannot be calculated because patient has a medical history suggesting prior/existing ASCVD    Assessment & Plan:  Diabetes Lab Results   Component Value Date  HGBA1C 9.4 (A) 08/31/2023    -Referral to ophthalmology -Lantus 25 units daily -Trulicity increased to 3 mg/week -A1c rechecked this visit  Hypertension BP Readings from Last 3 Encounters:  11/15/23 (!) 156/94  09/28/23 (!) 160/88  08/31/23 (!) 155/88    -Home medication amlodipine 10 mg, Imdur 90 mg, spironolactone 100 mg, metoprolol 10 mg. -Change, hydralazine 25 mg 3 times daily -Evaluate BP  Osteomyelitis of the fifth toe of right foot in June -Home health -Physical therapy - bedside commode, bath lift and shower chair  Problem List Items Addressed This Visit   None   No follow-ups on file.    Jeral Pinch, DO

## 2023-11-29 NOTE — Patient Instructions (Addendum)
Thank you, Ms.[name] for allowing [place] to provide your care today. Today we discussed DME Supplies.    I sent a referral to our social worker for supplies that you asked for.  They will contact you and address your needs.  I have ordered the following labs for you:  Lab Orders  No laboratory test(s) ordered today     Tests ordered today:  None  Referrals ordered today:    Referral Orders         Ambulatory Referral for DME      I have ordered the following medication/changed the following medications:   Stop the following medications: There are no discontinued medications.   Start the following medications: No orders of the defined types were placed in this encounter.    Follow up: 2 months for DME    Remember:   Should you have any questions or concerns please call the internal medicine clinic at [phone].     Jeral Pinch, DO [facility]

## 2023-11-29 NOTE — Assessment & Plan Note (Addendum)
Called patient today, spoke with her, she addressed concerns about needing home health supplies for her ADLs.  Reports that she needs a bedside commode with arms going down, pad lift and shower chair.  States that the initial shower chair that was sent to her had legs that were broken.  She also reports she needs pads for her heavy flow, the brief that is sent to her is not controlling for her heavy menstruation.  Otherwise she has nurse that visits her every day.  She had her last physical therapy session yesterday.  She reports that she is improving on her ADLs, states that she just needs help with these other supplies.  No other concerns at this time.  Denies any other falls.  Plan:  -Referral for DME is placed, to get the supplies that she asked for.

## 2023-12-05 ENCOUNTER — Other Ambulatory Visit: Payer: Self-pay | Admitting: Internal Medicine

## 2023-12-05 DIAGNOSIS — E785 Hyperlipidemia, unspecified: Secondary | ICD-10-CM

## 2023-12-05 NOTE — Progress Notes (Signed)
Internal Medicine Clinic Attending  I was physically present during the key portions of the resident provided service and participated in the medical decision making of patient's management care. I reviewed pertinent patient test results.  The assessment, diagnosis, and plan were formulated together and I agree with the documentation in the resident's note.  Gust Rung, DO

## 2023-12-12 ENCOUNTER — Telehealth: Payer: Self-pay | Admitting: *Deleted

## 2023-12-12 NOTE — Telephone Encounter (Signed)
Call from [facility] wanted to report that patient scored a 9 on the Depression Screening. Patient continues to be mildly depressed.  Patient is doing well with her PT visits.

## 2023-12-15 ENCOUNTER — Other Ambulatory Visit: Payer: Self-pay | Admitting: Internal Medicine

## 2024-01-02 ENCOUNTER — Other Ambulatory Visit: Payer: Self-pay | Admitting: Student

## 2024-01-02 DIAGNOSIS — Z794 Long term (current) use of insulin: Secondary | ICD-10-CM

## 2024-01-03 ENCOUNTER — Other Ambulatory Visit: Payer: Self-pay

## 2024-01-03 ENCOUNTER — Ambulatory Visit: Payer: PRIVATE HEALTH INSURANCE

## 2024-01-03 DIAGNOSIS — M1712 Unilateral primary osteoarthritis, left knee: Secondary | ICD-10-CM

## 2024-01-03 MED ORDER — DICLOFENAC SODIUM 1 % EX GEL: 4.0000 g | Freq: Four times a day (QID) | CUTANEOUS | 2 refills | Status: DC | PRN

## 2024-01-03 NOTE — Telephone Encounter (Signed)
Patient called regarding rx for cyclobenzaprine, patient is requesting as needed to be taken off of the rx due to her taking the medication everyday. Patient is requesting a rx refill.

## 2024-01-04 ENCOUNTER — Other Ambulatory Visit: Payer: Self-pay | Admitting: Internal Medicine

## 2024-01-04 DIAGNOSIS — G40209 Localization-related (focal) (partial) symptomatic epilepsy and epileptic syndromes with complex partial seizures, not intractable, without status epilepticus: Secondary | ICD-10-CM

## 2024-01-04 DIAGNOSIS — I10 Essential (primary) hypertension: Secondary | ICD-10-CM

## 2024-01-05 NOTE — Telephone Encounter (Signed)
Medication sent to pharmacy  

## 2024-01-10 ENCOUNTER — Encounter: Payer: Self-pay | Admitting: Internal Medicine

## 2024-01-11 ENCOUNTER — Telehealth: Payer: Self-pay | Admitting: Dietician

## 2024-01-11 NOTE — Telephone Encounter (Signed)
Call to patient about assisting her with Continuous glucose monitoring. Mailbox was full, unable to leave a message.

## 2024-01-23 ENCOUNTER — Encounter: Payer: Medicaid Other | Admitting: Internal Medicine

## 2024-01-23 NOTE — Progress Notes (Deleted)
 CC: ***  HPI:  [name] is a 58 y.o. female living with a history stated below and presents today for ***. Please see problem based assessment and plan for additional details.  Past Medical History:  Diagnosis Date   Acute cystitis 06/19/2021   AKI (acute kidney injury) (HCC) 09/13/2022   AKI (acute kidney injury) (HCC) 09/13/2022   Allergic rhinitis    Arthritis    "hips, back, legs, arms" (07/04/2014)   Asthma    hx   Automatic implantable cardioverter-defibrillator in situ    Calcifying tendinitis of shoulder    Chronic combined systolic and diastolic CHF (congestive heart failure) (HCC)    EF 40-45% by echo 12/06/2012   Chronic diastolic heart failure (HCC)     Primarily diastolic CHF: Likely due to uncontrolled HTN. Last echo (8/12) with EF 45-50%, mild to moderate LVH with some asymmetric septal hypertrophy, RV normal size and systolic function. EF 50-55% by LV-gram in 6/12.    Chronic lower back pain    secondary to DJD, obsetiy, hip problems. Followed by Dr. Halina Lever (pain management)   Chronic systolic heart failure (HCC) 05/17/2022   Chronic use of opiate for therapeutic purpose 12/14/2016   Contact with and (suspected) exposure to covid-19 02/07/2020   Coronary artery disease    questionable. LHC 05/2011 showing normal coronaries // Followed at [facility], Dr. Mitzie Anda   Cough 11/02/2022   Degeneration of lumbar or lumbosacral intervertebral disc    Diabetic foot infection (HCC) 03/05/2023   DJD (degenerative joint disease) of hip    right sided   Fatigue 04/27/2022   Frequent UTI    GERD (gastroesophageal reflux disease)    HLD (hyperlipidemia)    Hypertension    Poorly controlled. Has had HTN since age 5. Angioedema with ACEI.  24 Hr urine and renal arterial dopplers ordered . . . Never done   LBBB (left bundle branch block)    Left shoulder pain 06/30/2015   Left spastic hemiparesis (HCC) 09/23/2015   Liver disease    Lumbago  11/22/2016   Morbid obesity (HCC)    Muscle spasm 06/19/2021   Need for pneumococcal vaccine 09/09/2017   NICM (nonischemic cardiomyopathy) (HCC)    EF 45-50% in 8/12, cath 6/12 showed normal coronaries, EF 50-55% by LV gram   OSA on CPAP    sleep study in 8/12 showed moderate to severe OSA requiring CPAP   Perimenopausal 03/28/2017   Polyneuropathy in diabetes(357.2)    Presence of permanent cardiac pacemaker    Rash 07/14/2016   Seizures (HCC)    last 3 months   Shortness of breath    none now   Stroke [facility]) 12/2013   "my left side is paralyzed" (07/04/2014)   Tachycardia 07/04/2014   Thoracic or lumbosacral neuritis or radiculitis, unspecified    Tinea cruris 09/13/2022   Type II diabetes mellitus (HCC) DX: 2002   Urinary frequency 11/05/2014   Vaginal discharge 02/05/2021    Current Outpatient Medications on File Prior to Visit  Medication Sig Dispense Refill   Accu-Chek FastClix Lancets MISC USE TO CHECK BLOOD SUGAR TWICE DAILY 102 each 2   ACCU-CHEK GUIDE test strip USE TO CHECK BLOOD SUGAR TWICE DAILY 100 strip 2   acetaminophen  (TYLENOL ) 500 MG tablet Take 2 tablets (1,000 mg total) by mouth every 8 (eight) hours. 100 tablet 0   amLODipine  (NORVASC ) 10 MG tablet Take 1 tablet (10 mg total) by mouth daily. 90 tablet 3  ascorbic acid  (VITAMIN C ) 500 MG tablet Take 2 tablets (1,000 mg total) by mouth daily. 100 tablet 0   atorvastatin  (LIPITOR) 40 MG tablet TAKE ONE TABLET BY MOUTH EVERY DAY 30 tablet 3   Blood Glucose Monitoring Suppl (ACCU-CHEK GUIDE) w/Device KIT Use As Directed 1 kit 0   busPIRone  (BUSPAR ) 10 MG tablet Take 1 tablet (10 mg total) by mouth 2 (two) times daily. 60 tablet 1   conjugated estrogens  (PREMARIN ) vaginal cream Place 0.5 Applicatorfuls vaginally 3 (three) times a week. 42.5 g 2   Continuous Blood Gluc Receiver (FREESTYLE LIBRE 3 READER) DEVI 1 Device by Does not apply route continuous. Monitor sugars continuously 1 each 1   Continuous Glucose  Sensor (FREESTYLE LIBRE 3 SENSOR) MISC Place new sensor every 14 days. Monitor sugars continuously. 2 each 11   cyanocobalamin  1000 MCG tablet Take 1 tablet by mouth daily.     cyclobenzaprine  (FLEXERIL ) 10 MG tablet TAKE ONE TABLET (10mg  total) BY MOUTH TWICE DAILY, AS NEEDED muscle SPASMS 20 tablet 0   diclofenac  Sodium (VOLTAREN ) 1 % GEL Apply 4 g topically 4 (four) times daily as needed (Pain). Left knee 50 g 2   Dulaglutide  (TRULICITY ) 3 MG/0.5ML SOPN Inject 3 mg as directed once a week. 3 mL 1   escitalopram  (LEXAPRO ) 20 MG tablet Take 1 tablet (20 mg total) by mouth daily. 30 tablet 1   fluticasone  (CUTIVATE ) 0.05 % cream Apply 1 Application topically 2 (two) times daily. 30 g 1   folic acid  (FOLVITE ) 1 MG tablet Take 1 mg by mouth daily.     furosemide  (LASIX ) 40 MG tablet Take 1 tablet (40 mg total) by mouth daily. 30 tablet 6   gabapentin  (NEURONTIN ) 300 MG capsule Take 1 capsule (300 mg total) by mouth 2 (two) times daily with breakfast and lunch AND 2 capsules (600 mg total) at bedtime. 120 capsule 1   hydrALAZINE  (APRESOLINE ) 25 MG tablet Take 1 tablet (25 mg total) by mouth 2 (two) times daily. 60 tablet 6   insulin  aspart (NOVOLOG ) cartridge Inject into the skin. Use for Freestyle libre pump (Patient not taking: Reported on 11/15/2023)     insulin  glargine (LANTUS  SOLOSTAR) 100 UNIT/ML Solostar Pen INJECT 25 UNITS into THE SKIN EVERY DAY 15 mL 1   Insulin  Pen Needle (GNP ULTICARE PEN NEEDLES) 31G X 8 MM MISC Inject 1 each into the skin 4 (four) times daily. 200 each 1   isosorbide  mononitrate (IMDUR ) 60 MG 24 hr tablet Take 1.5 tablets (90 mg total) by mouth daily. 45 tablet 6   ketoconazole  (NIZORAL ) 2 % cream Apply topically daily. 15 g 0   levETIRAcetam  (KEPPRA ) 500 MG tablet TAKE ONE TABLET BY MOUTH TWICE DAILY 360 tablet 3   LINZESS  290 MCG CAPS capsule TAKE ONE CAPSULE BY MOUTH EVERY DAY AS NEEDED FOR CONSTIPATION 30 capsule 3   liver oil-zinc  oxide (DESITIN) 40 % ointment Apply  topically every 4 (four) hours. (Patient taking differently: Apply 1 Application topically as needed for irritation.) 56.7 g 0   metoprolol  succinate (TOPROL -XL) 50 MG 24 hr tablet Take 1 tablet (50 mg total) by mouth daily. 30 tablet 6   nystatin  (MYCOSTATIN /NYSTOP ) powder Apply 1 Application topically 3 (three) times daily. 15 g 0   nystatin -triamcinolone  ointment (MYCOLOG) Apply 1 Application topically 2 (two) times daily. 30 g 0   oxyCODONE  (ROXICODONE ) 5 MG immediate release tablet Take 1 tablet (5 mg total) by mouth every 6 (six) hours as needed. 12  tablet 0   pantoprazole  (PROTONIX ) 40 MG tablet TAKE ONE TABLET BY MOUTH EVERY DAY 180 tablet 3   potassium chloride  SA (KLOR-CON  M20) 20 MEQ tablet Take 1 tablet (20 mEq total) by mouth daily. 30 tablet 6   rivaroxaban  (XARELTO ) 20 MG TABS tablet Take 1 tablet (20 mg total) by mouth daily with supper. 30 tablet 11   spironolactone  (ALDACTONE ) 100 MG tablet TAKE ONE TABLET BY MOUTH EVERY DAY 180 tablet 3   STOOL SOFTENER/LAXATIVE 50-8.6 MG tablet TAKE ONE TABLET BY MOUTH AT BEDTIME AS NEEDED FOR MILD CONSTIPATION 30 tablet 0   talc  powder Apply topically as needed. 240 g 0   traZODone  (DESYREL ) 100 MG tablet Take 1 tablet (100 mg total) by mouth at bedtime. 30 tablet 1   VENTOLIN  HFA 108 (90 Base) MCG/ACT inhaler INHALE TWO puffs into THE lungs EVERY SIX HOURS AS NEEDED FOR wheezing OR For SHORTNESS OF BREATH 36 g 0   Vitamin D , Ergocalciferol , (DRISDOL ) 1.25 MG (50000 UNIT) CAPS capsule TAKE ONE CAPSULE BY MOUTH EVERY 7 DAYS 5 capsule 5   Zinc  Sulfate 220 (50 Zn) MG TABS Take 1 tablet (220 mg total) by mouth daily. 100 tablet 0   No current facility-administered medications on file prior to visit.    Family History  Problem Relation Age of Onset   Heart disease Mother 20       Died of MI at age 68 yo   Kidney disease Mother        requiring dialysis   Congestive Heart Failure Mother    Heart disease Father 49       MI age 89yo requiring  stenting   Diabetes Father    Glaucoma Father    Heart disease Paternal Grandmother        requiring pacemaker.   Heart disease Paternal Grandfather 35       Died of MI at possibly age 69-53yo   Stroke Paternal Grandfather    Diabetes Brother    Heart disease Brother 9       MI at age 28 years old   Breast cancer Paternal Aunt    Breast cancer Maternal Grandmother     Social History   Socioeconomic History   Marital status: Single    Spouse name: Not on file   Number of children: 2   Years of education: 9th grade   Highest education level: Not on file  Occupational History   Occupation: Unemployed    Comment: planning on getting disability  Tobacco Use   Smoking status: Never    Passive exposure: Current   Smokeless tobacco: Never  Vaping Use   Vaping status: Never Used  Substance and Sexual Activity   Alcohol  use: No    Alcohol /week: 0.0 standard drinks of alcohol    Drug use: No   Sexual activity: Yes    Partners: Male    Birth control/protection: Surgical, Post-menopausal    Comment: menarche 58yo, sexual debut 58yo  Other Topics Concern   Not on file  Social History Narrative   Lives in [place] with her son. Is able to read and write fluently in [place].   Social Drivers of Health   Financial Resource Strain: Medium Risk (03/23/2023)   Overall Financial Resource Strain (CARDIA)    Difficulty of Paying Living Expenses: Somewhat hard  Food Insecurity: Food Insecurity Present (03/23/2023)   Hunger Vital Sign    Worried About Running Out of Food in the Last Year: Often true  Ran Out of Food in the Last Year: Often true  Transportation Needs: Unmet Transportation Needs (03/23/2023)   PRAPARE - Administrator, Civil Service (Medical): Yes    Lack of Transportation (Non-Medical): Yes  Physical Activity: Insufficiently Active (03/23/2023)   Exercise Vital Sign    Days of Exercise per Week: 3 days    Minutes of Exercise per Session: 20 min  Stress:  Not on file  Social Connections: Moderately Isolated (03/23/2023)   Social Connection and Isolation Panel [NHANES]    Frequency of Communication with Friends and Family: More than three times a week    Frequency of Social Gatherings with Friends and Family: Three times a week    Attends Religious Services: More than 4 times per year    Active Member of Clubs or Organizations: No    Attends Banker Meetings: Never    Marital Status: Never married  Intimate Partner Violence: Not At Risk (03/23/2023)   Humiliation, Afraid, Rape, and Kick questionnaire    Fear of Current or Ex-Partner: No    Emotionally Abused: No    Physically Abused: No    Sexually Abused: No    Review of Systems: ROS negative except for what is noted on the assessment and plan.  There were no vitals filed for this visit.  Physical Exam: Constitutional: well-appearing *** sitting in ***, in no acute distress HENT: normocephalic atraumatic, mucous membranes moist Eyes: conjunctiva non-erythematous Cardiovascular: regular rate and rhythm, no m/r/g Pulmonary/Chest: normal work of breathing on room air, lungs clear to auscultation bilaterally Abdominal: soft, non-tender, non-distended MSK: normal bulk and tone Neurological: alert & oriented x 3, no focal deficit Skin: warm and dry Psych: normal mood and behavior  Assessment & Plan:   DM: A1c 9.4 >  - trulicity  3 - lantus  25u daily  - She is not on an SGLT2 due to recurrent UTIs in the past.  - urine micro - ophtho   Patient {GC/GE:[id number]::"discussed with","seen with"} Dr. {[id number]::"[id number]","G. Hoffman","Mullen","Narendra","Vincent","Guilloud","Lau","Machen"}  No problem-specific Assessment & Plan notes found for this encounter.   Horris Speros, D.O. [facility], PGY-3 Phone: [phone] Date 01/23/2024 Time 7:25 AM

## 2024-01-24 NOTE — Telephone Encounter (Signed)
Unable to reach patient by phone. Send Mychart message.

## 2024-01-30 ENCOUNTER — Other Ambulatory Visit: Payer: Self-pay | Admitting: Internal Medicine

## 2024-01-30 NOTE — Telephone Encounter (Signed)
Next appt scheduled 2/24 with PCP.

## 2024-02-01 ENCOUNTER — Other Ambulatory Visit: Payer: Self-pay | Admitting: Internal Medicine

## 2024-02-01 ENCOUNTER — Other Ambulatory Visit: Payer: Self-pay | Admitting: Student

## 2024-02-01 DIAGNOSIS — M1712 Unilateral primary osteoarthritis, left knee: Secondary | ICD-10-CM

## 2024-02-02 ENCOUNTER — Other Ambulatory Visit: Payer: Self-pay

## 2024-02-02 NOTE — Telephone Encounter (Signed)
Need to recheck vit D level at next appt

## 2024-02-06 ENCOUNTER — Encounter: Payer: Self-pay | Admitting: Dietician

## 2024-02-06 ENCOUNTER — Encounter: Payer: Medicaid Other | Admitting: Internal Medicine

## 2024-02-06 NOTE — Progress Notes (Deleted)
 CC: ***  HPI:  [name] is a 58 y.o. female living with a history stated below and presents today for ***. Please see problem based assessment and plan for additional details.  Past Medical History:  Diagnosis Date   Acute cystitis 06/19/2021   AKI (acute kidney injury) (HCC) 09/13/2022   AKI (acute kidney injury) (HCC) 09/13/2022   Allergic rhinitis    Arthritis    "hips, back, legs, arms" (07/04/2014)   Asthma    hx   Automatic implantable cardioverter-defibrillator in situ    Calcifying tendinitis of shoulder    Chronic combined systolic and diastolic CHF (congestive heart failure) (HCC)    EF 40-45% by echo 12/06/2012   Chronic diastolic heart failure (HCC)     Primarily diastolic CHF: Likely due to uncontrolled HTN. Last echo (8/12) with EF 45-50%, mild to moderate LVH with some asymmetric septal hypertrophy, RV normal size and systolic function. EF 50-55% by LV-gram in 6/12.    Chronic lower back pain    secondary to DJD, obsetiy, hip problems. Followed by Dr. Ivory Broad (pain management)   Chronic systolic heart failure (HCC) 05/17/2022   Chronic use of opiate for therapeutic purpose 12/14/2016   Contact with and (suspected) exposure to covid-19 02/07/2020   Coronary artery disease    questionable. LHC 05/2011 showing normal coronaries // Followed at [facility], Dr. Shirlee Latch   Cough 11/02/2022   Degeneration of lumbar or lumbosacral intervertebral disc    Diabetic foot infection (HCC) 03/05/2023   DJD (degenerative joint disease) of hip    right sided   Fatigue 04/27/2022   Frequent UTI    GERD (gastroesophageal reflux disease)    HLD (hyperlipidemia)    Hypertension    Poorly controlled. Has had HTN since age 46. Angioedema with ACEI.  24 Hr urine and renal arterial dopplers ordered . . . Never done   LBBB (left bundle branch block)    Left shoulder pain 06/30/2015   Left spastic hemiparesis (HCC) 09/23/2015   Liver disease    Lumbago  11/22/2016   Morbid obesity (HCC)    Muscle spasm 06/19/2021   Need for pneumococcal vaccine 09/09/2017   NICM (nonischemic cardiomyopathy) (HCC)    EF 45-50% in 8/12, cath 6/12 showed normal coronaries, EF 50-55% by LV gram   OSA on CPAP    sleep study in 8/12 showed moderate to severe OSA requiring CPAP   Perimenopausal 03/28/2017   Polyneuropathy in diabetes(357.2)    Presence of permanent cardiac pacemaker    Rash 07/14/2016   Seizures (HCC)    last 3 months   Shortness of breath    none now   Stroke [facility]) 12/2013   "my left side is paralyzed" (07/04/2014)   Tachycardia 07/04/2014   Thoracic or lumbosacral neuritis or radiculitis, unspecified    Tinea cruris 09/13/2022   Type II diabetes mellitus (HCC) DX: 2002   Urinary frequency 11/05/2014   Vaginal discharge 02/05/2021    Current Outpatient Medications on File Prior to Visit  Medication Sig Dispense Refill   Accu-Chek FastClix Lancets MISC USE TO CHECK BLOOD SUGAR TWICE DAILY 102 each 2   ACCU-CHEK GUIDE test strip USE TO CHECK BLOOD SUGAR TWICE DAILY 100 strip 2   acetaminophen (TYLENOL) 500 MG tablet Take 2 tablets (1,000 mg total) by mouth every 8 (eight) hours. 100 tablet 0   amLODipine (NORVASC) 10 MG tablet Take 1 tablet (10 mg total) by mouth daily. 90 tablet 3  ascorbic acid (VITAMIN C) 500 MG tablet Take 2 tablets (1,000 mg total) by mouth daily. 100 tablet 0   atorvastatin (LIPITOR) 40 MG tablet TAKE ONE TABLET BY MOUTH EVERY DAY 30 tablet 3   Blood Glucose Monitoring Suppl (ACCU-CHEK GUIDE) w/Device KIT Use As Directed 1 kit 0   busPIRone (BUSPAR) 10 MG tablet Take 1 tablet (10 mg total) by mouth 2 (two) times daily. 60 tablet 1   conjugated estrogens (PREMARIN) vaginal cream Place 0.5 Applicatorfuls vaginally 3 (three) times a week. 42.5 g 2   Continuous Blood Gluc Receiver (FREESTYLE LIBRE 3 READER) DEVI 1 Device by Does not apply route continuous. Monitor sugars continuously 1 each 1   Continuous Glucose  Sensor (FREESTYLE LIBRE 3 SENSOR) MISC Place new sensor every 14 days. Monitor sugars continuously. 2 each 11   cyanocobalamin 1000 MCG tablet Take 1 tablet by mouth daily.     cyclobenzaprine (FLEXERIL) 10 MG tablet TAKE ONE TABLET (10mg  total) BY MOUTH TWICE DAILY, AS NEEDED muscle SPASMS 20 tablet 0   diclofenac Sodium (VOLTAREN) 1 % GEL Apply 4 g topically 4 (four) times daily as needed (Pain). Left knee 100 g 2   Dulaglutide (TRULICITY) 3 MG/0.5ML SOPN Inject 3 mg as directed once a week. 3 mL 1   escitalopram (LEXAPRO) 20 MG tablet Take 1 tablet (20 mg total) by mouth daily. 30 tablet 1   fluticasone (CUTIVATE) 0.05 % cream Apply 1 Application topically 2 (two) times daily. 30 g 1   folic acid (FOLVITE) 1 MG tablet Take 1 mg by mouth daily.     furosemide (LASIX) 40 MG tablet Take 1 tablet (40 mg total) by mouth daily. 30 tablet 6   gabapentin (NEURONTIN) 300 MG capsule Take 1 capsule (300 mg total) by mouth 2 (two) times daily with breakfast and lunch AND 2 capsules (600 mg total) at bedtime. 120 capsule 1   hydrALAZINE (APRESOLINE) 25 MG tablet TAKE ONE TABLET BY MOUTH TWICE DAILY 180 tablet 1   insulin aspart (NOVOLOG) cartridge Inject into the skin. Use for Freestyle libre pump (Patient not taking: Reported on 11/15/2023)     Insulin Pen Needle (GNP ULTICARE PEN NEEDLES) 31G X 8 MM MISC Inject 1 each into the skin 4 (four) times daily. 200 each 0   isosorbide mononitrate (IMDUR) 60 MG 24 hr tablet Take 1.5 tablets (90 mg total) by mouth daily. 45 tablet 6   ketoconazole (NIZORAL) 2 % cream Apply topically daily. 15 g 0   LANTUS SOLOSTAR 100 UNIT/ML Solostar Pen INJECT 25 units into THE SKIN EVERY DAY 15 mL 1   levETIRAcetam (KEPPRA) 500 MG tablet TAKE ONE TABLET BY MOUTH TWICE DAILY 360 tablet 3   LINZESS 290 MCG CAPS capsule TAKE ONE CAPSULE BY MOUTH EVERY DAY AS NEEDED FOR CONSTIPATION 30 capsule 3   liver oil-zinc oxide (DESITIN) 40 % ointment Apply topically every 4 (four) hours.  (Patient taking differently: Apply 1 Application topically as needed for irritation.) 56.7 g 0   metoprolol succinate (TOPROL-XL) 50 MG 24 hr tablet TAKE ONE TABLET BY MOUTH DAILY 180 tablet 0   nystatin (MYCOSTATIN/NYSTOP) powder Apply 1 Application topically 3 (three) times daily. 15 g 0   nystatin-triamcinolone ointment (MYCOLOG) Apply 1 Application topically 2 (two) times daily. 30 g 0   oxyCODONE (ROXICODONE) 5 MG immediate release tablet Take 1 tablet (5 mg total) by mouth every 6 (six) hours as needed. 12 tablet 0   pantoprazole (PROTONIX) 40 MG tablet TAKE  ONE TABLET BY MOUTH EVERY DAY 180 tablet 3   potassium chloride SA (KLOR-CON M20) 20 MEQ tablet Take 1 tablet (20 mEq total) by mouth daily. 30 tablet 6   rivaroxaban (XARELTO) 20 MG TABS tablet Take 1 tablet (20 mg total) by mouth daily with supper. 30 tablet 11   spironolactone (ALDACTONE) 100 MG tablet TAKE ONE TABLET BY MOUTH EVERY DAY 180 tablet 3   STOOL SOFTENER/LAXATIVE 50-8.6 MG tablet TAKE ONE TABLET BY MOUTH AT BEDTIME AS NEEDED FOR MILD CONSTIPATION 30 tablet 0   talc powder Apply topically as needed. 240 g 0   traZODone (DESYREL) 100 MG tablet Take 1 tablet (100 mg total) by mouth at bedtime. 30 tablet 1   VENTOLIN HFA 108 (90 Base) MCG/ACT inhaler INHALE TWO puffs into THE lungs EVERY SIX HOURS AS NEEDED FOR wheezing OR For SHORTNESS OF BREATH 36 g 0   Vitamin D, Ergocalciferol, (DRISDOL) 1.25 MG (50000 UNIT) CAPS capsule TAKE ONE CAPSULE BY MOUTH EVERY 7 DAYS 5 capsule 5   Zinc Sulfate 220 (50 Zn) MG TABS Take 1 tablet (220 mg total) by mouth daily. 100 tablet 0   No current facility-administered medications on file prior to visit.    Family History  Problem Relation Age of Onset   Heart disease Mother 57       Died of MI at age 52 yo   Kidney disease Mother        requiring dialysis   Congestive Heart Failure Mother    Heart disease Father 11       MI age 42yo requiring stenting   Diabetes Father    Glaucoma  Father    Heart disease Paternal Grandmother        requiring pacemaker.   Heart disease Paternal Grandfather 74       Died of MI at possibly age 26-53yo   Stroke Paternal Grandfather    Diabetes Brother    Heart disease Brother 68       MI at age 19 years old   Breast cancer Paternal Aunt    Breast cancer Maternal Grandmother     Social History   Socioeconomic History   Marital status: Single    Spouse name: Not on file   Number of children: 2   Years of education: 9th grade   Highest education level: Not on file  Occupational History   Occupation: Unemployed    Comment: planning on getting disability  Tobacco Use   Smoking status: Never    Passive exposure: Current   Smokeless tobacco: Never  Vaping Use   Vaping status: Never Used  Substance and Sexual Activity   Alcohol use: No    Alcohol/week: 0.0 standard drinks of alcohol   Drug use: No   Sexual activity: Yes    Partners: Male    Birth control/protection: Surgical, Post-menopausal    Comment: menarche 58yo, sexual debut 58yo  Other Topics Concern   Not on file  Social History Narrative   Lives in [place] with her son. Is able to read and write fluently in [place].   Social Drivers of Health   Financial Resource Strain: Medium Risk (03/23/2023)   Overall Financial Resource Strain (CARDIA)    Difficulty of Paying Living Expenses: Somewhat hard  Food Insecurity: Food Insecurity Present (03/23/2023)   Hunger Vital Sign    Worried About Running Out of Food in the Last Year: Often true    Ran Out of Food in the Last Year:  Often true  Transportation Needs: Unmet Transportation Needs (03/23/2023)   PRAPARE - Transportation    Lack of Transportation (Medical): Yes    Lack of Transportation (Non-Medical): Yes  Physical Activity: Insufficiently Active (03/23/2023)   Exercise Vital Sign    Days of Exercise per Week: 3 days    Minutes of Exercise per Session: 20 min  Stress: Not on file  Social Connections:  Moderately Isolated (03/23/2023)   Social Connection and Isolation Panel [NHANES]    Frequency of Communication with Friends and Family: More than three times a week    Frequency of Social Gatherings with Friends and Family: Three times a week    Attends Religious Services: More than 4 times per year    Active Member of Clubs or Organizations: No    Attends Banker Meetings: Never    Marital Status: Never married  Intimate Partner Violence: Not At Risk (03/23/2023)   Humiliation, Afraid, Rape, and Kick questionnaire    Fear of Current or Ex-Partner: No    Emotionally Abused: No    Physically Abused: No    Sexually Abused: No    Review of Systems: ROS negative except for what is noted on the assessment and plan.  There were no vitals filed for this visit.  Physical Exam: Constitutional: well-appearing *** sitting in ***, in no acute distress HENT: normocephalic atraumatic, mucous membranes moist Eyes: conjunctiva non-erythematous Cardiovascular: regular rate and rhythm, no m/r/g Pulmonary/Chest: normal work of breathing on room air, lungs clear to auscultation bilaterally Abdominal: soft, non-tender, non-distended MSK: normal bulk and tone Neurological: alert & oriented x 3, no focal deficit Skin: warm and dry Psych: normal mood and behavior  Assessment & Plan:   Vit D deficiency - recheck   HTN: - amlodipine 10 - imdur 9-0 - spiro 100 - metop 50 - hydral 25 tid (was taking ocne daily last October)  DM: A1c 9.4 > - lantus 25u  - trulicity 3  Patient {GC/GE:[id number]::"discussed with","seen with"} Dr. {[id number]::"[id number]","Q. Hoffman","Mullen","Narendra","Vincent","Guilloud","Lau","Machen"}  No problem-specific Assessment & Plan notes found for this encounter.   Elza Rafter, D.O. [facility], PGY-3 Phone: [phone] Date 02/06/2024 Time 8:37 AM

## 2024-02-07 ENCOUNTER — Telehealth: Payer: Self-pay | Admitting: *Deleted

## 2024-02-07 ENCOUNTER — Other Ambulatory Visit: Payer: Self-pay | Admitting: Internal Medicine

## 2024-02-07 DIAGNOSIS — Z1231 Encounter for screening mammogram for malignant neoplasm of breast: Secondary | ICD-10-CM

## 2024-02-07 NOTE — Telephone Encounter (Signed)
 Mammogram appointment November 19.2025@ 4:00 pm / arrive 4:30 pm @ [facility]. Patient is aware if she canvnot keep thiss appointment to call their office t [phone]. Appointment also mailed to the patient.

## 2024-02-23 ENCOUNTER — Telehealth: Payer: Self-pay

## 2024-02-23 NOTE — Telephone Encounter (Signed)
 Prior Authorization for patient (FreeStyle Libre 3 Sensor) came through on [employer] has been faxed with last office notes and labs @ fax [phone] awaiting approval or denial.

## 2024-02-24 ENCOUNTER — Other Ambulatory Visit: Payer: Self-pay | Admitting: Internal Medicine

## 2024-02-24 DIAGNOSIS — I5022 Chronic systolic (congestive) heart failure: Secondary | ICD-10-CM

## 2024-02-24 DIAGNOSIS — I5042 Chronic combined systolic (congestive) and diastolic (congestive) heart failure: Secondary | ICD-10-CM

## 2024-02-28 ENCOUNTER — Other Ambulatory Visit: Payer: Self-pay | Admitting: Internal Medicine

## 2024-02-28 DIAGNOSIS — I5042 Chronic combined systolic (congestive) and diastolic (congestive) heart failure: Secondary | ICD-10-CM

## 2024-02-28 DIAGNOSIS — E1169 Type 2 diabetes mellitus with other specified complication: Secondary | ICD-10-CM

## 2024-02-28 DIAGNOSIS — I5022 Chronic systolic (congestive) heart failure: Secondary | ICD-10-CM

## 2024-02-28 NOTE — Telephone Encounter (Signed)
 Last Fill: Imdur: 07/27/23     Furosemide: 07/27/23     Folic Acid: Unknown  Last OV: 11/29/23 Next OV: None Scheduled  Routing to provider for review/authorization.

## 2024-02-28 NOTE — Telephone Encounter (Unsigned)
 Copied from CRM [id number]. Topic: Clinical - Medication Refill >> Feb 28, 2024  4:34 PM Antony Haste wrote: Most Recent Primary Care Visit:  Provider: Jeral Pinch  Department: [facility]  Visit Type: TELEPHONE OFFICE VISIT  Date: 11/29/2023  Medication: isosorbide mononitrate (IMDUR) 60 MG 24 hr tablet  furosemide (LASIX) 40 MG tablet folic acid (FOLVITE) 1 MG tablet   Has the patient contacted their pharmacy? Yes (Agent: If no, request that the patient contact the pharmacy for the refill. If patient does not wish to contact the pharmacy document the reason why and proceed with request.) (Agent: If yes, when and what did the pharmacy advise?)  Is this the correct pharmacy for this prescription? Yes If no, delete pharmacy and type the correct one.  This is the patient's preferred pharmacy:  [facility] - [place], [place] - [address] [place] [address] Phone: [phone] Fax: [phone]   Has the prescription been filled recently? No  Is the patient out of the medication? Yes  Has the patient been seen for an appointment in the last year OR does the patient have an upcoming appointment? Yes  Can we respond through MyChart? Yes  Agent: Please be advised that Rx refills may take up to 3 business days. We ask that you follow-up with your pharmacy.

## 2024-03-01 ENCOUNTER — Telehealth: Payer: Self-pay | Admitting: Internal Medicine

## 2024-03-01 NOTE — Telephone Encounter (Signed)
 Copied from CRM [id number]. Topic: General - Other >> Feb 29, 2024  5:48 PM Corin V wrote: Reason for CRM: Gerilyn Pilgrim with [employer] [employer] called to follow up on paperwork that was faxed over on 02/22/24 for incontinence supplies. Please give him a call back regarding if paperwork has been received or not at [phone].  Form has been Received awaiting Dr.'s Signature

## 2024-03-01 NOTE — Telephone Encounter (Signed)
 Filled on 3/19

## 2024-03-02 ENCOUNTER — Inpatient Hospital Stay (HOSPITAL_COMMUNITY)

## 2024-03-02 ENCOUNTER — Encounter (HOSPITAL_COMMUNITY): Payer: Self-pay

## 2024-03-02 ENCOUNTER — Inpatient Hospital Stay (HOSPITAL_COMMUNITY)
Admission: EM | Admit: 2024-03-02 | Discharge: 2024-04-13 | DRG: 085 | Disposition: A | Discharge status: Skilled Nursing Facility | Attending: Internal Medicine | Admitting: Internal Medicine

## 2024-03-02 ENCOUNTER — Other Ambulatory Visit: Payer: Self-pay

## 2024-03-02 ENCOUNTER — Emergency Department (HOSPITAL_COMMUNITY)

## 2024-03-02 DIAGNOSIS — Z888 Allergy status to other drugs, medicaments and biological substances status: Secondary | ICD-10-CM

## 2024-03-02 DIAGNOSIS — L89152 Pressure ulcer of sacral region, stage 2: Secondary | ICD-10-CM | POA: Diagnosis not present

## 2024-03-02 DIAGNOSIS — R569 Unspecified convulsions: Secondary | ICD-10-CM | POA: Diagnosis not present

## 2024-03-02 DIAGNOSIS — E871 Hypo-osmolality and hyponatremia: Secondary | ICD-10-CM | POA: Diagnosis not present

## 2024-03-02 DIAGNOSIS — Z515 Encounter for palliative care: Secondary | ICD-10-CM

## 2024-03-02 DIAGNOSIS — G43909 Migraine, unspecified, not intractable, without status migrainosus: Secondary | ICD-10-CM | POA: Diagnosis not present

## 2024-03-02 DIAGNOSIS — Z789 Other specified health status: Secondary | ICD-10-CM | POA: Diagnosis not present

## 2024-03-02 DIAGNOSIS — Z6837 Body mass index (BMI) 37.0-37.9, adult: Secondary | ICD-10-CM

## 2024-03-02 DIAGNOSIS — R561 Post traumatic seizures: Secondary | ICD-10-CM | POA: Diagnosis not present

## 2024-03-02 DIAGNOSIS — R739 Hyperglycemia, unspecified: Secondary | ICD-10-CM | POA: Diagnosis not present

## 2024-03-02 DIAGNOSIS — I5020 Unspecified systolic (congestive) heart failure: Secondary | ICD-10-CM | POA: Diagnosis not present

## 2024-03-02 DIAGNOSIS — F4310 Post-traumatic stress disorder, unspecified: Secondary | ICD-10-CM | POA: Diagnosis present

## 2024-03-02 DIAGNOSIS — F32A Depression, unspecified: Secondary | ICD-10-CM | POA: Diagnosis present

## 2024-03-02 DIAGNOSIS — A4151 Sepsis due to Escherichia coli [E. coli]: Secondary | ICD-10-CM | POA: Diagnosis not present

## 2024-03-02 DIAGNOSIS — R5381 Other malaise: Secondary | ICD-10-CM | POA: Diagnosis not present

## 2024-03-02 DIAGNOSIS — S0990XA Unspecified injury of head, initial encounter: Secondary | ICD-10-CM | POA: Diagnosis not present

## 2024-03-02 DIAGNOSIS — R41841 Cognitive communication deficit: Secondary | ICD-10-CM | POA: Diagnosis not present

## 2024-03-02 DIAGNOSIS — B3789 Other sites of candidiasis: Secondary | ICD-10-CM | POA: Diagnosis not present

## 2024-03-02 DIAGNOSIS — E46 Unspecified protein-calorie malnutrition: Secondary | ICD-10-CM | POA: Diagnosis present

## 2024-03-02 DIAGNOSIS — I447 Left bundle-branch block, unspecified: Secondary | ICD-10-CM | POA: Diagnosis present

## 2024-03-02 DIAGNOSIS — I4891 Unspecified atrial fibrillation: Secondary | ICD-10-CM | POA: Diagnosis not present

## 2024-03-02 DIAGNOSIS — L89302 Pressure ulcer of unspecified buttock, stage 2: Secondary | ICD-10-CM | POA: Insufficient documentation

## 2024-03-02 DIAGNOSIS — I6200 Nontraumatic subdural hemorrhage, unspecified: Secondary | ICD-10-CM | POA: Diagnosis not present

## 2024-03-02 DIAGNOSIS — S065X0A Traumatic subdural hemorrhage without loss of consciousness, initial encounter: Secondary | ICD-10-CM | POA: Diagnosis present

## 2024-03-02 DIAGNOSIS — Z884 Allergy status to anesthetic agent status: Secondary | ICD-10-CM

## 2024-03-02 DIAGNOSIS — S069XAS Unspecified intracranial injury with loss of consciousness status unknown, sequela: Secondary | ICD-10-CM

## 2024-03-02 DIAGNOSIS — G40509 Epileptic seizures related to external causes, not intractable, without status epilepticus: Secondary | ICD-10-CM | POA: Diagnosis present

## 2024-03-02 DIAGNOSIS — K5900 Constipation, unspecified: Secondary | ICD-10-CM | POA: Diagnosis present

## 2024-03-02 DIAGNOSIS — E7220 Disorder of urea cycle metabolism, unspecified: Secondary | ICD-10-CM | POA: Diagnosis not present

## 2024-03-02 DIAGNOSIS — B370 Candidal stomatitis: Secondary | ICD-10-CM | POA: Diagnosis not present

## 2024-03-02 DIAGNOSIS — E119 Type 2 diabetes mellitus without complications: Secondary | ICD-10-CM | POA: Diagnosis not present

## 2024-03-02 DIAGNOSIS — J45909 Unspecified asthma, uncomplicated: Secondary | ICD-10-CM | POA: Diagnosis present

## 2024-03-02 DIAGNOSIS — J9601 Acute respiratory failure with hypoxia: Secondary | ICD-10-CM | POA: Diagnosis not present

## 2024-03-02 DIAGNOSIS — F0781 Postconcussional syndrome: Secondary | ICD-10-CM | POA: Diagnosis not present

## 2024-03-02 DIAGNOSIS — E785 Hyperlipidemia, unspecified: Secondary | ICD-10-CM | POA: Diagnosis not present

## 2024-03-02 DIAGNOSIS — G928 Other toxic encephalopathy: Secondary | ICD-10-CM | POA: Diagnosis not present

## 2024-03-02 DIAGNOSIS — D649 Anemia, unspecified: Secondary | ICD-10-CM | POA: Diagnosis present

## 2024-03-02 DIAGNOSIS — S065XAA Traumatic subdural hemorrhage with loss of consciousness status unknown, initial encounter: Secondary | ICD-10-CM | POA: Diagnosis not present

## 2024-03-02 DIAGNOSIS — Z8619 Personal history of other infectious and parasitic diseases: Secondary | ICD-10-CM | POA: Diagnosis not present

## 2024-03-02 DIAGNOSIS — S069XAA Unspecified intracranial injury with loss of consciousness status unknown, initial encounter: Secondary | ICD-10-CM | POA: Diagnosis present

## 2024-03-02 DIAGNOSIS — R519 Headache, unspecified: Secondary | ICD-10-CM | POA: Diagnosis not present

## 2024-03-02 DIAGNOSIS — Z8249 Family history of ischemic heart disease and other diseases of the circulatory system: Secondary | ICD-10-CM

## 2024-03-02 DIAGNOSIS — R1312 Dysphagia, oropharyngeal phase: Secondary | ICD-10-CM | POA: Diagnosis present

## 2024-03-02 DIAGNOSIS — Z1152 Encounter for screening for COVID-19: Secondary | ICD-10-CM | POA: Diagnosis not present

## 2024-03-02 DIAGNOSIS — Z95810 Presence of automatic (implantable) cardiac defibrillator: Secondary | ICD-10-CM | POA: Diagnosis not present

## 2024-03-02 DIAGNOSIS — G40101 Localization-related (focal) (partial) symptomatic epilepsy and epileptic syndromes with simple partial seizures, not intractable, with status epilepticus: Secondary | ICD-10-CM | POA: Diagnosis present

## 2024-03-02 DIAGNOSIS — E11649 Type 2 diabetes mellitus with hypoglycemia without coma: Secondary | ICD-10-CM | POA: Diagnosis present

## 2024-03-02 DIAGNOSIS — M6281 Muscle weakness (generalized): Secondary | ICD-10-CM | POA: Diagnosis not present

## 2024-03-02 DIAGNOSIS — Z841 Family history of disorders of kidney and ureter: Secondary | ICD-10-CM

## 2024-03-02 DIAGNOSIS — E1165 Type 2 diabetes mellitus with hyperglycemia: Secondary | ICD-10-CM | POA: Diagnosis not present

## 2024-03-02 DIAGNOSIS — S06350A Traumatic hemorrhage of left cerebrum without loss of consciousness, initial encounter: Secondary | ICD-10-CM | POA: Diagnosis present

## 2024-03-02 DIAGNOSIS — I10 Essential (primary) hypertension: Secondary | ICD-10-CM | POA: Diagnosis present

## 2024-03-02 DIAGNOSIS — M869 Osteomyelitis, unspecified: Secondary | ICD-10-CM | POA: Diagnosis not present

## 2024-03-02 DIAGNOSIS — S069X9A Unspecified intracranial injury with loss of consciousness of unspecified duration, initial encounter: Secondary | ICD-10-CM | POA: Diagnosis not present

## 2024-03-02 DIAGNOSIS — I82611 Acute embolism and thrombosis of superficial veins of right upper extremity: Secondary | ICD-10-CM | POA: Diagnosis not present

## 2024-03-02 DIAGNOSIS — K143 Hypertrophy of tongue papillae: Secondary | ICD-10-CM | POA: Diagnosis present

## 2024-03-02 DIAGNOSIS — J15212 Pneumonia due to Methicillin resistant Staphylococcus aureus: Secondary | ICD-10-CM | POA: Diagnosis not present

## 2024-03-02 DIAGNOSIS — K219 Gastro-esophageal reflux disease without esophagitis: Secondary | ICD-10-CM | POA: Diagnosis not present

## 2024-03-02 DIAGNOSIS — Z833 Family history of diabetes mellitus: Secondary | ICD-10-CM

## 2024-03-02 DIAGNOSIS — J90 Pleural effusion, not elsewhere classified: Secondary | ICD-10-CM | POA: Diagnosis not present

## 2024-03-02 DIAGNOSIS — B9629 Other Escherichia coli [E. coli] as the cause of diseases classified elsewhere: Secondary | ICD-10-CM | POA: Diagnosis not present

## 2024-03-02 DIAGNOSIS — Z794 Long term (current) use of insulin: Secondary | ICD-10-CM | POA: Diagnosis not present

## 2024-03-02 DIAGNOSIS — Z79899 Other long term (current) drug therapy: Secondary | ICD-10-CM

## 2024-03-02 DIAGNOSIS — I5042 Chronic combined systolic (congestive) and diastolic (congestive) heart failure: Secondary | ICD-10-CM | POA: Diagnosis not present

## 2024-03-02 DIAGNOSIS — I480 Paroxysmal atrial fibrillation: Secondary | ICD-10-CM | POA: Diagnosis not present

## 2024-03-02 DIAGNOSIS — G8929 Other chronic pain: Secondary | ICD-10-CM | POA: Diagnosis present

## 2024-03-02 DIAGNOSIS — Z87892 Personal history of anaphylaxis: Secondary | ICD-10-CM

## 2024-03-02 DIAGNOSIS — J95851 Ventilator associated pneumonia: Secondary | ICD-10-CM | POA: Diagnosis not present

## 2024-03-02 DIAGNOSIS — Z950 Presence of cardiac pacemaker: Secondary | ICD-10-CM | POA: Diagnosis not present

## 2024-03-02 DIAGNOSIS — Z8614 Personal history of Methicillin resistant Staphylococcus aureus infection: Secondary | ICD-10-CM | POA: Diagnosis not present

## 2024-03-02 DIAGNOSIS — R6521 Severe sepsis with septic shock: Secondary | ICD-10-CM | POA: Diagnosis not present

## 2024-03-02 DIAGNOSIS — S061X0A Traumatic cerebral edema without loss of consciousness, initial encounter: Secondary | ICD-10-CM | POA: Diagnosis present

## 2024-03-02 DIAGNOSIS — E1140 Type 2 diabetes mellitus with diabetic neuropathy, unspecified: Secondary | ICD-10-CM | POA: Diagnosis not present

## 2024-03-02 DIAGNOSIS — G9389 Other specified disorders of brain: Secondary | ICD-10-CM | POA: Diagnosis not present

## 2024-03-02 DIAGNOSIS — J9811 Atelectasis: Secondary | ICD-10-CM | POA: Diagnosis not present

## 2024-03-02 DIAGNOSIS — I517 Cardiomegaly: Secondary | ICD-10-CM | POA: Diagnosis not present

## 2024-03-02 DIAGNOSIS — I69351 Hemiplegia and hemiparesis following cerebral infarction affecting right dominant side: Secondary | ICD-10-CM | POA: Diagnosis not present

## 2024-03-02 DIAGNOSIS — J9600 Acute respiratory failure, unspecified whether with hypoxia or hypercapnia: Secondary | ICD-10-CM | POA: Diagnosis not present

## 2024-03-02 DIAGNOSIS — Z803 Family history of malignant neoplasm of breast: Secondary | ICD-10-CM

## 2024-03-02 DIAGNOSIS — Z87440 Personal history of urinary (tract) infections: Secondary | ICD-10-CM

## 2024-03-02 DIAGNOSIS — Z66 Do not resuscitate: Secondary | ICD-10-CM | POA: Diagnosis present

## 2024-03-02 DIAGNOSIS — Z8673 Personal history of transient ischemic attack (TIA), and cerebral infarction without residual deficits: Secondary | ICD-10-CM | POA: Diagnosis not present

## 2024-03-02 DIAGNOSIS — R1110 Vomiting, unspecified: Secondary | ICD-10-CM | POA: Diagnosis not present

## 2024-03-02 DIAGNOSIS — Z7901 Long term (current) use of anticoagulants: Secondary | ICD-10-CM

## 2024-03-02 DIAGNOSIS — I428 Other cardiomyopathies: Secondary | ICD-10-CM | POA: Diagnosis present

## 2024-03-02 DIAGNOSIS — R4182 Altered mental status, unspecified: Secondary | ICD-10-CM | POA: Diagnosis not present

## 2024-03-02 DIAGNOSIS — R69 Illness, unspecified: Secondary | ICD-10-CM | POA: Diagnosis not present

## 2024-03-02 DIAGNOSIS — Y95 Nosocomial condition: Secondary | ICD-10-CM | POA: Diagnosis present

## 2024-03-02 DIAGNOSIS — R627 Adult failure to thrive: Secondary | ICD-10-CM | POA: Diagnosis present

## 2024-03-02 DIAGNOSIS — R918 Other nonspecific abnormal finding of lung field: Secondary | ICD-10-CM | POA: Diagnosis not present

## 2024-03-02 DIAGNOSIS — Z83511 Family history of glaucoma: Secondary | ICD-10-CM

## 2024-03-02 DIAGNOSIS — G936 Cerebral edema: Secondary | ICD-10-CM | POA: Diagnosis not present

## 2024-03-02 DIAGNOSIS — Y848 Other medical procedures as the cause of abnormal reaction of the patient, or of later complication, without mention of misadventure at the time of the procedure: Secondary | ICD-10-CM | POA: Diagnosis not present

## 2024-03-02 DIAGNOSIS — R0989 Other specified symptoms and signs involving the circulatory and respiratory systems: Secondary | ICD-10-CM | POA: Diagnosis not present

## 2024-03-02 DIAGNOSIS — I69398 Other sequelae of cerebral infarction: Secondary | ICD-10-CM | POA: Diagnosis not present

## 2024-03-02 DIAGNOSIS — M436 Torticollis: Secondary | ICD-10-CM | POA: Diagnosis present

## 2024-03-02 DIAGNOSIS — S0634AA Traumatic hemorrhage of right cerebrum with loss of consciousness status unknown, initial encounter: Secondary | ICD-10-CM | POA: Diagnosis not present

## 2024-03-02 DIAGNOSIS — R531 Weakness: Secondary | ICD-10-CM | POA: Diagnosis not present

## 2024-03-02 DIAGNOSIS — R112 Nausea with vomiting, unspecified: Secondary | ICD-10-CM | POA: Diagnosis not present

## 2024-03-02 DIAGNOSIS — Z9181 History of falling: Secondary | ICD-10-CM | POA: Diagnosis not present

## 2024-03-02 DIAGNOSIS — Z882 Allergy status to sulfonamides status: Secondary | ICD-10-CM

## 2024-03-02 DIAGNOSIS — Y29XXXA Contact with blunt object, undetermined intent, initial encounter: Secondary | ICD-10-CM | POA: Diagnosis not present

## 2024-03-02 DIAGNOSIS — Z4682 Encounter for fitting and adjustment of non-vascular catheter: Secondary | ICD-10-CM | POA: Diagnosis not present

## 2024-03-02 DIAGNOSIS — I69354 Hemiplegia and hemiparesis following cerebral infarction affecting left non-dominant side: Secondary | ICD-10-CM

## 2024-03-02 DIAGNOSIS — N390 Urinary tract infection, site not specified: Secondary | ICD-10-CM | POA: Diagnosis not present

## 2024-03-02 DIAGNOSIS — A412 Sepsis due to unspecified staphylococcus: Secondary | ICD-10-CM | POA: Diagnosis not present

## 2024-03-02 DIAGNOSIS — G40901 Epilepsy, unspecified, not intractable, with status epilepticus: Secondary | ICD-10-CM | POA: Diagnosis not present

## 2024-03-02 DIAGNOSIS — D72829 Elevated white blood cell count, unspecified: Secondary | ICD-10-CM | POA: Diagnosis not present

## 2024-03-02 DIAGNOSIS — G9340 Encephalopathy, unspecified: Secondary | ICD-10-CM | POA: Diagnosis not present

## 2024-03-02 DIAGNOSIS — I6782 Cerebral ischemia: Secondary | ICD-10-CM | POA: Diagnosis not present

## 2024-03-02 DIAGNOSIS — R0602 Shortness of breath: Secondary | ICD-10-CM | POA: Diagnosis not present

## 2024-03-02 DIAGNOSIS — F05 Delirium due to known physiological condition: Secondary | ICD-10-CM | POA: Diagnosis not present

## 2024-03-02 DIAGNOSIS — R4189 Other symptoms and signs involving cognitive functions and awareness: Secondary | ICD-10-CM | POA: Insufficient documentation

## 2024-03-02 DIAGNOSIS — F419 Anxiety disorder, unspecified: Secondary | ICD-10-CM | POA: Diagnosis present

## 2024-03-02 DIAGNOSIS — I619 Nontraumatic intracerebral hemorrhage, unspecified: Secondary | ICD-10-CM | POA: Diagnosis not present

## 2024-03-02 DIAGNOSIS — Z881 Allergy status to other antibiotic agents status: Secondary | ICD-10-CM

## 2024-03-02 DIAGNOSIS — G4733 Obstructive sleep apnea (adult) (pediatric): Secondary | ICD-10-CM | POA: Diagnosis present

## 2024-03-02 DIAGNOSIS — Z823 Family history of stroke: Secondary | ICD-10-CM

## 2024-03-02 DIAGNOSIS — E1142 Type 2 diabetes mellitus with diabetic polyneuropathy: Secondary | ICD-10-CM | POA: Diagnosis present

## 2024-03-02 DIAGNOSIS — E222 Syndrome of inappropriate secretion of antidiuretic hormone: Secondary | ICD-10-CM | POA: Diagnosis not present

## 2024-03-02 DIAGNOSIS — R509 Fever, unspecified: Secondary | ICD-10-CM | POA: Diagnosis not present

## 2024-03-02 DIAGNOSIS — I110 Hypertensive heart disease with heart failure: Secondary | ICD-10-CM | POA: Diagnosis not present

## 2024-03-02 DIAGNOSIS — M7989 Other specified soft tissue disorders: Secondary | ICD-10-CM | POA: Diagnosis not present

## 2024-03-02 DIAGNOSIS — G40909 Epilepsy, unspecified, not intractable, without status epilepticus: Secondary | ICD-10-CM

## 2024-03-02 DIAGNOSIS — Z7189 Other specified counseling: Secondary | ICD-10-CM | POA: Diagnosis not present

## 2024-03-02 DIAGNOSIS — I629 Nontraumatic intracranial hemorrhage, unspecified: Secondary | ICD-10-CM | POA: Diagnosis not present

## 2024-03-02 DIAGNOSIS — R059 Cough, unspecified: Secondary | ICD-10-CM | POA: Diagnosis not present

## 2024-03-02 DIAGNOSIS — R491 Aphonia: Secondary | ICD-10-CM | POA: Diagnosis present

## 2024-03-02 DIAGNOSIS — Z7401 Bed confinement status: Secondary | ICD-10-CM | POA: Diagnosis not present

## 2024-03-02 DIAGNOSIS — T4275XA Adverse effect of unspecified antiepileptic and sedative-hypnotic drugs, initial encounter: Secondary | ICD-10-CM | POA: Diagnosis not present

## 2024-03-02 LAB — TYPE AND SCREEN
ABO/RH(D): O POS
Antibody Screen: NEGATIVE

## 2024-03-02 LAB — CBC WITH DIFFERENTIAL/PLATELET
Abs Immature Granulocytes: 0.15 10*3/uL — ABNORMAL HIGH (ref 0.00–0.07)
Basophils Absolute: 0 10*3/uL (ref 0.0–0.1)
Basophils Relative: 0 %
Eosinophils Absolute: 0 10*3/uL (ref 0.0–0.5)
Eosinophils Relative: 0 %
HCT: 40.8 % (ref 36.0–46.0)
Hemoglobin: 13.3 g/dL (ref 12.0–15.0)
Immature Granulocytes: 1 %
Lymphocytes Relative: 6 %
Lymphs Abs: 1.2 10*3/uL (ref 0.7–4.0)
MCH: 27.5 pg (ref 26.0–34.0)
MCHC: 32.6 g/dL (ref 30.0–36.0)
MCV: 84.5 fL (ref 80.0–100.0)
Monocytes Absolute: 1.1 10*3/uL — ABNORMAL HIGH (ref 0.1–1.0)
Monocytes Relative: 6 %
Neutro Abs: 15.8 10*3/uL — ABNORMAL HIGH (ref 1.7–7.7)
Neutrophils Relative %: 87 %
Platelets: 184 10*3/uL (ref 150–400)
RBC: 4.83 MIL/uL (ref 3.87–5.11)
RDW: 12.2 % (ref 11.5–15.5)
WBC: 18.3 10*3/uL — ABNORMAL HIGH (ref 4.0–10.5)
nRBC: 0 % (ref 0.0–0.2)

## 2024-03-02 LAB — COMPREHENSIVE METABOLIC PANEL
ALT: 14 U/L (ref 0–44)
AST: 17 U/L (ref 15–41)
Albumin: 4 g/dL (ref 3.5–5.0)
Alkaline Phosphatase: 72 U/L (ref 38–126)
Anion gap: 12 (ref 5–15)
BUN: 7 mg/dL (ref 6–20)
CO2: 24 mmol/L (ref 22–32)
Calcium: 9 mg/dL (ref 8.9–10.3)
Chloride: 99 mmol/L (ref 98–111)
Creatinine, Ser: 0.86 mg/dL (ref 0.44–1.00)
GFR, Estimated: >60 mL/min (ref >60)
Glucose, Bld: 239 mg/dL — ABNORMAL HIGH (ref 70–99)
Potassium: 3.4 mmol/L — ABNORMAL LOW (ref 3.5–5.1)
Sodium: 135 mmol/L (ref 135–145)
Total Bilirubin: 1.3 mg/dL — ABNORMAL HIGH (ref 0.0–1.2)
Total Protein: 8 g/dL (ref 6.5–8.1)

## 2024-03-02 LAB — URINALYSIS, MICROSCOPIC (REFLEX)

## 2024-03-02 LAB — GLUCOSE, CAPILLARY
Glucose-Capillary: 197 mg/dL — ABNORMAL HIGH (ref 70–99)
Glucose-Capillary: 244 mg/dL — ABNORMAL HIGH (ref 70–99)

## 2024-03-02 LAB — PROTIME-INR
INR: 1.2 (ref 0.8–1.2)
Prothrombin Time: 15.8 s — ABNORMAL HIGH (ref 11.4–15.2)

## 2024-03-02 LAB — URINALYSIS, ROUTINE W REFLEX MICROSCOPIC
Bilirubin Urine: NEGATIVE
Glucose, UA: >=500 mg/dL — AB
Ketones, ur: NEGATIVE mg/dL
Nitrite: NEGATIVE
Protein, ur: NEGATIVE mg/dL
Specific Gravity, Urine: 1.02 (ref 1.005–1.030)
pH: 6 (ref 5.0–8.0)

## 2024-03-02 LAB — MAGNESIUM: Magnesium: 1.4 mg/dL — ABNORMAL LOW (ref 1.7–2.4)

## 2024-03-02 LAB — HEMOGLOBIN A1C
Hgb A1c MFr Bld: 11.3 % — ABNORMAL HIGH (ref 4.8–5.6)
Mean Plasma Glucose: 277.61 mg/dL

## 2024-03-02 LAB — RESP PANEL BY RT-PCR (RSV, FLU A&B, COVID)  RVPGX2
Influenza A by PCR: NEGATIVE
Influenza B by PCR: NEGATIVE
Resp Syncytial Virus by PCR: NEGATIVE
SARS Coronavirus 2 by RT PCR: NEGATIVE

## 2024-03-02 LAB — ABO/RH: ABO/RH(D): O POS

## 2024-03-02 LAB — HIV ANTIBODY (ROUTINE TESTING W REFLEX): HIV Screen 4th Generation wRfx: NONREACTIVE

## 2024-03-02 LAB — LIPASE, BLOOD: Lipase: 19 U/L (ref 11–51)

## 2024-03-02 LAB — APTT: aPTT: 32 s (ref 24–36)

## 2024-03-02 MED ORDER — EMPTY CONTAINERS FLEXIBLE MISC
900.0000 mg | Freq: Once | Status: AC
  Administered 2024-03-02: 900 mg via INTRAVENOUS
  Filled 2024-03-02: qty 90

## 2024-03-02 MED ORDER — HYDRALAZINE HCL 20 MG/ML IJ SOLN
10.0000 mg | INTRAMUSCULAR | Status: DC | PRN
Start: 2024-03-02 — End: 2024-03-03

## 2024-03-02 MED ORDER — INSULIN ASPART 100 UNIT/ML IJ SOLN
0.0000 [IU] | INTRAMUSCULAR | Status: DC
  Administered 2024-03-02: 5 [IU] via SUBCUTANEOUS
  Administered 2024-03-02 – 2024-03-03 (×2): 3 [IU] via SUBCUTANEOUS
  Administered 2024-03-03 (×5): 5 [IU] via SUBCUTANEOUS
  Administered 2024-03-03 – 2024-03-04 (×4): 3 [IU] via SUBCUTANEOUS
  Administered 2024-03-04: 5 [IU] via SUBCUTANEOUS
  Administered 2024-03-04: 3 [IU] via SUBCUTANEOUS
  Administered 2024-03-04: 5 [IU] via SUBCUTANEOUS
  Administered 2024-03-05: 8 [IU] via SUBCUTANEOUS
  Administered 2024-03-05 (×2): 3 [IU] via SUBCUTANEOUS

## 2024-03-02 MED ORDER — HYDROMORPHONE HCL 1 MG/ML IJ SOLN
0.5000 mg | INTRAMUSCULAR | Status: DC | PRN
  Administered 2024-03-02 – 2024-03-03 (×6): 0.5 mg via INTRAVENOUS
  Filled 2024-03-02 (×5): qty 0.5
  Filled 2024-03-02: qty 1

## 2024-03-02 MED ORDER — POLYETHYLENE GLYCOL 3350 17 G PO PACK: 17.0000 g | PACK | Freq: Every day | ORAL | Status: DC | PRN

## 2024-03-02 MED ORDER — ONDANSETRON HCL 4 MG/2ML IJ SOLN
4.0000 mg | Freq: Once | INTRAMUSCULAR | Status: AC
  Administered 2024-03-02: 4 mg via INTRAVENOUS
  Filled 2024-03-02: qty 2

## 2024-03-02 MED ORDER — ACETAMINOPHEN 325 MG PO TABS
650.0000 mg | ORAL_TABLET | Freq: Four times a day (QID) | ORAL | Status: DC | PRN
  Administered 2024-03-03: 650 mg via ORAL
  Filled 2024-03-02: qty 2

## 2024-03-02 MED ORDER — ORAL CARE MOUTH RINSE
15.0000 mL | OROMUCOSAL | Status: DC | PRN
  Administered 2024-03-02 – 2024-03-03 (×2): 15 mL via OROMUCOSAL

## 2024-03-02 MED ORDER — HYDROMORPHONE HCL 1 MG/ML IJ SOLN
0.5000 mg | Freq: Once | INTRAMUSCULAR | Status: AC
  Administered 2024-03-02: 0.5 mg via INTRAVENOUS
  Filled 2024-03-02: qty 1

## 2024-03-02 MED ORDER — PANTOPRAZOLE SODIUM 40 MG IV SOLR
40.0000 mg | Freq: Every day | INTRAVENOUS | Status: DC
  Administered 2024-03-02 – 2024-03-03 (×2): 40 mg via INTRAVENOUS
  Filled 2024-03-02 (×2): qty 10

## 2024-03-02 MED ORDER — POTASSIUM CHLORIDE CRYS ER 20 MEQ PO TBCR: 40.0000 meq | EXTENDED_RELEASE_TABLET | Freq: Once | ORAL | Status: DC

## 2024-03-02 MED ORDER — POTASSIUM CHLORIDE 10 MEQ/100ML IV SOLN
10.0000 meq | INTRAVENOUS | Status: AC
  Administered 2024-03-02 (×4): 10 meq via INTRAVENOUS
  Filled 2024-03-02 (×4): qty 100

## 2024-03-02 MED ORDER — LEVETIRACETAM IN NACL 500 MG/100ML IV SOLN
500.0000 mg | Freq: Two times a day (BID) | INTRAVENOUS | Status: DC
  Administered 2024-03-02 – 2024-03-03 (×3): 500 mg via INTRAVENOUS
  Filled 2024-03-02 (×3): qty 100

## 2024-03-02 MED ORDER — CLEVIDIPINE BUTYRATE 0.5 MG/ML IV EMUL
0.0000 mg/h | INTRAVENOUS | Status: DC
  Administered 2024-03-02: 8 mg/h via INTRAVENOUS
  Administered 2024-03-02: 2 mg/h via INTRAVENOUS
  Administered 2024-03-02: 8 mg/h via INTRAVENOUS
  Administered 2024-03-03 (×2): 4 mg/h via INTRAVENOUS
  Administered 2024-03-03: 6 mg/h via INTRAVENOUS
  Administered 2024-03-04: 4 mg/h via INTRAVENOUS
  Administered 2024-03-04: 3 mg/h via INTRAVENOUS
  Filled 2024-03-02 (×7): qty 100

## 2024-03-02 MED ORDER — LACTATED RINGERS IV SOLN: INTRAVENOUS | Status: DC

## 2024-03-02 MED ORDER — CHLORHEXIDINE GLUCONATE CLOTH 2 % EX PADS
6.0000 | MEDICATED_PAD | Freq: Every day | CUTANEOUS | Status: DC
  Administered 2024-03-02 – 2024-03-05 (×4): 6 via TOPICAL

## 2024-03-02 NOTE — ED Triage Notes (Signed)
 Pt arrived via [facility] from home for c/o HA, N/V started last night. Pt states took advil last night, but it didn't help. 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 Pt denies vision changes.
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
## 2024-03-02 NOTE — ED Notes (Signed)
 ED TO INPATIENT HANDOFF REPORT  ED Nurse Name and Phone #: Cheral Bay Name/Age/Gender [name] 58 y.o. female Room/Bed: 025C/025C  Code Status   Code Status: Full Code  Home/SNF/Other Home Patient oriented to: self, place, time, and situation Is this baseline? Yes   Triage Complete: Triage complete  Chief Complaint Subdural hematoma (HCC) [S06.5XAA]  Triage Note Pt arrived via [facility] from home for c/o HA, N/V started last night. Pt states took advil last night, but it didn't help. 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 Pt denies vision changes.   Allergies Allergies  Allergen Reactions   Ace Inhibitors Anaphylaxis and Other (See Comments)    Swelling of the tongue and throat    Cleocin [Clindamycin] Anaphylaxis and Swelling   Enalapril Maleate Anaphylaxis and Other (See Comments)    Swelling of the tongue and throat    Zestril [Lisinopril] Anaphylaxis and Other (See Comments)    Swelling of the tongue and throat     Level of Care/Admitting Diagnosis ED Disposition     ED Disposition  Admit   Condition  --   Comment  Hospital Area: [facility] [[id number]]  Level of Care: ICU [6]  May admit patient to [facility] or [facility] if equivalent level of care is available:: No  Covid Evaluation: Confirmed COVID Negative  Diagnosis: Subdural hematoma [facility]) [[id number]]  Admitting Physician: [name] [[id number]]  Attending Physician: [name] [[id number]]  Bed request comments: 4N if avail  Certification:: I certify this patient will need inpatient services for at least 2 midnights  Expected Medical Readiness: 03/05/2024          B Medical/Surgery History Past Medical History:  Diagnosis Date   Acute cystitis 06/19/2021   AKI (acute kidney injury) (HCC) 09/13/2022   AKI (acute kidney injury) (HCC) 09/13/2022   Allergic rhinitis    Arthritis    "hips, back, legs, arms" (07/04/2014)   Asthma    hx   Automatic implantable  cardioverter-defibrillator in situ    Calcifying tendinitis of shoulder    Chronic combined systolic and diastolic CHF (congestive heart failure) (HCC)    EF 40-45% by echo 12/06/2012   Chronic diastolic heart failure (HCC)     Primarily diastolic CHF: Likely due to uncontrolled HTN. Last echo (8/12) with EF 45-50%, mild to moderate LVH with some asymmetric septal hypertrophy, RV normal size and systolic function. EF 50-55% by LV-gram in 6/12.    Chronic lower back pain    secondary to DJD, obsetiy, hip problems. Followed by Dr. Ivory Broad (pain management)   Chronic systolic heart failure (HCC) 05/17/2022   Chronic use of opiate for therapeutic purpose 12/14/2016   Contact with and (suspected) exposure to covid-19 02/07/2020   Coronary artery disease    questionable. LHC 05/2011 showing normal coronaries // Followed at [facility], Dr. Shirlee Latch   Cough 11/02/2022   Degeneration of lumbar or lumbosacral intervertebral disc    Diabetic foot infection (HCC) 03/05/2023   DJD (degenerative joint disease) of hip    right sided   Fatigue 04/27/2022   Frequent UTI    GERD (gastroesophageal reflux disease)    HLD (hyperlipidemia)    Hypertension    Poorly controlled. Has had HTN since age 76. Angioedema with ACEI.  24 Hr urine and renal arterial dopplers ordered . . . Never done   LBBB (left bundle branch block)    Left shoulder pain 06/30/2015   Left spastic hemiparesis (  HCC) 09/23/2015   Liver disease    Lumbago 11/22/2016   Morbid obesity (HCC)    Muscle spasm 06/19/2021   Need for pneumococcal vaccine 09/09/2017   NICM (nonischemic cardiomyopathy) (HCC)    EF 45-50% in 8/12, cath 6/12 showed normal coronaries, EF 50-55% by LV gram   OSA on CPAP    sleep study in 8/12 showed moderate to severe OSA requiring CPAP   Perimenopausal 03/28/2017   Polyneuropathy in diabetes(357.2)    Presence of permanent cardiac pacemaker    Rash 07/14/2016   Seizures (HCC)    last 3 months    Shortness of breath    none now   Stroke [facility]) 12/2013   "my left side is paralyzed" (07/04/2014)   Tachycardia 07/04/2014   Thoracic or lumbosacral neuritis or radiculitis, unspecified    Tinea cruris 09/13/2022   Type II diabetes mellitus (HCC) DX: 2002   Urinary frequency 11/05/2014   Vaginal discharge 02/05/2021   Past Surgical History:  Procedure Laterality Date   AMPUTATION Right 05/18/2023   Procedure: RIGHT FOTT 5TH TOE AMPUTATION;  Surgeon: [name], MD;  Location: [facility] OR;  Service: Orthopedics;  Laterality: Right;   BI-VENTRICULAR IMPLANTABLE CARDIOVERTER DEFIBRILLATOR N/A 08/22/2013   Procedure: BI-VENTRICULAR IMPLANTABLE CARDIOVERTER DEFIBRILLATOR  (CRT-D);  Surgeon: [name], MD;  Location: [facility] CATH LAB;  Service: Cardiovascular;  Laterality: N/A;   BI-VENTRICULAR IMPLANTABLE CARDIOVERTER DEFIBRILLATOR  (CRT-D)  08/2013   [name] 08/23/2013   BIV ICD GENERATOR CHANGEOUT N/A 03/18/2021   Procedure: BIV ICD GENERATOR CHANGEOUT;  Surgeon: [name], MD;  Location: [facility] INVASIVE CV LAB;  Service: Cardiovascular;  Laterality: N/A;   BREAST SURGERY Bilateral 2011   patient reports benign results   CARDIAC CATHETERIZATION  05/2011   CARPAL TUNNEL RELEASE Left    denies   HEMIARTHROPLASTY SHOULDER FRACTURE Right 1980's   denies   I & D EXTREMITY Right 03/06/2023   Procedure: IRRIGATION AND DEBRIDEMENT OF RIGHT FOOT;  Surgeon: [name], MD;  Location: [facility] OR;  Service: Orthopedics;  Laterality: Right;   I & D EXTREMITY Right 03/10/2023   Procedure: RIGHT FOOT INCISION AND DEBRIDEMENT;  Surgeon: [name], MD;  Location: [facility] OR;  Service: Orthopedics;  Laterality: Right;   MULTIPLE EXTRACTIONS WITH ALVEOLOPLASTY Bilateral 05/20/2017   Procedure: MULTIPLE EXTRACTION;  Surgeon: [name], DDS;  Location: [facility] OR;  Service: Oral Surgery;  Laterality: Bilateral;   MULTIPLE TOOTH EXTRACTIONS  ~ 2011   tumors removed ; "my whole top"   SHOULDER ARTHROSCOPY Right  12/26/2015   Procedure: Right Shoulder Arthroscopy, Debridement, and Decompression;  Surgeon: [name], MD;  Location: [facility] OR;  Service: Orthopedics;  Laterality: Right;   TEE WITHOUT CARDIOVERSION N/A 01/14/2014   Procedure: TRANSESOPHAGEAL ECHOCARDIOGRAM (TEE);  Surgeon: Thurmon Fair, MD;  Location: [facility] ENDOSCOPY;  Service: Cardiovascular;  Laterality: N/A;   TUBAL LIGATION  05/31/1985     A IV Location/Drains/Wounds Patient Lines/Drains/Airways Status     Active Line/Drains/Airways     Name Placement date Placement time Site Days   Peripheral IV 03/02/24 20 G 1" Right Antecubital 03/02/24  0852  Antecubital  less than 1   Negative Pressure Wound Therapy Foot Anterior;Right 05/18/23  1613  --  289   Pressure Injury 09/13/22 Elbow Left;Posterior Deep Tissue Pressure Injury - Purple or maroon localized area of discolored intact skin or blood-filled blister due to damage of underlying soft tissue from pressure and/or shear. purple scabbed 09/13/22  1015  --  536   Wound / Incision (Open or Dehisced) 03/05/23 Laceration Buttocks Left pt scratched her butt so hard took first layer of skin off 03/05/23  --  Buttocks  363   Wound / Incision (Open or Dehisced) 05/16/23 Diabetic ulcer Foot Right;Lateral 05/16/23  0600  Foot  291            Intake/Output Last 24 hours No intake or output data in the 24 hours ending 03/02/24 1340  Labs/Imaging Results for orders placed or performed during the hospital encounter of 03/02/24 (from the past 48 hours)  Resp panel by RT-PCR (RSV, Flu A&B, Covid) Anterior Nasal Swab     Status: None   Collection Time: 03/02/24  8:22 AM   Specimen: Anterior Nasal Swab  Result Value Ref Range   SARS Coronavirus 2 by RT PCR NEGATIVE NEGATIVE   Influenza A by PCR NEGATIVE NEGATIVE   Influenza B by PCR NEGATIVE NEGATIVE    Comment: (NOTE) The Xpert Xpress SARS-CoV-2/FLU/RSV plus assay is intended as an aid in the diagnosis of influenza from Nasopharyngeal swab  specimens and should not be used as a sole basis for treatment. Nasal washings and aspirates are unacceptable for Xpert Xpress SARS-CoV-2/FLU/RSV testing.  Fact Sheet for Patients: BloggerCourse.com  Fact Sheet for Healthcare Providers: SeriousBroker.it  This test is not yet approved or cleared by the [place] FDA and has been authorized for detection and/or diagnosis of SARS-CoV-2 by FDA under an Emergency Use Authorization (EUA). This EUA will remain in effect (meaning this test can be used) for the duration of the COVID-19 declaration under Section 564(b)(1) of the Act, 21 U.S.C. section 360bbb-3(b)(1), unless the authorization is terminated or revoked.     Resp Syncytial Virus by PCR NEGATIVE NEGATIVE    Comment: (NOTE) Fact Sheet for Patients: BloggerCourse.com  Fact Sheet for Healthcare Providers: SeriousBroker.it  This test is not yet approved or cleared by the [place] FDA and has been authorized for detection and/or diagnosis of SARS-CoV-2 by FDA under an Emergency Use Authorization (EUA). This EUA will remain in effect (meaning this test can be used) for the duration of the COVID-19 declaration under Section 564(b)(1) of the Act, 21 U.S.C. section 360bbb-3(b)(1), unless the authorization is terminated or revoked.  Performed at [facility], [address] [place], [place] [address]   Comprehensive metabolic panel     Status: Abnormal   Collection Time: 03/02/24  8:44 AM  Result Value Ref Range   Sodium 135 135 - 145 mmol/L   Potassium 3.4 (L) 3.5 - 5.1 mmol/L   Chloride 99 98 - 111 mmol/L   CO2 24 22 - 32 mmol/L   Glucose, Bld 239 (H) 70 - 99 mg/dL    Comment: Glucose reference range applies only to samples taken after fasting for at least 8 hours.   BUN 7 6 - 20 mg/dL   Creatinine, Ser 2.95 0.44 - 1.00 mg/dL   Calcium 9.0 8.9 - 62.1 mg/dL    Total Protein 8.0 6.5 - 8.1 g/dL   Albumin 4.0 3.5 - 5.0 g/dL   AST 17 15 - 41 U/L   ALT 14 0 - 44 U/L   Alkaline Phosphatase 72 38 - 126 U/L   Total Bilirubin 1.3 (H) 0.0 - 1.2 mg/dL   GFR, Estimated >30 >86 mL/min    Comment: (NOTE) Calculated using the CKD-EPI Creatinine Equation (2021)    Anion gap 12 5 - 15    Comment: Performed at [facility], [address], [place], [place] [address]  Lipase, blood     Status: None   Collection Time: 03/02/24  8:44 AM  Result Value Ref Range   Lipase 19 11 - 51 U/L    Comment: Performed at [facility], [address] [place], [place] [address]  CBC with Differential     Status: Abnormal   Collection Time: 03/02/24  8:44 AM  Result Value Ref Range   WBC 18.3 (H) 4.0 - 10.5 K/uL   RBC 4.83 3.87 - 5.11 MIL/uL   Hemoglobin 13.3 12.0 - 15.0 g/dL   HCT 86.5 78.4 - 69.6 %   MCV 84.5 80.0 - 100.0 fL   MCH 27.5 26.0 - 34.0 pg   MCHC 32.6 30.0 - 36.0 g/dL   RDW 29.5 28.4 - 13.2 %   Platelets 184 150 - 400 K/uL   nRBC 0.0 0.0 - 0.2 %   Neutrophils Relative % 87 %   Neutro Abs 15.8 (H) 1.7 - 7.7 K/uL   Lymphocytes Relative 6 %   Lymphs Abs 1.2 0.7 - 4.0 K/uL   Monocytes Relative 6 %   Monocytes Absolute 1.1 (H) 0.1 - 1.0 K/uL   Eosinophils Relative 0 %   Eosinophils Absolute 0.0 0.0 - 0.5 K/uL   Basophils Relative 0 %   Basophils Absolute 0.0 0.0 - 0.1 K/uL   Immature Granulocytes 1 %   Abs Immature Granulocytes 0.15 (H) 0.00 - 0.07 K/uL    Comment: Performed at [facility], [address], [place], [place] [address]  Urinalysis, Routine w reflex microscopic -Urine, Clean Catch     Status: Abnormal   Collection Time: 03/02/24 10:04 AM  Result Value Ref Range   Color, Urine YELLOW YELLOW   APPearance CLEAR CLEAR   Specific Gravity, Urine 1.020 1.005 - 1.030   pH 6.0 5.0 - 8.0   Glucose, UA >=500 (A) NEGATIVE mg/dL   Hgb urine dipstick TRACE (A) NEGATIVE   Bilirubin Urine NEGATIVE NEGATIVE   Ketones, ur NEGATIVE  NEGATIVE mg/dL   Protein, ur NEGATIVE NEGATIVE mg/dL   Nitrite NEGATIVE NEGATIVE   Leukocytes,Ua SMALL (A) NEGATIVE    Comment: Performed at [facility], [address], [place], [place] [address]  Urinalysis, Microscopic (reflex)     Status: Abnormal   Collection Time: 03/02/24 10:04 AM  Result Value Ref Range   RBC / HPF 0-5 0 - 5 RBC/hpf   WBC, UA 6-10 0 - 5 WBC/hpf   Bacteria, UA MANY (A) NONE SEEN   Squamous Epithelial / HPF 0-5 0 - 5 /HPF    Comment: Performed at [facility], [address] [place], [place] [address]  Protime-INR     Status: Abnormal   Collection Time: 03/02/24 10:15 AM  Result Value Ref Range   Prothrombin Time 15.8 (H) 11.4 - 15.2 seconds   INR 1.2 0.8 - 1.2    Comment: (NOTE) INR goal varies based on device and disease states. Performed at [facility], [address], [place], [place] [address]   APTT     Status: None   Collection Time: 03/02/24 10:15 AM  Result Value Ref Range   aPTT 32 24 - 36 seconds    Comment: Performed at [facility], [address], [place], [place] [address]  Type and screen [facility]     Status: None   Collection Time: 03/02/24 10:27 AM  Result Value Ref Range   ABO/RH(D) O POS    Antibody Screen NEG    Sample Expiration      03/05/2024,2359 Performed at [facility], [address] [place], [place] [address]   ABO/Rh     Status: None   Collection Time: 03/02/24 10:28 AM  Result Value Ref Range   ABO/RH(D)      O POS Performed at [facility], [address], [place], [place] [address]    *Note: Due to a large number of results and/or encounters for the requested time period, some results have not been displayed. A complete set of results can be found in Results Review.   CT Head Wo Contrast Result Date: 03/02/2024 CLINICAL DATA:  Sudden severe headache. EXAM: CT HEAD WITHOUT CONTRAST TECHNIQUE: Contiguous axial images were obtained from the base of the  skull through the vertex without intravenous contrast. RADIATION DOSE REDUCTION: This exam was performed according to the departmental dose-optimization program which includes automated exposure control, adjustment of the mA and/or kV according to patient size and/or use of iterative reconstruction technique. COMPARISON:  CT head 06/06/2015 FINDINGS: Brain: Acute subdural hematoma over the right cerebral convexity measuring up to 1.6 cm in maximum thickness over the right frontotemporal lobes. The hemorrhage extends inferiorly over the right temporal lobe into the middle cranial fossa and extends posteriorly along the right tentorial leaflet. Question possible small focus of parenchymal hemorrhage in the inferior right temporal lobe with mild associated edema best appreciated on coronal image 40. Additional component of hemorrhage extending along the anterior skull base along the anterior inferior right frontal lobe. Additional minimal subdural hemorrhage noted posteriorly along the falx and along the left tentorial leaflet measuring up to 2 mm in thickness. Associated mass effect on the parenchyma with sulcal effacement noted throughout the right cerebral hemisphere. No midline shift on the current study. Subdural hemorrhage also noted along the dorsal aspect of the clivus measuring up to 1.0 cm in thickness without significant mass effect on the brainstem. Hemorrhage likely extends inferiorly into the cervical spinal canal. Hemorrhage noted in the inferior aspect of the posterior fossa. The basilar cisterns are patent. Encephalomalacia in the right frontal operculum compatible with prior infarct. No CT findings to suggest acute infarct. Remote infarct in the left thalamus. Nonspecific hypoattenuation in the periventricular and subcortical white matter favored to reflect chronic microvascular ischemic changes. Ventricles: Similar caliber of the ventricles. No hydrocephalus. No intraventricular blood products noted.  Vascular: No hyperdense vessel or unexpected calcification. Skull: No acute or aggressive finding. Orbits: Orbits are symmetric. Sinuses: Mucosal thickening resulting in near complete opacification of the right frontal sinus. Additional scattered mucosal thickening in the ethmoid sinuses. Other: Mastoid air cells are clear. Traumatic Brain Injury Risk Stratification Skull Fracture: No - Low/mBIG 1 Subdural Hematoma (SDH): 8mm plus - High/mBIG 3 Subarachnoid Hemorrhage [facility]): No Epidural Hematoma (EDH): No - Low/mBIG 1 Cerebral contusion, intra-axial, intraparenchymal Hemorrhage (IPH): 8mm plus or multiple - High/mBIG 3 Intraventricular Hemorrhage (IVH): No - Low/mBIG 1 Midline Shift > 1mm or Edema/effacement of sulci/vents: No - Low/mBIG 1 ---------------------------------------------------- IMPRESSION: Extensive subdural hematoma over the right cerebral hemisphere with extension along the tentorial leaflets and posterior falx. Additional hemorrhage along the dorsal clivus and in the inferior aspect of the posterior fossa likely extending into the cervical spinal canal. Additional small focus of hemorrhage in the inferior right temporal lobe with surrounding edema which may reflect a focus of parenchymal hemorrhage measuring up to 8  mm in size. No midline shift. Additional chronic changes as above. These results were called by telephone at the time of interpretation on 03/02/2024 at 9:51 am to provider Dr. Estanislado Pandy, who verbally acknowledged these results. Electronically Signed   By: Emily Filbert M.D.   On: 03/02/2024 09:59    Pending Labs Unresulted Labs (From admission, onward)     Start     Ordered   03/02/24 1305  Magnesium  Add-on,   AD        03/02/24 1304   03/02/24 1301  Hemoglobin A1c  Once,   R       Comments: To assess prior glycemic control    03/02/24 1302   03/02/24 1228  HIV Antibody (routine testing w rflx)  (HIV Antibody (Routine testing w reflex) panel)  Once,   R        03/02/24  1229            Vitals/Pain Today's Vitals   03/02/24 1145 03/02/24 1315 03/02/24 1322 03/02/24 1330  BP: (!) 124/57 (!) 173/96  (!) 159/90  Pulse: 89 83  87  Resp:  16  (!) 7  Temp:      TempSrc:      SpO2: 98% 97%  99%  Weight:      Height:      PainSc:   8      Isolation Precautions No active isolations  Medications Medications  clevidipine (CLEVIPREX) infusion 0.5 mg/mL (4 mg/hr Intravenous Rate/Dose Change 03/02/24 1319)  insulin aspart (novoLOG) injection 0-15 Units (has no administration in time range)  pantoprazole (PROTONIX) injection 40 mg (has no administration in time range)  levETIRAcetam (KEPPRA) IVPB 500 mg/100 mL premix (has no administration in time range)  hydrALAZINE (APRESOLINE) injection 10-20 mg (has no administration in time range)  HYDROmorphone (DILAUDID) injection 0.5 mg (0.5 mg Intravenous Given 03/02/24 1322)  potassium chloride SA (KLOR-CON M) CR tablet 40 mEq (has no administration in time range)  acetaminophen (TYLENOL) tablet 650 mg (has no administration in time range)  polyethylene glycol (MIRALAX / GLYCOLAX) packet 17 g (has no administration in time range)  ondansetron (ZOFRAN) injection 4 mg (4 mg Intravenous Given 03/02/24 0853)  HYDROmorphone (DILAUDID) injection 0.5 mg (0.5 mg Intravenous Given 03/02/24 0854)  coag fact Xa recombinant (ANDEXXA) low dose infusion 900 mg (0 mg Intravenous Stopped 03/02/24 1305)  HYDROmorphone (DILAUDID) injection 0.5 mg (0.5 mg Intravenous Given 03/02/24 1111)    Mobility walks with device     Focused Assessments Neuro Assessment Handoff:  Swallow screen pass?  Not completed yet,  maintaining secretions          Neuro Assessment:   Neuro Checks:      Has TPA been given? No If patient is a Neuro Trauma and patient is going to OR before floor call report to 4N Charge nurse: [phone] or [phone]   R Recommendations: See Admitting Provider Note  Report given to:   Additional Notes:  Pt is able to verbalize needs, AO, says walks with her walker and brace

## 2024-03-02 NOTE — ED Notes (Signed)
 Pt was incontinent of urine. Changed pt into a clean brief.
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
## 2024-03-03 ENCOUNTER — Other Ambulatory Visit (HOSPITAL_COMMUNITY): Payer: Self-pay

## 2024-03-03 DIAGNOSIS — K219 Gastro-esophageal reflux disease without esophagitis: Secondary | ICD-10-CM | POA: Diagnosis not present

## 2024-03-03 DIAGNOSIS — I480 Paroxysmal atrial fibrillation: Secondary | ICD-10-CM

## 2024-03-03 DIAGNOSIS — S065XAA Traumatic subdural hemorrhage with loss of consciousness status unknown, initial encounter: Secondary | ICD-10-CM | POA: Diagnosis not present

## 2024-03-03 LAB — TRIGLYCERIDES: Triglycerides: 76 mg/dL (ref <150)

## 2024-03-03 LAB — CBC
HCT: 38.3 % (ref 36.0–46.0)
Hemoglobin: 12.3 g/dL (ref 12.0–15.0)
MCH: 26.9 pg (ref 26.0–34.0)
MCHC: 32.1 g/dL (ref 30.0–36.0)
MCV: 83.8 fL (ref 80.0–100.0)
Platelets: 170 10*3/uL (ref 150–400)
RBC: 4.57 MIL/uL (ref 3.87–5.11)
RDW: 12.3 % (ref 11.5–15.5)
WBC: 15.7 10*3/uL — ABNORMAL HIGH (ref 4.0–10.5)
nRBC: 0 % (ref 0.0–0.2)

## 2024-03-03 LAB — BASIC METABOLIC PANEL
Anion gap: 7 (ref 5–15)
BUN: 6 mg/dL (ref 6–20)
CO2: 25 mmol/L (ref 22–32)
Calcium: 8.5 mg/dL — ABNORMAL LOW (ref 8.9–10.3)
Chloride: 100 mmol/L (ref 98–111)
Creatinine, Ser: 0.71 mg/dL (ref 0.44–1.00)
GFR, Estimated: >60 mL/min (ref >60)
Glucose, Bld: 221 mg/dL — ABNORMAL HIGH (ref 70–99)
Potassium: 3.6 mmol/L (ref 3.5–5.1)
Sodium: 132 mmol/L — ABNORMAL LOW (ref 135–145)

## 2024-03-03 LAB — GLUCOSE, CAPILLARY
Glucose-Capillary: 167 mg/dL — ABNORMAL HIGH (ref 70–99)
Glucose-Capillary: 189 mg/dL — ABNORMAL HIGH (ref 70–99)
Glucose-Capillary: 202 mg/dL — ABNORMAL HIGH (ref 70–99)
Glucose-Capillary: 203 mg/dL — ABNORMAL HIGH (ref 70–99)
Glucose-Capillary: 219 mg/dL — ABNORMAL HIGH (ref 70–99)
Glucose-Capillary: 219 mg/dL — ABNORMAL HIGH (ref 70–99)
Glucose-Capillary: 232 mg/dL — ABNORMAL HIGH (ref 70–99)

## 2024-03-03 LAB — MAGNESIUM: Magnesium: 1.5 mg/dL — ABNORMAL LOW (ref 1.7–2.4)

## 2024-03-03 MED ORDER — POTASSIUM CHLORIDE 10 MEQ/100ML IV SOLN
10.0000 meq | INTRAVENOUS | Status: AC
  Administered 2024-03-03 (×4): 10 meq via INTRAVENOUS
  Filled 2024-03-03 (×4): qty 100

## 2024-03-03 MED ORDER — HYDRALAZINE HCL 50 MG PO TABS
50.0000 mg | ORAL_TABLET | Freq: Three times a day (TID) | ORAL | Status: DC
  Administered 2024-03-03: 50 mg via ORAL
  Filled 2024-03-03: qty 1

## 2024-03-03 MED ORDER — HYDRALAZINE HCL 50 MG PO TABS
75.0000 mg | ORAL_TABLET | Freq: Three times a day (TID) | ORAL | Status: DC
  Administered 2024-03-03 – 2024-03-04 (×3): 75 mg via ORAL
  Filled 2024-03-03 (×3): qty 1

## 2024-03-03 MED ORDER — HYDROMORPHONE HCL 1 MG/ML IJ SOLN
1.0000 mg | INTRAMUSCULAR | Status: DC | PRN
  Administered 2024-03-03 – 2024-03-06 (×7): 1 mg via INTRAVENOUS
  Filled 2024-03-03 (×8): qty 1

## 2024-03-03 MED ORDER — MAGNESIUM SULFATE 4 GM/100ML IV SOLN
4.0000 g | Freq: Once | INTRAVENOUS | Status: AC
  Administered 2024-03-03: 4 g via INTRAVENOUS
  Filled 2024-03-03: qty 100

## 2024-03-03 MED ORDER — PANTOPRAZOLE SODIUM 40 MG PO TBEC
40.0000 mg | DELAYED_RELEASE_TABLET | Freq: Every day | ORAL | Status: DC
  Administered 2024-03-04 – 2024-03-06 (×3): 40 mg via ORAL
  Filled 2024-03-03 (×3): qty 1

## 2024-03-03 MED ORDER — ISOSORBIDE MONONITRATE ER 60 MG PO TB24
90.0000 mg | ORAL_TABLET | Freq: Every day | ORAL | Status: DC
  Administered 2024-03-04 – 2024-03-07 (×4): 90 mg via ORAL
  Filled 2024-03-03 (×4): qty 1

## 2024-03-03 MED ORDER — METOPROLOL SUCCINATE ER 100 MG PO TB24
100.0000 mg | ORAL_TABLET | Freq: Every day | ORAL | Status: DC
  Administered 2024-03-04 – 2024-03-07 (×4): 100 mg via ORAL
  Filled 2024-03-03: qty 1
  Filled 2024-03-03: qty 2
  Filled 2024-03-03: qty 1
  Filled 2024-03-03: qty 2

## 2024-03-03 MED ORDER — SPIRONOLACTONE 25 MG PO TABS
25.0000 mg | ORAL_TABLET | Freq: Every day | ORAL | Status: DC
  Administered 2024-03-03 – 2024-03-07 (×5): 25 mg via ORAL
  Filled 2024-03-03 (×5): qty 1

## 2024-03-03 MED ORDER — INSULIN GLARGINE-YFGN 100 UNIT/ML ~~LOC~~ SOPN: 6.0000 [IU] | PEN_INJECTOR | SUBCUTANEOUS | Status: DC

## 2024-03-03 MED ORDER — OXYCODONE HCL 5 MG PO TABS
5.0000 mg | ORAL_TABLET | ORAL | Status: DC | PRN
  Administered 2024-03-03 – 2024-03-07 (×10): 5 mg via ORAL
  Filled 2024-03-03 (×10): qty 1

## 2024-03-03 MED ORDER — METOPROLOL SUCCINATE ER 50 MG PO TB24
50.0000 mg | ORAL_TABLET | Freq: Every day | ORAL | Status: DC
  Administered 2024-03-03: 50 mg via ORAL
  Filled 2024-03-03: qty 1

## 2024-03-03 MED ORDER — INSULIN GLARGINE 100 UNIT/ML ~~LOC~~ SOLN
6.0000 [IU] | Freq: Every day | SUBCUTANEOUS | Status: DC
  Administered 2024-03-03: 6 [IU] via SUBCUTANEOUS
  Filled 2024-03-03 (×2): qty 0.06

## 2024-03-03 MED ORDER — ISOSORBIDE MONONITRATE ER 60 MG PO TB24
60.0000 mg | ORAL_TABLET | Freq: Every day | ORAL | Status: DC
  Administered 2024-03-03: 60 mg via ORAL
  Filled 2024-03-03: qty 1

## 2024-03-03 MED ORDER — ACETAMINOPHEN 325 MG PO TABS
650.0000 mg | ORAL_TABLET | Freq: Four times a day (QID) | ORAL | Status: DC | PRN
  Administered 2024-03-05 – 2024-03-07 (×4): 650 mg via ORAL
  Filled 2024-03-03 (×5): qty 2

## 2024-03-03 MED ORDER — LEVETIRACETAM 500 MG PO TABS
500.0000 mg | ORAL_TABLET | Freq: Two times a day (BID) | ORAL | Status: DC
  Administered 2024-03-03 – 2024-03-05 (×5): 500 mg via ORAL
  Filled 2024-03-03 (×5): qty 1

## 2024-03-03 MED ORDER — IBUPROFEN 200 MG PO TABS
400.0000 mg | ORAL_TABLET | ORAL | Status: DC | PRN
Start: 2024-03-03 — End: 2024-03-03

## 2024-03-03 NOTE — Progress Notes (Signed)
 [facility] ADULT ICU REPLACEMENT PROTOCOL   The patient does apply for the [facility] Adult ICU Electrolyte Replacment Protocol based on the criteria listed below:   1.Exclusion criteria: TCTS, ECMO, Dialysis, and Myasthenia Gravis patients 2. Is GFR >/= 30 ml/min? Yes.    Patient's GFR today is >60 3. Is SCr </= 2? Yes.   Patient's SCr is 0.71 mg/dL 4. Did SCr increase >/= 0.5 in 24 hours? No. 5.Pt's weight >40kg  Yes.   6. Abnormal electrolyte(s): K+ 3.6, Mag 1.5  7. Electrolytes replaced per protocol 8.  Call MD STAT for K+ </= 2.5, Phos </= 1, or Mag </= 1 Physician:  Lala Lund [facility] 03/03/2024 4:56 AM

## 2024-03-03 NOTE — Progress Notes (Signed)
 Patient ID: [name], female   DOB: 12-26-1965, 58 y.o.   MRN: [id number] BP 132/87   Pulse 80   Temp 98.6 F (37 C) (Oral)   Resp 12   Ht 5\' 9"  (1.753 m)   Wt 114.7 kg   LMP 09/13/2019 (Exact Date)   SpO2 92%   BMI 37.34 kg/m  Films reviewed, there is no change. Will see later on rounds.

## 2024-03-03 NOTE — Plan of Care (Signed)

## 2024-03-03 NOTE — Progress Notes (Signed)
   03/03/24 0120  BiPAP/CPAP/SIPAP  BiPAP/CPAP/SIPAP Pt Type Adult  Reason BIPAP/CPAP not in use Non-compliant  BiPAP/CPAP /SiPAP Vitals  Bilateral Breath Sounds Clear;Diminished
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
## 2024-03-03 NOTE — Progress Notes (Signed)
   03/03/24 2327  BiPAP/CPAP/SIPAP  $ Non-Invasive Ventilator  Non-Invasive Vent Initial  $ Face Mask Small Yes  BiPAP/CPAP/SIPAP Pt Type Adult  BiPAP/CPAP/SIPAP DREAMSTATIOND  Mask Type Full face mask  Mask Size Small  PEEP 5 cmH20  FiO2 (%) 32 %  Flow Rate 3 lpm  Patient Home Equipment No  BiPAP/CPAP /SiPAP Vitals  Bilateral Breath Sounds Clear;Diminished

## 2024-03-03 NOTE — Progress Notes (Signed)
   03/03/24 1949  BiPAP/CPAP/SIPAP  BiPAP/CPAP/SIPAP Pt Type Adult  Reason BIPAP/CPAP not in use Non-compliant  BiPAP/CPAP /SiPAP Vitals  Pulse Rate 85  Resp 16  SpO2 93 %  Bilateral Breath Sounds Clear;Diminished  MEWS Score/Color  MEWS Score 1  MEWS Score Color [place] Si
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
## 2024-03-04 DIAGNOSIS — S065XAA Traumatic subdural hemorrhage with loss of consciousness status unknown, initial encounter: Secondary | ICD-10-CM | POA: Diagnosis not present

## 2024-03-04 DIAGNOSIS — I480 Paroxysmal atrial fibrillation: Secondary | ICD-10-CM | POA: Diagnosis not present

## 2024-03-04 DIAGNOSIS — K219 Gastro-esophageal reflux disease without esophagitis: Secondary | ICD-10-CM | POA: Diagnosis not present

## 2024-03-04 LAB — COMPREHENSIVE METABOLIC PANEL
ALT: 11 U/L (ref 0–44)
AST: 14 U/L — ABNORMAL LOW (ref 15–41)
Albumin: 3.8 g/dL (ref 3.5–5.0)
Alkaline Phosphatase: 71 U/L (ref 38–126)
Anion gap: 10 (ref 5–15)
BUN: 13 mg/dL (ref 6–20)
CO2: 26 mmol/L (ref 22–32)
Calcium: 9.3 mg/dL (ref 8.9–10.3)
Chloride: 96 mmol/L — ABNORMAL LOW (ref 98–111)
Creatinine, Ser: 0.74 mg/dL (ref 0.44–1.00)
GFR, Estimated: >60 mL/min (ref >60)
Glucose, Bld: 205 mg/dL — ABNORMAL HIGH (ref 70–99)
Potassium: 4.2 mmol/L (ref 3.5–5.1)
Sodium: 132 mmol/L — ABNORMAL LOW (ref 135–145)
Total Bilirubin: 1.4 mg/dL — ABNORMAL HIGH (ref 0.0–1.2)
Total Protein: 7.9 g/dL (ref 6.5–8.1)

## 2024-03-04 LAB — GLUCOSE, CAPILLARY
Glucose-Capillary: 221 mg/dL — ABNORMAL HIGH (ref 70–99)
Glucose-Capillary: 188 mg/dL — ABNORMAL HIGH (ref 70–99)
Glucose-Capillary: 215 mg/dL — ABNORMAL HIGH (ref 70–99)
Glucose-Capillary: 193 mg/dL — ABNORMAL HIGH (ref 70–99)
Glucose-Capillary: 186 mg/dL — ABNORMAL HIGH (ref 70–99)
Glucose-Capillary: 189 mg/dL — ABNORMAL HIGH (ref 70–99)

## 2024-03-04 MED ORDER — IRBESARTAN 150 MG PO TABS
150.0000 mg | ORAL_TABLET | Freq: Every day | ORAL | Status: DC
  Administered 2024-03-04: 150 mg via ORAL
  Filled 2024-03-04 (×2): qty 1

## 2024-03-04 MED ORDER — FUROSEMIDE 10 MG/ML IJ SOLN
40.0000 mg | Freq: Once | INTRAMUSCULAR | Status: AC
  Administered 2024-03-04: 40 mg via INTRAVENOUS
  Filled 2024-03-04: qty 4

## 2024-03-04 MED ORDER — INSULIN GLARGINE 100 UNIT/ML ~~LOC~~ SOLN
12.0000 [IU] | Freq: Every day | SUBCUTANEOUS | Status: DC
  Administered 2024-03-04 – 2024-03-05 (×2): 12 [IU] via SUBCUTANEOUS
  Filled 2024-03-04 (×3): qty 0.12

## 2024-03-04 MED ORDER — HYDRALAZINE HCL 50 MG PO TABS
100.0000 mg | ORAL_TABLET | Freq: Three times a day (TID) | ORAL | Status: DC
  Administered 2024-03-04 – 2024-03-07 (×8): 100 mg via ORAL
  Filled 2024-03-04 (×9): qty 2
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
## 2024-03-04 NOTE — Plan of Care (Signed)

## 2024-03-04 NOTE — Progress Notes (Signed)
   03/04/24 2308  BiPAP/CPAP/SIPAP  $ Non-Invasive Ventilator  Non-Invasive Vent Subsequent  BiPAP/CPAP/SIPAP Pt Type Adult  BiPAP/CPAP/SIPAP DREAMSTATIOND  Mask Type Full face mask  Mask Size Small  PEEP 5 cmH20  FiO2 (%) 32 %  Flow Rate 3 lpm  Patient Home Equipment No  BiPAP/CPAP /SiPAP Vitals  SpO2 95 %  Bilateral Breath Sounds Clear;Diminished

## 2024-03-05 DIAGNOSIS — S065XAA Traumatic subdural hemorrhage with loss of consciousness status unknown, initial encounter: Secondary | ICD-10-CM | POA: Diagnosis not present

## 2024-03-05 DIAGNOSIS — I480 Paroxysmal atrial fibrillation: Secondary | ICD-10-CM | POA: Diagnosis not present

## 2024-03-05 DIAGNOSIS — K219 Gastro-esophageal reflux disease without esophagitis: Secondary | ICD-10-CM | POA: Diagnosis not present

## 2024-03-05 LAB — COMPREHENSIVE METABOLIC PANEL
ALT: 12 U/L (ref 0–44)
AST: 14 U/L — ABNORMAL LOW (ref 15–41)
Albumin: 3.6 g/dL (ref 3.5–5.0)
Alkaline Phosphatase: 61 U/L (ref 38–126)
Anion gap: 10 (ref 5–15)
BUN: 17 mg/dL (ref 6–20)
CO2: 26 mmol/L (ref 22–32)
Calcium: 9.2 mg/dL (ref 8.9–10.3)
Chloride: 95 mmol/L — ABNORMAL LOW (ref 98–111)
Creatinine, Ser: 0.82 mg/dL (ref 0.44–1.00)
GFR, Estimated: >60 mL/min (ref >60)
Glucose, Bld: 174 mg/dL — ABNORMAL HIGH (ref 70–99)
Potassium: 3.6 mmol/L (ref 3.5–5.1)
Sodium: 131 mmol/L — ABNORMAL LOW (ref 135–145)
Total Bilirubin: 1.5 mg/dL — ABNORMAL HIGH (ref 0.0–1.2)
Total Protein: 7.8 g/dL (ref 6.5–8.1)

## 2024-03-05 LAB — CBC
HCT: 38.5 % (ref 36.0–46.0)
Hemoglobin: 12.3 g/dL (ref 12.0–15.0)
MCH: 26.8 pg (ref 26.0–34.0)
MCHC: 31.9 g/dL (ref 30.0–36.0)
MCV: 83.9 fL (ref 80.0–100.0)
Platelets: 189 10*3/uL (ref 150–400)
RBC: 4.59 MIL/uL (ref 3.87–5.11)
RDW: 12.5 % (ref 11.5–15.5)
WBC: 15.7 10*3/uL — ABNORMAL HIGH (ref 4.0–10.5)
nRBC: 0.2 % (ref 0.0–0.2)

## 2024-03-05 LAB — GLUCOSE, CAPILLARY
Glucose-Capillary: 197 mg/dL — ABNORMAL HIGH (ref 70–99)
Glucose-Capillary: 155 mg/dL — ABNORMAL HIGH (ref 70–99)
Glucose-Capillary: 259 mg/dL — ABNORMAL HIGH (ref 70–99)
Glucose-Capillary: 309 mg/dL — ABNORMAL HIGH (ref 70–99)
Glucose-Capillary: 265 mg/dL — ABNORMAL HIGH (ref 70–99)

## 2024-03-05 MED ORDER — ENSURE ENLIVE PO LIQD
237.0000 mL | Freq: Two times a day (BID) | ORAL | Status: DC
  Administered 2024-03-05 – 2024-03-07 (×5): 237 mL via ORAL

## 2024-03-05 MED ORDER — IRBESARTAN 300 MG PO TABS
300.0000 mg | ORAL_TABLET | Freq: Every day | ORAL | Status: DC
  Administered 2024-03-05 – 2024-03-07 (×3): 300 mg via ORAL
  Filled 2024-03-05 (×3): qty 1

## 2024-03-05 MED ORDER — POLYETHYLENE GLYCOL 3350 17 G PO PACK
17.0000 g | PACK | Freq: Every day | ORAL | Status: DC
  Administered 2024-03-05 – 2024-03-07 (×3): 17 g via ORAL
  Filled 2024-03-05 (×3): qty 1

## 2024-03-05 MED ORDER — ENOXAPARIN SODIUM 40 MG/0.4ML IJ SOSY
40.0000 mg | PREFILLED_SYRINGE | INTRAMUSCULAR | Status: DC
  Administered 2024-03-05 – 2024-03-07 (×3): 40 mg via SUBCUTANEOUS
  Filled 2024-03-05 (×3): qty 0.4

## 2024-03-05 MED ORDER — FUROSEMIDE 10 MG/ML IJ SOLN
40.0000 mg | Freq: Once | INTRAMUSCULAR | Status: AC
  Administered 2024-03-05: 40 mg via INTRAVENOUS
  Filled 2024-03-05: qty 4

## 2024-03-05 MED ORDER — POTASSIUM CHLORIDE CRYS ER 20 MEQ PO TBCR
40.0000 meq | EXTENDED_RELEASE_TABLET | Freq: Once | ORAL | Status: AC
  Administered 2024-03-05: 40 meq via ORAL
  Filled 2024-03-05: qty 2

## 2024-03-05 MED ORDER — INSULIN ASPART 100 UNIT/ML IJ SOLN
3.0000 [IU] | Freq: Three times a day (TID) | INTRAMUSCULAR | Status: DC
  Administered 2024-03-05 – 2024-03-07 (×5): 3 [IU] via SUBCUTANEOUS

## 2024-03-05 MED ORDER — INSULIN ASPART 100 UNIT/ML IJ SOLN
0.0000 [IU] | Freq: Three times a day (TID) | INTRAMUSCULAR | Status: DC
  Administered 2024-03-05: 11 [IU] via SUBCUTANEOUS
  Administered 2024-03-06 – 2024-03-07 (×4): 5 [IU] via SUBCUTANEOUS
  Administered 2024-03-07: 3 [IU] via SUBCUTANEOUS

## 2024-03-05 MED ORDER — INSULIN ASPART 100 UNIT/ML IJ SOLN
0.0000 [IU] | Freq: Every day | INTRAMUSCULAR | Status: DC
  Administered 2024-03-05: 3 [IU] via SUBCUTANEOUS
  Administered 2024-03-06: 4 [IU] via SUBCUTANEOUS

## 2024-03-05 MED ORDER — INSULIN GLARGINE-YFGN 100 UNIT/ML ~~LOC~~ SOLN
12.0000 [IU] | Freq: Every day | SUBCUTANEOUS | Status: DC
  Administered 2024-03-06: 12 [IU] via SUBCUTANEOUS
  Filled 2024-03-05: qty 0.12
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
## 2024-03-05 NOTE — Inpatient Diabetes Management (Signed)
 Inpatient Diabetes Program Recommendations  AACE/ADA: New Consensus Statement on Inpatient Glycemic Control (2015)  Target Ranges:  Prepandial:   less than 140 mg/dL      Peak postprandial:   less than 180 mg/dL (1-2 hours)      Critically ill patients:  140 - 180 mg/dL   Lab Results  Component Value Date   GLUCAP 155 (H) 03/05/2024   HGBA1C 11.3 (H) 03/02/2024    Review of Glycemic Control  Latest Reference Range & Units 03/04/24 07:38 03/04/24 11:52 03/04/24 16:16 03/04/24 19:42 03/04/24 23:28 03/05/24 03:28 03/05/24 07:18  Glucose-Capillary 70 - 99 mg/dL 161 (H) 096 (H) 045 (H) 186 (H) 189 (H) 197 (H) 155 (H)   Diabetes history: DM 2 Outpatient Diabetes medications:  Lantus 25 units daily Trulicity 3 mg daily Freestyle Libre Current orders for Inpatient glycemic control:  Novolog 0-15 units q 4 hours Lantus 12 units daily Novolog 3 units tid with meals (hold if patient eats less than 50%).   Inpatient Diabetes Program Recommendations:    Consider increasing Lantus to 20 units daily.  Once out of ICU, may change correction to tid with meals and HS.   Thanks  Lorenza Cambridge, RN, BC-ADM Inpatient Diabetes Coordinator Pager [phone] (8a-5p)

## 2024-03-05 NOTE — Progress Notes (Signed)
 Patient's phone found on previous unit (76M) and returned to her on current unit (3W).
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
## 2024-03-05 NOTE — Telephone Encounter (Signed)
 Approved effective 02/23/24-02/17/25 ZO#[id number]

## 2024-03-05 NOTE — Telephone Encounter (Signed)
 Pt has upcoming appt 03/14/2024.
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
## 2024-03-06 ENCOUNTER — Inpatient Hospital Stay (HOSPITAL_COMMUNITY)

## 2024-03-06 DIAGNOSIS — S065XAA Traumatic subdural hemorrhage with loss of consciousness status unknown, initial encounter: Secondary | ICD-10-CM | POA: Diagnosis not present

## 2024-03-06 DIAGNOSIS — I6200 Nontraumatic subdural hemorrhage, unspecified: Secondary | ICD-10-CM | POA: Diagnosis not present

## 2024-03-06 DIAGNOSIS — I69398 Other sequelae of cerebral infarction: Secondary | ICD-10-CM

## 2024-03-06 DIAGNOSIS — D72829 Elevated white blood cell count, unspecified: Secondary | ICD-10-CM

## 2024-03-06 DIAGNOSIS — R569 Unspecified convulsions: Secondary | ICD-10-CM | POA: Diagnosis not present

## 2024-03-06 DIAGNOSIS — I629 Nontraumatic intracranial hemorrhage, unspecified: Secondary | ICD-10-CM | POA: Diagnosis not present

## 2024-03-06 DIAGNOSIS — I69351 Hemiplegia and hemiparesis following cerebral infarction affecting right dominant side: Secondary | ICD-10-CM

## 2024-03-06 DIAGNOSIS — E119 Type 2 diabetes mellitus without complications: Secondary | ICD-10-CM

## 2024-03-06 DIAGNOSIS — Y29XXXA Contact with blunt object, undetermined intent, initial encounter: Secondary | ICD-10-CM

## 2024-03-06 DIAGNOSIS — I10 Essential (primary) hypertension: Secondary | ICD-10-CM

## 2024-03-06 DIAGNOSIS — E871 Hypo-osmolality and hyponatremia: Secondary | ICD-10-CM

## 2024-03-06 DIAGNOSIS — G936 Cerebral edema: Secondary | ICD-10-CM | POA: Diagnosis not present

## 2024-03-06 LAB — RENAL FUNCTION PANEL
Albumin: 3.5 g/dL (ref 3.5–5.0)
Anion gap: 7 (ref 5–15)
BUN: 20 mg/dL (ref 6–20)
CO2: 28 mmol/L (ref 22–32)
Calcium: 8.8 mg/dL — ABNORMAL LOW (ref 8.9–10.3)
Chloride: 95 mmol/L — ABNORMAL LOW (ref 98–111)
Creatinine, Ser: 0.83 mg/dL (ref 0.44–1.00)
GFR, Estimated: >60 mL/min (ref >60)
Glucose, Bld: 205 mg/dL — ABNORMAL HIGH (ref 70–99)
Phosphorus: 3 mg/dL (ref 2.5–4.6)
Potassium: 3.7 mmol/L (ref 3.5–5.1)
Sodium: 130 mmol/L — ABNORMAL LOW (ref 135–145)

## 2024-03-06 LAB — GLUCOSE, CAPILLARY
Glucose-Capillary: 215 mg/dL — ABNORMAL HIGH (ref 70–99)
Glucose-Capillary: 194 mg/dL — ABNORMAL HIGH (ref 70–99)
Glucose-Capillary: 228 mg/dL — ABNORMAL HIGH (ref 70–99)
Glucose-Capillary: 234 mg/dL — ABNORMAL HIGH (ref 70–99)
Glucose-Capillary: 315 mg/dL — ABNORMAL HIGH (ref 70–99)

## 2024-03-06 LAB — CBC
HCT: 36.8 % (ref 36.0–46.0)
Hemoglobin: 12.1 g/dL (ref 12.0–15.0)
MCH: 27.2 pg (ref 26.0–34.0)
MCHC: 32.9 g/dL (ref 30.0–36.0)
MCV: 82.7 fL (ref 80.0–100.0)
Platelets: 212 10*3/uL (ref 150–400)
RBC: 4.45 MIL/uL (ref 3.87–5.11)
RDW: 12.4 % (ref 11.5–15.5)
WBC: 17.7 10*3/uL — ABNORMAL HIGH (ref 4.0–10.5)
nRBC: 0 % (ref 0.0–0.2)

## 2024-03-06 LAB — MAGNESIUM: Magnesium: 1.9 mg/dL (ref 1.7–2.4)

## 2024-03-06 MED ORDER — INSULIN GLARGINE-YFGN 100 UNIT/ML ~~LOC~~ SOLN
15.0000 [IU] | Freq: Every day | SUBCUTANEOUS | Status: DC
  Filled 2024-03-06: qty 0.15

## 2024-03-06 MED ORDER — INSULIN GLARGINE-YFGN 100 UNIT/ML ~~LOC~~ SOLN
3.0000 [IU] | Freq: Once | SUBCUTANEOUS | Status: AC
Start: 2024-03-06 — End: 2024-03-06
  Administered 2024-03-06: 3 [IU] via SUBCUTANEOUS
  Filled 2024-03-06: qty 0.03

## 2024-03-06 MED ORDER — LEVETIRACETAM 750 MG PO TABS
750.0000 mg | ORAL_TABLET | Freq: Two times a day (BID) | ORAL | Status: DC
  Administered 2024-03-06: 750 mg via ORAL
  Filled 2024-03-06: qty 1

## 2024-03-06 MED ORDER — GADOBUTROL 1 MMOL/ML IV SOLN
10.0000 mL | Freq: Once | INTRAVENOUS | Status: AC | PRN
  Administered 2024-03-06: 10 mL via INTRAVENOUS

## 2024-03-06 MED ORDER — HYDRALAZINE HCL 20 MG/ML IJ SOLN
10.0000 mg | INTRAMUSCULAR | Status: DC | PRN
  Administered 2024-03-06 – 2024-03-21 (×7): 10 mg via INTRAVENOUS
  Filled 2024-03-06 (×7): qty 1

## 2024-03-06 MED ORDER — LEVETIRACETAM IN NACL 1000 MG/100ML IV SOLN
1000.0000 mg | Freq: Once | INTRAVENOUS | Status: AC
  Administered 2024-03-06: 1000 mg via INTRAVENOUS
  Filled 2024-03-06: qty 100

## 2024-03-06 NOTE — Progress Notes (Signed)
   03/06/24 2247  Provider Notification  Provider Name/Title On Call(Emily Dean)  Date Provider Notified 03/06/24  Time Provider Notified 2248  Method of Notification Page  Notification Reason Other (Comment) (Pt still having intermittent eyes twitching)  Provider response No new orders  Date of Provider Response 03/06/24  Time of Provider Response 2255

## 2024-03-06 NOTE — Progress Notes (Signed)
   03/06/24 2108  BiPAP/CPAP/SIPAP  BiPAP/CPAP/SIPAP Pt Type Adult  BiPAP/CPAP/SIPAP DREAMSTATIOND  Reason BIPAP/CPAP not in use Non-compliant (pt doesnt wear CPAP)
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
## 2024-03-06 NOTE — Inpatient Diabetes Management (Addendum)
 Inpatient Diabetes Program Recommendations  AACE/ADA: New Consensus Statement on Inpatient Glycemic Control (2015)  Target Ranges:  Prepandial:   less than 140 mg/dL      Peak postprandial:   less than 180 mg/dL (1-2 hours)      Critically ill patients:  140 - 180 mg/dL   Lab Results  Component Value Date   GLUCAP 215 (H) 03/06/2024   HGBA1C 11.3 (H) 03/02/2024    Review of Glycemic Control  Latest Reference Range & Units 03/05/24 07:18 03/05/24 11:14 03/05/24 18:23 03/05/24 21:21 03/06/24 06:21  Glucose-Capillary 70 - 99 mg/dL 161 (H) 096 (H) 045 (H) 265 (H) 215 (H)   Diabetes history: DM 2 Outpatient Diabetes medications: Trulicity 3 mg weekly, Lantus 25 units Daily Current orders for Inpatient glycemic control:  Semglee 12 units Daily Novolog 0-15 units tid + hs Novolog 3 units tid meal coverage  Ensure Enlive bid between meals (40 grams of carbohydrates) A1c 11.3% on 3/21  Inpatient Diabetes Program Recommendations:    -   Increase Novolog meal coverage to 8 units tid   Spoke with pt at bedside regarding A1c and glucose control at home. Pt noticed increase in glucose trends prior to admission. Last known A1c obtained from her PCP was around a 9%. Pt reports consistently taking her insulin at home all the time and checking her glucose multiple times a day. Encouraged pt follow up with PCP. Pt was drowsy. Will see pt again to review discussion and importance of follow up prior to discharge.   Pt shortly after discussion having fixed gaze toward the left with rapid eye movement from side to side not able to speak lasting 2 minutes. Pt able to speak after stating she "felt that".   Thanks,  Christena Deem RN, MSN, BC-ADM Inpatient Diabetes Coordinator Team Pager [phone] (8a-5p)
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
## 2024-03-06 NOTE — Progress Notes (Signed)
 RN escorted patient to MRI this afternoon. Patient noted to have 3 seizure-like episodes while in MRI, each episode lasting approximately 90 seconds. During episodes nystagmus noted, patient oxygen saturation dropped to 82% on room air and patient non-verbal. In between episodes patient verbal and oriented but sleepy.  Patient placed on 4L [place] in MRI and transported back to 3W on 4L. Once back in 3W29 patient RN [name] made aware of events and patient placed on a beside progressive care monitor with continuous pluse oxymetry.  She remained on [facility] and primary RN at bedside.

## 2024-03-07 ENCOUNTER — Inpatient Hospital Stay (HOSPITAL_COMMUNITY)

## 2024-03-07 DIAGNOSIS — G40901 Epilepsy, unspecified, not intractable, with status epilepticus: Secondary | ICD-10-CM

## 2024-03-07 DIAGNOSIS — J9601 Acute respiratory failure with hypoxia: Secondary | ICD-10-CM | POA: Diagnosis not present

## 2024-03-07 DIAGNOSIS — R569 Unspecified convulsions: Secondary | ICD-10-CM

## 2024-03-07 DIAGNOSIS — R561 Post traumatic seizures: Secondary | ICD-10-CM

## 2024-03-07 DIAGNOSIS — S065XAA Traumatic subdural hemorrhage with loss of consciousness status unknown, initial encounter: Secondary | ICD-10-CM | POA: Diagnosis not present

## 2024-03-07 DIAGNOSIS — D72829 Elevated white blood cell count, unspecified: Secondary | ICD-10-CM | POA: Diagnosis not present

## 2024-03-07 DIAGNOSIS — J9811 Atelectasis: Secondary | ICD-10-CM | POA: Diagnosis not present

## 2024-03-07 DIAGNOSIS — I6200 Nontraumatic subdural hemorrhage, unspecified: Secondary | ICD-10-CM | POA: Diagnosis not present

## 2024-03-07 DIAGNOSIS — Z4682 Encounter for fitting and adjustment of non-vascular catheter: Secondary | ICD-10-CM | POA: Diagnosis not present

## 2024-03-07 HISTORY — DX: Post traumatic seizures: R56.1

## 2024-03-07 LAB — HEPATIC FUNCTION PANEL
ALT: 10 U/L (ref 0–44)
AST: 12 U/L — ABNORMAL LOW (ref 15–41)
Albumin: 3.3 g/dL — ABNORMAL LOW (ref 3.5–5.0)
Alkaline Phosphatase: 60 U/L (ref 38–126)
Bilirubin, Direct: 0.2 mg/dL (ref 0.0–0.2)
Indirect Bilirubin: 1.2 mg/dL — ABNORMAL HIGH (ref 0.3–0.9)
Total Bilirubin: 1.4 mg/dL — ABNORMAL HIGH (ref 0.0–1.2)
Total Protein: 7.3 g/dL (ref 6.5–8.1)

## 2024-03-07 LAB — GLUCOSE, CAPILLARY
Glucose-Capillary: 151 mg/dL — ABNORMAL HIGH (ref 70–99)
Glucose-Capillary: 179 mg/dL — ABNORMAL HIGH (ref 70–99)
Glucose-Capillary: 192 mg/dL — ABNORMAL HIGH (ref 70–99)
Glucose-Capillary: 211 mg/dL — ABNORMAL HIGH (ref 70–99)
Glucose-Capillary: 218 mg/dL — ABNORMAL HIGH (ref 70–99)
Glucose-Capillary: 247 mg/dL — ABNORMAL HIGH (ref 70–99)

## 2024-03-07 LAB — BASIC METABOLIC PANEL
Anion gap: 11 (ref 5–15)
BUN: 21 mg/dL — ABNORMAL HIGH (ref 6–20)
CO2: 25 mmol/L (ref 22–32)
Calcium: 9.2 mg/dL (ref 8.9–10.3)
Chloride: 94 mmol/L — ABNORMAL LOW (ref 98–111)
Creatinine, Ser: 0.94 mg/dL (ref 0.44–1.00)
GFR, Estimated: >60 mL/min (ref >60)
Glucose, Bld: 237 mg/dL — ABNORMAL HIGH (ref 70–99)
Potassium: 4.2 mmol/L (ref 3.5–5.1)
Sodium: 130 mmol/L — ABNORMAL LOW (ref 135–145)

## 2024-03-07 LAB — SODIUM
Sodium: 132 mmol/L — ABNORMAL LOW (ref 135–145)
Sodium: 132 mmol/L — ABNORMAL LOW (ref 135–145)
Sodium: 133 mmol/L — ABNORMAL LOW (ref 135–145)

## 2024-03-07 LAB — POCT I-STAT 7, (LYTES, BLD GAS, ICA,H+H)
Acid-Base Excess: 3 mmol/L — ABNORMAL HIGH (ref 0.0–2.0)
Bicarbonate: 28.1 mmol/L — ABNORMAL HIGH (ref 20.0–28.0)
Calcium, Ion: 1.19 mmol/L (ref 1.15–1.40)
HCT: 37 % (ref 36.0–46.0)
Hemoglobin: 12.6 g/dL (ref 12.0–15.0)
O2 Saturation: 100 %
Patient temperature: 98.8
Potassium: 4 mmol/L (ref 3.5–5.1)
Sodium: 132 mmol/L — ABNORMAL LOW (ref 135–145)
TCO2: 29 mmol/L (ref 22–32)
pCO2 arterial: 43.4 mmHg (ref 32–48)
pH, Arterial: 7.421 (ref 7.35–7.45)
pO2, Arterial: 285 mmHg — ABNORMAL HIGH (ref 83–108)

## 2024-03-07 LAB — CBC
HCT: 38.9 % (ref 36.0–46.0)
Hemoglobin: 12.6 g/dL (ref 12.0–15.0)
MCH: 27 pg (ref 26.0–34.0)
MCHC: 32.4 g/dL (ref 30.0–36.0)
MCV: 83.5 fL (ref 80.0–100.0)
Platelets: 250 10*3/uL (ref 150–400)
RBC: 4.66 MIL/uL (ref 3.87–5.11)
RDW: 12.5 % (ref 11.5–15.5)
WBC: 20.3 10*3/uL — ABNORMAL HIGH (ref 4.0–10.5)
nRBC: 0 % (ref 0.0–0.2)

## 2024-03-07 LAB — CK: Total CK: 68 U/L (ref 38–234)

## 2024-03-07 LAB — OSMOLALITY: Osmolality: 294 mosm/kg (ref 275–295)

## 2024-03-07 MED ORDER — SUCCINYLCHOLINE CHLORIDE 200 MG/10ML IV SOSY
PREFILLED_SYRINGE | INTRAVENOUS | Status: AC
  Filled 2024-03-07: qty 10

## 2024-03-07 MED ORDER — LEVETIRACETAM 500 MG PO TABS
1500.0000 mg | ORAL_TABLET | Freq: Two times a day (BID) | ORAL | Status: DC
Start: 2024-03-07 — End: 2024-03-07
  Administered 2024-03-07 (×2): 1500 mg via ORAL
  Filled 2024-03-07: qty 2

## 2024-03-07 MED ORDER — FAMOTIDINE 20 MG PO TABS
20.0000 mg | ORAL_TABLET | Freq: Two times a day (BID) | ORAL | Status: DC
Start: 2024-03-07 — End: 2024-03-10
  Administered 2024-03-07 – 2024-03-09 (×5): 20 mg
  Filled 2024-03-07 (×5): qty 1

## 2024-03-07 MED ORDER — LEVETIRACETAM 500 MG PO TABS
1500.0000 mg | ORAL_TABLET | Freq: Two times a day (BID) | ORAL | Status: AC
  Administered 2024-03-08 – 2024-03-10 (×6): 1500 mg
  Filled 2024-03-07 (×7): qty 3

## 2024-03-07 MED ORDER — LACOSAMIDE 50 MG PO TABS
100.0000 mg | ORAL_TABLET | Freq: Two times a day (BID) | ORAL | Status: DC
Start: 2024-03-07 — End: 2024-03-07

## 2024-03-07 MED ORDER — VALPROATE SODIUM 100 MG/ML IV SOLN
2000.0000 mg | Freq: Once | INTRAVENOUS | Status: AC
  Administered 2024-03-07: 2000 mg via INTRAVENOUS
  Filled 2024-03-07: qty 20

## 2024-03-07 MED ORDER — SODIUM CHLORIDE 0.9 % IV SOLN
75.0000 mL/h | INTRAVENOUS | Status: AC
  Administered 2024-03-07 – 2024-03-08 (×2): 75 mL/h via INTRAVENOUS

## 2024-03-07 MED ORDER — CHLORHEXIDINE GLUCONATE CLOTH 2 % EX PADS
6.0000 | MEDICATED_PAD | Freq: Every day | CUTANEOUS | Status: DC
Start: 2024-03-07 — End: 2024-04-13
  Administered 2024-03-07 – 2024-04-13 (×29): 6 via TOPICAL

## 2024-03-07 MED ORDER — SODIUM CHLORIDE 0.9 % IV SOLN
100.0000 mg | Freq: Two times a day (BID) | INTRAVENOUS | Status: DC
  Filled 2024-03-07 (×3): qty 10

## 2024-03-07 MED ORDER — INSULIN ASPART 100 UNIT/ML IJ SOLN
0.0000 [IU] | INTRAMUSCULAR | Status: DC
  Administered 2024-03-07: 4 [IU] via SUBCUTANEOUS
  Administered 2024-03-08: 3 [IU] via SUBCUTANEOUS
  Administered 2024-03-08 – 2024-03-09 (×3): 4 [IU] via SUBCUTANEOUS
  Administered 2024-03-09: 3 [IU] via SUBCUTANEOUS
  Administered 2024-03-09: 7 [IU] via SUBCUTANEOUS
  Administered 2024-03-09: 4 [IU] via SUBCUTANEOUS
  Administered 2024-03-09: 7 [IU] via SUBCUTANEOUS
  Administered 2024-03-10 (×2): 4 [IU] via SUBCUTANEOUS
  Administered 2024-03-10 (×2): 7 [IU] via SUBCUTANEOUS
  Administered 2024-03-10: 15 [IU] via SUBCUTANEOUS
  Administered 2024-03-10 (×2): 7 [IU] via SUBCUTANEOUS
  Administered 2024-03-11: 11 [IU] via SUBCUTANEOUS
  Administered 2024-03-11: 4 [IU] via SUBCUTANEOUS
  Administered 2024-03-11: 11 [IU] via SUBCUTANEOUS
  Administered 2024-03-11: 7 [IU] via SUBCUTANEOUS
  Administered 2024-03-11: 11 [IU] via SUBCUTANEOUS
  Administered 2024-03-12: 15 [IU] via SUBCUTANEOUS
  Administered 2024-03-12: 7 [IU] via SUBCUTANEOUS
  Administered 2024-03-12: 11 [IU] via SUBCUTANEOUS
  Administered 2024-03-12: 15 [IU] via SUBCUTANEOUS
  Administered 2024-03-12: 7 [IU] via SUBCUTANEOUS
  Administered 2024-03-12 (×2): 11 [IU] via SUBCUTANEOUS
  Administered 2024-03-13 (×3): 4 [IU] via SUBCUTANEOUS
  Administered 2024-03-13 (×3): 7 [IU] via SUBCUTANEOUS
  Administered 2024-03-14 (×2): 4 [IU] via SUBCUTANEOUS
  Administered 2024-03-14: 3 [IU] via SUBCUTANEOUS
  Administered 2024-03-14: 4 [IU] via SUBCUTANEOUS
  Administered 2024-03-15 (×2): 3 [IU] via SUBCUTANEOUS
  Administered 2024-03-15 (×2): 4 [IU] via SUBCUTANEOUS
  Administered 2024-03-16 (×2): 3 [IU] via SUBCUTANEOUS
  Administered 2024-03-16 (×2): 4 [IU] via SUBCUTANEOUS
  Administered 2024-03-16 – 2024-03-17 (×2): 3 [IU] via SUBCUTANEOUS
  Administered 2024-03-17: 4 [IU] via SUBCUTANEOUS
  Administered 2024-03-17 (×3): 3 [IU] via SUBCUTANEOUS
  Administered 2024-03-18 (×6): 4 [IU] via SUBCUTANEOUS
  Administered 2024-03-18: 3 [IU] via SUBCUTANEOUS
  Administered 2024-03-19: 4 [IU] via SUBCUTANEOUS
  Administered 2024-03-19: 3 [IU] via SUBCUTANEOUS
  Administered 2024-03-19 – 2024-03-20 (×8): 4 [IU] via SUBCUTANEOUS
  Administered 2024-03-21: 7 [IU] via SUBCUTANEOUS
  Administered 2024-03-21 (×3): 4 [IU] via SUBCUTANEOUS
  Administered 2024-03-21: 7 [IU] via SUBCUTANEOUS
  Administered 2024-03-21 – 2024-03-22 (×2): 4 [IU] via SUBCUTANEOUS
  Administered 2024-03-22: 7 [IU] via SUBCUTANEOUS
  Administered 2024-03-22 – 2024-03-23 (×7): 4 [IU] via SUBCUTANEOUS
  Administered 2024-03-23: 7 [IU] via SUBCUTANEOUS
  Administered 2024-03-23 – 2024-03-24 (×3): 4 [IU] via SUBCUTANEOUS
  Administered 2024-03-24 (×2): 3 [IU] via SUBCUTANEOUS
  Administered 2024-03-24: 5 [IU] via SUBCUTANEOUS
  Administered 2024-03-24 – 2024-03-26 (×8): 4 [IU] via SUBCUTANEOUS
  Administered 2024-03-26: 3 [IU] via SUBCUTANEOUS
  Administered 2024-03-26: 4 [IU] via SUBCUTANEOUS
  Administered 2024-03-26: 3 [IU] via SUBCUTANEOUS
  Administered 2024-03-26: 4 [IU] via SUBCUTANEOUS
  Administered 2024-03-26: 7 [IU] via SUBCUTANEOUS
  Administered 2024-03-27 (×5): 4 [IU] via SUBCUTANEOUS
  Administered 2024-03-27 – 2024-03-28 (×2): 3 [IU] via SUBCUTANEOUS
  Administered 2024-03-28: 7 [IU] via SUBCUTANEOUS
  Administered 2024-03-28: 3 [IU] via SUBCUTANEOUS
  Administered 2024-03-28: 7 [IU] via SUBCUTANEOUS
  Administered 2024-03-28: 4 [IU] via SUBCUTANEOUS
  Administered 2024-03-28: 7 [IU] via SUBCUTANEOUS
  Administered 2024-03-29: 3 [IU] via SUBCUTANEOUS
  Administered 2024-03-29: 4 [IU] via SUBCUTANEOUS
  Administered 2024-03-29 (×2): 7 [IU] via SUBCUTANEOUS
  Administered 2024-03-29: 3 [IU] via SUBCUTANEOUS
  Administered 2024-03-29 (×2): 7 [IU] via SUBCUTANEOUS
  Administered 2024-03-30 – 2024-03-31 (×5): 4 [IU] via SUBCUTANEOUS
  Administered 2024-03-31: 7 [IU] via SUBCUTANEOUS
  Administered 2024-03-31: 3 [IU] via SUBCUTANEOUS
  Administered 2024-03-31: 7 [IU] via SUBCUTANEOUS
  Administered 2024-03-31: 3 [IU] via SUBCUTANEOUS
  Administered 2024-04-01: 4 [IU] via SUBCUTANEOUS
  Administered 2024-04-01: 15 [IU] via SUBCUTANEOUS
  Administered 2024-04-01: 4 [IU] via SUBCUTANEOUS
  Administered 2024-04-01: 7 [IU] via SUBCUTANEOUS
  Administered 2024-04-01: 4 [IU] via SUBCUTANEOUS
  Administered 2024-04-02 (×3): 3 [IU] via SUBCUTANEOUS
  Administered 2024-04-02 – 2024-04-03 (×4): 4 [IU] via SUBCUTANEOUS
  Administered 2024-04-03: 3 [IU] via SUBCUTANEOUS
  Administered 2024-04-03: 11 [IU] via SUBCUTANEOUS
  Administered 2024-04-03: 4 [IU] via SUBCUTANEOUS
  Administered 2024-04-04: 3 [IU] via SUBCUTANEOUS
  Administered 2024-04-04: 4 [IU] via SUBCUTANEOUS
  Administered 2024-04-04: 7 [IU] via SUBCUTANEOUS
  Administered 2024-04-04: 11 [IU] via SUBCUTANEOUS
  Administered 2024-04-04: 7 [IU] via SUBCUTANEOUS
  Administered 2024-04-05: 3 [IU] via SUBCUTANEOUS
  Administered 2024-04-05: 4 [IU] via SUBCUTANEOUS

## 2024-03-07 MED ORDER — MIDAZOLAM BOLUS VIA INFUSION
4.0000 mg | Freq: Once | INTRAVENOUS | Status: AC
  Administered 2024-03-07: 4 mg via INTRAVENOUS
  Filled 2024-03-07: qty 4

## 2024-03-07 MED ORDER — ORAL CARE MOUTH RINSE: 15.0000 mL | OROMUCOSAL | Status: DC

## 2024-03-07 MED ORDER — MIDAZOLAM HCL 2 MG/2ML IJ SOLN
INTRAMUSCULAR | Status: AC
Start: 2024-03-07 — End: 2024-03-07
  Filled 2024-03-07: qty 2

## 2024-03-07 MED ORDER — FENTANYL CITRATE PF 50 MCG/ML IJ SOSY
50.0000 ug | PREFILLED_SYRINGE | INTRAMUSCULAR | Status: AC
  Administered 2024-03-07: 50 ug via INTRAVENOUS

## 2024-03-07 MED ORDER — MIDAZOLAM HCL 2 MG/2ML IJ SOLN
1.0000 mg | INTRAMUSCULAR | Status: DC | PRN
  Administered 2024-03-08: 2 mg via INTRAVENOUS

## 2024-03-07 MED ORDER — SUCCINYLCHOLINE CHLORIDE 200 MG/10ML IV SOSY
PREFILLED_SYRINGE | INTRAVENOUS | Status: AC
Start: 2024-03-07 — End: 2024-03-07
  Filled 2024-03-07: qty 10

## 2024-03-07 MED ORDER — SODIUM CHLORIDE 3 % IV SOLN: INTRAVENOUS | Status: DC

## 2024-03-07 MED ORDER — ROCURONIUM BROMIDE 10 MG/ML (PF) SYRINGE
PREFILLED_SYRINGE | INTRAVENOUS | Status: AC
  Filled 2024-03-07: qty 10

## 2024-03-07 MED ORDER — MIDAZOLAM HCL 2 MG/2ML IJ SOLN: 4.0000 mg | INTRAMUSCULAR | Status: AC

## 2024-03-07 MED ORDER — LORAZEPAM 2 MG/ML IJ SOLN
INTRAMUSCULAR | Status: AC
Start: 2024-03-07 — End: 2024-03-07
  Administered 2024-03-07: 4 mg via INTRAVENOUS
  Filled 2024-03-07: qty 2

## 2024-03-07 MED ORDER — GABAPENTIN 250 MG/5ML PO SOLN
300.0000 mg | Freq: Three times a day (TID) | ORAL | Status: DC
  Administered 2024-03-07 – 2024-03-23 (×48): 300 mg
  Filled 2024-03-07 (×52): qty 6

## 2024-03-07 MED ORDER — POLYETHYLENE GLYCOL 3350 17 G PO PACK
17.0000 g | PACK | Freq: Every day | ORAL | Status: DC | PRN
  Administered 2024-03-09: 17 g

## 2024-03-07 MED ORDER — KETAMINE HCL 50 MG/5ML IJ SOSY
PREFILLED_SYRINGE | INTRAMUSCULAR | Status: AC
  Filled 2024-03-07: qty 10

## 2024-03-07 MED ORDER — SUCCINYLCHOLINE CHLORIDE 200 MG/10ML IV SOSY: 100.0000 mg | PREFILLED_SYRINGE | Freq: Once | INTRAVENOUS | Status: DC

## 2024-03-07 MED ORDER — ORAL CARE MOUTH RINSE: 15.0000 mL | OROMUCOSAL | Status: DC | PRN

## 2024-03-07 MED ORDER — MIDAZOLAM HCL 2 MG/2ML IJ SOLN
INTRAMUSCULAR | Status: AC
  Administered 2024-03-07: 4 mg via INTRAVENOUS
  Filled 2024-03-07: qty 2

## 2024-03-07 MED ORDER — MIDAZOLAM HCL 2 MG/2ML IJ SOLN
INTRAMUSCULAR | Status: DC
Start: 2024-03-07 — End: 2024-03-07
  Filled 2024-03-07: qty 2

## 2024-03-07 MED ORDER — SENNA 8.6 MG PO TABS
1.0000 | ORAL_TABLET | Freq: Two times a day (BID) | ORAL | Status: DC
  Administered 2024-03-07: 8.6 mg via ORAL

## 2024-03-07 MED ORDER — SODIUM CHLORIDE 0.9 % IV SOLN
3000.0000 mg | Freq: Once | INTRAVENOUS | Status: AC
  Administered 2024-03-07: 3000 mg via INTRAVENOUS
  Filled 2024-03-07: qty 30

## 2024-03-07 MED ORDER — FAMOTIDINE 20 MG PO TABS: 20.0000 mg | ORAL_TABLET | Freq: Two times a day (BID) | ORAL | Status: DC

## 2024-03-07 MED ORDER — SODIUM CHLORIDE 0.9 % IV SOLN: 200.0000 mg | Freq: Once | INTRAVENOUS | Status: DC

## 2024-03-07 MED ORDER — ETOMIDATE 2 MG/ML IV SOLN: 10.0000 mg | Freq: Once | INTRAVENOUS | Status: DC

## 2024-03-07 MED ORDER — SENNA 8.6 MG PO TABS
1.0000 | ORAL_TABLET | Freq: Two times a day (BID) | ORAL | Status: DC
  Administered 2024-03-08 – 2024-03-11 (×6): 8.6 mg
  Filled 2024-03-07 (×7): qty 1

## 2024-03-07 MED ORDER — FENTANYL CITRATE PF 50 MCG/ML IJ SOSY
25.0000 ug | PREFILLED_SYRINGE | INTRAMUSCULAR | Status: DC | PRN
  Administered 2024-03-07 – 2024-03-08 (×2): 100 ug via INTRAVENOUS
  Administered 2024-03-12 – 2024-03-19 (×3): 50 ug via INTRAVENOUS
  Filled 2024-03-07: qty 1
  Filled 2024-03-07 (×2): qty 2
  Filled 2024-03-07 (×2): qty 1

## 2024-03-07 MED ORDER — LORAZEPAM 2 MG/ML IJ SOLN
INTRAMUSCULAR | Status: AC
  Administered 2024-03-07: 2 mg
  Filled 2024-03-07: qty 1

## 2024-03-07 MED ORDER — MIDAZOLAM HCL 2 MG/2ML IJ SOLN: 4.0000 mg | Freq: Once | INTRAMUSCULAR | Status: DC

## 2024-03-07 MED ORDER — SODIUM CHLORIDE 0.9 % IV SOLN
100.0000 mg | Freq: Two times a day (BID) | INTRAVENOUS | Status: DC
  Filled 2024-03-07: qty 10

## 2024-03-07 MED ORDER — MIDAZOLAM BOLUS VIA INFUSION: 4.0000 mg | INTRAVENOUS | Status: DC | PRN

## 2024-03-07 MED ORDER — FENTANYL CITRATE PF 50 MCG/ML IJ SOSY: 50.0000 ug | PREFILLED_SYRINGE | INTRAMUSCULAR | Status: DC | PRN

## 2024-03-07 MED ORDER — FENTANYL CITRATE PF 50 MCG/ML IJ SOSY: 50.0000 ug | PREFILLED_SYRINGE | Freq: Once | INTRAMUSCULAR | Status: DC

## 2024-03-07 MED ORDER — FENTANYL CITRATE PF 50 MCG/ML IJ SOSY
PREFILLED_SYRINGE | INTRAMUSCULAR | Status: AC
  Filled 2024-03-07: qty 2

## 2024-03-07 MED ORDER — LORAZEPAM 2 MG/ML IJ SOLN: 1.0000 mg | Freq: Once | INTRAMUSCULAR | Status: DC

## 2024-03-07 MED ORDER — SODIUM CHLORIDE 0.9 % IV SOLN
200.0000 mg | Freq: Once | INTRAVENOUS | Status: AC
  Administered 2024-03-07: 200 mg via INTRAVENOUS
  Filled 2024-03-07: qty 20

## 2024-03-07 MED ORDER — POLYETHYLENE GLYCOL 3350 17 G PO PACK: 17.0000 g | PACK | Freq: Every day | ORAL | Status: DC | PRN

## 2024-03-07 MED ORDER — FUROSEMIDE 10 MG/ML IJ SOLN: 40.0000 mg | Freq: Once | INTRAMUSCULAR | Status: DC

## 2024-03-07 MED ORDER — INSULIN GLARGINE-YFGN 100 UNIT/ML ~~LOC~~ SOLN
18.0000 [IU] | Freq: Every day | SUBCUTANEOUS | Status: DC
  Administered 2024-03-07: 18 [IU] via SUBCUTANEOUS
  Filled 2024-03-07: qty 0.18

## 2024-03-07 MED ORDER — LACOSAMIDE 50 MG PO TABS: 100.0000 mg | ORAL_TABLET | Freq: Two times a day (BID) | ORAL | Status: DC

## 2024-03-07 MED ORDER — ETOMIDATE 2 MG/ML IV SOLN
10.0000 mg | INTRAVENOUS | Status: AC
  Administered 2024-03-07: 10 mg via INTRAVENOUS

## 2024-03-07 MED ORDER — ETOMIDATE 2 MG/ML IV SOLN
INTRAVENOUS | Status: AC
  Filled 2024-03-07: qty 10

## 2024-03-07 MED ORDER — SUCCINYLCHOLINE CHLORIDE 200 MG/10ML IV SOSY
100.0000 mg | PREFILLED_SYRINGE | INTRAVENOUS | Status: AC
  Administered 2024-03-07: 100 mg via INTRAVENOUS

## 2024-03-07 MED ORDER — ETOMIDATE 2 MG/ML IV SOLN
INTRAVENOUS | Status: DC
Start: 2024-03-07 — End: 2024-03-07
  Filled 2024-03-07: qty 20

## 2024-03-07 MED ORDER — SODIUM CHLORIDE 0.9 % IV SOLN: 4500.0000 mg | Freq: Once | INTRAVENOUS | Status: DC

## 2024-03-07 MED ORDER — CLEVIDIPINE BUTYRATE 0.5 MG/ML IV EMUL: 1.0000 mg/h | INTRAVENOUS | Status: DC

## 2024-03-07 MED ORDER — MIDAZOLAM-SODIUM CHLORIDE 100-0.9 MG/100ML-% IV SOLN
10.0000 mg/h | INTRAVENOUS | Status: DC
  Administered 2024-03-07: 4 mg/h via INTRAVENOUS
  Administered 2024-03-08: 10 mg/h via INTRAVENOUS
  Administered 2024-03-08 – 2024-03-09 (×3): 15 mg/h via INTRAVENOUS
  Administered 2024-03-09: 20 mg/h via INTRAVENOUS
  Administered 2024-03-09: 15 mg/h via INTRAVENOUS
  Administered 2024-03-10 – 2024-03-12 (×11): 20 mg/h via INTRAVENOUS
  Filled 2024-03-07: qty 200
  Filled 2024-03-07 (×16): qty 100

## 2024-03-07 MED ORDER — LACOSAMIDE 200 MG PO TABS
200.0000 mg | ORAL_TABLET | Freq: Two times a day (BID) | ORAL | Status: DC
  Administered 2024-03-07 – 2024-03-23 (×31): 200 mg
  Filled 2024-03-07 (×16): qty 4
  Filled 2024-03-07: qty 1
  Filled 2024-03-07 (×2): qty 4
  Filled 2024-03-07: qty 1
  Filled 2024-03-07 (×12): qty 4

## 2024-03-07 MED ORDER — LORAZEPAM 2 MG/ML IJ SOLN: 4.0000 mg | Freq: Once | INTRAMUSCULAR | Status: AC

## 2024-03-07 MED ORDER — MIDAZOLAM HCL 2 MG/2ML IJ SOLN
INTRAMUSCULAR | Status: AC
  Filled 2024-03-07: qty 2

## 2024-03-07 MED ORDER — MIDAZOLAM HCL 2 MG/2ML IJ SOLN: 4.0000 mg | INTRAMUSCULAR | Status: DC | PRN

## 2024-03-07 MED ORDER — SODIUM CHLORIDE 0.9 % IV SOLN
200.0000 mg | Freq: Two times a day (BID) | INTRAVENOUS | Status: DC
  Administered 2024-03-22: 200 mg via INTRAVENOUS
  Filled 2024-03-07 (×5): qty 20

## 2024-03-07 NOTE — Progress Notes (Signed)
 OT Cancellation Note  Patient Details Name: [name] MRN: [id number] DOB: Feb 02, 1966   Cancelled Treatment:    Reason Eval/Treat Not Completed: Medical issues which prohibited therapy (Nurse reports continued seizures while in bed and requested hold; planned overnight EEG)  Kevan Ny, MOTS  Kevan Ny 03/07/2024, 10:24 AM
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
## 2024-03-07 NOTE — Progress Notes (Addendum)
 Moved pt from 3w29 to 42m11. [facility] monitoring, push button "event" tested
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
## 2024-03-07 NOTE — Inpatient Diabetes Management (Signed)
 Inpatient Diabetes Program Recommendations  AACE/ADA: New Consensus Statement on Inpatient Glycemic Control (2015)  Target Ranges:  Prepandial:   less than 140 mg/dL      Peak postprandial:   less than 180 mg/dL (1-2 hours)      Critically ill patients:  140 - 180 mg/dL   Lab Results  Component Value Date   GLUCAP 247 (H) 03/07/2024   HGBA1C 11.3 (H) 03/02/2024    Review of Glycemic Control  Latest Reference Range & Units 03/06/24 06:21 03/06/24 09:48 03/06/24 12:33 03/06/24 17:05 03/06/24 21:22 03/07/24 06:17  Glucose-Capillary 70 - 99 mg/dL 086 (H) 578 (H) 469 (H) 234 (H) 315 (H) 247 (H)    Diabetes history: DM 2 Outpatient Diabetes medications: Trulicity 3 mg weekly, Lantus 25 units Daily Current orders for Inpatient glycemic control:  Semglee 18 units Daily (increased today) Novolog 0-15 units tid + hs Novolog 3 units tid meal coverage  Ensure Enlive bid between meals (40 grams of carbohydrates) A1c 11.3% on 3/21  Inpatient Diabetes Program Recommendations:    -   Increase Novolog meal coverage to 8 units tid    Thanks,  Christena Deem RN, MSN, BC-ADM Inpatient Diabetes Coordinator Team Pager [phone] (8a-5p)
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
## 2024-03-07 NOTE — Progress Notes (Signed)
 0930 Patient having multiple focal seizures lasting anywhere from 30-90 seconds.  Dr.  Annie Paras made aware.  Increased Keppra. Patient is not responsive during episodes.  Patient has nystagmus during episodes but no notable myoclonus. 1025 Patient has had up to 6 focal seizures now.  Dr. Annie Paras made aware.  Contacting neurology.

## 2024-03-07 NOTE — Progress Notes (Signed)
 LTM EEG hooked up and running - no initial skin breakdown - push button tested - [facility] monitoring.  Md notified

## 2024-03-07 NOTE — Procedures (Signed)
 Intubation Procedure Note  LALA BEEN  [id number]  11-18-1966  Date:03/07/24  Time:5:07 PM   Provider Performing:Joshua Zeringue Gomez-Caraballo    Procedure: Intubation (31500)  Indication(s) Respiratory Failure  Consent Unable to obtain consent due to emergent nature of procedure.   Anesthesia Etomidate, Versed, and Succinylcholine   Time Out Verified patient identification, verified procedure, site/side was marked, verified correct patient position, special equipment/implants available, medications/allergies/relevant history reviewed, required imaging and test results available.   Sterile Technique Usual hand hygeine, masks, and gloves were used   Procedure Description Patient positioned in bed supine.  Sedation given as noted above.  Patient was intubated with endotracheal tube using Glidescope.  View was Grade 3 only epiglottis .  Number of attempts was 1.  Colorimetric CO2 detector was consistent with tracheal placement.   Complications/Tolerance None; patient tolerated the procedure well. Chest X-ray is ordered to verify placement.   EBL Minimal   Specimen(s) None

## 2024-03-07 NOTE — Progress Notes (Signed)
 Pt transported to/from CT on the vent with RN and Transport without incident.

## 2024-03-08 ENCOUNTER — Encounter (HOSPITAL_COMMUNITY)

## 2024-03-08 DIAGNOSIS — G40901 Epilepsy, unspecified, not intractable, with status epilepticus: Secondary | ICD-10-CM | POA: Diagnosis not present

## 2024-03-08 DIAGNOSIS — I6200 Nontraumatic subdural hemorrhage, unspecified: Secondary | ICD-10-CM | POA: Diagnosis not present

## 2024-03-08 DIAGNOSIS — J9601 Acute respiratory failure with hypoxia: Secondary | ICD-10-CM | POA: Diagnosis not present

## 2024-03-08 DIAGNOSIS — R569 Unspecified convulsions: Secondary | ICD-10-CM | POA: Diagnosis not present

## 2024-03-08 DIAGNOSIS — D72829 Elevated white blood cell count, unspecified: Secondary | ICD-10-CM | POA: Diagnosis not present

## 2024-03-08 DIAGNOSIS — S065XAA Traumatic subdural hemorrhage with loss of consciousness status unknown, initial encounter: Secondary | ICD-10-CM | POA: Diagnosis not present

## 2024-03-08 LAB — CBC WITH DIFFERENTIAL/PLATELET
Abs Immature Granulocytes: 0 10*3/uL (ref 0.00–0.07)
Basophils Absolute: 0 10*3/uL (ref 0.0–0.1)
Basophils Relative: 0 %
Eosinophils Absolute: 0 10*3/uL (ref 0.0–0.5)
Eosinophils Relative: 0 %
HCT: 40.3 % (ref 36.0–46.0)
Hemoglobin: 12.6 g/dL (ref 12.0–15.0)
Lymphocytes Relative: 37 %
Lymphs Abs: 7.7 10*3/uL — ABNORMAL HIGH (ref 0.7–4.0)
MCH: 26.5 pg (ref 26.0–34.0)
MCHC: 31.3 g/dL (ref 30.0–36.0)
MCV: 84.7 fL (ref 80.0–100.0)
Monocytes Absolute: 2.3 10*3/uL — ABNORMAL HIGH (ref 0.1–1.0)
Monocytes Relative: 11 %
Neutro Abs: 10.8 10*3/uL — ABNORMAL HIGH (ref 1.7–7.7)
Neutrophils Relative %: 52 %
Platelets: 177 10*3/uL (ref 150–400)
RBC: 4.76 MIL/uL (ref 3.87–5.11)
RDW: 12.5 % (ref 11.5–15.5)
WBC: 20.7 10*3/uL — ABNORMAL HIGH (ref 4.0–10.5)
nRBC: 0 % (ref 0.0–0.2)
nRBC: 0 /100{WBCs}

## 2024-03-08 LAB — COMPREHENSIVE METABOLIC PANEL WITH GFR
ALT: 10 U/L (ref 0–44)
AST: 18 U/L (ref 15–41)
Albumin: 3.4 g/dL — ABNORMAL LOW (ref 3.5–5.0)
Alkaline Phosphatase: 57 U/L (ref 38–126)
Anion gap: 12 (ref 5–15)
BUN: 21 mg/dL — ABNORMAL HIGH (ref 6–20)
CO2: 23 mmol/L (ref 22–32)
Calcium: 8.9 mg/dL (ref 8.9–10.3)
Chloride: 99 mmol/L (ref 98–111)
Creatinine, Ser: 1.15 mg/dL — ABNORMAL HIGH (ref 0.44–1.00)
GFR, Estimated: 56 mL/min — ABNORMAL LOW (ref >60)
Glucose, Bld: 116 mg/dL — ABNORMAL HIGH (ref 70–99)
Potassium: 3.8 mmol/L (ref 3.5–5.1)
Sodium: 134 mmol/L — ABNORMAL LOW (ref 135–145)
Total Bilirubin: 1.3 mg/dL — ABNORMAL HIGH (ref 0.0–1.2)
Total Protein: 7.2 g/dL (ref 6.5–8.1)

## 2024-03-08 LAB — MAGNESIUM: Magnesium: 2.1 mg/dL (ref 1.7–2.4)

## 2024-03-08 LAB — GLUCOSE, CAPILLARY
Glucose-Capillary: 108 mg/dL — ABNORMAL HIGH (ref 70–99)
Glucose-Capillary: 110 mg/dL — ABNORMAL HIGH (ref 70–99)
Glucose-Capillary: 116 mg/dL — ABNORMAL HIGH (ref 70–99)
Glucose-Capillary: 118 mg/dL — ABNORMAL HIGH (ref 70–99)
Glucose-Capillary: 124 mg/dL — ABNORMAL HIGH (ref 70–99)
Glucose-Capillary: 150 mg/dL — ABNORMAL HIGH (ref 70–99)

## 2024-03-08 LAB — SODIUM
Sodium: 134 mmol/L — ABNORMAL LOW (ref 135–145)
Sodium: 133 mmol/L — ABNORMAL LOW (ref 135–145)
Sodium: 135 mmol/L (ref 135–145)
Sodium: 134 mmol/L — ABNORMAL LOW (ref 135–145)

## 2024-03-08 LAB — MRSA NEXT GEN BY PCR, NASAL: MRSA by PCR Next Gen: DETECTED — AB

## 2024-03-08 LAB — VALPROIC ACID LEVEL: Valproic Acid Lvl: 78 ug/mL (ref 50.0–100.0)

## 2024-03-08 MED ORDER — ACETAMINOPHEN 325 MG PO TABS
650.0000 mg | ORAL_TABLET | Freq: Three times a day (TID) | ORAL | Status: DC | PRN
  Administered 2024-03-11 – 2024-03-19 (×13): 650 mg
  Filled 2024-03-08 (×13): qty 2

## 2024-03-08 MED ORDER — POLYETHYLENE GLYCOL 3350 17 G PO PACK
17.0000 g | PACK | Freq: Two times a day (BID) | ORAL | Status: DC
  Administered 2024-03-08 – 2024-03-11 (×6): 17 g
  Filled 2024-03-08 (×6): qty 1

## 2024-03-08 MED ORDER — THIAMINE MONONITRATE 100 MG PO TABS
100.0000 mg | ORAL_TABLET | Freq: Every day | ORAL | Status: AC
  Administered 2024-03-09 – 2024-03-14 (×6): 100 mg
  Filled 2024-03-08 (×6): qty 1

## 2024-03-08 MED ORDER — VALPROATE SODIUM 100 MG/ML IV SOLN
500.0000 mg | Freq: Three times a day (TID) | INTRAVENOUS | Status: DC
  Administered 2024-03-08 – 2024-03-13 (×16): 500 mg via INTRAVENOUS
  Filled 2024-03-08 (×6): qty 500
  Filled 2024-03-08: qty 5
  Filled 2024-03-08 (×3): qty 500
  Filled 2024-03-08 (×3): qty 5
  Filled 2024-03-08 (×2): qty 500
  Filled 2024-03-08: qty 5
  Filled 2024-03-08: qty 500
  Filled 2024-03-08 (×3): qty 5

## 2024-03-08 MED ORDER — SODIUM CHLORIDE 0.9 % IV SOLN
75.0000 mL/h | INTRAVENOUS | Status: AC
  Administered 2024-03-09 (×3): 75 mL/h via INTRAVENOUS

## 2024-03-08 MED ORDER — OSMOLITE 1.5 CAL PO LIQD
1000.0000 mL | ORAL | Status: DC
  Administered 2024-03-08 – 2024-03-12 (×5): 1000 mL
  Filled 2024-03-08 (×6): qty 1000

## 2024-03-08 MED ORDER — PROSOURCE TF20 ENFIT COMPATIBL EN LIQD
60.0000 mL | Freq: Every day | ENTERAL | Status: DC
  Administered 2024-03-08 – 2024-03-11 (×4): 60 mL
  Filled 2024-03-08 (×4): qty 60

## 2024-03-08 MED ORDER — PROPOFOL 1000 MG/100ML IV EMUL
40.0000 ug/kg/min | INTRAVENOUS | Status: DC
Start: 2024-03-08 — End: 2024-03-12
  Administered 2024-03-08: 5 ug/kg/min via INTRAVENOUS
  Administered 2024-03-09: 25 ug/kg/min via INTRAVENOUS
  Administered 2024-03-09: 20 ug/kg/min via INTRAVENOUS
  Administered 2024-03-09: 10 ug/kg/min via INTRAVENOUS
  Administered 2024-03-10 – 2024-03-11 (×9): 40 ug/kg/min via INTRAVENOUS
  Administered 2024-03-11: 30 ug/kg/min via INTRAVENOUS
  Administered 2024-03-11: 40 ug/kg/min via INTRAVENOUS
  Filled 2024-03-08 (×12): qty 100
  Filled 2024-03-08: qty 300
  Filled 2024-03-08: qty 100

## 2024-03-08 MED ORDER — ORAL CARE MOUTH RINSE
15.0000 mL | OROMUCOSAL | Status: DC
  Administered 2024-03-08 – 2024-03-20 (×147): 15 mL via OROMUCOSAL

## 2024-03-08 MED ORDER — ENOXAPARIN SODIUM 60 MG/0.6ML IJ SOSY
50.0000 mg | PREFILLED_SYRINGE | INTRAMUSCULAR | Status: DC
  Administered 2024-03-08 – 2024-03-09 (×2): 50 mg via SUBCUTANEOUS
  Filled 2024-03-08 (×3): qty 0.6

## 2024-03-08 MED ORDER — ORAL CARE MOUTH RINSE: 15.0000 mL | OROMUCOSAL | Status: DC | PRN

## 2024-03-08 NOTE — Procedures (Signed)
 Patient Name: [name]  MRN: [id number]  Epilepsy Attending: Charlsie Quest  Referring Physician/Provider: Gordy Councilman, MD  Duration: 03/07/2024 1502 to 03/08/2024 1502  Patient history: 58 y.o. 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, concern for focal status epilepticus this morning. EEG to evaluate for seizure  Level of alertness: comatose  AEDs during EEG study: LEV, LCM, VPA, versed  Technical aspects: This EEG study was done with scalp electrodes positioned according to the 10-20 International system of electrode placement. Electrical activity was reviewed with band pass filter of 1-70Hz , sensitivity of 7 uV/mm, display speed of 45mm/sec with a 60Hz  notched filter applied as appropriate. EEG data were recorded continuously and digitally stored.  Video monitoring was available and reviewed as appropriate.  Description: EEG showed continuous generalized and lateralized right hemisphere 3 to 6 Hz theta-delta slowing admixed with 12 to 15 Hz with activity  Seizures were noted arising from right parieto-occipital region.  At the beginning of the study, during seizures patient was noted to have head turn to left, eye deviation to left followed by horizontal nystagmus.  Gradually, seizures continued but no clinical signs were noted.  At the onset EEG showed low-amplitude 13 to 15 Hz beta activity which then became high amplitude and appeared more sharply contoured.  EEG then involved all of right hemisphere followed by vertex region as well as left hemisphere.  Subsequently it evolved into 3 to 5 Hz theta-delta slowing.  At the beginning of the study 6-7 seizures were noted per hour lasting 1.5 to 2 minutes each.  Gradually as medications were adjusted, seizure frequency improved to 4 seizures per hour lasting about 1.5 to 2 minutes each.  Versed drip rate was further adjusted after which the frequency of seizures  increased to 10 seizures per hour, however the duration improved to 30 seconds on average and seizures involve only right hemisphere.  Hyperventilation and photic stimulation were not performed.     ABNORMALITY -Focal motor seizures, right parieto-occipital region -Continuous slow, generalized and lateralized right hemisphere  IMPRESSION: This study initially showed focal motor seizures as described above arising from right parieto-occipital region.  Gradually as medications were adjusted, seizures continued but no clinical signs were noted.  Additionally, the beginning of the study 6-7 seizures were noted per hour lasting 1.5 to 2 minutes each.  Gradually as medications were adjusted, seizure frequency improved to 4 seizures per hour lasting about 1.5 to 2 minutes each. Versed drip rate was further adjusted after which the frequency of seizures increased to 10 seizures per hour, however the duration improved to 30 seconds on average and seizures involve only right hemisphere.  There is evidence of cortical dysfunction in right hemisphere likely secondary to underlying structural abnormality.  Lastly there is severe diffuse encephalopathy.  Dr.Bhagat and Dr Derry Lory were notified intermittently  [name]

## 2024-03-08 NOTE — Progress Notes (Signed)
 EEG maint complete. No skin breakdown at fp1 fp2 f8

## 2024-03-08 NOTE — Progress Notes (Addendum)
 Initial Nutrition Assessment  DOCUMENTATION CODES:  Obesity unspecified  INTERVENTION:  Initiate tube feeding via OG tube: begin at 71ml/h and advance 10ml every 24 hours until goal rate of 73ml/h reached Osmolite 1.5 at 55 ml/h (1320 ml per day)  Prosource TF20 60 ml 1x/day  Provides 2060 kcal, 102 gm protein, 1005 ml free water daily  Pt at high refeed risk; recommend monitor potassium, magnesium and phosphorus labs daily until stable  Daily thiamine 100mg  supplementation  Discussed bowel regimen with RN and will monitor any output  Discussed insulin coverage for tube feeds with diabetes care coordinator and pharmacist  NUTRITION DIAGNOSIS:  Inadequate oral intake related to inability to eat as evidenced by NPO status.  GOAL:  Patient will meet greater than or equal to 90% of their needs  MONITOR:   TF tolerance, Labs, Vent status  REASON FOR ASSESSMENT:   Consult Enteral/tube feeding initiation and management  ASSESSMENT:   Pt with hx of prior CVA, seizures, CHF, HTN, T2DM, OSA on CPAP, and GERD. 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 Admitted from home after progressive headaches and vomiting. 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  3/21 admitted to ICU for SDH, on cleviprex 3/24 off cleviprex, transferred out of ICU to IMTS 3/25 seizure activity 3/26 re-admitted to MICU, ventilated 3/27 EEG, lasix for diuresis  Monitoring on EEG for seizure activity. Pt continues to have seizures and requires sedation and heavy seizure medications. Pt discussed during rounds with RN and MD. Plan to continue to monitor but begin with tube feeding at a low rate while increasing slowly each day.  Pt sedated and ventilated at time of assessment. No family present at bedside during assessment. Unable to obtain diet or weight hx. Nutrition focused physical exam shows adequate fat and muscle stores, pt appears to be well nourished at baseline. Pt has not  eaten sufficiently since admission (6 days) and is now at a refeeding risk, will continue to monitor electrolyte labs and supplement with thiamine 100mg  daily. Spoke with RN about bowel regimen, he suspects pt had a bowel movement earlier in admission but it was never charted since pt does not display signs of constipation (abdomen soft, and pt has not had PO intake in 4 days). RN will continue bowel regimen but will monitor closely. Glucose levels have been between 108-247mg /dL over the last 24 hours prior to initiation of tube feeds, reached out to diabetes care coordinator and pharmacist to discuss additional insulin coverage that pt may need for tube feeds.  Per chart review, pt required assistance for ADLs at baseline and used a cane/wheelchair for mobility.  Patient is currently intubated on ventilator support MV: 9.7 L/min Temp (24hrs), Avg:99.4 F (37.4 C), Min:98.2 F (36.8 C), Max:100.4 F (38 C)  MAP (cuff):  Admit weight: 114.9kg  Current weight: 114.9kg   Intake/Output Summary (Last 24 hours) at 03/08/2024 1333 Last data filed at 03/08/2024 1100 Gross per 24 hour  Intake 1804.17 ml  Output 1175 ml  Net 629.17 ml   Net IO Since Admission: -996.41 mL [03/08/24 1333]  UOP:  Drains/Lines: OG tube: gastric, mid stomach 61cm Endotracheal Tube Urethral Catheter  Nutritionally Relevant Medications: Scheduled Meds:  famotidine  20 mg Per Tube BID   insulin aspart  0-20 Units Subcutaneous Q4H   levETIRAcetam  1,500 mg Per Tube BID   polyethylene glycol  17 g Per Tube BID   senna  1 tablet Per Tube BID  Continuous Infusions:  sodium chloride 75 mL/hr (03/08/24 0636)   clevidipine     midazolam 4 mg/hr (03/08/24 0600)   valproate sodium     PRN Meds: polyethylene glycol  Labs Reviewed: Sodium 134 CBG ranges from 108-247 mg/dL over the last 24 hours HgbA1c 11.3   NUTRITION - FOCUSED PHYSICAL EXAM:  Flowsheet Row Most Recent Value  Orbital Region  No depletion  Upper Arm Region No depletion  Thoracic and Lumbar Region No depletion  Buccal Region No depletion  Temple Region Unable to assess  [EEG]  Clavicle Bone Region No depletion  Clavicle and Acromion Bone Region No depletion  Scapular Bone Region Unable to assess  Dorsal Hand Unable to assess  Patellar Region No depletion  Anterior Thigh Region No depletion  Posterior Calf Region No depletion  Edema (RD Assessment) Mild  [non pitting]  Hair Reviewed  Eyes Unable to assess  Mouth Unable to assess  Skin Reviewed  Nails Unable to assess   Diet Order:   Diet Order             Diet NPO time specified  Diet effective now                   EDUCATION NEEDS:   Not appropriate for education at this time  Skin:  Skin Assessment: Reviewed RN Assessment (bruising with some abrasions arms and legs)  Last BM:  Unknown  Height:  Ht Readings from Last 1 Encounters:  03/07/24 5\' 9"  (1.753 m)    Weight:  Wt Readings from Last 1 Encounters:  03/08/24 114.9 kg   Ideal Body Weight:  66 kg  BMI:  Body mass index is 37.41 kg/m.  Estimated Nutritional Needs:   Kcal:  1900-2100  Protein:  95-110g  Fluid:  >2L  Louis Meckel Dietetic Intern

## 2024-03-08 NOTE — Progress Notes (Addendum)
 Subjective: Seizure frequency improved from 7 seizures 4 seizures per hour but still lasting 1.5 to 2 minutes each.  Tmax 100.4 overnight  ROS: Unable to obtain due to poor mental status  Examination  Vital signs in last 24 hours: Temp:  [98.2 F (36.8 C)-100.4 F (38 C)] 100.4 F (38 C) (03/27 1029) Pulse Rate:  [70-108] 80 (03/27 1029) Resp:  [9-39] 18 (03/27 1029) BP: (77-178)/(52-120) 169/97 (03/27 0826) SpO2:  [93 %-100 %] 99 % (03/27 1029) FiO2 (%):  [40 %-100 %] 40 % (03/27 0826) Weight:  [114.9 kg] 114.9 kg (03/27 0500)  General: lying in bed, intubated Neuro: On Versed at 10 mL/h, does not open instructions stimuli, does not follow commands, pupils equally round and reactive to light, corneal reflex intact, cough reflex intact, withdraws to noxious stimuli with antigravity strength in right upper and lower extremity, no movement in left upper and left lower extremity   Basic Metabolic Panel: Recent Labs  Lab 03/02/24 1458 03/03/24 0402 03/04/24 0805 03/05/24 0433 03/06/24 0536 03/07/24 0520 03/07/24 1412 03/07/24 1826 03/07/24 1939 03/07/24 2302 03/08/24 0337 03/08/24 0904  NA  --  132* 132* 131* 130* 130*   < > 132* 132* 133* 134* 134*  K  --  3.6 4.2 3.6 3.7 4.2  --  4.0  --   --  3.8  --   CL  --  100 96* 95* 95* 94*  --   --   --   --  99  --   CO2  --  25 26 26 28 25   --   --   --   --  23  --   GLUCOSE  --  221* 205* 174* 205* 237*  --   --   --   --  116*  --   BUN  --  6 13 17 20  21*  --   --   --   --  21*  --   CREATININE  --  0.71 0.74 0.82 0.83 0.94  --   --   --   --  1.15*  --   CALCIUM  --  8.5* 9.3 9.2 8.8* 9.2  --   --   --   --  8.9  --   MG 1.4* 1.5*  --   --  1.9  --   --   --   --   --  2.1  --   PHOS  --   --   --   --  3.0  --   --   --   --   --   --   --    < > = values in this interval not displayed.    CBC: Recent Labs  Lab 03/02/24 0844 03/03/24 0402 03/05/24 0433 03/06/24 0536 03/07/24 1205 03/07/24 1826 03/08/24 0337   WBC 18.3* 15.7* 15.7* 17.7* 20.3*  --  20.7*  NEUTROABS 15.8*  --   --   --   --   --  10.8*  HGB 13.3 12.3 12.3 12.1 12.6 12.6 12.6  HCT 40.8 38.3 38.5 36.8 38.9 37.0 40.3  MCV 84.5 83.8 83.9 82.7 83.5  --  84.7  PLT 184 170 189 212 250  --  177     Coagulation Studies: No results for input(s): "LABPROT", "INR" in the last 72 hours.  Imaging personally reviewed  CT head without contrast 03/07/2024: Unchanged right subdural hematoma. Unchanged parenchymal hemorrhage in the right temporal lobe. Unchanged  trace leftward midline shift.  MRI brain with and without contrast 03/06/2024: MRI reveals the intracranial hemorrhage to be bilateral Subdural Hematoma, right (stable) > left (trace), and with abundant subdural blood along the dorsal clivus and ventral cisterna magna. Stable superimposed right temporal lobe intra-axial hemorrhage (volume estimated at 2 mL). Surrounding edema. No midline shift.  No IVH or ventriculomegaly.  No superimposed acute intracranial infarct or enhancing mass. 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   ASSESSMENT AND PLAN: [name] is a 58 y.o. 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, concern for focal status epilepticus on 03/07/2024  Convulsive focal status epilepticus, resolved Subdural hematoma Right temporal hemorrhage Acute encephalopathy due to seizures -Patient seizure frequency has improved but continues to have 4 seizures lasting 1 to 1.5 minutes every hour  Recommendations -Increase Versed to 10 mL/h this morning.  Since then, seizure morphology and evolution has improved but continues to have intermittent seizures.  Therefore we will increase Versed to 15 mL/h -If able to tolerate, may need to add propofol to achieve better control of seizures -Continue gabapentin General Milligrams every 8 hours, Vimpat 200 mg daily, Keppra 1500 mg twice daily  and Depakote 500 mg every 8 hours -Recommend goals of care discussion with family as patient is on 4 antiseizure medications as well as Versed still with frequent seizures -Discussed plan with ICU team   ADDENDUM - Still with seizures lasting 30 seconds, avg 10/hour. Will add propofol @5mcg /kg/min  CRITICAL CARE Performed by: Charlsie Quest   Total critical care time: 40 minutes  Critical care time was exclusive of separately billable procedures and treating other patients.  Critical care was necessary to treat or prevent imminent or life-threatening deterioration.  Critical care was time spent personally by me on the following activities: development of treatment plan with patient and/or surrogate as well as nursing, discussions with consultants, evaluation of patient's response to treatment, examination of patient, obtaining history from patient or surrogate, ordering and performing treatments and interventions, ordering and review of laboratory studies, ordering and review of radiographic studies, pulse oximetry and re-evaluation of patient's condition.     Lindie Spruce Epilepsy [facility] For questions after 5pm please refer to AMION to reach the Neurologist on call
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
## 2024-03-08 NOTE — Progress Notes (Signed)
 LTM maint complete - no skin breakdown seen. [facility] monitored, Event button test confirmed by [facility].

## 2024-03-08 NOTE — Progress Notes (Signed)
 OT Cancellation Note  Patient Details Name: [name] MRN: [id number] DOB: Oct 12, 1966   Cancelled Treatment:    Reason Eval/Treat Not Completed: Medical issues which prohibited therapy intubated and sedated 3/27. Will await medical clearance prior to OT attempts.  Lorre Munroe 03/08/2024, 7:11 AM

## 2024-03-08 NOTE — TOC Progression Note (Signed)
 Transition of Care [facility]) - Progression Note    Patient Details  Name: [name] MRN: [id number] Date of Birth: 22-Jun-1966  Transition of Care [facility]) CM/SW Contact  Harriet Masson, RN Phone Number: 03/08/2024, 1:18 PM  Clinical Narrative:    NCM unable to assess patient due to intubation at this time. Patient not medically stable for discharge.  NCM will continue to follow as patient progresses with care towards discharge.   Expected Discharge Plan: Home w Home Health Services Barriers to Discharge: Continued Medical Work up  Expected Discharge Plan and Services In-house Referral: Clinical Social Work   Post Acute Care Choice: Home Health Living arrangements for the past 2 months: Apartment                                       Social Determinants of Health (SDOH) Interventions SDOH Screenings   Food Insecurity: No Food Insecurity (03/02/2024)  Housing: High Risk (03/02/2024)  Transportation Needs: Unmet Transportation Needs (03/02/2024)  Utilities: At Risk (03/02/2024)  Alcohol Screen: Low Risk  (03/23/2023)  Depression (PHQ2-9): High Risk (06/02/2023)  Financial Resource Strain: Medium Risk (03/23/2023)  Physical Activity: Insufficiently Active (03/23/2023)  Social Connections: Moderately Isolated (03/02/2024)  Tobacco Use: Medium Risk (03/02/2024)    Readmission Risk Interventions    03/11/2023    3:14 PM  Readmission Risk Prevention Plan  Transportation Screening Complete  PCP or Specialist Appt within 5-7 Days Complete  Home Care Screening Complete  Medication Review (RN CM) Referral to Pharmacy

## 2024-03-08 NOTE — Progress Notes (Signed)
 PT Cancellation Note  Patient Details Name: [name] MRN: [id number] DOB: 1966-10-27   Cancelled Treatment:    Reason Eval/Treat Not Completed: Medical issues which prohibited therapy (Pt with intubation 3/26 and await medical clearance)   [name] [name] 03/08/2024, 7:08 AM [name], PT Acute Rehabilitation Services Office: [phone]

## 2024-03-09 DIAGNOSIS — Z7901 Long term (current) use of anticoagulants: Secondary | ICD-10-CM | POA: Diagnosis not present

## 2024-03-09 DIAGNOSIS — G40901 Epilepsy, unspecified, not intractable, with status epilepticus: Secondary | ICD-10-CM | POA: Diagnosis not present

## 2024-03-09 DIAGNOSIS — D72829 Elevated white blood cell count, unspecified: Secondary | ICD-10-CM | POA: Diagnosis not present

## 2024-03-09 DIAGNOSIS — G9340 Encephalopathy, unspecified: Secondary | ICD-10-CM

## 2024-03-09 DIAGNOSIS — Z515 Encounter for palliative care: Secondary | ICD-10-CM

## 2024-03-09 DIAGNOSIS — E1165 Type 2 diabetes mellitus with hyperglycemia: Secondary | ICD-10-CM

## 2024-03-09 DIAGNOSIS — S065XAA Traumatic subdural hemorrhage with loss of consciousness status unknown, initial encounter: Secondary | ICD-10-CM | POA: Diagnosis not present

## 2024-03-09 DIAGNOSIS — J9600 Acute respiratory failure, unspecified whether with hypoxia or hypercapnia: Secondary | ICD-10-CM

## 2024-03-09 DIAGNOSIS — R69 Illness, unspecified: Secondary | ICD-10-CM

## 2024-03-09 DIAGNOSIS — R569 Unspecified convulsions: Secondary | ICD-10-CM | POA: Diagnosis not present

## 2024-03-09 LAB — CBC
HCT: 40.2 % (ref 36.0–46.0)
Hemoglobin: 12.5 g/dL (ref 12.0–15.0)
MCH: 26.9 pg (ref 26.0–34.0)
MCHC: 31.1 g/dL (ref 30.0–36.0)
MCV: 86.5 fL (ref 80.0–100.0)
Platelets: 187 10*3/uL (ref 150–400)
RBC: 4.65 MIL/uL (ref 3.87–5.11)
RDW: 12.6 % (ref 11.5–15.5)
WBC: 15.7 10*3/uL — ABNORMAL HIGH (ref 4.0–10.5)
nRBC: 0 % (ref 0.0–0.2)

## 2024-03-09 LAB — MAGNESIUM: Magnesium: 2.1 mg/dL (ref 1.7–2.4)

## 2024-03-09 LAB — RENAL FUNCTION PANEL
Albumin: 2.9 g/dL — ABNORMAL LOW (ref 3.5–5.0)
Anion gap: 10 (ref 5–15)
BUN: 23 mg/dL — ABNORMAL HIGH (ref 6–20)
CO2: 25 mmol/L (ref 22–32)
Calcium: 8.5 mg/dL — ABNORMAL LOW (ref 8.9–10.3)
Chloride: 101 mmol/L (ref 98–111)
Creatinine, Ser: 0.94 mg/dL (ref 0.44–1.00)
GFR, Estimated: >60 mL/min (ref >60)
Glucose, Bld: 168 mg/dL — ABNORMAL HIGH (ref 70–99)
Phosphorus: 2.8 mg/dL (ref 2.5–4.6)
Potassium: 3.7 mmol/L (ref 3.5–5.1)
Sodium: 136 mmol/L (ref 135–145)

## 2024-03-09 LAB — TRIGLYCERIDES: Triglycerides: 81 mg/dL (ref <150)

## 2024-03-09 LAB — GLUCOSE, CAPILLARY
Glucose-Capillary: 154 mg/dL — ABNORMAL HIGH (ref 70–99)
Glucose-Capillary: 173 mg/dL — ABNORMAL HIGH (ref 70–99)
Glucose-Capillary: 190 mg/dL — ABNORMAL HIGH (ref 70–99)
Glucose-Capillary: 206 mg/dL — ABNORMAL HIGH (ref 70–99)
Glucose-Capillary: 221 mg/dL — ABNORMAL HIGH (ref 70–99)
Glucose-Capillary: 240 mg/dL — ABNORMAL HIGH (ref 70–99)

## 2024-03-09 LAB — PHOSPHORUS: Phosphorus: 3.3 mg/dL (ref 2.5–4.6)

## 2024-03-09 LAB — SODIUM
Sodium: 134 mmol/L — ABNORMAL LOW (ref 135–145)
Sodium: 135 mmol/L (ref 135–145)
Sodium: 137 mmol/L (ref 135–145)

## 2024-03-09 MED ORDER — INSULIN GLARGINE-YFGN 100 UNIT/ML ~~LOC~~ SOLN
10.0000 [IU] | Freq: Every day | SUBCUTANEOUS | Status: DC
  Administered 2024-03-09: 10 [IU] via SUBCUTANEOUS
  Filled 2024-03-09 (×2): qty 0.1

## 2024-03-09 MED ORDER — NOREPINEPHRINE 4 MG/250ML-% IV SOLN
2.0000 ug/min | INTRAVENOUS | Status: DC
  Administered 2024-03-10: 5 ug/min via INTRAVENOUS
  Filled 2024-03-09: qty 250

## 2024-03-09 MED ORDER — LACTULOSE 10 GM/15ML PO SOLN
15.0000 g | Freq: Two times a day (BID) | ORAL | Status: DC
  Administered 2024-03-09 – 2024-03-11 (×5): 15 g
  Filled 2024-03-09 (×6): qty 30

## 2024-03-09 MED ORDER — SODIUM CHLORIDE 0.9 % IV SOLN: 250.0000 mL | INTRAVENOUS | Status: AC

## 2024-03-09 MED ORDER — NOREPINEPHRINE 4 MG/250ML-% IV SOLN
INTRAVENOUS | Status: AC
  Administered 2024-03-09: 4 ug/min via INTRAVENOUS
  Filled 2024-03-09: qty 250

## 2024-03-09 NOTE — Progress Notes (Signed)
 PT Cancellation Note  Patient Details Name: [name] MRN: [id number] DOB: 1966/11/10   Cancelled Treatment:    Reason Eval/Treat Not Completed: Patient not medically ready (pt remains with active sz, intubated on continuous EEG and not medically appropriate. will sign off and await new order)   Davarious Tumbleson B Desmin Daleo 03/09/2024, 7:37 AM [name], PT Acute Rehabilitation Services Office: [phone]

## 2024-03-09 NOTE — Procedures (Addendum)
 Patient Name: [name]  MRN: [id number]  Epilepsy Attending: Charlsie Quest  Referring Physician/Provider: Gordy Councilman, MD  Duration: 03/08/2024 1502 to 03/09/2024 1502   Patient history: 58 y.o. 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, concern for focal status epilepticus this morning. EEG to evaluate for seizure   Level of alertness: comatose   AEDs during EEG study: LEV, LCM, VPA, versed, propofol   Technical aspects: This EEG study was done with scalp electrodes positioned according to the 10-20 International system of electrode placement. Electrical activity was reviewed with band pass filter of 1-70Hz , sensitivity of 7 uV/mm, display speed of 69mm/sec with a 60Hz  notched filter applied as appropriate. EEG data were recorded continuously and digitally stored.  Video monitoring was available and reviewed as appropriate.   Description: EEG showed continuous generalized and lateralized right hemisphere 3 to 6 Hz theta-delta slowing admixed with 12 to 15 Hz with activity   Seizures without clinical signs were noted arising from right parieto-occipital region.  EEG showed low-amplitude 13 to 15 Hz beta activity which then became high amplitude and appeared more sharply contoured and involved all of right hemisphere followed by vertex region.  Subsequently it evolved into 3 to 5 Hz theta-delta slowing. At the beginning of the study, average 10 seizures were noted per hour lasting about 10 seconds each. After around 1500 as propofol was added, seizure frequency improved to 6-7 seizures per hour lasting about 30 seconds each.     Hyperventilation and photic stimulation were not performed.      ABNORMALITY -Seizures without clinical signs, right parieto-occipital region -Continuous slow, generalized and lateralized right hemisphere   IMPRESSION: This study showed seizures without clinical signs arising from right  parieto-occipital region.  At the beginning of the study, average 10 seizures were noted per hour lasting about 10 seconds each. After around 1500 as propofol was added, seizure frequency improved to 6-7 seizures per hour lasting about 30 seconds each.     There is evidence of cortical dysfunction in right hemisphere likely secondary to underlying structural abnormality.  Lastly there is severe diffuse encephalopathy.  Dalina Samara Annabelle Harman

## 2024-03-09 NOTE — Progress Notes (Signed)
 Subjective: Continues to have seizures. Grandsons and friend at bedside.   ROS: Unable to obtain due to poor mental status  Examination  Vital signs in last 24 hours: Temp:  [98.6 F (37 C)-100.2 F (37.9 C)] 98.8 F (37.1 C) (03/28 1223) Pulse Rate:  [83-97] 95 (03/28 1223) Resp:  [18-20] 18 (03/28 1223) BP: (76-143)/(56-88) 143/83 (03/28 1223) SpO2:  [98 %-100 %] 100 % (03/28 1223) FiO2 (%):  [40 %] 40 % (03/28 1215) Weight:  [114.5 kg] 114.5 kg (03/28 0440)  General:  lying in bed, intubated Neuro: On Versed at 15 mL/hr and propofol@15mcg /kg/min, comatose, does not open eyes to noxious stimuli, does not follow commands, pupils equally round and reactive to light, corneal reflex intact, cough reflex intact, doesn't withdraw to noxious stimuli in all extremities  Basic Metabolic Panel: Recent Labs  Lab 03/02/24 1458 03/03/24 0402 03/04/24 0805 03/05/24 0433 03/06/24 0536 03/07/24 0520 03/07/24 1412 03/07/24 1826 03/07/24 1939 03/08/24 1610 03/08/24 0904 03/08/24 2007 03/08/24 2348 03/09/24 0244 03/09/24 0920 03/09/24 1211  NA  --  132*   < > 131* 130* 130*   < > 132*   < > 134*   < > 134* 134* 135 136 137  K  --  3.6   < > 3.6 3.7 4.2  --  4.0  --  3.8  --   --   --   --  3.7  --   CL  --  100   < > 95* 95* 94*  --   --   --  99  --   --   --   --  101  --   CO2  --  25   < > 26 28 25   --   --   --  23  --   --   --   --  25  --   GLUCOSE  --  221*   < > 174* 205* 237*  --   --   --  116*  --   --   --   --  168*  --   BUN  --  6   < > 17 20 21*  --   --   --  21*  --   --   --   --  23*  --   CREATININE  --  0.71   < > 0.82 0.83 0.94  --   --   --  1.15*  --   --   --   --  0.94  --   CALCIUM  --  8.5*   < > 9.2 8.8* 9.2  --   --   --  8.9  --   --   --   --  8.5*  --   MG 1.4* 1.5*  --   --  1.9  --   --   --   --  2.1  --   --   --  2.1  --   --   PHOS  --   --   --   --  3.0  --   --   --   --   --   --   --   --  3.3 2.8  --    < > = values in this interval not  displayed.    CBC: Recent Labs  Lab 03/05/24 0433 03/06/24 0536 03/07/24 1205 03/07/24 1826 03/08/24 0337 03/09/24 0919  WBC 15.7* 17.7*  20.3*  --  20.7* 15.7*  NEUTROABS  --   --   --   --  10.8*  --   HGB 12.3 12.1 12.6 12.6 12.6 12.5  HCT 38.5 36.8 38.9 37.0 40.3 40.2  MCV 83.9 82.7 83.5  --  84.7 86.5  PLT 189 212 250  --  177 187     Coagulation Studies: No results for input(s): "LABPROT", "INR" in the last 72 hours.  Imaging NO new brain imaging overnight  ASSESSMENT AND PLAN:[name] is a 58 y.o. 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, concern for focal status epilepticus on 03/07/2024   Convulsive focal status epilepticus, resolved Subclinical seizures, refractory Subdural hematoma Right temporal hemorrhage Acute encephalopathy due to seizures -Patient seizure duration has improved but continues to have 7-10 seizures lasting 30 seconds every hour   Recommendations -Increase propofol to 22mcg/kg/min and continue versed @15ml /hr - Discussed with family to continue titrating meds to achieve seizure control but  prolonged seizures will likely lead to permanent neurological deficit specifically vision loss in left and left sided weakness. -Continue gabapentin 300mg  every 8 hours, Vimpat 200 mg daily, Keppra 1500 mg twice daily and Depakote 500 mg every 8 hours -Discussed plan with ICU team    CRITICAL CARE Performed by: Charlsie Quest     Total critical care time: 45 minutes   Critical care time was exclusive of separately billable procedures and treating other patients.   Critical care was necessary to treat or prevent imminent or life-threatening deterioration.   Critical care was time spent personally by me on the following activities: development of treatment plan with patient and/or surrogate as well as nursing, discussions with consultants, evaluation of patient's response to  treatment, examination of patient, obtaining history from patient or surrogate, ordering and performing treatments and interventions, ordering and review of laboratory studies, ordering and review of radiographic studies, pulse oximetry and re-evaluation of patient's condition.      Lindie Spruce Epilepsy [facility] For questions after 5pm please refer to AMION to reach the Neurologist on call

## 2024-03-09 NOTE — Code Documentation (Signed)
 I discussed current neurologic status and prognostication. Patient;s grandsons and friend was in room and expressed continuing all medical management but no resuscitation/chest compression in setting of cardiac arrest. Notifed RN and ICU team.  [name]

## 2024-03-09 NOTE — Progress Notes (Signed)
 eLink Physician-Brief Progress Note Patient Name: [name] DOB: [birth year] MRN: [id number]   Date of Service  03/09/2024  HPI/Events of Note  Patient became hypotensive after sedation was increased in an attempt to suppress seizures.  eICU Interventions  Levophed gtt ordered.        Migdalia Dk 03/09/2024, 11:49 PM
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
## 2024-03-09 NOTE — Progress Notes (Signed)
 For any condition change Notify:  [employer]  Detective LS Bodmer at [phone] Office number: [phone]

## 2024-03-09 NOTE — Progress Notes (Signed)
 OT Cancellation Note  Patient Details Name: [name] MRN: [id number] DOB: Sep 13, 1966   Cancelled Treatment:    Reason Eval/Treat Not Completed: Medical issues which prohibited therapy Patient not medically ready (pt remains with active seizures, intubated on continuous EEG and not medically appropriate. will sign off and await new order)   Pollyann Glen E. Shelagh Rayman, OTR/L Acute Rehabilitation Services [phone]   Cherlyn Cushing 03/09/2024, 7:49 AM

## 2024-03-09 NOTE — Progress Notes (Signed)
 Patient sister Liborio Nixon at the bedside asking about what seizure medications patient is receiving with doses and times. Borderline confrontational. Questioning about efficacy and interactions and asking if medications are "cancelling each other out". Asking for a printout of medication list. Explained that she needs to go through medical records or access patients "my Chart" Patient made notes of verbal list of medications  Family reminded several times about 2 person visitation rule. They acknowledge but continue to have 4 and 5 people in room at a time.

## 2024-03-10 ENCOUNTER — Inpatient Hospital Stay (HOSPITAL_COMMUNITY)

## 2024-03-10 DIAGNOSIS — Z7901 Long term (current) use of anticoagulants: Secondary | ICD-10-CM | POA: Diagnosis not present

## 2024-03-10 DIAGNOSIS — Z7189 Other specified counseling: Secondary | ICD-10-CM

## 2024-03-10 DIAGNOSIS — G40901 Epilepsy, unspecified, not intractable, with status epilepticus: Secondary | ICD-10-CM | POA: Diagnosis not present

## 2024-03-10 DIAGNOSIS — J9600 Acute respiratory failure, unspecified whether with hypoxia or hypercapnia: Secondary | ICD-10-CM | POA: Diagnosis not present

## 2024-03-10 DIAGNOSIS — S065XAA Traumatic subdural hemorrhage with loss of consciousness status unknown, initial encounter: Secondary | ICD-10-CM | POA: Diagnosis not present

## 2024-03-10 DIAGNOSIS — R569 Unspecified convulsions: Secondary | ICD-10-CM | POA: Diagnosis not present

## 2024-03-10 DIAGNOSIS — D72829 Elevated white blood cell count, unspecified: Secondary | ICD-10-CM | POA: Diagnosis not present

## 2024-03-10 DIAGNOSIS — R69 Illness, unspecified: Secondary | ICD-10-CM | POA: Diagnosis not present

## 2024-03-10 LAB — RENAL FUNCTION PANEL
Albumin: 2.7 g/dL — ABNORMAL LOW (ref 3.5–5.0)
Anion gap: 8 (ref 5–15)
BUN: 21 mg/dL — ABNORMAL HIGH (ref 6–20)
CO2: 23 mmol/L (ref 22–32)
Calcium: 8.3 mg/dL — ABNORMAL LOW (ref 8.9–10.3)
Chloride: 104 mmol/L (ref 98–111)
Creatinine, Ser: 1.23 mg/dL — ABNORMAL HIGH (ref 0.44–1.00)
GFR, Estimated: 51 mL/min — ABNORMAL LOW (ref >60)
Glucose, Bld: 323 mg/dL — ABNORMAL HIGH (ref 70–99)
Phosphorus: 3.1 mg/dL (ref 2.5–4.6)
Potassium: 3.9 mmol/L (ref 3.5–5.1)
Sodium: 135 mmol/L (ref 135–145)

## 2024-03-10 LAB — CBC WITH DIFFERENTIAL/PLATELET
Abs Immature Granulocytes: 0 10*3/uL (ref 0.00–0.07)
Basophils Absolute: 0 10*3/uL (ref 0.0–0.1)
Basophils Relative: 0 %
Eosinophils Absolute: 0 10*3/uL (ref 0.0–0.5)
Eosinophils Relative: 0 %
HCT: 37.2 % (ref 36.0–46.0)
Hemoglobin: 11.5 g/dL — ABNORMAL LOW (ref 12.0–15.0)
Lymphocytes Relative: 17 %
Lymphs Abs: 4.4 10*3/uL — ABNORMAL HIGH (ref 0.7–4.0)
MCH: 26.9 pg (ref 26.0–34.0)
MCHC: 30.9 g/dL (ref 30.0–36.0)
MCV: 86.9 fL (ref 80.0–100.0)
Monocytes Absolute: 1 10*3/uL (ref 0.1–1.0)
Monocytes Relative: 4 %
Neutro Abs: 20.6 10*3/uL — ABNORMAL HIGH (ref 1.7–7.7)
Neutrophils Relative %: 79 %
Platelets: 222 10*3/uL (ref 150–400)
RBC: 4.28 MIL/uL (ref 3.87–5.11)
RDW: 12.8 % (ref 11.5–15.5)
WBC: 26.1 10*3/uL — ABNORMAL HIGH (ref 4.0–10.5)
nRBC: 0 % (ref 0.0–0.2)
nRBC: 0 /100{WBCs}

## 2024-03-10 LAB — GLUCOSE, CAPILLARY
Glucose-Capillary: 173 mg/dL — ABNORMAL HIGH (ref 70–99)
Glucose-Capillary: 199 mg/dL — ABNORMAL HIGH (ref 70–99)
Glucose-Capillary: 203 mg/dL — ABNORMAL HIGH (ref 70–99)
Glucose-Capillary: 204 mg/dL — ABNORMAL HIGH (ref 70–99)
Glucose-Capillary: 250 mg/dL — ABNORMAL HIGH (ref 70–99)
Glucose-Capillary: 301 mg/dL — ABNORMAL HIGH (ref 70–99)

## 2024-03-10 LAB — VALPROIC ACID LEVEL: Valproic Acid Lvl: 74 ug/mL (ref 50.0–100.0)

## 2024-03-10 LAB — MAGNESIUM: Magnesium: 2.1 mg/dL (ref 1.7–2.4)

## 2024-03-10 MED ORDER — INSULIN GLARGINE-YFGN 100 UNIT/ML ~~LOC~~ SOLN
20.0000 [IU] | Freq: Every day | SUBCUTANEOUS | Status: DC
  Administered 2024-03-10 – 2024-03-11 (×2): 20 [IU] via SUBCUTANEOUS
  Filled 2024-03-10 (×3): qty 0.2

## 2024-03-10 MED ORDER — ATORVASTATIN CALCIUM 40 MG PO TABS
40.0000 mg | ORAL_TABLET | Freq: Every day | ORAL | Status: DC
  Administered 2024-03-10 – 2024-03-23 (×14): 40 mg
  Filled 2024-03-10 (×14): qty 1

## 2024-03-10 MED ORDER — RIVAROXABAN 20 MG PO TABS
20.0000 mg | ORAL_TABLET | Freq: Every day | ORAL | Status: DC
  Administered 2024-03-10: 20 mg
  Filled 2024-03-10: qty 1

## 2024-03-10 MED ORDER — PANTOPRAZOLE SODIUM 40 MG IV SOLR
40.0000 mg | INTRAVENOUS | Status: DC
  Administered 2024-03-10 – 2024-03-24 (×15): 40 mg via INTRAVENOUS
  Filled 2024-03-10 (×15): qty 10

## 2024-03-10 MED ORDER — RIVAROXABAN 20 MG PO TABS
20.0000 mg | ORAL_TABLET | Freq: Every day | ORAL | Status: DC
  Administered 2024-03-11 – 2024-03-22 (×12): 20 mg
  Filled 2024-03-10 (×13): qty 1
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
## 2024-03-10 NOTE — Progress Notes (Signed)
 LTM maint complete - no skin breakdown under:  Fp2, A1

## 2024-03-10 NOTE — Procedures (Signed)
 Patient Name: [name]  MRN: [id number]  Epilepsy Attending: Charlsie Quest  Referring Physician/Provider: Gordy Councilman, MD  Duration: 03/09/2024 1502 to 03/10/2024 1502   Patient history: 58 y.o. 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, concern for focal status epilepticus this morning. EEG to evaluate for seizure   Level of alertness: comatose   AEDs during EEG study: LEV, LCM, VPA, versed, propofol   Technical aspects: This EEG study was done with scalp electrodes positioned according to the 10-20 International system of electrode placement. Electrical activity was reviewed with band pass filter of 1-70Hz , sensitivity of 7 uV/mm, display speed of 1mm/sec with a 60Hz  notched filter applied as appropriate. EEG data were recorded continuously and digitally stored.  Video monitoring was available and reviewed as appropriate.   Description: EEG showed continuous generalized and lateralized right hemisphere 3 to 6 Hz theta-delta slowing admixed with 12 to 15 Hz with activity   Seizures without clinical signs were noted arising from right parieto-occipital region.  EEG showed low-amplitude 13 to 15 Hz beta activity which then became high amplitude and appeared more sharply contoured and involved all of right hemisphere followed by vertex region.  Subsequently it evolved into 3 to 5 Hz theta-delta slowing. At the beginning of the study, average 6-7 seizures per hour lasting about 30 seconds each.  Gradually as versed and propofol were titrated, duration of seizures improved to 10-15 seconds. Propofol and versed were further adjusted and seizures eventually stopped with last seizure noted on 03/09/2024 at 2155.  Subsequently EEG showed burst suppression pattern with bursts of 3-7hz  theta-delta slowing lasting 2-4 seconds alternating with 5-18 seconds of generalized suppression.  Hyperventilation and photic stimulation were  not performed.      ABNORMALITY -Seizures without clinical signs, right parieto-occipital region -Burst suppression, generalized   IMPRESSION: This study initially showed seizures without clinical signs arising from right parieto-occipital region.  At the beginning of the study, average  6-7 seizures per hour lasting about 30 seconds each. Gradually as propofol and versed were adjusted, seizures stopped. Last seizure noted on 03/09/2024 at 2155.   Subsequently EEG was suggestive of profound diffuse encephalopathy likely due to sedation.    Khadar Monger Annabelle Harman

## 2024-03-10 NOTE — Progress Notes (Signed)
 Subjective: Intubated, sedated On pressors due to sedation   ROS: Unable to obtain due to poor mental status  Examination  Vital signs in last 24 hours: Temp:  [98.4 F (36.9 C)-99.3 F (37.4 C)] 99.3 F (37.4 C) (03/29 0815) Pulse Rate:  [86-107] 95 (03/29 0757) Resp:  [18-25] 25 (03/29 0815) BP: (68-143)/(26-91) 92/66 (03/29 0815) SpO2:  [92 %-100 %] 97 % (03/29 0800) FiO2 (%):  [40 %] 40 % (03/29 0800) Weight:  [114.9 kg] 114.9 kg (03/29 0500)  General:  lying in bed, intubated  Neuro: On Versed at 20 mL/hr and propofol@40mcg /kg/min, comatose, does not open eyes to noxious stimuli, does not follow commands, pupils equally round and reactive to light, corneal reflex absent, cough reflex absent, doesn't withdraw to noxious stimuli in all extremities  Basic Metabolic Panel: Recent Labs  Lab 03/06/24 0536 03/07/24 0520 03/07/24 1412 03/07/24 1826 03/07/24 1939 03/08/24 0337 03/08/24 0904 03/08/24 2348 03/09/24 0244 03/09/24 0920 03/09/24 1211 03/10/24 0756  NA 130* 130*   < > 132*   < > 134*   < > 134* 135 136 137 135  K 3.7 4.2  --  4.0  --  3.8  --   --   --  3.7  --  3.9  CL 95* 94*  --   --   --  99  --   --   --  101  --  104  CO2 28 25  --   --   --  23  --   --   --  25  --  23  GLUCOSE 205* 237*  --   --   --  116*  --   --   --  168*  --  323*  BUN 20 21*  --   --   --  21*  --   --   --  23*  --  21*  CREATININE 0.83 0.94  --   --   --  1.15*  --   --   --  0.94  --  1.23*  CALCIUM 8.8* 9.2  --   --   --  8.9  --   --   --  8.5*  --  8.3*  MG 1.9  --   --   --   --  2.1  --   --  2.1  --   --  2.1  PHOS 3.0  --   --   --   --   --   --   --  3.3 2.8  --  3.1   < > = values in this interval not displayed.    CBC: Recent Labs  Lab 03/06/24 0536 03/07/24 1205 03/07/24 1826 03/08/24 0337 03/09/24 0919 03/10/24 0756  WBC 17.7* 20.3*  --  20.7* 15.7* 26.1*  NEUTROABS  --   --   --  10.8*  --  20.6*  HGB 12.1 12.6 12.6 12.6 12.5 11.5*  HCT 36.8 38.9  37.0 40.3 40.2 37.2  MCV 82.7 83.5  --  84.7 86.5 86.9  PLT 212 250  --  177 187 222     Coagulation Studies: No results for input(s): "LABPROT", "INR" in the last 72 hours.  Depakote level  79 03/08/24 09:04 (post 2000 mg load 3/26 2300)   EEG: This study initially showed seizures without clinical signs arising from right parieto-occipital region.  At the beginning of the study, average  6-7 seizures per hour lasting about  30 seconds each. Gradually as propofol and versed were adjusted, seizures stopped. Last seizure noted on 03/09/2024 at 2155. Subsequently EEG was suggestive of profound diffuse encephalopathy likely due to sedation.   Imaging NO new brain imaging overnight  ASSESSMENT AND PLAN:[name] is a 58 y.o. 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, concern for focal status epilepticus on 03/07/2024  3/28 Dr. Melynda Ripple discussed with family to continue titrating meds to achieve seizure control but prolonged seizures will likely lead to permanent neurological deficit specifically vision loss in left and left sided weakness.   Convulsive focal status epilepticus, resolved Subclinical seizures, resolved as of 2155 on 03/09/2024 Subdural hematoma Right temporal hemorrhage Acute encephalopathy due to seizures    Recommendations -Continue propofol at 40 mcg/kg/min and continue versed @ 20 ml/hr today -Continue  Gabapentin 300mg  every 8 hours,  Vimpat 200 mg daily,  Keppra 1500 mg twice daily and  Depakote 500 mg every 8 hours  Check level at 1300 prior to next dose today -Consider Fycompa loading dose if seizures recur at the next line agent -Discussed plan with ICU team via secure chat   CRITICAL CARE Performed by:  Brooke Dare MD-PhD [facility] [id number]    Total critical care time: 30 minutes   Critical care time was exclusive of separately billable procedures and treating  other patients.   Critical care was necessary to treat or prevent imminent or life-threatening deterioration.   Critical care was time spent personally by me on the following activities: development of treatment plan with patient and/or surrogate as well as nursing, discussions with consultants, evaluation of patient's response to treatment, examination of patient, obtaining history from patient or surrogate, ordering and performing treatments and interventions, ordering and review of laboratory studies, ordering and review of radiographic studies, pulse oximetry and re-evaluation of patient's condition.

## 2024-03-11 ENCOUNTER — Inpatient Hospital Stay (HOSPITAL_COMMUNITY)

## 2024-03-11 DIAGNOSIS — J9600 Acute respiratory failure, unspecified whether with hypoxia or hypercapnia: Secondary | ICD-10-CM | POA: Diagnosis not present

## 2024-03-11 DIAGNOSIS — Z7901 Long term (current) use of anticoagulants: Secondary | ICD-10-CM | POA: Diagnosis not present

## 2024-03-11 DIAGNOSIS — R69 Illness, unspecified: Secondary | ICD-10-CM | POA: Diagnosis not present

## 2024-03-11 DIAGNOSIS — R569 Unspecified convulsions: Secondary | ICD-10-CM | POA: Diagnosis not present

## 2024-03-11 DIAGNOSIS — D72829 Elevated white blood cell count, unspecified: Secondary | ICD-10-CM | POA: Diagnosis not present

## 2024-03-11 DIAGNOSIS — S065XAA Traumatic subdural hemorrhage with loss of consciousness status unknown, initial encounter: Secondary | ICD-10-CM | POA: Diagnosis not present

## 2024-03-11 DIAGNOSIS — G40901 Epilepsy, unspecified, not intractable, with status epilepticus: Secondary | ICD-10-CM | POA: Diagnosis not present

## 2024-03-11 LAB — GLUCOSE, CAPILLARY
Glucose-Capillary: 189 mg/dL — ABNORMAL HIGH (ref 70–99)
Glucose-Capillary: 233 mg/dL — ABNORMAL HIGH (ref 70–99)
Glucose-Capillary: 255 mg/dL — ABNORMAL HIGH (ref 70–99)
Glucose-Capillary: 259 mg/dL — ABNORMAL HIGH (ref 70–99)
Glucose-Capillary: 259 mg/dL — ABNORMAL HIGH (ref 70–99)
Glucose-Capillary: 265 mg/dL — ABNORMAL HIGH (ref 70–99)

## 2024-03-11 LAB — RENAL FUNCTION PANEL
Albumin: 2.4 g/dL — ABNORMAL LOW (ref 3.5–5.0)
Anion gap: 9 (ref 5–15)
BUN: 24 mg/dL — ABNORMAL HIGH (ref 6–20)
CO2: 22 mmol/L (ref 22–32)
Calcium: 8.1 mg/dL — ABNORMAL LOW (ref 8.9–10.3)
Chloride: 104 mmol/L (ref 98–111)
Creatinine, Ser: 1.07 mg/dL — ABNORMAL HIGH (ref 0.44–1.00)
GFR, Estimated: >60 mL/min (ref >60)
Glucose, Bld: 211 mg/dL — ABNORMAL HIGH (ref 70–99)
Phosphorus: 3.1 mg/dL (ref 2.5–4.6)
Potassium: 4.1 mmol/L (ref 3.5–5.1)
Sodium: 135 mmol/L (ref 135–145)

## 2024-03-11 LAB — CBC WITH DIFFERENTIAL/PLATELET
Abs Immature Granulocytes: 0 10*3/uL (ref 0.00–0.07)
Basophils Absolute: 0.3 10*3/uL — ABNORMAL HIGH (ref 0.0–0.1)
Basophils Relative: 1 %
Eosinophils Absolute: 0.9 10*3/uL — ABNORMAL HIGH (ref 0.0–0.5)
Eosinophils Relative: 3 %
HCT: 33.9 % — ABNORMAL LOW (ref 36.0–46.0)
Hemoglobin: 10.4 g/dL — ABNORMAL LOW (ref 12.0–15.0)
Lymphocytes Relative: 9 %
Lymphs Abs: 2.6 10*3/uL (ref 0.7–4.0)
MCH: 27 pg (ref 26.0–34.0)
MCHC: 30.7 g/dL (ref 30.0–36.0)
MCV: 88.1 fL (ref 80.0–100.0)
Monocytes Absolute: 2.6 10*3/uL — ABNORMAL HIGH (ref 0.1–1.0)
Monocytes Relative: 9 %
Neutro Abs: 22.2 10*3/uL — ABNORMAL HIGH (ref 1.7–7.7)
Neutrophils Relative %: 78 %
Platelets: 174 10*3/uL (ref 150–400)
RBC: 3.85 MIL/uL — ABNORMAL LOW (ref 3.87–5.11)
RDW: 12.9 % (ref 11.5–15.5)
Smear Review: NORMAL
WBC: 28.5 10*3/uL — ABNORMAL HIGH (ref 4.0–10.5)
nRBC: 0 % (ref 0.0–0.2)

## 2024-03-11 LAB — URINALYSIS, ROUTINE W REFLEX MICROSCOPIC
Bilirubin Urine: NEGATIVE
Glucose, UA: >=500 mg/dL — AB
Hgb urine dipstick: NEGATIVE
Ketones, ur: NEGATIVE mg/dL
Nitrite: POSITIVE — AB
Protein, ur: NEGATIVE mg/dL
Specific Gravity, Urine: 1.015 (ref 1.005–1.030)
WBC, UA: >50 WBC/hpf (ref 0–5)
pH: 5 (ref 5.0–8.0)

## 2024-03-11 LAB — MAGNESIUM: Magnesium: 2.2 mg/dL (ref 1.7–2.4)

## 2024-03-11 MED ORDER — SODIUM CHLORIDE 0.9 % IV SOLN
2.0000 g | INTRAVENOUS | Status: DC
  Administered 2024-03-11 – 2024-03-12 (×2): 2 g via INTRAVENOUS
  Filled 2024-03-11 (×2): qty 20

## 2024-03-11 MED ORDER — INSULIN ASPART 100 UNIT/ML IJ SOLN
3.0000 [IU] | INTRAMUSCULAR | Status: DC
  Administered 2024-03-11 – 2024-03-12 (×5): 3 [IU] via SUBCUTANEOUS

## 2024-03-11 MED ORDER — MUPIROCIN 2 % EX OINT
TOPICAL_OINTMENT | Freq: Two times a day (BID) | CUTANEOUS | Status: DC
  Administered 2024-03-11 – 2024-03-27 (×11): 1 via NASAL
  Filled 2024-03-11 (×4): qty 22
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
## 2024-03-11 NOTE — Plan of Care (Signed)
  Problem: Fluid Volume: Goal: Ability to maintain a balanced intake and output will improve Outcome: Progressing   Problem: Metabolic: Goal: Ability to maintain appropriate glucose levels will improve Outcome: Progressing   Problem: Nutritional: Goal: Maintenance of adequate nutrition will improve Outcome: Progressing Goal: Progress toward achieving an optimal weight will improve Outcome: Progressing   Problem: Skin Integrity: Goal: Risk for impaired skin integrity will decrease Outcome: Progressing   Problem: Tissue Perfusion: Goal: Adequacy of tissue perfusion will improve Outcome: Progressing   Problem: Education: Goal: Knowledge of General Education information will improve Description: Including pain rating scale, medication(s)/side effects and non-pharmacologic comfort measures Outcome: Progressing   Problem: Health Behavior/Discharge Planning: Goal: Ability to manage health-related needs will improve Outcome: Progressing   Problem: Clinical Measurements: Goal: Ability to maintain clinical measurements within normal limits will improve Outcome: Progressing Goal: Will remain free from infection Outcome: Progressing Goal: Diagnostic test results will improve Outcome: Progressing Goal: Respiratory complications will improve Outcome: Progressing Goal: Cardiovascular complication will be avoided Outcome: Progressing   Problem: Activity: Goal: Risk for activity intolerance will decrease Outcome: Progressing   Problem: Nutrition: Goal: Adequate nutrition will be maintained Outcome: Progressing   Problem: Coping: Goal: Level of anxiety will decrease Outcome: Progressing   Problem: Elimination: Goal: Will not experience complications related to bowel motility Outcome: Progressing Goal: Will not experience complications related to urinary retention Outcome: Progressing   Problem: Pain Managment: Goal: General experience of comfort will improve and/or be  controlled Outcome: Progressing   Problem: Safety: Goal: Ability to remain free from injury will improve Outcome: Progressing   Problem: Skin Integrity: Goal: Risk for impaired skin integrity will decrease Outcome: Progressing   Problem: Education: Goal: Expressions of having a comfortable level of knowledge regarding the disease process will increase Outcome: Progressing   Problem: Health Behavior/Discharge Planning: Goal: Compliance with prescribed medication regimen will improve Outcome: Progressing   Problem: Medication: Goal: Risk for medication side effects will decrease Outcome: Progressing   Problem: Clinical Measurements: Goal: Complications related to the disease process, condition or treatment will be avoided or minimized Outcome: Progressing Goal: Diagnostic test results will improve Outcome: Progressing   Problem: Safety: Goal: Verbalization of understanding the information provided will improve Outcome: Progressing   Problem: Coping: Goal: Ability to adjust to condition or change in health will improve Outcome: Not Progressing   Problem: Health Behavior/Discharge Planning: Goal: Ability to manage health-related needs will improve Outcome: Not Progressing   Problem: Coping: Goal: Ability to adjust to condition or change in health will improve Outcome: Not Progressing   Problem: Education: Goal: Ability to describe self-care measures that may prevent or decrease complications (Diabetes Survival Skills Education) will improve Outcome: Not Applicable Goal: Individualized Educational Video(s) Outcome: Not Applicable   Problem: Health Behavior/Discharge Planning: Goal: Ability to identify and utilize available resources and services will improve Outcome: Not Applicable   Problem: Coping: Goal: Ability to identify appropriate support needs will improve Outcome: Not Applicable

## 2024-03-11 NOTE — Procedures (Signed)
 Patient Name: [name]  MRN: [id number]  Epilepsy Attending: Charlsie Quest  Referring Physician/Provider: Gordy Councilman, MD  Duration: 03/10/2024 1502 to 03/11/2024 1502   Patient history: 58 y.o. 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, concern for focal status epilepticus this morning. EEG to evaluate for seizure   Level of alertness: comatose   AEDs during EEG study: LEV, LCM, VPA, versed, propofol   Technical aspects: This EEG study was done with scalp electrodes positioned according to the 10-20 International system of electrode placement. Electrical activity was reviewed with band pass filter of 1-70Hz , sensitivity of 7 uV/mm, display speed of 73mm/sec with a 60Hz  notched filter applied as appropriate. EEG data were recorded continuously and digitally stored.  Video monitoring was available and reviewed as appropriate.   Description: EEG showed burst suppression pattern with bursts of 3-7hz  theta-delta slowing lasting 4-8 seconds alternating with 2-5 seconds of generalized suppression. Hyperventilation and photic stimulation were not performed.      ABNORMALITY -Burst suppression, generalized   IMPRESSION: This study was suggestive of profound diffuse encephalopathy likely due to sedation. No seizure were noted.   Naysa Puskas Annabelle Harman
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
## 2024-03-11 NOTE — Progress Notes (Signed)
 LTM maint complete - no skin breakdown under:  Cz, REF

## 2024-03-11 NOTE — Progress Notes (Signed)
 Subjective: Intubated, sedated On pressors due to sedation   ROS: Unable to obtain due to poor mental status  Examination  Vital signs in last 24 hours: Temp:  [97 F (36.1 C)-98.2 F (36.8 C)] 97.2 F (36.2 C) (03/30 1136) Pulse Rate:  [71-96] 76 (03/30 1136) Resp:  [7-26] 18 (03/30 1136) BP: (79-147)/(50-88) 96/54 (03/30 1130) SpO2:  [91 %-100 %] 96 % (03/30 1136) FiO2 (%):  [40 %] 40 % (03/30 1136) Weight:  [115.8 kg] 115.8 kg (03/30 0500)  General:  lying in bed, intubated  Neuro: On Versed at 20 mL/hr and propofol@40mcg /kg/min, comatose, does not open eyes to noxious stimuli, does not follow commands, pupils equally round and reactive to light, corneal reflex absent, cough reflex absent, doesn't withdraw to noxious stimuli in all extremities  Basic Metabolic Panel: Recent Labs  Lab 03/06/24 0536 03/07/24 0520 03/07/24 1412 03/07/24 1826 03/07/24 1939 03/08/24 0337 03/08/24 0904 03/09/24 0244 03/09/24 0920 03/09/24 1211 03/10/24 0756 03/11/24 0303  NA 130* 130*   < > 132*   < > 134*   < > 135 136 137 135 135  K 3.7 4.2  --  4.0  --  3.8  --   --  3.7  --  3.9 4.1  CL 95* 94*  --   --   --  99  --   --  101  --  104 104  CO2 28 25  --   --   --  23  --   --  25  --  23 22  GLUCOSE 205* 237*  --   --   --  116*  --   --  168*  --  323* 211*  BUN 20 21*  --   --   --  21*  --   --  23*  --  21* 24*  CREATININE 0.83 0.94  --   --   --  1.15*  --   --  0.94  --  1.23* 1.07*  CALCIUM 8.8* 9.2  --   --   --  8.9  --   --  8.5*  --  8.3* 8.1*  MG 1.9  --   --   --   --  2.1  --  2.1  --   --  2.1 2.2  PHOS 3.0  --   --   --   --   --   --  3.3 2.8  --  3.1 3.1   < > = values in this interval not displayed.    CBC: Recent Labs  Lab 03/07/24 1205 03/07/24 1826 03/08/24 0337 03/09/24 0919 03/10/24 0756 03/11/24 0303  WBC 20.3*  --  20.7* 15.7* 26.1* 28.5*  NEUTROABS  --   --  10.8*  --  20.6* 22.2*  HGB 12.6 12.6 12.6 12.5 11.5* 10.4*  HCT 38.9 37.0 40.3 40.2  37.2 33.9*  MCV 83.5  --  84.7 86.5 86.9 88.1  PLT 250  --  177 187 222 174     Coagulation Studies: No results for input(s): "LABPROT", "INR" in the last 72 hours.  Depakote level 74 at 12:48 PM (dose due 2 PM) 79 03/08/24 09:04 (post 2000 mg load 3/26 2300)   EEG: 3/28 - 3/29 This study initially showed seizures without clinical signs arising from right parieto-occipital region.  At the beginning of the study, average  6-7 seizures per hour lasting about 30 seconds each. Gradually as propofol and versed were adjusted,  seizures stopped. Last seizure noted on 03/09/2024 at 2155. Subsequently EEG was suggestive of profound diffuse encephalopathy likely due to sedation.   EEG: 03/10/2024 1502 to 03/11/2024 0730  ABNORMALITY -Burst suppression, generalized IMPRESSION: This study was suggestive of profound diffuse encephalopathy likely due to sedation. No seizure were noted.  Imaging NO new brain imaging overnight  ASSESSMENT AND PLAN:  GENOA FREYRE is a 58 y.o. 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, concern for focal status epilepticus on 03/07/2024  3/28 Dr. Melynda Ripple discussed with family to continue titrating meds to achieve seizure control but prolonged seizures will likely lead to permanent neurological deficit specifically vision loss in left and left sided weakness.   Convulsive focal status epilepticus, resolved Subclinical seizures, resolved as of 2155 on 03/09/2024 Subdural hematoma Right temporal hemorrhage Acute encephalopathy due to seizures/medications  Recommendations -Wean propofol from 40 mcg/kg/min by 10 mcg/kg/min every hour today -Continue versed @ 20 ml/hr today -Continue  Gabapentin 300mg  every 8 hours,  Vimpat 200 mg daily,  Keppra 1500 mg twice daily and  Depakote 500 mg every 8 hours; last level therapeutic yesterday -Consider Fycompa loading dose if seizures recur at the next line  agent -Discussed plan with ICU team via secure chat; family updated at bedside  CRITICAL CARE Performed by:  Brooke Dare MD-PhD [facility] [phone]    Total critical care time: 35 minutes   Critical care time was exclusive of separately billable procedures and treating other patients.   Critical care was necessary to treat or prevent imminent or life-threatening deterioration.   Critical care was time spent personally by me on the following activities: development of treatment plan with patient and/or surrogate as well as nursing, discussions with consultants, evaluation of patient's response to treatment, examination of patient, obtaining history from patient or surrogate, ordering and performing treatments and interventions, ordering and review of laboratory studies, ordering and review of radiographic studies, pulse oximetry and re-evaluation of patient's condition.

## 2024-03-12 ENCOUNTER — Inpatient Hospital Stay (HOSPITAL_COMMUNITY)

## 2024-03-12 DIAGNOSIS — Z7189 Other specified counseling: Secondary | ICD-10-CM | POA: Diagnosis not present

## 2024-03-12 DIAGNOSIS — S0634AA Traumatic hemorrhage of right cerebrum with loss of consciousness status unknown, initial encounter: Secondary | ICD-10-CM

## 2024-03-12 DIAGNOSIS — G9340 Encephalopathy, unspecified: Secondary | ICD-10-CM | POA: Diagnosis not present

## 2024-03-12 DIAGNOSIS — G40901 Epilepsy, unspecified, not intractable, with status epilepticus: Secondary | ICD-10-CM | POA: Diagnosis not present

## 2024-03-12 DIAGNOSIS — S065XAA Traumatic subdural hemorrhage with loss of consciousness status unknown, initial encounter: Secondary | ICD-10-CM | POA: Diagnosis not present

## 2024-03-12 DIAGNOSIS — R569 Unspecified convulsions: Secondary | ICD-10-CM | POA: Diagnosis not present

## 2024-03-12 DIAGNOSIS — R69 Illness, unspecified: Secondary | ICD-10-CM | POA: Diagnosis not present

## 2024-03-12 LAB — CBC WITH DIFFERENTIAL/PLATELET
Abs Immature Granulocytes: 0.22 10*3/uL — ABNORMAL HIGH (ref 0.00–0.07)
Basophils Absolute: 0 10*3/uL (ref 0.0–0.1)
Basophils Relative: 0 %
Eosinophils Absolute: 0.2 10*3/uL (ref 0.0–0.5)
Eosinophils Relative: 1 %
HCT: 34.6 % — ABNORMAL LOW (ref 36.0–46.0)
Hemoglobin: 10.9 g/dL — ABNORMAL LOW (ref 12.0–15.0)
Immature Granulocytes: 1 %
Lymphocytes Relative: 9 %
Lymphs Abs: 1.6 10*3/uL (ref 0.7–4.0)
MCH: 27.7 pg (ref 26.0–34.0)
MCHC: 31.5 g/dL (ref 30.0–36.0)
MCV: 88 fL (ref 80.0–100.0)
Monocytes Absolute: 1.8 10*3/uL — ABNORMAL HIGH (ref 0.1–1.0)
Monocytes Relative: 10 %
Neutro Abs: 15.5 10*3/uL — ABNORMAL HIGH (ref 1.7–7.7)
Neutrophils Relative %: 79 %
Platelets: 194 10*3/uL (ref 150–400)
RBC: 3.93 MIL/uL (ref 3.87–5.11)
RDW: 12.9 % (ref 11.5–15.5)
WBC: 19.4 10*3/uL — ABNORMAL HIGH (ref 4.0–10.5)
nRBC: 0 % (ref 0.0–0.2)

## 2024-03-12 LAB — GLUCOSE, CAPILLARY
Glucose-Capillary: 305 mg/dL — ABNORMAL HIGH (ref 70–99)
Glucose-Capillary: 307 mg/dL — ABNORMAL HIGH (ref 70–99)
Glucose-Capillary: 332 mg/dL — ABNORMAL HIGH (ref 70–99)
Glucose-Capillary: 272 mg/dL — ABNORMAL HIGH (ref 70–99)
Glucose-Capillary: 254 mg/dL — ABNORMAL HIGH (ref 70–99)
Glucose-Capillary: 217 mg/dL — ABNORMAL HIGH (ref 70–99)
Glucose-Capillary: 213 mg/dL — ABNORMAL HIGH (ref 70–99)

## 2024-03-12 LAB — RENAL FUNCTION PANEL
Albumin: 2.4 g/dL — ABNORMAL LOW (ref 3.5–5.0)
Anion gap: 5 (ref 5–15)
BUN: 17 mg/dL (ref 6–20)
CO2: 28 mmol/L (ref 22–32)
Calcium: 8.4 mg/dL — ABNORMAL LOW (ref 8.9–10.3)
Chloride: 105 mmol/L (ref 98–111)
Creatinine, Ser: 1.03 mg/dL — ABNORMAL HIGH (ref 0.44–1.00)
GFR, Estimated: >60 mL/min (ref >60)
Glucose, Bld: 293 mg/dL — ABNORMAL HIGH (ref 70–99)
Phosphorus: 1.7 mg/dL — ABNORMAL LOW (ref 2.5–4.6)
Potassium: 4 mmol/L (ref 3.5–5.1)
Sodium: 138 mmol/L (ref 135–145)

## 2024-03-12 LAB — TRIGLYCERIDES: Triglycerides: 62 mg/dL (ref <150)

## 2024-03-12 MED ORDER — SENNA 8.6 MG PO TABS: 1.0000 | ORAL_TABLET | Freq: Two times a day (BID) | ORAL | Status: DC | PRN

## 2024-03-12 MED ORDER — INSULIN ASPART 100 UNIT/ML IJ SOLN
8.0000 [IU] | INTRAMUSCULAR | Status: DC
  Administered 2024-03-12 – 2024-03-13 (×7): 8 [IU] via SUBCUTANEOUS

## 2024-03-12 MED ORDER — GLUCERNA 1.5 CAL PO LIQD
1000.0000 mL | ORAL | Status: DC
  Administered 2024-03-12 – 2024-04-01 (×21): 1000 mL
  Filled 2024-03-12 (×32): qty 1000

## 2024-03-12 MED ORDER — LEVETIRACETAM 500 MG PO TABS
1500.0000 mg | ORAL_TABLET | Freq: Two times a day (BID) | ORAL | Status: DC
  Administered 2024-03-12 – 2024-03-18 (×14): 1500 mg
  Filled 2024-03-12 (×14): qty 3

## 2024-03-12 MED ORDER — SODIUM CHLORIDE 0.9 % IV SOLN
30.0000 mmol | Freq: Once | INTRAVENOUS | Status: AC
  Administered 2024-03-12: 30 mmol via INTRAVENOUS
  Filled 2024-03-12: qty 10

## 2024-03-12 MED ORDER — POLYETHYLENE GLYCOL 3350 17 G PO PACK
17.0000 g | PACK | Freq: Every day | ORAL | Status: DC
  Filled 2024-03-12: qty 1

## 2024-03-12 MED ORDER — INSULIN GLARGINE-YFGN 100 UNIT/ML ~~LOC~~ SOLN
25.0000 [IU] | Freq: Every day | SUBCUTANEOUS | Status: DC
  Administered 2024-03-12: 25 [IU] via SUBCUTANEOUS
  Filled 2024-03-12 (×2): qty 0.25
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
## 2024-03-12 NOTE — Procedures (Signed)
 Patient Name: [name]  MRN: [id number]  Epilepsy Attending: Charlsie Quest  Referring Physician/Provider: Gordy Councilman, MD  Duration: 03/11/2024 1502 to 03/12/2024 1502   Patient history: 57 y.o. 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, concern for focal status epilepticus this morning. EEG to evaluate for seizure   Level of alertness: comatose   AEDs during EEG study: LEV, LCM, VPA, versed   Technical aspects: This EEG study was done with scalp electrodes positioned according to the 10-20 International system of electrode placement. Electrical activity was reviewed with band pass filter of 1-70Hz , sensitivity of 7 uV/mm, display speed of 7mm/sec with a 60Hz  notched filter applied as appropriate. EEG data were recorded continuously and digitally stored.  Video monitoring was available and reviewed as appropriate.   Description: At the beginning of the study, EEG showed burst suppression pattern with bursts of 3-7hz  theta-delta slowing lasting 4-8 seconds alternating with 2-5 seconds of generalized suppression. Gradually as sedation was weaned, EEG became near continuous showing polymorphic mixed frequencies with 3 to 7 Hz theta-delta slowing admixed with 12 to 14 Hz beta activity.  Brief 1 to 2 seconds of generalized EEG attenuation was also noted at times.  Hyperventilation and photic stimulation were not performed.      ABNORMALITY -Burst suppression, generalized -Continuous slow, generalized   IMPRESSION: This study was initially suggestive of profound diffuse encephalopathy likely due to sedation. Gradually as sedation was weaned, EEG was suggestive of severe diffuse encephalopathy.  No seizure were noted.   Eula Mazzola Annabelle Harman

## 2024-03-12 NOTE — Progress Notes (Addendum)
 Subjective: Tmax 100.8 Fahrenheit.  No family at bedside this morning.  ROS: Unable to obtain due to poor mental status  Examination  Vital signs in last 24 hours: Temp:  [97 F (36.1 C)-100.8 F (38.2 C)] 99.9 F (37.7 C) (03/31 0900) Pulse Rate:  [71-124] 114 (03/31 0900) Resp:  [18-29] 27 (03/31 0900) BP: (96-154)/(52-100) 146/75 (03/31 0900) SpO2:  [90 %-100 %] 96 % (03/31 0900) FiO2 (%):  [40 %] 40 % (03/31 0800) Weight:  [115.1 kg] 115.1 kg (03/31 0346)  General: lying in bed, intubated Neuro: On Versed at 10 mL/h, comatose, does not open eyes noxious stimuli, does not follow commands, pupils equally round and reactive to light, corneal reflex intact, cough reflex intact, does not withdraw to noxious stimuli in all 4 extremities  Basic Metabolic Panel: Recent Labs  Lab 03/06/24 0536 03/07/24 0520 03/08/24 0337 03/08/24 0904 03/09/24 0244 03/09/24 0920 03/09/24 1211 03/10/24 0756 03/11/24 0303 03/12/24 0610  NA 130*   < > 134*   < > 135 136 137 135 135 138  K 3.7   < > 3.8  --   --  3.7  --  3.9 4.1 4.0  CL 95*   < > 99  --   --  101  --  104 104 105  CO2 28   < > 23  --   --  25  --  23 22 28   GLUCOSE 205*   < > 116*  --   --  168*  --  323* 211* 293*  BUN 20   < > 21*  --   --  23*  --  21* 24* 17  CREATININE 0.83   < > 1.15*  --   --  0.94  --  1.23* 1.07* 1.03*  CALCIUM 8.8*   < > 8.9  --   --  8.5*  --  8.3* 8.1* 8.4*  MG 1.9  --  2.1  --  2.1  --   --  2.1 2.2  --   PHOS 3.0  --   --   --  3.3 2.8  --  3.1 3.1 1.7*   < > = values in this interval not displayed.    CBC: Recent Labs  Lab 03/08/24 0337 03/09/24 0919 03/10/24 0756 03/11/24 0303 03/12/24 0610  WBC 20.7* 15.7* 26.1* 28.5* 19.4*  NEUTROABS 10.8*  --  20.6* 22.2* 15.5*  HGB 12.6 12.5 11.5* 10.4* 10.9*  HCT 40.3 40.2 37.2 33.9* 34.6*  MCV 84.7 86.5 86.9 88.1 88.0  PLT 177 187 222 174 194     Coagulation Studies: No results for input(s): "LABPROT", "INR" in the last 72  hours.  Imaging No new brain imaging overnight   ASSESSMENT AND PLAN: 58 y.o. 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, concern for focal status epilepticus on 03/07/2024  Convulsive focal status epilepticus, resolved Subclinical seizures, resolved as of 2155 on 03/09/2024 Subdural hematoma Right temporal hemorrhage Acute encephalopathy due to seizures/medications   Recommendations -Wean Versed down to 10 mL/h.  Will review EEG again around 1400 and if no seizures, will stop Versed. -Continue LTM EEG for at least 24 hours after weaning sedation to look for seizure recurrence.  Will consider discontinuing tomorrow -Continue  Gabapentin 300mg  every 8 hours,  Vimpat 200 mg daily Keppra 1500 mg twice daily and  Depakote 500 mg every 8 hours; last level therapeutic on 03/10/2024 -Consider Fycompa loading dose if  seizures recur at the next line agent -Discussed plan with ICU team    CRITICAL CARE Performed by: Charlsie Quest   Total critical care time: 35 minutes  Critical care time was exclusive of separately billable procedures and treating other patients.  Critical care was necessary to treat or prevent imminent or life-threatening deterioration.  Critical care was time spent personally by me on the following activities: development of treatment plan with patient and/or surrogate as well as nursing, discussions with consultants, evaluation of patient's response to treatment, examination of patient, obtaining history from patient or surrogate, ordering and performing treatments and interventions, ordering and review of laboratory studies, ordering and review of radiographic studies, pulse oximetry and re-evaluation of patient's condition.     Lindie Spruce Epilepsy [facility] For questions after 5pm please refer to AMION to reach the Neurologist on call

## 2024-03-12 NOTE — Progress Notes (Signed)
 Nutrition Follow-up  DOCUMENTATION CODES:  Obesity unspecified  INTERVENTION:  Adjust tube feeding via OGT: Glucerna 1.5 at 55 ml/h (1320 ml per day) Provides 1980 kcal, 109 gm protein, 1002 ml free water daily Discussed insulin regimen with care team  NUTRITION DIAGNOSIS:  Inadequate oral intake related to inability to eat as evidenced by NPO status. - remains applicable  GOAL:  Patient will meet greater than or equal to 90% of their needs - progressing, TF at goal  MONITOR:  TF tolerance, Labs, Vent status  REASON FOR ASSESSMENT:  Consult Enteral/tube feeding initiation and management  ASSESSMENT:  Pt with hx of prior CVA, seizures, CHF, HTN, T2DM, OSA on CPAP, and GERD. 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 Admitted from home after progressive headaches and vomiting. 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  3/21 admitted to ICU for SDH, on cleviprex 3/24 off cleviprex, transferred out of ICU to IMTS 3/25 seizure activity 3/26 re-admitted to MICU, ventilated 3/27 EEG, lasix for diuresis  Patient is currently intubated on ventilator support. Undergoing EEG at the time of visit. Brother visiting at bedside this AM.  CBGs very elevated since TF ordered for pt last week. Reviewed chart and appears insulin regimen has not been adjusted since 3/26 which was prior to pt's initiation of TF. DM coordinator to review changes to insulin regimen today. Will also adjust formula as pt is stable with good MAP and no pressor support.   MV: 12.2 L/min Temp (24hrs), Avg:99.2 F (37.3 C), Min:97 F (36.1 C), Max:100.8 F (38.2 C) MAP (cuff):  Admit weight: 111.1 kg  Current weight: 115.1 kg +5.2L this admission   Intake/Output Summary (Last 24 hours) at 03/12/2024 1028 Last data filed at 03/12/2024 0900 Gross per 24 hour  Intake 2063.26 ml  Output 1975 ml  Net 88.26 ml  Net IO Since Admission: 5,296.57 mL [03/12/24  1028]  Drains/Lines: OGT ETT UOP 1.820  Average Meal Intake: 3/22-3/24: 28% intake x 3 recorded meals  Nutritionally Relevant Medications: Scheduled Meds:  atorvastatin  40 mg Per Tube Daily   PROSource TF20  60 mL Per Tube Daily   insulin aspart  0-20 Units Subcutaneous Q4H   insulin aspart  8 Units Subcutaneous Q4H   insulin glargine-yfgn  25 Units Subcutaneous Daily   lactulose  15 g Per Tube BID   pantoprazole IV  40 mg Intravenous Q24H   polyethylene glycol  17 g Per Tube BID   senna  1 tablet Per Tube BID   thiamine  100 mg Per Tube Daily   Continuous Infusions:  cefTRIAXone (ROCEPHIN)  IV Stopped (03/11/24 1342)   feeding supplement (OSMOLITE 1.5 CAL) 55 mL/hr at 03/12/24 0900   PRN Meds: polyethylene glycol  Labs Reviewed: Creatinine 1.03 CBG ranges from 189-307 mg/dL over the last 24 hours HgbA1c 11.3%  NUTRITION - FOCUSED PHYSICAL EXAM: Flowsheet Row Most Recent Value  Orbital Region No depletion  Upper Arm Region No depletion  Thoracic and Lumbar Region No depletion  Buccal Region No depletion  Temple Region Unable to assess  [EEG]  Clavicle Bone Region No depletion  Clavicle and Acromion Bone Region No depletion  Scapular Bone Region Unable to assess  Dorsal Hand Unable to assess  Patellar Region No depletion  Anterior Thigh Region No depletion  Posterior Calf Region No depletion  Edema (RD Assessment) Mild  [non pitting]  Hair Reviewed  Eyes Unable to assess  Mouth Unable to assess  Skin Reviewed  Nails Unable to assess   Diet Order:   Diet Order             Diet NPO time specified  Diet effective now                   EDUCATION NEEDS:  Not appropriate for education at this time  Skin:  Skin Assessment: Reviewed RN Assessment (bruising with some abrasions arms and legs)  Last BM:  3/31 - type 3  Height:  Ht Readings from Last 1 Encounters:  03/08/24 5\' 9"  (1.753 m)    Weight:  Wt Readings from Last 1 Encounters:  03/12/24  115.1 kg    Ideal Body Weight:  66 kg  BMI:  Body mass index is 37.47 kg/m.  Estimated Nutritional Needs:  Kcal:  1900-2100 Protein:  95-110g Fluid:  >2L    Greig Castilla, RD, LDN Registered Dietitian II Please reach out via secure chat

## 2024-03-12 NOTE — Plan of Care (Signed)
  Problem: Fluid Volume: Goal: Ability to maintain a balanced intake and output will improve Outcome: Progressing   Problem: Nutritional: Goal: Maintenance of adequate nutrition will improve Outcome: Progressing   Problem: Health Behavior/Discharge Planning: Goal: Ability to manage health-related needs will improve Outcome: Not Progressing   Problem: Metabolic: Goal: Ability to maintain appropriate glucose levels will improve Outcome: Not Progressing   Problem: Nutritional: Goal: Progress toward achieving an optimal weight will improve Outcome: Not Progressing   Problem: Skin Integrity: Goal: Risk for impaired skin integrity will decrease Outcome: Not Progressing   Problem: Tissue Perfusion: Goal: Adequacy of tissue perfusion will improve Outcome: Not Progressing   Problem: Education: Goal: Knowledge of General Education information will improve Description: Including pain rating scale, medication(s)/side effects and non-pharmacologic comfort measures Outcome: Not Progressing   Problem: Health Behavior/Discharge Planning: Goal: Ability to manage health-related needs will improve Outcome: Not Progressing   Problem: Clinical Measurements: Goal: Ability to maintain clinical measurements within normal limits will improve Outcome: Not Progressing Goal: Will remain free from infection Outcome: Not Progressing Goal: Diagnostic test results will improve Outcome: Not Progressing Goal: Respiratory complications will improve Outcome: Not Progressing Goal: Cardiovascular complication will be avoided Outcome: Not Progressing   Problem: Activity: Goal: Risk for activity intolerance will decrease Outcome: Not Progressing   Problem: Nutrition: Goal: Adequate nutrition will be maintained Outcome: Not Progressing   Problem: Coping: Goal: Level of anxiety will decrease Outcome: Not Progressing   Problem: Elimination: Goal: Will not experience complications related to bowel  motility Outcome: Not Progressing Goal: Will not experience complications related to urinary retention Outcome: Not Progressing   Problem: Pain Managment: Goal: General experience of comfort will improve and/or be controlled Outcome: Not Progressing   Problem: Safety: Goal: Ability to remain free from injury will improve Outcome: Not Progressing   Problem: Skin Integrity: Goal: Risk for impaired skin integrity will decrease Outcome: Not Progressing   Problem: Health Behavior/Discharge Planning: Goal: Compliance with prescribed medication regimen will improve Outcome: Not Progressing   Problem: Medication: Goal: Risk for medication side effects will decrease Outcome: Not Progressing   Problem: Clinical Measurements: Goal: Complications related to the disease process, condition or treatment will be avoided or minimized Outcome: Not Progressing Goal: Diagnostic test results will improve Outcome: Not Progressing   Problem: Safety: Goal: Verbalization of understanding the information provided will improve Outcome: Not Progressing   Problem: Coping: Goal: Ability to adjust to condition or change in health will improve Outcome: Not Applicable   Problem: Education: Goal: Expressions of having a comfortable level of knowledge regarding the disease process will increase Outcome: Not Applicable   Problem: Coping: Goal: Ability to adjust to condition or change in health will improve Outcome: Not Applicable

## 2024-03-12 NOTE — Progress Notes (Signed)
 LTM maint complete - no skin breakdown under:  Fp2, C3

## 2024-03-13 ENCOUNTER — Inpatient Hospital Stay (HOSPITAL_COMMUNITY)

## 2024-03-13 DIAGNOSIS — G9340 Encephalopathy, unspecified: Secondary | ICD-10-CM | POA: Diagnosis not present

## 2024-03-13 DIAGNOSIS — S0634AA Traumatic hemorrhage of right cerebrum with loss of consciousness status unknown, initial encounter: Secondary | ICD-10-CM | POA: Diagnosis not present

## 2024-03-13 DIAGNOSIS — R569 Unspecified convulsions: Secondary | ICD-10-CM | POA: Diagnosis not present

## 2024-03-13 DIAGNOSIS — B9629 Other Escherichia coli [E. coli] as the cause of diseases classified elsewhere: Secondary | ICD-10-CM | POA: Diagnosis not present

## 2024-03-13 DIAGNOSIS — M7989 Other specified soft tissue disorders: Secondary | ICD-10-CM

## 2024-03-13 DIAGNOSIS — R509 Fever, unspecified: Secondary | ICD-10-CM

## 2024-03-13 DIAGNOSIS — S065XAA Traumatic subdural hemorrhage with loss of consciousness status unknown, initial encounter: Secondary | ICD-10-CM | POA: Diagnosis not present

## 2024-03-13 LAB — GLUCOSE, CAPILLARY
Glucose-Capillary: 183 mg/dL — ABNORMAL HIGH (ref 70–99)
Glucose-Capillary: 192 mg/dL — ABNORMAL HIGH (ref 70–99)
Glucose-Capillary: 204 mg/dL — ABNORMAL HIGH (ref 70–99)
Glucose-Capillary: 216 mg/dL — ABNORMAL HIGH (ref 70–99)
Glucose-Capillary: 249 mg/dL — ABNORMAL HIGH (ref 70–99)
Glucose-Capillary: 191 mg/dL — ABNORMAL HIGH (ref 70–99)

## 2024-03-13 LAB — CBC WITH DIFFERENTIAL/PLATELET
Abs Immature Granulocytes: 0.38 10*3/uL — ABNORMAL HIGH (ref 0.00–0.07)
Basophils Absolute: 0.1 10*3/uL (ref 0.0–0.1)
Basophils Relative: 0 %
Eosinophils Absolute: 0.1 10*3/uL (ref 0.0–0.5)
Eosinophils Relative: 0 %
HCT: 40.4 % (ref 36.0–46.0)
Hemoglobin: 12.6 g/dL (ref 12.0–15.0)
Immature Granulocytes: 2 %
Lymphocytes Relative: 10 %
Lymphs Abs: 2.6 10*3/uL (ref 0.7–4.0)
MCH: 27.2 pg (ref 26.0–34.0)
MCHC: 31.2 g/dL (ref 30.0–36.0)
MCV: 87.3 fL (ref 80.0–100.0)
Monocytes Absolute: 2.7 10*3/uL — ABNORMAL HIGH (ref 0.1–1.0)
Monocytes Relative: 11 %
Neutro Abs: 19.3 10*3/uL — ABNORMAL HIGH (ref 1.7–7.7)
Neutrophils Relative %: 77 %
Platelets: 225 10*3/uL (ref 150–400)
RBC: 4.63 MIL/uL (ref 3.87–5.11)
RDW: 13.1 % (ref 11.5–15.5)
WBC: 25.1 10*3/uL — ABNORMAL HIGH (ref 4.0–10.5)
nRBC: 0 % (ref 0.0–0.2)

## 2024-03-13 LAB — AMMONIA: Ammonia: 51 umol/L — ABNORMAL HIGH (ref 9–35)

## 2024-03-13 LAB — URINE CULTURE: Culture: >=100000 — AB

## 2024-03-13 LAB — BASIC METABOLIC PANEL WITH GFR
Anion gap: 14 (ref 5–15)
BUN: 13 mg/dL (ref 6–20)
CO2: 20 mmol/L — ABNORMAL LOW (ref 22–32)
Calcium: 8.6 mg/dL — ABNORMAL LOW (ref 8.9–10.3)
Chloride: 106 mmol/L (ref 98–111)
Creatinine, Ser: 0.79 mg/dL (ref 0.44–1.00)
GFR, Estimated: >60 mL/min (ref >60)
Glucose, Bld: 209 mg/dL — ABNORMAL HIGH (ref 70–99)
Potassium: 4.7 mmol/L (ref 3.5–5.1)
Sodium: 140 mmol/L (ref 135–145)

## 2024-03-13 LAB — VALPROIC ACID LEVEL: Valproic Acid Lvl: 77 ug/mL (ref 50.0–100.0)

## 2024-03-13 LAB — LACTIC ACID, PLASMA
Lactic Acid, Venous: 2 mmol/L (ref 0.5–1.9)
Lactic Acid, Venous: 1.4 mmol/L (ref 0.5–1.9)

## 2024-03-13 LAB — PHOSPHORUS: Phosphorus: 2.7 mg/dL (ref 2.5–4.6)

## 2024-03-13 MED ORDER — INSULIN ASPART 100 UNIT/ML IJ SOLN
10.0000 [IU] | INTRAMUSCULAR | Status: DC
  Administered 2024-03-13 – 2024-04-05 (×126): 10 [IU] via SUBCUTANEOUS

## 2024-03-13 MED ORDER — LACTATED RINGERS IV BOLUS
500.0000 mL | Freq: Once | INTRAVENOUS | Status: AC
  Administered 2024-03-13: 500 mL via INTRAVENOUS

## 2024-03-13 MED ORDER — METOPROLOL TARTRATE 25 MG PO TABS
25.0000 mg | ORAL_TABLET | Freq: Two times a day (BID) | ORAL | Status: DC
  Administered 2024-03-13 – 2024-03-14 (×4): 25 mg
  Filled 2024-03-13 (×4): qty 1

## 2024-03-13 MED ORDER — VALPROIC ACID 250 MG/5ML PO SOLN
500.0000 mg | Freq: Three times a day (TID) | ORAL | Status: DC
  Administered 2024-03-13 – 2024-03-14 (×3): 500 mg
  Filled 2024-03-13 (×3): qty 10

## 2024-03-13 MED ORDER — PIPERACILLIN-TAZOBACTAM 3.375 G IVPB
3.3750 g | Freq: Three times a day (TID) | INTRAVENOUS | Status: DC
  Administered 2024-03-13 (×2): 3.375 g via INTRAVENOUS
  Filled 2024-03-13 (×2): qty 50

## 2024-03-13 MED ORDER — INSULIN GLARGINE-YFGN 100 UNIT/ML ~~LOC~~ SOLN
30.0000 [IU] | Freq: Every day | SUBCUTANEOUS | Status: DC
  Administered 2024-03-13 – 2024-04-04 (×23): 30 [IU] via SUBCUTANEOUS
  Filled 2024-03-13 (×25): qty 0.3

## 2024-03-13 MED ORDER — PIPERACILLIN-TAZOBACTAM 3.375 G IVPB
3.3750 g | Freq: Three times a day (TID) | INTRAVENOUS | Status: DC
  Administered 2024-03-13 – 2024-03-19 (×19): 3.375 g via INTRAVENOUS
  Filled 2024-03-13 (×18): qty 50

## 2024-03-13 MED ORDER — DIVALPROEX SODIUM 500 MG PO DR TAB: 750.0000 mg | DELAYED_RELEASE_TABLET | Freq: Two times a day (BID) | ORAL | Status: DC

## 2024-03-13 NOTE — Procedures (Signed)
 Patient Name: [name]  MRN: [id number]  Epilepsy Attending: Charlsie Quest  Referring Physician/Provider: Gordy Councilman, MD  Duration: 03/12/2024 1502 to 03/13/2024 1502   Patient history: 58 y.o. 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, concern for focal status epilepticus this morning. EEG to evaluate for seizure   Level of alertness: comatose   AEDs during EEG study: LEV, LCM, VPA,   Technical aspects: This EEG study was done with scalp electrodes positioned according to the 10-20 International system of electrode placement. Electrical activity was reviewed with band pass filter of 1-70Hz , sensitivity of 7 uV/mm, display speed of 57mm/sec with a 60Hz  notched filter applied as appropriate. EEG data were recorded continuously and digitally stored.  Video monitoring was available and reviewed as appropriate.   Description: EEG showed near continuous generalized polymorphic mixed frequencies with 3 to 7 Hz theta-delta slowing admixed with 12 to 14 Hz beta activity. Brief 1 to 2 seconds of generalized EEG attenuation was also noted at times. Hyperventilation and photic stimulation were not performed.      ABNORMALITY -Continuous slow, generalized   IMPRESSION: This study was suggestive of severe diffuse encephalopathy.  No seizure were noted.   Ritchard Paragas Annabelle Harman

## 2024-03-13 NOTE — Progress Notes (Addendum)
 Subjective: Tmax one 101.3 Fahrenheit  ROS: Unable to obtain due to poor mental status  Examination  Vital signs in last 24 hours: Temp:  [99.7 F (37.6 C)-101.3 F (38.5 C)] 100.6 F (38.1 C) (04/01 1000) Pulse Rate:  [109-125] 120 (04/01 1000) Resp:  [13-35] 26 (04/01 1000) BP: (83-171)/(54-124) 140/75 (04/01 1000) SpO2:  [90 %-99 %] 98 % (04/01 1000) FiO2 (%):  [40 %] 40 % (04/01 0745) Weight:  [115.9 kg] 115.9 kg (04/01 0500)  General: lying in bed, intubated Neuro: comatose, does not open eyes noxious stimuli, does not follow commands, pupils equally round and reactive to light, corneal reflex intact, cough reflex intact, does not withdraw to noxious stimuli in all 4 extremities  Basic Metabolic Panel: Recent Labs  Lab 03/08/24 0337 03/08/24 0904 03/09/24 0244 03/09/24 0920 03/09/24 1211 03/10/24 0756 03/11/24 0303 03/12/24 0610 03/13/24 0238  NA 134*   < > 135 136 137 135 135 138 140  K 3.8  --   --  3.7  --  3.9 4.1 4.0 4.7  CL 99  --   --  101  --  104 104 105 106  CO2 23  --   --  25  --  23 22 28  20*  GLUCOSE 116*  --   --  168*  --  323* 211* 293* 209*  BUN 21*  --   --  23*  --  21* 24* 17 13  CREATININE 1.15*  --   --  0.94  --  1.23* 1.07* 1.03* 0.79  CALCIUM 8.9  --   --  8.5*  --  8.3* 8.1* 8.4* 8.6*  MG 2.1  --  2.1  --   --  2.1 2.2  --   --   PHOS  --    < > 3.3 2.8  --  3.1 3.1 1.7* 2.7   < > = values in this interval not displayed.    CBC: Recent Labs  Lab 03/08/24 0337 03/09/24 0919 03/10/24 0756 03/11/24 0303 03/12/24 0610 03/13/24 0238  WBC 20.7* 15.7* 26.1* 28.5* 19.4* 25.1*  NEUTROABS 10.8*  --  20.6* 22.2* 15.5* 19.3*  HGB 12.6 12.5 11.5* 10.4* 10.9* 12.6  HCT 40.3 40.2 37.2 33.9* 34.6* 40.4  MCV 84.7 86.5 86.9 88.1 88.0 87.3  PLT 177 187 222 174 194 225     Coagulation Studies: No results for input(s): "LABPROT", "INR" in the last 72 hours.  Imaging No new brain imaging overnight  ASSESSMENT AND PLAN: 58 y.o. female  with breakthrough seizures in the setting of increased cortical irritation secondary to traumatic SDH, concern for focal status epilepticus on 03/07/2024   Convulsive focal status epilepticus, resolved Subclinical seizures, resolved as of 2155 on 03/09/2024 Subdural hematoma Right temporal hemorrhage Acute encephalopathy due to seizures/medications Fever -No seizures overnight.  Sedation stopped on 03/12/2024 around 3 PM -Plan to DC LTM EEG tomorrow if no seizures -Continue  Gabapentin 300mg  every 8 hours, Vimpat 200 mg daily, Keppra 1500 mg twice daily  - Switched IV Depakote 500 mg every 8 hours to Depakote solution via tube -Consider Fycompa loading dose if seizures recur at the next line agent -Will wait for about 72 hours after turning off sedation (03/15/2024 to look for improvement in mental status) -Management of fever per primary team -Discussed plan with ICU team      I have spent a total of   36 minutes with the patient reviewing hospital notes,  test results, labs and  examining the patient as well as establishing an assessment and plan. > 50% of time was spent in direct patient care.    [name] Epilepsy [facility] For questions after 5pm please refer to AMION to reach the Neurologist on call

## 2024-03-13 NOTE — Progress Notes (Signed)
 vLTM maintenance  All impedances below 10k.  No skin breakdown noted at A1  A2  T4  F8

## 2024-03-13 NOTE — Telephone Encounter (Signed)
 Copied from CRM [id number]. Topic: General - Other >> Mar 09, 2024 12:25 PM Hamdi H wrote: Reason for CRM: Gerilyn Pilgrim from [facility] called to check in on the status of the faxes they sent over. This is in regards to incontinence supplies for the patient. Best call back number is [phone].  Duplicate request  [employer] form has been signed as of today and to be faxed with the changes.

## 2024-03-13 NOTE — Progress Notes (Signed)
 Right upper extremity venous duplex has been completed.  Results can be found in chart review under CV Proc. and relayed to RN.  03/13/2024 1:22 PM  Traeton Bordas Durenda Age, RVT.
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
## 2024-03-13 NOTE — Progress Notes (Signed)
 Pharmacy Antibiotic Note  [name] is a 58 y.o. female admitted on 03/02/2024 with acute subdural hematoma due to trauma on Xarelto. Has now developed sepsis and UTI.  Pharmacy has been consulted for Zosyn dosing.  WBC rising, fever worse despite Ceftriaxone.   Plan: Start Zosyn 3.375g IV q8h (4 hour infusion). Follow cultures, duration of therapy   Height: 5\' 9"  (175.3 cm) Weight: 115.9 kg (255 lb 8.2 oz) IBW/kg (Calculated) : 66.2  Temp (24hrs), Avg:100.5 F (38.1 C), Min:99.7 F (37.6 C), Max:101.3 F (38.5 C)  Recent Labs  Lab 03/09/24 0919 03/09/24 0920 03/10/24 0756 03/11/24 0303 03/12/24 0610 03/13/24 0238  WBC 15.7*  --  26.1* 28.5* 19.4* 25.1*  CREATININE  --  0.94 1.23* 1.07* 1.03* 0.79    Estimated Creatinine Clearance: 105.5 mL/min (by C-G formula based on SCr of 0.79 mg/dL).    Allergies  Allergen Reactions   Ace Inhibitors Anaphylaxis and Other (See Comments)    Swelling of the tongue and throat    Cleocin [Clindamycin] Anaphylaxis and Swelling   Enalapril Maleate Anaphylaxis and Other (See Comments)    Swelling of the tongue and throat    Zestril [Lisinopril] Anaphylaxis and Other (See Comments)    Swelling of the tongue and throat     Antimicrobials this admission: Ceftriaxone 3/30 >>3/31 Zosyn 4/1 >>   Microbiology results: 3/26 MRSA PCR - negative 3/30 BCx - NGTD  3/30 UCx - >100K E.coli  Thank you for allowing pharmacy to be a part of this patient's care.  Cedric Fishman 03/13/2024 8:17 AM

## 2024-03-14 ENCOUNTER — Inpatient Hospital Stay (HOSPITAL_COMMUNITY)

## 2024-03-14 ENCOUNTER — Encounter: Admitting: Student

## 2024-03-14 DIAGNOSIS — S0634AA Traumatic hemorrhage of right cerebrum with loss of consciousness status unknown, initial encounter: Secondary | ICD-10-CM | POA: Diagnosis not present

## 2024-03-14 DIAGNOSIS — R69 Illness, unspecified: Secondary | ICD-10-CM | POA: Diagnosis not present

## 2024-03-14 DIAGNOSIS — I4891 Unspecified atrial fibrillation: Secondary | ICD-10-CM

## 2024-03-14 DIAGNOSIS — I5020 Unspecified systolic (congestive) heart failure: Secondary | ICD-10-CM

## 2024-03-14 DIAGNOSIS — G40901 Epilepsy, unspecified, not intractable, with status epilepticus: Secondary | ICD-10-CM | POA: Diagnosis not present

## 2024-03-14 DIAGNOSIS — G9340 Encephalopathy, unspecified: Secondary | ICD-10-CM | POA: Diagnosis not present

## 2024-03-14 DIAGNOSIS — R569 Unspecified convulsions: Secondary | ICD-10-CM | POA: Diagnosis not present

## 2024-03-14 DIAGNOSIS — B9629 Other Escherichia coli [E. coli] as the cause of diseases classified elsewhere: Secondary | ICD-10-CM | POA: Diagnosis not present

## 2024-03-14 DIAGNOSIS — Z515 Encounter for palliative care: Secondary | ICD-10-CM | POA: Diagnosis not present

## 2024-03-14 DIAGNOSIS — R509 Fever, unspecified: Secondary | ICD-10-CM | POA: Diagnosis not present

## 2024-03-14 DIAGNOSIS — S065XAA Traumatic subdural hemorrhage with loss of consciousness status unknown, initial encounter: Secondary | ICD-10-CM | POA: Diagnosis not present

## 2024-03-14 LAB — RENAL FUNCTION PANEL
Albumin: 2.3 g/dL — ABNORMAL LOW (ref 3.5–5.0)
Anion gap: 10 (ref 5–15)
BUN: 14 mg/dL (ref 6–20)
CO2: 27 mmol/L (ref 22–32)
Calcium: 8.4 mg/dL — ABNORMAL LOW (ref 8.9–10.3)
Chloride: 102 mmol/L (ref 98–111)
Creatinine, Ser: 0.76 mg/dL (ref 0.44–1.00)
GFR, Estimated: >60 mL/min
Glucose, Bld: 173 mg/dL — ABNORMAL HIGH (ref 70–99)
Phosphorus: 3.4 mg/dL (ref 2.5–4.6)
Potassium: 3.9 mmol/L (ref 3.5–5.1)
Sodium: 139 mmol/L (ref 135–145)

## 2024-03-14 LAB — CBC WITH DIFFERENTIAL/PLATELET
Abs Immature Granulocytes: 0.24 10*3/uL — ABNORMAL HIGH (ref 0.00–0.07)
Basophils Absolute: 0.1 10*3/uL (ref 0.0–0.1)
Basophils Relative: 0 %
Eosinophils Absolute: 0.1 10*3/uL (ref 0.0–0.5)
Eosinophils Relative: 1 %
HCT: 37.2 % (ref 36.0–46.0)
Hemoglobin: 11.6 g/dL — ABNORMAL LOW (ref 12.0–15.0)
Immature Granulocytes: 1 %
Lymphocytes Relative: 11 %
Lymphs Abs: 2.1 10*3/uL (ref 0.7–4.0)
MCH: 26.9 pg (ref 26.0–34.0)
MCHC: 31.2 g/dL (ref 30.0–36.0)
MCV: 86.3 fL (ref 80.0–100.0)
Monocytes Absolute: 1.9 10*3/uL — ABNORMAL HIGH (ref 0.1–1.0)
Monocytes Relative: 10 %
Neutro Abs: 13.9 10*3/uL — ABNORMAL HIGH (ref 1.7–7.7)
Neutrophils Relative %: 77 %
Platelets: 249 10*3/uL (ref 150–400)
RBC: 4.31 MIL/uL (ref 3.87–5.11)
RDW: 13 % (ref 11.5–15.5)
WBC: 18.3 10*3/uL — ABNORMAL HIGH (ref 4.0–10.5)
nRBC: 0 % (ref 0.0–0.2)

## 2024-03-14 LAB — GLUCOSE, CAPILLARY
Glucose-Capillary: 115 mg/dL — ABNORMAL HIGH (ref 70–99)
Glucose-Capillary: 118 mg/dL — ABNORMAL HIGH (ref 70–99)
Glucose-Capillary: 137 mg/dL — ABNORMAL HIGH (ref 70–99)
Glucose-Capillary: 155 mg/dL — ABNORMAL HIGH (ref 70–99)
Glucose-Capillary: 165 mg/dL — ABNORMAL HIGH (ref 70–99)
Glucose-Capillary: 172 mg/dL — ABNORMAL HIGH (ref 70–99)

## 2024-03-14 LAB — MAGNESIUM: Magnesium: 2 mg/dL (ref 1.7–2.4)

## 2024-03-14 MED ORDER — VALPROIC ACID 250 MG/5ML PO SOLN
500.0000 mg | Freq: Two times a day (BID) | ORAL | Status: DC
  Administered 2024-03-14 – 2024-03-23 (×18): 500 mg
  Filled 2024-03-14 (×18): qty 10

## 2024-03-14 MED ORDER — DEXTROSE 50 % IV SOLN
INTRAVENOUS | Status: AC
  Filled 2024-03-14: qty 50

## 2024-03-14 MED ORDER — FLUCONAZOLE 200 MG PO TABS
200.0000 mg | ORAL_TABLET | Freq: Every day | ORAL | Status: AC
  Administered 2024-03-14 – 2024-03-20 (×7): 200 mg
  Filled 2024-03-14 (×7): qty 1

## 2024-03-14 MED ORDER — GERHARDT'S BUTT CREAM
TOPICAL_CREAM | Freq: Two times a day (BID) | CUTANEOUS | Status: DC
  Administered 2024-03-19 – 2024-04-12 (×8): 1 via TOPICAL
  Filled 2024-03-14 (×5): qty 60
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
## 2024-03-14 NOTE — Progress Notes (Signed)
 Pt vomited what looked like tube feedings. Tube feeding held at this time. MD aware. KUB to be ordered after CT.

## 2024-03-14 NOTE — Progress Notes (Signed)
 MB performed maintenance on P3, P4, F7, F8, and PZ electrodes. All impedances are below 10k ohms. No skin breakdown noted.

## 2024-03-14 NOTE — Progress Notes (Signed)
 Subjective: Tmax one 101.3 Fahrenheit overnight again.    ROS: Unable to obtain due to poor mental status  Examination  Vital signs in last 24 hours: Temp:  [99.1 F (37.3 C)-101.3 F (38.5 C)] 101.1 F (38.4 C) (04/02 0900) Pulse Rate:  [91-120] 114 (04/02 0900) Resp:  [16-27] 21 (04/02 0900) BP: (115-161)/(61-92) 142/92 (04/02 0900) SpO2:  [95 %-100 %] 97 % (04/02 0900) FiO2 (%):  [40 %] 40 % (04/02 0813) Weight:  [119.2 kg] 119.2 kg (04/02 0500)  General: lying in bed, intubated Neuro: comatose, does open eyes briefly to noxious stimuli, does not follow commands, pupils equally round and reactive to light, corneal reflex intact, cough reflex intact, subtle withdrawal to noxious stimuli in LUE, does not withdraw to noxious stimuli in all other extremities  Basic Metabolic Panel: Recent Labs  Lab 03/08/24 0337 03/08/24 0904 03/09/24 0244 03/09/24 0920 03/10/24 0756 03/11/24 0303 03/12/24 0610 03/13/24 0238 03/14/24 0419  NA 134*   < > 135   < > 135 135 138 140 139  K 3.8  --   --    < > 3.9 4.1 4.0 4.7 3.9  CL 99  --   --    < > 104 104 105 106 102  CO2 23  --   --    < > 23 22 28  20* 27  GLUCOSE 116*  --   --    < > 323* 211* 293* 209* 173*  BUN 21*  --   --    < > 21* 24* 17 13 14   CREATININE 1.15*  --   --    < > 1.23* 1.07* 1.03* 0.79 0.76  CALCIUM 8.9  --   --    < > 8.3* 8.1* 8.4* 8.6* 8.4*  MG 2.1  --  2.1  --  2.1 2.2  --   --  2.0  PHOS  --   --  3.3   < > 3.1 3.1 1.7* 2.7 3.4   < > = values in this interval not displayed.    CBC: Recent Labs  Lab 03/10/24 0756 03/11/24 0303 03/12/24 0610 03/13/24 0238 03/14/24 0419  WBC 26.1* 28.5* 19.4* 25.1* 18.3*  NEUTROABS 20.6* 22.2* 15.5* 19.3* 13.9*  HGB 11.5* 10.4* 10.9* 12.6 11.6*  HCT 37.2 33.9* 34.6* 40.4 37.2  MCV 86.9 88.1 88.0 87.3 86.3  PLT 222 174 194 225 249    Coagulation Studies: No results for input(s): "LABPROT", "INR" in the last 72 hours.  Imaging No new brain imaging overnight    ASSESSMENT AND PLAN: 58 y.o. 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, concern for focal status epilepticus on 03/07/2024   Convulsive focal status epilepticus, resolved Subclinical seizures, resolved as of 2155 on 03/09/2024 Subdural hematoma Right temporal hemorrhage Acute encephalopathy due to seizures/medications Fever -No seizures overnight.  Sedation stopped on 03/12/2024 around 3 PM -DC LTM as no further seizures -Continue  Gabapentin 300mg  every 8 hours, Vimpat 200 mg daily, Keppra 1500 mg twice daily  - Reduce VPA to 500mg  BID due to hyperammonemia - Will repeat CTH tomorrow if remains comatose -Management of fever per primary team -Discussed plan with ICU team    I have spent a total of  35 minutes with the patient reviewing hospital notes,  test results, labs and examining the patient as well as establishing an assessment and plan. > 50% of time was spent in direct patient care.  Lindie Spruce Epilepsy [facility] For questions after 5pm please refer to AMION to reach the Neurologist on call
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
## 2024-03-14 NOTE — Procedures (Addendum)
 Patient Name: [name]  MRN: [id number]  Epilepsy Attending: Charlsie Quest  Referring Physician/Provider: Gordy Councilman, MD  Duration: 03/13/2024 1502 to 03/14/2024 6578   Patient history: 58 y.o. 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, concern for focal status epilepticus this morning. EEG to evaluate for seizure   Level of alertness: comatose   AEDs during EEG study: LEV, LCM, VPA,   Technical aspects: This EEG study was done with scalp electrodes positioned according to the 10-20 International system of electrode placement. Electrical activity was reviewed with band pass filter of 1-70Hz , sensitivity of 7 uV/mm, display speed of 3mm/sec with a 60Hz  notched filter applied as appropriate. EEG data were recorded continuously and digitally stored.  Video monitoring was available and reviewed as appropriate.   Description: EEG showed near continuous generalized and maximal right frontotemporal region polymorphic mixed frequencies with 3 to 7 Hz theta-delta slowing admixed with 12 to 14 Hz beta activity. Hyperventilation and photic stimulation were not performed.      ABNORMALITY -Continuous slow, generalized and maximal right frontotemporal region   IMPRESSION: This study was suggestive of cortical dysfunction in right frontotemporal region likely secondary to underlying structural abnormality. Additionally there was severe diffuse encephalopathy.  No seizure were noted.   Artrell Lawless Annabelle Harman

## 2024-03-14 NOTE — Progress Notes (Signed)
 RT transported patient from 2M11 to CT and back with RN. No complications. RT will continue to monitor.

## 2024-03-14 NOTE — Progress Notes (Signed)
 LTM EEG discontinued - no skin breakdown at [facility].
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
## 2024-03-15 ENCOUNTER — Inpatient Hospital Stay (HOSPITAL_COMMUNITY)

## 2024-03-15 DIAGNOSIS — Z515 Encounter for palliative care: Secondary | ICD-10-CM | POA: Diagnosis not present

## 2024-03-15 DIAGNOSIS — S065XAA Traumatic subdural hemorrhage with loss of consciousness status unknown, initial encounter: Secondary | ICD-10-CM | POA: Diagnosis not present

## 2024-03-15 DIAGNOSIS — B9629 Other Escherichia coli [E. coli] as the cause of diseases classified elsewhere: Secondary | ICD-10-CM | POA: Diagnosis not present

## 2024-03-15 DIAGNOSIS — R509 Fever, unspecified: Secondary | ICD-10-CM | POA: Diagnosis not present

## 2024-03-15 DIAGNOSIS — G40901 Epilepsy, unspecified, not intractable, with status epilepticus: Secondary | ICD-10-CM | POA: Diagnosis not present

## 2024-03-15 DIAGNOSIS — S0634AA Traumatic hemorrhage of right cerebrum with loss of consciousness status unknown, initial encounter: Secondary | ICD-10-CM | POA: Diagnosis not present

## 2024-03-15 DIAGNOSIS — G9340 Encephalopathy, unspecified: Secondary | ICD-10-CM | POA: Diagnosis not present

## 2024-03-15 DIAGNOSIS — R569 Unspecified convulsions: Secondary | ICD-10-CM | POA: Diagnosis not present

## 2024-03-15 DIAGNOSIS — R69 Illness, unspecified: Secondary | ICD-10-CM | POA: Diagnosis not present

## 2024-03-15 LAB — CBC WITH DIFFERENTIAL/PLATELET
Abs Immature Granulocytes: 0.15 10*3/uL — ABNORMAL HIGH (ref 0.00–0.07)
Basophils Absolute: 0.1 10*3/uL (ref 0.0–0.1)
Basophils Relative: 0 %
Eosinophils Absolute: 0.1 10*3/uL (ref 0.0–0.5)
Eosinophils Relative: 1 %
HCT: 37.4 % (ref 36.0–46.0)
Hemoglobin: 11.6 g/dL — ABNORMAL LOW (ref 12.0–15.0)
Immature Granulocytes: 1 %
Lymphocytes Relative: 16 %
Lymphs Abs: 2.4 10*3/uL (ref 0.7–4.0)
MCH: 27 pg (ref 26.0–34.0)
MCHC: 31 g/dL (ref 30.0–36.0)
MCV: 87 fL (ref 80.0–100.0)
Monocytes Absolute: 1.8 10*3/uL — ABNORMAL HIGH (ref 0.1–1.0)
Monocytes Relative: 12 %
Neutro Abs: 11.1 10*3/uL — ABNORMAL HIGH (ref 1.7–7.7)
Neutrophils Relative %: 70 %
Platelets: 225 10*3/uL (ref 150–400)
RBC: 4.3 MIL/uL (ref 3.87–5.11)
RDW: 13.2 % (ref 11.5–15.5)
WBC: 15.7 10*3/uL — ABNORMAL HIGH (ref 4.0–10.5)
nRBC: 0 % (ref 0.0–0.2)

## 2024-03-15 LAB — RENAL FUNCTION PANEL
Albumin: 2.2 g/dL — ABNORMAL LOW (ref 3.5–5.0)
Anion gap: 9 (ref 5–15)
BUN: 14 mg/dL (ref 6–20)
CO2: 28 mmol/L (ref 22–32)
Calcium: 8.5 mg/dL — ABNORMAL LOW (ref 8.9–10.3)
Chloride: 101 mmol/L (ref 98–111)
Creatinine, Ser: 0.86 mg/dL (ref 0.44–1.00)
GFR, Estimated: >60 mL/min (ref >60)
Glucose, Bld: 166 mg/dL — ABNORMAL HIGH (ref 70–99)
Phosphorus: 3 mg/dL (ref 2.5–4.6)
Potassium: 4.5 mmol/L (ref 3.5–5.1)
Sodium: 138 mmol/L (ref 135–145)

## 2024-03-15 LAB — HEPATIC FUNCTION PANEL
ALT: 24 U/L (ref 0–44)
AST: 24 U/L (ref 15–41)
Albumin: 2.2 g/dL — ABNORMAL LOW (ref 3.5–5.0)
Alkaline Phosphatase: 66 U/L (ref 38–126)
Bilirubin, Direct: 0.1 mg/dL (ref 0.0–0.2)
Indirect Bilirubin: 0.4 mg/dL (ref 0.3–0.9)
Total Bilirubin: 0.5 mg/dL (ref 0.0–1.2)
Total Protein: 7 g/dL (ref 6.5–8.1)

## 2024-03-15 LAB — GLUCOSE, CAPILLARY
Glucose-Capillary: 120 mg/dL — ABNORMAL HIGH (ref 70–99)
Glucose-Capillary: 132 mg/dL — ABNORMAL HIGH (ref 70–99)
Glucose-Capillary: 136 mg/dL — ABNORMAL HIGH (ref 70–99)
Glucose-Capillary: 148 mg/dL — ABNORMAL HIGH (ref 70–99)
Glucose-Capillary: 162 mg/dL — ABNORMAL HIGH (ref 70–99)
Glucose-Capillary: 195 mg/dL — ABNORMAL HIGH (ref 70–99)

## 2024-03-15 MED ORDER — SODIUM CHLORIDE 0.9 % IV SOLN: INTRAVENOUS | Status: AC | PRN

## 2024-03-15 MED ORDER — METOPROLOL TARTRATE 25 MG PO TABS
50.0000 mg | ORAL_TABLET | Freq: Two times a day (BID) | ORAL | Status: DC
  Administered 2024-03-15 – 2024-03-21 (×14): 50 mg
  Filled 2024-03-15 (×15): qty 2

## 2024-03-15 MED ORDER — IRBESARTAN 150 MG PO TABS: 150.0000 mg | ORAL_TABLET | Freq: Every day | ORAL | Status: DC

## 2024-03-15 MED ORDER — HYDRALAZINE HCL 25 MG PO TABS
25.0000 mg | ORAL_TABLET | Freq: Three times a day (TID) | ORAL | Status: DC
  Administered 2024-03-15 – 2024-03-16 (×3): 25 mg
  Filled 2024-03-15 (×3): qty 1

## 2024-03-15 NOTE — Progress Notes (Signed)
 Subjective: Tmax 101.2 Fahrenheit .  Leukocytosis continues to improve.  Change Foley catheter yesterday  ROS: Unable to obtain due to poor mental status  Examination  Vital signs in last 24 hours: Temp:  [99.5 F (37.5 C)-101.2 F (38.4 C)] 101.2 F (38.4 C) (04/03 0743) Pulse Rate:  [88-118] 111 (04/03 0800) Resp:  [16-27] 19 (04/03 0800) BP: (94-150)/(54-104) 123/71 (04/03 0730) SpO2:  [94 %-100 %] 98 % (04/03 0800) FiO2 (%):  [40 %] 40 % (04/03 0800)  General: lying in bed, intubated Neuro: Open eyes to noxious stimulation, PERRLA, does not track examiner in room, withdraws to noxious stimuli in all extremities except right upper extremity   Basic Metabolic Panel: Recent Labs  Lab 03/09/24 0244 03/09/24 0920 03/10/24 0756 03/11/24 0303 03/12/24 0610 03/13/24 0238 03/14/24 0419 03/15/24 0402  NA 135   < > 135 135 138 140 139 138  K  --    < > 3.9 4.1 4.0 4.7 3.9 4.5  CL  --    < > 104 104 105 106 102 101  CO2  --    < > 23 22 28  20* 27 28  GLUCOSE  --    < > 323* 211* 293* 209* 173* 166*  BUN  --    < > 21* 24* 17 13 14 14   CREATININE  --    < > 1.23* 1.07* 1.03* 0.79 0.76 0.86  CALCIUM  --    < > 8.3* 8.1* 8.4* 8.6* 8.4* 8.5*  MG 2.1  --  2.1 2.2  --   --  2.0  --   PHOS 3.3   < > 3.1 3.1 1.7* 2.7 3.4 3.0   < > = values in this interval not displayed.    CBC: Recent Labs  Lab 03/11/24 0303 03/12/24 0610 03/13/24 0238 03/14/24 0419 03/15/24 0402  WBC 28.5* 19.4* 25.1* 18.3* 15.7*  NEUTROABS 22.2* 15.5* 19.3* 13.9* 11.1*  HGB 10.4* 10.9* 12.6 11.6* 11.6*  HCT 33.9* 34.6* 40.4 37.2 37.4  MCV 88.1 88.0 87.3 86.3 87.0  PLT 174 194 225 249 225     Coagulation Studies: No results for input(s): "LABPROT", "INR" in the last 72 hours.  Imaging No new brain imaging  ASSESSMENT AND PLAN: 58 y.o. 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, concern for focal status epilepticus on 03/07/2024    Convulsive focal status epilepticus, resolved Subclinical seizures, resolved as of 2155 on 03/09/2024 Subdural hematoma Right temporal hemorrhage Acute encephalopathy due to seizures/medications Fever -No clinical seizures overnight.  Sedation stopped on 03/12/2024 around 3 PM.  Mental status gradually improving -Continue  Gabapentin 300mg  every 8 hours, Vimpat 200 mg daily, Keppra 1500 mg twice daily, VPA 500mg  BID  -Management of fever per primary team -Discussed plan with ICU team    I have spent a total of  25 minutes with the patient reviewing hospital notes,  test results, labs and examining the patient as well as establishing an assessment and plan. > 50% of time was spent in direct patient care.   Lindie Spruce Epilepsy [facility] For questions after 5pm please refer to AMION to reach the Neurologist on call
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
## 2024-03-16 ENCOUNTER — Inpatient Hospital Stay (HOSPITAL_COMMUNITY)

## 2024-03-16 DIAGNOSIS — S0634AA Traumatic hemorrhage of right cerebrum with loss of consciousness status unknown, initial encounter: Secondary | ICD-10-CM | POA: Diagnosis not present

## 2024-03-16 DIAGNOSIS — S065XAA Traumatic subdural hemorrhage with loss of consciousness status unknown, initial encounter: Secondary | ICD-10-CM | POA: Diagnosis not present

## 2024-03-16 DIAGNOSIS — G9340 Encephalopathy, unspecified: Secondary | ICD-10-CM | POA: Diagnosis not present

## 2024-03-16 DIAGNOSIS — B9629 Other Escherichia coli [E. coli] as the cause of diseases classified elsewhere: Secondary | ICD-10-CM | POA: Diagnosis not present

## 2024-03-16 DIAGNOSIS — R509 Fever, unspecified: Secondary | ICD-10-CM | POA: Diagnosis not present

## 2024-03-16 DIAGNOSIS — R569 Unspecified convulsions: Secondary | ICD-10-CM | POA: Diagnosis not present

## 2024-03-16 LAB — CULTURE, BLOOD (ROUTINE X 2)
Culture: NO GROWTH
Culture: NO GROWTH

## 2024-03-16 LAB — COMPREHENSIVE METABOLIC PANEL WITH GFR
ALT: 27 U/L (ref 0–44)
AST: 25 U/L (ref 15–41)
Albumin: 2.1 g/dL — ABNORMAL LOW (ref 3.5–5.0)
Alkaline Phosphatase: 66 U/L (ref 38–126)
Anion gap: 9 (ref 5–15)
BUN: 15 mg/dL (ref 6–20)
CO2: 29 mmol/L (ref 22–32)
Calcium: 8.3 mg/dL — ABNORMAL LOW (ref 8.9–10.3)
Chloride: 99 mmol/L (ref 98–111)
Creatinine, Ser: 0.86 mg/dL (ref 0.44–1.00)
GFR, Estimated: >60 mL/min (ref >60)
Glucose, Bld: 164 mg/dL — ABNORMAL HIGH (ref 70–99)
Potassium: 4.2 mmol/L (ref 3.5–5.1)
Sodium: 137 mmol/L (ref 135–145)
Total Bilirubin: 0.4 mg/dL (ref 0.0–1.2)
Total Protein: 7.1 g/dL (ref 6.5–8.1)

## 2024-03-16 LAB — CBC WITH DIFFERENTIAL/PLATELET
Abs Immature Granulocytes: 0.18 10*3/uL — ABNORMAL HIGH (ref 0.00–0.07)
Basophils Absolute: 0 10*3/uL (ref 0.0–0.1)
Basophils Relative: 0 %
Eosinophils Absolute: 0.1 10*3/uL (ref 0.0–0.5)
Eosinophils Relative: 1 %
HCT: 35.8 % — ABNORMAL LOW (ref 36.0–46.0)
Hemoglobin: 11.2 g/dL — ABNORMAL LOW (ref 12.0–15.0)
Immature Granulocytes: 1 %
Lymphocytes Relative: 20 %
Lymphs Abs: 2.7 10*3/uL (ref 0.7–4.0)
MCH: 26.9 pg (ref 26.0–34.0)
MCHC: 31.3 g/dL (ref 30.0–36.0)
MCV: 85.9 fL (ref 80.0–100.0)
Monocytes Absolute: 2.1 10*3/uL — ABNORMAL HIGH (ref 0.1–1.0)
Monocytes Relative: 15 %
Neutro Abs: 8.6 10*3/uL — ABNORMAL HIGH (ref 1.7–7.7)
Neutrophils Relative %: 63 %
Platelets: 252 10*3/uL (ref 150–400)
RBC: 4.17 MIL/uL (ref 3.87–5.11)
RDW: 13.1 % (ref 11.5–15.5)
WBC: 13.7 10*3/uL — ABNORMAL HIGH (ref 4.0–10.5)
nRBC: 0 % (ref 0.0–0.2)

## 2024-03-16 LAB — GLUCOSE, CAPILLARY
Glucose-Capillary: 160 mg/dL — ABNORMAL HIGH (ref 70–99)
Glucose-Capillary: 132 mg/dL — ABNORMAL HIGH (ref 70–99)
Glucose-Capillary: 191 mg/dL — ABNORMAL HIGH (ref 70–99)
Glucose-Capillary: 133 mg/dL — ABNORMAL HIGH (ref 70–99)
Glucose-Capillary: 112 mg/dL — ABNORMAL HIGH (ref 70–99)

## 2024-03-16 MED ORDER — HYDRALAZINE HCL 50 MG PO TABS
50.0000 mg | ORAL_TABLET | Freq: Three times a day (TID) | ORAL | Status: DC
  Administered 2024-03-16 – 2024-03-23 (×20): 50 mg
  Filled 2024-03-16 (×22): qty 1

## 2024-03-16 MED ORDER — GUAIFENESIN 200 MG PO TABS
400.0000 mg | ORAL_TABLET | Freq: Four times a day (QID) | ORAL | Status: DC
  Administered 2024-03-16 – 2024-03-23 (×30): 400 mg
  Filled 2024-03-16 (×34): qty 2

## 2024-03-16 MED ORDER — HYDRALAZINE HCL 25 MG PO TABS
25.0000 mg | ORAL_TABLET | Freq: Once | ORAL | Status: AC
  Administered 2024-03-16: 25 mg
  Filled 2024-03-16: qty 1

## 2024-03-16 NOTE — Progress Notes (Signed)
 Subjective: Tmax 100.24F.  Continues to be more awake but not following commands yet  ROS: Unable to obtain due to poor mental status  Examination  Vital signs in last 24 hours: Temp:  [98.7 F (37.1 C)-100.9 F (38.3 C)] 100.9 F (38.3 C) (04/04 0729) Pulse Rate:  [80-120] 100 (04/04 0530) Resp:  [16-26] 19 (04/04 0530) BP: (95-176)/(60-124) 162/98 (04/04 0835) SpO2:  [92 %-97 %] 96 % (04/04 0820) FiO2 (%):  [40 %] 40 % (04/04 0820) Weight:  [110.7 kg] 110.7 kg (04/04 0500)  General: lying in bed, intubated Neuro: Open eyes to noxious stimulation, PERRLA, briefly tracks examiner in room, does not follow commands, withdraws to noxious stimuli in all extremities except very subtle withdrawal in right upper extremity   Basic Metabolic Panel: Recent Labs  Lab 03/10/24 0756 03/11/24 0303 03/12/24 0610 03/13/24 0238 03/14/24 0419 03/15/24 0402 03/16/24 0337  NA 135 135 138 140 139 138 137  K 3.9 4.1 4.0 4.7 3.9 4.5 4.2  CL 104 104 105 106 102 101 99  CO2 23 22 28  20* 27 28 29   GLUCOSE 323* 211* 293* 209* 173* 166* 164*  BUN 21* 24* 17 13 14 14 15   CREATININE 1.23* 1.07* 1.03* 0.79 0.76 0.86 0.86  CALCIUM 8.3* 8.1* 8.4* 8.6* 8.4* 8.5* 8.3*  MG 2.1 2.2  --   --  2.0  --   --   PHOS 3.1 3.1 1.7* 2.7 3.4 3.0  --     CBC: Recent Labs  Lab 03/12/24 0610 03/13/24 0238 03/14/24 0419 03/15/24 0402 03/16/24 0337  WBC 19.4* 25.1* 18.3* 15.7* 13.7*  NEUTROABS 15.5* 19.3* 13.9* 11.1* 8.6*  HGB 10.9* 12.6 11.6* 11.6* 11.2*  HCT 34.6* 40.4 37.2 37.4 35.8*  MCV 88.0 87.3 86.3 87.0 85.9  PLT 194 225 249 225 252     Coagulation Studies: No results for input(s): "LABPROT", "INR" in the last 72 hours.  Imaging No new brain imaging   ASSESSMENT AND PLAN: 58 y.o. 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, concern for focal status epilepticus on 03/07/2024   Convulsive focal status epilepticus,  resolved Subclinical seizures, resolved as of 2155 on 03/09/2024 Subdural hematoma Right temporal hemorrhage Acute encephalopathy due to seizures/medications Fever -No clinical seizures overnight.  Sedation stopped on 03/12/2024 around 3 PM.  Mental status gradually improving -Continue Gabapentin 300mg  every 8 hours, Vimpat 200 mg daily, Keppra 1500 mg twice daily, VPA 500mg  BID  -Mental status is gradually improving.  Given patient's age and likely poor neurocognitive reserve, patient may need prolonged period of time for recovery.  Will reevaluate on Monday -Management of fever per primary team -Discussed plan with ICU team    I have spent a total of  25 minutes with the patient reviewing hospital notes,  test results, labs and examining the patient as well as establishing an assessment and plan. > 50% of time was spent in direct patient care.  Lindie Spruce Epilepsy [facility] For questions after 5pm please refer to AMION to reach the Neurologist on call

## 2024-03-16 NOTE — Progress Notes (Signed)
 Pharmacy Antibiotic Note  [name] is a 58 y.o. female admitted on 03/02/2024 with acute subdural hematoma due to trauma on Xarelto. Has now developed sepsis and UTI.  Pharmacy has been consulted for Zosyn dosing.  WBC downtrending since starting Zosyn. Fevers to ~ 101F each morning, but improving. End date for 7 days of therapy for complicated UTI is entered. Pharmacy will continue to follow.   Plan: Start Zosyn 3.375g IV q8h (4 hour infusion). Follow cultures, duration of therapy   Height: 5\' 9"  (175.3 cm) Weight: 110.7 kg (244 lb 0.8 oz) IBW/kg (Calculated) : 66.2  Temp (24hrs), Avg:99.8 F (37.7 C), Min:98.7 F (37.1 C), Max:100.9 F (38.3 C)  Recent Labs  Lab 03/12/24 0610 03/13/24 0238 03/13/24 0941 03/13/24 1246 03/14/24 0419 03/15/24 0402 03/16/24 0337  WBC 19.4* 25.1*  --   --  18.3* 15.7* 13.7*  CREATININE 1.03* 0.79  --   --  0.76 0.86 0.86  LATICACIDVEN  --   --  2.0* 1.4  --   --   --     Estimated Creatinine Clearance: 95.7 mL/min (by C-G formula based on SCr of 0.86 mg/dL).    Allergies  Allergen Reactions   Ace Inhibitors Anaphylaxis and Other (See Comments)    Swelling of the tongue and throat    Cleocin [Clindamycin] Anaphylaxis and Swelling   Enalapril Maleate Anaphylaxis and Other (See Comments)    Swelling of the tongue and throat    Zestril [Lisinopril] Anaphylaxis and Other (See Comments)    Swelling of the tongue and throat     Antimicrobials this admission: Ceftriaxone 3/30 >>3/31 Zosyn 4/1 >> (4/8)  Microbiology results: 3/26 MRSA PCR - negative 3/30 BCx - NGTD x5d 3/30 UCx - >100K E.coli ESBL, S-Zosyn, imipenem,   Thank you for allowing pharmacy to be a part of this patient's care.  Burnett Kanaris 03/16/2024 9:48 AM
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
## 2024-03-16 NOTE — Progress Notes (Signed)
 Nutrition Follow-up  DOCUMENTATION CODES:   Obesity unspecified  INTERVENTION:   - Due to concern for sinusitis on CT head from 03/14/24, plan for Cortrak placement Monday, 03/19/24; if pt with additional episodes of emesis prior to Cortrak placement, recommend post-pyloric placement  Continue tube feeding via OG tube: - Glucerna 1.5 @ 55 mL/hr (1320 mL/day)  Tube feeding regimen provides 1980 kcal, 109 grams of protein, and 1002 ml of H2O.   NUTRITION DIAGNOSIS:   Inadequate oral intake related to inability to eat as evidenced by NPO status.  Ongoing, being addressed via enteral nutrition  GOAL:   Patient will meet greater than or equal to 90% of their needs  Met via enteral nutrition at goal  MONITOR:   TF tolerance, Labs, Vent status  REASON FOR ASSESSMENT:   Consult Enteral/tube feeding initiation and management  ASSESSMENT:   Pt with hx of prior CVA, seizures, CHF, HTN, T2DM, OSA on CPAP, and GERD. 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 Admitted from home after progressive headaches and vomiting. 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  3/21 - admitted to ICU for SDH, on cleviprex 3/24 - off cleviprex, transferred out of ICU to IMTS 3/25 - seizure activity 3/26 - re-admitted to MICU, ventilated 3/27 - EEG, lasix for diuresis 4/02 - pt vomited tube feeds, tube feeds held, tube feeds later resumed  Pt with OG tube in stomach per x-ray. Tube feeds infusing at goal rate. Noted pt with an episode of emesis on 03/14/24. Tube feeds resumed later that same day. No additional emesis episodes documented.  Discussed Cortrak placement with PCCM. Given concern for sinusitis on CT head from 03/14/24, plan for Cortrak placement Monday, 03/19/24. Per MD, pt unlikely to be extubated over the weekend. Order for Cortrak on Monday has been placed.  Per nursing edema assessment, pt with mild pitting generalized edema and mild pitting edema to BUE and  BLE.  Admit weight: 111.1 kg Current weight: 110.7 kg  Current Tube Feeds: Glucerna 1.5 @ 55 mL/hr  Patient remains intubated on ventilator support MV: 10.1 L/min Temp (24hrs), Avg:99.8 F (37.7 C), Min:98.7 F (37.1 C), Max:100.9 F (38.3 C)  Medications reviewed and include: fluconazole 200 mg daily, gabapentin, guaifenesin, SSI every 4 hours, novolog 10 units every 4 hours, semglee 30 units daily, lacosamide, levetiracetam, IV protonix 40 mg every 24 hours, miralax 17 grams daily, valproic acid, IV abx  Labs reviewed: WBC 13.7 CBG's: 120-162 x 24 hours  UOP: 1100 mL + 3 unmeasured occurrences x 24 hours I/O's: +6.3 L since admit  Diet Order:   Diet Order             Diet NPO time specified  Diet effective now                   EDUCATION NEEDS:   Not appropriate for education at this time  Skin:  Skin Assessment: Reviewed RN Assessment (MASD perineum)  Last BM:  03/16/24 medium type 7  Height:   Ht Readings from Last 1 Encounters:  03/08/24 5\' 9"  (1.753 m)    Weight:   Wt Readings from Last 1 Encounters:  03/16/24 110.7 kg    Ideal Body Weight:  66 kg  BMI:  Body mass index is 36.04 kg/m.  Estimated Nutritional Needs:   Kcal:  1900-2100  Protein:  95-110g  Fluid:  >2L    Mertie Clause, MS, RD, LDN Registered Dietitian II Please see AMiON for  contact information.

## 2024-03-17 DIAGNOSIS — B370 Candidal stomatitis: Secondary | ICD-10-CM

## 2024-03-17 DIAGNOSIS — A4151 Sepsis due to Escherichia coli [E. coli]: Secondary | ICD-10-CM

## 2024-03-17 DIAGNOSIS — S065XAA Traumatic subdural hemorrhage with loss of consciousness status unknown, initial encounter: Secondary | ICD-10-CM | POA: Diagnosis not present

## 2024-03-17 DIAGNOSIS — R569 Unspecified convulsions: Secondary | ICD-10-CM | POA: Diagnosis not present

## 2024-03-17 LAB — GLUCOSE, CAPILLARY
Glucose-Capillary: 102 mg/dL — ABNORMAL HIGH (ref 70–99)
Glucose-Capillary: 123 mg/dL — ABNORMAL HIGH (ref 70–99)
Glucose-Capillary: 126 mg/dL — ABNORMAL HIGH (ref 70–99)
Glucose-Capillary: 136 mg/dL — ABNORMAL HIGH (ref 70–99)
Glucose-Capillary: 139 mg/dL — ABNORMAL HIGH (ref 70–99)
Glucose-Capillary: 156 mg/dL — ABNORMAL HIGH (ref 70–99)
Glucose-Capillary: 159 mg/dL — ABNORMAL HIGH (ref 70–99)

## 2024-03-17 LAB — COMPREHENSIVE METABOLIC PANEL WITH GFR
ALT: 27 U/L (ref 0–44)
AST: 23 U/L (ref 15–41)
Albumin: 2.1 g/dL — ABNORMAL LOW (ref 3.5–5.0)
Alkaline Phosphatase: 67 U/L (ref 38–126)
Anion gap: 9 (ref 5–15)
BUN: 15 mg/dL (ref 6–20)
CO2: 28 mmol/L (ref 22–32)
Calcium: 8.5 mg/dL — ABNORMAL LOW (ref 8.9–10.3)
Chloride: 96 mmol/L — ABNORMAL LOW (ref 98–111)
Creatinine, Ser: 0.89 mg/dL (ref 0.44–1.00)
GFR, Estimated: >60 mL/min (ref >60)
Glucose, Bld: 182 mg/dL — ABNORMAL HIGH (ref 70–99)
Potassium: 4.1 mmol/L (ref 3.5–5.1)
Sodium: 133 mmol/L — ABNORMAL LOW (ref 135–145)
Total Bilirubin: 0.4 mg/dL (ref 0.0–1.2)
Total Protein: 7.3 g/dL (ref 6.5–8.1)

## 2024-03-17 LAB — CBC WITH DIFFERENTIAL/PLATELET
Abs Immature Granulocytes: 0.18 10*3/uL — ABNORMAL HIGH (ref 0.00–0.07)
Basophils Absolute: 0.1 10*3/uL (ref 0.0–0.1)
Basophils Relative: 0 %
Eosinophils Absolute: 0.2 10*3/uL (ref 0.0–0.5)
Eosinophils Relative: 1 %
HCT: 37 % (ref 36.0–46.0)
Hemoglobin: 11.4 g/dL — ABNORMAL LOW (ref 12.0–15.0)
Immature Granulocytes: 1 %
Lymphocytes Relative: 18 %
Lymphs Abs: 3 10*3/uL (ref 0.7–4.0)
MCH: 26.9 pg (ref 26.0–34.0)
MCHC: 30.8 g/dL (ref 30.0–36.0)
MCV: 87.3 fL (ref 80.0–100.0)
Monocytes Absolute: 2.6 10*3/uL — ABNORMAL HIGH (ref 0.1–1.0)
Monocytes Relative: 15 %
Neutro Abs: 11 10*3/uL — ABNORMAL HIGH (ref 1.7–7.7)
Neutrophils Relative %: 65 %
Platelets: 263 10*3/uL (ref 150–400)
RBC: 4.24 MIL/uL (ref 3.87–5.11)
RDW: 13.1 % (ref 11.5–15.5)
WBC: 17 10*3/uL — ABNORMAL HIGH (ref 4.0–10.5)
nRBC: 0 % (ref 0.0–0.2)

## 2024-03-17 LAB — MRSA NEXT GEN BY PCR, NASAL: MRSA by PCR Next Gen: DETECTED — AB

## 2024-03-17 MED ORDER — VANCOMYCIN HCL 2000 MG/400ML IV SOLN
2000.0000 mg | Freq: Once | INTRAVENOUS | Status: AC
  Administered 2024-03-17: 2000 mg via INTRAVENOUS
  Filled 2024-03-17: qty 400

## 2024-03-17 MED ORDER — FUROSEMIDE 10 MG/ML IJ SOLN
40.0000 mg | Freq: Every day | INTRAMUSCULAR | Status: DC
  Administered 2024-03-17 – 2024-03-18 (×2): 40 mg via INTRAVENOUS
  Filled 2024-03-17 (×2): qty 4

## 2024-03-17 MED ORDER — VANCOMYCIN HCL IN DEXTROSE 1-5 GM/200ML-% IV SOLN
1000.0000 mg | Freq: Two times a day (BID) | INTRAVENOUS | Status: DC
  Administered 2024-03-18 – 2024-03-20 (×5): 1000 mg via INTRAVENOUS
  Filled 2024-03-17 (×5): qty 200
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
## 2024-03-17 NOTE — Progress Notes (Signed)
 Pharmacy Antibiotic Note  [name] is a 58 y.o. female admitted on 03/02/2024 with seizure and SDH.  Patient has been on Zosyn for ESBL E.coli UTI.  Now with Staph aureus growing in tracheal aspiration culture (preliminary result).  Pharmacy has been consulted for vancomycin dosing.  Renal function stable, Tmax 101, WBC WNL.  Plan: Vanc 2gm IV x1, then 1gm IV Q12H for AUC 519 using SCr 0.8  Continue Zosyn EID 3.375gm IV Q8H - might need to broaden Monitor renal fxn, micro data, vanc levels if indicated  Height: 5\' 9"  (175.3 cm) Weight: 115.3 kg (254 lb 3.1 oz) IBW/kg (Calculated) : 66.2  Temp (24hrs), Avg:99.9 F (37.7 C), Min:99.1 F (37.3 C), Max:101 F (38.3 C)  Recent Labs  Lab 03/13/24 0238 03/13/24 0941 03/13/24 1246 03/14/24 0419 03/15/24 0402 03/16/24 0337 03/17/24 1104  WBC 25.1*  --   --  18.3* 15.7* 13.7* 17.0*  CREATININE 0.79  --   --  0.76 0.86 0.86 0.89  LATICACIDVEN  --  2.0* 1.4  --   --   --   --     Estimated Creatinine Clearance: 94.5 mL/min (by C-G formula based on SCr of 0.89 mg/dL).    Allergies  Allergen Reactions   Ace Inhibitors Anaphylaxis and Other (See Comments)    Swelling of the tongue and throat    Cleocin [Clindamycin] Anaphylaxis and Swelling   Enalapril Maleate Anaphylaxis and Other (See Comments)    Swelling of the tongue and throat    Zestril [Lisinopril] Anaphylaxis and Other (See Comments)    Swelling of the tongue and throat     CRO 3/30 >>3/31 Zosyn 4/1 >> 4/8 Fluc 4/2 >> 4/8   3/26 MRSA PCR - positive 3/30 BCx - negative 3/30 UCx - >100K E.coli ESBL (S gent, imipenem, nitrofuran, zosyn) 4/4 TA - Staph aureus, preliminary   [name] D. Laney Potash, PharmD, BCPS, BCCCP 03/17/2024, 1:04 PM

## 2024-03-18 DIAGNOSIS — R569 Unspecified convulsions: Secondary | ICD-10-CM | POA: Diagnosis not present

## 2024-03-18 DIAGNOSIS — S065XAA Traumatic subdural hemorrhage with loss of consciousness status unknown, initial encounter: Secondary | ICD-10-CM | POA: Diagnosis not present

## 2024-03-18 DIAGNOSIS — A4151 Sepsis due to Escherichia coli [E. coli]: Secondary | ICD-10-CM | POA: Diagnosis not present

## 2024-03-18 DIAGNOSIS — B370 Candidal stomatitis: Secondary | ICD-10-CM | POA: Diagnosis not present

## 2024-03-18 LAB — CULTURE, RESPIRATORY W GRAM STAIN

## 2024-03-18 LAB — CBC
HCT: 36.3 % (ref 36.0–46.0)
Hemoglobin: 11.1 g/dL — ABNORMAL LOW (ref 12.0–15.0)
MCH: 26.7 pg (ref 26.0–34.0)
MCHC: 30.6 g/dL (ref 30.0–36.0)
MCV: 87.3 fL (ref 80.0–100.0)
Platelets: 309 10*3/uL (ref 150–400)
RBC: 4.16 MIL/uL (ref 3.87–5.11)
RDW: 12.9 % (ref 11.5–15.5)
WBC: 19.6 10*3/uL — ABNORMAL HIGH (ref 4.0–10.5)
nRBC: 0 % (ref 0.0–0.2)

## 2024-03-18 LAB — GLUCOSE, CAPILLARY
Glucose-Capillary: 166 mg/dL — ABNORMAL HIGH (ref 70–99)
Glucose-Capillary: 146 mg/dL — ABNORMAL HIGH (ref 70–99)
Glucose-Capillary: 153 mg/dL — ABNORMAL HIGH (ref 70–99)
Glucose-Capillary: 172 mg/dL — ABNORMAL HIGH (ref 70–99)
Glucose-Capillary: 164 mg/dL — ABNORMAL HIGH (ref 70–99)
Glucose-Capillary: 188 mg/dL — ABNORMAL HIGH (ref 70–99)

## 2024-03-18 LAB — BASIC METABOLIC PANEL WITH GFR
Anion gap: 11 (ref 5–15)
BUN: 17 mg/dL (ref 6–20)
CO2: 29 mmol/L (ref 22–32)
Calcium: 8.6 mg/dL — ABNORMAL LOW (ref 8.9–10.3)
Chloride: 96 mmol/L — ABNORMAL LOW (ref 98–111)
Creatinine, Ser: 0.94 mg/dL (ref 0.44–1.00)
GFR, Estimated: >60 mL/min (ref >60)
Glucose, Bld: 155 mg/dL — ABNORMAL HIGH (ref 70–99)
Potassium: 4.2 mmol/L (ref 3.5–5.1)
Sodium: 136 mmol/L (ref 135–145)

## 2024-03-18 LAB — PROCALCITONIN: Procalcitonin: <0.1 ng/mL

## 2024-03-18 MED ORDER — FUROSEMIDE 10 MG/ML IJ SOLN
40.0000 mg | Freq: Two times a day (BID) | INTRAMUSCULAR | Status: DC
  Administered 2024-03-18 – 2024-03-20 (×4): 40 mg via INTRAVENOUS
  Filled 2024-03-18 (×4): qty 4
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
## 2024-03-19 DIAGNOSIS — I6200 Nontraumatic subdural hemorrhage, unspecified: Secondary | ICD-10-CM | POA: Diagnosis not present

## 2024-03-19 DIAGNOSIS — J9601 Acute respiratory failure with hypoxia: Secondary | ICD-10-CM | POA: Diagnosis not present

## 2024-03-19 DIAGNOSIS — G40901 Epilepsy, unspecified, not intractable, with status epilepticus: Secondary | ICD-10-CM | POA: Diagnosis not present

## 2024-03-19 DIAGNOSIS — R569 Unspecified convulsions: Secondary | ICD-10-CM | POA: Diagnosis not present

## 2024-03-19 DIAGNOSIS — E119 Type 2 diabetes mellitus without complications: Secondary | ICD-10-CM | POA: Diagnosis not present

## 2024-03-19 DIAGNOSIS — I480 Paroxysmal atrial fibrillation: Secondary | ICD-10-CM | POA: Diagnosis not present

## 2024-03-19 DIAGNOSIS — S065XAA Traumatic subdural hemorrhage with loss of consciousness status unknown, initial encounter: Secondary | ICD-10-CM | POA: Diagnosis not present

## 2024-03-19 DIAGNOSIS — I10 Essential (primary) hypertension: Secondary | ICD-10-CM | POA: Diagnosis not present

## 2024-03-19 DIAGNOSIS — D72829 Elevated white blood cell count, unspecified: Secondary | ICD-10-CM | POA: Diagnosis not present

## 2024-03-19 LAB — BASIC METABOLIC PANEL WITH GFR
Anion gap: 12 (ref 5–15)
BUN: 25 mg/dL — ABNORMAL HIGH (ref 6–20)
CO2: 31 mmol/L (ref 22–32)
Calcium: 8.6 mg/dL — ABNORMAL LOW (ref 8.9–10.3)
Chloride: 92 mmol/L — ABNORMAL LOW (ref 98–111)
Creatinine, Ser: 1.06 mg/dL — ABNORMAL HIGH (ref 0.44–1.00)
GFR, Estimated: >60 mL/min (ref >60)
Glucose, Bld: 105 mg/dL — ABNORMAL HIGH (ref 70–99)
Potassium: 4.1 mmol/L (ref 3.5–5.1)
Sodium: 135 mmol/L (ref 135–145)

## 2024-03-19 LAB — CBC WITH DIFFERENTIAL/PLATELET
Abs Immature Granulocytes: 0.2 10*3/uL — ABNORMAL HIGH (ref 0.00–0.07)
Basophils Absolute: 0.1 10*3/uL (ref 0.0–0.1)
Basophils Relative: 0 %
Eosinophils Absolute: 0.2 10*3/uL (ref 0.0–0.5)
Eosinophils Relative: 1 %
HCT: 34.1 % — ABNORMAL LOW (ref 36.0–46.0)
Hemoglobin: 10.8 g/dL — ABNORMAL LOW (ref 12.0–15.0)
Immature Granulocytes: 1 %
Lymphocytes Relative: 18 %
Lymphs Abs: 3.2 10*3/uL (ref 0.7–4.0)
MCH: 27.8 pg (ref 26.0–34.0)
MCHC: 31.7 g/dL (ref 30.0–36.0)
MCV: 87.9 fL (ref 80.0–100.0)
Monocytes Absolute: 2.7 10*3/uL — ABNORMAL HIGH (ref 0.1–1.0)
Monocytes Relative: 15 %
Neutro Abs: 11.6 10*3/uL — ABNORMAL HIGH (ref 1.7–7.7)
Neutrophils Relative %: 65 %
Platelets: 302 10*3/uL (ref 150–400)
RBC: 3.88 MIL/uL (ref 3.87–5.11)
RDW: 13.1 % (ref 11.5–15.5)
WBC: 18.1 10*3/uL — ABNORMAL HIGH (ref 4.0–10.5)
nRBC: 0 % (ref 0.0–0.2)

## 2024-03-19 LAB — GLUCOSE, CAPILLARY
Glucose-Capillary: 166 mg/dL — ABNORMAL HIGH (ref 70–99)
Glucose-Capillary: 108 mg/dL — ABNORMAL HIGH (ref 70–99)
Glucose-Capillary: 121 mg/dL — ABNORMAL HIGH (ref 70–99)
Glucose-Capillary: 197 mg/dL — ABNORMAL HIGH (ref 70–99)
Glucose-Capillary: 193 mg/dL — ABNORMAL HIGH (ref 70–99)

## 2024-03-19 LAB — VANCOMYCIN, PEAK: Vancomycin Pk: 40 ug/mL (ref 30–40)

## 2024-03-19 MED ORDER — BANATROL TF EN LIQD
60.0000 mL | Freq: Two times a day (BID) | ENTERAL | Status: DC
  Administered 2024-03-19 – 2024-04-02 (×29): 60 mL
  Filled 2024-03-19 (×29): qty 60

## 2024-03-19 MED ORDER — LEVETIRACETAM 500 MG PO TABS
1000.0000 mg | ORAL_TABLET | Freq: Two times a day (BID) | ORAL | Status: DC
  Administered 2024-03-19 – 2024-03-21 (×5): 1000 mg
  Filled 2024-03-19 (×5): qty 2
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
## 2024-03-19 NOTE — Procedures (Signed)
 Cortrak  Person Inserting Tube:  Kendell Bane C, RD Tube Type:  Cortrak - 43 inches Tube Size:  10 Tube Location:  Left nare Initial Placement:  Stomach Secured by: Bridle Technique Used to Measure Tube Placement:  Marking at nare/corner of mouth Cortrak Secured At:  70 cm   Cortrak Tube Team Note:  Consult received to place a Cortrak feeding tube.   No x-ray is required. RN may begin using tube.   If the tube becomes dislodged please keep the tube and contact the Cortrak team at [email or web address] for replacement.  If after hours and replacement cannot be delayed, place a NG tube and confirm placement with an abdominal x-ray.    Cammy Copa., RD, LDN, CNSC See AMiON for contact information

## 2024-03-19 NOTE — Plan of Care (Signed)
  Problem: Nutritional: Goal: Maintenance of adequate nutrition will improve Outcome: Progressing   Problem: Education: Goal: Knowledge of General Education information will improve Description: Including pain rating scale, medication(s)/side effects and non-pharmacologic comfort measures Outcome: Progressing   Problem: Clinical Measurements: Goal: Will remain free from infection Outcome: Progressing Goal: Cardiovascular complication will be avoided Outcome: Progressing   Problem: Pain Managment: Goal: General experience of comfort will improve and/or be controlled Outcome: Progressing

## 2024-03-19 NOTE — Progress Notes (Signed)
   03/19/24 0743  Daily Weaning Assessment  Daily Assessment of Readiness to Wean Wean protocol criteria met (SBT performed)  SBT Method CPAP 5 cm H20 and PS 5 cm H20 (PS 10)  Weaning Start Time [phone]  Patient response Failed SBT terminated  Reason SBT Terminated Apnea   Pt was placed on CPAP/PS 10/5 40% but did not tolerated due to apnea. Even with stimuli Pt unable to keep Mve above 2.0. Pt placed back on full support for now

## 2024-03-19 NOTE — Progress Notes (Signed)
 Nutrition Follow-up  DOCUMENTATION CODES:  Obesity unspecified  INTERVENTION:  Continue tube feeding via cortrak: Glucerna 1.5 at 55 ml/h (1320 ml per day) Provides 1980 kcal, 109 gm protein, 1002 ml free water daily Banatrol BID - provides 45kcal, 5g soluble fiber and 2g protein, and 3.2 mEq of potassium per serving. Discussed bowel regimen with RPH  NUTRITION DIAGNOSIS:  Inadequate oral intake related to inability to eat as evidenced by NPO status. - remains applicable  GOAL:  Patient will meet greater than or equal to 90% of their needs - progressing, TF at goal  MONITOR:  TF tolerance, Labs, Vent status  REASON FOR ASSESSMENT:  Consult Enteral/tube feeding initiation and management  ASSESSMENT:  Pt with hx of prior CVA, seizures, CHF, HTN, T2DM, OSA on CPAP, and GERD. 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 Admitted from home after progressive headaches and vomiting. 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  3/21 admitted to ICU for SDH, on cleviprex 3/24 off cleviprex, transferred out of ICU to IMTS 3/25 seizure activity 3/26 re-admitted to MICU, ventilated 3/27 EEG, lasix for diuresis 4/2 - pt vomited, TF held but later resumed 4/7 - cortrak placed  Pt resting in bed at the time of assessment. No family present this AM. Working on weaning sedation to see if pt can be liberated from vent versus tracheostomy.   OGT replaced for cortrak tube today. Appears to be tolerating TF but RN reports large loose stool overnight. Will add fiber. Noted that pt still have bowel regimen ordered, discussed with RPH.   BUN rise overnight, received lasix.  MV: 9.1 L/min Temp (24hrs), Avg:99.4 F (37.4 C), Min:99.2 F (37.3 C), Max:99.8 F (37.7 C) MAP (cuff):  Admit weight: 111.1 kg  Current weight: 108.9 kg +7.5L this admission   Intake/Output Summary (Last 24 hours) at 03/19/2024 1150 Last data filed at 03/19/2024 1100 Gross per 24 hour   Intake 2105.17 ml  Output 1300 ml  Net 805.17 ml  Net IO Since Admission: 7,645.41 mL [03/19/24 1150]  Drains/Lines: Cortrak (gastric) ETT UOP 1,369mL x 24 hours  Average Meal Intake: 3/22-3/24: 28% intake x 3 recorded meals prior to intubation  Nutritionally Relevant Medications: Scheduled Meds:  atorvastatin  40 mg Per Tube Daily   furosemide  40 mg Intravenous BID   insulin aspart  0-20 Units Subcutaneous Q4H   insulin aspart  10 Units Subcutaneous Q4H   insulin glargine-yfgn  30 Units Subcutaneous Daily   pantoprazole IV  40 mg Intravenous Q24H   polyethylene glycol  17 g Per Tube Daily   Continuous Infusions:  feeding supplement (GLUCERNA 1.5 CAL) 55 mL/hr at 03/19/24 1000   lacosamide (VIMPAT) IV     piperacillin-tazobactam (ZOSYN)  IV Stopped (03/19/24 0932)   vancomycin Stopped (03/19/24 0210)   PRN Meds: senna  Labs Reviewed: Chloride 92 BUN 25, Creatinine 1.06 CBG ranges from 189-307 mg/dL over the last 24 hours HgbA1c 11.3%  NUTRITION - FOCUSED PHYSICAL EXAM: Flowsheet Row Most Recent Value  Orbital Region No depletion  Upper Arm Region No depletion  Thoracic and Lumbar Region No depletion  Buccal Region No depletion  Temple Region Unable to assess  [EEG]  Clavicle Bone Region No depletion  Clavicle and Acromion Bone Region No depletion  Scapular Bone Region Unable to assess  Dorsal Hand Unable to assess  Patellar Region No depletion  Anterior Thigh Region No depletion  Posterior Calf Region No depletion  Edema (RD Assessment) Mild  [  non pitting]  Hair Reviewed  Eyes Unable to assess  Mouth Unable to assess  Skin Reviewed  Nails Unable to assess   Diet Order:   Diet Order             Diet NPO time specified  Diet effective now                   EDUCATION NEEDS:  Not appropriate for education at this time  Skin:  Skin Assessment: Reviewed RN Assessment (MASD perineum)  Last BM:  4/7 - type 7  Height:  Ht Readings from Last 1  Encounters:  03/08/24 5\' 9"  (1.753 m)    Weight:  Wt Readings from Last 1 Encounters:  03/19/24 108.9 kg    Ideal Body Weight:  66 kg  BMI:  Body mass index is 35.45 kg/m.  Estimated Nutritional Needs:  Kcal:  1900-2100 Protein:  95-110g Fluid:  >2L    Greig Castilla, RD, LDN Registered Dietitian II Please reach out via secure chat

## 2024-03-19 NOTE — Progress Notes (Addendum)
 Subjective: No acute events overnight.  Awake but still not following commands.  ROS: Unable to obtain due to poor mental status  Examination  Vital signs in last 24 hours: Temp:  [98.9 F (37.2 C)-99.8 F (37.7 C)] 99.5 F (37.5 C) (04/07 0753) Pulse Rate:  [83-106] 98 (04/07 0820) Resp:  [15-20] 16 (04/07 0820) BP: (108-145)/(65-97) 113/96 (04/07 0820) SpO2:  [98 %-100 %] 99 % (04/07 0820) FiO2 (%):  [40 %] 40 % (04/07 0743) Weight:  [108.9 kg] 108.9 kg (04/07 0346)  General: lying in bed, intubated Neuro: Open eyes to verbal stimulation, did briefly track examiner in room, did not follow commands, withdraws to noxious stimuli in all 4 extremities  Basic Metabolic Panel: Recent Labs  Lab 03/13/24 0238 03/14/24 0419 03/15/24 0402 03/16/24 0337 03/17/24 1104 03/18/24 0240 03/19/24 0803  NA 140 139 138 137 133* 136 135  K 4.7 3.9 4.5 4.2 4.1 4.2 4.1  CL 106 102 101 99 96* 96* 92*  CO2 20* 27 28 29 28 29 31   GLUCOSE 209* 173* 166* 164* 182* 155* 105*  BUN 13 14 14 15 15 17  25*  CREATININE 0.79 0.76 0.86 0.86 0.89 0.94 1.06*  CALCIUM 8.6* 8.4* 8.5* 8.3* 8.5* 8.6* 8.6*  MG  --  2.0  --   --   --   --   --   PHOS 2.7 3.4 3.0  --   --   --   --     CBC: Recent Labs  Lab 03/14/24 0419 03/15/24 0402 03/16/24 0337 03/17/24 1104 03/18/24 0240 03/19/24 0319  WBC 18.3* 15.7* 13.7* 17.0* 19.6* 18.1*  NEUTROABS 13.9* 11.1* 8.6* 11.0*  --  11.6*  HGB 11.6* 11.6* 11.2* 11.4* 11.1* 10.8*  HCT 37.2 37.4 35.8* 37.0 36.3 34.1*  MCV 86.3 87.0 85.9 87.3 87.3 87.9  PLT 249 225 252 263 309 302     Coagulation Studies: No results for input(s): "LABPROT", "INR" in the last 72 hours.  Imaging No new brain imaging overnight   ASSESSMENT AND PLAN:58 y.o. 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, concern for focal status epilepticus on 03/07/2024   Convulsive focal status epilepticus, resolved Subclinical  seizures, resolved as of 2155 on 03/09/2024 Subdural hematoma Right temporal hemorrhage Acute encephalopathy due to seizures/medications -No clinical seizures - Reduce Keppra to 1000 mg twice daily to minimize sedation -Continue Gabapentin 300mg  every 8 hours, Vimpat 200 mg daily,  VPA 500mg  BID  -Mental status very slow to improve.  There is not significant injury in the left hemisphere/speech area therefore not clear why patient is still not following commands.  Could be secondary to poor neurocognitive disorder.  Will continue to monitor -Discussed plan with Dr. Denese Killings via secure chat   I have spent a total of  36 minutes with the patient reviewing hospital notes,  test results, labs and examining the patient as well as establishing an assessment and plan. > 50% of time was spent in direct patient care.    Lindie Spruce Epilepsy [facility] For questions after 5pm please refer to AMION to reach the Neurologist on call

## 2024-03-20 DIAGNOSIS — R69 Illness, unspecified: Secondary | ICD-10-CM | POA: Diagnosis not present

## 2024-03-20 DIAGNOSIS — S065XAA Traumatic subdural hemorrhage with loss of consciousness status unknown, initial encounter: Secondary | ICD-10-CM | POA: Diagnosis not present

## 2024-03-20 DIAGNOSIS — G40901 Epilepsy, unspecified, not intractable, with status epilepticus: Secondary | ICD-10-CM | POA: Diagnosis not present

## 2024-03-20 DIAGNOSIS — Z515 Encounter for palliative care: Secondary | ICD-10-CM | POA: Diagnosis not present

## 2024-03-20 LAB — GLUCOSE, CAPILLARY
Glucose-Capillary: 156 mg/dL — ABNORMAL HIGH (ref 70–99)
Glucose-Capillary: 165 mg/dL — ABNORMAL HIGH (ref 70–99)
Glucose-Capillary: 165 mg/dL — ABNORMAL HIGH (ref 70–99)
Glucose-Capillary: 184 mg/dL — ABNORMAL HIGH (ref 70–99)
Glucose-Capillary: 185 mg/dL — ABNORMAL HIGH (ref 70–99)
Glucose-Capillary: 199 mg/dL — ABNORMAL HIGH (ref 70–99)

## 2024-03-20 LAB — BASIC METABOLIC PANEL WITH GFR
Anion gap: 13 (ref 5–15)
BUN: 26 mg/dL — ABNORMAL HIGH (ref 6–20)
CO2: 32 mmol/L (ref 22–32)
Calcium: 8.5 mg/dL — ABNORMAL LOW (ref 8.9–10.3)
Chloride: 90 mmol/L — ABNORMAL LOW (ref 98–111)
Creatinine, Ser: 1.13 mg/dL — ABNORMAL HIGH (ref 0.44–1.00)
GFR, Estimated: 57 mL/min — ABNORMAL LOW (ref >60)
Glucose, Bld: 170 mg/dL — ABNORMAL HIGH (ref 70–99)
Potassium: 4 mmol/L (ref 3.5–5.1)
Sodium: 135 mmol/L (ref 135–145)

## 2024-03-20 LAB — VANCOMYCIN, TROUGH: Vancomycin Tr: 19 ug/mL (ref 15–20)

## 2024-03-20 MED ORDER — ORAL CARE MOUTH RINSE: 15.0000 mL | OROMUCOSAL | Status: DC | PRN

## 2024-03-20 MED ORDER — RACEPINEPHRINE HCL 2.25 % IN NEBU
INHALATION_SOLUTION | RESPIRATORY_TRACT | Status: AC
  Administered 2024-03-20: 0.5 mL
  Filled 2024-03-20: qty 0.5

## 2024-03-20 MED ORDER — ORAL CARE MOUTH RINSE
15.0000 mL | OROMUCOSAL | Status: DC
  Administered 2024-03-20 – 2024-04-13 (×90): 15 mL via OROMUCOSAL

## 2024-03-20 MED ORDER — VANCOMYCIN HCL 750 MG/150ML IV SOLN
750.0000 mg | Freq: Two times a day (BID) | INTRAVENOUS | Status: AC
  Administered 2024-03-20 – 2024-03-23 (×7): 750 mg via INTRAVENOUS
  Filled 2024-03-20 (×8): qty 150

## 2024-03-20 MED ORDER — SODIUM CHLORIDE 0.9 % IV SOLN
1.0000 g | Freq: Three times a day (TID) | INTRAVENOUS | Status: DC
  Administered 2024-03-20: 1 g via INTRAVENOUS
  Filled 2024-03-20: qty 20
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
## 2024-03-20 NOTE — Progress Notes (Signed)
 Pharmacy Antibiotic Note  [name] is a 58 y.o. female admitted on 03/02/2024 with seizure and SDH.  Patient has been on Zosyn for ESBL E.coli UTI.  Now with Staph aureus growing in tracheal aspiration culture (preliminary result).  Pharmacy has been consulted for vancomycin dosing.  Renal function stable, Tmax 101, WBC WNL.   4/8 AM update:  -Vancomycin AUC is high at 661, will decrease dose -Also has ESBL E Coli UTI and WBC is back up some with a few low grader fevers-discussed with CCM-will change Zosyn>>Merrem  Plan: Dec vancomycin to 750 mg IV q12h >>New estimated AUC: 496 Vancomycin levels as needed DC Zosyn Start Merrem 1g IV q8h  Height: 5\' 9"  (175.3 cm) Weight: 108.9 kg (240 lb 1.3 oz) IBW/kg (Calculated) : 66.2  Temp (24hrs), Avg:99.4 F (37.4 C), Min:98.9 F (37.2 C), Max:100.1 F (37.8 C)  Recent Labs  Lab 03/13/24 0941 03/13/24 1246 03/14/24 0419 03/15/24 0402 03/16/24 0337 03/17/24 1104 03/18/24 0240 03/19/24 0319 03/19/24 0803 03/19/24 1454 03/20/24 0006  WBC  --   --    < > 15.7* 13.7* 17.0* 19.6* 18.1*  --   --   --   CREATININE  --   --    < > 0.86 0.86 0.89 0.94  --  1.06*  --   --   LATICACIDVEN 2.0* 1.4  --   --   --   --   --   --   --   --   --   VANCOTROUGH  --   --   --   --   --   --   --   --   --   --  19  VANCOPEAK  --   --   --   --   --   --   --   --   --  40  --    < > = values in this interval not displayed.    Estimated Creatinine Clearance: 77 mL/min (A) (by C-G formula based on SCr of 1.06 mg/dL (H)).    Allergies  Allergen Reactions   Ace Inhibitors Anaphylaxis and Other (See Comments)    Swelling of the tongue and throat    Cleocin [Clindamycin] Anaphylaxis and Swelling   Enalapril Maleate Anaphylaxis and Other (See Comments)    Swelling of the tongue and throat    Zestril [Lisinopril] Anaphylaxis and Other (See Comments)    Swelling of the tongue and throat    Vanco 4/6>> Merrem 4/8>> CRO 3/30 >>3/31 Zosyn  4/1 >> 4/8 Fluc 4/2 >> 4/9 (planned)   3/26 MRSA PCR - positive 3/30 BCx - negative 3/30 UCx - >100K E.coli ESBL (S gent, imipenem, nitrofuran, zosyn) 4/4 TA - MRSA  Abran Duke, PharmD, BCPS Clinical Pharmacist Phone: [phone]

## 2024-03-20 NOTE — Procedures (Signed)
 Extubation Procedure Note  Patient Details:   Name: [name] DOB: 02-18-1966 MRN: [id number]   Airway Documentation:    Vent end date: 03/20/24 Vent end time: 1027   Evaluation  O2 sats: stable throughout Complications: No apparent complications Patient did tolerate procedure well. Bilateral Breath Sounds: Diminished   Yes Pt was successfully extubated with no apparent complication. Audible cuffleak was heard prior extubation and no signs of stridor are present at this time. Pt was able to state name and protect airway at this point in time. Pt was placed on 4L [place] and is tolerating well at this time. RT will monitor as needed from this point on.   Jolayne Panther 03/20/2024, 10:39 AM
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
## 2024-03-20 NOTE — Progress Notes (Signed)
 eLink Physician-Brief Progress Note Patient Name: [name] DOB: 1966-10-30 MRN: [id number]   Date of Service  03/20/2024  HPI/Events of Note  [name] is a 58 y.o. female admitted on 03/02/2024 with seizure and SDH.  Patient has been on Zosyn for ESBL E.coli UTI.  MRSA tracheal aspirate.  Low-grade fevers and worsening leukocytosis  eICU Interventions  Switch Zosyn to meropenem     Intervention Category Intermediate Interventions: Infection - evaluation and management  Sereena Marando 03/20/2024, 1:19 AM

## 2024-03-20 NOTE — TOC Progression Note (Signed)
 Transition of Care [facility]) - Progression Note    Patient Details  Name: [name] MRN: [id number] Date of Birth: 29-Aug-1966  Transition of Care [facility]) CM/SW Contact  Lawerance Sabal, RN Phone Number: 03/20/2024, 3:17 PM  Clinical Narrative:     Notified by [facility] officer at nurse's station they are leaving. They do not have any more time to wait around for the daughter show up.   Expected Discharge Plan: Home w Home Health Services Barriers to Discharge: Continued Medical Work up  Expected Discharge Plan and Services In-house Referral: Clinical Social Work   Post Acute Care Choice: Home Health Living arrangements for the past 2 months: Apartment                                       Social Determinants of Health (SDOH) Interventions SDOH Screenings   Food Insecurity: No Food Insecurity (03/02/2024)  Housing: High Risk (03/02/2024)  Transportation Needs: Unmet Transportation Needs (03/02/2024)  Utilities: At Risk (03/02/2024)  Alcohol Screen: Low Risk  (03/23/2023)  Depression (PHQ2-9): High Risk (06/02/2023)  Financial Resource Strain: Medium Risk (03/23/2023)  Physical Activity: Insufficiently Active (03/23/2023)  Social Connections: Moderately Isolated (03/02/2024)  Tobacco Use: Medium Risk (03/02/2024)    Readmission Risk Interventions    03/11/2023    3:14 PM  Readmission Risk Prevention Plan  Transportation Screening Complete  PCP or Specialist Appt within 5-7 Days Complete  Home Care Screening Complete  Medication Review (RN CM) Referral to Pharmacy
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
## 2024-03-21 DIAGNOSIS — Z515 Encounter for palliative care: Secondary | ICD-10-CM | POA: Diagnosis not present

## 2024-03-21 DIAGNOSIS — R4182 Altered mental status, unspecified: Secondary | ICD-10-CM

## 2024-03-21 DIAGNOSIS — R69 Illness, unspecified: Secondary | ICD-10-CM | POA: Diagnosis not present

## 2024-03-21 DIAGNOSIS — S0634AA Traumatic hemorrhage of right cerebrum with loss of consciousness status unknown, initial encounter: Secondary | ICD-10-CM | POA: Diagnosis not present

## 2024-03-21 DIAGNOSIS — G40901 Epilepsy, unspecified, not intractable, with status epilepticus: Secondary | ICD-10-CM | POA: Diagnosis not present

## 2024-03-21 DIAGNOSIS — G9340 Encephalopathy, unspecified: Secondary | ICD-10-CM | POA: Diagnosis not present

## 2024-03-21 DIAGNOSIS — S065XAA Traumatic subdural hemorrhage with loss of consciousness status unknown, initial encounter: Secondary | ICD-10-CM | POA: Diagnosis not present

## 2024-03-21 LAB — CBC
HCT: 39.6 % (ref 36.0–46.0)
Hemoglobin: 12.1 g/dL (ref 12.0–15.0)
MCH: 26.6 pg (ref 26.0–34.0)
MCHC: 30.6 g/dL (ref 30.0–36.0)
MCV: 87 fL (ref 80.0–100.0)
Platelets: 323 10*3/uL (ref 150–400)
RBC: 4.55 MIL/uL (ref 3.87–5.11)
RDW: 13.1 % (ref 11.5–15.5)
WBC: 18.8 10*3/uL — ABNORMAL HIGH (ref 4.0–10.5)
nRBC: 0 % (ref 0.0–0.2)

## 2024-03-21 LAB — BASIC METABOLIC PANEL WITH GFR
Anion gap: 11 (ref 5–15)
BUN: 21 mg/dL — ABNORMAL HIGH (ref 6–20)
CO2: 30 mmol/L (ref 22–32)
Calcium: 8.7 mg/dL — ABNORMAL LOW (ref 8.9–10.3)
Chloride: 94 mmol/L — ABNORMAL LOW (ref 98–111)
Creatinine, Ser: 0.8 mg/dL (ref 0.44–1.00)
GFR, Estimated: >60 mL/min (ref >60)
Glucose, Bld: 185 mg/dL — ABNORMAL HIGH (ref 70–99)
Potassium: 4.2 mmol/L (ref 3.5–5.1)
Sodium: 135 mmol/L (ref 135–145)

## 2024-03-21 LAB — GLUCOSE, CAPILLARY
Glucose-Capillary: 160 mg/dL — ABNORMAL HIGH (ref 70–99)
Glucose-Capillary: 172 mg/dL — ABNORMAL HIGH (ref 70–99)
Glucose-Capillary: 186 mg/dL — ABNORMAL HIGH (ref 70–99)
Glucose-Capillary: 192 mg/dL — ABNORMAL HIGH (ref 70–99)
Glucose-Capillary: 192 mg/dL — ABNORMAL HIGH (ref 70–99)
Glucose-Capillary: 227 mg/dL — ABNORMAL HIGH (ref 70–99)
Glucose-Capillary: 237 mg/dL — ABNORMAL HIGH (ref 70–99)

## 2024-03-21 LAB — MAGNESIUM: Magnesium: 2.2 mg/dL (ref 1.7–2.4)

## 2024-03-21 MED ORDER — SPIRONOLACTONE 25 MG PO TABS
25.0000 mg | ORAL_TABLET | Freq: Every day | ORAL | Status: DC
  Administered 2024-03-21 – 2024-03-23 (×3): 25 mg
  Filled 2024-03-21 (×3): qty 1

## 2024-03-21 MED ORDER — ISOSORBIDE DINITRATE 20 MG PO TABS
20.0000 mg | ORAL_TABLET | Freq: Three times a day (TID) | ORAL | Status: DC
  Administered 2024-03-21 – 2024-03-23 (×8): 20 mg
  Filled 2024-03-21 (×11): qty 1

## 2024-03-21 MED ORDER — LEVETIRACETAM 750 MG PO TABS
750.0000 mg | ORAL_TABLET | Freq: Two times a day (BID) | ORAL | Status: DC
  Administered 2024-03-21 – 2024-03-22 (×3): 750 mg
  Filled 2024-03-21 (×4): qty 1
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
## 2024-03-21 NOTE — Plan of Care (Signed)
  Problem: Nutritional: Goal: Maintenance of adequate nutrition will improve Outcome: Progressing   Problem: Skin Integrity: Goal: Risk for impaired skin integrity will decrease Outcome: Progressing   Problem: Tissue Perfusion: Goal: Adequacy of tissue perfusion will improve Outcome: Progressing   Problem: Health Behavior/Discharge Planning: Goal: Ability to manage health-related needs will improve Outcome: Progressing   Problem: Clinical Measurements: Goal: Will remain free from infection Outcome: Progressing   Problem: Activity: Goal: Risk for activity intolerance will decrease Outcome: Progressing
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
## 2024-03-21 NOTE — Progress Notes (Signed)
 Subjective: No acute events overnight.  Extubated yesterday.  Attempting to follow simple commands like sticking out her tongue now.  ROS: unable to obtain due to poor mental status  Examination  Vital signs in last 24 hours: Temp:  [98.3 F (36.8 C)-99.4 F (37.4 C)] 98.9 F (37.2 C) (04/09 1115) Pulse Rate:  [83-109] 109 (04/09 1000) Resp:  [17-26] 19 (04/09 1000) BP: (121-166)/(75-93) 147/85 (04/09 1000) SpO2:  [90 %-98 %] 95 % (04/09 1000) Weight:  [108.2 kg] 108.2 kg (04/09 0412)  General: lying in bed, NAD Neuro: Opens eyes to verbal stimulation, did tell me her name but did not answer the orientation questions, did follow 1 simple command (sticking out her tongue), able to squeeze hands with bilateral upper extremities, subtle withdrawal to noxious stimulation bilateral lower extremities  Basic Metabolic Panel: Recent Labs  Lab 03/15/24 0402 03/16/24 0337 03/17/24 1104 03/18/24 0240 03/19/24 0803 03/20/24 0333 03/21/24 0520  NA 138   < > 133* 136 135 135 135  K 4.5   < > 4.1 4.2 4.1 4.0 4.2  CL 101   < > 96* 96* 92* 90* 94*  CO2 28   < > 28 29 31  32 30  GLUCOSE 166*   < > 182* 155* 105* 170* 185*  BUN 14   < > 15 17 25* 26* 21*  CREATININE 0.86   < > 0.89 0.94 1.06* 1.13* 0.80  CALCIUM 8.5*   < > 8.5* 8.6* 8.6* 8.5* 8.7*  MG  --   --   --   --   --   --  2.2  PHOS 3.0  --   --   --   --   --   --    < > = values in this interval not displayed.    CBC: Recent Labs  Lab 03/15/24 0402 03/16/24 0337 03/17/24 1104 03/18/24 0240 03/19/24 0319 03/21/24 0520  WBC 15.7* 13.7* 17.0* 19.6* 18.1* 18.8*  NEUTROABS 11.1* 8.6* 11.0*  --  11.6*  --   HGB 11.6* 11.2* 11.4* 11.1* 10.8* 12.1  HCT 37.4 35.8* 37.0 36.3 34.1* 39.6  MCV 87.0 85.9 87.3 87.3 87.9 87.0  PLT 225 252 263 309 302 323     Coagulation Studies: No results for input(s): "LABPROT", "INR" in the last 72 hours.  Imaging No new brain imaging overnight  ASSESSMENT AND PLAN:57 y.o. female with  breakthrough seizures in the setting of increased cortical irritation secondary to traumatic SDH, concern for focal status epilepticus on 03/07/2024   Convulsive focal status epilepticus, resolved Subclinical seizures, resolved as of 2155 on 03/09/2024 Subdural hematoma Right temporal hemorrhage Acute encephalopathy due to seizures/medications -No clinical seizures - Reduce Keppra to 750 mg twice daily to minimize sedation -Continue Gabapentin 300mg  every 8 hours, Vimpat 200 mg daily,  VPA 500mg  BID  -Mental status very slow to improve likely secondary to poor neurocognitive disorder.  Will continue to monitor -Discussed plan with Dr. Denese Killings    I have spent a total of  36 minutes with the patient reviewing hospital notes,  test results, labs and examining the patient as well as establishing an assessment and plan. > 50% of time was spent in direct patient care.      Lindie Spruce [facility] For questions after 5pm please refer to AMION to reach the Neurologist on call

## 2024-03-22 DIAGNOSIS — S065XAA Traumatic subdural hemorrhage with loss of consciousness status unknown, initial encounter: Secondary | ICD-10-CM | POA: Diagnosis not present

## 2024-03-22 LAB — BASIC METABOLIC PANEL WITH GFR
Anion gap: 10 (ref 5–15)
BUN: 22 mg/dL — ABNORMAL HIGH (ref 6–20)
CO2: 28 mmol/L (ref 22–32)
Calcium: 8.8 mg/dL — ABNORMAL LOW (ref 8.9–10.3)
Chloride: 97 mmol/L — ABNORMAL LOW (ref 98–111)
Creatinine, Ser: 0.74 mg/dL (ref 0.44–1.00)
GFR, Estimated: >60 mL/min (ref >60)
Glucose, Bld: 181 mg/dL — ABNORMAL HIGH (ref 70–99)
Potassium: 4.3 mmol/L (ref 3.5–5.1)
Sodium: 135 mmol/L (ref 135–145)

## 2024-03-22 LAB — CBC WITH DIFFERENTIAL/PLATELET
Abs Immature Granulocytes: 0.08 10*3/uL — ABNORMAL HIGH (ref 0.00–0.07)
Basophils Absolute: 0.1 10*3/uL (ref 0.0–0.1)
Basophils Relative: 0 %
Eosinophils Absolute: 0.1 10*3/uL (ref 0.0–0.5)
Eosinophils Relative: 1 %
HCT: 36.4 % (ref 36.0–46.0)
Hemoglobin: 11.3 g/dL — ABNORMAL LOW (ref 12.0–15.0)
Immature Granulocytes: 0 %
Lymphocytes Relative: 19 %
Lymphs Abs: 3.4 10*3/uL (ref 0.7–4.0)
MCH: 27 pg (ref 26.0–34.0)
MCHC: 31 g/dL (ref 30.0–36.0)
MCV: 86.9 fL (ref 80.0–100.0)
Monocytes Absolute: 1.8 10*3/uL — ABNORMAL HIGH (ref 0.1–1.0)
Monocytes Relative: 10 %
Neutro Abs: 12.8 10*3/uL — ABNORMAL HIGH (ref 1.7–7.7)
Neutrophils Relative %: 70 %
Platelets: 329 10*3/uL (ref 150–400)
RBC: 4.19 MIL/uL (ref 3.87–5.11)
RDW: 13.1 % (ref 11.5–15.5)
WBC: 18.2 10*3/uL — ABNORMAL HIGH (ref 4.0–10.5)
nRBC: 0 % (ref 0.0–0.2)

## 2024-03-22 LAB — GLUCOSE, CAPILLARY
Glucose-Capillary: 152 mg/dL — ABNORMAL HIGH (ref 70–99)
Glucose-Capillary: 165 mg/dL — ABNORMAL HIGH (ref 70–99)
Glucose-Capillary: 199 mg/dL — ABNORMAL HIGH (ref 70–99)
Glucose-Capillary: 170 mg/dL — ABNORMAL HIGH (ref 70–99)
Glucose-Capillary: 206 mg/dL — ABNORMAL HIGH (ref 70–99)
Glucose-Capillary: 164 mg/dL — ABNORMAL HIGH (ref 70–99)

## 2024-03-22 MED ORDER — MEDIHONEY WOUND/BURN DRESSING EX PSTE
1.0000 | PASTE | Freq: Every day | CUTANEOUS | Status: DC
  Administered 2024-03-22 – 2024-04-03 (×13): 1 via TOPICAL
  Filled 2024-03-22 (×2): qty 44

## 2024-03-22 MED ORDER — METOPROLOL TARTRATE 50 MG PO TABS
75.0000 mg | ORAL_TABLET | Freq: Two times a day (BID) | ORAL | Status: DC
  Administered 2024-03-22 – 2024-03-23 (×3): 75 mg
  Filled 2024-03-22 (×2): qty 1
  Filled 2024-03-22: qty 3

## 2024-03-22 NOTE — Plan of Care (Signed)
  Problem: Fluid Volume: Goal: Ability to maintain a balanced intake and output will improve Outcome: Progressing   Problem: Nutritional: Goal: Maintenance of adequate nutrition will improve Outcome: Progressing   Problem: Tissue Perfusion: Goal: Adequacy of tissue perfusion will improve Outcome: Progressing   Problem: Clinical Measurements: Goal: Diagnostic test results will improve Outcome: Progressing   Problem: Activity: Goal: Risk for activity intolerance will decrease Outcome: Progressing   Problem: Safety: Goal: Ability to remain free from injury will improve Outcome: Progressing

## 2024-03-22 NOTE — Plan of Care (Signed)
 Conts on cotrack w/TF. On 2L [place]. AxOx3, awaiting bed placement.   Problem: Fluid Volume: Goal: Ability to maintain a balanced intake and output will improve Outcome: Not Met (add Reason)   Problem: Skin Integrity: Goal: Risk for impaired skin integrity will decrease Outcome: Not Met (add Reason)   Problem: Education: Goal: Knowledge of General Education information will improve Description: Including pain rating scale, medication(s)/side effects and non-pharmacologic comfort measures Outcome: Not Met (add Reason)   Problem: Clinical Measurements: Goal: Ability to maintain clinical measurements within normal limits will improve Outcome: Not Met (add Reason) Goal: Will remain free from infection Outcome: Not Met (add Reason) Goal: Diagnostic test results will improve Outcome: Not Met (add Reason) Goal: Respiratory complications will improve Outcome: Not Met (add Reason) Goal: Cardiovascular complication will be avoided Outcome: Not Met (add Reason)   Problem: Pain Managment: Goal: General experience of comfort will improve and/or be controlled Outcome: Not Met (add Reason)   Problem: Safety: Goal: Ability to remain free from injury will improve Outcome: Not Met (add Reason)

## 2024-03-22 NOTE — [place] FL2 (Signed)
 [place] MEDICAID FL2 LEVEL OF CARE FORM     IDENTIFICATION  Patient Name: [name] Birthdate: July 26, 1966 Sex: female Admission Date (Current Location): 03/02/2024  [facility] and [place] Number:  Producer, television/film/video and Address:  [facility], [address] [place], [place] [address]      Provider Number: [id number]  Attending Physician Name and Address:  [name], MD  Relative Name and Phone Number:       Current Level of Care:   Recommended Level of Care:   Prior Approval Number:    Date Approved/Denied:   PASRR Number: [id number]  Discharge Plan: SNF    Current Diagnoses: Patient Active Problem List   Diagnosis Date Noted   Status epilepticus (HCC) 03/09/2024   Seizures (HCC) 03/07/2024   Subdural hematoma (HCC) 03/02/2024   Osteomyelitis of fifth toe of right foot (HCC) 05/16/2023   Type 2 diabetes mellitus with hyperglycemia, with long-term current use of insulin (HCC) 03/07/2023   PTSD (post-traumatic stress disorder) 01/20/2023   Anxiety state 01/20/2023   Insomnia due to other mental disorder 01/20/2023   Housing instability 11/18/2022   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   Atrial fibrillation (HCC) 09/13/2019   Vitamin B12 deficiency 09/15/2017   Lumbar radiculopathy 08/03/2017   Constipation 09/09/2015   Localization-related symptomatic epilepsy and epileptic syndromes with complex partial seizures, not intractable, without status epilepticus (HCC) 06/13/2015   Hemiparesis affecting left side as late effect of cerebrovascular accident (HCC) 06/13/2015   Morbid obesity (HCC) 06/13/2015   Intertrigo 11/05/2014   Vitamin D deficiency 10/01/2014   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/23/2013   Mixed stress and urge urinary incontinence 10/11/2013   Health care maintenance 09/13/2013   Chronic combined systolic and diastolic heart failure (HCC) 08/01/2013   Nonischemic cardiomyopathy (HCC)  08/01/2013   LBBB (left bundle branch block) 07/19/2013   Depression 04/09/2013   Primary osteoarthritis of left knee 04/14/2012   Lumbar spondylosis 04/14/2012   Poorly controlled type 2 diabetes mellitus with neuropathy (HCC) 04/14/2012   Hyperlipidemia 11/23/2011   OSA (obstructive sleep apnea) 08/27/2011   Benign essential HTN 08/27/2010    Orientation RESPIRATION BLADDER Height & Weight     Self, Time, Place  O2 (3L oxygen nasal cannula) Incontinent, External catheter Weight: 238 lb 8.6 oz (108.2 kg) Height:  5\' 9"  (175.3 cm)  BEHAVIORAL SYMPTOMS/MOOD NEUROLOGICAL BOWEL NUTRITION STATUS    Convulsions/Seizures (Hx of seizures) Incontinent Diet (Please see dc summary)  AMBULATORY STATUS COMMUNICATION OF NEEDS Skin   Extensive Assist Verbally Other (Comment) (Wound / Incision (Open or Dehisced) 03/14/24 Irritant Dermatitis (Moisture Associated Skin Damage) Perineum Right;Left;Anterior;Posterior MASD incorperating the anus, perineum, and labia)                       Personal Care Assistance Level of Assistance  Bathing, Feeding, Dressing Bathing Assistance: Maximum assistance Feeding assistance: Maximum assistance Dressing Assistance: Maximum assistance     Functional Limitations Info             SPECIAL CARE FACTORS FREQUENCY  PT (By licensed PT), OT (By licensed OT)     PT Frequency: 5x OT Frequency: 5x            Contractures Contractures Info: Not present    Additional Factors Info  Code Status, Allergies, Isolation Precautions, Insulin Sliding Scale Code Status Info: DNR-Pre-Arrest Interventions Desired Allergies Info: Ace Inhibitors; Cleocin (Clindamycin); Enalapril Maleate; Zestril (Lisinopril)   Insulin Sliding Scale Info:  Please see dc summary Isolation Precautions Info: Contact Precautions (ESBL and MRSA)     Current Medications (03/22/2024):  This is the current hospital active medication list Current Facility-Administered Medications   Medication Dose Route Frequency Provider Last Rate Last Admin   acetaminophen (TYLENOL) tablet 650 mg  650 mg Per Tube Q8H PRN Morene Crocker, MD   650 mg at 03/19/24 1841   atorvastatin (LIPITOR) tablet 40 mg  40 mg Per Tube Daily Leslye Peer, MD   40 mg at 03/22/24 4098   Chlorhexidine Gluconate Cloth 2 % PADS 6 each  6 each Topical Daily [name], MD   6 each at 03/22/24 0926   feeding supplement (GLUCERNA 1.5 CAL) liquid 1,000 mL  1,000 mL Per Tube Continuous Martina Sinner, MD 55 mL/hr at 03/22/24 1000 Infusion Verify at 03/22/24 1000   fiber supplement (BANATROL TF) liquid 60 mL  60 mL Per Tube BID [name], MD   60 mL at 03/22/24 0925   gabapentin (NEURONTIN) 250 MG/5ML solution 300 mg  300 mg Per Tube Q8H Agarwala, Daleen Bo, MD   300 mg at 03/22/24 1191   Gerhardt's butt cream   Topical BID Martina Sinner, MD   1 Application at 03/22/24 4782   guaiFENesin tablet 400 mg  400 mg Per Tube Q6H Morene Crocker, MD   400 mg at 03/22/24 9562   hydrALAZINE (APRESOLINE) injection 10 mg  10 mg Intravenous Q4H PRN Carmina Miller, DO   10 mg at 03/21/24 0327   hydrALAZINE (APRESOLINE) tablet 50 mg  50 mg Per Tube Q8H Morene Crocker, MD   50 mg at 03/22/24 0527   insulin aspart (novoLOG) injection 0-20 Units  0-20 Units Subcutaneous Q4H Morene Crocker, MD   4 Units at 03/22/24 1213   insulin aspart (novoLOG) injection 10 Units  10 Units Subcutaneous Q4H Morene Crocker, MD   10 Units at 03/22/24 1214   insulin glargine-yfgn (SEMGLEE) injection 30 Units  30 Units Subcutaneous Daily Morene Crocker, MD   30 Units at 03/22/24 1308   isosorbide dinitrate (ISORDIL) tablet 20 mg  20 mg Per Tube TID [name], MD   20 mg at 03/22/24 6578   lacosamide (VIMPAT) tablet 200 mg  200 mg Per Tube BID Erick Blinks, MD   200 mg at 03/22/24 4696   Or   lacosamide (VIMPAT) 200 mg in sodium chloride 0.9 % 25 mL IVPB  200 mg Intravenous BID  Erick Blinks, MD       leptospermum manuka honey (MEDIHONEY) paste 1 Application  1 Application Topical Daily [name], MD   1 Application at 03/22/24 0927   levETIRAcetam (KEPPRA) tablet 750 mg  750 mg Per Tube BID Charlsie Quest, MD   750 mg at 03/22/24 0925   metoprolol tartrate (LOPRESSOR) tablet 75 mg  75 mg Per Tube BID [name], MD   75 mg at 03/22/24 0931   mupirocin ointment (BACTROBAN) 2 %   Nasal BID Carilyn Goodpasture, MD   1 Application at 03/22/24 2952   Oral care mouth rinse  15 mL Mouth Rinse 4 times per day [name], MD   15 mL at 03/22/24 1214   Oral care mouth rinse  15 mL Mouth Rinse PRN Agarwala, Daleen Bo, MD       pantoprazole (PROTONIX) injection 40 mg  40 mg Intravenous Q24H Leslye Peer, MD   40 mg at 03/22/24 8413   rivaroxaban (XARELTO) tablet 20 mg  20 mg Per  Tube Q supper Knute Neu, RPH   20 mg at 03/21/24 1610   senna (SENOKOT) tablet 8.6 mg  1 tablet Per Tube BID PRN Morene Crocker, MD       spironolactone (ALDACTONE) tablet 25 mg  25 mg Per Tube Daily Agarwala, Daleen Bo, MD   25 mg at 03/22/24 0925   valproic acid (DEPAKENE) 250 MG/5ML solution 500 mg  500 mg Per Tube BID Charlsie Quest, MD   500 mg at 03/22/24 0926   vancomycin (VANCOREADY) IVPB 750 mg/150 mL  750 mg Intravenous Q12H Stevphen Rochester, RPH 150 mL/hr at 03/22/24 1000 Infusion Verify at 03/22/24 1000     Discharge Medications: Please see discharge summary for a list of discharge medications.  Relevant Imaging Results:  Relevant Lab Results:   Additional Information SS# [id number]  Marliss Coots, LCSW

## 2024-03-22 NOTE — Progress Notes (Addendum)
 Took over care today from PCCM. Agree with their current plan and IMTS will be continuing management going forward. Comprehensive note to follow tomorrow.

## 2024-03-22 NOTE — TOC Progression Note (Addendum)
 Transition of Care [facility]) - Progression Note    Patient Details  Name: [name] MRN: [id number] Date of Birth: February 13, 1966  [facility] CM/SW Contact  Marliss Coots, LCSW Phone Number: 03/22/2024, 11:39 AM  Clinical Narrative:     11:39 AM CSW informed APS social worker/temporary legal guardian, Mitzi Davenport (patient currently oriented x3), of physical therapy recommendation of patient discharge to SNF. Mitzi Davenport was agreeable to recommendation and consented CSW to submit referrals to SNFs within [facility]. Mitzi Davenport provided CSW with email ([email or web address]) to provide SNF bed offer with Medicare ratings to.  Expected Discharge Plan: Skilled Nursing Facility Barriers to Discharge: Continued Medical Work up, SNF Pending bed offer  Expected Discharge Plan and Services In-house Referral: Clinical Social Work   Post Acute Care Choice: Skilled Nursing Facility Living arrangements for the past 2 months: Apartment                                       Social Determinants of Health (SDOH) Interventions SDOH Screenings   Food Insecurity: No Food Insecurity (03/02/2024)  Housing: High Risk (03/02/2024)  Transportation Needs: Unmet Transportation Needs (03/02/2024)  Utilities: At Risk (03/02/2024)  Alcohol Screen: Low Risk  (03/23/2023)  Depression (PHQ2-9): High Risk (06/02/2023)  Financial Resource Strain: Medium Risk (03/23/2023)  Physical Activity: Insufficiently Active (03/23/2023)  Social Connections: Moderately Isolated (03/02/2024)  Tobacco Use: Medium Risk (03/02/2024)    Readmission Risk Interventions    03/11/2023    3:14 PM  Readmission Risk Prevention Plan  Transportation Screening Complete  PCP or Specialist Appt within 5-7 Days Complete  Home Care Screening Complete  Medication Review (RN CM) Referral to Pharmacy
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
## 2024-03-22 NOTE — Consult Note (Signed)
 WOC Nurse Consult Note: Reason for Consult: worsening MASD  Wound type: 1. Stage 3 Pressure Injury sacrum/buttocks/R ischium  2. Full thickness L flank  Pressure Injury POA: No  Measurement: see nursing flowsheet  Wound bed: 1. sacrum/buttocks/ischium approximately 50% pink 50% yellow  2. L flank 50% pink 50% dry brown tissue  Drainage (amount, consistency, odor)  see nursing flowsheet  Periwound:  peeling epithelium noted to buttocks  Dressing procedure/placement/frequency: Cleanse sacrum/buttocks/ischium and L flank wounds with vashe wound cleanser Hart Rochester [phone]), do not rinse and allow to air dry.  Apply Medihoney to wound beds daily, cover with dry gauze and silicone foam or ABD pad whichever is preferred.   POC discussed with bedside nurse. Appreciate A. Mikey Bussing, RN assistance with this consult.   WOC team will follow every 7 to 10 days for ongoing assessment of sacrum/buttocks.    Thank you,    Priscella Mann MSN, RN-BC, [employer] [phone]

## 2024-03-23 ENCOUNTER — Encounter (HOSPITAL_COMMUNITY): Payer: Self-pay | Admitting: Pulmonary Disease

## 2024-03-23 ENCOUNTER — Inpatient Hospital Stay (HOSPITAL_COMMUNITY)

## 2024-03-23 DIAGNOSIS — S069XAA Unspecified intracranial injury with loss of consciousness status unknown, initial encounter: Secondary | ICD-10-CM | POA: Diagnosis present

## 2024-03-23 DIAGNOSIS — S065XAA Traumatic subdural hemorrhage with loss of consciousness status unknown, initial encounter: Secondary | ICD-10-CM | POA: Diagnosis not present

## 2024-03-23 DIAGNOSIS — G40901 Epilepsy, unspecified, not intractable, with status epilepticus: Secondary | ICD-10-CM | POA: Diagnosis not present

## 2024-03-23 LAB — GLUCOSE, CAPILLARY
Glucose-Capillary: 157 mg/dL — ABNORMAL HIGH (ref 70–99)
Glucose-Capillary: 159 mg/dL — ABNORMAL HIGH (ref 70–99)
Glucose-Capillary: 192 mg/dL — ABNORMAL HIGH (ref 70–99)
Glucose-Capillary: 215 mg/dL — ABNORMAL HIGH (ref 70–99)
Glucose-Capillary: 198 mg/dL — ABNORMAL HIGH (ref 70–99)

## 2024-03-23 LAB — CBC WITH DIFFERENTIAL/PLATELET
Abs Immature Granulocytes: 0.06 10*3/uL (ref 0.00–0.07)
Basophils Absolute: 0.1 10*3/uL (ref 0.0–0.1)
Basophils Relative: 1 %
Eosinophils Absolute: 0.1 10*3/uL (ref 0.0–0.5)
Eosinophils Relative: 1 %
HCT: 36.2 % (ref 36.0–46.0)
Hemoglobin: 11.1 g/dL — ABNORMAL LOW (ref 12.0–15.0)
Immature Granulocytes: 0 %
Lymphocytes Relative: 19 %
Lymphs Abs: 2.7 10*3/uL (ref 0.7–4.0)
MCH: 26.7 pg (ref 26.0–34.0)
MCHC: 30.7 g/dL (ref 30.0–36.0)
MCV: 87 fL (ref 80.0–100.0)
Monocytes Absolute: 1.5 10*3/uL — ABNORMAL HIGH (ref 0.1–1.0)
Monocytes Relative: 11 %
Neutro Abs: 9.9 10*3/uL — ABNORMAL HIGH (ref 1.7–7.7)
Neutrophils Relative %: 68 %
Platelets: 311 10*3/uL (ref 150–400)
RBC: 4.16 MIL/uL (ref 3.87–5.11)
RDW: 13.2 % (ref 11.5–15.5)
WBC: 14.4 10*3/uL — ABNORMAL HIGH (ref 4.0–10.5)
nRBC: 0 % (ref 0.0–0.2)

## 2024-03-23 LAB — BASIC METABOLIC PANEL WITH GFR
Anion gap: 11 (ref 5–15)
BUN: 24 mg/dL — ABNORMAL HIGH (ref 6–20)
CO2: 27 mmol/L (ref 22–32)
Calcium: 8.6 mg/dL — ABNORMAL LOW (ref 8.9–10.3)
Chloride: 96 mmol/L — ABNORMAL LOW (ref 98–111)
Creatinine, Ser: 0.86 mg/dL (ref 0.44–1.00)
GFR, Estimated: >60 mL/min (ref >60)
Glucose, Bld: 165 mg/dL — ABNORMAL HIGH (ref 70–99)
Potassium: 4.9 mmol/L (ref 3.5–5.1)
Sodium: 134 mmol/L — ABNORMAL LOW (ref 135–145)

## 2024-03-23 MED ORDER — LACOSAMIDE 200 MG PO TABS
200.0000 mg | ORAL_TABLET | Freq: Two times a day (BID) | ORAL | Status: DC
  Administered 2024-03-23 – 2024-04-13 (×42): 200 mg via ORAL
  Filled 2024-03-23 (×42): qty 1

## 2024-03-23 MED ORDER — SENNA 8.6 MG PO TABS
1.0000 | ORAL_TABLET | Freq: Two times a day (BID) | ORAL | Status: DC | PRN
  Administered 2024-04-11 (×2): 8.6 mg via ORAL
  Filled 2024-03-23 (×2): qty 1

## 2024-03-23 MED ORDER — ISOSORBIDE DINITRATE 10 MG PO TABS
20.0000 mg | ORAL_TABLET | Freq: Three times a day (TID) | ORAL | Status: DC
  Administered 2024-03-23 – 2024-04-02 (×29): 20 mg via ORAL
  Filled 2024-03-23: qty 2
  Filled 2024-03-23: qty 1
  Filled 2024-03-23: qty 2
  Filled 2024-03-23: qty 1
  Filled 2024-03-23 (×2): qty 2
  Filled 2024-03-23 (×2): qty 1
  Filled 2024-03-23: qty 2
  Filled 2024-03-23: qty 1
  Filled 2024-03-23 (×2): qty 2
  Filled 2024-03-23: qty 1
  Filled 2024-03-23 (×4): qty 2
  Filled 2024-03-23 (×2): qty 1
  Filled 2024-03-23: qty 2
  Filled 2024-03-23 (×2): qty 1
  Filled 2024-03-23: qty 2
  Filled 2024-03-23: qty 1
  Filled 2024-03-23 (×7): qty 2

## 2024-03-23 MED ORDER — LEVETIRACETAM 500 MG PO TABS
500.0000 mg | ORAL_TABLET | Freq: Two times a day (BID) | ORAL | Status: DC
  Administered 2024-03-23: 500 mg
  Filled 2024-03-23: qty 1

## 2024-03-23 MED ORDER — METOPROLOL TARTRATE 50 MG PO TABS
75.0000 mg | ORAL_TABLET | Freq: Two times a day (BID) | ORAL | Status: DC
  Administered 2024-03-23 – 2024-04-13 (×42): 75 mg via ORAL
  Filled 2024-03-23 (×41): qty 1

## 2024-03-23 MED ORDER — HYDRALAZINE HCL 25 MG PO TABS
25.0000 mg | ORAL_TABLET | Freq: Three times a day (TID) | ORAL | Status: DC
  Administered 2024-03-23 – 2024-04-02 (×29): 25 mg via ORAL
  Filled 2024-03-23 (×29): qty 1

## 2024-03-23 MED ORDER — VALPROIC ACID 250 MG/5ML PO SOLN
500.0000 mg | Freq: Two times a day (BID) | ORAL | Status: DC
  Administered 2024-03-23 – 2024-03-27 (×9): 500 mg via ORAL
  Filled 2024-03-23 (×9): qty 10

## 2024-03-23 MED ORDER — RIVAROXABAN 20 MG PO TABS
20.0000 mg | ORAL_TABLET | Freq: Every day | ORAL | Status: DC
  Administered 2024-03-23 – 2024-04-12 (×21): 20 mg via ORAL
  Filled 2024-03-23 (×21): qty 1

## 2024-03-23 MED ORDER — ATORVASTATIN CALCIUM 40 MG PO TABS
40.0000 mg | ORAL_TABLET | Freq: Every day | ORAL | Status: DC
  Administered 2024-03-24 – 2024-04-13 (×21): 40 mg via ORAL
  Filled 2024-03-23 (×21): qty 1

## 2024-03-23 MED ORDER — GUAIFENESIN 200 MG PO TABS
400.0000 mg | ORAL_TABLET | Freq: Four times a day (QID) | ORAL | Status: DC
  Administered 2024-03-23 – 2024-03-26 (×10): 400 mg via ORAL
  Filled 2024-03-23 (×13): qty 2

## 2024-03-23 MED ORDER — ACETAMINOPHEN 325 MG PO TABS
650.0000 mg | ORAL_TABLET | Freq: Three times a day (TID) | ORAL | Status: DC | PRN
  Administered 2024-03-29: 650 mg via ORAL
  Filled 2024-03-23: qty 2

## 2024-03-23 MED ORDER — LEVETIRACETAM 500 MG PO TABS
500.0000 mg | ORAL_TABLET | Freq: Two times a day (BID) | ORAL | Status: DC
  Administered 2024-03-23 – 2024-04-13 (×42): 500 mg via ORAL
  Filled 2024-03-23 (×43): qty 1

## 2024-03-23 MED ORDER — HYDRALAZINE HCL 25 MG PO TABS
25.0000 mg | ORAL_TABLET | Freq: Three times a day (TID) | ORAL | Status: DC
Start: 2024-03-23 — End: 2024-03-23

## 2024-03-23 MED ORDER — GABAPENTIN 250 MG/5ML PO SOLN
300.0000 mg | Freq: Three times a day (TID) | ORAL | Status: DC
  Administered 2024-03-23 – 2024-03-27 (×11): 300 mg via ORAL
  Filled 2024-03-23 (×13): qty 6

## 2024-03-23 MED ORDER — SPIRONOLACTONE 25 MG PO TABS
25.0000 mg | ORAL_TABLET | Freq: Every day | ORAL | Status: DC
  Administered 2024-03-24 – 2024-04-13 (×21): 25 mg via ORAL
  Filled 2024-03-23 (×21): qty 1

## 2024-03-23 MED ORDER — SODIUM CHLORIDE 0.9 % IV SOLN: 200.0000 mg | Freq: Two times a day (BID) | INTRAVENOUS | Status: DC

## 2024-03-23 NOTE — Progress Notes (Signed)
 Subjective: No acute events overnight.  No new concerns.  ROS: Unable to obtain due to poor mental status  Examination  Vital signs in last 24 hours: Temp:  [97.7 F (36.5 C)-99 F (37.2 C)] 98.4 F (36.9 C) (04/11 0418) Pulse Rate:  [85-108] 95 (04/11 0418) Resp:  [18-22] 18 (04/11 0418) BP: (110-141)/(64-83) 110/64 (04/11 0418) SpO2:  [95 %-100 %] 98 % (04/11 0418) Weight:  [107.9 kg] 107.9 kg (04/11 0500)  General: lying in be, NAD Neuro: Drowsy but opens eyes to verbal stimulation, able to tell me her name, did not answer other orientation questions however did follow simple one-step commands, antigravity strength in bilateral upper extremities with left hemiparesis, withdraws to noxious stimuli in bilateral lower extremities  Basic Metabolic Panel: Recent Labs  Lab 03/18/24 0240 03/19/24 0803 03/20/24 0333 03/21/24 0520 03/22/24 0624  NA 136 135 135 135 135  K 4.2 4.1 4.0 4.2 4.3  CL 96* 92* 90* 94* 97*  CO2 29 31 32 30 28  GLUCOSE 155* 105* 170* 185* 181*  BUN 17 25* 26* 21* 22*  CREATININE 0.94 1.06* 1.13* 0.80 0.74  CALCIUM 8.6* 8.6* 8.5* 8.7* 8.8*  MG  --   --   --  2.2  --     CBC: Recent Labs  Lab 03/17/24 1104 03/18/24 0240 03/19/24 0319 03/21/24 0520 03/22/24 0624  WBC 17.0* 19.6* 18.1* 18.8* 18.2*  NEUTROABS 11.0*  --  11.6*  --  12.8*  HGB 11.4* 11.1* 10.8* 12.1 11.3*  HCT 37.0 36.3 34.1* 39.6 36.4  MCV 87.3 87.3 87.9 87.0 86.9  PLT 263 309 302 323 329     Coagulation Studies: No results for input(s): "LABPROT", "INR" in the last 72 hours.  Imaging No new brain imaging  ASSESSMENT AND PLAN: 58 y.o. 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, concern for focal status epilepticus on 03/07/2024   Convulsive focal status epilepticus, resolved Subclinical seizures, resolved as of 2155 on 03/09/2024 Subdural hematoma Right temporal hemorrhage Acute encephalopathy due to  seizures/medications -No clinical seizures - Reduce Keppra to 500 mg twice daily to minimize sedation -Continue Gabapentin 300mg  every 8 hours, Vimpat 200 mg daily,  VPA 500mg  BID  -Mental status very slow to improve likely secondary to poor neurocognitive disorder.  Will continue to monitor - Neurology will see again on Monday.  In the meantime if you have any questions, please do not hesitate to contact us   I have spent a total of  35 minutes with the patient reviewing hospital notes,  test results, labs and examining the patient as well as establishing an assessment and plan. > 50% of time was spent in direct patient care.        Lindie Spruce Epilepsy [facility] For questions after 5pm please refer to AMION to reach the Neurologist on call
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
## 2024-03-23 NOTE — Progress Notes (Signed)
 Nutrition Follow-up  DOCUMENTATION CODES:  Obesity unspecified  INTERVENTION:  Continue tube feeding via cortrak: Glucerna 1.5 at 55 ml/h (1320 ml per day) Provides 1980 kcal, 109 gm protein, 1002 ml free water daily Banatrol BID - provides 45kcal, 5g soluble fiber and 2g protein, and 3.2 mEq of potassium per serving. Monitor MBS results   NUTRITION DIAGNOSIS:  Inadequate oral intake related to inability to eat as evidenced by NPO status. Remains applicable  GOAL:  Patient will meet greater than or equal to 90% of their needs Met w/ TF at goal  MONITOR:  TF tolerance, Labs, Vent status  REASON FOR ASSESSMENT:  Consult Enteral/tube feeding initiation and management  ASSESSMENT:  Pt with hx of prior CVA, seizures, CHF, HTN, T2DM, OSA on CPAP, and GERD. 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 Admitted from home after progressive headaches and vomiting. 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  3/21 admitted to ICU for SDH, on cleviprex 3/24 off cleviprex, transferred out of ICU to IMTS 3/25 seizure activity 3/26 re-admitted to MICU, ventilated 3/27 EEG, lasix for diuresis 4/2 - pt vomited, TF held but later resumed 4/7 - cortrak placed 4/8- extubated 4/9- SLP recommends continued NPO   Pt being evaluated for SNF placement. SLP plan for MBS tentatively 4/11, will monitor results and adjust diet recommendations if needed. Pt being followed by internal medicine, critical care has signed off.   Checked in with pt who was awake. Pt opened eyes and responded to voice. When asked if pt was having any GI discomforts, she shook her head no. Discussed we will continue to monitor tube feeds and any recommendations made by SLP, pt nodded in understanding.  Tube feeds running at goal, no adjustments need to be made at this time. Continue offering banatrol at this time due to continued multiple loose stools per day.  Admit weight: 111.1 kg  Current weight:  107.9 kg +8.5L this admission  Drains/Lines: Cortrak (gastric): output 60 ml UOP via catheter unmeasured x 2 in last 24 hr  Nutritionally Relevant Medications: Scheduled Meds: Banatrol SSI novolog Novolog 10 units q4h Semglee daily Isordil Protonix Vancomycin   Labs Reviewed: Chloride 96 Sodium 134 BUN 24 CBG ranges from 152-227 mg/dL over the last 24 hours HgbA1c 11.3%  Diet Order:   Diet Order             Diet NPO time specified  Diet effective now                  EDUCATION NEEDS:  Not appropriate for education at this time  Skin:  Skin Assessment: Reviewed RN Assessment (MASD perineum)  Last BM:  4/10 type 6  Height:  Ht Readings from Last 1 Encounters:  03/19/24 5\' 9"  (1.753 m)   Weight:  Wt Readings from Last 1 Encounters:  03/23/24 107.9 kg    Ideal Body Weight:  66 kg  BMI:  Body mass index is 35.13 kg/m.  Estimated Nutritional Needs:  Kcal:  1900-2100 Protein:  95-110g Fluid:  >2L  Louis Meckel Dietetic Intern
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
## 2024-03-23 NOTE — Progress Notes (Addendum)
 Subjective Denies pain  Physical exam Blood pressure 94/68, pulse 86, temperature 98.8 F (37.1 C), temperature source Axillary, resp. rate 18, height 5\' 9"  (1.753 m), weight 108.9 kg, last menstrual period 09/13/2019, SpO2 97%.  Physical Exam: Constitutional: chronically ill-appearing, lying in bed, in no acute distress Cardiovascular: regular rate and rhythm, no m/r/g Pulmonary/Chest: normal work of breathing on room air 2 L , lungs clear to anterior auscultation bilaterally Abdominal: soft, non-tender, non-distended Neurological: alert & oriented Skin: warm and dry   Weight change: -0.3 kg   Intake/Output Summary (Last 24 hours) at 03/23/2024 1257 Last data filed at 03/23/2024 0600 Gross per 24 hour  Intake 557.44 ml  Output 60 ml  Net 497.44 ml   Net IO Since Admission: 8,491.16 mL [03/23/24 1257]  Labs, images, and other studies    Latest Ref Rng & Units 03/23/2024    7:31 AM 03/22/2024    6:24 AM 03/21/2024    5:20 AM  CBC  WBC 4.0 - 10.5 K/uL 14.4  18.2  18.8   Hemoglobin 12.0 - 15.0 g/dL 40.9  81.1  91.4   Hematocrit 36.0 - 46.0 % 36.2  36.4  39.6   Platelets 150 - 400 K/uL 311  329  323        Latest Ref Rng & Units 03/23/2024    7:31 AM 03/22/2024    6:24 AM 03/21/2024    5:20 AM  BMP  Glucose 70 - 99 mg/dL 782  956  213   BUN 6 - 20 mg/dL 24  22  21    Creatinine 0.44 - 1.00 mg/dL 0.86  5.78  4.69   Sodium 135 - 145 mmol/L 134  135  135   Potassium 3.5 - 5.1 mmol/L 4.9  4.3  4.2   Chloride 98 - 111 mmol/L 96  97  94   CO2 22 - 32 mmol/L 27  28  30    Calcium 8.9 - 10.3 mg/dL 8.6  8.8  8.7      Assessment and plan Hospital day 21  [name] is a 58 y.o.with  significant for prior CVA (2015 with residual left hemiplegic, PAF on Xarelto, seizures (2/2 to prior CVA), chronic combined HF, LBBB s/p CRT-D medtronic, PAD, HTN, DMT2, OSA on CPAP, and obesity who was admitted for SDH further complicated by status epilepticus requiring  ICU/intubation, MRSA HCAP, and E. coli. Urosepsis.  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  Seizure disorder further complicated this admission from SDH though thankfully status epilepticus/subclinical seizures have resolved. SLP following and proceeded with modified barium swallow with updated recommendation for Dysphagia II.  Keppra reduced today to 500 mg twice daily in an effort to minimize sedation.  Continue gabapentin 300 mg Q8, Vimpat 200 mg daily, VPA 500 mg twice daily.  MRSA Associated HCAP Afebrile with down-trending leukocytosis. Will complete IV Vancomycin today.  -Trend vitals/CBC  HFrEF (25-30% in 2023) She is currently on Lopressor 75 BID and Aldactone 25 daily.. Not on Entresto with hx of angioedema. Not a good candidate for SGLT2-I given recent ESBL UTI.  HTN BP stable but occasionally on softer side. Will reduce Hydralazine to 25 TID and continue Isordil 20 TID.  HLD Continue home Lipitor  T2DM CBG's stable. Continue Semglee 30 units at bedtime and SSI  ESBL UTI Completed Zosyn course.   Diet: Tube feeds, NPO VTE: SCDs Start:  03/02/24 1229  Code: DNR PT/OT recommendations: SNF   Carmina Miller, DO 03/23/2024, 12:57 PM  Pager: [phone] After 5pm or weekend: [phone]

## 2024-03-23 NOTE — Plan of Care (Signed)
 Conts on TF w/cotrack. APS onboard, ongoing care plan.   Problem: Metabolic: Goal: Ability to maintain appropriate glucose levels will improve 03/23/2024 0804 by Ileana Roup, RN Outcome: Not Met (add Reason) 03/23/2024 0804 by Ileana Roup, RN Outcome: Not Met (add Reason)   Problem: Skin Integrity: Goal: Risk for impaired skin integrity will decrease 03/23/2024 0804 by Ileana Roup, RN Outcome: Not Met (add Reason) 03/23/2024 0804 by Ileana Roup, RN Outcome: Not Met (add Reason)   Problem: Activity: Goal: Risk for activity intolerance will decrease 03/23/2024 0804 by Ileana Roup, RN Outcome: Not Met (add Reason) 03/23/2024 0804 by Ileana Roup, RN Outcome: Not Met (add Reason)   Problem: Pain Managment: Goal: General experience of comfort will improve and/or be controlled 03/23/2024 0804 by Ileana Roup, RN Outcome: Not Met (add Reason) 03/23/2024 0804 by Ileana Roup, RN Outcome: Not Met (add Reason)   Problem: Safety: Goal: Ability to remain free from injury will improve 03/23/2024 0804 by Ileana Roup, RN Outcome: Not Met (add Reason) 03/23/2024 0804 by Ileana Roup, RN Outcome: Not Met (add Reason)

## 2024-03-23 NOTE — Progress Notes (Signed)
 PT Cancellation Note  Patient Details Name: HINA GUPTA MRN: [id number] DOB: 08-13-1966   Cancelled Treatment:    Reason Eval/Treat Not Completed: Patient at procedure or test/unavailable (Pt off the floor at fluro. Will follow up if time allows.)   Gladys Damme 03/23/2024, 2:13 PM

## 2024-03-24 DIAGNOSIS — S065XAA Traumatic subdural hemorrhage with loss of consciousness status unknown, initial encounter: Secondary | ICD-10-CM | POA: Diagnosis not present

## 2024-03-24 LAB — GLUCOSE, CAPILLARY
Glucose-Capillary: 129 mg/dL — ABNORMAL HIGH (ref 70–99)
Glucose-Capillary: 134 mg/dL — ABNORMAL HIGH (ref 70–99)
Glucose-Capillary: 169 mg/dL — ABNORMAL HIGH (ref 70–99)
Glucose-Capillary: 185 mg/dL — ABNORMAL HIGH (ref 70–99)
Glucose-Capillary: 187 mg/dL — ABNORMAL HIGH (ref 70–99)
Glucose-Capillary: 200 mg/dL — ABNORMAL HIGH (ref 70–99)

## 2024-03-24 LAB — CBC
HCT: 33.1 % — ABNORMAL LOW (ref 36.0–46.0)
Hemoglobin: 10.5 g/dL — ABNORMAL LOW (ref 12.0–15.0)
MCH: 27.6 pg (ref 26.0–34.0)
MCHC: 31.7 g/dL (ref 30.0–36.0)
MCV: 86.9 fL (ref 80.0–100.0)
Platelets: 329 10*3/uL (ref 150–400)
RBC: 3.81 MIL/uL — ABNORMAL LOW (ref 3.87–5.11)
RDW: 13.2 % (ref 11.5–15.5)
WBC: 16.8 10*3/uL — ABNORMAL HIGH (ref 4.0–10.5)
nRBC: 0 % (ref 0.0–0.2)

## 2024-03-24 LAB — COMPREHENSIVE METABOLIC PANEL WITH GFR
ALT: 20 U/L (ref 0–44)
AST: 21 U/L (ref 15–41)
Albumin: 2.5 g/dL — ABNORMAL LOW (ref 3.5–5.0)
Alkaline Phosphatase: 77 U/L (ref 38–126)
Anion gap: 10 (ref 5–15)
BUN: 22 mg/dL — ABNORMAL HIGH (ref 6–20)
CO2: 25 mmol/L (ref 22–32)
Calcium: 8.8 mg/dL — ABNORMAL LOW (ref 8.9–10.3)
Chloride: 99 mmol/L (ref 98–111)
Creatinine, Ser: 0.8 mg/dL (ref 0.44–1.00)
GFR, Estimated: >60 mL/min (ref >60)
Glucose, Bld: 145 mg/dL — ABNORMAL HIGH (ref 70–99)
Potassium: 4.5 mmol/L (ref 3.5–5.1)
Sodium: 134 mmol/L — ABNORMAL LOW (ref 135–145)
Total Bilirubin: 0.6 mg/dL (ref 0.0–1.2)
Total Protein: 8 g/dL (ref 6.5–8.1)

## 2024-03-24 MED ORDER — FAMOTIDINE 20 MG PO TABS: 20.0000 mg | ORAL_TABLET | Freq: Two times a day (BID) | ORAL | Status: DC

## 2024-03-24 MED ORDER — FAMOTIDINE 20 MG PO TABS
20.0000 mg | ORAL_TABLET | Freq: Two times a day (BID) | ORAL | Status: DC
  Administered 2024-03-24 – 2024-04-13 (×41): 20 mg via ORAL
  Filled 2024-03-24 (×43): qty 1

## 2024-03-24 NOTE — Progress Notes (Signed)
MD aware that patient does not have IV access.

## 2024-03-24 NOTE — Progress Notes (Signed)
 Subjective Doing well this morning without any new or worsening complaints.  She does want to get the core track removed and we talked about having her eat a good breakfast and possibly removing it today.  Physical exam Blood pressure 121/70, pulse 87, temperature 98.6 F (37 C), temperature source Oral, resp. rate 18, height 5\' 9"  (1.753 m), weight 108.9 kg, last menstrual period 09/13/2019, SpO2 100%.  Physical Exam: Constitutional: chronically ill-appearing, lying in bed, in no acute distress Cardiovascular: regular rate and rhythm Pulmonary/Chest: normal work of breathing Neurological: alert & oriented Skin: warm and dry, bilateral heels without evidence of pressure injury   Weight change: 1 kg   Intake/Output Summary (Last 24 hours) at 03/24/2024 0619 Last data filed at 03/23/2024 1727 Gross per 24 hour  Intake 830 ml  Output 500 ml  Net 330 ml   Net IO Since Admission: 8,821.16 mL [03/24/24 0619]  Labs, images, and other studies    Latest Ref Rng & Units 03/23/2024    7:31 AM 03/22/2024    6:24 AM 03/21/2024    5:20 AM  CBC  WBC 4.0 - 10.5 K/uL 14.4  18.2  18.8   Hemoglobin 12.0 - 15.0 g/dL 16.1  09.6  04.5   Hematocrit 36.0 - 46.0 % 36.2  36.4  39.6   Platelets 150 - 400 K/uL 311  329  323        Latest Ref Rng & Units 03/23/2024    7:31 AM 03/22/2024    6:24 AM 03/21/2024    5:20 AM  BMP  Glucose 70 - 99 mg/dL 409  811  914   BUN 6 - 20 mg/dL 24  22  21    Creatinine 0.44 - 1.00 mg/dL 7.82  9.56  2.13   Sodium 135 - 145 mmol/L 134  135  135   Potassium 3.5 - 5.1 mmol/L 4.9  4.3  4.2   Chloride 98 - 111 mmol/L 96  97  94   CO2 22 - 32 mmol/L 27  28  30    Calcium 8.9 - 10.3 mg/dL 8.6  8.8  8.7      Assessment and plan Hospital day 22  [name] is a 58 y.o.with  significant for prior CVA (2015 with residual left hemiplegic, PAF on Xarelto, seizures (2/2 to prior CVA), chronic combined HF, LBBB s/p CRT-D medtronic, PAD, HTN, DMT2, OSA on  CPAP, and obesity who was admitted for SDH further complicated by status epilepticus requiring ICU/intubation, MRSA HCAP, and E. coli. Urosepsis.  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  Seizure disorder further complicated this admission from SDH though thankfully status epilepticus/subclinical seizures have resolved. SLP following and proceeded with modified barium swallow with updated recommendation for Dysphagia II.  We will see how she eats today and possibly remove the core track. Keppra reduced today to 500 mg twice daily in an effort to minimize sedation.  Continue gabapentin 300 mg Q8, Vimpat 200 mg daily, VPA 500 mg twice daily.  MRSA Associated HCAP Afebrile and no new signs of infection.  Completed 7 days of vancomycin on 4/11 and prior to that completed 7 days of Zosyn.  HFrEF (25-30% in 2023) She is currently on Lopressor 75 BID and Aldactone 25 daily. Not on Entresto with hx of angioedema. Not a good candidate for SGLT2-I given recent ESBL UTI.  HTN Normotensive after reduced  dose of hydralazine to 25 TID and continue Isordil 20 TID.  HLD Continue home Lipitor  T2DM CBG's stable on current regimen of Semglee 30 units daily, 10 units of mealtime insulin and sliding scale.  Over the past couple days she has needed above 80 units of short acting each day.  Needs to be transition to p.o. intake and remove her feeding tube we will balance her basal bolus regimen.  ESBL UTI Completed Zosyn course as above.  Diet: Tube feeds, dysphagia 2 VTE: SCDs Start: 03/02/24 1229  Code: DNR PT/OT recommendations: SNF, patient reports that she would like to go home instead.  Not medically ready for discharge today.  Cleven Dallas, DO Internal Medicine Resident, PGY-2 Please contact the on call pager at [phone] for any urgent or emergent needs. 6:19 AM 03/24/2024

## 2024-03-25 LAB — GLUCOSE, CAPILLARY
Glucose-Capillary: 119 mg/dL — ABNORMAL HIGH (ref 70–99)
Glucose-Capillary: 157 mg/dL — ABNORMAL HIGH (ref 70–99)
Glucose-Capillary: 165 mg/dL — ABNORMAL HIGH (ref 70–99)
Glucose-Capillary: 168 mg/dL — ABNORMAL HIGH (ref 70–99)
Glucose-Capillary: 171 mg/dL — ABNORMAL HIGH (ref 70–99)
Glucose-Capillary: 191 mg/dL — ABNORMAL HIGH (ref 70–99)

## 2024-03-25 NOTE — Progress Notes (Cosign Needed Addendum)
 Subjective Stable this morning. She did have some trouble eating yesterday and felt like she got full faster than usual.   Physical exam Blood pressure 110/66, pulse 88, temperature 98.7 F (37.1 C), temperature source Oral, resp. rate 18, height 5\' 9"  (1.753 m), weight 109 kg, last menstrual period 09/13/2019, SpO2 97%.  Physical Exam: Constitutional: chronically ill-appearing, lying in bed, in no acute distress Cardiovascular: regular rate and rhythm Pulmonary/Chest: normal work of breathing Neurological: alert & oriented Skin: warm and dry, bilateral heels without evidence of pressure injury   Weight change:    Intake/Output Summary (Last 24 hours) at 03/25/2024 1259 Last data filed at 03/25/2024 1000 Gross per 24 hour  Intake 0 ml  Output --  Net 0 ml   Net IO Since Admission: 9,041.16 mL [03/25/24 1259]  Labs, images, and other studies    Latest Ref Rng & Units 03/24/2024    8:38 AM 03/23/2024    7:31 AM 03/22/2024    6:24 AM  CBC  WBC 4.0 - 10.5 K/uL 16.8  14.4  18.2   Hemoglobin 12.0 - 15.0 g/dL 40.9  81.1  91.4   Hematocrit 36.0 - 46.0 % 33.1  36.2  36.4   Platelets 150 - 400 K/uL 329  311  329        Latest Ref Rng & Units 03/24/2024    8:38 AM 03/23/2024    7:31 AM 03/22/2024    6:24 AM  BMP  Glucose 70 - 99 mg/dL 782  956  213   BUN 6 - 20 mg/dL 22  24  22    Creatinine 0.44 - 1.00 mg/dL 0.86  5.78  4.69   Sodium 135 - 145 mmol/L 134  134  135   Potassium 3.5 - 5.1 mmol/L 4.5  4.9  4.3   Chloride 98 - 111 mmol/L 99  96  97   CO2 22 - 32 mmol/L 25  27  28    Calcium 8.9 - 10.3 mg/dL 8.8  8.6  8.8      Assessment and plan Hospital day 23  [name] is a 58 y.o.with  significant for prior CVA (2015 with residual left hemiplegic, PAF on Xarelto, seizures (2/2 to prior CVA), chronic combined HF, LBBB s/p CRT-D medtronic, PAD, HTN, DMT2, OSA on CPAP, and obesity who was admitted for SDH further complicated by status epilepticus requiring  ICU/intubation, MRSA HCAP, and E. coli. Urosepsis.  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  Seizure disorder further complicated this admission from SDH though thankfully status epilepticus/subclinical seizures have resolved. Keppra reduced today to 500 mg twice daily in an effort to minimize sedation.  Continue gabapentin 300 mg Q8, Vimpat 200 mg daily, VPA 500 mg twice daily.  Protein Calorie Malnutrition SLP following and proceeded with modified barium swallow with updated recommendation for Dysphagia II.  She ate about 25% of her meals yesterday but still requires coretrak. - calorie count  MRSA Associated HCAP Afebrile and no new signs of infection.  Completed 7 days of vancomycin on 4/11 and prior to that completed 7 days of Zosyn.  HFrEF (25-30% in 2023) She is currently on hydralazine 25 mg TID, Isordil 20 mg TID, Lopressor 75 BID, and Aldactone 25 daily. Not on Entresto with hx of angioedema. Not a good candidate for SGLT2-I given recent ESBL UTI.  HTN Normotensive after reduced dose of hydralazine  to 25 TID and continue Isordil 20 TID.  HLD Continue home Lipitor  T2DM CBG's stable on current regimen of Semglee 30 units daily, 10 units of mealtime insulin and sliding scale.  Over the past few days she has needed above 80 units of short acting each day.  When she is able to transition to p.o. intake and remove her feeding tube we will balance her basal bolus regimen.  ESBL UTI Completed Zosyn course as above.  Diet: Tube feeds, dysphagia 2 VTE: SCDs Start: 03/02/24 1229  Code: DNR PT/OT recommendations: SNF, patient reports that she would like to go home instead.  Not medically ready for discharge.  Cleven Dallas, DO Internal Medicine Resident, PGY-2 Please contact the on call pager at [phone] for any urgent or emergent needs. 12:59 PM 03/25/2024

## 2024-03-26 ENCOUNTER — Other Ambulatory Visit: Payer: Self-pay | Admitting: Internal Medicine

## 2024-03-26 DIAGNOSIS — G9340 Encephalopathy, unspecified: Secondary | ICD-10-CM | POA: Diagnosis not present

## 2024-03-26 DIAGNOSIS — L89302 Pressure ulcer of unspecified buttock, stage 2: Secondary | ICD-10-CM | POA: Insufficient documentation

## 2024-03-26 DIAGNOSIS — L89152 Pressure ulcer of sacral region, stage 2: Secondary | ICD-10-CM | POA: Insufficient documentation

## 2024-03-26 DIAGNOSIS — I619 Nontraumatic intracerebral hemorrhage, unspecified: Secondary | ICD-10-CM

## 2024-03-26 DIAGNOSIS — E785 Hyperlipidemia, unspecified: Secondary | ICD-10-CM

## 2024-03-26 DIAGNOSIS — G40901 Epilepsy, unspecified, not intractable, with status epilepticus: Secondary | ICD-10-CM | POA: Diagnosis not present

## 2024-03-26 DIAGNOSIS — S065XAA Traumatic subdural hemorrhage with loss of consciousness status unknown, initial encounter: Secondary | ICD-10-CM | POA: Diagnosis not present

## 2024-03-26 LAB — CBC WITH DIFFERENTIAL/PLATELET
Abs Immature Granulocytes: 0.09 10*3/uL — ABNORMAL HIGH (ref 0.00–0.07)
Basophils Absolute: 0.1 10*3/uL (ref 0.0–0.1)
Basophils Relative: 1 %
Eosinophils Absolute: 0.1 10*3/uL (ref 0.0–0.5)
Eosinophils Relative: 1 %
HCT: 36.9 % (ref 36.0–46.0)
Hemoglobin: 11.8 g/dL — ABNORMAL LOW (ref 12.0–15.0)
Immature Granulocytes: 1 %
Lymphocytes Relative: 24 %
Lymphs Abs: 3.8 10*3/uL (ref 0.7–4.0)
MCH: 27.5 pg (ref 26.0–34.0)
MCHC: 32 g/dL (ref 30.0–36.0)
MCV: 86 fL (ref 80.0–100.0)
Monocytes Absolute: 2.1 10*3/uL — ABNORMAL HIGH (ref 0.1–1.0)
Monocytes Relative: 13 %
Neutro Abs: 9.8 10*3/uL — ABNORMAL HIGH (ref 1.7–7.7)
Neutrophils Relative %: 60 %
Platelets: 406 10*3/uL — ABNORMAL HIGH (ref 150–400)
RBC: 4.29 MIL/uL (ref 3.87–5.11)
RDW: 13.7 % (ref 11.5–15.5)
WBC: 16.1 10*3/uL — ABNORMAL HIGH (ref 4.0–10.5)
nRBC: 0 % (ref 0.0–0.2)

## 2024-03-26 LAB — GLUCOSE, CAPILLARY
Glucose-Capillary: 168 mg/dL — ABNORMAL HIGH (ref 70–99)
Glucose-Capillary: 172 mg/dL — ABNORMAL HIGH (ref 70–99)
Glucose-Capillary: 132 mg/dL — ABNORMAL HIGH (ref 70–99)
Glucose-Capillary: 203 mg/dL — ABNORMAL HIGH (ref 70–99)
Glucose-Capillary: 145 mg/dL — ABNORMAL HIGH (ref 70–99)
Glucose-Capillary: 183 mg/dL — ABNORMAL HIGH (ref 70–99)

## 2024-03-26 LAB — COMPREHENSIVE METABOLIC PANEL WITH GFR
ALT: 23 U/L (ref 0–44)
AST: 23 U/L (ref 15–41)
Albumin: 2.7 g/dL — ABNORMAL LOW (ref 3.5–5.0)
Alkaline Phosphatase: 85 U/L (ref 38–126)
Anion gap: 11 (ref 5–15)
BUN: 25 mg/dL — ABNORMAL HIGH (ref 6–20)
CO2: 26 mmol/L (ref 22–32)
Calcium: 8.9 mg/dL (ref 8.9–10.3)
Chloride: 98 mmol/L (ref 98–111)
Creatinine, Ser: 0.95 mg/dL (ref 0.44–1.00)
GFR, Estimated: >60 mL/min (ref >60)
Glucose, Bld: 126 mg/dL — ABNORMAL HIGH (ref 70–99)
Potassium: 4.7 mmol/L (ref 3.5–5.1)
Sodium: 135 mmol/L (ref 135–145)
Total Bilirubin: 0.6 mg/dL (ref 0.0–1.2)
Total Protein: 8.3 g/dL — ABNORMAL HIGH (ref 6.5–8.1)

## 2024-03-26 MED ORDER — GUAIFENESIN 100 MG/5ML PO LIQD
20.0000 mL | Freq: Four times a day (QID) | ORAL | Status: DC
  Administered 2024-03-26 – 2024-04-11 (×52): 20 mL via ORAL
  Filled 2024-03-26 (×57): qty 20

## 2024-03-26 MED ORDER — BIOTENE DRY MOUTH MT LIQD
15.0000 mL | OROMUCOSAL | Status: DC | PRN
  Filled 2024-03-26: qty 15

## 2024-03-26 MED ORDER — JUVEN PO PACK
1.0000 | PACK | Freq: Two times a day (BID) | ORAL | Status: AC
  Administered 2024-03-26 – 2024-04-05 (×19): 1 via ORAL
  Filled 2024-03-26 (×20): qty 1

## 2024-03-26 NOTE — Progress Notes (Signed)
 Subjective: No acute events overnight.  No new concerns.   ROS: negative except above  Examination  Vital signs in last 24 hours: Temp:  [98.5 F (36.9 C)-99 F (37.2 C)] 99 F (37.2 C) (04/14 0817) Pulse Rate:  [88-115] 105 (04/14 0817) Resp:  [18-19] 18 (04/14 0817) BP: (110-140)/(56-86) 111/56 (04/14 0817) SpO2:  [94 %-100 %] 94 % (04/14 0817)  General: lying in bed, NAD Neuro: MS: Alert, oriented to self and place, not to time, follows commands CN: pupils equal and reactive,  EOMI, face symmetric, tongue midline, normal sensation over face, Motor: Antigravity strength in right upper and right lower extremity, withdraws to noxious stimuli in left upper and left lower extremity (baseline left hemiparesis)  Basic Metabolic Panel: Recent Labs  Lab 03/20/24 0333 03/21/24 0520 03/22/24 0624 03/23/24 0731 03/24/24 0838  NA 135 135 135 134* 134*  K 4.0 4.2 4.3 4.9 4.5  CL 90* 94* 97* 96* 99  CO2 32 30 28 27 25   GLUCOSE 170* 185* 181* 165* 145*  BUN 26* 21* 22* 24* 22*  CREATININE 1.13* 0.80 0.74 0.86 0.80  CALCIUM 8.5* 8.7* 8.8* 8.6* 8.8*  MG  --  2.2  --   --   --     CBC: Recent Labs  Lab 03/21/24 0520 03/22/24 0624 03/23/24 0731 03/24/24 0838 03/26/24 0853  WBC 18.8* 18.2* 14.4* 16.8* 16.1*  NEUTROABS  --  12.8* 9.9*  --  9.8*  HGB 12.1 11.3* 11.1* 10.5* 11.8*  HCT 39.6 36.4 36.2 33.1* 36.9  MCV 87.0 86.9 87.0 86.9 86.0  PLT 323 329 311 329 406*     Coagulation Studies: No results for input(s): "LABPROT", "INR" in the last 72 hours.  Imaging No new brain imaging   ASSESSMENT AND PLAN: 58 y.o. 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, concern for focal status epilepticus on 03/07/2024   Convulsive focal status epilepticus, resolved Subclinical seizures, resolved as of 2155 on 03/09/2024 Subdural hematoma Right temporal hemorrhage Acute encephalopathy due to seizures/medications,  resolved - Continue Keppra 500 mg twice daily, Gabapentin 300mg  every 8 hours, Vimpat 200 mg daily,  VPA 500mg  BID  - Continue seizure precautions - Recommend follow-up with neurology in 1 to 2 months - Discussed plan with medicine team via secure chat  I have spent a total of  36 minutes with the patient reviewing hospital notes,  test results, labs and examining the patient as well as establishing an assessment and plan. > 50% of time was spent in direct patient care.   Roxy Cordial Epilepsy [facility] For questions after 5pm please refer to AMION to reach the Neurologist on call
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
## 2024-03-26 NOTE — Plan of Care (Signed)
  Problem: Fluid Volume: Goal: Ability to maintain a balanced intake and output will improve Outcome: Progressing   Problem: Metabolic: Goal: Ability to maintain appropriate glucose levels will improve Outcome: Progressing   Problem: Nutritional: Goal: Maintenance of adequate nutrition will improve Outcome: Progressing   Problem: Skin Integrity: Goal: Risk for impaired skin integrity will decrease Outcome: Progressing   Problem: Tissue Perfusion: Goal: Adequacy of tissue perfusion will improve Outcome: Progressing   Problem: Clinical Measurements: Goal: Ability to maintain clinical measurements within normal limits will improve Outcome: Progressing Goal: Will remain free from infection Outcome: Progressing Goal: Diagnostic test results will improve Outcome: Progressing Goal: Respiratory complications will improve Outcome: Progressing Goal: Cardiovascular complication will be avoided Outcome: Progressing   Problem: Activity: Goal: Risk for activity intolerance will decrease Outcome: Progressing   Problem: Nutrition: Goal: Adequate nutrition will be maintained Outcome: Progressing   Problem: Coping: Goal: Level of anxiety will decrease Outcome: Progressing   Problem: Elimination: Goal: Will not experience complications related to bowel motility Outcome: Progressing Goal: Will not experience complications related to urinary retention Outcome: Progressing   Problem: Safety: Goal: Ability to remain free from injury will improve Outcome: Progressing   Problem: Skin Integrity: Goal: Risk for impaired skin integrity will decrease Outcome: Progressing

## 2024-03-26 NOTE — Progress Notes (Addendum)
 Nutrition Follow-up  DOCUMENTATION CODES:   Obesity unspecified  INTERVENTION:  Please hang calorie count envelope on the patient's door. Document percent consumed for each item on the patient's meal tray ticket and keep in envelope. Also document percent of any supplement or snack pt consumes and keep documentation in envelope for RD to review.   48 calorie count starting today. Will f/u 4/16 to re-assess tube feed recommendations.   Continue tube feeding via cortrak: Glucerna 1.5 at 55 ml/h (1320 ml per day) Provides 1980 kcal, 109 gm protein, 1002 ml free water daily  Add 1 packet Juven BID, each packet provides 95 calories, 2.5 grams of protein (collagen), and 9.8 grams of carbohydrate (3 grams sugar); also contains 7 grams of L-arginine and L-glutamine, 300 mg vitamin C, 15 mg vitamin E, 1.2 mcg vitamin B-12, 9.5 mg zinc, 200 mg calcium, and 1.5 g  Calcium Beta-hydroxy-Beta-methylbutyrate to support wound healing  NUTRITION DIAGNOSIS:   Inadequate oral intake related to inability to eat as evidenced by NPO status. *progressing- diet advanced 4/11  GOAL:   Patient will meet greater than or equal to 90% of their needs *met through TF  MONITOR:   TF tolerance, Labs, Vent status  REASON FOR ASSESSMENT:   Consult Enteral/tube feeding initiation and management  ASSESSMENT:   Pt with hx of prior CVA, seizures, CHF, HTN, T2DM, OSA on CPAP, and GERD. 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 Admitted from home after progressive headaches and vomiting. 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  SLP recommended Dys 2 diet on 4/11. RD saw SLP in hallway and reported pt was very lethargic this afternoon and was only able to do trail of 1 bite of diet and 1 sip of liquid. Pt is eating very little on trays. Glucerna still running at 55 mL/hr. New stage 3 PI noted.   Calorie count envelope placed on door of pt's room.   Diet Order:   Diet Order              DIET DYS 2 Room service appropriate? Yes with Assist; Fluid consistency: Honey Thick  Diet effective now                   EDUCATION NEEDS:   Not appropriate for education at this time  Skin:  Skin Assessment: Skin Integrity Issues: (new 4/10) Skin Integrity Issues:: Stage III Stage III: Ischial tuberosity  Last BM:  4/14  Height:   Ht Readings from Last 1 Encounters:  03/19/24 5\' 9"  (1.753 m)    Weight:   Wt Readings from Last 1 Encounters:  03/24/24 109 kg    Ideal Body Weight:  66 kg  BMI:  Body mass index is 35.49 kg/m.  Estimated Nutritional Needs:   Kcal:  1900-2100  Protein:  95-110g  Fluid:  >2L  Laren Player, MPH, RD, LDN Clinical Dietitian Contact information can be found at [facility].
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
## 2024-03-26 NOTE — Consult Note (Addendum)
 WOC Nurse wound follow up Refer to previous WOC progress notes.   Pt has a Stage 3 pressure injury to right buttocks/ischium. Approx 1.5X1.5X.2cm, 50% red, 50% yellow.  Present on admission: No Bilat buttocks with patchy areas of partial thickness skin loss related to moisture associated skin damage, red and moist.  Full thickness wound to left upper outer thigh is pink dry scar tissue.  Pt is frequently incontinent of stool and it is being trapped underneath the foam dressing, leave foam dressing off and apply Gerhardts cream to protect and repel moisture.  Continue present plan of care with [facility] to assist with removal of nonviable tissue. WOC team will reassess weekly to determine if a change in the plan of care is indicated at that time.  Thank-you,  Wiliam Harder MSN, RN, CWOCN, [place], CNS [phone]

## 2024-03-26 NOTE — Plan of Care (Signed)
   Medical records reviewed including progress notes, labs, imaging.  Patient continues to be lethargic and unable to participate in detailed GOC discussion.  She has temporary legal guardian through APS.  PMT will continue to follow peripherally for opportunity to discuss goals of care and support holistically.   Thank you for your referral and allowing PMT to assist in [name]'s care.   Myli Pae, PA-C Palliative Medicine Team  Team Phone # [phone]   NO CHARGE

## 2024-03-27 ENCOUNTER — Inpatient Hospital Stay (HOSPITAL_COMMUNITY)

## 2024-03-27 DIAGNOSIS — Z8673 Personal history of transient ischemic attack (TIA), and cerebral infarction without residual deficits: Secondary | ICD-10-CM

## 2024-03-27 DIAGNOSIS — K143 Hypertrophy of tongue papillae: Secondary | ICD-10-CM | POA: Diagnosis not present

## 2024-03-27 DIAGNOSIS — Z8614 Personal history of Methicillin resistant Staphylococcus aureus infection: Secondary | ICD-10-CM

## 2024-03-27 DIAGNOSIS — E46 Unspecified protein-calorie malnutrition: Secondary | ICD-10-CM | POA: Diagnosis not present

## 2024-03-27 DIAGNOSIS — G43909 Migraine, unspecified, not intractable, without status migrainosus: Secondary | ICD-10-CM | POA: Diagnosis not present

## 2024-03-27 DIAGNOSIS — E785 Hyperlipidemia, unspecified: Secondary | ICD-10-CM

## 2024-03-27 DIAGNOSIS — L89152 Pressure ulcer of sacral region, stage 2: Secondary | ICD-10-CM | POA: Diagnosis not present

## 2024-03-27 DIAGNOSIS — Z8619 Personal history of other infectious and parasitic diseases: Secondary | ICD-10-CM

## 2024-03-27 DIAGNOSIS — I110 Hypertensive heart disease with heart failure: Secondary | ICD-10-CM

## 2024-03-27 LAB — COMPREHENSIVE METABOLIC PANEL WITH GFR
ALT: 20 U/L (ref 0–44)
AST: 22 U/L (ref 15–41)
Albumin: 2.7 g/dL — ABNORMAL LOW (ref 3.5–5.0)
Alkaline Phosphatase: 82 U/L (ref 38–126)
Anion gap: 13 (ref 5–15)
BUN: 30 mg/dL — ABNORMAL HIGH (ref 6–20)
CO2: 22 mmol/L (ref 22–32)
Calcium: 9.1 mg/dL (ref 8.9–10.3)
Chloride: 99 mmol/L (ref 98–111)
Creatinine, Ser: 1.07 mg/dL — ABNORMAL HIGH (ref 0.44–1.00)
GFR, Estimated: >60 mL/min (ref >60)
Glucose, Bld: 119 mg/dL — ABNORMAL HIGH (ref 70–99)
Potassium: 4.1 mmol/L (ref 3.5–5.1)
Sodium: 134 mmol/L — ABNORMAL LOW (ref 135–145)
Total Bilirubin: 0.5 mg/dL (ref 0.0–1.2)
Total Protein: 8.6 g/dL — ABNORMAL HIGH (ref 6.5–8.1)

## 2024-03-27 LAB — CBC
HCT: 36.9 % (ref 36.0–46.0)
Hemoglobin: 11.7 g/dL — ABNORMAL LOW (ref 12.0–15.0)
MCH: 27 pg (ref 26.0–34.0)
MCHC: 31.7 g/dL (ref 30.0–36.0)
MCV: 85.2 fL (ref 80.0–100.0)
Platelets: 387 10*3/uL (ref 150–400)
RBC: 4.33 MIL/uL (ref 3.87–5.11)
RDW: 13.9 % (ref 11.5–15.5)
WBC: 15.1 10*3/uL — ABNORMAL HIGH (ref 4.0–10.5)
nRBC: 0 % (ref 0.0–0.2)

## 2024-03-27 LAB — GLUCOSE, CAPILLARY
Glucose-Capillary: 122 mg/dL — ABNORMAL HIGH (ref 70–99)
Glucose-Capillary: 151 mg/dL — ABNORMAL HIGH (ref 70–99)
Glucose-Capillary: 163 mg/dL — ABNORMAL HIGH (ref 70–99)
Glucose-Capillary: 172 mg/dL — ABNORMAL HIGH (ref 70–99)
Glucose-Capillary: 181 mg/dL — ABNORMAL HIGH (ref 70–99)
Glucose-Capillary: 196 mg/dL — ABNORMAL HIGH (ref 70–99)

## 2024-03-27 LAB — AMMONIA: Ammonia: 46 umol/L — ABNORMAL HIGH (ref 9–35)

## 2024-03-27 LAB — VALPROIC ACID LEVEL: Valproic Acid Lvl: 39 ug/mL — ABNORMAL LOW (ref 50.0–100.0)

## 2024-03-27 MED ORDER — METFORMIN HCL ER 500 MG PO TB24: 500.0000 mg | ORAL_TABLET | Freq: Every day | ORAL | Status: DC

## 2024-03-27 MED ORDER — SODIUM CHLORIDE 0.9 % IV BOLUS
500.0000 mL | Freq: Once | INTRAVENOUS | Status: AC
  Administered 2024-03-27: 500 mL via INTRAVENOUS

## 2024-03-27 MED ORDER — GABAPENTIN 250 MG/5ML PO SOLN
300.0000 mg | Freq: Two times a day (BID) | ORAL | Status: DC
  Administered 2024-03-27: 300 mg via ORAL
  Filled 2024-03-27 (×2): qty 6

## 2024-03-27 MED ORDER — MIRTAZAPINE 15 MG PO TBDP
7.5000 mg | ORAL_TABLET | Freq: Every day | ORAL | Status: DC
  Administered 2024-03-27: 7.5 mg via ORAL
  Filled 2024-03-27 (×2): qty 0.5

## 2024-03-27 NOTE — Plan of Care (Signed)
  Problem: Nutritional: Goal: Maintenance of adequate nutrition will improve Outcome: Progressing Goal: Progress toward achieving an optimal weight will improve Outcome: Progressing   Problem: Skin Integrity: Goal: Risk for impaired skin integrity will decrease Outcome: Progressing   Problem: Tissue Perfusion: Goal: Adequacy of tissue perfusion will improve Outcome: Progressing   Problem: Clinical Measurements: Goal: Ability to maintain clinical measurements within normal limits will improve Outcome: Progressing Goal: Will remain free from infection Outcome: Progressing Goal: Diagnostic test results will improve Outcome: Progressing Goal: Respiratory complications will improve Outcome: Progressing Goal: Cardiovascular complication will be avoided Outcome: Progressing   Problem: Elimination: Goal: Will not experience complications related to bowel motility Outcome: Progressing Goal: Will not experience complications related to urinary retention Outcome: Progressing   Problem: Safety: Goal: Ability to remain free from injury will improve Outcome: Progressing

## 2024-03-27 NOTE — TOC Progression Note (Addendum)
 Transition of Care [facility]) - Progression Note    Patient Details  Name: [name] MRN: [id number] Date of Birth: 05/08/1966  [facility] of [facility]) CM/SW Contact  Tandy Fam, [place] Phone Number: 03/27/2024, 12:05 PM  Clinical Narrative:   CSW following for disposition. Patient not medically ready, continues with cortrak, goals of care discussions ongoing with palliative care. CSW to follow.  UPDATE: CSW met with patient's temporary legal guardian through APS, Shelley Dew, to provide brief update and answer questions. RN also answered questions to provide brief medical update, also informed that [employer] came to meet with patient earlier today. [employer]PS to discuss with [employer] about findings from today. CSW to follow.   Expected Discharge Plan: Skilled Nursing Facility Barriers to Discharge: Continued Medical Work up, SNF Pending bed offer  Expected Discharge Plan and Services In-house Referral: Clinical Social Work   Post Acute Care Choice: Skilled Nursing Facility Living arrangements for the past 2 months: Apartment                                       Social Determinants of Health (SDOH) Interventions SDOH Screenings   Food Insecurity: No Food Insecurity (03/02/2024)  Housing: High Risk (03/02/2024)  Transportation Needs: Unmet Transportation Needs (03/02/2024)  Utilities: At Risk (03/02/2024)  Alcohol Screen: Low Risk  (03/23/2023)  Depression (PHQ2-9): High Risk (06/02/2023)  Financial Resource Strain: Medium Risk (03/23/2023)  Physical Activity: Insufficiently Active (03/23/2023)  Social Connections: Moderately Isolated (03/02/2024)  Tobacco Use: Medium Risk (03/02/2024)    Readmission Risk Interventions    03/11/2023    3:14 PM  Readmission Risk Prevention Plan  Transportation Screening Complete  PCP or Specialist Appt within 5-7 Days Complete  Home Care Screening Complete  Medication Review (RN CM) Referral to Pharmacy

## 2024-03-27 NOTE — Telephone Encounter (Signed)
 Medication sent to pharmacy

## 2024-03-27 NOTE — Progress Notes (Signed)
 PIV consult: Arrived to room, pt off unit. RN to re-enter consult when pt ready for IV start.

## 2024-03-27 NOTE — Progress Notes (Signed)
 Calorie Count Note  48 hour calorie count ordered by provider  Visited room to collect meal tickets, pt noted to be sleeping. 1 ticket in envelope to collect which is 0% consumed (dinner 4/14). Breakfast and lunch trays at bedside untouched. RN at bedside states that pt has not eaten anything for her at all today. At this time, kcal count not beneficial as pt is not interested in taking in PO. Will d/c for now until pt is routinely taking in some PO feeds at her meals.  Diet: DYS 2, honey thick liquids TF: Glucerna 1.5 @ 50mL/h   4/14 Dinner: 0% 4/15 Breakfast: 0% 4/15 Lunch: 0%  Total intake: 0 kcal (0% of minimum estimated needs)  0 protein (0% of minimum estimated needs)  NUTRITION DIAGNOSIS:  Inadequate oral intake related to inability to eat as evidenced by NPO status. Remains applicabl  GOAL:  Patient will meet greater than or equal to 90% of their needs Met w/ TF at goa  INTERVENTION:  Continue tube feeding via cortrak: Glucerna 1.5 at 55 ml/h (1320 ml per day) Provides 1980 kcal, 109 gm protein, 1002 ml free water daily Banatrol BID - provides 45kcal, 5g soluble fiber and 2g protein, and 3.2 mEq of potassium per serving. Discontinue kcal count until pt is consistently eating some PO items    Edwena Graham, RD, LDN Registered Dietitian II Please reach out via secure chat

## 2024-03-27 NOTE — Progress Notes (Cosign Needed Addendum)
 Subjective  Has moments of alertness but then decreased consciousness.  When awake she has been talking she states she is okay and is alert and oriented x 3.    Vitals:   03/27/24 0403 03/27/24 0829  BP: 109/75 127/86  Pulse: (!) 105 (!) 108  Resp: 20 20  Temp: 98.6 F (37 C) 98.8 F (37.1 C)  SpO2: 98% 97%     Physical Exam: Constitutional: chronically ill-appearing, lying in bed, in no acute distress Cardiovascular: regular rate and rhythm Pulmonary/Chest: normal work of breathing Abdominal: Nontender to palpation, no suprapubic tenderness Neurological: alert to self and location not to time Skin: warm and dry, bilateral heels without evidence of pressure injury  Weight change:   Intake/Output Summary (Last 24 hours) at 03/27/2024 1235 Last data filed at 03/27/2024 1024 Gross per 24 hour  Intake 180 ml  Output 1300 ml  Net -1120 ml   Net IO Since Admission: 6,741.16 mL [03/27/24 1235]  Labs, images, and other studies    Latest Ref Rng & Units 03/27/2024    6:01 AM 03/26/2024    8:53 AM 03/24/2024    8:38 AM  CBC  WBC 4.0 - 10.5 K/uL 15.1  16.1  16.8   Hemoglobin 12.0 - 15.0 g/dL 32.4  40.1  02.7   Hematocrit 36.0 - 46.0 % 36.9  36.9  33.1   Platelets 150 - 400 K/uL 387  406  329        Latest Ref Rng & Units 03/27/2024    6:01 AM 03/26/2024    8:53 AM 03/24/2024    8:38 AM  BMP  Glucose 70 - 99 mg/dL 253  664  403   BUN 6 - 20 mg/dL 30  25  22    Creatinine 0.44 - 1.00 mg/dL 4.74  2.59  5.63   Sodium 135 - 145 mmol/L 134  135  134   Potassium 3.5 - 5.1 mmol/L 4.1  4.7  4.5   Chloride 98 - 111 mmol/L 99  98  99   CO2 22 - 32 mmol/L 22  26  25    Calcium 8.9 - 10.3 mg/dL 9.1  8.9  8.8      Assessment and plan Hospital day 25  [name] is a 58 y.o.with  significant for 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 coli. Urosepsis.   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  She got teary-eyed when I asked her to move her right leg but she was unable to.  Given her worsening decline we will repeat CT head, I have also reached out to neurology, and appreciate the recommendations.  We will measure a valproate level as well as ammonia and decrease gabapentin to twice daily dosing.  They will put her back on EEG as well.  Protein Calorie Malnutrition Ate 0% of her food yesterday she has a core track.  Has been having fluctuating consciousness that has been slowly worsening over the past day.  This is  likely affecting her feeding.  She will continue on the core track, we will give 500 cc of fluids today creatinine is 1.1 and she is not -1.1 L for the last shift.  -cw calorie count -cw coretrak -continue to encourage feeding. -Start mirtazapine 7.5mg  qhs  Coated tongue  White plaque on tongue but is not easily scrapable.  She does have some whitening on the buccal mucosa but these are more consistent with the dryness that she has in her mouth versus actual Candida with scrappable plaques on an erythematous base. -Continue with good oral hygiene.  Pressure ulcers Stage II on sacrum and on linear crease of the buttocks on the left.   Cw wound care (manuka honey, butt cream, mepilex, and frequent repositioning every 2 hours while awake).   HFrEF (25-30% in 2023) She is currently on hydralazine 25 mg TID, Isordil 20 mg TID, Lopressor 75 BID, and Aldactone 25 daily. Not on Entresto with hx of angioedema. Not a good candidate for SGLT2-I given recent ESBL UTI.  She is euvolemic at this time with no  increased oxygen requirements.  HTN Normotensive after reduced dose of hydralazine to 25 TID and continue Isordil 20 TID.  HLD Continue home Lipitor  T2DM CBG's stable on current regimen of Semglee 30 units daily, 10 units of mealtime insulin and sliding scale.  She does not have any active infection and has working renal function I do think that we can consider starting metformin in the future if hepatic function is normal. -Continue with Semglee 30 units daily and 10 units of mealtime insulin on sliding scale.  MRSA Associated HCAP- resolved Afebrile and no new signs of infection.  Remains afebrile and saturating well on room air with no increased work of breathing.  I do not think that she has an active pneumonia at this time.  Completed 7 days of vancomycin on 4/11 and prior to that completed 7 days of Zosyn.  ESBL UTI- resolved Completed Zosyn course as above.  No suprapubic tenderness on exam.  Diet: Tube feeds, dysphagia 2 VTE: SCDs Start: 03/02/24 1229  Code: DNR PT/OT recommendations: SNF, patient reports that she would like to go home instead.  Not medically ready for discharge.  [name], MD PGY-1 Please contact the on call pager at [phone] for any urgent or emergent needs. 12:35 PM 03/27/2024

## 2024-03-27 NOTE — Progress Notes (Signed)
 LTM EEG hooked up and running - no initial skin breakdown - push button tested - [facility] monitoring. Pt altered and event button education with RN  CT leads used.

## 2024-03-28 ENCOUNTER — Encounter (HOSPITAL_COMMUNITY)

## 2024-03-28 DIAGNOSIS — E7220 Disorder of urea cycle metabolism, unspecified: Secondary | ICD-10-CM

## 2024-03-28 DIAGNOSIS — S065XAA Traumatic subdural hemorrhage with loss of consciousness status unknown, initial encounter: Secondary | ICD-10-CM | POA: Diagnosis not present

## 2024-03-28 DIAGNOSIS — I619 Nontraumatic intracerebral hemorrhage, unspecified: Secondary | ICD-10-CM | POA: Diagnosis not present

## 2024-03-28 DIAGNOSIS — G40901 Epilepsy, unspecified, not intractable, with status epilepticus: Secondary | ICD-10-CM | POA: Diagnosis not present

## 2024-03-28 DIAGNOSIS — G9340 Encephalopathy, unspecified: Secondary | ICD-10-CM | POA: Diagnosis not present

## 2024-03-28 DIAGNOSIS — R569 Unspecified convulsions: Secondary | ICD-10-CM | POA: Diagnosis not present

## 2024-03-28 LAB — GLUCOSE, CAPILLARY
Glucose-Capillary: 121 mg/dL — ABNORMAL HIGH (ref 70–99)
Glucose-Capillary: 124 mg/dL — ABNORMAL HIGH (ref 70–99)
Glucose-Capillary: 153 mg/dL — ABNORMAL HIGH (ref 70–99)
Glucose-Capillary: 205 mg/dL — ABNORMAL HIGH (ref 70–99)
Glucose-Capillary: 209 mg/dL — ABNORMAL HIGH (ref 70–99)
Glucose-Capillary: 248 mg/dL — ABNORMAL HIGH (ref 70–99)

## 2024-03-28 MED ORDER — GABAPENTIN 250 MG/5ML PO SOLN
200.0000 mg | Freq: Two times a day (BID) | ORAL | Status: DC
  Administered 2024-03-28 – 2024-03-30 (×5): 200 mg via ORAL
  Filled 2024-03-28 (×6): qty 4

## 2024-03-28 NOTE — Progress Notes (Signed)
LTM EEG D/C'd. No skin break down noted. [facility] notified.

## 2024-03-28 NOTE — Progress Notes (Addendum)
 Subjective  Brother and friend at bedside today.  She is able to wake up after calling her name.  She does say that she is hungry.  She does recognize and knows her name and she knows that she is in the hospital.  Denies any pain.  Will then become sleepy and drowsy.  Vitals:   03/28/24 0818 03/28/24 1058  BP: 129/75 (!) 141/83  Pulse: 94 84  Resp: 19 17  Temp: 98.6 F (37 C) 98.6 F (37 C)  SpO2: 98% 97%     Physical Exam: Constitutional: chronically ill-appearing, lying in bed, in no acute distress Cardiovascular: regular rate and rhythm Pulmonary/Chest: normal work of breathing, auscultation of the lungs limited to the lower lateral fields which are clear to auscultation but diminished breath sounds. Abdominal: Nontender to palpation, no suprapubic tenderness Neurological: alert to self and location not to time Skin: warm and dry, bilateral heels without evidence of pressure injury  Weight change:   Intake/Output Summary (Last 24 hours) at 03/28/2024 1255 Last data filed at 03/28/2024 0420 Gross per 24 hour  Intake 1639.25 ml  Output 800 ml  Net 839.25 ml   Net IO Since Admission: 7,580.41 mL [03/28/24 1255]  Labs, images, and other studies    Latest Ref Rng & Units 03/27/2024    6:01 AM 03/26/2024    8:53 AM 03/24/2024    8:38 AM  CBC  WBC 4.0 - 10.5 K/uL 15.1  16.1  16.8   Hemoglobin 12.0 - 15.0 g/dL 16.1  09.6  04.5   Hematocrit 36.0 - 46.0 % 36.9  36.9  33.1   Platelets 150 - 400 K/uL 387  406  329        Latest Ref Rng & Units 03/27/2024    6:01 AM 03/26/2024    8:53 AM 03/24/2024    8:38 AM  BMP  Glucose 70 - 99 mg/dL 409  811  914   BUN 6 - 20 mg/dL 30  25  22    Creatinine 0.44 - 1.00 mg/dL 7.82  9.56  2.13   Sodium 135 - 145 mmol/L 134  135  134   Potassium 3.5 - 5.1 mmol/L 4.1  4.7  4.5   Chloride 98 - 111 mmol/L 99  98  99   CO2 22 - 32 mmol/L 22  26  25    Calcium 8.9 - 10.3 mg/dL 9.1  8.9  8.8      Assessment and plan Hospital  day 26  [name] is a 58 y.o.with  significant for 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 coli. Urosepsis.  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  CT does not show any signs of worsening SDH.  Gabapentin medication is being tapered off with hope of reducing any excess sedation.  EEG did not show any signs of seizures.  Does have hyperammonemia and Depakote is being discontinued (ammonia doesn't correlate well with encephalopathy unfortunately). -Continue with Keppra 500 mg twice daily -Continue with gabapentin 200 mg twice daily -Discontinue Depakote  Protein Calorie Malnutrition Has been meeting her caloric needs with her core track.  However this is not a long-term solution  and she has been eating 0% of her food..  It seems like today she tried to eat but per brother she was not able to swallow the the yogurt and was asking if it could be more thinned out.  Likely that her decreased consciousness is contributing to her not being able to eat as she is barely able to to nod her head in response to questions at this time.  With this level of consciousness she would not be able to pass a swallow study.  Spoke to RD, unlikely that at the rate that she is getting her feeds that her stomach is even being distended to trigger sensation of fullness.  Thus, less likely that she would benefit from night feeds and attempting feeds in the morning.  She would likely need a feeding tube we will discuss with legal guardian. -cw coretrak -continue to encourage feeding when she is awake enough. -cw mirtazapine 7.5mg  qhs  Coated tongue  Somewhat improved from yesterday and her mouth is not so dry. -Continue  with good oral hygiene.  Pressure ulcers Stage II on sacrum and on linear crease of the buttocks on the left.   Cw wound care (manuka honey, butt cream, mepilex, and frequent repositioning every 2 hours while awake).   HFrEF (25-30% in 2023) She is currently on hydralazine 25 mg TID, Isordil 20 mg TID, Lopressor 75 BID, and Aldactone 25 daily. Not on Entresto with hx of angioedema. Not a good candidate for SGLT2-I given recent ESBL UTI.  She is euvolemic at this time with no increased oxygen requirements.  HTN Normotensive after reduced dose of hydralazine to 25 TID and continue Isordil 20 TID.  HLD Continue home Lipitor  T2DM CBG's stable on current regimen of Semglee 30 units daily, 10 units of mealtime insulin and sliding scale.  She does not have any active infection and has working renal function I do think that we can consider starting metformin in the future if hepatic function is normal. -Continue with Semglee 30 units daily and 10 units of mealtime insulin on sliding scale.  MRSA Associated HCAP- resolved Afebrile and no new signs of infection.  Remains afebrile and saturating well on room air with no increased work of breathing.  I do not think that she has an active pneumonia at this time.  Completed 7 days of vancomycin on 4/11 and prior to that completed 7 days of Zosyn.  ESBL UTI- resolved Completed Zosyn course as above.  No suprapubic tenderness on exam.  Diet: Tube feeds, dysphagia 2 VTE: SCDs Start: 03/02/24 1229  Code: DNR PT/OT recommendations: SNF, patient reports that she would like to go home instead.  Not medically ready for discharge.  Caitlyn Bloomer, MD PGY-1 Please contact the on call pager at [phone] for any urgent or emergent needs. 12:55 PM 03/28/2024

## 2024-03-28 NOTE — Progress Notes (Addendum)
 Subjective: Due to worsening mentation, patient had CT head yesterday.  Also checked Depakote  and ammonia levels as well as repeated a video EEG overnight.   ROS: negative except above  Examination  Vital signs in last 24 hours: Temp:  [98.2 F (36.8 C)-98.9 F (37.2 C)] 98.6 F (37 C) (04/16 0818) Pulse Rate:  [83-97] 94 (04/16 0818) Resp:  [18-20] 19 (04/16 0818) BP: (112-133)/(73-93) 129/75 (04/16 0818) SpO2:  [93 %-98 %] 98 % (04/16 0818)  General: lying in bed, NAD Neuro: Opens eyes to verbal stimulation, able to tell me her name, oriented to place, month: September, follows simple one-step commands, cranial nerves appear grossly intact, antigravity strength in right upper and right lower extremity, able to move left upper and left lower extremity to antigravity strength but appears to have left hemiparesis which is baseline  Basic Metabolic Panel: Recent Labs  Lab 03/22/24 0624 03/23/24 0731 03/24/24 0838 03/26/24 0853 03/27/24 0601  NA 135 134* 134* 135 134*  K 4.3 4.9 4.5 4.7 4.1  CL 97* 96* 99 98 99  CO2 28 27 25 26 22   GLUCOSE 181* 165* 145* 126* 119*  BUN 22* 24* 22* 25* 30*  CREATININE 0.74 0.86 0.80 0.95 1.07*  CALCIUM  8.8* 8.6* 8.8* 8.9 9.1    CBC: Recent Labs  Lab 03/22/24 0624 03/23/24 0731 03/24/24 0838 03/26/24 0853 03/27/24 0601  WBC 18.2* 14.4* 16.8* 16.1* 15.1*  NEUTROABS 12.8* 9.9*  --  9.8*  --   HGB 11.3* 11.1* 10.5* 11.8* 11.7*  HCT 36.4 36.2 33.1* 36.9 36.9  MCV 86.9 87.0 86.9 86.0 85.2  PLT 329 311 329 406* 387     Coagulation Studies: No results for input(s): "LABPROT", "INR" in the last 72 hours.  Imaging personally reviewed  CTH wo contrast 03/27/2024:  Previously seen right-sided subdural hematoma continues to become less dense. This is lentiform in shape and measures 5.8 x 5.6 cm in diameter with a maximal thickness of 2.6 cm. This has some mass-effect upon the right hemisphere but there is no midline shift because of volume  loss related to the old stroke. Previously seen intraparenchymal hemorrhage within the right temporal lobe now only shows mild residual low density. No evidence of new or worsening bleeding. Atrophy, chronic small-vessel ischemic changes of the white matter. Old infarction in the right insula and basal ganglia region.   ASSESSMENT AND PLAN: 58 y.o. 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, concern for focal status epilepticus on 03/07/2024   Convulsive focal status epilepticus, resolved Subclinical seizures, resolved as of 2155 on 03/09/2024 Subdural hematoma Right temporal hemorrhage Acute encephalopathy due to seizures/medications, recurred Hyperammonemia due to depakote  use - Continue Keppra  500 mg twice daily, Vimpat  200 mg twice daily - DC Depakote  due to hyperammonemia - Reduce Gabapentin  to 2000mg  BID to minimize sedation - DC LTM EEG - Continue seizure precautions - Recommend follow-up with neurology in 1 to 2 months - Discussed plan with medicine team via secure chat  Seizure precautions: Per [place]  DMV statutes, patients with seizures are not allowed to drive until they have been seizure-free for six months and cleared by a physician    Use caution when using heavy equipment or power tools. Avoid working on ladders or at heights. Take showers instead of baths. Ensure the water  temperature is not too high on the home water  heater. Do not go swimming alone. Do not lock yourself in a room alone (i.e. bathroom).  When caring for infants or small children, sit down when holding, feeding, or changing them to minimize risk of injury to the child in the event you have a seizure. Maintain good sleep hygiene. Avoid alcohol .    If patient has another seizure, call 911 and bring them back to the ED if: A.  The seizure lasts longer than 5 minutes.      B.  The patient doesn't wake shortly after the seizure or has new  problems such as difficulty seeing, speaking or moving following the seizure C.  The patient was injured during the seizure D.  The patient has a temperature over 102 F (39C) E.  The patient vomited during the seizure and now is having trouble breathing    During the Seizure   - First, ensure adequate ventilation and place patients on the floor on their left side  Loosen clothing around the neck and ensure the airway is patent. If the patient is clenching the teeth, do not force the mouth open with any object as this can cause severe damage - Remove all items from the surrounding that can be hazardous. The patient may be oblivious to what's happening and may not even know what he or she is doing. If the patient is confused and wandering, either gently guide him/her away and block access to outside areas - Reassure the individual and be comforting - Call 911. In most cases, the seizure ends before EMS arrives. However, there are cases when seizures may last over 3 to 5 minutes. Or the individual may have developed breathing difficulties or severe injuries. If a pregnant patient or a person with diabetes develops a seizure, it is prudent to call an ambulance.   After the Seizure (Postictal Stage)   After a seizure, most patients experience confusion, fatigue, muscle pain and/or a headache. Thus, one should permit the individual to sleep. For the next few days, reassurance is essential. Being calm and helping reorient the person is also of importance.   Most seizures are painless and end spontaneously. Seizures are not harmful to others but can lead to complications such as stress on the lungs, brain and the heart. Individuals with prior lung problems may develop labored breathing and respiratory distress.         I have spent a total of  37 minutes with the patient reviewing hospital notes,  test results, labs and examining the patient as well as establishing an assessment and plan. > 50% of  time was spent in direct patient care.    Roxy Cordial Epilepsy [facility] For questions after 5pm please refer to AMION to reach the Neurologist on call

## 2024-03-28 NOTE — Procedures (Addendum)
 Patient Name: [name]  MRN: [id number]  Epilepsy Attending: Arleene Lack  Referring Physician/Provider: Arleene Lack, MD  Duration: 03/27/2024 1509 to 03/28/2024 1191  Patient history:  58 y.o. 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, concern for focal status epilepticus this morning. EEG to evaluate for seizure   Level of alertness: Awake, asleep  AEDs during EEG study: LEV, LCM, VPA, GBP  Technical aspects: This EEG study was done with scalp electrodes positioned according to the 10-20 International system of electrode placement. Electrical activity was reviewed with band pass filter of 1-70Hz , sensitivity of 7 uV/mm, display speed of 43mm/sec with a 60Hz  notched filter applied as appropriate. EEG data were recorded continuously and digitally stored.  Video monitoring was available and reviewed as appropriate.  Description: EEG showed continuous 5-7hz  theta slowing in left hemisphere as well as low amplitude 3-5hz  theta-delta slowing in right hemisphere. Hyperventilation and photic stimulation were not performed.      ABNORMALITY -Continuous slow, generalized and lateralized right hemisphere   IMPRESSION: This study was suggestive of cortical dysfunction in right hemisphere likely secondary to underlying structural abnormality/ sub dural hematoma. Additionally there was moderate diffuse encephalopathy.  No seizure were noted.   Elzie Knisley O Jaielle Dlouhy

## 2024-03-28 NOTE — Plan of Care (Signed)
  Problem: Nutritional: Goal: Maintenance of adequate nutrition will improve Outcome: Progressing Goal: Progress toward achieving an optimal weight will improve Outcome: Progressing   Problem: Skin Integrity: Goal: Risk for impaired skin integrity will decrease Outcome: Progressing   Problem: Clinical Measurements: Goal: Ability to maintain clinical measurements within normal limits will improve Outcome: Progressing Goal: Will remain free from infection Outcome: Progressing Goal: Diagnostic test results will improve Outcome: Progressing Goal: Respiratory complications will improve Outcome: Progressing Goal: Cardiovascular complication will be avoided Outcome: Progressing   Problem: Pain Managment: Goal: General experience of comfort will improve and/or be controlled Outcome: Progressing   Problem: Safety: Goal: Ability to remain free from injury will improve Outcome: Progressing    Problem: Elimination: Goal: Will not experience complications related to bowel motility Outcome: Progressing Goal: Will not experience complications related to urinary retention Outcome: Progressing
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
## 2024-03-29 ENCOUNTER — Inpatient Hospital Stay (HOSPITAL_COMMUNITY)

## 2024-03-29 DIAGNOSIS — Z789 Other specified health status: Secondary | ICD-10-CM | POA: Diagnosis not present

## 2024-03-29 DIAGNOSIS — R4189 Other symptoms and signs involving cognitive functions and awareness: Secondary | ICD-10-CM | POA: Insufficient documentation

## 2024-03-29 DIAGNOSIS — G40909 Epilepsy, unspecified, not intractable, without status epilepticus: Secondary | ICD-10-CM

## 2024-03-29 DIAGNOSIS — S065XAA Traumatic subdural hemorrhage with loss of consciousness status unknown, initial encounter: Secondary | ICD-10-CM | POA: Diagnosis not present

## 2024-03-29 LAB — COMPREHENSIVE METABOLIC PANEL WITH GFR
ALT: 17 U/L (ref 0–44)
AST: 19 U/L (ref 15–41)
Albumin: 2.7 g/dL — ABNORMAL LOW (ref 3.5–5.0)
Alkaline Phosphatase: 97 U/L (ref 38–126)
Anion gap: 8 (ref 5–15)
BUN: 25 mg/dL — ABNORMAL HIGH (ref 6–20)
CO2: 24 mmol/L (ref 22–32)
Calcium: 8.9 mg/dL (ref 8.9–10.3)
Chloride: 102 mmol/L (ref 98–111)
Creatinine, Ser: 0.77 mg/dL (ref 0.44–1.00)
GFR, Estimated: >60 mL/min (ref >60)
Glucose, Bld: 132 mg/dL — ABNORMAL HIGH (ref 70–99)
Potassium: 4.2 mmol/L (ref 3.5–5.1)
Sodium: 134 mmol/L — ABNORMAL LOW (ref 135–145)
Total Bilirubin: 0.5 mg/dL (ref 0.0–1.2)
Total Protein: 7.9 g/dL (ref 6.5–8.1)

## 2024-03-29 LAB — CBC
HCT: 37.9 % (ref 36.0–46.0)
Hemoglobin: 12 g/dL (ref 12.0–15.0)
MCH: 27.4 pg (ref 26.0–34.0)
MCHC: 31.7 g/dL (ref 30.0–36.0)
MCV: 86.5 fL (ref 80.0–100.0)
Platelets: 339 10*3/uL (ref 150–400)
RBC: 4.38 MIL/uL (ref 3.87–5.11)
RDW: 14.4 % (ref 11.5–15.5)
WBC: 15.6 10*3/uL — ABNORMAL HIGH (ref 4.0–10.5)
nRBC: 0 % (ref 0.0–0.2)

## 2024-03-29 LAB — GLUCOSE, CAPILLARY
Glucose-Capillary: 132 mg/dL — ABNORMAL HIGH (ref 70–99)
Glucose-Capillary: 137 mg/dL — ABNORMAL HIGH (ref 70–99)
Glucose-Capillary: 199 mg/dL — ABNORMAL HIGH (ref 70–99)
Glucose-Capillary: 205 mg/dL — ABNORMAL HIGH (ref 70–99)
Glucose-Capillary: 207 mg/dL — ABNORMAL HIGH (ref 70–99)
Glucose-Capillary: 208 mg/dL — ABNORMAL HIGH (ref 70–99)
Glucose-Capillary: 210 mg/dL — ABNORMAL HIGH (ref 70–99)

## 2024-03-29 MED ORDER — ACETAMINOPHEN 160 MG/5ML PO SOLN
650.0000 mg | Freq: Four times a day (QID) | ORAL | Status: DC | PRN
  Administered 2024-03-29 – 2024-03-30 (×2): 650 mg
  Filled 2024-03-29 (×2): qty 20.3

## 2024-03-29 MED ORDER — FREE WATER
100.0000 mL | Freq: Four times a day (QID) | Status: DC
  Administered 2024-03-29 – 2024-04-05 (×27): 100 mL

## 2024-03-29 MED ORDER — ACETAMINOPHEN 160 MG/5ML PO SOLN: 500.0000 mg | Freq: Four times a day (QID) | ORAL | Status: DC | PRN

## 2024-03-29 NOTE — Progress Notes (Signed)
 PT Cancellation Note  Patient Details Name: [name] MRN: [id number] DOB: September 01, 1966   Cancelled Treatment:    Reason Eval/Treat Not Completed: Medical issues which prohibited therapy (MD present in room as RN reporting new facial/throat swelling in patient today. Will hold therapy until tomorrow.)   [name] 03/29/2024, 3:42 PM
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
## 2024-03-29 NOTE — Plan of Care (Signed)
  Problem: Nutritional: Goal: Maintenance of adequate nutrition will improve Outcome: Progressing Goal: Progress toward achieving an optimal weight will improve Outcome: Progressing   Problem: Skin Integrity: Goal: Risk for impaired skin integrity will decrease Outcome: Progressing   Problem: Clinical Measurements: Goal: Ability to maintain clinical measurements within normal limits will improve Outcome: Progressing Goal: Will remain free from infection Outcome: Progressing Goal: Diagnostic test results will improve Outcome: Progressing Goal: Respiratory complications will improve Outcome: Progressing Goal: Cardiovascular complication will be avoided Outcome: Progressing   Problem: Nutrition: Goal: Adequate nutrition will be maintained Outcome: Progressing   Problem: Elimination: Goal: Will not experience complications related to bowel motility Outcome: Progressing Goal: Will not experience complications related to urinary retention Outcome: Progressing   Problem: Safety: Goal: Ability to remain free from injury will improve Outcome: Progressing

## 2024-03-29 NOTE — Progress Notes (Signed)
 Noted increased swelling to patient's throat and jaw area. Notified MD Alexander-Savino who  assessed patient. VSS, O2 97% on RA. Cxray ordered.

## 2024-03-30 DIAGNOSIS — S069X9A Unspecified intracranial injury with loss of consciousness of unspecified duration, initial encounter: Secondary | ICD-10-CM | POA: Diagnosis not present

## 2024-03-30 LAB — COMPREHENSIVE METABOLIC PANEL WITH GFR
ALT: 19 U/L (ref 0–44)
AST: 18 U/L (ref 15–41)
Albumin: 2.9 g/dL — ABNORMAL LOW (ref 3.5–5.0)
Alkaline Phosphatase: 100 U/L (ref 38–126)
Anion gap: 8 (ref 5–15)
BUN: 26 mg/dL — ABNORMAL HIGH (ref 6–20)
CO2: 27 mmol/L (ref 22–32)
Calcium: 9.2 mg/dL (ref 8.9–10.3)
Chloride: 101 mmol/L (ref 98–111)
Creatinine, Ser: 0.83 mg/dL (ref 0.44–1.00)
GFR, Estimated: >60 mL/min (ref >60)
Glucose, Bld: 96 mg/dL (ref 70–99)
Potassium: 4 mmol/L (ref 3.5–5.1)
Sodium: 136 mmol/L (ref 135–145)
Total Bilirubin: 0.4 mg/dL (ref 0.0–1.2)
Total Protein: 8.3 g/dL — ABNORMAL HIGH (ref 6.5–8.1)

## 2024-03-30 LAB — GLUCOSE, CAPILLARY
Glucose-Capillary: 118 mg/dL — ABNORMAL HIGH (ref 70–99)
Glucose-Capillary: 109 mg/dL — ABNORMAL HIGH (ref 70–99)
Glucose-Capillary: 165 mg/dL — ABNORMAL HIGH (ref 70–99)
Glucose-Capillary: 160 mg/dL — ABNORMAL HIGH (ref 70–99)
Glucose-Capillary: 194 mg/dL — ABNORMAL HIGH (ref 70–99)
Glucose-Capillary: 189 mg/dL — ABNORMAL HIGH (ref 70–99)

## 2024-03-30 LAB — CBC WITH DIFFERENTIAL/PLATELET
Abs Immature Granulocytes: 0.04 10*3/uL (ref 0.00–0.07)
Basophils Absolute: 0.1 10*3/uL (ref 0.0–0.1)
Basophils Relative: 1 %
Eosinophils Absolute: 0.3 10*3/uL (ref 0.0–0.5)
Eosinophils Relative: 2 %
HCT: 39.9 % (ref 36.0–46.0)
Hemoglobin: 12.3 g/dL (ref 12.0–15.0)
Immature Granulocytes: 0 %
Lymphocytes Relative: 25 %
Lymphs Abs: 3.3 10*3/uL (ref 0.7–4.0)
MCH: 27 pg (ref 26.0–34.0)
MCHC: 30.8 g/dL (ref 30.0–36.0)
MCV: 87.7 fL (ref 80.0–100.0)
Monocytes Absolute: 1.5 10*3/uL — ABNORMAL HIGH (ref 0.1–1.0)
Monocytes Relative: 11 %
Neutro Abs: 8 10*3/uL — ABNORMAL HIGH (ref 1.7–7.7)
Neutrophils Relative %: 61 %
Platelets: 336 10*3/uL (ref 150–400)
RBC: 4.55 MIL/uL (ref 3.87–5.11)
RDW: 14.7 % (ref 11.5–15.5)
WBC: 13.1 10*3/uL — ABNORMAL HIGH (ref 4.0–10.5)
nRBC: 0 % (ref 0.0–0.2)

## 2024-03-30 MED ORDER — GABAPENTIN 250 MG/5ML PO SOLN
100.0000 mg | Freq: Two times a day (BID) | ORAL | Status: DC
  Administered 2024-03-30 – 2024-03-31 (×3): 100 mg via ORAL
  Filled 2024-03-30 (×4): qty 2
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
## 2024-03-30 NOTE — TOC Progression Note (Signed)
 Transition of Care [facility]) - Progression Note    Patient Details  Name: [name] MRN: [id number] Date of Birth: 07-16-1966  Transition of Care [facility]) CM/SW Contact  Jeffory Mings, [place] Phone Number: 03/30/2024, 9:54 AM  Clinical Narrative: MD progress note from yesterday emailed to Alycia Babcock ([email or web address]) with DSS at her request.   Krystle Polcyn, MSW, LCSW [phone] (coverage)       Expected Discharge Plan: Skilled Nursing Facility Barriers to Discharge: Continued Medical Work up, SNF Pending bed offer  Expected Discharge Plan and Services In-house Referral: Clinical Social Work   Post Acute Care Choice: Skilled Nursing Facility Living arrangements for the past 2 months: Apartment                                       Social Determinants of Health (SDOH) Interventions SDOH Screenings   Food Insecurity: No Food Insecurity (03/02/2024)  Housing: High Risk (03/02/2024)  Transportation Needs: Unmet Transportation Needs (03/02/2024)  Utilities: At Risk (03/02/2024)  Alcohol  Screen: Low Risk  (03/23/2023)  Depression (PHQ2-9): High Risk (06/02/2023)  Financial Resource Strain: Medium Risk (03/23/2023)  Physical Activity: Insufficiently Active (03/23/2023)  Social Connections: Moderately Isolated (03/02/2024)  Tobacco Use: Medium Risk (03/02/2024)    Readmission Risk Interventions    03/11/2023    3:14 PM  Readmission Risk Prevention Plan  Transportation Screening Complete  PCP or Specialist Appt within 5-7 Days Complete  Home Care Screening Complete  Medication Review (RN CM) Referral to Pharmacy
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
## 2024-03-30 NOTE — Plan of Care (Signed)
  Problem: Fluid Volume: Goal: Ability to maintain a balanced intake and output will improve Outcome: Progressing   Problem: Metabolic: Goal: Ability to maintain appropriate glucose levels will improve Outcome: Progressing   Problem: Nutritional: Goal: Maintenance of adequate nutrition will improve Outcome: Progressing   Problem: Skin Integrity: Goal: Risk for impaired skin integrity will decrease Outcome: Progressing   Problem: Tissue Perfusion: Goal: Adequacy of tissue perfusion will improve Outcome: Progressing   Problem: Education: Goal: Knowledge of General Education information will improve Description: Including pain rating scale, medication(s)/side effects and non-pharmacologic comfort measures Outcome: Progressing   Problem: Health Behavior/Discharge Planning: Goal: Ability to manage health-related needs will improve Outcome: Progressing   Problem: Clinical Measurements: Goal: Diagnostic test results will improve Outcome: Progressing Goal: Respiratory complications will improve Outcome: Progressing Goal: Cardiovascular complication will be avoided Outcome: Progressing   Problem: Nutrition: Goal: Adequate nutrition will be maintained Outcome: Progressing   Problem: Coping: Goal: Level of anxiety will decrease Outcome: Progressing   Problem: Elimination: Goal: Will not experience complications related to urinary retention Outcome: Progressing   Problem: Pain Managment: Goal: General experience of comfort will improve and/or be controlled Outcome: Progressing   Problem: Safety: Goal: Ability to remain free from injury will improve Outcome: Progressing   Problem: Skin Integrity: Goal: Risk for impaired skin integrity will decrease Outcome: Progressing   Problem: Medication: Goal: Risk for medication side effects will decrease Outcome: Progressing   Problem: Safety: Goal: Verbalization of understanding the information provided will  improve Outcome: Progressing

## 2024-03-30 NOTE — Progress Notes (Signed)
 SLP Cancellation Note  Patient Details Name: [name] MRN: [id number] DOB: 09-05-1966   Cancelled treatment:       Reason Eval/Treat Not Completed: Fatigue/lethargy limiting ability to participate (Pt unable to rouse for meaningful participation in ST. Will continue efforts as appropriate.)  Delon Bangs, M.S., CCC-SLP Speech-Language Pathologist Secure Chat Preferred  O: [phone]  [name] 03/30/2024, 12:14 PM

## 2024-03-31 DIAGNOSIS — S069X9A Unspecified intracranial injury with loss of consciousness of unspecified duration, initial encounter: Secondary | ICD-10-CM | POA: Diagnosis not present

## 2024-03-31 LAB — CBC WITH DIFFERENTIAL/PLATELET
Abs Immature Granulocytes: 0.06 10*3/uL (ref 0.00–0.07)
Basophils Absolute: 0.1 10*3/uL (ref 0.0–0.1)
Basophils Relative: 1 %
Eosinophils Absolute: 0.3 10*3/uL (ref 0.0–0.5)
Eosinophils Relative: 2 %
HCT: 40.5 % (ref 36.0–46.0)
Hemoglobin: 12.5 g/dL (ref 12.0–15.0)
Immature Granulocytes: 0 %
Lymphocytes Relative: 26 %
Lymphs Abs: 3.6 10*3/uL (ref 0.7–4.0)
MCH: 27 pg (ref 26.0–34.0)
MCHC: 30.9 g/dL (ref 30.0–36.0)
MCV: 87.5 fL (ref 80.0–100.0)
Monocytes Absolute: 1.5 10*3/uL — ABNORMAL HIGH (ref 0.1–1.0)
Monocytes Relative: 11 %
Neutro Abs: 8.3 10*3/uL — ABNORMAL HIGH (ref 1.7–7.7)
Neutrophils Relative %: 60 %
Platelets: 331 10*3/uL (ref 150–400)
RBC: 4.63 MIL/uL (ref 3.87–5.11)
RDW: 14.6 % (ref 11.5–15.5)
WBC: 13.9 10*3/uL — ABNORMAL HIGH (ref 4.0–10.5)
nRBC: 0 % (ref 0.0–0.2)

## 2024-03-31 LAB — GLUCOSE, CAPILLARY
Glucose-Capillary: 155 mg/dL — ABNORMAL HIGH (ref 70–99)
Glucose-Capillary: 133 mg/dL — ABNORMAL HIGH (ref 70–99)
Glucose-Capillary: 223 mg/dL — ABNORMAL HIGH (ref 70–99)
Glucose-Capillary: 234 mg/dL — ABNORMAL HIGH (ref 70–99)
Glucose-Capillary: 137 mg/dL — ABNORMAL HIGH (ref 70–99)
Glucose-Capillary: 161 mg/dL — ABNORMAL HIGH (ref 70–99)

## 2024-03-31 LAB — COMPREHENSIVE METABOLIC PANEL WITH GFR
ALT: 23 U/L (ref 0–44)
AST: 21 U/L (ref 15–41)
Albumin: 2.9 g/dL — ABNORMAL LOW (ref 3.5–5.0)
Alkaline Phosphatase: 98 U/L (ref 38–126)
Anion gap: 9 (ref 5–15)
BUN: 26 mg/dL — ABNORMAL HIGH (ref 6–20)
CO2: 27 mmol/L (ref 22–32)
Calcium: 9.2 mg/dL (ref 8.9–10.3)
Chloride: 99 mmol/L (ref 98–111)
Creatinine, Ser: 0.86 mg/dL (ref 0.44–1.00)
GFR, Estimated: >60 mL/min (ref >60)
Glucose, Bld: 118 mg/dL — ABNORMAL HIGH (ref 70–99)
Potassium: 3.8 mmol/L (ref 3.5–5.1)
Sodium: 135 mmol/L (ref 135–145)
Total Bilirubin: 0.5 mg/dL (ref 0.0–1.2)
Total Protein: 8.2 g/dL — ABNORMAL HIGH (ref 6.5–8.1)

## 2024-03-31 NOTE — Progress Notes (Addendum)
 Subjective  She is doing well this morning with continued improvement in her mentation.  She would like to get up into the chair today and she feels like she is eating better.  She has an optimistic outlook.  Vitals:   03/31/24 0839 03/31/24 1134  BP: 116/61 (!) (P) 144/81  Pulse: 100 (P) 87  Resp: 18 (P) 18  Temp: 98.1 F (36.7 C) (P) 98.3 F (36.8 C)  SpO2: 93% (P) 93%     Physical Exam: Constitutional: chronically ill-appearing, lying in bed, alert Cardiovascular: regular rate and rhythm Pulmonary/Chest: Breathing comfortably in bed at room air, no accessory muscle use Abdominal: Nontender to palpation, soft  Neurological: Alert and oriented Skin: warm and dry   Intake/Output Summary (Last 24 hours) at 03/31/2024 1139 Last data filed at 03/31/2024 0905 Gross per 24 hour  Intake --  Output 1600 ml  Net -1600 ml   Net IO Since Admission: 5,411.74 mL [03/31/24 1139]  Labs, images, and other studies    Latest Ref Rng & Units 03/31/2024    6:52 AM 03/30/2024    6:04 AM 03/29/2024    5:24 AM  CBC  WBC 4.0 - 10.5 K/uL 13.9  13.1  15.6   Hemoglobin 12.0 - 15.0 g/dL 16.1  09.6  04.5   Hematocrit 36.0 - 46.0 % 40.5  39.9  37.9   Platelets 150 - 400 K/uL 331  336  339        Latest Ref Rng & Units 03/31/2024    6:52 AM 03/30/2024    6:04 AM 03/29/2024    5:24 AM  BMP  Glucose 70 - 99 mg/dL 409  96  811   BUN 6 - 20 mg/dL 26  26  25    Creatinine 0.44 - 1.00 mg/dL 9.14  7.82  9.56   Sodium 135 - 145 mmol/L 135  136  134   Potassium 3.5 - 5.1 mmol/L 3.8  4.0  4.2   Chloride 98 - 111 mmol/L 99  101  102   CO2 22 - 32 mmol/L 27  27  24    Calcium  8.9 - 10.3 mg/dL 9.2  9.2  8.9      Assessment and plan Hospital day [address]  Nereida Schepp Jacobs is a 58 y.o.with  significant for prior CVA (2015 with residual left hemiplegia, PAF on Xarelto , seizures (2/2 to prior CVA), chronic combined HF, LBBB s/p CRT-D medtronic, PAD, HTN, DMT2, OSA on CPAP, and obesity who was  admitted for SDH further complicated by status epilepticus requiring ICU/intubation, MRSA HCAP, and E. coli. Urosepsis.  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  She does still talk very softly but is now able to easily move her extremities when she was unable to follow commands in that matter 3 days ago.  Coated tongue  Has been improving with continued oral care. -Continue with good oral hygiene.  Pressure ulcers Stage II on sacrum  and on linear crease of the buttocks on the left.   Cw wound care (manuka honey, butt cream, mepilex, and frequent repositioning every 2 hours while awake).   HFrEF (25-30% in 2023) She is currently on hydralazine  25 mg TID, Isordil  20 mg TID, Lopressor  75 BID, and Aldactone  25 daily. Not on Entresto with hx of angioedema. Not a good candidate for SGLT2-I given recent ESBL UTI.  She is euvolemic at this time with no increased oxygen requirements.  Continue to monitor, specially now that she is getting free water  feeds.    HTN Normotensive after reduced dose of hydralazine  to 25 TID and continue Isordil  20 TID.  HLD Continue home Lipitor  T2DM -Continue with Semglee  30 units daily and 10 units of mealtime insulin  on sliding scale.  MRSA Associated HCAP- resolved Completed 7 days of vancomycin  on 4/11 and prior to that completed 7 days of Zosyn .  ESBL UTI- resolved Completed Zosyn  course as above.  No suprapubic tenderness or CVA tenderness on exam.  Diet: Tube feeds, dysphagia  2 VTE: SCDs Start: 03/02/24 1229  Code: DNR PT/OT recommendations: SNF.  Not medically ready for discharge.  Cleven [place], DO Internal Medicine Resident, PGY-2 Please contact the on call pager at [phone] for any urgent or emergent needs. 11:39 AM 03/31/2024

## 2024-03-31 NOTE — Plan of Care (Signed)
  Problem: Fluid Volume: Goal: Ability to maintain a balanced intake and output will improve Outcome: Progressing   Problem: Metabolic: Goal: Ability to maintain appropriate glucose levels will improve Outcome: Progressing   Problem: Nutritional: Goal: Maintenance of adequate nutrition will improve Outcome: Progressing   Problem: Skin Integrity: Goal: Risk for impaired skin integrity will decrease Outcome: Progressing   Problem: Clinical Measurements: Goal: Will remain free from infection Outcome: Progressing

## 2024-04-01 DIAGNOSIS — S069X9A Unspecified intracranial injury with loss of consciousness of unspecified duration, initial encounter: Secondary | ICD-10-CM | POA: Diagnosis not present

## 2024-04-01 LAB — GLUCOSE, CAPILLARY
Glucose-Capillary: 151 mg/dL — ABNORMAL HIGH (ref 70–99)
Glucose-Capillary: 153 mg/dL — ABNORMAL HIGH (ref 70–99)
Glucose-Capillary: 112 mg/dL — ABNORMAL HIGH (ref 70–99)
Glucose-Capillary: 181 mg/dL — ABNORMAL HIGH (ref 70–99)
Glucose-Capillary: 305 mg/dL — ABNORMAL HIGH (ref 70–99)
Glucose-Capillary: 217 mg/dL — ABNORMAL HIGH (ref 70–99)
Glucose-Capillary: 207 mg/dL — ABNORMAL HIGH (ref 70–99)
Glucose-Capillary: 127 mg/dL — ABNORMAL HIGH (ref 70–99)

## 2024-04-01 MED ORDER — GABAPENTIN 250 MG/5ML PO SOLN
50.0000 mg | Freq: Two times a day (BID) | ORAL | Status: DC
  Administered 2024-04-01 – 2024-04-02 (×3): 50 mg via ORAL
  Filled 2024-04-01 (×4): qty 2

## 2024-04-01 MED ORDER — LOPERAMIDE HCL 1 MG/7.5ML PO SUSP
4.0000 mg | Freq: Once | ORAL | Status: AC
  Administered 2024-04-01: 4 mg
  Filled 2024-04-01: qty 30

## 2024-04-01 NOTE — Plan of Care (Signed)
  Problem: Fluid Volume: Goal: Ability to maintain a balanced intake and output will improve Outcome: Progressing   Problem: Health Behavior/Discharge Planning: Goal: Ability to manage health-related needs will improve Outcome: Progressing   Problem: Metabolic: Goal: Ability to maintain appropriate glucose levels will improve Outcome: Progressing   Problem: Nutritional: Goal: Maintenance of adequate nutrition will improve Outcome: Progressing Goal: Progress toward achieving an optimal weight will improve Outcome: Progressing   Problem: Skin Integrity: Goal: Risk for impaired skin integrity will decrease Outcome: Progressing   Problem: Tissue Perfusion: Goal: Adequacy of tissue perfusion will improve Outcome: Progressing   Problem: Education: Goal: Knowledge of General Education information will improve Description: Including pain rating scale, medication(s)/side effects and non-pharmacologic comfort measures Outcome: Progressing   Problem: Health Behavior/Discharge Planning: Goal: Ability to manage health-related needs will improve Outcome: Progressing   Problem: Clinical Measurements: Goal: Ability to maintain clinical measurements within normal limits will improve Outcome: Progressing Goal: Will remain free from infection Outcome: Progressing Goal: Diagnostic test results will improve Outcome: Progressing Goal: Respiratory complications will improve Outcome: Progressing Goal: Cardiovascular complication will be avoided Outcome: Progressing   Problem: Activity: Goal: Risk for activity intolerance will decrease Outcome: Progressing   Problem: Nutrition: Goal: Adequate nutrition will be maintained Outcome: Progressing   Problem: Coping: Goal: Level of anxiety will decrease Outcome: Progressing   Problem: Elimination: Goal: Will not experience complications related to bowel motility Outcome: Progressing Goal: Will not experience complications related to  urinary retention Outcome: Progressing   Problem: Pain Managment: Goal: General experience of comfort will improve and/or be controlled Outcome: Progressing   Problem: Safety: Goal: Ability to remain free from injury will improve Outcome: Progressing   Problem: Skin Integrity: Goal: Risk for impaired skin integrity will decrease Outcome: Progressing   Problem: Health Behavior/Discharge Planning: Goal: Compliance with prescribed medication regimen will improve Outcome: Progressing   Problem: Medication: Goal: Risk for medication side effects will decrease Outcome: Progressing   Problem: Clinical Measurements: Goal: Complications related to the disease process, condition or treatment will be avoided or minimized Outcome: Progressing Goal: Diagnostic test results will improve Outcome: Progressing   Problem: Safety: Goal: Verbalization of understanding the information provided will improve Outcome: Progressing

## 2024-04-01 NOTE — Progress Notes (Signed)
 Subjective  She is a lot more awake today than when she was 2 days ago.  She is more talkative.  Her voice is more strong.  She says that she will try to move more, and will try to eat more.  However, she feels like the tube is not letting her eat much.  Vitals:   04/01/24 0350 04/01/24 0717  BP: (!) 141/58 116/81  Pulse: 92 90  Resp: 18 18  Temp: 99 F (37.2 C) 98.5 F (36.9 C)  SpO2: 98% 99%     Physical Exam: Constitutional: chronically ill-appearing, lying in bed, alert Cardiovascular: regular rate and rhythm Pulmonary/Chest: Breathing comfortably in bed at room air, no accessory muscle use Abdominal: Nondistended, soft Neurological: Alert and oriented Skin: warm and dry   Intake/Output Summary (Last 24 hours) at 04/01/2024 0807 Last data filed at 04/01/2024 0800 Gross per 24 hour  Intake --  Output 1700 ml  Net -1700 ml   Net IO Since Admission: 4,411.74 mL [04/01/24 0807]  Labs, images, and other studies    Latest Ref Rng & Units 03/31/2024    6:52 AM 03/30/2024    6:04 AM 03/29/2024    5:24 AM  CBC  WBC 4.0 - 10.5 K/uL 13.9  13.1  15.6   Hemoglobin 12.0 - 15.0 g/dL 16.1  09.6  04.5   Hematocrit 36.0 - 46.0 % 40.5  39.9  37.9   Platelets 150 - 400 K/uL 331  336  339        Latest Ref Rng & Units 03/31/2024    6:52 AM 03/30/2024    6:04 AM 03/29/2024    5:24 AM  BMP  Glucose 70 - 99 mg/dL 409  96  811   BUN 6 - 20 mg/dL 26  26  25    Creatinine 0.44 - 1.00 mg/dL 9.14  7.82  9.56   Sodium 135 - 145 mmol/L 135  136  134   Potassium 3.5 - 5.1 mmol/L 3.8  4.0  4.2   Chloride 98 - 111 mmol/L 99  101  102   CO2 22 - 32 mmol/L 27  27  24    Calcium  8.9 - 10.3 mg/dL 9.2  9.2  8.9      Assessment and plan Hospital day 30  [name] is a 58 y.o.with  significant for 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 coli. Urosepsis.  Neurocognitive Decline following TBI Seizure Disorder 2/2 prior CVA Prior improved than when she was 2 days ago.  Her voice is more strong and she is speaking more as well.  She does have some movement on her right side, it is still hard for her to have any movement on the left side, which is where she used to have her deficits after her stroke.  I do believe she will get stronger as she continues to work with physical therapy and attempts to eat more.  -Continue with Keppra  500 mg twice daily lacosamide  200 mg twice daily -Taper gabapentin  to 50 mg twice daily today - Continue working with physical therapy  Goals of Care  She is a lot more stronger today than what she was 2  days ago.  Now she is moving her hands and she is able to wiggle her toes and move some of her lower leg.  She is speaking more loudly and is fighting hard.  She states that her tube is making it hard for her to eat.  I do think that we can do the trial of stopping her feeds tomorrow during the day to see if she will eat more.  Today I did encourage her to move more as much as she can and continue attempting to eat.  Coated tongue  Has been improving with continued oral care. -Continue with good oral hygiene.  Pressure ulcers Stage II on sacrum and on linear crease of the buttocks on the left.   Cw wound care (manuka honey, butt cream, mepilex, and frequent repositioning every 2 hours while awake).   HFrEF (25-30% in 2023) She is currently on hydralazine  25 mg TID, Isordil  20 mg TID, Lopressor  75 BID, and Aldactone  25 daily. Not on Entresto with hx of angioedema. Not a good candidate for SGLT2-I given recent ESBL UTI.  She is euvolemic at this time with no increased oxygen requirements.  HTN Normotensive after reduced dose of hydralazine  to 25 TID and continue Isordil  20  TID.  HLD Continue home Lipitor  T2DM -Continue with Semglee  30 units daily and 10 units of mealtime insulin  on sliding scale.  Will be able to start metformin  once she is tolerating more p.o. intake.  MRSA Associated HCAP- resolved Completed 7 days of vancomycin  on 4/11 and prior to that completed 7 days of Zosyn .  ESBL UTI- resolved Completed Zosyn  course as above.  No suprapubic tenderness or CVA tenderness on exam.  Diet: Tube feeds, dysphagia 2 VTE: SCDs Start: 03/02/24 1229  Code: DNR PT/OT recommendations: SNF.  Not medically ready for discharge.  Jose Ngo, MD Internal Medicine Resident, PGY-1 Please contact the on call pager at [phone] for any urgent or emergent needs. 8:07 AM 04/01/2024

## 2024-04-02 DIAGNOSIS — Z7189 Other specified counseling: Secondary | ICD-10-CM | POA: Diagnosis not present

## 2024-04-02 DIAGNOSIS — S065XAA Traumatic subdural hemorrhage with loss of consciousness status unknown, initial encounter: Secondary | ICD-10-CM | POA: Diagnosis not present

## 2024-04-02 DIAGNOSIS — I5042 Chronic combined systolic (congestive) and diastolic (congestive) heart failure: Secondary | ICD-10-CM

## 2024-04-02 DIAGNOSIS — Z515 Encounter for palliative care: Secondary | ICD-10-CM | POA: Diagnosis not present

## 2024-04-02 DIAGNOSIS — R561 Post traumatic seizures: Principal | ICD-10-CM

## 2024-04-02 DIAGNOSIS — R69 Illness, unspecified: Secondary | ICD-10-CM | POA: Diagnosis not present

## 2024-04-02 LAB — GLUCOSE, CAPILLARY
Glucose-Capillary: 105 mg/dL — ABNORMAL HIGH (ref 70–99)
Glucose-Capillary: 134 mg/dL — ABNORMAL HIGH (ref 70–99)
Glucose-Capillary: 200 mg/dL — ABNORMAL HIGH (ref 70–99)
Glucose-Capillary: 150 mg/dL — ABNORMAL HIGH (ref 70–99)
Glucose-Capillary: 171 mg/dL — ABNORMAL HIGH (ref 70–99)

## 2024-04-02 MED ORDER — ISOSORBIDE DINITRATE 20 MG PO TABS
20.0000 mg | ORAL_TABLET | Freq: Three times a day (TID) | ORAL | Status: DC
  Filled 2024-04-02 (×2): qty 1

## 2024-04-02 MED ORDER — GLUCERNA 1.5 CAL PO LIQD
1000.0000 mL | ORAL | Status: DC
  Administered 2024-04-02 – 2024-04-05 (×4): 1000 mL
  Filled 2024-04-02 (×5): qty 1000

## 2024-04-02 MED ORDER — ACETAMINOPHEN 325 MG PO TABS
650.0000 mg | ORAL_TABLET | Freq: Four times a day (QID) | ORAL | Status: DC | PRN
  Administered 2024-04-02 – 2024-04-12 (×7): 650 mg via ORAL
  Filled 2024-04-02 (×7): qty 2

## 2024-04-02 MED ORDER — ISOSORB DINITRATE-HYDRALAZINE 20-37.5 MG PO TABS
1.0000 | ORAL_TABLET | Freq: Three times a day (TID) | ORAL | Status: DC
  Administered 2024-04-02 – 2024-04-13 (×33): 1 via ORAL
  Filled 2024-04-02 (×34): qty 1

## 2024-04-02 MED ORDER — PSYLLIUM 95 % PO PACK
1.0000 | PACK | Freq: Two times a day (BID) | ORAL | Status: DC
  Administered 2024-04-02: 1 via ORAL
  Filled 2024-04-02 (×3): qty 1

## 2024-04-02 MED ORDER — PROSOURCE TF20 ENFIT COMPATIBL EN LIQD
60.0000 mL | Freq: Every day | ENTERAL | Status: DC
  Administered 2024-04-02 – 2024-04-05 (×4): 60 mL
  Filled 2024-04-02 (×4): qty 60

## 2024-04-02 NOTE — Progress Notes (Signed)
 CBG taken at 0423 is 105 mg / dl. Patient has a Q4 10 units of insulin . Clarified to Dr. Dorthy Gavia MD and gave a verbal order to hold the insulin  10 units Q4 for now since the blood glucose of the patient is okay.

## 2024-04-02 NOTE — Plan of Care (Signed)
  Problem: Fluid Volume: Goal: Ability to maintain a balanced intake and output will improve Outcome: Progressing   Problem: Metabolic: Goal: Ability to maintain appropriate glucose levels will improve Outcome: Progressing   Problem: Nutritional: Goal: Maintenance of adequate nutrition will improve Outcome: Progressing   Problem: Skin Integrity: Goal: Risk for impaired skin integrity will decrease Outcome: Progressing   Problem: Education: Goal: Knowledge of General Education information will improve Description: Including pain rating scale, medication(s)/side effects and non-pharmacologic comfort measures Outcome: Progressing   Problem: Clinical Measurements: Goal: Will remain free from infection Outcome: Progressing

## 2024-04-02 NOTE — TOC Progression Note (Signed)
 Transition of Care [facility]) - Progression Note    Patient Details  Name: [name] MRN: [id number] Date of Birth: 1966/02/27  [facility] CM/SW Contact  Jonathan Neighbor, RN Phone Number: 04/02/2024, 3:34 PM  Clinical Narrative:     Still with cortrak but holding daytime feeds to help pt feel hungry. Pt still with calorie count.  TOC following for SNF placement.  Expected Discharge Plan: Skilled Nursing Facility Barriers to Discharge: Continued Medical Work up, SNF Pending bed offer  Expected Discharge Plan and Services In-house Referral: Clinical Social Work   Post Acute Care Choice: Skilled Nursing Facility Living arrangements for the past 2 months: Apartment                                       Social Determinants of Health (SDOH) Interventions SDOH Screenings   Food Insecurity: No Food Insecurity (03/02/2024)  Housing: High Risk (03/02/2024)  Transportation Needs: Unmet Transportation Needs (03/02/2024)  Utilities: At Risk (03/02/2024)  Alcohol  Screen: Low Risk  (03/23/2023)  Depression (PHQ2-9): High Risk (06/02/2023)  Financial Resource Strain: Medium Risk (03/23/2023)  Physical Activity: Insufficiently Active (03/23/2023)  Social Connections: Moderately Isolated (03/02/2024)  Tobacco Use: Medium Risk (03/02/2024)    Readmission Risk Interventions    03/11/2023    3:14 PM  Readmission Risk Prevention Plan  Transportation Screening Complete  PCP or Specialist Appt within 5-7 Days Complete  Home Care Screening Complete  Medication Review (RN CM) Referral to Pharmacy

## 2024-04-02 NOTE — Progress Notes (Signed)
 Nutrition Follow-up  DOCUMENTATION CODES:  Obesity unspecified  INTERVENTION:  Continue tube feeding via cortrak. Adjust to nocturnal feeds per MD request: Glucerna 1.5 at 70 ml/h x 12 hours (840 ml per day) Prosource TF20 1x/d Free water : 100mL q6h Provides 1340 kcal, 89 gm protein, 638 ml free water  daily ( = TF + flush) Continue current diet as ordered per SLP Encourage PO intake Continue kcal count x 24 more hours.   NUTRITION DIAGNOSIS:  Inadequate oral intake related to inability to eat as evidenced by NPO status. Remains applicable  GOAL:  Patient will meet greater than or equal to 90% of their needs Met w/ TF at goal  MONITOR:  TF tolerance, Labs, Vent status  REASON FOR ASSESSMENT:  Consult Enteral/tube feeding initiation and management  ASSESSMENT:  Pt with hx of prior CVA, seizures, CHF, HTN, T2DM, OSA on CPAP, and GERD. 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 Admitted from home after progressive headaches and vomiting. 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  3/21 admitted to ICU for SDH, on cleviprex  3/24 off cleviprex , transferred out of ICU to IMTS 3/25 seizure activity 3/26 re-admitted to MICU, ventilated 3/27 EEG, lasix  for diuresis 4/2 - pt vomited, TF held but later resumed 4/7 - cortrak placed 4/8- extubated 4/9- SLP recommends continued NPO  4/11 - MBS, DYS 2, honey thick 4/16 - BSE, DYS 1, honey thick 4/21 - adjust to nocturnal feeds  Pt resting in bed at the time of assessment. Awake, but very quiet when she responds. Difficult to understand. Resident team requests that TF be adjusted to nocturnal. Kcal count started over the weekend, intake is not sufficient based on limited documentation so far. Will assess kcal count in AM and make recommendations for long-term enteral management. See results of kcal count below.  Calorie Count: 4/19: breakfast - 0% intake 4/19: lunch - 54 kcal, 2g protein (estimated,  ticket recorded as 10%)  4/20 breakfast 65% per flowsheet (500 kcal, 18g protein) 4/20 lunch 40% per flowsheet (161 kcal, 10g protein) 4/20 dinner 0% per flowsheet Total for the day 4/20: 661kcal, 28g protein (35% of energy, 29% of protein)  Admit weight: 111.1 kg  Current weight: 109 kg  Average Meal Intake: 4/11-4/14: 10% average intake x 5 recorded meals 4/15-4/21: 56% intake x 5 recorded meals  Nutritionally Relevant Medications: Scheduled Meds:  atorvastatin   40 mg Oral Daily   famotidine   20 mg Oral BID   BANATROL TF  60 mL Per Tube BID   free water   100 mL Per Tube Q6H   insulin  aspart  0-20 Units Subcutaneous Q4H   insulin  aspart  10 Units Subcutaneous Q4H   insulin  glargine-yfgn  30 Units Subcutaneous Daily   JUVEN  1 packet Oral BID BM   spironolactone   25 mg Oral Daily   Continuous Infusions:  feeding supplement (GLUCERNA 1.5 CAL) 55 mL/hr at 04/02/24 0315   Labs Reviewed: CBG rages from 105-305 mg/dL over the last 24 hours HgbA1c 11.3% (3/21)  Diet Order:   Diet Order             DIET - DYS 1 Room service appropriate? No; Fluid consistency: Honey Thick  Diet effective now                  EDUCATION NEEDS:  Not appropriate for education at this time  Skin:  Skin Assessment: Skin Integrity Issues: (new 4/10) Skin Integrity Issues:: Stage III Stage III: Ischial tuberosity  Last BM:  4/20 - type 7  Height:  Ht Readings from Last 1 Encounters:  03/19/24 5\' 9"  (1.753 m)   Weight:  Wt Readings from Last 1 Encounters:  03/24/24 109 kg    Ideal Body Weight:  66 kg  BMI:  Body mass index is 35.49 kg/m.  Estimated Nutritional Needs:  Kcal:  1800-2000 kcal/d Protein:  95-110g Fluid:  >2L   Edwena Graham, RD, LDN Registered Dietitian II Please reach out via secure chat
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
## 2024-04-02 NOTE — Progress Notes (Signed)
 Subjective  Much more awake today, has been eating some of her meals.  Brother is at bedside and wondering when she may be discharged.  Vitals:   04/02/24 0817 04/02/24 1237  BP: 120/70 135/77  Pulse:  98  Resp: 17 18  Temp: 98.6 F (37 C) 97.9 F (36.6 C)  SpO2: 95% 96%     Physical Exam: Constitutional: chronically ill-appearing, lying in bed, alert Cardiovascular: regular rate and rhythm Pulmonary/Chest: Breathing comfortably in bed at room air, no accessory muscle use Abdominal: Nondistended, soft Neurological: Alert and oriented Skin: warm and dry   Intake/Output Summary (Last 24 hours) at 04/02/2024 1317 Last data filed at 04/02/2024 0915 Gross per 24 hour  Intake 957.5 ml  Output 1150 ml  Net -192.5 ml   Net IO Since Admission: 8,818.33 mL [04/02/24 1317]  Labs, images, and other studies    Latest Ref Rng & Units 03/31/2024    6:52 AM 03/30/2024    6:04 AM 03/29/2024    5:24 AM  CBC  WBC 4.0 - 10.5 K/uL 13.9  13.1  15.6   Hemoglobin 12.0 - 15.0 g/dL 16.1  09.6  04.5   Hematocrit 36.0 - 46.0 % 40.5  39.9  37.9   Platelets 150 - 400 K/uL 331  336  339        Latest Ref Rng & Units 03/31/2024    6:52 AM 03/30/2024    6:04 AM 03/29/2024    5:24 AM  BMP  Glucose 70 - 99 mg/dL 409  96  811   BUN 6 - 20 mg/dL 26  26  25    Creatinine 0.44 - 1.00 mg/dL 9.14  7.82  9.56   Sodium 135 - 145 mmol/L 135  136  134   Potassium 3.5 - 5.1 mmol/L 3.8  4.0  4.2   Chloride 98 - 111 mmol/L 99  101  102   CO2 22 - 32 mmol/L 27  27  24    Calcium  8.9 - 10.3 mg/dL 9.2  9.2  8.9      Assessment and plan Hospital day 31  [name] is a 58 y.o.with  significant for 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 coli.  Urosepsis.  Neurocognitive Decline following TBI Seizure Disorder 2/2 prior CVA She is much more awake today.  Her voice is more strong, she has been trying to eat some of her meals and eat 40% of them.  She is also moving her right hand and it is stronger than before.  You will likely need aggressive PT OT due to extensive hospitalization. -Continue with Keppra  500 mg twice daily  -cw lacosamide  200 mg twice daily - Stop gabapentin  - PT/OT  Goals of Care  Speech and swallow saw her today, she is more awake, and may be approaching time for her to do an MBS if she is still having an increased alertness.  She is to continue on dysphagia 1 diet.  Daytime feeds are currently being held so that she can eat more as she feels that the core track is not making her hungry.  I have put in another consult with RD at this time. -Continue with  calorie counts -Hold daytime feeds through core track for now - Continue with free water  100mLs q6hrs  Coated tongue  Has been improving with continued oral care. -Continue with good oral hygiene.  Pressure ulcers Stage II on sacrum and on linear crease of the buttocks on the left.   Cw wound care (manuka honey, butt cream, mepilex, and frequent repositioning every 2 hours while awake).   HFrEF (25-30% in 2023) She is currently on hydralazine  25 mg TID, Isordil  20 mg TID, Lopressor  75 BID, and Aldactone  25 daily. Not on Entresto with hx of angioedema. Not a good candidate for SGLT2-I given recent ESBL UTI.  She is euvolemic at this time with no increased oxygen requirements.  Will switch hydralazine  and isordil  to BiDil , will need to monitor blood pressure due to increase in hydralazine  dose. -Start BiDil  20-37.5 mg   HTN Normotensive after reduced dose of hydralazine  to 25 TID and continue Isordil  20 TID.  HLD Continue home Lipitor  T2DM - Stable.  Glucose was 105 and 10 units were held in the morning.  Continue with Semglee  30 units daily and 10 units  of mealtime insulin  on sliding scale.  Will be able to start metformin  once she is tolerating more p.o. intake.  Loose stools Likely due to dysphagia diet at this time.  She is on banana flakes.  Will start Metamucil orally as he cannot began through her core track.  MRSA Associated HCAP- resolved Completed 7 days of vancomycin  on 4/11 and prior to that completed 7 days of Zosyn .  ESBL UTI- resolved Completed Zosyn  course as above.  No suprapubic tenderness or CVA tenderness on exam.  Diet: Tube feeds, dysphagia 1 VTE: SCDs Start: 03/02/24 1229  Code: DNR PT/OT recommendations: SNF.  Not medically ready for discharge.  Jose Ngo, MD Internal Medicine Resident, PGY-1 Please contact the on call pager at [phone] for any urgent or emergent needs. 1:17 PM 04/02/2024
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
## 2024-04-03 ENCOUNTER — Ambulatory Visit: Payer: PRIVATE HEALTH INSURANCE

## 2024-04-03 DIAGNOSIS — R561 Post traumatic seizures: Secondary | ICD-10-CM | POA: Diagnosis not present

## 2024-04-03 LAB — GLUCOSE, CAPILLARY
Glucose-Capillary: 170 mg/dL — ABNORMAL HIGH (ref 70–99)
Glucose-Capillary: 143 mg/dL — ABNORMAL HIGH (ref 70–99)
Glucose-Capillary: 87 mg/dL (ref 70–99)
Glucose-Capillary: 159 mg/dL — ABNORMAL HIGH (ref 70–99)
Glucose-Capillary: 189 mg/dL — ABNORMAL HIGH (ref 70–99)
Glucose-Capillary: 258 mg/dL — ABNORMAL HIGH (ref 70–99)

## 2024-04-03 LAB — CULTURE, BLOOD (ROUTINE X 2)
Culture: NO GROWTH
Culture: NO GROWTH

## 2024-04-03 MED ORDER — BOOST / RESOURCE BREEZE PO LIQD CUSTOM: 1.0000 | Freq: Three times a day (TID) | ORAL | Status: DC

## 2024-04-03 MED ORDER — FOOD THICKENER (SIMPLYTHICK HONEY): 1.0000 | ORAL | Status: DC | PRN

## 2024-04-03 MED ORDER — PSYLLIUM 95 % PO PACK
1.0000 | PACK | Freq: Every day | ORAL | Status: DC
  Administered 2024-04-04 – 2024-04-13 (×8): 1 via ORAL
  Filled 2024-04-03 (×10): qty 1
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
## 2024-04-03 NOTE — Plan of Care (Signed)
  Problem: Metabolic: Goal: Ability to maintain appropriate glucose levels will improve Outcome: Progressing   Problem: Nutritional: Goal: Maintenance of adequate nutrition will improve Outcome: Progressing   Problem: Skin Integrity: Goal: Risk for impaired skin integrity will decrease Outcome: Progressing   

## 2024-04-03 NOTE — Consult Note (Signed)
 WOC Nurse wound follow up Wound type: PI stage 3 to right buttocks/isquium. Pt is using foam dressing on those areas, wounds healed. No sign of injuries. Dressing procedure/placement/frequency: Continue using foam dressing, change every 3 days or PRN. Reassess daily, repositioning the dressing. Suspended the Medihoney.   WOC team will not plan to follow further.  Please reconsult if further assistance is needed. Thank-you,  Rachel Budds BSN, RN, [employer], WOC  (Pager: [phone])
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
## 2024-04-03 NOTE — Progress Notes (Signed)
 Brief Nutrition Note  Attempted to collect tickets for kcal count, no meal tickets in envelope to review. Discussed meal intake with RN. States that for breakfast all she had was 1/2 of her sweet tea and she ate an applesauce cup with her medications. Took her juven by mouth. Unsure about lunch, but noted that it appears all pt had was pureed pineapple with SLP and sips of honey thick water .  Continue to conclude that pt is not taking in adequate PO intake and will not be able to maintain her hydration status and meet her nutritional needs orally. Pt planned to undergo an MBS tomorrow. Will monitor for diet advancement. However, if meal tickets are not recorded for review than kcal count is not an accurate representation of oral intake and will be discontinued.  Edwena Graham, RD, LDN Registered Dietitian II Please reach out via secure chat

## 2024-04-03 NOTE — Progress Notes (Signed)
 Subjective  She would like to eat more, but she does not like the soft diet.  Vitals:   04/03/24 0904 04/03/24 1107  BP: 134/61 130/71  Pulse: 90 80  Resp: 16 16  Temp:  98.2 F (36.8 C)  SpO2:  99%     Physical Exam: Constitutional: chronically ill-appearing, lying in bed, alert Cardiovascular: regular rate and rhythm, no BLE edema Pulmonary/Chest: Breathing comfortably in bed at room air, more tachypneic today, clear to auscultation Abdominal: Nondistended, soft Neurological: Alert and oriented Skin: warm and dry   Intake/Output Summary (Last 24 hours) at 04/03/2024 1446 Last data filed at 04/03/2024 0000 Gross per 24 hour  Intake 777.26 ml  Output --  Net 777.26 ml   Net IO Since Admission: 9,595.59 mL [04/03/24 1446]  Labs, images, and other studies    Latest Ref Rng & Units 03/31/2024    6:52 AM 03/30/2024    6:04 AM 03/29/2024    5:24 AM  CBC  WBC 4.0 - 10.5 K/uL 13.9  13.1  15.6   Hemoglobin 12.0 - 15.0 g/dL 16.1  09.6  04.5   Hematocrit 36.0 - 46.0 % 40.5  39.9  37.9   Platelets 150 - 400 K/uL 331  336  339        Latest Ref Rng & Units 03/31/2024    6:52 AM 03/30/2024    6:04 AM 03/29/2024    5:24 AM  BMP  Glucose 70 - 99 mg/dL 409  96  811   BUN 6 - 20 mg/dL 26  26  25    Creatinine 0.44 - 1.00 mg/dL 9.14  7.82  9.56   Sodium 135 - 145 mmol/L 135  136  134   Potassium 3.5 - 5.1 mmol/L 3.8  4.0  4.2   Chloride 98 - 111 mmol/L 99  101  102   CO2 22 - 32 mmol/L 27  27  24    Calcium  8.9 - 10.3 mg/dL 9.2  9.2  8.9      Assessment and plan Hospital day 32  [name] is a 57 y.o.with  significant for 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 coli. Urosepsis.  Neurocognitive Decline  following TBI Seizure Disorder 2/2 prior CVA Continues to improve.  However she still not eating most of her meals.  Likely need aggressive PT OT due to extensive hospitalization, appreciate PT -Continue with Keppra  500 mg twice daily  -cw lacosamide  200 mg twice daily - PT/OT  Goals of Care  Appreciate swallow, MBS will be done next. Continue with dysphagia 1 diet. -Continue with calorie counts -continue holding daytime feeds through core track for now - Continue with free water  q6hrs  Coated tongue  Has been improving with continued oral care. -Continue with good oral hygiene.  Pressure ulcers Appreciate wound care, cw wound care per their note  HFrEF (25-30% in 2023) She is currently on hydralazine  25 mg TID, Isordil  20 mg TID, Lopressor  75 BID, and Aldactone  25 daily. Not on Entresto with hx of angioedema. Not a good candidate for SGLT2-I given recent ESBL UTI.  She is euvolemic at this time with no increased oxygen requirements.  Will switch  hydralazine  and isordil  to BiDil , will need to monitor blood pressure due to increase in hydralazine  dose. -cw BiDil  20-37.5 mg   HTN BP control as above  HLD Continue home Lipitor  T2DM - Stable.  Continue with Semglee  30 units daily and 10 units of mealtime insulin  on sliding scale.  Will be able to start metformin  once she is tolerating more p.o. intake.  Loose stools Likely due to dysphagia diet at this time.  She is on banana flakes.  Cw Metamucil orally as he cannot began through her core track.  MRSA Associated HCAP- resolved Completed 7 days of vancomycin  on 4/11 and prior to that completed 7 days of Zosyn .  ESBL UTI- resolved Completed Zosyn  course as above.  No suprapubic tenderness or CVA tenderness on exam.  Diet: Tube feeds, dysphagia 1 VTE: SCDs Start: 03/02/24 1229  Code: DNR PT/OT recommendations: SNF.  Not medically ready for discharge.  Jose Ngo, MD Internal Medicine Resident,  PGY-1 Please contact the on call pager at [phone] for any urgent or emergent needs. 2:46 PM 04/03/2024

## 2024-04-04 ENCOUNTER — Inpatient Hospital Stay (HOSPITAL_COMMUNITY)

## 2024-04-04 LAB — GLUCOSE, CAPILLARY
Glucose-Capillary: 130 mg/dL — ABNORMAL HIGH (ref 70–99)
Glucose-Capillary: 154 mg/dL — ABNORMAL HIGH (ref 70–99)
Glucose-Capillary: 221 mg/dL — ABNORMAL HIGH (ref 70–99)
Glucose-Capillary: 232 mg/dL — ABNORMAL HIGH (ref 70–99)
Glucose-Capillary: 263 mg/dL — ABNORMAL HIGH (ref 70–99)
Glucose-Capillary: 69 mg/dL — ABNORMAL LOW (ref 70–99)
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
## 2024-04-04 NOTE — Plan of Care (Signed)
  Problem: Nutrition: Goal: Adequate nutrition will be maintained Outcome: Progressing   Problem: Coping: Goal: Level of anxiety will decrease Outcome: Progressing   Problem: Elimination: Goal: Will not experience complications related to bowel motility Outcome: Progressing   Problem: Pain Managment: Goal: General experience of comfort will improve and/or be controlled Outcome: Progressing   Problem: Safety: Goal: Ability to remain free from injury will improve Outcome: Progressing   Problem: Skin Integrity: Goal: Risk for impaired skin integrity will decrease Outcome: Progressing
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
## 2024-04-05 DIAGNOSIS — I6200 Nontraumatic subdural hemorrhage, unspecified: Secondary | ICD-10-CM | POA: Diagnosis not present

## 2024-04-05 DIAGNOSIS — R69 Illness, unspecified: Secondary | ICD-10-CM | POA: Diagnosis not present

## 2024-04-05 DIAGNOSIS — R561 Post traumatic seizures: Secondary | ICD-10-CM | POA: Diagnosis not present

## 2024-04-05 LAB — COMPREHENSIVE METABOLIC PANEL WITH GFR
ALT: 22 U/L (ref 0–44)
AST: 20 U/L (ref 15–41)
Albumin: 3 g/dL — ABNORMAL LOW (ref 3.5–5.0)
Alkaline Phosphatase: 81 U/L (ref 38–126)
Anion gap: 9 (ref 5–15)
BUN: 28 mg/dL — ABNORMAL HIGH (ref 6–20)
CO2: 26 mmol/L (ref 22–32)
Calcium: 9.3 mg/dL (ref 8.9–10.3)
Chloride: 103 mmol/L (ref 98–111)
Creatinine, Ser: 0.77 mg/dL (ref 0.44–1.00)
GFR, Estimated: >60 mL/min (ref >60)
Glucose, Bld: 104 mg/dL — ABNORMAL HIGH (ref 70–99)
Potassium: 3.9 mmol/L (ref 3.5–5.1)
Sodium: 138 mmol/L (ref 135–145)
Total Bilirubin: 0.7 mg/dL (ref 0.0–1.2)
Total Protein: 7.7 g/dL (ref 6.5–8.1)

## 2024-04-05 LAB — GLUCOSE, CAPILLARY
Glucose-Capillary: 139 mg/dL — ABNORMAL HIGH (ref 70–99)
Glucose-Capillary: 173 mg/dL — ABNORMAL HIGH (ref 70–99)
Glucose-Capillary: 193 mg/dL — ABNORMAL HIGH (ref 70–99)
Glucose-Capillary: 200 mg/dL — ABNORMAL HIGH (ref 70–99)
Glucose-Capillary: 221 mg/dL — ABNORMAL HIGH (ref 70–99)
Glucose-Capillary: 92 mg/dL (ref 70–99)

## 2024-04-05 LAB — CBC WITH DIFFERENTIAL/PLATELET
Abs Immature Granulocytes: 0.04 10*3/uL (ref 0.00–0.07)
Basophils Absolute: 0.1 10*3/uL (ref 0.0–0.1)
Basophils Relative: 1 %
Eosinophils Absolute: 0.3 10*3/uL (ref 0.0–0.5)
Eosinophils Relative: 3 %
HCT: 38.4 % (ref 36.0–46.0)
Hemoglobin: 11.9 g/dL — ABNORMAL LOW (ref 12.0–15.0)
Immature Granulocytes: 0 %
Lymphocytes Relative: 33 %
Lymphs Abs: 3.4 10*3/uL (ref 0.7–4.0)
MCH: 27.4 pg (ref 26.0–34.0)
MCHC: 31 g/dL (ref 30.0–36.0)
MCV: 88.3 fL (ref 80.0–100.0)
Monocytes Absolute: 1 10*3/uL (ref 0.1–1.0)
Monocytes Relative: 10 %
Neutro Abs: 5.4 10*3/uL (ref 1.7–7.7)
Neutrophils Relative %: 53 %
Platelets: 218 10*3/uL (ref 150–400)
RBC: 4.35 MIL/uL (ref 3.87–5.11)
RDW: 14.7 % (ref 11.5–15.5)
WBC: 10.2 10*3/uL (ref 4.0–10.5)
nRBC: 0 % (ref 0.0–0.2)

## 2024-04-05 MED ORDER — INSULIN ASPART 100 UNIT/ML IJ SOLN
0.0000 [IU] | INTRAMUSCULAR | Status: AC
  Administered 2024-04-05: 3 [IU] via SUBCUTANEOUS
  Administered 2024-04-05 (×2): 4 [IU] via SUBCUTANEOUS

## 2024-04-05 MED ORDER — INSULIN ASPART 100 UNIT/ML IJ SOLN
8.0000 [IU] | INTRAMUSCULAR | Status: DC
  Administered 2024-04-05 – 2024-04-06 (×3): 8 [IU] via SUBCUTANEOUS

## 2024-04-05 MED ORDER — INSULIN ASPART 100 UNIT/ML IJ SOLN: 0.0000 [IU] | INTRAMUSCULAR | Status: DC

## 2024-04-05 MED ORDER — ENSURE ENLIVE PO LIQD
237.0000 mL | Freq: Two times a day (BID) | ORAL | Status: DC
  Administered 2024-04-05 – 2024-04-13 (×14): 237 mL via ORAL

## 2024-04-05 MED ORDER — INSULIN ASPART 100 UNIT/ML IJ SOLN: 8.0000 [IU] | INTRAMUSCULAR | Status: DC

## 2024-04-05 MED ORDER — INSULIN GLARGINE-YFGN 100 UNIT/ML ~~LOC~~ SOLN
40.0000 [IU] | Freq: Every day | SUBCUTANEOUS | Status: DC
  Administered 2024-04-05 – 2024-04-13 (×9): 40 [IU] via SUBCUTANEOUS
  Filled 2024-04-05 (×9): qty 0.4

## 2024-04-05 NOTE — Progress Notes (Signed)
 Calorie Count Note  48 hour calorie count ordered. See results below and separate RD note for full assessment.   Diet: DYS2, thin Supplements: Nocturnal TF, Glucerna 1.5 @ 70 x 12 hours  Estimated Nutritional Needs:  Kcal:  1800-2000 kcal/d Protein:  95-110g Fluid:  >2L  4/23 Breakfast: 308 kcal, 24g protein 4/23 Lunch: 396 kcal, 32g protein 4/23 Dinner: No ticket available, 40% - per flowsheet unable to calculate kcal and protein 4/24 Breakfast: 131 kcal, 0g protein  Total intake: 835 kcal (46% of minimum estimated needs)  56 protein (59% of minimum estimated needs)  NUTRITION DIAGNOSIS:  Inadequate oral intake related to inability to eat as evidenced by NPO status. Remains applicable   GOAL:  Patient will meet greater than or equal to 90% of their needs Met w/ TF at goal  Intervention:  Continue current diet per SLP, DYS2, thin Encourage PO intake Feeding assistance Snacks BID, 2PM and 8PM Discontinue kcal count, pt consistently not meeting needs. Some improvement seen since last kcal count. Will be more useful to monitor hydration status, functional status, and CBGs off of nutrition as a marker of ability to sustain herself without artificial nutrition.  Recommend discontinue enteral nutrition/cortrak    Edwena Graham, RD, LDN Registered Dietitian II Please reach out via secure chat

## 2024-04-05 NOTE — Progress Notes (Signed)
 Nutrition Follow-up  DOCUMENTATION CODES:  Obesity unspecified  INTERVENTION:  Continue current diet per SLP: DYS2, thin Encourage PO intake Feeding assistance Snacks BID, 2PM and 8PM Discontinue kcal count, pt consistently not meeting needs. Some improvement seen since last kcal count. Will be more useful to monitor hydration status, functional status, and CBGs off of nutrition as a marker of ability to sustain herself without artificial nutrition.  Recommend discontinue enteral nutrition/cortrak Requested new weight Ensure Enlive po BID, each supplement provides 350 kcal and 20 grams of protein.  NUTRITION DIAGNOSIS:  Inadequate oral intake related to inability to eat as evidenced by NPO status. Remains applicable  GOAL:  Patient will meet greater than or equal to 90% of their needs Met w/ TF at goal  MONITOR:  TF tolerance, Labs, Vent status  REASON FOR ASSESSMENT:  Consult Enteral/tube feeding initiation and management  ASSESSMENT:  Pt with hx of prior CVA, seizures, CHF, HTN, T2DM, OSA on CPAP, and GERD. 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 Admitted from home after progressive headaches and vomiting. 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  3/21 admitted to ICU for SDH, on cleviprex  3/24 off cleviprex , transferred out of ICU to IMTS 3/25 seizure activity 3/26 re-admitted to MICU, ventilated 3/27 EEG, lasix  for diuresis 4/2 - pt vomited, TF held but later resumed 4/7 - cortrak placed 4/8- extubated 4/9- SLP recommends continued NPO  4/11 - MBS, DYS 2, honey thick 4/16 - BSE, DYS 1, honey thick 4/21 - adjust to nocturnal feeds 4/20-4/24: kcal count, pt not meeting needs orally  Pt resting in bed at the time of assessment with eyes closed. Did not respond to any questions. Briefly opened her eyes after tactile stimulation to arms and legs but did not sustain alertness. Discussed with RN, reports that this is typical of pt's mentation.  Tends to be more alert first thing in the morning.   Ongoing discussions this week over pt's nutrition and cortrak tube with resident team. Nutrition is last barrier to discharge per MD. Pt has consistently not been meeting her needs orally despite having feeds reduced to nocturnal and total volume of infusion being decreased. This has been documented with multiple calorie counts and and reported by nursing staff. Per SLP notes, pt consistently shows decreased interest in food and poor levels of alertness. Pt requires full supervision with meals and frequent cueing to clear her throat. This is likely all in the setting of her acute brain injury after her TBI with pre-existing deficits from prior stroke. It may take several weeks to months to see full recovery or what pt's new normal will be.   Have expressed concern over pt's ability to take in adequate nutrition and do think that a PEG would be beneficial as pt will be going to SNF after acute stay. Attempted to discuss with pt today but again, pt not appropriate for a conversation regarding her care in this capacity with current mentation.   At this time, would recommend that cortrak be removed to monitor if pt can sustain her own fluid needs and consume enough PO that CBGs remain in an appropriate range. Pt's ability to participate with therapy should also be monitored as a marker of functional status. Removal of cortrak may provide some comfort for pt which may encourage more PO intake. If pt unable to sustain herself, would recommend that plan with long-term feeds and PEG be initiated.   Admit weight: 111.1 kg  Current weight:  76 kg - this is not accurate. Requested new measured weight  Average Meal Intake: 4/11-4/14: 10% average intake x 5 recorded meals 4/15-4/21: 56% intake x 5 recorded meals 4/22-4/24: 37% intake x 5 recorded meals  Nutritionally Relevant Medications: Scheduled Meds:  atorvastatin   40 mg Oral Daily   famotidine   20 mg  Oral BID   (GLUCERNA 1.5 CAL)  1,000 mL Per Tube Q24H   (PROSource TF20)  60 mL Per Tube Daily   free water   100 mL Per Tube Q6H   insulin  aspart  0-20 Units Subcutaneous Q4H   insulin  aspart  8 Units Subcutaneous Q4H   insulin  glargine-yfgn  40 Units Subcutaneous Daily    (JUVEN)  1 packet Oral BID BM   psyllium  1 packet Oral Daily   spironolactone   25 mg Oral Daily   PRN Meds: senna  Labs Reviewed: CBG rages from 105-305 mg/dL over the last 24 hours HgbA1c 11.3% (3/21)  Diet Order:   Diet Order             DIET DYS 2 Room service appropriate? Yes with Assist; Fluid consistency: Thin  Diet effective now                  EDUCATION NEEDS:  Not appropriate for education at this time  Skin:  Skin Assessment: Skin Integrity Issues: (new 4/10) Skin Integrity Issues:: Stage III Stage III: Ischial tuberosity (1.5 x 0.2 x 0.2 cm)  Last BM:  4/24 - type 6  Height:  Ht Readings from Last 1 Encounters:  03/19/24 5\' 9"  (1.753 m)   Weight:  Wt Readings from Last 1 Encounters:  04/05/24 76 kg    Ideal Body Weight:  66 kg  BMI:  Body mass index is 24.74 kg/m.  Estimated Nutritional Needs:  Kcal:  1800-2000 kcal/d Protein:  95-110g Fluid:  >2L   Edwena Graham, RD, LDN Registered Dietitian II Please reach out via secure chat

## 2024-04-05 NOTE — Plan of Care (Signed)
  Problem: Fluid Volume: Goal: Ability to maintain a balanced intake and output will improve Outcome: Progressing   Problem: Health Behavior/Discharge Planning: Goal: Ability to manage health-related needs will improve Outcome: Progressing   Problem: Metabolic: Goal: Ability to maintain appropriate glucose levels will improve Outcome: Progressing   Problem: Nutritional: Goal: Maintenance of adequate nutrition will improve Outcome: Progressing   Problem: Nutritional: Goal: Progress toward achieving an optimal weight will improve Outcome: Progressing   Problem: Skin Integrity: Goal: Risk for impaired skin integrity will decrease Outcome: Progressing

## 2024-04-05 NOTE — Progress Notes (Addendum)
 Used hoyer lift to raise patient to zero bed scale. Bed scale is broken and measures patient's weight inaccurately, even after zeroing. Admitting weight 111.1 kg, currently measured at 78 kg. Requested new bed.

## 2024-04-05 NOTE — Progress Notes (Signed)
 Subjective  She really tried hard yesterday to eat her meals but she is tired of eating the same thing every day.  She feels the consistency may make it taste better but she would like other options.  She had a couple bowel movements overnight, but it seems like overall these have improved.  Denies any pain, she denies difficulty breathing.  Vitals:   04/05/24 0355 04/05/24 0733  BP: (!) 118/44 (!) 152/78  Pulse: 86 85  Resp: 20 18  Temp: 98.2 F (36.8 C) 98.2 F (36.8 C)  SpO2: 97% 96%     Physical Exam: Constitutional: chronically ill-appearing, lying in bed, alert Cardiovascular: regular rate and rhythm, 1+ BLE edema Pulmonary/Chest: Breathing comfortably in bed at room air Abdominal: Nondistended, soft Neurological: Alert and oriented Skin: warm and dry   Intake/Output Summary (Last 24 hours) at 04/05/2024 0951 Last data filed at 04/05/2024 0900 Gross per 24 hour  Intake 1388 ml  Output 0 ml  Net 1388 ml   Net IO Since Admission: 10,903.59 mL [04/05/24 0951]  Labs, images, and other studies    Latest Ref Rng & Units 04/05/2024    6:20 AM 03/31/2024    6:52 AM 03/30/2024    6:04 AM  CBC  WBC 4.0 - 10.5 K/uL 10.2  13.9  13.1   Hemoglobin 12.0 - 15.0 g/dL 32.9  51.8  84.1   Hematocrit 36.0 - 46.0 % 38.4  40.5  39.9   Platelets 150 - 400 K/uL 218  331  336        Latest Ref Rng & Units 04/05/2024    6:20 AM 03/31/2024    6:52 AM 03/30/2024    6:04 AM  BMP  Glucose 70 - 99 mg/dL 660  630  96   BUN 6 - 20 mg/dL 28  26  26    Creatinine 0.44 - 1.00 mg/dL 1.60  1.09  3.23   Sodium 135 - 145 mmol/L 138  135  136   Potassium 3.5 - 5.1 mmol/L 3.9  3.8  4.0   Chloride 98 - 111 mmol/L 103  99  101   CO2 22 - 32 mmol/L 26  27  27    Calcium  8.9 - 10.3 mg/dL 9.3  9.2  9.2      Assessment and plan Hospital day 34  [name] is a 58 y.o.with  significant for prior CVA (2015 with residual left hemiplegia, PAF on Xarelto , seizures (2/2 to prior CVA),  chronic combined HF, LBBB s/p CRT-D medtronic, PAD, HTN, DMT2, OSA on CPAP, and obesity who was admitted for SDH further complicated by status epilepticus requiring ICU/intubation, MRSA HCAP, and E. coli. Urosepsis.  Neurocognitive Decline following TBI Seizure Disorder 2/2 prior CVA Appreciate SLP.  She is now on dysphagia 2 diet with thin liquids.  Orders put for full supervision and assistance with feeding.  Nursing notified.  She is getting tired of the same food, even if the consistency is changing.  Nursing to assist helping with ordering different food for her.  She ate RDs assistance in figuring out other snacks and other options that we may have so that she can meet her caloric intake by mouth.  Today's day 15th of her cortrack.  Thankfully she denies any erosions or pain in her throat.  She would still benefit from continues PT and OT.  Appreciate PT and OT's help while she is  inpatient.  -Continue with Keppra  500 mg twice daily  -cw lacosamide  200 mg twice daily - PT/OT -continue holding daytime feeds through core track - Continue with free water  - Adding snacks to her meals, I have reached out to RD. -Nursing to assist with feeds, full supervision needed while eating.  Aspiration precautions.  Coated tongue  Has been improving with continued oral care. -Continue with good oral hygiene.  Pressure ulcers- resolving/improving Appreciate wound care, cw wound care per their note -cw air mattress, frequent repositioning  HFrEF (25-30% in 2023) She is currently on BiDil  20-37.5 mg, Lopressor  75 BID, and Aldactone  25 daily. Not on Entresto with hx of angioedema. Not a good candidate for SGLT2-I given recent ESBL UTI.  She is euvolemic at this time with no increased oxygen requirements.  Blood pressure has been slightly lower, but still within normal range. -cw BiDil  20-37.5 mg   HTN BP control as above  HLD Continue home Lipitor  T2DM - Overall has been stable, sometimes running  in the low 100s.  Will increase Semglee  to 40 units daily and decrease mealtime insulin  to 8 units 3 times daily.  Closely monitor as a.m. glucose was in the 90s today.  I do suspect that her glucose will increase that she continues to increase her p.o. intake.  Loose stools Improving.  She only had 2 bowel movements last night.  -Continue with banana flakes.  Cw Metamucil orally as he cannot began through her cortrack.  MRSA Associated HCAP- resolved Completed 7 days of vancomycin  on 4/11 and prior to that completed 7 days of Zosyn .  ESBL UTI- resolved Completed Zosyn  course as above.  No suprapubic tenderness or CVA tenderness on exam.  Diet: Tube feeds, dysphagia 1 VTE: SCDs Start: 03/02/24 1229  Code: DNR PT/OT recommendations: SNF.  Not medically ready for discharge.  Jose Ngo, MD Internal Medicine Resident, PGY-1 Please contact the on call pager at [phone] for any urgent or emergent needs. 9:51 AM 04/05/2024
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
## 2024-04-06 DIAGNOSIS — R69 Illness, unspecified: Secondary | ICD-10-CM | POA: Diagnosis not present

## 2024-04-06 DIAGNOSIS — R561 Post traumatic seizures: Secondary | ICD-10-CM | POA: Diagnosis not present

## 2024-04-06 DIAGNOSIS — I6200 Nontraumatic subdural hemorrhage, unspecified: Secondary | ICD-10-CM | POA: Diagnosis not present

## 2024-04-06 LAB — GLUCOSE, CAPILLARY
Glucose-Capillary: 152 mg/dL — ABNORMAL HIGH (ref 70–99)
Glucose-Capillary: 166 mg/dL — ABNORMAL HIGH (ref 70–99)
Glucose-Capillary: 208 mg/dL — ABNORMAL HIGH (ref 70–99)
Glucose-Capillary: 236 mg/dL — ABNORMAL HIGH (ref 70–99)
Glucose-Capillary: 245 mg/dL — ABNORMAL HIGH (ref 70–99)
Glucose-Capillary: 319 mg/dL — ABNORMAL HIGH (ref 70–99)
Glucose-Capillary: 81 mg/dL (ref 70–99)

## 2024-04-06 MED ORDER — JUVEN PO PACK
1.0000 | PACK | Freq: Two times a day (BID) | ORAL | Status: DC
Start: 2024-04-06 — End: 2024-04-13
  Administered 2024-04-06 – 2024-04-13 (×13): 1 via ORAL
  Filled 2024-04-06 (×13): qty 1

## 2024-04-06 NOTE — Progress Notes (Signed)
 Subjective  She is hungry today and ready to eat.  She would like to go home.  She states that she does have a friend, [name] who can cook for her and helps her out at home.  She would like to eat his food.  She is not sure that her insurance would cover a SNF, but she would like to go home for now.  She is also refusing a PEG tube as she thinks that she has help at home and she would eat the food that she would get at home.  She wants her tube out.  Vitals:   04/06/24 0450 04/06/24 0832  BP: 107/65 127/65  Pulse: 76 74  Resp:  18  Temp: 98.1 F (36.7 C) 98.2 F (36.8 C)  SpO2: 97% 94%     Physical Exam: Constitutional: chronically ill-appearing, lying in bed, alert Cardiovascular: regular rate and rhythm, no BLE edema Pulmonary/Chest: Breathing comfortably in bed at room air Abdominal: Nondistended, soft, non-tender to palpation on the suprapubic region Neurological: Alert and oriented x3 Skin: warm and dry   Intake/Output Summary (Last 24 hours) at 04/06/2024 1112 Last data filed at 04/06/2024 1000 Gross per 24 hour  Intake 480 ml  Output 550 ml  Net -70 ml   Net IO Since Admission: 10,893.59 mL [04/06/24 1112]  Labs, images, and other studies    Latest Ref Rng & Units 04/05/2024    6:20 AM 03/31/2024    6:52 AM 03/30/2024    6:04 AM  CBC  WBC 4.0 - 10.5 K/uL 10.2  13.9  13.1   Hemoglobin 12.0 - 15.0 g/dL 40.9  81.1  91.4   Hematocrit 36.0 - 46.0 % 38.4  40.5  39.9   Platelets 150 - 400 K/uL 218  331  336        Latest Ref Rng & Units 04/05/2024    6:20 AM 03/31/2024    6:52 AM 03/30/2024    6:04 AM  BMP  Glucose 70 - 99 mg/dL 782  956  96   BUN 6 - 20 mg/dL 28  26  26    Creatinine 0.44 - 1.00 mg/dL 2.13  0.86  5.78   Sodium 135 - 145 mmol/L 138  135  136   Potassium 3.5 - 5.1 mmol/L 3.9  3.8  4.0   Chloride 98 - 111 mmol/L 103  99  101   CO2 22 - 32 mmol/L 26  27  27    Calcium  8.9 - 10.3 mg/dL 9.3  9.2  9.2      Assessment and plan Hospital  day 35  [name] is a 58 y.o.with  significant for 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 coli. Urosepsis.  Neurocognitive Decline following TBI Seizure Disorder 2/2 prior CVA Unfortunately she is not meeting her caloric needs by mouth at this time.  Patient is fully alert and oriented x 3.  She is speaking more strongly today.  She says this is the ready to go home.  States that her friend [name], is somebody that has been helping her and prepares food for her.  She would feel good if he prepared food for her.  Her  and her brother are not that close, and he has not made much decisions for her in the past.  Attempted to call her legal guardian [name] to update her today but unfortunately was unable to reach her.   I do think at this time it may be best to discontinue her cortrack and see if she would eat more.  P.o. trials while in the core track have been unfortunately unsuccessful.  We discussed the option of placing a PEG tube today with her, but she refused again.  [name] does understand the risks of not getting a PEG tube. She feels that she would be able to eat if she was at home.  I will discuss with APS if [name] is allowed to visit her and bring her food.  We also did recommend SNF for her given her deconditioning.  She states that she got a new insurance and she is not sure if SNF would be covered.  We will touch base with TOC.   -Continue with Keppra  500 mg twice daily  -cw lacosamide  200 mg twice daily - PT/OT - Discontinue cortrack -Continue with snacks, patient's family can also bring food if they are allowed.  It must be finely chopped. -Nursing to assist with feeds, full supervision needed while eating.  Aspiration precautions. -  Follow-up with APS -Reach out to [facility] for SNF options -Appreciate RD   T2DM - Blood glucose was 80 today.  She had not gotten the Semglee  yet, scheduled for later today.  Given that we are discontinuing the core track, I am discontinuing her 8 units of short acting insulin  3 times daily.  We will continue with the increased dose of Semglee  to 40 units daily, which she is getting in the morning.  Home medications include Trulicity  3 mg once weekly, and Lantus  25 mg every day.  We will continue monitoring blood glucose with CBGs and adjust accordingly.  Loose stools I expect this to improve as she starts eating more solid foods. -Cw Metamucil.   Coated tongue  Has been improving with continued oral care. -Continue with good oral hygiene.  Pressure ulcers- resolving/improving Appreciate wound care, will need referral to wound care OP  -cw air mattress, frequent repositioning  HFrEF (25-30% in 2023) She is currently on BiDil  20-37.5 mg, Lopressor  75 BID, and Aldactone  25 daily. Not on Entresto with hx of angioedema. Not a good candidate for SGLT2-I given recent ESBL UTI.  She is euvolemic at this time with no increased oxygen requirements.  Blood pressure has been slightly lower, but still within normal range. -cw BiDil  20-37.5 mg   HTN BP control as above  HLD Continue home Lipitor  MRSA Associated HCAP- resolved Completed 7 days of vancomycin  on 4/11 and prior to that completed 7 days of Zosyn .  ESBL UTI- resolved Completed Zosyn  course as above.  No suprapubic tenderness or CVA tenderness on exam.  She does report dysuria, which we will continue to monitor.  Could be because of her prior infection.  She is status post Zosyn  for 7 days.  Diet: Tube feeds, dysphagia 1 VTE: SCDs Start: 03/02/24 1229  Code: DNR PT/OT recommendations: SNF.  Not medically ready for discharge.  Jose Ngo, MD Internal Medicine Resident, PGY-1 Please contact the on call pager at  [phone] for any urgent or emergent needs. 11:12 AM 04/06/2024

## 2024-04-06 NOTE — TOC Progression Note (Signed)
 Transition of Care [facility] - Progression Note    Patient Details  Name: [name] MRN: [id number] Date of Birth: 15-Jul-1966  Transition of Care [facility]) CM/SW Contact  Elspeth Hals, LCSW Phone Number: 04/06/2024, 4:01 PM  Clinical Narrative:   3 bed offers provided to pt to review on medicare choice document.     Expected Discharge Plan: Skilled Nursing Facility Barriers to Discharge: Continued Medical Work up, SNF Pending bed offer  Expected Discharge Plan and Services In-house Referral: Clinical Social Work   Post Acute Care Choice: Skilled Nursing Facility Living arrangements for the past 2 months: Apartment                                       Social Determinants of Health (SDOH) Interventions SDOH Screenings   Food Insecurity: No Food Insecurity (03/02/2024)  Housing: High Risk (03/02/2024)  Transportation Needs: Unmet Transportation Needs (03/02/2024)  Utilities: At Risk (03/02/2024)  Alcohol  Screen: Low Risk  (03/23/2023)  Depression (PHQ2-9): High Risk (06/02/2023)  Financial Resource Strain: Medium Risk (03/23/2023)  Physical Activity: Insufficiently Active (03/23/2023)  Social Connections: Moderately Isolated (03/02/2024)  Tobacco Use: Medium Risk (03/02/2024)    Readmission Risk Interventions    03/11/2023    3:14 PM  Readmission Risk Prevention Plan  Transportation Screening Complete  PCP or Specialist Appt within 5-7 Days Complete  Home Care Screening Complete  Medication Review (RN CM) Referral to Pharmacy

## 2024-04-06 NOTE — Plan of Care (Signed)
  Problem: Health Behavior/Discharge Planning: Goal: Ability to manage health-related needs will improve Outcome: Progressing   Problem: Metabolic: Goal: Ability to maintain appropriate glucose levels will improve Outcome: Progressing   Problem: Nutritional: Goal: Maintenance of adequate nutrition will improve Outcome: Progressing   Problem: Skin Integrity: Goal: Risk for impaired skin integrity will decrease Outcome: Progressing   Problem: Education: Goal: Knowledge of General Education information will improve Description: Including pain rating scale, medication(s)/side effects and non-pharmacologic comfort measures Outcome: Progressing   Problem: Health Behavior/Discharge Planning: Goal: Ability to manage health-related needs will improve Outcome: Progressing   Problem: Clinical Measurements: Goal: Ability to maintain clinical measurements within normal limits will improve Outcome: Progressing Goal: Will remain free from infection Outcome: Progressing   Problem: Activity: Goal: Risk for activity intolerance will decrease Outcome: Progressing   Problem: Nutrition: Goal: Adequate nutrition will be maintained Outcome: Progressing   Problem: Coping: Goal: Level of anxiety will decrease Outcome: Progressing

## 2024-04-07 LAB — GLUCOSE, CAPILLARY
Glucose-Capillary: 225 mg/dL — ABNORMAL HIGH (ref 70–99)
Glucose-Capillary: 153 mg/dL — ABNORMAL HIGH (ref 70–99)
Glucose-Capillary: 162 mg/dL — ABNORMAL HIGH (ref 70–99)
Glucose-Capillary: 172 mg/dL — ABNORMAL HIGH (ref 70–99)
Glucose-Capillary: 169 mg/dL — ABNORMAL HIGH (ref 70–99)

## 2024-04-07 NOTE — Progress Notes (Signed)
 HD#36 SUBJECTIVE:  Patient Summary: [name] is a 58 y.o. with a pertinent PMH of prior CVA with residual left sided hemiplegia, PAF on xarelto , seizures, chronic combined CHF, LBBB s/p CRT-D medtronic, PAD, HTN, T2DM, OSA on CPAP, and obesity, who was admitted with a SDH c/b status epilepticus requiring an ICU stay/intubation, now extubated, but with continued neurocognitive decline following TBI.   Overnight Events: No acute events overnight  Interim History: Patient seen at the bedside this morning. She has still not been eating much, but is happy that her Cortrak was removed. She is more agreeable to short-term SNF placement today, but needs to discuss her options with social work.   OBJECTIVE:  Vital Signs: Vitals:   04/06/24 1639 04/06/24 2054 04/06/24 2338 04/07/24 0342  BP: 120/71 (!) 113/55 (!) 101/55 (!) 106/59  Pulse: 81 81 80 74  Resp: 16 18 16 18   Temp: 98.1 F (36.7 C) 97.7 F (36.5 C) 98.4 F (36.9 C) 98.6 F (37 C)  TempSrc: Oral Oral Oral Axillary  SpO2: 98% 97% 97% 98%  Weight:      Height:       Supplemental O2: Room Air SpO2: 98 % O2 Flow Rate (L/min): 2 L/min FiO2 (%): 40 %  Filed Weights   04/05/24 0400 04/06/24 0500 04/06/24 1015  Weight: 76 kg 79 kg 110.2 kg     Intake/Output Summary (Last 24 hours) at 04/07/2024 0614 Last data filed at 04/06/2024 1345 Gross per 24 hour  Intake 720 ml  Output 200 ml  Net 520 ml   Net IO Since Admission: 10,933.59 mL [04/07/24 0614]  Physical Exam: General: Pleasant, chronically il-appearing female laying in bed. No acute distress. CV: RRR. no LE edema Pulmonary: Normal work of breathing on room air Neuro: Alert and oriented to person, place, and time Psych: Normal mood and affect     ASSESSMENT/PLAN:  Assessment: Principal Problem:   Traumatic brain injury (HCC) Active Problems:   Benign essential HTN   Chronic combined systolic and diastolic heart failure (HCC)   Hemiparesis affecting  left side as late effect of cerebrovascular accident [facility])   Poorly controlled type 2 diabetes mellitus with neuropathy (HCC)   Atrial fibrillation (HCC)   Type 2 diabetes mellitus with hyperglycemia, with long-term current use of insulin  (HCC)   Subdural hematoma (HCC)   Seizures, post-traumatic (HCC)   Status epilepticus (HCC), resolved   Pressure injury of buttock, stage 2 (HCC)   Pressure injury of coccygeal region, stage 2 (HCC)   On tube feeding diet   Decreased level of consciousness   Seizure disorder as sequela of cerebrovascular accident [facility])   Post traumatic seizure disorder (HCC)   Severe comorbid illness   Subdural hemorrhage (HCC)   Plan: Neurocognitive decline following TBI Seizure disorder 2/2 prior CVA Protein calorie malnutrition The patient had her Cortrak removed yesterday and was adamant about not wanting to trial a short-term PEG tube, although it was our recommendation given her poor PO intake. Given her extended and complicated hospital stay, it has been recommended she go to SNF, which she is considering. At this point, the patient is medically stable to discharge to SNF.   - Continue keppra  500 mg BID - Continue vimpat  200 mg BID - Continue feeding supplements - Pending SNF choice and insurance authorization   Type 2 diabetes CBGs in the 200s over the last 24 hrs - novolog  was discontinued yesterday in the setting of tube feeds being stopped.  -  Continue Semglee  40u daily  - CBGs q4h     Best Practice: Diet: DYS 2 VTE: SCDs Start: 03/02/24 1229, Xarelto   Code: DNR AB: None Therapy Recs: SNF DISPO: Anticipated discharge to Skilled nursing facility pending  Placement .  Signature: Kare Dado, D.O.  Internal Medicine Resident, PGY-3 [facility]  Pager: [phone] 6:14 AM, 04/07/2024   Please contact the on call pager after 5 pm and on weekends at [phone].

## 2024-04-07 NOTE — Plan of Care (Signed)
  Problem: Clinical Measurements: Goal: Diagnostic test results will improve Outcome: Progressing   Problem: Elimination: Goal: Will not experience complications related to bowel motility Outcome: Progressing   Problem: Safety: Goal: Ability to remain free from injury will improve Outcome: Progressing   

## 2024-04-08 DIAGNOSIS — Z7901 Long term (current) use of anticoagulants: Secondary | ICD-10-CM

## 2024-04-08 DIAGNOSIS — I6200 Nontraumatic subdural hemorrhage, unspecified: Secondary | ICD-10-CM | POA: Diagnosis not present

## 2024-04-08 DIAGNOSIS — R561 Post traumatic seizures: Secondary | ICD-10-CM | POA: Diagnosis not present

## 2024-04-08 LAB — GLUCOSE, CAPILLARY
Glucose-Capillary: 129 mg/dL — ABNORMAL HIGH (ref 70–99)
Glucose-Capillary: 146 mg/dL — ABNORMAL HIGH (ref 70–99)
Glucose-Capillary: 151 mg/dL — ABNORMAL HIGH (ref 70–99)
Glucose-Capillary: 167 mg/dL — ABNORMAL HIGH (ref 70–99)
Glucose-Capillary: 191 mg/dL — ABNORMAL HIGH (ref 70–99)
Glucose-Capillary: 300 mg/dL — ABNORMAL HIGH (ref 70–99)

## 2024-04-08 NOTE — Plan of Care (Signed)
  Problem: Nutritional: Goal: Maintenance of adequate nutrition will improve Outcome: Progressing   Problem: Skin Integrity: Goal: Risk for impaired skin integrity will decrease Outcome: Progressing   Problem: Activity: Goal: Risk for activity intolerance will decrease Outcome: Progressing

## 2024-04-08 NOTE — Progress Notes (Signed)
 Notified MD of patients CBG of 300. No new orders at this time.

## 2024-04-08 NOTE — Progress Notes (Signed)
 HD#37 SUBJECTIVE:  Patient Summary: [name] is a 58 y.o. with a pertinent PMH of prior CVA with residual left sided hemiplegia, PAF on xarelto , seizures, chronic combined CHF, LBBB s/p CRT-D medtronic, PAD, HTN, T2DM, OSA on CPAP, and obesity, who was admitted with a SDH c/b status epilepticus requiring an ICU stay/intubation, now extubated, but with continued neurocognitive decline following TBI.   Overnight Events: No acute events overnight  Interim History: Seen at the bedside this morning. She states that she thinks she is eating more, but has not had breakfast yet since no one has come to help feed her.   OBJECTIVE:  Vital Signs: Vitals:   04/07/24 1955 04/07/24 2155 04/08/24 0049 04/08/24 0356  BP: 111/69 111/69 119/82 129/86  Pulse: 94 94 81 85  Resp: 18  16 16   Temp: 98 F (36.7 C)  98.2 F (36.8 C) 97.8 F (36.6 C)  TempSrc: Oral  Oral Oral  SpO2: 99%  96% 99%  Weight:      Height:       Supplemental O2: Room Air SpO2: 99 %  Filed Weights   04/05/24 0400 04/06/24 0500 04/06/24 1015  Weight: 76 kg 79 kg 110.2 kg     Intake/Output Summary (Last 24 hours) at 04/08/2024 0619 Last data filed at 04/07/2024 1830 Gross per 24 hour  Intake 240 ml  Output 400 ml  Net -160 ml   Net IO Since Admission: 10,773.59 mL [04/08/24 0619]  Physical Exam: General: Chronically ill appearing female laying in bed. No acute distress Pulmonary: Normal work of breathing on room air  Skin: Warm and dry.  Neuro: A&Ox3 Psych: Normal mood and affect    ASSESSMENT/PLAN:  Assessment: Principal Problem:   Traumatic brain injury [facility]) Active Problems:   Benign essential HTN   Chronic combined systolic and diastolic heart failure (HCC)   Hemiparesis affecting left side as late effect of cerebrovascular accident [facility])   Poorly controlled type 2 diabetes mellitus with neuropathy (HCC)   Atrial fibrillation (HCC)   Type 2 diabetes mellitus with hyperglycemia, with long-term  current use of insulin  (HCC)   Subdural hematoma (HCC)   Seizures, post-traumatic (HCC)   Status epilepticus (HCC), resolved   Pressure injury of buttock, stage 2 (HCC)   Pressure injury of coccygeal region, stage 2 (HCC)   On tube feeding diet   Decreased level of consciousness   Seizure disorder as sequela of cerebrovascular accident [facility])   Post traumatic seizure disorder (HCC)   Severe comorbid illness   Subdural hemorrhage (HCC)   Plan: Neurocognitive decline following TBI Seizure disorder 2/2 prior CVA Protein calorie malnutrition The patient had her Cortrak removed 4/25 and was adamant about not wanting to trial a short-term PEG tube, although it was our recommendation given her poor PO intake. Given her extended and complicated hospital stay, it has been recommended she go to SNF, which she is considering. At this point, the patient is medically stable to discharge to SNF.   - Continue keppra  500 mg BID - Continue vimpat  200 mg BID - Continue feeding supplements - Pending SNF choice and insurance authorization    Type 2 diabetes CBGs 146-169 over the last 24 hrs. - Continue Semglee  40u daily  - CBGs q4h     Best Practice: Diet: DYS 2 IVF: Fluids: none VTE: SCDs Start: 03/02/24 1229 Code: DNR AB: None Therapy Recs: SNF DISPO: Anticipated discharge to Skilled nursing facility pending  Placement .  Signature: Carlitos Bottino, D.O.  Internal Medicine Resident, PGY-3 [facility]  Pager: [phone] 6:19 AM, 04/08/2024   Please contact the on call pager after 5 pm and on weekends at [phone].

## 2024-04-09 DIAGNOSIS — Z7901 Long term (current) use of anticoagulants: Secondary | ICD-10-CM | POA: Diagnosis not present

## 2024-04-09 DIAGNOSIS — R561 Post traumatic seizures: Secondary | ICD-10-CM | POA: Diagnosis not present

## 2024-04-09 DIAGNOSIS — R69 Illness, unspecified: Secondary | ICD-10-CM | POA: Diagnosis not present

## 2024-04-09 DIAGNOSIS — I6200 Nontraumatic subdural hemorrhage, unspecified: Secondary | ICD-10-CM | POA: Diagnosis not present

## 2024-04-09 LAB — GLUCOSE, CAPILLARY
Glucose-Capillary: 110 mg/dL — ABNORMAL HIGH (ref 70–99)
Glucose-Capillary: 114 mg/dL — ABNORMAL HIGH (ref 70–99)
Glucose-Capillary: 144 mg/dL — ABNORMAL HIGH (ref 70–99)
Glucose-Capillary: 166 mg/dL — ABNORMAL HIGH (ref 70–99)
Glucose-Capillary: 198 mg/dL — ABNORMAL HIGH (ref 70–99)
Glucose-Capillary: 238 mg/dL — ABNORMAL HIGH (ref 70–99)

## 2024-04-09 LAB — BASIC METABOLIC PANEL WITH GFR
Anion gap: 10 (ref 5–15)
BUN: 16 mg/dL (ref 6–20)
CO2: 25 mmol/L (ref 22–32)
Calcium: 9.4 mg/dL (ref 8.9–10.3)
Chloride: 100 mmol/L (ref 98–111)
Creatinine, Ser: 0.76 mg/dL (ref 0.44–1.00)
GFR, Estimated: >60 mL/min (ref >60)
Glucose, Bld: 166 mg/dL — ABNORMAL HIGH (ref 70–99)
Potassium: 3.8 mmol/L (ref 3.5–5.1)
Sodium: 135 mmol/L (ref 135–145)

## 2024-04-09 LAB — CBC
HCT: 39.6 % (ref 36.0–46.0)
Hemoglobin: 12.4 g/dL (ref 12.0–15.0)
MCH: 27.4 pg (ref 26.0–34.0)
MCHC: 31.3 g/dL (ref 30.0–36.0)
MCV: 87.6 fL (ref 80.0–100.0)
Platelets: 171 10*3/uL (ref 150–400)
RBC: 4.52 MIL/uL (ref 3.87–5.11)
RDW: 14.6 % (ref 11.5–15.5)
WBC: 8.3 10*3/uL (ref 4.0–10.5)
nRBC: 0 % (ref 0.0–0.2)

## 2024-04-09 NOTE — TOC Progression Note (Addendum)
 Transition of Care [facility]) - Progression Note    Patient Details  Name: [name] MRN: [id number] Date of Birth: 1966-10-27  Transition of Care [facility]) CM/SW Contact  Kairav Russomanno A [place], LCSW Phone Number: 04/09/2024, 3:45 PM  Clinical Narrative:     CSW met with pt at bedside and provided update on bed offers. Pt said that she was concerned about losing her apartment if she goes to rehab. Said that she would need to keep her check as well and could not use it to get rehab.  CSW also provided bed offers and pt and friend at bedside, Jearlean Mince said they'd be ok with [facility] then [facility] if bed not available.   CSW reached out to liaison for both placements, waiting to hear back when female bed available. Also confirming that they can use pt's commercial plan versus Medicaid. Requested to start pt's insurance authorization as pt is ready for DC. Authorization started for [facility] has bed is available there.   Update 1617- auth to be started once updated PT note, CSW to follow up in the AM.   TOC will continue to follow.    Expected Discharge Plan: Skilled Nursing Facility Barriers to Discharge: Continued Medical Work up, SNF Pending bed offer  Expected Discharge Plan and Services In-house Referral: Clinical Social Work   Post Acute Care Choice: Skilled Nursing Facility Living arrangements for the past 2 months: Apartment                                       Social Determinants of Health (SDOH) Interventions SDOH Screenings   Food Insecurity: No Food Insecurity (03/02/2024)  Housing: High Risk (03/02/2024)  Transportation Needs: Unmet Transportation Needs (03/02/2024)  Utilities: At Risk (03/02/2024)  Alcohol  Screen: Low Risk  (03/23/2023)  Depression (PHQ2-9): High Risk (06/02/2023)  Financial Resource Strain: Medium Risk (03/23/2023)  Physical Activity: Insufficiently Active (03/23/2023)  Social Connections: Moderately Isolated (03/02/2024)  Tobacco Use:  Medium Risk (03/02/2024)    Readmission Risk Interventions    03/11/2023    3:14 PM  Readmission Risk Prevention Plan  Transportation Screening Complete  PCP or Specialist Appt within 5-7 Days Complete  Home Care Screening Complete  Medication Review (RN CM) Referral to Pharmacy

## 2024-04-09 NOTE — Plan of Care (Signed)
  Problem: Nutritional: Goal: Maintenance of adequate nutrition will improve Outcome: Progressing   Problem: Skin Integrity: Goal: Risk for impaired skin integrity will decrease Outcome: Progressing   Problem: Pain Managment: Goal: General experience of comfort will improve and/or be controlled Outcome: Progressing   Problem: Safety: Goal: Ability to remain free from injury will improve Outcome: Progressing

## 2024-04-09 NOTE — Progress Notes (Signed)
 HD#38 SUBJECTIVE:  Patient Summary: [name] is a 58 y.o. with a pertinent PMH of prior CVA with residual left sided hemiplegia, PAF on xarelto , seizures, chronic combined CHF, LBBB s/p CRT-D medtronic, PAD, HTN, T2DM, OSA on CPAP, and obesity, who was admitted with a SDH c/b status epilepticus requiring an ICU stay/intubation, now extubated, but with continued neurocognitive decline following TBI.   Overnight Events: No acute events overnight  Interim History: Would like to go home, she is worried that she will lose her home if she is not there.  Otherwise she would be okay going on SNF.  She would like her roommate to take care of her.  OBJECTIVE:  Vital Signs: Vitals:   04/08/24 1900 04/08/24 2300 04/09/24 0300 04/09/24 0753  BP: (!) 117/54 (!) 144/79 (!) 146/72 116/70  Pulse: 93 94 73 74  Resp: 16 16 16 17   Temp: 98.2 F (36.8 C) 98.2 F (36.8 C) 98.1 F (36.7 C) 98.1 F (36.7 C)  TempSrc: Axillary Axillary Axillary Oral  SpO2: 96% 98% 97% 96%  Weight:      Height:       Supplemental O2: Room Air SpO2: 99 %  Filed Weights   04/06/24 0500 04/06/24 1015 04/08/24 0500  Weight: 79 kg 110.2 kg 109.8 kg     Intake/Output Summary (Last 24 hours) at 04/09/2024 1110 Last data filed at 04/09/2024 0914 Gross per 24 hour  Intake 5433.83 ml  Output 400 ml  Net 5033.83 ml   Net IO Since Admission: 15,807.42 mL [04/09/24 1110]  Physical Exam: General: Chronically ill appearing female laying in bed. No acute distress Pulmonary: Normal work of breathing on room air Skin: Warm and dry.  Neuro: A&Ox3 Psych: Normal mood and affect    ASSESSMENT/PLAN:  Assessment: Principal Problem:   Traumatic brain injury [facility]) Active Problems:   Benign essential HTN   Chronic combined systolic and diastolic heart failure (HCC)   Hemiparesis affecting left side as late effect of cerebrovascular accident [facility])   Poorly controlled type 2 diabetes mellitus with neuropathy (HCC)    Atrial fibrillation (HCC)   Type 2 diabetes mellitus with hyperglycemia, with long-term current use of insulin  (HCC)   Subdural hematoma (HCC)   Seizures, post-traumatic (HCC)   Status epilepticus (HCC), resolved   Pressure injury of buttock, stage 2 (HCC)   Pressure injury of coccygeal region, stage 2 (HCC)   On tube feeding diet   Decreased level of consciousness   Seizure disorder as sequela of cerebrovascular accident [facility])   Post traumatic seizure disorder (HCC)   Severe comorbid illness   Subdural hemorrhage (HCC)   On continuous oral anticoagulation   Plan: Neurocognitive decline following TBI Seizure disorder 2/2 prior CVA Protein calorie malnutrition Continues to have p.o. of p.o. intake.  Discussed with APS and updated them about removing her cortrack and declining PEG tube.  Spoke with [place] from APS.  She stated it is okay for [name] to come visit her.  Patient would like [name] to bring food.  She is okay to have outside food as long as it is finely chopped.  She is still full assist for feeding.  Attempted to contact [name] over the phone, but have been unsuccessful.  Discussed fear of losing her home with social work.  Social work will address this.  Patient is okay to going to SNF if she is not on a lose her home.  Patient is medically ready for discharge. - Continue keppra  500 mg  BID - Continue vimpat  200 mg BID - Continue feeding supplements - Pending SNF choice and insurance authorization    Type 2 diabetes Given fluctuating amounts of food intake, we will continue to monitor this.  Overall she has been improved with Semglee  40 units daily.  Will continue with the below regimen. - Continue Semglee  40u daily  - CBGs q4h     Best Practice: Diet: DYS 2 IVF: Fluids: none VTE: SCDs Start: 03/02/24 1229 Code: DNR AB: None Therapy Recs: SNF DISPO: Anticipated discharge to Skilled nursing facility pending  Placement .  Signature: Jose Ngo,  MD PGY-1 [facility]  11:10 AM, 04/09/2024   Please contact the on call pager for urgent questions or emergencies at [phone].

## 2024-04-09 NOTE — Progress Notes (Signed)
 NSG 0700-1900: Pt had no interest in eating the hospital food today.  She kept insisting that her caregiver Royston Cornea was going to be bringing in food, however, he has not been in to visit yet.  Pt encouraged to try to eat something.  She did drink a little < 50% of the Juven this morning, but none of the protein shake earlier, and declined the afternoon juven and protein shake.    Pt to chair via maxilift, tolerated well.  She sat up in the chair for just over 2 hours.  Pt returned to bed, repositioned.  Call bell within reach.
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
## 2024-04-09 NOTE — Plan of Care (Signed)
  Problem: Fluid Volume: Goal: Ability to maintain a balanced intake and output will improve Outcome: Progressing   Problem: Health Behavior/Discharge Planning: Goal: Ability to manage health-related needs will improve Outcome: Progressing   Problem: Metabolic: Goal: Ability to maintain appropriate glucose levels will improve Outcome: Progressing   Problem: Nutritional: Goal: Maintenance of adequate nutrition will improve Outcome: Progressing   Problem: Skin Integrity: Goal: Risk for impaired skin integrity will decrease Outcome: Progressing   Problem: Tissue Perfusion: Goal: Adequacy of tissue perfusion will improve Outcome: Progressing   Problem: Education: Goal: Knowledge of General Education information will improve Description: Including pain rating scale, medication(s)/side effects and non-pharmacologic comfort measures Outcome: Progressing   Problem: Clinical Measurements: Goal: Cardiovascular complication will be avoided Outcome: Progressing   Problem: Activity: Goal: Risk for activity intolerance will decrease Outcome: Progressing   Problem: Nutrition: Goal: Adequate nutrition will be maintained Outcome: Progressing   Problem: Coping: Goal: Level of anxiety will decrease Outcome: Progressing   Problem: Skin Integrity: Goal: Risk for impaired skin integrity will decrease Outcome: Progressing

## 2024-04-10 ENCOUNTER — Other Ambulatory Visit: Payer: Self-pay | Admitting: Internal Medicine

## 2024-04-10 DIAGNOSIS — R69 Illness, unspecified: Secondary | ICD-10-CM | POA: Diagnosis not present

## 2024-04-10 DIAGNOSIS — I6200 Nontraumatic subdural hemorrhage, unspecified: Secondary | ICD-10-CM | POA: Diagnosis not present

## 2024-04-10 DIAGNOSIS — I4891 Unspecified atrial fibrillation: Secondary | ICD-10-CM

## 2024-04-10 DIAGNOSIS — R561 Post traumatic seizures: Secondary | ICD-10-CM | POA: Diagnosis not present

## 2024-04-10 LAB — GLUCOSE, CAPILLARY
Glucose-Capillary: 109 mg/dL — ABNORMAL HIGH (ref 70–99)
Glucose-Capillary: 127 mg/dL — ABNORMAL HIGH (ref 70–99)
Glucose-Capillary: 161 mg/dL — ABNORMAL HIGH (ref 70–99)
Glucose-Capillary: 165 mg/dL — ABNORMAL HIGH (ref 70–99)
Glucose-Capillary: 174 mg/dL — ABNORMAL HIGH (ref 70–99)
Glucose-Capillary: 187 mg/dL — ABNORMAL HIGH (ref 70–99)

## 2024-04-10 NOTE — Plan of Care (Signed)
  Problem: Fluid Volume: Goal: Ability to maintain a balanced intake and output will improve Outcome: Progressing   Problem: Metabolic: Goal: Ability to maintain appropriate glucose levels will improve Outcome: Progressing   Problem: Nutritional: Goal: Maintenance of adequate nutrition will improve Outcome: Progressing   Problem: Skin Integrity: Goal: Risk for impaired skin integrity will decrease Outcome: Progressing   Problem: Tissue Perfusion: Goal: Adequacy of tissue perfusion will improve Outcome: Progressing   Problem: Education: Goal: Knowledge of General Education information will improve Description: Including pain rating scale, medication(s)/side effects and non-pharmacologic comfort measures Outcome: Progressing   Problem: Activity: Goal: Risk for activity intolerance will decrease Outcome: Progressing   Problem: Nutrition: Goal: Adequate nutrition will be maintained Outcome: Progressing   Problem: Coping: Goal: Level of anxiety will decrease Outcome: Progressing   Problem: Safety: Goal: Ability to remain free from injury will improve Outcome: Progressing   Problem: Skin Integrity: Goal: Risk for impaired skin integrity will decrease Outcome: Progressing   Problem: Clinical Measurements: Goal: Complications related to the disease process, condition or treatment will be avoided or minimized Outcome: Progressing Goal: Diagnostic test results will improve Outcome: Progressing   Problem: Safety: Goal: Verbalization of understanding the information provided will improve Outcome: Progressing

## 2024-04-10 NOTE — TOC Progression Note (Addendum)
 Transition of Care [facility]) - Progression Note    Patient Details  Name: [name] MRN: [id number] Date of Birth: Aug 04, 1966  Transition of Care [facility]) CM/SW Contact  Katrinka Parr, [place] Phone Number: 04/10/2024, 1:35 PM  Clinical Narrative:     Current plan is for pt to go to [facility]. Currently awaiting updated PT note so [facility] can [employer].   CSW is notified by RN that pt wants to speak with TOC regarding her desire to go home instead of SNF. Pt is noted to have a legal guardian. CSW called guardian, Alycia Babcock,  and updated her on current plan for [facility]. CSW did update her on pt's desire to go home. Shelley Dew confirmed that DSS currently has temporary guardianship. Shelley Dew is going to discuss with her supervisor if pt would be able to make decision to go home or not.  Shelley Dew to email CSW guardianship paperwork. Will continue to plan for SNF for now.   1415:  CSW received Interim Guardian paperwork. Paperwork has been uploaded to chart in Epic.  Expected Discharge Plan: Skilled Nursing Facility Barriers to Discharge: Insurance Authorization  Expected Discharge Plan and Services In-house Referral: Clinical Social Work   Post Acute Care Choice: Skilled Nursing Facility Living arrangements for the past 2 months: Apartment                                       Social Determinants of Health (SDOH) Interventions SDOH Screenings   Food Insecurity: No Food Insecurity (03/02/2024)  Housing: High Risk (03/02/2024)  Transportation Needs: Unmet Transportation Needs (03/02/2024)  Utilities: At Risk (03/02/2024)  Alcohol  Screen: Low Risk  (03/23/2023)  Depression (PHQ2-9): High Risk (06/02/2023)  Financial Resource Strain: Medium Risk (03/23/2023)  Physical Activity: Insufficiently Active (03/23/2023)  Social Connections: Moderately Isolated (03/02/2024)  Tobacco Use: Medium Risk (03/02/2024)    Readmission Risk Interventions     03/11/2023    3:14 PM  Readmission Risk Prevention Plan  Transportation Screening Complete  PCP or Specialist Appt within 5-7 Days Complete  Home Care Screening Complete  Medication Review (RN CM) Referral to Pharmacy

## 2024-04-10 NOTE — Plan of Care (Signed)
  Problem: Metabolic: Goal: Ability to maintain appropriate glucose levels will improve Outcome: Progressing   Problem: Nutritional: Goal: Maintenance of adequate nutrition will improve Outcome: Progressing Goal: Progress toward achieving an optimal weight will improve Outcome: Progressing   Problem: Skin Integrity: Goal: Risk for impaired skin integrity will decrease Outcome: Progressing   Problem: Clinical Measurements: Goal: Ability to maintain clinical measurements within normal limits will improve Outcome: Progressing Goal: Will remain free from infection Outcome: Progressing Goal: Diagnostic test results will improve Outcome: Progressing Goal: Respiratory complications will improve Outcome: Progressing Goal: Cardiovascular complication will be avoided Outcome: Progressing
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
## 2024-04-10 NOTE — Progress Notes (Signed)
 HD#39 SUBJECTIVE:  Patient Summary: [name] is a 58 y.o. with a pertinent PMH of prior CVA with residual left sided hemiplegia, PAF on xarelto , seizures, chronic combined CHF, LBBB s/p CRT-D medtronic, PAD, HTN, T2DM, OSA on CPAP, and obesity, who was admitted with a SDH c/b status epilepticus requiring an ICU stay/intubation, now extubated, but with continued neurocognitive decline following TBI.   Overnight Events: No acute events overnight  Interim History: Royston Cornea brought her chicken, she felt hungry but once she got it she did not feel like eating it. This is unlike her. Got news that Royston Cornea will be out of work for one month but then will need to look for more work. Chose two beds for SNF.   OBJECTIVE:  Vital Signs: Vitals:   04/09/24 1954 04/10/24 0014 04/10/24 0300 04/10/24 0742  BP: (!) 142/81 115/73 (!) 153/82 112/72  Pulse: 85 92 81 81  Resp: 18 18 18 18   Temp: 98.6 F (37 C) 98.4 F (36.9 C) 98.4 F (36.9 C) 98 F (36.7 C)  TempSrc: Axillary Axillary Axillary Oral  SpO2: 96% 94% 98% 98%  Weight:      Height:       Supplemental O2: Room Air SpO2: 99 %  Filed Weights   04/06/24 0500 04/06/24 1015 04/08/24 0500  Weight: 79 kg 110.2 kg 109.8 kg     Intake/Output Summary (Last 24 hours) at 04/10/2024 0945 Last data filed at 04/09/2024 1818 Gross per 24 hour  Intake 30 ml  Output --  Net 30 ml   Net IO Since Admission: 15,837.42 mL [04/10/24 0945]  Physical Exam: General: Chronically ill appearing female laying in bed. No acute distress Pulmonary: Normal work of breathing on room air Heart: rrr, no mrg, no BLE edema Skin: Warm and dry.  Neuro: A&Ox3 Psych: Normal mood and affect  ASSESSMENT/PLAN:  Assessment: Principal Problem:   Traumatic brain injury [facility]) Active Problems:   Benign essential HTN   Chronic combined systolic and diastolic heart failure (HCC)   Hemiparesis affecting left side as late effect of cerebrovascular accident [facility])    Poorly controlled type 2 diabetes mellitus with neuropathy (HCC)   Atrial fibrillation (HCC)   Type 2 diabetes mellitus with hyperglycemia, with long-term current use of insulin  (HCC)   Subdural hematoma (HCC)   Seizures, post-traumatic (HCC)   Status epilepticus (HCC), resolved   Pressure injury of buttock, stage 2 (HCC)   Pressure injury of coccygeal region, stage 2 (HCC)   On tube feeding diet   Decreased level of consciousness   Seizure disorder as sequela of cerebrovascular accident [facility])   Post traumatic seizure disorder (HCC)   Severe comorbid illness   Subdural hemorrhage (HCC)   On continuous oral anticoagulation   Plan: Neurocognitive decline following TBI Seizure disorder 2/2 prior CVA Protein calorie malnutrition Still not meeting nutritional requirements but eating and drinking. Stable for discharge, pending PT and SNF auth.  - Continue keppra  500 mg BID - Continue vimpat  200 mg BID - Continue feeding supplements - Pt to eval today   Type 2 diabetes Given fluctuating amounts of food intake. Glucose stable this am at 127. Cw semglee  40 daily - Continue Semglee  40u daily  - CBGs q4h   HFrEF (25-30% in 2023) She is currently on BiDil  20-37.5 mg, Lopressor  75 BID, and Aldactone  25 daily. Not on Entresto with hx of angioedema. Not a good candidate for SGLT2-I given recent ESBL UTI.  She is euvolemic at this time with  no increased oxygen requirements.  -cw BiDil  20-37.5 mg    HTN BP control as above   HLD Continue home Lipitor  Best Practice: Diet: DYS 2 IVF: Fluids: none VTE: SCDs Start: 03/02/24 1229 Code: DNR AB: None Therapy Recs: SNF DISPO: Anticipated discharge to Skilled nursing facility pending  Placement .  Signature: Jose Ngo, MD PGY-1 [facility]  9:45 AM, 04/10/2024   Please contact the on call pager for urgent questions or emergencies at [phone].

## 2024-04-11 LAB — GLUCOSE, CAPILLARY
Glucose-Capillary: 121 mg/dL — ABNORMAL HIGH (ref 70–99)
Glucose-Capillary: 131 mg/dL — ABNORMAL HIGH (ref 70–99)
Glucose-Capillary: 138 mg/dL — ABNORMAL HIGH (ref 70–99)
Glucose-Capillary: 151 mg/dL — ABNORMAL HIGH (ref 70–99)
Glucose-Capillary: 155 mg/dL — ABNORMAL HIGH (ref 70–99)
Glucose-Capillary: 155 mg/dL — ABNORMAL HIGH (ref 70–99)
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
## 2024-04-11 NOTE — TOC Progression Note (Signed)
 Transition of Care [facility]) - Progression Note    Patient Details  Name: [name] MRN: [id number] Date of Birth: 07-27-1966  [facility] CM/SW Contact  Tandy Fam, [place] Phone Number: 04/11/2024, 11:11 AM  Clinical Narrative:   CSW spoke with Admissions at [facility], authorization remains pending at this time. CSW updated MD with barriers to discharge. CSW to follow.    Expected Discharge Plan: Skilled Nursing Facility Barriers to Discharge: Insurance Authorization  Expected Discharge Plan and Services In-house Referral: Clinical Social Work   Post Acute Care Choice: Skilled Nursing Facility Living arrangements for the past 2 months: Apartment                                       Social Determinants of Health (SDOH) Interventions SDOH Screenings   Food Insecurity: No Food Insecurity (03/02/2024)  Housing: High Risk (03/02/2024)  Transportation Needs: Unmet Transportation Needs (03/02/2024)  Utilities: At Risk (03/02/2024)  Alcohol  Screen: Low Risk  (03/23/2023)  Depression (PHQ2-9): High Risk (06/02/2023)  Financial Resource Strain: Medium Risk (03/23/2023)  Physical Activity: Insufficiently Active (03/23/2023)  Social Connections: Moderately Isolated (03/02/2024)  Tobacco Use: Medium Risk (03/02/2024)    Readmission Risk Interventions    03/11/2023    3:14 PM  Readmission Risk Prevention Plan  Transportation Screening Complete  PCP or Specialist Appt within 5-7 Days Complete  Home Care Screening Complete  Medication Review (RN CM) Referral to Pharmacy

## 2024-04-11 NOTE — Telephone Encounter (Signed)
 Patient currently admitted - will send in rx if appropriate at discharge

## 2024-04-12 LAB — GLUCOSE, CAPILLARY
Glucose-Capillary: 135 mg/dL — ABNORMAL HIGH (ref 70–99)
Glucose-Capillary: 138 mg/dL — ABNORMAL HIGH (ref 70–99)
Glucose-Capillary: 141 mg/dL — ABNORMAL HIGH (ref 70–99)
Glucose-Capillary: 148 mg/dL — ABNORMAL HIGH (ref 70–99)
Glucose-Capillary: 153 mg/dL — ABNORMAL HIGH (ref 70–99)
Glucose-Capillary: 154 mg/dL — ABNORMAL HIGH (ref 70–99)
Glucose-Capillary: 180 mg/dL — ABNORMAL HIGH (ref 70–99)

## 2024-04-12 NOTE — Progress Notes (Signed)
 HD#41 SUBJECTIVE:  Patient Summary: [name] is a 58 y.o. with a pertinent PMH of prior CVA with residual left sided hemiplegia, PAF on xarelto , seizures, chronic combined CHF, LBBB s/p CRT-D medtronic, PAD, HTN, T2DM, OSA on CPAP, and obesity, who was admitted with a SDH c/b status epilepticus requiring an ICU stay/intubation, now extubated, but with continued neurocognitive decline following TBI.   Overnight Events: No acute events overnight  Interim History: Is doing about the same as yesterday.  Royston Cornea came to visit her and she ate some greens.  She ate about 10% of her breakfast and about 20% of her lunch.  She denies any pain or shortness of breath at this time.  OBJECTIVE:  Vital Signs: Vitals:   04/12/24 0407 04/12/24 0500 04/12/24 0756 04/12/24 1122  BP: 135/84  120/75 (!) 146/86  Pulse: 81  88 86  Resp:   19 18  Temp: 98.1 F (36.7 C)  98.6 F (37 C) 97.6 F (36.4 C)  TempSrc: Oral  Oral Oral  SpO2: 96%  93% 100%  Weight:  110.7 kg    Height:       Supplemental O2: Room Air SpO2: 99 %  Filed Weights   04/08/24 0500 04/11/24 0500 04/12/24 0500  Weight: 109.8 kg 111.1 kg 110.7 kg     Intake/Output Summary (Last 24 hours) at 04/12/2024 1143 Last data filed at 04/12/2024 0600 Gross per 24 hour  Intake 300 ml  Output 400 ml  Net -100 ml   Net IO Since Admission: 15,577.42 mL [04/12/24 1143]  Physical Exam: General: Chronically ill appearing female laying in bed. No acute distress Pulmonary: Normal work of breathing on room air Heart: rrr, no mrg, no BLE edema Skin: Warm and dry.  Neuro: A&Ox3 Psych: Normal mood and affect  ASSESSMENT/PLAN:  Assessment: Principal Problem:   Traumatic brain injury [facility]) Active Problems:   Benign essential HTN   Chronic combined systolic and diastolic heart failure (HCC)   Hemiparesis affecting left side as late effect of cerebrovascular accident [facility])   Poorly controlled type 2 diabetes mellitus with neuropathy  (HCC)   Atrial fibrillation (HCC)   Type 2 diabetes mellitus with hyperglycemia, with long-term current use of insulin  (HCC)   Subdural hematoma (HCC)   Seizures, post-traumatic (HCC)   Status epilepticus (HCC), resolved   Pressure injury of buttock, stage 2 (HCC)   Pressure injury of coccygeal region, stage 2 (HCC)   On tube feeding diet   Decreased level of consciousness   Seizure disorder as sequela of cerebrovascular accident [facility])   Post traumatic seizure disorder (HCC)   Severe comorbid illness   Subdural hemorrhage (HCC)   On continuous oral anticoagulation   Plan: Neurocognitive decline following TBI Seizure disorder 2/2 prior CVA Protein calorie malnutrition Stable from yesterday. She will continue to work with PT and OT.  SNF pending approval.  - Continue keppra  500 mg BID - Continue vimpat  200 mg BID - Continue feeding supplements   Type 2 diabetes Given fluctuating amounts of food intake. Glucose stable. Cw semglee  40 daily - Continue Semglee  40u daily  - CBGs q4h   HFrEF (25-30% in 2023) She is currently on BiDil  20-37.5 mg, Lopressor  75 BID, and Aldactone  25 daily. Not on Entresto with hx of angioedema. Not a good candidate for SGLT2-I given recent ESBL UTI.  She is euvolemic at this time with no increased oxygen requirements.  -cw BiDil  20-37.5 mg    HTN BP control as above  HLD Continue home Lipitor  Best Practice: Diet: DYS 2 IVF: Fluids: none VTE: SCDs Start: 03/02/24 1229 Code: DNR AB: None Therapy Recs: SNF DISPO: Anticipated discharge to Skilled nursing facility pending  Placement .  Signature: Jose Ngo, MD PGY-1 [facility]  11:43 AM, 04/12/2024   Please contact the on call pager for urgent questions or emergencies at [phone].

## 2024-04-12 NOTE — Progress Notes (Signed)
 Nutrition Follow-up  DOCUMENTATION CODES:  Obesity unspecified  INTERVENTION:  Continue current diet per SLP: DYS3, thin Encourage PO intake Feeding assistance Snacks BID, 2PM and 8PM Ensure Enlive po BID, each supplement provides 350 kcal and 20 grams of protein.  NUTRITION DIAGNOSIS:  Inadequate oral intake related to inability to eat as evidenced by NPO status. - Remains applicable  GOAL:  Patient will meet greater than or equal to 90% of their needs - progressing  MONITOR:  PO intake, Supplement acceptance, Labs, Weight trends  REASON FOR ASSESSMENT:  Consult Enteral/tube feeding initiation and management  ASSESSMENT:  Pt with hx of prior CVA, seizures, CHF, HTN, T2DM, OSA on CPAP, and GERD. 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 Admitted from home after progressive headaches and vomiting. 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  3/21 admitted to ICU for SDH, on cleviprex  3/24 off cleviprex , transferred out of ICU to IMTS 3/25 seizure activity 3/26 re-admitted to MICU, ventilated 3/27 EEG, lasix  for diuresis 4/2 - pt vomited, TF held but later resumed 4/7 - cortrak placed 4/8- extubated 4/9- SLP recommends continued NPO  4/11 - MBS, DYS 2, honey thick 4/16 - BSE, DYS 1, honey thick 4/21 - adjust to nocturnal feeds 4/20-4/24: kcal count, pt not meeting needs orally 4/25 - cortrak removed  Pt resting in bed at the time of assessment talking on the phone. Pt very distracted by her phone conversations, would not engage in nutrition assessment. Snack bag at bedside. Opened for pt and placed on bedside table for her to consume.  Admit weight: 111.1 kg  Current weight: 110.7 kg   Average Meal Intake: 4/11-4/14: 10% average intake x 5 recorded meals 4/15-4/21: 56% intake x 5 recorded meals 4/22-4/24: 37% intake x 5 recorded meals  Nutritionally Relevant Medications: Scheduled Meds:  atorvastatin   40 mg Oral Daily   famotidine   20 mg  Oral BID   feeding supplement  237 mL Oral BID BM   insulin  glargine-yfgn  40 Units Subcutaneous Daily   JUVEN  1 packet Oral BID BM   psyllium  1 packet Oral Daily   spironolactone   25 mg Oral Daily   PRN Meds: senna  Labs Reviewed: CBG rages from 121-155 mg/dL over the last 24 hours HgbA1c 11.3% (3/21)  Diet Order:   Diet Order             DIET DYS 3 Room service appropriate? Yes with Assist; Fluid consistency: Thin  Diet effective now                  EDUCATION NEEDS:  Not appropriate for education at this time  Skin:  Skin Assessment: Skin Integrity Issues: (new 4/10) Skin Integrity Issues:: Stage III Stage III: Ischial tuberosity (1.5 x 0.2 x 0.2 cm)  Last BM:  5/1 - type 6  Height:  Ht Readings from Last 1 Encounters:  03/19/24 5\' 9"  (1.753 m)   Weight:  Wt Readings from Last 1 Encounters:  04/12/24 110.7 kg    Ideal Body Weight:  66 kg  BMI:  Body mass index is 36.03 kg/m.  Estimated Nutritional Needs:  Kcal:  1800-2000 kcal/d Protein:  95-110g Fluid:  >2L    Edwena Graham, RD, LDN Registered Dietitian II Please reach out via secure chat

## 2024-04-12 NOTE — Plan of Care (Signed)
  Problem: Fluid Volume: Goal: Ability to maintain a balanced intake and output will improve Outcome: Progressing   Problem: Health Behavior/Discharge Planning: Goal: Ability to manage health-related needs will improve Outcome: Progressing   Problem: Metabolic: Goal: Ability to maintain appropriate glucose levels will improve Outcome: Progressing   Problem: Nutritional: Goal: Maintenance of adequate nutrition will improve Outcome: Progressing Goal: Progress toward achieving an optimal weight will improve Outcome: Progressing   Problem: Skin Integrity: Goal: Risk for impaired skin integrity will decrease Outcome: Progressing   Problem: Tissue Perfusion: Goal: Adequacy of tissue perfusion will improve Outcome: Progressing   Problem: Education: Goal: Knowledge of General Education information will improve Description: Including pain rating scale, medication(s)/side effects and non-pharmacologic comfort measures Outcome: Progressing   Problem: Health Behavior/Discharge Planning: Goal: Ability to manage health-related needs will improve Outcome: Progressing   Problem: Clinical Measurements: Goal: Ability to maintain clinical measurements within normal limits will improve Outcome: Progressing Goal: Will remain free from infection Outcome: Progressing Goal: Diagnostic test results will improve Outcome: Progressing Goal: Respiratory complications will improve Outcome: Progressing Goal: Cardiovascular complication will be avoided Outcome: Progressing   Problem: Nutrition: Goal: Adequate nutrition will be maintained Outcome: Progressing   Problem: Coping: Goal: Level of anxiety will decrease Outcome: Progressing   Problem: Elimination: Goal: Will not experience complications related to bowel motility Outcome: Progressing Goal: Will not experience complications related to urinary retention Outcome: Progressing   Problem: Pain Managment: Goal: General experience of  comfort will improve and/or be controlled Outcome: Progressing   Problem: Safety: Goal: Ability to remain free from injury will improve Outcome: Progressing   Problem: Skin Integrity: Goal: Risk for impaired skin integrity will decrease Outcome: Progressing   Problem: Health Behavior/Discharge Planning: Goal: Compliance with prescribed medication regimen will improve Outcome: Progressing   Problem: Medication: Goal: Risk for medication side effects will decrease Outcome: Progressing   Problem: Clinical Measurements: Goal: Complications related to the disease process, condition or treatment will be avoided or minimized Outcome: Progressing Goal: Diagnostic test results will improve Outcome: Progressing   Problem: Safety: Goal: Verbalization of understanding the information provided will improve Outcome: Progressing

## 2024-04-13 DIAGNOSIS — M869 Osteomyelitis, unspecified: Secondary | ICD-10-CM | POA: Diagnosis not present

## 2024-04-13 DIAGNOSIS — M40204 Unspecified kyphosis, thoracic region: Secondary | ICD-10-CM | POA: Diagnosis not present

## 2024-04-13 DIAGNOSIS — M4802 Spinal stenosis, cervical region: Secondary | ICD-10-CM | POA: Diagnosis not present

## 2024-04-13 DIAGNOSIS — M5030 Other cervical disc degeneration, unspecified cervical region: Secondary | ICD-10-CM | POA: Diagnosis not present

## 2024-04-13 DIAGNOSIS — E1140 Type 2 diabetes mellitus with diabetic neuropathy, unspecified: Secondary | ICD-10-CM | POA: Diagnosis not present

## 2024-04-13 DIAGNOSIS — F32A Depression, unspecified: Secondary | ICD-10-CM | POA: Diagnosis not present

## 2024-04-13 DIAGNOSIS — R1312 Dysphagia, oropharyngeal phase: Secondary | ICD-10-CM | POA: Diagnosis not present

## 2024-04-13 DIAGNOSIS — Z9181 History of falling: Secondary | ICD-10-CM | POA: Diagnosis not present

## 2024-04-13 DIAGNOSIS — G40209 Localization-related (focal) (partial) symptomatic epilepsy and epileptic syndromes with complex partial seizures, not intractable, without status epilepticus: Secondary | ICD-10-CM | POA: Diagnosis not present

## 2024-04-13 DIAGNOSIS — G4709 Other insomnia: Secondary | ICD-10-CM | POA: Diagnosis not present

## 2024-04-13 DIAGNOSIS — Z043 Encounter for examination and observation following other accident: Secondary | ICD-10-CM | POA: Diagnosis not present

## 2024-04-13 DIAGNOSIS — M6281 Muscle weakness (generalized): Secondary | ICD-10-CM | POA: Diagnosis not present

## 2024-04-13 DIAGNOSIS — W06XXXA Fall from bed, initial encounter: Secondary | ICD-10-CM | POA: Diagnosis not present

## 2024-04-13 DIAGNOSIS — R69 Illness, unspecified: Secondary | ICD-10-CM | POA: Diagnosis not present

## 2024-04-13 DIAGNOSIS — I700 Atherosclerosis of aorta: Secondary | ICD-10-CM | POA: Diagnosis not present

## 2024-04-13 DIAGNOSIS — Z7401 Bed confinement status: Secondary | ICD-10-CM | POA: Diagnosis not present

## 2024-04-13 DIAGNOSIS — S065X0A Traumatic subdural hemorrhage without loss of consciousness, initial encounter: Secondary | ICD-10-CM | POA: Diagnosis not present

## 2024-04-13 DIAGNOSIS — S0990XA Unspecified injury of head, initial encounter: Secondary | ICD-10-CM | POA: Diagnosis not present

## 2024-04-13 DIAGNOSIS — S06300A Unspecified focal traumatic brain injury without loss of consciousness, initial encounter: Secondary | ICD-10-CM | POA: Diagnosis not present

## 2024-04-13 DIAGNOSIS — R531 Weakness: Secondary | ICD-10-CM | POA: Diagnosis not present

## 2024-04-13 DIAGNOSIS — I6200 Nontraumatic subdural hemorrhage, unspecified: Secondary | ICD-10-CM | POA: Diagnosis not present

## 2024-04-13 DIAGNOSIS — G40901 Epilepsy, unspecified, not intractable, with status epilepticus: Secondary | ICD-10-CM | POA: Diagnosis not present

## 2024-04-13 DIAGNOSIS — Z8673 Personal history of transient ischemic attack (TIA), and cerebral infarction without residual deficits: Secondary | ICD-10-CM | POA: Diagnosis not present

## 2024-04-13 DIAGNOSIS — M5134 Other intervertebral disc degeneration, thoracic region: Secondary | ICD-10-CM | POA: Diagnosis not present

## 2024-04-13 DIAGNOSIS — F5105 Insomnia due to other mental disorder: Secondary | ICD-10-CM | POA: Diagnosis not present

## 2024-04-13 DIAGNOSIS — R404 Transient alteration of awareness: Secondary | ICD-10-CM | POA: Diagnosis not present

## 2024-04-13 DIAGNOSIS — F99 Mental disorder, not otherwise specified: Secondary | ICD-10-CM | POA: Diagnosis not present

## 2024-04-13 DIAGNOSIS — R519 Headache, unspecified: Secondary | ICD-10-CM | POA: Diagnosis present

## 2024-04-13 DIAGNOSIS — S069X9A Unspecified intracranial injury with loss of consciousness of unspecified duration, initial encounter: Secondary | ICD-10-CM | POA: Diagnosis not present

## 2024-04-13 DIAGNOSIS — Z7901 Long term (current) use of anticoagulants: Secondary | ICD-10-CM | POA: Diagnosis not present

## 2024-04-13 DIAGNOSIS — R561 Post traumatic seizures: Secondary | ICD-10-CM | POA: Diagnosis not present

## 2024-04-13 DIAGNOSIS — F4310 Post-traumatic stress disorder, unspecified: Secondary | ICD-10-CM | POA: Diagnosis not present

## 2024-04-13 DIAGNOSIS — M546 Pain in thoracic spine: Secondary | ICD-10-CM | POA: Diagnosis not present

## 2024-04-13 DIAGNOSIS — Z5181 Encounter for therapeutic drug level monitoring: Secondary | ICD-10-CM | POA: Diagnosis not present

## 2024-04-13 DIAGNOSIS — M79631 Pain in right forearm: Secondary | ICD-10-CM | POA: Diagnosis not present

## 2024-04-13 DIAGNOSIS — F411 Generalized anxiety disorder: Secondary | ICD-10-CM | POA: Diagnosis not present

## 2024-04-13 DIAGNOSIS — W19XXXA Unspecified fall, initial encounter: Secondary | ICD-10-CM | POA: Diagnosis not present

## 2024-04-13 DIAGNOSIS — R5381 Other malaise: Secondary | ICD-10-CM | POA: Diagnosis not present

## 2024-04-13 DIAGNOSIS — Z794 Long term (current) use of insulin: Secondary | ICD-10-CM | POA: Diagnosis not present

## 2024-04-13 DIAGNOSIS — R41841 Cognitive communication deficit: Secondary | ICD-10-CM | POA: Diagnosis not present

## 2024-04-13 LAB — GLUCOSE, CAPILLARY
Glucose-Capillary: 131 mg/dL — ABNORMAL HIGH (ref 70–99)
Glucose-Capillary: 120 mg/dL — ABNORMAL HIGH (ref 70–99)
Glucose-Capillary: 188 mg/dL — ABNORMAL HIGH (ref 70–99)

## 2024-04-13 MED ORDER — POLYETHYLENE GLYCOL 3350 17 G PO PACK: 17.0000 g | PACK | Freq: Every day | ORAL | Status: AC | PRN

## 2024-04-13 MED ORDER — ENSURE ENLIVE PO LIQD
237.0000 mL | Freq: Two times a day (BID) | ORAL | Status: AC
Start: 2024-04-13 — End: ?

## 2024-04-13 MED ORDER — GERHARDT'S BUTT CREAM: 1.0000 | TOPICAL_CREAM | Freq: Two times a day (BID) | CUTANEOUS | Status: DC

## 2024-04-13 MED ORDER — PSYLLIUM 95 % PO PACK: 1.0000 | PACK | Freq: Every day | ORAL | Status: AC

## 2024-04-13 MED ORDER — ACETAMINOPHEN 325 MG PO TABS: 650.0000 mg | ORAL_TABLET | Freq: Four times a day (QID) | ORAL | Status: DC | PRN

## 2024-04-13 MED ORDER — FAMOTIDINE 20 MG PO TABS: 20.0000 mg | ORAL_TABLET | Freq: Two times a day (BID) | ORAL | Status: DC

## 2024-04-13 MED ORDER — ISOSORB DINITRATE-HYDRALAZINE 20-37.5 MG PO TABS: 1.0000 | ORAL_TABLET | Freq: Three times a day (TID) | ORAL | Status: DC

## 2024-04-13 MED ORDER — METOPROLOL TARTRATE 75 MG PO TABS: 75.0000 mg | ORAL_TABLET | Freq: Two times a day (BID) | ORAL | Status: DC

## 2024-04-13 MED ORDER — INSULIN GLARGINE-YFGN 100 UNIT/ML ~~LOC~~ SOLN: 40.0000 [IU] | Freq: Every day | SUBCUTANEOUS | Status: DC

## 2024-04-13 MED ORDER — POLYETHYLENE GLYCOL 3350 17 G PO PACK
17.0000 g | PACK | Freq: Every day | ORAL | Status: DC
  Administered 2024-04-13: 17 g via ORAL
  Filled 2024-04-13: qty 1

## 2024-04-13 MED ORDER — SPIRONOLACTONE 25 MG PO TABS: 25.0000 mg | ORAL_TABLET | Freq: Every day | ORAL | Status: DC

## 2024-04-13 MED ORDER — SENNA 8.6 MG PO TABS: 1.0000 | ORAL_TABLET | Freq: Two times a day (BID) | ORAL | Status: AC | PRN

## 2024-04-13 MED ORDER — JUVEN PO PACK
1.0000 | PACK | Freq: Two times a day (BID) | ORAL | Status: AC
Start: 2024-04-13 — End: ?

## 2024-04-13 MED ORDER — LACOSAMIDE 200 MG PO TABS: 200.0000 mg | ORAL_TABLET | Freq: Two times a day (BID) | ORAL | Status: DC

## 2024-04-13 NOTE — TOC Progression Note (Signed)
 Transition of Care [facility]) - Progression Note    Patient Details  Name: [name] MRN: [id number] Date of Birth: 10/30/1966  [facility]) CM/SW Contact  Tandy Fam, [place] Phone Number: 04/13/2024, 10:09 AM  Clinical Narrative:   CSW spoke with Admissions at [facility], insurance authorization remains pending at this time. CSW updated MD with barrier to discharge. PT assigned to see patient today if insurance asks for updated notes. CSW to follow.    Expected Discharge Plan: Skilled Nursing Facility Barriers to Discharge: Insurance Authorization  Expected Discharge Plan and Services In-house Referral: Clinical Social Work   Post Acute Care Choice: Skilled Nursing Facility Living arrangements for the past 2 months: Apartment                                       Social Determinants of Health (SDOH) Interventions SDOH Screenings   Food Insecurity: No Food Insecurity (03/02/2024)  Housing: High Risk (03/02/2024)  Transportation Needs: Unmet Transportation Needs (03/02/2024)  Utilities: At Risk (03/02/2024)  Alcohol  Screen: Low Risk  (03/23/2023)  Depression (PHQ2-9): High Risk (06/02/2023)  Financial Resource Strain: Medium Risk (03/23/2023)  Physical Activity: Insufficiently Active (03/23/2023)  Social Connections: Moderately Isolated (03/02/2024)  Tobacco Use: Medium Risk (03/02/2024)    Readmission Risk Interventions    03/11/2023    3:14 PM  Readmission Risk Prevention Plan  Transportation Screening Complete  PCP or Specialist Appt within 5-7 Days Complete  Home Care Screening Complete  Medication Review (RN CM) Referral to Pharmacy

## 2024-04-13 NOTE — Progress Notes (Signed)
 Report called to tammy at [facility]

## 2024-04-13 NOTE — TOC Progression Note (Signed)
 Transition of Care [facility]) - Progression Note    Patient Details  Name: [name] MRN: [id number] Date of Birth: 1966-08-22  Transition [facility] Care [facility]) CM/SW Contact  Tandy Fam, [place] Phone Number: 04/13/2024, 10:08 AM  Clinical Narrative:   CSW spoke with Admissions at [facility], authorization is still pending at this time. CSW updated MD with barrier to discharge. CSW to follow.    Expected Discharge Plan: Skilled Nursing Facility Barriers to Discharge: Insurance Authorization  Expected Discharge Plan and Services In-house Referral: Clinical Social Work   Post Acute Care Choice: Skilled Nursing Facility Living arrangements for the past 2 months: Apartment                                       Social Determinants of Health (SDOH) Interventions SDOH Screenings   Food Insecurity: No Food Insecurity (03/02/2024)  Housing: High Risk (03/02/2024)  Transportation Needs: Unmet Transportation Needs (03/02/2024)  Utilities: At Risk (03/02/2024)  Alcohol  Screen: Low Risk  (03/23/2023)  Depression (PHQ2-9): High Risk (06/02/2023)  Financial Resource Strain: Medium Risk (03/23/2023)  Physical Activity: Insufficiently Active (03/23/2023)  Social Connections: Moderately Isolated (03/02/2024)  Tobacco Use: Medium Risk (03/02/2024)    Readmission Risk Interventions    03/11/2023    3:14 PM  Readmission Risk Prevention Plan  Transportation Screening Complete  PCP or Specialist Appt within 5-7 Days Complete  Home Care Screening Complete  Medication Review (RN CM) Referral to Pharmacy

## 2024-04-13 NOTE — TOC Transition Note (Signed)
 Transition of Care [facility]) - Discharge Note   Patient Details  Name: [name] MRN: [id number] Date of Birth: 11-15-1966  Transition of Care [facility]) CM/SW Contact:  Tandy Fam, LCSW Phone Number: 04/13/2024, 2:31 PM   Clinical Narrative:   CSW updated by [facility] that patient has received insurance authorization to admit to SNF. CSW updated MD, sent discharge information to [facility]. CSW attempted to notify legal guardian, Shelley Dew, left a voicemail and requesting a call back. Transport arranged with [facility] for next available.  Nurse to call report to [phone].    Final next level of care: Skilled Nursing Facility Barriers to Discharge: Barriers Resolved   Patient Goals and CMS Choice            Discharge Placement              Patient chooses bed at:  [facility]) Patient to be transferred to facility by: [facility] Name of family member notified: Left voicemail for guardian, Shelley Dew Patient and family notified of of transfer: 04/13/24  Discharge Plan and Services Additional resources added to the After Visit Summary for   In-house Referral: Clinical Social Work   Post Acute Care Choice: Skilled Nursing Facility                               Social Drivers of Health (SDOH) Interventions SDOH Screenings   Food Insecurity: No Food Insecurity (03/02/2024)  Housing: High Risk (03/02/2024)  Transportation Needs: Unmet Transportation Needs (03/02/2024)  Utilities: At Risk (03/02/2024)  Alcohol  Screen: Low Risk  (03/23/2023)  Depression (PHQ2-9): High Risk (06/02/2023)  Financial Resource Strain: Medium Risk (03/23/2023)  Physical Activity: Insufficiently Active (03/23/2023)  Social Connections: Moderately Isolated (03/02/2024)  Tobacco Use: Medium Risk (03/02/2024)     Readmission Risk Interventions    03/11/2023    3:14 PM  Readmission Risk Prevention Plan  Transportation Screening Complete  PCP or Specialist Appt within 5-7 Days Complete   Home Care Screening Complete  Medication Review (RN CM) Referral to Pharmacy
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
## 2024-04-13 NOTE — Progress Notes (Signed)
 HD#42 SUBJECTIVE:  Patient Summary: [name] is a 58 y.o. with a pertinent PMH of prior CVA with residual left sided hemiplegia, PAF on xarelto , seizures, chronic combined CHF, LBBB s/p CRT-D medtronic, PAD, HTN, T2DM, OSA on CPAP, and obesity, who was admitted with a SDH c/b status epilepticus requiring an ICU stay/intubation, now extubated, but with continued neurocognitive decline following TBI.   Overnight Events: No acute events overnight  Interim History: Has not have a BM yet. Is constipated. Has been eating smoothies and has a banana. No Sob, cough.   OBJECTIVE:  Vital Signs: Vitals:   04/12/24 2326 04/13/24 0349 04/13/24 0730 04/13/24 1113  BP: 111/78 116/75 138/86 127/81  Pulse: 96 80 83 84  Resp: 18 16 20 20   Temp: 98.5 F (36.9 C) 98.1 F (36.7 C) 98.1 F (36.7 C) 98.8 F (37.1 C)  TempSrc: Oral Oral Oral Oral  SpO2: 97% 97% 99% 92%  Weight:      Height:       Supplemental O2: Room Air SpO2: 99 %  Filed Weights   04/08/24 0500 04/11/24 0500 04/12/24 0500  Weight: 109.8 kg 111.1 kg 110.7 kg     Intake/Output Summary (Last 24 hours) at 04/13/2024 1139 Last data filed at 04/13/2024 0900 Gross per 24 hour  Intake 240 ml  Output --  Net 240 ml   Net IO Since Admission: 15,817.42 mL [04/13/24 1139]  Physical Exam: General: Chronically ill appearing female laying in bed. No acute distress Pulmonary: Normal work of breathing on room air Heart: rrr, no mrg, no BLE edema Skin: Warm and dry.  Neuro: A&Ox3 Psych: Normal mood and affect  ASSESSMENT/PLAN:  Assessment: Principal Problem:   Traumatic brain injury [facility]) Active Problems:   Benign essential HTN   Chronic combined systolic and diastolic heart failure (HCC)   Hemiparesis affecting left side as late effect of cerebrovascular accident [facility])   Poorly controlled type 2 diabetes mellitus with neuropathy (HCC)   Atrial fibrillation (HCC)   Type 2 diabetes mellitus with hyperglycemia, with  long-term current use of insulin  (HCC)   Subdural hematoma (HCC)   Seizures, post-traumatic (HCC)   Status epilepticus (HCC), resolved   Pressure injury of buttock, stage 2 (HCC)   Pressure injury of coccygeal region, stage 2 (HCC)   On tube feeding diet   Decreased level of consciousness   Seizure disorder as sequela of cerebrovascular accident [facility])   Post traumatic seizure disorder (HCC)   Severe comorbid illness   Subdural hemorrhage (HCC)   On continuous oral anticoagulation   Plan: Neurocognitive decline following TBI Seizure disorder 2/2 prior CVA Protein calorie malnutrition Will benefit from continued PT/OT. Pending authoriazation from insurance to go to SNF.   - Continue keppra  500 mg BID - Continue vimpat  200 mg BID - Continue feeding supplements   Type 2 diabetes Given fluctuating amounts of food intake. Glucose stable. Cw semglee  40 daily - Continue Semglee  40u daily  - CBGs q4h   HFrEF (25-30% in 2023) She is currently on BiDil  20-37.5 mg, Lopressor  75 BID, and Aldactone  25 daily. Not on Entresto with hx of angioedema. Not a good candidate for SGLT2-I given recent ESBL UTI.  She is euvolemic at this time with no increased oxygen requirements.  -cw BiDil  20-37.5 mg   Constipation  Adding miralax  daily. On Sennakot-S PRN   HTN BP control as above   HLD Continue home Lipitor  Best Practice: Diet: DYS 2 IVF: Fluids: none VTE: SCDs Start:  03/02/24 1229 Code: DNR AB: None Therapy Recs: SNF DISPO: Anticipated discharge to Skilled nursing facility pending insurance auth.  Signature: Jose Ngo, MD PGY-1 [facility]  11:39 AM, 04/13/2024   Please contact the on call pager for urgent questions or emergencies at [phone].
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
## 2024-04-14 ENCOUNTER — Emergency Department (HOSPITAL_COMMUNITY)

## 2024-04-14 ENCOUNTER — Emergency Department (HOSPITAL_COMMUNITY)
Admission: EM | Admit: 2024-04-14 | Discharge: 2024-04-14 | Disposition: A | Discharge status: Skilled Nursing Facility | Attending: Emergency Medicine | Admitting: Emergency Medicine

## 2024-04-14 ENCOUNTER — Encounter (HOSPITAL_COMMUNITY): Payer: Self-pay | Admitting: Emergency Medicine

## 2024-04-14 ENCOUNTER — Other Ambulatory Visit: Payer: Self-pay

## 2024-04-14 DIAGNOSIS — I700 Atherosclerosis of aorta: Secondary | ICD-10-CM | POA: Diagnosis not present

## 2024-04-14 DIAGNOSIS — R519 Headache, unspecified: Secondary | ICD-10-CM | POA: Insufficient documentation

## 2024-04-14 DIAGNOSIS — M5134 Other intervertebral disc degeneration, thoracic region: Secondary | ICD-10-CM | POA: Diagnosis not present

## 2024-04-14 DIAGNOSIS — Z7901 Long term (current) use of anticoagulants: Secondary | ICD-10-CM | POA: Insufficient documentation

## 2024-04-14 DIAGNOSIS — Z794 Long term (current) use of insulin: Secondary | ICD-10-CM | POA: Diagnosis not present

## 2024-04-14 DIAGNOSIS — M40204 Unspecified kyphosis, thoracic region: Secondary | ICD-10-CM | POA: Diagnosis not present

## 2024-04-14 DIAGNOSIS — W19XXXA Unspecified fall, initial encounter: Secondary | ICD-10-CM

## 2024-04-14 DIAGNOSIS — M4802 Spinal stenosis, cervical region: Secondary | ICD-10-CM | POA: Diagnosis not present

## 2024-04-14 DIAGNOSIS — M546 Pain in thoracic spine: Secondary | ICD-10-CM | POA: Insufficient documentation

## 2024-04-14 DIAGNOSIS — S065X0A Traumatic subdural hemorrhage without loss of consciousness, initial encounter: Secondary | ICD-10-CM | POA: Diagnosis not present

## 2024-04-14 DIAGNOSIS — S06300A Unspecified focal traumatic brain injury without loss of consciousness, initial encounter: Secondary | ICD-10-CM | POA: Diagnosis not present

## 2024-04-14 DIAGNOSIS — M79631 Pain in right forearm: Secondary | ICD-10-CM | POA: Diagnosis not present

## 2024-04-14 DIAGNOSIS — W06XXXA Fall from bed, initial encounter: Secondary | ICD-10-CM | POA: Diagnosis not present

## 2024-04-14 DIAGNOSIS — M5030 Other cervical disc degeneration, unspecified cervical region: Secondary | ICD-10-CM | POA: Diagnosis not present

## 2024-04-14 DIAGNOSIS — Z043 Encounter for examination and observation following other accident: Secondary | ICD-10-CM | POA: Diagnosis not present

## 2024-04-14 DIAGNOSIS — Z8673 Personal history of transient ischemic attack (TIA), and cerebral infarction without residual deficits: Secondary | ICD-10-CM | POA: Diagnosis not present

## 2024-04-14 DIAGNOSIS — S0990XA Unspecified injury of head, initial encounter: Secondary | ICD-10-CM | POA: Diagnosis not present

## 2024-04-14 LAB — CBC WITH DIFFERENTIAL/PLATELET
Abs Immature Granulocytes: 0.03 10*3/uL (ref 0.00–0.07)
Basophils Absolute: 0 10*3/uL (ref 0.0–0.1)
Basophils Relative: 0 %
Eosinophils Absolute: 0.1 10*3/uL (ref 0.0–0.5)
Eosinophils Relative: 1 %
HCT: 41.8 % (ref 36.0–46.0)
Hemoglobin: 12.9 g/dL (ref 12.0–15.0)
Immature Granulocytes: 0 %
Lymphocytes Relative: 30 %
Lymphs Abs: 3.5 10*3/uL (ref 0.7–4.0)
MCH: 27.1 pg (ref 26.0–34.0)
MCHC: 30.9 g/dL (ref 30.0–36.0)
MCV: 87.8 fL (ref 80.0–100.0)
Monocytes Absolute: 1.1 10*3/uL — ABNORMAL HIGH (ref 0.1–1.0)
Monocytes Relative: 9 %
Neutro Abs: 6.9 10*3/uL (ref 1.7–7.7)
Neutrophils Relative %: 60 %
Platelets: 141 10*3/uL — ABNORMAL LOW (ref 150–400)
RBC: 4.76 MIL/uL (ref 3.87–5.11)
RDW: 14.3 % (ref 11.5–15.5)
WBC: 11.7 10*3/uL — ABNORMAL HIGH (ref 4.0–10.5)
nRBC: 0 % (ref 0.0–0.2)

## 2024-04-14 LAB — BASIC METABOLIC PANEL WITH GFR
Anion gap: 14 (ref 5–15)
BUN: 13 mg/dL (ref 6–20)
CO2: 19 mmol/L — ABNORMAL LOW (ref 22–32)
Calcium: 9.3 mg/dL (ref 8.9–10.3)
Chloride: 104 mmol/L (ref 98–111)
Creatinine, Ser: 0.73 mg/dL (ref 0.44–1.00)
GFR, Estimated: >60 mL/min (ref >60)
Glucose, Bld: 124 mg/dL — ABNORMAL HIGH (ref 70–99)
Potassium: 3.5 mmol/L (ref 3.5–5.1)
Sodium: 137 mmol/L (ref 135–145)
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
## 2024-04-19 ENCOUNTER — Other Ambulatory Visit: Payer: Self-pay | Admitting: Internal Medicine

## 2024-04-19 DIAGNOSIS — I4891 Unspecified atrial fibrillation: Secondary | ICD-10-CM

## 2024-04-19 NOTE — Telephone Encounter (Signed)
 [facility] - 11/29/23 telehealth.

## 2024-04-23 ENCOUNTER — Other Ambulatory Visit: Payer: Self-pay | Admitting: Internal Medicine

## 2024-04-23 DIAGNOSIS — E1169 Type 2 diabetes mellitus with other specified complication: Secondary | ICD-10-CM

## 2024-04-23 NOTE — Telephone Encounter (Signed)
 Meds stopped at discharge

## 2024-04-24 DIAGNOSIS — G40901 Epilepsy, unspecified, not intractable, with status epilepticus: Secondary | ICD-10-CM | POA: Diagnosis not present

## 2024-04-24 DIAGNOSIS — F32A Depression, unspecified: Secondary | ICD-10-CM | POA: Diagnosis not present

## 2024-04-24 DIAGNOSIS — F99 Mental disorder, not otherwise specified: Secondary | ICD-10-CM | POA: Diagnosis not present

## 2024-04-24 DIAGNOSIS — R561 Post traumatic seizures: Secondary | ICD-10-CM | POA: Diagnosis not present

## 2024-04-24 DIAGNOSIS — F5105 Insomnia due to other mental disorder: Secondary | ICD-10-CM | POA: Diagnosis not present

## 2024-04-24 DIAGNOSIS — F411 Generalized anxiety disorder: Secondary | ICD-10-CM | POA: Diagnosis not present

## 2024-04-24 DIAGNOSIS — G4709 Other insomnia: Secondary | ICD-10-CM | POA: Diagnosis not present

## 2024-04-24 DIAGNOSIS — F4310 Post-traumatic stress disorder, unspecified: Secondary | ICD-10-CM | POA: Diagnosis not present

## 2024-04-26 ENCOUNTER — Other Ambulatory Visit: Payer: Self-pay | Admitting: Internal Medicine

## 2024-04-30 ENCOUNTER — Telehealth: Payer: Self-pay | Admitting: *Deleted

## 2024-04-30 DIAGNOSIS — G4709 Other insomnia: Secondary | ICD-10-CM | POA: Diagnosis not present

## 2024-04-30 DIAGNOSIS — F4310 Post-traumatic stress disorder, unspecified: Secondary | ICD-10-CM | POA: Diagnosis not present

## 2024-04-30 DIAGNOSIS — F411 Generalized anxiety disorder: Secondary | ICD-10-CM | POA: Diagnosis not present

## 2024-04-30 DIAGNOSIS — G40901 Epilepsy, unspecified, not intractable, with status epilepticus: Secondary | ICD-10-CM | POA: Diagnosis not present

## 2024-04-30 DIAGNOSIS — F99 Mental disorder, not otherwise specified: Secondary | ICD-10-CM | POA: Diagnosis not present

## 2024-04-30 DIAGNOSIS — F32A Depression, unspecified: Secondary | ICD-10-CM | POA: Diagnosis not present

## 2024-04-30 DIAGNOSIS — R561 Post traumatic seizures: Secondary | ICD-10-CM | POA: Diagnosis not present

## 2024-04-30 DIAGNOSIS — F5105 Insomnia due to other mental disorder: Secondary | ICD-10-CM | POA: Diagnosis not present

## 2024-04-30 NOTE — Telephone Encounter (Signed)
 [name], at [employer],  was called and informed K+ has been discontinued.

## 2024-04-30 NOTE — Telephone Encounter (Signed)
 Copied from CRM [id number]. Topic: Clinical - Medication Question >> Apr 30, 2024 12:14 PM Blair Bumpers wrote: Reason for CRM: Bearl Botts, with [facility], called in wanting to know if patient's potassium was discontinued. Per chart notes, it shows potassium was discontinued on 04/13/24. Please give her a call to confirm. CB #: [id number].

## 2024-04-30 NOTE — Telephone Encounter (Signed)
 Medication discontinued 04/13/24

## 2024-05-01 ENCOUNTER — Other Ambulatory Visit: Payer: Self-pay | Admitting: Internal Medicine

## 2024-05-08 ENCOUNTER — Other Ambulatory Visit (HOSPITAL_COMMUNITY): Payer: Self-pay

## 2024-05-08 DIAGNOSIS — I5022 Chronic systolic (congestive) heart failure: Secondary | ICD-10-CM

## 2024-05-08 MED ORDER — POTASSIUM CHLORIDE CRYS ER 20 MEQ PO TBCR
20.0000 meq | EXTENDED_RELEASE_TABLET | Freq: Every day | ORAL | 0 refills | Status: DC
Start: 2024-05-08 — End: 2024-08-28

## 2024-05-16 ENCOUNTER — Encounter: Payer: Self-pay | Admitting: Neurology

## 2024-05-16 ENCOUNTER — Ambulatory Visit (INDEPENDENT_AMBULATORY_CARE_PROVIDER_SITE_OTHER): Admitting: Neurology

## 2024-05-16 VITALS — BP 133/80 | HR 68 | Ht 69.0 in

## 2024-05-16 DIAGNOSIS — G40209 Localization-related (focal) (partial) symptomatic epilepsy and epileptic syndromes with complex partial seizures, not intractable, without status epilepticus: Secondary | ICD-10-CM

## 2024-05-16 DIAGNOSIS — I6200 Nontraumatic subdural hemorrhage, unspecified: Secondary | ICD-10-CM | POA: Diagnosis not present

## 2024-05-16 DIAGNOSIS — Z5181 Encounter for therapeutic drug level monitoring: Secondary | ICD-10-CM

## 2024-05-16 DIAGNOSIS — S06360D Traumatic hemorrhage of cerebrum, unspecified, without loss of consciousness, subsequent encounter: Secondary | ICD-10-CM

## 2024-05-16 MED ORDER — LEVETIRACETAM 500 MG PO TABS
500.0000 mg | ORAL_TABLET | Freq: Two times a day (BID) | ORAL | 3 refills | Status: DC
Start: 2024-05-16 — End: 2024-07-24

## 2024-05-16 MED ORDER — LACOSAMIDE 200 MG PO TABS: 200.0000 mg | ORAL_TABLET | Freq: Two times a day (BID) | ORAL | 3 refills | Status: DC
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
## 2024-05-16 NOTE — Patient Instructions (Signed)
 Continue with Keppra  500 mg twice daily Continue Vimpat  200 mg twice daily Will check a Keppra  level and lacosamide  level today Continue follow-up with PCP Return in 6 months or sooner if worse Please contact PCP to have a breakthrough seizure.

## 2024-05-17 ENCOUNTER — Other Ambulatory Visit: Payer: Self-pay | Admitting: Internal Medicine

## 2024-05-17 DIAGNOSIS — E1169 Type 2 diabetes mellitus with other specified complication: Secondary | ICD-10-CM

## 2024-05-17 LAB — BASIC METABOLIC PANEL WITH GFR
BUN/Creatinine Ratio: 9 (ref 9–23)
BUN: 7 mg/dL (ref 6–24)
CO2: 22 mmol/L (ref 20–29)
Calcium: 9.3 mg/dL (ref 8.7–10.2)
Chloride: 99 mmol/L (ref 96–106)
Creatinine, Ser: 0.76 mg/dL (ref 0.57–1.00)
Glucose: 146 mg/dL — ABNORMAL HIGH (ref 70–99)
Potassium: 3.9 mmol/L (ref 3.5–5.2)
Sodium: 139 mmol/L (ref 134–144)
eGFR: 91 mL/min/{1.73_m2} (ref >59)

## 2024-05-17 LAB — LEVETIRACETAM LEVEL: Levetiracetam Lvl: 22.5 ug/mL (ref 10.0–40.0)

## 2024-05-17 LAB — LAMOTRIGINE LEVEL: Lamotrigine Lvl: <1 ug/mL — ABNORMAL LOW (ref 2.0–20.0)

## 2024-05-18 NOTE — Telephone Encounter (Signed)
 Medication discontinued 04/13/24

## 2024-05-19 ENCOUNTER — Ambulatory Visit: Payer: Self-pay | Admitting: Neurology

## 2024-05-22 ENCOUNTER — Other Ambulatory Visit: Payer: Self-pay | Admitting: Internal Medicine

## 2024-05-22 ENCOUNTER — Telehealth: Payer: Self-pay | Admitting: Internal Medicine

## 2024-05-22 NOTE — Telephone Encounter (Signed)
 [name] called and spoke to [name], Pensions consultant advising gabapentin  was discontinued by provider at discharge. She verbalized understanding.   Copied from CRM [id number]. Topic: Clinical - Prescription Issue >> May 22, 2024 12:27 PM [name] wrote: [name] calling regarding Patient [name]'s medication: Gabapentin    They are questioning if it has been discontinued  Please advise

## 2024-05-22 NOTE — Telephone Encounter (Signed)
 Discontinued at discharge on 5/2

## 2024-06-14 ENCOUNTER — Telehealth: Payer: Self-pay | Admitting: *Deleted

## 2024-06-14 ENCOUNTER — Other Ambulatory Visit: Payer: Self-pay

## 2024-06-14 ENCOUNTER — Other Ambulatory Visit: Payer: Self-pay | Admitting: Internal Medicine

## 2024-06-14 MED ORDER — LACOSAMIDE 100 MG PO TABS: 200.0000 mg | ORAL_TABLET | Freq: Two times a day (BID) | ORAL | 0 refills | Status: DC

## 2024-06-14 NOTE — Telephone Encounter (Signed)
 Copied from CRM [id number]. Topic: Clinical - Medication Refill >> Jun 14, 2024 12:33 PM Fredrica W wrote: Medication: pharmacy requested. Patient just discharged from Rehab center - need these filles  amLODipine  (NORVASC ) 10 MG tablet lacosamide  (VIMPAT ) 100 MG TABS tablet - 2 capsules twice per day - is what they have in stock   Has the patient contacted their pharmacy? Yes (Agent: If no, request that the patient contact the pharmacy for the refill. If patient does not wish to contact the pharmacy document the reason why and proceed with request.) (Agent: If yes, when and what did the pharmacy advise?)  This is the patient's preferred pharmacy:  [facility] - [place], [place] - [address] [place] [address] Phone: [phone] Fax: [phone]  Is this the correct pharmacy for this prescription? Yes If no, delete pharmacy and type the correct one.   Has the prescription been filled recently? No  Is the patient out of the medication? Yes - just discharged from rehab center   Has the patient been seen for an appointment in the last year OR does the patient have an upcoming appointment? Yes  Can we respond through MyChart? No  Agent: Please be advised that Rx refills may take up to 3 business days. We ask that you follow-up with your pharmacy.

## 2024-06-14 NOTE — Telephone Encounter (Signed)
 Received a call from [facility] - stated pt just got out of rehab and needs a refill on Amlodipine . Not on pt's current med list. Sending to the doctor.

## 2024-06-14 NOTE — Telephone Encounter (Signed)
 Spoke with [name] at [facility], unable to get 200 mg vimpat  tablets, requesting 30 day supply of 100 mg tablets until 200 mg tablet come in. Advised I would send to Dr. Gregg For review.

## 2024-06-14 NOTE — Telephone Encounter (Signed)
 OPEN ERROR

## 2024-06-14 NOTE — Telephone Encounter (Signed)
 RTC to patient message was left on her voice mail that the Amlodipine  was discontinued by her Heart Failure Team.  She also needs to schedule a follow up appointment so that the doctors can check on her blood pressure.

## 2024-06-18 DIAGNOSIS — I4891 Unspecified atrial fibrillation: Secondary | ICD-10-CM | POA: Diagnosis not present

## 2024-06-18 DIAGNOSIS — I5042 Chronic combined systolic (congestive) and diastolic (congestive) heart failure: Secondary | ICD-10-CM | POA: Diagnosis not present

## 2024-06-18 DIAGNOSIS — I69352 Hemiplegia and hemiparesis following cerebral infarction affecting left dominant side: Secondary | ICD-10-CM | POA: Diagnosis not present

## 2024-06-18 DIAGNOSIS — I69354 Hemiplegia and hemiparesis following cerebral infarction affecting left non-dominant side: Secondary | ICD-10-CM | POA: Diagnosis not present

## 2024-06-18 DIAGNOSIS — M869 Osteomyelitis, unspecified: Secondary | ICD-10-CM | POA: Diagnosis not present

## 2024-06-19 ENCOUNTER — Other Ambulatory Visit: Payer: Self-pay | Admitting: *Deleted

## 2024-06-19 DIAGNOSIS — M1712 Unilateral primary osteoarthritis, left knee: Secondary | ICD-10-CM

## 2024-06-19 MED ORDER — DICLOFENAC SODIUM 1 % EX GEL: 4.0000 g | Freq: Four times a day (QID) | CUTANEOUS | 1 refills | Status: DC | PRN

## 2024-06-21 DIAGNOSIS — I4891 Unspecified atrial fibrillation: Secondary | ICD-10-CM | POA: Diagnosis not present

## 2024-06-21 DIAGNOSIS — I69352 Hemiplegia and hemiparesis following cerebral infarction affecting left dominant side: Secondary | ICD-10-CM | POA: Diagnosis not present

## 2024-06-21 DIAGNOSIS — I5042 Chronic combined systolic (congestive) and diastolic (congestive) heart failure: Secondary | ICD-10-CM | POA: Diagnosis not present

## 2024-06-21 DIAGNOSIS — M869 Osteomyelitis, unspecified: Secondary | ICD-10-CM | POA: Diagnosis not present

## 2024-06-21 DIAGNOSIS — I69354 Hemiplegia and hemiparesis following cerebral infarction affecting left non-dominant side: Secondary | ICD-10-CM | POA: Diagnosis not present

## 2024-07-03 ENCOUNTER — Encounter

## 2024-07-05 ENCOUNTER — Other Ambulatory Visit: Payer: Self-pay | Admitting: Neurology

## 2024-07-05 NOTE — Telephone Encounter (Signed)
 Requested Prescriptions   Pending Prescriptions Disp Refills   Lacosamide  100 MG TABS [Pharmacy Med Name: lacosamide  100 mg tablet] 120 tablet 0    Sig: Take 2 tablets (200 mg total) by mouth 2 (two) times daily.   Last seen 05/16/24, next appt 01/08/25  Dispenses   Dispensed Days Supply Quantity Provider Pharmacy  lacosamide  100 mg tablet 06/14/2024 30 120 each Gregg Lek, MD [facility]...  LACOSAMIDE    TAB 200MG  06/10/2024 30 60 tablet Gregg Lek, MD [facility] ...  lacosamide  (Vimpat ) 200 mg tablet 04/17/2024 30  GRAHAM,JAMIE RAE FNP-BC [facility] ...     Not eligible until 07/15/24 refill refused

## 2024-07-09 ENCOUNTER — Other Ambulatory Visit: Payer: Self-pay

## 2024-07-09 MED ORDER — SPIRONOLACTONE 25 MG PO TABS: 25.0000 mg | ORAL_TABLET | Freq: Every day | ORAL | 3 refills | Status: DC

## 2024-07-09 MED ORDER — FAMOTIDINE 20 MG PO TABS: 20.0000 mg | ORAL_TABLET | Freq: Two times a day (BID) | ORAL | 3 refills | Status: DC

## 2024-07-09 NOTE — Telephone Encounter (Signed)
 Last rx sent as no print instead of normal

## 2024-07-10 ENCOUNTER — Telehealth: Payer: Self-pay | Admitting: *Deleted

## 2024-07-10 NOTE — Telephone Encounter (Signed)
 RTCto Dominique Pharmacist from [facility].  Requesting refills on meds that patient was stated on while she was in Rehab. Unable to refill as the Clinics do not know what meds patient is on nor that she had been in rehab.  No Clinic appointments since 11/2023.  Dominique to sent ober the list of meds that they have been putting in Pill packs for patient.  Call to patient to set up an appointment to come in for a check up.   Message left to call for an appointment. Copied from CRM [id number]. Topic: Clinical - Medication Question >> Jul 10, 2024 12:36 PM Diannia H wrote: Reason for CRM: Dominique from [facility] was calling to refill patients medicines (its about 6 meds) but she had some questions and needs to speak to a nurse, could you please assist? Callback number is [phone].

## 2024-07-13 ENCOUNTER — Encounter: Admitting: Student

## 2024-07-16 ENCOUNTER — Other Ambulatory Visit: Payer: Self-pay | Admitting: Neurology

## 2024-07-16 ENCOUNTER — Other Ambulatory Visit: Payer: Self-pay | Admitting: Radiology

## 2024-07-16 DIAGNOSIS — N958 Other specified menopausal and perimenopausal disorders: Secondary | ICD-10-CM

## 2024-07-16 NOTE — Telephone Encounter (Signed)
 Correction: Last Mammo: 10/31/23

## 2024-07-16 NOTE — Telephone Encounter (Signed)
 Med refill request: PREMARIN  vaginal cream -  Dose: 1 g Route: Vaginal Frequency: 3 times weekly  Dispense Quantity: 42.5 g (98 day supply) Refills: 2   Duration: -- Dispense As Written: No        Sig: Place 0.5 Applicatorfuls vaginally 3 (three) times a week.   Last AEX: 11/15/23 Next AEX: 11/27/24 Last MMG (if hormonal med): 11/16/23 Refill authorized: Please Advise? Last Rx sent  #1 with 2 refills on 11/26/23 Please approve or deny.

## 2024-07-17 ENCOUNTER — Telehealth: Payer: Self-pay | Admitting: Student

## 2024-07-17 NOTE — Telephone Encounter (Signed)
 RTC to [name] informed her that patient will need to see the physician here prior to getting refills on meds .  Patient has been set up for an appointment in the Clinics on 07/19/2024.  [name] has informed patient that once  she comes in for her visit the doctor can proceed with the refills.

## 2024-07-17 NOTE — Telephone Encounter (Signed)
 Requested Prescriptions   Pending Prescriptions Disp Refills   Lacosamide  100 MG TABS [Pharmacy Med Name: lacosamide  100 mg tablet] 120 tablet 0    Sig: Take 2 tablets (200 mg total) by mouth 2 (two) times daily.   Last seen 05/16/24, next appt 01/08/25 Dispenses   Dispensed Days Supply Quantity Provider Pharmacy  lacosamide  100 mg tablet 06/14/2024 30 120 each Gregg Lek, MD [facility]...  LACOSAMIDE    TAB 200MG  06/10/2024 30 60 tablet Camara, Amadou, MD [facility] ...  lacosamide  (Vimpat ) 200 mg tablet 04/17/2024 30  GRAHAM,JAMIE RAE FNP-BC [facility] .SABRASABRA

## 2024-07-17 NOTE — Telephone Encounter (Unsigned)
 Copied from CRM [id number]. Topic: Clinical - Medication Question >> Jul 17, 2024  2:45 PM Cherylann RAMAN wrote: Reason for CRM: [facility], requesting to speak with Kenneth regarding medications that patient was discharged with. Please contact [name] at [phone].

## 2024-07-18 ENCOUNTER — Other Ambulatory Visit: Payer: Self-pay

## 2024-07-19 ENCOUNTER — Encounter: Admitting: Student

## 2024-07-19 DIAGNOSIS — I69354 Hemiplegia and hemiparesis following cerebral infarction affecting left non-dominant side: Secondary | ICD-10-CM | POA: Diagnosis not present

## 2024-07-19 DIAGNOSIS — I5042 Chronic combined systolic (congestive) and diastolic (congestive) heart failure: Secondary | ICD-10-CM | POA: Diagnosis not present

## 2024-07-19 DIAGNOSIS — I4891 Unspecified atrial fibrillation: Secondary | ICD-10-CM | POA: Diagnosis not present

## 2024-07-19 DIAGNOSIS — M869 Osteomyelitis, unspecified: Secondary | ICD-10-CM | POA: Diagnosis not present

## 2024-07-19 DIAGNOSIS — I69352 Hemiplegia and hemiparesis following cerebral infarction affecting left dominant side: Secondary | ICD-10-CM | POA: Diagnosis not present

## 2024-07-22 DIAGNOSIS — I5042 Chronic combined systolic (congestive) and diastolic (congestive) heart failure: Secondary | ICD-10-CM | POA: Diagnosis not present

## 2024-07-22 DIAGNOSIS — I69354 Hemiplegia and hemiparesis following cerebral infarction affecting left non-dominant side: Secondary | ICD-10-CM | POA: Diagnosis not present

## 2024-07-22 DIAGNOSIS — I4891 Unspecified atrial fibrillation: Secondary | ICD-10-CM | POA: Diagnosis not present

## 2024-07-22 DIAGNOSIS — M869 Osteomyelitis, unspecified: Secondary | ICD-10-CM | POA: Diagnosis not present

## 2024-07-22 DIAGNOSIS — I69352 Hemiplegia and hemiparesis following cerebral infarction affecting left dominant side: Secondary | ICD-10-CM | POA: Diagnosis not present

## 2024-07-23 ENCOUNTER — Encounter: Payer: Self-pay | Admitting: Student

## 2024-07-23 ENCOUNTER — Ambulatory Visit: Payer: Self-pay | Admitting: Student

## 2024-07-24 ENCOUNTER — Ambulatory Visit (INDEPENDENT_AMBULATORY_CARE_PROVIDER_SITE_OTHER): Payer: MEDICAID | Admitting: Student

## 2024-07-24 ENCOUNTER — Telehealth: Payer: Self-pay | Admitting: Student

## 2024-07-24 VITALS — BP 143/87 | HR 76 | Temp 98.0°F | Ht 69.0 in

## 2024-07-24 DIAGNOSIS — I10 Essential (primary) hypertension: Secondary | ICD-10-CM

## 2024-07-24 DIAGNOSIS — S069X9D Unspecified intracranial injury with loss of consciousness of unspecified duration, subsequent encounter: Secondary | ICD-10-CM

## 2024-07-24 DIAGNOSIS — I110 Hypertensive heart disease with heart failure: Secondary | ICD-10-CM | POA: Diagnosis not present

## 2024-07-24 DIAGNOSIS — G4733 Obstructive sleep apnea (adult) (pediatric): Secondary | ICD-10-CM

## 2024-07-24 DIAGNOSIS — G40209 Localization-related (focal) (partial) symptomatic epilepsy and epileptic syndromes with complex partial seizures, not intractable, without status epilepticus: Secondary | ICD-10-CM

## 2024-07-24 DIAGNOSIS — F4310 Post-traumatic stress disorder, unspecified: Secondary | ICD-10-CM | POA: Diagnosis not present

## 2024-07-24 DIAGNOSIS — M5416 Radiculopathy, lumbar region: Secondary | ICD-10-CM

## 2024-07-24 DIAGNOSIS — E1165 Type 2 diabetes mellitus with hyperglycemia: Secondary | ICD-10-CM

## 2024-07-24 DIAGNOSIS — I69398 Other sequelae of cerebral infarction: Secondary | ICD-10-CM | POA: Diagnosis not present

## 2024-07-24 DIAGNOSIS — F32A Depression, unspecified: Secondary | ICD-10-CM | POA: Diagnosis not present

## 2024-07-24 DIAGNOSIS — G40909 Epilepsy, unspecified, not intractable, without status epilepticus: Secondary | ICD-10-CM | POA: Diagnosis not present

## 2024-07-24 DIAGNOSIS — I5042 Chronic combined systolic (congestive) and diastolic (congestive) heart failure: Secondary | ICD-10-CM | POA: Diagnosis not present

## 2024-07-24 DIAGNOSIS — I4891 Unspecified atrial fibrillation: Secondary | ICD-10-CM | POA: Diagnosis not present

## 2024-07-24 DIAGNOSIS — E785 Hyperlipidemia, unspecified: Secondary | ICD-10-CM | POA: Diagnosis not present

## 2024-07-24 DIAGNOSIS — E1140 Type 2 diabetes mellitus with diabetic neuropathy, unspecified: Secondary | ICD-10-CM | POA: Diagnosis not present

## 2024-07-24 DIAGNOSIS — Z794 Long term (current) use of insulin: Secondary | ICD-10-CM

## 2024-07-24 MED ORDER — ACCU-CHEK GUIDE TEST VI STRP: ORAL_STRIP | 12 refills | Status: AC

## 2024-07-24 MED ORDER — METOPROLOL SUCCINATE ER 100 MG PO TB24: 100.0000 mg | ORAL_TABLET | Freq: Every day | ORAL | 0 refills | Status: DC

## 2024-07-24 MED ORDER — SPIRONOLACTONE 25 MG PO TABS: 25.0000 mg | ORAL_TABLET | Freq: Every day | ORAL | 3 refills | Status: AC

## 2024-07-24 MED ORDER — INSULIN GLARGINE 100 UNIT/ML ~~LOC~~ SOLN: 48.0000 [IU] | Freq: Every day | SUBCUTANEOUS | 16 refills | Status: DC

## 2024-07-24 MED ORDER — ESCITALOPRAM OXALATE 10 MG PO TABS: 10.0000 mg | ORAL_TABLET | Freq: Every day | ORAL | 3 refills | Status: AC

## 2024-07-24 MED ORDER — ALBUTEROL SULFATE HFA 108 (90 BASE) MCG/ACT IN AERS: 2.0000 | INHALATION_SPRAY | RESPIRATORY_TRACT | 0 refills | Status: AC | PRN

## 2024-07-24 MED ORDER — LACOSAMIDE 100 MG PO TABS: 200.0000 mg | ORAL_TABLET | Freq: Two times a day (BID) | ORAL | 11 refills | Status: DC

## 2024-07-24 MED ORDER — GERHARDT'S BUTT CREAM: 1.0000 | TOPICAL_CREAM | Freq: Two times a day (BID) | CUTANEOUS | Status: AC

## 2024-07-24 MED ORDER — ATORVASTATIN CALCIUM 40 MG PO TABS: 40.0000 mg | ORAL_TABLET | Freq: Every day | ORAL | 3 refills | Status: DC

## 2024-07-24 MED ORDER — ACCU-CHEK FASTCLIX LANCETS MISC: 2 refills | Status: DC

## 2024-07-24 MED ORDER — APIXABAN 5 MG PO TABS: 5.0000 mg | ORAL_TABLET | Freq: Two times a day (BID) | ORAL | 5 refills | Status: DC

## 2024-07-24 MED ORDER — ISOSORB DINITRATE-HYDRALAZINE 20-37.5 MG PO TABS: 1.0000 | ORAL_TABLET | Freq: Three times a day (TID) | ORAL | 11 refills | Status: AC

## 2024-07-24 MED ORDER — ACCU-CHEK GUIDE W/DEVICE KIT: PACK | 0 refills | Status: AC

## 2024-07-24 MED ORDER — LEVETIRACETAM 500 MG PO TABS: 500.0000 mg | ORAL_TABLET | Freq: Two times a day (BID) | ORAL | 3 refills | Status: DC

## 2024-07-24 MED ORDER — METOPROLOL TARTRATE 75 MG PO TABS: 75.0000 mg | ORAL_TABLET | Freq: Two times a day (BID) | ORAL | 5 refills | Status: DC

## 2024-07-24 NOTE — Telephone Encounter (Signed)
 RTC to patient stated that she has lost her [facility] as well as her transportation.  Unable to get her medications as well.  Patient was informed that she will need to come in for an appointment with her PCP so that needs can be determined for her and labs prior to refills on ,medications that she has been on in the past.  Patient unsure as to the medications that she was changed to while she was in the Nursing Home.  Patient was on the line with [facility] from [facility] who stated that patient was on emergency services since her discharge from the Nursing home and those services have expired.  Patient was again informed that she will need to come in for an appointment with the PCP so that her services can be restarted again.  Patient stated she had transportation.  Patient was reminded that she has Medicaid and transportation is provided through them.  Patient to call a friend to get her to the Clinics for an appointment today or as soon as possible.  Patient was given an appointment for 3:15 PM today.

## 2024-07-24 NOTE — Assessment & Plan Note (Signed)
 TTE in 2023 with EF of 25-30%.  Managed with Lopressor  which is now exchanged for Toprol -XL per above and Aldactone  25 mg daily.  No SGLT2-I per above.  No Entresto given history of angioedema though cardiology had considered trial of losartan.  She has chronic SOB though symptoms are no worse than baseline.  Physical exam shows 1+ bilateral LEE.  Lungs are clear.  She has been off all meds for the past 3 days.  She will follow-up with cardiology in addition to above management.

## 2024-07-24 NOTE — Assessment & Plan Note (Signed)
 Repeat lipid panel today.  Managed with Lipitor 40 mg daily.  Goal LDL less than 55.
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
## 2024-07-24 NOTE — Patient Instructions (Addendum)
 Please call [facility] and make a follow-up appointment ASAP.  [phone]
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
## 2024-07-24 NOTE — Assessment & Plan Note (Signed)
 Regular rate and rhythm today.  Denies palpitations/SOB.  Rate controlled with Lopressor  75 mg twice daily and on Eliquis  5 mg twice daily.  Given HFrEF, Lopressor  exchanged for Toprol -XL 100 mg daily with plan to uptitrate if BP/HR allow.  Today her HR is 78 and 76 and she has been off all medications including Lopressor  for 3 days.  Gave her log today to record BP and HR and bring this to follow-up next week.   She was previously on Xarelto  20 mg daily for A-fib, but I am unsure why this was discontinued as I do not see a history of failed Xarelto .  I have provided contact information to re-establish with heart care as she previously followed with him, most recently in 2024.

## 2024-07-24 NOTE — Assessment & Plan Note (Signed)
 Refilled Lexapro  10 mg daily today.  Denies SI/HI.  [facility] referral sent.

## 2024-07-24 NOTE — Assessment & Plan Note (Signed)
 Referral sent to [facility]

## 2024-07-24 NOTE — Assessment & Plan Note (Addendum)
 Will check A1c today.  It was 11.3 in 02/2024.  Currently managed with Lantus  48 units daily.  Not on SGLT2 inhibitor given previous history of ESBL UTI.  Will further clarify plan based on A1c at follow-up next week.  She would also benefit from a CGM, and I have sent a referral to [facility] for diabetes coordination.  BMP, ophthalmology referral sent.  Attempted to get microalbumin: Creatinine but unfortunately patient spilled urine and sample was unable to be obtained so we will get this next week at follow-up.
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
## 2024-07-24 NOTE — Assessment & Plan Note (Signed)
 Not currently on CPAP as she was told by insurance company she would need repeat sleep study which I have sent today.

## 2024-07-24 NOTE — Assessment & Plan Note (Signed)
 BP is 152/93 and 143/87.  She has been off all antihypertensives for the past 3 days.  Previously managed with Lopressor  75 mg twice daily which I have modified per above.  Also on BiDil  20-37.5 3 times daily.  Will follow-up next week based on ambulatory readings and plan accordingly.

## 2024-07-25 ENCOUNTER — Telehealth: Payer: Self-pay | Admitting: *Deleted

## 2024-07-25 ENCOUNTER — Other Ambulatory Visit: Payer: Self-pay | Admitting: Student

## 2024-07-25 ENCOUNTER — Ambulatory Visit: Payer: Self-pay | Admitting: Student

## 2024-07-25 LAB — BASIC METABOLIC PANEL WITH GFR
BUN/Creatinine Ratio: 11 (ref 9–23)
BUN: 10 mg/dL (ref 6–24)
CO2: 21 mmol/L (ref 20–29)
Calcium: 9.2 mg/dL (ref 8.7–10.2)
Chloride: 97 mmol/L (ref 96–106)
Creatinine, Ser: 0.95 mg/dL (ref 0.57–1.00)
Glucose: 388 mg/dL — ABNORMAL HIGH (ref 70–99)
Potassium: 4 mmol/L (ref 3.5–5.2)
Sodium: 136 mmol/L (ref 134–144)
eGFR: 70 mL/min/1.73 (ref >59)

## 2024-07-25 LAB — HEMOGLOBIN A1C
Est. average glucose Bld gHb Est-mCnc: 212 mg/dL
Hgb A1c MFr Bld: 9 % — ABNORMAL HIGH (ref 4.8–5.6)

## 2024-07-25 LAB — LIPID PANEL
Chol/HDL Ratio: 4.2 ratio (ref 0.0–4.4)
Cholesterol, Total: 191 mg/dL (ref 100–199)
HDL: 45 mg/dL (ref >39)
LDL Chol Calc (NIH): 103 mg/dL — ABNORMAL HIGH (ref 0–99)
Triglycerides: 254 mg/dL — ABNORMAL HIGH (ref 0–149)
VLDL Cholesterol Cal: 43 mg/dL — ABNORMAL HIGH (ref 5–40)

## 2024-07-25 MED ORDER — INSULIN GLARGINE SOLOSTAR 100 UNIT/ML ~~LOC~~ SOPN: 48.0000 [IU] | PEN_INJECTOR | Freq: Every day | SUBCUTANEOUS | 10 refills | Status: DC

## 2024-07-25 MED ORDER — ROSUVASTATIN CALCIUM 40 MG PO TABS: 40.0000 mg | ORAL_TABLET | Freq: Every day | ORAL | 3 refills | Status: AC

## 2024-07-25 MED ORDER — CYCLOBENZAPRINE HCL 10 MG PO TABS: 10.0000 mg | ORAL_TABLET | Freq: Two times a day (BID) | ORAL | 1 refills | Status: DC | PRN

## 2024-07-25 NOTE — Telephone Encounter (Signed)
 Copied from CRM #[id number]. Topic: Clinical - Medication Question >> Jul 25, 2024 11:48 AM Cherylann RAMAN wrote: Reason for CRM: [facility], called to see if Dr. Norman Lobstein would recommend a different prescription for insulin  glargine (LANTUS ) 100 UNIT/ML injection due having to draw the medication out of a vile. They are recommending a pen. Please contact Clement at [phone] additional information.

## 2024-07-25 NOTE — Progress Notes (Signed)
 Internal Medicine Clinic Attending  Case discussed with the resident at the time of the visit.  We reviewed the resident's history and exam and pertinent patient test results.  I agree with the assessment, diagnosis, and plan of care documented in the resident's note.

## 2024-07-30 ENCOUNTER — Other Ambulatory Visit (HOSPITAL_COMMUNITY): Payer: Self-pay

## 2024-07-30 ENCOUNTER — Telehealth: Payer: Self-pay | Admitting: *Deleted

## 2024-07-30 ENCOUNTER — Telehealth: Payer: Self-pay

## 2024-07-30 NOTE — Telephone Encounter (Signed)
 Prior Authorization for patient ([employer]  (yfgn) 100UNIT/ML pen-injectors) came through on cover my meds was submitted with last office notes and labs awaiting approval or denial.  [id number]

## 2024-07-30 NOTE — Telephone Encounter (Signed)
 Copied from CRM #[id number]. Topic: General - Other >> Jul 30, 2024 12:54 PM Mercer PEDLAR wrote: Reason for CRM: Rico, nurse with [facility] health resources calling to speak with PCP nurse to follow up on cognitive status and ability to live independently.   Callback: [phone]

## 2024-07-30 NOTE — Telephone Encounter (Unsigned)
 Copied from CRM #[id number]. Topic: Clinical - Order For Equipment >> Jul 30, 2024 11:39 AM Laurier BROCKS wrote: Reason for CRM: Patient is needing incontinence supplies. She states she used to get supplies [facility]. Patient is needing the following: Briefs,Tucks (Bed pads) & Feminine Pads. Please give patient a call back at [phone] if additional info is needed.

## 2024-07-30 NOTE — Telephone Encounter (Signed)
 Per test claim:   Brand Lantus  solostar and brand basglar are covered... the pharmacy needs to change RX to brand name or the provider can resend it for brand name of one of these insulins. Actually not sure why they filled it for Semglee !

## 2024-07-30 NOTE — Telephone Encounter (Signed)
 Return call to [facility] RN,[employer] - stated pt is in their Transition to [facility]. It helps people w/mental /cognitive disabilities to live in the community. Stated she wants to know if pt is still able to live independently mentally and cognitively or will she need a high level of assistance? Pt was seen 8/12 and has an appt tomorrow - sending to her PCP.

## 2024-07-31 ENCOUNTER — Ambulatory Visit (INDEPENDENT_AMBULATORY_CARE_PROVIDER_SITE_OTHER): Payer: MEDICAID | Admitting: Student

## 2024-07-31 VITALS — BP 124/67 | HR 72 | Temp 97.5°F | Ht 69.0 in | Wt 250.6 lb

## 2024-07-31 DIAGNOSIS — M47816 Spondylosis without myelopathy or radiculopathy, lumbar region: Secondary | ICD-10-CM

## 2024-07-31 DIAGNOSIS — M5416 Radiculopathy, lumbar region: Secondary | ICD-10-CM

## 2024-07-31 DIAGNOSIS — Z794 Long term (current) use of insulin: Secondary | ICD-10-CM | POA: Diagnosis not present

## 2024-07-31 DIAGNOSIS — G40909 Epilepsy, unspecified, not intractable, without status epilepticus: Secondary | ICD-10-CM

## 2024-07-31 DIAGNOSIS — I5042 Chronic combined systolic (congestive) and diastolic (congestive) heart failure: Secondary | ICD-10-CM

## 2024-07-31 DIAGNOSIS — M1712 Unilateral primary osteoarthritis, left knee: Secondary | ICD-10-CM

## 2024-07-31 DIAGNOSIS — E1165 Type 2 diabetes mellitus with hyperglycemia: Secondary | ICD-10-CM | POA: Diagnosis present

## 2024-07-31 DIAGNOSIS — E1140 Type 2 diabetes mellitus with diabetic neuropathy, unspecified: Secondary | ICD-10-CM

## 2024-07-31 DIAGNOSIS — I69354 Hemiplegia and hemiparesis following cerebral infarction affecting left non-dominant side: Secondary | ICD-10-CM

## 2024-07-31 MED ORDER — FREESTYLE LIBRE 3 PLUS SENSOR MISC: 3 refills | Status: AC

## 2024-07-31 MED ORDER — DICLOFENAC SODIUM 1 % EX GEL: 4.0000 g | Freq: Four times a day (QID) | CUTANEOUS | 1 refills | Status: DC | PRN

## 2024-07-31 MED ORDER — LANTUS SOLOSTAR 100 UNIT/ML ~~LOC~~ SOPN: 48.0000 [IU] | PEN_INJECTOR | Freq: Every day | SUBCUTANEOUS | 11 refills | Status: DC

## 2024-07-31 MED ORDER — GABAPENTIN 300 MG PO CAPS: 300.0000 mg | ORAL_CAPSULE | Freq: Three times a day (TID) | ORAL | 5 refills | Status: AC

## 2024-07-31 NOTE — Patient Instructions (Addendum)
 Cardiology Insulin  Hanger BP cuff and measure blood pressure and HR

## 2024-07-31 NOTE — Progress Notes (Unsigned)
 CC: Follow-up  HPI:  [name] is a 58 y.o. female living with a history stated below and presents today for follow-up. Please see problem based assessment and plan for additional details.  Past Medical History:  Diagnosis Date   Acute cystitis 06/19/2021   AKI (acute kidney injury) (HCC) 09/13/2022   AKI (acute kidney injury) (HCC) 09/13/2022   Allergic rhinitis    Arthritis    hips, back, legs, arms (07/04/2014)   Asthma    hx   Automatic implantable cardioverter-defibrillator in situ    Calcifying tendinitis of shoulder    Chronic combined systolic and diastolic CHF (congestive heart failure) (HCC)    EF 40-45% by echo 12/06/2012   Chronic diastolic heart failure (HCC)     Primarily diastolic CHF: Likely due to uncontrolled HTN. Last echo (8/12) with EF 45-50%, mild to moderate LVH with some asymmetric septal hypertrophy, RV normal size and systolic function. EF 50-55% by LV-gram in 6/12.    Chronic lower back pain    secondary to DJD, obsetiy, hip problems. Followed by Dr. Darlyn Gunther (pain management)   Chronic systolic heart failure (HCC) 05/17/2022   Chronic use of opiate for therapeutic purpose 12/14/2016   Contact with and (suspected) exposure to covid-19 02/07/2020   Coronary artery disease    questionable. LHC 05/2011 showing normal coronaries // Followed at [facility], Dr. Rolan   Cough 11/02/2022   Degeneration of lumbar or lumbosacral intervertebral disc    Diabetic foot infection (HCC) 03/05/2023   DJD (degenerative joint disease) of hip    right sided   Fatigue 04/27/2022   Frequent UTI    GERD (gastroesophageal reflux disease)    HLD (hyperlipidemia)    Hypertension    Poorly controlled. Has had HTN since age 89. Angioedema with ACEI.  24 Hr urine and renal arterial dopplers ordered . . . 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   Presence of permanent cardiac pacemaker    Rash 07/14/2016   Seizures (HCC)    last 3 months   Shortness of breath    none now   Stroke [facility]) 12/2013   my left side is paralyzed (07/04/2014)   Tachycardia 07/04/2014   Thoracic or lumbosacral neuritis or radiculitis, unspecified    Tinea cruris 09/13/2022   Type II diabetes mellitus (HCC) DX: 2002   Urinary frequency 11/05/2014   Vaginal discharge 02/05/2021    Current Outpatient Medications on File Prior to Visit  Medication Sig Dispense Refill   Accu-Chek FastClix Lancets MISC Check blood sugar 3 times a day 102 each 2   albuterol  (VENTOLIN  HFA) 108 (90 Base) MCG/ACT inhaler Inhale 2 puffs into the lungs every 4 (four) hours as needed for wheezing or shortness of breath. 36 g 0   apixaban  (ELIQUIS ) 5 MG TABS tablet Take 1 tablet (5 mg total) by mouth 2 (two) times daily.  120 tablet 5   ascorbic acid  (VITAMIN C ) 500 MG tablet Take 2 tablets (1,000 mg total) by mouth daily. 100 tablet 0   Blood Glucose Monitoring Suppl (ACCU-CHEK GUIDE) w/Device KIT Use As Directed 1 kit 0   cyanocobalamin  1000 MCG tablet Take 1 tablet by mouth daily.     cyclobenzaprine  (FLEXERIL ) 10 MG tablet Take 1 tablet (10 mg total) by mouth 2 (two) times daily as needed for muscle spasms. 60 tablet 1   escitalopram  (LEXAPRO ) 10 MG tablet Take 1 tablet (10 mg total) by mouth daily. 90 tablet 3   famotidine  (PEPCID ) 20 MG tablet Take 1 tablet (20 mg total) by mouth 2 (two) times daily. 60 tablet 3   feeding supplement (ENSURE ENLIVE  / ENSURE PLUS) LIQD Take 237 mLs by mouth 2 (two) times daily between meals.     glucose blood (ACCU-CHEK GUIDE TEST) test strip Use as instructed 100 each 12   GNP ULTICARE PEN NEEDLES 31G X 8 MM MISC Inject 1 each into the skin 4 (four) times daily. 200 each 0   isosorbide -hydrALAZINE  (BIDIL ) 20-37.5 MG tablet Take 1 tablet by mouth 3 (three) times daily. 90 tablet 11   Lacosamide  (VIMPAT ) 100 MG TABS Take 2 tablets (200 mg total) by mouth 2 (two) times daily. 120 tablet 11   lacosamide  (VIMPAT ) 200 MG TABS tablet Take 1 tablet (200 mg total) by mouth 2 (two) times daily. 180 tablet 3   levETIRAcetam  (KEPPRA ) 500 MG tablet Take 1 tablet (500 mg total) by mouth 2 (two) times daily. 180 tablet 3   LINZESS  290 MCG CAPS capsule TAKE ONE CAPSULE BY MOUTH EVERY DAY AS NEEDED FOR CONSTIPATION 30 capsule 3   melatonin 3 MG TABS tablet Take 3 mg by mouth at bedtime.     metoprolol  succinate (TOPROL  XL) 100 MG 24 hr tablet Take 1 tablet (100 mg total) by mouth daily. Take with or immediately following a meal. 30 tablet 0   nutrition supplement, JUVEN, (JUVEN) PACK Take 1 packet by mouth 2 (two) times daily between meals.     Nystatin  (GERHARDT'S BUTT CREAM) CREA Apply 1 Application topically 2 (two) times daily.     polyethylene glycol (MIRALAX  / GLYCOLAX ) 17 g packet Take 17 g by mouth daily as needed.     potassium chloride  SA (KLOR-CON  M20) 20 MEQ tablet Take 1 tablet (20 mEq total) by mouth daily. 30 tablet 0   PREMARIN  vaginal cream Place 0.5 (1gram) Applicatorfuls vaginally 3 (three) times a week. ** This is 70 DAYS supply ** 30 g 2   psyllium (HYDROCIL/METAMUCIL) 95 % PACK Take 1 packet by mouth daily.     rosuvastatin  (CRESTOR ) 40 MG tablet Take 1 tablet (40 mg total) by mouth daily. 90 tablet 3   senna (SENOKOT) 8.6 MG TABS tablet Take 1 tablet (8.6 mg total) by mouth 2 (two) times daily as needed for moderate constipation.     spironolactone  (ALDACTONE ) 25 MG tablet Take 1 tablet (25 mg total) by  mouth daily. 90 tablet 3   talc  powder Apply topically as needed. 240 g 0   Vitamin D , Ergocalciferol , (DRISDOL ) 1.25 MG (50000 UNIT) CAPS capsule TAKE ONE CAPSULE BY MOUTH EVERY 7 DAYS 5 capsule 5   Zinc  Sulfate 220 (50 Zn) MG TABS Take 1 tablet (220 mg total) by mouth daily. 100 tablet 0   No current facility-administered medications on file prior to visit.    Family History  Problem Relation Age of Onset  Heart disease Mother 21       Died of MI at age 9 yo   Kidney disease Mother        requiring dialysis   Congestive Heart Failure Mother    Heart disease Father 74       MI age 46yo requiring stenting   Diabetes Father    Glaucoma Father    Heart disease Paternal Grandmother        requiring pacemaker.   Heart disease Paternal Grandfather 40       Died of MI at possibly age 53-53yo   Stroke Paternal Grandfather    Diabetes Brother    Heart disease Brother 34       MI at age 81 years old   Breast cancer Paternal Aunt    Breast cancer Maternal Grandmother     Social History   Socioeconomic History   Marital status: Single    Spouse name: Not on file   Number of children: 2   Years of education: 9th grade   Highest education level: Not on file  Occupational History   Occupation: Unemployed    Comment: planning on getting disability  Tobacco Use   Smoking status: Never    Passive exposure: Current   Smokeless tobacco: Never  Vaping Use   Vaping status: Never Used  Substance and Sexual Activity   Alcohol  use: No    Alcohol /week: 0.0 standard drinks of alcohol    Drug use: No   Sexual activity: Yes    Partners: Male    Birth control/protection: Surgical, Post-menopausal    Comment: menarche 58yo, sexual debut 58yo  Other Topics Concern   Not on file  Social History Narrative   Lives in [place] with her son. Is able to read and write fluently in [place].   Social Drivers of Health   Financial Resource Strain: Medium Risk (03/23/2023)   Overall Financial  Resource Strain (CARDIA)    Difficulty of Paying Living Expenses: Somewhat hard  Food Insecurity: No Food Insecurity (03/02/2024)   Hunger Vital Sign    Worried About Running Out of Food in the Last Year: Never true    Ran Out of Food in the Last Year: Never true  Transportation Needs: Unmet Transportation Needs (03/02/2024)   PRAPARE - Administrator, Civil Service (Medical): Yes    Lack of Transportation (Non-Medical): Yes  Physical Activity: Insufficiently Active (03/23/2023)   Exercise Vital Sign    Days of Exercise per Week: 3 days    Minutes of Exercise per Session: 20 min  Stress: Not on file  Social Connections: Moderately Isolated (03/02/2024)   Social Connection and Isolation Panel    Frequency of Communication with Friends and Family: More than three times a week    Frequency of Social Gatherings with Friends and Family: Three times a week    Attends Religious Services: More than 4 times per year    Active Member of Clubs or Organizations: No    Attends Banker Meetings: Never    Marital Status: Never married  Intimate Partner Violence: Not At Risk (03/02/2024)   Humiliation, Afraid, Rape, and Kick questionnaire    Fear of Current or Ex-Partner: No    Emotionally Abused: No    Physically Abused: No    Sexually Abused: No    Review of Systems: ROS negative except for what is noted on the assessment and plan.  Vitals:   07/31/24 1428 07/31/24 1502  BP: (!) 175/88  124/67  Pulse: 70 72  Temp: (!) 97.5 F (36.4 C)   TempSrc: Oral   SpO2: 97%   Weight: 250 lb 9.6 oz (113.7 kg)   Height: 5' 9 (1.753 m)     Physical Exam: Constitutional: sitting in wheelchair, in no acute distress HENT: left facial droop (chronic) Cardiovascular: regular rate and rhythm, no m/r/g, bilateral 1+ LEE Pulmonary/Chest: normal work of breathing on room air, lungs clear to auscultation bilaterally MSK: left-sided knee brace/splint Skin: warm and dry Psych: normal  mood and behavior  Assessment & Plan:     Patient discussed with Dr. Shawn  Chronic combined systolic and diastolic heart failure (HCC) TTE in 2023 with EF of 25-30%.  Lopressor  75 BID exchanged for Toprol -XL 100 mg daily last week and also takes Aldactone  25 mg daily.  No SGLT2-I per above.  No Entresto given history of angioedema though cardiology had considered trial of losartan. No issues since switching to Toprol -XL. She unfortunately did not record ambulatory readings but was reminded to do so today. Will bring at-home BP's and HR's to follow-up. Plan Toprol -XL accordingly. She will follow-up with cardiology in addition to above management.   Poorly controlled type 2 diabetes mellitus with neuropathy (HCC) Currently managed with Lantus  48 units daily though unfortunately she has been without this for two weeks as insurance only covers brand name Solostar (this was rectified today).  Not on SGLT2 inhibitor given previous history of ESBL UTI.  She would also benefit from a CGM which I have sent, and I have also sent a referral to [facility] for diabetes coordination. Microalbumin:Creatinine today.    Norman Lobstein, D.O. [facility] Internal Medicine, PGY-2 Phone: [phone] Date 08/01/2024 Time 7:38 PM

## 2024-08-01 NOTE — Assessment & Plan Note (Signed)
 Referral sent for [facility] PT today as

## 2024-08-01 NOTE — Assessment & Plan Note (Signed)
 Currently managed with Lantus  48 units daily though unfortunately she has been without this for two weeks as insurance only covers brand name Solostar (this was rectified today).  Not on SGLT2 inhibitor given previous history of ESBL UTI.  She would also benefit from a CGM which I have sent, and I have also sent a referral to [facility] for diabetes coordination. Microalbumin:Creatinine today.

## 2024-08-01 NOTE — Assessment & Plan Note (Signed)
 TTE in 2023 with EF of 25-30%.  Lopressor  75 BID exchanged for Toprol -XL 100 mg daily last week and also takes Aldactone  25 mg daily.  No SGLT2-I per above.  No Entresto given history of angioedema though cardiology had considered trial of losartan. No issues since switching to Toprol -XL. She unfortunately did not record ambulatory readings but was reminded to do so today. Will bring at-home BP's and HR's to follow-up. Plan Toprol -XL accordingly. She will follow-up with cardiology in addition to above management.

## 2024-08-02 LAB — MICROALBUMIN / CREATININE URINE RATIO
Creatinine, Urine: <4.2 mg/dL
Microalbumin, Urine: 98.6 ug/mL
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
## 2024-08-02 NOTE — Progress Notes (Signed)
 Internal Medicine Clinic Attending  Case discussed with the resident at the time of the visit.  We reviewed the resident's history and exam and pertinent patient test results.  I agree with the assessment, diagnosis, and plan of care documented in the resident's note.

## 2024-08-06 ENCOUNTER — Telehealth: Payer: Self-pay | Admitting: *Deleted

## 2024-08-06 NOTE — Telephone Encounter (Signed)
 Copied from CRM #[id number]. Topic: Clinical - Order For Equipment >> Aug 06, 2024 11:20 AM Graeme ORN wrote: Reason for CRM: Patient called states provider had her reach out to [facility] clinic. She spoke with them and they need Rx /Order for the brace for her leg that PCP wants her to have. Thank You

## 2024-08-06 NOTE — Telephone Encounter (Unsigned)
 Copied from CRM #[id number]. Topic: Clinical - Order For Equipment >> Aug 06, 2024 11:20 AM Graeme ORN wrote: Reason for CRM: Patient called states provider had her reach out to [facility] clinic. She spoke with them and they need Rx /Order for the brace for her leg that PCP wants her to have. Thank You

## 2024-08-07 ENCOUNTER — Ambulatory Visit: Payer: Self-pay | Admitting: Student

## 2024-08-07 DIAGNOSIS — S069X9A Unspecified intracranial injury with loss of consciousness of unspecified duration, initial encounter: Secondary | ICD-10-CM

## 2024-08-07 DIAGNOSIS — I69354 Hemiplegia and hemiparesis following cerebral infarction affecting left non-dominant side: Secondary | ICD-10-CM

## 2024-08-07 DIAGNOSIS — N3946 Mixed incontinence: Secondary | ICD-10-CM

## 2024-08-08 NOTE — Telephone Encounter (Signed)
 Received another call from Rico Cha nurse with [facility]. She wants to know if the doctor who will see the pt on 9/3 could do a cognitive status assessment  and order an neuropsych evaluation if needed. Stated she did a Office manager, pt scored #10 which means mild impairment also pt  had a SLUMS exam which showed mild impairment. I told her I will pass this along to the doctor.

## 2024-08-08 NOTE — Telephone Encounter (Unsigned)
 Copied from CRM [id number]. Topic: General - Other >> Aug 08, 2024  9:57 AM Farrel B wrote: Reason for CRM: [id number]; patient has called to speak with someone in regards to her incontinence supplies, patient states she needs a call back to update her on how to go about getting them or what does she need to do.

## 2024-08-08 NOTE — Telephone Encounter (Signed)
 Received call from Rico Cha w/ [facility], requesting an update; Informed her of Dr Francesca response. She's requesting latest office note, Dr Francesca response and current med list in order to better assist pt. I will ask [facility] to fax requested info to S. Cha.

## 2024-08-14 ENCOUNTER — Telehealth (INDEPENDENT_AMBULATORY_CARE_PROVIDER_SITE_OTHER): Payer: Self-pay | Admitting: Licensed Clinical Social Worker

## 2024-08-14 ENCOUNTER — Ambulatory Visit: Payer: Self-pay | Admitting: Licensed Clinical Social Worker

## 2024-08-14 ENCOUNTER — Telehealth: Payer: Self-pay | Admitting: *Deleted

## 2024-08-14 DIAGNOSIS — F32A Depression, unspecified: Secondary | ICD-10-CM

## 2024-08-14 NOTE — Addendum Note (Signed)
 Addended by: ISOBEL SPIRES on: 08/14/2024 04:37 PM   Modules accepted: Orders

## 2024-08-14 NOTE — Addendum Note (Signed)
 Addended by: ISOBEL SPIRES on: 08/14/2024 04:34 PM   Modules accepted: Orders

## 2024-08-14 NOTE — Telephone Encounter (Signed)
 [facility] attempted patient for 9am appointment. [facility] unable to leave a VM as the VM is not set up.  Renda Pontes, MSW, LCSW-A She/Her Behavioral Health Clinician [facility]  Internal Medicine Center

## 2024-08-14 NOTE — BH Specialist Note (Signed)
 Patient no-showed today's appointment; appointment was for Telephone visit at 9:00am  Behavioral Health Clinician [facility] from here on out)  attempted patient  via Telephone. [facility] unable to leave a VM. VM is not set up.  Patient will need to reschedule appointment by calling Internal medicine center [phone].  Renda Pontes, MSW, LCSW-A She/Her Behavioral Health Clinician [facility]  Internal Medicine Center

## 2024-08-14 NOTE — Telephone Encounter (Signed)
 RTC to [facility] x 2 Number given not working.  Unable to leave message.  Will inform doctor that [facility] will not start until 08/15/2024 for patient.  Copied from CRM [id number]. Topic: Clinical - Medical Advice >> Aug 10, 2024  2:06 PM Laurier BROCKS wrote: Caller: O[facility] Lenny Corean Salome [phone] Reason for CRM: [facility] called to inform the provider there has been a delay in care for the patients verbal orders. They will be starting care on 08/15/24 and would like a verbal ok to do so. Please call Olam back at the above #

## 2024-08-15 ENCOUNTER — Ambulatory Visit (INDEPENDENT_AMBULATORY_CARE_PROVIDER_SITE_OTHER): Admitting: Dietician

## 2024-08-15 ENCOUNTER — Ambulatory Visit (INDEPENDENT_AMBULATORY_CARE_PROVIDER_SITE_OTHER)

## 2024-08-15 ENCOUNTER — Telehealth: Payer: Self-pay | Admitting: *Deleted

## 2024-08-15 ENCOUNTER — Other Ambulatory Visit: Payer: Self-pay | Admitting: Dietician

## 2024-08-15 VITALS — BP 181/100 | HR 76 | Temp 97.4°F | Ht 69.0 in

## 2024-08-15 DIAGNOSIS — E1165 Type 2 diabetes mellitus with hyperglycemia: Secondary | ICD-10-CM | POA: Diagnosis not present

## 2024-08-15 DIAGNOSIS — Z59819 Housing instability, housed unspecified: Secondary | ICD-10-CM | POA: Diagnosis not present

## 2024-08-15 DIAGNOSIS — Z794 Long term (current) use of insulin: Secondary | ICD-10-CM

## 2024-08-15 MED ORDER — ACCU-CHEK SOFTCLIX LANCETS MISC: 12 refills | Status: AC

## 2024-08-15 NOTE — Telephone Encounter (Signed)
 Patient requested a meter. Note one was recently sent in but with incorrect lancets. Request correct lancets.  Pharmacy says Medicaid was and is not covering her testing supplies. They say she is in an institution-. 29.99 if she uses Community education officer. She needs to be reminded to call her medicaid case worker and have the institution part removed for [place] Medicaid to pay.   I reminded patient today to contact her Medicaid case worker and she verbalized understanding.

## 2024-08-15 NOTE — Patient Instructions (Addendum)
 Thank you, [name] for allowing us  to provide your care today. Today we discussed your cognitive status and your safety with living alone. I would like you to come back to the office next week on Thursday or Friday with [name] and [name] to help come up with a plan for you to live alone.     My Chart Access: [email or web address]  Please follow-up in: 1 week to follow up on diabetes.    We look forward to seeing you next time. Please call our clinic at [phone] if you have any questions or concerns. The best time to call is Monday-Friday from 9am-4pm, but there is someone available 24/7. If after hours or the weekend, call the main hospital number and ask for the Internal Medicine Resident On-Call. If you need medication refills, please notify your pharmacy one week in advance and they will send us  a request.   Thank you for letting us  take part in your care. Wishing you the best!  Caya Soberanis, DO 08/15/2024, 1:33 PM [name]

## 2024-08-15 NOTE — Progress Notes (Signed)
 Diabetes Self-Management Education  Visit Type: First/Initial (last visit was 2020)  Appt. Start Time: 108 Appt. End Time: 208  08/15/2024  [name], identified by name and date of birth, is a 58 y.o. female with a diagnosis of Diabetes: Type 2.   ASSESSMENT  Last menstrual period 09/13/2019. There is no height or weight on file to calculate BMI.   Diabetes Self-Management Education - 08/15/24 1500       Initial Visit   Diabetes Type Type 2    Are you currently following a meal plan? Yes    Are you taking your medications as prescribed? Yes      Psychosocial Assessment   Self-care barriers Lack of transportation;Unsteady gait/risk for falls;Debilitated state due to current medical condition;Lack of material resources    Self-management support Family;Doctor's office;CDE visits    Patient Concerns Problem Solving;Glycemic Control;Monitoring    Special Needs None    Preferred Learning Style No preference indicated    Learning Readiness Ready    How often do you need to have someone help you when you read instructions, pamphlets, or other written materials from your doctor or pharmacy? 2 - Rarely    What is the last grade level you completed in school? 12      Pre-Education Assessment   Patient understands the diabetes disease and treatment process. Comprehends key points    Patient understands incorporating nutritional management into lifestyle. Needs Review    Patient undertands incorporating physical activity into lifestyle. Comprehends key points    Patient understands using medications safely. Comprehends key points    Patient understands monitoring blood glucose, interpreting and using results Needs Instruction    Patient understands prevention, detection, and treatment of acute complications. Needs Review    Patient understands prevention, detection, and treatment of chronic complications. Compreheands key points    Patient understands how to develop strategies to  address psychosocial issues. Comprehends key points    Patient understands how to develop strategies to promote health/change behavior. Comprehends key points      Complications   Last HgB A1C per patient/outside source 9 %    How often do you check your blood sugar? 0 times/day (not testing)   does not ahve a meter- lost a lot while in care. CGM placed today   Fasting Blood glucose range (mg/dL) >799   731 today fasting   Postprandial Blood glucose range (mg/dL) >799    Number of hypoglycemic episodes per month 0   last time she had a low was about 2 months ago. she did not rembmer the specific cirsumstances   Number of hyperglycemic episodes ( >200mg /dL): Daily    Can you tell when your blood sugar is high? --   deferred   Have you had a dilated eye exam in the past 12 months? No    Have you had a dental exam in the past 12 months? No    Are you checking your feet? --   deferred until follow up     Dietary Intake   Breakfast cornflakes and banana    Lunch [place] wings and kale      Activity / Exercise   Activity / Exercise Type ADL's   left hemiparesis- in a wheelchair     Patient Education   Previous Diabetes Education Yes   here   Healthy Eating Role of diet in the treatment of diabetes and the relationship between the three main macronutrients and blood glucose level    Monitoring  Taught/evaluated CGM (comment)   this was her priority for today's visit.   Acute complications Discussed and identified patients' prevention, symptoms, and treatment of hyperglycemia.      Individualized Goals (developed by patient)   Monitoring  Consistenly use CGM      Post-Education Assessment   Patient understands incorporating nutritional management into lifestyle. Comprehends key points    Patient understands monitoring blood glucose, interpreting and using results Comprehends key points      Outcomes   Expected Outcomes Demonstrated interest in learning but significant barriers to change     Future DMSE 2 wks    Program Status Not Completed          Individualized Plan for Diabetes Self-Management Training:   Learning Objective:  Patient will have a greater understanding of diabetes self-management. Patient education plan is to attend individual and/or group sessions per assessed needs and concerns.   Plan:   Patient Instructions  Thank you for your visit today! I am in awe of your determination! Wow!  You placed a Freestyle libre 3 plus Continuous glucose monitor on your left arm today and started it with your phone.   Keep your phone near you - even when sleeping,  Dismiss alarms and try to fix why they were alarming- high or low.   Try to keep your blood sugar in the green target zone as much as possible.   Smaller portions of starchy and sugary foods if often helpful. (Watch out for cold cereal and bananas...)  Please make a follow up in 2-3 weeks and bring a sensor with oiuy if you want help placing a new one.   You are connected to us  remotely so you can call in to discuss your blood sugars in a phone visit.   Arland [phone] (new number)   Expected Outcomes:  Demonstrated interest in learning but significant barriers to change  Education material provided: Diabetes Resources  If problems or questions, patient to contact team via:  Phone  Future DSME appointment: 2 wks Arland Hole, RD 08/15/2024 3:14 PM.

## 2024-08-15 NOTE — Progress Notes (Signed)
 Established Patient Office Visit  Subjective   Patient ID: [name], female    DOB: 1966/05/04  Age: 58 y.o. MRN: [id number]  No chief complaint on file.  [name] is a 58 year old female with a past medical history of HTN, HFrEF (EF 25-30% in 2023), atrial fibrillation on Eliquis , OSA, poorly controlled T2DM, hx of CVA with residual hemiparesis, and epileptic syndromes with complex partial seizures who presents today for social determinants of health concerns, as well as getting her new CGM for her diabetes.    Review of Systems  Constitutional: Negative.   HENT: Negative.    Respiratory: Negative.    Cardiovascular: Negative.   Genitourinary: Negative.   Musculoskeletal: Negative.   Neurological: Negative.   Psychiatric/Behavioral:  Positive for depression.       Objective:    BP (!) 181/100 (BP Location: Right Arm, Patient Position: Sitting, Cuff Size: Small)   Pulse 76   Temp (!) 97.4 F (36.3 C) (Oral)   Ht 5' 9 (1.753 m)   LMP 09/13/2019 (Exact Date)   SpO2 95%   BMI 37.01 kg/m   Physical Exam Constitutional:      Appearance: Normal appearance.  HENT:     Head:     Comments: Chronic left facial droop Eyes:     Extraocular Movements: Extraocular movements intact.     Pupils: Pupils are equal, round, and reactive to light.  Cardiovascular:     Rate and Rhythm: Normal rate and regular rhythm.     Pulses: Normal pulses.     Heart sounds: Normal heart sounds.  Pulmonary:     Effort: Pulmonary effort is normal.     Breath sounds: Normal breath sounds.  Abdominal:     General: Abdomen is flat. Bowel sounds are normal.     Palpations: Abdomen is soft.  Musculoskeletal:        General: Normal range of motion.  Skin:    General: Skin is warm and dry.  Neurological:     Mental Status: She is alert and oriented to person, place, and time.     Motor: Weakness present.     Gait: Gait abnormal.     Comments: Chronic left hemiparesis affecting left  upper and lower extremities   Psychiatric:        Mood and Affect: Mood normal.        Behavior: Behavior normal.        Thought Content: Thought content normal.        Judgment: Judgment normal.       Assessment & Plan:   Patient seen with Dr. Ronnald Sergeant.  Social Determinants of Health  Patient has been undergoing an application process for the [facility] to [facility], which is a program that helps patients with mental or cognitive disabilities to live in their community. Patient has an extensive medical history and requires a home health nurse to see her daily to help with her ADLs and IADLs. She has some difficulty in memory that she is aware of, but states she has a large combination of people and resources to help her get to a better living situation. She is currently living with her daughter and feels safe there, however would like to be approved for this program so she can live independently and have assistance with paying rent. She states she just started having home health PT help with her residual weakness from her prior stroke. She is in a motorized wheelchair, and even got  herself to today's appointment by herself in her wheelchair when she was unable to be driven to the clinic. The patient states she has a friend, [name], who will be living with her if she were to be approved for this program who will help her out at home. She additionally has another friend, [name], who will fill in with any care gaps as well.  Our clinic was asked to perform a MOCA and MMSE assessment on the patient to gauge her level of cognition. However, the results were discordant, with her MOCA test scoring 14/30 and her MMSE scoring 27/30. Messaged her neurologist, Dr. Gregg, for full neuropsychiatric assessment for a proper diagnosis. Our clinic was able to set the patient up with new DME incontinence supplies, such as XL feminine pads, XL briefs, and bed pads (tucks), and a hemiwalker. Given the  complexity of the patient's situation and need for further evaluation, we have asked her to bring all her medications, her support team [name] and [name], as well as other paperwork she would need for this program to best help her. Patient voiced understanding and will return to clinic on 9/11 for follow up.    Type 2 Diabetes Mellitus Patient worked with [name] today to get her CGM placed and educated on how to use. She states her blood sugars will go as low as the 80-120s and high between 200-300. Her A1c 3 weeks ago was 9.0%. She has been on Lantus  48u, and states she has been taking it daily. She is keeping a journal of her sugars and will bring it back in 2 weeks when she follows up with [name]. Last microalb/creatinine ratio was undetectable on 8/26. Will consider getting a repeat urine next week during her visit with me given the above situation.  -repeat microalbumin/creatinine ratio -f/u [name] in 2 weeks for CGM f/u   Caitlyn Gauer, DO Internal Medicine Resident, PGY-1 [name] 5:41 PM 08/15/2024

## 2024-08-15 NOTE — Telephone Encounter (Signed)
 RTC to [facility].  Patient is here for an appointment in the Clinics today.  Asked Rocky to let us  know when she will plan to go out to the the PT evaluation. Copied from CRM #[id number]. Topic: Clinical - Home Health Verbal Orders >> Aug 15, 2024 12:33 PM Carrielelia G wrote: Caller/Agency: Erin with [facility]  Callback Number: [phone]   Any new concerns about the patient? Yes  There willl be a delay in the PT evaluation due to the patient not being available today

## 2024-08-15 NOTE — Patient Instructions (Signed)
 Thank you for your visit today! I am in awe of your determination! Wow!  You placed a Freestyle libre 3 plus Continuous glucose monitor on your left arm today and started it with your phone.   Keep your phone near you - even when sleeping,  Dismiss alarms and try to fix why they were alarming- high or low.   Try to keep your blood sugar in the green target zone as much as possible.   Smaller portions of starchy and sugary foods if often helpful. (Watch out for cold cereal and bananas...)  Please make a follow up in 2-3 weeks and bring a sensor with oiuy if you want help placing a new one.   You are connected to us  remotely so you can call in to discuss your blood sugars in a phone visit.   [place] [phone] (new number)

## 2024-08-16 ENCOUNTER — Telehealth: Payer: Self-pay | Admitting: Dietician

## 2024-08-16 ENCOUNTER — Other Ambulatory Visit: Payer: Self-pay | Admitting: Student

## 2024-08-16 DIAGNOSIS — M1712 Unilateral primary osteoarthritis, left knee: Secondary | ICD-10-CM

## 2024-08-16 NOTE — Progress Notes (Signed)
 Internal Medicine Clinic Attending  I was physically present during the key portions of the resident provided service and participated in the medical decision making of patient's management care. I reviewed pertinent patient test results.  The assessment, diagnosis, and plan were formulated together and I agree with the documentation in the resident's note.  Dickie La, MD

## 2024-08-16 NOTE — Addendum Note (Signed)
 Addended by: KARNA FELLOWS on: 08/16/2024 11:27 AM   Modules accepted: Level of Service

## 2024-08-16 NOTE — Telephone Encounter (Signed)
 Remote monitoring of glucose: Called  patient to discuss elevated blood glucose in the 200s this am and 273/302 between 9-10 am today. : she states that she has not eaten today. She wanted to wait until she comes back to discuss her blood sugars. She is seeing Dr. Isobel on 08/23/24.  Consider adding Long acting GLP-1.

## 2024-08-20 NOTE — Progress Notes (Incomplete)
 Patient ID: [name], female   DOB: 1966-05-23, 58 y.o.   MRN: [id number]    Advanced Heart Failure Clinic Note   EP: Dr Fernande PCP: Marylu Gee, DO HF Cardiologist: Dr Rolan   HPI: [name] is a 58 y.o. with history of morbid obesity, HTN, HLD, CAD, DM2, OSA on CPAP, CVA and chronic systolic CHF in the setting of poorly-controlled HTN. She was admitted in 05/2011 with chest pain and new LBBB and ended up getting a left heart cath, which showed no angiographic CAD. Renal artery dopplers were normal in 04/2012.  She has a Medtronic CRT-D device.   Admitted 5/14 with acute on chronic combined systolic and diastolic CHF. EF from December 2013 was down to 40 to 45%.   Admitted 1/15 with slurred speech, L arm weakness and facial droop. Initial head CT showed no acute abnormality. Repeat head CT showed subtle gray white matter differentiation in R frontal lobe and R operculum perhaps representing ischemic R sided MCA. STAT repeat CT head later on 1/29 showed further evolution of R MCA territory infarct involving R insular cortex and operculum. No hemorrhage. Neurology recommended CTA head/neck, which showed occlusion of MCA branch to the area of R MCA infarct and a normal neck CTA. TEE was done and did not reveal embolic source. Was discharged to inpatient rehab.  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  She was started on Keppra .  She went to the ER in 2/16 with syncope that was probably due to seizure, no arrhythmias with device interrogation.  Echo in 11/16 showed EF back down to 25-30%.  Echo in 2/18 showed EF back up to 45%.   s/p device generator change 4/22.    Echo 8/22, EF back down to 25% with mild LV dilation, mildly decreased RV systolic function, dilated IVC.   Follow up 8/22, NYHA III and volume overloaded. Farxiga  added and lasix  increased x 2 days. Unfortunately lost to follow up.  Admitted 10/23 with emphysematous cystitis 2/2 bilateral  hydroureteronephrosis and AKI. Treated with IV abx and discharged with foley catheter with plans for Urology follow up.  Echo 10/23 EF 25-30%, moderate LVH, grade I DD, RV mildly reduced  Admitted 3/24 with R foot osteomyelitis. Re-admitted 6/24 with osteomyelitis. 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  Today she returns for HF follow up. We have not seen her since 07/2021. She has LE swelling. Physically limited by left hemiparesis 2/2 CVA. She has a HH aide and her son helps with ADLs. She uses a motorized wheelchair when out of the house. She has mild dyspnea with getting in and out of her chair.  Denies palpitations, abnormal bleeding, CP, dizziness, or PND/Orthopnea. Appetite ok. No fever or chills. Weight at home 235 pounds. Has CPAP, due for another sleep study. Only taking Lasix  Monday and Fridays since her toe amputation.  ECG (personally reviewed): a-sensed, v-paced; NSR QRS 126 msec  MDT device interrogation (personally reviewed): OptiVol up and thoracic impedance down, rare short AF runs, no VT, 0.4 hr/day, 98.2 VP.    Labs (5/22): K 3.6, creatinine 0.82 Labs (7/22): LDL 112 Labs (6/24): K 4.4, creatinine 0.79, hgb 10.9, A1C 9.1  Echo (12/13): EF 40-45% Echo (8/14): 20% RV normal Echo (08/23/2013): EF 40-45%, diff HK Echo (09/05/2013): EF 25-30%, diff HK  Echo (12/14): EF 30%, diff HK, RV normal Echo (2/15): EF 35-40%, LA mod dilated  Echo (4/16): EF 40-45%, moderate LVH, normal RV  size with mildly decreased systolic function.   Echo (11/16): EF 25-30%, moderate LVH Echo (2/18): EF 45%, moderate LVH, normal RV size and systolic function.  Echo (8/22): EF 25%, mild LV dilation, mildly decreased RV systolic function, dilated IVC.  Echo (10/23): EF 25-30%, moderate LVH, grade I DD, RV mildly reduced   ROS: All systems negative except as listed in HPI, PMH and Problem List.  Past Medical History:  Diagnosis Date   Acute cystitis 06/19/2021   AKI (acute kidney  injury) (HCC) 09/13/2022   AKI (acute kidney injury) (HCC) 09/13/2022   Allergic rhinitis    Arthritis    hips, back, legs, arms (07/04/2014)   Asthma    hx   Automatic implantable cardioverter-defibrillator in situ    Calcifying tendinitis of shoulder    Chronic combined systolic and diastolic CHF (congestive heart failure) (HCC)    EF 40-45% by echo 12/06/2012   Chronic diastolic heart failure (HCC)     Primarily diastolic CHF: Likely due to uncontrolled HTN. Last echo (8/12) with EF 45-50%, mild to moderate LVH with some asymmetric septal hypertrophy, RV normal size and systolic function. EF 50-55% by LV-gram in 6/12.    Chronic lower back pain    secondary to DJD, obsetiy, hip problems. Followed by Dr. Darlyn Gunther (pain management)   Chronic systolic heart failure (HCC) 05/17/2022   Chronic use of opiate for therapeutic purpose 12/14/2016   Contact with and (suspected) exposure to covid-19 02/07/2020   Coronary artery disease    questionable. LHC 05/2011 showing normal coronaries // Followed [facility], Dr. Rolan   Cough 11/02/2022   Degeneration of lumbar or lumbosacral intervertebral disc    Diabetic foot infection (HCC) 03/05/2023   DJD (degenerative joint disease) of hip    right sided   Fatigue 04/27/2022   Frequent UTI    GERD (gastroesophageal reflux disease)    HLD (hyperlipidemia)    Hypertension    Poorly controlled. Has had HTN since age 18. Angioedema with ACEI.  24 Hr urine and renal arterial dopplers ordered . . . 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   Presence of permanent cardiac pacemaker    Rash 07/14/2016   Seizures (HCC)    last 3 months   Shortness of breath    none now   Stroke [facility]) 12/2013   my left side is paralyzed (07/04/2014)   Tachycardia 07/04/2014   Thoracic or lumbosacral neuritis or radiculitis, unspecified    Tinea cruris 09/13/2022   Type II diabetes mellitus (HCC) DX: 2002   Urinary frequency 11/05/2014   Vaginal discharge 02/05/2021    Current Outpatient Medications  Medication Sig Dispense Refill   Accu-Chek Softclix Lancets lancets Use to check blood sugar as directed 102 each 12   albuterol  (VENTOLIN  HFA) 108 (90 Base) MCG/ACT inhaler Inhale 2 puffs into the lungs every 4 (four) hours as  needed for wheezing or shortness of breath. 36 g 0   apixaban  (ELIQUIS ) 5 MG TABS tablet Take 1 tablet (5 mg total) by mouth 2 (two) times daily. 120 tablet 5   ascorbic acid  (VITAMIN C ) 500 MG tablet Take 2 tablets (1,000 mg total) by mouth daily. 100 tablet 0   Blood Glucose Monitoring Suppl (ACCU-CHEK GUIDE) w/Device KIT Use As Directed 1 kit 0   Continuous Glucose Sensor (FREESTYLE LIBRE 3 PLUS SENSOR) MISC Change sensor every 15 days. 6 each 3   cyanocobalamin  1000 MCG tablet Take 1 tablet by mouth daily.     cyclobenzaprine  (FLEXERIL ) 10 MG tablet Take 1 tablet (10 mg total) by mouth 2 (two) times daily as needed for muscle spasms. 60 tablet 1   diclofenac  Sodium (VOLTAREN ) 1 % GEL Apply 4 g topically 4 (four) times daily as needed (Pain). Left knee 100 g 1   escitalopram  (LEXAPRO ) 10 MG tablet Take 1 tablet (10 mg total) by mouth daily. 90 tablet 3   famotidine  (PEPCID ) 20 MG tablet Take 1 tablet (20 mg total) by mouth 2 (two) times daily. 60 tablet 3   feeding supplement (ENSURE ENLIVE / ENSURE PLUS) LIQD Take 237 mLs by mouth 2 (two) times daily between meals.     gabapentin   (NEURONTIN ) 300 MG capsule Take 1 capsule (300 mg total) by mouth 3 (three) times daily. 180 capsule 5   glucose blood (ACCU-CHEK GUIDE TEST) test strip Use as instructed 100 each 12   GNP ULTICARE PEN NEEDLES 31G X 8 MM MISC Inject 1 each into the skin 4 (four) times daily. 200 each 0   insulin  glargine (LANTUS  SOLOSTAR) 100 UNIT/ML Solostar Pen Inject 48 Units into the skin daily. 15 mL 11   isosorbide -hydrALAZINE  (BIDIL ) 20-37.5 MG tablet Take 1 tablet by mouth 3 (three) times daily. 90 tablet 11   Lacosamide  (VIMPAT ) 100 MG TABS Take 2 tablets (200 mg total) by mouth 2 (two) times daily. 120 tablet 11   lacosamide  (VIMPAT ) 200 MG TABS tablet Take 1 tablet (200 mg total) by mouth 2 (two) times daily. 180 tablet 3   levETIRAcetam  (KEPPRA ) 500 MG tablet Take 1 tablet (500 mg total) by mouth 2 (two) times daily. 180 tablet 3   LINZESS  290 MCG CAPS capsule TAKE ONE CAPSULE BY MOUTH EVERY DAY AS NEEDED FOR CONSTIPATION 30 capsule 3   melatonin 3 MG TABS tablet Take 3 mg by mouth at bedtime.     metoprolol  succinate (TOPROL  XL) 100 MG 24 hr tablet Take 1 tablet (100 mg total) by mouth daily. Take with or immediately following a meal. 30 tablet 0   nutrition supplement, JUVEN, (JUVEN) PACK Take 1 packet by mouth 2 (two) times daily between meals.     Nystatin  (GERHARDT'S BUTT CREAM) CREA Apply 1 Application topically 2 (two) times daily.     polyethylene glycol (MIRALAX  / GLYCOLAX ) 17 g packet Take 17 g by mouth daily as needed.     potassium chloride  SA (KLOR-CON  M20) 20 MEQ tablet Take 1 tablet (20 mEq total) by mouth daily. 30 tablet 0   PREMARIN  vaginal cream Place 0.5 (1gram) Applicatorfuls vaginally 3 (three) times a week. ** This is 70 DAYS supply ** 30 g 2   psyllium (HYDROCIL/METAMUCIL) 95 % PACK Take 1 packet by mouth daily.     rosuvastatin  (CRESTOR ) 40 MG tablet Take 1 tablet (40 mg total) by mouth daily. 90 tablet 3   senna (SENOKOT) 8.6 MG TABS tablet Take  1 tablet (8.6 mg total) by mouth  2 (two) times daily as needed for moderate constipation.     spironolactone  (ALDACTONE ) 25 MG tablet Take 1 tablet (25 mg total) by mouth daily. 90 tablet 3   talc  powder Apply topically as needed. 240 g 0   Vitamin D , Ergocalciferol , (DRISDOL ) 1.25 MG (50000 UNIT) CAPS capsule TAKE ONE CAPSULE BY MOUTH EVERY 7 DAYS 5 capsule 5   Zinc  Sulfate 220 (50 Zn) MG TABS Take 1 tablet (220 mg total) by mouth daily. 100 tablet 0   No current facility-administered medications for this visit.   LMP 09/13/2019 (Exact Date)   Wt Readings from Last 3 Encounters:  07/31/24 113.7 kg (250 lb 9.6 oz)  04/12/24 110.7 kg (244 lb)  11/15/23 111.1 kg (245 lb)    PHYSICAL EXAM: General:  NAD. No resp difficulty, arrived in motorized wheelchair HEENT: Normal Neck: Supple. JVP 10. Carotids 2+ bilat; no bruits. No lymphadenopathy or thryomegaly appreciated. Cor: PMI nondisplaced. Regular rate & rhythm. No rubs, gallops or murmurs. Lungs: Clear Abdomen: Soft, obese, nontender, nondistended. No hepatosplenomegaly. No bruits or masses. Good bowel sounds. Extremities: No cyanosis, clubbing, rash, 1-2 + BLE edema; R ortho shoe on Neuro: Alert & oriented x 3, cranial nerves grossly intact. + Left hemiparesis. Affect pleasant.  ASSESSMENT/PLAN: 1. Chronic systolic HF: Nonischemic cardiomyopathy, normal coronaries on 6/12 angiography.  Cardiomyopathy may be due to long-standing and poorly-controlled HTN.  Echo 11/16 EF 25-30%, echo 2/18 with EF improved to 45%.  S/P CRT-D Medtronic.  Echo 8/22 showed EF back down to 25% with mildly decreased RV systolic function.  Unfortunately lost to follow up. Echo 10/23 showed EF 25-30%, moderate LVH, grade I DD, RV mildly reduced. Today, she is volume overloaded by exam and OptiVol. NYHA class III symptoms, confounded by hemiparesis and recent toe amputation.  - Increase Lasix  to 40 mg daily, increase KCL to 20 daily.  BMET/BNP today, repeat BMET in 10 days.  - Continue Toprol  XL 50  mg daily.  - Continue spironolactone  100 mg daily.  - Continue Imdur  90 mg daily + hydralazine  25 mg tid. - Off Farxiga  with yeast infections. Consider re-trial when A1C improved.  - Plan to add valsartan next visit (no ACEi or Entresto with angioedema) - Repeat echo after GDMT titrated and diuresed. 2. HTN: BP elevated. Diurese as above. - Can stop amlodipine  in the future to allow uptitration of GDMT.  3. Morbid obesity:  There is no height or weight on file to calculate BMI. 4. OSA on CPAP:  Continue CPAP. 5. Atrial fibrillation: PAF. Patient had a CVA in the past.  CHADSVASC = 5.  - Continue Xarelto .  CBC today. 6. CVA: Prior CVA with left leg weakness.  7. 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 Harden 8. DM2: Recent A1C 9.1 - She is on insulin . - Consider re-trail of SGLT2i if/when A1C improves.  Follow up in 3-4 weeks with APP for GDMT titration and 3 months with Dr. Rolan + echo  Harlene HERO [facility] FNP-BC 08/20/2024

## 2024-08-21 ENCOUNTER — Telehealth (HOSPITAL_COMMUNITY): Payer: Self-pay

## 2024-08-21 NOTE — Telephone Encounter (Signed)
 Called to confirm/remind patient of their appointment at the Advanced Heart Failure Clinic on 08/22/24.   Appointment:   [] Confirmed  [] Left mess   [x] No answer/No voice mail  [] VM Full/unable to leave message  [] Phone not in service

## 2024-08-22 ENCOUNTER — Encounter (HOSPITAL_COMMUNITY)

## 2024-08-23 ENCOUNTER — Other Ambulatory Visit: Payer: Self-pay | Admitting: Student

## 2024-08-23 ENCOUNTER — Encounter

## 2024-08-23 NOTE — Progress Notes (Deleted)
   Established Patient Office Visit  Subjective   Patient ID: [name], female    DOB: Sep 17, 1966  Age: 58 y.o. MRN: [id number]  No chief complaint on file.  [name] is a 58 year old female with a pertinent past medical history of hypertension, HFrEF (EF 25-30% in 2023), atrial fibrillation on Eliquis , OSA, poorly controlled type 2 diabetes, history of CVA with residual hemiparesis, and epileptic syndromes with complex partial seizures who presents today for social determinants of health concerns.  Today, she is present with ****. please see problem-based assessment and plan for details below.   ROS    Objective:    LMP 09/13/2019 (Exact Date)    Physical Exam     Assessment & Plan:   Patient seen with {IMTSattending2025/2026:32924}.  Social Determinants of Health Please see previous note for extensive history.  Today patient is here with ***, who is to serve as her live-in care attendant, and her friend ***, who will be providing additional care on a daily basis.  Before today's visit, I was able to speak with Ginny Cha from [facility], about patient's current situation.  Ginny states that our office needs to provide a documented assessment, both physical and cognitive, to determine if the patient is able to reasonably and safely live by herself within the community.  She is currently in the process of applying for the Transition to [facility], which helps patients with mental or cognitive disabilities to live on their own in the community.  I did determine that patient could technically qualify with a live-in aide so long as she is cognitively capable.  However, patient is still pending a neuropsychiatric evaluation, as this exam is outside of her neurologist office's ability.  Will send neuropsychiatric referral today.  -can patient translate self into chair or bed? Can she get herself onto toilet?  Caitlyn Clopper, DO

## 2024-08-23 NOTE — Patient Instructions (Incomplete)
 Thank you, [name] for allowing us  to provide your care today. Today we discussed ***.    Referrals: -  New medications: -  I have ordered the following labs for you:  Lab Orders  No laboratory test(s) ordered today     I will call if any are abnormal. All of your labs can be accessed through My Chart.   My Chart Access: [email or web address]  Please follow-up in:    We look forward to seeing you next time. Please call our clinic at [phone] if you have any questions or concerns. The best time to call is Monday-Friday from 9am-4pm, but there is someone available 24/7. If after hours or the weekend, call the main hospital number and ask for the Internal Medicine Resident On-Call. If you need medication refills, please notify your pharmacy one week in advance and they will send us  a request.   Thank you for letting us  take part in your care. Wishing you the best!  Caitlyn Daudelin, DO 08/23/2024, 10:19 AM [name]

## 2024-08-27 NOTE — Telephone Encounter (Signed)
 Spoke with the patient who has resch her appt to address additional questions.  Name: [name], [name] MRN: [id number]  Date: 08/28/2024 Status: Sch  Time: 9:15 AM Length: 60  Visit Type: OFFICE VISIT [8002] Copay: $0.00  Provider: Kandis Perkins, DO      Copied from CRM [id number]. Topic: General - Other >> Aug 23, 2024  3:06 PM [name] wrote: Reason for CRM: [phone] patient has appt [facility] [place] [place], [place] on next Tuesday, she states she has a few questions ask in regard to the appt.

## 2024-08-28 ENCOUNTER — Ambulatory Visit (INDEPENDENT_AMBULATORY_CARE_PROVIDER_SITE_OTHER): Admitting: Dietician

## 2024-08-28 ENCOUNTER — Ambulatory Visit: Admitting: Student

## 2024-08-28 ENCOUNTER — Other Ambulatory Visit: Payer: Self-pay

## 2024-08-28 ENCOUNTER — Other Ambulatory Visit: Payer: Self-pay | Admitting: Student

## 2024-08-28 ENCOUNTER — Telehealth: Payer: Self-pay

## 2024-08-28 ENCOUNTER — Other Ambulatory Visit (HOSPITAL_COMMUNITY): Payer: Self-pay

## 2024-08-28 VITALS — BP 143/92 | HR 90 | Temp 98.1°F | Ht 69.0 in | Wt 243.6 lb

## 2024-08-28 DIAGNOSIS — E1140 Type 2 diabetes mellitus with diabetic neuropathy, unspecified: Secondary | ICD-10-CM

## 2024-08-28 DIAGNOSIS — E1165 Type 2 diabetes mellitus with hyperglycemia: Secondary | ICD-10-CM

## 2024-08-28 DIAGNOSIS — Z794 Long term (current) use of insulin: Secondary | ICD-10-CM

## 2024-08-28 DIAGNOSIS — Z23 Encounter for immunization: Secondary | ICD-10-CM | POA: Diagnosis not present

## 2024-08-28 DIAGNOSIS — I69354 Hemiplegia and hemiparesis following cerebral infarction affecting left non-dominant side: Secondary | ICD-10-CM | POA: Diagnosis not present

## 2024-08-28 MED ORDER — SEMAGLUTIDE(0.25 OR 0.5MG/DOS) 2 MG/3ML ~~LOC~~ SOPN: 0.2500 mg | PEN_INJECTOR | SUBCUTANEOUS | 0 refills | Status: DC

## 2024-08-28 MED ORDER — LANTUS SOLOSTAR 100 UNIT/ML ~~LOC~~ SOPN: 56.0000 [IU] | PEN_INJECTOR | Freq: Every day | SUBCUTANEOUS | 11 refills | Status: DC

## 2024-08-28 MED ORDER — METOPROLOL SUCCINATE ER 100 MG PO TB24: 100.0000 mg | ORAL_TABLET | Freq: Every day | ORAL | 2 refills | Status: DC

## 2024-08-28 NOTE — Telephone Encounter (Signed)
 Prior Authorization for patient (Ozempic  (0.25 or 0.5 MG/DOSE) 2MG /3ML pen-injectors) came through on cover my meds was submitted with last office notes and labs awaiting approval or denial.  [id number]

## 2024-08-28 NOTE — Progress Notes (Unsigned)
 Established Patient Office Visit  Subjective   Patient ID: [name], female    DOB: 11-Jul-1966  Age: 58 y.o. MRN: [id number]  No chief complaint on file.   [name] is a 59 y.o. who presents to the clinic for ***. Please see problem based assessment and plan for additional details.  doNNA OUT OF TOWN. Tanisha: nurse here with her and plan to be the live nurse will live here  [place] and [place]: help outside of nurse.   Neurologist: January is the soonest   ADL -toilet -Bath x -Dressing x -feed  -transfer   Phone: Y Finances: Y Shopping: Y Cleaning: Y Cooking: N Medicaitions: Y  T2DM Patient presents with a history of T2DM with a prior A1c of *** in ***.  Patient's A1c today is ***.  They are on a regimen of Lantus  48 daily.  Patient denies hypoglycemia.  Per review of CGM, average blood glucose is 264.   Plan: -Continue regimen of: go back on Trulicity ... try ozempic ? 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   On tube feeding diet 03/29/2024   Decreased level of consciousness 03/29/2024   Seizure disorder as sequela of cerebrovascular accident (HCC) 03/29/2024   Pressure injury of buttock, stage 2 (HCC) 03/26/2024   Pressure injury of coccygeal region, stage 2 (HCC) 03/26/2024   Traumatic brain injury (HCC) 03/23/2024   Status epilepticus (HCC), resolved 03/09/2024   Seizures, post-traumatic (HCC) 03/07/2024   Subdural hematoma (HCC) 03/02/2024   Osteomyelitis of fifth toe of right foot (HCC) 05/16/2023   Type 2 diabetes mellitus with hyperglycemia, with long-term current use of insulin  (HCC) 03/07/2023   PTSD (post-traumatic stress disorder) 01/20/2023   Anxiety  state 01/20/2023   Insomnia due to other mental disorder 01/20/2023   Housing instability 11/18/2022   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   Atrial fibrillation (HCC) 09/13/2019   Vitamin B12 deficiency 09/15/2017   Lumbar radiculopathy 08/03/2017   Constipation 09/09/2015   Localization-related symptomatic epilepsy and epileptic syndromes with complex partial seizures, not intractable, without status epilepticus (HCC) 06/13/2015   Hemiparesis affecting left side as late effect of cerebrovascular accident (HCC) 06/13/2015   Morbid obesity (HCC) 06/13/2015   Intertrigo 11/05/2014   Vitamin D  deficiency 10/01/2014   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/23/2013   Mixed stress and urge incontinence 10/11/2013   Health care maintenance 09/13/2013   Chronic combined systolic and diastolic heart failure (HCC) 08/01/2013   Nonischemic cardiomyopathy (HCC) 08/01/2013   LBBB (left bundle branch block) 07/19/2013   Depression 04/09/2013   Primary osteoarthritis of left knee 04/14/2012   Lumbar spondylosis 04/14/2012   Poorly controlled type 2 diabetes mellitus with neuropathy (HCC) 04/14/2012   Hyperlipidemia 11/23/2011   OSA (obstructive sleep apnea) 08/27/2011   Benign essential HTN 08/27/2010      ROS    Objective:     LMP 09/13/2019 (Exact Date)  BP Readings from Last 3 Encounters:  08/15/24 (!) 181/100  07/31/24 124/67  07/24/24 (!) 143/87   Wt Readings from Last 3 Encounters:  07/31/24 250 lb 9.6 oz (113.7 kg)  04/12/24 244 lb (110.7 kg)  11/15/23 245 lb (111.1 kg)      Physical Exam  No results found for any visits on 08/28/24.  Last metabolic panel Lab Results  Component Value Date   GLUCOSE 388 (H) 07/24/2024   NA 136 07/24/2024   K 4.0 07/24/2024   CL 97 07/24/2024   CO2 21 07/24/2024   BUN 10 07/24/2024   CREATININE 0.95 07/24/2024   EGFR 70 07/24/2024   CALCIUM  9.2 07/24/2024   PHOS 3.0 03/15/2024   PROT 7.7 04/05/2024   ALBUMIN  3.0 (L) 04/05/2024   BILITOT 0.7 04/05/2024   ALKPHOS 81 04/05/2024   AST 20 04/05/2024   ALT 22 04/05/2024   ANIONGAP 14 04/14/2024   Last lipids Lab Results  Component Value Date   CHOL 191 07/24/2024   HDL 45 07/24/2024   LDLCALC 103 (H) 07/24/2024   LDLDIRECT 110.9 08/27/2011   TRIG 254 (H) 07/24/2024   CHOLHDL 4.2 07/24/2024   Last hemoglobin A1c Lab Results  Component Value Date   HGBA1C 9.0 (H) 07/24/2024      The ASCVD Risk score (Arnett DK, et al., 2019) failed to calculate for the following reasons:   Risk score cannot be calculated because patient has a medical history suggesting prior/existing ASCVD    Assessment & Plan:   Problem List Items Addressed This Visit   None   No follow-ups on file.    Damien Lease, DO

## 2024-08-28 NOTE — Patient Instructions (Signed)
 Thank you, Ms.[name] for allowing us  to provide your care today. Today we discussed diabetes and a live in aide. We are completing your paperwork for the live in aide.   For the diabetes, please increase your daily insulin  to 56 units a day. Call the clinic if you have any LOWS (less than 70). We have also started you on Ozempic , inject 0.25 mg ONCE per week. If you have any nausea or vomiting please let us  know.    I have ordered the following labs for you:   Lab Orders         Microalbumin / Creatinine Urine Ratio       I have ordered the following medication/changed the following medications:   Stop the following medications: Medications Discontinued During This Encounter  Medication Reason   potassium chloride  SA (KLOR-CON  M20) 20 MEQ tablet    insulin  glargine (LANTUS  SOLOSTAR) 100 UNIT/ML Solostar Pen Reorder     Start the following medications: Meds ordered this encounter  Medications   Semaglutide ,0.25 or 0.5MG /DOS, 2 MG/3ML SOPN    Sig: Inject 0.25 mg into the skin once a week.    Dispense:  3 mL    Refill:  0   insulin  glargine (LANTUS  SOLOSTAR) 100 UNIT/ML Solostar Pen    Sig: Inject 56 Units into the skin daily.    Dispense:  15 mL    Refill:  11     Follow up: Three months or sooner   Should you have any questions or concerns please call the internal medicine clinic at [phone].     Please note that our late policy has changed.  If you are more than 15 minutes late to your appointment, you may be asked to reschedule your appointment.  Dr. Kandis, D.O. [facility]

## 2024-08-28 NOTE — Telephone Encounter (Unsigned)
 Copied from CRM [id number]. Topic: Clinical - Medication Refill >> Aug 28, 2024  3:10 PM Latavia C wrote: Medication: metoprolol  succinate (TOPROL  XL) 100 MG 24 hr tablet LINZESS  290 MCG CAPS capsule   Has the patient contacted their pharmacy? No (Agent: If no, request that the patient contact the pharmacy for the refill. If patient does not wish to contact the pharmacy document the reason why and proceed with request.) (Agent: If yes, when and what did the pharmacy advise?)  This is the patient's preferred pharmacy:  [facility] - [place], [place] - [address] [place] [address] Phone: [phone] Fax: [phone]  Is this the correct pharmacy for this prescription? Yes If no, delete pharmacy and type the correct one.   Has the prescription been filled recently? No  Is the patient out of the medication? Yes   Has the patient been seen for an appointment in the last year OR does the patient have an upcoming appointment? Yes  Can we respond through MyChart? Yes  Agent: Please be advised that Rx refills may take up to 3 business days. We ask that you follow-up with your pharmacy.

## 2024-08-28 NOTE — Progress Notes (Signed)
 BP Readings from Last 3 Encounters:  08/28/24 (!) 143/92  08/15/24 (!) 181/100  07/31/24 124/67   Wt Readings from Last 10 Encounters:  08/28/24 243 lb 9.6 oz (110.5 kg)  07/31/24 250 lb 9.6 oz (113.7 kg)  04/12/24 244 lb (110.7 kg)  11/15/23 245 lb (111.1 kg)  09/28/23 257 lb 12.8 oz (116.9 kg)  08/26/23 235 lb (106.6 kg)  07/27/23 242 lb 6.4 oz (110 kg)  06/29/23 241 lb 9.6 oz (109.6 kg)  05/23/23 236 lb (107 kg)  05/16/23 226 lb (102.5 kg)   Diabetes Self-Management Education  Visit Type: Follow-up (1st after initial)  Appt. Start Time: 1025 Appt. End Time: 1100  08/28/2024  [name], identified by name and date of birth, is a 58 y.o. female with a diagnosis of Diabetes:  SABRA Type 2  ASSESSMENT Patient states her phone died and this is why her sensor only recorded 11-12 days.   Caregiver to apply new sensor when they get a phone and home as they did not bring one with them. Estimated body mass index is 35.97 kg/m as calculated from the following:   Height as of an earlier encounter on 08/28/24: 5' 9 (1.753 m).   Weight as of an earlier encounter on 08/28/24: 243 lb 9.6 oz (110.5 kg). Lab Results  Component Value Date   HGBA1C 9.0 (H) 07/24/2024   HGBA1C 11.3 (H) 03/02/2024   HGBA1C 9.4 (A) 08/31/2023   HGBA1C 9.1 (A) 06/02/2023   HGBA1C 10.4 (H) 03/06/2023      Diabetes Self-Management Education - 08/28/24 1100       Visit Information   Visit Type Follow-up   1st after initial     Health Coping   How would you rate your overall health? Good      Psychosocial Assessment   Other persons present Friend   caregiver whose mother uses CGM and GLP-1s     Pre-Education Assessment   Patient understands using medications safely. Needs Review   new medicine added today- GLP-1 RA     Complications   How often do you check your blood sugar? > 4 times/day    Fasting Blood glucose range (mg/dL) >799    Postprandial Blood glucose range (mg/dL) >799    Number of  hypoglycemic episodes per month 0    Have you had a dilated eye exam in the past 12 months? --   she is working on this     Dietary Intake   Dinner baked BBQ chicken    Beverage(s) only water       Activity / Exercise   Activity / Exercise Type ADL's      Patient Education   Previous Diabetes Education Yes   here   Medications Reviewed patients medication for diabetes, action, purpose, timing of dose and side effects.    Monitoring Taught/evaluated CGM (comment)      Patient Self-Evaluation of Goals - Patient rates self as meeting previously set goals (% of time)   Monitoring >75% (most of the time)      Post-Education Assessment   Patient understands incorporating nutritional management into lifestyle. Comprehends key points    Patient understands using medications safely. Comphrehends key points    Patient understands monitoring blood glucose, interpreting and using results Comprehends key points    Patient understands prevention, detection, and treatment of acute complications. Comprehends key points      Outcomes   Expected Outcomes Demonstrated interest in learning but significant barriers to change  Future DMSE 4-6 wks    Program Status Not Completed      Subsequent Visit   Since your last visit have you continued or begun to take your medications as prescribed? Yes    Since your last visit have you had your blood pressure checked? Yes    Is your most recent blood pressure lower, unchanged, or higher since your last visit? Lower    Since your last visit have you experienced any weight changes? Loss    Weight Loss (lbs) 7    Since your last visit, are you checking your blood glucose at least once a day? Yes   CGM sensor stopped yesterday so no data for one day         Individualized Plan for Diabetes Self-Management Training:   Learning Objective:  Patient will have a greater understanding of diabetes self-management. Patient education plan is to attend individual  and/or group sessions per assessed needs and concerns.   Plan:   Patient Instructions  Thank you for your visit today!   To help you better tolerate your GLP-1 Receptor Agonist:  Limit greasy or fatty foods Eat smaller meals  Encourage water  and no sugar added hydration  Ginger or peppermint may help nausea  Focus on bland foods- soft scrambled eggs or yogurt, whole wheat toast, [place] or tuna sandwich, baked chicken, brown rice, green beans or broccoli or greens Increase fiber by adding whole grains (whole wheat bread, brown rice, oatmeal), beans, fruits and vegetables.   Please follow up on same day you see the doctor.   Arland [phone] (new number)    Expected Outcomes:  Demonstrated interest in learning but significant barriers to change  Education material provided: Diabetes Resources  If problems or questions, patient to contact team via:  Phone  Future DSME appointment: 4-6 wks Arland Hole, RD 08/28/2024 11:34 AM.

## 2024-08-28 NOTE — Telephone Encounter (Signed)
 Per insurance test claim: NON-FORMULARY DRUG, CONTACT PRESCRIBER (PHARMACY HELP DESK [phone])  However medication can be filled for $35. This is most likely a courtesy fill while PA is completed.

## 2024-08-28 NOTE — Telephone Encounter (Signed)
 PA ready to submit, awaiting chart notes to close from todays appt  Latent key [id number]

## 2024-08-28 NOTE — Patient Instructions (Addendum)
 Thank you for your visit today!   To help you better tolerate your GLP-1 Receptor Agonist:  Limit greasy or fatty foods Eat smaller meals  Encourage water  and no sugar added hydration  Ginger or peppermint may help nausea  Focus on bland foods- soft scrambled eggs or yogurt, whole wheat toast, [place] or tuna sandwich, baked chicken, brown rice, green beans or broccoli or greens Increase fiber by adding whole grains (whole wheat bread, brown rice, oatmeal), beans, fruits and vegetables.   Please follow up on same day you see the doctor.   Arland [phone] (new number)

## 2024-08-29 ENCOUNTER — Other Ambulatory Visit (HOSPITAL_COMMUNITY): Payer: Self-pay

## 2024-08-29 ENCOUNTER — Telehealth: Payer: Self-pay

## 2024-08-29 LAB — MICROALBUMIN / CREATININE URINE RATIO
Creatinine, Urine: <4.2 mg/dL
Microalbumin, Urine: 79.9 ug/mL

## 2024-08-29 MED ORDER — LINACLOTIDE 290 MCG PO CAPS: 290.0000 ug | ORAL_CAPSULE | Freq: Every day | ORAL | 3 refills | Status: DC

## 2024-08-29 NOTE — Progress Notes (Signed)
 Internal Medicine Clinic Attending  Case discussed with the resident at the time of the visit.  We reviewed the resident's history and exam and pertinent patient test results.  I agree with the assessment, diagnosis, and plan of care documented in the resident's note.

## 2024-08-29 NOTE — Telephone Encounter (Signed)
 BREANAH FADDIS (Key: [id number]) PA Case ID #: [id number] Rx #: [id number] Need Help? Call us  at [phone] Outcome Approved today by [facility] NCPDP 2017 Your PA request has been approved. Additional information will be provided in the approval communication. (Message 1145) Effective Date: 08/29/2024 Authorization Expiration Date: 08/29/2025 Drug Linzess  capsules ePA cloud Psychologist, educational Electronic PA Form [phone] NCPDP) Original Claim Info 1

## 2024-08-29 NOTE — Telephone Encounter (Signed)
 Dr.Bender can you let me know when you finish your note so I can submit the pa. Thanks.

## 2024-08-29 NOTE — Telephone Encounter (Signed)
 Unable to submit new PA due to one already being in progress. Insurance will need to be contacted to reverse the PA or send a decision with the current one.

## 2024-08-29 NOTE — Assessment & Plan Note (Signed)
 Patient was last evaluated 2 weeks prior to discuss her capability of either living on her own or the necessity of a live-in aide.  With patient's MOCA test scoring 14/30, left hemiparesis after stroke, history of partial seizures, and need for assistance with ADLs and IADLs, I do not think that patient would be safe living home alone.  It is my recommendation that patient obtains a live-in aide. In regards to ADLs, patient needs assistance with bathing and dressing daily.  In regards to IADLs, patient needs assistance with cooking, cleaning, and transportation to go shopping.  Leslee Burnet is the patient's planned live-in aide.  She is present at the appointment.  Patient also reported that she will have additional help from to friend's name Lynwood and Arland.  Plan: - Paperwork completed and provided to our referral coordinator -Again, patient is not safe to live alone but I do believe that she will benefit greatly from a live-in aide to assist with her ADLs and IADLs on a daily basis

## 2024-08-29 NOTE — Assessment & Plan Note (Signed)
 Patient presents with a history of T2DM with a prior A1c of 9.0 in August 2025. They are on a regimen of Lantus  48 daily.  Patient denies hypoglycemia.  Per review of CGM, average blood glucose is 274 without hypoglycemia Plan: -Increase Lantus  to 56 units daily, start ozempic  0.25 mg weekly injection (patient was on Trulicity  in the past, if ozempic  is not covered by insurance I would recommend starting Trulicity )  -Ophthalmology referral already sent at prior visit  -Urine ACR today

## 2024-08-29 NOTE — Telephone Encounter (Signed)
 Prior Authorization for patient (Linzess  capsules) came through on cover my meds was submitted with last office notes awaiting approval or denial.  XZB:[id number]

## 2024-08-30 ENCOUNTER — Other Ambulatory Visit (HOSPITAL_COMMUNITY): Payer: Self-pay

## 2024-08-30 NOTE — Telephone Encounter (Signed)
 Per insurance:     Basaglar  and [place] preferred

## 2024-08-30 NOTE — Telephone Encounter (Signed)
 Per [facility] rep:   PA was submitted 08/29/24 and denied 08/29/24 for Insulin  glargine. A denial fax should be sent to my fax line today [phone]).

## 2024-08-31 ENCOUNTER — Ambulatory Visit: Payer: Self-pay | Admitting: Student

## 2024-08-31 ENCOUNTER — Telehealth: Payer: Self-pay | Admitting: *Deleted

## 2024-08-31 MED ORDER — BASAGLAR KWIKPEN 100 UNIT/ML ~~LOC~~ SOPN: 56.0000 [IU] | PEN_INJECTOR | Freq: Every day | SUBCUTANEOUS | 11 refills | Status: AC

## 2024-08-31 NOTE — Addendum Note (Signed)
 Addended by: KRISTY ROSARIO IP on: 08/31/2024 06:22 PM   Modules accepted: Orders

## 2024-08-31 NOTE — Telephone Encounter (Signed)
 Placed order for Basaglar  pen, 56 units just as Dr. Kandis had ordered. Please contact call pager over the weekends at [phone] if further needs arise/patient cannot access insulin .

## 2024-08-31 NOTE — Telephone Encounter (Addendum)
 [employer] prior authorization form has been completed and attached with last office notes and labs. Form has been placed in the box for [facility] to fax.  Fax:[phone] Phone:[phone]

## 2024-08-31 NOTE — Telephone Encounter (Addendum)
 [employer] prior authorization form has been completed and attached with last office notes and labs. Form has been placed in the box for Hme to fax.

## 2024-08-31 NOTE — Telephone Encounter (Signed)
 Copied from CRM [id number]. Topic: Clinical - Medication Prior Auth >> Aug 31, 2024 10:54 AM Farrel B wrote: Reason for CRM: Semaglutide ,0.25 or 0.5MG /DOS, 2 MG/3ML SOPN patient stated the pharmacy advised her that this medication is needing prior auth. Please call and advise. She also that her phone is not working at this time because she dropped it in water , but the phone number provided is her friend Ms. Perez's number its [phone]

## 2024-09-03 NOTE — Telephone Encounter (Signed)
Prior authorization has been faxed.

## 2024-09-04 ENCOUNTER — Telehealth: Payer: Self-pay

## 2024-09-04 MED ORDER — TRULICITY 0.75 MG/0.5ML ~~LOC~~ SOAJ: 0.7500 mg | SUBCUTANEOUS | 0 refills | Status: AC

## 2024-09-04 MED ORDER — TRULICITY 1.5 MG/0.5ML ~~LOC~~ SOAJ: 1.5000 mg | SUBCUTANEOUS | 1 refills | Status: DC

## 2024-09-04 NOTE — Telephone Encounter (Signed)
 JOSSLYNN MENTZER (Key: [id number]) PA Case ID #: [id number] Rx #: [id number] Need Help? Call us  at [phone] Outcome Approved today by [facility] NCPDP 2017 Your PA request has been approved. Additional information will be provided in the approval communication. (Message 1145) Effective Date: 09/04/2024 Authorization Expiration Date: 09/04/2025 Drug Trulicity  0.75MG /0.5ML auto-injectors ePA cloud Psychologist, educational Electronic PA Form [phone] NCPDP) Original Claim Info [id number] PRIOR AUTH REQ-MD CALL [phone] THERAPY IS REQUIREDDRUG REQUIRES PRIOR AUTHORIZATION(PHARMACY HELP DESK [phone])  Patient is aware of approval.

## 2024-09-04 NOTE — Telephone Encounter (Addendum)
 Pa denied, no additional information on why this request has been denied. Please send in alternative rx.  Patient is aware of the denial.

## 2024-09-04 NOTE — Telephone Encounter (Signed)
 Pa denied patient is aware.

## 2024-09-04 NOTE — Addendum Note (Signed)
 Addended by: Erricka Falkner on: 09/04/2024 11:41 AM   Modules accepted: Orders

## 2024-09-04 NOTE — Telephone Encounter (Signed)
 Prior authorization for Ozempic  denied.  Trulicity  which the patient has previously been on seems to be covered and I will send in 0.75 mg weekly for 4 weeks and then increase to 1.5 mg weekly after this.

## 2024-09-04 NOTE — Telephone Encounter (Signed)
 Prior Authorization for patient (Trulicity  0.75MG /0.5ML auto-injectors) came through on cover my meds was submitted with last office notes and labs awaiting approval or denial.  XZB:[id number]

## 2024-09-05 ENCOUNTER — Other Ambulatory Visit (HOSPITAL_COMMUNITY): Payer: Self-pay

## 2024-09-05 ENCOUNTER — Telehealth: Payer: Self-pay

## 2024-09-05 NOTE — Telephone Encounter (Signed)
 KHAILEE MICK (Key: [id number]) PA Case ID #: [id number] Rx #: [id number] Need Help? Call us  at [phone] Archived today by you Outcome Approved today by [facility] NCPDP 2017 Your PA request has been closed. No PA required at this time. Please have the pharmacy process the request. Drug Trulicity  1.5MG /0.5ML auto-injectors ePA cloud Optician, dispensing PA Form [phone] NCPDP) Original Claim Info 76 GLP1 FILL LIMIT EXCEEDED PH [phone] EXCLUSIONPRIOR AUTHORIZATION EXPIR(PHARMACY HELP DESK [phone]

## 2024-09-05 NOTE — Telephone Encounter (Signed)
 Prior Authorization for patient (Trulicity  1.5MG /0.5ML auto-injectors) came through on cover my meds was submitted with last office notes and labs awaiting approval or denial.  KEY:[id number]

## 2024-09-06 ENCOUNTER — Telehealth: Payer: Self-pay | Admitting: *Deleted

## 2024-09-06 NOTE — Telephone Encounter (Signed)
 Copied from CRM [id number]. Topic: General - Other >> Sep 06, 2024  8:04 AM [name] wrote: Reason for CRM: Patient is calling because she is needing to speak to someone about her paperwork. Could you assist? Patients callback number is [phone].

## 2024-09-06 NOTE — Telephone Encounter (Signed)
 Copied from CRM [id number]. Topic: Clinical - Prescription Issue >> Sep 06, 2024  2:00 PM Alfonso ORN wrote: Reason for CRM: Dulaglutide  (TRULICITY ) 0.75 MG/0.5ML SOAJ Dulaglutide  (TRULICITY ) 1.5 MG/0.5ML SOAJ (Starting on 10/04/2024)  Per patient her pharmacist stated that her insurance medicaid do not cover the medication ,

## 2024-09-06 NOTE — Telephone Encounter (Addendum)
 Return call to pt - stated the paperwork from [employer] was incomplete; I will ask [facility] to f/u. Then pt stated her aide received a text last night from [employer] stating pt will be going to a home (she thinks a nursing home). Pt stated she's competent and refuses going to a home. She wants to know if her doctor is aware?

## 2024-09-07 ENCOUNTER — Other Ambulatory Visit (HOSPITAL_COMMUNITY): Payer: Self-pay

## 2024-09-07 NOTE — Telephone Encounter (Signed)
 Had to reach out to insurance to redo PA.  New key created: [id number]

## 2024-09-07 NOTE — Telephone Encounter (Signed)
 Prior authorization submitted for TRULICITY  1.5MG  to [facility] via Latent.   Key: [id number]

## 2024-09-07 NOTE — Telephone Encounter (Signed)
 Copied from CRM [id number]. Topic: Clinical - Prescription Issue >> Sep 06, 2024  2:00 PM Alfonso ORN wrote: Reason for CRM: Dulaglutide  (TRULICITY ) 0.75 MG/0.5ML SOAJ Dulaglutide  (TRULICITY ) 1.5 MG/0.5ML SOAJ (Starting on 10/04/2024)  Per patient her pharmacist stated that her insurance medicaid do not cover the medication , >> Sep 07, 2024  9:34 AM Alfonso ORN wrote:  pt. insurance deny the  Dulaglutide  (TRULICITY ) 0.75 MG/0.5ML SOAJ, Dulaglutide  (TRULICITY ) 1.5 MG/0.5ML SOAJ (Starting on 10/04/2024) and ozempic    pt. stated provider keep getting prescription that can not get refill  per pt. stated the Trulicity  is $900

## 2024-09-07 NOTE — Telephone Encounter (Signed)
 Sending to [place] for any assistance.

## 2024-09-07 NOTE — Telephone Encounter (Signed)
Thanks Camille!

## 2024-09-07 NOTE — Telephone Encounter (Signed)
 Pharmacy Patient Advocate Encounter  Received notification from [facility] that Prior Authorization for TRULICITY  1.5MG  has been APPROVED from 09/07/24 to 09/07/25  However test claim still showing no coverage and that fill limit is exceeded.   Will reach out to insurance.  PA #/Case ID/Reference #: [id number]

## 2024-09-10 ENCOUNTER — Other Ambulatory Visit (HOSPITAL_COMMUNITY): Payer: Self-pay

## 2024-09-10 NOTE — Telephone Encounter (Signed)
 Patient informed that medication is ready for pickup.   Says her primary insurance should be managed medicaid [employer] on 09/12/24. Encouraged patient to contact [employer] after the first to make sure they are her primary insurance and no other insurance is on file so there is no delay in her coverage.

## 2024-09-10 NOTE — Telephone Encounter (Signed)
 Per [facility] rep claim is showing paid claim for trulicity  0.75mg  at [facility] pharmacy on 09/10/24.   Will inform patient.

## 2024-09-10 NOTE — Telephone Encounter (Signed)
 Unable to reach pt to discuss about the [employer] application for transportation. I left detailed message to call the clinic back.

## 2024-09-11 NOTE — Telephone Encounter (Signed)
 Pt called back to [facility] office today. Pt states Part A needed to be complete for scat application. Advised pt that part A has to be completed by patient. Pt states she will have her caregiver to filled that part out for her and resubmit the application. Advised pt to call back if she needs further assistance.

## 2024-09-17 ENCOUNTER — Telehealth: Payer: Self-pay | Admitting: *Deleted

## 2024-09-17 NOTE — Telephone Encounter (Signed)
 Ophthalmology referral was ordered 8/12.

## 2024-09-17 NOTE — Telephone Encounter (Signed)
 Copied from CRM #[id number]. Topic: General - Other >> Sep 14, 2024  1:49 PM Susanna ORN wrote: Reason for CRM: Patient calling in to find out who is her optometrist and states she needs to get an appt set up with them. Please give her a call to provide that information. CB #: [phone]

## 2024-09-21 ENCOUNTER — Ambulatory Visit: Payer: Self-pay

## 2024-09-21 NOTE — Telephone Encounter (Signed)
 I talked to pt who stated her Aide is getting her dressed and she will take her to the ER.

## 2024-09-21 NOTE — Telephone Encounter (Signed)
 FYI Only or Action Required?: FYI only for provider.  Patient was last seen in primary care on 08/28/2024 by Kandis Perkins, DO.  Called Nurse Triage reporting Neurologic Problem.  Symptoms began several days ago.  Interventions attempted: Rest, hydration, or home remedies.  Symptoms are: unchanged.  Triage Disposition: Go to ED Now (overriding Call EMS 911 Now)  Patient/caregiver understands and will follow disposition?: Yes Reason for Disposition  [1] Numbness (i.e., loss of sensation) of the face, arm / hand, or leg / foot on one side of the body AND [2] sudden onset AND [3] present now  Answer Assessment - Initial Assessment Questions Patient reports right hand numbness x 3 days. States does not effect right leg, but then stated she has chronic right leg neuropathy. Denies other symptoms including vision changes, headache, weakness, vision changes and is speaking in clear and full sentences.  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  Advised to go to the ED now, pt agreed. Stated she will have family member drive her vs EMS.  1. SYMPTOM: What is the main symptom you are concerned about? (e.g., weakness, numbness)     Right sided hand numbness  2. ONSET: When did this start? (e.g., minutes, hours, days; while sleeping)     3 days ago  3. LAST NORMAL: When was the last time you (the patient) were normal (no symptoms)?     3 days ago  4. PATTERN Does this come and go, or has it been constant since it started?  Is it present now?     Constant  5. CARDIAC SYMPTOMS: Have you had any of the following symptoms: chest pain, difficulty breathing, palpitations?     Denies  6. NEUROLOGIC SYMPTOMS: Have you had any of the following symptoms: headache, dizziness, vision loss, double vision, changes in speech, unsteady on your feet?     See note  7. OTHER SYMPTOMS: Do you have any other symptoms?     See note  Protocols  used: Neurologic [facility] Copied from CRM #[id number]. Topic: Clinical - Red Word Triage >> Sep 21, 2024  9:51 AM Marda MATSU wrote: Red Word that prompted transfer to Nurse Triage:  hx of stroke and diabetes,  currently experiencing  numbness in right hand

## 2024-09-26 ENCOUNTER — Ambulatory Visit: Payer: Self-pay | Admitting: Student

## 2024-09-26 NOTE — Telephone Encounter (Signed)
 Copied from CRM #[id number]. Topic: Clinical - Prescription Issue >> Sep 26, 2024 11:53 AM Farrel B wrote: Reason for CRM: From previous Reason for Contact - Medication Question: Reason for CRM: Lacosamide  (VIMPAT ) 100 MG TABS, which was prescribed by Dr. Marylu. Ms. Rosaria at [facility] stated the [facility] number for the provider has expired and another provider needs to sign off on this for them to fill the medication for the patient. Please call Ms. Dominique to advise.   -----------------------------------------------------------------------

## 2024-09-26 NOTE — Telephone Encounter (Signed)
 Attempted to call the pharmacy x2 to provide a new DEA number. Unable to reach the pharmacy.

## 2024-09-27 ENCOUNTER — Other Ambulatory Visit: Payer: Self-pay | Admitting: Student

## 2024-09-27 DIAGNOSIS — G40909 Epilepsy, unspecified, not intractable, without status epilepticus: Secondary | ICD-10-CM

## 2024-09-27 MED ORDER — LACOSAMIDE 200 MG PO TABS: 200.0000 mg | ORAL_TABLET | Freq: Two times a day (BID) | ORAL | 3 refills | Status: DC

## 2024-09-27 NOTE — Addendum Note (Signed)
 Addended by: Khanh Tanori on: 09/27/2024 04:59 PM   Modules accepted: Orders

## 2024-09-28 ENCOUNTER — Telehealth: Payer: Self-pay | Admitting: *Deleted

## 2024-09-28 MED ORDER — LACOSAMIDE 100 MG PO TABS: 200.0000 mg | ORAL_TABLET | Freq: Two times a day (BID) | ORAL | 11 refills | Status: DC

## 2024-09-28 NOTE — Telephone Encounter (Signed)
 Call to [facility] 200 mg tablets are unavailable would like new prescription for Vimpat  100mg  tablets take 2 tablets  2 times a day.  Will forward to PCP. Copied from CRM #[id number]. Topic: Clinical - Prescription Issue >> Sep 28, 2024 10:14 AM Marda MATSU wrote: [facility] calling regarding the RX: lacosamide  (VIMPAT ) 200 MG TABS tablet... Only 100 mg tablets are available.    Please advise  Patient is out of medication, per pharmacist.

## 2024-10-01 ENCOUNTER — Telehealth: Payer: Self-pay | Admitting: *Deleted

## 2024-10-01 NOTE — Telephone Encounter (Signed)
 Copied from CRM [id number]. Topic: General - Other >> Sep 28, 2024  1:49 PM Debby BROCKS wrote: Reason for CRM: Lauraine from [employer] called in stating that they have sent requests for Dr. Jimmy to provide the [place]  certificate of necessity so they may continue assisting the patient. They have not received anything back yet Callback: [phone]

## 2024-10-02 ENCOUNTER — Telehealth: Payer: Self-pay | Admitting: *Deleted

## 2024-10-02 ENCOUNTER — Encounter

## 2024-10-02 DIAGNOSIS — S069X9S Unspecified intracranial injury with loss of consciousness of unspecified duration, sequela: Secondary | ICD-10-CM

## 2024-10-02 NOTE — Telephone Encounter (Signed)
 Mammogram reminder letter mailed to the patient for Novermber 19/2025 @ 4:00 pm at the [facility]. [phone]

## 2024-10-02 NOTE — Telephone Encounter (Signed)
 Copied from CRM #[id number]. Topic: Clinical - Medical Advice >> Oct 02, 2024 10:09 AM Debby BROCKS wrote: Reason for CRM: Patient would to be advised in what other locations she can get diabetic shoes as well as finding out how to get a prescription for a leg brace so that one may be obtained for her

## 2024-10-05 NOTE — Telephone Encounter (Signed)
 Please see message below in Reference to a Referral being placed.   Please advise if the pt will need and appointment vs a Referral being placed.  Copied from CRM [id number]. Topic: Referral - Request for Referral >> Oct 04, 2024  2:31 PM Mercer PEDLAR wrote: Did the patient discuss referral with their provider in the last year? Yes (If No - schedule appointment) (If Yes - send message)  Appointment offered? No  Type of order/referral and detailed reason for visit: Nurse Rico from [facility] is calling to request a referral for a neuropsychological evaluation to assess cognitive status and ability to live independently in the community.  Preference of office, provider, location: any provider within network for insurance.   If referral order, have you been seen by this specialty before? No (If Yes, this issue or another issue? When? Where?  Can we respond through MyChart? No

## 2024-10-08 ENCOUNTER — Telehealth: Payer: Self-pay | Admitting: *Deleted

## 2024-10-08 NOTE — Telephone Encounter (Signed)
 Copied from CRM [id number]. Topic: General - Other >> Sep 28, 2024  1:49 PM Debby BROCKS wrote: Reason for CRM: Lauraine from [employer] called in stating that they have sent requests for Dr. Jimmy to provide the [place]  certificate of necessity so they may continue assisting the patient. They have not received anything back yet Callback: [phone] >> Oct 05, 2024  1:48 PM Alfonso ORN wrote: The client been in bed for  3 weeks trying to get power wheel chair repair, and its very unprofessional and waiting for provider to complete the paperwork  Please call  soon as possible sarah directly [phone] >> Oct 05, 2024  1:43 PM Alfonso ORN wrote:  [employer] calling on the status of the medical necessity .was fax on   09/18/24  called on 09/28/24 and was confirm that   it is available

## 2024-10-09 ENCOUNTER — Telehealth: Payer: Self-pay | Admitting: *Deleted

## 2024-10-09 NOTE — Telephone Encounter (Signed)
 Copied from CRM [id number]. Topic: General - Other >> Sep 28, 2024  1:49 PM Debby BROCKS wrote: Reason for CRM: Lauraine from [employer] called in stating that they have sent requests for Dr. Jimmy to provide the [place]  certificate of necessity so they may continue assisting the patient. They have not received anything back yet Callback: [phone] >> Oct 09, 2024 10:43 AM Chiquita SQUIBB wrote: Patient is calling in requesting an update on this as she has not heard anything regarding her chair. Please advise patient.  >> Oct 05, 2024  1:48 PM Alfonso ORN wrote: The client been in bed for  3 weeks trying to get power wheel chair repair, and its very unprofessional and waiting for provider to complete the paperwork  Please call  soon as possible sarah directly [phone] >> Oct 05, 2024  1:43 PM Alfonso ORN wrote:  [employer] calling on the status of the medical necessity .was fax on   09/18/24  called on 09/28/24 and was confirm that   it is available

## 2024-10-09 NOTE — Telephone Encounter (Unsigned)
 Copied from CRM [id number]. Topic: General - Other >> Oct 09, 2024  1:49 PM [name] wrote: Reason for CRM: Patient was returning a call for May. Could you please return her call at [phone].

## 2024-10-09 NOTE — Telephone Encounter (Unsigned)
 Copied from CRM [id number]. Topic: Referral - Status >> Oct 09, 2024 12:55 PM Diannia H wrote: Reason for CRM: Nurse Rico from [facility] is calling to check the status of a referral request for a neuropsychological evaluation to assess cognitive status and ability to live independently in the community. Could you assist? Shelly callback number is [phone].

## 2024-10-09 NOTE — Telephone Encounter (Signed)
 RTC to patient message left that the Clinics had called.  Need to see if fevers or cough are with the runny nose.   Copied from CRM [id number]. Topic: Clinical - Medical Advice >> Oct 09, 2024 10:41 AM [name] wrote: Reason for CRM: Patient is calling in stating she has a runny nose cold and was wondering if something could be called in for her.

## 2024-10-10 NOTE — Telephone Encounter (Signed)
 Message has been forwarded to Lela S. In reference to the patient's PCS form.   Copied from CRM [id number]. Topic: General - Other >> Oct 09, 2024  4:16 PM Debby BROCKS wrote: Reason for CRM: Delon bell  from [employer] called in stating she missed a call from [facility] with a message to confirm a fax number for the 3051 service order form for personal care for the patient. She advised it can be faxed over to:  Fax [phone]  Callback: [phone] Delon Edison

## 2024-10-11 NOTE — Telephone Encounter (Signed)
 RTC to patient message left that the Clinics had called to check on her symptoms.

## 2024-10-15 ENCOUNTER — Telehealth: Payer: Self-pay | Admitting: *Deleted

## 2024-10-15 NOTE — Telephone Encounter (Signed)
Thanks Chilon! 

## 2024-10-15 NOTE — Telephone Encounter (Signed)
 Copied from CRM [id number]. Topic: Referral - Request for Referral >> Oct 04, 2024  2:31 PM Mercer PEDLAR wrote: Did the patient discuss referral with their provider in the last year? Yes (If No - schedule appointment) (If Yes - send message)  Appointment offered? No  Type of order/referral and detailed reason for visit: Nurse Rico from [facility] is calling to request a referral for a neuropsychological evaluation to assess cognitive status and ability to live independently in the community.  Preference of office, provider, location: any provider within network for insurance.   If referral order, have you been seen by this specialty before? No (If Yes, this issue or another issue? When? Where?  Can we respond through MyChart? No >> Oct 15, 2024 11:13 AM Chiquita SQUIBB wrote: Patient is calling in stating she received the voicemail and is asking if the office can get her a sooner appointment for the neurology referral

## 2024-10-18 ENCOUNTER — Telehealth: Payer: Self-pay | Admitting: *Deleted

## 2024-10-18 NOTE — Telephone Encounter (Signed)
 Spoke with patient to inform her that  paper work beenf faxed and had to be refaxed due to them having a new form / faxed via FAXCOM to [employer]- fax #  [phone]   phone # [phone]. Patient aware to give their office at least 2-3  weeks before contact / [place] [facility] phone number was given to the patient so she can call the office her self if she has not heard from the office.

## 2024-10-19 ENCOUNTER — Ambulatory Visit: Payer: MEDICAID | Admitting: Student

## 2024-10-19 NOTE — Assessment & Plan Note (Deleted)
 SABRA

## 2024-10-19 NOTE — Assessment & Plan Note (Deleted)
 Status: {statusupdate:33856}.Last A1c 9.0 in 8/25.  A1c today is***.  Currently taking Lantus  56 units***, GLP-1??.  No SGLT2 per prior notes due to recurrent UTI. Monitor: ***. Patient *** hypoglycemia.  Per review of ***, average blood glucose is***.  Average fasting blood glucose is***. Foot exam ***.   Plan -Continue *** -A1c *** -Ophthalmology exam: *** -Urine ACR: 08/2024 -LDL 103 on 07/2024, at that time changed to Crestor  40 mg

## 2024-10-19 NOTE — Assessment & Plan Note (Deleted)
 [name]

## 2024-10-19 NOTE — Progress Notes (Deleted)
 CC: ***  HPI: [name] is a 58 y.o. female living with a history stated below and presents today for ***. Please see problem based assessment and plan for additional details.     Discussed the use of AI scribe software for clinical note transcription with the patient, who gave verbal consent to proceed.  History of Present Illness    {History (Optional):23778}  Review of Systems: ROS negative except for what is noted on the assessment and plan.     Past Medical History:  Diagnosis Date  . Acute cystitis 06/19/2021  . AKI (acute kidney injury) 09/13/2022  . AKI (acute kidney injury) 09/13/2022  . Allergic rhinitis   . Arthritis    hips, back, legs, arms (07/04/2014)  . Asthma    hx  . Automatic implantable cardioverter-defibrillator in situ   . Calcifying tendinitis of shoulder   . Chronic combined systolic and diastolic CHF (congestive heart failure) (HCC)    EF 40-45% by echo 12/06/2012  . Chronic diastolic heart failure (HCC)     Primarily diastolic CHF: Likely due to uncontrolled HTN. Last echo (8/12) with EF 45-50%, mild to moderate LVH with some asymmetric septal hypertrophy, RV normal size and systolic function. EF 50-55% by LV-gram in 6/12.   . Chronic lower back pain    secondary to DJD, obsetiy, hip problems. Followed by Dr. Zach Swartz (pain management)  . Chronic systolic heart failure (HCC) 05/17/2022  . Chronic use of opiate for therapeutic purpose 12/14/2016  . Contact with and (suspected) exposure to covid-19 02/07/2020  . Coronary artery disease    questionable. LHC 05/2011 showing normal coronaries // Followed at [facility], Dr. Rolan  . Cough 11/02/2022  . Degeneration of lumbar or lumbosacral intervertebral disc   . Diabetic foot infection (HCC) 03/05/2023  . DJD (degenerative joint disease) of hip    right sided  . Fatigue 04/27/2022  . Frequent UTI   . GERD (gastroesophageal reflux disease)   . HLD (hyperlipidemia)   .  Hypertension    Poorly controlled. Has had HTN since age 42. Angioedema with ACEI.  24 Hr urine and renal arterial dopplers ordered . . . Never done  . LBBB (left bundle branch block)   . Left shoulder pain 06/30/2015  . Left spastic hemiparesis (HCC) 09/23/2015  . Liver disease   . Lumbago 11/22/2016  . Morbid obesity (HCC)   . Muscle spasm 06/19/2021  . Need for pneumococcal vaccine 09/09/2017  . NICM (nonischemic cardiomyopathy) (HCC)    EF 45-50% in 8/12, cath 6/12 showed normal coronaries, EF 50-55% by LV gram  . OSA on CPAP    sleep study in 8/12 showed moderate to severe OSA requiring CPAP  . Perimenopausal 03/28/2017  . Polyneuropathy in diabetes(357.2)   . 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 Presence of permanent cardiac pacemaker   . Rash 07/14/2016  . Seizures (HCC)    last 3 months  . Shortness of breath    none now  . Stroke [facility]) 12/2013   my left side is paralyzed (07/04/2014)  . Tachycardia 07/04/2014  . Thoracic or lumbosacral neuritis or radiculitis, unspecified   . Tinea cruris 09/13/2022  . Type II diabetes mellitus (HCC) DX: 2002  . Urinary frequency 11/05/2014  . Vaginal discharge 02/05/2021    Current Outpatient Medications on File Prior to Visit  Medication Sig Dispense Refill  . Accu-Chek Softclix Lancets lancets Use to check blood sugar as directed 102 each 12  .  albuterol  (VENTOLIN  HFA) 108 (90 Base) MCG/ACT inhaler Inhale 2 puffs into the lungs every 4 (four) hours as needed for wheezing or shortness of breath. 36 g 0  . apixaban  (ELIQUIS ) 5 MG TABS tablet Take 1 tablet (5 mg total) by mouth 2 (two) times daily. 120 tablet 5  . ascorbic acid  (VITAMIN C ) 500 MG tablet Take 2 tablets (1,000 mg total) by mouth daily. 100 tablet 0  . Blood Glucose Monitoring Suppl (ACCU-CHEK GUIDE) w/Device KIT Use As Directed 1 kit 0  . Continuous Glucose Sensor (FREESTYLE LIBRE 3 PLUS SENSOR) MISC Change sensor every 15 days. 6 each 3  .  cyanocobalamin  1000 MCG tablet Take 1 tablet by mouth daily.    . cyclobenzaprine  (FLEXERIL ) 10 MG tablet Take 1 tablet (10 mg total) by mouth 2 (two) times daily as needed for muscle spasms. 60 tablet 1  . diclofenac  Sodium (VOLTAREN ) 1 % GEL Apply 4 g topically 4 (four) times daily as needed (Pain). Left knee 100 g 1  . Dulaglutide  (TRULICITY ) 1.5 MG/0.5ML SOAJ Inject 1.5 mg into the skin once a week. 2 mL 1  . escitalopram  (LEXAPRO ) 10 MG tablet Take 1 tablet (10 mg total) by mouth daily. 90 tablet 3  . famotidine  (PEPCID ) 20 MG tablet Take 1 tablet (20 mg total) by mouth 2 (two) times daily. 60 tablet 3  . feeding supplement (ENSURE ENLIVE / ENSURE PLUS) LIQD Take 237 mLs by mouth 2 (two) times daily between meals.    . gabapentin  (NEURONTIN ) 300 MG capsule Take 1 capsule (300 mg total) by mouth 3 (three) times daily. 180 capsule 5  . glucose blood (ACCU-CHEK GUIDE TEST) test strip Use as instructed 100 each 12  . GNP ULTICARE PEN NEEDLES 31G X 8 MM MISC Inject 1 each into the skin 4 (four) times daily. 200 each 0  . Insulin  Glargine (BASAGLAR  KWIKPEN) 100 UNIT/ML Inject 56 Units into the skin daily. 15 mL 11  . isosorbide -hydrALAZINE  (BIDIL ) 20-37.5 MG tablet Take 1 tablet by mouth 3 (three) times daily. 90 tablet 11  . Lacosamide  (VIMPAT ) 100 MG TABS Take 2 tablets (200 mg total) by mouth 2 (two) times daily. 120 tablet 11  . lacosamide  (VIMPAT ) 200 MG TABS tablet Take 1 tablet (200 mg total) by mouth 2 (two) times daily. 180 tablet 3  . levETIRAcetam  (KEPPRA ) 500 MG tablet Take 1 tablet (500 mg total) by mouth 2 (two) times daily. 180 tablet 3  . linaclotide  (LINZESS ) 290 MCG CAPS capsule Take 1 capsule (290 mcg total) by mouth daily before breakfast. 30 capsule 3  . melatonin 3 MG TABS tablet Take 3 mg by mouth at bedtime.    . metoprolol  succinate (TOPROL  XL) 100 MG 24 hr tablet Take 1 tablet (100 mg total) by mouth daily. Take with or immediately following a meal. 30 tablet 2  . nutrition  supplement, JUVEN, (JUVEN) PACK Take 1 packet by mouth 2 (two) times daily between meals.    . Nystatin  (GERHARDT'S BUTT CREAM) CREA Apply 1 Application topically 2 (two) times daily.    . polyethylene glycol (MIRALAX  / GLYCOLAX ) 17 g packet Take 17 g by mouth daily as needed.    . PREMARIN  vaginal cream Place 0.5 (1gram) Applicatorfuls vaginally 3 (three) times a week. ** This is 70 DAYS supply ** 30 g 2  . psyllium (HYDROCIL/METAMUCIL) 95 % PACK Take 1 packet by mouth daily.    . rosuvastatin  (CRESTOR ) 40 MG tablet Take 1 tablet (40 mg  total) by mouth daily. 90 tablet 3  . senna (SENOKOT) 8.6 MG TABS tablet Take 1 tablet (8.6 mg total) by mouth 2 (two) times daily as needed for moderate constipation.    . spironolactone  (ALDACTONE ) 25 MG tablet Take 1 tablet (25 mg total) by mouth daily. 90 tablet 3  . talc  powder Apply topically as needed. 240 g 0  . Vitamin D , Ergocalciferol , (DRISDOL ) 1.25 MG (50000 UNIT) CAPS capsule TAKE ONE CAPSULE BY MOUTH EVERY 7 DAYS 5 capsule 5  . Zinc  Sulfate 220 (50 Zn) MG TABS Take 1 tablet (220 mg total) by mouth daily. 100 tablet 0   No current facility-administered medications on file prior to visit.    Social History   Socioeconomic History  . Marital status: Single    Spouse name: Not on file  . Number of children: 2  . Years of education: 9th grade  . Highest education level: Not on file  Occupational History  . Occupation: Unemployed    Comment: planning on getting disability  Tobacco Use  . Smoking status: Never    Passive exposure: Current  . Smokeless tobacco: Never  Vaping Use  . Vaping status: Never Used  Substance and Sexual Activity  . Alcohol  use: No    Alcohol /week: 0.0 standard drinks of alcohol   . Drug use: No  . Sexual activity: Yes    Partners: Male    Birth control/protection: Surgical, Post-menopausal    Comment: menarche 58yo, sexual debut 58yo  Other Topics Concern  . Not on file  Social History Narrative   Lives in  [place] with her son. Is able to read and write fluently in English.   Social Drivers of Health   Financial Resource Strain: Medium Risk (03/23/2023)   Overall Financial Resource Strain (CARDIA)   . Difficulty of Paying Living Expenses: Somewhat hard  Food Insecurity: No Food Insecurity (03/02/2024)   Hunger Vital Sign   . Worried About Programme Researcher, Broadcasting/film/video in the Last Year: Never true   . Ran Out of Food in the Last Year: Never true  Transportation Needs: Unmet Transportation Needs (03/02/2024)   PRAPARE - Transportation   . Lack of Transportation (Medical): Yes   . Lack of Transportation (Non-Medical): Yes  Physical Activity: Insufficiently Active (03/23/2023)   Exercise Vital Sign   . Days of Exercise per Week: 3 days   . Minutes of Exercise per Session: 20 min  Stress: Not on file  Social Connections: Moderately Isolated (03/02/2024)   Social Connection and Isolation Panel   . Frequency of Communication with Friends and Family: More than three times a week   . Frequency of Social Gatherings with Friends and Family: Three times a week   . Attends Religious Services: More than 4 times per year   . Active Member of Clubs or Organizations: No   . Attends Banker Meetings: Never   . Marital Status: Never married  Intimate Partner Violence: Not At Risk (03/02/2024)   Humiliation, Afraid, Rape, and Kick questionnaire   . Fear of Current or Ex-Partner: No   . Emotionally Abused: No   . Physically Abused: No   . Sexually Abused: No      There were no vitals filed for this visit.  Physical Exam  Physical Exam: Constitutional: well-appearing *** sitting in ***, in no acute distress HENT: normocephalic atraumatic, mucous membranes moist Eyes: conjunctiva non-erythematous Cardiovascular: regular rate and rhythm, no m/r/g Pulmonary/Chest: normal work of breathing on room air, lungs  clear to auscultation bilaterally Abdominal: soft, non-tender, non-distended MSK:  *** Neurological: alert & oriented x 3, 5/5 strength in bilateral upper and lower extremities, normal gait Skin: warm and dry Psych: ***     Assessment & Plan:   PMH: Nonischemic cardiomyopathy (HFrEF), HTN, A-fib on Eliquis , OSA, T2DM on insulin  therapy, TBI with SDH, seizure, CVA with left residual hemiparesis, vitamin D  and B12 deficiency  CC: Discussed necessity of a new power wheelchair due to insurance denial.  Medication review and management follow-up.  Medications Albuterol  Eliquis  5 mg twice daily B12 Flexeril ? Voltaren  gel Trulicity  1.5 mg Lexapro  10 mg Pepcid  20 mg twice daily Gabapentin  300 mg 3 times daily Basaglar  56 units daily Vital 20-37.5 mg 3 times daily Vimpat  200 mg twice daily Keppra  500 mg twice daily Linzess  to 90 mcg Melatonin 3 mg Metoprolol  100 mg daily Rosuvastatin  40 mg MiraLAX  and Senokot Spironolactone  25 mg  Assessment & Plan Type 2 diabetes mellitus with hyperglycemia, with long-term current use of insulin  (HCC) Status: {statusupdate:33856}.Last A1c 9.0 in 8/25.  A1c today is***.  Currently taking Lantus  56 units***, GLP-1??.  No SGLT2 per prior notes due to recurrent UTI. Monitor: ***. Patient *** hypoglycemia.  Per review of ***, average blood glucose is***.  Average fasting blood glucose is***. Foot exam ***.   Plan -Continue *** -A1c *** -Ophthalmology exam: *** -Urine ACR: 08/2024 -LDL 103 on 07/2024, at that time changed to Crestor  40 mg    Hemiparesis affecting left side as late effect of cerebrovascular accident (HCC)     Benign essential HTN        Assessment and Plan Assessment & Plan      No orders of the defined types were placed in this encounter.  No follow-ups on file.    Patient {GC/GE:[id number]::discussed with,seen with} Dr. {WJFZD:[id number]::Tpoopjfd,Z. Hoffman,Winfrey,Narendra,Chun,Chambliss,Lau,Machen}  Ozell Nearing, D.O. [facility] Internal Medicine, PGY-3 Clinic Phone:  [phone] Date 10/19/2024 Time 7:38 AM

## 2024-10-23 ENCOUNTER — Other Ambulatory Visit: Payer: Self-pay | Admitting: Student

## 2024-10-23 ENCOUNTER — Telehealth: Payer: Self-pay

## 2024-10-23 NOTE — Telephone Encounter (Unsigned)
 Copied from CRM [id number]. Topic: Clinical - Prescription Issue >> Oct 23, 2024  5:06 PM DeAngela L wrote: Reason for CRM: [name] with [facility] calling cause the pantoprazole  40 mg written by doctor that's not cover by [facility] and they need a new prescription written for the patient to get a fill   [facility] - [place], [place] - [address] [place] [address] Phone: [phone] Fax: [phone]

## 2024-10-24 ENCOUNTER — Other Ambulatory Visit: Payer: Self-pay | Admitting: Internal Medicine

## 2024-10-24 ENCOUNTER — Telehealth: Payer: Self-pay

## 2024-10-24 DIAGNOSIS — Z7901 Long term (current) use of anticoagulants: Secondary | ICD-10-CM

## 2024-10-24 MED ORDER — PANTOPRAZOLE SODIUM 40 MG PO TBEC: 40.0000 mg | DELAYED_RELEASE_TABLET | Freq: Every day | ORAL | 3 refills | Status: AC

## 2024-10-24 NOTE — Telephone Encounter (Signed)
 Will forward to Red Team. Dr. Marylu is not covered by [employer].

## 2024-10-24 NOTE — Telephone Encounter (Signed)
 COURTNI BALASH (Key: [id number]) PA Case ID #: [id number] Rx #: [id number] Need Help? Call us  at [phone] Outcome Approved today by [employer] 2017 Approved. FREESTYLE LIBRE 3 PLUS SENSOR Misc is approved from 10/24/2024 to 04/23/2025. All strengths of the drug are approved. Effective Date: 10/24/2024 Authorization Expiration Date: 04/23/2025 Drug FreeStyle Libre 3 Plus Sensor ePA cloud logo Form [employer] Electronic Prior Authorization Form Original Claim Info 75  lvm regarding the approval.

## 2024-10-24 NOTE — Telephone Encounter (Signed)
 Prior Authorization for patient (FreeStyle Libre 3 Plus Sensor) came through on cover my meds was submitted with last office notes and labs awaiting approval or denial.  XZB:[id number]

## 2024-10-25 NOTE — Progress Notes (Unsigned)
 Patient name: [name] Date of birth: 09-19-1966 Date of visit: 10/26/24  Type of visit: Established Patient Office Visit  Subjective   Chief concern:  Chief Complaint  Patient presents with   Discuss about PWC.    RODNEISHA BONNET is a 58 y.o. female with a PMHx of CVA with residual left hemiparesis, poststroke epilepsy, SDH d/t trauma, chronic combined systolic and diastolic HF, NICM, s/p ICD, T2DM, OSA, HTN, HLD, Depression, Anxiety who presents to [facility] clinic to discuss paperwork for power wheelchair.  The patient's previous attempt to obtain power wheelchair was denied. The patient also needs neuropsychological testing. No other acute complaints at this time.  See assessment and plan for further details.   Patient Active Problem List   Diagnosis Date Noted   On continuous oral anticoagulation 04/08/2024   Severe comorbid illness 04/05/2024   Subdural hemorrhage (HCC) 04/05/2024   Post traumatic seizure disorder (HCC) 04/02/2024   On tube feeding diet 03/29/2024   Seizure disorder as sequela of cerebrovascular accident (HCC) 03/29/2024   Pressure injury of buttock, stage 2 (HCC) 03/26/2024   Pressure injury of coccygeal region, stage 2 (HCC) 03/26/2024   Traumatic brain injury (HCC) 03/23/2024   Status epilepticus (HCC), resolved 03/09/2024   Seizures, post-traumatic (HCC) 03/07/2024   Subdural hematoma (HCC) 03/02/2024   Osteomyelitis of fifth toe of right foot (HCC) 05/16/2023   Type 2 diabetes mellitus with hyperglycemia, with long-term current use of insulin  (HCC) 03/07/2023   PTSD (post-traumatic stress disorder) 01/20/2023   Anxiety state 01/20/2023   Insomnia due to other mental disorder 01/20/2023   Housing instability 11/18/2022   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   Atrial fibrillation (HCC) 09/13/2019   Vitamin B12 deficiency 09/15/2017   Lumbar radiculopathy 08/03/2017   Constipation 09/09/2015   Localization-related symptomatic epilepsy and  epileptic syndromes with complex partial seizures, not intractable, without status epilepticus (HCC) 06/13/2015   Hemiparesis affecting left side as late effect of cerebrovascular accident (HCC) 06/13/2015   Morbid obesity (HCC) 06/13/2015   Intertrigo 11/05/2014   Vitamin D  deficiency 10/01/2014   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/23/2013   Mixed stress and urge incontinence 10/11/2013   Health care maintenance 09/13/2013   Chronic combined systolic and diastolic heart failure (HCC) 08/01/2013   Nonischemic cardiomyopathy (HCC) 08/01/2013   LBBB (left bundle branch block) 07/19/2013   Depression 04/09/2013   Primary osteoarthritis of left knee 04/14/2012   Lumbar spondylosis 04/14/2012   Poorly controlled type 2 diabetes mellitus with neuropathy (HCC) 04/14/2012   Hyperlipidemia 11/23/2011   OSA (obstructive sleep apnea) 08/27/2011   Benign essential HTN 08/27/2010     Past Surgical History:  Procedure Laterality Date   AMPUTATION Right 05/18/2023   Procedure: RIGHT FOTT 5TH TOE AMPUTATION;  Surgeon: Harden Jerona GAILS, MD;  Location: [facility] OR;  Service: Orthopedics;  Laterality: Right;   BI-VENTRICULAR IMPLANTABLE CARDIOVERTER DEFIBRILLATOR N/A 08/22/2013   Procedure: BI-VENTRICULAR IMPLANTABLE CARDIOVERTER DEFIBRILLATOR  (CRT-D);  Surgeon: Elspeth JAYSON Sage, MD;  Location: [facility] CATH LAB;  Service: Cardiovascular;  Laterality: N/A;   BI-VENTRICULAR IMPLANTABLE CARDIOVERTER DEFIBRILLATOR  (CRT-D)  08/2013   thelbert 08/23/2013   BIV ICD GENERATOR CHANGEOUT N/A 03/18/2021   Procedure: BIV ICD GENERATOR CHANGEOUT;  Surgeon: Sage Elspeth JAYSON, MD;  Location: [facility] INVASIVE CV LAB;  Service: Cardiovascular;  Laterality: N/A;   BREAST SURGERY Bilateral 2011   patient reports benign results   CARDIAC CATHETERIZATION  05/2011   CARPAL TUNNEL RELEASE Left    denies   HEMIARTHROPLASTY  SHOULDER FRACTURE Right 1980's   denies   I & D EXTREMITY Right 03/06/2023   Procedure: IRRIGATION AND  DEBRIDEMENT OF RIGHT FOOT;  Surgeon: Beverley Evalene BIRCH, MD;  Location: [facility] OR;  Service: Orthopedics;  Laterality: Right;   I & D EXTREMITY Right 03/10/2023   Procedure: RIGHT FOOT INCISION AND DEBRIDEMENT;  Surgeon: Beverley Evalene BIRCH, MD;  Location: [facility] OR;  Service: Orthopedics;  Laterality: Right;   MULTIPLE EXTRACTIONS WITH ALVEOLOPLASTY Bilateral 05/20/2017   Procedure: MULTIPLE EXTRACTION;  Surgeon: Sheryle Hamilton, DDS;  Location: [facility] OR;  Service: Oral Surgery;  Laterality: Bilateral;   MULTIPLE TOOTH EXTRACTIONS  ~ 2011   tumors removed ; my whole top   SHOULDER ARTHROSCOPY Right 12/26/2015   Procedure: Right Shoulder Arthroscopy, Debridement, and Decompression;  Surgeon: Jerona Harden GAILS, MD;  Location: [facility] OR;  Service: Orthopedics;  Laterality: Right;   TEE WITHOUT CARDIOVERSION N/A 01/14/2014   Procedure: TRANSESOPHAGEAL ECHOCARDIOGRAM (TEE);  Surgeon: Jerel Balding, MD;  Location: [facility] ENDOSCOPY;  Service: Cardiovascular;  Laterality: N/A;   TUBAL LIGATION  05/31/1985    ROS negative unless otherwise indicated in the HPI or Assessment and Plan.  Current Outpatient Medications  Medication Instructions   Accu-Chek Softclix Lancets lancets Use to check blood sugar as directed   albuterol  (VENTOLIN  HFA) 108 (90 Base) MCG/ACT inhaler 2 puffs, Inhalation, Every 4 hours PRN   apixaban  (ELIQUIS ) 5 mg, Oral, 2 times daily   ascorbic acid  (VITAMIN C ) 1,000 mg, Oral, Daily   Basaglar  KwikPen 56 Units, Subcutaneous, Daily   Blood Glucose Monitoring Suppl (ACCU-CHEK GUIDE) w/Device KIT Use As Directed   Continuous Glucose Sensor (FREESTYLE LIBRE 3 PLUS SENSOR) MISC Change sensor every 15 days.   cyanocobalamin  1000 MCG tablet 1 tablet, Daily   cyclobenzaprine  (FLEXERIL ) 10 mg, Oral, 2 times daily PRN   diclofenac  Sodium (VOLTAREN ) 4 g, Topical, 4 times daily PRN, Left knee   escitalopram  (LEXAPRO ) 10 mg, Oral, Daily   famotidine  (PEPCID ) 20 mg, Oral, 2 times daily   feeding supplement (ENSURE ENLIVE  / ENSURE PLUS) LIQD 237 mLs, Oral, 2 times daily between meals   gabapentin  (NEURONTIN ) 300 mg, Oral, 3 times daily   glucose blood (ACCU-CHEK GUIDE TEST) test strip Use as instructed   GNP ULTICARE PEN NEEDLES 31G X 8 MM MISC 1 each, Subcutaneous, 4 times daily   isosorbide -hydrALAZINE  (BIDIL ) 20-37.5 MG tablet 1 tablet, Oral, 3 times daily   lacosamide  (VIMPAT ) 200 mg, Oral, 2 times daily   Lacosamide  (VIMPAT ) 200 mg, Oral, 2 times daily   levETIRAcetam  (KEPPRA ) 500 mg, Oral, 2 times daily   linaclotide  (LINZESS ) 290 mcg, Oral, Daily before breakfast   melatonin 3 mg, Daily at bedtime   metoprolol  succinate (TOPROL  XL) 100 mg, Oral, Daily, Take with or immediately following a meal.   nutrition supplement, JUVEN, (JUVEN) PACK 1 packet, Oral, 2 times daily between meals   Nystatin  (GERHARDT'S BUTT CREAM) CREA 1 Application, Topical, 2 times daily   pantoprazole  (PROTONIX ) 40 mg, Oral, Daily   polyethylene glycol (MIRALAX  / GLYCOLAX ) 17 g, Oral, Daily PRN   PREMARIN  vaginal cream Place 0.5 (1gram) Applicatorfuls vaginally 3 (three) times a week. ** This is 70 DAYS supply **   psyllium (HYDROCIL/METAMUCIL) 95 % PACK 1 packet, Oral, Daily   rosuvastatin  (CRESTOR ) 40 mg, Oral, Daily   senna (SENOKOT) 8.6 mg, Oral, 2 times daily PRN   spironolactone  (ALDACTONE ) 25 mg, Oral, Daily   talc  powder Topical, As needed   Trulicity  1.5 mg,  Subcutaneous, Weekly   Vitamin D , Ergocalciferol , (DRISDOL ) 1.25 MG (50000 UNIT) CAPS capsule TAKE ONE CAPSULE BY MOUTH EVERY 7 DAYS   Zinc  Sulfate 220 mg, Oral, Daily    Social History   Tobacco Use   Smoking status: Never    Passive exposure: Current   Smokeless tobacco: Never  Vaping Use   Vaping status: Never Used  Substance Use Topics   Alcohol  use: No    Alcohol /week: 0.0 standard drinks of alcohol    Drug use: No      Objective  Today's Vitals   10/26/24 1018  BP: 129/85  Pulse: (!) 114  Temp: 98.1 F (36.7 C)  TempSrc: Oral  SpO2: 96%   Weight: 237 lb 3.2 oz (107.6 kg)  Height: 5' 9 (1.753 m)  Body mass index is 35.03 kg/m.   Physical Exam: Constitutional: well-appearing, well-nourished; weight; no acute distress HENT: normocephalic atraumatic, mucous membranes moist Eyes: conjunctiva non-erythematous Neurological: alert & oriented x 3.  Right upper extremity and right lower extremity 5/5 in strength.  Left upper extremity strength grossly 0/5.  Left lower extremity strength 1/5.  Decreased sensation to LUE and LLE compared to right side. Skin: warm and dry Extremities: BLE without edema or erythema.    Assessment & Plan  Hemiparesis affecting left side as late effect of cerebrovascular accident [facility]) Assessment & Plan: Patient presents to clinic in a manual wheelchair with friend Leslee.  The current power wheelchair that she has is unsafe for use.  It is currently dysfunctional as there are missing parts including wheels, nuts, and bolts.  The patient states that she frequently falls with normal use of this powered wheelchair as it loses balance.  She has attempted to have the power wheelchair repaired several times by medical repair shop in [place], however, the patient has not heard over from this medical supply company in over a month. Additionally, the patient does not have a working seatbelt in her current power wheelchair.  Due to fall risk/safety concerns, it is of my medical opinion that the patient should not use her current dysfunctional power wheelchair.  She has a medical need for a new power wheelchair. On my exam, the patient has weakness or abnormal muscle tone in the trunk, body, or musculature resulting in significantly impaired function in those muscles.  The patient is also unable to actively maintain proper trunk and extremity positioning without use of a power wheelchair.  Specifically, the patient has left hemiparesis in the left upper extremity and left lower extremity.  Left upper extremity  strength is 0/5 diffusely.  She is unable to move this extremity at all.  Left lower extremity strength is 1/5 diffusely.  It is evident that the patient is unable to safely use a manual wheelchair for mobility as she does not have capable strength nor dexterity on the left side of her body due to previous cerebrovascular accident. The patient does require assistance with ADLs and IADLs including bathing and dressing currently.  I still do not believe it is safe for her to live alone and she requires continued use of home health services/live-in aide.  However, with the use of a power wheelchair with controls on the right side, the patient is able to improve mobility to the point that she would be able to adequately attend doctors appointments.  Currently, without a functioning power wheelchair, the patient is unable to attend appointments without assistance. Given her multiple medical comorbidities, a new power wheelchair is necessary for her  to receive adequate continued specialized medical care. In regards to her need for neuropsychological testing, encouraged the patient to call her neurologist office for a sooner appointment.  She currently has appointment scheduled with Dr. Pastor Falling on 01/08/2025.  Plan: - Reached out to referral coordinator regarding power wheelchair approval - Follow up with Neurology for neuropsychology testing     Return in about 3 months (around 01/26/2025) for Power wheelchair/HH services after neuropsychological testing.   Patient case discussed with Dr. Karna.  Zyniah Ferraiolo, MD [facility]  PGY-1 10/26/2024, 2:25 PM

## 2024-10-26 ENCOUNTER — Ambulatory Visit (INDEPENDENT_AMBULATORY_CARE_PROVIDER_SITE_OTHER): Payer: MEDICAID

## 2024-10-26 ENCOUNTER — Telehealth: Payer: Self-pay

## 2024-10-26 VITALS — BP 129/85 | HR 114 | Temp 98.1°F | Ht 69.0 in | Wt 237.2 lb

## 2024-10-26 DIAGNOSIS — Z9181 History of falling: Secondary | ICD-10-CM | POA: Diagnosis not present

## 2024-10-26 DIAGNOSIS — Z0279 Encounter for issue of other medical certificate: Secondary | ICD-10-CM | POA: Diagnosis not present

## 2024-10-26 DIAGNOSIS — Z7409 Other reduced mobility: Secondary | ICD-10-CM

## 2024-10-26 DIAGNOSIS — I69354 Hemiplegia and hemiparesis following cerebral infarction affecting left non-dominant side: Secondary | ICD-10-CM

## 2024-10-26 NOTE — Telephone Encounter (Signed)
 Patient is over due and needs apt. Please place note in apt notes to get reconnect monitor.

## 2024-10-26 NOTE — Patient Instructions (Signed)
 Thank you, [name] [name] for allowing us  to provide your care today. Today we discussed the following:  - I have received documents from [facility] seating and mobility as well as [employer] as to why you are power wheelchair request was denied.  I will document everything we discussed during this visit and your need for a new power wheelchair. - Please do not use your current power wheelchair as it is unsafe for use and significantly increases your fall risk. - For neuropsychological testing, please call your neurology office (Dr. Pastor Falling with [facility]).  The phone number to his office is [phone]. - Dr. Janean office address is: [address],  [place], [place] [address]  I have ordered the following labs for you:  Lab Orders  No laboratory test(s) ordered today     Imaging tests ordered today:         Imaging Orders  No imaging studies ordered today    Referrals ordered today:   Referral Orders  No referral(s) requested today     I have ordered the following medication/changed the following medications:   Stop the following medications: There are no discontinued medications.   Start the following medications: No orders of the defined types were placed in this encounter.    Follow up: 3 months    Remember: Please call your neurologist office to get a sooner appointment for neuropsychological testing.  Should you have any questions or concerns please call the Internal Medicine Clinic at [phone].     Makael Stein, MD [facility]

## 2024-10-26 NOTE — Assessment & Plan Note (Addendum)
 Patient presents to clinic in a manual wheelchair with friend Leslee.  The current power wheelchair that she has is unsafe for use.  It is currently dysfunctional as there are missing parts including wheels, nuts, and bolts.  The patient states that she frequently falls with normal use of this powered wheelchair as it loses balance.  She has attempted to have the power wheelchair repaired several times by medical repair shop in [place], however, the patient has not heard over from this medical supply company in over a month. Additionally, the patient does not have a working seatbelt in her current power wheelchair.  Due to fall risk/safety concerns, it is of my medical opinion that the patient should not use her current dysfunctional power wheelchair.  She has a medical need for a new power wheelchair. On my exam, the patient has weakness or abnormal muscle tone in the trunk, body, or musculature resulting in significantly impaired function in those muscles.  The patient is also unable to actively maintain proper trunk and extremity positioning without use of a power wheelchair.  Specifically, the patient has left hemiparesis in the left upper extremity and left lower extremity.  Left upper extremity strength is 0/5 diffusely.  She is unable to move this extremity at all.  Left lower extremity strength is 1/5 diffusely.  It is evident that the patient is unable to safely use a manual wheelchair for mobility as she does not have capable strength nor dexterity on the left side of her body due to previous cerebrovascular accident. The patient does require assistance with ADLs and IADLs including bathing and dressing currently.  I still do not believe it is safe for her to live alone and she requires continued use of home health services/live-in aide.  However, with the use of a power wheelchair with controls on the right side, the patient is able to improve mobility to the point that she would be able to  adequately attend doctors appointments.  Currently, without a functioning power wheelchair, the patient is unable to attend appointments without assistance. Given her multiple medical comorbidities, a new power wheelchair is necessary for her to receive adequate continued specialized medical care. In regards to her need for neuropsychological testing, encouraged the patient to call her neurologist office for a sooner appointment.  She currently has appointment scheduled with Dr. Pastor Falling on 01/08/2025.  Plan: - Reached out to referral coordinator regarding power wheelchair approval - Follow up with Neurology for neuropsychology testing

## 2024-10-29 ENCOUNTER — Telehealth: Payer: Self-pay | Admitting: Neurology

## 2024-10-29 NOTE — Telephone Encounter (Signed)
 Call to [facility] with [facility]. Patient needing neuropsych eval to determine if patient is safe to live on her own. Advised I would send to Dr Gregg to review. PCP not willing to order.

## 2024-10-29 NOTE — Telephone Encounter (Signed)
 I would need to see them first to do a cognitive test and determine that neuropsych is warranted. I saw the patient for seizure.

## 2024-10-29 NOTE — Telephone Encounter (Signed)
 Patient called to check if earlier appointment due to needing to be scheduled earlier to proceed with [facility] process to help with housing.  [facility] has been put on hold until patient seen Dr. Gregg. Patient giving phone number for [facility] if need more details for reason on hold. [facility] Phone [phone]

## 2024-10-29 NOTE — Progress Notes (Signed)
 Internal Medicine Clinic Attending  Case discussed with the resident at the time of the visit.  We reviewed the resident's history and exam and pertinent patient test results.  I agree with the assessment, diagnosis, and plan of care documented in the resident's note.

## 2024-10-30 ENCOUNTER — Telehealth: Payer: Self-pay | Admitting: *Deleted

## 2024-10-30 NOTE — Telephone Encounter (Unsigned)
 Copied from CRM [id number]. Topic: Clinical - Order For Equipment >> Oct 30, 2024  2:02 PM Suzette B wrote: Reason for CRM: Received call from Lauraine at [facility], whom stated that  Dr. Jolaine had sent over a request for this patient to have repairs done to her wheelchair. Representative stated that she had sent a fax over in regard to the order for patient on 9th and then the 11th with no response. I've searched for the information and was unable to locate it. Lauraine stated she was refaxing over. Please call Lauraine to advise at [phone]

## 2024-10-30 NOTE — Telephone Encounter (Addendum)
 Phone rep called pt to relay message, after checking DPR the message from RN was relayed to Tanisha Perez(on DPR)she said she will have pt to call.

## 2024-10-30 NOTE — Telephone Encounter (Signed)
 Pt returned call and was informed of the nurses message. Pt stated she will speak to her PCP to get another referral.

## 2024-10-30 NOTE — Telephone Encounter (Signed)
 Noted

## 2024-10-31 ENCOUNTER — Telehealth: Payer: Self-pay | Admitting: *Deleted

## 2024-10-31 ENCOUNTER — Ambulatory Visit
Admission: RE | Admit: 2024-10-31 | Discharge: 2024-10-31 | Disposition: A | Payer: MEDICAID | Source: Ambulatory Visit | Attending: Internal Medicine

## 2024-10-31 DIAGNOSIS — Z1231 Encounter for screening mammogram for malignant neoplasm of breast: Secondary | ICD-10-CM

## 2024-10-31 NOTE — Telephone Encounter (Signed)
 Copied from CRM #[id number]. Topic: Referral - Question >> Oct 31, 2024  9:24 AM Diannia H wrote: Reason for CRM: Shelly the nurse called and wanted to know if the provider can send a referral to Neuro about the patient being seen. They are trying to determine if the patient is able to live alone. She has a referral in the system that was done back in April but Rico stated they would not see her for what she needs because of the wording from the referral. Could you assist? Callback number for Rico is [phone].   When sending the referral make sure it states its for cognitive testing to determine if the patient can live by her self.

## 2024-11-02 ENCOUNTER — Telehealth: Payer: Self-pay | Admitting: Student

## 2024-11-02 NOTE — Telephone Encounter (Signed)
 Message has been forwarded to the triage nurse and Dr. Marylu.  Copied from CRM #[id number]. Topic: General - Other >> Nov 01, 2024 11:46 AM DeAngela L wrote: Reason for CRM: Patient would like for Dr Marylu office to get in contact with  Rico the nurse is Jarrell  And the patient states she needs her paperwork completed so she can have the home health hours back and the patient is upset that she can't get out of bed and there is no one there to help patient has not had a nurse come out since 09/12/24 and she is frustrated   Pt num [phone] (H) ok to leave a detailed message

## 2024-11-02 NOTE — Telephone Encounter (Signed)
 Patient called to check status of request for referral. Would like a call back with update. Thank You

## 2024-11-07 ENCOUNTER — Telehealth: Payer: Self-pay | Admitting: *Deleted

## 2024-11-07 NOTE — Telephone Encounter (Signed)
 Forwarding message to Lela about Personal care service hours.

## 2024-11-07 NOTE — Telephone Encounter (Signed)
 Retuned call to the patient regarding wanting more hour for PCS/ LVM  for the patient that I was returning her call, she can contact our office at [phone], our office will be closing today at 12:00 noon and will be closed Thursday and Friday for the holiday.

## 2024-11-12 ENCOUNTER — Ambulatory Visit: Payer: MEDICAID | Admitting: Student

## 2024-11-12 VITALS — BP 140/88 | HR 84 | Temp 97.5°F | Ht 69.0 in

## 2024-11-12 DIAGNOSIS — I69398 Other sequelae of cerebral infarction: Secondary | ICD-10-CM

## 2024-11-12 DIAGNOSIS — Z833 Family history of diabetes mellitus: Secondary | ICD-10-CM

## 2024-11-12 DIAGNOSIS — I4891 Unspecified atrial fibrillation: Secondary | ICD-10-CM

## 2024-11-12 DIAGNOSIS — Z87820 Personal history of traumatic brain injury: Secondary | ICD-10-CM

## 2024-11-12 DIAGNOSIS — Z794 Long term (current) use of insulin: Secondary | ICD-10-CM

## 2024-11-12 DIAGNOSIS — E1165 Type 2 diabetes mellitus with hyperglycemia: Secondary | ICD-10-CM | POA: Diagnosis not present

## 2024-11-12 DIAGNOSIS — E559 Vitamin D deficiency, unspecified: Secondary | ICD-10-CM

## 2024-11-12 DIAGNOSIS — E785 Hyperlipidemia, unspecified: Secondary | ICD-10-CM

## 2024-11-12 DIAGNOSIS — I6200 Nontraumatic subdural hemorrhage, unspecified: Secondary | ICD-10-CM

## 2024-11-12 DIAGNOSIS — E1140 Type 2 diabetes mellitus with diabetic neuropathy, unspecified: Secondary | ICD-10-CM

## 2024-11-12 DIAGNOSIS — Z79899 Other long term (current) drug therapy: Secondary | ICD-10-CM

## 2024-11-12 DIAGNOSIS — E538 Deficiency of other specified B group vitamins: Secondary | ICD-10-CM

## 2024-11-12 DIAGNOSIS — Z7901 Long term (current) use of anticoagulants: Secondary | ICD-10-CM

## 2024-11-12 DIAGNOSIS — S069X9D Unspecified intracranial injury with loss of consciousness of unspecified duration, subsequent encounter: Secondary | ICD-10-CM

## 2024-11-12 DIAGNOSIS — G40909 Epilepsy, unspecified, not intractable, without status epilepticus: Secondary | ICD-10-CM

## 2024-11-12 DIAGNOSIS — Z8249 Family history of ischemic heart disease and other diseases of the circulatory system: Secondary | ICD-10-CM

## 2024-11-12 LAB — POCT GLYCOSYLATED HEMOGLOBIN (HGB A1C): HbA1c, POC (controlled diabetic range): 11.8 % — AB (ref 0.0–7.0)

## 2024-11-12 LAB — GLUCOSE, CAPILLARY: Glucose-Capillary: 166 mg/dL — ABNORMAL HIGH (ref 70–99)

## 2024-11-12 MED ORDER — NOVOLOG FLEXPEN 100 UNIT/ML ~~LOC~~ SOPN: 8.0000 [IU] | PEN_INJECTOR | Freq: Three times a day (TID) | SUBCUTANEOUS | 11 refills | Status: DC

## 2024-11-12 MED ORDER — TRULICITY 0.75 MG/0.5ML ~~LOC~~ SOAJ: 0.7500 mg | SUBCUTANEOUS | 3 refills | Status: AC

## 2024-11-12 MED ORDER — APIXABAN 5 MG PO TABS: 5.0000 mg | ORAL_TABLET | Freq: Two times a day (BID) | ORAL | 5 refills | Status: AC

## 2024-11-12 NOTE — Patient Instructions (Signed)
 Novolog  8 units with meals Be nice and hydrated next week for urine

## 2024-11-12 NOTE — Progress Notes (Unsigned)
 CC: Follow-up  HPI:  [name] is a 58 y.o. female living with a history stated below and presents today for follow-up. Please see problem based assessment and plan for additional details.  Past Medical History:  Diagnosis Date   Acute cystitis 06/19/2021   AKI (acute kidney injury) 09/13/2022   AKI (acute kidney injury) 09/13/2022   Allergic rhinitis    Arthritis    hips, back, legs, arms (07/04/2014)   Asthma    hx   Automatic implantable cardioverter-defibrillator in situ    Calcifying tendinitis of shoulder    Chronic combined systolic and diastolic CHF (congestive heart failure) (HCC)    EF 40-45% by echo 12/06/2012   Chronic diastolic heart failure (HCC)     Primarily diastolic CHF: Likely due to uncontrolled HTN. Last echo (8/12) with EF 45-50%, mild to moderate LVH with some asymmetric septal hypertrophy, RV normal size and systolic function. EF 50-55% by LV-gram in 6/12.    Chronic lower back pain    secondary to DJD, obsetiy, hip problems. Followed by Dr. Darlyn Gunther (pain management)   Chronic systolic heart failure (HCC) 05/17/2022   Chronic use of opiate for therapeutic purpose 12/14/2016   Contact with and (suspected) exposure to covid-19 02/07/2020   Coronary artery disease    questionable. LHC 05/2011 showing normal coronaries // Followed at [facility], Dr. Rolan   Cough 11/02/2022   Degeneration of lumbar or lumbosacral intervertebral disc    Diabetic foot infection (HCC) 03/05/2023   DJD (degenerative joint disease) of hip    right sided   Fatigue 04/27/2022   Frequent UTI    GERD (gastroesophageal reflux disease)    HLD (hyperlipidemia)    Hypertension    Poorly controlled. Has had HTN since age 54. Angioedema with ACEI.  24 Hr urine and renal arterial dopplers ordered . . . 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   Presence of permanent cardiac pacemaker    Rash 07/14/2016   Seizures (HCC)    last 3 months   Shortness of breath    none now   Stroke [facility]) 12/2013   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 36 g 0   apixaban  (ELIQUIS ) 5 MG TABS tablet Take 1 tablet (5 mg total) by mouth 2 (two) times daily. 120 tablet  5   Blood Glucose Monitoring Suppl (ACCU-CHEK GUIDE) w/Device KIT Use As Directed 1 kit 0   Continuous Glucose Sensor (FREESTYLE LIBRE 3 PLUS SENSOR) MISC Change sensor every 15 days. 6 each 3   cyanocobalamin  1000 MCG tablet Take 1 tablet by mouth daily.     cyclobenzaprine  (FLEXERIL ) 10 MG tablet Take 1 tablet (10 mg total) by mouth 2 (two) times daily as needed for muscle spasms. 60 tablet 1   diclofenac  Sodium (VOLTAREN ) 1 % GEL Apply 4 g topically 4 (four) times daily as needed (Pain). Left knee 100 g 1   escitalopram  (LEXAPRO ) 10 MG tablet Take 1 tablet (10 mg total) by mouth daily. 90 tablet 3   famotidine  (PEPCID )  20 MG tablet Take 1 tablet (20 mg total) by mouth 2 (two) times daily. 60 tablet 3   feeding supplement (ENSURE ENLIVE / ENSURE PLUS) LIQD Take 237 mLs by mouth 2 (two) times daily between meals.     gabapentin  (NEURONTIN ) 300 MG capsule Take 1 capsule (300 mg total) by mouth 3 (three) times daily. 180 capsule 5   glucose blood (ACCU-CHEK GUIDE TEST) test strip Use as instructed 100 each 12   GNP ULTICARE PEN NEEDLES 31G X 8 MM MISC Inject 1 each into the skin 4 (four) times daily. 200 each 0   Insulin  Glargine (BASAGLAR  KWIKPEN) 100 UNIT/ML Inject 56 Units into the skin daily. 15 mL 11   isosorbide -hydrALAZINE  (BIDIL ) 20-37.5 MG tablet Take 1 tablet by mouth 3 (three) times daily. 90 tablet 11   Lacosamide  (VIMPAT ) 100 MG TABS Take 2 tablets (200 mg total) by mouth 2 (two) times daily. 120 tablet 11   lacosamide  (VIMPAT ) 200 MG TABS tablet Take 1 tablet (200 mg total) by mouth 2 (two) times daily. 180 tablet 3   levETIRAcetam  (KEPPRA ) 500 MG tablet Take 1 tablet (500 mg total) by mouth 2 (two) times daily. 180 tablet 3   linaclotide  (LINZESS ) 290 MCG CAPS capsule Take 1 capsule (290 mcg total) by mouth daily before breakfast. 30 capsule 3   melatonin 3 MG TABS tablet Take 3 mg by mouth at bedtime.     metoprolol  succinate (TOPROL  XL) 100 MG 24 hr tablet Take 1 tablet (100 mg total) by mouth daily. Take with or immediately following a meal. 30 tablet 2   nutrition supplement, JUVEN, (JUVEN) PACK Take 1 packet by mouth 2 (two) times daily between meals.     Nystatin  (GERHARDT'S BUTT CREAM) CREA Apply 1 Application topically 2 (two) times daily.     pantoprazole  (PROTONIX ) 40 MG tablet Take 1 tablet (40 mg total) by mouth daily. 90 tablet 3   polyethylene glycol (MIRALAX  / GLYCOLAX ) 17 g packet Take 17 g by mouth daily as needed.     PREMARIN  vaginal cream Place 0.5 (1gram) Applicatorfuls vaginally 3 (three) times a week. ** This is 70 DAYS supply ** 30 g 2   psyllium (HYDROCIL/METAMUCIL) 95 % PACK  Take 1 packet by mouth daily.     rosuvastatin  (CRESTOR ) 40 MG tablet Take 1 tablet (40 mg total) by mouth daily. 90 tablet 3   senna (SENOKOT) 8.6 MG TABS tablet Take 1 tablet (8.6 mg total) by mouth 2 (two) times daily as needed for moderate constipation.     spironolactone  (ALDACTONE ) 25 MG tablet Take 1 tablet (25 mg total) by mouth daily. 90 tablet 3   talc  powder Apply topically as needed. 240 g 0   Vitamin D , Ergocalciferol , (DRISDOL ) 1.25 MG (  50000 UNIT) CAPS capsule TAKE ONE CAPSULE BY MOUTH EVERY 7 DAYS 5 capsule 5   Zinc  Sulfate 220 (50 Zn) MG TABS Take 1 tablet (220 mg total) by mouth daily. 100 tablet 0   No current facility-administered medications on file prior to visit.    Family History  Problem Relation Age of Onset   Heart disease Mother 4       Died of MI at age 53 yo   Kidney disease Mother        requiring dialysis   Congestive Heart Failure Mother    Heart disease Father 65       MI age 79yo requiring stenting   Diabetes Father    Glaucoma Father    Heart disease Paternal Grandmother        requiring pacemaker.   Heart disease Paternal Grandfather 49       Died of MI at possibly age 7-53yo   Stroke Paternal Grandfather    Diabetes Brother    Heart disease Brother 46       MI at age 110 years old   Breast cancer Paternal Aunt    Breast cancer Maternal Grandmother     Social History   Socioeconomic History   Marital status: Single    Spouse name: Not on file   Number of children: 2   Years of education: 9th grade   Highest education level: Not on file  Occupational History   Occupation: Unemployed    Comment: planning on getting disability  Tobacco Use   Smoking status: Never    Passive exposure: Current   Smokeless tobacco: Never  Vaping Use   Vaping status: Never Used  Substance and Sexual Activity   Alcohol  use: No    Alcohol /week: 0.0 standard drinks of alcohol    Drug use: No   Sexual activity: Yes    Partners: Male    Birth  control/protection: Surgical, Post-menopausal    Comment: menarche 58yo, sexual debut 58yo  Other Topics Concern   Not on file  Social History Narrative   Lives in [place] with her son. Is able to read and write fluently in English.   Social Drivers of Health   Financial Resource Strain: Medium Risk (03/23/2023)   Overall Financial Resource Strain (CARDIA)    Difficulty of Paying Living Expenses: Somewhat hard  Food Insecurity: No Food Insecurity (03/02/2024)   Hunger Vital Sign    Worried About Running Out of Food in the Last Year: Never true    Ran Out of Food in the Last Year: Never true  Transportation Needs: Unmet Transportation Needs (03/02/2024)   PRAPARE - Administrator, Civil Service (Medical): Yes    Lack of Transportation (Non-Medical): Yes  Physical Activity: Insufficiently Active (03/23/2023)   Exercise Vital Sign    Days of Exercise per Week: 3 days    Minutes of Exercise per Session: 20 min  Stress: Not on file  Social Connections: Moderately Isolated (03/02/2024)   Social Connection and Isolation Panel    Frequency of Communication with Friends and Family: More than three times a week    Frequency of Social Gatherings with Friends and Family: Three times a week    Attends Religious Services: More than 4 times per year    Active Member of Clubs or Organizations: No    Attends Banker Meetings: Never    Marital Status: Never married  Intimate Partner Violence: Not At Risk (03/02/2024)   Humiliation, Afraid, Rape,  and Kick questionnaire    Fear of Current or Ex-Partner: No    Emotionally Abused: No    Physically Abused: No    Sexually Abused: No    Review of Systems: ROS negative except for what is noted on the assessment and plan.  Vitals:   11/12/24 1523 11/12/24 1537  BP: (!) 159/94 (!) 140/88  Pulse: 84 84  Temp: (!) 97.5 F (36.4 C)   TempSrc: Oral   SpO2: 93%   Height: 5' 9 (1.753 m)     Physical Exam: Constitutional:  well-appearing, sitting in chair, in no acute distress Cardiovascular: regular rate and rhythm, no m/r/g Pulmonary/Chest: normal work of breathing on room air, lungs clear to auscultation bilaterally Abdominal: soft, non-tender, non-distended MSK: normal bulk and tone Skin: warm and dry Psych: normal mood and behavior  Assessment & Plan:   Type 2 diabetes mellitus with hyperglycemia, with long-term current use of insulin  (HCC) - Primary     Patient presents with a history of T2DM with a prior A1c of 9.0 in August 2025. They are on a regimen of Lantus  48 daily.  Patient denies hypoglycemia.  Per review of CGM, average blood glucose is 274 without hypoglycemia Plan: -Increase Lantus  to 56 units daily, start ozempic  0.25 mg weekly injection (patient was on Trulicity  in the past, if ozempic  is not covered by insurance I would recommend starting Trulicity )  -Ophthalmology referral already sent at prior visit  -Urine ACR today  hronic combined systolic and diastolic heart failure (HCC) TTE in 2023 with EF of 25-30%.  Lopressor  75 BID exchanged for Toprol -XL 100 mg daily last week and also takes Aldactone  25 mg daily.  No SGLT2-I per above.  No Entresto given history of angioedema though cardiology had considered trial of losartan. No issues since switching to Toprol -XL. She unfortunately did not record ambulatory readings but was reminded to do so today. Will bring at-home BP's and HR's to follow-up. Plan Toprol -XL accordingly. She will follow-up with cardiology in addition to above management.   ASSESSMENT/PLAN: 1. Chronic systolic HF: Nonischemic cardiomyopathy, normal coronaries on 6/12 angiography.  Cardiomyopathy may be due to long-standing and poorly-controlled HTN.  Echo 11/16 EF 25-30%, echo 2/18 with EF improved to 45%.  S/P CRT-D [employer].  Echo 8/22 showed EF back down to 25% with mildly decreased RV systolic function.  Unfortunately lost to follow up. Echo 10/23 showed EF 25-30%,  moderate LVH, grade I DD, RV mildly reduced. Today, she is mildly volume overloaded by exam and OptiVol (improved from last visit). NYHA class III symptoms, confounded by hemiparesis and toe amputation.  - Restart Lasix  40 mg daily and KCL 20 daily.   - Continue Toprol  XL 50 mg daily.  - Continue spironolactone  100 mg daily.  - Continue Imdur  90 mg daily + increase hydralazine  25>50 mg BID (refuses to take TID). - Off Farxiga  with yeast infections. Consider re-trial when A1C improved. Recent yeast infection, will hold off today. - Plan to add valsartan next visit (no ACEi or Entresto with angioedema) - Repeat echo after GDMT titrated and diuresed. - BMET/BNP today. Will need labs at f/u.  2. HTN: BP elevated. Diurese as above. (Has not had meds this morning) - Can stop amlodipine  in the future to allow uptitration of GDMT.  3. Morbid obesity:  Body mass index is 35.8 kg/m. 4. OSA on CPAP:  Needs repeat sleep study for new CPAP, will order 5. Atrial fibrillation: PAF. Patient had a CVA in the past.  CHADSVASC = 5.  - Continue Xarelto .  CBC today. 6. CVA: Prior CVA with left leg weakness.  7. 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 Harden 8. DM2: Recent A1C 9.1 - She is on insulin . - Consider re-trail of SGLT2i if/when A1C improves.      Patient {GC/[id number]::discussed with,seen with} Dr. {[id number]::Tpoopjfd,Z. Hoffman,Mullen,Narendra,Vincent,Guilloud,Lau,Machen}  No problem-specific Assessment & Plan notes found for this encounter.   Norman Lobstein, D.O. [facility] Internal Medicine, PGY-2 Phone: [phone] Date 11/12/2024 Time 3:52 PM

## 2024-11-13 ENCOUNTER — Telehealth: Payer: Self-pay

## 2024-11-13 LAB — VITAMIN B12: Vitamin B-12: 872 pg/mL (ref 232–1245)

## 2024-11-13 LAB — VITAMIN D 25 HYDROXY (VIT D DEFICIENCY, FRACTURES): Vit D, 25-Hydroxy: 23.2 ng/mL — ABNORMAL LOW (ref 30.0–100.0)

## 2024-11-13 LAB — LIPID PANEL
Chol/HDL Ratio: 2.4 ratio (ref 0.0–4.4)
Cholesterol, Total: 120 mg/dL (ref 100–199)
HDL: 50 mg/dL (ref >39)
LDL Chol Calc (NIH): 41 mg/dL (ref 0–99)
Triglycerides: 179 mg/dL — ABNORMAL HIGH (ref 0–149)
VLDL Cholesterol Cal: 29 mg/dL (ref 5–40)

## 2024-11-13 NOTE — Telephone Encounter (Signed)
 [name] ([name]) PA Case ID #: [id number] Rx #: [id number] Need Help? Call us  at [phone] Outcome Approved today by [employer] 2017 Approved. TRULICITY  0.75MG /0.5ML Soln Auto-inj is approved from 11/13/2024 to 11/13/2025. All strengths of the drug are approved. Effective Date: 11/13/2024 Authorization Expiration Date: 11/13/2025 Drug Trulicity  0.75MG /0.5ML auto-injectors ePA cloud logo Form [employer] Electronic Prior Authorization Form Original Claim Info 75 . Help Desk [phone]. For 3 day temporary supply, use Level of Service 03.

## 2024-11-13 NOTE — Assessment & Plan Note (Addendum)
 A1c today increased 9.0 > 11.8.  She is surprised by increase as she states there have been no missed doses of medication and diet is unchanged.  Currently managed with glargine 56 units daily.  Denies episodes of hypoglycemia.  Highest number she has seen is in the mid 300s.  I will add NovoLog  8 units 3 times daily with meals and start Trulicity  today.  She will follow up in 1 week and bring CGM data and give microalbumin: creatinine. Ophthalmology and podiatry referral.

## 2024-11-13 NOTE — Telephone Encounter (Signed)
 Prior Authorization for patient (NovoLOG  FlexPen 100UNIT/ML pen-injectors) came through on cover my meds was submitted with last office notes and labs awaiting approval or denial.  KEY:[id number]

## 2024-11-13 NOTE — Assessment & Plan Note (Signed)
 07/2024 LDL was 103.  Lipitor 40 exchanged for Crestor  40 with LDL goal less than 50.  Recheck lipid panel today.

## 2024-11-13 NOTE — Assessment & Plan Note (Signed)
 Takes daily B12.  Recheck level today.

## 2024-11-13 NOTE — Telephone Encounter (Signed)
 Prior Authorization for patient (Trulicity  0.75MG /0.5ML auto-injectors) came through on cover my meds was submitted with last office notes and labs awaiting approval or denial.  KEY:[id number]

## 2024-11-13 NOTE — Assessment & Plan Note (Signed)
 NSR today.  Denies palpitations/SOB.  Continue Toprol -XL 100 and Eliquis . I have provided contact information to re-establish with heart care as she previously followed with him, most recently in 2024.

## 2024-11-13 NOTE — Assessment & Plan Note (Signed)
 Takes vitamin D  daily.  Recheck level today.

## 2024-11-13 NOTE — Assessment & Plan Note (Signed)
 Referral to neurology for neuropsychology/cognitive testing to determine if it is safe for her to live alone.

## 2024-11-14 ENCOUNTER — Other Ambulatory Visit: Payer: Self-pay | Admitting: Student

## 2024-11-14 MED ORDER — INSULIN ASPART 100 UNIT/ML FLEXPEN: 8.0000 [IU] | PEN_INJECTOR | Freq: Three times a day (TID) | SUBCUTANEOUS | 30 refills | Status: AC

## 2024-11-14 NOTE — Progress Notes (Signed)
 Internal Medicine Clinic Attending  Case discussed with the resident at the time of the visit.  We reviewed the resident's history and exam and pertinent patient test results.  I agree with the assessment, diagnosis, and plan of care documented in the resident's note.

## 2024-11-14 NOTE — Telephone Encounter (Signed)
 YOU ASKED FOR: Service Description Code 1 Code 2 Plan Requested  Dates Requested  Amount NOVOLOG  FLEXPEN 100UNIT/ML Soln  Pen-inj [id number] Medicaid 11/13/2024 to  11/13/2025 75 WE DENIED: Service Description Code 1 Code 2 Plan Denied Dates Denied  Amount NOVOLOG  FLEXPEN 100UNIT/ML Soln  Pen-inj [id number] Medicaid 11/13/2024 to  11/13/2025 75 COMMENTS:  If you are not satisfied with this determination, and no "peer-to-peer conversation" occurred, your provider  may request one. (A peer-to-peer conversation is a telephone discussion between your provider and the  clinical peer reviewer.) [facility] provides, within one business day of a request by the ordering provider, the  opportunity to discuss the non-certification decision with the clinical peer reviewer who made the initial  determination. If the original clinical peer reviewer cannot be available within one business day, a different  clinical peer reviewer will be made available. If the treating physician would like to discuss the case with the  physician reviewer, please call [phone]. We denied your request for:  [id number] NOVOLOG  FLEXPEN 100UNIT/ML Soln Pen-inj Policy rules found on the [place]  Medicaid Preferred Drug List (PDL) guided our decision. Here are the policy requirements your request did not meet: The request does not meet the [place]  Medicaid Preferred Drug List (PDL) trial and failure criteria for  the use of a non-preferred drug. To meet the criteria for approval, you must first try one of the following  preferred drugs on the [place] Preferred Drug List (PDL) (this is a list of covered drugs):  insulin  aspart U-100 Penfill/ FlexPen / vial (generic for Novolog ), insulin  lispro U-100 Junior KwikPen  (generic for Humalog Junior), insulin  lispro U-100 KwikPen / vial (generic for Humalog), Relion Novolog  U-100  FlexPen / Vial Our pharmacy records and information sent in by your  doctor do not show that these drugs were tried and  whether or not they helped your condition. If you have already tried these drugs and they caused side  effects, did not work, or there is a reason that you cannot take these drugs, please ask your doctor to send us   this information.

## 2024-11-15 ENCOUNTER — Ambulatory Visit: Payer: Self-pay | Admitting: Student

## 2024-11-15 ENCOUNTER — Other Ambulatory Visit: Payer: Self-pay | Admitting: Student

## 2024-11-15 MED ORDER — VITAMIN D (ERGOCALCIFEROL) 1.25 MG (50000 UNIT) PO CAPS: 50000.0000 [IU] | ORAL_CAPSULE | ORAL | 0 refills | Status: DC

## 2024-11-19 ENCOUNTER — Telehealth: Payer: Self-pay | Admitting: *Deleted

## 2024-11-19 NOTE — Telephone Encounter (Unsigned)
 Copied from CRM [id number]. Topic: Clinical - Order For Equipment >> Nov 19, 2024  2:11 PM Brittney F wrote: Reason for CRM:   Caller: Sarah  Calling From: [employer]  Calling to confirm that the [employer] Medical Necessity that she sent over multiple times beginning on 09/18/2024 until just this month was received and filled out. The patient is in need of this documentation for her powered wheelchair to be fixed and covered by her insurance. Due to paperwork turnaround time being exceeded specialist escalated call to CAL for further review.   Please fill out area 19-30, sign, date and return using information listed.   Please utilize the information listed below and contact Sarah with update as she has received calls from the patient who is in need of the powered wheelchair.   Contact Information:   P: [phone] F: [phone]

## 2024-11-20 ENCOUNTER — Other Ambulatory Visit: Payer: Self-pay | Admitting: Student

## 2024-11-20 ENCOUNTER — Ambulatory Visit: Payer: MEDICAID | Admitting: Student

## 2024-11-20 VITALS — BP 159/98 | HR 102 | Temp 97.6°F | Ht 69.0 in

## 2024-11-20 DIAGNOSIS — E1140 Type 2 diabetes mellitus with diabetic neuropathy, unspecified: Secondary | ICD-10-CM

## 2024-11-20 DIAGNOSIS — Z0000 Encounter for general adult medical examination without abnormal findings: Secondary | ICD-10-CM

## 2024-11-20 NOTE — Patient Instructions (Addendum)
 [facility] on 03/11/2025 at 1 pm   Take your Vitamin D  50,000 units once/week for 8 weeks

## 2024-11-20 NOTE — Progress Notes (Unsigned)
 CC: Follow-up  HPI:  [name] is a 58 y.o. female living with a history stated below and presents today for follow-up. Please see problem based assessment and plan for additional details.  Past Medical History:  Diagnosis Date   Acute cystitis 06/19/2021   AKI (acute kidney injury) 09/13/2022   AKI (acute kidney injury) 09/13/2022   Allergic rhinitis    Arthritis    hips, back, legs, arms (07/04/2014)   Asthma    hx   Automatic implantable cardioverter-defibrillator in situ    Calcifying tendinitis of shoulder    Chronic combined systolic and diastolic CHF (congestive heart failure) (HCC)    EF 40-45% by echo 12/06/2012   Chronic diastolic heart failure (HCC)     Primarily diastolic CHF: Likely due to uncontrolled HTN. Last echo (8/12) with EF 45-50%, mild to moderate LVH with some asymmetric septal hypertrophy, RV normal size and systolic function. EF 50-55% by LV-gram in 6/12.    Chronic lower back pain    secondary to DJD, obsetiy, hip problems. Followed by Dr. Darlyn Gunther (pain management)   Chronic systolic heart failure (HCC) 05/17/2022   Chronic use of opiate for therapeutic purpose 12/14/2016   Contact with and (suspected) exposure to covid-19 02/07/2020   Coronary artery disease    questionable. LHC 05/2011 showing normal coronaries // Followed at [facility], Dr. Rolan   Cough 11/02/2022   Degeneration of lumbar or lumbosacral intervertebral disc    Diabetic foot infection (HCC) 03/05/2023   DJD (degenerative joint disease) of hip    right sided   Fatigue 04/27/2022   Frequent UTI    GERD (gastroesophageal reflux disease)    HLD (hyperlipidemia)    Hypertension    Poorly controlled. Has had HTN since age 77. Angioedema with ACEI.  24 Hr urine and renal arterial dopplers ordered . . . 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   Presence of permanent cardiac pacemaker    Rash 07/14/2016   Seizures (HCC)    last 3 months   Shortness of breath    none now   Stroke [facility]) 12/2013   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 36 g 0   apixaban  (ELIQUIS ) 5 MG TABS tablet Take 1 tablet (5 mg total) by mouth 2 (two) times daily. 120 tablet  5   Blood Glucose Monitoring Suppl (ACCU-CHEK GUIDE) w/Device KIT Use As Directed 1 kit 0   Continuous Glucose Sensor (FREESTYLE LIBRE 3 PLUS SENSOR) MISC Change sensor every 15 days. 6 each 3   cyanocobalamin  1000 MCG tablet Take 1 tablet by mouth daily.     cyclobenzaprine  (FLEXERIL ) 10 MG tablet Take 1 tablet (10 mg total) by mouth 2 (two) times daily as needed for muscle spasms. 60 tablet 1   diclofenac  Sodium (VOLTAREN ) 1 % GEL Apply 4 g topically 4 (four) times daily as needed (Pain). Left knee 100 g 1   Dulaglutide  (TRULICITY ) 0.75 MG/0.5ML SOAJ Inject 0.75 mg into the skin once a week. 2 mL 3   escitalopram   (LEXAPRO ) 10 MG tablet Take 1 tablet (10 mg total) by mouth daily. 90 tablet 3   famotidine  (PEPCID ) 20 MG tablet Take 1 tablet (20 mg total) by mouth 2 (two) times daily. 60 tablet 3   feeding supplement (ENSURE ENLIVE / ENSURE PLUS) LIQD Take 237 mLs by mouth 2 (two) times daily between meals.     gabapentin  (NEURONTIN ) 300 MG capsule Take 1 capsule (300 mg total) by mouth 3 (three) times daily. 180 capsule 5   glucose blood (ACCU-CHEK GUIDE TEST) test strip Use as instructed 100 each 12   GNP ULTICARE PEN NEEDLES 31G X 8 MM MISC Inject 1 each into the skin 4 (four) times daily. 200 each 0   insulin  aspart (NOVOLOG ) 100 UNIT/ML FlexPen Inject 8 Units into the skin 3 (three) times daily with meals. 3 mL 30   Insulin  Glargine (BASAGLAR  KWIKPEN) 100 UNIT/ML Inject 56 Units into the skin daily. 15 mL 11   isosorbide -hydrALAZINE  (BIDIL ) 20-37.5 MG tablet Take 1 tablet by mouth 3 (three) times daily. 90 tablet 11   Lacosamide  (VIMPAT ) 100 MG TABS Take 2 tablets (200 mg total) by mouth 2 (two) times daily. 120 tablet 11   lacosamide  (VIMPAT ) 200 MG TABS tablet Take 1 tablet (200 mg total) by mouth 2 (two) times daily. 180 tablet 3   levETIRAcetam  (KEPPRA ) 500 MG tablet Take 1 tablet (500 mg total) by mouth 2 (two) times daily. 180 tablet 3   linaclotide  (LINZESS ) 290 MCG CAPS capsule Take 1 capsule (290 mcg total) by mouth daily before breakfast. 30 capsule 3   melatonin 3 MG TABS tablet Take 3 mg by mouth at bedtime.     nutrition supplement, JUVEN, (JUVEN) PACK Take 1 packet by mouth 2 (two) times daily between meals.     Nystatin  (GERHARDT'S BUTT CREAM) CREA Apply 1 Application topically 2 (two) times daily.     pantoprazole  (PROTONIX ) 40 MG tablet Take 1 tablet (40 mg total) by mouth daily. 90 tablet 3   polyethylene glycol (MIRALAX  / GLYCOLAX ) 17 g packet Take 17 g by mouth daily as needed.     PREMARIN  vaginal cream Place 0.5 (1gram) Applicatorfuls vaginally 3 (three) times a week. ** This is 70  DAYS supply ** 30 g 2   psyllium (HYDROCIL/METAMUCIL) 95 % PACK Take 1 packet by mouth daily.     rosuvastatin  (CRESTOR ) 40 MG tablet Take 1 tablet (40 mg total) by mouth daily. 90 tablet 3   senna (SENOKOT) 8.6 MG TABS tablet Take 1 tablet (8.6 mg total) by mouth 2 (two) times daily as needed for moderate constipation.     spironolactone  (ALDACTONE ) 25 MG tablet Take 1 tablet (25 mg total) by mouth daily. 90 tablet 3   talc  powder Apply topically as  needed. 240 g 0   Vitamin D , Ergocalciferol , (DRISDOL ) 1.25 MG (50000 UNIT) CAPS capsule Take 1 capsule (50,000 Units total) by mouth every 7 (seven) days. 8 capsule 0   Zinc  Sulfate 220 (50 Zn) MG TABS Take 1 tablet (220 mg total) by mouth daily. 100 tablet 0   No current facility-administered medications on file prior to visit.    Family History  Problem Relation Age of Onset   Heart disease Mother 48       Died of MI at age 67 yo   Kidney disease Mother        requiring dialysis   Congestive Heart Failure Mother    Heart disease Father 64       MI age 54yo requiring stenting   Diabetes Father    Glaucoma Father    Heart disease Paternal Grandmother        requiring pacemaker.   Heart disease Paternal Grandfather 69       Died of MI at possibly age 57-53yo   Stroke Paternal Grandfather    Diabetes Brother    Heart disease Brother 69       MI at age 34 years old   Breast cancer Paternal Aunt    Breast cancer Maternal Grandmother     Social History   Socioeconomic History   Marital status: Single    Spouse name: Not on file   Number of children: 2   Years of education: 9th grade   Highest education level: Not on file  Occupational History   Occupation: Unemployed    Comment: planning on getting disability  Tobacco Use   Smoking status: Never    Passive exposure: Current   Smokeless tobacco: Never  Vaping Use   Vaping status: Never Used  Substance and Sexual Activity   Alcohol  use: No    Alcohol /week: 0.0 standard  drinks of alcohol    Drug use: No   Sexual activity: Yes    Partners: Male    Birth control/protection: Surgical, Post-menopausal    Comment: menarche 58yo, sexual debut 58yo  Other Topics Concern   Not on file  Social History Narrative   Lives in [place] with her son. Is able to read and write fluently in English.   Social Drivers of Health   Financial Resource Strain: Medium Risk (03/23/2023)   Overall Financial Resource Strain (CARDIA)    Difficulty of Paying Living Expenses: Somewhat hard  Food Insecurity: No Food Insecurity (03/02/2024)   Hunger Vital Sign    Worried About Running Out of Food in the Last Year: Never true    Ran Out of Food in the Last Year: Never true  Transportation Needs: Unmet Transportation Needs (03/02/2024)   PRAPARE - Administrator, Civil Service (Medical): Yes    Lack of Transportation (Non-Medical): Yes  Physical Activity: Insufficiently Active (03/23/2023)   Exercise Vital Sign    Days of Exercise per Week: 3 days    Minutes of Exercise per Session: 20 min  Stress: Not on file  Social Connections: Moderately Isolated (03/02/2024)   Social Connection and Isolation Panel    Frequency of Communication with Friends and Family: More than three times a week    Frequency of Social Gatherings with Friends and Family: Three times a week    Attends Religious Services: More than 4 times per year    Active Member of Clubs or Organizations: No    Attends Banker Meetings: Never    Marital  Status: Never married  Intimate Partner Violence: Not At Risk (03/02/2024)   Humiliation, Afraid, Rape, and Kick questionnaire    Fear of Current or Ex-Partner: No    Emotionally Abused: No    Physically Abused: No    Sexually Abused: No    Review of Systems: ROS negative except for what is noted on the assessment and plan.  Vitals:   11/20/24 1509 11/20/24 1536  BP: (!) 167/113 (!) 159/98  Pulse: (!) 102 (!) 102  Temp: 97.6 F (36.4 C)    TempSrc: Oral   SpO2: 95%   Height: 5' 9 (1.753 m)     Physical Exam: Constitutional: well-appearing, sitting in chair, in no acute distress Cardiovascular: regular rate and rhythm, no m/r/g Pulmonary/Chest: normal work of breathing on room air, lungs clear to auscultation bilaterally Abdominal: soft, non-tender, non-distended MSK: normal bulk and tone Skin: warm and dry Psych: normal mood and behavior  Assessment & Plan:  Poorly controlled type 2 diabetes mellitus with neuropathy (HCC) A1c today increased 9.0 > 11.8.  [name] is surprised by increase as [name] states there have been no missed doses of medication and diet is unchanged.  Currently managed with glargine 56 units daily.  Denies episodes of hypoglycemia.  Highest number [name] has seen is in the mid 300s.  I will add NovoLog  8 units 3 times daily with meals and start Trulicity  today.  [name] will follow up in 1 week and bring CGM data and give microalbumin: creatinine. Ophthalmology and podiatry referral.    Atrial fibrillation (HCC) NSR today.  Denies palpitations/SOB.  Continue Toprol -XL 100 and Eliquis . I have provided contact information to re-establish with heart care as [name] previously followed with him, most recently in 2024.   Traumatic brain injury [facility]) Referral to neurology for neuropsychology/cognitive testing to determine if it is safe for her to live alone.   Vitamin B12 deficiency Takes daily B12.  Recheck level today.   Vitamin D  deficiency Takes vitamin D  daily.  Recheck level today.   Hyperlipidemia 07/2024 LDL was 103.  Lipitor 40 exchanged for Crestor  40 with LDL goal less than 50.  Recheck lipid panel today. Patient {GC/GE:[id number]::discussed with,seen with} Dr. {[id number]::Tpoopjfd,Z. Hoffman,Lau,Machen}  No problem-specific Assessment & Plan notes found for this encounter.   Norman Lobstein, D.O. [facility] Internal Medicine, PGY-2 Date 11/20/2024 Time 3:42 PM

## 2024-11-21 ENCOUNTER — Other Ambulatory Visit: Payer: Self-pay | Admitting: Dietician

## 2024-11-21 ENCOUNTER — Telehealth: Payer: Self-pay

## 2024-11-21 DIAGNOSIS — E1165 Type 2 diabetes mellitus with hyperglycemia: Secondary | ICD-10-CM

## 2024-11-21 LAB — MICROALBUMIN / CREATININE URINE RATIO
Creatinine, Urine: 133.1 mg/dL
Microalb/Creat Ratio: 87 mg/g{creat} — ABNORMAL HIGH (ref 0–29)
Microalbumin, Urine: 115.9 ug/mL

## 2024-11-21 MED ORDER — FREESTYLE LIBRE 3 READER DEVI: 1 refills | Status: AC

## 2024-11-21 NOTE — Assessment & Plan Note (Signed)
 A1c 12/2 increased 9.0 > 11.8. At that time Novolog  8 units TID was added. Since then at home readings have been better with the highest she has seen in low 200's with no episodes of hypoglycemia. Also managed with Glargine 56 units daily. Previously intolerant to Metformin  and SGLT2-I discontinued due to recurrent UTI's though I wonder if this was more related to her poorly controlled diabetes. Will attempt resumption once A1c improves.

## 2024-11-21 NOTE — Assessment & Plan Note (Signed)
 Low last week at 23.2. She is waiting on [facility] to send her 50,000 units weekly for 8 weeks.

## 2024-11-21 NOTE — Assessment & Plan Note (Signed)
 NSR today.  Denies palpitations/SOB.  Continue Toprol -XL 100 and Eliquis . She has been in contact with [facility] to re-[facility] care and is waiting for appointment time.

## 2024-11-21 NOTE — Assessment & Plan Note (Signed)
 TTE in 2023 with EF of 25-30%.  Managed with Toprol -XL 100 mg daily and Aldactone  25 mg daily.  No SGLT2-I per above.  No Entresto given history of angioedema. Denies orthopnea, SOB, leg swelling. Follow-up with Cardiology.

## 2024-11-21 NOTE — Telephone Encounter (Signed)
 [name] wanted to come in for help with her Continuous glucose monitoring. We also decided that it would be helpful for her to have a FL3 reader as a back up to her phone. Appointment scheduled for next Tuesday and request reader rx.

## 2024-11-21 NOTE — Assessment & Plan Note (Addendum)
 BP is 167/113 and 159/98.  Managed with BiDil  20-37.5 daily, Toprol -XL 100 mg daily, and Aldactone  25 mg daily (the latter two she is on for HFrEF 25-30%).  She states readings at home are consistently in 140s-150s.  Denies HA, visual changes, chest pain.  She will be seeing cardiology soon and given complicated comorbidities, will defer management for now but she is instructed to call by the end of the week if a specific appointment time is not established.

## 2024-11-21 NOTE — Telephone Encounter (Signed)
 Prior Authorization for patient [name] [facility] 3 Reader device) came through on cover my meds was submitted with last office notes and labs awaiting approval or denial.  XZB:[id number]

## 2024-11-22 ENCOUNTER — Ambulatory Visit: Payer: Self-pay | Admitting: Student

## 2024-11-22 NOTE — Telephone Encounter (Signed)
 Confirmed [facility] will have reader to patient before her appointment here next week.

## 2024-11-22 NOTE — Telephone Encounter (Signed)
 [name] (Key: [id number]) PA Case ID #: [id number] Rx #: [id number] Need Help? Call us  at [phone] Outcome Approved on December 10 by [employer] 2017 Approved. FREESTYLE LIBRE 3 READER Device is approved from 11/21/2024 to 05/22/2025. All strengths of the drug are approved. Effective Date: 11/21/2024 Authorization Expiration Date: 05/22/2025 Drug FreeStyle Libre 3 Reader device ePA cloud logo Form [employer] Electronic Prior Authorization Form Original Claim Info 75 . Help Desk [phone]. For 3 day temporary supply, use Level of Service 03.

## 2024-11-26 NOTE — Progress Notes (Signed)
 Internal Medicine Clinic Attending  Case discussed with the resident at the time of the visit.  We reviewed the resident's history and exam and pertinent patient test results.  I agree with the assessment, diagnosis, and plan of care documented in the resident's note.

## 2024-11-27 ENCOUNTER — Ambulatory Visit: Payer: Self-pay | Admitting: Dietician

## 2024-11-27 ENCOUNTER — Telehealth: Payer: MEDICAID

## 2024-11-27 ENCOUNTER — Ambulatory Visit: Payer: Medicaid Other | Admitting: Radiology

## 2024-11-27 NOTE — Progress Notes (Signed)
°  Video visit was initiated; however, the patient was unable to join the session. The patient was contacted by phone and reported difficulty understanding how to access the video visit. She requested an in-person appointment. Glenda was asked to contact the patient to assist with scheduling an in-person visit.

## 2024-11-27 NOTE — Patient Instructions (Signed)
 Patient is 58 year old lady with past medical history of hypertension on Aldactone  and Toprol , seizure disorder on Keppra , Vimpat , A-fib on Eliquis ,

## 2024-11-28 ENCOUNTER — Ambulatory Visit: Payer: Self-pay

## 2024-11-28 NOTE — Telephone Encounter (Signed)
 Call x1; lvmtcb at mobile/vm box full at home, to schedule patient for overdue 1 yr f/u (due 09/2023); ICD/MDT + to get reconnect monitor.

## 2024-11-28 NOTE — Patient Instructions (Incomplete)
 Ms. Ronnesha Mester is a 58 year old woman with poorly controlled insulin -dependent type 2 diabetes with neuropathy, HFrEF (EF 25-30%) due to nonischemic cardiomyopathy with ICD in place, atrial fibrillation on anticoagulation, longstanding hypertension, prior CVA with left-sided hemiparesis, seizure disorder, OSA on CPAP, and morbid obesity, presenting for follow-up of chronic medical conditions.  Last visit was for routine follow-up of her chronic medical conditions. At that time, her diabetes was noted to be poorly controlled with a rising A1c, prompting intensification of her insulin  regimen. Blood pressure remained above goal despite current therapy, though she denied symptoms of hypertensive urgency. From a cardiac standpoint, she was clinically stable without signs of volume overload and remained in normal sinus rhythm on anticoagulation. The visit focused on medication review, assessment of home glucose trends, and coordination of cardiology follow-up.

## 2024-12-10 ENCOUNTER — Ambulatory Visit (HOSPITAL_COMMUNITY): Payer: MEDICAID

## 2024-12-10 ENCOUNTER — Ambulatory Visit: Payer: Self-pay

## 2024-12-10 ENCOUNTER — Encounter (HOSPITAL_COMMUNITY): Payer: Self-pay

## 2024-12-10 ENCOUNTER — Ambulatory Visit (HOSPITAL_COMMUNITY)
Admission: EM | Admit: 2024-12-10 | Discharge: 2024-12-10 | Disposition: A | Discharge status: Home / Self Care | Payer: MEDICAID

## 2024-12-10 DIAGNOSIS — K649 Unspecified hemorrhoids: Secondary | ICD-10-CM | POA: Diagnosis not present

## 2024-12-10 DIAGNOSIS — K5901 Slow transit constipation: Secondary | ICD-10-CM

## 2024-12-10 MED ORDER — HYDROCORT-PRAMOXINE (PERIANAL) 1-1 % EX FOAM: 1.0000 | Freq: Two times a day (BID) | CUTANEOUS | 1 refills | Status: AC

## 2024-12-10 MED ORDER — POLYETHYLENE GLYCOL 3350 17 G PO PACK: 17.0000 g | PACK | Freq: Every day | ORAL | 0 refills | Status: AC

## 2024-12-10 NOTE — Discharge Instructions (Addendum)
" °  1. Slow transit constipation (Primary) - DG Abd 1 View x-ray completed in UC shows moderate stool burden, no obvious abnormal gas pattern consistent with obstruction.  Radiology review of imaging has not been completed, if any abnormal findings are reported appropriate treatment will be provided. - polyethylene glycol (MIRALAX ) 17 g packet; Take 17 g by mouth daily.  Dispense: 14 each; Refill: 0 - Do not begin treatment with MiraLAX  until x-ray result is completed.  If you do not hear back from the clinic by tomorrow afternoon regarding the x-ray results you may begin treatment with MiraLAX .  2. Hemorrhoids, unspecified hemorrhoid type - hydrocortisone -pramoxine (PROCTOFOAM-HC) rectal foam; Place 1 applicator rectally 2 (two) times daily.  Dispense: 10 g; Refill: 1  -Continue to monitor symptoms for any change in severity if there is any escalation of current symptoms or development of new symptoms follow-up in ER for further evaluation and management. "

## 2024-12-10 NOTE — Telephone Encounter (Signed)
 FYI Only or Action Required?: FYI only for provider: ED advised.  Patient was last seen in primary care on 11/20/2024 by Marylu Gee, DO.  Called Nurse Triage reporting Rectal Bleeding.  Symptoms began several days ago.  Interventions attempted: Other: n/a.  Symptoms are: stable.  Triage Disposition: Go to ED Now (Notify PCP)  Patient/caregiver understands and will follow disposition?: Yes  Reason for Disposition  Severe dizziness (e.g., unable to stand, requires support to walk, feels like passing out now)  Answer Assessment - Initial Assessment Questions Patient reports this weekend blood from rectum not period. Bright red blood. A lot but not filling up toliet  with blood. Feeling very weak able to stand but feeling near passing out.  This RN advised ER now for evaluation patient is agreeable and will have her daughter take her, on this call patient confirmed with family member taking her to ER now.      1. APPEARANCE of BLOOD: What color is it? Is it passed separately, on the surface of the stool, or mixed in with the stool?      Bright red  2. AMOUNT: How much blood was passed?      A lot bright red, not filling up toilet with red blood 3. FREQUENCY: How many times has blood been passed with the stools?      more than once , statesa lot  4. ONSET: When was the blood first seen in the stools? (Days or weeks)      This weekend rectal bleeding , multiple occurrences since it started  5. DIARRHEA: Is there also some diarrhea? If Yes, ask: How many diarrhea stools in the past 24 hours?      Did not mention  6. CONSTIPATION: Do you have constipation? If Yes, ask: How bad is it?     Yes  7. RECURRENT SYMPTOMS: Have you had blood in your stools before? If Yes, ask: When was the last time? and What happened that time?      Has not had this before  8. BLOOD THINNERS: Do you take any blood thinners? (e.g., aspirin , clopidogrel / Plavix, coumadin,  heparin ). Notes: Other strong blood thinners include: Arixtra (fondaparinux), Eliquis  (apixaban ), Pradaxa (dabigatran), and Xarelto  (rivaroxaban ).     Yes on blood thinner hasn't taken today hasn't been filled in pill pack pt unaware why 9. OTHER SYMPTOMS: Do you have any other symptoms?  (e.g., abdomen pain, vomiting, dizziness, fever)     Feeling very weak, dizzy when standing able to stand   Patient denies the following chest pain, shortness of breath, syncope  Protocols used: Rectal Bleeding-A-AH  Copied from CRM #[id number]. Topic: Clinical - Red Word Triage >> Dec 10, 2024 11:54 AM Farrel B wrote: [facility] that prompted transfer to Nurse Triage: rectal bleeding, stomach pain, stomach feeling full and unable to eat, constipation.

## 2024-12-10 NOTE — Telephone Encounter (Signed)
 Call to patient .  Is on the way to the ER now.  Stressed the importance to patient to make sure and get to the ER as soon as possible.

## 2024-12-10 NOTE — ED Provider Notes (Signed)
 " [facility]  Note:  This document was prepared using Dragon voice recognition software and may include unintentional dictation errors.  MRN: [id number] DOB: 1966-04-13  Subjective:   [name] is a 58 y.o. female presenting for rectal bleeding x 2 to 3 days.  Patient denies any significant rectal pain.  Patient believes she may be constipated had a small bowel movement yesterday however before that she had not had a good bowel movement in approximately 1 week.  Patient states that she is taking stool softener and Linzess  but is still having constipation issues.  Patient reports that blood with bowel movement yesterday it was bright red.  Patient concern for hemorrhoids due to chronic constipation.  Current Medications[1]   Allergies[2]  Past Medical History:  Diagnosis Date   Acute cystitis 06/19/2021   AKI (acute kidney injury) 09/13/2022   AKI (acute kidney injury) 09/13/2022   Allergic rhinitis    Arthritis    hips, back, legs, arms (07/04/2014)   Asthma    hx   Automatic implantable cardioverter-defibrillator in situ    Calcifying tendinitis of shoulder    Chronic combined systolic and diastolic CHF (congestive heart failure) (HCC)    EF 40-45% by echo 12/06/2012   Chronic diastolic heart failure (HCC)     Primarily diastolic CHF: Likely due to uncontrolled HTN. Last echo (8/12) with EF 45-50%, mild to moderate LVH with some asymmetric septal hypertrophy, RV normal size and systolic function. EF 50-55% by LV-gram in 6/12.    Chronic lower back pain    secondary to DJD, obsetiy, hip problems. Followed by Dr. Darlyn Gunther (pain management)   Chronic systolic heart failure (HCC) 05/17/2022   Chronic use of opiate for therapeutic purpose 12/14/2016   Contact with and (suspected) exposure to covid-19 02/07/2020   Coronary artery disease    questionable. LHC 05/2011 showing normal coronaries // Followed at [facility], Dr. Rolan   Cough 11/02/2022    Degeneration of lumbar or lumbosacral intervertebral disc    Diabetic foot infection (HCC) 03/05/2023   DJD (degenerative joint disease) of hip    right sided   Fatigue 04/27/2022   Frequent UTI    GERD (gastroesophageal reflux disease)    HLD (hyperlipidemia)    Hypertension    Poorly controlled. Has had HTN since age 35. Angioedema with ACEI.  24 Hr urine and renal arterial dopplers ordered . . . 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   Presence of permanent cardiac pacemaker    Rash 07/14/2016   Seizures (HCC)    last 3 months   Shortness of breath    none now   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 (07/04/2014)   Tachycardia 07/04/2014   Thoracic or lumbosacral neuritis or radiculitis, unspecified    Tinea cruris 09/13/2022   Type II diabetes mellitus (HCC) DX: 2002  Urinary frequency 11/05/2014   Vaginal discharge 02/05/2021     Past Surgical History:  Procedure Laterality Date   AMPUTATION Right 05/18/2023   Procedure: RIGHT FOTT 5TH TOE AMPUTATION;  Surgeon: Harden Jerona GAILS, MD;  Location: [facility] OR;  Service: Orthopedics;  Laterality: Right;   BI-VENTRICULAR IMPLANTABLE CARDIOVERTER DEFIBRILLATOR N/A 08/22/2013   Procedure: BI-VENTRICULAR IMPLANTABLE CARDIOVERTER DEFIBRILLATOR  (CRT-D);  Surgeon: Elspeth JAYSON Sage, MD;  [facility];  Service: Cardiovascular;  Laterality: N/A;   BI-VENTRICULAR IMPLANTABLE CARDIOVERTER  DEFIBRILLATOR  (CRT-D)  08/2013   thelbert 08/23/2013   BIV ICD GENERATOR CHANGEOUT N/A 03/18/2021   Procedure: BIV ICD GENERATOR CHANGEOUT;  Surgeon: Sage Elspeth JAYSON, MD;  Location: [facility] INVASIVE CV LAB;  Service: Cardiovascular;  Laterality: N/A;   BREAST SURGERY Bilateral 2011   patient reports benign results   CARDIAC CATHETERIZATION  05/2011   CARPAL TUNNEL RELEASE Left    denies   HEMIARTHROPLASTY SHOULDER FRACTURE Right 1980's   denies   I & D EXTREMITY Right 03/06/2023   Procedure: IRRIGATION AND DEBRIDEMENT OF RIGHT FOOT;  Surgeon: Beverley Evalene BIRCH, MD;  Location: [facility] OR;  Service: Orthopedics;  Laterality: Right;   I & D EXTREMITY Right 03/10/2023   Procedure: RIGHT FOOT INCISION AND DEBRIDEMENT;  Surgeon: Beverley Evalene BIRCH, MD;  Location: [facility] OR;  Service: Orthopedics;  Laterality: Right;   MULTIPLE EXTRACTIONS WITH ALVEOLOPLASTY Bilateral 05/20/2017   Procedure: MULTIPLE EXTRACTION;  Surgeon: Sheryle Hamilton, DDS;  Location: [facility] OR;  Service: Oral Surgery;  Laterality: Bilateral;   MULTIPLE TOOTH EXTRACTIONS  ~ 2011   tumors removed ; my whole top   SHOULDER ARTHROSCOPY Right 12/26/2015   Procedure: Right Shoulder Arthroscopy, Debridement, and Decompression;  Surgeon: Jerona Harden GAILS, MD;  Location: [facility] OR;  Service: Orthopedics;  Laterality: Right;   TEE WITHOUT CARDIOVERSION N/A 01/14/2014   Procedure: TRANSESOPHAGEAL ECHOCARDIOGRAM (TEE);  Surgeon: Jerel Balding, MD;  Location: [facility] ENDOSCOPY;  Service: Cardiovascular;  Laterality: N/A;   TUBAL LIGATION  05/31/1985    Family History  Problem Relation Age of Onset   Heart disease Mother 3       Died of MI at age 28 yo   Kidney disease Mother        requiring dialysis   Congestive Heart Failure Mother    Heart disease Father 56       MI age 21yo requiring stenting   Diabetes Father    Glaucoma Father    Heart disease Paternal Grandmother        requiring pacemaker.   Heart disease Paternal Grandfather 20       Died of MI at possibly  age 38-53yo   Stroke Paternal Grandfather    Diabetes Brother    Heart disease Brother 17       MI at age 29 years old   Breast cancer Paternal Aunt    Breast cancer Maternal Grandmother     Social History[3]  ROS Refer to HPI for ROS details.  Objective:    Vitals: BP (!) 150/90   Pulse (!) 109   Temp (!) 97.4 F (36.3 C)   Resp 18   LMP 09/13/2019   SpO2 92%   Physical Exam Vitals and nursing note reviewed.  Constitutional:      General: She is not in acute distress.    Appearance: Normal appearance. She is well-developed. She is not ill-appearing or toxic-appearing.  HENT:     Head: Normocephalic and atraumatic.  Cardiovascular:  Rate and Rhythm: Normal rate.  Pulmonary:     Effort: Pulmonary effort is normal. No respiratory distress.     Breath sounds: No stridor. No wheezing.  Abdominal:     General: There is distension.     Tenderness: There is abdominal tenderness (Generalized abdominal tenderness with palpation most likely secondary to constipation.). There is no right CVA tenderness, left CVA tenderness, guarding or rebound.  Skin:    General: Skin is warm and dry.  Neurological:     General: No focal deficit present.     Mental Status: She is alert and oriented to person, place, and time.  Psychiatric:        Mood and Affect: Mood normal.        Behavior: Behavior normal.     Procedures  No results found. However, due to the size of the patient record, not all encounters were searched. Please check Results Review for a complete set of results.  Assessment and Plan :     Discharge Instructions       1. Slow transit constipation (Primary) - DG Abd 1 View x-ray completed in UC shows moderate stool burden, no obvious abnormal gas pattern consistent with obstruction.  Radiology review of imaging has not been completed, if any abnormal findings are reported appropriate treatment will be provided. - polyethylene glycol (MIRALAX ) 17 g packet;  Take 17 g by mouth daily.  Dispense: 14 each; Refill: 0 - Do not begin treatment with MiraLAX  until x-ray result is completed.  If you do not hear back from the clinic by tomorrow afternoon regarding the x-ray results you may begin treatment with MiraLAX .  2. Hemorrhoids, unspecified hemorrhoid type - hydrocortisone -pramoxine (PROCTOFOAM-HC) rectal foam; Place 1 applicator rectally 2 (two) times daily.  Dispense: 10 g; Refill: 1  -Continue to monitor symptoms for any change in severity if there is any escalation of current symptoms or development of new symptoms follow-up in ER for further evaluation and management.      Gahel Safley B Christne Platts    [1] No current facility-administered medications for this encounter.  Current Outpatient Medications:    hydrocortisone -pramoxine (PROCTOFOAM-HC) rectal foam, Place 1 applicator rectally 2 (two) times daily., Disp: 10 g, Rfl: 1   polyethylene glycol (MIRALAX ) 17 g packet, Take 17 g by mouth daily., Disp: 14 each, Rfl: 0   Accu-Chek Softclix Lancets lancets, Use to check blood sugar as directed, Disp: 102 each, Rfl: 12   albuterol  (VENTOLIN  HFA) 108 (90 Base) MCG/ACT inhaler, Inhale 2 puffs into the lungs every 4 (four) hours as needed for wheezing or shortness of breath., Disp: 36 g, Rfl: 0   apixaban  (ELIQUIS ) 5 MG TABS tablet, Take 1 tablet (5 mg total) by mouth 2 (two) times daily., Disp: 120 tablet, Rfl: 5   Blood Glucose Monitoring Suppl (ACCU-CHEK GUIDE) w/Device KIT, Use As Directed, Disp: 1 kit, Rfl: 0   Continuous Glucose Receiver (FREESTYLE LIBRE 3 READER) DEVI, Use with freestyle libre 3 plus sensors to monitor blood sugar continuously, Disp: 1 each, Rfl: 1   Continuous Glucose Sensor (FREESTYLE LIBRE 3 PLUS SENSOR) MISC, Change sensor every 15 days., Disp: 6 each, Rfl: 3   cyanocobalamin  1000 MCG tablet, Take 1 tablet by mouth daily., Disp: , Rfl:    cyclobenzaprine  (FLEXERIL ) 10 MG tablet, Take 1 tablet (10 mg total) by mouth 2 (two)  times daily as needed for muscle spasms., Disp: 60 tablet, Rfl: 1   diclofenac  Sodium (VOLTAREN ) 1 % GEL, Apply 4 g  topically 4 (four) times daily as needed (Pain). Left knee, Disp: 100 g, Rfl: 1   Dulaglutide  (TRULICITY ) 0.75 MG/0.5ML SOAJ, Inject 0.75 mg into the skin once a week., Disp: 2 mL, Rfl: 3   escitalopram  (LEXAPRO ) 10 MG tablet, Take 1 tablet (10 mg total) by mouth daily., Disp: 90 tablet, Rfl: 3   famotidine  (PEPCID ) 20 MG tablet, Take 1 tablet (20 mg total) by mouth 2 (two) times daily., Disp: 60 tablet, Rfl: 3   feeding supplement (ENSURE ENLIVE / ENSURE PLUS) LIQD, Take 237 mLs by mouth 2 (two) times daily between meals., Disp: , Rfl:    gabapentin  (NEURONTIN ) 300 MG capsule, Take 1 capsule (300 mg total) by mouth 3 (three) times daily., Disp: 180 capsule, Rfl: 5   glucose blood (ACCU-CHEK GUIDE TEST) test strip, Use as instructed, Disp: 100 each, Rfl: 12   GNP ULTICARE PEN NEEDLES 31G X 8 MM MISC, Inject 1 each into the skin 4 (four) times daily., Disp: 200 each, Rfl: 0   insulin  aspart (NOVOLOG ) 100 UNIT/ML FlexPen, Inject 8 Units into the skin 3 (three) times daily with meals., Disp: 3 mL, Rfl: 30   Insulin  Glargine (BASAGLAR  KWIKPEN) 100 UNIT/ML, Inject 56 Units into the skin daily., Disp: 15 mL, Rfl: 11   isosorbide -hydrALAZINE  (BIDIL ) 20-37.5 MG tablet, Take 1 tablet by mouth 3 (three) times daily., Disp: 90 tablet, Rfl: 11   Lacosamide  (VIMPAT ) 100 MG TABS, Take 2 tablets (200 mg total) by mouth 2 (two) times daily., Disp: 120 tablet, Rfl: 11   lacosamide  (VIMPAT ) 200 MG TABS tablet, Take 1 tablet (200 mg total) by mouth 2 (two) times daily., Disp: 180 tablet, Rfl: 3   levETIRAcetam  (KEPPRA ) 500 MG tablet, Take 1 tablet (500 mg total) by mouth 2 (two) times daily., Disp: 180 tablet, Rfl: 3   linaclotide  (LINZESS ) 290 MCG CAPS capsule, Take 1 capsule (290 mcg total) by mouth daily before breakfast., Disp: 30 capsule, Rfl: 3   melatonin 3 MG TABS tablet, Take 3 mg by mouth at  bedtime., Disp: , Rfl:    metoprolol  succinate (TOPROL -XL) 100 MG 24 hr tablet, Take 1 tablet (100 mg total) by mouth daily. Take with or immediately following a meal., Disp: 30 tablet, Rfl: 2   nutrition supplement, JUVEN, (JUVEN) PACK, Take 1 packet by mouth 2 (two) times daily between meals., Disp: , Rfl:    Nystatin  (GERHARDT'S BUTT CREAM) CREA, Apply 1 Application topically 2 (two) times daily., Disp: , Rfl:    pantoprazole  (PROTONIX ) 40 MG tablet, Take 1 tablet (40 mg total) by mouth daily., Disp: 90 tablet, Rfl: 3   polyethylene glycol (MIRALAX  / GLYCOLAX ) 17 g packet, Take 17 g by mouth daily as needed., Disp: , Rfl:    PREMARIN  vaginal cream, Place 0.5 (1gram) Applicatorfuls vaginally 3 (three) times a week. ** This is 70 DAYS supply **, Disp: 30 g, Rfl: 2   psyllium (HYDROCIL/METAMUCIL) 95 % PACK, Take 1 packet by mouth daily., Disp: , Rfl:    rosuvastatin  (CRESTOR ) 40 MG tablet, Take 1 tablet (40 mg total) by mouth daily., Disp: 90 tablet, Rfl: 3   senna (SENOKOT) 8.6 MG TABS tablet, Take 1 tablet (8.6 mg total) by mouth 2 (two) times daily as needed for moderate constipation., Disp: , Rfl:    spironolactone  (ALDACTONE ) 25 MG tablet, Take 1 tablet (25 mg total) by mouth daily., Disp: 90 tablet, Rfl: 3   talc  powder, Apply topically as needed., Disp: 240 g, Rfl: 0  Vitamin D , Ergocalciferol , (DRISDOL ) 1.25 MG (50000 UNIT) CAPS capsule, Take 1 capsule (50,000 Units total) by mouth every 7 (seven) days., Disp: 8 capsule, Rfl: 0   Zinc  Sulfate 220 (50 Zn) MG TABS, Take 1 tablet (220 mg total) by mouth daily., Disp: 100 tablet, Rfl: 0 [2]  Allergies Allergen Reactions   Ace Inhibitors Anaphylaxis and Other (See Comments)    Swelling of the tongue and throat    Cleocin [Clindamycin] Anaphylaxis and Swelling   Enalapril Maleate Anaphylaxis and Other (See Comments)    Swelling of the tongue and throat    Zestril [Lisinopril] Anaphylaxis and Other (See Comments)    Swelling of the tongue  and throat   [3]  Social History Tobacco Use   Smoking status: Never    Passive exposure: Current   Smokeless tobacco: Never  Vaping Use   Vaping status: Never Used  Substance Use Topics   Alcohol  use: No    Alcohol /week: 0.0 standard drinks of alcohol    Drug use: No     Aurea Goodell B, NP 12/10/24 1821  "

## 2024-12-10 NOTE — ED Triage Notes (Signed)
 Pt presents with complaints of light red bleeding from her rectum x 3 days with minimal pain. Pt reports being constipated with only small amounts of stool. Has been 1 week since her last normal BM.

## 2024-12-11 ENCOUNTER — Inpatient Hospital Stay: Payer: Self-pay | Admitting: Student

## 2024-12-11 ENCOUNTER — Ambulatory Visit (HOSPITAL_COMMUNITY): Payer: Self-pay

## 2024-12-11 NOTE — Telephone Encounter (Signed)
 Call x2; home/cell - vm box full; lvmtcb w/ friend Leslee, to schedule patient for overdue 1 yr f/u (due 09/2023); ICD/MDT + to get reconnect monitor.

## 2024-12-14 NOTE — Telephone Encounter (Signed)
 Call x3; home/cell - vm box full; lvmtcb w/ friend Leslee, to schedule patient for overdue 1 yr f/u (due 09/2023); ICD/MDT + to get reconnect monitor.

## 2024-12-17 ENCOUNTER — Telehealth: Payer: Self-pay | Admitting: *Deleted

## 2024-12-17 NOTE — Telephone Encounter (Signed)
 Patient called back. States Supplies were denied because they said she was in a nursing home. Patient states she is not in a nursing home. Needs pads and supplies sent to [facility] so she can get her supplies.

## 2024-12-17 NOTE — Telephone Encounter (Signed)
 Will forward to C. Shirlean and PCP.              Copied from CRM [id number]. Topic: General - Other >> Dec 17, 2024 11:02 AM [name] wrote: Reason for CRM: Patient states that she needs her medical incontinence supplies to be sent (gloves, briefs, wipes) to her urologist (patient does not remember the name of facility but states [facility] has it) She states she was denied once due to being inside a facility that helps her but she states she is not part of any facility

## 2024-12-18 ENCOUNTER — Other Ambulatory Visit: Payer: Self-pay | Admitting: Student

## 2024-12-18 ENCOUNTER — Encounter: Payer: Self-pay | Admitting: Emergency Medicine

## 2024-12-18 NOTE — Telephone Encounter (Signed)
 Certified letter mailed to patient after 3 failed attempts at trying to reach patient.

## 2024-12-24 ENCOUNTER — Telehealth: Payer: Self-pay | Admitting: Dietician

## 2024-12-24 NOTE — Telephone Encounter (Signed)
 Confirmed she is still using Continuous glucose monitoring. Scheduled appointment follow up for tomorrow

## 2024-12-25 ENCOUNTER — Ambulatory Visit: Payer: Self-pay | Admitting: Dietician

## 2024-12-25 ENCOUNTER — Ambulatory Visit: Payer: Self-pay | Admitting: Student

## 2024-12-25 NOTE — Progress Notes (Unsigned)
 Last visit 11/20/2024 Poorly controlled type 2 diabetes mellitus with neuropathy (HCC) A1c 12/2 increased 9.0 > 11.8. At that time Novolog  8 units TID was added. Since then at home readings have been better with the highest she has seen in low 200's with no episodes of hypoglycemia. Also managed with Glargine 56 units daily. Previously intolerant to Metformin  and SGLT2-I discontinued due to recurrent UTI's though I wonder if this was more related to her poorly controlled diabetes. Will attempt resumption once A1c improves.     Atrial fibrillation (HCC) NSR today.  Denies palpitations/SOB.  Continue Toprol -XL 100 and Eliquis . She has been in contact with [facility] to re-[facility] care and is waiting for appointment time.    Benign essential HTN BP is 167/113 and 159/98.  Managed with BiDil  20-37.5 daily, Toprol -XL 100 mg daily, and Aldactone  25 mg daily (the latter two she is on for HFrEF 25-30%).  She states readings at home are consistently in 140s-150s.  Denies HA, visual changes, chest pain.  She will be seeing cardiology soon and given complicated comorbidities, will defer management for now but she is instructed to call by the end of the week if a specific appointment time is not established.   Chronic combined systolic and diastolic heart failure (HCC) TTE in 2023 with EF of 25-30%.  Managed with Toprol -XL 100 mg daily and Aldactone  25 mg daily.  No SGLT2-I per above.  No Entresto given history of angioedema. Denies orthopnea, SOB, leg swelling. Follow-up with Cardiology.   Vitamin D  deficiency Low last week at 23.2. She is waiting on [facility] to send her 50,000 units weekly for 8 weeks.     CC: {Clinic Visit Type:31040}  HPI:  [name] is a 59 y.o. female with pertinent PMH of hypertension, atrial fibrillation on Eliquis , HFrEF 2/2 NICM, OSA, type 2 diabetes, prior CVA with left-sided deficits and seizures, hyperlipidemia, obesity, PTSD, and vitamin D  deficiency who presents as  above. Please see assessment and plan below for further details.  Medications: Current Outpatient Medications  Medication Instructions   Accu-Chek Softclix Lancets lancets Use to check blood sugar as directed   albuterol  (VENTOLIN  HFA) 108 (90 Base) MCG/ACT inhaler 2 puffs, Inhalation, Every 4 hours PRN   apixaban  (ELIQUIS ) 5 mg, Oral, 2 times daily   Basaglar  KwikPen 56 Units, Subcutaneous, Daily   Blood Glucose Monitoring Suppl (ACCU-CHEK GUIDE) w/Device KIT Use As Directed   Continuous Glucose Receiver (FREESTYLE LIBRE 3 READER) DEVI Use with freestyle libre 3 plus sensors to monitor blood sugar continuously   Continuous Glucose Sensor (FREESTYLE LIBRE 3 PLUS SENSOR) MISC Change sensor every 15 days.   cyanocobalamin  1000 MCG tablet 1 tablet, Daily   cyclobenzaprine  (FLEXERIL ) 10 MG tablet TAKE ONE TABLET (10mg  total) BY MOUTH TWICE DAILY AS NEEDED muscle SPASMS   diclofenac  Sodium (VOLTAREN ) 4 g, Topical, 4 times daily PRN, Left knee   escitalopram  (LEXAPRO ) 10 mg, Oral, Daily   famotidine  (PEPCID ) 20 mg, Oral, 2 times daily   feeding supplement (ENSURE ENLIVE / ENSURE PLUS) LIQD 237 mLs, Oral, 2 times daily between meals   gabapentin  (NEURONTIN ) 300 mg, Oral, 3 times daily   glucose blood (ACCU-CHEK GUIDE TEST) test strip Use as instructed   GNP ULTICARE PEN NEEDLES 31G X 8 MM MISC 1 each, Subcutaneous, 4 times daily   hydrocortisone -pramoxine (PROCTOFOAM-HC) rectal foam 1 applicator, Rectal, 2 times daily   insulin  aspart (NOVOLOG ) 8 Units, Subcutaneous, 3 times daily with meals   isosorbide -hydrALAZINE  (BIDIL ) 20-37.5 MG  tablet 1 tablet, Oral, 3 times daily   lacosamide  (VIMPAT ) 200 mg, Oral, 2 times daily   Lacosamide  (VIMPAT ) 200 mg, Oral, 2 times daily   levETIRAcetam  (KEPPRA ) 500 mg, Oral, 2 times daily   LINZESS  290 MCG CAPS capsule Take 1 capsule (290 mcg total) by mouth daily before breakfast.   melatonin 3 mg, Daily at bedtime   metoprolol  succinate (TOPROL -XL) 100 mg,  Oral, Daily, Take with or immediately following a meal.   nutrition supplement, JUVEN, (JUVEN) PACK 1 packet, Oral, 2 times daily between meals   Nystatin  (GERHARDT'S BUTT CREAM) CREA 1 Application, Topical, 2 times daily   pantoprazole  (PROTONIX ) 40 mg, Oral, Daily   polyethylene glycol (MIRALAX  / GLYCOLAX ) 17 g, Oral, Daily PRN   polyethylene glycol (MIRALAX ) 17 g, Oral, Daily   PREMARIN  vaginal cream Place 0.5 (1gram) Applicatorfuls vaginally 3 (three) times a week. ** This is 70 DAYS supply **   psyllium (HYDROCIL/METAMUCIL) 95 % PACK 1 packet, Oral, Daily   rosuvastatin  (CRESTOR ) 40 mg, Oral, Daily   senna (SENOKOT) 8.6 mg, Oral, 2 times daily PRN   spironolactone  (ALDACTONE ) 25 mg, Oral, Daily   talc  powder Topical, As needed   Trulicity  0.75 mg, Subcutaneous, Weekly   Vitamin D  (Ergocalciferol ) (DRISDOL ) 50,000 Units, Oral, Every 7 days   Zinc  Sulfate 220 mg, Oral, Daily     Review of Systems:   Pertinent items noted in HPI and/or A&P.  Physical Exam:  There were no vitals filed for this visit.  Constitutional:***. In no acute distress. HEENT: Normocephalic, atraumatic, Sclera non-icteric, PERRL, EOM intact Cardio:Regular rate and rhythm. 2+ bilateral {PulseLoc:28294} pulses. Pulm:Clear to auscultation bilaterally. Normal work of breathing on room air. Abdomen: Soft, non-tender, non-distended, positive bowel sounds. FDX:Wzhjupcz for extremity edema. Skin:Warm and dry. Neuro:Alert and oriented x3. No focal deficit noted. Psych:Pleasant mood and affect.   Assessment & Plan:   Assessment & Plan   No orders of the defined types were placed in this encounter.    No follow-ups on file.   Patient {GC/GE:[id number]::discussed with,seen with} {JGIMTSattending2025/2026:32954}  Fairy Pool, DO [facility] Resident PGY-3 Clinic Phone: [phone] Please contact the on call pager at [phone] for any urgent or emergent needs.

## 2024-12-25 NOTE — Telephone Encounter (Signed)
 Jarrell Sabin, Nurse) checking to make sure patient appointment schedule on 01/08/25 included cognitive evaluation. The purpose to get her a neuropsychological evaluation to determine if patient able to live independently in the community.

## 2024-12-25 NOTE — Telephone Encounter (Signed)
 Rtn call to pt.  No answer. VM full.  Appt has been cancelled.  Copied from CRM [id number]. Topic: Appointments - Scheduling Inquiry for Clinic >> Dec 25, 2024  1:22 PM Farrel B wrote: Reason for CRM: Pt is needing to reschedule appt for [name] please call and advise  Mobile [phone]

## 2024-12-25 NOTE — Telephone Encounter (Signed)
" °  Rtn call to pt.  No answer. VM full.  Appt has been cancelled.    Copied from CRM [id number]. Topic: Appointments - Appointment Scheduling >> Dec 25, 2024  1:24 PM Farrel B wrote: Patient was  calling to schedule an appointment. Refer to attachments for appointment information.  However, in the midst of answering questions on the decision tree, the pt decided to hang up the phone without completing the scheduling. Please call pt to advise   Mobile [phone] "

## 2024-12-27 ENCOUNTER — Telehealth: Payer: Self-pay | Admitting: *Deleted

## 2024-12-27 NOTE — Telephone Encounter (Signed)
 Will forward to C. Boone.                    Copied from CRM #[id number]. Topic: Clinical - Order For Equipment >> Dec 27, 2024  7:57 AM Marda MATSU wrote: Reason for CRM: Please call Pt Mazzoni, regarding incontinence supplies. She also mentioned, please let her PCP know and staff that she is not in a nursing facility, she is in her own home.   Please advise

## 2025-01-01 ENCOUNTER — Encounter

## 2025-01-02 ENCOUNTER — Ambulatory Visit: Payer: Self-pay

## 2025-01-02 NOTE — Telephone Encounter (Signed)
 Patient returned call after receiving certified letter. She is scheduled to see EP APP on 3/3.

## 2025-01-02 NOTE — Telephone Encounter (Signed)
 Unable to speak w/ patient regarding remote monitoring. Uunable to leave voicemail. Patient called scheduler back and F/U appointment w/ Artist Pouch scheduled for 02/12/2025 @ 2:15pm.   Patient monitor not connected. Last transmission received on 07/27/2023. Need to ensure patient has monitor and is connected.   Will F/U w/ patient at upcoming OV visit.

## 2025-01-08 ENCOUNTER — Encounter: Payer: Self-pay | Admitting: Neurology

## 2025-01-08 ENCOUNTER — Ambulatory Visit: Payer: MEDICAID | Admitting: Neurology

## 2025-01-08 VITALS — BP 129/80 | HR 96 | Ht 69.0 in

## 2025-01-08 DIAGNOSIS — Z5181 Encounter for therapeutic drug level monitoring: Secondary | ICD-10-CM | POA: Diagnosis not present

## 2025-01-08 DIAGNOSIS — Z8673 Personal history of transient ischemic attack (TIA), and cerebral infarction without residual deficits: Secondary | ICD-10-CM

## 2025-01-08 DIAGNOSIS — G40909 Epilepsy, unspecified, not intractable, without status epilepticus: Secondary | ICD-10-CM

## 2025-01-08 DIAGNOSIS — G40209 Localization-related (focal) (partial) symptomatic epilepsy and epileptic syndromes with complex partial seizures, not intractable, without status epilepticus: Secondary | ICD-10-CM | POA: Diagnosis not present

## 2025-01-08 DIAGNOSIS — I69398 Other sequelae of cerebral infarction: Secondary | ICD-10-CM | POA: Diagnosis not present

## 2025-01-08 DIAGNOSIS — R4189 Other symptoms and signs involving cognitive functions and awareness: Secondary | ICD-10-CM

## 2025-01-08 DIAGNOSIS — Z8679 Personal history of other diseases of the circulatory system: Secondary | ICD-10-CM

## 2025-01-08 MED ORDER — LEVETIRACETAM 500 MG PO TABS: 500.0000 mg | ORAL_TABLET | Freq: Two times a day (BID) | ORAL | 3 refills | Status: AC

## 2025-01-08 MED ORDER — LACOSAMIDE 200 MG PO TABS: 200.0000 mg | ORAL_TABLET | Freq: Two times a day (BID) | ORAL | 3 refills | Status: AC

## 2025-01-08 NOTE — Patient Instructions (Addendum)
 Continue current medications including levetiracetam  500 mg twice daily and lacosamide  200 mg twice daily Will obtain labs today  MoCA score today was 13/30 will refer patient for formal neuropsychological testing Return in 6 months or sooner if worse

## 2025-01-08 NOTE — Progress Notes (Signed)
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 SUDEP is defined specifically as the sudden, unexpected, witnessed or unwitnessed, nontraumatic and nondrowning death in patients with epilepsy with or without evidence for a seizure, and excluding documented status epilepticus, in which post mortem examination does not reveal a structural or toxicologic cause for death     Orders Placed This Encounter  Procedures   Levetiracetam  level   Lacosamide    Basic Metabolic Panel   Ambulatory referral to Neuropsychology    Meds ordered this encounter  Medications   lacosamide  (VIMPAT ) 200 MG TABS tablet    Sig: Take 1 tablet (200 mg total) by mouth 2 (two) times daily.    Dispense:  180 tablet    Refill:  3   levETIRAcetam  (KEPPRA ) 500 MG tablet    Sig: Take 1 tablet (500 mg total) by mouth 2 (two) times daily.    Dispense:  180 tablet    Refill:  3    Return in about 6 months (around 07/08/2025).   Pastor Falling, MD 01/08/2025, 11:02  AM  [facility] [address] [place], [place] [address] [phone]  "

## 2025-01-09 ENCOUNTER — Telehealth: Payer: Self-pay | Admitting: Neurology

## 2025-01-09 NOTE — Telephone Encounter (Signed)
 Referral for neuropsychology fax to [facility]. Phone: [phone], [phone]

## 2025-01-11 LAB — BASIC METABOLIC PANEL WITH GFR
BUN/Creatinine Ratio: 8 — ABNORMAL LOW (ref 9–23)
BUN: 10 mg/dL (ref 6–24)
CO2: 23 mmol/L (ref 20–29)
Calcium: 9.7 mg/dL (ref 8.7–10.2)
Chloride: 92 mmol/L — ABNORMAL LOW (ref 96–106)
Creatinine, Ser: 1.19 mg/dL — ABNORMAL HIGH (ref 0.57–1.00)
Glucose: 499 mg/dL — ABNORMAL HIGH (ref 70–99)
Potassium: 3.9 mmol/L (ref 3.5–5.2)
Sodium: 132 mmol/L — ABNORMAL LOW (ref 134–144)
eGFR: 53 mL/min/{1.73_m2} — ABNORMAL LOW

## 2025-01-11 LAB — LEVETIRACETAM LEVEL: Levetiracetam Lvl: 13.1 ug/mL (ref 10.0–40.0)

## 2025-01-11 LAB — LACOSAMIDE: Lacosamide: 5.7 ug/mL (ref 5.0–10.0)

## 2025-01-14 ENCOUNTER — Ambulatory Visit: Payer: Self-pay | Admitting: Neurology

## 2025-01-14 ENCOUNTER — Encounter: Payer: Self-pay | Admitting: Gastroenterology

## 2025-01-14 ENCOUNTER — Other Ambulatory Visit: Payer: Self-pay | Admitting: Student

## 2025-01-17 ENCOUNTER — Telehealth: Payer: Self-pay

## 2025-01-17 NOTE — Telephone Encounter (Signed)
-----   Message from Nurse Alan ORN, RN sent at 01/03/2025 10:22 AM EST ----- Regarding: FW: FYI Follow up to ensure gets monitor in 2 weeks ----- Message ----- From: [name] Sent: 01/03/2025  10:14 AM EST To: [facility] Subject: FYI                                            Per my last conversation with [name] she told me her monitor had gotten wet and needed to be replaced. I gave her tech support number but see that a new monitor has not been ordered. I reached out to [facility] today and submitted a request for someone to contact her about getting a new monitor.   Thanks Jacobs

## 2025-01-18 NOTE — Telephone Encounter (Signed)
 Spoke with patient and inquired if she had received the new remote transmitter from [employer] yet  Patient stated that a [employer] technician contacted her on 01/07/25 at 10 am and attempted to troubleshoot issues with her existing device  Once it was determined that it was not longer functional, the technician notified her that a new monitor would be mailed to her that day and would arrive in 10 business days  10 business days from 01/07/25 would be Monday, 01/21/25  This RN requested that patient please call the device clinic [phone]) once she receives it so we can assist her with set up and pairing  Patient verbalized understanding  This RN then told the patient that if we did not hear back from her on Monday, that we would follow up with her on Tuesday to assist with set up  Patient verbalized understanding and was appreciative of the call

## 2025-02-12 ENCOUNTER — Ambulatory Visit: Payer: MEDICAID | Admitting: Cardiology

## 2025-02-14 ENCOUNTER — Ambulatory Visit: Payer: MEDICAID

## 2025-04-02 ENCOUNTER — Encounter

## 2025-07-02 ENCOUNTER — Encounter

## 2025-07-09 ENCOUNTER — Ambulatory Visit: Payer: MEDICAID | Admitting: Neurology

## 2025-10-01 ENCOUNTER — Encounter
